# Patient Record
Sex: Female | Born: 1965 | Race: White | Hispanic: No | Smoking: Former smoker
Health system: Southern US, Community
[De-identification: ages and names within clinical notes are randomized; demographics above are authoritative.]

## PROBLEM LIST (undated history)

## (undated) DIAGNOSIS — I219 Acute myocardial infarction, unspecified: Secondary | ICD-10-CM

## (undated) DIAGNOSIS — R519 Headache, unspecified: Secondary | ICD-10-CM

## (undated) DIAGNOSIS — F32A Depression, unspecified: Secondary | ICD-10-CM

## (undated) DIAGNOSIS — Z992 Dependence on renal dialysis: Secondary | ICD-10-CM

## (undated) DIAGNOSIS — B3781 Candidal esophagitis: Secondary | ICD-10-CM

## (undated) DIAGNOSIS — I639 Cerebral infarction, unspecified: Secondary | ICD-10-CM

## (undated) DIAGNOSIS — T380X5A Adverse effect of glucocorticoids and synthetic analogues, initial encounter: Secondary | ICD-10-CM

## (undated) DIAGNOSIS — I2510 Atherosclerotic heart disease of native coronary artery without angina pectoris: Secondary | ICD-10-CM

## (undated) DIAGNOSIS — I10 Essential (primary) hypertension: Secondary | ICD-10-CM

## (undated) DIAGNOSIS — K219 Gastro-esophageal reflux disease without esophagitis: Secondary | ICD-10-CM

## (undated) DIAGNOSIS — F329 Major depressive disorder, single episode, unspecified: Secondary | ICD-10-CM

## (undated) DIAGNOSIS — J189 Pneumonia, unspecified organism: Secondary | ICD-10-CM

## (undated) DIAGNOSIS — T7840XA Allergy, unspecified, initial encounter: Secondary | ICD-10-CM

## (undated) DIAGNOSIS — I2699 Other pulmonary embolism without acute cor pulmonale: Secondary | ICD-10-CM

## (undated) DIAGNOSIS — E099 Drug or chemical induced diabetes mellitus without complications: Secondary | ICD-10-CM

## (undated) DIAGNOSIS — Z87442 Personal history of urinary calculi: Secondary | ICD-10-CM

## (undated) DIAGNOSIS — D693 Immune thrombocytopenic purpura: Secondary | ICD-10-CM

## (undated) DIAGNOSIS — F419 Anxiety disorder, unspecified: Secondary | ICD-10-CM

## (undated) DIAGNOSIS — K649 Unspecified hemorrhoids: Secondary | ICD-10-CM

## (undated) DIAGNOSIS — M109 Gout, unspecified: Secondary | ICD-10-CM

## (undated) DIAGNOSIS — R51 Headache: Secondary | ICD-10-CM

## (undated) DIAGNOSIS — B370 Candidal stomatitis: Secondary | ICD-10-CM

## (undated) DIAGNOSIS — D126 Benign neoplasm of colon, unspecified: Secondary | ICD-10-CM

## (undated) DIAGNOSIS — R079 Chest pain, unspecified: Secondary | ICD-10-CM

## (undated) DIAGNOSIS — N186 End stage renal disease: Secondary | ICD-10-CM

## (undated) DIAGNOSIS — S37019A Minor contusion of unspecified kidney, initial encounter: Secondary | ICD-10-CM

## (undated) DIAGNOSIS — J45909 Unspecified asthma, uncomplicated: Secondary | ICD-10-CM

## (undated) DIAGNOSIS — N289 Disorder of kidney and ureter, unspecified: Secondary | ICD-10-CM

## (undated) DIAGNOSIS — M1990 Unspecified osteoarthritis, unspecified site: Secondary | ICD-10-CM

## (undated) DIAGNOSIS — I5042 Chronic combined systolic (congestive) and diastolic (congestive) heart failure: Secondary | ICD-10-CM

## (undated) HISTORY — DX: Immune thrombocytopenic purpura: D69.3

## (undated) HISTORY — DX: Disorder of kidney and ureter, unspecified: N28.9

## (undated) HISTORY — DX: Minor contusion of unspecified kidney, initial encounter: S37.019A

## (undated) HISTORY — PX: CARDIAC CATHETERIZATION: SHX172

## (undated) HISTORY — DX: Gout, unspecified: M10.9

## (undated) HISTORY — PX: CATARACT EXTRACTION: SUR2

## (undated) HISTORY — DX: Essential (primary) hypertension: I10

## (undated) HISTORY — DX: Depression, unspecified: F32.A

## (undated) HISTORY — PX: CARPAL TUNNEL RELEASE: SHX101

## (undated) HISTORY — DX: Cerebral infarction, unspecified: I63.9

## (undated) HISTORY — DX: Candidal stomatitis: B37.0

## (undated) HISTORY — DX: Gastro-esophageal reflux disease without esophagitis: K21.9

## (undated) HISTORY — DX: Candidal esophagitis: B37.81

## (undated) HISTORY — DX: Unspecified asthma, uncomplicated: J45.909

## (undated) HISTORY — DX: Allergy, unspecified, initial encounter: T78.40XA

## (undated) HISTORY — DX: Drug or chemical induced diabetes mellitus without complications: E09.9

## (undated) HISTORY — PX: BONE MARROW BIOPSY: SHX199

## (undated) HISTORY — DX: Major depressive disorder, single episode, unspecified: F32.9

## (undated) HISTORY — DX: Unspecified hemorrhoids: K64.9

## (undated) HISTORY — DX: Adverse effect of glucocorticoids and synthetic analogues, initial encounter: T38.0X5A

## (undated) HISTORY — DX: Chronic combined systolic (congestive) and diastolic (congestive) heart failure: I50.42

## (undated) HISTORY — DX: Unspecified osteoarthritis, unspecified site: M19.90

## (undated) HISTORY — DX: Benign neoplasm of colon, unspecified: D12.6

## (undated) SURGERY — Surgical Case
Anesthesia: *Unknown

---

## 2003-01-01 ENCOUNTER — Encounter: Payer: Self-pay | Admitting: Family Medicine

## 2003-01-01 ENCOUNTER — Ambulatory Visit (HOSPITAL_COMMUNITY): Admission: RE | Admit: 2003-01-01 | Discharge: 2003-01-01 | Payer: Self-pay | Admitting: Family Medicine

## 2006-09-28 ENCOUNTER — Emergency Department (HOSPITAL_COMMUNITY): Admission: EM | Admit: 2006-09-28 | Discharge: 2006-09-28 | Payer: Self-pay | Admitting: Emergency Medicine

## 2006-09-28 HISTORY — PX: CHOLECYSTECTOMY: SHX55

## 2006-10-21 ENCOUNTER — Inpatient Hospital Stay (HOSPITAL_COMMUNITY): Admission: EM | Admit: 2006-10-21 | Discharge: 2006-10-23 | Payer: Self-pay | Admitting: Emergency Medicine

## 2006-10-22 ENCOUNTER — Encounter (INDEPENDENT_AMBULATORY_CARE_PROVIDER_SITE_OTHER): Payer: Self-pay | Admitting: *Deleted

## 2008-03-05 IMAGING — CR DG CHEST 2V
2 series · 2 of 2 positions shown · non-contrast
Comparison: none

CLINICAL DATA: Cough. 
 CHEST ? 2 VIEW:

[view not recorded (1 of 2)]
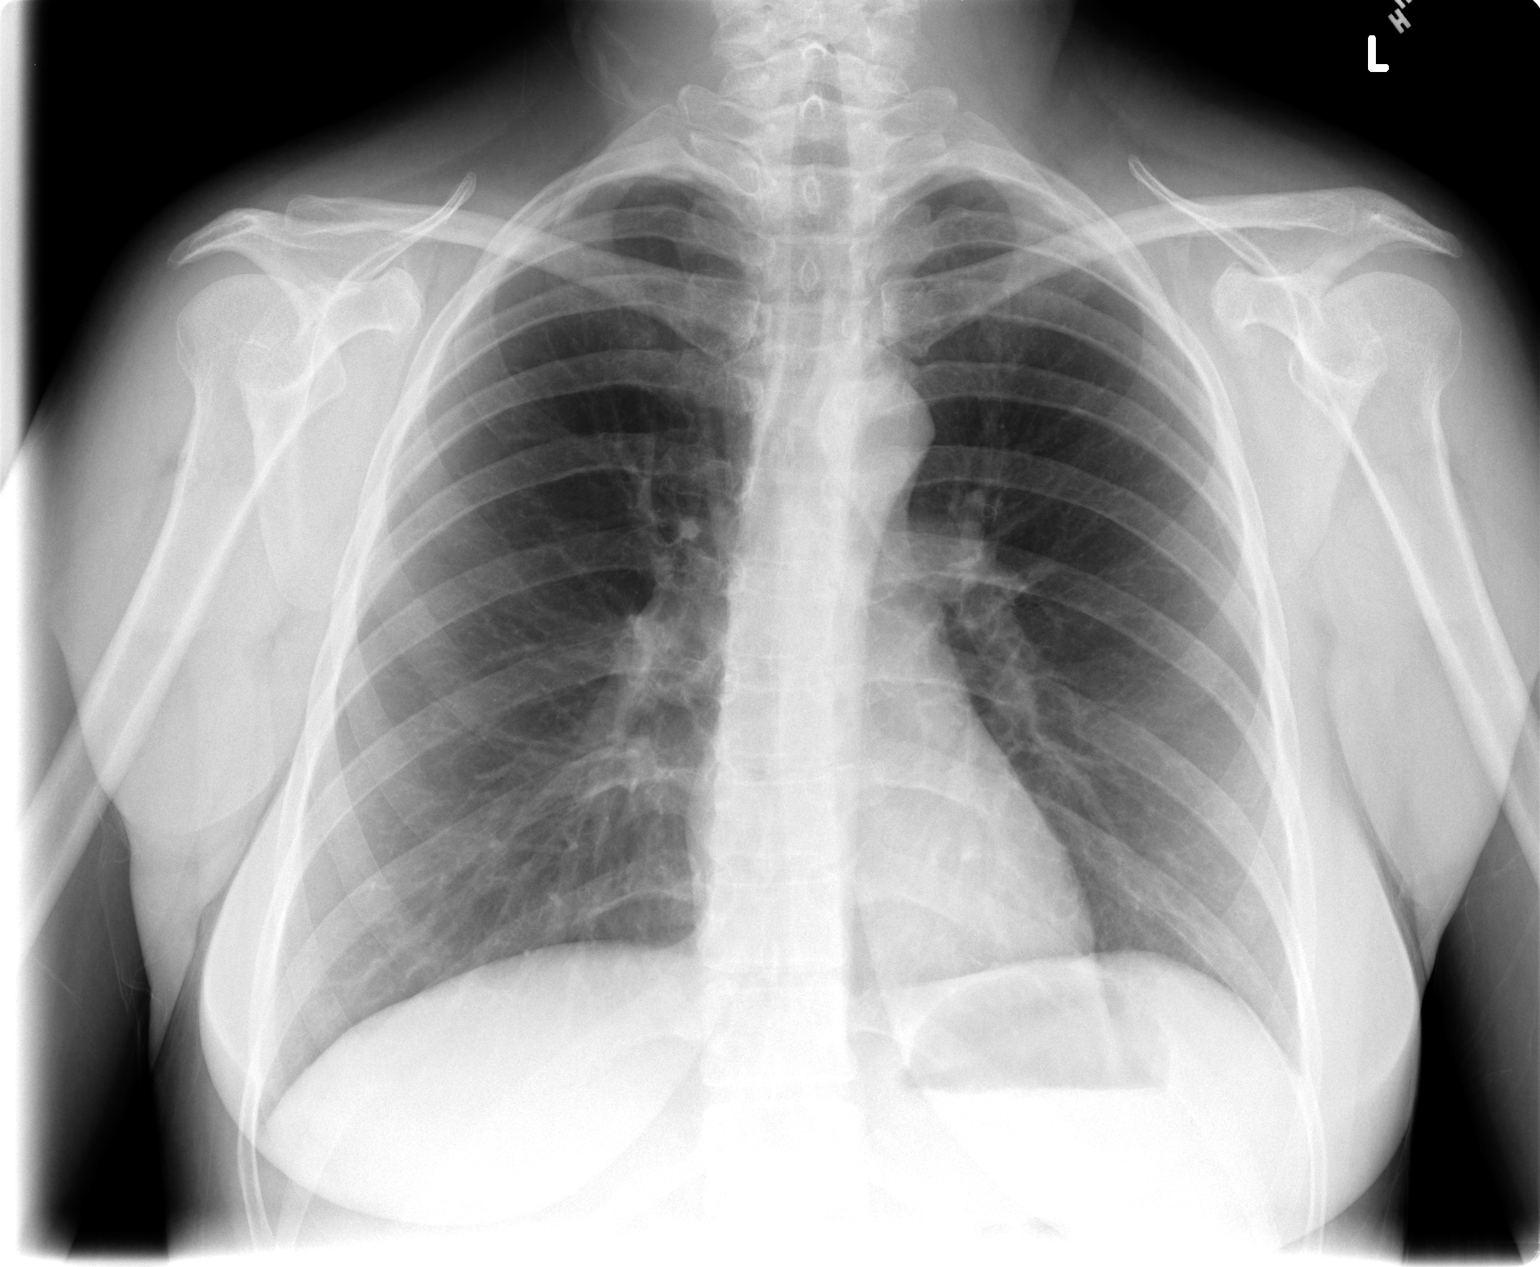

[view not recorded (2 of 2)]
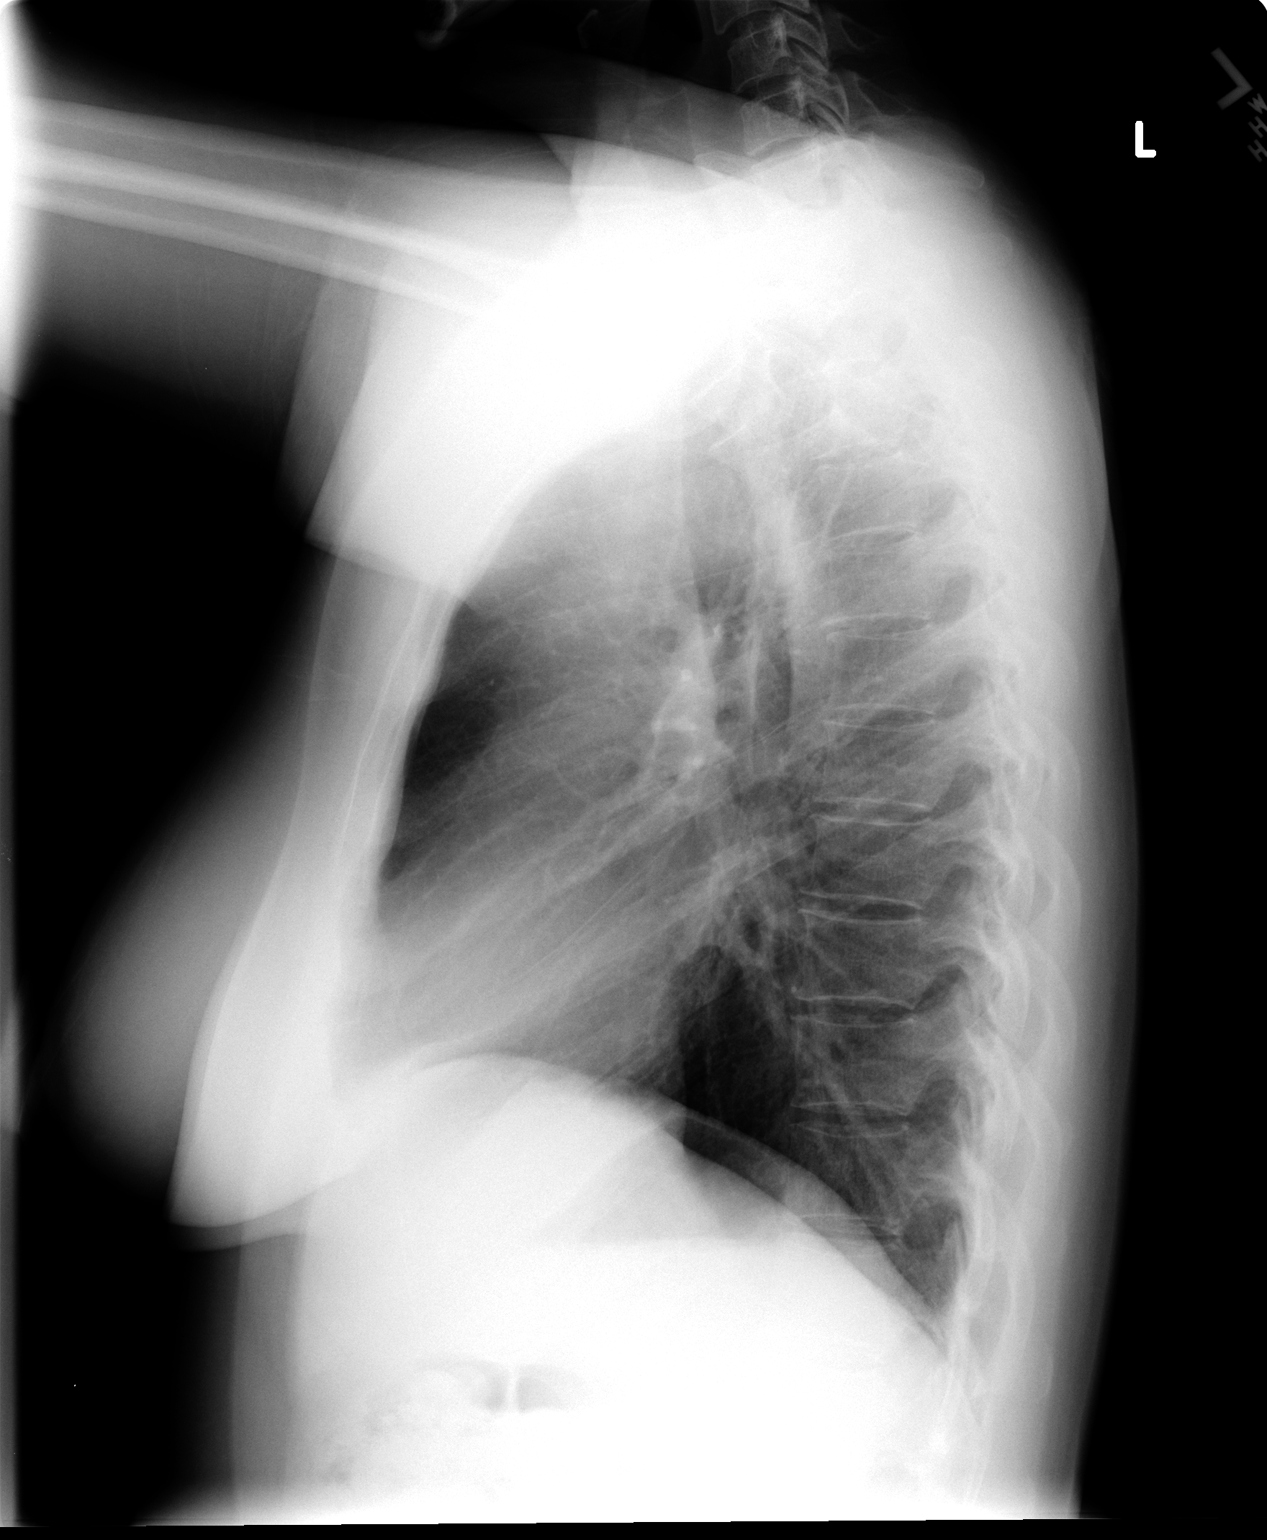

[2 of 2 positions shown; findings below may reference images not displayed]

FINDINGS: The heart size and mediastinal contours are within normal limits.  Both lungs are clear.  The visualized skeletal structures are unremarkable.
IMPRESSION: No active cardiopulmonary disease.

## 2008-03-28 IMAGING — US US ABDOMEN COMPLETE
1 series · 14 of 25 positions shown · non-contrast
Comparison: none

CLINICAL DATA: Right-sided abdominal pain. 
ABDOMEN ULTRASOUND:
TECHNIQUE: Complete abdominal ultrasound examination was performed including evaluation of the liver, gallbladder, bile ducts, pancreas, kidneys, spleen, IVC, and abdominal aorta.

[Series 1: unknown · 0.34mm/px · 14 of 73 slices shown]
[im 1/73]
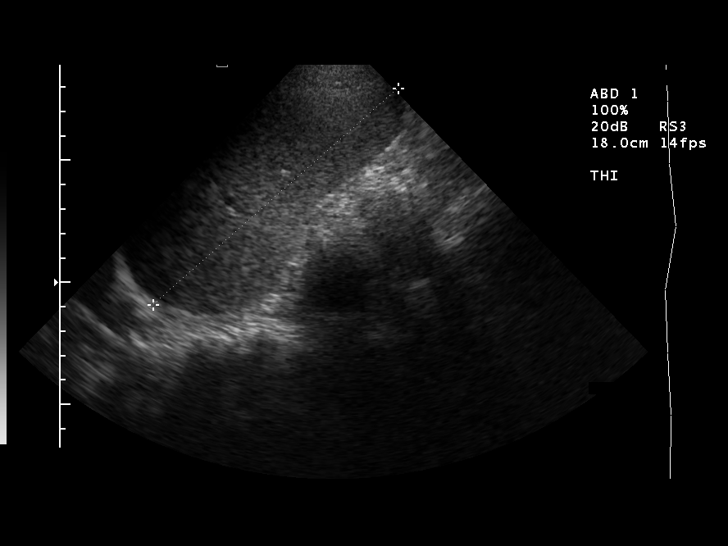
[im 7/73]
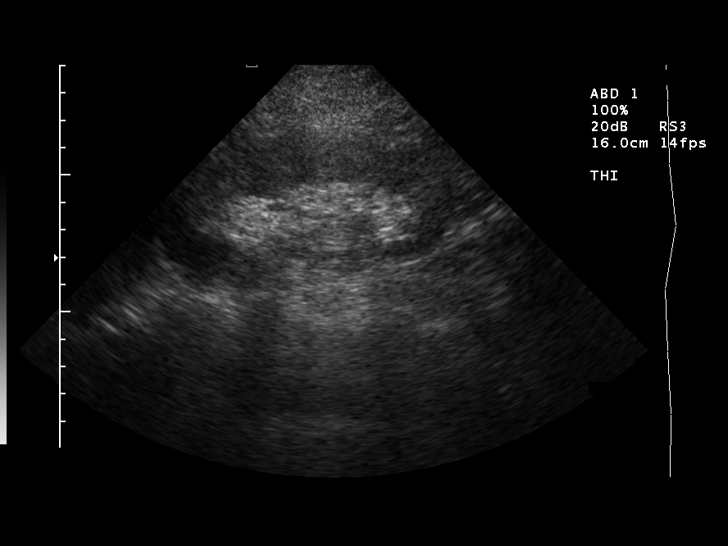
[im 13/73]
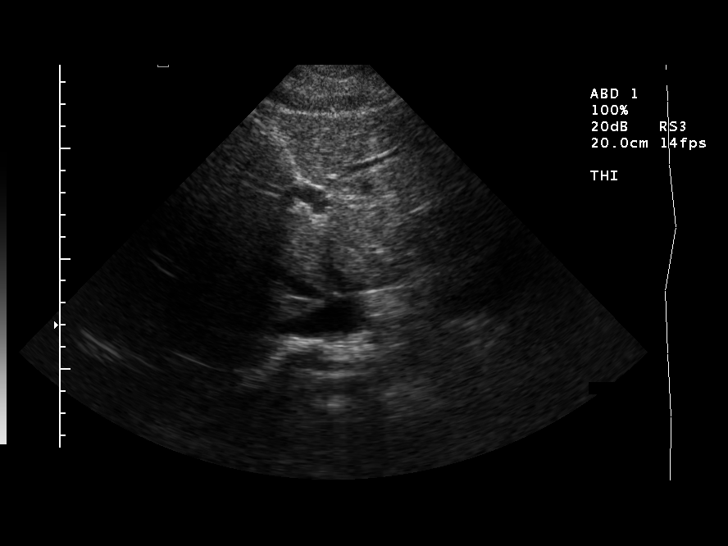
[im 19/73]
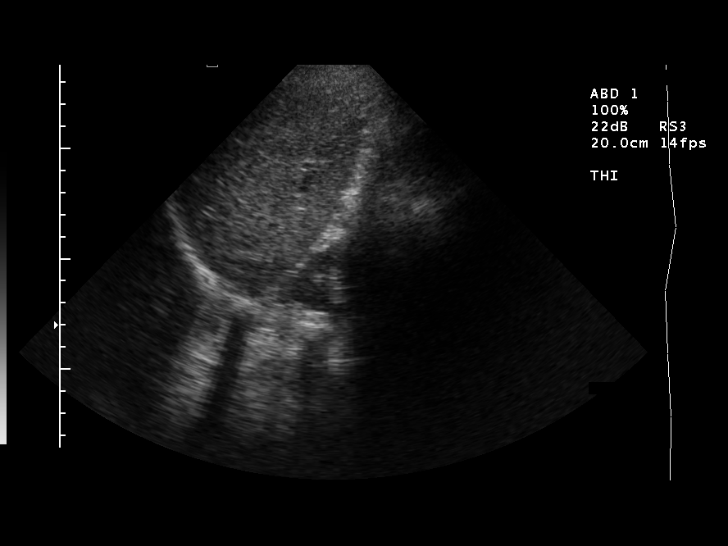
[im 25/73]
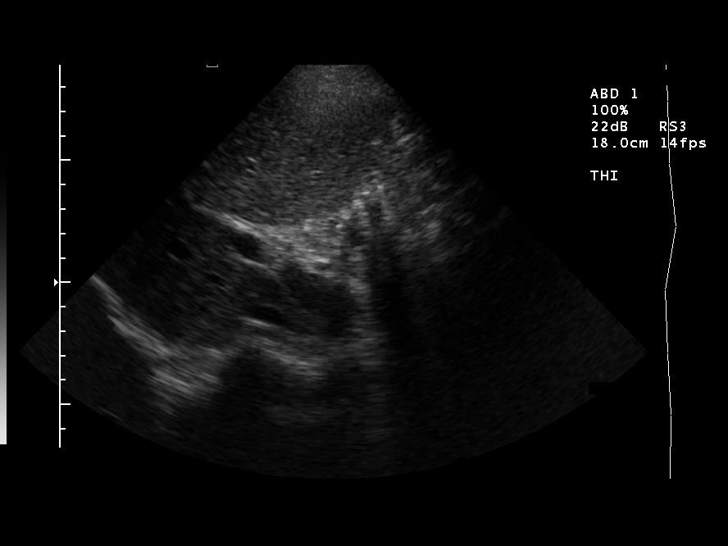
[im 28/73]
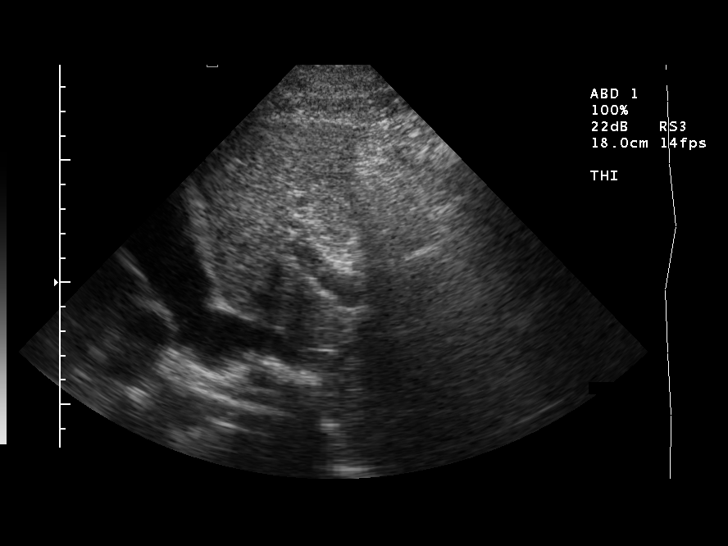
[im 34/73]
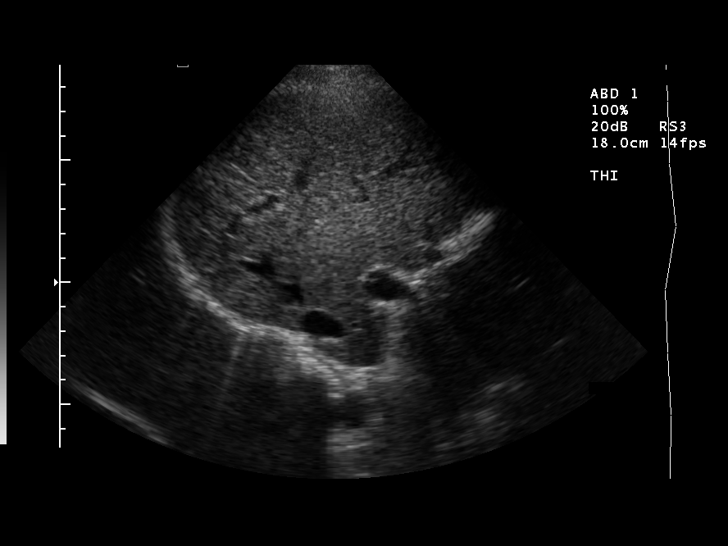
[im 40/73]
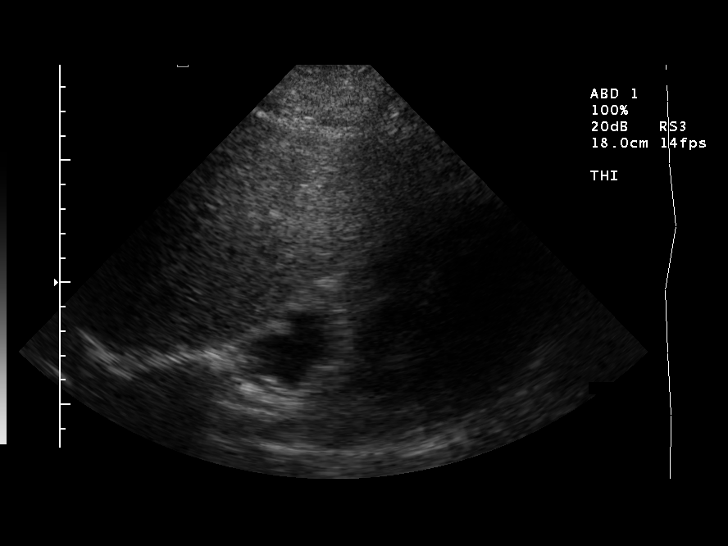
[im 46/73]
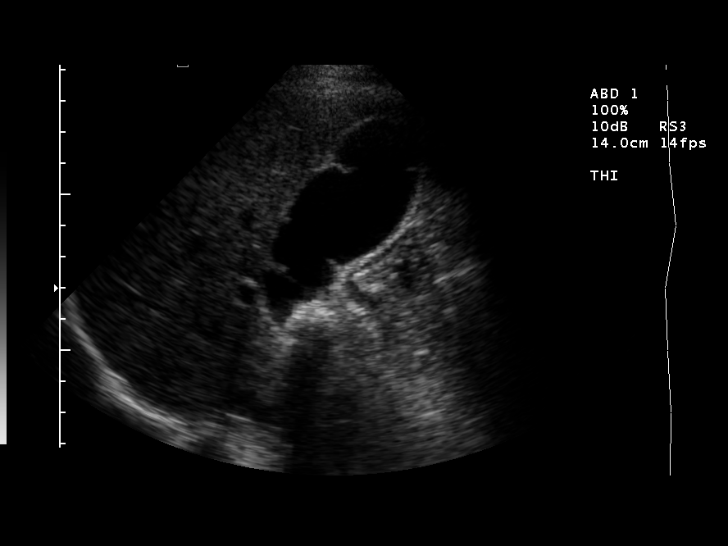
[im 49/73]
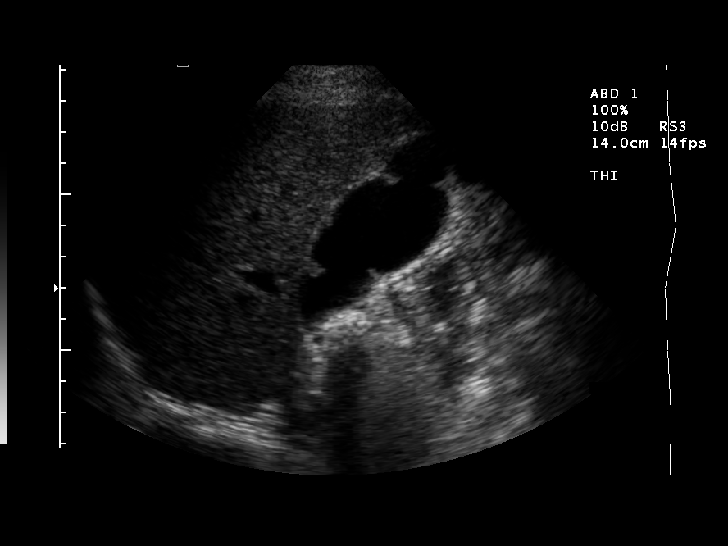
[im 55/73]
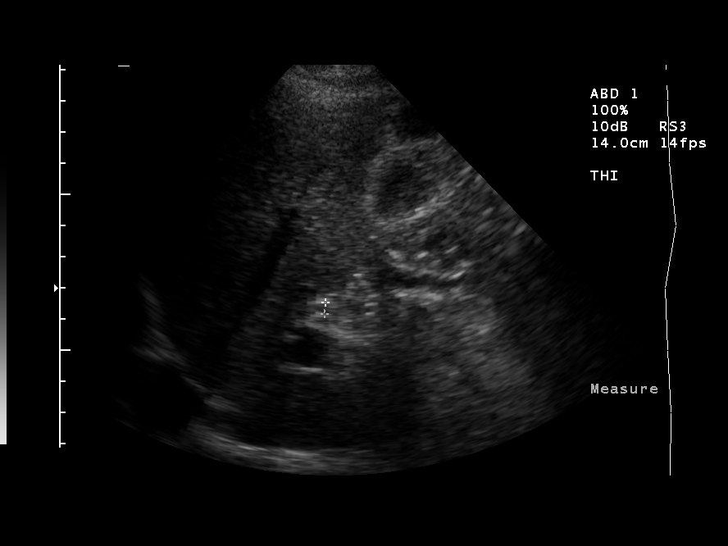
[im 61/73]
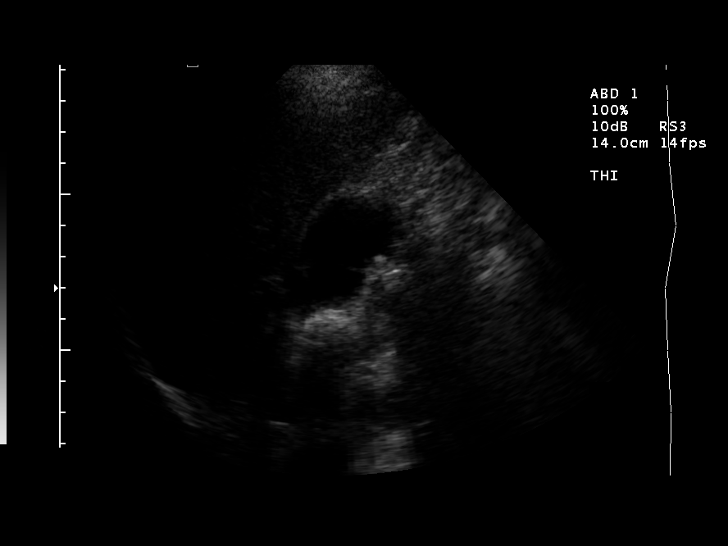
[im 67/73]
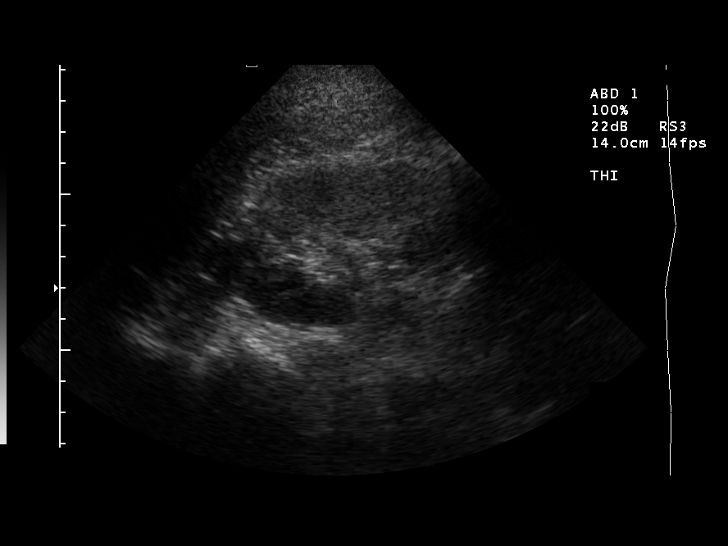
[im 73/73]
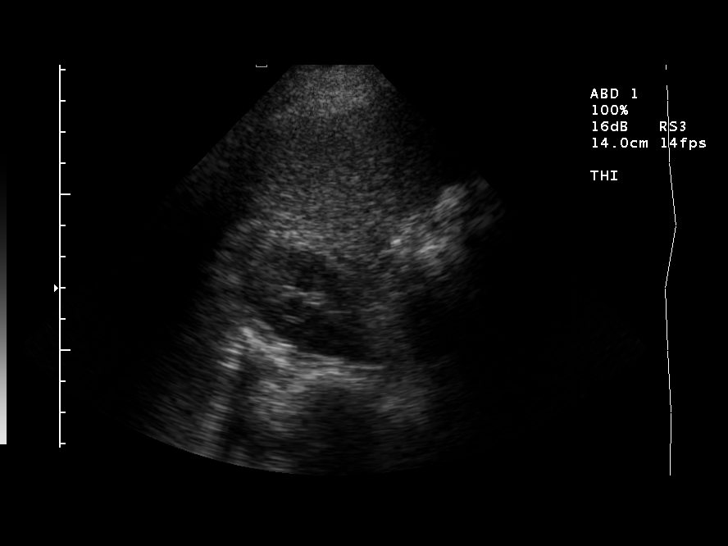

[14 of 25 positions shown; findings below may reference images not displayed]

FINDINGS: The liver has a normal appearance without focal lesions or biliary ductal dilatation.  There is a gallstone in the neck of the gallbladder but the gallbladder wall thickness is normal at 2.6 mm and the common duct is normal at 3.7 mm.  The spleen is slightly enlarged at 13.4 cm.  The pancreas is not seen due to overlying gas.  The kidneys are normal with the right measuring 10.4 cm and the left measuring 11.1 cm.  The aorta is normal at 1.9 cm.  No free fluid is seen.
IMPRESSION: 1.  18 mm gallstone in the gallbladder neck but no sign of gallbladder wall thickening or pericholecystic fluid.
2.  The spleen is slightly enlarged.

## 2008-12-14 ENCOUNTER — Emergency Department (HOSPITAL_COMMUNITY): Admission: EM | Admit: 2008-12-14 | Discharge: 2008-12-14 | Payer: Self-pay | Admitting: Emergency Medicine

## 2008-12-16 ENCOUNTER — Emergency Department (HOSPITAL_COMMUNITY): Admission: EM | Admit: 2008-12-16 | Discharge: 2008-12-16 | Payer: Self-pay | Admitting: Emergency Medicine

## 2009-03-12 ENCOUNTER — Emergency Department (HOSPITAL_COMMUNITY): Admission: EM | Admit: 2009-03-12 | Discharge: 2009-03-12 | Payer: Self-pay | Admitting: Emergency Medicine

## 2009-04-16 ENCOUNTER — Other Ambulatory Visit: Admission: RE | Admit: 2009-04-16 | Discharge: 2009-04-16 | Payer: Self-pay | Admitting: Obstetrics and Gynecology

## 2009-04-21 ENCOUNTER — Emergency Department (HOSPITAL_COMMUNITY): Admission: EM | Admit: 2009-04-21 | Discharge: 2009-04-21 | Payer: Self-pay | Admitting: Emergency Medicine

## 2010-06-25 ENCOUNTER — Other Ambulatory Visit: Admission: RE | Admit: 2010-06-25 | Discharge: 2010-06-25 | Payer: Self-pay | Admitting: Obstetrics and Gynecology

## 2010-08-28 DIAGNOSIS — D693 Immune thrombocytopenic purpura: Secondary | ICD-10-CM

## 2010-08-28 HISTORY — DX: Immune thrombocytopenic purpura: D69.3

## 2010-08-29 ENCOUNTER — Encounter (HOSPITAL_COMMUNITY)
Admission: RE | Admit: 2010-08-29 | Discharge: 2010-09-28 | Payer: Self-pay | Source: Home / Self Care | Attending: Oncology | Admitting: Oncology

## 2010-08-29 ENCOUNTER — Ambulatory Visit (HOSPITAL_COMMUNITY): Payer: Self-pay | Admitting: Oncology

## 2010-09-30 ENCOUNTER — Encounter (HOSPITAL_COMMUNITY)
Admission: RE | Admit: 2010-09-30 | Discharge: 2010-10-28 | Payer: Self-pay | Source: Home / Self Care | Attending: Oncology | Admitting: Oncology

## 2010-10-13 LAB — BASIC METABOLIC PANEL
BUN: 15 mg/dL (ref 6–23)
CO2: 29 mEq/L (ref 19–32)
Calcium: 8.9 mg/dL (ref 8.4–10.5)
Chloride: 103 mEq/L (ref 96–112)
Creatinine, Ser: 0.78 mg/dL (ref 0.4–1.2)
GFR calc Af Amer: >60 mL/min (ref >60)
GFR calc non Af Amer: >60 mL/min (ref >60)
Glucose, Bld: 78 mg/dL (ref 70–99)
Potassium: 4.5 mEq/L (ref 3.5–5.1)
Sodium: 140 mEq/L (ref 135–145)

## 2010-10-13 LAB — CBC
HCT: 41.1 % (ref 36.0–46.0)
Hemoglobin: 13.2 g/dL (ref 12.0–15.0)
MCH: 24.3 pg — ABNORMAL LOW (ref 26.0–34.0)
MCHC: 32.1 g/dL (ref 30.0–36.0)
MCV: 75.7 fL — ABNORMAL LOW (ref 78.0–100.0)
Platelets: 91 10*3/uL — ABNORMAL LOW (ref 150–400)
RBC: 5.43 MIL/uL — ABNORMAL HIGH (ref 3.87–5.11)
RDW: 24.3 % — ABNORMAL HIGH (ref 11.5–15.5)
WBC: 10.8 10*3/uL — ABNORMAL HIGH (ref 4.0–10.5)

## 2010-10-28 ENCOUNTER — Ambulatory Visit (HOSPITAL_COMMUNITY)
Admission: RE | Admit: 2010-10-28 | Discharge: 2010-10-28 | Payer: Self-pay | Source: Home / Self Care | Attending: Oncology | Admitting: Oncology

## 2010-10-28 LAB — CBC
HCT: 43.8 % (ref 36.0–46.0)
Hemoglobin: 14.6 g/dL (ref 12.0–15.0)
MCH: 26 pg (ref 26.0–34.0)
MCHC: 33.3 g/dL (ref 30.0–36.0)
MCV: 77.9 fL — ABNORMAL LOW (ref 78.0–100.0)
Platelets: 26 10*3/uL — CL (ref 150–400)
RBC: 5.62 MIL/uL — ABNORMAL HIGH (ref 3.87–5.11)
RDW: 22.8 % — ABNORMAL HIGH (ref 11.5–15.5)
WBC: 9.6 10*3/uL (ref 4.0–10.5)

## 2010-10-29 ENCOUNTER — Encounter (HOSPITAL_COMMUNITY): Payer: BC Managed Care – PPO

## 2010-10-29 ENCOUNTER — Ambulatory Visit (HOSPITAL_COMMUNITY): Payer: Self-pay | Admitting: *Deleted

## 2010-10-29 ENCOUNTER — Other Ambulatory Visit (HOSPITAL_COMMUNITY): Payer: Self-pay | Admitting: Oncology

## 2010-10-29 ENCOUNTER — Ambulatory Visit (HOSPITAL_COMMUNITY): Payer: BC Managed Care – PPO | Admitting: Oncology

## 2010-10-29 DIAGNOSIS — Z23 Encounter for immunization: Secondary | ICD-10-CM

## 2010-10-29 DIAGNOSIS — D693 Immune thrombocytopenic purpura: Secondary | ICD-10-CM

## 2010-10-29 DIAGNOSIS — D696 Thrombocytopenia, unspecified: Secondary | ICD-10-CM | POA: Insufficient documentation

## 2010-10-29 DIAGNOSIS — D509 Iron deficiency anemia, unspecified: Secondary | ICD-10-CM | POA: Insufficient documentation

## 2010-10-29 LAB — HEPATITIS B SURFACE ANTIBODY,QUALITATIVE: Hep B S Ab: NEGATIVE

## 2010-10-29 LAB — HEPATITIS B SURFACE ANTIGEN: Hepatitis B Surface Ag: NEGATIVE

## 2010-11-04 ENCOUNTER — Ambulatory Visit (HOSPITAL_COMMUNITY): Payer: BC Managed Care – PPO

## 2010-11-04 DIAGNOSIS — D61818 Other pancytopenia: Secondary | ICD-10-CM

## 2010-11-05 ENCOUNTER — Ambulatory Visit (HOSPITAL_COMMUNITY): Payer: BC Managed Care – PPO

## 2010-11-05 DIAGNOSIS — D693 Immune thrombocytopenic purpura: Secondary | ICD-10-CM

## 2010-11-05 DIAGNOSIS — D61818 Other pancytopenia: Secondary | ICD-10-CM

## 2010-11-06 ENCOUNTER — Ambulatory Visit (HOSPITAL_COMMUNITY): Payer: BC Managed Care – PPO

## 2010-11-12 ENCOUNTER — Other Ambulatory Visit (HOSPITAL_COMMUNITY): Payer: BC Managed Care – PPO

## 2010-11-12 ENCOUNTER — Encounter (HOSPITAL_COMMUNITY): Payer: BC Managed Care – PPO | Attending: Oncology

## 2010-11-12 DIAGNOSIS — D649 Anemia, unspecified: Secondary | ICD-10-CM

## 2010-11-19 ENCOUNTER — Other Ambulatory Visit (HOSPITAL_COMMUNITY): Payer: Self-pay | Admitting: Oncology

## 2010-11-19 ENCOUNTER — Encounter (HOSPITAL_COMMUNITY): Payer: BC Managed Care – PPO

## 2010-11-19 ENCOUNTER — Other Ambulatory Visit (HOSPITAL_COMMUNITY): Payer: BC Managed Care – PPO

## 2010-11-19 DIAGNOSIS — D61818 Other pancytopenia: Secondary | ICD-10-CM

## 2010-11-26 ENCOUNTER — Encounter (HOSPITAL_COMMUNITY): Payer: BC Managed Care – PPO | Attending: Oncology

## 2010-11-26 ENCOUNTER — Other Ambulatory Visit (HOSPITAL_COMMUNITY): Payer: BC Managed Care – PPO

## 2010-11-26 ENCOUNTER — Ambulatory Visit (HOSPITAL_COMMUNITY): Payer: BC Managed Care – PPO | Admitting: Oncology

## 2010-11-26 DIAGNOSIS — D693 Immune thrombocytopenic purpura: Secondary | ICD-10-CM

## 2010-11-26 DIAGNOSIS — D473 Essential (hemorrhagic) thrombocythemia: Secondary | ICD-10-CM | POA: Insufficient documentation

## 2010-11-28 LAB — CBC
HCT: 35.3 % — ABNORMAL LOW (ref 36.0–46.0)
Hemoglobin: 11.9 g/dL — ABNORMAL LOW (ref 12.0–15.0)
MCH: 28.5 pg (ref 26.0–34.0)
MCHC: 33.7 g/dL (ref 30.0–36.0)
MCV: 84.4 fL (ref 78.0–100.0)
Platelets: 62 10*3/uL — ABNORMAL LOW (ref 150–400)
RBC: 4.18 MIL/uL (ref 3.87–5.11)
RDW: 23.9 % — ABNORMAL HIGH (ref 11.5–15.5)
WBC: 5.7 10*3/uL (ref 4.0–10.5)

## 2010-12-03 ENCOUNTER — Other Ambulatory Visit (HOSPITAL_COMMUNITY): Payer: BC Managed Care – PPO

## 2010-12-03 ENCOUNTER — Encounter (HOSPITAL_COMMUNITY): Payer: BC Managed Care – PPO | Attending: Oncology

## 2010-12-03 DIAGNOSIS — D473 Essential (hemorrhagic) thrombocythemia: Secondary | ICD-10-CM | POA: Insufficient documentation

## 2010-12-03 DIAGNOSIS — D649 Anemia, unspecified: Secondary | ICD-10-CM

## 2010-12-04 ENCOUNTER — Ambulatory Visit (HOSPITAL_COMMUNITY): Payer: BC Managed Care – PPO | Admitting: Oncology

## 2010-12-04 DIAGNOSIS — D693 Immune thrombocytopenic purpura: Secondary | ICD-10-CM

## 2010-12-04 DIAGNOSIS — D509 Iron deficiency anemia, unspecified: Secondary | ICD-10-CM

## 2010-12-08 LAB — DIFFERENTIAL
Basophils Absolute: 0 10*3/uL (ref 0.0–0.1)
Basophils Absolute: 0 10*3/uL (ref 0.0–0.1)
Basophils Absolute: 0 10*3/uL (ref 0.0–0.1)
Basophils Absolute: 0 10*3/uL (ref 0.0–0.1)
Basophils Relative: 0 % (ref 0–1)
Basophils Relative: 1 % (ref 0–1)
Basophils Relative: 1 % (ref 0–1)
Basophils Relative: 1 % (ref 0–1)
Eosinophils Absolute: 0 10*3/uL (ref 0.0–0.7)
Eosinophils Absolute: 0 10*3/uL (ref 0.0–0.7)
Eosinophils Absolute: 0.1 10*3/uL (ref 0.0–0.7)
Eosinophils Absolute: 0.1 10*3/uL (ref 0.0–0.7)
Eosinophils Relative: 0 % (ref 0–5)
Eosinophils Relative: 1 % (ref 0–5)
Eosinophils Relative: 1 % (ref 0–5)
Eosinophils Relative: 1 % (ref 0–5)
Lymphocytes Relative: 13 % (ref 12–46)
Lymphocytes Relative: 20 % (ref 12–46)
Lymphocytes Relative: 21 % (ref 12–46)
Lymphocytes Relative: 4 % — ABNORMAL LOW (ref 12–46)
Lymphs Abs: 0.3 10*3/uL — ABNORMAL LOW (ref 0.7–4.0)
Lymphs Abs: 0.7 10*3/uL (ref 0.7–4.0)
Lymphs Abs: 0.7 10*3/uL (ref 0.7–4.0)
Lymphs Abs: 1 10*3/uL (ref 0.7–4.0)
Monocytes Absolute: 0.3 10*3/uL (ref 0.1–1.0)
Monocytes Absolute: 0.4 10*3/uL (ref 0.1–1.0)
Monocytes Absolute: 0.4 10*3/uL (ref 0.1–1.0)
Monocytes Absolute: 0.5 10*3/uL (ref 0.1–1.0)
Monocytes Relative: 6 % (ref 3–12)
Monocytes Relative: 7 % (ref 3–12)
Monocytes Relative: 8 % (ref 3–12)
Monocytes Relative: 9 % (ref 3–12)
Neutro Abs: 2.5 10*3/uL (ref 1.7–7.7)
Neutro Abs: 3.4 10*3/uL (ref 1.7–7.7)
Neutro Abs: 4.1 10*3/uL (ref 1.7–7.7)
Neutro Abs: 7 10*3/uL (ref 1.7–7.7)
Neutrophils Relative %: 69 % (ref 43–77)
Neutrophils Relative %: 69 % (ref 43–77)
Neutrophils Relative %: 78 % — ABNORMAL HIGH (ref 43–77)
Neutrophils Relative %: 90 % — ABNORMAL HIGH (ref 43–77)
Smear Review: DECREASED
Smear Review: DECREASED

## 2010-12-08 LAB — CBC
HCT: 31.6 % — ABNORMAL LOW (ref 36.0–46.0)
HCT: 33.7 % — ABNORMAL LOW (ref 36.0–46.0)
HCT: 36.6 % (ref 36.0–46.0)
HCT: 37 % (ref 36.0–46.0)
Hemoglobin: 10.4 g/dL — ABNORMAL LOW (ref 12.0–15.0)
Hemoglobin: 12 g/dL (ref 12.0–15.0)
Hemoglobin: 12.1 g/dL (ref 12.0–15.0)
Hemoglobin: 9.7 g/dL — ABNORMAL LOW (ref 12.0–15.0)
MCH: 20.7 pg — ABNORMAL LOW (ref 26.0–34.0)
MCH: 21.3 pg — ABNORMAL LOW (ref 26.0–34.0)
MCH: 23.8 pg — ABNORMAL LOW (ref 26.0–34.0)
MCH: 24 pg — ABNORMAL LOW (ref 26.0–34.0)
MCHC: 30.7 g/dL (ref 30.0–36.0)
MCHC: 30.9 g/dL (ref 30.0–36.0)
MCHC: 32.7 g/dL (ref 30.0–36.0)
MCHC: 32.8 g/dL (ref 30.0–36.0)
MCV: 67.5 fL — ABNORMAL LOW (ref 78.0–100.0)
MCV: 68.9 fL — ABNORMAL LOW (ref 78.0–100.0)
MCV: 72.5 fL — ABNORMAL LOW (ref 78.0–100.0)
MCV: 73.4 fL — ABNORMAL LOW (ref 78.0–100.0)
Platelets: 10 10*3/uL — CL (ref 150–400)
Platelets: 22 10*3/uL — CL (ref 150–400)
Platelets: 28 10*3/uL — CL (ref 150–400)
Platelets: 69 10*3/uL — ABNORMAL LOW (ref 150–400)
RBC: 4.68 MIL/uL (ref 3.87–5.11)
RBC: 4.89 MIL/uL (ref 3.87–5.11)
RBC: 5.04 MIL/uL (ref 3.87–5.11)
RBC: 5.05 MIL/uL (ref 3.87–5.11)
RDW: 17.9 % — ABNORMAL HIGH (ref 11.5–15.5)
RDW: 19.6 % — ABNORMAL HIGH (ref 11.5–15.5)
RDW: 23 % — ABNORMAL HIGH (ref 11.5–15.5)
RDW: 23.4 % — ABNORMAL HIGH (ref 11.5–15.5)
WBC: 3.6 10*3/uL — ABNORMAL LOW (ref 4.0–10.5)
WBC: 4.8 10*3/uL (ref 4.0–10.5)
WBC: 5.3 10*3/uL (ref 4.0–10.5)
WBC: 7.8 10*3/uL (ref 4.0–10.5)

## 2010-12-08 LAB — HEPATITIS PANEL, ACUTE
HCV Ab: NEGATIVE
Hep A IgM: NEGATIVE
Hep B C IgM: NEGATIVE
Hepatitis B Surface Ag: NEGATIVE

## 2010-12-08 LAB — HIV ANTIBODY (ROUTINE TESTING W REFLEX): HIV: NONREACTIVE

## 2010-12-08 LAB — FERRITIN: Ferritin: 133 ng/mL (ref 10–291)

## 2010-12-08 LAB — RHEUMATOID FACTOR: Rheumatoid fact SerPl-aCnc: 54 IU/mL — ABNORMAL HIGH (ref 0–20)

## 2010-12-08 LAB — ANA: Anti Nuclear Antibody(ANA): NEGATIVE

## 2010-12-10 ENCOUNTER — Ambulatory Visit (HOSPITAL_COMMUNITY): Payer: BC Managed Care – PPO | Admitting: Oncology

## 2010-12-10 ENCOUNTER — Emergency Department (HOSPITAL_COMMUNITY): Payer: BC Managed Care – PPO

## 2010-12-10 ENCOUNTER — Emergency Department (HOSPITAL_COMMUNITY)
Admission: EM | Admit: 2010-12-10 | Discharge: 2010-12-10 | Disposition: A | Discharge status: Home / Self Care | Payer: BC Managed Care – PPO | Attending: Emergency Medicine | Admitting: Emergency Medicine

## 2010-12-10 ENCOUNTER — Encounter (HOSPITAL_COMMUNITY): Payer: BC Managed Care – PPO

## 2010-12-10 ENCOUNTER — Other Ambulatory Visit (HOSPITAL_COMMUNITY): Payer: BC Managed Care – PPO

## 2010-12-10 DIAGNOSIS — M7989 Other specified soft tissue disorders: Secondary | ICD-10-CM | POA: Insufficient documentation

## 2010-12-10 DIAGNOSIS — R51 Headache: Secondary | ICD-10-CM | POA: Insufficient documentation

## 2010-12-10 DIAGNOSIS — F3289 Other specified depressive episodes: Secondary | ICD-10-CM | POA: Insufficient documentation

## 2010-12-10 DIAGNOSIS — R0602 Shortness of breath: Secondary | ICD-10-CM | POA: Insufficient documentation

## 2010-12-10 DIAGNOSIS — D693 Immune thrombocytopenic purpura: Secondary | ICD-10-CM

## 2010-12-10 DIAGNOSIS — F329 Major depressive disorder, single episode, unspecified: Secondary | ICD-10-CM | POA: Insufficient documentation

## 2010-12-10 DIAGNOSIS — M542 Cervicalgia: Secondary | ICD-10-CM | POA: Insufficient documentation

## 2010-12-10 DIAGNOSIS — R509 Fever, unspecified: Secondary | ICD-10-CM | POA: Insufficient documentation

## 2010-12-10 DIAGNOSIS — R110 Nausea: Secondary | ICD-10-CM | POA: Insufficient documentation

## 2010-12-10 DIAGNOSIS — D473 Essential (hemorrhagic) thrombocythemia: Secondary | ICD-10-CM | POA: Insufficient documentation

## 2010-12-10 LAB — BASIC METABOLIC PANEL
BUN: 10 mg/dL (ref 6–23)
Creatinine, Ser: 0.99 mg/dL (ref 0.4–1.2)
GFR calc Af Amer: >60 mL/min (ref >60)
GFR calc non Af Amer: >60 mL/min (ref >60)
Potassium: 4.8 mEq/L (ref 3.5–5.1)

## 2010-12-10 LAB — BRAIN NATRIURETIC PEPTIDE: Pro B Natriuretic peptide (BNP): <30 pg/mL (ref 0.0–100.0)

## 2010-12-11 ENCOUNTER — Inpatient Hospital Stay (HOSPITAL_COMMUNITY): Payer: BC Managed Care – PPO

## 2010-12-11 ENCOUNTER — Encounter (HOSPITAL_COMMUNITY): Payer: BC Managed Care – PPO | Attending: Oncology

## 2010-12-11 DIAGNOSIS — D693 Immune thrombocytopenic purpura: Secondary | ICD-10-CM | POA: Insufficient documentation

## 2010-12-11 DIAGNOSIS — Z79899 Other long term (current) drug therapy: Secondary | ICD-10-CM | POA: Insufficient documentation

## 2010-12-11 DIAGNOSIS — G43909 Migraine, unspecified, not intractable, without status migrainosus: Secondary | ICD-10-CM | POA: Insufficient documentation

## 2010-12-17 ENCOUNTER — Other Ambulatory Visit (HOSPITAL_COMMUNITY): Payer: BC Managed Care – PPO

## 2010-12-17 ENCOUNTER — Encounter (HOSPITAL_COMMUNITY): Payer: BC Managed Care – PPO

## 2010-12-18 ENCOUNTER — Encounter (HOSPITAL_COMMUNITY): Payer: BC Managed Care – PPO

## 2010-12-18 ENCOUNTER — Ambulatory Visit (HOSPITAL_COMMUNITY): Payer: BC Managed Care – PPO | Admitting: Oncology

## 2010-12-18 ENCOUNTER — Inpatient Hospital Stay (HOSPITAL_COMMUNITY): Payer: BC Managed Care – PPO

## 2010-12-18 DIAGNOSIS — D693 Immune thrombocytopenic purpura: Secondary | ICD-10-CM

## 2010-12-18 DIAGNOSIS — Z5112 Encounter for antineoplastic immunotherapy: Secondary | ICD-10-CM

## 2010-12-19 ENCOUNTER — Emergency Department (HOSPITAL_COMMUNITY): Payer: BC Managed Care – PPO

## 2010-12-19 ENCOUNTER — Emergency Department (HOSPITAL_COMMUNITY)
Admission: EM | Admit: 2010-12-19 | Discharge: 2010-12-20 | Disposition: A | Discharge status: Home / Self Care | Payer: BC Managed Care – PPO | Attending: Emergency Medicine | Admitting: Emergency Medicine

## 2010-12-19 DIAGNOSIS — R51 Headache: Secondary | ICD-10-CM | POA: Insufficient documentation

## 2010-12-19 DIAGNOSIS — Z79899 Other long term (current) drug therapy: Secondary | ICD-10-CM | POA: Insufficient documentation

## 2010-12-19 DIAGNOSIS — D696 Thrombocytopenia, unspecified: Secondary | ICD-10-CM | POA: Insufficient documentation

## 2010-12-19 DIAGNOSIS — R509 Fever, unspecified: Secondary | ICD-10-CM | POA: Insufficient documentation

## 2010-12-19 LAB — BASIC METABOLIC PANEL
BUN: 9 mg/dL (ref 6–23)
Calcium: 8.7 mg/dL (ref 8.4–10.5)
GFR calc non Af Amer: >60 mL/min (ref >60)
Glucose, Bld: 92 mg/dL (ref 70–99)
Sodium: 137 mEq/L (ref 135–145)

## 2010-12-19 LAB — DIFFERENTIAL
Basophils Absolute: 0 10*3/uL (ref 0.0–0.1)
Eosinophils Absolute: 0.1 10*3/uL (ref 0.0–0.7)
Eosinophils Relative: 1 % (ref 0–5)

## 2010-12-19 LAB — CBC
Platelets: 9 10*3/uL — CL (ref 150–400)
RDW: 14.8 % (ref 11.5–15.5)
WBC: 5.8 10*3/uL (ref 4.0–10.5)

## 2010-12-19 LAB — RAPID STREP SCREEN (MED CTR MEBANE ONLY): Streptococcus, Group A Screen (Direct): NEGATIVE

## 2010-12-22 ENCOUNTER — Encounter (HOSPITAL_COMMUNITY): Payer: BC Managed Care – PPO

## 2010-12-22 ENCOUNTER — Other Ambulatory Visit (HOSPITAL_COMMUNITY): Payer: BC Managed Care – PPO

## 2010-12-22 DIAGNOSIS — D693 Immune thrombocytopenic purpura: Secondary | ICD-10-CM

## 2010-12-23 ENCOUNTER — Ambulatory Visit (HOSPITAL_COMMUNITY): Payer: BC Managed Care – PPO

## 2010-12-23 DIAGNOSIS — D693 Immune thrombocytopenic purpura: Secondary | ICD-10-CM

## 2010-12-23 DIAGNOSIS — I10 Essential (primary) hypertension: Secondary | ICD-10-CM

## 2010-12-24 ENCOUNTER — Ambulatory Visit (HOSPITAL_COMMUNITY): Payer: BC Managed Care – PPO

## 2010-12-24 ENCOUNTER — Ambulatory Visit (HOSPITAL_COMMUNITY): Payer: BC Managed Care – PPO | Admitting: Oncology

## 2010-12-24 ENCOUNTER — Other Ambulatory Visit (HOSPITAL_COMMUNITY): Payer: BC Managed Care – PPO

## 2010-12-24 ENCOUNTER — Encounter (HOSPITAL_COMMUNITY): Payer: BC Managed Care – PPO

## 2010-12-24 DIAGNOSIS — D693 Immune thrombocytopenic purpura: Secondary | ICD-10-CM

## 2010-12-25 ENCOUNTER — Inpatient Hospital Stay (HOSPITAL_COMMUNITY): Payer: BC Managed Care – PPO

## 2010-12-25 ENCOUNTER — Ambulatory Visit (HOSPITAL_COMMUNITY): Payer: BC Managed Care – PPO | Admitting: Oncology

## 2010-12-25 DIAGNOSIS — D693 Immune thrombocytopenic purpura: Secondary | ICD-10-CM

## 2010-12-25 DIAGNOSIS — Z5112 Encounter for antineoplastic immunotherapy: Secondary | ICD-10-CM

## 2010-12-29 ENCOUNTER — Other Ambulatory Visit (HOSPITAL_COMMUNITY): Payer: Self-pay | Admitting: Oncology

## 2010-12-29 ENCOUNTER — Encounter (HOSPITAL_COMMUNITY): Payer: BC Managed Care – PPO | Admitting: Oncology

## 2010-12-29 ENCOUNTER — Encounter (HOSPITAL_COMMUNITY): Payer: BC Managed Care – PPO | Attending: Oncology

## 2010-12-29 ENCOUNTER — Encounter (HOSPITAL_COMMUNITY): Payer: BC Managed Care – PPO

## 2010-12-29 DIAGNOSIS — Z79899 Other long term (current) drug therapy: Secondary | ICD-10-CM | POA: Insufficient documentation

## 2010-12-29 DIAGNOSIS — D693 Immune thrombocytopenic purpura: Secondary | ICD-10-CM

## 2010-12-29 DIAGNOSIS — D696 Thrombocytopenia, unspecified: Secondary | ICD-10-CM | POA: Insufficient documentation

## 2010-12-29 LAB — DIFFERENTIAL
Basophils Relative: 2 % — ABNORMAL HIGH (ref 0–1)
Lymphs Abs: 1.5 10*3/uL (ref 0.7–4.0)
Monocytes Relative: 9 % (ref 3–12)
Neutro Abs: 3.1 10*3/uL (ref 1.7–7.7)
Neutrophils Relative %: 60 % (ref 43–77)

## 2010-12-29 LAB — CBC
Hemoglobin: 12.3 g/dL (ref 12.0–15.0)
RBC: 4.14 MIL/uL (ref 3.87–5.11)
WBC: 5.2 10*3/uL (ref 4.0–10.5)

## 2010-12-31 ENCOUNTER — Other Ambulatory Visit (HOSPITAL_COMMUNITY): Payer: BC Managed Care – PPO

## 2011-01-01 ENCOUNTER — Other Ambulatory Visit (HOSPITAL_COMMUNITY): Payer: Self-pay | Admitting: Oncology

## 2011-01-01 ENCOUNTER — Encounter (HOSPITAL_COMMUNITY): Payer: BC Managed Care – PPO | Admitting: Oncology

## 2011-01-01 ENCOUNTER — Encounter (HOSPITAL_COMMUNITY): Payer: BC Managed Care – PPO

## 2011-01-01 DIAGNOSIS — D693 Immune thrombocytopenic purpura: Secondary | ICD-10-CM

## 2011-01-01 DIAGNOSIS — Z5112 Encounter for antineoplastic immunotherapy: Secondary | ICD-10-CM

## 2011-01-01 LAB — DIFFERENTIAL
Basophils Absolute: 0.1 10*3/uL (ref 0.0–0.1)
Basophils Relative: 2 % — ABNORMAL HIGH (ref 0–1)
Eosinophils Relative: 1 % (ref 0–5)
Lymphocytes Relative: 32 % (ref 12–46)
Monocytes Absolute: 0.4 10*3/uL (ref 0.1–1.0)
Neutro Abs: 1.9 10*3/uL (ref 1.7–7.7)

## 2011-01-01 LAB — CBC
Hemoglobin: 11.3 g/dL — ABNORMAL LOW (ref 12.0–15.0)
MCH: 29.9 pg (ref 26.0–34.0)
MCV: 85.4 fL (ref 78.0–100.0)
Platelets: 105 10*3/uL — ABNORMAL LOW (ref 150–400)
RBC: 3.78 MIL/uL — ABNORMAL LOW (ref 3.87–5.11)

## 2011-01-07 ENCOUNTER — Encounter (HOSPITAL_COMMUNITY): Payer: BC Managed Care – PPO

## 2011-01-07 ENCOUNTER — Encounter (HOSPITAL_COMMUNITY): Payer: BC Managed Care – PPO | Admitting: Oncology

## 2011-01-07 DIAGNOSIS — D693 Immune thrombocytopenic purpura: Secondary | ICD-10-CM

## 2011-01-08 LAB — CULTURE, ROUTINE-ABSCESS: Gram Stain: NONE SEEN

## 2011-01-13 ENCOUNTER — Encounter (HOSPITAL_COMMUNITY): Payer: BC Managed Care – PPO | Admitting: Oncology

## 2011-01-13 ENCOUNTER — Other Ambulatory Visit (HOSPITAL_COMMUNITY): Payer: Self-pay | Admitting: Oncology

## 2011-01-13 DIAGNOSIS — R161 Splenomegaly, not elsewhere classified: Secondary | ICD-10-CM

## 2011-01-13 DIAGNOSIS — R700 Elevated erythrocyte sedimentation rate: Secondary | ICD-10-CM

## 2011-01-13 DIAGNOSIS — D696 Thrombocytopenia, unspecified: Secondary | ICD-10-CM

## 2011-01-14 ENCOUNTER — Other Ambulatory Visit (HOSPITAL_COMMUNITY): Payer: Self-pay | Admitting: Oncology

## 2011-01-14 ENCOUNTER — Encounter (HOSPITAL_COMMUNITY): Payer: BC Managed Care – PPO | Admitting: Oncology

## 2011-01-14 ENCOUNTER — Other Ambulatory Visit (HOSPITAL_COMMUNITY)
Admission: RE | Admit: 2011-01-14 | Discharge: 2011-01-14 | Disposition: A | Discharge status: Home / Self Care | Payer: BC Managed Care – PPO | Source: Ambulatory Visit | Attending: Oncology | Admitting: Oncology

## 2011-01-14 ENCOUNTER — Encounter (HOSPITAL_COMMUNITY): Payer: BC Managed Care – PPO

## 2011-01-14 DIAGNOSIS — D696 Thrombocytopenia, unspecified: Secondary | ICD-10-CM

## 2011-01-14 DIAGNOSIS — R161 Splenomegaly, not elsewhere classified: Secondary | ICD-10-CM | POA: Insufficient documentation

## 2011-01-14 DIAGNOSIS — R799 Abnormal finding of blood chemistry, unspecified: Secondary | ICD-10-CM | POA: Insufficient documentation

## 2011-01-20 ENCOUNTER — Other Ambulatory Visit (HOSPITAL_COMMUNITY): Payer: BC Managed Care – PPO

## 2011-01-21 ENCOUNTER — Ambulatory Visit (HOSPITAL_COMMUNITY): Payer: BC Managed Care – PPO | Admitting: Oncology

## 2011-01-21 ENCOUNTER — Encounter (HOSPITAL_COMMUNITY): Payer: BC Managed Care – PPO

## 2011-01-21 DIAGNOSIS — D696 Thrombocytopenia, unspecified: Secondary | ICD-10-CM

## 2011-01-27 ENCOUNTER — Other Ambulatory Visit (HOSPITAL_COMMUNITY): Payer: Self-pay | Admitting: Oncology

## 2011-01-27 ENCOUNTER — Encounter (HOSPITAL_COMMUNITY): Payer: BC Managed Care – PPO | Attending: Oncology | Admitting: Oncology

## 2011-01-27 DIAGNOSIS — Z79899 Other long term (current) drug therapy: Secondary | ICD-10-CM | POA: Insufficient documentation

## 2011-01-27 DIAGNOSIS — R21 Rash and other nonspecific skin eruption: Secondary | ICD-10-CM

## 2011-01-27 DIAGNOSIS — D696 Thrombocytopenia, unspecified: Secondary | ICD-10-CM | POA: Insufficient documentation

## 2011-01-27 DIAGNOSIS — D693 Immune thrombocytopenic purpura: Secondary | ICD-10-CM

## 2011-01-27 HISTORY — PX: SPLENECTOMY, TOTAL: SHX788

## 2011-01-28 LAB — ANA: Anti Nuclear Antibody(ANA): POSITIVE — AB

## 2011-02-02 ENCOUNTER — Observation Stay (HOSPITAL_COMMUNITY)
Admission: AD | Admit: 2011-02-02 | Discharge: 2011-02-03 | Disposition: A | Discharge status: Home / Self Care | Payer: BC Managed Care – PPO | Source: Ambulatory Visit | Attending: Internal Medicine | Admitting: Internal Medicine

## 2011-02-02 ENCOUNTER — Emergency Department (HOSPITAL_COMMUNITY): Payer: BC Managed Care – PPO

## 2011-02-02 DIAGNOSIS — F3289 Other specified depressive episodes: Secondary | ICD-10-CM | POA: Insufficient documentation

## 2011-02-02 DIAGNOSIS — R0789 Other chest pain: Principal | ICD-10-CM | POA: Insufficient documentation

## 2011-02-02 DIAGNOSIS — D693 Immune thrombocytopenic purpura: Secondary | ICD-10-CM | POA: Insufficient documentation

## 2011-02-02 DIAGNOSIS — I10 Essential (primary) hypertension: Secondary | ICD-10-CM | POA: Insufficient documentation

## 2011-02-02 DIAGNOSIS — F329 Major depressive disorder, single episode, unspecified: Secondary | ICD-10-CM | POA: Insufficient documentation

## 2011-02-02 LAB — CBC
HCT: 39.4 % (ref 36.0–46.0)
Hemoglobin: 13.2 g/dL (ref 12.0–15.0)
MCV: 84.7 fL (ref 78.0–100.0)
RBC: 4.65 MIL/uL (ref 3.87–5.11)
RDW: 14.7 % (ref 11.5–15.5)
WBC: 3.7 10*3/uL — ABNORMAL LOW (ref 4.0–10.5)

## 2011-02-02 LAB — PROTIME-INR: INR: 0.97 (ref 0.00–1.49)

## 2011-02-02 LAB — COMPREHENSIVE METABOLIC PANEL
AST: 25 U/L (ref 0–37)
Albumin: 3.6 g/dL (ref 3.5–5.2)
Alkaline Phosphatase: 54 U/L (ref 39–117)
BUN: 21 mg/dL (ref 6–23)
GFR calc Af Amer: 57 mL/min — ABNORMAL LOW (ref >60)
Potassium: 3.8 mEq/L (ref 3.5–5.1)
Sodium: 138 mEq/L (ref 135–145)
Total Protein: 6.9 g/dL (ref 6.0–8.3)

## 2011-02-02 LAB — DIFFERENTIAL
Band Neutrophils: 0 % (ref 0–10)
Basophils Relative: 1 % (ref 0–1)
Eosinophils Relative: 2 % (ref 0–5)
Lymphocytes Relative: 23 % (ref 12–46)
Metamyelocytes Relative: 0 %
Monocytes Relative: 7 % (ref 3–12)
Neutro Abs: 2.5 10*3/uL (ref 1.7–7.7)

## 2011-02-02 LAB — POCT CARDIAC MARKERS
CKMB, poc: <1 ng/mL — ABNORMAL LOW (ref 1.0–8.0)
Troponin i, poc: <0.05 ng/mL (ref 0.00–0.09)

## 2011-02-02 LAB — CK TOTAL AND CKMB (NOT AT ARMC): Total CK: 37 U/L (ref 7–177)

## 2011-02-02 LAB — APTT: aPTT: 29 seconds (ref 24–37)

## 2011-02-03 ENCOUNTER — Observation Stay (HOSPITAL_COMMUNITY): Payer: BC Managed Care – PPO

## 2011-02-03 ENCOUNTER — Other Ambulatory Visit (HOSPITAL_COMMUNITY): Payer: BC Managed Care – PPO

## 2011-02-03 ENCOUNTER — Encounter (HOSPITAL_COMMUNITY): Payer: Self-pay | Admitting: Radiology

## 2011-02-03 DIAGNOSIS — R072 Precordial pain: Secondary | ICD-10-CM

## 2011-02-03 LAB — CARDIAC PANEL(CRET KIN+CKTOT+MB+TROPI)
CK, MB: 0.9 ng/mL (ref 0.3–4.0)
Relative Index: INVALID (ref 0.0–2.5)
Troponin I: <0.3 ng/mL (ref <0.30)
Troponin I: <0.3 ng/mL (ref <0.30)

## 2011-02-03 LAB — CBC
HCT: 35.8 % — ABNORMAL LOW (ref 36.0–46.0)
Hemoglobin: 11.9 g/dL — ABNORMAL LOW (ref 12.0–15.0)
MCH: 28.7 pg (ref 26.0–34.0)
MCHC: 33.2 g/dL (ref 30.0–36.0)

## 2011-02-03 LAB — BASIC METABOLIC PANEL
BUN: 21 mg/dL (ref 6–23)
GFR calc non Af Amer: 53 mL/min — ABNORMAL LOW (ref >60)
Glucose, Bld: 94 mg/dL (ref 70–99)
Potassium: 4.5 mEq/L (ref 3.5–5.1)

## 2011-02-03 LAB — LIPID PANEL
Cholesterol: 169 mg/dL (ref 0–200)
HDL: 45 mg/dL (ref >39)
LDL Cholesterol: 104 mg/dL — ABNORMAL HIGH (ref 0–99)
Triglycerides: 98 mg/dL (ref <150)

## 2011-02-03 LAB — SEDIMENTATION RATE: Sed Rate: 31 mm/hr — ABNORMAL HIGH (ref 0–22)

## 2011-02-03 LAB — DIFFERENTIAL
Basophils Absolute: 0 10*3/uL (ref 0.0–0.1)
Eosinophils Relative: 2 % (ref 0–5)
Lymphocytes Relative: 28 % (ref 12–46)
Monocytes Absolute: 0.4 10*3/uL (ref 0.1–1.0)

## 2011-02-03 LAB — D-DIMER, QUANTITATIVE: D-Dimer, Quant: 0.71 ug/mL-FEU — ABNORMAL HIGH (ref 0.00–0.48)

## 2011-02-03 MED ORDER — IOHEXOL 300 MG/ML  SOLN
100.0000 mL | Freq: Once | INTRAMUSCULAR | Status: AC | PRN
  Administered 2011-02-03: 100 mL via INTRAVENOUS

## 2011-02-04 LAB — C-REACTIVE PROTEIN: CRP: 0.3 mg/dL — ABNORMAL LOW (ref <0.6)

## 2011-02-04 LAB — C4 COMPLEMENT: Complement C4, Body Fluid: 17 mg/dL (ref 16–47)

## 2011-02-04 LAB — ANA: Anti Nuclear Antibody(ANA): NEGATIVE

## 2011-02-04 LAB — ANTI-DNA ANTIBODY, DOUBLE-STRANDED: ds DNA Ab: 10 IU/mL (ref <30)

## 2011-02-06 ENCOUNTER — Other Ambulatory Visit (HOSPITAL_COMMUNITY): Payer: BC Managed Care – PPO

## 2011-02-06 NOTE — H&P (Signed)
Summer Cook, CRANGLE               ACCOUNT NO.:  [id number]  MEDICAL RECORD NO.:  [id number]           PATIENT TYPE:  O  LOCATION:  [id number]                          FACILITY:  [facility]  PHYSICIAN:  Karlyn Agee, M.D. DATE OF BIRTH:  12-27-1965  DATE OF ADMISSION:  02/02/2011 DATE OF DISCHARGE:  LH                             HISTORY & PHYSICAL   PRIMARY CARE PHYSICIAN:  Unassigned.  ONCOLOGIST:  Gaston Islam. [name], MD.  Summer Cook:  Summer Kind, MD.  DICTATING PHYSICIAN:  Karlyn Agee, MD  CHIEF COMPLAINT:  Chest pain since this morning.  HISTORY OF PRESENT ILLNESS:  This is an obese 45 year old Caucasian lady with a history of depression and ITP whose baseline platelet count is about 20, when she is on treatment treated who usually takes IVIg or steroids about every 3 months and who 1 week ago started a course of doxycycline for acne rosacea presumably secondary to steroids.  The patient noted that before breakfast this morning she started having chest tightness and heaviness.  She described the pain is about 8/10, did not radiate, but did seem to be moving up in her chest, was associated with sweating, nausea and shortness of breath, and she did not feel well enough to eat breakfast.  She managed to eat egg and toast at lunch, although the pain persisted at 8/10 and again had [place] stew and an ice cream on Sunday in the evening despite the presence of ongoing chest pain, tightness, dizziness and nausea.  Eventually, she went to lie down to see if she could get rid of her problem and her aunt noticed that she did not look well and after discussion she insisted she come to the emergency room to be evaluated.  In the emergency room, the patient received 2 doses of sublingual nitroglycerin which took the pain from a 7/10 to a 0/10.  The hospitalist service was called to assist with management.  The patient denies any similar problems.  She does say she walks about 2  hours almost every day as a form of exercise, she sometimes has to slow down because of difficulty breathing and chest tightness, but never has to stop.  She does not do much in the way of climbing stairs and is somewhat diffident about whether climbing stairs causes chest tightness.  She reports episodic lower extremity edema for many years.  She has no symptoms of orthopnea nor palpitations.  She has had no fever, cough or cold.  PAST MEDICAL HISTORY: 1. Depression. 2. ITP. 3. Acne rosacea. 4. Hypertension.  PAST SURGICAL HISTORY:  Includes cholecystectomy.  MEDICATIONS: 1. Triamterene/hydrochlorothiazide 37.5/25 one daily. 2. Doxycycline 100 mg daily for the past week she has 3 more doses     remaining. 3. Citalopram 40 mg daily.  ALLERGIES:  NO KNOWN DRUG ALLERGIES.  SOCIAL HISTORY:  She discontinued smoking about 2 years ago, but at that time only smokes one-pack every 2-3 weeks and did that for about 20 years.  She denies alcohol or illicit drug use.  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  FAMILY HISTORY:  She has an aunt with coronary artery disease, but no immediate family members with coronary artery disease.  Both parents died of cancer.  Her father who was a smoker had lung cancer.  Her mother had cervical cancer.  Her paternal grandmother had both colon cancer and breast cancer.  She had two brothers deceased, one with pneumonia and the other with an infection that she does not know the details of.  REVIEW OF SYSTEMS:  Other than noted above complete review of systems is unremarkable.  PHYSICAL EXAM:  GENERAL:  Obese middle-aged Caucasian lady lying in the stretcher somewhat flat affect.  No respiratory distress. VITALS:  Temperature is 97.9, pulse 88, respiration 18, blood pressure 175/95.  She is saturating at 98% on 2 liters. HEENT:  Her pupils are round and equal.  Mucous membranes pink. Anicteric.   Mild dehydration.  No cervical lymphadenopathy or thyromegaly.  No carotid bruits. CHEST:  Clear to auscultation bilaterally. CARDIOVASCULAR SYSTEM:  Regular rhythm.  No murmur. ABDOMEN:  Obese and soft.  She does have epigastric tenderness.  She does have reproducible sternal tenderness. EXTREMITIES:  Without edema.  She has 2+ dorsalis pedis pulses bilaterally.  There is no leg or calf swelling, but she is tender in the left calf. SKIN:  Warm and dry.  There is no ulcerations.  She has acneiform lesions in bilateral malar prominence; she has gray brown hyperpigmentation of the left arm above the elbow which she says is due to extravasation of an iron infusion 9 months ago. CENTRAL NERVOUS SYSTEM:  Cranial nerves II-XII are grossly intact.  She has no focal lateralizing signs.  LABORATORY DATA:  Her white count is 3.7, hemoglobin 13.2, platelets are 31.  Her differential is normal.  Smear review shows a platelet count confirmed on the peripheral smear, atypical lymphocytes noted also on the smear.  Her serum chemistry is reviewed and is essentially normal with a sodium of 138, potassium 3.8, chloride 102, CO2 25, glucose slightly elevated at 135, BUN 12, creatinine 1.23, calcium 10, total protein 6.9, albumin 3.6.  Liver functions are completely normal.  Her PT/PTT and INR are completely normal.  Cardiac enzymes are normal with undetectable CK-MB and troponin and myoglobin of 156.  Her chest x-ray shows clear lungs and normal size heart.  EKG, it was compared to an EKG done in March of this year, current EKG shows left bundle branch block which appears to be a slight progression from previous EKG.  Otherwise, acute ST-segment changes, possible left atrial abnormality.  ASSESSMENT: 1. Unstable angina as evidenced by central chest pain relieved by     nitroglycerin.  Differential also, however, includes acid reflux or     esophagitis secondary to doxycycline. 2. Hypertension  controlled. 3. Idiopathic thrombocytopenic purpura. 4. Obesity. 5. Acne rosacea.  PLAN: 1. We will bring this lady on observation for cardiac rule out.  We     will get serial cardiac enzymes.  Because of abnormal EKG, we will     also get a 2-D echo and will consider Cardiac consult in the     morning for a stress test, therefore, we will keep her n.p.o. after     midnight.  We will treat empirically for GERD with a dose of     intravenous proton pump inhibitor then high dose oral PPIs. 2. We will hold her diuretic and give Lopressor in view of her cardiac     symptoms and will hydrate  as she does have an elevated BUN and     creatinine ratio. 3. We will not give aspirin or prophylactic anticoagulation because of     her thrombocytopenia. 4. Before giving SCDs, because of her leg pain, we will rule out the      possibility of a clot in her leg.  We will check a D-dimer and     depending on the results we will take further action. 5. We will notify Dr. [name] of her admission in the morning since     she does have an appointment for blood work with him tomorrow. 6. Other plans as per orders.     Karlyn Agee, M.D.     LC/MEDQ  D:  02/02/2011  T:  02/03/2011  Job:  [id number]  cc:   Gaston Islam. [name], MD Fax: [phone]  Electronically Signed by Karlyn Agee M.D. on 02/06/2011 11:44:45 PM

## 2011-02-09 ENCOUNTER — Encounter (HOSPITAL_COMMUNITY): Payer: BC Managed Care – PPO

## 2011-02-09 ENCOUNTER — Other Ambulatory Visit: Payer: Self-pay | Admitting: General Surgery

## 2011-02-09 ENCOUNTER — Other Ambulatory Visit (HOSPITAL_COMMUNITY): Payer: Self-pay | Admitting: Oncology

## 2011-02-09 DIAGNOSIS — D693 Immune thrombocytopenic purpura: Secondary | ICD-10-CM

## 2011-02-09 LAB — SURGICAL PCR SCREEN: Staphylococcus aureus: INVALID — AB

## 2011-02-09 LAB — CBC
Hemoglobin: 13 g/dL (ref 12.0–15.0)
MCH: 28.1 pg (ref 26.0–34.0)
MCHC: 33 g/dL (ref 30.0–36.0)
MCV: 85.1 fL (ref 78.0–100.0)
Platelets: 29 10*3/uL — CL (ref 150–400)

## 2011-02-09 LAB — DIFFERENTIAL
Basophils Relative: 1 % (ref 0–1)
Basophils Relative: 1 % (ref 0–1)
Eosinophils Absolute: 0.1 10*3/uL (ref 0.0–0.7)
Eosinophils Absolute: 0.1 10*3/uL (ref 0.0–0.7)
Eosinophils Relative: 3 % (ref 0–5)
Monocytes Absolute: 0.4 10*3/uL (ref 0.1–1.0)
Monocytes Relative: 12 % (ref 3–12)
Monocytes Relative: 12 % (ref 3–12)
Neutrophils Relative %: 58 % (ref 43–77)

## 2011-02-09 LAB — URINALYSIS, ROUTINE W REFLEX MICROSCOPIC
Bilirubin Urine: NEGATIVE
Glucose, UA: NEGATIVE mg/dL
Ketones, ur: NEGATIVE mg/dL
Leukocytes, UA: NEGATIVE
Protein, ur: NEGATIVE mg/dL

## 2011-02-11 LAB — MRSA CULTURE

## 2011-02-12 ENCOUNTER — Other Ambulatory Visit (HOSPITAL_COMMUNITY): Payer: Self-pay | Admitting: Oncology

## 2011-02-12 ENCOUNTER — Encounter (HOSPITAL_COMMUNITY): Payer: BC Managed Care – PPO

## 2011-02-12 DIAGNOSIS — D693 Immune thrombocytopenic purpura: Secondary | ICD-10-CM

## 2011-02-12 LAB — SAMPLE TO BLOOD BANK

## 2011-02-12 LAB — CBC
MCH: 28.1 pg (ref 26.0–34.0)
MCV: 83.4 fL (ref 78.0–100.0)
Platelets: 40 10*3/uL — ABNORMAL LOW (ref 150–400)
RBC: 5.01 MIL/uL (ref 3.87–5.11)

## 2011-02-13 NOTE — Discharge Summary (Signed)
NAMEBREEANNA, [name]               ACCOUNT NO.:  [id number]   MEDICAL RECORD NO.:  [id number]          PATIENT TYPE:  INP   LOCATION:  A303                          FACILITY:  [facility]   PHYSICIAN:  Jamesetta So, M.D.  DATE OF BIRTH:  Oct 11, 1965   DATE OF ADMISSION:  10/21/2006  DATE OF DISCHARGE:  01/26/2008LH                               DISCHARGE SUMMARY   HOSPITAL COURSE:  The patient is a 45 year old white female with a known  history of cholelithiasis who presented to the emergency room with  worsening right upper quadrant abdominal pain and nausea.  Ultrasound of  the gallbladder revealed a gallstone in the neck of the gallbladder.  Surgery consultation was obtained.  The patient was noted to have mild  anemia and thrombocytopenia.  She was admitted to the hospital for  further evaluation and treatment.  Her thrombocytopenia stayed stable  and thus, she was taken the operating room on October 22, 2006, and  underwent a laparoscopic cholecystectomy.  She tolerated the procedure  well.  Her postoperative course has been unremarkable.  Her diet was  advanced without difficulty.  The patient is being discharged home on  postoperative day one in good improving condition.   DISCHARGE INSTRUCTIONS:  The patient is to follow up Dr. Aviva Signs on  October 28, 2006.   DISCHARGE MEDICATIONS:  Vicodin 1-2 tablets p.o. q.4h. p.r.n. pain.  She  is to resume her Claritin as previously prescribed.   PRINCIPAL DIAGNOSIS:  1. Cholecystitis, cholelithiasis.  2. Anemia.  3. Thrombocytopenia, idiopathic.   PRINCIPAL PROCEDURE:  Laparoscopic cholecystectomy on October 22, 2006.      Jamesetta So, M.D.  Electronically Signed     MAJ/MEDQ  D:  10/23/2006  T:  10/23/2006  Job:  [id number]   cc:   Bonne Dolores, M.D.  Fax: [phone]

## 2011-02-13 NOTE — H&P (Signed)
[name], [name]               ACCOUNT NO.:  [id number]   MEDICAL RECORD NO.:  [id number]          PATIENT TYPE:  INP   LOCATION:  A303                          FACILITY:  [facility]   PHYSICIAN:  Jamesetta So, M.D.  DATE OF BIRTH:  1966/05/02   DATE OF ADMISSION:  10/21/2006  DATE OF DISCHARGE:  LH                              HISTORY & PHYSICAL   CHIEF COMPLAINT:  Right upper quadrant abdominal pain.   HISTORY OF PRESENT ILLNESS:  The patient is a 45 year old white female  with a known history of cholelithiasis who presents to the emergency  room with worsening right upper quadrant pain radiating to the right  flank and shoulder.  She also has nausea.  She was given pain  medications in the emergency room and an ultrasound was performed which  revealed a gallstone in the neck of the gallbladder.  The gallbladder  wall was not thickened.  The common bile duct was within normal limits.   PAST MEDICAL HISTORY:  Includes anemia, low platelets.   PAST SURGICAL HISTORY:  Unremarkable.   CURRENT MEDICATIONS:  None.   ALLERGIES:  No known drug allergies.   REVIEW OF SYSTEMS:  The patient states that she saw a physician several  years ago and was told that she was anemic.  She has been on iron  supplements intermittently.  She does smoke.  She denies any alcohol  use.   PHYSICAL EXAMINATION:  GENERAL:  On physical examination, the patient is  a well-developed, well-nourished white female in no acute distress.  She  is afebrile.  VITAL SIGNS:  Stable.  HEENT:  HEENT examination reveals no scleral icterus.  LUNGS:  Clear to auscultation with equal breath sounds bilaterally.  HEART:  Examination reveals a regular rate and rhythm without S3, S4, or  murmurs.  ABDOMEN:  The abdomen is soft with tenderness on the right upper  quadrant to palpation.  No hepatosplenomegaly, masses, or hernias are  identified.   White blood cell count 4.3, hematocrit 35, platelet count 98,000.  Urine  pregnancy test is negative.  MET-7 is within normal limits.  Liver  profile was within normal limits.   IMPRESSION:  1. Cholecystitis, cholelithiasis.  2. Thrombocytopenia, anemia.   PLAN:  The patient will be admitted to the hospital for further  evaluation and treatment.  She subsequently will undergo a laparoscopic  cholecystectomy.  The risks and benefits of the procedure including  bleeding, infection, hepatobiliary injury, and the possibility of an  open procedure were fully explained to the patient, who gave informed  consent.      Jamesetta So, M.D.  Electronically Signed     MAJ/MEDQ  D:  10/21/2006  T:  10/21/2006  Job:  [id number]   cc:   SHORT STAY AT [facility]   CHART   Bonne Dolores, M.D.  Fax: [phone]

## 2011-02-13 NOTE — Op Note (Signed)
[name], [name]               ACCOUNT NO.:  [id number]   MEDICAL RECORD NO.:  [id number]          PATIENT TYPE:  INP   LOCATION:  A303                          FACILITY:  [facility]   PHYSICIAN:  Jamesetta So, M.D.  DATE OF BIRTH:  [birth year]   DATE OF PROCEDURE:  10/22/2006  DATE OF DISCHARGE:                               OPERATIVE REPORT   PREOPERATIVE DIAGNOSIS:  Cholecystitis, cholelithiasis.   POSTOPERATIVE DIAGNOSIS:  Cholecystitis, cholelithiasis.   PROCEDURE:  Laparoscopic cholecystectomy.   SURGEON:  Jamesetta So, M.D.   ANESTHESIA:  General endotracheal.   INDICATIONS:  The patient is a 45 year old white female who presents  with biliary colic secondary to cholelithiasis.  The risks and benefits  of the procedure, including bleeding, infection, hepatobiliary injury,  and the possibility of an open procedure, were fully explained to the  patient, who gave informed consent.   PROCEDURE NOTE:  The patient was placed in the supine position.  After  the induction of general endotracheal anesthesia, the abdomen was  prepped and draped using the usual sterile technique with Betadine.  Surgical site confirmation was performed.   An infraumbilical incision was made down to the fascia.  Veress needle  was introduced into the abdominal cavity, and confirmation of placement  was done using the saline drop test.  The abdomen was then insufflated  to 16 mmHg pressure.  An 11 mm trocar was introduced into the abdominal  cavity under direct visualization without difficulty.  The patient was  placed in reverse Trendelenburg position.  An additional 11 mm trocar  was placed epigastric region, and 5 mm trocars were placed in the right  upper quadrant and right flank regions.  The liver was inspected and  noted to be within normal limits.  The gallbladder was retracted  superior and laterally.  The dissection was begun around the  infundibulum of the gallbladder.  The cystic  duct was first identified.  Its juncture to the infundibulum was fully identified.  Endoclips were  placed proximally and distally on the cystic duct, and the cystic duct  was divided.  This was likewise done to the cystic artery.  The  gallbladder was then freed away from the gallbladder fossa using Bovie  electrocautery.  The gallbladder was delivered through the epigastric  trocar site using an EndoCatch bag.  The gallbladder fossa was then  inspected.  No abnormal bleeding or bile leakage was noted.  Surgicel  was placed in the gallbladder fossa.  All fluid and air were then  evacuate from the abdominal cavity prior to removal of the trocars.   All wounds were irrigated normal saline.  All wounds were checked with  0.5% Sensorcaine.  The infraumbilical fascia was reapproximated using an  0 Vicryl interrupted suture.  All skin incisions were closed using  staples.  A small skin tag in the umbilicus was removed using shave  cautery as per the request of the patient.  Betadine ointment and dry  sterile dressings were applied.   All tape and needle counts were correct at the end of the procedure.  The patient was extubated in the operating room and went back to the  recovery room awake, in stable condition.   COMPLICATIONS:  None.   SPECIMEN:  Gallbladder.   BLOOD LOSS:  Minimal.      Jamesetta So, M.D.  Electronically Signed     MAJ/MEDQ  D:  10/22/2006  T:  10/22/2006  Job:  [id number]   cc:   Bonne Dolores, M.D.  Fax: [phone]

## 2011-02-16 ENCOUNTER — Encounter (HOSPITAL_COMMUNITY): Payer: BC Managed Care – PPO

## 2011-02-16 ENCOUNTER — Other Ambulatory Visit (HOSPITAL_COMMUNITY): Payer: Self-pay | Admitting: Oncology

## 2011-02-16 DIAGNOSIS — D649 Anemia, unspecified: Secondary | ICD-10-CM

## 2011-02-16 LAB — CBC
HCT: 41.9 % (ref 36.0–46.0)
Hemoglobin: 13.8 g/dL (ref 12.0–15.0)
MCHC: 32.9 g/dL (ref 30.0–36.0)
MCV: 84 fL (ref 78.0–100.0)
WBC: 7.2 10*3/uL (ref 4.0–10.5)

## 2011-02-17 ENCOUNTER — Other Ambulatory Visit (INDEPENDENT_AMBULATORY_CARE_PROVIDER_SITE_OTHER): Payer: Self-pay | Admitting: General Surgery

## 2011-02-17 ENCOUNTER — Inpatient Hospital Stay (HOSPITAL_COMMUNITY)
Admission: RE | Admit: 2011-02-17 | Discharge: 2011-02-20 | DRG: 392 | Disposition: A | Discharge status: Home / Self Care | Payer: BC Managed Care – PPO | Source: Ambulatory Visit | Attending: General Surgery | Admitting: General Surgery

## 2011-02-17 DIAGNOSIS — I10 Essential (primary) hypertension: Secondary | ICD-10-CM | POA: Diagnosis present

## 2011-02-17 DIAGNOSIS — K219 Gastro-esophageal reflux disease without esophagitis: Secondary | ICD-10-CM | POA: Diagnosis present

## 2011-02-17 DIAGNOSIS — D693 Immune thrombocytopenic purpura: Principal | ICD-10-CM | POA: Diagnosis present

## 2011-02-17 LAB — CBC
HCT: 42.4 % (ref 36.0–46.0)
Hemoglobin: 15 g/dL (ref 12.0–15.0)
Hemoglobin: 14 g/dL (ref 12.0–15.0)
MCH: 28 pg (ref 26.0–34.0)
MCH: 27.4 pg (ref 26.0–34.0)
MCHC: 33 g/dL (ref 30.0–36.0)
MCV: 81.3 fL (ref 78.0–100.0)
RBC: 5.36 MIL/uL — ABNORMAL HIGH (ref 3.87–5.11)
RDW: 13.6 % (ref 11.5–15.5)

## 2011-02-17 LAB — COMPREHENSIVE METABOLIC PANEL
ALT: 34 U/L (ref 0–35)
AST: 24 U/L (ref 0–37)
Alkaline Phosphatase: 48 U/L (ref 39–117)
CO2: 25 mEq/L (ref 19–32)
Chloride: 100 mEq/L (ref 96–112)
GFR calc Af Amer: 59 mL/min — ABNORMAL LOW (ref >60)
GFR calc non Af Amer: 49 mL/min — ABNORMAL LOW (ref >60)
Sodium: 138 mEq/L (ref 135–145)
Total Bilirubin: 0.5 mg/dL (ref 0.3–1.2)

## 2011-02-17 LAB — MISCELLANEOUS TEST

## 2011-02-18 LAB — BASIC METABOLIC PANEL
BUN: 10 mg/dL (ref 6–23)
CO2: 29 mEq/L (ref 19–32)
Calcium: 8 mg/dL — ABNORMAL LOW (ref 8.4–10.5)
Chloride: 102 mEq/L (ref 96–112)
Creatinine, Ser: 0.95 mg/dL (ref 0.4–1.2)
GFR calc Af Amer: >60 mL/min (ref >60)
Glucose, Bld: 109 mg/dL — ABNORMAL HIGH (ref 70–99)

## 2011-02-18 LAB — CBC
HCT: 38 % (ref 36.0–46.0)
Hemoglobin: 12.8 g/dL (ref 12.0–15.0)
MCH: 28 pg (ref 26.0–34.0)
MCHC: 33.7 g/dL (ref 30.0–36.0)
MCV: 83.2 fL (ref 78.0–100.0)
RBC: 4.57 MIL/uL (ref 3.87–5.11)

## 2011-02-18 LAB — PREPARE PLATELET PHERESIS: Unit division: 0

## 2011-02-19 LAB — CBC
MCH: 27.6 pg (ref 26.0–34.0)
MCHC: 32.7 g/dL (ref 30.0–36.0)
MCV: 84.5 fL (ref 78.0–100.0)
Platelets: 84 10*3/uL — ABNORMAL LOW (ref 150–400)
RBC: 4.38 MIL/uL (ref 3.87–5.11)
RDW: 13.3 % (ref 11.5–15.5)

## 2011-02-20 LAB — CBC
HCT: 39.4 % (ref 36.0–46.0)
MCHC: 32.5 g/dL (ref 30.0–36.0)
Platelets: 44 10*3/uL — ABNORMAL LOW (ref 150–400)
RDW: 13.5 % (ref 11.5–15.5)
WBC: 10 10*3/uL (ref 4.0–10.5)

## 2011-02-20 NOTE — Op Note (Signed)
NAMEMELISSA, GOHDE               ACCOUNT NO.:  [id number]  MEDICAL RECORD NO.:  [id number]           PATIENT TYPE:  I  LOCATION:  0009                         FACILITY:  [place]:  Edsel Petrin. Dalbert Batman, M.D.DATE OF BIRTH:  Feb 09, 1966  DATE OF PROCEDURE:  02/17/2011 DATE OF DISCHARGE:                              OPERATIVE REPORT   PREOPERATIVE DIAGNOSIS:  Idiopathic thrombocytopenic purpura.  POSTOPERATIVE DIAGNOSIS:  Idiopathic thrombocytopenic purpura.  OPERATION PERFORMED:  Laparoscopic splenectomy.  SURGEON:  Edsel Petrin. Dalbert Batman, MD.  FIRST ASSISTANT:  Joyice Faster. Cornett, MD.  OPERATIVE INDICATIONS:  This is a 45 year old Caucasian female who has had intermittent problems with thrombocytopenia.  Her platelet count has been as low as 5000 on some occasions.  She responded to IVIG and has also responded to Decadron but she does not respond to Rituxan at all. Sedimentation rate is elevated which is inconsistent with ITP.  CT scan shows a slightly enlarged spleen without mass and without adenopathy and without any other pathologic findings.  She has been evaluated by rheumatologist.  Both Dr. [name] and rheumatologist have decided that although ITP is the number one working diagnosis that lymphoma or other autoimmune processes might be playing here.  They have recommended that she have a splenectomy at the earliest opportunity.  She has had HIV testing which is negative.  She has been given all 3 preop vaccines and has been counseled as an outpatient and is brought to operating room electively.  OPERATIVE FINDINGS:  The spleen was homogeneous in texture.  There were no masses.  It was of normal color.  It was slightly enlarged.  There was no inflammatory component.  I did not find any accessory spleens in the hilum of the spleen along the pancreas, along the lesser omentum or the greater omentum or up at the short gastric vessels.  The pancreas looked normal.  The  liver, small intestine, and large intestine were normal to inspection.  There was no ascites.  OPERATIVE TECHNIQUE:  Following the induction of general endotracheal anesthesia, a Foley catheter was inserted.  Intravenous antibiotics were given.  The patient was placed in the modified right lateral decubitus position with a bump roll beneath her right flank which helped to extend her left costal margin somewhat.  After appropriate strapping and padding, a beanbag was positioned under her for final security.  The left arm was draped over the face and supported with a rigid supporter.  The abdomen was prepped and draped in a sterile fashion.  Surgical time- out was held identifying correct patient, correct procedure and correct sites.  A 0.5% Marcaine with epinephrine was used as a local infiltration anesthetic.  A 12-mm trocar was placed in the left rectus muscle about 12-15 cm away from the costal margin.  This was off the port and the entry was uneventful.  Pneumoperitoneum was created.  Video camera was inserted.  We placed a 5-mm trocar in the subxiphoid region and put a Nathanson retractor in that site to hold the left lobe of the liver up.  Another 5-mm trocar was placed in the left upper quadrant and  a 15-mm trocar was placed in the left flank.  I identified the splenic flexure of the colon and took down the splenocolic ligament putting this splenic flexure of the colon down somewhat.  I could see behind the spleen fairly well at this point.  I then identified the greater curvature of the stomach and the gastrocolic omentum.  I divided the gastrocolic omentum with the harmonic scalpel starting at the midportion of the greater curvature of the stomach and took this dissection all the way up until I had taken down all the short gastric vessels.  I then slowly dissected the retroperitoneal and areolar attachments of the superior pole of the spleen, mobilized it somewhat.  I then took  the dissection down to the lower pole of the spleen and went behind the spleen and mobilized its lateral peritoneal attachments and then mobilized the spleen from lateral to medial until I had dissected out the hilum from the posterior approach.  I found some lower pole vessels that were quite large, isolated them and divided them with Endo-GIA stapler with 2.5 mm staple load.  I had good hemostasis there.  I then came back anteriorly and dissected the hilum slowly taking the peritoneal layers off until I could clearly see the splenic vein and the splenic artery.  The splenic artery was quite large and very tortuous. After we had done this dissection, we had a small  vascular pedicle of the spleen and we could lift the spleen well up into the air.  We then passed a 60-mm stapler with 2.5 mm staple load through the left flank trocar and up across the hilum of the spleen.  We could see that the tail of pancreas was away from this area.  We placed a stapler up to hilum of the spleen and then closed the stapler, held in place for 60 seconds, fired and removed it.  We had excellent hemostasis at this point.  The spleen was placed in the specimen bag and brought out through the left flank trocar site.  The left flank trocar site was opened up until it was almost 2.5 cm in length.  We lifted the bag up and then removed the spleen from the bag by fragmenting it with a ring forceps.  The spleen was sent to pathology with the appropriate clinical history attached, asking the pathologist to evaluate for ITP and lymphoma.  We then closed the left flank muscle in layers with interrupted sutures of #1 Novafil.  We reinsufflated the abdomen and looked back in and looked around.  We irrigated the left upper quadrant. There was absolutely no bleeding and no leakage anywhere.  The tail of pancreas looked good.  We removed the [facility] retractor.  We then flattened the table and took a very careful look at  the rest of the liver and sigmoid colon, transverse colon, right colon and small intestine.  We found no pathology visually.  We irrigated the left upper quadrant one more time.  We talked about whether to put a drain in or not, and probably was not a good indication for that.  Pneumoperitoneum was released.  The trocars were removed.  The rest of skin incisions were closed with skin staples.  Clean bandages were placed and the patient taken to recovery room in stable condition.  Estimated blood loss was about 30 to 50 cc.  Complications were none.  Sponge, needle, instrument counts were correct.     Edsel Petrin. Dalbert Batman, M.D.  HMI/MEDQ  D:  02/17/2011  T:  02/17/2011  Job:  [id number]  cc:   Gaston Islam. [name], MD Fax: [phone]  Electronically Signed by Fanny Skates M.D. on 02/20/2011 08:46:35 AM

## 2011-02-21 ENCOUNTER — Emergency Department (HOSPITAL_COMMUNITY)
Admission: EM | Admit: 2011-02-21 | Discharge: 2011-02-21 | Disposition: A | Discharge status: Home / Self Care | Payer: BC Managed Care – PPO | Attending: Emergency Medicine | Admitting: Emergency Medicine

## 2011-02-21 ENCOUNTER — Emergency Department (HOSPITAL_COMMUNITY): Payer: BC Managed Care – PPO

## 2011-02-21 DIAGNOSIS — F3289 Other specified depressive episodes: Secondary | ICD-10-CM | POA: Insufficient documentation

## 2011-02-21 DIAGNOSIS — F329 Major depressive disorder, single episode, unspecified: Secondary | ICD-10-CM | POA: Insufficient documentation

## 2011-02-21 DIAGNOSIS — Z79899 Other long term (current) drug therapy: Secondary | ICD-10-CM | POA: Insufficient documentation

## 2011-02-21 DIAGNOSIS — K5900 Constipation, unspecified: Secondary | ICD-10-CM | POA: Insufficient documentation

## 2011-02-21 DIAGNOSIS — D693 Immune thrombocytopenic purpura: Secondary | ICD-10-CM | POA: Insufficient documentation

## 2011-02-24 ENCOUNTER — Other Ambulatory Visit (HOSPITAL_COMMUNITY): Payer: Self-pay | Admitting: Oncology

## 2011-02-24 ENCOUNTER — Encounter (HOSPITAL_COMMUNITY): Payer: BC Managed Care – PPO

## 2011-02-24 DIAGNOSIS — D649 Anemia, unspecified: Secondary | ICD-10-CM

## 2011-02-24 LAB — CBC
Hemoglobin: 12.6 g/dL (ref 12.0–15.0)
MCH: 27.8 pg (ref 26.0–34.0)
Platelets: 56 10*3/uL — ABNORMAL LOW (ref 150–400)
RBC: 4.54 MIL/uL (ref 3.87–5.11)

## 2011-03-03 ENCOUNTER — Encounter (HOSPITAL_COMMUNITY): Payer: BC Managed Care – PPO | Attending: Oncology

## 2011-03-03 ENCOUNTER — Other Ambulatory Visit (HOSPITAL_COMMUNITY): Payer: Self-pay | Admitting: Oncology

## 2011-03-03 DIAGNOSIS — D696 Thrombocytopenia, unspecified: Secondary | ICD-10-CM | POA: Insufficient documentation

## 2011-03-03 DIAGNOSIS — Z79899 Other long term (current) drug therapy: Secondary | ICD-10-CM | POA: Insufficient documentation

## 2011-03-03 DIAGNOSIS — D693 Immune thrombocytopenic purpura: Secondary | ICD-10-CM

## 2011-03-03 LAB — CBC
HCT: 41.8 % (ref 36.0–46.0)
Platelets: 36 10*3/uL — ABNORMAL LOW (ref 150–400)
RBC: 4.98 MIL/uL (ref 3.87–5.11)
RDW: 13.2 % (ref 11.5–15.5)
WBC: 5.2 10*3/uL (ref 4.0–10.5)

## 2011-03-06 NOTE — Discharge Summary (Signed)
Summer Cook, Summer Cook               ACCOUNT NO.:  [id number]  MEDICAL RECORD NO.:  [id number]  LOCATION:  67                         FACILITY:  [place]:  Edsel Petrin. Dalbert Batman, M.D.DATE OF BIRTH:  [birth year]  DATE OF ADMISSION:  02/17/2011 DATE OF DISCHARGE:  02/20/2011                              DISCHARGE SUMMARY   FINAL DIAGNOSES: 1. Idiopathic thrombocytopenic purpura. 2. Hypertension. 3. Status post laparoscopic cholecystectomy.  OPERATIONS PERFORMED:  Laparoscopic splenectomy, on Feb 17, 2011.  HISTORY:  This is a 45 year old Caucasian female who has had intermittent problems with thrombocytopenia.  Platelet count has been as low as 5000 on some occasions.  She has responded to IVIG and is also responded to Decadron, but she does not respond to Rituxan at all. Sedimentation rate is elevated, which was thought to be a bit inconsistent with ITP.  She has been seen by Dr. Trudie Reed, who was a rheumatologist.  A CT scan shows a slightly enlarged spleen, but no mass effect or other pathologic findings.  Dr. [name] feels that ITP is the number one diagnosis with also lymphoma or other autoimmune process might be in play here.  They, both, Dr. [name] and Dr. Trudie Reed agreed that the next step in her care should be splenectomy.  She has been given all 3 preop vaccine and has been counseled by me as an outpatient and was brought to operating room electively.  HOSPITAL COURSE:  On the day of admission, the patient was taken to the operating room and underwent a laparoscopic splenectomy.  The procedure was uneventful.  The final pathology report shows no evidence of monoclonal B-cell population or abnormal T-cell phenotype.  Evaluation of the spleen itself showed benign splenic parenchyma with lymphoid depletion, felt to be most consistent with those seen in idiopathic thrombocytopenic purpura with treatment effect.  Postoperatively, the patient did well.  Her platelet  count was 140,000 on postop day #1.  Blood pressure was slightly elevated and we resumed her hydrochlorothiazide and triamterene and began a liquid diet.  On postop day #2, she was doing well except for mild nausea.  Hemoglobin was 12.1, which was stable, but platelet count fell to 84,000.  On postop day #3, she was doing well, tolerating a diet, and felt ready to go home.  Pathology was returned and was discussed with her.  Platelet count on discharge was 44,000 and hemoglobin was 12.8.  Arrangements were made for her to return to see me in the office in 1 week.  She was told to follow with Dr. [name] within 1 week.  She was given a prescription for Vicodin for pain and told to use MiraLax as needed for any constipation that might arise.  Other discharge medications include, 1. Triamterene/hydrochlorothiazide 37.5/25 mg daily. 2. Protonix 40 mg a day. 3. Citalopram 40 mg a day. 4. Tylenol 325 mg as needed.     Edsel Petrin. Dalbert Batman, M.D.     HMI/MEDQ  D:  03/05/2011  T:  03/06/2011  Job:  [id number]  cc:   Gaston Islam. [name], MD Fax: [phone]  Jonnie Kind, M.D. Fax: [phone]  Gavin Pound, MD Fax: [phone]  Electronically Signed by Fanny Skates  M.D. on 03/06/2011 03:57:07 PM

## 2011-03-10 ENCOUNTER — Encounter (HOSPITAL_COMMUNITY): Payer: BC Managed Care – PPO

## 2011-03-10 ENCOUNTER — Other Ambulatory Visit (HOSPITAL_COMMUNITY): Payer: Self-pay | Admitting: Oncology

## 2011-03-10 DIAGNOSIS — D693 Immune thrombocytopenic purpura: Secondary | ICD-10-CM

## 2011-03-10 LAB — CBC
HCT: 44.2 % (ref 36.0–46.0)
Hemoglobin: 14.5 g/dL (ref 12.0–15.0)
MCH: 27.5 pg (ref 26.0–34.0)
RBC: 5.28 MIL/uL — ABNORMAL HIGH (ref 3.87–5.11)

## 2011-03-17 ENCOUNTER — Other Ambulatory Visit (HOSPITAL_COMMUNITY): Payer: Self-pay | Admitting: Oncology

## 2011-03-17 ENCOUNTER — Encounter (HOSPITAL_COMMUNITY): Payer: BC Managed Care – PPO

## 2011-03-17 DIAGNOSIS — D693 Immune thrombocytopenic purpura: Secondary | ICD-10-CM

## 2011-03-17 LAB — CBC
HCT: 45.4 % (ref 36.0–46.0)
MCH: 27.2 pg (ref 26.0–34.0)
MCV: 83.5 fL (ref 78.0–100.0)
RBC: 5.44 MIL/uL — ABNORMAL HIGH (ref 3.87–5.11)
WBC: 4 10*3/uL (ref 4.0–10.5)

## 2011-03-18 ENCOUNTER — Encounter (INDEPENDENT_AMBULATORY_CARE_PROVIDER_SITE_OTHER): Payer: Self-pay | Admitting: General Surgery

## 2011-03-18 ENCOUNTER — Ambulatory Visit (HOSPITAL_COMMUNITY): Payer: BC Managed Care – PPO | Admitting: Oncology

## 2011-03-18 DIAGNOSIS — I10 Essential (primary) hypertension: Secondary | ICD-10-CM

## 2011-03-18 DIAGNOSIS — D693 Immune thrombocytopenic purpura: Secondary | ICD-10-CM

## 2011-03-26 ENCOUNTER — Telehealth (INDEPENDENT_AMBULATORY_CARE_PROVIDER_SITE_OTHER): Payer: Self-pay

## 2011-03-26 NOTE — Telephone Encounter (Signed)
Received refill request for hydrocodone. Declined by Dr Dalbert Batman. Pt to take advil,tylenol or alieve and make ov if pain requires narcotics. Per Dr Dalbert Batman. I left msg on pts phone to call re: this.

## 2011-03-26 NOTE — Telephone Encounter (Signed)
Copy was noted in documents re:rx

## 2011-03-27 ENCOUNTER — Encounter (HOSPITAL_COMMUNITY): Payer: BC Managed Care – PPO

## 2011-03-27 ENCOUNTER — Other Ambulatory Visit (HOSPITAL_COMMUNITY): Payer: Self-pay | Admitting: Oncology

## 2011-03-27 DIAGNOSIS — D696 Thrombocytopenia, unspecified: Secondary | ICD-10-CM

## 2011-03-27 LAB — CBC
HCT: 44.2 % (ref 36.0–46.0)
Hemoglobin: 14.5 g/dL (ref 12.0–15.0)
MCH: 27.3 pg (ref 26.0–34.0)
MCHC: 32.8 g/dL (ref 30.0–36.0)
MCV: 83.1 fL (ref 78.0–100.0)

## 2011-03-27 LAB — DIFFERENTIAL
Basophils Relative: 0 % (ref 0–1)
Eosinophils Absolute: 0.1 10*3/uL (ref 0.0–0.7)
Eosinophils Relative: 1 % (ref 0–5)
Monocytes Absolute: 1.5 10*3/uL — ABNORMAL HIGH (ref 0.1–1.0)
Monocytes Relative: 16 % — ABNORMAL HIGH (ref 3–12)

## 2011-04-03 ENCOUNTER — Encounter (HOSPITAL_COMMUNITY): Payer: BC Managed Care – PPO | Attending: Oncology

## 2011-04-03 DIAGNOSIS — L738 Other specified follicular disorders: Secondary | ICD-10-CM | POA: Insufficient documentation

## 2011-04-03 DIAGNOSIS — D693 Immune thrombocytopenic purpura: Secondary | ICD-10-CM

## 2011-04-03 DIAGNOSIS — G4700 Insomnia, unspecified: Secondary | ICD-10-CM | POA: Insufficient documentation

## 2011-04-07 ENCOUNTER — Encounter (HOSPITAL_BASED_OUTPATIENT_CLINIC_OR_DEPARTMENT_OTHER): Payer: BC Managed Care – PPO

## 2011-04-07 ENCOUNTER — Other Ambulatory Visit (HOSPITAL_COMMUNITY): Payer: Self-pay | Admitting: Oncology

## 2011-04-07 DIAGNOSIS — D693 Immune thrombocytopenic purpura: Secondary | ICD-10-CM

## 2011-04-07 LAB — DIFFERENTIAL
Basophils Absolute: 0 10*3/uL (ref 0.0–0.1)
Lymphocytes Relative: 4 % — ABNORMAL LOW (ref 12–46)
Monocytes Absolute: 0.6 10*3/uL (ref 0.1–1.0)
Neutro Abs: 10.8 10*3/uL — ABNORMAL HIGH (ref 1.7–7.7)

## 2011-04-07 LAB — CBC
MCH: 26.5 pg (ref 26.0–34.0)
MCHC: 32.2 g/dL (ref 30.0–36.0)
MCV: 82.4 fL (ref 78.0–100.0)
Platelets: 323 10*3/uL (ref 150–400)
RBC: 5.05 MIL/uL (ref 3.87–5.11)
RDW: 14.1 % (ref 11.5–15.5)

## 2011-04-07 LAB — COMPREHENSIVE METABOLIC PANEL
AST: 16 U/L (ref 0–37)
CO2: 28 mEq/L (ref 19–32)
Calcium: 9.6 mg/dL (ref 8.4–10.5)
Creatinine, Ser: 0.97 mg/dL (ref 0.50–1.10)
GFR calc non Af Amer: >60 mL/min (ref >60)

## 2011-04-07 NOTE — Progress Notes (Signed)
Addended byTressie Stalker MD, Quincey Quesinberry S on: 04/07/2011 10:52 AM   Modules accepted: Orders

## 2011-04-10 ENCOUNTER — Encounter (HOSPITAL_COMMUNITY): Payer: BC Managed Care – PPO

## 2011-04-13 ENCOUNTER — Encounter (INDEPENDENT_AMBULATORY_CARE_PROVIDER_SITE_OTHER): Payer: Self-pay | Admitting: General Surgery

## 2011-04-13 ENCOUNTER — Ambulatory Visit (INDEPENDENT_AMBULATORY_CARE_PROVIDER_SITE_OTHER): Payer: BC Managed Care – PPO | Admitting: General Surgery

## 2011-04-13 DIAGNOSIS — D6959 Other secondary thrombocytopenia: Secondary | ICD-10-CM

## 2011-04-13 NOTE — Patient Instructions (Addendum)
Continue medical followup with Dr. Everardo All and with Dr. Sharilyn Sites in [place]. In terms of your surgery, you have  recovered, and you may resume all normal physical activities without restriction. Return to see me in the future only as needed.

## 2011-04-13 NOTE — Progress Notes (Signed)
Subjective:     Patient ID: Summer Cook, female   DOB: [birth year], 45 y.o.   MRN: [id number]  HPI This patient underwent laparoscopic splenectomy on Feb 17, 2011 or presumed immune thrombocytopenia. She has recovered from the surgery uneventfully. She has had recurrent thrombocytopenia and has required at least 2 episodes of Decadron therapy. Most recently on April 07, 2011 her hemoglobin was 11.9 and her platelet count is 323,000. She is followed closely by Dr. Everardo All.  Review of Systems     Objective:   Physical Exam Patient looks well and is in no distress. Abdomen is soft and nontender. All of the trocar sites and incisions have healed nicely without hernia.    Assessment:     1. Idiopathic thrombocytopenia, uneventful recovery following laparoscopic splenectomy. 2. Recurrent thrombocytopenia, etiology uncertain.    Plan:     You may resume all normal physical activities without restriction. Return to see me only as needed. Continue medical followup with Dr. Everardo All with Dr. Sharilyn Sites, your primary care physician.

## 2011-04-14 ENCOUNTER — Encounter (HOSPITAL_BASED_OUTPATIENT_CLINIC_OR_DEPARTMENT_OTHER): Payer: BC Managed Care – PPO

## 2011-04-14 ENCOUNTER — Other Ambulatory Visit (HOSPITAL_COMMUNITY): Payer: Self-pay | Admitting: Oncology

## 2011-04-14 ENCOUNTER — Telehealth (HOSPITAL_COMMUNITY): Payer: Self-pay

## 2011-04-14 ENCOUNTER — Other Ambulatory Visit (HOSPITAL_COMMUNITY): Payer: BC Managed Care – PPO

## 2011-04-14 DIAGNOSIS — D693 Immune thrombocytopenic purpura: Secondary | ICD-10-CM

## 2011-04-14 LAB — DIFFERENTIAL
Basophils Absolute: 0 10*3/uL (ref 0.0–0.1)
Basophils Relative: 0 % (ref 0–1)
Monocytes Absolute: 0.8 10*3/uL (ref 0.1–1.0)
Neutro Abs: 3.4 10*3/uL (ref 1.7–7.7)
Neutrophils Relative %: 58 % (ref 43–77)

## 2011-04-14 LAB — CBC
HCT: 43.2 % (ref 36.0–46.0)
Hemoglobin: 14.1 g/dL (ref 12.0–15.0)
MCHC: 32.6 g/dL (ref 30.0–36.0)
WBC: 5.8 10*3/uL (ref 4.0–10.5)

## 2011-04-14 MED ORDER — PREDNISONE 20 MG PO TABS: 80.0000 mg | ORAL_TABLET | Freq: Every day | ORAL | Status: AC

## 2011-04-14 NOTE — Telephone Encounter (Addendum)
Not on any steroids at present.  Took decadron 4 mg tablets 10 tablets daily x 4 on 6/20 and and 7/4.  Does not have an appointment to see Gavin Pound, but plans to make one.  Her weight @ home is 173 pounds and uses [employer]. Patient notified to pick up prescription for Predinsone and to start taking today.

## 2011-04-14 NOTE — Progress Notes (Signed)
Labs drawn today for cbc/diff 

## 2011-04-15 ENCOUNTER — Other Ambulatory Visit (HOSPITAL_COMMUNITY): Payer: BC Managed Care – PPO

## 2011-04-16 ENCOUNTER — Other Ambulatory Visit (HOSPITAL_COMMUNITY): Payer: BC Managed Care – PPO

## 2011-04-16 ENCOUNTER — Other Ambulatory Visit (HOSPITAL_COMMUNITY): Payer: Self-pay | Admitting: Oncology

## 2011-04-16 ENCOUNTER — Telehealth (HOSPITAL_COMMUNITY): Payer: Self-pay

## 2011-04-16 DIAGNOSIS — D693 Immune thrombocytopenic purpura: Secondary | ICD-10-CM

## 2011-04-16 NOTE — Telephone Encounter (Signed)
Call made to Dr. Trudie Reed' office.  Per the secretary, Summer Cook has been discharged from their office and an appointment can not be made for her.

## 2011-04-17 ENCOUNTER — Other Ambulatory Visit (HOSPITAL_COMMUNITY): Payer: BC Managed Care – PPO

## 2011-04-17 ENCOUNTER — Encounter (HOSPITAL_COMMUNITY): Payer: BC Managed Care – PPO

## 2011-04-17 ENCOUNTER — Encounter (HOSPITAL_BASED_OUTPATIENT_CLINIC_OR_DEPARTMENT_OTHER): Payer: BC Managed Care – PPO | Admitting: Oncology

## 2011-04-17 ENCOUNTER — Encounter (HOSPITAL_COMMUNITY): Payer: Self-pay | Admitting: Oncology

## 2011-04-17 VITALS — BP 132/94 | HR 87 | Temp 98.4°F | Wt 181.6 lb

## 2011-04-17 DIAGNOSIS — D693 Immune thrombocytopenic purpura: Secondary | ICD-10-CM

## 2011-04-17 DIAGNOSIS — L298 Other pruritus: Secondary | ICD-10-CM

## 2011-04-17 DIAGNOSIS — L739 Follicular disorder, unspecified: Secondary | ICD-10-CM

## 2011-04-17 LAB — HEMOGLOBIN A1C: Mean Plasma Glucose: 134 mg/dL — ABNORMAL HIGH (ref <117)

## 2011-04-17 LAB — DIFFERENTIAL
Basophils Absolute: 0 10*3/uL (ref 0.0–0.1)
Basophils Relative: 0 % (ref 0–1)
Eosinophils Absolute: 0 10*3/uL (ref 0.0–0.7)
Monocytes Relative: 8 % (ref 3–12)
Neutro Abs: 9.5 10*3/uL — ABNORMAL HIGH (ref 1.7–7.7)
Neutrophils Relative %: 74 % (ref 43–77)

## 2011-04-17 LAB — BASIC METABOLIC PANEL
BUN: 15 mg/dL (ref 6–23)
CO2: 31 mEq/L (ref 19–32)
Calcium: 9.9 mg/dL (ref 8.4–10.5)
Chloride: 101 mEq/L (ref 96–112)
Creatinine, Ser: 0.97 mg/dL (ref 0.50–1.10)
GFR calc Af Amer: >60 mL/min (ref >60)
GFR calc non Af Amer: >60 mL/min (ref >60)
Glucose, Bld: 72 mg/dL (ref 70–99)
Potassium: 4.2 mEq/L (ref 3.5–5.1)
Sodium: 139 mEq/L (ref 135–145)

## 2011-04-17 LAB — CBC
Hemoglobin: 13.7 g/dL (ref 12.0–15.0)
MCHC: 32 g/dL (ref 30.0–36.0)

## 2011-04-17 MED ORDER — DOXYCYCLINE HYCLATE 100 MG PO TABS: 100.0000 mg | ORAL_TABLET | Freq: Every day | ORAL | Status: AC

## 2011-04-17 NOTE — Progress Notes (Signed)
Summer Kilts, MD [id number] [place] [address]  1. ITP (idiopathic thrombocytopenic purpura)  Basic metabolic panel, Hemoglobin A1c, CBC, Basic metabolic panel, Basic metabolic panel, Hemoglobin A1c, Hemoglobin A1c, CBC  2. Folliculitis  doxycycline (VIBRA-TABS) 100 MG tablet, Hemoglobin A1c, CBC, Hemoglobin A1c, Hemoglobin A1c    CURRENT THERAPY: The patient is status post a trial of high-dose steroids. Status post IVIG, rituxan, Decadron, and splenectomy. None of these therapies have provided a long-standing increase in platelet count.  INTERVAL HISTORY: Summer Cook 45 y.o. female returns for followup visit of her ITP. The patient is status post splenectomy on 02/17/2011. Her platelet count slightly improved but then worsened. She was then tried on high-dose Decadron. Again her platelet count increased to her highest count we never had recorded of 323,000. Unfortunately she then decreased to a critical value of 9000. As a result she was brought in to the clinic for a CBC to evaluate her platelet counts now she is back on Decadron.  The interestingly has then increased sedimentation rate as high as 110 but is much decreased when on steroids.  Patient continues to complain of chronic fatigue. Just complains of a follicular type rash on her back and chest. It is not pruritic or painful.  We'll give her a prescription for doxycycline for 7 days. The patient also asks if we will phone paperwork for short-term disability. This is reasonable for this patient because she certainly cannot continue to go to work occasionally and then be removed from work due to low platelet count. Her platelet count drops to within critical range and therefore it would be unsafe for the patient to work during these times.    Past Medical History  Diagnosis Date  . Hypertension   . ITP (idiopathic thrombocytopenic purpura)   . Platelets decreased   . Depression     has  Hypertension and ITP (idiopathic thrombocytopenic purpura) on her problem list.     is allergic to decadron.  Summer Cook does not currently have medications on file.  Past Surgical History  Procedure Date  . Cholecystectomy   . Bone marrow biopsy   . Splenectomy, total     Denies any headaches, dizziness, double vision, fevers, chills, night sweats, nausea, vomiting, diarrhea, constipation, chest pain, heart palpitations, shortness of breath, blood in stool, black tarry stool, urinary pain, urinary burning, urinary frequency, hematuria.   PHYSICAL EXAMINATION  Filed Vitals:   04/17/11 0943  BP: 132/94  Pulse: 87  Temp: 98.4 F (36.9 C)    GENERAL:alert, healthy, no distress, well nourished, well developed, anxious and comfortable SKIN: skin color, texture, turgor are normal, positive for: follicular type rash.  Small, hardened pustules that are erythematous. HEAD: Normocephalic, No masses, lesions, tenderness or abnormalities EYES: normal EARS: External ears normal OROPHARYNX:not examined  NECK: trachea midline LYMPH:  No epitrochlear nodes BREAST:not examined LUNGS: clear to auscultation and percussion HEART: regular rate & rhythm, no murmurs, no gallops, S1 normal and S2 normal ABDOMEN:abdomen soft, non-tender and normal bowel sounds BACK: Back symmetric, no curvature., No CVA tenderness EXTREMITIES:less then 2 second capillary refill, no joint deformities, effusion, or inflammation, no edema, no skin discoloration, no clubbing  NEURO: alert & oriented x 3 with fluent speech, no focal motor/sensory deficits, gait normal    LABORATORY DATA: Lab Results  Component Value Date   WBC 12.8* 04/17/2011   HGB 13.7 04/17/2011   HCT 42.8 04/17/2011   MCV 83.1 04/17/2011   PLT  66* 04/17/2011     Chemistry      Component Value Date/Time   NA 139 04/17/2011 1134   K 4.2 04/17/2011 1134   CL 101 04/17/2011 1134   CO2 31 04/17/2011 1134   BUN 15 04/17/2011 1134   CREATININE 0.97  04/17/2011 1134      Component Value Date/Time   CALCIUM 9.9 04/17/2011 1134   ALKPHOS 60 04/07/2011 0900   AST 16 04/07/2011 0900   ALT 32 04/07/2011 0900   BILITOT 0.2* 04/07/2011 0900       PATHOLOGY: 1. Negative Bone Marrow aspiration and biopsy    ASSESSMENT:  1. Refractory ITP 2. Chronic Fatigue 3. H/O Iron Deficiency Anemia   PLAN:  1. Refer the patient to Elmer Sow who is a hematologist for a second opinion 2. Patient discussed with Dr. Jerrye Noble via a telephone call by Dr. [name]. 3. Patient will return on Tuesday for a CBC 4. Patient will return for follow-up on Tuesday 5. Will fill out paperwork for short-term disability.   All questions were answered. The patient knows to call the clinic with any problems, questions or concerns. We can certainly see the patient much sooner if necessary.  The patient and plan discussed with Everardo All, MD and he is in agreement with the aforementioned.  I spent 25 minutes counseling the patient face to face. The total time spent in the appointment was 40 minutes.  KEFALAS,THOMAS

## 2011-04-17 NOTE — Patient Instructions (Addendum)
[facility]  Discharge Instructions  RECOMMENDATIONS MADE BY THE CONSULTANT AND ANY TEST RESULTS WILL BE SENT TO YOUR REFERRING DOCTOR.   EXAM FINDINGS BY MD TODAY AND SIGNS AND SYMPTOMS TO REPORT TO CLINIC OR PRIMARY MD: Platelet count is higher. Continue prednisone as ordered. MEDICATIONS PRESCRIBED:Doxycycline 1 daily for 7 days.  INSTRUCTIONS GIVEN AND DISCUSSED: Other :  If bleeding occurs go to ED let them know that your platelets have been low SPECIAL INSTRUCTIONS/FOLLOW-UP: Return to Clinic on as scheduled.   I acknowledge that I have been informed and understand all the instructions given to me and received a copy. I do not have any more questions at this time, but understand that I may call the Specialty Clinic at [facility] at [phone] during business hours should I have any further questions or need assistance in obtaining follow-up care.    __________________________________________  _____________  __________ Signature of Patient or Authorized Representative            Date                   Time    __________________________________________ Nurse's Signature

## 2011-04-17 NOTE — Progress Notes (Signed)
Labs drawn today for cbc/diff , sed rate

## 2011-04-21 ENCOUNTER — Ambulatory Visit (HOSPITAL_COMMUNITY): Payer: BC Managed Care – PPO | Admitting: Oncology

## 2011-04-21 ENCOUNTER — Encounter (HOSPITAL_BASED_OUTPATIENT_CLINIC_OR_DEPARTMENT_OTHER): Payer: BC Managed Care – PPO

## 2011-04-21 ENCOUNTER — Encounter (HOSPITAL_COMMUNITY): Payer: Self-pay | Admitting: Oncology

## 2011-04-21 ENCOUNTER — Encounter (HOSPITAL_BASED_OUTPATIENT_CLINIC_OR_DEPARTMENT_OTHER): Payer: BC Managed Care – PPO | Admitting: Oncology

## 2011-04-21 VITALS — BP 136/104 | HR 94 | Temp 98.3°F | Ht 64.0 in | Wt 184.4 lb

## 2011-04-21 DIAGNOSIS — D693 Immune thrombocytopenic purpura: Secondary | ICD-10-CM

## 2011-04-21 DIAGNOSIS — G4700 Insomnia, unspecified: Secondary | ICD-10-CM

## 2011-04-21 DIAGNOSIS — F19988 Other psychoactive substance use, unspecified with other psychoactive substance-induced disorder: Secondary | ICD-10-CM

## 2011-04-21 LAB — CBC
HCT: 43.2 % (ref 36.0–46.0)
Hemoglobin: 13.9 g/dL (ref 12.0–15.0)
MCH: 26.5 pg (ref 26.0–34.0)
MCHC: 32.2 g/dL (ref 30.0–36.0)
MCV: 82.3 fL (ref 78.0–100.0)
Platelets: 193 10*3/uL (ref 150–400)
RBC: 5.25 MIL/uL — ABNORMAL HIGH (ref 3.87–5.11)
RDW: 14.9 % (ref 11.5–15.5)
WBC: 12.8 10*3/uL — ABNORMAL HIGH (ref 4.0–10.5)

## 2011-04-21 MED ORDER — ALPRAZOLAM 0.5 MG PO TABS: ORAL_TABLET | ORAL | Status: DC

## 2011-04-21 NOTE — Progress Notes (Signed)
Labs drawn today for cbc 

## 2011-04-21 NOTE — Progress Notes (Signed)
Summer Kilts, MD [id number] [place] [address]  1. ITP (idiopathic thrombocytopenic purpura)  CBC  2. Insomnia  ALPRAZolam (XANAX) 0.5 MG tablet    CURRENT THERAPY: S/P multiple therapies including IVIG, Rituxan, 28 day trial of high dose steroids.  Most recently, the patient is S/P splenectomy on 02/17/11.  She is now on high dose Prednisone.  Thus far, no therapies have had a long-lasting effect on platelet count.    INTERVAL HISTORY: Summer Cook 45 y.o. female returns for followup of ITP.  Today she tells me that the thought of not being able to work has emotionally be affecting her.  She does not want to be on disability.  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  I explained to her that for the interim, it would behoove her to be on short-term disability.  She recently saw Dr. Roxy Manns at [facility].  We are awaiting his note or telephone call.  In the meantime, we will anticipate keeping the patient on high dose Prednisone for a 28 day trial until.  The patient reports an inability to rest, likely secondary to the steroids.  She requests a Rx for Xanax or something to help her calm down and rest better.  A good portion of our discussion today was spent on validating her emotional feelings and validating her concerns regarding disability.  Past Medical History  Diagnosis Date  . Hypertension   . ITP (idiopathic thrombocytopenic purpura)   . Platelets decreased   . Depression     has Hypertension and ITP (idiopathic thrombocytopenic purpura) on her problem list.     is allergic to decadron.  Ms. Angelle does not currently have medications on file.  Past Surgical History  Procedure Date  . Cholecystectomy   . Bone marrow biopsy   . Splenectomy, total     Denies any headaches, dizziness, double vision, fevers, chills, night sweats, nausea, vomiting, diarrhea, constipation, chest  pain, heart palpitations, shortness of breath, blood in stool, black tarry stool, urinary pain, urinary burning, urinary frequency, hematuria.   PHYSICAL EXAMINATION  ECOG PERFORMANCE STATUS: 1 - Symptomatic but completely ambulatory  Filed Vitals:   04/21/11 0915  BP: 136/104  Pulse: 94  Temp: 98.3 F (36.8 C)    GENERAL:alert, no distress, well nourished, well developed, anxious, comfortable and cooperative SKIN: skin color, texture, turgor are normal, no rashes or significant lesions HEAD: Normocephalic, No masses, lesions, tenderness or abnormalities EYES: normal EARS: External ears normal OROPHARYNX:not examined  NECK: supple, trachea midline LYMPH:  No epitrochlear nodes noted BREAST:not examined LUNGS: clear to auscultation and percussion HEART: regular rate & rhythm, no murmurs, no gallops, S1 normal and S2 normal ABDOMEN:abdomen soft, non-tender, normal bowel sounds, well healed laparoscopic scars from splenectomy, and no hepatosplenomegaly BACK: Back symmetric, no curvature., No CVA tenderness EXTREMITIES:less then 2 second capillary refill, no joint deformities, effusion, or inflammation  NEURO: alert & oriented x 3 with fluent speech, no focal motor/sensory deficits, gait normal    LABORATORY DATA: Lab Results  Component Value Date   PLT 193 04/21/2011   Lab Results  Component Value Date   WBC 12.8* 04/21/2011   HGB 13.9 04/21/2011   HCT 43.2 04/21/2011   MCV 82.3 04/21/2011   PLT 193 04/21/2011      ASSESSMENT:  1. Severe, refractory ITP, S/P IVIg, Rituxan, High dose steroids; with no long-lasting results.  The patient is  now S/P splenectomy on 02/17/11 with a slight increase and stabilization in platelet count.  She then bottomed out and is now on high dose Prednisone with a nice response.  We will anticipate a 28 day trial of steroids unless other recommendations are provided by Dr. Roxy Manns.  2. 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    PLAN:  1. Rx for  0.5 mg Xanax #45 provided to the patient.  She is to take 1-2 tabs at HS. 2. We will continue with an anticipated 28 day trial of steroids unless other recommendations are provided by Dr. Roxy Manns. 3. CBC in one week. 4. Return to the clinic in one week for follow-up    All questions were answered. The patient knows to call the clinic with any problems, questions or concerns. We can certainly see the patient much sooner if necessary.  The patient and plan discussed with Everardo All, MD and he is in agreement with the aforementioned.  I spent 25 minutes counseling the patient face to face. The total time spent in the appointment was 40 minutes.  Summer Cook

## 2011-04-24 ENCOUNTER — Encounter (HOSPITAL_COMMUNITY): Payer: BC Managed Care – PPO

## 2011-04-27 ENCOUNTER — Telehealth (HOSPITAL_COMMUNITY): Payer: Self-pay

## 2011-04-27 NOTE — Telephone Encounter (Signed)
Will evaluate when she is seen in clinic tomorrow

## 2011-04-27 NOTE — Telephone Encounter (Signed)
Call from patient with complaint of swelling in ankles and feet.  States began on Saturday.  Leaves slight indention in skin when presses with finger.  Able to walk without difficulty but legs are sore.  Has been keeping legs elevated some.  Has appointment for labs and to see PA 7/31.  Wants to know if there's anything she should do.

## 2011-04-28 ENCOUNTER — Encounter (HOSPITAL_BASED_OUTPATIENT_CLINIC_OR_DEPARTMENT_OTHER): Payer: BC Managed Care – PPO

## 2011-04-28 ENCOUNTER — Encounter (HOSPITAL_COMMUNITY): Payer: Self-pay | Admitting: Oncology

## 2011-04-28 ENCOUNTER — Encounter (HOSPITAL_BASED_OUTPATIENT_CLINIC_OR_DEPARTMENT_OTHER): Payer: BC Managed Care – PPO | Admitting: Oncology

## 2011-04-28 VITALS — BP 162/103 | HR 93 | Temp 98.0°F | Wt 188.6 lb

## 2011-04-28 DIAGNOSIS — D693 Immune thrombocytopenic purpura: Secondary | ICD-10-CM

## 2011-04-28 LAB — CBC
HCT: 42.1 % (ref 36.0–46.0)
MCH: 26.5 pg (ref 26.0–34.0)
MCHC: 32.3 g/dL (ref 30.0–36.0)
MCV: 81.9 fL (ref 78.0–100.0)
Platelets: 252 10*3/uL (ref 150–400)
RDW: 15.5 % (ref 11.5–15.5)
WBC: 17.4 10*3/uL — ABNORMAL HIGH (ref 4.0–10.5)

## 2011-04-28 MED ORDER — SPIRONOLACTONE 25 MG PO TABS: 25.0000 mg | ORAL_TABLET | Freq: Every day | ORAL | Status: DC

## 2011-04-28 MED ORDER — OMEPRAZOLE 20 MG PO CPDR: 20.0000 mg | DELAYED_RELEASE_CAPSULE | Freq: Every day | ORAL | Status: DC

## 2011-04-28 NOTE — Patient Instructions (Signed)
[facility]  Discharge Instructions  RECOMMENDATIONS MADE BY THE CONSULTANT AND ANY TEST RESULTS WILL BE SENT TO YOUR REFERRING DOCTOR.     MEDICATIONS PRESCRIBED:Spironolactone RX called in and Prilosec RX called in.  INSTRUCTIONS GIVEN AND DISCUSSED: Prednisone taper as follows starting tomorrow: 60mg  daily x 3 days, 40mg  daily x 3 days, 20mg  daily x 3 days, 10mg  daily x 3 days then 5mg  daily x 3 days and STOP.  SPECIAL INSTRUCTIONS/FOLLOW-UP: Labs weekly. MD appt in 2 weeks.   I acknowledge that I have been informed and understand all the instructions given to me and received a copy. I do not have any more questions at this time, but understand that I may call the Specialty Clinic at [facility] at [phone] during business hours should I have any further questions or need assistance in obtaining follow-up care.    __________________________________________  _____________  __________ Signature of Patient or Authorized Representative            Date                   Time    __________________________________________ Nurse's Signature

## 2011-04-28 NOTE — Progress Notes (Signed)
[name], MD [id number] [place] [address]  1. ITP (idiopathic thrombocytopenic purpura)  CBC, CBC, CBC, CBC    CURRENT THERAPY: Presently on Prednisone.  Will taper starting tomorrow. S/P 28 day trial of high dose steroids, IVIg, Rituxan, and Splenectomy.  INTERVAL HISTORY: Summer Cook 45 y.o. female returns for  regular  visit for followup of refractory ITP.  The patient admits to acid reflux, weight gain, increased appetite, and concerns about her financial situation since she is not working secondary to thrombocytopenia.    The patient's above mentioned complaints are likely secondary to the high dose prednisone.  I will e-prescribe a Rx for Prilosec 20 mg daily.  The patient also complains of LE trace pitting edema.  She is on a blood pressure medication that contains HCTZ.  I will e-prescribe 10 tabs of spironolactone.  She can take this daily until edema resolves.  I reviewed Dr. Guy Sandifer note from [facility].  We referred the patient to him for a second opinion.  He recommended doing dexamethasone 4-day pulses every 28 days.  As a result, we will taper the prednisone and initiate these pulses beginning the 1st of September.  We can certainly change that plan if her platelet counts drop significantly.  The patient is worried about her blood pressure, but I would like to defer her HTN until after she is tapered off from her prednisone.  This is likely secondary to the high dose steroids.  Past Medical History  Diagnosis Date  . Hypertension   . ITP (idiopathic thrombocytopenic purpura)   . Platelets decreased   . Depression     has Hypertension and ITP (idiopathic thrombocytopenic purpura) on her problem list.     is allergic to decadron.  Ms. Eckelkamp does not currently have medications on file.  Past Surgical History  Procedure Date  . Cholecystectomy   . Bone marrow biopsy   . Splenectomy, total     Denies any  headaches, dizziness, double vision, fevers, chills, night sweats, nausea, vomiting, constipation, chest pain, shortness of breath, blood in stool, black tarry stool, urinary pain, urinary burning, urinary frequency, hematuria.   PHYSICAL EXAMINATION  Filed Vitals:   04/28/11 1156  BP: 162/103  Pulse: 93  Temp: 98 F (36.7 C)    GENERAL:alert, no distress, well nourished, well developed, comfortable and cooperative SKIN: skin color, texture, turgor are normal, folliculitis type rash on back likely secondary to steroids. HEAD: Normocephalic, No masses, lesions, tenderness or abnormalities EYES: normal, PERRLA EARS: External ears normal OROPHARYNX:mucous membranes are moist  NECK: supple, no adenopathy, no bruits, no JVD, thyroid normal size, non-tender, without nodularity, no stridor, non-tender, trachea midline LYMPH:  no palpable lymphadenopathy, no hepatosplenomegaly BREAST:not examined LUNGS: clear to auscultation and percussion HEART: regular rate & rhythm, no murmurs, no gallops, S1 normal and S2 normal ABDOMEN:abdomen soft, non-tender, normal bowel sounds and healed laparoscopic scars from splenectomy BACK: Back symmetric, no curvature., No CVA tenderness EXTREMITIES:less then 2 second capillary refill, no joint deformities, effusion, or inflammation, no skin discoloration, no clubbing, no cyanosis, trace B/L pitting LE  Edema. NEURO: alert & oriented x 3 with fluent speech, no focal motor/sensory deficits, gait normal  LABORATORY DATA: Lab Results  Component Value Date   WBC 17.4* 04/28/2011   HGB 13.6 04/28/2011   HCT 42.1 04/28/2011   MCV 81.9 04/28/2011   PLT 252 04/28/2011   ASSESSMENT:  1. ITP, on 80 mg Prednisone daily presently.  Will begin Taper tomorrow. 2. H/O Iron Deficiency Anemia 3. S/P Splenectomy   PLAN:  1. Will begin tapering Prednisone tomorrow.  Will take 60 mg x 3 days, 40 mg x 3 days, 20 mg x 3 days, 10 mg x 3 days, 5 mg x 3 days, and then hold the  prednisone. 2. Will monitor platelet count weekly.  Labs ordered weekly for 4 weeks. 3. Will anticipate initiating Dexamethasone 40 mg daily for 4 days every 4 weeks starting September 1st.  This date can be changed pending lab results. 4. Return in 2 weeks for follow-up. 5. E-prescribed Spironolactone 25 mg #10 without refills.  Take one tab daily until LE edema resolves. 6. E-prescribed Prilosec 20 mg #45 with 2 refills.  Take one capsule daily.   All questions were answered. The patient knows to call the clinic with any problems, questions or concerns. We can certainly see the patient much sooner if necessary.  I spent 25 minutes counseling the patient face to face. The total time spent in the appointment was 40 minutes.  KEFALAS,THOMAS

## 2011-04-28 NOTE — Progress Notes (Signed)
Labs drawn today for cbc 

## 2011-04-30 ENCOUNTER — Emergency Department (HOSPITAL_COMMUNITY): Payer: BC Managed Care – PPO

## 2011-04-30 ENCOUNTER — Other Ambulatory Visit: Payer: Self-pay

## 2011-04-30 ENCOUNTER — Encounter (HOSPITAL_COMMUNITY): Payer: Self-pay | Admitting: *Deleted

## 2011-04-30 ENCOUNTER — Emergency Department (HOSPITAL_COMMUNITY)
Admission: EM | Admit: 2011-04-30 | Discharge: 2011-04-30 | Disposition: A | Discharge status: Home / Self Care | Payer: BC Managed Care – PPO | Attending: Emergency Medicine | Admitting: Emergency Medicine

## 2011-04-30 DIAGNOSIS — Z87891 Personal history of nicotine dependence: Secondary | ICD-10-CM | POA: Insufficient documentation

## 2011-04-30 DIAGNOSIS — Z8049 Family history of malignant neoplasm of other genital organs: Secondary | ICD-10-CM | POA: Insufficient documentation

## 2011-04-30 DIAGNOSIS — Z801 Family history of malignant neoplasm of trachea, bronchus and lung: Secondary | ICD-10-CM | POA: Insufficient documentation

## 2011-04-30 DIAGNOSIS — R609 Edema, unspecified: Secondary | ICD-10-CM

## 2011-04-30 DIAGNOSIS — I10 Essential (primary) hypertension: Secondary | ICD-10-CM | POA: Insufficient documentation

## 2011-04-30 LAB — CBC
MCH: 26.1 pg (ref 26.0–34.0)
MCV: 82 fL (ref 78.0–100.0)
Platelets: 256 10*3/uL (ref 150–400)
RDW: 15.5 % (ref 11.5–15.5)
WBC: 14.4 10*3/uL — ABNORMAL HIGH (ref 4.0–10.5)

## 2011-04-30 LAB — DIFFERENTIAL
Basophils Absolute: 0 10*3/uL (ref 0.0–0.1)
Eosinophils Absolute: 0.1 10*3/uL (ref 0.0–0.7)
Eosinophils Relative: 1 % (ref 0–5)

## 2011-04-30 LAB — BASIC METABOLIC PANEL
CO2: 25 mEq/L (ref 19–32)
Calcium: 9 mg/dL (ref 8.4–10.5)
Creatinine, Ser: 0.98 mg/dL (ref 0.50–1.10)
GFR calc non Af Amer: >60 mL/min (ref >60)
Glucose, Bld: 77 mg/dL (ref 70–99)

## 2011-04-30 LAB — CK TOTAL AND CKMB (NOT AT ARMC): Total CK: 28 U/L (ref 7–177)

## 2011-04-30 MED ORDER — OXYCODONE-ACETAMINOPHEN 5-325 MG PO TABS
ORAL_TABLET | ORAL | Status: AC
  Administered 2011-04-30: 1 via ORAL
  Filled 2011-04-30: qty 1

## 2011-04-30 MED ORDER — OXYCODONE-ACETAMINOPHEN 5-325 MG PO TABS
1.0000 | ORAL_TABLET | Freq: Once | ORAL | Status: AC
  Administered 2011-04-30: 1 via ORAL

## 2011-04-30 MED ORDER — MORPHINE SULFATE 4 MG/ML IJ SOLN
4.0000 mg | Freq: Once | INTRAMUSCULAR | Status: AC
  Administered 2011-04-30: 4 mg via INTRAVENOUS
  Filled 2011-04-30: qty 1

## 2011-04-30 MED ORDER — ONDANSETRON HCL 4 MG/2ML IJ SOLN
4.0000 mg | Freq: Once | INTRAMUSCULAR | Status: AC
  Administered 2011-04-30: 4 mg via INTRAVENOUS
  Filled 2011-04-30: qty 2

## 2011-04-30 MED ORDER — SODIUM CHLORIDE 0.9 % IV SOLN
INTRAVENOUS | Status: DC
  Administered 2011-04-30: 12:00:00 via INTRAVENOUS

## 2011-04-30 MED ORDER — FUROSEMIDE 20 MG PO TABS: 20.0000 mg | ORAL_TABLET | Freq: Every day | ORAL | Status: DC

## 2011-04-30 NOTE — ED Provider Notes (Signed)
History     CSN: [id number] Arrival date & time: 04/30/2011  9:21 AM  Chief Complaint  Patient presents with  . Leg Pain   Patient is a 45 y.o. female presenting with leg pain. The history is provided by the patient.  Leg Pain  The incident occurred yesterday. The incident occurred at home (she describes gradual worsening pain in her bilateral legs from her hips down.  SHe deneis injury.  Describes aching with intermittent fleeting migratory stabs of pain.). There was no injury mechanism. The pain is at a severity of 8/10. The pain is moderate. Pain course: waxing and waning. Pertinent negatives include no numbness and no tingling. Associated symptoms comments: Peripheral edema.. She has tried nothing for the symptoms.    Past Medical History  Diagnosis Date  . Hypertension   . ITP (idiopathic thrombocytopenic purpura)   . Platelets decreased   . Depression     Past Surgical History  Procedure Date  . Cholecystectomy   . Bone marrow biopsy   . Splenectomy, total     Family History  Problem Relation Age of Onset  . Cancer Mother   . Cervical cancer Mother   . Cancer Father   . Lung cancer Father   . Pneumonia Brother     History  Substance Use Topics  . Smoking status: Former Research scientist (life sciences)  . Smokeless tobacco: Never Used  . Alcohol Use: No    OB History    Grav Para Term Preterm Abortions TAB SAB Ect Mult Living                  Review of Systems  Constitutional: Negative for fever.  HENT: Negative for congestion, sore throat and neck pain.   Eyes: Negative.   Respiratory: Negative for chest tightness and shortness of breath.   Cardiovascular: Negative for chest pain.  Gastrointestinal: Negative for nausea and abdominal pain.  Genitourinary: Negative.   Musculoskeletal: Negative for joint swelling and arthralgias.  Skin: Negative.  Negative for rash and wound.  Neurological: Negative for dizziness, tingling, weakness, light-headedness, numbness and headaches.    Hematological: Negative.   Psychiatric/Behavioral: Negative.     Physical Exam  BP 144/97  Pulse 95  Temp(Src) 98.4 F (36.9 C) (Oral)  Resp 18  Ht 5\' 5"  (1.651 m)  Wt 190 lb (86.183 kg)  BMI 31.62 kg/m2  SpO2 96%  Physical Exam  Vitals reviewed. Constitutional: She is oriented to person, place, and time. She appears well-developed and well-nourished.  HENT:  Head: Normocephalic and atraumatic.  Eyes: Conjunctivae are normal.  Neck: Normal range of motion.  Cardiovascular: Normal rate, regular rhythm, normal heart sounds and intact distal pulses.   Pulmonary/Chest: Effort normal and breath sounds normal. She has no wheezes.  Abdominal: Soft. Bowel sounds are normal. There is no tenderness.  Musculoskeletal: She exhibits tenderness. She exhibits no edema.       Generalized pain to even light palpation of bilateral lower extremities.  No rash,  No pitting edema,  No erythema.  Pedal pulses intact and normal.  Neurological: She is alert and oriented to person, place, and time. She displays normal reflexes. No cranial nerve deficit. She exhibits normal muscle tone.  Skin: Skin is warm and dry. No rash noted. No erythema.  Psychiatric: She has a normal mood and affect.    ED Course  Procedures  MDM   Patient had cbc drawn 2 days ago by cancer clinic,  Reviewed,  Platelets normal range.  Fulton Reek, PA 04/30/11 [place], PA 04/30/11 1422

## 2011-04-30 NOTE — ED Notes (Signed)
Crackers and diet coke given to pt per pt request.

## 2011-04-30 NOTE — ED Provider Notes (Signed)
History     CSN: [id number] Arrival date & time: 04/30/2011  9:21 AM  Chief Complaint  Patient presents with  . Leg Pain   HPI  Past Medical History  Diagnosis Date  . Hypertension   . ITP (idiopathic thrombocytopenic purpura)   . Platelets decreased   . Depression     Past Surgical History  Procedure Date  . Cholecystectomy   . Bone marrow biopsy   . Splenectomy, total     Family History  Problem Relation Age of Onset  . Cancer Mother   . Cervical cancer Mother   . Cancer Father   . Lung cancer Father   . Pneumonia Brother     History  Substance Use Topics  . Smoking status: Former Research scientist (life sciences)  . Smokeless tobacco: Never Used  . Alcohol Use: No    OB History    Grav Para Term Preterm Abortions TAB SAB Ect Mult Living                  Review of Systems  Physical Exam  BP 144/97  Pulse 95  Temp(Src) 98.4 F (36.9 C) (Oral)  Resp 18  Ht 5\' 5"  (1.651 m)  Wt 190 lb (86.183 kg)  BMI 31.62 kg/m2  SpO2 96%  Physical Exam  ED Course  Procedures  MDM Patient with edema from prednisone with increased pain from swelling.  Neurovascular exam intact bilateral lower extremities.  Plan low dose lasix and follow up with pmd.      Shaune Pollack, MD 04/30/11 1304

## 2011-04-30 NOTE — ED Notes (Signed)
Pt in xray

## 2011-04-30 NOTE — ED Notes (Signed)
Pt reports has been on prednisone and has had problems with lower legs swelling.  Reports the past 2 days legs have become increasingly swollen and aching from hips down.  ALso reports has felt heart fluttering and had SOB.  Denies Chest pain.

## 2011-04-30 NOTE — ED Notes (Signed)
Pt c/o pain in both legs since last night. States that both legs have been swollen chronically but she started having shooting pains in her legs.

## 2011-05-01 ENCOUNTER — Encounter (HOSPITAL_COMMUNITY): Payer: BC Managed Care – PPO

## 2011-05-01 ENCOUNTER — Telehealth (HOSPITAL_COMMUNITY): Payer: Self-pay | Admitting: *Deleted

## 2011-05-01 NOTE — Telephone Encounter (Signed)
Pt. Called and reported that she had hit her head, no knot or bleeding, but area is sore. Her last platelet count was 265,000. She does not want to go to ED. I advised her to have someone stay with her for the next 24 hrs, and if any change in LOC, blurred vision, headache, etc. To report immediately to ED. Verbalized understanding.

## 2011-05-01 NOTE — ED Provider Notes (Signed)
I saw the patient , received history, and examined the patient and am in agreement with the plan.  Shaune Pollack, MD 05/01/11 [phone]

## 2011-05-05 ENCOUNTER — Encounter (HOSPITAL_COMMUNITY): Payer: BC Managed Care – PPO | Attending: Oncology

## 2011-05-05 DIAGNOSIS — D693 Immune thrombocytopenic purpura: Secondary | ICD-10-CM | POA: Insufficient documentation

## 2011-05-05 DIAGNOSIS — F329 Major depressive disorder, single episode, unspecified: Secondary | ICD-10-CM | POA: Insufficient documentation

## 2011-05-05 DIAGNOSIS — F3289 Other specified depressive episodes: Secondary | ICD-10-CM | POA: Insufficient documentation

## 2011-05-05 DIAGNOSIS — M7989 Other specified soft tissue disorders: Secondary | ICD-10-CM | POA: Insufficient documentation

## 2011-05-05 DIAGNOSIS — B370 Candidal stomatitis: Secondary | ICD-10-CM | POA: Insufficient documentation

## 2011-05-05 LAB — CBC
HCT: 42.3 % (ref 36.0–46.0)
MCH: 26.2 pg (ref 26.0–34.0)
MCHC: 32.4 g/dL (ref 30.0–36.0)
MCV: 81 fL (ref 78.0–100.0)
RDW: 15.5 % (ref 11.5–15.5)

## 2011-05-05 NOTE — Progress Notes (Signed)
Labs drawn today for cbc 

## 2011-05-06 ENCOUNTER — Telehealth (HOSPITAL_COMMUNITY): Payer: Self-pay | Admitting: *Deleted

## 2011-05-08 ENCOUNTER — Other Ambulatory Visit (HOSPITAL_COMMUNITY): Payer: Self-pay | Admitting: Oncology

## 2011-05-08 ENCOUNTER — Encounter (HOSPITAL_COMMUNITY): Payer: BC Managed Care – PPO

## 2011-05-08 ENCOUNTER — Ambulatory Visit (HOSPITAL_COMMUNITY)
Admission: RE | Admit: 2011-05-08 | Discharge: 2011-05-08 | Disposition: A | Discharge status: Home / Self Care | Payer: BC Managed Care – PPO | Source: Ambulatory Visit | Attending: Oncology | Admitting: Oncology

## 2011-05-08 ENCOUNTER — Encounter (HOSPITAL_BASED_OUTPATIENT_CLINIC_OR_DEPARTMENT_OTHER): Payer: BC Managed Care – PPO | Admitting: Oncology

## 2011-05-08 VITALS — BP 145/108 | HR 102 | Temp 98.5°F | Wt 187.2 lb

## 2011-05-08 DIAGNOSIS — D693 Immune thrombocytopenic purpura: Secondary | ICD-10-CM

## 2011-05-08 DIAGNOSIS — R599 Enlarged lymph nodes, unspecified: Secondary | ICD-10-CM | POA: Insufficient documentation

## 2011-05-08 DIAGNOSIS — Z139 Encounter for screening, unspecified: Secondary | ICD-10-CM

## 2011-05-08 DIAGNOSIS — M7989 Other specified soft tissue disorders: Secondary | ICD-10-CM

## 2011-05-08 DIAGNOSIS — H5710 Ocular pain, unspecified eye: Secondary | ICD-10-CM

## 2011-05-08 MED ORDER — DEXAMETHASONE 4 MG PO TABS: ORAL_TABLET | ORAL | Status: DC

## 2011-05-08 NOTE — Patient Instructions (Signed)
[facility]  Discharge Instructions  RECOMMENDATIONS MADE BY THE CONSULTANT AND ANY TEST RESULTS WILL BE SENT TO YOUR REFERRING DOCTOR.   EXAM FINDINGS BY MD TODAY AND SIGNS AND SYMPTOMS TO REPORT TO CLINIC OR PRIMARY MD:  To X-ray for u/s of left axilla. Talked with Dr. Baird Cancer re: your eyes, he thinks it just sounds like dry eyes. Use artificial tears (otc). Call if no better by Monday, and we may get you in to see eye doctor.  MEDICATIONS PRESCRIBED:  [place] prednisone as scheduled. Will start dexamethasone on Sept 1st. Take with food or milk Take dyazide twice a day. Xanax 1 mg #45 take 1 at hs then 1/2 prn for anxiety    SPECIAL INSTRUCTIONS/FOLLOW-UP: As scheduled Lab work Needed  Tuesday as scheduled.   I acknowledge that I have been informed and understand all the instructions given to me and received a copy. I do not have any more questions at this time, but understand that I may call the [facility] at [facility] at [phone] during business hours should I have any further questions or need assistance in obtaining follow-up care.    __________________________________________  _____________  __________ Signature of Patient or Authorized Representative            Date                   Time    __________________________________________ Nurse's Signature

## 2011-05-08 NOTE — Progress Notes (Signed)
Summer Kilts, MD [id number] [place] [address]  1. Axillary swelling  [place] Chest  2. Eye pain      CURRENT THERAPY:Tapering off of Prednisone.  Will then perform 4 day pulses of Dexamethasone monthly.  INTERVAL HISTORY: CHI CRISE 45 y.o. female walk-in with B/L eye pain/discomfort and Left axillary swelling.   She reports that a few days ago she noticed some B/L eye pain.  She notes that on gazing upwards, her eye pain increases.  She is noted squinting slightly.  Clear fluid/tears are appreciated at the medial corner of her eyes.  She notes that lately, she has been having to remove her glasses occasionally to see better.   She appreciates left axillary swelling with tenderness.  She reports this occuring yesterday.    Past Medical History  Diagnosis Date  . Hypertension   . ITP (idiopathic thrombocytopenic purpura)   . Platelets decreased   . Depression     has Hypertension and ITP (idiopathic thrombocytopenic purpura) on her problem list.     is allergic to decadron.  Ms. Almasri does not currently have medications on file.  Past Surgical History  Procedure Date  . Cholecystectomy   . Bone marrow biopsy   . Splenectomy, total      denies any headaches, dizziness, double vision, fevers, chills, night sweats, nausea, vomiting, diarrhea, constipation, chest pain, heart palpitations, shortness of breath, blood in stool, black tarry stool, urinary pain, urinary burning, urinary frequency, hematuria.   PHYSICAL EXAMINATION  ECOG PERFORMANCE STATUS: 1 - Symptomatic but completely ambulatory  Filed Vitals:   05/08/11 1215  BP: 145/108  Pulse: 102  Temp: 98.5 F (36.9 C)    GENERAL:alert, no distress, well nourished, well developed, comfortable and cooperative SKIN: skin color, texture, turgor are normal, no rashes or significant lesions HEAD: Normocephalic, No masses, lesions, tenderness or abnormalities EYES: No erythema noted.   No sty appreciated.  Conjunctiva are clear without increase erythema.  Positive tearing appreciated.  No signs of infection.  No abnormal discharge.  No tenderness to palpation with eyelid closed. EARS: External ears normal OROPHARYNX:mucous membranes are moist  NECK: trachea midline LYMPH:  not examined BREAST:not examined LUNGS: not examined HEART: not examined ABDOMEN:not examined BACK: Back symmetric, no curvature. EXTREMITIES:positive findings:  Slight/minimal left axillary edema.  No lymph nodes appreciated.  Ho erythema or heat.  No lesions or masses  NEURO: alert & oriented x 3 with fluent speech, no focal motor/sensory deficits, gait normal    LABORATORY DATA: CBC    Component Value Date/Time   WBC 11.1* 05/05/2011 0922   RBC 5.22* 05/05/2011 0922   HGB 13.7 05/05/2011 0922   HCT 42.3 05/05/2011 0922   PLT 186 05/05/2011 0922   MCV 81.0 05/05/2011 0922   MCH 26.2 05/05/2011 0922   MCHC 32.4 05/05/2011 0922   RDW 15.5 05/05/2011 0922   LYMPHSABS 1.1 04/30/2011 1017   MONOABS 1.2* 04/30/2011 1017   EOSABS 0.1 04/30/2011 1017   BASOSABS 0.0 04/30/2011 1017      RADIOGRAPHIC STUDIES: Left axilla ultrasound ordered     ASSESSMENT:  1. Dry eyes 2. B/L eye pain 3. Slight/trace/minimal left axillary swelling   PLAN:  1. Spoke with Dr. Sherlynn Stalls, Ophthalmologist, regarding this case.  He explains that the patient sounds as if she has dry eyes with sympathetic compensation with the increased tearing.  He reports that anything worrisome would not appear in a bilateral fashion.  He  reports that he would be happy to see the patient.  I will offer her this referral. 2. Ultrasound of left axilla to evaluate for lymphadenopathy 3. OTC artificial tears. 4. Return as scheduled. 5. Dexamethasone for pulse beginning on September 1st e-prescribed to [facility].   All questions were answered. The patient knows to call the clinic with any problems, questions or concerns. We can certainly  see the patient much sooner if necessary.  The patient discussed with Dr. Sherlynn Stalls, ophthalmologist regarding eye discomfort.  I spent 25 minutes counseling the patient face to face. The total time spent in the appointment was 40 minutes.  KEFALAS,THOMAS

## 2011-05-09 ENCOUNTER — Other Ambulatory Visit (HOSPITAL_COMMUNITY): Payer: Self-pay | Admitting: Oncology

## 2011-05-12 ENCOUNTER — Ambulatory Visit (HOSPITAL_COMMUNITY): Payer: BC Managed Care – PPO | Admitting: Oncology

## 2011-05-12 ENCOUNTER — Encounter (HOSPITAL_BASED_OUTPATIENT_CLINIC_OR_DEPARTMENT_OTHER): Payer: BC Managed Care – PPO

## 2011-05-12 ENCOUNTER — Ambulatory Visit (HOSPITAL_COMMUNITY)
Admission: RE | Admit: 2011-05-12 | Discharge: 2011-05-12 | Disposition: A | Discharge status: Home / Self Care | Payer: BC Managed Care – PPO | Source: Ambulatory Visit | Attending: Oncology | Admitting: Oncology

## 2011-05-12 DIAGNOSIS — Z1231 Encounter for screening mammogram for malignant neoplasm of breast: Secondary | ICD-10-CM | POA: Insufficient documentation

## 2011-05-12 DIAGNOSIS — D693 Immune thrombocytopenic purpura: Secondary | ICD-10-CM

## 2011-05-12 DIAGNOSIS — Z139 Encounter for screening, unspecified: Secondary | ICD-10-CM

## 2011-05-12 LAB — CBC
MCH: 26.7 pg (ref 26.0–34.0)
MCHC: 32.4 g/dL (ref 30.0–36.0)
Platelets: 28 10*3/uL — CL (ref 150–400)
RDW: 16.4 % — ABNORMAL HIGH (ref 11.5–15.5)

## 2011-05-12 NOTE — Progress Notes (Signed)
Labs drawn today for cbc 

## 2011-05-12 NOTE — Progress Notes (Signed)
none

## 2011-05-15 ENCOUNTER — Encounter (HOSPITAL_COMMUNITY): Payer: BC Managed Care – PPO

## 2011-05-15 ENCOUNTER — Other Ambulatory Visit (HOSPITAL_COMMUNITY): Payer: BC Managed Care – PPO

## 2011-05-18 ENCOUNTER — Ambulatory Visit (HOSPITAL_COMMUNITY): Payer: BC Managed Care – PPO | Admitting: Oncology

## 2011-05-18 ENCOUNTER — Other Ambulatory Visit (HOSPITAL_COMMUNITY): Payer: BC Managed Care – PPO

## 2011-05-19 ENCOUNTER — Other Ambulatory Visit (HOSPITAL_COMMUNITY): Payer: BC Managed Care – PPO

## 2011-05-19 ENCOUNTER — Encounter (HOSPITAL_COMMUNITY): Payer: BC Managed Care – PPO

## 2011-05-19 DIAGNOSIS — D693 Immune thrombocytopenic purpura: Secondary | ICD-10-CM

## 2011-05-19 LAB — CBC
MCV: 80.4 fL (ref 78.0–100.0)
Platelets: 157 10*3/uL (ref 150–400)
RDW: 16 % — ABNORMAL HIGH (ref 11.5–15.5)
WBC: 12.4 10*3/uL — ABNORMAL HIGH (ref 4.0–10.5)

## 2011-05-19 NOTE — Progress Notes (Signed)
Labs drawn today for cbc 

## 2011-05-25 ENCOUNTER — Telehealth (HOSPITAL_COMMUNITY): Payer: Self-pay | Admitting: *Deleted

## 2011-05-25 ENCOUNTER — Encounter (HOSPITAL_COMMUNITY): Payer: BC Managed Care – PPO

## 2011-05-25 DIAGNOSIS — D693 Immune thrombocytopenic purpura: Secondary | ICD-10-CM

## 2011-05-25 LAB — CBC
Hemoglobin: 14.8 g/dL (ref 12.0–15.0)
MCHC: 32.2 g/dL (ref 30.0–36.0)

## 2011-05-25 NOTE — Telephone Encounter (Signed)
Patient notified that she needs to take Colace 3 tabs BID to soften stool since patient complaining of hard stools x 1 week resulting in blood when wiping. Patient notified that if she has increasing blood with stools or starts to bleed to go to ER. Patient verbalized understanding of all instructions.

## 2011-05-25 NOTE — Progress Notes (Signed)
Addended by: Gerhard Perches on: 05/25/2011 11:43 AM   Modules accepted: Orders

## 2011-05-25 NOTE — Progress Notes (Signed)
Labs drawn today for cbc 

## 2011-05-25 NOTE — Progress Notes (Signed)
Patient notified that platelets were 6000 and that Dr. [name] wanted her to start her Decadron today instead of Sept 1. Patient to return on 05/29/11 for CBC. Patient verbalized understanding of all d/c instructions.

## 2011-05-27 ENCOUNTER — Other Ambulatory Visit (HOSPITAL_COMMUNITY): Payer: Self-pay | Admitting: Oncology

## 2011-05-27 DIAGNOSIS — D693 Immune thrombocytopenic purpura: Secondary | ICD-10-CM

## 2011-05-28 ENCOUNTER — Telehealth (HOSPITAL_COMMUNITY): Payer: Self-pay | Admitting: *Deleted

## 2011-05-28 NOTE — Telephone Encounter (Signed)
Message left on answering machine as below. 

## 2011-05-28 NOTE — Telephone Encounter (Signed)
Message copied by Berneta Levins on Thu May 28, 2011  1:48 PM ------      Message from: Baird Cancer III      Created: Wed May 27, 2011  5:34 PM       CBC on Friday.  Call her and let her know that the Dexamethasone is probably kicking in and we can check her platelets on Friday.  After her lab work, have her stick around and I can check her quickly.

## 2011-05-29 ENCOUNTER — Encounter (HOSPITAL_BASED_OUTPATIENT_CLINIC_OR_DEPARTMENT_OTHER): Payer: BC Managed Care – PPO | Admitting: Oncology

## 2011-05-29 ENCOUNTER — Encounter (HOSPITAL_COMMUNITY): Payer: Self-pay | Admitting: Oncology

## 2011-05-29 ENCOUNTER — Encounter (HOSPITAL_BASED_OUTPATIENT_CLINIC_OR_DEPARTMENT_OTHER): Payer: BC Managed Care – PPO

## 2011-05-29 DIAGNOSIS — D693 Immune thrombocytopenic purpura: Secondary | ICD-10-CM

## 2011-05-29 DIAGNOSIS — F329 Major depressive disorder, single episode, unspecified: Secondary | ICD-10-CM

## 2011-05-29 DIAGNOSIS — F32A Depression, unspecified: Secondary | ICD-10-CM

## 2011-05-29 DIAGNOSIS — B370 Candidal stomatitis: Secondary | ICD-10-CM | POA: Insufficient documentation

## 2011-05-29 DIAGNOSIS — M549 Dorsalgia, unspecified: Secondary | ICD-10-CM

## 2011-05-29 LAB — CBC
MCV: 81.3 fL (ref 78.0–100.0)
Platelets: 22 10*3/uL — CL (ref 150–400)
RBC: 5.24 MIL/uL — ABNORMAL HIGH (ref 3.87–5.11)
WBC: 16.9 10*3/uL — ABNORMAL HIGH (ref 4.0–10.5)

## 2011-05-29 MED ORDER — FLUCONAZOLE 100 MG PO TABS: 100.0000 mg | ORAL_TABLET | Freq: Every day | ORAL | Status: DC

## 2011-05-29 MED ORDER — FIRST-DUKES MOUTHWASH MT SUSP: 5.0000 mL | Freq: Four times a day (QID) | OROMUCOSAL | Status: DC

## 2011-05-29 NOTE — Progress Notes (Signed)
Labs drawn today for cbc 

## 2011-05-29 NOTE — Progress Notes (Signed)
[name], MD [id number] [place] [address]  1. Thrush, oral  fluconazole (DIFLUCAN) 100 MG tablet, Diphenhyd-Hydrocort-Nystatin (FIRST-DUKES MOUTHWASH) SUSP  2. Depression  Nursing communication  3. ITP (idiopathic thrombocytopenic purpura)  CBC, CBC, CBC, CBC    CURRENT THERAPY: Dexamethasone 40 mg daily for 4 days every 4 weeks  INTERVAL HISTORY: Summer Cook 45 y.o. female returns as a walk in for severe, refractory ITP.   She reports "spots on my tongue" and white plaques on her buccal mucosa that are sore.  She also reports low back discomfort and depression.  The patient reports that she noticed white spots on her tongue and buccal mucosa approximately 3-4 days ago.  She reports that it is slightly uncomfortable to eat food products.  She is able to eat however.  She explains that when she brushes her teeth, she removes some of the white spots.  She also reports some red "spots" on her tongue.    The patient explains that she is moving to another house/apartment and as a result has been moving materials.  She denies any trauma or heavy lifting.  She explains that some movements increase the pain, remaining in one position increases the pain, but laying down helps alleviate the discomfort.  The discomfort is lateral to the spine in the lumbar region.  It is bilateral.  No point tenderness.  No radiation of the pain.    The patient also explains that she is more depressed as of the last 2 weeks.  She is on 20 mg of Lexpro daily and is compliant with the medication.  She explains that the "roller-coaster ride" with her platelets and the long-term treatment goals are causing her to feel depressed.  She explains that she finds herself crying occasionally.     Past Medical History  Diagnosis Date  . Hypertension   . ITP (idiopathic thrombocytopenic purpura)   . Platelets decreased   . Depression   . Thrush, oral 05/29/2011    has Hypertension;  ITP (idiopathic thrombocytopenic purpura); and Thrush, oral on her problem list.     is allergic to decadron.  Ms. Wi does not currently have medications on file.  Past Surgical History  Procedure Date  . Cholecystectomy   . Bone marrow biopsy   . Splenectomy, total     Denies any headaches, dizziness, double vision, fevers, chills, night sweats, nausea, vomiting, diarrhea, constipation, chest pain, heart palpitations, shortness of breath, blood in stool, black tarry stool, urinary pain, urinary burning, urinary frequency, hematuria.   PHYSICAL EXAMINATION  There were no vitals filed for this visit.  GENERAL:alert, no distress, well nourished, well developed, anxious, comfortable and cooperative SKIN: skin color, texture, turgor are normal HEAD: Normocephalic EYES: normal EARS: External ears normal OROPHARYNX:thrush noted on tongue and buccal mucosa.   NECK: trachea midline LYMPH:  not examined BREAST:not examined LUNGS: not examined HEART: not examiend ABDOMEN:not examined BACK: Back symmetric, no curvature., No CVA tenderness, Range of motion is normal, Mild to moderate spasm of paralumbar muscles, no tenderness to percussion or palpation EXTREMITIES:no joint deformities, effusion, or inflammation, no skin discoloration, no clubbing, no cyanosis  NEURO: alert & oriented x 3 with fluent speech, no focal motor/sensory deficits, gait normal    LABORATORY DATA: CBC    Component Value Date/Time   WBC 16.9* 05/29/2011 0920   RBC 5.24* 05/29/2011 0920   HGB 13.8 05/29/2011 0920   HCT 42.6 05/29/2011 0920   PLT 22*  05/29/2011 0920   MCV 81.3 05/29/2011 0920   MCH 26.3 05/29/2011 0920   MCHC 32.4 05/29/2011 0920   RDW 16.3* 05/29/2011 0920   LYMPHSABS 1.1 04/30/2011 1017   MONOABS 1.2* 04/30/2011 1017   EOSABS 0.1 04/30/2011 1017   BASOSABS 0.0 04/30/2011 1017    ASSESSMENT:  1. Severe, refractory ITP 2. Oral thrush 3. Low back discomfort   PLAN:  1. Tylenol or Ibuprofen  intermittently for low back discomfort.  I recommended 2 tablets of Ibuprofen BID as needed for back pain. 2. Recommended ice/heat for back pain 3. Duke's Magic Mouthwash 5 mL 4 times daily, swish and swallow. 4. Diflucan 100 mg daily x 7 days 5. Refer to Luz Brazen for emotional support 6. Return in 3-4 weeks for follow-up 7. CBC weekly. 8. I personally reviewed and went over laboratory results with the patient.   All questions were answered. The patient knows to call the clinic with any problems, questions or concerns. We can certainly see the patient much sooner if necessary.  The patient and plan discussed with Everardo All, MD and he is in agreement with the aforementioned.  More than 50% of the time spent with the patient was utilized for counseling.   KEFALAS,THOMAS

## 2011-05-31 ENCOUNTER — Emergency Department (HOSPITAL_COMMUNITY)
Admission: EM | Admit: 2011-05-31 | Discharge: 2011-05-31 | Disposition: A | Discharge status: Home / Self Care | Payer: BC Managed Care – PPO | Attending: Emergency Medicine | Admitting: Emergency Medicine

## 2011-05-31 ENCOUNTER — Encounter (HOSPITAL_COMMUNITY): Payer: Self-pay | Admitting: *Deleted

## 2011-05-31 DIAGNOSIS — Z87891 Personal history of nicotine dependence: Secondary | ICD-10-CM | POA: Insufficient documentation

## 2011-05-31 DIAGNOSIS — D696 Thrombocytopenia, unspecified: Secondary | ICD-10-CM

## 2011-05-31 DIAGNOSIS — R609 Edema, unspecified: Secondary | ICD-10-CM | POA: Insufficient documentation

## 2011-05-31 DIAGNOSIS — I10 Essential (primary) hypertension: Secondary | ICD-10-CM | POA: Insufficient documentation

## 2011-05-31 DIAGNOSIS — R600 Localized edema: Secondary | ICD-10-CM

## 2011-05-31 LAB — BASIC METABOLIC PANEL
Chloride: 92 mEq/L — ABNORMAL LOW (ref 96–112)
GFR calc Af Amer: 57 mL/min — ABNORMAL LOW (ref >60)
GFR calc non Af Amer: 47 mL/min — ABNORMAL LOW (ref >60)
Glucose, Bld: 253 mg/dL — ABNORMAL HIGH (ref 70–99)
Potassium: 3.4 mEq/L — ABNORMAL LOW (ref 3.5–5.1)
Sodium: 133 mEq/L — ABNORMAL LOW (ref 135–145)

## 2011-05-31 LAB — DIFFERENTIAL
Basophils Absolute: 0 10*3/uL (ref 0.0–0.1)
Basophils Relative: 0 % (ref 0–1)
Eosinophils Relative: 0 % (ref 0–5)
Lymphocytes Relative: 3 % — ABNORMAL LOW (ref 12–46)
Neutro Abs: 14.5 10*3/uL — ABNORMAL HIGH (ref 1.7–7.7)
Smear Review: DECREASED

## 2011-05-31 LAB — POCT PREGNANCY, URINE: Preg Test, Ur: NEGATIVE

## 2011-05-31 LAB — URINE MICROSCOPIC-ADD ON

## 2011-05-31 LAB — URINALYSIS, ROUTINE W REFLEX MICROSCOPIC
Bilirubin Urine: NEGATIVE
Nitrite: NEGATIVE
Protein, ur: NEGATIVE mg/dL
Specific Gravity, Urine: 1.01 (ref 1.005–1.030)
Urobilinogen, UA: 0.2 mg/dL (ref 0.0–1.0)

## 2011-05-31 LAB — CBC
MCHC: 31.8 g/dL (ref 30.0–36.0)
MCV: 81.5 fL (ref 78.0–100.0)
Platelets: 36 10*3/uL — ABNORMAL LOW (ref 150–400)
RDW: 16.5 % — ABNORMAL HIGH (ref 11.5–15.5)
WBC: 15.3 10*3/uL — ABNORMAL HIGH (ref 4.0–10.5)

## 2011-05-31 MED ORDER — OXYCODONE-ACETAMINOPHEN 5-325 MG PO TABS
ORAL_TABLET | ORAL | Status: AC
  Filled 2011-05-31: qty 1

## 2011-05-31 MED ORDER — OXYCODONE-ACETAMINOPHEN 5-325 MG PO TABS: ORAL_TABLET | ORAL | Status: DC

## 2011-05-31 MED ORDER — OXYCODONE-ACETAMINOPHEN 5-325 MG PO TABS
2.0000 | ORAL_TABLET | Freq: Once | ORAL | Status: AC
  Administered 2011-05-31: 2 via ORAL
  Filled 2011-05-31: qty 1

## 2011-05-31 NOTE — ED Notes (Signed)
Pt states that she has had fluid build up in her legs since last night. Pt states that her teeth were bleeding this am but has stopped. Pt has ITP.

## 2011-05-31 NOTE — ED Notes (Signed)
Patient with no complaints at this time. Respirations even and unlabored. Skin warm/dry. Discharge instructions reviewed with patient at this time. Patient given opportunity to voice concerns/ask questions. Patient discharged at this time and left Emergency Department with steady gait.   

## 2011-05-31 NOTE — ED Provider Notes (Signed)
History     CSN: [id number] Arrival date & time: 05/31/2011 12:24 PM  Chief Complaint  Patient presents with  . Leg Swelling   HPI Pt was seen at 1340.  Per pt, c/o gradual onset and persistence of constant acute flair of her chronic bilat LE's "swelling" since yesterday.  States her legs swelling began after she was told by her MD to start to take an extra/higher daily steroid dose the day before because "my plts were low."  States she is still concerned regarding her plts count because her "gums bled a little bit" this morning.  Pt also states she hit her bilat lower legs "against something hard" and noticed bilat LE's bruising.  Denies epistaxis, no fevers, no abd pain, no back pain, no N/V/D, no CP/SOB, no rash.  Pt has previous hx of same and has been eval in ED multiple times for same.         Heme/Onc MD:  Pieter Partridge PMD:  Eddie Candle, MD Past Medical History  Diagnosis Date  . Hypertension   . ITP (idiopathic thrombocytopenic purpura)   . Platelets decreased   . Depression   . Thrush, oral 05/29/2011    Past Surgical History  Procedure Date  . Cholecystectomy   . Bone marrow biopsy   . Splenectomy, total     Family History  Problem Relation Age of Onset  . Cancer Mother   . Cervical cancer Mother   . Cancer Father   . Lung cancer Father   . Pneumonia Brother     History  Substance Use Topics  . Smoking status: Former Research scientist (life sciences)  . Smokeless tobacco: Never Used  . Alcohol Use: No    Review of Systems ROS: Statement: All systems negative except as marked or noted in the HPI; Constitutional: Negative for fever and chills. ; ; Eyes: Negative for eye pain, redness and discharge. ; ; ENMT: Negative for ear pain, hoarseness, nasal congestion, sinus pressure and sore throat. ; ; Cardiovascular: Negative for chest pain, palpitations, diaphoresis, dyspnea and peripheral edema. ; ; Respiratory: Negative for cough, wheezing and stridor. ; ; Gastrointestinal: Negative for  nausea, vomiting, diarrhea and abdominal pain, blood in stool, hematemesis, jaundice and rectal bleeding. . ; ; Genitourinary: Negative for dysuria, flank pain and hematuria. ; ; Musculoskeletal: Negative for back pain and neck pain. Negative for trauma. +LE's edema; ; Skin: Negative for pruritus, rash, abrasions, blisters, skin lesion.; ; Neuro: Negative for headache, lightheadedness and neck stiffness. Negative for weakness, altered level of consciousness , altered mental status, extremity weakness, paresthesias, involuntary movement, seizure and syncope.      Physical Exam  BP 144/110  Pulse 122  Temp(Src) 98.3 F (36.8 C) (Oral)  Resp 18  Ht 5\' 5"  (1.651 m)  Wt 188 lb 9.6 oz (85.548 kg)  BMI 31.38 kg/m2  SpO2 99%  LMP 05/04/2011  Physical Exam 1345: Physical examination:  Nursing notes reviewed; Vital signs and O2 SAT reviewed;  Constitutional: Well developed, Well nourished, Well hydrated, In no acute distress; Head:  Normocephalic, atraumatic; Eyes: EOMI, PERRL, No scleral icterus; ENMT: Mouth and pharynx normal without signs of active bleeding, no dried blood in mouth or nares, Mucous membranes moist; Neck: Supple, Full range of motion, No lymphadenopathy; Cardiovascular: Regular rate and rhythm, No murmur, rub, or gallop; Respiratory: Breath sounds clear & equal bilaterally, No rales, rhonchi, wheezes, or rub, Normal respiratory effort/excursion; Chest: Nontender, Movement normal; Abdomen: Soft, Nontender, Nondistended, Normal bowel sounds; Genitourinary: No CVA  tenderness; Extremities: Pulses normal, No tenderness, 1+ pedal edema bilat, No calf edema or asymmetry.; Neuro: AA&Ox3, Major CN grossly intact.  No gross focal motor or sensory deficits in extremities.; Skin: Color normal, Warm, Dry, +2 small areas of ecchymosis bilat lower anterior tibias, no rash, no petechiae.    ED Course  Procedures  MDM MDM Reviewed: previous chart, nursing note and vitals Reviewed previous:  labs Interpretation: labs     Per previous records, baseline plt count approx 20.   Results for orders placed during the hospital encounter of [id number]  BASIC METABOLIC PANEL      Component Value Range   Sodium 133 (*) 135 - 145 (mEq/L)   Potassium 3.4 (*) 3.5 - 5.1 (mEq/L)   Chloride 92 (*) 96 - 112 (mEq/L)   CO2 31  19 - 32 (mEq/L)   Glucose, Bld 253 (*) 70 - 99 (mg/dL)   BUN 23  6 - 23 (mg/dL)   Creatinine, Ser 1.23 (*) 0.50 - 1.10 (mg/dL)   Calcium 9.4  8.4 - 10.5 (mg/dL)   GFR calc non Af Amer 47 (*) >60 (mL/min)   GFR calc Af Amer 57 (*) >60 (mL/min)  CBC      Component Value Range   WBC 15.3 (*) 4.0 - 10.5 (K/uL)   RBC 5.29 (*) 3.87 - 5.11 (MIL/uL)   Hemoglobin 13.7  12.0 - 15.0 (g/dL)   HCT 43.1  36.0 - 46.0 (%)   MCV 81.5  78.0 - 100.0 (fL)   MCH 25.9 (*) 26.0 - 34.0 (pg)   MCHC 31.8  30.0 - 36.0 (g/dL)   RDW 16.5 (*) 11.5 - 15.5 (%)   Platelets 36 (*) 150 - 400 (K/uL)  DIFFERENTIAL      Component Value Range   Neutrophils Relative 95 (*) 43 - 77 (%)   Neutro Abs 14.5 (*) 1.7 - 7.7 (K/uL)   Lymphocytes Relative 3 (*) 12 - 46 (%)   Lymphs Abs 0.5 (*) 0.7 - 4.0 (K/uL)   Monocytes Relative 2 (*) 3 - 12 (%)   Monocytes Absolute 0.3  0.1 - 1.0 (K/uL)   Eosinophils Relative 0  0 - 5 (%)   Eosinophils Absolute 0.0  0.0 - 0.7 (K/uL)   Basophils Relative 0  0 - 1 (%)   Basophils Absolute 0.0  0.0 - 0.1 (K/uL)   Smear Review PLATELETS APPEAR DECREASED    URINALYSIS, ROUTINE W REFLEX MICROSCOPIC      Component Value Range   Color, Urine YELLOW  YELLOW    Appearance CLEAR  CLEAR    Specific Gravity, Urine 1.010  1.005 - 1.030    pH 7.0  5.0 - 8.0    Glucose, UA >1000 (*) NEGATIVE (mg/dL)   Hgb urine dipstick SMALL (*) NEGATIVE    Bilirubin Urine NEGATIVE  NEGATIVE    Ketones, ur NEGATIVE  NEGATIVE (mg/dL)   Protein, ur NEGATIVE  NEGATIVE (mg/dL)   Urobilinogen, UA 0.2  0.0 - 1.0 (mg/dL)   Nitrite NEGATIVE  NEGATIVE    Leukocytes, UA NEGATIVE  NEGATIVE   POCT  PREGNANCY, URINE      Component Value Range   Preg Test, Ur NEGATIVE    URINE MICROSCOPIC-ADD ON      Component Value Range   Squamous Epithelial / LPF MANY (*) RARE    WBC, UA 3-6  <3 (WBC/hpf)   RBC / HPF 3-6  <3 (RBC/hpf)   Bacteria, UA RARE  RARE     Results  for LEVERN, [place] (MRN [id number]) as of 05/31/2011 17:04  Ref. Range 05/29/2011 09:20 05/31/2011 14:30  WBC Latest Range: 4.0-10.5 K/uL 16.9 (H) 15.3 (H)  RBC Latest Range: 3.87-5.11 MIL/uL 5.24 (H) 5.29 (H)  HGB Latest Range: 12.0-15.0 g/dL 13.8 13.7  HCT Latest Range: 36.0-46.0 % 42.6 43.1  MCV Latest Range: 78.0-100.0 fL 81.3 81.5  MCH Latest Range: 26.0-34.0 pg 26.3 25.9 (L)  MCHC Latest Range: 30.0-36.0 g/dL 32.4 31.8  RDW Latest Range: 11.5-15.5 % 16.3 (H) 16.5 (H)  Platelets Latest Range: 150-400 K/uL 22 (LL) 36 (L)   5:03 PM:  No bleeding while in ED.  UA contaminated, UC pending.  Afebrile.  WBC, H/H, plts per baseline.  No hx hemophilia.  Cr mildly elevated from baseline.  Given percocet here for pain, states she "needs a rx for some pain meds."  Pt encouraged to elevate her legs, states she is doing to now.  Pt is currently sitting up, watching TV with pillow propped under her knees with LE's dangling down.  Instructed re: proper positioning for elevating her legs.  Verb understanding, though still requesting "a pain meds rx" for "something stronger than tylenol."  Pt wants to go home now and family wants to take pt home now.  Dx testing d/w pt and family.  Questions answered.  Verb understanding, agreeable to d/c home with outpt f/u.         Medications given in ED:  oxyCODONE-acetaminophen (PERCOCET) 5-325 MG per tablet 2 tablet (2 tablet Oral Given 05/31/11 1458)   New Prescriptions   OXYCODONE-ACETAMINOPHEN (PERCOCET) 5-325 MG PER TABLET    1 or 2 tabs PO q6h prn pain    [place], DO 06/03/11 1324

## 2011-05-31 NOTE — ED Notes (Signed)
Only one percocet removed initally

## 2011-05-31 NOTE — ED Notes (Signed)
[place], MD at bedside.

## 2011-05-31 NOTE — ED Notes (Signed)
Patient requesting pain medication and "something for my nerves" Dr. Elise Benne made aware. Awaiting orders.

## 2011-05-31 NOTE — ED Notes (Signed)
Received critical platelet level of 36,000. Reported to Jason Nest RN

## 2011-06-02 LAB — URINE CULTURE: Culture: NO GROWTH

## 2011-06-03 ENCOUNTER — Other Ambulatory Visit (HOSPITAL_COMMUNITY): Payer: Self-pay | Admitting: *Deleted

## 2011-06-03 DIAGNOSIS — D693 Immune thrombocytopenic purpura: Secondary | ICD-10-CM

## 2011-06-04 ENCOUNTER — Inpatient Hospital Stay (HOSPITAL_COMMUNITY)
Admission: AD | Admit: 2011-06-04 | Discharge: 2011-06-05 | Disposition: A | Discharge status: Other Acute Inpatient Hospital | Payer: BC Managed Care – PPO | Source: Ambulatory Visit | Attending: Internal Medicine | Admitting: Internal Medicine

## 2011-06-04 ENCOUNTER — Encounter (HOSPITAL_COMMUNITY): Payer: Self-pay | Admitting: Oncology

## 2011-06-04 ENCOUNTER — Inpatient Hospital Stay (HOSPITAL_COMMUNITY): Payer: BC Managed Care – PPO

## 2011-06-04 ENCOUNTER — Encounter (HOSPITAL_BASED_OUTPATIENT_CLINIC_OR_DEPARTMENT_OTHER): Payer: BC Managed Care – PPO | Admitting: Oncology

## 2011-06-04 ENCOUNTER — Encounter (HOSPITAL_COMMUNITY): Payer: Self-pay | Admitting: *Deleted

## 2011-06-04 ENCOUNTER — Encounter (HOSPITAL_COMMUNITY): Payer: BC Managed Care – PPO | Attending: Oncology

## 2011-06-04 ENCOUNTER — Inpatient Hospital Stay (HOSPITAL_COMMUNITY): Admission: AD | Admit: 2011-06-04 | Payer: Self-pay | Source: Ambulatory Visit | Admitting: Internal Medicine

## 2011-06-04 ENCOUNTER — Ambulatory Visit (HOSPITAL_COMMUNITY): Admission: RE | Admit: 2011-06-04 | Payer: BC Managed Care – PPO | Source: Ambulatory Visit

## 2011-06-04 VITALS — BP 124/87 | HR 137 | Temp 101.1°F | Wt 193.2 lb

## 2011-06-04 DIAGNOSIS — Z794 Long term (current) use of insulin: Secondary | ICD-10-CM

## 2011-06-04 DIAGNOSIS — R0902 Hypoxemia: Secondary | ICD-10-CM | POA: Diagnosis present

## 2011-06-04 DIAGNOSIS — J189 Pneumonia, unspecified organism: Secondary | ICD-10-CM | POA: Diagnosis present

## 2011-06-04 DIAGNOSIS — Z9081 Acquired absence of spleen: Secondary | ICD-10-CM

## 2011-06-04 DIAGNOSIS — D693 Immune thrombocytopenic purpura: Secondary | ICD-10-CM | POA: Diagnosis present

## 2011-06-04 DIAGNOSIS — J029 Acute pharyngitis, unspecified: Secondary | ICD-10-CM

## 2011-06-04 DIAGNOSIS — E669 Obesity, unspecified: Secondary | ICD-10-CM | POA: Diagnosis present

## 2011-06-04 DIAGNOSIS — R509 Fever, unspecified: Secondary | ICD-10-CM | POA: Diagnosis present

## 2011-06-04 DIAGNOSIS — K122 Cellulitis and abscess of mouth: Secondary | ICD-10-CM | POA: Diagnosis present

## 2011-06-04 DIAGNOSIS — A419 Sepsis, unspecified organism: Secondary | ICD-10-CM | POA: Diagnosis present

## 2011-06-04 DIAGNOSIS — M79609 Pain in unspecified limb: Secondary | ICD-10-CM | POA: Diagnosis present

## 2011-06-04 DIAGNOSIS — F32A Depression, unspecified: Secondary | ICD-10-CM | POA: Diagnosis present

## 2011-06-04 DIAGNOSIS — J9601 Acute respiratory failure with hypoxia: Secondary | ICD-10-CM | POA: Diagnosis not present

## 2011-06-04 DIAGNOSIS — E876 Hypokalemia: Secondary | ICD-10-CM | POA: Diagnosis present

## 2011-06-04 DIAGNOSIS — Z801 Family history of malignant neoplasm of trachea, bronchus and lung: Secondary | ICD-10-CM

## 2011-06-04 DIAGNOSIS — J3489 Other specified disorders of nose and nasal sinuses: Secondary | ICD-10-CM | POA: Diagnosis present

## 2011-06-04 DIAGNOSIS — Z9089 Acquired absence of other organs: Secondary | ICD-10-CM

## 2011-06-04 DIAGNOSIS — IMO0002 Reserved for concepts with insufficient information to code with codable children: Secondary | ICD-10-CM

## 2011-06-04 DIAGNOSIS — K047 Periapical abscess without sinus: Secondary | ICD-10-CM | POA: Insufficient documentation

## 2011-06-04 DIAGNOSIS — H9209 Otalgia, unspecified ear: Secondary | ICD-10-CM | POA: Diagnosis present

## 2011-06-04 DIAGNOSIS — H6690 Otitis media, unspecified, unspecified ear: Secondary | ICD-10-CM | POA: Diagnosis present

## 2011-06-04 DIAGNOSIS — K625 Hemorrhage of anus and rectum: Secondary | ICD-10-CM

## 2011-06-04 DIAGNOSIS — B370 Candidal stomatitis: Secondary | ICD-10-CM

## 2011-06-04 DIAGNOSIS — T380X5A Adverse effect of glucocorticoids and synthetic analogues, initial encounter: Secondary | ICD-10-CM | POA: Diagnosis present

## 2011-06-04 DIAGNOSIS — D849 Immunodeficiency, unspecified: Secondary | ICD-10-CM | POA: Diagnosis present

## 2011-06-04 DIAGNOSIS — I10 Essential (primary) hypertension: Secondary | ICD-10-CM | POA: Diagnosis present

## 2011-06-04 DIAGNOSIS — R7309 Other abnormal glucose: Secondary | ICD-10-CM | POA: Insufficient documentation

## 2011-06-04 DIAGNOSIS — K089 Disorder of teeth and supporting structures, unspecified: Secondary | ICD-10-CM

## 2011-06-04 DIAGNOSIS — F329 Major depressive disorder, single episode, unspecified: Secondary | ICD-10-CM | POA: Diagnosis present

## 2011-06-04 DIAGNOSIS — J019 Acute sinusitis, unspecified: Secondary | ICD-10-CM | POA: Diagnosis present

## 2011-06-04 DIAGNOSIS — K649 Unspecified hemorrhoids: Secondary | ICD-10-CM | POA: Diagnosis present

## 2011-06-04 DIAGNOSIS — R059 Cough, unspecified: Secondary | ICD-10-CM | POA: Insufficient documentation

## 2011-06-04 DIAGNOSIS — G4700 Insomnia, unspecified: Secondary | ICD-10-CM

## 2011-06-04 DIAGNOSIS — R05 Cough: Secondary | ICD-10-CM | POA: Insufficient documentation

## 2011-06-04 DIAGNOSIS — L709 Acne, unspecified: Secondary | ICD-10-CM | POA: Diagnosis present

## 2011-06-04 DIAGNOSIS — Z800 Family history of malignant neoplasm of digestive organs: Secondary | ICD-10-CM

## 2011-06-04 DIAGNOSIS — E099 Drug or chemical induced diabetes mellitus without complications: Secondary | ICD-10-CM | POA: Diagnosis present

## 2011-06-04 DIAGNOSIS — Z803 Family history of malignant neoplasm of breast: Secondary | ICD-10-CM

## 2011-06-04 DIAGNOSIS — K120 Recurrent oral aphthae: Secondary | ICD-10-CM | POA: Diagnosis present

## 2011-06-04 DIAGNOSIS — F3289 Other specified depressive episodes: Secondary | ICD-10-CM | POA: Diagnosis present

## 2011-06-04 DIAGNOSIS — R000 Tachycardia, unspecified: Secondary | ICD-10-CM | POA: Diagnosis present

## 2011-06-04 DIAGNOSIS — Z87891 Personal history of nicotine dependence: Secondary | ICD-10-CM

## 2011-06-04 DIAGNOSIS — J984 Other disorders of lung: Principal | ICD-10-CM | POA: Diagnosis present

## 2011-06-04 DIAGNOSIS — E119 Type 2 diabetes mellitus without complications: Secondary | ICD-10-CM | POA: Diagnosis present

## 2011-06-04 LAB — CBC
MCH: 25.9 pg — ABNORMAL LOW (ref 26.0–34.0)
MCHC: 31.7 g/dL (ref 30.0–36.0)
MCV: 81.7 fL (ref 78.0–100.0)
Platelets: <5 10*3/uL — CL (ref 150–400)
RDW: 17.3 % — ABNORMAL HIGH (ref 11.5–15.5)

## 2011-06-04 LAB — DIFFERENTIAL
Basophils Absolute: 0 10*3/uL (ref 0.0–0.1)
Eosinophils Relative: 0 % (ref 0–5)
Lymphocytes Relative: 12 % (ref 12–46)
Lymphs Abs: 1.1 10*3/uL (ref 0.7–4.0)
Monocytes Absolute: 0.3 10*3/uL (ref 0.1–1.0)
Neutro Abs: 7.8 10*3/uL — ABNORMAL HIGH (ref 1.7–7.7)
Smear Review: DECREASED

## 2011-06-04 LAB — URINE MICROSCOPIC-ADD ON

## 2011-06-04 LAB — COMPREHENSIVE METABOLIC PANEL
ALT: 85 U/L — ABNORMAL HIGH (ref 0–35)
AST: 35 U/L (ref 0–37)
Calcium: 8.7 mg/dL (ref 8.4–10.5)
Creatinine, Ser: 1.17 mg/dL — ABNORMAL HIGH (ref 0.50–1.10)
GFR calc non Af Amer: 50 mL/min — ABNORMAL LOW (ref >60)
Sodium: 134 mEq/L — ABNORMAL LOW (ref 135–145)
Total Protein: 5.5 g/dL — ABNORMAL LOW (ref 6.0–8.3)

## 2011-06-04 LAB — URINALYSIS, ROUTINE W REFLEX MICROSCOPIC
Bilirubin Urine: NEGATIVE
Nitrite: NEGATIVE
Specific Gravity, Urine: 1.015 (ref 1.005–1.030)
Urobilinogen, UA: 0.2 mg/dL (ref 0.0–1.0)
pH: 7 (ref 5.0–8.0)

## 2011-06-04 LAB — PROCALCITONIN: Procalcitonin: 0.12 ng/mL

## 2011-06-04 MED ORDER — ALUM & MAG HYDROXIDE-SIMETH 200-200-20 MG/5ML PO SUSP: 30.0000 mL | Freq: Four times a day (QID) | ORAL | Status: DC | PRN

## 2011-06-04 MED ORDER — SENNA 8.6 MG PO TABS: 2.0000 | ORAL_TABLET | Freq: Every day | ORAL | Status: DC | PRN

## 2011-06-04 MED ORDER — MAGIC MOUTHWASH
5.0000 mL | Freq: Four times a day (QID) | ORAL | Status: DC
  Administered 2011-06-04 – 2011-06-05 (×2): 5 mL via ORAL
  Filled 2011-06-04 (×2): qty 5

## 2011-06-04 MED ORDER — OXYMETAZOLINE HCL 0.05 % NA SOLN
2.0000 | Freq: Two times a day (BID) | NASAL | Status: DC
  Administered 2011-06-04: 2 via NASAL
  Filled 2011-06-04: qty 15

## 2011-06-04 MED ORDER — HYDROCORTISONE 2.5 % RE CREA
TOPICAL_CREAM | Freq: Two times a day (BID) | RECTAL | Status: DC
  Filled 2011-06-04: qty 28.35

## 2011-06-04 MED ORDER — SODIUM CHLORIDE 0.9 % IJ SOLN
INTRAMUSCULAR | Status: AC
  Administered 2011-06-04: 15:00:00
  Filled 2011-06-04: qty 10

## 2011-06-04 MED ORDER — ONDANSETRON HCL 4 MG PO TABS: 4.0000 mg | ORAL_TABLET | Freq: Four times a day (QID) | ORAL | Status: DC | PRN

## 2011-06-04 MED ORDER — SODIUM CHLORIDE 0.9 % IJ SOLN
INTRAMUSCULAR | Status: AC
  Administered 2011-06-04: 16:00:00
  Filled 2011-06-04: qty 10

## 2011-06-04 MED ORDER — PIPERACILLIN-TAZOBACTAM 3.375 G IVPB
3.3750 g | Freq: Three times a day (TID) | INTRAVENOUS | Status: DC
  Administered 2011-06-05 (×2): 3.375 g via INTRAVENOUS
  Filled 2011-06-04 (×2): qty 50

## 2011-06-04 MED ORDER — SODIUM CHLORIDE 0.9 % IV SOLN: INTRAVENOUS | Status: DC

## 2011-06-04 MED ORDER — ESCITALOPRAM OXALATE 10 MG PO TABS
20.0000 mg | ORAL_TABLET | Freq: Every day | ORAL | Status: DC
  Administered 2011-06-05: 20 mg via ORAL
  Filled 2011-06-04 (×2): qty 2

## 2011-06-04 MED ORDER — DOCUSATE SODIUM 100 MG PO CAPS
100.0000 mg | ORAL_CAPSULE | Freq: Two times a day (BID) | ORAL | Status: DC
  Administered 2011-06-04 – 2011-06-05 (×2): 100 mg via ORAL
  Filled 2011-06-04 (×3): qty 1

## 2011-06-04 MED ORDER — TRAZODONE HCL 50 MG PO TABS: 50.0000 mg | ORAL_TABLET | Freq: Every evening | ORAL | Status: DC | PRN

## 2011-06-04 MED ORDER — SODIUM CHLORIDE 0.9 % IV BOLUS (SEPSIS)
2000.0000 mL | Freq: Once | INTRAVENOUS | Status: AC
  Administered 2011-06-04: 2000 mL via INTRAVENOUS

## 2011-06-04 MED ORDER — POTASSIUM CHLORIDE CRYS ER 20 MEQ PO TBCR
20.0000 meq | EXTENDED_RELEASE_TABLET | Freq: Two times a day (BID) | ORAL | Status: DC
  Administered 2011-06-04 – 2011-06-05 (×2): 20 meq via ORAL
  Filled 2011-06-04 (×2): qty 1

## 2011-06-04 MED ORDER — SODIUM CHLORIDE 0.9 % IV BOLUS (SEPSIS)
500.0000 mL | Freq: Once | INTRAVENOUS | Status: AC
  Administered 2011-06-04: 500 mL via INTRAVENOUS

## 2011-06-04 MED ORDER — PIPERACILLIN-TAZOBACTAM 3.375 G IVPB
3.3750 g | Freq: Three times a day (TID) | INTRAVENOUS | Status: DC
  Administered 2011-06-04: 3.375 g via INTRAVENOUS
  Filled 2011-06-04 (×4): qty 50

## 2011-06-04 MED ORDER — ALPRAZOLAM 0.25 MG PO TABS: 0.2500 mg | ORAL_TABLET | Freq: Every evening | ORAL | Status: DC | PRN

## 2011-06-04 MED ORDER — AMOXICILLIN-POT CLAVULANATE 875-125 MG PO TABS: 1.0000 | ORAL_TABLET | Freq: Two times a day (BID) | ORAL | Status: AC

## 2011-06-04 MED ORDER — HYDROCODONE-ACETAMINOPHEN 5-325 MG PO TABS
1.0000 | ORAL_TABLET | ORAL | Status: DC | PRN
  Administered 2011-06-04: 2 via ORAL
  Filled 2011-06-04: qty 2

## 2011-06-04 MED ORDER — SODIUM CHLORIDE 0.9 % IV SOLN
INTRAVENOUS | Status: DC
  Administered 2011-06-04 – 2011-06-05 (×3): via INTRAVENOUS

## 2011-06-04 MED ORDER — PIPERACILLIN-TAZOBACTAM 3.375 G IVPB
3.3750 g | Freq: Once | INTRAVENOUS | Status: AC
  Administered 2011-06-04: 3.375 g via INTRAVENOUS
  Filled 2011-06-04: qty 50

## 2011-06-04 MED ORDER — VANCOMYCIN HCL 1000 MG IV SOLR
1250.0000 mg | INTRAVENOUS | Status: DC
  Administered 2011-06-04: 1250 mg via INTRAVENOUS
  Filled 2011-06-04: qty 1250

## 2011-06-04 MED ORDER — VITAMIN B-12 1000 MCG PO TABS
500.0000 ug | ORAL_TABLET | Freq: Every day | ORAL | Status: DC
  Filled 2011-06-04 (×2): qty 1

## 2011-06-04 MED ORDER — SODIUM CHLORIDE 0.9 % IJ SOLN
INTRAMUSCULAR | Status: AC
  Administered 2011-06-04: 18:00:00
  Filled 2011-06-04: qty 30

## 2011-06-04 MED ORDER — ACETAMINOPHEN 325 MG PO TABS
650.0000 mg | ORAL_TABLET | Freq: Four times a day (QID) | ORAL | Status: DC | PRN
  Administered 2011-06-04 – 2011-06-05 (×2): 650 mg via ORAL
  Filled 2011-06-04 (×2): qty 2

## 2011-06-04 MED ORDER — ONDANSETRON HCL 4 MG/2ML IJ SOLN: 4.0000 mg | Freq: Four times a day (QID) | INTRAMUSCULAR | Status: DC | PRN

## 2011-06-04 MED ORDER — MORPHINE SULFATE 2 MG/ML IJ SOLN
2.0000 mg | INTRAMUSCULAR | Status: DC | PRN
  Administered 2011-06-04 – 2011-06-05 (×3): 2 mg via INTRAVENOUS
  Filled 2011-06-04 (×3): qty 1

## 2011-06-04 MED ORDER — ACETAMINOPHEN 650 MG RE SUPP: 650.0000 mg | Freq: Four times a day (QID) | RECTAL | Status: DC | PRN

## 2011-06-04 NOTE — Progress Notes (Signed)
Labs drawn today for cbc 

## 2011-06-04 NOTE — Progress Notes (Signed)
Patient was still tachycardic after initial fluid bolus. Her Port-A-Cath stone and is not bad but it hurts lactic acid is slightly elevated. I will give her another couple of liters of fluid.

## 2011-06-04 NOTE — Progress Notes (Signed)
Summer Kilts, MD [id number] [place] [address]  1. ITP (idiopathic thrombocytopenic purpura)  CBC, CBC  2. Cough  amoxicillin-clavulanate (AUGMENTIN) 875-125 MG per tablet, DG Chest 2 View, Culture, blood (routine x 2), Urine culture, Urinalysis, Routine w reflex microscopic, 0.9 %  sodium chloride infusion, DG Chest 2 View, Culture, blood (routine x 2), Culture, blood (routine x 2), Urine culture, Urinalysis, Routine w reflex microscopic  3. Nasal discharge  amoxicillin-clavulanate (AUGMENTIN) 875-125 MG per tablet, DG Chest 2 View, Culture, blood (routine x 2), Urine culture, Urinalysis, Routine w reflex microscopic, 0.9 %  sodium chloride infusion, DG Chest 2 View, Culture, blood (routine x 2), Culture, blood (routine x 2), Urine culture, Urinalysis, Routine w reflex microscopic  4. Sore throat  amoxicillin-clavulanate (AUGMENTIN) 875-125 MG per tablet, DG Chest 2 View, Culture, blood (routine x 2), Urine culture, Urinalysis, Routine w reflex microscopic, 0.9 %  sodium chloride infusion, DG Chest 2 View, Culture, blood (routine x 2), Culture, blood (routine x 2), Urine culture, Urinalysis, Routine w reflex microscopic  5. Tooth abscess  amoxicillin-clavulanate (AUGMENTIN) 875-125 MG per tablet  6. Abnormal glucose  Ambulatory referral to Endocrinology  7. Rectal bleeding  Ambulatory referral to Gastroenterology    CURRENT THERAPY:Dexamethasone 40 mg daily for 4 days every 4 weeks finishing this pulse 6 days ago. S/P Splenectomy following high dose steroids, IVIG, and Rituxin which all failed to resolve her    INTERVAL HISTORY: Summer Cook 45 y.o. female returns for  regular  visit for followup of severe, refractory, ITP.  The patient called the clinic yesterday to be seen as a walk-in, as a result, she was seen today.  The patient reports right sided tooth pain.  She explains that this began a few days ago and resembles the feeling of previous  abscesses.  She also reports LE edema, which is improved today compared to yesterday according to the patient.  The patient reports overall generalized joint and muscles "achiness."  This began a few days ago as well.  She says she feels poor.  She reports a cough and sore throat.  Her cough is productive, but she is unaware of the color of the sputum produced because she does not look.    Due to the patient's underlying diagnosis, we obtained a CBC to evaluate her platelet status.  Results of this lab test reveal a platelet count of less than 5000.  She completed her dexamethasone pulse 6 days ago.  During this pulse, her platelets increased appropriately.    The patient also, in passing, reports rectal bleeding on the toilet paper.  She denies any blood in stool.  She admits to black tarry stool, but she reports that she just took pepto-bismol.  Past Medical History  Diagnosis Date  . Hypertension   . ITP (idiopathic thrombocytopenic purpura)   . Platelets decreased   . Depression   . Thrush, oral 05/29/2011    has Hypertension; ITP (idiopathic thrombocytopenic purpura); and Thrush, oral on her problem list.     is allergic to decadron.  Summer Cook had no medications administered during this visit.  Past Surgical History  Procedure Date  . Cholecystectomy   . Bone marrow biopsy   . Splenectomy, total     Denies any double vision, nausea, vomiting, diarrhea, constipation, chest pain, heart palpitations, shortness of breath, blood in stool, black tarry stool, urinary pain, urinary burning, urinary frequency, hematuria.   PHYSICAL EXAMINATION   Filed  Vitals:   06/04/11 1050  BP: 124/87  Pulse: 137  Temp: 101.1 F (38.4 C)    GENERAL:alert, anxious, cooperative, crying and moon facies and ill apearing SKIN: skin color, texture, turgor are normal HEAD: Normocephalic EYES: no examined EARS: External ears normal OROPHARYNX:palatal petechiae  NECK: trachea midline LYMPH:   not examined BREAST:not examined LUNGS: clear to auscultation and percussion HEART: regular rate & rhythm, no murmurs, no gallops, S1 normal and S2 normal, tachycardic ABDOMEN:abdomen soft and normal bowel sounds BACK: Back symmetric, no curvature., No CVA tenderness EXTREMITIES:less then 2 second capillary refill, no joint deformities, effusion, or inflammation, no cyanosis, positive findings:  edema LE B/L 1+ pitting  NEURO: alert & oriented x 3 with fluent speech, no focal motor/sensory deficits, gait normal    LABORATORY DATA: CBC    Component Value Date/Time   WBC 9.0 06/04/2011 1030   RBC 5.02 06/04/2011 1030   HGB 13.0 06/04/2011 1030   HCT 41.0 06/04/2011 1030   PLT <5* 06/04/2011 1030   MCV 81.7 06/04/2011 1030   MCH 25.9* 06/04/2011 1030   MCHC 31.7 06/04/2011 1030   RDW 17.3* 06/04/2011 1030   LYMPHSABS 1.1 06/04/2011 1100   MONOABS 0.3 06/04/2011 1100   EOSABS 0.0 06/04/2011 1100   BASOSABS 0.0 06/04/2011 1100      Chemistry      Component Value Date/Time   NA 134* 06/04/2011 1100   K 3.4* 06/04/2011 1100   CL 92* 06/04/2011 1100   CO2 30 06/04/2011 1100   BUN 25* 06/04/2011 1100   CREATININE 1.17* 06/04/2011 1100      Component Value Date/Time   CALCIUM 8.7 06/04/2011 1100   ALKPHOS 54 06/04/2011 1100   AST 35 06/04/2011 1100   ALT 85* 06/04/2011 1100   BILITOT 0.4 06/04/2011 1100         ASSESSMENT:  1. Severe, refractory, ITP, S/P Dexamethasone pulse x 4 days every 28 days, finishing 6 days ago. 2. Cough, productive of sputum 3. Sore throat 4. Fever, 101.1 today in the clinic 5. Arthralgias and myalgias 6. Right sided tooth pain 7. LE edema B/L 8. Black tarry stool, S/P pepto-bismol consumption. 9. Rectal blood, likely secondary to hemorrhoids 10. Severe thrombocytopenia secondary to infection and ITP   PLAN:  1.  Admit to hospital due to fever in the setting of splenectomy and thrombocytopenia per recommendation by Summer Cook.  I will present the patient's case to Summer Cook who is  the hematologist/oncologist on-call today so she is aware of the patient's case in the event that there is a call.  If the recommendation is to administer IVIG, we may need to entertain transfer to [facility] in Dr. Jaclyn Prime absence at the [facility]. 2. Do not recommend transfusion of platelets unless active bleeding. 3. Recommend GI consult for evaluation of rectal bleeding 4. Recommend endocrinology consult for glucose control  5. Recommend Dental consultation for tooth abscess. 6. Recommend IVIG 1mg /kg IV to acutely increase platelet count per Summer Cook and Summer Cook   All questions were answered. The patient knows to call the clinic with any problems, questions or concerns. We can certainly see the patient much sooner if necessary.  The patient and plan discussed with G. Kavin Leech, MD and Marcy Panning, MD and they are in agreement with the aforementioned.  I spent 40 minutes counseling the patient face to face. The total time spent in the appointment was 60 minutes. More than 50% of the time  spent with the patient was utilized for counseling.   Charae Depaolis

## 2011-06-04 NOTE — Progress Notes (Signed)
ANTIBIOTIC CONSULT NOTE - INITIAL  Pharmacy Consult for Vancomycin and Zosyn Indication:  Empiric/Fever  No Active Allergies  Patient Measurements: Height: 5\' 5"  (165.1 cm) Weight: 193 lb 11.2 oz (87.862 kg) IBW/kg (Calculated) : 57  Adjusted Body Weight:N/a Vital Signs: Temp: 102.2 F (39 C) (09/06 1600) Temp src: Oral (09/06 1600) BP: 124/87 mmHg (09/06 1050) Pulse Rate: 139  (09/06 1600)     Labs:  Basename 06/04/11 1100 06/04/11 1030  WBC -- 9.0  HGB -- 13.0  PLT -- <5*  LABCREA -- --  CREATININE 1.17* --  CRCLEARANCE -- --      Microbiology: Recent Results (from the past 720 hour(s))  URINE CULTURE     Status: Normal   Collection Time   05/31/11  2:20 PM      Component Value Range Status Comment   Specimen Description URINE, CLEAN CATCH   Final    Special Requests NONE   Final    Setup Time [id number]   Final    Colony Count NO GROWTH   Final    Culture NO GROWTH   Final    Report Status 06/02/2011 FINAL   Final     Medical History: Past Medical History  Diagnosis Date  . Hypertension   . ITP (idiopathic thrombocytopenic purpura)   . Platelets decreased   . Depression   . Thrush, oral 05/29/2011     Assessment: Empiric therapy for fever. Creatinine clearance estimated to be 66 mls per minute.  Goal of Therapy:  Vancomycin trough level 15-20 mcg/ml  Plan:   Vancomyin 1250 mg IV every 24 hours. Zosyn 3.375 grams IV every 8 hours (first dose over 30 minutes to deliver abx fast)  Arna Snipe 06/04/2011,5:24 PM

## 2011-06-04 NOTE — H&P (Addendum)
Hospital Admission Note Date: 06/04/2011  Patient name: Summer Cook Medical record number: [id number] Date of birth: [birth year] Age: 45 y.o. Gender: female PCP: [name], MD  Attending physician: Delfina Redwood  Chief Complaint: Mouth pain  History of Present Illness: Summer Cook is an 44 y.o. female was directly admitted from the [facility] with fever, tachycardia, and a "tooth abscess". Her temperature was 101. Her heart rate was over 130. She also has a history of ITP and her platelet count today is less than 5000. 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 She feels weak when standing. Today, she woke up with right-sided jaw and tooth pain. She also has severe right ear pain and right-sided pain around her frontal sinus. She has a sore throat. She has no cough. She does feel short of breath. She feels slightly nauseated. She has had no diarrhea. She has had no chills or sweats. She frequently feels hot but this is not new for her. (She thinks she may be perimenopausal). She has had no postnasal drip. No rhinorrhea. No sick contacts.  She also has had a flareup of her hemorrhoids and scant bleeding. She has been taking multiple stool softeners daily.  She has tried to schedule multiple appointments with her dentist to have her fractured teeth and multiple caries fixed, but each time, she canceled due to thrombocytopenia.  She has had a splenectomy, prednisone, IVIG, and Rituxan but remains thrombocytopenic. She just completed her first Decadron pulse. She had a second opinion at an outside facility. Apparently, she reports having responded to IVIG well in the past, and hematology oncology plans on potentially giving it began while here. Apparently, her blood sugars have been quite elevated while on steroids.  Past Medical History  Diagnosis Date  . Hypertension   . ITP (idiopathic thrombocytopenic purpura)   . Platelets decreased   .  Depression   . Thrush, oral 05/29/2011   Meds: Prescriptions prior to admission  Medication Sig Dispense Refill  . ALPRAZolam (XANAX) 0.5 MG tablet Take one - two tablets, PO, at HS  45 tablet  0  . amoxicillin-clavulanate (AUGMENTIN) 875-125 MG per tablet Take 1 tablet by mouth 2 (two) times daily.  20 tablet  0  . dexamethasone (DECADRON) 4 MG tablet 5 tablets (20 mg), PO, BID totaling 40 mg daily x 4 days every 4 weeks  40 tablet  2  . Diphenhyd-Hydrocort-Nystatin (FIRST-DUKES MOUTHWASH) SUSP Use as directed 5 mLs in the mouth or throat 4 (four) times daily.  150 mL  1  . Docusate Sodium (COLACE PO) Take by mouth. Taking 6 daily       . escitalopram (LEXAPRO) 20 MG tablet Take 20 mg by mouth daily.        . fluconazole (DIFLUCAN) 100 MG tablet Take 100 mg by mouth daily. Last dosage 06/03/11       . furosemide (LASIX) 20 MG tablet Take 1 tablet (20 mg total) by mouth daily.  10 tablet  0  . naproxen sodium (ALEVE) 220 MG tablet Take 440 mg by mouth every 6 (six) hours as needed. Pain        . naproxen sodium (ANAPROX) 220 MG tablet Take 220 mg by mouth as needed.        . Nutritional Supplements (ESTROVEN PO) Take by mouth daily.        Marland Kitchen omeprazole (PRILOSEC) 20 MG capsule Take 20 mg by mouth as needed.        Marland Kitchen  oxyCODONE-acetaminophen (PERCOCET) 5-325 MG per tablet 1 or 2 tabs PO q6h prn pain  25 tablet  0  . predniSONE (DELTASONE) 20 MG tablet Take 80 mg by mouth daily. Taking 5 mg for nest 3 days/finishes sunday      . predniSONE (DELTASONE) 20 MG tablet Take 20 mg by mouth daily. Takes 80mg  for  3 days; 60mg  for 3 days;,40mg  for 3days;,20mg  for 3 days;, 10mg  for 3 days;,5mg  for 3 days then stop started on 04/06/2011       . spironolactone (ALDACTONE) 25 MG tablet Take 25 mg by mouth daily. Patient has not started this new medication have not picked up from pharmacy yet       . triamterene-hydrochlorothiazide (DYAZIDE) 37.5-25 MG per capsule Take 2 capsules by mouth every morning.       .  vitamin B-12 (CYANOCOBALAMIN) 500 MCG tablet Take 500 mcg by mouth daily.         Allergies: Review of patient's allergies indicates no active allergies. History   Social History  . Marital Status: Divorced    Spouse Name: N/A    Number of Children: N/A  . Years of Education: N/A   Occupational History  . Not on file.   Social History Main Topics  . Smoking status: Former Smoker    Quit date: 02/15/2011  . Smokeless tobacco: Never Used  . Alcohol Use: No  . Drug Use: No  . Sexually Active: No   Other Topics Concern  . Not on file   Social History Narrative  . No narrative on file   Family History  Problem Relation Age of Onset  . Cancer Mother   . Cervical cancer Mother   . Cancer Father   . Lung cancer Father   . Pneumonia Brother    Past Surgical History  Procedure Date  . Cholecystectomy   . Bone marrow biopsy   . Splenectomy, total    Review of Systems: Constitutional: negative, Weight gain, hot flashes Eyes: No drainage no visual changes no redness Ears, nose, mouth, throat, and face: As per history of present illness Respiratory: As per history of present illness Cardiovascular: No chest pains or palpitations Gastrointestinal: No pain. As per history of present illness Genitourinary: No dysuria, frequency, hematuria Integument/breast: Acne on her face and back have been a problem with steroids Hematologic/lymphatic: As above Musculoskeletal: No arthralgias or myalgias Neurological: No history of seizures or stroke Behavioral/Psych: Her depression has been controlled, considering her medical problems. Endocrine: As above. She has no history of diabetes.  Physical Exam: Filed Vitals:   06/04/11 1600  Pulse: 45  Temp: 102.2 F (39 C)  Resp: 29   General:  Uncomfortable HEENT: Moon facies.  Acne. Tenderness bilateral frontal sinuses and maxillary sinuses. Right tympanic membrane with erythema, slight bulging, and pus behind the tympanic membrane.  Left tympanic membrane is erythematous without any bulging or pus. Because membranes. Multiple fractured teeth with caries. No obvious abscess. Neck: Shotty submandibular lymphadenopathy supple no thyromegaly Lungs clear to auscultation bilaterally without wheeze rhonchi or rales Cardiovascular tachycardic regular no murmurs gallops rubs  abdomen is obese soft nontender nondistended GU and rectal were deferred Extremities trace edema Skin is with multiple ecchymoses she also has acne on her back lying neurologic alert and oriented cranial nerves and sensorimotor exam are intact Psychiatric normal affect  Lab results: Basic Metabolic Panel:  Basename 06/04/11 1100  NA 134*  K 3.4*  CL 92*  CO2 30  GLUCOSE 126*  BUN 25*  CREATININE 1.17*  CALCIUM 8.7  MG --  PHOS --   Liver Function Tests:  Basename 06/04/11 1100  AST 35  ALT 85*  ALKPHOS 54  BILITOT 0.4  PROT 5.5*  ALBUMIN 3.1*   No results found for this basename: LIPASE:2,AMYLASE:2 in the last 72 hours No results found for this basename: AMMONIA:2 in the last 72 hours CBC:  Basename 06/04/11 1100 06/04/11 1030  WBC -- 9.0  NEUTROABS 7.8* --  HGB -- 13.0  HCT -- 41.0  MCV -- 81.7  PLT -- <5*   Cardiac Enzymes: No results found for this basename: CKTOTAL:3,CKMB:3,CKMBINDEX:3,TROPONINI:3 in the last 72 hours BNP: No results found for this basename: POCBNP:3 in the last 72 hours D-Dimer: No results found for this basename: DDIMER:2 in the last 72 hours CBG: No results found for this basename: GLUCAP:6 in the last 72 hours Hemoglobin A1C: No results found for this basename: HGBA1C in the last 72 hours Fasting Lipid Panel: No results found for this basename: CHOL,HDL,LDLCALC,TRIG,CHOLHDL,LDLDIRECT in the last 72 hours Thyroid Function Tests: No results found for this basename: TSH,T4TOTAL,FREET4,T3FREE,THYROIDAB in the last 72 hours Anemia Panel: No results found for this basename:  VITAMINB12,FOLATE,FERRITIN,TIBC,IRON,RETICCTPCT in the last 72 hours  Urinalysis: Negative  Imaging results:  No results found.  Assessment & Plan: Principal Problem:  *Fever Active Problems:  ITP (idiopathic thrombocytopenic purpura)  Hypertension  Tachycardia  Otitis media, right  Acute sinusitis  Hypokalemia  Aphthous ulcer  Hemorrhoids  Drug-induced hyperglycemia  Acne  Post-splenectomy  Immunosuppressed status  Depression  Obesity  multiple caries, Poor dentition  Patient has been admitted to step down. Certainly, I am concerned about sepsis, though she has no leukocytosis or hypotension. She will get a fluid bolus. I will check a portable chest x-ray to look for pneumonia. Blood cultures. Pro calcitonin. Lactate. Urinalysis is negative. Her fever may be from the otitis media and sinusitis. She will get empiric Zosyn and vancomycin for now. She is immunosuppressed and will need to be monitored closely. If her pro calcitonin and lactate are high, bolus a few more liters of fluid. Since her blood pressure is okay now and she is ambulating fine to the bathroom etc, I will start with 500 cc bolus of saline and then 200 and hour. Her tachycardia may be related to fever and pain. If her tachycardia is  refractory to the initial bolus, will be more aggressive with fluid resuscitation. I discussed the case with the [facility] physician on duty, Dr. Merton Border.   She can also have pain medication and Afrin for the sinusitis and otitis media. I don't see any sign of tooth abscess today but she will need a Panorex and further dental followup as an outpatient. No dentist on staff at [facility].  ITP, refractory to splenectomy, steroids, et Ronney Asters. Per hematology oncology, she is to receive IVIG. Aside from some mild hemorrhoidal bleeding, she has no active bleed. Would not transfuse platelets unless she has evidence of active bleeding which is life-threatening.  No need for GI consult for  the hemorrhoidal bleeding unless she has more vigorous hemorrhage. For now she can have Anusol HC and a bowel regimen.  Reported history of steroid-induced hyperglycemia: She is not currently on steroids. I will check CBGs and a hemoglobin A1c. Her blood glucose today was only 126. No need for endocrinology consult at this time.  Replete potassium. Magic mouthwash. Hold antihypertensives to avoid hypotension in the setting of infection. Continue her antidepressants.  Care time 60 minutes  Edwing Figley L 06/04/2011, 4:15 PM

## 2011-06-05 ENCOUNTER — Inpatient Hospital Stay (HOSPITAL_COMMUNITY)
Admission: AD | Admit: 2011-06-05 | Discharge: 2011-06-08 | DRG: 099 | Disposition: A | Discharge status: Home / Self Care | Payer: BC Managed Care – PPO | Source: Other Acute Inpatient Hospital | Attending: Pulmonary Disease | Admitting: Pulmonary Disease

## 2011-06-05 ENCOUNTER — Inpatient Hospital Stay (HOSPITAL_COMMUNITY): Payer: BC Managed Care – PPO

## 2011-06-05 ENCOUNTER — Other Ambulatory Visit (HOSPITAL_COMMUNITY): Payer: BC Managed Care – PPO

## 2011-06-05 ENCOUNTER — Other Ambulatory Visit: Payer: Self-pay

## 2011-06-05 ENCOUNTER — Encounter (HOSPITAL_COMMUNITY): Payer: BC Managed Care – PPO

## 2011-06-05 DIAGNOSIS — M79609 Pain in unspecified limb: Secondary | ICD-10-CM

## 2011-06-05 DIAGNOSIS — J9601 Acute respiratory failure with hypoxia: Secondary | ICD-10-CM | POA: Diagnosis not present

## 2011-06-05 DIAGNOSIS — J189 Pneumonia, unspecified organism: Secondary | ICD-10-CM

## 2011-06-05 DIAGNOSIS — D693 Immune thrombocytopenic purpura: Secondary | ICD-10-CM

## 2011-06-05 DIAGNOSIS — A419 Sepsis, unspecified organism: Secondary | ICD-10-CM | POA: Diagnosis present

## 2011-06-05 DIAGNOSIS — R0902 Hypoxemia: Secondary | ICD-10-CM

## 2011-06-05 LAB — BASIC METABOLIC PANEL
BUN: 16 mg/dL (ref 6–23)
BUN: 16 mg/dL (ref 6–23)
CO2: 24 mEq/L (ref 19–32)
CO2: 25 mEq/L (ref 19–32)
Chloride: 103 mEq/L (ref 96–112)
Chloride: 105 mEq/L (ref 96–112)
Creatinine, Ser: 1.18 mg/dL — ABNORMAL HIGH (ref 0.50–1.10)
GFR calc Af Amer: >60 mL/min (ref >60)
Glucose, Bld: 91 mg/dL (ref 70–99)
Potassium: 3.6 mEq/L (ref 3.5–5.1)

## 2011-06-05 LAB — GLUCOSE, CAPILLARY
Glucose-Capillary: 143 mg/dL — ABNORMAL HIGH (ref 70–99)
Glucose-Capillary: 182 mg/dL — ABNORMAL HIGH (ref 70–99)
Glucose-Capillary: 152 mg/dL — ABNORMAL HIGH (ref 70–99)

## 2011-06-05 LAB — BLOOD GAS, ARTERIAL
TCO2: 22.4 mmol/L (ref 0–100)
pCO2 arterial: 37.4 mmHg (ref 35.0–45.0)
pH, Arterial: 7.445 — ABNORMAL HIGH (ref 7.350–7.400)
pO2, Arterial: 80.8 mmHg (ref 80.0–100.0)

## 2011-06-05 LAB — URINE MICROSCOPIC-ADD ON

## 2011-06-05 LAB — CBC
HCT: 38.2 % (ref 36.0–46.0)
Hemoglobin: 12.1 g/dL (ref 12.0–15.0)
MCH: 26.1 pg (ref 26.0–34.0)
MCH: 26.5 pg (ref 26.0–34.0)
MCHC: 31.7 g/dL (ref 30.0–36.0)
MCV: 82.5 fL (ref 78.0–100.0)
MCV: 81.3 fL (ref 78.0–100.0)
Platelets: <5 10*3/uL — CL (ref 150–400)
RDW: 17.9 % — ABNORMAL HIGH (ref 11.5–15.5)
WBC: 7.7 10*3/uL (ref 4.0–10.5)

## 2011-06-05 LAB — URINALYSIS, ROUTINE W REFLEX MICROSCOPIC
Bilirubin Urine: NEGATIVE
Glucose, UA: 100 mg/dL — AB
Ketones, ur: NEGATIVE mg/dL
Leukocytes, UA: NEGATIVE
Nitrite: NEGATIVE
Protein, ur: NEGATIVE mg/dL
Specific Gravity, Urine: 1.021 (ref 1.005–1.030)
Urobilinogen, UA: 1 mg/dL (ref 0.0–1.0)
pH: 5.5 (ref 5.0–8.0)

## 2011-06-05 LAB — STREP PNEUMONIAE URINARY ANTIGEN: Strep Pneumo Urinary Antigen: NEGATIVE

## 2011-06-05 LAB — PRO B NATRIURETIC PEPTIDE
Pro B Natriuretic peptide (BNP): 155 pg/mL — ABNORMAL HIGH (ref 0–125)
Pro B Natriuretic peptide (BNP): 261.8 pg/mL — ABNORMAL HIGH (ref 0–125)

## 2011-06-05 LAB — D-DIMER, QUANTITATIVE: D-Dimer, Quant: 1.61 ug/mL-FEU — ABNORMAL HIGH (ref 0.00–0.48)

## 2011-06-05 LAB — APTT: aPTT: 25 seconds (ref 24–37)

## 2011-06-05 MED ORDER — FUROSEMIDE 10 MG/ML IJ SOLN
40.0000 mg | Freq: Once | INTRAMUSCULAR | Status: AC
  Administered 2011-06-05: 40 mg via INTRAVENOUS
  Filled 2011-06-05: qty 4

## 2011-06-05 MED ORDER — METOPROLOL TARTRATE 1 MG/ML IV SOLN
5.0000 mg | INTRAVENOUS | Status: DC | PRN
  Administered 2011-06-05: 5 mg via INTRAVENOUS
  Filled 2011-06-05: qty 5

## 2011-06-05 MED ORDER — ACETAMINOPHEN 650 MG RE SUPP: 650.0000 mg | Freq: Four times a day (QID) | RECTAL | Status: DC | PRN

## 2011-06-05 MED ORDER — DEXTROSE 5 % IV SOLN
500.0000 mg | INTRAVENOUS | Status: DC
  Administered 2011-06-05: 500 mg via INTRAVENOUS
  Filled 2011-06-05: qty 500

## 2011-06-05 MED ORDER — ACETAMINOPHEN 325 MG PO TABS
ORAL_TABLET | ORAL | Status: AC
  Administered 2011-06-05: 650 mg via ORAL
  Filled 2011-06-05: qty 2

## 2011-06-05 MED ORDER — SODIUM CHLORIDE 0.9 % IV BOLUS (SEPSIS)
1000.0000 mL | Freq: Once | INTRAVENOUS | Status: AC
  Administered 2011-06-05: 1000 mL via INTRAVENOUS

## 2011-06-05 MED ORDER — ACETAMINOPHEN 325 MG PO TABS
650.0000 mg | ORAL_TABLET | ORAL | Status: DC | PRN
  Administered 2011-06-05: 650 mg via ORAL

## 2011-06-05 NOTE — Progress Notes (Signed)
Dr. Megan Salon called and stated to change Tylenol order to q 4hrs and give dose now. Also d/c IV fluids

## 2011-06-05 NOTE — Progress Notes (Signed)
[id number] Paged MD about HR (130s-140s), RR (30-40), BP (146/88) all still elevated. Also informed MD about O2 sats decreased, placed on NRB and O2 sats increased to 90-92. MD ordered stat ABG.   0528 Also paged MD about elevated D-Dimer, no new orders received at this time

## 2011-06-05 NOTE — Progress Notes (Signed)
[facility] Progress Note Patient Name: AKYRAH CHRISTEN DOB: 13-Dec-1965 MRN: [id number]  Date of Service  06/05/2011   HPI/Events of Note   Lopressor 5 mg IV q2 hours prn HR greater than 130 as long as SBP greater than 110   eICU Interventions  Lopressor IV prn      Gus Littler 06/05/2011, 6:33 AM

## 2011-06-05 NOTE — Progress Notes (Signed)
Dr Megan Salon on unit to see pt, Pt has a temp of 101.5, has chills, flushed face, and increased HR and RR. New orders received to get another lactic acid and D-Dimer.

## 2011-06-05 NOTE — Progress Notes (Signed)
Patient d/w PCCM physician on call, Dr Deterding re acute resp failure/sepsis;  Advised Anesthesia intubation if intubation considered. BIPAP until then. ABG and CXR still pending.

## 2011-06-05 NOTE — Progress Notes (Signed)
Transferred to [facility], out via stretcher by [facility] in stable condition with oxygen @ 100%NRB mask, IV site Left hand WNL, reported to Lorenza Cambridge, RN 2100 unit at [facility].

## 2011-06-05 NOTE — Progress Notes (Signed)
Paged Dr. Megan Salon about Summer Cook in ICU 11 about HR low 140s.  Orders received to get AM labs now (CBC & BMET), obtain EKG, and give 1L bolus.

## 2011-06-05 NOTE — Progress Notes (Signed)
Overnight, patient became suddenly hypoxic. She was placed on nonrebreather. She reportedly was to With a respiratory rate in the 30s but had no accessory muscle usage. She did not feel short of breath on supplemental oxygen. The on-call hospitalist placed her on BiPAP. She received another liter of saline. The elink physician ordered Lopressor for sinus tachycardia.  Subjective: Patient is thirsty. She denies cough. She denies shortness of breath. She wants the BiPAP off. She's had no bleeding. Her sinus pressure, ear pain, and tooth pain is improved. No bleeding  Objective: Vital signs in last 24 hours: Filed Vitals:   06/05/11 0600 06/05/11 0630 06/05/11 0635 06/05/11 0739  BP:      Pulse: 139 137 136 109  Temp:      TempSrc:      Resp: 40 34 38 27  Height:      Weight:      SpO2: 94% 98% 95% 94%   Weight change:   Intake/Output Summary (Last 24 hours) at 06/05/11 0759 Last data filed at 06/05/11 0500  Gross per 24 hour  Intake   1300 ml  Output   1850 ml  Net   -550 ml   Telemetry: Sinus tachycardia with a rate of 115  Physical examination: General: Patient is asleep. Arousable. Appears ill, but no respiratory distress on BiPAP.  Lungs: Tachypnea. left-sided pleural rub. No wheezes. No rhonchi. Cardiovascular: Fast, regular without murmurs gallops rubs Abdomen soft nontender Extremities legs hands and arms are edematous.  Results: Basic Metabolic Panel:  Lab [id number] 0450 06/04/11 1100  NA 138 134*  K 3.6 3.4*  CL 103 92*  CO2 24 30  GLUCOSE 91 126*  BUN 16 25*  CREATININE 1.11* 1.17*  CALCIUM 7.1* 8.7  MG -- --  PHOS -- --   Liver Function Tests:  Lab 06/04/11 1100  AST 35  ALT 85*  ALKPHOS 54  BILITOT 0.4  PROT 5.5*  ALBUMIN 3.1*   No results found for this basename: LIPASE:2,AMYLASE:2 in the last 168 hours No results found for this basename: AMMONIA:2 in the last 168 hours CBC:  Lab 06/05/11 0450 06/04/11 1100 06/04/11 1030 05/31/11 1430  WBC  9.4 -- 9.0 --  NEUTROABS -- 7.8* -- 14.5*  HGB 12.1 -- 13.0 --  HCT 38.2 -- 41.0 --  MCV 82.5 -- 81.7 --  PLT <5* -- <5* --   Cardiac Enzymes: No results found for this basename: CKTOTAL:3,CKMB:3,CKMBINDEX:3,TROPONINI:3 in the last 168 hours BNP:  Lab 06/05/11 0639  POCBNP 155.0*   D-Dimer:  Lab 06/05/11 0450  DDIMER 1.61*   CBG: No results found for this basename: GLUCAP:6 in the last 168 hours Hemoglobin A1C:  Lab 06/04/11 1559  HGBA1C 7.1*   Fasting Lipid Panel: No results found for this basename: CHOL,HDL,LDLCALC,TRIG,CHOLHDL,LDLDIRECT in the last 168 hours Thyroid Function Tests: No results found for this basename: TSH,T4TOTAL,FREET4,T3FREE,THYROIDAB in the last 168 hours Anemia Panel: No results found for this basename: VITAMINB12,FOLATE,FERRITIN,TIBC,IRON,RETICCTPCT in the last 168 hours   Micro Results: Recent Results (from the past 240 hour(s))  URINE CULTURE     Status: Normal   Collection Time   05/31/11  2:20 PM      Component Value Range Status Comment   Specimen Description URINE, CLEAN CATCH   Final    Special Requests NONE   Final    Setup Time [id number]   Final    Colony Count NO GROWTH   Final    Culture NO GROWTH   Final  Report Status 06/02/2011 FINAL   Final   MRSA PCR SCREENING     Status: Normal   Collection Time   06/04/11  4:31 PM      Component Value Range Status Comment   MRSA by PCR NEGATIVE  NEGATIVE  Final    Studies/Results: Dg Chest Portable 1 View  06/05/2011  *RADIOLOGY REPORT*  Clinical Data: Change in patient condition.  Fever, congestion, decreased O2 sats.  PORTABLE CHEST - 1 VIEW  Comparison: 06/04/2011  Findings: Shallow inspiration.  Borderline heart size and pulmonary vascularity.  There is increasing hazy opacity in the perihilar and basilar regions suggesting developing pneumonitis or edema.  The left hemidiaphragm is obscured suggesting small effusion and basilar atelectasis or infiltration.  Findings are mild but  progressing since the previous study.  IMPRESSION: Suggestion of progressive perihilar edema or pneumonia. Atelectasis or infiltration and effusion in the left lung base.  Original Report Authenticated By: Neale Burly, M.D.   Portable Chest 1 View  06/04/2011  *RADIOLOGY REPORT*  Clinical Data: 45 year old female with fever, lethargy, tachycardia.  PORTABLE CHEST - 1 VIEW  Comparison: 04/30/2011 and earlier.  Findings: Portable AP view at 1734 hours.  Slightly lower lung volumes.  Mild linear increased retrocardiac opacity.  No pneumothorax, pulmonary edema, consolidation, or definite effusion. Cardiac size and mediastinal contours are within normal limits.  IMPRESSION: Increased retrocardiac atelectasis.  No other acute cardiopulmonary abnormality identified.  Original Report Authenticated By: Randall An, M.D.   Scheduled Meds:   . docusate sodium  100 mg Oral BID  . escitalopram  20 mg Oral Daily  . furosemide  40 mg Intravenous Once  . hydrocortisone   Rectal BID  . magic mouthwash  5 mL Oral QID  . oxymetazoline  2 spray Each Nare BID  . piperacillin-tazobactam (ZOSYN)  IV  3.375 g Intravenous Once  . piperacillin-tazobactam (ZOSYN)  IV  3.375 g Intravenous Q8H  . potassium chloride  20 mEq Oral BID  . sodium chloride  1,000 mL Intravenous Once  . sodium chloride  2,000 mL Intravenous Once  . sodium chloride  500 mL Intravenous Once  . sodium chloride      . sodium chloride      . sodium chloride      . vancomycin  1,250 mg Intravenous Q24H  . vitamin B-12  500 mcg Oral Daily  . DISCONTD: piperacillin-tazobactam (ZOSYN)  IV  3.375 g Intravenous Q8H   Continuous Infusions:   . DISCONTD: sodium chloride Stopped (06/05/11 0553)   PRN Meds:.acetaminophen, ALPRAZolam, alum & mag hydroxide-simeth, HYDROcodone-acetaminophen, morphine injection, ondansetron (ZOFRAN) IV, ondansetron, senna, traZODone, DISCONTD: acetaminophen, DISCONTD: acetaminophen, DISCONTD: acetaminophen,  DISCONTD: metoprolol  Assessment/Plan: Principal Problem:  *Sepsis Active Problems:  ITP (idiopathic thrombocytopenic purpura)  Pneumonia  Acute respiratory failure with hypoxia  Hypertension  Tachycardia  Otitis media  Acute sinusitis  Hypokalemia  Aphthous ulcer  Hemorrhoids  Drug-induced hyperglycemia  Acne  Post-splenectomy  Immunosuppressed status  Depression  Obesity   patient's chest x-ray now shows pneumonia. she remains on empiric vancomycin and Zosyn. Will add azithromycin to cover atypicals. Her blood pressure remains normal, but she became hypoxic overnight. Her pro BNP is only 155. She remains tachycardic despite adequate fluid resuscitation.   with respect to her respiratory failure, it sounds as if her hypoxia improved with supplemental oxygen last night, and she had no shortness of breath or labored breathing. I will try her off BiPAP. I think she will do well.  Hematology  is not available this week or next at [facility], and she needs IVIG and hematology consult. I've spoken with Dr. Nadara Mode, and patient has been accepted to the critical care service at [facility] or [facility] We are currently awaiting word on which hospital. The patient is stable for transport.  Totals critical care time 60 minutes.     LOS: 1 day   Uno Esau L 06/05/2011, 7:59 AM

## 2011-06-05 NOTE — Plan of Care (Signed)
Problem: Phase I Progression Outcomes Goal: Voiding-avoid urinary catheter unless indicated Outcome: Not Progressing Foley catheter placed for urinary retention.

## 2011-06-06 DIAGNOSIS — D693 Immune thrombocytopenic purpura: Secondary | ICD-10-CM

## 2011-06-06 LAB — URINE CULTURE: Culture  Setup Time: 201209071505

## 2011-06-06 LAB — DIFFERENTIAL
Basophils Absolute: 0 10*3/uL (ref 0.0–0.1)
Basophils Relative: 0 % (ref 0–1)
Lymphocytes Relative: 6 % — ABNORMAL LOW (ref 12–46)
Neutro Abs: 6.5 10*3/uL (ref 1.7–7.7)

## 2011-06-06 LAB — GLUCOSE, CAPILLARY
Glucose-Capillary: 139 mg/dL — ABNORMAL HIGH (ref 70–99)
Glucose-Capillary: 174 mg/dL — ABNORMAL HIGH (ref 70–99)
Glucose-Capillary: 182 mg/dL — ABNORMAL HIGH (ref 70–99)
Glucose-Capillary: 191 mg/dL — ABNORMAL HIGH (ref 70–99)
Glucose-Capillary: 378 mg/dL — ABNORMAL HIGH (ref 70–99)

## 2011-06-06 LAB — CBC
Hemoglobin: 11.1 g/dL — ABNORMAL LOW (ref 12.0–15.0)
MCHC: 32.8 g/dL (ref 30.0–36.0)
Platelets: 6 10*3/uL — CL (ref 150–400)
RBC: 4.14 MIL/uL (ref 3.87–5.11)

## 2011-06-06 LAB — BASIC METABOLIC PANEL
Chloride: 100 mEq/L (ref 96–112)
GFR calc Af Amer: >60 mL/min (ref >60)
Potassium: 4.1 mEq/L (ref 3.5–5.1)
Sodium: 133 mEq/L — ABNORMAL LOW (ref 135–145)

## 2011-06-06 LAB — MAGNESIUM: Magnesium: 1.9 mg/dL (ref 1.5–2.5)

## 2011-06-06 LAB — PHOSPHORUS: Phosphorus: 2.1 mg/dL — ABNORMAL LOW (ref 2.3–4.6)

## 2011-06-07 ENCOUNTER — Inpatient Hospital Stay (HOSPITAL_COMMUNITY): Payer: BC Managed Care – PPO

## 2011-06-07 DIAGNOSIS — D693 Immune thrombocytopenic purpura: Secondary | ICD-10-CM

## 2011-06-07 DIAGNOSIS — J189 Pneumonia, unspecified organism: Secondary | ICD-10-CM

## 2011-06-07 DIAGNOSIS — R0902 Hypoxemia: Secondary | ICD-10-CM

## 2011-06-07 LAB — GLUCOSE, CAPILLARY
Glucose-Capillary: 272 mg/dL — ABNORMAL HIGH (ref 70–99)
Glucose-Capillary: 184 mg/dL — ABNORMAL HIGH (ref 70–99)

## 2011-06-07 LAB — CBC
HCT: 27.2 % — ABNORMAL LOW (ref 36.0–46.0)
Hemoglobin: 8.5 g/dL — ABNORMAL LOW (ref 12.0–15.0)
MCH: 26 pg (ref 26.0–34.0)
MCHC: 31.3 g/dL (ref 30.0–36.0)
MCV: 83.2 fL (ref 78.0–100.0)
Platelets: 35 10*3/uL — ABNORMAL LOW (ref 150–400)
RBC: 3.27 MIL/uL — ABNORMAL LOW (ref 3.87–5.11)
RDW: 17.2 % — ABNORMAL HIGH (ref 11.5–15.5)
WBC: 6.8 10*3/uL (ref 4.0–10.5)

## 2011-06-07 LAB — BASIC METABOLIC PANEL
BUN: 22 mg/dL (ref 6–23)
CO2: 26 mEq/L (ref 19–32)
Calcium: 7.1 mg/dL — ABNORMAL LOW (ref 8.4–10.5)
Chloride: 109 mEq/L (ref 96–112)
Creatinine, Ser: 0.83 mg/dL (ref 0.50–1.10)
GFR calc Af Amer: >60 mL/min (ref >60)

## 2011-06-07 LAB — LACTATE DEHYDROGENASE: LDH: 439 U/L — ABNORMAL HIGH (ref 94–250)

## 2011-06-07 LAB — LEGIONELLA ANTIGEN, URINE: Legionella Antigen, Urine: NEGATIVE

## 2011-06-07 LAB — DIFFERENTIAL
Basophils Relative: 0 % (ref 0–1)
Eosinophils Absolute: 0 10*3/uL (ref 0.0–0.7)
Monocytes Relative: 4 % (ref 3–12)
Neutro Abs: 6.1 10*3/uL (ref 1.7–7.7)
Neutrophils Relative %: 89 % — ABNORMAL HIGH (ref 43–77)

## 2011-06-08 ENCOUNTER — Inpatient Hospital Stay (HOSPITAL_COMMUNITY): Payer: BC Managed Care – PPO

## 2011-06-08 LAB — BASIC METABOLIC PANEL
Calcium: 8.5 mg/dL (ref 8.4–10.5)
GFR calc Af Amer: >60 mL/min (ref >60)
GFR calc non Af Amer: >60 mL/min (ref >60)
Potassium: 4 mEq/L (ref 3.5–5.1)
Sodium: 132 mEq/L — ABNORMAL LOW (ref 135–145)

## 2011-06-08 LAB — DIFFERENTIAL
Basophils Absolute: 0 10*3/uL (ref 0.0–0.1)
Basophils Relative: 0 % (ref 0–1)
Eosinophils Absolute: 0 10*3/uL (ref 0.0–0.7)
Monocytes Absolute: 0.4 10*3/uL (ref 0.1–1.0)
Monocytes Relative: 4 % (ref 3–12)
Neutro Abs: 8 10*3/uL — ABNORMAL HIGH (ref 1.7–7.7)

## 2011-06-08 LAB — PHOSPHORUS: Phosphorus: 3.6 mg/dL (ref 2.3–4.6)

## 2011-06-08 LAB — CBC
Hemoglobin: 10.1 g/dL — ABNORMAL LOW (ref 12.0–15.0)
MCH: 26.9 pg (ref 26.0–34.0)
MCHC: 31.4 g/dL (ref 30.0–36.0)
Platelets: 94 10*3/uL — ABNORMAL LOW (ref 150–400)
RDW: 17.6 % — ABNORMAL HIGH (ref 11.5–15.5)

## 2011-06-08 LAB — GLUCOSE, CAPILLARY
Glucose-Capillary: 180 mg/dL — ABNORMAL HIGH (ref 70–99)
Glucose-Capillary: 251 mg/dL — ABNORMAL HIGH (ref 70–99)

## 2011-06-08 LAB — MAGNESIUM: Magnesium: 1.9 mg/dL (ref 1.5–2.5)

## 2011-06-09 LAB — CULTURE, BLOOD (ROUTINE X 2): Culture: NO GROWTH

## 2011-06-10 ENCOUNTER — Encounter: Payer: BC Managed Care – PPO | Admitting: Oncology

## 2011-06-10 ENCOUNTER — Other Ambulatory Visit: Payer: Self-pay | Admitting: Oncology

## 2011-06-10 ENCOUNTER — Encounter (HOSPITAL_BASED_OUTPATIENT_CLINIC_OR_DEPARTMENT_OTHER): Payer: BC Managed Care – PPO | Admitting: Oncology

## 2011-06-10 DIAGNOSIS — D693 Immune thrombocytopenic purpura: Secondary | ICD-10-CM

## 2011-06-10 LAB — MANUAL DIFFERENTIAL
ALC: 0.8 10*3/uL — ABNORMAL LOW (ref 0.9–3.3)
ANC (CHCC manual diff): 11.7 10*3/uL — ABNORMAL HIGH (ref 1.5–6.5)
Basophil: 0 % (ref 0–2)
Blasts: 0 % (ref 0–0)
Metamyelocytes: 0 % (ref 0–0)
Myelocytes: 2 % — ABNORMAL HIGH (ref 0–0)
PLT EST: ADEQUATE
Variant Lymph: 0 % (ref 0–0)

## 2011-06-10 LAB — CBC & DIFF AND RETIC
HCT: 36.5 % (ref 34.8–46.6)
HGB: 11.7 g/dL (ref 11.6–15.9)
Retic %: 5.5 % — ABNORMAL HIGH (ref 0.70–2.10)
Retic Ct Abs: 246.95 10*3/uL — ABNORMAL HIGH (ref 33.70–90.70)
WBC: 12.7 10*3/uL — ABNORMAL HIGH (ref 3.9–10.3)

## 2011-06-10 LAB — CHCC SMEAR

## 2011-06-11 ENCOUNTER — Other Ambulatory Visit (HOSPITAL_COMMUNITY): Payer: BC Managed Care – PPO

## 2011-06-11 LAB — CULTURE, BLOOD (ROUTINE X 2)
Culture  Setup Time: 201209072335
Culture: NO GROWTH

## 2011-06-11 NOTE — Consult Note (Signed)
NAMERORIE, YUEN NO.:  [id number]  MEDICAL RECORD NO.:  [id number]  LOCATION:  [id number]                         FACILITY:  [place]  PHYSICIAN:  Eston Esters, M.D., F.R.C.P.C.DATE OF BIRTH:  [birth year]  DATE OF CONSULTATION:  06/05/2011 DATE OF DISCHARGE:  06/08/2011                                CONSULTATION   REASON FOR CONSULTATION:  ITP.  HISTORY OF PRESENT ILLNESS:  Ms. 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 [name] for ITP.  The patient is on steroid treatment as directed by Dr. [name].  She presented to his office on June 04, 2011, at which time her platelets were decreased to 5000 requiring admission to [facility].  These low counts were accompanied by fever of 101, tachycardia with heart rate over 130.  In addition, she had right-sided jaw and tooth pain after possible tooth abscess.  She was weak.  In addition, she has severe ear pain and right- sided frontal sinus pain.  She has some shortness of breath and slight nausea.  No overt bleeding has been seen.  The patient has been treated in the past following splenectomy with prednisone, IVIG, and Rituxan, continuing to be thrombocytopenic.  She has just completed her first Decadron course.  After admission, the patient had an acute episode of hypoxia and tachypnea with desaturations into the 60s, thus being transferred to [facility] for further care and management of her symptoms.  We were asked to see her in consultation to help in the management of her low platelet counts.  PAST MEDICAL HISTORY: 1. History of ITP. 2. Depression. 3. Hypertension. 4. History of acne rosacea. 5. History of thrush.  PAST SURGICAL HISTORY: 1. Status post cholecystectomy. 2. 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  MEDICATIONS:  As per MAR.  On admission, the patient was on  Xanax, Augmentin, Decadron, Colace, Lexapro, Diflucan, Magic Mouth Wash, Lasix, Aleve, Anaprox, Estroven, Prilosec, Percocet, Deltasone, Aldactone, Dyazide, and B12.  Benadryl 25 mg IV and Tylenol 650 mg p.o.  ALLERGIES:  NKDA.  SOCIAL HISTORY:  The patient is a prior smoker, discontinued 2 years ago, at which time she smoked about 10 cigarettes to 1 pack every 2-3 weeks.  She denies any alcohol or illicit drug use.  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  FAMILY HISTORY:  Remarkable for lung cancer in father.  Mother died with cervical cancer.  She has one grandmother who died with both colon and breast cancer.  She has two brothers diseased, one with pneumonia and other one with likely pneumonia.  REVIEW OF SYSTEMS:  See HPI for significant positives.  Rest of the review of systems is negative.  PHYSICAL EXAMINATION:  GENERAL:  This is a 45 year old white female, weak appearing, but currently in no acute distress, alert and oriented x3. VITAL SIGNS:  Blood pressure 99/60, pulse 100, respirations 22, O2 sats 96%, weight 89.8 kg. HEENT:  Remarkable for moon facies.  Sclerae anicteric.  Oral cavity without thrush or lesions. NECK:  Supple.  No cervical or supraclavicular masses. LUNGS:  Decreased breath sounds at the bases.  No rales or wheezing. CARDIOVASCULAR:  Regular rate and rhythm without murmurs, rubs, or gallops. ABDOMEN:  Obese, nontender.  Bowel sounds x4. EXTREMITIES:  Without edema.  LABORATORY DATA:  Legionella negative.  Urine culture negative.  Strep pneumonia negative.  Urinalysis with 100 of glucose and large blood. White count 7.7, hemoglobin 12.9, hematocrit 39.5, platelets less than 5.  PTT 25, PT 13, INR 0.96.  MRSA negative.  BNP 155.  Sodium 138, potassium 3.6, BUN 16, creatinine 1.11, glucose 91, calcium 7.1, D-dimer 1.61.  Pro-calcitonin 0.12, total bilirubin 0.4, alkaline phosphatase 54, AST 35,  ALT 85, total protein 5.5, albumin 3.1.  RADIOLOGICAL STUDIES:  Status post chest x-ray on June 05, 2011, showing progressive perihilar edema versus pneumonia.  Possible effusion on the left lung base.  ASSESSMENT/PLAN:  Dr. Truddie Coco has seen and evaluated the patient.  This is a 45 year old woman with a history of ITP diagnosed in December 2011, status post recent splenectomy on Feb 21, 2011, with no improvement of platelet count.  Most recently, the count was 4000 in [facility], was admitted for IVIG and developed cough, shortness of breath, and was transferred to [facility] for respiratory support.  CBC shows platelets to be low at less than 5000.  Thus, she is at risk for bleeding.  We will begin IVIG tonight at 1 g/kg, IV dose per pharmacy for 3 days.  Check daily a.m. CBC with manual differential.  We will continue to monitor the counts, and further recommendations are to proceed.  Thank you very much for allowing [place] the opportunity to participate in the care of Ms. Pick.     Sharene Butters, PA-C   ______________________________ Eston Esters, M.D., F.R.C.P.C.    SW/MEDQ  D:  06/08/2011  T:  06/08/2011  Job:  [id number] Electronically Signed by Sharene Butters P.A. on 06/10/2011 03:15:50 PM Electronically Signed by Eston Esters MD on 06/11/2011 12:41:08 PM

## 2011-06-15 NOTE — Discharge Summary (Addendum)
Summer Cook, Summer Cook               ACCOUNT NO.:  [id number]  MEDICAL RECORD NO.:  [id number]  LOCATION:  [id number]                         FACILITY:  [place]  PHYSICIAN:  Providence Lanius, MD  DATE OF BIRTH:  1965/12/06  DATE OF ADMISSION:  06/05/2011 DATE OF DISCHARGE:  06/08/2011                              DISCHARGE SUMMARY   DISCHARGE DIAGNOSES: 1. Acute respiratory distress/hypoxemia. 2. Idiopathic thrombocytopenic purpura. 3. Lower extremity pain. 4. Hyperglycemia. 5. Hypophosphatemia/hypokalemia. 6. Questionable right lower molar abscess.  LINES/TUBES:  On June 05, 2011, right upper extremity PICC.  CULTURE DATA:  On June 05, 2011, blood cultures x2 demonstrate no growth on preliminary results, final results pending.  On June 05, 2011, urine culture demonstrates no growth.  On June 04, 2011, blood cultures x2 demonstrate no growth on preliminary results, final results pending.  On June 05, 2011, urine Legionella demonstrates no growth.  ANTIBIOTIC DATA:  The patient was placed on vancomycin and Zosyn on June 05, 2011 and continued on this until June 07, 2011, at which time, she was changed to Augmentin and she will complete five more days of Augmentin.  BEST PRACTICE:  The patient was placed on SCDs for DVT prophylaxis after lower extremity Dopplers were clear for DVT.  KEY EVENTS/STUDIES:  On June 05, 2011, lower extremity Doppler, no evidence of DVT or superficial vein thrombosis.  No evidence of Baker cyst.  HISTORY OF PRESENT ILLNESS:  Summer Cook is a 45 year old white female with a known past medical history of hypertension, depression, ITP, status post splenectomy in May 2012, on and off steroids since December 2011, who presented to the emergency department at [facility] on May 30, 2011, for bilateral lower extremity pain related to edema, was given fluids and sent home.  She followed up with Dr. [name] in the office on  June 04, 2011 and was noted to have profoundly low platelets, which were less than 5 at that time, and was admitted to [facility].  Overnight on June 04, 2011, she had an acute decompensation with hypoxia and tachypnea with desaturation of oxygen into the 60s.  She was transferred to [facility] on June 05, 2011 for further care.  She was admitted to the Intensive Care Unit and placed on Solu-Medrol 60 mg IV q.6.  Hematology/Oncology was consulted for further recommendations regarding steroid use.  She was placed on empiric vancomycin and Zosyn initially for pneumonia broad spectrum coverage, supplemental oxygen.  She was pancultured and cultures to date are negative.  Given the concern for lower extremity pain and swelling, she was ruled out for DVT as she did have a positive D-dimer.  The positive D-dimer was thought secondary to ITP.  Lower extremity Doppler was negative for DVT and she was placed on SCDs for DVT prophylaxis. After 24 hours, she was diuresed with significant volume removal and respiratory symptoms improved.  She indicated that she felt that she had been swelling while on steroids and felt much better postdiuresis.  She was able to be weaned off oxygen postdiuresis.  She was placed on sliding scale insulin during hospitalization to achieve normal glucose control in the setting of steroid use.  Initially,  her blood pressure was mildly low and her home antihypertensive regimen was held.  At time of discharge, she will be resumed on home antihypertensive regimen as well as p.r.n. sliding scale insulin for use when on steroids.  At time of discharge, it was discussed with Dr. Truddie Coco regarding steroid dosing and she will be discharged on 40 mg prednisone p.o. daily.  Also noted during hospitalization, she had transient hypophosphatemia and hypokalemia, which were treated as needed and at time of discharge, electrolytes are within normal limits.  She indicated during  ICU admission that she had a right lower molar at that was concerning for abscess and she has no need for dental work; however, has not been able to see the dentist due to low platelets.  At time of dictation, Summer Cook is medically stable and cleared for discharge pending follow up with Dr. Truddie Coco and her primary care provider, Dr. Hilma Favors.  She did indicate during the hospitalization that she as been on Celexa and would like to transition back to Zoloft as she felt better on Zoloft, this can be done as an outpatient with primary care provider.  HOSPITAL COURSE BY DISCHARGE DIAGNOSES: 1. Acute respiratory distress/hypoxemia.  As per History of present     illness, Summer Cook was admitted to [facility] on June 05, 2011     after being transferred from [facility] with acute     hypoxemia, tachypnea, and shortness of breath.  She was empirically     covered for pneumonia; however, after evaluation and monitoring in     the intensive care unit, it was felt that this likely related to     volume, not acute infectious process.  She was diuresed and did     significantly better with diuresis.  Her oxygen requirements were     able to be weaned off. 2. Idiopathic thrombocytopenic purpura.  Summer Cook has known     idiopathic thrombocytopenic purpura and is status post splenectomy     and has been on and off steroids since December 2011.  Currently,     she presented with platelets less than 5 and initially was placed     on IV Solu-Medrol.  After consultation with Dr. Truddie Coco from     Hematology, it was felt that she can be discharged on prednisone 40     mg p.o. daily and follow up with him at the [facility]. 3. Lower extremity pain.  Summer Cook presented to [facility] with     lower extremity pain.  She previously had been seen at [facility]     and discharged home with this after being treated with fluids.  She     felt that this was related to edema and swelling.  Her lower      extremity pain did resolve with diuresis and volume removal. 4. Hyperglycemia.  Ms. Braner has been on and off steroids since on     December 2011 in the setting of idiopathic thrombocytopenic     purpura.  Her hemoglobin A1c is 7.1.  At time of discharge, she     will be given a p.r.n. sliding scale insulin for use while on     steroids as an outpatient.  This will need to be followed up on     with her primary care provider. 5. Hyperphosphatemia/hypokalemia.  This was transient during     hospitalization and monitored serially.  At time of discharge  dictation, her electrolytes are within normal limits. 6. Questionable right lower molar abscess.  Ms. Ermel has known     dental problems with questionable lower molar abscess.  However,     she is unable to see a dentist due to her low platelets.  She was     transitioned initially from empiric antibiotics for pneumonia     coverage to Augmentin for dental coverage at time of discharge and     will continue for 5 more days on p.o. Augmentin.  DISCHARGE INSTRUCTIONS: 1. Activity.  Activity as tolerated. 2. Diet.  Heart healthy. 3. Follow up.  She is to follow up with Dr. Sharilyn Sites in [place]     on Monday, June 15, 2011 a 10:45 a.m.  She also is scheduled     to follow up with Dr. Truddie Coco.  His office will call for followup     appointment.  DISCHARGE MEDICATIONS: 1. Augmentin 875 by mouth twice daily. 2. NovoLog sliding scale insulin, to be used while on steroids. 3. Prednisone 20 mg two tablets by mouth daily. 4. Alprazolam 1 mg by mouth daily at bedtime as needed. 5. Colace six capsules by mouth daily. 6. Lexapro 20 mg by mouth daily. 7. Nutritional supplement one tablet by mouth daily. 8. First Duke's mouthwash 5 mL in mouth or throat four times daily. 9. Oxycodone/acetaminophen 5/325 one to two tablets by mouth every 6     hours as needed for pain. 10.Triamterene/hydrochlorothiazide 37.5/25 two capsules by mouth  every     morning. 11.Vitamin B12 one tablet by mouth daily.  DISPOSITION AT TIME OF DISCHARGE:  Ms. Sherron has met maximum benefit of inpatient therapy, is currently medically stable and cleared for discharge, pending follow up with Dr. Truddie Coco and Dr. Hilma Favors as above.     Noe Gens, NP   ______________________________ Providence Lanius, MD    BO/MEDQ  D:  06/08/2011  T:  06/09/2011  Job:  [id number]  cc:   Eston Esters, M.D., F.R.C.P.C. Halford Chessman, M.D.  Electronically Signed by Noe Gens  on 06/15/2011 05:46:41 PM Electronically Signed by Jennet Maduro MD on 06/18/2011 02:58:24 PM

## 2011-06-17 ENCOUNTER — Encounter (HOSPITAL_BASED_OUTPATIENT_CLINIC_OR_DEPARTMENT_OTHER): Payer: BC Managed Care – PPO | Admitting: Oncology

## 2011-06-17 ENCOUNTER — Other Ambulatory Visit: Payer: Self-pay | Admitting: Oncology

## 2011-06-17 DIAGNOSIS — D6941 Evans syndrome: Secondary | ICD-10-CM

## 2011-06-17 DIAGNOSIS — D693 Immune thrombocytopenic purpura: Secondary | ICD-10-CM

## 2011-06-17 LAB — LACTATE DEHYDROGENASE: LDH: 1003 U/L — ABNORMAL HIGH (ref 94–250)

## 2011-06-17 LAB — CBC WITH DIFFERENTIAL/PLATELET
BASO%: 0.4 % (ref 0.0–2.0)
Eosinophils Absolute: 0 10*3/uL (ref 0.0–0.5)
HCT: 36.4 % (ref 34.8–46.6)
HGB: 11.5 g/dL — ABNORMAL LOW (ref 11.6–15.9)
MCHC: 31.6 g/dL (ref 31.5–36.0)
MONO#: 0.1 10*3/uL (ref 0.1–0.9)
NEUT#: 4.3 10*3/uL (ref 1.5–6.5)
NEUT%: 79.7 % — ABNORMAL HIGH (ref 38.4–76.8)
Platelets: 60 10*3/uL — ABNORMAL LOW (ref 145–400)
WBC: 5.4 10*3/uL (ref 3.9–10.3)
lymph#: 0.9 10*3/uL (ref 0.9–3.3)
nRBC: 14 % — ABNORMAL HIGH (ref 0–0)

## 2011-06-17 LAB — COMPREHENSIVE METABOLIC PANEL
Albumin: 2.9 g/dL — ABNORMAL LOW (ref 3.5–5.2)
BUN: 17 mg/dL (ref 6–23)
CO2: 30 mEq/L (ref 19–32)
Calcium: 9.1 mg/dL (ref 8.4–10.5)
Chloride: 96 mEq/L (ref 96–112)
Glucose, Bld: 135 mg/dL — ABNORMAL HIGH (ref 70–99)
Potassium: 3.6 mEq/L (ref 3.5–5.3)
Total Protein: 8.5 g/dL — ABNORMAL HIGH (ref 6.0–8.3)

## 2011-06-18 LAB — HAPTOGLOBIN: Haptoglobin: 8 mg/dL — ABNORMAL LOW (ref 30–200)

## 2011-06-18 LAB — DIRECT ANTIGLOBULIN TEST (NOT AT ARMC): DAT (Complement): NEGATIVE

## 2011-06-19 ENCOUNTER — Ambulatory Visit (HOSPITAL_COMMUNITY): Payer: BC Managed Care – PPO | Admitting: Oncology

## 2011-06-24 ENCOUNTER — Encounter (HOSPITAL_BASED_OUTPATIENT_CLINIC_OR_DEPARTMENT_OTHER): Payer: BC Managed Care – PPO | Admitting: Oncology

## 2011-06-24 ENCOUNTER — Other Ambulatory Visit: Payer: Self-pay | Admitting: Oncology

## 2011-06-24 DIAGNOSIS — D6941 Evans syndrome: Secondary | ICD-10-CM

## 2011-06-24 LAB — BASIC METABOLIC PANEL
CO2: 29 mEq/L (ref 19–32)
Glucose, Bld: 222 mg/dL — ABNORMAL HIGH (ref 70–99)
Potassium: 3.4 mEq/L — ABNORMAL LOW (ref 3.5–5.3)
Sodium: 137 mEq/L (ref 135–145)

## 2011-06-24 LAB — CBC & DIFF AND RETIC
BASO%: 0.9 % (ref 0.0–2.0)
Eosinophils Absolute: 0 10*3/uL (ref 0.0–0.5)
MCHC: 31.5 g/dL (ref 31.5–36.0)
MCV: 89.5 fL (ref 79.5–101.0)
MONO%: 8.5 % (ref 0.0–14.0)
NEUT#: 4.2 10*3/uL (ref 1.5–6.5)
RBC: 4.01 10*6/uL (ref 3.70–5.45)
RDW: 23.4 % — ABNORMAL HIGH (ref 11.2–14.5)
Retic %: 4.82 % — ABNORMAL HIGH (ref 0.70–2.10)
WBC: 5.3 10*3/uL (ref 3.9–10.3)
nRBC: 11 % — ABNORMAL HIGH (ref 0–0)

## 2011-06-24 LAB — MORPHOLOGY

## 2011-07-01 ENCOUNTER — Other Ambulatory Visit: Payer: Self-pay | Admitting: Oncology

## 2011-07-01 ENCOUNTER — Encounter (HOSPITAL_BASED_OUTPATIENT_CLINIC_OR_DEPARTMENT_OTHER): Payer: BC Managed Care – PPO | Admitting: Oncology

## 2011-07-01 DIAGNOSIS — D6941 Evans syndrome: Secondary | ICD-10-CM

## 2011-07-01 LAB — MORPHOLOGY: PLT EST: DECREASED

## 2011-07-01 LAB — CBC & DIFF AND RETIC
Basophils Absolute: 0 10*3/uL (ref 0.0–0.1)
Eosinophils Absolute: 0 10*3/uL (ref 0.0–0.5)
HCT: 37.6 % (ref 34.8–46.6)
Immature Retic Fract: 12.1 % — ABNORMAL HIGH (ref 1.60–10.00)
LYMPH%: 27.4 % (ref 14.0–49.7)
MONO#: 0.3 10*3/uL (ref 0.1–0.9)
NEUT#: 3.1 10*3/uL (ref 1.5–6.5)
NEUT%: 65.6 % (ref 38.4–76.8)
Platelets: 61 10*3/uL — ABNORMAL LOW (ref 145–400)
WBC: 4.7 10*3/uL (ref 3.9–10.3)

## 2011-07-01 LAB — CHCC SMEAR

## 2011-07-14 NOTE — Telephone Encounter (Signed)
Just trying to close the encounter.

## 2011-07-15 ENCOUNTER — Other Ambulatory Visit: Payer: Self-pay | Admitting: Physician Assistant

## 2011-07-15 ENCOUNTER — Encounter (HOSPITAL_BASED_OUTPATIENT_CLINIC_OR_DEPARTMENT_OTHER): Payer: BC Managed Care – PPO | Admitting: Oncology

## 2011-07-15 ENCOUNTER — Other Ambulatory Visit: Payer: Self-pay

## 2011-07-15 DIAGNOSIS — D6941 Evans syndrome: Secondary | ICD-10-CM

## 2011-07-15 DIAGNOSIS — R0602 Shortness of breath: Secondary | ICD-10-CM

## 2011-07-15 LAB — CBC WITH DIFFERENTIAL/PLATELET
BASO%: 2.4 % — ABNORMAL HIGH (ref 0.0–2.0)
EOS%: 0.5 % (ref 0.0–7.0)
Eosinophils Absolute: 0 10*3/uL (ref 0.0–0.5)
MCV: 83.2 fL (ref 79.5–101.0)
MONO%: 12 % (ref 0.0–14.0)
NEUT#: 2.4 10*3/uL (ref 1.5–6.5)
RBC: 4.75 10*6/uL (ref 3.70–5.45)
RDW: 18.9 % — ABNORMAL HIGH (ref 11.2–14.5)
nRBC: 0 % (ref 0–0)

## 2011-07-15 LAB — COMPREHENSIVE METABOLIC PANEL
ALT: 27 U/L (ref 0–35)
AST: 36 U/L (ref 0–37)
Alkaline Phosphatase: 42 U/L (ref 39–117)
CO2: 27 mEq/L (ref 19–32)
Sodium: 139 mEq/L (ref 135–145)
Total Bilirubin: 0.3 mg/dL (ref 0.3–1.2)
Total Protein: 6.6 g/dL (ref 6.0–8.3)

## 2011-07-15 LAB — RETICULOCYTES
Immature Retic Fract: 12.6 % — ABNORMAL HIGH (ref 1.60–10.00)
RBC: 4.79 10*6/uL (ref 3.70–5.45)
Retic Ct Abs: 77.12 10*3/uL (ref 33.70–90.70)

## 2011-07-16 LAB — HAPTOGLOBIN: Haptoglobin: 123 mg/dL (ref 30–200)

## 2011-08-06 ENCOUNTER — Telehealth: Payer: Self-pay | Admitting: *Deleted

## 2011-08-06 NOTE — Telephone Encounter (Signed)
RN left msg for patient to return call due to decreased potassium level on 08/03/11.  Dr Truddie Coco has ordered oral potassium and I would like to know the pharmacy she would like this called in to.

## 2011-08-06 NOTE — Telephone Encounter (Signed)
Called [facility] take one po q day #30 w/no refill per Dr Truddie Coco  Pt aware and verbalizes understanding

## 2011-08-07 ENCOUNTER — Telehealth: Payer: Self-pay | Admitting: *Deleted

## 2011-08-07 NOTE — Telephone Encounter (Signed)
Returned patient call and advised that patient rec'd rx for 90 days supply of Folic Acid and Cytoxan and Zoloft.  These were faxed to [facility].  Pt states that they said there would be a delay on these medications but she wasn't sure which one those were.

## 2011-08-12 ENCOUNTER — Ambulatory Visit: Payer: BC Managed Care – PPO | Admitting: Physician Assistant

## 2011-08-12 ENCOUNTER — Other Ambulatory Visit: Payer: BC Managed Care – PPO | Admitting: Lab

## 2011-08-13 ENCOUNTER — Telehealth: Payer: Self-pay | Admitting: *Deleted

## 2011-08-13 NOTE — Telephone Encounter (Signed)
Rescheduled missed appointments to 08-24-2011 starting at 12:45pm.

## 2011-08-17 ENCOUNTER — Other Ambulatory Visit: Payer: Self-pay | Admitting: *Deleted

## 2011-08-17 ENCOUNTER — Encounter: Payer: Self-pay | Admitting: *Deleted

## 2011-08-19 ENCOUNTER — Encounter (HOSPITAL_COMMUNITY): Payer: Self-pay | Admitting: Emergency Medicine

## 2011-08-19 ENCOUNTER — Ambulatory Visit: Payer: BC Managed Care – PPO | Admitting: Oncology

## 2011-08-19 ENCOUNTER — Emergency Department (HOSPITAL_COMMUNITY)
Admission: EM | Admit: 2011-08-19 | Discharge: 2011-08-19 | Disposition: A | Discharge status: Home / Self Care | Payer: BC Managed Care – PPO | Attending: Emergency Medicine | Admitting: Emergency Medicine

## 2011-08-19 DIAGNOSIS — D6941 Evans syndrome: Secondary | ICD-10-CM | POA: Insufficient documentation

## 2011-08-19 DIAGNOSIS — I10 Essential (primary) hypertension: Secondary | ICD-10-CM | POA: Insufficient documentation

## 2011-08-19 DIAGNOSIS — M79609 Pain in unspecified limb: Secondary | ICD-10-CM | POA: Insufficient documentation

## 2011-08-19 DIAGNOSIS — M79604 Pain in right leg: Secondary | ICD-10-CM

## 2011-08-19 DIAGNOSIS — M79605 Pain in left leg: Secondary | ICD-10-CM

## 2011-08-19 DIAGNOSIS — D696 Thrombocytopenia, unspecified: Secondary | ICD-10-CM | POA: Insufficient documentation

## 2011-08-19 LAB — CBC
HCT: 39.2 % (ref 36.0–46.0)
Hemoglobin: 12.4 g/dL (ref 12.0–15.0)
MCH: 25.3 pg — ABNORMAL LOW (ref 26.0–34.0)
MCHC: 31.6 g/dL (ref 30.0–36.0)
MCV: 80 fL (ref 78.0–100.0)
Platelets: 19 10*3/uL — CL (ref 150–400)
RBC: 4.9 MIL/uL (ref 3.87–5.11)
RDW: 18.5 % — ABNORMAL HIGH (ref 11.5–15.5)
WBC: 3.7 10*3/uL — ABNORMAL LOW (ref 4.0–10.5)

## 2011-08-19 LAB — GLUCOSE, CAPILLARY: Glucose-Capillary: 87 mg/dL (ref 70–99)

## 2011-08-19 LAB — BASIC METABOLIC PANEL
BUN: 14 mg/dL (ref 6–23)
CO2: 29 mEq/L (ref 19–32)
Calcium: 10.1 mg/dL (ref 8.4–10.5)
Chloride: 103 mEq/L (ref 96–112)
Creatinine, Ser: 1.14 mg/dL — ABNORMAL HIGH (ref 0.50–1.10)
GFR calc Af Amer: 66 mL/min — ABNORMAL LOW (ref >90)
GFR calc non Af Amer: 57 mL/min — ABNORMAL LOW (ref >90)
Glucose, Bld: 93 mg/dL (ref 70–99)
Potassium: 3.3 mEq/L — ABNORMAL LOW (ref 3.5–5.1)
Sodium: 140 mEq/L (ref 135–145)

## 2011-08-19 MED ORDER — OXYCODONE-ACETAMINOPHEN 5-325 MG PO TABS
1.0000 | ORAL_TABLET | Freq: Once | ORAL | Status: AC
  Administered 2011-08-19: 1 via ORAL
  Filled 2011-08-19: qty 1

## 2011-08-19 NOTE — ED Notes (Signed)
CRITICAL VALUE ALERT  Critical value received:  platletts 19  Date of notification:  08/19/11  Time of notification:  1905  Critical value read back:yes  Nurse who received alert:  Derek Mound, RN  MD notified (1st page):  08/19/11  Time of first page:  1907  MD notified (2nd page):  Time of second page:  Responding MD:  Wilson Singer  Time MD responded:  [phone]

## 2011-08-19 NOTE — ED Notes (Signed)
Pt given discharge instructions & paperwork, pt verbalized understanding.

## 2011-08-19 NOTE — ED Notes (Signed)
Pt c/o leg pain. 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 Pt also c/o right ear pain. nad noted.

## 2011-08-19 NOTE — ED Notes (Signed)
Pt states her legs feel heavy. The pain in her legs is better at this time. No needs voiced at this time.

## 2011-08-19 NOTE — ED Provider Notes (Addendum)
History  Scribed for Virgel Manifold, MD, the patient was seen in room APA03. This chart was scribed by Hortense Ramal.    CSN: [id number] Arrival date & time: 08/19/2011  5:09 PM   First MD Initiated Contact with Patient 08/19/11 1719      Chief Complaint  Patient presents with  . Leg Pain    HPI  Summer Cook is a 45 y.o. female who presents to the Emergency Department complaining of constant bilateral leg pain left worse than right. Pt states pain has been going on for the last two weeks and it is worse today. She states the pain is independent of body position but is aggravated with movement. Pt complains of numbness in left heel. Pt denies urinary symptoms, fever and chills. Pt admits to thrombocytopenia dx last November with fatigue and an admission this September for the same. Pt additionally complains of chronic headache and weight gain since steroid use.   Past Medical History  Diagnosis Date  . Hypertension   . ITP (idiopathic thrombocytopenic purpura)   . Platelets decreased   . Depression   . Thrush, oral 05/29/2011  . Evan's syndrome     Past Surgical History  Procedure Date  . Cholecystectomy   . Bone marrow biopsy   . Splenectomy, total     Family History  Problem Relation Age of Onset  . Cancer Mother   . Cervical cancer Mother   . Cancer Father   . Lung cancer Father   . Pneumonia Brother     History  Substance Use Topics  . Smoking status: Former Smoker    Quit date: 02/15/2011  . Smokeless tobacco: Never Used  . Alcohol Use: No     Review of Systems   Review of symptoms negative unless otherwise noted in HPI.   Allergies  Yellow jacket venom  Home Medications   Current Outpatient Rx  Name Route Sig Dispense Refill  . ACETAMINOPHEN ER 650 MG PO TBCR Oral Take 1,950 mg by mouth as needed. For pain     . ALPRAZOLAM 0.5 MG PO TABS  Take one - two tablets, PO, at HS 45 tablet 0  . CYCLOPHOSPHAMIDE 50 MG PO TABS Oral Take  100 mg by mouth at bedtime. Give on an empty stomach 1 hour before or 2 hours after meals.      Marland Kitchen FOLIC ACID 1 MG PO TABS Oral Take 1 mg by mouth daily.      Marland Kitchen PROCHLORPERAZINE MALEATE 10 MG PO TABS Oral Take 10 mg by mouth as needed. For nausea     . SERTRALINE HCL 50 MG PO TABS Oral Take 100 mg by mouth daily.      . TRIAMTERENE-HCTZ 37.5-25 MG PO CAPS Oral Take 2 capsules by mouth every morning.       Triage vitals: BP 120/81  Pulse 102  Temp 98 F (36.7 C)  Resp 18  Ht 5\' 5"  (1.651 m)  Wt 190 lb (86.183 kg)  BMI 31.62 kg/m2  SpO2 97%  Physical Exam  Nursing note and vitals reviewed. Constitutional: She is oriented to person, place, and time. She appears well-developed and well-nourished.  HENT:  Head: Normocephalic and atraumatic.       TM intact and clear bilaterally   Eyes: Conjunctivae and EOM are normal. Pupils are equal, round, and reactive to light.  Neck: Normal range of motion. Neck supple.  Cardiovascular: Normal rate, regular rhythm and normal heart sounds.   Pulmonary/Chest: Effort normal  and breath sounds normal. No respiratory distress. She has no wheezes.  Abdominal: Soft. She exhibits no distension. There is no tenderness. There is no rebound.  Musculoskeletal: Normal range of motion. She exhibits no edema and no tenderness.       Feet were warm. Bilateral pedal pulse normal and external appearance normal.  Lymphadenopathy:    She has no cervical adenopathy.  Neurological: She is alert and oriented to person, place, and time.  Skin: Skin is warm and dry. No rash noted. No erythema.    ED Course  Procedures   Labs Reviewed  CBC - Abnormal; Notable for the following:    WBC 3.7 (*)    MCH 25.3 (*)    RDW 18.5 (*)    Platelets 19 (*)    All other components within normal limits  BASIC METABOLIC PANEL - Abnormal; Notable for the following:    Potassium 3.3 (*)    Creatinine, Ser 1.14 (*)    GFR calc non Af Amer 57 (*)    GFR calc Af Amer 66 (*)     All other components within normal limits   No results found.   OTHER DATA REVIEWED: Nursing notes, vital signs, and past medical records reviewed.    DIAGNOSTIC STUDIES: Oxygen Saturation is 97% on room air, normal by my interpretation.     ED COURSE / COORDINATION OF CARE: 18:00. Ordered: CBC ; Basic metabolic panel ; oxyCODONE-acetaminophen (PERCOCET) 5-325 MG per tablet 1 tablet  19:11 Worsened thrombocytopenia noted. 41K 10/17. Hemoglobin normal. No clinical evidence of blood loss. Pt currently on cytoxan. Not currently taking steroids. Will discuss with hem/onc for further recs.  19:35 Discussed test results and treatment plan with pt. Dr. Earlie Server was the hematology consult. Discussed that w/ stable vital signs and no clinical evidence of bleeding that does not think further w/u needed at this time. Pt will be d/c to home and was told to follow up with Dr. Truddie Coco as needed  MDM   45yF with leg pain and thrombocytopenia. No clinical evidence of bleeding. Leg pain chronic in nature. No hx of trauma. Low clinical suspicion for dvt. nonfocal neuro exam. Discussed thrombocytopenia with hem/onc and feel that outpt fu appropriate.   1. Leg pain, bilateral   2. Thrombocytopenia      I personally preformed the services scribed in my presence. The recorded information has been reviewed and considered. Virgel Manifold, MD.    Virgel Manifold, MD 08/22/11 1654  Virgel Manifold, MD 11/19/11 [place], MD 11/19/11 [phone]

## 2011-08-24 ENCOUNTER — Ambulatory Visit (HOSPITAL_BASED_OUTPATIENT_CLINIC_OR_DEPARTMENT_OTHER): Payer: BC Managed Care – PPO | Admitting: Physician Assistant

## 2011-08-24 ENCOUNTER — Other Ambulatory Visit: Payer: BC Managed Care – PPO | Admitting: Lab

## 2011-08-24 ENCOUNTER — Other Ambulatory Visit: Payer: Self-pay

## 2011-08-24 ENCOUNTER — Telehealth: Payer: Self-pay | Admitting: *Deleted

## 2011-08-24 ENCOUNTER — Other Ambulatory Visit: Payer: Self-pay | Admitting: Physician Assistant

## 2011-08-24 VITALS — BP 124/86 | HR 112 | Temp 98.1°F | Ht 65.0 in | Wt 187.3 lb

## 2011-08-24 DIAGNOSIS — D693 Immune thrombocytopenic purpura: Secondary | ICD-10-CM

## 2011-08-24 LAB — CHCC SMEAR

## 2011-08-24 LAB — CBC & DIFF AND RETIC
BASO%: 2.8 % — ABNORMAL HIGH (ref 0.0–2.0)
EOS%: 1.1 % (ref 0.0–7.0)
Immature Retic Fract: 8.5 % (ref 1.60–10.00)
LYMPH%: 20.4 % (ref 14.0–49.7)
MCH: 24.8 pg — ABNORMAL LOW (ref 25.1–34.0)
MCHC: 32 g/dL (ref 31.5–36.0)
MONO#: 0.6 10*3/uL (ref 0.1–0.9)
RBC: 5.09 10*6/uL (ref 3.70–5.45)
Retic %: 1.36 % (ref 0.70–2.10)
WBC: 4.7 10*3/uL (ref 3.9–10.3)
lymph#: 1 10*3/uL (ref 0.9–3.3)

## 2011-08-24 NOTE — Progress Notes (Signed)
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  CURRENT THERAPY:  Cytoxan 100 mg per day.  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  Of note, she continues on her Cytoxan 100 mg per day.  She is off the Lasix.  She was seen in the [facility] Emergency Department on 08/19/2011 complaining of bilateral lower extremity pain.  She states that she did not have any specific testing done except for blood work, i.e., no ultrasounds or other radiographic imaging.  She denies any fevers or chills.  She continues to feel "puffy."  Of note, she is off her prednisone, and she does take 2 of her Dyazide per day.  Her medication list has been reviewed and updated.  REVIEW OF SYSTEMS:  Otherwise negative.  ALLERGIES:  No known drug allergies.  CURRENT MEDICATIONS:  Reviewed as per EMR.  ECOG STATUS:  1.  PHYSICAL EXAM:  Vital Signs:  Blood pressure is 124/86, pulse 112, respirations 20, temp 98.1, weight 187 pounds.  HEENT:  Conjunctivae pink.  Sclerae anicteric.  Oropharynx is benign without oral mucositis or candidosis.  Lungs:  Clear to auscultation.  No evidence of wheezing or rhonchi.  Heart:  Regular rate and rhythm without murmurs, rubs, gallops, or clicks.  Abdomen:  Soft, nontender.  No organomegaly. Normal bowel sounds.  Extremities:  Benign.  Neurologic:  Nonfocal.  LABORATORY DATA:  Hemoglobin 12.6 g, platelet count 23,000, WBC 4700 with an ANC of 3000.  C-met, LDH, and haptoglobin are pending.  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  The case has been follow reviewed with Dr. Truddie Coco who also spoke with Levada Dy.  PLAN:  At this point, we feel that it is probably very  prudent to obtain a rheumatology consultation with Dr. Gavin Pound.  Referral has been placed.  Marles wishes to decrease her Cytoxan down from 100 to 50 mg which we will do but check CBCs weekly, specifically through the [facility].  We will officially see her back in approximately 4 weeks.  She understands and agrees with this plan.   ______________________________ Honor Junes, PA Dictating For Eston Esters, M.D., F.R.C.P.C. CS/MEDQ  D:  08/24/2011  T:  08/24/2011  Job:  [id number]

## 2011-08-24 NOTE — Telephone Encounter (Signed)
GAVE PATIENT APPOINTMENT FOR [place] AT [place] ON 08-26-2011 ARRIVAL TIME 9:45AM. LEFT PATIENT VOICE MESSAGE ON 08-24-2011 ASKED PATIENT TO CALL ME BACK AND CONFIRM SHE GOT THE MESSAGE.

## 2011-08-24 NOTE — Progress Notes (Signed)
Progress note dictated-CTS 

## 2011-08-25 LAB — COMPREHENSIVE METABOLIC PANEL
Alkaline Phosphatase: 48 U/L (ref 39–117)
CO2: 27 mEq/L (ref 19–32)
Creatinine, Ser: 1.23 mg/dL — ABNORMAL HIGH (ref 0.50–1.10)
Glucose, Bld: 116 mg/dL — ABNORMAL HIGH (ref 70–99)
Sodium: 139 mEq/L (ref 135–145)
Total Bilirubin: 0.4 mg/dL (ref 0.3–1.2)
Total Protein: 6.2 g/dL (ref 6.0–8.3)

## 2011-08-25 LAB — LACTATE DEHYDROGENASE: LDH: 296 U/L — ABNORMAL HIGH (ref 94–250)

## 2011-08-25 LAB — HAPTOGLOBIN: Haptoglobin: 67 mg/dL (ref 30–200)

## 2011-08-26 ENCOUNTER — Ambulatory Visit (HOSPITAL_COMMUNITY)
Admission: RE | Admit: 2011-08-26 | Discharge: 2011-08-26 | Disposition: A | Discharge status: Home / Self Care | Payer: BC Managed Care – PPO | Source: Ambulatory Visit | Attending: Physician Assistant | Admitting: Physician Assistant

## 2011-08-26 DIAGNOSIS — M79609 Pain in unspecified limb: Secondary | ICD-10-CM | POA: Insufficient documentation

## 2011-08-26 DIAGNOSIS — D693 Immune thrombocytopenic purpura: Secondary | ICD-10-CM

## 2011-08-28 ENCOUNTER — Telehealth: Payer: Self-pay | Admitting: *Deleted

## 2011-08-28 NOTE — Telephone Encounter (Signed)
Pt states that her energy level is down.  She states that she had taken prednisone in the past and was questioning if Dr Truddie Coco or Altha Harm would prescribe prednisone for her.  She would also like her doppler results.  Will review with Honor Junes PA and notify patient.

## 2011-09-02 ENCOUNTER — Other Ambulatory Visit (HOSPITAL_COMMUNITY): Payer: Self-pay | Admitting: Internal Medicine

## 2011-09-03 ENCOUNTER — Other Ambulatory Visit (HOSPITAL_COMMUNITY): Payer: Self-pay | Admitting: Internal Medicine

## 2011-09-03 DIAGNOSIS — R5381 Other malaise: Secondary | ICD-10-CM

## 2011-09-03 DIAGNOSIS — R5383 Other fatigue: Secondary | ICD-10-CM

## 2011-09-04 ENCOUNTER — Ambulatory Visit (HOSPITAL_COMMUNITY)
Admission: RE | Admit: 2011-09-04 | Discharge: 2011-09-04 | Disposition: A | Discharge status: Home / Self Care | Payer: BC Managed Care – PPO | Source: Ambulatory Visit | Attending: Internal Medicine | Admitting: Internal Medicine

## 2011-09-04 DIAGNOSIS — M545 Low back pain, unspecified: Secondary | ICD-10-CM | POA: Insufficient documentation

## 2011-09-04 DIAGNOSIS — R5381 Other malaise: Secondary | ICD-10-CM

## 2011-09-04 DIAGNOSIS — M79609 Pain in unspecified limb: Secondary | ICD-10-CM | POA: Insufficient documentation

## 2011-09-11 ENCOUNTER — Telehealth: Payer: Self-pay | Admitting: *Deleted

## 2011-09-11 NOTE — Telephone Encounter (Signed)
Per MD, pt has been notified to stop cytoxan until next MD visit in dec.26th

## 2011-09-23 ENCOUNTER — Telehealth: Payer: Self-pay | Admitting: *Deleted

## 2011-09-23 ENCOUNTER — Ambulatory Visit: Payer: BC Managed Care – PPO | Admitting: Oncology

## 2011-09-23 ENCOUNTER — Other Ambulatory Visit: Payer: BC Managed Care – PPO | Admitting: Lab

## 2011-09-23 NOTE — Telephone Encounter (Signed)
left voice message asked patient to please call me back to get her appointment rescheduled

## 2011-09-23 NOTE — Telephone Encounter (Signed)
gave patient appointment for 10-07-2011 at 3:00pm patient reschedule from 09-23-2011 patient confirmed over the phone the new date and time

## 2011-10-07 ENCOUNTER — Ambulatory Visit (HOSPITAL_BASED_OUTPATIENT_CLINIC_OR_DEPARTMENT_OTHER): Payer: BC Managed Care – PPO

## 2011-10-07 ENCOUNTER — Ambulatory Visit (HOSPITAL_BASED_OUTPATIENT_CLINIC_OR_DEPARTMENT_OTHER): Payer: BC Managed Care – PPO | Admitting: Oncology

## 2011-10-07 VITALS — BP 115/81 | HR 97 | Temp 98.4°F | Ht 65.0 in | Wt 183.1 lb

## 2011-10-07 DIAGNOSIS — D693 Immune thrombocytopenic purpura: Secondary | ICD-10-CM

## 2011-10-07 LAB — COMPREHENSIVE METABOLIC PANEL
AST: 38 U/L — ABNORMAL HIGH (ref 0–37)
Albumin: 3.8 g/dL (ref 3.5–5.2)
BUN: 15 mg/dL (ref 6–23)
CO2: 27 mEq/L (ref 19–32)
Calcium: 9.3 mg/dL (ref 8.4–10.5)
Chloride: 101 mEq/L (ref 96–112)
Creatinine, Ser: 1.33 mg/dL — ABNORMAL HIGH (ref 0.50–1.10)
Glucose, Bld: 96 mg/dL (ref 70–99)
Potassium: 3.4 mEq/L — ABNORMAL LOW (ref 3.5–5.3)

## 2011-10-07 LAB — MORPHOLOGY: PLT EST: DECREASED

## 2011-10-07 LAB — CBC WITH DIFFERENTIAL/PLATELET
Basophils Absolute: 0.1 10*3/uL (ref 0.0–0.1)
Eosinophils Absolute: 0.1 10*3/uL (ref 0.0–0.5)
HCT: 36.6 % (ref 34.8–46.6)
HGB: 11.5 g/dL — ABNORMAL LOW (ref 11.6–15.9)
MCH: 23.8 pg — ABNORMAL LOW (ref 25.1–34.0)
MONO#: 0.5 10*3/uL (ref 0.1–0.9)
NEUT#: 2.7 10*3/uL (ref 1.5–6.5)
NEUT%: 52.9 % (ref 38.4–76.8)
RDW: 19.3 % — ABNORMAL HIGH (ref 11.2–14.5)
lymph#: 1.7 10*3/uL (ref 0.9–3.3)

## 2011-10-07 LAB — LACTATE DEHYDROGENASE: LDH: 282 U/L — ABNORMAL HIGH (ref 94–250)

## 2011-10-07 LAB — HOLD TUBE, BLOOD BANK

## 2011-10-07 MED ORDER — CYCLOPHOSPHAMIDE 50 MG PO TABS: 50.0000 mg | ORAL_TABLET | Freq: Every day | ORAL | Status: DC

## 2011-10-07 NOTE — Progress Notes (Signed)
Hematology and Oncology Follow Up Visit  Summer Cook [id number] 08-12-1966 46 y.o. 10/07/2011 11:58 PM PCP  Principle Diagnosis: chronic ITP, s/p splenectomy , on po cytoxan till 50 mg po daily, till 1 month ago  Interim History:  There have been no intercurrent illness, hospitalizations or medication changes.  Medications: I have reviewed the patient's current medications.  Allergies:  Allergies  Allergen Reactions  . Yellow Jacket Venom     Past Medical History, Surgical history, Social history, and Family History were reviewed and updated.  Review of Systems: Constitutional:  Negative for fever, chills, night sweats, anorexia, weight loss, pain. Cardiovascular: no chest pain or dyspnea on exertion Respiratory: no cough, shortness of breath, or wheezing Neurological: negative Dermatological: negative ENT: negative Skin Gastrointestinal: no abdominal pain, change in bowel habits, or black or bloody stools Genito-Urinary: no dysuria, trouble voiding, or hematuria Hematological and Lymphatic: negative Breast: negative for breast lumps Musculoskeletal: negative, pain in LE , has improved once cytoxan has been d/ced Remaining ROS negative.  Physical Exam: Blood pressure 115/81, pulse 97, temperature 98.4 F (36.9 C), height 5\' 5"  (1.651 m), weight 183 lb 1.6 oz (83.054 kg). ECOG: 0 Limited exam No obvious petechiae or purpura  Lab Results: Lab Results  Component Value Date   WBC 5.1 10/07/2011   HGB 11.5* 10/07/2011   HCT 36.6 10/07/2011   MCV 75.6* 10/07/2011   PLT 9* 10/07/2011     Chemistry      Component Value Date/Time   NA 138 10/07/2011 1628   K 3.4* 10/07/2011 1628   CL 101 10/07/2011 1628   CO2 27 10/07/2011 1628   BUN 15 10/07/2011 1628   CREATININE 1.33* 10/07/2011 1628      Component Value Date/Time   CALCIUM 9.3 10/07/2011 1628   ALKPHOS 62 10/07/2011 1628   AST 38* 10/07/2011 1628   ALT 32 10/07/2011 1628   BILITOT 0.4 10/07/2011 1628      .pathology. Radiological  Studies: chest X-ray n/a Mammogram n/a Bone density n/a  Impression and Plan: ITP , with recurrent low plts; I have recommended restarting cytoxan at 50  Mg po qd, with decrease to 25 mg if plts improve. She will continue to have cbc s monthly thru [facility] in [place]. With any bleeding, she should receive IVIG 1g/k . I will see her in 1 month.  More than 50% of the visit was spent in patient-related counselling   Eston Esters, MD 1/9/201311:58 PM

## 2011-10-14 ENCOUNTER — Telehealth: Payer: Self-pay | Admitting: *Deleted

## 2011-10-14 NOTE — Telephone Encounter (Signed)
Patient called reporting she is afraid after receiving a message that her platelet count is still critically low.  "My platelet count has been low for a few weeks and I'm on chemotherapy but the cytoxan is raising it too slowly.  I would like to know if I can go back on the prednisone.  I was taking 80mg  per day and was tapered off. "  Will notify providers of this call.  Patient says she is in for the night.  Uses [facility].

## 2011-10-15 ENCOUNTER — Other Ambulatory Visit: Payer: Self-pay

## 2011-10-15 ENCOUNTER — Telehealth: Payer: Self-pay

## 2011-10-15 DIAGNOSIS — D693 Immune thrombocytopenic purpura: Secondary | ICD-10-CM

## 2011-10-15 MED ORDER — PREDNISONE 20 MG PO TABS: 80.0000 mg | ORAL_TABLET | Freq: Every day | ORAL | Status: AC

## 2011-10-15 NOTE — Telephone Encounter (Signed)
Pt notified by phone that Dr Truddie Coco would like for her to restart Prednisone at 80mg  daily.  Pt will remain on this dose until lab recheck on 2/13.  Note routed to schedulers to add lab appt before MD appt on 2/13.   Prednisone script to [place] per pt request. dph

## 2011-10-15 NOTE — Telephone Encounter (Signed)
Message left for pt to contact office re: previous call from 1/16 (entered by Mackie Pai, Therapist, sports).  dph

## 2011-10-20 ENCOUNTER — Telehealth: Payer: Self-pay | Admitting: *Deleted

## 2011-10-20 NOTE — Telephone Encounter (Signed)
Pt calls with concerns that she started prednisone 10/15/11. Prednisone usually "wires me and I have so much energy, but this time I'm fatigued" Reviewed this note with CS. Returned pt call, pt reports that she has no unusual bleeding or bruising, but does have a URI with yellow drainage. Pt reports that she will call her PCP for Tx. Pt also reports that she had her labs drawn by [facility] [place]. Will keep a watch for results 10/21/11

## 2011-11-02 ENCOUNTER — Other Ambulatory Visit: Payer: Self-pay | Admitting: *Deleted

## 2011-11-02 ENCOUNTER — Telehealth: Payer: Self-pay | Admitting: *Deleted

## 2011-11-02 DIAGNOSIS — D693 Immune thrombocytopenic purpura: Secondary | ICD-10-CM

## 2011-11-02 MED ORDER — AZITHROMYCIN 250 MG PO TABS: ORAL_TABLET | ORAL | Status: AC

## 2011-11-02 NOTE — Telephone Encounter (Signed)
Verbal order received and read back from Dr. Truddie Coco for a Z-pack for this patient. Called patient's home number.  Message left that this is being called to [place] requesting a return call for a different pharmacy/

## 2011-11-02 NOTE — Telephone Encounter (Signed)
Patient called asking for Dr. Truddie Coco to call in an antibiotic.  Reports a sinus infection.  Head congestion, cold in my eyes and when I blow my nose the nasal mucus is yellow.  Reports her PCP is closed this week.  Suggested she try an Urgent Care but she asked that I ask the on call MD first.  Will notify providers.

## 2011-11-04 ENCOUNTER — Telehealth: Payer: Self-pay

## 2011-11-04 NOTE — Telephone Encounter (Signed)
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  Pt is currently on short term disability that will expire in April.  Pt is questioning if Dr Truddie Coco will support her going on long term disability, "since my platelets are so unstable."  Pt reports her platelets last week were 316 & she is having labs drawn tomorrow.    Note to Dr Truddie Coco. dph

## 2011-11-09 ENCOUNTER — Other Ambulatory Visit: Payer: Self-pay | Admitting: *Deleted

## 2011-11-09 ENCOUNTER — Ambulatory Visit (HOSPITAL_BASED_OUTPATIENT_CLINIC_OR_DEPARTMENT_OTHER): Payer: BC Managed Care – PPO | Admitting: Oncology

## 2011-11-09 VITALS — BP 158/105 | HR 137 | Temp 98.7°F | Ht 65.0 in | Wt 192.6 lb

## 2011-11-09 DIAGNOSIS — D6941 Evans syndrome: Secondary | ICD-10-CM

## 2011-11-09 DIAGNOSIS — IMO0002 Reserved for concepts with insufficient information to code with codable children: Secondary | ICD-10-CM

## 2011-11-09 DIAGNOSIS — D693 Immune thrombocytopenic purpura: Secondary | ICD-10-CM

## 2011-11-09 MED ORDER — FLUCONAZOLE 100 MG PO TABS: 100.0000 mg | ORAL_TABLET | Freq: Every day | ORAL | Status: AC

## 2011-11-09 MED ORDER — SULFAMETHOXAZOLE-TRIMETHOPRIM 800-160 MG PO TABS: 1.0000 | ORAL_TABLET | Freq: Two times a day (BID) | ORAL | Status: AC

## 2011-11-09 MED ORDER — ACYCLOVIR 400 MG PO TABS: 400.0000 mg | ORAL_TABLET | Freq: Once | ORAL | Status: AC

## 2011-11-09 MED ORDER — MAGIC MOUTHWASH: 15.0000 mL | Freq: Three times a day (TID) | ORAL | Status: DC

## 2011-11-09 MED ORDER — CLINDAMYCIN PHOSPHATE 1 % EX SOLN: Freq: Two times a day (BID) | CUTANEOUS | Status: DC

## 2011-11-09 NOTE — Progress Notes (Signed)
Hematology and Oncology Follow Up Visit  PATT ANCAR [id number] 1966-05-04 46 y.o. 11/09/2011 3:39 PM PCP   Principle Diagnosis: Chronic ITP  Interim History:  There have been no intercurrent illness, hospitalizations or medication changes.  Medications: I have reviewed the patient's current medications.  Allergies:  Allergies  Allergen Reactions  . Yellow Jacket Venom     Past Medical History, Surgical history, Social history, and Family History were reviewed and updated.  Review of Systems: Constitutional:  Negative for fever, chills, night sweats, anorexia, weight loss, pain. Cardiovascular: no chest pain or dyspnea on exertion Respiratory: no cough, shortness of breath, or wheezing Neurological: no TIA or stroke symptoms Dermatological: negative ENT: negative Skin Gastrointestinal: no abdominal pain, change in bowel habits, or black or bloody stools Genito-Urinary: no dysuria, trouble voiding, or hematuria Hematological and Lymphatic: negative Breast: negative for breast lumps Musculoskeletal: negative Remaining ROS negative.  Physical Exam: Blood pressure 158/105, pulse 137, temperature 98.7 F (37.1 C), temperature source Oral, height 5\' 5"  (1.651 m), weight 192 lb 9.6 oz (87.363 kg). ECOG: 0 General appearance: alert, cooperative and appears stated age Head: Normocephalic, without obvious abnormality, atraumatic, + thrush Neck: no adenopathy, no carotid bruit, no JVD, supple, symmetrical, trachea midline and thyroid not enlarged, symmetric, no tenderness/mass/nodules Lymph nodes: Cervical, supraclavicular, and axillary nodes normal. Cardiac : regular rate and rhythm, no murmurs or gallops Pulmonary:clear to auscultation bilaterally and normal percussion bilaterally Breasts: inspection negative, no nipple discharge or bleeding, no masses or nodularity palpable Abdomen:soft, non-tender; bowel sounds normal; no masses,  no organomegaly Extremities negative,  acneiform areas on back. Neuro: alert, oriented, normal speech, no focal findings or movement disorder noted  Lab Results: Lab Results  Component Value Date   WBC 5.1 10/07/2011   HGB 11.5* 10/07/2011   HCT 36.6 10/07/2011   MCV 75.6* 10/07/2011   PLT 9* 10/07/2011     Chemistry      Component Value Date/Time   NA 138 10/07/2011 1628   K 3.4* 10/07/2011 1628   CL 101 10/07/2011 1628   CO2 27 10/07/2011 1628   BUN 15 10/07/2011 1628   CREATININE 1.33* 10/07/2011 1628      Component Value Date/Time   CALCIUM 9.3 10/07/2011 1628   ALKPHOS 62 10/07/2011 1628   AST 38* 10/07/2011 1628   ALT 32 10/07/2011 1628   BILITOT 0.4 10/07/2011 1628       Radiological Studies: chest X-ray n/a Mammogram n/a Bone density n/a  Impression and Plan:  The patient returns for followup. She is currently on 70 mg of prednisone daily. She is on 50 mg of Cytoxan daily. She feels great her energy level is good and she has not had any problems with bleeding or bruising. Her most recent CBC shows a platelet count of approximately 180. We discussed various strategies for dealing with her thrombocytopenia. I recommended for further decreases of her prednisone dose by 10 mg every 5 days, with weekly CBCs. From a prophylaxis point of view I have recommended she take a Septra DS one twice a day Monday Wednesday Friday as well as acyclovir 400 mg daily. She has evidence of thrush on today's exam as well as an acneiform rash on her back. I have recommended that with Diflucan, Magic mouthwash and Cleocin cream for her back as well. I plan to see her back in approximately a month. My hope is that the increased dose of Cytoxan allow her to wean yourself off the prednisone work more quickly. I've  recommended that she may want to see the physicians back at [facility] that she saw before. In addition I have suggested that one other option for treatment would be a monthly dose of IVIG which hopefully keep her platelets up for the balance of the  month. Another option could be monthly high-dose Decadron given 20 mg by mouth twice a day for 4 days.  More than 50% of the visit was spent in patient-related counselling   Eston Esters, MD 2/11/20133:39 PM

## 2011-11-11 ENCOUNTER — Ambulatory Visit: Payer: BC Managed Care – PPO | Admitting: Oncology

## 2011-11-11 ENCOUNTER — Encounter: Payer: Self-pay | Admitting: Oncology

## 2011-11-11 NOTE — Progress Notes (Signed)
Put disability form in the registration desk. 

## 2011-11-26 ENCOUNTER — Telehealth: Payer: Self-pay | Admitting: Oncology

## 2011-11-26 ENCOUNTER — Encounter: Payer: Self-pay | Admitting: Oncology

## 2011-11-26 NOTE — Progress Notes (Signed)
Faxed disability form to [phone].

## 2011-11-26 NOTE — Telephone Encounter (Signed)
Summer Cook left a message regarding her disability papers. Returned call no answer, left message to call be back.

## 2011-11-30 ENCOUNTER — Other Ambulatory Visit: Payer: BC Managed Care – PPO | Admitting: Lab

## 2011-11-30 ENCOUNTER — Ambulatory Visit (HOSPITAL_BASED_OUTPATIENT_CLINIC_OR_DEPARTMENT_OTHER): Payer: BC Managed Care – PPO | Admitting: Oncology

## 2011-11-30 ENCOUNTER — Telehealth: Payer: Self-pay | Admitting: Oncology

## 2011-11-30 VITALS — BP 137/111 | HR 132 | Temp 98.3°F | Ht 65.0 in | Wt 190.9 lb

## 2011-11-30 DIAGNOSIS — D6941 Evans syndrome: Secondary | ICD-10-CM

## 2011-11-30 DIAGNOSIS — D693 Immune thrombocytopenic purpura: Secondary | ICD-10-CM

## 2011-11-30 DIAGNOSIS — B370 Candidal stomatitis: Secondary | ICD-10-CM

## 2011-11-30 LAB — CBC WITH DIFFERENTIAL/PLATELET
EOS%: 0 % (ref 0.0–7.0)
HCT: 40.8 % (ref 34.8–46.6)
MCH: 24.9 pg — ABNORMAL LOW (ref 25.1–34.0)
MCHC: 32.4 g/dL (ref 31.5–36.0)
MCV: 77 fL — ABNORMAL LOW (ref 79.5–101.0)
MONO%: 3.7 % (ref 0.0–14.0)
NEUT%: 92.2 % — ABNORMAL HIGH (ref 38.4–76.8)
RDW: 21.4 % — ABNORMAL HIGH (ref 11.2–14.5)
lymph#: 0.6 10*3/uL — ABNORMAL LOW (ref 0.9–3.3)
nRBC: 1 % — ABNORMAL HIGH (ref 0–0)

## 2011-11-30 NOTE — Telephone Encounter (Signed)
gve the pt her march 2013 appt calendar

## 2011-11-30 NOTE — Progress Notes (Signed)
Hematology and Oncology Follow Up Visit  Summer Cook [id number] [birth year] 46 y.o. 11/30/2011 1:08 PM PCP   Principle Diagnosis: Chronic ITP  Interim History:  There have been no intercurrent illness, hospitalizations or medication changes, in general she feels fairly fatigued. She is still trying get her disability organized. Medications: I have reviewed the patient's current medications. She is on a tapering schedule of prednisone at 45 mg a day currently, she is cutting back by 5 mg every 5 days.  Allergies:  Allergies  Allergen Reactions  . Yellow Jacket Venom     Past Medical History, Surgical history, Social history, and Family History were reviewed and updated.  Review of Systems: Constitutional:  Negative for fever, chills, night sweats, anorexia, weight loss, pain. Cardiovascular: no chest pain or dyspnea on exertion Respiratory: no cough, shortness of breath, or wheezing Neurological: no TIA or stroke symptoms Dermatological: negative ENT: negative Skin Gastrointestinal: no abdominal pain, change in bowel habits, or black or bloody stools Genito-Urinary: no dysuria, trouble voiding, or hematuria Hematological and Lymphatic: negative Breast: negative for breast lumps Musculoskeletal: negative Remaining ROS negative.  Physical Exam: Blood pressure 137/111, pulse 132, temperature 98.3 F (36.8 C), height 5\' 5"  (1.651 m), weight 190 lb 14.4 oz (86.592 kg). ECOG: 0 General appearance: alert, cooperative and appears stated age Head: Normocephalic, without obvious abnormality, atraumatic, + thrush Neck: no adenopathy, no carotid bruit, no JVD, supple, symmetrical, trachea midline and thyroid not enlarged, symmetric, no tenderness/mass/nodules Lymph nodes: Cervical, supraclavicular, and axillary nodes normal. Cardiac : regular rate and rhythm, no murmurs or gallops Pulmonary:clear to auscultation bilaterally and normal percussion bilaterally Breasts: inspection  negative, no nipple discharge or bleeding, no masses or nodularity palpable Abdomen:soft, non-tender; bowel sounds normal; no masses,  no organomegaly Extremities negative, acneiform areas on back. Neuro: alert, oriented, normal speech, no focal findings or movement disorder noted  Lab Results: Lab Results  Component Value Date   WBC 14.0* 11/30/2011   HGB 13.2 11/30/2011   HCT 40.8 11/30/2011   MCV 77.0* 11/30/2011   PLT 46* 11/30/2011     Chemistry      Component Value Date/Time   NA 138 10/07/2011 1628   K 3.4* 10/07/2011 1628   CL 101 10/07/2011 1628   CO2 27 10/07/2011 1628   BUN 15 10/07/2011 1628   CREATININE 1.33* 10/07/2011 1628      Component Value Date/Time   CALCIUM 9.3 10/07/2011 1628   ALKPHOS 62 10/07/2011 1628   AST 38* 10/07/2011 1628   ALT 32 10/07/2011 1628   BILITOT 0.4 10/07/2011 1628       Radiological Studies: chest X-ray n/a Mammogram n/a Bone density n/a  Impression and Plan:  The patient returns for followup. She is currently on 45 mg of prednisone daily. She is on 100 mg of Cytoxan daily. She feels great her energy level is poor and she has not had any problems with bleeding or bruising. Her most recent CBC shows a platelet count of approximately 46. We discussed various strategies for dealing with her thrombocytopenia. I recommended for further decreases of her prednisone dose by 10 mg every 5 days, with weekly CBCs. From a prophylaxis point of view I have recommended she take a Septra DS one twice a day Monday Wednesday Friday as well as acyclovir 400 mg daily. She has evidence of thrush on today's exam as well as an acneiform rash on her back. I have recommended that with Diflucan, Magic mouthwash and Cleocin cream for her  back as well. I plan to see her back in approximately a month. She will continue to decrease her prednisone by 5 mg every 5 days. She will continue the high-dose Cytoxan which is what may be making her fatigued. She continues on her other medications. We  discussed the possibility of starting her on romiplastin. If we find that her platelet count continues to drop we may need to hold her prednisone dose at whatever keeps her platelet count stable at about 20,000. I will see her in a month's time  More than 50% of the visit was spent in patient-related counselling   Eston Esters, MD 3/4/20131:08 PM

## 2011-12-04 ENCOUNTER — Encounter: Payer: Self-pay | Admitting: Oncology

## 2011-12-04 ENCOUNTER — Telehealth: Payer: Self-pay | Admitting: *Deleted

## 2011-12-04 NOTE — Progress Notes (Signed)
I spoke with patient today and she had some concern about financial assistance,so i told her that i would mail her an [employer] application ,because she needed help with her prescriptions.

## 2011-12-04 NOTE — Telephone Encounter (Signed)
Pt. Called and is requesting letter from Dr. Truddie Coco for social security disability.  Pt. Would like it mailed to her.  She is filing for this next week.  The letter needs to be specific for her medications and variability of her platelets.  Call back number is [phone].

## 2011-12-08 ENCOUNTER — Telehealth: Payer: Self-pay | Admitting: *Deleted

## 2011-12-08 NOTE — Telephone Encounter (Signed)
Received request for diabetes testing supplies from the [employer] [facility].   Called pt. And asked her have her PCP fill this request.  She will call the [facility] and ask them to send it to her PCP- Dr. Hilma Favors. Pt. Also requests that Dr. Truddie Coco refer her for a 2nd opinion to [facility] - Dr. Randa Spike.  Will let Dr. Truddie Coco know.

## 2011-12-12 ENCOUNTER — Emergency Department (HOSPITAL_COMMUNITY)
Admission: EM | Admit: 2011-12-12 | Discharge: 2011-12-12 | Discharge status: Against Medical Advice | Payer: BC Managed Care – PPO | Attending: Emergency Medicine | Admitting: Emergency Medicine

## 2011-12-12 ENCOUNTER — Encounter (HOSPITAL_COMMUNITY): Payer: Self-pay

## 2011-12-12 DIAGNOSIS — R509 Fever, unspecified: Secondary | ICD-10-CM | POA: Insufficient documentation

## 2011-12-12 NOTE — ED Notes (Signed)
Pt presents with fever of 103, chills, and sweating x 3 days. Temp in triage 98.9.

## 2011-12-12 NOTE — ED Notes (Signed)
Pt refusing to stay to be seen. AMA form signed by pt. Pt ambulated to lobby with steady gate.

## 2011-12-16 ENCOUNTER — Telehealth: Payer: Self-pay | Admitting: *Deleted

## 2011-12-16 NOTE — Telephone Encounter (Signed)
Pt. Called late Tuesday to say that she had gone to the [facility] ED on Saturday with a temp of 103 and a sinus headache, however she did not want to wait and therefore did not stay.   She is feeling better today and denies any fever.  Her head congestion is better, She still has some bloody discharge when she blows her nose.  Discussed with Dr. Truddie Coco.  Pt. Is due to have her weekly lab today at [facility].  She needs to go ahead and get this lab work and have them send it to [place] and we can see what her platelets are.

## 2011-12-17 ENCOUNTER — Encounter: Payer: Self-pay | Admitting: Oncology

## 2011-12-17 NOTE — Progress Notes (Signed)
Put disability paper on nurse's desk.

## 2011-12-18 ENCOUNTER — Emergency Department (HOSPITAL_COMMUNITY): Payer: BC Managed Care – PPO

## 2011-12-18 ENCOUNTER — Inpatient Hospital Stay (HOSPITAL_COMMUNITY)
Admission: EM | Admit: 2011-12-18 | Discharge: 2011-12-20 | DRG: 397 | Disposition: A | Discharge status: Home / Self Care | Payer: BC Managed Care – PPO | Attending: Internal Medicine | Admitting: Internal Medicine

## 2011-12-18 ENCOUNTER — Encounter (HOSPITAL_COMMUNITY): Payer: Self-pay | Admitting: Emergency Medicine

## 2011-12-18 DIAGNOSIS — N289 Disorder of kidney and ureter, unspecified: Secondary | ICD-10-CM | POA: Diagnosis present

## 2011-12-18 DIAGNOSIS — IMO0002 Reserved for concepts with insufficient information to code with codable children: Secondary | ICD-10-CM

## 2011-12-18 DIAGNOSIS — F3289 Other specified depressive episodes: Secondary | ICD-10-CM | POA: Diagnosis present

## 2011-12-18 DIAGNOSIS — I10 Essential (primary) hypertension: Secondary | ICD-10-CM | POA: Diagnosis present

## 2011-12-18 DIAGNOSIS — E876 Hypokalemia: Secondary | ICD-10-CM | POA: Diagnosis present

## 2011-12-18 DIAGNOSIS — D693 Immune thrombocytopenic purpura: Secondary | ICD-10-CM | POA: Diagnosis present

## 2011-12-18 DIAGNOSIS — G4700 Insomnia, unspecified: Secondary | ICD-10-CM

## 2011-12-18 DIAGNOSIS — R51 Headache: Secondary | ICD-10-CM | POA: Diagnosis present

## 2011-12-18 DIAGNOSIS — R7989 Other specified abnormal findings of blood chemistry: Secondary | ICD-10-CM | POA: Diagnosis present

## 2011-12-18 DIAGNOSIS — B370 Candidal stomatitis: Secondary | ICD-10-CM | POA: Diagnosis present

## 2011-12-18 DIAGNOSIS — F329 Major depressive disorder, single episode, unspecified: Secondary | ICD-10-CM | POA: Diagnosis present

## 2011-12-18 DIAGNOSIS — R519 Headache, unspecified: Secondary | ICD-10-CM | POA: Diagnosis present

## 2011-12-18 LAB — CBC
Hemoglobin: 12.1 g/dL (ref 12.0–15.0)
MCH: 25.6 pg — ABNORMAL LOW (ref 26.0–34.0)
MCHC: 31 g/dL (ref 30.0–36.0)
MCV: 82.5 fL (ref 78.0–100.0)
RBC: 4.73 MIL/uL (ref 3.87–5.11)

## 2011-12-18 LAB — COMPREHENSIVE METABOLIC PANEL
ALT: 84 U/L — ABNORMAL HIGH (ref 0–35)
BUN: 14 mg/dL (ref 6–23)
CO2: 29 mEq/L (ref 19–32)
Calcium: 9.3 mg/dL (ref 8.4–10.5)
Creatinine, Ser: 1.29 mg/dL — ABNORMAL HIGH (ref 0.50–1.10)
GFR calc Af Amer: 57 mL/min — ABNORMAL LOW (ref >90)
GFR calc non Af Amer: 49 mL/min — ABNORMAL LOW (ref >90)
Glucose, Bld: 104 mg/dL — ABNORMAL HIGH (ref 70–99)
Sodium: 139 mEq/L (ref 135–145)
Total Protein: 5.9 g/dL — ABNORMAL LOW (ref 6.0–8.3)

## 2011-12-18 LAB — DIFFERENTIAL
Basophils Relative: 1 % (ref 0–1)
Eosinophils Absolute: 0 10*3/uL (ref 0.0–0.7)
Eosinophils Relative: 0 % (ref 0–5)
Lymphs Abs: 1.6 10*3/uL (ref 0.7–4.0)
Monocytes Absolute: 0.4 10*3/uL (ref 0.1–1.0)
Monocytes Relative: 9 % (ref 3–12)
Neutrophils Relative %: 57 % (ref 43–77)

## 2011-12-18 MED ORDER — FUROSEMIDE 20 MG PO TABS
20.0000 mg | ORAL_TABLET | Freq: Every day | ORAL | Status: DC
  Administered 2011-12-18 – 2011-12-20 (×3): 20 mg via ORAL
  Filled 2011-12-18 (×3): qty 1

## 2011-12-18 MED ORDER — SODIUM CHLORIDE 0.9 % IV SOLN
20.0000 mg | Freq: Two times a day (BID) | INTRAVENOUS | Status: DC
  Filled 2011-12-18 (×2): qty 5

## 2011-12-18 MED ORDER — SULFAMETHOXAZOLE-TMP DS 800-160 MG PO TABS
1.0000 | ORAL_TABLET | ORAL | Status: DC
  Administered 2011-12-18: 1 via ORAL
  Filled 2011-12-18: qty 1

## 2011-12-18 MED ORDER — DIPHENHYDRAMINE HCL 50 MG/ML IJ SOLN
25.0000 mg | Freq: Once | INTRAMUSCULAR | Status: AC
  Administered 2011-12-18: 25 mg via INTRAVENOUS
  Filled 2011-12-18: qty 1

## 2011-12-18 MED ORDER — MORPHINE SULFATE 2 MG/ML IJ SOLN: 1.0000 mg | INTRAMUSCULAR | Status: DC | PRN

## 2011-12-18 MED ORDER — ALPRAZOLAM 0.5 MG PO TABS
0.5000 mg | ORAL_TABLET | Freq: Every evening | ORAL | Status: DC | PRN
  Administered 2011-12-19: 0.5 mg via ORAL
  Filled 2011-12-18: qty 1

## 2011-12-18 MED ORDER — ONDANSETRON HCL 4 MG PO TABS: 4.0000 mg | ORAL_TABLET | Freq: Four times a day (QID) | ORAL | Status: DC | PRN

## 2011-12-18 MED ORDER — POTASSIUM CHLORIDE CRYS ER 10 MEQ PO TBCR
10.0000 meq | EXTENDED_RELEASE_TABLET | Freq: Every day | ORAL | Status: DC
  Administered 2011-12-18 – 2011-12-20 (×3): 10 meq via ORAL
  Filled 2011-12-18 (×3): qty 1

## 2011-12-18 MED ORDER — ONDANSETRON HCL 4 MG/2ML IJ SOLN: 4.0000 mg | Freq: Four times a day (QID) | INTRAMUSCULAR | Status: DC | PRN

## 2011-12-18 MED ORDER — HYDROMORPHONE HCL PF 1 MG/ML IJ SOLN
1.0000 mg | Freq: Once | INTRAMUSCULAR | Status: AC
  Administered 2011-12-18: 1 mg via INTRAVENOUS
  Filled 2011-12-18: qty 1

## 2011-12-18 MED ORDER — ONDANSETRON HCL 4 MG/2ML IJ SOLN
4.0000 mg | Freq: Once | INTRAMUSCULAR | Status: AC
  Administered 2011-12-18: 4 mg via INTRAVENOUS
  Filled 2011-12-18: qty 2

## 2011-12-18 MED ORDER — POTASSIUM CHLORIDE CRYS ER 20 MEQ PO TBCR
60.0000 meq | EXTENDED_RELEASE_TABLET | Freq: Once | ORAL | Status: AC
  Administered 2011-12-18: 60 meq via ORAL
  Filled 2011-12-18: qty 3

## 2011-12-18 MED ORDER — SERTRALINE HCL 50 MG PO TABS
150.0000 mg | ORAL_TABLET | Freq: Every day | ORAL | Status: DC
  Administered 2011-12-19 – 2011-12-20 (×2): 150 mg via ORAL
  Filled 2011-12-18 (×2): qty 1

## 2011-12-18 MED ORDER — FOLIC ACID 1 MG PO TABS
1.0000 mg | ORAL_TABLET | Freq: Every day | ORAL | Status: DC
  Administered 2011-12-19 – 2011-12-20 (×2): 1 mg via ORAL
  Filled 2011-12-18 (×2): qty 1

## 2011-12-18 MED ORDER — MAGIC MOUTHWASH
15.0000 mL | Freq: Three times a day (TID) | ORAL | Status: DC
  Administered 2011-12-18 – 2011-12-19 (×3): 15 mL via ORAL
  Filled 2011-12-18 (×7): qty 15

## 2011-12-18 MED ORDER — SODIUM CHLORIDE 0.9 % IV BOLUS (SEPSIS)
1000.0000 mL | Freq: Once | INTRAVENOUS | Status: AC
  Administered 2011-12-18: 1000 mL via INTRAVENOUS

## 2011-12-18 MED ORDER — DEXAMETHASONE SODIUM PHOSPHATE 4 MG/ML IJ SOLN
20.0000 mg | Freq: Two times a day (BID) | INTRAMUSCULAR | Status: DC
  Administered 2011-12-18 – 2011-12-19 (×2): 20 mg via INTRAVENOUS
  Filled 2011-12-18 (×3): qty 5

## 2011-12-18 MED ORDER — ALBUTEROL SULFATE HFA 108 (90 BASE) MCG/ACT IN AERS
1.0000 | INHALATION_SPRAY | RESPIRATORY_TRACT | Status: DC | PRN
  Filled 2011-12-18: qty 6.7

## 2011-12-18 MED ORDER — FLUCONAZOLE 100 MG PO TABS
100.0000 mg | ORAL_TABLET | Freq: Every day | ORAL | Status: DC
  Administered 2011-12-19 – 2011-12-20 (×2): 100 mg via ORAL
  Filled 2011-12-18 (×2): qty 1

## 2011-12-18 MED ORDER — SULFAMETHOXAZOLE-TMP DS 800-160 MG PO TABS: 1.0000 | ORAL_TABLET | ORAL | Status: DC

## 2011-12-18 MED ORDER — HYDROCODONE-ACETAMINOPHEN 5-325 MG PO TABS
1.0000 | ORAL_TABLET | ORAL | Status: DC | PRN
  Administered 2011-12-19 (×4): 1 via ORAL
  Filled 2011-12-18 (×4): qty 1

## 2011-12-18 MED ORDER — ACETAMINOPHEN 500 MG PO TABS
1000.0000 mg | ORAL_TABLET | Freq: Four times a day (QID) | ORAL | Status: DC | PRN
  Administered 2011-12-18: 1000 mg via ORAL
  Filled 2011-12-18: qty 2

## 2011-12-18 MED ORDER — SODIUM CHLORIDE 0.9 % IV SOLN: 20.0000 mg | Freq: Two times a day (BID) | INTRAVENOUS | Status: DC

## 2011-12-18 MED ORDER — SODIUM CHLORIDE 0.9 % IV SOLN: 1.0000 mg/h | Freq: Once | INTRAVENOUS | Status: DC

## 2011-12-18 MED ORDER — ACYCLOVIR 400 MG PO TABS
400.0000 mg | ORAL_TABLET | Freq: Every day | ORAL | Status: DC
  Administered 2011-12-19 – 2011-12-20 (×2): 400 mg via ORAL
  Filled 2011-12-18 (×2): qty 1

## 2011-12-18 MED ORDER — SODIUM CHLORIDE 0.9 % IV SOLN
10.0000 mg | Freq: Four times a day (QID) | INTRAVENOUS | Status: DC
  Filled 2011-12-18 (×3): qty 2.5

## 2011-12-18 MED ORDER — POTASSIUM CHLORIDE 10 MEQ/100ML IV SOLN
10.0000 meq | INTRAVENOUS | Status: AC
  Administered 2011-12-18 (×2): 10 meq via INTRAVENOUS
  Filled 2011-12-18 (×2): qty 100

## 2011-12-18 NOTE — ED Notes (Signed)
Patient ambulated to restroom with no complaints

## 2011-12-18 NOTE — ED Notes (Signed)
[facility] called, gave report to [employer]

## 2011-12-18 NOTE — ED Provider Notes (Signed)
This chart was scribed for [employer]. Olin Hauser, MD by Zella Ball. The patient was seen in room APA12/[facility]12 at 8:10 AM.  CSN: [id number]  Arrival date & time 12/18/11  [id number]   First MD Initiated Contact with Patient 12/18/11 [phone]      Chief Complaint  Patient presents with  . Headache    pt states called with critical lab of low platelets    (Consider location/radiation/quality/duration/timing/severity/associated sxs/prior treatment) Patient is a 46 y.o. female presenting with headaches. The history is provided by the patient.  Headache  This is a new problem. The current episode started more than 2 days ago. The problem occurs constantly. The problem has not changed since onset.The headache is associated with an unknown factor. The pain is located in the occipital region. The pain is at a severity of 8/10. The pain is severe. The pain radiates to the left neck and right neck. Associated symptoms include nausea. Pertinent negatives include no fever. She has tried acetaminophen and oral narcotic analgesics for the symptoms. The treatment provided moderate relief.   AMARYS VENTERS is a 46 y.o. female with a history of chronic ITPwho presents to the Emergency Department complaining of an acute onset continuous headache for about a week. Pt has been treating with tylenol and hydrocodone with improvement. Pt is currently out of prescription pain medication. Pt has generalized headache with some sensitivity to light. Vision is fine but has been getting dizzy. Family reports that pt had reported some fogginess over her left eye. Pt has had congestion with bloody nasal discharge since last Friday. Pt had abnormal wbc when checked yesterday. 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 Some associated nausea. Headache is currently a 8/10. Past Medical History  Diagnosis Date  . Hypertension   . ITP (idiopathic thrombocytopenic purpura)   . Platelets decreased   . Depression   . Thrush, oral 05/29/2011  . Evan's syndrome      Past Surgical History  Procedure Date  . Cholecystectomy   . Bone marrow biopsy   . Splenectomy, total     Family History  Problem Relation Age of Onset  . Cancer Mother   . Cervical cancer Mother   . Cancer Father   . Lung cancer Father   . Pneumonia Brother     History  Substance Use Topics  . Smoking status: Former Smoker    Quit date: 02/15/2011  . Smokeless tobacco: Never Used  . Alcohol Use: No    OB History    Grav Para Term Preterm Abortions TAB SAB Ect Mult Living                  Review of Systems  Constitutional: Negative for fever.  Gastrointestinal: Positive for nausea.  Neurological: Positive for headaches.   10 Systems reviewed and are negative for acute change except as noted in the HPI.  Allergies  Yellow jacket venom  Home Medications   Current Outpatient Rx  Name Route Sig Dispense Refill  . ACETAMINOPHEN ER 650 MG PO TBCR Oral Take 1,950 mg by mouth as needed. For pain     . ACYCLOVIR 400 MG PO TABS Oral Take 400 mg by mouth Daily.    . ALBUTEROL SULFATE HFA 108 (90 BASE) MCG/ACT IN AERS Inhalation Inhale into the lungs every 4 (four) hours as needed.    . ALPRAZOLAM 0.5 MG PO TABS  Take one - two tablets, PO, at HS 45 tablet 0  . MAGIC MOUTHWASH Oral Take 15 mLs by mouth  3 (three) times daily. 250 mL 6    Swish and spit  . BAYER CONTOUR TEST VI STRP  Daily.    . CYCLOPHOSPHAMIDE 50 MG PO TABS Oral Take 1 tablet (50 mg total) by mouth daily. Give on an empty stomach 1 hour before or 2 hours after meals. 60 tablet 3  . FLUCONAZOLE 100 MG PO TABS Oral Take 100 mg by mouth Daily.    Marland Kitchen FOLIC ACID 1 MG PO TABS Oral Take 1 mg by mouth daily.      . FUROSEMIDE 20 MG PO TABS Oral Take 20 mg by mouth Daily.    Marland Kitchen HYDROCODONE-ACETAMINOPHEN 5-325 MG PO TABS Oral Take 1 tablet by mouth every 4 (four) hours as needed. One every 4-6 hrs prn pain    . POTASSIUM CHLORIDE CRYS ER 20 MEQ PO TBCR Oral Take 20 mg by mouth Daily.    Marland Kitchen PREDNISONE 20 MG PO  TABS Oral Take 45 mg by mouth. Taper as directed    . PROCHLORPERAZINE MALEATE 10 MG PO TABS Oral Take 10 mg by mouth as needed. For nausea     . SERTRALINE HCL 50 MG PO TABS Oral Take 150 mg by mouth daily.     . SULFAMETHOXAZOLE-TMP DS 800-160 MG PO TABS Oral Take 800 mg by mouth Twice daily. Take on m-w-f    . TRIAMTERENE-HCTZ 37.5-25 MG PO CAPS Oral Take 1 capsule by mouth every morning.       BP 119/91  Pulse 124  Temp(Src) 98.3 F (36.8 C) (Oral)  Resp 20  Ht 5\' 5"  (1.651 m)  Wt 191 lb (86.637 kg)  BMI 31.78 kg/m2  SpO2 96%  LMP 05/13/2011  Physical Exam  Nursing note and vitals reviewed. Constitutional: She is oriented to person, place, and time. She appears well-developed and well-nourished.       obese  HENT:  Head: Normocephalic and atraumatic.       Tenderness at the base of neck and occiput with palpation.  "moon facies" from prednisone   Neck: Normal range of motion. Neck supple. No JVD present.       No bruit   Cardiovascular: Regular rhythm and normal heart sounds.  Tachycardia present.   Pulmonary/Chest: Effort normal and breath sounds normal. No respiratory distress.       Lungs clear  Abdominal: Soft. There is no tenderness.  Musculoskeletal: Normal range of motion. She exhibits no edema.       no peripheral edema  Neurological: She is alert and oriented to person, place, and time.  Skin: Skin is warm and dry.       Several dry skin patches with excoriation to the lower extremities  Psychiatric: She has a normal mood and affect. Her behavior is normal.    ED Course  Procedures (including critical care time) DIAGNOSTIC STUDIES: Oxygen Saturation is 96% on room air, normal by my interpretation.    COORDINATION OF CARE: Medications - No data to display  11:53 AM Consult with oncologist for admission of pt  Results for orders placed during the hospital encounter of 12/18/11  CBC      Component Value Range   WBC 4.8  4.0 - 10.5 (K/uL)   RBC 4.73   3.87 - 5.11 (MIL/uL)   Hemoglobin 12.1  12.0 - 15.0 (g/dL)   HCT 39.0  36.0 - 46.0 (%)   MCV 82.5  78.0 - 100.0 (fL)   MCH 25.6 (*) 26.0 - 34.0 (pg)   MCHC  31.0  30.0 - 36.0 (g/dL)   RDW 21.8 (*) 11.5 - 15.5 (%)   Platelets 7 (*) 150 - 400 (K/uL)  DIFFERENTIAL      Component Value Range   Neutrophils Relative 57  43 - 77 (%)   Neutro Abs 2.8  1.7 - 7.7 (K/uL)   Lymphocytes Relative 33  12 - 46 (%)   Lymphs Abs 1.6  0.7 - 4.0 (K/uL)   Monocytes Relative 9  3 - 12 (%)   Monocytes Absolute 0.4  0.1 - 1.0 (K/uL)   Eosinophils Relative 0  0 - 5 (%)   Eosinophils Absolute 0.0  0.0 - 0.7 (K/uL)   Basophils Relative 1  0 - 1 (%)   Basophils Absolute 0.0  0.0 - 0.1 (K/uL)   Smear Review PLATELETS APPEAR DECREASED    COMPREHENSIVE METABOLIC PANEL      Component Value Range   Sodium 139  135 - 145 (mEq/L)   Potassium 3.0 (*) 3.5 - 5.1 (mEq/L)   Chloride 101  96 - 112 (mEq/L)   CO2 29  19 - 32 (mEq/L)   Glucose, Bld 104 (*) 70 - 99 (mg/dL)   BUN 14  6 - 23 (mg/dL)   Creatinine, Ser 1.29 (*) 0.50 - 1.10 (mg/dL)   Calcium 9.3  8.4 - 10.5 (mg/dL)   Total Protein 5.9 (*) 6.0 - 8.3 (g/dL)   Albumin 3.0 (*) 3.5 - 5.2 (g/dL)   AST 57 (*) 0 - 37 (U/L)   ALT 84 (*) 0 - 35 (U/L)   Alkaline Phosphatase 67  39 - 117 (U/L)   Total Bilirubin 0.3  0.3 - 1.2 (mg/dL)   GFR calc non Af Amer 49 (*) >90 (mL/min)   GFR calc Af Amer 57 (*) >90 (mL/min)   Ct Head Wo Contrast  12/18/2011  *RADIOLOGY REPORT*  Clinical Data:  Severe headache for 2 weeks.  No injury. History of hypertension.  History of ITP.  CT HEAD WITHOUT CONTRAST  Technique:  Contiguous axial images were obtained from the base of the skull through the vertex without contrast  Comparison:  12/19/2010  Findings:  The brain has a normal appearance without evidence for hemorrhage, acute infarction, hydrocephalus, or mass lesion.  There is no extra axial fluid collection.  The skull and paranasal sinuses are normal. No change from priors.   IMPRESSION: Normal CT of the head without contrast.  Original Report Authenticated By: Staci Righter, M.D.  12:48 PM Consult with Dr. Truddie Coco, hospitalist at [facility]. He recommended admission and treatment with decadron 20 mg BID. 12:49 PM recheck pt notified of lab results and scans. Pt to be admitted at [facility] 12:49 :  T/C to Dr. Doyle Askew, case discussed, including:  HPI, pertinent PM/SHx, VS/PE, dx testing, ED course and treatment.  Agreeable to acceptance ito transfer to [facility] for admission.  Requests to write temporary orders, med surg bed to team1.   MDM  Patient with ITP who presents with a headache x 4 days.headache is consistent with a tension headache, with no focal neurologic findings. Given IV fluids, antibiotics, analgesics x2, with improvement in the headache.CT of her head is negative for acute process.blood work reveals a significant thrombocytopenia.consultation with oncologist, Dr. Truddie Coco, and recommendation for hospitalization and treatment with Decadron. Spoke with the hospitalist at [facility], Dr. Doyle Askew, who has accepted the patient in transfer to that facility for admission to Med Surg on team 1. Pt stable in ED with no significant  deterioration in condition. The patient appears reasonably stabilized for transfer considering the current resources, flow, and capabilities available in the ED at this time, and I doubt any other [facility] requiring further screening and/or treatment in the ED prior to transfer.  I personally performed the services described in this documentation, which was scribed in my presence. The recorded information has been reviewed and considered.   MDM Reviewed: nursing note and vitals Reviewed previous: labs Interpretation: labs and CT scan Consults: hospitalist, oncologist.         Gypsy Balsam. Olin Hauser, MD 12/18/11 1331

## 2011-12-18 NOTE — H&P (Signed)
PCP:   [name], MD, MD   Chief Complaint:  Headache/ thrombocytopenia  HPI: 46 year old lady with a known h/o ITP, on chronic steroids, who also just got off steroids about a week ago, s/p spleenectomy was asked to come to ER for a platelet count of 7000, from the lab test done  Yesterday. Today she also complained of a headache, reports its similar to the migraine headache that she gets, which did n't get better with pain medication. She denies any other complaints.   Review of Systems:  The patient denies anorexia, fever, weight loss,, vision loss, decreased hearing, hoarseness, chest pain, syncope, dyspnea on exertion, peripheral edema, balance deficits, hemoptysis, abdominal pain, melena, hematochezia, severe indigestion/heartburn, hematuria, incontinence, genital sores, muscle weakness,  difficulty walking, depression, unusual weight change, abnormal bleeding, enlarged lymph nodes, angioedema, and breast masses.  Past Medical History: Past Medical History  Diagnosis Date  . Hypertension   . ITP (idiopathic thrombocytopenic purpura)   . Platelets decreased     Received Cytoxan PO. Last dose 12/11/11  . Depression   . Thrush, oral 05/29/2011  . Evan's syndrome    Past Surgical History  Procedure Date  . Cholecystectomy   . Bone marrow biopsy   . Splenectomy, total     Medications: Prior to Admission medications   Medication Sig Start Date End Date Taking? Authorizing Provider  acetaminophen (TYLENOL) 650 MG CR tablet Take 1,950 mg by mouth as needed. For pain    Yes Historical Provider, MD  acyclovir (ZOVIRAX) 400 MG tablet Take 400 mg by mouth Daily. 11/09/11  Yes Eston Esters, MD  albuterol (PROVENTIL HFA;VENTOLIN HFA) 108 (90 BASE) MCG/ACT inhaler Inhale into the lungs every 4 (four) hours as needed.   Yes Historical Provider, MD  ALPRAZolam Duanne Moron) 0.5 MG tablet Take one - two tablets, PO, at HS 04/21/11  Yes Baird Cancer, PA  Alum & Mag Hydroxide-Simeth (MAGIC  MOUTHWASH) SOLN Take 15 mLs by mouth 3 (three) times daily. 11/09/11  Yes Eston Esters, MD  BAYER CONTOUR TEST test strip Daily. 11/20/11  Yes Historical Provider, MD  cyclophosphamide (CYTOXAN) 50 MG tablet Take 1 tablet (50 mg total) by mouth daily. Give on an empty stomach 1 hour before or 2 hours after meals. 10/07/11  Yes Eston Esters, MD  fluconazole (DIFLUCAN) 100 MG tablet Take 100 mg by mouth Daily. 11/09/11  Yes Eston Esters, MD  folic acid (FOLVITE) 1 MG tablet Take 1 mg by mouth daily.     Yes Historical Provider, MD  furosemide (LASIX) 20 MG tablet Take 20 mg by mouth Daily. 11/17/11  Yes Lawrence J. Fusco, MD  HYDROcodone-acetaminophen (NORCO) 5-325 MG per tablet Take 1 tablet by mouth every 4 (four) hours as needed. One every 4-6 hrs prn pain   Yes Historical Provider, MD  potassium chloride SA (K-DUR,KLOR-CON) 20 MEQ tablet Take 20 mg by mouth Daily. 11/17/11  Yes Lawrence J. Fusco, MD  predniSONE (DELTASONE) 20 MG tablet Take 45 mg by mouth. Taper as directed 10/15/11  Yes Historical Provider, MD  sertraline (ZOLOFT) 50 MG tablet Take 150 mg by mouth daily.    Yes Historical Provider, MD  sulfamethoxazole-trimethoprim (BACTRIM DS) 800-160 MG per tablet Take 800 mg by mouth Twice daily. Take on m-w-f 11/09/11  Yes Eston Esters, MD  triamterene-hydrochlorothiazide (DYAZIDE) 37.5-25 MG per capsule Take 1 capsule by mouth every morning.    Yes Historical Provider, MD    Allergies:   Allergies  Allergen Reactions  .  Yellow Jacket Venom     Social History:  reports that she quit smoking about 10 months ago. She has never used smokeless tobacco. She reports that she does not drink alcohol or use illicit drugs.   Family History: Family History  Problem Relation Age of Onset  . Cancer Mother   . Cervical cancer Mother   . Cancer Father   . Lung cancer Father   . Pneumonia Brother     Physical Exam: Filed Vitals:   12/18/11 0732 12/18/11 0947 12/18/11 1229 12/18/11 1550  BP: 119/91  135/95 117/72 122/92  Pulse: 124 109 110 120  Temp: 98.3 F (36.8 C)  98.1 F (36.7 C) 98.2 F (36.8 C)  TempSrc: Oral  Oral Oral  Resp: 20 20 20 18   Height: 5\' 5"  (1.651 m)   5\' 5"  (1.651 m)  Weight: 86.637 kg (191 lb)   87.862 kg (193 lb 11.2 oz)  SpO2: 96% 95% 95% 98%   Constitutional: Vital signs reviewed.  Patient is a well-developed and well-nourished  in no acute distress and cooperative with exam. Alert and oriented x3.  Head: Normocephalic and atraumatic Mouth: no erythema or exudates, MMM Eyes: PERRL, EOMI, conjunctivae normal, No scleral icterus.  Neck: Supple, Trachea midline normal ROM, No JVD, mass, thyromegaly, or carotid bruit present.  Cardiovascular: RRR, S1 normal, S2 normal, no MRG, pulses symmetric and intact bilaterally Pulmonary/Chest: CTAB, no wheezes, rales, or rhonchi Abdominal: Soft. Non-tender, non-distended, bowel sounds are normal, no masses, organomegaly, or guarding present.  GU: no CVA tenderness Musculoskeletal: No joint deformities, erythema, or stiffness, ROM full and non tender. An area of bruising over the right arm.  Hematology: no cervical, inginal, or axillary adenopathy.  Neurological: A&O x3, Strenght is normal and symmetric bilaterally, cranial nerve II-XII are grossly intact, no focal motor deficit, sensory intact to light touch bilaterally.  Skin: Warm, dry and intact. No rash, cyanosis, or clubbing.  Psychiatric: Normal mood and affect. speech and behavior is normal.    Labs on Admission:   [facility] 12/18/11 0805  NA 139  K 3.0*  CL 101  CO2 29  GLUCOSE 104*  BUN 14  CREATININE 1.29*  CALCIUM 9.3  MG --  PHOS --    Basename 12/18/11 0805  AST 57*  ALT 84*  ALKPHOS 67  BILITOT 0.3  PROT 5.9*  ALBUMIN 3.0*   No results found for this basename: LIPASE:2,AMYLASE:2 in the last 72 hours  Basename 12/18/11 0805  WBC 4.8  NEUTROABS 2.8  HGB 12.1  HCT 39.0  MCV 82.5  PLT 7*   No results found for this basename:  CKTOTAL:3,CKMB:3,CKMBINDEX:3,TROPONINI:3 in the last 72 hours No results found for this basename: TSH,T4TOTAL,FREET3,T3FREE,THYROIDAB in the last 72 hours No results found for this basename: VITAMINB12:2,FOLATE:2,FERRITIN:2,TIBC:2,IRON:2,RETICCTPCT:2 in the last 72 hours  Radiological Exams on Admission: Ct Head Wo Contrast  12/18/2011  *RADIOLOGY REPORT*  Clinical Data:  Severe headache for 2 weeks.  No injury. History of hypertension.  History of ITP.  CT HEAD WITHOUT CONTRAST  Technique:  Contiguous axial images were obtained from the base of the skull through the vertex without contrast  Comparison:  12/19/2010  Findings:  The brain has a normal appearance without evidence for hemorrhage, acute infarction, hydrocephalus, or mass lesion.  There is no extra axial fluid collection.  The skull and paranasal sinuses are normal. No change from priors.  IMPRESSION: Normal CT of the head without contrast.  Original Report Authenticated By: Staci Righter, M.D.  Assessment/Plan Present on Admission:  .1.ITP (idiopathic thrombocytopenic purpura): with a platelet count of 7000. Will start the patient on iv decadron 20mg  bid. And called hematology consult.  2.Thrush: on diflucan.   3.Headache: pain control with morphine and tylenol. CT HEAD done which is normal. 4.Hypokalemia: will be repleted as needed. 5. Elevated liver function tests: asymptomatic. Will get an ultrasound of he abdomen. 6. Mild renal insufficiency:  Gentle hydration  And will continue to monitor.  7. DVT prophylaxis: scd's   Time spent on this patient including examination and decision-making process: 61 minutes.  Gemini Beaumier [id number] 12/18/2011, 6:16 PM

## 2011-12-18 NOTE — Consult Note (Signed)
Reason for Referral: Thrombocytopenia    Chief Complaint  Patient presents with  . Headache    pt states called with critical lab of low platelets  :  HPI:  46 year old women patient of Dr. Truddie Coco with a known h/o ITP, on chronic steroids and cytoxan, who also just got off steroids about a week ago  And also stopped cytoxan (100 mg daily). 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 She was asked to come to ER for a platelet count of 7000, from the lab test done Yesterday. Today she also complained of a headache, reports its similar to the migraine headache that she gets, which did n't get better with pain medication. She denies any other complaints. CT scan of the head showed no bleeding.   Patient is not reporting any bleeding at this time.    Past Medical History  Diagnosis Date  . Hypertension   . ITP (idiopathic thrombocytopenic purpura)   . Platelets decreased     Received Cytoxan PO. Last dose 12/11/11  . Depression   . Thrush, oral 05/29/2011  . Evan's syndrome   :  Past Surgical History  Procedure Date  . Cholecystectomy   . Bone marrow biopsy   . Splenectomy, total   :  Current facility-administered medications:acetaminophen (TYLENOL) tablet 1,000 mg, 1,000 mg, Oral, Q6H PRN, Hosie Poisson, MD, 1,000 mg at 12/18/11 1833;  acyclovir (ZOVIRAX) tablet 400 mg, 400 mg, Oral, Daily, Hosie Poisson, MD;  albuterol (PROVENTIL HFA;VENTOLIN HFA) 108 (90 BASE) MCG/ACT inhaler 1-2 puff, 1-2 puff, Inhalation, Q4H PRN, Hosie Poisson, MD;  ALPRAZolam Duanne Moron) tablet 0.5 mg, 0.5 mg, Oral, QHS PRN, Hosie Poisson, MD dexamethasone (DECADRON) 20 mg in sodium chloride 0.9 % 50 mL IVPB, 20 mg, Intravenous, BID, Hosie Poisson, MD;  diphenhydrAMINE (BENADRYL) injection 25 mg, 25 mg, Intravenous, Once, [employer]. Olin Hauser, MD, 25 mg at 12/18/11 0934;  fluconazole (DIFLUCAN) tablet 100 mg, 100 mg, Oral, Daily, Hosie Poisson, MD;  folic acid (FOLVITE) tablet 1 mg, 1 mg, Oral, Daily, Hosie Poisson,  MD furosemide (LASIX) tablet 20 mg, 20 mg, Oral, Daily, Hosie Poisson, MD, 20 mg at 12/18/11 1826;  HYDROcodone-acetaminophen (NORCO) 5-325 MG per tablet 1 tablet, 1 tablet, Oral, Q4H PRN, Hosie Poisson, MD;  HYDROmorphone (DILAUDID) injection 1 mg, 1 mg, Intravenous, Once, [employer]. Olin Hauser, MD, 1 mg at 12/18/11 0935;  HYDROmorphone (DILAUDID) injection 1 mg, 1 mg, Intravenous, Once, [employer]. Olin Hauser, MD, 1 mg at 12/18/11 1217 HYDROmorphone (DILAUDID) injection 1 mg, 1 mg, Intravenous, Once, Hosie Poisson, MD, 1 mg at 12/18/11 1652;  magic mouthwash, 15 mL, Oral, TID, Hosie Poisson, MD;  morphine 2 MG/ML injection 1 mg, 1 mg, Intravenous, Q4H PRN, Hosie Poisson, MD;  ondansetron (ZOFRAN) injection 4 mg, 4 mg, Intravenous, Once, Gypsy Balsam. Olin Hauser, MD, 4 mg at 12/18/11 0931;  ondansetron (ZOFRAN) injection 4 mg, 4 mg, Intravenous, Q6H PRN, Hosie Poisson, MD ondansetron (ZOFRAN) tablet 4 mg, 4 mg, Oral, Q6H PRN, Hosie Poisson, MD;  potassium chloride (K-DUR,KLOR-CON) CR tablet 10 mEq, 10 mEq, Oral, Daily, Hosie Poisson, MD, 10 mEq at 12/18/11 1826;  potassium chloride 10 mEq in 100 mL IVPB, 10 mEq, Intravenous, Q1 Hr x 2, Hosie Poisson, MD, 10 mEq at 12/18/11 1834;  potassium chloride SA (K-DUR,KLOR-CON) CR tablet 60 mEq, 60 mEq, Oral, Once, [employer]. Olin Hauser, MD, 60 mEq at 12/18/11 1206 sertraline (ZOLOFT) tablet 150 mg, 150 mg, Oral, Daily, Hosie Poisson, MD;  sodium chloride 0.9 % bolus  1,000 mL, 1,000 mL, Intravenous, Once, [employer]. Olin Hauser, MD, 1,000 mL at 12/18/11 [id number];  sulfamethoxazole-trimethoprim (BACTRIM DS) 800-160 MG per tablet 1 tablet, 1 tablet, Oral, Custom, Hosie Poisson, MD;  DISCONTD: dexamethasone (DECADRON) 10 mg in sodium chloride 0.9 % 50 mL IVPB, 10 mg, Intravenous, Q6H, Hosie Poisson, MD DISCONTD: dexamethasone (DECADRON) 20 mg in sodium chloride 0.9 % 50 mL IVPB, 20 mg, Intravenous, BID, Hosie Poisson, MD;  DISCONTD: dexamethasone (DECADRON) 20 mg in sodium chloride 0.9 % 50 mL IVPB, 20 mg, Intravenous, BID,  Hosie Poisson, MD;  DISCONTD: HYDROmorphone (DILAUDID) 0.5 mg/mL in sodium chloride 0.9 % 100 mL infusion, 1 mg/hr, Intravenous, Once, Hosie Poisson, MD DISCONTD: sulfamethoxazole-trimethoprim (BACTRIM DS) 800-160 MG per tablet 1 tablet, 1 tablet, Oral, 3 times weekly, Hosie Poisson, MD:     . acyclovir  400 mg Oral Daily  . dexamethasone (DECADRON) IVPB  20 mg Intravenous BID  . diphenhydrAMINE  25 mg Intravenous Once  . fluconazole  100 mg Oral Daily  . folic acid  1 mg Oral Daily  . furosemide  20 mg Oral Daily  .  HYDROmorphone (DILAUDID) injection  1 mg Intravenous Once  .  HYDROmorphone (DILAUDID) injection  1 mg Intravenous Once  .  HYDROmorphone (DILAUDID) injection  1 mg Intravenous Once  . magic mouthwash  15 mL Oral TID  . ondansetron  4 mg Intravenous Once  . potassium chloride SA  10 mEq Oral Daily  . potassium chloride  10 mEq Intravenous Q1 Hr x 2  . potassium chloride SA  60 mEq Oral Once  . sertraline  150 mg Oral Daily  . sodium chloride  1,000 mL Intravenous Once  . sulfamethoxazole-trimethoprim  1 tablet Oral Custom  . DISCONTD: dexamethasone (DECADRON) IVPB  10 mg Intravenous Q6H  . DISCONTD: dexamethasone (DECADRON) IVPB  20 mg Intravenous BID  . DISCONTD: dexamethasone (DECADRON) IVPB  20 mg Intravenous BID  . DISCONTD: HYDROmorphone  1 mg/hr Intravenous Once  . DISCONTD: sulfamethoxazole-trimethoprim  1 tablet Oral 3 times weekly  :  Allergies  Allergen Reactions  . [place] Venom   :  Family History  Problem Relation Age of Onset  . Cancer Mother   . Cervical cancer Mother   . Cancer Father   . Lung cancer Father   . Pneumonia Brother   :  History   Social History  . Marital Status: Divorced    Spouse Name: N/A    Number of Children: N/A  . Years of Education: N/A   Occupational History  . Not on file.   Social History Main Topics  . Smoking status: Former Smoker    Quit date: 02/15/2011  . Smokeless tobacco: Never Used  . Alcohol  Use: No  . Drug Use: No  . Sexually Active: No   Other Topics Concern  . Not on file   Social History Narrative  . No narrative on file  :  Pertinent items are noted in HPI.  Exam: Patient Vitals for the past 24 hrs:  BP Temp Temp src Pulse Resp SpO2 Height Weight  12/18/11 1550 122/92 mmHg 98.2 F (36.8 C) Oral 120  18  98 % 5\' 5"  (1.651 m) 193 lb 11.2 oz (87.862 kg)  12/18/11 1229 117/72 mmHg 98.1 F (36.7 C) Oral 110  20  95 % - -  12/18/11 0947 135/95 mmHg - - 109  20  95 % - -  12/18/11 0732 119/91 mmHg 98.3 F (36.8 C) Oral 124  20  96 % 5\' 5"  (1.651 m) 191 lb (86.637 kg)   General appearance: alert Head: Normocephalic, without obvious abnormality, atraumatic Ears: normal TM's and external ear canals both ears Nose: Nares normal. Septum midline. Mucosa normal. No drainage or sinus tenderness. Neck: no adenopathy, no carotid bruit, no JVD, supple, symmetrical, trachea midline and thyroid not enlarged, symmetric, no tenderness/mass/nodules Resp: clear to auscultation bilaterally Chest wall: no tenderness GI: soft, non-tender; bowel sounds normal; no masses,  no organomegaly Extremities: extremities normal, atraumatic, no cyanosis or edema Skin: Skin color, texture, turgor normal. No rashes or lesions   Basename 12/18/11 0805  WBC 4.8  HGB 12.1  HCT 39.0  PLT 7*    Basename 12/18/11 0805  NA 139  K 3.0*  CL 101  CO2 29  GLUCOSE 104*  BUN 14  CREATININE 1.29*  CALCIUM 9.3   Lab Results  Component Value Date   WBC 4.8 12/18/2011   HGB 12.1 12/18/2011   HCT 39.0 12/18/2011   MCV 82.5 12/18/2011   PLT 7* 12/18/2011    Lab 12/18/11 0805  NA 139  K 3.0*  CL 101  CO2 29  BUN 14  CREATININE 1.29*  CALCIUM 9.3  PROT 5.9*  BILITOT 0.3  ALKPHOS 67  ALT 84*  AST 57*  GLUCOSE 104*     Blood smear review: decrease platalet. No rbc fragments.     Ct Head Wo Contrast  12/18/2011  *RADIOLOGY REPORT*  Clinical Data:  Severe headache for 2 weeks.  No  injury. History of hypertension.  History of ITP.  CT HEAD WITHOUT CONTRAST  Technique:  Contiguous axial images were obtained from the base of the skull through the vertex without contrast  Comparison:  12/19/2010  Findings:  The brain has a normal appearance without evidence for hemorrhage, acute infarction, hydrocephalus, or mass lesion.  There is no extra axial fluid collection.  The skull and paranasal sinuses are normal. No change from priors.  IMPRESSION: Normal CT of the head without contrast.  Original Report Authenticated By: Staci Righter, M.D.    Assessment and Plan:   46 year old with ITP. 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 She has been on chronic steroids since that time on and have been most recently on Cytoxan a total of 100 mg daily. She had been on slow tapering doses of steroids and she was to offer him altogether last week. She did not know that she has to continue Cytoxan and she stopped that as well. And now presented with acute exacerbation of her chronic ITP.   Management recommendations: I would start with IV dexamethasone that you're doing I will probably switch her to by mouth prednisone tomorrow a total of 1 mg per kilogram. I will consider adding IV Ig her platelets do not respond the next 24-48 hours. Upon her discharge are probably restart her on Cytoxan for maintenance dose and I will defer her prednisone taper to Dr. Truddie Coco to be done as an outpatient. I will recommend continued to GI prophylaxis at this time. We will follow with you during his hospitalization.

## 2011-12-18 NOTE — ED Notes (Signed)
CRITICAL VALUE ALERT  Critical value received:  plt-7,000  Date of notification:  12/18/11  Time of notification:  0906  Critical value read back:yes  Nurse who received alert:  t abbott rn  MD notified (1st page):  sstrand  Time of first page:  0906  MD notified (2nd page):  Time of second page:  Responding MD:  strand  Time MD responded:  [phone]

## 2011-12-18 NOTE — ED Notes (Signed)
Pt states had lab work done yesterday and doctor on call called her this am at 330am to report critical low platelets.pt states has Chronic ITP.

## 2011-12-19 ENCOUNTER — Inpatient Hospital Stay (HOSPITAL_COMMUNITY): Payer: BC Managed Care – PPO

## 2011-12-19 DIAGNOSIS — E876 Hypokalemia: Secondary | ICD-10-CM

## 2011-12-19 DIAGNOSIS — D693 Immune thrombocytopenic purpura: Principal | ICD-10-CM

## 2011-12-19 DIAGNOSIS — B370 Candidal stomatitis: Secondary | ICD-10-CM

## 2011-12-19 DIAGNOSIS — R51 Headache: Secondary | ICD-10-CM

## 2011-12-19 LAB — COMPREHENSIVE METABOLIC PANEL
ALT: 83 U/L — ABNORMAL HIGH (ref 0–35)
AST: 59 U/L — ABNORMAL HIGH (ref 0–37)
Albumin: 3.1 g/dL — ABNORMAL LOW (ref 3.5–5.2)
CO2: 28 mEq/L (ref 19–32)
Chloride: 100 mEq/L (ref 96–112)
Creatinine, Ser: 1.37 mg/dL — ABNORMAL HIGH (ref 0.50–1.10)
Sodium: 135 mEq/L (ref 135–145)
Total Bilirubin: 0.3 mg/dL (ref 0.3–1.2)

## 2011-12-19 LAB — CBC
Hemoglobin: 11.4 g/dL — ABNORMAL LOW (ref 12.0–15.0)
MCH: 25.1 pg — ABNORMAL LOW (ref 26.0–34.0)
MCHC: 30.4 g/dL (ref 30.0–36.0)
MCV: 82.6 fL (ref 78.0–100.0)
Platelets: 13 10*3/uL — CL (ref 150–400)
RBC: 4.54 MIL/uL (ref 3.87–5.11)

## 2011-12-19 MED ORDER — PREDNISONE 50 MG PO TABS
80.0000 mg | ORAL_TABLET | Freq: Every day | ORAL | Status: DC
  Administered 2011-12-19 – 2011-12-20 (×2): 80 mg via ORAL
  Filled 2011-12-19 (×2): qty 1

## 2011-12-19 MED ORDER — PANTOPRAZOLE SODIUM 40 MG PO TBEC
40.0000 mg | DELAYED_RELEASE_TABLET | Freq: Every day | ORAL | Status: DC
  Administered 2011-12-19 – 2011-12-20 (×2): 40 mg via ORAL
  Filled 2011-12-19 (×2): qty 1

## 2011-12-19 NOTE — Consult Note (Signed)
[facility] INPATIENT PROGRESS NOTE  Name: Summer Cook      MRN: [id number]    Location: 1340/1340-01  Date: 12/19/2011 Time:8:49 AM   Subjective: Interval History:Jodi B Canipe reported feeling relatively well.  Her chronic headache today is mild. She denies visual changes,nausea/vomiting.  With respect to her ITP, she denies visible source of bleeding except for some petechiae on her left ankle.   She denies CP, SOB, abdominal pain, change in bowel/bladder habit.   Objective: Vital signs in last 24 hours: Temp:  [97.5 F (36.4 C)-98.3 F (36.8 C)] 97.5 F (36.4 C) (03/23 0540) Pulse Rate:  [101-120] 101  (03/23 0540) Resp:  [18-20] 18  (03/23 0540) BP: (117-135)/(72-95) 133/88 mmHg (03/23 0600) SpO2:  [94 %-98 %] 96 % (03/23 0540) Weight:  [193 lb 11.2 oz (87.862 kg)] 193 lb 11.2 oz (87.862 kg) (03/22 1550)    Intake/Output from previous day: 03/22 0701 - 03/23 0700 In: 395  Out: 1250 [Urine:1250]     PHYSICAL EXAM:  Gen: mildly obese woman in no acute distress. There was moon facie and buffalo hump.  Eyes: No scleral icterus or jaundice. ENT: There was no oropharyngeal lesions. Neck was supple without thyromegaly. Lymphatics: Negative for cervical, supraclavicular, axillary, or inguinal adenopathy.  Respiratory: Lungs were clear the left without wheezing or crackles. Cardiovascular: normal heart rate and rhythm; S1/S2; without murmur, rubs, or gallop. There was no pedal edema. GI: Abdomen was soft, flat, nontender, nondistended, without organomegaly. Musculoskeletal exam: No spinal tenderness on palpation of vertebral spine. Skin exam was without ecchymosis, petechiae. Neuro exam was nonfocal. PERRL, extraoccular movement intact.  Patient was alert and oriented. Attention was good. Language was appropriate. Mood was normal without depression. Speech was not pressured. Thought content was not tangential.       Studies/Results: Results for orders placed during  the hospital encounter of 12/18/11 (from the past 48 hour(s))  CBC     Status: Abnormal   Collection Time   12/18/11  8:05 AM      Component Value Range Comment   WBC 4.8  4.0 - 10.5 (K/uL)    RBC 4.73  3.87 - 5.11 (MIL/uL)    Hemoglobin 12.1  12.0 - 15.0 (g/dL)    HCT 39.0  36.0 - 46.0 (%)    MCV 82.5  78.0 - 100.0 (fL)    MCH 25.6 (*) 26.0 - 34.0 (pg)    MCHC 31.0  30.0 - 36.0 (g/dL)    RDW 21.8 (*) 11.5 - 15.5 (%)    Platelets 7 (*) 150 - 400 (K/uL)   DIFFERENTIAL     Status: Normal   Collection Time   12/18/11  8:05 AM      Component Value Range Comment   Neutrophils Relative 57  43 - 77 (%)    Neutro Abs 2.8  1.7 - 7.7 (K/uL)    Lymphocytes Relative 33  12 - 46 (%)    Lymphs Abs 1.6  0.7 - 4.0 (K/uL)    Monocytes Relative 9  3 - 12 (%)    Monocytes Absolute 0.4  0.1 - 1.0 (K/uL)    Eosinophils Relative 0  0 - 5 (%)    Eosinophils Absolute 0.0  0.0 - 0.7 (K/uL)    Basophils Relative 1  0 - 1 (%)    Basophils Absolute 0.0  0.0 - 0.1 (K/uL)    Smear Review PLATELETS APPEAR DECREASED     COMPREHENSIVE METABOLIC PANEL  Status: Abnormal   Collection Time   12/18/11  8:05 AM      Component Value Range Comment   Sodium 139  135 - 145 (mEq/L)    Potassium 3.0 (*) 3.5 - 5.1 (mEq/L)    Chloride 101  96 - 112 (mEq/L)    CO2 29  19 - 32 (mEq/L)    Glucose, Bld 104 (*) 70 - 99 (mg/dL)    BUN 14  6 - 23 (mg/dL)    Creatinine, Ser 1.29 (*) 0.50 - 1.10 (mg/dL)    Calcium 9.3  8.4 - 10.5 (mg/dL)    Total Protein 5.9 (*) 6.0 - 8.3 (g/dL)    Albumin 3.0 (*) 3.5 - 5.2 (g/dL)    AST 57 (*) 0 - 37 (U/L)    ALT 84 (*) 0 - 35 (U/L)    Alkaline Phosphatase 67  39 - 117 (U/L)    Total Bilirubin 0.3  0.3 - 1.2 (mg/dL)    GFR calc non Af Amer 49 (*) >90 (mL/min)    GFR calc Af Amer 57 (*) >90 (mL/min)   COMPREHENSIVE METABOLIC PANEL     Status: Abnormal   Collection Time   12/19/11  4:25 AM      Component Value Range Comment   Sodium 135  135 - 145 (mEq/L)    Potassium 4.5  3.5 - 5.1  (mEq/L)    Chloride 100  96 - 112 (mEq/L)    CO2 28  19 - 32 (mEq/L)    Glucose, Bld 240 (*) 70 - 99 (mg/dL)    BUN 15  6 - 23 (mg/dL)    Creatinine, Ser 1.37 (*) 0.50 - 1.10 (mg/dL)    Calcium 8.8  8.4 - 10.5 (mg/dL)    Total Protein 6.1  6.0 - 8.3 (g/dL)    Albumin 3.1 (*) 3.5 - 5.2 (g/dL)    AST 59 (*) 0 - 37 (U/L)    ALT 83 (*) 0 - 35 (U/L)    Alkaline Phosphatase 68  39 - 117 (U/L)    Total Bilirubin 0.3  0.3 - 1.2 (mg/dL)    GFR calc non Af Amer 45 (*) >90 (mL/min)    GFR calc Af Amer 53 (*) >90 (mL/min)   CBC     Status: Abnormal   Collection Time   12/19/11  4:25 AM      Component Value Range Comment   WBC 3.1 (*) 4.0 - 10.5 (K/uL) RARE NRBCs   RBC 4.54  3.87 - 5.11 (MIL/uL)    Hemoglobin 11.4 (*) 12.0 - 15.0 (g/dL)    HCT 37.5  36.0 - 46.0 (%)    MCV 82.6  78.0 - 100.0 (fL)    MCH 25.1 (*) 26.0 - 34.0 (pg)    MCHC 30.4  30.0 - 36.0 (g/dL)    RDW 21.7 (*) 11.5 - 15.5 (%)    Platelets 13 (*) 150 - 400 (K/uL)   PROTIME-INR     Status: Normal   Collection Time   12/19/11  4:25 AM      Component Value Range Comment   Prothrombin Time 12.2  11.6 - 15.2 (seconds)    INR 0.89  0.00 - 1.49    APTT     Status: Normal   Collection Time   12/19/11  4:25 AM      Component Value Range Comment   aPTT 28  24 - 37 (seconds)    Ct Head Wo Contrast  12/18/2011  *RADIOLOGY  REPORT*  Clinical Data:  Severe headache for 2 weeks.  No injury. History of hypertension.  History of ITP.  CT HEAD WITHOUT CONTRAST  Technique:  Contiguous axial images were obtained from the base of the skull through the vertex without contrast  Comparison:  12/19/2010  Findings:  The brain has a normal appearance without evidence for hemorrhage, acute infarction, hydrocephalus, or mass lesion.  There is no extra axial fluid collection.  The skull and paranasal sinuses are normal. No change from priors.  IMPRESSION: Normal CT of the head without contrast.  Original Report Authenticated By: Staci Righter, M.D.      MEDICATIONS: reviewed.      Assessment/Plan:   1.  Recurrent ITP:  Response with Dexamethasone IV started yesterday.  Last dose given this morning.  Since she has responded, we will stopped Decadron and restart Prednisone 1mg /kg (she weighs around 80kg).  Normally, Prednisone is given in the morning, but her Decadron was given already this morning, we'll give first dose Prednisone this evening and eventually move it to the morning.  Pt is anxious to be discharged.  I advised her that her plt count should ideally be better before discharge and ensures that she is responding to steroid before d/c.  She was on Cytoxan 50mg  PO daily before (which she mistakenly discontinued when she tapered off of her Prenisone).  I will continue to hold off on resuming Cytoxan for now until she has a brisk response to Prednisone. While on Prednisone, she will continue on Acyclovir, Bactrim.  She has recent thrush, and is thus continued on Diflucan.   2.  Elevated LFT's:  Possibly due to Cytoxan.  Pending Abdominal US today.  3.  Mild renal insufficiency:  Slight improvement of her Cr with IVF.   Continue to observe for now.   4.  Headache:  Most likely chronic.  Head CT was negative for bleed.

## 2011-12-19 NOTE — Progress Notes (Signed)
Subjective: NOO NEW COMPLAINTS.   Objective: Weight change:   Intake/Output Summary (Last 24 hours) at 12/19/11 1353 Last data filed at 12/19/11 1257  Gross per 24 hour  Intake    395 ml  Output   1750 ml  Net  -1355 ml    Filed Vitals:   12/19/11 0600  BP: 133/88  Pulse:   Temp:   Resp:    gen : NAD Cardiovascular: RRR, S1 normal, S2 normal, no MRG, pulses symmetric and intact bilaterally  Pulmonary/Chest: CTAB, no wheezes, rales, or rhonchi Abdominal: Soft. Non-tender, non-distended, bowel sounds are normal, no masses, organomegaly, or guarding present. Extremities: no pedal edema  Lab Results: Results for orders placed during the hospital encounter of 12/18/11 (from the past 24 hour(s))  COMPREHENSIVE METABOLIC PANEL     Status: Abnormal   Collection Time   12/19/11  4:25 AM      Component Value Range   Sodium 135  135 - 145 (mEq/L)   Potassium 4.5  3.5 - 5.1 (mEq/L)   Chloride 100  96 - 112 (mEq/L)   CO2 28  19 - 32 (mEq/L)   Glucose, Bld 240 (*) 70 - 99 (mg/dL)   BUN 15  6 - 23 (mg/dL)   Creatinine, Ser 1.37 (*) 0.50 - 1.10 (mg/dL)   Calcium 8.8  8.4 - 10.5 (mg/dL)   Total Protein 6.1  6.0 - 8.3 (g/dL)   Albumin 3.1 (*) 3.5 - 5.2 (g/dL)   AST 59 (*) 0 - 37 (U/L)   ALT 83 (*) 0 - 35 (U/L)   Alkaline Phosphatase 68  39 - 117 (U/L)   Total Bilirubin 0.3  0.3 - 1.2 (mg/dL)   GFR calc non Af Amer 45 (*) >90 (mL/min)   GFR calc Af Amer 53 (*) >90 (mL/min)  CBC     Status: Abnormal   Collection Time   12/19/11  4:25 AM      Component Value Range   WBC 3.1 (*) 4.0 - 10.5 (K/uL)   RBC 4.54  3.87 - 5.11 (MIL/uL)   Hemoglobin 11.4 (*) 12.0 - 15.0 (g/dL)   HCT 37.5  36.0 - 46.0 (%)   MCV 82.6  78.0 - 100.0 (fL)   MCH 25.1 (*) 26.0 - 34.0 (pg)   MCHC 30.4  30.0 - 36.0 (g/dL)   RDW 21.7 (*) 11.5 - 15.5 (%)   Platelets 13 (*) 150 - 400 (K/uL)  PROTIME-INR     Status: Normal   Collection Time   12/19/11  4:25 AM      Component Value Range   Prothrombin Time 12.2   11.6 - 15.2 (seconds)   INR 0.89  0.00 - 1.49   APTT     Status: Normal   Collection Time   12/19/11  4:25 AM      Component Value Range   aPTT 28  24 - 37 (seconds)     Micro Results: No results found for this or any previous visit (from the past 240 hour(s)).  Studies/Results: Ct Head Wo Contrast  12/18/2011  *RADIOLOGY REPORT*  Clinical Data:  Severe headache for 2 weeks.  No injury. History of hypertension.  History of ITP.  CT HEAD WITHOUT CONTRAST  Technique:  Contiguous axial images were obtained from the base of the skull through the vertex without contrast  Comparison:  12/19/2010  Findings:  The brain has a normal appearance without evidence for hemorrhage, acute infarction, hydrocephalus, or mass lesion.  There is no  extra axial fluid collection.  The skull and paranasal sinuses are normal. No change from priors.  IMPRESSION: Normal CT of the head without contrast.  Original Report Authenticated By: Staci Righter, M.D.   US Abdomen Complete  12/19/2011  *RADIOLOGY REPORT*  Clinical Data:  Elevated liver function tests.  Renal insufficiency.  Prior cholecystectomy and splenectomy.  COMPLETE ABDOMINAL ULTRASOUND  Comparison:  CT of 10/08/2010.  Findings:  Gallbladder:  Surgically absent  Common bile duct: Normal, 3 mm.  Liver: Normal in echogenicity, without focal lesion.  IVC: Negative  Pancreas:  Negative  Spleen:  Surgically absent  Right Kidney:  11.0 cm. No hydronephrosis.  Left Kidney:  11.0 cm.  Apparent renal pelvic fullness could be secondary to an extrarenal pelvis.  Image 57.  Abdominal aorta:  Nonaneurysmal without ascites.  IMPRESSION: No acute process or explanation for pain.  Favor left extrarenal pelvis.  Minimal pelviectasis could look similar.  Original Report Authenticated By: Areta Haber, M.D.   Medications: Scheduled Meds:   . acyclovir  400 mg Oral Daily  . fluconazole  100 mg Oral Daily  . folic acid  1 mg Oral Daily  . furosemide  20 mg Oral Daily  .   HYDROmorphone (DILAUDID) injection  1 mg Intravenous Once  . magic mouthwash  15 mL Oral TID  . pantoprazole  40 mg Oral Q1200  . potassium chloride SA  10 mEq Oral Daily  . potassium chloride  10 mEq Intravenous Q1 Hr x 2  . predniSONE  80 mg Oral Q breakfast  . sertraline  150 mg Oral Daily  . sulfamethoxazole-trimethoprim  1 tablet Oral Custom  . DISCONTD: dexamethasone (DECADRON) IVPB  10 mg Intravenous Q6H  . DISCONTD: dexamethasone (DECADRON) IVPB  20 mg Intravenous BID  . DISCONTD: dexamethasone (DECADRON) IVPB  20 mg Intravenous BID  . DISCONTD: dexamethasone (DECADRON) IVPB  20 mg Intravenous BID  . DISCONTD: dexamethasone (DECADRON) IVPB  20 mg Intravenous Q12H  . DISCONTD: HYDROmorphone  1 mg/hr Intravenous Once  . DISCONTD: sulfamethoxazole-trimethoprim  1 tablet Oral 3 times weekly   Continuous Infusions:  PRN Meds:.acetaminophen, albuterol, ALPRAZolam, HYDROcodone-acetaminophen, morphine, ondansetron (ZOFRAN) IV, ondansetron  Assessment/Plan:  LOS: 1 day  1.ITP (idiopathic thrombocytopenic purpura): improving,  oncology recommended changing decadron to prednisone.   2.Thrush: on diflucan.   3.Headache: pain control with morphine and tylenol. CT HEAD done which is normal.  4.Hypokalemia: will be repleted as needed.  5. Elevated liver function tests: asymptomatic. Will get an ultrasound of he abdomen.  6. Mild renal insufficiency: Gentle hydration And will continue to monitor.  7. DVT prophylaxis: scd's  Summer Cook 12/19/2011, 1:53 PM

## 2011-12-19 NOTE — Progress Notes (Signed)
CRITICAL VALUE ALERT  Critical value received: Plt Count - 13,000 Date of notification: 12/19/11 Time of notification: 0524 Critical value read back:yes  Nurse who received alert: Krista Blue, RN MD notified (1st page): Dr. Jonelle Sidle Time of first page: 0528 MD notified (2nd page): Dr. Jonelle Sidle Time of second page: [phone] Responding MD: Dr. Jonelle Sidle Time MD responded: [phone]

## 2011-12-20 LAB — CBC
Hemoglobin: 10.3 g/dL — ABNORMAL LOW (ref 12.0–15.0)
MCH: 25.4 pg — ABNORMAL LOW (ref 26.0–34.0)
Platelets: 89 10*3/uL — ABNORMAL LOW (ref 150–400)
RBC: 4.05 MIL/uL (ref 3.87–5.11)
WBC: 13.9 10*3/uL — ABNORMAL HIGH (ref 4.0–10.5)

## 2011-12-20 LAB — COMPREHENSIVE METABOLIC PANEL
ALT: 61 U/L — ABNORMAL HIGH (ref 0–35)
AST: 30 U/L (ref 0–37)
Alkaline Phosphatase: 62 U/L (ref 39–117)
CO2: 26 mEq/L (ref 19–32)
Calcium: 9.1 mg/dL (ref 8.4–10.5)
Chloride: 105 mEq/L (ref 96–112)
GFR calc Af Amer: 54 mL/min — ABNORMAL LOW (ref >90)
GFR calc non Af Amer: 46 mL/min — ABNORMAL LOW (ref >90)
Glucose, Bld: 234 mg/dL — ABNORMAL HIGH (ref 70–99)
Potassium: 4.4 mEq/L (ref 3.5–5.1)
Sodium: 138 mEq/L (ref 135–145)
Total Bilirubin: 0.2 mg/dL — ABNORMAL LOW (ref 0.3–1.2)

## 2011-12-20 MED ORDER — PANTOPRAZOLE SODIUM 40 MG PO TBEC: 40.0000 mg | DELAYED_RELEASE_TABLET | Freq: Every day | ORAL | Status: DC

## 2011-12-20 MED ORDER — PREDNISONE 20 MG PO TABS: 60.0000 mg | ORAL_TABLET | Freq: Every day | ORAL | Status: DC

## 2011-12-20 NOTE — Progress Notes (Signed)
Pt. States feeling better and wanting to go home today. Denies any pain. Appetite good. Ambulating to the bathroom by herself. IV dc'd. Discharge instructions given to pt. With prescritions.

## 2011-12-20 NOTE — Discharge Summary (Signed)
DISCHARGE SUMMARY  Summer Cook  MR#: [id number]  DOB:[birth year]  Date of Admission: 12/18/2011 Date of Discharge: 12/29/2011  Attending Physician:Nisaiah Bechtol  Patient's [id number] CABOT, MD, MD  Consults:Treatment Team:  Eston Esters, MD-  Discharge Diagnoses: Present on Admission:  .ITP (idiopathic thrombocytopenic purpura) .Headache .Renal insufficiency .Hypokalemia   Initial presentation: 46 year old lady with a known h/o ITP, on chronic steroids, who also just got off steroids about a week ago, s/p spleenectomy was asked to come to ER for a platelet count of 7000, from the lab test done Yesterday. Today she also complained of a headache, reports its similar to the migraine headache that she gets, which did n't get better with pain medication. She was found to have platelets of less than 10,000. She was admitted for further management of ITP    Hospital Course:  1.ITP (idiopathic thrombocytopenic purpura): improved dramatically with steroids. Oncology recommendations appreciated. She was discharged on prednisone and recommended to follow with Dr Truddie Coco tomorrow.  No signs of bleeding.  2.Thrush: on diflucan.   3.Headache:  Resolved with pain control with morphine and tylenol. CT HEAD done which is normal.  4.Hypokalemia: repleted   5. Elevated liver function tests: asymptomatic. Ultrasound of the abdomen normal.   6. Mild renal insufficiency: Gentle hydration  And outpatient follow up with pcp . 7. Tachycardia: EKG shows sinus tachycardia. Most likely related to disease process.     Medication List  As of 12/29/2011 11:09 AM   STOP taking these medications         acetaminophen 650 MG CR tablet      cyclophosphamide 50 MG tablet         TAKE these medications         acyclovir 400 MG tablet   Commonly known as: ZOVIRAX   Take 400 mg by mouth Daily.      albuterol 108 (90 BASE) MCG/ACT inhaler   Commonly known as: PROVENTIL HFA;VENTOLIN HFA   Inhale  into the lungs every 4 (four) hours as needed.      ALPRAZolam 0.5 MG tablet   Commonly known as: XANAX   Take one - two tablets, PO, at HS      BAYER CONTOUR TEST test strip   Generic drug: glucose blood   Daily.      fluconazole 100 MG tablet   Commonly known as: DIFLUCAN   Take 100 mg by mouth Daily.      folic acid 1 MG tablet   Commonly known as: FOLVITE   Take 1 mg by mouth daily.      furosemide 20 MG tablet   Commonly known as: LASIX   Take 20 mg by mouth Daily.      HYDROcodone-acetaminophen 5-325 MG per tablet   Commonly known as: NORCO   Take 1 tablet by mouth every 4 (four) hours as needed. One every 4-6 hrs prn pain      magic mouthwash Soln   Take 15 mLs by mouth 3 (three) times daily.      pantoprazole 40 MG tablet   Commonly known as: PROTONIX   Take 1 tablet (40 mg total) by mouth daily at 12 noon.      potassium chloride SA 20 MEQ tablet   Commonly known as: K-DUR,KLOR-CON   Take 20 mg by mouth Daily.      predniSONE 20 MG tablet   Commonly known as: DELTASONE   Take 3 tablets (60 mg total) by mouth daily. Taper as  directed      sertraline 50 MG tablet   Commonly known as: ZOLOFT   Take 150 mg by mouth daily.      sulfamethoxazole-trimethoprim 800-160 MG per tablet   Commonly known as: BACTRIM DS   Take 800 mg by mouth Twice daily. Take on m-w-f      triamterene-hydrochlorothiazide 37.5-25 MG per capsule   Commonly known as: DYAZIDE   Take 1 capsule by mouth every morning.             Day of Discharge BP 129/96  Pulse 110  Temp(Src) 97.8 F (36.6 C) (Oral)  Resp 20  Ht 5\' 5"  (1.651 m)  Wt 87.862 kg (193 lb 11.2 oz)  BMI 32.23 kg/m2  SpO2 100%  LMP 05/13/2011  Physical Exam: gen : NAD  Cardiovascular: tachycardic,  Regular. S1 normal, S2 normal, no MRG, pulses symmetric and intact bilaterally  Pulmonary/Chest: CTAB, no wheezes, rales, or rhonchi  Abdominal: Soft. Non-tender, non-distended, bowel sounds are normal, no masses,  organomegaly, or guarding present.  Extremities: no pedal edema   No results found for this or any previous visit (from the past 24 hour(s)).  Disposition: home   Follow-up Appts: Discharge Orders    Future Appointments: Provider: Department: Dept Phone: Center:   12/31/2011 10:15 AM Maree Krabbe [facility] [phone] None   01/07/2012 12:00 PM [facility] [phone] None   01/07/2012 12:30 PM Eston Esters, MD [facility] [phone] None     Future Orders Please Complete By Expires   Diet - low sodium heart healthy      Discharge instructions      Comments:   Follow up with Dr Truddie Coco ON Monday.   Activity as tolerated - No restrictions         Follow-up Information    Follow up with GOLDING,JOHN CABOT, MD .         Tests Needing Follow-up: Platelet count in 2 days.   Time spent in discharge (includes decision making & examination of pt): 45 minutes  Signed: Lee Kalt 12/29/2011, 11:09 AM

## 2011-12-20 NOTE — Plan of Care (Signed)
Problem: Discharge Progression Outcomes Goal: Discharge plan in place and appropriate Outcome: Adequate for Discharge Pt. Going home

## 2011-12-20 NOTE — Progress Notes (Signed)
Subjective: Patient feels very well, she tolerated the steroids very well thus, she has had a good response with the platelets now at 89,000 which she tells me is good for her. She is a little concerned about taking to the cytoxan due to her liver functions. No other complaints are offered.  Objective:  Most recent Vital signs: Blood pressure 129/96, pulse 110, temperature 97.8 F (36.6 C), temperature source Oral, resp. rate 20, height 5\' 5"  (1.651 m), weight 193 lb 11.2 oz (87.862 kg), last menstrual period 05/13/2011, SpO2 100.00%. 5\' 5"  (1.651 m)    Body surface area is 2.01 meters squared.  Intake/Output from previous day: 03/23 0701 - 03/24 0700 In: 480 [P.O.:480] Out: 1625 [Urine:1625]   Lab Results:   Basename 12/20/11 0433 12/19/11 0425  WBC 13.9* 3.1*  HGB 10.3* 11.4*  HCT 33.2* 37.5  PLT 89* 13*   BMET:  Basename 12/20/11 0433 12/19/11 0425  NA 138 135  K 4.4 4.5  CL 105 100  CO2 26 28  GLUCOSE 234* 240*  BUN 24* 15  CREATININE 1.35* 1.37*  CALCIUM 9.1 8.8   CMP:     Component Value Date/Time   NA 138 12/20/2011 0433   K 4.4 12/20/2011 0433   CL 105 12/20/2011 0433   CO2 26 12/20/2011 0433   GLUCOSE 234* 12/20/2011 0433   BUN 24* 12/20/2011 0433   CREATININE 1.35* 12/20/2011 0433   CALCIUM 9.1 12/20/2011 0433   PROT 5.8* 12/20/2011 0433   ALBUMIN 2.9* 12/20/2011 0433   AST 30 12/20/2011 0433   ALT 61* 12/20/2011 0433   ALKPHOS 62 12/20/2011 0433   BILITOT 0.2* 12/20/2011 0433   GFRNONAA 46* 12/20/2011 0433   GFRAA 54* 12/20/2011 0433   Micro: Results for orders placed in visit on 07/15/11  [facility]     Status: Normal   Collection Time   07/15/11  9:15 AM      Component Value Range Status Comment   Smear Result Smear Available   Final      Studies/Results: US Abdomen Complete  12/19/2011  *RADIOLOGY REPORT*  Clinical Data:  Elevated liver function tests.  Renal insufficiency.  Prior cholecystectomy and splenectomy.  COMPLETE ABDOMINAL ULTRASOUND   Comparison:  CT of 10/08/2010.  Findings:  Gallbladder:  Surgically absent  Common bile duct: Normal, 3 mm.  Liver: Normal in echogenicity, without focal lesion.  IVC: Negative  Pancreas:  Negative  Spleen:  Surgically absent  Right Kidney:  11.0 cm. No hydronephrosis.  Left Kidney:  11.0 cm.  Apparent renal pelvic fullness could be secondary to an extrarenal pelvis.  Image 57.  Abdominal aorta:  Nonaneurysmal without ascites.  IMPRESSION: No acute process or explanation for pain.  Favor left extrarenal pelvis.  Minimal pelviectasis could look similar.  Original Report Authenticated By: Areta Haber, M.D.    Medications: I have reviewed the patient's current medications.  Assessment/Plan: 46 y.o. female with  1. Recurrent ITP: she initially was started on IV dexamethasone and then switched to po prednisone at 80 mg daily with a good response. She is now ready to go home. Her cytoxan has not been resumed. Her LFT's are slightly up and thus we will hold cytoxan. I will defer this decision to her primary oncologist Dr. Truddie Coco.  2. Patient can be discharged on Predinsone at 60 mg daily.   3. She will need to call the office to set up an appointment for lab work and an appointment with Dr. Truddie Coco.  LOS: 2 days   Marcy Panning, MD Medical/Oncology [facility] [phone] (beeper) [phone] (Office)  12/20/2011, 12:10 PM

## 2011-12-21 ENCOUNTER — Other Ambulatory Visit: Payer: Self-pay | Admitting: Physician Assistant

## 2011-12-21 ENCOUNTER — Telehealth: Payer: Self-pay | Admitting: *Deleted

## 2011-12-21 DIAGNOSIS — D693 Immune thrombocytopenic purpura: Secondary | ICD-10-CM

## 2011-12-21 NOTE — Telephone Encounter (Signed)
left message to inform the patient of the new date and time on 12-24-2011 starting at 10:45am

## 2011-12-22 ENCOUNTER — Telehealth: Payer: Self-pay | Admitting: *Deleted

## 2011-12-22 NOTE — Telephone Encounter (Signed)
patient called and confirmed appointment for 12-24-2011 starting at 10:45am

## 2011-12-24 ENCOUNTER — Encounter: Payer: Self-pay | Admitting: Physician Assistant

## 2011-12-24 ENCOUNTER — Encounter: Payer: Self-pay | Admitting: Oncology

## 2011-12-24 ENCOUNTER — Other Ambulatory Visit (HOSPITAL_BASED_OUTPATIENT_CLINIC_OR_DEPARTMENT_OTHER): Payer: BC Managed Care – PPO | Admitting: Lab

## 2011-12-24 ENCOUNTER — Ambulatory Visit (HOSPITAL_BASED_OUTPATIENT_CLINIC_OR_DEPARTMENT_OTHER): Payer: BC Managed Care – PPO | Admitting: Physician Assistant

## 2011-12-24 VITALS — BP 130/94 | HR 123 | Temp 98.5°F | Ht 65.0 in | Wt 193.0 lb

## 2011-12-24 DIAGNOSIS — D6941 Evans syndrome: Secondary | ICD-10-CM

## 2011-12-24 DIAGNOSIS — D693 Immune thrombocytopenic purpura: Secondary | ICD-10-CM

## 2011-12-24 LAB — CBC & DIFF AND RETIC
Basophils Absolute: 0 10*3/uL (ref 0.0–0.1)
EOS%: 0 % (ref 0.0–7.0)
Eosinophils Absolute: 0 10*3/uL (ref 0.0–0.5)
HCT: 38.6 % (ref 34.8–46.6)
HGB: 12.4 g/dL (ref 11.6–15.9)
MCH: 25.3 pg (ref 25.1–34.0)
MCV: 78.8 fL — ABNORMAL LOW (ref 79.5–101.0)
MONO%: 3.2 % (ref 0.0–14.0)
NEUT#: 7.7 10*3/uL — ABNORMAL HIGH (ref 1.5–6.5)
NEUT%: 85.5 % — ABNORMAL HIGH (ref 38.4–76.8)
RDW: 21.3 % — ABNORMAL HIGH (ref 11.2–14.5)
Retic %: 2.88 % — ABNORMAL HIGH (ref 0.70–2.10)
Retic Ct Abs: 141.12 10*3/uL — ABNORMAL HIGH (ref 33.70–90.70)

## 2011-12-24 LAB — CHCC SMEAR

## 2011-12-24 LAB — MORPHOLOGY

## 2011-12-24 LAB — COMPREHENSIVE METABOLIC PANEL
ALT: 70 U/L — ABNORMAL HIGH (ref 0–35)
AST: 34 U/L (ref 0–37)
Alkaline Phosphatase: 78 U/L (ref 39–117)
BUN: 34 mg/dL — ABNORMAL HIGH (ref 6–23)
Calcium: 9.4 mg/dL (ref 8.4–10.5)
Creatinine, Ser: 1.24 mg/dL — ABNORMAL HIGH (ref 0.50–1.10)
Total Bilirubin: 0.4 mg/dL (ref 0.3–1.2)

## 2011-12-24 NOTE — Patient Instructions (Signed)
We are considering Nplate for ITP

## 2011-12-24 NOTE — Progress Notes (Signed)
Put disability form on nurse's desk. °

## 2011-12-25 ENCOUNTER — Telehealth: Payer: Self-pay | Admitting: Oncology

## 2011-12-25 NOTE — Telephone Encounter (Signed)
lmonvm adviisng the pt of her lab appt on 12/31/2011

## 2011-12-25 NOTE — Progress Notes (Signed)
Hematology and Oncology Follow Up Visit  Summer Cook [id number] 1966-02-27 46 y.o. 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  Interim History:    Summer Cook is seen today for followup after her recent hospital discharge. She continues on prednisone 60 mg per day since 12/21/2011. Excluding the fact that she feels "puffy" she is doing pretty well denying fevers, chills, or night sweats. No bleeding or bruising symptoms noted. No shortness of breath or chest pain. She does have quite a bit of an appetite, she denies any heartburn symptoms. No diarrhea or constipation issues. A detailed review of systems is otherwise noncontributory as noted below.  Review of Systems: Constitutional:  no weight loss, fever, night sweats and weight gain Eyes: uses glasses ENT: no complaints Cardiovascular: no chest pain or dyspnea on exertion Respiratory: no cough, shortness of breath, or wheezing Neurological: no TIA or stroke symptoms Dermatological: negative Gastrointestinal: no abdominal pain, change in bowel habits, or black or bloody stools Genito-Urinary: no dysuria, trouble voiding, or hematuria Hematological and Lymphatic: negative Breast: negative for breast lumps Musculoskeletal: negative Remaining ROS negative.   Medications:   I have reviewed the patient's current medications.  Current Outpatient Prescriptions  Medication Sig Dispense Refill  . acyclovir (ZOVIRAX) 400 MG tablet Take 400 mg by mouth Daily.      Marland Kitchen albuterol (PROVENTIL HFA;VENTOLIN HFA) 108 (90 BASE) MCG/ACT inhaler Inhale into the lungs every 4 (four) hours as needed.      . ALPRAZolam (XANAX) 0.5 MG tablet Take one - two tablets, PO, at HS  45 tablet  0  . Alum &  Mag Hydroxide-Simeth (MAGIC MOUTHWASH) SOLN Take 15 mLs by mouth 3 (three) times daily.  250 mL  6  . BAYER CONTOUR TEST test strip Daily.      . fluconazole (DIFLUCAN) 100 MG tablet Take 100 mg by mouth Daily.      . folic acid (FOLVITE) 1 MG tablet Take 1 mg by mouth daily.        . furosemide (LASIX) 20 MG tablet Take 20 mg by mouth Daily.      Marland Kitchen HYDROcodone-acetaminophen (NORCO) 5-325 MG per tablet Take 1 tablet by mouth every 4 (four) hours as needed. One every 4-6 hrs prn pain      . pantoprazole (PROTONIX) 40 MG tablet Take 1 tablet (40 mg total) by mouth daily at 12 noon.  30 tablet  0  . potassium chloride SA (K-DUR,KLOR-CON) 20 MEQ tablet Take 20 mg by mouth Daily.      . predniSONE (DELTASONE) 20 MG tablet Take 3 tablets (60 mg total) by mouth daily. Taper as directed  30 tablet  0  . sertraline (ZOLOFT) 50 MG tablet Take 150 mg by mouth daily.       Marland Kitchen sulfamethoxazole-trimethoprim (BACTRIM DS) 800-160 MG per tablet Take 800 mg by mouth Twice daily. Take on m-w-f      . triamterene-hydrochlorothiazide (DYAZIDE) 37.5-25 MG per capsule Take 1 capsule by mouth every morning.       Marland Kitchen DISCONTD: prochlorperazine (COMPAZINE) 10 MG tablet Take 10 mg by mouth as needed. For nausea         Allergies:  Allergies  Allergen Reactions  . Yellow Jacket Venom     Physical Exam: Filed Vitals:   12/24/11 1122  BP: 130/94  Pulse: 123  Temp: 98.5 F (36.9 C)    Body mass index is 32.12 kg/(m^2). Weight: 193lbs. HEENT:  Sclerae anicteric, conjunctivae pink.  Oropharynx clear.  No mucositis or candidiasis.  She does have cushingoid facies. Nodes:  No cervical, supraclavicular, or axillary lymphadenopathy palpated.  Lungs:  Clear to auscultation bilaterally.  No crackles, rhonchi, or wheezes.   Heart:  Regular rate and rhythm.   Abdomen:  Soft, nontender.  Positive bowel sounds.  No organomegaly or masses palpated.   Musculoskeletal:  No focal spinal tenderness to palpation.  Extremities:   Benign.  No peripheral edema or cyanosis.   Skin:  Benign.   Neuro:  Nonfocal, alert and oriented x 3.   Lab Results: Lab Results  Component Value Date   WBC 9.0 12/24/2011   HGB 12.4 12/24/2011   HCT 38.6 12/24/2011   MCV 78.8* 12/24/2011   PLT 204 12/24/2011   NEUTROABS 7.7* 12/24/2011     Chemistry      Component Value Date/Time   NA 137 12/24/2011 1055   K 4.3 12/24/2011 1055   CL 99 12/24/2011 1055   CO2 25 12/24/2011 1055   BUN 34* 12/24/2011 1055   CREATININE 1.24* 12/24/2011 1055      Component Value Date/Time   CALCIUM 9.4 12/24/2011 1055   ALKPHOS 78 12/24/2011 1055   AST 34 12/24/2011 1055   ALT 70* 12/24/2011 1055   BILITOT 0.4 12/24/2011 1055      No results found for this basename: LABCA2    Radiological Studies: Ct Head Wo Contrast 12/18/2011  *RADIOLOGY REPORT*  Clinical Data:  Severe headache for 2 weeks.  No injury. History of hypertension.  History of ITP.  CT HEAD WITHOUT CONTRAST  Technique:  Contiguous axial images were obtained from the base of the skull through the vertex without contrast  Comparison:  12/19/2010  Findings:  The brain has a normal appearance without evidence for hemorrhage, acute infarction, hydrocephalus, or mass lesion.  There is no extra axial fluid collection.  The skull and paranasal sinuses are normal. No change from priors.  IMPRESSION: Normal CT of the head without contrast.  Original Report Authenticated By: Staci Righter, M.D.   US Abdomen Complete 12/19/2011  *RADIOLOGY REPORT*  Clinical Data:  Elevated liver function tests.  Renal insufficiency.  Prior cholecystectomy and splenectomy.  COMPLETE ABDOMINAL ULTRASOUND  Comparison:  CT of 10/08/2010.  Findings:  Gallbladder:  Surgically absent  Common bile duct: Normal, 3 mm.  Liver: Normal in echogenicity, without focal lesion.  IVC: Negative  Pancreas:  Negative  Spleen:  Surgically absent  Right Kidney:  11.0 cm. No hydronephrosis.  Left Kidney:  11.0 cm.  Apparent renal pelvic fullness  could be secondary to an extrarenal pelvis.  Image 57.  Abdominal aorta:  Nonaneurysmal without ascites.  IMPRESSION: No acute process or explanation for pain.  Favor left extrarenal pelvis.  Minimal pelviectasis could look similar.  Original Report Authenticated By: Areta Haber, M.D.     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  Case reviewed with Dr. Eston Esters who also spoke with Levada Dy.   Plan:  And overall Stela is doing well, we will continue on prednisone, and continue his slow tapering, she will  decrease by 5 mg each week therefore, starting tomorrow she'll go to 55 mg. We will also obtain approval for Nplate injections. Therefore, Kamylle will have her labs obtained locally in 1 weeks time, she is our schedule for followup with Dr. Truddie Coco on 01/07/2012. She can contact us sooner if the need should arise.   This plan was reviewed with the patient, who voices understanding and agreement.  She knows to call with any changes or problems.    Lorenzo Arscott T, PA-C 12/24/11

## 2011-12-28 NOTE — Progress Notes (Signed)
UR completed 

## 2011-12-29 ENCOUNTER — Encounter: Payer: Self-pay | Admitting: Oncology

## 2011-12-29 ENCOUNTER — Telehealth: Payer: Self-pay | Admitting: *Deleted

## 2011-12-29 NOTE — Telephone Encounter (Signed)
patient is getting labs check in [place] requested we cancelled lab for 12-29-2011

## 2011-12-29 NOTE — Progress Notes (Signed)
Faxed disability form to [phone] °

## 2011-12-31 ENCOUNTER — Other Ambulatory Visit: Payer: BC Managed Care – PPO | Admitting: Lab

## 2012-01-07 ENCOUNTER — Other Ambulatory Visit: Payer: BC Managed Care – PPO | Admitting: Lab

## 2012-01-07 ENCOUNTER — Ambulatory Visit: Payer: BC Managed Care – PPO | Admitting: Oncology

## 2012-01-13 ENCOUNTER — Encounter: Payer: Self-pay | Admitting: Oncology

## 2012-01-13 ENCOUNTER — Other Ambulatory Visit: Payer: Self-pay | Admitting: *Deleted

## 2012-01-13 DIAGNOSIS — T50905A Adverse effect of unspecified drugs, medicaments and biological substances, initial encounter: Secondary | ICD-10-CM

## 2012-01-13 DIAGNOSIS — R739 Hyperglycemia, unspecified: Secondary | ICD-10-CM

## 2012-01-14 ENCOUNTER — Other Ambulatory Visit (HOSPITAL_BASED_OUTPATIENT_CLINIC_OR_DEPARTMENT_OTHER): Payer: BC Managed Care – PPO | Admitting: Lab

## 2012-01-14 ENCOUNTER — Telehealth: Payer: Self-pay | Admitting: Oncology

## 2012-01-14 ENCOUNTER — Other Ambulatory Visit: Payer: Self-pay | Admitting: Oncology

## 2012-01-14 DIAGNOSIS — D693 Immune thrombocytopenic purpura: Secondary | ICD-10-CM

## 2012-01-14 DIAGNOSIS — K649 Unspecified hemorrhoids: Secondary | ICD-10-CM

## 2012-01-14 DIAGNOSIS — R739 Hyperglycemia, unspecified: Secondary | ICD-10-CM

## 2012-01-14 LAB — CBC WITH DIFFERENTIAL/PLATELET
BASO%: 0.2 % (ref 0.0–2.0)
EOS%: 0.7 % (ref 0.0–7.0)
MCH: 25.4 pg (ref 25.1–34.0)
MCHC: 31.2 g/dL — ABNORMAL LOW (ref 31.5–36.0)
MONO#: 1 10*3/uL — ABNORMAL HIGH (ref 0.1–0.9)
RBC: 4.49 10*6/uL (ref 3.70–5.45)
RDW: 19.3 % — ABNORMAL HIGH (ref 11.2–14.5)
WBC: 13.8 10*3/uL — ABNORMAL HIGH (ref 3.9–10.3)
lymph#: 2.1 10*3/uL (ref 0.9–3.3)
nRBC: 0 % (ref 0–0)

## 2012-01-14 LAB — TECHNOLOGIST REVIEW

## 2012-01-14 NOTE — Telephone Encounter (Signed)
S/w the pt's aunt and she is aware of the appt on 01/28/2012@12 :45pm.

## 2012-01-20 ENCOUNTER — Telehealth: Payer: Self-pay | Admitting: *Deleted

## 2012-01-20 NOTE — Telephone Encounter (Signed)
Pt. Called and left message that she was going to file for disability again.   Called back and left message that she should contact our managed care department to work with her on this.

## 2012-01-28 ENCOUNTER — Other Ambulatory Visit: Payer: BC Managed Care – PPO | Admitting: Lab

## 2012-01-28 ENCOUNTER — Ambulatory Visit: Payer: BC Managed Care – PPO | Admitting: Physician Assistant

## 2012-02-03 ENCOUNTER — Encounter: Payer: Self-pay | Admitting: Oncology

## 2012-02-04 ENCOUNTER — Other Ambulatory Visit: Payer: Self-pay

## 2012-02-04 ENCOUNTER — Telehealth: Payer: Self-pay

## 2012-02-04 DIAGNOSIS — D693 Immune thrombocytopenic purpura: Secondary | ICD-10-CM

## 2012-02-04 MED ORDER — PREDNISONE 20 MG PO TABS: ORAL_TABLET | ORAL | Status: DC

## 2012-02-04 NOTE — Telephone Encounter (Signed)
Received notification from lab that pt had plt count <10 by CBC with diff done 02/03/12 at outside lab facility.  Per Sherlon Handing, PA, pt needs to come in to office ASAP for repeat CBC with smear and wait for results. Also per Gerald Stabs, if pt is having active bleeding, she should report to ER.  Attempted to call pt at home number listed and left message on unidentified vm to call office back.  Attempted to call pt's cell phone number listed, and received message stating "the wireless customer is unavailable right now, please call again later."  Will attempt to call pt again later.

## 2012-02-04 NOTE — Telephone Encounter (Signed)
Received call back from pt.  Informed her of need to come in for CBC ASAP and wait for results, per [place], [place].  Pt states she is unable to come until 0900 tomorrow.  Pt denies any active bleeding.  Pt states she has been off of her prednisone 2 weeks this past Saturday.  Pt states she started at 80 mg and tapered down.  Gerald Stabs, [place] made aware and states that pt should restart prednisone at 80 mg daily for now until further instructed.  Informed pt of this, and she verbalizes understanding.  Pt wants to know will she need to be seen sooner than her next appt 6/18.  Informed her per Gerald Stabs, [place], she will monitor pt's labs and then determine any changes re: f/u visit.

## 2012-02-05 ENCOUNTER — Other Ambulatory Visit (HOSPITAL_BASED_OUTPATIENT_CLINIC_OR_DEPARTMENT_OTHER): Payer: BC Managed Care – PPO | Admitting: Lab

## 2012-02-05 ENCOUNTER — Other Ambulatory Visit: Payer: Self-pay | Admitting: *Deleted

## 2012-02-05 ENCOUNTER — Other Ambulatory Visit: Payer: Self-pay | Admitting: Physician Assistant

## 2012-02-05 DIAGNOSIS — D849 Immunodeficiency, unspecified: Secondary | ICD-10-CM

## 2012-02-05 DIAGNOSIS — D693 Immune thrombocytopenic purpura: Secondary | ICD-10-CM

## 2012-02-05 LAB — COMPREHENSIVE METABOLIC PANEL
ALT: 48 U/L — ABNORMAL HIGH (ref 0–35)
Albumin: 4.2 g/dL (ref 3.5–5.2)
CO2: 26 mEq/L (ref 19–32)
Calcium: 9.8 mg/dL (ref 8.4–10.5)
Chloride: 101 mEq/L (ref 96–112)
Glucose, Bld: 166 mg/dL — ABNORMAL HIGH (ref 70–99)
Potassium: 3.3 mEq/L — ABNORMAL LOW (ref 3.5–5.3)
Sodium: 138 mEq/L (ref 135–145)
Total Protein: 6.3 g/dL (ref 6.0–8.3)

## 2012-02-05 LAB — CBC & DIFF AND RETIC
BASO%: 0.2 % (ref 0.0–2.0)
Eosinophils Absolute: 0 10*3/uL (ref 0.0–0.5)
MCHC: 31.8 g/dL (ref 31.5–36.0)
MONO#: 0.7 10*3/uL (ref 0.1–0.9)
MONO%: 7 % (ref 0.0–14.0)
NEUT#: 7.8 10*3/uL — ABNORMAL HIGH (ref 1.5–6.5)
RBC: 4.83 10*6/uL (ref 3.70–5.45)
RDW: 17.1 % — ABNORMAL HIGH (ref 11.2–14.5)
Retic %: 2.03 % (ref 0.70–2.10)
WBC: 9.8 10*3/uL (ref 3.9–10.3)
nRBC: 1 % — ABNORMAL HIGH (ref 0–0)

## 2012-02-05 LAB — MORPHOLOGY: PLT EST: DECREASED

## 2012-02-05 LAB — LACTATE DEHYDROGENASE: LDH: 400 U/L — ABNORMAL HIGH (ref 94–250)

## 2012-02-08 ENCOUNTER — Other Ambulatory Visit: Payer: BC Managed Care – PPO

## 2012-02-10 ENCOUNTER — Encounter: Payer: Self-pay | Admitting: Oncology

## 2012-02-10 NOTE — Progress Notes (Signed)
Patient called requesting I fax her disability forms to [id number].

## 2012-02-12 ENCOUNTER — Other Ambulatory Visit: Payer: Self-pay | Admitting: *Deleted

## 2012-02-12 ENCOUNTER — Encounter: Payer: Self-pay | Admitting: *Deleted

## 2012-02-12 ENCOUNTER — Telehealth: Payer: Self-pay | Admitting: *Deleted

## 2012-02-12 DIAGNOSIS — R739 Hyperglycemia, unspecified: Secondary | ICD-10-CM

## 2012-02-12 DIAGNOSIS — Z9081 Acquired absence of spleen: Secondary | ICD-10-CM

## 2012-02-12 DIAGNOSIS — D849 Immunodeficiency, unspecified: Secondary | ICD-10-CM

## 2012-02-12 NOTE — Telephone Encounter (Signed)
Pt REPORTS THAT SHE HAS NOT BEEN FEELING WELL. HER MD VISIT HAD BEEN R/S UNTIL 02/2012 DUE TO MD BEING OUT OF OFFICE. PT APPT HAS BEEN R/S FOR 02/15/12 TO INCLUDE LABS. PT ALSO STATES CONCERN B/C SHE HAS BEEN UNABLE TO PAY FOR MEDICATIONS THAT MD HAS RX FOR HER IN THE PAST. PT ALSO INFORMED THIS DESK NURSE THAT SHE IS UNABLE TO AFFORD HER "INHALER" FOR ASTHMA. THIS DESK NURSE CALLED PT'S PHARMACY AND WAS TOLD THAT THE LOWEST PRICE INHALER WAS 61.00, BUT THAT "BRONCHAID" WAS OTC AND AIDED IN DECREASING THE BRONCHI.  PT WAS ALSO INSTRUCTED TO GO TO THE NEAREST ED IF BREATHING SX WORSENED

## 2012-02-12 NOTE — Telephone Encounter (Signed)
per orders from 02-12-2012 placed patient on the md calendar for 02-15-2012 starting at 3:00pm

## 2012-02-12 NOTE — Telephone Encounter (Signed)
per orders from 02-12-2012 cancelled 02-2012 appointment

## 2012-02-15 ENCOUNTER — Ambulatory Visit: Payer: BC Managed Care – PPO | Admitting: Oncology

## 2012-02-15 ENCOUNTER — Telehealth: Payer: Self-pay | Admitting: *Deleted

## 2012-02-15 ENCOUNTER — Other Ambulatory Visit: Payer: BC Managed Care – PPO | Admitting: Lab

## 2012-02-15 NOTE — Telephone Encounter (Signed)
spoke with the patient and she confirmed over the phone that her appointment has been cancelled and she will be receiving an phone to reschedule

## 2012-02-15 NOTE — Telephone Encounter (Signed)
NOTIFIED PT THAT WE NEEDED TO R/S APPT. WITH MD. PT STATES THAT SHE IS IN CURRENT DISTRESS AND THAT SHE WILL HAVE ROUTIEN LABS AT [place]

## 2012-02-17 ENCOUNTER — Telehealth: Payer: Self-pay | Admitting: *Deleted

## 2012-02-17 ENCOUNTER — Encounter: Payer: Self-pay | Admitting: *Deleted

## 2012-02-17 NOTE — Progress Notes (Unsigned)
PER MD, HAVE NOTIFIED PT TO DECREASE HER PREDNISONE BY 10MG  DAILY FOR A WEEK . X 2 WEEKS. PT WILL F/U WITH MD 03/04/12 AT 1000 FOR LAB WORK THEN TO SEE MD AT 1030

## 2012-02-17 NOTE — Telephone Encounter (Signed)
gave patient appointment for 07-2012 with the lab one week before the md visist printe out  calendar and gave to the patient

## 2012-02-17 NOTE — Telephone Encounter (Signed)
Per pt request, last office note faxed to : [employer] at [phone]

## 2012-02-24 ENCOUNTER — Other Ambulatory Visit: Payer: Self-pay | Admitting: *Deleted

## 2012-02-24 MED ORDER — PREDNISONE 10 MG PO TABS: ORAL_TABLET | ORAL | Status: DC

## 2012-03-04 ENCOUNTER — Ambulatory Visit: Payer: BC Managed Care – PPO | Admitting: Oncology

## 2012-03-04 ENCOUNTER — Other Ambulatory Visit: Payer: Self-pay | Admitting: *Deleted

## 2012-03-04 ENCOUNTER — Other Ambulatory Visit: Payer: BC Managed Care – PPO | Admitting: Lab

## 2012-03-04 DIAGNOSIS — D693 Immune thrombocytopenic purpura: Secondary | ICD-10-CM

## 2012-03-04 MED ORDER — PREDNISONE 20 MG PO TABS: ORAL_TABLET | ORAL | Status: DC

## 2012-03-04 MED ORDER — HYDROCODONE-ACETAMINOPHEN 10-650 MG PO TABS: 1.0000 | ORAL_TABLET | Freq: Four times a day (QID) | ORAL | Status: AC | PRN

## 2012-03-15 ENCOUNTER — Other Ambulatory Visit: Payer: BC Managed Care – PPO | Admitting: Lab

## 2012-03-15 ENCOUNTER — Ambulatory Visit: Payer: BC Managed Care – PPO | Admitting: Oncology

## 2012-03-15 IMAGING — CT CT ABD-PELV W/ CM
2 of 5 series · 16 of 46 positions shown, 18 images · IV contrast (Omnipaque 300)
Comparison: None

CLINICAL DATA: Pancytopenia, evaluate spelnomegaly

CT ABDOMEN AND PELVIS WITH CONTRAST
TECHNIQUE: Multidetector CT imaging of the abdomen and pelvis was
performed following the standard protocol during bolus
administration of intravenous contrast. Breast shield utilized.
Sagittal and coronal MPR images reconstructed from axial data set.
Contrast:  Dilute oral contrast and 100 ml Zmnipaque-FNN IV

[Series 2: abd_pel_with 5.0 b40f · axial · 0.64mm/px · z∈[-464,-54]mm · 13 of 94 slices shown, 15 images]
[im 6/94  soft-tissue]
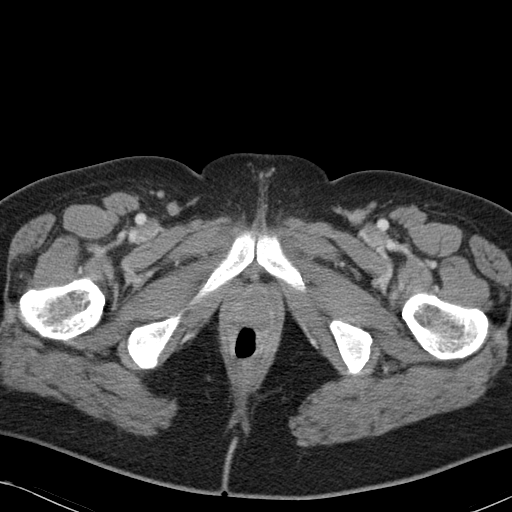
[im 6/94  bone]
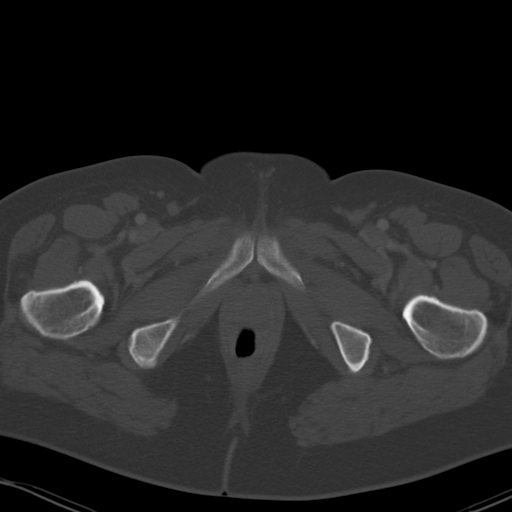
[im 11/94  soft-tissue]
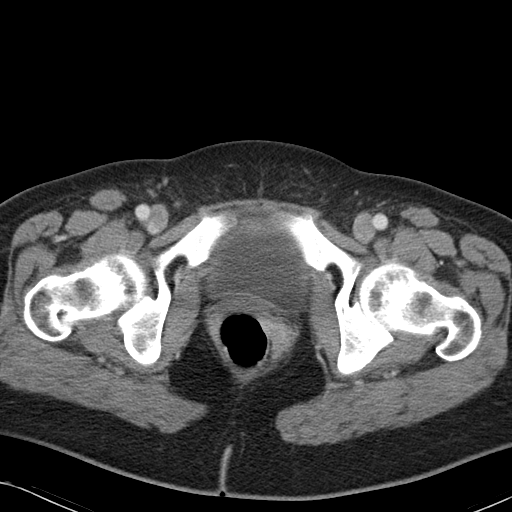
[im 22/94  soft-tissue]
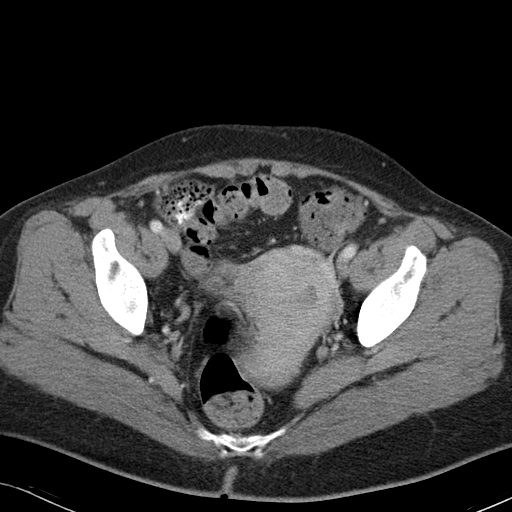
[im 28/94  soft-tissue]
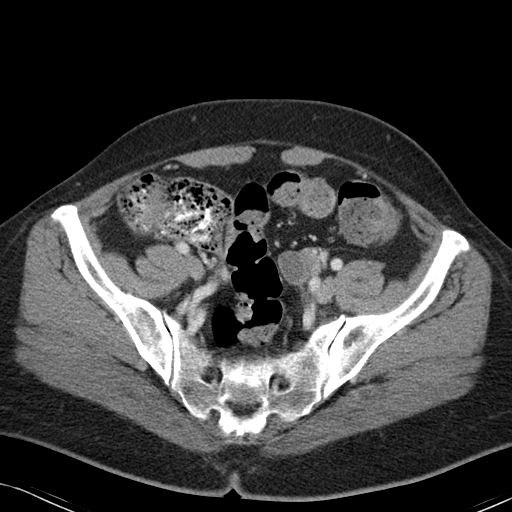
[im 33/94  soft-tissue]
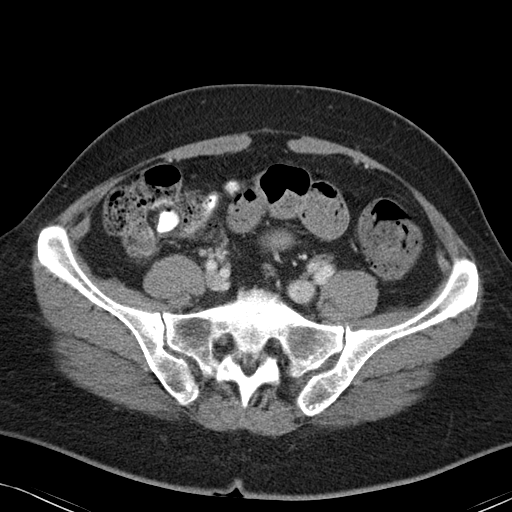
[im 39/94  soft-tissue]
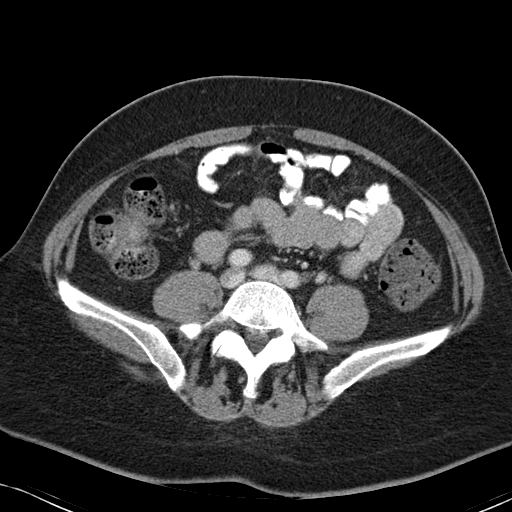
[im 50/94  soft-tissue]
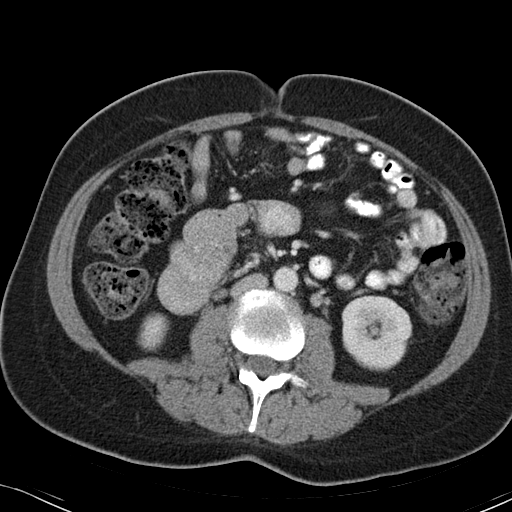
[im 55/94  soft-tissue]
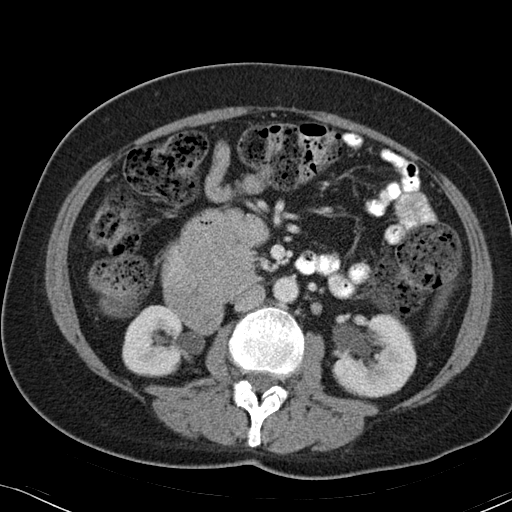
[im 61/94  soft-tissue]
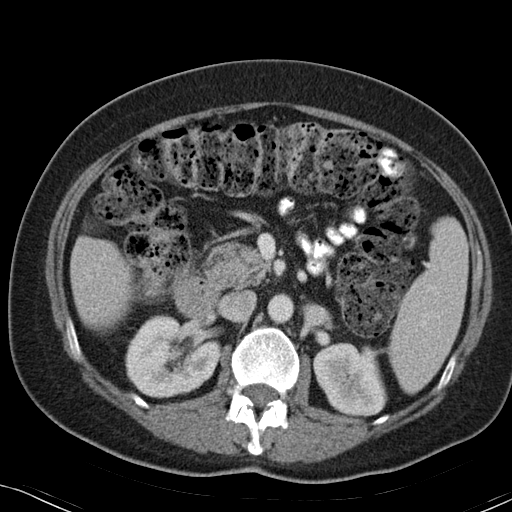
[im 61/94  bone]
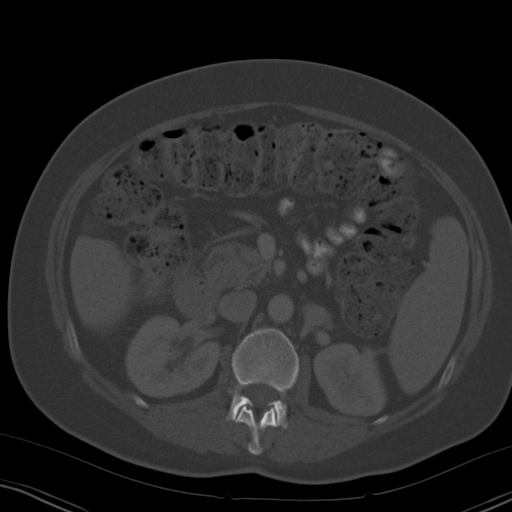
[im 66/94  soft-tissue]
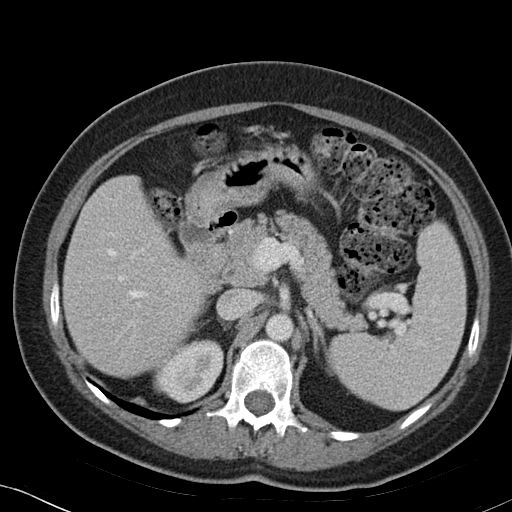
[im 72/94  soft-tissue]
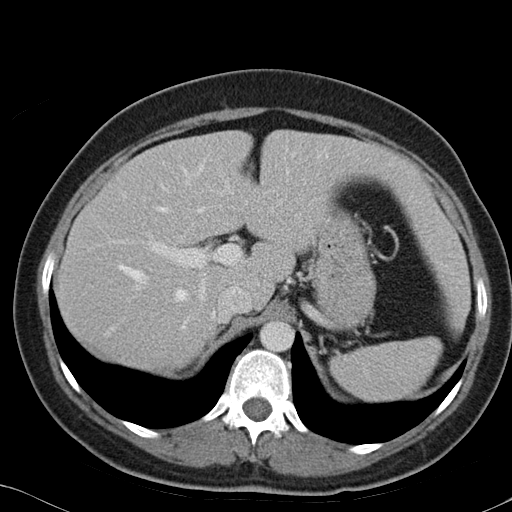
[im 83/94  soft-tissue]
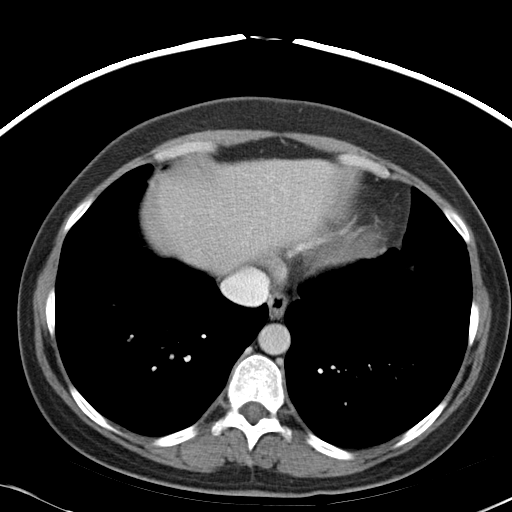
[im 88/94  soft-tissue]
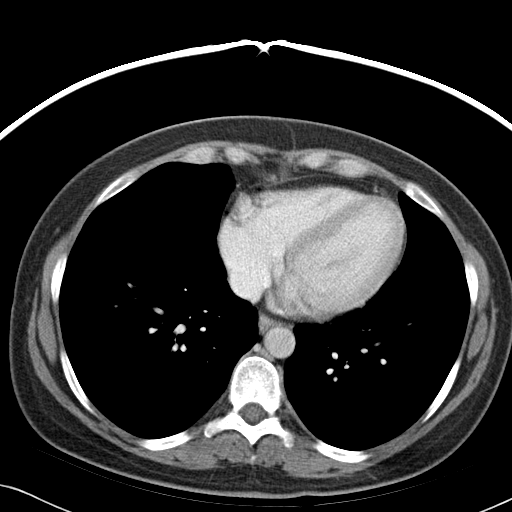

[Series 4: abd_pel_with 3.0 spo cor · coronal · 0.67mm/px · 3 of 87 slices shown]
[im 29/87  soft-tissue]
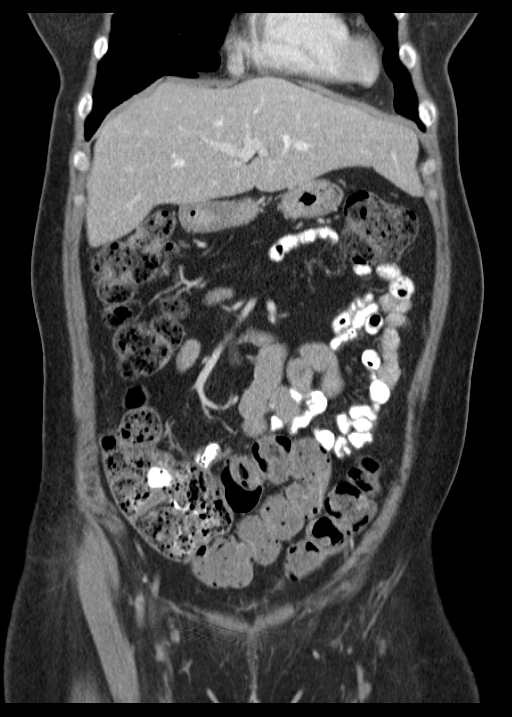
[im 39/87  soft-tissue]
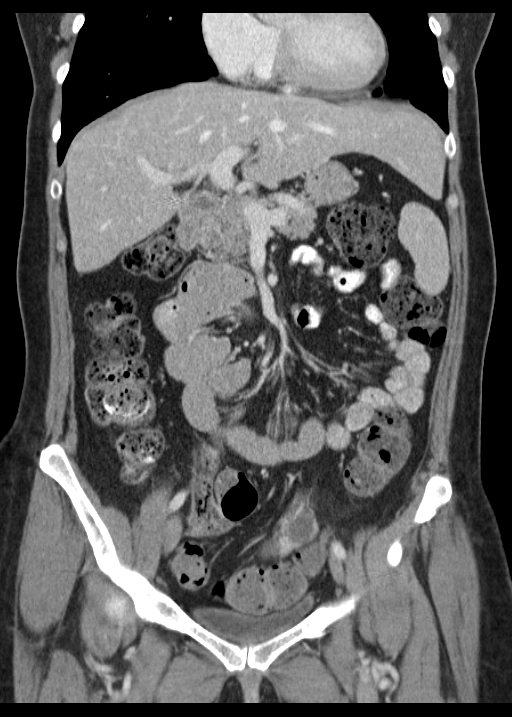
[im 48/87  soft-tissue]
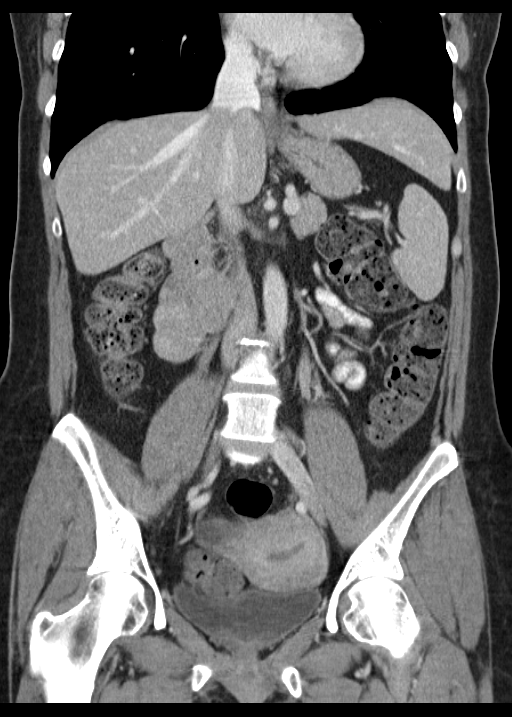

[16 of 46 positions shown; findings below may reference images not displayed]

FINDINGS: Lung bases clear.
Gallbladder surgically absent.
Liver, pancreas, kidneys, and adrenal glands normal appearance.
Spleen minimally prominent in size at 12.3 x 7.5 x 10.6 cm.
No focal splenic lesions.
Tiny umbilical hernia containing fat.
Prominent stool throughout colon.
Small bowel malrotation, with third portion of duodenum failing to
cross midline and multiple proximal small bowel loops clumped in
the right mid abdomen.
This portion of the small bowel is unopacified at time of imaging,
contrast passed beyond the distal small bowel, unable to exclude
wall thickening.
A single loop of small bowel in left mid abdomen shows questionable
wall thickening.

Bladder unremarkable.
Low attenuation within uterus likely related to phase of menses.
Small bilateral ovarian cysts.
Appendix not visualized.
No mass, adenopathy, free fluid or inflammatory process.
No acute osseous abnormalities.
IMPRESSION: Minimal splenic enlargement without focal mass lesion.
Tiny umbilical hernia containing fat.
Malrotation of small bowel, with clumped proximal small bowel loops
in right mid abdomen, suboptimally assessed for wall thickness; a
single loop of small bowel in left mid abdomen shows questionable
nonspecific wall thickening.
Prominent stool throughout colon.

## 2012-03-23 ENCOUNTER — Encounter: Payer: Self-pay | Admitting: Oncology

## 2012-03-24 ENCOUNTER — Other Ambulatory Visit: Payer: Self-pay | Admitting: Oncology

## 2012-03-24 DIAGNOSIS — D693 Immune thrombocytopenic purpura: Secondary | ICD-10-CM

## 2012-03-29 ENCOUNTER — Ambulatory Visit: Payer: BC Managed Care – PPO | Admitting: Oncology

## 2012-03-29 ENCOUNTER — Other Ambulatory Visit: Payer: BC Managed Care – PPO | Admitting: Lab

## 2012-04-01 ENCOUNTER — Other Ambulatory Visit: Payer: Self-pay | Admitting: *Deleted

## 2012-04-04 ENCOUNTER — Other Ambulatory Visit: Payer: Self-pay | Admitting: Oncology

## 2012-04-04 ENCOUNTER — Ambulatory Visit: Payer: BC Managed Care – PPO | Admitting: Cardiology

## 2012-04-04 DIAGNOSIS — Z139 Encounter for screening, unspecified: Secondary | ICD-10-CM

## 2012-04-08 ENCOUNTER — Ambulatory Visit (HOSPITAL_BASED_OUTPATIENT_CLINIC_OR_DEPARTMENT_OTHER): Payer: BC Managed Care – PPO | Admitting: Oncology

## 2012-04-08 ENCOUNTER — Other Ambulatory Visit: Payer: BC Managed Care – PPO | Admitting: Lab

## 2012-04-08 ENCOUNTER — Telehealth: Payer: Self-pay | Admitting: *Deleted

## 2012-04-08 VITALS — BP 148/113 | HR 123 | Temp 98.1°F | Ht 65.0 in | Wt 201.3 lb

## 2012-04-08 DIAGNOSIS — D849 Immunodeficiency, unspecified: Secondary | ICD-10-CM

## 2012-04-08 DIAGNOSIS — G4700 Insomnia, unspecified: Secondary | ICD-10-CM

## 2012-04-08 DIAGNOSIS — D693 Immune thrombocytopenic purpura: Secondary | ICD-10-CM

## 2012-04-08 DIAGNOSIS — Z9081 Acquired absence of spleen: Secondary | ICD-10-CM

## 2012-04-08 DIAGNOSIS — R739 Hyperglycemia, unspecified: Secondary | ICD-10-CM

## 2012-04-08 LAB — CBC WITH DIFFERENTIAL/PLATELET
BASO%: 0.1 % (ref 0.0–2.0)
EOS%: 0.1 % (ref 0.0–7.0)
HCT: 39.1 % (ref 34.8–46.6)
LYMPH%: 9.1 % — ABNORMAL LOW (ref 14.0–49.7)
MCH: 24.9 pg — ABNORMAL LOW (ref 25.1–34.0)
MCHC: 32 g/dL (ref 31.5–36.0)
MCV: 77.7 fL — ABNORMAL LOW (ref 79.5–101.0)
MONO%: 6.6 % (ref 0.0–14.0)
NEUT%: 84.1 % — ABNORMAL HIGH (ref 38.4–76.8)
Platelets: 150 10*3/uL (ref 145–400)
lymph#: 1 10*3/uL (ref 0.9–3.3)

## 2012-04-08 LAB — MORPHOLOGY: PLT EST: ADEQUATE

## 2012-04-08 LAB — COMPREHENSIVE METABOLIC PANEL
AST: 29 U/L (ref 0–37)
Albumin: 3.7 g/dL (ref 3.5–5.2)
Alkaline Phosphatase: 37 U/L — ABNORMAL LOW (ref 39–117)
Potassium: 3.7 mEq/L (ref 3.5–5.3)
Sodium: 139 mEq/L (ref 135–145)
Total Protein: 6 g/dL (ref 6.0–8.3)

## 2012-04-08 MED ORDER — PREDNISONE 5 MG PO TABS: 5.0000 mg | ORAL_TABLET | Freq: Every day | ORAL | Status: AC

## 2012-04-08 MED ORDER — SERTRALINE HCL 50 MG PO TABS: 150.0000 mg | ORAL_TABLET | Freq: Every day | ORAL | Status: DC

## 2012-04-08 MED ORDER — ALPRAZOLAM 0.5 MG PO TABS: ORAL_TABLET | ORAL | Status: DC

## 2012-04-08 NOTE — Telephone Encounter (Signed)
Gave patient appointment for 05-20-2012 starting at 2:30pm

## 2012-04-08 NOTE — Patient Instructions (Addendum)
20 mg po daily then decrease by 5 mg /wk ... 20 mg...15 mg..10 mg.. 5mg ... Every 7 days.Marland Kitchen

## 2012-04-10 NOTE — Progress Notes (Signed)
Hematology and Oncology Follow Up Visit  DELAYSIA PAUP [id number] 15-Apr-1966 46 y.o. 04/10/2012 11:35 PM   DIAGNOSIS:   Encounter Diagnoses  Name Primary?  . Insomnia Yes  . 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   Interim History:  Currently on slow taper of prednisone , she does have intermittent aches and pains. She is on waiver from work because of the symptoms. She has had some issues with chest pain which is been atypical worked up by cardiology.  Medications: I have reviewed the patient's current medications.  Allergies:  Allergies  Allergen Reactions  . Yellow Jacket Venom     Past Medical History, Surgical history, Social history, and Family History were reviewed and updated.  Review of Systems: Constitutional:  Negative for fever, chills, night sweats, anorexia, weight loss, pain. Cardiovascular: no chest pain or dyspnea on exertion Respiratory: negative Neurological: negative Dermatological: negative ENT: negative Skin Gastrointestinal: negative Genito-Urinary: negative Hematological and Lymphatic: negative Breast: negative Musculoskeletal: negative Remaining ROS negative.  Physical Exam:  Blood pressure 148/113, pulse 123, temperature 98.1 F (36.7 C), height 5\' 5"  (1.651 m), weight 201 lb 4.8 oz (91.309 kg).  ECOG: 0  HEENT:  Sclerae anicteric, conjunctivae pink.  Oropharynx clear.  No mucositis or candidiasis.  Nodes:  No cervical, supraclavicular, or axillary lymphadenopathy palpated...  Lungs:  Clear to auscultation bilaterally.  No crackles, rhonchi, or wheezes.  Heart:  Regular rate and rhythm.  Abdomen:  Soft, nontender.  Positive bowel sounds.  No organomegaly or masses palpated.  Musculoskeletal:  No focal spinal tenderness to palpation.  Extremities:  Benign.  No peripheral edema or cyanosis.  Skin:  Benign.  Neuro:  Nonfocal.     Lab Results: Lab  Results  Component Value Date   WBC 11.0* 04/08/2012   HGB 12.5 04/08/2012   HCT 39.1 04/08/2012   MCV 77.7* 04/08/2012   PLT 150 04/08/2012     Chemistry      Component Value Date/Time   NA 139 04/08/2012 1313   K 3.7 04/08/2012 1313   CL 102 04/08/2012 1313   CO2 25 04/08/2012 1313   BUN 36* 04/08/2012 1313   CREATININE 1.44* 04/08/2012 1313      Component Value Date/Time   CALCIUM 9.1 04/08/2012 1313   ALKPHOS 37* 04/08/2012 1313   AST 29 04/08/2012 1313   ALT 52* 04/08/2012 1313   BILITOT 0.4 04/08/2012 1313       Radiological Studies:  No results found.   IMPRESSIONS AND PLAN: A 46 y.o. female with   A history of ITP on slow prednisone taper, currently at 50 mg per day, due to decrease to 40 mg/d. She will notify us if there is any evidence of bleeding or bruising. We will see her in 1 month. Cbc s will be obtained at her home lab. Spent more than half the time coordinating care, as well as discussion of BMI and its implications.      Morgaine Kimball 7/14/201311:35 PM Cell [id number]

## 2012-04-12 DIAGNOSIS — D696 Thrombocytopenia, unspecified: Secondary | ICD-10-CM | POA: Insufficient documentation

## 2012-04-12 DIAGNOSIS — M79604 Pain in right leg: Secondary | ICD-10-CM | POA: Insufficient documentation

## 2012-04-27 ENCOUNTER — Encounter: Payer: Self-pay | Admitting: *Deleted

## 2012-04-27 NOTE — Progress Notes (Unsigned)
Per MD, notified pt to remain on 15mg  of prednisone. Pt will also be enrolled in NPLATE injection therapy

## 2012-04-28 DIAGNOSIS — I2699 Other pulmonary embolism without acute cor pulmonale: Secondary | ICD-10-CM

## 2012-04-28 HISTORY — DX: Other pulmonary embolism without acute cor pulmonale: I26.99

## 2012-05-02 ENCOUNTER — Emergency Department (HOSPITAL_COMMUNITY): Payer: BC Managed Care – PPO

## 2012-05-02 ENCOUNTER — Inpatient Hospital Stay (HOSPITAL_COMMUNITY)
Admission: EM | Admit: 2012-05-02 | Discharge: 2012-05-04 | DRG: 078 | Disposition: A | Discharge status: Home Health | Payer: BC Managed Care – PPO | Attending: Internal Medicine | Admitting: Internal Medicine

## 2012-05-02 ENCOUNTER — Encounter (HOSPITAL_COMMUNITY): Payer: Self-pay | Admitting: *Deleted

## 2012-05-02 ENCOUNTER — Ambulatory Visit: Payer: BC Managed Care – PPO | Admitting: Cardiology

## 2012-05-02 DIAGNOSIS — D6941 Evans syndrome: Secondary | ICD-10-CM | POA: Diagnosis present

## 2012-05-02 DIAGNOSIS — R519 Headache, unspecified: Secondary | ICD-10-CM | POA: Diagnosis present

## 2012-05-02 DIAGNOSIS — E099 Drug or chemical induced diabetes mellitus without complications: Secondary | ICD-10-CM | POA: Diagnosis present

## 2012-05-02 DIAGNOSIS — Z86711 Personal history of pulmonary embolism: Secondary | ICD-10-CM | POA: Diagnosis present

## 2012-05-02 DIAGNOSIS — Y92009 Unspecified place in unspecified non-institutional (private) residence as the place of occurrence of the external cause: Secondary | ICD-10-CM

## 2012-05-02 DIAGNOSIS — T380X5A Adverse effect of glucocorticoids and synthetic analogues, initial encounter: Secondary | ICD-10-CM | POA: Diagnosis present

## 2012-05-02 DIAGNOSIS — E876 Hypokalemia: Secondary | ICD-10-CM

## 2012-05-02 DIAGNOSIS — R739 Hyperglycemia, unspecified: Secondary | ICD-10-CM

## 2012-05-02 DIAGNOSIS — E669 Obesity, unspecified: Secondary | ICD-10-CM

## 2012-05-02 DIAGNOSIS — Z9081 Acquired absence of spleen: Secondary | ICD-10-CM

## 2012-05-02 DIAGNOSIS — N289 Disorder of kidney and ureter, unspecified: Secondary | ICD-10-CM

## 2012-05-02 DIAGNOSIS — IMO0002 Reserved for concepts with insufficient information to code with codable children: Secondary | ICD-10-CM

## 2012-05-02 DIAGNOSIS — G8929 Other chronic pain: Secondary | ICD-10-CM | POA: Diagnosis present

## 2012-05-02 DIAGNOSIS — I2699 Other pulmonary embolism without acute cor pulmonale: Principal | ICD-10-CM

## 2012-05-02 DIAGNOSIS — I10 Essential (primary) hypertension: Secondary | ICD-10-CM

## 2012-05-02 DIAGNOSIS — Z9089 Acquired absence of other organs: Secondary | ICD-10-CM

## 2012-05-02 DIAGNOSIS — D693 Immune thrombocytopenic purpura: Secondary | ICD-10-CM

## 2012-05-02 DIAGNOSIS — E249 Cushing's syndrome, unspecified: Secondary | ICD-10-CM | POA: Diagnosis present

## 2012-05-02 DIAGNOSIS — R000 Tachycardia, unspecified: Secondary | ICD-10-CM

## 2012-05-02 DIAGNOSIS — Z87891 Personal history of nicotine dependence: Secondary | ICD-10-CM

## 2012-05-02 DIAGNOSIS — F32A Depression, unspecified: Secondary | ICD-10-CM

## 2012-05-02 DIAGNOSIS — Z794 Long term (current) use of insulin: Secondary | ICD-10-CM

## 2012-05-02 DIAGNOSIS — R51 Headache: Secondary | ICD-10-CM

## 2012-05-02 DIAGNOSIS — F3289 Other specified depressive episodes: Secondary | ICD-10-CM | POA: Diagnosis present

## 2012-05-02 DIAGNOSIS — E139 Other specified diabetes mellitus without complications: Secondary | ICD-10-CM | POA: Diagnosis present

## 2012-05-02 DIAGNOSIS — F411 Generalized anxiety disorder: Secondary | ICD-10-CM | POA: Diagnosis present

## 2012-05-02 DIAGNOSIS — Z79899 Other long term (current) drug therapy: Secondary | ICD-10-CM

## 2012-05-02 DIAGNOSIS — D849 Immunodeficiency, unspecified: Secondary | ICD-10-CM

## 2012-05-02 DIAGNOSIS — F329 Major depressive disorder, single episode, unspecified: Secondary | ICD-10-CM | POA: Diagnosis present

## 2012-05-02 LAB — CBC WITH DIFFERENTIAL/PLATELET
Basophils Relative: 1 % (ref 0–1)
Eosinophils Relative: 1 % (ref 0–5)
Hemoglobin: 12.3 g/dL (ref 12.0–15.0)
Lymphs Abs: 1.8 10*3/uL (ref 0.7–4.0)
MCV: 80.3 fL (ref 78.0–100.0)
Monocytes Relative: 15 % — ABNORMAL HIGH (ref 3–12)
Platelets: 11 10*3/uL — CL (ref 150–400)
RBC: 4.87 MIL/uL (ref 3.87–5.11)
WBC: 6.8 10*3/uL (ref 4.0–10.5)

## 2012-05-02 LAB — COMPREHENSIVE METABOLIC PANEL
AST: 37 U/L (ref 0–37)
Albumin: 3.3 g/dL — ABNORMAL LOW (ref 3.5–5.2)
Alkaline Phosphatase: 40 U/L (ref 39–117)
BUN: 14 mg/dL (ref 6–23)
Chloride: 100 mEq/L (ref 96–112)
Potassium: 2.6 mEq/L — CL (ref 3.5–5.1)
Total Bilirubin: 0.6 mg/dL (ref 0.3–1.2)

## 2012-05-02 LAB — D-DIMER, QUANTITATIVE: D-Dimer, Quant: 1.08 ug/mL-FEU — ABNORMAL HIGH (ref 0.00–0.48)

## 2012-05-02 MED ORDER — MORPHINE SULFATE 4 MG/ML IJ SOLN
4.0000 mg | Freq: Once | INTRAMUSCULAR | Status: AC
  Administered 2012-05-03: 4 mg via INTRAVENOUS
  Filled 2012-05-02: qty 1

## 2012-05-02 MED ORDER — IOHEXOL 350 MG/ML SOLN
100.0000 mL | Freq: Once | INTRAVENOUS | Status: AC | PRN
  Administered 2012-05-02: 100 mL via INTRAVENOUS

## 2012-05-02 MED ORDER — ONDANSETRON HCL 4 MG/2ML IJ SOLN
4.0000 mg | Freq: Once | INTRAMUSCULAR | Status: AC
  Administered 2012-05-03: 4 mg via INTRAVENOUS
  Filled 2012-05-02: qty 2

## 2012-05-02 MED ORDER — LORAZEPAM 2 MG/ML IJ SOLN
1.0000 mg | Freq: Once | INTRAMUSCULAR | Status: AC
  Administered 2012-05-02: 1 mg via INTRAVENOUS
  Filled 2012-05-02 (×2): qty 1

## 2012-05-02 MED ORDER — MORPHINE SULFATE 4 MG/ML IJ SOLN
4.0000 mg | Freq: Once | INTRAMUSCULAR | Status: AC
  Administered 2012-05-02: 4 mg via INTRAVENOUS
  Filled 2012-05-02: qty 1

## 2012-05-02 MED ORDER — SODIUM CHLORIDE 0.9 % IV BOLUS (SEPSIS)
1000.0000 mL | Freq: Once | INTRAVENOUS | Status: AC
  Administered 2012-05-02: 1000 mL via INTRAVENOUS

## 2012-05-02 NOTE — ED Notes (Signed)
Summer Cook notified of critical platelet and potassium values

## 2012-05-02 NOTE — ED Notes (Signed)
Patient back from xray. No distress. Placed back on CCM. Will continue to monitor.

## 2012-05-02 NOTE — ED Notes (Addendum)
MD at bedside to discuss plan of care. Pain 9\10. Resting on left side. Call bell within reach. Will continue to monitor.

## 2012-05-02 NOTE — ED Provider Notes (Signed)
History     CSN: [id number]  Arrival date & time 05/02/12  2030   First MD Initiated Contact with Patient 05/02/12 2059      Chief Complaint  Patient presents with  . Chest Pain    (Consider location/radiation/quality/duration/timing/severity/associated sxs/prior treatment) HPI Comments: Patient presents with headache, shortness of breath and chest pain for the past 3 days. He states he took a nap yesterday and woke up with a severe headache becoming worse. She had a this in the past but not for a long time. Denies thunderclap onset. She's having left-sided chest pain is worse with deep breathing and a feeling of shortness of breath. She denies any cough or fever. She denies any leg pain or swelling. She has a history of ITP has been treated with prednisone. Dr. Julien Girt last notes states supposed to have 40 mg daily but the patient states she is finished.  The history is provided by the patient.    Past Medical History  Diagnosis Date  . Hypertension   . ITP (idiopathic thrombocytopenic purpura)   . Platelets decreased     Received Cytoxan PO. Last dose 12/11/11  . Depression   . Thrush, oral 05/29/2011  . Evan's syndrome   . Diabetes mellitus     Past Surgical History  Procedure Date  . Cholecystectomy   . Bone marrow biopsy   . Splenectomy, total     Family History  Problem Relation Age of Onset  . Cancer Mother   . Cervical cancer Mother   . Cancer Father   . Lung cancer Father   . Pneumonia Brother     History  Substance Use Topics  . Smoking status: Former Smoker    Quit date: 02/15/2011  . Smokeless tobacco: Never Used  . Alcohol Use: No    OB History    Grav Para Term Preterm Abortions TAB SAB Ect Mult Living                  Review of Systems  Constitutional: Negative for fever, activity change and appetite change.  HENT: Negative for congestion and rhinorrhea.   Respiratory: Positive for chest tightness and shortness of breath. Negative for  cough.   Cardiovascular: Positive for chest pain.  Gastrointestinal: Negative for nausea, vomiting and abdominal pain.  Genitourinary: Negative for dysuria, hematuria, vaginal bleeding and vaginal discharge.  Musculoskeletal: Negative for back pain.  Skin: Negative for rash.  Neurological: Positive for headaches. Negative for dizziness, weakness and light-headedness.    Allergies  Yellow jacket venom  Home Medications   Current Outpatient Rx  Name Route Sig Dispense Refill  . ACYCLOVIR 400 MG PO TABS Oral Take 400 mg by mouth Daily.    . ALBUTEROL SULFATE HFA 108 (90 BASE) MCG/ACT IN AERS Inhalation Inhale into the lungs every 4 (four) hours as needed.    . ALPRAZOLAM 0.5 MG PO TABS  Take one - two tablets, PO, at HS 45 tablet 0  . BAYER CONTOUR TEST VI STRP  Daily.    Marland Kitchen FLUCONAZOLE 100 MG PO TABS Oral Take 100 mg by mouth Daily.    . FUROSEMIDE 20 MG PO TABS Oral Take 20 mg by mouth Daily.    . INSULIN ASPART 100 UNIT/ML [place] SOLN Subcutaneous Inject into the skin 3 (three) times daily before meals. Sliding scale    . POTASSIUM CHLORIDE CRYS ER 20 MEQ PO TBCR Oral Take 20 mg by mouth Daily.    Marland Kitchen PREDNISONE 10 MG PO  TABS  Use as directed in combination with prednisone 20mg   to wean by 10mg  weekly per MD 30 tablet 1  . PREDNISONE 20 MG PO TABS  USE AS DIRECTED 60 tablet 0  . SERTRALINE HCL 50 MG PO TABS Oral Take 3 tablets (150 mg total) by mouth daily. 90 tablet 3  . SULFAMETHOXAZOLE-TMP DS 800-160 MG PO TABS Oral Take 800 mg by mouth Twice daily. Take on m-w-f    . TRIAMTERENE-HCTZ 37.5-25 MG PO CAPS Oral Take 1 capsule by mouth every morning.       BP 142/107  Pulse 141  Temp 98.2 F (36.8 C) (Oral)  Resp 20  Ht 5\' 5"  (1.651 m)  Wt 207 lb (93.895 kg)  BMI 34.45 kg/m2  SpO2 97%  Physical Exam  Constitutional: She is oriented to person, place, and time. She appears well-developed and well-nourished.  HENT:  Head: Normocephalic and atraumatic.  Mouth/Throat: Oropharynx is  clear and moist. No oropharyngeal exudate.  Eyes: Conjunctivae are normal. Pupils are equal, round, and reactive to light.  Neck: Normal range of motion. Neck supple.  Cardiovascular: Normal rate, regular rhythm and normal heart sounds.   No murmur heard.      Tachycardic  Pulmonary/Chest: Effort normal and breath sounds normal. No respiratory distress. She exhibits tenderness.       Reproducible L chest wall tenderness  Abdominal: Soft. There is no tenderness. There is no rebound and no guarding.  Musculoskeletal: Normal range of motion. She exhibits no edema and no tenderness.  Neurological: She is alert and oriented to person, place, and time. No cranial nerve deficit.       Cranial nerves 3-12 intact. 5/5 strength throughout, no facial asymmetry, no ataxia on finger to nose.  Skin: Skin is warm.    ED Course  Procedures (including critical care time)   Labs Reviewed  CBC WITH DIFFERENTIAL  COMPREHENSIVE METABOLIC PANEL  CARDIAC PANEL(CRET KIN+CKTOT+MB+TROPI)  D-DIMER, QUANTITATIVE   No results found.   No diagnosis found.    MDM  Chest pain, SOB, headache without neurological deficit.  Denies thunderclap onset, but concern for bleeding given hx of ITP.  Also concern for PE given tachycardia and pleuritic chest pain.  Labs to evaluate for recurrent ITP, CT head given history of thrombocytopenia, CTPE.  Studies pending at time of sign out to Dr. Eulis Foster who assumes care of patient. Anticipate admission.   Date: 05/02/2012  Rate: 131  Rhythm: sinus tachycardia  QRS Axis: right  Intervals: normal  ST/T Wave abnormalities: normal  Conduction Disutrbances:none  Narrative Interpretation:   Old EKG Reviewed: unchanged         Ezequiel Essex, MD 05/03/12 1249

## 2012-05-02 NOTE — ED Notes (Signed)
Chest pain, sob, headache for 3 days.

## 2012-05-02 NOTE — ED Provider Notes (Signed)
CT Angiogram chest, returned positive for Right  lower lobe PE. Potassium low 2.6. Creatinine 1.23, platelets low, 11,000.  Heart rate improved after IV fluids, to 117. The pressure, normal 143/100. Pulse oxygenation 98% on room air.  Reevaluation: 23:56- she had transient decreased chest pain, with the morphine that was given earlier. The pain is coming back. Repeat morphine, and Zofran were ordered.  I discussed the case with Dr. Marin Olp, hematologist in [place]. He recommended full dose heparin, conversion to Coumadin, and followup in the hematology clinic in one week  Patient is to the hospitalist service  Richarda Blade, MD 05/03/12 [phone]

## 2012-05-03 ENCOUNTER — Encounter (HOSPITAL_COMMUNITY): Payer: Self-pay | Admitting: *Deleted

## 2012-05-03 ENCOUNTER — Inpatient Hospital Stay (HOSPITAL_COMMUNITY): Payer: BC Managed Care – PPO

## 2012-05-03 DIAGNOSIS — I10 Essential (primary) hypertension: Secondary | ICD-10-CM

## 2012-05-03 DIAGNOSIS — E249 Cushing's syndrome, unspecified: Secondary | ICD-10-CM

## 2012-05-03 DIAGNOSIS — I2699 Other pulmonary embolism without acute cor pulmonale: Principal | ICD-10-CM

## 2012-05-03 DIAGNOSIS — Z86711 Personal history of pulmonary embolism: Secondary | ICD-10-CM | POA: Diagnosis present

## 2012-05-03 DIAGNOSIS — D693 Immune thrombocytopenic purpura: Secondary | ICD-10-CM

## 2012-05-03 DIAGNOSIS — T380X5A Adverse effect of glucocorticoids and synthetic analogues, initial encounter: Secondary | ICD-10-CM

## 2012-05-03 LAB — HOMOCYSTEINE: Homocysteine: 9.8 umol/L (ref 4.0–15.4)

## 2012-05-03 LAB — BASIC METABOLIC PANEL
Calcium: 9.4 mg/dL (ref 8.4–10.5)
GFR calc Af Amer: 61 mL/min — ABNORMAL LOW (ref >90)
GFR calc non Af Amer: 53 mL/min — ABNORMAL LOW (ref >90)
Glucose, Bld: 97 mg/dL (ref 70–99)
Potassium: 2.8 mEq/L — ABNORMAL LOW (ref 3.5–5.1)
Sodium: 141 mEq/L (ref 135–145)

## 2012-05-03 LAB — ANTITHROMBIN III: AntiThromb III Func: 104 % (ref 75–120)

## 2012-05-03 LAB — CBC
MCH: 24.9 pg — ABNORMAL LOW (ref 26.0–34.0)
Platelets: 12 10*3/uL — CL (ref 150–400)
RBC: 4.89 MIL/uL (ref 3.87–5.11)

## 2012-05-03 LAB — GLUCOSE, CAPILLARY
Glucose-Capillary: 82 mg/dL (ref 70–99)
Glucose-Capillary: 133 mg/dL — ABNORMAL HIGH (ref 70–99)

## 2012-05-03 LAB — HEMOGLOBIN A1C
Hgb A1c MFr Bld: 7.5 % — ABNORMAL HIGH (ref <5.7)
Mean Plasma Glucose: 169 mg/dL — ABNORMAL HIGH (ref <117)

## 2012-05-03 LAB — PROTIME-INR: Prothrombin Time: 13.8 seconds (ref 11.6–15.2)

## 2012-05-03 LAB — APTT: aPTT: 90 seconds — ABNORMAL HIGH (ref 24–37)

## 2012-05-03 MED ORDER — PREDNISONE 10 MG PO TABS
40.0000 mg | ORAL_TABLET | Freq: Every day | ORAL | Status: DC
  Administered 2012-05-03 – 2012-05-04 (×2): 40 mg via ORAL
  Filled 2012-05-03 (×2): qty 4

## 2012-05-03 MED ORDER — POTASSIUM CHLORIDE CRYS ER 20 MEQ PO TBCR
40.0000 meq | EXTENDED_RELEASE_TABLET | Freq: Two times a day (BID) | ORAL | Status: AC
  Administered 2012-05-03 (×2): 40 meq via ORAL
  Filled 2012-05-03 (×2): qty 2

## 2012-05-03 MED ORDER — MORPHINE SULFATE 2 MG/ML IJ SOLN: 2.0000 mg | INTRAMUSCULAR | Status: DC | PRN

## 2012-05-03 MED ORDER — PATIENT'S GUIDE TO USING COUMADIN BOOK
Freq: Once | Status: AC
  Administered 2012-05-03: 12:00:00
  Filled 2012-05-03: qty 1

## 2012-05-03 MED ORDER — ONDANSETRON HCL 4 MG/2ML IJ SOLN: 4.0000 mg | Freq: Four times a day (QID) | INTRAMUSCULAR | Status: DC | PRN

## 2012-05-03 MED ORDER — ALBUTEROL SULFATE HFA 108 (90 BASE) MCG/ACT IN AERS: 2.0000 | INHALATION_SPRAY | RESPIRATORY_TRACT | Status: DC | PRN

## 2012-05-03 MED ORDER — ALUM & MAG HYDROXIDE-SIMETH 200-200-20 MG/5ML PO SUSP: 30.0000 mL | Freq: Four times a day (QID) | ORAL | Status: DC | PRN

## 2012-05-03 MED ORDER — SODIUM CHLORIDE 0.9 % IJ SOLN
3.0000 mL | Freq: Two times a day (BID) | INTRAMUSCULAR | Status: DC
  Administered 2012-05-03 (×2): 3 mL via INTRAVENOUS
  Filled 2012-05-03 (×2): qty 3

## 2012-05-03 MED ORDER — ACETAMINOPHEN 650 MG RE SUPP: 650.0000 mg | Freq: Four times a day (QID) | RECTAL | Status: DC | PRN

## 2012-05-03 MED ORDER — WARFARIN SODIUM 5 MG PO TABS
5.0000 mg | ORAL_TABLET | Freq: Once | ORAL | Status: AC
  Administered 2012-05-03: 5 mg via ORAL
  Filled 2012-05-03: qty 1

## 2012-05-03 MED ORDER — POLYETHYLENE GLYCOL 3350 17 G PO PACK: 17.0000 g | PACK | Freq: Every day | ORAL | Status: DC | PRN

## 2012-05-03 MED ORDER — WARFARIN VIDEO
Freq: Once | Status: AC
  Administered 2012-05-03: 13:00:00

## 2012-05-03 MED ORDER — ALPRAZOLAM 0.5 MG PO TABS: 0.5000 mg | ORAL_TABLET | Freq: Three times a day (TID) | ORAL | Status: DC | PRN

## 2012-05-03 MED ORDER — FLUCONAZOLE 100 MG PO TABS
100.0000 mg | ORAL_TABLET | Freq: Every day | ORAL | Status: DC
  Administered 2012-05-03 – 2012-05-04 (×2): 100 mg via ORAL
  Filled 2012-05-03 (×2): qty 1

## 2012-05-03 MED ORDER — ONDANSETRON HCL 4 MG PO TABS: 4.0000 mg | ORAL_TABLET | Freq: Four times a day (QID) | ORAL | Status: DC | PRN

## 2012-05-03 MED ORDER — METOPROLOL SUCCINATE ER 50 MG PO TB24
50.0000 mg | ORAL_TABLET | Freq: Every day | ORAL | Status: DC
  Administered 2012-05-03 – 2012-05-04 (×2): 50 mg via ORAL
  Filled 2012-05-03 (×2): qty 1

## 2012-05-03 MED ORDER — ACYCLOVIR 200 MG PO CAPS
400.0000 mg | ORAL_CAPSULE | Freq: Every day | ORAL | Status: DC
  Administered 2012-05-03 – 2012-05-04 (×2): 400 mg via ORAL
  Filled 2012-05-03 (×2): qty 2

## 2012-05-03 MED ORDER — SODIUM CHLORIDE 0.9 % IJ SOLN
INTRAMUSCULAR | Status: AC
  Administered 2012-05-03: 10 mL
  Filled 2012-05-03: qty 3

## 2012-05-03 MED ORDER — HEPARIN BOLUS VIA INFUSION
4000.0000 [IU] | Freq: Once | INTRAVENOUS | Status: AC
  Administered 2012-05-03: 4000 [IU] via INTRAVENOUS
  Filled 2012-05-03: qty 4000

## 2012-05-03 MED ORDER — SERTRALINE HCL 50 MG PO TABS
150.0000 mg | ORAL_TABLET | Freq: Every day | ORAL | Status: DC
  Administered 2012-05-03 – 2012-05-04 (×2): 150 mg via ORAL
  Filled 2012-05-03 (×2): qty 3

## 2012-05-03 MED ORDER — HEPARIN (PORCINE) IN NACL 100-0.45 UNIT/ML-% IJ SOLN
1100.0000 [IU]/h | INTRAMUSCULAR | Status: DC
  Administered 2012-05-03 – 2012-05-04 (×2): 1100 [IU]/h via INTRAVENOUS
  Filled 2012-05-03 (×2): qty 250

## 2012-05-03 MED ORDER — WARFARIN - PHARMACIST DOSING INPATIENT: Freq: Every day | Status: DC

## 2012-05-03 MED ORDER — SULFAMETHOXAZOLE-TMP DS 800-160 MG PO TABS
1.0000 | ORAL_TABLET | Freq: Two times a day (BID) | ORAL | Status: DC
  Administered 2012-05-03 – 2012-05-04 (×4): 1 via ORAL
  Filled 2012-05-03 (×4): qty 1

## 2012-05-03 MED ORDER — ACYCLOVIR 400 MG PO TABS
400.0000 mg | ORAL_TABLET | Freq: Every day | ORAL | Status: DC
  Filled 2012-05-03 (×2): qty 1

## 2012-05-03 MED ORDER — HYDROCODONE-ACETAMINOPHEN 10-325 MG PO TABS
1.0000 | ORAL_TABLET | ORAL | Status: DC | PRN
  Administered 2012-05-03: 1 via ORAL
  Filled 2012-05-03: qty 1

## 2012-05-03 MED ORDER — ACETAMINOPHEN 325 MG PO TABS
650.0000 mg | ORAL_TABLET | Freq: Four times a day (QID) | ORAL | Status: DC | PRN
  Administered 2012-05-03: 650 mg via ORAL
  Filled 2012-05-03: qty 2

## 2012-05-03 MED ORDER — SODIUM CHLORIDE 0.9 % IV SOLN
INTRAVENOUS | Status: DC
  Administered 2012-05-03 – 2012-05-04 (×3): via INTRAVENOUS

## 2012-05-03 MED ORDER — INSULIN ASPART 100 UNIT/ML ~~LOC~~ SOLN
0.0000 [IU] | Freq: Three times a day (TID) | SUBCUTANEOUS | Status: DC
  Administered 2012-05-03: 2 [IU] via SUBCUTANEOUS

## 2012-05-03 MED ORDER — DOCUSATE SODIUM 100 MG PO CAPS
100.0000 mg | ORAL_CAPSULE | Freq: Two times a day (BID) | ORAL | Status: DC
  Administered 2012-05-03 – 2012-05-04 (×3): 100 mg via ORAL
  Filled 2012-05-03 (×3): qty 1

## 2012-05-03 MED ORDER — POTASSIUM CHLORIDE 10 MEQ/100ML IV SOLN
10.0000 meq | INTRAVENOUS | Status: AC
  Administered 2012-05-03 (×3): 10 meq via INTRAVENOUS
  Filled 2012-05-03 (×3): qty 100

## 2012-05-03 NOTE — ED Notes (Signed)
Ambulatory to bathroom with steady gait. No distress at this time. Equal chest rise and fall, regular, unlabored.

## 2012-05-03 NOTE — Consult Note (Signed)
ANTICOAGULATION CONSULT NOTE - Initial Consult  Pharmacy Consult for Heparin and Coumadin Indication: pulmonary embolus  Allergies  Allergen Reactions  . Yellow Jacket Venom    Patient Measurements: Height: 5\' 5"  (165.1 cm) Weight: 207 lb (93.895 kg) IBW/kg (Calculated) : 57   Vital Signs: Temp: 98.7 F (37.1 C) (08/06 0605) Temp src: Oral (08/06 0605) BP: 147/107 mmHg (08/06 0605) Pulse Rate: 109  (08/06 0605)  Labs:  Basename 05/03/12 0949 05/03/12 0527 05/02/12 2212  HGB -- 12.2 12.3  HCT -- 39.0 39.1  PLT -- 12* 11*  APTT -- 90* --  LABPROT -- 13.8 --  INR -- 1.04 --  HEPARINUNFRC 0.63 -- --  CREATININE -- 1.21* 1.23*  CKTOTAL -- -- 36  CKMB -- -- 1.2  TROPONINI -- -- <0.30   Estimated Creatinine Clearance: 65.8 ml/min (by C-G formula based on Cr of 1.21).  Medical History: Past Medical History  Diagnosis Date  . Hypertension   . ITP (idiopathic thrombocytopenic purpura)   . Platelets decreased     Received Cytoxan PO. Last dose 12/11/11  . Depression   . Thrush, oral 05/29/2011  . Evan's syndrome   . Diabetes mellitus    Medications:  Scheduled:    . acyclovir  400 mg Oral Daily  . docusate sodium  100 mg Oral BID  . fluconazole  100 mg Oral Daily  . heparin  4,000 Units Intravenous Once  . insulin aspart  0-15 Units Subcutaneous TID WC  . LORazepam  1 mg Intravenous Once  . metoprolol succinate  50 mg Oral Daily  .  morphine injection  4 mg Intravenous Once  .  morphine injection  4 mg Intravenous Once  . ondansetron (ZOFRAN) IV  4 mg Intravenous Once  . potassium chloride  10 mEq Intravenous Q1 Hr x 3  . potassium chloride  40 mEq Oral BID  . predniSONE  40 mg Oral Q breakfast  . sertraline  150 mg Oral Daily  . sodium chloride  1,000 mL Intravenous Once  . sodium chloride  3 mL Intravenous Q12H  . sodium chloride      . sulfamethoxazole-trimethoprim  1 tablet Oral Q12H  . DISCONTD: acyclovir  400 mg Oral Daily   Assessment: Heparin level  therapeutic.  Pt has ITP and PE.  Heme-onc following and Rx'ing for ITP.  High risk for bleeding complications due to ITP, thrombocytopenia  Goal of Therapy:  Heparin level 0.3-0.7 units/ml INR 2-3 Monitor platelets by anticoagulation protocol: Yes   Plan: Continue Heparin at current rate Coumadin 5mg  po today x 1 INR daily CBC daily Monitor for s/s bleeding  Hart Robinsons A 05/03/2012,10:55 AM

## 2012-05-03 NOTE — ED Notes (Signed)
Sats between 88%-92%. Placed on 2L O2 [place]. Sats increased to 95%. Instructed to take slow, deep breaths in through nose and out through mouth. Denies needs. Will continue to monitor. Family at bedside.

## 2012-05-03 NOTE — H&P (Signed)
[facility] History and Physical  Summer Cook [id number] DOB: Dec 31, 1965 DOA: 05/02/2012  Referring physician: EDP  PCP: [name], MD   Chief Complaint: Dyspnea, Chest Pain  HPI:  This is a 46 yo woman with a complex medical history significant for ITP/Evan's Syndrome with severe thrombocytopenia who has since last year undergone multiple series of treatments that have included high dose glucocorticoids, IVIG, Cytoxan, Rituximab, and splenectomy who recently weaned off steroids. She came into the ED c/o acute onset SOB, left-sided chest pain and a severe HA. Ct of her head was normal with no evidence of bleed but a CT of her chest showed a Right Subsegmental PE. She is tachycardic, but O2 sats abd BP are normal. She has no history of VTE or hypercoagulable state, she is sedentary due to illness, no recent travel, no trauma, she has had bilateral leg pain and swelling for several weeks. She has a critically low platelet count this admission at 11K. She is a afebrile and has a HA which she says is mostly chronic.  Dr. Truddie Coco follows her closely at [facility] for her ITP. The EDP Dr. Eulis Foster contacted the on call heme/onc Dr. Marin Olp who advised starting IV heparin for PE and no acute treatment for platelet count on admission. He also did not think transfer to [facility] was indicated at this time.      Review of Systems:  Review of Systems  Constitutional: Positive for malaise/fatigue. Negative for fever, chills, weight loss and diaphoresis.  HENT: Positive for congestion. Negative for sore throat.   Eyes: Positive for blurred vision. Negative for double vision, photophobia and pain.  Respiratory: Positive for shortness of breath. Negative for cough, hemoptysis, sputum production and wheezing.   Cardiovascular: Positive for chest pain and leg swelling. Negative for palpitations, orthopnea, claudication and PND.  Gastrointestinal: Negative for heartburn, nausea, vomiting and abdominal pain.    Genitourinary: Negative.   Musculoskeletal: Negative.   Skin: Negative.   Neurological: Positive for weakness and headaches. Negative for dizziness, sensory change, speech change and focal weakness.  Endo/Heme/Allergies: Negative.   Psychiatric/Behavioral: Negative for depression, memory loss and substance abuse. The patient is nervous/anxious. The patient does not have insomnia.   All other systems reviewed and are negative.     Past Medical History  Diagnosis Date  . Hypertension   . ITP (idiopathic thrombocytopenic purpura)   . Platelets decreased     Received Cytoxan PO. Last dose 12/11/11  . Depression   . Thrush, oral 05/29/2011  . Evan's syndrome   . Diabetes mellitus    Past Surgical History  Procedure Date  . Cholecystectomy   . Bone marrow biopsy   . Splenectomy, total    Social History:  reports that she quit smoking about 14 months ago. She has never used smokeless tobacco. She reports that she does not drink alcohol or use illicit drugs. Lives at home with her daughter and mother.  Allergies  Allergen Reactions  . Yellow Jacket Venom     Family History  Problem Relation Age of Onset  . Cancer Mother   . Cervical cancer Mother   . Cancer Father   . Lung cancer Father   . Pneumonia Brother     Prior to Admission medications   Medication Sig Start Date End Date Taking? Authorizing Provider  acetaminophen (TYLENOL) 500 MG tablet Take 1,500 mg by mouth as needed.   Yes Historical Provider, MD  acyclovir (ZOVIRAX) 400 MG tablet Take 400 mg by mouth  every morning.  11/09/11  Yes Eston Esters, MD  albuterol (PROVENTIL HFA;VENTOLIN HFA) 108 (90 BASE) MCG/ACT inhaler Inhale into the lungs every 4 (four) hours as needed.   Yes Historical Provider, MD  ALPRAZolam Duanne Moron) 0.5 MG tablet Take 0.5-1 mg by mouth daily as needed. *May take one tablet by mouth during the day if/as needed 04/08/12  Yes Eston Esters, MD  fish oil-omega-3 fatty acids 1000 MG capsule Take 1 g by  mouth every morning.   Yes Historical Provider, MD  fluconazole (DIFLUCAN) 100 MG tablet Take 100 mg by mouth every morning.  11/09/11  Yes Eston Esters, MD  HYDROcodone-acetaminophen [facility]) 10-325 MG per tablet Take 1 tablet by mouth Every 6 hours as needed. 03/14/12  Yes Historical Provider, MD  insulin aspart (NOVOLOG) 100 UNIT/ML injection Inject into the skin 3 (three) times daily before meals. Sliding scale   Yes Historical Provider, MD  metoprolol succinate (TOPROL-XL) 25 MG 24 hr tablet Take 25 mg by mouth every morning.  03/14/12  Yes Historical Provider, MD  sertraline (ZOLOFT) 50 MG tablet Take 150 mg by mouth every morning. 04/08/12  Yes Eston Esters, MD  sulfamethoxazole-trimethoprim (BACTRIM DS) 800-160 MG per tablet Take 800 mg by mouth Twice daily. Take on m-w-f 11/09/11  Yes Eston Esters, MD  triamterene-hydrochlorothiazide (DYAZIDE) 37.5-25 MG per capsule Take 1 capsule by mouth every morning.    Yes Historical Provider, MD   Physical Exam: Filed Vitals:   05/02/12 2035 05/02/12 2213 05/02/12 2321 05/03/12 0015  BP: 142/107 149/109 143/100 143/105  Pulse: 141 112 117 110  Temp: 98.2 F (36.8 C)     TempSrc: Oral     Resp: 20 20 20    Height:      Weight:      SpO2: 97% 97% 98% 92%     General:  NAD, ruddy complexion, mild facial edema  Eyes: PERRL  ENT: normal, no thrush  Neck: mild ttp, reduced ROM, MSK spasm  Cardiovascular: Tachy, regular no mrg  Respiratory: CTAB  Abdomen: S,NT, ND  Skin: erythroderma, no lesions  Musculoskeletal: normal no deformities, mild ttp on LE exam, no pitting edema  Psychiatric: normal affect  Neurologic: non-focal  Labs on Admission:  Basic Metabolic Panel:  Lab 99991111 2212  NA 138  K 2.6*  CL 100  CO2 27  GLUCOSE 95  BUN 14  CREATININE 1.23*  CALCIUM 9.6  MG --  PHOS --   Liver Function Tests:  Lab 05/02/12 2212  AST 37  ALT 47*  ALKPHOS 40  BILITOT 0.6  PROT 5.9*  ALBUMIN 3.3*   CBC:  Lab 05/02/12  2212  WBC 6.8  NEUTROABS 3.8  HGB 12.3  HCT 39.1  MCV 80.3  PLT 11*   Cardiac Enzymes:  Lab 05/02/12 2212  CKTOTAL 36  CKMB 1.2  CKMBINDEX --  TROPONINI <0.30    BNP (last 3 results)  Basename 06/05/11 1518 06/05/11 0639  PROBNP 261.8* 155.0*     Radiological Exams on Admission: Dg Chest 2 View  05/02/2012  *RADIOLOGY REPORT*  Clinical Data: Chest pain, shortness of breath, hypertension, diabetes  CHEST - 2 VIEW  Comparison:  06/08/2011, 02/03/2011  Findings:  The heart size and mediastinal contours are within normal limits.  Both lungs are clear.  The visualized skeletal structures are unremarkable.  IMPRESSION: No active cardiopulmonary disease.  Original Report Authenticated By: Jerilynn Mages. Daryll Brod, M.D.   Ct Head Wo Contrast  05/02/2012  *RADIOLOGY REPORT*  Clinical Data:  Headaches, diabetes  CT HEAD WITHOUT CONTRAST  Technique:  Contiguous axial images were obtained from the base of the skull through the vertex without contrast  Comparison:  12/18/2011  Findings:  The brain has a normal appearance without evidence for hemorrhage, acute infarction, hydrocephalus, or mass lesion.  There is no extra axial fluid collection.  The skull and paranasal sinuses are normal.  IMPRESSION: Normal CT of the head without contrast.  Original Report Authenticated By: Jerilynn Mages. Daryll Brod, M.D.   Ct Angio Chest W/cm &/or Wo Cm  05/02/2012  *RADIOLOGY REPORT*  Clinical Data: Chest pain for 3 days.  Elevated D-dimer.  CT ANGIOGRAPHY CHEST  Technique:  Multidetector CT imaging of the chest using the standard protocol during bolus administration of intravenous contrast. Multiplanar reconstructed images including MIPs were obtained and reviewed to evaluate the vascular anatomy.  Contrast: 191mL OMNIPAQUE IOHEXOL 350 MG/ML SOLN  Comparison: 02/03/2011  Findings: Technically adequate study with good opacification of the central and segmental pulmonary arteries.  Multiple filling defects demonstrated and segmental  lower lobe branches of the right lung consistent with acute pulmonary embolus.  No central emboli are visualized.  Normal heart size.  Normal caliber thoracic aorta.  No significant lymphadenopathy in the chest.  Esophagus is decompressed.  No pleural effusions.  Respiratory motion artifact limits visualization of the lung fields but no obvious consolidation or interstitial changes are identified.  No pneumothorax.  Normal alignment of the thoracic vertebrae.  IMPRESSION: Positive study for right lower lobe segmental pulmonary emboli.  Critical Value/emergent results were called by telephone at the time of interpretation on 05/02/2012 at 2231 hours to Dr. Wyvonnia Dusky, who verbally acknowledged these results.  Original Report Authenticated By: Neale Burly, M.D.    EKG: Independently reviewed. No RHS.  Assessment/Plan Principal Problem:  *PE (pulmonary embolism) Active Problems:  ITP (idiopathic thrombocytopenic purpura)  Hypertension  Tachycardia  Hypokalemia  Drug-induced hyperglycemia  Immunosuppressed status  Headache  Renal insufficiency   1. Pulmonary Embolism in the setting of known ITP, no prior thrombotic events, likely related. Per discussion with Dr. Marin Olp and Eulis Foster, will start her on IV Heparin, transition to Coumadin, plts are 11 so she is significant bleed risk, will monitor Hb closely.?source of PE, may be LE and worthwhile to check for additional clot burden. 2. ITP, severe thrombocytopenia, she clearly needs treatment for 11K count, but has significant side effects (cataracts, edema, cushingoid features) from long term and high dose steroids, she tells me she responded very well to IVIG in past. Will need to contact Dr. Truddie Coco in AM for definitive management. 3. Renal Insufficiency, Scr 1.2 this is about her baseline, will monitor. 4. Tachycardia, possibly worse from PE. Will check TSH and increase her Toprol XL to 50mg . 5. Hyperglycemia, drug induced DM, SSI, monitor  CBGs. 6. Hypokalemia, etiology unknown, was on Triamterene, may have RTA with her Renal Insufficiency. Will replete and check a Mag level. 7. Headache, mostly acute on chronic, may be medication rebound HA, she takes daily hydrocodone for leg pain and HA, no evidence of bleed on CT. She does not appear to be severely ill or have signs of infection, but she does has some neck pain and is immune compromised so she may need an LP if it does not improve or she gets worse.  8. Immune-supressed, she is on acyclovir, fluconazole, and bactrim for viral and bacterial infection prophylaxis, continued these from her home med list- per Dr. Truddie Coco. 9. Anxiety/Depression, continued her alprazolam.  Code Status: Full Code Family Communication: Discussd plan of care with patient, her daughter, and her mother at the bedside. Disposition Plan: Home when medically stable  Time spent: 27min  Yalanda Soderman [facility] Pager [phone]  If 7PM-7AM, please contact night-coverage [email or web address] Password [email or web address] 05/03/2012, 1:50 AM

## 2012-05-03 NOTE — ED Notes (Signed)
Pain 7\10 at this time. Denies nausea. Resting sitting up in bed. Will continue to monitor. Normal sinus on monitor. Family at bedside.

## 2012-05-03 NOTE — Consult Note (Signed)
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  She then transferred her care to [facility] under the care of Dr. Eston Esters.    She presented to the ED with headache, shortness of breath and chest pain for the past 3 days.  Part of the work up in the ED was a CT angio of chest and CT of Head.  CT of head is negative and CT angiogram of chest reveals a PE.  CT of head was performed secondary to her headaches her her appreciable thrombocytopenia of 11,000.  The patient was thus admitted for anticoagulation.  The ED physician contacted Dr. Marin Olp who advised starting IV heparin for PE.  Transfer to [facility] was not indicated per note.  No acute treatment for platelet count at this time.   I was asked to see the patient today with recommendations from her primary hematologist Dr. Truddie Coco.    Discussion with Dr. Truddie Coco and presentation of the patient's case, he recommended starting N-plate once approved.  This can be started as an inpatient once approved.  He faxed the paperwork for the patient to fill out and this was completed and signed by the patient without any undue influence.  We discussed the risks, benefits, side effects, and alternative to this medication.  She signed the consent.  He also recommended anticoagulation with heparin per pharmacy protocol with transition to oral Coumadin.  She may be discharged at the Hospitalist's discretion and the patient is to follow-up with Dr. Truddie Coco.  He also recommended the continuation of prednisone at the dose of 40 mg daily, which she is on. If any questions, please contact Dr. Eston Esters [facility]) or on-call  hematologist.   PAST MEDICAL HISTORY:   Past Medical History  Diagnosis Date  . Hypertension   . ITP (idiopathic thrombocytopenic purpura)   . Platelets decreased     Received Cytoxan PO. Last dose 12/11/11  . Depression   . Thrush, oral 05/29/2011  . Evan's syndrome   . Diabetes mellitus     ALLERGIES: Allergies  Allergen Reactions  . [place]et Venom       MEDICATIONS: I have reviewed the patient's current medications.     PAST SURGICAL HISTORY Past Surgical History  Procedure Date  . Cholecystectomy   . Bone marrow biopsy   . Splenectomy, total     FAMILY HISTORY: Family History  Problem Relation Age of Onset  . Cancer Mother   . Cervical cancer Mother   . Cancer Father   . Lung cancer Father   . Pneumonia Brother     SOCIAL HISTORY:  reports that she quit smoking about 14 months ago. She has never used smokeless tobacco. She reports that she does not drink alcohol or use illicit drugs.  PERFORMANCE STATUS: The patient's performance status is 2 - Symptomatic, <50% confined to bed  PHYSICAL EXAM: Most Recent Vital Signs: Blood pressure 147/107, pulse 109, temperature 98.7 F (37.1 C), temperature source Oral, resp. rate 18, height 5\' 5"  (1.651 m), weight 207 lb (93.895 kg), SpO2 92.00%. General appearance: alert, cooperative, appears older than stated age, no distress,  moderately obese and cushingoid Head: Normocephalic, without obvious abnormality, atraumatic, cushingoid Eyes: negative findings: lids and lashes normal, conjunctivae and sclerae normal, corneas clear and pupils equal, round, reactive to light and accomodation Throat: lips, mucosa, and tongue normal; teeth and gums normal and no petechiae noted on palate.  Neck: supple, symmetrical, trachea midline and thich neck Back: symmetric, no curvature. ROM normal. No CVA tenderness. Lungs: clear to auscultation bilaterally Heart: regular rate and rhythm, S1, S2 normal, no murmur, click, rub or  gallop Abdomen: soft, non-tender; bowel sounds normal Extremities: extremities normal, atraumatic, no cyanosis or edema and no tibial petechiae Pulses: 2+ and symmetric Skin: Skin color, texture, turgor normal. No rashes or lesions.  No petechial rash noted.  Neurologic: Grossly normal  LABORATORY DATA:  Results for orders placed during the hospital encounter of 05/02/12 (from the past 48 hour(s))  CBC WITH DIFFERENTIAL     Status: Abnormal   Collection Time   05/02/12 10:12 PM      Component Value Range Comment   WBC 6.8  4.0 - 10.5 K/uL    RBC 4.87  3.87 - 5.11 MIL/uL    Hemoglobin 12.3  12.0 - 15.0 g/dL    HCT 39.1  36.0 - 46.0 %    MCV 80.3  78.0 - 100.0 fL    MCH 25.3 (*) 26.0 - 34.0 pg    MCHC 31.5  30.0 - 36.0 g/dL    RDW 18.7 (*) 11.5 - 15.5 %    Platelets 11 (*) 150 - 400 K/uL    Neutrophils Relative 56  43 - 77 %    Lymphocytes Relative 27  12 - 46 %    Monocytes Relative 15 (*) 3 - 12 %    Eosinophils Relative 1  0 - 5 %    Basophils Relative 1  0 - 1 %    Neutro Abs 3.8  1.7 - 7.7 K/uL    Lymphs Abs 1.8  0.7 - 4.0 K/uL    Monocytes Absolute 1.0  0.1 - 1.0 K/uL    Eosinophils Absolute 0.1  0.0 - 0.7 K/uL    Basophils Absolute 0.1  0.0 - 0.1 K/uL    RBC Morphology SCHISTOCYTES PRESENT (2-5/hpf)      Smear Review LARGE PLATELETS PRESENT   PLATELET COUNT CONFIRMED BY SMEAR  COMPREHENSIVE METABOLIC PANEL     Status: Abnormal   Collection Time   05/02/12 10:12 PM      Component Value Range Comment   Sodium 138  135 - 145 mEq/L    Potassium 2.6 (*) 3.5 - 5.1 mEq/L    Chloride 100  96 - 112 mEq/L    CO2 27  19 - 32 mEq/L    Glucose, Bld 95  70 - 99 mg/dL    BUN 14  6 - 23 mg/dL    Creatinine, Ser 1.23 (*) 0.50 - 1.10 mg/dL    Calcium 9.6  8.4 - 10.5 mg/dL    Total Protein 5.9 (*) 6.0 - 8.3 g/dL    Albumin 3.3 (*) 3.5 - 5.2 g/dL    AST 37  0 - 37 U/L    ALT 47 (*) 0 - 35 U/L    Alkaline Phosphatase 40  39 - 117 U/L    Total Bilirubin 0.6  0.3 - 1.2 mg/dL    GFR calc  non Af Amer 52 (*) >90 mL/min    GFR calc Af Amer 60 (*) >90 mL/min   CARDIAC PANEL(CRET KIN+CKTOT+MB+TROPI)  Status: Normal   Collection Time   05/02/12 10:12 PM      Component Value Range Comment   Total CK 36  7 - 177 U/L    CK, MB 1.2  0.3 - 4.0 ng/mL    Troponin I <0.30  <0.30 ng/mL    Relative Index RELATIVE INDEX IS INVALID  0.0 - 2.5   D-DIMER, QUANTITATIVE     Status: Abnormal   Collection Time   05/02/12 10:12 PM      Component Value Range Comment   D-Dimer, Quant 1.08 (*) 0.00 - 0.48 ug/mL-FEU   BASIC METABOLIC PANEL     Status: Abnormal   Collection Time   05/03/12  5:27 AM      Component Value Range Comment   Sodium 141  135 - 145 mEq/L    Potassium 2.8 (*) 3.5 - 5.1 mEq/L    Chloride 101  96 - 112 mEq/L    CO2 27  19 - 32 mEq/L    Glucose, Bld 97  70 - 99 mg/dL    BUN 11  6 - 23 mg/dL    Creatinine, Ser 1.21 (*) 0.50 - 1.10 mg/dL    Calcium 9.4  8.4 - 10.5 mg/dL    GFR calc non Af Amer 53 (*) >90 mL/min    GFR calc Af Amer 61 (*) >90 mL/min   CBC     Status: Abnormal   Collection Time   05/03/12  5:27 AM      Component Value Range Comment   WBC 5.9  4.0 - 10.5 K/uL    RBC 4.89  3.87 - 5.11 MIL/uL    Hemoglobin 12.2  12.0 - 15.0 g/dL    HCT 39.0  36.0 - 46.0 %    MCV 79.8  78.0 - 100.0 fL    MCH 24.9 (*) 26.0 - 34.0 pg    MCHC 31.3  30.0 - 36.0 g/dL    RDW 18.9 (*) 11.5 - 15.5 %    Platelets 12 (*) 150 - 400 K/uL   APTT     Status: Abnormal   Collection Time   05/03/12  5:27 AM      Component Value Range Comment   aPTT 90 (*) 24 - 37 seconds   PROTIME-INR     Status: Normal   Collection Time   05/03/12  5:27 AM      Component Value Range Comment   Prothrombin Time 13.8  11.6 - 15.2 seconds    INR 1.04  0.00 - 1.49   MAGNESIUM     Status: Normal   Collection Time   05/03/12  5:27 AM      Component Value Range Comment   Magnesium 1.8  1.5 - 2.5 mg/dL   GLUCOSE, CAPILLARY     Status: Normal   Collection Time   05/03/12  7:23 AM      Component Value Range  Comment   Glucose-Capillary 94  70 - 99 mg/dL   HEPARIN LEVEL (UNFRACTIONATED)     Status: Normal   Collection Time   05/03/12  9:49 AM      Component Value Range Comment   Heparin Unfractionated 0.63  0.30 - 0.70 IU/mL   GLUCOSE, CAPILLARY     Status: Normal   Collection Time   05/03/12 11:25 AM      Component Value Range Comment   Glucose-Capillary 82  70 - 99 mg/dL       RADIOGRAPHY: Dg Chest 2 View  05/02/2012  *RADIOLOGY REPORT*  Clinical Data: Chest pain, shortness of breath, hypertension, diabetes  CHEST - 2 VIEW  Comparison:  06/08/2011, 02/03/2011  Findings:  The heart size and mediastinal contours are within normal limits.  Both lungs are clear.  The visualized skeletal structures are unremarkable.  IMPRESSION: No active cardiopulmonary disease.  Original Report Authenticated By: Jerilynn Mages. Daryll Brod, M.D.   Ct Head Wo Contrast  05/02/2012  *RADIOLOGY REPORT*  Clinical Data:  Headaches, diabetes  CT HEAD WITHOUT CONTRAST  Technique:  Contiguous axial images were obtained from the base of the skull through the vertex without contrast  Comparison:  12/18/2011  Findings:  The brain has a normal appearance without evidence for hemorrhage, acute infarction, hydrocephalus, or mass lesion.  There is no extra axial fluid collection.  The skull and paranasal sinuses are normal.  IMPRESSION: Normal CT of the head without contrast.  Original Report Authenticated By: Jerilynn Mages. Daryll Brod, M.D.   Ct Angio Chest W/cm &/or Wo Cm  05/02/2012  *RADIOLOGY REPORT*  Clinical Data: Chest pain for 3 days.  Elevated D-dimer.  CT ANGIOGRAPHY CHEST  Technique:  Multidetector CT imaging of the chest using the standard protocol during bolus administration of intravenous contrast. Multiplanar reconstructed images including MIPs were obtained and reviewed to evaluate the vascular anatomy.  Contrast: 153mL OMNIPAQUE IOHEXOL 350 MG/ML SOLN  Comparison: 02/03/2011  Findings: Technically adequate study with good opacification of the  central and segmental pulmonary arteries.  Multiple filling defects demonstrated and segmental lower lobe branches of the right lung consistent with acute pulmonary embolus.  No central emboli are visualized.  Normal heart size.  Normal caliber thoracic aorta.  No significant lymphadenopathy in the chest.  Esophagus is decompressed.  No pleural effusions.  Respiratory motion artifact limits visualization of the lung fields but no obvious consolidation or interstitial changes are identified.  No pneumothorax.  Normal alignment of the thoracic vertebrae.  IMPRESSION: Positive study for right lower lobe segmental pulmonary emboli.  Critical Value/emergent results were called by telephone at the time of interpretation on 05/02/2012 at 2231 hours to Dr. Wyvonnia Dusky, who verbally acknowledged these results.  Original Report Authenticated By: Neale Burly, M.D.       ASSESSMENT:  1. Newly diagnosed PE, on IV heparin. 2. Refractory ITP, platelet count 12,000.  On PO Prednisone 40 mg daily. S/P various course of steroids (Prednisone and Decadron pulses), IVIg, Rituxan, Cytoxan.  Followed by Dr. Eston Esters [facility]) 3. 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   PLAN:  1. Per discussion with Dr. Truddie Coco, he agrees with IV heparin by pharmacy protocol and transitioning to PO Coumadin.  While anticoagulated, he wants her to be placed on N-plate.  Paperwork for patient to fill out and patient education regarding N-plate discussed, a copy was given to her, and a copy was placed in paper chart.  We discussed the risks, benefits, alternatives, and side effects of the medication.  The patient signed the paperwork and we will submit this information.  This can be started as an inpatient once approved. Continue 40 mg PO Prednisone daily.  She may be discharged at the Hospitalist's discretion and the patient is to follow-up with Dr. Truddie Coco.  If any questions, please contact Dr. Eston Esters [facility], [phone]) or on-call  hematologist.   All questions were answered. The patient knows to call the clinic with any problems, questions or concerns. We can certainly see the patient much sooner if necessary.  The patient and plan discussed with  Eston Esters, MD, FRCPC and he is in agreement with the aforementioned.  Demarkus Remmel

## 2012-05-03 NOTE — Progress Notes (Signed)
Utilization Review Complete  

## 2012-05-03 NOTE — ED Notes (Signed)
Remains resting in bed on left side. No distress. Pain 5\10. Equal chest rise and fall, regular, unlabored. Call bell within reach. Will continue to monitor.

## 2012-05-03 NOTE — Care Management Note (Signed)
    Page 1 of 1   05/04/2012     10:00:04 AM   CARE MANAGEMENT NOTE 05/04/2012  Patient:  AMBERA, CONK   Account Number:  [id number]  Date Initiated:  05/03/2012  Documentation initiated by:  Claretha Cooper  Subjective/Objective Assessment:   Pt admitted from home with her 72. She is admitted with pulmunary embolus.  Pt also is debilitated with ITP.     Action/Plan:   Spoke to pt. She may need HHRN for blood work that would be called to her PCP since she will be on anticoagulant for months. She is concerned about medication costs and drug cards given to her.   Anticipated DC Date:  05/06/2012   Anticipated DC Plan:  [place]  CM consult      Choice offered to / List presented to:          [facility] arranged  HH-1 RN  [place].   Status of service:  Completed, signed off Medicare Important Message given?   (If response is "NO", the following Medicare IM given date fields will be blank) Date Medicare IM given:   Date Additional Medicare IM given:    Discharge Disposition:  [place]  Per UR Regulation:    If discussed at Long Length of Stay Meetings, dates discussed:    Comments:  05/04/12 [place] RN BSN CM Watching [place] video, to go home with RN [place] with [facility].  05/03/12 [place] BSN CM

## 2012-05-03 NOTE — Progress Notes (Signed)
Went in to patient's room to do coumadin teaching and patient stated she wants to wait until her family is present. Patient stated she'll let me know when they get here. Patient denies any other needs. Call light in reach. Will continue to monitor.

## 2012-05-03 NOTE — ED Notes (Signed)
Diet Coke given per request. Hospitalist at bedside with patient. No distress. Pain 7\10. Will continue to monitor.

## 2012-05-03 NOTE — Progress Notes (Signed)
Subjective: This lady was admitted with a small right-sided PE. She does describe pleuritic chest pain. She also describes swelling of both her legs. She has ITP and her platelet count is extremely low.           Physical Exam: Blood pressure 147/107, pulse 109, temperature 98.7 F (37.1 C), temperature source Oral, resp. rate 18, height 5\' 5"  (1.651 m), weight 93.895 kg (207 lb), SpO2 92.00%. She looks systemically well. Heart sounds are present and normal. Lung fields are clear. There is no pleural rub. She is alert and orientated.   Investigations:  No results found for this or any previous visit (from the past 240 hour(s)).   Basic Metabolic Panel:  Basename 05/03/12 0527 05/02/12 2212  NA 141 138  K 2.8* 2.6*  CL 101 100  CO2 27 27  GLUCOSE 97 95  BUN 11 14  CREATININE 1.21* 1.23*  CALCIUM 9.4 9.6  MG 1.8 --  PHOS -- --   Liver Function Tests:  [facility] 05/02/12 2212  AST 37  ALT 47*  ALKPHOS 40  BILITOT 0.6  PROT 5.9*  ALBUMIN 3.3*     CBC:  Basename 05/03/12 0527 05/02/12 2212  WBC 5.9 6.8  NEUTROABS -- 3.8  HGB 12.2 12.3  HCT 39.0 39.1  MCV 79.8 80.3  PLT 12* 11*    Dg Chest 2 View  05/02/2012  *RADIOLOGY REPORT*  Clinical Data: Chest pain, shortness of breath, hypertension, diabetes  CHEST - 2 VIEW  Comparison:  06/08/2011, 02/03/2011  Findings:  The heart size and mediastinal contours are within normal limits.  Both lungs are clear.  The visualized skeletal structures are unremarkable.  IMPRESSION: No active cardiopulmonary disease.  Original Report Authenticated By: Jerilynn Mages. Daryll Brod, M.D.   Ct Head Wo Contrast  05/02/2012  *RADIOLOGY REPORT*  Clinical Data:  Headaches, diabetes  CT HEAD WITHOUT CONTRAST  Technique:  Contiguous axial images were obtained from the base of the skull through the vertex without contrast  Comparison:  12/18/2011  Findings:  The brain has a normal appearance without evidence for hemorrhage, acute infarction,  hydrocephalus, or mass lesion.  There is no extra axial fluid collection.  The skull and paranasal sinuses are normal.  IMPRESSION: Normal CT of the head without contrast.  Original Report Authenticated By: Jerilynn Mages. Daryll Brod, M.D.   Ct Angio Chest W/cm &/or Wo Cm  05/02/2012  *RADIOLOGY REPORT*  Clinical Data: Chest pain for 3 days.  Elevated D-dimer.  CT ANGIOGRAPHY CHEST  Technique:  Multidetector CT imaging of the chest using the standard protocol during bolus administration of intravenous contrast. Multiplanar reconstructed images including MIPs were obtained and reviewed to evaluate the vascular anatomy.  Contrast: 177mL OMNIPAQUE IOHEXOL 350 MG/ML SOLN  Comparison: 02/03/2011  Findings: Technically adequate study with good opacification of the central and segmental pulmonary arteries.  Multiple filling defects demonstrated and segmental lower lobe branches of the right lung consistent with acute pulmonary embolus.  No central emboli are visualized.  Normal heart size.  Normal caliber thoracic aorta.  No significant lymphadenopathy in the chest.  Esophagus is decompressed.  No pleural effusions.  Respiratory motion artifact limits visualization of the lung fields but no obvious consolidation or interstitial changes are identified.  No pneumothorax.  Normal alignment of the thoracic vertebrae.  IMPRESSION: Positive study for right lower lobe segmental pulmonary emboli.  Critical Value/emergent results were called by telephone at the time of interpretation on 05/02/2012 at 2231 hours to Dr.  Rancour, who verbally acknowledged these results.  Original Report Authenticated By: Neale Burly, M.D.      Medications: I have reviewed the patient's current medications.  Impression: 1. Acute pulmonary embolism. 2. ITP, patient discontinued her steroid taper and is not taking any steroids at all. 3. Hypertension.      Plan: 1. Continue with anticoagulation with intravenous heparin and start oral  warfarin per pharmacy. 2. Oncology consultation. I feel slightly uncomfortable with anticoagulation in the face of thrombocytopenia to this degree. 3. Restart prednisone at 40 mg daily.     LOS: 1 day   Doree Albee Pager [phone]  05/03/2012, 10:38 AM

## 2012-05-03 NOTE — ED Notes (Signed)
Report called to Mallie Darting, RN

## 2012-05-04 ENCOUNTER — Other Ambulatory Visit: Payer: Self-pay | Admitting: Oncology

## 2012-05-04 ENCOUNTER — Encounter: Payer: Self-pay | Admitting: *Deleted

## 2012-05-04 DIAGNOSIS — E669 Obesity, unspecified: Secondary | ICD-10-CM

## 2012-05-04 LAB — CARDIOLIPIN ANTIBODIES, IGG, IGM, IGA
Anticardiolipin IgA: 2 APL U/mL — ABNORMAL LOW (ref <22)
Anticardiolipin IgM: 1 MPL U/mL — ABNORMAL LOW (ref <11)

## 2012-05-04 LAB — COMPREHENSIVE METABOLIC PANEL
ALT: 48 U/L — ABNORMAL HIGH (ref 0–35)
AST: 36 U/L (ref 0–37)
Albumin: 3.3 g/dL — ABNORMAL LOW (ref 3.5–5.2)
Alkaline Phosphatase: 39 U/L (ref 39–117)
Chloride: 109 mEq/L (ref 96–112)
Potassium: 4 mEq/L (ref 3.5–5.1)
Sodium: 141 mEq/L (ref 135–145)
Total Protein: 5.9 g/dL — ABNORMAL LOW (ref 6.0–8.3)

## 2012-05-04 LAB — CBC
Platelets: 35 10*3/uL — ABNORMAL LOW (ref 150–400)
RBC: 4.77 MIL/uL (ref 3.87–5.11)
RDW: 18.9 % — ABNORMAL HIGH (ref 11.5–15.5)
WBC: 9.9 10*3/uL (ref 4.0–10.5)

## 2012-05-04 LAB — GLUCOSE, CAPILLARY
Glucose-Capillary: 90 mg/dL (ref 70–99)
Glucose-Capillary: 86 mg/dL (ref 70–99)

## 2012-05-04 LAB — PROTEIN S, TOTAL: Protein S Ag, Total: 114 % (ref 60–150)

## 2012-05-04 LAB — PROTIME-INR: Prothrombin Time: 13.7 seconds (ref 11.6–15.2)

## 2012-05-04 LAB — FACTOR 5 LEIDEN

## 2012-05-04 MED ORDER — ENOXAPARIN (LOVENOX) PATIENT EDUCATION KIT
PACK | Freq: Once | Status: AC
  Administered 2012-05-04: 08:00:00
  Filled 2012-05-04: qty 1

## 2012-05-04 MED ORDER — PREDNISONE 20 MG PO TABS: 40.0000 mg | ORAL_TABLET | Freq: Every day | ORAL | Status: DC

## 2012-05-04 MED ORDER — ENOXAPARIN SODIUM 150 MG/ML ~~LOC~~ SOLN: 1.5000 mg/kg | SUBCUTANEOUS | Status: DC

## 2012-05-04 MED ORDER — ENOXAPARIN SODIUM 150 MG/ML ~~LOC~~ SOLN
1.5000 mg/kg | SUBCUTANEOUS | Status: DC
  Administered 2012-05-04: 140 mg via SUBCUTANEOUS
  Filled 2012-05-04: qty 1

## 2012-05-04 MED ORDER — WARFARIN SODIUM 5 MG PO TABS: 5.0000 mg | ORAL_TABLET | Freq: Every day | ORAL | Status: DC

## 2012-05-04 NOTE — Progress Notes (Signed)
Is a patient of mine and has a long-standing history of ITP. She is currently refractory to most therapies except for prednisone. She had a benign prednisone taper and platelets had a gradually fallen. She was 50 mg of prednisone daily when her platelets were in the 30,000 range. She does admit to [facility] and was found to have pulmonary embolism. She is now on IV heparin. I have actually not seen this patient but did discuss this with TPA who had seen her. She currently is on a higher dose of prednisone and the platelets have risen. Despite this I am little concerned over been on long-term anticoagulation. I have suggested she be started on endplate. Arrangements are being made for you have this drug provided for her. Once again I think she is at risk for ongoing problems related to low platelets and chronic anticoagulation. One of my suggestions is to consider IVC  filter placement when her platelets have improved. I plan to see this patient as an outpatient in my office when she is discharged from hospital.   Thomasene Ripple M.D. FRCP C.

## 2012-05-04 NOTE — Progress Notes (Signed)
Nurse entered pt's room.  Pt had removed IV from left wrist.  The site was intact and not bleeding and had been covered with gauze and tape.

## 2012-05-04 NOTE — Discharge Summary (Signed)
Physician Discharge Summary  Summer Cook [id number] DOB: December 18, 1965 DOA: 05/02/2012  PCP: [name], MD  Admit date: 05/02/2012 Discharge date: 05/04/2012  Recommendations for Outpatient Follow-up:  1. Followup with patient's oncologist/hematologist, Dr. Truddie Coco.   Discharge Diagnoses:  1. Right lower lobe pulmonary embolus. 2. ITP. 3. Hypertension.    Discharge Condition: Stable and improved.  Diet recommendation: Regular.  Wt Readings from Last 3 Encounters:  05/03/12 93.895 kg (207 lb)  04/08/12 91.309 kg (201 lb 4.8 oz)  12/24/11 87.544 kg (193 lb)    History of present illness:  This 46 year old lady came to the hospital with symptoms of dyspnea and chest pain. She has a history of ITP.  Hospital Course:  In the emergency room, a CT scan of her chest confirmed the presence of a right subsegmental pulmonary embolus. She was admitted to the hospital and started on intravenous heparin, and after discussion with her hematologist/oncologist, Dr. Truddie Coco. She has done well and wishes to go home now. She will need to be transitioned to Lovenox subcutaneously temperature until her INR is therapeutic. She does not have any chest pain, dyspnea today. Venous Dopplers of both legs were negative for DVT. Procedures:  None.  Consultations:  Hematology/oncology, Dr. [name].  Discharge Exam: Filed Vitals:   05/04/12 0448  BP: 123/85  Pulse: 80  Temp: 97.6 F (36.4 C)  Resp: 20   Filed Vitals:   05/03/12 1727 05/03/12 1938 05/03/12 2051 05/04/12 0448  BP: 124/86  125/91 123/85  Pulse: 91  88 80  Temp:   98 F (36.7 C) 97.6 F (36.4 C)  TempSrc:   Oral Oral  Resp: 18  20 20   Height:      Weight:      SpO2: 96% 97% 95% 98%    General: Looks systemically well but clearly cushingoid. Cardiovascular: Heart sounds are present and normal without murmurs. Respiratory: Lung fields are clear. She is alert and orientated without any focal neurologic  signs.  Discharge Instructions  Discharge Orders    Future Appointments: Provider: Department: Dept Phone: Center:   05/13/2012 8:00 AM [facility] [phone] [facility]   05/20/2012 2:30 PM Theda Sers [facility] [phone] None   05/20/2012 3:00 PM Eston Esters, MD [facility] [phone] None   05/25/2012 9:00 AM Satira Sark, MD [facility] [phone] [id number]     Future Orders Please Complete By Expires   Diet - low sodium heart healthy      Increase activity slowly        Medication List  As of 05/04/2012  7:54 AM   TAKE these medications         acetaminophen 500 MG tablet   Commonly known as: TYLENOL   Take 1,500 mg by mouth as needed.      acyclovir 400 MG tablet   Commonly known as: ZOVIRAX   Take 400 mg by mouth every morning.      albuterol 108 (90 BASE) MCG/ACT inhaler   Commonly known as: PROVENTIL HFA;VENTOLIN HFA   Inhale into the lungs every 4 (four) hours as needed.      ALPRAZolam 0.5 MG tablet   Commonly known as: XANAX   Take 0.5-1 mg by mouth daily as needed. *May take one tablet by mouth during the day if/as needed      enoxaparin 150 MG/ML injection   Commonly known as: LOVENOX   Inject 0.94 mLs (140 mg total) into the skin daily.  fish oil-omega-3 fatty acids 1000 MG capsule   Take 1 g by mouth every morning.      fluconazole 100 MG tablet   Commonly known as: DIFLUCAN   Take 100 mg by mouth every morning.      HYDROcodone-acetaminophen 10-325 MG per tablet   Commonly known as: NORCO   Take 1 tablet by mouth Every 6 hours as needed.      insulin aspart 100 UNIT/ML injection   Commonly known as: novoLOG   Inject into the skin 3 (three) times daily before meals. Sliding scale      metoprolol succinate 25 MG 24 hr tablet   Commonly known as: TOPROL-XL   Take 25 mg by mouth every morning.      mycophenolate 250 MG capsule   Commonly known as: CELLCEPT   Take 500-1,000 mg by mouth 2 (two) times  daily. Takes 500 mg twice a day through 05/05/2012 then will take 750 mg twice a day for 1 week then 1000 mg twice a day for 1 week      predniSONE 20 MG tablet   Commonly known as: DELTASONE   Take 2 tablets (40 mg total) by mouth daily with breakfast.      sertraline 50 MG tablet   Commonly known as: ZOLOFT   Take 150 mg by mouth every morning.      sulfamethoxazole-trimethoprim 800-160 MG per tablet   Commonly known as: BACTRIM DS   Take 800 mg by mouth Twice daily. Take on m-w-f      triamterene-hydrochlorothiazide 37.5-25 MG per capsule   Commonly known as: DYAZIDE   Take 1 capsule by mouth every morning.      warfarin 5 MG tablet   Commonly known as: COUMADIN   Take 1 tablet (5 mg total) by mouth daily.              The results of significant diagnostics from this hospitalization (including imaging, microbiology, ancillary and laboratory) are listed below for reference.    Significant Diagnostic Studies: Dg Chest 2 View  05/02/2012  *RADIOLOGY REPORT*  Clinical Data: Chest pain, shortness of breath, hypertension, diabetes  CHEST - 2 VIEW  Comparison:  06/08/2011, 02/03/2011  Findings:  The heart size and mediastinal contours are within normal limits.  Both lungs are clear.  The visualized skeletal structures are unremarkable.  IMPRESSION: No active cardiopulmonary disease.  Original Report Authenticated By: Jerilynn Mages. Daryll Brod, M.D.   Ct Head Wo Contrast  05/02/2012  *RADIOLOGY REPORT*  Clinical Data:  Headaches, diabetes  CT HEAD WITHOUT CONTRAST  Technique:  Contiguous axial images were obtained from the base of the skull through the vertex without contrast  Comparison:  12/18/2011  Findings:  The brain has a normal appearance without evidence for hemorrhage, acute infarction, hydrocephalus, or mass lesion.  There is no extra axial fluid collection.  The skull and paranasal sinuses are normal.  IMPRESSION: Normal CT of the head without contrast.  Original Report Authenticated By:  Jerilynn Mages. Daryll Brod, M.D.   Ct Angio Chest W/cm &/or Wo Cm  05/02/2012  *RADIOLOGY REPORT*  Clinical Data: Chest pain for 3 days.  Elevated D-dimer.  CT ANGIOGRAPHY CHEST  Technique:  Multidetector CT imaging of the chest using the standard protocol during bolus administration of intravenous contrast. Multiplanar reconstructed images including MIPs were obtained and reviewed to evaluate the vascular anatomy.  Contrast: 119mL OMNIPAQUE IOHEXOL 350 MG/ML SOLN  Comparison: 02/03/2011  Findings: Technically adequate study with good opacification of  the central and segmental pulmonary arteries.  Multiple filling defects demonstrated and segmental lower lobe branches of the right lung consistent with acute pulmonary embolus.  No central emboli are visualized.  Normal heart size.  Normal caliber thoracic aorta.  No significant lymphadenopathy in the chest.  Esophagus is decompressed.  No pleural effusions.  Respiratory motion artifact limits visualization of the lung fields but no obvious consolidation or interstitial changes are identified.  No pneumothorax.  Normal alignment of the thoracic vertebrae.  IMPRESSION: Positive study for right lower lobe segmental pulmonary emboli.  Critical Value/emergent results were called by telephone at the time of interpretation on 05/02/2012 at 2231 hours to Dr. Wyvonnia Dusky, who verbally acknowledged these results.  Original Report Authenticated By: Neale Burly, M.D.   US Venous Img Lower Bilateral  05/03/2012  *RADIOLOGY REPORT*  Clinical Data: Question DVT.  Pain.  BILATERAL LOWER EXTREMITY VENOUS DUPLEX ULTRASOUND  Technique:  Gray-scale sonography with graded compression, as well as color Doppler and duplex ultrasound, were performed to evaluate the deep venous system of both lower extremities from the level of the common femoral vein through the popliteal and proximal calf veins.  Spectral Doppler was utilized to evaluate flow at rest and with distal augmentation maneuvers.   Comparison:  Bilateral venous lower extremity ultrasound 08/26/2011  Findings:  Normal compressibility of bilateral common femoral, superficial femoral, and popliteal veins is demonstrated, as well as the visualized proximal calf veins.  No filling defects to suggest DVT on grayscale or color Doppler imaging.  Doppler waveforms show normal direction of venous flow, normal respiratory phasicity and response to augmentation.  IMPRESSION: No evidence of deep vein thrombosis in either lower extremity.  Original Report Authenticated By: Curlene Dolphin, M.D.      Labs: Basic Metabolic Panel:  Lab [id number] 05/03/12 0527 05/02/12 2212  NA 141 141 138  K 4.0 2.8* 2.6*  CL 109 101 100  CO2 22 27 27   GLUCOSE 108* 97 95  BUN 12 11 14   CREATININE 1.27* 1.21* 1.23*  CALCIUM 9.3 9.4 9.6  MG -- 1.8 --  PHOS -- -- --   Liver Function Tests:  Lab 05/04/12 0511 05/02/12 2212  AST 36 37  ALT 48* 47*  ALKPHOS 39 40  BILITOT 0.4 0.6  PROT 5.9* 5.9*  ALBUMIN 3.3* 3.3*     CBC:  Lab 05/04/12 0511 05/03/12 0527 05/02/12 2212  WBC 9.9 5.9 6.8  NEUTROABS -- -- 3.8  HGB 11.9* 12.2 12.3  HCT 38.1 39.0 39.1  MCV 79.9 79.8 80.3  PLT 35* 12* 11*   Cardiac Enzymes:  Lab 05/02/12 2212  CKTOTAL 36  CKMB 1.2  CKMBINDEX --  TROPONINI <0.30   BNP: BNP (last 3 results)  Basename 06/05/11 1518 06/05/11 0639  PROBNP 261.8* 155.0*   CBG:  Lab 05/04/12 0732 05/03/12 2049 05/03/12 1622 05/03/12 1125 05/03/12 0723  GLUCAP 90 196* 133* 82 94    Time coordinating discharge: Greater than 30 minutes  Signed:  Deklynn Charlet C  [facility] 05/04/2012, 7:54 AM

## 2012-05-04 NOTE — Progress Notes (Unsigned)
Pt reports that she has been d/c from [facility]. Pt reports that she is taking 5mg  coumadin daily, 9mg  lovenox, and 40mg  prednisone. Pt also states that a [facility] nurse visits her home every day to check PT/INR.  This nurse request pt to come in to speak with our revenue dept and to bring proof of income so that she may rec. Assistance with medication.  Pt will call this desk 05/05/12 before coming in for assistance. Will notify MD

## 2012-05-05 LAB — LUPUS ANTICOAGULANT PANEL
DRVVT: 35.2 secs (ref <45.1)
Lupus Anticoagulant: NOT DETECTED
PTT Lupus Anticoagulant: 198.9 secs — ABNORMAL HIGH (ref 28.0–43.0)
PTTLA 4:1 Mix: 159.9 secs — ABNORMAL HIGH (ref 28.0–43.0)

## 2012-05-13 ENCOUNTER — Ambulatory Visit (HOSPITAL_COMMUNITY): Admission: RE | Admit: 2012-05-13 | Payer: BC Managed Care – PPO | Source: Ambulatory Visit

## 2012-05-17 IMAGING — CT CT HEAD W/O CM
1 series · 16 of 30 positions shown, 20 images · non-contrast
Comparison: None.

CLINICAL DATA: Headache.  Hyper tension.

CT HEAD WITHOUT CONTRAST
TECHNIQUE: Contiguous axial images were obtained from the base of
the skull through the vertex without contrast.

[Series 2: headseq 4.8 h37s · axial · 0.43mm/px · z∈[+1087,+1242]mm · 16 of 36 slices shown, 20 images]
[im 2/36  brain]
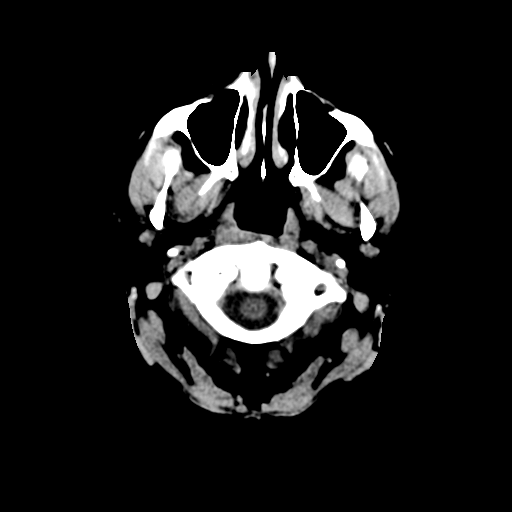
[im 2/36  bone]
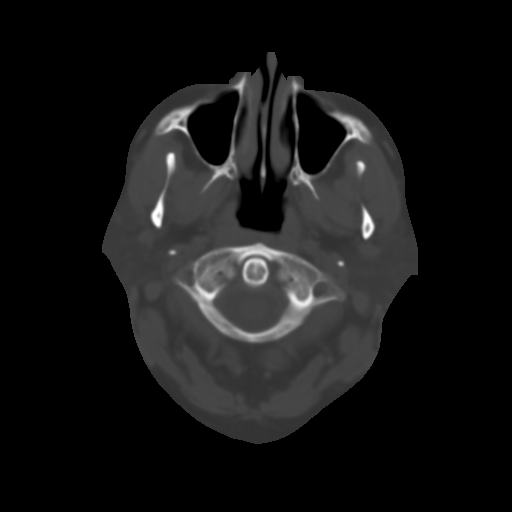
[im 4/36  brain]
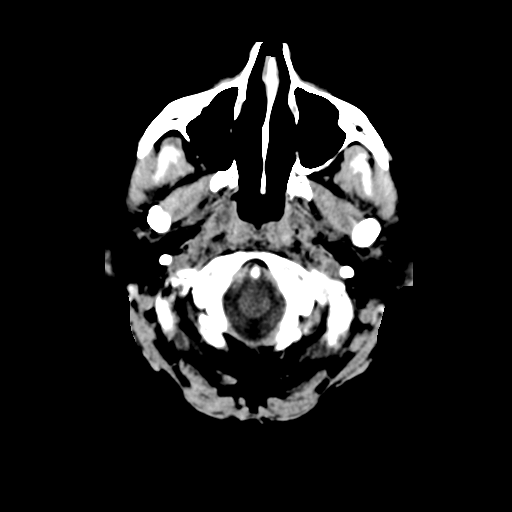
[im 7/36  brain]
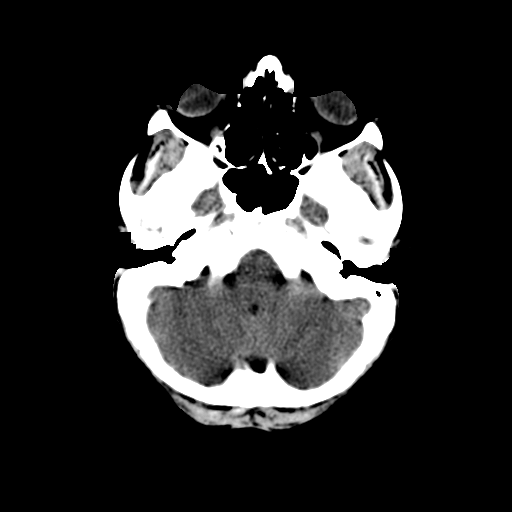
[im 9/36  brain]
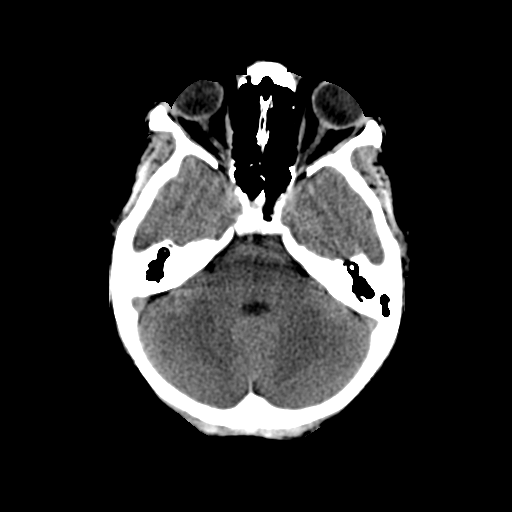
[im 10/36  brain]
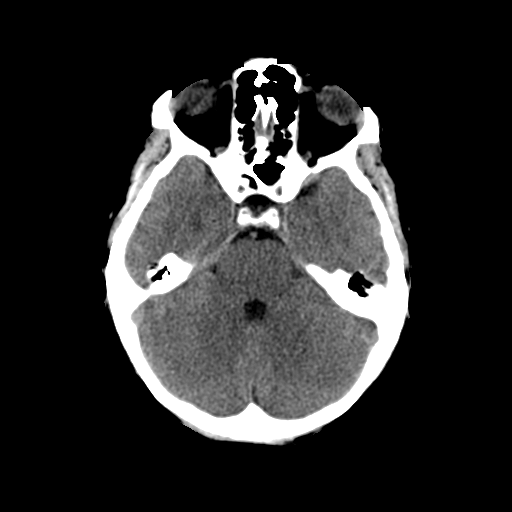
[im 10/36  bone]
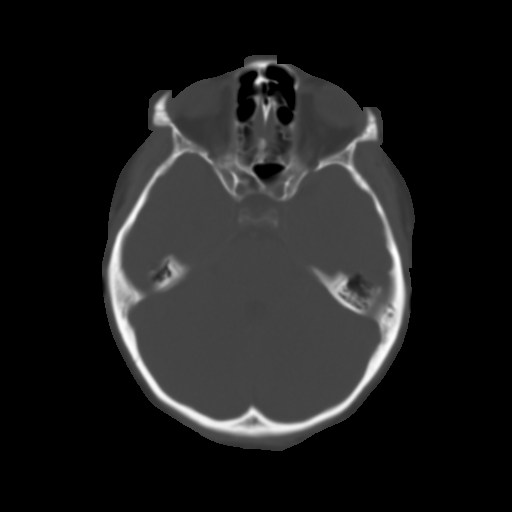
[im 13/36  brain]
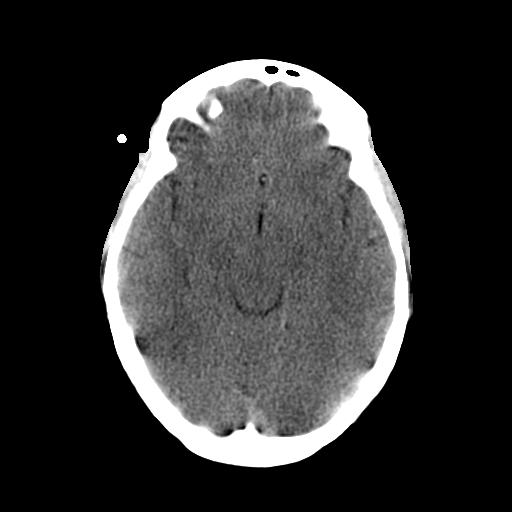
[im 15/36  brain]
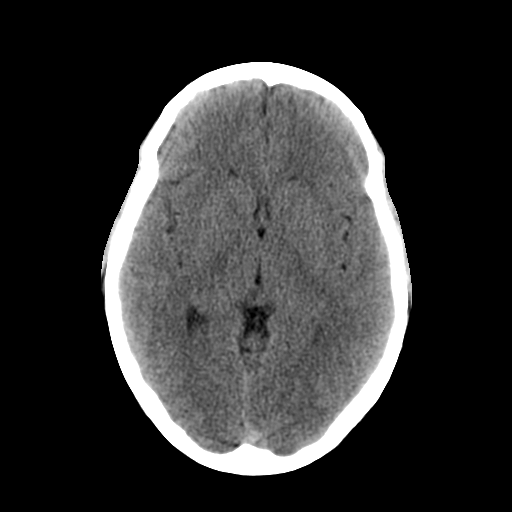
[im 17/36  brain]
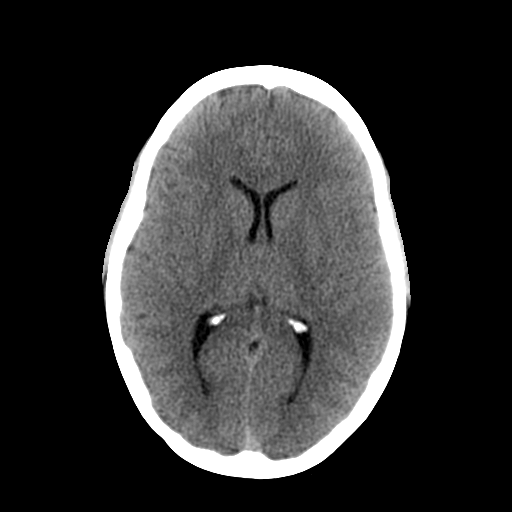
[im 19/36  brain]
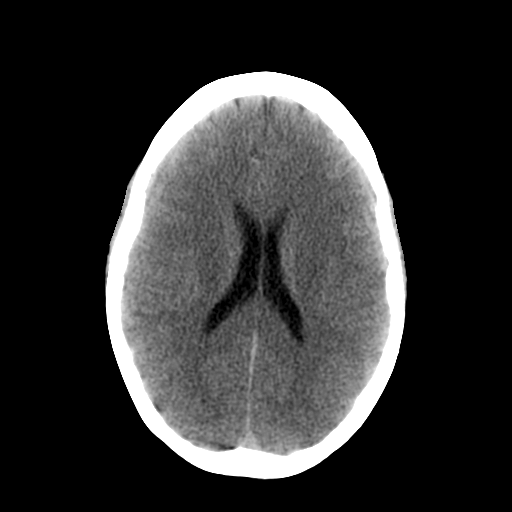
[im 19/36  bone]
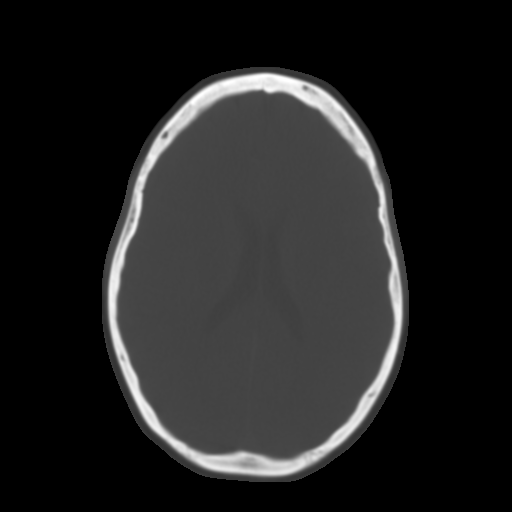
[im 21/36  brain]
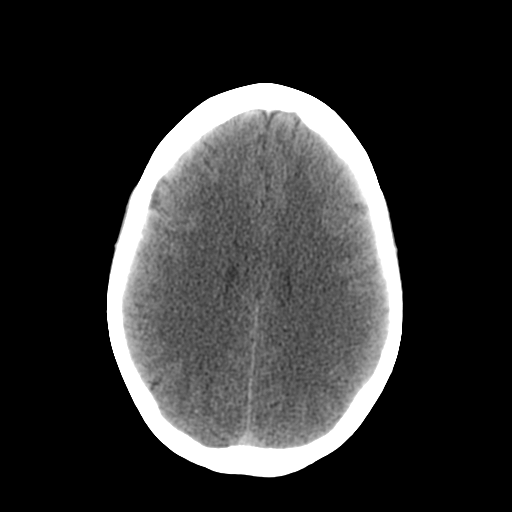
[im 23/36  brain]
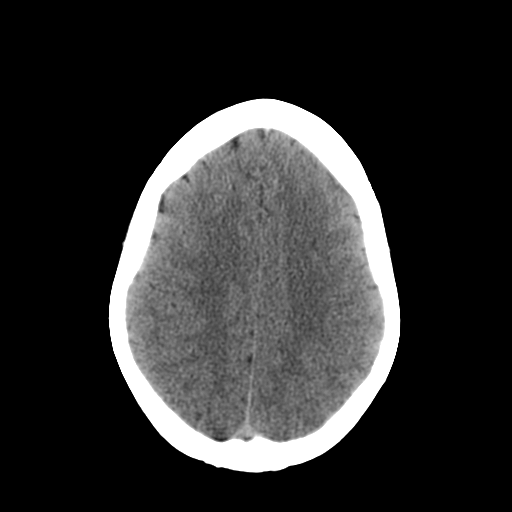
[im 26/36  brain]
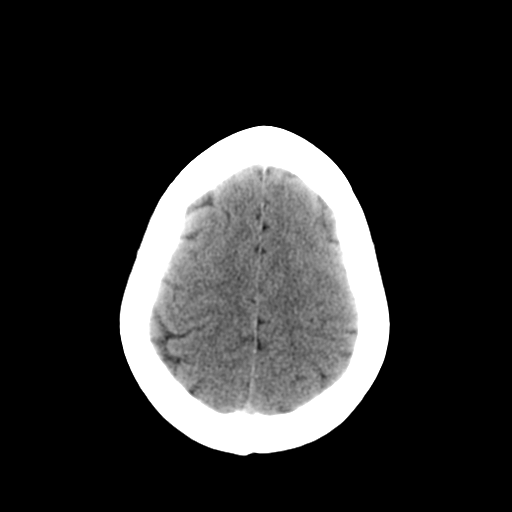
[im 27/36  brain]
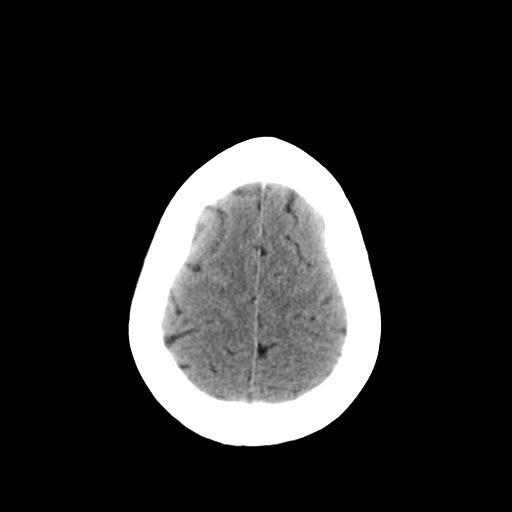
[im 27/36  bone]
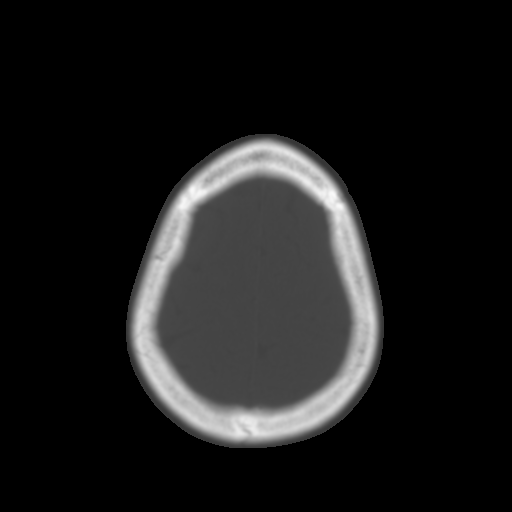
[im 29/36  brain]
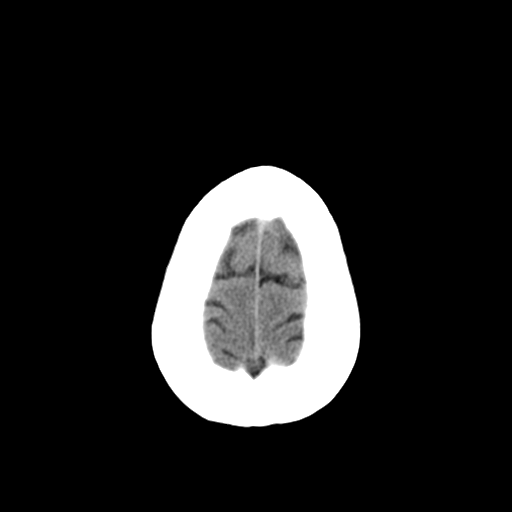
[im 32/36  brain]
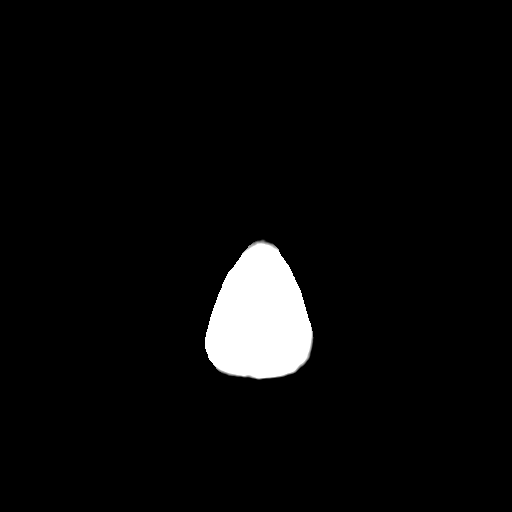
[im 34/36  brain]
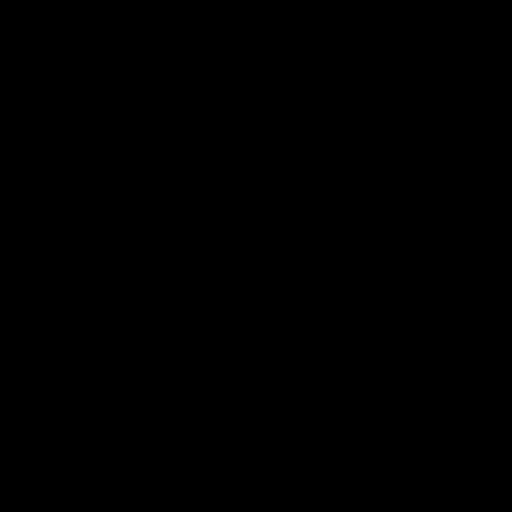

[16 of 30 positions shown; findings below may reference images not displayed]

FINDINGS: Brain parenchyma and ventricular system are unremarkable.
No mass effect, midline shift, or hemorrhage. Signal averaging in
the anterior right temporal lobe.
IMPRESSION: Negative.

## 2012-05-17 IMAGING — CR DG CHEST 2V
2 series · 2 of 2 positions shown · non-contrast
Comparison: 09/28/2006

CLINICAL DATA: Chest pain/short of breath

CHEST - 2 VIEW

[view not recorded (1 of 2)]
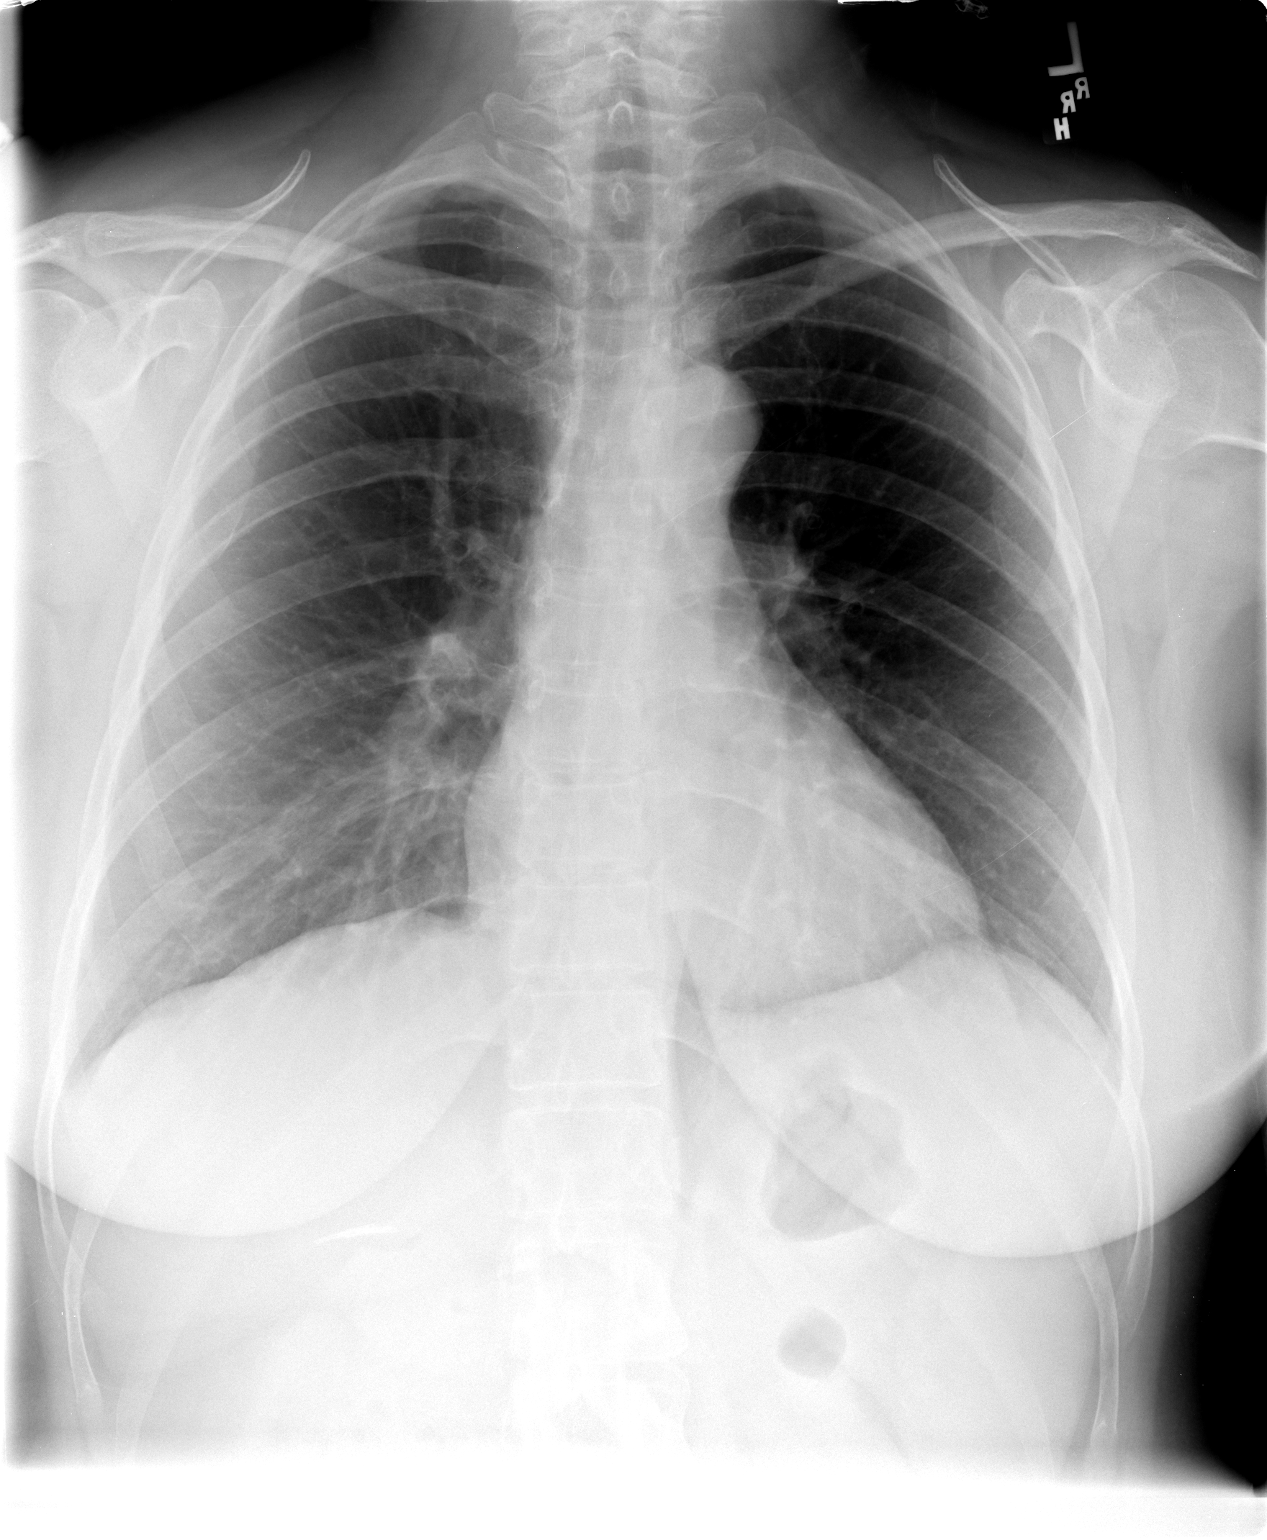

[view not recorded (2 of 2)]
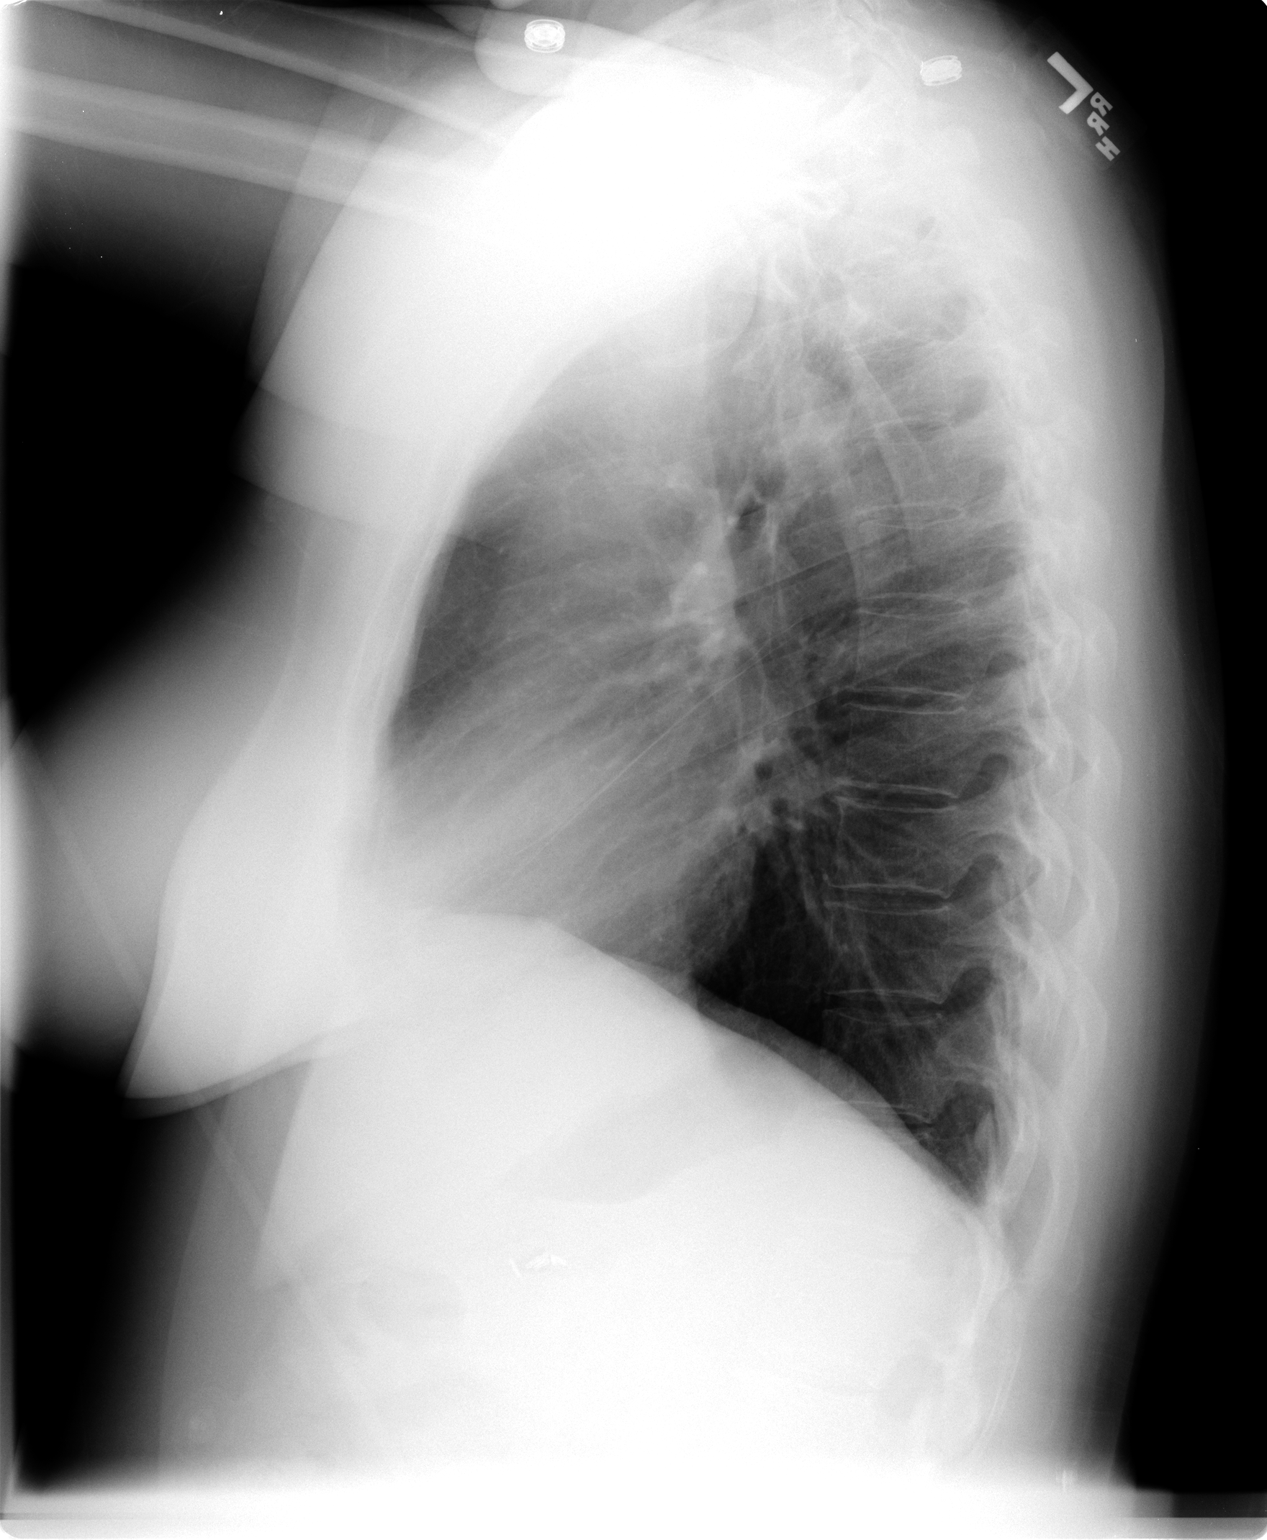

[2 of 2 positions shown; findings below may reference images not displayed]

FINDINGS: Heart and mediastinal contours normal.  Lungs clear.  No
pleural fluid.  Osseous structures and soft tissues unremarkable.
The lung bases are mildly hyperaerated and there is mild central
airway thickening.

Prior cholecystectomy.
IMPRESSION: Chronic changes as above.  No active disease.

## 2012-05-19 ENCOUNTER — Telehealth: Payer: Self-pay | Admitting: *Deleted

## 2012-05-19 NOTE — Telephone Encounter (Signed)
moved patient to 05-23-2012 starting at 10:45am with labs patient confirmed over the phone the new date and  time

## 2012-05-20 ENCOUNTER — Ambulatory Visit: Payer: BC Managed Care – PPO | Admitting: Oncology

## 2012-05-20 ENCOUNTER — Other Ambulatory Visit: Payer: BC Managed Care – PPO | Admitting: Lab

## 2012-05-23 ENCOUNTER — Other Ambulatory Visit: Payer: BC Managed Care – PPO | Admitting: Lab

## 2012-05-23 ENCOUNTER — Ambulatory Visit: Payer: BC Managed Care – PPO | Admitting: Family

## 2012-05-23 ENCOUNTER — Encounter: Payer: Self-pay | Admitting: Cardiology

## 2012-05-23 NOTE — Progress Notes (Signed)
No show, please reschedule.

## 2012-05-25 ENCOUNTER — Ambulatory Visit: Payer: BC Managed Care – PPO | Admitting: Cardiology

## 2012-05-25 ENCOUNTER — Encounter: Payer: Self-pay | Admitting: *Deleted

## 2012-05-26 IMAGING — CT CT HEAD W/O CM
1 series · 16 of 30 positions shown, 20 images · non-contrast
Comparison: 12/10/2010

CLINICAL DATA: Fever.  Headache.

CT HEAD WITHOUT CONTRAST
TECHNIQUE: Contiguous axial images were obtained from the base of
the skull through the vertex without contrast.

[Series 2: headseq 4.8 h37s · axial · 0.43mm/px · z∈[+85,+217]mm · 16 of 30 slices shown, 20 images]
[im 2/30  brain]
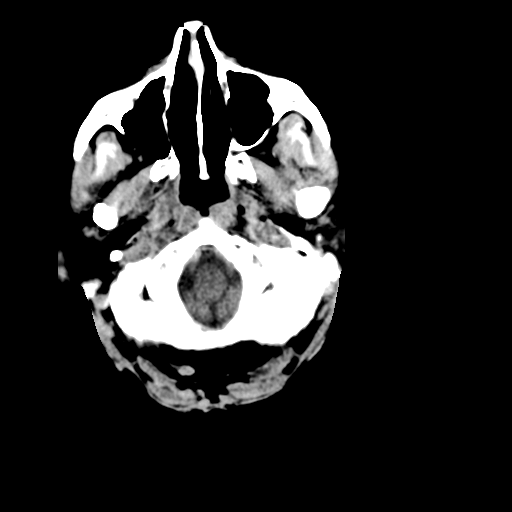
[im 2/30  bone]
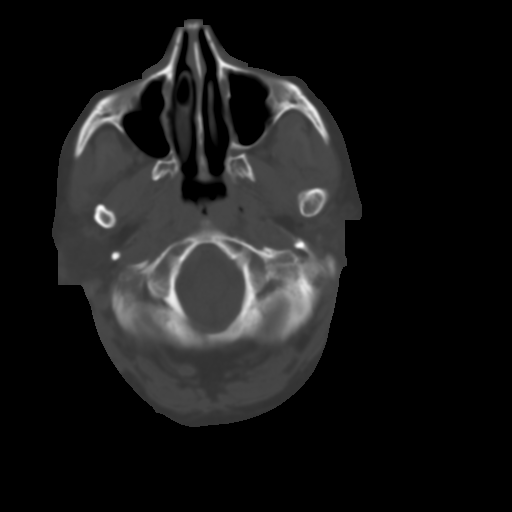
[im 4/30  brain]
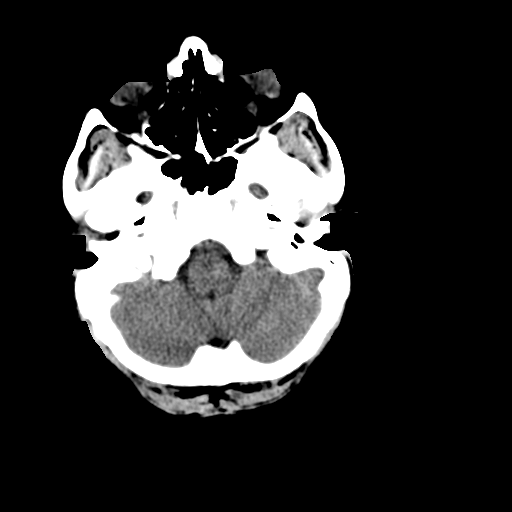
[im 6/30  brain]
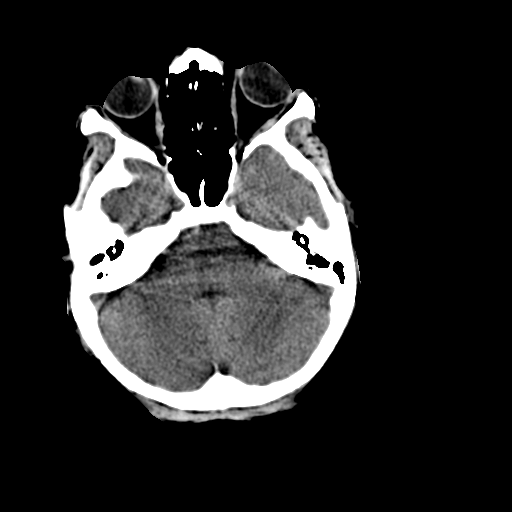
[im 8/30  brain]
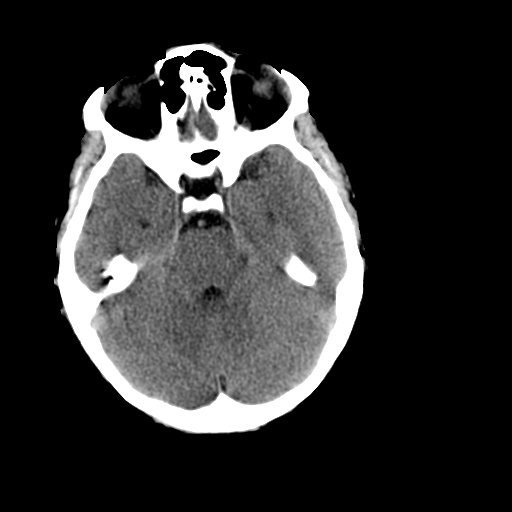
[im 9/30  brain]
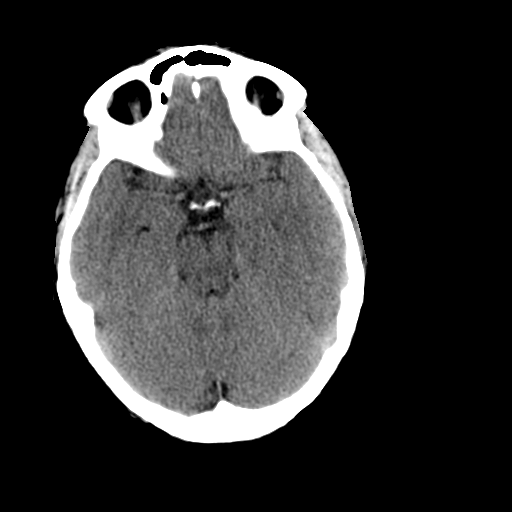
[im 9/30  bone]
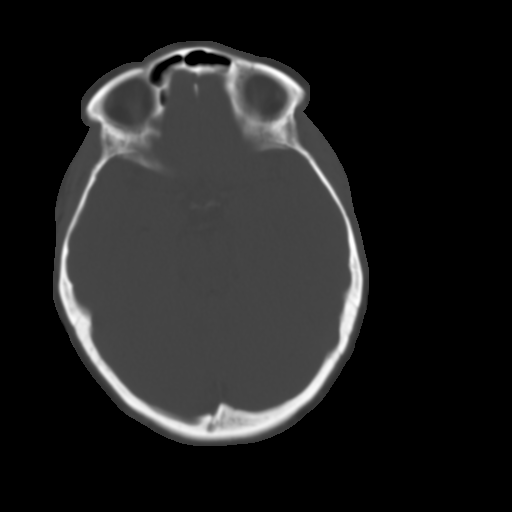
[im 11/30  brain]
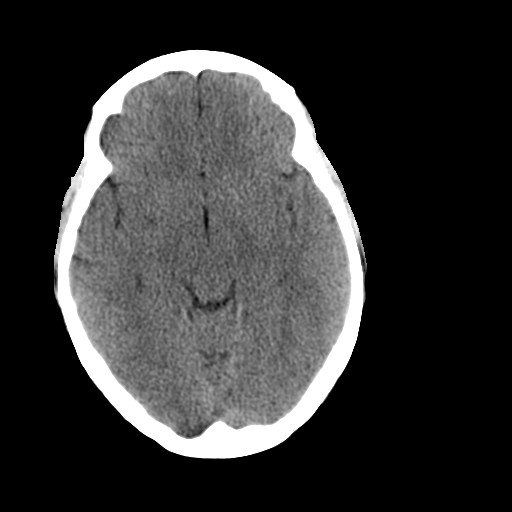
[im 13/30  brain]
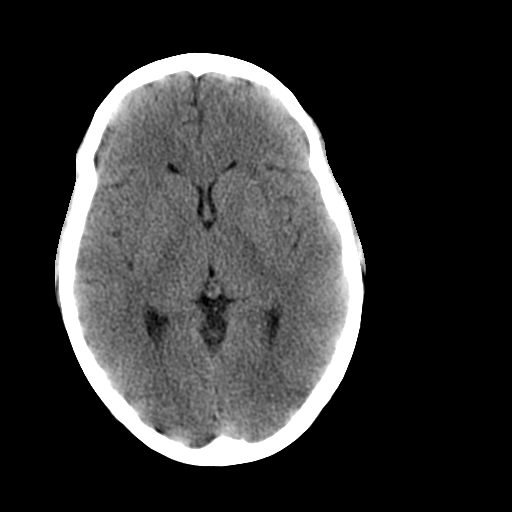
[im 15/30  brain]
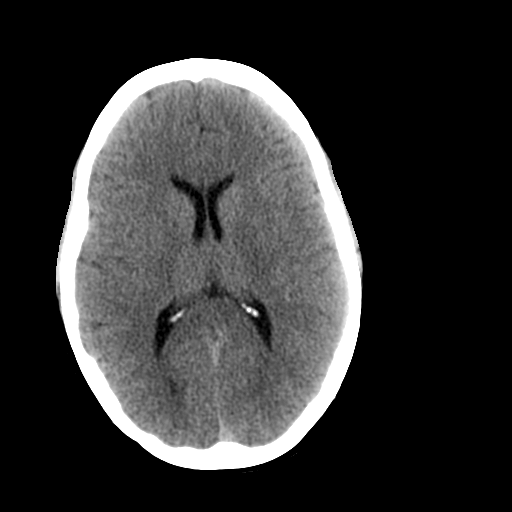
[im 16/30  brain]
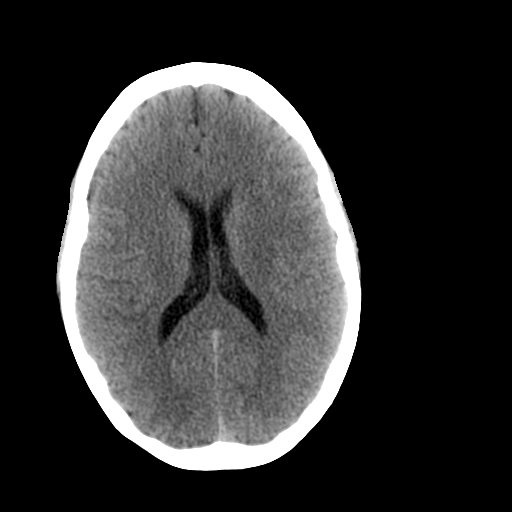
[im 16/30  bone]
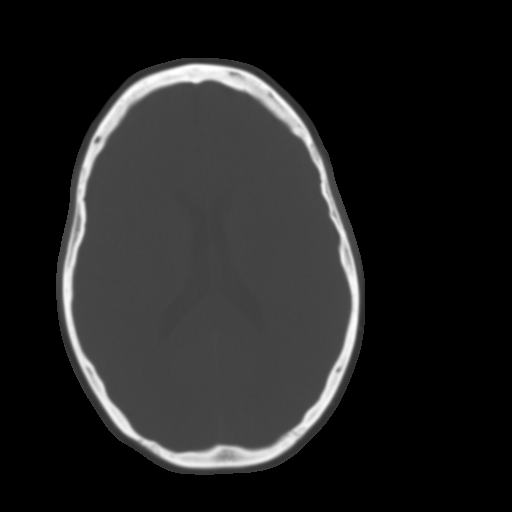
[im 18/30  brain]
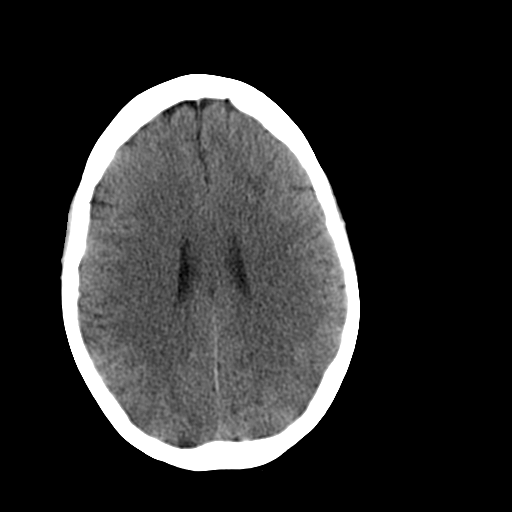
[im 20/30  brain]
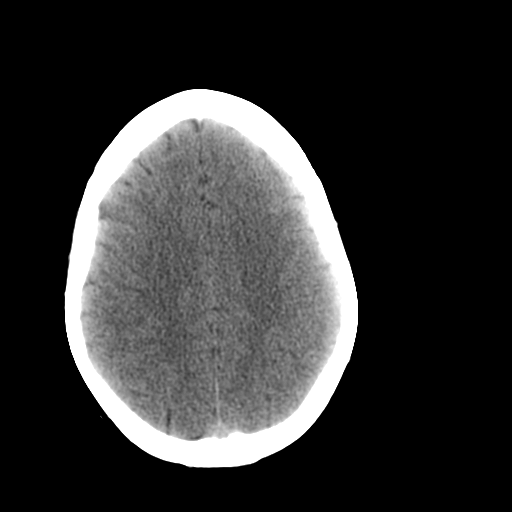
[im 22/30  brain]
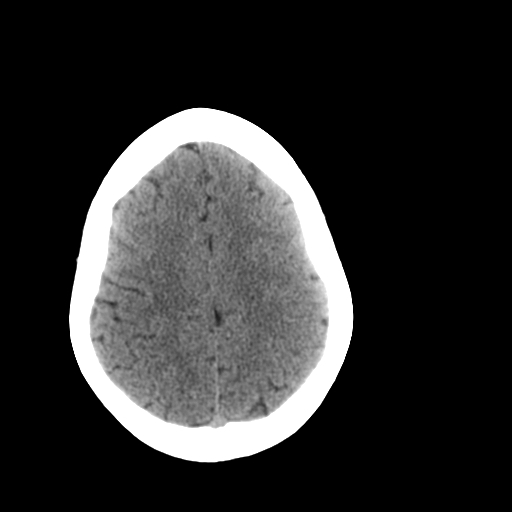
[im 23/30  brain]
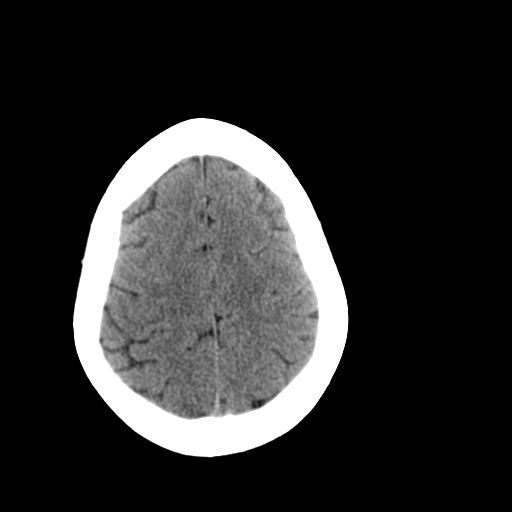
[im 23/30  bone]
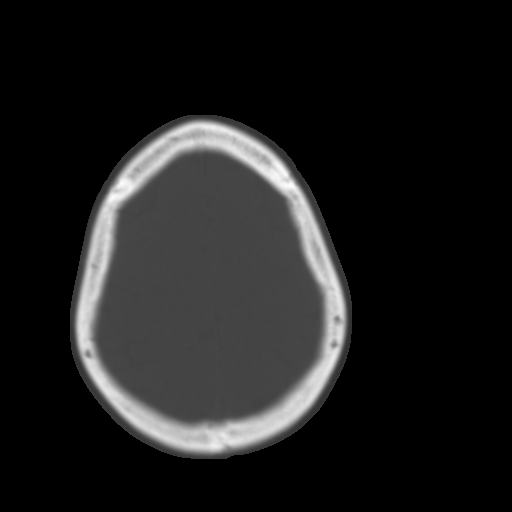
[im 25/30  brain]
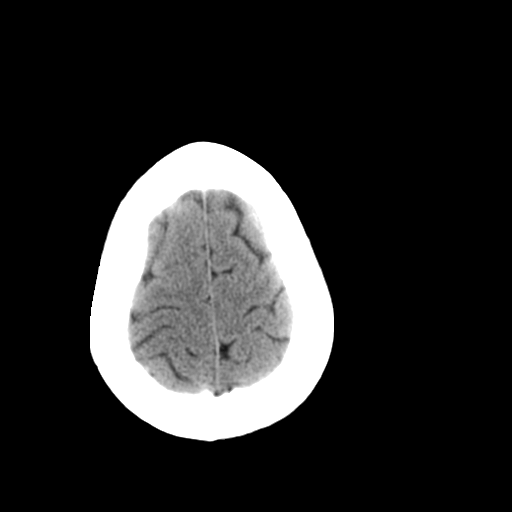
[im 27/30  brain]
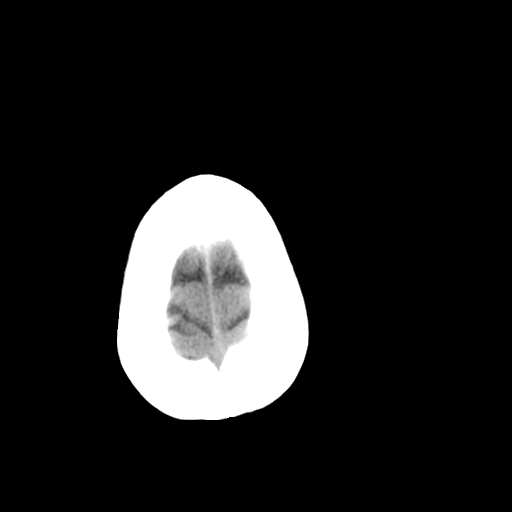
[im 29/30  brain]
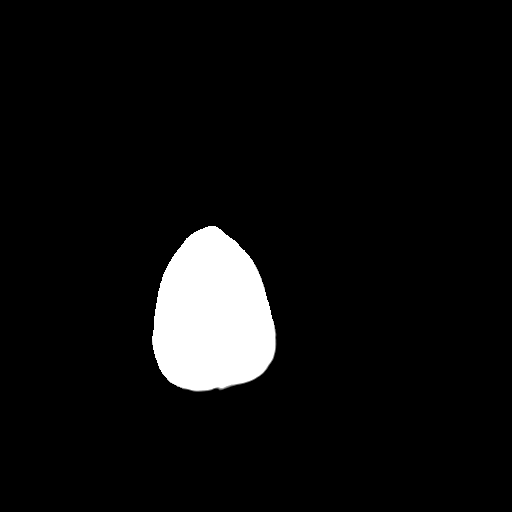

[16 of 30 positions shown; findings below may reference images not displayed]

FINDINGS: No mass effect, midline shift, or acute intracranial
hemorrhage.  Mastoid air cells are clear.  Extraaxial space and
brain rectum are within normal limits.
IMPRESSION: Negative head CT.

## 2012-06-02 ENCOUNTER — Other Ambulatory Visit (HOSPITAL_COMMUNITY): Payer: Self-pay | Admitting: Internal Medicine

## 2012-06-02 DIAGNOSIS — Z0000 Encounter for general adult medical examination without abnormal findings: Secondary | ICD-10-CM

## 2012-06-06 ENCOUNTER — Ambulatory Visit (HOSPITAL_COMMUNITY)
Admission: RE | Admit: 2012-06-06 | Discharge: 2012-06-06 | Disposition: A | Discharge status: Home / Self Care | Payer: BC Managed Care – PPO | Source: Ambulatory Visit | Attending: Internal Medicine | Admitting: Internal Medicine

## 2012-06-06 DIAGNOSIS — Z0000 Encounter for general adult medical examination without abnormal findings: Secondary | ICD-10-CM

## 2012-06-06 DIAGNOSIS — Z1231 Encounter for screening mammogram for malignant neoplasm of breast: Secondary | ICD-10-CM | POA: Insufficient documentation

## 2012-06-06 DIAGNOSIS — Z13820 Encounter for screening for osteoporosis: Secondary | ICD-10-CM | POA: Insufficient documentation

## 2012-06-06 DIAGNOSIS — Z780 Asymptomatic menopausal state: Secondary | ICD-10-CM | POA: Insufficient documentation

## 2012-06-21 ENCOUNTER — Emergency Department (HOSPITAL_COMMUNITY): Payer: BC Managed Care – PPO

## 2012-06-21 ENCOUNTER — Encounter (HOSPITAL_COMMUNITY): Payer: Self-pay

## 2012-06-21 ENCOUNTER — Emergency Department (HOSPITAL_COMMUNITY)
Admission: EM | Admit: 2012-06-21 | Discharge: 2012-06-21 | Disposition: A | Discharge status: Home / Self Care | Payer: BC Managed Care – PPO | Attending: Emergency Medicine | Admitting: Emergency Medicine

## 2012-06-21 DIAGNOSIS — E119 Type 2 diabetes mellitus without complications: Secondary | ICD-10-CM | POA: Insufficient documentation

## 2012-06-21 DIAGNOSIS — Z801 Family history of malignant neoplasm of trachea, bronchus and lung: Secondary | ICD-10-CM | POA: Insufficient documentation

## 2012-06-21 DIAGNOSIS — Z7901 Long term (current) use of anticoagulants: Secondary | ICD-10-CM | POA: Insufficient documentation

## 2012-06-21 DIAGNOSIS — Z8049 Family history of malignant neoplasm of other genital organs: Secondary | ICD-10-CM | POA: Insufficient documentation

## 2012-06-21 DIAGNOSIS — I10 Essential (primary) hypertension: Secondary | ICD-10-CM | POA: Insufficient documentation

## 2012-06-21 DIAGNOSIS — X500XXA Overexertion from strenuous movement or load, initial encounter: Secondary | ICD-10-CM | POA: Insufficient documentation

## 2012-06-21 DIAGNOSIS — F329 Major depressive disorder, single episode, unspecified: Secondary | ICD-10-CM | POA: Insufficient documentation

## 2012-06-21 DIAGNOSIS — S93402A Sprain of unspecified ligament of left ankle, initial encounter: Secondary | ICD-10-CM

## 2012-06-21 DIAGNOSIS — S93409A Sprain of unspecified ligament of unspecified ankle, initial encounter: Secondary | ICD-10-CM | POA: Insufficient documentation

## 2012-06-21 DIAGNOSIS — Z87891 Personal history of nicotine dependence: Secondary | ICD-10-CM | POA: Insufficient documentation

## 2012-06-21 DIAGNOSIS — F3289 Other specified depressive episodes: Secondary | ICD-10-CM | POA: Insufficient documentation

## 2012-06-21 DIAGNOSIS — Z91038 Other insect allergy status: Secondary | ICD-10-CM | POA: Insufficient documentation

## 2012-06-21 DIAGNOSIS — Z809 Family history of malignant neoplasm, unspecified: Secondary | ICD-10-CM | POA: Insufficient documentation

## 2012-06-21 DIAGNOSIS — Z8489 Family history of other specified conditions: Secondary | ICD-10-CM | POA: Insufficient documentation

## 2012-06-21 HISTORY — DX: Other pulmonary embolism without acute cor pulmonale: I26.99

## 2012-06-21 MED ORDER — ACETAMINOPHEN 500 MG PO TABS
1000.0000 mg | ORAL_TABLET | Freq: Once | ORAL | Status: AC
  Administered 2012-06-21: 1000 mg via ORAL
  Filled 2012-06-21: qty 2

## 2012-06-21 NOTE — ED Provider Notes (Signed)
History   Scribed for [name], MD, the patient was seen in room APA18/APA18 . This chart was scribed by [name].   CSN: [id number]  Arrival date & time 06/21/12  1625   First MD Initiated Contact with Patient 06/21/12 1644      Chief Complaint  Patient presents with  . Ankle Pain    (Consider location/radiation/quality/duration/timing/severity/associated sxs/prior Treatment)  Hx was provided by the pt. HPI Summer Cook is a 46 y.o. female who presents to the Emergency Department complaining of a left ankle inversion injury 1 hour prior to arrival after stepping off pavement. Pt denies hitting head, LOC, and any other injury. Pt reports mild intermittent pain when weight bearing and improved pain at rest. Pt reports mild swelling. Pt reports hx of ITP.  Past Medical History  Diagnosis Date  . Essential hypertension, benign   . ITP (idiopathic thrombocytopenic purpura)   . Thrombocytopenia     Received Cytoxan PO. Last dose 12/11/11  . Depression   . Thrush, oral 05/29/2011  . Evan's syndrome   . Type 2 diabetes mellitus   . Pulmonary embolism     Past Surgical History  Procedure Date  . Cholecystectomy   . Bone marrow biopsy   . Splenectomy, total     Family History  Problem Relation Age of Onset  . Cancer Mother   . Cervical cancer Mother   . Cancer Father   . Lung cancer Father   . Pneumonia Brother     History  Substance Use Topics  . Smoking status: Former Smoker    Types: Cigarettes    Quit date: 02/15/2011  . Smokeless tobacco: Never Used  . Alcohol Use: No    OB History    Grav Para Term Preterm Abortions TAB SAB Ect Mult Living                  Review of Systems  Constitutional: Negative for fatigue.  HENT: Negative for congestion, sinus pressure and ear discharge.   Eyes: Negative for discharge.  Respiratory: Negative for cough.   Cardiovascular: Negative for chest pain.  Gastrointestinal: Negative for abdominal pain and  diarrhea.  Genitourinary: Negative for frequency and hematuria.  Musculoskeletal: Negative for back pain.       Ankle pain   Skin: Negative for rash.  Neurological: Negative for seizures and headaches.  Hematological: Negative.   Psychiatric/Behavioral: Negative for hallucinations.  All other systems reviewed and are negative.    Allergies  Yellow jacket venom  Home Medications   Current Outpatient Rx  Name Route Sig Dispense Refill  . ACYCLOVIR 400 MG PO TABS Oral Take 400 mg by mouth every morning.     . ALBUTEROL SULFATE HFA 108 (90 BASE) MCG/ACT IN AERS Inhalation Inhale 2 puffs into the lungs every 4 (four) hours as needed. For shortness of breath    . ALPRAZOLAM 0.5 MG PO TABS Oral Take 0.5-1 mg by mouth daily as needed. *May take one tablet by mouth during the day if/as needed    . ALKA-SELTZER PO Oral Take 1 tablet by mouth daily as needed. *Chew one tablet daily as needed for heartburn relief*    . FLUCONAZOLE 100 MG PO TABS Oral Take 100 mg by mouth daily.    . INSULIN ASPART 100 UNIT/ML [place] SOLN Subcutaneous Inject into the skin 3 (three) times daily before meals. Sliding scale    . METOPROLOL SUCCINATE ER 25 MG PO TB24 Oral Take 25 mg by  mouth every morning.     Marland Kitchen MYCOPHENOLATE MOFETIL 250 MG PO CAPS Oral Take 250-500 mg by mouth 2 (two) times daily. Take two capsules in the morning and one capsule in the evening    . PREDNISONE 5 MG PO TABS Oral Take 20 mg by mouth every morning.    Marland Kitchen SERTRALINE HCL 50 MG PO TABS Oral Take 150 mg by mouth every morning.    . WARFARIN SODIUM 1 MG PO TABS Oral Take 4 mg by mouth every evening.      BP 126/89  Pulse 89  Temp 98.4 F (36.9 C) (Oral)  Resp 20  Ht 5\' 5"  (1.651 m)  Wt 206 lb (93.441 kg)  BMI 34.28 kg/m2  SpO2 99%  Physical Exam  Nursing note and vitals reviewed. Constitutional: She is oriented to person, place, and time. She appears well-developed and well-nourished.  HENT:  Head: Normocephalic and atraumatic.    Eyes: Conjunctivae normal and EOM are normal. Pupils are equal, round, and reactive to light.  Neck: Normal range of motion. Neck supple.  Cardiovascular: Normal rate, regular rhythm and normal heart sounds.   Pulmonary/Chest: Effort normal and breath sounds normal.  Abdominal: Soft. Bowel sounds are normal.  Musculoskeletal: She exhibits tenderness.       mild tenderness on inferior lateral malleolus on exam  Neurological: She is alert and oriented to person, place, and time.  Skin: Skin is warm and dry.  Psychiatric: She has a normal mood and affect.    ED Course  Procedures (including critical care time)  Labs Reviewed - No data to display Dg Ankle Complete Left  06/21/2012  *RADIOLOGY REPORT*  Clinical Data: Injury  LEFT ANKLE COMPLETE - 3+ VIEW  Comparison: None.  Findings: Soft tissue swelling over the lateral malleolus is prominent.  No acute fracture and no dislocation.  IMPRESSION: No acute bony pathology.  Soft tissue swelling over the lateral malleolus is noted.   Original Report Authenticated By: Jamas Lav, M.D.      No diagnosis found.    MDM  X-ray negative. Minimal edema. Ankle brace, ice, elevate, immobilize      I personally performed the services described in this documentation, which was scribed in my presence. The recorded information has been reviewed and considered.    [name], MD 06/21/12 [name]

## 2012-06-21 NOTE — ED Notes (Signed)
Pt requesting to have INR and CBC drawn for testing given hx.  edp notified.

## 2012-06-21 NOTE — ED Notes (Signed)
Pt reports approx 1 hour ago stepped off pavement and twisted left ankle.  Ankle swollen.  Reports history of ITP.

## 2012-06-27 ENCOUNTER — Emergency Department (HOSPITAL_COMMUNITY)
Admission: EM | Admit: 2012-06-27 | Discharge: 2012-06-27 | Disposition: A | Discharge status: Home / Self Care | Payer: BC Managed Care – PPO | Attending: Emergency Medicine | Admitting: Emergency Medicine

## 2012-06-27 ENCOUNTER — Encounter (HOSPITAL_COMMUNITY): Payer: Self-pay | Admitting: Emergency Medicine

## 2012-06-27 DIAGNOSIS — H00019 Hordeolum externum unspecified eye, unspecified eyelid: Secondary | ICD-10-CM | POA: Insufficient documentation

## 2012-06-27 DIAGNOSIS — E119 Type 2 diabetes mellitus without complications: Secondary | ICD-10-CM | POA: Insufficient documentation

## 2012-06-27 DIAGNOSIS — Z79899 Other long term (current) drug therapy: Secondary | ICD-10-CM | POA: Insufficient documentation

## 2012-06-27 DIAGNOSIS — Z794 Long term (current) use of insulin: Secondary | ICD-10-CM | POA: Insufficient documentation

## 2012-06-27 DIAGNOSIS — Z86711 Personal history of pulmonary embolism: Secondary | ICD-10-CM | POA: Insufficient documentation

## 2012-06-27 DIAGNOSIS — H00015 Hordeolum externum left lower eyelid: Secondary | ICD-10-CM

## 2012-06-27 DIAGNOSIS — I10 Essential (primary) hypertension: Secondary | ICD-10-CM | POA: Insufficient documentation

## 2012-06-27 DIAGNOSIS — L600 Ingrowing nail: Secondary | ICD-10-CM

## 2012-06-27 DIAGNOSIS — Z87891 Personal history of nicotine dependence: Secondary | ICD-10-CM | POA: Insufficient documentation

## 2012-06-27 MED ORDER — OXYCODONE-ACETAMINOPHEN 5-325 MG PO TABS
1.0000 | ORAL_TABLET | Freq: Once | ORAL | Status: AC
  Administered 2012-06-27: 1 via ORAL
  Filled 2012-06-27: qty 1

## 2012-06-27 MED ORDER — ERYTHROMYCIN 5 MG/GM OP OINT
TOPICAL_OINTMENT | Freq: Once | OPHTHALMIC | Status: AC
  Administered 2012-06-27: 1 via OPHTHALMIC
  Filled 2012-06-27: qty 3.5

## 2012-06-27 MED ORDER — CEPHALEXIN 500 MG PO CAPS
500.0000 mg | ORAL_CAPSULE | Freq: Four times a day (QID) | ORAL | Status: DC
Start: 2012-06-27 — End: 2012-07-06

## 2012-06-27 MED ORDER — CEPHALEXIN 500 MG PO CAPS
500.0000 mg | ORAL_CAPSULE | Freq: Once | ORAL | Status: AC
  Administered 2012-06-27: 500 mg via ORAL
  Filled 2012-06-27: qty 1

## 2012-06-27 MED ORDER — OXYCODONE-ACETAMINOPHEN 5-325 MG PO TABS: 1.0000 | ORAL_TABLET | ORAL | Status: DC | PRN

## 2012-06-27 NOTE — ED Notes (Signed)
Pt c/o left eye irritation on Friday and left great toe pain x 2 weeks.

## 2012-06-27 NOTE — ED Provider Notes (Signed)
History     CSN: [id number]  Arrival date & time 06/27/12  [id number]   First MD Initiated Contact with Patient 06/27/12 [phone]      Chief Complaint  Patient presents with  . Toe Pain  . Eye Pain    (Consider location/radiation/quality/duration/timing/severity/associated sxs/prior treatment) HPI Comments: Patient complains of pain, swelling, and drainage to the skin surrounding the right great toenail.  She states she was concerned that she had an ingrown toenail, she tried applying Neosporin and cleaning it with hydrogen peroxide, but she states the symptoms became worse. She now reports bloody drainage from the area surrounding the nail.  She also tried trimming her nail without relief.   She also complains of a red, swollen," bump" to the left lower eyelid that has been present for 3 days. Pain to the eye is worse with blinking and movement of her eye. She denies visual changes.  She also denies fever, chills, or red streaks from her toe.  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  She does states that she has regular INR checks.  She denies any chest pain or shortness of breath  Patient is a 46 y.o. female presenting with toe pain and eye pain. The history is provided by the patient.  Toe Pain This is a chronic problem. The current episode started 1 to 4 weeks ago. The problem occurs constantly. The problem has been unchanged. Associated symptoms include arthralgias. Pertinent negatives include no chills, fever, headaches, joint swelling, nausea, neck pain, numbness, rash, sore throat, swollen glands, urinary symptoms, visual change, vomiting or weakness. The symptoms are aggravated by standing and walking (palpation). Treatments tried: Neosporin, hydrogen peroxide. The treatment provided no relief.  Eye Pain This is a new problem. The current episode started in the past 7 days. The problem occurs constantly. The problem has been  unchanged. Associated symptoms include arthralgias. Pertinent negatives include no chills, fever, headaches, joint swelling, nausea, neck pain, numbness, rash, sore throat, swollen glands, urinary symptoms, visual change, vomiting or weakness. Exacerbated by: blinking and palpation. She has tried nothing for the symptoms. The treatment provided no relief.    Past Medical History  Diagnosis Date  . Essential hypertension, benign   . ITP (idiopathic thrombocytopenic purpura)   . Thrombocytopenia     Received Cytoxan PO. Last dose 12/11/11  . Depression   . Thrush, oral 05/29/2011  . Evan's syndrome   . Type 2 diabetes mellitus   . Pulmonary embolism     Past Surgical History  Procedure Date  . Cholecystectomy   . Bone marrow biopsy   . Splenectomy, total     Family History  Problem Relation Age of Onset  . Cancer Mother   . Cervical cancer Mother   . Cancer Father   . Lung cancer Father   . Pneumonia Brother     History  Substance Use Topics  . Smoking status: Former Smoker    Types: Cigarettes    Quit date: 02/15/2011  . Smokeless tobacco: Never Used  . Alcohol Use: No    OB History    Grav Para Term Preterm Abortions TAB SAB Ect Mult Living                  Review of Systems  Constitutional: Negative for fever and chills.  HENT: Negative for sore throat and neck pain.   Eyes: Positive for pain.  Gastrointestinal: Negative for  nausea and vomiting.  Musculoskeletal: Positive for arthralgias. Negative for joint swelling.  Skin: Negative for rash.  Neurological: Negative for weakness, numbness and headaches.    Allergies  Yellow jacket venom  Home Medications   Current Outpatient Rx  Name Route Sig Dispense Refill  . ACYCLOVIR 400 MG PO TABS Oral Take 400 mg by mouth every morning.     . ALBUTEROL SULFATE HFA 108 (90 BASE) MCG/ACT IN AERS Inhalation Inhale 2 puffs into the lungs every 4 (four) hours as needed. For shortness of breath    . ALPRAZOLAM 0.5 MG  PO TABS Oral Take 0.5-1 mg by mouth daily as needed. *May take one tablet by mouth during the day if/as needed    . ALKA-SELTZER PO Oral Take 1 tablet by mouth daily as needed. *Chew one tablet daily as needed for heartburn relief*    . CEPHALEXIN 500 MG PO CAPS Oral Take 1 capsule (500 mg total) by mouth 4 (four) times daily. For 10 days 40 capsule 0  . FLUCONAZOLE 100 MG PO TABS Oral Take 100 mg by mouth daily.    . INSULIN ASPART 100 UNIT/ML [place] SOLN Subcutaneous Inject into the skin 3 (three) times daily before meals. Sliding scale    . METOPROLOL SUCCINATE ER 25 MG PO TB24 Oral Take 25 mg by mouth every morning.     Marland Kitchen MYCOPHENOLATE MOFETIL 250 MG PO CAPS Oral Take 250-500 mg by mouth 2 (two) times daily. Take two capsules in the morning and one capsule in the evening    . OXYCODONE-ACETAMINOPHEN 5-325 MG PO TABS Oral Take 1 tablet by mouth every 4 (four) hours as needed for pain. 15 tablet 0  . PREDNISONE 5 MG PO TABS Oral Take 20 mg by mouth every morning.    Marland Kitchen SERTRALINE HCL 50 MG PO TABS Oral Take 150 mg by mouth every morning.    . WARFARIN SODIUM 1 MG PO TABS Oral Take 4 mg by mouth every evening.      BP 140/107  Pulse 84  Temp 98.2 F (36.8 C) (Oral)  Resp 18  Ht 5\' 5"  (1.651 m)  Wt 206 lb (93.441 kg)  BMI 34.28 kg/m2  SpO2 97%  Physical Exam  Nursing note and vitals reviewed. Constitutional: She is oriented to person, place, and time. She appears well-developed and well-nourished. No distress.  HENT:  Head: Normocephalic and atraumatic.  Eyes: Conjunctivae normal and EOM are normal. Pupils are equal, round, and reactive to light. No foreign bodies found. Right eye exhibits no chemosis and no discharge. Left eye exhibits hordeolum. Left eye exhibits no chemosis, no discharge and no exudate. Left conjunctiva is not injected. Left conjunctiva has no hemorrhage.         Localized ttp and erythematous papule to the left lower eyelid.  No drainage.  Conjunctiva appears nml.    Neck: Normal range of motion. Neck supple.  Cardiovascular: Normal rate, regular rhythm, normal heart sounds and intact distal pulses.   No murmur heard. Pulmonary/Chest: Effort normal and breath sounds normal. No respiratory distress. She has no wheezes. She has no rales. She exhibits no tenderness.  Musculoskeletal: Normal range of motion. She exhibits edema and tenderness.       Left foot: She exhibits tenderness and swelling. She exhibits normal range of motion, no bony tenderness, normal capillary refill, no crepitus, no deformity and no laceration.       Feet:       Likely infected left great toenail.  Mild granulation tissue with erythema , edema and drainage present along the lateral nail  Lymphadenopathy:    She has no cervical adenopathy.  Neurological: She is alert and oriented to person, place, and time. She exhibits normal muscle tone. Coordination normal.  Skin: Skin is warm and dry. No rash noted.    ED Course  Procedures (including critical care time)  Labs Reviewed - No data to display   1. Ingrown toenail   2. Hordeolum externum of left lower eyelid       MDM     Pt currently taking coumadin for hx of PE.  Will give referral for podiatry for probable excision of the toenail.    Pt agrees to care plan and verbalized understanding.    The patient appears reasonably screened and/or stabilized for discharge and I doubt any other medical condition or other [facility] requiring further screening, evaluation, or treatment in the ED at this time prior to discharge.   Prescribed: Percocet #15 Keflex Erythromycin ophth oint (dispensed from ED)     Arlenne Kimbley L. [place], [place] 06/29/12 1754

## 2012-06-30 NOTE — ED Provider Notes (Signed)
Medical screening examination/treatment/procedure(s) were performed by non-physician practitioner and as supervising physician I was immediately available for consultation/collaboration.   Delora Fuel, MD [id number] [id number]

## 2012-07-04 ENCOUNTER — Emergency Department (HOSPITAL_COMMUNITY)
Admission: EM | Admit: 2012-07-04 | Discharge: 2012-07-04 | Disposition: A | Discharge status: Home / Self Care | Payer: Self-pay | Attending: Emergency Medicine | Admitting: Emergency Medicine

## 2012-07-04 ENCOUNTER — Encounter (HOSPITAL_COMMUNITY): Payer: Self-pay | Admitting: Emergency Medicine

## 2012-07-04 ENCOUNTER — Emergency Department (HOSPITAL_COMMUNITY): Payer: Self-pay

## 2012-07-04 DIAGNOSIS — Z86711 Personal history of pulmonary embolism: Secondary | ICD-10-CM | POA: Insufficient documentation

## 2012-07-04 DIAGNOSIS — M545 Low back pain, unspecified: Secondary | ICD-10-CM | POA: Insufficient documentation

## 2012-07-04 DIAGNOSIS — S22000A Wedge compression fracture of unspecified thoracic vertebra, initial encounter for closed fracture: Secondary | ICD-10-CM

## 2012-07-04 DIAGNOSIS — F329 Major depressive disorder, single episode, unspecified: Secondary | ICD-10-CM | POA: Insufficient documentation

## 2012-07-04 DIAGNOSIS — F3289 Other specified depressive episodes: Secondary | ICD-10-CM | POA: Insufficient documentation

## 2012-07-04 DIAGNOSIS — R109 Unspecified abdominal pain: Secondary | ICD-10-CM | POA: Insufficient documentation

## 2012-07-04 DIAGNOSIS — I10 Essential (primary) hypertension: Secondary | ICD-10-CM | POA: Insufficient documentation

## 2012-07-04 DIAGNOSIS — M5380 Other specified dorsopathies, site unspecified: Secondary | ICD-10-CM | POA: Insufficient documentation

## 2012-07-04 DIAGNOSIS — Z79899 Other long term (current) drug therapy: Secondary | ICD-10-CM | POA: Insufficient documentation

## 2012-07-04 DIAGNOSIS — S22009A Unspecified fracture of unspecified thoracic vertebra, initial encounter for closed fracture: Secondary | ICD-10-CM | POA: Insufficient documentation

## 2012-07-04 DIAGNOSIS — E119 Type 2 diabetes mellitus without complications: Secondary | ICD-10-CM | POA: Insufficient documentation

## 2012-07-04 DIAGNOSIS — Z7901 Long term (current) use of anticoagulants: Secondary | ICD-10-CM | POA: Insufficient documentation

## 2012-07-04 DIAGNOSIS — Z794 Long term (current) use of insulin: Secondary | ICD-10-CM | POA: Insufficient documentation

## 2012-07-04 DIAGNOSIS — R404 Transient alteration of awareness: Secondary | ICD-10-CM | POA: Insufficient documentation

## 2012-07-04 LAB — GLUCOSE, CAPILLARY

## 2012-07-04 MED ORDER — ONDANSETRON 8 MG PO TBDP
8.0000 mg | ORAL_TABLET | Freq: Once | ORAL | Status: AC
  Administered 2012-07-04: 8 mg via ORAL
  Filled 2012-07-04: qty 1

## 2012-07-04 MED ORDER — OXYCODONE-ACETAMINOPHEN 7.5-325 MG PO TABS: 1.0000 | ORAL_TABLET | ORAL | Status: DC | PRN

## 2012-07-04 MED ORDER — CYCLOBENZAPRINE HCL 5 MG PO TABS: 5.0000 mg | ORAL_TABLET | Freq: Three times a day (TID) | ORAL | Status: DC | PRN

## 2012-07-04 NOTE — ED Notes (Addendum)
Pt pulled muscle to mid back area mostly to right side yesterday. Pt c/o pain 10. Took 3 hydrocodones 10/325 at 8am this am with no relief. Nad. Alert/oriented.

## 2012-07-04 NOTE — ED Provider Notes (Signed)
History     CSN: [id number]  Arrival date & time 07/04/12  [id number]   First MD Initiated Contact with Patient 07/04/12 0935      Chief Complaint  Patient presents with  . Back Pain    (Consider location/radiation/quality/duration/timing/severity/associated sxs/prior treatment) HPI Comments: Summer Cook presents with severe lower back and right lower flank pain which started suddenly 4 days ago when she was doing stretching exercises and felt a sudden pop at this site.  She also mentions falling landing on her buttocks several weeks ago,  But "thought nothing of it".  She denies weakness and numbnes in her legs and has had no urinary or fecal incontinence or retention.  She does use hydrocodone prn for a history of chronic back pain,  But has doubled and even tripled up on these over the weekend in order to get her pain under better control.  Her last dose was at 8 am and took 3 tablets of the 10/325 mg strength.  She is drowsy, but reports pain is better.  The history is provided by the patient.    Past Medical History  Diagnosis Date  . Essential hypertension, benign   . ITP (idiopathic thrombocytopenic purpura)   . Thrombocytopenia     Received Cytoxan PO. Last dose 12/11/11  . Depression   . Thrush, oral 05/29/2011  . Evan's syndrome   . Type 2 diabetes mellitus   . Pulmonary embolism     Past Surgical History  Procedure Date  . Cholecystectomy   . Bone marrow biopsy   . Splenectomy, total     Family History  Problem Relation Age of Onset  . Cancer Mother   . Cervical cancer Mother   . Cancer Father   . Lung cancer Father   . Pneumonia Brother     History  Substance Use Topics  . Smoking status: Former Smoker    Types: Cigarettes    Quit date: 02/15/2011  . Smokeless tobacco: Never Used  . Alcohol Use: No    OB History    Grav Para Term Preterm Abortions TAB SAB Ect Mult Living                  Review of Systems  Constitutional: Negative for fever.    Respiratory: Negative for shortness of breath.   Cardiovascular: Negative for chest pain and leg swelling.  Gastrointestinal: Negative for abdominal pain, constipation and abdominal distention.  Genitourinary: Negative for dysuria, urgency, frequency, flank pain and difficulty urinating.  Musculoskeletal: Positive for back pain. Negative for joint swelling and gait problem.  Skin: Negative for rash.  Neurological: Negative for weakness and numbness.    Allergies  Yellow jacket venom  Home Medications   Current Outpatient Rx  Name Route Sig Dispense Refill  . ACYCLOVIR 400 MG PO TABS Oral Take 400 mg by mouth every morning.     . ALBUTEROL SULFATE HFA 108 (90 BASE) MCG/ACT IN AERS Inhalation Inhale 2 puffs into the lungs every 4 (four) hours as needed. For shortness of breath    . ALPRAZOLAM 0.5 MG PO TABS Oral Take 0.5-1 mg by mouth daily as needed. *May take one tablet by mouth during the day if/as needed    . CEPHALEXIN 500 MG PO CAPS Oral Take 500 mg by mouth 4 (four) times daily. For 10 days    . ERYTHROMYCIN 5 MG/GM OP OINT Topical Apply 1 application topically at bedtime. Applies to infection on toe.    Marland Kitchen  FLUCONAZOLE 100 MG PO TABS Oral Take 100 mg by mouth daily.    . INSULIN ASPART 100 UNIT/ML [place] SOLN Subcutaneous Inject into the skin 3 (three) times daily as needed. Sliding scale    . METOPROLOL SUCCINATE ER 25 MG PO TB24 Oral Take 25 mg by mouth every morning.     Marland Kitchen MYCOPHENOLATE MOFETIL 250 MG PO CAPS Oral Take 250-500 mg by mouth 2 (two) times daily. Take two capsules in the morning and one capsule in the evening    . PREDNISONE 10 MG PO TABS Oral Take 10 mg by mouth daily.    . TRIAMTERENE-HCTZ 37.5-25 MG PO TABS Oral Take 2 tablets by mouth Daily.    . WARFARIN SODIUM 1 MG PO TABS Oral Take 4 mg by mouth every evening.    . CYCLOBENZAPRINE HCL 5 MG PO TABS Oral Take 1 tablet (5 mg total) by mouth 3 (three) times daily as needed for muscle spasms. 15 tablet 0  .  OXYCODONE-ACETAMINOPHEN 7.5-325 MG PO TABS Oral Take 1 tablet by mouth every 4 (four) hours as needed for pain. 20 tablet 0    BP 102/65  Pulse 118  Temp 97.8 F (36.6 C) (Oral)  Resp 19  SpO2 97%  Physical Exam  Nursing note and vitals reviewed. Constitutional: She appears well-developed and well-nourished.  HENT:  Head: Normocephalic.  Eyes: Conjunctivae normal are normal.  Neck: Normal range of motion. Neck supple.  Cardiovascular: Normal rate and intact distal pulses.        Pedal pulses normal.  Pulmonary/Chest: Effort normal.  Abdominal: Soft. Bowel sounds are normal. She exhibits no distension and no mass.  Musculoskeletal: Normal range of motion. She exhibits no edema.       Lumbar back: She exhibits tenderness. She exhibits no swelling, no edema and no spasm.       TTP midline lumbar and right paralumbar with muscle spasm.  No cva tenderness.  Neurological: She is alert. She has normal strength. She displays no atrophy and no tremor. No sensory deficit. Gait normal.  Reflex Scores:      Patellar reflexes are 2+ on the right side and 2+ on the left side.      Achilles reflexes are 2+ on the right side and 2+ on the left side.      No strength deficit noted in hip and knee flexor and extensor muscle groups.  Ankle flexion and extension intact.  Skin: Skin is warm and dry.  Psychiatric: She has a normal mood and affect.    ED Course  Procedures (including critical care time)  Labs Reviewed  GLUCOSE, CAPILLARY - Abnormal; Notable for the following:    Glucose-Capillary 101 (*)     All other components within normal limits   Dg Lumbar Spine Complete  07/04/2012  *RADIOLOGY REPORT*  Clinical Data: Pt stated she was stretching Friday morning and her back popped. She had a sudden onset of severe low back pain from that moment on. She says it hurts more on the rt side of her lower back.  LUMBAR SPINE - COMPLETE 4+ VIEW  Comparison: 09/04/2011  Findings: Subtle levoconvex  lumbar scoliosis noted on the frontal projection.  As on the prior CT scan, there is mild spurring anterior to the vertebral body column of the L3-4 and L4-5 levels, with a broad Schmorl's node along the superior endplate of L3.  Subtle superior endplate compression fracture is probably new compared to the chest radiograph of 05/02/2012.  IMPRESSION:  1.  Subtle wedging of the T12 vertebral body appears to be new compared to 05/02/2012 and suggests a mild superior endplate compression fracture. 2.  Mild spurring at L3-4 and L4-5.   Original Report Authenticated By: Carron Curie, M.D.      1. Thoracic compression fracture       MDM  Patient referred back to her PCP.  X-rays were reviewed with patient, discussed possible kyphoplasty which may resolve her pain.  She can discuss this with her primary physician.  She was also referred to Dr. Luna Glasgow as an alternative for further management of his back injury.  She was prescribed oxycodone 7.5/325 mg in place of her hydrocodone.  Time was spent discussing with patient using caution and potential bad side effects from taking too much pain medications and dangers of Tylenol overdose.  She has not overdosed, as she has been taking her medication every 6-8 hours.  She was also prescribed Flexeril as there does seem to be a component of muscle spasm on her exam.  Pt does not present with chest pain, shortness of breath or other sx suggestive of coronary complaint.  Presentation and exam consistent with musculoskeletal source of pain.  She has close f/u with Dr Gerarda Fraction.        Evalee Jefferson, PA 07/04/12 1132     Date: 07/04/2012  Rate: 101  Rhythm: sinus tachycardia  QRS Axis: right  Intervals: normal  ST/T Wave abnormalities: nonspecific T wave changes  Conduction Disutrbances:none  Narrative Interpretation: new T wave inversion in inferior leads.  Old EKG Reviewed: changes noted    Evalee Jefferson, PA 07/04/12 [place],  [place] 07/04/12 [phone]

## 2012-07-04 NOTE — ED Notes (Signed)
Pt diaphoretic. MD aware

## 2012-07-05 NOTE — ED Provider Notes (Signed)
Medical screening examination/treatment/procedure(s) were performed by non-physician practitioner and as supervising physician I was immediately available for consultation/collaboration.   Mervin Kung, MD 07/05/12 [phone]

## 2012-07-10 IMAGING — CR DG CHEST 1V PORT
1 series · 1 of 1 positions shown · non-contrast
Comparison: 12/10/2010

CLINICAL DATA: Chest pain

CHEST - 1 VIEW

[view not recorded]
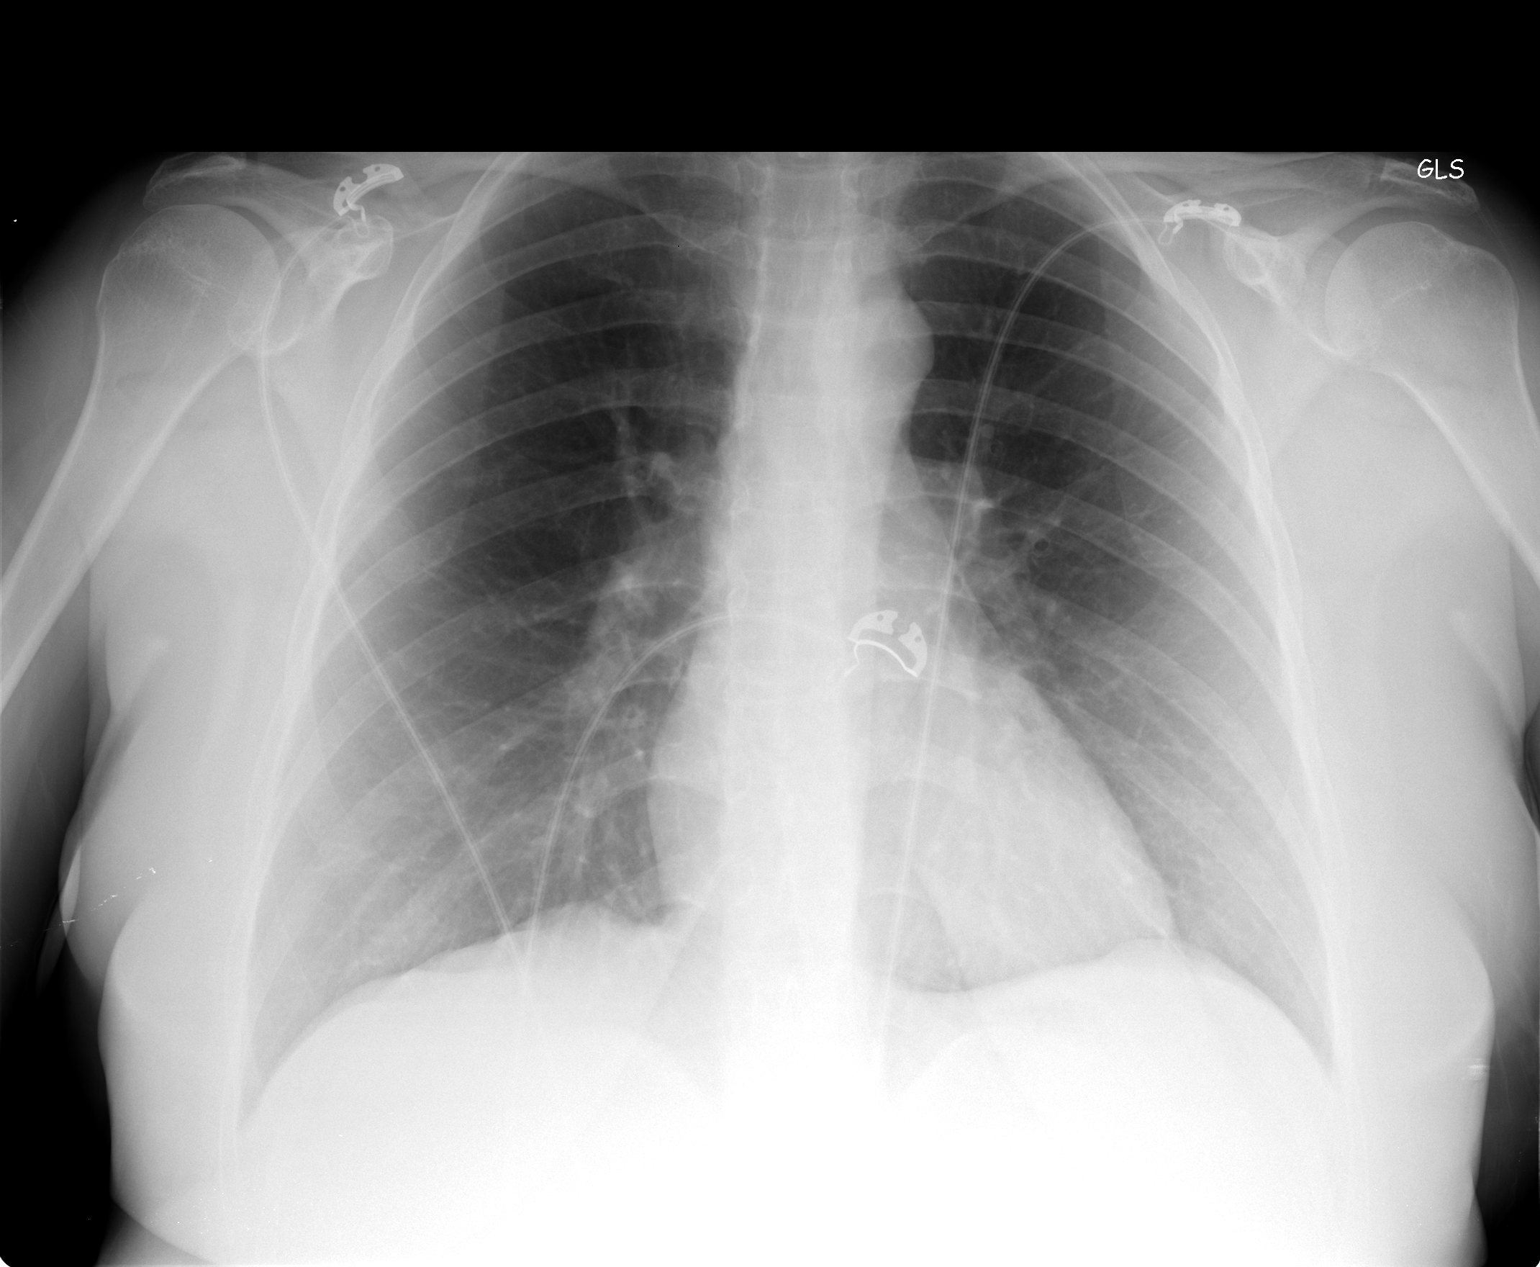

[1 of 1 positions shown; findings below may reference images not displayed]

FINDINGS: The heart size and mediastinal contours are within normal
limits.  Both lungs are clear.
IMPRESSION: No active disease.

## 2012-07-11 IMAGING — US US EXTREM LOW VENOUS BILAT
1 series · 14 of 24 positions shown · non-contrast
Comparison: 12/10/2010

CLINICAL DATA: Elevated D-dimer, leg swelling left greater than
right.

BILATERAL LOWER EXTREMITY VENOUS DOPPLER ULTRASOUND
TECHNIQUE: Gray-scale sonography with compression, as well as color
and duplex ultrasound, were performed to evaluate the deep venous
system from the level of the common femoral vein through the
popliteal and proximal calf veins.

[Series 1: us extrem low venous bilat · 14 of 55 slices shown]
[im 1/55]
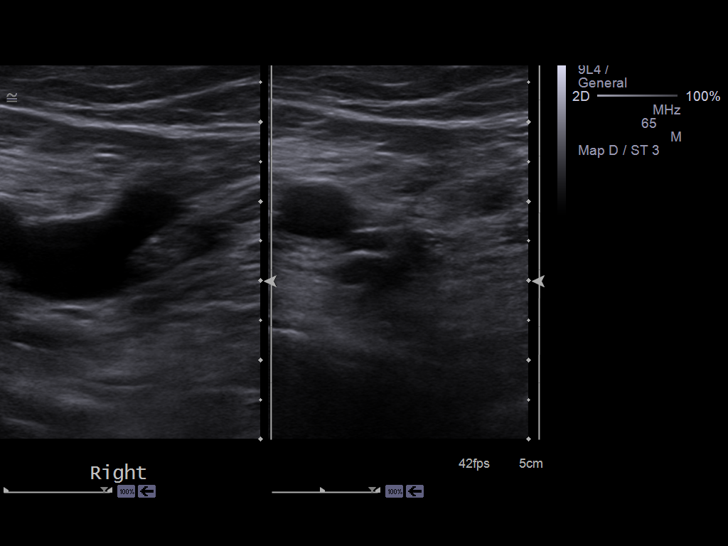
[im 5/55]
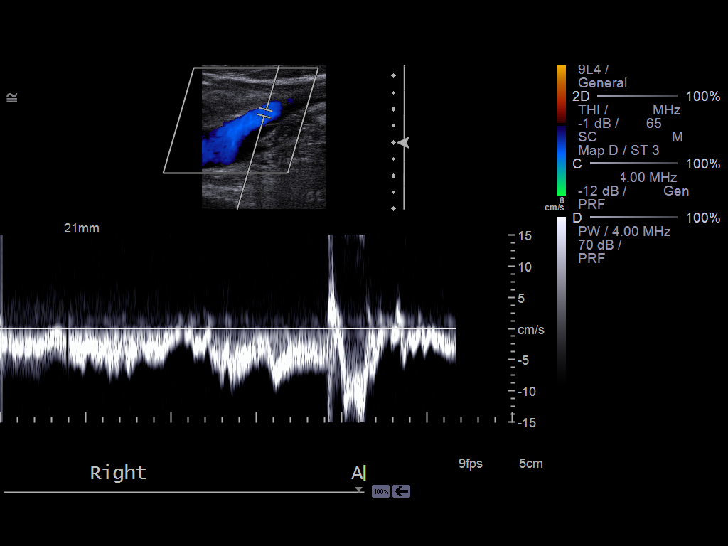
[im 10/55]
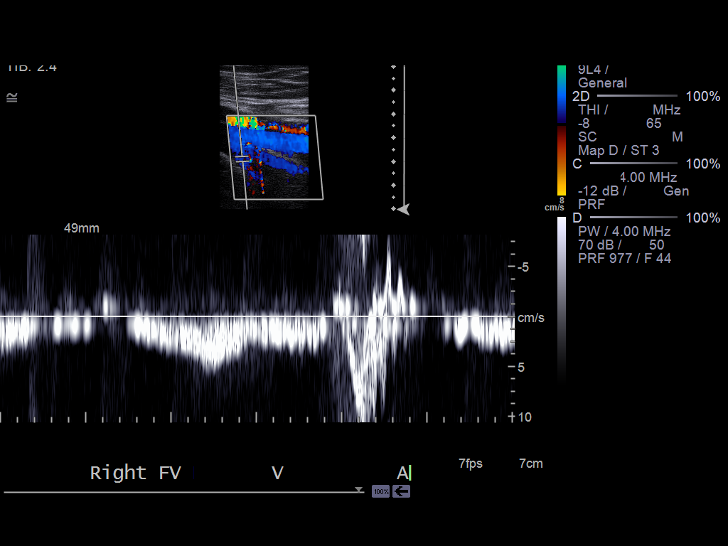
[im 15/55]
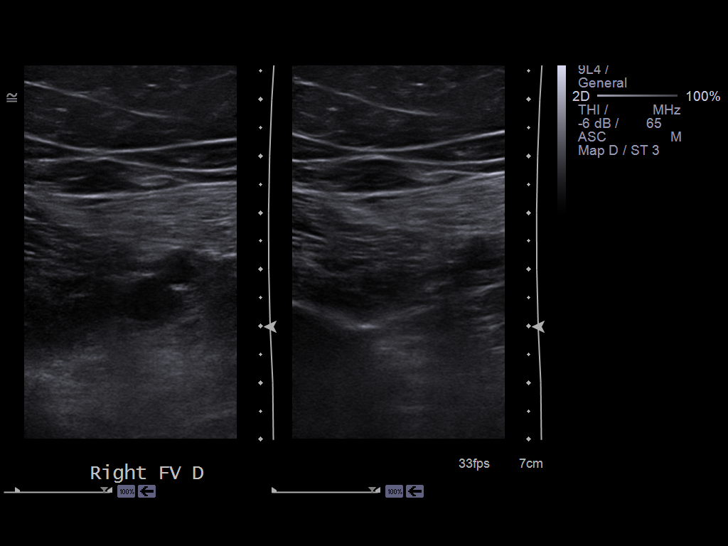
[im 17/55]
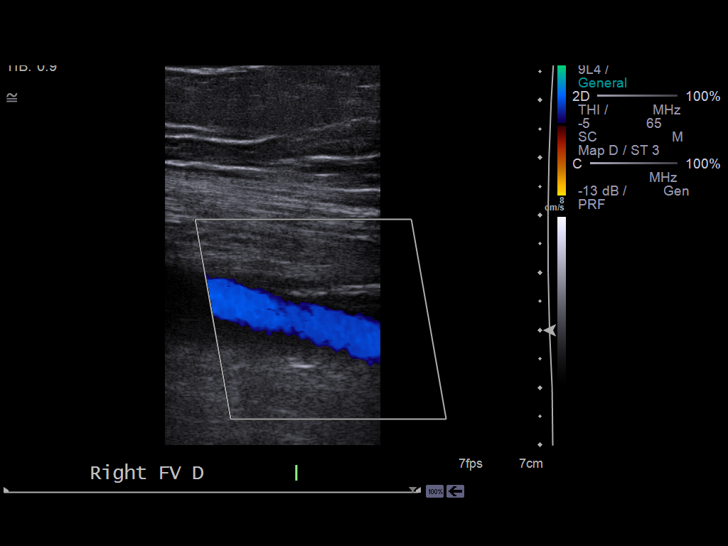
[im 22/55]
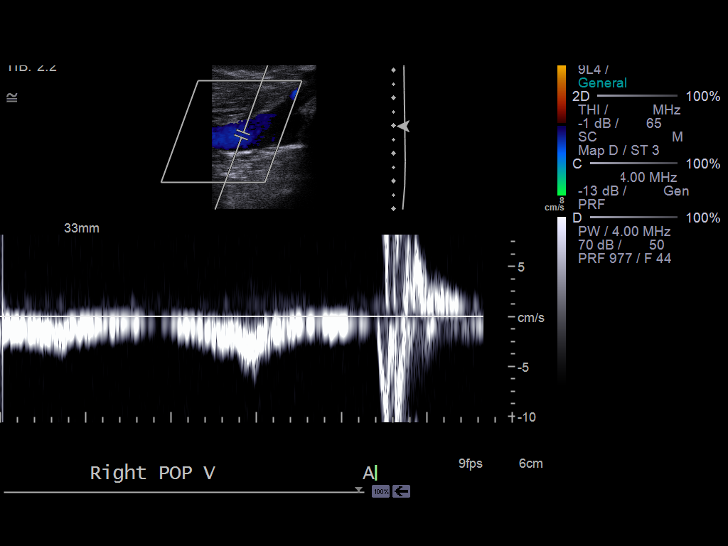
[im 26/55]
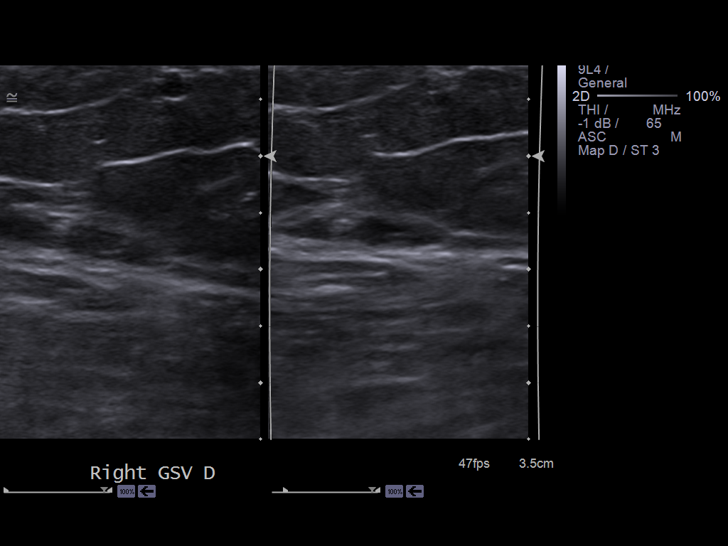
[im 29/55]
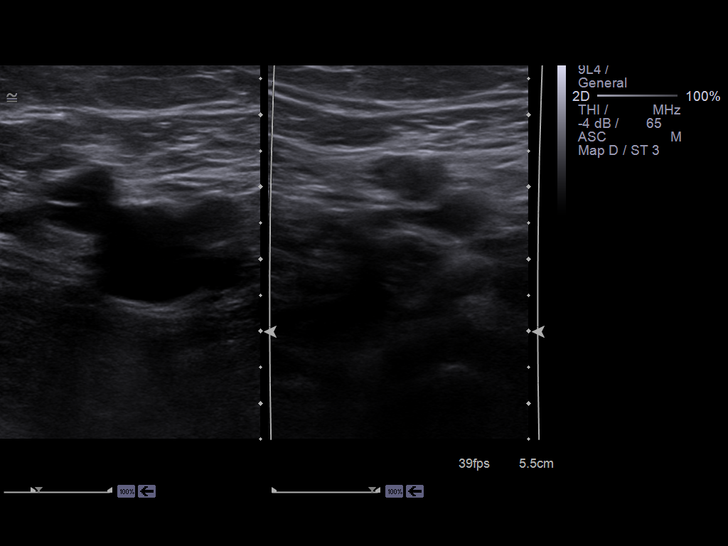
[im 33/55]
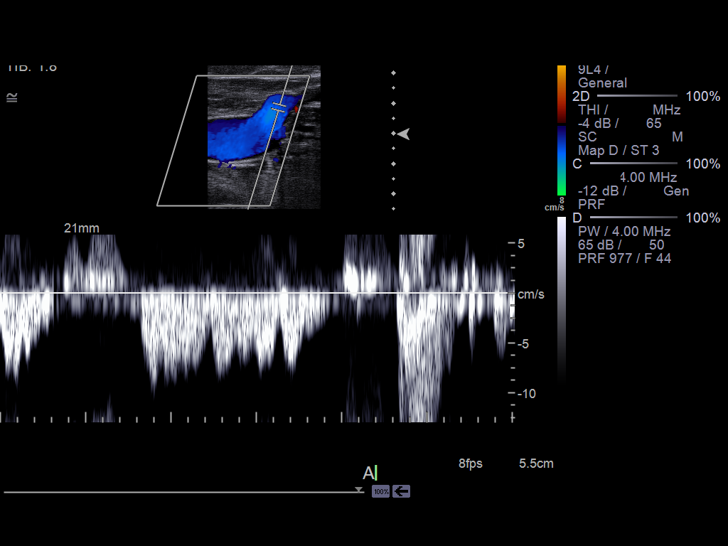
[im 38/55]
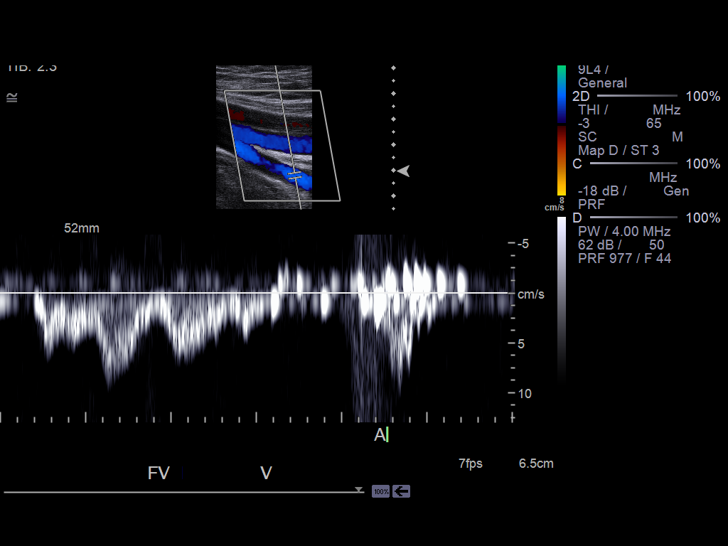
[im 43/55]
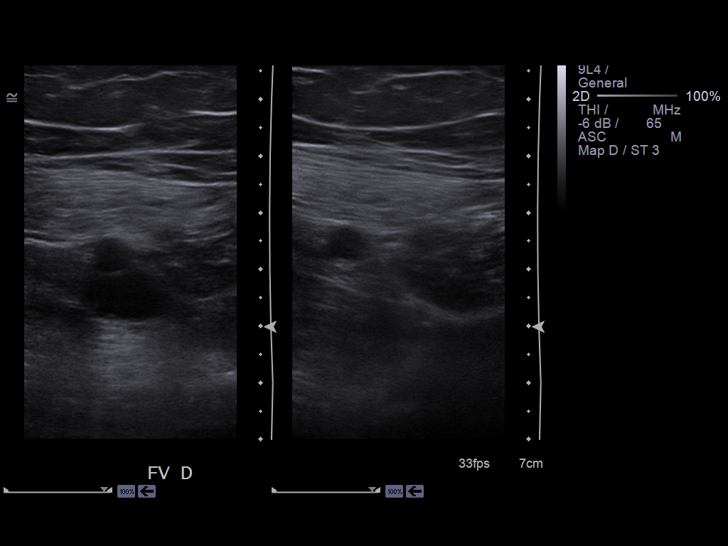
[im 45/55]
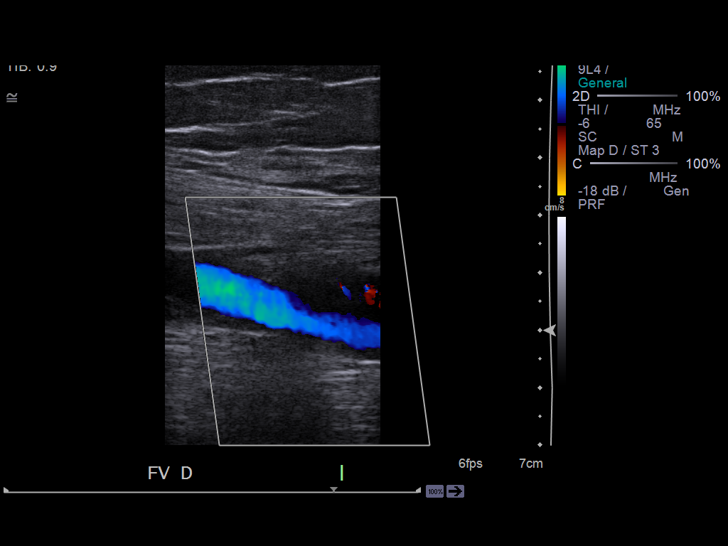
[im 50/55]
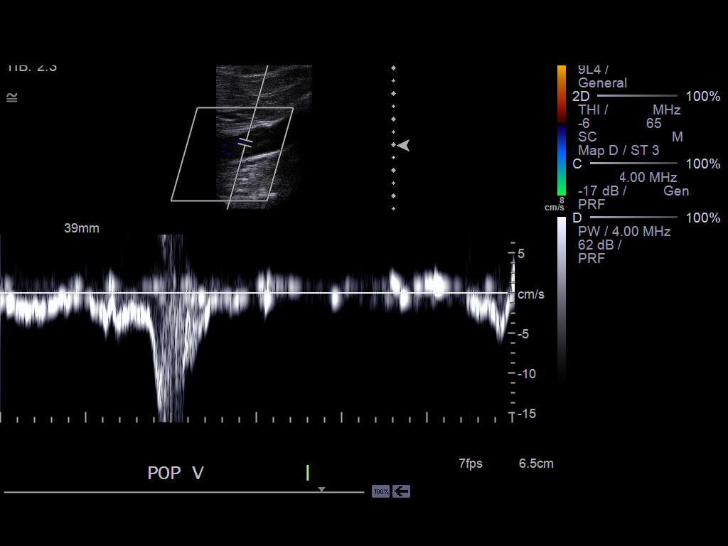
[im 55/55]
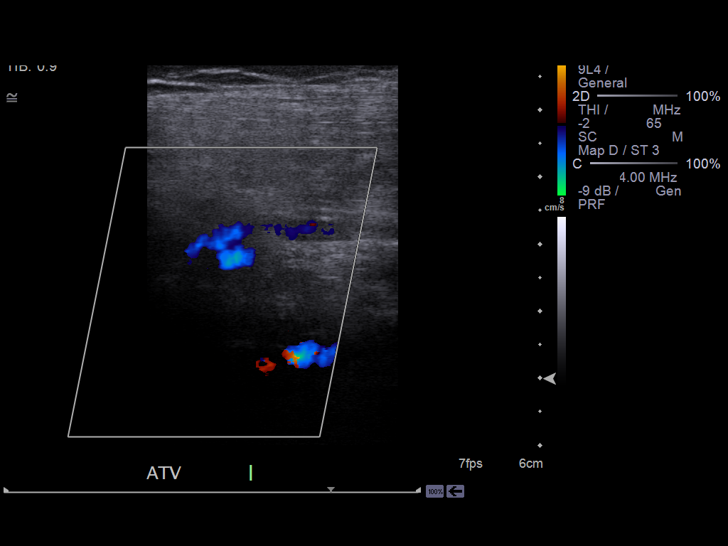

[14 of 24 positions shown; findings below may reference images not displayed]

FINDINGS: Normal compressibility of  the common femoral,
superficial femoral, and popliteal veins, as well as the proximal
calf veins.  No filling defects to suggest DVT on grayscale or
color Doppler imaging.  Doppler waveforms show normal direction of
venous flow, normal respiratory phasicity and response to
augmentation.
IMPRESSION: No evidence of  lower extremity deep vein thrombosis.

## 2012-07-11 IMAGING — CT CT ANGIO CHEST
1 of 6 series · 5 of 36 positions shown · IV contrast (Omnipaque 300)
Comparison: None

CLINICAL DATA: Shortness of breath, chest pain, chest pressure,
hypertension, question pulmonary embolism

CT ANGIOGRAPHY CHEST WITH CONTRAST
TECHNIQUE: Multidetector CT imaging of the chest was performed
using the standard protocol during bolus administration of
intravenous contrast.  Multiplanar CT image reconstructions
including MIPs were obtained to evaluate the vascular anatomy.
Contrast:  100 ml Omnipaque 300 IV

[Series 4: pe 3.0 b40f · axial · 0.68mm/px · z∈[-236,-53]mm · 5 of 93 slices shown]
[im 16/93  lung]
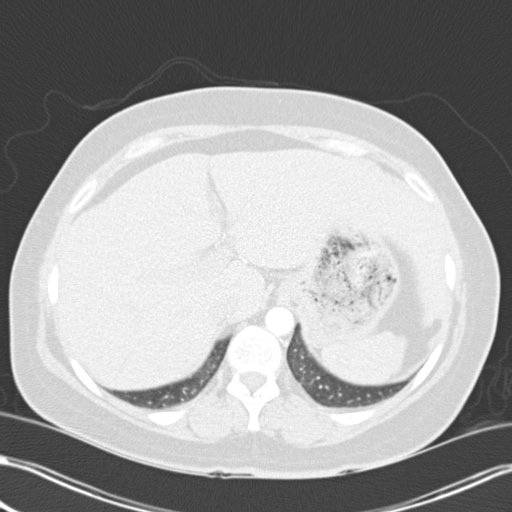
[im 31/93  mediastinal]
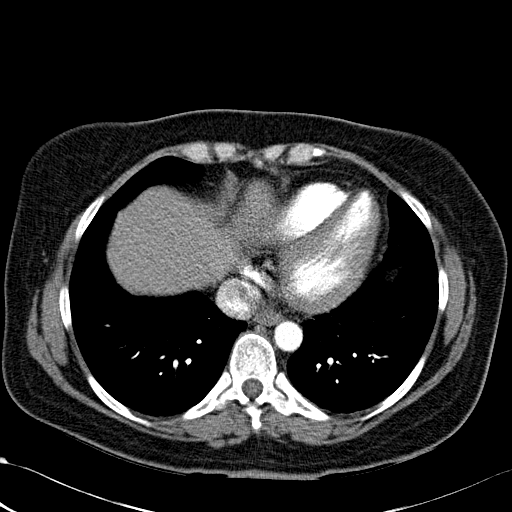
[im 47/93  lung]
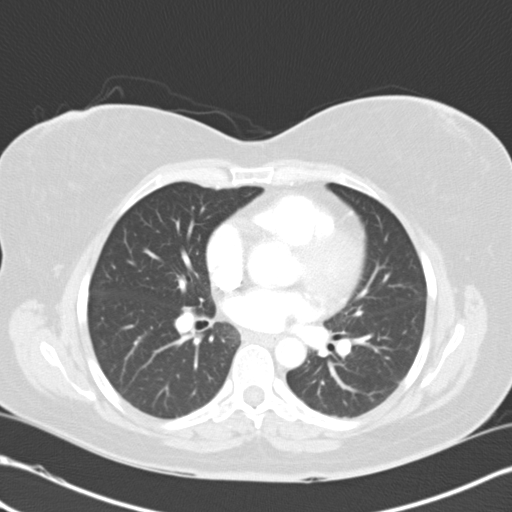
[im 62/93  mediastinal]
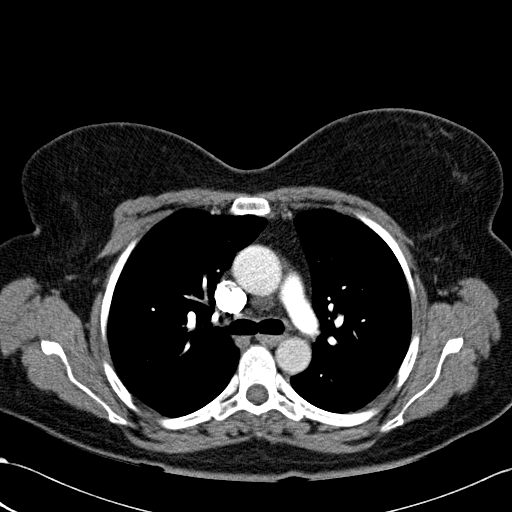
[im 77/93  lung]
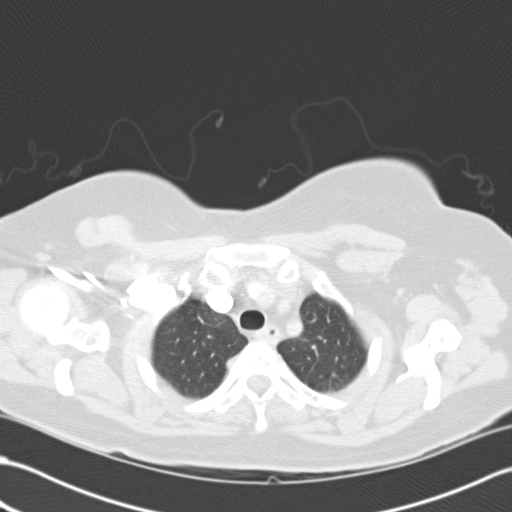

[5 of 36 positions shown; findings below may reference images not displayed]

FINDINGS: Aorta normal caliber without aneurysm or dissection.
Pulmonary arteries patent.
No evidence of pulmonary embolism.
Very minimal atelectasis dependently in left lower lobe.
Lungs otherwise clear.
No pleural effusion or pneumothorax.
Visualized portion of upper abdomen significant only for
cholecystectomy.
No thoracic adenopathy.
Bones unremarkable.

Review of the MIP images confirms the above findings.
IMPRESSION: No evidence of pulmonary embolism.
No significant intrathoracic findings.

Findings called to Dr. Rithvik on 02/03/2011 at 9812 hours.

## 2012-07-29 ENCOUNTER — Emergency Department (HOSPITAL_COMMUNITY)
Admission: EM | Admit: 2012-07-29 | Discharge: 2012-07-29 | Disposition: A | Discharge status: Home / Self Care | Payer: Self-pay | Attending: Emergency Medicine | Admitting: Emergency Medicine

## 2012-07-29 ENCOUNTER — Emergency Department (HOSPITAL_COMMUNITY): Payer: Self-pay

## 2012-07-29 ENCOUNTER — Encounter (HOSPITAL_COMMUNITY): Payer: Self-pay

## 2012-07-29 DIAGNOSIS — Z87891 Personal history of nicotine dependence: Secondary | ICD-10-CM | POA: Insufficient documentation

## 2012-07-29 DIAGNOSIS — D693 Immune thrombocytopenic purpura: Secondary | ICD-10-CM | POA: Insufficient documentation

## 2012-07-29 DIAGNOSIS — E119 Type 2 diabetes mellitus without complications: Secondary | ICD-10-CM | POA: Insufficient documentation

## 2012-07-29 DIAGNOSIS — F329 Major depressive disorder, single episode, unspecified: Secondary | ICD-10-CM | POA: Insufficient documentation

## 2012-07-29 DIAGNOSIS — F3289 Other specified depressive episodes: Secondary | ICD-10-CM | POA: Insufficient documentation

## 2012-07-29 DIAGNOSIS — Z79899 Other long term (current) drug therapy: Secondary | ICD-10-CM | POA: Insufficient documentation

## 2012-07-29 DIAGNOSIS — Z794 Long term (current) use of insulin: Secondary | ICD-10-CM | POA: Insufficient documentation

## 2012-07-29 DIAGNOSIS — R0789 Other chest pain: Secondary | ICD-10-CM

## 2012-07-29 DIAGNOSIS — Z86711 Personal history of pulmonary embolism: Secondary | ICD-10-CM | POA: Insufficient documentation

## 2012-07-29 DIAGNOSIS — R071 Chest pain on breathing: Secondary | ICD-10-CM | POA: Insufficient documentation

## 2012-07-29 DIAGNOSIS — I10 Essential (primary) hypertension: Secondary | ICD-10-CM | POA: Insufficient documentation

## 2012-07-29 DIAGNOSIS — Z91038 Other insect allergy status: Secondary | ICD-10-CM | POA: Insufficient documentation

## 2012-07-29 DIAGNOSIS — R000 Tachycardia, unspecified: Secondary | ICD-10-CM | POA: Insufficient documentation

## 2012-07-29 LAB — BASIC METABOLIC PANEL
CO2: 21 mEq/L (ref 19–32)
Chloride: 99 mEq/L (ref 96–112)
Glucose, Bld: 172 mg/dL — ABNORMAL HIGH (ref 70–99)
Potassium: 3.5 mEq/L (ref 3.5–5.1)
Sodium: 136 mEq/L (ref 135–145)

## 2012-07-29 LAB — CBC WITH DIFFERENTIAL/PLATELET
Basophils Relative: 0 % (ref 0–1)
Eosinophils Relative: 0 % (ref 0–5)
Hemoglobin: 13 g/dL (ref 12.0–15.0)
MCH: 23.9 pg — ABNORMAL LOW (ref 26.0–34.0)
Monocytes Absolute: 0.4 10*3/uL (ref 0.1–1.0)
Monocytes Relative: 2 % — ABNORMAL LOW (ref 3–12)
Neutrophils Relative %: 95 % — ABNORMAL HIGH (ref 43–77)
RBC: 5.43 MIL/uL — ABNORMAL HIGH (ref 3.87–5.11)
WBC: 18.5 10*3/uL — ABNORMAL HIGH (ref 4.0–10.5)

## 2012-07-29 LAB — TROPONIN I
Troponin I: <0.3 ng/mL (ref <0.30)
Troponin I: <0.3 ng/mL (ref <0.30)

## 2012-07-29 LAB — D-DIMER, QUANTITATIVE: D-Dimer, Quant: <0.27 ug/mL-FEU (ref 0.00–0.48)

## 2012-07-29 LAB — PROTIME-INR
INR: 1.27 (ref 0.00–1.49)
Prothrombin Time: 15.6 seconds — ABNORMAL HIGH (ref 11.6–15.2)

## 2012-07-29 IMAGING — CR DG ABDOMEN ACUTE W/ 1V CHEST
3 series · 3 of 3 positions shown · non-contrast
Comparison: Chest x-ray dated 12/10/2010 and abdominal CT scan
dated 10/08/2010

CLINICAL DATA: Abdominal pain and nausea.  Splenectomy 5 days ago.

ACUTE ABDOMEN SERIES (ABDOMEN 2 VIEW & CHEST 1 VIEW)

[view not recorded (1 of 3)]
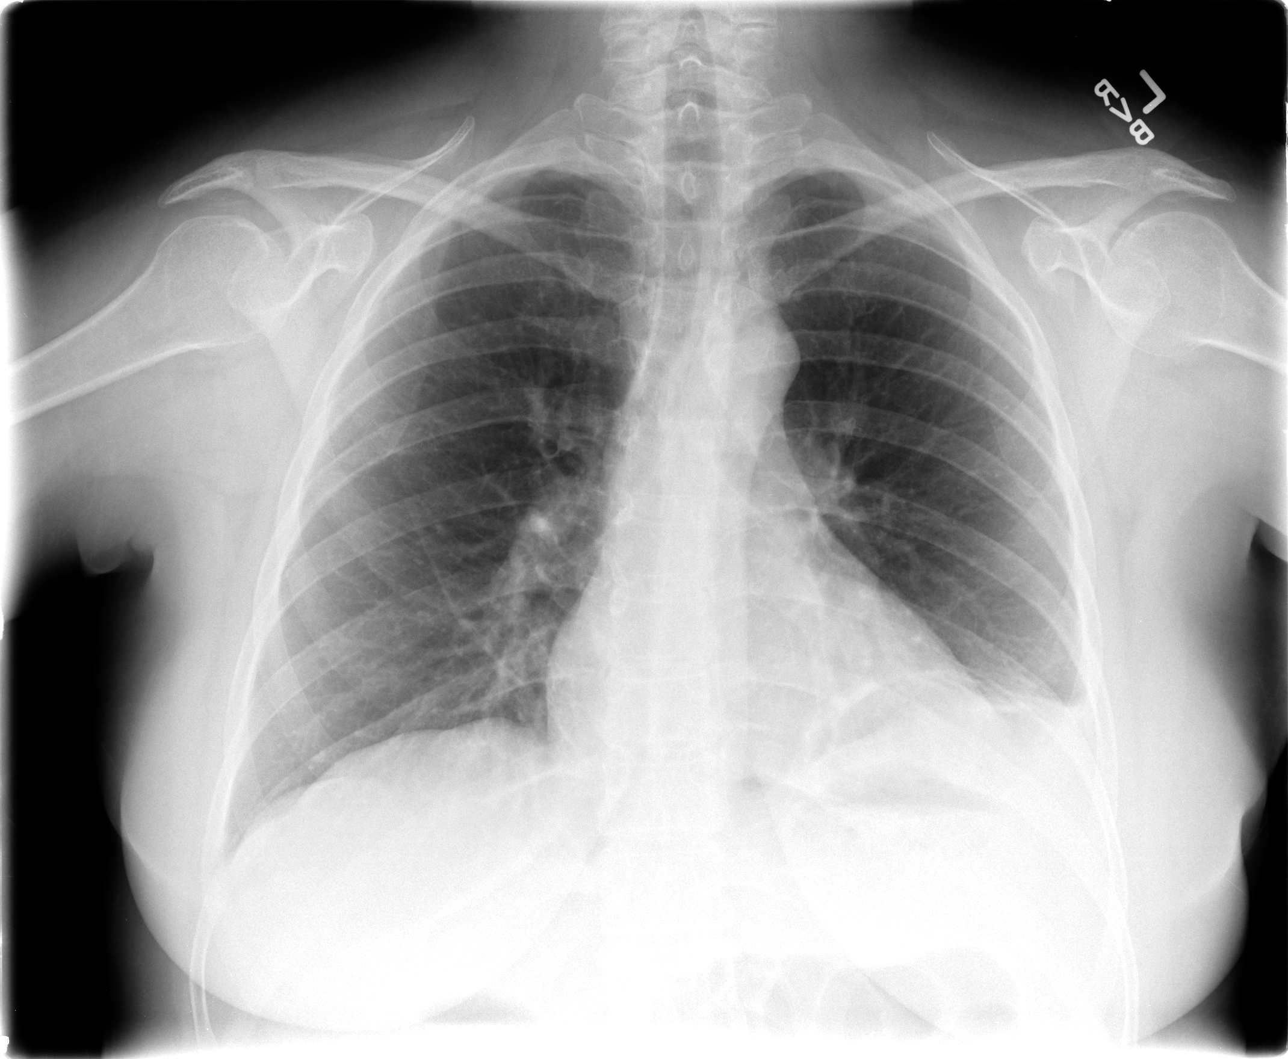

[view not recorded (2 of 3)]
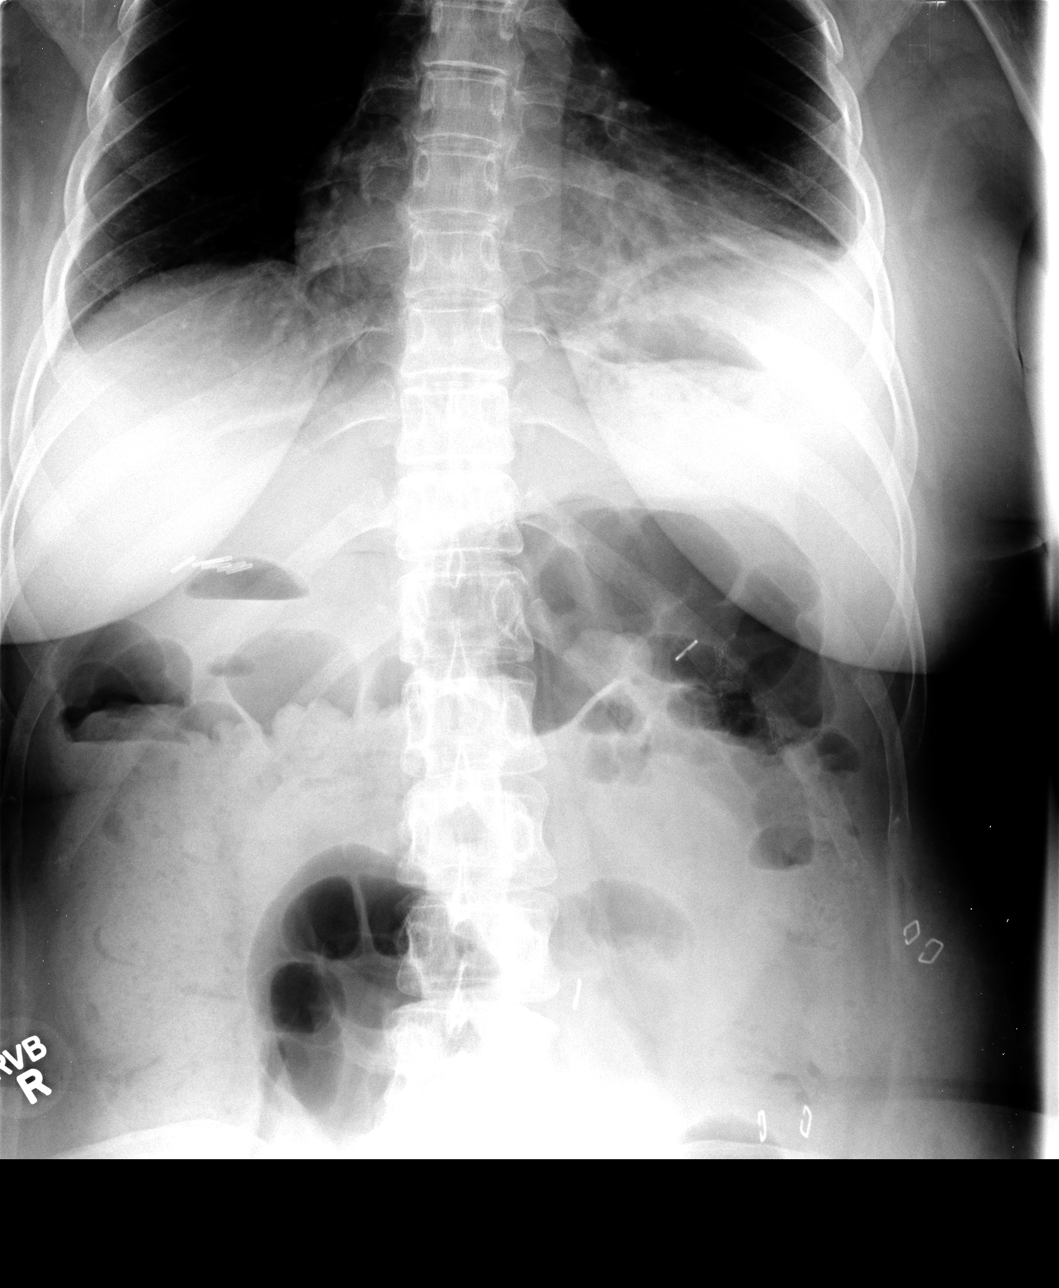

[view not recorded (3 of 3)]
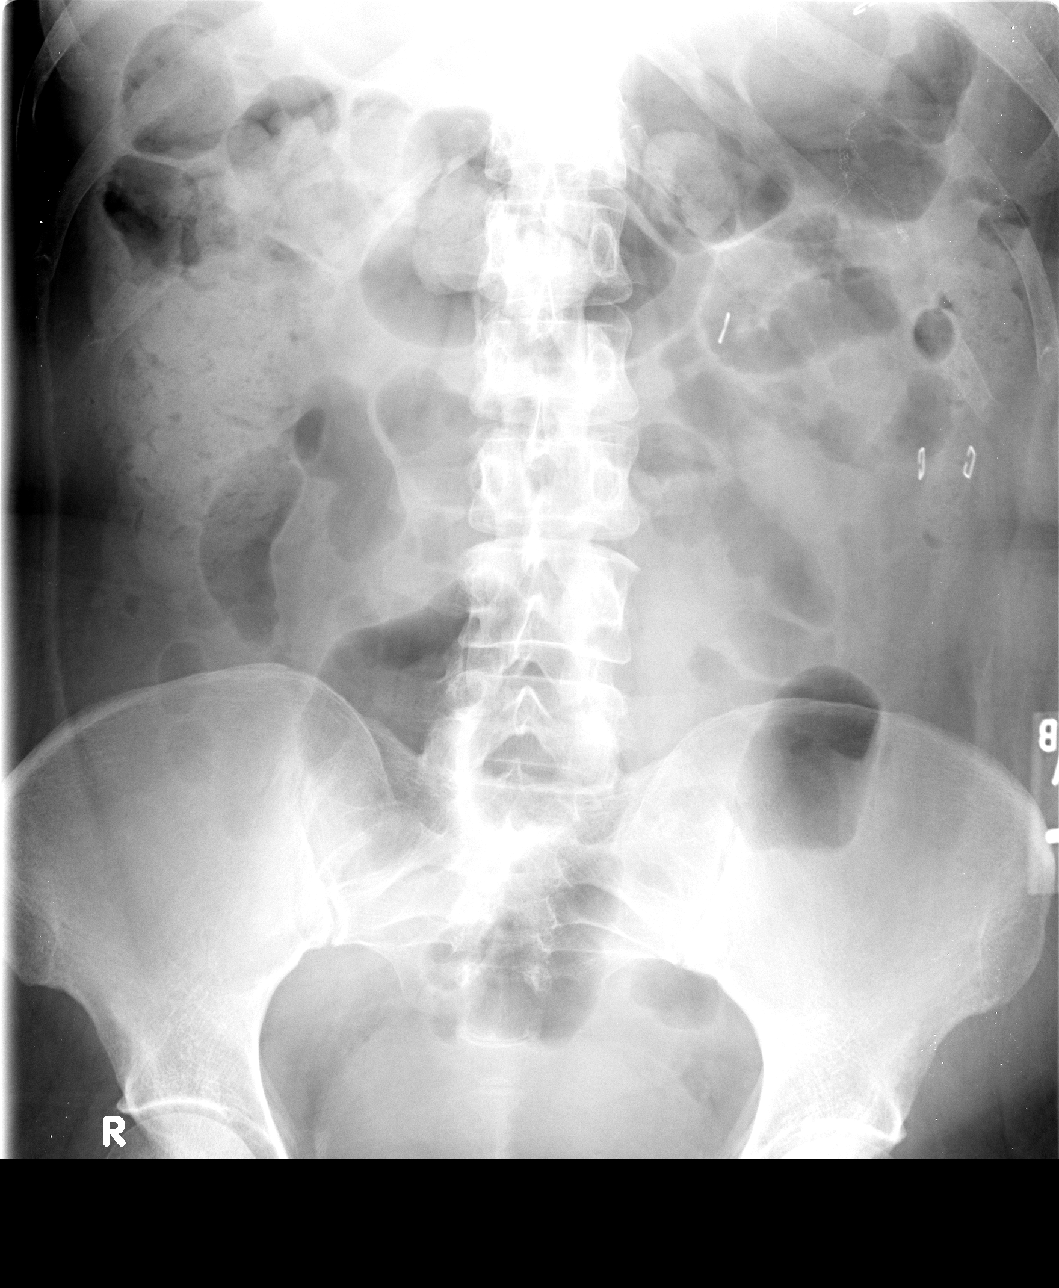

[3 of 3 positions shown; findings below may reference images not displayed]

FINDINGS: The heart size and vascularity are normal.  There is
slight linear atelectasis at the left lung base.  Right lung is
clear.  There is no free air in the abdomen.  There are no dilated
loops of large or small bowel.  There is moderate stool in the
colon without a fecal impaction. Surgical staple line is seen in
the left upper quadrant.
IMPRESSION: 1.  Mild atelectasis at the left lung base.
2.  Benign-appearing abdomen except for postsurgical changes.

## 2012-07-29 MED ORDER — METOPROLOL TARTRATE 50 MG PO TABS: 50.0000 mg | ORAL_TABLET | Freq: Two times a day (BID) | ORAL | Status: DC

## 2012-07-29 MED ORDER — ONDANSETRON HCL 4 MG/2ML IJ SOLN
4.0000 mg | Freq: Once | INTRAMUSCULAR | Status: AC
  Administered 2012-07-29: 4 mg via INTRAVENOUS
  Filled 2012-07-29: qty 2

## 2012-07-29 MED ORDER — TRAMADOL HCL 50 MG PO TABS
50.0000 mg | ORAL_TABLET | Freq: Once | ORAL | Status: AC
  Administered 2012-07-29: 50 mg via ORAL
  Filled 2012-07-29: qty 1

## 2012-07-29 MED ORDER — CYCLOBENZAPRINE HCL 10 MG PO TABS: 10.0000 mg | ORAL_TABLET | Freq: Three times a day (TID) | ORAL | Status: DC | PRN

## 2012-07-29 MED ORDER — MORPHINE SULFATE 2 MG/ML IJ SOLN
INTRAMUSCULAR | Status: AC
  Administered 2012-07-29: 4 mg via INTRAVENOUS
  Filled 2012-07-29: qty 2

## 2012-07-29 MED ORDER — CYCLOBENZAPRINE HCL 10 MG PO TABS
10.0000 mg | ORAL_TABLET | Freq: Once | ORAL | Status: AC
  Administered 2012-07-29: 10 mg via ORAL
  Filled 2012-07-29: qty 1

## 2012-07-29 MED ORDER — SODIUM CHLORIDE 0.9 % IV SOLN
Freq: Once | INTRAVENOUS | Status: AC
  Administered 2012-07-29: 1000 mL via INTRAVENOUS

## 2012-07-29 MED ORDER — MORPHINE SULFATE 4 MG/ML IJ SOLN: 4.0000 mg | Freq: Once | INTRAMUSCULAR | Status: DC

## 2012-07-29 MED ORDER — TRAMADOL-ACETAMINOPHEN 37.5-325 MG PO TABS: ORAL_TABLET | ORAL | Status: DC

## 2012-07-29 MED ORDER — SODIUM CHLORIDE 0.9 % IV BOLUS (SEPSIS)
1000.0000 mL | Freq: Once | INTRAVENOUS | Status: AC
  Administered 2012-07-29: 1000 mL via INTRAVENOUS

## 2012-07-29 MED ORDER — METOPROLOL TARTRATE 25 MG PO TABS
25.0000 mg | ORAL_TABLET | Freq: Once | ORAL | Status: AC
  Administered 2012-07-29: 25 mg via ORAL
  Filled 2012-07-29: qty 1

## 2012-07-29 NOTE — ED Notes (Signed)
Pt reports pain in chest is worse with deep breaths and is a little tender to touch.  Pt says is in the process of moving but says hasn't been lifting anything.

## 2012-07-29 NOTE — ED Notes (Signed)
Pt alert, oriented, pleasant.  Pt eating a snack.

## 2012-07-29 NOTE — ED Provider Notes (Signed)
History   This chart was scribed for Summer Norrie, MD by Hampton Abbot. This patient was seen in room APA19/APA19 and the patient's care was started at 12:00.   CSN: [id number]  Arrival date & time 07/29/12  79   First MD Initiated Contact with Patient 07/29/12 1109      Chief Complaint  Patient presents with  . Chest Pain    (Consider location/radiation/quality/duration/timing/severity/associated sxs/prior treatment) The history is provided by the patient. No language interpreter was used.   Summer Cook is a 46 y.o. female with h/o ITP who presents to the Emergency Department complaining of "my heart". States she has waxing-and-waning "constricting heart pain" in her left upper chest and chest tightness and heaviness in center  onset 30 minutes after waking up about 5 am, the "constricting pain"  lasts 5 seconds at a time and is a tight and sharp pain and has no obvious triggers  nothing makes the pain feel worse, nothing makes the pain feel better. Pain is currently 6/10 and at worst was 8/10.  Pt saw cardiologist Dr. Debara Pickett approx 5 months ago for similar chest pain, but did not have same "contricting heart pain", at which time she had a normal stress test and was told she had had an MI on her echo even though pt never realized she had.  Pt has had same chest pain intermittently since then, but presented today because of the new "constricting heart pain."  Pt reports associated known frequent tachycardia for which she takes metoprolol however she ran out 2 weeks ago because of cost. Also has  nausea, mild dry cough, sweats, and chills.  Pt denies fever and usual leg swelling while she is on steroids.  Patient reports she is on steroids and CellCept for her ITP. She is s/p splenectomy. She states her platelet count last week was 389,000 however the month before it was 38,000.   PCP Dr Gerarda Fraction Hematologist is Dr. Mariam Dollar at [facility] Cardiologist is Dr. Debara Pickett   Past Medical History  Diagnosis  Date  . Essential hypertension, benign   . ITP (idiopathic thrombocytopenic purpura)   . Thrombocytopenia     Received Cytoxan PO. Last dose 12/11/11  . Depression   . Thrush, oral 05/29/2011  . Evan's syndrome   . Type 2 diabetes mellitus   . Pulmonary embolism     Past Surgical History  Procedure Date  . Cholecystectomy   . Bone marrow biopsy   . Splenectomy, total     Family History  Problem Relation Age of Onset  . Cancer Mother   . Cervical cancer Mother   . Cancer Father   . Lung cancer Father   . Pneumonia Brother     History  Substance Use Topics  . Smoking status: Former Smoker    Types: Cigarettes    Quit date: 02/15/2011  . Smokeless tobacco: Never Used  . Alcohol Use: No  Lives at home   No OB history provided.  Review of Systems  Cardiovascular: Positive for chest pain.  Gastrointestinal: Positive for nausea.  Neurological: Positive for headaches.  All other systems reviewed and are negative.    Allergies  Yellow jacket venom  Home Medications   Current Outpatient Rx  Name Route Sig Dispense Refill  . ACYCLOVIR 400 MG PO TABS Oral Take 400 mg by mouth every morning.     . ALBUTEROL SULFATE HFA 108 (90 BASE) MCG/ACT IN AERS Inhalation Inhale 2 puffs into the lungs every 4 (  four) hours as needed. For shortness of breath    . ALPRAZOLAM 0.5 MG PO TABS Oral Take 0.5-1 mg by mouth daily as needed. *May take one tablet by mouth during the day if/as needed    . CYCLOBENZAPRINE HCL 5 MG PO TABS Oral Take 1 tablet (5 mg total) by mouth 3 (three) times daily as needed for muscle spasms. 15 tablet 0  . ERYTHROMYCIN 5 MG/GM OP OINT Topical Apply 1 application topically at bedtime. Applies to infection on toe.    Marland Kitchen FLUCONAZOLE 100 MG PO TABS Oral Take 100 mg by mouth daily.    . INSULIN ASPART 100 UNIT/ML [place] SOLN Subcutaneous Inject into the skin 3 (three) times daily as needed. Sliding scale    . METOPROLOL SUCCINATE ER 25 MG PO TB24 Oral Take 25 mg by  mouth every morning.     Marland Kitchen MYCOPHENOLATE MOFETIL 250 MG PO CAPS Oral Take 250-500 mg by mouth 2 (two) times daily. Take two capsules in the morning and one capsule in the evening    . OXYCODONE-ACETAMINOPHEN 7.5-325 MG PO TABS Oral Take 1 tablet by mouth every 4 (four) hours as needed for pain. 20 tablet 0  . PREDNISONE 10 MG PO TABS Oral Take 10 mg by mouth daily.    . TRIAMTERENE-HCTZ 37.5-25 MG PO TABS Oral Take 2 tablets by mouth Daily.    . WARFARIN SODIUM 1 MG PO TABS Oral Take 4 mg by mouth every evening.      BP 134/100  Pulse 114  Temp 98 F (36.7 C) (Oral)  Ht 5\' 5"  (1.651 m)  Wt 206 lb (93.441 kg)  BMI 34.28 kg/m2  SpO2 97%  Vital signs normal    Physical Exam  Nursing note and vitals reviewed. Constitutional: She is oriented to person, place, and time. She appears well-developed and well-nourished.  Non-toxic appearance. She does not appear ill. No distress.  HENT:  Head: Normocephalic and atraumatic.  Right Ear: External ear normal.  Left Ear: External ear normal.  Nose: Nose normal. No mucosal edema or rhinorrhea.  Mouth/Throat: Mucous membranes are normal. No dental abscesses or uvula swelling.       Tongue is dry.  Eyes: Conjunctivae normal and EOM are normal. Pupils are equal, round, and reactive to light.  Neck: Normal range of motion and full passive range of motion without pain. Neck supple.  Cardiovascular: Regular rhythm and normal heart sounds.  Exam reveals no gallop and no friction rub.   No murmur heard.      Tachycardic.  Pulmonary/Chest: Effort normal and breath sounds normal. No respiratory distress. She has no wheezes. She has no rhonchi. She has no rales. She exhibits tenderness. She exhibits no crepitus.         Left upper chest wall tender to palpation. Mild costochondral tenderness bilaterally.  Abdominal: Soft. Normal appearance and bowel sounds are normal. She exhibits no distension. There is no tenderness. There is no rebound and no  guarding.  Musculoskeletal: Normal range of motion. She exhibits no edema and no tenderness.       Moves all extremities well.  No lower extremity edema.  Neurological: She is alert and oriented to person, place, and time. She has normal strength. No cranial nerve deficit.  Skin: Skin is warm, dry and intact. No rash noted. No erythema. No pallor.  Psychiatric: She has a normal mood and affect. Her speech is normal and behavior is normal. Her mood appears not anxious.  ED Course  Procedures (including critical care time)   Medications  morphine 4 MG/ML injection 4 mg (4 mg Intravenous Not Given 07/29/12 1306)  0.9 %  sodium chloride infusion (0  Intravenous Stopped 07/29/12 1427)  sodium chloride 0.9 % bolus 1,000 mL (1000 mL Intravenous New Bag/Given 07/29/12 1258)  cyclobenzaprine (FLEXERIL) tablet 10 mg (10 mg Oral Given 07/29/12 1305)  ondansetron (ZOFRAN) injection 4 mg (4 mg Intravenous Given 07/29/12 1304)  morphine 2 MG/ML injection (4 mg Intravenous Given 07/29/12 1305)  metoprolol tartrate (LOPRESSOR) tablet 25 mg (25 mg Oral Given 07/29/12 1429)  traMADol (ULTRAM) tablet 50 mg (50 mg Oral Given 07/29/12 1510)    DIAGNOSTIC STUDIES: Oxygen Saturation is 97% on room air, adequate by my interpretation.      COORDINATION OF CARE: 12:20- Patient informed of clinical course, understands medical decision-making process, and agrees with plan.  Pt states her pain is still a "4" given tramadol to go home. She was given metoprolol for her tachycardia.   Results for orders placed during the hospital encounter of 07/29/12  CBC WITH DIFFERENTIAL      Component Value Range   WBC 18.5 (*) 4.0 - 10.5 K/uL   RBC 5.43 (*) 3.87 - 5.11 MIL/uL   Hemoglobin 13.0  12.0 - 15.0 g/dL   HCT 41.2  36.0 - 46.0 %   MCV 75.9 (*) 78.0 - 100.0 fL   MCH 23.9 (*) 26.0 - 34.0 pg   MCHC 31.6  30.0 - 36.0 g/dL   RDW 18.2 (*) 11.5 - 15.5 %   Platelets 248  150 - 400 K/uL   Neutrophils Relative 95 (*) 43 -  77 %   Lymphocytes Relative 3 (*) 12 - 46 %   Monocytes Relative 2 (*) 3 - 12 %   Eosinophils Relative 0  0 - 5 %   Basophils Relative 0  0 - 1 %   Neutro Abs 17.5 (*) 1.7 - 7.7 K/uL   Lymphs Abs 0.6 (*) 0.7 - 4.0 K/uL   Monocytes Absolute 0.4  0.1 - 1.0 K/uL   Eosinophils Absolute 0.0  0.0 - 0.7 K/uL   Basophils Absolute 0.0  0.0 - 0.1 K/uL   Smear Review LARGE PLATELETS PRESENT    BASIC METABOLIC PANEL      Component Value Range   Sodium 136  135 - 145 mEq/L   Potassium 3.5  3.5 - 5.1 mEq/L   Chloride 99  96 - 112 mEq/L   CO2 21  19 - 32 mEq/L   Glucose, Bld 172 (*) 70 - 99 mg/dL   BUN 29 (*) 6 - 23 mg/dL   Creatinine, Ser 1.31 (*) 0.50 - 1.10 mg/dL   Calcium 9.7  8.4 - 10.5 mg/dL   GFR calc non Af Amer 48 (*) >90 mL/min   GFR calc Af Amer 56 (*) >90 mL/min  PROTIME-INR      Component Value Range   Prothrombin Time 15.6 (*) 11.6 - 15.2 seconds   INR 1.27  0.00 - 1.49  TROPONIN I      Component Value Range   Troponin I <0.30  <0.30 ng/mL  D-DIMER, QUANTITATIVE      Component Value Range   D-Dimer, Quant <0.27  0.00 - 0.48 ug/mL-FEU  TROPONIN I      Component Value Range   Troponin I <0.30  <0.30 ng/mL     Laboratory interpretation all normal except subtherapeutic coumadin level, stable renal insufficiency, new leukocytosis on steroids  Dg Chest 2 View  07/29/2012  *RADIOLOGY REPORT*  Clinical Data: 46 year old female chest pain.  Pulmonary emboli in August.  CHEST - 2 VIEW  Comparison: 05/02/2012 and earlier. Lumbar radiographs 07/04/2012.  Findings: Lung volumes are stable within normal limits.  Cardiac size and mediastinal contours are within normal limits.  Visualized tracheal air column is within normal limits.  No pneumothorax, pulmonary edema, pleural effusion or acute pulmonary opacity. Stable right upper quadrant surgical clips. Mild T12 compression deformities stable since 07/04/2012.  IMPRESSION: No acute cardiopulmonary abnormality.   Original Report  Authenticated By: Roselyn Reef, M.D.     Date: 07/29/2012  Rate: 128  Rhythm: sinus tachycardia and premature atrial contractions (PAC)  QRS Axis: left  Intervals: normal  ST/T Wave abnormalities: normal  Conduction Disutrbances:none  Narrative Interpretation: Q waves anteriorly  Old EKG Reviewed: unchanged from 07/04/2012    1. Chest pain, atypical   2. Chest wall pain   3. Tachycardia     New Prescriptions   CYCLOBENZAPRINE (FLEXERIL) 10 MG TABLET    Take 1 tablet (10 mg total) by mouth 3 (three) times daily as needed for muscle spasms.   METOPROLOL (LOPRESSOR) 50 MG TABLET    Take 1 tablet (50 mg total) by mouth 2 (two) times daily.   TRAMADOL-ACETAMINOPHEN (ULTRACET) 37.5-325 MG PER TABLET    2 tabs po QID prn pain    Plan discharge  Rolland Porter, MD, FACEP   MDM   I personally performed the services described in this documentation, which was scribed in my presence. The recorded information has been reviewed and considered.  Rolland Porter, MD, Abram Sander        Summer Norrie, MD 07/29/12 [phone]

## 2012-07-29 NOTE — ED Notes (Signed)
Pt reports woke up around 0530 this am with tightness in chest.  Says took 2 alkaseltzers without relief.  Reports had blood clot in r lung  In September.  Pt presently on coumadin.  Reports was nauseated this morning but not now.  2 days ago had some episodes of vomiting.  Pt says pain is in center to left chest and left feels "tingly."

## 2012-09-08 ENCOUNTER — Emergency Department (HOSPITAL_COMMUNITY)
Admission: EM | Admit: 2012-09-08 | Discharge: 2012-09-08 | Disposition: A | Discharge status: Home / Self Care | Payer: Self-pay | Attending: Emergency Medicine | Admitting: Emergency Medicine

## 2012-09-08 ENCOUNTER — Emergency Department (HOSPITAL_COMMUNITY): Payer: Self-pay

## 2012-09-08 ENCOUNTER — Encounter (HOSPITAL_COMMUNITY): Payer: Self-pay | Admitting: *Deleted

## 2012-09-08 DIAGNOSIS — R059 Cough, unspecified: Secondary | ICD-10-CM | POA: Insufficient documentation

## 2012-09-08 DIAGNOSIS — F329 Major depressive disorder, single episode, unspecified: Secondary | ICD-10-CM | POA: Insufficient documentation

## 2012-09-08 DIAGNOSIS — F3289 Other specified depressive episodes: Secondary | ICD-10-CM | POA: Insufficient documentation

## 2012-09-08 DIAGNOSIS — D693 Immune thrombocytopenic purpura: Secondary | ICD-10-CM | POA: Insufficient documentation

## 2012-09-08 DIAGNOSIS — Z794 Long term (current) use of insulin: Secondary | ICD-10-CM | POA: Insufficient documentation

## 2012-09-08 DIAGNOSIS — I2699 Other pulmonary embolism without acute cor pulmonale: Secondary | ICD-10-CM | POA: Insufficient documentation

## 2012-09-08 DIAGNOSIS — Z87891 Personal history of nicotine dependence: Secondary | ICD-10-CM | POA: Insufficient documentation

## 2012-09-08 DIAGNOSIS — E119 Type 2 diabetes mellitus without complications: Secondary | ICD-10-CM | POA: Insufficient documentation

## 2012-09-08 DIAGNOSIS — I10 Essential (primary) hypertension: Secondary | ICD-10-CM | POA: Insufficient documentation

## 2012-09-08 DIAGNOSIS — Z79899 Other long term (current) drug therapy: Secondary | ICD-10-CM | POA: Insufficient documentation

## 2012-09-08 DIAGNOSIS — Z7901 Long term (current) use of anticoagulants: Secondary | ICD-10-CM | POA: Insufficient documentation

## 2012-09-08 DIAGNOSIS — R05 Cough: Secondary | ICD-10-CM | POA: Insufficient documentation

## 2012-09-08 LAB — BASIC METABOLIC PANEL
CO2: 28 mEq/L (ref 19–32)
Chloride: 101 mEq/L (ref 96–112)
GFR calc non Af Amer: 51 mL/min — ABNORMAL LOW (ref >90)
Glucose, Bld: 83 mg/dL (ref 70–99)
Potassium: 3.6 mEq/L (ref 3.5–5.1)
Sodium: 139 mEq/L (ref 135–145)

## 2012-09-08 LAB — CBC
Hemoglobin: 12.6 g/dL (ref 12.0–15.0)
MCHC: 31.3 g/dL (ref 30.0–36.0)
RBC: 5.32 MIL/uL — ABNORMAL HIGH (ref 3.87–5.11)
WBC: 9.2 10*3/uL (ref 4.0–10.5)

## 2012-09-08 LAB — PROTIME-INR
INR: 1.02 (ref 0.00–1.49)
Prothrombin Time: 13.3 seconds (ref 11.6–15.2)

## 2012-09-08 MED ORDER — TRIAMTERENE-HCTZ 37.5-25 MG PO TABS: 1.0000 | ORAL_TABLET | Freq: Every day | ORAL | Status: DC

## 2012-09-08 MED ORDER — HYDROMORPHONE HCL PF 1 MG/ML IJ SOLN
1.0000 mg | Freq: Once | INTRAMUSCULAR | Status: AC
  Administered 2012-09-08: 1 mg via INTRAVENOUS
  Filled 2012-09-08: qty 1

## 2012-09-08 MED ORDER — METHYLPREDNISOLONE SODIUM SUCC 125 MG IJ SOLR
125.0000 mg | Freq: Once | INTRAMUSCULAR | Status: AC
  Administered 2012-09-08: 125 mg via INTRAVENOUS
  Filled 2012-09-08: qty 2

## 2012-09-08 MED ORDER — WARFARIN SODIUM 5 MG PO TABS: 5.0000 mg | ORAL_TABLET | Freq: Every day | ORAL | Status: DC

## 2012-09-08 MED ORDER — FUROSEMIDE 40 MG PO TABS
20.0000 mg | ORAL_TABLET | Freq: Once | ORAL | Status: AC
  Administered 2012-09-08: 20 mg via ORAL
  Filled 2012-09-08: qty 2

## 2012-09-08 MED ORDER — METOPROLOL TARTRATE 100 MG PO TABS: 50.0000 mg | ORAL_TABLET | Freq: Two times a day (BID) | ORAL | Status: DC

## 2012-09-08 MED ORDER — FLUOXETINE HCL 20 MG PO TABS: 20.0000 mg | ORAL_TABLET | Freq: Every day | ORAL | Status: DC

## 2012-09-08 MED ORDER — ACYCLOVIR 400 MG PO TABS: 400.0000 mg | ORAL_TABLET | ORAL | Status: DC

## 2012-09-08 MED ORDER — ONDANSETRON HCL 4 MG/2ML IJ SOLN
4.0000 mg | Freq: Once | INTRAMUSCULAR | Status: AC
  Administered 2012-09-08: 4 mg via INTRAVENOUS
  Filled 2012-09-08: qty 2

## 2012-09-08 NOTE — ED Provider Notes (Signed)
History    This chart was scribed for Summer Diego, MD, MD by Summer Cook, ED Scribe. The patient was seen in room APA02 and the patient's care was started at 10:45AM.   CSN: [id number]  Arrival date & time 09/08/12  1016     Chief Complaint  Patient presents with  . Chest Pain    (Consider location/radiation/quality/duration/timing/severity/associated sxs/prior treatment) Patient is a 46 y.o. female presenting with chest pain. The history is provided by the patient. No language interpreter was used.  Chest Pain The chest pain began yesterday. Chest pain occurs constantly. The chest pain is unchanged. The severity of the pain is moderate. The pain does not radiate. Primary symptoms include cough. Pertinent negatives for primary symptoms include no fever, no fatigue and no abdominal pain. She tried nothing for the symptoms.  Pertinent negatives for past medical history include no seizures.    Summer Cook is a 46 y.o. female with hx of ITP, DM, PE and who presents to the Emergency Department complaining of constant, moderate central chest pain onset 1 day ago. Pt reports having generalized soreness and mild non productive cough. Denies fevers, chills, sore throat and any other pain. She has not taken her coumadin (last taken 2 weeks ago) or any other medication (cellcept 1x/day, acyclovir 1x/day, metoprolol 1x/day, prozac 1x/day, prednisone, hydrocodone) due to insurance complications. She is unsure of her INR.   Past Medical History  Diagnosis Date  . Essential hypertension, benign   . ITP (idiopathic thrombocytopenic purpura)   . Thrombocytopenia     Received Cytoxan PO. Last dose 12/11/11  . Depression   . Thrush, oral 05/29/2011  . Evan's syndrome   . Type 2 diabetes mellitus   . Pulmonary embolism     Past Surgical History  Procedure Date  . Cholecystectomy   . Bone marrow biopsy   . Splenectomy, total     Family History  Problem Relation Age of Onset  . Cancer  Mother   . Cervical cancer Mother   . Cancer Father   . Lung cancer Father   . Pneumonia Brother     History  Substance Use Topics  . Smoking status: Former Smoker    Types: Cigarettes    Quit date: 02/15/2011  . Smokeless tobacco: Never Used  . Alcohol Use: No    OB History    Grav Para Term Preterm Abortions TAB SAB Ect Mult Living                  Review of Systems  Constitutional: Negative for fever and fatigue.  HENT: Negative for congestion, sinus pressure and ear discharge.   Eyes: Negative for discharge.  Respiratory: Positive for cough.   Cardiovascular: Positive for chest pain.  Gastrointestinal: Negative for abdominal pain and diarrhea.  Genitourinary: Negative for frequency and hematuria.  Musculoskeletal: Negative for back pain.  Skin: Negative for rash.  Neurological: Negative for seizures and headaches.  Hematological: Negative.   Psychiatric/Behavioral: Negative for hallucinations.  All other systems reviewed and are negative.    Allergies  Yellow jacket venom  Home Medications   Current Outpatient Rx  Name  Route  Sig  Dispense  Refill  . ACYCLOVIR 400 MG PO TABS   Oral   Take 400 mg by mouth every morning.          . ALBUTEROL SULFATE HFA 108 (90 BASE) MCG/ACT IN AERS   Inhalation   Inhale 2 puffs into the lungs every 4 (  four) hours as needed. For shortness of breath         . ALPRAZOLAM 0.5 MG PO TABS   Oral   Take 0.5-1 mg by mouth daily as needed. *May take one tablet by mouth during the day if/as needed         . CYCLOBENZAPRINE HCL 10 MG PO TABS   Oral   Take 1 tablet (10 mg total) by mouth 3 (three) times daily as needed for muscle spasms.   30 tablet   0   . CYCLOBENZAPRINE HCL 5 MG PO TABS   Oral   Take 1 tablet (5 mg total) by mouth 3 (three) times daily as needed for muscle spasms.   15 tablet   0   . ERYTHROMYCIN 5 MG/GM OP OINT   Topical   Apply 1 application topically at bedtime. Applies to infection on  toe.         Marland Kitchen FLUCONAZOLE 100 MG PO TABS   Oral   Take 100 mg by mouth daily.         Marland Kitchen FLUOXETINE HCL 20 MG PO CAPS   Oral   Take 20 mg by mouth daily.         . INSULIN ASPART 100 UNIT/ML [place] SOLN   Subcutaneous   Inject into the skin 3 (three) times daily as needed. Sliding scale         . METOPROLOL TARTRATE 50 MG PO TABS   Oral   Take 1 tablet (50 mg total) by mouth 2 (two) times daily.   60 tablet   0   . METOPROLOL SUCCINATE ER 25 MG PO TB24   Oral   Take 25 mg by mouth every morning.          Marland Kitchen MYCOPHENOLATE MOFETIL 250 MG PO CAPS   Oral   Take 250-500 mg by mouth 2 (two) times daily. Take two capsules in the morning and one capsule in the evening         . PREDNISONE 10 MG PO TABS   Oral   Take 10 mg by mouth daily.         . TRAMADOL-ACETAMINOPHEN 37.5-325 MG PO TABS      2 tabs po QID prn pain   16 tablet   0   . TRIAMTERENE-HCTZ 37.5-25 MG PO TABS   Oral   Take 2 tablets by mouth Daily.         . WARFARIN SODIUM 5 MG PO TABS   Oral   Take 4 mg by mouth daily. Patient is splitting the 5mg  to make 4mg  daily           BP 148/113  Temp 97.9 F (36.6 C) (Oral)  Resp 20  Ht 5\' 5"  (1.651 m)  Wt 210 lb (95.255 kg)  BMI 34.95 kg/m2  SpO2 96%  Physical Exam  Nursing note and vitals reviewed. Constitutional: She is oriented to person, place, and time. She appears well-developed.  HENT:  Head: Normocephalic and atraumatic.  Eyes: Conjunctivae normal and EOM are normal. No scleral icterus.  Neck: Neck supple. No thyromegaly present.  Cardiovascular: Normal rate and regular rhythm.  Exam reveals no gallop and no friction rub.   No murmur heard. Pulmonary/Chest: No stridor. She has no wheezes. She has no rales. She exhibits no tenderness.  Abdominal: She exhibits no distension. There is no tenderness. There is no rebound.  Musculoskeletal: Normal range of motion. She exhibits tenderness (mildly diffuse ). She  exhibits no edema.   Lymphadenopathy:    She has no cervical adenopathy.  Neurological: She is oriented to person, place, and time. Coordination normal.  Skin: No rash noted. No erythema.  Psychiatric: She has a normal mood and affect. Her behavior is normal.    ED Course  Procedures (including critical care time) DIAGNOSTIC STUDIES: Oxygen Saturation is 96% on room air, adequate by my interpretation.    COORDINATION OF CARE: 10:50 AM Discussed ED treatment with pt     Labs Reviewed  CBC - Abnormal; Notable for the following:    RBC 5.32 (*)     MCV 75.8 (*)     MCH 23.7 (*)     RDW 18.3 (*)     All other components within normal limits  BASIC METABOLIC PANEL - Abnormal; Notable for the following:    BUN 25 (*)     Creatinine, Ser 1.24 (*)     GFR calc non Af Amer 51 (*)     GFR calc Af Amer 59 (*)     All other components within normal limits  TROPONIN I  PROTIME-INR   Dg Chest Port 1 View  09/08/2012  *RADIOLOGY REPORT*  Clinical Data: Chest pain  PORTABLE CHEST - 1 VIEW  Comparison: Chest x-ray of 07/29/2012  Findings: No active infiltrate or effusion is seen.  There is mild cardiomegaly present.  No bony abnormality is seen.  IMPRESSION: Stable cardiomegaly.  No active lung disease.   Original Report Authenticated By: Ivar Drape, M.D.      No diagnosis found.    Date: 09/08/2012  Rate: 114  Rhythm:sinus tach  QRS Axis: left  Intervals: normal  ST/T Wave abnormalities: nonspecific ST changes  Conduction Disutrbances:none  Narrative Interpretation:   Old EKG Reviewed: none available   MDM        The chart was scribed for me under my direct supervision.  I personally performed the history, physical, and medical decision making and all procedures in the evaluation of this patient.Summer Diego, MD 09/08/12 1324

## 2012-09-08 NOTE — ED Notes (Signed)
Central cp with sob and dizziness that started last night.  Also c/o generalized "soreness."

## 2012-10-05 IMAGING — CR DG PELVIS 1-2V
1 series · 1 of 1 positions shown · non-contrast
Comparison: None

CLINICAL DATA: Pain

PELVIS - 1-2 VIEW

[view not recorded]
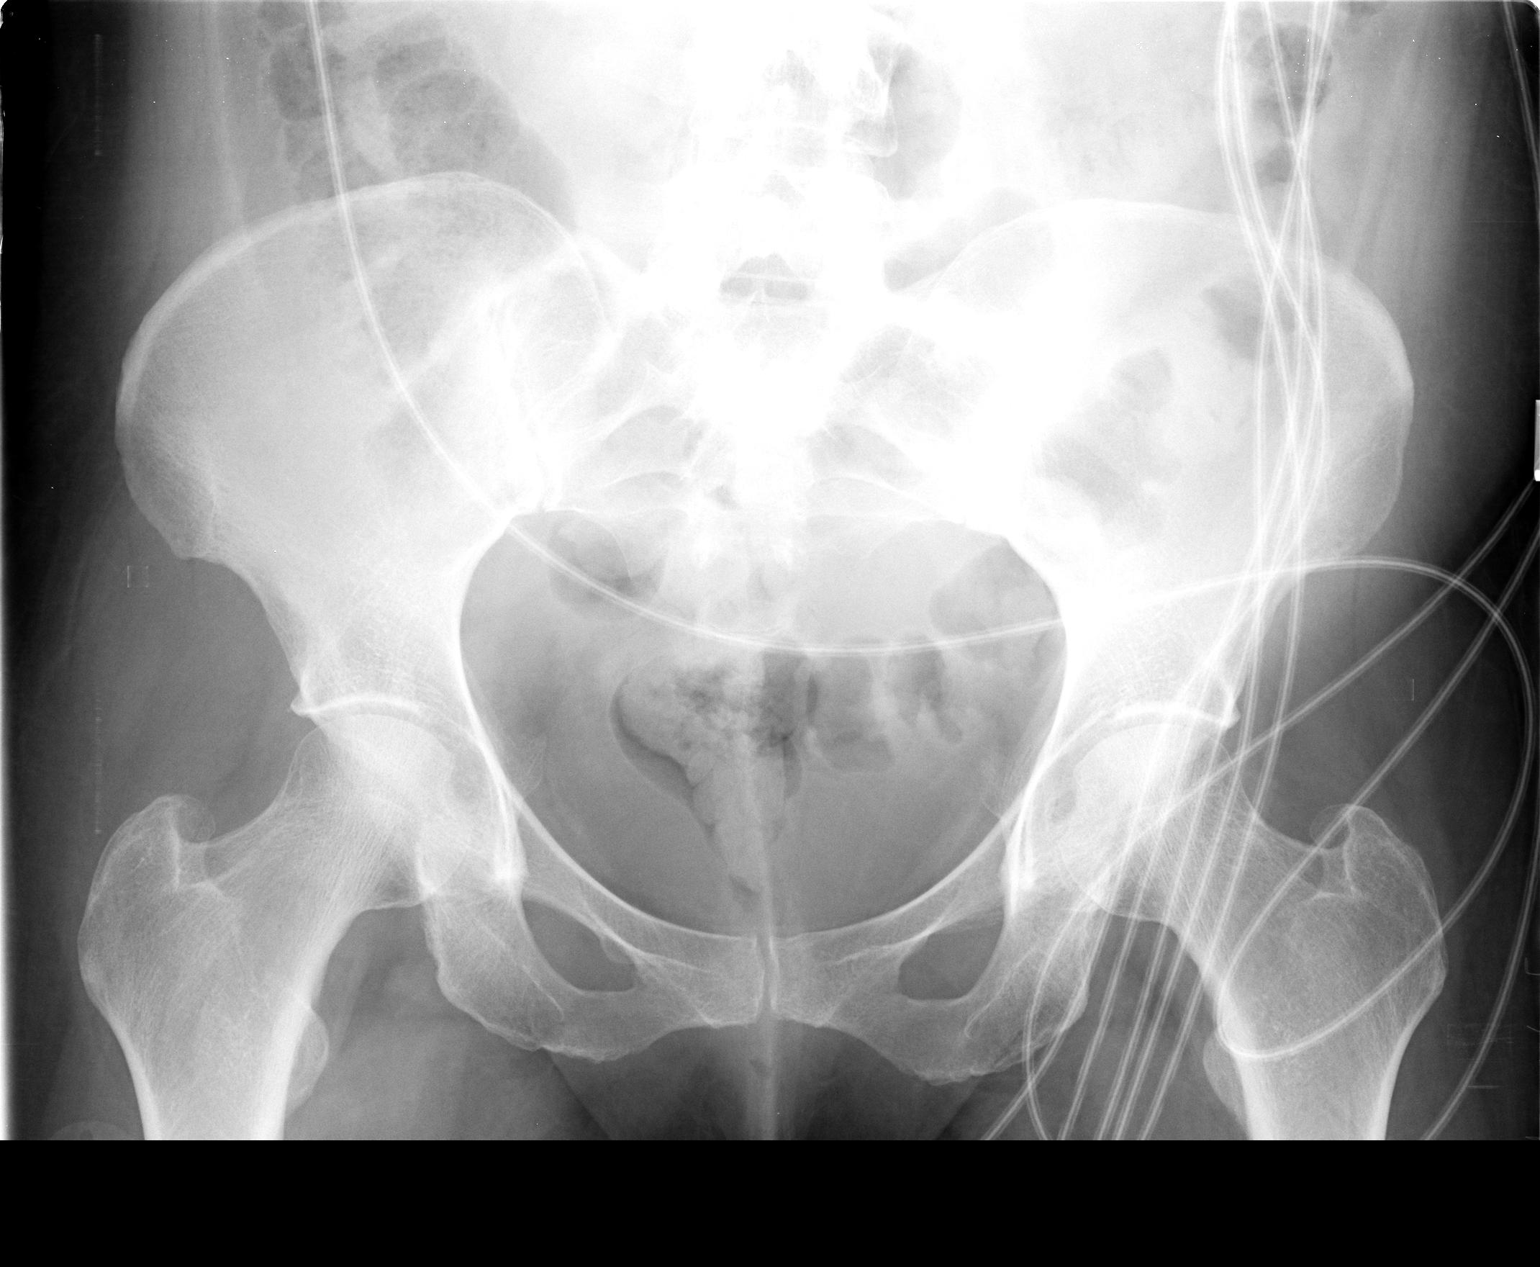

[1 of 1 positions shown; findings below may reference images not displayed]

FINDINGS: Symmetric hip and SI joints.
No acute fracture, dislocation or bone destruction.
No soft tissue calcifications identified.
Cardiac monitoring lines project over abdomen and left hip.
IMPRESSION: Normal exam.

## 2012-10-05 IMAGING — CR DG LUMBAR SPINE COMPLETE 4+V
5 series · 5 of 5 positions shown · non-contrast
Comparison: None.

CLINICAL DATA: Pain

LUMBAR SPINE - COMPLETE 4+ VIEW

[view not recorded (1 of 5)]
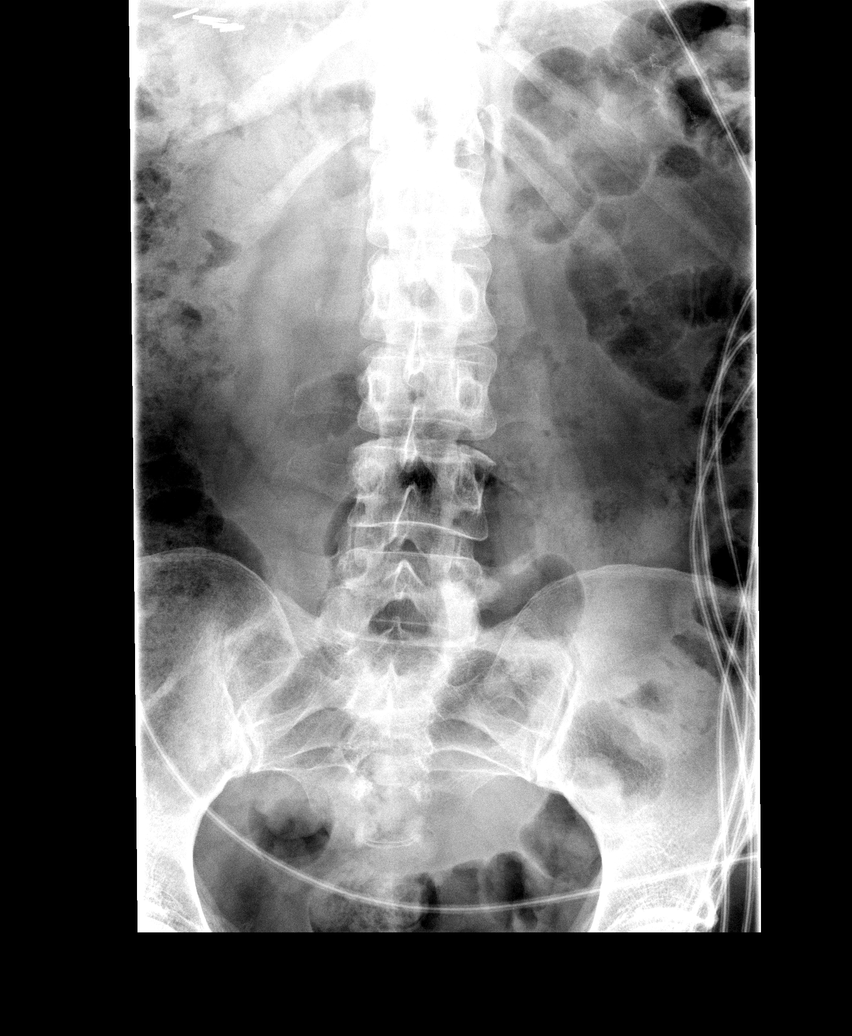

[view not recorded (2 of 5)]
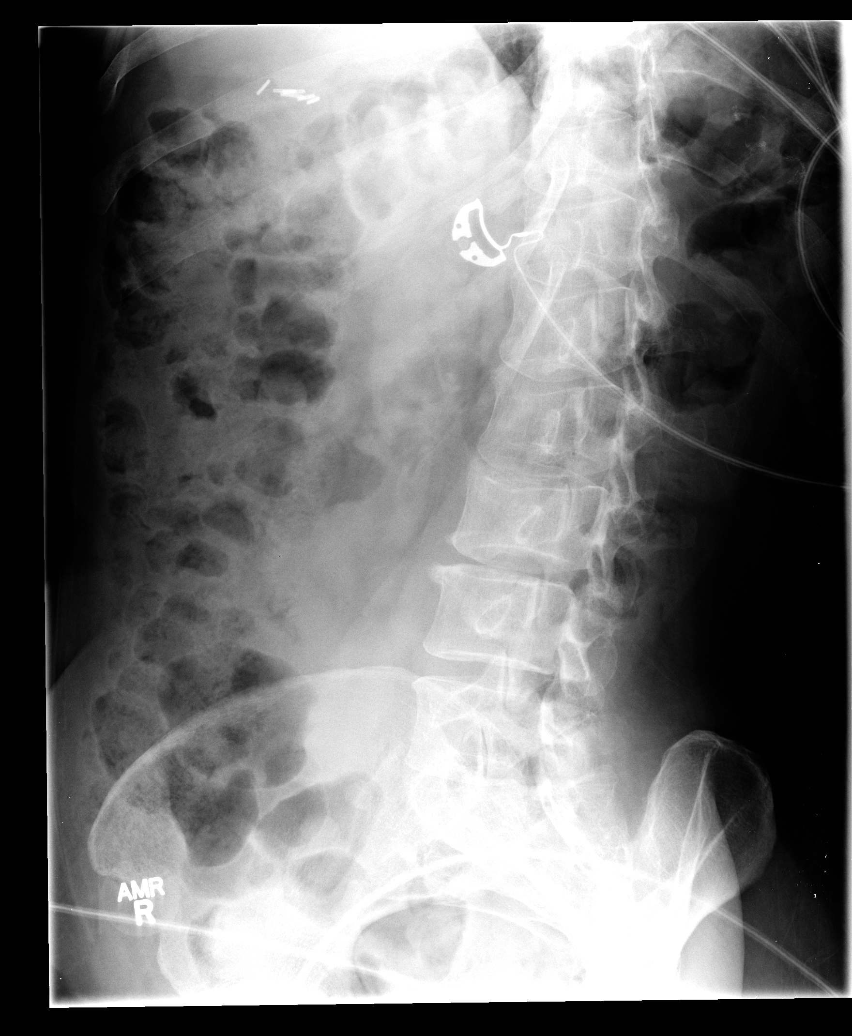

[view not recorded (3 of 5)]
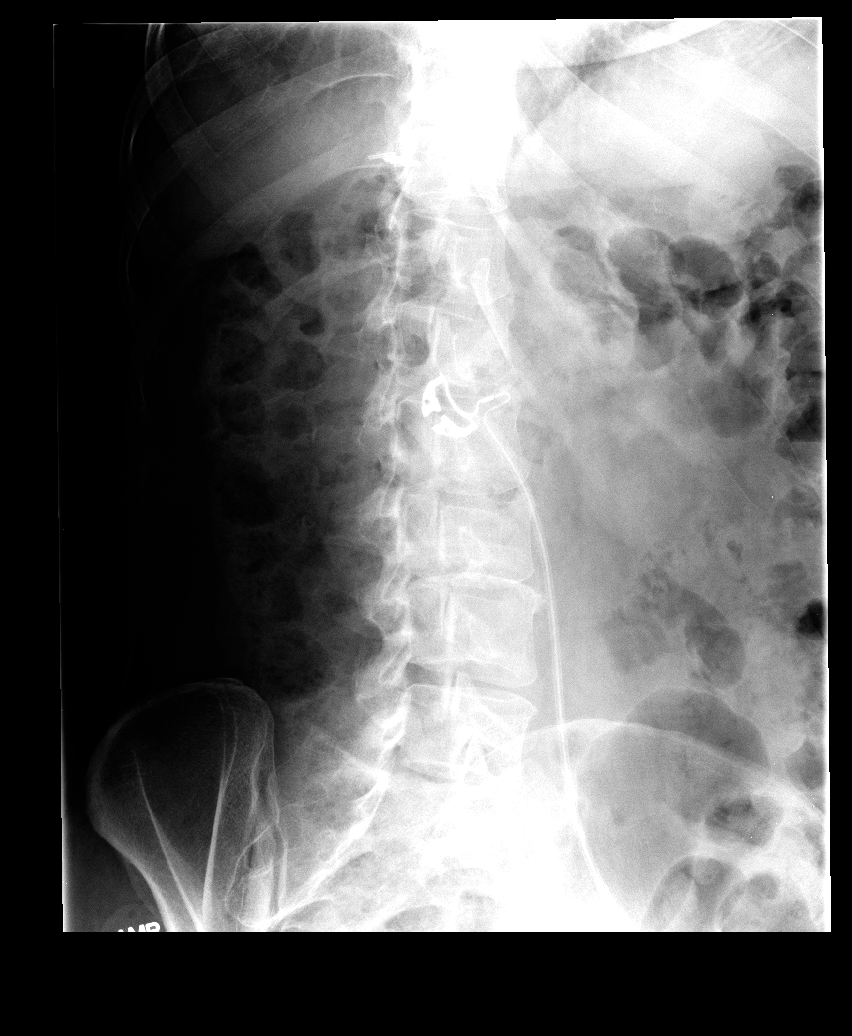

[view not recorded (4 of 5)]
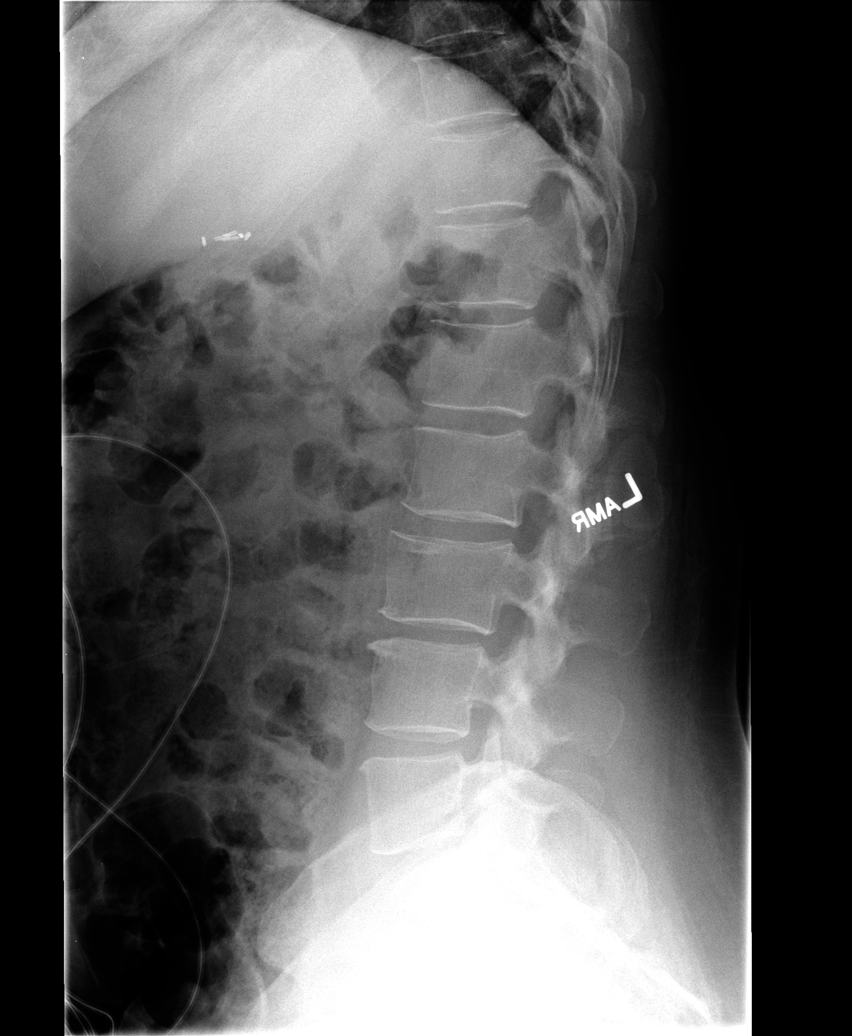

[view not recorded (5 of 5)]
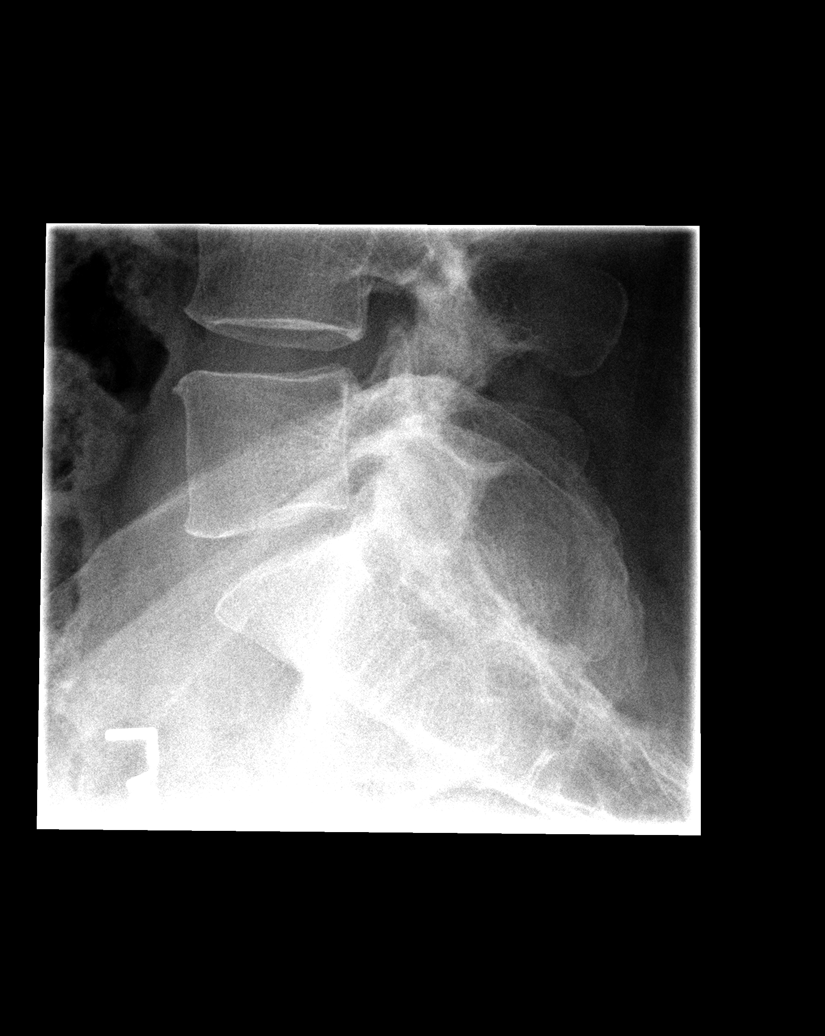

[5 of 5 positions shown; findings below may reference images not displayed]

FINDINGS: Five non-rib bearing lumbar vertebrae.
Osseous demineralization.
Minimal disc space narrowing at L3-L4.
Vertebral body and disc space heights otherwise maintained.
No acute fracture, subluxation or bone destruction.
No spondylolysis.
SI joints symmetric.
IMPRESSION: Osseous demineralization.
Minimal degenerative disc disease changes L3-L4.

## 2012-10-05 IMAGING — CR DG CHEST 2V
2 series · 2 of 2 positions shown · non-contrast
Comparison: 02/02/2011

CLINICAL DATA: Chest pain, shortness of breath

CHEST - 2 VIEW

[view not recorded (1 of 2)]
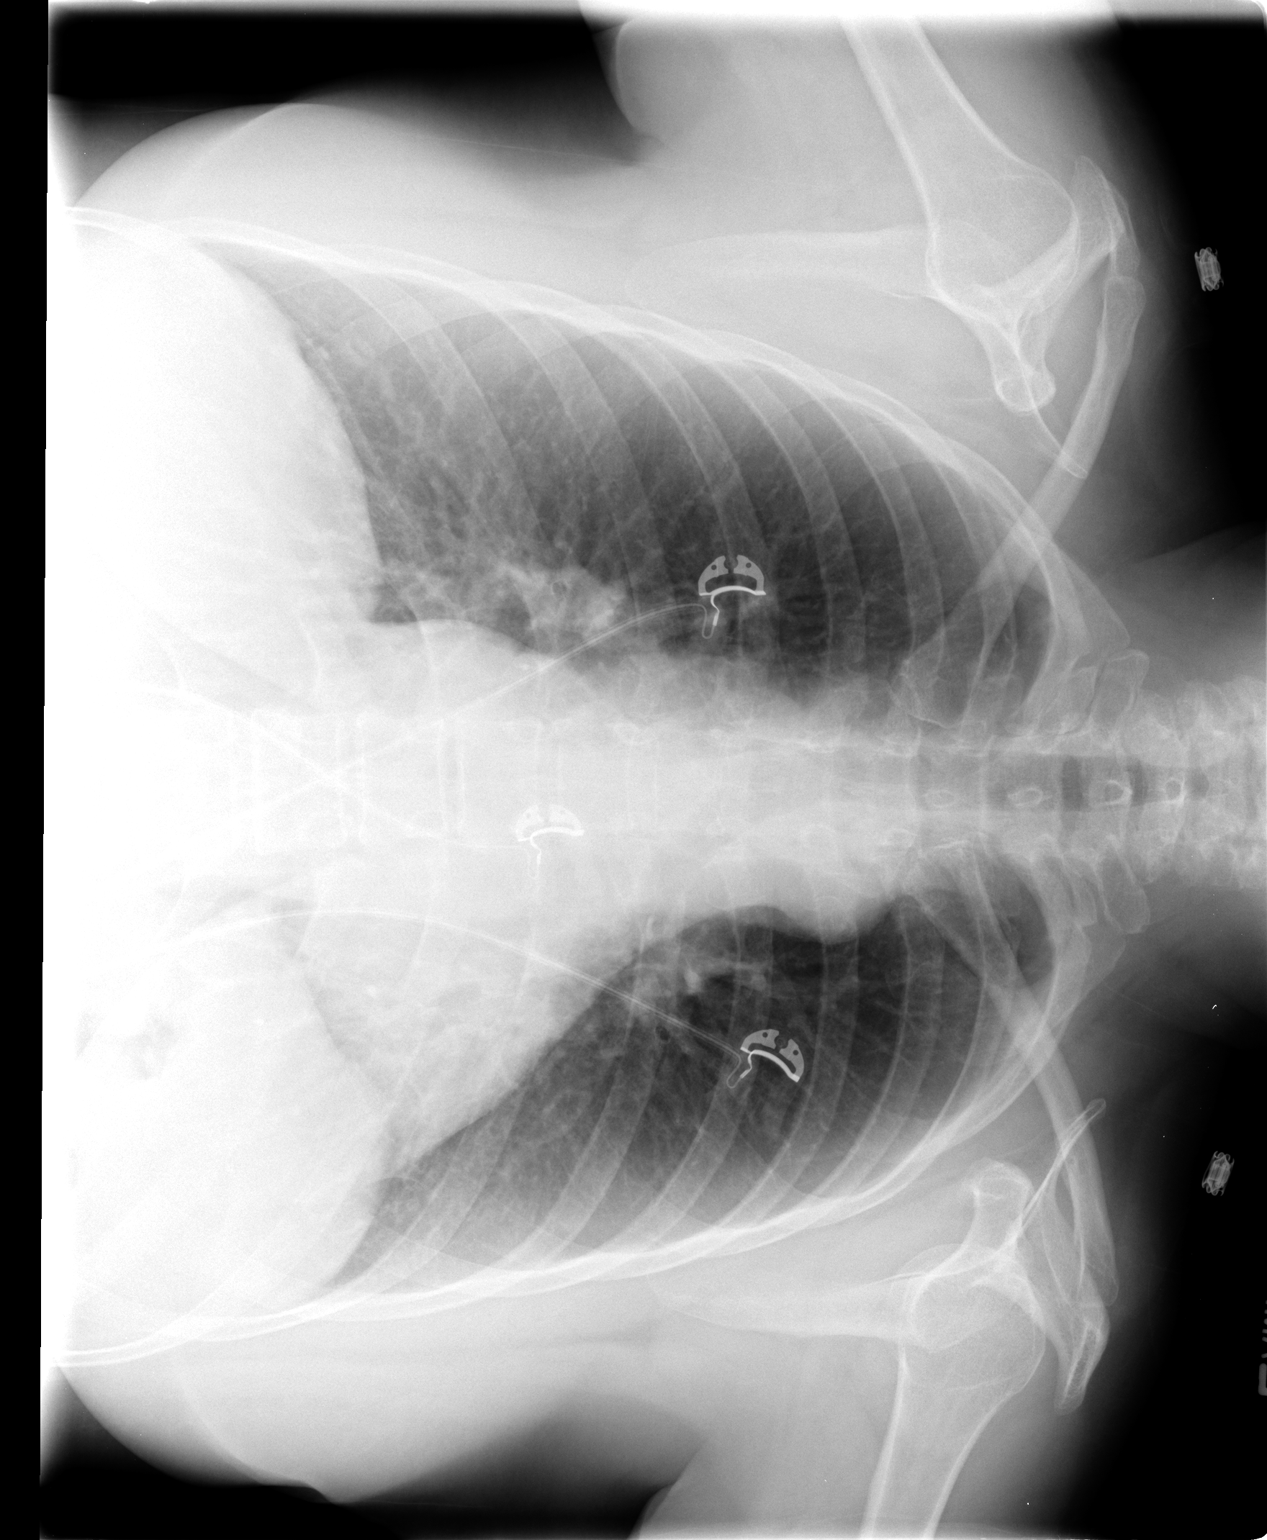

[view not recorded (2 of 2)]
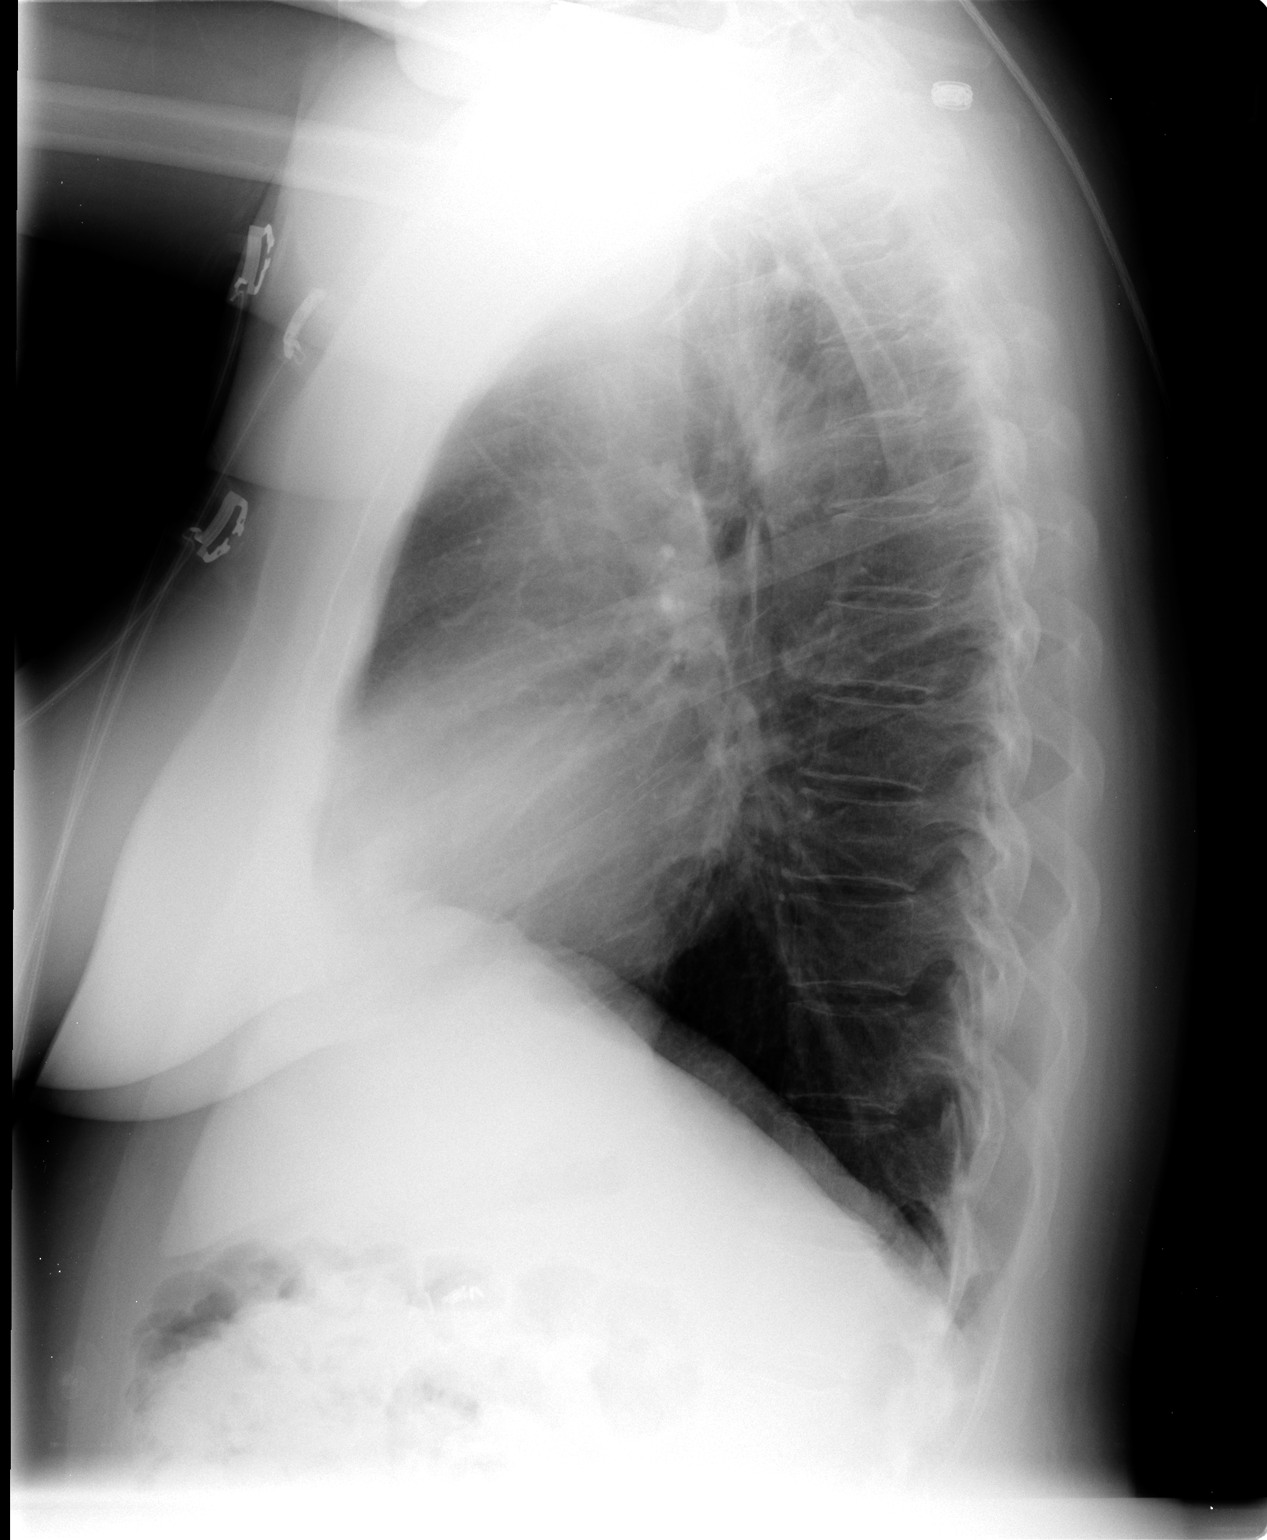

[2 of 2 positions shown; findings below may reference images not displayed]

FINDINGS: Upper-normal size of cardiac silhouette.
Mediastinal contours and pulmonary vascularity normal.
Minimal peribronchial thickening.
Lungs otherwise clear.
No pleural effusion or pneumothorax.
No acute osseous findings.
IMPRESSION: Minimal bronchitic changes without acute infiltrate.

## 2012-10-13 IMAGING — US US CHEST/MEDIASTINUM
1 series · 6 of 6 positions shown · non-contrast
Comparison: 04/30/2011.

CLINICAL DATA: Lymphadenopathy.  Left axillary swelling.

CHEST ULTRASOUND

[Series 1: us chest/mediastinum · 0.07mm/px · 6 of 6 slices shown]
[im 1/6]
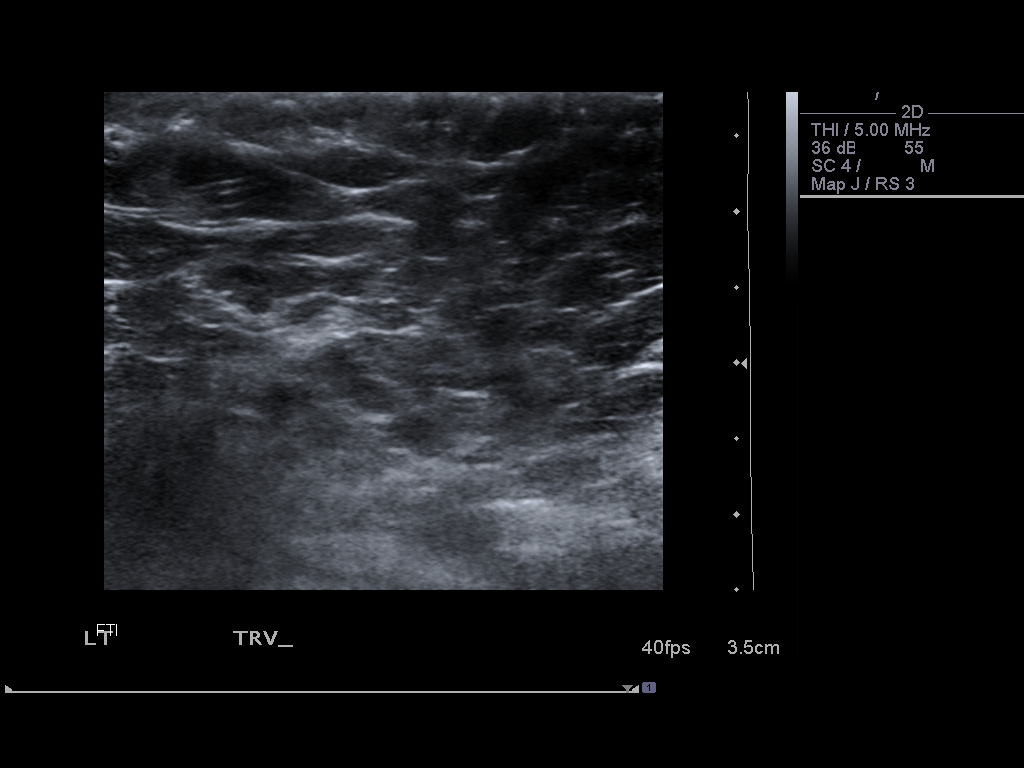
[im 2/6]
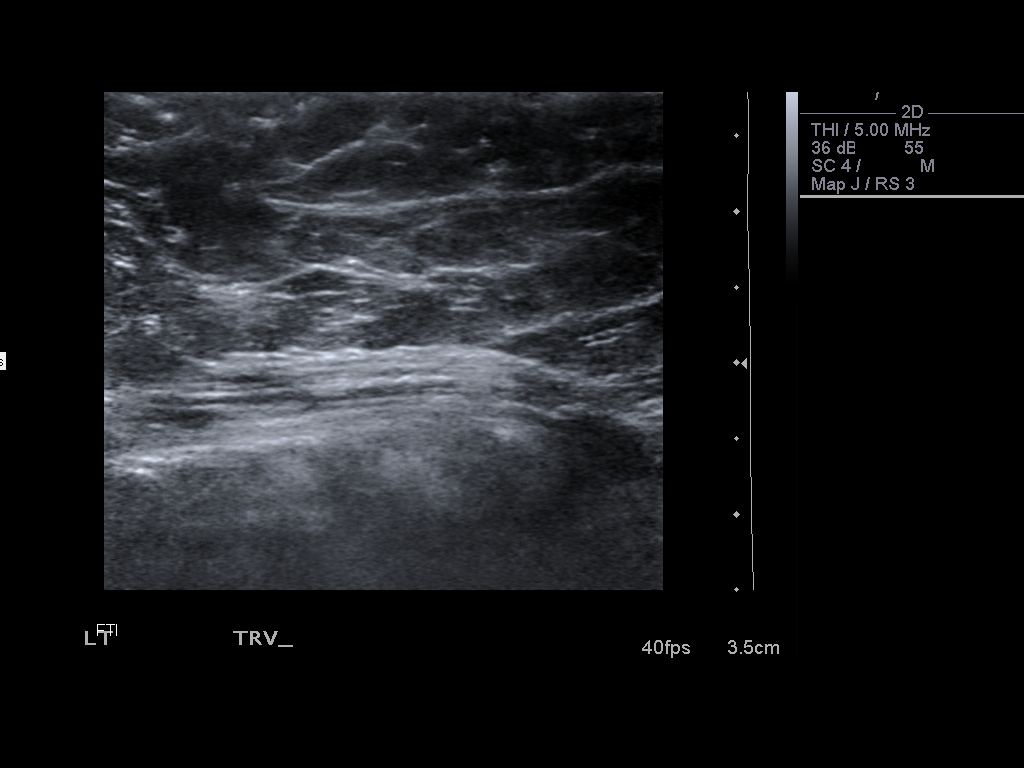
[im 3/6]
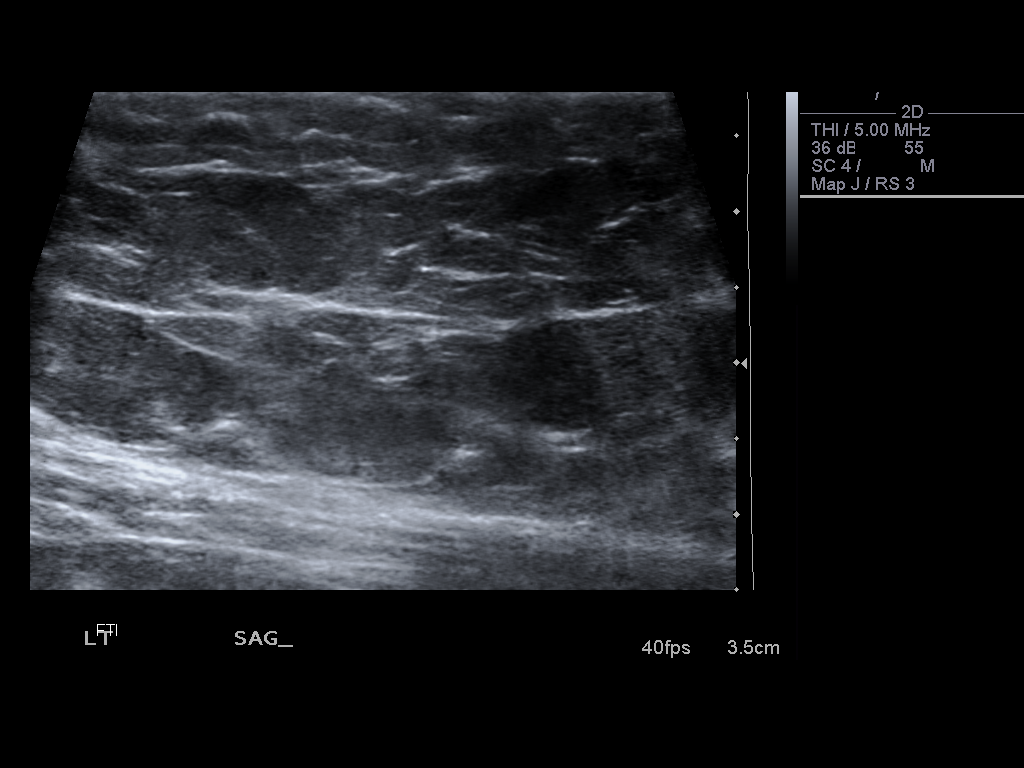
[im 4/6]
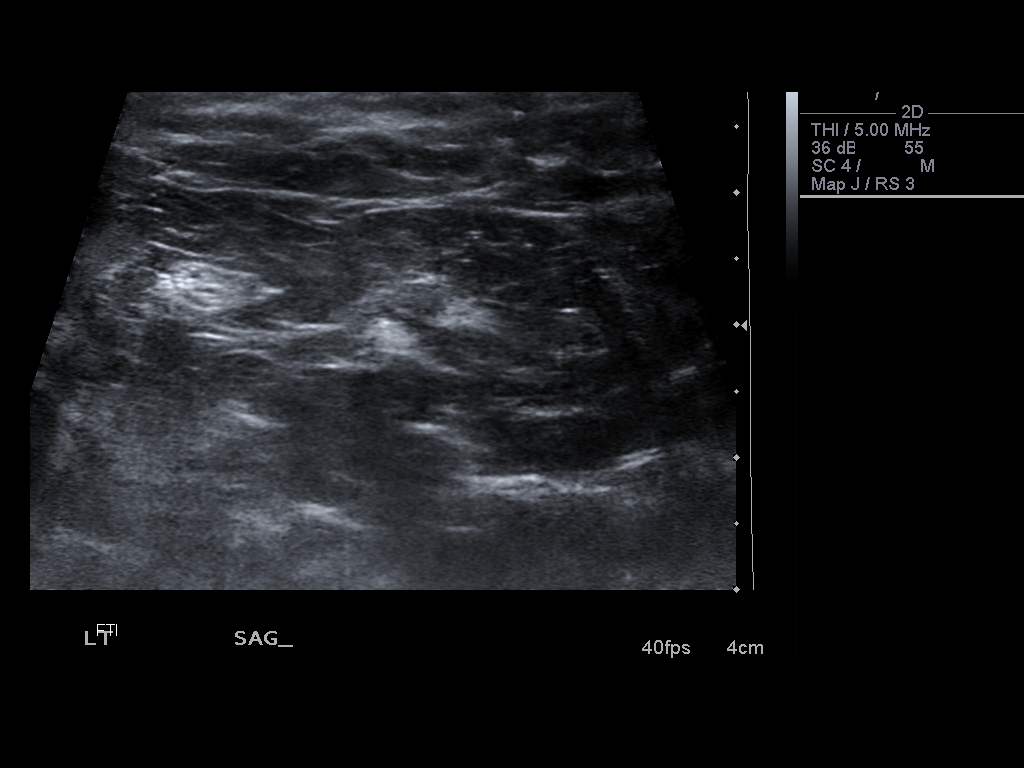
[im 5/6]
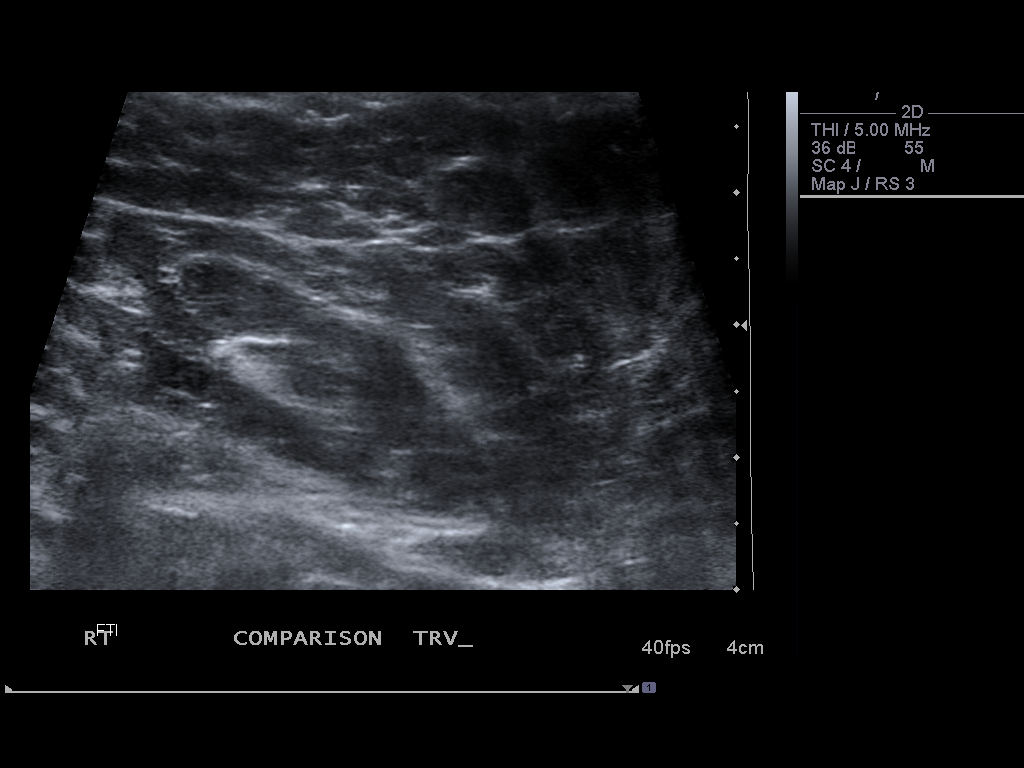
[im 6/6]
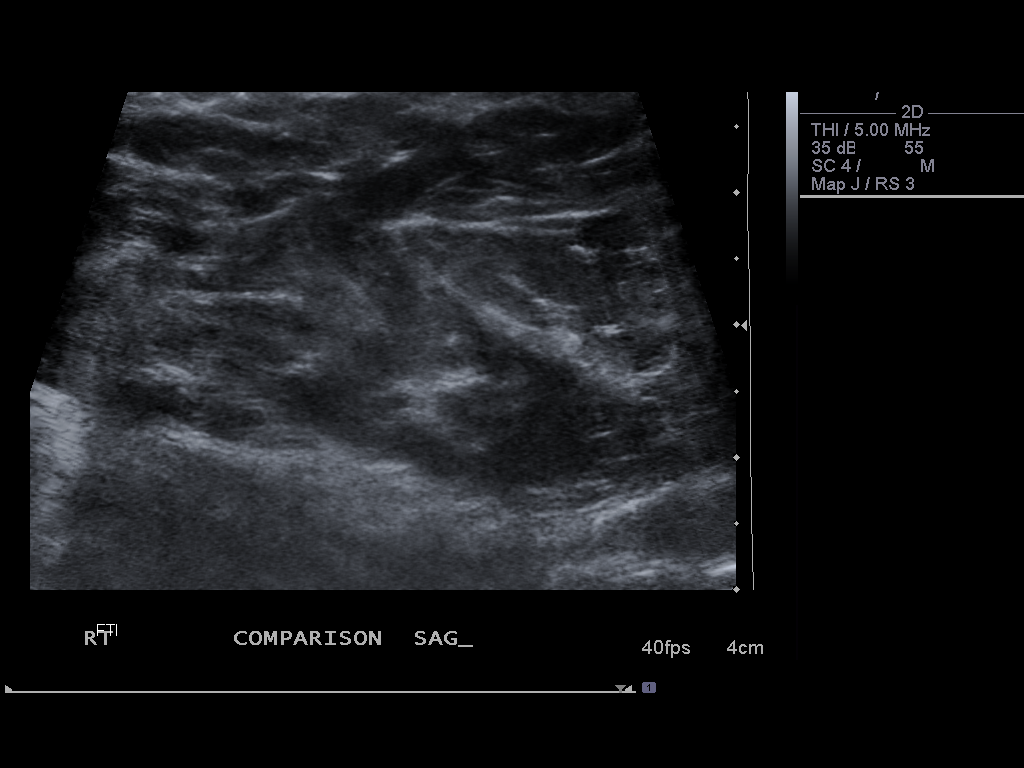

[6 of 6 positions shown; findings below may reference images not displayed]

FINDINGS: No mass or lymphadenopathy is present.  Normal
subcutaneous fat is present in the left axilla.
IMPRESSION: Negative ultrasound of the left axilla.

## 2012-11-09 IMAGING — CR DG CHEST 1V PORT
1 series · 1 of 1 positions shown · non-contrast
Comparison: 04/30/2011 and earlier.

CLINICAL DATA: 45-year-old female with fever, lethargy,
tachycardia.

PORTABLE CHEST - 1 VIEW

[view not recorded]
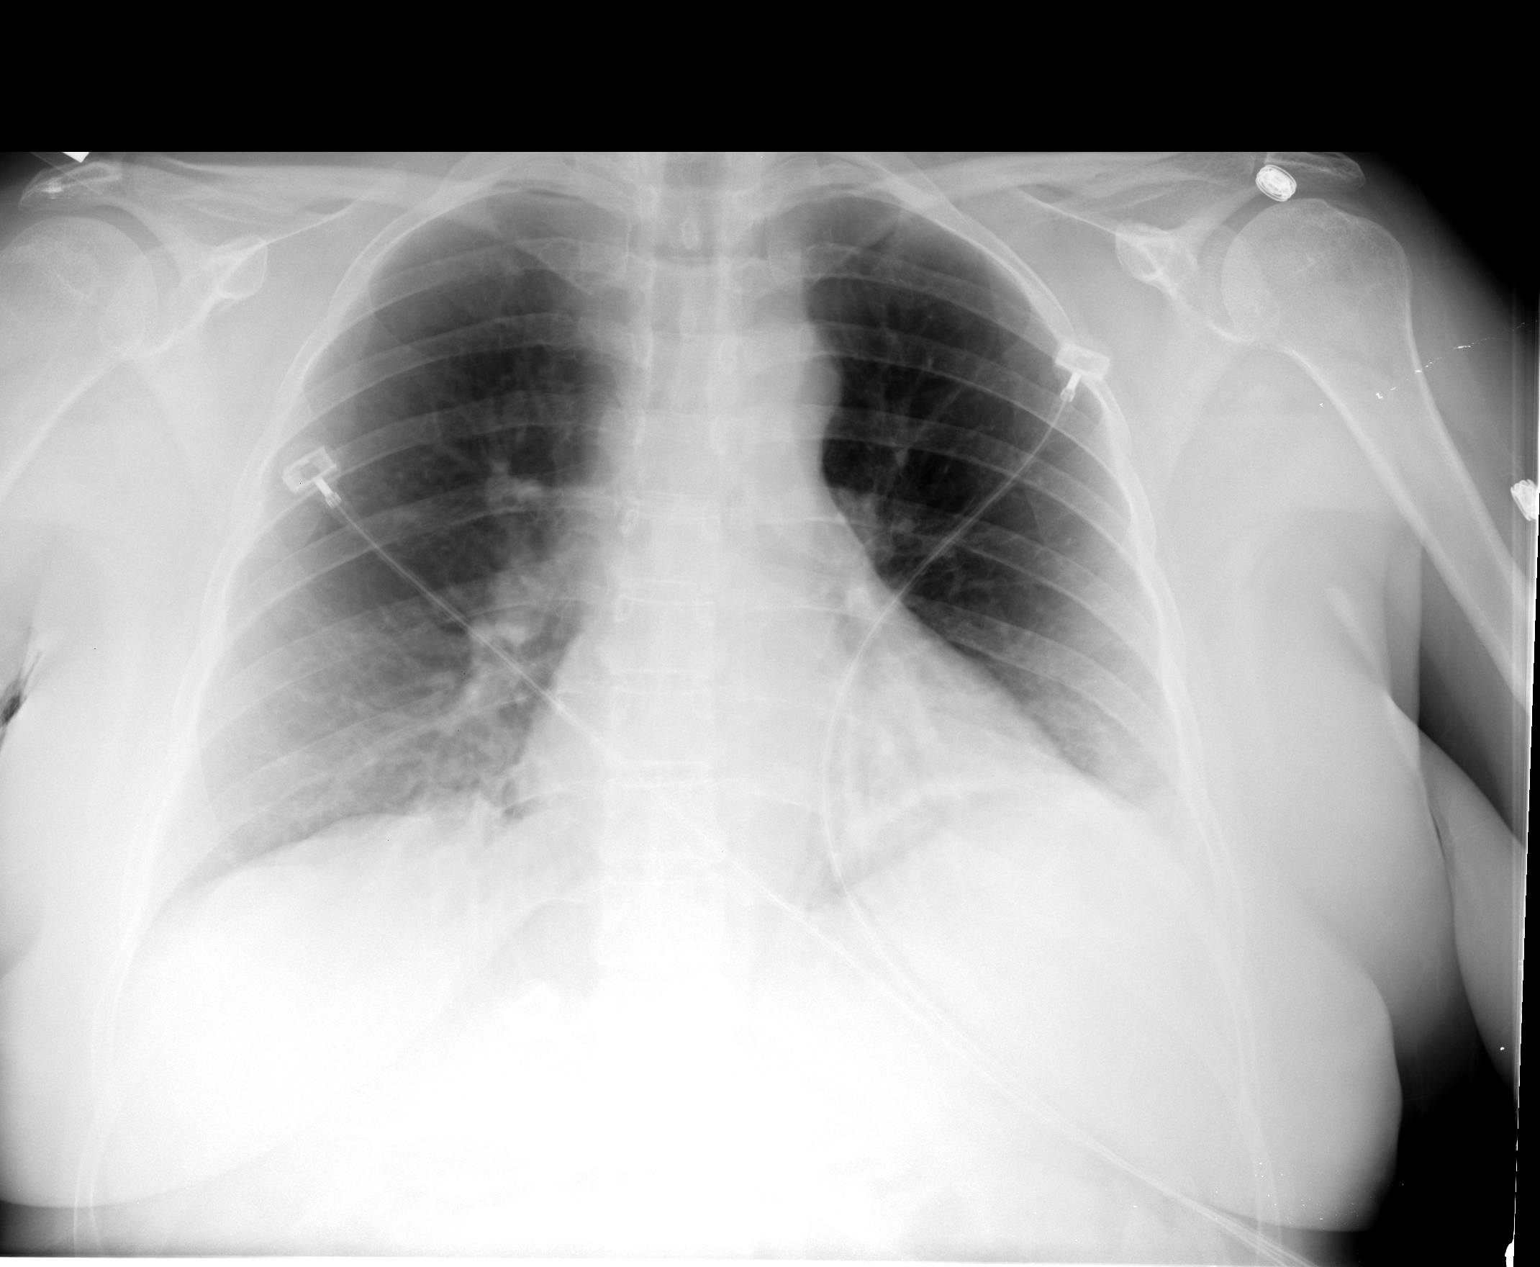

[1 of 1 positions shown; findings below may reference images not displayed]

FINDINGS: Portable AP view at [id number] hours.  Slightly lower lung
volumes.  Mild linear increased retrocardiac opacity.  No
pneumothorax, pulmonary edema, consolidation, or definite effusion.
Cardiac size and mediastinal contours are within normal limits.
IMPRESSION: Increased retrocardiac atelectasis.  No other acute cardiopulmonary
abnormality identified.

## 2012-11-10 IMAGING — CR DG CHEST 1V PORT
1 series · 1 of 1 positions shown · non-contrast
Comparison: Chest x-ray 06/05/2011.

CLINICAL DATA: Chest congestion, cough and shortness of breath.

PORTABLE CHEST - 1 VIEW

[view not recorded]
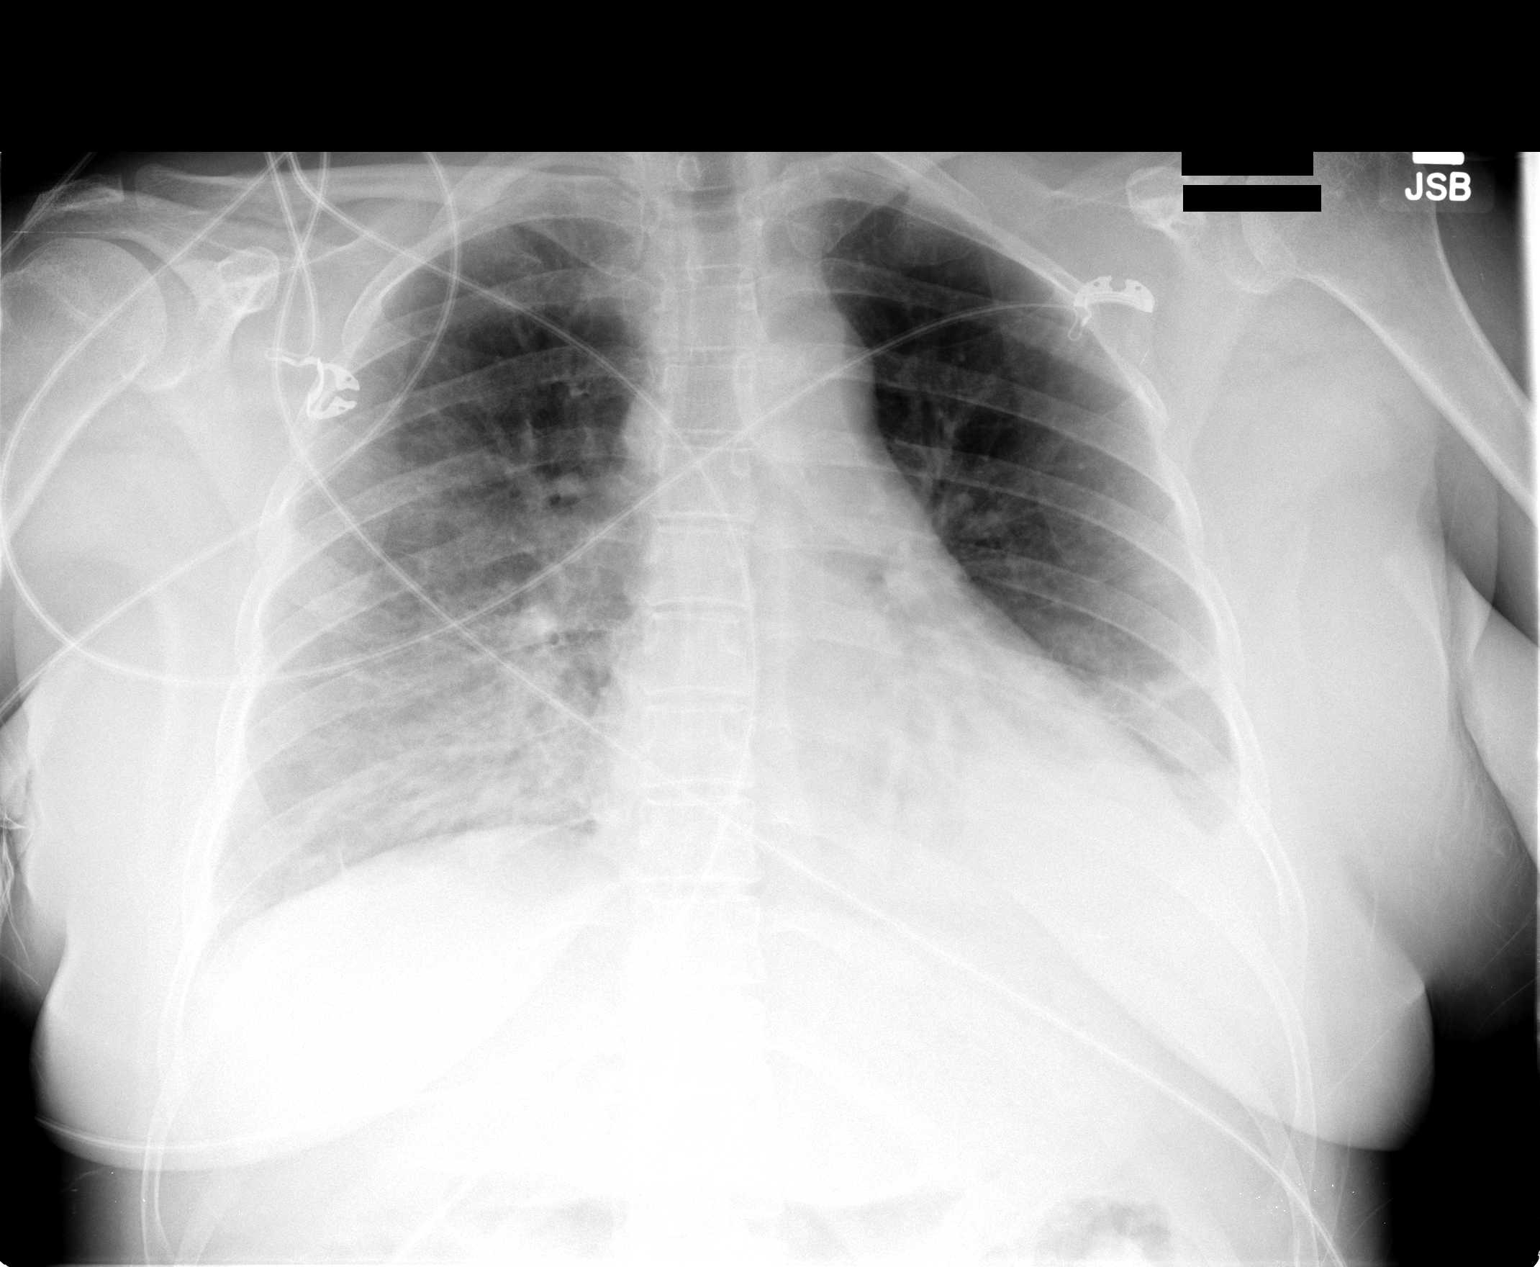

[1 of 1 positions shown; findings below may reference images not displayed]

FINDINGS: Slight worsening bibasilar lung aeration suggesting
bilateral infiltrates.  A left pleural effusion is also noted.
IMPRESSION: Slight worsening bibasilar aeration suggesting bilateral
infiltrates and left pleural effusion.

## 2012-11-10 IMAGING — CR DG CHEST 1V PORT
1 series · 1 of 1 positions shown · non-contrast
Comparison: 06/04/2011

CLINICAL DATA: Change in patient condition.  Fever, congestion,
decreased O2 sats.

PORTABLE CHEST - 1 VIEW

[view not recorded]
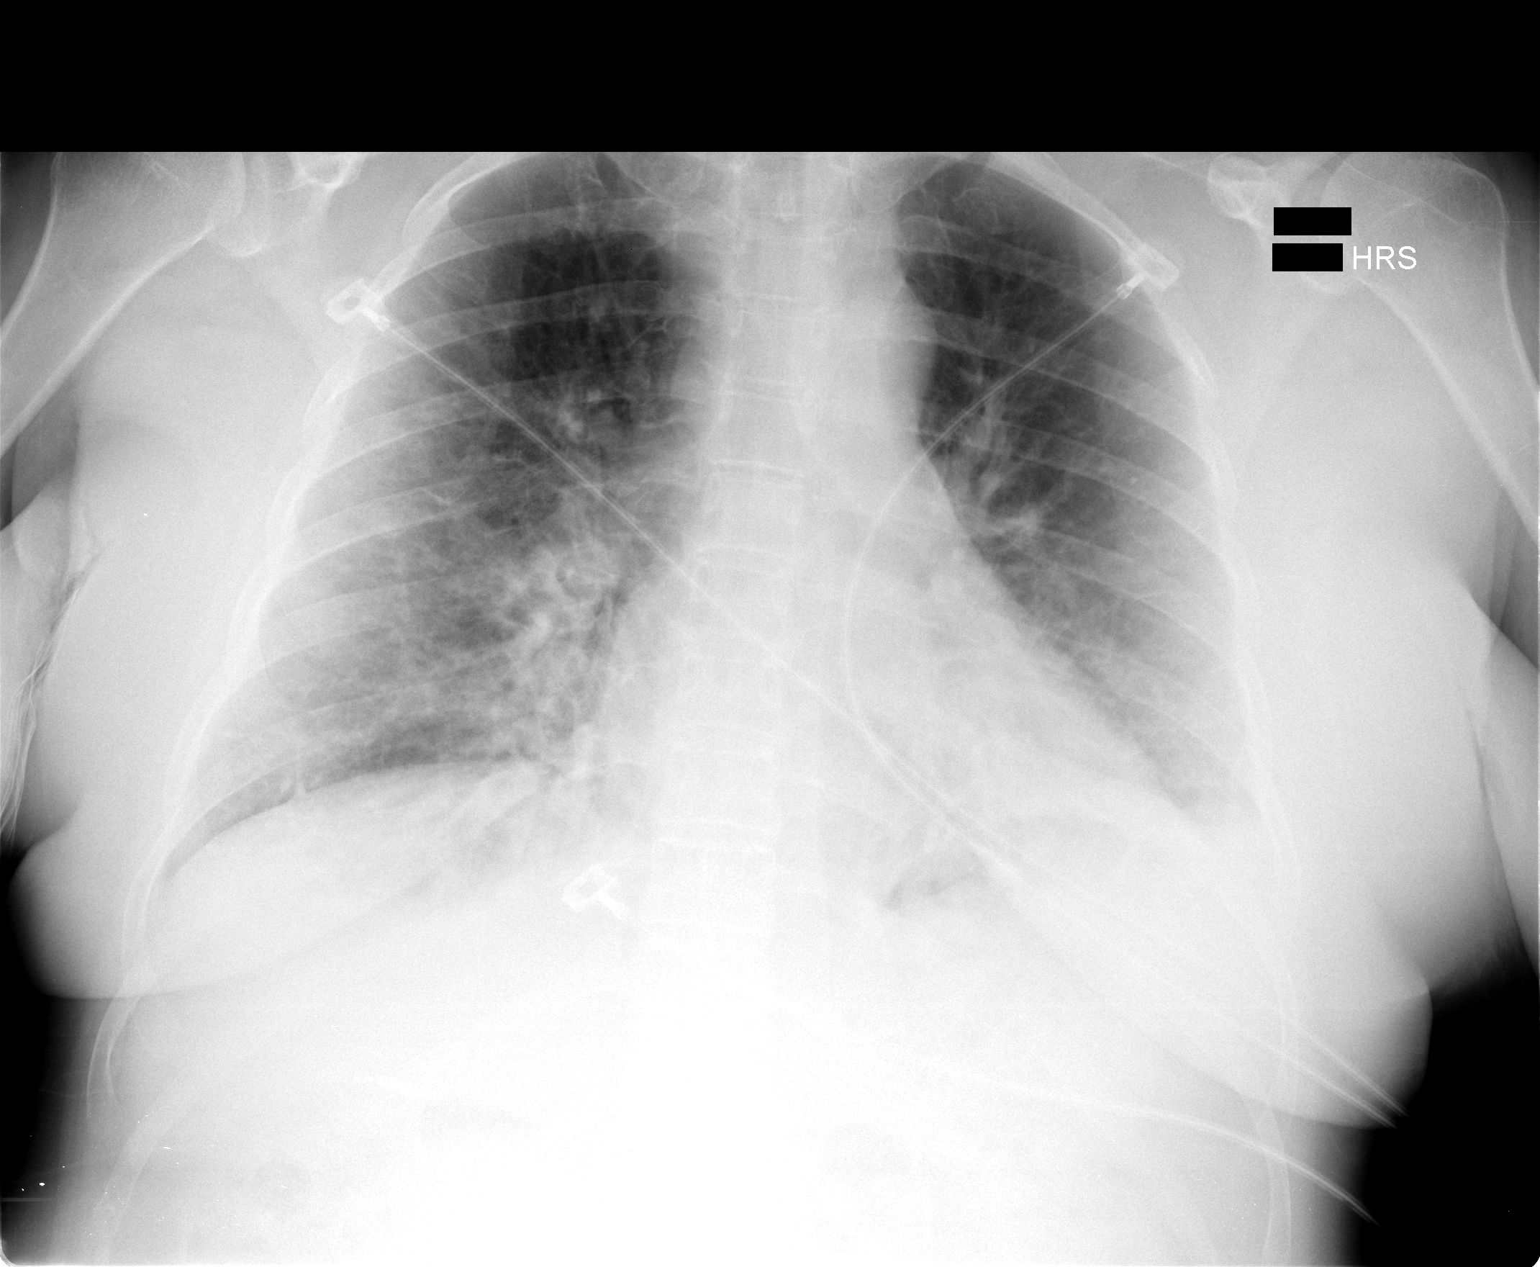

[1 of 1 positions shown; findings below may reference images not displayed]

FINDINGS: Shallow inspiration.  Borderline heart size and pulmonary
vascularity.  There is increasing hazy opacity in the perihilar and
basilar regions suggesting developing pneumonitis or edema.  The
left hemidiaphragm is obscured suggesting small effusion and
basilar atelectasis or infiltration.  Findings are mild but
progressing since the previous study.
IMPRESSION: Suggestion of progressive perihilar edema or pneumonia.
Atelectasis or infiltration and effusion in the left lung base.

## 2012-11-12 ENCOUNTER — Encounter (HOSPITAL_COMMUNITY): Payer: Self-pay | Admitting: Emergency Medicine

## 2012-11-12 ENCOUNTER — Emergency Department (HOSPITAL_COMMUNITY)
Admission: EM | Admit: 2012-11-12 | Discharge: 2012-11-12 | Disposition: A | Discharge status: Home / Self Care | Payer: BC Managed Care – PPO | Attending: Emergency Medicine | Admitting: Emergency Medicine

## 2012-11-12 DIAGNOSIS — R5381 Other malaise: Secondary | ICD-10-CM | POA: Insufficient documentation

## 2012-11-12 DIAGNOSIS — R509 Fever, unspecified: Secondary | ICD-10-CM | POA: Insufficient documentation

## 2012-11-12 IMAGING — CR DG CHEST 1V PORT
1 series · 1 of 1 positions shown · non-contrast
Comparison: 06/05/2011

CLINICAL DATA: Respiratory distress and pulmonary infiltrates.

PORTABLE CHEST - 1 VIEW

[view not recorded]
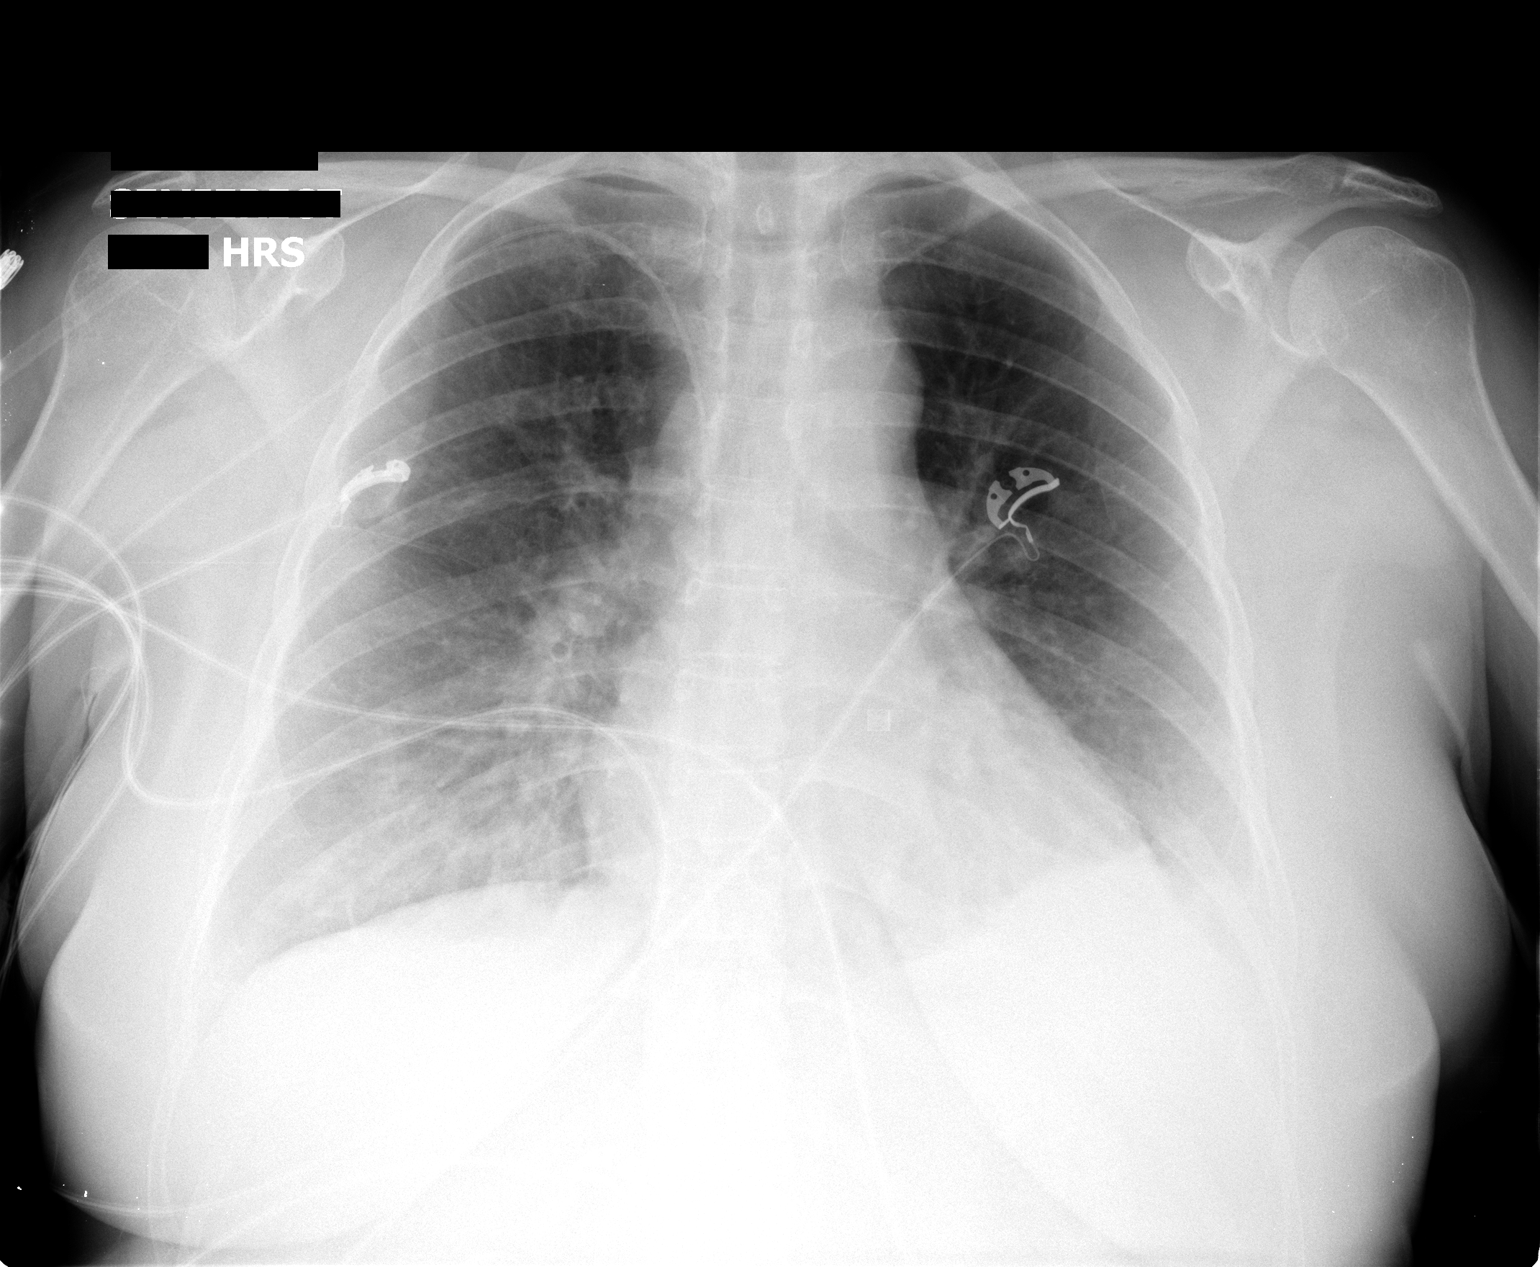

[1 of 1 positions shown; findings below may reference images not displayed]

FINDINGS: Interval placement of right upper extremity PICC line
with catheter tip in the SVC.  Bilateral lower lobe infiltrates
show improvement since prior study with residual infiltrates
remaining.  Lung volumes have increased bilaterally.  Possible
component of small left pleural effusion.  Heart size is stable.
No overt pulmonary edema.
IMPRESSION: PICC line tip in SVC.  Improved aeration and appearance of the
bilateral lower lobe infiltrates.

## 2012-11-12 NOTE — ED Notes (Signed)
Pt not in room, gown laying on bed.   Did not notify staff that she was leaving.

## 2012-11-12 NOTE — ED Notes (Signed)
Pt c/o fatigue/weakness/fever since Sunday. Pt also c/o sinus congestion.

## 2012-11-13 IMAGING — CR DG CHEST 1V PORT
1 series · 1 of 1 positions shown · non-contrast
Comparison: 06/07/2011

CLINICAL DATA: Pulmonary edema

PORTABLE CHEST - 1 VIEW

[view not recorded]
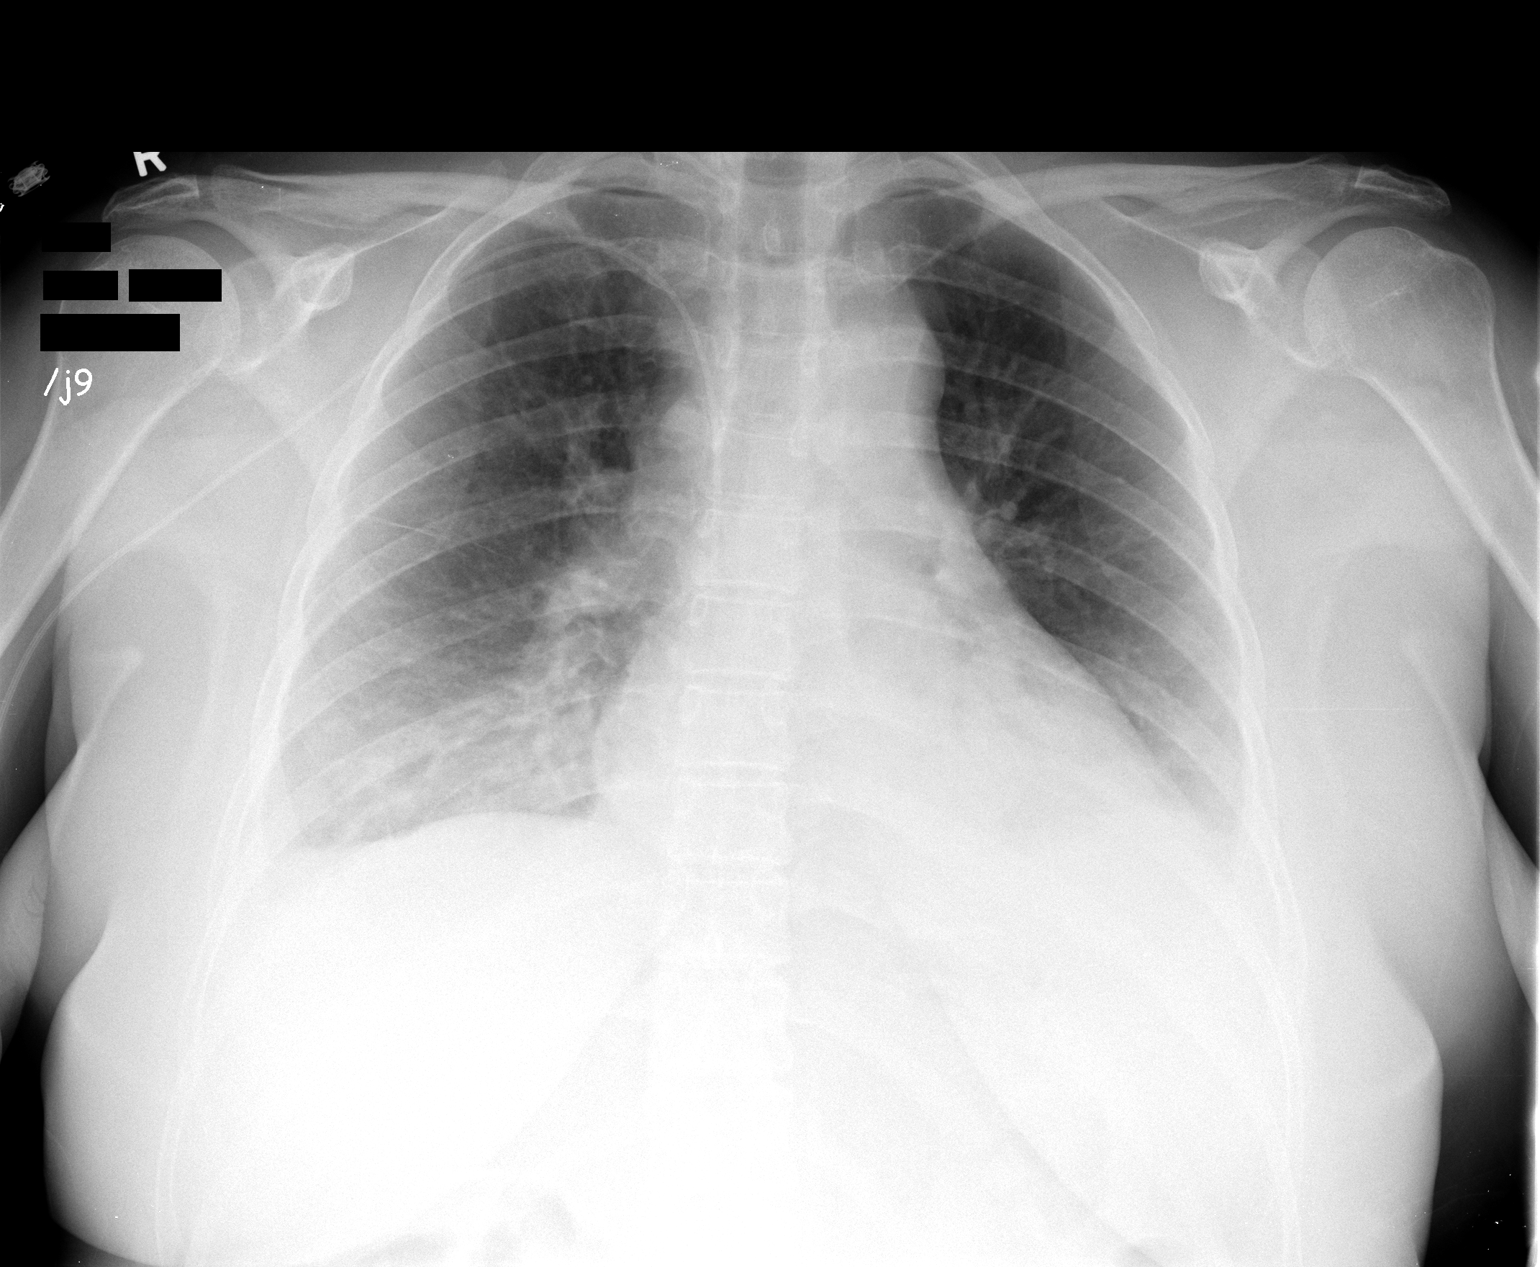

[1 of 1 positions shown; findings below may reference images not displayed]

FINDINGS: Cardiomediastinal silhouette is stable.  Right arm PICC
line is unchanged in position.  No convincing pulmonary edema.
Bilateral basilar atelectasis or infiltrate.  Question trace left
pleural effusion.
IMPRESSION: No convincing pulmonary edema.  Bilateral basilar atelectasis or
infiltrate.  Question trace left pleural effusion.

## 2012-11-17 ENCOUNTER — Telehealth: Payer: Self-pay | Admitting: *Deleted

## 2012-11-17 NOTE — Telephone Encounter (Signed)
Received call from patient stating she needs to get an appt. She has not been seen here since 8/13 due to some insurance issues.  She now has them resolved and needs to come in for her ITP. She has recently had her platelets checked and they have been low.  Her primary cared MD has recently put her on prednisone enough thru the weekend.  Emailed Merry Proud for patient to be seen ASAP.  Will also inform Melissa.  Informed patient if she has not received a call by the first of next week to call back. She verbalized understanding.

## 2012-11-18 ENCOUNTER — Other Ambulatory Visit: Payer: Self-pay | Admitting: *Deleted

## 2012-11-18 ENCOUNTER — Telehealth: Payer: Self-pay | Admitting: Oncology

## 2012-11-18 DIAGNOSIS — D693 Immune thrombocytopenic purpura: Secondary | ICD-10-CM

## 2012-11-18 NOTE — Telephone Encounter (Signed)
Per desk nurse Cherlyn Cushing) r/s former PR w/with next available provider. S/w pt re appt for lb 2/24 and new provider visit w/[facility] 2/28. Lb schedule for 2/24 per desk nurse and she will add order.

## 2012-11-21 ENCOUNTER — Other Ambulatory Visit (HOSPITAL_BASED_OUTPATIENT_CLINIC_OR_DEPARTMENT_OTHER): Payer: BC Managed Care – PPO

## 2012-11-21 DIAGNOSIS — D693 Immune thrombocytopenic purpura: Secondary | ICD-10-CM

## 2012-11-21 LAB — PROTIME-INR
INR: 1 — ABNORMAL LOW (ref 2.00–3.50)
Protime: 12 Seconds (ref 10.6–13.4)

## 2012-11-21 LAB — CBC WITH DIFFERENTIAL/PLATELET
BASO%: 0.2 % (ref 0.0–2.0)
Basophils Absolute: 0 10*3/uL (ref 0.0–0.1)
EOS%: 0 % (ref 0.0–7.0)
HGB: 12.3 g/dL (ref 11.6–15.9)
MCH: 22.8 pg — ABNORMAL LOW (ref 25.1–34.0)
RDW: 16.6 % — ABNORMAL HIGH (ref 11.2–14.5)
WBC: 5.6 10*3/uL (ref 3.9–10.3)
lymph#: 0.6 10*3/uL — ABNORMAL LOW (ref 0.9–3.3)

## 2012-11-23 ENCOUNTER — Telehealth: Payer: Self-pay | Admitting: Oncology

## 2012-11-23 NOTE — Telephone Encounter (Signed)
pt needed to r/s est due to having to drive back to [place] to pick up kids after school

## 2012-11-25 ENCOUNTER — Ambulatory Visit: Payer: BC Managed Care – PPO | Admitting: Oncology

## 2012-12-07 ENCOUNTER — Encounter: Payer: Self-pay | Admitting: Oncology

## 2012-12-07 ENCOUNTER — Ambulatory Visit (HOSPITAL_BASED_OUTPATIENT_CLINIC_OR_DEPARTMENT_OTHER): Payer: BC Managed Care – PPO | Admitting: Oncology

## 2012-12-07 ENCOUNTER — Telehealth: Payer: Self-pay | Admitting: Oncology

## 2012-12-07 ENCOUNTER — Ambulatory Visit (HOSPITAL_BASED_OUTPATIENT_CLINIC_OR_DEPARTMENT_OTHER): Payer: BC Managed Care – PPO | Admitting: Lab

## 2012-12-07 VITALS — BP 115/82 | HR 132 | Temp 97.2°F | Resp 18 | Ht 65.0 in | Wt 202.8 lb

## 2012-12-07 DIAGNOSIS — I2699 Other pulmonary embolism without acute cor pulmonale: Secondary | ICD-10-CM

## 2012-12-07 DIAGNOSIS — D693 Immune thrombocytopenic purpura: Secondary | ICD-10-CM

## 2012-12-07 DIAGNOSIS — K625 Hemorrhage of anus and rectum: Secondary | ICD-10-CM

## 2012-12-07 LAB — CBC WITH DIFFERENTIAL/PLATELET
EOS%: 0.4 % (ref 0.0–7.0)
MCH: 22.8 pg — ABNORMAL LOW (ref 25.1–34.0)
MCV: 71.8 fL — ABNORMAL LOW (ref 79.5–101.0)
MONO%: 10.7 % (ref 0.0–14.0)
RBC: 6.02 10*6/uL — ABNORMAL HIGH (ref 3.70–5.45)
RDW: 17.2 % — ABNORMAL HIGH (ref 11.2–14.5)
nRBC: 0 % (ref 0–0)

## 2012-12-07 LAB — COMPREHENSIVE METABOLIC PANEL (CC13)
CO2: 24 mEq/L (ref 22–29)
Creatinine: 1.5 mg/dL — ABNORMAL HIGH (ref 0.6–1.1)
Glucose: 115 mg/dl — ABNORMAL HIGH (ref 70–99)
Total Bilirubin: 0.65 mg/dL (ref 0.20–1.20)
Total Protein: 6.9 g/dL (ref 6.4–8.3)

## 2012-12-07 LAB — CHCC SMEAR

## 2012-12-07 MED ORDER — PREDNISONE 10 MG PO TABS: 60.0000 mg | ORAL_TABLET | Freq: Every day | ORAL | Status: DC

## 2012-12-07 MED ORDER — MYCOPHENOLATE MOFETIL 500 MG PO TABS: 500.0000 mg | ORAL_TABLET | Freq: Two times a day (BID) | ORAL | Status: DC

## 2012-12-07 NOTE — Telephone Encounter (Signed)
gv and printed appt schedule for pt for March, April, and May...emailed Dr. Lamonte Sakai This pt would like to have the weekly labs [facility] [place].  Is this ok?  If ok will the office need a prescription?

## 2012-12-07 NOTE — Progress Notes (Signed)
[facility]  [phone] [phone]   OFFICE PROGRESS NOTE   Cc:  Glo Herring., MD  DIAGNOSIS:  Recurrent ITP  PAST THERAPY: Prednisone, IVIg (transient response), Rituxan (no response), Cytoxan (no response).  Last was Prednisone 40mg ; 2 wk in 10/2012.  She also was on Prednisone bridged with Cellcept briefly but stopped due to lack of insurance.   CURRENT THERAPY: observation.  Last dose of Prednisone about 2 weeks ago with response.   INTERVAL HISTORY: Summer Cook 47 y.o. female returns to establish care.  She used to see Dr. [name] but transferred her care to Dr. Truddie Coco.  Dr. Truddie Coco recently left the practice.  I assumed her care as of today.  She reported that she was found to have thrombocytopenia really low (unknown how low).  Her PCP placed her on a 2week course of Prednisone with improvement of her plt when she was here in Feb 2014.  She does not want to be on Prednisone long term since she feels bloated, weight gain.  She has had hematochezia for the past 18 months.  It happens everyday; not intensified past year; blood both on toilet paper and mixed in her stools. She denied weight loss, abdominal pain.   She was recently referred to Dr. Sydell Axon.  She denied calf/thigh pain/swelling or chest pain.    The rest of the 14-point review of system was negative.    Past Medical History  Diagnosis Date  . Essential hypertension, benign   . 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  Last was Prednisone 40mg ; 2 wk in 10/2012.  She also was on Prednisone bridged with Cellcept briefly but stopped due to lack of insurance.   . Depression   . Thrush, oral 05/29/2011  . Steroid-induced diabetes   . Pulmonary embolism 04/2012  . Asthma     Past Surgical History  Procedure Laterality Date  . Cholecystectomy    . Bone marrow biopsy    .  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 ALPRAZolam (XANAX) 1 MG tablet Take 1 mg by mouth. Take 1 tablet 3 times daily X 10 days.      Marland Kitchen levofloxacin (LEVAQUIN) 500 MG tablet Take 500 mg by mouth daily. Take 1 tablet by mouth daily X 10 days.      Marland Kitchen triamterene-hydrochlorothiazide (DYAZIDE) 37.5-25 MG per capsule Take 1 capsule by mouth daily.      . vitamin B-12 (CYANOCOBALAMIN) 1000 MCG tablet Take 1,000 mcg by mouth daily.      Marland Kitchen warfarin (COUMADIN) 1 MG tablet Take 1 mg by mouth daily.      . mycophenolate (CELLCEPT) 500 MG tablet Take 1 tablet (500 mg total) by mouth 2 (two) times daily.  60 tablet  3  . predniSONE (DELTASONE) 10 MG tablet Take 6 tablets (60 mg total) by mouth daily.  200 tablet  3  . [DISCONTINUED] prochlorperazine (COMPAZINE) 10 MG tablet Take 10 mg by mouth as needed. For nausea        No current facility-administered medications for this visit.    ALLERGIES:  is allergic to yellow jacket venom.  REVIEW OF SYSTEMS:  The rest of the 14-point review of system was negative.   Filed Vitals:   12/07/12 1332  BP: 115/82  Pulse: 132  Temp: 97.2 F (36.2 C)  Resp: 18  Wt Readings from Last 3 Encounters:  12/07/12 202 lb 12.8 oz (91.989 kg)  11/12/12 205 lb (92.987 kg)  09/08/12 210 lb (95.255 kg)   ECOG Performance status: 1  PHYSICAL EXAMINATION:   General:  Mildly obese woman in no acute distress.  Eyes:  no scleral icterus.  ENT:  There were no oropharyngeal lesions.  Neck was without thyromegaly.  Lymphatics:  Negative cervical, supraclavicular or axillary adenopathy.  Respiratory: lungs were clear bilaterally without wheezing or crackles.  Cardiovascular:  Regular rate and rhythm, S1/S2, without murmur, rub or gallop.  There was no pedal edema.  GI:  abdomen was soft, flat, nontender, nondistended, without organomegaly.  Muscoloskeletal:  no spinal tenderness of palpation of vertebral spine.  Skin  exam was without echymosis, petichae.  Neuro exam was nonfocal.  Patient was able to get on and off exam table without assistance.  Gait was normal.  Patient was alerted and oriented.  Attention was good.   Language was appropriate.  Mood was normal without depression.  Speech was not pressured.  Thought content was not tangential.         LABORATORY/RADIOLOGY DATA:  Lab Results  Component Value Date   WBC 7.2 12/07/2012   HGB 13.7 12/07/2012   HCT 43.2 12/07/2012   PLT 42* 12/07/2012   GLUCOSE 115* 12/07/2012   CHOL 169 02/03/2011   TRIG 98 02/03/2011   HDL 45 02/03/2011   LDLCALC  Value: 104        Total Cholesterol/HDL:CHD Risk Coronary Heart Disease Risk Table                     Men   Women  1/2 Average Risk   3.4   3.3  Average Risk       5.0   4.4  2 X Average Risk   9.6   7.1  3 X Average Risk  23.4   11.0        Use the calculated Patient Ratio above and the CHD Risk Table to determine the patient's CHD Risk.        ATP III CLASSIFICATION (LDL):  <100     mg/dL   Optimal  100-129  mg/dL   Near or Above                    Optimal  130-159  mg/dL   Borderline  160-189  mg/dL   High  >190     mg/dL   Very High* 02/03/2011   ALKPHOS 100 12/07/2012   ALT 33 12/07/2012   AST 26 12/07/2012   NA 140 12/07/2012   K 3.2* 12/07/2012   CL 104 12/07/2012   CREATININE 1.5* 12/07/2012   BUN 23.1 12/07/2012   CO2 24 12/07/2012   INR 2.00 12/07/2012   HGBA1C 7.5* 05/03/2012         ASSESSMENT AND PLAN:    1.  Issue:  Recurrent ITP. 2.  Current status: recurrent ITP today.  3.  Recommendation:    *  Resume Prednisone taper at 60mg  once daily.   *  Also start Cellcept 500mg  PO   *  In the future, if recurrent ITP, we may try oral medication Promecta; thrombopoetin-mimetic.    4.  Rectal bleeding:  See Dr. Sydell Axon soon to see if there are GI source of bleeding.  She has multiple family members with colon cancer.   5.  Pulmonary embolism:  Will reassess in May 2014 to  see if we can go off of Coumadin.    Her INR is therapeutic today.  I recommended no change to her current regimen of Coumadin 6mg  PO daily.  She follows up with her PCP to manage her INR.    6.  Follow up:  Weekly Prednisone to pater .  Follow up in about 2 months.     The length of time of the face-to-face encounter was 25 minutes. More than 50% of time was spent counseling and coordination of care.

## 2012-12-07 NOTE — Patient Instructions (Addendum)
1.  Issue:  Recurrent ITP. 2.  Current status: recurrent ITP today.  3.  Recommendation:    *  Resume Prednisone taper at 60mg  once daily.   *  Also start Cellcept: an oral immunosuppressive therapy normally used in transplant to decrease risk of rejection.  *  In the future, if recurrent ITP, we may try ral medication Promecta; a hormone to stimulate bone marrow to produce more platelet.   4.  Rectal bleeding:  See Dr. Sydell Axon.  5.  Pulmonary embolism:  Let reassess in May 2014 to see if we can go off of Coumadin.   6.  Follow up:  Weekly Prednisone.  Follow up in about 2 months.

## 2012-12-08 ENCOUNTER — Other Ambulatory Visit (HOSPITAL_COMMUNITY): Payer: Self-pay | Admitting: Oncology

## 2012-12-08 DIAGNOSIS — D693 Immune thrombocytopenic purpura: Secondary | ICD-10-CM

## 2012-12-12 ENCOUNTER — Telehealth: Payer: Self-pay | Admitting: *Deleted

## 2012-12-12 ENCOUNTER — Encounter (HOSPITAL_COMMUNITY): Payer: BC Managed Care – PPO | Attending: Oncology

## 2012-12-12 DIAGNOSIS — D693 Immune thrombocytopenic purpura: Secondary | ICD-10-CM | POA: Insufficient documentation

## 2012-12-12 LAB — CBC
HCT: 41 % (ref 36.0–46.0)
Hemoglobin: 12.9 g/dL (ref 12.0–15.0)
MCH: 23 pg — ABNORMAL LOW (ref 26.0–34.0)
MCHC: 31.5 g/dL (ref 30.0–36.0)
MCV: 73 fL — ABNORMAL LOW (ref 78.0–100.0)
Platelets: 292 K/uL (ref 150–400)
RBC: 5.62 MIL/uL — ABNORMAL HIGH (ref 3.87–5.11)
RDW: 17.9 % — ABNORMAL HIGH (ref 11.5–15.5)
WBC: 8.7 K/uL (ref 4.0–10.5)

## 2012-12-12 NOTE — Telephone Encounter (Signed)
Spoke w/ pt and informed her of Platelet result today and message from Dr. Lamonte Sakai.  She verbalized understanding to decrease prednisone to 50 mg daily and continue Cellcept 500 mg twice daily.  She says her labs are scheduled at [facility] and to cancel the weekly labs that were scheduled here at [facility].

## 2012-12-12 NOTE — Progress Notes (Signed)
Labs drawn today for cbc 

## 2012-12-12 NOTE — Telephone Encounter (Signed)
Message copied by Maudie Mercury on Mon Dec 12, 2012  4:47 PM ------      Message from: HA, Trudee Grip T      Created: Mon Dec 12, 2012 12:19 PM       Please call pt.  Her ITP has responded really well to Prednisone.  Please advise her to decrease it from Prednisone 60mg  PO daily to 50mg  PO daily.  She should continue her Cellcept at current dose (should be 500mg  PO BID).  Thanks. ------

## 2012-12-14 ENCOUNTER — Other Ambulatory Visit: Payer: BC Managed Care – PPO

## 2012-12-15 ENCOUNTER — Telehealth: Payer: Self-pay | Admitting: Oncology

## 2012-12-15 NOTE — Telephone Encounter (Signed)
CANCELLED ALL THE WEEKLY LABS PER 3/17 POF PT TO HAVE THEM AT [facility]

## 2012-12-19 ENCOUNTER — Encounter (HOSPITAL_BASED_OUTPATIENT_CLINIC_OR_DEPARTMENT_OTHER): Payer: BC Managed Care – PPO

## 2012-12-19 ENCOUNTER — Telehealth: Payer: Self-pay | Admitting: *Deleted

## 2012-12-19 DIAGNOSIS — D693 Immune thrombocytopenic purpura: Secondary | ICD-10-CM

## 2012-12-19 LAB — CBC
HCT: 39.6 % (ref 36.0–46.0)
Hemoglobin: 12.3 g/dL (ref 12.0–15.0)
MCH: 22.7 pg — ABNORMAL LOW (ref 26.0–34.0)
MCHC: 31.1 g/dL (ref 30.0–36.0)

## 2012-12-19 NOTE — Progress Notes (Signed)
Labs drawn today for cbc 

## 2012-12-19 NOTE — Telephone Encounter (Signed)
Message copied by Randolm Idol on Mon Dec 19, 2012 12:32 PM ------      Message from: HA, Trudee Grip T      Created: Mon Dec 19, 2012 10:34 AM       Please call patient. Her ITP is responding.      Please advise her to decrease prednisone to 40 mg daily and continue Cellcept 500 mg twice daily.  Keep having her CBC drawn at [facility] for now.  Thanks. ------

## 2012-12-19 NOTE — Telephone Encounter (Signed)
Message copied by Randolm Idol on Mon Dec 19, 2012 12:30 PM ------      Message from: HA, Trudee Grip T      Created: Mon Dec 19, 2012 10:34 AM       Please call patient. Her ITP is responding.      Please advise her to decrease prednisone to 40 mg daily and continue Cellcept 500 mg twice daily.  Keep having her CBC drawn at [facility] for now.  Thanks. ------

## 2012-12-19 NOTE — Telephone Encounter (Signed)
Lm for patient to call me back,

## 2012-12-21 ENCOUNTER — Other Ambulatory Visit: Payer: BC Managed Care – PPO

## 2012-12-22 ENCOUNTER — Emergency Department (HOSPITAL_COMMUNITY): Payer: BC Managed Care – PPO

## 2012-12-22 ENCOUNTER — Emergency Department (HOSPITAL_COMMUNITY)
Admission: EM | Admit: 2012-12-22 | Discharge: 2012-12-22 | Disposition: A | Discharge status: Home / Self Care | Payer: BC Managed Care – PPO | Attending: Emergency Medicine | Admitting: Emergency Medicine

## 2012-12-22 ENCOUNTER — Encounter (HOSPITAL_COMMUNITY): Payer: Self-pay

## 2012-12-22 DIAGNOSIS — F329 Major depressive disorder, single episode, unspecified: Secondary | ICD-10-CM | POA: Insufficient documentation

## 2012-12-22 DIAGNOSIS — Z862 Personal history of diseases of the blood and blood-forming organs and certain disorders involving the immune mechanism: Secondary | ICD-10-CM | POA: Insufficient documentation

## 2012-12-22 DIAGNOSIS — J45901 Unspecified asthma with (acute) exacerbation: Secondary | ICD-10-CM | POA: Insufficient documentation

## 2012-12-22 DIAGNOSIS — Z7901 Long term (current) use of anticoagulants: Secondary | ICD-10-CM | POA: Insufficient documentation

## 2012-12-22 DIAGNOSIS — Y939 Activity, unspecified: Secondary | ICD-10-CM | POA: Insufficient documentation

## 2012-12-22 DIAGNOSIS — Z8619 Personal history of other infectious and parasitic diseases: Secondary | ICD-10-CM | POA: Insufficient documentation

## 2012-12-22 DIAGNOSIS — S9032XA Contusion of left foot, initial encounter: Secondary | ICD-10-CM

## 2012-12-22 DIAGNOSIS — Z87891 Personal history of nicotine dependence: Secondary | ICD-10-CM | POA: Insufficient documentation

## 2012-12-22 DIAGNOSIS — F3289 Other specified depressive episodes: Secondary | ICD-10-CM | POA: Insufficient documentation

## 2012-12-22 DIAGNOSIS — Z79899 Other long term (current) drug therapy: Secondary | ICD-10-CM | POA: Insufficient documentation

## 2012-12-22 DIAGNOSIS — IMO0002 Reserved for concepts with insufficient information to code with codable children: Secondary | ICD-10-CM | POA: Insufficient documentation

## 2012-12-22 DIAGNOSIS — E139 Other specified diabetes mellitus without complications: Secondary | ICD-10-CM | POA: Insufficient documentation

## 2012-12-22 DIAGNOSIS — Z86711 Personal history of pulmonary embolism: Secondary | ICD-10-CM | POA: Insufficient documentation

## 2012-12-22 DIAGNOSIS — Y9389 Activity, other specified: Secondary | ICD-10-CM | POA: Insufficient documentation

## 2012-12-22 DIAGNOSIS — Y929 Unspecified place or not applicable: Secondary | ICD-10-CM | POA: Insufficient documentation

## 2012-12-22 DIAGNOSIS — S9030XA Contusion of unspecified foot, initial encounter: Secondary | ICD-10-CM | POA: Insufficient documentation

## 2012-12-22 DIAGNOSIS — I10 Essential (primary) hypertension: Secondary | ICD-10-CM | POA: Insufficient documentation

## 2012-12-22 MED ORDER — HYDROCODONE-ACETAMINOPHEN 5-325 MG PO TABS: 1.0000 | ORAL_TABLET | ORAL | Status: DC | PRN

## 2012-12-22 MED ORDER — HYDROCODONE-ACETAMINOPHEN 5-325 MG PO TABS
2.0000 | ORAL_TABLET | Freq: Once | ORAL | Status: AC
  Administered 2012-12-22: 2 via ORAL
  Filled 2012-12-22 (×2): qty 1

## 2012-12-22 NOTE — ED Provider Notes (Signed)
Medical screening examination/treatment/procedure(s) were performed by non-physician practitioner and as supervising physician I was immediately available for consultation/collaboration.  [name], MD 12/22/12 [phone]

## 2012-12-22 NOTE — ED Provider Notes (Signed)
History     CSN: [id number]  Arrival date & time 12/22/12  1521   First MD Initiated Contact with Patient 12/22/12 1555      Chief Complaint  Patient presents with  . Foot Pain    (Consider location/radiation/quality/duration/timing/severity/associated sxs/prior treatment) Patient is a 47 y.o. female presenting with lower extremity pain. The history is provided by the patient.  Foot Pain This is a new problem. The current episode started yesterday. The problem occurs constantly. The problem has been gradually worsening. Pertinent negatives include no abdominal pain, arthralgias, chest pain, coughing or neck pain. The symptoms are aggravated by standing and walking. She has tried oral narcotics for the symptoms.    Past Medical History  Diagnosis Date  . Essential hypertension, benign   . 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  Last was Prednisone 40mg ; 2 wk in 10/2012.  She also was on Prednisone bridged with Cellcept briefly but stopped due to lack of insurance.   . Depression   . Thrush, oral 05/29/2011  . Steroid-induced diabetes   . Pulmonary embolism 04/2012  . Asthma     Past Surgical History  Procedure Laterality Date  . Cholecystectomy    . Bone marrow biopsy    . 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 Cancer Mother     cervical.   . Cervical cancer Mother   . Lung cancer Father   . Cancer Father     lung  . Pneumonia Brother   . Cancer Paternal Grandmother 11    colon cancer, breast cancer  . Cancer Paternal Grandfather 58    colon cancer    History  Substance Use Topics  . Smoking status: Former Smoker    Types: Cigarettes    Quit date: 02/15/2011  . Smokeless tobacco: Never Used  . Alcohol Use: No    OB History   Grav Para Term Preterm Abortions TAB SAB Ect Mult Living                   Review of Systems  Constitutional: Negative for activity change.       All ROS Neg except as noted in HPI  HENT: Negative for nosebleeds and neck pain.   Eyes: Negative for photophobia and discharge.  Respiratory: Positive for wheezing. Negative for cough and shortness of breath.   Cardiovascular: Negative for chest pain and palpitations.  Gastrointestinal: Negative for abdominal pain and blood in stool.  Genitourinary: Negative for dysuria, frequency and hematuria.  Musculoskeletal: Negative for back pain and arthralgias.  Skin: Negative.   Neurological: Negative for dizziness, seizures and speech difficulty.  Psychiatric/Behavioral: Negative for hallucinations and confusion.       Depression    Allergies  Yellow jacket venom  Home Medications   Current Outpatient Rx  Name  Route  Sig  Dispense  Refill  . ALPRAZolam (XANAX) 1 MG tablet   Oral   Take 1 mg by mouth 3 (three) times daily as needed. Take 1 tablet 3 times daily X 10 days.         Marland Kitchen HYDROcodone-acetaminophen (NORCO/VICODIN) 5-325 MG per tablet   Oral   Take 1 tablet by mouth every 6 (six) hours as needed for pain.         . mycophenolate (CELLCEPT) 500 MG tablet   Oral   Take 1 tablet (  500 mg total) by mouth 2 (two) times daily.   60 tablet   3     Take 500mg  PO daily x 5 days; then if tolerate wel ...   . predniSONE (DELTASONE) 10 MG tablet   Oral   Take 40 mg by mouth daily.         Marland Kitchen triamterene-hydrochlorothiazide (DYAZIDE) 37.5-25 MG per capsule   Oral   Take 1 capsule by mouth daily.         . vitamin B-12 (CYANOCOBALAMIN) 1000 MCG tablet   Oral   Take 2,000 mcg by mouth daily.          Marland Kitchen warfarin (COUMADIN) 1 MG tablet   Oral   Take 6 mg by mouth every evening.            BP 129/75  Pulse 84  Temp(Src) 97.5 F (36.4 C) (Oral)  Resp 18  Ht 5\' 5"  (1.651 m)  Wt 208 lb (94.348 kg)  BMI 34.61 kg/m2  SpO2 100%  Physical Exam  Nursing note and vitals  reviewed. Constitutional: She is oriented to person, place, and time. She appears well-developed and well-nourished.  Non-toxic appearance.  HENT:  Head: Normocephalic.  Right Ear: Tympanic membrane and external ear normal.  Left Ear: Tympanic membrane and external ear normal.  Eyes: EOM and lids are normal. Pupils are equal, round, and reactive to light.  Neck: Normal range of motion. Neck supple. Carotid bruit is not present.  Cardiovascular: Normal rate, regular rhythm, normal heart sounds, intact distal pulses and normal pulses.   Pulmonary/Chest: Breath sounds normal. No respiratory distress.  Abdominal: Soft. Bowel sounds are normal. There is no tenderness. There is no guarding.  Musculoskeletal: Normal range of motion.  There is good range of motion of the left hip and knee. There is no deformity of the tibial area. There is soreness with attempted range of motion of the left ankle. There is bruising at the base of the second third and fourth toes. The toes are swollen. There is mild swelling of the dorsum of the left foot. The metatarsal heads are nondisplaced. There no lesions between the toes, and there no puncture marks noted at the plantar surface of the left foot. The dorsalis pedis pulses 2+.  Lymphadenopathy:       Head (right side): No submandibular adenopathy present.       Head (left side): No submandibular adenopathy present.    She has no cervical adenopathy.  Neurological: She is alert and oriented to person, place, and time. She has normal strength. No cranial nerve deficit or sensory deficit.  Skin: Skin is warm and dry.  Psychiatric: She has a normal mood and affect. Her speech is normal.    ED Course  Procedures (including critical care time)  Labs Reviewed - No data to display Dg Foot Complete Left  12/22/2012  *RADIOLOGY REPORT*  Clinical Data: Fall, pain and bruising.  LEFT FOOT - COMPLETE 3+ VIEW  Comparison: None.  Findings: Imaged bones, joints and soft  tissues appear normal.  IMPRESSION: Negative exam.   Original Report Authenticated By: Orlean Patten, M.D.      No diagnosis found.    MDM  I have reviewed nursing notes, vital signs, and all appropriate lab and imaging results for this patient. Patient states that after be coming frustrated on last evening she kicked a chair. She all awakened this morning and noted swelling and bruising of the left foot.  The x-ray of the  left foot is negative for fracture or dislocation. The patient has been advised of these findings. Patient agrees to keep the left foot elevated. She will use Norco every 4 hours as needed for pain. Patient is to see her primary physician if any changes, problems, or concerns.  Patient is on Coumadin but recently had her INR checked.       Lenox Ahr, PA-C 12/22/12 [phone]

## 2012-12-22 NOTE — ED Notes (Signed)
Pt kicked a chair last night with left foot, woke this am w/ toes bruised, placed a ace wrap to foot and now more swollen. Also wants her ears checked.

## 2012-12-26 ENCOUNTER — Encounter (HOSPITAL_BASED_OUTPATIENT_CLINIC_OR_DEPARTMENT_OTHER): Payer: BC Managed Care – PPO

## 2012-12-26 ENCOUNTER — Encounter: Payer: Self-pay | Admitting: Gastroenterology

## 2012-12-26 ENCOUNTER — Encounter (HOSPITAL_COMMUNITY): Payer: Self-pay | Admitting: Pharmacy Technician

## 2012-12-26 ENCOUNTER — Ambulatory Visit (INDEPENDENT_AMBULATORY_CARE_PROVIDER_SITE_OTHER): Payer: BC Managed Care – PPO | Admitting: Gastroenterology

## 2012-12-26 ENCOUNTER — Telehealth: Payer: Self-pay | Admitting: *Deleted

## 2012-12-26 VITALS — BP 140/85 | HR 75 | Temp 97.5°F | Ht 65.0 in | Wt 215.8 lb

## 2012-12-26 DIAGNOSIS — R1013 Epigastric pain: Secondary | ICD-10-CM

## 2012-12-26 DIAGNOSIS — K5900 Constipation, unspecified: Secondary | ICD-10-CM

## 2012-12-26 DIAGNOSIS — K625 Hemorrhage of anus and rectum: Secondary | ICD-10-CM

## 2012-12-26 DIAGNOSIS — K921 Melena: Secondary | ICD-10-CM

## 2012-12-26 DIAGNOSIS — Z7901 Long term (current) use of anticoagulants: Secondary | ICD-10-CM

## 2012-12-26 DIAGNOSIS — D693 Immune thrombocytopenic purpura: Secondary | ICD-10-CM

## 2012-12-26 LAB — CBC
HCT: 40.2 % (ref 36.0–46.0)
MCHC: 31.3 g/dL (ref 30.0–36.0)
MCV: 73.9 fL — ABNORMAL LOW (ref 78.0–100.0)
RDW: 18.9 % — ABNORMAL HIGH (ref 11.5–15.5)

## 2012-12-26 MED ORDER — PEG 3350-KCL-NA BICARB-NACL 420 G PO SOLR: 4000.0000 mL | ORAL | Status: DC

## 2012-12-26 MED ORDER — POLYETHYLENE GLYCOL 3350 17 GM/SCOOP PO POWD: ORAL | Status: DC

## 2012-12-26 NOTE — Telephone Encounter (Signed)
Message copied by Maudie Mercury on Mon Dec 26, 2012  2:30 PM ------      Message from: HA, Trudee Grip T      Created: Mon Dec 26, 2012 11:09 AM       Please call patient. Her ITP continues to reponsd.            Please advise her to decrease prednisone to 30 mg daily and continue Cellcept 500 mg twice daily.  Keep having her CBC drawn at [facility] for now.  Thanks.       ------

## 2012-12-26 NOTE — Patient Instructions (Addendum)
We have scheduled you for a colonoscopy and upper endoscopy with Dr. Gala Romney. Please see separate instructions.

## 2012-12-26 NOTE — Progress Notes (Signed)
Labs drawn today for cbc 

## 2012-12-26 NOTE — Telephone Encounter (Signed)
Called pt w/ lab results and orders from Dr. Lamonte Sakai to decrease prednisone to 30 mg and continue Cellcept 500 mg BID.  Keep weekly lab appts as scheduled.  She verbalized understanding.  She also reports a sore throat and slight ear ache for a few days w/o any fevers.  Instructed her to call her PCP if she runs any fevers or her ear/throat do not improve or get worse w/i next few days.  She verbalized understanding.  She also reports saw GI MD, Dr. Sydell Axon, this morning and plan for EGD/ Colonoscopy on 4/ 10/14.  They will contact Dr. Lamonte Sakai for clearance from hematology standpoint.

## 2012-12-26 NOTE — Progress Notes (Signed)
Primary Care Physician:  Glo Herring., MD  Primary Gastroenterologist:  Garfield Cornea, MD   Chief Complaint  Patient presents with  . Rectal Bleeding  . Hemorrhoids  . Abdominal Pain    HPI:  Summer Cook is a 47 y.o. female here for further evaluation of rectal bleeding, abdominal pain, questionable melena. She was referred by [name] for further evaluation of these symptoms.  The patient has a history of ITP, currently managed by Dr. Lamonte Sakai. Patient currently on prednisone taper, CellCept. 2 weeks ago her platelet house 42,000, currently it is 336,000. Patient is also on Coumadin, she had a pulmonary embolus in August of 2013 (unclear etiology). She had hypercoagulable workup but she's not aware of the findings.   Patient complains rectal bleeding noted with every bowel movement.Symptoms for over one year. BRBPR, increased amount lately. Initially thought it was just hemorrhoids. One month ago, several stools were very dark in color, no Pepto-Bismol. Also with lots of fresh blood noted. Happened two to three times. Complains of constipation. Both paternal grandparents had colon cancer, GF age 58 when passed, GM age 24. Nausea intermittent. Heartburn frequent. No dysphagia. Epigastric discomfort, feels bloated and tight. BM QOD when on prednisone. Other times may skip 3 days. Straining most of the time. Abdominal cramping associated with it. No prior EGD/TCS.   Patient reports that she stopped most of her medications back around December because she just wasn't feeling well. She felt it may be a side effect of her medications. She also reports that she had a lapse in her insurance which led her to not be able to afford some of her medications. She was actually off her Coumadin up until about one month ago. Coumadin is managed by [name].    Current Outpatient Prescriptions  Medication Sig Dispense Refill  . ALPRAZolam (XANAX) 1 MG tablet Take 1 mg by mouth 3 (three) times daily as  needed for anxiety.       Marland Kitchen escitalopram (LEXAPRO) 10 MG tablet Take 10 mg by mouth daily.      Marland Kitchen HYDROcodone-acetaminophen (NORCO/VICODIN) 5-325 MG per tablet Take 1 tablet by mouth every 6 (six) hours as needed for pain.      . predniSONE (DELTASONE) 10 MG tablet Take 30 mg by mouth daily.       Marland Kitchen triamterene-hydrochlorothiazide (DYAZIDE) 37.5-25 MG per capsule Take 1 capsule by mouth daily.      . vitamin B-12 (CYANOCOBALAMIN) 1000 MCG tablet Take 2,000 mcg by mouth 2 (two) times daily.       Marland Kitchen warfarin (COUMADIN) 1 MG tablet Take 6 mg by mouth every evening.       Marland Kitchen albuterol (PROVENTIL HFA;VENTOLIN HFA) 108 (90 BASE) MCG/ACT inhaler Inhale 2 puffs into the lungs every 6 (six) hours as needed for wheezing.      . Cholecalciferol (VITAMIN D) 2000 UNITS CAPS Take 1 capsule by mouth daily.      . mycophenolate (CELLCEPT) 500 MG tablet Take 500 mg by mouth 2 (two) times daily.      . polyethylene glycol-electrolytes (TRILYTE) 420 G solution Take 4,000 mLs by mouth as directed.  4000 mL  0  . [DISCONTINUED] prochlorperazine (COMPAZINE) 10 MG tablet Take 10 mg by mouth as needed. For nausea        No current facility-administered medications for this visit.    Allergies as of 12/26/2012 - Review Complete 12/26/2012  Allergen Reaction Noted  . Yellow jacket venom Swelling 08/19/2011  Past Medical History  Diagnosis Date  . Essential hypertension, benign   . 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  Last was Prednisone 40mg ; 2 wk in 10/2012.  She also was on Prednisone bridged with Cellcept briefly but stopped due to lack of insurance.   . Depression   . Thrush, oral 05/29/2011  . Steroid-induced diabetes   . Pulmonary embolism 04/2012  . Asthma   . Renal insufficiency     Past Surgical History  Procedure Laterality Date  . Cholecystectomy  2008  . Bone marrow biopsy    .  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 Cervical cancer Mother   . Lung cancer Father   . Pneumonia Brother   . Cancer Paternal Grandmother 50    colon cancer, breast cancer  . Cancer Paternal Grandfather 70    colon cancer  . AAA (abdominal aortic aneurysm) Paternal Grandmother     History   Social History  . Marital Status: Divorced    Spouse Name: N/A    Number of Children: 1  . Years of Education: N/A   Occupational History  .      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    Social History Main Topics  . Smoking status: Former Smoker    Types: Cigarettes    Quit date: 02/15/2011  . Smokeless tobacco: Never Used  . Alcohol Use: No  . Drug Use: No  . Sexually Active: No   Other Topics Concern  . Not on file   Social History Narrative  . No narrative on file      ROS:  General: Negative for anorexia, weight loss, fever, chills, fatigue, weakness. Eyes: Negative for vision changes.  ENT: Negative for hoarseness, difficulty swallowing , nasal congestion. CV: Negative for chest pain, angina, palpitations, dyspnea on exertion, peripheral edema.  Respiratory: Negative for dyspnea at rest, dyspnea on exertion, cough, sputum, wheezing.  GI: See history of present illness. GU:  Negative for dysuria, hematuria, urinary incontinence, urinary frequency, nocturnal urination.  MS: Negative for joint pain, low back pain.  Derm: Negative for rash or itching.  Neuro: Negative for weakness, abnormal sensation, seizure, frequent headaches, memory loss, confusion.  Psych: complains of anxiety and depression but no suicidal ideation, hallucinations.  Endo: Negative for unusual weight change.  Heme: Negative for bruising or bleeding. Allergy: Negative for rash or hives.    Physical Examination:  BP 140/85  Pulse 75  Temp(Src) 97.5 F (36.4 C) (Oral)  Ht 5\' 5"  (1.651 m)  Wt 215 lb 12.8 oz (97.886 kg)  BMI 35.91 kg/m2   General:  Well-nourished, well-developed in no acute distress.  Head: Normocephalic, atraumatic.   Eyes: Conjunctiva pink, no icterus. Mouth: Oropharyngeal mucosa moist and pink , no lesions erythema or exudate. Neck: Supple without thyromegaly, masses, or lymphadenopathy.  Lungs: Clear to auscultation bilaterally.  Heart: Regular rate and rhythm, no murmurs rubs or gallops.  Abdomen: Bowel sounds are normal, moderate epigastric tenderness, nondistended, no hepatosplenomegaly or masses, no abdominal bruits or    hernia , no rebound or guarding.   Rectal: not performed Extremities: No lower extremity edema. No clubbing or deformities.  Neuro: Alert and oriented x 4 , grossly normal neurologically.  Skin: Warm and dry, no rash or jaundice.   Psych: Alert and cooperative, normal mood and affect.  Labs: Lab Results  Component Value Date   WBC 11.7* 12/26/2012   HGB 12.6 12/26/2012   HCT 40.2 12/26/2012   MCV 73.9* 12/26/2012   PLT 336 12/26/2012   Lab Results  Component Value Date   ALT 33 12/07/2012   AST 26 12/07/2012   ALKPHOS 100 12/07/2012   BILITOT 0.65 12/07/2012   Lab Results  Component Value Date   CREATININE 1.5* 12/07/2012   BUN 23.1 12/07/2012   NA 140 12/07/2012   K 3.2* 12/07/2012   CL 104 12/07/2012   CO2 24 12/07/2012

## 2012-12-27 ENCOUNTER — Encounter: Payer: Self-pay | Admitting: Gastroenterology

## 2012-12-27 DIAGNOSIS — K5900 Constipation, unspecified: Secondary | ICD-10-CM | POA: Insufficient documentation

## 2012-12-27 NOTE — Progress Notes (Signed)
CC PCP 

## 2012-12-27 NOTE — Assessment & Plan Note (Signed)
Add MiraLax 17 g daily as needed.

## 2012-12-27 NOTE — Assessment & Plan Note (Addendum)
One-year history of progressive rectal bleeding and possible melena. Symptoms associated with epigastric pain, constipation. Complicated history including idiopathic thrombocytopenic purpura with platelet count in the 40,000s 2 weeks ago. Patient currently on prednisone and CellCept, managed by Dr. Lamonte Sakai. She also has history of pulmonary embolus diagnosed in August of 2013, on Coumadin, managed by [name]. Patient is unaware of any hypercoagulable workup done. Have requested that she discuss with Dr. Lamonte Sakai at her next visit.  With regards to rectal bleeding, she may have an anal rectal fissure and/or hemorrhoids but cannot rule out polyps or malignancy. She has been able to maintain a normal hemoglobin. She will need to have a colonoscopy for further evaluation.  I have discussed the risks, alternatives, benefits with regards to but not limited to the risk of reaction to medication, bleeding, infection, perforation and the patient is agreeable to proceed. Written consent to be obtained. Phenergan 25mg  IV 30 minutes before procedure secondary to polypharmacy and anxiety.  I will touch base with [name] regarding holding her Coumadin for a few days. Also touch base with Dr. Lamonte Sakai regarding any special management necessary regarding her ITP. Patient currently has a normal platelet count therefore likely we'll not need to have IVIg. Other recommendations to follow.

## 2012-12-27 NOTE — Assessment & Plan Note (Signed)
Epigastric pain, possible melena in the setting of chronic prednisone and Coumadin therapy. Needs an upper endoscopy for further evaluation. As above we'll touch base with [name] and Dr. Lamonte Sakai to assist with management of her Coumadin and ITP. Patient has maintained a normal H/H. No ?melena in couple of weeks.  I have discussed the risks, alternatives, benefits with regards to but not limited to the risk of reaction to medication, bleeding, infection, perforation and the patient is agreeable to proceed. Written consent to be obtained.

## 2012-12-28 ENCOUNTER — Other Ambulatory Visit: Payer: BC Managed Care – PPO

## 2013-01-02 ENCOUNTER — Encounter: Payer: Self-pay | Admitting: Oncology

## 2013-01-02 ENCOUNTER — Other Ambulatory Visit: Payer: Self-pay | Admitting: Oncology

## 2013-01-02 ENCOUNTER — Telehealth: Payer: Self-pay | Admitting: Gastroenterology

## 2013-01-02 ENCOUNTER — Encounter (HOSPITAL_COMMUNITY): Payer: BC Managed Care – PPO | Attending: Oncology

## 2013-01-02 DIAGNOSIS — R791 Abnormal coagulation profile: Secondary | ICD-10-CM

## 2013-01-02 DIAGNOSIS — D693 Immune thrombocytopenic purpura: Secondary | ICD-10-CM

## 2013-01-02 LAB — CBC
Hemoglobin: 13.6 g/dL (ref 12.0–15.0)
MCH: 23.1 pg — ABNORMAL LOW (ref 26.0–34.0)
MCHC: 31.4 g/dL (ref 30.0–36.0)
MCV: 73.4 fL — ABNORMAL LOW (ref 78.0–100.0)
Platelets: 107 10*3/uL — ABNORMAL LOW (ref 150–400)

## 2013-01-02 NOTE — Telephone Encounter (Signed)
Received letter from Dr. Lamonte Sakai. Pt is ok to have tcs. Letter is on [facility] desk for review.

## 2013-01-02 NOTE — Telephone Encounter (Signed)
I called and talked with Summer Cook at [facility](who is on call for [place] this week) and said for [place] to call Dr. Lamonte Sakai about her coumadin.

## 2013-01-02 NOTE — Telephone Encounter (Signed)
Tried to call Dr. Nolon Rod office, it rang many times with no answer.

## 2013-01-02 NOTE — Telephone Encounter (Signed)
Tried to call Dr. Agustina Caroli office. LM on nurse's voicemail.

## 2013-01-02 NOTE — Telephone Encounter (Signed)
Please call Dr. Nolon Rod office and find out if we can hold patient's coumadin for few days around time of EGD/TCS.  Patient is on the schedule for 01/05/13!!!!!  Please call Dr. Sherryl Manges (hematologist). Find out if patient requires any special treatment preoperatively/postoperatively for her ITP. EGD/TCS scheduled 01/05/13!!!!! ?patient just need platelet count checked day before procedures or does she need something like IVIg?

## 2013-01-02 NOTE — Progress Notes (Signed)
Labs drawn today for cbc 

## 2013-01-03 NOTE — Telephone Encounter (Signed)
Pt took her last dose of coumadin on Thursday 12/29/12.

## 2013-01-03 NOTE — Telephone Encounter (Signed)
Per Dr. Lamonte Sakai,  [place] for colonoscopy and biopsy as needed later this week from a ITP standpoint. "If she has active GI bleeding as evident by colonoscopy, then she should stop her Coumadin" per Dr. Lamonte Sakai. Otherwise resume as per Dr. Gala Romney after procedure.   Will fax letter to endo to have on record.  Tried to call patient to make sure she has stopped her coumadin. LM on cell phone. No voice mail on her home phone. Ginger and/or Almyra Free to continue to try and call patient.

## 2013-01-04 ENCOUNTER — Other Ambulatory Visit: Payer: BC Managed Care – PPO

## 2013-01-04 ENCOUNTER — Ambulatory Visit (HOSPITAL_COMMUNITY)
Admission: RE | Admit: 2013-01-04 | Discharge: 2013-01-04 | Disposition: A | Discharge status: Home / Self Care | Payer: BC Managed Care – PPO | Source: Ambulatory Visit | Attending: Internal Medicine | Admitting: Internal Medicine

## 2013-01-04 ENCOUNTER — Encounter (HOSPITAL_COMMUNITY): Payer: Self-pay

## 2013-01-04 ENCOUNTER — Encounter (HOSPITAL_COMMUNITY)
Admission: RE | Disposition: A | Discharge status: Home / Self Care | Payer: Self-pay | Source: Ambulatory Visit | Attending: Internal Medicine

## 2013-01-04 DIAGNOSIS — K921 Melena: Secondary | ICD-10-CM | POA: Insufficient documentation

## 2013-01-04 DIAGNOSIS — K649 Unspecified hemorrhoids: Secondary | ICD-10-CM | POA: Insufficient documentation

## 2013-01-04 DIAGNOSIS — R933 Abnormal findings on diagnostic imaging of other parts of digestive tract: Secondary | ICD-10-CM

## 2013-01-04 DIAGNOSIS — R1013 Epigastric pain: Secondary | ICD-10-CM | POA: Insufficient documentation

## 2013-01-04 DIAGNOSIS — Z7901 Long term (current) use of anticoagulants: Secondary | ICD-10-CM | POA: Insufficient documentation

## 2013-01-04 DIAGNOSIS — D126 Benign neoplasm of colon, unspecified: Secondary | ICD-10-CM | POA: Insufficient documentation

## 2013-01-04 DIAGNOSIS — K449 Diaphragmatic hernia without obstruction or gangrene: Secondary | ICD-10-CM | POA: Insufficient documentation

## 2013-01-04 DIAGNOSIS — K625 Hemorrhage of anus and rectum: Secondary | ICD-10-CM

## 2013-01-04 DIAGNOSIS — K319 Disease of stomach and duodenum, unspecified: Secondary | ICD-10-CM | POA: Insufficient documentation

## 2013-01-04 DIAGNOSIS — K648 Other hemorrhoids: Secondary | ICD-10-CM

## 2013-01-04 DIAGNOSIS — K5730 Diverticulosis of large intestine without perforation or abscess without bleeding: Secondary | ICD-10-CM

## 2013-01-04 HISTORY — PX: COLONOSCOPY WITH ESOPHAGOGASTRODUODENOSCOPY (EGD): SHX5779

## 2013-01-04 LAB — KOH PREP

## 2013-01-04 SURGERY — COLONOSCOPY WITH ESOPHAGOGASTRODUODENOSCOPY (EGD)
Anesthesia: Moderate Sedation

## 2013-01-04 MED ORDER — SODIUM CHLORIDE 0.9 % IJ SOLN
INTRAMUSCULAR | Status: AC
  Filled 2013-01-04: qty 10

## 2013-01-04 MED ORDER — ONDANSETRON HCL 4 MG/2ML IJ SOLN
INTRAMUSCULAR | Status: AC
  Filled 2013-01-04: qty 2

## 2013-01-04 MED ORDER — MEPERIDINE HCL 100 MG/ML IJ SOLN
INTRAMUSCULAR | Status: DC | PRN
  Administered 2013-01-04: 25 mg via INTRAVENOUS
  Administered 2013-01-04: 50 mg via INTRAVENOUS
  Administered 2013-01-04: 25 mg via INTRAVENOUS

## 2013-01-04 MED ORDER — MIDAZOLAM HCL 5 MG/5ML IJ SOLN
INTRAMUSCULAR | Status: DC | PRN
  Administered 2013-01-04 (×2): 1 mg via INTRAVENOUS
  Administered 2013-01-04: 2 mg via INTRAVENOUS
  Administered 2013-01-04 (×2): 1 mg via INTRAVENOUS

## 2013-01-04 MED ORDER — PROMETHAZINE HCL 25 MG/ML IJ SOLN
INTRAMUSCULAR | Status: AC
  Filled 2013-01-04: qty 1

## 2013-01-04 MED ORDER — STERILE WATER FOR IRRIGATION IR SOLN
Status: DC | PRN
  Administered 2013-01-04: 12:00:00

## 2013-01-04 MED ORDER — ONDANSETRON HCL 4 MG/2ML IJ SOLN
INTRAMUSCULAR | Status: DC | PRN
  Administered 2013-01-04: 4 mg via INTRAVENOUS

## 2013-01-04 MED ORDER — BUTAMBEN-TETRACAINE-BENZOCAINE 2-2-14 % EX AERO
INHALATION_SPRAY | CUTANEOUS | Status: DC | PRN
  Administered 2013-01-04: 1 via TOPICAL

## 2013-01-04 MED ORDER — SODIUM CHLORIDE 0.9 % IV SOLN
INTRAVENOUS | Status: DC
  Administered 2013-01-04: 1000 mL via INTRAVENOUS

## 2013-01-04 MED ORDER — MIDAZOLAM HCL 5 MG/5ML IJ SOLN
INTRAMUSCULAR | Status: AC
  Filled 2013-01-04: qty 10

## 2013-01-04 MED ORDER — PROMETHAZINE HCL 25 MG/ML IJ SOLN
25.0000 mg | Freq: Once | INTRAMUSCULAR | Status: AC
  Administered 2013-01-04: 25 mg via INTRAVENOUS

## 2013-01-04 MED ORDER — MEPERIDINE HCL 100 MG/ML IJ SOLN
INTRAMUSCULAR | Status: AC
  Filled 2013-01-04: qty 2

## 2013-01-04 NOTE — Interval H&P Note (Signed)
History and Physical Interval Note:  01/04/2013 11:31 AM  Summer Cook  has presented today for surgery, with the diagnosis of MELENA, RECTAL BLEEDING AND EPIGASTRIC PAIN  The various methods of treatment have been discussed with the patient and family. After consideration of risks, benefits and other options for treatment, the patient has consented to  Procedure(s) with comments: COLONOSCOPY WITH ESOPHAGOGASTRODUODENOSCOPY (EGD) (N/A) - 12:45-rescheduled to [address] to notify pt as a surgical intervention .  The patient's history has been reviewed, patient examined, no change in status, stable for surgery.  I have reviewed the patient's chart and labs.  Questions were answered to the patient's satisfaction.     Robert Rourk  Coumadin held x4 days. EGD and colonoscopy per plan.The risks, benefits, limitations, imponderables and alternatives regarding both EGD and colonoscopy have been reviewed with the patient. Questions have been answered. All parties agreeable.

## 2013-01-04 NOTE — H&P (View-Only) (Signed)
Primary Care Physician:  Glo Herring., MD  Primary Gastroenterologist:  Garfield Cornea, MD   Chief Complaint  Patient presents with  . Rectal Bleeding  . Hemorrhoids  . Abdominal Pain    HPI:  Summer Cook is a 47 y.o. female here for further evaluation of rectal bleeding, abdominal pain, questionable melena. She was referred by [name] for further evaluation of these symptoms.  The patient has a history of ITP, currently managed by Dr. Lamonte Sakai. Patient currently on prednisone taper, CellCept. 2 weeks ago her platelet house 42,000, currently it is 336,000. Patient is also on Coumadin, she had a pulmonary embolus in August of 2013 (unclear etiology). She had hypercoagulable workup but she's not aware of the findings.   Patient complains rectal bleeding noted with every bowel movement.Symptoms for over one year. BRBPR, increased amount lately. Initially thought it was just hemorrhoids. One month ago, several stools were very dark in color, no Pepto-Bismol. Also with lots of fresh blood noted. Happened two to three times. Complains of constipation. Both paternal grandparents had colon cancer, GF age 35 when passed, GM age 72. Nausea intermittent. Heartburn frequent. No dysphagia. Epigastric discomfort, feels bloated and tight. BM QOD when on prednisone. Other times may skip 3 days. Straining most of the time. Abdominal cramping associated with it. No prior EGD/TCS.   Patient reports that she stopped most of her medications back around December because she just wasn't feeling well. She felt it may be a side effect of her medications. She also reports that she had a lapse in her insurance which led her to not be able to afford some of her medications. She was actually off her Coumadin up until about one month ago. Coumadin is managed by [name].    Current Outpatient Prescriptions  Medication Sig Dispense Refill  . ALPRAZolam (XANAX) 1 MG tablet Take 1 mg by mouth 3 (three) times daily as  needed for anxiety.       Marland Kitchen escitalopram (LEXAPRO) 10 MG tablet Take 10 mg by mouth daily.      Marland Kitchen HYDROcodone-acetaminophen (NORCO/VICODIN) 5-325 MG per tablet Take 1 tablet by mouth every 6 (six) hours as needed for pain.      . predniSONE (DELTASONE) 10 MG tablet Take 30 mg by mouth daily.       Marland Kitchen triamterene-hydrochlorothiazide (DYAZIDE) 37.5-25 MG per capsule Take 1 capsule by mouth daily.      . vitamin B-12 (CYANOCOBALAMIN) 1000 MCG tablet Take 2,000 mcg by mouth 2 (two) times daily.       Marland Kitchen warfarin (COUMADIN) 1 MG tablet Take 6 mg by mouth every evening.       Marland Kitchen albuterol (PROVENTIL HFA;VENTOLIN HFA) 108 (90 BASE) MCG/ACT inhaler Inhale 2 puffs into the lungs every 6 (six) hours as needed for wheezing.      . Cholecalciferol (VITAMIN D) 2000 UNITS CAPS Take 1 capsule by mouth daily.      . mycophenolate (CELLCEPT) 500 MG tablet Take 500 mg by mouth 2 (two) times daily.      . polyethylene glycol-electrolytes (TRILYTE) 420 G solution Take 4,000 mLs by mouth as directed.  4000 mL  0  . [DISCONTINUED] prochlorperazine (COMPAZINE) 10 MG tablet Take 10 mg by mouth as needed. For nausea        No current facility-administered medications for this visit.    Allergies as of 12/26/2012 - Review Complete 12/26/2012  Allergen Reaction Noted  . Yellow jacket venom Swelling 08/19/2011  Past Medical History  Diagnosis Date  . Essential hypertension, benign   . 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  Last was Prednisone 40mg ; 2 wk in 10/2012.  She also was on Prednisone bridged with Cellcept briefly but stopped due to lack of insurance.   . Depression   . Thrush, oral 05/29/2011  . Steroid-induced diabetes   . Pulmonary embolism 04/2012  . Asthma   . Renal insufficiency     Past Surgical History  Procedure Laterality Date  . Cholecystectomy  2008  . Bone marrow biopsy    .  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 Cervical cancer Mother   . Lung cancer Father   . Pneumonia Brother   . Cancer Paternal Grandmother 31    colon cancer, breast cancer  . Cancer Paternal Grandfather 57    colon cancer  . AAA (abdominal aortic aneurysm) Paternal Grandmother     History   Social History  . Marital Status: Divorced    Spouse Name: N/A    Number of Children: 1  . Years of Education: N/A   Occupational History  .      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    Social History Main Topics  . Smoking status: Former Smoker    Types: Cigarettes    Quit date: 02/15/2011  . Smokeless tobacco: Never Used  . Alcohol Use: No  . Drug Use: No  . Sexually Active: No   Other Topics Concern  . Not on file   Social History Narrative  . No narrative on file      ROS:  General: Negative for anorexia, weight loss, fever, chills, fatigue, weakness. Eyes: Negative for vision changes.  ENT: Negative for hoarseness, difficulty swallowing , nasal congestion. CV: Negative for chest pain, angina, palpitations, dyspnea on exertion, peripheral edema.  Respiratory: Negative for dyspnea at rest, dyspnea on exertion, cough, sputum, wheezing.  GI: See history of present illness. GU:  Negative for dysuria, hematuria, urinary incontinence, urinary frequency, nocturnal urination.  MS: Negative for joint pain, low back pain.  Derm: Negative for rash or itching.  Neuro: Negative for weakness, abnormal sensation, seizure, frequent headaches, memory loss, confusion.  Psych: complains of anxiety and depression but no suicidal ideation, hallucinations.  Endo: Negative for unusual weight change.  Heme: Negative for bruising or bleeding. Allergy: Negative for rash or hives.    Physical Examination:  BP 140/85  Pulse 75  Temp(Src) 97.5 F (36.4 C) (Oral)  Ht 5\' 5"  (1.651 m)  Wt 215 lb 12.8 oz (97.886 kg)  BMI 35.91 kg/m2   General:  Well-nourished, well-developed in no acute distress.  Head: Normocephalic, atraumatic.   Eyes: Conjunctiva pink, no icterus. Mouth: Oropharyngeal mucosa moist and pink , no lesions erythema or exudate. Neck: Supple without thyromegaly, masses, or lymphadenopathy.  Lungs: Clear to auscultation bilaterally.  Heart: Regular rate and rhythm, no murmurs rubs or gallops.  Abdomen: Bowel sounds are normal, moderate epigastric tenderness, nondistended, no hepatosplenomegaly or masses, no abdominal bruits or    hernia , no rebound or guarding.   Rectal: not performed Extremities: No lower extremity edema. No clubbing or deformities.  Neuro: Alert and oriented x 4 , grossly normal neurologically.  Skin: Warm and dry, no rash or jaundice.   Psych: Alert and cooperative, normal mood and affect.  Labs: Lab Results  Component Value Date   WBC 11.7* 12/26/2012   HGB 12.6 12/26/2012   HCT 40.2 12/26/2012   MCV 73.9* 12/26/2012   PLT 336 12/26/2012   Lab Results  Component Value Date   ALT 33 12/07/2012   AST 26 12/07/2012   ALKPHOS 100 12/07/2012   BILITOT 0.65 12/07/2012   Lab Results  Component Value Date   CREATININE 1.5* 12/07/2012   BUN 23.1 12/07/2012   NA 140 12/07/2012   K 3.2* 12/07/2012   CL 104 12/07/2012   CO2 24 12/07/2012

## 2013-01-04 NOTE — Op Note (Addendum)
[facility] [address] [place], [address]   COLONOSCOPY PROCEDURE REPORT  PATIENT: Summer, Cook  MR#:         [id number] BIRTHDATE: [birth year] , 28  yrs. old GENDER: Female ENDOSCOPIST: R.  Garfield Cornea, MD FACP FACG REFERRED BY:  Sherryl Manges, M.D.  Kerin Perna, M.D. PROCEDURE DATE:  01/04/2013 PROCEDURE:     Ileocolonoscopy with biopsy  INDICATIONS: Hematochezia  INFORMED CONSENT:  The risks, benefits, alternatives and imponderables including but not limited to bleeding, perforation as well as the possibility of a missed lesion have been reviewed.  The potential for biopsy, lesion removal, etc. have also been discussed.  Questions have been answered.  All parties agreeable. Please see the history and physical in the medical record for more information.  MEDICATIONS: Versed 6 mg IV and Demerol 75 mg IV in divided doses. Phenergan 25 mg IV and Zofran 4 mg the  DESCRIPTION OF PROCEDURE:  After a digital rectal exam was performed, the [id number]  colonoscope was advanced from the anus through the rectum and colon to the area of the cecum, ileocecal valve and appendiceal orifice.  The cecum was deeply intubated.  These structures were well-seen and photographed for the record.  From the level of the cecum and ileocecal valve, the scope was slowly and cautiously withdrawn.  The mucosal surfaces were carefully surveyed utilizing scope tip deflection to facilitate fold flattening as needed.  The scope was pulled down into the rectum where a thorough examination including retroflexion was performed.    FINDINGS:  Adequate preparation. Anal canal hemorrhoids; otherwise, normal rectum. Rare sigmoid diverticula; (1)mid descending diminutive polyps; otherwise, the remainder of colonic mucosa appeared normal. The distal 5 cm of terminal ileal mucosa also appeared normal.  THERAPEUTIC / DIAGNOSTIC MANEUVERS PERFORMED:  The above-mentioned polyps cold  biopsied/removed.  COMPLICATIONS: None  CECAL WITHDRAWAL TIME:  8 minutes  IMPRESSION:  Anal canal hemorrhoids-likely source of hematochezia. Colonic diverticulosis. Single small colonic polyp-removed as described above.  RECOMMENDATIONS: Benefiber 2 tablespoons daily. Ten-day course of Anusol suppositories as directed. Utilize MiraLax daily as needed when necessary constipation  Followup on pathology. See EGD report.. Resume Coumadin today.   _______________________________ eSigned:  R. Garfield Cornea, MD Rosalita Chessman [facility] 01/04/2013 12:41 PM Revised: 01/04/2013 12:41 PM  CC:

## 2013-01-04 NOTE — Op Note (Signed)
[facility] [address] [place], [address]   ENDOSCOPY PROCEDURE REPORT  PATIENT: Summer, Cook  MR#: [id number] BIRTHDATE: 1965-11-02 , 33  yrs. old GENDER: Female ENDOSCOPIST: R.  Garfield Cornea, MD FACP FACG REFERRED BY:  Sherryl Manges, M.D.  Kerin Perna, M.D. PROCEDURE DATE:  01/04/2013 PROCEDURE:     EGD with KOH esophageal brushing and gastric biopsy  INDICATIONS:    Epigastric pain  INFORMED CONSENT:   The risks, benefits, limitations, alternatives and imponderables have been discussed.  The potential for biopsy, esophogeal dilation, etc. have also been reviewed.  Questions have been answered.  All parties agreeable.  Please see the history and physical in the medical record for more information.  MEDICATIONS:    Versed 4 mg IV and Demerol 75 mg IV in divided doses. Cetacaine spray Zofran 4 mg IV  DESCRIPTION OF PROCEDURE:   The [id number]  endoscope was introduced through the mouth and advanced to the second portion of the duodenum without difficulty or limitations.  The mucosal surfaces were surveyed very carefully during advancement of the scope and upon withdrawal.  Retroflexion view of the proximal stomach and esophagogastric junction was performed.      FINDINGS:  Whitish plaques in a linear distribution throughout the proximal and mid esophageal body. They were tenacious and would not easily wash off. Otherwise, the remainder soft mucosa appeared normal. Stomach empty. 2 cm hiatal hernia. Nodular appearing antrum with a superimposed erosions. No ulcer or infiltrating process seen. Patent pylorus. Normal first and second portion of the duodenum  THERAPEUTIC / DIAGNOSTIC MANEUVERS PERFORMED:  Esophageal plaques were brushed for KOH prep. Subsequently, the abnormal antrum was biopsied for histologic study.   COMPLICATIONS:  None  IMPRESSION:   Esophageal plaques of uncertain significance-status post KOH brushing for Candida; hiatal  hernia.  Abnormal gastric antrum as described above-status post biopsy.  RECOMMENDATIONS: Follow up on pending studies. See colonoscopy report.    _______________________________ R. Garfield Cornea, MD FACP [facility] eSigned:  R. Garfield Cornea, MD FACP [facility] 01/04/2013 12:01 PM     CC:

## 2013-01-08 ENCOUNTER — Encounter: Payer: Self-pay | Admitting: Internal Medicine

## 2013-01-09 ENCOUNTER — Encounter (HOSPITAL_COMMUNITY): Payer: Self-pay | Admitting: Internal Medicine

## 2013-01-09 ENCOUNTER — Other Ambulatory Visit (HOSPITAL_COMMUNITY): Payer: BC Managed Care – PPO

## 2013-01-10 ENCOUNTER — Encounter (HOSPITAL_BASED_OUTPATIENT_CLINIC_OR_DEPARTMENT_OTHER): Payer: BC Managed Care – PPO

## 2013-01-10 DIAGNOSIS — D693 Immune thrombocytopenic purpura: Secondary | ICD-10-CM

## 2013-01-10 LAB — CBC
HCT: 41.9 % (ref 36.0–46.0)
RBC: 5.68 MIL/uL — ABNORMAL HIGH (ref 3.87–5.11)
RDW: 19.9 % — ABNORMAL HIGH (ref 11.5–15.5)
WBC: 7.1 10*3/uL (ref 4.0–10.5)

## 2013-01-10 NOTE — Progress Notes (Signed)
Summer Cook presented for labwork. Labs per MD order drawn via Peripheral Line 25 gauge needle inserted in rt ac.  Good blood return present. Procedure without incident.  Needle removed intact. Patient tolerated procedure well.

## 2013-01-11 ENCOUNTER — Other Ambulatory Visit: Payer: BC Managed Care – PPO

## 2013-01-11 ENCOUNTER — Telehealth: Payer: Self-pay | Admitting: *Deleted

## 2013-01-11 NOTE — Telephone Encounter (Signed)
Message copied by Maudie Mercury on Wed Jan 11, 2013 11:57 AM ------      Message from: HA, Trudee Grip T      Created: Wed Jan 11, 2013 11:16 AM       Please call patient and advise her to decrease the dose of prednisone by 10 mg and continue CellCept at current dose. ------

## 2013-01-11 NOTE — Telephone Encounter (Signed)
Informed pt of Platelet count and instructions to decrease Prednisone by 10 mg daily.  She states currently taking 40 mg daily and will decrease to 30 mg daily.  She also reports she increased prednisone dose herself last week on Tues and Wed to 50 mg as she was having a Colonoscopy on Tues and was worried about her Platelets being too low.  She resumed the 40 mg on Thursday.  Pt will continue to take same dose Cellcept, 500 mg in am and 1,000 mg in the pm.  Keep weekly labs as scheduled.  Pt says she heard Dr. Lamonte Sakai is leaving the practice and she requests to be transferred to Dr. Beryle Beams after he leaves.  Will inform Management of pt's request.

## 2013-01-16 ENCOUNTER — Telehealth: Payer: Self-pay | Admitting: *Deleted

## 2013-01-16 ENCOUNTER — Encounter (HOSPITAL_BASED_OUTPATIENT_CLINIC_OR_DEPARTMENT_OTHER): Payer: BC Managed Care – PPO

## 2013-01-16 ENCOUNTER — Telehealth (HOSPITAL_COMMUNITY): Payer: Self-pay | Admitting: Oncology

## 2013-01-16 ENCOUNTER — Other Ambulatory Visit: Payer: Self-pay | Admitting: Oncology

## 2013-01-16 ENCOUNTER — Encounter: Payer: Self-pay | Admitting: Oncology

## 2013-01-16 DIAGNOSIS — D693 Immune thrombocytopenic purpura: Secondary | ICD-10-CM

## 2013-01-16 LAB — CBC
HCT: 46.6 % — ABNORMAL HIGH (ref 36.0–46.0)
Hemoglobin: 15 g/dL (ref 12.0–15.0)
MCH: 23.6 pg — ABNORMAL LOW (ref 26.0–34.0)
RBC: 6.36 MIL/uL — ABNORMAL HIGH (ref 3.87–5.11)

## 2013-01-16 NOTE — Progress Notes (Signed)
Labs drawn cbc

## 2013-01-16 NOTE — Telephone Encounter (Signed)
CRITICAL VALUE ALERT Critical value received:  PLT 18k Date of notification:  01/16/13 Time of notification: [id number] Critical value read back:  yes Nurse who received alert: Claude Manges, RN MD notified (1st page):  Dr. Lamonte Sakai paged at 1057  01/16/13 1058  Dr. Lamonte Sakai returned page and informed of pt's platelets - no further follow-up given to me at this time.

## 2013-01-16 NOTE — Telephone Encounter (Signed)
Spoke with patient, explained that her plt count is 18K and dr ha wants her to increase her prednisone to 50mg  daily, and increase cellcept to 1,000 mg PO BID. Patient verbalized understanding.

## 2013-01-16 NOTE — Telephone Encounter (Signed)
Message copied by Randolm Idol on Mon Jan 16, 2013  1:31 PM ------      Message from: Aliene Altes C      Created: Mon Jan 16, 2013 12:07 PM      Regarding: FW: Medication adjustment                   ----- Message -----         From: Nobie Putnam, MD         Sent: 01/16/2013  11:02 AM           To: Aleene Davidson, RN      Subject: Medication adjustment                                    Please call pt and inform her that her plt today was only 18K.  Please advise her to            1.  Increase Prednisone back up to 50mg  PO daily.      2.  Increase Cellcept to 1,000mg  PO BID.              Thanks.              ------

## 2013-01-18 ENCOUNTER — Encounter (HOSPITAL_COMMUNITY): Payer: Self-pay

## 2013-01-18 ENCOUNTER — Other Ambulatory Visit: Payer: BC Managed Care – PPO

## 2013-01-18 ENCOUNTER — Telehealth: Payer: Self-pay | Admitting: Internal Medicine

## 2013-01-18 ENCOUNTER — Emergency Department (HOSPITAL_COMMUNITY)
Admission: EM | Admit: 2013-01-18 | Discharge: 2013-01-18 | Disposition: A | Discharge status: Home / Self Care | Payer: BC Managed Care – PPO | Attending: Emergency Medicine | Admitting: Emergency Medicine

## 2013-01-18 DIAGNOSIS — IMO0002 Reserved for concepts with insufficient information to code with codable children: Secondary | ICD-10-CM | POA: Insufficient documentation

## 2013-01-18 DIAGNOSIS — D693 Immune thrombocytopenic purpura: Secondary | ICD-10-CM | POA: Insufficient documentation

## 2013-01-18 DIAGNOSIS — Z9889 Other specified postprocedural states: Secondary | ICD-10-CM | POA: Insufficient documentation

## 2013-01-18 DIAGNOSIS — F329 Major depressive disorder, single episode, unspecified: Secondary | ICD-10-CM | POA: Insufficient documentation

## 2013-01-18 DIAGNOSIS — R197 Diarrhea, unspecified: Secondary | ICD-10-CM | POA: Insufficient documentation

## 2013-01-18 DIAGNOSIS — J45909 Unspecified asthma, uncomplicated: Secondary | ICD-10-CM | POA: Insufficient documentation

## 2013-01-18 DIAGNOSIS — K5289 Other specified noninfective gastroenteritis and colitis: Secondary | ICD-10-CM | POA: Insufficient documentation

## 2013-01-18 DIAGNOSIS — Z79899 Other long term (current) drug therapy: Secondary | ICD-10-CM | POA: Insufficient documentation

## 2013-01-18 DIAGNOSIS — Z8619 Personal history of other infectious and parasitic diseases: Secondary | ICD-10-CM | POA: Insufficient documentation

## 2013-01-18 DIAGNOSIS — Z7901 Long term (current) use of anticoagulants: Secondary | ICD-10-CM | POA: Insufficient documentation

## 2013-01-18 DIAGNOSIS — Z87891 Personal history of nicotine dependence: Secondary | ICD-10-CM | POA: Insufficient documentation

## 2013-01-18 DIAGNOSIS — Z9089 Acquired absence of other organs: Secondary | ICD-10-CM | POA: Insufficient documentation

## 2013-01-18 DIAGNOSIS — Z87448 Personal history of other diseases of urinary system: Secondary | ICD-10-CM | POA: Insufficient documentation

## 2013-01-18 DIAGNOSIS — Z862 Personal history of diseases of the blood and blood-forming organs and certain disorders involving the immune mechanism: Secondary | ICD-10-CM | POA: Insufficient documentation

## 2013-01-18 DIAGNOSIS — K529 Noninfective gastroenteritis and colitis, unspecified: Secondary | ICD-10-CM

## 2013-01-18 DIAGNOSIS — F3289 Other specified depressive episodes: Secondary | ICD-10-CM | POA: Insufficient documentation

## 2013-01-18 DIAGNOSIS — I10 Essential (primary) hypertension: Secondary | ICD-10-CM | POA: Insufficient documentation

## 2013-01-18 DIAGNOSIS — Z8639 Personal history of other endocrine, nutritional and metabolic disease: Secondary | ICD-10-CM | POA: Insufficient documentation

## 2013-01-18 DIAGNOSIS — Z86711 Personal history of pulmonary embolism: Secondary | ICD-10-CM | POA: Insufficient documentation

## 2013-01-18 LAB — CBC WITH DIFFERENTIAL/PLATELET
Eosinophils Absolute: 0 10*3/uL (ref 0.0–0.7)
Eosinophils Relative: 0 % (ref 0–5)
Hemoglobin: 15 g/dL (ref 12.0–15.0)
Lymphs Abs: 1.2 10*3/uL (ref 0.7–4.0)
MCH: 23.6 pg — ABNORMAL LOW (ref 26.0–34.0)
MCV: 72.1 fL — ABNORMAL LOW (ref 78.0–100.0)
Monocytes Absolute: 0.9 10*3/uL (ref 0.1–1.0)
Monocytes Relative: 7 % (ref 3–12)
RBC: 6.35 MIL/uL — ABNORMAL HIGH (ref 3.87–5.11)

## 2013-01-18 LAB — BASIC METABOLIC PANEL
CO2: 25 mEq/L (ref 19–32)
Glucose, Bld: 132 mg/dL — ABNORMAL HIGH (ref 70–99)
Potassium: 3.4 mEq/L — ABNORMAL LOW (ref 3.5–5.1)
Sodium: 131 mEq/L — ABNORMAL LOW (ref 135–145)

## 2013-01-18 MED ORDER — SODIUM CHLORIDE 0.9 % IV BOLUS (SEPSIS)
1000.0000 mL | Freq: Once | INTRAVENOUS | Status: AC
  Administered 2013-01-18: 1000 mL via INTRAVENOUS

## 2013-01-18 MED ORDER — ONDANSETRON HCL 8 MG PO TABS: 8.0000 mg | ORAL_TABLET | ORAL | Status: DC | PRN

## 2013-01-18 MED ORDER — ONDANSETRON HCL 4 MG/2ML IJ SOLN: 4.0000 mg | Freq: Once | INTRAMUSCULAR | Status: AC

## 2013-01-18 MED ORDER — DIPHENOXYLATE-ATROPINE 2.5-0.025 MG PO TABS: 1.0000 | ORAL_TABLET | Freq: Four times a day (QID) | ORAL | Status: DC | PRN

## 2013-01-18 MED ORDER — ONDANSETRON HCL 4 MG/2ML IJ SOLN
INTRAMUSCULAR | Status: AC
  Administered 2013-01-18: 4 mg via INTRAVENOUS
  Filled 2013-01-18: qty 2

## 2013-01-18 MED ORDER — PANTOPRAZOLE SODIUM 40 MG IV SOLR
40.0000 mg | Freq: Once | INTRAVENOUS | Status: AC
  Administered 2013-01-18: 40 mg via INTRAVENOUS
  Filled 2013-01-18: qty 40

## 2013-01-18 MED ORDER — ONDANSETRON HCL 4 MG/2ML IJ SOLN: 4.0000 mg | Freq: Once | INTRAMUSCULAR | Status: DC

## 2013-01-18 NOTE — Telephone Encounter (Signed)
Since her procedure on 04/09 anything she eats or drinks she vomits and has diarrhea, she feels weak and dehydrated and shes unsure if she should go to ER she dont feel like sitting there for long periods of time, please advise?

## 2013-01-18 NOTE — ED Notes (Signed)
Pt reports had colonoscopy and endoscopy Tuesday.  Reports started having vomiting and diarrhea since Thursday.

## 2013-01-18 NOTE — Telephone Encounter (Signed)
Agree with ED evaluation. 

## 2013-01-18 NOTE — ED Notes (Signed)
Pt c/o severe nausea, notified edp

## 2013-01-18 NOTE — ED Notes (Signed)
Pt given warm blanket and meds for nausea as ordered by md.  Pt resting in bed.

## 2013-01-18 NOTE — ED Provider Notes (Signed)
History  This chart was scribed for [name], MD by Jenne Campus, ED Scribe. This patient was seen in room APA03/APA03 and the patient's care was started at 11:36 AM.  CSN: [id number]  Arrival date & time 01/18/13  1042   First MD Initiated Contact with Patient 01/18/13 1136      Chief Complaint  Patient presents with  . Emesis  . Diarrhea     The history is provided by the patient. No language interpreter was used.    Summer Cook is a 47 y.o. female who presents to the Emergency Department complaining of 6 days of non-changing, constant emesis with associated diarrhea. She reports having 2 episodes of emesis and between 10 and 20 episodes of diarrhea in the past 24 hours. She admits that she had a colonoscopy and endoscopy 5 days ago by Dr. Sydell Axon for problems with rectal bleeding with no findings. She denies hematochezia, hematemesis, abdominal pain, fevers and chills as associated symptoms. She has a h/o ITP currently being treated with prednisone and cellcept and states that her platelets were 18,000 6 days ago. She also has a h/o depression, PE and HTN. Pt is a former smoker but denies alcohol use.  Dr. Lamonte Sakai is ITP doctor PCP is [name] Dr. Sydell Axon is GI specialist   Past Medical History  Diagnosis Date  . Essential hypertension, benign   . 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  Last was Prednisone 40mg ; 2 wk in 10/2012.  She also was on Prednisone bridged with Cellcept briefly but stopped due to lack of insurance.   . Depression   . Thrush, oral 05/29/2011  . Steroid-induced diabetes   . Pulmonary embolism 04/2012  . Asthma   . Renal insufficiency     Past Surgical History  Procedure Laterality Date  . Cholecystectomy  2008  . Bone marrow biopsy    . 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 Colonoscopy with esophagogastroduodenoscopy (egd) N/A 01/04/2013     Procedure: COLONOSCOPY WITH ESOPHAGOGASTRODUODENOSCOPY (EGD);  Surgeon: Daneil Dolin, MD;  [facility];  Service: Endoscopy;  Laterality: N/A;  12:45-rescheduled to 1115 Darius Bump to notify pt    Family History  Problem Relation Age of Onset  . Cervical cancer Mother   . Lung cancer Father   . Pneumonia Brother   . Cancer Paternal Grandmother 65    colon cancer, breast cancer  . Cancer Paternal Grandfather 51    colon cancer  . AAA (abdominal aortic aneurysm) Paternal Grandmother     History  Substance Use Topics  . Smoking status: Former Smoker    Types: Cigarettes    Quit date: 02/15/2011  . Smokeless tobacco: Never Used  . Alcohol Use: No    No OB history provided.  Review of Systems  A complete 10 system review of systems was obtained and all systems are negative except as noted in the HPI and PMH.   Allergies  Yellow jacket venom  Home Medications   Current Outpatient Rx  Name  Route  Sig  Dispense  Refill  . albuterol (PROVENTIL HFA;VENTOLIN HFA) 108 (90 BASE) MCG/ACT inhaler   Inhalation   Inhale 2 puffs into the lungs every 6 (six) hours as needed for wheezing.         Marland Kitchen ALPRAZolam (XANAX) 1 MG tablet   Oral   Take 1 mg by mouth 3 (three) times daily as  needed for anxiety.          . Cholecalciferol (VITAMIN D) 2000 UNITS CAPS   Oral   Take 1 capsule by mouth daily.         Marland Kitchen escitalopram (LEXAPRO) 20 MG tablet   Oral   Take 20 mg by mouth daily.         Marland Kitchen HYDROcodone-acetaminophen (NORCO/VICODIN) 5-325 MG per tablet   Oral   Take 1 tablet by mouth every 6 (six) hours as needed for pain.         . mycophenolate (CELLCEPT) 500 MG tablet   Oral   Take 500-1,000 mg by mouth 2 (two) times daily. One in the morning and 2 in the evening         . predniSONE (DELTASONE) 10 MG tablet   Oral   Take 50 mg by mouth daily.          Marland Kitchen triamterene-hydrochlorothiazide (DYAZIDE) 37.5-25 MG per capsule   Oral   Take 1 capsule by  mouth daily.         . vitamin B-12 (CYANOCOBALAMIN) 1000 MCG tablet   Oral   Take 2,000 mcg by mouth 2 (two) times daily.          Marland Kitchen warfarin (COUMADIN) 3 MG tablet   Oral   Take 3 mg by mouth daily.           Triage Vitals: BP 114/93  Pulse 130  Temp(Src) 98 F (36.7 C) (Oral)  Resp 20  Ht 5\' 5"  (1.651 m)  Wt 204 lb (92.534 kg)  BMI 33.95 kg/m2  SpO2 96%  Physical Exam  Nursing note and vitals reviewed. Constitutional: She is oriented to person, place, and time. She appears well-developed and well-nourished. No distress.  obese  HENT:  Head: Normocephalic and atraumatic.  Dry MM  Eyes: Conjunctivae and EOM are normal.  Neck: Neck supple. No tracheal deviation present.  Cardiovascular: Normal rate and regular rhythm.   Pulmonary/Chest: Effort normal and breath sounds normal. No respiratory distress.  Abdominal: Soft. There is no tenderness.  Musculoskeletal: Normal range of motion. She exhibits no edema.  Neurological: She is alert and oriented to person, place, and time.  Skin: Skin is warm and dry.  Psychiatric: She has a normal mood and affect. Her behavior is normal.    ED Course  Procedures (including critical care time)  DIAGNOSTIC STUDIES: Oxygen Saturation is 96% on room air, normal by my interpretation.    COORDINATION OF CARE: 12:04 PM-Discussed treatment plan which includes IV fluids, protonix and antiemetic with pt at bedside and pt agreed to plan.   Labs Reviewed  BASIC METABOLIC PANEL - Abnormal; Notable for the following:    Sodium 131 (*)    Potassium 3.4 (*)    Chloride 92 (*)    Glucose, Bld 132 (*)    BUN 35 (*)    Creatinine, Ser 1.58 (*)    GFR calc non Af Amer 38 (*)    GFR calc Af Amer 44 (*)    All other components within normal limits  CBC WITH DIFFERENTIAL - Abnormal; Notable for the following:    WBC 12.9 (*)    RBC 6.35 (*)    MCV 72.1 (*)    MCH 23.6 (*)    RDW 19.5 (*)    Platelets 104 (*)    Neutrophils Relative  84 (*)    Neutro Abs 10.8 (*)    Lymphocytes Relative 10 (*)  All other components within normal limits   No results found.   No diagnosis found.    MDM  Patient feeling much better after IV fluids, IV Zofran, IV Protonix. Discharge meds Lomotil and Zofran. Platelet count 104  I personally performed the services described in this documentation, which was scribed in my presence. The recorded information has been reviewed and is accurate.        [name], MD 01/18/13 1538

## 2013-01-18 NOTE — ED Notes (Signed)
Patient given ice chip per MD approval.

## 2013-01-18 NOTE — Telephone Encounter (Signed)
Spoke with pt- she stated she has had vomiting and diarrhea since last Thursday (01/12/13). Yesterday morning she was able to keep down a piece of dry toast. Last night she has a couple of bites of macaroni and cheese. Pt got up this morning and got a something to drink, took one swallow and vomited everything she had last night. Pt stated she feels worse than she has in a long time. She had bloodwork done by Dr. Lamonte Sakai this week and she stated her platelets are down again. She hasnt tried anything like phenergan or zofran. By the time the patient got done telling me what was going on and I offered to ask LSL about her, she stated she was feeling so bad and she was concerned about her platelets, she wants to just go to the ED and be evaluated. Pt stated she was getting ready now and heading to the ED.   Will also send this to [facility] for an FYI since RMR is out today

## 2013-01-18 NOTE — Telephone Encounter (Signed)
REVIEWED.  

## 2013-01-21 ENCOUNTER — Emergency Department (HOSPITAL_COMMUNITY): Payer: BC Managed Care – PPO

## 2013-01-21 ENCOUNTER — Encounter (HOSPITAL_COMMUNITY): Payer: Self-pay | Admitting: *Deleted

## 2013-01-21 ENCOUNTER — Emergency Department (HOSPITAL_COMMUNITY)
Admission: EM | Admit: 2013-01-21 | Discharge: 2013-01-21 | Disposition: A | Discharge status: Home / Self Care | Payer: BC Managed Care – PPO | Attending: Emergency Medicine | Admitting: Emergency Medicine

## 2013-01-21 DIAGNOSIS — Z87891 Personal history of nicotine dependence: Secondary | ICD-10-CM | POA: Insufficient documentation

## 2013-01-21 DIAGNOSIS — R197 Diarrhea, unspecified: Secondary | ICD-10-CM | POA: Insufficient documentation

## 2013-01-21 DIAGNOSIS — R51 Headache: Secondary | ICD-10-CM | POA: Insufficient documentation

## 2013-01-21 DIAGNOSIS — Z86711 Personal history of pulmonary embolism: Secondary | ICD-10-CM | POA: Insufficient documentation

## 2013-01-21 DIAGNOSIS — Z7901 Long term (current) use of anticoagulants: Secondary | ICD-10-CM | POA: Insufficient documentation

## 2013-01-21 DIAGNOSIS — Z9089 Acquired absence of other organs: Secondary | ICD-10-CM | POA: Insufficient documentation

## 2013-01-21 DIAGNOSIS — T380X5A Adverse effect of glucocorticoids and synthetic analogues, initial encounter: Secondary | ICD-10-CM | POA: Insufficient documentation

## 2013-01-21 DIAGNOSIS — J45909 Unspecified asthma, uncomplicated: Secondary | ICD-10-CM | POA: Insufficient documentation

## 2013-01-21 DIAGNOSIS — F3289 Other specified depressive episodes: Secondary | ICD-10-CM | POA: Insufficient documentation

## 2013-01-21 DIAGNOSIS — R112 Nausea with vomiting, unspecified: Secondary | ICD-10-CM

## 2013-01-21 DIAGNOSIS — R079 Chest pain, unspecified: Secondary | ICD-10-CM | POA: Insufficient documentation

## 2013-01-21 DIAGNOSIS — IMO0002 Reserved for concepts with insufficient information to code with codable children: Secondary | ICD-10-CM | POA: Insufficient documentation

## 2013-01-21 DIAGNOSIS — I10 Essential (primary) hypertension: Secondary | ICD-10-CM | POA: Insufficient documentation

## 2013-01-21 DIAGNOSIS — Z79899 Other long term (current) drug therapy: Secondary | ICD-10-CM | POA: Insufficient documentation

## 2013-01-21 DIAGNOSIS — Z8619 Personal history of other infectious and parasitic diseases: Secondary | ICD-10-CM | POA: Insufficient documentation

## 2013-01-21 DIAGNOSIS — R109 Unspecified abdominal pain: Secondary | ICD-10-CM | POA: Insufficient documentation

## 2013-01-21 DIAGNOSIS — F329 Major depressive disorder, single episode, unspecified: Secondary | ICD-10-CM | POA: Insufficient documentation

## 2013-01-21 DIAGNOSIS — F29 Unspecified psychosis not due to a substance or known physiological condition: Secondary | ICD-10-CM | POA: Insufficient documentation

## 2013-01-21 DIAGNOSIS — H538 Other visual disturbances: Secondary | ICD-10-CM | POA: Insufficient documentation

## 2013-01-21 DIAGNOSIS — Z87448 Personal history of other diseases of urinary system: Secondary | ICD-10-CM | POA: Insufficient documentation

## 2013-01-21 DIAGNOSIS — Z9889 Other specified postprocedural states: Secondary | ICD-10-CM | POA: Insufficient documentation

## 2013-01-21 DIAGNOSIS — E139 Other specified diabetes mellitus without complications: Secondary | ICD-10-CM | POA: Insufficient documentation

## 2013-01-21 DIAGNOSIS — D693 Immune thrombocytopenic purpura: Secondary | ICD-10-CM | POA: Insufficient documentation

## 2013-01-21 LAB — CBC WITH DIFFERENTIAL/PLATELET
Basophils Absolute: 0 10*3/uL (ref 0.0–0.1)
Eosinophils Absolute: 0 10*3/uL (ref 0.0–0.7)
Eosinophils Relative: 0 % (ref 0–5)
Lymphs Abs: 0.4 10*3/uL — ABNORMAL LOW (ref 0.7–4.0)
MCH: 23.5 pg — ABNORMAL LOW (ref 26.0–34.0)
MCV: 73.2 fL — ABNORMAL LOW (ref 78.0–100.0)
Platelets: 201 10*3/uL (ref 150–400)
RDW: 19.3 % — ABNORMAL HIGH (ref 11.5–15.5)

## 2013-01-21 LAB — BASIC METABOLIC PANEL
CO2: 27 mEq/L (ref 19–32)
Calcium: 8.6 mg/dL (ref 8.4–10.5)
Creatinine, Ser: 1.5 mg/dL — ABNORMAL HIGH (ref 0.50–1.10)

## 2013-01-21 LAB — HEPATIC FUNCTION PANEL
Alkaline Phosphatase: 49 U/L (ref 39–117)
Indirect Bilirubin: 0.3 mg/dL (ref 0.3–0.9)
Total Protein: 6.6 g/dL (ref 6.0–8.3)

## 2013-01-21 LAB — LACTIC ACID, PLASMA: Lactic Acid, Venous: 2.3 mmol/L — ABNORMAL HIGH (ref 0.5–2.2)

## 2013-01-21 MED ORDER — SODIUM CHLORIDE 0.9 % IV SOLN
Freq: Once | INTRAVENOUS | Status: AC
  Administered 2013-01-21: 17:00:00 via INTRAVENOUS

## 2013-01-21 MED ORDER — ONDANSETRON HCL 4 MG/2ML IJ SOLN
4.0000 mg | Freq: Once | INTRAMUSCULAR | Status: AC
  Administered 2013-01-21: 4 mg via INTRAVENOUS
  Filled 2013-01-21: qty 2

## 2013-01-21 MED ORDER — HYDROMORPHONE HCL PF 1 MG/ML IJ SOLN
0.5000 mg | Freq: Once | INTRAMUSCULAR | Status: AC
  Administered 2013-01-21: 0.5 mg via INTRAVENOUS
  Filled 2013-01-21: qty 1

## 2013-01-21 MED ORDER — SODIUM CHLORIDE 0.9 % IV SOLN
Freq: Once | INTRAVENOUS | Status: AC
  Administered 2013-01-21: 13:00:00 via INTRAVENOUS

## 2013-01-21 MED ORDER — HYDROCODONE-ACETAMINOPHEN 5-325 MG PO TABS: 1.0000 | ORAL_TABLET | Freq: Four times a day (QID) | ORAL | Status: DC | PRN

## 2013-01-21 MED ORDER — IOHEXOL 300 MG/ML  SOLN
50.0000 mL | Freq: Once | INTRAMUSCULAR | Status: AC | PRN
  Administered 2013-01-21: 50 mL via ORAL

## 2013-01-21 NOTE — ED Provider Notes (Signed)
History    This chart was scribed for Summer Muskrat, MD, by Joline Maxcy, ED scribe. The patient was seen in room APA06/APA06 and the patient's care was started at 1303.    CSN: [id number]  Arrival date & time 01/21/13  1241   First MD Initiated Contact with Patient 01/21/13 1303      Chief Complaint  Patient presents with  . Nausea  . Emesis  . Diarrhea    (Consider location/radiation/quality/duration/timing/severity/associated sxs/prior treatment) The history is provided by the patient and medical records. No language interpreter was used.   Summer Cook is a 47 y.o. female with a h/o of ITP and adrenal dysfunction, who presents to the Emergency Department complaining of sudden onset, intermittent emesis with associated diarrhea and constant abdominal pain and nausea that began 2 weeks ago. She also complains of mild visual disturbances, intermittent CP, headache, confusion that began recently. .She denies any fever or SOB. She states that she had a colonoscopy performed 2 days before the onset of her symptoms. She states that she came to the ED on 04/23 and was discharged with Lomotil and Zofran, which briefly provided relief of her symptoms. She has a h/o of chronic steroid use for her ITP and is currently taking 50 mg of prednisone daily.   PCP is [name].   Past Medical History  Diagnosis Date  . Essential hypertension, benign   . 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  Last was Prednisone 40mg ; 2 wk in 10/2012.  She also was on Prednisone bridged with Cellcept briefly but stopped due to lack of insurance.   . Depression   . Thrush, oral 05/29/2011  . Steroid-induced diabetes   . Pulmonary embolism 04/2012  . Asthma   . Renal insufficiency     Past Surgical History  Procedure Laterality Date  . Cholecystectomy  2008  . Bone marrow biopsy    . Splenectomy,  total  01/2011  . Colonoscopy with esophagogastroduodenoscopy (egd) N/A 01/04/2013    Procedure: COLONOSCOPY WITH ESOPHAGOGASTRODUODENOSCOPY (EGD);  Surgeon: Daneil Dolin, MD;  Location: [facility];  Service: Endoscopy;  Laterality: N/A;  12:45-rescheduled to 1115 Darius Bump to notify pt    Family History  Problem Relation Age of Onset  . Cervical cancer Mother   . Lung cancer Father   . Pneumonia Brother   . Cancer Paternal Grandmother 54    colon cancer, breast cancer  . Cancer Paternal Grandfather 59    colon cancer  . AAA (abdominal aortic aneurysm) Paternal Grandmother     History  Substance Use Topics  . Smoking status: Former Smoker    Types: Cigarettes    Quit date: 02/15/2011  . Smokeless tobacco: Never Used  . Alcohol Use: No    OB History   Grav Para Term Preterm Abortions TAB SAB Ect Mult Living                  Review of Systems A complete 10 system review of systems was obtained and all systems are negative except as noted in the HPI and PMH.  Allergies  Yellow jacket venom  Home Medications   Current Outpatient Rx  Name  Route  Sig  Dispense  Refill  . albuterol (PROVENTIL HFA;VENTOLIN HFA) 108 (90 BASE) MCG/ACT inhaler   Inhalation   Inhale 2 puffs into the lungs every 6 (six) hours as needed for wheezing.         Marland Kitchen  ALPRAZolam (XANAX) 1 MG tablet   Oral   Take 1 mg by mouth 3 (three) times daily as needed for anxiety.          . Cholecalciferol (VITAMIN D) 2000 UNITS CAPS   Oral   Take 1 capsule by mouth daily.         . diphenoxylate-atropine (LOMOTIL) 2.5-0.025 MG per tablet   Oral   Take 1 tablet by mouth 4 (four) times daily as needed for diarrhea or loose stools.   20 tablet   0   . escitalopram (LEXAPRO) 20 MG tablet   Oral   Take 20 mg by mouth daily.         Marland Kitchen HYDROcodone-acetaminophen (NORCO/VICODIN) 5-325 MG per tablet   Oral   Take 1 tablet by mouth every 6 (six) hours as needed for pain.         . mycophenolate  (CELLCEPT) 500 MG tablet   Oral   Take 500-1,000 mg by mouth 2 (two) times daily. One in the morning and 2 in the evening         . ondansetron (ZOFRAN) 8 MG tablet   Oral   Take 1 tablet (8 mg total) by mouth every 4 (four) hours as needed for nausea.   10 tablet   0   . predniSONE (DELTASONE) 10 MG tablet   Oral   Take 50 mg by mouth daily.          Marland Kitchen triamterene-hydrochlorothiazide (DYAZIDE) 37.5-25 MG per capsule   Oral   Take 1 capsule by mouth daily.         . vitamin B-12 (CYANOCOBALAMIN) 1000 MCG tablet   Oral   Take 2,000 mcg by mouth 2 (two) times daily.          Marland Kitchen warfarin (COUMADIN) 3 MG tablet   Oral   Take 3 mg by mouth daily.           BP 129/84  Pulse 137  Temp(Src) 98.2 F (36.8 C) (Oral)  Resp 18  SpO2 97%  Physical Exam  Nursing note and vitals reviewed. Constitutional: She is oriented to person, place, and time. She appears well-developed and well-nourished. No distress.  HENT:  Head: Normocephalic and atraumatic.  Eyes: Conjunctivae and EOM are normal.  Cardiovascular: Normal rate and regular rhythm.   Pulmonary/Chest: Effort normal and breath sounds normal. No stridor. No respiratory distress.  Abdominal: Soft. She exhibits no distension. There is tenderness. There is no guarding.  Pain in the epigastrium and LLQ.  Musculoskeletal: She exhibits no edema.  Neurological: She is alert and oriented to person, place, and time. No cranial nerve deficit.  Skin: Skin is warm and dry.  Psychiatric: She has a normal mood and affect.    ED Course  Procedures (including critical care time)  COORDINATION OF CARE:  13:37- Discussed planned course of treatment with the patient, including blood work and an abdominal pelvis CT without contrast, who is agreeable at this time.  4:32 PM Patient's results reviewed with her and family. She is feeling better, though not entirely.  Additional meds ordered.  Results for orders placed during the  hospital encounter of 01/21/13  CBC WITH DIFFERENTIAL      Result Value Range   WBC 8.7  4.0 - 10.5 K/uL   RBC 6.12 (*) 3.87 - 5.11 MIL/uL   Hemoglobin 14.4  12.0 - 15.0 g/dL   HCT 44.8  36.0 - 46.0 %   MCV 73.2 (*)  78.0 - 100.0 fL   MCH 23.5 (*) 26.0 - 34.0 pg   MCHC 32.1  30.0 - 36.0 g/dL   RDW 19.3 (*) 11.5 - 15.5 %   Platelets 201  150 - 400 K/uL   Neutrophils Relative 90 (*) 43 - 77 %   Neutro Abs 7.8 (*) 1.7 - 7.7 K/uL   Lymphocytes Relative 5 (*) 12 - 46 %   Lymphs Abs 0.4 (*) 0.7 - 4.0 K/uL   Monocytes Relative 5  3 - 12 %   Monocytes Absolute 0.5  0.1 - 1.0 K/uL   Eosinophils Relative 0  0 - 5 %   Eosinophils Absolute 0.0  0.0 - 0.7 K/uL   Basophils Relative 0  0 - 1 %   Basophils Absolute 0.0  0.0 - 0.1 K/uL   Smear Review PERFORMED    BASIC METABOLIC PANEL      Result Value Range   Sodium 132 (*) 135 - 145 mEq/L   Potassium 3.7  3.5 - 5.1 mEq/L   Chloride 93 (*) 96 - 112 mEq/L   CO2 27  19 - 32 mEq/L   Glucose, Bld 157 (*) 70 - 99 mg/dL   BUN 21  6 - 23 mg/dL   Creatinine, Ser 1.50 (*) 0.50 - 1.10 mg/dL   Calcium 8.6  8.4 - 10.5 mg/dL   GFR calc non Af Amer 40 (*) >90 mL/min   GFR calc Af Amer 47 (*) >90 mL/min  HEPATIC FUNCTION PANEL      Result Value Range   Total Protein 6.6  6.0 - 8.3 g/dL   Albumin 3.8  3.5 - 5.2 g/dL   AST 52 (*) 0 - 37 U/L   ALT 78 (*) 0 - 35 U/L   Alkaline Phosphatase 49  39 - 117 U/L   Total Bilirubin 0.5  0.3 - 1.2 mg/dL   Bilirubin, Direct 0.2  0.0 - 0.3 mg/dL   Indirect Bilirubin 0.3  0.3 - 0.9 mg/dL  LACTIC ACID, PLASMA      Result Value Range   Lactic Acid, Venous 2.3 (*) 0.5 - 2.2 mmol/L   Labs Reviewed  CBC WITH DIFFERENTIAL - Abnormal; Notable for the following:    RBC 6.12 (*)    MCV 73.2 (*)    MCH 23.5 (*)    RDW 19.3 (*)    All other components within normal limits  BASIC METABOLIC PANEL   No results found.   No diagnosis found.  At length we discussed results and need for followup with both the patient's  primary care physician, her gastroenterologic team.  Additionally, the patient was counseled to follow up with her endocrinologist as soon as possible.  MDM   I personally performed the services described in this documentation, which was scribed in my presence. The recorded information has been reviewed and is accurate.   This patient presents with concern for ongoing abdominal pain, nausea, vomiting, diarrhea. She has multiple medical problems, including chronic steroid dependency recent episode of urinary tract infection, chronic pain. On exam the patient is awake alert, appropriate interactive, though she appears uncomfortable.  Given her history, tenderness to palpation on exam she had CT scan as well as labs.  Patient's labs are largely stable for her, improved in some regards, and her CT did not demonstrate acute pathology.  Following multiple lengthy conversations, the patient was discharged in stable condition with multiple followup options  Summer Muskrat, MD 01/21/13 [phone]

## 2013-01-21 NOTE — ED Notes (Signed)
MD at bedside. 

## 2013-01-21 NOTE — ED Notes (Signed)
Pt c/o n/v/d, abd pain that started day after having colonoscopy performed, pt states that she was seen in er two days ago and is not getting any better,

## 2013-01-23 ENCOUNTER — Telehealth: Payer: Self-pay

## 2013-01-23 ENCOUNTER — Encounter (HOSPITAL_BASED_OUTPATIENT_CLINIC_OR_DEPARTMENT_OTHER): Payer: BC Managed Care – PPO

## 2013-01-23 DIAGNOSIS — D693 Immune thrombocytopenic purpura: Secondary | ICD-10-CM

## 2013-01-23 LAB — CBC
Hemoglobin: 14 g/dL (ref 12.0–15.0)
MCH: 23.7 pg — ABNORMAL LOW (ref 26.0–34.0)
MCV: 72.8 fL — ABNORMAL LOW (ref 78.0–100.0)
RBC: 5.91 MIL/uL — ABNORMAL HIGH (ref 3.87–5.11)

## 2013-01-23 NOTE — Progress Notes (Signed)
Labs drawn today for cbc 

## 2013-01-23 NOTE — Telephone Encounter (Signed)
Pt is very sick. She has been to the ER twice to get fluids. They also did a CT scan and blood work.They told her she needed to see her GI doctor. Is there anyway she can come in this week in an urgent spot. Please advise

## 2013-01-24 ENCOUNTER — Telehealth: Payer: Self-pay | Admitting: *Deleted

## 2013-01-24 NOTE — Telephone Encounter (Signed)
Pt will be here for her appointment Wednesday at 3:00

## 2013-01-24 NOTE — Telephone Encounter (Signed)
Called pt w/ Dr. Agustina Caroli message below.  She reports ongoing diarrhea and some n/v over the past week and a half.  She had colonoscopy  2 weeks ago and feels it started a few days afterwards.  She is trying to get appt to see her GI MD soon.  Instructed pt to continue same dose of Prednisone 50 mg daily and decrease Cellcept to 1,000 mg in am and only 500 mg in pm.   Push PO fluids and take Imodium OTC as directed for diarrhea.  Pt verbalized understanding of instructions.

## 2013-01-24 NOTE — Telephone Encounter (Signed)
RMR unavailable today. Looks like Dr. Agustina Caroli office to contact patient today to consider decreasing Cellcept (possible cause of her symptoms).   Noncontrast CT done in ER on 01/21/13 without evidence of bowel obstruction. Nothing to explain her symptoms.   Lab Results  Component Value Date   WBC 10.3 01/23/2013   HGB 14.0 01/23/2013   HCT 43.0 01/23/2013   MCV 72.8* 01/23/2013   PLT 236 01/23/2013   Lab Results  Component Value Date   CREATININE 1.50* 01/21/2013   BUN 21 01/21/2013   NA 132* 01/21/2013   K 3.7 01/21/2013   CL 93* 01/21/2013   CO2 27 01/21/2013   Lab Results  Component Value Date   ALT 78* 01/21/2013   AST 52* 01/21/2013   ALKPHOS 49 01/21/2013   BILITOT 0.5 01/21/2013   Previous viral markers in 2011 were negative for Hep A/B/C.  EGD 01/04/13 by Dr. Jennet Maduro hernia, abnormal gastric antrum (reactive gastropathy but no h.pylori), esophageal plaques (KOH + for yeast treated with Diflucan).  Colonoscopy 01/04/13 by Dr. Madolyn Frieze canal hemorrhoids and colonic diverticulosis. Single small colonic polyp (sessile serrated polyp/adenoma).   AST/ALT up again. Previously up last year. Liver normal on U/S 11/2011. ?etiology ?meds.  For now, she needs to be on a PPI if not. Please find out if she is.Consider pantoprazole 40mg  daily. She should get her coumadin level checked after being on the medication for one week.    Offer her my 3:00 urgent for tomorrow.

## 2013-01-24 NOTE — Telephone Encounter (Signed)
Thanks

## 2013-01-24 NOTE — Telephone Encounter (Signed)
Reroute

## 2013-01-24 NOTE — Telephone Encounter (Signed)
Message copied by Maudie Mercury on Tue Jan 24, 2013  9:21 AM ------      Message from: HA, Trudee Grip T      Created: Tue Jan 24, 2013 12:33 AM       Please call patient and inform her that her plt count is normal (She had recurrent ITP).            Per ED record, she has been having nausea/vomiting, diarrhea.  This could be due to higher dose of Cellcept, or could be unrelated to a viral gastroenteritis.              1.  If she is still having these symptoms, then continue her prednisone to 50mg  daily, and decrease Cellcept to 1,000 mg PO am but only 500mg  PO qpm.             2.  If these symptoms have improved, then decrease Prednisone to 45mg  PO daily; but continue Cellcept at 1,000mg  PO BID.             Thanks.                      ------

## 2013-01-24 NOTE — Telephone Encounter (Signed)
Patient really needs to be seen by MD at this point, she is very complicated. Sees RMR. He has opening today. See if she can make it today with RMR.

## 2013-01-25 ENCOUNTER — Encounter (HOSPITAL_COMMUNITY): Payer: Self-pay | Admitting: *Deleted

## 2013-01-25 ENCOUNTER — Observation Stay (HOSPITAL_COMMUNITY)
Admission: AD | Admit: 2013-01-25 | Discharge: 2013-01-27 | Disposition: A | Discharge status: Home / Self Care | Payer: BC Managed Care – PPO | Source: Ambulatory Visit | Attending: Family Medicine | Admitting: Family Medicine

## 2013-01-25 ENCOUNTER — Other Ambulatory Visit: Payer: BC Managed Care – PPO

## 2013-01-25 ENCOUNTER — Ambulatory Visit (INDEPENDENT_AMBULATORY_CARE_PROVIDER_SITE_OTHER): Payer: BC Managed Care – PPO | Admitting: Gastroenterology

## 2013-01-25 ENCOUNTER — Encounter: Payer: Self-pay | Admitting: Gastroenterology

## 2013-01-25 ENCOUNTER — Telehealth: Payer: Self-pay | Admitting: Family Medicine

## 2013-01-25 VITALS — BP 122/98 | HR 100 | Temp 97.4°F | Ht 64.0 in | Wt 203.6 lb

## 2013-01-25 DIAGNOSIS — F329 Major depressive disorder, single episode, unspecified: Secondary | ICD-10-CM

## 2013-01-25 DIAGNOSIS — E876 Hypokalemia: Secondary | ICD-10-CM | POA: Diagnosis present

## 2013-01-25 DIAGNOSIS — I951 Orthostatic hypotension: Secondary | ICD-10-CM

## 2013-01-25 DIAGNOSIS — R197 Diarrhea, unspecified: Principal | ICD-10-CM | POA: Diagnosis present

## 2013-01-25 DIAGNOSIS — J45909 Unspecified asthma, uncomplicated: Secondary | ICD-10-CM | POA: Insufficient documentation

## 2013-01-25 DIAGNOSIS — R112 Nausea with vomiting, unspecified: Secondary | ICD-10-CM | POA: Insufficient documentation

## 2013-01-25 DIAGNOSIS — E669 Obesity, unspecified: Secondary | ICD-10-CM

## 2013-01-25 DIAGNOSIS — T50905A Adverse effect of unspecified drugs, medicaments and biological substances, initial encounter: Secondary | ICD-10-CM

## 2013-01-25 DIAGNOSIS — R7989 Other specified abnormal findings of blood chemistry: Secondary | ICD-10-CM

## 2013-01-25 DIAGNOSIS — N289 Disorder of kidney and ureter, unspecified: Secondary | ICD-10-CM

## 2013-01-25 DIAGNOSIS — Z7901 Long term (current) use of anticoagulants: Secondary | ICD-10-CM

## 2013-01-25 DIAGNOSIS — D899 Disorder involving the immune mechanism, unspecified: Secondary | ICD-10-CM

## 2013-01-25 DIAGNOSIS — R945 Abnormal results of liver function studies: Secondary | ICD-10-CM

## 2013-01-25 DIAGNOSIS — Z86711 Personal history of pulmonary embolism: Secondary | ICD-10-CM | POA: Diagnosis present

## 2013-01-25 DIAGNOSIS — R000 Tachycardia, unspecified: Secondary | ICD-10-CM

## 2013-01-25 DIAGNOSIS — R1013 Epigastric pain: Secondary | ICD-10-CM | POA: Diagnosis present

## 2013-01-25 DIAGNOSIS — K5900 Constipation, unspecified: Secondary | ICD-10-CM

## 2013-01-25 DIAGNOSIS — E099 Drug or chemical induced diabetes mellitus without complications: Secondary | ICD-10-CM | POA: Diagnosis present

## 2013-01-25 DIAGNOSIS — I2699 Other pulmonary embolism without acute cor pulmonale: Secondary | ICD-10-CM

## 2013-01-25 DIAGNOSIS — E871 Hypo-osmolality and hyponatremia: Secondary | ICD-10-CM

## 2013-01-25 DIAGNOSIS — R739 Hyperglycemia, unspecified: Secondary | ICD-10-CM

## 2013-01-25 DIAGNOSIS — Z9081 Acquired absence of spleen: Secondary | ICD-10-CM

## 2013-01-25 DIAGNOSIS — R51 Headache: Secondary | ICD-10-CM

## 2013-01-25 DIAGNOSIS — F32A Depression, unspecified: Secondary | ICD-10-CM

## 2013-01-25 DIAGNOSIS — K625 Hemorrhage of anus and rectum: Secondary | ICD-10-CM

## 2013-01-25 DIAGNOSIS — D849 Immunodeficiency, unspecified: Secondary | ICD-10-CM

## 2013-01-25 DIAGNOSIS — D693 Immune thrombocytopenic purpura: Secondary | ICD-10-CM | POA: Diagnosis present

## 2013-01-25 DIAGNOSIS — I10 Essential (primary) hypertension: Secondary | ICD-10-CM

## 2013-01-25 DIAGNOSIS — K921 Melena: Secondary | ICD-10-CM

## 2013-01-25 LAB — COMPREHENSIVE METABOLIC PANEL
Alkaline Phosphatase: 47 U/L (ref 39–117)
BUN: 26 mg/dL — ABNORMAL HIGH (ref 6–23)
CO2: 27 mEq/L (ref 19–32)
GFR calc Af Amer: 51 mL/min — ABNORMAL LOW (ref >90)
GFR calc non Af Amer: 44 mL/min — ABNORMAL LOW (ref >90)
Glucose, Bld: 126 mg/dL — ABNORMAL HIGH (ref 70–99)
Potassium: 3.1 mEq/L — ABNORMAL LOW (ref 3.5–5.1)
Total Protein: 6.1 g/dL (ref 6.0–8.3)

## 2013-01-25 LAB — LIPASE, BLOOD: Lipase: 68 U/L — ABNORMAL HIGH (ref 11–59)

## 2013-01-25 LAB — CBC
HCT: 40.2 % (ref 36.0–46.0)
MCHC: 32.6 g/dL (ref 30.0–36.0)
MCV: 71.9 fL — ABNORMAL LOW (ref 78.0–100.0)
RDW: 19.6 % — ABNORMAL HIGH (ref 11.5–15.5)

## 2013-01-25 MED ORDER — ALBUTEROL SULFATE HFA 108 (90 BASE) MCG/ACT IN AERS: 2.0000 | INHALATION_SPRAY | Freq: Four times a day (QID) | RESPIRATORY_TRACT | Status: DC | PRN

## 2013-01-25 MED ORDER — HYDROCODONE-ACETAMINOPHEN 5-325 MG PO TABS
1.0000 | ORAL_TABLET | Freq: Four times a day (QID) | ORAL | Status: DC | PRN
  Administered 2013-01-26 – 2013-01-27 (×3): 1 via ORAL
  Filled 2013-01-25 (×4): qty 1

## 2013-01-25 MED ORDER — ACETAMINOPHEN 650 MG RE SUPP: 650.0000 mg | Freq: Four times a day (QID) | RECTAL | Status: DC | PRN

## 2013-01-25 MED ORDER — PANTOPRAZOLE SODIUM 40 MG IV SOLR
40.0000 mg | INTRAVENOUS | Status: DC
  Administered 2013-01-25 – 2013-01-26 (×2): 40 mg via INTRAVENOUS
  Filled 2013-01-25 (×2): qty 40

## 2013-01-25 MED ORDER — ESCITALOPRAM OXALATE 10 MG PO TABS
20.0000 mg | ORAL_TABLET | Freq: Every day | ORAL | Status: DC
  Administered 2013-01-25 – 2013-01-27 (×3): 20 mg via ORAL
  Filled 2013-01-25 (×3): qty 2

## 2013-01-25 MED ORDER — POTASSIUM CHLORIDE CRYS ER 20 MEQ PO TBCR
40.0000 meq | EXTENDED_RELEASE_TABLET | Freq: Two times a day (BID) | ORAL | Status: AC
  Administered 2013-01-25 – 2013-01-26 (×2): 40 meq via ORAL
  Filled 2013-01-25: qty 1
  Filled 2013-01-25: qty 2

## 2013-01-25 MED ORDER — TRIAMTERENE-HCTZ 37.5-25 MG PO CAPS
1.0000 | ORAL_CAPSULE | Freq: Every day | ORAL | Status: DC
  Filled 2013-01-25 (×2): qty 1

## 2013-01-25 MED ORDER — MORPHINE SULFATE 2 MG/ML IJ SOLN
2.0000 mg | INTRAMUSCULAR | Status: DC | PRN
  Administered 2013-01-25 – 2013-01-27 (×8): 2 mg via INTRAVENOUS
  Filled 2013-01-25 (×8): qty 1

## 2013-01-25 MED ORDER — ONDANSETRON HCL 4 MG/2ML IJ SOLN: 4.0000 mg | Freq: Four times a day (QID) | INTRAMUSCULAR | Status: DC | PRN

## 2013-01-25 MED ORDER — SODIUM CHLORIDE 0.9 % IV SOLN
INTRAVENOUS | Status: DC
  Administered 2013-01-25 – 2013-01-27 (×3): via INTRAVENOUS

## 2013-01-25 MED ORDER — ALUM & MAG HYDROXIDE-SIMETH 200-200-20 MG/5ML PO SUSP
30.0000 mL | Freq: Four times a day (QID) | ORAL | Status: DC | PRN
  Administered 2013-01-26: 30 mL via ORAL
  Filled 2013-01-25: qty 30

## 2013-01-25 MED ORDER — SODIUM CHLORIDE 0.9 % IJ SOLN
3.0000 mL | Freq: Two times a day (BID) | INTRAMUSCULAR | Status: DC
  Administered 2013-01-25 – 2013-01-26 (×2): 3 mL via INTRAVENOUS

## 2013-01-25 MED ORDER — ONDANSETRON HCL 8 MG PO TABS: 8.0000 mg | ORAL_TABLET | Freq: Three times a day (TID) | ORAL | Status: DC | PRN

## 2013-01-25 MED ORDER — PREDNISONE 20 MG PO TABS
50.0000 mg | ORAL_TABLET | Freq: Every day | ORAL | Status: DC
  Administered 2013-01-26 – 2013-01-27 (×2): 50 mg via ORAL
  Filled 2013-01-25: qty 1
  Filled 2013-01-25: qty 2

## 2013-01-25 MED ORDER — ALPRAZOLAM 1 MG PO TABS
1.0000 mg | ORAL_TABLET | Freq: Three times a day (TID) | ORAL | Status: DC | PRN
  Administered 2013-01-25 – 2013-01-26 (×2): 1 mg via ORAL
  Filled 2013-01-25 (×2): qty 1

## 2013-01-25 MED ORDER — ONDANSETRON HCL 4 MG PO TABS: 4.0000 mg | ORAL_TABLET | Freq: Four times a day (QID) | ORAL | Status: DC | PRN

## 2013-01-25 MED ORDER — ACETAMINOPHEN 325 MG PO TABS: 650.0000 mg | ORAL_TABLET | Freq: Four times a day (QID) | ORAL | Status: DC | PRN

## 2013-01-25 NOTE — Patient Instructions (Signed)
Please have your blood work done ASAP. Please collect stool and return specimen to the lab. Prescription for Zofran since your pharmacy. To maintain hydration, make sure you drink at least 4 ounces of non-caffeinated beverages per hour.  Diarrhea Diarrhea is watery poop (stool). The most common cause of diarrhea is a germ. Other causes include:  Food poisoning.  A reaction to medicine. HOME CARE   Drink clear fluids. This can stop you from losing too much body fluid (dehydration).  Drink enough fluids to keep your pee (urine) clear or pale yellow.  Avoid solid foods and dairy products until you start to feel better. Then start eating bland foods, such as:  Bananas.  Rice.  Crackers.  Applesauce.  Dry toast.  Avoid spicy foods, caffeine, and alcohol.  Your doctor may give medicine to help with cramps and watery poop. Take this as told. Avoid these medicines if you have a fever or blood in your poop.  Take your medicine as told. Finish them even if you start to feel better. GET HELP RIGHT AWAY IF:   The watery poop lasts longer than 3 days.  You have a fever.  Your baby is older than 3 months with a rectal temperature of 100.5 F (38.1 C) or higher for more than 1 day.  There is blood in your poop.  You start to throw up (vomit).  You lose too much fluid. MAKE SURE YOU:   Understand these instructions.  Will watch your condition.  Will get help right away if you are not doing well or get worse. Document Released: 03/02/2008 Document Revised: 12/07/2011 Document Reviewed: 03/02/2008 [facility] Patient Information 2013 [place].

## 2013-01-25 NOTE — Progress Notes (Signed)
ANTICOAGULATION CONSULT NOTE - Initial Consult  Pharmacy Consult for Warfarin Indication:  VTE treatment - history of PE  Allergies  Allergen Reactions  . Yellow Jacket Venom Swelling    Patient Measurements: Height: 5\' 4"  (162.6 cm) Weight: 203 lb 4.2 oz (92.2 kg) IBW/kg (Calculated) : 54.7   Vital Signs: Temp: 97.4 F (36.3 C) (04/30 1327) Temp src: Oral (04/30 1327) BP: 122/98 mmHg (04/30 1503) Pulse Rate: 100 (04/30 1503)  Labs:  Recent Labs  01/23/13 0947 01/25/13 1652  HGB 14.0 13.1  HCT 43.0 40.2  PLT 236 256  LABPROT  --  33.9*  INR  --  3.61*  CREATININE  --  1.39*    Estimated Creatinine Clearance: 55.1 ml/min (by C-G formula based on Cr of 1.39).   Medical History: Past Medical History  Diagnosis Date  . Essential hypertension, benign   . 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  Last was Prednisone 40mg ; 2 wk in 10/2012.  She also was on Prednisone bridged with Cellcept briefly but stopped due to lack of insurance.   . Depression   . Thrush, oral 05/29/2011  . Steroid-induced diabetes   . Pulmonary embolism 04/2012  . Asthma   . Renal insufficiency     Medications:  Scheduled:  . escitalopram  20 mg Oral Daily  . pantoprazole (PROTONIX) IV  40 mg Intravenous Q24H  . potassium chloride  40 mEq Oral BID  . [START ON 01/26/2013] predniSONE  50 mg Oral Daily  . sodium chloride  3 mL Intravenous Q12H  . [START ON 01/26/2013] triamterene-hydrochlorothiazide  1 each Oral Daily    Assessment: Patient on Coumadin at home for history of PE INR elevated 3.61   Goal of Therapy:  INR 2-3 Monitor platelets by anticoagulation protocol: Yes   Plan:  Hold Coumadin tonight due to elevated INR INR / PT daily CBC daily   Abner Greenspan, Radonna Bracher Bennett 01/25/2013,6:24 PM

## 2013-01-25 NOTE — Telephone Encounter (Signed)
Direct Admit Request telephone note  Requesting physician: Magda Paganini Lewis/Dr. Rourk Time of request: 477  HPI: 47 year old woman with history of ITP managed by Dr. as well as history of PE 2013 presented to the GI office today with 2 week history of vomiting and diarrhea. Prior to today's visit she did see in the ER twice in the last week for the same. CT performed 4/26 was unremarkable. For the last 2 weeks she has had very poor oral intake. 12 pound weight loss documented. Noted to be orthostatic and direct admission requested felt to be stable for telemetry floor..   Reported Vitals at time of conversation: Systolic blood pressure 99991111, pulse 120s.  Reported exam: Reported to be nontoxic.  Data: None  Reported impression:  Two-week history of nausea, vomiting and diarrhea with 12 pound weight loss.  Orthostatic hypotension, dehydration  History of ITP currently on prednisone and CellCept.  History of pulmonary embolism 2013  Plan based on telephone conversation:  Admit to telemetry  IV fluids, rehydration, labs  Further recommendations per GI.  Murray Hodgkins, MD [facility] [phone] 01/25/2013, 3:34 PM

## 2013-01-25 NOTE — H&P (Signed)
History and Physical  CHITARA PITMON [id number] DOB: [birth year] DOA: 01/25/2013  Referring physician: Magda Paganini Lewis/Dr. Gala Romney PCP: Glo Herring., MD  Hematologist: Sherryl Manges, M.D.  Chief Complaint: Diarrhea  HPI:  47 year old woman presented to the gastroenterology office today for followup with complaint of two-week history of diarrhea and abdominal pain. Although nontoxic in appearance was noted to be profoundly orthostatic. 12 pound weight loss documented and referred directly for admission for further evaluation.  History obtained from patient at bedside. Case also discussed by telephone with Neil Crouch. Patient reports 2 weeks ago she is thereafter felt very full and then vomited. For the last 2 weeks following that she has had daily diarrhea, multiple episodes too numerous to count. She said no vomiting for the last several days but continues to have nausea and is had very poor oral intake tends to cause nausea. She has had abdominal pain which is located primarily in the epigastrium sometimes in the lower abdomen it is constant in presence however severity waxes and wanes. There have been no aggravating or alleviating factors and her outpatient oral narcotics have not helped. She has not had any recent travel nor drink untreated water. She shares her meals with family members and none of them are sick. She has been dizzy when standing up and although she has not passed out she is become weaker and weaker.  Review of Systems:  Negative for fever, sore throat, rash, new muscle aches, chest pain, SOB, dysuria, bleeding.  Positive for blurred vision the last 2 weeks  Past Medical History  Diagnosis Date  . Essential hypertension, benign   . 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  Last was Prednisone 40mg ; 2 wk in 10/2012.  She also was on Prednisone bridged with Cellcept  briefly but stopped due to lack of insurance.   . Depression   . Thrush, oral 05/29/2011  . Steroid-induced diabetes   . Pulmonary embolism 04/2012  . Asthma   . Renal insufficiency     Past Surgical History  Procedure Laterality Date  . Cholecystectomy  2008  . Bone marrow biopsy    . 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 Colonoscopy with esophagogastroduodenoscopy (egd) N/A 01/04/2013    Dr. Gala Romney: esophageal plaques with +KOH, hh. Gastric antrum abnormal, bx reactive gastropathy. Anal canal hemorrhoids, colonic tics, normal TI, single polyp (sessile serrated tubular adenoma). Next TCS 12/2017    Social History:  reports that she quit smoking about 1 years ago. Her smoking use included Cigarettes. She smoked 0.00 packs per day. She has never used smokeless tobacco. She reports that she does not drink alcohol or use illicit drugs.  Allergies  Allergen Reactions  . Yellow Jacket Venom Swelling    Family History  Problem Relation Age of Onset  . Cervical cancer Mother   . Lung cancer Father   . Pneumonia Brother   . Cancer Paternal Grandmother 38    colon cancer, breast cancer  . Cancer Paternal Grandfather 29    colon cancer  . AAA (abdominal aortic aneurysm) Paternal Grandmother      Prior to Admission medications   Medication Sig Start Date End Date Taking? Authorizing Provider  albuterol (PROVENTIL HFA;VENTOLIN HFA) 108 (90 BASE) MCG/ACT inhaler Inhale 2 puffs into the lungs every 6 (six) hours as needed for wheezing.    Historical Provider, MD  ALPRAZolam Duanne Moron) 1 MG tablet Take 1 mg by mouth 3 (  three) times daily as needed for anxiety.     Historical Provider, MD  Cholecalciferol (VITAMIN D) 2000 UNITS CAPS Take 1 capsule by mouth daily.    Historical Provider, MD  diphenoxylate-atropine (LOMOTIL) 2.5-0.025 MG per tablet Take 1 tablet by mouth 4 (four) times daily as needed for diarrhea or loose stools. 01/18/13   [name], MD  escitalopram (LEXAPRO) 20 MG tablet Take 20 mg by  mouth daily.    Historical Provider, MD  HYDROcodone-acetaminophen (NORCO/VICODIN) 5-325 MG per tablet Take 1 tablet by mouth every 6 (six) hours as needed for pain. 01/21/13   Carmin Muskrat, MD  mycophenolate (CELLCEPT) 500 MG tablet Take 500-1,000 mg by mouth 2 (two) times daily. Two in the morning and 1 in the evening    Historical Provider, MD  ondansetron (ZOFRAN) 8 MG tablet Take 1 tablet (8 mg total) by mouth every 8 (eight) hours as needed for nausea. 01/25/13   Mahala Menghini, PA-C  predniSONE (DELTASONE) 10 MG tablet Take 50 mg by mouth daily.  12/07/12   Nobie Putnam, MD  triamterene-hydrochlorothiazide (DYAZIDE) 37.5-25 MG per capsule Take 1 capsule by mouth daily.    Historical Provider, MD  vitamin B-12 (CYANOCOBALAMIN) 1000 MCG tablet Take 2,000 mcg by mouth 2 (two) times daily.     Historical Provider, MD  warfarin (COUMADIN) 1 MG tablet Take 2-3 mg by mouth as directed. Takes 3 mg per night    Historical Provider, MD   Physical Exam: There were no vitals filed for this visit. Temperature 97.4, heart rate 100, blood pressure 120/98  General:  Appears calm and comfortable Eyes: PERRL, normal lids, irises  ENT: grossly normal hearing. Dry mucous membranes. Lips appear chapped. Neck: no LAD, masses or thyromegaly Cardiovascular: RRR, no m/r/g. No LE edema. Respiratory: CTA bilaterally, no w/r/r. Normal respiratory effort. Abdomen: soft, no rebound or guarding. Mild epigastric and right lower quadrant pain. Skin: no rash or induration seen  Musculoskeletal: grossly normal tone BUE/BLE Psychiatric: grossly normal mood and affect, speech fluent and appropriate Neurologic: grossly non-focal.  Wt Readings from Last 3 Encounters:  01/25/13 92.352 kg (203 lb 9.6 oz)  01/18/13 92.534 kg (204 lb)  12/26/12 97.886 kg (215 lb 12.8 oz)    Labs on Admission:  Basic Metabolic Panel:  Recent Labs Lab 01/21/13 1310  NA 132*  K 3.7  CL 93*  CO2 27  GLUCOSE 157*  BUN 21  CREATININE  1.50*  CALCIUM 8.6    Liver Function Tests:  Recent Labs Lab 01/21/13 1310  AST 52*  ALT 78*  ALKPHOS 49  BILITOT 0.5  PROT 6.6  ALBUMIN 3.8   No results found for this basename: LIPASE, AMYLASE,  in the last 168 hours  CBC:  Recent Labs Lab 01/21/13 1310 01/23/13 0947  WBC 8.7 10.3  NEUTROABS 7.8*  --   HGB 14.4 14.0  HCT 44.8 43.0  MCV 73.2* 72.8*  PLT 201 236    Radiological Exams on Admission: No results found. CT abdomen pelvis 4/26 reviewed.   Principal Problem:   Diarrhea Active Problems:   ITP (idiopathic thrombocytopenic purpura)   Drug-induced hyperglycemia   Post-splenectomy   PE (pulmonary embolism)   Abdominal pain, epigastric   Chronic anticoagulation   Orthostatic hypotension   Assessment/Plan 1. Orthostatic hypotension, dehydration: Repeat orthostatics in the morning. IV fluids. Secondary to diarrhea and poor oral intake.  2. Two-week history of vomiting, diarrhea, abdominal pain: Etiology unclear. CT abdomen and pelvis 4/26 was unremarkable.  Exam benign. No  recent  leukocytosis.Stool culture, further stool studies per gastroenterology. Hold off on antibiotics at this point given nontoxic appearance.  3. Failure to thrive, acute weight loss: Secondary to above. Dietitian consult. 4. History of ITP: Continue prednisone. Hold CellCept for tonight, discuss with Dr. Lamonte Sakai in AM.  5. History of pulmonary embolism 2013: Continue warfarin 6. Steroid-induced hyperglycemia: Currently not on any therapy per her endocrinologist Dr. Dorris Fetch. 7. Chronic kidney disease stage III: Appears stable.  Code Status: Full code  Family Communication: None present  Disposition Plan/Anticipated LOS: Admission inpatient. 2-4 days.  Time spent: 60  minutes  Murray Hodgkins, MD  [facility] Pager [phone] 01/25/2013, 4:59 PM

## 2013-01-25 NOTE — Assessment & Plan Note (Addendum)
47 y/o female with complicated PMHx of ITP on prednisone/Cellcept, PE on Coumadin who presents with acute onset N/V/D of approximately two weeks duration. She has been evaluated in ER on two occasions in the last one week. She continues to feel poorly. Reports 10 stools daily. No vomiting this week but unable to eat. C/O early satiety and severe nausea with little po intake. Work-up in ER (4 days ago) revealed new onset mild transaminitis. Creatinine at her baseline of 1.5. Hyponatremia with sodium of 132. Noncontrast CT of the abdomen showed small bowel malrotation but no obstruction or volvulus. Liver appeared normal. Symptoms seem to occurred after eating scrambled egg but time frame not entirely consistent with Salmonella. Suspect infectious etiology complicated by immunosuppressant status.  We have tried to get orthostatics on the patient. Could not hear standing BP with multiple attempts with various cuffs/manual and automatic. BP lying and sitting both 120s/98. Patient c/o lightheadedness and severe weakness. Discussed with Dr. Gala Romney. Recommended admission using hospitalist service.   Recommend stool for culture, C. difficile PCR, Giardia/Cryptosporidium. Basic metabolic panel, LFTs, CBC. Zofran refill, 8 mg by mouth Q8 hours when necessary. Since patient is going to be admitted, we can give prn orders. Add PPI for reactive gastropathy. Clear liquid diet, advance as tolerated.

## 2013-01-25 NOTE — Progress Notes (Signed)
Cc PCP  DR. HA

## 2013-01-25 NOTE — Progress Notes (Signed)
Primary Care Physician: Glo Herring., MD Hematologist: Dr. Sherryl Manges Primary Gastroenterologist:  Garfield Cornea, MD   Chief Complaint  Patient presents with  . Abdominal Pain    HPI: Summer Cook is a 47 y.o. female here for urgent followup visit for persistent nausea, vomiting, diarrhea. She was initially seen back on 12/26/2012 for rectal bleeding, abdominal pain. There was a question of melena as well. History is complicated by ITP, currently managed by Dr. Lamonte Sakai. She is on CellCept and prednisone and her dosages have been altered quite a bit lately for recurrent thrombocytopenia. She is also maintained on Coumadin for history of pulmonary embolus in August of 2013.  She has a history of intermittent rectal bleeding, felt to be due to hemorrhoids on recent colonoscopy. She has a history of frequent heartburn, epigastric discomfort on chronic prednisone. Intermittent nausea. Recent EGD showed reactive gastropathy without H. Pylori. She is not on a PPI.  She has been evaluated in the emergency department on a couple occasions recently 4/23 and 4/26 for vomiting and diarrhea. Noncontrast CT done on 01/21/2013 showed no evidence of bowel obstruction and nothing to explain her symptoms She had small bowel malrotation proximally but no obstruction or volvulus. Labs showed elevated AST of 52 and ALT of 78. Her white blood cell count and hemoglobin were normal. Platelet count of 236,000. Creatinine 1.5, BUN 21, sodium 132. Previous viral markers in 2011 were negative for hepatitis A, B, and C. Liver appeared normal on abdominal ultrasound in March of 2013.  Patient reports her N/V/D began a couple of days after her EGD/TCS. She ate scrambled eggs and a biscuit the day after her procedure and before she could get home she started having vomiting and diarrhea. He Aunt was sick with similar symptoms but lasted only two days. For two weeks she has felt "deathly sick". She has had solid food only on two  occasions. Today she had two English muffins with jelly. Every time she eats she feels very full and nauseated. She has used Zofran ATC given to her by ER. Band like crampy abdominal pain followed by diarrhea. Last vomited last week. This week more diarrhea. Feels very nauseated. No melena, very little brbpr (no more than she had been having). Nocturnal diarrhea. BM more than ten per day.   Dr. Lamonte Sakai raised prednisone after procedure from 30 to 50mg  daily. She was on Cellcept 1000mg  in AM and 500mg  in PM. Patient stopped Cellcept on her own five days ago to see if symptoms were related to the medication. No recent antibiotics.  She completed Diflucan recently for candida esophagitis.   Weight down 12 pounds since 12/26/12.  Current Outpatient Prescriptions  Medication Sig Dispense Refill  . albuterol (PROVENTIL HFA;VENTOLIN HFA) 108 (90 BASE) MCG/ACT inhaler Inhale 2 puffs into the lungs every 6 (six) hours as needed for wheezing.      Marland Kitchen ALPRAZolam (XANAX) 1 MG tablet Take 1 mg by mouth 3 (three) times daily as needed for anxiety.       . Cholecalciferol (VITAMIN D) 2000 UNITS CAPS Take 1 capsule by mouth daily.      Marland Kitchen escitalopram (LEXAPRO) 20 MG tablet Take 20 mg by mouth daily.      Marland Kitchen HYDROcodone-acetaminophen (NORCO/VICODIN) 5-325 MG per tablet Take 1 tablet by mouth every 6 (six) hours as needed for pain.  15 tablet  0  . mycophenolate (CELLCEPT) 500 MG tablet Take 500-1,000 mg by mouth 2 (two) times daily. Two in the morning  and 1 in the evening      . ondansetron (ZOFRAN) 8 MG tablet Take 1 tablet (8 mg total) by mouth every 4 (four) hours as needed for nausea.  10 tablet  0  . predniSONE (DELTASONE) 10 MG tablet Take 50 mg by mouth daily.       Marland Kitchen triamterene-hydrochlorothiazide (DYAZIDE) 37.5-25 MG per capsule Take 1 capsule by mouth daily.      . vitamin B-12 (CYANOCOBALAMIN) 1000 MCG tablet Take 2,000 mcg by mouth 2 (two) times daily.       Marland Kitchen warfarin (COUMADIN) 1 MG tablet Take 2-3 mg by  mouth as directed. Takes 3 mg per night      . diphenoxylate-atropine (LOMOTIL) 2.5-0.025 MG per tablet Take 1 tablet by mouth 4 (four) times daily as needed for diarrhea or loose stools.  20 tablet  0  . [DISCONTINUED] prochlorperazine (COMPAZINE) 10 MG tablet Take 10 mg by mouth as needed. For nausea        No current facility-administered medications for this visit.    Allergies as of 01/25/2013 - Review Complete 01/25/2013  Allergen Reaction Noted  . Yellow jacket venom Swelling 08/19/2011   Past Medical History  Diagnosis Date  . Essential hypertension, benign   . 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  Last was Prednisone 40mg ; 2 wk in 10/2012.  She also was on Prednisone bridged with Cellcept briefly but stopped due to lack of insurance.   . Depression   . Thrush, oral 05/29/2011  . Steroid-induced diabetes   . Pulmonary embolism 04/2012  . Asthma   . Renal insufficiency    Past Surgical History  Procedure Laterality Date  . Cholecystectomy  2008  . Bone marrow biopsy    . 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 Colonoscopy with esophagogastroduodenoscopy (egd) N/A 01/04/2013    Dr. Gala Romney: esophageal plaques with +KOH, hh. Gastric antrum abnormal, bx reactive gastropathy. Anal canal hemorrhoids, colonic tics, normal TI, single polyp (sessile serrated tubular adenoma). Next TCS 12/2017   Family History  Problem Relation Age of Onset  . Cervical cancer Mother   . Lung cancer Father   . Pneumonia Brother   . Cancer Paternal Grandmother 64    colon cancer, breast cancer  . Cancer Paternal Grandfather 72    colon cancer  . AAA (abdominal aortic aneurysm) Paternal Grandmother    History  Substance Use Topics  . Smoking status: Former Smoker    Types: Cigarettes    Quit date: 02/15/2011  . Smokeless tobacco: Never Used  . Alcohol Use: No    ROS:  General: Negative for  fever. C/O fatigue, weakness. Weight is down 12 pounds since 12/26/2012 ENT: Negative for hoarseness, difficulty swallowing , nasal congestion. CV: Negative for chest pain, angina, palpitations, dyspnea on exertion, peripheral edema.  Respiratory: Negative for dyspnea at rest, dyspnea on exertion, cough, sputum, wheezing.  GI: See history of present illness. GU:  Negative for dysuria, hematuria, urinary incontinence, urinary frequency, nocturnal urination.  Endo: see hpi.    Physical Examination:   BP 138/99  Pulse 127  Temp(Src) 97.4 F (36.3 C) (Oral)  Ht 5\' 4"  (1.626 m)  Wt 203 lb 9.6 oz (92.352 kg)  BMI 34.93 kg/m2  General: Well-nourished, well-developed appears miserable. Weight is down from 215lb on 12/26/12. Accompanied by aunt. Eyes: No icterus. Mouth: Oropharyngeal mucosa dry and pink , no lesions erythema or exudate. Lungs:  Clear to auscultation bilaterally.  Heart: Regular rate and rhythm, no murmurs rubs or gallops.  Abdomen: Bowel sounds are normal, moderate epigastric tenderness, nondistended, no hepatosplenomegaly or masses, no abdominal bruits or hernia , no rebound or guarding.   Extremities: No lower extremity edema. No clubbing or deformities. Neuro: Alert and oriented x 4   Skin: Warm and dry, no jaundice.   Psych: Alert and cooperative, normal mood and  affect.  Labs:  Lab Results  Component Value Date   WBC 10.3 01/23/2013   HGB 14.0 01/23/2013   HCT 43.0 01/23/2013   MCV 72.8* 01/23/2013   PLT 236 01/23/2013   Lab Results  Component Value Date   CREATININE 1.50* 01/21/2013   BUN 21 01/21/2013   NA 132* 01/21/2013   K 3.7 01/21/2013   CL 93* 01/21/2013   CO2 27 01/21/2013   Lab Results  Component Value Date   ALT 78* 01/21/2013   AST 52* 01/21/2013   ALKPHOS 49 01/21/2013   BILITOT 0.5 01/21/2013   Lab Results  Component Value Date   HEPAIGM NEGATIVE 08/29/2010   HEPBIGM  Value: NEGATIVE (NOTE) High levels of Hepatitis B Core IgM antibody are detectable  during the acute stage of Hepatitis B. This antibody is used to differentiate current from past HBV infection.  08/29/2010   Hep B S Ab: negative Hep B surface Ag: negative HCV Ab: negative Ferritin 133 (08/2010) Lab Results  Component Value Date   ANA NEGATIVE 02/03/2011    Imaging Studies: Ct Abdomen Pelvis Wo Contrast  01/21/2013  *RADIOLOGY REPORT*  Clinical Data: 47 year old female with abdominal pelvic pain. History of splenectomy.  CT ABDOMEN AND PELVIS WITHOUT CONTRAST  Technique:  Multidetector CT imaging of the abdomen and pelvis was performed following the standard protocol without intravenous contrast.  Comparison: 10/08/2010 CT  Findings: The lung bases are clear.  The liver, pancreas, kidneys and adrenal glands are unremarkable. The patient is status post cholecystectomy and splenectomy.  Please note that parenchymal abnormalities may be missed as intravenous contrast was not administered.  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 No other bowel abnormalities are identified. The bladder is unremarkable.  No free fluid, enlarged lymph nodes, biliary dilation or abdominal aortic aneurysm identified.  There is no evidence of pneumoperitoneum or abscess. No acute or suspicious bony abnormalities are identified. A superior endplate compression fracture of T12 is remote.  IMPRESSION: No evidence of acute abnormality.   Original Report Authenticated By: Margarette Canada, M.D.      Impression/Plan:  47 y/o female with complicated PMHx of ITP on prednisone/Cellcept, PE on Coumadin who presents with acute onset N/V/D of approximately two weeks duration. She has been evaluated in ER on two occasions in the last one week. She continues to feel poorly. Reports 10 stools daily. No vomiting this week but unable to eat. C/O early satiety and severe nausea with little po intake. Work-up in ER (4 days ago) revealed new onset mild transaminitis.  Creatinine at her baseline of 1.5. Hyponatremia with sodium of 132. Noncontrast CT of the abdomen showed small bowel malrotation but no obstruction or volvulus. Liver appeared normal. Symptoms seem to occurred after eating scrambled egg but time frame not entirely consistent with Salmonella. Suspect infectious etiology complicated by immunosuppressant status.  We have tried to get orthostatics on the patient. Could not hear standing BP with multiple attempts with various cuffs/manual and automatic. BP lying and sitting both 120s/98. Patient c/o  lightheadedness and severe weakness. Discussed with Dr. Gala Romney. Recommended admission using hospitalist service.   Recommend stool for culture, C. difficile PCR, Giardia/Cryptosporidium. Basic metabolic panel, LFTs, CBC. Zofran refill, 8 mg by mouth Q8 hours when necessary. Since patient is going to be admitted, we can give prn orders. Add PPI for reactive gastropathy. Clear liquid diet, advance as tolerated.

## 2013-01-26 ENCOUNTER — Telehealth: Payer: Self-pay | Admitting: *Deleted

## 2013-01-26 ENCOUNTER — Encounter (HOSPITAL_COMMUNITY): Payer: Self-pay | Admitting: Dietician

## 2013-01-26 DIAGNOSIS — R1013 Epigastric pain: Secondary | ICD-10-CM

## 2013-01-26 DIAGNOSIS — R748 Abnormal levels of other serum enzymes: Secondary | ICD-10-CM

## 2013-01-26 DIAGNOSIS — R112 Nausea with vomiting, unspecified: Secondary | ICD-10-CM

## 2013-01-26 DIAGNOSIS — R197 Diarrhea, unspecified: Secondary | ICD-10-CM

## 2013-01-26 LAB — CBC
HCT: 37.1 % (ref 36.0–46.0)
Hemoglobin: 12 g/dL (ref 12.0–15.0)
MCH: 23.5 pg — ABNORMAL LOW (ref 26.0–34.0)
MCV: 72.6 fL — ABNORMAL LOW (ref 78.0–100.0)
RBC: 5.11 MIL/uL (ref 3.87–5.11)

## 2013-01-26 LAB — PROTIME-INR
INR: 3.71 — ABNORMAL HIGH (ref 0.00–1.49)
Prothrombin Time: 34.6 seconds — ABNORMAL HIGH (ref 11.6–15.2)

## 2013-01-26 LAB — COMPREHENSIVE METABOLIC PANEL
AST: 35 U/L (ref 0–37)
Albumin: 2.9 g/dL — ABNORMAL LOW (ref 3.5–5.2)
Calcium: 8 mg/dL — ABNORMAL LOW (ref 8.4–10.5)
Creatinine, Ser: 1.11 mg/dL — ABNORMAL HIGH (ref 0.50–1.10)
Total Protein: 5.1 g/dL — ABNORMAL LOW (ref 6.0–8.3)

## 2013-01-26 MED ORDER — TRIAMTERENE-HCTZ 37.5-25 MG PO TABS
1.0000 | ORAL_TABLET | Freq: Every day | ORAL | Status: DC
  Administered 2013-01-26 – 2013-01-27 (×2): 1 via ORAL
  Filled 2013-01-26 (×2): qty 1

## 2013-01-26 MED ORDER — WARFARIN - PHARMACIST DOSING INPATIENT: Status: DC

## 2013-01-26 MED ORDER — BOOST / RESOURCE BREEZE PO LIQD
1.0000 | Freq: Two times a day (BID) | ORAL | Status: DC
  Administered 2013-01-26: 1 via ORAL

## 2013-01-26 NOTE — Progress Notes (Signed)
ANTICOAGULATION CONSULT NOTE - Initial Consult  Pharmacy Consult for Warfarin Indication:  VTE treatment - history of PE  Allergies  Allergen Reactions  . Yellow Jacket Venom Swelling    Patient Measurements: Height: 5\' 4"  (162.6 cm) Weight: 203 lb 4.2 oz (92.2 kg) IBW/kg (Calculated) : 54.7   Vital Signs: Temp: 97.9 F (36.6 C) (05/01 0639) Temp src: Oral (05/01 0639) BP: 123/80 mmHg (05/01 0639) Pulse Rate: 68 (05/01 0639)  Labs:  Recent Labs  01/25/13 1652 01/26/13 0548  HGB 13.1 12.0  HCT 40.2 37.1  PLT 256 198  LABPROT 33.9* 34.6*  INR 3.61* 3.71*  CREATININE 1.39* 1.11*    Estimated Creatinine Clearance: 68.9 ml/min (by C-G formula based on Cr of 1.11).   Medical History: Past Medical History  Diagnosis Date  . Essential hypertension, benign   . 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  Last was Prednisone 40mg ; 2 wk in 10/2012.  She also was on Prednisone bridged with Cellcept briefly but stopped due to lack of insurance.   . Depression   . Thrush, oral 05/29/2011  . Steroid-induced diabetes   . Pulmonary embolism 04/2012  . Asthma   . Renal insufficiency     Medications:   Medications Prior to Admission  Medication Sig Dispense Refill  . ALPRAZolam (XANAX) 1 MG tablet Take 1 mg by mouth 3 (three) times daily as needed for anxiety.       . Cholecalciferol (VITAMIN D) 2000 UNITS CAPS Take 1 capsule by mouth daily.      . diphenoxylate-atropine (LOMOTIL) 2.5-0.025 MG per tablet Take 1 tablet by mouth 4 (four) times daily as needed for diarrhea or loose stools.  20 tablet  0  . escitalopram (LEXAPRO) 20 MG tablet Take 20 mg by mouth daily.      Marland Kitchen HYDROcodone-acetaminophen (NORCO/VICODIN) 5-325 MG per tablet Take 1 tablet by mouth every 6 (six) hours as needed for pain.  15 tablet  0  . ondansetron (ZOFRAN) 8 MG tablet Take 1 tablet (8 mg total)  by mouth every 8 (eight) hours as needed for nausea.  20 tablet  0  . predniSONE (DELTASONE) 10 MG tablet Take 50 mg by mouth daily.       Marland Kitchen triamterene-hydrochlorothiazide (DYAZIDE) 37.5-25 MG per capsule Take 1 capsule by mouth daily.      . vitamin B-12 (CYANOCOBALAMIN) 1000 MCG tablet Take 2,000 mcg by mouth 2 (two) times daily.       Marland Kitchen warfarin (COUMADIN) 1 MG tablet Take 3 mg by mouth daily.      Marland Kitchen albuterol (PROVENTIL HFA;VENTOLIN HFA) 108 (90 BASE) MCG/ACT inhaler Inhale 2 puffs into the lungs every 6 (six) hours as needed for wheezing.      . mycophenolate (CELLCEPT) 500 MG tablet Take 500-1,000 mg by mouth 2 (two) times daily. Two in the morning and 1 in the evening       Assessment: Patient on Coumadin at home for history of PE. INR remains elevated.  Goal of Therapy:  INR 2-3   Plan:  Hold Coumadin due to elevated INR INR / PT daily  Pricilla Larsson 01/26/2013,10:02 AM

## 2013-01-26 NOTE — Telephone Encounter (Signed)
Call from Nurse Practioner, Dyanne Carrel,  at [facility].  States pt admitted for n/v/d and abd pain.  They held her Cellcept on admission and ask if Dr. Lamonte Sakai ok w/ holding med for now and any other instructions?  Platelet count 198 today. [name]'s pager number [phone].  Per Dr. Lamonte Sakai D/C the Cellcept until symptoms resolve and then he will resume it back slowly. Called [name] back and informed of Dr. Agustina Caroli instructions.  Asked her to notify us on pt d/c so we can f/u.

## 2013-01-26 NOTE — Progress Notes (Signed)
INITIAL NUTRITION ASSESSMENT  DOCUMENTATION CODES Per approved criteria  -Severe malnutrition in the context of acute illness or injury   INTERVENTION:  Resource Breeze po BID, each supplement provides 250 kcal and 9 grams of protein.  RD will follow for diet advancement and evaluate adequacy   NUTRITION DIAGNOSIS: Malnutrition related to altered GI tract function as evidenced by  Daily Diarrhea, abdominal pain past 2 weeks with very poor po intake and unintentional 12#, 5% wt loss.   Goal: Pt to meet >/= 90% of their estimated nutrition needs  Monitor:  Po intake, labs and wt trends  Reason for Assessment: Consult poor po intake, Malnutrition Screen Score=2  47 y.o. female  Admitting Dx: Diarrhea  ASSESSMENT: Diet hx reviewed. Pt reports poor po intake </=50% for >/= 5 days and 5% wt loss 14 days. Etiology unknown at this time. FTT, Orthostatic hypotension and dehydrated at admission.  Followed by Dr.Rourk's team. Colonoscopy with EGD on 01/04/13 due to rectal bleeding. Colonic polyp removed.  Height: Ht Readings from Last 1 Encounters:  01/25/13 5\' 4"  (1.626 m)    Weight: Wt Readings from Last 1 Encounters:  01/25/13 203 lb 4.2 oz (92.2 kg)    Ideal Body Weight: 120# (54.5 kg)  % Ideal Body Weight: 169%  Wt Readings from Last 10 Encounters:  01/25/13 203 lb 4.2 oz (92.2 kg)  01/25/13 203 lb 9.6 oz (92.352 kg)  01/18/13 204 lb (92.534 kg)  12/26/12 215 lb 12.8 oz (97.886 kg)  12/22/12 208 lb (94.348 kg)  12/07/12 202 lb 12.8 oz (91.989 kg)  11/12/12 205 lb (92.987 kg)  09/08/12 210 lb (95.255 kg)  07/29/12 206 lb (93.441 kg)  06/27/12 206 lb (93.441 kg)    Usual Body Weight: 215#  % Usual Body Weight: 95%  BMI:  Body mass index is 34.87 kg/(m^2). Obesity Class I  Estimated Nutritional Needs: Kcal: 1545-1775 Protein:85-95 gr Fluid: >2000 ml/day  Skin: No issues noted  Diet Order: Clear Liquid  EDUCATION NEEDS: -No education needs identified at  this time   Intake/Output Summary (Last 24 hours) at 01/26/13 0953 Last data filed at 01/26/13 0910  Gross per 24 hour  Intake 2087.5 ml  Output      0 ml  Net 2087.5 ml    Last BM: 01/25/13   Labs:   Recent Labs Lab 01/21/13 1310 01/25/13 1652 01/26/13 0548  NA 132* 132* 135  K 3.7 3.1* 3.4*  CL 93* 94* 100  CO2 27 27 26   BUN 21 26* 19  CREATININE 1.50* 1.39* 1.11*  CALCIUM 8.6 8.5 8.0*  GLUCOSE 157* 126* 110*    CBG (last 3)  No results found for this basename: GLUCAP,  in the last 72 hours  Scheduled Meds: . escitalopram  20 mg Oral Daily  . pantoprazole (PROTONIX) IV  40 mg Intravenous Q24H  . predniSONE  50 mg Oral Daily  . sodium chloride  3 mL Intravenous Q12H  . triamterene-hydrochlorothiazide  1 tablet Oral Daily    Continuous Infusions: . sodium chloride 150 mL/hr at 01/25/13 [id number]    Past Medical History  Diagnosis Date  . Essential hypertension, benign   . 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  Last was Prednisone 40mg ; 2 wk in 10/2012.  She also was on Prednisone bridged with Cellcept briefly but stopped due to lack of insurance.   . Depression   . Thrush, oral 05/29/2011  .  Steroid-induced diabetes   . Pulmonary embolism 04/2012  . Asthma   . Renal insufficiency     Past Surgical History  Procedure Laterality Date  . Cholecystectomy  2008  . Bone marrow biopsy    . 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 Colonoscopy with esophagogastroduodenoscopy (egd) N/A 01/04/2013    Dr. Gala Romney: esophageal plaques with +KOH, hh. Gastric antrum abnormal, bx reactive gastropathy. Anal canal hemorrhoids, colonic tics, normal TI, single polyp (sessile serrated tubular adenoma). Next TCS 12/2017    Colman Cater MS,RD,LDN,CSG Office: [phone] Pager: [phone]

## 2013-01-26 NOTE — Progress Notes (Signed)
Subjective:  She ate 100% of evening meal and breakfast (clear liquids). No stools collected for studies as she has not had one since admission. Took Pepto and Imodium yesterday. Still c/o epigastric discomfort and fullness. No vomiting.   Objective: Vital signs in last 24 hours: Temp:  [97.4 F (36.3 C)-97.9 F (36.6 C)] 97.9 F (36.6 C) (05/01 0639) Pulse Rate:  [68-108] 68 (05/01 0639) Resp:  [18] 18 (05/01 0639) BP: (122-138)/(80-99) 123/80 mmHg (05/01 0639) SpO2:  [95 %-98 %] 98 % (05/01 0639) Weight:  [203 lb 4.2 oz (92.2 kg)-203 lb 9.6 oz (92.352 kg)] 203 lb 4.2 oz (92.2 kg) (04/30 1810) Last BM Date: 01/25/13 General:   Alert,  Well-developed, well-nourished, pleasant and cooperative in NAD Head:  Normocephalic and atraumatic. Eyes:  Sclera clear, no icterus.   Abdomen:  Soft, mild epigastric discomfort and nondistended.  Normal bowel sounds, without guarding, and without rebound.   Extremities:  Without clubbing, deformity or edema. Neurologic:  Alert and  oriented x4;  grossly normal neurologically. Skin:  Intact without significant lesions or rashes. Psych:  Alert and cooperative. Normal mood and affect.  Intake/Output from previous day: 04/30 0701 - 05/01 0700 In: 1847.5 [P.O.:240; I.V.:1607.5] Out: -  Intake/Output this shift: Total I/O In: 240 [P.O.:240] Out: -   Lab Results: CBC  Recent Labs  01/25/13 1652 01/26/13 0548  WBC 8.5 7.1  HGB 13.1 12.0  HCT 40.2 37.1  MCV 71.9* 72.6*  PLT 256 198   BMET  Recent Labs  01/25/13 1652 01/26/13 0548  NA 132* 135  K 3.1* 3.4*  CL 94* 100  CO2 27 26  GLUCOSE 126* 110*  BUN 26* 19  CREATININE 1.39* 1.11*  CALCIUM 8.5 8.0*   LFTs  Recent Labs  01/25/13 1652 01/26/13 0548  BILITOT 0.5 0.4  ALKPHOS 47 38*  AST 57* 35  ALT 145* 110*  PROT 6.1 5.1*  ALBUMIN 3.5 2.9*    Recent Labs  01/25/13 1652  LIPASE 68*   PT/INR  Recent Labs  01/25/13 1652 01/26/13 0548  LABPROT 33.9* 34.6*  INR  3.61* 3.71*      Imaging Studies: Ct Abdomen Pelvis Wo Contrast  01/21/2013  *RADIOLOGY REPORT*  Clinical Data: 47 year old female with abdominal pelvic pain. History of splenectomy.  CT ABDOMEN AND PELVIS WITHOUT CONTRAST  Technique:  Multidetector CT imaging of the abdomen and pelvis was performed following the standard protocol without intravenous contrast.  Comparison: 10/08/2010 CT  Findings: The lung bases are clear.  The liver, pancreas, kidneys and adrenal glands are unremarkable. The patient is status post cholecystectomy and splenectomy.  Please note that parenchymal abnormalities may be missed as intravenous contrast was not administered.  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 No other bowel abnormalities are identified. The bladder is unremarkable.  No free fluid, enlarged lymph nodes, biliary dilation or abdominal aortic aneurysm identified.  There is no evidence of pneumoperitoneum or abscess. No acute or suspicious bony abnormalities are identified. A superior endplate compression fracture of T12 is remote.  IMPRESSION: No evidence of acute abnormality.   Original Report Authenticated By: Margarette Canada, M.D.   [2 weeks]   Assessment: 47 year old female with complicated past medical history of ITP on prednisone/CellCept, pulmonary embolus on Coumadin, who presented with acute onset nausea, vomiting, diarrhea of approximately 2 weeks' duration. Admitted yesterday after having 2 prior ER visits related to dehydration and ongoing symptoms. Also with new onset mild transaminitis, hyponatremia,  hypokalemia.  No diarrhea since admission. Tolerating clear liquids. Her AST/ALT are improved over past 24 hours. Patient noted to have mildly elevated lipase in setting of chronic renal insufficiency and felt to be nonspecific finding. Noncontrast CT on 01/21/13 without evidence of liver or pancreatic abnormality and she is s/p  cholecystectomy.  Plan: 1. Repeat LFTs, lipase in AM. 2. Collect stool for studies as available. 3. Agree with advancing diet.  We appreciate Dr. Holli Humbles assistance with this nice lady.   LOS: 1 day   Neil Crouch  01/26/2013, 10:03 AM

## 2013-01-26 NOTE — Progress Notes (Signed)
Patient seen, independently examined and chart reviewed. I agree with exam, assessment and plan discussed with Dyanne Carrel, NP.  Subjective: Feels a little better today. Ambulating without significant dizziness. No vomiting. No diarrhea since admission.  Objective: Afebrile, vitals stable. Abdomen soft, nontender.  Labs: AST now normal. ALT down to 110. Normal bilirubin. Minimally elevated lipase yesterday 68. CBC stable. INR 3.71.  Acute issues:  Orthostatic hypotension, dehydration  Two-week history of vomiting, diarrhea, abdominal pain  Failure to thrive, acute weight loss  History of ITP  History of pulmonary embolism 2013  Plan:  Continue IV fluids. Followup orthostatics.  Further evaluation per gastroenterology.  Discontinue CellCept.  Summary: 47 year old woman presented to the gastroenterology office today for followup with complaint of two-week history of diarrhea and abdominal pain. Although nontoxic in appearance was noted to be profoundly orthostatic. 12 pound weight loss documented and referred directly for admission for further evaluation.  Murray Hodgkins, MD [facility] [phone]

## 2013-01-26 NOTE — Progress Notes (Signed)
Pt was identified for weight loss on [facility] Nutrition Screen.   Wt Readings from Last 30 Encounters:  01/25/13 203 lb 4.2 oz (92.2 kg)  01/25/13 203 lb 9.6 oz (92.352 kg)  01/18/13 204 lb (92.534 kg)  12/26/12 215 lb 12.8 oz (97.886 kg)  12/22/12 208 lb (94.348 kg)  12/07/12 202 lb 12.8 oz (91.989 kg)  11/12/12 205 lb (92.987 kg)  09/08/12 210 lb (95.255 kg)  07/29/12 206 lb (93.441 kg)  06/27/12 206 lb (93.441 kg)  06/21/12 206 lb (93.441 kg)  05/03/12 207 lb (93.895 kg)  04/08/12 201 lb 4.8 oz (91.309 kg)  12/24/11 193 lb (87.544 kg)  12/18/11 193 lb 11.2 oz (87.862 kg)  12/12/11 190 lb (86.183 kg)  11/30/11 190 lb 14.4 oz (86.592 kg)  11/09/11 192 lb 9.6 oz (87.363 kg)  10/07/11 183 lb 1.6 oz (83.054 kg)  08/24/11 187 lb 4.8 oz (84.959 kg)  08/19/11 190 lb (86.183 kg)  06/05/11 207 lb 14.3 oz (94.3 kg)  06/04/11 193 lb 3.2 oz (87.635 kg)  05/31/11 188 lb 9.6 oz (85.548 kg)  05/08/11 187 lb 3.2 oz (84.913 kg)  04/30/11 190 lb (86.183 kg)  04/28/11 188 lb 9.6 oz (85.548 kg)  04/21/11 184 lb 6.4 oz (83.643 kg)  04/17/11 181 lb 9.6 oz (82.373 kg)   Chart reviewed. Pt resides in [place], but is a pt at [facility]. 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  Noted hx of progressive wt gain since 2012. UBW: 190-195#. Wt changes reveal 1# (0%) wt loss x 1 week, 12# (5.5%) wt loss x 1 month (which is clinically signigicant), 3# (1.4%) wt loss x 6 months, and 10# (5.1%) wt gain x 1 year.  Noted with with hx of poor appetite. She is currently an inpatient at [facility] with dx: diarrhea, abdominal pain, direct admit from Dr. Gala Romney (gastroenterolgy) on 01/25/13. Inpatient RD assessed on 01/26/13, which revealed severe malnutrition in the context of acute illness r/t poor po intake and clinically significant wt loss x 1 month. Agree with inpt RD's dx of malnutrition. Noted pt has been ordered [facility] BID as an inpatient and is currently on a clear liquid diet.   Recommend pt continue current  nutrition POC.Will defer short term nutrition management to inpatient RD. WIll follow-up with pt after discharge.   Joaquim Lai, RD, LDN Pager: [phone]

## 2013-01-26 NOTE — Progress Notes (Signed)
[facility] PROGRESS NOTE  Summer Cook [id number] DOB: Mar 06, 1966 DOA: 01/25/2013 PCP: Glo Herring., MD  Assessment/Plan: 1. Orthostatic hypotension, dehydration: Secondary to diarrhea and poor oral intake. Repeat orthostatics pending. Continue IV fluids. No diarrhea/vomiting since admission.  2. Two-week history of vomiting, diarrhea, abdominal pain: Etiology unclear. CT abdomen and pelvis 4/26 was unremarkable. Exam benign. No recent leukocytosis.Stool culture, further stool studies per gastroenterology. May be related to CellCept. Spoke with Dr. Agustina Caroli office recommended discontinuing and having pt follow up with him at discharge. Pt reports stopping med 5 days ago.Apprecieate GI assistance  3. Failure to thrive, acute weight loss: Secondary to above. Dietitian consult. 4. History of ITP: Continue prednisone. Continue to hold CellCept. Spoke with Dr Agustina Caroli office recommended discontinuing for now.   5. History of pulmonary embolism 2013: Continue warfarin 6. Steroid-induced hyperglycemia: Currently not on any therapy per her endocrinologist Dr. Dorris Fetch. CBG 110 7. Chronic kidney disease stage III:  Creatinine trending down.     Code Status: full Family Communication:  Disposition Plan: home hopefully tomorrow   Consultants:  GI  Procedures:  none  Antibiotics:  none  HPI/Subjective: Awake continues with abdominal pain and feeling of fullness.   Objective: Filed Vitals:   01/25/13 1810 01/25/13 1909 01/25/13 2257 01/26/13 0639  BP:  135/88 130/95 123/80  Pulse:  104 68 68  Temp:  97.6 F (36.4 C) 97.9 F (36.6 C) 97.9 F (36.6 C)  TempSrc:  Oral Oral Oral  Resp:  18 18 18   Height: 5\' 4"  (1.626 m)     Weight: 92.2 kg (203 lb 4.2 oz)     SpO2:  98% 95% 98%    Intake/Output Summary (Last 24 hours) at 01/26/13 1105 Last data filed at 01/26/13 0910  Gross per 24 hour  Intake 2087.5 ml  Output      0 ml  Net 2087.5 ml   Filed Weights   01/25/13 1810   Weight: 92.2 kg (203 lb 4.2 oz)    Exam:   General:  Obese NAD  Cardiovascular: RRR No MGR No LE edema  Respiratory: normal effort BSCTAB no wheeze no rhonchi  Abdomen: round, soft sluggish BS mild tenderness epigastric area. No guarding  Musculoskeletal: no clubbing no cyanosis   Data Reviewed: Basic Metabolic Panel:  Recent Labs Lab 01/21/13 1310 01/25/13 1652 01/26/13 0548  NA 132* 132* 135  K 3.7 3.1* 3.4*  CL 93* 94* 100  CO2 27 27 26   GLUCOSE 157* 126* 110*  BUN 21 26* 19  CREATININE 1.50* 1.39* 1.11*  CALCIUM 8.6 8.5 8.0*   Liver Function Tests:  Recent Labs Lab 01/21/13 1310 01/25/13 1652 01/26/13 0548  AST 52* 57* 35  ALT 78* 145* 110*  ALKPHOS 49 47 38*  BILITOT 0.5 0.5 0.4  PROT 6.6 6.1 5.1*  ALBUMIN 3.8 3.5 2.9*    Recent Labs Lab 01/25/13 1652  LIPASE 68*   No results found for this basename: AMMONIA,  in the last 168 hours CBC:  Recent Labs Lab 01/21/13 1310 01/23/13 0947 01/25/13 1652 01/26/13 0548  WBC 8.7 10.3 8.5 7.1  NEUTROABS 7.8*  --   --   --   HGB 14.4 14.0 13.1 12.0  HCT 44.8 43.0 40.2 37.1  MCV 73.2* 72.8* 71.9* 72.6*  PLT 201 236 256 198   Cardiac Enzymes: No results found for this basename: CKTOTAL, CKMB, CKMBINDEX, TROPONINI,  in the last 168 hours BNP (last 3 results) No results found for this basename: PROBNP,  in the last 8760 hours CBG: No results found for this basename: GLUCAP,  in the last 168 hours  No results found for this or any previous visit (from the past 240 hour(s)).   Studies: No results found.  Scheduled Meds: . escitalopram  20 mg Oral Daily  . pantoprazole (PROTONIX) IV  40 mg Intravenous Q24H  . predniSONE  50 mg Oral Daily  . sodium chloride  3 mL Intravenous Q12H  . triamterene-hydrochlorothiazide  1 tablet Oral Daily  . Warfarin - Pharmacist Dosing Inpatient   Does not apply Q24H   Continuous Infusions: . sodium chloride 150 mL/hr at 01/25/13 [id number]    Principal Problem:    Diarrhea Active Problems:   ITP (idiopathic thrombocytopenic purpura)   Drug-induced hyperglycemia   Post-splenectomy   PE (pulmonary embolism)   Abdominal pain, epigastric   Chronic anticoagulation   Orthostatic hypotension    Time spent: 30 min    [facility] Pager [phone] If 7PM-7AM, please contact night-coverage at [email or web address], password [facility] 01/26/2013, 11:05 AM  LOS: 1 day

## 2013-01-27 ENCOUNTER — Other Ambulatory Visit: Payer: Self-pay | Admitting: Gastroenterology

## 2013-01-27 ENCOUNTER — Telehealth: Payer: Self-pay | Admitting: Gastroenterology

## 2013-01-27 DIAGNOSIS — R1013 Epigastric pain: Secondary | ICD-10-CM

## 2013-01-27 DIAGNOSIS — R945 Abnormal results of liver function studies: Secondary | ICD-10-CM

## 2013-01-27 LAB — BASIC METABOLIC PANEL
Chloride: 103 mEq/L (ref 96–112)
Creatinine, Ser: 1.08 mg/dL (ref 0.50–1.10)
GFR calc Af Amer: 70 mL/min — ABNORMAL LOW (ref >90)

## 2013-01-27 LAB — HEPATIC FUNCTION PANEL
ALT: 94 U/L — ABNORMAL HIGH (ref 0–35)
Total Protein: 4.8 g/dL — ABNORMAL LOW (ref 6.0–8.3)

## 2013-01-27 LAB — CBC
HCT: 35.1 % — ABNORMAL LOW (ref 36.0–46.0)
Hemoglobin: 11.3 g/dL — ABNORMAL LOW (ref 12.0–15.0)
MCH: 23.7 pg — ABNORMAL LOW (ref 26.0–34.0)
MCHC: 32.2 g/dL (ref 30.0–36.0)
MCV: 73.6 fL — ABNORMAL LOW (ref 78.0–100.0)
Platelets: 220 10*3/uL (ref 150–400)
RBC: 4.77 MIL/uL (ref 3.87–5.11)
RDW: 20.1 % — ABNORMAL HIGH (ref 11.5–15.5)
WBC: 9.3 10*3/uL (ref 4.0–10.5)

## 2013-01-27 LAB — PROTIME-INR
INR: 2.73 — ABNORMAL HIGH (ref 0.00–1.49)
Prothrombin Time: 27.6 seconds — ABNORMAL HIGH (ref 11.6–15.2)

## 2013-01-27 LAB — LIPASE, BLOOD: Lipase: 108 U/L — ABNORMAL HIGH (ref 11–59)

## 2013-01-27 MED ORDER — WARFARIN SODIUM 2 MG PO TABS
3.0000 mg | ORAL_TABLET | Freq: Once | ORAL | Status: AC
  Administered 2013-01-27: 3 mg via ORAL
  Filled 2013-01-27: qty 1

## 2013-01-27 MED ORDER — PANTOPRAZOLE SODIUM 40 MG PO TBEC: 40.0000 mg | DELAYED_RELEASE_TABLET | Freq: Two times a day (BID) | ORAL | Status: DC

## 2013-01-27 MED ORDER — POTASSIUM CHLORIDE CRYS ER 20 MEQ PO TBCR
40.0000 meq | EXTENDED_RELEASE_TABLET | ORAL | Status: AC
  Administered 2013-01-27 (×2): 40 meq via ORAL
  Filled 2013-01-27 (×2): qty 2

## 2013-01-27 MED ORDER — HYDROCORTISONE ACETATE 25 MG RE SUPP
25.0000 mg | Freq: Two times a day (BID) | RECTAL | Status: DC
  Filled 2013-01-27 (×6): qty 1

## 2013-01-27 MED ORDER — PANTOPRAZOLE SODIUM 40 MG PO TBEC
40.0000 mg | DELAYED_RELEASE_TABLET | Freq: Every day | ORAL | Status: DC
  Administered 2013-01-27: 40 mg via ORAL
  Filled 2013-01-27: qty 1

## 2013-01-27 MED ORDER — HYDROCORTISONE ACETATE 25 MG RE SUPP: 25.0000 mg | Freq: Two times a day (BID) | RECTAL | Status: DC

## 2013-01-27 MED ORDER — BOOST / RESOURCE BREEZE PO LIQD: 1.0000 | Freq: Two times a day (BID) | ORAL | Status: DC

## 2013-01-27 NOTE — Discharge Summary (Signed)
Agree with discharge. See my progress note dated today for further details.  Murray Hodgkins, MD [facility] [phone]

## 2013-01-27 NOTE — Telephone Encounter (Signed)
Patient will likely be discharge today. Please arrange for her to have LFTs, lipase in one week. She is scheduled to see Dr. Lamonte Sakai Thursday for visit. ?get done at his office.   OV here in 3-4 weeks with Dr. Gala Romney only.

## 2013-01-27 NOTE — Progress Notes (Signed)
Patient seen, independently examined and chart reviewed. I agree with exam, assessment and plan discussed with Dyanne Carrel, NP.  Late entry. Patient seen earlier today.  Subjective: Feels better overall. Eating well. Reports positive bowel movement. No diarrhea. Minimal epigastric pain.  Objective: Afebrile, vital signs stable. Appears calm and comfortable. Cardiovascular regular rate and rhythm. No murmur, rub, gallop. Respiratory clear to auscultation bilaterally. No wheezes, rales, rhonchi. Normal respiratory effort. Abdomen soft.  Labs: AST normal. ALT decreased. Lipase 108. Platelet count 220. INR 2.73.  Acute issues:  Orthostatic hypotension, resolved  Dehydration, resolved  Every week history of vomiting, diarrhea: Resolved  Nonspecific elevation of lipase  History of ITP  History of pulmonary embolism  Plan:  Discharge home.  BID PPI  Followup with GI as an outpatient.  Summary: 47 year old woman presented to the gastroenterology office today for followup with complaint of two-week history of diarrhea and abdominal pain. Although nontoxic in appearance was noted to be profoundly orthostatic. 12 pound weight loss documented and referred directly for admission for further evaluation. She improved rapidly with supportive care and IV fluids. No diarrhea was noted during admission. Appetite rapidly improved, consuming full meals without difficulty. Laboratory workup was notable for mild nonspecific elevation of LFTs and lipase. Seen in consultation with gastroenterology. LFTs gradually improved. Of note recent CT without evidence of pancreatic abnormality. With symptomatic improvement as well as laboratory improvement GI recommended discharge home with outpatient followup.   Murray Hodgkins, MD [facility] [phone]

## 2013-01-27 NOTE — Progress Notes (Addendum)
[place] for Warfarin Indication:  VTE treatment - history of PE  Allergies  Allergen Reactions  . Yellow [place]et Venom Swelling    Patient Measurements: Height: 5\' 4"  (162.6 cm) Weight: 222 lb 3.6 oz (100.8 kg) IBW/kg (Calculated) : 54.7   Vital Signs: Temp: 97.3 F (36.3 C) (05/02 0445) Temp src: Oral (05/02 0445) BP: 149/89 mmHg (05/02 0445) Pulse Rate: 90 (05/02 0745)  Labs:  Recent Labs  01/25/13 1652 01/26/13 0548 01/27/13 0549  HGB 13.1 12.0 11.3*  HCT 40.2 37.1 35.1*  PLT 256 198 220  LABPROT 33.9* 34.6* 27.6*  INR 3.61* 3.71* 2.73*  CREATININE 1.39* 1.11* 1.08    Estimated Creatinine Clearance: 74.3 ml/min (by C-G formula based on Cr of 1.08).   Medical History: Past Medical History  Diagnosis Date  . Essential hypertension, benign   . 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  Last was Prednisone 40mg ; 2 wk in 10/2012.  She also was on Prednisone bridged with Cellcept briefly but stopped due to lack of insurance.   . Depression   . Thrush, oral 05/29/2011  . Steroid-induced diabetes   . Pulmonary embolism 04/2012  . Asthma   . Renal insufficiency     Medications:   Medications Prior to Admission  Medication Sig Dispense Refill  . ALPRAZolam (XANAX) 1 MG tablet Take 1 mg by mouth 3 (three) times daily as needed for anxiety.       . Cholecalciferol (VITAMIN D) 2000 UNITS CAPS Take 1 capsule by mouth daily.      . diphenoxylate-atropine (LOMOTIL) 2.5-0.025 MG per tablet Take 1 tablet by mouth 4 (four) times daily as needed for diarrhea or loose stools.  20 tablet  0  . escitalopram (LEXAPRO) 20 MG tablet Take 20 mg by mouth daily.      Marland Kitchen HYDROcodone-acetaminophen (NORCO/VICODIN) 5-325 MG per tablet Take 1 tablet by mouth every 6 (six) hours as needed for pain.  15 tablet  0  . ondansetron (ZOFRAN) 8 MG  tablet Take 1 tablet (8 mg total) by mouth every 8 (eight) hours as needed for nausea.  20 tablet  0  . predniSONE (DELTASONE) 10 MG tablet Take 50 mg by mouth daily.       Marland Kitchen triamterene-hydrochlorothiazide (DYAZIDE) 37.5-25 MG per capsule Take 1 capsule by mouth daily.      . vitamin B-12 (CYANOCOBALAMIN) 1000 MCG tablet Take 2,000 mcg by mouth 2 (two) times daily.       Marland Kitchen warfarin (COUMADIN) 1 MG tablet Take 3 mg by mouth daily.      Marland Kitchen albuterol (PROVENTIL HFA;VENTOLIN HFA) 108 (90 BASE) MCG/ACT inhaler Inhale 2 puffs into the lungs every 6 (six) hours as needed for wheezing.      . mycophenolate (CELLCEPT) 500 MG tablet Take 500-1,000 mg by mouth 2 (two) times daily. Two in the morning and 1 in the evening       Assessment: Patient on Coumadin 3mg  daily at home for history of PE. This has been held since admission for elevated INR.  INR is within goal range today.  Patient's nausea has subsides & she has been eating 100% of meal tray.  No bleeding noted.   Goal of Therapy:  INR 2-3   Plan:  Resume Coumadin 3mg  po x1 today INR / PT daily Change protonix to po per P&T policy  Cree Napoli, Lavonia Drafts 01/27/2013,10:47 AM

## 2013-01-27 NOTE — Progress Notes (Signed)
Pt discharged with instructions and prescriptions.  She verbalizes understanding.  Pt left the floor via w/c with staff in stable condition.

## 2013-01-27 NOTE — Telephone Encounter (Signed)
Lab orders done. Manuela Schwartz, please schedule ov with RMR.

## 2013-01-27 NOTE — Progress Notes (Signed)
[facility] PROGRESS NOTE  Summer Cook [id number] DOB: Feb 26, 1966 DOA: 01/25/2013 PCP: Glo Herring., MD  Assessment/Plan: Orthostatic hypotension, dehydration: Secondary to diarrhea and poor oral intake. Resolved with IV fluids. No diarrhea/vomiting since admission.   Two-week history of vomiting, diarrhea, abdominal pain: Etiology unclear. CT abdomen and pelvis 4/26 was unremarkable. Exam benign. No recent leukocytosis. CellCept discontinued. No diarrhea since admission. Continues with pain/tenderness. Lipase 108, ALT 94 and trending downward. GI following. Tolerating diet well.    Failure to thrive, acute weight loss: Secondary to above. Dietitian following [employer].   History of ITP: Continue prednisone. Continue to hold CellCept. Spoke with Dr Agustina Caroli office recommended discontinuing for now.  History of pulmonary embolism 2013: Continue warfarin per pharmacy. INR 2.73.    Steroid-induced hyperglycemia: CBG 105. Currently not on any therapy per her endocrinologist Dr. Dorris Fetch.    Chronic kidney disease stage III: Creatinine continues to trend down.   Hypokalemia: likely related to decreased po intake. Will replete orally. recheck   Code Status: full Family Communication: none at bedside Disposition Plan: home when ready. Maybe late today, hopefully tomorrow   Consultants:  GI  Procedures:  none  Antibiotics:   none  HPI/Subjective: Reports continued pain/tenderniss LUQ and gas. No stool.  Objective: Filed Vitals:   01/26/13 1449 01/26/13 2201 01/27/13 0445 01/27/13 0745  BP: 128/90 137/92 149/89   Pulse: 98 73 70 90  Temp: 98.4 F (36.9 C) 98.3 F (36.8 C) 97.3 F (36.3 C)   TempSrc: Oral Oral Oral   Resp: 18 20 18    Height:      Weight:   100.8 kg (222 lb 3.6 oz)   SpO2: 87% 94% 94% 90%    Intake/Output Summary (Last 24 hours) at 01/27/13 0936 Last data filed at 01/27/13 0915  Gross per 24 hour  Intake   3070 ml  Output      0 ml   Net   3070 ml   Filed Weights   01/25/13 1810 01/27/13 0445  Weight: 92.2 kg (203 lb 4.2 oz) 100.8 kg (222 lb 3.6 oz)    Exam:   General:  Obese, face somewhat flushed. calm NAD  Cardiovascular: RRR, No MGR, No LE edema  Respiratory: normal effort BS clear bilaterally no rhonchi no wheeze  Abdomen: soft +BS mild tenderness LUQ. No guarding or rebound.   Musculoskeletal: no joint swelling/erythema no clubbing no cyanosis   Data Reviewed: Basic Metabolic Panel:  Recent Labs Lab 01/21/13 1310 01/25/13 1652 01/26/13 0548 01/27/13 0549  NA 132* 132* 135 137  K 3.7 3.1* 3.4* 3.1*  CL 93* 94* 100 103  CO2 27 27 26 27   GLUCOSE 157* 126* 110* 105*  BUN 21 26* 19 16  CREATININE 1.50* 1.39* 1.11* 1.08  CALCIUM 8.6 8.5 8.0* 8.2*   Liver Function Tests:  Recent Labs Lab 01/21/13 1310 01/25/13 1652 01/26/13 0548 01/27/13 0549  AST 52* 57* 35 33  ALT 78* 145* 110* 94*  ALKPHOS 49 47 38* 40  BILITOT 0.5 0.5 0.4 0.3  PROT 6.6 6.1 5.1* 4.8*  ALBUMIN 3.8 3.5 2.9* 2.7*    Recent Labs Lab 01/25/13 1652 01/27/13 0549  LIPASE 68* 108*   No results found for this basename: AMMONIA,  in the last 168 hours CBC:  Recent Labs Lab 01/21/13 1310 01/23/13 0947 01/25/13 1652 01/26/13 0548 01/27/13 0549  WBC 8.7 10.3 8.5 7.1 9.3  NEUTROABS 7.8*  --   --   --   --  HGB 14.4 14.0 13.1 12.0 11.3*  HCT 44.8 43.0 40.2 37.1 35.1*  MCV 73.2* 72.8* 71.9* 72.6* 73.6*  PLT 201 236 256 198 220   Cardiac Enzymes: No results found for this basename: CKTOTAL, CKMB, CKMBINDEX, TROPONINI,  in the last 168 hours BNP (last 3 results) No results found for this basename: PROBNP,  in the last 8760 hours CBG: No results found for this basename: GLUCAP,  in the last 168 hours  No results found for this or any previous visit (from the past 240 hour(s)).   Studies: No results found.  Scheduled Meds: . escitalopram  20 mg Oral Daily  . feeding supplement  1 Container Oral BID BM   . pantoprazole (PROTONIX) IV  40 mg Intravenous Q24H  . predniSONE  50 mg Oral Daily  . sodium chloride  3 mL Intravenous Q12H  . triamterene-hydrochlorothiazide  1 tablet Oral Daily  . Warfarin - Pharmacist Dosing Inpatient   Does not apply Q24H   Continuous Infusions: . sodium chloride 75 mL/hr at 01/27/13 [id number]    Principal Problem:   Diarrhea Active Problems:   ITP (idiopathic thrombocytopenic purpura)   Drug-induced hyperglycemia   Post-splenectomy   PE (pulmonary embolism)   Abdominal pain, epigastric   Chronic anticoagulation   Orthostatic hypotension    Time spent: 30 minutes    [facility] Pager [phone]. If 7PM-7AM, please contact night-coverage at [email or web address], password [facility] 01/27/2013, 9:36 AM  LOS: 2 days

## 2013-01-27 NOTE — Discharge Summary (Signed)
Physician Discharge Summary  Summer Cook [id number] DOB: 1965-10-23 DOA: 01/25/2013  PCP: Glo Herring., MD  Admit date: 01/25/2013 Discharge date: 01/27/2013  Time spent: 40 minutes  Recommendations for Outpatient Follow-up:  1. Pt has appointment with Dr Lamonte Sakai 02/02/13 2. Follow up with Dr Gala Romney in 3-4 weeks.  Recommend LFT's   Discharge Diagnoses:  Principal Problem:   Diarrhea Active Problems:   ITP (idiopathic thrombocytopenic purpura)   Hypokalemia   Drug-induced hyperglycemia   Post-splenectomy   PE (pulmonary embolism)   Abdominal pain, epigastric   Chronic anticoagulation   Orthostatic hypotension   Discharge Condition: stable   Diet recommendation: low fat diet  Filed Weights   01/25/13 1810 01/27/13 0445  Weight: 92.2 kg (203 lb 4.2 oz) 100.8 kg (222 lb 3.6 oz)    History of present illness:  47 year old woman presented to the gastroenterology office on 01/25/13 with complaint of two-week history of diarrhea and abdominal pain. Although nontoxic in appearance was noted to be profoundly orthostatic. 12 pound weight loss documented and referred directly for admission for further evaluation.  History obtained from patient at bedside. Case also discussed by telephone with Neil Crouch. For the previous 2 weeks  had daily diarrhea, multiple episodes too numerous to count. Prior she experienced early satiety and vomiting.  She reported no vomiting for the last several days but continued to have nausea and very poor oral intake as tends to cause nausea. She had abdominal pain which was located primarily in the epigastrium sometimes in the lower abdomen  constant in presence however severity waxed and waned. There were no aggravating or alleviating factors and her outpatient oral narcotics have not helped. She had not had any recent travel nor drink untreated water. She shared her meals with family members and none of them were sick. She had been dizzy when standing up and  although she had not passed out she became weaker and weaker.   Hospital Course:  1. Orthostatic hypotension, dehydration: Secondary to diarrhea and poor oral intake. Pt admitted and given IV fluids. By day#2 orthostatic hypotension resolved. No diarrhea/vomiting during this hospitalization.   2. Two-week history of vomiting, diarrhea, abdominal pain: Etiology unclear. CT abdomen and pelvis 4/26 was unremarkable. Exam benign. No recent leukocytosis. No episodes during this hospitalization so no stool studies. May be related to CellCept. Spoke with Dr. Agustina Caroli office recommended discontinuing and having pt follow up with him at discharge. Pt has appointment 02/02/13. Of note,pt reports stopping med 5 days ago. Seen by GI. Diet advanced and tolerated. On day of discharge pt tolerating diet, no nausea. 3. Failure to thrive, acute weight loss: Secondary to above. Dietitian recommended [facility]  4. History of ITP: Continue prednisone. Stop  CellCept. Spoke with Dr Agustina Caroli office recommended discontinuing for now. Will follow up with Dr Lamonte Sakai 02/02/13 5. History of pulmonary embolism 2013: Continued warfarin 6. Steroid-induced hyperglycemia: Currently not on any therapy per her endocrinologist Dr. Dorris Fetch. CBG 110 7. Chronic kidney disease stage III: Creatinine trending down.  8. Elevated Lipase and transaminases: trending down at discharge. Recommend follow up LFT's 02/02/13. 9.       Procedures:  none  Consultations:  GI  Discharge Exam: Filed Vitals:   01/26/13 2201 01/27/13 0445 01/27/13 0745 01/27/13 1332  BP: 137/92 149/89  138/93  Pulse: 73 70 90 104  Temp: 98.3 F (36.8 C) 97.3 F (36.3 C)  98.4 F (36.9 C)  TempSrc: Oral Oral  Oral  Resp: 20 18  20  Height:      Weight:  100.8 kg (222 lb 3.6 oz)    SpO2: 94% 94% 90% 93%    General: awake alert NAD Cardiovascular: RRR No MGR No LE edema Respiratory: normal effort BS clear bilaterally no wheeze no rhonchi Abdomen: soft +BS  non-tender to palpation Musculoskeletal: no clubbing no cyanosis Discharge Instructions      Discharge Orders   Future Appointments Provider Department Dept Phone   02/02/2013 9:00 AM [facility] [phone]   02/02/2013 9:30 AM Nobie Putnam, [place] [phone]   02/13/2013 10:00 AM Jonnie Kind, MD [facility] [phone]   Future Orders Complete By Expires     Diet - low sodium heart healthy  As directed     Increase activity slowly  As directed         Medication List    STOP taking these medications       mycophenolate 500 MG tablet  Commonly known as:  CELLCEPT      TAKE these medications       albuterol 108 (90 BASE) MCG/ACT inhaler  Commonly known as:  PROVENTIL HFA;VENTOLIN HFA  Inhale 2 puffs into the lungs every 6 (six) hours as needed for wheezing.     ALPRAZolam 1 MG tablet  Commonly known as:  XANAX  Take 1 mg by mouth 3 (three) times daily as needed for anxiety.     diphenoxylate-atropine 2.5-0.025 MG per tablet  Commonly known as:  LOMOTIL  Take 1 tablet by mouth 4 (four) times daily as needed for diarrhea or loose stools.     escitalopram 20 MG tablet  Commonly known as:  LEXAPRO  Take 20 mg by mouth daily.     feeding supplement Liqd  Take 1 Container by mouth 2 (two) times daily between meals.     HYDROcodone-acetaminophen 5-325 MG per tablet  Commonly known as:  NORCO/VICODIN  Take 1 tablet by mouth every 6 (six) hours as needed for pain.     ondansetron 8 MG tablet  Commonly known as:  ZOFRAN  Take 1 tablet (8 mg total) by mouth every 8 (eight) hours as needed for nausea.     pantoprazole 40 MG tablet  Commonly known as:  PROTONIX  Take 1 tablet (40 mg total) by mouth 2 (two) times daily.     predniSONE 10 MG tablet  Commonly known as:  DELTASONE  Take 50 mg by mouth daily.     triamterene-hydrochlorothiazide 37.5-25 MG per capsule  Commonly  known as:  DYAZIDE  Take 1 capsule by mouth daily.     vitamin B-12 1000 MCG tablet  Commonly known as:  CYANOCOBALAMIN  Take 2,000 mcg by mouth 2 (two) times daily.     Vitamin D 2000 UNITS Caps  Take 1 capsule by mouth daily.     warfarin 1 MG tablet  Commonly known as:  COUMADIN  Take 3 mg by mouth daily.       Allergies  Allergen Reactions  . Yellow Jacket Venom Swelling   Follow-up Information   Follow up with Sherryl Manges, MD On 02/02/2013. (at 9am)    Contact information:   [place]. [place] [address] [phone]       Follow up with Barney Drain, MD. Schedule an appointment as soon as possible for a visit in 2 weeks.   Contact information:   [address]  [address] [place] [address] [phone]        The results of significant diagnostics from this hospitalization (including imaging, microbiology, ancillary and laboratory) are listed below for reference.    Significant Diagnostic Studies: Ct Abdomen Pelvis Wo Contrast  01/21/2013  *RADIOLOGY REPORT*  Clinical Data: 47 year old female with abdominal pelvic pain. History of splenectomy.  CT ABDOMEN AND PELVIS WITHOUT CONTRAST  Technique:  Multidetector CT imaging of the abdomen and pelvis was performed following the standard protocol without intravenous contrast.  Comparison: 10/08/2010 CT  Findings: The lung bases are clear.  The liver, pancreas, kidneys and adrenal glands are unremarkable. The patient is status post cholecystectomy and splenectomy.  Please note that parenchymal abnormalities may be missed as intravenous contrast was not administered.  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 No other bowel abnormalities are identified. The bladder is unremarkable.  No free fluid, enlarged lymph nodes, biliary dilation or abdominal aortic aneurysm identified.  There is no evidence of pneumoperitoneum or abscess. No  acute or suspicious bony abnormalities are identified. A superior endplate compression fracture of T12 is remote.  IMPRESSION: No evidence of acute abnormality.   Original Report Authenticated By: Margarette Canada, M.D.     Microbiology: Recent Results (from the past 240 hour(s))  CLOSTRIDIUM DIFFICILE BY PCR     Status: None   Collection Time    01/27/13  2:04 PM      Result Value Range Status   C difficile by pcr NEGATIVE  NEGATIVE Final     Labs: Basic Metabolic Panel:  Recent Labs Lab 01/21/13 1310 01/25/13 1652 01/26/13 0548 01/27/13 0549  NA 132* 132* 135 137  K 3.7 3.1* 3.4* 3.1*  CL 93* 94* 100 103  CO2 27 27 26 27   GLUCOSE 157* 126* 110* 105*  BUN 21 26* 19 16  CREATININE 1.50* 1.39* 1.11* 1.08  CALCIUM 8.6 8.5 8.0* 8.2*   Liver Function Tests:  Recent Labs Lab 01/21/13 1310 01/25/13 1652 01/26/13 0548 01/27/13 0549  AST 52* 57* 35 33  ALT 78* 145* 110* 94*  ALKPHOS 49 47 38* 40  BILITOT 0.5 0.5 0.4 0.3  PROT 6.6 6.1 5.1* 4.8*  ALBUMIN 3.8 3.5 2.9* 2.7*    Recent Labs Lab 01/25/13 1652 01/27/13 0549  LIPASE 68* 108*   No results found for this basename: AMMONIA,  in the last 168 hours CBC:  Recent Labs Lab 01/21/13 1310 01/23/13 0947 01/25/13 1652 01/26/13 0548 01/27/13 0549  WBC 8.7 10.3 8.5 7.1 9.3  NEUTROABS 7.8*  --   --   --   --   HGB 14.4 14.0 13.1 12.0 11.3*  HCT 44.8 43.0 40.2 37.1 35.1*  MCV 73.2* 72.8* 71.9* 72.6* 73.6*  PLT 201 236 256 198 220   Cardiac Enzymes: No results found for this basename: CKTOTAL, CKMB, CKMBINDEX, TROPONINI,  in the last 168 hours BNP: BNP (last 3 results) No results found for this basename: PROBNP,  in the last 8760 hours CBG: No results found for this basename: GLUCAP,  in the last 168 hours     Signed:  Radene Gunning  [facility] 01/27/2013, 4:47 PM

## 2013-01-27 NOTE — Progress Notes (Signed)
REVIEWED. AGREE. 

## 2013-01-27 NOTE — Progress Notes (Signed)
Subjective:  Eating 100% of meals. No BM since admission. Some worsening of epigastric pain since admission. More talkative today. Looks much better than day of admission.  Objective: Vital signs in last 24 hours: Temp:  [97.3 F (36.3 C)-98.4 F (36.9 C)] 97.3 F (36.3 C) (05/02 0445) Pulse Rate:  [70-103] 90 (05/02 0745) Resp:  [16-20] 18 (05/02 0445) BP: (120-149)/(81-92) 149/89 mmHg (05/02 0445) SpO2:  [87 %-94 %] 90 % (05/02 0745) Weight:  [222 lb 3.6 oz (100.8 kg)] 222 lb 3.6 oz (100.8 kg) (05/02 0445) Last BM Date: 01/25/13 General:   Alert,  Well-developed, well-nourished, pleasant and cooperative in NAD Head:  Normocephalic and atraumatic. Eyes:  Sclera clear, no icterus.   Abdomen:  Soft, mild epigastric tenderness and nondistended.   Normal bowel sounds, without guarding, and without rebound.   Extremities:  Without clubbing, deformity or edema. Neurologic:  Alert and  oriented x4;  grossly normal neurologically. Skin:  Intact without significant lesions or rashes. Psych:  Alert and cooperative. Normal mood and affect.  Intake/Output from previous day: 05/01 0701 - 05/02 0700 In: 3070 [P.O.:720; I.V.:2350] Out: -  Intake/Output this shift:    Lab Results: CBC  Recent Labs  01/25/13 1652 01/26/13 0548 01/27/13 0549  WBC 8.5 7.1 9.3  HGB 13.1 12.0 11.3*  HCT 40.2 37.1 35.1*  MCV 71.9* 72.6* 73.6*  PLT 256 198 220   BMET  Recent Labs  01/25/13 1652 01/26/13 0548 01/27/13 0549  NA 132* 135 137  K 3.1* 3.4* 3.1*  CL 94* 100 103  CO2 27 26 27   GLUCOSE 126* 110* 105*  BUN 26* 19 16  CREATININE 1.39* 1.11* 1.08  CALCIUM 8.5 8.0* 8.2*   LFTs  Recent Labs  01/25/13 1652 01/26/13 0548 01/27/13 0549  BILITOT 0.5 0.4 0.3  BILIDIR  --   --  <0.1  IBILI  --   --  NOT CALCULATED  ALKPHOS 47 38* 40  AST 57* 35 33  ALT 145* 110* 94*  PROT 6.1 5.1* 4.8*  ALBUMIN 3.5 2.9* 2.7*    Recent Labs  01/25/13 1652 01/27/13 0549  LIPASE 68* 108*    PT/INR  Recent Labs  01/25/13 1652 01/26/13 0548 01/27/13 0549  LABPROT 33.9* 34.6* 27.6*  INR 3.61* 3.71* 2.73*      Imaging Studies: Ct Abdomen Pelvis Wo Contrast  01/21/2013  *RADIOLOGY REPORT*  Clinical Data: 47 year old female with abdominal pelvic pain. History of splenectomy.  CT ABDOMEN AND PELVIS WITHOUT CONTRAST  Technique:  Multidetector CT imaging of the abdomen and pelvis was performed following the standard protocol without intravenous contrast.  Comparison: 10/08/2010 CT  Findings: The lung bases are clear.  The liver, pancreas, kidneys and adrenal glands are unremarkable. The patient is status post cholecystectomy and splenectomy.  Please note that parenchymal abnormalities may be missed as intravenous contrast was not administered.  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 No other bowel abnormalities are identified. The bladder is unremarkable.  No free fluid, enlarged lymph nodes, biliary dilation or abdominal aortic aneurysm identified.  There is no evidence of pneumoperitoneum or abscess. No acute or suspicious bony abnormalities are identified. A superior endplate compression fracture of T12 is remote.  IMPRESSION: No evidence of acute abnormality.   Original Report Authenticated By: Margarette Canada, M.D.   [2 weeks]   Assessment: 47 year old female with complicated past medical history of ITP on prednisone/CellCept, pulmonary embolus on Coumadin, who presented with acute onset  nausea, vomiting, diarrhea of approximately 2 weeks' duration. Also with new onset mild transaminitis, hyponatremia, hypokalemia. Minimally elevated lipase, nonspecific. No diarrhea since admission. Tolerating advanced diet. Her AST/ALT continue to improve.Patient noted to have mildly elevated lipase in setting of chronic renal insufficiency and felt to be nonspecific finding. Noncontrast CT on 01/21/13 without evidence of liver or  pancreatic abnormality and she is s/p cholecystectomy.  Patient interested in discharge.   Plan: 1. OK for discharge from GI standpoint. 2. We will be glad to have close interval f/u with her regarding her abnormal LFTs, lipase. 3. She should have repeat LFTS, lipase in one week.   LOS: 2 days   Neil Crouch  01/27/2013, 8:10 AM

## 2013-01-30 LAB — GIARDIA/CRYPTOSPORIDIUM SCREEN(EIA): Giardia Screen - EIA: NEGATIVE

## 2013-01-31 ENCOUNTER — Encounter: Payer: Self-pay | Admitting: Internal Medicine

## 2013-01-31 LAB — STOOL CULTURE

## 2013-01-31 IMAGING — US US EXTREM LOW VENOUS BILAT
1 series · 14 of 24 positions shown · non-contrast
Comparison: None.

CLINICAL DATA: Bilateral lower extremity pain

BILATERAL LOWER EXTREMITY VENOUS DUPLEX ULTRASOUND
TECHNIQUE: Gray-scale sonography with graded compression, as well
as color Doppler and duplex ultrasound, were performed to evaluate
the deep venous system of both lower extremities from the level of
the common femoral vein through the popliteal and proximal calf
veins.  Spectral Doppler was utilized to evaluate flow at rest and
with distal augmentation maneuvers.

[Series 1: us extrem low venous bilat · 14 of 52 slices shown]
[im 1/52]
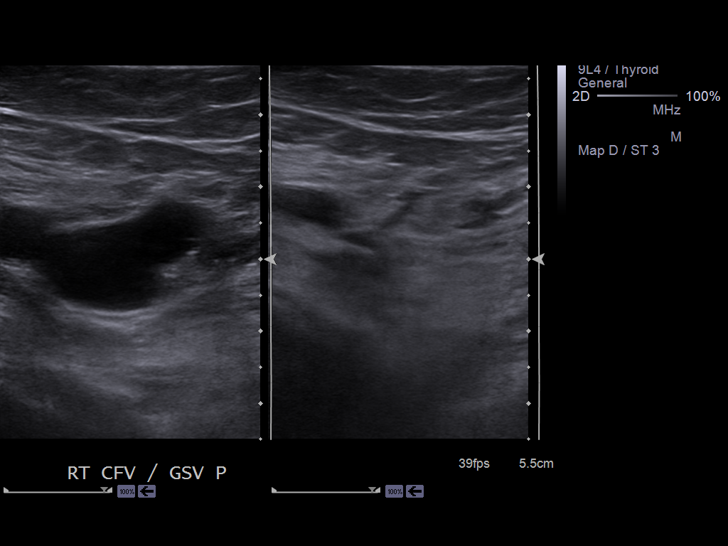
[im 5/52]
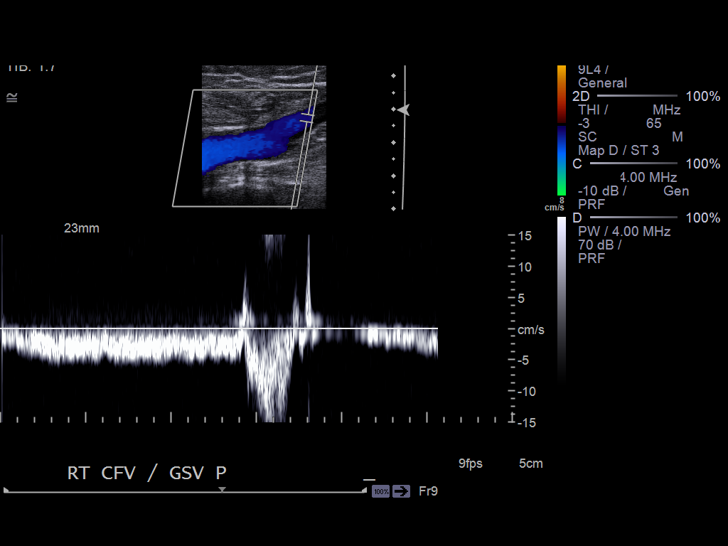
[im 9/52]
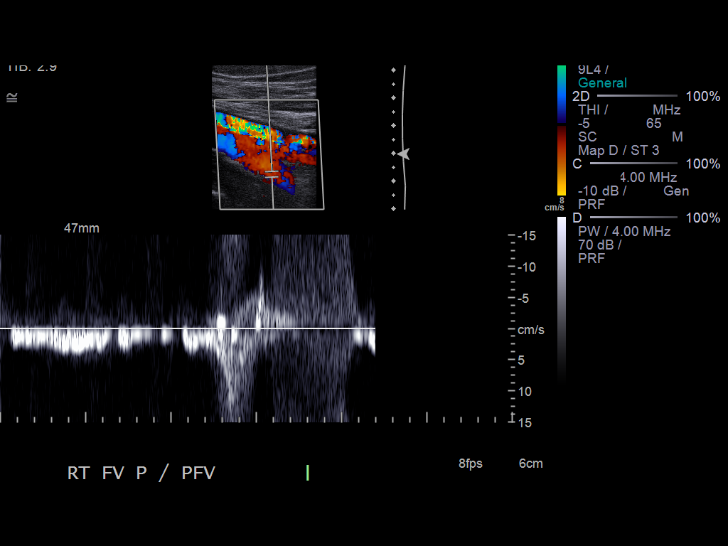
[im 14/52]
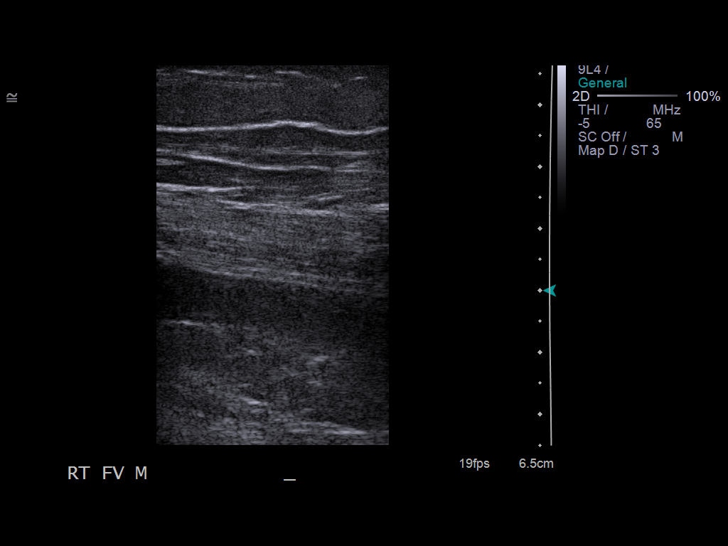
[im 16/52]
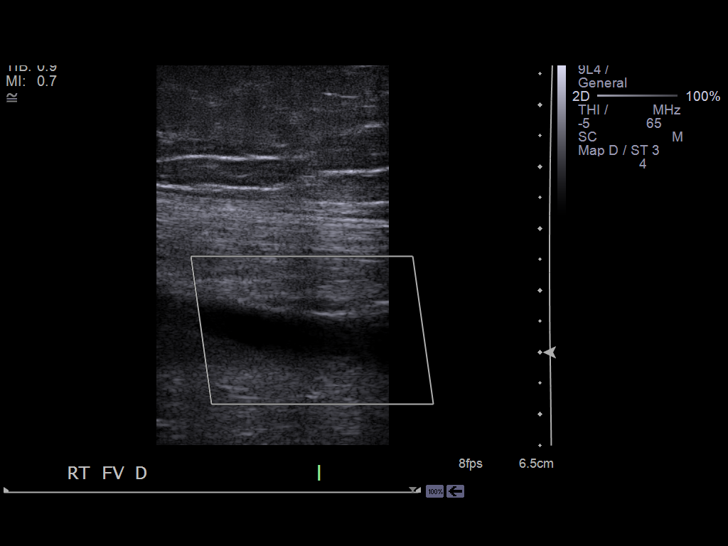
[im 20/52]
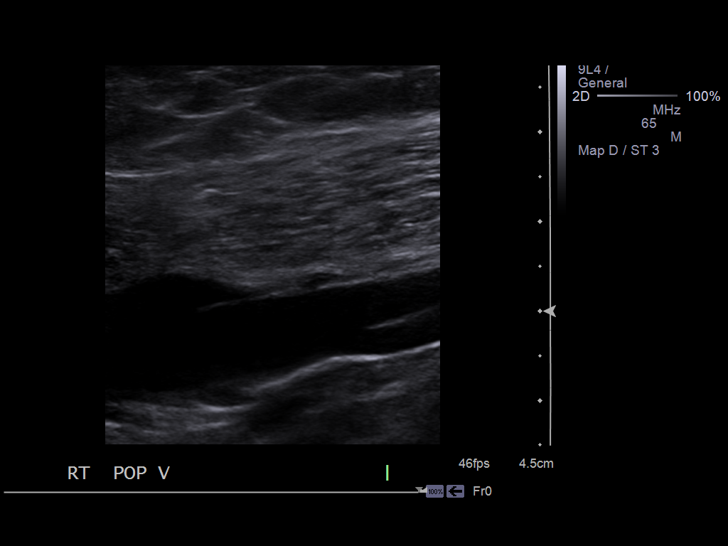
[im 25/52]
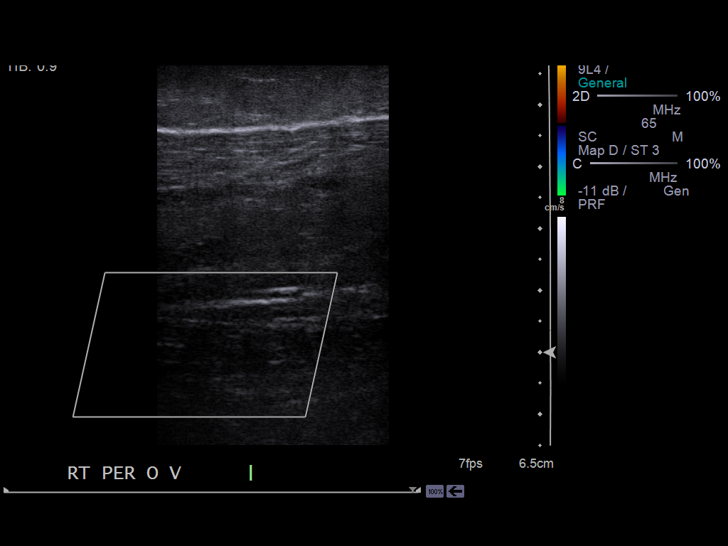
[im 27/52]
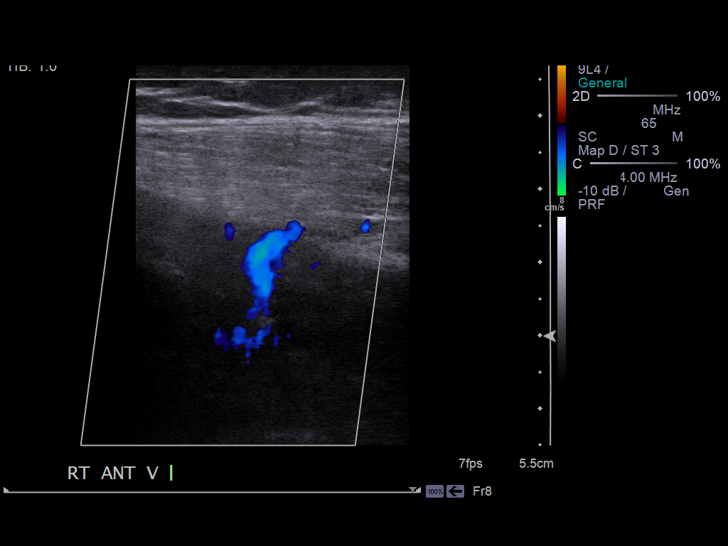
[im 32/52]
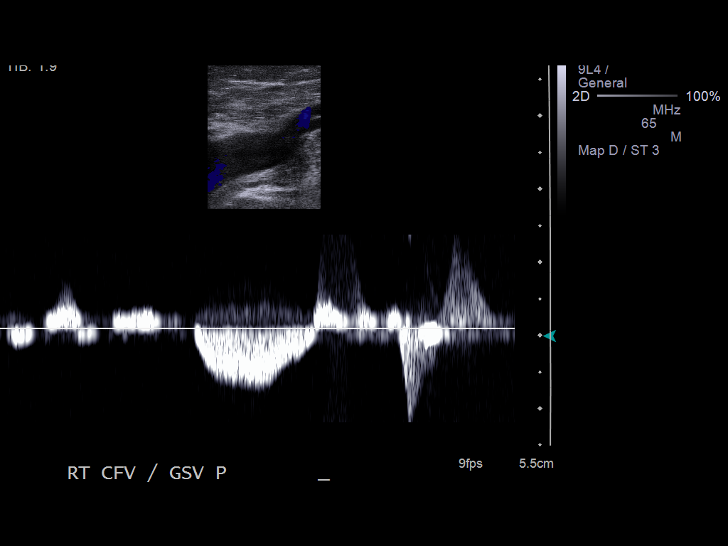
[im 36/52]
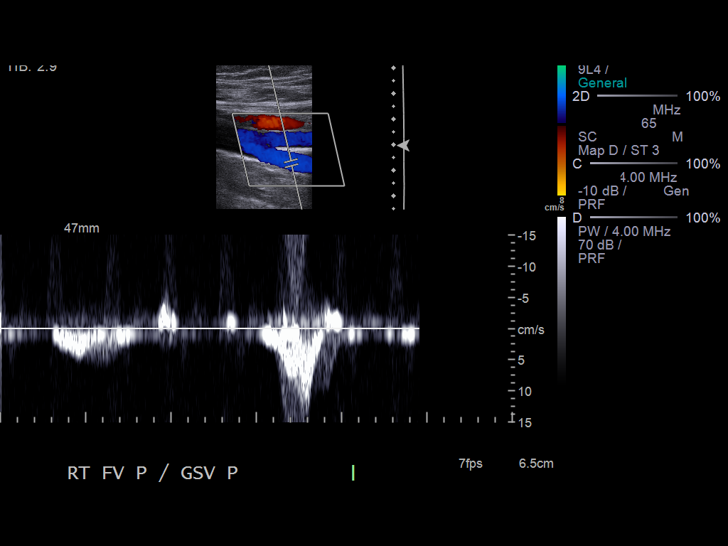
[im 40/52]
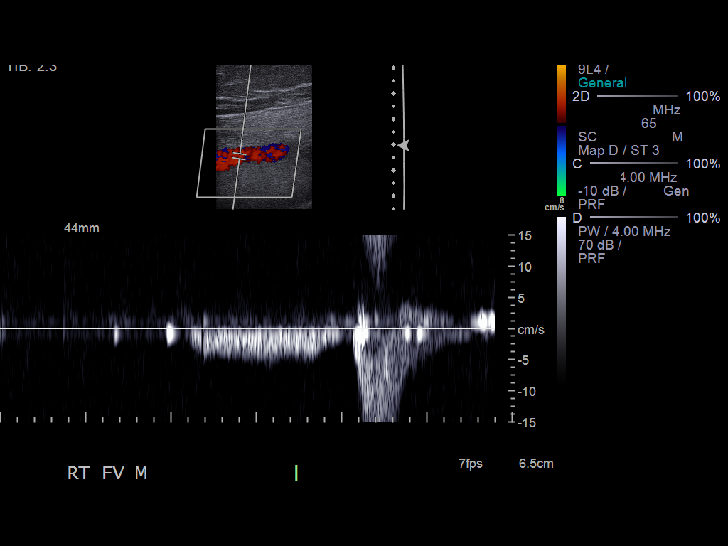
[im 43/52]
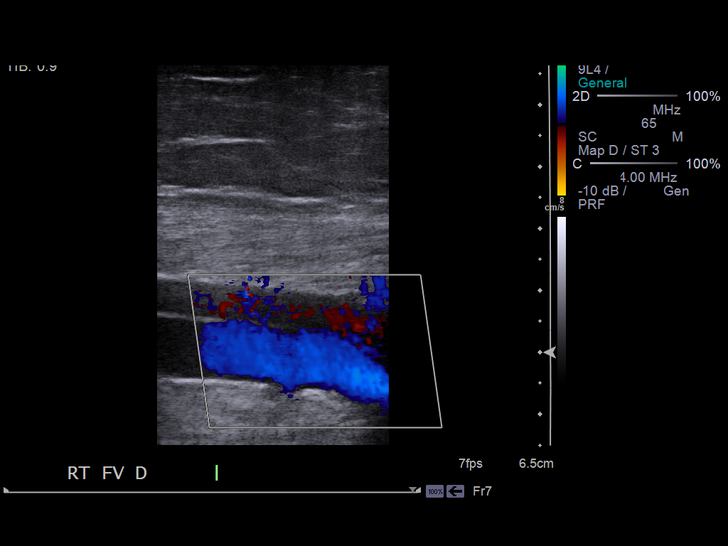
[im 47/52]
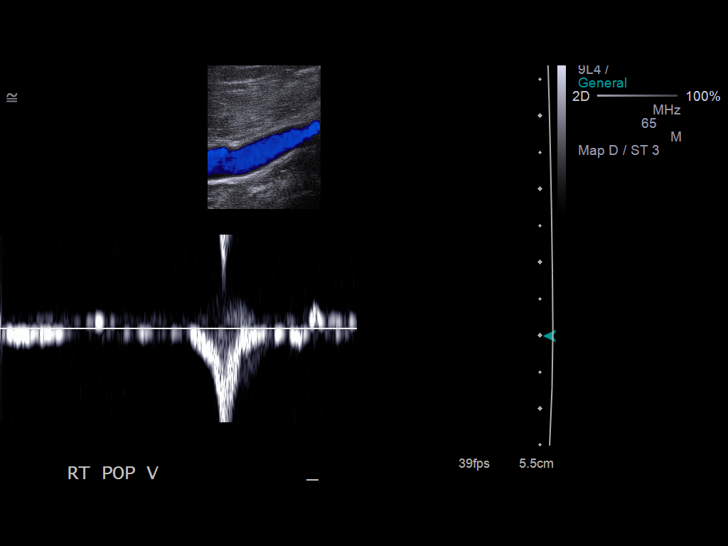
[im 52/52]
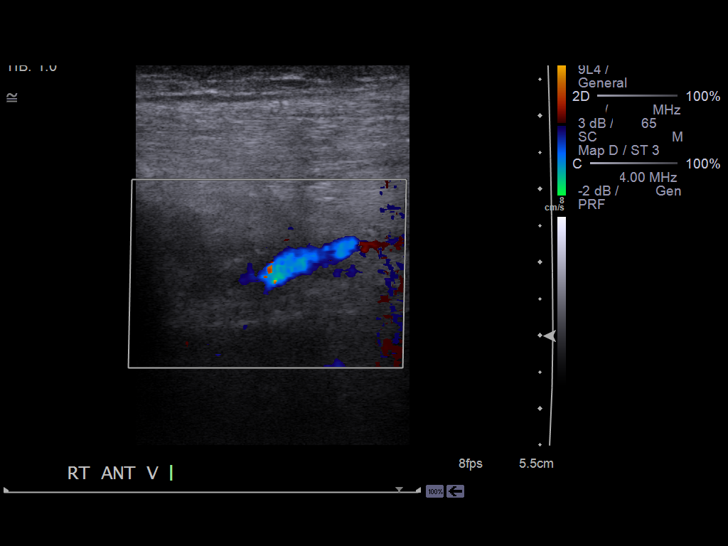

[14 of 24 positions shown; findings below may reference images not displayed]

FINDINGS: Normal compressibility of bilateral common femoral,
superficial femoral, and popliteal veins is demonstrated, as well
as the visualized proximal calf veins.  No filling defects to
suggest DVT on grayscale or color Doppler imaging.  Doppler
waveforms show normal direction of venous flow, normal respiratory
phasicity and response to augmentation.
IMPRESSION: No evidence of bilateral lower extremity deep vein thrombosis.

## 2013-01-31 NOTE — Telephone Encounter (Signed)
Pt is aware of OV on 5/19 at 2 with RMR and appt card was mailed

## 2013-02-02 ENCOUNTER — Ambulatory Visit (HOSPITAL_BASED_OUTPATIENT_CLINIC_OR_DEPARTMENT_OTHER): Payer: BC Managed Care – PPO | Admitting: Oncology

## 2013-02-02 ENCOUNTER — Other Ambulatory Visit (HOSPITAL_BASED_OUTPATIENT_CLINIC_OR_DEPARTMENT_OTHER): Payer: BC Managed Care – PPO | Admitting: Lab

## 2013-02-02 VITALS — BP 162/115 | HR 128 | Temp 98.3°F | Resp 20 | Ht 64.0 in | Wt 213.8 lb

## 2013-02-02 DIAGNOSIS — I2699 Other pulmonary embolism without acute cor pulmonale: Secondary | ICD-10-CM

## 2013-02-02 DIAGNOSIS — D693 Immune thrombocytopenic purpura: Secondary | ICD-10-CM

## 2013-02-02 DIAGNOSIS — R000 Tachycardia, unspecified: Secondary | ICD-10-CM

## 2013-02-02 LAB — CBC WITH DIFFERENTIAL/PLATELET
Basophils Absolute: 0 10*3/uL (ref 0.0–0.1)
EOS%: 0 % (ref 0.0–7.0)
HCT: 34.8 % (ref 34.8–46.6)
HGB: 11.1 g/dL — ABNORMAL LOW (ref 11.6–15.9)
LYMPH%: 11.2 % — ABNORMAL LOW (ref 14.0–49.7)
MCH: 23.7 pg — ABNORMAL LOW (ref 25.1–34.0)
NEUT%: 84.2 % — ABNORMAL HIGH (ref 38.4–76.8)
Platelets: 336 10*3/uL (ref 145–400)
lymph#: 0.8 10*3/uL — ABNORMAL LOW (ref 0.9–3.3)

## 2013-02-02 LAB — COMPREHENSIVE METABOLIC PANEL (CC13)
ALT: 67 U/L — ABNORMAL HIGH (ref 0–55)
CO2: 30 mEq/L — ABNORMAL HIGH (ref 22–29)
Calcium: 8.7 mg/dL (ref 8.4–10.4)
Chloride: 97 mEq/L — ABNORMAL LOW (ref 98–107)
Sodium: 140 mEq/L (ref 136–145)
Total Protein: 5.9 g/dL — ABNORMAL LOW (ref 6.4–8.3)

## 2013-02-02 LAB — PROTIME-INR

## 2013-02-02 NOTE — Progress Notes (Signed)
[place]  Telephone:([phone] Fax:([phone]   OFFICE PROGRESS NOTE   Cc:  Glo Herring., MD  DIAGNOSIS:  Recurrent ITP  PAST THERAPY: Prednisone, IVIg (transient response), Rituxan (no response), Cytoxan (no response).  Last was Prednisone 40mg ; 2 wk in 10/2012.  She also was on Prednisone bridged with Cellcept briefly but stopped due to lack of insurance.  She had splenectomy in 2012.   CURRENT THERAPY: Prednisone 50mg  PO daily.   INTERVAL HISTORY: Summer Cook 47 y.o. female returns for regular follow up by herself.  She visited her aunt in April who was having nausea/vomiting/diarrhea.  Two days later, patient herself developed similar symptoms that lasted for 3 weeks.  She has been on Prednisone 50mg  PO daily since 01/09/13.  Her last dose of CellCept was on 01/25/2013 due to potential role of CellCept in causing GI symptoms.  She has not had these symptoms for the past 1 days.  However, she is still tired.   She thinks that her stamina is about 75% from baseline.  She still has diffuse fluid retention.  She denied visible bleeding.  She has had palpation.  She denied chest pain, SOB, wheezing.   The rest of the 14-point review of system was negative.    Past Medical History  Diagnosis Date  . Essential hypertension, benign   . 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  Last was Prednisone 40mg ; 2 wk in 10/2012.  She also was on Prednisone bridged with Cellcept briefly but stopped due to lack of insurance.   . Depression   . Thrush, oral 05/29/2011  . Steroid-induced diabetes   . Pulmonary embolism 04/2012  . Asthma   . Renal insufficiency     Past Surgical History  Procedure Laterality Date  . Cholecystectomy  2008  . Bone marrow biopsy    . 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 Colonoscopy with esophagogastroduodenoscopy (egd) N/A 01/04/2013     Dr. Gala Romney: esophageal plaques with +KOH, hh. Gastric antrum abnormal, bx reactive gastropathy. Anal canal hemorrhoids, colonic tics, normal TI, single polyp (sessile serrated tubular adenoma). Next TCS 12/2017    Current Outpatient Prescriptions  Medication Sig Dispense Refill  . albuterol (PROVENTIL HFA;VENTOLIN HFA) 108 (90 BASE) MCG/ACT inhaler Inhale 2 puffs into the lungs every 6 (six) hours as needed for wheezing.      Marland Kitchen ALPRAZolam (XANAX) 1 MG tablet Take 1 mg by mouth 3 (three) times daily as needed for anxiety.       . Cholecalciferol (VITAMIN D) 2000 UNITS CAPS Take 1 capsule by mouth daily.      Marland Kitchen escitalopram (LEXAPRO) 20 MG tablet Take 20 mg by mouth daily.      . fish oil-omega-3 fatty acids 1000 MG capsule Take 2 g by mouth daily.      Marland Kitchen HYDROcodone-acetaminophen (NORCO/VICODIN) 5-325 MG per tablet Take 1 tablet by mouth every 6 (six) hours as needed for pain.  15 tablet  0  . ondansetron (ZOFRAN) 8 MG tablet Take 1 tablet (8 mg total) by mouth every 8 (eight) hours as needed for nausea.  20 tablet  0  . pantoprazole (PROTONIX) 40 MG tablet Take 1 tablet (40 mg total) by mouth 2 (two) times daily.  60 tablet  0  . predniSONE (DELTASONE) 10 MG tablet Take 50 mg by mouth daily.       Marland Kitchen triamterene-hydrochlorothiazide (DYAZIDE) 37.5-25 MG  per capsule Take 1 capsule by mouth daily.      . vitamin B-12 (CYANOCOBALAMIN) 1000 MCG tablet Take 2,000 mcg by mouth 2 (two) times daily.       Marland Kitchen warfarin (COUMADIN) 1 MG tablet Take 3 mg by mouth daily.      . [DISCONTINUED] prochlorperazine (COMPAZINE) 10 MG tablet Take 10 mg by mouth as needed. For nausea        No current facility-administered medications for this visit.    ALLERGIES:  is allergic to yellow jacket venom.  REVIEW OF SYSTEMS:  The rest of the 14-point review of system was negative.   Filed Vitals:   02/02/13 0915  BP: 162/115  Pulse: 128  Temp: 98.3 F (36.8 C)  Resp: 20   Wt Readings from Last 3 Encounters:   02/02/13 213 lb 12.8 oz (96.979 kg)  01/27/13 222 lb 3.6 oz (100.8 kg)  01/25/13 203 lb 9.6 oz (92.352 kg)   ECOG Performance status: 1  PHYSICAL EXAMINATION:   General:  Mildly obese woman in no acute distress.  Eyes:  no scleral icterus.  ENT:  There were no oropharyngeal lesions.  Neck was without thyromegaly.  Lymphatics:  Negative cervical, supraclavicular or axillary adenopathy.  Respiratory: lungs were clear bilaterally without wheezing or crackles.  Cardiovascular:  Tachy but regular rate and rhythm, S1/S2, without murmur, rub or gallop.  There was no pedal edema.  GI:  abdomen was soft, flat, nontender, nondistended, without organomegaly.  Muscoloskeletal:  no spinal tenderness of palpation of vertebral spine.  Skin exam was without echymosis, petichae.  Neuro exam was nonfocal.  Patient was able to get on and off exam table without assistance.  Gait was normal.  Patient was alerted and oriented.  Attention was good.   Language was appropriate.  Mood was normal without depression.  Speech was not pressured.  Thought content was not tangential.      LABORATORY/RADIOLOGY DATA:  Lab Results  Component Value Date   WBC 7.0 02/02/2013   HGB 11.1* 02/02/2013   HCT 34.8 02/02/2013   PLT 336 02/02/2013   GLUCOSE 241* 02/02/2013   CHOL 169 02/03/2011   TRIG 98 02/03/2011   HDL 45 02/03/2011   LDLCALC  Value: 104        Total Cholesterol/HDL:CHD Risk Coronary Heart Disease Risk Table                     Men   Women  1/2 Average Risk   3.4   3.3  Average Risk       5.0   4.4  2 X Average Risk   9.6   7.1  3 X Average Risk  23.4   11.0        Use the calculated Patient Ratio above and the CHD Risk Table to determine the patient's CHD Risk.        ATP III CLASSIFICATION (LDL):  <100     mg/dL   Optimal  100-129  mg/dL   Near or Above                    Optimal  130-159  mg/dL   Borderline  160-189  mg/dL   High  >190     mg/dL   Very High* 02/03/2011   ALKPHOS 60 02/02/2013   ALT 67* 02/02/2013   AST 23 02/02/2013    NA 140 02/02/2013   K 3.9 02/02/2013   CL 97* 02/02/2013  CREATININE 1.4* 02/02/2013   BUN 25.7 02/02/2013   CO2 30* 02/02/2013   INR 1.80* 02/02/2013   HGBA1C 7.5* 05/03/2012         ASSESSMENT AND PLAN:    1.  Issue:  Recurrent ITP. -  Current treatment: Prednisone taper in addition to CellCept. CellCept has been on hold due to nausea/vomiting, diarrhea.  -  Treatment options:  * Very slow prednisone taper and very slow CellCept up titration.   *  Another option is to start Nplate (weeky subcutaneous injection) or Promecta (daily pill).  These are mediations that increase platelet production in bone marrow.  They have low chance of side effects.  The chance of response is about 60%.  However, they are expensive and require very long term treatment.   She preferred not to try CellCept again due to GI side effects.  She would like to start Nplate.   She would like to transfer her care to [facility] due to close proximity and since I am leaving the practice.  I made the referral today.  I went ahead and attempt to pre-approve   Nplate for her so that she may start if Dr. [name] agrees with this option.   In the meantime, I recommended tapering down Prednisone to 45mg  PO qam.  She still has weekly CBC check.   2.  Palpitation:  Tachycardic here in clinic at multiple visits.  I referred her to Cardiology.   3.  Pulmonary embolism:  She follows up with her PCP to manage her INR.   Dr. [name] her new hematologist may consider addressing whether she can go off of anticoagulation.   4.   Follow up:  With Dr. [name] within 3 weeks. .     The length of time of the face-to-face encounter was 25 minutes. More than 50% of time was spent counseling and coordination of care.

## 2013-02-02 NOTE — Patient Instructions (Addendum)
1. Diagnosis: Recurrent ITP. 2. Current treatment: Prednisone taper in addition to CellCept. 3. Issue: Nausea vomiting, diarrhea are possibly due to CellCept. 4. Recommendation:  * Very slow prednisone taper and very slow CellCept up titration.   *  Another option is splenectomy (surgery to remove the spleen).  Chance of 5 year remission from ITP with this is about 60%.   *  Another option is to start Nplate (weeky subcutaneous injection) or Promecta (daily pill).  These are mediations that increase platelet production in bone marrow.  They have low chance of side effects.  The chance of response is about 60%.  However, they are expensive and require very long term treatment.

## 2013-02-03 ENCOUNTER — Telehealth: Payer: Self-pay

## 2013-02-03 NOTE — Telephone Encounter (Signed)
Tried to call pt- LMOM 

## 2013-02-03 NOTE — Telephone Encounter (Signed)
Tried to call pt again, LM on voicemail with details.

## 2013-02-03 NOTE — Telephone Encounter (Signed)
Pt called- released from [facility] last Friday. Has had solid stools this week until yesterday, increase in frequency and got looser with each BM. Had diarrhea today with BRB. Has felt good the past few days, today feels ok but a little disoriented. No pain, no N/V. No fever, but feels sweaty and clammy.  Saw Dr. Lamonte Sakai yesterday- decreased prednisone, he is moving so he is going to transfer care to Dr. [name] and they are going to see about trying new medications but she hasnt started this yet.  Pt is concerned and she doesn't want to end up in the hospital again.   Please advise.

## 2013-02-03 NOTE — Telephone Encounter (Signed)
If she is having significant amount of BRBPR, she will have to go to ER. She has h/o hemorrhoids and diverticula. Too far out from polypectomy to be related.  Sweaty/clammy may be sign of low BP or high heart rate. Both reason for being evaluated in ER.  If she continues to feels this way, she will need to go to ER or urgently to PCP.  Her labs from 02/02/13 are relatively stable. Her Hgb is generally in 12 range when well-hydrated. Her INR is fine. Her LFTs are improving.  For diarrhea, I would watch for couple of days. Add imodium 2mg  TID, may take Lomotil as well if needed. Recent stool studies negative.   Keep OV with RMR only.  PR first of week.    Lab Results  Component Value Date   WBC 7.0 02/02/2013   HGB 11.1* 02/02/2013   HCT 34.8 02/02/2013   MCV 74.3* 02/02/2013   PLT 336 02/02/2013   Lab Results  Component Value Date   INR 1.80* 02/02/2013   INR 2.73* 01/27/2013   INR 3.71* 01/26/2013   PROTIME 21.6* 02/02/2013   PROTIME 24.0* 12/07/2012   PROTIME 12.0 11/21/2012   Lab Results  Component Value Date   CREATININE 1.4* 02/02/2013   BUN 25.7 02/02/2013   NA 140 02/02/2013   K 3.9 02/02/2013   CL 97* 02/02/2013   CO2 30* 02/02/2013   Lab Results  Component Value Date   ALT 67* 02/02/2013   AST 23 02/02/2013   ALKPHOS 60 02/02/2013   BILITOT 0.68 02/02/2013   Albumin 3.1 up from 2.7

## 2013-02-06 ENCOUNTER — Telehealth: Payer: Self-pay | Admitting: Oncology

## 2013-02-06 ENCOUNTER — Telehealth: Payer: Self-pay | Admitting: *Deleted

## 2013-02-06 ENCOUNTER — Other Ambulatory Visit: Payer: Self-pay | Admitting: Oncology

## 2013-02-06 DIAGNOSIS — B370 Candidal stomatitis: Secondary | ICD-10-CM

## 2013-02-06 MED ORDER — FLUCONAZOLE 100 MG PO TABS: 100.0000 mg | ORAL_TABLET | Freq: Every day | ORAL | Status: DC

## 2013-02-06 NOTE — Telephone Encounter (Signed)
Pt reports white patches in her mouth and on her tongue.  Tongue feels sore.  States has had Thrush in the past and was on Fluconazole.  She denies any fevers.  Asks if she should be on the "3 antibiotics Dr. Truddie Coco had me on last year?"   Pt states he told her to stay on antibiotics indefinitely but she has not been on them in over 6 months now due to financial/insurance reasons.   Asks if Dr. Lamonte Sakai will prescribe fluconazole and antibiotics?    Her appt at [facility] is not until June 11 and that is the soonest they said they could see her.

## 2013-02-06 NOTE — Telephone Encounter (Signed)
The most common explanation for her sx and being on Prednisone is oral thrush.  Please inform her that I would only prescribe Diflucan without seeing her.  I'm not sure why Dr. Truddie Coco gave her triple antibiotic in the past for presumed yeast infection.  If Diflucan does not improve her symptoms, then she needs to see her primary care doctor for further evaluation.

## 2013-02-06 NOTE — Telephone Encounter (Signed)
Informed pt of Diflucan ordered at [facility] for thrush.  Discussed thrush common effect of prednisone.  She verbalized understanding and is well aware of thrush happening while on prednisone.  Instructed her to contact PCP if any fevers or any other symptoms.  She verbalized understanding.

## 2013-02-09 IMAGING — CT CT L SPINE W/O CM
3 of 9 series · 10 of 33 positions shown, 12 images · non-contrast
Comparison: 04/30/2011 plain film lumbar spine.  10/08/2010 CT
abdomen and pelvis.

CLINICAL DATA: Bilateral leg pain and low back pain for past year.

CT LUMBAR SPINE WITHOUT CONTRAST
TECHNIQUE: Multidetector CT imaging of the lumbar spine was
performed without intravenous contrast administration. Multiplanar
CT image reconstructions were also generated.

[Series 3: lumbar spine 2.0 b30s · axial · 0.36mm/px · z∈[-304,-208]mm · 2 of 112 slices shown, 3 images]
[im 32/112  soft-tissue]
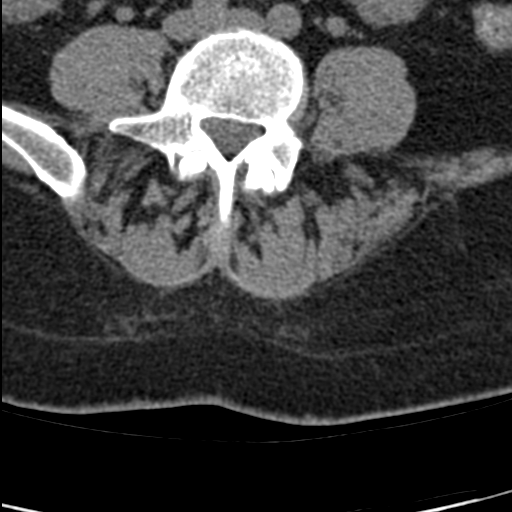
[im 32/112  bone]
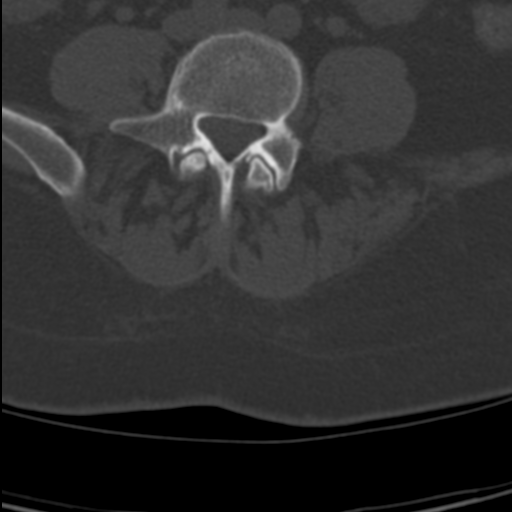
[im 80/112  bone]
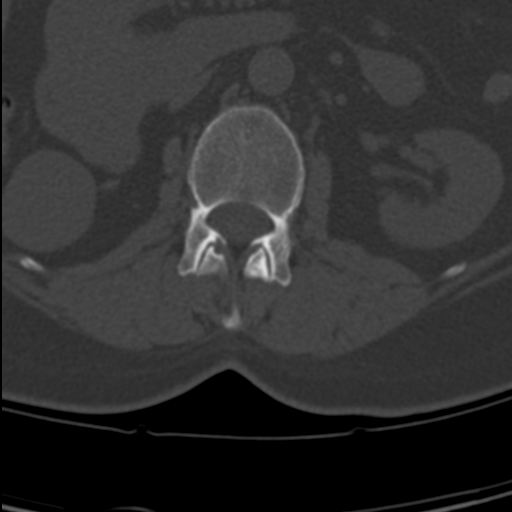

[Series 4: lumbar spine 2.0 spo · coronal · 0.23mm/px · 3 of 66 slices shown (1 of 2)]
[im 14/66  bone]
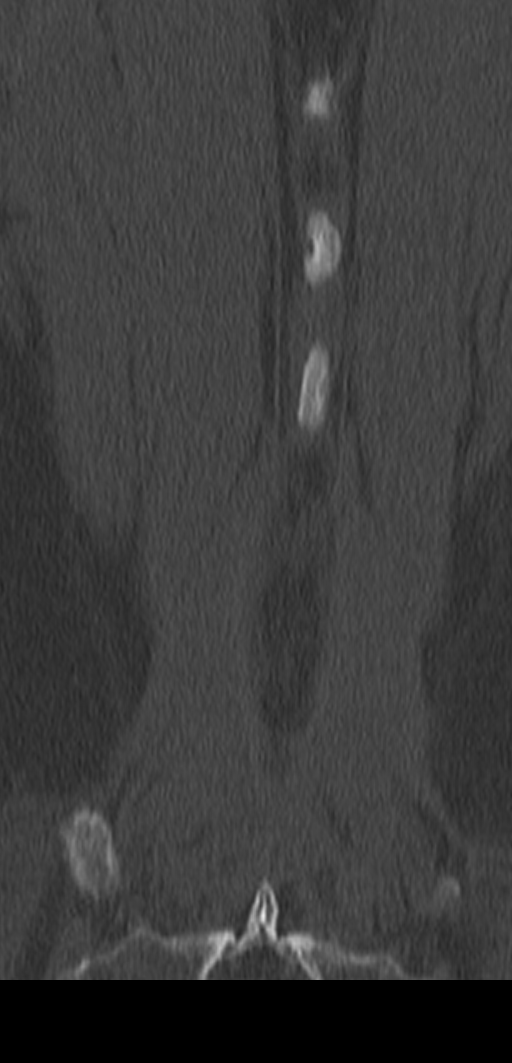
[im 27/66  bone]
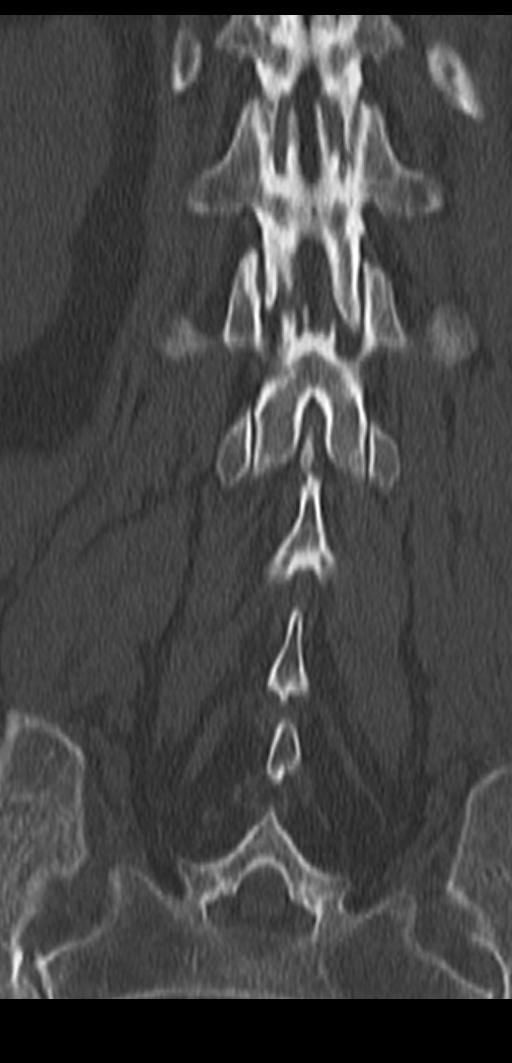
[im 40/66  bone]
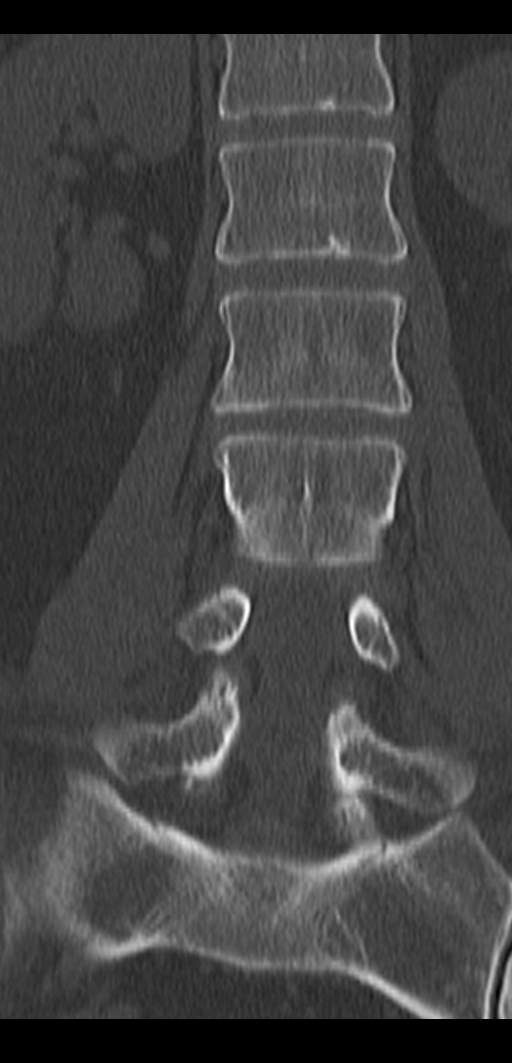

[Series 5: lumbar spine 2.0 spo · sagittal · 0.29mm/px · 5 of 68 slices shown, 6 images (2 of 2)]
[im 23/68  bone]
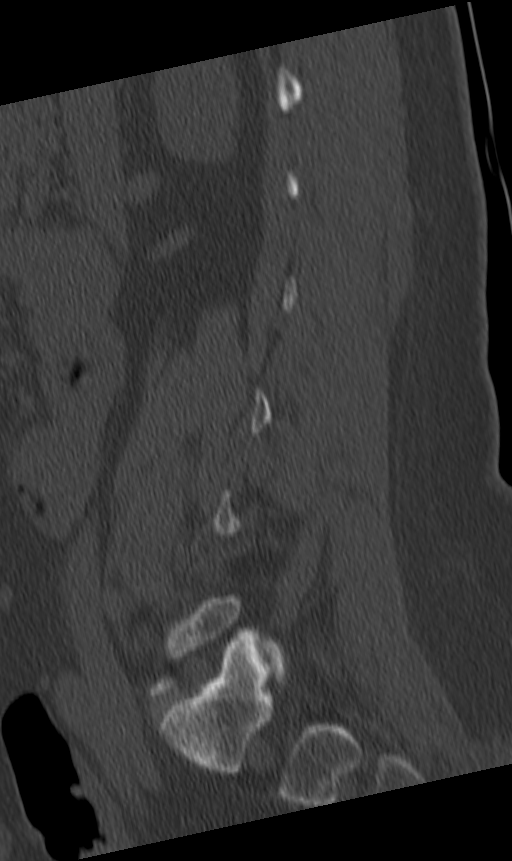
[im 28/68  bone]
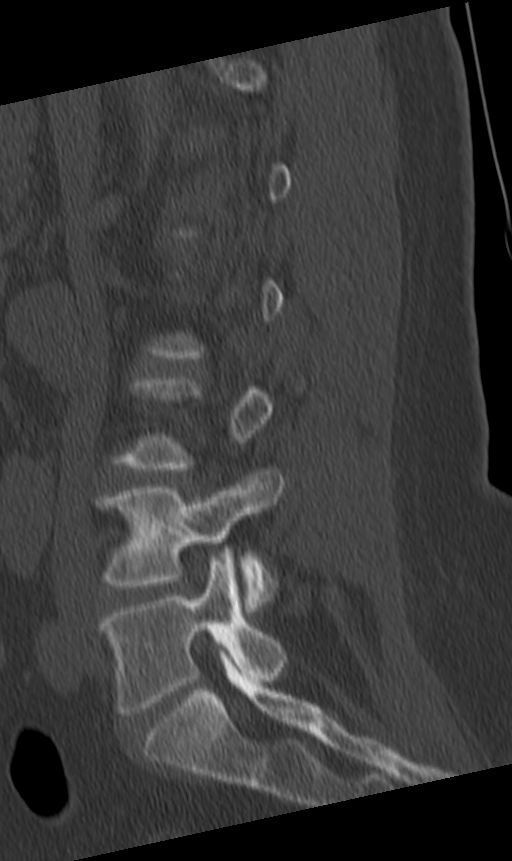
[im 34/68  soft-tissue]
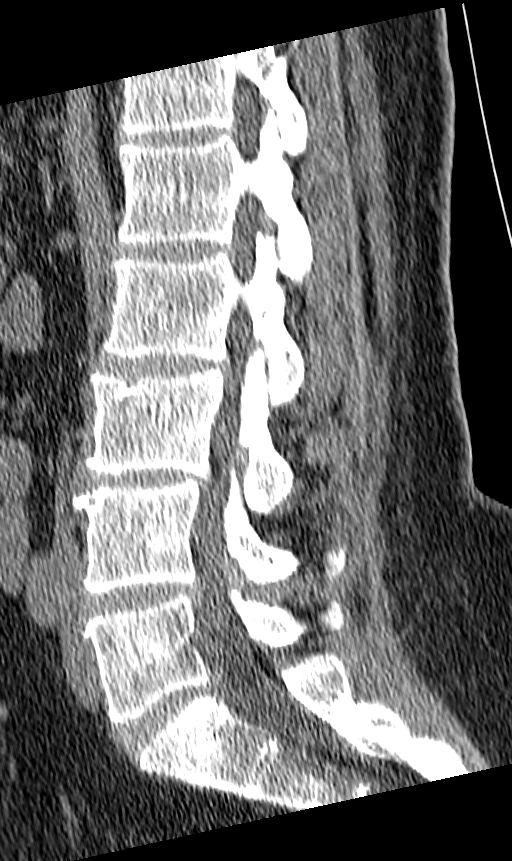
[im 34/68  bone]
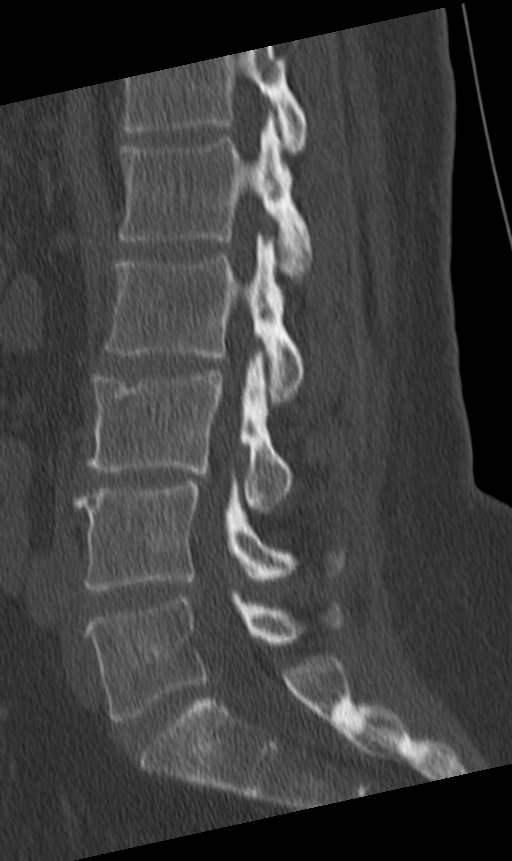
[im 40/68  bone]
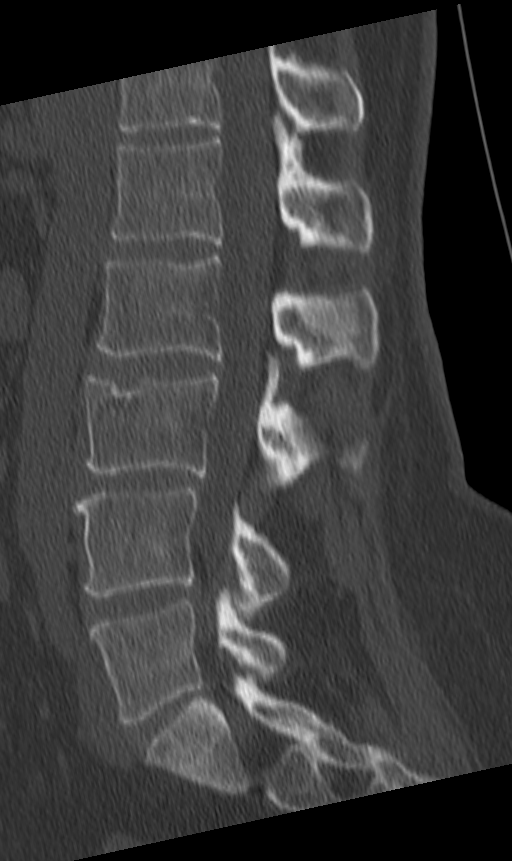
[im 45/68  bone]
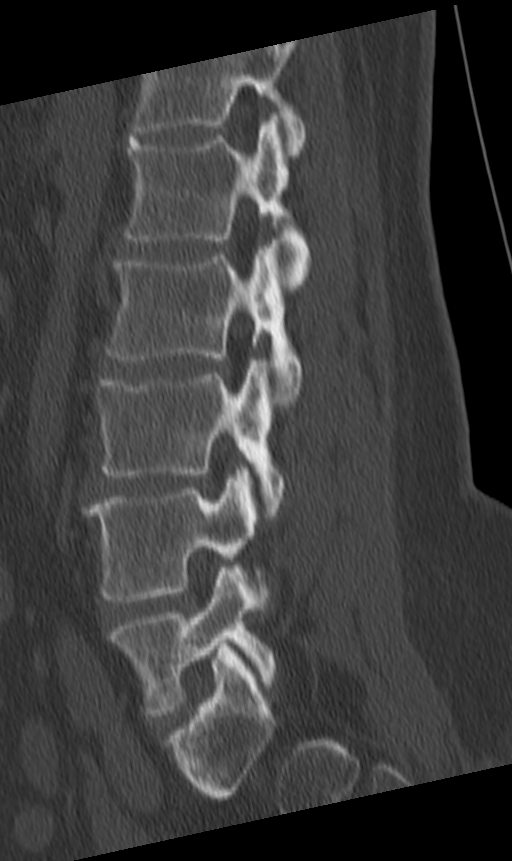

[10 of 33 positions shown; findings below may reference images not displayed]

FINDINGS: Last fully open disc space is labeled L5-S1.  Present
examination incorporates from T12-L1 disc space through the upper
sacrum.

T12-L1:  Facet joint bony overgrowth.  No significant spinal
stenosis or foraminal narrowing.

L1-2:  Mild facet joint bony overgrowth.  Minimal bulge slightly
greater right lateral position.  No significant spinal stenosis or
foraminal narrowing.

L2-3:  Schmorl's node deformity superior endplate L3.  Bulge.
Facet joint bony overgrowth.  Mild spinal stenosis.

L3-4:  Minimal retrolisthesis L3.  Bulge.  Very mild spinal
stenosis.

L4-5:  Bulge / shallow protrusion greater to the left.  Mild spinal
stenosis greater on the left.  Mild left lateral recess stenosis.
Mild facet joint degenerative changes.

L5-S1:  Mild facet joint degenerative changes.  Mild bulge/shallow
right paracentral protrusion.  No significant associated mass
effect.

Bilateral sacroiliac joint degenerative changes.
IMPRESSION: Mild degenerative changes as noted above without high-grade spinal
stenosis or foraminal narrowing.

## 2013-02-10 ENCOUNTER — Encounter: Payer: Self-pay | Admitting: *Deleted

## 2013-02-13 ENCOUNTER — Telehealth: Payer: Self-pay | Admitting: Internal Medicine

## 2013-02-13 ENCOUNTER — Other Ambulatory Visit: Payer: Self-pay | Admitting: Obstetrics and Gynecology

## 2013-02-13 ENCOUNTER — Ambulatory Visit: Payer: BC Managed Care – PPO | Admitting: Internal Medicine

## 2013-02-13 NOTE — Telephone Encounter (Signed)
Pt was a no show

## 2013-02-21 ENCOUNTER — Telehealth: Payer: Self-pay | Admitting: Internal Medicine

## 2013-02-21 NOTE — Telephone Encounter (Signed)
Looked up stool studies that were done at the hospital, they all came back negative. Pt is aware.

## 2013-02-21 NOTE — Telephone Encounter (Signed)
Patient is calling wanting stool study results that were done in the hospital, she hasnt heard anything, please advise?

## 2013-02-22 ENCOUNTER — Encounter (HOSPITAL_COMMUNITY): Payer: Self-pay | Admitting: *Deleted

## 2013-02-22 ENCOUNTER — Emergency Department (HOSPITAL_COMMUNITY): Payer: BC Managed Care – PPO

## 2013-02-22 ENCOUNTER — Emergency Department (HOSPITAL_COMMUNITY)
Admission: EM | Admit: 2013-02-22 | Discharge: 2013-02-22 | Disposition: A | Discharge status: Home / Self Care | Payer: BC Managed Care – PPO | Attending: Emergency Medicine | Admitting: Emergency Medicine

## 2013-02-22 DIAGNOSIS — Z7901 Long term (current) use of anticoagulants: Secondary | ICD-10-CM | POA: Insufficient documentation

## 2013-02-22 DIAGNOSIS — D693 Immune thrombocytopenic purpura: Secondary | ICD-10-CM | POA: Insufficient documentation

## 2013-02-22 DIAGNOSIS — R110 Nausea: Secondary | ICD-10-CM

## 2013-02-22 DIAGNOSIS — R197 Diarrhea, unspecified: Secondary | ICD-10-CM | POA: Insufficient documentation

## 2013-02-22 DIAGNOSIS — N289 Disorder of kidney and ureter, unspecified: Secondary | ICD-10-CM | POA: Insufficient documentation

## 2013-02-22 DIAGNOSIS — R002 Palpitations: Secondary | ICD-10-CM | POA: Insufficient documentation

## 2013-02-22 DIAGNOSIS — J45909 Unspecified asthma, uncomplicated: Secondary | ICD-10-CM | POA: Insufficient documentation

## 2013-02-22 DIAGNOSIS — R42 Dizziness and giddiness: Secondary | ICD-10-CM | POA: Insufficient documentation

## 2013-02-22 DIAGNOSIS — I10 Essential (primary) hypertension: Secondary | ICD-10-CM | POA: Insufficient documentation

## 2013-02-22 DIAGNOSIS — Z86711 Personal history of pulmonary embolism: Secondary | ICD-10-CM | POA: Insufficient documentation

## 2013-02-22 DIAGNOSIS — F3289 Other specified depressive episodes: Secondary | ICD-10-CM | POA: Insufficient documentation

## 2013-02-22 DIAGNOSIS — R0789 Other chest pain: Secondary | ICD-10-CM | POA: Insufficient documentation

## 2013-02-22 DIAGNOSIS — Z87891 Personal history of nicotine dependence: Secondary | ICD-10-CM | POA: Insufficient documentation

## 2013-02-22 DIAGNOSIS — Z79899 Other long term (current) drug therapy: Secondary | ICD-10-CM | POA: Insufficient documentation

## 2013-02-22 DIAGNOSIS — F329 Major depressive disorder, single episode, unspecified: Secondary | ICD-10-CM | POA: Insufficient documentation

## 2013-02-22 DIAGNOSIS — R079 Chest pain, unspecified: Secondary | ICD-10-CM

## 2013-02-22 LAB — CBC WITH DIFFERENTIAL/PLATELET
Basophils Absolute: 0 10*3/uL (ref 0.0–0.1)
Basophils Relative: 0 % (ref 0–1)
Eosinophils Absolute: 0 10*3/uL (ref 0.0–0.7)
Eosinophils Relative: 0 % (ref 0–5)
HCT: 40.4 % (ref 36.0–46.0)
Hemoglobin: 12.6 g/dL (ref 12.0–15.0)
Lymphocytes Relative: 4 % — ABNORMAL LOW (ref 12–46)
Lymphs Abs: 0.5 10*3/uL — ABNORMAL LOW (ref 0.7–4.0)
MCH: 23.9 pg — ABNORMAL LOW (ref 26.0–34.0)
MCHC: 31.2 g/dL (ref 30.0–36.0)
MCV: 76.7 fL — ABNORMAL LOW (ref 78.0–100.0)
Monocytes Absolute: 0.2 10*3/uL (ref 0.1–1.0)
Monocytes Relative: 2 % — ABNORMAL LOW (ref 3–12)
Neutro Abs: 11.1 10*3/uL — ABNORMAL HIGH (ref 1.7–7.7)
Neutrophils Relative %: 94 % — ABNORMAL HIGH (ref 43–77)
Platelets: 231 10*3/uL (ref 150–400)
RBC: 5.27 MIL/uL — ABNORMAL HIGH (ref 3.87–5.11)
RDW: 20.2 % — ABNORMAL HIGH (ref 11.5–15.5)
WBC: 11.8 10*3/uL — ABNORMAL HIGH (ref 4.0–10.5)

## 2013-02-22 LAB — BASIC METABOLIC PANEL
BUN: 33 mg/dL — ABNORMAL HIGH (ref 6–23)
CO2: 23 mEq/L (ref 19–32)
Calcium: 9.4 mg/dL (ref 8.4–10.5)
Chloride: 97 mEq/L (ref 96–112)
Creatinine, Ser: 1.42 mg/dL — ABNORMAL HIGH (ref 0.50–1.10)
GFR calc Af Amer: 50 mL/min — ABNORMAL LOW (ref >90)
GFR calc non Af Amer: 43 mL/min — ABNORMAL LOW (ref >90)
Glucose, Bld: 212 mg/dL — ABNORMAL HIGH (ref 70–99)
Potassium: 3.2 mEq/L — ABNORMAL LOW (ref 3.5–5.1)
Sodium: 137 mEq/L (ref 135–145)

## 2013-02-22 LAB — TROPONIN I: Troponin I: <0.3 ng/mL (ref <0.30)

## 2013-02-22 MED ORDER — MORPHINE SULFATE 4 MG/ML IJ SOLN: 6.0000 mg | Freq: Once | INTRAMUSCULAR | Status: DC

## 2013-02-22 MED ORDER — ONDANSETRON HCL 4 MG/2ML IJ SOLN
4.0000 mg | Freq: Once | INTRAMUSCULAR | Status: AC
  Administered 2013-02-22: 4 mg via INTRAVENOUS
  Filled 2013-02-22: qty 2

## 2013-02-22 MED ORDER — SODIUM CHLORIDE 0.9 % IV BOLUS (SEPSIS)
1000.0000 mL | Freq: Once | INTRAVENOUS | Status: AC
  Administered 2013-02-22: 1000 mL via INTRAVENOUS

## 2013-02-22 MED ORDER — MORPHINE SULFATE 2 MG/ML IJ SOLN
6.0000 mg | Freq: Once | INTRAMUSCULAR | Status: DC
Start: 2013-02-22 — End: 2013-02-22

## 2013-02-22 MED ORDER — LORAZEPAM 2 MG/ML IJ SOLN
1.0000 mg | Freq: Once | INTRAMUSCULAR | Status: AC
  Administered 2013-02-22: 1 mg via INTRAVENOUS
  Filled 2013-02-22: qty 1

## 2013-02-22 MED ORDER — POTASSIUM CHLORIDE CRYS ER 20 MEQ PO TBCR
20.0000 meq | EXTENDED_RELEASE_TABLET | Freq: Once | ORAL | Status: AC
  Administered 2013-02-22: 20 meq via ORAL
  Filled 2013-02-22: qty 1

## 2013-02-22 MED ORDER — MORPHINE SULFATE 10 MG/ML IJ SOLN
INTRAMUSCULAR | Status: AC
  Administered 2013-02-22: 6 mg
  Filled 2013-02-22: qty 1

## 2013-02-22 NOTE — ED Notes (Signed)
Pt states Chest pain began during the middle of the night. Pt states dizziness, sob (for a few days), diarrhea (began yesterday afternoon x 4 today), pt states she has been sweating a lot and tingling to feet and legs. Pt states her heart rate stays high and she has an appt with Cardiologist.

## 2013-02-22 NOTE — ED Notes (Signed)
Primary RN says edp aware of increased bp and hr.  Pt is f/u with Dr. Domenic Polite Friday.

## 2013-02-24 ENCOUNTER — Ambulatory Visit (INDEPENDENT_AMBULATORY_CARE_PROVIDER_SITE_OTHER): Payer: BC Managed Care – PPO | Admitting: Cardiology

## 2013-02-24 ENCOUNTER — Encounter: Payer: Self-pay | Admitting: Cardiology

## 2013-02-24 ENCOUNTER — Telehealth: Payer: Self-pay | Admitting: *Deleted

## 2013-02-24 ENCOUNTER — Other Ambulatory Visit: Payer: Self-pay | Admitting: Gastroenterology

## 2013-02-24 VITALS — BP 152/112 | HR 125 | Ht 65.0 in | Wt 212.0 lb

## 2013-02-24 DIAGNOSIS — R9431 Abnormal electrocardiogram [ECG] [EKG]: Secondary | ICD-10-CM

## 2013-02-24 DIAGNOSIS — R000 Tachycardia, unspecified: Secondary | ICD-10-CM

## 2013-02-24 DIAGNOSIS — I2699 Other pulmonary embolism without acute cor pulmonale: Secondary | ICD-10-CM

## 2013-02-24 DIAGNOSIS — D693 Immune thrombocytopenic purpura: Secondary | ICD-10-CM

## 2013-02-24 DIAGNOSIS — I10 Essential (primary) hypertension: Secondary | ICD-10-CM

## 2013-02-24 MED ORDER — METOPROLOL SUCCINATE ER 50 MG PO TB24: 50.0000 mg | ORAL_TABLET | Freq: Every day | ORAL | Status: DC

## 2013-02-24 NOTE — Assessment & Plan Note (Signed)
On chronic treatment as noted above, to be followed by Dr. [name] going forward.

## 2013-02-24 NOTE — Telephone Encounter (Signed)
Pt called to advise at 3:50 her BP 175/121 with the pulse of 110, pt waited 4minutes later after resting and noted the BP went up to 189/127 pulse of 103, pt took while on phone 171/115 pulse of 101, pt wants to know what point she needs to be concerned, pt was seen by Dr Karle Starch today, took two of her triamterene this am, pt picked up new Rx of the metoprolol xl 50mg  at 10am this morning, took BP per noted dizziness and slight headache while she is standing, while sitting she does not feel the same, pt also notes she has a headache while moving around, in the sitting position she noted a cold sweat about 15-32minutes ago, pt has noted this symptom in the past

## 2013-02-24 NOTE — Telephone Encounter (Signed)
Reviewed patient's chart and medications. Will have her take additional metoprolol 25 mg now. She has xanax that she takes once a day, but is ordered TID prn. She is advised to take one xanax. Rest. If after two hours, HR and BP are still elevated, seek treatment in ER.  She is advised to eliminate caffeine from her diet.

## 2013-02-24 NOTE — Progress Notes (Signed)
Clinical Summary Summer Cook is a medically complex 47 y.o.female referred for cardiology consultation by [name]. History reviewed. She reports intermittently documented tachycardia over several months, but more prominent recently. Also has had significantly elevated blood pressures over the last month at least. She is on a diuretic which was recently intensified, no other antihypertensives.  She tells me that she underwent a previous cardiac evaluation by [facility], apparently had an echocardiogram as well as a stress test last year, results are unknown at this time. She is under the impression that she was told that she had a previous "silent heart attack." She has not maintained any regular cardiology followup since that time. Reports that she was previously on a beta blocker.  Recent lab work reviewed finding normal troponin I levels, hemoglobin 11.1, platelets 336, INR 1.8, potassium 3.9, BUN 25.7, creatinine 1.4, AST 23, ALT 67. ECG from 5/28 showed sinus tachycardia with poor R-wave progression - rule out anterior infarct pattern, left axis deviation, left atrial enlargement. Chest x-ray from 5/28 reported no acute process.  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 She is transitioning her hematology care to Dr. [name] soon. Has had previous assessment at [facility] as well. She denies any recent fevers or chills. Did have issues with recurring diarrhea and abdominal discomfort after colonoscopy back in April, abdominal CT noted to be normal at that time. She states her diarrhea has improved.   Allergies  Allergen Reactions  . Yellow Jacket Venom Swelling    Current Outpatient Prescriptions  Medication Sig Dispense Refill  . albuterol (PROVENTIL HFA;VENTOLIN HFA) 108 (90 BASE) MCG/ACT inhaler Inhale 2 puffs into the lungs every 6 (six) hours as needed for wheezing.      Marland Kitchen ALPRAZolam (XANAX) 1 MG tablet Take 1 mg by mouth 3 (three) times daily as needed  for anxiety.       . Cholecalciferol (VITAMIN D) 2000 UNITS CAPS Take 1 capsule by mouth daily.      Marland Kitchen escitalopram (LEXAPRO) 20 MG tablet Take 20 mg by mouth daily.      . ondansetron (ZOFRAN) 8 MG tablet Take 1 tablet (8 mg total) by mouth every 8 (eight) hours as needed for nausea.  20 tablet  0  . oxyCODONE-acetaminophen (PERCOCET/ROXICET) 5-325 MG per tablet Take 1 tablet by mouth every 6 (six) hours as needed for pain.      . predniSONE (DELTASONE) 10 MG tablet Take 45 mg by mouth daily.       Marland Kitchen triamterene-hydrochlorothiazide (DYAZIDE) 37.5-25 MG per capsule Take 2 capsules by mouth daily.       . vitamin B-12 (CYANOCOBALAMIN) 1000 MCG tablet Take 1,000 mcg by mouth 2 (two) times daily.       Marland Kitchen warfarin (COUMADIN) 1 MG tablet Take 3 mg by mouth daily.      . metoprolol succinate (TOPROL-XL) 50 MG 24 hr tablet Take 1 tablet (50 mg total) by mouth daily. Take with or immediately following a meal.  90 tablet  1  . [DISCONTINUED] prochlorperazine (COMPAZINE) 10 MG tablet Take 10 mg by mouth as needed. For nausea        No current facility-administered medications for this visit.    Past Medical History  Diagnosis Date  . Essential hypertension, benign   . 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  Last was Prednisone 40mg ; 2 wk in 10/2012.  She also was on Prednisone bridged with Cellcept briefly but stopped due to lack of insurance.   . Depression   . Thrush, oral 05/29/2011  . Steroid-induced diabetes   . Pulmonary embolism 04/2012  . Asthma   . Renal insufficiency     Past Surgical History  Procedure Laterality Date  . Cholecystectomy  2008  . Bone marrow biopsy    . 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 Colonoscopy with esophagogastroduodenoscopy (egd) N/A 01/04/2013    Dr. Gala Romney: esophageal plaques with +KOH, hh. Gastric antrum abnormal, bx reactive gastropathy. Anal canal  hemorrhoids, colonic tics, normal TI, single polyp (sessile serrated tubular adenoma). Next TCS 12/2017    Family History  Problem Relation Age of Onset  . Cervical cancer Mother   . Lung cancer Father   . Pneumonia Brother   . Cancer Paternal Grandmother 42    colon cancer, breast cancer  . Cancer Paternal Grandfather 59    colon cancer  . AAA (abdominal aortic aneurysm) Paternal Grandmother     Social History Summer Cook reports that she quit smoking about 2 years ago. Her smoking use included Cigarettes. She smoked 0.00 packs per day for 0 years. She has never used smokeless tobacco. Summer Cook reports that she does not drink alcohol.  Review of Systems As outlined above. Otherwise no complaints of frank dizziness or syncope. States that she sometimes feels "woozy" in her head. No headaches or visual changes. No recurring chest pain symptoms with any regularity. No orthopnea or PND.  Physical Examination Filed Vitals:   02/24/13 0849  BP: 152/112  Pulse: 125   Filed Weights   02/24/13 0849  Weight: 212 lb (96.163 kg)   Overweight somewhat cushingoid-looking woman in no acute distress. HEENT: Conjunctiva and lids normal, oropharynx clear. Neck: Supple, no obvious elevated JVP or carotid bruits, no thyromegaly. Lungs: Clear to auscultation, nonlabored breathing at rest. Cardiac: Rapid regular rate and rhythm, cannot exclude S3, no significant systolic murmur, no pericardial rub. Abdomen: Soft, nontender, bowel sounds present, no guarding or rebound. Extremities: Chronic appearing mild edema, distal pulses 1-2+. Skin: Warm and dry. Musculoskeletal: No kyphosis. Neuropsychiatric: Alert and oriented x3, affect grossly appropriate.   Problem List and Plan   Tachycardia Based on record review this looks to be intermittent over several months, recently more consistent. Typically sinus tachycardia is driven by some underlying metabolic or physiologic process, may not be a  primary cardiac issue. There is some ambiguity as to her previous cardiac evaluation however, and we need to obtain records from her prior assessment at [facility] last year. In light of her elevated blood pressure we will also be starting Toprol-XL 50 mg daily, will also obtain an echocardiogram to reassess cardiac structure and function in the short term. Followup arranged.  Hypertension Blood pressure significantly elevated recently. Patient denies any major change in her medications, has actually increased the dose of her diuretic. She has been on long term steroids which is a concern. May ultimately need further evaluation for metabolic causes.  PE (pulmonary embolism) Documented last year. Patient tells me that [name] plans to discontinue Coumadin over the next month or so. She states she has been compliant with her medications. Recurrent pulmonary emboli could lead to tachycardia, although she has not been documented to be hypoxic recently.  ITP (idiopathic thrombocytopenic purpura) On chronic treatment as noted above, to be followed by Dr. [name] going forward.    Satira Sark, M.D., F.A.C.C.

## 2013-02-24 NOTE — Telephone Encounter (Signed)
Spoke to [facility] NP and gave verbal orders to pt as follows:  1) TAKE HALF OF THE METOPROLOL (25MG ) NOW 2) NO CAFFEINE (COFFEE, TEA,SODA, CHOCOLATE)  3) HAVE TSH LABS DRAWN TODAY ORDERS WILL BE SENT TO [facility]-HOURS GIVEN) 4) INCREASE WATER INTAKE PER NOTED SLIGHTLY DEHYDRATED ON LAST LABS 5) TAKE ONE OF THE PRN XANAX NOW 6) REST 7) IF THE SXS DO NOT RESOLVE IN A FEW HOURS, SEEK MEDICAL ATTENTION IN THE ER  Pt understood all instructions, orders for TSH placed in chart

## 2013-02-24 NOTE — Assessment & Plan Note (Addendum)
Documented last year. Patient tells me that [name] plans to discontinue Coumadin over the next month or so. She states she has been compliant with her medications. Recurrent pulmonary emboli could lead to tachycardia, although she has not been documented to be hypoxic recently.

## 2013-02-24 NOTE — Assessment & Plan Note (Signed)
Blood pressure significantly elevated recently. Patient denies any major change in her medications, has actually increased the dose of her diuretic. She has been on long term steroids which is a concern. May ultimately need further evaluation for metabolic causes.

## 2013-02-24 NOTE — Assessment & Plan Note (Signed)
Based on record review this looks to be intermittent over several months, recently more consistent. Typically sinus tachycardia is driven by some underlying metabolic or physiologic process, may not be a primary cardiac issue. There is some ambiguity as to her previous cardiac evaluation however, and we need to obtain records from her prior assessment at [facility] last year. In light of her elevated blood pressure we will also be starting Toprol-XL 50 mg daily, will also obtain an echocardiogram to reassess cardiac structure and function in the short term. Followup arranged.

## 2013-02-24 NOTE — Patient Instructions (Addendum)
Your physician recommends that you schedule a follow-up appointment in: POST TEST  Your physician has requested that you have an echocardiogram. Echocardiography is a painless test that uses sound waves to create images of your heart. It provides your doctor with information about the size and shape of your heart and how well your heart's chambers and valves are working. This procedure takes approximately one hour. There are no restrictions for this procedure.  Your physician has recommended you make the following change in your medication:   1) START TOPROL XL 50MG  ONCE DAILY

## 2013-02-25 LAB — TSH: TSH: 0.804 u[IU]/mL (ref 0.350–4.500)

## 2013-02-27 ENCOUNTER — Encounter: Payer: Self-pay | Admitting: *Deleted

## 2013-02-27 ENCOUNTER — Other Ambulatory Visit (HOSPITAL_COMMUNITY): Payer: Self-pay | Admitting: Cardiology

## 2013-02-27 DIAGNOSIS — R9431 Abnormal electrocardiogram [ECG] [EKG]: Secondary | ICD-10-CM

## 2013-02-27 DIAGNOSIS — I471 Supraventricular tachycardia: Secondary | ICD-10-CM

## 2013-02-27 NOTE — ED Provider Notes (Signed)
History    47 year old female with multiple complaints. It is difficult to ascertain her most pressing issue. She is complaining of some chest pain. Started last night. "It just does not feel right." The sensation is relatively constant. No appreciable exacerbating or relieving factors. Patient is also complaining of tingling in her legs and feet. She feels that she is sweating more than she typically does. Palpitations. She says that her heart rate is usually elevated, but she's not quite sure this is the case. Having diarrhea for the past 4 or 5 days. No blood in her stool or melena. Intermittent shortness of breath. She's had it with both exertion and at rest. The patient exhibits some degree of depression and frustration over the way she feels. She feels like she has continually declined since started on steroids for over a year ago for ITP. Also history of pulmonary embolism is on Coumadin. She reports compliance with her medications. Reports previously scheduled upcoming cardiology appointment for further evaluation of some of her symptoms.  CSN: [id number]  Arrival date & time 02/22/13  1136   First MD Initiated Contact with Patient 02/22/13 1321      Chief Complaint  Patient presents with  . Chest Pain  . Dizziness    (Consider location/radiation/quality/duration/timing/severity/associated sxs/prior treatment) HPI  Past Medical History  Diagnosis Date  . Essential hypertension, benign   . 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  Last was Prednisone 40mg ; 2 wk in 10/2012.  She also was on Prednisone bridged with Cellcept briefly but stopped due to lack of insurance.   . Depression   . Thrush, oral 05/29/2011  . Steroid-induced diabetes   . Pulmonary embolism 04/2012  . Asthma   . Renal insufficiency     Past Surgical History  Procedure Laterality Date  . Cholecystectomy   2008  . Bone marrow biopsy    . 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 Colonoscopy with esophagogastroduodenoscopy (egd) N/A 01/04/2013    Dr. Gala Romney: esophageal plaques with +KOH, hh. Gastric antrum abnormal, bx reactive gastropathy. Anal canal hemorrhoids, colonic tics, normal TI, single polyp (sessile serrated tubular adenoma). Next TCS 12/2017    Family History  Problem Relation Age of Onset  . Cervical cancer Mother   . Lung cancer Father   . Pneumonia Brother   . Cancer Paternal Grandmother 15    colon cancer, breast cancer  . Cancer Paternal Grandfather 63    colon cancer  . AAA (abdominal aortic aneurysm) Paternal Grandmother     History  Substance Use Topics  . Smoking status: Former Smoker -- 0 years    Types: Cigarettes    Quit date: 02/15/2011  . Smokeless tobacco: Never Used  . Alcohol Use: No    OB History   Grav Para Term Preterm Abortions TAB SAB Ect Mult Living                  Review of Systems  All systems reviewed and negative, other than as noted in HPI.   Allergies  Yellow jacket venom  Home Medications   Current Outpatient Rx  Name  Route  Sig  Dispense  Refill  . albuterol (PROVENTIL HFA;VENTOLIN HFA) 108 (90 BASE) MCG/ACT inhaler   Inhalation   Inhale 2 puffs into the lungs every 6 (six) hours as needed for wheezing.         Marland Kitchen ALPRAZolam (XANAX) 1 MG tablet  Oral   Take 1 mg by mouth 3 (three) times daily as needed for anxiety.          . Cholecalciferol (VITAMIN D) 2000 UNITS CAPS   Oral   Take 1 capsule by mouth daily.         Marland Kitchen escitalopram (LEXAPRO) 20 MG tablet   Oral   Take 20 mg by mouth daily.         . ondansetron (ZOFRAN) 8 MG tablet   Oral   Take 1 tablet (8 mg total) by mouth every 8 (eight) hours as needed for nausea.   20 tablet   0   . predniSONE (DELTASONE) 10 MG tablet   Oral   Take 45 mg by mouth daily.          Marland Kitchen triamterene-hydrochlorothiazide (DYAZIDE) 37.5-25 MG per capsule   Oral   Take 2 capsules  by mouth daily.          . vitamin B-12 (CYANOCOBALAMIN) 1000 MCG tablet   Oral   Take 1,000 mcg by mouth 2 (two) times daily.          Marland Kitchen warfarin (COUMADIN) 1 MG tablet   Oral   Take 3 mg by mouth daily.         . metoprolol succinate (TOPROL-XL) 50 MG 24 hr tablet   Oral   Take 1 tablet (50 mg total) by mouth daily. Take with or immediately following a meal.   90 tablet   1   . oxyCODONE-acetaminophen (PERCOCET/ROXICET) 5-325 MG per tablet   Oral   Take 1 tablet by mouth every 6 (six) hours as needed for pain.           BP 146/122  Pulse 125  Temp(Src) 97.9 F (36.6 C) (Oral)  Resp 20  Ht 5\' 5"  (1.651 m)  Wt 213 lb (96.616 kg)  BMI 35.44 kg/m2  SpO2 97%  LMP 05/13/2011  Physical Exam  Nursing note and vitals reviewed. Constitutional: She is oriented to person, place, and time. No distress.  Laying in bed. No acute distress. Obese.  HENT:  Head: Normocephalic and atraumatic.  Eyes: Conjunctivae are normal. Right eye exhibits no discharge. Left eye exhibits no discharge.  Neck: Neck supple.  Cardiovascular: Regular rhythm and normal heart sounds.  Exam reveals no gallop and no friction rub.   No murmur heard. Tachycardic with regular rhythm  Pulmonary/Chest: Effort normal and breath sounds normal. No respiratory distress.  Abdominal: Soft. She exhibits no distension. There is no tenderness.  Musculoskeletal: She exhibits no edema and no tenderness.  Neurological: She is alert and oriented to person, place, and time. No cranial nerve deficit. She exhibits normal muscle tone. Coordination normal.  Skin: Skin is warm and dry.  Psychiatric: Her behavior is normal. Thought content normal.  Generally pleasant. She does seem mildly anxious though. Did begin crying and show some frustration concerning the way she feels and her inability to work at this time.    ED Course  Procedures (including critical care time)  Labs Reviewed  CBC WITH DIFFERENTIAL -  Abnormal; Notable for the following:    WBC 11.8 (*)    RBC 5.27 (*)    MCV 76.7 (*)    MCH 23.9 (*)    RDW 20.2 (*)    Neutrophils Relative % 94 (*)    Lymphocytes Relative 4 (*)    Monocytes Relative 2 (*)    Neutro Abs 11.1 (*)    Lymphs Abs  0.5 (*)    All other components within normal limits  BASIC METABOLIC PANEL - Abnormal; Notable for the following:    Potassium 3.2 (*)    Glucose, Bld 212 (*)    BUN 33 (*)    Creatinine, Ser 1.42 (*)    GFR calc non Af Amer 43 (*)    GFR calc Af Amer 50 (*)    All other components within normal limits  TROPONIN I   No results found.  EKG:  Rhythm: Sinus tachycardia Vent. rate 132 BPM PR interval 144 ms QRS duration 74 ms QT/QTc 300/444 ms Left atrial enlargement Poor R wave progression ST segments: Nonspecific ST changes Comparison: Little interval change from EKG from 08/2012   1. Nausea   2. Diarrhea   3. Palpitations   4. Chest pain       MDM  47 year old female with multiple complaints. Some of these are need of further evaluation, but have a low suspicion for emergent process causing any of them at this time. Her chest pain is vague and atypical for ACS. Her EKG shows sinus tachycardia. No overt ischemic changes. Her troponin is normal. She does have a history of pulmonary embolism on Coumadin. She is persistently tachycardic at rest, but I'm not quite sure what is driving this. She has no respiratory distress her oxygen saturations are good on room air. Her tachycardia has been ongoing issue. My suspicion for pulmonary embolism remains fairly low at this time. Patient states that she has a cardiology appointment within the next 2 days. I feel she is stable for discharge at this time for followup then. She relates that she is to establish a new hematology provider as well. Many of her issues are more appropriate to be evaluated by these providers.        Virgel Manifold, MD 02/27/13 [phone]

## 2013-02-28 ENCOUNTER — Other Ambulatory Visit: Payer: Self-pay

## 2013-02-28 ENCOUNTER — Emergency Department (HOSPITAL_COMMUNITY)
Admission: EM | Admit: 2013-02-28 | Discharge: 2013-02-28 | Disposition: A | Discharge status: Home / Self Care | Payer: BC Managed Care – PPO | Attending: Emergency Medicine | Admitting: Emergency Medicine

## 2013-02-28 ENCOUNTER — Emergency Department (HOSPITAL_COMMUNITY): Payer: BC Managed Care – PPO

## 2013-02-28 ENCOUNTER — Encounter (HOSPITAL_COMMUNITY): Payer: Self-pay | Admitting: *Deleted

## 2013-02-28 ENCOUNTER — Ambulatory Visit (HOSPITAL_COMMUNITY): Payer: BC Managed Care – PPO | Attending: Cardiology

## 2013-02-28 DIAGNOSIS — R000 Tachycardia, unspecified: Secondary | ICD-10-CM | POA: Insufficient documentation

## 2013-02-28 DIAGNOSIS — R42 Dizziness and giddiness: Secondary | ICD-10-CM | POA: Insufficient documentation

## 2013-02-28 DIAGNOSIS — I471 Supraventricular tachycardia: Secondary | ICD-10-CM

## 2013-02-28 DIAGNOSIS — I10 Essential (primary) hypertension: Secondary | ICD-10-CM | POA: Insufficient documentation

## 2013-02-28 DIAGNOSIS — Z87891 Personal history of nicotine dependence: Secondary | ICD-10-CM | POA: Insufficient documentation

## 2013-02-28 DIAGNOSIS — H539 Unspecified visual disturbance: Secondary | ICD-10-CM | POA: Insufficient documentation

## 2013-02-28 DIAGNOSIS — J45909 Unspecified asthma, uncomplicated: Secondary | ICD-10-CM | POA: Insufficient documentation

## 2013-02-28 DIAGNOSIS — Z79899 Other long term (current) drug therapy: Secondary | ICD-10-CM | POA: Insufficient documentation

## 2013-02-28 DIAGNOSIS — R9431 Abnormal electrocardiogram [ECG] [EKG]: Secondary | ICD-10-CM | POA: Insufficient documentation

## 2013-02-28 DIAGNOSIS — I472 Ventricular tachycardia, unspecified: Secondary | ICD-10-CM | POA: Insufficient documentation

## 2013-02-28 DIAGNOSIS — Z8639 Personal history of other endocrine, nutritional and metabolic disease: Secondary | ICD-10-CM | POA: Insufficient documentation

## 2013-02-28 DIAGNOSIS — R0602 Shortness of breath: Secondary | ICD-10-CM | POA: Insufficient documentation

## 2013-02-28 DIAGNOSIS — Z7901 Long term (current) use of anticoagulants: Secondary | ICD-10-CM | POA: Insufficient documentation

## 2013-02-28 DIAGNOSIS — R739 Hyperglycemia, unspecified: Secondary | ICD-10-CM

## 2013-02-28 DIAGNOSIS — Q279 Congenital malformation of peripheral vascular system, unspecified: Secondary | ICD-10-CM

## 2013-02-28 DIAGNOSIS — Z862 Personal history of diseases of the blood and blood-forming organs and certain disorders involving the immune mechanism: Secondary | ICD-10-CM | POA: Insufficient documentation

## 2013-02-28 DIAGNOSIS — R51 Headache: Secondary | ICD-10-CM | POA: Insufficient documentation

## 2013-02-28 DIAGNOSIS — R4182 Altered mental status, unspecified: Secondary | ICD-10-CM | POA: Insufficient documentation

## 2013-02-28 DIAGNOSIS — Z87448 Personal history of other diseases of urinary system: Secondary | ICD-10-CM | POA: Insufficient documentation

## 2013-02-28 DIAGNOSIS — I4729 Other ventricular tachycardia: Secondary | ICD-10-CM | POA: Insufficient documentation

## 2013-02-28 DIAGNOSIS — R7309 Other abnormal glucose: Secondary | ICD-10-CM | POA: Insufficient documentation

## 2013-02-28 DIAGNOSIS — Z8619 Personal history of other infectious and parasitic diseases: Secondary | ICD-10-CM | POA: Insufficient documentation

## 2013-02-28 DIAGNOSIS — Z86718 Personal history of other venous thrombosis and embolism: Secondary | ICD-10-CM | POA: Insufficient documentation

## 2013-02-28 LAB — CBC
HCT: 36 % (ref 36.0–46.0)
MCHC: 30.8 g/dL (ref 30.0–36.0)
MCV: 78.1 fL (ref 78.0–100.0)
RDW: 20.2 % — ABNORMAL HIGH (ref 11.5–15.5)

## 2013-02-28 LAB — BASIC METABOLIC PANEL
BUN: 32 mg/dL — ABNORMAL HIGH (ref 6–23)
Creatinine, Ser: 1.48 mg/dL — ABNORMAL HIGH (ref 0.50–1.10)
GFR calc Af Amer: 48 mL/min — ABNORMAL LOW (ref >90)
GFR calc non Af Amer: 41 mL/min — ABNORMAL LOW (ref >90)

## 2013-02-28 LAB — POCT I-STAT TROPONIN I

## 2013-02-28 LAB — GLUCOSE, CAPILLARY

## 2013-02-28 MED ORDER — SODIUM CHLORIDE 0.9 % IV BOLUS (SEPSIS)
1000.0000 mL | Freq: Once | INTRAVENOUS | Status: AC
  Administered 2013-02-28: 1000 mL via INTRAVENOUS

## 2013-02-28 MED ORDER — ONDANSETRON HCL 8 MG PO TABS: 8.0000 mg | ORAL_TABLET | Freq: Three times a day (TID) | ORAL | Status: DC | PRN

## 2013-02-28 MED ORDER — KETOROLAC TROMETHAMINE 30 MG/ML IJ SOLN
30.0000 mg | Freq: Once | INTRAMUSCULAR | Status: AC
  Administered 2013-02-28: 30 mg via INTRAVENOUS
  Filled 2013-02-28: qty 1

## 2013-02-28 MED ORDER — INSULIN ASPART 100 UNIT/ML ~~LOC~~ SOLN: 10.0000 [IU] | Freq: Once | SUBCUTANEOUS | Status: DC

## 2013-02-28 NOTE — ED Provider Notes (Signed)
Medical screening examination/treatment/procedure(s) were conducted as a shared visit with non-physician practitioner(s) and myself.  I personally evaluated the patient during the encounter  The patient presents with complaints of dizziness and disorientation. She now is also complaining some dyspnea. Initial evaluation does not show evidence of pulmonary edema or pneumonia. Patient is hyperglycemic which could be contributing to her symptoms. She does not take her insulin as she is supposed to. We encouraged her her medications as prescribed.   MRI does not show any evidence of stroke. Incidental finding noted on MRI that likely contributing to her symptoms at this point. We will refer her to neurology for further outpatient consultation  Kathalene Frames, MD 02/28/13 1525

## 2013-02-28 NOTE — ED Provider Notes (Signed)
History     CSN: [id number]  Arrival date & time 02/28/13  [id number]   First MD Initiated Contact with Patient 02/28/13 1005      Chief Complaint  Patient presents with  . Dizziness    (Consider location/radiation/quality/duration/timing/severity/associated sxs/prior treatment) HPI Comments: 47 y/o female with a PMHx of hypertension, ITP, depression, PE, steroid induced diabetes, asthma and renal insufficiency presents to the ED complaining of dizziness. Patient states while she was driving home from her echocardiogram she began to feel dizzy, as if she was spinning with blurred vision, pulled over and had her aunt drive to the ED. Initially she felt disoriented and confused with a frontal "band-like" headache, however she is no longer confused or disoriented, just with headache. Admits to vague chest pain located in the center of her chest that has been present since being seen at [facility] ED 5/28. She followed up with her cardiologist 5/30 and was scheduled for an echo today. After looking at cardiology note from Dr. Domenic Polite, he started her on Toprol XL for tachycardia that has been present for a while. Admits to mild sob on exertion. Denies fever, chills, nausea, vomiting. No significant findings at prior ED visit on 5/28.   The history is provided by the patient and a relative.    Past Medical History  Diagnosis Date  . Essential hypertension, benign   . 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  Last was Prednisone 40mg ; 2 wk in 10/2012.  She also was on Prednisone bridged with Cellcept briefly but stopped due to lack of insurance.   . Depression   . Thrush, oral 05/29/2011  . Steroid-induced diabetes   . Pulmonary embolism 04/2012  . Asthma   . Renal insufficiency     Past Surgical History  Procedure Laterality Date  . Cholecystectomy  2008  . Bone marrow biopsy    .  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 Colonoscopy with esophagogastroduodenoscopy (egd) N/A 01/04/2013    Dr. Gala Romney: esophageal plaques with +KOH, hh. Gastric antrum abnormal, bx reactive gastropathy. Anal canal hemorrhoids, colonic tics, normal TI, single polyp (sessile serrated tubular adenoma). Next TCS 12/2017    Family History  Problem Relation Age of Onset  . Cervical cancer Mother   . Lung cancer Father   . Pneumonia Brother   . Cancer Paternal Grandmother 25    colon cancer, breast cancer  . Cancer Paternal Grandfather 42    colon cancer  . AAA (abdominal aortic aneurysm) Paternal Grandmother     History  Substance Use Topics  . Smoking status: Former Smoker -- 0 years    Types: Cigarettes    Quit date: 02/15/2011  . Smokeless tobacco: Never Used  . Alcohol Use: No    OB History   Grav Para Term Preterm Abortions TAB SAB Ect Mult Living                  Review of Systems  Constitutional: Negative for fever and chills.  Eyes: Positive for visual disturbance.  Respiratory: Positive for shortness of breath. Negative for cough.   Cardiovascular: Positive for chest pain. Negative for leg swelling.  Gastrointestinal: Negative for nausea, vomiting and abdominal pain.  Neurological: Positive for dizziness, light-headedness and headaches. Negative for facial asymmetry and speech difficulty.  Psychiatric/Behavioral: Positive for confusion.  All other systems reviewed and are negative.    Allergies  Yellow jacket venom  Home  Medications   Current Outpatient Rx  Name  Route  Sig  Dispense  Refill  . albuterol (PROVENTIL HFA;VENTOLIN HFA) 108 (90 BASE) MCG/ACT inhaler   Inhalation   Inhale 2 puffs into the lungs every 6 (six) hours as needed for wheezing.         Marland Kitchen ALPRAZolam (XANAX) 1 MG tablet   Oral   Take 1 mg by mouth 3 (three) times daily as needed for anxiety.          . Cholecalciferol (VITAMIN D) 2000 UNITS CAPS   Oral   Take 1 capsule by mouth daily.          Marland Kitchen escitalopram (LEXAPRO) 20 MG tablet   Oral   Take 20 mg by mouth daily.         . metoprolol succinate (TOPROL-XL) 50 MG 24 hr tablet   Oral   Take 1 tablet (50 mg total) by mouth daily. Take with or immediately following a meal.   90 tablet   1   . ondansetron (ZOFRAN) 8 MG tablet   Oral   Take 1 tablet (8 mg total) by mouth every 8 (eight) hours as needed for nausea.   20 tablet   0   . oxyCODONE-acetaminophen (PERCOCET/ROXICET) 5-325 MG per tablet   Oral   Take 1 tablet by mouth every 6 (six) hours as needed for pain.         . predniSONE (DELTASONE) 10 MG tablet   Oral   Take 45 mg by mouth daily.          Marland Kitchen triamterene-hydrochlorothiazide (DYAZIDE) 37.5-25 MG per capsule   Oral   Take 2 capsules by mouth daily.          . vitamin B-12 (CYANOCOBALAMIN) 1000 MCG tablet   Oral   Take 1,000 mcg by mouth 2 (two) times daily.          Marland Kitchen warfarin (COUMADIN) 1 MG tablet   Oral   Take 3 mg by mouth daily.           BP 121/80  Pulse 102  Temp(Src) 98.4 F (36.9 C) (Oral)  Resp 22  SpO2 96%  LMP 05/13/2011  Physical Exam  Nursing note and vitals reviewed. Constitutional: She is oriented to person, place, and time. She appears well-developed. No distress.  Obese  HENT:  Head: Normocephalic and atraumatic.  Mouth/Throat: Oropharynx is clear and moist.  Eyes: Conjunctivae are normal. Pupils are equal, round, and reactive to light.  Horizontal nystagmus on left lateral gaze.  Neck: Normal range of motion. Neck supple. No JVD present.  Cardiovascular: Regular rhythm, normal heart sounds and intact distal pulses.  Tachycardia present.  Exam reveals no gallop.   No murmur heard. Pulmonary/Chest: Effort normal and breath sounds normal. No respiratory distress. She has no wheezes. She has no rales. She exhibits no tenderness.  Abdominal: Soft. Bowel sounds are normal. There is no tenderness.  Musculoskeletal: Normal range of motion. She exhibits no  edema.  Neurological: She is alert and oriented to person, place, and time. She has normal strength. No cranial nerve deficit or sensory deficit. She displays a negative Romberg sign. Coordination and gait normal. GCS eye subscore is 4. GCS verbal subscore is 5. GCS motor subscore is 6.  Skin: Skin is warm and dry. She is not diaphoretic.  Psychiatric: She has a normal mood and affect. Her speech is normal and behavior is normal.    ED Course  Procedures (including  critical care time)  Labs Reviewed  GLUCOSE, CAPILLARY - Abnormal; Notable for the following:    Glucose-Capillary 431 (*)    All other components within normal limits  CBC  BASIC METABOLIC PANEL  PRO B NATRIURETIC PEPTIDE    Date: 02/28/2013  Rate: 101  Rhythm: sinus tachycardia and premature supraventricular complexes  QRS Axis: right  Intervals: normal  ST/T Wave abnormalities: normal  Conduction Disutrbances:none  Narrative Interpretation: no stemi, no sig changes since EKG from 02/23/2012 besides RAD from LAD  Old EKG Reviewed: changes noted RAD, previous LAD   Dg Chest 2 View  02/28/2013   *RADIOLOGY REPORT*  Clinical Data: Chest pain  CHEST - 2 VIEW  Comparison: 02/22/2013  Findings: The heart and pulmonary vascularity are within normal limits. The lungs are clear bilaterally.  No acute bony abnormality is seen.  An old compression deformity is noted in the lower thoracic spine.  IMPRESSION: No acute abnormality noted.   Original Report Authenticated By: Inez Catalina, M.D.   Mr Brain Wo Contrast  02/28/2013   *RADIOLOGY REPORT*  Clinical Data: 46 year old female with acute onset of dizziness.  MRI HEAD WITHOUT CONTRAST  Technique:  Multiplanar, multiecho pulse sequences of the brain and surrounding structures were obtained according to standard protocol without intravenous contrast.  Comparison: Head CTs 05/02/2012 and earlier.  Findings: There is an 8 mm area of chronic blood products associated with the left cerebellar  tonsil just to the left of midline (series 5 image 4, series 7 image 3).  There is no associated mass effect or surrounding edema.  There is associated mild diffusion artifact.  This finding was occult on all prior studies.   On coronal images the area is somewhat curvilinear more stellate (series 9 image 9).  No central increased T2 signal is identified.  No other areas of hemosiderin or abnormal signal identified in the brain.  No restricted diffusion to suggest acute infarction.  No ventriculomegaly.  Normal cerebral volume. No acute intracranial hemorrhage identified.  Negative pituitary, cervicomedullary junction visualized cervical spine. Major intracranial vascular flow voids are preserved.  No noncontrast evidence of a left cerebellar developmental venous anomaly is identified.   Grossly normal visualized internal auditory structures.  The mastoids are clear.  Postoperative changes to the left globe.  Mild ethmoid sinus mucosal thickening.  Other paranasal sinuses are clear.  Normal bone marrow signal.  Negative scalp soft tissues.  IMPRESSION: 1. No acute intracranial abnormality. 2.  There is a small solitary area of chronic blood products in the left cerebellar tonsil.  I favor this is sequelae of a remote hemorrhage, with the main differential consideration being a solitary cerebral cavernous vascular malformation (but T2 signal is not typical). No evidence of recent hemorrhage. 3.  Normal noncontrast MRI appearance of the brain elsewhere.   Original Report Authenticated By: Roselyn Reef, M.D.     1. Dizziness   2. Vascular malformation   3. Hyperglycemia       MDM  47 y/o female with dizziness, vague chest pain, initial confusion. She is AAOx3 and in NAD in the ED. Neurologic exam unremarkable other than horizontal nystagmus on L lateral gaze. After looking through her chart, she has had a detailed workup of her symptoms besides any head imaging. Will obtain MRI brain to rule out any  intracranial pathology. CBG initially 431, decreased to 380 after about 500 mL, will continue fluids and monitor CBG. Symptoms could be coming from hyperglycemia. Patient is on chronic steroids  for ITP and does not take insulin on a daily basis. Case discussed with Dr. Tomi Bamberger who agrees with plan of care. 3:17 PM CBG 212 after fluids. 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 She will follow up with neurology. I believe her acute dizzy symptoms are coming from hyperglycemia and poor glucose control. I strongly advised her to check her glucose when she feels dizzy and to take her insulin as prescribed. She will also f/u with PCP. Close return precautions discussed. Patient stable for discharge. She states her understanding of this plan and is agreeable. Dr. Tomi Bamberger agreeable with plan.    Illene Labrador, PA-C 02/28/13 [place], PA-C 02/28/13 1536

## 2013-02-28 NOTE — ED Notes (Signed)
Pt remains in mri at this time

## 2013-02-28 NOTE — ED Notes (Signed)
Patient transported to X-ray 

## 2013-02-28 NOTE — ED Notes (Signed)
Pt given ice chips

## 2013-02-28 NOTE — Progress Notes (Signed)
Echocardiogram performed.  

## 2013-02-28 NOTE — ED Notes (Signed)
Pt reports chest pain while driving and then developed dizziness and disoriented.  Pt is oriented now, but does not know which hospital.  Pt reports sob.

## 2013-02-28 NOTE — ED Notes (Signed)
Pt returned from xray

## 2013-03-01 LAB — GLUCOSE, CAPILLARY: Glucose-Capillary: 212 mg/dL — ABNORMAL HIGH (ref 70–99)

## 2013-03-04 ENCOUNTER — Emergency Department (HOSPITAL_COMMUNITY): Payer: BC Managed Care – PPO

## 2013-03-04 ENCOUNTER — Emergency Department (HOSPITAL_COMMUNITY)
Admission: EM | Admit: 2013-03-04 | Discharge: 2013-03-04 | Disposition: A | Discharge status: Home / Self Care | Payer: BC Managed Care – PPO | Attending: Emergency Medicine | Admitting: Emergency Medicine

## 2013-03-04 ENCOUNTER — Encounter (HOSPITAL_COMMUNITY): Payer: Self-pay

## 2013-03-04 DIAGNOSIS — R079 Chest pain, unspecified: Secondary | ICD-10-CM | POA: Insufficient documentation

## 2013-03-04 DIAGNOSIS — Z87891 Personal history of nicotine dependence: Secondary | ICD-10-CM | POA: Insufficient documentation

## 2013-03-04 DIAGNOSIS — Z7901 Long term (current) use of anticoagulants: Secondary | ICD-10-CM | POA: Insufficient documentation

## 2013-03-04 DIAGNOSIS — R61 Generalized hyperhidrosis: Secondary | ICD-10-CM | POA: Insufficient documentation

## 2013-03-04 DIAGNOSIS — I10 Essential (primary) hypertension: Secondary | ICD-10-CM | POA: Insufficient documentation

## 2013-03-04 DIAGNOSIS — R609 Edema, unspecified: Secondary | ICD-10-CM | POA: Insufficient documentation

## 2013-03-04 DIAGNOSIS — IMO0002 Reserved for concepts with insufficient information to code with codable children: Secondary | ICD-10-CM | POA: Insufficient documentation

## 2013-03-04 DIAGNOSIS — E139 Other specified diabetes mellitus without complications: Secondary | ICD-10-CM | POA: Insufficient documentation

## 2013-03-04 DIAGNOSIS — D693 Immune thrombocytopenic purpura: Secondary | ICD-10-CM | POA: Insufficient documentation

## 2013-03-04 DIAGNOSIS — E669 Obesity, unspecified: Secondary | ICD-10-CM | POA: Insufficient documentation

## 2013-03-04 DIAGNOSIS — Z87448 Personal history of other diseases of urinary system: Secondary | ICD-10-CM | POA: Insufficient documentation

## 2013-03-04 DIAGNOSIS — J45909 Unspecified asthma, uncomplicated: Secondary | ICD-10-CM | POA: Insufficient documentation

## 2013-03-04 DIAGNOSIS — F329 Major depressive disorder, single episode, unspecified: Secondary | ICD-10-CM | POA: Insufficient documentation

## 2013-03-04 DIAGNOSIS — F3289 Other specified depressive episodes: Secondary | ICD-10-CM | POA: Insufficient documentation

## 2013-03-04 DIAGNOSIS — Z86711 Personal history of pulmonary embolism: Secondary | ICD-10-CM | POA: Insufficient documentation

## 2013-03-04 DIAGNOSIS — Z79899 Other long term (current) drug therapy: Secondary | ICD-10-CM | POA: Insufficient documentation

## 2013-03-04 DIAGNOSIS — Z8619 Personal history of other infectious and parasitic diseases: Secondary | ICD-10-CM | POA: Insufficient documentation

## 2013-03-04 DIAGNOSIS — T380X5A Adverse effect of glucocorticoids and synthetic analogues, initial encounter: Secondary | ICD-10-CM | POA: Insufficient documentation

## 2013-03-04 LAB — D-DIMER, QUANTITATIVE: D-Dimer, Quant: <0.27 ug/mL-FEU (ref 0.00–0.48)

## 2013-03-04 LAB — CBC WITH DIFFERENTIAL/PLATELET
Eosinophils Relative: 0 % (ref 0–5)
Lymphocytes Relative: 5 % — ABNORMAL LOW (ref 12–46)
Lymphs Abs: 0.7 10*3/uL (ref 0.7–4.0)
MCV: 79.5 fL (ref 78.0–100.0)
Neutro Abs: 12.6 10*3/uL — ABNORMAL HIGH (ref 1.7–7.7)
Platelets: 176 10*3/uL (ref 150–400)
RBC: 4.69 MIL/uL (ref 3.87–5.11)
WBC: 13.7 10*3/uL — ABNORMAL HIGH (ref 4.0–10.5)

## 2013-03-04 LAB — BASIC METABOLIC PANEL
CO2: 29 mEq/L (ref 19–32)
Calcium: 8.9 mg/dL (ref 8.4–10.5)
Chloride: 100 mEq/L (ref 96–112)
Sodium: 140 mEq/L (ref 135–145)

## 2013-03-04 LAB — TROPONIN I: Troponin I: <0.3 ng/mL (ref <0.30)

## 2013-03-04 MED ORDER — FUROSEMIDE 20 MG PO TABS: 20.0000 mg | ORAL_TABLET | Freq: Two times a day (BID) | ORAL | Status: DC

## 2013-03-04 NOTE — ED Notes (Signed)
Pt's family arrived w/ food from [employer] dr.cook stated pt could eat.  Dr.cook stated he will be to see pt/family to review labs shortly.   Family notified.

## 2013-03-04 NOTE — ED Notes (Signed)
Pt given diet coke and warm blanket.  Multiple family members at bedside.

## 2013-03-04 NOTE — ED Notes (Signed)
Pt reports being sick w/ sob for 3-4 days. bil leg swelling-which she states is getting worse and "not going down at night", she reports a history of swelling when she has been on higher doses of prednisone -she is on a low dose of 30mg  and is afraid that it may be causing her swelling.

## 2013-03-04 NOTE — ED Notes (Signed)
Dr.cook to see pt.

## 2013-03-04 NOTE — ED Provider Notes (Signed)
History    This chart was scribed for [name], MD by Kathreen Cornfield, ED Scribe. The patient was seen in room APA18/APA18 and the patient's care was started at 9:35AM.    CSN: [id number]  Arrival date & time 03/04/13  0931   First MD Initiated Contact with Patient 03/04/13 0935      Chief Complaint  Patient presents with  . Shortness of Breath    3-4 days  . Fall    yesterday    (Consider location/radiation/quality/duration/timing/severity/associated sxs/prior treatment) The history is provided by the patient. No language interpreter was used.    Summer Cook is a 47 y.o. female , with a hx of hypertension, ITP, depression, PE, steroid induced diabetes, asthma, renal insufficiency, and cholecystectomy , who presents to the Emergency Department complaining of gradual, progressively worsening, shortness of breath, onset four days ago (02/28/13).  Associated symptoms include bilateral lower extremity edema and sweats, beginning yesterday (03/03/13). The pt reports she has been experiencing a shortness of breath which has recently inhibited her ability to ambulate, 100 feet or less, within the last four days. Furthermore, the pt informs she recently received en echocadiogram at [facility], which returned normal results.  Also, the pt reports she has elevated her lower extremities, which she informs, does not provide relief of her lower extremity edema.  The pt denies ever having fluid collection on her lungs.   The pt is a former smoker. She does not drink alcohol   PCP is [name].    Past Medical History  Diagnosis Date  . Essential hypertension, benign   . 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  Last was Prednisone 40mg ; 2 wk in 10/2012.  She also was on Prednisone bridged with Cellcept briefly but stopped due to lack of insurance.   . Depression   . Thrush, oral  05/29/2011  . Steroid-induced diabetes   . Pulmonary embolism 04/2012  . Asthma   . Renal insufficiency     Past Surgical History  Procedure Laterality Date  . Cholecystectomy  2008  . Bone marrow biopsy    . 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 Colonoscopy with esophagogastroduodenoscopy (egd) N/A 01/04/2013    Dr. Gala Romney: esophageal plaques with +KOH, hh. Gastric antrum abnormal, bx reactive gastropathy. Anal canal hemorrhoids, colonic tics, normal TI, single polyp (sessile serrated tubular adenoma). Next TCS 12/2017    Family History  Problem Relation Age of Onset  . Cervical cancer Mother   . Lung cancer Father   . Pneumonia Brother   . Cancer Paternal Grandmother 84    colon cancer, breast cancer  . Cancer Paternal Grandfather 36    colon cancer  . AAA (abdominal aortic aneurysm) Paternal Grandmother     History  Substance Use Topics  . Smoking status: Former Smoker -- 0 years    Types: Cigarettes    Quit date: 02/15/2011  . Smokeless tobacco: Never Used  . Alcohol Use: No    OB History   Grav Para Term Preterm Abortions TAB SAB Ect Mult Living                  Review of Systems  Constitutional: Positive for diaphoresis.  Respiratory: Positive for shortness of breath.   Cardiovascular: Positive for chest pain.  All other systems reviewed and are negative.    Allergies  Yellow jacket venom  Home Medications   Current Outpatient  Rx  Name  Route  Sig  Dispense  Refill  . albuterol (PROVENTIL HFA;VENTOLIN HFA) 108 (90 BASE) MCG/ACT inhaler   Inhalation   Inhale 2 puffs into the lungs every 6 (six) hours as needed for wheezing.         Marland Kitchen ALPRAZolam (XANAX) 1 MG tablet   Oral   Take 1 mg by mouth 3 (three) times daily as needed for anxiety.          . Cholecalciferol (VITAMIN D) 2000 UNITS CAPS   Oral   Take 1 capsule by mouth daily.         Marland Kitchen escitalopram (LEXAPRO) 20 MG tablet   Oral   Take 20 mg by mouth daily.         . metoprolol succinate  (TOPROL-XL) 50 MG 24 hr tablet   Oral   Take 50 mg by mouth daily. Take with or immediately following a meal.         . ondansetron (ZOFRAN) 8 MG tablet   Oral   Take 8 mg by mouth every 8 (eight) hours as needed for nausea.         Marland Kitchen oxyCODONE-acetaminophen (PERCOCET/ROXICET) 5-325 MG per tablet   Oral   Take 1 tablet by mouth every 6 (six) hours as needed for pain.         . predniSONE (DELTASONE) 10 MG tablet   Oral   Take 40 mg by mouth daily.          Marland Kitchen triamterene-hydrochlorothiazide (DYAZIDE) 37.5-25 MG per capsule   Oral   Take 2 capsules by mouth daily.          . vitamin B-12 (CYANOCOBALAMIN) 1000 MCG tablet   Oral   Take 1,000 mcg by mouth 2 (two) times daily.          Marland Kitchen warfarin (COUMADIN) 1 MG tablet   Oral   Take 3 mg by mouth daily.           BP 131/84  Pulse 91  Temp(Src) 98.7 F (37.1 C) (Oral)  Resp 24  Ht 5\' 5"  (1.651 m)  Wt 213 lb (96.616 kg)  BMI 35.44 kg/m2  SpO2 95%  LMP 05/13/2011  Physical Exam  Nursing note and vitals reviewed. Constitutional: She is oriented to person, place, and time. She appears well-developed and well-nourished.  Pt obese.   HENT:  Head: Normocephalic and atraumatic.  Eyes: Conjunctivae and EOM are normal. Pupils are equal, round, and reactive to light.  Neck: Normal range of motion. Neck supple.  Cardiovascular: Normal rate, regular rhythm and normal heart sounds.   Pulmonary/Chest: Effort normal and breath sounds normal.  Abdominal: Soft. Bowel sounds are normal.  Musculoskeletal: Normal range of motion. She exhibits edema (3+ peripheral).  Neurological: She is alert and oriented to person, place, and time.  Skin: Skin is warm. She is diaphoretic.  Psychiatric: She has a normal mood and affect.    ED Course  Procedures (including critical care time)  DIAGNOSTIC STUDIES: Oxygen Saturation is 95% on [place], normal by my interpretation.    COORDINATION OF CARE:   9:54 AM- Treatment plan  discussed with patient. Pt agrees with treatment.    Results for orders placed during the hospital encounter of [id number]  BASIC METABOLIC PANEL      Result Value Range   Sodium 140  135 - 145 mEq/L   Potassium 3.9  3.5 - 5.1 mEq/L   Chloride 100  96 - 112 mEq/L  CO2 29  19 - 32 mEq/L   Glucose, Bld 223 (*) 70 - 99 mg/dL   BUN 25 (*) 6 - 23 mg/dL   Creatinine, Ser 1.45 (*) 0.50 - 1.10 mg/dL   Calcium 8.9  8.4 - 10.5 mg/dL   GFR calc non Af Amer 42 (*) >90 mL/min   GFR calc Af Amer 49 (*) >90 mL/min  CBC WITH DIFFERENTIAL      Result Value Range   WBC 13.7 (*) 4.0 - 10.5 K/uL   RBC 4.69  3.87 - 5.11 MIL/uL   Hemoglobin 11.2 (*) 12.0 - 15.0 g/dL   HCT 37.3  36.0 - 46.0 %   MCV 79.5  78.0 - 100.0 fL   MCH 23.9 (*) 26.0 - 34.0 pg   MCHC 30.0  30.0 - 36.0 g/dL   RDW 19.6 (*) 11.5 - 15.5 %   Platelets 176  150 - 400 K/uL   Neutrophils Relative % 92 (*) 43 - 77 %   Neutro Abs 12.6 (*) 1.7 - 7.7 K/uL   Lymphocytes Relative 5 (*) 12 - 46 %   Lymphs Abs 0.7  0.7 - 4.0 K/uL   Monocytes Relative 3  3 - 12 %   Monocytes Absolute 0.4  0.1 - 1.0 K/uL   Eosinophils Relative 0  0 - 5 %   Eosinophils Absolute 0.0  0.0 - 0.7 K/uL   Basophils Relative 0  0 - 1 %   Basophils Absolute 0.0  0.0 - 0.1 K/uL  TROPONIN I      Result Value Range   Troponin I <0.30  <0.30 ng/mL  D-DIMER, QUANTITATIVE      Result Value Range   D-Dimer, Quant <0.27  0.00 - 0.48 ug/mL-FEU    Dg Chest Portable 1 View  03/04/2013   *RADIOLOGY REPORT*  Clinical Data: Short of breath, fall  PORTABLE CHEST - 1 VIEW  Comparison: Prior chest x-ray 02/28/2013  Findings: Mild prominence of the bibasilar interstitial markings similar to prior.  Unchanged cardiac and mediastinal contours.  No pneumothorax, pleural effusion or focal airspace consolidation.  No pulmonary edema.  No acute osseous abnormality.  IMPRESSION: No acute cardiopulmonary disease.   Original Report Authenticated By: Jacqulynn Cadet, M.D.        No  diagnosis found.   Date: 03/04/2013  Rate: 89  Rhythm: normal sinus rhythm  QRS Axis: normal  Intervals: normal  ST/T Wave abnormalities: normal  Conduction Disutrbances: none  Narrative Interpretation: unremarkable      MDM  Patient is hemodynamically stable.  Normal vital signs. Normal oxygenation.  Chest x-ray shows no pulmonary edema. She does have peripheral edema.   We'll start low-dose Lasix.   Patient has primary care followup      I personally performed the services described in this documentation, which was scribed in my presence. The recorded information has been reviewed and is accurate.    [name], MD 03/04/13 [phone]

## 2013-03-04 NOTE — ED Notes (Signed)
Pt ambulated to the restroom.

## 2013-03-04 NOTE — ED Notes (Signed)
Pt sleeping in bed at present, normal rise and fall of chest when observed.

## 2013-03-04 NOTE — ED Notes (Signed)
Pt reports sob for 3-4 days, has not followed up w/ her pmd.  Also has been falling a lot, last fell yesterday. Denies any injury from fall.

## 2013-03-04 NOTE — ED Notes (Signed)
Called pt to notify her that she left without receiving rx for lasix.  Pt states, "I think I will just go see my regular doctor on Monday."  Informed pt that rx could help with her breathing.  Pt states, "I am just going to rest and take it easy for the next few days."  Pt states she was upset with edp and does not want prescription.

## 2013-03-04 NOTE — ED Notes (Signed)
Pt left without receiving discharge instructions/perscriptions.

## 2013-03-08 ENCOUNTER — Encounter (HOSPITAL_COMMUNITY): Payer: BC Managed Care – PPO | Attending: Oncology

## 2013-03-08 VITALS — BP 128/88 | HR 109 | Temp 98.4°F | Resp 18 | Wt 219.0 lb

## 2013-03-08 DIAGNOSIS — D693 Immune thrombocytopenic purpura: Secondary | ICD-10-CM | POA: Insufficient documentation

## 2013-03-08 NOTE — Progress Notes (Addendum)
Progress Note   Summer Cook [id number] [birth year] 47 y.o. 03/08/2013  Diagnosis: ITP   HPI:  Summer Cook is a 47 year old woman with protracted history of ITP( immune thrombocytopenia).she has ITP for that protracted length of time.  He she has been previously seen by Dr. [name], however patient had received care from 2 other providers .  Reportedly she was seeing Dr. Truddie Coco who left and subsequently Dr. Lamonte Sakai who is reportedly  about to leave. Both were reportedly  in [place]  She was to reestablish care here.she states that she saw them for second opinion reasons.  According to her records, past therapy include: Prednisone, IVIg (transient response), Rituxan (no response), Cytoxan (no response). Last was Prednisone 40mg ; 2 wk in 10/2012. She also was on Prednisone bridged with Cellcept briefly but stopped due to lack of insurance. She had splenectomy in 2012.  She was last seen by Dr. Lamonte Sakai on 02/02/2013 according to my notes; "She preferred not to try CellCept again due to GI side effects. She would like to start Nplate. She would like to transfer her care to [facility] due to close proximity and since I am leaving the practice. I made the referral today. I went ahead and attempt to pre-approve Nplate for her so that she may start if Dr. [name] agrees with this option.  In the meantime, I recommended tapering down Prednisone to 45mg  PO qam. She still has weekly CBC check "presently she states that she is being on 30 mg of steroid since this week.  She denies any specific instruction on a steroid taper but states that she was told by her present hematologist to do so.  I have reviewed heart records into electronic health record system. Also noted he historic CBCs dating back to December 2011.she tells me that February of 2014 that  she's been on steroid and has had weekly CBCs since then. Platelet count has ranged from high of 572 K to nadir of 18 which was an isolated finding in  01/16/2013 otherwise platelet has been mostly above 100.K since December 2013.  She reports easy bruising he showed me a bruise where she had an IV last week.  She had a bone marrow aspiration and biopsy on 01/14/2011; which showed NORMOCELLULAR BONE MARROW FOR AGE WITH TRILINEAGE HEMATOPOIESIS. The bone marrow is normocellular for age with trilineage hematopoiesis including the presence of abundantmorphologically normal megakaryocytes. Significant dyspoiesis is not seen. In the presence ofthrombocytopenia, the bone marrow changes may be secondary to destruction, consumption or sequestration of platelets.    She has had multiple recent ER of visits and she states that she is waiting for referral from a primary her physician to see a neurologist. Recent MRI 16/3/14 had shown: small solitary area of chronic blood products in the left cerebellar tonsil.  PMH: Past Medical History  Diagnosis Date  . Essential hypertension, benign   . 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  Last was Prednisone 40mg ; 2 wk in 10/2012.  She also was on Prednisone bridged with Cellcept briefly but stopped due to lack of insurance.   . Depression   . Thrush, oral 05/29/2011  . Steroid-induced diabetes   . Pulmonary embolism 04/2012  . Asthma   . Renal insufficiency     Past Surgical History  Procedure Laterality Date  . Cholecystectomy  2008  . Bone marrow biopsy    . 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 Colonoscopy with esophagogastroduodenoscopy (egd) N/A 01/04/2013    Dr. Gala Romney: esophageal plaques with +KOH, hh. Gastric antrum abnormal, bx reactive gastropathy. Anal canal hemorrhoids, colonic tics, normal TI, single polyp (sessile serrated tubular adenoma). Next TCS 12/2017    Allergies: Allergies  Allergen Reactions  . Yellow Jacket Venom Swelling    Medications: Current outpatient  prescriptions:albuterol (PROVENTIL HFA;VENTOLIN HFA) 108 (90 BASE) MCG/ACT inhaler, Inhale 2 puffs into the lungs every 6 (six) hours as needed for wheezing., Disp: , Rfl: ;  ALPRAZolam (XANAX) 1 MG tablet, Take 1 mg by mouth 3 (three) times daily as needed for anxiety. , Disp: , Rfl: ;  Cholecalciferol (VITAMIN D) 2000 UNITS CAPS, Take 1 capsule by mouth daily., Disp: , Rfl:  escitalopram (LEXAPRO) 20 MG tablet, Take 20 mg by mouth daily., Disp: , Rfl: ;  metoprolol succinate (TOPROL-XL) 50 MG 24 hr tablet, Take 50 mg by mouth daily. Take with or immediately following a meal., Disp: , Rfl: ;  oxyCODONE-acetaminophen (PERCOCET/ROXICET) 5-325 MG per tablet, Take 1 tablet by mouth every 6 (six) hours as needed for pain., Disp: , Rfl: ;  predniSONE (DELTASONE) 10 MG tablet, Take 30 mg by mouth daily. , Disp: , Rfl:  triamterene-hydrochlorothiazide (DYAZIDE) 37.5-25 MG per capsule, Take 1 capsule by mouth daily. , Disp: , Rfl: ;  vitamin B-12 (CYANOCOBALAMIN) 1000 MCG tablet, Take 1,000 mcg by mouth 2 (two) times daily. , Disp: , Rfl: ;  warfarin (COUMADIN) 1 MG tablet, Take 3 mg by mouth daily., Disp: , Rfl: ;  furosemide (LASIX) 20 MG tablet, Take 1 tablet (20 mg total) by mouth 2 (two) times daily., Disp: 30 tablet, Rfl: 0 ondansetron (ZOFRAN) 8 MG tablet, Take 8 mg by mouth every 8 (eight) hours as needed for nausea., Disp: , Rfl: ;  [DISCONTINUED] prochlorperazine (COMPAZINE) 10 MG tablet, Take 10 mg by mouth as needed. For nausea , Disp: , Rfl:    Social History:   reports that she quit smoking about 2 years ago. Her smoking use included Cigarettes. She smoked 0.00 packs per day for 0 years. She has never used smokeless tobacco. She reports that she does not drink alcohol or use illicit drugs.  Family History: Family History  Problem Relation Age of Onset  . Cervical cancer Mother   . Lung cancer Father   . Pneumonia Brother   . Cancer Paternal Grandmother 73    colon cancer, breast cancer  .  Cancer Paternal Grandfather 71    colon cancer  . AAA (abdominal aortic aneurysm) Paternal Grandmother     Review of Systems: 14 point review of system is as in the history above otherwise negative.she has  Moon facie and weigh gain attributed to steroid.  Physical Exam: Blood pressure 128/88, pulse 109, temperature 98.4 F (36.9 C), temperature source Oral, resp. rate 18, weight 219 lb (99.338 kg), last menstrual period 05/13/2011. GENERAL: No distress, obese with moon facie and facial plethora.  SKIN:  Striae on the abdominal skin.ecchymosis noted on day right forearm. HEAD: Normocephalic, No masses, lesions, tenderness or abnormalities  EYES: Conjunctiva are pink and non-injected  ENT: External ears normal ,lips, buccal mucosa, and tongue normal and mucous membranes are moist  LYMPH: No palpable lymphadenopathy,neck supraclavicular or axillary areas.  There was supraclavicular  fullness on both sides but no masses palpable. LUNGS: clear to auscultation , no crackles or wheezes HEART: regular rate & rhythm, no murmurs, no gallops, S1 normal and S2 normal  ABDOMEN: Abdomen  soft, non-tender, normal bowel sounds, no masses or organomegaly and no hepatosplenomegaly  EXTREMITIES:1+ edema, no skin discoloration or tenderness NEURO: alert & oriented , no focal motor/sensory deficits.     Lab Results: Lab Results  Component Value Date   WBC 13.7* 03/04/2013   HGB 11.2* 03/04/2013   HCT 37.3 03/04/2013   MCV 79.5 03/04/2013   PLT 176 03/04/2013     Chemistry      Component Value Date/Time   NA 140 03/04/2013 1015   NA 140 02/02/2013 0840   K 3.9 03/04/2013 1015   K 3.9 02/02/2013 0840   CL 100 03/04/2013 1015   CL 97* 02/02/2013 0840   CO2 29 03/04/2013 1015   CO2 30* 02/02/2013 0840   BUN 25* 03/04/2013 1015   BUN 25.7 02/02/2013 0840   CREATININE 1.45* 03/04/2013 1015   CREATININE 1.4* 02/02/2013 0840      Component Value Date/Time   CALCIUM 8.9 03/04/2013 1015   CALCIUM 8.7 02/02/2013 0840   ALKPHOS 60  02/02/2013 0840   ALKPHOS 40 01/27/2013 0549   AST 23 02/02/2013 0840   AST 33 01/27/2013 0549   ALT 67* 02/02/2013 0840   ALT 94* 01/27/2013 0549   BILITOT 0.68 02/02/2013 0840   BILITOT 0.3 01/27/2013 0549         Radiological Studies: Dg Chest 2 View  02/28/2013   *RADIOLOGY REPORT*  Clinical Data: Chest pain  CHEST - 2 VIEW  Comparison: 02/22/2013  Findings: The heart and pulmonary vascularity are within normal limits. The lungs are clear bilaterally.  No acute bony abnormality is seen.  An old compression deformity is noted in the lower thoracic spine.  IMPRESSION: No acute abnormality noted.   Original Report Authenticated By: Inez Catalina, M.D.   Dg Chest 2 View  02/22/2013   *RADIOLOGY REPORT*  Clinical Data: Chest pain  CHEST - 2 VIEW  Comparison: None.  Findings: The heart and pulmonary vascularity are within normal limits. The lungs are free of acute infiltrate or sizable effusion. No acute bony abnormality is noted.  IMPRESSION: No acute abnormalities seen.   Original Report Authenticated By: Inez Catalina, M.D.   Mr Brain Wo Contrast  02/28/2013   *RADIOLOGY REPORT*  Clinical Data: 47 year old female with acute onset of dizziness.  MRI HEAD WITHOUT CONTRAST  Technique:  Multiplanar, multiecho pulse sequences of the brain and surrounding structures were obtained according to standard protocol without intravenous contrast.  Comparison: Head CTs 05/02/2012 and earlier.  Findings: There is an 8 mm area of chronic blood products associated with the left cerebellar tonsil just to the left of midline (series 5 image 4, series 7 image 3).  There is no associated mass effect or surrounding edema.  There is associated mild diffusion artifact.  This finding was occult on all prior studies.   On coronal images the area is somewhat curvilinear more stellate (series 9 image 9).  No central increased T2 signal is identified.  No other areas of hemosiderin or abnormal signal identified in the brain.  No restricted  diffusion to suggest acute infarction.  No ventriculomegaly.  Normal cerebral volume. No acute intracranial hemorrhage identified.  Negative pituitary, cervicomedullary junction visualized cervical spine. Major intracranial vascular flow voids are preserved.  No noncontrast evidence of a left cerebellar developmental venous anomaly is identified.   Grossly normal visualized internal auditory structures.  The mastoids are clear.  Postoperative changes to the left globe.  Mild ethmoid sinus mucosal thickening.  Other paranasal sinuses  are clear.  Normal bone marrow signal.  Negative scalp soft tissues.  IMPRESSION: 1. No acute intracranial abnormality. 2.  There is a small solitary area of chronic blood products in the left cerebellar tonsil.  I favor this is sequelae of a remote hemorrhage, with the main differential consideration being a solitary cerebral cavernous vascular malformation (but T2 signal is not typical). No evidence of recent hemorrhage. 3.  Normal noncontrast MRI appearance of the brain elsewhere.   Original Report Authenticated By: Roselyn Reef, M.D.   Dg Chest Portable 1 View  03/04/2013   *RADIOLOGY REPORT*  Clinical Data: Short of breath, fall  PORTABLE CHEST - 1 VIEW  Comparison: Prior chest x-ray 02/28/2013  Findings: Mild prominence of the bibasilar interstitial markings similar to prior.  Unchanged cardiac and mediastinal contours.  No pneumothorax, pleural effusion or focal airspace consolidation.  No pulmonary edema.  No acute osseous abnormality.  IMPRESSION: No acute cardiopulmonary disease.   Original Report Authenticated By: Jacqulynn Cadet, M.D.      Impression: Ms. Chastain has protracted history of ITP and has been on multiple therapies as outlined above.  Her disease seems steroid responsive everyday she does have some of the markers are chronic steroid therapy such as fissure changes, weight gain and fluid retention. Looking back at times to reach CBC is patient has done  really well on steroid going to 572,000 in March.  Think that patient can be titrated to a low dose steroids and wants maintain a reasonable platelet count and see the patient from some of the side effects of chronic high-dose steroid.She is presently on 30 milligram daily which can possibly be reduced to a level of 10 mg daily provided based since her dense level is above 30,000.  I don't think that she necessarily needs to have platelets of 200,000 at the cost of very high-dose steroid and side effects when she could possibly be maintained on a lower dose steroid at a safe platelet count.  We discussed TPO  Mimetics . I told her aboutt these options, however these are relatively new medications and long-term effects and that known. Association of Bone marrow fibrosis with these has been reported.  In view of the fact that she is steroid responsive, I think that trying a lower dose of steroid will more meaningful than a TPO agent which is not guaranteed to work.  Recommendations: 1.  I recommended that patient after 1 week of 30 mg  prednisone decreased to 20 mg daily for another week, then continue to until the  next followup in 4 weeks.  Her last CBC showed good platelet count of 176,000 last week. 2.  She will return to clinic in 4 weeks,I really do not see any need for weekly CBC .  I recommended that patient not get a CBC until her next visit and I educated her on the signs of relapse including but not limited to spontaneous bruising. She was  uncomfortable with this and subsequently I changed it to every 2 weeks until her next visit .     All questions were satisfactorily answered.She knows to call if she has any concern.  I spent 30 minutes counseling the patient face to face. The total time spent in the appointment was 40 minutes.   Verlan Friends, MD FACP. Hematology/Oncology.     Marland Kitchen

## 2013-03-08 NOTE — Patient Instructions (Addendum)
.  [facility] Discharge Instructions  RECOMMENDATIONS MADE BY THE CONSULTANT AND ANY TEST RESULTS WILL BE SENT TO YOUR REFERRING PHYSICIAN.  EXAM FINDINGS BY THE PHYSICIAN TODAY AND SIGNS OR SYMPTOMS TO REPORT TO CLINIC OR PRIMARY PHYSICIAN: We have looked at your labs over a period of time and the platelets are very stable  MEDICATIONS PRESCRIBED:  Prednisone 30 mg daily for 1 week Prednisone 20 mg daily for 1 week Prednisone 10 mg daily after that  Espino: Labs in 2 weeks and 4 weeks  SPECIAL INSTRUCTIONS/FOLLOW-UP: 4 weeks  Thank you for choosing [facility] to provide your oncology and hematology care.  To afford each patient quality time with our providers, please arrive at least 15 minutes before your scheduled appointment time.  With your help, our goal is to use those 15 minutes to complete the necessary work-up to ensure our physicians have the information they need to help with your evaluation and healthcare recommendations.    Effective January 1st, 2014, we ask that you re-schedule your appointment with our physicians should you arrive 10 or more minutes late for your appointment.  We strive to give you quality time with our providers, and arriving late affects you and other patients whose appointments are after yours.    Again, thank you for choosing [facility].  Our hope is that these requests will decrease the amount of time that you wait before being seen by our physicians.       _____________________________________________________________  Should you have questions after your visit to [facility], please contact our office at [phone] between the hours of 8:30 a.m. and 5:00 p.m.  Voicemails left after 4:30 p.m. will not be returned until the following business day.  For prescription refill requests, have your pharmacy contact our office with your prescription refill request.

## 2013-03-20 ENCOUNTER — Other Ambulatory Visit: Payer: Self-pay | Admitting: Obstetrics and Gynecology

## 2013-03-22 ENCOUNTER — Other Ambulatory Visit (HOSPITAL_COMMUNITY): Payer: BC Managed Care – PPO

## 2013-03-28 ENCOUNTER — Other Ambulatory Visit: Payer: Self-pay | Admitting: Gastroenterology

## 2013-04-04 ENCOUNTER — Ambulatory Visit (HOSPITAL_COMMUNITY): Payer: BC Managed Care – PPO

## 2013-04-04 ENCOUNTER — Other Ambulatory Visit (HOSPITAL_COMMUNITY): Payer: BC Managed Care – PPO

## 2013-04-05 ENCOUNTER — Emergency Department (HOSPITAL_COMMUNITY): Payer: BC Managed Care – PPO

## 2013-04-05 ENCOUNTER — Ambulatory Visit (INDEPENDENT_AMBULATORY_CARE_PROVIDER_SITE_OTHER): Payer: BC Managed Care – PPO | Admitting: Cardiology

## 2013-04-05 ENCOUNTER — Encounter (HOSPITAL_COMMUNITY): Payer: Self-pay

## 2013-04-05 ENCOUNTER — Emergency Department (HOSPITAL_COMMUNITY)
Admission: EM | Admit: 2013-04-05 | Discharge: 2013-04-05 | Disposition: A | Discharge status: Home / Self Care | Payer: BC Managed Care – PPO | Attending: Emergency Medicine | Admitting: Emergency Medicine

## 2013-04-05 ENCOUNTER — Encounter: Payer: Self-pay | Admitting: Cardiology

## 2013-04-05 VITALS — BP 138/92 | HR 140 | Ht 65.0 in | Wt 218.0 lb

## 2013-04-05 DIAGNOSIS — R197 Diarrhea, unspecified: Secondary | ICD-10-CM | POA: Insufficient documentation

## 2013-04-05 DIAGNOSIS — I10 Essential (primary) hypertension: Secondary | ICD-10-CM

## 2013-04-05 DIAGNOSIS — Z79899 Other long term (current) drug therapy: Secondary | ICD-10-CM | POA: Insufficient documentation

## 2013-04-05 DIAGNOSIS — J45909 Unspecified asthma, uncomplicated: Secondary | ICD-10-CM | POA: Insufficient documentation

## 2013-04-05 DIAGNOSIS — R5381 Other malaise: Secondary | ICD-10-CM | POA: Insufficient documentation

## 2013-04-05 DIAGNOSIS — Z9089 Acquired absence of other organs: Secondary | ICD-10-CM | POA: Insufficient documentation

## 2013-04-05 DIAGNOSIS — R51 Headache: Secondary | ICD-10-CM | POA: Insufficient documentation

## 2013-04-05 DIAGNOSIS — Z86711 Personal history of pulmonary embolism: Secondary | ICD-10-CM | POA: Insufficient documentation

## 2013-04-05 DIAGNOSIS — Z7901 Long term (current) use of anticoagulants: Secondary | ICD-10-CM | POA: Insufficient documentation

## 2013-04-05 DIAGNOSIS — R5383 Other fatigue: Secondary | ICD-10-CM | POA: Insufficient documentation

## 2013-04-05 DIAGNOSIS — F329 Major depressive disorder, single episode, unspecified: Secondary | ICD-10-CM | POA: Insufficient documentation

## 2013-04-05 DIAGNOSIS — Z862 Personal history of diseases of the blood and blood-forming organs and certain disorders involving the immune mechanism: Secondary | ICD-10-CM | POA: Insufficient documentation

## 2013-04-05 DIAGNOSIS — Z87891 Personal history of nicotine dependence: Secondary | ICD-10-CM | POA: Insufficient documentation

## 2013-04-05 DIAGNOSIS — R000 Tachycardia, unspecified: Secondary | ICD-10-CM

## 2013-04-05 DIAGNOSIS — I498 Other specified cardiac arrhythmias: Secondary | ICD-10-CM | POA: Insufficient documentation

## 2013-04-05 DIAGNOSIS — R109 Unspecified abdominal pain: Secondary | ICD-10-CM | POA: Insufficient documentation

## 2013-04-05 DIAGNOSIS — R42 Dizziness and giddiness: Secondary | ICD-10-CM

## 2013-04-05 DIAGNOSIS — Z8639 Personal history of other endocrine, nutritional and metabolic disease: Secondary | ICD-10-CM | POA: Insufficient documentation

## 2013-04-05 DIAGNOSIS — Z87448 Personal history of other diseases of urinary system: Secondary | ICD-10-CM | POA: Insufficient documentation

## 2013-04-05 DIAGNOSIS — Z8619 Personal history of other infectious and parasitic diseases: Secondary | ICD-10-CM | POA: Insufficient documentation

## 2013-04-05 DIAGNOSIS — H53149 Visual discomfort, unspecified: Secondary | ICD-10-CM | POA: Insufficient documentation

## 2013-04-05 DIAGNOSIS — Z9889 Other specified postprocedural states: Secondary | ICD-10-CM | POA: Insufficient documentation

## 2013-04-05 DIAGNOSIS — F3289 Other specified depressive episodes: Secondary | ICD-10-CM | POA: Insufficient documentation

## 2013-04-05 DIAGNOSIS — R002 Palpitations: Secondary | ICD-10-CM

## 2013-04-05 LAB — COMPREHENSIVE METABOLIC PANEL
ALT: 43 U/L — ABNORMAL HIGH (ref 0–35)
Alkaline Phosphatase: 39 U/L (ref 39–117)
BUN: 17 mg/dL (ref 6–23)
Chloride: 100 mEq/L (ref 96–112)
GFR calc Af Amer: 61 mL/min — ABNORMAL LOW (ref >90)
Glucose, Bld: 120 mg/dL — ABNORMAL HIGH (ref 70–99)
Potassium: 3.9 mEq/L (ref 3.5–5.1)
Sodium: 141 mEq/L (ref 135–145)
Total Bilirubin: 0.4 mg/dL (ref 0.3–1.2)

## 2013-04-05 LAB — CBC WITH DIFFERENTIAL/PLATELET
Hemoglobin: 13 g/dL (ref 12.0–15.0)
Lymphocytes Relative: 11 % — ABNORMAL LOW (ref 12–46)
Lymphs Abs: 1.2 10*3/uL (ref 0.7–4.0)
MCH: 23.9 pg — ABNORMAL LOW (ref 26.0–34.0)
Monocytes Relative: 6 % (ref 3–12)
Neutro Abs: 9.2 10*3/uL — ABNORMAL HIGH (ref 1.7–7.7)
Neutrophils Relative %: 82 % — ABNORMAL HIGH (ref 43–77)
RBC: 5.44 MIL/uL — ABNORMAL HIGH (ref 3.87–5.11)
WBC: 11.2 10*3/uL — ABNORMAL HIGH (ref 4.0–10.5)

## 2013-04-05 LAB — URINALYSIS, ROUTINE W REFLEX MICROSCOPIC
Bilirubin Urine: NEGATIVE
Ketones, ur: NEGATIVE mg/dL
Nitrite: NEGATIVE
Urobilinogen, UA: 0.2 mg/dL (ref 0.0–1.0)
pH: 6.5 (ref 5.0–8.0)

## 2013-04-05 LAB — TROPONIN I: Troponin I: <0.3 ng/mL (ref <0.30)

## 2013-04-05 LAB — PROTIME-INR: INR: 1.06 (ref 0.00–1.49)

## 2013-04-05 LAB — URINE MICROSCOPIC-ADD ON

## 2013-04-05 LAB — LIPASE, BLOOD: Lipase: 61 U/L — ABNORMAL HIGH (ref 11–59)

## 2013-04-05 MED ORDER — METOCLOPRAMIDE HCL 5 MG/ML IJ SOLN
5.0000 mg | Freq: Once | INTRAMUSCULAR | Status: AC
  Administered 2013-04-05: 5 mg via INTRAVENOUS
  Filled 2013-04-05: qty 2

## 2013-04-05 MED ORDER — SODIUM CHLORIDE 0.9 % IV BOLUS (SEPSIS)
500.0000 mL | Freq: Once | INTRAVENOUS | Status: AC
  Administered 2013-04-05: 500 mL via INTRAVENOUS

## 2013-04-05 MED ORDER — MECLIZINE HCL 25 MG PO TABS: 25.0000 mg | ORAL_TABLET | Freq: Four times a day (QID) | ORAL | Status: DC | PRN

## 2013-04-05 MED ORDER — TECHNETIUM TO 99M ALBUMIN AGGREGATED
6.0000 | Freq: Once | INTRAVENOUS | Status: AC | PRN
  Administered 2013-04-05: 6 via INTRAVENOUS

## 2013-04-05 MED ORDER — KETOROLAC TROMETHAMINE 30 MG/ML IJ SOLN
30.0000 mg | Freq: Once | INTRAMUSCULAR | Status: AC
  Administered 2013-04-05: 30 mg via INTRAVENOUS
  Filled 2013-04-05: qty 1

## 2013-04-05 MED ORDER — TECHNETIUM TC 99M DIETHYLENETRIAME-PENTAACETIC ACID
40.0000 | Freq: Once | INTRAVENOUS | Status: AC | PRN
  Administered 2013-04-05: 40 via RESPIRATORY_TRACT

## 2013-04-05 MED ORDER — METOPROLOL TARTRATE 1 MG/ML IV SOLN
5.0000 mg | Freq: Once | INTRAVENOUS | Status: AC
  Administered 2013-04-05: 5 mg via INTRAVENOUS
  Filled 2013-04-05: qty 5

## 2013-04-05 MED ORDER — IOHEXOL 350 MG/ML SOLN
100.0000 mL | Freq: Once | INTRAVENOUS | Status: AC | PRN
  Administered 2013-04-05: 100 mL via INTRAVENOUS

## 2013-04-05 MED ORDER — DIPHENHYDRAMINE HCL 50 MG/ML IJ SOLN
25.0000 mg | Freq: Once | INTRAMUSCULAR | Status: AC
  Administered 2013-04-05: 25 mg via INTRAVENOUS
  Filled 2013-04-05: qty 1

## 2013-04-05 NOTE — ED Provider Notes (Signed)
History    This chart was scribed for Orpah Greek, MD by Roxan Diesel, ED scribe.  This patient was seen in room APA16A/APA16A and the patient's care was started at 10:48 AM.  CSN: [id number] Arrival date & time 04/05/13  1032  None    Chief Complaint  Patient presents with  . Headache  . Dizziness  . Fatigue    The history is provided by the patient. No language interpreter was used.    HPI Comments: Summer Cook is a 47 y.o. female who presents to the Emergency Department complaining of 1 1/2 weeks of positional dizziness, moderate headache, and fatigue.  Pt states she gets dizzy when she stands up.  Headache is generalized but is worst across the front.  It is exacerbated by bright lights.  She states "I just don't have any energy."   She also reports some diarrhea and mild associated abdominal pain.  She denies fever, emesis, or any other associated symptoms.  Pt was seen by her cardiologist this morning and given an EKG which was normal.  She has not seen her PCP yet in regard to her symptoms.   Past Medical History  Diagnosis Date  . Essential hypertension, benign   . 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  Last was Prednisone 40mg ; 2 wk in 10/2012.  She also was on Prednisone bridged with Cellcept briefly but stopped due to lack of insurance.   . Depression   . Thrush, oral 05/29/2011  . Steroid-induced diabetes   . Pulmonary embolism 04/2012  . Asthma   . Renal insufficiency     Past Surgical History  Procedure Laterality Date  . Cholecystectomy  2008  . Bone marrow biopsy    . 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 Colonoscopy with esophagogastroduodenoscopy (egd) N/A 01/04/2013    Dr. Gala Romney: esophageal plaques with +KOH, hh. Gastric antrum abnormal, bx reactive gastropathy. Anal canal hemorrhoids, colonic tics, normal TI, single polyp (sessile serrated  tubular adenoma). Next TCS 12/2017    Family History  Problem Relation Age of Onset  . Cervical cancer Mother   . Lung cancer Father   . Pneumonia Brother   . Cancer Paternal Grandmother 16    colon cancer, breast cancer  . Cancer Paternal Grandfather 40    colon cancer  . AAA (abdominal aortic aneurysm) Paternal Grandmother     History  Substance Use Topics  . Smoking status: Former Smoker -- 0 years    Types: Cigarettes    Quit date: 02/15/2011  . Smokeless tobacco: Never Used  . Alcohol Use: No    OB History   Grav Para Term Preterm Abortions TAB SAB Ect Mult Living                   Review of Systems  Constitutional: Positive for fatigue. Negative for fever.  Eyes: Positive for photophobia.  Gastrointestinal: Positive for abdominal pain and diarrhea. Negative for vomiting.  Neurological: Positive for dizziness and headaches.  All other systems reviewed and are negative.      Allergies  Yellow jacket venom  Home Medications   Current Outpatient Rx  Name  Route  Sig  Dispense  Refill  . albuterol (PROVENTIL HFA;VENTOLIN HFA) 108 (90 BASE) MCG/ACT inhaler   Inhalation   Inhale 2 puffs into the lungs every 6 (six) hours as needed for wheezing.         Marland Kitchen  ALPRAZolam (XANAX) 1 MG tablet   Oral   Take 1 mg by mouth 3 (three) times daily as needed for anxiety.          . Cholecalciferol (VITAMIN D) 2000 UNITS CAPS   Oral   Take 1 capsule by mouth daily.         Marland Kitchen escitalopram (LEXAPRO) 20 MG tablet   Oral   Take 20 mg by mouth daily.         . furosemide (LASIX) 20 MG tablet   Oral   Take 1 tablet (20 mg total) by mouth 2 (two) times daily.   30 tablet   0   . HYDROcodone-acetaminophen (NORCO) 10-325 MG per tablet               . metoprolol succinate (TOPROL-XL) 50 MG 24 hr tablet   Oral   Take 50 mg by mouth daily. Take with or immediately following a meal.         . ondansetron (ZOFRAN) 8 MG tablet   Oral   Take 8 mg by mouth  every 8 (eight) hours as needed for nausea.         Marland Kitchen oxyCODONE-acetaminophen (PERCOCET/ROXICET) 5-325 MG per tablet   Oral   Take 1 tablet by mouth every 6 (six) hours as needed for pain.         . predniSONE (DELTASONE) 10 MG tablet   Oral   Take 30 mg by mouth daily.          Marland Kitchen triamterene-hydrochlorothiazide (DYAZIDE) 37.5-25 MG per capsule   Oral   Take 1 capsule by mouth daily.          . vitamin B-12 (CYANOCOBALAMIN) 1000 MCG tablet   Oral   Take 1,000 mcg by mouth 2 (two) times daily.          Marland Kitchen warfarin (COUMADIN) 1 MG tablet   Oral   Take 3 mg by mouth daily.          BP 167/129  Pulse 132  Temp(Src) 98.5 F (36.9 C) (Oral)  Resp 16  Ht 5\' 5"  (1.651 m)  Wt 218 lb (98.884 kg)  BMI 36.28 kg/m2  SpO2 92%  LMP 05/13/2011  Physical Exam  Nursing note and vitals reviewed. Constitutional: She is oriented to person, place, and time. She appears well-developed and well-nourished. No distress.  HENT:  Head: Normocephalic and atraumatic.  Right Ear: Hearing normal.  Left Ear: Hearing normal.  Nose: Nose normal.  Mouth/Throat: Oropharynx is clear and moist and mucous membranes are normal.  Eyes: Conjunctivae and EOM are normal. Pupils are equal, round, and reactive to light.  Neck: Normal range of motion. Neck supple.  Cardiovascular: Regular rhythm, S1 normal and S2 normal.  Exam reveals no gallop and no friction rub.   No murmur heard. Pulmonary/Chest: Effort normal and breath sounds normal. No respiratory distress. She exhibits no tenderness.  Abdominal: Soft. Normal appearance and bowel sounds are normal. There is no hepatosplenomegaly. There is no tenderness. There is no rebound, no guarding, no tenderness at McBurney's point and negative Murphy's sign. No hernia.  Musculoskeletal: Normal range of motion.  Neurological: She is alert and oriented to person, place, and time. She has normal strength. No cranial nerve deficit or sensory deficit.  Coordination normal. GCS eye subscore is 4. GCS verbal subscore is 5. GCS motor subscore is 6.  Skin: Skin is warm, dry and intact. No rash noted. No cyanosis.  Psychiatric: She has  a normal mood and affect. Her speech is normal and behavior is normal. Thought content normal.    ED Course  Procedures (including critical care time)  DIAGNOSTIC STUDIES: Oxygen Saturation is 92% on room air, low by my interpretation.    EKG:  Date: 04/05/2013  Rate: 126  Rhythm: sinus tachycardia  QRS Axis: left  Intervals: normal  ST/T Wave abnormalities: nonspecific ST/T changes  Conduction Disutrbances:none  Narrative Interpretation:   Old EKG Reviewed: tachycardia present    COORDINATION OF CARE: 10:53 AM-Discussed treatment plan which includes IV fluids, EKG, head CT and labs with pt at bedside and pt agreed to plan.     Labs Reviewed  CBC WITH DIFFERENTIAL - Abnormal; Notable for the following:    WBC 11.2 (*)    RBC 5.44 (*)    MCH 23.9 (*)    RDW 17.2 (*)    Platelets 120 (*)    Neutrophils Relative % 82 (*)    Neutro Abs 9.2 (*)    Lymphocytes Relative 11 (*)    All other components within normal limits  COMPREHENSIVE METABOLIC PANEL - Abnormal; Notable for the following:    Glucose, Bld 120 (*)    Creatinine, Ser 1.21 (*)    ALT 43 (*)    GFR calc non Af Amer 52 (*)    GFR calc Af Amer 61 (*)    All other components within normal limits  LIPASE, BLOOD - Abnormal; Notable for the following:    Lipase 61 (*)    All other components within normal limits  URINALYSIS, ROUTINE W REFLEX MICROSCOPIC - Abnormal; Notable for the following:    Hgb urine dipstick MODERATE (*)    Protein, ur TRACE (*)    All other components within normal limits  URINE MICROSCOPIC-ADD ON - Abnormal; Notable for the following:    Squamous Epithelial / LPF FEW (*)    All other components within normal limits   Ct Head Wo Contrast  04/05/2013   *RADIOLOGY REPORT*  Clinical Data:  1.5 weeks of  positional headache.  CT HEAD WITHOUT CONTRAST  Technique:  Contiguous axial images were obtained from the base of the skull through the vertex without contrast  Comparison:  MRI brain 02/28/2013.CT head 05/02/2012.  Findings:  The brain has a normal appearance without evidence for hemorrhage, acute infarction, hydrocephalus, or mass lesion.  There is no extra axial fluid collection.  The skull and paranasal sinuses are normal. Mild tonsillar ectopia is stable.  No change from priors.  IMPRESSION: Normal CT of the head without contrast.   Original Report Authenticated By: Rolla Flatten, M.D.    Diagnosis: 1. Sinus Tachycardia 2. Generalized weakness 3. Mild dehydration 4. Diarrhea  MDM  Patient presents to the ER for evaluation of generalized weakness, dizziness, headache. She has been more fatigued than usual for at least one week. Patient has a nonfocal examination. Reviewing her records reveals that she does have a history of PE. Patient is noted to be tachycardic. EKG is sinus tachycardia, no acute ischemia or infarct noted.  Patient does report diarrhea generalized GI discomfort over a period of a week or so. No rectal bleeding. Patient was possibly dehydrated causing the tachycardia, administered fluids.  With her history of PE, this has to be considered a possible cause of her tachycardia. CT angiography was performed. Unfortunately it was not a diagnostic study. Patient's INR is 1.06. She is therefore at increased risk for repeat PE. Patient will be scheduled for a  VQ scan. Will be signed out to Dr. Lacinda Axon to follow up and disposition.  I personally performed the services described in this documentation, which was scribed in my presence. The recorded information has been reviewed and is accurate.        Orpah Greek, MD 04/06/13 [phone]

## 2013-04-05 NOTE — ED Provider Notes (Signed)
Vital signs have normalized. Patient is alert and oriented with no neurological deficits. VQ scan shows no pulmonary embolism.  Discharge meds Antivert 25 mg #20  [name], MD 04/05/13 2041

## 2013-04-05 NOTE — Patient Instructions (Addendum)
Your physician recommends that you schedule a follow-up appointment in: TO BE DETERMINED  Your physician has recommended that you wear a holter monitor. Holter monitors are medical devices that record the heart's electrical activity. Doctors most often use these monitors to diagnose arrhythmias. Arrhythmias are problems with the speed or rhythm of the heartbeat. The monitor is a small, portable device. You can wear one while you do your normal daily activities. This is usually used to diagnose what is causing palpitations/syncope (passing out).WE WILL CALL YOU WITH THE RESULTS

## 2013-04-05 NOTE — Progress Notes (Signed)
Clinical Summary Ms. Bargas is a medically complex 47 y.o.female last seen in May of this year. She is here with her daughter and aunt. Reports feeling "bad," describes being tired, short of breath, no fevers or chills, sleeping a lot, sweating a lot. Still has ups and downs with blood pressure control - states that she has been taking her medications regularly. Symptoms seem to come in waves, has felt as way over the last few weeks. She has not seen her primary care provider or endocrinologist.  Echocardiogram from June showed normal LV chamber size and wall thickness with LVEF 55-60%, no regional wall motion abnormalities, grade 1 diastolic dysfunction, mild mitral regurgitation. Lab work in June showed hemoglobin 11.1, TSH 0.8, BNP 279, BUN 32, creatinine 1.4, d-dimer less than 0.27, troponin I less than 0.30. I reviewed these results with her.  We have not yet received records from prior workup with [facility].  ECG today shows sinus tachycardia at 140 with decreased R wave progression as before.  Allergies  Allergen Reactions  . Yellow Jacket Venom Swelling    Current Outpatient Prescriptions  Medication Sig Dispense Refill  . albuterol (PROVENTIL HFA;VENTOLIN HFA) 108 (90 BASE) MCG/ACT inhaler Inhale 2 puffs into the lungs every 6 (six) hours as needed for wheezing.      Marland Kitchen ALPRAZolam (XANAX) 1 MG tablet Take 1 mg by mouth 3 (three) times daily as needed for anxiety.       . Cholecalciferol (VITAMIN D) 2000 UNITS CAPS Take 1 capsule by mouth daily.      Marland Kitchen escitalopram (LEXAPRO) 20 MG tablet Take 20 mg by mouth daily.      . furosemide (LASIX) 20 MG tablet Take 1 tablet (20 mg total) by mouth 2 (two) times daily.  30 tablet  0  . HYDROcodone-acetaminophen (NORCO) 10-325 MG per tablet       . metoprolol succinate (TOPROL-XL) 50 MG 24 hr tablet Take 50 mg by mouth daily. Take with or immediately following a meal.      . ondansetron (ZOFRAN) 8 MG tablet Take 8 mg by mouth every 8 (eight) hours  as needed for nausea.      Marland Kitchen oxyCODONE-acetaminophen (PERCOCET/ROXICET) 5-325 MG per tablet Take 1 tablet by mouth every 6 (six) hours as needed for pain.      . predniSONE (DELTASONE) 10 MG tablet Take 30 mg by mouth daily.       Marland Kitchen triamterene-hydrochlorothiazide (DYAZIDE) 37.5-25 MG per capsule Take 1 capsule by mouth daily.       . vitamin B-12 (CYANOCOBALAMIN) 1000 MCG tablet Take 1,000 mcg by mouth 2 (two) times daily.       Marland Kitchen warfarin (COUMADIN) 1 MG tablet Take 3 mg by mouth daily.      . [DISCONTINUED] prochlorperazine (COMPAZINE) 10 MG tablet Take 10 mg by mouth as needed. For nausea        No current facility-administered medications for this visit.    Past Medical History  Diagnosis Date  . Essential hypertension, benign   . 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  Last was Prednisone 40mg ; 2 wk in 10/2012.  She also was on Prednisone bridged with Cellcept briefly but stopped due to lack of insurance.   . Depression   . Thrush, oral 05/29/2011  . Steroid-induced diabetes   . Pulmonary embolism 04/2012  . Asthma   . Renal insufficiency  Social History Ms. Cristo reports that she quit smoking about 2 years ago. Her smoking use included Cigarettes. She smoked 0.00 packs per day for 0 years. She has never used smokeless tobacco. Ms. Balthrop reports that she does not drink alcohol.  Review of Systems As outlined above.  Physical Examination Filed Vitals:   04/05/13 0921  BP: 138/92  Pulse: 140   Filed Weights   04/05/13 0921  Weight: 218 lb (98.884 kg)    Overweight somewhat cushingoid-looking woman.  HEENT: Conjunctiva and lids normal, oropharynx clear.  Neck: Supple, no obvious elevated JVP or carotid bruits, no thyromegaly.  Lungs: Clear to auscultation, nonlabored breathing at rest.  Cardiac: Rapid regular rate and rhythm, no significant systolic  murmur, no pericardial rub.  Abdomen: Soft, nontender, bowel sounds present, no guarding or rebound.  Extremities: Chronic appearing mild edema, distal pulses 1-2+.  Skin: Mildly diaphoretic.  Musculoskeletal: No kyphosis.  Neuropsychiatric: Alert and oriented x3, affect grossly appropriate.   Problem List and Plan   Tachycardia At this point I do not have a primary cardiac explanation for this and the above constellation of symptoms. Her last ECG in June showed sinus rhythm in the 80s, today she is in the 140s, and P wave morphology looks fairly similar. LVEF normal by recent echocardiogram, also recent normal documentation of TSH, hemoglobin, d-dimer, troponin I. She reports compliance with her medications. I would like to place a 48-hour Holter monitor to better assess heart rate variation, exclude the possibility of an ectopic atrial tachycardia with P-wave morphology similar to sinus, although doubt that this is the case. I have in the meanwhile encouraged her to see her primary care provider and also endocrinologist, as there may be underlying metabolic issues that better explain her presentation. It may be worth considering ruling her out for pheochromocytoma or other adrenal axis problems, particularly in light of her long-term steroid use.  Hypertension At this point no change to current regimen.    Satira Sark, M.D., F.A.C.C.

## 2013-04-05 NOTE — Assessment & Plan Note (Signed)
At this point I do not have a primary cardiac explanation for this and the above constellation of symptoms. Her last ECG in June showed sinus rhythm in the 80s, today she is in the 140s, and P wave morphology looks fairly similar. LVEF normal by recent echocardiogram, also recent normal documentation of TSH, hemoglobin, d-dimer, troponin I. She reports compliance with her medications. I would like to place a 48-hour Holter monitor to better assess heart rate variation, exclude the possibility of an ectopic atrial tachycardia with P-wave morphology similar to sinus, although doubt that this is the case. I have in the meanwhile encouraged her to see her primary care provider and also endocrinologist, as there may be underlying metabolic issues that better explain her presentation. It may be worth considering ruling her out for pheochromocytoma or other adrenal axis problems, particularly in light of her long-term steroid use.

## 2013-04-05 NOTE — Assessment & Plan Note (Signed)
At this point no change to current regimen.

## 2013-04-05 NOTE — ED Notes (Signed)
Pt asked to urinate. Pt stated they were unable at the moment.

## 2013-04-05 NOTE — ED Notes (Signed)
Pt complaining of having a head ache, rated 9/10, NAD noted.

## 2013-04-05 NOTE — ED Notes (Signed)
Pt alert & oriented x4, stable gait. Patient given discharge instructions, paperwork & prescription(s). Patient  instructed to stop at the registration desk to finish any additional paperwork. Patient verbalized understanding. Pt left department w/ no further questions. 

## 2013-04-05 NOTE — ED Notes (Signed)
Pt c/o headache, dizziness, and fatigue x 1 1/2 weeks.

## 2013-04-10 ENCOUNTER — Encounter (HOSPITAL_COMMUNITY): Payer: BC Managed Care – PPO

## 2013-04-11 ENCOUNTER — Ambulatory Visit: Payer: BC Managed Care – PPO | Admitting: Neurology

## 2013-04-12 MED ORDER — ONDANSETRON HCL 8 MG PO TABS: 8.0000 mg | ORAL_TABLET | Freq: Three times a day (TID) | ORAL | Status: DC | PRN

## 2013-04-13 ENCOUNTER — Ambulatory Visit (HOSPITAL_COMMUNITY)
Admission: RE | Admit: 2013-04-13 | Discharge: 2013-04-13 | Disposition: A | Discharge status: Home / Self Care | Payer: BC Managed Care – PPO | Source: Ambulatory Visit | Attending: Cardiology | Admitting: Cardiology

## 2013-04-13 ENCOUNTER — Other Ambulatory Visit (HOSPITAL_COMMUNITY): Payer: Self-pay | Admitting: Internal Medicine

## 2013-04-13 DIAGNOSIS — R000 Tachycardia, unspecified: Secondary | ICD-10-CM | POA: Insufficient documentation

## 2013-04-13 DIAGNOSIS — R002 Palpitations: Secondary | ICD-10-CM

## 2013-04-13 DIAGNOSIS — Z139 Encounter for screening, unspecified: Secondary | ICD-10-CM

## 2013-04-13 NOTE — Progress Notes (Signed)
48 hour Holter in progress.

## 2013-04-20 ENCOUNTER — Telehealth: Payer: Self-pay | Admitting: Gastroenterology

## 2013-04-20 NOTE — Telephone Encounter (Signed)
Information received from patient's insurance company stating patient has h/o prolonged QT interval and risk of taking Zofran. Info to be scanned into EPIC chart.  Would recommend patient use Zofran very sparingly, discontinue is the best option.

## 2013-04-24 ENCOUNTER — Ambulatory Visit (HOSPITAL_COMMUNITY): Payer: BC Managed Care – PPO

## 2013-04-24 ENCOUNTER — Other Ambulatory Visit (HOSPITAL_COMMUNITY): Payer: BC Managed Care – PPO

## 2013-04-25 ENCOUNTER — Encounter: Payer: Self-pay | Admitting: Neurology

## 2013-04-25 ENCOUNTER — Other Ambulatory Visit: Payer: Self-pay | Admitting: Obstetrics and Gynecology

## 2013-04-25 ENCOUNTER — Ambulatory Visit (INDEPENDENT_AMBULATORY_CARE_PROVIDER_SITE_OTHER): Payer: BC Managed Care – PPO | Admitting: Neurology

## 2013-04-25 VITALS — BP 119/80 | HR 63 | Temp 98.3°F | Ht 65.0 in | Wt 222.0 lb

## 2013-04-25 DIAGNOSIS — G43909 Migraine, unspecified, not intractable, without status migrainosus: Secondary | ICD-10-CM

## 2013-04-25 DIAGNOSIS — H8110 Benign paroxysmal vertigo, unspecified ear: Secondary | ICD-10-CM

## 2013-04-25 DIAGNOSIS — R519 Headache, unspecified: Secondary | ICD-10-CM

## 2013-04-25 DIAGNOSIS — R930 Abnormal findings on diagnostic imaging of skull and head, not elsewhere classified: Secondary | ICD-10-CM

## 2013-04-25 DIAGNOSIS — R51 Headache: Secondary | ICD-10-CM

## 2013-04-25 NOTE — Progress Notes (Signed)
NEUROLOGY CONSULTATION NOTE  Summer Cook MRN: [id number] DOB: 05/17/1966  Referring provider: Eulogio Bear, PA-C Primary care provider: [name]  Reason for consult:  Dizziness and abnormal MRI  HISTORY OF PRESENT ILLNESS: Summer Cook is a 47 y.o. female with history of ITP and subsequent PE (on warfarin) and chronic back pain, who presents for evaluation of dizziness with abnormal MRI.  Notes, tests and images reviewed.  She was experiencing several month history of dizziness, described as spinning sensation associated  with fatigue, shortness of breath, palpitations, cold sweat and occasional nausea.  It would occur with change in position or sometimes spontaneously.  It would last anywhere from seconds to 1-2 minutes.  No focal deficits.  No associated headache but does have history of migraines.  ECG did show sinus tachycardia in the 140s.  She was evaluated by cardiology.  2D Echo revealed LVEF 55-60%.  TSH 0.8, BNP 279, d-dimer <0.27.  Holter monitor revealed average HR 70 bpm with occasional sinus tachy.  No cardiac cause for symptoms could really be found.  She presented to the ED on 04/05/13 with worsening fatigue, dizziness and shortness of breath.  INR was 1.06.  Due to prior history of PE, CTA was performed which did not reveal PE, but exam was limited due to stopping of contrast.  V-Q scan was unremarkable.  INR in the past has been elevated (up to 7).  She does report episode last year of falling and hitting the back of her head.  She did have an MRI of the brain w/o gad performed on 02/28/13, which was personally reviewed.  It revealed a small 106mm area of chronic blood product in the left cerebellar tonsil, likely remote hemorrhage.  Report revealed possible differential could be a cerebral cavernous malformation, although it did not exhibit typical T2 signal.  No mass effect or edema.  She does have history of migraines, described as a throbbing pain, 10/10, in the back and  bifrontal regions, with associated photophobia and nausea. It would usually last a couple of days and would occur once a month. Outside of this, she has a less intense not throbbing headache. She doesn't take any medications for the headache but she does take hydrocodone daily for chronic back pain. She was recently started on metoprolol for tachycardia and has noted improvement in headaches.  PAST MEDICAL HISTORY: Past Medical History  Diagnosis Date  . Essential hypertension, benign   . 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  Last was Prednisone 40mg ; 2 wk in 10/2012.  She also was on Prednisone bridged with Cellcept briefly but stopped due to lack of insurance.   . Depression   . Thrush, oral 05/29/2011  . Steroid-induced diabetes   . Pulmonary embolism 04/2012  . Asthma   . Renal insufficiency     PAST SURGICAL HISTORY: Past Surgical History  Procedure Laterality Date  . Cholecystectomy  2008  . Bone marrow biopsy    . 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 Colonoscopy with esophagogastroduodenoscopy (egd) N/A 01/04/2013    Dr. Gala Romney: esophageal plaques with +KOH, hh. Gastric antrum abnormal, bx reactive gastropathy. Anal canal hemorrhoids, colonic tics, normal TI, single polyp (sessile serrated tubular adenoma). Next TCS 12/2017    MEDICATIONS: Current Outpatient Prescriptions on File Prior to Visit  Medication Sig Dispense Refill  . albuterol (PROVENTIL HFA;VENTOLIN HFA) 108 (90 BASE) MCG/ACT inhaler Inhale 2 puffs into the  lungs every 6 (six) hours as needed for wheezing or shortness of breath.       . ALPRAZolam (XANAX) 1 MG tablet Take 1 mg by mouth 3 (three) times daily as needed for anxiety.       . Cholecalciferol (VITAMIN D) 2000 UNITS CAPS Take 1 capsule by mouth daily.      Marland Kitchen escitalopram (LEXAPRO) 20 MG tablet Take 20 mg by mouth daily.      Marland Kitchen HYDROcodone-acetaminophen  (NORCO) 10-325 MG per tablet Take 1 tablet by mouth every 6 (six) hours as needed for pain.       . meclizine (ANTIVERT) 25 MG tablet Take 1 tablet (25 mg total) by mouth every 6 (six) hours as needed for dizziness.  20 tablet  0  . metoprolol succinate (TOPROL-XL) 50 MG 24 hr tablet Take 50 mg by mouth at bedtime. Take with or immediately following a meal.      . ondansetron (ZOFRAN) 8 MG tablet Take 1 tablet (8 mg total) by mouth every 8 (eight) hours as needed for nausea.  20 tablet  0  . oxyCODONE-acetaminophen (PERCOCET/ROXICET) 5-325 MG per tablet Take 1 tablet by mouth every 6 (six) hours as needed for pain.      . predniSONE (DELTASONE) 10 MG tablet Take 10 mg by mouth daily.       Marland Kitchen triamterene-hydrochlorothiazide (DYAZIDE) 37.5-25 MG per capsule Take 1 capsule by mouth every morning.       . warfarin (COUMADIN) 1 MG tablet Take 3 mg by mouth every evening.       . [DISCONTINUED] prochlorperazine (COMPAZINE) 10 MG tablet Take 10 mg by mouth as needed. For nausea        No current facility-administered medications on file prior to visit.    ALLERGIES: Allergies  Allergen Reactions  . Yellow Jacket Venom Swelling    FAMILY HISTORY: Family History  Problem Relation Age of Onset  . Cervical cancer Mother   . Lung cancer Father   . Pneumonia Brother   . Cancer Paternal Grandmother 60    colon cancer, breast cancer  . Cancer Paternal Grandfather 16    colon cancer  . AAA (abdominal aortic aneurysm) Paternal Grandmother     SOCIAL HISTORY: History   Social History  . Marital Status: Divorced    Spouse Name: N/A    Number of Children: 1  . Years of Education: N/A   Occupational History  .      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    Social History Main Topics  . Smoking status: Former Smoker -- 0 years    Types: Cigarettes    Quit date: 02/14/2009  . Smokeless tobacco: Never Used  . Alcohol Use: No  . Drug Use: No  . Sexually Active: No   Other Topics Concern  . Not on  file   Social History Narrative  . No narrative on file    REVIEW OF SYSTEMS: Constitutional: No fevers, chills, or sweats, no generalized fatigue, change in appetite Eyes: No visual changes, double vision, eye pain Ear, nose and throat: No hearing loss, ear pain, nasal congestion, sore throat Cardiovascular: No chest pain, palpitations Respiratory:  No shortness of breath at rest or with exertion, wheezes GastrointestinaI: No nausea, vomiting, diarrhea, abdominal pain, fecal incontinence Genitourinary:  No dysuria, urinary retention or frequency Musculoskeletal:  No neck pain, back pain Integumentary: No rash, pruritus, skin lesions Neurological: as above Psychiatric: No depression, insomnia, anxiety Endocrine: No palpitations, fatigue,  diaphoresis, mood swings, change in appetite, change in weight, increased thirst Hematologic/Lymphatic:  No anemia, purpura, petechiae. Allergic/Immunologic: no itchy/runny eyes, nasal congestion, recent allergic reactions, rashes  PHYSICAL EXAM: Filed Vitals:   04/25/13 0753  BP: 119/80  Pulse: 63  Temp: 98.3 F (36.8 C)   General: No acute distress Head:  Normocephalic/atraumatic Neck: supple, no paraspinal tenderness, full range of motion Back: No paraspinal tenderness Heart: regular rate and rhythm Lungs: Clear to auscultation bilaterally. Vascular: No carotid bruits. Neurological Exam: Mental status: alert and oriented to person, place, time and self, speech fluent and not dysarthric, language intact. Cranial nerves: CN I: not tested CN II: pupils equal, round and reactive to light, visual fields intact, fundi unremarkable. CN III, IV, VI:  full range of motion, no nystagmus, no ptosis CN V: facial sensation intact CN VII: upper and lower face symmetric CN VIII: hearing intact CN IX, X: gag intact, uvula midline CN XI: sternocleidomastoid and trapezius muscles intact CN XII: tongue midline Bulk & Tone: normal, no  fasciculations. Motor: 5/5 throughout Sensation: temperature and vibration intact Deep Tendon Reflexes: 2+ throughout, toes down Finger to nose testing: normal Heel to shin: normal Gait: normal, able to walk in tandem. Romberg with very mild sway.  IMPRESSION & PLAN: Summer Cook is a 47 y.o. female with:  1.  small area of chronic blood in cerebellum on MRI.  I think this is an incidental finding and unrelated to her symptoms.  May be cerebral cavernous malformation, or perhaps from head trauma a year ago or consequence of very high INR in the past. 2.  Vertigo spells sound like benign positional vertigo.  Likely an inner ear dysfunction rather than central neurologic etiology. 3.  Migraine without aura 4.  Chronic daily headaches.  Plan: 1.  I recommend repeat MRI of the brain in one year to make sure everything looks stable.  If you have worsening worrisome symptoms, it can be performed sooner. 2.  Continue metoprolol as it is helpful for migraine/headache prevention as well as heart rate. 3.  Limit pain meds if possible to prevent medication-overuse headache. 4.  Repeat MRI in one year can be done by your PCP.  If there is change on MRI, may follow up with me.  60 minutes with patient and family, over 50% spent counseling, coordinating care, and reviewing image.  Thank you for allowing me to take part in the care of this patient.  Metta Clines, DO  CC:  Redmond School, MD

## 2013-04-25 NOTE — Patient Instructions (Addendum)
I think the findings on MRI are incidental and not contributing to any of your symptoms.  It looks like a small area of old blood, which could be a cerebral cavernous malformation or mild bleed from head trauma or when you had an elevated INR.  I think the dizzy spells are an inner ear problem rather than a central neurologic problem.  Recommendations: 1.  Repeat MRI of the brain in one year to make sure everything looks stable.  If you have worsening worrisome symptoms, it can be performed sooner. 2.  You definitely have migraines and chronic daily headaches.  Continue metoprolol as it is helpful for migraine/headache prevention as well as heart rate. 3.  Limit pain meds if possible to prevent medication-overuse headache. 4.  Repeat MRI in one year can be done by your PCP, or you can follow up with me in one year to go over it.

## 2013-04-25 NOTE — Telephone Encounter (Signed)
Tried to call pt- NA 

## 2013-05-03 ENCOUNTER — Other Ambulatory Visit: Payer: Self-pay | Admitting: Hematology & Oncology

## 2013-05-03 ENCOUNTER — Other Ambulatory Visit: Payer: Self-pay | Admitting: Oncology

## 2013-05-03 DIAGNOSIS — D693 Immune thrombocytopenic purpura: Secondary | ICD-10-CM

## 2013-05-03 MED ORDER — PREDNISONE 10 MG PO TABS: 10.0000 mg | ORAL_TABLET | Freq: Every day | ORAL | Status: DC

## 2013-05-08 ENCOUNTER — Encounter (HOSPITAL_COMMUNITY): Payer: BC Managed Care – PPO | Attending: Oncology

## 2013-05-08 ENCOUNTER — Encounter (HOSPITAL_COMMUNITY): Payer: Self-pay

## 2013-05-08 VITALS — BP 159/101 | HR 71 | Temp 97.8°F | Resp 24 | Wt 230.8 lb

## 2013-05-08 DIAGNOSIS — Z86711 Personal history of pulmonary embolism: Secondary | ICD-10-CM

## 2013-05-08 DIAGNOSIS — R609 Edema, unspecified: Secondary | ICD-10-CM

## 2013-05-08 DIAGNOSIS — I10 Essential (primary) hypertension: Secondary | ICD-10-CM

## 2013-05-08 DIAGNOSIS — E860 Dehydration: Secondary | ICD-10-CM | POA: Insufficient documentation

## 2013-05-08 DIAGNOSIS — D693 Immune thrombocytopenic purpura: Secondary | ICD-10-CM

## 2013-05-08 LAB — CBC WITH DIFFERENTIAL/PLATELET
Basophils Relative: 0 % (ref 0–1)
Eosinophils Absolute: 0 10*3/uL (ref 0.0–0.7)
Eosinophils Relative: 0 % (ref 0–5)
HCT: 37 % (ref 36.0–46.0)
Hemoglobin: 11 g/dL — ABNORMAL LOW (ref 12.0–15.0)
Lymphs Abs: 0.7 10*3/uL (ref 0.7–4.0)
MCH: 23.6 pg — ABNORMAL LOW (ref 26.0–34.0)
MCHC: 29.7 g/dL — ABNORMAL LOW (ref 30.0–36.0)
MCV: 79.4 fL (ref 78.0–100.0)
Monocytes Absolute: 0.3 10*3/uL (ref 0.1–1.0)
Monocytes Relative: 4 % (ref 3–12)
Neutrophils Relative %: 89 % — ABNORMAL HIGH (ref 43–77)
RBC: 4.66 MIL/uL (ref 3.87–5.11)

## 2013-05-08 LAB — D-DIMER, QUANTITATIVE: D-Dimer, Quant: <0.27 ug/mL-FEU (ref 0.00–0.48)

## 2013-05-08 NOTE — Progress Notes (Signed)
[facility] OFFICE PROGRESS NOTE  PCP: Glo Herring., MD [id number] [place] [address]  DIAGNOSIS: No diagnosis found. ITP, HX PE UNPROVOKED  CURRENT THERAPY: PO PREDNISONE, PRESCRIBED 10MG  DAILY, PATIENT IN LAST WEEK HAS INCREASED ON HER OWN TO 40 MG DAILY  INTERVAL HISTORY: Summer Cook 47 y.o. female returns for    MEDICAL HISTORY: Past Medical History  Diagnosis Date  . Essential hypertension, benign   . 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  Last was Prednisone 40mg ; 2 wk in 10/2012.  She also was on Prednisone bridged with Cellcept briefly but stopped due to lack of insurance.   . Depression   . Thrush, oral 05/29/2011  . Steroid-induced diabetes   . Pulmonary embolism 04/2012  . Asthma   . Renal insufficiency     SURGICAL HISTORY:  Past Surgical History  Procedure Laterality Date  . Cholecystectomy  2008  . Bone marrow biopsy    . 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 Colonoscopy with esophagogastroduodenoscopy (egd) N/A 01/04/2013    Dr. Gala Romney: esophageal plaques with +KOH, hh. Gastric antrum abnormal, bx reactive gastropathy. Anal canal hemorrhoids, colonic tics, normal TI, single polyp (sessile serrated tubular adenoma). Next TCS 12/2017      PROBLEM LIST : has Hypertension; ITP (idiopathic thrombocytopenic purpura); Tachycardia; Hypokalemia; Hemorrhoids; Drug-induced hyperglycemia; Post-splenectomy; Immunosuppressed status; Depression; Obesity; Headache; Renal insufficiency; PE (pulmonary embolism); Rectal bleeding; Abdominal pain, epigastric; Melena; Chronic anticoagulation; Unspecified constipation; Nausea with vomiting; Diarrhea; Abnormal LFTs; Hyponatremia; and Orthostatic hypotension on her problem list.  Smoker quit 2 yrs ago, no alcohol  ALLERGIES:  is allergic to yellow jacket venom.  MEDICATIONS: Current  outpatient prescriptions:albuterol (PROVENTIL HFA;VENTOLIN HFA) 108 (90 BASE) MCG/ACT inhaler, Inhale 2 puffs into the lungs every 6 (six) hours as needed for wheezing or shortness of breath. , Disp: , Rfl: ;  ALPRAZolam (XANAX) 1 MG tablet, Take 1 mg by mouth 3 (three) times daily as needed for anxiety. , Disp: , Rfl: ;  Cholecalciferol (VITAMIN D) 2000 UNITS CAPS, Take 1 capsule by mouth daily., Disp: , Rfl:  escitalopram (LEXAPRO) 20 MG tablet, Take 20 mg by mouth daily., Disp: , Rfl: ;  HYDROcodone-acetaminophen (NORCO) 10-325 MG per tablet, Take 1 tablet by mouth every 6 (six) hours as needed for pain. , Disp: , Rfl: ;  meclizine (ANTIVERT) 25 MG tablet, Take 1 tablet (25 mg total) by mouth every 6 (six) hours as needed for dizziness., Disp: 20 tablet, Rfl: 0 metoprolol succinate (TOPROL-XL) 50 MG 24 hr tablet, Take 50 mg by mouth at bedtime. Take with or immediately following a meal., Disp: , Rfl: ;  ondansetron (ZOFRAN) 8 MG tablet, Take 1 tablet (8 mg total) by mouth every 8 (eight) hours as needed for nausea., Disp: 20 tablet, Rfl: 0;  oxyCODONE-acetaminophen (PERCOCET/ROXICET) 5-325 MG per tablet, Take 1 tablet by mouth every 6 (six) hours as needed for pain., Disp: , Rfl:  predniSONE (DELTASONE) 10 MG tablet, Take 1 tablet (10 mg total) by mouth daily., Disp: 90 tablet, Rfl: 4;  triamterene-hydrochlorothiazide (DYAZIDE) 37.5-25 MG per capsule, Take 1 capsule by mouth every morning. , Disp: , Rfl: ;  warfarin (COUMADIN) 1 MG tablet, Take 3 mg by mouth every evening. , Disp: , Rfl: ;  [DISCONTINUED] prochlorperazine (COMPAZINE) 10 MG tablet, Take 10 mg by mouth as needed. For nausea , Disp: , Rfl:  REVIEW OF SYSTEMS:  No headache dizziness, recent neurologic problems have spontaneously resolved, no chest or nominal pain, gets episodic short of breath of asthma but not short of breath now, no nausea vomiting diarrhea. Recently pealed some loose skin over 2 "bleeding that has resolved no other  bleeding or bruising. Reports increased symmetric lower extremity edema in the last week since going on the higher dose of prednisone  PHYSICAL EXAMINATION: ECOG PERFORMANCE STATUS: 0 - Asymptomatic    159/101 71 97.8  GENERAL: No distress, well nourished.  SKIN:  No rashes or bruising  HEAD: Normocephalic, No trauma EYES: Sclera and conjuntiva clear  ENT: No thrush LYMPH: No palpable lymphadenopathy neck, supraclavicular submandibular LUNGS: Clear to auscultation, no crackles or wheezes or rhonchi HEART: Regular rate & rhythm,   ABDOMEN: Abdomen soft, non-tender,  obese, no masses or organomegaly   EXTREMITIES: 2 plus symmetric lower ext edema, pitting, non tender, no pigment changes ext edema NEURO: Alert & oriented, no gross focal weakness. PSYCH  Cooperative, mood/affect normal   LABORATORY DATA:   plts ok 227 k    RADIOGRAPHIC STUDIES: No results found.   ASSESSMENT:  With regard to ITP, as reviewed in prior providers note, previous multiple therapies. Prednisone is effective but patient high dose clearly has edema and also blood pressure is elevated and is at risk for long-term effects of steroids. Therefore optimally we should try and reduce the prednisone down to 10 mg a day and if that can maintain an adequate platelet count then we will follow on low-dose prednisone. Otherwise Nplate has been discussed , and  be appropriate to give if platelet count cannot be maintained. Today blood pressure slightly up but asymptomatic and this might be attributed to the prednisone, will watch blood pressure when  followup labs, also patient will be going back to PMD she is overdue. There is also the issue of Coumadin maintenance. There is a history of pulmonary embolism but there were no automatic criteria for maintaining indefinite anticoagulation prior hypercoagulable studies have been done those results reviewed 1 negative, with no previous clot history. Therefore I am completing the  workup with prothrombin mutation and checking a d-dimer and a lupus anticoagulant. If these are negative, and if there is is no persistent edema, then we'll recommend discontinue Coumadin. This appears to have been primary physician's recommendation as well but patient is overdue for followup finally, patient is overdue for checking her pro time so we will go ahead and check that today   PLAN:  CBC was checked. Will check the pro time take appropriate action if abnormal. Given instructions in taper down the prednisone to 30 then 20 then 10 daily. Then weekly platelet count along with clinical followup of blood pressure. Check a d-dimer and a prothrombin mutation and lupus anticoagulant today. Look at stopping long-term Coumadin M.D. followup next week to review these issues   All questions were answered. The patient knows to call the clinic with any problems, questions or concerns. We can certainly see the patient much sooner if necessary.     Dallas Schimke, MD 05/08/2013 8:25 AM

## 2013-05-08 NOTE — Patient Instructions (Signed)
[facility] Discharge Instructions  RECOMMENDATIONS MADE BY THE CONSULTANT AND ANY TEST RESULTS WILL BE SENT TO YOUR REFERRING PHYSICIAN.  EXAM FINDINGS BY THE PHYSICIAN TODAY AND SIGNS OR SYMPTOMS TO REPORT TO CLINIC OR PRIMARY PHYSICIAN: Exam findings as discussed by Dr. Inez Pilgrim.  SPECIAL INSTRUCTIONS/FOLLOW-UP: 1.  Decrease your Prednisone to 30mg  today and for 3 days, then decrease to 20mg  for 3 days, and then begin 10mg  daily until you are seen in the office next Wednesday. 2.  You had labs done today, and we need to repeat your CBC again next Wednesday when you see the physician.   Thank you for choosing [facility] to provide your oncology and hematology care.  To afford each patient quality time with our providers, please arrive at least 15 minutes before your scheduled appointment time.  With your help, our goal is to use those 15 minutes to complete the necessary work-up to ensure our physicians have the information they need to help with your evaluation and healthcare recommendations.    Effective January 1st, 2014, we ask that you re-schedule your appointment with our physicians should you arrive 10 or more minutes late for your appointment.  We strive to give you quality time with our providers, and arriving late affects you and other patients whose appointments are after yours.    Again, thank you for choosing [facility]  Our hope is that these requests will decrease the amount of time that you wait before being seen by our physicians.       _____________________________________________________________  Should you have questions after your visit to [facility], please contact our office at [phone] between the hours of 8:30 a.m. and 5:00 p.m.  Voicemails left after 4:30 p.m. will not be returned until the following business day.  For prescription refill requests, have your pharmacy contact our office with your  prescription refill request.

## 2013-05-10 NOTE — Telephone Encounter (Signed)
Please try to make contact with the patient again.

## 2013-05-11 NOTE — Telephone Encounter (Signed)
Pt is aware of recommendations

## 2013-05-17 ENCOUNTER — Encounter (HOSPITAL_BASED_OUTPATIENT_CLINIC_OR_DEPARTMENT_OTHER): Payer: BC Managed Care – PPO

## 2013-05-17 VITALS — BP 100/64 | HR 118 | Temp 98.6°F | Resp 18 | Wt 216.9 lb

## 2013-05-17 DIAGNOSIS — E860 Dehydration: Secondary | ICD-10-CM

## 2013-05-17 DIAGNOSIS — D693 Immune thrombocytopenic purpura: Secondary | ICD-10-CM

## 2013-05-17 DIAGNOSIS — Z86711 Personal history of pulmonary embolism: Secondary | ICD-10-CM

## 2013-05-17 DIAGNOSIS — R5383 Other fatigue: Secondary | ICD-10-CM

## 2013-05-17 DIAGNOSIS — R5381 Other malaise: Secondary | ICD-10-CM

## 2013-05-17 LAB — CBC
HCT: 46 % (ref 36.0–46.0)
Hemoglobin: 14 g/dL (ref 12.0–15.0)
MCH: 24.1 pg — ABNORMAL LOW (ref 26.0–34.0)
MCV: 79 fL (ref 78.0–100.0)
Platelets: 104 10*3/uL — ABNORMAL LOW (ref 150–400)
RBC: 5.82 MIL/uL — ABNORMAL HIGH (ref 3.87–5.11)
WBC: 12.9 10*3/uL — ABNORMAL HIGH (ref 4.0–10.5)

## 2013-05-17 MED ORDER — PREDNISONE 10 MG PO TABS: 10.0000 mg | ORAL_TABLET | Freq: Every day | ORAL | Status: DC

## 2013-05-17 MED ORDER — SODIUM CHLORIDE 0.9 % IV SOLN
INTRAVENOUS | Status: AC
  Administered 2013-05-17: 14:00:00 via INTRAVENOUS

## 2013-05-17 MED ORDER — SODIUM CHLORIDE 0.9 % IJ SOLN
10.0000 mL | INTRAMUSCULAR | Status: DC | PRN
  Administered 2013-05-17: 10 mL via INTRAVENOUS
  Filled 2013-05-17: qty 10

## 2013-05-17 NOTE — Patient Instructions (Addendum)
[facility] Discharge Instructions  RECOMMENDATIONS MADE BY THE CONSULTANT AND ANY TEST RESULTS WILL BE SENT TO YOUR REFERRING PHYSICIAN.  EXAM FINDINGS BY THE PHYSICIAN TODAY AND SIGNS OR SYMPTOMS TO REPORT TO CLINIC OR PRIMARY PHYSICIAN: Exam and discussion by Dr. Alinda Deem. You need to talk with the MD that is monitoring your INR about stopping your coumadin.  MEDICATIONS PRESCRIBED:  Prednisone take 20 mg alternating with 10 mg for 1 week then take 10 mg daily until we see you back in 1 month.  INSTRUCTIONS GIVEN AND DISCUSSED: Increase your fluid intake  SPECIAL INSTRUCTIONS/FOLLOW-UP: Follow-up in 1 month with blood work and MD visit.  Thank you for choosing [facility] to provide your oncology and hematology care.  To afford each patient quality time with our providers, please arrive at least 15 minutes before your scheduled appointment time.  With your help, our goal is to use those 15 minutes to complete the necessary work-up to ensure our physicians have the information they need to help with your evaluation and healthcare recommendations.    Effective January 1st, 2014, we ask that you re-schedule your appointment with our physicians should you arrive 10 or more minutes late for your appointment.  We strive to give you quality time with our providers, and arriving late affects you and other patients whose appointments are after yours.    Again, thank you for choosing [facility].  Our hope is that these requests will decrease the amount of time that you wait before being seen by our physicians.       _____________________________________________________________  Should you have questions after your visit to [facility], please contact our office at [phone] between the hours of 8:30 a.m. and 5:00 p.m.  Voicemails left after 4:30 p.m. will not be returned until the following business day.  For prescription refill  requests, have your pharmacy contact our office with your prescription refill request.

## 2013-05-17 NOTE — Progress Notes (Signed)
[facility] [phone]   [phone]  OFFICE PROGRESS NOTE  Glo Herring., MD [id number] [place] [address]  DIAGNOSIS: ITP  INTERVAL HISTORY:   Summer Cook 47 y.o. female returns to the clinic today for follow up of her ITP.  She is accompanied by her daughter and mother.  She tells me that she had  decreased steroid to 10 mg daily 3  days ago as instructed, subsequently she developed fatigue and unable to accomplish  her ADLs successfully without difficulty.  She states that she fainted 2 days ago.   She reports that she is very sleepy and  Dizzy when she stands up.  We did  Orthostatic BP and heart rate which showed the blood pressure lying down was 121/90 , HR 121 and standing of 100/64 with heart rate of 67.  Sitting down was 100/64 and HR of 67 . She reports to reduce oral intake. She denies any easy bruising or bleeding. Does not report any fever or  Chills, denies any shortness of breath or chest pain. No change in bowel habit reported.   MEDICAL HISTORY: Past Medical History  Diagnosis Date  . Essential hypertension, benign   . 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  Last was Prednisone 40mg ; 2 wk in 10/2012.  She also was on Prednisone bridged with Cellcept briefly but stopped due to lack of insurance.   . Depression   . Thrush, oral 05/29/2011  . Steroid-induced diabetes   . Pulmonary embolism 04/2012  . Asthma   . Renal insufficiency     ALLERGIES:  is allergic to yellow jacket venom.  MEDICATIONS:  Current Outpatient Prescriptions  Medication Sig Dispense Refill  . albuterol (PROVENTIL HFA;VENTOLIN HFA) 108 (90 BASE) MCG/ACT inhaler Inhale 2 puffs into the lungs every 6 (six) hours as needed for wheezing or shortness of breath.       . ALPRAZolam (XANAX) 1 MG tablet Take 1 mg by mouth 3  (three) times daily as needed for anxiety.       . Cholecalciferol (VITAMIN D) 2000 UNITS CAPS Take 1 capsule by mouth daily.      Marland Kitchen HYDROcodone-acetaminophen (NORCO) 10-325 MG per tablet Take 1 tablet by mouth every 6 (six) hours as needed for pain.       Marland Kitchen ipratropium-albuterol (DUONEB) 0.5-2.5 (3) MG/3ML SOLN 3 mLs as needed.       . meclizine (ANTIVERT) 25 MG tablet Take 1 tablet (25 mg total) by mouth every 6 (six) hours as needed for dizziness.  20 tablet  0  . metoprolol succinate (TOPROL-XL) 50 MG 24 hr tablet Take 50 mg by mouth at bedtime. Take with or immediately following a meal.      . predniSONE (DELTASONE) 10 MG tablet Take 10 mg by mouth daily.       . sertraline (ZOLOFT) 50 MG tablet Take 50 mg by mouth daily.      Marland Kitchen triamterene-hydrochlorothiazide (DYAZIDE) 37.5-25 MG per capsule Take 1 capsule by mouth every morning.       . ondansetron (ZOFRAN) 8 MG tablet Take 1 tablet (8 mg total) by mouth every 8 (eight) hours as needed for nausea.  20 tablet  0  . oxyCODONE-acetaminophen (PERCOCET/ROXICET) 5-325 MG per tablet Take 1 tablet by mouth every 6 (six) hours as needed for pain.      Marland Kitchen  warfarin (COUMADIN) 1 MG tablet Take 3 mg by mouth every evening.       . [DISCONTINUED] prochlorperazine (COMPAZINE) 10 MG tablet Take 10 mg by mouth as needed. For nausea        Current Facility-Administered Medications  Medication Dose Route Frequency Provider Last Rate Last Dose  . 0.9 %  sodium chloride infusion   Intravenous Continuous Verlan Friends, MD        SURGICAL HISTORY:  Past Surgical History  Procedure Laterality Date  . Cholecystectomy  2008  . Bone marrow biopsy    . 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 Colonoscopy with esophagogastroduodenoscopy (egd) N/A 01/04/2013    Dr. Gala Romney: esophageal plaques with +KOH, hh. Gastric antrum abnormal, bx reactive gastropathy. Anal canal hemorrhoids, colonic tics, normal TI, single polyp (sessile serrated tubular adenoma). Next TCS 12/2017      REVIEW OF SYSTEMS: As above otherwise negative.  PHYSICAL EXAMINATION:  Blood pressure 100/64, pulse 118, temperature 98.6 F (37 C), temperature source Oral, resp. rate 18, weight 216 lb 14.4 oz (98.385 kg), last menstrual period 05/13/2011. GENERAL: No acute distress.obese. Has steroid facial changes. SKIN:  No rashes or significant lesions . No ecchymosis or petechial rash. Striae. HEAD: Normocephalic, No masses, lesions, tenderness or abnormalities  EYES: Conjunctiva are pink and non-injected and no jaundice ENT: External ears normal ,lips, buccal mucosa, and tongue normal and mucous membranes are moist . No evidence of thrush. LUNGS: Clear to auscultation , no crackles or wheezes HEART: regular rate & rhythm, no murmurs, no gallops, S1 normal and S2 normal and no S3. ABDOMEN: Abdomen soft, non-tender, no masses or organomegaly and no hepatosplenomegaly palpable EXTREMITIES: No edema, no skin discoloration or tenderness      LABORATORY DATA: Lab Results  Component Value Date   WBC 12.9* 05/17/2013   HGB 14.0 05/17/2013   HCT 46.0 05/17/2013   MCV 79.0 05/17/2013   PLT 104* 05/17/2013      Chemistry      Component Value Date/Time   NA 141 04/05/2013 1118   NA 140 02/02/2013 0840   K 3.9 04/05/2013 1118   K 3.9 02/02/2013 0840   CL 100 04/05/2013 1118   CL 97* 02/02/2013 0840   CO2 30 04/05/2013 1118   CO2 30* 02/02/2013 0840   BUN 17 04/05/2013 1118   BUN 25.7 02/02/2013 0840   CREATININE 1.21* 04/05/2013 1118   CREATININE 1.4* 02/02/2013 0840      Component Value Date/Time   CALCIUM 9.4 04/05/2013 1118   CALCIUM 8.7 02/02/2013 0840   ALKPHOS 39 04/05/2013 1118   ALKPHOS 60 02/02/2013 0840   AST 26 04/05/2013 1118   AST 23 02/02/2013 0840   ALT 43* 04/05/2013 1118   ALT 67* 02/02/2013 0840   BILITOT 0.4 04/05/2013 1118   BILITOT 0.68 02/02/2013 0840       RADIOGRAPHIC STUDIES: No results found.   ASSESSMENT:  ITP.  Patient is maintaining platelet count with steroids dose reduction.  Her  fatigue most likely from her steroid dose reduction considering that she has been on a protracted course of high-dose steroids. She may have relative adrenal insufficieny due to steroid weaning attempt after a protracted course of steroid.  Tapering steroid dose more slowly would be very reasonable. She may be orthostatic and had dry mucus  membranes suggestive of some degree of dehydration. I had a very long discussion with the patient and her relatives.   PLAN:  1. Will administer 1 L of  IV fluids normal saline over 2 hours. 2. I asked patient to go back to 20 mg of prednisone alternating with 10 mg every day for one week, then continued 10 mg daily until her next visit in 4 weeks. 3. I told her that the goal will be to maintain a platelet count  of  about 3,000 provided she is no bleeding with the lowest possible dose of steroid. Generally patient do not  Bleed  with a platelet count of above 20,000. 4. Return to clinic in 4 weeks. 5.Patient encouraged to drink plenty of fluid.    All questions were satisfactorily answered. Patient knows to call if  any concern arises.  I spent more than 50 % counseling the patient face to face. The total time spent in the appointment was 40 minutes.   Verlan Friends, MD FACP. Hematology/Oncology.

## 2013-05-17 NOTE — Progress Notes (Signed)
Labs drawn today for cbc 

## 2013-05-22 ENCOUNTER — Other Ambulatory Visit (HOSPITAL_COMMUNITY): Payer: Self-pay | Admitting: Internal Medicine

## 2013-05-22 DIAGNOSIS — R0609 Other forms of dyspnea: Secondary | ICD-10-CM

## 2013-05-23 ENCOUNTER — Ambulatory Visit (HOSPITAL_COMMUNITY)
Admission: RE | Admit: 2013-05-23 | Discharge: 2013-05-23 | Disposition: A | Discharge status: Home / Self Care | Payer: BC Managed Care – PPO | Source: Ambulatory Visit | Attending: Internal Medicine | Admitting: Internal Medicine

## 2013-05-23 ENCOUNTER — Other Ambulatory Visit (HOSPITAL_COMMUNITY): Payer: Self-pay | Admitting: Internal Medicine

## 2013-05-23 ENCOUNTER — Ambulatory Visit (HOSPITAL_COMMUNITY): Payer: BC Managed Care – PPO

## 2013-05-23 DIAGNOSIS — R0609 Other forms of dyspnea: Secondary | ICD-10-CM

## 2013-05-23 DIAGNOSIS — R0602 Shortness of breath: Secondary | ICD-10-CM | POA: Insufficient documentation

## 2013-05-24 ENCOUNTER — Other Ambulatory Visit: Payer: Self-pay | Admitting: Obstetrics and Gynecology

## 2013-05-25 IMAGING — CT CT HEAD W/O CM
1 series · 16 of 30 positions shown, 20 images · non-contrast
Comparison: 12/19/2010

CLINICAL DATA: Severe headache for 2 weeks.  No injury. History of
hypertension.  History of ITP.

CT HEAD WITHOUT CONTRAST
TECHNIQUE: Contiguous axial images were obtained from the base of
the skull through the vertex without contrast

[Series 2: headseq 4.8 h37s · axial · 0.45mm/px · z∈[+64,+195]mm · 16 of 30 slices shown, 20 images]
[im 2/30  brain]
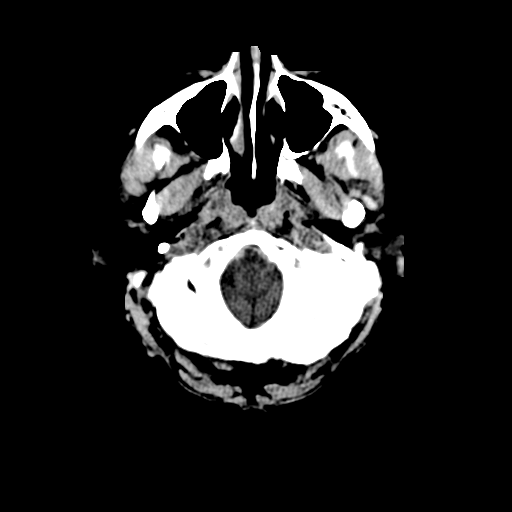
[im 2/30  bone]
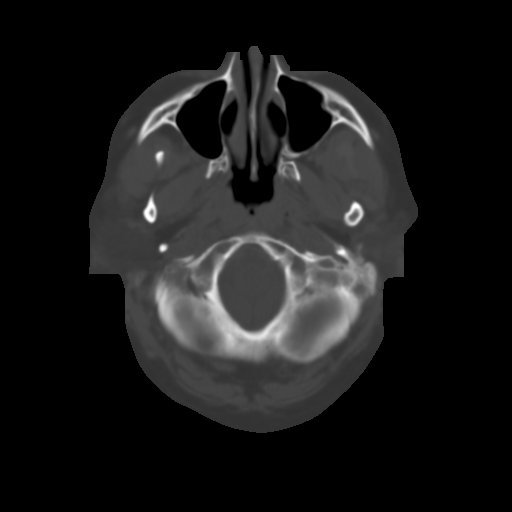
[im 4/30  brain]
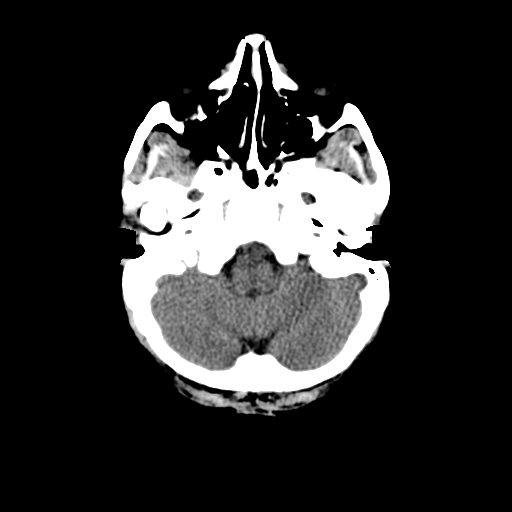
[im 6/30  brain]
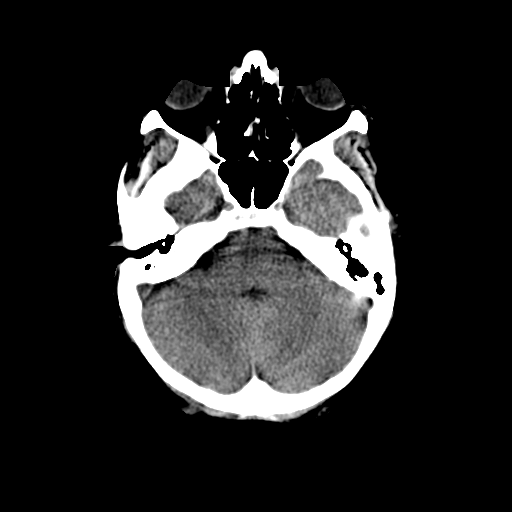
[im 8/30  brain]
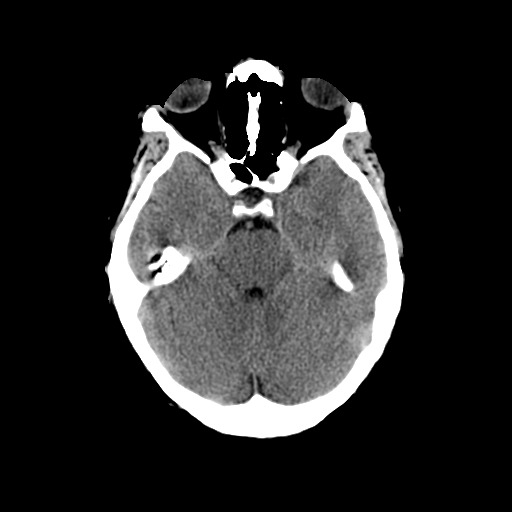
[im 9/30  brain]
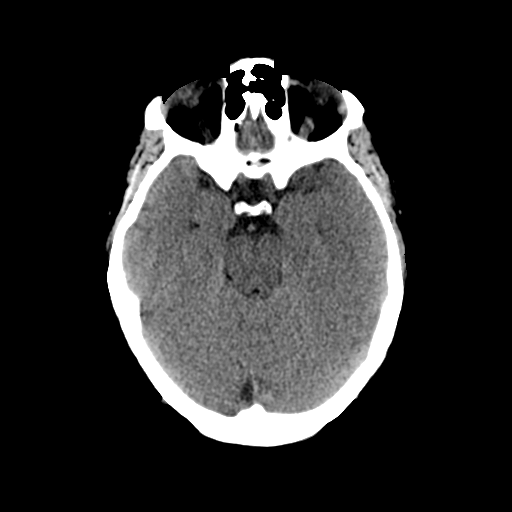
[im 9/30  bone]
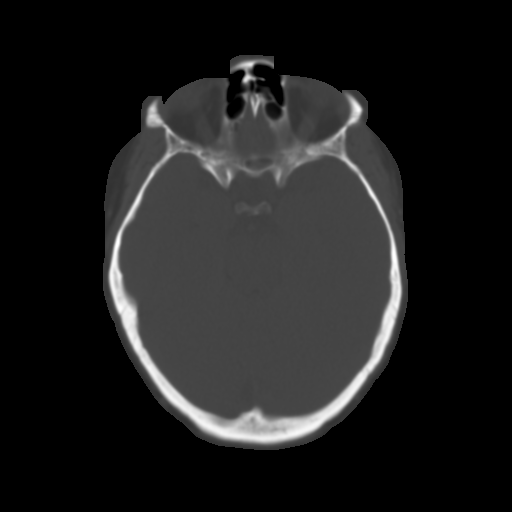
[im 11/30  brain]
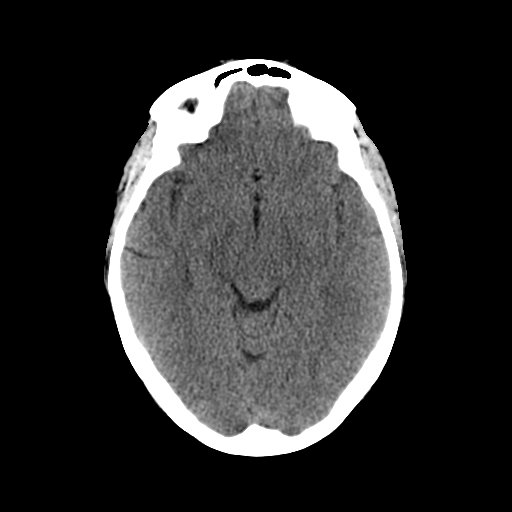
[im 13/30  brain]
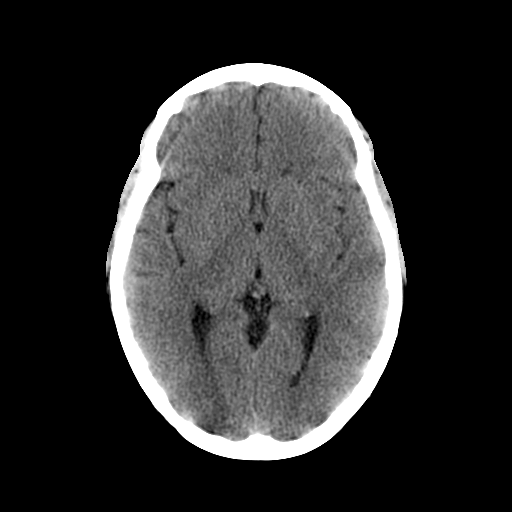
[im 15/30  brain]
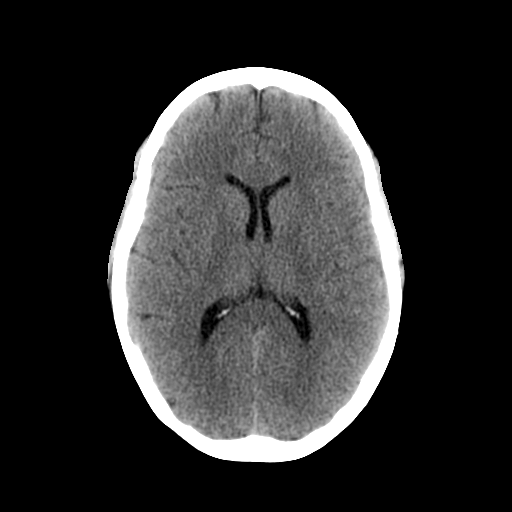
[im 16/30  brain]
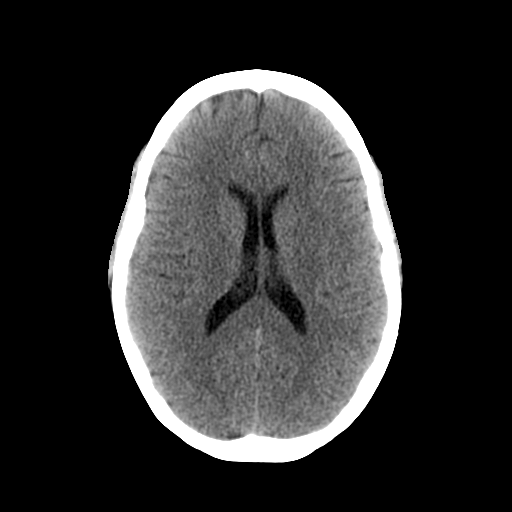
[im 16/30  bone]
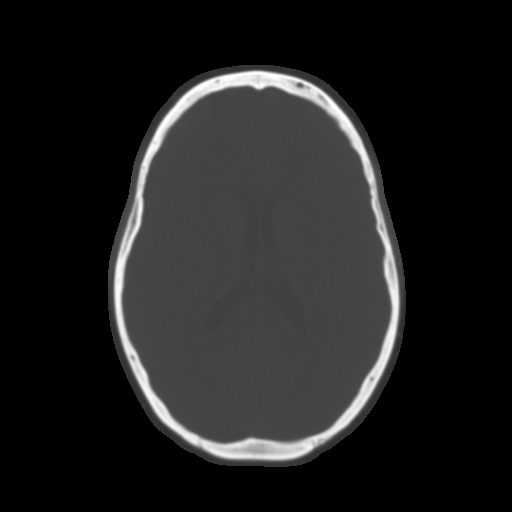
[im 18/30  brain]
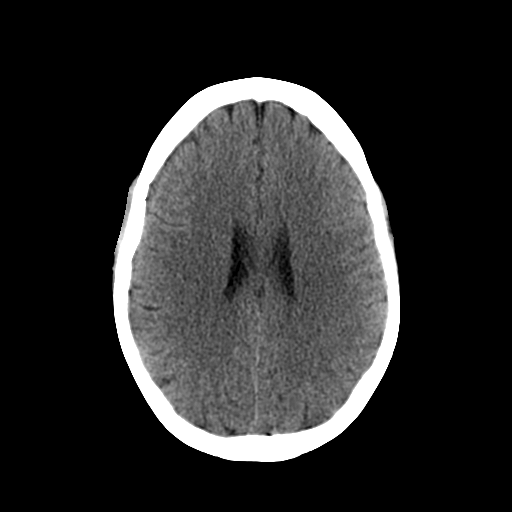
[im 20/30  brain]
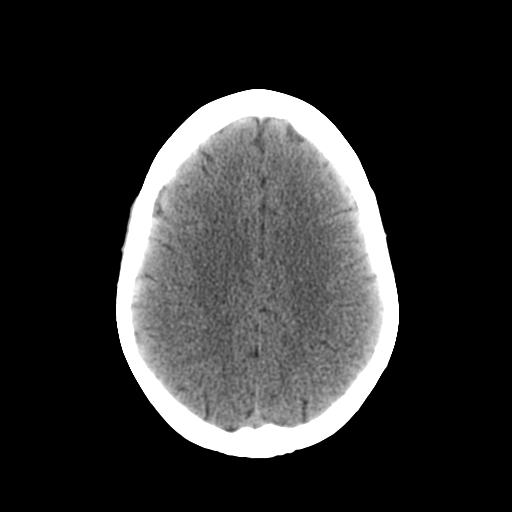
[im 22/30  brain]
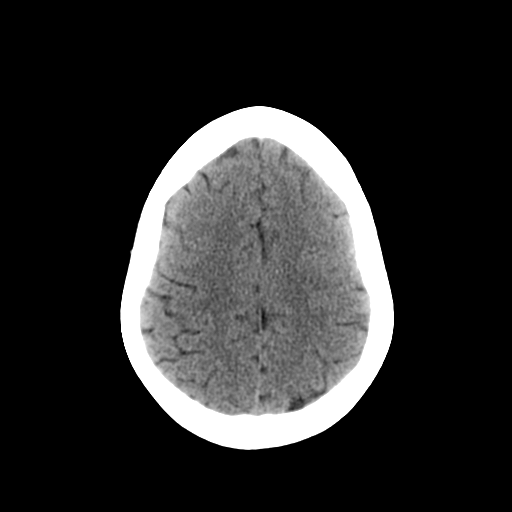
[im 23/30  brain]
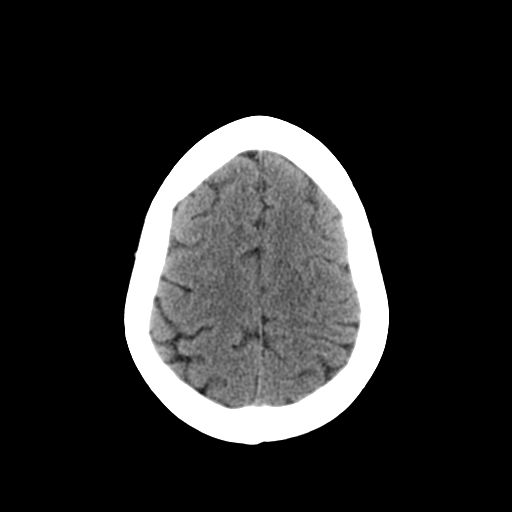
[im 23/30  bone]
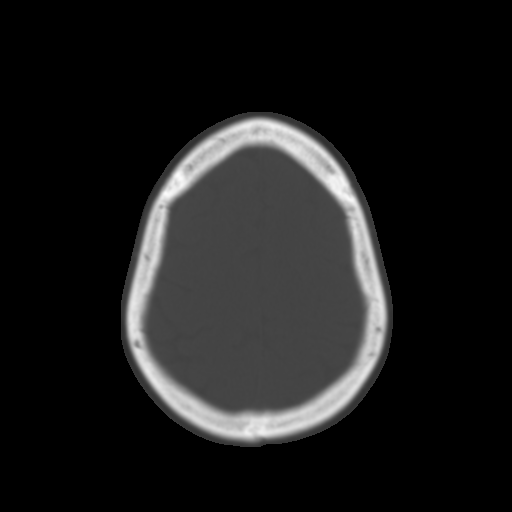
[im 25/30  brain]
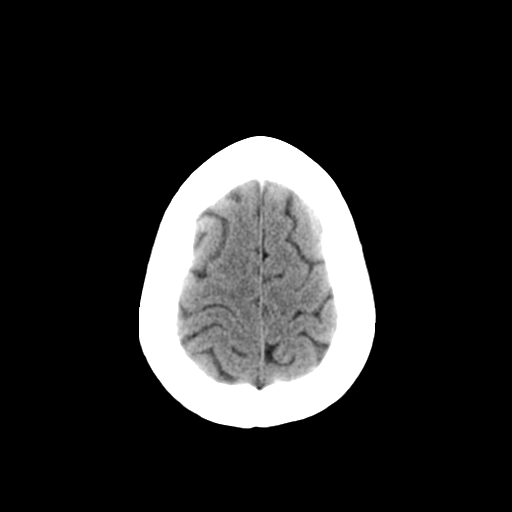
[im 27/30  brain]
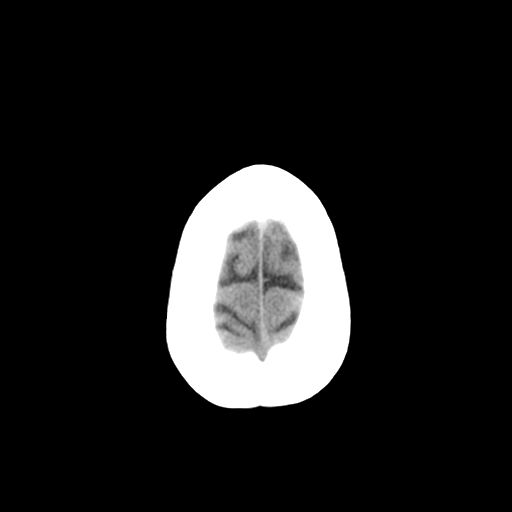
[im 29/30  brain]
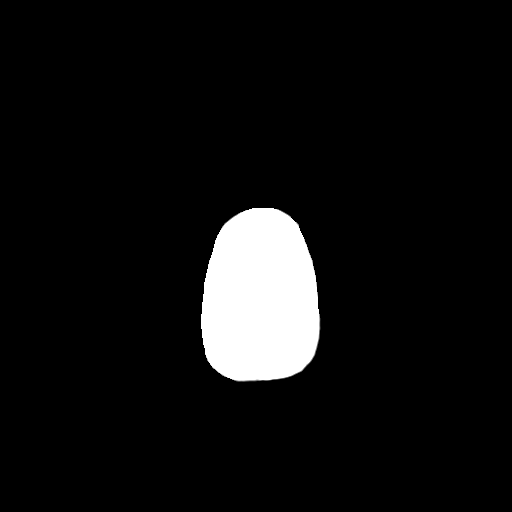

[16 of 30 positions shown; findings below may reference images not displayed]

FINDINGS: The brain has a normal appearance without evidence for
hemorrhage, acute infarction, hydrocephalus, or mass lesion.  There
is no extra axial fluid collection.  The skull and paranasal
sinuses are normal. No change from priors.
IMPRESSION: Normal CT of the head without contrast.

## 2013-05-26 IMAGING — US US ABDOMEN COMPLETE
1 series · 14 of 25 positions shown · non-contrast
Comparison: CT of 10/08/2010.

CLINICAL DATA: Elevated liver function tests.  Renal
insufficiency.  Prior cholecystectomy and splenectomy.

COMPLETE ABDOMINAL ULTRASOUND

[Series 1: us abdomen complete · 0.32mm/px · 14 of 64 slices shown]
[im 1/64]
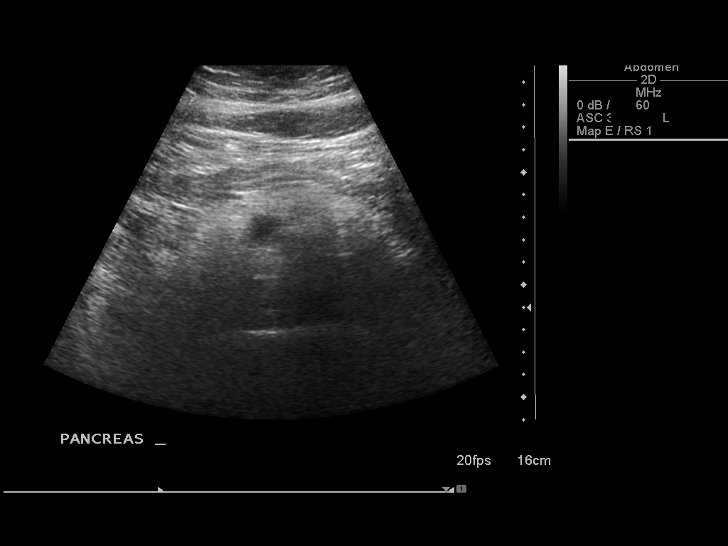
[im 6/64]
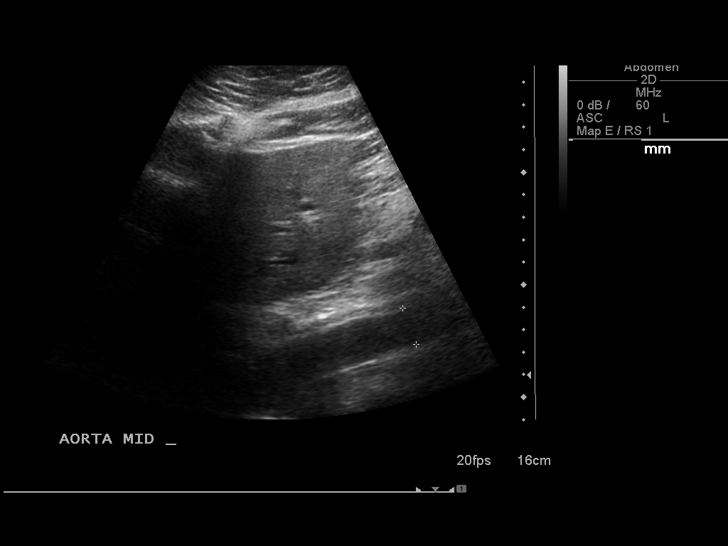
[im 11/64]
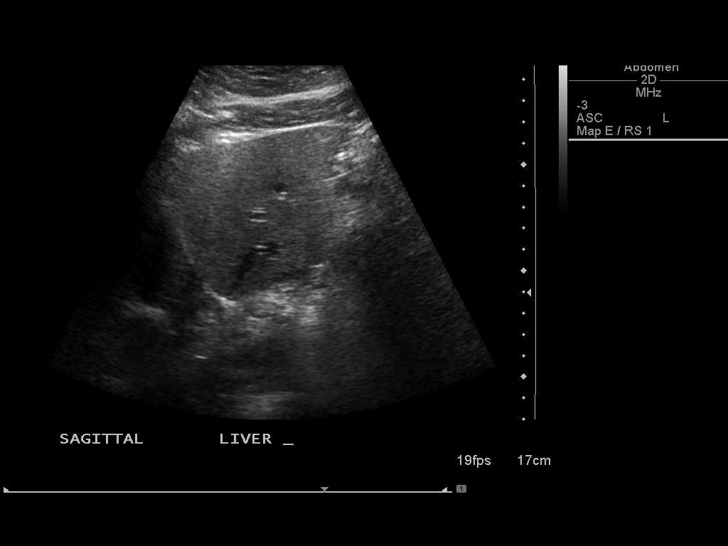
[im 16/64]
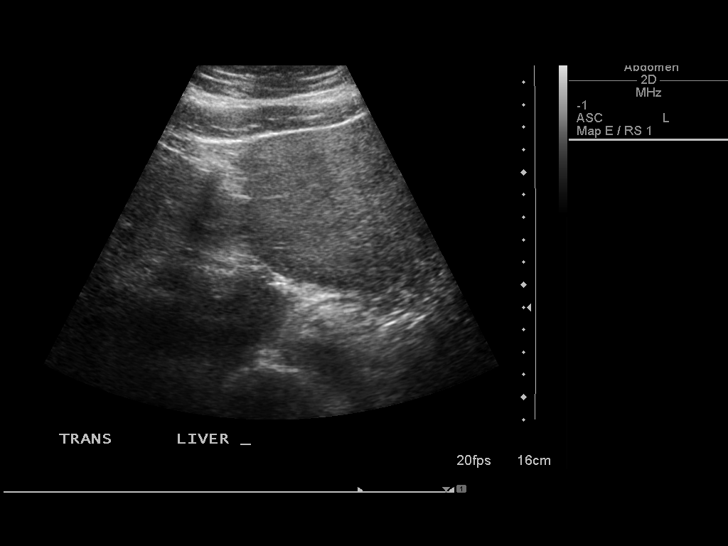
[im 22/64]
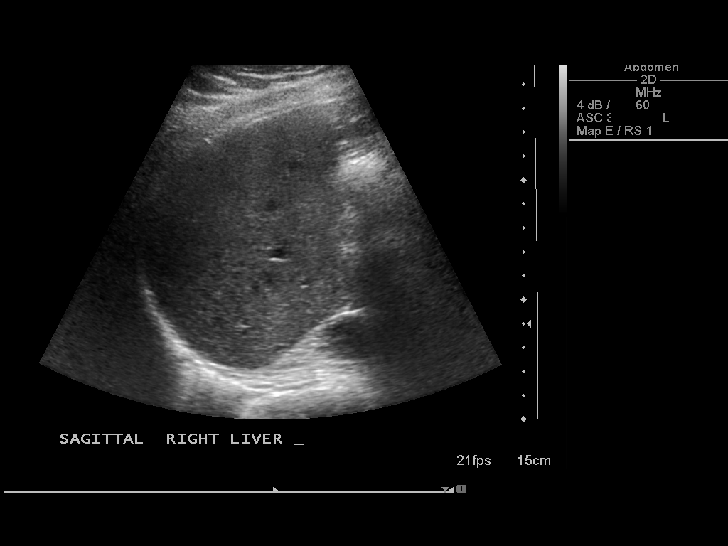
[im 24/64]
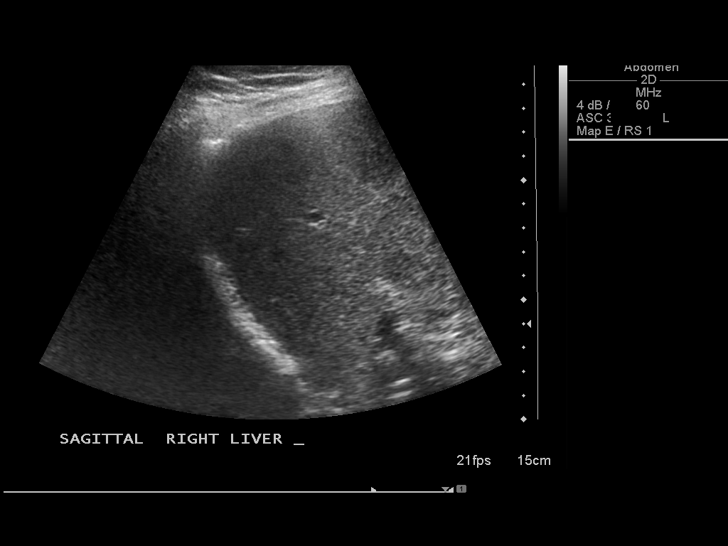
[im 29/64]
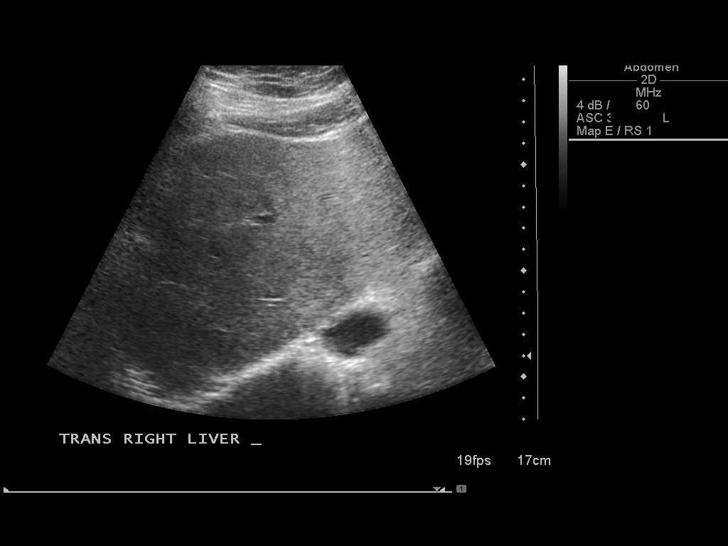
[im 35/64]
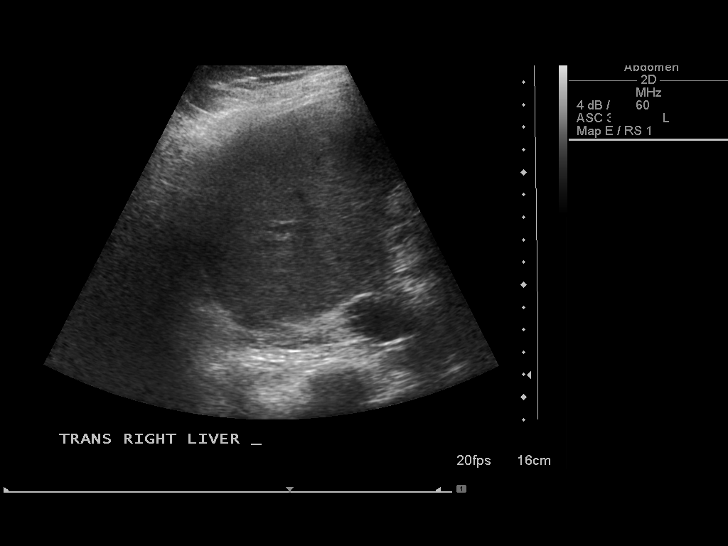
[im 40/64]
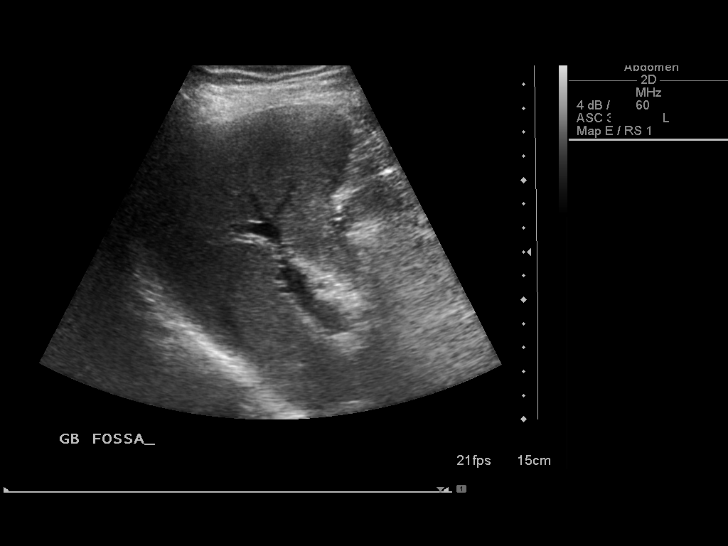
[im 43/64]
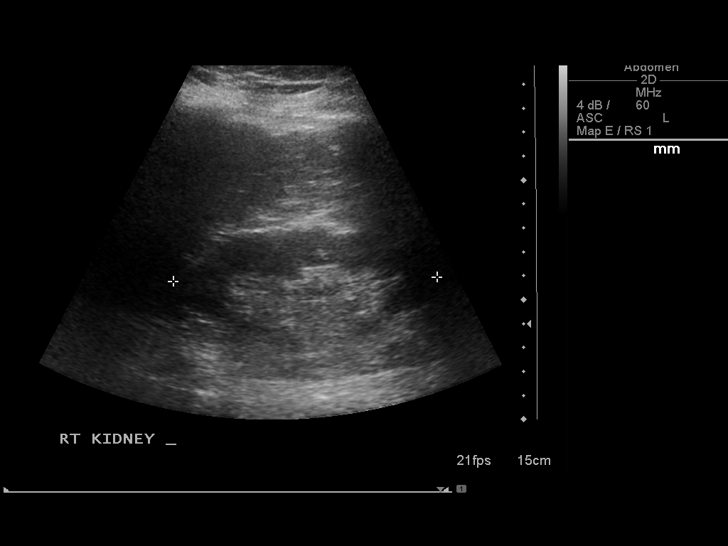
[im 48/64]
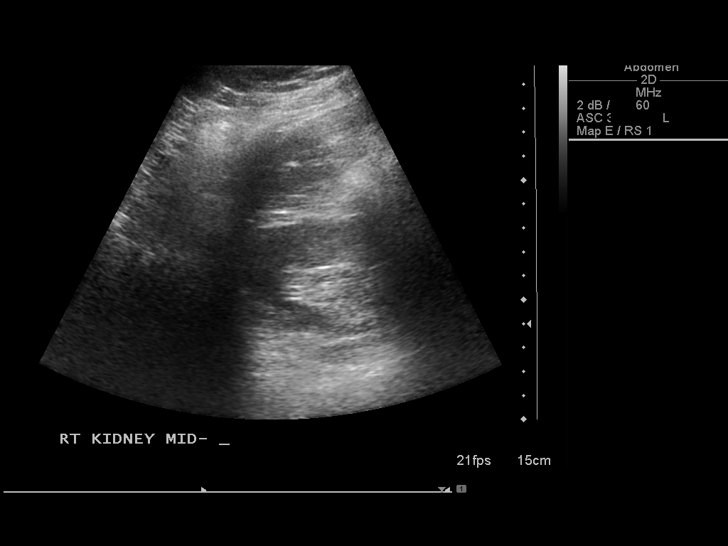
[im 53/64]
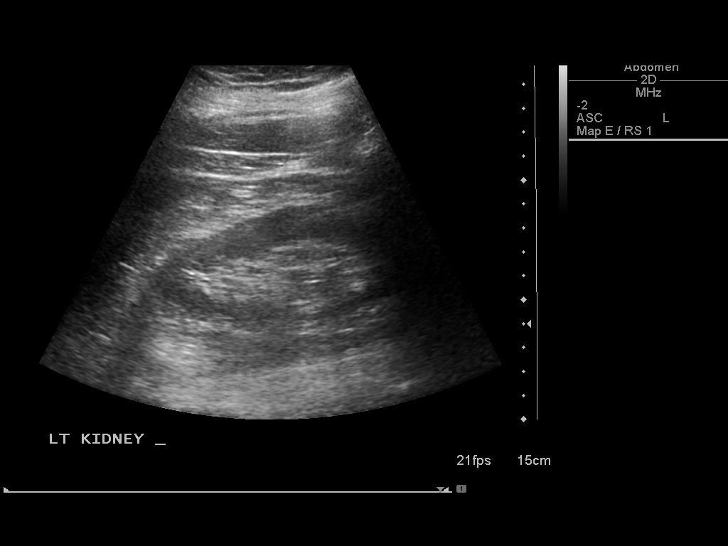
[im 58/64]
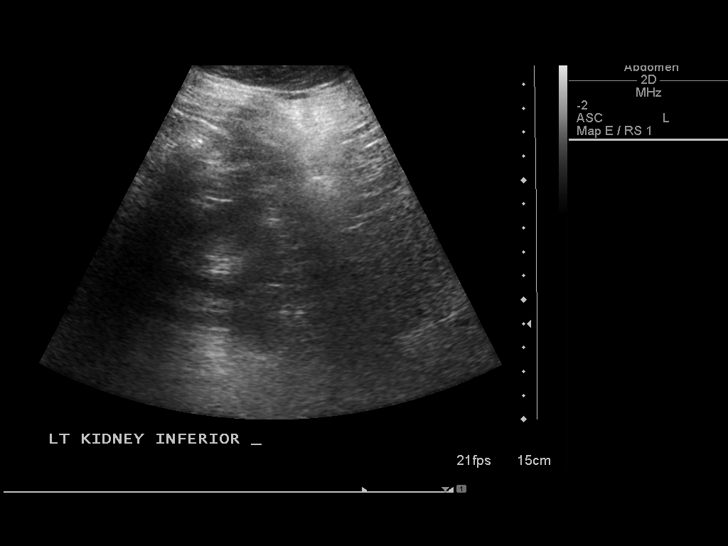
[im 64/64]
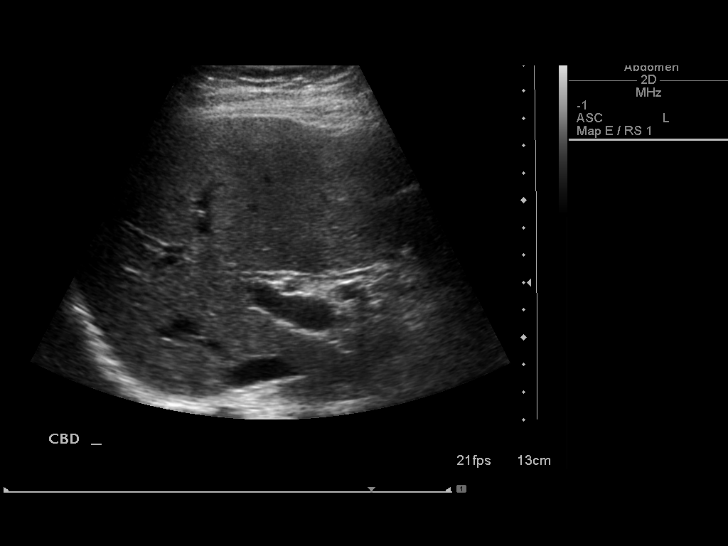

[14 of 25 positions shown; findings below may reference images not displayed]

FINDINGS: Gallbladder:  Surgically absent

Common bile duct: Normal, 3 mm.

Liver: Normal in echogenicity, without focal lesion.

IVC: Negative

Pancreas:  Negative

Spleen:  Surgically absent

Right Kidney:  11.0 cm. No hydronephrosis.

Left Kidney:  11.0 cm.  Apparent renal pelvic fullness could be
secondary to an extrarenal pelvis.  Image 57.

Abdominal aorta:  Nonaneurysmal without ascites.
IMPRESSION: No acute process or explanation for pain.

Favor left extrarenal pelvis.  Minimal pelviectasis could look
similar.

## 2013-06-07 ENCOUNTER — Ambulatory Visit (HOSPITAL_COMMUNITY)
Admission: RE | Admit: 2013-06-07 | Discharge: 2013-06-07 | Disposition: A | Discharge status: Home / Self Care | Payer: BC Managed Care – PPO | Source: Ambulatory Visit | Attending: Internal Medicine | Admitting: Internal Medicine

## 2013-06-07 DIAGNOSIS — M545 Low back pain, unspecified: Secondary | ICD-10-CM | POA: Insufficient documentation

## 2013-06-07 DIAGNOSIS — M6281 Muscle weakness (generalized): Secondary | ICD-10-CM | POA: Insufficient documentation

## 2013-06-07 DIAGNOSIS — IMO0001 Reserved for inherently not codable concepts without codable children: Secondary | ICD-10-CM | POA: Insufficient documentation

## 2013-06-08 ENCOUNTER — Ambulatory Visit (HOSPITAL_COMMUNITY)
Admission: RE | Admit: 2013-06-08 | Discharge: 2013-06-08 | Disposition: A | Discharge status: Home / Self Care | Payer: BC Managed Care – PPO | Source: Ambulatory Visit | Attending: Internal Medicine | Admitting: Internal Medicine

## 2013-06-08 ENCOUNTER — Ambulatory Visit (HOSPITAL_COMMUNITY): Payer: BC Managed Care – PPO

## 2013-06-08 DIAGNOSIS — Z1231 Encounter for screening mammogram for malignant neoplasm of breast: Secondary | ICD-10-CM | POA: Insufficient documentation

## 2013-06-08 DIAGNOSIS — Z139 Encounter for screening, unspecified: Secondary | ICD-10-CM

## 2013-06-14 ENCOUNTER — Encounter (HOSPITAL_COMMUNITY): Payer: Self-pay

## 2013-06-14 ENCOUNTER — Ambulatory Visit (HOSPITAL_COMMUNITY): Payer: BC Managed Care – PPO

## 2013-06-14 ENCOUNTER — Other Ambulatory Visit (HOSPITAL_COMMUNITY): Payer: BC Managed Care – PPO

## 2013-08-03 ENCOUNTER — Other Ambulatory Visit: Payer: Self-pay

## 2013-08-04 DIAGNOSIS — D649 Anemia, unspecified: Secondary | ICD-10-CM | POA: Diagnosis not present

## 2013-08-04 DIAGNOSIS — D696 Thrombocytopenia, unspecified: Secondary | ICD-10-CM | POA: Diagnosis not present

## 2013-08-09 ENCOUNTER — Ambulatory Visit (HOSPITAL_COMMUNITY): Payer: BC Managed Care – PPO

## 2013-08-11 ENCOUNTER — Telehealth (HOSPITAL_COMMUNITY): Payer: Self-pay | Admitting: Oncology

## 2013-08-11 NOTE — Telephone Encounter (Signed)
We received another referral for pt. Spoke with Meriel Flavors  and Freida Busman Singleton/Director regarding pt's no shows. I was advised to let  The pt know that if she no shows again she will have to find another provider. I also called [facility] to inform them.

## 2013-08-19 ENCOUNTER — Other Ambulatory Visit: Payer: Self-pay | Admitting: Cardiology

## 2013-08-26 DIAGNOSIS — F329 Major depressive disorder, single episode, unspecified: Secondary | ICD-10-CM | POA: Diagnosis not present

## 2013-08-26 DIAGNOSIS — F411 Generalized anxiety disorder: Secondary | ICD-10-CM | POA: Diagnosis not present

## 2013-08-26 DIAGNOSIS — G8929 Other chronic pain: Secondary | ICD-10-CM | POA: Diagnosis not present

## 2013-08-26 DIAGNOSIS — F3289 Other specified depressive episodes: Secondary | ICD-10-CM | POA: Diagnosis not present

## 2013-08-26 DIAGNOSIS — Z6837 Body mass index (BMI) 37.0-37.9, adult: Secondary | ICD-10-CM | POA: Diagnosis not present

## 2013-08-30 ENCOUNTER — Encounter (HOSPITAL_COMMUNITY): Payer: Self-pay

## 2013-08-30 ENCOUNTER — Encounter (HOSPITAL_COMMUNITY): Payer: Medicare Other | Attending: Hematology and Oncology

## 2013-08-30 VITALS — BP 121/73 | HR 113 | Temp 97.7°F | Resp 16 | Wt 230.1 lb

## 2013-08-30 DIAGNOSIS — D693 Immune thrombocytopenic purpura: Secondary | ICD-10-CM | POA: Insufficient documentation

## 2013-08-30 DIAGNOSIS — D649 Anemia, unspecified: Secondary | ICD-10-CM

## 2013-08-30 DIAGNOSIS — I2699 Other pulmonary embolism without acute cor pulmonale: Secondary | ICD-10-CM

## 2013-08-30 DIAGNOSIS — D500 Iron deficiency anemia secondary to blood loss (chronic): Secondary | ICD-10-CM | POA: Diagnosis not present

## 2013-08-30 LAB — COMPREHENSIVE METABOLIC PANEL
ALT: 44 U/L — ABNORMAL HIGH (ref 0–35)
Alkaline Phosphatase: 51 U/L (ref 39–117)
BUN: 29 mg/dL — ABNORMAL HIGH (ref 6–23)
CO2: 22 mEq/L (ref 19–32)
GFR calc Af Amer: 43 mL/min — ABNORMAL LOW (ref >90)
GFR calc non Af Amer: 37 mL/min — ABNORMAL LOW (ref >90)
Glucose, Bld: 120 mg/dL — ABNORMAL HIGH (ref 70–99)
Potassium: 3.3 mEq/L — ABNORMAL LOW (ref 3.5–5.1)
Sodium: 137 mEq/L (ref 135–145)

## 2013-08-30 LAB — CBC WITH DIFFERENTIAL/PLATELET
Basophils Relative: 0 % (ref 0–1)
Eosinophils Absolute: 0 10*3/uL (ref 0.0–0.7)
HCT: 38 % (ref 36.0–46.0)
Hemoglobin: 11.3 g/dL — ABNORMAL LOW (ref 12.0–15.0)
Lymphocytes Relative: 16 % (ref 12–46)
Lymphs Abs: 1.6 10*3/uL (ref 0.7–4.0)
MCHC: 29.7 g/dL — ABNORMAL LOW (ref 30.0–36.0)
MCV: 75.2 fL — ABNORMAL LOW (ref 78.0–100.0)
Monocytes Relative: 8 % (ref 3–12)
Neutro Abs: 7.5 10*3/uL (ref 1.7–7.7)
RDW: 17.9 % — ABNORMAL HIGH (ref 11.5–15.5)

## 2013-08-30 NOTE — Progress Notes (Signed)
[facility]  OFFICE PROGRESS NOTE  Glo Herring., MD [id number] [place] [address]  DIAGNOSIS: ITP (idiopathic thrombocytopenic purpura) - Plan: CBC with Differential, Comprehensive metabolic panel, CBC with Differential, CBC with Differential, Comprehensive metabolic panel, CBC with Differential  PE (pulmonary embolism)  Anemia iron deficiency - Plan: Iron and TIBC, Ferritin, Fecal occult blood, imunochemical, Iron and TIBC, Ferritin  Chief Complaint  Patient presents with  . ITP  . Anemia    Iron deficiency    CURRENT THERAPY: Dexamethasone 0.5 mg 3 times a day for ITP, status post splenectomy with recent demonstration of low iron levels.  INTERVAL HISTORY: Summer Cook 47 y.o. female returns for followup of ITP, status post splenectomy taking low dose prednisone with recent discovery of low iron levels. She's had rectal bleeding from hemorrhoids. She denies any vaginal bleeding, melena, hemoptysis, epistaxis, or hematuria. She has been more fatigued of late. She denies abdominal pain, nausea, vomiting, diarrhea, constipation, lower extremity swelling or redness, chest pain, PND, orthopnea, or palpitations. She continues her vitamin B12 supplements, insulin injections, and Zoloft.  MEDICAL HISTORY: Past Medical History  Diagnosis Date  . Essential hypertension, benign   . 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  Last was Prednisone 40mg ; 2 wk in 10/2012.  She also was on Prednisone bridged with Cellcept briefly but stopped due to lack of insurance.   . Depression   . Thrush, oral 05/29/2011  . Steroid-induced diabetes   . Pulmonary embolism 04/2012  . Asthma   . Renal insufficiency     INTERIM HISTORY: has Hypertension; ITP (idiopathic thrombocytopenic purpura); Tachycardia; Hypokalemia;  Hemorrhoids; Drug-induced hyperglycemia; Post-splenectomy; Immunosuppressed status; Depression; Obesity; Headache; Renal insufficiency; PE (pulmonary embolism); Rectal bleeding; Abdominal pain, epigastric; Melena; Chronic anticoagulation; Unspecified constipation; Nausea with vomiting; Diarrhea; Abnormal LFTs; Hyponatremia; and Orthostatic hypotension on her problem list.    ALLERGIES:  is allergic to yellow jacket venom.  MEDICATIONS: has a current medication list which includes the following prescription(s): albuterol, alprazolam, vitamin d, dexamethasone, hydrocodone-acetaminophen, insulin aspart, ipratropium-albuterol, metoprolol succinate, multiple vitamins-minerals, ondansetron, sertraline, triamterene-hydrochlorothiazide, vitamin b-12, and prednisone.  SURGICAL HISTORY:  Past Surgical History  Procedure Laterality Date  . Cholecystectomy  2008  . Bone marrow biopsy    . 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 Colonoscopy with esophagogastroduodenoscopy (egd) N/A 01/04/2013    Dr. Gala Romney: esophageal plaques with +KOH, hh. Gastric antrum abnormal, bx reactive gastropathy. Anal canal hemorrhoids, colonic tics, normal TI, single polyp (sessile serrated tubular adenoma). Next TCS 12/2017    FAMILY HISTORY: family history includes AAA (abdominal aortic aneurysm) in her paternal grandmother; Cancer (age of onset: 40) in her paternal grandfather; Cancer (age of onset: 68) in her paternal grandmother; Cervical cancer in her mother; Lung cancer in her father; Pneumonia in her brother.  SOCIAL HISTORY:  reports that she quit smoking about 4 years ago. Her smoking use included Cigarettes. She smoked 0.00 packs per day for 0 years. She has never used smokeless tobacco. She reports that she does not drink alcohol or use illicit drugs.  REVIEW OF SYSTEMS:  Other than that discussed above is noncontributory.  PHYSICAL EXAMINATION: ECOG PERFORMANCE STATUS: 1 - Symptomatic but completely ambulatory  Blood pressure  121/73, pulse 113, temperature 97.7 F (36.5 C), temperature source Oral, resp. rate 16, weight 230 lb 1.6 oz (104.373 kg), last menstrual period  05/13/2011.  GENERAL:alert, no distress and comfortable. Cushingoid appearance. SKIN: skin color, texture, turgor are normal, no rashes or significant lesions EYES: PERLA; Conjunctiva are pink and non-injected, sclera clear OROPHARYNX:no exudate, no erythema on lips, buccal mucosa, or tongue. NECK: supple, thyroid normal size, non-tender, without nodularity. No masses CHEST: Increased AP diameter with no breast masses. LYMPH:  no palpable lymphadenopathy in the cervical, axillary or inguinal LUNGS: clear to auscultation and percussion with normal breathing effort HEART: regular rate & rhythm and no murmurs. ABDOMEN:abdomen soft, non-tender and normal bowel sounds MUSCULOSKELETAL:no cyanosis of digits and no clubbing. Range of motion normal. Left lower stomach and 5 tenderness. Negative Homans sign. NEURO: alert & oriented x 3 with fluent speech, no focal motor/sensory deficits   LABORATORY DATA:  On 08/04/2013 serum iron was 27 with TIBC of 473 with percent saturation of 6%. Reticulocyte count was 2% with ferritin of 16.  Hemoglobin was 11.4. Most recent platelet count on 08/04/2013 was 206,000.  No visits with results within 30 Day(s) from this visit. Latest known visit with results is:  Infusion on 05/17/2013  Component Date Value Range Status  . WBC 05/17/2013 12.9* 4.0 - 10.5 K/uL Final  . RBC 05/17/2013 5.82* 3.87 - 5.11 MIL/uL Final  . Hemoglobin 05/17/2013 14.0  12.0 - 15.0 g/dL Final  . HCT 05/17/2013 46.0  36.0 - 46.0 % Final  . MCV 05/17/2013 79.0  78.0 - 100.0 fL Final  . MCH 05/17/2013 24.1* 26.0 - 34.0 pg Final  . MCHC 05/17/2013 30.4  30.0 - 36.0 g/dL Final  . RDW 05/17/2013 17.3* 11.5 - 15.5 % Final  . Platelets 05/17/2013 104* 150 - 400 K/uL Final   Comment: SPECIMEN CHECKED FOR CLOTS                          PLATELET  COUNT CONFIRMED BY SMEAR    PATHOLOGY: No new pathology. Peripheral smear failed to reveal evidence of platelet clumping.  Urinalysis    Component Value Date/Time   COLORURINE YELLOW 04/05/2013 1325   APPEARANCEUR CLEAR 04/05/2013 1325   LABSPEC 1.010 04/05/2013 1325   PHURINE 6.5 04/05/2013 1325   GLUCOSEU NEGATIVE 04/05/2013 1325   HGBUR MODERATE* 04/05/2013 1325   BILIRUBINUR NEGATIVE 04/05/2013 1325   KETONESUR NEGATIVE 04/05/2013 1325   PROTEINUR TRACE* 04/05/2013 1325   UROBILINOGEN 0.2 04/05/2013 1325   NITRITE NEGATIVE 04/05/2013 1325   LEUKOCYTESUR NEGATIVE 04/05/2013 1325    RADIOGRAPHIC STUDIES: No results found.  ASSESSMENT:  #1. Chronic immune thrombocytopenia, status post lumpectomy, excellent response to dexamethasone. #2. 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 Bleeding rectal hemorrhoids. #3. Hypertension, controlled. #4. Diabetes mellitus, type II, insulin requiring.   PLAN:  #1. Decrease dexamethasone to 0.5 mg daily. #2. Feraheme 1020 mg intravenously in 1 week. #3. Followup in 4 weeks with CBC.   All questions were answered. The patient knows to call the clinic with any problems, questions or concerns. We can certainly see the patient much sooner if necessary.   I spent 25 minutes counseling the patient face to face. The total time spent in the appointment was 30 minutes.    Doroteo Bradford, MD 08/30/2013 4:47 PM

## 2013-08-30 NOTE — Patient Instructions (Addendum)
[facility] Discharge Instructions  RECOMMENDATIONS MADE BY THE CONSULTANT AND ANY TEST RESULTS WILL BE SENT TO YOUR REFERRING PHYSICIAN.  Decrease dexamethasone to 0.5mg  daily. Lab work today. We will get you scheduled for IV iron within the next week. Return to clinic in 4 weeks to see MD.  Thank you for choosing [facility] to provide your oncology and hematology care.  To afford each patient quality time with our providers, please arrive at least 15 minutes before your scheduled appointment time.  With your help, our goal is to use those 15 minutes to complete the necessary work-up to ensure our physicians have the information they need to help with your evaluation and healthcare recommendations.    Effective January 1st, 2014, we ask that you re-schedule your appointment with our physicians should you arrive 10 or more minutes late for your appointment.  We strive to give you quality time with our providers, and arriving late affects you and other patients whose appointments are after yours.    Again, thank you for choosing [facility].  Our hope is that these requests will decrease the amount of time that you wait before being seen by our physicians.       _____________________________________________________________  Should you have questions after your visit to [facility], please contact our office at [phone] between the hours of 8:30 a.m. and 5:00 p.m.  Voicemails left after 4:30 p.m. will not be returned until the following business day.  For prescription refill requests, have your pharmacy contact our office with your prescription refill request.

## 2013-08-31 LAB — IRON AND TIBC: Iron: <10 ug/dL — ABNORMAL LOW (ref 42–135)

## 2013-09-01 ENCOUNTER — Encounter (HOSPITAL_BASED_OUTPATIENT_CLINIC_OR_DEPARTMENT_OTHER): Payer: Medicare Other

## 2013-09-01 VITALS — BP 112/61 | HR 113 | Temp 98.9°F | Resp 22

## 2013-09-01 DIAGNOSIS — K625 Hemorrhage of anus and rectum: Secondary | ICD-10-CM | POA: Diagnosis not present

## 2013-09-01 DIAGNOSIS — D693 Immune thrombocytopenic purpura: Secondary | ICD-10-CM | POA: Diagnosis not present

## 2013-09-01 MED ORDER — SODIUM CHLORIDE 0.9 % IJ SOLN
10.0000 mL | INTRAMUSCULAR | Status: DC | PRN
  Administered 2013-09-01: 10 mL

## 2013-09-01 MED ORDER — SODIUM CHLORIDE 0.9 % IV SOLN
1020.0000 mg | Freq: Once | INTRAVENOUS | Status: AC
  Administered 2013-09-01: 1020 mg via INTRAVENOUS
  Filled 2013-09-01: qty 34

## 2013-09-01 NOTE — Progress Notes (Signed)
Fereheme 1020 mgs given iv  as ordered.  Tolerated well.

## 2013-09-26 ENCOUNTER — Ambulatory Visit (HOSPITAL_COMMUNITY): Payer: BC Managed Care – PPO

## 2013-09-27 NOTE — Progress Notes (Signed)
This encounter was created in error - please disregard.

## 2013-09-29 ENCOUNTER — Encounter (HOSPITAL_COMMUNITY): Payer: Self-pay

## 2013-10-05 ENCOUNTER — Ambulatory Visit (HOSPITAL_COMMUNITY): Payer: BC Managed Care – PPO

## 2013-10-05 ENCOUNTER — Other Ambulatory Visit (HOSPITAL_COMMUNITY): Payer: BC Managed Care – PPO

## 2013-10-06 NOTE — Progress Notes (Signed)
This encounter was created in error - please disregard.

## 2013-10-08 IMAGING — CR DG CHEST 2V
2 series · 2 of 2 positions shown · non-contrast
Comparison: 06/08/2011, 02/03/2011

CLINICAL DATA: Chest pain, shortness of breath, hypertension,
diabetes

CHEST - 2 VIEW

[view not recorded (1 of 2)]
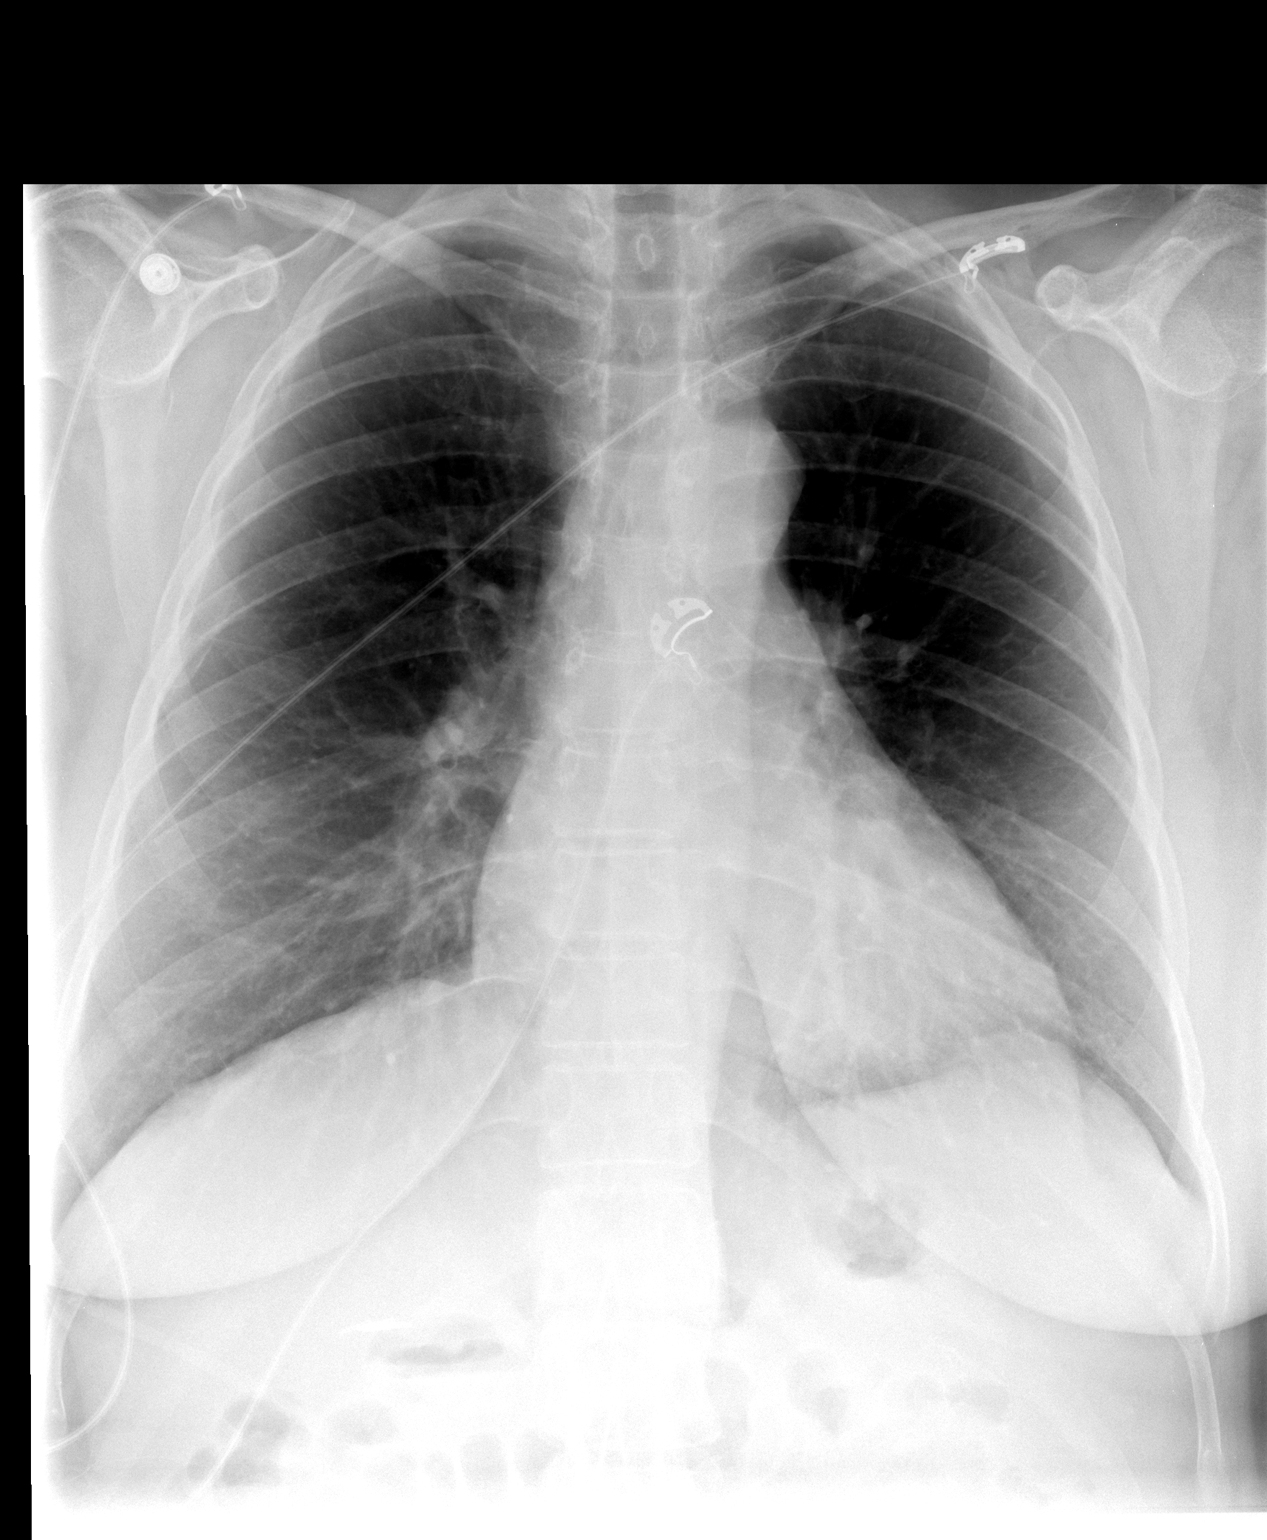

[view not recorded (2 of 2)]
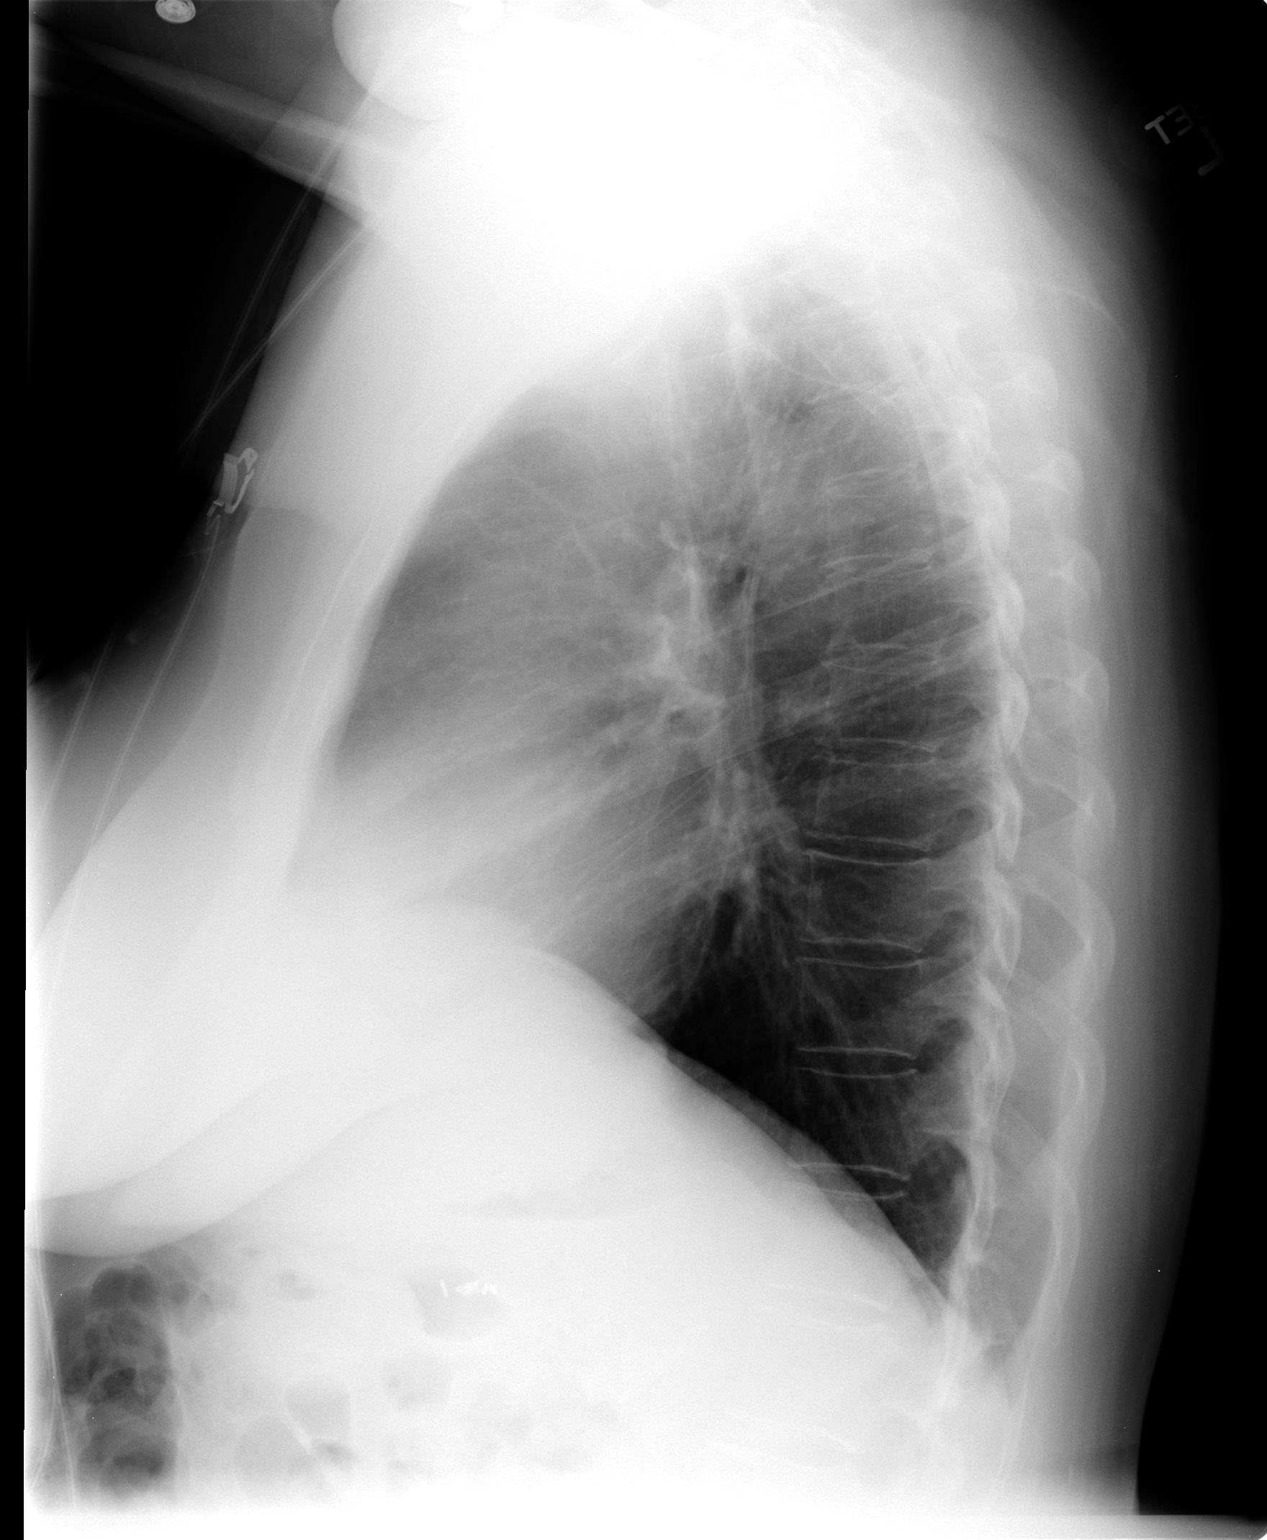

[2 of 2 positions shown; findings below may reference images not displayed]

FINDINGS: The heart size and mediastinal contours are within
normal limits.  Both lungs are clear.  The visualized skeletal
structures are unremarkable.
IMPRESSION: No active cardiopulmonary disease.

## 2013-10-08 IMAGING — CT CT HEAD W/O CM
1 series · 16 of 30 positions shown, 20 images · non-contrast
Comparison: 12/18/2011

CLINICAL DATA: Headaches, diabetes

CT HEAD WITHOUT CONTRAST
TECHNIQUE: Contiguous axial images were obtained from the base of
the skull through the vertex without contrast

[Series 2: headseq 4.8 h37s · axial · 0.43mm/px · z∈[+145,+305]mm · 16 of 36 slices shown, 20 images]
[im 2/36  brain]
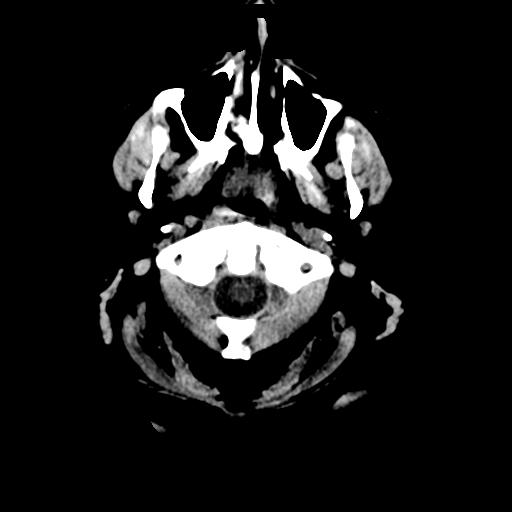
[im 2/36  bone]
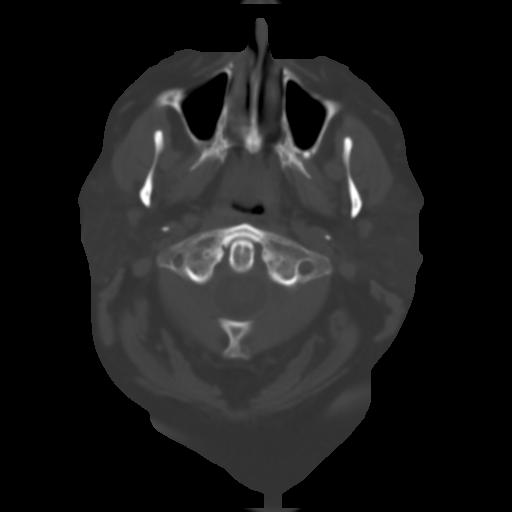
[im 4/36  brain]
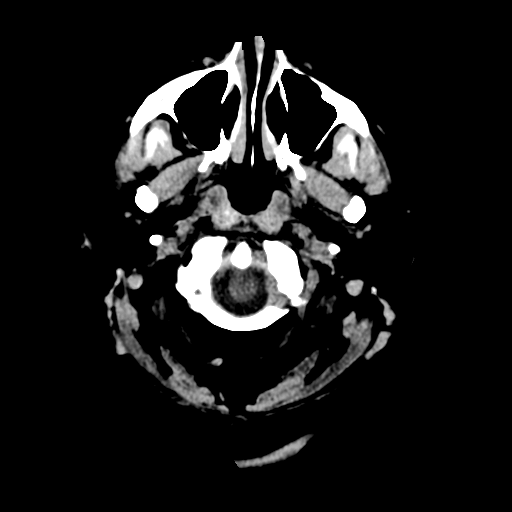
[im 7/36  brain]
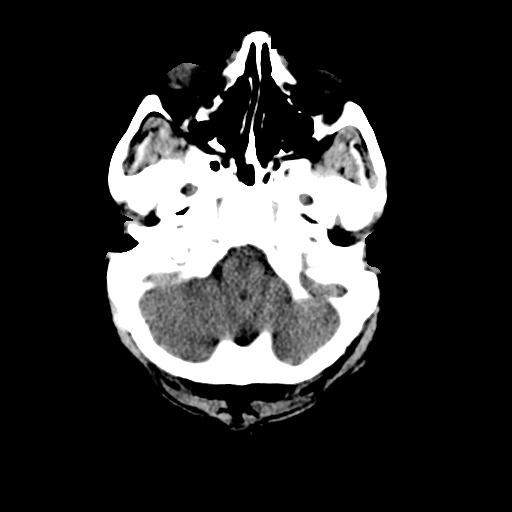
[im 9/36  brain]
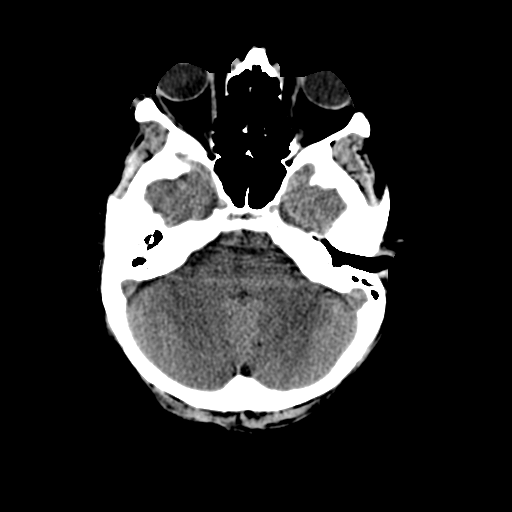
[im 10/36  brain]
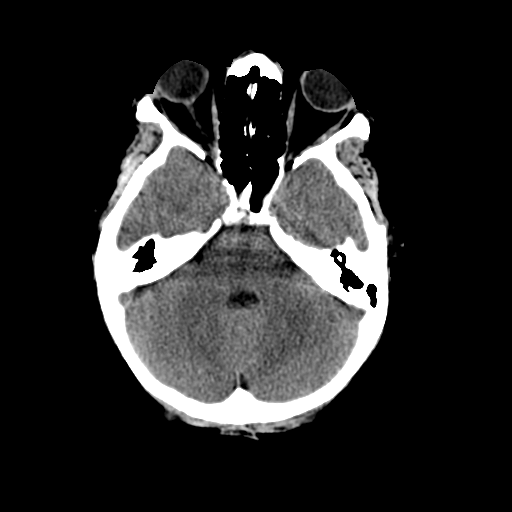
[im 10/36  bone]
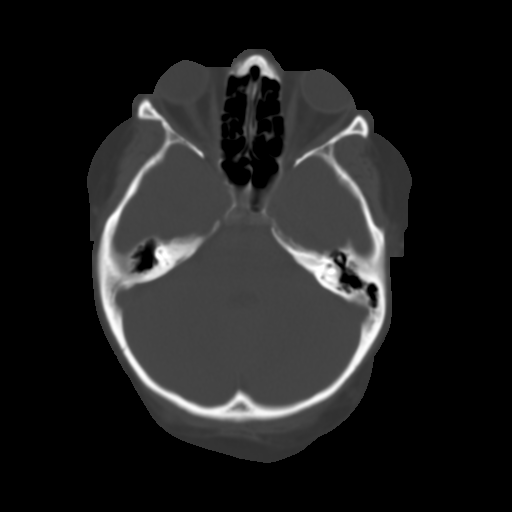
[im 13/36  brain]
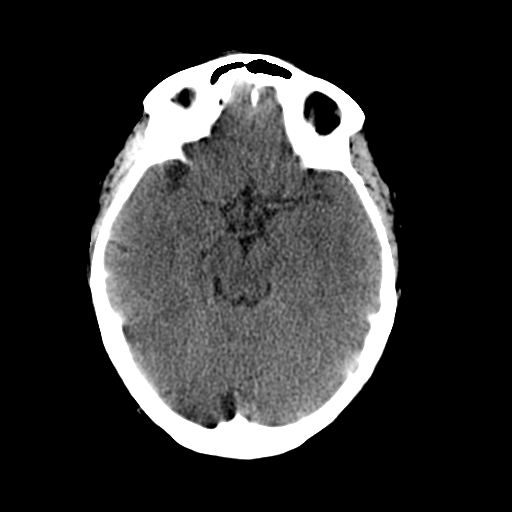
[im 15/36  brain]
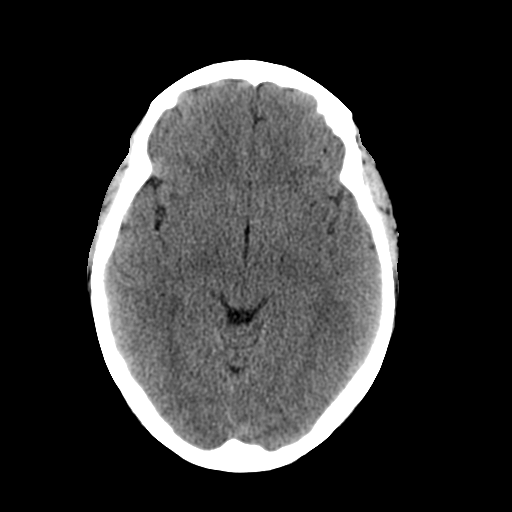
[im 17/36  brain]
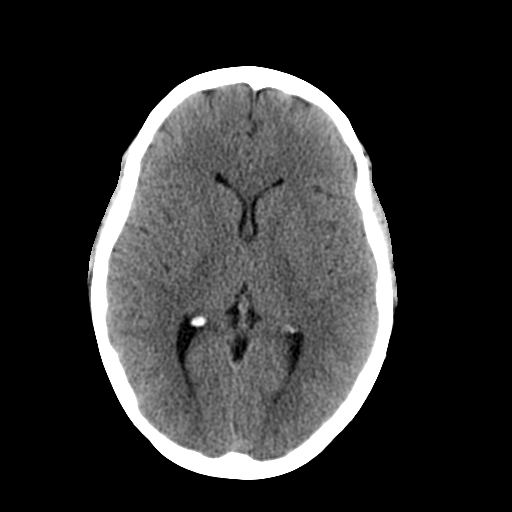
[im 19/36  brain]
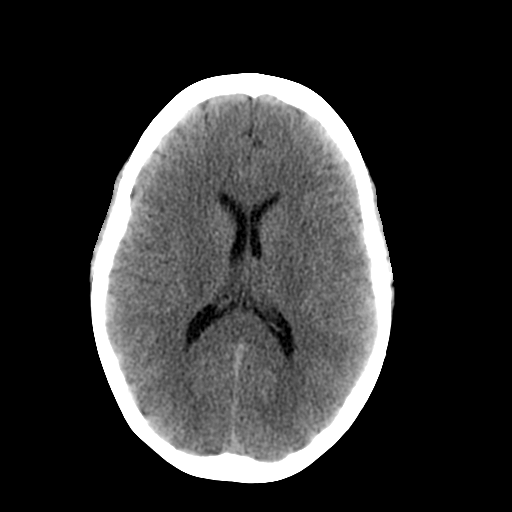
[im 19/36  bone]
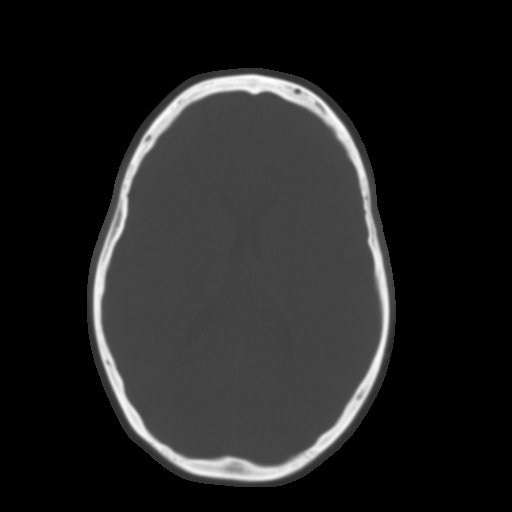
[im 21/36  brain]
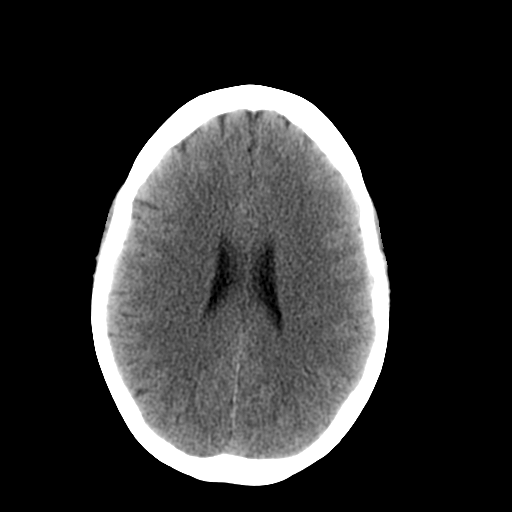
[im 23/36  brain]
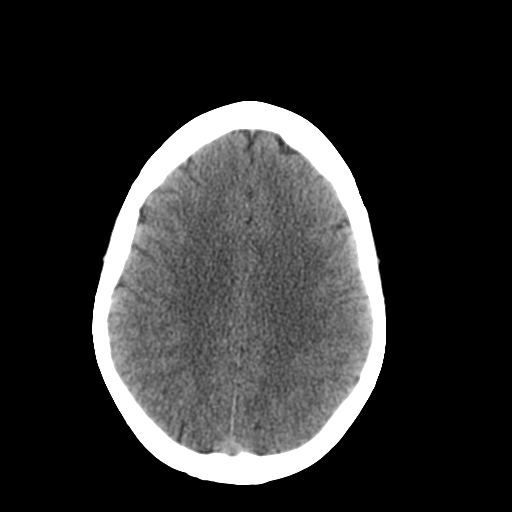
[im 26/36  brain]
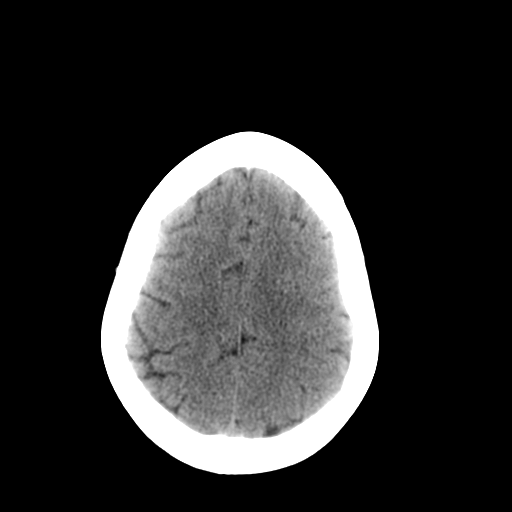
[im 27/36  brain]
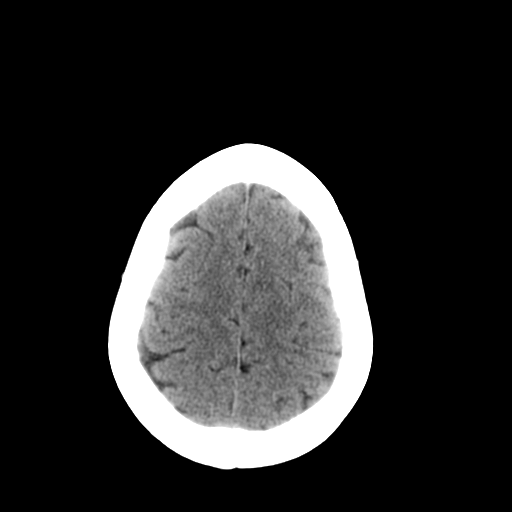
[im 27/36  bone]
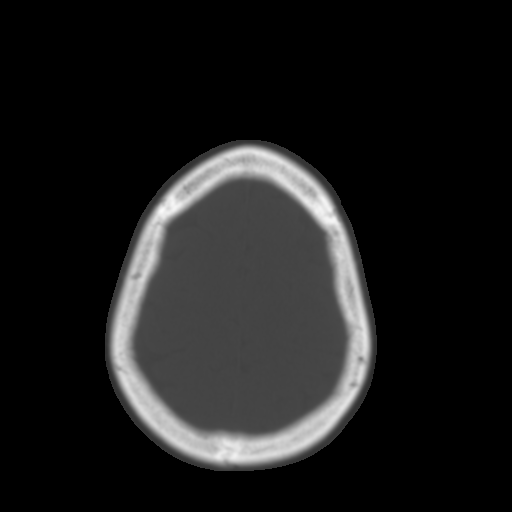
[im 29/36  brain]
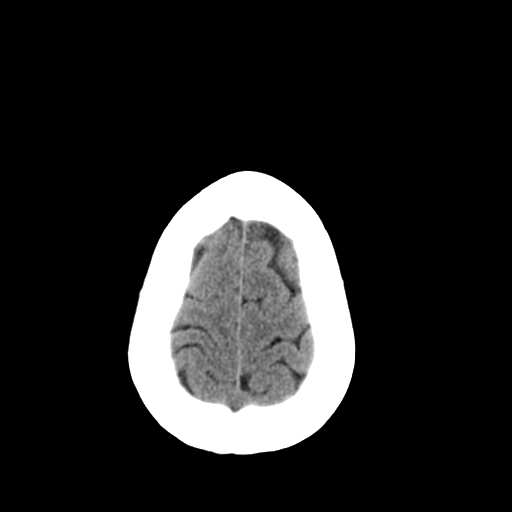
[im 32/36  brain]
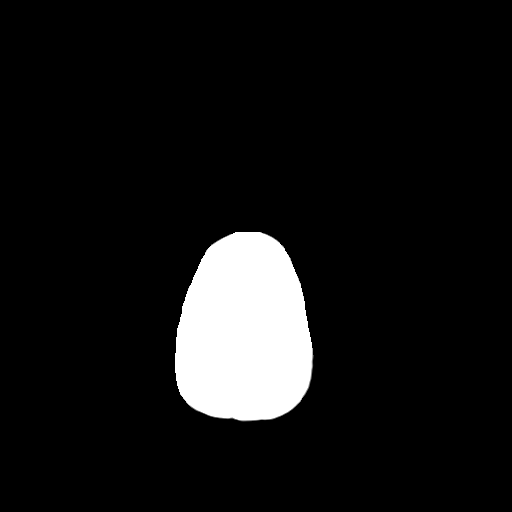
[im 34/36  brain]
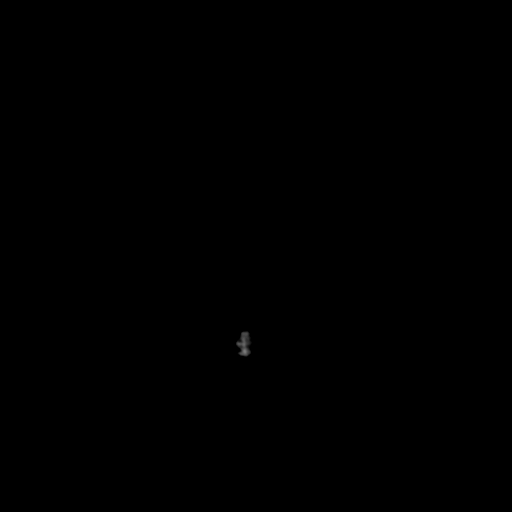

[16 of 30 positions shown; findings below may reference images not displayed]

FINDINGS: The brain has a normal appearance without evidence for
hemorrhage, acute infarction, hydrocephalus, or mass lesion.  There
is no extra axial fluid collection.  The skull and paranasal
sinuses are normal.
IMPRESSION: Normal CT of the head without contrast.

## 2013-10-09 IMAGING — US US EXTREM LOW VENOUS BILAT
1 series · 14 of 24 positions shown · non-contrast
Comparison: Bilateral venous lower extremity ultrasound 08/26/2011

CLINICAL DATA: Question DVT.  Pain.

BILATERAL LOWER EXTREMITY VENOUS DUPLEX ULTRASOUND
TECHNIQUE: Gray-scale sonography with graded compression, as well
as color Doppler and duplex ultrasound, were performed to evaluate
the deep venous system of both lower extremities from the level of
the common femoral vein through the popliteal and proximal calf
veins.  Spectral Doppler was utilized to evaluate flow at rest and
with distal augmentation maneuvers.

[Series 1: us extrem low venous bilat · 0.09mm/px · 14 of 52 slices shown]
[im 1/52]
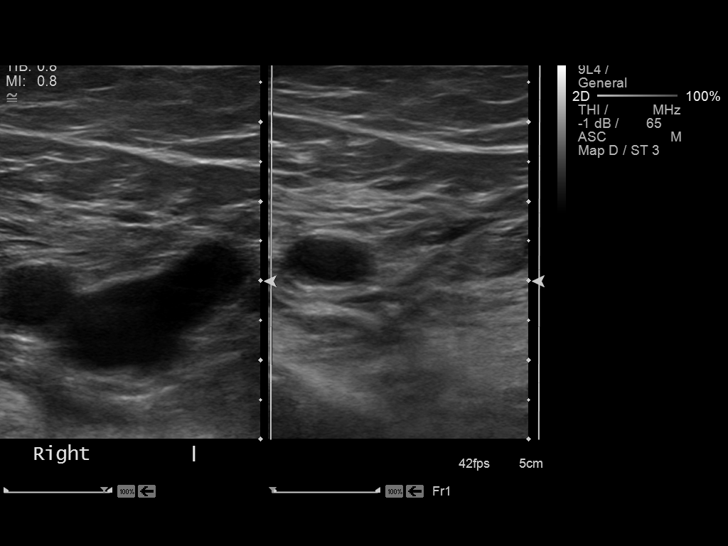
[im 5/52]
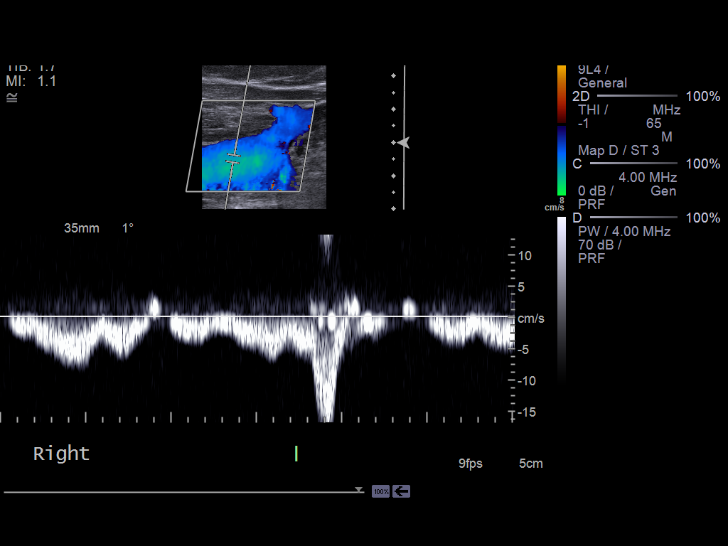
[im 9/52]
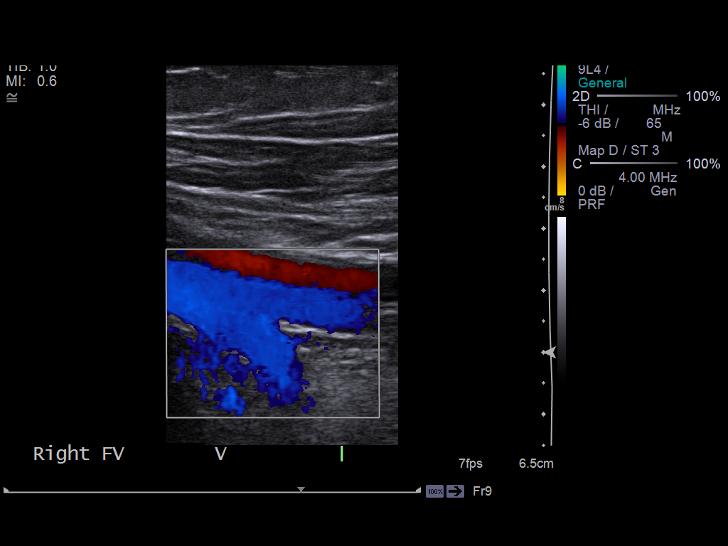
[im 14/52]
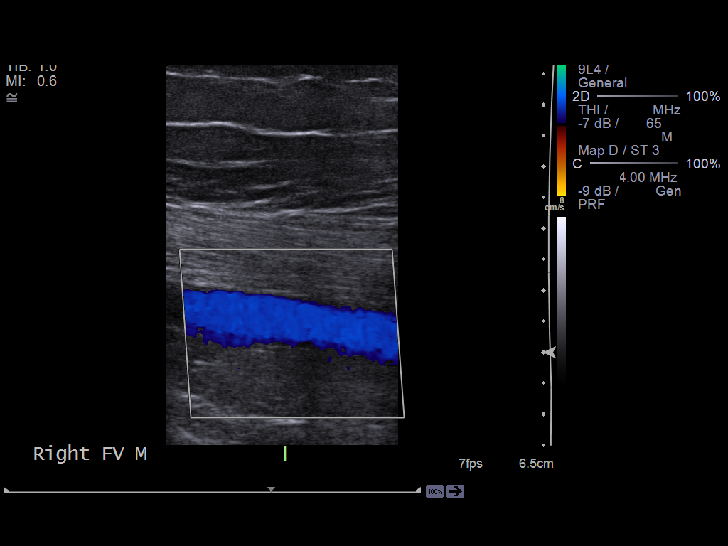
[im 16/52]
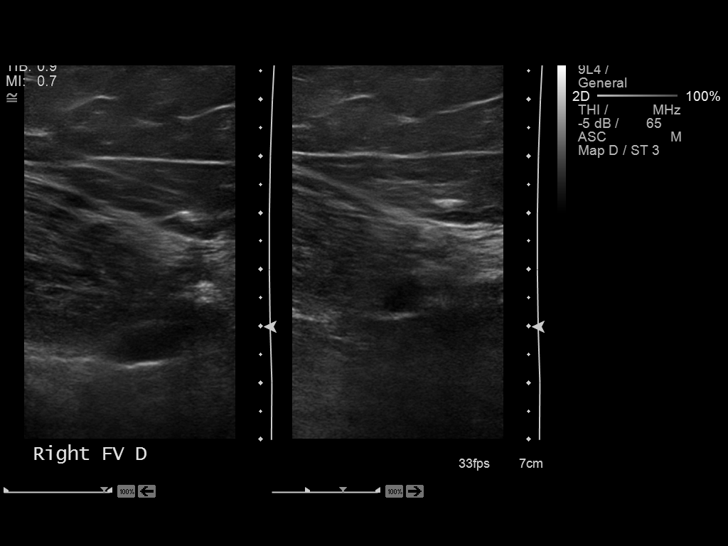
[im 20/52]
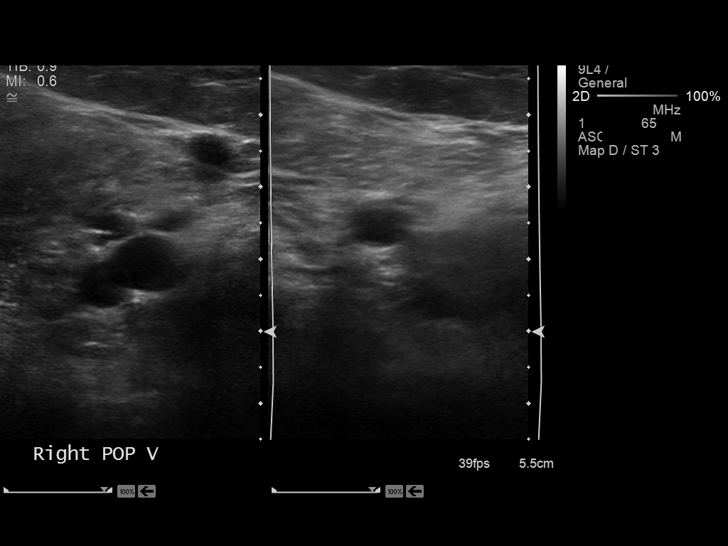
[im 25/52]
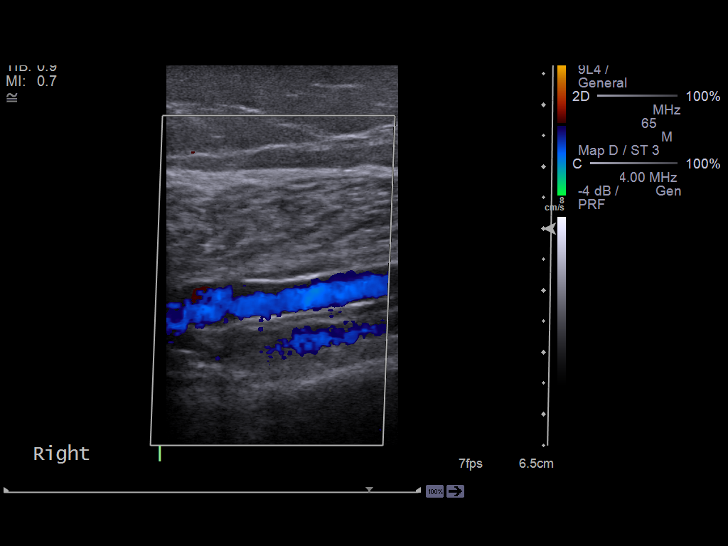
[im 27/52]
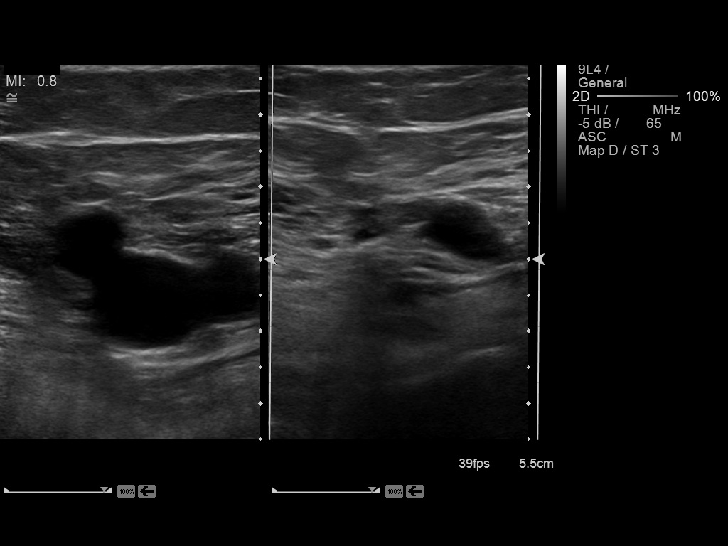
[im 32/52]
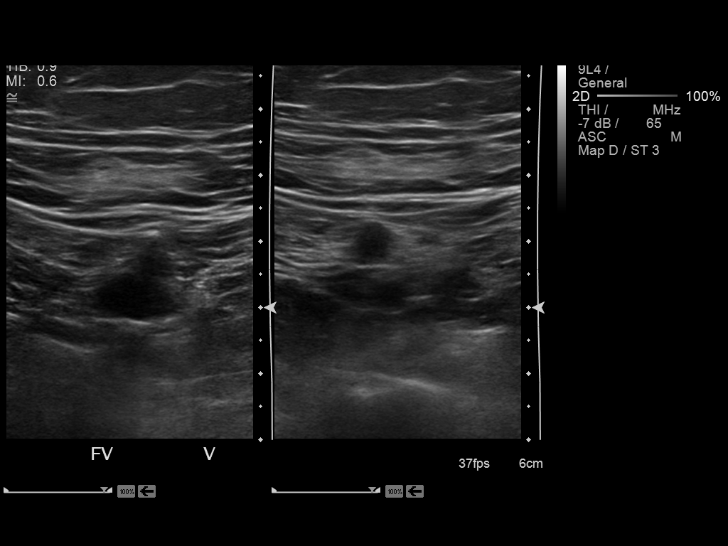
[im 36/52]
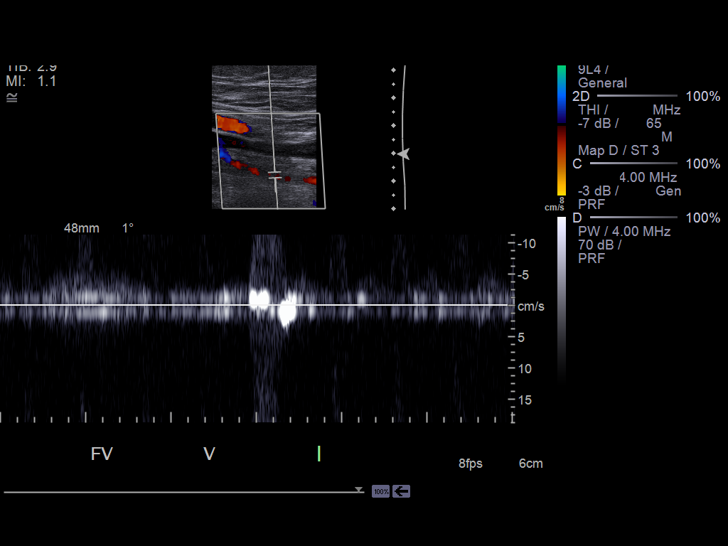
[im 40/52]
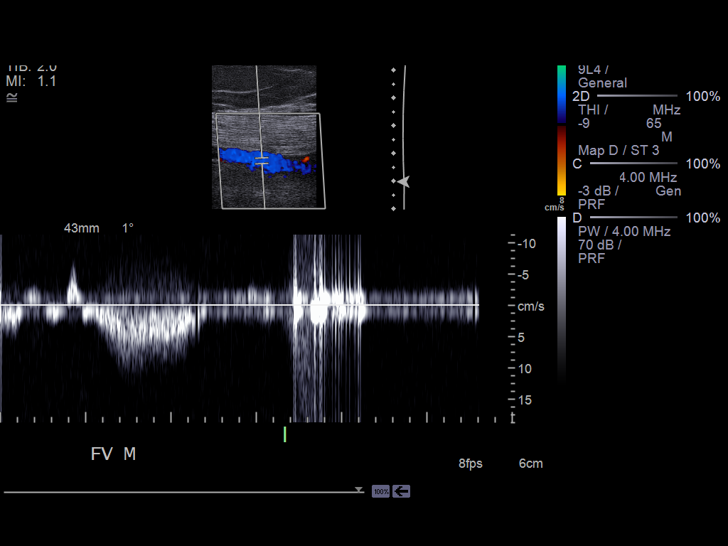
[im 43/52]
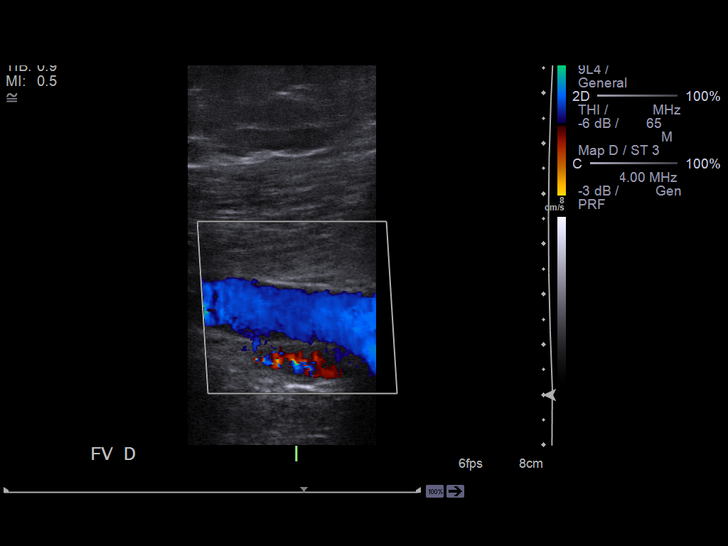
[im 47/52]
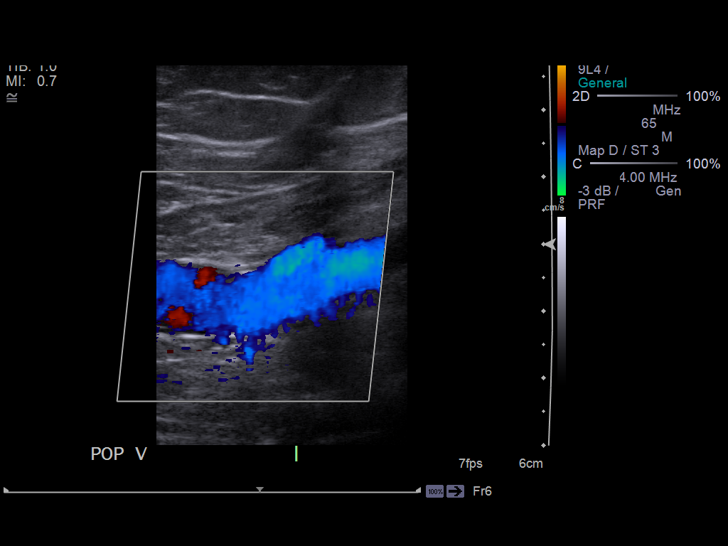
[im 52/52]
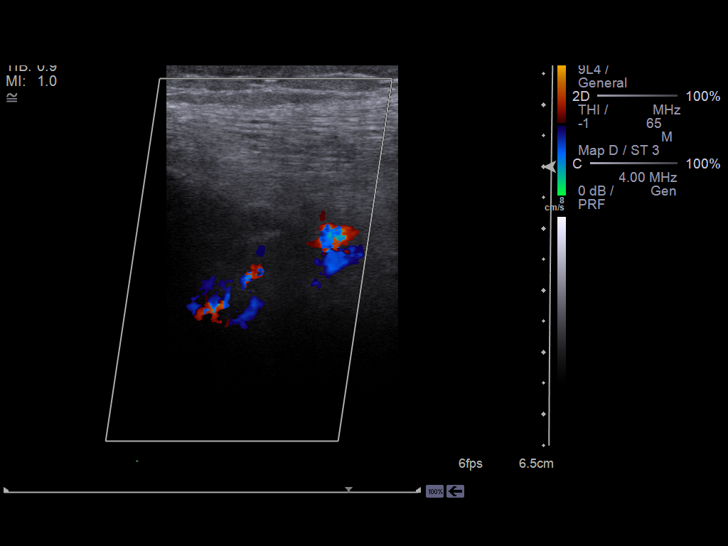

[14 of 24 positions shown; findings below may reference images not displayed]

FINDINGS: Normal compressibility of bilateral common femoral,
superficial femoral, and popliteal veins is demonstrated, as well
as the visualized proximal calf veins.  No filling defects to
suggest DVT on grayscale or color Doppler imaging.  Doppler
waveforms show normal direction of venous flow, normal respiratory
phasicity and response to augmentation.
IMPRESSION: No evidence of deep vein thrombosis in either lower extremity.

## 2013-10-31 DIAGNOSIS — G5620 Lesion of ulnar nerve, unspecified upper limb: Secondary | ICD-10-CM | POA: Diagnosis not present

## 2013-10-31 DIAGNOSIS — J018 Other acute sinusitis: Secondary | ICD-10-CM | POA: Diagnosis not present

## 2013-10-31 DIAGNOSIS — M7710 Lateral epicondylitis, unspecified elbow: Secondary | ICD-10-CM | POA: Diagnosis not present

## 2013-10-31 DIAGNOSIS — R609 Edema, unspecified: Secondary | ICD-10-CM | POA: Diagnosis not present

## 2013-10-31 DIAGNOSIS — Z6835 Body mass index (BMI) 35.0-35.9, adult: Secondary | ICD-10-CM | POA: Diagnosis not present

## 2013-11-09 ENCOUNTER — Encounter (HOSPITAL_BASED_OUTPATIENT_CLINIC_OR_DEPARTMENT_OTHER): Payer: Medicare Other

## 2013-11-09 ENCOUNTER — Encounter (HOSPITAL_COMMUNITY): Payer: Self-pay

## 2013-11-09 ENCOUNTER — Encounter (HOSPITAL_COMMUNITY): Payer: Medicare Other | Attending: Hematology and Oncology

## 2013-11-09 VITALS — BP 118/89 | HR 93 | Temp 98.0°F | Resp 18 | Wt 223.5 lb

## 2013-11-09 DIAGNOSIS — D693 Immune thrombocytopenic purpura: Secondary | ICD-10-CM

## 2013-11-09 DIAGNOSIS — D509 Iron deficiency anemia, unspecified: Secondary | ICD-10-CM

## 2013-11-09 DIAGNOSIS — D649 Anemia, unspecified: Secondary | ICD-10-CM | POA: Insufficient documentation

## 2013-11-09 DIAGNOSIS — I2699 Other pulmonary embolism without acute cor pulmonale: Secondary | ICD-10-CM | POA: Insufficient documentation

## 2013-11-09 DIAGNOSIS — Z9081 Acquired absence of spleen: Secondary | ICD-10-CM

## 2013-11-09 LAB — D-DIMER, QUANTITATIVE: D-Dimer, Quant: 1.53 ug/mL-FEU — ABNORMAL HIGH (ref 0.00–0.48)

## 2013-11-09 LAB — FERRITIN: FERRITIN: 428 ng/mL — AB (ref 10–291)

## 2013-11-09 NOTE — Progress Notes (Signed)
[facility]  OFFICE PROGRESS NOTE  Glo Herring., MD [address] [place] [address]  DIAGNOSIS: ITP (idiopathic thrombocytopenic purpura) - Plan: CBC with Differential  Post-splenectomy  Anemia, iron deficiency - Plan: CBC with Differential, Ferritin  PE (pulmonary embolism) - Plan: D-dimer, quantitative  Chief Complaint  Patient presents with  . ITP  . Iron deficiency    CURRENT THERAPY: Intravenous Feraheme 09/01/2013, dexamethasone 1.0 mg daily  INTERVAL HISTORY: Summer Cook 48 y.o. female returns for followup of immune thrombocytopenia while on steroid therapy, status post intravenous iron infusion on 09/01/2013 for concurrent iron deficiency.  She tolerated the infusion of iron well. She has had chronic numbness involving the left fourth and fifth fingers and is about to be evaluated by a neurologist. She tolerates 1 mg of dexamethasone better than 0.5 mg and has maintained yourself on that dosage. She denies any epistaxis, easy bruising, melena, hematochezia, hematuria, lower extremity swelling or redness but with some discomfort involving the right calf. She denies he chest pain, PND, orthopnea, or palpitations. She also denies any sore throat, headache, or seizure activity.  MEDICAL HISTORY: Past Medical History  Diagnosis Date  . Essential hypertension, benign   . 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  Last was Prednisone $RemoveBefore'40mg'IWjOeOExvvdHr$ ; 2 wk in 10/2012.  She also was on Prednisone bridged with Cellcept briefly but stopped due to lack of insurance.   . Depression   . Thrush, oral 05/29/2011  . Steroid-induced diabetes   . Pulmonary embolism 04/2012  . Asthma   . Renal insufficiency     INTERIM HISTORY: has Hypertension; ITP (idiopathic thrombocytopenic purpura); Tachycardia; Hypokalemia;  Hemorrhoids; Drug-induced hyperglycemia; Post-splenectomy; Immunosuppressed status; Depression; Obesity; Headache; Renal insufficiency; PE (pulmonary embolism); Rectal bleeding; Abdominal pain, epigastric; Melena; Chronic anticoagulation; Unspecified constipation; Nausea with vomiting; Diarrhea; Abnormal LFTs; Hyponatremia; and Orthostatic hypotension on her problem list.   ITP (idiopathic thrombocytopenic purpura)  08/2010  Treated with Prednisone, IVIg (transient response), Rituxan (no response), Cytoxan (no response). Last was Prednisone $RemoveBefore'40mg'qHMoYrUInKxXl$ ; 2 wk in 10/2012. She also was on Prednisone bridged with Cellcept briefly but stopped due to lack of insurance  ALLERGIES:  is allergic to yellow jacket venom.  MEDICATIONS: has a current medication list which includes the following prescription(s): albuterol, alprazolam, vitamin d, dexamethasone, furosemide, hydrocodone-acetaminophen, insulin aspart, ipratropium-albuterol, metoprolol succinate, multiple vitamins-minerals, sertraline, triamterene-hydrochlorothiazide, vitamin b-12, ondansetron, and prednisone.  SURGICAL HISTORY:  Past Surgical History  Procedure Laterality Date  . Cholecystectomy  2008  . Bone marrow biopsy    . 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 Colonoscopy with esophagogastroduodenoscopy (egd) N/A 01/04/2013    Dr. Gala Romney: esophageal plaques with +KOH, hh. Gastric antrum abnormal, bx reactive gastropathy. Anal canal hemorrhoids, colonic tics, normal TI, single polyp (sessile serrated tubular adenoma). Next TCS 12/2017    FAMILY HISTORY: family history includes AAA (abdominal aortic aneurysm) in her paternal grandmother; Cancer (age of onset: 6) in her paternal grandfather; Cancer (age of onset: 48) in her paternal grandmother; Cervical cancer in her mother; Lung cancer in her father; Pneumonia in her brother.  SOCIAL HISTORY:  reports that she quit smoking about 4 years ago. Her smoking use included Cigarettes. She smoked 0.00 packs per day for  0 years. She has never used smokeless tobacco. She reports that she does not drink alcohol or use illicit drugs.  REVIEW OF  SYSTEMS:  Other than that discussed above is noncontributory.  PHYSICAL EXAMINATION: ECOG PERFORMANCE STATUS: 1 - Symptomatic but completely ambulatory  Blood pressure 118/89, pulse 93, temperature 98 F (36.7 C), temperature source Oral, resp. rate 18, weight 223 lb 8 oz (101.379 kg), last menstrual period 05/13/2011.  GENERAL:alert, no distress and comfortable. Moderately obese. Cushingoid appearance. SKIN: skin color, texture, turgor are normal, no rashes or significant lesions. Superficial to ligate hazy eye on the cheeks. EYES: PERLA; Conjunctiva are pink and non-injected, sclera clear OROPHARYNX:no exudate, no erythema on lips, buccal mucosa, or tongue. NECK: supple, thyroid normal size, non-tender, without nodularity. No masses CHEST: Normal AP diameter with no breast masses. LYMPH:  no palpable lymphadenopathy in the cervical, axillary or inguinal LUNGS: clear to auscultation and percussion with normal breathing effort HEART: regular rate & rhythm and no murmurs. ABDOMEN:abdomen soft, non-tender and normal bowel sounds. Spleen is absent. MUSCULOSKELETAL:no cyanosis of digits and no clubbing. Range of motion normal.  NEURO: alert & oriented x 3 with fluent speech, no focal motor/sensory deficits. Decrease pinprick sensation left fifth digit.   LABORATORY DATA: Office Visit on 11/09/2013  Component Date Value Ref Range Status  . WBC 11/09/2013 8.3  4.0 - 10.5 K/uL Final  . RBC 11/09/2013 5.56* 3.87 - 5.11 MIL/uL Final  . Hemoglobin 11/09/2013 15.3* 12.0 - 15.0 g/dL Final  . HCT 11/09/2013 48.2* 36.0 - 46.0 % Final  . MCV 11/09/2013 86.7  78.0 - 100.0 fL Final  . MCH 11/09/2013 27.5  26.0 - 34.0 pg Final  . MCHC 11/09/2013 31.7  30.0 - 36.0 g/dL Final  . RDW 11/09/2013 23.0* 11.5 - 15.5 % Final  . Platelets 11/09/2013 SPECIMEN CHECKED FOR CLOTS  150 - 400  K/uL Final   PLATELET COUNT CONFIRMED BY SMEAR  . Neutrophils Relative % 11/09/2013 56  43 - 77 % Final  . Neutro Abs 11/09/2013 4.7  1.7 - 7.7 K/uL Final  . Lymphocytes Relative 11/09/2013 28  12 - 46 % Final  . Lymphs Abs 11/09/2013 2.4  0.7 - 4.0 K/uL Final  . Monocytes Relative 11/09/2013 14* 3 - 12 % Final  . Monocytes Absolute 11/09/2013 1.1* 0.1 - 1.0 K/uL Final  . Eosinophils Relative 11/09/2013 1  0 - 5 % Final  . Eosinophils Absolute 11/09/2013 0.1  0.0 - 0.7 K/uL Final  . Basophils Relative 11/09/2013 1  0 - 1 % Final  . Basophils Absolute 11/09/2013 0.0  0.0 - 0.1 K/uL Final  . D-Dimer, Quant 11/09/2013 1.53* 0.00 - 0.48 ug/mL-FEU Final   Comment:                                 AT THE INHOUSE ESTABLISHED CUTOFF                          VALUE OF 0.48 ug/mL FEU,                          THIS ASSAY HAS BEEN DOCUMENTED                          IN THE LITERATURE TO HAVE                          A SENSITIVITY AND NEGATIVE  PREDICTIVE VALUE OF AT LEAST                          98 TO 99%.  THE TEST RESULT                          SHOULD BE CORRELATED WITH                          AN ASSESSMENT OF THE CLINICAL                          PROBABILITY OF DVT / VTE.    PATHOLOGY: Peripheral smear failed to reveal evidence of platelet clumping.  Urinalysis    Component Value Date/Time   COLORURINE YELLOW 04/05/2013 1325   APPEARANCEUR CLEAR 04/05/2013 1325   LABSPEC 1.010 04/05/2013 1325   PHURINE 6.5 04/05/2013 1325   GLUCOSEU NEGATIVE 04/05/2013 1325   HGBUR MODERATE* 04/05/2013 1325   BILIRUBINUR NEGATIVE 04/05/2013 1325   KETONESUR NEGATIVE 04/05/2013 1325   PROTEINUR TRACE* 04/05/2013 1325   UROBILINOGEN 0.2 04/05/2013 1325   NITRITE NEGATIVE 04/05/2013 1325   LEUKOCYTESUR NEGATIVE 04/05/2013 1325    RADIOGRAPHIC STUDIES: No results found.  ASSESSMENT:  #1. Chronic immune thrombocytopenia, requiring 1 mg of dexamethasone daily, status post splenectomy. #2. 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 #3. Hypertension, controlled. #4. Diabetes mellitus, type II, insulin requiring, controlled. Fasting blood sugars in the 110 mg percent range #5. High index of suspicion for carpal tunnel syndrome on the left.   PLAN:  #1. Continue dexamethasone 1 mg daily. #2. Followup in one month with CBC. Encouraged to pursue neurology appointment.   All questions were answered. The patient knows to call the clinic with any problems, questions or concerns. We can certainly see the patient much sooner if necessary.   I spent 25 minutes counseling the patient face to face. The total time spent in the appointment was 30 minutes.    Doroteo Bradford, MD 11/09/2013 11:08 AM

## 2013-11-09 NOTE — Progress Notes (Signed)
labs drawn today for cbc/diff,dimer,ferr

## 2013-11-09 NOTE — Patient Instructions (Signed)
[facility] Discharge Instructions  RECOMMENDATIONS MADE BY THE CONSULTANT AND ANY TEST RESULTS WILL BE SENT TO YOUR REFERRING PHYSICIAN.  EXAM FINDINGS BY THE PHYSICIAN TODAY AND SIGNS OR SYMPTOMS TO REPORT TO CLINIC OR PRIMARY PHYSICIAN: Exam and findings as discussed by Dr. Barnet Glasgow.  We will check your labs today and if any changes are needed, we will let you know.  Report unusual bruising or bleeding.     INSTRUCTIONS/FOLLOW-UP: Follow-up with labs and office visit in 1 month.  Thank you for choosing [facility] to provide your oncology and hematology care.  To afford each patient quality time with our providers, please arrive at least 15 minutes before your scheduled appointment time.  With your help, our goal is to use those 15 minutes to complete the necessary work-up to ensure our physicians have the information they need to help with your evaluation and healthcare recommendations.    Effective January 1st, 2014, we ask that you re-schedule your appointment with our physicians should you arrive 10 or more minutes late for your appointment.  We strive to give you quality time with our providers, and arriving late affects you and other patients whose appointments are after yours.    Again, thank you for choosing [facility].  Our hope is that these requests will decrease the amount of time that you wait before being seen by our physicians.       _____________________________________________________________  Should you have questions after your visit to [facility], please contact our office at [phone] between the hours of 8:30 a.m. and 5:00 p.m.  Voicemails left after 4:30 p.m. will not be returned until the following business day.  For prescription refill requests, have your pharmacy contact our office with your prescription refill request.

## 2013-11-15 LAB — CBC WITH DIFFERENTIAL/PLATELET
BASOS PCT: 1 % (ref 0–1)
Basophils Absolute: 0 10*3/uL (ref 0.0–0.1)
Eosinophils Absolute: 0.1 10*3/uL (ref 0.0–0.7)
Eosinophils Relative: 1 % (ref 0–5)
HCT: 48.2 % — ABNORMAL HIGH (ref 36.0–46.0)
Hemoglobin: 15.3 g/dL — ABNORMAL HIGH (ref 12.0–15.0)
Lymphocytes Relative: 28 % (ref 12–46)
Lymphs Abs: 2.4 10*3/uL (ref 0.7–4.0)
MCH: 27.5 pg (ref 26.0–34.0)
MCHC: 31.7 g/dL (ref 30.0–36.0)
MCV: 86.7 fL (ref 78.0–100.0)
Monocytes Absolute: 1.1 10*3/uL — ABNORMAL HIGH (ref 0.1–1.0)
Monocytes Relative: 14 % — ABNORMAL HIGH (ref 3–12)
NEUTROS PCT: 56 % (ref 43–77)
Neutro Abs: 4.7 10*3/uL (ref 1.7–7.7)
PLATELETS: 87 10*3/uL — AB (ref 150–400)
RBC: 5.56 MIL/uL — ABNORMAL HIGH (ref 3.87–5.11)
RDW: 23 % — ABNORMAL HIGH (ref 11.5–15.5)
WBC: 8.3 10*3/uL (ref 4.0–10.5)

## 2013-11-17 ENCOUNTER — Institutional Professional Consult (permissible substitution): Payer: Self-pay | Admitting: Neurology

## 2013-11-27 IMAGING — CR DG ANKLE COMPLETE 3+V*L*
3 series · 3 of 3 positions shown · non-contrast
Comparison: None.

CLINICAL DATA: Injury

LEFT ANKLE COMPLETE - 3+ VIEW

[view not recorded (1 of 3)]
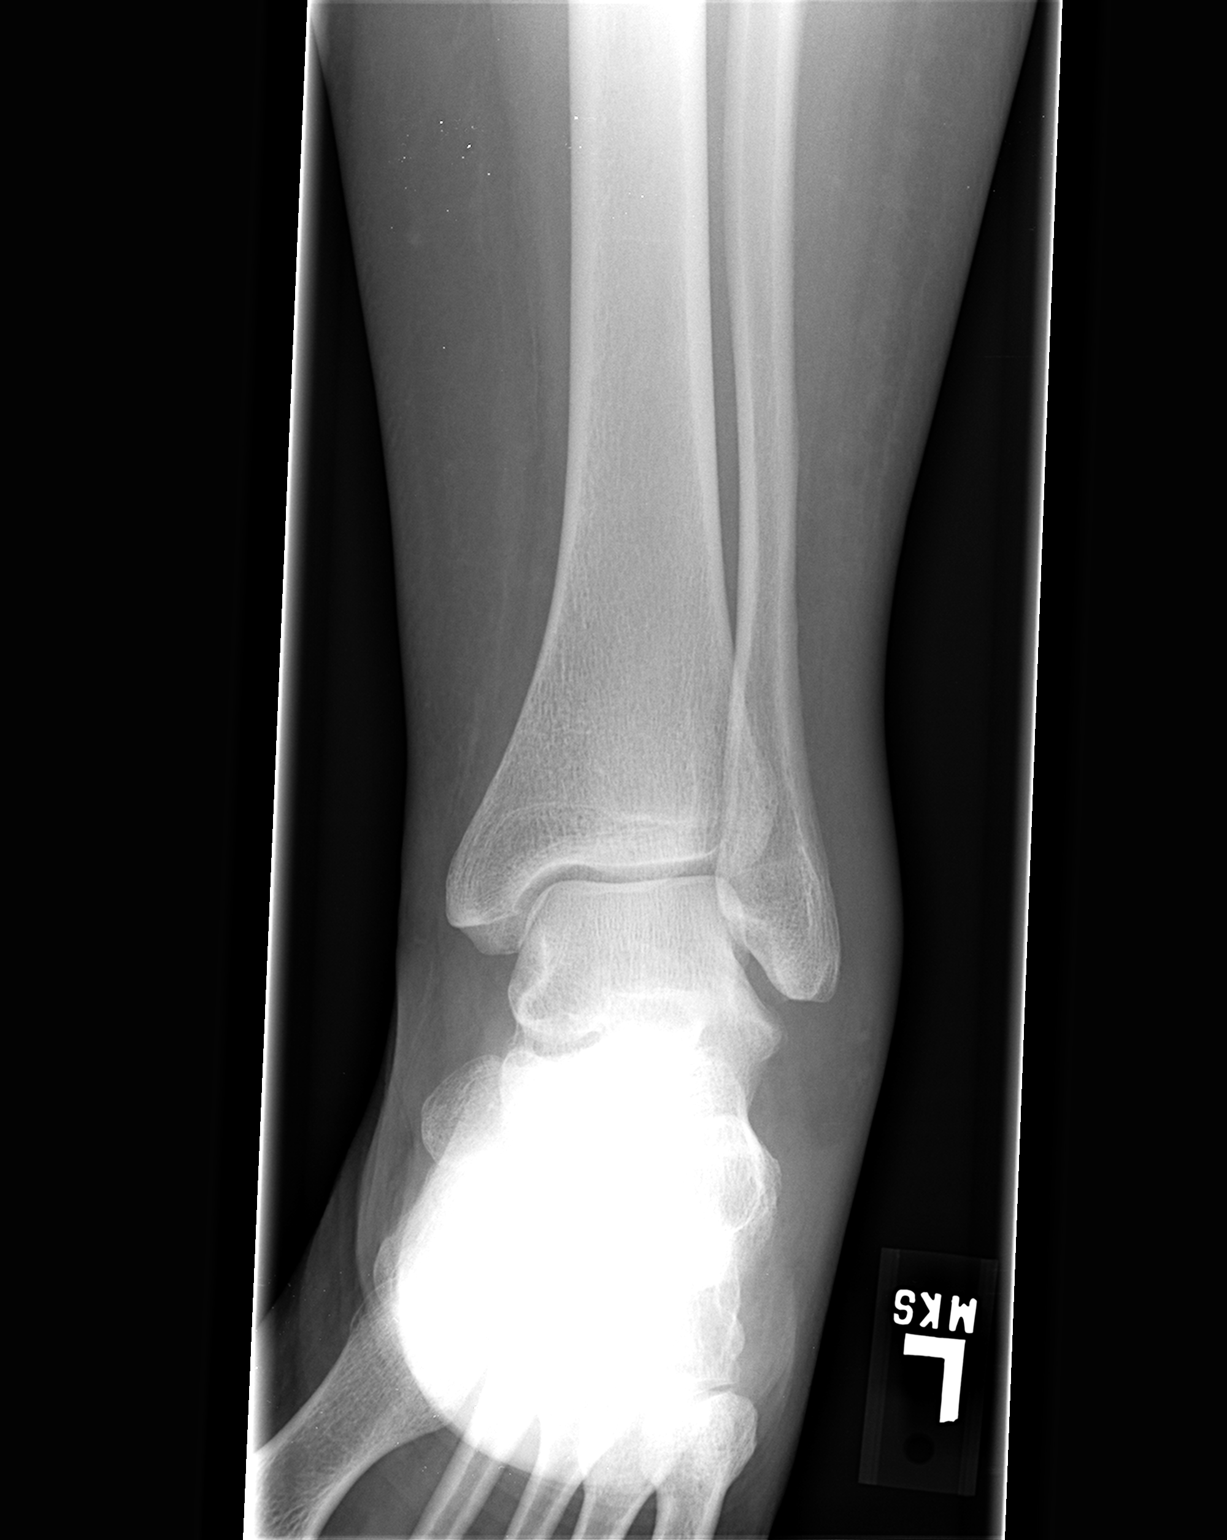

[view not recorded (2 of 3)]
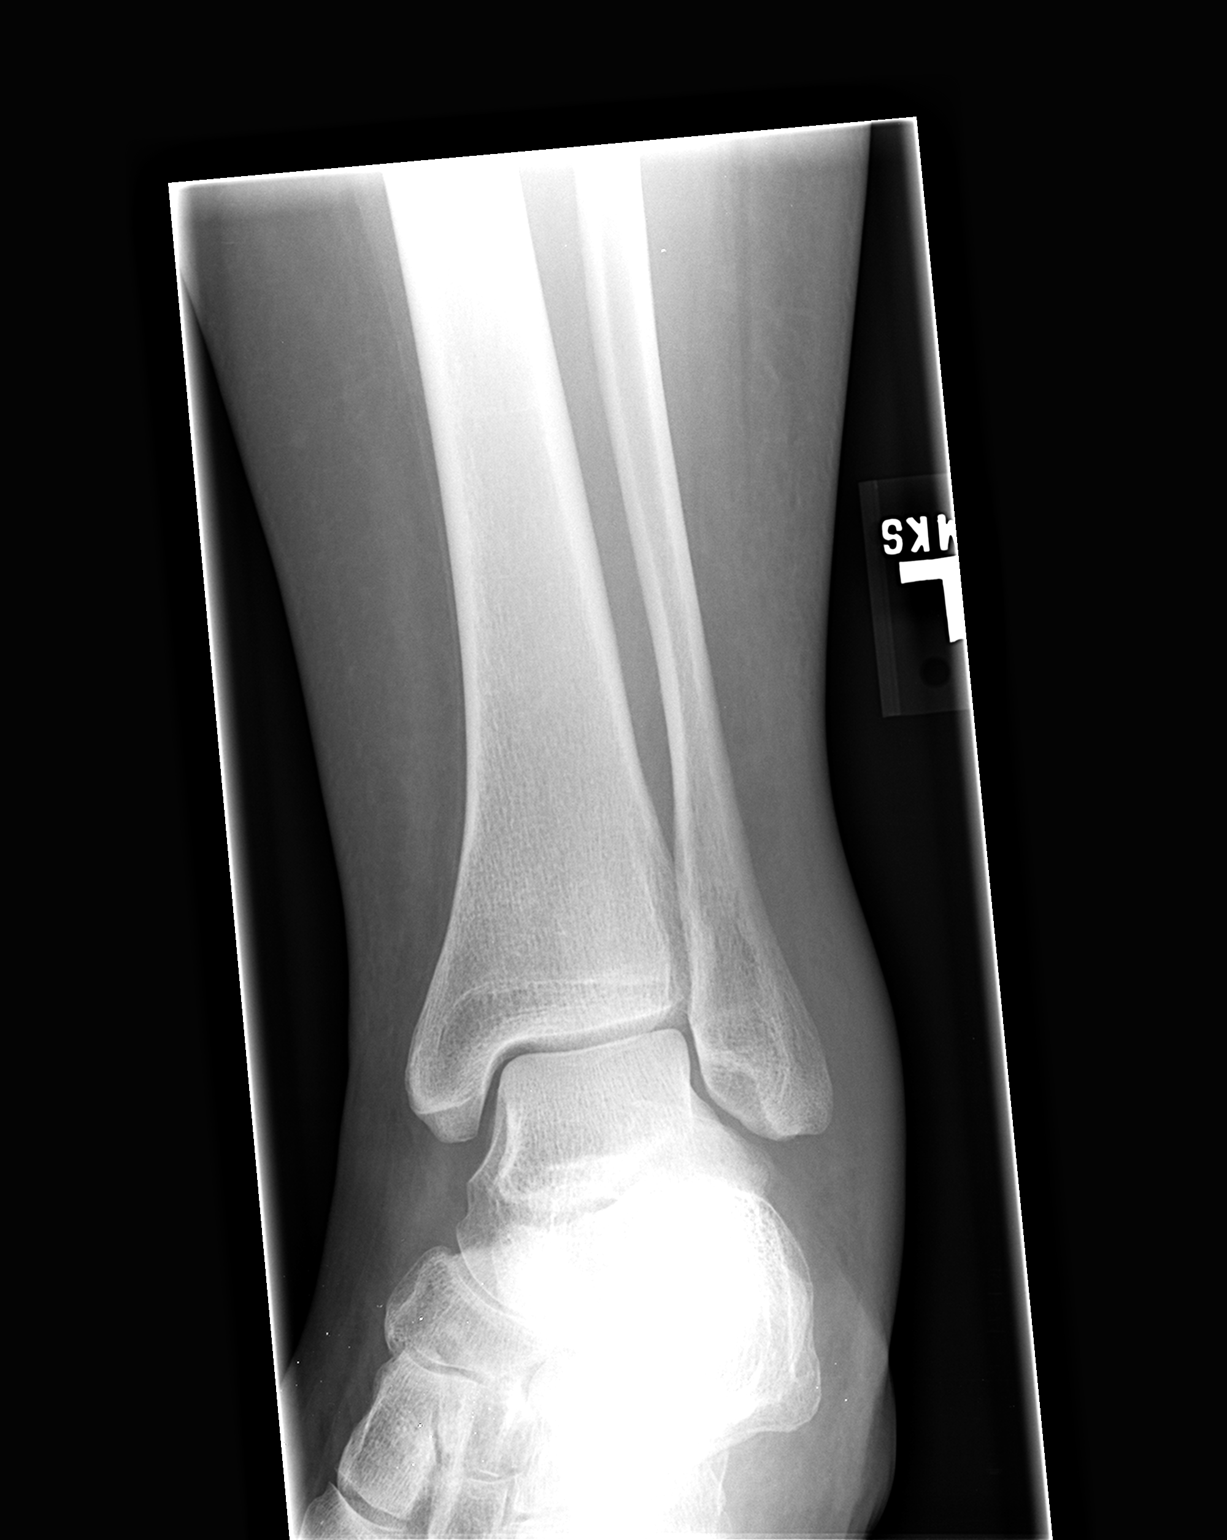

[view not recorded (3 of 3)]
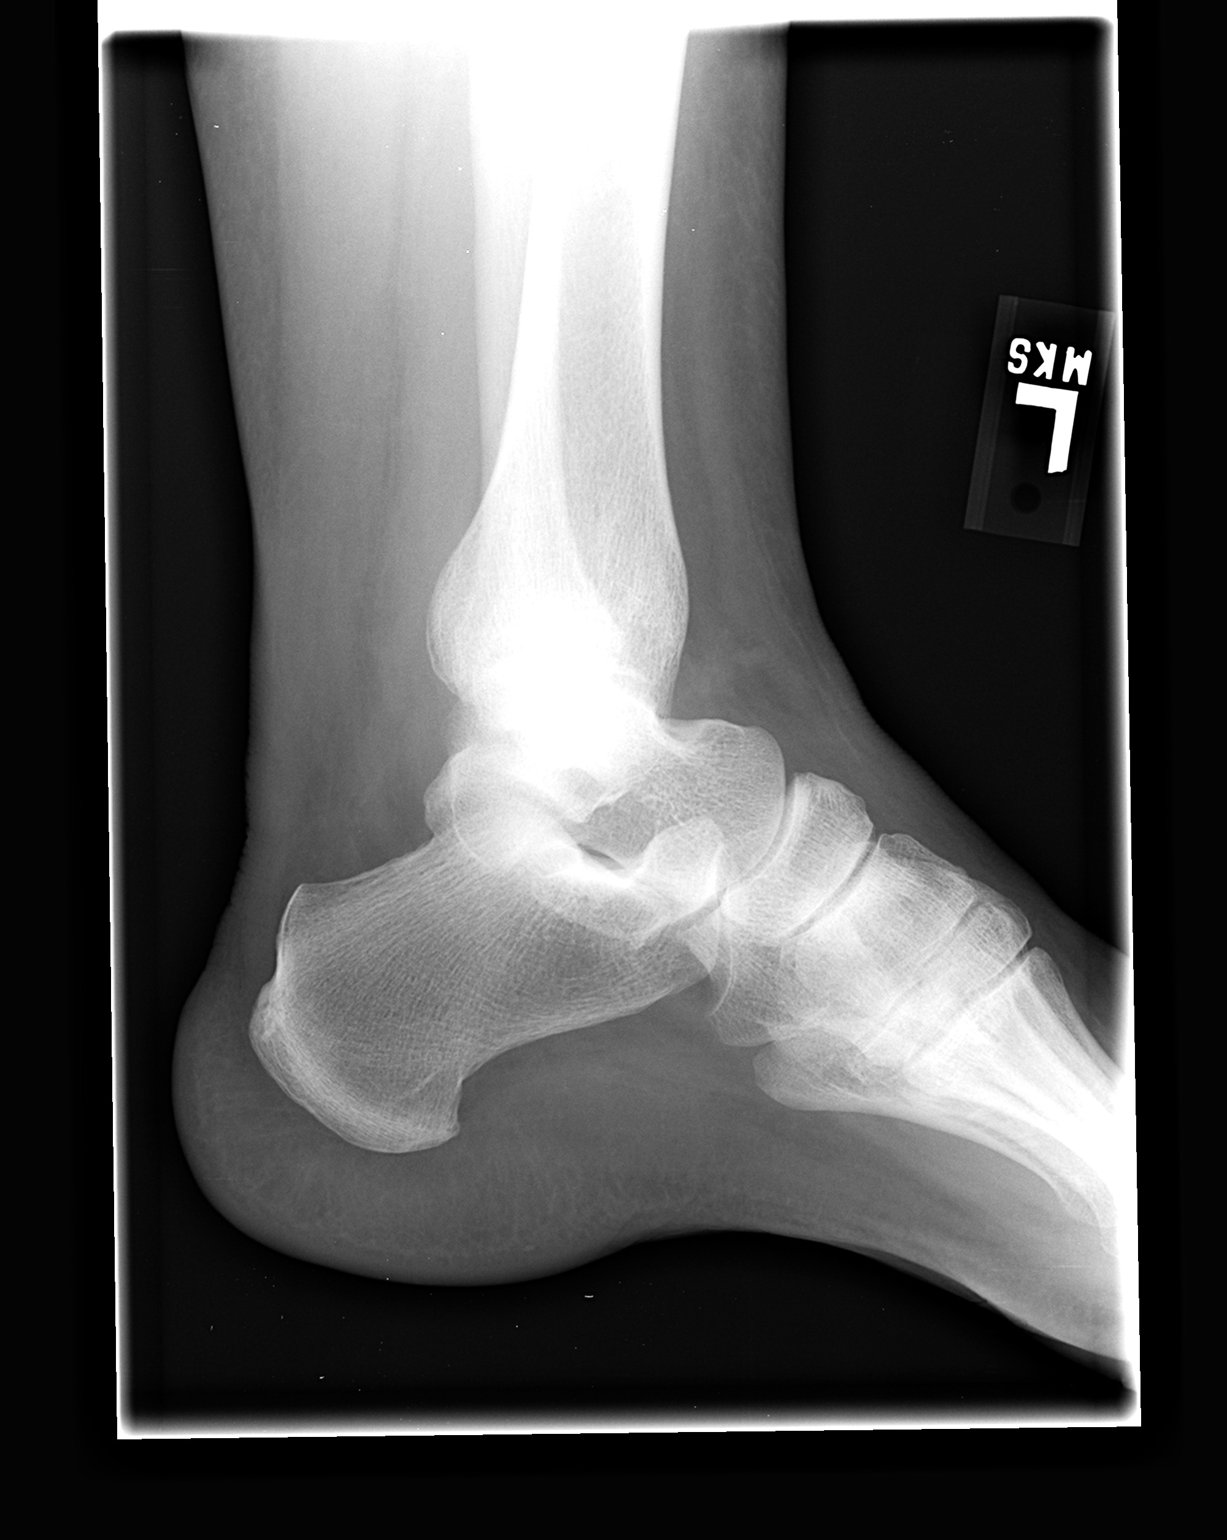

[3 of 3 positions shown; findings below may reference images not displayed]

FINDINGS: Soft tissue swelling over the lateral malleolus is
prominent.  No acute fracture and no dislocation.
IMPRESSION: No acute bony pathology.  Soft tissue swelling over the lateral
malleolus is noted.

## 2013-12-05 ENCOUNTER — Other Ambulatory Visit (HOSPITAL_COMMUNITY): Payer: Self-pay

## 2013-12-07 ENCOUNTER — Other Ambulatory Visit (HOSPITAL_COMMUNITY): Payer: BC Managed Care – PPO

## 2013-12-07 ENCOUNTER — Ambulatory Visit (HOSPITAL_COMMUNITY): Payer: BC Managed Care – PPO

## 2013-12-08 NOTE — Progress Notes (Signed)
This encounter was created in error - please disregard.

## 2013-12-26 ENCOUNTER — Encounter (HOSPITAL_COMMUNITY): Payer: Self-pay | Admitting: Emergency Medicine

## 2013-12-26 ENCOUNTER — Emergency Department (HOSPITAL_COMMUNITY): Payer: Medicare Other

## 2013-12-26 ENCOUNTER — Other Ambulatory Visit: Payer: Self-pay

## 2013-12-26 ENCOUNTER — Emergency Department (HOSPITAL_COMMUNITY)
Admission: EM | Admit: 2013-12-26 | Discharge: 2013-12-26 | Disposition: A | Discharge status: Home / Self Care | Payer: Medicare Other | Attending: Emergency Medicine | Admitting: Emergency Medicine

## 2013-12-26 DIAGNOSIS — Z87891 Personal history of nicotine dependence: Secondary | ICD-10-CM | POA: Diagnosis not present

## 2013-12-26 DIAGNOSIS — E139 Other specified diabetes mellitus without complications: Secondary | ICD-10-CM | POA: Insufficient documentation

## 2013-12-26 DIAGNOSIS — F3289 Other specified depressive episodes: Secondary | ICD-10-CM | POA: Insufficient documentation

## 2013-12-26 DIAGNOSIS — Z79899 Other long term (current) drug therapy: Secondary | ICD-10-CM | POA: Insufficient documentation

## 2013-12-26 DIAGNOSIS — Z86711 Personal history of pulmonary embolism: Secondary | ICD-10-CM | POA: Diagnosis not present

## 2013-12-26 DIAGNOSIS — J45909 Unspecified asthma, uncomplicated: Secondary | ICD-10-CM | POA: Diagnosis not present

## 2013-12-26 DIAGNOSIS — I10 Essential (primary) hypertension: Secondary | ICD-10-CM | POA: Diagnosis not present

## 2013-12-26 DIAGNOSIS — IMO0002 Reserved for concepts with insufficient information to code with codable children: Secondary | ICD-10-CM | POA: Insufficient documentation

## 2013-12-26 DIAGNOSIS — Z794 Long term (current) use of insulin: Secondary | ICD-10-CM | POA: Insufficient documentation

## 2013-12-26 DIAGNOSIS — M546 Pain in thoracic spine: Secondary | ICD-10-CM | POA: Insufficient documentation

## 2013-12-26 DIAGNOSIS — R079 Chest pain, unspecified: Secondary | ICD-10-CM | POA: Diagnosis not present

## 2013-12-26 DIAGNOSIS — Z87448 Personal history of other diseases of urinary system: Secondary | ICD-10-CM | POA: Insufficient documentation

## 2013-12-26 DIAGNOSIS — F329 Major depressive disorder, single episode, unspecified: Secondary | ICD-10-CM | POA: Insufficient documentation

## 2013-12-26 DIAGNOSIS — M549 Dorsalgia, unspecified: Secondary | ICD-10-CM

## 2013-12-26 DIAGNOSIS — D693 Immune thrombocytopenic purpura: Secondary | ICD-10-CM | POA: Insufficient documentation

## 2013-12-26 LAB — CBC WITH DIFFERENTIAL/PLATELET
BASOS ABS: 0.1 10*3/uL (ref 0.0–0.1)
Basophils Relative: 1 % (ref 0–1)
EOS ABS: 0.1 10*3/uL (ref 0.0–0.7)
Eosinophils Relative: 1 % (ref 0–5)
HEMATOCRIT: 49 % — AB (ref 36.0–46.0)
Hemoglobin: 15.8 g/dL — ABNORMAL HIGH (ref 12.0–15.0)
Lymphocytes Relative: 20 % (ref 12–46)
Lymphs Abs: 1.9 10*3/uL (ref 0.7–4.0)
MCH: 28.6 pg (ref 26.0–34.0)
MCHC: 32.2 g/dL (ref 30.0–36.0)
MCV: 88.8 fL (ref 78.0–100.0)
MONO ABS: 0.7 10*3/uL (ref 0.1–1.0)
Monocytes Relative: 7 % (ref 3–12)
NEUTROS ABS: 6.5 10*3/uL (ref 1.7–7.7)
Neutrophils Relative %: 71 % (ref 43–77)
Platelets: 149 10*3/uL — ABNORMAL LOW (ref 150–400)
RBC: 5.52 MIL/uL — ABNORMAL HIGH (ref 3.87–5.11)
RDW: 17.1 % — AB (ref 11.5–15.5)
WBC: 9.3 10*3/uL (ref 4.0–10.5)

## 2013-12-26 LAB — TROPONIN I: Troponin I: <0.3 ng/mL (ref <0.30)

## 2013-12-26 LAB — COMPREHENSIVE METABOLIC PANEL
ALK PHOS: 70 U/L (ref 39–117)
ALT: 51 U/L — ABNORMAL HIGH (ref 0–35)
AST: 38 U/L — ABNORMAL HIGH (ref 0–37)
Albumin: 3.6 g/dL (ref 3.5–5.2)
BUN: 27 mg/dL — ABNORMAL HIGH (ref 6–23)
CALCIUM: 9.7 mg/dL (ref 8.4–10.5)
CO2: 26 mEq/L (ref 19–32)
Chloride: 102 mEq/L (ref 96–112)
Creatinine, Ser: 1.52 mg/dL — ABNORMAL HIGH (ref 0.50–1.10)
GFR calc Af Amer: 46 mL/min — ABNORMAL LOW (ref >90)
GFR calc non Af Amer: 39 mL/min — ABNORMAL LOW (ref >90)
GLUCOSE: 115 mg/dL — AB (ref 70–99)
POTASSIUM: 3.8 meq/L (ref 3.7–5.3)
Sodium: 142 mEq/L (ref 137–147)
Total Bilirubin: 0.2 mg/dL — ABNORMAL LOW (ref 0.3–1.2)
Total Protein: 7.3 g/dL (ref 6.0–8.3)

## 2013-12-26 MED ORDER — ONDANSETRON 8 MG PO TBDP
8.0000 mg | ORAL_TABLET | Freq: Once | ORAL | Status: AC
  Administered 2013-12-26: 8 mg via ORAL
  Filled 2013-12-26: qty 1

## 2013-12-26 MED ORDER — CYCLOBENZAPRINE HCL 10 MG PO TABS: 10.0000 mg | ORAL_TABLET | Freq: Two times a day (BID) | ORAL | Status: DC | PRN

## 2013-12-26 MED ORDER — OXYCODONE-ACETAMINOPHEN 5-325 MG PO TABS: 2.0000 | ORAL_TABLET | ORAL | Status: DC | PRN

## 2013-12-26 MED ORDER — HYDROMORPHONE HCL PF 1 MG/ML IJ SOLN
1.0000 mg | Freq: Once | INTRAMUSCULAR | Status: AC
  Administered 2013-12-26: 1 mg via INTRAMUSCULAR
  Filled 2013-12-26: qty 1

## 2013-12-26 MED ORDER — PROMETHAZINE HCL 25 MG PO TABS: 25.0000 mg | ORAL_TABLET | Freq: Four times a day (QID) | ORAL | Status: DC | PRN

## 2013-12-26 NOTE — ED Notes (Signed)
Cough for 1 week, chest and back pain "when I sniff".  Clear sputum  No fever.   Back pain for 4 days

## 2013-12-26 NOTE — Discharge Instructions (Signed)
Alternate heat and ice. Prescriptions for pain medication, nausea medication, muscle relaxer. Consider a Velcro back support [recently placed at [place]

## 2013-12-26 NOTE — ED Provider Notes (Addendum)
CSN: [id number]     Arrival date & time 12/26/13  1514 History  This chart was scribed for [name], MD by Rolanda Lundborg, ED Scribe. This patient was seen in room APA08/APA08 and the patient's care was started at 4:49 PM.    Chief Complaint  Patient presents with  . Back Pain     (Consider location/radiation/quality/duration/timing/severity/associated sxs/prior Treatment) The history is provided by the patient. No language interpreter was used.   HPI Comments: Summer Cook is a 48 y.o. female who presents to the Emergency Department complaining of sudden-onset, moderate, right middle back pain onset 2 days ago since bending down to pick something up. She has been able to ambulate with pain. She states the pain worsens with activity. She has been taking Vicodin 2 tablets 3x per day with minimal relief. She states she cannot take ibuprofen due to ITP. Severity is mild to moderate. Positioning and palpation makes pain worse   Past Medical History  Diagnosis Date  . Essential hypertension, benign   . 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  Last was Prednisone $RemoveBefore'40mg'rDONudPNAIrHJ$ ; 2 wk in 10/2012.  She also was on Prednisone bridged with Cellcept briefly but stopped due to lack of insurance.   . Depression   . Thrush, oral 05/29/2011  . Steroid-induced diabetes   . Pulmonary embolism 04/2012  . Asthma   . Renal insufficiency    Past Surgical History  Procedure Laterality Date  . Cholecystectomy  2008  . Bone marrow biopsy    . 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 Colonoscopy with esophagogastroduodenoscopy (egd) N/A 01/04/2013    Dr. Gala Romney: esophageal plaques with +KOH, hh. Gastric antrum abnormal, bx reactive gastropathy. Anal canal hemorrhoids, colonic tics, normal TI, single polyp (sessile serrated tubular adenoma). Next TCS 12/2017   Family History  Problem Relation Age of Onset  . Cervical  cancer Mother   . Lung cancer Father   . Pneumonia Brother   . Cancer Paternal Grandmother 72    colon cancer, breast cancer  . Cancer Paternal Grandfather 56    colon cancer  . AAA (abdominal aortic aneurysm) Paternal Grandmother    History  Substance Use Topics  . Smoking status: Former Smoker -- 0 years    Types: Cigarettes    Quit date: 02/14/2009  . Smokeless tobacco: Never Used  . Alcohol Use: No   OB History   Grav Para Term Preterm Abortions TAB SAB Ect Mult Living                 Review of Systems  Musculoskeletal: Positive for back pain.   A complete 10 system review of systems was obtained and all systems are negative except as noted in the HPI and PMH.     Allergies  Yellow jacket venom  Home Medications   Current Outpatient Rx  Name  Route  Sig  Dispense  Refill  . albuterol (PROVENTIL HFA;VENTOLIN HFA) 108 (90 BASE) MCG/ACT inhaler   Inhalation   Inhale 2 puffs into the lungs every 6 (six) hours as needed for wheezing or shortness of breath.          . ALPRAZolam (XANAX) 1 MG tablet   Oral   Take 1 mg by mouth 3 (three) times daily as needed for anxiety.          . Cholecalciferol (VITAMIN D) 2000 UNITS CAPS   Oral   Take  1 capsule by mouth daily.         . cyclobenzaprine (FLEXERIL) 10 MG tablet   Oral   Take 1 tablet (10 mg total) by mouth 2 (two) times daily as needed for muscle spasms.   20 tablet   0   . dexamethasone (DECADRON) 0.5 MG tablet   Oral   Take 0.5 mg by mouth 2 (two) times daily.          . furosemide (LASIX) 20 MG tablet   Oral   Take 20 mg by mouth daily.         Marland Kitchen HYDROcodone-acetaminophen (NORCO) 10-325 MG per tablet   Oral   Take 1 tablet by mouth every 6 (six) hours as needed for pain.          Marland Kitchen insulin aspart (NOVOLOG) 100 UNIT/ML injection   Subcutaneous   Inject into the skin 3 (three) times daily before meals. SSI         . ipratropium-albuterol (DUONEB) 0.5-2.5 (3) MG/3ML SOLN      3 mLs  as needed.          . metoprolol succinate (TOPROL-XL) 50 MG 24 hr tablet      TAKE 1 TABLET EVERY DAY   90 tablet   3   . Multiple Vitamins-Minerals (MULTIVITAMIN PO)   Oral   Take 1 capsule by mouth 3 (three) times daily. 3/day capsules         . ondansetron (ZOFRAN) 8 MG tablet   Oral   Take 1 tablet (8 mg total) by mouth every 8 (eight) hours as needed for nausea.   20 tablet   0   . oxyCODONE-acetaminophen (PERCOCET) 5-325 MG per tablet   Oral   Take 2 tablets by mouth every 4 (four) hours as needed.   20 tablet   0   . predniSONE (DELTASONE) 10 MG tablet   Oral   Take 1 tablet (10 mg total) by mouth daily.   60 tablet   3     Alternate $RemoveBe'20mg'QwqropMxn$  po q day with $Remove'10mg'AQKRnzB$  po q day for the ...   . promethazine (PHENERGAN) 25 MG tablet   Oral   Take 1 tablet (25 mg total) by mouth every 6 (six) hours as needed for nausea or vomiting.   20 tablet   0   . sertraline (ZOLOFT) 50 MG tablet   Oral   Take 50 mg by mouth 3 (three) times daily.          Marland Kitchen triamterene-hydrochlorothiazide (DYAZIDE) 37.5-25 MG per capsule   Oral   Take 1 capsule by mouth every morning.          . vitamin B-12 (CYANOCOBALAMIN) 1000 MCG tablet   Oral   Take 1,000 mcg by mouth daily.          BP 119/88  Pulse 90  Temp(Src) 97.8 F (36.6 C) (Oral)  Resp 18  Ht $R'5\' 5"'mn$  (1.651 m)  Wt 223 lb (101.152 kg)  BMI 37.11 kg/m2  SpO2 92%  LMP 05/13/2011 Physical Exam  Nursing note and vitals reviewed. Constitutional: She is oriented to person, place, and time. She appears well-developed and well-nourished.  HENT:  Head: Normocephalic and atraumatic.  Eyes: Conjunctivae and EOM are normal. Pupils are equal, round, and reactive to light.  Neck: Normal range of motion. Neck supple.  Cardiovascular: Normal rate, regular rhythm and normal heart sounds.   Pulmonary/Chest: Effort normal and breath sounds normal.  Abdominal: Soft. Bowel  sounds are normal.  Musculoskeletal: Normal range of motion.   T10-T11-T12 area there is Paraspinous tenderness  Neurological: She is alert and oriented to person, place, and time.  Skin: Skin is warm and dry.  Psychiatric: She has a normal mood and affect. Her behavior is normal.    ED Course  Procedures (including critical care time) Medications  ondansetron (ZOFRAN-ODT) disintegrating tablet 8 mg (8 mg Oral Given 12/26/13 1740)  HYDROmorphone (DILAUDID) injection 1 mg (1 mg Intramuscular Given 12/26/13 1741)    DIAGNOSTIC STUDIES: Oxygen Saturation is 92% on RA, adequate by my interpretation.    COORDINATION OF CARE: 5:23 PM- Discussed treatment plan with pt which includes pain medication and muscle relaxer. Pt agrees to plan.    Labs Review Labs Reviewed  CBC WITH DIFFERENTIAL - Abnormal; Notable for the following:    RBC 5.52 (*)    Hemoglobin 15.8 (*)    HCT 49.0 (*)    RDW 17.1 (*)    Platelets 149 (*)    All other components within normal limits  COMPREHENSIVE METABOLIC PANEL - Abnormal; Notable for the following:    Glucose, Bld 115 (*)    BUN 27 (*)    Creatinine, Ser 1.52 (*)    AST 38 (*)    ALT 51 (*)    Total Bilirubin 0.2 (*)    GFR calc non Af Amer 39 (*)    GFR calc Af Amer 46 (*)    All other components within normal limits  TROPONIN I   Imaging Review Dg Chest 2 View  12/26/2013   CLINICAL DATA:  Chest pain  EXAM: CHEST  2 VIEW  COMPARISON:  05/23/2013  FINDINGS: Cardiac enlargement. Normal vascularity. Lungs are clear without infiltrate or mass. Chronic compression fracture approximately L1 is unchanged.  IMPRESSION: No active cardiopulmonary disease.   Electronically Signed   By: Franchot Gallo M.D.   On: 12/26/2013 15:57     EKG Interpretation None      Date: 12/26/2013  Rate: 87  Rhythm: normal sinus rhythm  QRS Axis: right  Intervals: normal  ST/T Wave abnormalities: normal  Conduction Disutrbances: none  Narrative Interpretation: unremarkable    MDM   Final diagnoses:  Back pain      History and physical consistent with muscular pain. No imaging necessary. Rx Percocet, Flexeril 10 mg, Phenergan 25 mg I personally performed the services described in this documentation, which was scribed in my presence. The recorded information has been reviewed and is accurate.    [name], MD 12/26/13 [place], MD 01/01/14 2042

## 2014-01-01 ENCOUNTER — Emergency Department (HOSPITAL_COMMUNITY)
Admission: EM | Admit: 2014-01-01 | Discharge: 2014-01-01 | Discharge status: Against Medical Advice | Payer: Medicare Other | Attending: Emergency Medicine | Admitting: Emergency Medicine

## 2014-01-01 DIAGNOSIS — E139 Other specified diabetes mellitus without complications: Secondary | ICD-10-CM | POA: Diagnosis not present

## 2014-01-01 DIAGNOSIS — Z87891 Personal history of nicotine dependence: Secondary | ICD-10-CM | POA: Diagnosis not present

## 2014-01-01 DIAGNOSIS — J45909 Unspecified asthma, uncomplicated: Secondary | ICD-10-CM | POA: Diagnosis not present

## 2014-01-01 DIAGNOSIS — I10 Essential (primary) hypertension: Secondary | ICD-10-CM | POA: Diagnosis not present

## 2014-01-01 DIAGNOSIS — M549 Dorsalgia, unspecified: Secondary | ICD-10-CM | POA: Insufficient documentation

## 2014-01-01 NOTE — ED Notes (Signed)
Pt not in waiting area when called x 1

## 2014-01-01 NOTE — ED Notes (Signed)
Attempted to call x3, pt not in waiting room

## 2014-01-01 NOTE — ED Notes (Signed)
Pt not in waiting area when called x 2

## 2014-01-04 ENCOUNTER — Emergency Department (HOSPITAL_COMMUNITY)
Admission: EM | Admit: 2014-01-04 | Discharge: 2014-01-04 | Disposition: A | Discharge status: Home / Self Care | Payer: Medicare Other | Attending: Emergency Medicine | Admitting: Emergency Medicine

## 2014-01-04 ENCOUNTER — Encounter (HOSPITAL_COMMUNITY): Payer: Self-pay | Admitting: Emergency Medicine

## 2014-01-04 ENCOUNTER — Emergency Department (HOSPITAL_COMMUNITY): Payer: Medicare Other

## 2014-01-04 DIAGNOSIS — Z87448 Personal history of other diseases of urinary system: Secondary | ICD-10-CM | POA: Diagnosis not present

## 2014-01-04 DIAGNOSIS — X500XXA Overexertion from strenuous movement or load, initial encounter: Secondary | ICD-10-CM | POA: Insufficient documentation

## 2014-01-04 DIAGNOSIS — F3289 Other specified depressive episodes: Secondary | ICD-10-CM | POA: Insufficient documentation

## 2014-01-04 DIAGNOSIS — F329 Major depressive disorder, single episode, unspecified: Secondary | ICD-10-CM | POA: Diagnosis not present

## 2014-01-04 DIAGNOSIS — J189 Pneumonia, unspecified organism: Secondary | ICD-10-CM | POA: Diagnosis not present

## 2014-01-04 DIAGNOSIS — S22009A Unspecified fracture of unspecified thoracic vertebra, initial encounter for closed fracture: Secondary | ICD-10-CM | POA: Insufficient documentation

## 2014-01-04 DIAGNOSIS — Z86711 Personal history of pulmonary embolism: Secondary | ICD-10-CM | POA: Diagnosis not present

## 2014-01-04 DIAGNOSIS — I10 Essential (primary) hypertension: Secondary | ICD-10-CM | POA: Diagnosis not present

## 2014-01-04 DIAGNOSIS — R000 Tachycardia, unspecified: Secondary | ICD-10-CM | POA: Insufficient documentation

## 2014-01-04 DIAGNOSIS — E119 Type 2 diabetes mellitus without complications: Secondary | ICD-10-CM | POA: Diagnosis not present

## 2014-01-04 DIAGNOSIS — Z8619 Personal history of other infectious and parasitic diseases: Secondary | ICD-10-CM | POA: Insufficient documentation

## 2014-01-04 DIAGNOSIS — Z87891 Personal history of nicotine dependence: Secondary | ICD-10-CM | POA: Insufficient documentation

## 2014-01-04 DIAGNOSIS — J159 Unspecified bacterial pneumonia: Secondary | ICD-10-CM | POA: Insufficient documentation

## 2014-01-04 DIAGNOSIS — Z794 Long term (current) use of insulin: Secondary | ICD-10-CM | POA: Diagnosis not present

## 2014-01-04 DIAGNOSIS — J45901 Unspecified asthma with (acute) exacerbation: Secondary | ICD-10-CM | POA: Insufficient documentation

## 2014-01-04 DIAGNOSIS — J9819 Other pulmonary collapse: Secondary | ICD-10-CM | POA: Diagnosis not present

## 2014-01-04 DIAGNOSIS — Z79899 Other long term (current) drug therapy: Secondary | ICD-10-CM | POA: Diagnosis not present

## 2014-01-04 DIAGNOSIS — IMO0002 Reserved for concepts with insufficient information to code with codable children: Secondary | ICD-10-CM | POA: Diagnosis not present

## 2014-01-04 DIAGNOSIS — Y929 Unspecified place or not applicable: Secondary | ICD-10-CM | POA: Insufficient documentation

## 2014-01-04 DIAGNOSIS — M5137 Other intervertebral disc degeneration, lumbosacral region: Secondary | ICD-10-CM | POA: Diagnosis not present

## 2014-01-04 DIAGNOSIS — M549 Dorsalgia, unspecified: Secondary | ICD-10-CM | POA: Diagnosis not present

## 2014-01-04 DIAGNOSIS — R002 Palpitations: Secondary | ICD-10-CM | POA: Insufficient documentation

## 2014-01-04 DIAGNOSIS — S22000A Wedge compression fracture of unspecified thoracic vertebra, initial encounter for closed fracture: Secondary | ICD-10-CM

## 2014-01-04 DIAGNOSIS — Y9389 Activity, other specified: Secondary | ICD-10-CM | POA: Insufficient documentation

## 2014-01-04 LAB — CBC WITH DIFFERENTIAL/PLATELET
Basophils Absolute: 0 10*3/uL (ref 0.0–0.1)
Basophils Relative: 0 % (ref 0–1)
EOS ABS: 0.1 10*3/uL (ref 0.0–0.7)
Eosinophils Relative: 0 % (ref 0–5)
HEMATOCRIT: 46.7 % — AB (ref 36.0–46.0)
Hemoglobin: 15.1 g/dL — ABNORMAL HIGH (ref 12.0–15.0)
Lymphocytes Relative: 16 % (ref 12–46)
Lymphs Abs: 2.2 10*3/uL (ref 0.7–4.0)
MCH: 28.7 pg (ref 26.0–34.0)
MCHC: 32.3 g/dL (ref 30.0–36.0)
MCV: 88.6 fL (ref 78.0–100.0)
MONO ABS: 1.1 10*3/uL — AB (ref 0.1–1.0)
MONOS PCT: 9 % (ref 3–12)
Neutro Abs: 10 10*3/uL — ABNORMAL HIGH (ref 1.7–7.7)
Neutrophils Relative %: 75 % (ref 43–77)
Platelets: 217 10*3/uL (ref 150–400)
RBC: 5.27 MIL/uL — ABNORMAL HIGH (ref 3.87–5.11)
RDW: 17.1 % — ABNORMAL HIGH (ref 11.5–15.5)
WBC: 13.4 10*3/uL — ABNORMAL HIGH (ref 4.0–10.5)

## 2014-01-04 LAB — URINALYSIS, ROUTINE W REFLEX MICROSCOPIC
Bilirubin Urine: NEGATIVE
Glucose, UA: NEGATIVE mg/dL
KETONES UR: NEGATIVE mg/dL
Leukocytes, UA: NEGATIVE
NITRITE: NEGATIVE
Protein, ur: NEGATIVE mg/dL
SPECIFIC GRAVITY, URINE: 1.01 (ref 1.005–1.030)
UROBILINOGEN UA: 0.2 mg/dL (ref 0.0–1.0)
pH: 5.5 (ref 5.0–8.0)

## 2014-01-04 LAB — URINE MICROSCOPIC-ADD ON

## 2014-01-04 LAB — COMPREHENSIVE METABOLIC PANEL
ALK PHOS: 102 U/L (ref 39–117)
ALT: 54 U/L — ABNORMAL HIGH (ref 0–35)
AST: 22 U/L (ref 0–37)
Albumin: 3.4 g/dL — ABNORMAL LOW (ref 3.5–5.2)
BUN: 34 mg/dL — AB (ref 6–23)
CO2: 24 meq/L (ref 19–32)
Calcium: 9.7 mg/dL (ref 8.4–10.5)
Chloride: 100 mEq/L (ref 96–112)
Creatinine, Ser: 1.29 mg/dL — ABNORMAL HIGH (ref 0.50–1.10)
GFR, EST AFRICAN AMERICAN: 56 mL/min — AB (ref >90)
GFR, EST NON AFRICAN AMERICAN: 48 mL/min — AB (ref >90)
GLUCOSE: 198 mg/dL — AB (ref 70–99)
Potassium: 3.1 mEq/L — ABNORMAL LOW (ref 3.7–5.3)
Sodium: 142 mEq/L (ref 137–147)
TOTAL PROTEIN: 7.1 g/dL (ref 6.0–8.3)
Total Bilirubin: 0.4 mg/dL (ref 0.3–1.2)

## 2014-01-04 IMAGING — CR DG CHEST 2V
2 series · 2 of 2 positions shown · non-contrast
Comparison: 05/02/2012 and earlier. Lumbar radiographs 07/04/2012.

CLINICAL DATA: 46-year-old female chest pain.  Pulmonary emboli in
[REDACTED].

CHEST - 2 VIEW

[view not recorded (1 of 2)]
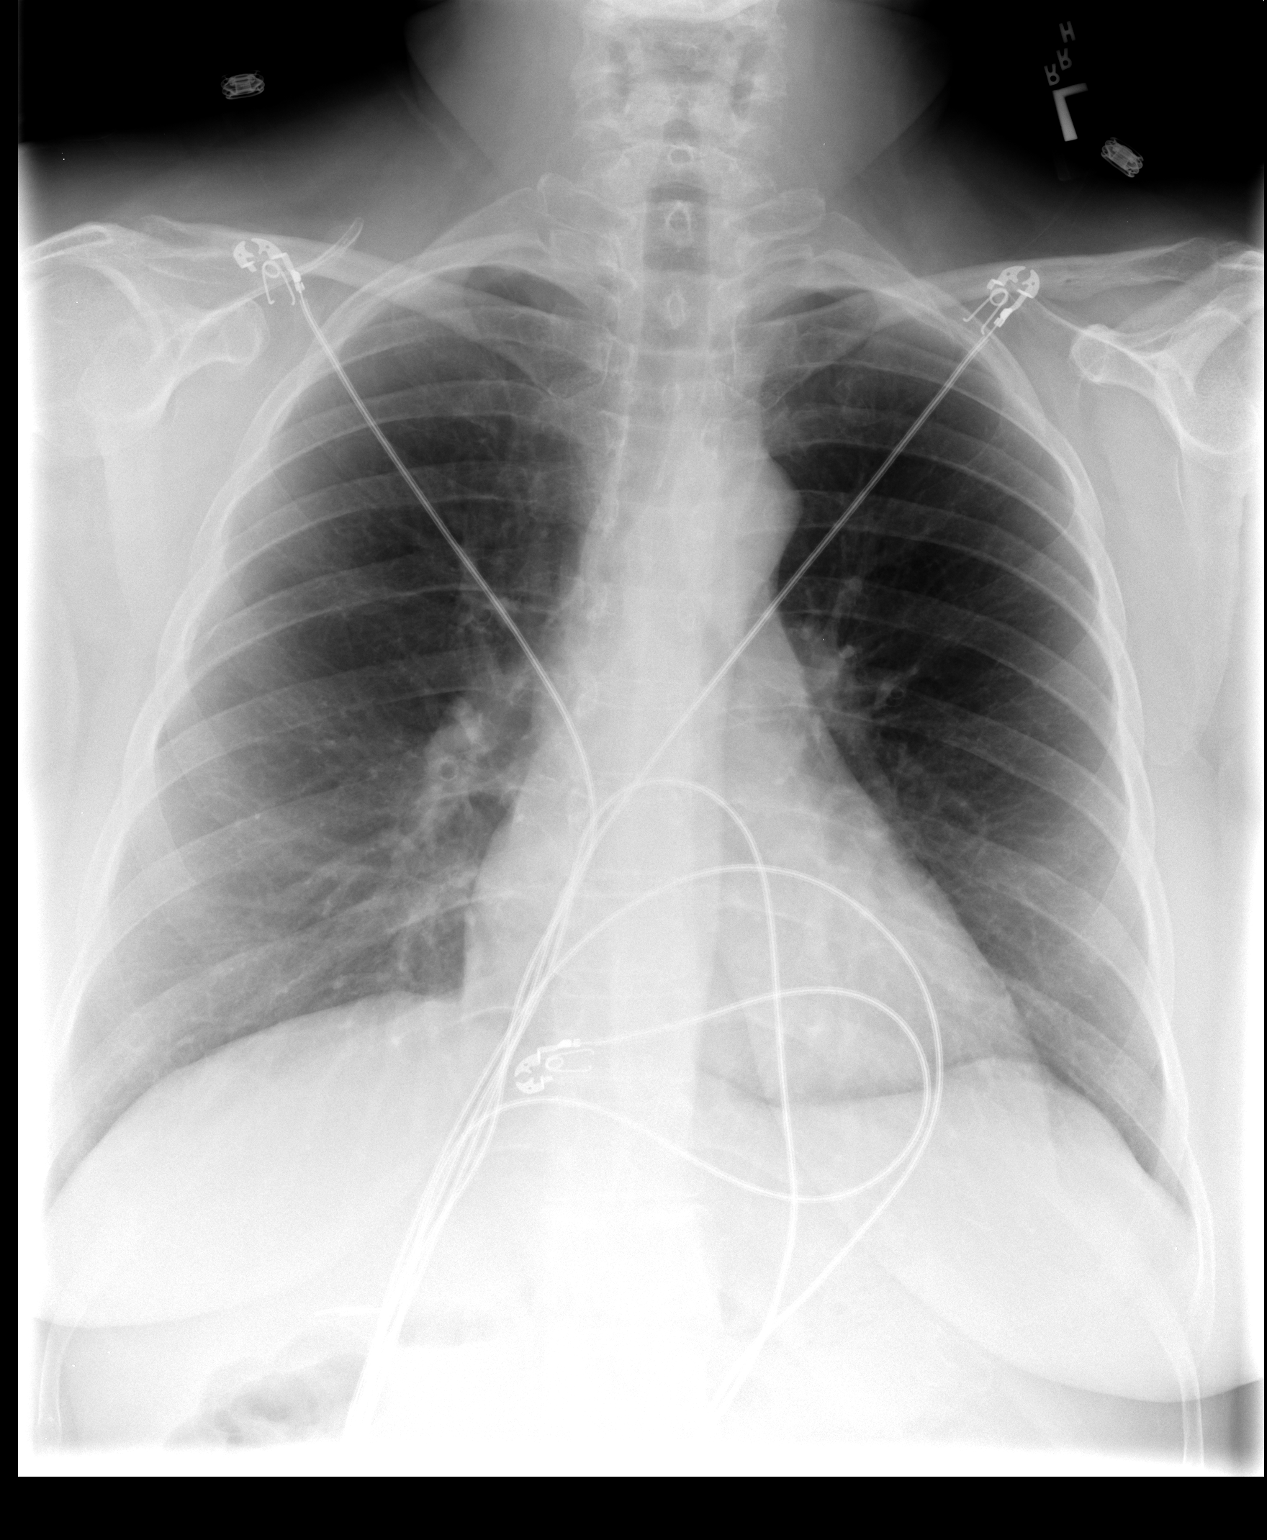

[view not recorded (2 of 2)]
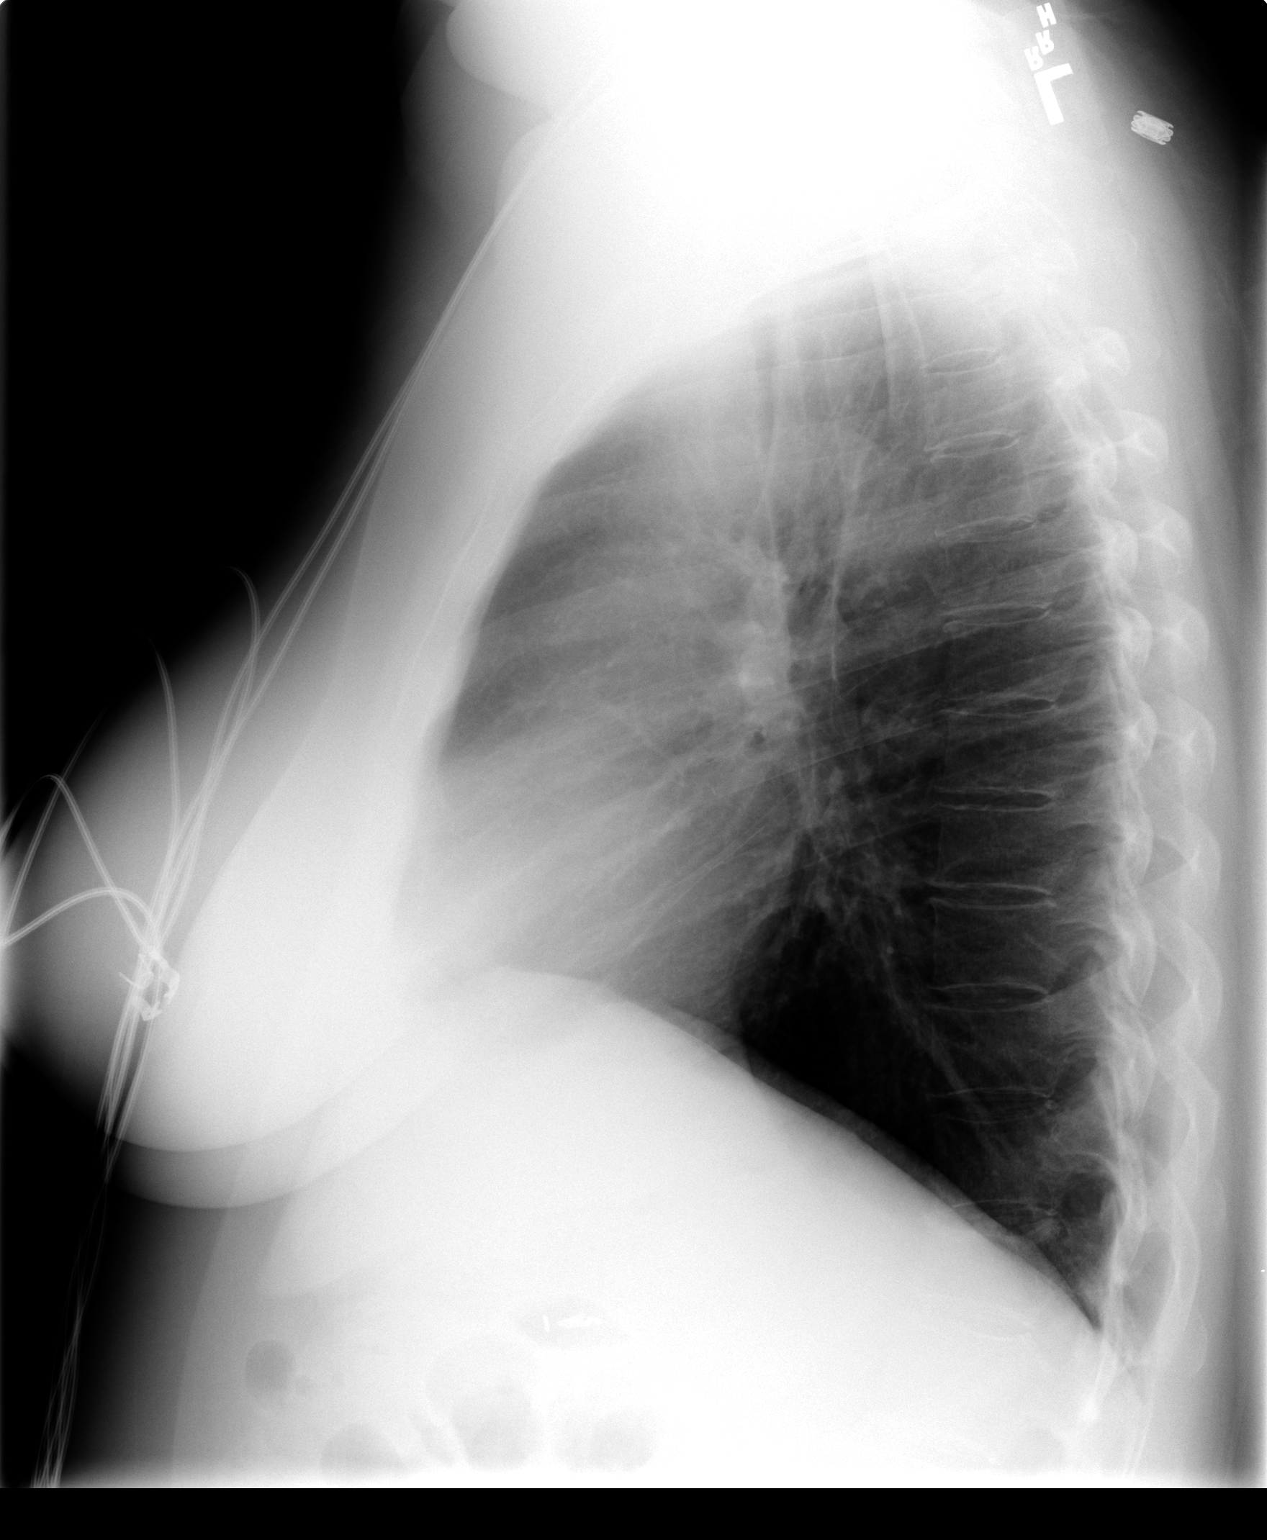

[2 of 2 positions shown; findings below may reference images not displayed]

FINDINGS: Lung volumes are stable within normal limits.  Cardiac
size and mediastinal contours are within normal limits.  Visualized
tracheal air column is within normal limits.  No pneumothorax,
pulmonary edema, pleural effusion or acute pulmonary opacity.
Stable right upper quadrant surgical clips.
Mild T12 compression deformities stable since 07/04/2012.
IMPRESSION: No acute cardiopulmonary abnormality.

## 2014-01-04 MED ORDER — LORAZEPAM 2 MG/ML IJ SOLN
1.0000 mg | Freq: Once | INTRAMUSCULAR | Status: AC
  Administered 2014-01-04: 1 mg via INTRAVENOUS
  Filled 2014-01-04: qty 1

## 2014-01-04 MED ORDER — IOHEXOL 300 MG/ML  SOLN
100.0000 mL | Freq: Once | INTRAMUSCULAR | Status: AC | PRN
  Administered 2014-01-04: 80 mL via INTRAVENOUS

## 2014-01-04 MED ORDER — AZITHROMYCIN 250 MG PO TABS: 250.0000 mg | ORAL_TABLET | Freq: Every day | ORAL | Status: DC

## 2014-01-04 MED ORDER — HYDROMORPHONE HCL PF 1 MG/ML IJ SOLN
1.0000 mg | Freq: Once | INTRAMUSCULAR | Status: AC
  Administered 2014-01-04: 1 mg via INTRAVENOUS
  Filled 2014-01-04: qty 1

## 2014-01-04 MED ORDER — DEXTROSE 5 % IV SOLN
1.0000 g | Freq: Once | INTRAVENOUS | Status: AC
  Administered 2014-01-04: 1 g via INTRAVENOUS
  Filled 2014-01-04: qty 10

## 2014-01-04 MED ORDER — POTASSIUM CHLORIDE CRYS ER 20 MEQ PO TBCR
40.0000 meq | EXTENDED_RELEASE_TABLET | Freq: Once | ORAL | Status: AC
  Administered 2014-01-04: 40 meq via ORAL
  Filled 2014-01-04: qty 2

## 2014-01-04 MED ORDER — OXYCODONE-ACETAMINOPHEN 5-325 MG PO TABS: 1.0000 | ORAL_TABLET | ORAL | Status: DC | PRN

## 2014-01-04 MED ORDER — KETOROLAC TROMETHAMINE 30 MG/ML IJ SOLN
30.0000 mg | Freq: Once | INTRAMUSCULAR | Status: AC
  Administered 2014-01-04: 30 mg via INTRAVENOUS
  Filled 2014-01-04: qty 1

## 2014-01-04 MED ORDER — SODIUM CHLORIDE 0.9 % IV BOLUS (SEPSIS)
500.0000 mL | Freq: Once | INTRAVENOUS | Status: AC
  Administered 2014-01-04: 500 mL via INTRAVENOUS

## 2014-01-04 MED ORDER — ONDANSETRON HCL 4 MG/2ML IJ SOLN
4.0000 mg | Freq: Once | INTRAMUSCULAR | Status: AC
  Administered 2014-01-04: 4 mg via INTRAVENOUS
  Filled 2014-01-04: qty 2

## 2014-01-04 NOTE — ED Notes (Signed)
Pt c/o upper  Back pain x 2 weeks.  Reports was seen her x 2 for same and was told was a muscle spasm.

## 2014-01-04 NOTE — Discharge Instructions (Signed)
Back, Compression Fracture °A compression fracture happens when a force is put upon the length of your spine. Slipping and falling on your bottom are examples of such a force. When this happens, sometimes the force is great enough to compress the building blocks (vertebral bodies) of your spine. Although this causes a lot of pain, this can usually be treated at home, unless your caregiver feels hospitalization is needed for pain control. °Your backbone (spinal column) is made up of 24 main vertebral bodies in addition to the sacrum and coccyx (see illustration). These are held together by tough fibrous tissues (ligaments) and by support of your muscles. Nerve roots pass through the openings between the vertebrae. A sudden wrenching move, injury, or a fall may cause a compression fracture of one of the vertebral bodies. This may result in back pain or spread of pain into the belly (abdomen), the buttocks, and down the leg into the foot. Pain may also be created by muscle spasm alone. °Large studies have been undertaken to determine the best possible course of action to help your back following injury and also to prevent future problems. The recommendations are as follows. °FOLLOWING A COMPRESSION FRACTURE: °Do the following only if advised by your caregiver.  °· If a back brace has been suggested or provided, wear it as directed. °· DO NOT stop wearing the back brace unless instructed by your caregiver. °· When allowed to return to regular activities, avoid a sedentary life style. Actively exercise. Sporadic weekend binges of tennis, racquetball, water skiing, may actually aggravate or create problems, especially if you are not in condition for that activity. °· Avoid sports requiring sudden body movements until you are in condition for them. Swimming and walking are safer activities. °· Maintain good posture. °· Avoid obesity. °· If not already done, you should have a DEXA scan. Based on the results, be treated for  osteoporosis. °FOLLOWING ACUTE (SUDDEN) INJURY: °· Only take over-the-counter or prescription medicines for pain, discomfort, or fever as directed by your caregiver. °· Use bed rest for only the most extreme acute episode. Prolonged bed rest may aggravate your condition. Ice used for acute conditions is effective. Use a large plastic bag filled with ice. Wrap it in a towel. This also provides excellent pain relief. This may be continuous. Or use it for 30 minutes every 2 hours during acute phase, then as needed. Heat for 30 minutes prior to activities is helpful. °· As soon as the acute phase (the time when your back is too painful for you to do normal activities) is over, it is important to resume normal activities and work hardening programs. Back injuries can cause potentially marked changes in lifestyle. So it is important to attack these problems aggressively. °· See your caregiver for continued problems. He or she can help or refer you for appropriate exercises, physical therapy and work hardening if needed. °· If you are given narcotic medications for your condition, for the next 24 hours DO NOT: °· Drive °· Operate machinery or power tools. °· Sign legal documents. °· DO NOT drink alcohol, take sleeping pills or other medications that may interfere with treatment. °If your caregiver has given you a follow-up appointment, it is very important to keep that appointment. Not keeping the appointment could result in a chronic or permanent injury, pain, and disability. If there is any problem keeping the appointment, you must call back to this facility for assistance.  °SEEK IMMEDIATE MEDICAL CARE IF: °· You develop numbness,   tingling, weakness, or problems with the use of your arms or legs.  You develop severe back pain not relieved with medications.  You have changes in bowel or bladder control.  You have increasing pain in any areas of the body. Document Released: 09/14/2005 Document Revised: 12/07/2011  Document Reviewed: 04/18/2008  [facility] Patient Information 2014 [place].  Pneumonia, Adult Pneumonia is an infection of the lungs.  CAUSES Pneumonia may be caused by bacteria or a virus. Usually, these infections are caused by breathing infectious particles into the lungs (respiratory tract). SYMPTOMS   Cough.  Fever.  Chest pain.  Increased rate of breathing.  Wheezing.  Mucus production. DIAGNOSIS  If you have the common symptoms of pneumonia, your caregiver will typically confirm the diagnosis with a chest X-ray. The X-ray will show an abnormality in the lung (pulmonary infiltrate) if you have pneumonia. Other tests of your blood, urine, or sputum may be done to find the specific cause of your pneumonia. Your caregiver may also do tests (blood gases or pulse oximetry) to see how well your lungs are working. TREATMENT  Some forms of pneumonia may be spread to other people when you cough or sneeze. You may be asked to wear a mask before and during your exam. Pneumonia that is caused by bacteria is treated with antibiotic medicine. Pneumonia that is caused by the influenza virus may be treated with an antiviral medicine. Most other viral infections must run their course. These infections will not respond to antibiotics.  PREVENTION A pneumococcal shot (vaccine) is available to prevent a common bacterial cause of pneumonia. This is usually suggested for:  People over 51 years old.  Patients on chemotherapy.  People with chronic lung problems, such as bronchitis or emphysema.  People with immune system problems. If you are over 65 or have a high risk condition, you may receive the pneumococcal vaccine if you have not received it before. In some countries, a routine influenza vaccine is also recommended. This vaccine can help prevent some cases of pneumonia.You may be offered the influenza vaccine as part of your care. If you smoke, it is time to quit. You may receive  instructions on how to stop smoking. Your caregiver can provide medicines and counseling to help you quit. HOME CARE INSTRUCTIONS   Cough suppressants may be used if you are losing too much rest. However, coughing protects you by clearing your lungs. You should avoid using cough suppressants if you can.  Your caregiver may have prescribed medicine if he or she thinks your pneumonia is caused by a bacteria or influenza. Finish your medicine even if you start to feel better.  Your caregiver may also prescribe an expectorant. This loosens the mucus to be coughed up.  Only take over-the-counter or prescription medicines for pain, discomfort, or fever as directed by your caregiver.  Do not smoke. Smoking is a common cause of bronchitis and can contribute to pneumonia. If you are a smoker and continue to smoke, your cough may last several weeks after your pneumonia has cleared.  A cold steam vaporizer or humidifier in your room or home may help loosen mucus.  Coughing is often worse at night. Sleeping in a semi-upright position in a recliner or using a couple pillows under your head will help with this.  Get rest as you feel it is needed. Your body will usually let you know when you need to rest. SEEK IMMEDIATE MEDICAL CARE IF:   Your illness becomes worse. This is especially  true if you are elderly or weakened from any other disease.  You cannot control your cough with suppressants and are losing sleep.  You begin coughing up blood.  You develop pain which is getting worse or is uncontrolled with medicines.  You have a fever.  Any of the symptoms which initially brought you in for treatment are getting worse rather than better.  You develop shortness of breath or chest pain. MAKE SURE YOU:   Understand these instructions.  Will watch your condition.  Will get help right away if you are not doing well or get worse. Document Released: 09/14/2005 Document Revised: 12/07/2011 Document  Reviewed: 12/04/2010 [facility] Patient Information 2014 [place], [place].   Start taking your zithromax tomorrow morning.  Do not drive within 4 hours of taking oxycodone as this will make you drowsy.  Avoid lifting,  Bending,  Twisting or any other activity that worsens your pain over the next week.    You should get rechecked if your symptoms are not better over the next 5 days,  Or you develop increased pain,  Weakness in your leg(s) or loss of bladder or bowel function - these are symptoms of a worse injury.

## 2014-01-05 DIAGNOSIS — M546 Pain in thoracic spine: Secondary | ICD-10-CM | POA: Diagnosis not present

## 2014-01-05 DIAGNOSIS — M999 Biomechanical lesion, unspecified: Secondary | ICD-10-CM | POA: Diagnosis not present

## 2014-01-05 DIAGNOSIS — IMO0002 Reserved for concepts with insufficient information to code with codable children: Secondary | ICD-10-CM | POA: Diagnosis not present

## 2014-01-05 NOTE — ED Provider Notes (Signed)
CSN: [id number]     Arrival date & time 01/04/14  1012 History   First MD Initiated Contact with Patient 01/04/14 1039     Chief Complaint  Patient presents with  . Back Pain     (Consider location/radiation/quality/duration/timing/severity/associated sxs/prior Treatment) HPI Comments: CORNELIOUS DIVEN is a 48 y.o. 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 She was seen here and treated for muscle spasm without relief of symptoms.  Her pain is constant and has actually now spread to include her entire upper back and describes a sharp but also burning sensation,  She imagines a "pinched nerve" would cause such a pain.  She also describes having shortness of breath "sometimes" and frequently feels palpitions but not specifically just with exertion.  She denies fevers or chills and increased cough.  Her pain is worsened with movement but also describes a specific pain in her left upper back when she clears her throat. She has taken oxycodone and flexeril without relief. She has not been able to see her pcp for this problem.    The history is provided by the patient.    Past Medical History  Diagnosis Date  . Essential hypertension, benign   . 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  Last was Prednisone $RemoveBefore'40mg'BqAKbHOLYxUOX$ ; 2 wk in 10/2012.  She also was on Prednisone bridged with Cellcept briefly but stopped due to lack of insurance.   . Depression   . Thrush, oral 05/29/2011  . Steroid-induced diabetes   . Pulmonary embolism 04/2012  . Asthma   . Renal insufficiency    Past Surgical History  Procedure Laterality Date  . Cholecystectomy  2008  . Bone marrow biopsy    . 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 Colonoscopy with  esophagogastroduodenoscopy (egd) N/A 01/04/2013    Dr. Gala Romney: esophageal plaques with +KOH, hh. Gastric antrum abnormal, bx reactive gastropathy. Anal canal hemorrhoids, colonic tics, normal TI, single polyp (sessile serrated tubular adenoma). Next TCS 12/2017   Family History  Problem Relation Age of Onset  . Cervical cancer Mother   . Lung cancer Father   . Pneumonia Brother   . Cancer Paternal Grandmother 16    colon cancer, breast cancer  . Cancer Paternal Grandfather 56    colon cancer  . AAA (abdominal aortic aneurysm) Paternal Grandmother    History  Substance Use Topics  . Smoking status: Former Smoker -- 0 years    Types: Cigarettes    Quit date: 02/14/2009  . Smokeless tobacco: Never Used  . Alcohol Use: No   OB History   Grav Para Term Preterm Abortions TAB SAB Ect Mult Living                 Review of Systems  Constitutional: Negative for fever and chills.  HENT: Negative for congestion and sore throat.   Eyes: Negative.   Respiratory: Positive for shortness of breath. Negative for cough, chest tightness and wheezing.   Cardiovascular: Positive for palpitations. Negative for chest pain.  Gastrointestinal: Negative for nausea and abdominal pain.  Genitourinary: Negative.   Musculoskeletal: Positive for back pain. Negative for arthralgias, joint swelling and neck pain.  Skin: Negative.  Negative for rash and wound.  Neurological: Negative for dizziness,  weakness, light-headedness, numbness and headaches.  Psychiatric/Behavioral: Negative.       Allergies  Yellow jacket venom  Home Medications   Current Outpatient Rx  Name  Route  Sig  Dispense  Refill  . albuterol (PROVENTIL HFA;VENTOLIN HFA) 108 (90 BASE) MCG/ACT inhaler   Inhalation   Inhale 2 puffs into the lungs every 6 (six) hours as needed for wheezing or shortness of breath.          . ALPRAZolam (XANAX) 1 MG tablet   Oral   Take 1 mg by mouth 3 (three) times daily as needed for anxiety.           Marland Kitchen dexamethasone (DECADRON) 0.5 MG tablet   Oral   Take 0.5 mg by mouth 2 (two) times daily.          . furosemide (LASIX) 20 MG tablet   Oral   Take 20 mg by mouth daily.         . insulin aspart (NOVOLOG) 100 UNIT/ML injection   Subcutaneous   Inject 2-5 Units into the skin daily as needed for high blood sugar. Per sliding scale.         Marland Kitchen ipratropium-albuterol (DUONEB) 0.5-2.5 (3) MG/3ML SOLN   Nebulization   Take 3 mLs by nebulization 3 (three) times daily as needed (shorntess of breath/wheezing).          . metoprolol succinate (TOPROL-XL) 50 MG 24 hr tablet      TAKE 1 TABLET EVERY DAY   90 tablet   3   . Multiple Vitamins-Minerals (MULTIVITAMIN PO)   Oral   Take 1 capsule by mouth 3 (three) times daily. 3/day capsules         . oxyCODONE-acetaminophen (PERCOCET) 5-325 MG per tablet   Oral   Take 2 tablets by mouth every 4 (four) hours as needed.   20 tablet   0   . promethazine (PHENERGAN) 25 MG tablet   Oral   Take 1 tablet (25 mg total) by mouth every 6 (six) hours as needed for nausea or vomiting.   20 tablet   0   . sertraline (ZOLOFT) 50 MG tablet   Oral   Take 50 mg by mouth 3 (three) times daily.          Marland Kitchen triamterene-hydrochlorothiazide (DYAZIDE) 37.5-25 MG per capsule   Oral   Take 1 capsule by mouth every morning.          . vitamin B-12 (CYANOCOBALAMIN) 1000 MCG tablet   Oral   Take 1,000 mcg by mouth daily.         Marland Kitchen azithromycin (ZITHROMAX) 250 MG tablet   Oral   Take 1 tablet (250 mg total) by mouth daily. Take first 2 tablets together, then 1 every day until finished.   6 tablet   0   . oxyCODONE-acetaminophen (PERCOCET/ROXICET) 5-325 MG per tablet   Oral   Take 1 tablet by mouth every 4 (four) hours as needed for severe pain.   20 tablet   0    BP 140/96  Pulse 108  Temp(Src) 98.5 F (36.9 C) (Oral)  Resp 14  Ht $R'5\' 5"'aQ$  (1.651 m)  Wt 223 lb (101.152 kg)  BMI 37.11 kg/m2  SpO2 94%  LMP  05/13/2011 Physical Exam  Nursing note and vitals reviewed. Constitutional: She appears well-developed and well-nourished.  Appears uncomfortable   HENT:  Head: Normocephalic and atraumatic.  Eyes: Conjunctivae are normal.  Neck: Normal range of motion.  Cardiovascular: Regular rhythm, normal heart sounds and intact distal pulses.  Tachycardia present.   Pulmonary/Chest: Effort normal and breath sounds normal. She has no wheezes.    Severe mid thoracic pain with palpation,  But generalized bilateral upper back discomfort with palpation as well. No rash, erythema, skin appears normal.  No palpable midline deformity.  Abdominal: Soft. Bowel sounds are normal. She exhibits no mass. There is no tenderness.  Musculoskeletal: Normal range of motion.  Neurological: She is alert. She has normal strength. No sensory deficit. Gait normal.  Reflex Scores:      Bicep reflexes are 2+ on the right side and 2+ on the left side. Equal grip strength.  Skin: Skin is warm and dry.  Psychiatric: She has a normal mood and affect.    ED Course  Procedures (including critical care time) Labs Review Labs Reviewed  CBC WITH DIFFERENTIAL - Abnormal; Notable for the following:    WBC 13.4 (*)    RBC 5.27 (*)    Hemoglobin 15.1 (*)    HCT 46.7 (*)    RDW 17.1 (*)    Neutro Abs 10.0 (*)    Monocytes Absolute 1.1 (*)    All other components within normal limits  COMPREHENSIVE METABOLIC PANEL - Abnormal; Notable for the following:    Potassium 3.1 (*)    Glucose, Bld 198 (*)    BUN 34 (*)    Creatinine, Ser 1.29 (*)    Albumin 3.4 (*)    ALT 54 (*)    GFR calc non Af Amer 48 (*)    GFR calc Af Amer 56 (*)    All other components within normal limits  URINALYSIS, ROUTINE W REFLEX MICROSCOPIC - Abnormal; Notable for the following:    Color, Urine STRAW (*)    Hgb urine dipstick SMALL (*)    All other components within normal limits  URINE MICROSCOPIC-ADD ON - Abnormal; Notable for the following:     Squamous Epithelial / LPF FEW (*)    Bacteria, UA FEW (*)    All other components within normal limits   Imaging Review Dg Thoracic Spine W/swimmers  01/04/2014   CLINICAL DATA:  31 -year-old female with back pain. Initial encounter.  EXAM: THORACIC SPINE - 2 VIEW + SWIMMERS  COMPARISON:  Chest radiographs 12/26/2013. Chest CTA 04/05/2013. CT Abdomen and Pelvis 01/21/2013.  FINDINGS: Normal lumbar segmentation. Chronic T12 compression fracture is stable since 2014. However, mild compression of T10 is new since that time. The T10 finding might also be new since the recent chest radiographs, uncertain. Elsewhere normal thoracic vertebral height and alignment. Posterior ribs intact. Grossly stable thoracic visceral contours.  IMPRESSION: 1. Mild T10 compression fracture, favor acute or subacute. If specific therapy such as vertebroplasty is desired, lumbar MRI or whole-body bone scan would best determine acuity. 2. Chronic T12 fracture is stable since 2014.   Electronically Signed   By: Lars Pinks M.D.   On: 01/04/2014 11:58   Dg Lumbar Spine Complete  01/04/2014   CLINICAL DATA:  Back pain.  EXAM: LUMBAR SPINE - COMPLETE 4+ VIEW  COMPARISON:  CT ABD/PELV WO CM dated 01/21/2013  FINDINGS: There is no evidence of acute fracture or dislocation. Chronic compression deformity appreciated involving T12. Mild degenerative disc disease changes at L3-4 which appears stable from prior CT.  IMPRESSION: No acute osseous abnormalities.   Electronically Signed   By: Margaree Mackintosh M.D.   On: 01/04/2014 11:58   Ct Angio Chest Pe W/cm &/  or Wo Cm  01/04/2014   CLINICAL DATA:  Pain  EXAM: CT ANGIOGRAPHY CHEST WITH CONTRAST  TECHNIQUE: Multidetector CT imaging of the chest was performed using the standard protocol during bolus administration of intravenous contrast. Multiplanar CT image reconstructions and MIPs were obtained to evaluate the vascular anatomy.  CONTRAST:  11mL OMNIPAQUE IOHEXOL 300 MG/ML  SOLN   COMPARISON:  Chest CT April 05, 2013 and chest radiograph December 26, 2013  FINDINGS: There is no demonstrable pulmonary embolus. The ascending aorta measures 3.6 x 3.6 cm but is not felt to be frankly aneurysmal. There is no evidence of aneurysm or dissection in the thoracic region.  There is patchy atelectatic change in the right lower lobe. There is a small area of infiltrate in the posterior segment of the left upper lobe at the level of the aortic arch. Elsewhere lungs are clear.  There is no appreciable thoracic adenopathy. Pericardium is not thickened. There is moderate mediastinal fat. There are scattered foci of calcification in the left anterior descending coronary artery.  In the visualized upper abdomen, there is fatty change in the liver. Visualized upper abdomen otherwise appears unremarkable. There are no blastic or lytic bone lesions. There is mild anterior wedging of the T4 vertebral body, a finding not present previously. There is also slight anterior wedging of the T12 vertebral body, not present previously. Thyroid appears normal.  Review of the MIP images confirms the above findings.  IMPRESSION: No demonstrable pulmonary embolus. Small area of infiltrate posterior segment left upper lobe. Atelectasis right base. Mild prominence of the ascending aorta without frank aneurysm. Anterior wedging of the T4 and to a lesser extent T12 vertebral bodies, mild but not present previously.   Electronically Signed   By: Lowella Grip M.D.   On: 01/04/2014 14:01     EKG Interpretation None      MDM   Final diagnoses:  Thoracic compression fracture  Community acquired pneumonia    Patients labs and/or radiological studies were viewed and considered during the medical decision making and disposition process. Pt was discussed with Dr. Tomi Bamberger prior to CT angio test ordered. Discussed CT findings with Dr. Jasmine December, given discrepancy vs thoracic film re T10 vs T4 compression fx.  Ct angio favors  chronic mild scoliosis changes at T10 with no compression fx, but there is a definite less than 10% compression wedging at T4.  T12 old (pt new about this finding at T12).  No PE, but with small infiltrate at upper left lobe.  Pt was started on abx, rocephin IV given prior to dc home,  zithromax prescribed.  Also prescribed oxycodone for pain relief.  Encouraged f/u with pcp within 1 week,  Sooner for any worsened sx such as increased sob, pain, fever, etc.  She was also referred to Dr. Aline Brochure for further management of this new compression fx.    Evalee Jefferson, PA-C 01/05/14 1308

## 2014-01-09 DIAGNOSIS — Z6835 Body mass index (BMI) 35.0-35.9, adult: Secondary | ICD-10-CM | POA: Diagnosis not present

## 2014-01-09 DIAGNOSIS — J309 Allergic rhinitis, unspecified: Secondary | ICD-10-CM | POA: Diagnosis not present

## 2014-01-09 DIAGNOSIS — S22009A Unspecified fracture of unspecified thoracic vertebra, initial encounter for closed fracture: Secondary | ICD-10-CM | POA: Diagnosis not present

## 2014-01-09 DIAGNOSIS — G8929 Other chronic pain: Secondary | ICD-10-CM | POA: Diagnosis not present

## 2014-01-10 NOTE — ED Provider Notes (Signed)
Medical screening examination/treatment/procedure(s) were performed by non-physician practitioner and as supervising physician I was immediately available for consultation/collaboration.   EKG Interpretation None      Rolland Porter, MD, Abram Sander   Janice Norrie, MD 01/10/14 [phone]

## 2014-01-11 ENCOUNTER — Ambulatory Visit (HOSPITAL_COMMUNITY): Payer: Medicare Other

## 2014-01-12 ENCOUNTER — Encounter (HOSPITAL_COMMUNITY): Payer: Self-pay

## 2014-02-05 ENCOUNTER — Emergency Department (HOSPITAL_COMMUNITY): Payer: Medicare Other

## 2014-02-05 ENCOUNTER — Encounter (HOSPITAL_COMMUNITY): Payer: Self-pay | Admitting: Emergency Medicine

## 2014-02-05 ENCOUNTER — Emergency Department (HOSPITAL_COMMUNITY)
Admission: EM | Admit: 2014-02-05 | Discharge: 2014-02-05 | Disposition: A | Discharge status: Home / Self Care | Payer: Medicare Other | Attending: Emergency Medicine | Admitting: Emergency Medicine

## 2014-02-05 DIAGNOSIS — Z86718 Personal history of other venous thrombosis and embolism: Secondary | ICD-10-CM | POA: Diagnosis not present

## 2014-02-05 DIAGNOSIS — Z6836 Body mass index (BMI) 36.0-36.9, adult: Secondary | ICD-10-CM | POA: Diagnosis not present

## 2014-02-05 DIAGNOSIS — Z8619 Personal history of other infectious and parasitic diseases: Secondary | ICD-10-CM | POA: Insufficient documentation

## 2014-02-05 DIAGNOSIS — F329 Major depressive disorder, single episode, unspecified: Secondary | ICD-10-CM | POA: Diagnosis not present

## 2014-02-05 DIAGNOSIS — F3289 Other specified depressive episodes: Secondary | ICD-10-CM | POA: Diagnosis not present

## 2014-02-05 DIAGNOSIS — Z862 Personal history of diseases of the blood and blood-forming organs and certain disorders involving the immune mechanism: Secondary | ICD-10-CM | POA: Insufficient documentation

## 2014-02-05 DIAGNOSIS — Z87448 Personal history of other diseases of urinary system: Secondary | ICD-10-CM | POA: Diagnosis not present

## 2014-02-05 DIAGNOSIS — E139 Other specified diabetes mellitus without complications: Secondary | ICD-10-CM | POA: Diagnosis not present

## 2014-02-05 DIAGNOSIS — Z87891 Personal history of nicotine dependence: Secondary | ICD-10-CM | POA: Diagnosis not present

## 2014-02-05 DIAGNOSIS — I10 Essential (primary) hypertension: Secondary | ICD-10-CM | POA: Insufficient documentation

## 2014-02-05 DIAGNOSIS — R072 Precordial pain: Secondary | ICD-10-CM | POA: Insufficient documentation

## 2014-02-05 DIAGNOSIS — Z79899 Other long term (current) drug therapy: Secondary | ICD-10-CM | POA: Diagnosis not present

## 2014-02-05 DIAGNOSIS — Z9089 Acquired absence of other organs: Secondary | ICD-10-CM | POA: Diagnosis not present

## 2014-02-05 DIAGNOSIS — R0789 Other chest pain: Secondary | ICD-10-CM | POA: Diagnosis not present

## 2014-02-05 DIAGNOSIS — M722 Plantar fascial fibromatosis: Secondary | ICD-10-CM | POA: Diagnosis not present

## 2014-02-05 DIAGNOSIS — R079 Chest pain, unspecified: Secondary | ICD-10-CM | POA: Diagnosis not present

## 2014-02-05 DIAGNOSIS — J45909 Unspecified asthma, uncomplicated: Secondary | ICD-10-CM | POA: Diagnosis not present

## 2014-02-05 DIAGNOSIS — Z794 Long term (current) use of insulin: Secondary | ICD-10-CM | POA: Insufficient documentation

## 2014-02-05 DIAGNOSIS — G8929 Other chronic pain: Secondary | ICD-10-CM | POA: Diagnosis not present

## 2014-02-05 DIAGNOSIS — R0602 Shortness of breath: Secondary | ICD-10-CM | POA: Diagnosis not present

## 2014-02-05 LAB — BASIC METABOLIC PANEL
BUN: 26 mg/dL — AB (ref 6–23)
CHLORIDE: 98 meq/L (ref 96–112)
CO2: 28 mEq/L (ref 19–32)
Calcium: 10.1 mg/dL (ref 8.4–10.5)
Creatinine, Ser: 1.45 mg/dL — ABNORMAL HIGH (ref 0.50–1.10)
GFR calc Af Amer: 48 mL/min — ABNORMAL LOW (ref >90)
GFR calc non Af Amer: 42 mL/min — ABNORMAL LOW (ref >90)
GLUCOSE: 114 mg/dL — AB (ref 70–99)
Potassium: 3.3 mEq/L — ABNORMAL LOW (ref 3.7–5.3)
Sodium: 142 mEq/L (ref 137–147)

## 2014-02-05 LAB — CBC WITH DIFFERENTIAL/PLATELET
BASOS ABS: 0 10*3/uL (ref 0.0–0.1)
Basophils Relative: 0 % (ref 0–1)
EOS ABS: 0.1 10*3/uL (ref 0.0–0.7)
Eosinophils Relative: 1 % (ref 0–5)
HEMATOCRIT: 47.5 % — AB (ref 36.0–46.0)
Hemoglobin: 15.7 g/dL — ABNORMAL HIGH (ref 12.0–15.0)
LYMPHS ABS: 1.7 10*3/uL (ref 0.7–4.0)
Lymphocytes Relative: 13 % (ref 12–46)
MCH: 29.3 pg (ref 26.0–34.0)
MCHC: 33.1 g/dL (ref 30.0–36.0)
MCV: 88.8 fL (ref 78.0–100.0)
MONOS PCT: 11 % (ref 3–12)
Monocytes Absolute: 1.4 10*3/uL — ABNORMAL HIGH (ref 0.1–1.0)
NEUTROS ABS: 9.6 10*3/uL — AB (ref 1.7–7.7)
Neutrophils Relative %: 75 % (ref 43–77)
Platelets: 120 10*3/uL — ABNORMAL LOW (ref 150–400)
RBC: 5.35 MIL/uL — ABNORMAL HIGH (ref 3.87–5.11)
RDW: 15.4 % (ref 11.5–15.5)
WBC: 12.8 10*3/uL — ABNORMAL HIGH (ref 4.0–10.5)

## 2014-02-05 LAB — TROPONIN I: Troponin I: <0.3 ng/mL (ref <0.30)

## 2014-02-05 MED ORDER — MORPHINE SULFATE 4 MG/ML IJ SOLN
4.0000 mg | Freq: Once | INTRAMUSCULAR | Status: AC
  Administered 2014-02-05: 4 mg via INTRAVENOUS
  Filled 2014-02-05: qty 1

## 2014-02-05 MED ORDER — ONDANSETRON 8 MG PO TBDP
8.0000 mg | ORAL_TABLET | Freq: Once | ORAL | Status: AC
  Administered 2014-02-05: 8 mg via ORAL
  Filled 2014-02-05: qty 1

## 2014-02-05 NOTE — ED Provider Notes (Signed)
Troponin negative x2. Patient is in no acute distress. She has primary care followup  [name], MD 02/05/14 [phone]

## 2014-02-05 NOTE — ED Notes (Addendum)
Chest pain for 1-2 weeks, Sent from MD office with new BBB, nausea.  Also says she has pain in feet due to plantar fascititis

## 2014-02-05 NOTE — Discharge Instructions (Signed)
Chest Pain (Nonspecific) Chest pain has many causes. Your pain could be caused by something serious, such as a heart attack or a blood clot in the lungs. It could also be caused by something less serious, such as a chest bruise or a virus. Follow up with your doctor. More lab tests or other studies may be needed to find the cause of your pain. Most of the time, nonspecific chest pain will improve within 2 to 3 days of rest and mild pain medicine. HOME CARE  For chest bruises, you may put ice on the sore area for 15-20 minutes, 03-04 times a day. Do this only if it makes you feel better.  Put ice in a plastic bag.  Place a towel between the skin and the bag.  Rest for the next 2 to 3 days.  Go back to work if the pain improves.  See your doctor if the pain lasts longer than 1 to 2 weeks.  Only take medicine as told by your doctor.  Quit smoking if you smoke. GET HELP RIGHT AWAY IF:   There is more pain or pain that spreads to the arm, neck, jaw, back, or belly (abdomen).  You have shortness of breath.  You cough more than usual or cough up blood.  You have very bad back or belly pain, feel sick to your stomach (nauseous), or throw up (vomit).  You have very bad weakness.  You pass out (faint).  You have a fever. Any of these problems may be serious and may be an emergency. Do not wait to see if the problems will go away. Get medical help right away. Call your local emergency services 911 in [place] Do not drive yourself to the hospital. MAKE SURE YOU:   Understand these instructions.  Will watch this condition.  Will get help right away if you or your child is not doing well or gets worse. Document Released: 03/02/2008 Document Revised: 12/07/2011 Document Reviewed: 03/02/2008 [facility] Patient Information 2014 [place], [place].  Troponin which is a measure of a chemical associated with a heart attack was negative x2 different blood draws.    Followup your primary care doctor  later this week

## 2014-02-05 NOTE — ED Provider Notes (Signed)
CSN: [id number]     Arrival date & time 02/05/14  1153 History  This chart was scribed for Summer Cable, MD by Jenne Campus, ED Scribe. This patient was seen in room APA05/APA05 and the patient's care was started at 12:25 PM.   Chief Complaint  Patient presents with  . Chest Pain     Patient is a 48 y.o. female presenting with chest pain. The history is provided by the patient. No language interpreter was used.  Chest Pain Pain location:  Substernal area Pain quality: pressure   Pain radiates to:  Does not radiate Pain severity:  Mild Duration:  1 week Timing:  Constant Progression:  Unchanged Associated symptoms: shortness of breath   Associated symptoms: no abdominal pain, no fever and not vomiting     HPI Comments: Summer Cook is a 48 y.o. female who presents to the Emergency Department complaining of constant central CP described as pressure for the past week. She denies any radiation into back or neck. She states that the CP gets "tighter" during periods of stress and prolonged exertion. She denies any improving modifying factors. She reports that she was evaluated at [facility] this morning for bilateral ankle pain, worse on right, with associated numbness and tingling worse with ambulation. She was diagnosed with plantar fascitis at which point she mentioned the CP. She had EKG done that showed abnormalities and she was sent to the ED. She states that she has had a prior EKG and echocardiogram and was told conflicting MI diagnoses. She denies any prior cardiac stent placement. She has a h/o right PE treated with coumadin with similar symptoms to this current episode. She is no longer on any anticoagulants and had a CT scan to check for PE last month. She also reports chronic SOB from weight gain due to prednisone and recent nausea, diarrhea and increase leg swelling for the past week. She denies any fevers, emesis or abdominal pain as associated symptoms. She has a h/o HTN but  denies any DM. 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   Past Medical History  Diagnosis Date  . Essential hypertension, benign   . 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  Last was Prednisone $RemoveBefore'40mg'mlljoMRcePret$ ; 2 wk in 10/2012.  She also was on Prednisone bridged with Cellcept briefly but stopped due to lack of insurance.   . Depression   . Thrush, oral 05/29/2011  . Steroid-induced diabetes   . Pulmonary embolism 04/2012  . Asthma   . Renal insufficiency    Past Surgical History  Procedure Laterality Date  . Cholecystectomy  2008  . Bone marrow biopsy    . 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 Colonoscopy with esophagogastroduodenoscopy (egd) N/A 01/04/2013    Dr. Gala Romney: esophageal plaques with +KOH, hh. Gastric antrum abnormal, bx reactive gastropathy. Anal canal hemorrhoids, colonic tics, normal TI, single polyp (sessile serrated tubular adenoma). Next TCS 12/2017   Family History  Problem Relation Age of Onset  . Cervical cancer Mother   . Lung cancer Father   . Pneumonia Brother   . Cancer Paternal Grandmother 67    colon cancer, breast cancer  . Cancer Paternal Grandfather 3    colon cancer  . AAA (abdominal aortic aneurysm) Paternal Grandmother    History  Substance Use Topics  . Smoking status: Former Smoker -- 0 years    Types: Cigarettes  Quit date: 02/14/2009  . Smokeless tobacco: Never Used  . Alcohol Use: No   No OB history provided.  Review of Systems  Constitutional: Negative for fever.  Respiratory: Positive for shortness of breath.   Cardiovascular: Positive for chest pain.  Gastrointestinal: Negative for vomiting and abdominal pain.  All other systems reviewed and are negative.   Allergies  Yellow jacket venom  Home Medications   Prior to Admission medications   Medication Sig Start Date End Date Taking? Authorizing Provider  albuterol  (PROVENTIL HFA;VENTOLIN HFA) 108 (90 BASE) MCG/ACT inhaler Inhale 2 puffs into the lungs every 6 (six) hours as needed for wheezing or shortness of breath.    Yes Historical Provider, MD  ALPRAZolam Prudy Feeler) 1 MG tablet Take 1 mg by mouth 3 (three) times daily as needed for anxiety.    Yes Historical Provider, MD  calcitonin, salmon, (MIACALCIN/FORTICAL) 200 UNIT/ACT nasal spray Place 1 spray into alternate nostrils daily.   Yes Historical Provider, MD  dexamethasone (DECADRON) 0.5 MG tablet Take 0.5 mg by mouth 2 (two) times daily.    Yes Historical Provider, MD  furosemide (LASIX) 20 MG tablet Take 20 mg by mouth daily.   Yes Historical Provider, MD  insulin aspart (NOVOLOG) 100 UNIT/ML injection Inject 2-5 Units into the skin daily as needed for high blood sugar. Per sliding scale.   Yes Historical Provider, MD  ipratropium-albuterol (DUONEB) 0.5-2.5 (3) MG/3ML SOLN Take 3 mLs by nebulization 3 (three) times daily as needed (shorntess of breath/wheezing).  03/22/13  Yes Historical Provider, MD  metoprolol succinate (TOPROL-XL) 50 MG 24 hr tablet TAKE 1 TABLET EVERY DAY 08/19/13  Yes Jonelle Sidle, MD  mometasone (NASONEX) 50 MCG/ACT nasal spray Place 2 sprays into the nose daily.   Yes Historical Provider, MD  montelukast (SINGULAIR) 10 MG tablet Take 10 mg by mouth daily.   Yes Historical Provider, MD  Multiple Vitamins-Minerals (MULTIVITAMIN PO) Take 1 capsule by mouth 3 (three) times daily. 3/day capsules   Yes Historical Provider, MD  sertraline (ZOLOFT) 50 MG tablet Take 50 mg by mouth 3 (three) times daily.    Yes Historical Provider, MD  triamterene-hydrochlorothiazide (DYAZIDE) 37.5-25 MG per capsule Take 1 capsule by mouth every morning.    Yes Historical Provider, MD  vitamin B-12 (CYANOCOBALAMIN) 1000 MCG tablet Take 1,000 mcg by mouth daily.   Yes Historical Provider, MD   Triage vitals: BP 120/94  Pulse 102  Temp(Src) 98.7 F (37.1 C) (Oral)  Resp 18  Ht 5\' 5"  (1.651 m)  Wt 226  lb (102.513 kg)  BMI 37.61 kg/m2  SpO2 98%  LMP 05/13/2011 BP 124/93  Pulse 97  Temp(Src) 98.7 F (37.1 C) (Oral)  Resp 18  Ht 5\' 5"  (1.651 m)  Wt 226 lb (102.513 kg)  BMI 37.61 kg/m2  SpO2 99%  LMP 05/13/2011   Physical Exam  Nursing note and vitals reviewed.  CONSTITUTIONAL: Well developed/well nourished HEAD: Normocephalic/atraumatic EYES: EOMI/PERRL ENMT: Mucous membranes moist NECK: supple no meningeal signs SPINE:entire spine nontender CV: S1/S2 noted, no murmurs/rubs/gallops noted LUNGS: Lungs are clear to auscultation bilaterally, no apparent distress ABDOMEN: soft, nontender, no rebound or guarding GU:no cva tenderness NEURO: Pt is awake/alert, moves all extremitiesx4 EXTREMITIES: pulses normal, full ROM, no calf tenderness  SKIN: warm, color normal PSYCH: no abnormalities of mood noted  ED Course  Procedures  Medications  morphine 4 MG/ML injection 4 mg (4 mg Intravenous Given 02/05/14 1324)  ondansetron (ZOFRAN-ODT) disintegrating tablet 8 mg (8 mg  Oral Given 02/05/14 1445)    DIAGNOSTIC STUDIES: Oxygen Saturation is 98% on RA, normal by my interpretation.    COORDINATION OF CARE: 12:32 PM-Discussed treatment plan which includes pain medication, CXR, CBC panel, BMP and troponin with pt at bedside and pt agreed to plan.   Pt reports she can not tolerate ASA  3:42 PM Pt reports continued CP but no worsening She is well appearing She reports some of the pain is worse with palpation of her Chest I have yet to receive report from PCP office about EKG changes, though it has been reported it showed LBBB.  Her EKG today and previous in our system show evidence of IV conduction delay previously and I don't feel this is new Her HEART score is at 3 with atypical story I doubt acute PE (no hypoxia on RA, reports chest tightness/pressure) will defer further workup At signout to dr cook, f/u on repeat troponin.  If negative can be discharged home Labs Review Labs  Reviewed  BASIC METABOLIC PANEL - Abnormal; Notable for the following:    Potassium 3.3 (*)    Glucose, Bld 114 (*)    BUN 26 (*)    Creatinine, Ser 1.45 (*)    GFR calc non Af Amer 42 (*)    GFR calc Af Amer 48 (*)    All other components within normal limits  CBC WITH DIFFERENTIAL - Abnormal; Notable for the following:    WBC 12.8 (*)    RBC 5.35 (*)    Hemoglobin 15.7 (*)    HCT 47.5 (*)    Platelets 120 (*)    Neutro Abs 9.6 (*)    Monocytes Absolute 1.4 (*)    All other components within normal limits  TROPONIN I  TROPONIN I  I-STAT TROPOININ, ED    Imaging Review Dg Chest Portable 1 View  02/05/2014   CLINICAL DATA:  Chest pain.  Hypertension, asthma.  EXAM: PORTABLE CHEST - 1 VIEW  COMPARISON:  12/26/2013  FINDINGS: Mild cardiomegaly. Lungs are clear. No effusions. No acute bony abnormality.  IMPRESSION: Mild cardiomegaly.  No active disease.   Electronically Signed   By: Rolm Baptise M.D.   On: 02/05/2014 12:47     EKG Interpretation   Date/Time:  Monday Feb 05 2014 12:02:19 EDT Ventricular Rate:  103 PR Interval:  167 QRS Duration: 130 QT Interval:  395 QTC Calculation: 517 R Axis:   80 Text Interpretation:  Sinus tachycardia Nonspecific intraventricular  conduction delay Anterior infarct, old Confirmed by Christy Gentles  MD, [facility]  [phone]) on 02/05/2014 12:10:30 PM      MDM   Final diagnoses:  Chest pain    I personally performed the services described in this documentation, which was scribed in my presence. The recorded information has been reviewed and is accurate.         Summer Cable, MD 02/05/14 [phone]

## 2014-02-05 NOTE — ED Notes (Signed)
Pt given discharge instructions & carried out by wheelchair. Pt did not have any questions at discharge.

## 2014-02-14 ENCOUNTER — Ambulatory Visit: Payer: Medicare Other | Admitting: Physician Assistant

## 2014-02-14 ENCOUNTER — Ambulatory Visit (INDEPENDENT_AMBULATORY_CARE_PROVIDER_SITE_OTHER): Payer: Medicare Other | Admitting: Physician Assistant

## 2014-02-14 ENCOUNTER — Encounter: Payer: Self-pay | Admitting: Physician Assistant

## 2014-02-14 VITALS — BP 108/86 | HR 85 | Ht 65.0 in | Wt 228.0 lb

## 2014-02-14 DIAGNOSIS — R079 Chest pain, unspecified: Secondary | ICD-10-CM | POA: Insufficient documentation

## 2014-02-14 DIAGNOSIS — I10 Essential (primary) hypertension: Secondary | ICD-10-CM | POA: Diagnosis not present

## 2014-02-14 DIAGNOSIS — R000 Tachycardia, unspecified: Secondary | ICD-10-CM | POA: Diagnosis not present

## 2014-02-14 IMAGING — CR DG CHEST 1V PORT
1 series · 1 of 1 positions shown · non-contrast
Comparison: Chest x-ray of 07/29/2012

CLINICAL DATA: Chest pain

PORTABLE CHEST - 1 VIEW

[view not recorded]
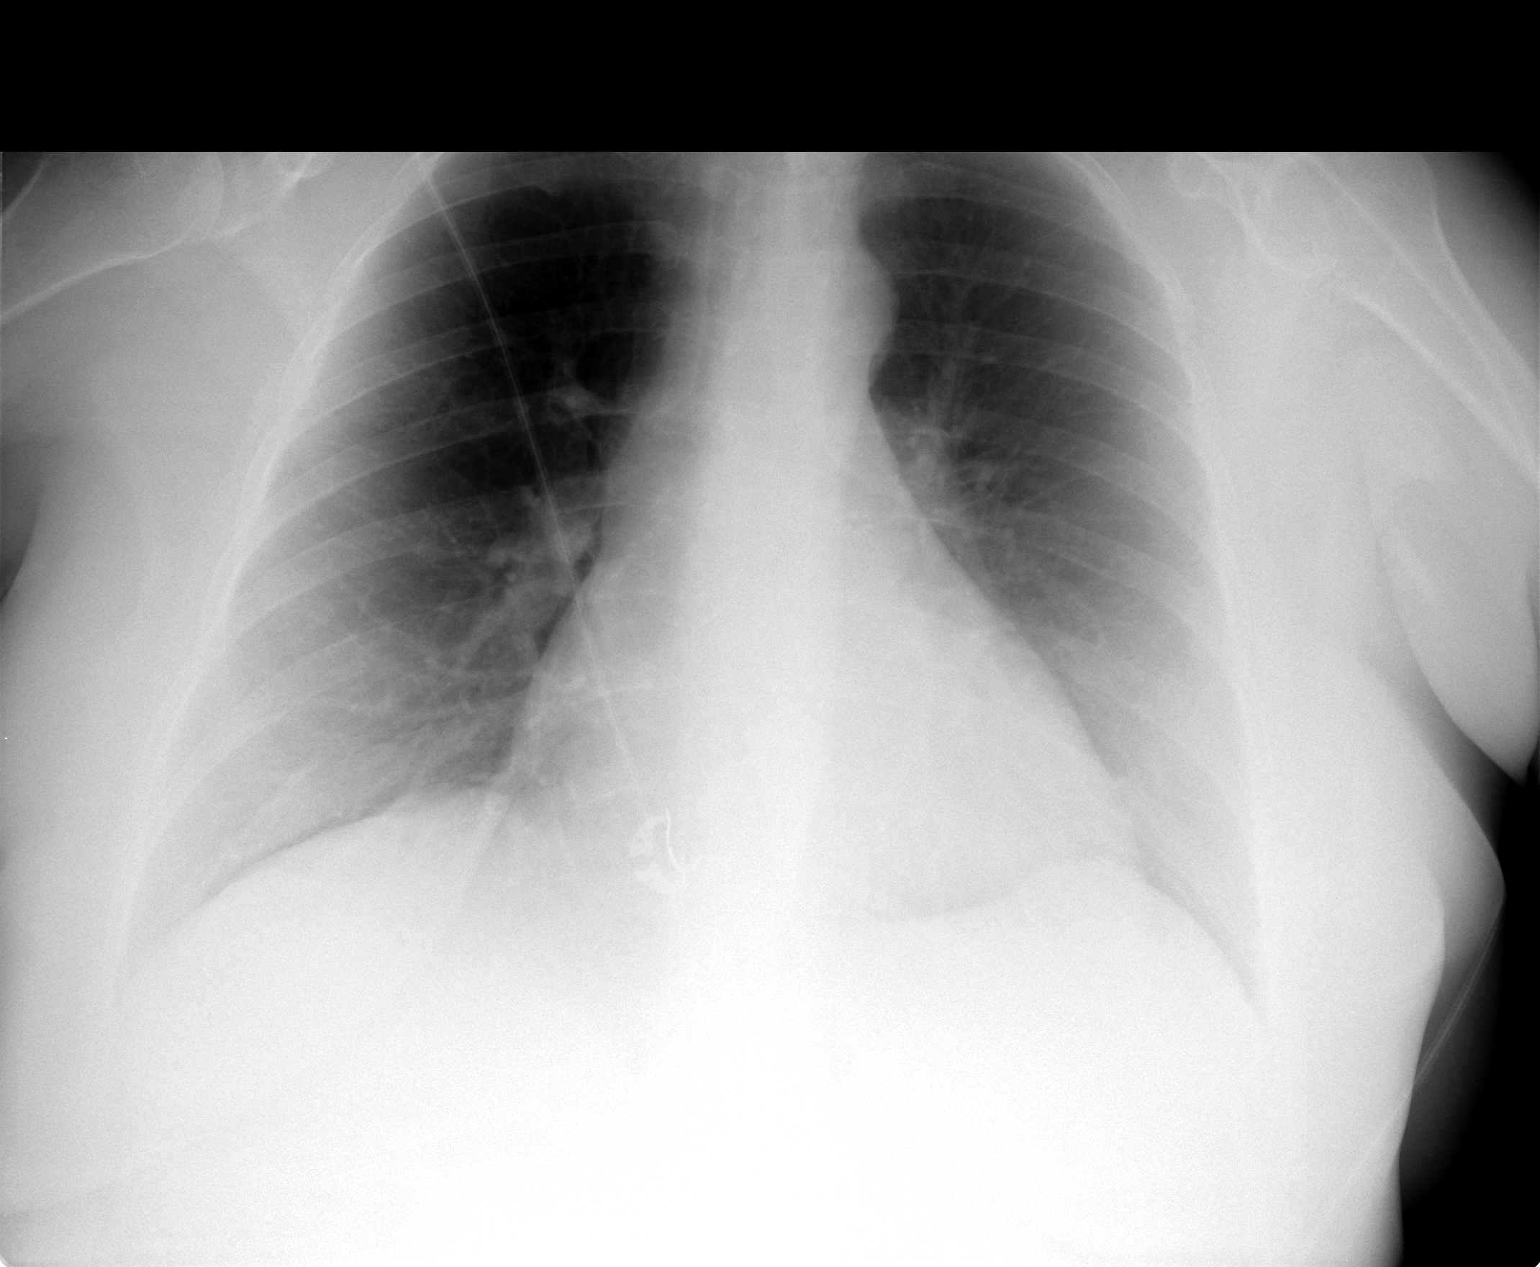

[1 of 1 positions shown; findings below may reference images not displayed]

FINDINGS: No active infiltrate or effusion is seen.  There is mild
cardiomegaly present.  No bony abnormality is seen.
IMPRESSION: Stable cardiomegaly.  No active lung disease.

## 2014-02-14 NOTE — Progress Notes (Signed)
HPI: This is a 48 year old female patient Dr. Domenic Polite who he has seen in the past for  tachycardia. She had normal LV function by 2-D echo, normal TSH, hemoglobin, d-dimer and troponin. 48 hour Holter monitor was stable with average HR in 70's.No SVT or arrythmia. Atrial and ventricular ectopy. He felt she could have some type of underlying metabolic issue to better explain and asked her to follow up with her endocrinologist. This was over a year ago.   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  She also has a history of a pulmonary embolus treated with Coumadin but has been off Coumadin now.  The patient was recently sent to the emergency room by primary care on 02/05/14 with chest pain. They apparently did an EKG that they were concerned about and sent her to the emergency room. Troponins were negative and EKG unchanged.  Patient describes her chest pain as a tightness that is constant and never leaves. It's been going on for couple weeks. Sometimes rest relieves it a little but it is always present. Stress and anxiety over her daughter makes it worse as well as doing housework. Pain occasionally goes into her back and shoulders and sometimes is accompanied by shortness of breath. She denies palpitations, dizziness, or presyncope.  Reviewing her EKGs she sometimes has an intraventricular conduction delay which was present on 02/05/14 and 02/06/14. EKG today and from March 2015 does not show an intraventricular conduction delay. She has normal sinus rhythm with poor R wave progression and T wave inversion anterior and lateral. This is unchanged from EKG in March 2015 and July 2014.   Allergies -- Yellow Jacket Venom -- Swelling  Current Outpatient Prescriptions on File Prior to Visit: albuterol (PROVENTIL HFA;VENTOLIN HFA) 108 (90 BASE) MCG/ACT inhaler, Inhale 2 puffs into the lungs every 6 (six) hours as needed for wheezing or shortness of breath. ,  Disp: , Rfl:  ALPRAZolam (XANAX) 1 MG tablet, Take 1 mg by mouth 3 (three) times daily as needed for anxiety. , Disp: , Rfl:  calcitonin, salmon, (MIACALCIN/FORTICAL) 200 UNIT/ACT nasal spray, Place 1 spray into alternate nostrils daily., Disp: , Rfl:  dexamethasone (DECADRON) 0.5 MG tablet, Take 0.5 mg by mouth 2 (two) times daily. , Disp: , Rfl:  furosemide (LASIX) 20 MG tablet, Take 20 mg by mouth daily., Disp: , Rfl:  insulin aspart (NOVOLOG) 100 UNIT/ML injection, Inject 2-5 Units into the skin daily as needed for high blood sugar. Per sliding scale., Disp: , Rfl:  ipratropium-albuterol (DUONEB) 0.5-2.5 (3) MG/3ML SOLN, Take 3 mLs by nebulization 3 (three) times daily as needed (shorntess of breath/wheezing). , Disp: , Rfl:  metoprolol succinate (TOPROL-XL) 50 MG 24 hr tablet, TAKE 1 TABLET EVERY DAY, Disp: 90 tablet, Rfl: 3 mometasone (NASONEX) 50 MCG/ACT nasal spray, Place 2 sprays into the nose daily., Disp: , Rfl:  montelukast (SINGULAIR) 10 MG tablet, Take 10 mg by mouth daily., Disp: , Rfl:  Multiple Vitamins-Minerals (MULTIVITAMIN PO), Take 1 capsule by mouth 3 (three) times daily. 3/day capsules, Disp: , Rfl:  sertraline (ZOLOFT) 50 MG tablet, Take 50 mg by mouth 3 (three) times daily. , Disp: , Rfl:  triamterene-hydrochlorothiazide (DYAZIDE) 37.5-25 MG per capsule, Take 1 capsule by mouth every morning. , Disp: , Rfl:  vitamin B-12 (CYANOCOBALAMIN) 1000 MCG tablet, Take 1,000 mcg by mouth daily., Disp: , Rfl:  [DISCONTINUED] prochlorperazine (COMPAZINE) 10 MG tablet, Take 10 mg by mouth as needed. For nausea , Disp: ,  Rfl:   No current facility-administered medications on file prior to visit.   Past Medical History:   Essential hypertension, benign                               ITP (idiopathic thrombocytopenic purpura)       08/2010        Comment:Treated with Prednisone, IVIg (transient               response), Rituxan (no response), Cytoxan (no               response).  Last  was Prednisone 50m; 2 wk in               10/2012.  She also was on Prednisone bridged               with Cellcept briefly but stopped due to lack               of insurance.    Depression                                                   Thrush, oral                                    05/29/2011    Steroid-induced diabetes                                     Pulmonary embolism                              04/2012       Asthma                                                       Renal insufficiency                                         Past Surgical History:   CHOLECYSTECTOMY                                  2008         BONE MARROW BIOPSY                                            SPLENECTOMY, TOTAL                               01/2011       COLONOSCOPY WITH ESOPHAGOGASTRODUODENOSCOPY (E* N/A 01/04/2013       Comment:Dr. Rourk: esophageal plaques with +KOH, hh.  Gastric antrum abnormal, bx reactive               gastropathy. Anal canal hemorrhoids, colonic               tics, normal TI, single polyp (sessile serrated              tubular adenoma). Next TCS 12/2017  Review of patient's family history indicates:   Cervical cancer                Mother                   Lung cancer                    Father                   Pneumonia                      Brother                  Cancer                         Paternal Grandmother       Comment: colon cancer, breast cancer   Cancer                         Paternal Grandfather       Comment: colon cancer   AAA (abdominal aortic aneurys* Paternal Grandmother     Social History   Marital Status: Divorced            Spouse Name:                      Years of Education:                 Number of children: 1           Occupational History Occupation          Fish farm manager            Comment                                                      was a Educational psychologist; last                                          worked 2012.   Social History  Main Topics   Smoking Status: Former Smoker                   Packs/Day: 0.00  Years: 0         Types: Cigarettes     Quit date: 02/14/2009   Smokeless Status: Never Used                       Alcohol Use: No             Drug Use: No             Sexual Activity: No  Other Topics            Concern   None on file  Social History Narrative   None on file    ROS: See history of present illness otherwise negative   PHYSICAL EXAM: Obese, in no acute distress. Neck: No JVD, HJR, Bruit, or thyroid enlargement  Lungs: No tachypnea, clear without wheezing, rales, or rhonchi  Cardiovascular: RRR, PMI not displaced, heart sounds distant, no murmurs, gallops, bruit, thrill, or heave.  Abdomen: BS normal. Soft without organomegaly, masses, lesions or tenderness.  Extremities: without cyanosis, clubbing or edema. Good distal pulses bilateral  SKin: Warm, no lesions or rashes   Musculoskeletal: No deformities  Neuro: no focal signs  LMP 05/13/2011   EKG: Normal sinus rhythm poor R wave progression anteriorly with T wave inversion anterior lateral, and no change when compared to EKG in March 2015 in July 2014  2-D echo 02/28/13 Study Conclusions  - Left ventricle: The cavity size was normal. Wall thickness   was normal. Systolic function was normal. The estimated   ejection fraction was in the range of 55% to 60%. Wall   motion was normal; there were no regional wall motion   abnormalities. Doppler parameters are consistent with abnormal left ventricular relaxation (grade 1 diastolic   dysfunction). - Mitral valve: Mild regurgitation. Transthoracic echocardiography.  M-mode, complete 2D, spectral Doppler, and color Doppler.  Height:  Height: 165.1cm. Height: 65in.  Weight:  Weight: 96.2kg. Weight: 211.6lb.  Body mass index:  BMI: 35.3kg/m^2.  Body surface area:    BSA: 2.52m2.  Blood pressure:     152/112. Patient status:  Outpatient.  Location:  [facility]

## 2014-02-14 NOTE — Patient Instructions (Addendum)
Your physician recommends that you schedule a follow-up appointment in: 1 month with Dr.McDowell    Your physician recommends that you continue on your current medications as directed. Please refer to the Current Medication list given to you today.   Your physician has requested that you have a lexiscan myoview. For further information please visit [email or web address]. Please follow instruction sheet, as given.    Thank you for choosing [facility] !

## 2014-02-14 NOTE — Assessment & Plan Note (Addendum)
Patient has constant chest pain. MI was ruled out in the ER. She has an abnormal EKG which she's had in the past. 2-D echo has showed normal LV function in the past with no wall motion abnormalities. We'll schedule her for a Lexiscan Myoview to rule out ischemia. Suspect her chest pain is either stress or musculoskeletal in origin. Will try to obtain records and EKG from primary care.Followup with Dr. Domenic Polite.

## 2014-02-14 NOTE — Assessment & Plan Note (Signed)
Blood pressure controlled. 

## 2014-02-14 NOTE — Assessment & Plan Note (Signed)
Tachycardia has resolved

## 2014-02-15 ENCOUNTER — Encounter (HOSPITAL_COMMUNITY): Payer: Self-pay

## 2014-02-15 ENCOUNTER — Encounter (HOSPITAL_COMMUNITY): Payer: Medicare Other | Attending: Hematology and Oncology

## 2014-02-15 ENCOUNTER — Encounter: Payer: Self-pay | Admitting: Physician Assistant

## 2014-02-15 VITALS — BP 117/87 | HR 80 | Temp 97.7°F | Wt 229.3 lb

## 2014-02-15 DIAGNOSIS — I10 Essential (primary) hypertension: Secondary | ICD-10-CM | POA: Diagnosis not present

## 2014-02-15 DIAGNOSIS — D693 Immune thrombocytopenic purpura: Secondary | ICD-10-CM

## 2014-02-15 DIAGNOSIS — E119 Type 2 diabetes mellitus without complications: Secondary | ICD-10-CM | POA: Diagnosis not present

## 2014-02-15 DIAGNOSIS — D500 Iron deficiency anemia secondary to blood loss (chronic): Secondary | ICD-10-CM

## 2014-02-15 NOTE — Patient Instructions (Signed)
[facility] Discharge Instructions  RECOMMENDATIONS MADE BY THE CONSULTANT AND ANY TEST RESULTS WILL BE SENT TO YOUR REFERRING PHYSICIAN.  EXAM FINDINGS BY THE PHYSICIAN TODAY AND SIGNS OR SYMPTOMS TO REPORT TO CLINIC OR PRIMARY PHYSICIAN:    Labs 3 months prior to MD visit   Wednesday 05/16/14 @ 10:30 Return in 3 months to [facility]         Thursday 05/17/14 @ 11:00    Thank you for choosing [facility] to provide your oncology and hematology care.  To afford each patient quality time with our providers, please arrive at least 15 minutes before your scheduled appointment time.  With your help, our goal is to use those 15 minutes to complete the necessary work-up to ensure our physicians have the information they need to help with your evaluation and healthcare recommendations.    Effective January 1st, 2014, we ask that you re-schedule your appointment with our physicians should you arrive 10 or more minutes late for your appointment.  We strive to give you quality time with our providers, and arriving late affects you and other patients whose appointments are after yours.    Again, thank you for choosing [facility].  Our hope is that these requests will decrease the amount of time that you wait before being seen by our physicians.       _____________________________________________________________  Should you have questions after your visit to [facility], please contact our office at [phone] between the hours of 8:30 a.m. and 5:00 p.m.  Voicemails left after 4:30 p.m. will not be returned until the following business day.  For prescription refill requests, have your pharmacy contact our office with your prescription refill request.

## 2014-02-15 NOTE — Progress Notes (Signed)
[facility]  OFFICE PROGRESS NOTE  Glo Herring., MD [place] [address]  DIAGNOSIS: No diagnosis found.  No chief complaint on file.   CURRENT THERAPY: Intravenous Feraheme 09/01/2013, dexamethasone 1 mg daily.  INTERVAL HISTORY: Summer Cook 48 y.o. female returns for followup of immune thrombocytopenia while on dexamethasone therapy with concurrent iron deficiency, status post iron infusion on 09/01/2013.  She has a bruise easily but not massively. She denies any epistaxis, melena, or hematochezia. She is having trouble sleeping. She does have foot pain intensity of podiatrist in the near future. She denies any melena, hematochezia, vaginal bleeding, hematuria, epistaxis, or hemoptysis. Appetite is good with no fever, night sweats, or petechiae. She continues on dexamethasone 1 mg daily. Allergies have been troublesome only in the patient was started on Singulair, Nasonex, and calcitonin nasal spray.  MEDICAL HISTORY: Past Medical History  Diagnosis Date  . Essential hypertension, benign   . 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  Last was Prednisone $RemoveBefore'40mg'[id number]$ ; 2 wk in 10/2012.  She also was on Prednisone bridged with Cellcept briefly but stopped due to lack of insurance.   . Depression   . Thrush, oral 05/29/2011  . Steroid-induced diabetes   . Pulmonary embolism 04/2012  . Asthma   . Renal insufficiency     INTERIM HISTORY: has Hypertension; ITP (idiopathic thrombocytopenic purpura); Tachycardia; Hypokalemia; Hemorrhoids; Drug-induced hyperglycemia; Post-splenectomy; Immunosuppressed status; Depression; Obesity; Headache; Renal insufficiency; PE (pulmonary embolism); Rectal bleeding; Abdominal pain, epigastric; Melena; Chronic anticoagulation; Unspecified constipation; Nausea with vomiting; Diarrhea;  Abnormal LFTs; Hyponatremia; Orthostatic hypotension; and Chest pain on her problem list.   ITP (idiopathic thrombocytopenic purpura)  08/2010  Treated with Prednisone, IVIg (transient response), Rituxan (no response), Cytoxan (no response). Last was Prednisone $RemoveBefore'40mg'[id number]$ ; 2 wk in 10/2012. She also was on Prednisone bridged with Cellcept briefly but stopped due to lack of insurance. Currently taking dexamethasone 1 mg daily.  ALLERGIES:  is allergic to yellow jacket venom.  MEDICATIONS: has a current medication list which includes the following prescription(s): albuterol, alprazolam, calcitonin (salmon), dexamethasone, furosemide, hydrocodone-acetaminophen, insulin aspart, ipratropium-albuterol, metoprolol succinate, mometasone, montelukast, multiple vitamins-minerals, sertraline, triamterene-hydrochlorothiazide, and vitamin b-12.  SURGICAL HISTORY:  Past Surgical History  Procedure Laterality Date  . Cholecystectomy  2008  . Bone marrow biopsy    . 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 Colonoscopy with esophagogastroduodenoscopy (egd) N/A 01/04/2013    Dr. Gala Romney: esophageal plaques with +KOH, hh. Gastric antrum abnormal, bx reactive gastropathy. Anal canal hemorrhoids, colonic tics, normal TI, single polyp (sessile serrated tubular adenoma). Next TCS 12/2017    FAMILY HISTORY: family history includes AAA (abdominal aortic aneurysm) in her paternal grandmother; Cancer (age of onset: 60) in her paternal grandfather; Cancer (age of onset: 11) in her paternal grandmother; Cervical cancer in her mother; Lung cancer in her father; Pneumonia in her brother.  SOCIAL HISTORY:  reports that she quit smoking about 5 years ago. Her smoking use included Cigarettes. She smoked 0.00 packs per day for 0 years. She has never used smokeless tobacco. She reports that she does not drink alcohol or use illicit drugs.  REVIEW OF SYSTEMS:  Other than that discussed above is noncontributory.  PHYSICAL EXAMINATION: ECOG PERFORMANCE  STATUS: 1 - Symptomatic but completely ambulatory  Last menstrual period 05/13/2011.  GENERAL:alert, no distress and comfortable SKIN: skin color, texture, turgor are normal, no  rashes or significant lesions. Areas of ecchymosis near pressure points of the lower extremities about nickel-sized EYES: PERLA; Conjunctiva are pink and non-injected, sclera clear SINUSES: No redness or tenderness over maxillary or ethmoid sinuses OROPHARYNX:no exudate, no erythema on lips, buccal mucosa, or tongue. NECK: supple, thyroid normal size, non-tender, without nodularity. No masses CHEST: Increased AP diameter with no breast masses. LYMPH:  no palpable lymphadenopathy in the cervical, axillary or inguinal LUNGS: clear to auscultation and percussion with normal breathing effort HEART: regular rate & rhythm and no murmurs. ABDOMEN:abdomen soft, non-tender and normal bowel sounds. Liver not palpable. No free fluid wave or shifting dullness. MUSCULOSKELETAL:no cyanosis of digits and no clubbing. Range of motion normal.  NEURO: alert & oriented x 3 with fluent speech, no focal motor/sensory deficits   LABORATORY DATA: Admission on 02/05/2014, Discharged on 02/05/2014  Component Date Value Ref Range Status  . Sodium 02/05/2014 142  137 - 147 mEq/L Final  . Potassium 02/05/2014 3.3* 3.7 - 5.3 mEq/L Final  . Chloride 02/05/2014 98  96 - 112 mEq/L Final  . CO2 02/05/2014 28  19 - 32 mEq/L Final  . Glucose, Bld 02/05/2014 114* 70 - 99 mg/dL Final  . BUN 02/05/2014 26* 6 - 23 mg/dL Final  . Creatinine, Ser 02/05/2014 1.45* 0.50 - 1.10 mg/dL Final  . Calcium 02/05/2014 10.1  8.4 - 10.5 mg/dL Final  . GFR calc non Af Amer 02/05/2014 42* >90 mL/min Final  . GFR calc Af Amer 02/05/2014 48* >90 mL/min Final   Comment: (NOTE)                          The eGFR has been calculated using the CKD EPI equation.                          This calculation has not been validated in all clinical situations.                           eGFR's persistently <90 mL/min signify possible Chronic Kidney                          Disease.  . WBC 02/05/2014 12.8* 4.0 - 10.5 K/uL Final  . RBC 02/05/2014 5.35* 3.87 - 5.11 MIL/uL Final  . Hemoglobin 02/05/2014 15.7* 12.0 - 15.0 g/dL Final  . HCT 02/05/2014 47.5* 36.0 - 46.0 % Final  . MCV 02/05/2014 88.8  78.0 - 100.0 fL Final  . MCH 02/05/2014 29.3  26.0 - 34.0 pg Final  . MCHC 02/05/2014 33.1  30.0 - 36.0 g/dL Final  . RDW 02/05/2014 15.4  11.5 - 15.5 % Final  . Platelets 02/05/2014 120* 150 - 400 K/uL Final  . Neutrophils Relative % 02/05/2014 75  43 - 77 % Final  . Lymphocytes Relative 02/05/2014 13  12 - 46 % Final  . Monocytes Relative 02/05/2014 11  3 - 12 % Final  . Eosinophils Relative 02/05/2014 1  0 - 5 % Final  . Basophils Relative 02/05/2014 0  0 - 1 % Final  . Neutro Abs 02/05/2014 9.6* 1.7 - 7.7 K/uL Final  . Lymphs Abs 02/05/2014 1.7  0.7 - 4.0 K/uL Final  . Monocytes Absolute 02/05/2014 1.4* 0.1 - 1.0 K/uL Final  . Eosinophils Absolute 02/05/2014 0.1  0.0 - 0.7 K/uL Final  . Basophils Absolute 02/05/2014  0.0  0.0 - 0.1 K/uL Final  . RBC Morphology 02/05/2014 RARE NRBCs   Final  . WBC Morphology 02/05/2014 WHITE COUNT CONFIRMED ON SMEAR   Final   Comment: ATYPICAL LYMPHOCYTES                          INCREASED BANDS (>20% BANDS)  . Smear Review 02/05/2014 PLATELET COUNT CONFIRMED BY SMEAR   Final   Comment: LARGE PLATELETS PRESENT                          GIANT PLATELETS SEEN  . Troponin I 02/05/2014 <0.30  <0.30 ng/mL Final   Comment:                                 Due to the release kinetics of cTnI,                          a negative result within the first hours                          of the onset of symptoms does not rule out                          myocardial infarction with certainty.                          If myocardial infarction is still suspected,                          repeat the test at appropriate intervals.  . Troponin I  02/05/2014 <0.30  <0.30 ng/mL Final   Comment:                                 Due to the release kinetics of cTnI,                          a negative result within the first hours                          of the onset of symptoms does not rule out                          myocardial infarction with certainty.                          If myocardial infarction is still suspected,                          repeat the test at appropriate intervals.    PATHOLOGY: No new pathology.  Urinalysis    Component Value Date/Time   COLORURINE STRAW* 01/04/2014 [place] 01/04/2014 1415   LABSPEC 1.010 01/04/2014 1415   PHURINE 5.5 01/04/2014 1415   GLUCOSEU NEGATIVE 01/04/2014 1415   HGBUR SMALL* 01/04/2014 1415   BILIRUBINUR NEGATIVE 01/04/2014 1415   KETONESUR NEGATIVE 01/04/2014 1415   PROTEINUR NEGATIVE 01/04/2014 1415  UROBILINOGEN 0.2 01/04/2014 1415   NITRITE NEGATIVE 01/04/2014 1415   LEUKOCYTESUR NEGATIVE 01/04/2014 1415    RADIOGRAPHIC STUDIES: Dg Chest Portable 1 View  02/05/2014   CLINICAL DATA:  Chest pain.  Hypertension, asthma.  EXAM: PORTABLE CHEST - 1 VIEW  COMPARISON:  12/26/2013  FINDINGS: Mild cardiomegaly. Lungs are clear. No effusions. No acute bony abnormality.  IMPRESSION: Mild cardiomegaly.  No active disease.   Electronically Signed   By: Rolm Baptise M.D.   On: 02/05/2014 12:47    ASSESSMENT:  #1. Chronic immune thrombocytopenia, requiring 1 mg of dexamethasone daily, status post splenectomy, decrease in platelet count from April probably due to allergen exposure and activation of the immune system. #2. 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  #3. Hypertension, controlled.  #4. Diabetes mellitus, type II, insulin requiring, controlled. Fasting blood sugars in the 110 mg percent range  #5. High index of suspicion for carpal tunnel syndrome on the left.    PLAN:  #1. Continue  dexamethasone 1 mg daily. #2. Followup in 3 months with CBC.   All questions were answered. The patient knows to call the clinic with any problems, questions or concerns. We can certainly see the patient much sooner if necessary.   I spent 25 minutes counseling the patient face to face. The total time spent in the appointment was 30 minutes.    Farrel Gobble, MD 02/15/2014 11:05 AM  DISCLAIMER:  This note was dictated with voice recognition software.  Similar sounding words can inadvertently be transcribed inaccurately and may not be corrected upon review.  h

## 2014-02-22 ENCOUNTER — Encounter (HOSPITAL_COMMUNITY): Payer: Medicare Other

## 2014-02-22 ENCOUNTER — Inpatient Hospital Stay (HOSPITAL_COMMUNITY): Admission: RE | Admit: 2014-02-22 | Payer: Medicare Other | Source: Ambulatory Visit

## 2014-03-09 ENCOUNTER — Telehealth: Payer: Self-pay | Admitting: *Deleted

## 2014-03-09 NOTE — Telephone Encounter (Signed)
Patient responded to email survey with request to reschedule Lexiscan.  Message/request forwarded to Kerin Ransom in the [place] office.

## 2014-03-20 ENCOUNTER — Encounter (HOSPITAL_COMMUNITY)
Admission: RE | Admit: 2014-03-20 | Discharge: 2014-03-20 | Disposition: A | Discharge status: Home / Self Care | Payer: Medicare Other | Source: Ambulatory Visit | Attending: Physician Assistant | Admitting: Physician Assistant

## 2014-03-20 ENCOUNTER — Encounter (HOSPITAL_COMMUNITY): Payer: Self-pay

## 2014-03-20 ENCOUNTER — Ambulatory Visit (HOSPITAL_COMMUNITY)
Admission: RE | Admit: 2014-03-20 | Discharge: 2014-03-20 | Disposition: A | Discharge status: Home / Self Care | Payer: Medicare Other | Source: Ambulatory Visit | Attending: Physician Assistant | Admitting: Physician Assistant

## 2014-03-20 DIAGNOSIS — R0602 Shortness of breath: Secondary | ICD-10-CM | POA: Insufficient documentation

## 2014-03-20 DIAGNOSIS — R079 Chest pain, unspecified: Secondary | ICD-10-CM | POA: Insufficient documentation

## 2014-03-20 DIAGNOSIS — I10 Essential (primary) hypertension: Secondary | ICD-10-CM | POA: Diagnosis not present

## 2014-03-20 DIAGNOSIS — R42 Dizziness and giddiness: Secondary | ICD-10-CM | POA: Diagnosis not present

## 2014-03-20 MED ORDER — TECHNETIUM TC 99M SESTAMIBI - CARDIOLITE
30.0000 | Freq: Once | INTRAVENOUS | Status: AC | PRN
  Administered 2014-03-20: 12:00:00 30 via INTRAVENOUS

## 2014-03-20 MED ORDER — SODIUM CHLORIDE 0.9 % IJ SOLN
10.0000 mL | INTRAMUSCULAR | Status: DC | PRN
  Administered 2014-03-20: 10 mL via INTRAVENOUS

## 2014-03-20 MED ORDER — TECHNETIUM TC 99M SESTAMIBI GENERIC - CARDIOLITE
10.0000 | Freq: Once | INTRAVENOUS | Status: AC | PRN
  Administered 2014-03-20: 10 via INTRAVENOUS

## 2014-03-20 MED ORDER — REGADENOSON 0.4 MG/5ML IV SOLN
0.4000 mg | Freq: Once | INTRAVENOUS | Status: AC | PRN
  Administered 2014-03-20: 0.4 mg via INTRAVENOUS

## 2014-03-20 MED ORDER — REGADENOSON 0.4 MG/5ML IV SOLN
INTRAVENOUS | Status: AC
  Administered 2014-03-20: 0.4 mg via INTRAVENOUS
  Filled 2014-03-20: qty 5

## 2014-03-20 MED ORDER — SODIUM CHLORIDE 0.9 % IJ SOLN
INTRAMUSCULAR | Status: AC
  Administered 2014-03-20: 10 mL via INTRAVENOUS
  Filled 2014-03-20: qty 10

## 2014-03-20 NOTE — Progress Notes (Signed)
Stress Lab Nurses Notes - Summer Cook  Summer Cook 03/20/2014 Reason for doing test: Chest Pain Type of test: Wille Glaser Nurse performing test: Gerrit Halls, RN Nuclear Medicine Tech: Melburn Hake Echo Tech: Not Applicable MD performing test: Koneswaran/K.Lawrence NP Family [id number] Test explained and consent signed: yes IV started: 22g jelco, Saline lock flushed, No redness or edema and Saline lock started in radiology Symptoms: SOB & dizziness Treatment/Intervention: None Reason test stopped: protocol completed After recovery IV was: Discontinued via X-ray tech and No redness or edema Patient to return to [place]. Med at : 12:10 Patient discharged: Home Patient's Condition upon discharge was: stable Comments: During test BP 136/102 & HR 96.  Recovery BP 123/89 & HR 76.  Symptoms resolved in recovery. Geanie Cooley T

## 2014-03-23 ENCOUNTER — Other Ambulatory Visit (HOSPITAL_COMMUNITY): Payer: Self-pay | Admitting: Oncology

## 2014-03-23 DIAGNOSIS — D693 Immune thrombocytopenic purpura: Secondary | ICD-10-CM

## 2014-03-27 ENCOUNTER — Encounter (HOSPITAL_COMMUNITY): Payer: Medicare Other | Attending: Hematology and Oncology

## 2014-03-27 DIAGNOSIS — D693 Immune thrombocytopenic purpura: Secondary | ICD-10-CM | POA: Insufficient documentation

## 2014-03-27 LAB — CBC
HCT: 50.8 % — ABNORMAL HIGH (ref 36.0–46.0)
HEMOGLOBIN: 16.4 g/dL — AB (ref 12.0–15.0)
MCH: 28.9 pg (ref 26.0–34.0)
MCHC: 32.3 g/dL (ref 30.0–36.0)
MCV: 89.6 fL (ref 78.0–100.0)
Platelets: 113 10*3/uL — ABNORMAL LOW (ref 150–400)
RBC: 5.67 MIL/uL — AB (ref 3.87–5.11)
RDW: 15.1 % (ref 11.5–15.5)
WBC: 10.4 10*3/uL (ref 4.0–10.5)

## 2014-03-27 NOTE — Progress Notes (Signed)
LABS DRAWN FOR CBC 

## 2014-04-02 ENCOUNTER — Ambulatory Visit (INDEPENDENT_AMBULATORY_CARE_PROVIDER_SITE_OTHER): Payer: Medicare Other | Admitting: Cardiology

## 2014-04-02 ENCOUNTER — Encounter: Payer: Self-pay | Admitting: Cardiology

## 2014-04-02 VITALS — BP 115/86 | HR 84 | Ht 65.0 in | Wt 203.4 lb

## 2014-04-02 DIAGNOSIS — I10 Essential (primary) hypertension: Secondary | ICD-10-CM

## 2014-04-02 DIAGNOSIS — R072 Precordial pain: Secondary | ICD-10-CM

## 2014-04-02 NOTE — Patient Instructions (Signed)
Your physician wants you to follow-up in: 6 months You will receive a reminder letter in the mail two months in advance. If you don't receive a letter, please call our office to schedule the follow-up appointment.     Your physician recommends that you continue on your current medications as directed. Please refer to the Current Medication list given to you today.      Thank you for choosing [facility] !        

## 2014-04-02 NOTE — Assessment & Plan Note (Signed)
Resolved. Recent Lexiscan Cardiolite was normal as outlined above.

## 2014-04-02 NOTE — Progress Notes (Signed)
Clinical Summary Summer Cook is a 48 y.o.female most recently seen in the office by Ms. Summer Cook in May of this year. She returns for a routine visit. She reports no significant chest pain symptoms, states she has been doing well other than being under stress in association with her 48 year old daughter. She reports compliance with her medications, has been doing well on beta blocker.  Lexiscan Cardiolite in May of this year showed no evidence of ischemia or scar with LVEF 72%. We reviewed the results today.  Recent lab work noted including hemoglobin 16.4, platelets 113, troponin I less than 0.30, potassium 3.3, BUN 26, creatinine 1.4.  Allergies  Allergen Reactions  . Yellow Jacket Venom Swelling    Current Outpatient Prescriptions  Medication Sig Dispense Refill  . albuterol (PROVENTIL HFA;VENTOLIN HFA) 108 (90 BASE) MCG/ACT inhaler Inhale 2 puffs into the lungs every 6 (six) hours as needed for wheezing or shortness of breath.       . ALPRAZolam (XANAX) 1 MG tablet Take 1 mg by mouth 3 (three) times daily as needed for anxiety.       . calcitonin, salmon, (MIACALCIN/FORTICAL) 200 UNIT/ACT nasal spray Place 1 spray into alternate nostrils daily.      Marland Kitchen dexamethasone (DECADRON) 0.5 MG tablet Take 0.5 mg by mouth 2 (two) times daily.       . furosemide (LASIX) 20 MG tablet Take 20 mg by mouth daily.      Marland Kitchen HYDROcodone Bitartrate ER (HYSINGLA ER) 60 MG T24A Take 1 tablet by mouth daily.      . insulin aspart (NOVOLOG) 100 UNIT/ML injection Inject 2-5 Units into the skin daily as needed for high blood sugar. Per sliding scale.      Marland Kitchen ipratropium-albuterol (DUONEB) 0.5-2.5 (3) MG/3ML SOLN Take 3 mLs by nebulization 3 (three) times daily as needed (shorntess of breath/wheezing).       . metoprolol succinate (TOPROL-XL) 50 MG 24 hr tablet TAKE 1 TABLET EVERY DAY  90 tablet  3  . mometasone (NASONEX) 50 MCG/ACT nasal spray Place 2 sprays into the nose daily.      . montelukast (SINGULAIR)  10 MG tablet Take 10 mg by mouth daily.      . Multiple Vitamins-Minerals (MULTIVITAMIN PO) Take 1 capsule by mouth daily.       . sertraline (ZOLOFT) 50 MG tablet Take 150 mg by mouth daily.       Marland Kitchen triamterene-hydrochlorothiazide (DYAZIDE) 37.5-25 MG per capsule Take 1 capsule by mouth every morning.       . vitamin B-12 (CYANOCOBALAMIN) 1000 MCG tablet Take 1,000 mcg by mouth daily.      . [DISCONTINUED] prochlorperazine (COMPAZINE) 10 MG tablet Take 10 mg by mouth as needed. For nausea        No current facility-administered medications for this visit.    Past Medical History  Diagnosis Date  . Essential hypertension, benign   . 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  Last was Prednisone 40mg ; 2 wk in 10/2012.  She also was on Prednisone bridged with Cellcept briefly but stopped due to lack of insurance.   . Depression   . Thrush, oral 05/29/2011  . Steroid-induced diabetes   . Pulmonary embolism 04/2012  . Asthma   . Renal insufficiency     Social History Summer Cook reports that she quit smoking about 5 years ago. Her smoking use included Cigarettes.  She smoked 0.00 packs per day for 0 years. She has never used smokeless tobacco. Summer Cook reports that she does not drink alcohol.  Review of Systems No regular sense of palpitations or chest pain. Stable appetite. She has been trying to lose weight. Psychosocial stressors as noted. Other systems reviewed negative.  Physical Examination Filed Vitals:   04/02/14 1441  BP: 115/86  Pulse: 84   Filed Weights   04/02/14 1441  Weight: 203 lb 6.4 oz (92.262 kg)    Overweight, no distress.  HEENT: Conjunctiva and lids normal, oropharynx clear.  Neck: Supple, no obvious elevated JVP or carotid bruits, no thyromegaly.  Lungs: Clear to auscultation, nonlabored breathing at rest.  Cardiac: Regular rate and rhythm, no  significant systolic murmur, no pericardial rub.  Abdomen: Soft, nontender, bowel sounds present, no guarding or rebound.  Extremities: Chronic appearing mild edema, distal pulses 1-2+.  Skin: Mildly diaphoretic.  Musculoskeletal: No kyphosis.    Problem List and Plan   Chest pain Resolved. Recent Lexiscan Cardiolite was normal as outlined above.  Hypertension Blood pressure is normal today. Keep followup with Summer Cook.    Summer Cook, M.D., F.A.C.C.

## 2014-04-02 NOTE — Assessment & Plan Note (Signed)
Blood pressure is normal today. Keep followup with [name].

## 2014-04-15 ENCOUNTER — Other Ambulatory Visit: Payer: Self-pay | Admitting: Hematology & Oncology

## 2014-04-28 ENCOUNTER — Encounter (HOSPITAL_COMMUNITY): Payer: Self-pay | Admitting: Emergency Medicine

## 2014-04-28 ENCOUNTER — Other Ambulatory Visit: Payer: Self-pay

## 2014-04-28 ENCOUNTER — Emergency Department (HOSPITAL_COMMUNITY): Payer: Medicare Other

## 2014-04-28 ENCOUNTER — Inpatient Hospital Stay (HOSPITAL_COMMUNITY)
Admission: EM | Admit: 2014-04-28 | Discharge: 2014-04-29 | DRG: 683 | Disposition: A | Discharge status: Home / Self Care | Payer: Medicare Other | Attending: Internal Medicine | Admitting: Internal Medicine

## 2014-04-28 DIAGNOSIS — Z803 Family history of malignant neoplasm of breast: Secondary | ICD-10-CM

## 2014-04-28 DIAGNOSIS — Z794 Long term (current) use of insulin: Secondary | ICD-10-CM | POA: Diagnosis not present

## 2014-04-28 DIAGNOSIS — N183 Chronic kidney disease, stage 3 unspecified: Secondary | ICD-10-CM | POA: Diagnosis present

## 2014-04-28 DIAGNOSIS — Z8049 Family history of malignant neoplasm of other genital organs: Secondary | ICD-10-CM | POA: Diagnosis not present

## 2014-04-28 DIAGNOSIS — I10 Essential (primary) hypertension: Secondary | ICD-10-CM | POA: Diagnosis present

## 2014-04-28 DIAGNOSIS — E119 Type 2 diabetes mellitus without complications: Secondary | ICD-10-CM | POA: Diagnosis present

## 2014-04-28 DIAGNOSIS — N179 Acute kidney failure, unspecified: Principal | ICD-10-CM | POA: Diagnosis present

## 2014-04-28 DIAGNOSIS — Z800 Family history of malignant neoplasm of digestive organs: Secondary | ICD-10-CM | POA: Diagnosis not present

## 2014-04-28 DIAGNOSIS — D693 Immune thrombocytopenic purpura: Secondary | ICD-10-CM | POA: Diagnosis present

## 2014-04-28 DIAGNOSIS — Z87891 Personal history of nicotine dependence: Secondary | ICD-10-CM

## 2014-04-28 DIAGNOSIS — R079 Chest pain, unspecified: Secondary | ICD-10-CM | POA: Diagnosis not present

## 2014-04-28 DIAGNOSIS — I129 Hypertensive chronic kidney disease with stage 1 through stage 4 chronic kidney disease, or unspecified chronic kidney disease: Secondary | ICD-10-CM | POA: Diagnosis present

## 2014-04-28 DIAGNOSIS — E876 Hypokalemia: Secondary | ICD-10-CM | POA: Diagnosis present

## 2014-04-28 DIAGNOSIS — Z801 Family history of malignant neoplasm of trachea, bronchus and lung: Secondary | ICD-10-CM | POA: Diagnosis not present

## 2014-04-28 DIAGNOSIS — E860 Dehydration: Secondary | ICD-10-CM | POA: Diagnosis present

## 2014-04-28 DIAGNOSIS — R109 Unspecified abdominal pain: Secondary | ICD-10-CM | POA: Diagnosis not present

## 2014-04-28 DIAGNOSIS — Z86711 Personal history of pulmonary embolism: Secondary | ICD-10-CM

## 2014-04-28 DIAGNOSIS — IMO0002 Reserved for concepts with insufficient information to code with codable children: Secondary | ICD-10-CM

## 2014-04-28 DIAGNOSIS — J45909 Unspecified asthma, uncomplicated: Secondary | ICD-10-CM | POA: Diagnosis present

## 2014-04-28 DIAGNOSIS — R0902 Hypoxemia: Secondary | ICD-10-CM | POA: Diagnosis not present

## 2014-04-28 DIAGNOSIS — R072 Precordial pain: Secondary | ICD-10-CM | POA: Diagnosis not present

## 2014-04-28 DIAGNOSIS — R112 Nausea with vomiting, unspecified: Secondary | ICD-10-CM | POA: Diagnosis not present

## 2014-04-28 LAB — COMPREHENSIVE METABOLIC PANEL
ALBUMIN: 3.6 g/dL (ref 3.5–5.2)
ALK PHOS: 57 U/L (ref 39–117)
ALT: 44 U/L — AB (ref 0–35)
AST: 39 U/L — ABNORMAL HIGH (ref 0–37)
Anion gap: 16 — ABNORMAL HIGH (ref 5–15)
BUN: 40 mg/dL — ABNORMAL HIGH (ref 6–23)
CO2: 31 mEq/L (ref 19–32)
Calcium: 9.8 mg/dL (ref 8.4–10.5)
Chloride: 93 mEq/L — ABNORMAL LOW (ref 96–112)
Creatinine, Ser: 1.78 mg/dL — ABNORMAL HIGH (ref 0.50–1.10)
GFR calc Af Amer: 38 mL/min — ABNORMAL LOW (ref >90)
GFR calc non Af Amer: 33 mL/min — ABNORMAL LOW (ref >90)
Glucose, Bld: 185 mg/dL — ABNORMAL HIGH (ref 70–99)
Potassium: 2.5 mEq/L — CL (ref 3.7–5.3)
SODIUM: 140 meq/L (ref 137–147)
TOTAL PROTEIN: 6.8 g/dL (ref 6.0–8.3)
Total Bilirubin: 0.4 mg/dL (ref 0.3–1.2)

## 2014-04-28 LAB — URINALYSIS, ROUTINE W REFLEX MICROSCOPIC
BILIRUBIN URINE: NEGATIVE
Glucose, UA: NEGATIVE mg/dL
KETONES UR: NEGATIVE mg/dL
Nitrite: NEGATIVE
PROTEIN: NEGATIVE mg/dL
Specific Gravity, Urine: 1.015 (ref 1.005–1.030)
UROBILINOGEN UA: 0.2 mg/dL (ref 0.0–1.0)
pH: 7 (ref 5.0–8.0)

## 2014-04-28 LAB — RAPID URINE DRUG SCREEN, HOSP PERFORMED
AMPHETAMINES: NOT DETECTED
Barbiturates: NOT DETECTED
Benzodiazepines: POSITIVE — AB
Cocaine: NOT DETECTED
Opiates: NOT DETECTED
TETRAHYDROCANNABINOL: NOT DETECTED

## 2014-04-28 LAB — TROPONIN I

## 2014-04-28 LAB — CBC WITH DIFFERENTIAL/PLATELET
BASOS ABS: 0.1 10*3/uL (ref 0.0–0.1)
Basophils Relative: 1 % (ref 0–1)
EOS PCT: 2 % (ref 0–5)
Eosinophils Absolute: 0.2 10*3/uL (ref 0.0–0.7)
HEMATOCRIT: 48.6 % — AB (ref 36.0–46.0)
Hemoglobin: 15.8 g/dL — ABNORMAL HIGH (ref 12.0–15.0)
LYMPHS PCT: 21 % (ref 12–46)
Lymphs Abs: 2 10*3/uL (ref 0.7–4.0)
MCH: 28.2 pg (ref 26.0–34.0)
MCHC: 32.5 g/dL (ref 30.0–36.0)
MCV: 86.8 fL (ref 78.0–100.0)
MONO ABS: 1.2 10*3/uL — AB (ref 0.1–1.0)
MONOS PCT: 13 % — AB (ref 3–12)
Neutro Abs: 6.2 10*3/uL (ref 1.7–7.7)
Neutrophils Relative %: 64 % (ref 43–77)
Platelets: 116 10*3/uL — ABNORMAL LOW (ref 150–400)
RBC: 5.6 MIL/uL — ABNORMAL HIGH (ref 3.87–5.11)
RDW: 14.8 % (ref 11.5–15.5)
WBC: 9.7 10*3/uL (ref 4.0–10.5)

## 2014-04-28 LAB — MAGNESIUM: Magnesium: 2.3 mg/dL (ref 1.5–2.5)

## 2014-04-28 LAB — PREGNANCY, URINE: Preg Test, Ur: NEGATIVE

## 2014-04-28 LAB — URINE MICROSCOPIC-ADD ON

## 2014-04-28 LAB — LIPASE, BLOOD: LIPASE: 142 U/L — AB (ref 11–59)

## 2014-04-28 MED ORDER — SODIUM CHLORIDE 0.9 % IV SOLN: INTRAVENOUS | Status: DC

## 2014-04-28 MED ORDER — ACETAMINOPHEN 325 MG PO TABS: 650.0000 mg | ORAL_TABLET | Freq: Four times a day (QID) | ORAL | Status: DC | PRN

## 2014-04-28 MED ORDER — POTASSIUM CHLORIDE CRYS ER 20 MEQ PO TBCR
40.0000 meq | EXTENDED_RELEASE_TABLET | ORAL | Status: AC
  Administered 2014-04-28 – 2014-04-29 (×2): 40 meq via ORAL
  Filled 2014-04-28: qty 2

## 2014-04-28 MED ORDER — ALPRAZOLAM 1 MG PO TABS
1.0000 mg | ORAL_TABLET | Freq: Three times a day (TID) | ORAL | Status: DC | PRN
  Administered 2014-04-28: 1 mg via ORAL
  Filled 2014-04-28: qty 1

## 2014-04-28 MED ORDER — ACETAMINOPHEN 650 MG RE SUPP: 650.0000 mg | Freq: Four times a day (QID) | RECTAL | Status: DC | PRN

## 2014-04-28 MED ORDER — POTASSIUM CHLORIDE 10 MEQ/100ML IV SOLN
10.0000 meq | Freq: Once | INTRAVENOUS | Status: AC
  Administered 2014-04-28: 10 meq via INTRAVENOUS
  Filled 2014-04-28: qty 100

## 2014-04-28 MED ORDER — HYDROCODONE-ACETAMINOPHEN 5-325 MG PO TABS
1.0000 | ORAL_TABLET | ORAL | Status: DC | PRN
  Administered 2014-04-29: 1 via ORAL
  Administered 2014-04-29: 2 via ORAL
  Filled 2014-04-28: qty 2
  Filled 2014-04-28: qty 1

## 2014-04-28 MED ORDER — CALCITONIN (SALMON) 200 UNIT/ACT NA SOLN
1.0000 | Freq: Every day | NASAL | Status: DC
  Administered 2014-04-29: 1 via NASAL
  Filled 2014-04-28: qty 3.7

## 2014-04-28 MED ORDER — IPRATROPIUM-ALBUTEROL 0.5-2.5 (3) MG/3ML IN SOLN: 3.0000 mL | Freq: Three times a day (TID) | RESPIRATORY_TRACT | Status: DC | PRN

## 2014-04-28 MED ORDER — ONDANSETRON HCL 4 MG/2ML IJ SOLN: 4.0000 mg | Freq: Four times a day (QID) | INTRAMUSCULAR | Status: DC | PRN

## 2014-04-28 MED ORDER — INSULIN ASPART 100 UNIT/ML ~~LOC~~ SOLN: 0.0000 [IU] | Freq: Every day | SUBCUTANEOUS | Status: DC

## 2014-04-28 MED ORDER — ONDANSETRON HCL 4 MG PO TABS: 4.0000 mg | ORAL_TABLET | Freq: Four times a day (QID) | ORAL | Status: DC | PRN

## 2014-04-28 MED ORDER — SODIUM CHLORIDE 0.9 % IV SOLN
INTRAVENOUS | Status: DC
  Administered 2014-04-28: 12:00:00 via INTRAVENOUS

## 2014-04-28 MED ORDER — ALBUTEROL SULFATE (2.5 MG/3ML) 0.083% IN NEBU: 2.5000 mg | INHALATION_SOLUTION | RESPIRATORY_TRACT | Status: DC | PRN

## 2014-04-28 MED ORDER — DEXAMETHASONE 0.5 MG PO TABS
0.5000 mg | ORAL_TABLET | Freq: Two times a day (BID) | ORAL | Status: DC
  Administered 2014-04-28 – 2014-04-29 (×2): 0.5 mg via ORAL
  Filled 2014-04-28 (×4): qty 1

## 2014-04-28 MED ORDER — POTASSIUM CHLORIDE IN NACL 40-0.9 MEQ/L-% IV SOLN
INTRAVENOUS | Status: DC
Start: 2014-04-28 — End: 2014-04-29
  Administered 2014-04-28 – 2014-04-29 (×2): 125 mL/h via INTRAVENOUS

## 2014-04-28 MED ORDER — MONTELUKAST SODIUM 10 MG PO TABS
10.0000 mg | ORAL_TABLET | Freq: Every day | ORAL | Status: DC
  Administered 2014-04-29: 10 mg via ORAL
  Filled 2014-04-28: qty 1

## 2014-04-28 MED ORDER — VITAMIN B-12 1000 MCG PO TABS
1000.0000 ug | ORAL_TABLET | Freq: Every day | ORAL | Status: DC
  Administered 2014-04-29: 1000 ug via ORAL
  Filled 2014-04-28: qty 1

## 2014-04-28 MED ORDER — FLUTICASONE PROPIONATE 50 MCG/ACT NA SUSP
2.0000 | Freq: Every day | NASAL | Status: DC
Start: 2014-04-29 — End: 2014-04-29
  Administered 2014-04-29: 2 via NASAL
  Filled 2014-04-28: qty 16

## 2014-04-28 MED ORDER — METOPROLOL SUCCINATE ER 50 MG PO TB24
50.0000 mg | ORAL_TABLET | Freq: Every day | ORAL | Status: DC
  Administered 2014-04-29: 50 mg via ORAL
  Filled 2014-04-28: qty 1

## 2014-04-28 MED ORDER — INSULIN ASPART 100 UNIT/ML ~~LOC~~ SOLN: 0.0000 [IU] | Freq: Three times a day (TID) | SUBCUTANEOUS | Status: DC

## 2014-04-28 MED ORDER — SERTRALINE HCL 50 MG PO TABS
150.0000 mg | ORAL_TABLET | Freq: Every day | ORAL | Status: DC
  Administered 2014-04-29: 150 mg via ORAL
  Filled 2014-04-28: qty 3

## 2014-04-28 MED ORDER — SODIUM CHLORIDE 0.9 % IJ SOLN
3.0000 mL | Freq: Two times a day (BID) | INTRAMUSCULAR | Status: DC
  Administered 2014-04-28: 3 mL via INTRAVENOUS

## 2014-04-28 NOTE — H&P (Signed)
[facility] History and Physical  Summer Cook LFY:[id number] DOB: [birth year] DOA: 04/28/2014  Referring physician: Dr. Thurnell Garbe, ER physician PCP: Glo Herring., MD   Chief Complaint: leg pain  HPI: Summer Cook is a 48 y.o. female who was in her usual state of health when she was sitting out in the sun yesterday for a prolonged period of time. She reports that the same evening, she developed aching pain in her legs bilaterally. She reports that this progressively got worse through the night. She slept in a recliner overnight. She had difficulty walking. She feels generally weak and fatigued. She felt this made her low-grade fever. She's not had any significant cough. She does describe some chest tightness and some wheezing, but feels that it may be related to her reactive airway disease. She has chronic diarrhea which she reports that her baseline. She did have 2 episodes of vomiting the last 4 days, but feels that this may be related to acid reflux. She was evaluated in the emergency room where she was noted to have elevated creatinine above her baseline and significant hypokalemia. She was noted to be clinically dehydrated. Patient will be admitted for further treatment.   Review of Systems:  Pertinent positives as per HPI, otherwise negative  Past Medical History  Diagnosis Date  . Essential hypertension, benign   . 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  Last was Prednisone 38m; 2 wk in 10/2012.  She also was on Prednisone bridged with Cellcept briefly but stopped due to lack of insurance.   . Depression   . Thrush, oral 05/29/2011  . Steroid-induced diabetes   . Pulmonary embolism 04/2012  . Asthma   . Renal insufficiency   . Chronic chest pain   . Normal cardiac stress test 02/2014   Past Surgical History  Procedure Laterality Date  . Cholecystectomy  2008   . Bone marrow biopsy    . 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 Colonoscopy with esophagogastroduodenoscopy (egd) N/A 01/04/2013    Dr. RGala Romney esophageal plaques with +KOH, hh. Gastric antrum abnormal, bx reactive gastropathy. Anal canal hemorrhoids, colonic tics, normal TI, single polyp (sessile serrated tubular adenoma). Next TCS 12/2017   Social History:  reports that she quit smoking about 5 years ago. Her smoking use included Cigarettes. She smoked 0.00 packs per day for 0 years. She has never used smokeless tobacco. She reports that she does not drink alcohol or use illicit drugs.  Allergies  Allergen Reactions  . Yellow Jacket Venom Swelling    Family History  Problem Relation Age of Onset  . Cervical cancer Mother   . Lung cancer Father   . Pneumonia Brother   . Cancer Paternal Grandmother 858   colon cancer, breast cancer  . Cancer Paternal Grandfather 619   colon cancer  . AAA (abdominal aortic aneurysm) Paternal Grandmother      Prior to Admission medications   Medication Sig Start Date End Date Taking? Authorizing Provider  albuterol (PROVENTIL HFA;VENTOLIN HFA) 108 (90 BASE) MCG/ACT inhaler Inhale 2 puffs into the lungs every 6 (six) hours as needed for wheezing or shortness of breath.    Yes Historical Provider, MD  ALPRAZolam (Duanne Moron 1 MG tablet Take 1 mg by mouth 3 (three) times daily as needed for anxiety.    Yes Historical Provider, MD  calcitonin, salmon, (MIACALCIN/FORTICAL) 200 UNIT/ACT nasal spray Place 1 spray into alternate nostrils  daily.   Yes Historical Provider, MD  dexamethasone (DECADRON) 0.5 MG tablet Take 0.5 mg by mouth 2 (two) times daily.    Yes Historical Provider, MD  furosemide (LASIX) 20 MG tablet Take 20 mg by mouth daily.   Yes Historical Provider, MD  HYDROcodone Bitartrate ER (HYSINGLA ER) 60 MG T24A Take 60 mg by mouth daily.    Yes Historical Provider, MD  insulin aspart (NOVOLOG) 100 UNIT/ML injection Inject 2-5 Units into the skin daily as needed  for high blood sugar. Per sliding scale.   Yes Historical Provider, MD  ipratropium-albuterol (DUONEB) 0.5-2.5 (3) MG/3ML SOLN Take 3 mLs by nebulization 3 (three) times daily as needed (shorntess of breath/wheezing).  03/22/13  Yes Historical Provider, MD  metoprolol succinate (TOPROL-XL) 50 MG 24 hr tablet Take 50 mg by mouth daily. Take with or immediately following a meal.   Yes Historical Provider, MD  mometasone (NASONEX) 50 MCG/ACT nasal spray Place 2 sprays into the nose daily.   Yes Historical Provider, MD  montelukast (SINGULAIR) 10 MG tablet Take 10 mg by mouth daily.   Yes Historical Provider, MD  Multiple Vitamin (MULTIVITAMIN WITH MINERALS) TABS tablet Take 1 tablet by mouth daily.   Yes Historical Provider, MD  sertraline (ZOLOFT) 50 MG tablet Take 150 mg by mouth daily.    Yes Historical Provider, MD  triamterene-hydrochlorothiazide (DYAZIDE) 37.5-25 MG per capsule Take 1 capsule by mouth every morning.    Yes Historical Provider, MD  vitamin B-12 (CYANOCOBALAMIN) 1000 MCG tablet Take 1,000 mcg by mouth daily.   Yes Historical Provider, MD   Physical Exam: Filed Vitals:   04/28/14 1118 04/28/14 1421 04/28/14 1556  BP: 123/87 123/82 117/86  Pulse:  91 84  Temp: 98.2 F (36.8 C)  97.8 F (36.6 C)  TempSrc:   Axillary  Resp: _0 Height: 5' 5" (1.651 m)    Weight: 91.627 kg (202 lb)    SpO2: 95% 96% 95%    Wt Readings from Last 3 Encounters:  04/28/14 91.627 kg (202 lb)  04/02/14 92.262 kg (203 lb 6.4 oz)  02/15/14 104.01 kg (229 lb 4.8 oz)    General:  Sleeping on my arrival, awakes to voice and does not appear to be in distress Eyes: PERRL, normal lids, irises & conjunctiva ENT: mucous membranes dry Neck: no LAD, masses or thyromegaly Cardiovascular: RRR, no m/r/g. No LE edema. Telemetry: SR, no arrhythmias  Respiratory: CTA bilaterally, no w/r/r. Normal respiratory effort. Abdomen: soft, mildly tender throughout abdomen, nd, bs+ Skin: no rash or induration  seen on limited exam Musculoskeletal: grossly normal tone BUE/BLE Psychiatric: grossly normal mood and affect, speech fluent and appropriate Neurologic: grossly non-focal.          Labs on Admission:  Basic Metabolic Panel:  Recent Labs Lab 04/28/14 1136  NA 140  K 2.5*  CL 93*  CO2 31  GLUCOSE 185*  BUN 40*  CREATININE 1.78*  CALCIUM 9.8  MG 2.3   Liver Function Tests:  Recent Labs Lab 04/28/14 1136  AST 39*  ALT 44*  ALKPHOS 57  BILITOT 0.4  PROT 6.8  ALBUMIN 3.6    Recent Labs Lab 04/28/14 1136  LIPASE 142*   No results found for this basename: AMMONIA,  in the last 168 hours CBC:  Recent Labs Lab 04/28/14 1136  WBC 9.7  NEUTROABS 6.2  HGB 15.8*  HCT 48.6*  MCV 86.8  PLT 116*   Cardiac Enzymes:  Recent Labs Lab 04/28/14  1136 04/28/14 1943  TROPONINI <0.30 <0.30    BNP (last 3 results) No results found for this basename: PROBNP,  in the last 8760 hours CBG: No results found for this basename: GLUCAP,  in the last 168 hours  Radiological Exams on Admission: Dg Chest Portable 1 View  04/28/2014   CLINICAL DATA:  Mid chest pain  EXAM: PORTABLE CHEST - 1 VIEW  COMPARISON:  02/05/2014  FINDINGS: The heart size remains moderately enlarged without evidence for edema. Both lungs are clear. The visualized skeletal structures are unremarkable.  IMPRESSION: No active disease.   Electronically Signed   By: Conchita Paris M.D.   On: 04/28/2014 12:19   Dg Abd 2 Views  04/28/2014   CLINICAL DATA:  Abdominal pain and weakness.  EXAM: ABDOMEN - 2 VIEW  COMPARISON:  CT abdomen and pelvis 01/21/2013  FINDINGS: Right upper quadrant surgical clips are noted. There is a small to moderate amount of stool present in the colon. There is a paucity of small bowel gas. There is mild thoracolumbar levoscoliosis.  IMPRESSION: Small to moderate amount of colonic stool. Paucity of small bowel gas, limiting evaluation for obstruction.   Electronically Signed   By: Logan Bores   On: 04/28/2014 12:55    EKG: Independently reviewed. No acute changes  Assessment/Plan Active Problems:   Hypertension   ITP (idiopathic thrombocytopenic purpura)   Hypokalemia   Dehydration   Acute renal failure   CKD (chronic kidney disease) stage 3, GFR 30-59 ml/min   1. Bilateral leg pain. Likely related to dehydration and electrolyte abnormalities. We'll continue to follow once electrolytes have been corrected 2. Dehydration. Continue with IV fluids. Likely related to spending a prolonged amount of time in the sun 3. Acute on chronic kidney disease stage III. Creatinine is about baseline. Likely related to volume depletion. Continue with IV fluids and monitor urine output. Check CK levels to evaluate for possible rhabdomyolysis 4. Hypokalemia. Likely related to volume depletion/diarrhea. Replace 5. Chronic diarrhea/abdominal pain. Patient plans to followup with GI as an outpatient. 6. Elevated lipase in the setting of renal dysfunction. Unclear clinical significance. The patient appears to be have been tolerating a diet. She does not have any real significant vomiting. We'll continue to treat supportively. 7. History of ITP. Platelet count appears to be near baseline. Continue chronic steroids.   Code Status: full code DVT Prophylaxis: SCD Family Communication: discussed with patient Disposition Plan: discharge home once improved  Time spent: 80mins  Serrena Linderman [facility] Pager [phone]  **Disclaimer: This note may have been dictated with voice recognition software. Similar sounding words can inadvertently be transcribed and this note may contain transcription errors which may not have been corrected upon publication of note.**

## 2014-04-28 NOTE — ED Provider Notes (Signed)
CSN: [id number]     Arrival date & time 04/28/14  1105 History   First MD Initiated Contact with Patient 04/28/14 1130     Chief Complaint  Patient presents with  . Chest Pain  . Abdominal Pain  . Emesis  . Fatigue      HPI Pt was seen at 1140.  Per pt, c/o gradual onset and persistence of constant multiple symptoms for the past 3 days. Symptoms include: constant mid-sternal chest "pain," upper abd "pain," generalized weakness/fatigue, bilat LE's muscles "cramping," as well as multiple intermittent episodes of N/V. Family states pt "seems more tired than usual." Pt and family state pt's symptoms worsened yesterday "when she sat outside in the hot sun all day."  Denies diarrhea, no fevers, no back pain, no rash, no palpitations, no cough/SOB, no black or blood in stools or emesis.       Past Medical History  Diagnosis Date  . Essential hypertension, benign   . 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  Last was Prednisone 40mg ; 2 wk in 10/2012.  She also was on Prednisone bridged with Cellcept briefly but stopped due to lack of insurance.   . Depression   . Thrush, oral 05/29/2011  . Steroid-induced diabetes   . Pulmonary embolism 04/2012  . Asthma   . Renal insufficiency   . Chronic chest pain   . Normal cardiac stress test 02/2014   Past Surgical History  Procedure Laterality Date  . Cholecystectomy  2008  . Bone marrow biopsy    . 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 Colonoscopy with esophagogastroduodenoscopy (egd) N/A 01/04/2013    Dr. 03/06/2013: esophageal plaques with +KOH, hh. Gastric antrum abnormal, bx reactive gastropathy. Anal canal hemorrhoids, colonic tics, normal TI, single polyp (sessile serrated tubular adenoma). Next TCS 12/2017   Family History  Problem Relation Age of Onset  . Cervical cancer Mother   . Lung cancer Father   . Pneumonia Brother   . Cancer Paternal  Grandmother 38    colon cancer, breast cancer  . Cancer Paternal Grandfather 15    colon cancer  . AAA (abdominal aortic aneurysm) Paternal Grandmother    History  Substance Use Topics  . Smoking status: Former Smoker -- 0 years    Types: Cigarettes    Quit date: 02/14/2009  . Smokeless tobacco: Never Used  . Alcohol Use: No    Review of Systems ROS: Statement: All systems negative except as marked or noted in the HPI; Constitutional: Negative for fever and chills. +generalized weakness/fatigue. ; ; Eyes: Negative for eye pain, redness and discharge. ; ; ENMT: Negative for ear pain, hoarseness, nasal congestion, sinus pressure and sore throat. ; ; Cardiovascular: Negative for chest pain, palpitations, diaphoresis, dyspnea and peripheral edema. ; ; Respiratory: Negative for cough, wheezing and stridor. ; ; Gastrointestinal: +N/V, abd pain. Negative for diarrhea, blood in stool, hematemesis, jaundice and rectal bleeding. . ; ; Genitourinary: Negative for dysuria, flank pain and hematuria. ; ; Musculoskeletal: +legs "cramping." Negative for back pain and neck pain. Negative for swelling and trauma.; ; Skin: Negative for pruritus, rash, abrasions, blisters, bruising and skin lesion.; ; Neuro: Negative for headache, lightheadedness and neck stiffness. Negative for weakness, altered level of consciousness , altered mental status, extremity weakness, paresthesias, involuntary movement, seizure and syncope.      Allergies  Yellow jacket venom  Home Medications   Prior to Admission medications  Medication Sig Start Date End Date Taking? Authorizing Provider  albuterol (PROVENTIL HFA;VENTOLIN HFA) 108 (90 BASE) MCG/ACT inhaler Inhale 2 puffs into the lungs every 6 (six) hours as needed for wheezing or shortness of breath.    Yes Historical Provider, MD  ALPRAZolam Duanne Moron) 1 MG tablet Take 1 mg by mouth 3 (three) times daily as needed for anxiety.    Yes Historical Provider, MD  calcitonin,  salmon, (MIACALCIN/FORTICAL) 200 UNIT/ACT nasal spray Place 1 spray into alternate nostrils daily.   Yes Historical Provider, MD  dexamethasone (DECADRON) 0.5 MG tablet Take 0.5 mg by mouth 2 (two) times daily.    Yes Historical Provider, MD  furosemide (LASIX) 20 MG tablet Take 20 mg by mouth daily.   Yes Historical Provider, MD  HYDROcodone Bitartrate ER (HYSINGLA ER) 60 MG T24A Take 60 mg by mouth daily.    Yes Historical Provider, MD  insulin aspart (NOVOLOG) 100 UNIT/ML injection Inject 2-5 Units into the skin daily as needed for high blood sugar. Per sliding scale.   Yes Historical Provider, MD  ipratropium-albuterol (DUONEB) 0.5-2.5 (3) MG/3ML SOLN Take 3 mLs by nebulization 3 (three) times daily as needed (shorntess of breath/wheezing).  03/22/13  Yes Historical Provider, MD  metoprolol succinate (TOPROL-XL) 50 MG 24 hr tablet Take 50 mg by mouth daily. Take with or immediately following a meal.   Yes Historical Provider, MD  mometasone (NASONEX) 50 MCG/ACT nasal spray Place 2 sprays into the nose daily.   Yes Historical Provider, MD  montelukast (SINGULAIR) 10 MG tablet Take 10 mg by mouth daily.   Yes Historical Provider, MD  Multiple Vitamin (MULTIVITAMIN WITH MINERALS) TABS tablet Take 1 tablet by mouth daily.   Yes Historical Provider, MD  sertraline (ZOLOFT) 50 MG tablet Take 150 mg by mouth daily.    Yes Historical Provider, MD  triamterene-hydrochlorothiazide (DYAZIDE) 37.5-25 MG per capsule Take 1 capsule by mouth every morning.    Yes Historical Provider, MD  vitamin B-12 (CYANOCOBALAMIN) 1000 MCG tablet Take 1,000 mcg by mouth daily.   Yes Historical Provider, MD   BP 123/82  Pulse 91  Temp(Src) 98.2 F (36.8 C)  Resp 14  Ht $R'5\' 5"'wJ$  (1.651 m)  Wt 202 lb (91.627 kg)  BMI 33.61 kg/m2  SpO2 96%  LMP 05/13/2011 Physical Exam 1145: Physical examination:  Nursing notes reviewed; Vital signs and O2 SAT reviewed;  Constitutional: Well developed, Well nourished, In no acute distress;  Head:  Normocephalic, atraumatic; Eyes: EOMI, PERRL, No scleral icterus; ENMT: Mouth and pharynx normal, Mucous membranes dry; Neck: Supple, Full range of motion, No lymphadenopathy; Cardiovascular: Regular rate and rhythm, No murmur, rub, or gallop; Respiratory: Breath sounds clear & equal bilaterally, No rales, rhonchi, wheezes.  Speaking full sentences with ease, Normal respiratory effort/excursion; Chest: Nontender, Movement normal; Abdomen: Soft, +mild mid-epigastric tenderness to palp. No rebound or guarding. Nondistended, Normal bowel sounds; Genitourinary: No CVA tenderness; Extremities: Pulses normal, No tenderness, No deformity. Bilat LE's muscles compartments soft. No edema, No calf edema or asymmetry.; Neuro: AA&Ox3, Major CN grossly intact. No facial droop. Speech clear. No gross focal motor or sensory deficits in extremities.; Skin: Color normal, Warm, Dry. +sunburn to face.   ED Course  Procedures     EKG Interpretation   Date/Time:  Saturday April 28 2014 11:16:26 EDT Ventricular Rate:  91 PR Interval:  171 QRS Duration: 99 QT Interval:  399 QTC Calculation: 491 R Axis:   -126 Text Interpretation:  Sinus rhythm Anteroseptal infarct, age indeterminate  Artifact When compared with ECG of 02/05/2014 No significant change was  found Confirmed by [facility]  MD, Nunzio Cory [phone]) on 04/28/2014 11:48:45 AM      MDM  MDM Reviewed: previous chart, nursing note and vitals Reviewed previous: labs and ECG Interpretation: labs, ECG and x-ray    Results for orders placed during the hospital encounter of 04/28/14  CBC WITH DIFFERENTIAL      Result Value Ref Range   WBC 9.7  4.0 - 10.5 K/uL   RBC 5.60 (*) 3.87 - 5.11 MIL/uL   Hemoglobin 15.8 (*) 12.0 - 15.0 g/dL   HCT 48.6 (*) 36.0 - 46.0 %   MCV 86.8  78.0 - 100.0 fL   MCH 28.2  26.0 - 34.0 pg   MCHC 32.5  30.0 - 36.0 g/dL   RDW 14.8  11.5 - 15.5 %   Platelets 116 (*) 150 - 400 K/uL   Neutrophils Relative % 64  43 - 77 %    Neutro Abs 6.2  1.7 - 7.7 K/uL   Lymphocytes Relative 21  12 - 46 %   Lymphs Abs 2.0  0.7 - 4.0 K/uL   Monocytes Relative 13 (*) 3 - 12 %   Monocytes Absolute 1.2 (*) 0.1 - 1.0 K/uL   Eosinophils Relative 2  0 - 5 %   Eosinophils Absolute 0.2  0.0 - 0.7 K/uL   Basophils Relative 1  0 - 1 %   Basophils Absolute 0.1  0.0 - 0.1 K/uL  URINE RAPID DRUG SCREEN (HOSP PERFORMED)      Result Value Ref Range   Opiates NONE DETECTED  NONE DETECTED   Cocaine NONE DETECTED  NONE DETECTED   Benzodiazepines POSITIVE (*) NONE DETECTED   Amphetamines NONE DETECTED  NONE DETECTED   Tetrahydrocannabinol NONE DETECTED  NONE DETECTED   Barbiturates NONE DETECTED  NONE DETECTED  COMPREHENSIVE METABOLIC PANEL      Result Value Ref Range   Sodium 140  137 - 147 mEq/L   Potassium 2.5 (*) 3.7 - 5.3 mEq/L   Chloride 93 (*) 96 - 112 mEq/L   CO2 31  19 - 32 mEq/L   Glucose, Bld 185 (*) 70 - 99 mg/dL   BUN 40 (*) 6 - 23 mg/dL   Creatinine, Ser 1.78 (*) 0.50 - 1.10 mg/dL   Calcium 9.8  8.4 - 10.5 mg/dL   Total Protein 6.8  6.0 - 8.3 g/dL   Albumin 3.6  3.5 - 5.2 g/dL   AST 39 (*) 0 - 37 U/L   ALT 44 (*) 0 - 35 U/L   Alkaline Phosphatase 57  39 - 117 U/L   Total Bilirubin 0.4  0.3 - 1.2 mg/dL   GFR calc non Af Amer 33 (*) >90 mL/min   GFR calc Af Amer 38 (*) >90 mL/min   Anion gap 16 (*) 5 - 15  LIPASE, BLOOD      Result Value Ref Range   Lipase 142 (*) 11 - 59 U/L  TROPONIN I      Result Value Ref Range   Troponin I <0.30  <0.30 ng/mL  URINALYSIS, ROUTINE W REFLEX MICROSCOPIC      Result Value Ref Range   Color, Urine YELLOW  YELLOW   APPearance CLEAR  CLEAR   Specific Gravity, Urine 1.015  1.005 - 1.030   pH 7.0  5.0 - 8.0   Glucose, UA NEGATIVE  NEGATIVE mg/dL   Hgb urine dipstick SMALL (*) NEGATIVE  Bilirubin Urine NEGATIVE  NEGATIVE   Ketones, ur NEGATIVE  NEGATIVE mg/dL   Protein, ur NEGATIVE  NEGATIVE mg/dL   Urobilinogen, UA 0.2  0.0 - 1.0 mg/dL   Nitrite NEGATIVE  NEGATIVE    Leukocytes, UA MODERATE (*) NEGATIVE  PREGNANCY, URINE      Result Value Ref Range   Preg Test, Ur NEGATIVE  NEGATIVE  MAGNESIUM      Result Value Ref Range   Magnesium 2.3  1.5 - 2.5 mg/dL  URINE MICROSCOPIC-ADD ON      Result Value Ref Range   Squamous Epithelial / LPF FEW (*) RARE   WBC, UA 7-10  <3 WBC/hpf   RBC / HPF 0-2  <3 RBC/hpf   Bacteria, UA RARE  RARE   Dg Chest Portable 1 View 04/28/2014   CLINICAL DATA:  Mid chest pain  EXAM: PORTABLE CHEST - 1 VIEW  COMPARISON:  02/05/2014  FINDINGS: The heart size remains moderately enlarged without evidence for edema. Both lungs are clear. The visualized skeletal structures are unremarkable.  IMPRESSION: No active disease.   Electronically Signed   By: Conchita Paris M.D.   On: 04/28/2014 12:19   Dg Abd 2 Views 04/28/2014   CLINICAL DATA:  Abdominal pain and weakness.  EXAM: ABDOMEN - 2 VIEW  COMPARISON:  CT abdomen and pelvis 01/21/2013  FINDINGS: Right upper quadrant surgical clips are noted. There is a small to moderate amount of stool present in the colon. There is a paucity of small bowel gas. There is mild thoracolumbar levoscoliosis.  IMPRESSION: Small to moderate amount of colonic stool. Paucity of small bowel gas, limiting evaluation for obstruction.   Electronically Signed   By: Logan Bores   On: 04/28/2014 12:55    1400:  Pt is not orthostatic on VS. Potassium repleted IV. Platelets and LFT's per baseline. Doubt ACS as cause for symptoms with normal troponin and unchanged EKG from previous after 3 days of constant symptoms. BUN/Cr elevated from baseline; will continue IVF. UC is pending. Dx and testing d/w pt and family.  Questions answered.  Verb understanding, agreeable to admit.  T/C to [facility] Dr. Roderic Palau, case discussed, including:  HPI, pertinent PM/SHx, VS/PE, dx testing, ED course and treatment:  Agreeable to admit, requests to write temporary orders, obtain tele bed to team 2.   Alfonzo Feller, DO 04/29/14 1558

## 2014-04-28 NOTE — ED Notes (Addendum)
Pt states intermittent center CP x 2 days, described as "tightness". Pt also states legs began aching last night and continue today. CP currently is at 6/10, per pt. Pt also states that when she bends over, "hot water or whatever I've had to drink comes up."

## 2014-04-28 NOTE — ED Notes (Signed)
CRITICAL VALUE ALERT  Critical value received: potassium 2.5  Date of notification: 04/28/14  Time of notification:  1221  Critical value read back: yes  Nurse who received alert: j. Jerl Santos  MD notified (1st page): [email or web address]  Time of first page:  1222  MD notified (2nd page):  Time of second page:  Responding MD: [email or web address]  Time MD responded: 1221

## 2014-04-28 NOTE — Progress Notes (Signed)
PT SaO2 decrease while asleep, PT may benefit from sleep study. PT on nocturnal oxygen only if needed.

## 2014-04-28 NOTE — Progress Notes (Deleted)
[facility] History and Physical  Summer Cook LFY:[id number] DOB: [birth year] DOA: 04/28/2014  Referring physician: Dr. Thurnell Cook, ER physician PCP: Summer Herring., MD   Chief Complaint: leg pain  HPI: Summer Cook is a 48 y.o. female who was in her usual state of health when she was sitting out in the sun yesterday for a prolonged period of time. She reports that the same evening, she developed aching pain in her legs bilaterally. She reports that this progressively got worse through the night. She slept in a recliner overnight. She had difficulty walking. She feels generally weak and fatigued. She felt this made her low-grade fever. She's not had any significant cough. She does describe some chest tightness and some wheezing, but feels that it may be related to her reactive airway disease. She has chronic diarrhea which she reports that her baseline. She did have 2 episodes of vomiting the last 4 days, but feels that this may be related to acid reflux. She was evaluated in the emergency room where she was noted to have elevated creatinine above her baseline and significant hypokalemia. She was noted to be clinically dehydrated. Patient will be admitted for further treatment.   Review of Systems:  Pertinent positives as per HPI, otherwise negative  Past Medical History  Diagnosis Date  . Essential hypertension, benign   . 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  Last was Prednisone 41m; 2 wk in 10/2012.  She also was on Prednisone bridged with Cellcept briefly but stopped due to lack of insurance.   . Depression   . Thrush, oral 05/29/2011  . Steroid-induced diabetes   . Pulmonary embolism 04/2012  . Asthma   . Renal insufficiency   . Chronic chest pain   . Normal cardiac stress test 02/2014   Past Surgical History  Procedure Laterality Date  . Cholecystectomy  2008   . Bone marrow biopsy    . 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 Colonoscopy with esophagogastroduodenoscopy (egd) N/A 01/04/2013    Summer Cook esophageal plaques with +KOH, hh. Gastric antrum abnormal, bx reactive gastropathy. Anal canal hemorrhoids, colonic tics, normal TI, single polyp (sessile serrated tubular adenoma). Next TCS 12/2017   Social History:  reports that she quit smoking about 5 years ago. Her smoking use included Cigarettes. She smoked 0.00 packs per day for 0 years. She has never used smokeless tobacco. She reports that she does not drink alcohol or use illicit drugs.  Allergies  Allergen Reactions  . Yellow Jacket Venom Swelling    Family History  Problem Relation Age of Onset  . Cervical cancer Mother   . Lung cancer Father   . Pneumonia Brother   . Cancer Paternal Grandmother 82   colon cancer, breast cancer  . Cancer Paternal Grandfather 629   colon cancer  . AAA (abdominal aortic aneurysm) Paternal Grandmother      Prior to Admission medications   Medication Sig Start Date End Date Taking? Authorizing Provider  albuterol (PROVENTIL HFA;VENTOLIN HFA) 108 (90 BASE) MCG/ACT inhaler Inhale 2 puffs into the lungs every 6 (six) hours as needed for wheezing or shortness of breath.    Yes Historical Provider, MD  ALPRAZolam (Summer Cook 1 MG tablet Take 1 mg by mouth 3 (three) times daily as needed for anxiety.    Yes Historical Provider, MD  calcitonin, salmon, (MIACALCIN/FORTICAL) 200 UNIT/ACT nasal spray Place 1 spray into alternate nostrils  daily.   Yes Historical Provider, MD  dexamethasone (DECADRON) 0.5 MG tablet Take 0.5 mg by mouth 2 (two) times daily.    Yes Historical Provider, MD  furosemide (LASIX) 20 MG tablet Take 20 mg by mouth daily.   Yes Historical Provider, MD  HYDROcodone Bitartrate ER (HYSINGLA ER) 60 MG T24A Take 60 mg by mouth daily.    Yes Historical Provider, MD  insulin aspart (NOVOLOG) 100 UNIT/ML injection Inject 2-5 Units into the skin daily as needed  for high blood sugar. Per sliding scale.   Yes Historical Provider, MD  ipratropium-albuterol (DUONEB) 0.5-2.5 (3) MG/3ML SOLN Take 3 mLs by nebulization 3 (three) times daily as needed (shorntess of breath/wheezing).  03/22/13  Yes Historical Provider, MD  metoprolol succinate (TOPROL-XL) 50 MG 24 hr tablet Take 50 mg by mouth daily. Take with or immediately following a meal.   Yes Historical Provider, MD  mometasone (NASONEX) 50 MCG/ACT nasal spray Place 2 sprays into the nose daily.   Yes Historical Provider, MD  montelukast (SINGULAIR) 10 MG tablet Take 10 mg by mouth daily.   Yes Historical Provider, MD  Multiple Vitamin (MULTIVITAMIN WITH MINERALS) TABS tablet Take 1 tablet by mouth daily.   Yes Historical Provider, MD  sertraline (ZOLOFT) 50 MG tablet Take 150 mg by mouth daily.    Yes Historical Provider, MD  triamterene-hydrochlorothiazide (DYAZIDE) 37.5-25 MG per capsule Take 1 capsule by mouth every morning.    Yes Historical Provider, MD  vitamin B-12 (CYANOCOBALAMIN) 1000 MCG tablet Take 1,000 mcg by mouth daily.   Yes Historical Provider, MD   Physical Exam: Filed Vitals:   04/28/14 1118 04/28/14 1421 04/28/14 1556  BP: 123/87 123/82 117/86  Pulse:  91 84  Temp: 98.2 F (36.8 C)  97.8 F (36.6 C)  TempSrc:   Axillary  Resp: _0 Height: 5' 5" (1.651 m)    Weight: 91.627 kg (202 lb)    SpO2: 95% 96% 95%    Wt Readings from Last 3 Encounters:  04/28/14 91.627 kg (202 lb)  04/02/14 92.262 kg (203 lb 6.4 oz)  02/15/14 104.01 kg (229 lb 4.8 oz)    General:  Sleeping on my arrival, awakes to voice and does not appear to be in distress Eyes: PERRL, normal lids, irises & conjunctiva ENT: mucous membranes dry Neck: no LAD, masses or thyromegaly Cardiovascular: RRR, no m/r/g. No LE edema. Telemetry: SR, no arrhythmias  Respiratory: CTA bilaterally, no w/r/r. Normal respiratory effort. Abdomen: soft, mildly tender throughout abdomen, nd, bs+ Skin: no rash or induration  seen on limited exam Musculoskeletal: grossly normal tone BUE/BLE Psychiatric: grossly normal mood and affect, speech fluent and appropriate Neurologic: grossly non-focal.          Labs on Admission:  Basic Metabolic Panel:  Recent Labs Lab 04/28/14 1136  NA 140  K 2.5*  CL 93*  CO2 31  GLUCOSE 185*  BUN 40*  CREATININE 1.78*  CALCIUM 9.8  MG 2.3   Liver Function Tests:  Recent Labs Lab 04/28/14 1136  AST 39*  ALT 44*  ALKPHOS 57  BILITOT 0.4  PROT 6.8  ALBUMIN 3.6    Recent Labs Lab 04/28/14 1136  LIPASE 142*   No results found for this basename: AMMONIA,  in the last 168 hours CBC:  Recent Labs Lab 04/28/14 1136  WBC 9.7  NEUTROABS 6.2  HGB 15.8*  HCT 48.6*  MCV 86.8  PLT 116*   Cardiac Enzymes:  Recent Labs Lab 04/28/14  [place] <0.30    BNP (last 3 results) No results found for this basename: PROBNP,  in the last 8760 hours CBG: No results found for this basename: GLUCAP,  in the last 168 hours  Radiological Exams on Admission: Dg Chest Portable 1 View  04/28/2014   CLINICAL DATA:  Mid chest pain  EXAM: PORTABLE CHEST - 1 VIEW  COMPARISON:  02/05/2014  FINDINGS: The heart size remains moderately enlarged without evidence for edema. Both lungs are clear. The visualized skeletal structures are unremarkable.  IMPRESSION: No active disease.   Electronically Signed   By: Conchita Paris M.D.   On: 04/28/2014 12:19   Dg Abd 2 Views  04/28/2014   CLINICAL DATA:  Abdominal pain and weakness.  EXAM: ABDOMEN - 2 VIEW  COMPARISON:  CT abdomen and pelvis 01/21/2013  FINDINGS: Right upper quadrant surgical clips are noted. There is a small to moderate amount of stool present in the colon. There is a paucity of small bowel gas. There is mild thoracolumbar levoscoliosis.  IMPRESSION: Small to moderate amount of colonic stool. Paucity of small bowel gas, limiting evaluation for obstruction.   Electronically Signed   By: Logan Bores   On: 04/28/2014  12:55    EKG: Independently reviewed. No acute changes  Assessment/Plan Active Problems:   Hypertension   ITP (idiopathic thrombocytopenic purpura)   Hypokalemia   Dehydration   Acute renal failure   CKD (chronic kidney disease) stage 3, GFR 30-59 ml/min   1. Bilateral leg pain. Likely related to dehydration and electrolyte abnormalities. We'll continue to follow once electrolytes have been corrected 2. Dehydration. Continue with IV fluids. Likely related to spending a prolonged amount of time in the sun 3. Acute on chronic kidney disease stage III. Creatinine is about baseline. Likely related to volume depletion. Continue with IV fluids and monitor urine output. Check CK levels to evaluate for possible rhabdomyolysis 4. Hypokalemia. Likely related to volume depletion/diarrhea. Replace 5. Chronic diarrhea/abdominal pain. Patient plans to followup with GI as an outpatient. 6. Elevated lipase in the setting of renal dysfunction. Unclear clinical significance. The patient appears to be have been tolerating a diet. She does not have any real significant vomiting. We'll continue to treat supportively. 7. History of ITP. Platelet count appears to be near baseline. Continue chronic steroids.   Code Status: full code DVT Prophylaxis: SCD Family Communication: discussed with patient Disposition Plan: discharge home once improved  Time spent: 62mns  MEMON,JEHANZEB [facility] Pager [phone] **Disclaimer: This note may have been dictated with voice recognition software. Similar sounding words can inadvertently be transcribed and this note may contain transcription errors which may not have been corrected upon publication of note.**

## 2014-04-29 DIAGNOSIS — D693 Immune thrombocytopenic purpura: Secondary | ICD-10-CM

## 2014-04-29 LAB — GLUCOSE, CAPILLARY
Glucose-Capillary: 86 mg/dL (ref 70–99)
Glucose-Capillary: 117 mg/dL — ABNORMAL HIGH (ref 70–99)

## 2014-04-29 LAB — CBC
HEMATOCRIT: 46.3 % — AB (ref 36.0–46.0)
HEMOGLOBIN: 14.9 g/dL (ref 12.0–15.0)
MCH: 28.2 pg (ref 26.0–34.0)
MCHC: 32.2 g/dL (ref 30.0–36.0)
MCV: 87.7 fL (ref 78.0–100.0)
Platelets: 120 10*3/uL — ABNORMAL LOW (ref 150–400)
RBC: 5.28 MIL/uL — ABNORMAL HIGH (ref 3.87–5.11)
RDW: 14.8 % (ref 11.5–15.5)
WBC: 8.4 10*3/uL (ref 4.0–10.5)

## 2014-04-29 LAB — COMPREHENSIVE METABOLIC PANEL
ALK PHOS: 51 U/L (ref 39–117)
ALT: 39 U/L — ABNORMAL HIGH (ref 0–35)
AST: 34 U/L (ref 0–37)
Albumin: 3.2 g/dL — ABNORMAL LOW (ref 3.5–5.2)
Anion gap: 11 (ref 5–15)
BUN: 33 mg/dL — ABNORMAL HIGH (ref 6–23)
CALCIUM: 8.7 mg/dL (ref 8.4–10.5)
CO2: 31 mEq/L (ref 19–32)
Chloride: 98 mEq/L (ref 96–112)
Creatinine, Ser: 1.66 mg/dL — ABNORMAL HIGH (ref 0.50–1.10)
GFR calc non Af Amer: 36 mL/min — ABNORMAL LOW (ref >90)
GFR, EST AFRICAN AMERICAN: 41 mL/min — AB (ref >90)
GLUCOSE: 112 mg/dL — AB (ref 70–99)
Potassium: 3.5 mEq/L — ABNORMAL LOW (ref 3.7–5.3)
Sodium: 140 mEq/L (ref 137–147)
TOTAL PROTEIN: 6.1 g/dL (ref 6.0–8.3)
Total Bilirubin: 0.4 mg/dL (ref 0.3–1.2)

## 2014-04-29 LAB — TROPONIN I: Troponin I: <0.3 ng/mL (ref <0.30)

## 2014-04-29 LAB — CK: Total CK: 45 U/L (ref 7–177)

## 2014-04-29 LAB — TSH: TSH: 3.04 u[IU]/mL (ref 0.350–4.500)

## 2014-04-29 MED ORDER — POTASSIUM CHLORIDE CRYS ER 20 MEQ PO TBCR
40.0000 meq | EXTENDED_RELEASE_TABLET | Freq: Once | ORAL | Status: AC
  Administered 2014-04-29: 40 meq via ORAL
  Filled 2014-04-29: qty 2

## 2014-04-29 MED ORDER — FUROSEMIDE 20 MG PO TABS: 20.0000 mg | ORAL_TABLET | Freq: Every day | ORAL | Status: DC

## 2014-04-29 MED ORDER — POTASSIUM CHLORIDE CRYS ER 20 MEQ PO TBCR: 20.0000 meq | EXTENDED_RELEASE_TABLET | Freq: Every day | ORAL | Status: DC

## 2014-04-29 NOTE — Progress Notes (Signed)
Pt d/c home @ 3:05pm this shift. Pt voiced understanding of d/c instructions confirming that she would not continue to take Triamterene/hydrochlorothiazide as ordered and that she was aware of her new order to take potassium chloride SA 20 MEQ tablet QD. Pt stated she would call her PCP's office in the morning to set-up a F/U apt. Pt confirmed that she will drink more water and avoid drinking soft drinks like she normally does at home. Pt provided w/ phone number to call [facility] when she arrives home to have oxygen delivered and set-up in her home for use at night while in bed/asleep as ordered. IV removed from RIGHT hand, catheter intact, WNL. Tolerated well. Pt confirmed that she had all of her belongings with her when she left hospital including her jewelery. Pt escorted out of hospital via w/c and assisted into vehicle with daughter who picked her up.

## 2014-04-29 NOTE — Discharge Summary (Signed)
Physician Discharge Summary  Summer Cook [id number] DOB: April 08, 1966 DOA: 04/28/2014  PCP: Glo Herring., MD  Admit date: 04/28/2014 Discharge date: 04/29/2014  Time spent: 40 minutes  Recommendations for Outpatient Follow-up:  1. Followup with primary care physician in one to 2 weeks 2. Triamterene/hydrochlorothiazide discontinued due to hypokalemia 3. Patient was hypoxic overnight and will be discharged on nocturnal oxygen. Consider outpatient sleep study 4. Basic metabolic panel in one week to ensure that creatinine is back to baseline  Discharge Diagnoses:  Active Problems:   Hypertension   ITP (idiopathic thrombocytopenic purpura)   Hypokalemia   Dehydration   Acute renal failure   CKD (chronic kidney disease) stage 3, GFR 30-59 ml/min   Discharge Condition: improved  Diet recommendation: low salt  Filed Weights   04/28/14 1118  Weight: 91.627 kg (202 lb)    History of present illness and hospital course:  This patient was admitted to the hospital with diffuse myalgias, more so in her legs, generalized weakness and some nausea and vomiting. She was found to be significantly dehydrated and hypokalemic. The patient is on Lasix as well as triamterene/hydrochlorothiazide. She was sitting out in the sun for several hours prior to admission. The patient was started on intravenous fluids and potassium was corrected. Diet was advanced to solid food she tolerated this very well. Her myalgias and generalized weakness has significantly improved with hydration. We will discontinue triamterene/hydrochlorothiazide this time. Continue Lasix to be started in the next 3 days as well as potassium supplementation. She'll need a repeat basic metabolic panel in one week. She was noted to be hypoxic overnight while sleeping. She'll be discharged home on nocturnal oxygen and should be considered for an outpatient sleep study.  Procedures:    Consultations:    Discharge Exam: Filed  Vitals:   04/29/14 1352  BP: 119/74  Pulse: 86  Temp: 97.8 F (36.6 C)  Resp: 20    General: NAD Cardiovascular: S1, s2 RRR Respiratory: CTA B  Discharge Instructions You were cared for by a hospitalist during your hospital stay. If you have any questions about your discharge medications or the care you received while you were in the hospital after you are discharged, you can call the unit and asked to speak with the hospitalist on call if the hospitalist that took care of you is not available. Once you are discharged, your primary care physician will handle any further medical issues. Please note that NO REFILLS for any discharge medications will be authorized once you are discharged, as it is imperative that you return to your primary care physician (or establish a relationship with a primary care physician if you do not have one) for your aftercare needs so that they can reassess your need for medications and monitor your lab values.  Discharge Instructions   Call MD for:  extreme fatigue    Complete by:  As directed      Call MD for:  persistant dizziness or light-headedness    Complete by:  As directed      Diet - low sodium heart healthy    Complete by:  As directed      For home use only DME oxygen    Complete by:  As directed   Mode or (Route):  Nasal cannula  Liters per Minute:  2  Frequency:  Only at night  Oxygen conserving device:  Yes  Oxygen delivery system:  Gas     Increase activity slowly    Complete by:  As directed             Medication List    STOP taking these medications       triamterene-hydrochlorothiazide 37.5-25 MG per capsule  Commonly known as:  DYAZIDE      TAKE these medications       albuterol 108 (90 BASE) MCG/ACT inhaler  Commonly known as:  PROVENTIL HFA;VENTOLIN HFA  Inhale 2 puffs into the lungs every 6 (six) hours as needed for wheezing or shortness of breath.     ALPRAZolam 1 MG tablet  Commonly known as:  XANAX  Take 1 mg by  mouth 3 (three) times daily as needed for anxiety.     calcitonin (salmon) 200 UNIT/ACT nasal spray  Commonly known as:  MIACALCIN/FORTICAL  Place 1 spray into alternate nostrils daily.     dexamethasone 0.5 MG tablet  Commonly known as:  DECADRON  Take 0.5 mg by mouth 2 (two) times daily.     furosemide 20 MG tablet  Commonly known as:  LASIX  Take 1 tablet (20 mg total) by mouth daily. Restart in 3 days     HYSINGLA ER 60 MG T24a  Generic drug:  HYDROcodone Bitartrate ER  Take 60 mg by mouth daily.     insulin aspart 100 UNIT/ML injection  Commonly known as:  novoLOG  Inject 2-5 Units into the skin daily as needed for high blood sugar. Per sliding scale.     ipratropium-albuterol 0.5-2.5 (3) MG/3ML Soln  Commonly known as:  DUONEB  Take 3 mLs by nebulization 3 (three) times daily as needed (shorntess of breath/wheezing).     metoprolol succinate 50 MG 24 hr tablet  Commonly known as:  TOPROL-XL  Take 50 mg by mouth daily. Take with or immediately following a meal.     mometasone 50 MCG/ACT nasal spray  Commonly known as:  NASONEX  Place 2 sprays into the nose daily.     montelukast 10 MG tablet  Commonly known as:  SINGULAIR  Take 10 mg by mouth daily.     multivitamin with minerals Tabs tablet  Take 1 tablet by mouth daily.     potassium chloride SA 20 MEQ tablet  Commonly known as:  K-DUR,KLOR-CON  Take 1 tablet (20 mEq total) by mouth daily.     sertraline 50 MG tablet  Commonly known as:  ZOLOFT  Take 150 mg by mouth daily.     vitamin B-12 1000 MCG tablet  Commonly known as:  CYANOCOBALAMIN  Take 1,000 mcg by mouth daily.       Allergies  Allergen Reactions  . Yellow Jacket Venom Swelling       Follow-up Information   Follow up with Glo Herring., MD. Schedule an appointment as soon as possible for a visit in 1 week.   Specialty:  Internal Medicine   Contact information:   [address] [place] [id number] [phone]         The results of significant diagnostics from this hospitalization (including imaging, microbiology, ancillary and laboratory) are listed below for reference.    Significant Diagnostic Studies: Dg Chest Portable 1 View  04/28/2014   CLINICAL DATA:  Mid chest pain  EXAM: PORTABLE CHEST - 1 VIEW  COMPARISON:  02/05/2014  FINDINGS: The heart size remains moderately enlarged without evidence for edema. Both lungs are clear. The visualized skeletal structures are unremarkable.  IMPRESSION: No active disease.   Electronically Signed   By: Roena Malady.D.  On: 04/28/2014 12:19   Dg Abd 2 Views  04/28/2014   CLINICAL DATA:  Abdominal pain and weakness.  EXAM: ABDOMEN - 2 VIEW  COMPARISON:  CT abdomen and pelvis 01/21/2013  FINDINGS: Right upper quadrant surgical clips are noted. There is a small to moderate amount of stool present in the colon. There is a paucity of small bowel gas. There is mild thoracolumbar levoscoliosis.  IMPRESSION: Small to moderate amount of colonic stool. Paucity of small bowel gas, limiting evaluation for obstruction.   Electronically Signed   By: Logan Bores   On: 04/28/2014 12:55    Microbiology: No results found for this or any previous visit (from the past 240 hour(s)).   Labs: Basic Metabolic Panel:  Recent Labs Lab 04/28/14 1136 04/29/14 0141  NA 140 140  K 2.5* 3.5*  CL 93* 98  CO2 31 31  GLUCOSE 185* 112*  BUN 40* 33*  CREATININE 1.78* 1.66*  CALCIUM 9.8 8.7  MG 2.3  --    Liver Function Tests:  Recent Labs Lab 04/28/14 1136 04/29/14 0141  AST 39* 34  ALT 44* 39*  ALKPHOS 57 51  BILITOT 0.4 0.4  PROT 6.8 6.1  ALBUMIN 3.6 3.2*    Recent Labs Lab 04/28/14 1136  LIPASE 142*   No results found for this basename: AMMONIA,  in the last 168 hours CBC:  Recent Labs Lab 04/28/14 1136 04/29/14 0141  WBC 9.7 8.4  NEUTROABS 6.2  --   HGB 15.8* 14.9  HCT 48.6* 46.3*  MCV 86.8 87.7  PLT 116* 120*   Cardiac Enzymes:  Recent  Labs Lab 04/28/14 1136 04/28/14 1943 04/29/14 0141 04/29/14 0736  CKTOTAL  --   --   --  62  TROPONINI <0.30 <0.30 <0.30 <0.30   BNP: BNP (last 3 results) No results found for this basename: PROBNP,  in the last 8760 hours CBG:  Recent Labs Lab 04/29/14 0746 04/29/14 1204  GLUCAP 86 117*       Signed:  MEMON,JEHANZEB  [facility] 04/29/2014, 2:01 PM

## 2014-04-29 NOTE — Discharge Instructions (Signed)

## 2014-04-29 NOTE — Progress Notes (Signed)
Nutrition Brief Note  Patient identified on the Malnutrition Screening Tool (MST) Report  Pt admitted with dehydration following extended sun exposure. Her Body mass index is 33.61 kg/(m^2). Patient meets criteria for obesity class I based on current BMI.  Pt home diet is regular, current diet order is Heart Healthy, patient is consuming approximately 100% of meals at this time. Labs and medications reviewed.   No nutrition interventions warranted at this time. If nutrition issues arise, please consult RD.   Colman Cater MS,RD,CSG,LDN Office: [phone] Pager: [phone]

## 2014-04-29 NOTE — Progress Notes (Signed)
Patients RA sat at rest is 95-96% While ambulating RA sat remains 93-95% Tolerates ambulation in hall well with just supervision.

## 2014-04-30 ENCOUNTER — Emergency Department (HOSPITAL_COMMUNITY)
Admission: EM | Admit: 2014-04-30 | Discharge: 2014-05-01 | Disposition: A | Discharge status: Home / Self Care | Payer: Medicare Other | Attending: Emergency Medicine | Admitting: Emergency Medicine

## 2014-04-30 ENCOUNTER — Encounter (HOSPITAL_COMMUNITY): Payer: Self-pay | Admitting: Emergency Medicine

## 2014-04-30 DIAGNOSIS — G8929 Other chronic pain: Secondary | ICD-10-CM | POA: Diagnosis not present

## 2014-04-30 DIAGNOSIS — R197 Diarrhea, unspecified: Secondary | ICD-10-CM

## 2014-04-30 DIAGNOSIS — Z86711 Personal history of pulmonary embolism: Secondary | ICD-10-CM | POA: Insufficient documentation

## 2014-04-30 DIAGNOSIS — F329 Major depressive disorder, single episode, unspecified: Secondary | ICD-10-CM | POA: Insufficient documentation

## 2014-04-30 DIAGNOSIS — J45909 Unspecified asthma, uncomplicated: Secondary | ICD-10-CM | POA: Insufficient documentation

## 2014-04-30 DIAGNOSIS — F3289 Other specified depressive episodes: Secondary | ICD-10-CM | POA: Diagnosis not present

## 2014-04-30 DIAGNOSIS — Z87448 Personal history of other diseases of urinary system: Secondary | ICD-10-CM | POA: Diagnosis not present

## 2014-04-30 DIAGNOSIS — Z87891 Personal history of nicotine dependence: Secondary | ICD-10-CM | POA: Diagnosis not present

## 2014-04-30 DIAGNOSIS — Z862 Personal history of diseases of the blood and blood-forming organs and certain disorders involving the immune mechanism: Secondary | ICD-10-CM | POA: Insufficient documentation

## 2014-04-30 DIAGNOSIS — K921 Melena: Secondary | ICD-10-CM | POA: Diagnosis not present

## 2014-04-30 DIAGNOSIS — Z8619 Personal history of other infectious and parasitic diseases: Secondary | ICD-10-CM | POA: Diagnosis not present

## 2014-04-30 DIAGNOSIS — Z794 Long term (current) use of insulin: Secondary | ICD-10-CM | POA: Insufficient documentation

## 2014-04-30 DIAGNOSIS — K625 Hemorrhage of anus and rectum: Secondary | ICD-10-CM

## 2014-04-30 DIAGNOSIS — Z79899 Other long term (current) drug therapy: Secondary | ICD-10-CM | POA: Diagnosis not present

## 2014-04-30 DIAGNOSIS — IMO0002 Reserved for concepts with insufficient information to code with codable children: Secondary | ICD-10-CM | POA: Diagnosis not present

## 2014-04-30 DIAGNOSIS — I10 Essential (primary) hypertension: Secondary | ICD-10-CM | POA: Diagnosis not present

## 2014-04-30 LAB — COMPREHENSIVE METABOLIC PANEL
ALBUMIN: 3.4 g/dL — AB (ref 3.5–5.2)
ALK PHOS: 48 U/L (ref 39–117)
ALT: 41 U/L — ABNORMAL HIGH (ref 0–35)
AST: 41 U/L — ABNORMAL HIGH (ref 0–37)
Anion gap: 14 (ref 5–15)
BUN: 20 mg/dL (ref 6–23)
CHLORIDE: 106 meq/L (ref 96–112)
CO2: 22 meq/L (ref 19–32)
CREATININE: 1.15 mg/dL — AB (ref 0.50–1.10)
Calcium: 9 mg/dL (ref 8.4–10.5)
GFR, EST AFRICAN AMERICAN: 64 mL/min — AB (ref >90)
GFR, EST NON AFRICAN AMERICAN: 55 mL/min — AB (ref >90)
GLUCOSE: 87 mg/dL (ref 70–99)
POTASSIUM: 4.4 meq/L (ref 3.7–5.3)
Sodium: 142 mEq/L (ref 137–147)
Total Bilirubin: 0.4 mg/dL (ref 0.3–1.2)
Total Protein: 6.4 g/dL (ref 6.0–8.3)

## 2014-04-30 LAB — CBC WITH DIFFERENTIAL/PLATELET
BASOS PCT: 0 % (ref 0–1)
Basophils Absolute: 0 10*3/uL (ref 0.0–0.1)
Eosinophils Absolute: 0.1 10*3/uL (ref 0.0–0.7)
Eosinophils Relative: 1 % (ref 0–5)
HCT: 45.4 % (ref 36.0–46.0)
HEMOGLOBIN: 14.8 g/dL (ref 12.0–15.0)
LYMPHS ABS: 2.9 10*3/uL (ref 0.7–4.0)
Lymphocytes Relative: 33 % (ref 12–46)
MCH: 28.4 pg (ref 26.0–34.0)
MCHC: 32.6 g/dL (ref 30.0–36.0)
MCV: 87.1 fL (ref 78.0–100.0)
MONO ABS: 1 10*3/uL (ref 0.1–1.0)
MONOS PCT: 11 % (ref 3–12)
NEUTROS ABS: 4.9 10*3/uL (ref 1.7–7.7)
NEUTROS PCT: 55 % (ref 43–77)
Platelets: 120 10*3/uL — ABNORMAL LOW (ref 150–400)
RBC: 5.21 MIL/uL — AB (ref 3.87–5.11)
RDW: 14.9 % (ref 11.5–15.5)
WBC: 9 10*3/uL (ref 4.0–10.5)

## 2014-04-30 LAB — I-STAT CG4 LACTIC ACID, ED: Lactic Acid, Venous: 1.06 mmol/L (ref 0.5–2.2)

## 2014-04-30 LAB — POC OCCULT BLOOD, ED: Fecal Occult Bld: POSITIVE — AB

## 2014-04-30 LAB — LIPASE, BLOOD: LIPASE: 138 U/L — AB (ref 11–59)

## 2014-04-30 NOTE — ED Notes (Signed)
Pt states admitted at [facility] over the weekend and she was released yesterday. Pt was admitted for hypokalemia and dehydration. Pt states yesterday she started having diarrhea and noticed some redness to the diarrhea once. Pt currently having brown stools. Pt states able to eat and keep down food (including a hamburger today)

## 2014-04-30 NOTE — ED Provider Notes (Signed)
CSN: [id number]     Arrival date & time 04/30/14  2042 History   First MD Initiated Contact with Patient 04/30/14 2224     Chief Complaint  Patient presents with  . Diarrhea  . Rectal Bleeding     (Consider location/radiation/quality/duration/timing/severity/associated sxs/prior Treatment) HPI Comments: This is a 48 year old morbidly obese, female, with a number of chronic medical issues.  He reports, that for the past 24, hours.  She's had diarrhea.  That has become bloody.  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  [facility] for hypokalemia and dehydration and acute renal insufficiency.  A number of her medications were discontinued or stopped. She states she has not "right" since her  colonoscopy a year ago.   Patient is a 48 y.o. female presenting with diarrhea and hematochezia. The history is provided by the patient.  Diarrhea Quality:  Bloody Severity:  Severe Onset quality:  Gradual Number of episodes:  8 Timing:  Intermittent Progression:  Worsening Relieved by:  None tried Worsened by:  Nothing tried Ineffective treatments:  None tried Associated symptoms: no abdominal pain, no recent cough and no fever   Rectal Bleeding Associated symptoms: no abdominal pain and no fever     Past Medical History  Diagnosis Date  . Essential hypertension, benign   . 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  Last was Prednisone $RemoveBefore'40mg'YDCkEfRkYLYED$ ; 2 wk in 10/2012.  She also was on Prednisone bridged with Cellcept briefly but stopped due to lack of insurance.   . Depression   . Thrush, oral 05/29/2011  . Steroid-induced diabetes   . Pulmonary embolism 04/2012  . Asthma   . Renal insufficiency   . Chronic chest pain   . Normal cardiac stress test 02/2014   Past Surgical History  Procedure Laterality Date  .  Cholecystectomy  2008  . Bone marrow biopsy    . 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 Colonoscopy with esophagogastroduodenoscopy (egd) N/A 01/04/2013    Dr. Gala Romney: esophageal plaques with +KOH, hh. Gastric antrum abnormal, bx reactive gastropathy. Anal canal hemorrhoids, colonic tics, normal TI, single polyp (sessile serrated tubular adenoma). Next TCS 12/2017   Family History  Problem Relation Age of Onset  . Cervical cancer Mother   . Lung cancer Father   . Pneumonia Brother   . Cancer Paternal Grandmother 52    colon cancer, breast cancer  . Cancer Paternal Grandfather 46    colon cancer  . AAA (abdominal aortic aneurysm) Paternal Grandmother    History  Substance Use Topics  . Smoking status: Former Smoker -- 0 years    Types: Cigarettes    Quit date: 02/14/2009  . Smokeless tobacco: Never Used  . Alcohol Use: No   OB History   Grav Para Term Preterm Abortions TAB SAB Ect Mult Living                 Review of Systems  Constitutional: Negative for fever.  Gastrointestinal: Positive for diarrhea and hematochezia. Negative for abdominal pain.      Allergies  Yellow jacket venom  Home Medications   Prior to Admission medications   Medication Sig Start Date End Date Taking? Authorizing Provider  albuterol (PROVENTIL HFA;VENTOLIN HFA) 108 (90 BASE) MCG/ACT inhaler Inhale 2 puffs into the lungs every 6 (six) hours as needed for wheezing or shortness  of breath.    Yes Historical Provider, MD  ALPRAZolam Duanne Moron) 1 MG tablet Take 1 mg by mouth 3 (three) times daily as needed for anxiety.    Yes Historical Provider, MD  calcitonin, salmon, (MIACALCIN/FORTICAL) 200 UNIT/ACT nasal spray Place 1 spray into alternate nostrils daily.   Yes Historical Provider, MD  dexamethasone (DECADRON) 0.5 MG tablet Take 0.5 mg by mouth 2 (two) times daily.    Yes Historical Provider, MD  HYDROcodone Bitartrate ER (HYSINGLA ER) 60 MG T24A Take 60 mg by mouth daily.    Yes Historical Provider, MD   insulin aspart (NOVOLOG) 100 UNIT/ML injection Inject 2-5 Units into the skin daily as needed for high blood sugar. Per sliding scale.   Yes Historical Provider, MD  ipratropium-albuterol (DUONEB) 0.5-2.5 (3) MG/3ML SOLN Take 3 mLs by nebulization 3 (three) times daily as needed (shorntess of breath/wheezing).  03/22/13  Yes Historical Provider, MD  metoprolol succinate (TOPROL-XL) 50 MG 24 hr tablet Take 50 mg by mouth daily. Take with or immediately following a meal.   Yes Historical Provider, MD  mometasone (NASONEX) 50 MCG/ACT nasal spray Place 2 sprays into the nose daily.   Yes Historical Provider, MD  montelukast (SINGULAIR) 10 MG tablet Take 10 mg by mouth daily.   Yes Historical Provider, MD  Multiple Vitamin (MULTIVITAMIN WITH MINERALS) TABS tablet Take 1 tablet by mouth daily.   Yes Historical Provider, MD  potassium chloride SA (K-DUR,KLOR-CON) 20 MEQ tablet Take 1 tablet (20 mEq total) by mouth daily. 04/29/14  Yes Kathie Dike, MD  sertraline (ZOLOFT) 50 MG tablet Take 150 mg by mouth daily.    Yes Historical Provider, MD  vitamin B-12 (CYANOCOBALAMIN) 1000 MCG tablet Take 1,000 mcg by mouth daily.   Yes Historical Provider, MD  furosemide (LASIX) 20 MG tablet Take 1 tablet (20 mg total) by mouth daily. Restart in 3 days 04/29/14   Kathie Dike, MD  ondansetron (ZOFRAN) 4 MG tablet Take 1 tablet (4 mg total) by mouth every 6 (six) hours. 05/01/14   Ezequiel Essex, MD   BP 133/86  Pulse 68  Temp(Src) 97.7 F (36.5 C) (Oral)  Resp 19  Ht $R'5\' 5"'NH$  (1.651 m)  Wt 202 lb (91.627 kg)  BMI 33.61 kg/m2  SpO2 95%  LMP 05/13/2011 Physical Exam  ED Course  Procedures (including critical care time) Labs Review Labs Reviewed  CBC WITH DIFFERENTIAL - Abnormal; Notable for the following:    RBC 5.21 (*)    Platelets 120 (*)    All other components within normal limits  COMPREHENSIVE METABOLIC PANEL - Abnormal; Notable for the following:    Creatinine, Ser 1.15 (*)    Albumin 3.4 (*)     AST 41 (*)    ALT 41 (*)    GFR calc non Af Amer 55 (*)    GFR calc Af Amer 64 (*)    All other components within normal limits  LIPASE, BLOOD - Abnormal; Notable for the following:    Lipase 138 (*)    All other components within normal limits  POC OCCULT BLOOD, ED - Abnormal; Notable for the following:    Fecal Occult Bld POSITIVE (*)    All other components within normal limits  I-STAT CG4 LACTIC ACID, ED  I-STAT TROPOININ, ED    Imaging Review No results found.   EKG Interpretation   Date/Time:  Monday April 30 2014 23:43:34 EDT Ventricular Rate:  73 PR Interval:  171 QRS Duration: 99 QT Interval:  438 QTC Calculation: 483 R Axis:   -86 Text Interpretation:  Sinus rhythm Left axis deviation Anterior infarct,  age indeterminate No significant change was found Confirmed by Wyvonnia Dusky   MD, STEPHEN [phone]) on 04/30/2014 11:46:59 PM      MDM   Final diagnoses:  Diarrhea  Rectal bleeding         Garald Balding, NP 05/01/14 [phone]

## 2014-05-01 DIAGNOSIS — R197 Diarrhea, unspecified: Secondary | ICD-10-CM | POA: Diagnosis not present

## 2014-05-01 LAB — I-STAT TROPONIN, ED: TROPONIN I, POC: 0 ng/mL (ref 0.00–0.08)

## 2014-05-01 MED ORDER — ONDANSETRON HCL 4 MG PO TABS: 4.0000 mg | ORAL_TABLET | Freq: Four times a day (QID) | ORAL | Status: DC

## 2014-05-01 NOTE — ED Provider Notes (Signed)
Medical screening examination/treatment/procedure(s) were conducted as a shared visit with non-physician practitioner(s) and myself.  I personally evaluated the patient during the encounter.  Episodes of diarrhea today that were first brown and then noticed some blood. Diffuse abdominal tenderness. Just discharged from [facility] yesterday after admission for dehydration and hypokalemia. Hx ITP with stable hemoglobin and platelets today.  Colonoscopy in 2014 showed hemorrhoids and diverticula. Vitals stable, nontoxic, abdomen soft.   EKG Interpretation   Date/Time:  Monday April 30 2014 23:43:34 EDT Ventricular Rate:  73 PR Interval:  171 QRS Duration: 99 QT Interval:  438 QTC Calculation: 483 R Axis:   -86 Text Interpretation:  Sinus rhythm Left axis deviation Anterior infarct,  age indeterminate No significant change was found Confirmed by Wyvonnia Dusky   MD, Zaliah Wissner [phone]) on 04/30/2014 11:46:59 PM Also confirmed by Wyvonnia Dusky  MD,  Dilpreet Faires [phone]), editor [place]  CCT, BEVERLY ([id number])  on 05/01/2014  8:03:41 AM       Ezequiel Essex, MD 05/01/14 1027

## 2014-05-01 NOTE — Discharge Instructions (Signed)
Bloody Stools  Bloody stools often mean that there is a problem in the digestive tract. Your caregiver may use the term "melena" to describe black, tarry, and bad smelling stools or "hematochezia" to describe red or maroon-colored stools. Blood seen in the stool can be caused by bleeding anywhere along the intestinal tract.   A black stool usually means that blood is coming from the upper part of the gastrointestinal tract (esophagus, stomach, or small bowel). Passing maroon-colored stools or bright red blood usually means that blood is coming from lower down in the large bowel or the rectum. However, sometimes massive bleeding in the stomach or small intestine can cause bright red bloody stools.   Consuming black licorice, lead, iron pills, medicines containing bismuth subsalicylate, or blueberries can also cause black stools. Your caregiver can test black stools to see if blood is present.  It is important that the cause of the bleeding be found. Treatment can then be started, and the problem can be corrected. Rectal bleeding may not be serious, but you should not assume everything is okay until you know the cause. It is very important to follow up with your caregiver or a specialist in gastrointestinal problems.  CAUSES   Blood in the stools can come from various underlying causes. Often, the cause is not found during your first visit. Testing is often needed to discover the cause of bleeding in the gastrointestinal tract. Causes range from simple to serious or even life-threatening. Possible causes include:  · Hemorrhoids. These are veins that are full of blood (engorged) in the rectum. They cause pain, inflammation, and may bleed.  · Anal fissures. These are areas of painful tearing which may bleed. They are often caused by passing hard stool.  · Diverticulosis. These are pouches that form on the colon over time, with age, and may bleed significantly.  · Diverticulitis. This is inflammation in areas with  diverticulosis. It can cause pain, fever, and bloody stools, although bleeding is rare.  · Proctitis and colitis. These are inflamed areas of the rectum or colon. They may cause pain, fever, and bloody stools.  · Polyps and cancer. Colon cancer is a leading cause of preventable cancer death. It often starts out as precancerous polyps that can be removed during a colonoscopy, preventing progression into cancer. Sometimes, polyps and cancer may cause rectal bleeding.  · Gastritis and ulcers. Bleeding from the upper gastrointestinal tract (near the stomach) may travel through the intestines and produce black, sometimes tarry, often bad smelling stools. In certain cases, if the bleeding is fast enough, the stools may not be black, but red and the condition may be life-threatening.  SYMPTOMS   You may have stools that are bright red and bloody, that are normal color with blood on them, or that are dark black and tarry. In some cases, you may only have blood in the toilet bowl. Any of these cases need medical care. You may also have:  · Pain at the anus or anywhere in the rectum.  · Lightheadedness or feeling faint.  · Extreme weakness.  · Nausea or vomiting.  · Fever.  DIAGNOSIS  Your caregiver may use the following methods to find the cause of your bleeding:  · Taking a medical history. Age is important. Older people tend to develop polyps and cancer more often. If there is anal pain and a hard, large stool associated with bleeding, a tear of the anus may be the cause. If blood drips into the toilet after a bowel movement, bleeding hemorrhoids may be the   problem. The color and frequency of the bleeding are additional considerations. In most cases, the medical history provides clues, but seldom the final answer.  · A visual and finger (digital) exam. Your caregiver will inspect the anal area, looking for tears and hemorrhoids. A finger exam can provide information when there is tenderness or a growth inside. In men, the  prostate is also examined.  · Endoscopy. Several types of small, long scopes (endoscopes) are used to view the colon.  ¨ In the office, your caregiver may use a rigid, or more commonly, a flexible viewing sigmoidoscope. This exam is called flexible sigmoidoscopy. It is performed in 5 to 10 minutes.  ¨ A more thorough exam is accomplished with a colonoscope. It allows your caregiver to view the entire 5 to 6 foot long colon. Medicine to help you relax (sedative) is usually given for this exam. Frequently, a bleeding lesion may be present beyond the reach of the sigmoidoscope. So, a colonoscopy may be the best exam to start with. Both exams are usually done on an outpatient basis. This means the patient does not stay overnight in the hospital or surgery center.  ¨ An upper endoscopy may be needed to examine your stomach. Sedation is used and a flexible endoscope is put in your mouth, down to your stomach.  · A barium enema X-ray. This is an X-ray exam. It uses liquid barium inserted by enema into the rectum. This test alone may not identify an actual bleeding point. X-rays highlight abnormal shadows, such as those made by lumps (tumors), diverticuli, or colitis.  TREATMENT   Treatment depends on the cause of your bleeding.   · For bleeding from the stomach or colon, the caregiver doing your endoscopy or colonoscopy may be able to stop the bleeding as part of the procedure.  · Inflammation or infection of the colon can be treated with medicines.  · Many rectal problems can be treated with creams, suppositories, or warm baths.  · Surgery is sometimes needed.  · Blood transfusions are sometimes needed if you have lost a lot of blood.  · For any bleeding problem, let your caregiver know if you take aspirin or other blood thinners regularly.  HOME CARE INSTRUCTIONS   · Take any medicines exactly as prescribed.  · Keep your stools soft by eating a diet high in fiber. Prunes (1 to 3 a day) work well for many people.  · Drink  enough water and fluids to keep your urine clear or pale yellow.  · Take sitz baths if advised. A sitz bath is when you sit in a bathtub with warm water for 10 to 15 minutes to soak, soothe, and cleanse the rectal area.  · If enemas or suppositories are advised, be sure you know how to use them. Tell your caregiver if you have problems with this.  · Monitor your bowel movements to look for signs of improvement or worsening.  SEEK MEDICAL CARE IF:   · You do not improve in the time expected.  · Your condition worsens after initial improvement.  · You develop any new symptoms.  SEEK IMMEDIATE MEDICAL CARE IF:   · You develop severe or prolonged rectal bleeding.  · You vomit blood.  · You feel weak or faint.  · You have a fever.  MAKE SURE YOU:  · Understand these instructions.  · Will watch your condition.  · Will get help right away if you are not doing well or get worse.    Document Released: 09/04/2002 Document Revised: 12/07/2011 Document Reviewed: 01/30/2011  [employer]® Patient Information ©2015 [employer]. This information is not intended to replace advice given to you by your health care provider. Make sure you discuss any questions you have with your health care provider.

## 2014-05-07 DIAGNOSIS — E2749 Other adrenocortical insufficiency: Secondary | ICD-10-CM | POA: Diagnosis not present

## 2014-05-07 DIAGNOSIS — G8929 Other chronic pain: Secondary | ICD-10-CM | POA: Diagnosis not present

## 2014-05-07 DIAGNOSIS — Z6835 Body mass index (BMI) 35.0-35.9, adult: Secondary | ICD-10-CM | POA: Diagnosis not present

## 2014-05-07 DIAGNOSIS — I10 Essential (primary) hypertension: Secondary | ICD-10-CM | POA: Diagnosis not present

## 2014-05-07 DIAGNOSIS — E876 Hypokalemia: Secondary | ICD-10-CM | POA: Diagnosis not present

## 2014-05-08 DIAGNOSIS — H2640 Unspecified secondary cataract: Secondary | ICD-10-CM | POA: Diagnosis not present

## 2014-05-08 DIAGNOSIS — H532 Diplopia: Secondary | ICD-10-CM | POA: Diagnosis not present

## 2014-05-08 DIAGNOSIS — H25049 Posterior subcapsular polar age-related cataract, unspecified eye: Secondary | ICD-10-CM | POA: Diagnosis not present

## 2014-05-11 ENCOUNTER — Telehealth: Payer: Self-pay | Admitting: Cardiology

## 2014-05-11 ENCOUNTER — Institutional Professional Consult (permissible substitution): Payer: Self-pay | Admitting: Neurology

## 2014-05-11 MED ORDER — METOPROLOL SUCCINATE ER 50 MG PO TB24: 50.0000 mg | ORAL_TABLET | Freq: Every day | ORAL | Status: DC

## 2014-05-11 NOTE — Telephone Encounter (Signed)
[facility] to [facility] for metoprolol succ 50 mg daily 90 day supply with rf 4

## 2014-05-11 NOTE — Telephone Encounter (Signed)
Please see refill bin / tgs  °

## 2014-05-16 ENCOUNTER — Other Ambulatory Visit (HOSPITAL_COMMUNITY): Payer: Medicare Other

## 2014-05-17 ENCOUNTER — Other Ambulatory Visit (HOSPITAL_COMMUNITY): Payer: Medicare Other

## 2014-05-17 ENCOUNTER — Ambulatory Visit (HOSPITAL_COMMUNITY): Payer: Medicare Other

## 2014-05-24 ENCOUNTER — Encounter: Payer: Self-pay | Admitting: Neurology

## 2014-05-24 DIAGNOSIS — H25049 Posterior subcapsular polar age-related cataract, unspecified eye: Secondary | ICD-10-CM | POA: Diagnosis not present

## 2014-05-24 DIAGNOSIS — H2589 Other age-related cataract: Secondary | ICD-10-CM | POA: Diagnosis not present

## 2014-05-29 ENCOUNTER — Ambulatory Visit (HOSPITAL_COMMUNITY): Payer: Medicare Other

## 2014-05-29 ENCOUNTER — Other Ambulatory Visit (HOSPITAL_COMMUNITY): Payer: Medicare Other

## 2014-05-30 ENCOUNTER — Encounter (HOSPITAL_COMMUNITY): Payer: Self-pay

## 2014-05-30 ENCOUNTER — Encounter (HOSPITAL_BASED_OUTPATIENT_CLINIC_OR_DEPARTMENT_OTHER): Payer: Medicare Other

## 2014-05-30 ENCOUNTER — Encounter (HOSPITAL_COMMUNITY): Payer: Medicare Other | Attending: Hematology and Oncology

## 2014-05-30 VITALS — BP 100/72 | HR 83 | Temp 98.9°F | Resp 18 | Wt 219.0 lb

## 2014-05-30 DIAGNOSIS — I10 Essential (primary) hypertension: Secondary | ICD-10-CM

## 2014-05-30 DIAGNOSIS — E611 Iron deficiency: Secondary | ICD-10-CM

## 2014-05-30 DIAGNOSIS — E119 Type 2 diabetes mellitus without complications: Secondary | ICD-10-CM | POA: Diagnosis not present

## 2014-05-30 DIAGNOSIS — Z9081 Acquired absence of spleen: Secondary | ICD-10-CM

## 2014-05-30 DIAGNOSIS — D693 Immune thrombocytopenic purpura: Secondary | ICD-10-CM | POA: Diagnosis not present

## 2014-05-30 DIAGNOSIS — D649 Anemia, unspecified: Secondary | ICD-10-CM

## 2014-05-30 DIAGNOSIS — D509 Iron deficiency anemia, unspecified: Secondary | ICD-10-CM | POA: Diagnosis not present

## 2014-05-30 DIAGNOSIS — I2699 Other pulmonary embolism without acute cor pulmonale: Secondary | ICD-10-CM

## 2014-05-30 LAB — D-DIMER, QUANTITATIVE (NOT AT ARMC): D-Dimer, Quant: 0.72 ug/mL-FEU — ABNORMAL HIGH (ref 0.00–0.48)

## 2014-05-30 LAB — CBC WITH DIFFERENTIAL/PLATELET
BASOS PCT: 1 % (ref 0–1)
Basophils Absolute: 0.1 10*3/uL (ref 0.0–0.1)
EOS ABS: 0.1 10*3/uL (ref 0.0–0.7)
Eosinophils Relative: 1 % (ref 0–5)
HEMATOCRIT: 47.7 % — AB (ref 36.0–46.0)
Hemoglobin: 15.8 g/dL — ABNORMAL HIGH (ref 12.0–15.0)
LYMPHS ABS: 2.5 10*3/uL (ref 0.7–4.0)
Lymphocytes Relative: 25 % (ref 12–46)
MCH: 28.3 pg (ref 26.0–34.0)
MCHC: 33.1 g/dL (ref 30.0–36.0)
MCV: 85.5 fL (ref 78.0–100.0)
MONO ABS: 1 10*3/uL (ref 0.1–1.0)
MONOS PCT: 10 % (ref 3–12)
NEUTROS PCT: 63 % (ref 43–77)
Neutro Abs: 6.2 10*3/uL (ref 1.7–7.7)
Platelets: 101 10*3/uL — ABNORMAL LOW (ref 150–400)
RBC: 5.58 MIL/uL — ABNORMAL HIGH (ref 3.87–5.11)
RDW: 16.1 % — ABNORMAL HIGH (ref 11.5–15.5)
WBC: 9.8 10*3/uL (ref 4.0–10.5)

## 2014-05-30 LAB — FERRITIN: Ferritin: 112 ng/mL (ref 10–291)

## 2014-05-30 IMAGING — CR DG FOOT COMPLETE 3+V*L*
3 series · 3 of 3 positions shown · non-contrast
Comparison: None.

CLINICAL DATA: Fall, pain and bruising.

LEFT FOOT - COMPLETE 3+ VIEW

[view not recorded (1 of 3)]
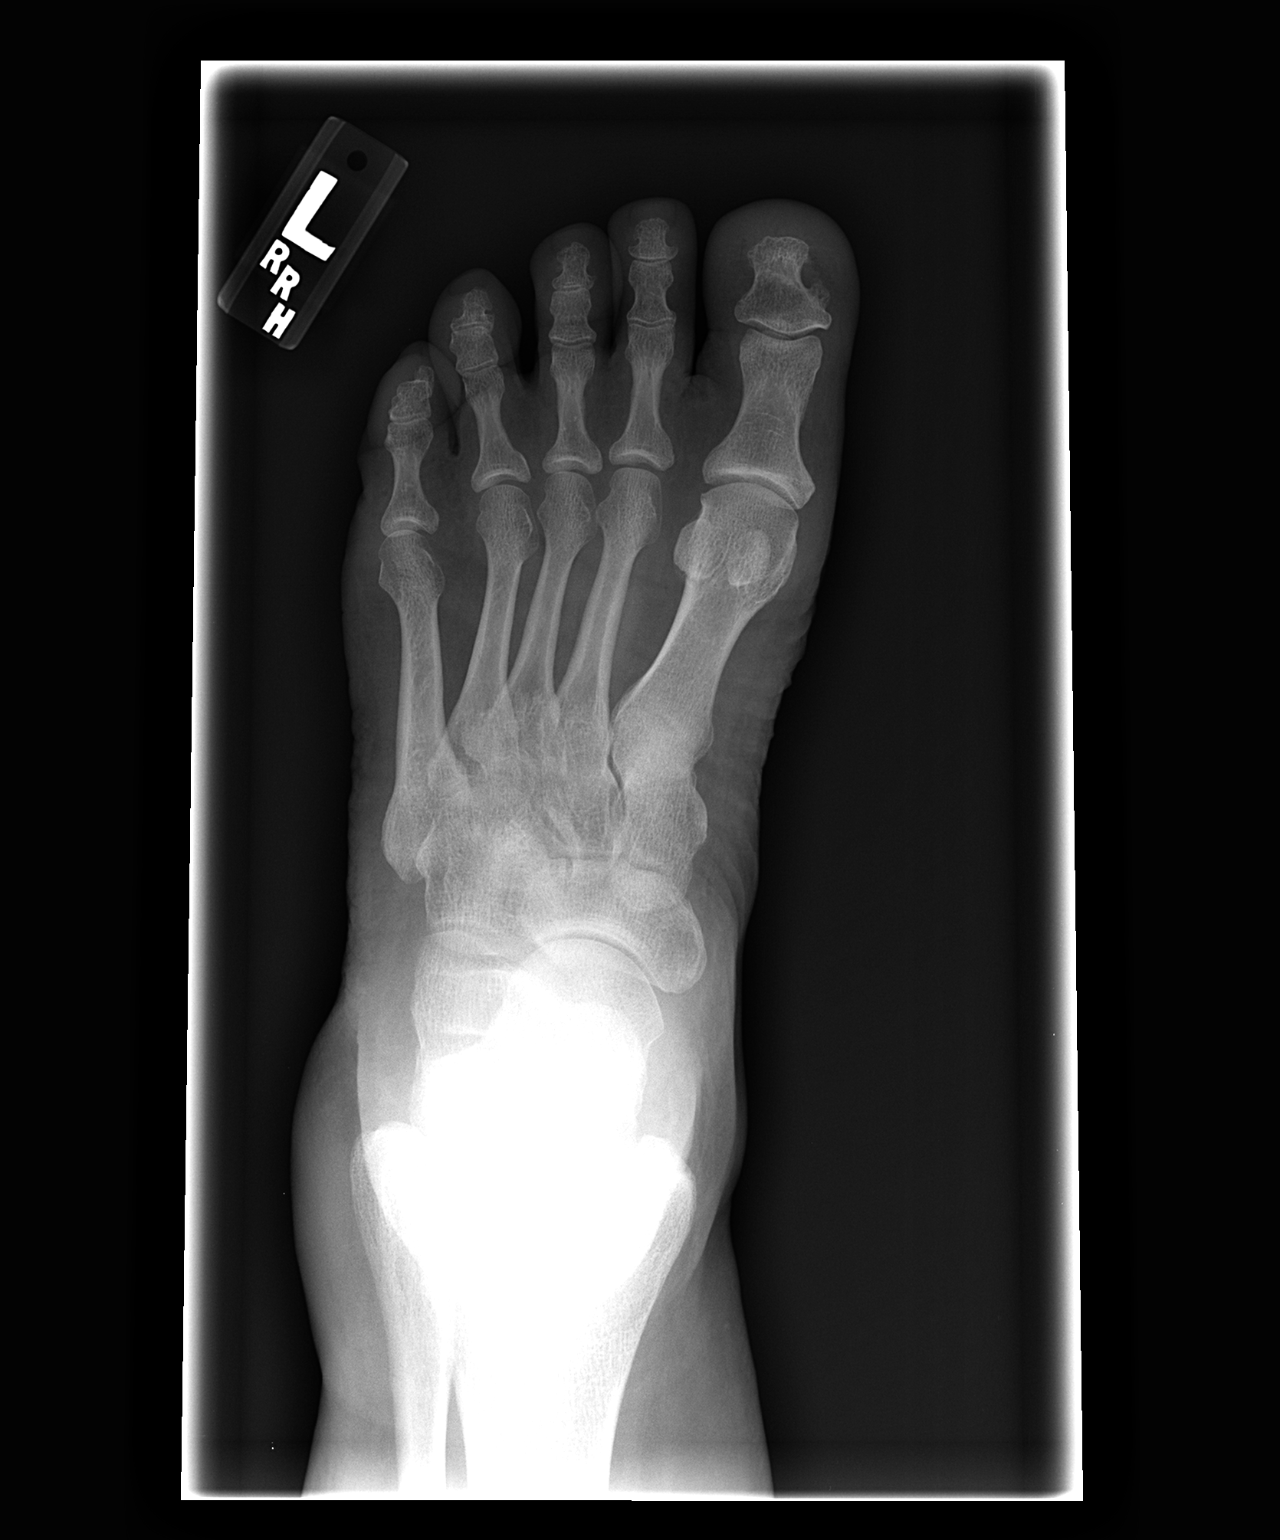

[view not recorded (2 of 3)]
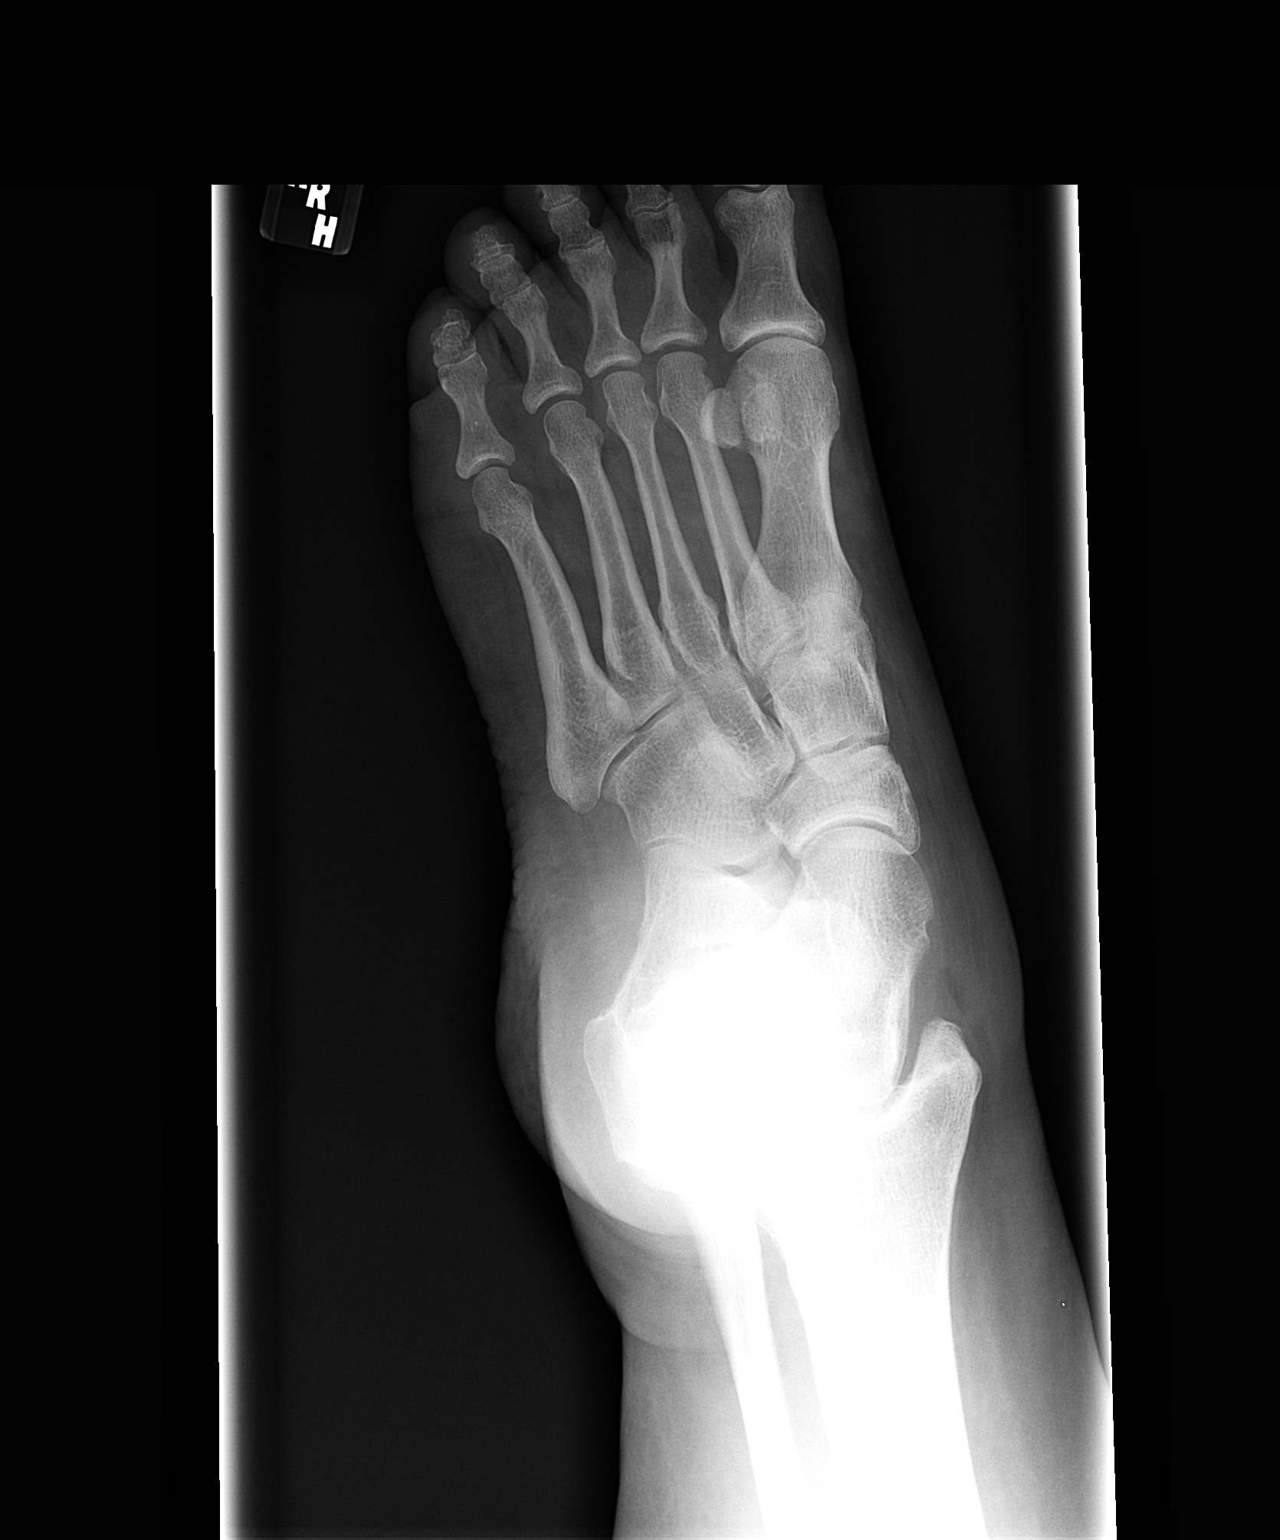

[view not recorded (3 of 3)]
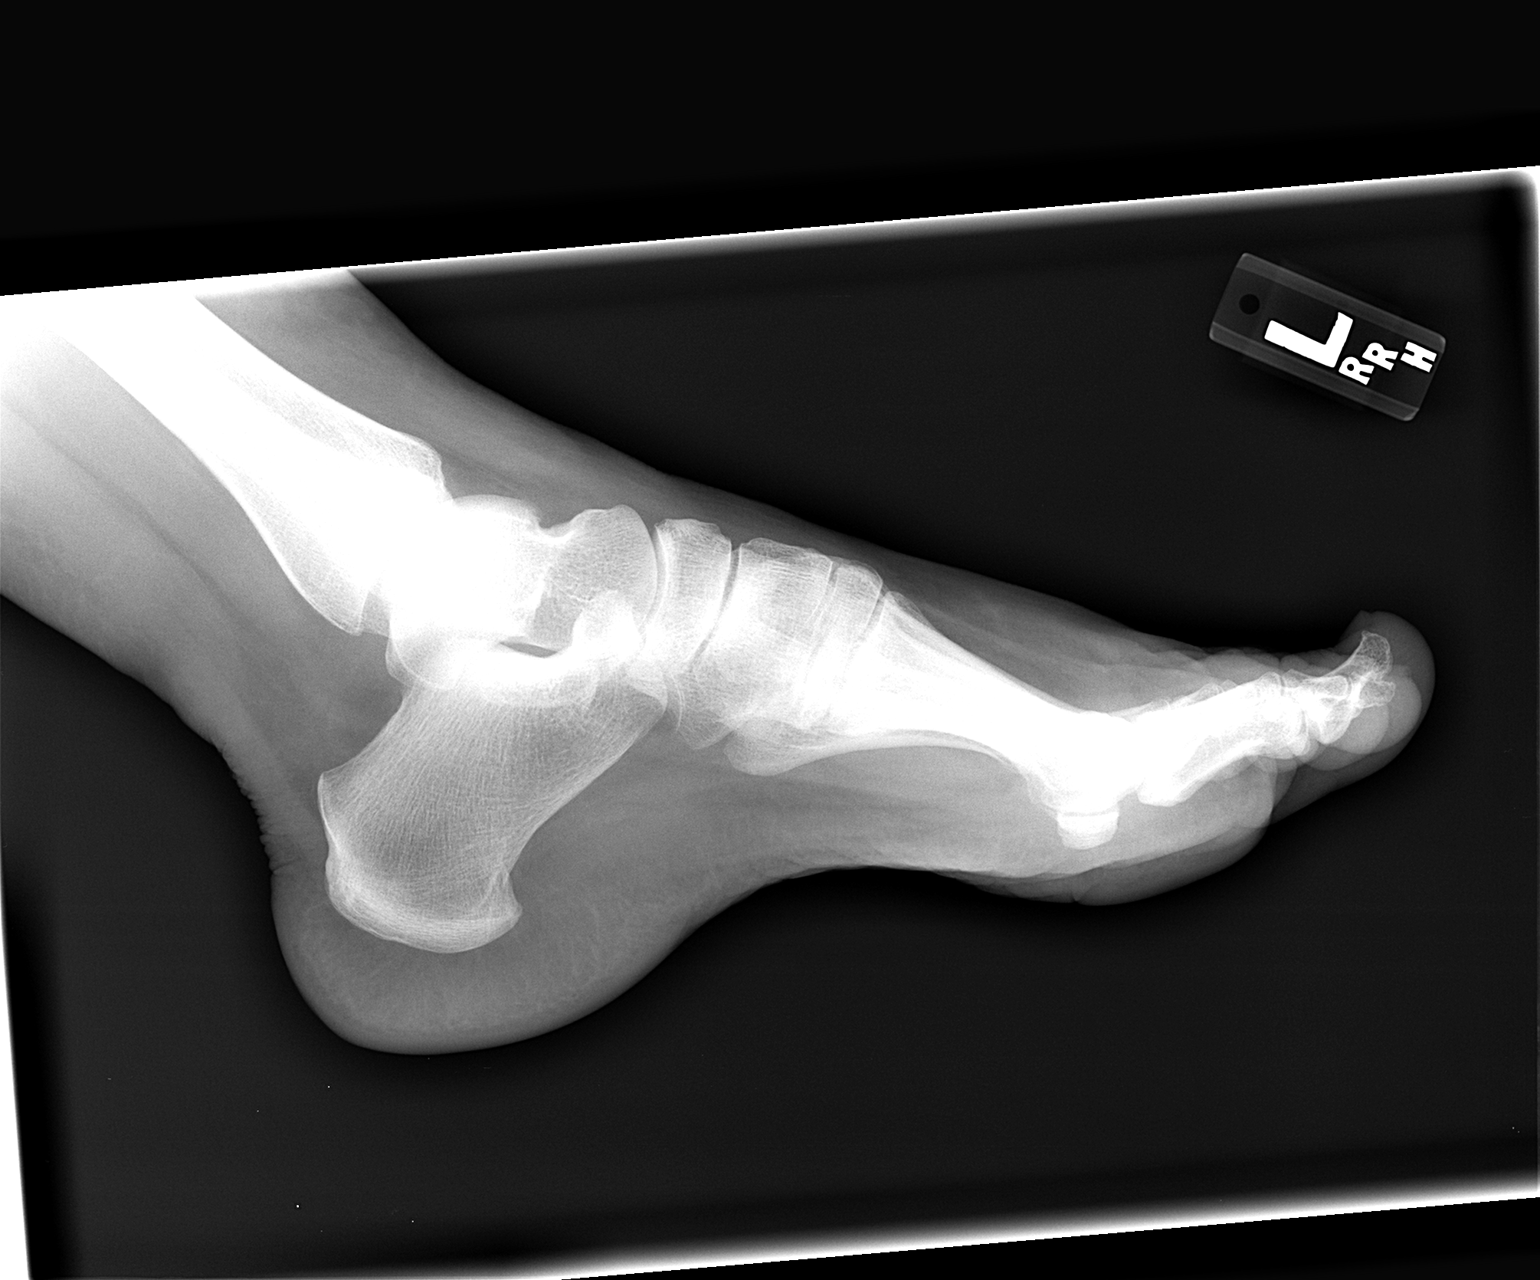

[3 of 3 positions shown; findings below may reference images not displayed]

FINDINGS: Imaged bones, joints and soft tissues appear normal.
IMPRESSION: Negative exam.

## 2014-05-30 NOTE — Progress Notes (Signed)
[facility]  OFFICE PROGRESS NOTE  Glo Herring., MD [place] [address]  DIAGNOSIS: ITP (idiopathic thrombocytopenic purpura) - Plan: DG Bone Density, CANCELED: HM DEXA SCAN  Post-splenectomy  Iron deficiency  Chief Complaint  Patient presents with  . ITP  . Iron deficiency    CURRENT THERAPY: IV Feraheme on 09/01/2013, dexamethasone 0.5 mg twice a day.  INTERVAL HISTORY: Summer Cook 48 y.o. female returns for followup of immune thrombocytopenia while on dexamethasone therapy with concurrent iron deficiency, status post iron infusion on 09/01/2013.  Patient has had intermittent diarrhea associated with ingestion of food. She was seen in the emergency room on 04/30/2014 at which time her platelet count was under 20,000. She is taking to dexamethasone 0.5 mg each day. She denies any fever, night sweats, and still bruises easily. She denies any worsening cough or shortness of breath, nasal drip, sore throat, earache, or skin rash.     MEDICAL HISTORY: Past Medical History  Diagnosis Date  . Essential hypertension, benign   . 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  Last was Prednisone $RemoveBefore'40mg'[id number]$ ; 2 wk in 10/2012.  She also was on Prednisone bridged with Cellcept briefly but stopped due to lack of insurance.   . Depression   . Thrush, oral 05/29/2011  . Steroid-induced diabetes   . Pulmonary embolism 04/2012  . Asthma   . Renal insufficiency   . Chronic chest pain   . Normal cardiac stress test 02/2014    INTERIM HISTORY: has Hypertension; ITP (idiopathic thrombocytopenic purpura); Tachycardia; Hypokalemia; Hemorrhoids; Drug-induced hyperglycemia; Post-splenectomy; Immunosuppressed status; Depression; Obesity; Headache; Renal insufficiency; PE (pulmonary embolism); Rectal bleeding; Abdominal pain, epigastric;  Melena; Chronic anticoagulation; Unspecified constipation; Nausea with vomiting; Diarrhea; Abnormal LFTs; Hyponatremia; Orthostatic hypotension; Chest pain; Dehydration; Acute renal failure; and CKD (chronic kidney disease) stage 3, GFR 30-59 ml/min on her problem list.   08/2010: Treated with Prednisone, IVIg (transient response), Rituxan (no response), Cytoxan (no response). Last was Prednisone $RemoveBefore'40mg'[id number]$ ; 2 wk in 10/2012. She also was on Prednisone bridged with Cellcept briefly but stopped due to lack of insurance. Currently taking dexamethasone 1 mg daily   ALLERGIES:  is allergic to yellow jacket venom.  MEDICATIONS: has a current medication list which includes the following prescription(s): albuterol, alprazolam, calcitonin (salmon), dexamethasone, furosemide, hydrocodone-acetaminophen, insulin aspart, ipratropium-albuterol, metoprolol succinate, mometasone, montelukast, multivitamin with minerals, potassium chloride sa, sertraline, vitamin b-12, hydrocodone bitartrate er, and ondansetron.  SURGICAL HISTORY:  Past Surgical History  Procedure Laterality Date  . Cholecystectomy  2008  . Bone marrow biopsy    . 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 Colonoscopy with esophagogastroduodenoscopy (egd) N/A 01/04/2013    Dr. Gala Romney: esophageal plaques with +KOH, hh. Gastric antrum abnormal, bx reactive gastropathy. Anal canal hemorrhoids, colonic tics, normal TI, single polyp (sessile serrated tubular adenoma). Next TCS 12/2017  . Cataract extraction      FAMILY HISTORY: family history includes AAA (abdominal aortic aneurysm) in her paternal grandmother; Cancer (age of onset: 48) in her paternal grandfather; Cancer (age of onset: 17) in her paternal grandmother; Cervical cancer in her mother; Lung cancer in her father; Pneumonia in her brother.  SOCIAL HISTORY:  reports that she quit smoking about 5 years ago. Her smoking use included Cigarettes. She smoked 0.00 packs per day for 0 years. She has never used  smokeless tobacco. She reports that she  does not drink alcohol or use illicit drugs.  REVIEW OF SYSTEMS:  Other than that discussed above is noncontributory.  PHYSICAL EXAMINATION: ECOG PERFORMANCE STATUS: 1 - Symptomatic but completely ambulatory  Blood pressure 100/72, pulse 83, temperature 98.9 F (37.2 C), temperature source Oral, resp. rate 18, weight 219 lb (99.338 kg), last menstrual period 05/13/2011, SpO2 98.00%.  GENERAL:alert, no distress and comfortable. Moderately obese. Cushingoid appearance. SKIN: skin color, texture, turgor are normal, no rashes or significant lesions EYES: PERLA; Conjunctiva are pink and non-injected, sclera clear SINUSES: No redness or tenderness over maxillary or ethmoid sinuses OROPHARYNX:no exudate, no erythema on lips, buccal mucosa, or tongue. NECK: supple, thyroid normal size, non-tender, without nodularity. No masses CHEST: Increased AP diameter with no breast masses. LYMPH:  no palpable lymphadenopathy in the cervical, axillary or inguinal LUNGS: clear to auscultation and percussion with normal breathing effort HEART: regular rate & rhythm and no murmurs. ABDOMEN:abdomen soft, non-tender and normal bowel sounds MUSCULOSKELETAL:no cyanosis of digits and no clubbing. Range of motion normal upper extremity ecchymoses around the elbow..  NEURO: alert & oriented x 3 with fluent speech, no focal motor/sensory deficits   LABORATORY DATA: Lab on 05/30/2014  Component Date Value Ref Range Status  . WBC 05/30/2014 9.8  4.0 - 10.5 K/uL Final  . RBC 05/30/2014 5.58* 3.87 - 5.11 MIL/uL Final  . Hemoglobin 05/30/2014 15.8* 12.0 - 15.0 g/dL Final  . HCT 65/87/1841 47.7* 36.0 - 46.0 % Final  . MCV 05/30/2014 85.5  78.0 - 100.0 fL Final  . MCH 05/30/2014 28.3  26.0 - 34.0 pg Final  . MCHC 05/30/2014 33.1  30.0 - 36.0 g/dL Final  . RDW 08/57/9079 16.1* 11.5 - 15.5 % Final  . Platelets 05/30/2014 101* 150 - 400 K/uL Final   Comment: SPECIMEN CHECKED FOR  CLOTS                          CONSISTENT WITH PREVIOUS RESULT  . Neutrophils Relative % 05/30/2014 63  43 - 77 % Final  . Neutro Abs 05/30/2014 6.2  1.7 - 7.7 K/uL Final  . Lymphocytes Relative 05/30/2014 25  12 - 46 % Final  . Lymphs Abs 05/30/2014 2.5  0.7 - 4.0 K/uL Final  . Monocytes Relative 05/30/2014 10  3 - 12 % Final  . Monocytes Absolute 05/30/2014 1.0  0.1 - 1.0 K/uL Final  . Eosinophils Relative 05/30/2014 1  0 - 5 % Final  . Eosinophils Absolute 05/30/2014 0.1  0.0 - 0.7 K/uL Final  . Basophils Relative 05/30/2014 1  0 - 1 % Final  . Basophils Absolute 05/30/2014 0.1  0.0 - 0.1 K/uL Final  . D-Dimer, Quant 05/30/2014 0.72* 0.00 - 0.48 ug/mL-FEU Final   Comment:                                 AT THE INHOUSE ESTABLISHED CUTOFF                          VALUE OF 0.48 ug/mL FEU,                          THIS ASSAY HAS BEEN DOCUMENTED                          IN THE  LITERATURE TO HAVE                          A SENSITIVITY AND NEGATIVE                          PREDICTIVE VALUE OF AT LEAST                          98 TO 99%.  THE TEST RESULT                          SHOULD BE CORRELATED WITH                          AN ASSESSMENT OF THE CLINICAL                          PROBABILITY OF DVT / VTE.  Admission on 04/30/2014, Discharged on 05/01/2014  Component Date Value Ref Range Status  . WBC 04/30/2014 9.0  4.0 - 10.5 K/uL Final  . RBC 04/30/2014 5.21* 3.87 - 5.11 MIL/uL Final  . Hemoglobin 04/30/2014 14.8  12.0 - 15.0 g/dL Final  . HCT 04/30/2014 45.4  36.0 - 46.0 % Final  . MCV 04/30/2014 87.1  78.0 - 100.0 fL Final  . MCH 04/30/2014 28.4  26.0 - 34.0 pg Final  . MCHC 04/30/2014 32.6  30.0 - 36.0 g/dL Final  . RDW 04/30/2014 14.9  11.5 - 15.5 % Final  . Platelets 04/30/2014 120* 150 - 400 K/uL Final  . Neutrophils Relative % 04/30/2014 55  43 - 77 % Final  . Neutro Abs 04/30/2014 4.9  1.7 - 7.7 K/uL Final  . Lymphocytes Relative 04/30/2014 33  12 - 46 % Final  . Lymphs  Abs 04/30/2014 2.9  0.7 - 4.0 K/uL Final  . Monocytes Relative 04/30/2014 11  3 - 12 % Final  . Monocytes Absolute 04/30/2014 1.0  0.1 - 1.0 K/uL Final  . Eosinophils Relative 04/30/2014 1  0 - 5 % Final  . Eosinophils Absolute 04/30/2014 0.1  0.0 - 0.7 K/uL Final  . Basophils Relative 04/30/2014 0  0 - 1 % Final  . Basophils Absolute 04/30/2014 0.0  0.0 - 0.1 K/uL Final  . Sodium 04/30/2014 142  137 - 147 mEq/L Final  . Potassium 04/30/2014 4.4  3.7 - 5.3 mEq/L Final   HEMOLYSIS AT THIS LEVEL MAY AFFECT RESULT  . Chloride 04/30/2014 106  96 - 112 mEq/L Final  . CO2 04/30/2014 22  19 - 32 mEq/L Final  . Glucose, Bld 04/30/2014 87  70 - 99 mg/dL Final  . BUN 04/30/2014 20  6 - 23 mg/dL Final  . Creatinine, Ser 04/30/2014 1.15* 0.50 - 1.10 mg/dL Final  . Calcium 04/30/2014 9.0  8.4 - 10.5 mg/dL Final  . Total Protein 04/30/2014 6.4  6.0 - 8.3 g/dL Final  . Albumin 04/30/2014 3.4* 3.5 - 5.2 g/dL Final  . AST 04/30/2014 41* 0 - 37 U/L Final   HEMOLYSIS AT THIS LEVEL MAY AFFECT RESULT  . ALT 04/30/2014 41* 0 - 35 U/L Final   HEMOLYSIS AT THIS LEVEL MAY AFFECT RESULT  . Alkaline Phosphatase 04/30/2014 48  39 - 117 U/L Final  . Total Bilirubin 04/30/2014 0.4  0.3 - 1.2 mg/dL Final  .  GFR calc non Af Amer 04/30/2014 55* >90 mL/min Final  . GFR calc Af Amer 04/30/2014 64* >90 mL/min Final   Comment: (NOTE)                          The eGFR has been calculated using the CKD EPI equation.                          This calculation has not been validated in all clinical situations.                          eGFR's persistently <90 mL/min signify possible Chronic Kidney                          Disease.  . Anion gap 04/30/2014 14  5 - 15 Final  . Lipase 04/30/2014 138* 11 - 59 U/L Final  . Lactic Acid, Venous 04/30/2014 1.06  0.5 - 2.2 mmol/L Final  . Fecal Occult Bld 04/30/2014 POSITIVE* NEGATIVE Final  . Troponin i, poc 05/01/2014 0.00  0.00 - 0.08 ng/mL Final  . Comment 3 05/01/2014           Final   Comment: Due to the release kinetics of cTnI,                          a negative result within the first hours                          of the onset of symptoms does not rule out                          myocardial infarction with certainty.                          If myocardial infarction is still suspected,                          repeat the test at appropriate intervals.    PATHOLOGY: No new pathology.  Urinalysis    Component Value Date/Time   COLORURINE YELLOW 04/28/2014 1315   APPEARANCEUR CLEAR 04/28/2014 1315   LABSPEC 1.015 04/28/2014 1315   PHURINE 7.0 04/28/2014 1315   GLUCOSEU NEGATIVE 04/28/2014 1315   HGBUR SMALL* 04/28/2014 1315   BILIRUBINUR NEGATIVE 04/28/2014 1315   KETONESUR NEGATIVE 04/28/2014 1315   PROTEINUR NEGATIVE 04/28/2014 1315   UROBILINOGEN 0.2 04/28/2014 1315   NITRITE NEGATIVE 04/28/2014 1315   LEUKOCYTESUR MODERATE* 04/28/2014 1315    RADIOGRAPHIC STUDIES: No results found.  ASSESSMENT:  #1. Chronic immune thrombocytopenia, requiring 1 mg of dexamethasone daily, status post splenectomy, decrease in platelet count from April probably due to allergen exposure and activation of the immune system.  #2. 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  #3. Hypertension, controlled.  #4. Diabetes mellitus, type II, insulin requiring, controlled. Fasting blood sugars in the 110 mg percent range . #5. Gastrocolic reflex    PLAN:  #1. Continue dexamethasone 1 mg daily. #2. DEXA scan. #3. PROLIA subcutaneously every 6 months if osteoporosis or osteopenia is found since the patient will be on  dexamethasone for the rest of her life. #4. She was told to call should bruising become more severe, bleeding occurs, or should she develop fever. #5. Followup in 6 months with CBC and ferritin.   All questions were answered. The patient knows to call the clinic with any problems, questions or  concerns. We can certainly see the patient much sooner if necessary.   I spent 30 minutes counseling the patient face to face. The total time spent in the appointment was 40 minutes.    Doroteo Bradford, MD 05/30/2014 1:51 PM  DISCLAIMER:  This note was dictated with voice recognition software.  Similar sounding words can inadvertently be transcribed inaccurately and may not be corrected upon review.

## 2014-05-30 NOTE — Patient Instructions (Signed)
[facility] Discharge Instructions  RECOMMENDATIONS MADE BY THE CONSULTANT AND ANY TEST RESULTS WILL BE SENT TO YOUR REFERRING PHYSICIAN.  EXAM FINDINGS BY THE PHYSICIAN TODAY AND SIGNS OR SYMPTOMS TO REPORT TO CLINIC OR PRIMARY PHYSICIAN: Exam and findings as discussed by Dr. Barnet Glasgow - he suspects you may have gastrocolic reflux.  INSTRUCTIONS/FOLLOW-UP:  Return in 6 months for office visit and lab work. We will schedule a bone marrow density and call you with the appointment time.   Thank you for choosing [facility] to provide your oncology and hematology care.  To afford each patient quality time with our providers, please arrive at least 15 minutes before your scheduled appointment time.  With your help, our goal is to use those 15 minutes to complete the necessary work-up to ensure our physicians have the information they need to help with your evaluation and healthcare recommendations.    Effective January 1st, 2014, we ask that you re-schedule your appointment with our physicians should you arrive 10 or more minutes late for your appointment.  We strive to give you quality time with our providers, and arriving late affects you and other patients whose appointments are after yours.    Again, thank you for choosing [facility].  Our hope is that these requests will decrease the amount of time that you wait before being seen by our physicians.       _____________________________________________________________  Should you have questions after your visit to [facility], please contact our office at [phone] between the hours of 8:30 a.m. and 4:30 p.m.  Voicemails left after 4:30 p.m. will not be returned until the following business day.  For prescription refill requests, have your pharmacy contact our office with your prescription refill request.    _______________________________________________________________  We hope  that we have given you very good care.  You may receive a patient satisfaction survey in the mail, please complete it and return it as soon as possible.  We value your feedback!  _______________________________________________________________  Have you asked about our STAR program?  STAR stands for Survivorship Training and Rehabilitation, and this is a nationally recognized cancer care program that focuses on survivorship and rehabilitation.  Cancer and cancer treatments may cause problems, such as, pain, making you feel tired and keeping you from doing the things that you need or want to do. Cancer rehabilitation can help. Our goal is to reduce these troubling effects and help you have the best quality of life possible.  You may receive a survey from a nurse that asks questions about your current state of health.  Based on the survey results, all eligible patients will be referred to the [facility] program for an evaluation so we can better serve you!  A frequently asked questions sheet is available upon request.

## 2014-05-30 NOTE — Progress Notes (Signed)
Labs drawn for cbcd,ferr,ddim

## 2014-05-31 DIAGNOSIS — J0100 Acute maxillary sinusitis, unspecified: Secondary | ICD-10-CM | POA: Diagnosis not present

## 2014-05-31 DIAGNOSIS — E2749 Other adrenocortical insufficiency: Secondary | ICD-10-CM | POA: Diagnosis not present

## 2014-05-31 DIAGNOSIS — Z6835 Body mass index (BMI) 35.0-35.9, adult: Secondary | ICD-10-CM | POA: Diagnosis not present

## 2014-05-31 DIAGNOSIS — J209 Acute bronchitis, unspecified: Secondary | ICD-10-CM | POA: Diagnosis not present

## 2014-05-31 DIAGNOSIS — I10 Essential (primary) hypertension: Secondary | ICD-10-CM | POA: Diagnosis not present

## 2014-06-05 ENCOUNTER — Other Ambulatory Visit (HOSPITAL_COMMUNITY): Payer: Self-pay | Admitting: Hematology and Oncology

## 2014-06-05 DIAGNOSIS — IMO0002 Reserved for concepts with insufficient information to code with codable children: Secondary | ICD-10-CM

## 2014-06-05 DIAGNOSIS — D693 Immune thrombocytopenic purpura: Secondary | ICD-10-CM

## 2014-06-05 DIAGNOSIS — Z9189 Other specified personal risk factors, not elsewhere classified: Secondary | ICD-10-CM

## 2014-06-06 ENCOUNTER — Ambulatory Visit (HOSPITAL_COMMUNITY)
Admission: RE | Admit: 2014-06-06 | Discharge: 2014-06-06 | Disposition: A | Discharge status: Home / Self Care | Payer: Medicare Other | Source: Ambulatory Visit | Attending: Hematology and Oncology | Admitting: Hematology and Oncology

## 2014-06-06 DIAGNOSIS — M549 Dorsalgia, unspecified: Secondary | ICD-10-CM | POA: Diagnosis not present

## 2014-06-06 DIAGNOSIS — IMO0002 Reserved for concepts with insufficient information to code with codable children: Secondary | ICD-10-CM | POA: Diagnosis not present

## 2014-06-06 DIAGNOSIS — E119 Type 2 diabetes mellitus without complications: Secondary | ICD-10-CM | POA: Diagnosis not present

## 2014-06-06 DIAGNOSIS — Z789 Other specified health status: Secondary | ICD-10-CM | POA: Diagnosis not present

## 2014-06-06 DIAGNOSIS — Z9189 Other specified personal risk factors, not elsewhere classified: Secondary | ICD-10-CM

## 2014-06-06 DIAGNOSIS — Z780 Asymptomatic menopausal state: Secondary | ICD-10-CM | POA: Diagnosis not present

## 2014-06-06 DIAGNOSIS — D693 Immune thrombocytopenic purpura: Secondary | ICD-10-CM | POA: Diagnosis not present

## 2014-06-06 DIAGNOSIS — G8929 Other chronic pain: Secondary | ICD-10-CM | POA: Diagnosis not present

## 2014-06-06 DIAGNOSIS — Z13820 Encounter for screening for osteoporosis: Secondary | ICD-10-CM | POA: Diagnosis not present

## 2014-06-07 ENCOUNTER — Encounter: Payer: Medicare Other | Admitting: Gastroenterology

## 2014-06-07 NOTE — Progress Notes (Signed)
   Subjective:    Patient ID: Summer Cook, female    DOB: June 10, 1966, 48 y.o.   MRN: [id number]  HPI   Past Medical History  Diagnosis Date  . Essential hypertension, benign   . 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  Last was Prednisone 40mg ; 2 wk in 10/2012.  She also was on Prednisone bridged with Cellcept briefly but stopped due to lack of insurance.   . Depression   . Thrush, oral 05/29/2011  . Steroid-induced diabetes   . Pulmonary embolism 04/2012  . Asthma   . Renal insufficiency   . Chronic chest pain   . Normal cardiac stress test 02/2014    Review of Systems     Objective:   Physical Exam        Assessment & Plan:

## 2014-06-08 ENCOUNTER — Other Ambulatory Visit (HOSPITAL_COMMUNITY): Payer: Self-pay | Admitting: Hematology and Oncology

## 2014-06-08 DIAGNOSIS — Z1231 Encounter for screening mammogram for malignant neoplasm of breast: Secondary | ICD-10-CM

## 2014-06-14 ENCOUNTER — Ambulatory Visit (HOSPITAL_COMMUNITY): Payer: Medicare Other

## 2014-06-15 DIAGNOSIS — Z23 Encounter for immunization: Secondary | ICD-10-CM | POA: Diagnosis not present

## 2014-06-27 ENCOUNTER — Encounter: Payer: Medicare Other | Admitting: Gastroenterology

## 2014-06-27 ENCOUNTER — Encounter: Payer: Self-pay | Admitting: Gastroenterology

## 2014-06-27 NOTE — Progress Notes (Signed)
   Subjective:    Patient ID: Summer Cook, female    DOB: 1966/04/01, 48 y.o.   MRN: [id number]  HPI    Past Medical History  Diagnosis Date  . Essential hypertension, benign   . 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  Last was Prednisone 40mg ; 2 wk in 10/2012.  She also was on Prednisone bridged with Cellcept briefly but stopped due to lack of insurance.   . Depression   . Thrush, oral 05/29/2011  . Steroid-induced diabetes   . Pulmonary embolism 04/2012  . Asthma   . Renal insufficiency   . Chronic chest pain   . Normal cardiac stress test 02/2014     Review of Systems     Objective:   Physical Exam        Assessment & Plan:

## 2014-06-29 IMAGING — CT CT ABD-PELV W/O CM
2 of 4 series · 17 of 46 positions shown, 19 images · non-contrast
Comparison: 10/08/2010 CT

CLINICAL DATA: 47-year-old female with abdominal pelvic pain.
History of splenectomy.

CT ABDOMEN AND PELVIS WITHOUT CONTRAST
TECHNIQUE: Multidetector CT imaging of the abdomen and pelvis was
performed following the standard protocol without intravenous
contrast.

[Series 2: abdomen/pelvis w/o contrast · axial · non-contrast · 0.78mm/px · z∈[-484,-74]mm · 14 of 96 slices shown, 16 images]
[im 7/96  soft-tissue]
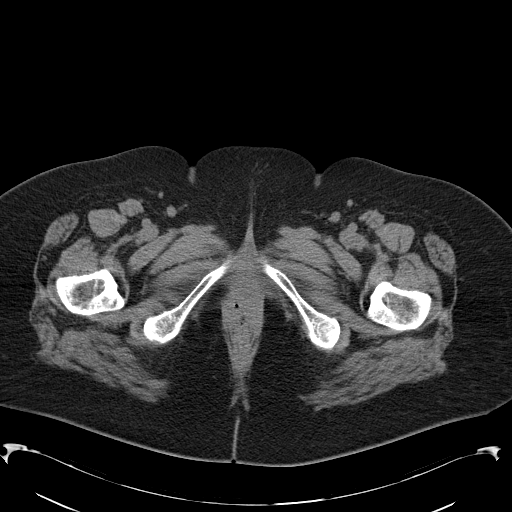
[im 7/96  bone]
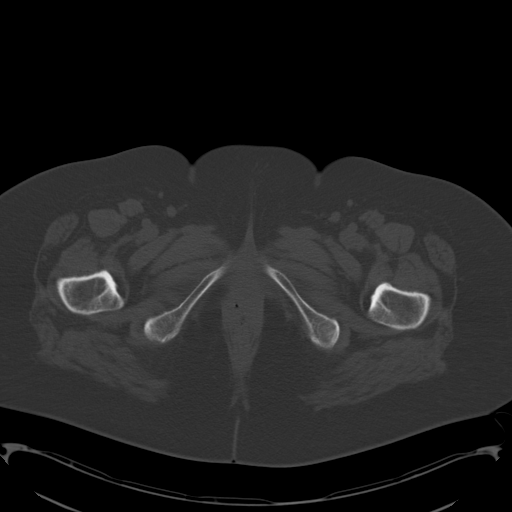
[im 13/96  soft-tissue]
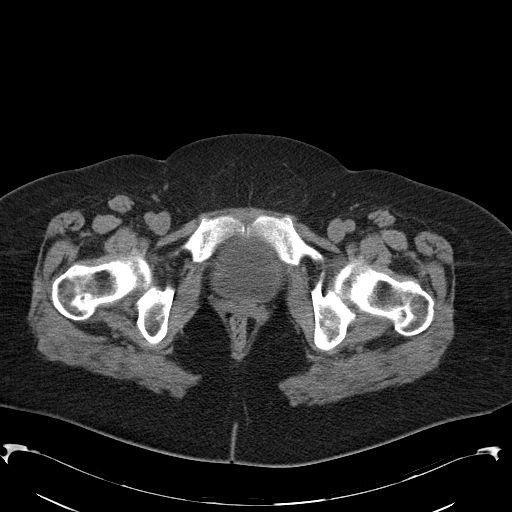
[im 20/96  soft-tissue]
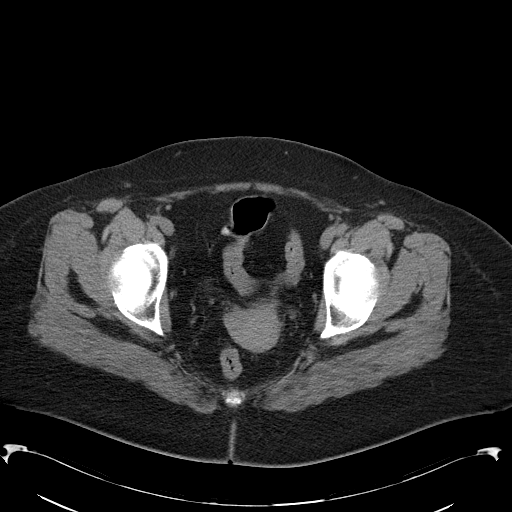
[im 26/96  soft-tissue]
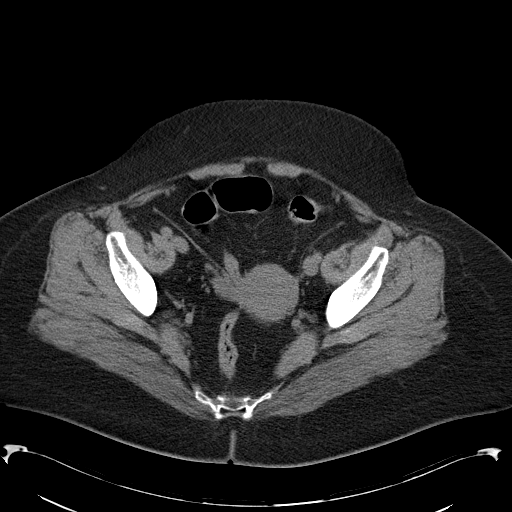
[im 32/96  soft-tissue]
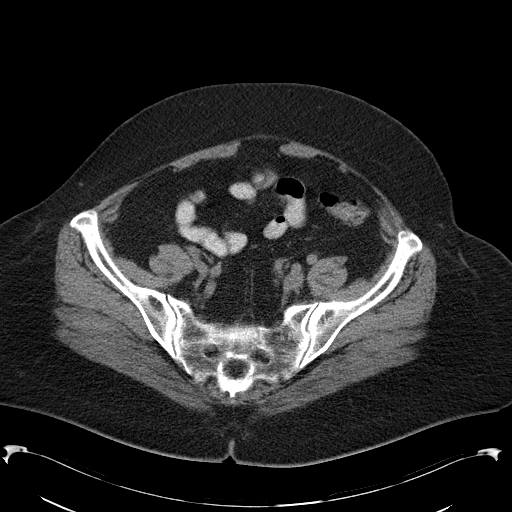
[im 39/96  soft-tissue]
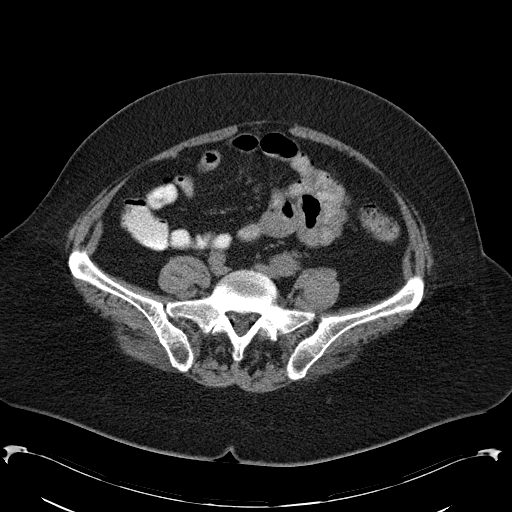
[im 45/96  soft-tissue]
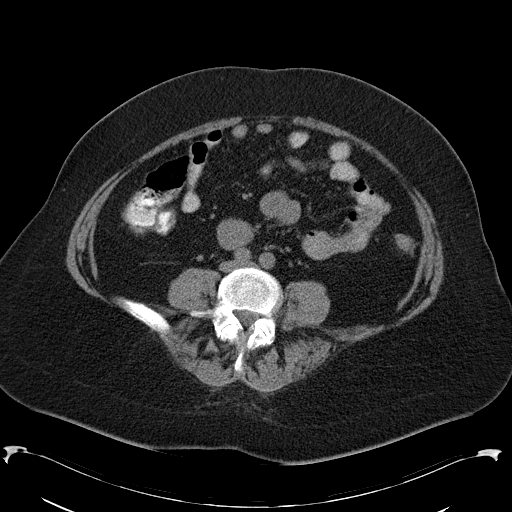
[im 51/96  soft-tissue]
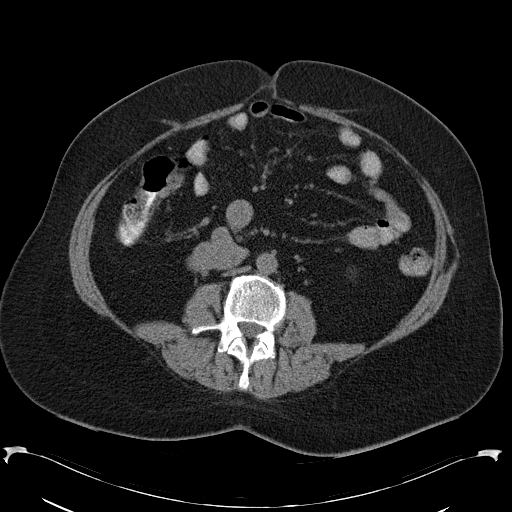
[im 58/96  soft-tissue]
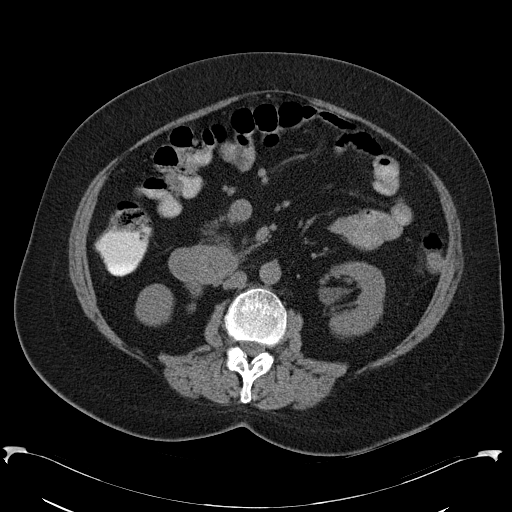
[im 58/96  bone]
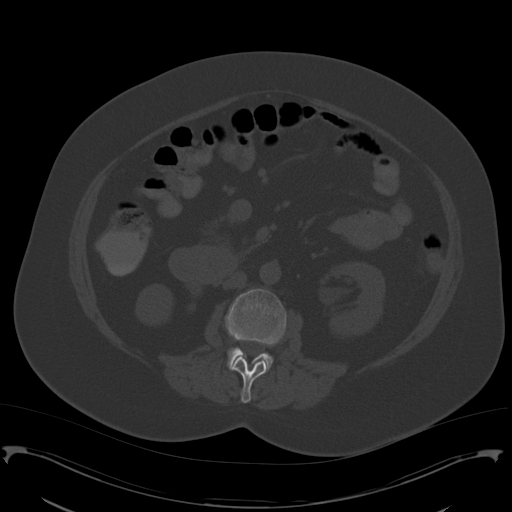
[im 64/96  soft-tissue]
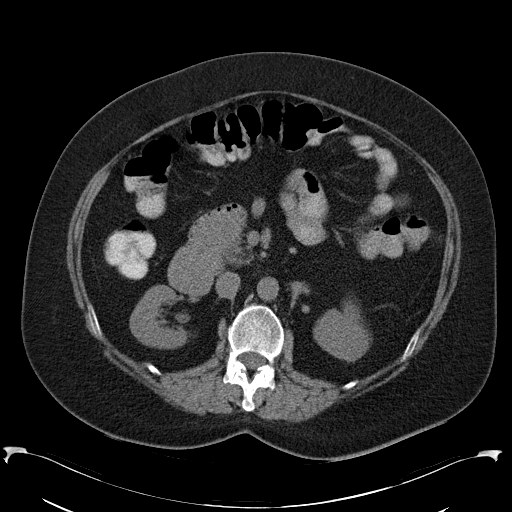
[im 70/96  soft-tissue]
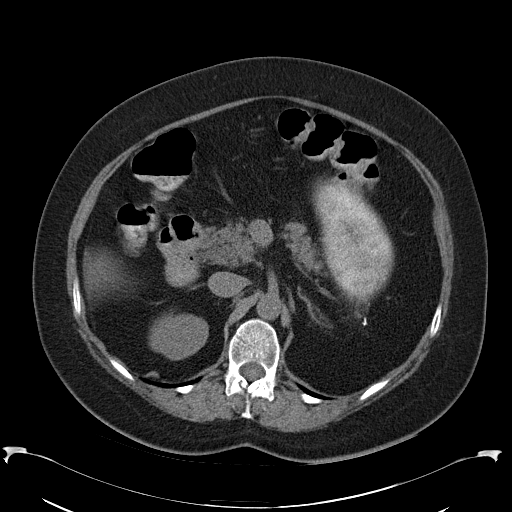
[im 77/96  soft-tissue]
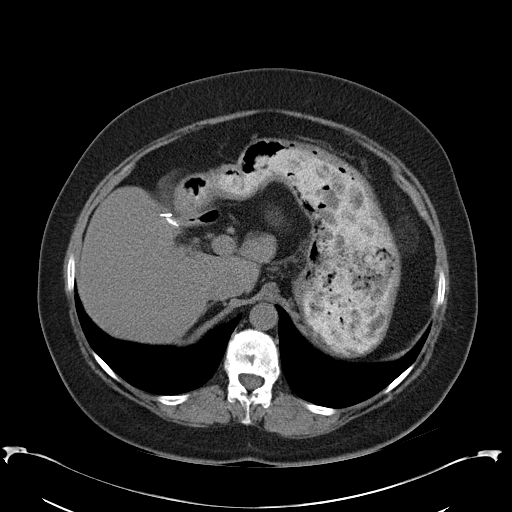
[im 83/96  soft-tissue]
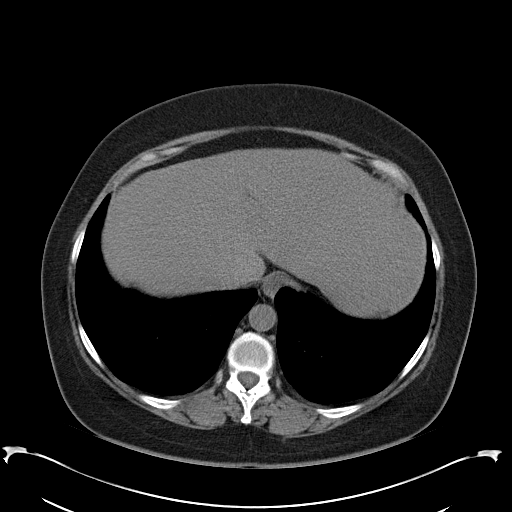
[im 89/96  soft-tissue]
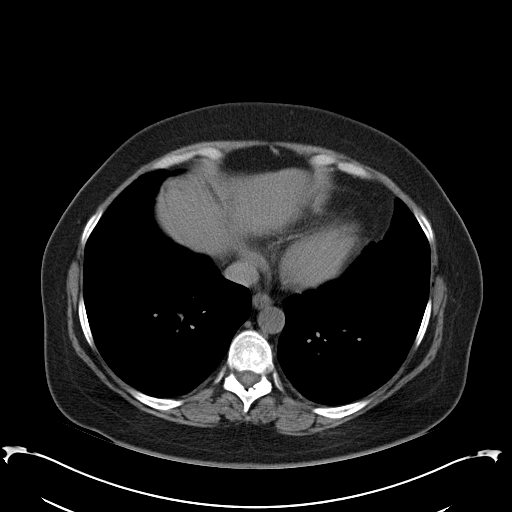

[Series 4: mpr cor (id) · coronal · 0.76mm/px · 3 of 102 slices shown]
[im 34/102  soft-tissue]
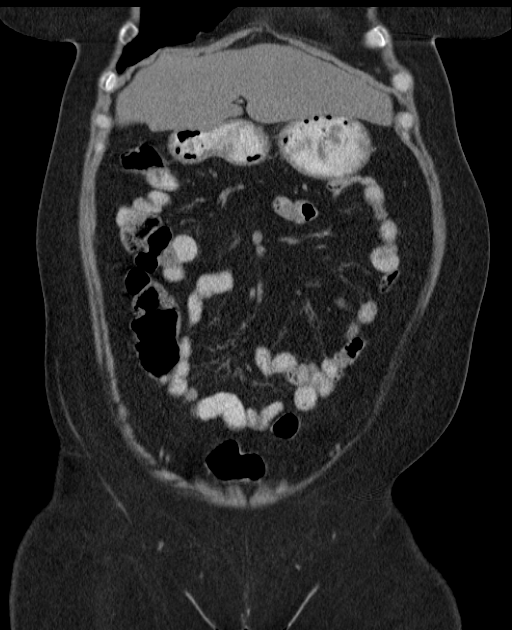
[im 45/102  soft-tissue]
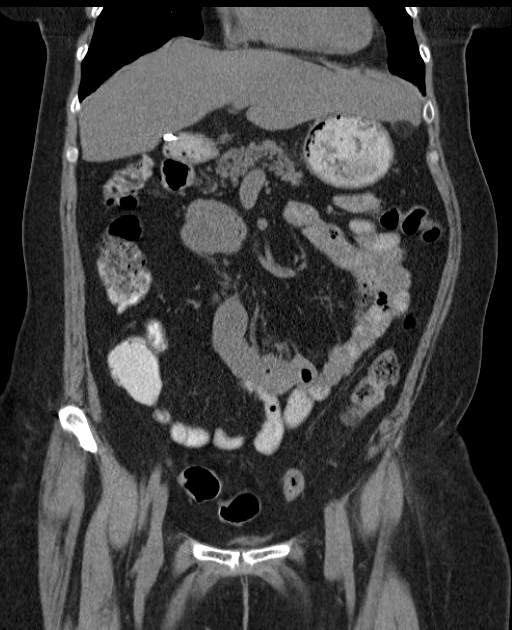
[im 57/102  soft-tissue]
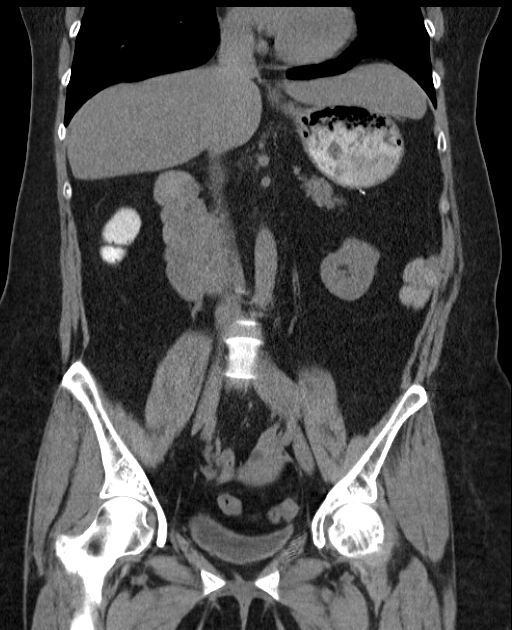

[17 of 46 positions shown; findings below may reference images not displayed]

FINDINGS: The lung bases are clear.

The liver, pancreas, kidneys and adrenal glands are unremarkable.
The patient is status post cholecystectomy and splenectomy.

Please note that parenchymal abnormalities may be missed as
intravenous contrast was not administered.

Small bowel malrotation is again identified proximally without
evidence of bowel obstruction or volvulus. No other bowel
abnormalities are identified.
The bladder is unremarkable.

No free fluid, enlarged lymph nodes, biliary dilation or abdominal
aortic aneurysm identified.

There is no evidence of pneumoperitoneum or abscess.
No acute or suspicious bony abnormalities are identified. A
superior endplate compression fracture of T12 is remote.
IMPRESSION: No evidence of acute abnormality.

## 2014-07-27 ENCOUNTER — Emergency Department (HOSPITAL_COMMUNITY): Payer: Medicare Other

## 2014-07-27 ENCOUNTER — Encounter (HOSPITAL_COMMUNITY): Payer: Self-pay | Admitting: Emergency Medicine

## 2014-07-27 ENCOUNTER — Emergency Department (HOSPITAL_COMMUNITY)
Admission: EM | Admit: 2014-07-27 | Discharge: 2014-07-27 | Disposition: A | Discharge status: Home / Self Care | Payer: Medicare Other | Attending: Emergency Medicine | Admitting: Emergency Medicine

## 2014-07-27 DIAGNOSIS — R2243 Localized swelling, mass and lump, lower limb, bilateral: Secondary | ICD-10-CM | POA: Diagnosis not present

## 2014-07-27 DIAGNOSIS — Z794 Long term (current) use of insulin: Secondary | ICD-10-CM | POA: Diagnosis not present

## 2014-07-27 DIAGNOSIS — R609 Edema, unspecified: Secondary | ICD-10-CM

## 2014-07-27 DIAGNOSIS — Z87891 Personal history of nicotine dependence: Secondary | ICD-10-CM | POA: Insufficient documentation

## 2014-07-27 DIAGNOSIS — F329 Major depressive disorder, single episode, unspecified: Secondary | ICD-10-CM | POA: Diagnosis not present

## 2014-07-27 DIAGNOSIS — R0789 Other chest pain: Secondary | ICD-10-CM | POA: Insufficient documentation

## 2014-07-27 DIAGNOSIS — Z789 Other specified health status: Secondary | ICD-10-CM

## 2014-07-27 DIAGNOSIS — Z862 Personal history of diseases of the blood and blood-forming organs and certain disorders involving the immune mechanism: Secondary | ICD-10-CM | POA: Diagnosis not present

## 2014-07-27 DIAGNOSIS — Z8719 Personal history of other diseases of the digestive system: Secondary | ICD-10-CM | POA: Insufficient documentation

## 2014-07-27 DIAGNOSIS — R0602 Shortness of breath: Secondary | ICD-10-CM

## 2014-07-27 DIAGNOSIS — E119 Type 2 diabetes mellitus without complications: Secondary | ICD-10-CM | POA: Diagnosis not present

## 2014-07-27 DIAGNOSIS — R000 Tachycardia, unspecified: Secondary | ICD-10-CM | POA: Diagnosis not present

## 2014-07-27 DIAGNOSIS — H9209 Otalgia, unspecified ear: Secondary | ICD-10-CM | POA: Diagnosis not present

## 2014-07-27 DIAGNOSIS — Z79899 Other long term (current) drug therapy: Secondary | ICD-10-CM | POA: Insufficient documentation

## 2014-07-27 DIAGNOSIS — J45901 Unspecified asthma with (acute) exacerbation: Secondary | ICD-10-CM | POA: Insufficient documentation

## 2014-07-27 DIAGNOSIS — Z86711 Personal history of pulmonary embolism: Secondary | ICD-10-CM | POA: Diagnosis not present

## 2014-07-27 DIAGNOSIS — M79606 Pain in leg, unspecified: Secondary | ICD-10-CM | POA: Diagnosis not present

## 2014-07-27 DIAGNOSIS — Z7952 Long term (current) use of systemic steroids: Secondary | ICD-10-CM | POA: Insufficient documentation

## 2014-07-27 DIAGNOSIS — G8929 Other chronic pain: Secondary | ICD-10-CM | POA: Insufficient documentation

## 2014-07-27 DIAGNOSIS — I10 Essential (primary) hypertension: Secondary | ICD-10-CM | POA: Insufficient documentation

## 2014-07-27 LAB — CBC WITH DIFFERENTIAL/PLATELET
Basophils Absolute: 0.1 10*3/uL (ref 0.0–0.1)
Basophils Relative: 1 % (ref 0–1)
EOS PCT: 2 % (ref 0–5)
Eosinophils Absolute: 0.1 10*3/uL (ref 0.0–0.7)
HEMATOCRIT: 46.3 % — AB (ref 36.0–46.0)
Hemoglobin: 14.9 g/dL (ref 12.0–15.0)
LYMPHS PCT: 24 % (ref 12–46)
Lymphs Abs: 1.9 10*3/uL (ref 0.7–4.0)
MCH: 27.2 pg (ref 26.0–34.0)
MCHC: 32.2 g/dL (ref 30.0–36.0)
MCV: 84.5 fL (ref 78.0–100.0)
MONO ABS: 0.9 10*3/uL (ref 0.1–1.0)
Monocytes Relative: 11 % (ref 3–12)
Neutro Abs: 5.1 10*3/uL (ref 1.7–7.7)
Neutrophils Relative %: 63 % (ref 43–77)
Platelets: 132 10*3/uL — ABNORMAL LOW (ref 150–400)
RBC: 5.48 MIL/uL — ABNORMAL HIGH (ref 3.87–5.11)
RDW: 16.9 % — ABNORMAL HIGH (ref 11.5–15.5)
WBC: 8 10*3/uL (ref 4.0–10.5)

## 2014-07-27 LAB — TROPONIN I: Troponin I: <0.3 ng/mL (ref <0.30)

## 2014-07-27 LAB — PROTIME-INR
INR: 1.04 (ref 0.00–1.49)
Prothrombin Time: 13.7 seconds (ref 11.6–15.2)

## 2014-07-27 LAB — BASIC METABOLIC PANEL
ANION GAP: 15 (ref 5–15)
BUN: 20 mg/dL (ref 6–23)
CO2: 22 meq/L (ref 19–32)
Calcium: 9.3 mg/dL (ref 8.4–10.5)
Chloride: 104 mEq/L (ref 96–112)
Creatinine, Ser: 1.3 mg/dL — ABNORMAL HIGH (ref 0.50–1.10)
GFR calc Af Amer: 55 mL/min — ABNORMAL LOW (ref >90)
GFR, EST NON AFRICAN AMERICAN: 48 mL/min — AB (ref >90)
GLUCOSE: 208 mg/dL — AB (ref 70–99)
POTASSIUM: 3.6 meq/L — AB (ref 3.7–5.3)
SODIUM: 141 meq/L (ref 137–147)

## 2014-07-27 LAB — PRO B NATRIURETIC PEPTIDE: Pro B Natriuretic peptide (BNP): 114.9 pg/mL (ref 0–125)

## 2014-07-27 LAB — D-DIMER, QUANTITATIVE: D-Dimer, Quant: 0.46 ug/mL-FEU (ref 0.00–0.48)

## 2014-07-27 MED ORDER — FUROSEMIDE 10 MG/ML IJ SOLN
INTRAMUSCULAR | Status: AC
  Filled 2014-07-27: qty 4

## 2014-07-27 MED ORDER — MORPHINE SULFATE 2 MG/ML IJ SOLN
2.0000 mg | Freq: Once | INTRAMUSCULAR | Status: AC
  Administered 2014-07-27: 2 mg via INTRAVENOUS
  Filled 2014-07-27: qty 1

## 2014-07-27 MED ORDER — FUROSEMIDE 10 MG/ML IJ SOLN
60.0000 mg | Freq: Once | INTRAMUSCULAR | Status: AC
  Administered 2014-07-27: 60 mg via INTRAVENOUS
  Filled 2014-07-27: qty 6

## 2014-07-27 MED ORDER — HYDROCODONE-ACETAMINOPHEN 5-325 MG PO TABS
1.0000 | ORAL_TABLET | ORAL | Status: DC | PRN
Start: 2014-07-27 — End: 2014-10-22

## 2014-07-27 NOTE — Discharge Instructions (Signed)
Edema Edema is an abnormal buildup of fluids. It is more common in your legs and thighs. Painless swelling of the feet and ankles is more likely as a person ages. It also is common in looser skin, like around your eyes. HOME CARE   Keep the affected body part above the level of the heart while lying down.  Do not sit still or stand for a long time.  Do not put anything right under your knees when you lie down.  Do not wear tight clothes on your upper legs.  Exercise your legs to help the puffiness (swelling) go down.  Wear elastic bandages or support stockings as told by your doctor.  A low-salt diet may help lessen the puffiness.  Only take medicine as told by your doctor. GET HELP IF:  Treatment is not working.  You have heart, liver, or kidney disease and notice that your skin looks puffy or shiny.  You have puffiness in your legs that does not get better when you raise your legs.  You have sudden weight gain for no reason. GET HELP RIGHT AWAY IF:   You have shortness of breath or chest pain.  You cannot breathe when you lie down.  You have pain, redness, or warmth in the areas that are puffy.  You have heart, liver, or kidney disease and get edema all of a sudden.  You have a fever and your symptoms get worse all of a sudden. MAKE SURE YOU:   Understand these instructions.  Will watch your condition.  Will get help right away if you are not doing well or get worse. Document Released: 03/02/2008 Document Revised: 09/19/2013 Document Reviewed: 07/07/2013 [facility] Patient Information 2015 [place], [place]. This information is not intended to replace advice given to you by your health care provider. Make sure you discuss any questions you have with your health care provider. Chronic Pain Chronic pain can be defined as pain that is off and on and lasts for 3-6 months or longer. Many things cause chronic pain, which can make it difficult to make a diagnosis. There are  many treatment options available for chronic pain. However, finding a treatment that works well for you may require trying various approaches until the right one is found. Many people benefit from a combination of two or more types of treatment to control their pain. SYMPTOMS  Chronic pain can occur anywhere in the body and can range from mild to very severe. Some types of chronic pain include:  Headache.  Low back pain.  Cancer pain.  Arthritis pain.  Neurogenic pain. This is pain resulting from damage to nerves. People with chronic pain may also have other symptoms such as:  Depression.  Anger.  Insomnia.  Anxiety. DIAGNOSIS  Your health care provider will help diagnose your condition over time. In many cases, the initial focus will be on excluding possible conditions that could be causing the pain. Depending on your symptoms, your health care provider may order tests to diagnose your condition. Some of these tests may include:   Blood tests.   CT scan.   MRI.   X-rays.   Ultrasounds.   Nerve conduction studies.  You may need to see a specialist.  TREATMENT  Finding treatment that works well may take time. You may be referred to a pain specialist. He or she may prescribe medicine or therapies, such as:   Mindful meditation or yoga.  Shots (injections) of numbing or pain-relieving medicines into the spine or  area of pain.  Local electrical stimulation.  Acupuncture.   Massage therapy.   Aroma, color, light, or sound therapy.   Biofeedback.   Working with a physical therapist to keep from getting stiff.   Regular, gentle exercise.   Cognitive or behavioral therapy.   Group support.  Sometimes, surgery may be recommended.  HOME CARE INSTRUCTIONS   Take all medicines as directed by your health care provider.   Lessen stress in your life by relaxing and doing things such as listening to calming music.   Exercise or be active as  directed by your health care provider.   Eat a healthy diet and include things such as vegetables, fruits, fish, and lean meats in your diet.   Keep all follow-up appointments with your health care provider.   Attend a support group with others suffering from chronic pain. SEEK MEDICAL CARE IF:   Your pain gets worse.   You develop a new pain that was not there before.   You cannot tolerate medicines given to you by your health care provider.   You have new symptoms since your last visit with your health care provider.  SEEK IMMEDIATE MEDICAL CARE IF:   You feel weak.   You have decreased sensation or numbness.   You lose control of bowel or bladder function.   Your pain suddenly gets much worse.   You develop shaking.  You develop chills.  You develop confusion.  You develop chest pain.  You develop shortness of breath.  MAKE SURE YOU:  Understand these instructions.  Will watch your condition.  Will get help right away if you are not doing well or get worse. Document Released: 06/06/2002 Document Revised: 05/17/2013 Document Reviewed: 03/10/2013 [facility] Patient Information 2015 [place], [place]. This information is not intended to replace advice given to you by your health care provider. Make sure you discuss any questions you have with your health care provider.

## 2014-07-27 NOTE — ED Notes (Addendum)
Pt reports BLE leg swelling and pain for last several days. Pt reports "a knot" to LLE since yesteday. Pt also reports chest tightness this am. Pt denies and cp,sob in triage. Pt alert and oriented. nad noted.

## 2014-07-27 NOTE — ED Provider Notes (Signed)
CSN: [id number]     Arrival date & time 07/27/14  1111 History  This chart was scribed for Summer Pollack, MD by Steva Colder, ED Scribe. The patient was seen in room [facility] at 1:24 PM.     Chief Complaint  Patient presents with  . Leg Swelling    The history is provided by the patient. No language interpreter was used.   HPI Comments: Summer Cook is a 48 y.o. female with a hx of PE who presents to the Emergency Department complaining of bilateral lower extremity leg swelling and pain onset several days. She states that this is something that has happened before. She states that last time this happened she had a blood clot in her lung that they think traveled from her leg 3 years ago. She states that she was put on a blood thinner for 6-9 months. She states that the last blood clot came out of nowhere. She states that that time her legs did swell. She states that when she tries to lift them they feel like lead.    She states that she is having associated symptoms of chest tightness, SOB, CP, heartburn, right ear pain. She states that she ate a bowl of cereal PTA. She states that her CP is not like pain that she has had before. She states that her CP is in the middle and is stabbing. She states that she has been putting sweet oil in her ear with no relief for her ear pain symptoms. She states that her SOB is worse when she lays down. She states that her leg swelling is worse at night. She states that she has had to sleep in a recliner for the past couple nights.   She denies fever, cough, any other symptoms. She denies smoking. 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 She states that she takes Insulin PRN. She states that she has seen a Film/video editor before. She states that she had a myocardial effusion done this year. She states that she quit smoking in 2011. She states that sh e has been out of her Lasix  for 2 days and is getting a refill tomorrow.   Past Medical History  Diagnosis Date  . Essential hypertension, benign   . 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  Last was Prednisone $RemoveBefore'40mg'viyHzOkcEdaEM$ ; 2 wk in 10/2012.  She also was on Prednisone bridged with Cellcept briefly but stopped due to lack of insurance.   . Depression   . Thrush, oral 05/29/2011  . Steroid-induced diabetes   . Pulmonary embolism 04/2012  . Asthma   . Renal insufficiency   . Chronic chest pain   . Normal cardiac stress test 02/2014   Past Surgical History  Procedure Laterality Date  . Cholecystectomy  2008  . Bone marrow biopsy    . 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 Colonoscopy with esophagogastroduodenoscopy (egd) N/A 01/04/2013    Dr. Gala Romney: esophageal plaques with +KOH, hh. Gastric antrum abnormal, bx reactive gastropathy. Anal canal hemorrhoids, colonic tics, normal TI, single polyp (sessile serrated tubular adenoma). Next TCS 12/2017  . Cataract extraction     Family History  Problem Relation Age of Onset  . Cervical cancer Mother   . Lung cancer Father   . Pneumonia Brother   . Cancer Paternal Grandmother  88    colon cancer, breast cancer  . Cancer Paternal Grandfather 89    colon cancer  . AAA (abdominal aortic aneurysm) Paternal Grandmother    History  Substance Use Topics  . Smoking status: Former Smoker -- 0 years    Types: Cigarettes    Quit date: 02/14/2009  . Smokeless tobacco: Never Used  . Alcohol Use: No   OB History   Grav Para Term Preterm Abortions TAB SAB Ect Mult Living                 Review of Systems  Constitutional: Negative for fever.  HENT: Positive for ear pain.   Respiratory: Positive for chest tightness and shortness of breath. Negative for cough.   Cardiovascular: Positive for leg swelling (BLE ).  All other systems reviewed and are negative.     Allergies  Yellow  jacket venom  Home Medications   Prior to Admission medications   Medication Sig Start Date End Date Taking? Authorizing Provider  albuterol (PROVENTIL HFA;VENTOLIN HFA) 108 (90 BASE) MCG/ACT inhaler Inhale 2 puffs into the lungs every 6 (six) hours as needed for wheezing or shortness of breath.    Yes Historical Provider, MD  ALPRAZolam Duanne Moron) 1 MG tablet Take 1 mg by mouth 3 (three) times daily as needed for anxiety.    Yes Historical Provider, MD  dexamethasone (DECADRON) 0.5 MG tablet Take 0.5 mg by mouth 2 (two) times daily.    Yes Historical Provider, MD  furosemide (LASIX) 20 MG tablet Take 1 tablet (20 mg total) by mouth daily. Restart in 3 days 04/29/14  Yes Kathie Dike, MD  HYDROcodone-acetaminophen (NORCO) 10-325 MG per tablet Take 1 tablet by mouth every 6 (six) hours as needed. 05/07/14  Yes Historical Provider, MD  insulin aspart (NOVOLOG) 100 UNIT/ML injection Inject 2-5 Units into the skin daily as needed for high blood sugar. Per sliding scale.   Yes Historical Provider, MD  ipratropium-albuterol (DUONEB) 0.5-2.5 (3) MG/3ML SOLN Take 3 mLs by nebulization 3 (three) times daily as needed (shorntess of breath/wheezing).  03/22/13  Yes Historical Provider, MD  metoprolol (LOPRESSOR) 50 MG tablet Take 50 mg by mouth 2 (two) times daily. 06/23/14  Yes Historical Provider, MD  mometasone (NASONEX) 50 MCG/ACT nasal spray Place 2 sprays into the nose daily.   Yes Historical Provider, MD  montelukast (SINGULAIR) 10 MG tablet Take 10 mg by mouth daily.   Yes Historical Provider, MD  Multiple Vitamin (MULTIVITAMIN WITH MINERALS) TABS tablet Take 1 tablet by mouth daily.   Yes Historical Provider, MD  potassium chloride SA (K-DUR,KLOR-CON) 20 MEQ tablet Take 1 tablet (20 mEq total) by mouth daily. 04/29/14  Yes Kathie Dike, MD  sertraline (ZOLOFT) 50 MG tablet Take 150 mg by mouth daily.    Yes Historical Provider, MD  vitamin B-12 (CYANOCOBALAMIN) 1000 MCG tablet Take 1,000 mcg by mouth  daily.   Yes Historical Provider, MD  calcitonin, salmon, (MIACALCIN/FORTICAL) 200 UNIT/ACT nasal spray Place 1 spray into alternate nostrils daily.    Historical Provider, MD  ondansetron (ZOFRAN) 4 MG tablet Take 1 tablet (4 mg total) by mouth every 6 (six) hours. 05/01/14   Ezequiel Essex, MD   BP 123/78  Pulse 82  Temp(Src) 98.6 F (37 C) (Oral)  Resp 18  Ht $R'5\' 4"'xL$  (1.626 m)  Wt 219 lb (99.338 kg)  BMI 37.57 kg/m2  SpO2 100%  LMP 05/13/2011  Physical Exam  Nursing note and vitals reviewed. Constitutional: She is oriented  to person, place, and time. She appears well-developed and well-nourished.  HENT:  Head: Normocephalic and atraumatic.  Left Ear: External ear normal.  Eyes: Pupils are equal, round, and reactive to light.  Neck: Normal range of motion.  Cardiovascular: Normal rate, regular rhythm and normal heart sounds.   Pulmonary/Chest: Effort normal and breath sounds normal. She has no wheezes. She has no rales.  Musculoskeletal: She exhibits edema and tenderness.  Tender to palpation. Bilateral lower extremity swelling. Mild pitting edema noted  Neurological: She is alert and oriented to person, place, and time.    ED Course  Procedures (including critical care time) DIAGNOSTIC STUDIES: Oxygen Saturation is 100% on room air, normal by my interpretation.    COORDINATION OF CARE: 1:34 PM-Discussed treatment plan which includes CBC and lasix injection with pt at bedside and pt agreed to plan.   Labs Review Labs Reviewed  CBC WITH DIFFERENTIAL - Abnormal; Notable for the following:    RBC 5.48 (*)    HCT 46.3 (*)    RDW 16.9 (*)    Platelets 132 (*)    All other components within normal limits  BASIC METABOLIC PANEL - Abnormal; Notable for the following:    Potassium 3.6 (*)    Glucose, Bld 208 (*)    Creatinine, Ser 1.30 (*)    GFR calc non Af Amer 48 (*)    GFR calc Af Amer 55 (*)    All other components within normal limits    Imaging Review Dg Chest 2  View  07/27/2014   CLINICAL DATA:  Shortness of breath.  Asthma.  Tachycardia.  EXAM: CHEST  2 VIEW  COMPARISON:  04/28/2014  FINDINGS: The heart size and mediastinal contours are within normal limits. Both lungs are clear. The visualized skeletal structures are unremarkable.  IMPRESSION: No active cardiopulmonary disease.   Electronically Signed   By: Earle Gell M.D.   On: 07/27/2014 15:56   US Venous Img Lower Bilateral  07/27/2014   CLINICAL DATA:  Bilateral lower extremity swelling and pain for 1 week.  EXAM: BILATERAL LOWER EXTREMITY VENOUS DOPPLER ULTRASOUND  TECHNIQUE: Gray-scale sonography with graded compression, as well as color Doppler and duplex ultrasound were performed to evaluate the lower extremity deep venous systems from the level of the common femoral vein and including the common femoral, femoral, profunda femoral, popliteal and calf veins including the posterior tibial, peroneal and gastrocnemius veins when visible. The superficial great saphenous vein was also interrogated. Spectral Doppler was utilized to evaluate flow at rest and with distal augmentation maneuvers in the common femoral, femoral and popliteal veins.  COMPARISON:  None.  FINDINGS: RIGHT LOWER EXTREMITY  Common Femoral Vein: No evidence of thrombus. Normal compressibility, respiratory phasicity and response to augmentation.  Saphenofemoral Junction: No evidence of thrombus. Normal compressibility and flow on color Doppler imaging.  Profunda Femoral Vein: No evidence of thrombus. Normal compressibility and flow on color Doppler imaging.  Femoral Vein: No evidence of thrombus. Normal compressibility, respiratory phasicity and response to augmentation.  Popliteal Vein: No evidence of thrombus. Normal compressibility, respiratory phasicity and response to augmentation.  Calf Veins: No evidence of thrombus. Normal compressibility and flow on color Doppler imaging.  Superficial Great Saphenous Vein: No evidence of thrombus.  Normal compressibility and flow on color Doppler imaging.  Venous Reflux:  None.  Other Findings:  None.  LEFT LOWER EXTREMITY  Common Femoral Vein: No evidence of thrombus. Normal compressibility, respiratory phasicity and response to augmentation.  Saphenofemoral Junction: No  evidence of thrombus. Normal compressibility and flow on color Doppler imaging.  Profunda Femoral Vein: No evidence of thrombus. Normal compressibility and flow on color Doppler imaging.  Femoral Vein: No evidence of thrombus. Normal compressibility, respiratory phasicity and response to augmentation.  Popliteal Vein: No evidence of thrombus. Normal compressibility, respiratory phasicity and response to augmentation.  Calf Veins: No evidence of thrombus. Normal compressibility and flow on color Doppler imaging.  Superficial Great Saphenous Vein: No evidence of thrombus. Normal compressibility and flow on color Doppler imaging.  Venous Reflux:  None.  Other Findings:  None.  IMPRESSION: No evidence of deep venous thrombosis.   Electronically Signed   By: Daryll Brod M.D.   On: 07/27/2014 16:38     MDM   Final diagnoses:  Swelling  Shortness of breath  Chronic pain  Has run out of medications    5:26 PM Reviewing labs with patient and states she is scheduled to see Rowan Blase at Dr. Nolon Rod office Monday as she has been out of hydrocodone for 4 days.  She has been taking the maximum amount.  Now states she thinks the pain is worse as out of narcotics and this may also have led to sob.  She states she cannot have meds refilled until seen and [name] not in office.  I have discussed narcotics use and that given she is likely having symptoms due to this.  I will give her enough to stave off withdrawal through weekend but I have discussed that she cannot rely on ED to fill refill narcotics and she voices understanding.    I personally performed the services described in this documentation, which was scribed in my presence. The  recorded information has been reviewed and is accurate.   Diagnosis of shortness of breath maintained through medical record although patient denies now.   1-Chronic pain- out of medications- patient  2-chronic renal insufficiency- patient to follow up with pmd- appears stable 3- peripheral edema  I personally performed the services described in this documentation, which was scribed in my presence. The recorded information has been reviewed and considered.   Summer Pollack, MD 07/27/14 [phone]

## 2014-07-30 DIAGNOSIS — E6609 Other obesity due to excess calories: Secondary | ICD-10-CM | POA: Diagnosis not present

## 2014-07-30 DIAGNOSIS — Z6835 Body mass index (BMI) 35.0-35.9, adult: Secondary | ICD-10-CM | POA: Diagnosis not present

## 2014-07-30 DIAGNOSIS — R600 Localized edema: Secondary | ICD-10-CM | POA: Diagnosis not present

## 2014-07-30 DIAGNOSIS — G894 Chronic pain syndrome: Secondary | ICD-10-CM | POA: Diagnosis not present

## 2014-07-31 IMAGING — CR DG CHEST 2V
2 series · 2 of 2 positions shown · non-contrast
Comparison: None.

CLINICAL DATA: Chest pain

CHEST - 2 VIEW

[view not recorded (1 of 2)]
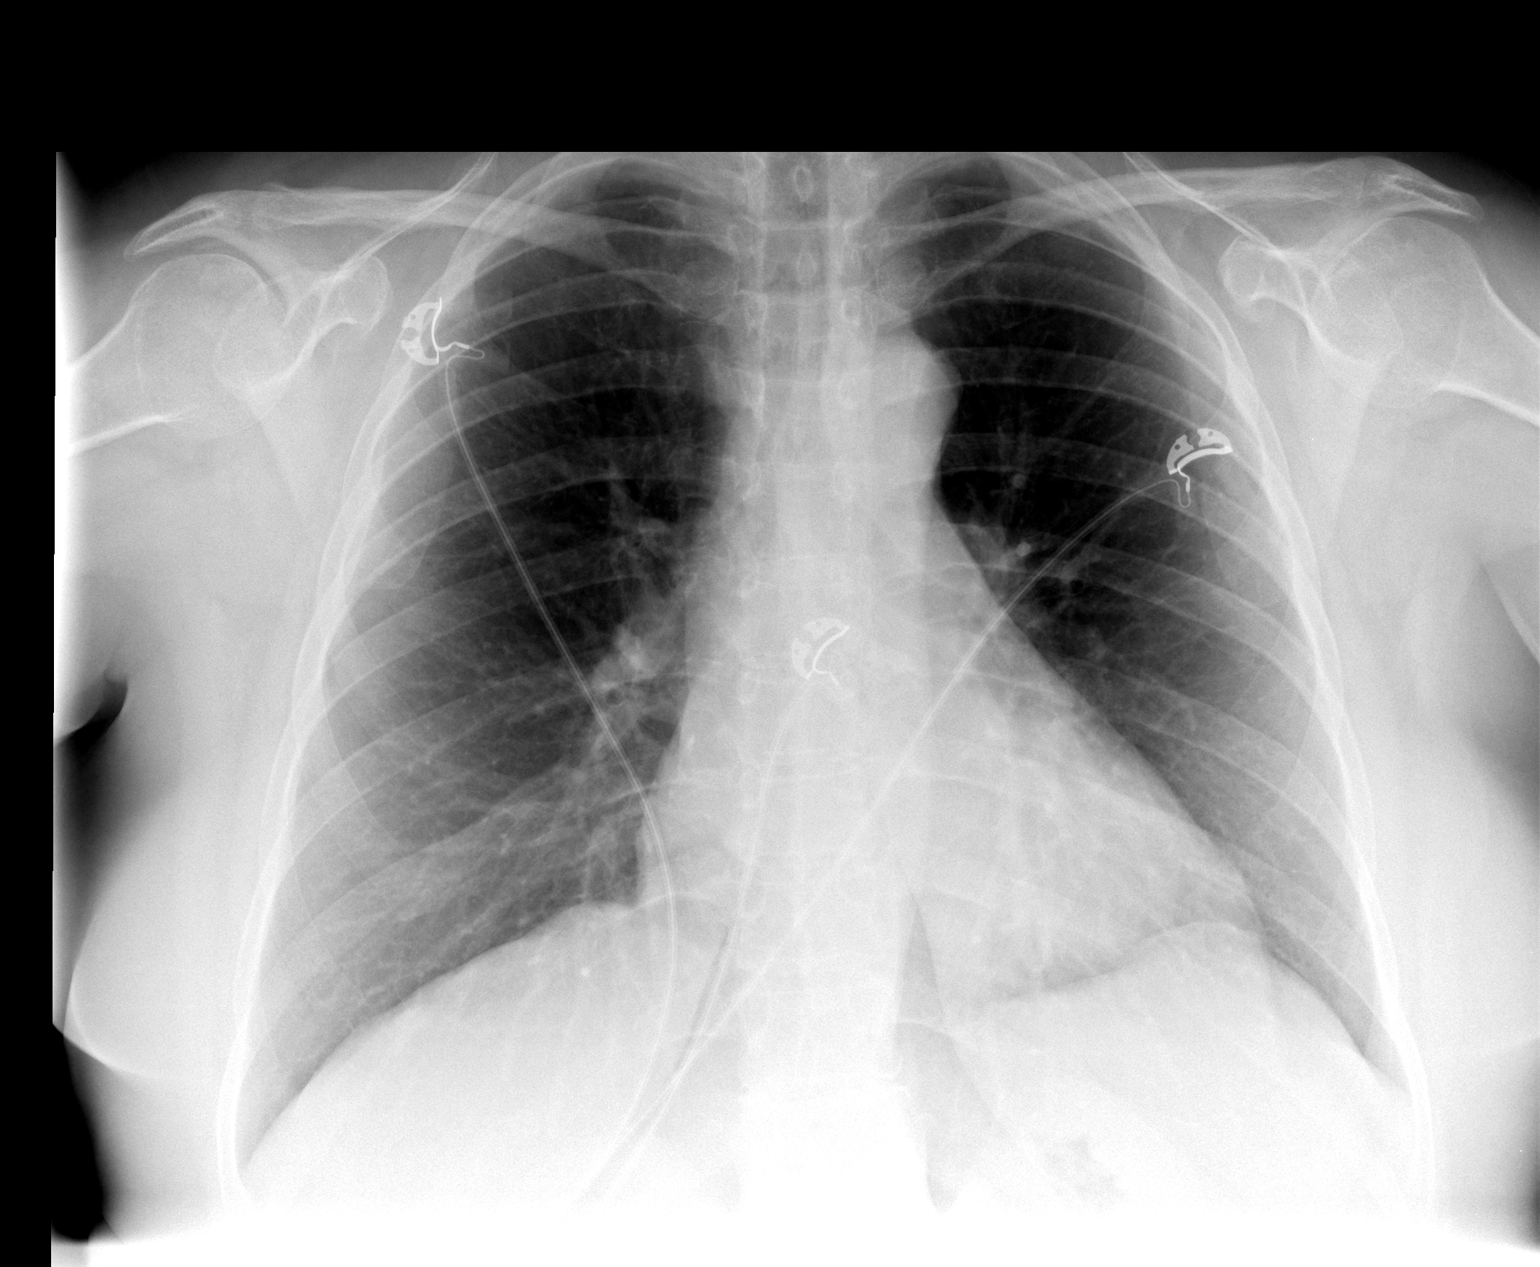

[view not recorded (2 of 2)]
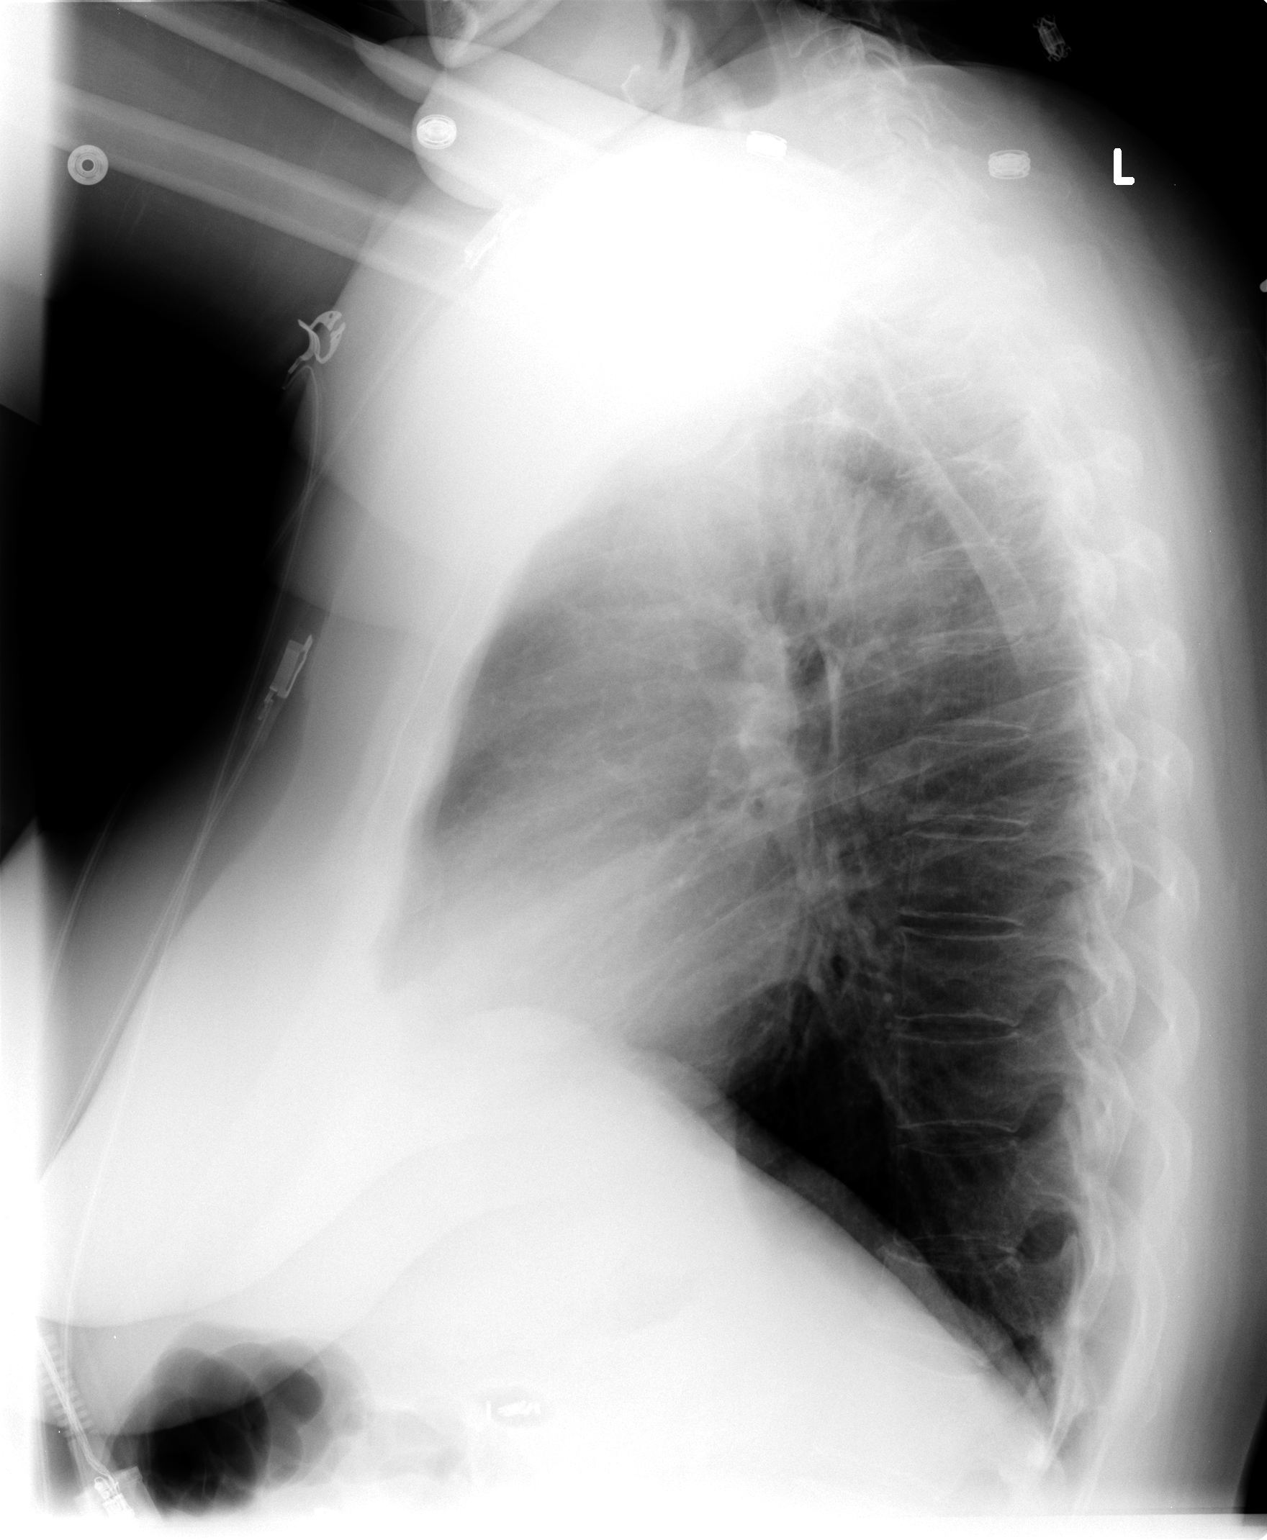

[2 of 2 positions shown; findings below may reference images not displayed]

FINDINGS: The heart and pulmonary vascularity are within normal
limits. The lungs are free of acute infiltrate or sizable effusion.
No acute bony abnormality is noted.
IMPRESSION: No acute abnormalities seen.

## 2014-08-06 IMAGING — CR DG CHEST 2V
2 series · 2 of 2 positions shown · non-contrast
Comparison: 02/22/2013

CLINICAL DATA: Chest pain

CHEST - 2 VIEW

[w chest lat]
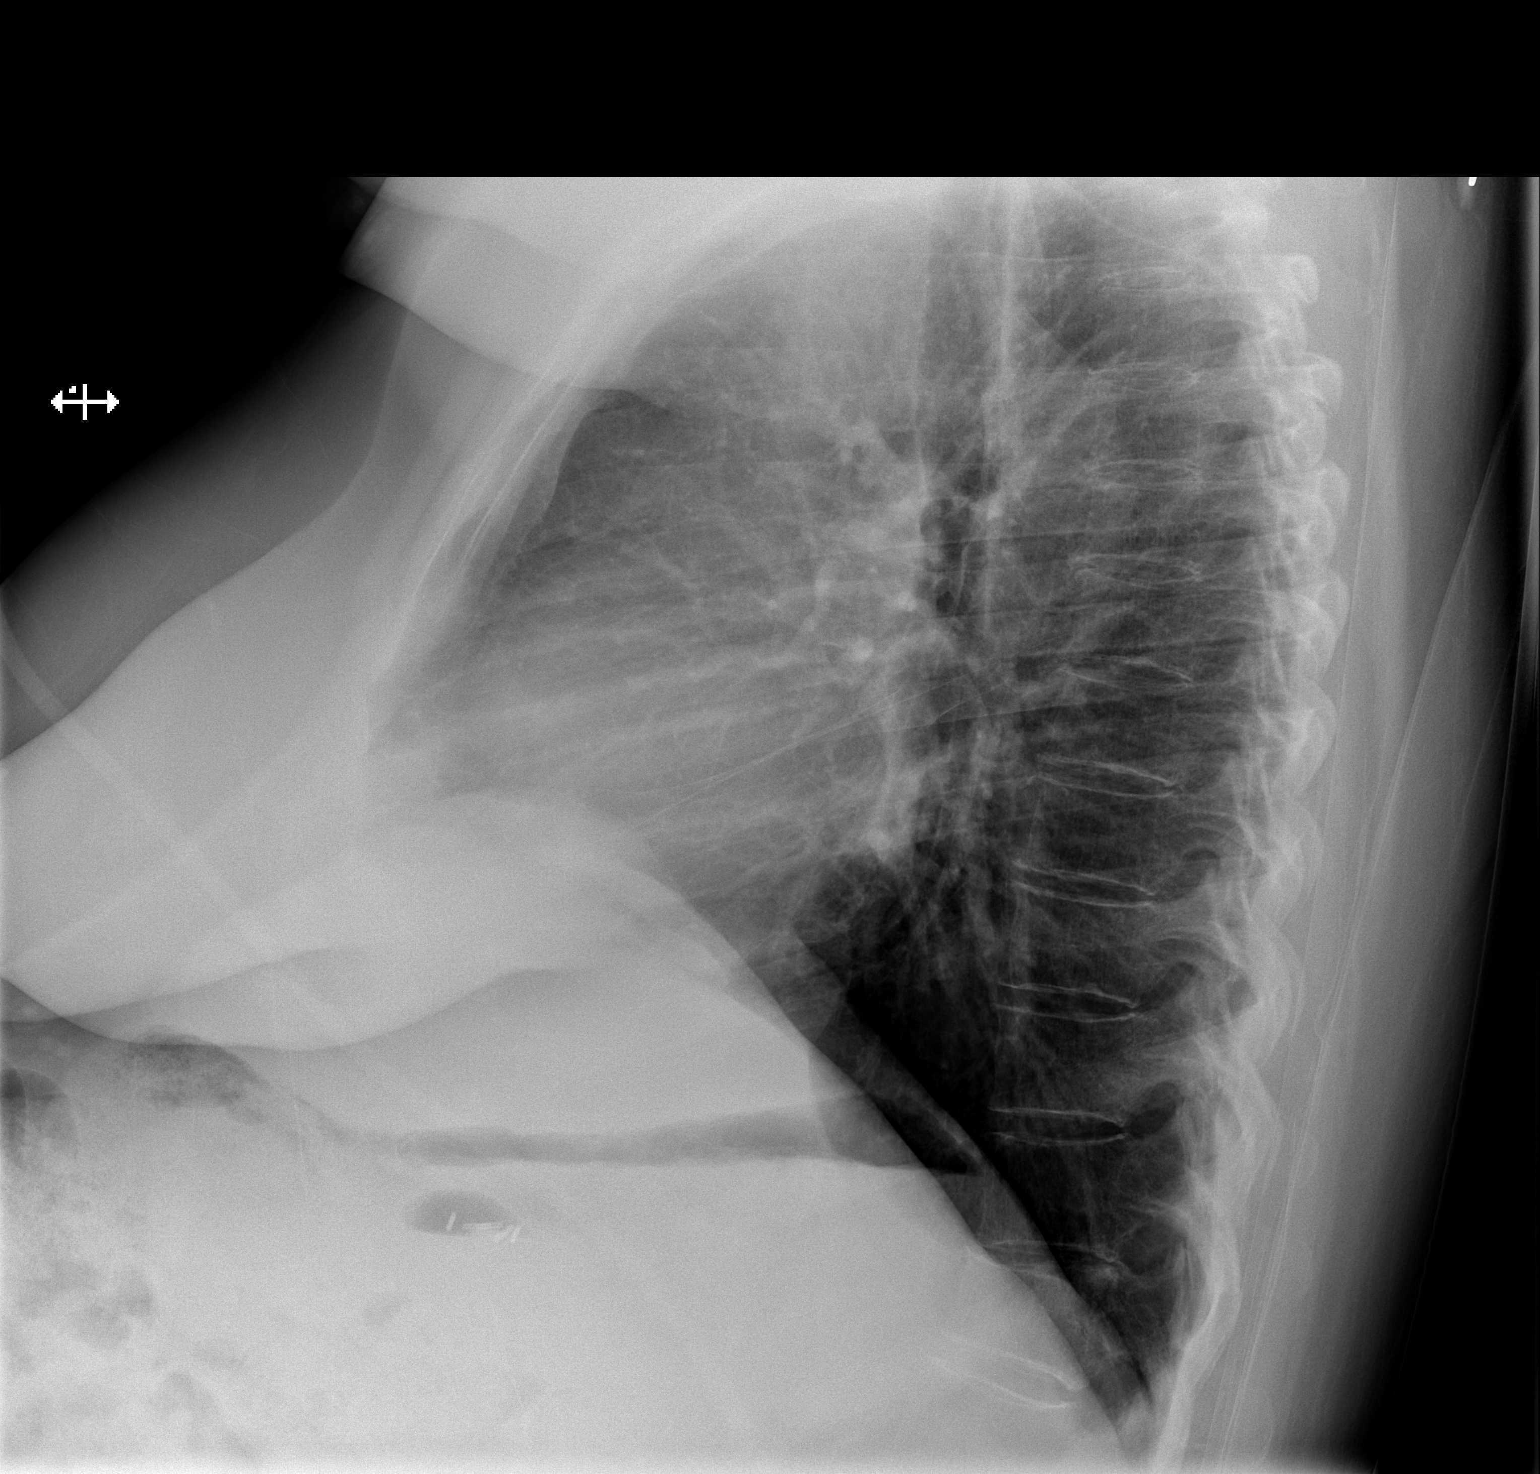

[x chest ap]
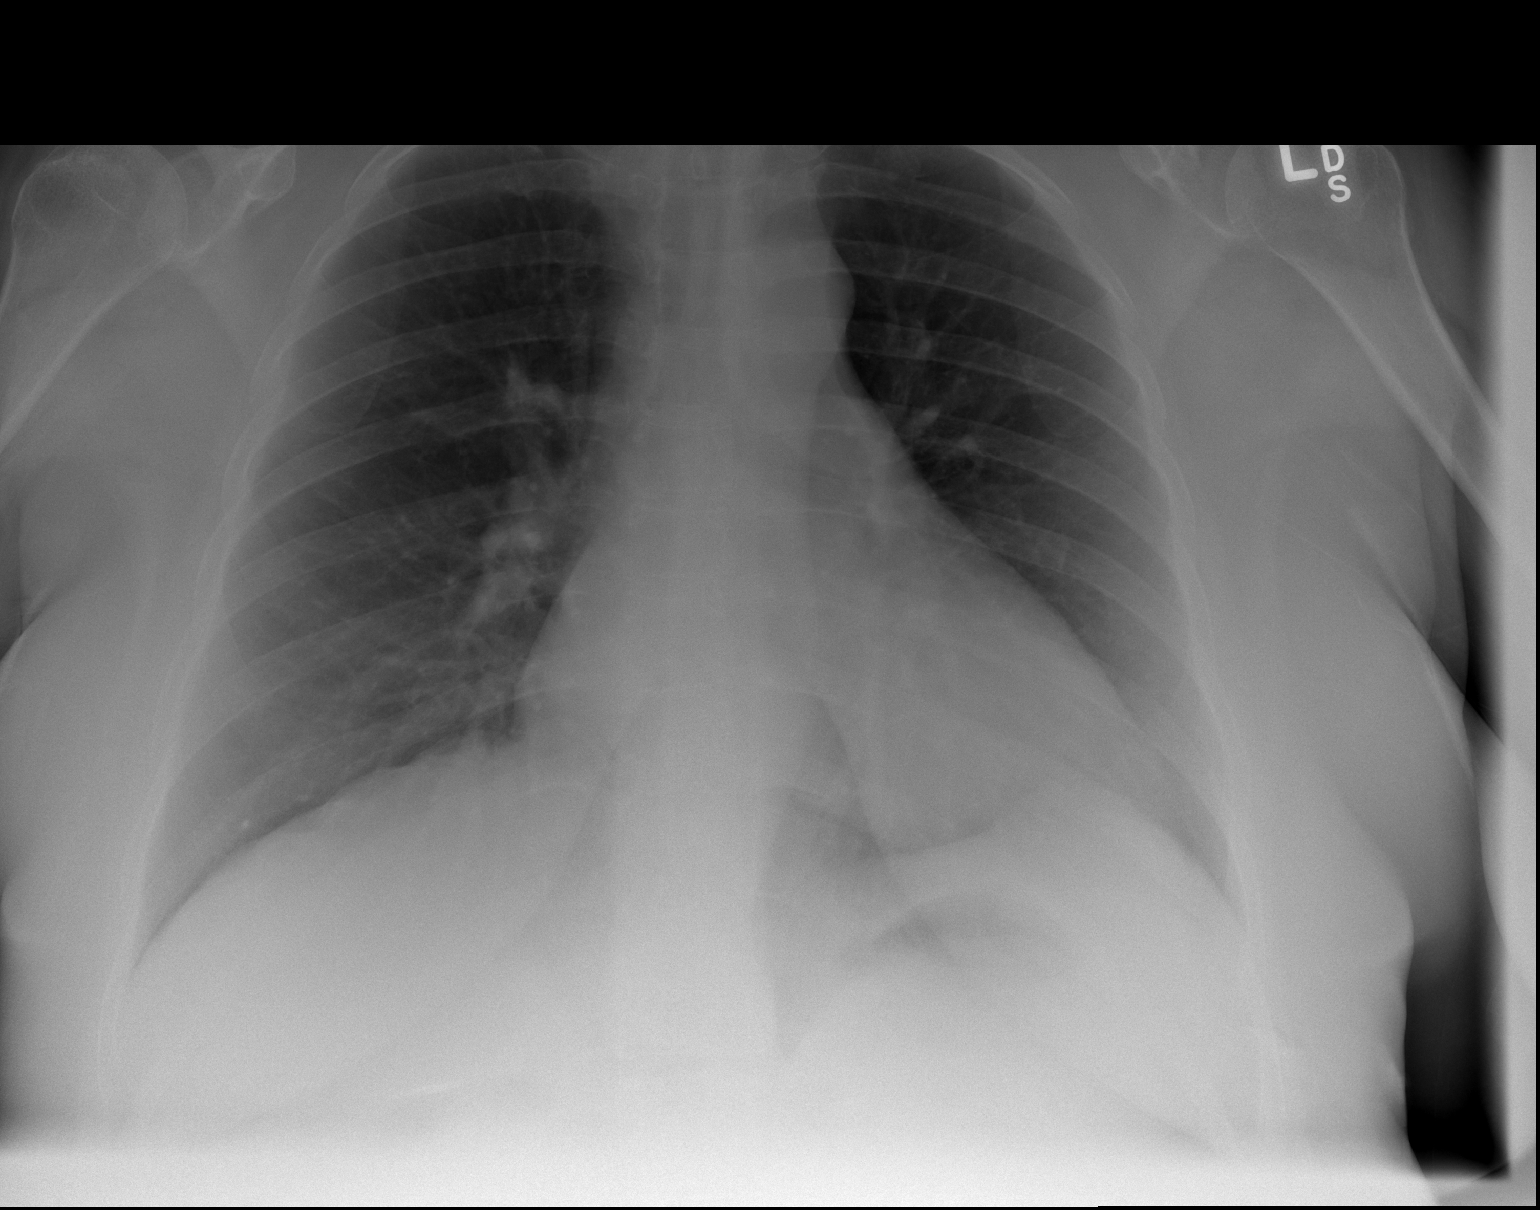

[2 of 2 positions shown; findings below may reference images not displayed]

FINDINGS: The heart and pulmonary vascularity are within normal
limits. The lungs are clear bilaterally.  No acute bony abnormality
is seen.  An old compression deformity is noted in the lower
thoracic spine.
IMPRESSION: No acute abnormality noted.

## 2014-08-10 IMAGING — CR DG CHEST 1V PORT
1 series · 1 of 1 positions shown · non-contrast
Comparison: Prior chest x-ray 02/28/2013

CLINICAL DATA: Short of breath, fall

PORTABLE CHEST - 1 VIEW

[view not recorded]
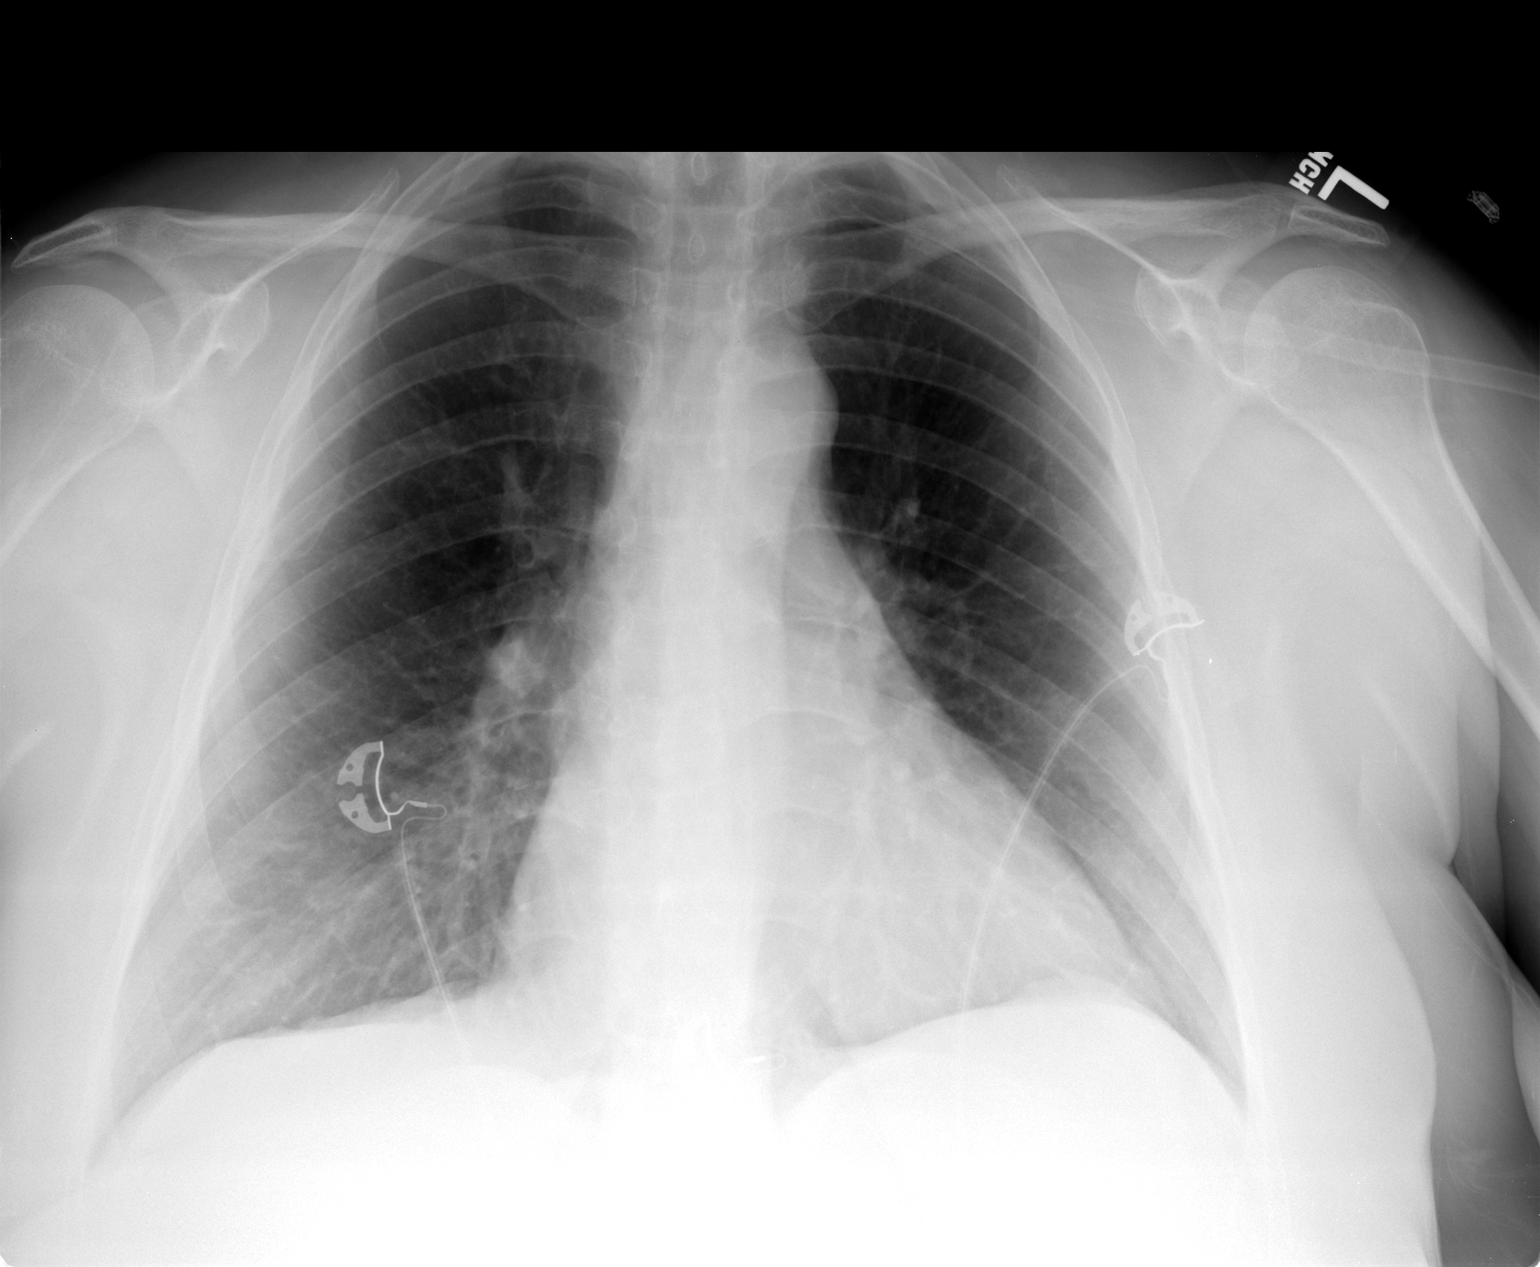

[1 of 1 positions shown; findings below may reference images not displayed]

FINDINGS: Mild prominence of the bibasilar interstitial markings
similar to prior.  Unchanged cardiac and mediastinal contours.  No
pneumothorax, pleural effusion or focal airspace consolidation.  No
pulmonary edema.  No acute osseous abnormality.
IMPRESSION: No acute cardiopulmonary disease.

## 2014-08-29 DIAGNOSIS — E6609 Other obesity due to excess calories: Secondary | ICD-10-CM | POA: Diagnosis not present

## 2014-08-29 DIAGNOSIS — Z6836 Body mass index (BMI) 36.0-36.9, adult: Secondary | ICD-10-CM | POA: Diagnosis not present

## 2014-08-29 DIAGNOSIS — F3341 Major depressive disorder, recurrent, in partial remission: Secondary | ICD-10-CM | POA: Diagnosis not present

## 2014-09-11 IMAGING — CT CT ANGIO CHEST
1 of 6 series · 5 of 36 positions shown · IV contrast (Omnipaque 300)
Comparison: Chest x-ray 03/04/2013, most recent CT PE study
02/03/2011

CLINICAL DATA: Weak, dizzy, tachycardia history of PE

CT ANGIOGRAPHY CHEST
TECHNIQUE: Multidetector CT imaging of the chest using the
standard protocol during bolus administration of intravenous
contrast. Multiplanar reconstructed images including MIPs were
obtained and reviewed to evaluate the vascular anatomy.
Contrast: 100mL OMNIPAQUE IOHEXOL 350 MG/ML SOLN

[Series 4: pe 3.0 b40f · axial · 0.67mm/px · z∈[-243,-96]mm · 5 of 75 slices shown]
[im 13/75  lung]
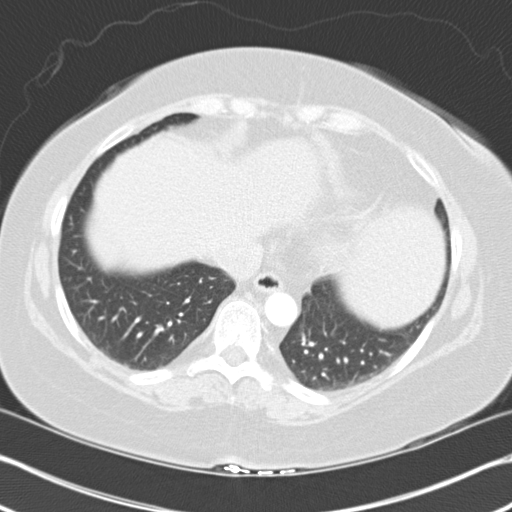
[im 25/75  mediastinal]
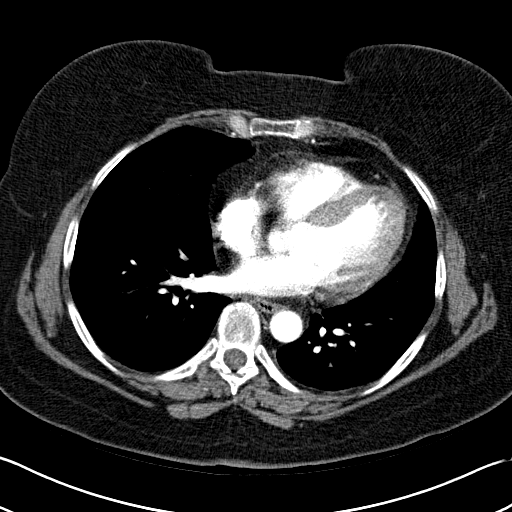
[im 38/75  lung]
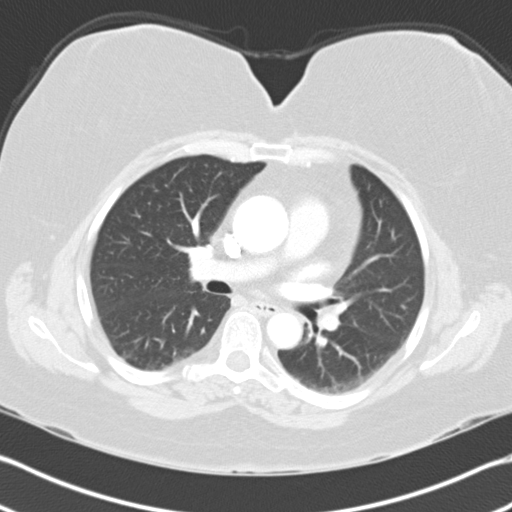
[im 50/75  mediastinal]
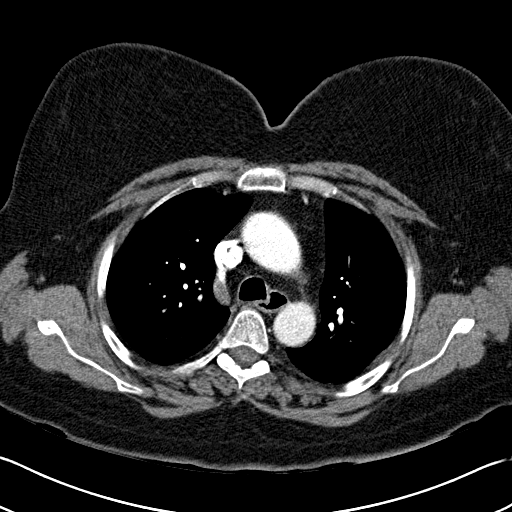
[im 62/75  lung]
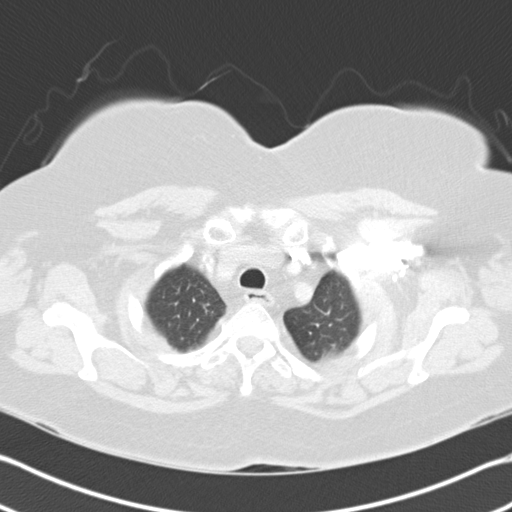

[5 of 36 positions shown; findings below may reference images not displayed]

FINDINGS: Mediastinum: Unremarkable CT appearance of the thyroid gland.  No
suspicious mediastinal or hilar adenopathy.  No soft tissue
mediastinal mass.  The thoracic esophagus is unremarkable.

Heart/Vascular: Relatively poor opacification of the pulmonary
arteries secondary to transient physiologic interruption of
contrast.  There is no main or central pulmonary embolus.
Evaluation of the more distal arterial tree is nondiagnostic.  The
heart is within normal limits for size.  No pericardial effusion.
Evaluation for coronary artery calcification is limited by non
gated technique.  Mild ectasia of the ascending aorta to 3.7 cm.
Query some mild thickening of the aortic valve.  Again, evaluation
is limited by non gated cardiac technique.  Conventional three-
vessel arch anatomy.

Lungs/Pleura: Trace dependent atelectasis in the bilateral lower
lobes.  Negative for focal airspace consolidation, edema, pleural
effusion or pneumothorax.

Upper Abdomen:   The visualized upper abdomen is unremarkable.
Incidentally in the lateral segmental branch of the left hepatic
artery is replaced to the left gastric artery. Focal attenuation
differences in the left upper quadrant abdominal fat likely
postsurgical from prior splenectomy.  It is unchanged compared to
prior.

Bones: No acute fracture or aggressive appearing lytic or blastic
osseous lesion.
IMPRESSION: 1.  Limited evaluation for pulmonary embolus secondary to
transition physiologic interruption of contrast.  There is no large
PE in the main or central pulmonary arteries.  Evaluation for more
distal emboli is nondiagnostic.

2.  Negative for pneumonia or other acute pulmonary process.

3.  Ectasia of the ascending aorta with a suggestion of thickening
of the aortic valve.  If there is clinical concern for aortic valve
abnormality - this could be further evaluated with
echocardiography.

## 2014-09-11 IMAGING — CT CT HEAD W/O CM
1 series · 16 of 30 positions shown, 20 images · non-contrast
Comparison: MRI brain 02/28/2013.CT head 05/02/2012.

CLINICAL DATA: 1.5 weeks of positional headache.

CT HEAD WITHOUT CONTRAST
TECHNIQUE: Contiguous axial images were obtained from the base of
the skull through the vertex without contrast

[Series 2: headseq 4.8 h37s · axial · 0.43mm/px · z∈[+91,+262]mm · 16 of 36 slices shown, 20 images]
[im 2/36  brain]
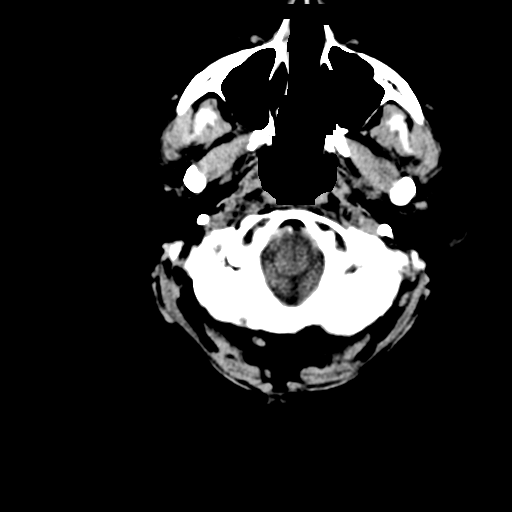
[im 2/36  bone]
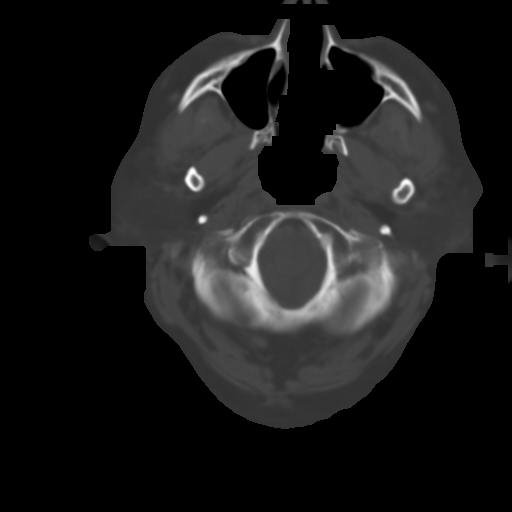
[im 4/36  brain]
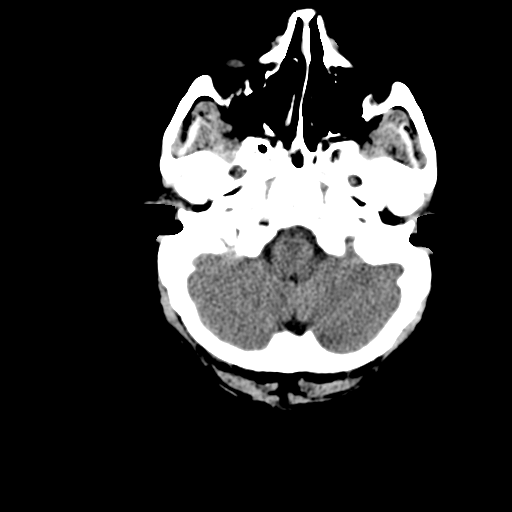
[im 7/36  brain]
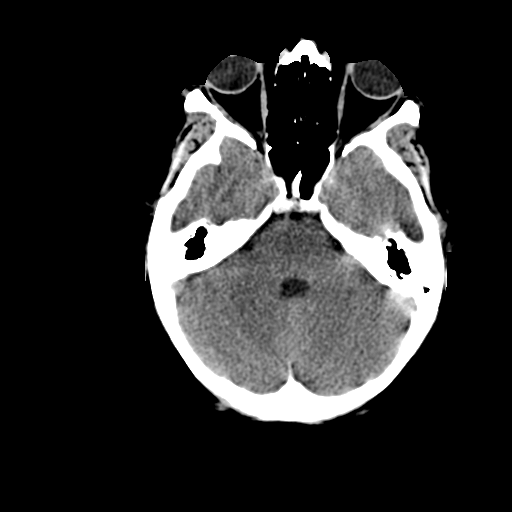
[im 9/36  brain]
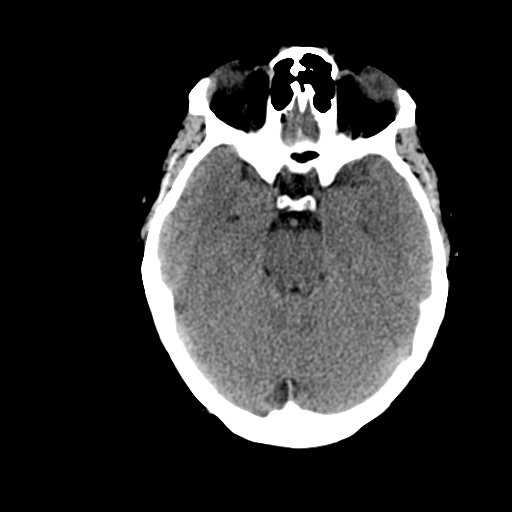
[im 10/36  brain]
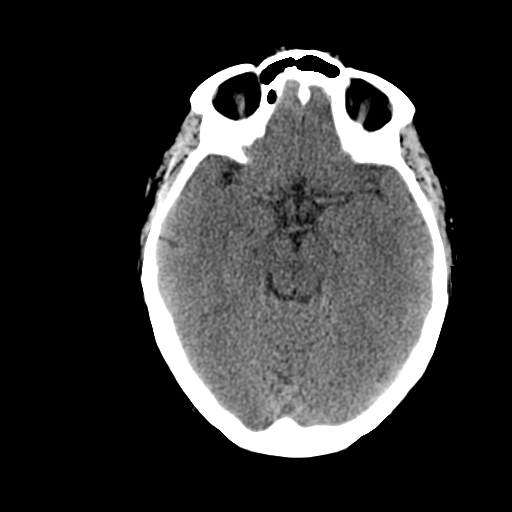
[im 10/36  bone]
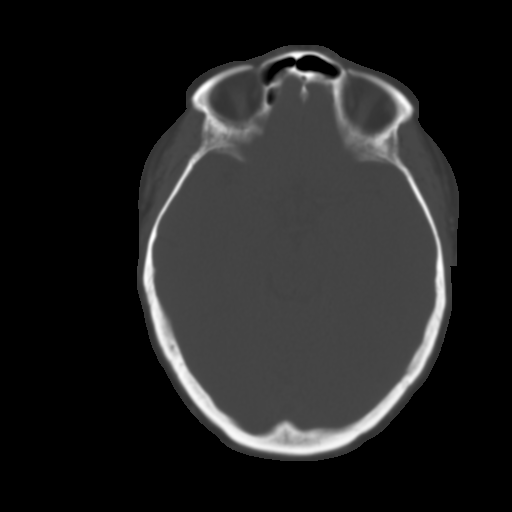
[im 13/36  brain]
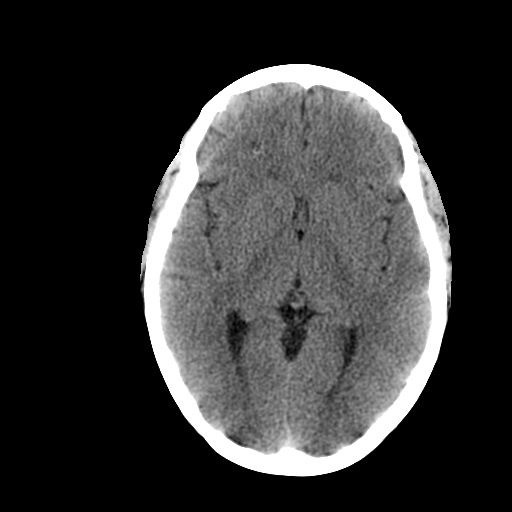
[im 15/36  brain]
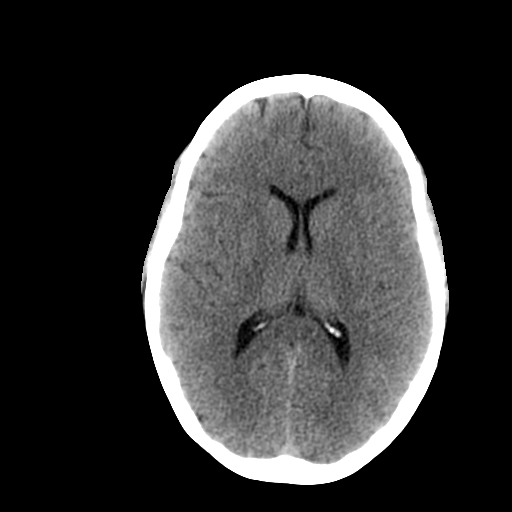
[im 17/36  brain]
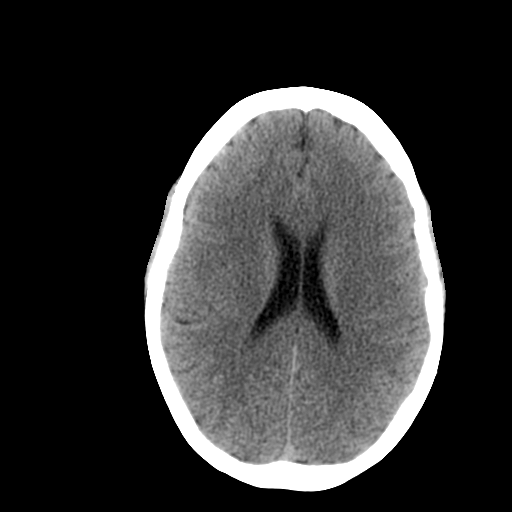
[im 19/36  brain]
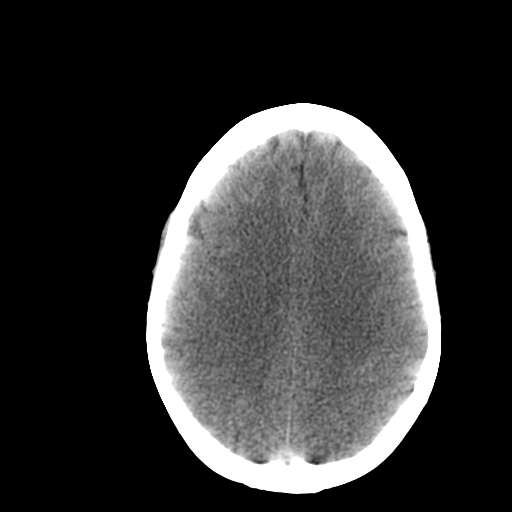
[im 19/36  bone]
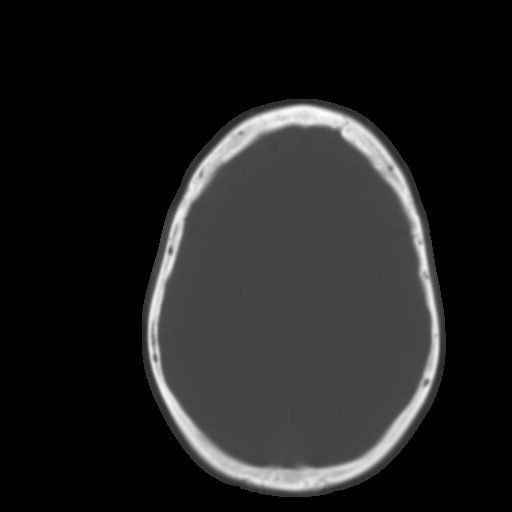
[im 21/36  brain]
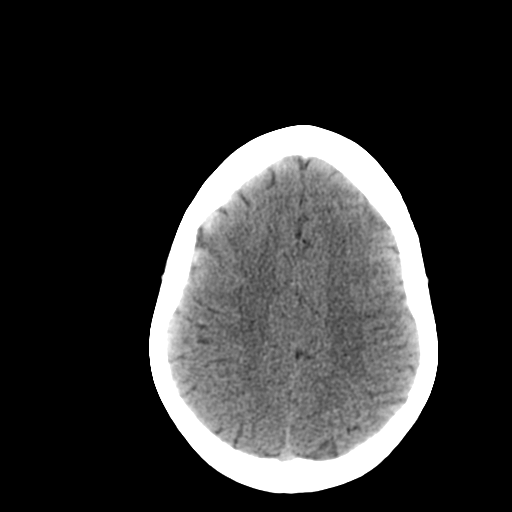
[im 23/36  brain]
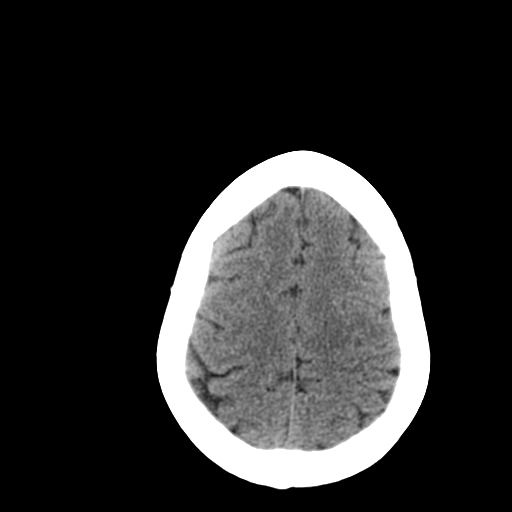
[im 26/36  brain]
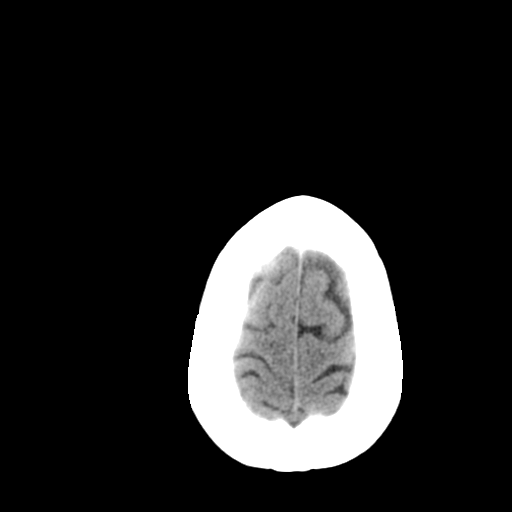
[im 27/36  brain]
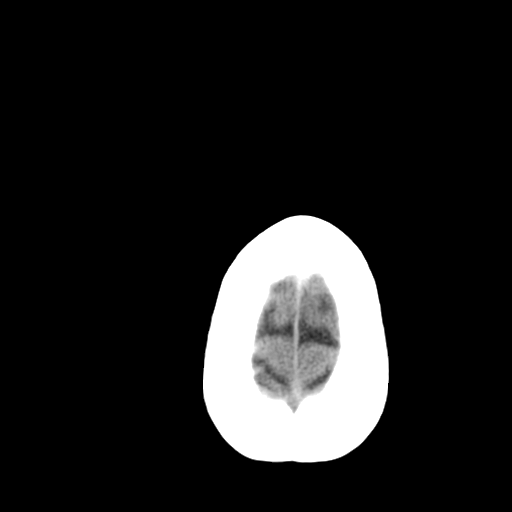
[im 27/36  bone]
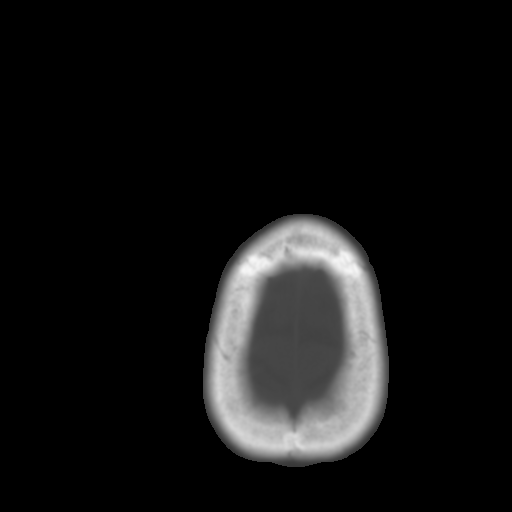
[im 29/36  brain]
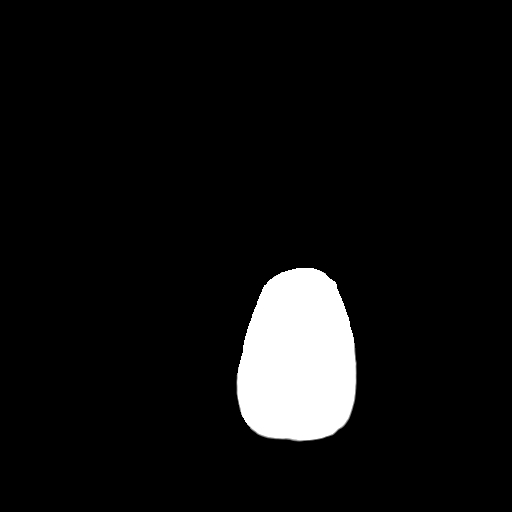
[im 32/36  brain]
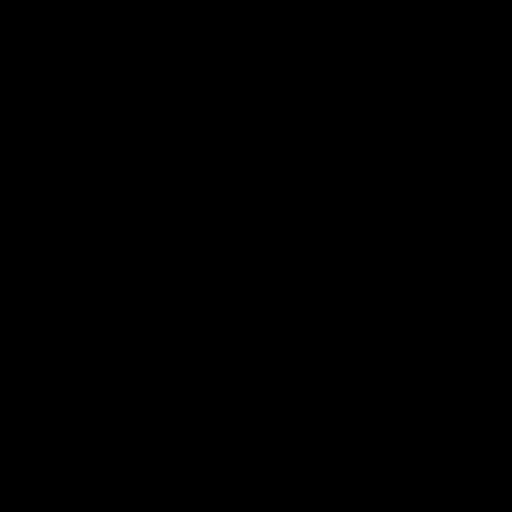
[im 34/36  brain]
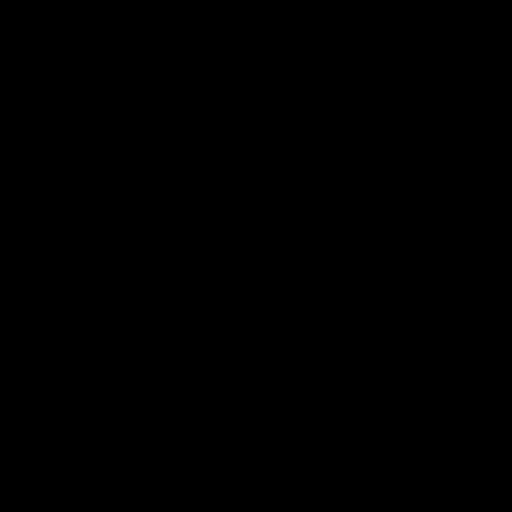

[16 of 30 positions shown; findings below may reference images not displayed]

FINDINGS: The brain has a normal appearance without evidence for
hemorrhage, acute infarction, hydrocephalus, or mass lesion.  There
is no extra axial fluid collection.  The skull and paranasal
sinuses are normal. Mild tonsillar ectopia is stable.  No change
from priors.
IMPRESSION: Normal CT of the head without contrast.

## 2014-09-27 DIAGNOSIS — G894 Chronic pain syndrome: Secondary | ICD-10-CM | POA: Diagnosis not present

## 2014-09-27 DIAGNOSIS — M479 Spondylosis, unspecified: Secondary | ICD-10-CM | POA: Diagnosis not present

## 2014-09-27 DIAGNOSIS — J9801 Acute bronchospasm: Secondary | ICD-10-CM | POA: Diagnosis not present

## 2014-09-27 DIAGNOSIS — M4806 Spinal stenosis, lumbar region: Secondary | ICD-10-CM | POA: Diagnosis not present

## 2014-10-18 ENCOUNTER — Other Ambulatory Visit (HOSPITAL_COMMUNITY): Payer: Self-pay

## 2014-10-18 DIAGNOSIS — D509 Iron deficiency anemia, unspecified: Secondary | ICD-10-CM

## 2014-10-18 DIAGNOSIS — D693 Immune thrombocytopenic purpura: Secondary | ICD-10-CM

## 2014-10-22 ENCOUNTER — Encounter (HOSPITAL_BASED_OUTPATIENT_CLINIC_OR_DEPARTMENT_OTHER): Payer: Medicare Other

## 2014-10-22 ENCOUNTER — Encounter (HOSPITAL_COMMUNITY): Payer: Self-pay | Admitting: Emergency Medicine

## 2014-10-22 ENCOUNTER — Other Ambulatory Visit (HOSPITAL_COMMUNITY): Payer: Self-pay | Admitting: Oncology

## 2014-10-22 ENCOUNTER — Other Ambulatory Visit: Payer: Self-pay

## 2014-10-22 ENCOUNTER — Emergency Department (HOSPITAL_COMMUNITY): Payer: Medicare Other

## 2014-10-22 ENCOUNTER — Emergency Department (HOSPITAL_COMMUNITY)
Admission: EM | Admit: 2014-10-22 | Discharge: 2014-10-22 | Disposition: A | Discharge status: Home / Self Care | Payer: Medicare Other | Attending: Emergency Medicine | Admitting: Emergency Medicine

## 2014-10-22 DIAGNOSIS — D509 Iron deficiency anemia, unspecified: Secondary | ICD-10-CM | POA: Diagnosis not present

## 2014-10-22 DIAGNOSIS — F329 Major depressive disorder, single episode, unspecified: Secondary | ICD-10-CM | POA: Insufficient documentation

## 2014-10-22 DIAGNOSIS — J45909 Unspecified asthma, uncomplicated: Secondary | ICD-10-CM | POA: Insufficient documentation

## 2014-10-22 DIAGNOSIS — R2241 Localized swelling, mass and lump, right lower limb: Secondary | ICD-10-CM | POA: Diagnosis not present

## 2014-10-22 DIAGNOSIS — Z86711 Personal history of pulmonary embolism: Secondary | ICD-10-CM | POA: Insufficient documentation

## 2014-10-22 DIAGNOSIS — R2242 Localized swelling, mass and lump, left lower limb: Secondary | ICD-10-CM | POA: Diagnosis not present

## 2014-10-22 DIAGNOSIS — Z87448 Personal history of other diseases of urinary system: Secondary | ICD-10-CM | POA: Diagnosis not present

## 2014-10-22 DIAGNOSIS — D693 Immune thrombocytopenic purpura: Secondary | ICD-10-CM

## 2014-10-22 DIAGNOSIS — R0600 Dyspnea, unspecified: Secondary | ICD-10-CM | POA: Diagnosis not present

## 2014-10-22 DIAGNOSIS — Z8619 Personal history of other infectious and parasitic diseases: Secondary | ICD-10-CM | POA: Diagnosis not present

## 2014-10-22 DIAGNOSIS — Z7952 Long term (current) use of systemic steroids: Secondary | ICD-10-CM | POA: Diagnosis not present

## 2014-10-22 DIAGNOSIS — E876 Hypokalemia: Secondary | ICD-10-CM

## 2014-10-22 DIAGNOSIS — Z87891 Personal history of nicotine dependence: Secondary | ICD-10-CM | POA: Diagnosis not present

## 2014-10-22 DIAGNOSIS — Z862 Personal history of diseases of the blood and blood-forming organs and certain disorders involving the immune mechanism: Secondary | ICD-10-CM | POA: Diagnosis not present

## 2014-10-22 DIAGNOSIS — R600 Localized edema: Secondary | ICD-10-CM

## 2014-10-22 DIAGNOSIS — R52 Pain, unspecified: Secondary | ICD-10-CM

## 2014-10-22 DIAGNOSIS — N289 Disorder of kidney and ureter, unspecified: Secondary | ICD-10-CM

## 2014-10-22 DIAGNOSIS — Z794 Long term (current) use of insulin: Secondary | ICD-10-CM | POA: Diagnosis not present

## 2014-10-22 DIAGNOSIS — M7989 Other specified soft tissue disorders: Secondary | ICD-10-CM | POA: Diagnosis not present

## 2014-10-22 DIAGNOSIS — Z79899 Other long term (current) drug therapy: Secondary | ICD-10-CM | POA: Insufficient documentation

## 2014-10-22 DIAGNOSIS — I10 Essential (primary) hypertension: Secondary | ICD-10-CM | POA: Diagnosis not present

## 2014-10-22 DIAGNOSIS — R2243 Localized swelling, mass and lump, lower limb, bilateral: Secondary | ICD-10-CM | POA: Insufficient documentation

## 2014-10-22 DIAGNOSIS — R0602 Shortness of breath: Secondary | ICD-10-CM | POA: Diagnosis not present

## 2014-10-22 DIAGNOSIS — G8929 Other chronic pain: Secondary | ICD-10-CM | POA: Diagnosis not present

## 2014-10-22 DIAGNOSIS — M79672 Pain in left foot: Secondary | ICD-10-CM

## 2014-10-22 LAB — BASIC METABOLIC PANEL
ANION GAP: 7 (ref 5–15)
BUN: 52 mg/dL — ABNORMAL HIGH (ref 6–23)
CALCIUM: 9.1 mg/dL (ref 8.4–10.5)
CO2: 28 mmol/L (ref 19–32)
Chloride: 104 mmol/L (ref 96–112)
Creatinine, Ser: 1.49 mg/dL — ABNORMAL HIGH (ref 0.50–1.10)
GFR calc Af Amer: 47 mL/min — ABNORMAL LOW (ref >90)
GFR calc non Af Amer: 40 mL/min — ABNORMAL LOW (ref >90)
GLUCOSE: 104 mg/dL — AB (ref 70–99)
POTASSIUM: 3.2 mmol/L — AB (ref 3.5–5.1)
Sodium: 139 mmol/L (ref 135–145)

## 2014-10-22 LAB — CBC WITH DIFFERENTIAL/PLATELET
BASOS PCT: 1 % (ref 0–1)
BASOS PCT: 1 % (ref 0–1)
Basophils Absolute: 0.1 10*3/uL (ref 0.0–0.1)
Basophils Absolute: 0.1 10*3/uL (ref 0.0–0.1)
EOS PCT: 1 % (ref 0–5)
Eosinophils Absolute: 0.1 10*3/uL (ref 0.0–0.7)
Eosinophils Absolute: 0.1 10*3/uL (ref 0.0–0.7)
Eosinophils Relative: 1 % (ref 0–5)
HCT: 44.6 % (ref 36.0–46.0)
HCT: 44.7 % (ref 36.0–46.0)
HEMOGLOBIN: 14.3 g/dL (ref 12.0–15.0)
Hemoglobin: 14.3 g/dL (ref 12.0–15.0)
LYMPHS PCT: 38 % (ref 12–46)
Lymphocytes Relative: 31 % (ref 12–46)
Lymphs Abs: 3.1 10*3/uL (ref 0.7–4.0)
Lymphs Abs: 2.6 10*3/uL (ref 0.7–4.0)
MCH: 28.3 pg (ref 26.0–34.0)
MCH: 28.1 pg (ref 26.0–34.0)
MCHC: 32.1 g/dL (ref 30.0–36.0)
MCHC: 32 g/dL (ref 30.0–36.0)
MCV: 88.1 fL (ref 78.0–100.0)
MCV: 87.8 fL (ref 78.0–100.0)
MONO ABS: 1.1 10*3/uL — AB (ref 0.1–1.0)
MONOS PCT: 13 % — AB (ref 3–12)
Monocytes Absolute: 1.1 10*3/uL — ABNORMAL HIGH (ref 0.1–1.0)
Monocytes Relative: 13 % — ABNORMAL HIGH (ref 3–12)
NEUTROS ABS: 3.9 10*3/uL (ref 1.7–7.7)
NEUTROS ABS: 4.4 10*3/uL (ref 1.7–7.7)
Neutrophils Relative %: 48 % (ref 43–77)
Neutrophils Relative %: 54 % (ref 43–77)
Platelets: 180 10*3/uL (ref 150–400)
Platelets: 181 10*3/uL (ref 150–400)
RBC: 5.06 MIL/uL (ref 3.87–5.11)
RBC: 5.09 MIL/uL (ref 3.87–5.11)
RDW: 16 % — AB (ref 11.5–15.5)
RDW: 15.8 % — ABNORMAL HIGH (ref 11.5–15.5)
WBC: 8.3 10*3/uL (ref 4.0–10.5)
WBC: 8.2 10*3/uL (ref 4.0–10.5)

## 2014-10-22 LAB — COMPREHENSIVE METABOLIC PANEL
ALK PHOS: 90 U/L (ref 39–117)
ALT: 35 U/L (ref 0–35)
AST: 26 U/L (ref 0–37)
Albumin: 3.5 g/dL (ref 3.5–5.2)
Anion gap: 7 (ref 5–15)
BUN: 54 mg/dL — ABNORMAL HIGH (ref 6–23)
CO2: 29 mmol/L (ref 19–32)
CREATININE: 1.61 mg/dL — AB (ref 0.50–1.10)
Calcium: 9.4 mg/dL (ref 8.4–10.5)
Chloride: 105 mmol/L (ref 96–112)
GFR calc Af Amer: 43 mL/min — ABNORMAL LOW (ref >90)
GFR calc non Af Amer: 37 mL/min — ABNORMAL LOW (ref >90)
Glucose, Bld: 113 mg/dL — ABNORMAL HIGH (ref 70–99)
Potassium: 3.4 mmol/L — ABNORMAL LOW (ref 3.5–5.1)
Sodium: 141 mmol/L (ref 135–145)
Total Bilirubin: 0.5 mg/dL (ref 0.3–1.2)
Total Protein: 6.3 g/dL (ref 6.0–8.3)

## 2014-10-22 LAB — BRAIN NATRIURETIC PEPTIDE: B NATRIURETIC PEPTIDE 5: 40 pg/mL (ref 0.0–100.0)

## 2014-10-22 LAB — FERRITIN: FERRITIN: 52 ng/mL (ref 10–291)

## 2014-10-22 LAB — TROPONIN I: Troponin I: <0.03 ng/mL (ref <0.031)

## 2014-10-22 MED ORDER — HYDROCODONE-ACETAMINOPHEN 5-325 MG PO TABS: ORAL_TABLET | ORAL | Status: DC

## 2014-10-22 MED ORDER — ONDANSETRON HCL 4 MG/2ML IJ SOLN
4.0000 mg | Freq: Once | INTRAMUSCULAR | Status: AC
  Administered 2014-10-22: 4 mg via INTRAVENOUS
  Filled 2014-10-22: qty 2

## 2014-10-22 MED ORDER — POTASSIUM CHLORIDE CRYS ER 20 MEQ PO TBCR: 20.0000 meq | EXTENDED_RELEASE_TABLET | Freq: Every day | ORAL | Status: DC

## 2014-10-22 MED ORDER — MORPHINE SULFATE 4 MG/ML IJ SOLN
4.0000 mg | Freq: Once | INTRAMUSCULAR | Status: AC
  Administered 2014-10-22: 4 mg via INTRAVENOUS
  Filled 2014-10-22: qty 1

## 2014-10-22 NOTE — ED Notes (Signed)
PT c/o bilateral lower extremity edema worsening over the past 2 weeks. PT states her primary doctor has increased her lasix. PT also c/o SOB at times over the past 2 weeks.

## 2014-10-22 NOTE — Progress Notes (Signed)
LABS FOR CMP,CBCD,FERR

## 2014-10-22 NOTE — Discharge Instructions (Signed)
Peripheral Edema °You have swelling in your legs (peripheral edema). This swelling is due to excess accumulation of salt and water in your body. Edema may be a sign of heart, kidney or liver disease, or a side effect of a medication. It may also be due to problems in the leg veins. Elevating your legs and using special support stockings may be very helpful, if the cause of the swelling is due to poor venous circulation. Avoid long periods of standing, whatever the cause. °Treatment of edema depends on identifying the cause. Chips, pretzels, pickles and other salty foods should be avoided. Restricting salt in your diet is almost always needed. Water pills (diuretics) are often used to remove the excess salt and water from your body via urine. These medicines prevent the kidney from reabsorbing sodium. This increases urine flow. °Diuretic treatment may also result in lowering of potassium levels in your body. Potassium supplements may be needed if you have to use diuretics daily. Daily weights can help you keep track of your progress in clearing your edema. You should call your caregiver for follow up care as recommended. °SEEK IMMEDIATE MEDICAL CARE IF:  °· You have increased swelling, pain, redness, or heat in your legs. °· You develop shortness of breath, especially when lying down. °· You develop chest or abdominal pain, weakness, or fainting. °· You have a fever. °Document Released: 10/22/2004 Document Revised: 12/07/2011 Document Reviewed: 10/02/2009 °[employer]® Patient Information ©2015 [employer]. This information is not intended to replace advice given to you by your health care provider. Make sure you discuss any questions you have with your health care provider. ° °

## 2014-10-22 NOTE — ED Provider Notes (Signed)
CSN: [id number]     Arrival date & time 10/22/14  1660 History   First MD Initiated Contact with Patient 10/22/14 [id number]     Chief Complaint  Patient presents with  . Leg Swelling     (Consider location/radiation/quality/duration/timing/severity/associated sxs/prior Treatment) HPI   Summer Cook is a 49 y.o. female who presents to the Emergency Department complaining of bilateral lower leg edema that has been worsening for 2 weeks.  She reports history of same and takes Lasix daily.  She states that her PMD advised her to increase her Lasix dose to two tabs daily.  She denies improvement of her symptoms despite the increase.  She reports intermittent shortness of breath that occurs mainly with exertion.  She has not tried elevating her legs.  She denies chest pain, fever, chills, numbness or weakness or hx of DVT's.    Patient also c/o pain to her left foot near the base of her toes.  Pain is worse with weight bearing.  She denies swelling of her foot, recently injury, redness or numbness   Past Medical History  Diagnosis Date  . Essential hypertension, benign   . 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  Last was Prednisone 83m; 2 wk in 10/2012.  She also was on Prednisone bridged with Cellcept briefly but stopped due to lack of insurance.   . Depression   . Thrush, oral 05/29/2011  . Steroid-induced diabetes   . Pulmonary embolism 04/2012  . Asthma   . Renal insufficiency   . Chronic chest pain   . Normal cardiac stress test 02/2014   Past Surgical History  Procedure Laterality Date  . Cholecystectomy  2008  . Bone marrow biopsy    . 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 Colonoscopy with esophagogastroduodenoscopy (egd) N/A 01/04/2013    Dr. RGala Romney esophageal plaques with +KOH, hh. Gastric antrum abnormal, bx reactive gastropathy. Anal canal hemorrhoids, colonic tics, normal TI,  single polyp (sessile serrated tubular adenoma). Next TCS 12/2017  . Cataract extraction     Family History  Problem Relation Age of Onset  . Cervical cancer Mother   . Lung cancer Father   . Pneumonia Brother   . Cancer Paternal Grandmother 845   colon cancer, breast cancer  . Cancer Paternal Grandfather 659   colon cancer  . AAA (abdominal aortic aneurysm) Paternal Grandmother    History  Substance Use Topics  . Smoking status: Former Smoker -- 0 years    Types: Cigarettes    Quit date: 02/14/2009  . Smokeless tobacco: Never Used  . Alcohol Use: No   OB History    No data available     Review of Systems  Constitutional: Negative for fever, chills and appetite change.  HENT: Negative for congestion and trouble swallowing.   Respiratory: Positive for shortness of breath. Negative for cough, chest tightness and wheezing.   Cardiovascular: Positive for leg swelling. Negative for chest pain.  Gastrointestinal: Negative for nausea, vomiting and abdominal pain.  Genitourinary: Negative for dysuria and flank pain.  Musculoskeletal: Negative for arthralgias.  Skin: Negative for rash.  Neurological: Negative for dizziness, weakness, numbness and headaches.  Hematological: Negative for adenopathy.  All other systems reviewed and are negative.     Allergies  Yellow jacket venom  Home Medications   Prior to Admission medications   Medication Sig Start Date End Date Taking? Authorizing Provider  albuterol (PROVENTIL  HFA;VENTOLIN HFA) 108 (90 BASE) MCG/ACT inhaler Inhale 2 puffs into the lungs every 6 (six) hours as needed for wheezing or shortness of breath.     Historical Provider, MD  ALPRAZolam Duanne Moron) 1 MG tablet Take 1 mg by mouth 3 (three) times daily as needed for anxiety.     Historical Provider, MD  calcitonin, salmon, (MIACALCIN/FORTICAL) 200 UNIT/ACT nasal spray Place 1 spray into alternate nostrils daily.    Historical Provider, MD  dexamethasone (DECADRON) 0.5 MG  tablet Take 0.5 mg by mouth 2 (two) times daily.     Historical Provider, MD  furosemide (LASIX) 20 MG tablet Take 1 tablet (20 mg total) by mouth daily. Restart in 3 days 04/29/14   Kathie Dike, MD  HYDROcodone-acetaminophen (NORCO) 10-325 MG per tablet Take 1 tablet by mouth every 6 (six) hours as needed. 05/07/14   Historical Provider, MD  HYDROcodone-acetaminophen (NORCO/VICODIN) 5-325 MG per tablet Take 1 tablet by mouth every 4 (four) hours as needed for moderate pain or severe pain. 07/27/14   Shaune Pollack, MD  insulin aspart (NOVOLOG) 100 UNIT/ML injection Inject 2-5 Units into the skin daily as needed for high blood sugar. Per sliding scale.    Historical Provider, MD  ipratropium-albuterol (DUONEB) 0.5-2.5 (3) MG/3ML SOLN Take 3 mLs by nebulization 3 (three) times daily as needed (shorntess of breath/wheezing).  03/22/13   Historical Provider, MD  metoprolol (LOPRESSOR) 50 MG tablet Take 50 mg by mouth 2 (two) times daily. 06/23/14   Historical Provider, MD  mometasone (NASONEX) 50 MCG/ACT nasal spray Place 2 sprays into the nose daily.    Historical Provider, MD  montelukast (SINGULAIR) 10 MG tablet Take 10 mg by mouth daily.    Historical Provider, MD  Multiple Vitamin (MULTIVITAMIN WITH MINERALS) TABS tablet Take 1 tablet by mouth daily.    Historical Provider, MD  ondansetron (ZOFRAN) 4 MG tablet Take 1 tablet (4 mg total) by mouth every 6 (six) hours. 05/01/14   Ezequiel Essex, MD  potassium chloride SA (K-DUR,KLOR-CON) 20 MEQ tablet Take 1 tablet (20 mEq total) by mouth daily. 04/29/14   Kathie Dike, MD  sertraline (ZOLOFT) 50 MG tablet Take 150 mg by mouth daily.     Historical Provider, MD  vitamin B-12 (CYANOCOBALAMIN) 1000 MCG tablet Take 1,000 mcg by mouth daily.    Historical Provider, MD   BP 135/90 mmHg  Pulse 72  Temp(Src) 98.4 F (36.9 C) (Oral)  Resp 18  Ht 5' 5" (1.651 m)  Wt 225 lb (102.059 kg)  BMI 37.44 kg/m2  SpO2 95%  LMP 05/13/2011 Physical Exam   Constitutional: She is oriented to person, place, and time. She appears well-developed and well-nourished. No distress.  Pt speaking in complete sentences without difficulty  HENT:  Head: Normocephalic and atraumatic.  Mouth/Throat: Oropharynx is clear and moist.  Neck: Normal range of motion. Neck supple. No JVD present. No thyromegaly present.  Cardiovascular: Normal rate, regular rhythm, normal heart sounds and intact distal pulses.   No murmur heard. Pulmonary/Chest: Effort normal and breath sounds normal. No respiratory distress. She has no wheezes. She has no rales. She exhibits no tenderness.  Abdominal: She exhibits no distension. There is no tenderness. There is no guarding.  Musculoskeletal: Normal range of motion. She exhibits edema. She exhibits no tenderness.  1+ bilateral LE edema.  No pitting.  No erythema.  Compartments soft.  Negative Homan's sign.  DP pulses brisk bilaterally.  Tender to palp at the base of the toes of  the left foot.  No bony deformity, no wounds between the toes.    Lymphadenopathy:    She has no cervical adenopathy.  Neurological: She is alert and oriented to person, place, and time. She exhibits normal muscle tone. Coordination normal.  Skin: Skin is warm and dry. No erythema.  Psychiatric: She has a normal mood and affect. Her behavior is normal.  Nursing note and vitals reviewed.   ED Course  Procedures (including critical care time) Labs Review Labs Reviewed  CBC WITH DIFFERENTIAL/PLATELET - Abnormal; Notable for the following:    RDW 15.8 (*)    Monocytes Relative 13 (*)    Monocytes Absolute 1.1 (*)    All other components within normal limits  BASIC METABOLIC PANEL - Abnormal; Notable for the following:    Potassium 3.2 (*)    Glucose, Bld 104 (*)    BUN 52 (*)    Creatinine, Ser 1.49 (*)    GFR calc non Af Amer 40 (*)    GFR calc Af Amer 47 (*)    All other components within normal limits  TROPONIN I  BRAIN NATRIURETIC PEPTIDE     Imaging Review Dg Chest 2 View  10/22/2014   CLINICAL DATA:  Intermittent dyspnea of 2 weeks duration.  EXAM: CHEST  2 VIEW  COMPARISON:  07/27/2014  FINDINGS: A single portable view of the chest obtained in a sitting position demonstrates unchanged moderately prominent cardiac contours. The lungs are grossly clear. No large effusion or pneumothorax is evident. Pulmonary vasculature is normal.  IMPRESSION: No acute findings   Electronically Signed   By: Andreas Newport M.D.   On: 10/22/2014 11:37   Dg Foot Complete Left  10/22/2014   CLINICAL DATA:  Swelling over the dorsum of the distal aspect of the left foot for 2 weeks. No known injury.  EXAM: LEFT FOOT - COMPLETE 3+ VIEW  COMPARISON:  Plain films the left foot 12/22/2012.  FINDINGS: No fracture or other focal bony or joint abnormality is identified. Soft tissues do not appear notably different compared to the prior examination.  IMPRESSION: Negative exam.   Electronically Signed   By: Inge Rise M.D.   On: 10/22/2014 11:38      Date: 10/22/2014  Rate: 70  Rhythm: normal sinus rhythm  QRS Axis: normal  Intervals: normal  ST/T Wave abnormalities: normal  Conduction Disutrbances:none  Narrative Interpretation: old appearing inferior infarct   EKG read by Dr. Crosby Oyster.    MDM   Final diagnoses:  Pain  Foot pain, left  Bilateral lower extremity edema    Pt is well appearing.  Labs are reassuring, potassium mildly low, no EKG changes.  Vitals stable , no respiratory distress during ED stay.  Pt advised to continue her current Lasix regimen and increase potassium to 40 mEq for 3 days and arrange a close f/u with PMD in 2-3 days.  Strict return precautions also given.  Pt is feeling better and ready for d/c, she agrees to plan.   Vieva Brummitt L. Vanessa [place], PA-C 10/24/14 [place], MD 10/25/14 1524

## 2014-10-22 NOTE — ED Notes (Signed)
Pt alert & oriented x4, stable gait. Patient given discharge instructions, paperwork & prescription(s). Patient  instructed to stop at the registration desk to finish any additional paperwork. Patient verbalized understanding. Pt left department w/ no further questions. 

## 2014-10-22 NOTE — Progress Notes (Signed)
Called pt to notify her that platelets were WNL and potassium was low.  Prescription  Called into [facility] for potassium and repeat labs next Thursday.

## 2014-10-23 ENCOUNTER — Other Ambulatory Visit: Payer: Self-pay | Admitting: Cardiology

## 2014-10-24 ENCOUNTER — Encounter (HOSPITAL_COMMUNITY): Payer: Self-pay

## 2014-10-24 ENCOUNTER — Emergency Department (HOSPITAL_COMMUNITY)
Admission: EM | Admit: 2014-10-24 | Discharge: 2014-10-24 | Disposition: A | Discharge status: Home / Self Care | Payer: Medicare Other | Attending: Emergency Medicine | Admitting: Emergency Medicine

## 2014-10-24 ENCOUNTER — Other Ambulatory Visit: Payer: Self-pay

## 2014-10-24 DIAGNOSIS — Z862 Personal history of diseases of the blood and blood-forming organs and certain disorders involving the immune mechanism: Secondary | ICD-10-CM | POA: Insufficient documentation

## 2014-10-24 DIAGNOSIS — Z79899 Other long term (current) drug therapy: Secondary | ICD-10-CM | POA: Insufficient documentation

## 2014-10-24 DIAGNOSIS — E119 Type 2 diabetes mellitus without complications: Secondary | ICD-10-CM | POA: Insufficient documentation

## 2014-10-24 DIAGNOSIS — R609 Edema, unspecified: Secondary | ICD-10-CM

## 2014-10-24 DIAGNOSIS — Z794 Long term (current) use of insulin: Secondary | ICD-10-CM | POA: Diagnosis not present

## 2014-10-24 DIAGNOSIS — Z8619 Personal history of other infectious and parasitic diseases: Secondary | ICD-10-CM | POA: Insufficient documentation

## 2014-10-24 DIAGNOSIS — F329 Major depressive disorder, single episode, unspecified: Secondary | ICD-10-CM | POA: Insufficient documentation

## 2014-10-24 DIAGNOSIS — Z86711 Personal history of pulmonary embolism: Secondary | ICD-10-CM | POA: Insufficient documentation

## 2014-10-24 DIAGNOSIS — M109 Gout, unspecified: Secondary | ICD-10-CM

## 2014-10-24 DIAGNOSIS — Z7951 Long term (current) use of inhaled steroids: Secondary | ICD-10-CM | POA: Insufficient documentation

## 2014-10-24 DIAGNOSIS — Z87891 Personal history of nicotine dependence: Secondary | ICD-10-CM | POA: Insufficient documentation

## 2014-10-24 DIAGNOSIS — Z87448 Personal history of other diseases of urinary system: Secondary | ICD-10-CM | POA: Insufficient documentation

## 2014-10-24 DIAGNOSIS — G8929 Other chronic pain: Secondary | ICD-10-CM | POA: Diagnosis not present

## 2014-10-24 DIAGNOSIS — J45901 Unspecified asthma with (acute) exacerbation: Secondary | ICD-10-CM | POA: Diagnosis not present

## 2014-10-24 DIAGNOSIS — M79675 Pain in left toe(s): Secondary | ICD-10-CM | POA: Diagnosis present

## 2014-10-24 DIAGNOSIS — R0602 Shortness of breath: Secondary | ICD-10-CM | POA: Insufficient documentation

## 2014-10-24 DIAGNOSIS — I10 Essential (primary) hypertension: Secondary | ICD-10-CM | POA: Insufficient documentation

## 2014-10-24 LAB — URINE MICROSCOPIC-ADD ON

## 2014-10-24 LAB — URINALYSIS, ROUTINE W REFLEX MICROSCOPIC
Bilirubin Urine: NEGATIVE
Glucose, UA: NEGATIVE mg/dL
Ketones, ur: NEGATIVE mg/dL
Leukocytes, UA: NEGATIVE
NITRITE: NEGATIVE
Protein, ur: NEGATIVE mg/dL
Specific Gravity, Urine: 1.011 (ref 1.005–1.030)
Urobilinogen, UA: 0.2 mg/dL (ref 0.0–1.0)
pH: 6.5 (ref 5.0–8.0)

## 2014-10-24 LAB — CBC WITH DIFFERENTIAL/PLATELET
BASOS PCT: 0 % (ref 0–1)
Basophils Absolute: 0 10*3/uL (ref 0.0–0.1)
Eosinophils Absolute: 0 10*3/uL (ref 0.0–0.7)
Eosinophils Relative: 0 % (ref 0–5)
HCT: 46.1 % — ABNORMAL HIGH (ref 36.0–46.0)
Hemoglobin: 15 g/dL (ref 12.0–15.0)
Lymphocytes Relative: 29 % (ref 12–46)
Lymphs Abs: 2.5 10*3/uL (ref 0.7–4.0)
MCH: 28.4 pg (ref 26.0–34.0)
MCHC: 32.5 g/dL (ref 30.0–36.0)
MCV: 87.1 fL (ref 78.0–100.0)
Monocytes Absolute: 1 10*3/uL (ref 0.1–1.0)
Monocytes Relative: 12 % (ref 3–12)
NEUTROS PCT: 59 % (ref 43–77)
Neutro Abs: 5.2 10*3/uL (ref 1.7–7.7)
Platelets: 181 10*3/uL (ref 150–400)
RBC: 5.29 MIL/uL — ABNORMAL HIGH (ref 3.87–5.11)
RDW: 15.9 % — ABNORMAL HIGH (ref 11.5–15.5)
WBC: 8.9 10*3/uL (ref 4.0–10.5)

## 2014-10-24 LAB — BRAIN NATRIURETIC PEPTIDE: B NATRIURETIC PEPTIDE 5: 54.3 pg/mL (ref 0.0–100.0)

## 2014-10-24 LAB — BASIC METABOLIC PANEL
ANION GAP: 11 (ref 5–15)
BUN: 31 mg/dL — ABNORMAL HIGH (ref 6–23)
CO2: 28 mmol/L (ref 19–32)
Calcium: 9.1 mg/dL (ref 8.4–10.5)
Chloride: 101 mmol/L (ref 96–112)
Creatinine, Ser: 1.48 mg/dL — ABNORMAL HIGH (ref 0.50–1.10)
GFR calc non Af Amer: 41 mL/min — ABNORMAL LOW (ref >90)
GFR, EST AFRICAN AMERICAN: 47 mL/min — AB (ref >90)
Glucose, Bld: 71 mg/dL (ref 70–99)
POTASSIUM: 3.6 mmol/L (ref 3.5–5.1)
Sodium: 140 mmol/L (ref 135–145)

## 2014-10-24 LAB — I-STAT TROPONIN, ED: TROPONIN I, POC: 0 ng/mL (ref 0.00–0.08)

## 2014-10-24 MED ORDER — OXYCODONE-ACETAMINOPHEN 5-325 MG PO TABS: 2.0000 | ORAL_TABLET | Freq: Four times a day (QID) | ORAL | Status: DC | PRN

## 2014-10-24 MED ORDER — MORPHINE SULFATE 4 MG/ML IJ SOLN
4.0000 mg | Freq: Once | INTRAMUSCULAR | Status: AC
  Administered 2014-10-24: 4 mg via INTRAMUSCULAR
  Filled 2014-10-24: qty 1

## 2014-10-24 MED ORDER — PREDNISONE 20 MG PO TABS: 40.0000 mg | ORAL_TABLET | Freq: Every day | ORAL | Status: DC

## 2014-10-24 MED ORDER — ONDANSETRON 4 MG PO TBDP
8.0000 mg | ORAL_TABLET | Freq: Once | ORAL | Status: AC
  Administered 2014-10-24: 8 mg via ORAL
  Filled 2014-10-24: qty 2

## 2014-10-24 MED ORDER — OXYCODONE-ACETAMINOPHEN 5-325 MG PO TABS
2.0000 | ORAL_TABLET | Freq: Once | ORAL | Status: AC
  Administered 2014-10-24: 2 via ORAL
  Filled 2014-10-24: qty 2

## 2014-10-24 NOTE — ED Notes (Addendum)
Pt c/o L foot pain x 1 week. Reports being seen at [facility] for the same. Hx of PE; tenderness noted to L calm. Pt took 4x her normal dose of Lasix (total of 80mg  per patient) this morning for fluid retention without increased urination. Edema noted to bilateral limbs. Pt also reports increased pain to L foot when bearing weight. Denies chest pain; endorses shortness of breath for over a week.

## 2014-10-24 NOTE — ED Notes (Signed)
PHlebotomy at the bedside.

## 2014-10-24 NOTE — ED Notes (Signed)
Pt here for evaluation of left foot pain that has been hurting x 4 days per pt.  Pt seen and evaluated at [facility] for same on Monday.

## 2014-10-24 NOTE — Discharge Instructions (Signed)
Edema °Edema is an abnormal buildup of fluids in your body tissues. Edema is somewhat dependent on gravity to pull the fluid to the lowest place in your body. That makes the condition more common in the legs and thighs (lower extremities). Painless swelling of the feet and ankles is common and becomes more likely as you get older. It is also common in looser tissues, like around your eyes.  °When the affected area is squeezed, the fluid may move out of that spot and leave a dent for a few moments. This dent is called pitting.  °CAUSES  °There are many possible causes of edema. Eating too much salt and being on your feet or sitting for a long time can cause edema in your legs and ankles. Hot weather may make edema worse. Common medical causes of edema include: °· Heart failure. °· Liver disease. °· Kidney disease. °· Weak blood vessels in your legs. °· Cancer. °· An injury. °· Pregnancy. °· Some medications. °· Obesity.  °SYMPTOMS  °Edema is usually painless. Your skin may look swollen or shiny.  °DIAGNOSIS  °Your health care provider may be able to diagnose edema by asking about your medical history and doing a physical exam. You may need to have tests such as X-rays, an electrocardiogram, or blood tests to check for medical conditions that may cause edema.  °TREATMENT  °Edema treatment depends on the cause. If you have heart, liver, or kidney disease, you need the treatment appropriate for these conditions. General treatment may include: °· Elevation of the affected body part above the level of your heart. °· Compression of the affected body part. Pressure from elastic bandages or support stockings squeezes the tissues and forces fluid back into the blood vessels. This keeps fluid from entering the tissues. °· Restriction of fluid and salt intake. °· Use of a water pill (diuretic). These medications are appropriate only for some types of edema. They pull fluid out of your body and make you urinate more often. This  gets rid of fluid and reduces swelling, but diuretics can have side effects. Only use diuretics as directed by your health care provider. °HOME CARE INSTRUCTIONS  °· Keep the affected body part above the level of your heart when you are lying down.   °· Do not sit still or stand for prolonged periods.   °· Do not put anything directly under your knees when lying down. °· Do not wear constricting clothing or garters on your upper legs.   °· Exercise your legs to work the fluid back into your blood vessels. This may help the swelling go down.   °· Wear elastic bandages or support stockings to reduce ankle swelling as directed by your health care provider.   °· Eat a low-salt diet to reduce fluid if your health care provider recommends it.   °· Only take medicines as directed by your health care provider.  °SEEK MEDICAL CARE IF:  °· Your edema is not responding to treatment. °· You have heart, liver, or kidney disease and notice symptoms of edema. °· You have edema in your legs that does not improve after elevating them.   °· You have sudden and unexplained weight gain. °SEEK IMMEDIATE MEDICAL CARE IF:  °· You develop shortness of breath or chest pain.   °· You cannot breathe when you lie down. °· You develop pain, redness, or warmth in the swollen areas.   °· You have heart, liver, or kidney disease and suddenly get edema. °· You have a fever and your symptoms suddenly get worse. °MAKE SURE YOU:  °·   Understand these instructions.  Will watch your condition.  Will get help right away if you are not doing well or get worse. Document Released: 09/14/2005 Document Revised: 01/29/2014 Document Reviewed: 07/07/2013 [facility] Patient Information 2015 [place], [place]. This information is not intended to replace advice given to you by your health care provider. Make sure you discuss any questions you have with your health care provider.  Gout Gout is an inflammatory arthritis caused by a buildup of uric acid crystals  in the joints. Uric acid is a chemical that is normally present in the blood. When the level of uric acid in the blood is too high it can form crystals that deposit in your joints and tissues. This causes joint redness, soreness, and swelling (inflammation). Repeat attacks are common. Over time, uric acid crystals can form into masses (tophi) near a joint, destroying bone and causing disfigurement. Gout is treatable and often preventable. CAUSES  The disease begins with elevated levels of uric acid in the blood. Uric acid is produced by your body when it breaks down a naturally found substance called purines. Certain foods you eat, such as meats and fish, contain high amounts of purines. Causes of an elevated uric acid level include:  Being passed down from parent to child (heredity).  Diseases that cause increased uric acid production (such as obesity, psoriasis, and certain cancers).  Excessive alcohol use.  Diet, especially diets rich in meat and seafood.  Medicines, including certain cancer-fighting medicines (chemotherapy), water pills (diuretics), and aspirin.  Chronic kidney disease. The kidneys are no longer able to remove uric acid well.  Problems with metabolism. Conditions strongly associated with gout include:  Obesity.  High blood pressure.  High cholesterol.  Diabetes. Not everyone with elevated uric acid levels gets gout. It is not understood why some people get gout and others do not. Surgery, joint injury, and eating too much of certain foods are some of the factors that can lead to gout attacks. SYMPTOMS   An attack of gout comes on quickly. It causes intense pain with redness, swelling, and warmth in a joint.  Fever can occur.  Often, only one joint is involved. Certain joints are more commonly involved:  Base of the big toe.  Knee.  Ankle.  Wrist.  Finger. Without treatment, an attack usually goes away in a few days to weeks. Between attacks, you  usually will not have symptoms, which is different from many other forms of arthritis. DIAGNOSIS  Your caregiver will suspect gout based on your symptoms and exam. In some cases, tests may be recommended. The tests may include:  Blood tests.  Urine tests.  X-rays.  Joint fluid exam. This exam requires a needle to remove fluid from the joint (arthrocentesis). Using a microscope, gout is confirmed when uric acid crystals are seen in the joint fluid. TREATMENT  There are two phases to gout treatment: treating the sudden onset (acute) attack and preventing attacks (prophylaxis).  Treatment of an Acute Attack.  Medicines are used. These include anti-inflammatory medicines or steroid medicines.  An injection of steroid medicine into the affected joint is sometimes necessary.  The painful joint is rested. Movement can worsen the arthritis.  You may use warm or cold treatments on painful joints, depending which works best for you.  Treatment to Prevent Attacks.  If you suffer from frequent gout attacks, your caregiver may advise preventive medicine. These medicines are started after the acute attack subsides. These medicines either help your kidneys eliminate uric acid  from your body or decrease your uric acid production. You may need to stay on these medicines for a very long time.  The early phase of treatment with preventive medicine can be associated with an increase in acute gout attacks. For this reason, during the first few months of treatment, your caregiver may also advise you to take medicines usually used for acute gout treatment. Be sure you understand your caregiver's directions. Your caregiver may make several adjustments to your medicine dose before these medicines are effective.  Discuss dietary treatment with your caregiver or dietitian. Alcohol and drinks high in sugar and fructose and foods such as meat, poultry, and seafood can increase uric acid levels. Your caregiver or  dietitian can advise you on drinks and foods that should be limited. HOME CARE INSTRUCTIONS   Do not take aspirin to relieve pain. This raises uric acid levels.  Only take over-the-counter or prescription medicines for pain, discomfort, or fever as directed by your caregiver.  Rest the joint as much as possible. When in bed, keep sheets and blankets off painful areas.  Keep the affected joint raised (elevated).  Apply warm or cold treatments to painful joints. Use of warm or cold treatments depends on which works best for you.  Use crutches if the painful joint is in your leg.  Drink enough fluids to keep your urine clear or pale yellow. This helps your body get rid of uric acid. Limit alcohol, sugary drinks, and fructose drinks.  Follow your dietary instructions. Pay careful attention to the amount of protein you eat. Your daily diet should emphasize fruits, vegetables, whole grains, and fat-free or low-fat milk products. Discuss the use of coffee, vitamin C, and cherries with your caregiver or dietitian. These may be helpful in lowering uric acid levels.  Maintain a healthy body weight. SEEK MEDICAL CARE IF:   You develop diarrhea, vomiting, or any side effects from medicines.  You do not feel better in 24 hours, or you are getting worse. SEEK IMMEDIATE MEDICAL CARE IF:   Your joint becomes suddenly more tender, and you have chills or a fever. MAKE SURE YOU:   Understand these instructions.  Will watch your condition.  Will get help right away if you are not doing well or get worse. Document Released: 09/11/2000 Document Revised: 01/29/2014 Document Reviewed: 04/27/2012 [facility] Patient Information 2015 [place], [place]. This information is not intended to replace advice given to you by your health care provider. Make sure you discuss any questions you have with your health care provider.

## 2014-10-24 NOTE — ED Notes (Signed)
Pt c/o some nausea after receiving morphine, PA browning made aware, advised pt can get 8mg  zofran odt

## 2014-10-24 NOTE — ED Notes (Signed)
EKG given to Dr. Harrison 

## 2014-10-24 NOTE — ED Provider Notes (Signed)
CSN: [id number]     Arrival date & time 10/24/14  1659 History   First MD Initiated Contact with Patient 10/24/14 1812     Chief Complaint  Patient presents with  . Foot Pain     (Consider location/radiation/quality/duration/timing/severity/associated sxs/prior Treatment) HPI Comments: Patient presents emergency department with past medical history of hypertension, ITP, steroid-induced diabetes, prior PE, asthma, with chief complaint of 2 complaints.  Shortness of breath: Patient complains of shortness of breath for several weeks. She states that it is progressively been worsening. She reports new lower extremity edema which is gradually worsening. She has taken Lasix with no relief. She has also tried elevating her legs with no relief. She denies pain in her legs. Denies any pain in her calves. Denies any chest pain. She denies any associated fevers or chills.  Toe pain: Patient reports toe pain that has been worsening for the past week. It is located on her left great toe. She denies any injuries. She was seen at [facility] plan for the same, and had negative plain films. She denies any associated fevers chills. She states that the toes exquisitely painful when touched. She states that the pain is constant. She denies history of gout.  The history is provided by the patient. No language interpreter was used.    Past Medical History  Diagnosis Date  . Essential hypertension, benign   . 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  Last was Prednisone $RemoveBefore'40mg'BnefcKAzgNiwc$ ; 2 wk in 10/2012.  She also was on Prednisone bridged with Cellcept briefly but stopped due to lack of insurance.   . Depression   . Thrush, oral 05/29/2011  . Steroid-induced diabetes   . Pulmonary embolism 04/2012  . Asthma   . Renal insufficiency   . Chronic chest pain   . Normal cardiac stress test 02/2014   Past Surgical History   Procedure Laterality Date  . Cholecystectomy  2008  . Bone marrow biopsy    . 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 Colonoscopy with esophagogastroduodenoscopy (egd) N/A 01/04/2013    Dr. Gala Romney: esophageal plaques with +KOH, hh. Gastric antrum abnormal, bx reactive gastropathy. Anal canal hemorrhoids, colonic tics, normal TI, single polyp (sessile serrated tubular adenoma). Next TCS 12/2017  . Cataract extraction     Family History  Problem Relation Age of Onset  . Cervical cancer Mother   . Lung cancer Father   . Pneumonia Brother   . Cancer Paternal Grandmother 40    colon cancer, breast cancer  . Cancer Paternal Grandfather 13    colon cancer  . AAA (abdominal aortic aneurysm) Paternal Grandmother    History  Substance Use Topics  . Smoking status: Former Smoker -- 0 years    Types: Cigarettes    Quit date: 02/14/2009  . Smokeless tobacco: Never Used  . Alcohol Use: No   OB History    No data available     Review of Systems  Constitutional: Negative for fever and chills.  Respiratory: Negative for shortness of breath.   Cardiovascular: Negative for chest pain.  Gastrointestinal: Negative for nausea, vomiting, diarrhea and constipation.  Genitourinary: Negative for dysuria.  Musculoskeletal: Positive for arthralgias.  All other systems reviewed and are negative.     Allergies  Yellow jacket venom  Home Medications   Prior to Admission medications   Medication Sig Start Date End Date Taking? Authorizing Provider  albuterol (PROVENTIL HFA;VENTOLIN HFA) 108 (90  BASE) MCG/ACT inhaler Inhale 2 puffs into the lungs every 6 (six) hours as needed for wheezing or shortness of breath.     Historical Provider, MD  ALPRAZolam Duanne Moron) 1 MG tablet Take 1 mg by mouth 3 (three) times daily as needed for anxiety.     Historical Provider, MD  ARIPiprazole (ABILIFY) 2 MG tablet Take 2 mg by mouth daily. 09/27/14   Historical Provider, MD  calcitonin, salmon, (MIACALCIN/FORTICAL) 200  UNIT/ACT nasal spray Place 1 spray into alternate nostrils daily.    Historical Provider, MD  dexamethasone (DECADRON) 0.5 MG tablet Take 0.5 mg by mouth 2 (two) times daily.     Historical Provider, MD  furosemide (LASIX) 20 MG tablet Take 1 tablet (20 mg total) by mouth daily. Restart in 3 days 04/29/14   Kathie Dike, MD  HYDROcodone-acetaminophen (NORCO/VICODIN) 5-325 MG per tablet Take one-two tabs po q 4-6 hrs prn pain 10/22/14   Tammy L. Triplett, PA-C  insulin aspart (NOVOLOG) 100 UNIT/ML injection Inject 2-5 Units into the skin daily as needed for high blood sugar. Per sliding scale.    Historical Provider, MD  ipratropium-albuterol (DUONEB) 0.5-2.5 (3) MG/3ML SOLN Take 3 mLs by nebulization 3 (three) times daily as needed (shorntess of breath/wheezing).  03/22/13   Historical Provider, MD  metoprolol (LOPRESSOR) 50 MG tablet Take 50 mg by mouth 2 (two) times daily. 06/23/14   Historical Provider, MD  metoprolol succinate (TOPROL-XL) 50 MG 24 hr tablet TAKE 1 TABLET BY MOUTH EVERY DAY 10/23/14   Satira Sark, MD  mometasone (NASONEX) 50 MCG/ACT nasal spray Place 2 sprays into the nose daily.    Historical Provider, MD  montelukast (SINGULAIR) 10 MG tablet Take 10 mg by mouth daily.    Historical Provider, MD  Multiple Vitamin (MULTIVITAMIN WITH MINERALS) TABS tablet Take 1 tablet by mouth daily.    Historical Provider, MD  potassium chloride SA (K-DUR,KLOR-CON) 20 MEQ tablet Take 1 tablet (20 mEq total) by mouth daily. 10/22/14   Baird Cancer, PA-C  sertraline (ZOLOFT) 50 MG tablet Take 150 mg by mouth daily.     Historical Provider, MD  vitamin B-12 (CYANOCOBALAMIN) 1000 MCG tablet Take 1,000 mcg by mouth daily.    Historical Provider, MD   BP 117/85 mmHg  Pulse 75  Temp(Src) 97.7 F (36.5 C)  Resp 16  SpO2 100%  LMP 05/13/2011 Physical Exam  Constitutional: She is oriented to person, place, and time. She appears well-developed and well-nourished.  HENT:  Head: Normocephalic  and atraumatic.  Eyes: Conjunctivae and EOM are normal. Pupils are equal, round, and reactive to light.  Neck: Normal range of motion. Neck supple.  Cardiovascular: Normal rate, regular rhythm and intact distal pulses.  Exam reveals no gallop and no friction rub.   No murmur heard. Intact distal pulses with brisk capillary refill  Pulmonary/Chest: Effort normal and breath sounds normal. No respiratory distress. She has no wheezes. She has no rales. She exhibits no tenderness.  Clear to auscultation bilaterally  Abdominal: Soft. Bowel sounds are normal. She exhibits no distension and no mass. There is no tenderness. There is no rebound and no guarding.  Musculoskeletal: Normal range of motion. She exhibits no edema or tenderness.  Left great toe very tender to palpation, range of motion and strength 5/5, no injuries, no bony abnormality or deformity  Neurological: She is alert and oriented to person, place, and time.  Sensation intact throughout  Skin: Skin is warm and dry.  Mild erythema surrounding  left great toe, no abscess, or evidence of infection  Psychiatric: She has a normal mood and affect. Her behavior is normal. Judgment and thought content normal.  Nursing note and vitals reviewed.   ED Course  Procedures (including critical care time) Results for orders placed or performed during the hospital encounter of 10/24/14  CBC with Differential/Platelet  Result Value Ref Range   WBC 8.9 4.0 - 10.5 K/uL   RBC 5.29 (H) 3.87 - 5.11 MIL/uL   Hemoglobin 15.0 12.0 - 15.0 g/dL   HCT 46.1 (H) 36.0 - 46.0 %   MCV 87.1 78.0 - 100.0 fL   MCH 28.4 26.0 - 34.0 pg   MCHC 32.5 30.0 - 36.0 g/dL   RDW 15.9 (H) 11.5 - 15.5 %   Platelets 181 150 - 400 K/uL   Neutrophils Relative % 59 43 - 77 %   Neutro Abs 5.2 1.7 - 7.7 K/uL   Lymphocytes Relative 29 12 - 46 %   Lymphs Abs 2.5 0.7 - 4.0 K/uL   Monocytes Relative 12 3 - 12 %   Monocytes Absolute 1.0 0.1 - 1.0 K/uL   Eosinophils Relative 0 0 -  5 %   Eosinophils Absolute 0.0 0.0 - 0.7 K/uL   Basophils Relative 0 0 - 1 %   Basophils Absolute 0.0 0.0 - 0.1 K/uL  Basic metabolic panel  Result Value Ref Range   Sodium 140 135 - 145 mmol/L   Potassium 3.6 3.5 - 5.1 mmol/L   Chloride 101 96 - 112 mmol/L   CO2 28 19 - 32 mmol/L   Glucose, Bld 71 70 - 99 mg/dL   BUN 31 (H) 6 - 23 mg/dL   Creatinine, Ser 1.48 (H) 0.50 - 1.10 mg/dL   Calcium 9.1 8.4 - 10.5 mg/dL   GFR calc non Af Amer 41 (L) >90 mL/min   GFR calc Af Amer 47 (L) >90 mL/min   Anion gap 11 5 - 15  Urinalysis, Routine w reflex microscopic  Result Value Ref Range   Color, Urine YELLOW YELLOW   APPearance CLEAR CLEAR   Specific Gravity, Urine 1.011 1.005 - 1.030   pH 6.5 5.0 - 8.0   Glucose, UA NEGATIVE NEGATIVE mg/dL   Hgb urine dipstick SMALL (A) NEGATIVE   Bilirubin Urine NEGATIVE NEGATIVE   Ketones, ur NEGATIVE NEGATIVE mg/dL   Protein, ur NEGATIVE NEGATIVE mg/dL   Urobilinogen, UA 0.2 0.0 - 1.0 mg/dL   Nitrite NEGATIVE NEGATIVE   Leukocytes, UA NEGATIVE NEGATIVE  Brain natriuretic peptide  Result Value Ref Range   B Natriuretic Peptide 54.3 0.0 - 100.0 pg/mL  Urine microscopic-add on  Result Value Ref Range   Squamous Epithelial / LPF FEW (A) RARE   WBC, UA 0-2 <3 WBC/hpf   RBC / HPF 0-2 <3 RBC/hpf   Bacteria, UA RARE RARE  I-stat troponin, ED  Result Value Ref Range   Troponin i, poc 0.00 0.00 - 0.08 ng/mL   Comment 3           Dg Chest 2 View  10/22/2014   CLINICAL DATA:  Intermittent dyspnea of 2 weeks duration.  EXAM: CHEST  2 VIEW  COMPARISON:  07/27/2014  FINDINGS: A single portable view of the chest obtained in a sitting position demonstrates unchanged moderately prominent cardiac contours. The lungs are grossly clear. No large effusion or pneumothorax is evident. Pulmonary vasculature is normal.  IMPRESSION: No acute findings   Electronically Signed   By: Valerie Roys.D.  On: 10/22/2014 11:37   Dg Foot Complete Left  10/22/2014    CLINICAL DATA:  Swelling over the dorsum of the distal aspect of the left foot for 2 weeks. No known injury.  EXAM: LEFT FOOT - COMPLETE 3+ VIEW  COMPARISON:  Plain films the left foot 12/22/2012.  FINDINGS: No fracture or other focal bony or joint abnormality is identified. Soft tissues do not appear notably different compared to the prior examination.  IMPRESSION: Negative exam.   Electronically Signed   By: Inge Rise M.D.   On: 10/22/2014 11:38     Imaging Review No results found.   EKG Interpretation None     EKG not crossing in EPIC MDM   Final diagnoses:  Edema  Gout of big toe    Patient with lower extremity edema bilaterally, and left great toe pain. Workup is negative today. Patient is feeling improved with pain medicine. No evidence of CHF. Prior chest x-ray reviewed. Patient has Lasix. Will encourage compression stockings. Discussed with Dr. Aline Brochure, who agrees with the plan. Will treat the patient with prednisone burst and some Percocet for the gout. Recommend primary care follow-up. Patient is stable and ready for discharge.    Montine Circle, PA-C 10/24/14 2208  Pamella Pert, MD 10/25/14 [phone]

## 2014-10-28 ENCOUNTER — Emergency Department (HOSPITAL_COMMUNITY)
Admission: EM | Admit: 2014-10-28 | Discharge: 2014-10-28 | Disposition: A | Discharge status: Home / Self Care | Payer: Medicare Other | Attending: Emergency Medicine | Admitting: Emergency Medicine

## 2014-10-28 ENCOUNTER — Encounter (HOSPITAL_COMMUNITY): Payer: Self-pay | Admitting: Emergency Medicine

## 2014-10-28 DIAGNOSIS — F329 Major depressive disorder, single episode, unspecified: Secondary | ICD-10-CM | POA: Insufficient documentation

## 2014-10-28 DIAGNOSIS — M79604 Pain in right leg: Secondary | ICD-10-CM | POA: Diagnosis not present

## 2014-10-28 DIAGNOSIS — Z86711 Personal history of pulmonary embolism: Secondary | ICD-10-CM | POA: Diagnosis not present

## 2014-10-28 DIAGNOSIS — I10 Essential (primary) hypertension: Secondary | ICD-10-CM | POA: Diagnosis not present

## 2014-10-28 DIAGNOSIS — Z79899 Other long term (current) drug therapy: Secondary | ICD-10-CM | POA: Diagnosis not present

## 2014-10-28 DIAGNOSIS — R1084 Generalized abdominal pain: Secondary | ICD-10-CM | POA: Insufficient documentation

## 2014-10-28 DIAGNOSIS — G8929 Other chronic pain: Secondary | ICD-10-CM | POA: Insufficient documentation

## 2014-10-28 DIAGNOSIS — E119 Type 2 diabetes mellitus without complications: Secondary | ICD-10-CM | POA: Diagnosis not present

## 2014-10-28 DIAGNOSIS — J45909 Unspecified asthma, uncomplicated: Secondary | ICD-10-CM | POA: Insufficient documentation

## 2014-10-28 DIAGNOSIS — Z87891 Personal history of nicotine dependence: Secondary | ICD-10-CM | POA: Insufficient documentation

## 2014-10-28 DIAGNOSIS — Z87448 Personal history of other diseases of urinary system: Secondary | ICD-10-CM | POA: Diagnosis not present

## 2014-10-28 DIAGNOSIS — Z7951 Long term (current) use of inhaled steroids: Secondary | ICD-10-CM | POA: Insufficient documentation

## 2014-10-28 DIAGNOSIS — Z9049 Acquired absence of other specified parts of digestive tract: Secondary | ICD-10-CM | POA: Diagnosis not present

## 2014-10-28 DIAGNOSIS — Z794 Long term (current) use of insulin: Secondary | ICD-10-CM | POA: Diagnosis not present

## 2014-10-28 DIAGNOSIS — M79661 Pain in right lower leg: Secondary | ICD-10-CM | POA: Diagnosis not present

## 2014-10-28 DIAGNOSIS — Z7952 Long term (current) use of systemic steroids: Secondary | ICD-10-CM | POA: Insufficient documentation

## 2014-10-28 DIAGNOSIS — R600 Localized edema: Secondary | ICD-10-CM | POA: Diagnosis not present

## 2014-10-28 DIAGNOSIS — M79605 Pain in left leg: Secondary | ICD-10-CM | POA: Insufficient documentation

## 2014-10-28 DIAGNOSIS — R601 Generalized edema: Secondary | ICD-10-CM | POA: Diagnosis not present

## 2014-10-28 DIAGNOSIS — M79662 Pain in left lower leg: Secondary | ICD-10-CM | POA: Diagnosis not present

## 2014-10-28 LAB — URINALYSIS, ROUTINE W REFLEX MICROSCOPIC
Bilirubin Urine: NEGATIVE
Glucose, UA: 100 mg/dL — AB
KETONES UR: NEGATIVE mg/dL
NITRITE: NEGATIVE
Specific Gravity, Urine: 1.015 (ref 1.005–1.030)
Urobilinogen, UA: 0.2 mg/dL (ref 0.0–1.0)
pH: 6.5 (ref 5.0–8.0)

## 2014-10-28 LAB — CBC WITH DIFFERENTIAL/PLATELET
Basophils Absolute: 0 10*3/uL (ref 0.0–0.1)
Basophils Relative: 0 % (ref 0–1)
Eosinophils Absolute: 0 10*3/uL (ref 0.0–0.7)
Eosinophils Relative: 0 % (ref 0–5)
HEMATOCRIT: 45.4 % (ref 36.0–46.0)
HEMOGLOBIN: 14.4 g/dL (ref 12.0–15.0)
LYMPHS PCT: 16 % (ref 12–46)
Lymphs Abs: 2.1 10*3/uL (ref 0.7–4.0)
MCH: 28.1 pg (ref 26.0–34.0)
MCHC: 31.7 g/dL (ref 30.0–36.0)
MCV: 88.5 fL (ref 78.0–100.0)
MONO ABS: 1.2 10*3/uL — AB (ref 0.1–1.0)
Monocytes Relative: 9 % (ref 3–12)
NEUTROS PCT: 75 % (ref 43–77)
Neutro Abs: 10 10*3/uL — ABNORMAL HIGH (ref 1.7–7.7)
Platelets: 186 10*3/uL (ref 150–400)
RBC: 5.13 MIL/uL — AB (ref 3.87–5.11)
RDW: 16 % — ABNORMAL HIGH (ref 11.5–15.5)
WBC: 13.3 10*3/uL — AB (ref 4.0–10.5)

## 2014-10-28 LAB — COMPREHENSIVE METABOLIC PANEL
ALK PHOS: 98 U/L (ref 39–117)
ALT: 34 U/L (ref 0–35)
AST: 20 U/L (ref 0–37)
Albumin: 3.5 g/dL (ref 3.5–5.2)
Anion gap: 9 (ref 5–15)
BUN: 35 mg/dL — ABNORMAL HIGH (ref 6–23)
CALCIUM: 9.3 mg/dL (ref 8.4–10.5)
CHLORIDE: 102 mmol/L (ref 96–112)
CO2: 28 mmol/L (ref 19–32)
Creatinine, Ser: 1.46 mg/dL — ABNORMAL HIGH (ref 0.50–1.10)
GFR calc non Af Amer: 41 mL/min — ABNORMAL LOW (ref >90)
GFR, EST AFRICAN AMERICAN: 48 mL/min — AB (ref >90)
Glucose, Bld: 249 mg/dL — ABNORMAL HIGH (ref 70–99)
Potassium: 4.6 mmol/L (ref 3.5–5.1)
Sodium: 139 mmol/L (ref 135–145)
Total Bilirubin: 0.5 mg/dL (ref 0.3–1.2)
Total Protein: 6.6 g/dL (ref 6.0–8.3)

## 2014-10-28 LAB — URINE MICROSCOPIC-ADD ON

## 2014-10-28 LAB — D-DIMER, QUANTITATIVE (NOT AT ARMC): D DIMER QUANT: 0.45 ug{FEU}/mL (ref 0.00–0.48)

## 2014-10-28 MED ORDER — ONDANSETRON 4 MG PO TBDP
4.0000 mg | ORAL_TABLET | Freq: Once | ORAL | Status: AC
  Administered 2014-10-28: 4 mg via ORAL
  Filled 2014-10-28: qty 1

## 2014-10-28 MED ORDER — OXYCODONE-ACETAMINOPHEN 5-325 MG PO TABS
1.0000 | ORAL_TABLET | Freq: Once | ORAL | Status: AC
  Administered 2014-10-28: 1 via ORAL
  Filled 2014-10-28: qty 1

## 2014-10-28 MED ORDER — HYDROMORPHONE HCL 1 MG/ML IJ SOLN
1.0000 mg | Freq: Once | INTRAMUSCULAR | Status: DC
  Filled 2014-10-28: qty 1

## 2014-10-28 MED ORDER — OXYCODONE-ACETAMINOPHEN 5-325 MG PO TABS: 2.0000 | ORAL_TABLET | ORAL | Status: DC | PRN

## 2014-10-28 MED ORDER — HYDROMORPHONE HCL 1 MG/ML IJ SOLN
1.0000 mg | Freq: Once | INTRAMUSCULAR | Status: AC
  Administered 2014-10-28: 1 mg via INTRAMUSCULAR

## 2014-10-28 MED ORDER — ONDANSETRON HCL 4 MG PO TABS: 4.0000 mg | ORAL_TABLET | Freq: Four times a day (QID) | ORAL | Status: DC

## 2014-10-28 NOTE — Discharge Instructions (Signed)
Follow up in the office tomorrow as scheduled. Return here for worsening symptoms. Do not take the narcotic if driving as it will make you sleepy.

## 2014-10-28 NOTE — ED Notes (Signed)
Pt reports bilateral leg pain from the thighs down. Pt has swelling noted in bilateral ankles. Pt dx with gout at [facility], given rx for percocet and prednisone. Pt states she has finished medications

## 2014-10-28 NOTE — ED Provider Notes (Signed)
CSN: [id number]     Arrival date & time 10/28/14  0905 History   First MD Initiated Contact with Patient 10/28/14 0935     Chief Complaint  Patient presents with  . Gout     (Consider location/radiation/quality/duration/timing/severity/associated sxs/prior Treatment) The history is provided by the patient.   Summer Cook is a 49 y.o. female who presents to the ED with bilateral leg pain that starts at the mid thigh and goes to her toes. She reports that her lower legs have doubled in size over night. She doubled up on her lasix but continues to have the swelling and sever pain. She also complains of shortness of breath but states that is not new. Was evaluated 10/25/14 for shortness of breath and bilateral lower extremity edema with full cardiac work up. She is concerned that her legs are painful and continue to swell.   Past Medical History  Diagnosis Date  . Essential hypertension, benign   . 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  Last was Prednisone 40mg ; 2 wk in 10/2012.  She also was on Prednisone bridged with Cellcept briefly but stopped due to lack of insurance.   . Depression   . Thrush, oral 05/29/2011  . Steroid-induced diabetes   . Pulmonary embolism 04/2012  . Asthma   . Renal insufficiency   . Chronic chest pain   . Normal cardiac stress test 02/2014   Past Surgical History  Procedure Laterality Date  . Cholecystectomy  2008  . Bone marrow biopsy    . 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 Colonoscopy with esophagogastroduodenoscopy (egd) N/A 01/04/2013    Dr. 03/06/2013: esophageal plaques with +KOH, hh. Gastric antrum abnormal, bx reactive gastropathy. Anal canal hemorrhoids, colonic tics, normal TI, single polyp (sessile serrated tubular adenoma). Next TCS 12/2017  . Cataract extraction     Family History  Problem Relation Age of Onset  . Cervical cancer Mother   .  Lung cancer Father   . Pneumonia Brother   . Cancer Paternal Grandmother 70    colon cancer, breast cancer  . Cancer Paternal Grandfather 57    colon cancer  . AAA (abdominal aortic aneurysm) Paternal Grandmother    History  Substance Use Topics  . Smoking status: Former Smoker -- 0 years    Types: Cigarettes    Quit date: 02/14/2009  . Smokeless tobacco: Never Used  . Alcohol Use: No   OB History    No data available     Review of Systems Negative except as stated in HPI   Allergies  Brintellix and Yellow jacket venom  Home Medications   Prior to Admission medications   Medication Sig Start Date End Date Taking? Authorizing Provider  calcitonin, salmon, (MIACALCIN/FORTICAL) 200 UNIT/ACT nasal spray Place 1 spray into alternate nostrils daily.   Yes Historical Provider, MD  dexamethasone (DECADRON) 0.5 MG tablet Take 0.5 mg by mouth 2 (two) times daily.    Yes Historical Provider, MD  furosemide (LASIX) 20 MG tablet Take 1 tablet (20 mg total) by mouth daily. Restart in 3 days 04/29/14  Yes 06/29/14, MD  insulin aspart (NOVOLOG) 100 UNIT/ML injection Inject 2-5 Units into the skin daily as needed for high blood sugar. Per sliding scale.   Yes Historical Provider, MD  ipratropium-albuterol (DUONEB) 0.5-2.5 (3) MG/3ML SOLN Take 3 mLs by nebulization 3 (three) times daily as needed (shorntess of breath/wheezing).  03/22/13  Yes Historical Provider, MD  metoprolol (LOPRESSOR) 50 MG tablet Take 50 mg by mouth 2 (two) times daily. 06/23/14  Yes Historical Provider, MD  mometasone (NASONEX) 50 MCG/ACT nasal spray Place 2 sprays into the nose daily.   Yes Historical Provider, MD  Multiple Vitamin (MULTIVITAMIN WITH MINERALS) TABS tablet Take 1 tablet by mouth daily.   Yes Historical Provider, MD  vitamin B-12 (CYANOCOBALAMIN) 1000 MCG tablet Take 1,000 mcg by mouth daily.   Yes Historical Provider, MD  albuterol (PROVENTIL HFA;VENTOLIN HFA) 108 (90 BASE) MCG/ACT inhaler Inhale 2  puffs into the lungs every 6 (six) hours as needed for wheezing or shortness of breath.     Historical Provider, MD  ALPRAZolam Duanne Moron) 1 MG tablet Take 1 mg by mouth 3 (three) times daily as needed for anxiety.     Historical Provider, MD  ARIPiprazole (ABILIFY) 2 MG tablet Take 2 mg by mouth daily. 09/27/14   Historical Provider, MD  HYDROcodone-acetaminophen (NORCO/VICODIN) 5-325 MG per tablet Take one-two tabs po q 4-6 hrs prn pain Patient not taking: Reported on 10/28/2014 10/22/14   Tammy L. Triplett, PA-C  metoprolol succinate (TOPROL-XL) 50 MG 24 hr tablet TAKE 1 TABLET BY MOUTH EVERY DAY Patient not taking: Reported on 10/28/2014 10/23/14   Satira Sark, MD  montelukast (SINGULAIR) 10 MG tablet Take 10 mg by mouth daily.    Historical Provider, MD  ondansetron (ZOFRAN) 4 MG tablet Take 1 tablet (4 mg total) by mouth every 6 (six) hours. 10/28/14   Hope Bunnie Pion, NP  oxyCODONE-acetaminophen (PERCOCET/ROXICET) 5-325 MG per tablet Take 2 tablets by mouth every 4 (four) hours as needed for moderate pain or severe pain. 10/28/14   Hope Bunnie Pion, NP  potassium chloride SA (K-DUR,KLOR-CON) 20 MEQ tablet Take 1 tablet (20 mEq total) by mouth daily. Patient not taking: Reported on 10/28/2014 10/22/14   Baird Cancer, PA-C  sertraline (ZOLOFT) 50 MG tablet Take 150 mg by mouth daily.     Historical Provider, MD   BP 143/100 mmHg  Pulse 80  Temp(Src) 98 F (36.7 C) (Oral)  Resp 16  Ht $R'5\' 5"'uN$  (1.651 m)  Wt 225 lb (102.059 kg)  BMI 37.44 kg/m2  SpO2 97%  LMP 05/13/2011 Physical Exam  Constitutional: She is oriented to person, place, and time. She appears well-developed and well-nourished. No distress.  HENT:  Head: Normocephalic.  Eyes: Conjunctivae and EOM are normal.  Neck: Trachea normal and normal range of motion. Neck supple. Normal carotid pulses and no JVD present. Carotid bruit is not present.  Cardiovascular: Normal rate and regular rhythm.   Elevated BP  Pulmonary/Chest: Effort  normal. No respiratory distress. She has no wheezes. She has no rales.  Abdominal: Soft. Bowel sounds are normal. There is generalized tenderness.  Tenderness is mild with deep palpation  Musculoskeletal: She exhibits edema.  1+ edema bilateral lower extremities. Patient complains of pain with palpation. Adequate circulation.  Neurological: She is alert and oriented to person, place, and time. No cranial nerve deficit.  Skin: Skin is warm and dry.  Psychiatric: She has a normal mood and affect. Her behavior is normal.  Nursing note and vitals reviewed.   ED Course  Procedures (including critical care time) Labs, EKG, pain management, medication for nausea Labs Review Results for orders placed or performed during the hospital encounter of 10/28/14 (from the past 24 hour(s))  D-dimer, quantitative     Status: None   Collection Time: 10/28/14 10:08 AM  Result Value  Ref Range   D-Dimer, Quant 0.45 0.00 - 0.48 ug/mL-FEU  CBC with Differential     Status: Abnormal   Collection Time: 10/28/14 10:08 AM  Result Value Ref Range   WBC 13.3 (H) 4.0 - 10.5 K/uL   RBC 5.13 (H) 3.87 - 5.11 MIL/uL   Hemoglobin 14.4 12.0 - 15.0 g/dL   HCT 72.2 77.3 - 75.0 %   MCV 88.5 78.0 - 100.0 fL   MCH 28.1 26.0 - 34.0 pg   MCHC 31.7 30.0 - 36.0 g/dL   RDW 51.0 (H) 71.2 - 52.4 %   Platelets 186 150 - 400 K/uL   Neutrophils Relative % 75 43 - 77 %   Neutro Abs 10.0 (H) 1.7 - 7.7 K/uL   Lymphocytes Relative 16 12 - 46 %   Lymphs Abs 2.1 0.7 - 4.0 K/uL   Monocytes Relative 9 3 - 12 %   Monocytes Absolute 1.2 (H) 0.1 - 1.0 K/uL   Eosinophils Relative 0 0 - 5 %   Eosinophils Absolute 0.0 0.0 - 0.7 K/uL   Basophils Relative 0 0 - 1 %   Basophils Absolute 0.0 0.0 - 0.1 K/uL  Comprehensive metabolic panel     Status: Abnormal   Collection Time: 10/28/14 10:08 AM  Result Value Ref Range   Sodium 139 135 - 145 mmol/L   Potassium 4.6 3.5 - 5.1 mmol/L   Chloride 102 96 - 112 mmol/L   CO2 28 19 - 32 mmol/L    Glucose, Bld 249 (H) 70 - 99 mg/dL   BUN 35 (H) 6 - 23 mg/dL   Creatinine, Ser 7.99 (H) 0.50 - 1.10 mg/dL   Calcium 9.3 8.4 - 80.0 mg/dL   Total Protein 6.6 6.0 - 8.3 g/dL   Albumin 3.5 3.5 - 5.2 g/dL   AST 20 0 - 37 U/L   ALT 34 0 - 35 U/L   Alkaline Phosphatase 98 39 - 117 U/L   Total Bilirubin 0.5 0.3 - 1.2 mg/dL   GFR calc non Af Amer 41 (L) >90 mL/min   GFR calc Af Amer 48 (L) >90 mL/min   Anion gap 9 5 - 15  Urinalysis, Routine w reflex microscopic     Status: Abnormal   Collection Time: 10/28/14 11:15 AM  Result Value Ref Range   Color, Urine YELLOW YELLOW   APPearance HAZY (A) CLEAR   Specific Gravity, Urine 1.015 1.005 - 1.030   pH 6.5 5.0 - 8.0   Glucose, UA 100 (A) NEGATIVE mg/dL   Hgb urine dipstick SMALL (A) NEGATIVE   Bilirubin Urine NEGATIVE NEGATIVE   Ketones, ur NEGATIVE NEGATIVE mg/dL   Protein, ur TRACE (A) NEGATIVE mg/dL   Urobilinogen, UA 0.2 0.0 - 1.0 mg/dL   Nitrite NEGATIVE NEGATIVE   Leukocytes, UA SMALL (A) NEGATIVE  Urine microscopic-add on     Status: Abnormal   Collection Time: 10/28/14 11:15 AM  Result Value Ref Range   Squamous Epithelial / LPF MANY (A) RARE   WBC, UA 11-20 <3 WBC/hpf   Bacteria, UA MANY (A) RARE    Imaging Review No results found. EKG Interpretation  Date/Time:  Sunday October 28 2014 10:07:30 EST Ventricular Rate:  84 PR Interval:  166 QRS Duration: 83 QT Interval:  379 QTC Calculation: 448 R Axis:   95 Text Interpretation:  Sinus rhythm Anterior infarct, old Confirmed by Homer Miller  MD, Decarla Siemen ([id number]) on 10/28/2014 10:21:53 AM  MDM  49 y.o. female with bilateral lower leg pain  and edema onset last night. No improvement with doubling her lasix. Stable for discharge without neurovascular deficit pain has improved and no nausea at this time. She is to follow up with [name] tomorrow in the office. Will give pain medication and medication for nausea to take home at discharge. Urine sent for culture.  Final diagnoses:   Bilateral lower extremity edema  Lower extremity pain, bilateral      Ashley Murrain, NP 10/28/14 1715  I personally performed the services described in this documentation, which was scribed in my presence. The recorded information has been reviewed and is accurate. Patient has obvious edema of her bilateral lower extremities. This is been a chronic problem. She is told her Lasix with minimal relief. Neurovascular intact. She has primary care follow-up. Urine culture pending.  [name], MD 10/30/14 [phone]

## 2014-10-29 DIAGNOSIS — M1000 Idiopathic gout, unspecified site: Secondary | ICD-10-CM | POA: Diagnosis not present

## 2014-10-29 DIAGNOSIS — R600 Localized edema: Secondary | ICD-10-CM | POA: Diagnosis not present

## 2014-10-29 DIAGNOSIS — Z6837 Body mass index (BMI) 37.0-37.9, adult: Secondary | ICD-10-CM | POA: Diagnosis not present

## 2014-10-29 LAB — URINE CULTURE: Colony Count: 15000

## 2014-10-31 MED FILL — Ondansetron HCl Tab 4 MG: ORAL | Qty: 4 | Status: AC

## 2014-10-31 MED FILL — Oxycodone w/ Acetaminophen Tab 5-325 MG: ORAL | Qty: 6 | Status: AC

## 2014-11-01 ENCOUNTER — Other Ambulatory Visit (HOSPITAL_COMMUNITY): Payer: Medicare Other

## 2014-11-12 ENCOUNTER — Ambulatory Visit (HOSPITAL_COMMUNITY): Payer: Medicare Other | Admitting: Hematology & Oncology

## 2014-11-27 ENCOUNTER — Ambulatory Visit (HOSPITAL_COMMUNITY): Payer: Medicare Other | Admitting: Hematology & Oncology

## 2014-11-28 ENCOUNTER — Encounter (HOSPITAL_COMMUNITY): Payer: Medicare Other | Attending: Hematology & Oncology | Admitting: Hematology & Oncology

## 2014-11-28 ENCOUNTER — Encounter (HOSPITAL_COMMUNITY): Payer: Self-pay | Admitting: Hematology & Oncology

## 2014-11-28 ENCOUNTER — Encounter (HOSPITAL_BASED_OUTPATIENT_CLINIC_OR_DEPARTMENT_OTHER): Payer: Medicare Other

## 2014-11-28 VITALS — BP 102/76 | HR 68 | Temp 98.1°F | Resp 18 | Wt 238.8 lb

## 2014-11-28 DIAGNOSIS — D509 Iron deficiency anemia, unspecified: Secondary | ICD-10-CM

## 2014-11-28 DIAGNOSIS — M2550 Pain in unspecified joint: Secondary | ICD-10-CM | POA: Diagnosis not present

## 2014-11-28 DIAGNOSIS — N289 Disorder of kidney and ureter, unspecified: Secondary | ICD-10-CM | POA: Diagnosis not present

## 2014-11-28 DIAGNOSIS — D693 Immune thrombocytopenic purpura: Secondary | ICD-10-CM | POA: Insufficient documentation

## 2014-11-28 DIAGNOSIS — Z139 Encounter for screening, unspecified: Secondary | ICD-10-CM

## 2014-11-28 DIAGNOSIS — E876 Hypokalemia: Secondary | ICD-10-CM | POA: Insufficient documentation

## 2014-11-28 LAB — COMPREHENSIVE METABOLIC PANEL
ALT: 43 U/L — AB (ref 0–35)
AST: 29 U/L (ref 0–37)
Albumin: 3.6 g/dL (ref 3.5–5.2)
Alkaline Phosphatase: 84 U/L (ref 39–117)
Anion gap: 9 (ref 5–15)
BUN: 27 mg/dL — ABNORMAL HIGH (ref 6–23)
CO2: 29 mmol/L (ref 19–32)
CREATININE: 1.52 mg/dL — AB (ref 0.50–1.10)
Calcium: 8.9 mg/dL (ref 8.4–10.5)
Chloride: 103 mmol/L (ref 96–112)
GFR, EST AFRICAN AMERICAN: 46 mL/min — AB (ref >90)
GFR, EST NON AFRICAN AMERICAN: 39 mL/min — AB (ref >90)
GLUCOSE: 166 mg/dL — AB (ref 70–99)
Potassium: 4.2 mmol/L (ref 3.5–5.1)
SODIUM: 141 mmol/L (ref 135–145)
TOTAL PROTEIN: 6.6 g/dL (ref 6.0–8.3)
Total Bilirubin: 0.4 mg/dL (ref 0.3–1.2)

## 2014-11-28 LAB — CBC WITH DIFFERENTIAL/PLATELET
BASOS ABS: 0.1 10*3/uL (ref 0.0–0.1)
Basophils Relative: 1 % (ref 0–1)
Eosinophils Absolute: 0.1 10*3/uL (ref 0.0–0.7)
Eosinophils Relative: 1 % (ref 0–5)
HCT: 46.9 % — ABNORMAL HIGH (ref 36.0–46.0)
Hemoglobin: 14.9 g/dL (ref 12.0–15.0)
LYMPHS ABS: 2.2 10*3/uL (ref 0.7–4.0)
LYMPHS PCT: 29 % (ref 12–46)
MCH: 28.6 pg (ref 26.0–34.0)
MCHC: 31.8 g/dL (ref 30.0–36.0)
MCV: 90 fL (ref 78.0–100.0)
MONO ABS: 0.8 10*3/uL (ref 0.1–1.0)
Monocytes Relative: 10 % (ref 3–12)
NEUTROS PCT: 60 % (ref 43–77)
Neutro Abs: 4.6 10*3/uL (ref 1.7–7.7)
PLATELETS: 152 10*3/uL (ref 150–400)
RBC: 5.21 MIL/uL — AB (ref 3.87–5.11)
RDW: 16 % — ABNORMAL HIGH (ref 11.5–15.5)
WBC: 7.6 10*3/uL (ref 4.0–10.5)

## 2014-11-28 LAB — IRON AND TIBC
IRON: 62 ug/dL (ref 42–145)
SATURATION RATIOS: 16 % — AB (ref 20–55)
TIBC: 400 ug/dL (ref 250–470)
UIBC: 338 ug/dL (ref 125–400)

## 2014-11-28 LAB — FERRITIN: FERRITIN: 58 ng/mL (ref 10–291)

## 2014-11-28 NOTE — Progress Notes (Signed)
LABS FOR IRON/TIBC,FERR,CBCD,CMP

## 2014-11-28 NOTE — Patient Instructions (Signed)
[place] at [facility] Discharge Instructions  RECOMMENDATIONS MADE BY THE CONSULTANT AND ANY TEST RESULTS WILL BE SENT TO YOUR REFERRING PHYSICIAN.  Taper your Dexamethasone as discussed  Return in 1 week for CBC  Return in 1 month to see Dr. Whitney Muse    Thank you for choosing [place] at [facility] to provide your oncology and hematology care.  To afford each patient quality time with our provider, please arrive at least 15 minutes before your scheduled appointment time.    You need to re-schedule your appointment should you arrive 10 or more minutes late.  We strive to give you quality time with our providers, and arriving late affects you and other patients whose appointments are after yours.  Also, if you no show three or more times for appointments you may be dismissed from the clinic at the providers discretion.     Again, thank you for choosing [facility].  Our hope is that these requests will decrease the amount of time that you wait before being seen by our physicians.       _____________________________________________________________  Should you have questions after your visit to [facility], please contact our office at [phone] between the hours of 8:30 a.m. and 4:30 p.m.  Voicemails left after 4:30 p.m. will not be returned until the following business day.  For prescription refill requests, have your pharmacy contact our office.

## 2014-11-28 NOTE — Progress Notes (Signed)
Summer Cook., MD [address] [place] [address]    DIAGNOSIS:  ITP with platelet count of 18K on 12/2012 diagnosed in 2011   Splenectomy   History of iron deficiency   DEXA with normal bone density 05/2014   CURRENT THERAPY: dexamethasone 0.5 mg bid  INTERVAL HISTORY: Summer Cook 49 y.o. female returns for follow-up of ITP. She recently was diagnosed with gout and states it was the worst pain she has ever had. She was given a prescription for colchicine. She denies any bleeding or bruising. She states she does not like taking dexamethasone chronically. Even though it is a small dose she finds it still gives her an increased appetite. She reports feeling very fatigued. She notices that even after a night of sleep she feels tired in the morning. She has not had a sleep study. She has not had a screening mammogram.  MEDICAL HISTORY: Past Medical History  Diagnosis Date  . Essential hypertension, benign   . 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  Last was Prednisone 40mg ; 2 wk in 10/2012.  She also was on Prednisone bridged with Cellcept briefly but stopped due to lack of insurance.   . Depression   . Thrush, oral 05/29/2011  . Steroid-induced diabetes   . Pulmonary embolism 04/2012  . Asthma   . Renal insufficiency   . Chronic chest pain   . Normal cardiac stress test 02/2014  . Gout     has Hypertension; ITP (idiopathic thrombocytopenic purpura); Tachycardia; Hypokalemia; Hemorrhoids; Drug-induced hyperglycemia; Post-splenectomy; Immunosuppressed status; Depression; Obesity; Headache; Renal insufficiency; PE (pulmonary embolism); Rectal bleeding; Abdominal pain, epigastric; Melena; Chronic anticoagulation; Unspecified constipation; Nausea with vomiting; Diarrhea; Abnormal LFTs; Hyponatremia; Orthostatic hypotension; Chest pain; Dehydration; Acute renal  failure; and CKD (chronic kidney disease) stage 3, GFR 30-59 ml/min on her problem list.     is allergic to brintellix and yellow jacket venom.  Summer Cook had no medications administered during this visit.  SURGICAL HISTORY: Past Surgical History  Procedure Laterality Date  . Cholecystectomy  2008  . Bone marrow biopsy    . 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 Colonoscopy with esophagogastroduodenoscopy (egd) N/A 01/04/2013    Dr. 03/06/2013: esophageal plaques with +KOH, hh. Gastric antrum abnormal, bx reactive gastropathy. Anal canal hemorrhoids, colonic tics, normal TI, single polyp (sessile serrated tubular adenoma). Next TCS 12/2017  . Cataract extraction      SOCIAL HISTORY: History   Social History  . Marital Status: Divorced    Spouse Name: N/A  . Number of Children: 1  . Years of Education: N/A   Occupational History  .      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    Social History Main Topics  . Smoking status: Former Smoker -- 0 years    Types: Cigarettes    Quit date: 02/14/2009  . Smokeless tobacco: Never Used  . Alcohol Use: No  . Drug Use: No  . Sexual Activity: No   Other Topics Concern  . Not on file   Social History Narrative    FAMILY HISTORY: Family History  Problem Relation Age of Onset  . Cervical cancer Mother   . Lung cancer Father   . Pneumonia Brother   . Cancer Paternal Grandmother 47    colon cancer, breast cancer  . Cancer Paternal Grandfather 64    colon cancer  . AAA (abdominal aortic aneurysm) Paternal  Grandmother     Review of Systems  Constitutional: Negative.   HENT: Negative.   Eyes: Negative.   Respiratory: Negative.   Cardiovascular: Negative.   Gastrointestinal: Positive for melena.  Musculoskeletal: Positive for joint pain.  Skin: Negative.   Neurological: Negative.   Endo/Heme/Allergies: Negative.   Psychiatric/Behavioral: Negative.   See HPI  PHYSICAL EXAMINATION  ECOG PERFORMANCE STATUS: 0 - Asymptomatic  Filed Vitals:    11/28/14 1202  BP: 102/76  Pulse: 68  Temp: 98.1 F (36.7 C)  Resp: 18    Physical Exam  Constitutional: She is oriented to person, place, and time and well-developed, well-nourished, and in no distress.  HENT:  Head: Normocephalic and atraumatic.  Nose: Nose normal.  Mouth/Throat: Oropharynx is clear and moist. No oropharyngeal exudate.  Eyes: Conjunctivae and EOM are normal. Pupils are equal, round, and reactive to light. Right eye exhibits no discharge. Left eye exhibits no discharge. No scleral icterus.  Neck: Normal range of motion. Neck supple. No tracheal deviation present. No thyromegaly present.  Cardiovascular: Normal rate, regular rhythm and normal heart sounds.  Exam reveals no gallop and no friction rub.   No murmur heard. Pulmonary/Chest: Effort normal and breath sounds normal. She has no wheezes. She has no rales.  Abdominal: Soft. Bowel sounds are normal. She exhibits no distension and no mass. There is no tenderness. There is no rebound and no guarding.  Musculoskeletal: Normal range of motion. She exhibits no edema.  Lymphadenopathy:    She has no cervical adenopathy.  Neurological: She is alert and oriented to person, place, and time. She has normal reflexes. No cranial nerve deficit. Gait normal. Coordination normal.  Skin: Skin is warm and dry. No rash noted.  Psychiatric: Mood, memory, affect and judgment normal.  Nursing note and vitals reviewed.   LABORATORY DATA:  CBC    Component Value Date/Time   WBC 7.6 11/28/2014 1106   WBC 7.0 02/02/2013 0840   RBC 5.21* 11/28/2014 1106   RBC 4.68 02/02/2013 0840   HGB 14.9 11/28/2014 1106   HGB 11.1* 02/02/2013 0840   HCT 46.9* 11/28/2014 1106   HCT 34.8 02/02/2013 0840   PLT 152 11/28/2014 1106   PLT 336 02/02/2013 0840   MCV 90.0 11/28/2014 1106   MCV 74.3* 02/02/2013 0840   MCH 28.6 11/28/2014 1106   MCH 23.7* 02/02/2013 0840   MCHC 31.8 11/28/2014 1106   MCHC 31.9 02/02/2013 0840   RDW 16.0*  11/28/2014 1106   RDW 21.1* 02/02/2013 0840   LYMPHSABS 2.2 11/28/2014 1106   LYMPHSABS 0.8* 02/02/2013 0840   MONOABS 0.8 11/28/2014 1106   MONOABS 0.3 02/02/2013 0840   EOSABS 0.1 11/28/2014 1106   EOSABS 0.0 02/02/2013 0840   BASOSABS 0.1 11/28/2014 1106   BASOSABS 0.0 02/02/2013 0840   CMP     Component Value Date/Time   NA 141 11/28/2014 1106   NA 140 02/02/2013 0840   K 4.2 11/28/2014 1106   K 3.9 02/02/2013 0840   CL 103 11/28/2014 1106   CL 97* 02/02/2013 0840   CO2 29 11/28/2014 1106   CO2 30* 02/02/2013 0840   GLUCOSE 166* 11/28/2014 1106   GLUCOSE 241* 02/02/2013 0840   BUN 27* 11/28/2014 1106   BUN 25.7 02/02/2013 0840   CREATININE 1.52* 11/28/2014 1106   CREATININE 1.4* 02/02/2013 0840   CALCIUM 8.9 11/28/2014 1106   CALCIUM 8.7 02/02/2013 0840   PROT 6.6 11/28/2014 1106   PROT 5.9* 02/02/2013 0840   ALBUMIN 3.6  11/28/2014 1106   ALBUMIN 3.1* 02/02/2013 0840   AST 29 11/28/2014 1106   AST 23 02/02/2013 0840   ALT 43* 11/28/2014 1106   ALT 67* 02/02/2013 0840   ALKPHOS 84 11/28/2014 1106   ALKPHOS 60 02/02/2013 0840   BILITOT 0.4 11/28/2014 1106   BILITOT 0.68 02/02/2013 0840   GFRNONAA 39* 11/28/2014 1106   GFRAA 46* 11/28/2014 1106       ASSESSMENT and THERAPY PLAN:   ITP  49 year old female with ITP diagnosed in 2011. She felt splenectomy. She is very responsive to steroids but quickly relapsed after discontinuing them. She is currently on dexamethasone 0.5 mg twice daily. It is a low dose but she finds it increases her appetite. She has never tried any of the newer medications for ITP. I discussed with her that a reasonable platelet count is typically considered greater than 60,000. It would not be unreasonable to discontinue her dexamethasone as a taper and follow her platelet counts. I advised her that if she maintains reasonable platelet counts off the steroids then we can proceed just with observation. She is very open to this. We will plan on  tapering her dexamethasone and following her CBCs. I will see her back in one month.  Screening  She has not had a mammogram and is agreeable to letting us schedule one for her.  All questions were answered. The patient knows to call the clinic with any problems, questions or concerns. We can certainly see the patient much sooner if necessary. This note was electronically signed. Molli Hazard 11/28/2014

## 2014-12-05 ENCOUNTER — Ambulatory Visit (HOSPITAL_COMMUNITY)
Admission: RE | Admit: 2014-12-05 | Discharge: 2014-12-05 | Disposition: A | Discharge status: Home / Self Care | Payer: Medicare Other | Source: Ambulatory Visit | Attending: Hematology & Oncology | Admitting: Hematology & Oncology

## 2014-12-05 ENCOUNTER — Encounter (HOSPITAL_COMMUNITY): Payer: Medicare Other

## 2014-12-05 DIAGNOSIS — E876 Hypokalemia: Secondary | ICD-10-CM | POA: Diagnosis not present

## 2014-12-05 DIAGNOSIS — Z1231 Encounter for screening mammogram for malignant neoplasm of breast: Secondary | ICD-10-CM | POA: Diagnosis not present

## 2014-12-05 DIAGNOSIS — Z139 Encounter for screening, unspecified: Secondary | ICD-10-CM

## 2014-12-05 DIAGNOSIS — D693 Immune thrombocytopenic purpura: Secondary | ICD-10-CM | POA: Diagnosis not present

## 2014-12-05 DIAGNOSIS — N289 Disorder of kidney and ureter, unspecified: Secondary | ICD-10-CM | POA: Diagnosis not present

## 2014-12-05 LAB — CBC WITH DIFFERENTIAL/PLATELET
Basophils Absolute: 0.1 10*3/uL (ref 0.0–0.1)
Basophils Relative: 1 % (ref 0–1)
Eosinophils Absolute: 0 10*3/uL (ref 0.0–0.7)
Eosinophils Relative: 1 % (ref 0–5)
HEMATOCRIT: 48 % — AB (ref 36.0–46.0)
Hemoglobin: 15.4 g/dL — ABNORMAL HIGH (ref 12.0–15.0)
LYMPHS ABS: 2 10*3/uL (ref 0.7–4.0)
Lymphocytes Relative: 22 % (ref 12–46)
MCH: 28.8 pg (ref 26.0–34.0)
MCHC: 32.1 g/dL (ref 30.0–36.0)
MCV: 89.7 fL (ref 78.0–100.0)
Monocytes Absolute: 0.6 10*3/uL (ref 0.1–1.0)
Monocytes Relative: 7 % (ref 3–12)
NEUTROS PCT: 69 % (ref 43–77)
Neutro Abs: 6 10*3/uL (ref 1.7–7.7)
Platelets: 114 10*3/uL — ABNORMAL LOW (ref 150–400)
RBC: 5.35 MIL/uL — ABNORMAL HIGH (ref 3.87–5.11)
RDW: 15.5 % (ref 11.5–15.5)
WBC: 8.7 10*3/uL (ref 4.0–10.5)

## 2014-12-06 ENCOUNTER — Telehealth (HOSPITAL_COMMUNITY): Payer: Self-pay | Admitting: Emergency Medicine

## 2014-12-06 NOTE — Telephone Encounter (Signed)
Pt called and wanted to know if she should come in before her scheduled appt.  Told her to call if she saw any symptoms that her platelets were low.  Keep taking her steroids as prescribed.

## 2014-12-06 NOTE — Telephone Encounter (Signed)
-----   Message from Louis Meckel sent at 12/06/2014 10:37 AM EST ----- Regarding: Has questions Patient would like to take to a nurse about her steroid med.  She has some questions about it.  Thanks

## 2014-12-13 ENCOUNTER — Other Ambulatory Visit (HOSPITAL_COMMUNITY): Payer: Medicare Other

## 2014-12-14 DIAGNOSIS — G894 Chronic pain syndrome: Secondary | ICD-10-CM | POA: Diagnosis not present

## 2014-12-14 DIAGNOSIS — R197 Diarrhea, unspecified: Secondary | ICD-10-CM | POA: Diagnosis not present

## 2014-12-14 DIAGNOSIS — E6609 Other obesity due to excess calories: Secondary | ICD-10-CM | POA: Diagnosis not present

## 2014-12-14 DIAGNOSIS — Z6836 Body mass index (BMI) 36.0-36.9, adult: Secondary | ICD-10-CM | POA: Diagnosis not present

## 2014-12-18 ENCOUNTER — Encounter (HOSPITAL_COMMUNITY): Payer: Self-pay | Admitting: Hematology & Oncology

## 2015-01-02 ENCOUNTER — Other Ambulatory Visit (HOSPITAL_COMMUNITY): Payer: Medicare Other

## 2015-01-02 ENCOUNTER — Ambulatory Visit (HOSPITAL_COMMUNITY): Payer: Medicare Other | Admitting: Hematology & Oncology

## 2015-01-11 ENCOUNTER — Encounter (HOSPITAL_COMMUNITY): Payer: Self-pay | Admitting: *Deleted

## 2015-01-11 ENCOUNTER — Emergency Department (HOSPITAL_COMMUNITY)
Admission: EM | Admit: 2015-01-11 | Discharge: 2015-01-11 | Disposition: A | Discharge status: Home / Self Care | Payer: Medicare Other | Attending: Emergency Medicine | Admitting: Emergency Medicine

## 2015-01-11 DIAGNOSIS — Z87891 Personal history of nicotine dependence: Secondary | ICD-10-CM | POA: Diagnosis not present

## 2015-01-11 DIAGNOSIS — F329 Major depressive disorder, single episode, unspecified: Secondary | ICD-10-CM | POA: Diagnosis not present

## 2015-01-11 DIAGNOSIS — Z8739 Personal history of other diseases of the musculoskeletal system and connective tissue: Secondary | ICD-10-CM | POA: Insufficient documentation

## 2015-01-11 DIAGNOSIS — T380X5A Adverse effect of glucocorticoids and synthetic analogues, initial encounter: Secondary | ICD-10-CM | POA: Diagnosis not present

## 2015-01-11 DIAGNOSIS — Z79899 Other long term (current) drug therapy: Secondary | ICD-10-CM | POA: Diagnosis not present

## 2015-01-11 DIAGNOSIS — Z794 Long term (current) use of insulin: Secondary | ICD-10-CM | POA: Diagnosis not present

## 2015-01-11 DIAGNOSIS — R197 Diarrhea, unspecified: Secondary | ICD-10-CM | POA: Insufficient documentation

## 2015-01-11 DIAGNOSIS — Z86711 Personal history of pulmonary embolism: Secondary | ICD-10-CM | POA: Diagnosis not present

## 2015-01-11 DIAGNOSIS — Z8619 Personal history of other infectious and parasitic diseases: Secondary | ICD-10-CM | POA: Diagnosis not present

## 2015-01-11 DIAGNOSIS — J45909 Unspecified asthma, uncomplicated: Secondary | ICD-10-CM | POA: Insufficient documentation

## 2015-01-11 DIAGNOSIS — Z7951 Long term (current) use of inhaled steroids: Secondary | ICD-10-CM | POA: Insufficient documentation

## 2015-01-11 DIAGNOSIS — Z862 Personal history of diseases of the blood and blood-forming organs and certain disorders involving the immune mechanism: Secondary | ICD-10-CM | POA: Insufficient documentation

## 2015-01-11 DIAGNOSIS — E099 Drug or chemical induced diabetes mellitus without complications: Secondary | ICD-10-CM | POA: Diagnosis not present

## 2015-01-11 DIAGNOSIS — I10 Essential (primary) hypertension: Secondary | ICD-10-CM | POA: Insufficient documentation

## 2015-01-11 DIAGNOSIS — R000 Tachycardia, unspecified: Secondary | ICD-10-CM | POA: Diagnosis not present

## 2015-01-11 DIAGNOSIS — Z87448 Personal history of other diseases of urinary system: Secondary | ICD-10-CM | POA: Insufficient documentation

## 2015-01-11 DIAGNOSIS — G8929 Other chronic pain: Secondary | ICD-10-CM | POA: Diagnosis not present

## 2015-01-11 LAB — COMPREHENSIVE METABOLIC PANEL
ALT: 47 U/L — ABNORMAL HIGH (ref 0–35)
AST: 41 U/L — ABNORMAL HIGH (ref 0–37)
Albumin: 4 g/dL (ref 3.5–5.2)
Alkaline Phosphatase: 79 U/L (ref 39–117)
Anion gap: 11 (ref 5–15)
BUN: 12 mg/dL (ref 6–23)
CO2: 21 mmol/L (ref 19–32)
Calcium: 9.9 mg/dL (ref 8.4–10.5)
Chloride: 109 mmol/L (ref 96–112)
Creatinine, Ser: 1.28 mg/dL — ABNORMAL HIGH (ref 0.50–1.10)
GFR calc Af Amer: 56 mL/min — ABNORMAL LOW (ref >90)
GFR calc non Af Amer: 48 mL/min — ABNORMAL LOW (ref >90)
Glucose, Bld: 95 mg/dL (ref 70–99)
Potassium: 3.8 mmol/L (ref 3.5–5.1)
Sodium: 141 mmol/L (ref 135–145)
Total Bilirubin: 0.8 mg/dL (ref 0.3–1.2)
Total Protein: 7.5 g/dL (ref 6.0–8.3)

## 2015-01-11 LAB — CBC
HCT: 51.5 % — ABNORMAL HIGH (ref 36.0–46.0)
Hemoglobin: 16.6 g/dL — ABNORMAL HIGH (ref 12.0–15.0)
MCH: 28.2 pg (ref 26.0–34.0)
MCHC: 32.2 g/dL (ref 30.0–36.0)
MCV: 87.6 fL (ref 78.0–100.0)
Platelets: 130 10*3/uL — ABNORMAL LOW (ref 150–400)
RBC: 5.88 MIL/uL — ABNORMAL HIGH (ref 3.87–5.11)
RDW: 15.1 % (ref 11.5–15.5)
WBC: 9.4 10*3/uL (ref 4.0–10.5)

## 2015-01-11 MED ORDER — DIPHENOXYLATE-ATROPINE 2.5-0.025 MG PO TABS
2.0000 | ORAL_TABLET | Freq: Once | ORAL | Status: AC
  Administered 2015-01-11: 2 via ORAL
  Filled 2015-01-11: qty 2

## 2015-01-11 MED ORDER — SODIUM CHLORIDE 0.9 % IV BOLUS (SEPSIS)
1000.0000 mL | Freq: Once | INTRAVENOUS | Status: AC
  Administered 2015-01-11: 1000 mL via INTRAVENOUS

## 2015-01-11 MED ORDER — HYDROMORPHONE HCL 1 MG/ML IJ SOLN
1.0000 mg | Freq: Once | INTRAMUSCULAR | Status: AC
  Administered 2015-01-11: 1 mg via INTRAVENOUS
  Filled 2015-01-11: qty 1

## 2015-01-11 MED ORDER — MORPHINE SULFATE 4 MG/ML IJ SOLN
4.0000 mg | Freq: Once | INTRAMUSCULAR | Status: AC
  Administered 2015-01-11: 4 mg via INTRAVENOUS
  Filled 2015-01-11: qty 1

## 2015-01-11 NOTE — ED Notes (Addendum)
Pt states has had diarrhea x 10 days, denies emesis, reports nausea and SOB. Pt reports having 11 bowel movements today.

## 2015-01-11 NOTE — ED Provider Notes (Signed)
CSN: [id number]     Arrival date & time 01/11/15  1458 History   First MD Initiated Contact with Patient 01/11/15 1627     Chief Complaint  Patient presents with  . Diarrhea    10 days     (Consider location/radiation/quality/duration/timing/severity/associated sxs/prior Treatment) HPI   49 year old female with diarrhea. Symptom onset about 10 days ago. Worsening the past 2 days. Patient estimates 10-12 very loose to watery stools today. No blood. Nausea, no vomiting. No sick contacts. Denies recent antibiotic usage. Has tried taking Imodium with minimal relief. No fever. Patient reports intermittent bouts of diarrhea like this since colonoscopy March 2014. Crampy abdominal pain. Also cramps in her legs. She feels she is dehydrated.   Past Medical History  Diagnosis Date  . Essential hypertension, benign   . 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  Last was Prednisone $RemoveBefore'40mg'[id number]$ ; 2 wk in 10/2012.  She also was on Prednisone bridged with Cellcept briefly but stopped due to lack of insurance.   . Depression   . Thrush, oral 05/29/2011  . Steroid-induced diabetes   . Pulmonary embolism 04/2012  . Asthma   . Renal insufficiency   . Chronic chest pain   . Normal cardiac stress test 02/2014  . Gout    Past Surgical History  Procedure Laterality Date  . Cholecystectomy  2008  . Bone marrow biopsy    . 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 Colonoscopy with esophagogastroduodenoscopy (egd) N/A 01/04/2013    Dr. Gala Romney: esophageal plaques with +KOH, hh. Gastric antrum abnormal, bx reactive gastropathy. Anal canal hemorrhoids, colonic tics, normal TI, single polyp (sessile serrated tubular adenoma). Next TCS 12/2017  . Cataract extraction     Family History  Problem Relation Age of Onset  . Cervical cancer Mother   . Lung cancer Father   . Pneumonia Brother   . Cancer Paternal Grandmother 39   colon cancer, breast cancer  . Cancer Paternal Grandfather 37    colon cancer  . AAA (abdominal aortic aneurysm) Paternal Grandmother    History  Substance Use Topics  . Smoking status: Former Smoker -- 0 years    Types: Cigarettes    Quit date: 02/14/2009  . Smokeless tobacco: Never Used  . Alcohol Use: No   OB History    No data available     Review of Systems  All systems reviewed and negative, other than as noted in HPI.   Allergies  Brintellix and Yellow jacket venom  Home Medications   Prior to Admission medications   Medication Sig Start Date End Date Taking? Authorizing Provider  albuterol (PROVENTIL HFA;VENTOLIN HFA) 108 (90 BASE) MCG/ACT inhaler Inhale 2 puffs into the lungs every 6 (six) hours as needed for wheezing or shortness of breath.     Historical Provider, MD  ALPRAZolam Duanne Moron) 1 MG tablet Take 1 mg by mouth 3 (three) times daily as needed for anxiety.     Historical Provider, MD  ARIPiprazole (ABILIFY) 2 MG tablet Take 2 mg by mouth daily. 09/27/14   Historical Provider, MD  calcitonin, salmon, (MIACALCIN/FORTICAL) 200 UNIT/ACT nasal spray Place 1 spray into alternate nostrils daily.    Historical Provider, MD  dexamethasone (DECADRON) 0.5 MG tablet Take 0.5 mg by mouth 2 (two) times daily.     Historical Provider, MD  furosemide (LASIX) 40 MG tablet Take 40 mg by mouth daily. 1 to 2 tablets daily  Historical Provider, MD  HYDROcodone-acetaminophen (NORCO/VICODIN) 5-325 MG per tablet Take one-two tabs po q 4-6 hrs prn pain 10/22/14   Tammy Triplett, PA-C  insulin aspart (NOVOLOG) 100 UNIT/ML injection Inject 2-5 Units into the skin daily as needed for high blood sugar. Per sliding scale.    Historical Provider, MD  ipratropium-albuterol (DUONEB) 0.5-2.5 (3) MG/3ML SOLN Take 3 mLs by nebulization 3 (three) times daily as needed (shorntess of breath/wheezing).  03/22/13   Historical Provider, MD  metoprolol (LOPRESSOR) 50 MG tablet Take 50 mg by mouth 2 (two)  times daily. 06/23/14   Historical Provider, MD  mometasone (NASONEX) 50 MCG/ACT nasal spray Place 2 sprays into the nose daily.    Historical Provider, MD  montelukast (SINGULAIR) 10 MG tablet Take 10 mg by mouth daily.    Historical Provider, MD  Multiple Vitamin (MULTIVITAMIN WITH MINERALS) TABS tablet Take 1 tablet by mouth daily.    Historical Provider, MD  potassium chloride SA (K-DUR,KLOR-CON) 20 MEQ tablet Take 1 tablet (20 mEq total) by mouth daily. 10/22/14   Baird Cancer, PA-C  sertraline (ZOLOFT) 50 MG tablet Take 150 mg by mouth daily.     Historical Provider, MD  vitamin B-12 (CYANOCOBALAMIN) 1000 MCG tablet Take 1,000 mcg by mouth daily.    Historical Provider, MD   BP 118/97 mmHg  Pulse 113  Temp(Src) 97.8 F (36.6 C) (Oral)  Resp 20  Ht $R'5\' 5"'sh$  (1.651 m)  Wt 223 lb (101.152 kg)  BMI 37.11 kg/m2  SpO2 98%  LMP 05/13/2011 Physical Exam  Constitutional: She appears well-developed and well-nourished. No distress.  HENT:  Head: Normocephalic and atraumatic.  Eyes: Conjunctivae are normal. Right eye exhibits no discharge. Left eye exhibits no discharge.  Neck: Neck supple.  Cardiovascular: Regular rhythm and normal heart sounds.  Exam reveals no gallop and no friction rub.   No murmur heard. Mild tachcyardia  Pulmonary/Chest: Effort normal and breath sounds normal. No respiratory distress.  Abdominal: Soft. She exhibits no distension. There is no tenderness.  Musculoskeletal: She exhibits no edema or tenderness.  Neurological: She is alert.  Skin: Skin is warm and dry.  Psychiatric: She has a normal mood and affect. Her behavior is normal. Thought content normal.  Nursing note and vitals reviewed.   ED Course  Procedures (including critical care time) Labs Review Labs Reviewed  CBC - Abnormal; Notable for the following:    RBC 5.88 (*)    Hemoglobin 16.6 (*)    HCT 51.5 (*)    Platelets 130 (*)    All other components within normal limits  COMPREHENSIVE  METABOLIC PANEL - Abnormal; Notable for the following:    Creatinine, Ser 1.28 (*)    AST 41 (*)    ALT 47 (*)    GFR calc non Af Amer 48 (*)    GFR calc Af Amer 56 (*)    All other components within normal limits    Imaging Review No results found.   EKG Interpretation None      MDM   Final diagnoses:  Diarrhea    49yf with diarrhea. Benign abdominal exam. Tachycardia on presentation which has since resolved. Renal function actually a little better than baseline. I feel safe for DC at this time.     Virgel Manifold, MD 01/14/15 [phone]

## 2015-01-11 NOTE — ED Notes (Signed)
Pt given warm blanket.

## 2015-01-11 NOTE — Discharge Instructions (Signed)

## 2015-01-19 ENCOUNTER — Emergency Department (HOSPITAL_COMMUNITY): Payer: Medicare Other

## 2015-01-19 ENCOUNTER — Emergency Department (HOSPITAL_COMMUNITY)
Admission: EM | Admit: 2015-01-19 | Discharge: 2015-01-19 | Disposition: A | Discharge status: Home / Self Care | Payer: Medicare Other | Attending: Emergency Medicine | Admitting: Emergency Medicine

## 2015-01-19 ENCOUNTER — Encounter (HOSPITAL_COMMUNITY): Payer: Self-pay | Admitting: Emergency Medicine

## 2015-01-19 DIAGNOSIS — E099 Drug or chemical induced diabetes mellitus without complications: Secondary | ICD-10-CM | POA: Diagnosis not present

## 2015-01-19 DIAGNOSIS — Z8619 Personal history of other infectious and parasitic diseases: Secondary | ICD-10-CM | POA: Diagnosis not present

## 2015-01-19 DIAGNOSIS — Z794 Long term (current) use of insulin: Secondary | ICD-10-CM | POA: Insufficient documentation

## 2015-01-19 DIAGNOSIS — R52 Pain, unspecified: Secondary | ICD-10-CM | POA: Insufficient documentation

## 2015-01-19 DIAGNOSIS — R1084 Generalized abdominal pain: Secondary | ICD-10-CM | POA: Diagnosis not present

## 2015-01-19 DIAGNOSIS — Z79899 Other long term (current) drug therapy: Secondary | ICD-10-CM | POA: Diagnosis not present

## 2015-01-19 DIAGNOSIS — Z8739 Personal history of other diseases of the musculoskeletal system and connective tissue: Secondary | ICD-10-CM | POA: Diagnosis not present

## 2015-01-19 DIAGNOSIS — R197 Diarrhea, unspecified: Secondary | ICD-10-CM

## 2015-01-19 DIAGNOSIS — Z86711 Personal history of pulmonary embolism: Secondary | ICD-10-CM | POA: Diagnosis not present

## 2015-01-19 DIAGNOSIS — T380X5A Adverse effect of glucocorticoids and synthetic analogues, initial encounter: Secondary | ICD-10-CM | POA: Diagnosis not present

## 2015-01-19 DIAGNOSIS — E669 Obesity, unspecified: Secondary | ICD-10-CM | POA: Insufficient documentation

## 2015-01-19 DIAGNOSIS — R531 Weakness: Secondary | ICD-10-CM | POA: Diagnosis not present

## 2015-01-19 DIAGNOSIS — Z87448 Personal history of other diseases of urinary system: Secondary | ICD-10-CM | POA: Diagnosis not present

## 2015-01-19 DIAGNOSIS — J45909 Unspecified asthma, uncomplicated: Secondary | ICD-10-CM | POA: Insufficient documentation

## 2015-01-19 DIAGNOSIS — E860 Dehydration: Secondary | ICD-10-CM | POA: Diagnosis not present

## 2015-01-19 DIAGNOSIS — Z7901 Long term (current) use of anticoagulants: Secondary | ICD-10-CM | POA: Insufficient documentation

## 2015-01-19 DIAGNOSIS — F329 Major depressive disorder, single episode, unspecified: Secondary | ICD-10-CM | POA: Diagnosis not present

## 2015-01-19 DIAGNOSIS — I10 Essential (primary) hypertension: Secondary | ICD-10-CM | POA: Diagnosis not present

## 2015-01-19 DIAGNOSIS — Z87891 Personal history of nicotine dependence: Secondary | ICD-10-CM | POA: Diagnosis not present

## 2015-01-19 DIAGNOSIS — D693 Immune thrombocytopenic purpura: Secondary | ICD-10-CM | POA: Insufficient documentation

## 2015-01-19 LAB — CBC WITH DIFFERENTIAL/PLATELET
BASOS ABS: 0.1 10*3/uL (ref 0.0–0.1)
Basophils Relative: 1 % (ref 0–1)
EOS ABS: 0 10*3/uL (ref 0.0–0.7)
Eosinophils Relative: 0 % (ref 0–5)
HCT: 48.5 % — ABNORMAL HIGH (ref 36.0–46.0)
Hemoglobin: 15.5 g/dL — ABNORMAL HIGH (ref 12.0–15.0)
Lymphocytes Relative: 17 % (ref 12–46)
Lymphs Abs: 1.6 10*3/uL (ref 0.7–4.0)
MCH: 27.8 pg (ref 26.0–34.0)
MCHC: 32 g/dL (ref 30.0–36.0)
MCV: 86.9 fL (ref 78.0–100.0)
MONOS PCT: 9 % (ref 3–12)
Monocytes Absolute: 0.8 10*3/uL (ref 0.1–1.0)
NEUTROS ABS: 7 10*3/uL (ref 1.7–7.7)
Neutrophils Relative %: 74 % (ref 43–77)
PLATELETS: 129 10*3/uL — AB (ref 150–400)
RBC: 5.58 MIL/uL — AB (ref 3.87–5.11)
RDW: 15 % (ref 11.5–15.5)
WBC: 9.4 10*3/uL (ref 4.0–10.5)

## 2015-01-19 LAB — URINALYSIS, ROUTINE W REFLEX MICROSCOPIC
Bilirubin Urine: NEGATIVE
GLUCOSE, UA: NEGATIVE mg/dL
Ketones, ur: NEGATIVE mg/dL
LEUKOCYTES UA: NEGATIVE
Nitrite: NEGATIVE
Urobilinogen, UA: 0.2 mg/dL (ref 0.0–1.0)
pH: 5.5 (ref 5.0–8.0)

## 2015-01-19 LAB — COMPREHENSIVE METABOLIC PANEL
ALK PHOS: 67 U/L (ref 39–117)
ALT: 57 U/L — AB (ref 0–35)
ANION GAP: 13 (ref 5–15)
AST: 48 U/L — ABNORMAL HIGH (ref 0–37)
Albumin: 3.8 g/dL (ref 3.5–5.2)
BUN: 13 mg/dL (ref 6–23)
CHLORIDE: 109 mmol/L (ref 96–112)
CO2: 19 mmol/L (ref 19–32)
Calcium: 9.5 mg/dL (ref 8.4–10.5)
Creatinine, Ser: 1.14 mg/dL — ABNORMAL HIGH (ref 0.50–1.10)
GFR, EST AFRICAN AMERICAN: 64 mL/min — AB (ref >90)
GFR, EST NON AFRICAN AMERICAN: 56 mL/min — AB (ref >90)
Glucose, Bld: 99 mg/dL (ref 70–99)
Potassium: 3.4 mmol/L — ABNORMAL LOW (ref 3.5–5.1)
Sodium: 141 mmol/L (ref 135–145)
Total Bilirubin: 1 mg/dL (ref 0.3–1.2)
Total Protein: 6.8 g/dL (ref 6.0–8.3)

## 2015-01-19 LAB — URINE MICROSCOPIC-ADD ON

## 2015-01-19 LAB — LACTIC ACID, PLASMA: Lactic Acid, Venous: 1.6 mmol/L (ref 0.5–2.0)

## 2015-01-19 MED ORDER — HYDROMORPHONE HCL 1 MG/ML IJ SOLN
1.0000 mg | Freq: Once | INTRAMUSCULAR | Status: AC
  Administered 2015-01-19: 1 mg via INTRAVENOUS
  Filled 2015-01-19: qty 1

## 2015-01-19 MED ORDER — IOHEXOL 300 MG/ML  SOLN
25.0000 mL | Freq: Once | INTRAMUSCULAR | Status: AC | PRN
  Administered 2015-01-19: 25 mL via ORAL

## 2015-01-19 MED ORDER — SODIUM CHLORIDE 0.9 % IV BOLUS (SEPSIS)
1000.0000 mL | Freq: Once | INTRAVENOUS | Status: AC
  Administered 2015-01-19: 1000 mL via INTRAVENOUS

## 2015-01-19 MED ORDER — IOHEXOL 300 MG/ML  SOLN: 80.0000 mL | Freq: Once | INTRAMUSCULAR | Status: DC | PRN

## 2015-01-19 MED ORDER — PROMETHAZINE HCL 25 MG PO TABS: 25.0000 mg | ORAL_TABLET | Freq: Four times a day (QID) | ORAL | Status: DC | PRN

## 2015-01-19 MED ORDER — HYDROMORPHONE HCL 1 MG/ML IJ SOLN
1.0000 mg | Freq: Once | INTRAMUSCULAR | Status: AC
Start: 2015-01-19 — End: 2015-01-19
  Administered 2015-01-19: 1 mg via INTRAVENOUS
  Filled 2015-01-19: qty 1

## 2015-01-19 MED ORDER — IOHEXOL 300 MG/ML  SOLN
100.0000 mL | Freq: Once | INTRAMUSCULAR | Status: AC | PRN
  Administered 2015-01-19: 100 mL via INTRAVENOUS

## 2015-01-19 MED ORDER — PROMETHAZINE HCL 25 MG/ML IJ SOLN
12.5000 mg | Freq: Once | INTRAMUSCULAR | Status: AC
  Administered 2015-01-19: 16:00:00 via INTRAVENOUS
  Filled 2015-01-19: qty 1

## 2015-01-19 MED ORDER — ONDANSETRON HCL 4 MG/2ML IJ SOLN
INTRAMUSCULAR | Status: AC
  Administered 2015-01-19: 4 mg
  Filled 2015-01-19: qty 2

## 2015-01-19 NOTE — ED Notes (Signed)
Pt states that she has frequent bouts with her diarrhea that usually last a few weeks and make her hurt all over.  States this is nothing new for her but did not state this until after her daughter brought it up.  Pt states she has been having this for years and they never can find a cause.  States she usually just gets fluids and pain medications and it resolves on its own.  When asked if she has seen a Copywriter, advertising, pt and daughter both state that she has not because she does not feel as if they will do anything for her.

## 2015-01-19 NOTE — ED Notes (Signed)
Pt still has not had any episodes of diarrhea nor has urinated.

## 2015-01-19 NOTE — ED Notes (Signed)
Pt made aware that she needs to obtain a urine and stool sample at this time.  States she feels she is too dehydrated to do either.  Fluids infusing.  Bedside commode taken in room.

## 2015-01-19 NOTE — Discharge Instructions (Signed)
Follow-up with gastroenterologist. CT scan of abdomen and pelvis showed no acute findings. Chest x-ray showed no pneumonia. Increase fluids. Medication for nausea.

## 2015-01-19 NOTE — ED Provider Notes (Addendum)
CSN: [id number]     Arrival date & time 01/19/15  1350 History   First MD Initiated Contact with Patient 01/19/15 1400     Chief Complaint  Patient presents with  . Extremity Pain     (Consider location/radiation/quality/duration/timing/severity/associated sxs/prior Treatment) HPI ...... patient reports diarrhea for 18 days described as mucousy. No blood in stool.  She states she is weak and not keeping fluids down. Colonoscopy 2 years ago did not reveal any severe colonic pathology. She has a history of ITP for which she takes steroids intermittently. No fever sweats or chills.  Past Medical History  Diagnosis Date  . Essential hypertension, benign   . 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  Last was Prednisone $RemoveBefore'40mg'gQZDurhaIqfAI$ ; 2 wk in 10/2012.  She also was on Prednisone bridged with Cellcept briefly but stopped due to lack of insurance.   . Depression   . Thrush, oral 05/29/2011  . Steroid-induced diabetes   . Pulmonary embolism 04/2012  . Asthma   . Renal insufficiency   . Chronic chest pain   . Normal cardiac stress test 02/2014  . Gout    Past Surgical History  Procedure Laterality Date  . Cholecystectomy  2008  . Bone marrow biopsy    . 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 Colonoscopy with esophagogastroduodenoscopy (egd) N/A 01/04/2013    Dr. Gala Romney: esophageal plaques with +KOH, hh. Gastric antrum abnormal, bx reactive gastropathy. Anal canal hemorrhoids, colonic tics, normal TI, single polyp (sessile serrated tubular adenoma). Next TCS 12/2017  . Cataract extraction     Family History  Problem Relation Age of Onset  . Cervical cancer Mother   . Lung cancer Father   . Pneumonia Brother   . Cancer Paternal Grandmother 10    colon cancer, breast cancer  . Cancer Paternal Grandfather 6    colon cancer  . AAA (abdominal aortic aneurysm) Paternal Grandmother    History  Substance  Use Topics  . Smoking status: Former Smoker -- 0 years    Types: Cigarettes    Quit date: 02/14/2009  . Smokeless tobacco: Never Used  . Alcohol Use: No   OB History    No data available     Review of Systems  All other systems reviewed and are negative.     Allergies  Brintellix; Yellow jacket venom; and Adhesive  Home Medications   Prior to Admission medications   Medication Sig Start Date End Date Taking? Authorizing Provider  ALPRAZolam Duanne Moron) 1 MG tablet Take 1 mg by mouth 3 (three) times daily as needed for anxiety.    Yes Historical Provider, MD  calcitonin, salmon, (MIACALCIN/FORTICAL) 200 UNIT/ACT nasal spray Place 1 spray into alternate nostrils daily.   Yes Historical Provider, MD  dexamethasone (DECADRON) 0.5 MG tablet Take 0.5 mg by mouth 2 (two) times daily.    Yes Historical Provider, MD  diphenoxylate-atropine (LOMOTIL) 2.5-0.025 MG per tablet Take 1 tablet by mouth every 6 (six) hours as needed. 01/15/15  Yes Historical Provider, MD  furosemide (LASIX) 20 MG tablet Take 20-40 mg by mouth 2 (two) times daily as needed. 12/16/14  Yes Historical Provider, MD  HYDROcodone-acetaminophen (NORCO) 10-325 MG per tablet Take 1 tablet by mouth every 4 (four) hours as needed for moderate pain.  12/14/14  Yes Historical Provider, MD  KLOR-CON 10 10 MEQ tablet Take 10 mEq by mouth as needed (Only takes with the furosemide).  11/13/14  Yes Historical Provider, MD  loperamide (IMODIUM) 2 MG capsule Take 2 mg by mouth as needed for diarrhea or loose stools.   Yes Historical Provider, MD  metoprolol succinate (TOPROL-XL) 50 MG 24 hr tablet Take 1 tablet by mouth daily. 01/05/15  Yes Historical Provider, MD  montelukast (SINGULAIR) 10 MG tablet Take 10 mg by mouth daily.   Yes Historical Provider, MD  OVER THE COUNTER MEDICATION Place 1 drop into both eyes daily as needed (Dry Eyes).   Yes Historical Provider, MD  PROAIR RESPICLICK 425 (90 BASE) MCG/ACT AEPB Inhale 2 puffs into the lungs  every 4 (four) hours as needed (Shortness of breath).  12/27/14  Yes Historical Provider, MD  sertraline (ZOLOFT) 50 MG tablet Take 150 mg by mouth daily.    Yes Historical Provider, MD  insulin aspart (NOVOLOG) 100 UNIT/ML injection Inject 2-5 Units into the skin daily as needed for high blood sugar. Per sliding scale.    Historical Provider, MD  ipratropium-albuterol (DUONEB) 0.5-2.5 (3) MG/3ML SOLN Take 3 mLs by nebulization 3 (three) times daily as needed (shorntess of breath/wheezing).  03/22/13   Historical Provider, MD  promethazine (PHENERGAN) 25 MG tablet Take 1 tablet (25 mg total) by mouth every 6 (six) hours as needed. 01/19/15   [name], MD   BP 137/96 mmHg  Pulse 97  Temp(Src) 98.8 F (37.1 C) (Oral)  Resp 16  Ht $R'5\' 5"'pK$  (1.651 m)  Wt 223 lb (101.152 kg)  BMI 37.11 kg/m2  SpO2 95%  LMP 05/13/2011 Physical Exam  Constitutional: She is oriented to person, place, and time.  Obese.  Dry mucous membranes  HENT:  Head: Normocephalic and atraumatic.  Eyes: Conjunctivae and EOM are normal. Pupils are equal, round, and reactive to light.  Neck: Normal range of motion. Neck supple.  Cardiovascular: Normal rate and regular rhythm.   Pulmonary/Chest: Effort normal and breath sounds normal.  Abdominal: Soft. Bowel sounds are normal.  Nontender  Musculoskeletal: Normal range of motion.  Neurological: She is alert and oriented to person, place, and time.  Skin: Skin is warm and dry.  Psychiatric: She has a normal mood and affect. Her behavior is normal.  Nursing note and vitals reviewed.   ED Course  Procedures (including critical care time) Labs Review Labs Reviewed  COMPREHENSIVE METABOLIC PANEL - Abnormal; Notable for the following:    Potassium 3.4 (*)    Creatinine, Ser 1.14 (*)    AST 48 (*)    ALT 57 (*)    GFR calc non Af Amer 56 (*)    GFR calc Af Amer 64 (*)    All other components within normal limits  CBC WITH DIFFERENTIAL/PLATELET - Abnormal; Notable for the  following:    RBC 5.58 (*)    Hemoglobin 15.5 (*)    HCT 48.5 (*)    Platelets 129 (*)    All other components within normal limits  URINALYSIS, ROUTINE W REFLEX MICROSCOPIC - Abnormal; Notable for the following:    Specific Gravity, Urine <1.005 (*)    Hgb urine dipstick SMALL (*)    Protein, ur TRACE (*)    All other components within normal limits  STOOL CULTURE  CLOSTRIDIUM DIFFICILE BY PCR  LACTIC ACID, PLASMA  URINE MICROSCOPIC-ADD ON    Imaging Review Dg Chest 2 View  01/19/2015   CLINICAL DATA:  New diarrhea and dehydration  EXAM: CHEST  2 VIEW  COMPARISON:  10/22/2014  FINDINGS: The heart size and mediastinal contours are within  normal limits. Both lungs are clear. The visualized skeletal structures are unremarkable.  IMPRESSION: No active cardiopulmonary disease.   Electronically Signed   By: Franchot Gallo M.D.   On: 01/19/2015 20:34   Ct Abdomen Pelvis W Contrast  01/19/2015   CLINICAL DATA:  Generalized abdominal pain for several days.  EXAM: CT ABDOMEN AND PELVIS WITH CONTRAST  TECHNIQUE: Multidetector CT imaging of the abdomen and pelvis was performed using the standard protocol following bolus administration of intravenous contrast.  CONTRAST:  41mL OMNIPAQUE IOHEXOL 300 MG/ML SOLN, 120mL OMNIPAQUE IOHEXOL 300 MG/ML SOLN  COMPARISON:  CT abdomen and pelvis 01/21/2013.  FINDINGS: BODY WALL: Unremarkable. Tiny umbilical hernia containing only fat. The  LOWER CHEST: Patchy lung base opacity, early infiltrate not excluded. No effusion.  ABDOMEN/PELVIS:  Liver: No focal abnormality.  Biliary: Prior cholecystectomy.  Pancreas: Unremarkable.  Spleen: Prior splenectomy.  Adrenals: Unremarkable.  Kidneys and ureters: No hydronephrosis or stone.  Bladder: Unremarkable.  Reproductive: Unremarkable.  Bowel: No obstruction. No appendiceal inflammation. Partial malrotation of the small bowel.  Retroperitoneum: No mass or adenopathy.  Peritoneum: No free fluid or gas.  Vascular: No acute  abnormality.  OSSEOUS: No acute abnormalities. Chronic T10 and T12 compression deformities.  IMPRESSION: Patchy RIGHT lower lobe opacities could represent early infiltrate.  Status postcholecystectomy and splenectomy. No acute intra-abdominal findings.   Electronically Signed   By: Rolla Flatten M.D.   On: 01/19/2015 17:10     EKG Interpretation None      MDM   Final diagnoses:  Diarrhea    No diarrhea noted in the emergency department. CT abdomen pelvis showed no acute pathology. Chest x-ray negative. No stool samples were obtained. Patient is hemodynamically stable. Discharge medications Phenergan 25 mg. Referral to gastroenterology.    [name], MD 01/19/15 2127  [name], MD 01/19/15 2134

## 2015-01-19 NOTE — ED Notes (Signed)
Pt states she is still unable to provide UA or stool sample.

## 2015-01-19 NOTE — ED Notes (Signed)
Pt reports continued generalized pain and diarrhea. Pt reports seen for same last week with no improvement. Pt reports symptoms have been going on for 18 days. nad noted.

## 2015-01-19 NOTE — ED Notes (Signed)
Pt reports that she drank a Gatorade and a Boost drink very fast because she was thirsty and now she is nauseated.  No active vomiting.

## 2015-01-21 ENCOUNTER — Encounter: Payer: Self-pay | Admitting: Gastroenterology

## 2015-01-21 ENCOUNTER — Telehealth: Payer: Self-pay

## 2015-01-21 NOTE — Telephone Encounter (Signed)
SLF will be fine

## 2015-01-21 NOTE — Telephone Encounter (Signed)
I received a referral from [facility] on patient having severe diarrhea. She has seen both RMR and SF. I am just trying to verify which one to assign to her. Please advise.

## 2015-01-21 NOTE — Telephone Encounter (Signed)
She is a Dr. Gala Romney patient

## 2015-01-23 ENCOUNTER — Telehealth: Payer: Self-pay | Admitting: Cardiology

## 2015-01-23 NOTE — Telephone Encounter (Signed)
Patient has medication questions post ED visit / tg

## 2015-01-23 NOTE — Telephone Encounter (Signed)
Pt states she was told to increase Metoprolol to 100 mg after ED "sometime" last summer/fall. I am unable to verify,can you advise    Forward to Dr.McDowell

## 2015-01-24 ENCOUNTER — Ambulatory Visit (INDEPENDENT_AMBULATORY_CARE_PROVIDER_SITE_OTHER): Payer: Medicare Other | Admitting: Nurse Practitioner

## 2015-01-24 ENCOUNTER — Encounter: Payer: Self-pay | Admitting: Nurse Practitioner

## 2015-01-24 ENCOUNTER — Other Ambulatory Visit: Payer: Self-pay | Admitting: Nurse Practitioner

## 2015-01-24 VITALS — BP 156/99 | HR 93 | Temp 97.1°F | Ht 65.0 in | Wt 225.8 lb

## 2015-01-24 DIAGNOSIS — R1013 Epigastric pain: Secondary | ICD-10-CM | POA: Diagnosis not present

## 2015-01-24 DIAGNOSIS — R197 Diarrhea, unspecified: Secondary | ICD-10-CM | POA: Diagnosis not present

## 2015-01-24 LAB — COMPREHENSIVE METABOLIC PANEL
ALBUMIN: 4 g/dL (ref 3.5–5.2)
ALT: 39 U/L — ABNORMAL HIGH (ref 0–35)
AST: 36 U/L (ref 0–37)
Alkaline Phosphatase: 73 U/L (ref 39–117)
BUN: 16 mg/dL (ref 6–23)
CALCIUM: 9.2 mg/dL (ref 8.4–10.5)
CO2: 23 mEq/L (ref 19–32)
Chloride: 106 mEq/L (ref 96–112)
Creat: 1.27 mg/dL — ABNORMAL HIGH (ref 0.50–1.10)
Glucose, Bld: 92 mg/dL (ref 70–99)
POTASSIUM: 3.6 meq/L (ref 3.5–5.3)
SODIUM: 139 meq/L (ref 135–145)
Total Bilirubin: 0.6 mg/dL (ref 0.2–1.2)
Total Protein: 6.2 g/dL (ref 6.0–8.3)

## 2015-01-24 LAB — LIPASE: Lipase: 65 U/L (ref 0–75)

## 2015-01-24 MED ORDER — DICYCLOMINE HCL 10 MG PO CAPS: 10.0000 mg | ORAL_CAPSULE | Freq: Three times a day (TID) | ORAL | Status: DC

## 2015-01-24 NOTE — Progress Notes (Signed)
Referring Provider: Redmond School, MD Primary Care Physician:  Glo Herring., MD Primary GI:  Dr. Gala Romney  Chief Complaint  Patient presents with  . Diarrhea    HPI:   49 year old female presents for evaluation of severe diarrhea. Was seen in emergency room 01/19/2015 for diarrhea per provider notes no diarrhea and emergency department, as no stool samples were obtained, CT abdomen and pelvis no acute pathology. Was discharged referred to gastroenterology. EGD performed for 914 showed esophageal plaques of unknown certainty which are scraped for Candida, hiatal hernia, abnormal gastric antrum including nodular appearing antrum which biopsy was taken and showed reactive gastropathy. KOH scrape found Candida esophagitis and was ultimately prescribed Diflucan 300 mg daily for 14 days. Colonoscopy done the same day found adequate preparation, anal canal hemorrhoids with a likely source of hematochezia, colonic diverticulosis, single small colonic polyp was removed was removed and pathology report was sessile serrated polyp/adenoma. Recommended Benefiber 2 tablespoons daily, ten-day course of Anusol suppositories, utilize MiraLAX as needed for constipation.  Today she states she's been having diarrhea for about 25 days. Is having as many as 10 or more bowel movements a day. Denies antibiotics in the past 3 months. States her bowel movements are liquidly and occasionally pure water. Also complains of mucus in her stool. Has a bowel movement within 15-20 minutes of eating. Yesterday states she had a salad and a hotdog at [facility] and had to have a bowel movement 20 mins later at [employer]. Feels like her food is not digesting as she noted salad pieces in the toilet. Is also having rectal bleeding, only has toilet tissue hematochezia in the past month, denies melena. Has abdominal pain in the lower abdomen which is described as crampy and occasionally with a sharp characteristic. Her pain is  constant. Admits nausea, denies vomiting. Pain does not improve with a bowel movement. After a bowel movement feels like there's still pressure. Objective weight loss of 13 pounds in 1.5 months. Denies any other upper or lower GI symptoms.  Past Medical History  Diagnosis Date  . Essential hypertension, benign   . 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  Last was Prednisone $RemoveBefore'40mg'pmaTgSKMsYbOr$ ; 2 wk in 10/2012.  She also was on Prednisone bridged with Cellcept briefly but stopped due to lack of insurance.   . Depression   . Thrush, oral 05/29/2011  . Steroid-induced diabetes   . Pulmonary embolism 04/2012  . Asthma   . Renal insufficiency   . Chronic chest pain   . Normal cardiac stress test 02/2014  . Gout     Past Surgical History  Procedure Laterality Date  . Cholecystectomy  2008  . Bone marrow biopsy    . 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 Colonoscopy with esophagogastroduodenoscopy (egd) N/A 01/04/2013    Dr. Gala Romney: esophageal plaques with +KOH, hh. Gastric antrum abnormal, bx reactive gastropathy. Anal canal hemorrhoids, colonic tics, normal TI, single polyp (sessile serrated tubular adenoma). Next TCS 12/2017  . Cataract extraction      Current Outpatient Prescriptions  Medication Sig Dispense Refill  . ALPRAZolam (XANAX) 1 MG tablet Take 1 mg by mouth 3 (three) times daily as needed for anxiety.     . calcitonin, salmon, (MIACALCIN/FORTICAL) 200 UNIT/ACT nasal spray Place 1 spray into alternate nostrils daily.    Marland Kitchen dexamethasone (DECADRON) 0.5 MG tablet Take 0.5 mg by mouth 2 (two) times daily.     Marland Kitchen  diphenoxylate-atropine (LOMOTIL) 2.5-0.025 MG per tablet Take 1 tablet by mouth every 6 (six) hours as needed.  0  . furosemide (LASIX) 20 MG tablet Take 20-40 mg by mouth 2 (two) times daily as needed.  2  . insulin aspart (NOVOLOG) 100 UNIT/ML injection Inject 2-5 Units into the skin daily as  needed for high blood sugar. Per sliding scale.    Marland Kitchen ipratropium-albuterol (DUONEB) 0.5-2.5 (3) MG/3ML SOLN Take 3 mLs by nebulization 3 (three) times daily as needed (shorntess of breath/wheezing).     Marland Kitchen KLOR-CON 10 10 MEQ tablet Take 10 mEq by mouth as needed (Only takes with the furosemide).   2  . metoprolol succinate (TOPROL-XL) 50 MG 24 hr tablet Take 1 tablet by mouth daily.  10  . montelukast (SINGULAIR) 10 MG tablet Take 10 mg by mouth daily.    Marland Kitchen OVER THE COUNTER MEDICATION Place 1 drop into both eyes daily as needed (Dry Eyes).    Marland Kitchen PROAIR RESPICLICK 536 (90 BASE) MCG/ACT AEPB Inhale 2 puffs into the lungs every 4 (four) hours as needed (Shortness of breath).   3  . sertraline (ZOLOFT) 50 MG tablet Take 150 mg by mouth daily.     Marland Kitchen HYDROcodone-acetaminophen (NORCO) 10-325 MG per tablet Take 1 tablet by mouth every 4 (four) hours as needed for moderate pain.   0  . loperamide (IMODIUM) 2 MG capsule Take 2 mg by mouth as needed for diarrhea or loose stools.    . promethazine (PHENERGAN) 25 MG tablet Take 1 tablet (25 mg total) by mouth every 6 (six) hours as needed. (Patient not taking: Reported on 01/24/2015) 15 tablet 0  . [DISCONTINUED] prochlorperazine (COMPAZINE) 10 MG tablet Take 10 mg by mouth as needed. For nausea      No current facility-administered medications for this visit.    Allergies as of 01/24/2015 - Review Complete 01/24/2015  Allergen Reaction Noted  . Brintellix [vortioxetine] Itching 10/28/2014  . Yellow jacket venom Swelling 08/19/2011  . Adhesive [tape] Rash 01/19/2015    Family History  Problem Relation Age of Onset  . Cervical cancer Mother   . Lung cancer Father   . Pneumonia Brother   . Cancer Paternal Grandmother 36    colon cancer, breast cancer  . Cancer Paternal Grandfather 33    colon cancer  . AAA (abdominal aortic aneurysm) Paternal Grandmother     History   Social History  . Marital Status: Divorced    Spouse Name: N/A  . Number of  Children: 1  . Years of Education: N/A   Occupational History  .      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    Social History Main Topics  . Smoking status: Former Smoker -- 0 years    Types: Cigarettes    Quit date: 02/14/2009  . Smokeless tobacco: Never Used  . Alcohol Use: No  . Drug Use: No  . Sexual Activity: No   Other Topics Concern  . None   Social History Narrative    Review of Systems: General: Negative for anorexia. Denies fever. Admits chills 2-3 times a week, fatigue, weakness. Feels she has had some weight loss. Eyes: Negative for vision changes.  ENT: Negative for hoarseness, difficulty swallowing. CV: Negative for chest pain, angina, palpitations, peripheral edema.  Respiratory: Negative for dyspnea at rest, cough, wheezing.  GI: See history of present illness. MS: Negative for joint pain, low back pain.  Derm: Negative for rash or itching.  Neuro: Negative  for, seizure, frequent headaches, memory loss, confusion.  Psych: Negative for anxiety, depression.  Endo: Negative for unusual weight change.  Heme: Negative for bruising or bleeding. Allergy: Negative for rash or hives.   Physical Exam: BP 156/99 mmHg  Pulse 93  Temp(Src) 97.1 F (36.2 C)  Ht $R'5\' 5"'mg$  (1.651 m)  Wt 225 lb 12.8 oz (102.422 kg)  BMI 37.58 kg/m2  LMP 05/13/2011 General:   Alert and oriented. No distress noted. Pleasant and cooperative.  Head:  Normocephalic and atraumatic. Eyes:  Conjuctiva clear without scleral icterus. Mouth:  Oral mucosa pink and moist. No lesions. Neck:  Supple, without mass or thyromegaly. Lungs:  Clear to auscultation bilaterally. No wheezes, rales, or rhonchi. No distress.  Heart:  S1, S2 present without murmurs, rubs, or gallops. Regular rate and rhythm. Abdomen:  +BS, soft, and non-distended. Generalized TTP worse in the pan-lower abdomen. No rebound or guarding. No HSM or masses noted. Pulses:  2+ DP noted bilaterally Extremities:  Without edema. Neurologic:   Alert and  oriented x4;  grossly normal neurologically. Skin:  Intact without significant lesions or rashes. Psych:  Alert and cooperative. Normal mood and affect.    01/24/2015 9:54 AM

## 2015-01-24 NOTE — Patient Instructions (Addendum)
1. Have your labs drawn as soon as he can. 2. We're giving the stool sample cups, please collect a sample from your next diarrhea bowel movement and bring it to the lab. 3. I will start you on a medication to help with her abdominal pain and diarrhea called Bentyl. Take a 20 mg tablet up to 4 times a day. 4. We will call you to results. 5. Return for follow-up in 2 weeks.

## 2015-01-24 NOTE — Telephone Encounter (Signed)
I have not seen her since 2014. There is no way for me to know what she was told by an ER provider last year. I looked at the chart and did not see anything specific. It looks like she has been on the same dose of metoprolol throughout.

## 2015-01-25 ENCOUNTER — Emergency Department (HOSPITAL_COMMUNITY)
Admission: EM | Admit: 2015-01-25 | Discharge: 2015-01-25 | Disposition: A | Discharge status: Home / Self Care | Payer: Medicare Other | Attending: Emergency Medicine | Admitting: Emergency Medicine

## 2015-01-25 ENCOUNTER — Encounter (HOSPITAL_COMMUNITY): Payer: Self-pay | Admitting: *Deleted

## 2015-01-25 ENCOUNTER — Telehealth: Payer: Self-pay | Admitting: Internal Medicine

## 2015-01-25 DIAGNOSIS — I10 Essential (primary) hypertension: Secondary | ICD-10-CM | POA: Insufficient documentation

## 2015-01-25 DIAGNOSIS — E099 Drug or chemical induced diabetes mellitus without complications: Secondary | ICD-10-CM | POA: Insufficient documentation

## 2015-01-25 DIAGNOSIS — Z87891 Personal history of nicotine dependence: Secondary | ICD-10-CM | POA: Insufficient documentation

## 2015-01-25 DIAGNOSIS — Z8619 Personal history of other infectious and parasitic diseases: Secondary | ICD-10-CM | POA: Insufficient documentation

## 2015-01-25 DIAGNOSIS — R197 Diarrhea, unspecified: Secondary | ICD-10-CM | POA: Diagnosis not present

## 2015-01-25 DIAGNOSIS — T380X5A Adverse effect of glucocorticoids and synthetic analogues, initial encounter: Secondary | ICD-10-CM | POA: Insufficient documentation

## 2015-01-25 DIAGNOSIS — J45909 Unspecified asthma, uncomplicated: Secondary | ICD-10-CM | POA: Insufficient documentation

## 2015-01-25 DIAGNOSIS — R51 Headache: Secondary | ICD-10-CM | POA: Diagnosis not present

## 2015-01-25 DIAGNOSIS — G8929 Other chronic pain: Secondary | ICD-10-CM | POA: Insufficient documentation

## 2015-01-25 DIAGNOSIS — E860 Dehydration: Secondary | ICD-10-CM

## 2015-01-25 DIAGNOSIS — F329 Major depressive disorder, single episode, unspecified: Secondary | ICD-10-CM | POA: Diagnosis not present

## 2015-01-25 DIAGNOSIS — Z86711 Personal history of pulmonary embolism: Secondary | ICD-10-CM | POA: Diagnosis not present

## 2015-01-25 DIAGNOSIS — Z794 Long term (current) use of insulin: Secondary | ICD-10-CM | POA: Insufficient documentation

## 2015-01-25 DIAGNOSIS — Z79899 Other long term (current) drug therapy: Secondary | ICD-10-CM | POA: Diagnosis not present

## 2015-01-25 DIAGNOSIS — Z9049 Acquired absence of other specified parts of digestive tract: Secondary | ICD-10-CM | POA: Insufficient documentation

## 2015-01-25 LAB — CBC WITH DIFFERENTIAL/PLATELET
BASOS PCT: 1 % (ref 0–1)
Basophils Absolute: 0.1 10*3/uL (ref 0.0–0.1)
Basophils Absolute: 0.1 10*3/uL (ref 0.0–0.1)
Basophils Relative: 1 % (ref 0–1)
EOS ABS: 0.1 10*3/uL (ref 0.0–0.7)
EOS PCT: 1 % (ref 0–5)
EOS PCT: 1 % (ref 0–5)
Eosinophils Absolute: 0.1 10*3/uL (ref 0.0–0.7)
HCT: 46.6 % — ABNORMAL HIGH (ref 36.0–46.0)
HCT: 47.5 % — ABNORMAL HIGH (ref 36.0–46.0)
Hemoglobin: 15.6 g/dL — ABNORMAL HIGH (ref 12.0–15.0)
Hemoglobin: 15.3 g/dL — ABNORMAL HIGH (ref 12.0–15.0)
LYMPHS ABS: 2 10*3/uL (ref 0.7–4.0)
LYMPHS PCT: 15 % (ref 12–46)
Lymphocytes Relative: 21 % (ref 12–46)
Lymphs Abs: 1.5 10*3/uL (ref 0.7–4.0)
MCH: 27.6 pg (ref 26.0–34.0)
MCH: 28.1 pg (ref 26.0–34.0)
MCHC: 33.5 g/dL (ref 30.0–36.0)
MCHC: 32.2 g/dL (ref 30.0–36.0)
MCV: 82.3 fL (ref 78.0–100.0)
MCV: 87.2 fL (ref 78.0–100.0)
MONOS PCT: 10 % (ref 3–12)
Monocytes Absolute: 1.2 10*3/uL — ABNORMAL HIGH (ref 0.1–1.0)
Monocytes Absolute: 0.9 10*3/uL (ref 0.1–1.0)
Monocytes Relative: 12 % (ref 3–12)
NEUTROS ABS: 6.2 10*3/uL (ref 1.7–7.7)
NEUTROS ABS: 7.2 10*3/uL (ref 1.7–7.7)
NEUTROS PCT: 74 % (ref 43–77)
Neutrophils Relative %: 65 % (ref 43–77)
Platelets: 139 10*3/uL — ABNORMAL LOW (ref 150–400)
Platelets: 144 10*3/uL — ABNORMAL LOW (ref 150–400)
RBC: 5.66 MIL/uL — ABNORMAL HIGH (ref 3.87–5.11)
RBC: 5.45 MIL/uL — ABNORMAL HIGH (ref 3.87–5.11)
RDW: 15.1 % (ref 11.5–15.5)
RDW: 15 % (ref 11.5–15.5)
WBC: 9.6 10*3/uL (ref 4.0–10.5)
WBC: 9.7 10*3/uL (ref 4.0–10.5)

## 2015-01-25 LAB — BASIC METABOLIC PANEL
Anion gap: 10 (ref 5–15)
BUN: 12 mg/dL (ref 6–23)
CO2: 22 mmol/L (ref 19–32)
CREATININE: 1.2 mg/dL — AB (ref 0.50–1.10)
Calcium: 9 mg/dL (ref 8.4–10.5)
Chloride: 109 mmol/L (ref 96–112)
GFR calc Af Amer: 60 mL/min — ABNORMAL LOW (ref >90)
GFR, EST NON AFRICAN AMERICAN: 52 mL/min — AB (ref >90)
Glucose, Bld: 96 mg/dL (ref 70–99)
Potassium: 3.3 mmol/L — ABNORMAL LOW (ref 3.5–5.1)
Sodium: 141 mmol/L (ref 135–145)

## 2015-01-25 LAB — CLOSTRIDIUM DIFFICILE BY PCR: Toxigenic C. Difficile by PCR: NOT DETECTED

## 2015-01-25 LAB — GIARDIA ANTIGEN: Giardia Screen (EIA): NEGATIVE

## 2015-01-25 MED ORDER — KETOROLAC TROMETHAMINE 30 MG/ML IJ SOLN
30.0000 mg | Freq: Once | INTRAMUSCULAR | Status: AC
  Administered 2015-01-25: 30 mg via INTRAVENOUS
  Filled 2015-01-25: qty 1

## 2015-01-25 MED ORDER — METOPROLOL SUCCINATE ER 50 MG PO TB24: 50.0000 mg | ORAL_TABLET | Freq: Every day | ORAL | Status: DC

## 2015-01-25 MED ORDER — SODIUM CHLORIDE 0.9 % IV BOLUS (SEPSIS)
1000.0000 mL | Freq: Once | INTRAVENOUS | Status: AC
Start: 2015-01-25 — End: 2015-01-25
  Administered 2015-01-25: 1000 mL via INTRAVENOUS

## 2015-01-25 MED ORDER — DIPHENHYDRAMINE HCL 50 MG/ML IJ SOLN
25.0000 mg | Freq: Once | INTRAMUSCULAR | Status: AC
  Administered 2015-01-25: 25 mg via INTRAVENOUS
  Filled 2015-01-25: qty 1

## 2015-01-25 MED ORDER — METOCLOPRAMIDE HCL 5 MG/ML IJ SOLN
10.0000 mg | Freq: Once | INTRAMUSCULAR | Status: AC
  Administered 2015-01-25: 10 mg via INTRAVENOUS
  Filled 2015-01-25: qty 2

## 2015-01-25 NOTE — Telephone Encounter (Signed)
Spoke with the pt- she is miserable and feeling like she is dehydrated. She took the stool studies back to the lab yesterday. I spoke with Randall Hiss, he said if the pt is not better and feeling dehydrated she should go to the ED. Pt is aware and said she would go.

## 2015-01-25 NOTE — Telephone Encounter (Signed)
I have refilled metoprolol at dose pt is taking per our records 50 mg.pt has fu next month and can discuss with MD

## 2015-01-25 NOTE — Discharge Instructions (Signed)
Dehydration, Adult °Dehydration is when you lose more fluids from the body than you take in. Vital organs like the kidneys, brain, and heart cannot function without a proper amount of fluids and salt. Any loss of fluids from the body can cause dehydration.  °CAUSES  °· Vomiting. °· Diarrhea. °· Excessive sweating. °· Excessive urine output. °· Fever. °SYMPTOMS  °Mild dehydration °· Thirst. °· Dry lips. °· Slightly dry mouth. °Moderate dehydration °· Very dry mouth. °· Sunken eyes. °· Skin does not bounce back quickly when lightly pinched and released. °· Dark urine and decreased urine production. °· Decreased tear production. °· Headache. °Severe dehydration °· Very dry mouth. °· Extreme thirst. °· Rapid, weak pulse (more than 100 beats per minute at rest). °· Cold hands and feet. °· Not able to sweat in spite of heat and temperature. °· Rapid breathing. °· Blue lips. °· Confusion and lethargy. °· Difficulty being awakened. °· Minimal urine production. °· No tears. °DIAGNOSIS  °Your caregiver will diagnose dehydration based on your symptoms and your exam. Blood and urine tests will help confirm the diagnosis. The diagnostic evaluation should also identify the cause of dehydration. °TREATMENT  °Treatment of mild or moderate dehydration can often be done at home by increasing the amount of fluids that you drink. It is best to drink small amounts of fluid more often. Drinking too much at one time can make vomiting worse. Refer to the home care instructions below. °Severe dehydration needs to be treated at the hospital where you will probably be given intravenous (IV) fluids that contain water and electrolytes. °HOME CARE INSTRUCTIONS  °· Ask your caregiver about specific rehydration instructions. °· Drink enough fluids to keep your urine clear or pale yellow. °· Drink small amounts frequently if you have nausea and vomiting. °· Eat as you normally do. °· Avoid: °¨ Foods or drinks high in sugar. °¨ Carbonated  drinks. °¨ Juice. °¨ Extremely hot or cold fluids. °¨ Drinks with caffeine. °¨ Fatty, greasy foods. °¨ Alcohol. °¨ Tobacco. °¨ Overeating. °¨ Gelatin desserts. °· Wash your hands well to avoid spreading bacteria and viruses. °· Only take over-the-counter or prescription medicines for pain, discomfort, or fever as directed by your caregiver. °· Ask your caregiver if you should continue all prescribed and over-the-counter medicines. °· Keep all follow-up appointments with your caregiver. °SEEK MEDICAL CARE IF: °· You have abdominal pain and it increases or stays in one area (localizes). °· You have a rash, stiff neck, or severe headache. °· You are irritable, sleepy, or difficult to awaken. °· You are weak, dizzy, or extremely thirsty. °SEEK IMMEDIATE MEDICAL CARE IF:  °· You are unable to keep fluids down or you get worse despite treatment. °· You have frequent episodes of vomiting or diarrhea. °· You have blood or green matter (bile) in your vomit. °· You have blood in your stool or your stool looks black and tarry. °· You have not urinated in 6 to 8 hours, or you have only urinated a small amount of very dark urine. °· You have a fever. °· You faint. °MAKE SURE YOU:  °· Understand these instructions. °· Will watch your condition. °· Will get help right away if you are not doing well or get worse. °Document Released: 09/14/2005 Document Revised: 12/07/2011 Document Reviewed: 05/04/2011 °Exit[employer]® Patient Information ©2015 [employer]. This information is not intended to replace advice given to you by your health care provider. Make sure you discuss any questions you have with your health care   provider. ° °Diarrhea °Diarrhea is frequent loose and watery bowel movements. It can cause you to feel weak and dehydrated. Dehydration can cause you to become tired and thirsty, have a dry mouth, and have decreased urination that often is dark yellow. Diarrhea is a sign of another problem, most often an infection that will  not last long. In most cases, diarrhea typically lasts 2-3 days. However, it can last longer if it is a sign of something more serious. It is important to treat your diarrhea as directed by your caregiver to lessen or prevent future episodes of diarrhea. °CAUSES  °Some common causes include: °· Gastrointestinal infections caused by viruses, bacteria, or parasites. °· Food poisoning or food allergies. °· Certain medicines, such as antibiotics, chemotherapy, and laxatives. °· Artificial sweeteners and fructose. °· Digestive disorders. °HOME CARE INSTRUCTIONS °· Ensure adequate fluid intake (hydration): Have 1 cup (8 oz) of fluid for each diarrhea episode. Avoid fluids that contain simple sugars or sports drinks, fruit juices, whole milk products, and sodas. Your urine should be clear or pale yellow if you are drinking enough fluids. Hydrate with an oral rehydration solution that you can purchase at pharmacies, retail stores, and online. You can prepare an oral rehydration solution at home by mixing the following ingredients together: °¨  - tsp table salt. °¨ ¾ tsp baking soda. °¨  tsp salt substitute containing potassium chloride. °¨ 1  tablespoons sugar. °¨ 1 L (34 oz) of water. °· Certain foods and beverages may increase the speed at which food moves through the gastrointestinal (GI) tract. These foods and beverages should be avoided and include: °¨ Caffeinated and alcoholic beverages. °¨ High-fiber foods, such as raw fruits and vegetables, nuts, seeds, and whole grain breads and cereals. °¨ Foods and beverages sweetened with sugar alcohols, such as xylitol, sorbitol, and mannitol. °· Some foods may be well tolerated and may help thicken stool including: °¨ Starchy foods, such as rice, toast, pasta, low-sugar cereal, oatmeal, grits, baked potatoes, crackers, and bagels. °¨ Bananas. °¨ Applesauce. °· Add probiotic-rich foods to help increase healthy bacteria in the GI tract, such as yogurt and fermented milk  products. °· Wash your hands well after each diarrhea episode. °· Only take over-the-counter or prescription medicines as directed by your caregiver. °· Take a warm bath to relieve any burning or pain from frequent diarrhea episodes. °SEEK IMMEDIATE MEDICAL CARE IF:  °· You are unable to keep fluids down. °· You have persistent vomiting. °· You have blood in your stool, or your stools are black and tarry. °· You do not urinate in 6-8 hours, or there is only a small amount of very dark urine. °· You have abdominal pain that increases or localizes. °· You have weakness, dizziness, confusion, or light-headedness. °· You have a severe headache. °· Your diarrhea gets worse or does not get better. °· You have a fever or persistent symptoms for more than 2-3 days. °· You have a fever and your symptoms suddenly get worse. °MAKE SURE YOU:  °· Understand these instructions. °· Will watch your condition. °· Will get help right away if you are not doing well or get worse. °Document Released: 09/04/2002 Document Revised: 01/29/2014 Document Reviewed: 05/22/2012 °ExitCare® Patient Information ©2015 [employer]. This information is not intended to replace advice given to you by your health care provider. Make sure you discuss any questions you have with your health care provider. ° °

## 2015-01-25 NOTE — ED Provider Notes (Signed)
CSN: [id number]     Arrival date & time 01/25/15  1211 History   First MD Initiated Contact with Patient 01/25/15 1256     Chief Complaint  Patient presents with  . Headache  . Diarrhea     (Consider location/radiation/quality/duration/timing/severity/associated sxs/prior Treatment) HPI Comments: Patient presents to the ER for evaluation of diarrhea. Patient reports everything she eats or drinks goes "straight through the". She has been seen several times for this in the past. She saw Dr. Gala Romney yesterday. Stool cultures are pending. Patient is taking Bentyl without improvement. She has not had any fever. Patient reports that she feels dehydrated, very weak. She also is experiencing a headache.  Patient is a 49 y.o. female presenting with headaches and diarrhea.  Headache Associated symptoms: diarrhea and fatigue   Diarrhea Associated symptoms: headaches     Past Medical History  Diagnosis Date  . Essential hypertension, benign   . 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  Last was Prednisone $RemoveBefore'40mg'tnrAvamWFJoys$ ; 2 wk in 10/2012.  She also was on Prednisone bridged with Cellcept briefly but stopped due to lack of insurance.   . Depression   . Thrush, oral 05/29/2011  . Steroid-induced diabetes   . Pulmonary embolism 04/2012  . Asthma   . Renal insufficiency   . Chronic chest pain   . Normal cardiac stress test 02/2014  . Gout    Past Surgical History  Procedure Laterality Date  . Cholecystectomy  2008  . Bone marrow biopsy    . 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 Colonoscopy with esophagogastroduodenoscopy (egd) N/A 01/04/2013    Dr. Gala Romney: esophageal plaques with +KOH, hh. Gastric antrum abnormal, bx reactive gastropathy. Anal canal hemorrhoids, colonic tics, normal TI, single polyp (sessile serrated tubular adenoma). Next TCS 12/2017  . Cataract extraction     Family History  Problem  Relation Age of Onset  . Cervical cancer Mother   . Lung cancer Father   . Pneumonia Brother   . Cancer Paternal Grandmother 49    colon cancer, breast cancer  . Cancer Paternal Grandfather 42    colon cancer  . AAA (abdominal aortic aneurysm) Paternal Grandmother    History  Substance Use Topics  . Smoking status: Former Smoker -- 0 years    Types: Cigarettes    Quit date: 02/14/2009  . Smokeless tobacco: Never Used  . Alcohol Use: No   OB History    No data available     Review of Systems  Constitutional: Positive for fatigue.  Gastrointestinal: Positive for diarrhea.  Neurological: Positive for headaches.  All other systems reviewed and are negative.     Allergies  Brintellix; Yellow jacket venom; and Adhesive  Home Medications   Prior to Admission medications   Medication Sig Start Date End Date Taking? Authorizing Provider  ALPRAZolam Duanne Moron) 1 MG tablet Take 1 mg by mouth 3 (three) times daily as needed for anxiety.    Yes Historical Provider, MD  calcitonin, salmon, (MIACALCIN/FORTICAL) 200 UNIT/ACT nasal spray Place 1 spray into alternate nostrils daily.   Yes Historical Provider, MD  dexamethasone (DECADRON) 0.5 MG tablet Take 0.5 mg by mouth 2 (two) times daily.    Yes Historical Provider, MD  dicyclomine (BENTYL) 10 MG capsule Take 1 capsule (10 mg total) by mouth 4 (four) times daily -  before meals and at bedtime. 01/24/15  Yes Carlis Stable, NP  diphenoxylate-atropine (LOMOTIL) 2.5-0.025  MG per tablet Take 1 tablet by mouth every 6 (six) hours as needed. 01/15/15  Yes Historical Provider, MD  furosemide (LASIX) 20 MG tablet Take 20-40 mg by mouth 2 (two) times daily as needed. 12/16/14  Yes Historical Provider, MD  insulin aspart (NOVOLOG) 100 UNIT/ML injection Inject 2-5 Units into the skin daily as needed for high blood sugar. Per sliding scale.   Yes Historical Provider, MD  ipratropium-albuterol (DUONEB) 0.5-2.5 (3) MG/3ML SOLN Take 3 mLs by nebulization 3  (three) times daily as needed (shorntess of breath/wheezing).  03/22/13  Yes Historical Provider, MD  KLOR-CON 10 10 MEQ tablet Take 10 mEq by mouth as needed (Only takes with the furosemide).  11/13/14  Yes Historical Provider, MD  metoprolol succinate (TOPROL-XL) 50 MG 24 hr tablet Take 1 tablet (50 mg total) by mouth daily. 01/25/15  Yes Satira Sark, MD  montelukast (SINGULAIR) 10 MG tablet Take 10 mg by mouth daily.   Yes Historical Provider, MD  OVER THE COUNTER MEDICATION Place 1 drop into both eyes daily as needed (Dry Eyes).   Yes Historical Provider, MD  PROAIR RESPICLICK 165 (90 BASE) MCG/ACT AEPB Inhale 2 puffs into the lungs every 4 (four) hours as needed (Shortness of breath).  12/27/14  Yes Historical Provider, MD  sertraline (ZOLOFT) 50 MG tablet Take 150 mg by mouth daily.    Yes Historical Provider, MD   BP 124/99 mmHg  Pulse 111  Temp(Src) 98.2 F (36.8 C) (Oral)  Resp 17  Ht $R'5\' 5"'BW$  (1.651 m)  Wt 222 lb (100.699 kg)  BMI 36.94 kg/m2  SpO2 95%  LMP 05/13/2011 Physical Exam  Constitutional: She is oriented to person, place, and time. She appears well-developed and well-nourished. No distress.  HENT:  Head: Normocephalic and atraumatic.  Right Ear: Hearing normal.  Left Ear: Hearing normal.  Nose: Nose normal.  Mouth/Throat: Oropharynx is clear and moist and mucous membranes are normal.  Eyes: Conjunctivae and EOM are normal. Pupils are equal, round, and reactive to light.  Neck: Normal range of motion. Neck supple.  Cardiovascular: Regular rhythm, S1 normal and S2 normal.  Exam reveals no gallop and no friction rub.   No murmur heard. Pulmonary/Chest: Effort normal and breath sounds normal. No respiratory distress. She exhibits no tenderness.  Abdominal: Soft. Normal appearance and bowel sounds are normal. There is no hepatosplenomegaly. There is no tenderness. There is no rebound, no guarding, no tenderness at McBurney's point and negative Murphy's sign. No hernia.   Musculoskeletal: Normal range of motion.  Neurological: She is alert and oriented to person, place, and time. She has normal strength. No cranial nerve deficit or sensory deficit. Coordination normal. GCS eye subscore is 4. GCS verbal subscore is 5. GCS motor subscore is 6.  Skin: Skin is warm, dry and intact. No rash noted. No cyanosis.  Psychiatric: She has a normal mood and affect. Her speech is normal and behavior is normal. Thought content normal.  Nursing note and vitals reviewed.   ED Course  Procedures (including critical care time) Labs Review Labs Reviewed  CBC WITH DIFFERENTIAL/PLATELET - Abnormal; Notable for the following:    RBC 5.45 (*)    Hemoglobin 15.3 (*)    HCT 47.5 (*)    Platelets 144 (*)    All other components within normal limits  BASIC METABOLIC PANEL - Abnormal; Notable for the following:    Potassium 3.3 (*)    Creatinine, Ser 1.20 (*)    GFR calc non Af  Amer 52 (*)    GFR calc Af Amer 60 (*)    All other components within normal limits    Imaging Review No results found.   EKG Interpretation None      MDM   Final diagnoses:  Dehydration  Diarrhea    Patient presents to the emergency department for evaluation of chronic diarrhea. She is under the care of gastroenterology currently. Patient reports that she feels dehydrated. She was administered IV fluids here in the ER with improvement. She also was complaining of headache. This resolved with treatment with Toradol, Reglan, Benadryl. Basic labs are unremarkable. Patient reports that she has a prescription for Lomotil at [facility], has not picked it up. She was encouraged to take Lomotil as needed.    Orpah Greek, MD 01/25/15 [phone]

## 2015-01-25 NOTE — ED Notes (Signed)
Pt states she has had headache and diarrhea for over a week. The pt was seen for same last week, followed up with her doctor, placed on medicine for diarrhea and took stool sample, pt called her PCP today and was instructed to come to ER if she feels like she is dehydrated.

## 2015-01-25 NOTE — ED Notes (Signed)
Pt states sick x 1 mo. States everything she eats or drinks goes "straight through me", causing diarrhea. This is her third visit here for same over past 2-3 weeks for same. Pt states intermittent pain to head, legs and abdomen. Pt was seen at [facility] yesterday and given a Rx of dicyclomine. Pt states she has had these same issues since having a colonoscopy 2 years ago. NAD at this time,

## 2015-01-25 NOTE — Telephone Encounter (Signed)
PLEASE CALL PATIENT SHE STATES THAT THE MEDICINE IS NOT WORKING FOR THE DIARRHEA SHE WAS GIVEN YESTERDAY AND SHE IS FEELING DEHYDRATED.  WANTS TO KNOW IF SHE SHOULD GO TO THE ER PLEASE ADVISE

## 2015-01-27 NOTE — Assessment & Plan Note (Signed)
49 year old female patient with relatively new onset diarrhea which is progressive. States diarrhea for 10 episodes a day, often watery, for about 25 days. Has had objective weight loss of 13 pounds in 1.5 months. Often has bowel movement within 15-20 minutes of eating. Also with abdominal pain in the lower abdomen which is crampy, constant. Denies melena and hematochezia and any other red flag/warning symptoms. Today we will check stool studies, a CBC, CMP, and lipase. Will send in Rx of bentyl 10 mg qid prn abdominal cramping for up to 10 days for symptomatic abdominal pain. RTC 2 weeks after labs and stool studies back to follow-up on symptoms and results.

## 2015-01-28 ENCOUNTER — Telehealth: Payer: Self-pay | Admitting: Internal Medicine

## 2015-01-28 LAB — STOOL CULTURE

## 2015-01-28 NOTE — Progress Notes (Signed)
cc'ed to pcp °

## 2015-01-28 NOTE — Telephone Encounter (Signed)
Pt has labs and stools done last Thursday and was checking on the results. I told her the nurse would call her when they are signed off and it will be 7-10 business days. She said that she is still having problems and was just anxious about getting her results. She does have OV on 5/16. Please advise [phone]

## 2015-01-28 NOTE — Telephone Encounter (Signed)
Routing to EG for results.

## 2015-02-08 ENCOUNTER — Ambulatory Visit: Payer: Medicare Other | Admitting: Gastroenterology

## 2015-02-11 ENCOUNTER — Telehealth: Payer: Self-pay | Admitting: Internal Medicine

## 2015-02-11 ENCOUNTER — Encounter: Payer: Self-pay | Admitting: Nurse Practitioner

## 2015-02-11 ENCOUNTER — Encounter: Payer: Medicare Other | Admitting: Cardiology

## 2015-02-11 ENCOUNTER — Encounter: Payer: Self-pay | Admitting: Cardiology

## 2015-02-11 ENCOUNTER — Ambulatory Visit: Payer: Medicare Other | Admitting: Nurse Practitioner

## 2015-02-11 NOTE — Telephone Encounter (Signed)
Results were entered into a result note and appear to have been called to the patient.

## 2015-02-11 NOTE — Telephone Encounter (Signed)
PATIENT WAS A NO SHOW AND LETTER WAS SENT  °

## 2015-02-11 NOTE — Telephone Encounter (Signed)
Noted  

## 2015-02-11 NOTE — Progress Notes (Signed)
No show  This encounter was created in error - please disregard.

## 2015-02-16 ENCOUNTER — Encounter (HOSPITAL_COMMUNITY): Payer: Self-pay | Admitting: *Deleted

## 2015-02-16 ENCOUNTER — Emergency Department (HOSPITAL_COMMUNITY): Payer: Medicare Other

## 2015-02-16 ENCOUNTER — Inpatient Hospital Stay (HOSPITAL_COMMUNITY)
Admission: EM | Admit: 2015-02-16 | Discharge: 2015-02-18 | DRG: 683 | Disposition: A | Discharge status: Home / Self Care | Payer: Medicare Other | Attending: Internal Medicine | Admitting: Internal Medicine

## 2015-02-16 DIAGNOSIS — J209 Acute bronchitis, unspecified: Secondary | ICD-10-CM | POA: Diagnosis present

## 2015-02-16 DIAGNOSIS — I10 Essential (primary) hypertension: Secondary | ICD-10-CM | POA: Diagnosis present

## 2015-02-16 DIAGNOSIS — J45909 Unspecified asthma, uncomplicated: Secondary | ICD-10-CM

## 2015-02-16 DIAGNOSIS — I9589 Other hypotension: Secondary | ICD-10-CM

## 2015-02-16 DIAGNOSIS — I951 Orthostatic hypotension: Secondary | ICD-10-CM | POA: Diagnosis not present

## 2015-02-16 DIAGNOSIS — R112 Nausea with vomiting, unspecified: Secondary | ICD-10-CM | POA: Diagnosis present

## 2015-02-16 DIAGNOSIS — Z800 Family history of malignant neoplasm of digestive organs: Secondary | ICD-10-CM | POA: Diagnosis not present

## 2015-02-16 DIAGNOSIS — R509 Fever, unspecified: Secondary | ICD-10-CM | POA: Diagnosis not present

## 2015-02-16 DIAGNOSIS — Z801 Family history of malignant neoplasm of trachea, bronchus and lung: Secondary | ICD-10-CM | POA: Diagnosis not present

## 2015-02-16 DIAGNOSIS — J40 Bronchitis, not specified as acute or chronic: Secondary | ICD-10-CM

## 2015-02-16 DIAGNOSIS — Z7952 Long term (current) use of systemic steroids: Secondary | ICD-10-CM

## 2015-02-16 DIAGNOSIS — Z794 Long term (current) use of insulin: Secondary | ICD-10-CM | POA: Diagnosis not present

## 2015-02-16 DIAGNOSIS — E860 Dehydration: Secondary | ICD-10-CM | POA: Diagnosis present

## 2015-02-16 DIAGNOSIS — I959 Hypotension, unspecified: Secondary | ICD-10-CM | POA: Diagnosis present

## 2015-02-16 DIAGNOSIS — Z9081 Acquired absence of spleen: Secondary | ICD-10-CM | POA: Diagnosis present

## 2015-02-16 DIAGNOSIS — F411 Generalized anxiety disorder: Secondary | ICD-10-CM | POA: Diagnosis not present

## 2015-02-16 DIAGNOSIS — Z86711 Personal history of pulmonary embolism: Secondary | ICD-10-CM

## 2015-02-16 DIAGNOSIS — D693 Immune thrombocytopenic purpura: Secondary | ICD-10-CM | POA: Diagnosis not present

## 2015-02-16 DIAGNOSIS — N179 Acute kidney failure, unspecified: Secondary | ICD-10-CM | POA: Diagnosis present

## 2015-02-16 DIAGNOSIS — Z803 Family history of malignant neoplasm of breast: Secondary | ICD-10-CM

## 2015-02-16 DIAGNOSIS — R05 Cough: Secondary | ICD-10-CM | POA: Diagnosis not present

## 2015-02-16 DIAGNOSIS — Z87891 Personal history of nicotine dependence: Secondary | ICD-10-CM

## 2015-02-16 DIAGNOSIS — A419 Sepsis, unspecified organism: Secondary | ICD-10-CM | POA: Insufficient documentation

## 2015-02-16 DIAGNOSIS — Z8049 Family history of malignant neoplasm of other genital organs: Secondary | ICD-10-CM | POA: Diagnosis not present

## 2015-02-16 DIAGNOSIS — R0602 Shortness of breath: Secondary | ICD-10-CM | POA: Diagnosis not present

## 2015-02-16 DIAGNOSIS — R1110 Vomiting, unspecified: Secondary | ICD-10-CM | POA: Diagnosis not present

## 2015-02-16 LAB — CBC WITH DIFFERENTIAL/PLATELET
BASOS ABS: 0.1 10*3/uL (ref 0.0–0.1)
BASOS PCT: 1 % (ref 0–1)
EOS ABS: 0.3 10*3/uL (ref 0.0–0.7)
Eosinophils Relative: 3 % (ref 0–5)
HEMATOCRIT: 55.1 % — AB (ref 36.0–46.0)
Hemoglobin: 17.7 g/dL — ABNORMAL HIGH (ref 12.0–15.0)
LYMPHS ABS: 3 10*3/uL (ref 0.7–4.0)
LYMPHS PCT: 30 % (ref 12–46)
MCH: 28.3 pg (ref 26.0–34.0)
MCHC: 32.1 g/dL (ref 30.0–36.0)
MCV: 88 fL (ref 78.0–100.0)
MONOS PCT: 13 % — AB (ref 3–12)
Monocytes Absolute: 1.3 10*3/uL — ABNORMAL HIGH (ref 0.1–1.0)
NEUTROS PCT: 53 % (ref 43–77)
Neutro Abs: 5.4 10*3/uL (ref 1.7–7.7)
Platelets: 64 10*3/uL — ABNORMAL LOW (ref 150–400)
RBC: 6.26 MIL/uL — ABNORMAL HIGH (ref 3.87–5.11)
RDW: 15.4 % (ref 11.5–15.5)
WBC: 10.1 10*3/uL (ref 4.0–10.5)

## 2015-02-16 LAB — INFLUENZA PANEL BY PCR (TYPE A & B)
H1N1 flu by pcr: NOT DETECTED
INFLAPCR: NEGATIVE
INFLBPCR: NEGATIVE

## 2015-02-16 LAB — COMPREHENSIVE METABOLIC PANEL
ALBUMIN: 4.2 g/dL (ref 3.5–5.0)
ALT: 37 U/L (ref 14–54)
AST: 48 U/L — ABNORMAL HIGH (ref 15–41)
Alkaline Phosphatase: 78 U/L (ref 38–126)
Anion gap: 12 (ref 5–15)
BUN: 16 mg/dL (ref 6–20)
CHLORIDE: 101 mmol/L (ref 101–111)
CO2: 23 mmol/L (ref 22–32)
CREATININE: 1.84 mg/dL — AB (ref 0.44–1.00)
Calcium: 9.7 mg/dL (ref 8.9–10.3)
GFR calc non Af Amer: 31 mL/min — ABNORMAL LOW (ref >60)
GFR, EST AFRICAN AMERICAN: 36 mL/min — AB (ref >60)
GLUCOSE: 92 mg/dL (ref 65–99)
Potassium: 4.3 mmol/L (ref 3.5–5.1)
SODIUM: 136 mmol/L (ref 135–145)
Total Bilirubin: 1 mg/dL (ref 0.3–1.2)
Total Protein: 7.8 g/dL (ref 6.5–8.1)

## 2015-02-16 LAB — I-STAT CG4 LACTIC ACID, ED: LACTIC ACID, VENOUS: 2.18 mmol/L — AB (ref 0.5–2.0)

## 2015-02-16 LAB — GLUCOSE, CAPILLARY
GLUCOSE-CAPILLARY: 49 mg/dL — AB (ref 65–99)
GLUCOSE-CAPILLARY: 115 mg/dL — AB (ref 65–99)
GLUCOSE-CAPILLARY: 73 mg/dL (ref 65–99)

## 2015-02-16 MED ORDER — PROMETHAZINE HCL 25 MG/ML IJ SOLN
12.5000 mg | Freq: Four times a day (QID) | INTRAMUSCULAR | Status: DC | PRN
  Administered 2015-02-16: 12.5 mg via INTRAVENOUS

## 2015-02-16 MED ORDER — DEXTROSE 50 % IV SOLN
INTRAVENOUS | Status: AC
  Filled 2015-02-16: qty 50

## 2015-02-16 MED ORDER — INSULIN ASPART 100 UNIT/ML ~~LOC~~ SOLN
0.0000 [IU] | Freq: Three times a day (TID) | SUBCUTANEOUS | Status: DC
Start: 2015-02-17 — End: 2015-02-19
  Administered 2015-02-17: 2 [IU] via SUBCUTANEOUS
  Administered 2015-02-17: 3 [IU] via SUBCUTANEOUS
  Administered 2015-02-18 (×2): 2 [IU] via SUBCUTANEOUS

## 2015-02-16 MED ORDER — SODIUM CHLORIDE 0.9 % IV SOLN
INTRAVENOUS | Status: DC
  Administered 2015-02-16 – 2015-02-17 (×2): 1000 mL via INTRAVENOUS

## 2015-02-16 MED ORDER — INSULIN ASPART 100 UNIT/ML ~~LOC~~ SOLN
0.0000 [IU] | Freq: Every day | SUBCUTANEOUS | Status: DC
  Administered 2015-02-17: 3 [IU] via SUBCUTANEOUS

## 2015-02-16 MED ORDER — MONTELUKAST SODIUM 10 MG PO TABS
10.0000 mg | ORAL_TABLET | Freq: Every day | ORAL | Status: DC
  Administered 2015-02-16 – 2015-02-18 (×3): 10 mg via ORAL
  Filled 2015-02-16 (×3): qty 1

## 2015-02-16 MED ORDER — SODIUM CHLORIDE 0.9 % IV BOLUS (SEPSIS)
1000.0000 mL | Freq: Once | INTRAVENOUS | Status: AC
  Administered 2015-02-16: 1000 mL via INTRAVENOUS

## 2015-02-16 MED ORDER — IPRATROPIUM-ALBUTEROL 0.5-2.5 (3) MG/3ML IN SOLN: 3.0000 mL | Freq: Four times a day (QID) | RESPIRATORY_TRACT | Status: DC | PRN

## 2015-02-16 MED ORDER — DEXTROSE 50 % IV SOLN
25.0000 mL | Freq: Once | INTRAVENOUS | Status: AC
  Administered 2015-02-16: 25 mL via INTRAVENOUS

## 2015-02-16 MED ORDER — LEVOFLOXACIN IN D5W 750 MG/150ML IV SOLN
750.0000 mg | Freq: Once | INTRAVENOUS | Status: AC
  Administered 2015-02-16: 750 mg via INTRAVENOUS
  Filled 2015-02-16: qty 150

## 2015-02-16 MED ORDER — SODIUM CHLORIDE 0.9 % IJ SOLN
3.0000 mL | Freq: Two times a day (BID) | INTRAMUSCULAR | Status: DC
  Administered 2015-02-17 – 2015-02-18 (×2): 3 mL via INTRAVENOUS

## 2015-02-16 MED ORDER — SERTRALINE HCL 50 MG PO TABS
150.0000 mg | ORAL_TABLET | Freq: Every day | ORAL | Status: DC
  Administered 2015-02-16 – 2015-02-18 (×3): 150 mg via ORAL
  Filled 2015-02-16 (×3): qty 3

## 2015-02-16 MED ORDER — DICYCLOMINE HCL 10 MG PO CAPS
10.0000 mg | ORAL_CAPSULE | Freq: Three times a day (TID) | ORAL | Status: DC
  Administered 2015-02-16 – 2015-02-18 (×7): 10 mg via ORAL
  Filled 2015-02-16 (×7): qty 1

## 2015-02-16 MED ORDER — ONDANSETRON HCL 4 MG/2ML IJ SOLN
4.0000 mg | Freq: Once | INTRAMUSCULAR | Status: AC
  Administered 2015-02-16: 4 mg via INTRAVENOUS
  Filled 2015-02-16: qty 2

## 2015-02-16 MED ORDER — HYDROCODONE-ACETAMINOPHEN 5-325 MG PO TABS
1.0000 | ORAL_TABLET | Freq: Once | ORAL | Status: AC
  Administered 2015-02-16: 1 via ORAL
  Filled 2015-02-16: qty 1

## 2015-02-16 MED ORDER — ONDANSETRON HCL 4 MG PO TABS
4.0000 mg | ORAL_TABLET | Freq: Four times a day (QID) | ORAL | Status: DC | PRN
Start: 2015-02-16 — End: 2015-02-19

## 2015-02-16 MED ORDER — DEXAMETHASONE SODIUM PHOSPHATE 4 MG/ML IJ SOLN
4.0000 mg | Freq: Two times a day (BID) | INTRAMUSCULAR | Status: DC
  Administered 2015-02-16 – 2015-02-17 (×2): 4 mg via INTRAVENOUS
  Filled 2015-02-16 (×2): qty 1

## 2015-02-16 MED ORDER — HYDROCODONE-ACETAMINOPHEN 10-325 MG PO TABS
1.0000 | ORAL_TABLET | ORAL | Status: DC | PRN
  Administered 2015-02-16 – 2015-02-17 (×3): 1 via ORAL
  Filled 2015-02-16 (×4): qty 1

## 2015-02-16 MED ORDER — IPRATROPIUM-ALBUTEROL 0.5-2.5 (3) MG/3ML IN SOLN
3.0000 mL | Freq: Three times a day (TID) | RESPIRATORY_TRACT | Status: DC
  Administered 2015-02-17 (×2): 3 mL via RESPIRATORY_TRACT
  Filled 2015-02-16 (×3): qty 3

## 2015-02-16 MED ORDER — LEVOFLOXACIN IN D5W 750 MG/150ML IV SOLN
750.0000 mg | INTRAVENOUS | Status: DC
  Administered 2015-02-16: 750 mg via INTRAVENOUS

## 2015-02-16 MED ORDER — KETOROLAC TROMETHAMINE 30 MG/ML IJ SOLN
30.0000 mg | Freq: Once | INTRAMUSCULAR | Status: AC
  Administered 2015-02-16: 30 mg via INTRAVENOUS
  Filled 2015-02-16: qty 1

## 2015-02-16 MED ORDER — ACETAMINOPHEN 650 MG RE SUPP: 650.0000 mg | Freq: Four times a day (QID) | RECTAL | Status: DC | PRN

## 2015-02-16 MED ORDER — ACETAMINOPHEN 325 MG PO TABS
650.0000 mg | ORAL_TABLET | Freq: Four times a day (QID) | ORAL | Status: DC | PRN
  Administered 2015-02-16: 650 mg via ORAL
  Filled 2015-02-16: qty 2

## 2015-02-16 MED ORDER — ONDANSETRON HCL 4 MG/2ML IJ SOLN
4.0000 mg | Freq: Four times a day (QID) | INTRAMUSCULAR | Status: DC | PRN
  Administered 2015-02-16: 4 mg via INTRAVENOUS
  Filled 2015-02-16: qty 2

## 2015-02-16 MED ORDER — CALCITONIN (SALMON) 200 UNIT/ACT NA SOLN
1.0000 | Freq: Every day | NASAL | Status: DC
  Administered 2015-02-16 – 2015-02-18 (×3): 1 via NASAL
  Filled 2015-02-16: qty 3.7

## 2015-02-16 MED ORDER — ALPRAZOLAM 1 MG PO TABS
1.0000 mg | ORAL_TABLET | Freq: Three times a day (TID) | ORAL | Status: DC | PRN
  Administered 2015-02-16 – 2015-02-17 (×3): 1 mg via ORAL
  Filled 2015-02-16 (×3): qty 1

## 2015-02-16 MED ORDER — IPRATROPIUM-ALBUTEROL 0.5-2.5 (3) MG/3ML IN SOLN
3.0000 mL | Freq: Four times a day (QID) | RESPIRATORY_TRACT | Status: DC
  Administered 2015-02-16: 3 mL via RESPIRATORY_TRACT
  Filled 2015-02-16: qty 3

## 2015-02-16 NOTE — ED Notes (Signed)
Generalized abdominal pain, N/V/D. Last vomited at 0148 this am, per pt. Pt also states cough and headache as well.

## 2015-02-16 NOTE — H&P (Signed)
[facility] History and Physical  LILLIAUNA VAN RRN:[id number] DOB: [birth year] DOA: 02/16/2015  Referring physician: zammit, ED PCP: Glo Herring., MD   Chief Complaint: Cough  HPI: Summer Cook is a 49 y.o. female who presents to the emergency room with a variety of complaints. She reports that over the last few days, she is developed worsening cough, sinus congestion, wheezing, nausea, vomiting and diarrhea. She reports that yesterday she had fevers and chills. When she checked her temperature was noted to be greater than 101. She reports a several family members in her household have similar symptoms, but not as severe. She's been unable to tolerate anything by mouth last 2 days. The patient has a history of ITP status post splenectomy. She is also chronically on steroids. She was evaluated in the emergency room and noted to be hypotensive, she had evidence of renal failure and dehydration. She'll be admitted for further treatments.   Review of Systems:  Pertinent positive as per history of present illness, otherwise negative  Past Medical History  Diagnosis Date  . Essential hypertension, benign   . 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  Last was Prednisone $RemoveBefore'40mg'[id number]$ ; 2 wk in 10/2012.  She also was on Prednisone bridged with Cellcept briefly but stopped due to lack of insurance.   . Depression   . Thrush, oral 05/29/2011  . Steroid-induced diabetes   . Pulmonary embolism 04/2012  . Asthma   . Renal insufficiency   . Chronic chest pain   . Normal cardiac stress test 02/2014  . Gout    Past Surgical History  Procedure Laterality Date  . Cholecystectomy  2008  . Bone marrow biopsy    . 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 Colonoscopy with esophagogastroduodenoscopy (egd) N/A 01/04/2013    Dr. Gala Romney: esophageal plaques with +KOH, hh. Gastric antrum abnormal, bx reactive  gastropathy. Anal canal hemorrhoids, colonic tics, normal TI, single polyp (sessile serrated tubular adenoma). Next TCS 12/2017  . Cataract extraction     Social History:  reports that she quit smoking about 6 years ago. Her smoking use included Cigarettes. She quit after 0 years of use. She has never used smokeless tobacco. She reports that she does not drink alcohol or use illicit drugs.  Allergies  Allergen Reactions  . Brintellix [Vortioxetine] Itching  . Yellow Jacket Venom Swelling  . Adhesive [Tape] Rash    Please use paper tape    Family History  Problem Relation Age of Onset  . Cervical cancer Mother   . Lung cancer Father   . Pneumonia Brother   . Cancer Paternal Grandmother 85    colon cancer, breast cancer  . Cancer Paternal Grandfather 69    colon cancer  . AAA (abdominal aortic aneurysm) Paternal Grandmother     Prior to Admission medications   Medication Sig Start Date End Date Taking? Authorizing Provider  ALPRAZolam Duanne Moron) 1 MG tablet Take 1 mg by mouth 3 (three) times daily as needed for anxiety.    Yes Historical Provider, MD  calcitonin, salmon, (MIACALCIN/FORTICAL) 200 UNIT/ACT nasal spray Place 1 spray into alternate nostrils daily.   Yes Historical Provider, MD  dexamethasone (DECADRON) 0.5 MG tablet Take 0.5 mg by mouth 2 (two) times daily.    Yes Historical Provider, MD  dicyclomine (BENTYL) 10 MG capsule Take 1 capsule (10 mg total) by mouth 4 (four) times daily -  before meals and at  bedtime. 01/24/15  Yes Carlis Stable, NP  diphenoxylate-atropine (LOMOTIL) 2.5-0.025 MG per tablet Take 1 tablet by mouth every 6 (six) hours as needed for diarrhea or loose stools.  01/15/15  Yes Historical Provider, MD  furosemide (LASIX) 20 MG tablet Take 20-40 mg by mouth 2 (two) times daily as needed for fluid.  12/16/14  Yes Historical Provider, MD  HYDROcodone-acetaminophen (NORCO) 10-325 MG per tablet Take 1 tablet by mouth every 4 (four) hours as needed for moderate pain.   01/28/15  Yes Historical Provider, MD  insulin aspart (NOVOLOG) 100 UNIT/ML injection Inject 2-5 Units into the skin daily as needed for high blood sugar. Per sliding scale.   Yes Historical Provider, MD  ipratropium-albuterol (DUONEB) 0.5-2.5 (3) MG/3ML SOLN Take 3 mLs by nebulization 3 (three) times daily as needed (shorntess of breath/wheezing).  03/22/13  Yes Historical Provider, MD  KLOR-CON 10 10 MEQ tablet Take 10 mEq by mouth as needed (Only takes with the furosemide).  11/13/14  Yes Historical Provider, MD  metoprolol succinate (TOPROL-XL) 50 MG 24 hr tablet Take 1 tablet (50 mg total) by mouth daily. 01/25/15  Yes Satira Sark, MD  montelukast (SINGULAIR) 10 MG tablet Take 10 mg by mouth daily.   Yes Historical Provider, MD  ondansetron (ZOFRAN) 4 MG tablet Take 1 tablet by mouth every 6 (six) hours as needed for nausea or vomiting.  01/27/15  Yes Historical Provider, MD  OVER THE COUNTER MEDICATION Place 1 drop into both eyes daily as needed (Dry Eyes).   Yes Historical Provider, MD  PROAIR RESPICLICK 947 (90 BASE) MCG/ACT AEPB Inhale 2 puffs into the lungs every 4 (four) hours as needed (Shortness of breath).  12/27/14  Yes Historical Provider, MD  sertraline (ZOLOFT) 50 MG tablet Take 150 mg by mouth daily.    Yes Historical Provider, MD   Physical Exam: Filed Vitals:   02/16/15 1300 02/16/15 1330 02/16/15 1404 02/16/15 1430  BP: $Re'98/71 91/66 72/50 'LJG$ 96/43  Pulse: 93 100 103 98  Temp:   98.7 F (37.1 C)   TempSrc:   Oral   Resp: $Remo'18  18 20  'ZIpNP$ Height:      Weight:      SpO2: 100% 86% 91% 95%    Wt Readings from Last 3 Encounters:  02/16/15 100.245 kg (221 lb)  01/25/15 100.699 kg (222 lb)  01/24/15 102.422 kg (225 lb 12.8 oz)    General:  Patient is laying in bed, appears comfortable Eyes: PERRL, normal lids, irises & conjunctiva ENT: mucous membranes are dry Neck: no LAD, masses or thyromegaly Cardiovascular: RRR, no m/r/g. No LE edema. Telemetry: SR, no arrhythmias    Respiratory: CTA bilaterally, no w/r/r. Normal respiratory effort. Abdomen: soft, ntnd, bs+ Skin: no rash or induration seen on limited exam Musculoskeletal: grossly normal tone BUE/BLE Psychiatric: grossly normal mood and affect, speech fluent and appropriate Neurologic: grossly non-focal.          Labs on Admission:  Basic Metabolic Panel:  Recent Labs Lab 02/16/15 0954  NA 136  K 4.3  CL 101  CO2 23  GLUCOSE 92  BUN 16  CREATININE 1.84*  CALCIUM 9.7   Liver Function Tests:  Recent Labs Lab 02/16/15 0954  AST 48*  ALT 37  ALKPHOS 78  BILITOT 1.0  PROT 7.8  ALBUMIN 4.2   No results for input(s): LIPASE, AMYLASE in the last 168 hours. No results for input(s): AMMONIA in the last 168 hours. CBC:  Recent Labs Lab 02/16/15  0954  WBC 10.1  NEUTROABS 5.4  HGB 17.7*  HCT 55.1*  MCV 88.0  PLT 64*   Cardiac Enzymes: No results for input(s): CKTOTAL, CKMB, CKMBINDEX, TROPONINI in the last 168 hours.  BNP (last 3 results)  Recent Labs  10/22/14 1040 10/24/14 1912  BNP 40.0 54.3    ProBNP (last 3 results)  Recent Labs  07/27/14 1427  PROBNP 114.9    CBG: No results for input(s): GLUCAP in the last 168 hours.  Radiological Exams on Admission: Dg Chest Portable 1 View  02/16/2015   CLINICAL DATA:  49 year old female with shortness of breath, cough, fever and wheezing  EXAM: PORTABLE CHEST - 1 VIEW  COMPARISON:  Prior chest x-ray 01/19/2015  FINDINGS: Low inspiratory volumes. Otherwise, no focal airspace consolidation, pleural effusion or pneumothorax. No evidence of pulmonary edema. Stable cardiac and mediastinal contours. Osseous structures intact and unremarkable for age.  IMPRESSION: No acute cardiopulmonary process.   Electronically Signed   By: Jacqulynn Cadet M.D.   On: 02/16/2015 10:52    Assessment/Plan Active Problems:   ITP (idiopathic thrombocytopenic purpura)   Nausea with vomiting   Dehydration   Acute renal failure    Hypotension   Bronchitis   Asthma, chronic   Anxiety state   1. Hypotension. Likely related to dehydration. We'll continue with aggressive IV hydration. She may have an element of adrenal insufficiency since she is steroid dependent. We'll start her on stress dose steroids. 2. Acute bronchitis. Chest x-ray does not show any evidence of pneumonia. We'll start her empirically on Levaquin. We'll also check influenza PCR. 3. Nausea, vomiting and diarrhea. Appears to be associated with a viral illness. Continue supportive treatment. Start on clear liquids and advance diet as tolerated. There are function tests are unremarkable. 4. Asthma. Continue on Singulair and bronchodilators. 5. Thrombocytopenia with a history of ITP. She doesn't have any evidence of bleeding/bruising at this time. Continue to follow platelets. Continue steroids. 6. Dehydration. Continue IV hydration. 7. Acute renal failure. Likely related to dehydration. Continue IV fluids and follow labs. 8. Anxiety. Continue benzodiazepines.    Code Status: full code DVT Prophylaxis: scd Family Communication: discussed with patient Disposition Plan: discharge home once improved  Time spent: 24mins  MEMON,JEHANZEB [facility] Pager [phone]

## 2015-02-16 NOTE — ED Notes (Signed)
MD Zammit at bedside. 

## 2015-02-16 NOTE — ED Notes (Signed)
Pt c/o of increased headache. MD Zammit made aware.

## 2015-02-16 NOTE — Progress Notes (Signed)
CRITICAL VALUE ALERT  Critical value received:  CBG 49  Date of notification:  02/16/2015  Time of notification:  1800  Critical value read back: yes  Nurse who received alert:  Sharen Hones  MD notified (1st page):  Dr. Roderic Palau  Time of first page:  Notified him personnally  MD notified (2nd page):  Time of second page:  Responding MD:  Roderic Palau  Time MD responded:   [id number]  25cc D50 given due to the patient being nauseated.  Rechecked CBG in approx 15 mins and the CBG was 115.  Dr. Roderic Palau was made aware.

## 2015-02-16 NOTE — ED Provider Notes (Signed)
CSN: [id number]     Arrival date & time 02/16/15  0930 History  This chart was scribed for Milton Ferguson, MD by Starleen Arms, ED Scribe. This patient was seen in room APA12/APA12 and the patient's care was started at 9:43 AM.   Chief Complaint  Patient presents with  . Abdominal Pain   The history is provided by the patient. No language interpreter was used.   HPI Comments: Summer Cook is a 49 y.o. female who presents to the Emergency Department complaining of abdominal pain onset several days ago with associated cough, headache, nausea, 4 episodes of emesis today, and > 4 episodes diarrhea today.  She reports her aunt and daughter have had similar symptoms with the exception of fever.  Patient does not smoke.    Gastroenterologist: Dr. Gala Romney Past Medical History  Diagnosis Date  . Essential hypertension, benign   . 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  Last was Prednisone 53m; 2 wk in 10/2012.  She also was on Prednisone bridged with Cellcept briefly but stopped due to lack of insurance.   . Depression   . Thrush, oral 05/29/2011  . Steroid-induced diabetes   . Pulmonary embolism 04/2012  . Asthma   . Renal insufficiency   . Chronic chest pain   . Normal cardiac stress test 02/2014  . Gout    Past Surgical History  Procedure Laterality Date  . Cholecystectomy  2008  . Bone marrow biopsy    . 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 Colonoscopy with esophagogastroduodenoscopy (egd) N/A 01/04/2013    Dr. RGala Romney esophageal plaques with +KOH, hh. Gastric antrum abnormal, bx reactive gastropathy. Anal canal hemorrhoids, colonic tics, normal TI, single polyp (sessile serrated tubular adenoma). Next TCS 12/2017  . Cataract extraction     Family History  Problem Relation Age of Onset  . Cervical cancer Mother   . Lung cancer Father   . Pneumonia Brother   . Cancer Paternal  Grandmother 831   colon cancer, breast cancer  . Cancer Paternal Grandfather 610   colon cancer  . AAA (abdominal aortic aneurysm) Paternal Grandmother    History  Substance Use Topics  . Smoking status: Former Smoker -- 0 years    Types: Cigarettes    Quit date: 02/14/2009  . Smokeless tobacco: Never Used  . Alcohol Use: No   OB History    No data available     Review of Systems  Constitutional: Positive for fever. Negative for appetite change and fatigue.  HENT: Negative for congestion, ear discharge and sinus pressure.   Eyes: Negative for discharge.  Respiratory: Positive for cough.   Cardiovascular: Negative for chest pain.  Gastrointestinal: Positive for nausea, vomiting, abdominal pain and diarrhea.  Genitourinary: Negative for frequency and hematuria.  Musculoskeletal: Negative for back pain.  Skin: Negative for rash.  Neurological: Positive for headaches. Negative for seizures.  Psychiatric/Behavioral: Negative for hallucinations.      Allergies  Brintellix; Yellow jacket venom; and Adhesive  Home Medications   Prior to Admission medications   Medication Sig Start Date End Date Taking? Authorizing Provider  ALPRAZolam (Duanne Moron 1 MG tablet Take 1 mg by mouth 3 (three) times daily as needed for anxiety.     Historical Provider, MD  calcitonin, salmon, (MIACALCIN/FORTICAL) 200 UNIT/ACT nasal spray Place 1 spray into alternate nostrils daily.    Historical Provider, MD  dexamethasone (DECADRON) 0.5 MG tablet  Take 0.5 mg by mouth 2 (two) times daily.     Historical Provider, MD  dicyclomine (BENTYL) 10 MG capsule Take 1 capsule (10 mg total) by mouth 4 (four) times daily -  before meals and at bedtime. 01/24/15   Eric A Gill, NP  diphenoxylate-atropine (LOMOTIL) 2.5-0.025 MG per tablet Take 1 tablet by mouth every 6 (six) hours as needed. 01/15/15   Historical Provider, MD  furosemide (LASIX) 20 MG tablet Take 20-40 mg by mouth 2 (two) times daily as needed. 12/16/14    Historical Provider, MD  insulin aspart (NOVOLOG) 100 UNIT/ML injection Inject 2-5 Units into the skin daily as needed for high blood sugar. Per sliding scale.    Historical Provider, MD  ipratropium-albuterol (DUONEB) 0.5-2.5 (3) MG/3ML SOLN Take 3 mLs by nebulization 3 (three) times daily as needed (shorntess of breath/wheezing).  03/22/13   Historical Provider, MD  KLOR-CON 10 10 MEQ tablet Take 10 mEq by mouth as needed (Only takes with the furosemide).  11/13/14   Historical Provider, MD  metoprolol succinate (TOPROL-XL) 50 MG 24 hr tablet Take 1 tablet (50 mg total) by mouth daily. 01/25/15   Samuel G McDowell, MD  montelukast (SINGULAIR) 10 MG tablet Take 10 mg by mouth daily.    Historical Provider, MD  OVER THE COUNTER MEDICATION Place 1 drop into both eyes daily as needed (Dry Eyes).    Historical Provider, MD  PROAIR RESPICLICK 108 (90 BASE) MCG/ACT AEPB Inhale 2 puffs into the lungs every 4 (four) hours as needed (Shortness of breath).  12/27/14   Historical Provider, MD  sertraline (ZOLOFT) 50 MG tablet Take 150 mg by mouth daily.     Historical Provider, MD   LMP 05/13/2011 Physical Exam  Constitutional: She is oriented to person, place, and time. She appears well-developed.  HENT:  Head: Normocephalic.  Eyes: Conjunctivae and EOM are normal. No scleral icterus.  Neck: Neck supple. No thyromegaly present.  Cardiovascular: Normal rate and regular rhythm.  Exam reveals no gallop and no friction rub.   No murmur heard. Pulmonary/Chest: No stridor. She has no wheezes. She has no rales. She exhibits no tenderness.  Patient denies few crackles in lungs bilaterally.   Abdominal: She exhibits no distension. There is no tenderness. There is no rebound.  Musculoskeletal: Normal range of motion. She exhibits no edema.  Lymphadenopathy:    She has no cervical adenopathy.  Neurological: She is oriented to person, place, and time. She exhibits normal muscle tone. Coordination normal.  Skin: No  rash noted. No erythema.  Psychiatric: She has a normal mood and affect. Her behavior is normal.  Nursing note and vitals reviewed.   ED Course  Procedures (including critical care time)  DIAGNOSTIC STUDIES: Oxygen Saturation is 96% on RA, normal by my interpretation.    COORDINATION OF CARE:  9:48 AM Discussed treatment plan with patient at bedside.  Patient acknowledges and agrees with plan.    Labs Review Labs Reviewed - No data to display  Imaging Review No results found.   EKG Interpretation None      MDM   Final diagnoses:  None     Bronchitis,  Dehydration,  Admit for fluids and antibiotics  The chart was scribed for me under my direct supervision.  I personally performed the history, physical, and medical decision making and all procedures in the evaluation of this patient..    , MD 02/16/15 1444 

## 2015-02-17 ENCOUNTER — Inpatient Hospital Stay (HOSPITAL_COMMUNITY): Payer: Medicare Other

## 2015-02-17 DIAGNOSIS — F411 Generalized anxiety disorder: Secondary | ICD-10-CM

## 2015-02-17 DIAGNOSIS — I959 Hypotension, unspecified: Secondary | ICD-10-CM

## 2015-02-17 LAB — URINALYSIS, ROUTINE W REFLEX MICROSCOPIC
Glucose, UA: NEGATIVE mg/dL
Ketones, ur: 15 mg/dL — AB
Leukocytes, UA: NEGATIVE
Nitrite: NEGATIVE
PH: 5 (ref 5.0–8.0)
Protein, ur: 30 mg/dL — AB
Specific Gravity, Urine: >1.03 — ABNORMAL HIGH (ref 1.005–1.030)
UROBILINOGEN UA: 0.2 mg/dL (ref 0.0–1.0)

## 2015-02-17 LAB — GLUCOSE, CAPILLARY
GLUCOSE-CAPILLARY: 122 mg/dL — AB (ref 65–99)
Glucose-Capillary: 102 mg/dL — ABNORMAL HIGH (ref 65–99)
Glucose-Capillary: 105 mg/dL — ABNORMAL HIGH (ref 65–99)
Glucose-Capillary: 168 mg/dL — ABNORMAL HIGH (ref 65–99)
Glucose-Capillary: 258 mg/dL — ABNORMAL HIGH (ref 65–99)

## 2015-02-17 LAB — BASIC METABOLIC PANEL
ANION GAP: 11 (ref 5–15)
ANION GAP: 6 (ref 5–15)
BUN: 21 mg/dL — AB (ref 6–20)
BUN: 22 mg/dL — ABNORMAL HIGH (ref 6–20)
CALCIUM: 8.7 mg/dL — AB (ref 8.9–10.3)
CHLORIDE: 110 mmol/L (ref 101–111)
CHLORIDE: 116 mmol/L — AB (ref 101–111)
CO2: 17 mmol/L — AB (ref 22–32)
CO2: 17 mmol/L — ABNORMAL LOW (ref 22–32)
CREATININE: 1.93 mg/dL — AB (ref 0.44–1.00)
Calcium: 8.6 mg/dL — ABNORMAL LOW (ref 8.9–10.3)
Creatinine, Ser: 2.11 mg/dL — ABNORMAL HIGH (ref 0.44–1.00)
GFR calc Af Amer: 31 mL/min — ABNORMAL LOW (ref >60)
GFR calc Af Amer: 34 mL/min — ABNORMAL LOW (ref >60)
GFR calc non Af Amer: 29 mL/min — ABNORMAL LOW (ref >60)
GFR, EST NON AFRICAN AMERICAN: 26 mL/min — AB (ref >60)
GLUCOSE: 133 mg/dL — AB (ref 65–99)
Glucose, Bld: 199 mg/dL — ABNORMAL HIGH (ref 65–99)
Potassium: 5.4 mmol/L — ABNORMAL HIGH (ref 3.5–5.1)
Potassium: 4.3 mmol/L (ref 3.5–5.1)
Sodium: 138 mmol/L (ref 135–145)
Sodium: 139 mmol/L (ref 135–145)

## 2015-02-17 LAB — CBC
HCT: 47.5 % — ABNORMAL HIGH (ref 36.0–46.0)
HEMOGLOBIN: 14.6 g/dL (ref 12.0–15.0)
MCH: 27.4 pg (ref 26.0–34.0)
MCHC: 30.7 g/dL (ref 30.0–36.0)
MCV: 89.1 fL (ref 78.0–100.0)
Platelets: 73 10*3/uL — ABNORMAL LOW (ref 150–400)
RBC: 5.33 MIL/uL — AB (ref 3.87–5.11)
RDW: 15.6 % — ABNORMAL HIGH (ref 11.5–15.5)
WBC: 8.5 10*3/uL (ref 4.0–10.5)

## 2015-02-17 LAB — URINE MICROSCOPIC-ADD ON

## 2015-02-17 LAB — CLOSTRIDIUM DIFFICILE BY PCR: Toxigenic C. Difficile by PCR: NEGATIVE

## 2015-02-17 MED ORDER — SODIUM CHLORIDE 0.45 % IV SOLN
INTRAVENOUS | Status: DC
  Administered 2015-02-17: 1000 mL via INTRAVENOUS
  Administered 2015-02-18: 05:00:00 via INTRAVENOUS

## 2015-02-17 MED ORDER — SODIUM POLYSTYRENE SULFONATE 15 GM/60ML PO SUSP
15.0000 g | Freq: Once | ORAL | Status: AC
  Administered 2015-02-17: 15 g via ORAL
  Filled 2015-02-17: qty 60

## 2015-02-17 MED ORDER — LEVOFLOXACIN IN D5W 750 MG/150ML IV SOLN: 750.0000 mg | INTRAVENOUS | Status: DC

## 2015-02-17 MED ORDER — DIPHENOXYLATE-ATROPINE 2.5-0.025 MG PO TABS
2.0000 | ORAL_TABLET | Freq: Once | ORAL | Status: AC
  Administered 2015-02-18: 2 via ORAL
  Filled 2015-02-17: qty 2

## 2015-02-17 MED ORDER — DEXAMETHASONE SODIUM PHOSPHATE 4 MG/ML IJ SOLN
2.0000 mg | Freq: Two times a day (BID) | INTRAMUSCULAR | Status: DC
  Administered 2015-02-17 – 2015-02-18 (×2): 2 mg via INTRAVENOUS
  Filled 2015-02-17 (×2): qty 1

## 2015-02-17 NOTE — Progress Notes (Signed)
[facility] PROGRESS NOTE  IVIS LAWWILL [id number] DOB: 1966/01/22 DOA: 02/16/2015 PCP: Glo Herring., MD  Assessment/Plan: 1. Hypotension. Likely related to dehydration. Improving with IV fluids. May be an element of adrenal insufficiency. She was started on stress dose steroids which will be decreased. 2. Acute bronchitis. No evidence of pneumonia on chest x-ray. Started on Levaquin. Influenza PCR is negative. Clinically she is improving. Continue bronchodilators. 3. Nausea, vomiting and diarrhea. Possibly related to viral illness. C. difficile was negative. Continue supportive measures. Advanced diet as tolerated . 4. Asthma. Continue Singulair bronchodilators.  5. Thrombocytopenia with history of ITP. Platelets appear to be stable. Continue current treatments. 6. Acute renal failure. Creatinine mildly worse today. Continue IV fluids. Check renal ultrasound 7. Anxiety. Continue benzodiazepines  Code Status: full code Family Communication: discussed with patient Disposition Plan: discharge home once improved   Consultants:    Procedures:    Antibiotics:  Levofloxacin 5/21>>  HPI/Subjective: Feeling better. Cough/congestion and overall breathing improving. Still had some vomiting and loose stools overnight.  Objective: Filed Vitals:   02/17/15 0445  BP: 109/72  Pulse: 88  Temp: 98 F (36.7 C)  Resp: 20    Intake/Output Summary (Last 24 hours) at 02/17/15 1132 Last data filed at 02/17/15 0800  Gross per 24 hour  Intake   1500 ml  Output      0 ml  Net   1500 ml   Filed Weights   02/16/15 0942  Weight: 100.245 kg (221 lb)    Exam:   General:  NAD  Cardiovascular: S1, S2 RRR  Respiratory: cta b  Abdomen: soft, nt, nd, bs+  Musculoskeletal: no edema b/l   Data Reviewed: Basic Metabolic Panel:  Recent Labs Lab 02/16/15 0954 02/17/15 0718  NA 136 138  K 4.3 5.4*  CL 101 110  CO2 23 17*  GLUCOSE 92 133*  BUN 16 21*   CREATININE 1.84* 2.11*  CALCIUM 9.7 8.6*   Liver Function Tests:  Recent Labs Lab 02/16/15 0954  AST 48*  ALT 37  ALKPHOS 78  BILITOT 1.0  PROT 7.8  ALBUMIN 4.2   No results for input(s): LIPASE, AMYLASE in the last 168 hours. No results for input(s): AMMONIA in the last 168 hours. CBC:  Recent Labs Lab 02/16/15 0954 02/17/15 0718  WBC 10.1 8.5  NEUTROABS 5.4  --   HGB 17.7* 14.6  HCT 55.1* 47.5*  MCV 88.0 89.1  PLT 64* 73*   Cardiac Enzymes: No results for input(s): CKTOTAL, CKMB, CKMBINDEX, TROPONINI in the last 168 hours. BNP (last 3 results)  Recent Labs  10/22/14 1040 10/24/14 1912  BNP 40.0 54.3    ProBNP (last 3 results)  Recent Labs  07/27/14 1427  PROBNP 114.9    CBG:  Recent Labs Lab 02/16/15 1802 02/16/15 1843 02/16/15 2057 02/17/15 0014 02/17/15 0735  GLUCAP 49* 115* 73 105* 122*    Recent Results (from the past 240 hour(s))  Blood culture (routine x 2)     Status: None (Preliminary result)   Collection Time: 02/16/15  2:38 PM  Result Value Ref Range Status   Specimen Description BLOOD RIGHT HAND  Final   Special Requests BOTTLES DRAWN AEROBIC AND ANAEROBIC 8CC BOTTLES  Final   Culture NO GROWTH 1 DAY  Final   Report Status PENDING  Incomplete  Blood culture (routine x 2)     Status: None (Preliminary result)   Collection Time: 02/16/15  2:39 PM  Result Value Ref Range Status  Specimen Description BLOOD LEFT HAND  Final   Special Requests BOTTLES DRAWN AEROBIC AND ANAEROBIC 6CC  BOTTLES  Final   Culture NO GROWTH 1 DAY  Final   Report Status PENDING  Incomplete     Studies: Dg Chest Portable 1 View  02/16/2015   CLINICAL DATA:  49 year old female with shortness of breath, cough, fever and wheezing  EXAM: PORTABLE CHEST - 1 VIEW  COMPARISON:  Prior chest x-ray 01/19/2015  FINDINGS: Low inspiratory volumes. Otherwise, no focal airspace consolidation, pleural effusion or pneumothorax. No evidence of pulmonary edema. Stable  cardiac and mediastinal contours. Osseous structures intact and unremarkable for age.  IMPRESSION: No acute cardiopulmonary process.   Electronically Signed   By: Jacqulynn Cadet M.D.   On: 02/16/2015 10:52    Scheduled Meds: . calcitonin (salmon)  1 spray Alternating Nares Daily  . dexamethasone  4 mg Intravenous Q12H  . dicyclomine  10 mg Oral TID AC & HS  . insulin aspart  0-15 Units Subcutaneous TID WC  . insulin aspart  0-5 Units Subcutaneous QHS  . ipratropium-albuterol  3 mL Nebulization TID  . levofloxacin (LEVAQUIN) IV  750 mg Intravenous Q24H  . montelukast  10 mg Oral Daily  . sertraline  150 mg Oral Daily  . sodium chloride  3 mL Intravenous Q12H  . sodium polystyrene  15 g Oral Once   Continuous Infusions: . sodium chloride 1,000 mL (02/16/15 1745)    Active Problems:   ITP (idiopathic thrombocytopenic purpura)   Nausea with vomiting   Dehydration   Acute renal failure   Hypotension   Bronchitis   Asthma, chronic   Anxiety state    Time spent: 60mins    MEMON,JEHANZEB  [facility] Pager [phone]. If 7PM-7AM, please contact night-coverage at [email or web address], password [facility] 02/17/2015, 11:32 AM  LOS: 1 day

## 2015-02-17 NOTE — Progress Notes (Signed)
Utilization review Completed Alberta Cairns RN BSN   

## 2015-02-18 LAB — GLUCOSE, CAPILLARY
GLUCOSE-CAPILLARY: 124 mg/dL — AB (ref 65–99)
Glucose-Capillary: 130 mg/dL — ABNORMAL HIGH (ref 65–99)
Glucose-Capillary: 87 mg/dL (ref 65–99)

## 2015-02-18 LAB — HEMOGLOBIN A1C
Hgb A1c MFr Bld: 6.2 % — ABNORMAL HIGH (ref 4.8–5.6)
Mean Plasma Glucose: 131 mg/dL

## 2015-02-18 LAB — BASIC METABOLIC PANEL
Anion gap: 8 (ref 5–15)
BUN: 20 mg/dL (ref 6–20)
CALCIUM: 8.6 mg/dL — AB (ref 8.9–10.3)
CO2: 19 mmol/L — ABNORMAL LOW (ref 22–32)
Chloride: 112 mmol/L — ABNORMAL HIGH (ref 101–111)
Creatinine, Ser: 1.5 mg/dL — ABNORMAL HIGH (ref 0.44–1.00)
GFR calc Af Amer: 46 mL/min — ABNORMAL LOW (ref >60)
GFR calc non Af Amer: 40 mL/min — ABNORMAL LOW (ref >60)
GLUCOSE: 152 mg/dL — AB (ref 65–99)
POTASSIUM: 4.2 mmol/L (ref 3.5–5.1)
Sodium: 139 mmol/L (ref 135–145)

## 2015-02-18 LAB — CBC
HCT: 41.8 % (ref 36.0–46.0)
Hemoglobin: 13.4 g/dL (ref 12.0–15.0)
MCH: 28.1 pg (ref 26.0–34.0)
MCHC: 32.1 g/dL (ref 30.0–36.0)
MCV: 87.6 fL (ref 78.0–100.0)
PLATELETS: 54 10*3/uL — AB (ref 150–400)
RBC: 4.77 MIL/uL (ref 3.87–5.11)
RDW: 15.5 % (ref 11.5–15.5)
WBC: 8.2 10*3/uL (ref 4.0–10.5)

## 2015-02-18 MED ORDER — IPRATROPIUM-ALBUTEROL 0.5-2.5 (3) MG/3ML IN SOLN: 3.0000 mL | Freq: Three times a day (TID) | RESPIRATORY_TRACT | Status: DC | PRN

## 2015-02-18 MED ORDER — ZOLPIDEM TARTRATE 5 MG PO TABS
5.0000 mg | ORAL_TABLET | Freq: Once | ORAL | Status: AC
Start: 2015-02-18 — End: 2015-02-18
  Administered 2015-02-18: 5 mg via ORAL
  Filled 2015-02-18: qty 1

## 2015-02-18 MED ORDER — LEVOFLOXACIN 750 MG PO TABS: 750.0000 mg | ORAL_TABLET | Freq: Every day | ORAL | Status: DC

## 2015-02-18 MED ORDER — LEVOFLOXACIN IN D5W 750 MG/150ML IV SOLN
750.0000 mg | INTRAVENOUS | Status: DC
  Filled 2015-02-18: qty 150

## 2015-02-18 NOTE — Progress Notes (Signed)
Patient requesting time home oxygen would be delivered with anticipating discharge. Called the on call number listed  for [facility] and was told they will be giving me a call back. Number to nurses station was provided and patient informed. Will continue to monitor and update patient.

## 2015-02-18 NOTE — Progress Notes (Signed)
Patient enquired if she could walk to ground floor.  I voiced to her that it was not a good idea since she did not have h er home O2.  She verbalized understanding.

## 2015-02-18 NOTE — Care Management Note (Signed)
Case Management Note  Patient Details  Name: Summer Cook MRN: [id number] Date of Birth: 26-Jan-1966  Expected Discharge Date:                  Expected Discharge Plan:  Home/Self Care  In-House Referral:  NA  Discharge planning Services  CM Consult  Post Acute Care Choice:    Choice offered to:     DME Arranged:  Oxygen DME Agency:     HH Arranged:    HH Agency:     Status of Service:  Completed, signed off  Medicare Important Message Given:    Date Medicare IM Given:    Medicare IM give by:    Date Additional Medicare IM Given:    Additional Medicare Important Message give by:     If discussed at [place] of Stay Meetings, dates discussed:    Additional Comments: Notified by RN, pt re-checked for home O2 assessment. Pt does NOT meet requirements for home O2. [place] notified, MD notified. No CM needs prior to DC. Sherald Barge, RN 02/18/2015, 4:46 PM

## 2015-02-18 NOTE — Progress Notes (Signed)
SATURATION QUALIFICATIONS: (This note is used to comply with regulatory documentation for home oxygen)  Patient Saturations on Room Air at Rest = 92%  Patient Saturations on Room Air while Ambulating = 85%  Patient Saturations on 2 Liters of oxygen while Ambulating = 96%  Please briefly explain why patient needs home oxygen: Patient will need O2 due to she is unable to have active activities without becoming short of breath.

## 2015-02-18 NOTE — Progress Notes (Signed)
Informed by nursing that patient now meeting requirements for home O2. Instructed RN to fax all necessary info to [facility] after information documented. Spoke with Terrence Dupont, of [facility] and she will begin DME referral and [facility] will deliver O2 this evening once documentation is completed and faxed.

## 2015-02-18 NOTE — Progress Notes (Signed)
Notified [facility] to find out the status of the patients home O2 delivery.  Spoke with Marlowe Kays and she stated that the fax of information had been received and that they were working on the order at this time.  She stated that it will probably take a couple hours before the O2 can be delivered.  I asked her what number could be called to check on the status after the office closed.  She stated the oncall number is still the same. [phone].  I will notify the MD and the patient.

## 2015-02-18 NOTE — Discharge Summary (Signed)
Physician Discharge Summary  Summer Cook [id number] DOB: 1966/06/08 DOA: 02/16/2015  PCP: Glo Herring., MD  Admit date: 02/16/2015 Discharge date: 02/18/2015  Time spent: 40 minutes  Recommendations for Outpatient Follow-up:  1. Follow-up with primary care physician 1-2 weeks 2. Follow-up with hematology in 2 weeks.  Discharge Diagnoses:  Active Problems:   ITP (idiopathic thrombocytopenic purpura)   Nausea with vomiting   Dehydration   Acute renal failure   Hypotension   Bronchitis   Asthma, chronic   Anxiety state   Discharge Condition: improved  Diet recommendation: low salt  Filed Weights   02/16/15 0942  Weight: 100.245 kg (221 lb)    History of present illness:  Summer Cook is a 49 y.o. female who presents to the emergency room with a variety of complaints. She reports that over the last few days, she is developed worsening cough, sinus congestion, wheezing, nausea, vomiting and diarrhea. She reports that yesterday she had fevers and chills. When she checked her temperature was noted to be greater than 101. She reports a several family members in her household have similar symptoms, but not as severe. She's been unable to tolerate anything by mouth last 2 days. The patient has a history of ITP status post splenectomy. She is also chronically on steroids. She was evaluated in the emergency room and noted to be hypotensive, she had evidence of renal failure and dehydration. She'll be admitted for further treatments.  Hospital Course:  1. Hypotension. Likely related to dehydration. Improved with IV fluids. There may have been an element of adrenal insufficiency. She was started on stress dose steroids which will be decreased to her home dose of Decadron. 2. Acute bronchitis. No evidence of pneumonia on chest x-ray. Started on Levaquin. Influenza PCR is negative. She was continued on bronchodilators and has improved. She does require some oxygen on  ambulation. 3. Nausea, vomiting and diarrhea. Possibly related to viral illness. C. difficile was negative. Improved with Imodium. She is tolerating a solid diet without difficulty.. 4. Asthma. Continue Singulair bronchodilators.  5. Thrombocytopenia with history of ITP. Platelets appear to be stable. Discussed with hematology team. They recommend follow-up with them. Hematology reports that they had advised the patient to discontinue her Decadron a while ago. Patient had continued to take Decadron despite these recommendations. They have advised that the patient follow-up with Hematology for further management of ITP. Since she is chronically on steroids, we will continue her on her current dose of Decadron until she follows up with hematology. 6. Acute renal failure. Renal ultrasound negative. Improved with IV fluids. Likely related to dehydration. 7. Anxiety. Continue benzodiazepines  Patient is a bowing of the hall without difficulty. She is tolerating a solid diet and is requesting discharge home.  Procedures:    Consultations:    Discharge Exam: Filed Vitals:   02/18/15 1300  BP: 138/120  Pulse: 96  Temp: 98.1 F (36.7 C)  Resp: 20    General: NAD Cardiovascular: S1, S2 RRR Respiratory: CTA B  Discharge Instructions   Discharge Instructions    Diet - low sodium heart healthy    Complete by:  As directed      Increase activity slowly    Complete by:  As directed           Current Discharge Medication List    START taking these medications   Details  levofloxacin (LEVAQUIN) 750 MG tablet Take 1 tablet (750 mg total) by mouth daily. Qty: 4 tablet, Refills:  0      CONTINUE these medications which have CHANGED   Details  ipratropium-albuterol (DUONEB) 0.5-2.5 (3) MG/3ML SOLN Take 3 mLs by nebulization 3 (three) times daily as needed (shorntess of breath/wheezing). Qty: 360 mL, Refills: 0   Associated Diagnoses: ITP (idiopathic thrombocytopenic purpura)       CONTINUE these medications which have NOT CHANGED   Details  ALPRAZolam (XANAX) 1 MG tablet Take 1 mg by mouth 3 (three) times daily as needed for anxiety.    Associated Diagnoses: ITP (idiopathic thrombocytopenic purpura); PE (pulmonary embolism)    calcitonin, salmon, (MIACALCIN/FORTICAL) 200 UNIT/ACT nasal spray Place 1 spray into alternate nostrils daily.    dexamethasone (DECADRON) 0.5 MG tablet Take 0.5 mg by mouth 2 (two) times daily.     dicyclomine (BENTYL) 10 MG capsule Take 1 capsule (10 mg total) by mouth 4 (four) times daily -  before meals and at bedtime. Qty: 120 capsule, Refills: 0    diphenoxylate-atropine (LOMOTIL) 2.5-0.025 MG per tablet Take 1 tablet by mouth every 6 (six) hours as needed for diarrhea or loose stools.  Refills: 0    furosemide (LASIX) 20 MG tablet Take 20-40 mg by mouth 2 (two) times daily as needed for fluid.  Refills: 2    HYDROcodone-acetaminophen (NORCO) 10-325 MG per tablet Take 1 tablet by mouth every 4 (four) hours as needed for moderate pain.  Refills: 0    insulin aspart (NOVOLOG) 100 UNIT/ML injection Inject 2-5 Units into the skin daily as needed for high blood sugar. Per sliding scale.    KLOR-CON 10 10 MEQ tablet Take 10 mEq by mouth as needed (Only takes with the furosemide).  Refills: 2    metoprolol succinate (TOPROL-XL) 50 MG 24 hr tablet Take 1 tablet (50 mg total) by mouth daily. Qty: 30 tablet, Refills: 6    montelukast (SINGULAIR) 10 MG tablet Take 10 mg by mouth daily.    ondansetron (ZOFRAN) 4 MG tablet Take 1 tablet by mouth every 6 (six) hours as needed for nausea or vomiting.  Refills: 0    OVER THE COUNTER MEDICATION Place 1 drop into both eyes daily as needed (Dry Eyes).    PROAIR RESPICLICK 123XX123 (90 BASE) MCG/ACT AEPB Inhale 2 puffs into the lungs every 4 (four) hours as needed (Shortness of breath).  Refills: 3    sertraline (ZOLOFT) 50 MG tablet Take 150 mg by mouth daily.        Allergies  Allergen  Reactions  . Brintellix [Vortioxetine] Itching  . Yellow Jacket Venom Swelling  . Adhesive [Tape] Rash    Please use paper tape      The results of significant diagnostics from this hospitalization (including imaging, microbiology, ancillary and laboratory) are listed below for reference.    Significant Diagnostic Studies: Dg Chest 2 View  01/19/2015   CLINICAL DATA:  New diarrhea and dehydration  EXAM: CHEST  2 VIEW  COMPARISON:  10/22/2014  FINDINGS: The heart size and mediastinal contours are within normal limits. Both lungs are clear. The visualized skeletal structures are unremarkable.  IMPRESSION: No active cardiopulmonary disease.   Electronically Signed   By: Franchot Gallo M.D.   On: 01/19/2015 20:34   US Renal  02/17/2015   CLINICAL DATA:  Acute renal failure  EXAM: RENAL / URINARY TRACT ULTRASOUND COMPLETE  COMPARISON:  CT abdomen 01/19/2015  FINDINGS: Right Kidney:  Length: 11.8 cm. Echogenicity within normal limits. No mass or hydronephrosis visualized.  Left Kidney:  Length: 11.1 cm.  Echogenicity within normal limits. No mass or hydronephrosis visualized.  Bladder:  Appears normal for degree of bladder distention.  IMPRESSION: Negative renal ultrasound   Electronically Signed   By: Franchot Gallo M.D.   On: 02/17/2015 13:13   Dg Chest Portable 1 View  02/16/2015   CLINICAL DATA:  49 year old female with shortness of breath, cough, fever and wheezing  EXAM: PORTABLE CHEST - 1 VIEW  COMPARISON:  Prior chest x-ray 01/19/2015  FINDINGS: Low inspiratory volumes. Otherwise, no focal airspace consolidation, pleural effusion or pneumothorax. No evidence of pulmonary edema. Stable cardiac and mediastinal contours. Osseous structures intact and unremarkable for age.  IMPRESSION: No acute cardiopulmonary process.   Electronically Signed   By: Jacqulynn Cadet M.D.   On: 02/16/2015 10:52    Microbiology: Recent Results (from the past 240 hour(s))  Blood culture (routine x 2)     Status:  None (Preliminary result)   Collection Time: 02/16/15  2:38 PM  Result Value Ref Range Status   Specimen Description BLOOD RIGHT HAND  Final   Special Requests BOTTLES DRAWN AEROBIC AND ANAEROBIC 8CC BOTTLES  Final   Culture NO GROWTH 2 DAYS  Final   Report Status PENDING  Incomplete  Blood culture (routine x 2)     Status: None (Preliminary result)   Collection Time: 02/16/15  2:39 PM  Result Value Ref Range Status   Specimen Description BLOOD LEFT HAND  Final   Special Requests BOTTLES DRAWN AEROBIC AND ANAEROBIC 6CC  BOTTLES  Final   Culture NO GROWTH 2 DAYS  Final   Report Status PENDING  Incomplete  Clostridium Difficile by PCR     Status: None   Collection Time: 02/17/15  1:25 PM  Result Value Ref Range Status   C difficile by pcr NEGATIVE NEGATIVE Final     Labs: Basic Metabolic Panel:  Recent Labs Lab 02/16/15 0954 02/17/15 0718 02/17/15 1525 02/18/15 0514  NA 136 138 139 139  K 4.3 5.4* 4.3 4.2  CL 101 110 116* 112*  CO2 23 17* 17* 19*  GLUCOSE 92 133* 199* 152*  BUN 16 21* 22* 20  CREATININE 1.84* 2.11* 1.93* 1.50*  CALCIUM 9.7 8.6* 8.7* 8.6*   Liver Function Tests:  Recent Labs Lab 02/16/15 0954  AST 48*  ALT 37  ALKPHOS 78  BILITOT 1.0  PROT 7.8  ALBUMIN 4.2   No results for input(s): LIPASE, AMYLASE in the last 168 hours. No results for input(s): AMMONIA in the last 168 hours. CBC:  Recent Labs Lab 02/16/15 0954 02/17/15 0718 02/18/15 0514  WBC 10.1 8.5 8.2  NEUTROABS 5.4  --   --   HGB 17.7* 14.6 13.4  HCT 55.1* 47.5* 41.8  MCV 88.0 89.1 87.6  PLT 64* 73* 54*   Cardiac Enzymes: No results for input(s): CKTOTAL, CKMB, CKMBINDEX, TROPONINI in the last 168 hours. BNP: BNP (last 3 results)  Recent Labs  10/22/14 1040 10/24/14 1912  BNP 40.0 54.3    ProBNP (last 3 results)  Recent Labs  07/27/14 1427  PROBNP 114.9    CBG:  Recent Labs Lab 02/17/15 1629 02/17/15 2207 02/18/15 0759 02/18/15 1302 02/18/15 1653   GLUCAP 168* 258* 124* 130* 87       Signed:  MEMON,JEHANZEB  [facility] 02/18/2015, 7:01 PM

## 2015-02-18 NOTE — Care Management Note (Signed)
Case Management Note  Patient Details  Name: Summer Cook MRN: [id number] Date of Birth: [birth year]  Expected Discharge Date:                  Expected Discharge Plan:  Home/Self Care  In-House Referral:  NA  Discharge planning Services  CM Consult  Post Acute Care Choice:  Durable Medical Equipment Choice offered to:  Patient  DME Arranged:  Oxygen DME Agency:  [place]:    [facility] Agency:     Status of Service:  Completed, signed off  Medicare Important Message Given:    Date Medicare IM Given:    Medicare IM give by:    Date Additional Medicare IM Given:    Additional Medicare Important Message give by:     If discussed at [place] of Stay Meetings, dates discussed:    Additional Comments: Pt is from home, lives with children. Pt has neb machine and cane at home. Pt independent at baseline with ADL's and has no HH services. Pt plans to discharge home with self care. Pt meets requirements for home O2. Pt would like [facility]. Pt plans to DC home today. [facility] notified of DME needs and faxed orders and notes. Pt will discharge once port O2 tank is delivered. Pt plans has no further CM needs.   Sherald Barge, RN 02/18/2015, 4:37 PM

## 2015-02-19 LAB — GI PATHOGEN PANEL BY PCR, STOOL
C difficile toxin A/B: NOT DETECTED
Campylobacter by PCR: NOT DETECTED
Cryptosporidium by PCR: NOT DETECTED
E COLI (ETEC) LT/ST: NOT DETECTED
E coli (STEC): NOT DETECTED
E coli 0157 by PCR: NOT DETECTED
G LAMBLIA BY PCR: NOT DETECTED
Norovirus GI/GII: NOT DETECTED
ROTAVIRUS A BY PCR: NOT DETECTED
SALMONELLA BY PCR: NOT DETECTED
SHIGELLA BY PCR: NOT DETECTED

## 2015-02-19 NOTE — Progress Notes (Signed)
[facility] delivered oxygen for transport and discharge instructions were reviewed. Patient aware of follow up appointment that needed to be made with her primary care provider, dietary considerations , and new prescriptions. Patient aware of oxygen use and setting. IV was removed during previous shift and site was clean and dry. Patient was in stable condition when leaving the floor with staff.

## 2015-02-21 LAB — CULTURE, BLOOD (ROUTINE X 2)
CULTURE: NO GROWTH
Culture: NO GROWTH

## 2015-03-22 DIAGNOSIS — G894 Chronic pain syndrome: Secondary | ICD-10-CM | POA: Diagnosis not present

## 2015-03-22 DIAGNOSIS — Z6835 Body mass index (BMI) 35.0-35.9, adult: Secondary | ICD-10-CM | POA: Diagnosis not present

## 2015-03-22 DIAGNOSIS — E669 Obesity, unspecified: Secondary | ICD-10-CM | POA: Diagnosis not present

## 2015-03-22 DIAGNOSIS — Z1389 Encounter for screening for other disorder: Secondary | ICD-10-CM | POA: Diagnosis not present

## 2015-03-22 DIAGNOSIS — M4806 Spinal stenosis, lumbar region: Secondary | ICD-10-CM | POA: Diagnosis not present

## 2015-03-22 DIAGNOSIS — F419 Anxiety disorder, unspecified: Secondary | ICD-10-CM | POA: Diagnosis not present

## 2015-03-22 DIAGNOSIS — E6609 Other obesity due to excess calories: Secondary | ICD-10-CM | POA: Diagnosis not present

## 2015-03-22 DIAGNOSIS — R109 Unspecified abdominal pain: Secondary | ICD-10-CM | POA: Diagnosis not present

## 2015-03-25 ENCOUNTER — Other Ambulatory Visit: Payer: Self-pay

## 2015-03-28 ENCOUNTER — Other Ambulatory Visit: Payer: Self-pay | Admitting: Obstetrics and Gynecology

## 2015-04-09 ENCOUNTER — Encounter: Payer: Self-pay | Admitting: Gastroenterology

## 2015-06-03 IMAGING — CR DG CHEST 2V
2 series · 2 of 2 positions shown · non-contrast
Comparison: 05/23/2013

CLINICAL DATA: Chest pain

EXAM:
CHEST  2 VIEW

[view not recorded (1 of 2)]
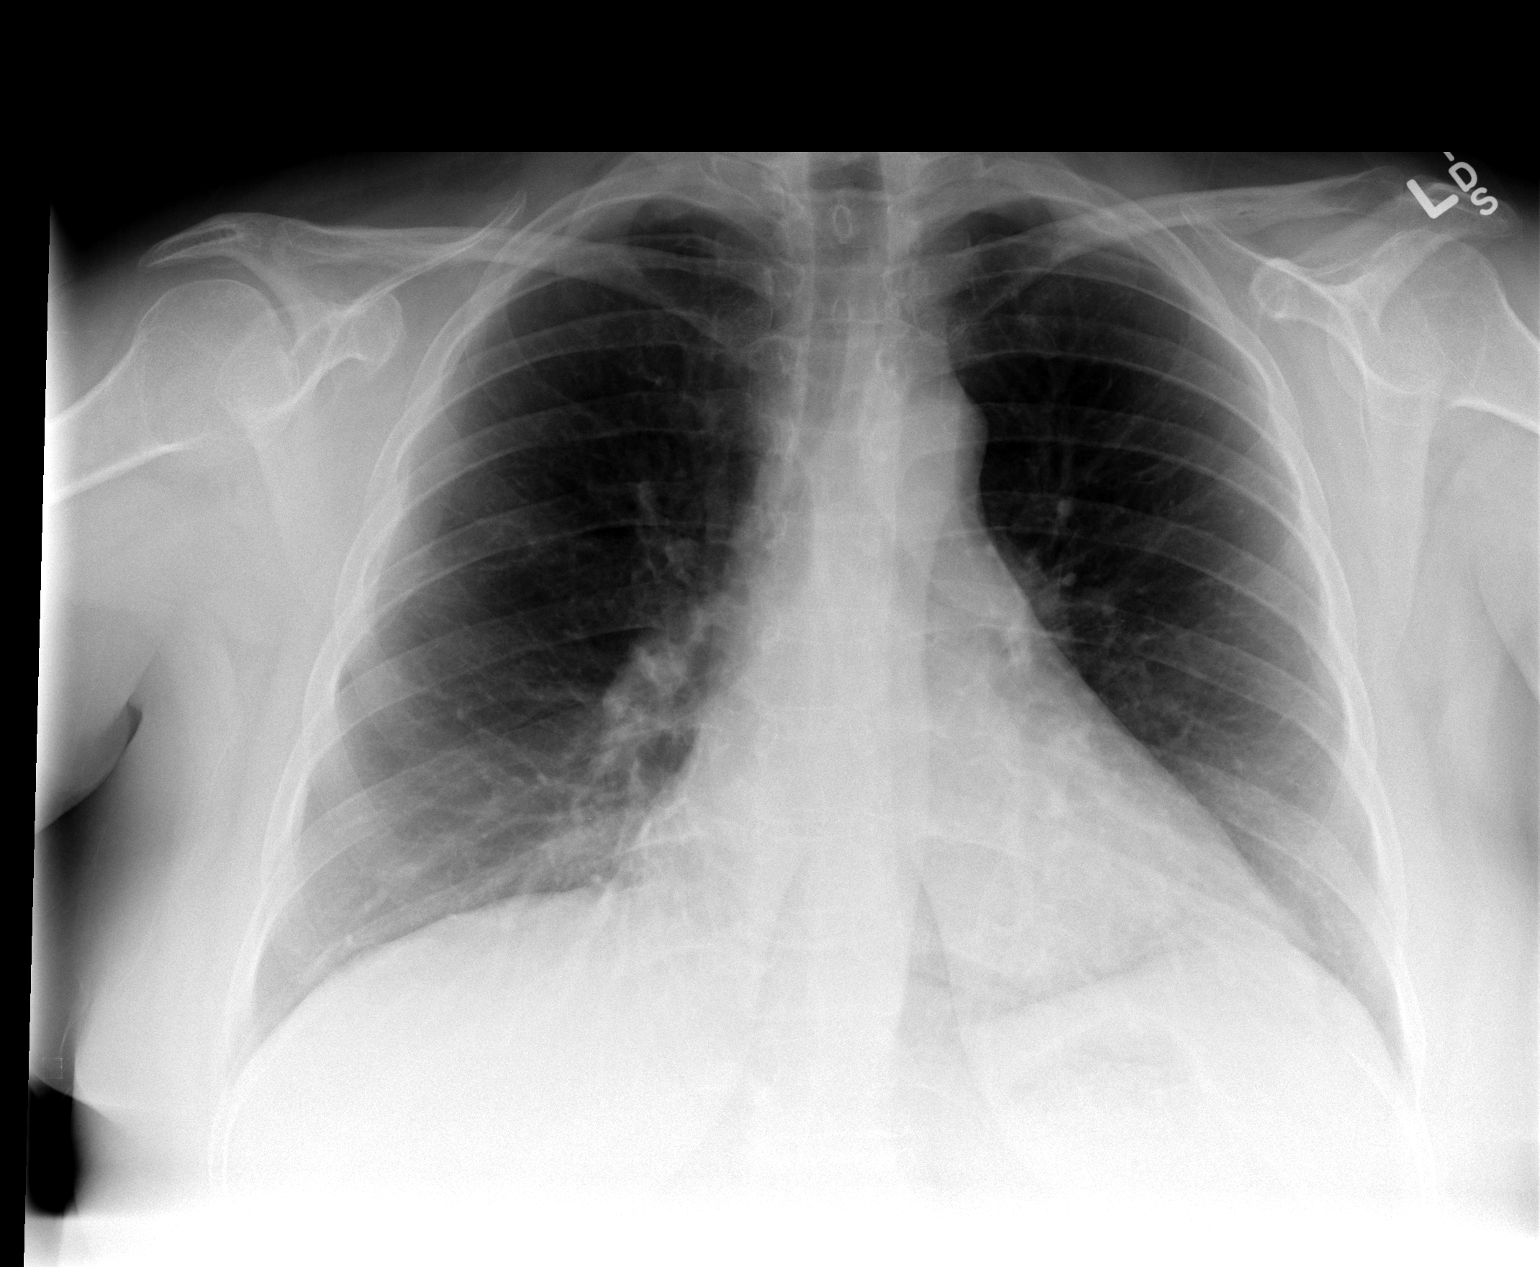

[view not recorded (2 of 2)]
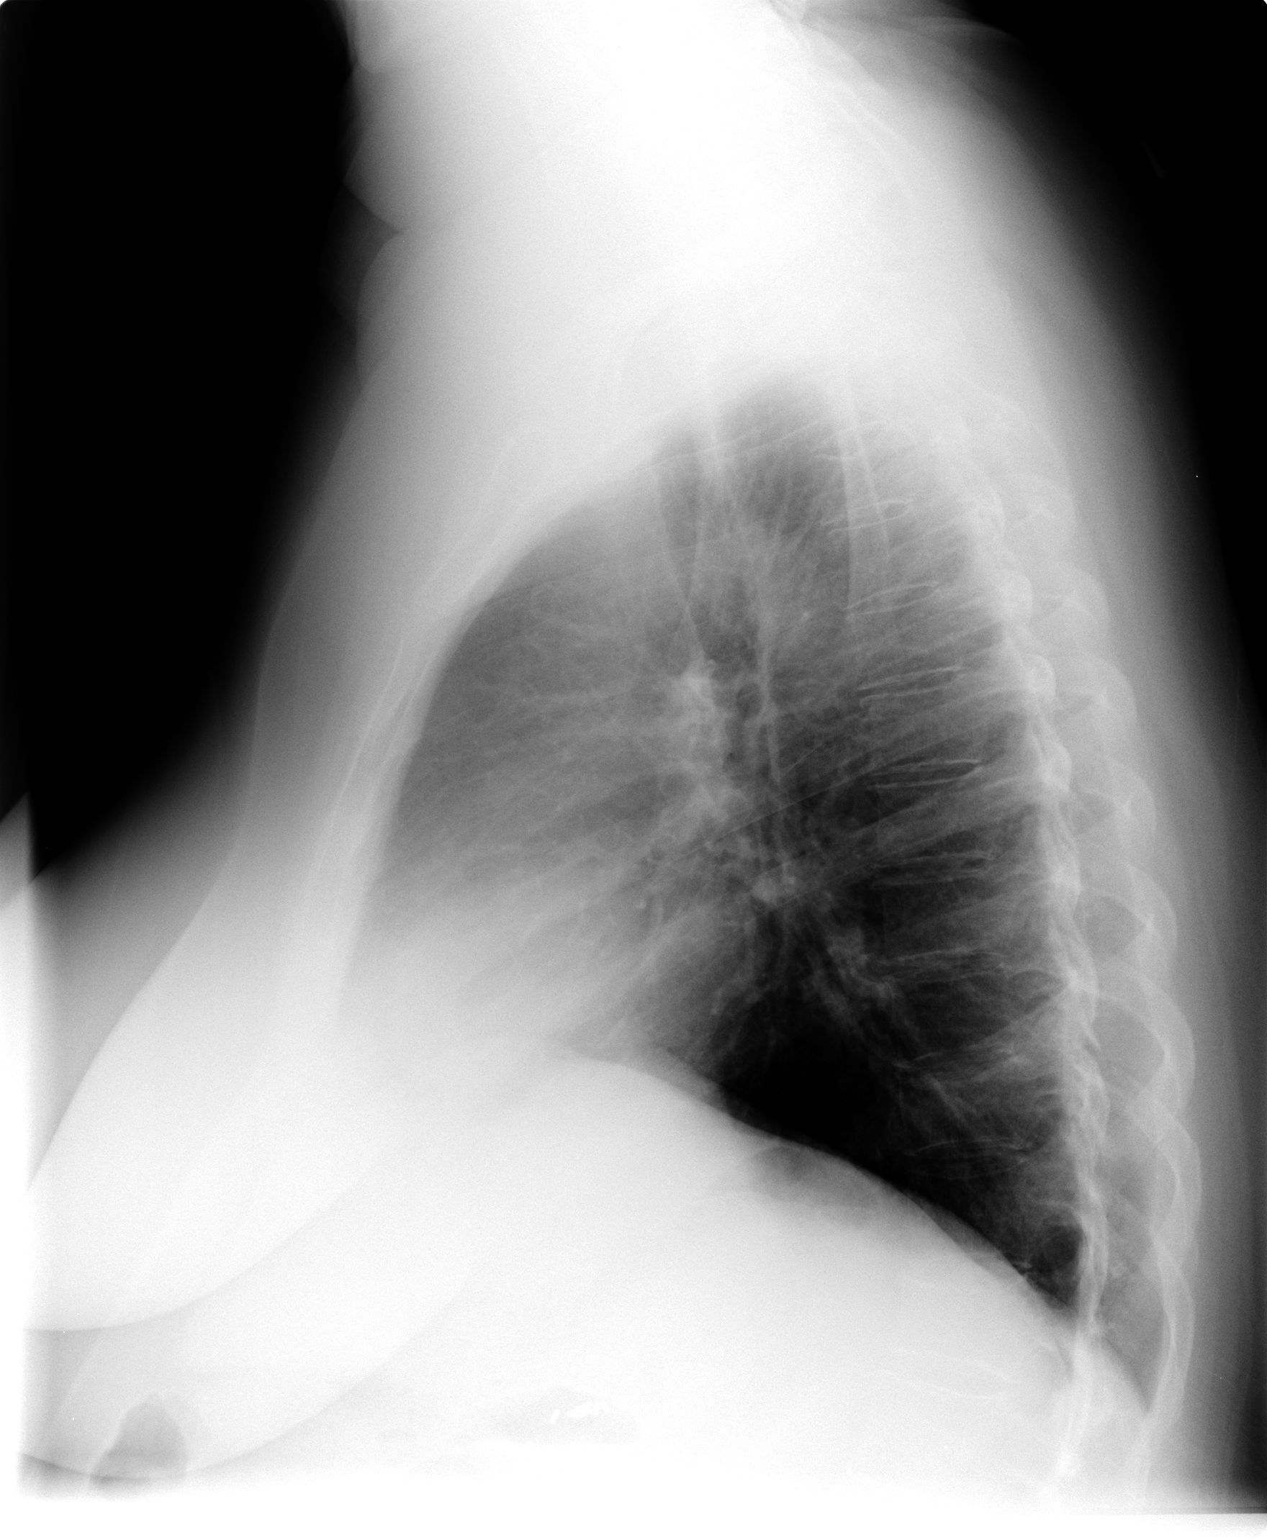

[2 of 2 positions shown; findings below may reference images not displayed]

FINDINGS: Cardiac enlargement. Normal vascularity. Lungs are clear without
infiltrate or mass. Chronic compression fracture approximately L1 is
unchanged.
IMPRESSION: No active cardiopulmonary disease.

## 2015-06-10 DIAGNOSIS — Z23 Encounter for immunization: Secondary | ICD-10-CM | POA: Diagnosis not present

## 2015-06-12 IMAGING — CR DG LUMBAR SPINE COMPLETE 4+V
5 series · 5 of 5 positions shown · non-contrast
Comparison: CT ABD/PELV WO CM dated 01/21/2013

CLINICAL DATA: Back pain.

EXAM:
LUMBAR SPINE - COMPLETE 4+ VIEW

[view not recorded (1 of 5)]
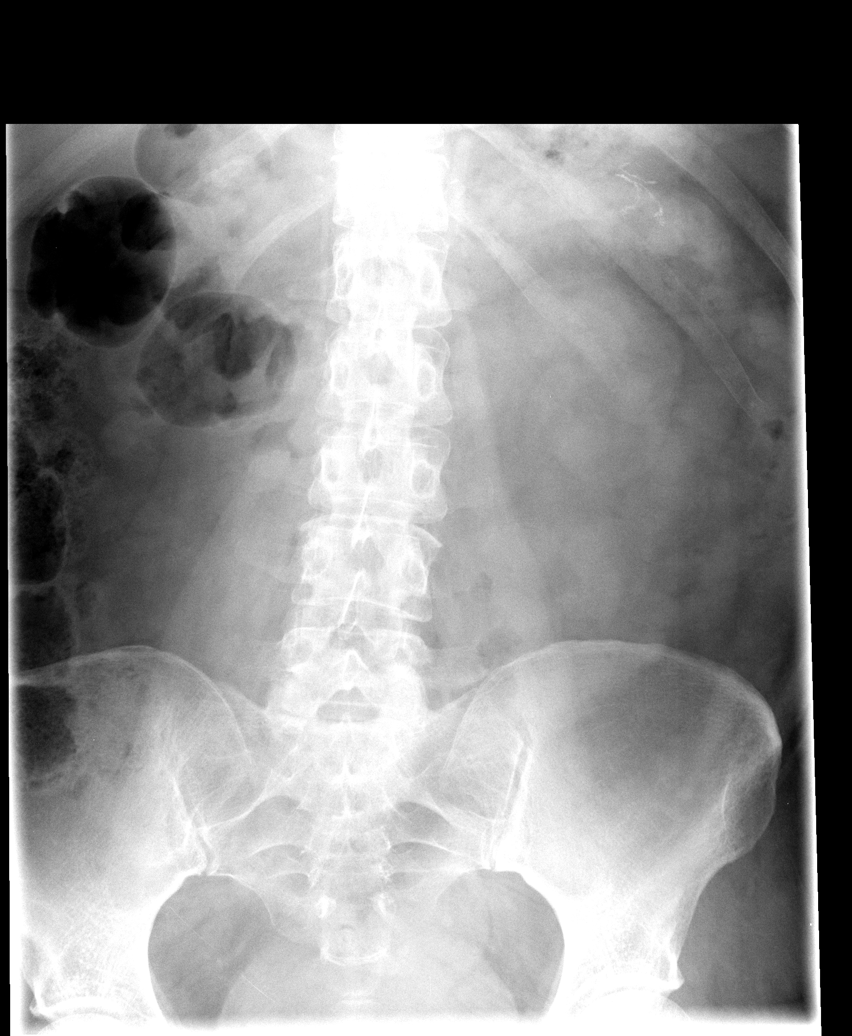

[view not recorded (2 of 5)]
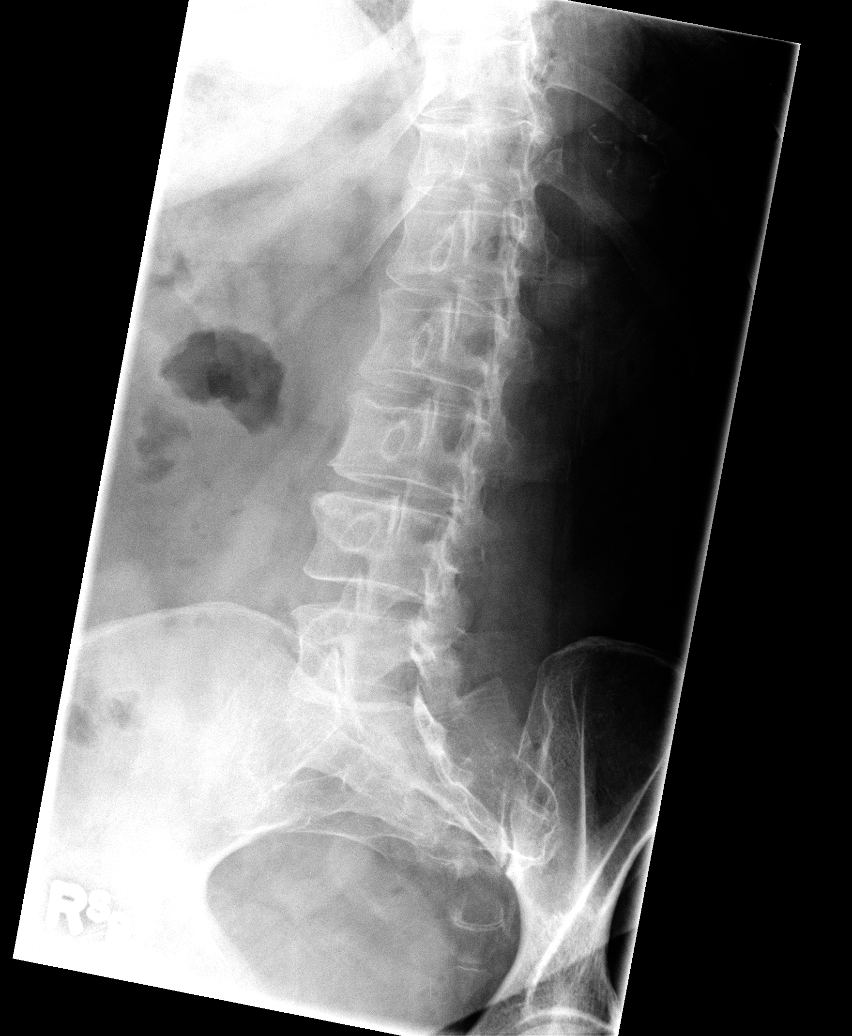

[view not recorded (3 of 5)]
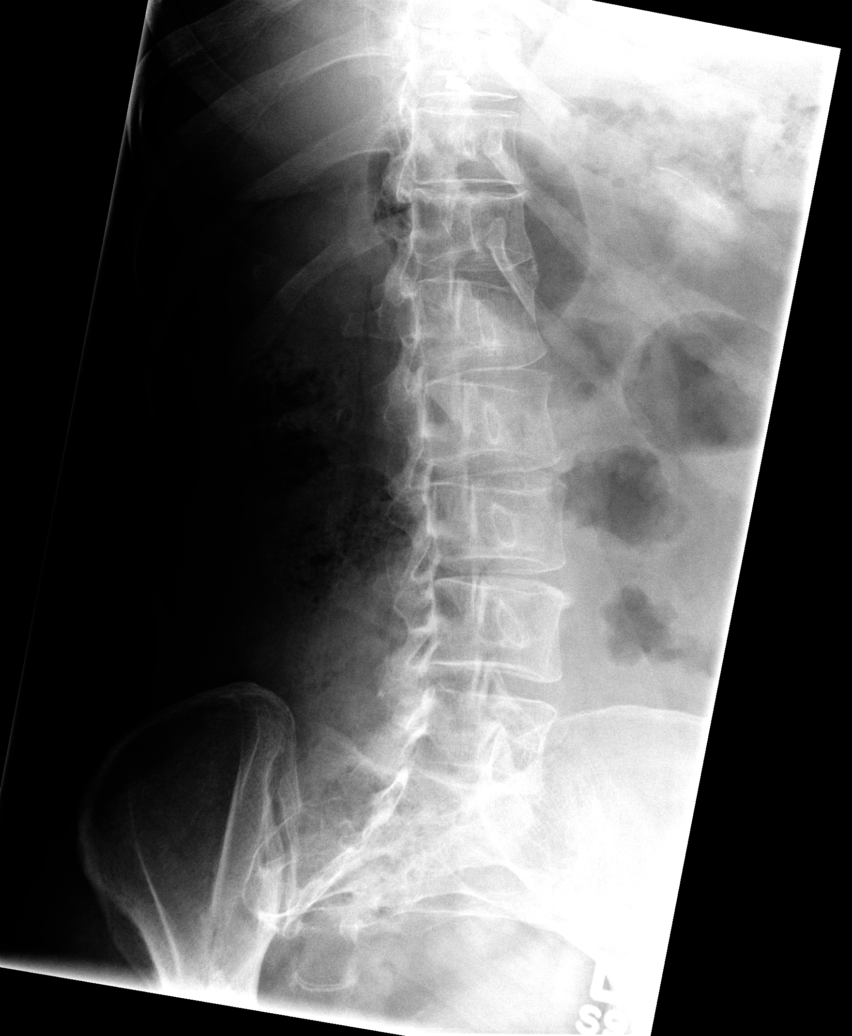

[view not recorded (4 of 5)]
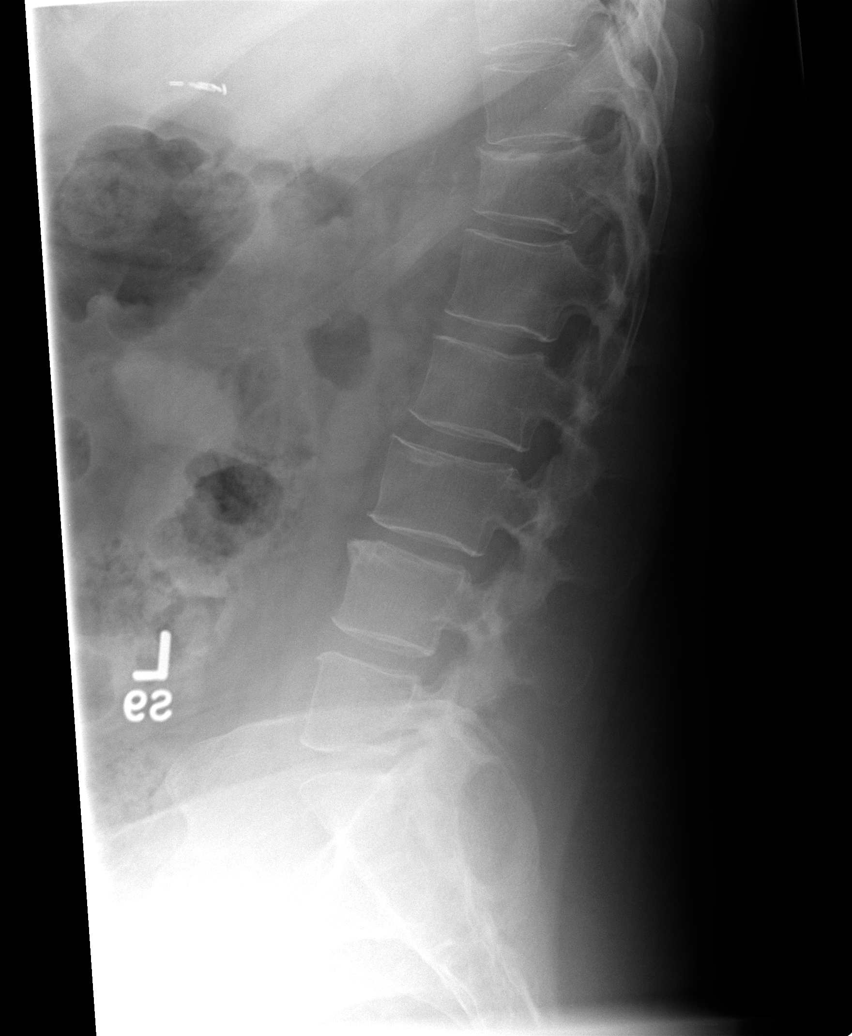

[view not recorded (5 of 5)]
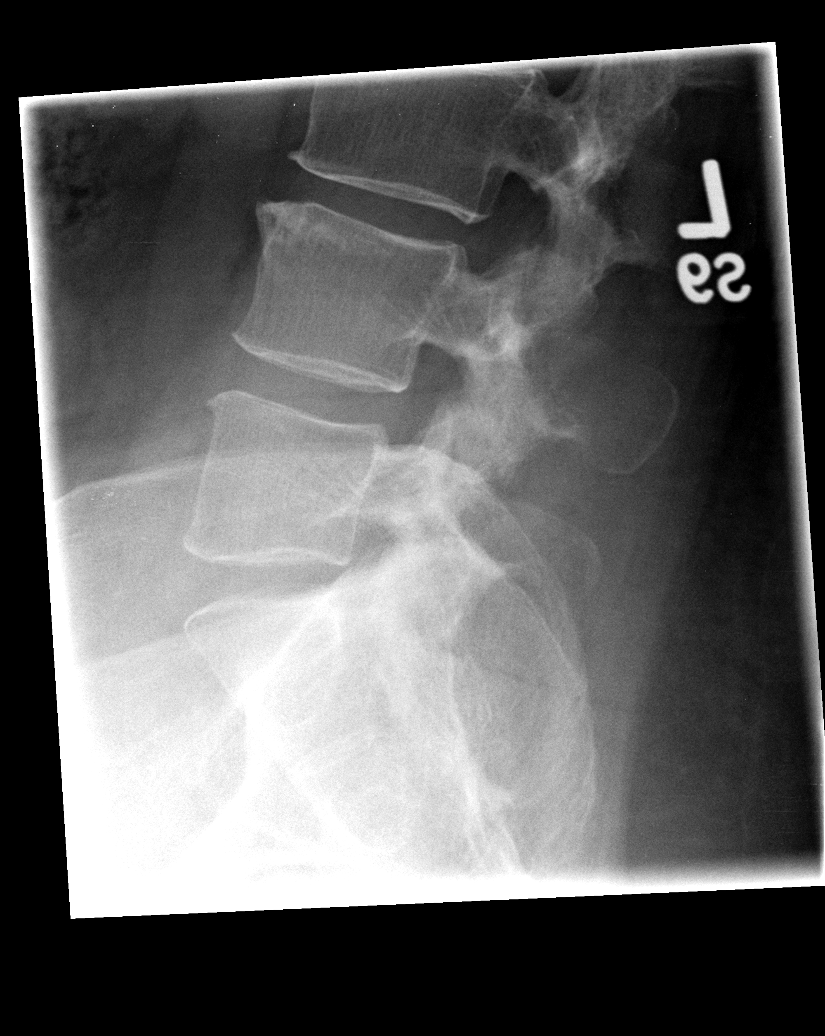

[5 of 5 positions shown; findings below may reference images not displayed]

FINDINGS: There is no evidence of acute fracture or dislocation. Chronic
compression deformity appreciated involving T12. Mild degenerative
disc disease changes at L3-4 which appears stable from prior CT.
IMPRESSION: No acute osseous abnormalities.

## 2015-06-12 IMAGING — CT CT ANGIO CHEST
1 of 6 series · 5 of 36 positions shown · IV contrast (omnipaque)
Comparison: Chest CT April 05, 2013 and chest radiograph December 26, 2013

CLINICAL DATA: Pain

EXAM:
CT ANGIOGRAPHY CHEST WITH CONTRAST
TECHNIQUE: Multidetector CT imaging of the chest was performed using the
standard protocol during bolus administration of intravenous
contrast. Multiplanar CT image reconstructions and MIPs were
obtained to evaluate the vascular anatomy.
CONTRAST:  80mL OMNIPAQUE IOHEXOL 300 MG/ML  SOLN

[Series 5: pe 3.0 b40f · axial · 0.62mm/px · z∈[-371,-191]mm · 5 of 91 slices shown]
[im 16/91  lung]
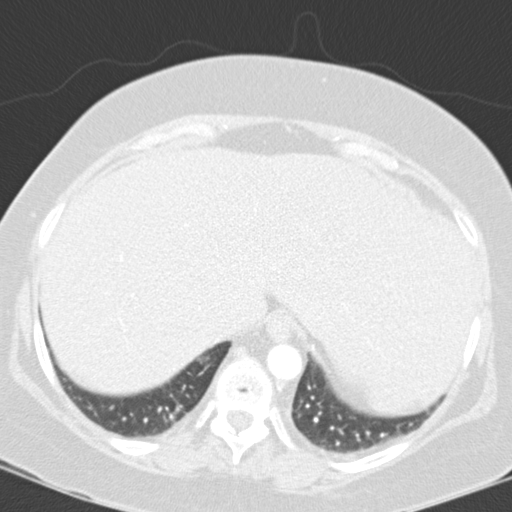
[im 31/91  mediastinal]
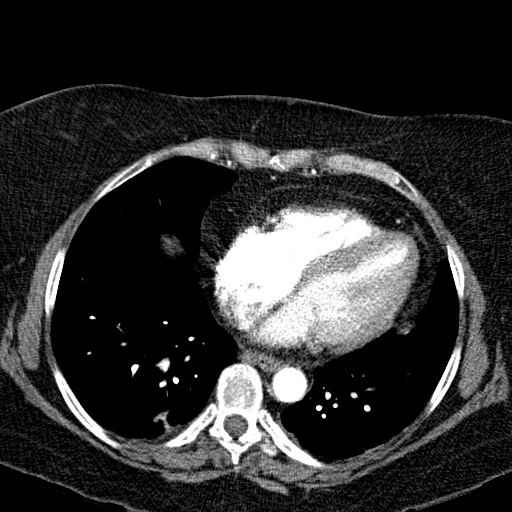
[im 46/91  lung]
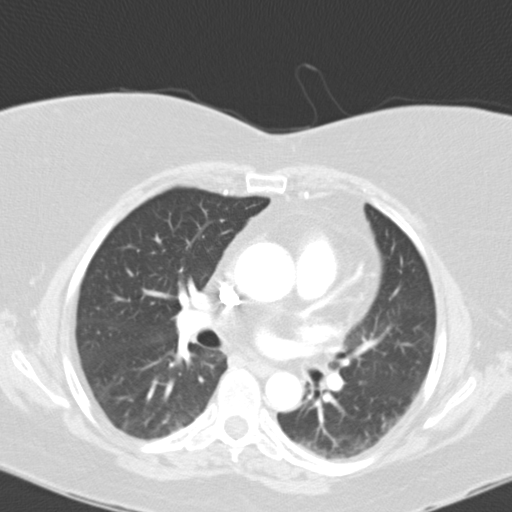
[im 61/91  mediastinal]
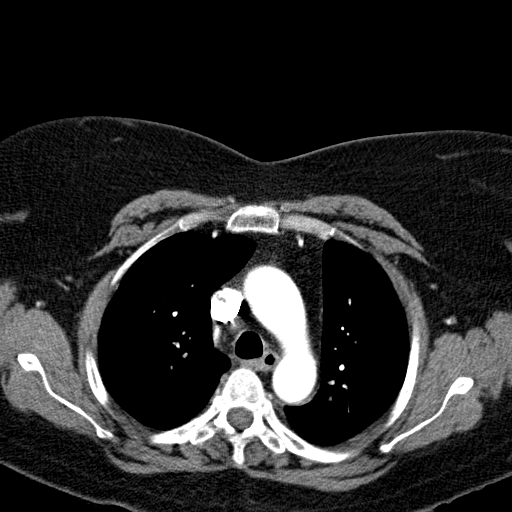
[im 76/91  lung]
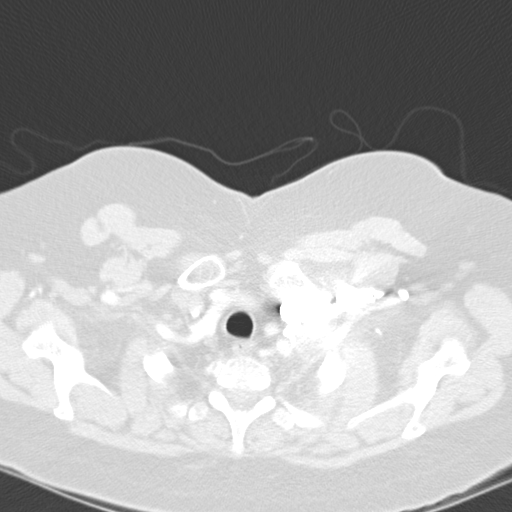

[5 of 36 positions shown; findings below may reference images not displayed]

FINDINGS: There is no demonstrable pulmonary embolus. The ascending aorta
measures 3.6 x 3.6 cm but is not felt to be frankly aneurysmal.
There is no evidence of aneurysm or dissection in the thoracic
region.

There is patchy atelectatic change in the right lower lobe. There is
a small area of infiltrate in the posterior segment of the left
upper lobe at the level of the aortic arch. Elsewhere lungs are
clear.

There is no appreciable thoracic adenopathy. Pericardium is not
thickened. There is moderate mediastinal fat. There are scattered
foci of calcification in the left anterior descending coronary
artery.

In the visualized upper abdomen, there is fatty change in the liver.
Visualized upper abdomen otherwise appears unremarkable. There are
no blastic or lytic bone lesions. There is mild anterior wedging of
the T4 vertebral body, a finding not present previously. There is
also slight anterior wedging of the T12 vertebral body, not present
previously. Thyroid appears normal.

Review of the MIP images confirms the above findings.
IMPRESSION: No demonstrable pulmonary embolus. Small area of infiltrate
posterior segment left upper lobe. Atelectasis right base. Mild
prominence of the ascending aorta without frank aneurysm. Anterior
wedging of the T4 and to a lesser extent T12 vertebral bodies, mild
but not present previously.

## 2015-06-12 IMAGING — CR DG THORACIC SPINE 3V
3 series · 3 of 3 positions shown · non-contrast
Comparison: Chest radiographs 12/26/2013. Chest CTA 04/05/2013. CT
Abdomen and Pelvis 01/21/2013.

CLINICAL DATA: [age] -year-old female with back pain. Initial
encounter.

EXAM:
THORACIC SPINE - 2 VIEW + SWIMMERS

[view not recorded (1 of 3)]
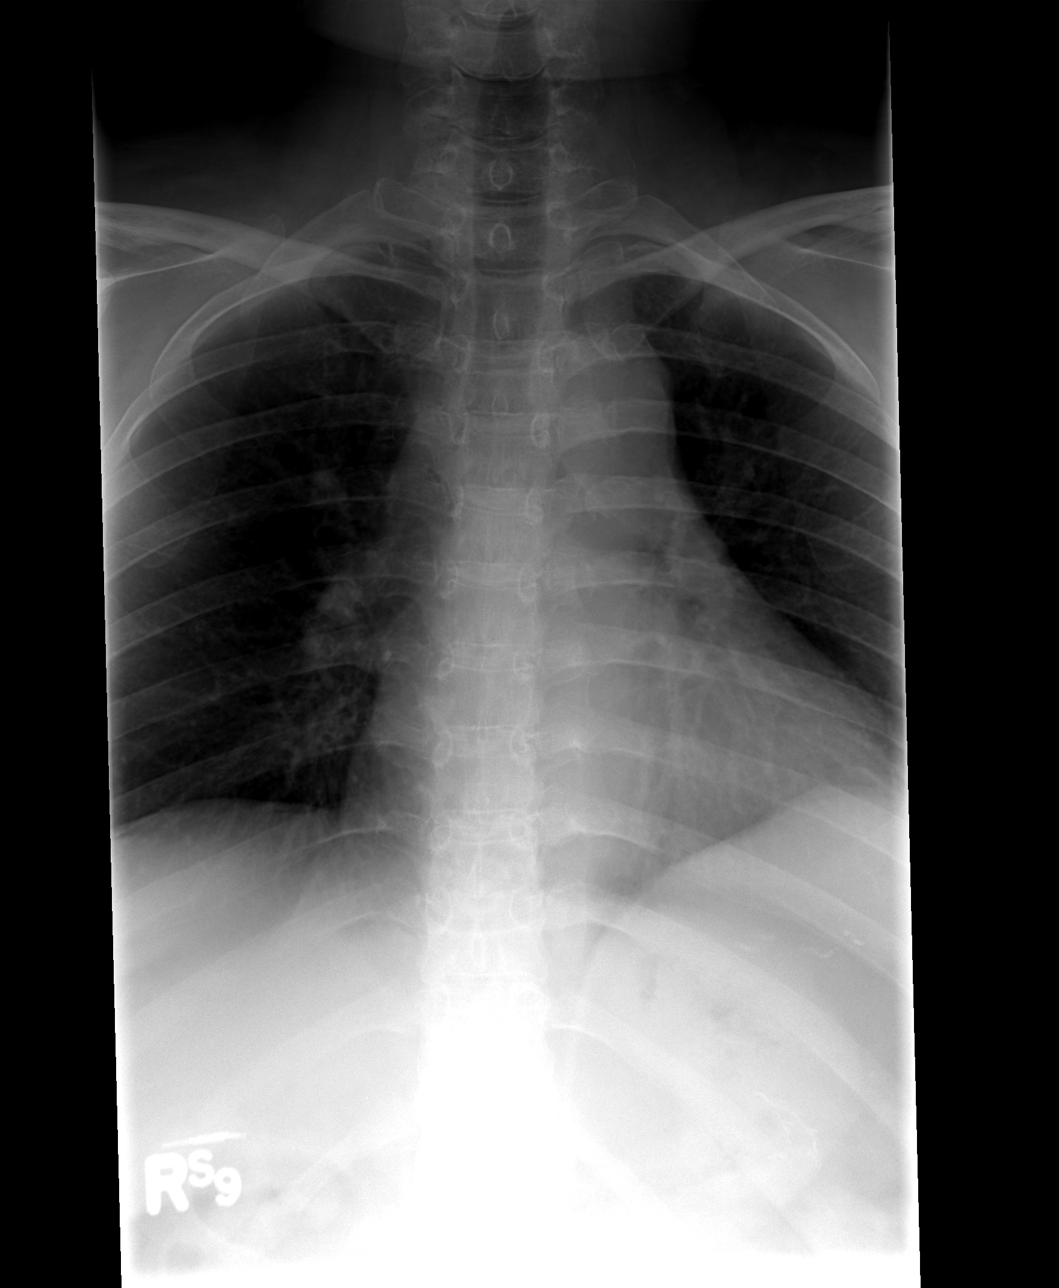

[view not recorded (2 of 3)]
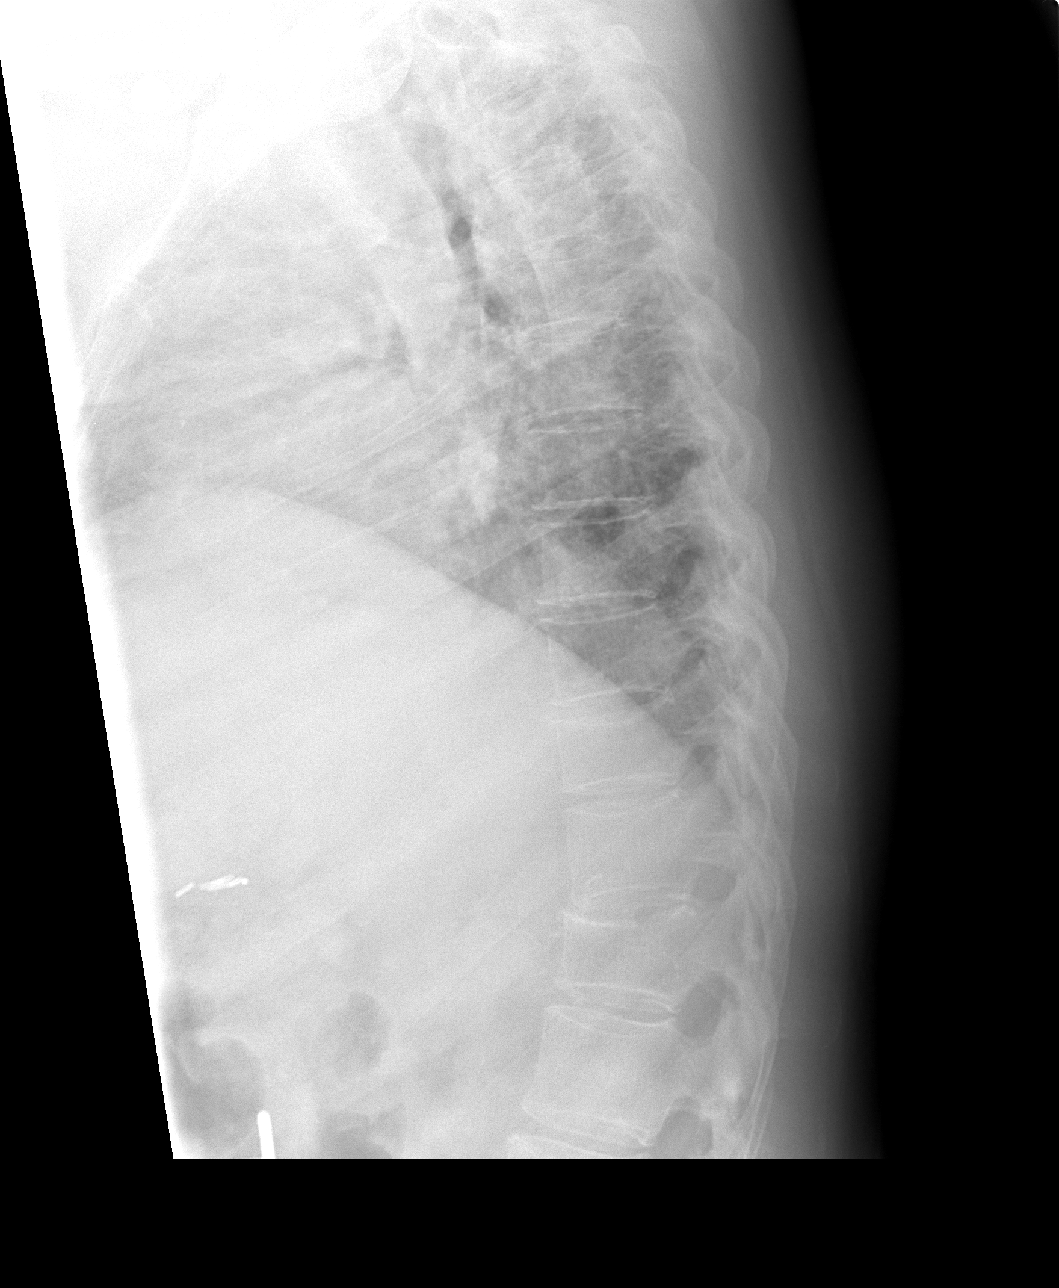

[view not recorded (3 of 3)]
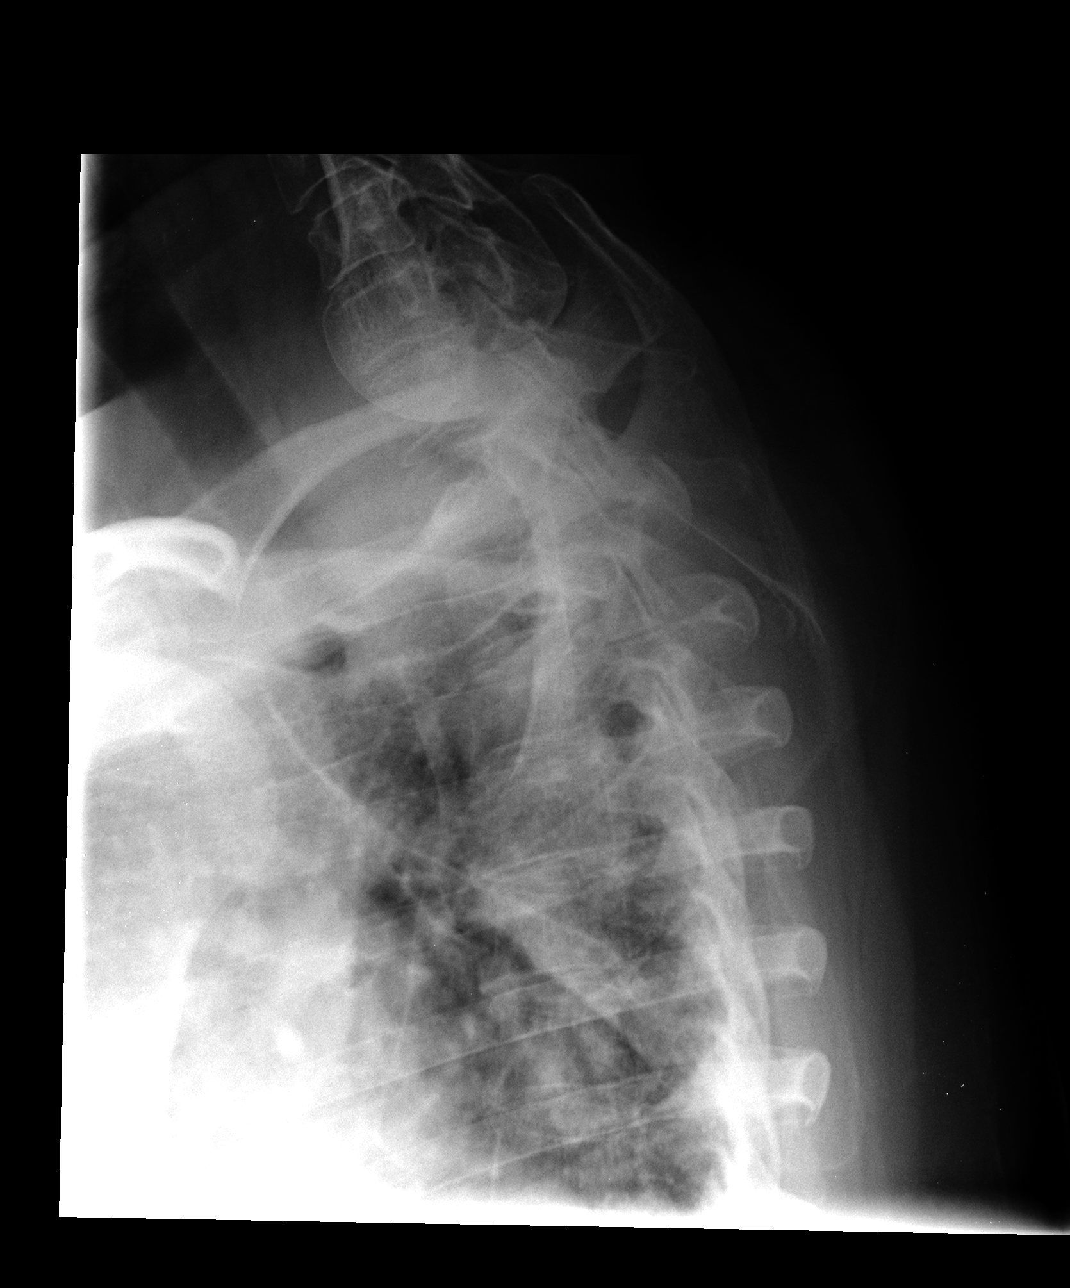

[3 of 3 positions shown; findings below may reference images not displayed]

FINDINGS: Normal lumbar segmentation. Chronic T12 compression fracture is
stable since 9499. However, mild compression of T10 is new since
that time. The T10 finding might also be new since the recent chest
radiographs, uncertain. Elsewhere normal thoracic vertebral height
and alignment. Posterior ribs intact. Grossly stable thoracic
visceral contours.
IMPRESSION: 1. Mild T10 compression fracture, favor acute or subacute.
If specific therapy such as vertebroplasty is desired, lumbar MRI or
whole-body bone scan would best determine acuity.
2. Chronic T12 fracture is stable since [DATE].

## 2015-06-13 ENCOUNTER — Ambulatory Visit: Payer: Medicare Other | Admitting: Gastroenterology

## 2015-07-12 ENCOUNTER — Emergency Department (HOSPITAL_COMMUNITY)
Admission: EM | Admit: 2015-07-12 | Discharge: 2015-07-12 | Disposition: A | Discharge status: Home / Self Care | Payer: Medicare Other | Attending: Emergency Medicine | Admitting: Emergency Medicine

## 2015-07-12 ENCOUNTER — Encounter (HOSPITAL_COMMUNITY): Payer: Self-pay | Admitting: Emergency Medicine

## 2015-07-12 DIAGNOSIS — E099 Drug or chemical induced diabetes mellitus without complications: Secondary | ICD-10-CM | POA: Insufficient documentation

## 2015-07-12 DIAGNOSIS — Z7952 Long term (current) use of systemic steroids: Secondary | ICD-10-CM | POA: Insufficient documentation

## 2015-07-12 DIAGNOSIS — K625 Hemorrhage of anus and rectum: Secondary | ICD-10-CM | POA: Diagnosis not present

## 2015-07-12 DIAGNOSIS — Z86711 Personal history of pulmonary embolism: Secondary | ICD-10-CM | POA: Insufficient documentation

## 2015-07-12 DIAGNOSIS — Z79899 Other long term (current) drug therapy: Secondary | ICD-10-CM | POA: Insufficient documentation

## 2015-07-12 DIAGNOSIS — M791 Myalgia: Secondary | ICD-10-CM | POA: Diagnosis not present

## 2015-07-12 DIAGNOSIS — M545 Low back pain: Secondary | ICD-10-CM | POA: Insufficient documentation

## 2015-07-12 DIAGNOSIS — G8929 Other chronic pain: Secondary | ICD-10-CM | POA: Insufficient documentation

## 2015-07-12 DIAGNOSIS — R51 Headache: Secondary | ICD-10-CM | POA: Diagnosis not present

## 2015-07-12 DIAGNOSIS — T380X5A Adverse effect of glucocorticoids and synthetic analogues, initial encounter: Secondary | ICD-10-CM | POA: Insufficient documentation

## 2015-07-12 DIAGNOSIS — Z792 Long term (current) use of antibiotics: Secondary | ICD-10-CM | POA: Insufficient documentation

## 2015-07-12 DIAGNOSIS — M7918 Myalgia, other site: Secondary | ICD-10-CM

## 2015-07-12 DIAGNOSIS — R195 Other fecal abnormalities: Secondary | ICD-10-CM | POA: Insufficient documentation

## 2015-07-12 DIAGNOSIS — R519 Headache, unspecified: Secondary | ICD-10-CM

## 2015-07-12 DIAGNOSIS — Z87891 Personal history of nicotine dependence: Secondary | ICD-10-CM | POA: Insufficient documentation

## 2015-07-12 DIAGNOSIS — Z794 Long term (current) use of insulin: Secondary | ICD-10-CM | POA: Diagnosis not present

## 2015-07-12 DIAGNOSIS — I10 Essential (primary) hypertension: Secondary | ICD-10-CM | POA: Insufficient documentation

## 2015-07-12 DIAGNOSIS — Z8619 Personal history of other infectious and parasitic diseases: Secondary | ICD-10-CM | POA: Insufficient documentation

## 2015-07-12 DIAGNOSIS — J45909 Unspecified asthma, uncomplicated: Secondary | ICD-10-CM | POA: Insufficient documentation

## 2015-07-12 DIAGNOSIS — Z87448 Personal history of other diseases of urinary system: Secondary | ICD-10-CM | POA: Diagnosis not present

## 2015-07-12 DIAGNOSIS — F329 Major depressive disorder, single episode, unspecified: Secondary | ICD-10-CM | POA: Diagnosis not present

## 2015-07-12 DIAGNOSIS — Z862 Personal history of diseases of the blood and blood-forming organs and certain disorders involving the immune mechanism: Secondary | ICD-10-CM | POA: Diagnosis not present

## 2015-07-12 LAB — COMPREHENSIVE METABOLIC PANEL
ALBUMIN: 3.6 g/dL (ref 3.5–5.0)
ALK PHOS: 58 U/L (ref 38–126)
ALT: 40 U/L (ref 14–54)
AST: 33 U/L (ref 15–41)
Anion gap: 7 (ref 5–15)
BUN: 18 mg/dL (ref 6–20)
CALCIUM: 9.2 mg/dL (ref 8.9–10.3)
CHLORIDE: 112 mmol/L — AB (ref 101–111)
CO2: 22 mmol/L (ref 22–32)
CREATININE: 1.31 mg/dL — AB (ref 0.44–1.00)
GFR calc non Af Amer: 47 mL/min — ABNORMAL LOW (ref >60)
GFR, EST AFRICAN AMERICAN: 54 mL/min — AB (ref >60)
GLUCOSE: 100 mg/dL — AB (ref 65–99)
Potassium: 4.1 mmol/L (ref 3.5–5.1)
SODIUM: 141 mmol/L (ref 135–145)
Total Bilirubin: 0.6 mg/dL (ref 0.3–1.2)
Total Protein: 6.5 g/dL (ref 6.5–8.1)

## 2015-07-12 LAB — TYPE AND SCREEN
ABO/RH(D): A POS
Antibody Screen: NEGATIVE

## 2015-07-12 LAB — CBC
HCT: 45.5 % (ref 36.0–46.0)
Hemoglobin: 14.7 g/dL (ref 12.0–15.0)
MCH: 28.4 pg (ref 26.0–34.0)
MCHC: 32.3 g/dL (ref 30.0–36.0)
MCV: 87.8 fL (ref 78.0–100.0)
PLATELETS: 153 10*3/uL (ref 150–400)
RBC: 5.18 MIL/uL — ABNORMAL HIGH (ref 3.87–5.11)
RDW: 16.5 % — AB (ref 11.5–15.5)
WBC: 9.2 10*3/uL (ref 4.0–10.5)

## 2015-07-12 LAB — POC OCCULT BLOOD, ED: Fecal Occult Bld: POSITIVE — AB

## 2015-07-12 MED ORDER — HYDROCODONE-ACETAMINOPHEN 5-325 MG PO TABS
2.0000 | ORAL_TABLET | Freq: Once | ORAL | Status: AC
  Administered 2015-07-12: 2 via ORAL
  Filled 2015-07-12: qty 2

## 2015-07-12 MED ORDER — ONDANSETRON 8 MG PO TBDP
8.0000 mg | ORAL_TABLET | Freq: Once | ORAL | Status: AC
  Administered 2015-07-12: 8 mg via ORAL
  Filled 2015-07-12: qty 1

## 2015-07-12 NOTE — ED Provider Notes (Signed)
CSN: [id number]     Arrival date & time 07/12/15  1215 History   First MD Initiated Contact with Patient 07/12/15 1505     Chief Complaint  Patient presents with  . Rectal Bleeding     (Consider location/radiation/quality/duration/timing/severity/associated sxs/prior Treatment) Patient is a 49 y.o. female presenting with hematochezia. The history is provided by the patient.  Rectal Bleeding Associated symptoms: no abdominal pain, no epistaxis, no fever and no vomiting   Patient indicates noted episode bright red blood from rectum while having bm today. States prior to that bms were normal in color. No melena. Pt notes remote hx hemorrhoids. No rectal pain or straining. No abd distension or vomiting. No abd pain. No other abnormal bruising or bleeding. Denies fever or chills.  Pt also notes hx chronic low back and intermittent headaches, and c/o gradual onset dull frontal headache, and states needs med for her low back pain. Denies recent injury or strain. No radicular pain. No numbness/weakness, or loss of normal functional ability. w headache, same as prior headaches, gradual onset, mild to moderate. No sinus drainage or pain. No uri c/o. No eye pain or change in vision. No neck stiffness or pain.       Past Medical History  Diagnosis Date  . Essential hypertension, benign   . 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  Last was Prednisone $RemoveBefore'40mg'[id number]$ ; 2 wk in 10/2012.  She also was on Prednisone bridged with Cellcept briefly but stopped due to lack of insurance.   . Depression   . Thrush, oral 05/29/2011  . Steroid-induced diabetes ([place])   . Pulmonary embolism ([place]) 04/2012  . Asthma   . Renal insufficiency   . Chronic chest pain   . Normal cardiac stress test 02/2014  . Gout    Past Surgical History  Procedure Laterality Date  . Cholecystectomy  2008  . Bone marrow biopsy    .  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 Colonoscopy with esophagogastroduodenoscopy (egd) N/A 01/04/2013    Dr. Gala Romney: esophageal plaques with +KOH, hh. Gastric antrum abnormal, bx reactive gastropathy. Anal canal hemorrhoids, colonic tics, normal TI, single polyp (sessile serrated tubular adenoma). Next TCS 12/2017  . Cataract extraction     Family History  Problem Relation Age of Onset  . Cervical cancer Mother   . Lung cancer Father   . Pneumonia Brother   . Cancer Paternal Grandmother 26    colon cancer, breast cancer  . Cancer Paternal Grandfather 57    colon cancer  . AAA (abdominal aortic aneurysm) Paternal Grandmother    Social History  Substance Use Topics  . Smoking status: Former Smoker -- 0 years    Types: Cigarettes    Quit date: 02/14/2009  . Smokeless tobacco: Never Used  . Alcohol Use: No   OB History    No data available     Review of Systems  Constitutional: Negative for fever and chills.  HENT: Negative for nosebleeds.   Eyes: Negative for redness.  Respiratory: Negative for cough and shortness of breath.   Cardiovascular: Negative for chest pain and leg swelling.  Gastrointestinal: Positive for blood in stool and hematochezia. Negative for vomiting, abdominal pain, diarrhea and constipation.  Genitourinary: Negative for dysuria, hematuria and flank pain.  Musculoskeletal: Positive for back pain. Negative for neck pain.  Skin: Negative for rash.  Neurological: Positive for headaches. Negative for speech difficulty, weakness and numbness.  Hematological: Does not bruise/bleed easily.  Psychiatric/Behavioral: Negative for confusion.      Allergies  Brintellix; Yellow jacket venom; and Adhesive  Home Medications   Prior to Admission medications   Medication Sig Start Date End Date Taking? Authorizing Provider  ALPRAZolam Duanne Moron) 1 MG tablet Take 1 mg by mouth 3 (three) times daily as needed for anxiety.     Historical Provider, MD  calcitonin, salmon,  (MIACALCIN/FORTICAL) 200 UNIT/ACT nasal spray Place 1 spray into alternate nostrils daily.    Historical Provider, MD  dexamethasone (DECADRON) 0.5 MG tablet Take 0.5 mg by mouth 2 (two) times daily.     Historical Provider, MD  dicyclomine (BENTYL) 10 MG capsule Take 1 capsule (10 mg total) by mouth 4 (four) times daily -  before meals and at bedtime. 01/24/15   Carlis Stable, NP  diphenoxylate-atropine (LOMOTIL) 2.5-0.025 MG per tablet Take 1 tablet by mouth every 6 (six) hours as needed for diarrhea or loose stools.  01/15/15   Historical Provider, MD  furosemide (LASIX) 20 MG tablet Take 20-40 mg by mouth 2 (two) times daily as needed for fluid.  12/16/14   Historical Provider, MD  HYDROcodone-acetaminophen (NORCO) 10-325 MG per tablet Take 1 tablet by mouth every 4 (four) hours as needed for moderate pain.  01/28/15   Historical Provider, MD  insulin aspart (NOVOLOG) 100 UNIT/ML injection Inject 2-5 Units into the skin daily as needed for high blood sugar. Per sliding scale.    Historical Provider, MD  ipratropium-albuterol (DUONEB) 0.5-2.5 (3) MG/3ML SOLN Take 3 mLs by nebulization 3 (three) times daily as needed (shorntess of breath/wheezing). 02/18/15   Kathie Dike, MD  KLOR-CON 10 10 MEQ tablet Take 10 mEq by mouth as needed (Only takes with the furosemide).  11/13/14   Historical Provider, MD  levofloxacin (LEVAQUIN) 750 MG tablet Take 1 tablet (750 mg total) by mouth daily. 02/18/15   Kathie Dike, MD  metoprolol succinate (TOPROL-XL) 50 MG 24 hr tablet Take 1 tablet (50 mg total) by mouth daily. 01/25/15   Satira Sark, MD  montelukast (SINGULAIR) 10 MG tablet Take 10 mg by mouth daily.    Historical Provider, MD  ondansetron (ZOFRAN) 4 MG tablet Take 1 tablet by mouth every 6 (six) hours as needed for nausea or vomiting.  01/27/15   Historical Provider, MD  OVER THE COUNTER MEDICATION Place 1 drop into both eyes daily as needed (Dry Eyes).    Historical Provider, MD  PROAIR RESPICLICK 053 (90  BASE) MCG/ACT AEPB Inhale 2 puffs into the lungs every 4 (four) hours as needed (Shortness of breath).  12/27/14   Historical Provider, MD  sertraline (ZOLOFT) 50 MG tablet Take 150 mg by mouth daily.     Historical Provider, MD   BP 110/95 mmHg  Pulse 75  Temp(Src) 98.1 F (36.7 C)  Resp 18  Ht $R'5\' 5"'PX$  (1.651 m)  Wt 220 lb (99.791 kg)  BMI 36.61 kg/m2  SpO2 98%  LMP 05/13/2011 Physical Exam  Constitutional: She is oriented to person, place, and time. She appears well-developed and well-nourished. No distress.  HENT:  Head: Atraumatic.  Nose: Nose normal.  Mouth/Throat: Oropharynx is clear and moist.  No sinus or temporal tenderness.  Eyes: Conjunctivae and EOM are normal. Pupils are equal, round, and reactive to light. No scleral icterus.  Neck: Neck supple. No tracheal deviation present. No thyromegaly present.  No stiffness or rigidity.   Cardiovascular: Normal rate, regular rhythm, normal heart sounds and intact  distal pulses.  Exam reveals no gallop and no friction rub.   No murmur heard. Pulmonary/Chest: Effort normal and breath sounds normal. No respiratory distress.  Abdominal: Soft. Normal appearance and bowel sounds are normal. She exhibits no distension and no mass. There is no tenderness. There is no rebound and no guarding.  obese  Genitourinary:  No cva tenderness. Rectal, soft brown stool, ?tiny streak brb. No mass felt.   Musculoskeletal: Normal range of motion. She exhibits no edema or tenderness.  LTS spine non tender, aligned. Mild lumbar muscular tenderness.   Neurological: She is alert and oriented to person, place, and time. No cranial nerve deficit.  Motor intact bilaterally. stre 5/5. sens grossly intact.  Steady gait.   Skin: Skin is warm and dry. No rash noted. She is not diaphoretic.  Psychiatric: She has a normal mood and affect.  Nursing note and vitals reviewed.   ED Course  Procedures (including critical care time) Labs Review  Results for orders  placed or performed during the hospital encounter of 07/12/15  Comprehensive metabolic panel  Result Value Ref Range   Sodium 141 135 - 145 mmol/L   Potassium 4.1 3.5 - 5.1 mmol/L   Chloride 112 (H) 101 - 111 mmol/L   CO2 22 22 - 32 mmol/L   Glucose, Bld 100 (H) 65 - 99 mg/dL   BUN 18 6 - 20 mg/dL   Creatinine, Ser 1.31 (H) 0.44 - 1.00 mg/dL   Calcium 9.2 8.9 - 10.3 mg/dL   Total Protein 6.5 6.5 - 8.1 g/dL   Albumin 3.6 3.5 - 5.0 g/dL   AST 33 15 - 41 U/L   ALT 40 14 - 54 U/L   Alkaline Phosphatase 58 38 - 126 U/L   Total Bilirubin 0.6 0.3 - 1.2 mg/dL   GFR calc non Af Amer 47 (L) >60 mL/min   GFR calc Af Amer 54 (L) >60 mL/min   Anion gap 7 5 - 15  CBC  Result Value Ref Range   WBC 9.2 4.0 - 10.5 K/uL   RBC 5.18 (H) 3.87 - 5.11 MIL/uL   Hemoglobin 14.7 12.0 - 15.0 g/dL   HCT 45.5 36.0 - 46.0 %   MCV 87.8 78.0 - 100.0 fL   MCH 28.4 26.0 - 34.0 pg   MCHC 32.3 30.0 - 36.0 g/dL   RDW 16.5 (H) 11.5 - 15.5 %   Platelets 153 150 - 400 K/uL  Type and screen [facility]  Result Value Ref Range   ABO/RH(D) A POS    Antibody Screen NEG    Sample Expiration 07/15/2015        I have personally reviewed and evaluated these lab results as part of my medical decision-making.    MDM   Pt requests pain med, and nausea med.  Hydrocodone po.  zofran po.   hgb normal. No recurrent bleeding in ED, no melena.   Heme pos stools, reports neg colonoscopy in past ([place]) - will refer to f/u there.  abd soft nt.  Pt comfortable appearing.  Pt currently appears stable for d/c.        Lajean Saver, MD 07/12/15 [phone]

## 2015-07-12 NOTE — ED Notes (Signed)
POC occult blood positive

## 2015-07-12 NOTE — ED Notes (Signed)
Pt states that she had an episode of bright red blood in her stool today.  Had one other one earlier this week.  C/o back pain and headache.

## 2015-07-12 NOTE — Discharge Instructions (Signed)
It was our pleasure to provide your ER care today - we hope that you feel better.  Rest. Drink plenty of fluids.  Take your medication/pain medication as need.  Avoid bending at waist, or heavy lifting > 20 lbs for the next week.  Follow up with primary care doctor in the coming week.  For rectal bleeding, follow up with GI doctor in the next 3-4 days - call office Monday morning to arrange appointment with them.  Return to ER if worse, recurrent/heavy rectal bleeding, weak/faint, severe abdominal pain, fevers, other concern.  You were given pain medication in the ER - no driving for the next 4 hours.      Gastrointestinal Bleeding Gastrointestinal (GI) bleeding means there is bleeding somewhere along the digestive tract, between the mouth and anus. CAUSES  There are many different problems that can cause GI bleeding. Possible causes include:  Esophagitis. This is inflammation, irritation, or swelling of the esophagus.  Hemorrhoids.These are veins that are full of blood (engorged) in the rectum. They cause pain, inflammation, and may bleed.  Anal fissures.These are areas of painful tearing which may bleed. They are often caused by passing hard stool.  Diverticulosis.These are pouches that form on the colon over time, with age, and may bleed significantly.  Diverticulitis.This is inflammation in areas with diverticulosis. It can cause pain, fever, and bloody stools, although bleeding is rare.  Polyps and cancer. Colon cancer often starts out as precancerous polyps.  Gastritis and ulcers.Bleeding from the upper gastrointestinal tract (near the stomach) may travel through the intestines and produce black, sometimes tarry, often bad smelling stools. In certain cases, if the bleeding is fast enough, the stools may not be black, but red. This condition may be life-threatening. SYMPTOMS   Vomiting bright red blood or material that looks like coffee grounds.  Bloody, black, or  tarry stools. DIAGNOSIS  Your caregiver may diagnose your condition by taking your history and performing a physical exam. More tests may be needed, including:  X-rays and other imaging tests.  Esophagogastroduodenoscopy (EGD). This test uses a flexible, lighted tube to look at your esophagus, stomach, and small intestine.  Colonoscopy. This test uses a flexible, lighted tube to look at your colon. TREATMENT  Treatment depends on the cause of your bleeding.   For bleeding from the esophagus, stomach, small intestine, or colon, the caregiver doing your EGD or colonoscopy may be able to stop the bleeding as part of the procedure.  Inflammation or infection of the colon can be treated with medicines.  Many rectal problems can be treated with creams, suppositories, or warm baths.  Surgery is sometimes needed.  Blood transfusions are sometimes needed if you have lost a lot of blood. If bleeding is slow, you may be allowed to go home. If there is a lot of bleeding, you will need to stay in the hospital for observation. HOME CARE INSTRUCTIONS   Take any medicines exactly as prescribed.  Keep your stools soft by eating foods that are high in fiber. These foods include whole grains, legumes, fruits, and vegetables. Prunes (1 to 3 a day) work well for many people.  Drink enough fluids to keep your urine clear or pale yellow. SEEK IMMEDIATE MEDICAL CARE IF:   Your bleeding increases.  You feel lightheaded, weak, or you faint.  You have severe cramps in your back or abdomen.  You pass large blood clots in your stool.  Your problems are getting worse. MAKE SURE YOU:   Understand  these instructions.  Will watch your condition.  Will get help right away if you are not doing well or get worse.   This information is not intended to replace advice given to you by your health care provider. Make sure you discuss any questions you have with your health care provider.   Document  Released: 09/11/2000 Document Revised: 08/31/2012 Document Reviewed: 03/04/2015 Elsevier Interactive Patient Education 2016 [place] Headache Without Cause A headache is pain or discomfort felt around the head or neck area. The specific cause of a headache may not be found. There are many causes and types of headaches. A few common ones are:  Tension headaches.  Migraine headaches.  Cluster headaches.  Chronic daily headaches. HOME CARE INSTRUCTIONS  Watch your condition for any changes. Take these steps to help with your condition: Managing Pain  Take over-the-counter and prescription medicines only as told by your health care provider.  Lie down in a dark, quiet room when you have a headache.  If directed, apply ice to the head and neck area:  Put ice in a plastic bag.  Place a towel between your skin and the bag.  Leave the ice on for 20 minutes, 2-3 times per day.  Use a heating pad or hot shower to apply heat to the head and neck area as told by your health care provider.  Keep lights dim if bright lights bother you or make your headaches worse. Eating and Drinking  Eat meals on a regular schedule.  Limit alcohol use.  Decrease the amount of caffeine you drink, or stop drinking caffeine. General Instructions  Keep all follow-up visits as told by your health care provider. This is important.  Keep a headache journal to help find out what may trigger your headaches. For example, write down:  What you eat and drink.  How much sleep you get.  Any change to your diet or medicines.  Try massage or other relaxation techniques.  Limit stress.  Sit up straight, and do not tense your muscles.  Do not use tobacco products, including cigarettes, chewing tobacco, or e-cigarettes. If you need help quitting, ask your health care provider.  Exercise regularly as told by your health care provider.  Sleep on a regular schedule. Get 7-9 hours of  sleep, or the amount recommended by your health care provider. SEEK MEDICAL CARE IF:   Your symptoms are not helped by medicine.  You have a headache that is different from the usual headache.  You have nausea or you vomit.  You have a fever. SEEK IMMEDIATE MEDICAL CARE IF:   Your headache becomes severe.  You have repeated vomiting.  You have a stiff neck.  You have a loss of vision.  You have problems with speech.  You have pain in the eye or ear.  You have muscular weakness or loss of muscle control.  You lose your balance or have trouble walking.  You feel faint or pass out.  You have confusion.   This information is not intended to replace advice given to you by your health care provider. Make sure you discuss any questions you have with your health care provider.   Document Released: 09/14/2005 Document Revised: 06/05/2015 Document Reviewed: 01/07/2015 Elsevier Interactive Patient Education 2016 [employer]    Lumbosacral Strain Lumbosacral strain is a strain of any of the parts that make up your lumbosacral vertebrae. Your lumbosacral vertebrae are the bones that make up the lower third  of your backbone. Your lumbosacral vertebrae are held together by muscles and tough, fibrous tissue (ligaments).  CAUSES  A sudden blow to your back can cause lumbosacral strain. Also, anything that causes an excessive stretch of the muscles in the low back can cause this strain. This is typically seen when people exert themselves strenuously, fall, lift heavy objects, bend, or crouch repeatedly. RISK FACTORS  Physically demanding work.  Participation in pushing or pulling sports or sports that require a sudden twist of the back (tennis, golf, baseball).  Weight lifting.  Excessive lower back curvature.  Forward-tilted pelvis.  Weak back or abdominal muscles or both.  Tight hamstrings. SIGNS AND SYMPTOMS  Lumbosacral strain may cause pain in the area of your injury  or pain that moves (radiates) down your leg.  DIAGNOSIS Your health care provider can often diagnose lumbosacral strain through a physical exam. In some cases, you may need tests such as X-ray exams.  TREATMENT  Treatment for your lower back injury depends on many factors that your clinician will have to evaluate. However, most treatment will include the use of anti-inflammatory medicines. HOME CARE INSTRUCTIONS   Avoid hard physical activities (tennis, racquetball, waterskiing) if you are not in proper physical condition for it. This may aggravate or create problems.  If you have a back problem, avoid sports requiring sudden body movements. Swimming and walking are generally safer activities.  Maintain good posture.  Maintain a healthy weight.  For acute conditions, you may put ice on the injured area.  Put ice in a plastic bag.  Place a towel between your skin and the bag.  Leave the ice on for 20 minutes, 2-3 times a day.  When the low back starts healing, stretching and strengthening exercises may be recommended. SEEK MEDICAL CARE IF:  Your back pain is getting worse.  You experience severe back pain not relieved with medicines. SEEK IMMEDIATE MEDICAL CARE IF:   You have numbness, tingling, weakness, or problems with the use of your arms or legs.  There is a change in bowel or bladder control.  You have increasing pain in any area of the body, including your belly (abdomen).  You notice shortness of breath, dizziness, or feel faint.  You feel sick to your stomach (nauseous), are throwing up (vomiting), or become sweaty.  You notice discoloration of your toes or legs, or your feet get very cold. MAKE SURE YOU:   Understand these instructions.  Will watch your condition.  Will get help right away if you are not doing well or get worse.   This information is not intended to replace advice given to you by your health care provider. Make sure you discuss any questions  you have with your health care provider.   Document Released: 06/24/2005 Document Revised: 10/05/2014 Document Reviewed: 05/03/2013 Elsevier Interactive Patient Education [employer].

## 2015-07-14 IMAGING — CR DG CHEST 1V PORT
1 series · 1 of 1 positions shown · non-contrast
Comparison: 12/26/2013

CLINICAL DATA: Chest pain.  Hypertension, asthma.

EXAM:
PORTABLE CHEST - 1 VIEW

[portable]
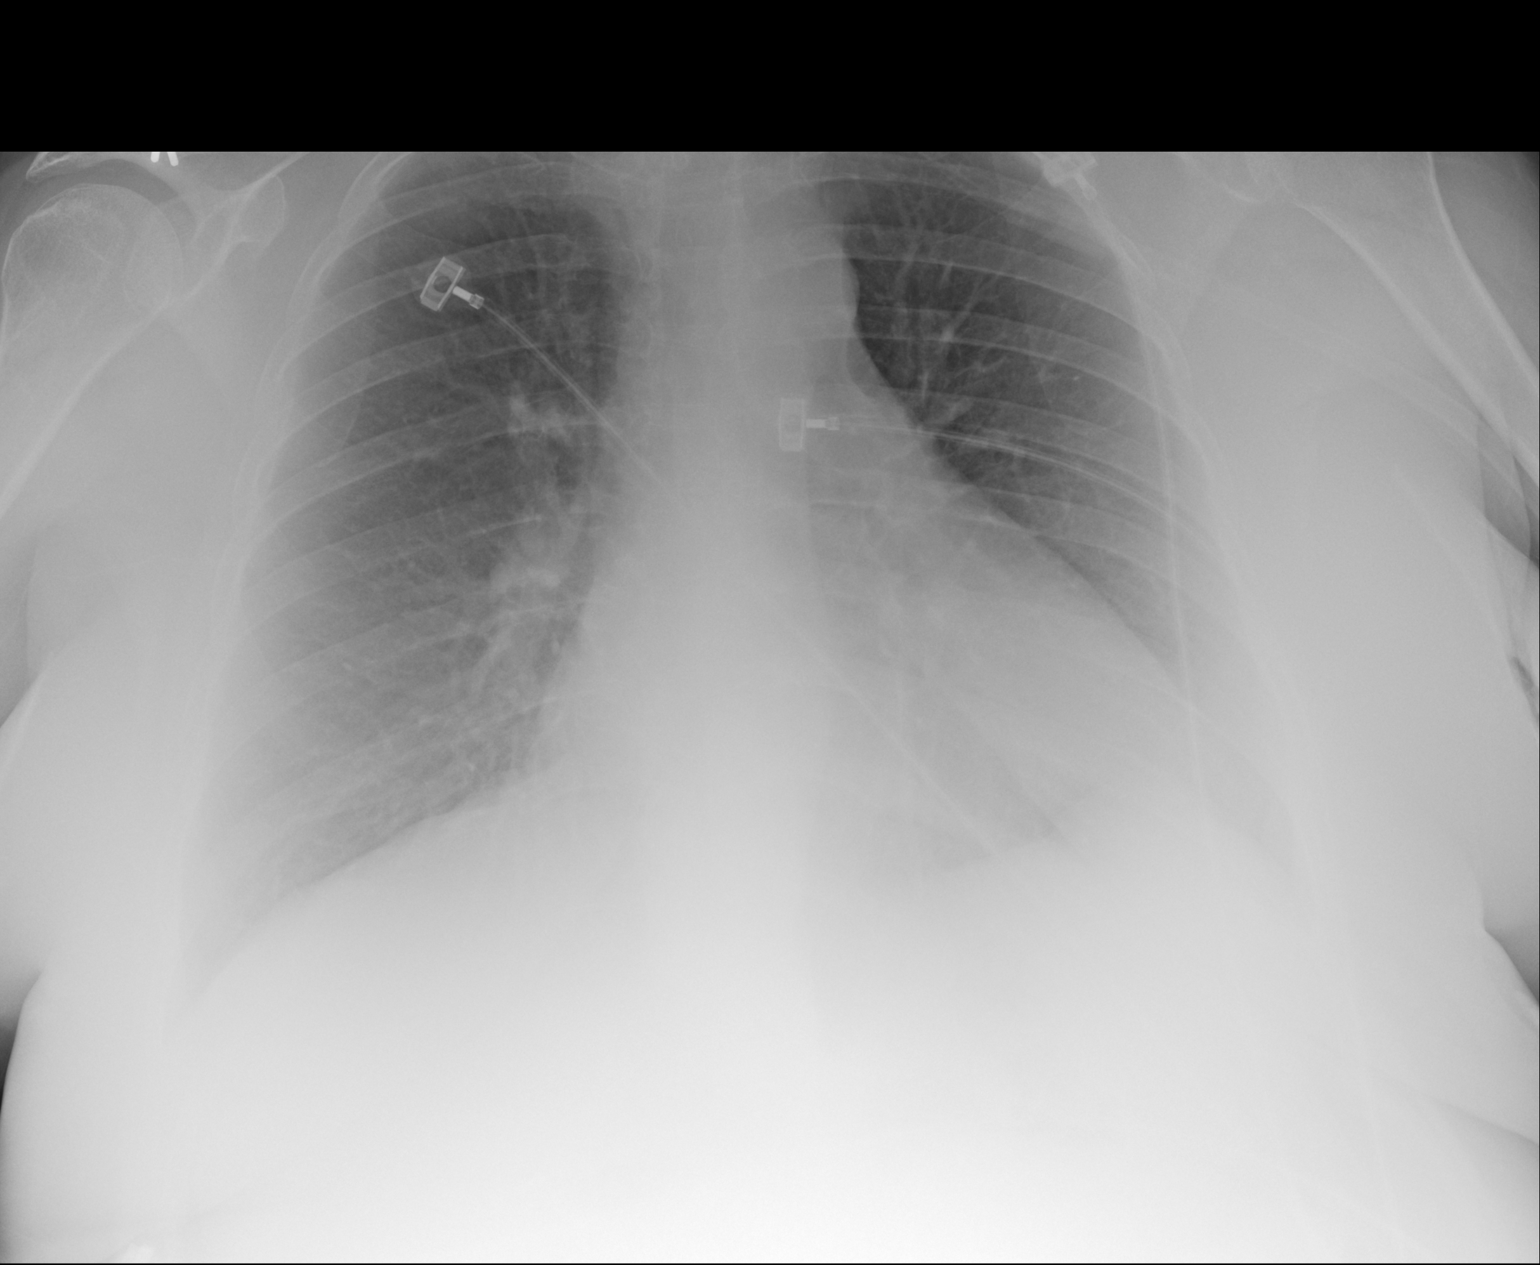

[1 of 1 positions shown; findings below may reference images not displayed]

FINDINGS: Mild cardiomegaly. Lungs are clear. No effusions. No acute bony
abnormality.
IMPRESSION: Mild cardiomegaly.  No active disease.

## 2015-07-16 ENCOUNTER — Other Ambulatory Visit (HOSPITAL_COMMUNITY): Payer: Self-pay | Admitting: Hematology & Oncology

## 2015-07-16 ENCOUNTER — Encounter (HOSPITAL_COMMUNITY): Payer: Self-pay | Admitting: Hematology & Oncology

## 2015-07-16 ENCOUNTER — Encounter (HOSPITAL_BASED_OUTPATIENT_CLINIC_OR_DEPARTMENT_OTHER): Payer: Medicare Other

## 2015-07-16 ENCOUNTER — Encounter (HOSPITAL_COMMUNITY): Payer: Medicare Other | Attending: Hematology & Oncology | Admitting: Hematology & Oncology

## 2015-07-16 VITALS — BP 148/107 | HR 74 | Temp 97.9°F | Resp 18 | Wt 236.0 lb

## 2015-07-16 DIAGNOSIS — Z0000 Encounter for general adult medical examination without abnormal findings: Secondary | ICD-10-CM | POA: Diagnosis not present

## 2015-07-16 DIAGNOSIS — Z1231 Encounter for screening mammogram for malignant neoplasm of breast: Secondary | ICD-10-CM

## 2015-07-16 DIAGNOSIS — F419 Anxiety disorder, unspecified: Secondary | ICD-10-CM | POA: Diagnosis not present

## 2015-07-16 DIAGNOSIS — D693 Immune thrombocytopenic purpura: Secondary | ICD-10-CM | POA: Insufficient documentation

## 2015-07-16 LAB — CBC WITH DIFFERENTIAL/PLATELET
BASOS ABS: 0.1 10*3/uL (ref 0.0–0.1)
BASOS PCT: 1 %
Eosinophils Absolute: 0.2 10*3/uL (ref 0.0–0.7)
Eosinophils Relative: 2 %
HEMATOCRIT: 45 % (ref 36.0–46.0)
HEMOGLOBIN: 14.2 g/dL (ref 12.0–15.0)
LYMPHS PCT: 38 %
Lymphs Abs: 3.6 10*3/uL (ref 0.7–4.0)
MCH: 28.1 pg (ref 26.0–34.0)
MCHC: 31.6 g/dL (ref 30.0–36.0)
MCV: 89.1 fL (ref 78.0–100.0)
MONO ABS: 1.2 10*3/uL — AB (ref 0.1–1.0)
Monocytes Relative: 12 %
NEUTROS ABS: 4.6 10*3/uL (ref 1.7–7.7)
NEUTROS PCT: 48 %
Platelets: 177 10*3/uL (ref 150–400)
RBC: 5.05 MIL/uL (ref 3.87–5.11)
RDW: 16.9 % — AB (ref 11.5–15.5)
WBC: 9.6 10*3/uL (ref 4.0–10.5)

## 2015-07-16 NOTE — Patient Instructions (Addendum)
[place] at [facility] Discharge Instructions  RECOMMENDATIONS MADE BY THE CONSULTANT AND ANY TEST RESULTS WILL BE SENT TO YOUR REFERRING PHYSICIAN.  Exam and discussion with Dr. Whitney Muse today. Referral to Dr. Laural Golden - his office with call you for an appointment. Return as scheduled in 6 months for lab work and office visit.  Call the clinic with any questions or concerns.    Thank you for choosing [place] at [facility] to provide your oncology and hematology care.  To afford each patient quality time with our provider, please arrive at least 15 minutes before your scheduled appointment time.    You need to re-schedule your appointment should you arrive 10 or more minutes late.  We strive to give you quality time with our providers, and arriving late affects you and other patients whose appointments are after yours.  Also, if you no show three or more times for appointments you may be dismissed from the clinic at the providers discretion.     Again, thank you for choosing [facility].  Our hope is that these requests will decrease the amount of time that you wait before being seen by our physicians.       _____________________________________________________________  Should you have questions after your visit to [facility], please contact our office at [phone] between the hours of 8:30 a.m. and 4:30 p.m.  Voicemails left after 4:30 p.m. will not be returned until the following business day.  For prescription refill requests, have your pharmacy contact our office.

## 2015-07-16 NOTE — Progress Notes (Signed)
LABS DRAWN

## 2015-07-16 NOTE — Progress Notes (Signed)
Glo Herring., MD [place] [id number]    DIAGNOSIS:  ITP with platelet count of 18K on 12/2012 diagnosed in 2011   Splenectomy   History of iron deficiency   DEXA with normal bone density 05/2014   CURRENT THERAPY: dexamethasone 0.5 mg bid  INTERVAL HISTORY: Summer Cook 49 y.o. female returns for follow-up of ITP.   Her last mammogram was on 12/05/14.  She had blood work completed today.    She visited the ER last Friday for rectal bleeding that had began earlier in the week and increased amount by last Friday.  She notes that there were clots both in the toilet and when she wiped.  She states that the ER dismissed these symptoms because of normal hemoglobin levels.  She was told that there were no external hemorrhoids.  She would like a scope or colonoscopy.  She attempted to decrease her Decadron intake 1/day.  She tried this for one week and notes that she slept most of the time.  She is back on 2/day.   She complains of weight gain.  She notes that she will begin to walk and exercise.  She has been through menopause.  She admits that she has been taking more than 3 Xanax/day due to high anxiety.  She notes that colon cancer runs in her family.  She continues to take dexamethasone 0.5 mg po bid in spite of normal platelet levels. She is not interested in decreasing her dose or tapering off.    MEDICAL HISTORY: Past Medical History  Diagnosis Date  . Essential hypertension, benign   . 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  Last was Prednisone 82m; 2 wk in 10/2012.  She also was on Prednisone bridged with Cellcept briefly but stopped due to lack of insurance.   . Depression   . Thrush, oral 05/29/2011  . Steroid-induced diabetes ([place]   . Pulmonary embolism ([place] 04/2012  . Asthma   . Renal insufficiency   . Chronic chest pain   .  Normal cardiac stress test 02/2014  . Gout     has Hypertension; ITP (idiopathic thrombocytopenic purpura); Tachycardia; Hypokalemia; Hemorrhoids; Drug-induced hyperglycemia; Post-splenectomy; Immunosuppressed status ([place]; Depression; Obesity; Headache(784.0); Renal insufficiency; PE (pulmonary embolism); Rectal bleeding; Abdominal pain, epigastric; Melena; Chronic anticoagulation; Unspecified constipation; Nausea with vomiting; Diarrhea; Abnormal LFTs; Hyponatremia; Orthostatic hypotension; Chest pain; Dehydration; Acute renal failure (HCC); CKD (chronic kidney disease) stage 3, GFR 30-59 ml/min; Hypotension; Sepsis ([place]; Bronchitis; Asthma, chronic; and Anxiety state on her problem list.     is allergic to brintellix; yellow jacket venom; and adhesive.  Ms. WRazhad no medications administered during this visit.  SURGICAL HISTORY: Past Surgical History  Procedure Laterality Date  . Cholecystectomy  2008  . Bone marrow biopsy    . 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 Colonoscopy with esophagogastroduodenoscopy (egd) N/A 01/04/2013    Dr. RGala Romney esophageal plaques with +KOH, hh. Gastric antrum abnormal, bx reactive gastropathy. Anal canal hemorrhoids, colonic tics, normal TI, single polyp (sessile serrated tubular adenoma). Next TCS 12/2017  . Cataract extraction      SOCIAL HISTORY: Social History   Social History  . Marital Status: Divorced    Spouse Name: N/A  . Number of Children: 1  . Years of Education: N/A   Occupational History  .      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  Social History Main Topics  . Smoking status: Former Smoker -- 0 years    Types: Cigarettes    Quit date: 02/14/2009  . Smokeless tobacco: Never Used  . Alcohol Use: No  . Drug Use: No  . Sexual Activity: No   Other Topics Concern  . Not on file   Social History Narrative    FAMILY HISTORY: Family History  Problem Relation Age of Onset  . Cervical cancer Mother   . Lung cancer Father   . Pneumonia  Brother   . Cancer Paternal Grandmother 25    colon cancer, breast cancer  . Cancer Paternal Grandfather 11    colon cancer  . AAA (abdominal aortic aneurysm) Paternal Grandmother     Review of Systems  Constitutional: Negative.   HENT: Negative.   Eyes: Negative.   Respiratory: Negative.   Cardiovascular: Negative.   Gastrointestinal: Positive for melena.  Musculoskeletal: Positive for joint pain.  Skin: Negative.   Neurological: Negative.   Endo/Heme/Allergies: Negative.   Psychiatric/Behavioral: Negative.   14 point review of systems was performed and is negative except as detailed under history of present illness and above   PHYSICAL EXAMINATION  ECOG PERFORMANCE STATUS: 0 - Asymptomatic  Filed Vitals:   07/16/15 1040  BP: 148/107  Pulse: 74  Temp: 97.9 F (36.6 C)  Resp: 18    Physical Exam  Constitutional: She is oriented to person, place, and time and well-developed, well-nourished, and in no distress.  HENT:  Head: Normocephalic and atraumatic.  Nose: Nose normal.  Mouth/Throat: Oropharynx is clear and moist. No oropharyngeal exudate.  Eyes: Conjunctivae and EOM are normal. Pupils are equal, round, and reactive to light. Right eye exhibits no discharge. Left eye exhibits no discharge. No scleral icterus.  Neck: Normal range of motion. Neck supple. No tracheal deviation present. No thyromegaly present.  Cardiovascular: Normal rate, regular rhythm and normal heart sounds.  Exam reveals no gallop and no friction rub.   No murmur heard. Pulmonary/Chest: Effort normal and breath sounds normal. She has no wheezes. She has no rales.  Abdominal: Soft. Bowel sounds are normal. She exhibits no distension and no mass. There is no tenderness. There is no rebound and no guarding.  Musculoskeletal: Normal range of motion. She exhibits no edema.  Lymphadenopathy:    She has no cervical adenopathy.  Neurological: She is alert and oriented to person, place, and time. She has  normal reflexes. No cranial nerve deficit. Gait normal. Coordination normal.  Skin: Skin is warm and dry. No rash noted.  Psychiatric: Mood, memory, affect and judgment normal.  Nursing note and vitals reviewed.   LABORATORY DATA: I have reviewed the data below as listed. CBC    Component Value Date/Time   WBC 9.6 07/16/2015 1036   WBC 7.0 02/02/2013 0840   RBC 5.05 07/16/2015 1036   RBC 4.68 02/02/2013 0840   HGB 14.2 07/16/2015 1036   HGB 11.1* 02/02/2013 0840   HCT 45.0 07/16/2015 1036   HCT 34.8 02/02/2013 0840   PLT 177 07/16/2015 1036   PLT 336 02/02/2013 0840   MCV 89.1 07/16/2015 1036   MCV 74.3* 02/02/2013 0840   MCH 28.1 07/16/2015 1036   MCH 23.7* 02/02/2013 0840   MCHC 31.6 07/16/2015 1036   MCHC 31.9 02/02/2013 0840   RDW 16.9* 07/16/2015 1036   RDW 21.1* 02/02/2013 0840   LYMPHSABS 3.6 07/16/2015 1036   LYMPHSABS 0.8* 02/02/2013 0840   MONOABS 1.2* 07/16/2015 1036   MONOABS 0.3 02/02/2013  0840   EOSABS 0.2 07/16/2015 1036   EOSABS 0.0 02/02/2013 0840   BASOSABS 0.1 07/16/2015 1036   BASOSABS 0.0 02/02/2013 0840   CMP     Component Value Date/Time   NA 141 07/12/2015 1234   NA 140 02/02/2013 0840   K 4.1 07/12/2015 1234   K 3.9 02/02/2013 0840   CL 112* 07/12/2015 1234   CL 97* 02/02/2013 0840   CO2 22 07/12/2015 1234   CO2 30* 02/02/2013 0840   GLUCOSE 100* 07/12/2015 1234   GLUCOSE 241* 02/02/2013 0840   BUN 18 07/12/2015 1234   BUN 25.7 02/02/2013 0840   CREATININE 1.31* 07/12/2015 1234   CREATININE 1.27* 01/24/2015 1106   CREATININE 1.4* 02/02/2013 0840   CALCIUM 9.2 07/12/2015 1234   CALCIUM 8.7 02/02/2013 0840   PROT 6.5 07/12/2015 1234   PROT 5.9* 02/02/2013 0840   ALBUMIN 3.6 07/12/2015 1234   ALBUMIN 3.1* 02/02/2013 0840   AST 33 07/12/2015 1234   AST 23 02/02/2013 0840   ALT 40 07/12/2015 1234   ALT 67* 02/02/2013 0840   ALKPHOS 58 07/12/2015 1234   ALKPHOS 60 02/02/2013 0840   BILITOT 0.6 07/12/2015 1234   BILITOT 0.68  02/02/2013 0840   GFRNONAA 47* 07/12/2015 1234   GFRAA 54* 07/12/2015 1234    ASSESSMENT and THERAPY PLAN:   ITP  49 year old female with ITP diagnosed in 2011. She failed splenectomy. She is very responsive to steroids but quickly relapsed after discontinuing them. She is currently on dexamethasone 0.5 mg twice daily.She has never tried any of the newer medications for ITP.   I have discussed with her that a reasonable platelet count is typically considered greater than 60,000. It would not be unreasonable to discontinue her dexamethasone as a taper and follow her platelet counts. We discussed this at her last visit but she is not willing to discontinue.  Screening  She will be due for another mammogram in March. We will order this for her.   She will be referred to Dr. Laural Golden per her request. She recently presented to the ED with rectal bleeding.   This document serves as a record of services personally performed by Ancil Linsey, MD. It was created on her behalf by Janace Hoard, a trained medical scribe. The creation of this record is based on the scribe's personal observations and the provider's statements to them. This document has been checked and approved by the attending provider.  I have reviewed the above documentation for accuracy and completeness, and I agree with the above.  This note was electronically signed.   Kelby Fam. Whitney Muse, MD

## 2015-08-26 IMAGING — NM NM MYOCAR SINGLE W/SPECT W/WALL MOTION & EF
2 series · 12 of 12 positions shown · non-contrast
Comparison: None.

CLINICAL DATA: 48-year-old woman with chest pain and hypertension
referred for an ischemic evaluation.

EXAM:
MYOCARDIAL IMAGING WITH SPECT (REST AND PHARMACOLOGIC-STRESS)
GATED LEFT VENTRICULAR WALL MOTION STUDY
LEFT VENTRICULAR EJECTION FRACTION
TECHNIQUE: Standard myocardial SPECT imaging was performed after resting
intravenous injection of 10 mCi Gc-44m sestamibi. Subsequently,
intravenous infusion of Lexiscan was performed under the supervision
of the Cardiology staff. At peak effect of the drug, 30 mCi Gc-44m
sestamibi was injected intravenously and standard myocardial SPECT
imaging was performed. Quantitative gated imaging was also performed
to evaluate left ventricular wall motion, and estimate left
ventricular ejection fraction.

[Series 1: stress gated · 8.28mm/px · 6 of 64 frames shown (1 of 2)]
[frame 6/64]
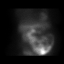
[frame 16/64]
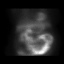
[frame 27/64]
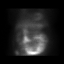
[frame 38/64]
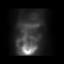
[frame 48/64]
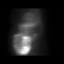
[frame 59/64]
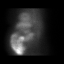

[Series 1: stress gated · 8.28mm/px · 6 of 512 frames shown (2 of 2)]
[frame 43/512]
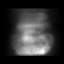
[frame 128/512]
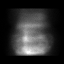
[frame 214/512]
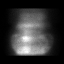
[frame 299/512]
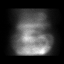
[frame 384/512]
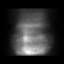
[frame 470/512]
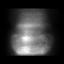

[12 of 12 positions shown; findings below may reference images not displayed]

FINDINGS: The patient was stressed according to the Lexiscan protocol. The
resting heart rate of 67 beats per min rose to a maximal heart rate
of 96 beats per min. The resting blood pressure 119/98 rose to
136/102. No chest pain was reported.

Resting ECG demonstrated normal sinus rhythm. With stress there were
no diagnostic ST segment or T-wave abnormalities.

Analysis of the raw data did not demonstrate any significant
extracardiac radiotracer uptake. Analysis of the perfusion images
demonstrated normal homogeneous radiotracer uptake in all wall
segments on stress and rest images. Left ventricular systolic
function and regional wall motion were normal, calculated LVEF 72%.
IMPRESSION: 1.  Normal Lexiscan Cardiolite stress test.

2.  No evidence of myocardial ischemia or scar.

3.  Normal left ventricular systolic function, calculated LVEF 72%.

## 2015-10-04 IMAGING — CR DG CHEST 1V PORT
1 series · 1 of 1 positions shown · non-contrast
Comparison: 02/05/2014

CLINICAL DATA: Mid chest pain

EXAM:
PORTABLE CHEST - 1 VIEW

[portable]
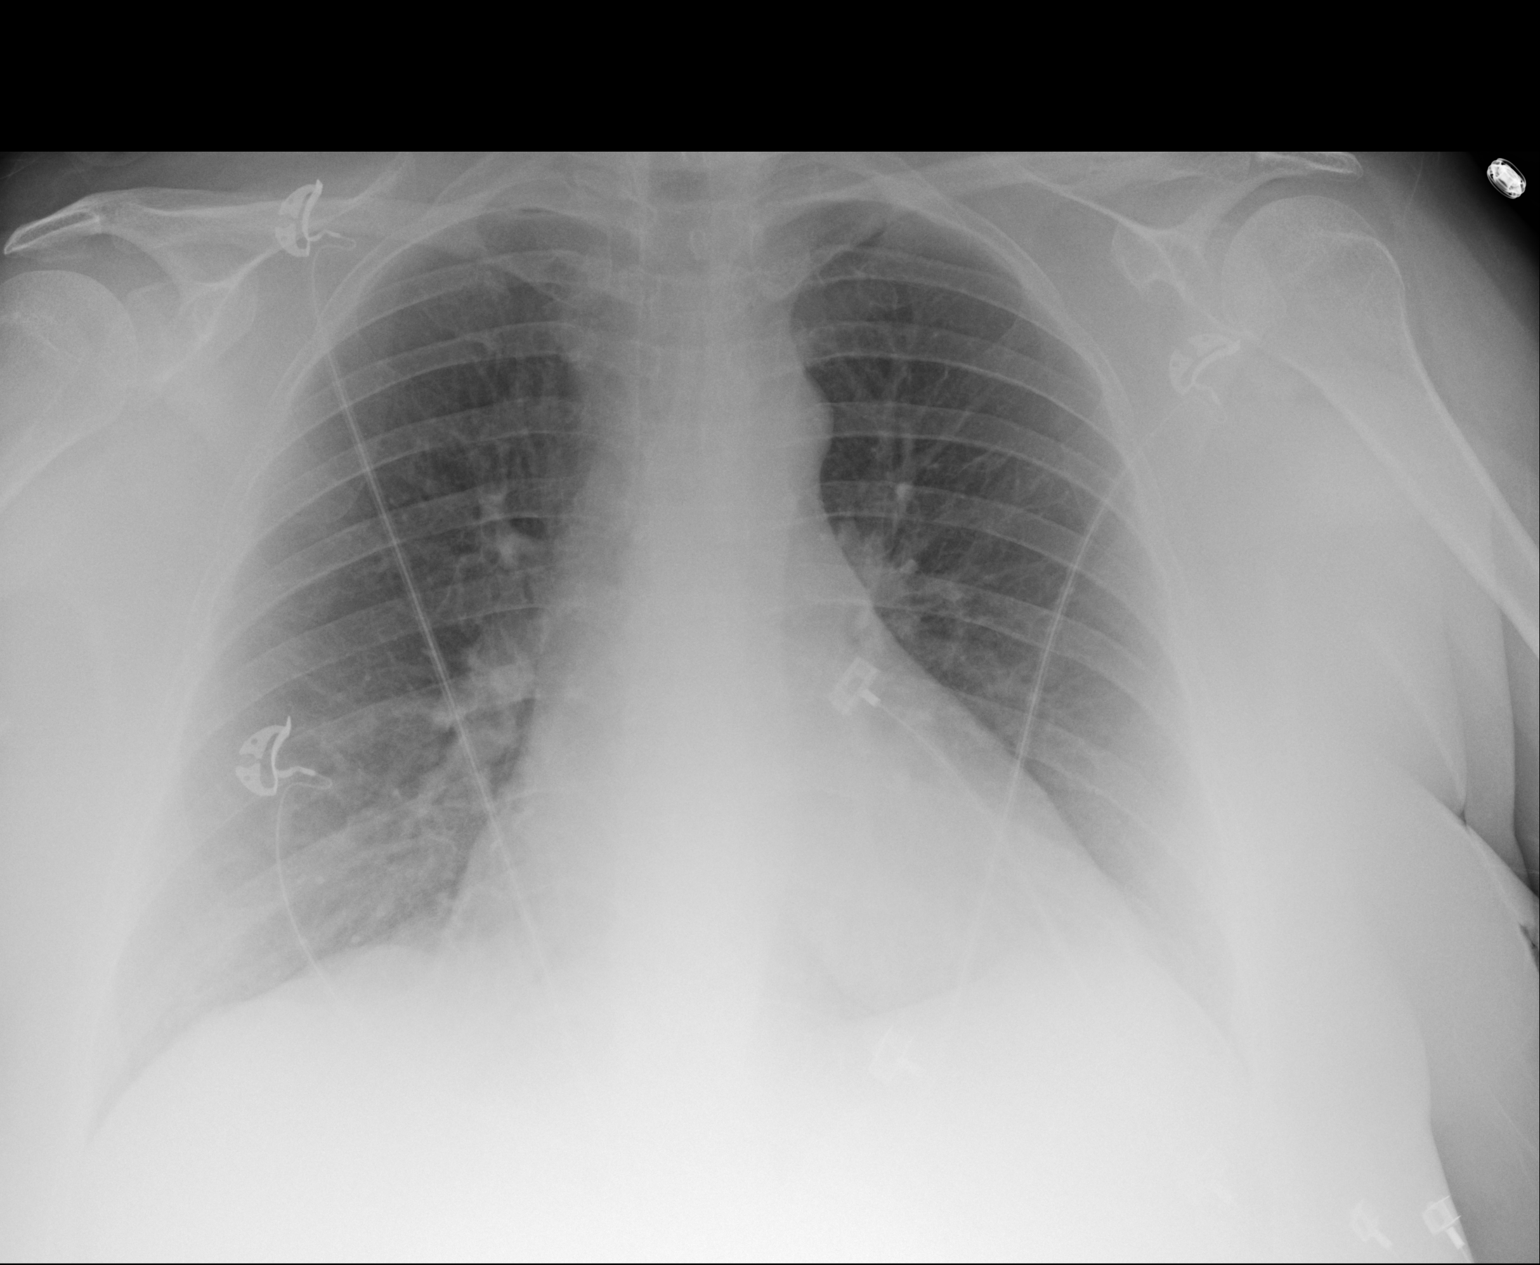

[1 of 1 positions shown; findings below may reference images not displayed]

FINDINGS: The heart size remains moderately enlarged without evidence for
edema. Both lungs are clear. The visualized skeletal structures are
unremarkable.
IMPRESSION: No active disease.

## 2015-10-04 IMAGING — CR DG ABDOMEN 2V
2 series · 2 of 2 positions shown · non-contrast
Comparison: CT abdomen and pelvis 01/21/2013

CLINICAL DATA: Abdominal pain and weakness.

EXAM:
ABDOMEN - 2 VIEW

[view not recorded (1 of 2)]
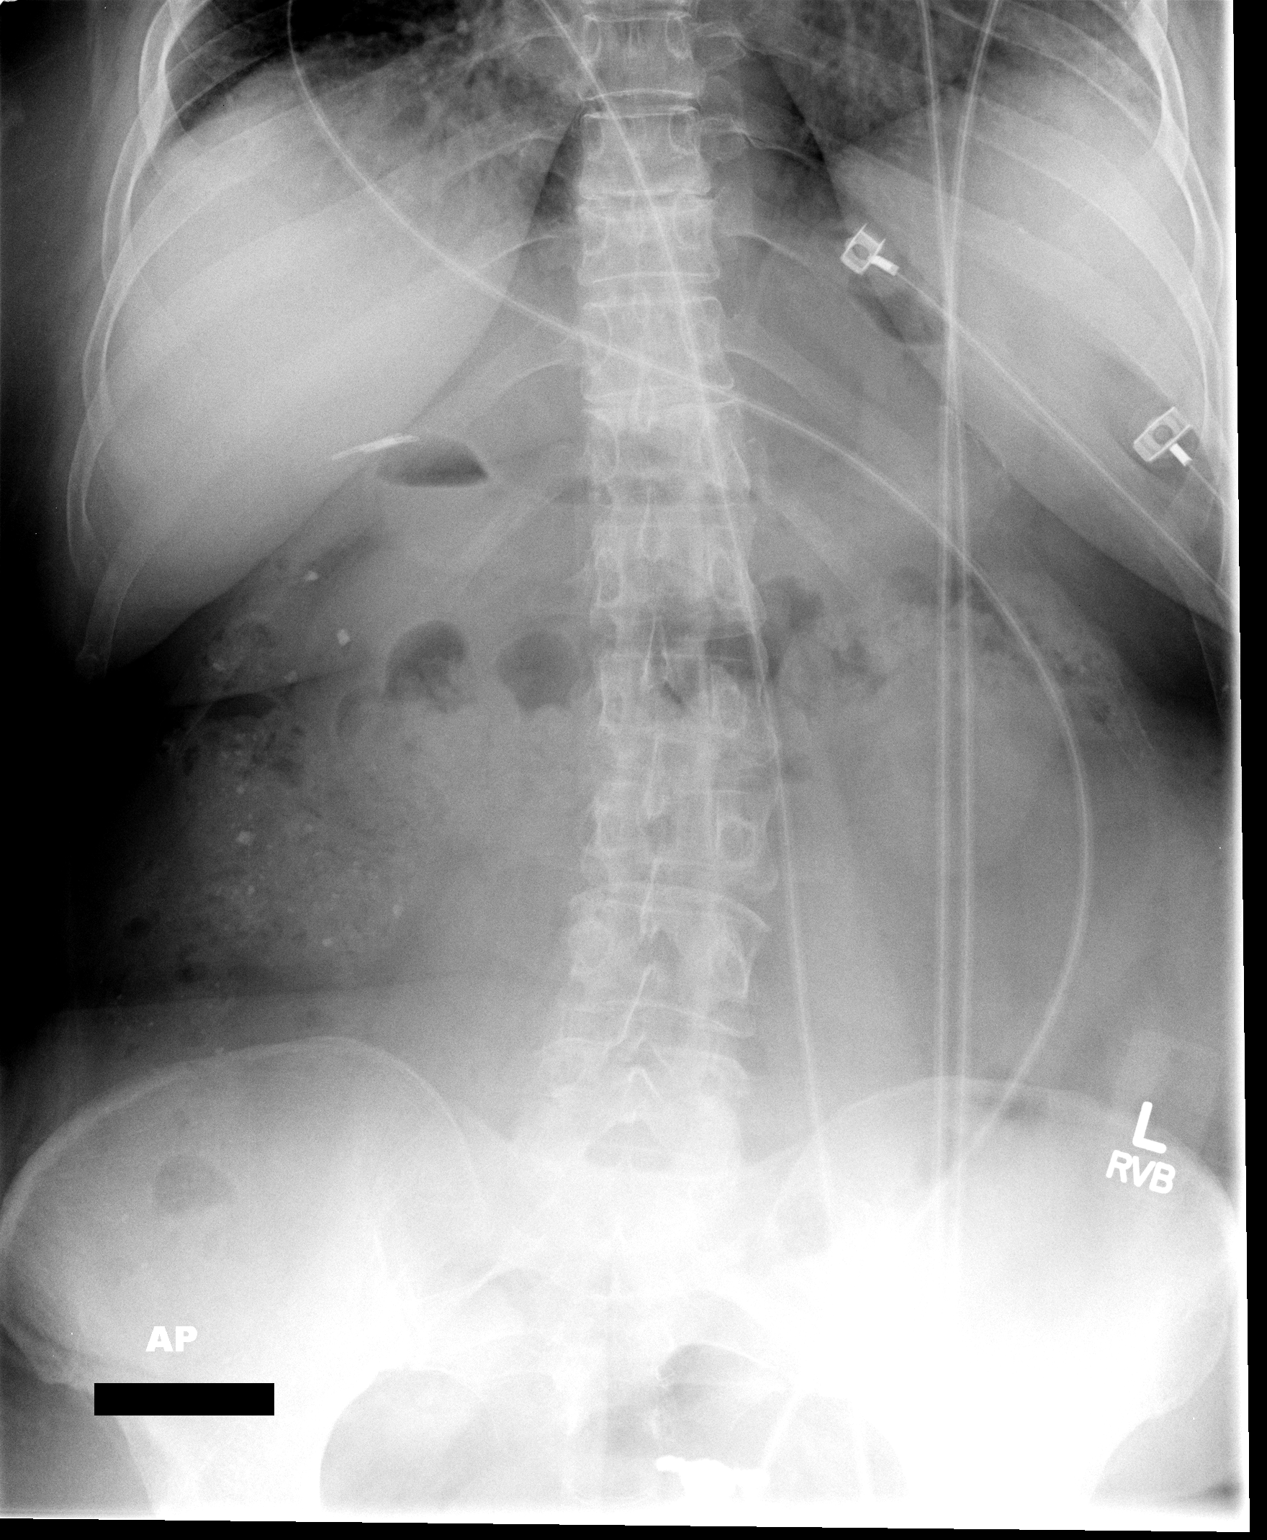

[view not recorded (2 of 2)]
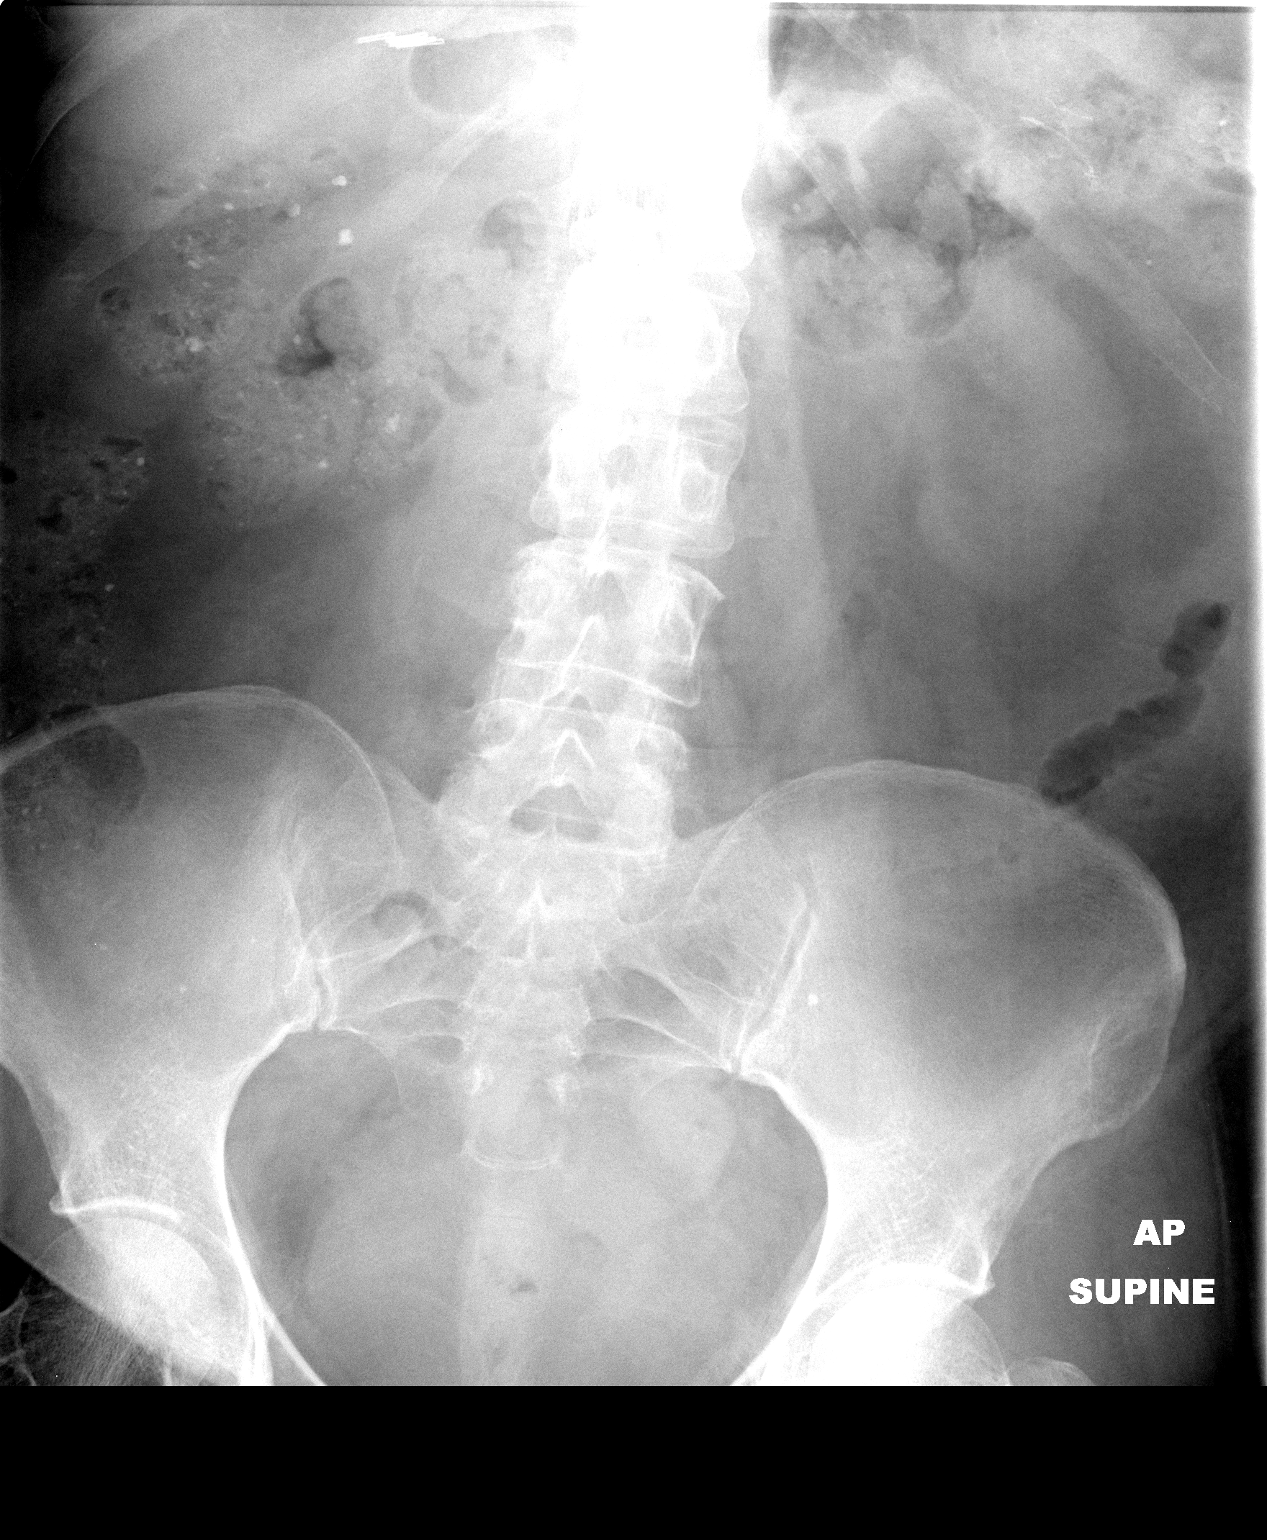

[2 of 2 positions shown; findings below may reference images not displayed]

FINDINGS: Right upper quadrant surgical clips are noted. There is a small to
moderate amount of stool present in the colon. There is a paucity of
small bowel gas. There is mild thoracolumbar levoscoliosis.
IMPRESSION: Small to moderate amount of colonic stool. Paucity of small bowel
gas, limiting evaluation for obstruction.

## 2015-10-10 ENCOUNTER — Telehealth (HOSPITAL_COMMUNITY): Payer: Self-pay | Admitting: Emergency Medicine

## 2015-10-10 ENCOUNTER — Encounter (HOSPITAL_COMMUNITY): Payer: Self-pay | Admitting: Hematology & Oncology

## 2015-10-10 MED ORDER — DEXAMETHASONE 0.5 MG PO TABS: 0.5000 mg | ORAL_TABLET | Freq: Two times a day (BID) | ORAL | Status: DC

## 2015-10-10 NOTE — Telephone Encounter (Signed)
Refilled pt dexamethasone, called pt and left a message

## 2015-10-11 ENCOUNTER — Encounter (HOSPITAL_COMMUNITY): Payer: Self-pay | Admitting: Hematology & Oncology

## 2015-10-29 ENCOUNTER — Ambulatory Visit: Payer: Medicare Other | Admitting: Internal Medicine

## 2015-10-29 ENCOUNTER — Other Ambulatory Visit (HOSPITAL_COMMUNITY): Payer: Self-pay | Admitting: Hematology & Oncology

## 2015-10-31 ENCOUNTER — Emergency Department (HOSPITAL_COMMUNITY)
Admission: EM | Admit: 2015-10-31 | Discharge: 2015-10-31 | Disposition: A | Discharge status: Home / Self Care | Payer: Medicare Other | Attending: Emergency Medicine | Admitting: Emergency Medicine

## 2015-10-31 ENCOUNTER — Encounter (HOSPITAL_COMMUNITY): Payer: Self-pay | Admitting: Emergency Medicine

## 2015-10-31 ENCOUNTER — Emergency Department (HOSPITAL_COMMUNITY): Payer: Medicare Other

## 2015-10-31 DIAGNOSIS — R51 Headache: Secondary | ICD-10-CM | POA: Insufficient documentation

## 2015-10-31 DIAGNOSIS — Z86711 Personal history of pulmonary embolism: Secondary | ICD-10-CM | POA: Insufficient documentation

## 2015-10-31 DIAGNOSIS — R0981 Nasal congestion: Secondary | ICD-10-CM | POA: Diagnosis not present

## 2015-10-31 DIAGNOSIS — E099 Drug or chemical induced diabetes mellitus without complications: Secondary | ICD-10-CM | POA: Diagnosis not present

## 2015-10-31 DIAGNOSIS — G8929 Other chronic pain: Secondary | ICD-10-CM | POA: Diagnosis not present

## 2015-10-31 DIAGNOSIS — R079 Chest pain, unspecified: Secondary | ICD-10-CM | POA: Insufficient documentation

## 2015-10-31 DIAGNOSIS — Z7952 Long term (current) use of systemic steroids: Secondary | ICD-10-CM | POA: Insufficient documentation

## 2015-10-31 DIAGNOSIS — Z87448 Personal history of other diseases of urinary system: Secondary | ICD-10-CM | POA: Insufficient documentation

## 2015-10-31 DIAGNOSIS — M549 Dorsalgia, unspecified: Secondary | ICD-10-CM | POA: Diagnosis not present

## 2015-10-31 DIAGNOSIS — M109 Gout, unspecified: Secondary | ICD-10-CM | POA: Insufficient documentation

## 2015-10-31 DIAGNOSIS — M7989 Other specified soft tissue disorders: Secondary | ICD-10-CM | POA: Diagnosis not present

## 2015-10-31 DIAGNOSIS — Z8619 Personal history of other infectious and parasitic diseases: Secondary | ICD-10-CM | POA: Diagnosis not present

## 2015-10-31 DIAGNOSIS — Z87891 Personal history of nicotine dependence: Secondary | ICD-10-CM | POA: Diagnosis not present

## 2015-10-31 DIAGNOSIS — R0989 Other specified symptoms and signs involving the circulatory and respiratory systems: Secondary | ICD-10-CM | POA: Diagnosis not present

## 2015-10-31 DIAGNOSIS — Z7982 Long term (current) use of aspirin: Secondary | ICD-10-CM | POA: Diagnosis not present

## 2015-10-31 DIAGNOSIS — F329 Major depressive disorder, single episode, unspecified: Secondary | ICD-10-CM | POA: Diagnosis not present

## 2015-10-31 DIAGNOSIS — I10 Essential (primary) hypertension: Secondary | ICD-10-CM | POA: Diagnosis not present

## 2015-10-31 DIAGNOSIS — Z794 Long term (current) use of insulin: Secondary | ICD-10-CM | POA: Diagnosis not present

## 2015-10-31 DIAGNOSIS — T380X5A Adverse effect of glucocorticoids and synthetic analogues, initial encounter: Secondary | ICD-10-CM | POA: Insufficient documentation

## 2015-10-31 DIAGNOSIS — R21 Rash and other nonspecific skin eruption: Secondary | ICD-10-CM | POA: Insufficient documentation

## 2015-10-31 DIAGNOSIS — J45901 Unspecified asthma with (acute) exacerbation: Secondary | ICD-10-CM | POA: Insufficient documentation

## 2015-10-31 DIAGNOSIS — R112 Nausea with vomiting, unspecified: Secondary | ICD-10-CM | POA: Diagnosis not present

## 2015-10-31 DIAGNOSIS — Z79899 Other long term (current) drug therapy: Secondary | ICD-10-CM | POA: Diagnosis not present

## 2015-10-31 DIAGNOSIS — R519 Headache, unspecified: Secondary | ICD-10-CM

## 2015-10-31 DIAGNOSIS — H53149 Visual discomfort, unspecified: Secondary | ICD-10-CM | POA: Diagnosis not present

## 2015-10-31 LAB — BASIC METABOLIC PANEL
Anion gap: 12 (ref 5–15)
BUN: 16 mg/dL (ref 6–20)
CALCIUM: 9.8 mg/dL (ref 8.9–10.3)
CO2: 22 mmol/L (ref 22–32)
CREATININE: 1.73 mg/dL — AB (ref 0.44–1.00)
Chloride: 107 mmol/L (ref 101–111)
GFR calc non Af Amer: 34 mL/min — ABNORMAL LOW (ref >60)
GFR, EST AFRICAN AMERICAN: 39 mL/min — AB (ref >60)
Glucose, Bld: 89 mg/dL (ref 65–99)
Potassium: 3.8 mmol/L (ref 3.5–5.1)
SODIUM: 141 mmol/L (ref 135–145)

## 2015-10-31 LAB — TROPONIN I

## 2015-10-31 LAB — CBC WITH DIFFERENTIAL/PLATELET
BASOS ABS: 0.1 10*3/uL (ref 0.0–0.1)
Basophils Relative: 1 %
Eosinophils Absolute: 0.4 10*3/uL (ref 0.0–0.7)
Eosinophils Relative: 4 %
HEMATOCRIT: 51.1 % — AB (ref 36.0–46.0)
Hemoglobin: 16.9 g/dL — ABNORMAL HIGH (ref 12.0–15.0)
LYMPHS ABS: 3.2 10*3/uL (ref 0.7–4.0)
LYMPHS PCT: 34 %
MCH: 28.8 pg (ref 26.0–34.0)
MCHC: 33.1 g/dL (ref 30.0–36.0)
MCV: 87.1 fL (ref 78.0–100.0)
MONO ABS: 1.3 10*3/uL — AB (ref 0.1–1.0)
MONOS PCT: 14 %
NEUTROS ABS: 4.3 10*3/uL (ref 1.7–7.7)
Neutrophils Relative %: 46 %
Platelets: 105 10*3/uL — ABNORMAL LOW (ref 150–400)
RBC: 5.87 MIL/uL — ABNORMAL HIGH (ref 3.87–5.11)
RDW: 15.2 % (ref 11.5–15.5)
WBC: 9.2 10*3/uL (ref 4.0–10.5)

## 2015-10-31 MED ORDER — METOCLOPRAMIDE HCL 5 MG/ML IJ SOLN
10.0000 mg | Freq: Once | INTRAMUSCULAR | Status: AC
Start: 2015-10-31 — End: 2015-10-31
  Administered 2015-10-31: 10 mg via INTRAVENOUS
  Filled 2015-10-31: qty 2

## 2015-10-31 MED ORDER — ONDANSETRON HCL 4 MG/2ML IJ SOLN
4.0000 mg | Freq: Once | INTRAMUSCULAR | Status: AC
  Administered 2015-10-31: 4 mg via INTRAVENOUS
  Filled 2015-10-31: qty 2

## 2015-10-31 MED ORDER — SODIUM CHLORIDE 0.9 % IV SOLN: INTRAVENOUS | Status: DC

## 2015-10-31 MED ORDER — DEXAMETHASONE SODIUM PHOSPHATE 4 MG/ML IJ SOLN
10.0000 mg | Freq: Once | INTRAMUSCULAR | Status: AC
  Administered 2015-10-31: 10 mg via INTRAVENOUS
  Filled 2015-10-31: qty 3

## 2015-10-31 MED ORDER — SODIUM CHLORIDE 0.9 % IV BOLUS (SEPSIS)
1000.0000 mL | Freq: Once | INTRAVENOUS | Status: AC
  Administered 2015-10-31: 1000 mL via INTRAVENOUS

## 2015-10-31 MED ORDER — DIPHENHYDRAMINE HCL 50 MG/ML IJ SOLN
25.0000 mg | Freq: Once | INTRAMUSCULAR | Status: AC
Start: 2015-10-31 — End: 2015-10-31
  Administered 2015-10-31: 25 mg via INTRAVENOUS
  Filled 2015-10-31: qty 1

## 2015-10-31 NOTE — ED Notes (Signed)
Pt states that she has had a headache off and on for a week.

## 2015-10-31 NOTE — ED Provider Notes (Signed)
CSN: [id number]     Arrival date & time 10/31/15  1207 History  By signing my name below, I, Summer Cook, attest that this documentation has been prepared under the direction and in the presence of Summer Sorrow, MD. Electronically Signed: Terressa Cook, ED Scribe. 10/31/2015. 12:56 PM.  Chief Complaint  Patient presents with  . Headache  The history is provided by the patient. No language interpreter was used.   PCP: Hoyt Koch, MD HPI Comments: Summer Cook is a 50 y.o. female, with PMHx noted below including headaches, who presents to the Emergency Department complaining of recurrent, intermittent, 10/10, generalized, throbbing headache onset one week ago. Associated Sx include n/v, photophobia, nasal congestion. Pt notes current headache Sx are consistent with prior headaches.   Pt also complains of intermittent, centralized chest pain with each episode lasting approximately one hour onset yesterday--pt notes current chest pain Sx are consistent with prior episodes.   Pt further confirms SOB, rash below breasts bilaterally, chronic back pain and swelling of legs. Pt denies fever, chills, rhinorrhea, sore throat, cough, blurred vision, abd pain, v/d, dysuria, hematuria, lightheadedness. Pt further denies Hx of bleeding easily/blood thinner use.   Past Medical History  Diagnosis Date  . Essential hypertension, benign   . 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  Last was Prednisone 66m; 2 wk in 10/2012.  She also was on Prednisone bridged with Cellcept briefly but stopped due to lack of insurance.   . Depression   . Thrush, oral 05/29/2011  . 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 [place]  . Asthma   . Renal insufficiency   . Chronic chest pain   . Normal cardiac stress test 02/2014  . Gout    Past Surgical History  Procedure Laterality Date   . Cholecystectomy  2008  . Bone marrow biopsy    . 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 Colonoscopy with esophagogastroduodenoscopy (egd) N/A 01/04/2013    Dr. RGala Romney esophageal plaques with +KOH, hh. Gastric antrum abnormal, bx reactive gastropathy. Anal canal hemorrhoids, colonic tics, normal TI, single polyp (sessile serrated tubular adenoma). Next TCS 12/2017  . Cataract extraction     Family History  Problem Relation Age of Onset  . Cervical cancer Mother   . Lung cancer Father   . Pneumonia Brother   . Cancer Paternal Grandmother 848   colon cancer, breast cancer  . Cancer Paternal Grandfather 624   colon cancer  . AAA (abdominal aortic aneurysm) Paternal Grandmother    Social History  Substance Use Topics  . Smoking status: Former Smoker -- 0 years    Types: Cigarettes    Quit date: 02/14/2009  . Smokeless tobacco: Never Used  . Alcohol Use: No   OB History    No data available     Review of Systems  Constitutional: Negative for fever and chills.  HENT: Positive for congestion. Negative for rhinorrhea and sore throat.   Eyes: Positive for photophobia. Negative for visual disturbance.  Respiratory: Positive for shortness of breath. Negative for cough.   Cardiovascular: Positive for chest pain and leg swelling.  Gastrointestinal: Positive for nausea and vomiting. Negative for abdominal pain and diarrhea.  Genitourinary: Negative for dysuria.  Musculoskeletal: Positive for back pain (chronic ).  Skin: Positive for rash (recurrent).  Neurological: Positive for headaches. Negative for light-headedness.  Hematological: Does not bruise/bleed easily.  Psychiatric/Behavioral: Negative for  confusion.   Allergies  Brintellix; Yellow jacket venom; and Adhesive  Home Medications   Prior to Admission medications   Medication Sig Start Date End Date Taking? Authorizing Provider  ALPRAZolam Duanne Moron) 1 MG tablet Take 1 mg by mouth 3 (three) times daily as needed for anxiety.    Yes  Historical Provider, MD  aspirin-acetaminophen-caffeine (EXCEDRIN MIGRAINE) [phone] MG tablet Take 2 tablets by mouth every 6 (six) hours as needed for headache.   Yes Historical Provider, MD  calcitonin, salmon, (MIACALCIN/FORTICAL) 200 UNIT/ACT nasal spray Place 1 spray into alternate nostrils daily.   Yes Historical Provider, MD  dexamethasone (DECADRON) 0.5 MG tablet Take 1 tablet (0.5 mg total) by mouth 2 (two) times daily. 10/10/15  Yes Patrici Ranks, MD  furosemide (LASIX) 20 MG tablet Take 20-40 mg by mouth 2 (two) times daily as needed for fluid.  12/16/14  Yes Historical Provider, MD  insulin aspart (NOVOLOG) 100 UNIT/ML injection Inject 2-5 Units into the skin daily as needed for high blood sugar. Per sliding scale.   Yes Historical Provider, MD  ipratropium-albuterol (DUONEB) 0.5-2.5 (3) MG/3ML SOLN Take 3 mLs by nebulization 3 (three) times daily as needed (shorntess of breath/wheezing). 02/18/15  Yes Kathie Dike, MD  KLOR-CON 10 10 MEQ tablet Take 10 mEq by mouth as needed (Only takes with the furosemide).  11/13/14  Yes Historical Provider, MD  metoprolol succinate (TOPROL-XL) 50 MG 24 hr tablet Take 1 tablet (50 mg total) by mouth daily. 01/25/15  Yes Satira Sark, MD  OVER THE COUNTER MEDICATION Place 1 drop into both eyes daily as needed (Dry Eyes).   Yes Historical Provider, MD  PROAIR RESPICLICK 154 (90 BASE) MCG/ACT AEPB Inhale 2 puffs into the lungs every 4 (four) hours as needed (Shortness of breath).  12/27/14  Yes Historical Provider, MD  sertraline (ZOLOFT) 50 MG tablet Take 150 mg by mouth daily.    Yes Historical Provider, MD   Triage Vitals: BP 118/88 mmHg  Pulse 107  Temp(Src) 98.8 F (37.1 C) (Temporal)  Resp 20  Ht _0  (1.651 m)  Wt 216 lb (97.977 kg)  BMI 35.94 kg/m2  SpO2 96%  LMP 05/13/2011 Physical Exam  Constitutional: She is oriented to person, place, and time. She appears well-developed and well-nourished. No distress.  HENT:  Head:  Normocephalic and atraumatic.  Mouth/Throat: Mucous membranes are normal.  Eyes: EOM are normal. Pupils are equal, round, and reactive to light. No scleral icterus.  Eyes track normal   Neck: Normal range of motion.  Cardiovascular: Normal rate, regular rhythm and normal heart sounds.   Pulmonary/Chest: Effort normal and breath sounds normal.  Abdominal: Soft. Bowel sounds are normal. She exhibits no distension. There is no tenderness.  Musculoskeletal: Normal range of motion. She exhibits no edema (No pitting edema to bilateral ankles ).  Neurological: She is alert and oriented to person, place, and time.  CN 2-12 grossly intact. 5/5 strength in all 4 extremities. Grossly normal sensation.    Skin: Skin is warm and dry.  Psychiatric: She has a normal mood and affect. Judgment normal.  Nursing note and vitals reviewed.   ED Course  Procedures (including critical care time) DIAGNOSTIC STUDIES: Oxygen Saturation is 96% on ra, nl by my interpretation.    COORDINATION OF CARE: 12:52 PM: Discussed treatment plan which includes imaging and labs with pt at bedside; patient verbalizes understanding and agrees with treatment plan.  Labs Review Labs Reviewed  CBC WITH DIFFERENTIAL/PLATELET - Abnormal; Notable  for the following:    RBC 5.87 (*)    Hemoglobin 16.9 (*)    HCT 51.1 (*)    Platelets 105 (*)    Monocytes Absolute 1.3 (*)    All other components within normal limits  BASIC METABOLIC PANEL - Abnormal; Notable for the following:    Creatinine, Ser 1.73 (*)    GFR calc non Af Amer 34 (*)    GFR calc Af Amer 39 (*)    All other components within normal limits  TROPONIN I   Results for orders placed or performed during the hospital encounter of 10/31/15  CBC with Differential/Platelet  Result Value Ref Range   WBC 9.2 4.0 - 10.5 K/uL   RBC 5.87 (H) 3.87 - 5.11 MIL/uL   Hemoglobin 16.9 (H) 12.0 - 15.0 g/dL   HCT 51.1 (H) 36.0 - 46.0 %   MCV 87.1 78.0 - 100.0 fL   MCH 28.8  26.0 - 34.0 pg   MCHC 33.1 30.0 - 36.0 g/dL   RDW 15.2 11.5 - 15.5 %   Platelets 105 (L) 150 - 400 K/uL   Neutrophils Relative % 46 %   Neutro Abs 4.3 1.7 - 7.7 K/uL   Lymphocytes Relative 34 %   Lymphs Abs 3.2 0.7 - 4.0 K/uL   Monocytes Relative 14 %   Monocytes Absolute 1.3 (H) 0.1 - 1.0 K/uL   Eosinophils Relative 4 %   Eosinophils Absolute 0.4 0.0 - 0.7 K/uL   Basophils Relative 1 %   Basophils Absolute 0.1 0.0 - 0.1 K/uL  Basic metabolic panel  Result Value Ref Range   Sodium 141 135 - 145 mmol/L   Potassium 3.8 3.5 - 5.1 mmol/L   Chloride 107 101 - 111 mmol/L   CO2 22 22 - 32 mmol/L   Glucose, Bld 89 65 - 99 mg/dL   BUN 16 6 - 20 mg/dL   Creatinine, Ser 1.73 (H) 0.44 - 1.00 mg/dL   Calcium 9.8 8.9 - 10.3 mg/dL   GFR calc non Af Amer 34 (L) >60 mL/min   GFR calc Af Amer 39 (L) >60 mL/min   Anion gap 12 5 - 15  Troponin I  Result Value Ref Range   Troponin I <0.03 <0.031 ng/mL   Imaging Review Dg Chest 2 View  10/31/2015  CLINICAL DATA:  Nasal congestion, centralized chest pain starting yesterday. EXAM: CHEST  2 VIEW COMPARISON:  Feb 16, 2015 FINDINGS: The heart size and mediastinal contours are within normal limits. There is no focal infiltrate, pulmonary edema, or pleural effusion. The visualized skeletal structures are unremarkable. Prior cholecystectomy clips are identified. IMPRESSION: No active cardiopulmonary disease. Electronically Signed   By: Abelardo Diesel M.D.   On: 10/31/2015 14:16   Ct Head Wo Contrast  10/31/2015  CLINICAL DATA:  Headache for 1 week with nausea, vomiting and photophobia. EXAM: CT HEAD WITHOUT CONTRAST TECHNIQUE: Contiguous axial images were obtained from the base of the skull through the vertex without intravenous contrast. COMPARISON:  Head CT scan 04/05/2013.  Brain MRI 02/28/2013. FINDINGS: No acute intracranial abnormality including hemorrhage, infarct, mass lesion, mass effect, midline shift or abnormal extra-axial fluid collection is  identified. Small focus of hypoattenuation in the deep white matter of the right frontal lobe is new since the prior study. There is no pneumocephalus or hydrocephalus. The calvarium is intact. Imaged paranasal sinuses and mastoid air cells are clear. IMPRESSION: No acute abnormality. Small focus of decreased attenuation in the deep white matter of  the right frontal lobe is new since the prior examinations may be due to microvascular ischemic change. Electronically Signed   By: Inge Rise M.D.   On: 10/31/2015 14:15   I have personally reviewed and evaluated these images and lab results as part of my medical decision-making.   EKG Interpretation   Date/Time:  Thursday October 31 2015 13:21:08 EST Ventricular Rate:  98 PR Interval:  173 QRS Duration: 142 QT Interval:  398 QTC Calculation: 508 R Axis:   -81 Text Interpretation:  Sinus rhythm Nonspecific IVCD with LAD Left  ventricular hypertrophy Anterior infarct, old Confirmed by Macario Shear  MD,  Brittanni Cariker [phone]) on 10/31/2015 1:31:39 PM      MDM   Final diagnoses:  Acute intractable headache, unspecified headache type    Workup for the headache without any acute findings. Head CT without evidence of sinus infections or any other problems other than a small focus of attenuation in the frontal lobe which cannot be connected to the patient's symptoms. Patient also had the complaint of chest pain and shortness of breath. Chest x-ray was negative for any signs of pneumonia pneumothorax or pulmonary edema. EKG does show a new nonspecific intra-ventricular conduction delay. Do not feel is playing a role and today's symptoms. Patient without a leukocytosis some evidence of renal insufficiency will require follow-up with her regular doctor. Patient's headache improved the significantly with treatment here migraine cocktail which included Reglan Zofran Benadryl and Decadron but not has not completely resolved. Patient nontoxic no acute  distress.  I personally performed the services described in this documentation, which was scribed in my presence. The recorded information has been reviewed and is accurate.      Summer Sorrow, MD 10/31/15 [phone]

## 2015-10-31 NOTE — ED Notes (Signed)
Pt does not want to put on gown.

## 2015-10-31 NOTE — Discharge Instructions (Signed)
Workup without any significant findings. Return for any new or worse symptoms. We expect the headache medication to improve and resolve the headache overnight.

## 2015-11-07 ENCOUNTER — Other Ambulatory Visit (HOSPITAL_COMMUNITY): Payer: Self-pay | Admitting: Hematology & Oncology

## 2015-11-08 ENCOUNTER — Other Ambulatory Visit (HOSPITAL_COMMUNITY): Payer: Self-pay | Admitting: Hematology & Oncology

## 2015-11-08 ENCOUNTER — Telehealth (HOSPITAL_COMMUNITY): Payer: Self-pay | Admitting: *Deleted

## 2015-11-11 ENCOUNTER — Encounter (HOSPITAL_COMMUNITY): Payer: Self-pay | Admitting: *Deleted

## 2015-11-11 NOTE — Progress Notes (Signed)
Decadron 0.5 mg bid with one refill escribed to pt's pharmacy.  Pt notified via MyChart that this Rx was sent.  Also instructed as per Caroleen Hamman, PA to contact PCP re:  Xanax refill.

## 2015-11-18 ENCOUNTER — Ambulatory Visit (INDEPENDENT_AMBULATORY_CARE_PROVIDER_SITE_OTHER): Payer: Medicare Other | Admitting: Internal Medicine

## 2015-11-18 ENCOUNTER — Encounter: Payer: Self-pay | Admitting: Internal Medicine

## 2015-11-18 ENCOUNTER — Other Ambulatory Visit (INDEPENDENT_AMBULATORY_CARE_PROVIDER_SITE_OTHER): Payer: Medicare Other

## 2015-11-18 VITALS — BP 110/82 | HR 82 | Temp 97.8°F | Resp 14 | Ht 65.0 in | Wt 235.1 lb

## 2015-11-18 DIAGNOSIS — R5383 Other fatigue: Secondary | ICD-10-CM

## 2015-11-18 DIAGNOSIS — D693 Immune thrombocytopenic purpura: Secondary | ICD-10-CM | POA: Diagnosis not present

## 2015-11-18 DIAGNOSIS — R197 Diarrhea, unspecified: Secondary | ICD-10-CM

## 2015-11-18 DIAGNOSIS — I2699 Other pulmonary embolism without acute cor pulmonale: Secondary | ICD-10-CM

## 2015-11-18 DIAGNOSIS — E099 Drug or chemical induced diabetes mellitus without complications: Secondary | ICD-10-CM

## 2015-11-18 DIAGNOSIS — T380X5A Adverse effect of glucocorticoids and synthetic analogues, initial encounter: Secondary | ICD-10-CM

## 2015-11-18 DIAGNOSIS — E538 Deficiency of other specified B group vitamins: Secondary | ICD-10-CM | POA: Diagnosis not present

## 2015-11-18 DIAGNOSIS — J4520 Mild intermittent asthma, uncomplicated: Secondary | ICD-10-CM

## 2015-11-18 DIAGNOSIS — N183 Chronic kidney disease, stage 3 unspecified: Secondary | ICD-10-CM

## 2015-11-18 DIAGNOSIS — Z79899 Other long term (current) drug therapy: Secondary | ICD-10-CM | POA: Diagnosis not present

## 2015-11-18 DIAGNOSIS — F411 Generalized anxiety disorder: Secondary | ICD-10-CM

## 2015-11-18 DIAGNOSIS — M5440 Lumbago with sciatica, unspecified side: Secondary | ICD-10-CM

## 2015-11-18 DIAGNOSIS — Z9081 Acquired absence of spleen: Secondary | ICD-10-CM

## 2015-11-18 DIAGNOSIS — I10 Essential (primary) hypertension: Secondary | ICD-10-CM

## 2015-11-18 LAB — COMPREHENSIVE METABOLIC PANEL
ALBUMIN: 3.9 g/dL (ref 3.5–5.2)
ALT: 28 U/L (ref 0–35)
AST: 26 U/L (ref 0–37)
Alkaline Phosphatase: 62 U/L (ref 39–117)
BUN: 25 mg/dL — AB (ref 6–23)
CHLORIDE: 105 meq/L (ref 96–112)
CO2: 26 mEq/L (ref 19–32)
Calcium: 9.1 mg/dL (ref 8.4–10.5)
Creatinine, Ser: 1.49 mg/dL — ABNORMAL HIGH (ref 0.40–1.20)
GFR: 39.38 mL/min — ABNORMAL LOW (ref >60.00)
Glucose, Bld: 106 mg/dL — ABNORMAL HIGH (ref 70–99)
POTASSIUM: 3.9 meq/L (ref 3.5–5.1)
SODIUM: 141 meq/L (ref 135–145)
Total Bilirubin: 0.4 mg/dL (ref 0.2–1.2)
Total Protein: 6.6 g/dL (ref 6.0–8.3)

## 2015-11-18 LAB — CBC
HEMATOCRIT: 47.2 % — AB (ref 36.0–46.0)
HEMOGLOBIN: 15.4 g/dL — AB (ref 12.0–15.0)
MCHC: 32.6 g/dL (ref 30.0–36.0)
MCV: 85 fl (ref 78.0–100.0)
PLATELETS: 180 10*3/uL (ref 150.0–400.0)
RBC: 5.55 Mil/uL — ABNORMAL HIGH (ref 3.87–5.11)
RDW: 15.3 % (ref 11.5–15.5)
WBC: 9.5 10*3/uL (ref 4.0–10.5)

## 2015-11-18 LAB — TSH: TSH: 1.74 u[IU]/mL (ref 0.35–4.50)

## 2015-11-18 LAB — VITAMIN B12: VITAMIN B 12: 793 pg/mL (ref 211–911)

## 2015-11-18 LAB — HEMOGLOBIN A1C: HEMOGLOBIN A1C: 6.3 % (ref 4.6–6.5)

## 2015-11-18 LAB — CK: Total CK: 48 U/L (ref 7–177)

## 2015-11-18 LAB — T4, FREE: Free T4: 0.55 ng/dL — ABNORMAL LOW (ref 0.60–1.60)

## 2015-11-18 MED ORDER — PREGABALIN 100 MG PO CAPS: 100.0000 mg | ORAL_CAPSULE | Freq: Two times a day (BID) | ORAL | Status: DC

## 2015-11-18 NOTE — Progress Notes (Signed)
Pre visit review using our clinic review tool, if applicable. No additional management support is needed unless otherwise documented below in the visit note. 

## 2015-11-18 NOTE — Patient Instructions (Addendum)
I would strongly recommend to work with the hematologist and try to get off the steroids as they are severely impacting on your health. I will send them a note letting them know that we talked about this today. It is safe to come off the steroids and although it may take time your body does get used to being off the steroids and being tired does not sound like a typical reaction to not having steroids so there may be another cause.   We do not prescribe controlled substances on 1st visits but can try lyrica for the back pain. Take 1 pill twice daily for the pain.  We are checking labs today and will call you back with the results.   We have sent in flonase for the allergies which will help. Use 2 sprays in each nostril once daily. You do not need an antibiotic today.  We will have you talk to our GI for a second opinion and have placed the referral.

## 2015-11-19 ENCOUNTER — Telehealth: Payer: Self-pay | Admitting: Internal Medicine

## 2015-11-19 ENCOUNTER — Telehealth: Payer: Self-pay

## 2015-11-19 NOTE — Telephone Encounter (Signed)
PA initiated via CoverMyMeds Key [phone]

## 2015-11-19 NOTE — Telephone Encounter (Signed)
Tried to reach patient. No answer.  

## 2015-11-19 NOTE — Telephone Encounter (Signed)
Patient called to follow up on visit from yesterday - she states that [facility] does not have the lyrica on file. They have not received.  - also she states that she was under the impression that we are going to call in alprazolam and certriline. Scripts not on file. She states that these are not controlled as the visit note addresses.   Please call the patient

## 2015-11-19 NOTE — Progress Notes (Signed)
   Subjective:    Patient ID: Summer Cook, female    DOB: Dec 22, 1965, 50 y.o.   MRN: [id number]  HPI The patient is a 50 YO female coming in for several concerns. She has complicated PMH so please see A/P for status and treatment of chronic medical problems. She is having increased sinus drainage for the last 2-3 days. No fevers or chills. Mild cough with no production. No sick contacts. Not taking anything for it. Non-smoker for several years now.  Next concern is her back pain. She previously has been on hydrocodone and wants that again. She does have pain daily. Sometimes goes into her legs.  Next concern is her anxiety, she has previously been getting xanax 1 mg tabs for her anxiety and wants those back again as well. She is taking zoloft daily but not sure that it is helping much. Denies SI/HI.   PMH, [facility], social history reviewed and updated.   Review of Systems  Constitutional: Positive for activity change, fatigue and unexpected weight change. Negative for fever, chills and appetite change.  HENT: Negative.   Eyes: Negative.   Respiratory: Positive for shortness of breath. Negative for cough, chest tightness and wheezing.        With exertion  Cardiovascular: Negative for chest pain, palpitations and leg swelling.  Gastrointestinal: Negative for nausea, abdominal pain, diarrhea, constipation and abdominal distention.  Musculoskeletal: Positive for back pain and arthralgias. Negative for myalgias and gait problem.  Skin: Negative.   Neurological: Positive for weakness. Negative for dizziness, syncope, light-headedness and headaches.  Psychiatric/Behavioral: Positive for dysphoric mood and decreased concentration. Negative for suicidal ideas, sleep disturbance and self-injury. The patient is nervous/anxious.       Objective:   Physical Exam  Constitutional: She is oriented to person, place, and time. She appears well-developed and well-nourished.  obese  HENT:  Head:  Normocephalic and atraumatic.  Oropharynx clear drainage, nose without crusting, no sinus tenderness, TM normal  Eyes: EOM are normal.  Neck: Normal range of motion.  Cardiovascular: Normal rate and regular rhythm.   Pulmonary/Chest: Effort normal and breath sounds normal. No respiratory distress. She has no wheezes. She has no rales.  Abdominal: Soft. She exhibits no distension. There is no tenderness. There is no rebound.  Musculoskeletal: She exhibits no edema.  Neurological: She is alert and oriented to person, place, and time.  Skin: Skin is warm and dry.  Psychiatric: She has a normal mood and affect.   Filed Vitals:   11/18/15 1510  BP: 110/82  Pulse: 82  Temp: 97.8 F (36.6 C)  TempSrc: Oral  Resp: 14  Height: 5\' 5"  (1.651 m)  Weight: 235 lb 1.9 oz (106.65 kg)  SpO2: 96%      Assessment & Plan:

## 2015-11-20 ENCOUNTER — Telehealth: Payer: Self-pay | Admitting: Gastroenterology

## 2015-11-20 MED ORDER — SERTRALINE HCL 50 MG PO TABS: 150.0000 mg | ORAL_TABLET | Freq: Every day | ORAL | Status: DC

## 2015-11-20 NOTE — Telephone Encounter (Signed)
Patient aware of the refill. Dr. Sharlet Salina will let me know about the follow up visit after she reviews the patient's labs.

## 2015-11-20 NOTE — Telephone Encounter (Signed)
Pt has called back. Can you please call her at [phone]

## 2015-11-20 NOTE — Telephone Encounter (Signed)
Patient has seen [facility] within the past year. Patient is requesting a 2nd opinion regarding diarrhea. Records printed and placed on Dr. Woodward Ku desk.

## 2015-11-20 NOTE — Telephone Encounter (Signed)
Patient wants to know if you would refill her sertraline. She also wants to know when you want her to come back for a follow up visit. She says it was not printed on the AVS.

## 2015-11-20 NOTE — Telephone Encounter (Signed)
Sertraline refilled.  

## 2015-11-21 MED ORDER — GABAPENTIN 300 MG PO CAPS: 300.0000 mg | ORAL_CAPSULE | Freq: Three times a day (TID) | ORAL | Status: DC

## 2015-11-21 NOTE — Telephone Encounter (Signed)
Notified pt with md response.../lmb 

## 2015-11-21 NOTE — Telephone Encounter (Signed)
We have sent in gabapentin. For the first 3 days she can take once daily, then can increase to twice daily for the next 3 days then can increase to 3 times a day if not having side effects.

## 2015-11-21 NOTE — Telephone Encounter (Signed)
Receive call from pt stating that she call her insurance to check status on PA yesterday. She was told that the insurance will not cover Lyrica because its not DEA approve. Verifiied with pt did they give her names of alternative med. She stated no. Pt is going to call her insurance to see what they will cover, and give [place] a call back...Johny Chess

## 2015-11-21 NOTE — Telephone Encounter (Signed)
Patient called back stating Gabapentin would be covered under insurance.

## 2015-11-22 DIAGNOSIS — M549 Dorsalgia, unspecified: Secondary | ICD-10-CM | POA: Insufficient documentation

## 2015-11-22 NOTE — Assessment & Plan Note (Signed)
Due for pneumonia 23 every 5 years which would be Sept 2017.

## 2015-11-22 NOTE — Assessment & Plan Note (Signed)
Does take duonebs daily for her breathing and has albuterol for breakthrough usage.

## 2015-11-22 NOTE — Assessment & Plan Note (Signed)
Taking xanax previously and zoloft 150 mg daily. No rx for xanax today as we do not prescribe controlled substances on initial visit. Per [place] narcotic database last rx for this was in November for #90 and so she is not likely using daily and would not need #90 per month.

## 2015-11-22 NOTE — Assessment & Plan Note (Signed)
Needs tight control of BP and diabetes and talked to her today about the importance. Checking Hga1c and adjust as needed. Checking CMP for stability of renal function. Not on ACE-I or ARB and unclear why not.

## 2015-11-22 NOTE — Assessment & Plan Note (Signed)
Is complicated by CKD stage 3. Is on insulin only and taking novolog as she feels. Checking labs today and adjust as needed. Given her CKD she is not a candidate for metformin likely at this time. Not on ACE-I or ARB.

## 2015-11-22 NOTE — Assessment & Plan Note (Signed)
Informed her that we do not write prescriptions for controlled substances on initial visits. Rx for lyrica as it sounds that her back pain is neuropathic and this may be more helpful than the vicodin. Looking her up in the [place] narcotic database she has newly gotten the vicodin rx in the last 6 months and has not been on long term.

## 2015-11-22 NOTE — Assessment & Plan Note (Signed)
BP at goal on lasix and metoprolol. On metoprolol for fast heart rate so would continue. Checking CMP and adjust as needed.

## 2015-11-22 NOTE — Assessment & Plan Note (Signed)
Will communicate with her hematologist but feel that her health is suffering from the steroids and feel that she should try to come off and have given her that recommendation today. Last platelets were healthy and other treatment options are available that are less harmful to her.

## 2015-11-26 ENCOUNTER — Telehealth: Payer: Self-pay | Admitting: Internal Medicine

## 2015-11-26 NOTE — Telephone Encounter (Signed)
When do you want me to tell this patient to come back?

## 2015-11-26 NOTE — Telephone Encounter (Signed)
Recommend 3 months

## 2015-11-26 NOTE — Telephone Encounter (Signed)
Pt called about labs. I did tell her they were visible on MyChart. She was ok with that.  I did not see any notes from Dr. Sharlet Salina on them.   She is also wanting to know when she should come back to see her. This was not on her AVS.   Please advise

## 2015-11-27 ENCOUNTER — Telehealth: Payer: Self-pay | Admitting: Internal Medicine

## 2015-11-27 NOTE — Telephone Encounter (Signed)
From last labs patient's thyroid was a little low. She said she is willing to try medication if you think that's best. She said you can send it to her pharmacy. She also wanted to let you know that the pain medication is not working. Please advise, thanks.

## 2015-11-27 NOTE — Telephone Encounter (Signed)
Pt called in said her pain meds are not working or helping.  She would like for nurse to call her   Best number [phone]

## 2015-11-28 ENCOUNTER — Encounter (HOSPITAL_COMMUNITY): Payer: Medicare HMO | Attending: Hematology & Oncology | Admitting: Oncology

## 2015-11-28 ENCOUNTER — Encounter (HOSPITAL_COMMUNITY): Payer: Medicare HMO

## 2015-11-28 ENCOUNTER — Telehealth: Payer: Self-pay | Admitting: Internal Medicine

## 2015-11-28 ENCOUNTER — Encounter (HOSPITAL_COMMUNITY): Payer: Self-pay | Admitting: Oncology

## 2015-11-28 VITALS — BP 133/73 | HR 88 | Temp 98.0°F | Resp 18 | Wt 240.0 lb

## 2015-11-28 DIAGNOSIS — D693 Immune thrombocytopenic purpura: Secondary | ICD-10-CM

## 2015-11-28 DIAGNOSIS — Z0000 Encounter for general adult medical examination without abnormal findings: Secondary | ICD-10-CM

## 2015-11-28 LAB — CBC WITH DIFFERENTIAL/PLATELET
BASOS ABS: 0.1 10*3/uL (ref 0.0–0.1)
BASOS PCT: 1 %
Eosinophils Absolute: 0.4 10*3/uL (ref 0.0–0.7)
Eosinophils Relative: 4 %
HEMATOCRIT: 46.9 % — AB (ref 36.0–46.0)
HEMOGLOBIN: 14.6 g/dL (ref 12.0–15.0)
LYMPHS PCT: 28 %
Lymphs Abs: 2.8 10*3/uL (ref 0.7–4.0)
MCH: 27.4 pg (ref 26.0–34.0)
MCHC: 31.1 g/dL (ref 30.0–36.0)
MCV: 88 fL (ref 78.0–100.0)
MONO ABS: 0.9 10*3/uL (ref 0.1–1.0)
MONOS PCT: 10 %
NEUTROS ABS: 5.6 10*3/uL (ref 1.7–7.7)
NEUTROS PCT: 57 %
Platelets: 131 10*3/uL — ABNORMAL LOW (ref 150–400)
RBC: 5.33 MIL/uL — ABNORMAL HIGH (ref 3.87–5.11)
RDW: 15.4 % (ref 11.5–15.5)
WBC: 9.9 10*3/uL (ref 4.0–10.5)

## 2015-11-28 LAB — COMPREHENSIVE METABOLIC PANEL
ALBUMIN: 3.5 g/dL (ref 3.5–5.0)
ALK PHOS: 71 U/L (ref 38–126)
ALT: 27 U/L (ref 14–54)
AST: 30 U/L (ref 15–41)
Anion gap: 10 (ref 5–15)
BILIRUBIN TOTAL: 0.5 mg/dL (ref 0.3–1.2)
BUN: 29 mg/dL — AB (ref 6–20)
CALCIUM: 9.1 mg/dL (ref 8.9–10.3)
CO2: 25 mmol/L (ref 22–32)
Chloride: 105 mmol/L (ref 101–111)
Creatinine, Ser: 1.46 mg/dL — ABNORMAL HIGH (ref 0.44–1.00)
GFR calc Af Amer: 48 mL/min — ABNORMAL LOW (ref >60)
GFR, EST NON AFRICAN AMERICAN: 41 mL/min — AB (ref >60)
GLUCOSE: 119 mg/dL — AB (ref 65–99)
POTASSIUM: 4.4 mmol/L (ref 3.5–5.1)
Sodium: 140 mmol/L (ref 135–145)
TOTAL PROTEIN: 6.9 g/dL (ref 6.5–8.1)

## 2015-11-28 MED ORDER — LEVOTHYROXINE SODIUM 50 MCG PO TABS: 50.0000 ug | ORAL_TABLET | Freq: Every day | ORAL | Status: DC

## 2015-11-28 MED ORDER — DEXAMETHASONE 0.5 MG PO TABS: ORAL_TABLET | ORAL | Status: AC

## 2015-11-28 NOTE — Telephone Encounter (Signed)
Pt called in said that the pain med that Sharlet Salina gave her is not helping with the pain.  She said that it makes her head fuzzy .  She does not like it and would like something else

## 2015-11-28 NOTE — Progress Notes (Signed)
Summer Koch, MD [place] [address]  ITP (idiopathic thrombocytopenic purpura) - Plan: dexamethasone (DECADRON) 0.5 MG tablet, CBC  CURRENT THERAPY: Decadron 0.5 mg BID (with refusal to taper and discontinue).  INTERVAL HISTORY: Summer Cook 50 y.o. 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  I personally reviewed and went over laboratory results with the patient.  The results are noted within this dictation. Her blood counts stable today. It's over 131,000.  She denies any bleeding, abnormal bruising, rash.  She reports that she is only been taking her Decadron once daily. She thought that I was going to be upset with that, but is a great segue into my plan with her moving forward. She is educated on the detriments of long-term corticosteroid use. She is educated on the fact that I will taper her Decadron dose. She is extremely excited to hear this, which historically is not a typical reaction. She is educated that she very well may be in a remission from an ITP standpoint and therefore continuation of corticosteroids is contraindicated. She is so excited to hear the word "remission" she becomes almost tearful.  She is educated on the role of the taper of corticosteroids.   Past Medical History  Diagnosis Date  . Essential hypertension, benign   . 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  Last was Prednisone '40mg'$ ; 2 wk in 10/2012.  She also was on Prednisone bridged with Cellcept briefly but stopped due to lack of insurance.   . Depression   . Thrush, oral 05/29/2011  . Steroid-induced diabetes ([place])   . Pulmonary embolism ([place]) 04/2012  . Asthma   . Renal insufficiency   . Chronic chest pain   . Normal cardiac stress test 02/2014  . Gout   . Arthritis    . GERD (gastroesophageal reflux disease)   . Allergy     has Hypertension; ITP (idiopathic thrombocytopenic purpura); Tachycardia; Hemorrhoids; Steroid-induced diabetes ([place]); Post-splenectomy; Immunosuppressed status ([place]); Depression; Obesity; Renal insufficiency; PE (pulmonary embolism); Rectal bleeding; Chronic anticoagulation; Unspecified constipation; Nausea with vomiting; Abnormal LFTs; Orthostatic hypotension; CKD (chronic kidney disease) stage 3, GFR 30-59 ml/min; Asthma, chronic; Anxiety state; and Back pain on her problem list.     is allergic to brintellix; yellow jacket venom; and adhesive.  Current Outpatient Prescriptions on File Prior to Visit  Medication Sig Dispense Refill  . ALPRAZolam (XANAX) 1 MG tablet Take 1 mg by mouth 3 (three) times daily as needed for anxiety. Reported on 11/18/2015    . aspirin-acetaminophen-caffeine (EXCEDRIN MIGRAINE) 250-250-65 MG tablet Take 2 tablets by mouth every 6 (six) hours as needed for headache.    . calcitonin, salmon, (MIACALCIN/FORTICAL) 200 UNIT/ACT nasal spray Place 1 spray into alternate nostrils daily.    . furosemide (LASIX) 20 MG tablet Take 20-40 mg by mouth 2 (two) times daily as needed for fluid.   2  . gabapentin (NEURONTIN) 300 MG capsule Take 1 capsule (300 mg total) by mouth 3 (three) times daily. 90 capsule 3  . HYDROcodone-acetaminophen (NORCO) 10-325 MG tablet Take 1 tablet by mouth every 4 (four) hours as needed. Reported on 11/18/2015    . insulin aspart (NOVOLOG) 100 UNIT/ML injection Inject 2-5 Units into the skin daily as needed for high blood sugar. Per sliding scale.    Marland Kitchen ipratropium-albuterol (DUONEB)  0.5-2.5 (3) MG/3ML SOLN Take 3 mLs by nebulization 3 (three) times daily as needed (shorntess of breath/wheezing). 360 mL 0  . KLOR-CON 10 10 MEQ tablet Take 10 mEq by mouth as needed (Only takes with the furosemide).   2  . levothyroxine (SYNTHROID, LEVOTHROID) 50 MCG tablet Take 1 tablet (50 mcg total) by mouth  daily. 90 tablet 3  . metoprolol succinate (TOPROL-XL) 50 MG 24 hr tablet Take 1 tablet (50 mg total) by mouth daily. 30 tablet 6  . OVER THE COUNTER MEDICATION Place 1 drop into both eyes daily as needed (Dry Eyes).    Marland Kitchen PROAIR RESPICLICK 130 (90 BASE) MCG/ACT AEPB Inhale 2 puffs into the lungs every 4 (four) hours as needed (Shortness of breath).   3  . sertraline (ZOLOFT) 50 MG tablet Take 3 tablets (150 mg total) by mouth daily. 90 tablet 3  . [DISCONTINUED] prochlorperazine (COMPAZINE) 10 MG tablet Take 10 mg by mouth as needed. For nausea      No current facility-administered medications on file prior to visit.    Past Surgical History  Procedure Laterality Date  . Cholecystectomy  2008  . Bone marrow biopsy    . 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 Colonoscopy with esophagogastroduodenoscopy (egd) N/A 01/04/2013    Dr. Gala Romney: esophageal plaques with +KOH, hh. Gastric antrum abnormal, bx reactive gastropathy. Anal canal hemorrhoids, colonic tics, normal TI, single polyp (sessile serrated tubular adenoma). Next TCS 12/2017  . Cataract extraction      Denies any headaches, dizziness, double vision, fevers, chills, night sweats, nausea, vomiting, diarrhea, constipation, chest pain, heart palpitations, shortness of breath, blood in stool, black tarry stool, urinary pain, urinary burning, urinary frequency, hematuria.   PHYSICAL EXAMINATION  ECOG PERFORMANCE STATUS: 0 - Asymptomatic  Filed Vitals:   11/28/15 1340  BP: 133/73  Pulse: 88  Temp: 98 F (36.7 C)  Resp: 18    GENERAL:alert, no distress, comfortable, cooperative, obese, smiling and accompanied by her mother, daughter, and granddaughter. SKIN: skin color, texture, turgor are normal, no rashes or significant lesions HEAD: Normocephalic, No masses, lesions, tenderness or abnormalities EYES: normal, EOMI, Conjunctiva are pink and non-injected EARS: External ears normal OROPHARYNX:lips, buccal mucosa, and tongue normal and mucous  membranes are moist  NECK: supple, trachea midline, thick LYMPH:  no palpable lymphadenopathy BREAST:not examined LUNGS: clear to auscultation  HEART: regular rate & rhythm, no murmurs, no gallops, S1 normal and S2 normal ABDOMEN:abdomen soft, non-tender, obese and normal bowel sounds BACK: Back symmetric, no curvature., No CVA tenderness EXTREMITIES:less then 2 second capillary refill, no joint deformities, effusion, or inflammation, no skin discoloration, no cyanosis  NEURO: alert & oriented x 3 with fluent speech, no focal motor/sensory deficits, gait normal   LABORATORY DATA: CBC    Component Value Date/Time   WBC 9.9 11/28/2015 1330   WBC 7.0 02/02/2013 0840   RBC 5.33* 11/28/2015 1330   RBC 4.68 02/02/2013 0840   HGB 14.6 11/28/2015 1330   HGB 11.1* 02/02/2013 0840   HCT 46.9* 11/28/2015 1330   HCT 34.8 02/02/2013 0840   PLT 131* 11/28/2015 1330   PLT 336 02/02/2013 0840   MCV 88.0 11/28/2015 1330   MCV 74.3* 02/02/2013 0840   MCH 27.4 11/28/2015 1330   MCH 23.7* 02/02/2013 0840   MCHC 31.1 11/28/2015 1330   MCHC 31.9 02/02/2013 0840   RDW 15.4 11/28/2015 1330   RDW 21.1* 02/02/2013 0840   LYMPHSABS 2.8 11/28/2015 1330   LYMPHSABS 0.8* 02/02/2013 0840  MONOABS 0.9 11/28/2015 1330   MONOABS 0.3 02/02/2013 0840   EOSABS 0.4 11/28/2015 1330   EOSABS 0.0 02/02/2013 0840   BASOSABS 0.1 11/28/2015 1330   BASOSABS 0.0 02/02/2013 0840      Chemistry      Component Value Date/Time   NA 140 11/28/2015 1330   NA 140 02/02/2013 0840   K 4.4 11/28/2015 1330   K 3.9 02/02/2013 0840   CL 105 11/28/2015 1330   CL 97* 02/02/2013 0840   CO2 25 11/28/2015 1330   CO2 30* 02/02/2013 0840   BUN 29* 11/28/2015 1330   BUN 25.7 02/02/2013 0840   CREATININE 1.46* 11/28/2015 1330   CREATININE 1.27* 01/24/2015 1106   CREATININE 1.4* 02/02/2013 0840      Component Value Date/Time   CALCIUM 9.1 11/28/2015 1330   CALCIUM 8.7 02/02/2013 0840   ALKPHOS 71 11/28/2015 1330    ALKPHOS 60 02/02/2013 0840   AST 30 11/28/2015 1330   AST 23 02/02/2013 0840   ALT 27 11/28/2015 1330   ALT 67* 02/02/2013 0840   BILITOT 0.5 11/28/2015 1330   BILITOT 0.68 02/02/2013 0840        PENDING LABS:   RADIOGRAPHIC STUDIES:  Dg Chest 2 View  10/31/2015  CLINICAL DATA:  Nasal congestion, centralized chest pain starting yesterday. EXAM: CHEST  2 VIEW COMPARISON:  Feb 16, 2015 FINDINGS: The heart size and mediastinal contours are within normal limits. There is no focal infiltrate, pulmonary edema, or pleural effusion. The visualized skeletal structures are unremarkable. Prior cholecystectomy clips are identified. IMPRESSION: No active cardiopulmonary disease. Electronically Signed   By: Abelardo Diesel M.D.   On: 10/31/2015 14:16   Ct Head Wo Contrast  10/31/2015  CLINICAL DATA:  Headache for 1 week with nausea, vomiting and photophobia. EXAM: CT HEAD WITHOUT CONTRAST TECHNIQUE: Contiguous axial images were obtained from the base of the skull through the vertex without intravenous contrast. COMPARISON:  Head CT scan 04/05/2013.  Brain MRI 02/28/2013. FINDINGS: No acute intracranial abnormality including hemorrhage, infarct, mass lesion, mass effect, midline shift or abnormal extra-axial fluid collection is identified. Small focus of hypoattenuation in the deep white matter of the right frontal lobe is new since the prior study. There is no pneumocephalus or hydrocephalus. The calvarium is intact. Imaged paranasal sinuses and mastoid air cells are clear. IMPRESSION: No acute abnormality. Small focus of decreased attenuation in the deep white matter of the right frontal lobe is new since the prior examinations may be due to microvascular ischemic change. Electronically Signed   By: Inge Rise M.D.   On: 10/31/2015 14:15     PATHOLOGY:    ASSESSMENT AND PLAN:  ITP (idiopathic thrombocytopenic purpura) ITP diagnosed in 2011; having failed splenectomy but responsive to  corticosteroid treatment.  Currently on Dexamethasone 0.5 mg BID with patient refusing to taper and discontinue (and ascertaining corticosteroid Rxs from other healthcare providers).  Patient educated on the role of corticosteroids and ITP.  She is educated on her platelet count and the goal of maintaining a platelet count of 60,000 or greater is most reasonable.  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  This is being managed by her primary care provider. I agree that the patient's noncompliance with recommended treatment has been detrimental in the patient's health.  We have attempted on multiple occasions to discontinue her corticosteroids, but then she ascertains Rxs from other healthcare providers, including Delman Cheadle and  [name] (previous primary care providers) in 2016.  As a result, I have called her pharmacy and cancel any/all Dexamethasone refills she has available.  Hematology's recommendations are well documented and I recommend readers review those previous encounters.  I have not personally seen the patient since 2013.  In March 2016 and October 2016 it is documented what hematology's recommendations are with both documents demonstrating that Decadron is not in the patient's best interest and her failure to comply with recommended treatment.  AS A RESULT, I am changing her corticosteroid treatment as outlined below:  Decrease Dexamethasone to 0.25 mg daily x 7 days, then 0.25 mg QOD x 14 days, then STOP.  This will be described to her pharmacy. She will not be prescribed dexamethasone moving forward unless indicated.  She is also educated on newer agents for her ITP if needed that would be more appropriate in Rae's situation (ie N-PLATE, PROMACTA).    CBC every 2 weeks.  Return in 12 weeks for follow-up.  She denies any improvement in her pain and reports that  Gabapentin is not helping with pain relief.  She wants to know if I can change or add any other pain medications.  I will defer to her primary care provider as it is not related to her ITP.  Hematology will not participate in her pain control medications.  THERAPY PLAN:  I am tapering her Dexamethasone as outlined above.  Goal is to maintain a platelet count of 60,000 or greater.  If necessary, corticosteroid treatment can be re-instituted if necessary +/- IVIG with prescribing of a newer ITP treatment such as Promacta.  All questions were answered. The patient knows to call the clinic with any problems, questions or concerns. We can certainly see the patient much sooner if necessary.  Patient and plan discussed with Dr. Ancil Linsey and she is in agreement with the aforementioned.   This note is electronically signed by: Doy Mince 11/28/2015 5:49 PM

## 2015-11-28 NOTE — Telephone Encounter (Signed)
Rx sent in for synthroid, 1 pill daily to help with energy.

## 2015-11-28 NOTE — Assessment & Plan Note (Addendum)
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  Currently on Dexamethasone 0.5 mg BID with patient refusing to taper and discontinue (and ascertaining corticosteroid Rxs from other healthcare providers).  Patient educated on the role of corticosteroids and ITP.  She is educated on her platelet count and the goal of maintaining a platelet count of 60,000 or greater is most reasonable.  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  This is being managed by her primary care provider. I agree that the patient's noncompliance with recommended treatment has been detrimental in the patient's health.  We have attempted on multiple occasions to discontinue her corticosteroids, but then she ascertains Rxs from other healthcare providers, including Delman Cheadle and [name] (previous primary care providers) in 2016.  As a result, I have called her pharmacy and cancel any/all Dexamethasone refills she has available.  Hematology's recommendations are well documented and I recommend readers review those previous encounters.  I have not personally seen the patient since 2013.  In March 2016 and October 2016 it is documented what hematology's recommendations are with both documents demonstrating that Decadron is not in the patient's best interest and her failure to comply with recommended treatment.  AS A RESULT, I am changing her corticosteroid treatment as outlined below:  Decrease Dexamethasone to 0.25 mg daily x 7 days, then 0.25 mg QOD x 14 days, then STOP.  This will be described to her pharmacy. She will not be prescribed dexamethasone moving forward unless indicated.  She is also educated on newer agents for her ITP if needed that would be more appropriate in [place]'s situation (ie N-PLATE, PROMACTA).    CBC every 2 weeks.  Return in 12 weeks for  follow-up.  She denies any improvement in her pain and reports that Gabapentin is not helping with pain relief.  She wants to know if I can change or add any other pain medications.  I will defer to her primary care provider as it is not related to her ITP.  Hematology will not participate in her pain control medications.

## 2015-11-28 NOTE — Patient Instructions (Addendum)
Summer Cook at [facility] Discharge Instructions  RECOMMENDATIONS MADE BY THE CONSULTANT AND ANY TEST RESULTS WILL BE SENT TO YOUR REFERRING PHYSICIAN.  Exam and discussion today with Kirby Crigler, PA. Decadron taper (prescription sent to your pharmacy):  0.25 mg x 1 week                                                                          0.25 mg every other day x 2 weeks then STOP  Lab work in 2 weeks. Office visit in 12 weeks with Dr. Whitney Muse.   Thank you for choosing [place] at [facility] to provide your oncology and hematology care.  To afford each patient quality time with our provider, please arrive at least 15 minutes before your scheduled appointment time.   Beginning January 23rd 2017 lab work for the [facility] will be done in the  Main lab at [facility] on 1st floor. If you have a lab appointment with the [facility] please come in thru the  Main Entrance and check in at the main information desk  You need to re-schedule your appointment should you arrive 10 or more minutes late.  We strive to give you quality time with our providers, and arriving late affects you and other patients whose appointments are after yours.  Also, if you no show three or more times for appointments you may be dismissed from the clinic at the providers discretion.     Again, thank you for choosing [facility].  Our hope is that these requests will decrease the amount of time that you wait before being seen by our physicians.       _____________________________________________________________  Should you have questions after your visit to [facility], please contact our office at [phone] between the hours of 8:30 a.m. and 4:30 p.m.  Voicemails left after 4:30 p.m. will not be returned until the following business day.  For prescription refill requests, have your pharmacy contact our office.         Resources  For Cancer Patients and their Caregivers ? American Cancer Society: Can assist with transportation, wigs, general needs, runs Look Good Feel Better.        304-517-5794 ? Cancer Care: Provides financial assistance, online support groups, medication/co-pay assistance.  1-800-813-HOPE 310 549 7768) ? [facility] Assists [place] cancer patients and their families through emotional , educational and financial support.  302-546-8196 ? [place] DSS Where to apply for food stamps, Medicaid and utility assistance. 3467374475 ? [facility]: Transportation to medical appointments. 646-485-3025 ? Social Security Administration: May apply for disability if have a Stage IV cancer. 289-495-4350 802-010-6747 ? [employer], Disability and Transit Services: Assists with nutrition, care and transit needs. ([phone] 871-9343

## 2015-11-29 NOTE — Telephone Encounter (Signed)
Per previous phone note on same topic we are not able to adjust outside of visit.

## 2015-11-29 NOTE — Telephone Encounter (Signed)
Please advise 

## 2015-12-01 ENCOUNTER — Encounter: Payer: Self-pay | Admitting: Internal Medicine

## 2015-12-03 ENCOUNTER — Encounter: Payer: Self-pay | Admitting: Gastroenterology

## 2015-12-09 ENCOUNTER — Ambulatory Visit (HOSPITAL_COMMUNITY): Payer: Medicare Other

## 2015-12-10 ENCOUNTER — Encounter: Payer: Self-pay | Admitting: Internal Medicine

## 2015-12-12 ENCOUNTER — Encounter (HOSPITAL_COMMUNITY): Payer: Medicare HMO

## 2015-12-12 DIAGNOSIS — D693 Immune thrombocytopenic purpura: Secondary | ICD-10-CM | POA: Diagnosis not present

## 2015-12-12 LAB — CBC
HCT: 48.6 % — ABNORMAL HIGH (ref 36.0–46.0)
Hemoglobin: 15.5 g/dL — ABNORMAL HIGH (ref 12.0–15.0)
MCH: 27.5 pg (ref 26.0–34.0)
MCHC: 31.9 g/dL (ref 30.0–36.0)
MCV: 86.3 fL (ref 78.0–100.0)
Platelets: 119 10*3/uL — ABNORMAL LOW (ref 150–400)
RBC: 5.63 MIL/uL — AB (ref 3.87–5.11)
RDW: 15.3 % (ref 11.5–15.5)
WBC: 7.9 10*3/uL (ref 4.0–10.5)

## 2015-12-16 ENCOUNTER — Emergency Department (HOSPITAL_COMMUNITY): Payer: Medicare HMO

## 2015-12-16 ENCOUNTER — Encounter (HOSPITAL_COMMUNITY): Payer: Self-pay | Admitting: Emergency Medicine

## 2015-12-16 ENCOUNTER — Observation Stay (HOSPITAL_COMMUNITY)
Admission: EM | Admit: 2015-12-16 | Discharge: 2015-12-17 | Discharge status: Against Medical Advice | Payer: Medicare HMO | Attending: Internal Medicine | Admitting: Internal Medicine

## 2015-12-16 DIAGNOSIS — R4781 Slurred speech: Secondary | ICD-10-CM | POA: Diagnosis not present

## 2015-12-16 DIAGNOSIS — Z86711 Personal history of pulmonary embolism: Secondary | ICD-10-CM | POA: Diagnosis not present

## 2015-12-16 DIAGNOSIS — R42 Dizziness and giddiness: Secondary | ICD-10-CM | POA: Diagnosis not present

## 2015-12-16 DIAGNOSIS — N183 Chronic kidney disease, stage 3 unspecified: Secondary | ICD-10-CM | POA: Diagnosis present

## 2015-12-16 DIAGNOSIS — D693 Immune thrombocytopenic purpura: Secondary | ICD-10-CM | POA: Diagnosis not present

## 2015-12-16 DIAGNOSIS — I639 Cerebral infarction, unspecified: Secondary | ICD-10-CM | POA: Diagnosis present

## 2015-12-16 DIAGNOSIS — R531 Weakness: Secondary | ICD-10-CM | POA: Diagnosis not present

## 2015-12-16 DIAGNOSIS — Z87891 Personal history of nicotine dependence: Secondary | ICD-10-CM | POA: Diagnosis not present

## 2015-12-16 DIAGNOSIS — R200 Anesthesia of skin: Secondary | ICD-10-CM | POA: Diagnosis not present

## 2015-12-16 DIAGNOSIS — I6310 Cerebral infarction due to embolism of unspecified precerebral artery: Secondary | ICD-10-CM | POA: Diagnosis not present

## 2015-12-16 DIAGNOSIS — I10 Essential (primary) hypertension: Secondary | ICD-10-CM | POA: Diagnosis not present

## 2015-12-16 DIAGNOSIS — I129 Hypertensive chronic kidney disease with stage 1 through stage 4 chronic kidney disease, or unspecified chronic kidney disease: Secondary | ICD-10-CM | POA: Diagnosis not present

## 2015-12-16 DIAGNOSIS — Z9081 Acquired absence of spleen: Secondary | ICD-10-CM | POA: Insufficient documentation

## 2015-12-16 LAB — I-STAT CHEM 8, ED
BUN: 35 mg/dL — AB (ref 6–20)
CALCIUM ION: 1.22 mmol/L (ref 1.12–1.23)
CHLORIDE: 104 mmol/L (ref 101–111)
CREATININE: 1.9 mg/dL — AB (ref 0.44–1.00)
Glucose, Bld: 92 mg/dL (ref 65–99)
HCT: 51 % — ABNORMAL HIGH (ref 36.0–46.0)
Hemoglobin: 17.3 g/dL — ABNORMAL HIGH (ref 12.0–15.0)
Potassium: 3.6 mmol/L (ref 3.5–5.1)
SODIUM: 138 mmol/L (ref 135–145)
TCO2: 21 mmol/L (ref 0–100)

## 2015-12-16 LAB — CBC WITH DIFFERENTIAL/PLATELET
Basophils Absolute: 0.1 10*3/uL (ref 0.0–0.1)
Basophils Relative: 2 %
Eosinophils Absolute: 0.4 10*3/uL (ref 0.0–0.7)
Eosinophils Relative: 4 %
HEMATOCRIT: 49 % — AB (ref 36.0–46.0)
HEMOGLOBIN: 16.1 g/dL — AB (ref 12.0–15.0)
LYMPHS ABS: 2.8 10*3/uL (ref 0.7–4.0)
Lymphocytes Relative: 32 %
MCH: 27.7 pg (ref 26.0–34.0)
MCHC: 32.9 g/dL (ref 30.0–36.0)
MCV: 84.3 fL (ref 78.0–100.0)
MONOS PCT: 13 %
Monocytes Absolute: 1.1 10*3/uL — ABNORMAL HIGH (ref 0.1–1.0)
Neutro Abs: 4.3 10*3/uL (ref 1.7–7.7)
Neutrophils Relative %: 50 %
Platelets: 105 10*3/uL — ABNORMAL LOW (ref 150–400)
RBC: 5.81 MIL/uL — ABNORMAL HIGH (ref 3.87–5.11)
RDW: 15.2 % (ref 11.5–15.5)
WBC: 8.7 10*3/uL (ref 4.0–10.5)

## 2015-12-16 LAB — COMPREHENSIVE METABOLIC PANEL
ALK PHOS: 65 U/L (ref 38–126)
ALT: 22 U/L (ref 14–54)
AST: 38 U/L (ref 15–41)
Albumin: 3.7 g/dL (ref 3.5–5.0)
Anion gap: 10 (ref 5–15)
BILIRUBIN TOTAL: 0.6 mg/dL (ref 0.3–1.2)
BUN: 34 mg/dL — AB (ref 6–20)
CALCIUM: 10.2 mg/dL (ref 8.9–10.3)
CO2: 23 mmol/L (ref 22–32)
CREATININE: 2.07 mg/dL — AB (ref 0.44–1.00)
Chloride: 106 mmol/L (ref 101–111)
GFR calc Af Amer: 31 mL/min — ABNORMAL LOW (ref >60)
GFR, EST NON AFRICAN AMERICAN: 27 mL/min — AB (ref >60)
Glucose, Bld: 108 mg/dL — ABNORMAL HIGH (ref 65–99)
Potassium: 4.5 mmol/L (ref 3.5–5.1)
Sodium: 139 mmol/L (ref 135–145)
TOTAL PROTEIN: 7.3 g/dL (ref 6.5–8.1)

## 2015-12-16 LAB — ETHANOL: Alcohol, Ethyl (B): <5 mg/dL (ref <5)

## 2015-12-16 LAB — I-STAT TROPONIN, ED: TROPONIN I, POC: 0 ng/mL (ref 0.00–0.08)

## 2015-12-16 LAB — PROTIME-INR
INR: 1.01 (ref 0.00–1.49)
PROTHROMBIN TIME: 13.5 s (ref 11.6–15.2)

## 2015-12-16 LAB — APTT: aPTT: 24 seconds (ref 24–37)

## 2015-12-16 LAB — TROPONIN I: Troponin I: <0.03 ng/mL (ref <0.031)

## 2015-12-16 MED ORDER — HYDROCODONE-ACETAMINOPHEN 5-325 MG PO TABS
1.0000 | ORAL_TABLET | Freq: Once | ORAL | Status: AC
  Administered 2015-12-16: 1 via ORAL
  Filled 2015-12-16: qty 1

## 2015-12-16 MED ORDER — DEXAMETHASONE 0.5 MG PO TABS
0.2500 mg | ORAL_TABLET | ORAL | Status: DC
  Filled 2015-12-16: qty 0.5

## 2015-12-16 MED ORDER — SERTRALINE HCL 50 MG PO TABS
150.0000 mg | ORAL_TABLET | Freq: Every day | ORAL | Status: DC
  Administered 2015-12-17: 150 mg via ORAL
  Filled 2015-12-16: qty 3

## 2015-12-16 MED ORDER — STROKE: EARLY STAGES OF RECOVERY BOOK
Freq: Once | Status: DC
  Filled 2015-12-16: qty 1

## 2015-12-16 MED ORDER — IPRATROPIUM-ALBUTEROL 0.5-2.5 (3) MG/3ML IN SOLN: 3.0000 mL | Freq: Three times a day (TID) | RESPIRATORY_TRACT | Status: DC | PRN

## 2015-12-16 MED ORDER — ALPRAZOLAM 0.5 MG PO TABS: 1.0000 mg | ORAL_TABLET | Freq: Three times a day (TID) | ORAL | Status: DC | PRN

## 2015-12-16 MED ORDER — SODIUM CHLORIDE 0.9 % IV SOLN: INTRAVENOUS | Status: DC

## 2015-12-16 MED ORDER — LEVOTHYROXINE SODIUM 25 MCG PO TABS
50.0000 ug | ORAL_TABLET | Freq: Every day | ORAL | Status: DC
  Administered 2015-12-17: 50 ug via ORAL
  Filled 2015-12-16: qty 2

## 2015-12-16 NOTE — ED Notes (Signed)
To MRI via stretcher  

## 2015-12-16 NOTE — H&P (Signed)
PCP:   Hoyt Koch, MD   Chief Complaint:  Right arm weakness  HPI:  50 year old female who  has a past medical history of Essential hypertension, benign; ITP (idiopathic thrombocytopenic purpura) (08/2010); Depression; Thrush, oral (05/29/2011); Steroid-induced diabetes ([place]); Pulmonary embolism ([place]) (04/2012); Asthma; Renal insufficiency; Chronic chest pain; Normal cardiac stress test (02/2014); Gout; Arthritis; GERD (gastroesophageal reflux disease); and Allergy. Today presents to the hospital with right arm weakness for past 2 days. Patient had trouble holding last in the right hand and also had trouble writing using the right hand. Patient had some dizziness and also noticed slow speech. Patient had chest pain 2 days ago which she thought was due to acid reflux as patient also had nausea along with that. Denies  shortness of breath. No vomiting or diarrhea. CT head in the ED showed no significant abnormality. MRI brain showed multifocal areas of nonhemorrhagic acute infarction dominantly affecting the left posterior frontal cortex and subcortical white matter. Also showed chronic 7 x 12 mm focus of chronic hemorrhage in the left cerebellar tonsil which was also present in MRI from 2014.  Patient has a history of ITP, so cannot take aspirin. She has been on tapering dose of Decadron as she is currently in remission ITP. Patient to stop Decadron in next two days. Currently on Decadron 0.25 mg every OD.  Allergies:   Allergies  Allergen Reactions  . Brintellix [Vortioxetine] Itching  . Yellow Jacket Venom Swelling  . Adhesive [Tape] Rash    Please use paper tape      Past Medical History  Diagnosis Date  . Essential hypertension, benign   . 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  Last was Prednisone '40mg'$ ; 2 wk in 10/2012.  She also was on Prednisone bridged with  Cellcept briefly but stopped due to lack of insurance.   . Depression   . Thrush, oral 05/29/2011  . Steroid-induced diabetes ([place])   . Pulmonary embolism ([place]) 04/2012  . Asthma   . Renal insufficiency   . Chronic chest pain   . Normal cardiac stress test 02/2014  . Gout   . Arthritis   . GERD (gastroesophageal reflux disease)   . Allergy     Past Surgical History  Procedure Laterality Date  . Cholecystectomy  2008  . Bone marrow biopsy    . 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 Colonoscopy with esophagogastroduodenoscopy (egd) N/A 01/04/2013    Dr. Gala Romney: esophageal plaques with +KOH, hh. Gastric antrum abnormal, bx reactive gastropathy. Anal canal hemorrhoids, colonic tics, normal TI, single polyp (sessile serrated tubular adenoma). Next TCS 12/2017  . Cataract extraction      Prior to Admission medications   Medication Sig Start Date End Date Taking? Authorizing Provider  calcitonin, salmon, (MIACALCIN/FORTICAL) 200 UNIT/ACT nasal spray Place 1 spray into alternate nostrils daily.   Yes Historical Provider, MD  dexamethasone (DECADRON) 0.5 MG tablet Take 0.25 mg daily x 1 week and then 0.25 mg QOD x 2 weeks, then stop. 11/28/15 12/19/15 Yes Manon Hilding Kefalas, PA-C  levothyroxine (SYNTHROID, LEVOTHROID) 50 MCG tablet Take 1 tablet (50 mcg total) by mouth daily. 11/28/15  Yes Hoyt Koch, MD  metoprolol succinate (TOPROL-XL) 50 MG 24 hr tablet Take 1 tablet (50 mg total) by mouth daily. 01/25/15  Yes Satira Sark, MD  sertraline (ZOLOFT) 50 MG tablet Take 3 tablets (150 mg total) by mouth daily. 11/20/15  Yes Hoyt Koch, MD  ALPRAZolam Duanne Moron) 1 MG tablet Take 1 mg by mouth 3 (three) times daily as needed for anxiety. Reported on 11/18/2015    Historical Provider, MD  aspirin-acetaminophen-caffeine (EXCEDRIN MIGRAINE) [phone] MG tablet Take 2 tablets by mouth every 6 (six) hours as needed for headache.    Historical Provider, MD  furosemide (LASIX) 20 MG tablet Take 20-40 mg  by mouth 2 (two) times daily as needed for fluid.  12/16/14   Historical Provider, MD  gabapentin (NEURONTIN) 300 MG capsule Take 1 capsule (300 mg total) by mouth 3 (three) times daily. Patient not taking: Reported on 12/16/2015 11/21/15   Hoyt Koch, MD  ipratropium-albuterol (DUONEB) 0.5-2.5 (3) MG/3ML SOLN Take 3 mLs by nebulization 3 (three) times daily as needed (shorntess of breath/wheezing). 02/18/15   Kathie Dike, MD  KLOR-CON 10 10 MEQ tablet Take 10 mEq by mouth as needed (Only takes with the furosemide).  11/13/14   Historical Provider, MD  OVER THE COUNTER MEDICATION Place 1 drop into both eyes daily as needed (Dry Eyes).    Historical Provider, MD  PROAIR RESPICLICK 244 (90 BASE) MCG/ACT AEPB Inhale 2 puffs into the lungs every 4 (four) hours as needed (Shortness of breath).  12/27/14   Historical Provider, MD    Social History:  reports that she quit smoking about 6 years ago. Her smoking use included Cigarettes. She quit after 0 years of use. She has never used smokeless tobacco. She reports that she does not drink alcohol or use illicit drugs.  Family History  Problem Relation Age of Onset  . Cervical cancer Mother   . Lung cancer Father   . Pneumonia Brother   . Cancer Paternal Grandmother 20    colon cancer, breast cancer  . Cancer Paternal Grandfather 23    colon cancer  . AAA (abdominal aortic aneurysm) Paternal [place] [employer]   12/16/15 1228  Weight: 108.863 kg (240 lb)    All the positives are listed in BOLD  Review of Systems:  HEENT: Headache, blurred vision, runny nose, sore throat, dizziness Neck: Hypothyroidism, hyperthyroidism,,lymphadenopathy Chest : Shortness of breath, history of COPD, Asthma Heart : Chest pain, history of coronary arterey disease GI:  Nausea, vomiting, diarrhea, constipation, GERD GU: Dysuria, urgency, frequency of urination, hematuria Neuro: Stroke, seizures, syncope Psych: Depression, anxiety,  hallucinations   Physical Exam: Blood pressure 110/96, pulse 75, temperature 98 F (36.7 C), temperature source Oral, resp. rate 15, height '5\' 5"'$  (1.651 m), weight 108.863 kg (240 lb), last menstrual period 05/13/2011, SpO2 91 %. Constitutional:   Patient is a well-developed and well-nourished female* in no acute distress and cooperative with exam. Head: Normocephalic and atraumatic Mouth: Mucus membranes moist Eyes: PERRL, EOMI, conjunctivae normal Neck: Supple, No Thyromegaly Cardiovascular: RRR, S1 normal, S2 normal Pulmonary/Chest: CTAB, no wheezes, rales, or rhonchi Abdominal: Soft. Non-tender, non-distended, bowel sounds are normal, no masses, organomegaly, or guarding present.  Neurological: A&O x3, strength 4/5 in right upper extremity, 5/5 in all other extremities, cranial nerve II-XII are grossly intact, no focal motor deficit, sensory intact to light touch bilaterally.  Extremities : No Cyanosis, Clubbing or Edema  Labs on Admission:  Basic Metabolic Panel:  Recent Labs Lab 12/16/15 1351 12/16/15 1539  NA 139 138  K 4.5 3.6  CL 106 104  CO2 23  --   GLUCOSE 108* 92  BUN 34* 35*  CREATININE 2.07* 1.90*  CALCIUM 10.2  --  Liver Function Tests:  Recent Labs Lab 12/16/15 1351  AST 38  ALT 22  ALKPHOS 65  BILITOT 0.6  PROT 7.3  ALBUMIN 3.7    CBC:  Recent Labs Lab 12/12/15 1326 12/16/15 1351 12/16/15 1539  WBC 7.9 8.7  --   NEUTROABS  --  4.3  --   HGB 15.5* 16.1* 17.3*  HCT 48.6* 49.0* 51.0*  MCV 86.3 84.3  --   PLT 119* 105*  --    Cardiac Enzymes:  Recent Labs Lab 12/16/15 1351  TROPONINI <0.03     Radiological Exams on Admission: Dg Chest 2 View  12/16/2015  CLINICAL DATA:  Weakness EXAM: CHEST  2 VIEW COMPARISON:  10/31/2015 FINDINGS: Mild hyperinflation. Heart size mildly enlarged. Negative for heart failure. No infiltrate effusion or mass. Mild compression fractures at 2 levels in the lower thoracic spine unchanged from the prior  study. IMPRESSION: No active cardiopulmonary disease. Electronically Signed   By: Marlan Palau M.D.   On: 12/16/2015 13:10   Ct Head Wo Contrast  12/16/2015  CLINICAL DATA:  Difficulty using the right hand beginning 3 days ago. Dizziness. No known injury. Initial encounter. EXAM: CT HEAD WITHOUT CONTRAST CT CERVICAL SPINE WITHOUT CONTRAST TECHNIQUE: Multidetector CT imaging of the head and cervical spine was performed following the standard protocol without intravenous contrast. Multiplanar CT image reconstructions of the cervical spine were also generated. COMPARISON:  Head CT scan 04/05/2013. FINDINGS: CT HEAD FINDINGS The brain appears normal without hemorrhage, infarct, mass lesion, mass effect, midline shift or abnormal extra-axial fluid collection. No hydrocephalus or pneumocephalus. The calvarium is intact. Imaged paranasal sinuses are clear. CT CERVICAL SPINE FINDINGS There is no fracture or malalignment cervical spine. Mild loss of disc space height and endplate spurring are seen at C5-6 and C6-7. Facet joints are unremarkable. Lung apices are clear. IMPRESSION: No acute abnormality head or cervical spine. Mild appearing degenerative disease C5-6 and C6-7. Electronically Signed   By: Drusilla Kanner M.D.   On: 12/16/2015 16:24   Ct Cervical Spine Wo Contrast  12/16/2015  CLINICAL DATA:  Difficulty using the right hand beginning 3 days ago. Dizziness. No known injury. Initial encounter. EXAM: CT HEAD WITHOUT CONTRAST CT CERVICAL SPINE WITHOUT CONTRAST TECHNIQUE: Multidetector CT imaging of the head and cervical spine was performed following the standard protocol without intravenous contrast. Multiplanar CT image reconstructions of the cervical spine were also generated. COMPARISON:  Head CT scan 04/05/2013. FINDINGS: CT HEAD FINDINGS The brain appears normal without hemorrhage, infarct, mass lesion, mass effect, midline shift or abnormal extra-axial fluid collection. No hydrocephalus or  pneumocephalus. The calvarium is intact. Imaged paranasal sinuses are clear. CT CERVICAL SPINE FINDINGS There is no fracture or malalignment cervical spine. Mild loss of disc space height and endplate spurring are seen at C5-6 and C6-7. Facet joints are unremarkable. Lung apices are clear. IMPRESSION: No acute abnormality head or cervical spine. Mild appearing degenerative disease C5-6 and C6-7. Electronically Signed   By: Drusilla Kanner M.D.   On: 12/16/2015 16:24   Mr Angiogram Head Wo Contrast  12/16/2015  CLINICAL DATA:  Right-sided weakness which began Saturday. Stroke risk factors include hypertension, and diabetes. LEFT facial and arm numbness. EXAM: MRI HEAD WITHOUT CONTRAST MRA HEAD WITHOUT CONTRAST TECHNIQUE: Multiplanar, multiecho pulse sequences of the brain and surrounding structures were obtained without intravenous contrast. Angiographic images of the head were obtained using MRA technique without contrast. COMPARISON:  CT head earlier today. FINDINGS: MRI HEAD FINDINGS Multifocal areas of restricted  diffusion affect the LEFT posterior frontal cortex and subcortical white matter system consistent with acute infarction. There is no associated hemorrhage, or midline shift. The largest confluent area of acute infarction measures 11 x 15 mm in the periventricular white matter. In the subcortical white matter of the RIGHT frontal lobe, image 45 series 101, there is a punctate focus of increased signal on coronal diffusion imaging, with intermediate ADC value, associated with T2 and FLAIR hyperintensity of the cortex and subcortical white matter in this region, favored to represent an area of subacute infarction which is normalizing on diffusion imaging. Normal for age cerebral volume. Minor white matter disease, likely chronic microvascular ischemic change. No midline abnormalities. In the LEFT cerebellar tonsil there is an area of susceptibility, seen best on gradient sequence but also visible on T1,  FLAIR, and T2 images, estimated 7 x 12 mm cross-section, which is of indeterminate etiology, but likely chronic. A cavernoma could have this appearance. Bold trauma or hemorrhagic infarct could appear in a similar fashion. Flow voids are maintained throughout, including the LEFT vertebral artery. Extracranial soft tissues unremarkable. BILATERAL cataract extraction. MRA HEAD FINDINGS Internal carotid arteries are widely patent. Basilar artery is widely patent with vertebrals codominant. No intracranial stenosis or aneurysm. No proximal LEFT MCA stenosis or occlusion. IMPRESSION: Multifocal areas of nonhemorrhagic acute infarction, predominantly affecting the LEFT posterior frontal cortex and subcortical white matter. Suspected subacute RIGHT frontal cortex and subcortical white matter infarction, also without hemorrhage. 7 x 12 mm focus of chronic hemorrhage in LEFT cerebellar tonsil, which could represent a remote hemorrhagic insult or a incidental cavernoma. Negative MRA.  No proximal LEFT ICA or LEFT MCA lesion is observed. Electronically Signed   By: Staci Righter M.D.   On: 12/16/2015 20:14   Mr Brain Wo Contrast  12/16/2015  CLINICAL DATA:  Right-sided weakness which began Saturday. Stroke risk factors include hypertension, and diabetes. LEFT facial and arm numbness. EXAM: MRI HEAD WITHOUT CONTRAST MRA HEAD WITHOUT CONTRAST TECHNIQUE: Multiplanar, multiecho pulse sequences of the brain and surrounding structures were obtained without intravenous contrast. Angiographic images of the head were obtained using MRA technique without contrast. COMPARISON:  CT head earlier today. FINDINGS: MRI HEAD FINDINGS Multifocal areas of restricted diffusion affect the LEFT posterior frontal cortex and subcortical white matter system consistent with acute infarction. There is no associated hemorrhage, or midline shift. The largest confluent area of acute infarction measures 11 x 15 mm in the periventricular white matter. In  the subcortical white matter of the RIGHT frontal lobe, image 45 series 101, there is a punctate focus of increased signal on coronal diffusion imaging, with intermediate ADC value, associated with T2 and FLAIR hyperintensity of the cortex and subcortical white matter in this region, favored to represent an area of subacute infarction which is normalizing on diffusion imaging. Normal for age cerebral volume. Minor white matter disease, likely chronic microvascular ischemic change. No midline abnormalities. In the LEFT cerebellar tonsil there is an area of susceptibility, seen best on gradient sequence but also visible on T1, FLAIR, and T2 images, estimated 7 x 12 mm cross-section, which is of indeterminate etiology, but likely chronic. A cavernoma could have this appearance. Bold trauma or hemorrhagic infarct could appear in a similar fashion. Flow voids are maintained throughout, including the LEFT vertebral artery. Extracranial soft tissues unremarkable. BILATERAL cataract extraction. MRA HEAD FINDINGS Internal carotid arteries are widely patent. Basilar artery is widely patent with vertebrals codominant. No intracranial stenosis or aneurysm. No proximal LEFT MCA  stenosis or occlusion. IMPRESSION: Multifocal areas of nonhemorrhagic acute infarction, predominantly affecting the LEFT posterior frontal cortex and subcortical white matter. Suspected subacute RIGHT frontal cortex and subcortical white matter infarction, also without hemorrhage. 7 x 12 mm focus of chronic hemorrhage in LEFT cerebellar tonsil, which could represent a remote hemorrhagic insult or a incidental cavernoma. Negative MRA.  No proximal LEFT ICA or LEFT MCA lesion is observed. Electronically Signed   By: Staci Righter M.D.   On: 12/16/2015 20:14   Mr Cervical Spine Wo Contrast  12/16/2015  CLINICAL DATA:  Left-sided facial and arm numbness. Altered mental status. EXAM: MRI CERVICAL SPINE WITHOUT CONTRAST TECHNIQUE: Multiplanar, multisequence  MR imaging of the cervical spine was performed. No intravenous contrast was administered. COMPARISON:  CT of the cervical spine earlier today. FINDINGS: The patient was unable to remain motionless for the exam. Small or subtle lesions could be overlooked. Alignment: Anatomic. Vertebrae: No worrisome osseous lesion. Cord: Slight cord flattening at C5-6 and C6-7 without abnormal cord signal. Posterior Fossa: No tonsillar herniation. Vertebral Arteries: Patent. Paraspinal tissues: No masses. Disc levels: The individual disc spaces were examined as follows: C2-3:  Shallow central protrusion.  No impingement. C3-4:  Normal disc space.  Asymmetric facet arthropathy on the LEFT. C4-5:  Mild bulge.  No impingement. C5-6: Central and rightward protrusion. Disc osteophyte complex. Mild right-sided cord flattening and foraminal narrowing. Disc space narrowing. C6-7: Central and leftward protrusion. Disc osteophyte complex. Mild left-sided cord flattening and foraminal narrowing. Disc space narrowing. C7-T1:  Unremarkable. IMPRESSION: Central and rightward protrusion at C5-6, as well as central and leftward protrusion at C6-7 along with disc osteophyte complex and disc space narrowing contribute to cord compression and foraminal narrowing as described above. Correlate clinically for LEFT C7 radiculopathy with regard to LEFT arm numbness. No acute features are observed. Electronically Signed   By: Staci Righter M.D.   On: 12/16/2015 20:00    EKG: Independently reviewed. Normal sinus rhythm, baseline wander   Assessment/Plan Active Problems:   ITP (idiopathic thrombocytopenic purpura)   Post-splenectomy   CKD (chronic kidney disease) stage 3, GFR 30-59 ml/min   Stroke [facility])   Stroke We'll admit the patient in telemetry. Will obtain carotid Doppler, echo can't examine a.m. Check hemoglobin A1c, lipid profile. I called and discussed with neurologist Dr. Nicole Kindred, as patient has a history of ITP will not initiate  aspirin at this time. Platelet count 105,000.  C KD stage III Patient space and creatinine normal 1.5, today her creatinine is 1.90. Will follow BMP in a.m. Patient takes Lasix which is currently on hold.  History of ITP Currently in remission, platelet count of 105,000 Patient is on tapering dose of Decadron, continue 0.25 mg Decadron every OD    Code status: Full code  Family discussion: No family present at bedside   Time Spent on Admission: 60 minutes  [facility] [facility] Pager: [phone] 12/16/2015, 8:53 PM  If 7PM-7AM, please contact night-coverage  [email or web address]  Password [email or web address]

## 2015-12-16 NOTE — ED Notes (Signed)
Patient eating at this time. Told patient to ring call bell once finished. Patient will be held in ED, patient will be placed on monitor.

## 2015-12-16 NOTE — ED Notes (Signed)
Patient requesting something for pain. Dr. Melanee Left made aware. New verbal orders given.

## 2015-12-16 NOTE — ED Notes (Signed)
Pt asking for food tray; food tray heated up and given to pt with a diet coke

## 2015-12-16 NOTE — ED Notes (Signed)
MD at bedside. 

## 2015-12-16 NOTE — ED Provider Notes (Signed)
CSN: [id number]     Arrival date & time 12/16/15  1216 History   First MD Initiated Contact with Patient 12/16/15 1457     Chief Complaint  Patient presents with  . Extremity Weakness     (Consider location/radiation/quality/duration/timing/severity/associated sxs/prior Treatment) HPI Comments: Patient with R arm and hand weakness and difficulty with coordination for the past 2 days.  LSN 2 nights ago. States had trouble holding a glass in her R hand and has since had trouble with writing and using her right hand.  Associated with some dizziness and slow speech. Denies any difficulty breathing or swallowing. No leg weakness. No trouble swallowing.  Had a few fleeting episodes of chest pain last week lasting for <1 minute at a time that she attributes to gas.  No pain now.  No head or neck pain. No vision changes.  Patient is a 50 y.o. female presenting with extremity weakness. The history is provided by the patient.  Extremity Weakness Pertinent negatives include no chest pain, no abdominal pain and no shortness of breath.    Past Medical History  Diagnosis Date  . Essential hypertension, benign   . 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  Last was Prednisone '40mg'$ ; 2 wk in 10/2012.  She also was on Prednisone bridged with Cellcept briefly but stopped due to lack of insurance.   . Depression   . Thrush, oral 05/29/2011  . Steroid-induced diabetes ([place])   . Pulmonary embolism ([place]) 04/2012  . Asthma   . Renal insufficiency   . Chronic chest pain   . Normal cardiac stress test 02/2014  . Gout   . Arthritis   . GERD (gastroesophageal reflux disease)   . Allergy    Past Surgical History  Procedure Laterality Date  . Cholecystectomy  2008  . Bone marrow biopsy    . 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 Colonoscopy with esophagogastroduodenoscopy (egd) N/A 01/04/2013    Dr. Gala Romney:  esophageal plaques with +KOH, hh. Gastric antrum abnormal, bx reactive gastropathy. Anal canal hemorrhoids, colonic tics, normal TI, single polyp (sessile serrated tubular adenoma). Next TCS 12/2017  . Cataract extraction     Family History  Problem Relation Age of Onset  . Cervical cancer Mother   . Lung cancer Father   . Pneumonia Brother   . Cancer Paternal Grandmother 40    colon cancer, breast cancer  . Cancer Paternal Grandfather 64    colon cancer  . AAA (abdominal aortic aneurysm) Paternal Grandmother    Social History  Substance Use Topics  . Smoking status: Former Smoker -- 0 years    Types: Cigarettes    Quit date: 02/14/2009  . Smokeless tobacco: Never Used  . Alcohol Use: No   OB History    No data available     Review of Systems  Constitutional: Negative for fever and activity change.  HENT: Negative for congestion and postnasal drip.   Eyes: Negative for visual disturbance.  Respiratory: Negative for cough, chest tightness and shortness of breath.   Cardiovascular: Negative for chest pain.  Gastrointestinal: Negative for nausea, vomiting and abdominal pain.  Genitourinary: Negative for dysuria, hematuria, vaginal bleeding and vaginal discharge.  Musculoskeletal: Positive for extremity weakness. Negative for myalgias.  Skin: Negative for rash.  Neurological: Positive for dizziness, speech difficulty, weakness, light-headedness and numbness.  A complete 10 system review of systems was obtained and all systems are negative except  as noted in the HPI and PMH.      Allergies  Brintellix; Yellow jacket venom; and Adhesive  Home Medications   Prior to Admission medications   Medication Sig Start Date End Date Taking? Authorizing Provider  calcitonin, salmon, (MIACALCIN/FORTICAL) 200 UNIT/ACT nasal spray Place 1 spray into alternate nostrils daily.   Yes Historical Provider, MD  dexamethasone (DECADRON) 0.5 MG tablet Take 0.25 mg daily x 1 week and then 0.25 mg  QOD x 2 weeks, then stop. 11/28/15 12/19/15 Yes Manon Hilding Kefalas, PA-C  levothyroxine (SYNTHROID, LEVOTHROID) 50 MCG tablet Take 1 tablet (50 mcg total) by mouth daily. 11/28/15  Yes Hoyt Koch, MD  metoprolol succinate (TOPROL-XL) 50 MG 24 hr tablet Take 1 tablet (50 mg total) by mouth daily. 01/25/15  Yes Satira Sark, MD  sertraline (ZOLOFT) 50 MG tablet Take 3 tablets (150 mg total) by mouth daily. 11/20/15  Yes Hoyt Koch, MD  ALPRAZolam Duanne Moron) 1 MG tablet Take 1 mg by mouth 3 (three) times daily as needed for anxiety. Reported on 11/18/2015    Historical Provider, MD  aspirin-acetaminophen-caffeine (EXCEDRIN MIGRAINE) [phone] MG tablet Take 2 tablets by mouth every 6 (six) hours as needed for headache.    Historical Provider, MD  furosemide (LASIX) 20 MG tablet Take 20-40 mg by mouth 2 (two) times daily as needed for fluid.  12/16/14   Historical Provider, MD  gabapentin (NEURONTIN) 300 MG capsule Take 1 capsule (300 mg total) by mouth 3 (three) times daily. Patient not taking: Reported on 12/16/2015 11/21/15   Hoyt Koch, MD  ipratropium-albuterol (DUONEB) 0.5-2.5 (3) MG/3ML SOLN Take 3 mLs by nebulization 3 (three) times daily as needed (shorntess of breath/wheezing). 02/18/15   Kathie Dike, MD  KLOR-CON 10 10 MEQ tablet Take 10 mEq by mouth as needed (Only takes with the furosemide).  11/13/14   Historical Provider, MD  OVER THE COUNTER MEDICATION Place 1 drop into both eyes daily as needed (Dry Eyes).    Historical Provider, MD  PROAIR RESPICLICK 540 (90 BASE) MCG/ACT AEPB Inhale 2 puffs into the lungs every 4 (four) hours as needed (Shortness of breath).  12/27/14   Historical Provider, MD   BP 106/78 mmHg  Pulse 77  Temp(Src) 98 F (36.7 C) (Oral)  Resp 23  Ht '5\' 5"'$  (1.651 m)  Wt 240 lb (108.863 kg)  BMI 39.94 kg/m2  SpO2 92%  LMP 05/13/2011 Physical Exam  Constitutional: She is oriented to person, place, and time. She appears well-developed and  well-nourished. No distress.  Speech slow and deliberate.  HENT:  Head: Normocephalic and atraumatic.  Mouth/Throat: Oropharynx is clear and moist. No oropharyngeal exudate.  Eyes: Conjunctivae and EOM are normal. Pupils are equal, round, and reactive to light.  Neck: Normal range of motion. Neck supple.  No meningismus.  Cardiovascular: Normal rate, regular rhythm, normal heart sounds and intact distal pulses.   No murmur heard. Pulmonary/Chest: Effort normal and breath sounds normal. No respiratory distress.  Abdominal: Soft. There is no tenderness. There is no rebound and no guarding.  Musculoskeletal: Normal range of motion. She exhibits no edema or tenderness.  Neurological: She is alert and oriented to person, place, and time. A cranial nerve deficit is present. She exhibits normal muscle tone. Coordination normal.  CN 2-12 intact, slight R sided facial droop and ptosis.  RUE pronator drift, ataxic on finger to nose on R, normal on L.  Tongue midline. Decreased grip strength 4/5 on R, 5/5 on  L.  5/5 bilateral lower extremities.  Skin: Skin is warm.  Psychiatric: She has a normal mood and affect. Her behavior is normal.  Nursing note and vitals reviewed.   ED Course  Procedures (including critical care time) Labs Review Labs Reviewed  COMPREHENSIVE METABOLIC PANEL - Abnormal; Notable for the following:    Glucose, Bld 108 (*)    BUN 34 (*)    Creatinine, Ser 2.07 (*)    GFR calc non Af Amer 27 (*)    GFR calc Af Amer 31 (*)    All other components within normal limits  CBC WITH DIFFERENTIAL/PLATELET - Abnormal; Notable for the following:    RBC 5.81 (*)    Hemoglobin 16.1 (*)    HCT 49.0 (*)    Platelets 105 (*)    Monocytes Absolute 1.1 (*)    All other components within normal limits  URINE RAPID DRUG SCREEN, HOSP PERFORMED - Abnormal; Notable for the following:    Opiates POSITIVE (*)    Benzodiazepines POSITIVE (*)    All other components within normal limits   URINALYSIS, ROUTINE W REFLEX MICROSCOPIC (NOT AT [facility]) - Abnormal; Notable for the following:    APPearance HAZY (*)    Hgb urine dipstick TRACE (*)    Ketones, ur TRACE (*)    Protein, ur TRACE (*)    Leukocytes, UA MODERATE (*)    All other components within normal limits  URINE MICROSCOPIC-ADD ON - Abnormal; Notable for the following:    Squamous Epithelial / LPF TOO NUMEROUS TO COUNT (*)    Bacteria, UA MANY (*)    Casts GRANULAR CAST (*)    All other components within normal limits  I-STAT CHEM 8, ED - Abnormal; Notable for the following:    BUN 35 (*)    Creatinine, Ser 1.90 (*)    Hemoglobin 17.3 (*)    HCT 51.0 (*)    All other components within normal limits  TROPONIN I  ETHANOL  PROTIME-INR  APTT  HEMOGLOBIN A1C  LIPID PANEL  I-STAT TROPOININ, ED  CBG MONITORING, ED    Imaging Review Dg Chest 2 View  12/16/2015  CLINICAL DATA:  Weakness EXAM: CHEST  2 VIEW COMPARISON:  10/31/2015 FINDINGS: Mild hyperinflation. Heart size mildly enlarged. Negative for heart failure. No infiltrate effusion or mass. Mild compression fractures at 2 levels in the lower thoracic spine unchanged from the prior study. IMPRESSION: No active cardiopulmonary disease. Electronically Signed   By: Franchot Gallo M.D.   On: 12/16/2015 13:10   Ct Head Wo Contrast  12/16/2015  CLINICAL DATA:  Difficulty using the right hand beginning 3 days ago. Dizziness. No known injury. Initial encounter. EXAM: CT HEAD WITHOUT CONTRAST CT CERVICAL SPINE WITHOUT CONTRAST TECHNIQUE: Multidetector CT imaging of the head and cervical spine was performed following the standard protocol without intravenous contrast. Multiplanar CT image reconstructions of the cervical spine were also generated. COMPARISON:  Head CT scan 04/05/2013. FINDINGS: CT HEAD FINDINGS The brain appears normal without hemorrhage, infarct, mass lesion, mass effect, midline shift or abnormal extra-axial fluid collection. No hydrocephalus or  pneumocephalus. The calvarium is intact. Imaged paranasal sinuses are clear. CT CERVICAL SPINE FINDINGS There is no fracture or malalignment cervical spine. Mild loss of disc space height and endplate spurring are seen at C5-6 and C6-7. Facet joints are unremarkable. Lung apices are clear. IMPRESSION: No acute abnormality head or cervical spine. Mild appearing degenerative disease C5-6 and C6-7. Electronically Signed   By: Marcello Moores  Dalessio M.D.   On: 12/16/2015 16:24   Ct Cervical Spine Wo Contrast  12/16/2015  CLINICAL DATA:  Difficulty using the right hand beginning 3 days ago. Dizziness. No known injury. Initial encounter. EXAM: CT HEAD WITHOUT CONTRAST CT CERVICAL SPINE WITHOUT CONTRAST TECHNIQUE: Multidetector CT imaging of the head and cervical spine was performed following the standard protocol without intravenous contrast. Multiplanar CT image reconstructions of the cervical spine were also generated. COMPARISON:  Head CT scan 04/05/2013. FINDINGS: CT HEAD FINDINGS The brain appears normal without hemorrhage, infarct, mass lesion, mass effect, midline shift or abnormal extra-axial fluid collection. No hydrocephalus or pneumocephalus. The calvarium is intact. Imaged paranasal sinuses are clear. CT CERVICAL SPINE FINDINGS There is no fracture or malalignment cervical spine. Mild loss of disc space height and endplate spurring are seen at C5-6 and C6-7. Facet joints are unremarkable. Lung apices are clear. IMPRESSION: No acute abnormality head or cervical spine. Mild appearing degenerative disease C5-6 and C6-7. Electronically Signed   By: Inge Rise M.D.   On: 12/16/2015 16:24   Mr Angiogram Head Wo Contrast  12/16/2015  CLINICAL DATA:  Right-sided weakness which began Saturday. Stroke risk factors include hypertension, and diabetes. LEFT facial and arm numbness. EXAM: MRI HEAD WITHOUT CONTRAST MRA HEAD WITHOUT CONTRAST TECHNIQUE: Multiplanar, multiecho pulse sequences of the brain and surrounding  structures were obtained without intravenous contrast. Angiographic images of the head were obtained using MRA technique without contrast. COMPARISON:  CT head earlier today. FINDINGS: MRI HEAD FINDINGS Multifocal areas of restricted diffusion affect the LEFT posterior frontal cortex and subcortical white matter system consistent with acute infarction. There is no associated hemorrhage, or midline shift. The largest confluent area of acute infarction measures 11 x 15 mm in the periventricular white matter. In the subcortical white matter of the RIGHT frontal lobe, image 45 series 101, there is a punctate focus of increased signal on coronal diffusion imaging, with intermediate ADC value, associated with T2 and FLAIR hyperintensity of the cortex and subcortical white matter in this region, favored to represent an area of subacute infarction which is normalizing on diffusion imaging. Normal for age cerebral volume. Minor white matter disease, likely chronic microvascular ischemic change. No midline abnormalities. In the LEFT cerebellar tonsil there is an area of susceptibility, seen best on gradient sequence but also visible on T1, FLAIR, and T2 images, estimated 7 x 12 mm cross-section, which is of indeterminate etiology, but likely chronic. A cavernoma could have this appearance. Bold trauma or hemorrhagic infarct could appear in a similar fashion. Flow voids are maintained throughout, including the LEFT vertebral artery. Extracranial soft tissues unremarkable. BILATERAL cataract extraction. MRA HEAD FINDINGS Internal carotid arteries are widely patent. Basilar artery is widely patent with vertebrals codominant. No intracranial stenosis or aneurysm. No proximal LEFT MCA stenosis or occlusion. IMPRESSION: Multifocal areas of nonhemorrhagic acute infarction, predominantly affecting the LEFT posterior frontal cortex and subcortical white matter. Suspected subacute RIGHT frontal cortex and subcortical white matter  infarction, also without hemorrhage. 7 x 12 mm focus of chronic hemorrhage in LEFT cerebellar tonsil, which could represent a remote hemorrhagic insult or a incidental cavernoma. Negative MRA.  No proximal LEFT ICA or LEFT MCA lesion is observed. Electronically Signed   By: Staci Righter M.D.   On: 12/16/2015 20:14   Mr Brain Wo Contrast  12/16/2015  CLINICAL DATA:  Right-sided weakness which began Saturday. Stroke risk factors include hypertension, and diabetes. LEFT facial and arm numbness. EXAM: MRI HEAD WITHOUT CONTRAST MRA HEAD  WITHOUT CONTRAST TECHNIQUE: Multiplanar, multiecho pulse sequences of the brain and surrounding structures were obtained without intravenous contrast. Angiographic images of the head were obtained using MRA technique without contrast. COMPARISON:  CT head earlier today. FINDINGS: MRI HEAD FINDINGS Multifocal areas of restricted diffusion affect the LEFT posterior frontal cortex and subcortical white matter system consistent with acute infarction. There is no associated hemorrhage, or midline shift. The largest confluent area of acute infarction measures 11 x 15 mm in the periventricular white matter. In the subcortical white matter of the RIGHT frontal lobe, image 45 series 101, there is a punctate focus of increased signal on coronal diffusion imaging, with intermediate ADC value, associated with T2 and FLAIR hyperintensity of the cortex and subcortical white matter in this region, favored to represent an area of subacute infarction which is normalizing on diffusion imaging. Normal for age cerebral volume. Minor white matter disease, likely chronic microvascular ischemic change. No midline abnormalities. In the LEFT cerebellar tonsil there is an area of susceptibility, seen best on gradient sequence but also visible on T1, FLAIR, and T2 images, estimated 7 x 12 mm cross-section, which is of indeterminate etiology, but likely chronic. A cavernoma could have this appearance. Bold  trauma or hemorrhagic infarct could appear in a similar fashion. Flow voids are maintained throughout, including the LEFT vertebral artery. Extracranial soft tissues unremarkable. BILATERAL cataract extraction. MRA HEAD FINDINGS Internal carotid arteries are widely patent. Basilar artery is widely patent with vertebrals codominant. No intracranial stenosis or aneurysm. No proximal LEFT MCA stenosis or occlusion. IMPRESSION: Multifocal areas of nonhemorrhagic acute infarction, predominantly affecting the LEFT posterior frontal cortex and subcortical white matter. Suspected subacute RIGHT frontal cortex and subcortical white matter infarction, also without hemorrhage. 7 x 12 mm focus of chronic hemorrhage in LEFT cerebellar tonsil, which could represent a remote hemorrhagic insult or a incidental cavernoma. Negative MRA.  No proximal LEFT ICA or LEFT MCA lesion is observed. Electronically Signed   By: Staci Righter M.D.   On: 12/16/2015 20:14   Mr Cervical Spine Wo Contrast  12/16/2015  CLINICAL DATA:  Left-sided facial and arm numbness. Altered mental status. EXAM: MRI CERVICAL SPINE WITHOUT CONTRAST TECHNIQUE: Multiplanar, multisequence MR imaging of the cervical spine was performed. No intravenous contrast was administered. COMPARISON:  CT of the cervical spine earlier today. FINDINGS: The patient was unable to remain motionless for the exam. Small or subtle lesions could be overlooked. Alignment: Anatomic. Vertebrae: No worrisome osseous lesion. Cord: Slight cord flattening at C5-6 and C6-7 without abnormal cord signal. Posterior Fossa: No tonsillar herniation. Vertebral Arteries: Patent. Paraspinal tissues: No masses. Disc levels: The individual disc spaces were examined as follows: C2-3:  Shallow central protrusion.  No impingement. C3-4:  Normal disc space.  Asymmetric facet arthropathy on the LEFT. C4-5:  Mild bulge.  No impingement. C5-6: Central and rightward protrusion. Disc osteophyte complex. Mild  right-sided cord flattening and foraminal narrowing. Disc space narrowing. C6-7: Central and leftward protrusion. Disc osteophyte complex. Mild left-sided cord flattening and foraminal narrowing. Disc space narrowing. C7-T1:  Unremarkable. IMPRESSION: Central and rightward protrusion at C5-6, as well as central and leftward protrusion at C6-7 along with disc osteophyte complex and disc space narrowing contribute to cord compression and foraminal narrowing as described above. Correlate clinically for LEFT C7 radiculopathy with regard to LEFT arm numbness. No acute features are observed. Electronically Signed   By: Staci Righter M.D.   On: 12/16/2015 20:00   I have personally reviewed and evaluated these images  and lab results as part of my medical decision-making.   EKG Interpretation   Date/Time:  Monday December 16 2015 15:27:16 EDT Ventricular Rate:  72 PR Interval:  194 QRS Duration: 109 QT Interval:  421 QTC Calculation: 461 R Axis:   -170 Text Interpretation:  Sinus rhythm Anteroseptal infarct, age indeterminate  Minimal ST elevation, inferior leads Baseline wander in lead(s) V6 flipped  T waves anteriorly Confirmed by Wyvonnia Dusky  MD, Jona Erkkila [phone]) on 12/16/2015  3:36:35 PM      MDM   Final diagnoses:  Cerebrovascular accident (CVA), unspecified mechanism ([place])  Stroke [facility])   Patient with weakness and numbness in her right arm with difficulty with moving it and coordination over the past 3 days. Last seen normal 2 nights ago. Concern for stroke. Code stroke not activated due to delayed presentation.  CT is negative for acute infarcts. Labs with stable platelet count. Slightly worse CKD.  Patient still has right arm numbness and weakness. Discussed with Dr. Darrick Meigs. He recommends MRI of the brain and C-spine before considering admission.  MRI with multiple areas of acute and subacute infarcts. MRI C spine with small amount of disc protrusion causing cord flattening No L  radiculopathy on exam.  Admission d/w Dr. Darrick Meigs.   Ezequiel Essex, MD 12/17/15 [phone]

## 2015-12-16 NOTE — ED Notes (Addendum)
Pt states on Saturday she was holding a glass in her hand and dropped it and then became dizzy and disoriented.  States today her right hand does not seem to work quite right.  Pt has no facial droop or weakness at triage.  Grips strong bilaterally.

## 2015-12-16 NOTE — ED Notes (Signed)
Pt's O2 sats 89% on Room Air. Placed pt on O2@2L  per [place] and will continue to monitor.

## 2015-12-17 ENCOUNTER — Observation Stay (HOSPITAL_COMMUNITY): Payer: Medicare HMO

## 2015-12-17 ENCOUNTER — Encounter (HOSPITAL_COMMUNITY): Payer: Self-pay

## 2015-12-17 DIAGNOSIS — I6310 Cerebral infarction due to embolism of unspecified precerebral artery: Secondary | ICD-10-CM

## 2015-12-17 DIAGNOSIS — I639 Cerebral infarction, unspecified: Secondary | ICD-10-CM | POA: Diagnosis not present

## 2015-12-17 LAB — URINALYSIS, ROUTINE W REFLEX MICROSCOPIC
Bilirubin Urine: NEGATIVE
Glucose, UA: NEGATIVE mg/dL
NITRITE: NEGATIVE
Specific Gravity, Urine: 1.015 (ref 1.005–1.030)
pH: 6 (ref 5.0–8.0)

## 2015-12-17 LAB — RAPID URINE DRUG SCREEN, HOSP PERFORMED
Amphetamines: NOT DETECTED
BARBITURATES: NOT DETECTED
Benzodiazepines: POSITIVE — AB
Cocaine: NOT DETECTED
Opiates: POSITIVE — AB
TETRAHYDROCANNABINOL: NOT DETECTED

## 2015-12-17 LAB — LIPID PANEL
CHOL/HDL RATIO: 7.4 ratio
Cholesterol: 186 mg/dL (ref 0–200)
HDL: 25 mg/dL — AB (ref >40)
LDL Cholesterol: 135 mg/dL — ABNORMAL HIGH (ref 0–99)
Triglycerides: 132 mg/dL (ref <150)
VLDL: 26 mg/dL (ref 0–40)

## 2015-12-17 LAB — URINE MICROSCOPIC-ADD ON

## 2015-12-17 LAB — MRSA PCR SCREENING: MRSA BY PCR: NEGATIVE

## 2015-12-17 MED ORDER — PNEUMOCOCCAL VAC POLYVALENT 25 MCG/0.5ML IJ INJ: 0.5000 mL | INJECTION | INTRAMUSCULAR | Status: DC

## 2015-12-17 MED ORDER — DEXAMETHASONE 0.5 MG PO TABS
0.2500 mg | ORAL_TABLET | ORAL | Status: DC
  Filled 2015-12-17: qty 0.5

## 2015-12-17 NOTE — ED Notes (Addendum)
Patient NPO per MD order at 2320. Speech therapy consult ordered. Patient with some slurred speech and slight hoarseness at times.

## 2015-12-17 NOTE — Progress Notes (Addendum)
pt's daughter brought a 7 mo baby to the unit despite being told that children weren't allowed to come to the units because of flu precautions. Pt's daughter adamant that she was staying with the child. [place] [employer] in to speak with pt and family. Family asked to please take the child to safer environment. The pt's daughter began to scream and cry and the patient requested that she be allowed to sign herself out AMA. She was urged by this Therapist, sports and another RN and counseled about what could happen should she leave without being ready for discharge. The patient was determined to go home. IV was removed without complications. The pt took all belongings and walked out with her family. MD notified

## 2015-12-17 NOTE — ED Notes (Signed)
Patient c/o back pain, heat pad given for lower back.

## 2015-12-17 NOTE — Evaluation (Addendum)
Clinical/Bedside Swallow Evaluation Patient Details  Name: Summer Cook MRN: [id number] Date of Birth: [birth year]  Today's Date: 12/17/2015 Time: SLP Start Time (ACUTE ONLY): 1030 SLP Stop Time (ACUTE ONLY): 1100 SLP Time Calculation (min) (ACUTE ONLY): 30 min  Past Medical History:  Past Medical History  Diagnosis Date  . Essential hypertension, benign   . 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  Last was Prednisone 33m; 2 wk in 10/2012.  She also was on Prednisone bridged with Cellcept briefly but stopped due to lack of insurance.   . Depression   . Thrush, oral 05/29/2011  . Steroid-induced diabetes ([place]   . [place]monary embolism ([place] 04/2012  . Asthma   . Renal insufficiency   . Chronic chest pain   . Normal cardiac stress test 02/2014  . Gout   . Arthritis   . GERD (gastroesophageal reflux disease)   . Allergy    Past Surgical History:  Past Surgical History  Procedure Laterality Date  . Cholecystectomy  2008  . Bone marrow biopsy    . 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 Colonoscopy with esophagogastroduodenoscopy (egd) N/A 01/04/2013    Dr. RGala Romney esophageal plaques with +KOH, hh. Gastric antrum abnormal, bx reactive gastropathy. Anal canal hemorrhoids, colonic tics, normal TI, single polyp (sessile serrated tubular adenoma). Next TCS 12/2017  . Cataract extraction     HPI:  50year old female who  has a past medical history of Essential hypertension, benign; ITP (idiopathic thrombocytopenic purpura) (08/2010); Depression; Thrush, oral (05/29/2011); [place] ([place]; [place] ([place] (04/2012); Asthma; Renal insufficiency; Chronic chest pain; Normal cardiac stress test (02/2014); Gout; Arthritis; GERD (gastroesophageal reflux disease); and Allergy. Pt presented to the hospital with right arm weakness for past 2 days. Patient had trouble holding last in the  right hand and also had trouble writing using the right hand. Patient had some dizziness and also noticed slow speech. Patient had chest pain 2 days ago which she thought was due to acid reflux as patient also had nausea along with that. Denies shortness of breath. No vomiting or diarrhea. CT head in the ED showed no significant abnormality. MRI brain showed multifocal areas of nonhemorrhagic acute infarction dominantly affecting the left posterior frontal cortex and subcortical white matter. Also showed chronic 7 x 12 mm focus of chronic hemorrhage in the left cerebellar tonsil which was also present in MRI from 2014. Patient has a history of ITP, so cannot take aspirin. She has been on tapering dose of Decadron as she is currently in remission ITP. Patient to stop Decadron in next two days. Currently on Decadron 0.25 mg every OD. Pt failed RN swallow screen and BSE ordered as part of stroke protocol   Assessment / Plan / Recommendation Clinical Impression  [name] was seen at bedside as part of stroke protocol after failing RN swallow screen in ER. Pt currently denies difficulty swallowing, but endorses "acid reflux" to the point of vomiting at times. Pt reports seeing Dr. RGala Romneyin the past (2014) [facility]. Oral motor examination is unremarkable except for dry, coated tongue (? thrush), but does clear after oral care and ice chips. Pt shows no overt signs or symptoms of aspiration at bedside and tolerated regular textures and this liquids without incident. Pt able to self feed using both hands (right arm is weaker at this time). Pt lives  with family members; daughter present reports that her mother's speech is a "little bit slower" at this time. Pt able to communicate thoughts with mild delay. Pt/family reports that she is not quite at baseline (regarding speech), but that it has improved since yesterday. Pt may benefit from outpatient SLP  follow up for any residual speech changes. Will follow while in acute setting for diet tolerance and SLE if patient remains in acute setting.     Aspiration Risk  No limitations    Diet Recommendation Regular;Thin liquid   Liquid Administration via: Cup;Straw Medication Administration: Whole meds with liquid Supervision: Patient able to self feed Postural Changes: Seated upright at 90 degrees;Remain upright for at least 30 minutes after po intake    Other  Recommendations Oral Care Recommendations: Oral care BID;Patient independent with oral care Other Recommendations: Clarify dietary restrictions   Follow up Recommendations  Outpatient SLP (If speech deficits persist)   Frequency and Duration min 1 x/week  1 week       Prognosis Prognosis for Safe Diet Advancement: Good      Swallow Study   General Date of Onset: 12/16/15 HPI: 50 year old female who  has a past medical history of Essential hypertension, benign; ITP (idiopathic thrombocytopenic purpura) (08/2010); Depression; Thrush, oral (05/29/2011); Steroid-induced diabetes ([place]); Pulmonary embolism ([place]) (04/2012); Asthma; Renal insufficiency; Chronic chest pain; Normal cardiac stress test (02/2014); Gout; Arthritis; GERD (gastroesophageal reflux disease); and Allergy. Pt presented to the hospital with right arm weakness for past 2 days. Patient had trouble holding last in the right hand and also had trouble writing using the right hand. Patient had some dizziness and also noticed slow speech. Patient had chest pain 2 days ago which she thought was due to acid reflux as patient also had nausea along with that. Denies shortness of breath. No vomiting or diarrhea. CT head in the ED showed no significant abnormality. MRI brain showed multifocal areas of nonhemorrhagic acute infarction dominantly affecting the left posterior frontal cortex and subcortical white matter. Also showed chronic 7 x 12 mm focus of chronic hemorrhage in the left  cerebellar tonsil which was also present in MRI from 2014. Patient has a history of ITP, so cannot take aspirin. She has been on tapering dose of Decadron as she is currently in remission ITP. Patient to stop Decadron in next two days. Currently on Decadron 0.25 mg every OD. Pt failed RN swallow screen and BSE ordered as part of stroke protocol Type of Study: Bedside Swallow Evaluation Previous Swallow Assessment: None on record; EGD 2014 with [place] Diet Prior to this Study: NPO (baseline diet is regular/thin) Temperature Spikes Noted: No Respiratory Status: Room air History of Recent Intubation: No Behavior/Cognition: Alert;Cooperative Oral Cavity Assessment: Dry;Within Functional Limits (? mild thrush on tongue, but did clear with oral care) Oral Care Completed by SLP: Yes Oral Cavity - Dentition: Adequate natural dentition Vision: Functional for self-feeding Self-Feeding Abilities: Able to feed self Patient Positioning: Upright in bed Baseline Vocal Quality: Normal Volitional Cough: Strong Volitional Swallow: Able to elicit    Oral/Motor/Sensory Function Overall Oral Motor/Sensory Function: Within functional limits   Ice Chips Ice chips: Within functional limits Presentation: Spoon   Thin Liquid Thin Liquid: Within functional limits Presentation: Cup;Self Fed;Straw    Nectar Thick Nectar Thick Liquid: Not tested   Honey Thick Honey Thick Liquid: Not tested   Puree Puree: Within functional limits Presentation: Self Fed;Spoon   Solid  G-Codes: Observation status Clinical judgment Swallowing Initial: CH Goal: [place] Discharge:  [place]   Thank you,  Genene Churn, CCC-SLP [phone]    Solid: Within functional limits Presentation: Self Fed        PORTER,DABNEY 12/17/2015,11:15 AM

## 2015-12-17 NOTE — ED Notes (Signed)
Patient asleep at this time. No distress noted

## 2015-12-17 NOTE — Discharge Summary (Signed)
Physician Discharge Summary  Summer Cook [id number] DOB: 50-Aug-1967 DOA: 12/16/2015  PCP: Hoyt Koch, MD  Admit date: 12/16/2015 Discharge date: 12/17/2015  Time spent:  minutes  Recommendations for Outpatient Follow-up:  Left AMA  Discharge Diagnoses:  Active Problems:   ITP (idiopathic thrombocytopenic purpura)   Post-splenectomy   CKD (chronic kidney disease) stage 3, GFR 30-59 ml/min   Stroke [facility])   Discharge Condition: AMA  Diet recommendation: [facility]  Filed Weights   12/16/15 1228 12/17/15 1059  Weight: 108.863 kg (240 lb) 103.5 kg (228 lb 2.8 oz)    History of present illness/Hospital Course:  50 y/o with multiple medical history was admitted for stroke work up. Apparently, patient decided to leave AMA after admission to the floor despite multiple discussion with nursing. She did not want to stay or wait. She went home before me speaking to her   Procedures:  MR (i.e. Studies not automatically included, echos, thoracentesis, etc; not x-rays)  Consultations:  none  Discharge Exam: Filed Vitals:   12/17/15 0804 12/17/15 0902  BP:  107/78  Pulse:  77  Temp: 98 F (36.7 C) 98 F (36.7 C)  Resp:  16     Discharge Instructions Left AMA   Allergies  Allergen Reactions  . Brintellix [Vortioxetine] Itching  . Yellow Jacket Venom Swelling  . Adhesive [Tape] Rash    Please use paper tape      The results of significant diagnostics from this hospitalization (including imaging, microbiology, ancillary and laboratory) are listed below for reference.    Significant Diagnostic Studies: Dg Chest 2 View  12/16/2015  CLINICAL DATA:  Weakness EXAM: CHEST  2 VIEW COMPARISON:  10/31/2015 FINDINGS: Mild hyperinflation. Heart size mildly enlarged. Negative for heart failure. No infiltrate effusion or mass. Mild compression fractures at 2 levels in the lower thoracic spine unchanged from the prior study. IMPRESSION: No active cardiopulmonary disease.  Electronically Signed   By: Franchot Gallo M.D.   On: 12/16/2015 13:10   Ct Head Wo Contrast  12/16/2015  CLINICAL DATA:  Difficulty using the right hand beginning 3 days ago. Dizziness. No known injury. Initial encounter. EXAM: CT HEAD WITHOUT CONTRAST CT CERVICAL SPINE WITHOUT CONTRAST TECHNIQUE: Multidetector CT imaging of the head and cervical spine was performed following the standard protocol without intravenous contrast. Multiplanar CT image reconstructions of the cervical spine were also generated. COMPARISON:  Head CT scan 04/05/2013. FINDINGS: CT HEAD FINDINGS The brain appears normal without hemorrhage, infarct, mass lesion, mass effect, midline shift or abnormal extra-axial fluid collection. No hydrocephalus or pneumocephalus. The calvarium is intact. Imaged paranasal sinuses are clear. CT CERVICAL SPINE FINDINGS There is no fracture or malalignment cervical spine. Mild loss of disc space height and endplate spurring are seen at C5-6 and C6-7. Facet joints are unremarkable. Lung apices are clear. IMPRESSION: No acute abnormality head or cervical spine. Mild appearing degenerative disease C5-6 and C6-7. Electronically Signed   By: Inge Rise M.D.   On: 12/16/2015 16:24   Ct Cervical Spine Wo Contrast  12/16/2015  CLINICAL DATA:  Difficulty using the right hand beginning 3 days ago. Dizziness. No known injury. Initial encounter. EXAM: CT HEAD WITHOUT CONTRAST CT CERVICAL SPINE WITHOUT CONTRAST TECHNIQUE: Multidetector CT imaging of the head and cervical spine was performed following the standard protocol without intravenous contrast. Multiplanar CT image reconstructions of the cervical spine were also generated. COMPARISON:  Head CT scan 04/05/2013. FINDINGS: CT HEAD FINDINGS The brain appears normal without hemorrhage, infarct, mass lesion,  mass effect, midline shift or abnormal extra-axial fluid collection. No hydrocephalus or pneumocephalus. The calvarium is intact. Imaged paranasal sinuses  are clear. CT CERVICAL SPINE FINDINGS There is no fracture or malalignment cervical spine. Mild loss of disc space height and endplate spurring are seen at C5-6 and C6-7. Facet joints are unremarkable. Lung apices are clear. IMPRESSION: No acute abnormality head or cervical spine. Mild appearing degenerative disease C5-6 and C6-7. Electronically Signed   By: Inge Rise M.D.   On: 12/16/2015 16:24   Mr Angiogram Head Wo Contrast  12/16/2015  CLINICAL DATA:  Right-sided weakness which began Saturday. Stroke risk factors include hypertension, and diabetes. LEFT facial and arm numbness. EXAM: MRI HEAD WITHOUT CONTRAST MRA HEAD WITHOUT CONTRAST TECHNIQUE: Multiplanar, multiecho pulse sequences of the brain and surrounding structures were obtained without intravenous contrast. Angiographic images of the head were obtained using MRA technique without contrast. COMPARISON:  CT head earlier today. FINDINGS: MRI HEAD FINDINGS Multifocal areas of restricted diffusion affect the LEFT posterior frontal cortex and subcortical white matter system consistent with acute infarction. There is no associated hemorrhage, or midline shift. The largest confluent area of acute infarction measures 11 x 15 mm in the periventricular white matter. In the subcortical white matter of the RIGHT frontal lobe, image 45 series 101, there is a punctate focus of increased signal on coronal diffusion imaging, with intermediate ADC value, associated with T2 and FLAIR hyperintensity of the cortex and subcortical white matter in this region, favored to represent an area of subacute infarction which is normalizing on diffusion imaging. Normal for age cerebral volume. Minor white matter disease, likely chronic microvascular ischemic change. No midline abnormalities. In the LEFT cerebellar tonsil there is an area of susceptibility, seen best on gradient sequence but also visible on T1, FLAIR, and T2 images, estimated 7 x 12 mm cross-section, which is  of indeterminate etiology, but likely chronic. A cavernoma could have this appearance. Bold trauma or hemorrhagic infarct could appear in a similar fashion. Flow voids are maintained throughout, including the LEFT vertebral artery. Extracranial soft tissues unremarkable. BILATERAL cataract extraction. MRA HEAD FINDINGS Internal carotid arteries are widely patent. Basilar artery is widely patent with vertebrals codominant. No intracranial stenosis or aneurysm. No proximal LEFT MCA stenosis or occlusion. IMPRESSION: Multifocal areas of nonhemorrhagic acute infarction, predominantly affecting the LEFT posterior frontal cortex and subcortical white matter. Suspected subacute RIGHT frontal cortex and subcortical white matter infarction, also without hemorrhage. 7 x 12 mm focus of chronic hemorrhage in LEFT cerebellar tonsil, which could represent a remote hemorrhagic insult or a incidental cavernoma. Negative MRA.  No proximal LEFT ICA or LEFT MCA lesion is observed. Electronically Signed   By: Staci Righter M.D.   On: 12/16/2015 20:14   Mr Brain Wo Contrast  12/16/2015  CLINICAL DATA:  Right-sided weakness which began Saturday. Stroke risk factors include hypertension, and diabetes. LEFT facial and arm numbness. EXAM: MRI HEAD WITHOUT CONTRAST MRA HEAD WITHOUT CONTRAST TECHNIQUE: Multiplanar, multiecho pulse sequences of the brain and surrounding structures were obtained without intravenous contrast. Angiographic images of the head were obtained using MRA technique without contrast. COMPARISON:  CT head earlier today. FINDINGS: MRI HEAD FINDINGS Multifocal areas of restricted diffusion affect the LEFT posterior frontal cortex and subcortical white matter system consistent with acute infarction. There is no associated hemorrhage, or midline shift. The largest confluent area of acute infarction measures 11 x 15 mm in the periventricular white matter. In the subcortical white matter of the RIGHT frontal  lobe, image 45  series 101, there is a punctate focus of increased signal on coronal diffusion imaging, with intermediate ADC value, associated with T2 and FLAIR hyperintensity of the cortex and subcortical white matter in this region, favored to represent an area of subacute infarction which is normalizing on diffusion imaging. Normal for age cerebral volume. Minor white matter disease, likely chronic microvascular ischemic change. No midline abnormalities. In the LEFT cerebellar tonsil there is an area of susceptibility, seen best on gradient sequence but also visible on T1, FLAIR, and T2 images, estimated 7 x 12 mm cross-section, which is of indeterminate etiology, but likely chronic. A cavernoma could have this appearance. Bold trauma or hemorrhagic infarct could appear in a similar fashion. Flow voids are maintained throughout, including the LEFT vertebral artery. Extracranial soft tissues unremarkable. BILATERAL cataract extraction. MRA HEAD FINDINGS Internal carotid arteries are widely patent. Basilar artery is widely patent with vertebrals codominant. No intracranial stenosis or aneurysm. No proximal LEFT MCA stenosis or occlusion. IMPRESSION: Multifocal areas of nonhemorrhagic acute infarction, predominantly affecting the LEFT posterior frontal cortex and subcortical white matter. Suspected subacute RIGHT frontal cortex and subcortical white matter infarction, also without hemorrhage. 7 x 12 mm focus of chronic hemorrhage in LEFT cerebellar tonsil, which could represent a remote hemorrhagic insult or a incidental cavernoma. Negative MRA.  No proximal LEFT ICA or LEFT MCA lesion is observed. Electronically Signed   By: Staci Righter M.D.   On: 12/16/2015 20:14   Mr Cervical Spine Wo Contrast  12/16/2015  CLINICAL DATA:  Left-sided facial and arm numbness. Altered mental status. EXAM: MRI CERVICAL SPINE WITHOUT CONTRAST TECHNIQUE: Multiplanar, multisequence MR imaging of the cervical spine was performed. No intravenous  contrast was administered. COMPARISON:  CT of the cervical spine earlier today. FINDINGS: The patient was unable to remain motionless for the exam. Small or subtle lesions could be overlooked. Alignment: Anatomic. Vertebrae: No worrisome osseous lesion. Cord: Slight cord flattening at C5-6 and C6-7 without abnormal cord signal. Posterior Fossa: No tonsillar herniation. Vertebral Arteries: Patent. Paraspinal tissues: No masses. Disc levels: The individual disc spaces were examined as follows: C2-3:  Shallow central protrusion.  No impingement. C3-4:  Normal disc space.  Asymmetric facet arthropathy on the LEFT. C4-5:  Mild bulge.  No impingement. C5-6: Central and rightward protrusion. Disc osteophyte complex. Mild right-sided cord flattening and foraminal narrowing. Disc space narrowing. C6-7: Central and leftward protrusion. Disc osteophyte complex. Mild left-sided cord flattening and foraminal narrowing. Disc space narrowing. C7-T1:  Unremarkable. IMPRESSION: Central and rightward protrusion at C5-6, as well as central and leftward protrusion at C6-7 along with disc osteophyte complex and disc space narrowing contribute to cord compression and foraminal narrowing as described above. Correlate clinically for LEFT C7 radiculopathy with regard to LEFT arm numbness. No acute features are observed. Electronically Signed   By: Staci Righter M.D.   On: 12/16/2015 20:00   US Carotid Bilateral  12/17/2015  CLINICAL DATA:  Recent stroke. EXAM: BILATERAL CAROTID DUPLEX ULTRASOUND TECHNIQUE: Pearline Cables scale imaging, color Doppler and duplex ultrasound were performed of bilateral carotid and vertebral arteries in the neck. COMPARISON:  None. FINDINGS: Criteria: Quantification of carotid stenosis is based on velocity parameters that correlate the residual internal carotid diameter with NASCET-based stenosis levels, using the diameter of the distal internal carotid lumen as the denominator for stenosis measurement. The following  velocity measurements were obtained: RIGHT ICA:  71 cm/sec CCA:  80 cm/sec SYSTOLIC ICA/CCA RATIO:  0.9 DIASTOLIC ICA/CCA RATIO:  1.5  ECA:  96 cm/sec LEFT ICA:  76 cm/sec CCA:  [id number] cm/sec SYSTOLIC ICA/CCA RATIO:  0.7 DIASTOLIC ICA/CCA RATIO:  0.6 ECA:  73 cm/sec RIGHT CAROTID ARTERY: Question intimal thickening at the carotid bulb and proximal internal carotid artery. Normal waveforms and velocities within the right internal carotid artery. RIGHT VERTEBRAL ARTERY: Antegrade flow and normal waveform in the right vertebral artery. LEFT CAROTID ARTERY: Left carotid arteries are patent without significant plaque or stenosis. Normal waveforms and velocities within the left internal carotid artery. LEFT VERTEBRAL ARTERY: Antegrade flow and normal waveform in the left vertebral artery. IMPRESSION: No significant atherosclerotic disease or stenosis in the carotid arteries. Electronically Signed   By: Markus Daft M.D.   On: 12/17/2015 08:38    Microbiology: No results found for this or any previous visit (from the past 240 hour(s)).   Labs: Basic Metabolic Panel:  Recent Labs Lab 12/16/15 1351 12/16/15 1539  NA 139 138  K 4.5 3.6  CL 106 104  CO2 23  --   GLUCOSE 108* 92  BUN 34* 35*  CREATININE 2.07* 1.90*  CALCIUM 10.2  --    Liver Function Tests:  Recent Labs Lab 12/16/15 1351  AST 38  ALT 22  ALKPHOS 65  BILITOT 0.6  PROT 7.3  ALBUMIN 3.7   No results for input(s): LIPASE, AMYLASE in the last 168 hours. No results for input(s): AMMONIA in the last 168 hours. CBC:  Recent Labs Lab 12/12/15 1326 12/16/15 1351 12/16/15 1539  WBC 7.9 8.7  --   NEUTROABS  --  4.3  --   HGB 15.5* 16.1* 17.3*  HCT 48.6* 49.0* 51.0*  MCV 86.3 84.3  --   PLT 119* 105*  --    Cardiac Enzymes:  Recent Labs Lab 12/16/15 1351  TROPONINI <0.03   BNP: BNP (last 3 results) No results for input(s): BNP in the last 8760 hours.  ProBNP (last 3 results) No results for input(s): PROBNP in the last  8760 hours.  CBG: No results for input(s): GLUCAP in the last 168 hours.     SignedKinnie Feil  [facility] 12/17/2015, 12:59 PM

## 2015-12-18 ENCOUNTER — Encounter: Payer: Self-pay | Admitting: Internal Medicine

## 2015-12-18 ENCOUNTER — Ambulatory Visit (INDEPENDENT_AMBULATORY_CARE_PROVIDER_SITE_OTHER): Payer: Medicare HMO | Admitting: Internal Medicine

## 2015-12-18 VITALS — BP 104/76 | HR 95 | Temp 97.7°F | Resp 18 | Wt 226.0 lb

## 2015-12-18 DIAGNOSIS — I6310 Cerebral infarction due to embolism of unspecified precerebral artery: Secondary | ICD-10-CM | POA: Diagnosis not present

## 2015-12-18 DIAGNOSIS — M5440 Lumbago with sciatica, unspecified side: Secondary | ICD-10-CM | POA: Diagnosis not present

## 2015-12-18 LAB — HEMOGLOBIN A1C
Hgb A1c MFr Bld: 6.2 % — ABNORMAL HIGH (ref 4.8–5.6)
Mean Plasma Glucose: 131 mg/dL

## 2015-12-18 MED ORDER — DULOXETINE HCL 30 MG PO CPEP: 30.0000 mg | ORAL_CAPSULE | Freq: Every day | ORAL | Status: DC

## 2015-12-18 MED ORDER — SERTRALINE HCL 50 MG PO TABS: 100.0000 mg | ORAL_TABLET | Freq: Every day | ORAL | Status: DC

## 2015-12-18 MED ORDER — PROMETHAZINE HCL 25 MG PO TABS: 25.0000 mg | ORAL_TABLET | Freq: Three times a day (TID) | ORAL | Status: DC | PRN

## 2015-12-18 NOTE — Progress Notes (Signed)
Pre visit review using our clinic review tool, if applicable. No additional management support is needed unless otherwise documented below in the visit note. 

## 2015-12-18 NOTE — Patient Instructions (Addendum)
Start taking 100 mg or two pills of the sertraline x 5 days then decrease to 50 mg or 1 pill for 5 days and then stop.  Then start cymbalta 30 mg daily.    An anti-nausea medication was sent to your pharmacy - if the nausea does not improve, please return.    Call with any questions or concerns or side effects.    A referral was ordered for neurology.

## 2015-12-18 NOTE — Assessment & Plan Note (Signed)
New  Left hospital AMA yesterday Symptoms improving,no new or concerning symptoms She is a complicated case because of her ITP and polycythemia Will refer to neuro Following with oncology for ITP Follow up with pcp soon for re-evaluation

## 2015-12-18 NOTE — Progress Notes (Signed)
Subjective:    Patient ID: Summer Cook, female    DOB: 01/22/1966, 50 y.o.   MRN: [id number]  HPI She is here for an acute visit for medication adjustment.   Lower back pain with radiculopathy:  Most days her back pain gets to a 10/10.  She takes gabapentin 300 mg in the morning.  It makes her feel funny in the head and sometimes a little drowsy.  She stopped taking the medication last week.  It did not help the pain that much.  She would like to try something else.  Lyrica requires pre-authorization.  She has never tried cymbalta.  She has been on sertraline for years and is not sure she wants to change.    Two days ago she went to the ED due to decreased movement in her right hand.  Work up in the hospital showed that she had a stroke.  She feels that happened Saturday afternoon. The hand has improved, but she still has weakness in the right hand.  She left AMA. She states she does feel her processing or thinking is slower, but denies confusion, difficulty speaking or difficulty swallowing.   She denies any numbness or tingling. She has weakness in her right arm and hand, but denies weakness elsewhere.  She denies changes in her gait.   MRI/A:   Multifocal areas of nonhemorrhagic acute infarction, predominantly affecting the LEFT posterior frontal cortex and subcortical white matter.  Suspected subacute RIGHT frontal cortex and subcortical white matter infarction, also without hemorrhage.  7 x 12 mm focus of chronic hemorrhage in LEFT cerebellar tonsil, which could represent a remote hemorrhagic insult or a incidental cavernoma.  Negative MRA. No proximal LEFT ICA or LEFT MCA lesion is observe  Medications and allergies reviewed with patient and updated if appropriate.  Patient Active Problem List   Diagnosis Date Noted  . Stroke ([place]) 12/16/2015  . Back pain 11/22/2015  . Asthma, chronic 02/16/2015  . Anxiety state 02/16/2015  . CKD (chronic kidney disease) stage 3,  GFR 30-59 ml/min 04/28/2014  . Nausea with vomiting 01/25/2013  . Abnormal LFTs 01/25/2013  . Orthostatic hypotension 01/25/2013  . Unspecified constipation 12/27/2012  . Rectal bleeding 12/26/2012  . Chronic anticoagulation 12/26/2012  . PE (pulmonary embolism) 05/03/2012  . Renal insufficiency 12/18/2011  . Tachycardia 06/04/2011  . Hemorrhoids 06/04/2011  . 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 Immunosuppressed status ([place]) 06/04/2011  . Depression 06/04/2011  . Obesity 06/04/2011  . Hypertension 03/18/2011  . ITP (idiopathic thrombocytopenic purpura) 03/18/2011    Current Outpatient Prescriptions on File Prior to Visit  Medication Sig Dispense Refill  . ALPRAZolam (XANAX) 1 MG tablet Take 1 mg by mouth 3 (three) times daily as needed for anxiety. Reported on 11/18/2015    . aspirin-acetaminophen-caffeine (EXCEDRIN MIGRAINE) 250-250-65 MG tablet Take 2 tablets by mouth every 6 (six) hours as needed for headache.    . calcitonin, salmon, (MIACALCIN/FORTICAL) 200 UNIT/ACT nasal spray Place 1 spray into alternate nostrils daily.    Marland Kitchen dexamethasone (DECADRON) 0.5 MG tablet Take 0.25 mg daily x 1 week and then 0.25 mg QOD x 2 weeks, then stop. 7 tablet 0  . furosemide (LASIX) 20 MG tablet Take 20-40 mg by mouth 2 (two) times daily as needed for fluid.   2  . gabapentin (NEURONTIN) 300 MG capsule Take 1 capsule (300 mg total) by mouth 3 (three) times daily. 90 capsule 3  . ipratropium-albuterol (DUONEB) 0.5-2.5 (3)  MG/3ML SOLN Take 3 mLs by nebulization 3 (three) times daily as needed (shorntess of breath/wheezing). 360 mL 0  . KLOR-CON 10 10 MEQ tablet Take 10 mEq by mouth as needed (Only takes with the furosemide).   2  . levothyroxine (SYNTHROID, LEVOTHROID) 50 MCG tablet Take 1 tablet (50 mcg total) by mouth daily. 90 tablet 3  . metoprolol succinate (TOPROL-XL) 50 MG 24 hr tablet Take 1 tablet (50 mg total) by mouth daily. 30 tablet 6  . OVER THE  COUNTER MEDICATION Place 1 drop into both eyes daily as needed (Dry Eyes).    Marland Kitchen PROAIR RESPICLICK 381 (90 BASE) MCG/ACT AEPB Inhale 2 puffs into the lungs every 4 (four) hours as needed (Shortness of breath).   3  . sertraline (ZOLOFT) 50 MG tablet Take 3 tablets (150 mg total) by mouth daily. (Patient taking differently: Take 50 mg by mouth daily. ) 90 tablet 3  . [DISCONTINUED] prochlorperazine (COMPAZINE) 10 MG tablet Take 10 mg by mouth as needed. For nausea      No current facility-administered medications on file prior to visit.    Past Medical History  Diagnosis Date  . Essential hypertension, benign   . 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  Last was Prednisone 26m; 2 wk in 10/2012.  She also was on Prednisone bridged with Cellcept briefly but stopped due to lack of insurance.   . Depression   . Thrush, oral 05/29/2011  . Steroid-induced diabetes ([place]   . [place]monary embolism ([place] 04/2012  . Asthma   . Renal insufficiency   . Chronic chest pain   . Normal cardiac stress test 02/2014  . Gout   . Arthritis   . GERD (gastroesophageal reflux disease)   . Allergy     Past Surgical History  Procedure Laterality Date  . Cholecystectomy  2008  . Bone marrow biopsy    . 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 Colonoscopy with esophagogastroduodenoscopy (egd) N/A 01/04/2013    Dr. RGala Romney esophageal plaques with +KOH, hh. Gastric antrum abnormal, bx reactive gastropathy. Anal canal hemorrhoids, colonic tics, normal TI, single polyp (sessile serrated tubular adenoma). Next TCS 12/2017  . Cataract extraction      Social History   Social History  . Marital Status: Divorced    Spouse Name: N/A  . Number of Children: 1  . Years of Education: N/A   Occupational History  .      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    Social History Main Topics  . Smoking status: Former Smoker -- 0 years     Types: Cigarettes    Quit date: 02/14/2009  . Smokeless tobacco: Never Used  . Alcohol Use: No  . Drug Use: No  . Sexual Activity: No   Other Topics Concern  . Not on file   Social History Narrative    Family History  Problem Relation Age of Onset  . Cervical cancer Mother   . Lung cancer Father   . Pneumonia Brother   . Cancer Paternal Grandmother 862   colon cancer, breast cancer  . Cancer Paternal Grandfather 610   colon cancer  . AAA (abdominal aortic aneurysm) Paternal Grandmother     Review of Systems  Constitutional: Negative for fever.  HENT: Negative for trouble swallowing.   Respiratory: Positive for cough (mild) and shortness of breath (with exertion). Negative for wheezing.   Cardiovascular:  Negative for chest pain, palpitations and leg swelling.  Musculoskeletal: Positive for back pain (radiating pain to both legs).  Neurological: Positive for weakness (right lower arm and hand) and headaches. Negative for dizziness, speech difficulty, light-headedness and numbness.  Psychiatric/Behavioral: Negative for confusion.       Slower processing - no confusion       Objective:   Filed Vitals:   12/18/15 1413  BP: 104/76  Pulse: 95  Temp: 97.7 F (36.5 C)  Resp: 18   Filed Weights   12/18/15 1413  Weight: 226 lb (102.513 kg)   Body mass index is 37.61 kg/(m^2).   Physical Exam Constitutional: Appears well-developed and well-nourished. No distress.  Neck: Neck supple. No tracheal deviation present. No thyromegaly present.  No carotid bruit. No cervical adenopathy.   Cardiovascular: Normal rate, regular rhythm and normal heart sounds.   No murmur heard.  No edema Pulmonary/Chest: Effort normal and breath sounds normal. No respiratory distress. No wheezes.  Neuro: no facial droop, thinking is slow, but I do not know her baseline, right hand and mild right weakness, normal strength left arm and leg, slightly abnormal gait - seems wobbly - baseline not known  to me      Assessment & Plan:   See Problem List for Assessment and Plan of chronic medical problems.  Follow up with Dr. Sharlet Salina soon

## 2015-12-18 NOTE — Assessment & Plan Note (Signed)
Sounds like lumbar radiculopathy She self d/c'd gabapentin - it was not helping Discussed option - lyrica - we could try for prior auth, cymbalta but would need to d/c sertraline or referral to ortho  She will try cymbalta - I advised it may or may not help and may or may not control her anxiety / depression as well as the sertraline D/c sertraline - taper off.  Start cymbalta once she is off the sertraline Follow up with dr Sharlet Salina  Call with questions or concerns

## 2015-12-20 ENCOUNTER — Encounter: Payer: Medicare HMO | Admitting: Adult Health

## 2015-12-20 NOTE — Progress Notes (Signed)
Cardiology Office Note   Date:  12/20/2015   ERROR

## 2015-12-23 ENCOUNTER — Ambulatory Visit (INDEPENDENT_AMBULATORY_CARE_PROVIDER_SITE_OTHER): Payer: Medicare HMO | Admitting: Cardiology

## 2015-12-23 ENCOUNTER — Encounter: Payer: Self-pay | Admitting: Cardiology

## 2015-12-23 VITALS — BP 92/50 | HR 92 | Ht 65.0 in | Wt 224.0 lb

## 2015-12-23 DIAGNOSIS — F411 Generalized anxiety disorder: Secondary | ICD-10-CM | POA: Diagnosis not present

## 2015-12-23 DIAGNOSIS — N183 Chronic kidney disease, stage 3 unspecified: Secondary | ICD-10-CM

## 2015-12-23 DIAGNOSIS — E669 Obesity, unspecified: Secondary | ICD-10-CM

## 2015-12-23 DIAGNOSIS — M549 Dorsalgia, unspecified: Secondary | ICD-10-CM

## 2015-12-23 DIAGNOSIS — D693 Immune thrombocytopenic purpura: Secondary | ICD-10-CM | POA: Diagnosis not present

## 2015-12-23 DIAGNOSIS — I63449 Cerebral infarction due to embolism of unspecified cerebellar artery: Secondary | ICD-10-CM

## 2015-12-23 DIAGNOSIS — G8929 Other chronic pain: Secondary | ICD-10-CM

## 2015-12-23 DIAGNOSIS — R69 Illness, unspecified: Secondary | ICD-10-CM | POA: Diagnosis not present

## 2015-12-23 MED ORDER — ATORVASTATIN CALCIUM 40 MG PO TABS: 40.0000 mg | ORAL_TABLET | Freq: Every day | ORAL | Status: DC

## 2015-12-23 NOTE — Progress Notes (Signed)
12/23/2015 Summer Cook   [birth year]  [id number]  Primary Physician Hoyt Koch, MD Primary Cardiologist: Dr Domenic Polite  HPI:  50 y/o obese Caucasian female, currently unemployed former [employer], seen by Dr Domenic Polite in the past for chest pain. Echo in 2014 showed NL LVF with grade 1 DD. Myoview  June 2015 was negative for ischemia. LOV was July 2015.          The pt has a history of tachycardia and is on chronic beta blocker. She has ITP and is s/p splenectomy and followed by Heme/Onc here in [place]. She was admitted to [facility] 12/16/15 with numbness and weakness in her Rt arm and hand. She was admitted then signed out [place]. She is seen now in the office with her Aunt after her MRI 12/16/15 showed multifocal bilateral non hemorraghic stroke. Symptomatically she is improved though it took a couple of days. She denies any tachycardia or palpitations. There is no past history of PAF.    Current Outpatient Prescriptions  Medication Sig Dispense Refill  . aspirin-acetaminophen-caffeine (EXCEDRIN MIGRAINE) 250-250-65 MG tablet Take 2 tablets by mouth every 6 (six) hours as needed for headache.    . calcitonin, salmon, (MIACALCIN/FORTICAL) 200 UNIT/ACT nasal spray Place 1 spray into alternate nostrils daily.    . DULoxetine (CYMBALTA) 30 MG capsule Take 1 capsule (30 mg total) by mouth daily. 30 capsule 3  . furosemide (LASIX) 20 MG tablet Take 20-40 mg by mouth 2 (two) times daily as needed for fluid.   2  . ipratropium-albuterol (DUONEB) 0.5-2.5 (3) MG/3ML SOLN Take 3 mLs by nebulization 3 (three) times daily as needed (shorntess of breath/wheezing). 360 mL 0  . KLOR-CON 10 10 MEQ tablet Take 10 mEq by mouth as needed (Only takes with the furosemide).   2  . levothyroxine (SYNTHROID, LEVOTHROID) 50 MCG tablet Take 1 tablet (50 mcg total) by mouth daily. 90 tablet 3  . metoprolol succinate (TOPROL-XL) 50 MG 24 hr tablet Take 1 tablet (50 mg total) by mouth daily. 30 tablet 6  . OVER THE  COUNTER MEDICATION Place 1 drop into both eyes daily as needed (Dry Eyes).    Marland Kitchen PROAIR RESPICLICK 123XX123 (90 BASE) MCG/ACT AEPB Inhale 2 puffs into the lungs every 4 (four) hours as needed (Shortness of breath).   3  . promethazine (PHENERGAN) 25 MG tablet Take 1 tablet (25 mg total) by mouth every 8 (eight) hours as needed for nausea or vomiting. 20 tablet 0  . ALPRAZolam (XANAX) 1 MG tablet Take 1 mg by mouth 3 (three) times daily as needed for anxiety. Reported on 12/23/2015    . atorvastatin (LIPITOR) 40 MG tablet Take 1 tablet (40 mg total) by mouth daily. 90 tablet 3  . [DISCONTINUED] prochlorperazine (COMPAZINE) 10 MG tablet Take 10 mg by mouth as needed. For nausea      No current facility-administered medications for this visit.    Allergies  Allergen Reactions  . Brintellix [Vortioxetine] Itching  . Yellow Jacket Venom Swelling  . Adhesive [Tape] Rash    Please use paper tape    Social History   Social History  . Marital Status: Divorced    Spouse Name: N/A  . Number of Children: 1  . Years of Education: N/A   Occupational History  .      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    Social History Main Topics  . Smoking status: Former Smoker -- 0 years    Types:  Cigarettes    Quit date: 02/14/2009  . Smokeless tobacco: Never Used  . Alcohol Use: No  . Drug Use: No  . Sexual Activity: No   Other Topics Concern  . Not on file   Social History Narrative     Review of Systems: General: negative for chills, fever, night sweats or weight changes.  Cardiovascular: negative for chest pain, dyspnea on exertion, edema, orthopnea, palpitations, paroxysmal nocturnal dyspnea or shortness of breath Dermatological: negative for rash Respiratory: negative for cough or wheezing Urologic: negative for hematuria Abdominal: negative for nausea, vomiting, diarrhea, bright red blood per rectum, melena, or hematemesis Neurologic: negative for visual changes, syncope, or dizziness All other  systems reviewed and are otherwise negative except as noted above.    Blood pressure 92/50, pulse 92, height 5\' 5"  (1.651 m), weight 224 lb (101.606 kg), last menstrual period 05/13/2011, SpO2 92 %.  General appearance: cooperative, no distress, morbidly obese and slowed mentation Neck: no carotid bruit and no JVD Lungs: clear to auscultation bilaterally Heart: regular rate and rhythm Abdomen: obese, non tender Extremities: no edema Pulses: 2+ and symmetric Skin: Skin color, texture, turgor normal. No rashes or lesions Neurologic: Grossly normal- mentyal status is not normal. She appears impaired. Her eyes are bloodshot. She is a little unsteady. Her voice is slightly slurred and she frequently lost her train of thought. I could detect no odor of ETOH  EKG from 12/16/15- NSR, poor ant RW, Q in V2  ASSESSMENT AND PLAN:   Cerebrovascular accident (CVA) due to embolism ([place]) Pt seen for recent stroke- MRI showed multifocal areas of nonhemorrhagic acute infarction 12/16/15  CKD (chronic kidney disease) stage 3, GFR 30-59 ml/min SCr 1.9  Obesity BMI 37  ITP (idiopathic thrombocytopenic purpura) Followed by Oncology/Hematology-Plts 105k Not on ASA  Anxiety state On high dose Xanax  Chronic back pain .  PLAN  Reviewed with Dr Jacinta Shoe. She needs a TEE and loop recorder. No antiplatelet therapy for now with her history of ITP. Once her TEE is done she'll need to f/u with Dr Domenic Polite to review her options. She has an appointment with the neurologist tomorrow.  I don't know her baseline mental status but she definitely appeared impaired today- ? Secondary to Xanax.   Kerin Ransom K PA-C 12/23/2015 2:18 PM

## 2015-12-23 NOTE — Assessment & Plan Note (Signed)
On high dose Xanax

## 2015-12-23 NOTE — Assessment & Plan Note (Signed)
Followed by Oncology/Hematology-Plts 105k Not on ASA

## 2015-12-23 NOTE — Assessment & Plan Note (Signed)
Pt seen for recent stroke- MRI showed multifocal areas of nonhemorrhagic acute infarction 12/16/15

## 2015-12-23 NOTE — Patient Instructions (Signed)
Medication Instructions:  START LIPITOR 40 MG DAILY  Labwork: NONE  Testing/Procedures: Your physician has requested that you have a TEE. During a TEE, sound waves are used to create images of your heart. It provides your doctor with information about the size and shape of your heart and how well your heart's chambers and valves are working. In this test, a transducer is attached to the end of a flexible tube that's guided down your throat and into your esophagus (the tube leading from you mouth to your stomach) to get a more detailed image of your heart. You are not awake for the procedure. Please see the instruction sheet given to you today. For further information please visit [email or web address].    Follow-Up: Your physician recommends that you schedule a follow-up appointment in: [place]     Any Other Special Instructions Will Be Listed Below (If Applicable).     If you need a refill on your cardiac medications before your next appointment, please call your pharmacy.

## 2015-12-23 NOTE — Assessment & Plan Note (Signed)
SCr 1.9

## 2015-12-23 NOTE — Assessment & Plan Note (Signed)
BMI 37  

## 2015-12-24 ENCOUNTER — Encounter (HOSPITAL_COMMUNITY)
Admission: RE | Disposition: A | Discharge status: Home / Self Care | Payer: Self-pay | Source: Ambulatory Visit | Attending: Cardiovascular Disease

## 2015-12-24 ENCOUNTER — Ambulatory Visit: Payer: Medicare HMO | Admitting: Neurology

## 2015-12-24 ENCOUNTER — Ambulatory Visit (HOSPITAL_COMMUNITY)
Admission: RE | Admit: 2015-12-24 | Discharge: 2015-12-24 | Disposition: A | Discharge status: Home / Self Care | Payer: Medicare HMO | Source: Ambulatory Visit | Attending: Cardiology | Admitting: Cardiology

## 2015-12-24 ENCOUNTER — Inpatient Hospital Stay (HOSPITAL_COMMUNITY): Admission: RE | Admit: 2015-12-24 | Payer: Medicare HMO | Source: Ambulatory Visit

## 2015-12-24 SURGERY — ECHOCARDIOGRAM, TRANSESOPHAGEAL
Anesthesia: Monitor Anesthesia Care

## 2015-12-24 NOTE — OR Nursing (Signed)
Patient arrived for a TEE. Procedure is scheduled with MAC and booked in Endo room 2 with moderate sedation. No orders were entered on patient. Dr. Bronson Ing called and said patient needed to be scheduled with Dr. Domenic Polite next week. Patient informed. Alphonsus Sias at [facility] notified.

## 2015-12-25 ENCOUNTER — Other Ambulatory Visit: Payer: Self-pay | Admitting: Cardiology

## 2015-12-25 DIAGNOSIS — Z8673 Personal history of transient ischemic attack (TIA), and cerebral infarction without residual deficits: Secondary | ICD-10-CM

## 2015-12-25 NOTE — H&P (Signed)
12/23/2015 Summer Cook  01/04/1966  [id number]  Primary Physician Hoyt Koch, MD Primary Cardiologist: Dr Domenic Polite  HPI: 50 y/o obese Caucasian female, currently unemployed former [employer], seen by Dr Domenic Polite in the past for chest pain. Echo in 2014 showed NL LVF with grade 1 DD. Myoview June 2015 was negative for ischemia. LOV was July 2015.  The pt has a history of tachycardia and is on chronic beta blocker. She has ITP and is s/p splenectomy and followed by Heme/Onc here in [place]. She was admitted to [facility] 12/16/15 with numbness and weakness in her Rt arm and hand. She was admitted then signed out [place]. She is seen now in the office with her Aunt after her MRI 12/16/15 showed multifocal bilateral non hemorraghic stroke. Symptomatically she is improved though it took a couple of days. She denies any tachycardia or palpitations. There is no past history of PAF.    Current Outpatient Prescriptions  Medication Sig Dispense Refill  . aspirin-acetaminophen-caffeine (EXCEDRIN MIGRAINE) 250-250-65 MG tablet Take 2 tablets by mouth every 6 (six) hours as needed for headache.    . calcitonin, salmon, (MIACALCIN/FORTICAL) 200 UNIT/ACT nasal spray Place 1 spray into alternate nostrils daily.    . DULoxetine (CYMBALTA) 30 MG capsule Take 1 capsule (30 mg total) by mouth daily. 30 capsule 3  . furosemide (LASIX) 20 MG tablet Take 20-40 mg by mouth 2 (two) times daily as needed for fluid.   2  . ipratropium-albuterol (DUONEB) 0.5-2.5 (3) MG/3ML SOLN Take 3 mLs by nebulization 3 (three) times daily as needed (shorntess of breath/wheezing). 360 mL 0  . KLOR-CON 10 10 MEQ tablet Take 10 mEq by mouth as needed (Only takes with the furosemide).   2  . levothyroxine (SYNTHROID, LEVOTHROID) 50 MCG tablet Take 1 tablet (50 mcg total) by mouth daily. 90 tablet 3  . metoprolol succinate (TOPROL-XL) 50 MG 24 hr tablet Take 1 tablet (50 mg  total) by mouth daily. 30 tablet 6  . OVER THE COUNTER MEDICATION Place 1 drop into both eyes daily as needed (Dry Eyes).    Marland Kitchen PROAIR RESPICLICK 123XX123 (90 BASE) MCG/ACT AEPB Inhale 2 puffs into the lungs every 4 (four) hours as needed (Shortness of breath).   3  . promethazine (PHENERGAN) 25 MG tablet Take 1 tablet (25 mg total) by mouth every 8 (eight) hours as needed for nausea or vomiting. 20 tablet 0  . ALPRAZolam (XANAX) 1 MG tablet Take 1 mg by mouth 3 (three) times daily as needed for anxiety. Reported on 12/23/2015    . atorvastatin (LIPITOR) 40 MG tablet Take 1 tablet (40 mg total) by mouth daily. 90 tablet 3  . [DISCONTINUED] prochlorperazine (COMPAZINE) 10 MG tablet Take 10 mg by mouth as needed. For nausea      No current facility-administered medications for this visit.    Allergies  Allergen Reactions  . Brintellix [Vortioxetine] Itching  . Yellow Jacket Venom Swelling  . Adhesive [Tape] Rash    Please use paper tape    Social History   Social History  . Marital Status: Divorced    Spouse Name: N/A  . Number of Children: 1  . Years of Education: N/A   Occupational History  .      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    Social History Main Topics  . Smoking status: Former Smoker -- 0 years    Types: Cigarettes    Quit date: 02/14/2009  . Smokeless tobacco: Never  Used  . Alcohol Use: No  . Drug Use: No  . Sexual Activity: No   Other Topics Concern  . Not on file   Social History Narrative     Review of Systems: General: negative for chills, fever, night sweats or weight changes.  Cardiovascular: negative for chest pain, dyspnea on exertion, edema, orthopnea, palpitations, paroxysmal nocturnal dyspnea or shortness of breath Dermatological: negative for rash Respiratory: negative for cough or wheezing Urologic: negative for hematuria Abdominal:  negative for nausea, vomiting, diarrhea, bright red blood per rectum, melena, or hematemesis Neurologic: negative for visual changes, syncope, or dizziness All other systems reviewed and are otherwise negative except as noted above.    Blood pressure 92/50, pulse 92, height 5\' 5"  (1.651 m), weight 224 lb (101.606 kg), last menstrual period 05/13/2011, SpO2 92 %.  General appearance: cooperative, no distress, morbidly obese and slowed mentation Neck: no carotid bruit and no JVD Lungs: clear to auscultation bilaterally Heart: regular rate and rhythm Abdomen: obese, non tender Extremities: no edema Pulses: 2+ and symmetric Skin: Skin color, texture, turgor normal. No rashes or lesions Neurologic: Grossly normal- mentyal status is not normal. She appears impaired. Her eyes are bloodshot. She is a little unsteady. Her voice is slightly slurred and she frequently lost her train of thought. I could detect no odor of ETOH  EKG from 12/16/15- NSR, poor ant RW, Q in V2  ASSESSMENT AND PLAN:   Cerebrovascular accident (CVA) due to embolism ([place]) Pt seen for recent stroke- MRI showed multifocal areas of nonhemorrhagic acute infarction 12/16/15  CKD (chronic kidney disease) stage 3, GFR 30-59 ml/min SCr 1.9  Obesity BMI 37  ITP (idiopathic thrombocytopenic purpura) Followed by Oncology/Hematology-Plts 105k Not on ASA  Anxiety state On high dose Xanax  Chronic back pain .  PLAN Reviewed with Dr Jacinta Shoe. She needs a TEE and loop recorder. No antiplatelet therapy for now with her history of ITP. Once her TEE is done she'll need to f/u with Dr Domenic Polite to review her options. She has an appointment with the neurologist tomorrow. I don't know her baseline mental status but she definitely appeared impaired today- ? Secondary to Xanax.   Erlene Quan PA-C 12/23/2015        [facility] has been requested to perform a transesophageal echocardiogram on Summer Cook [birth year] for  stroke.  After careful review of history and examination, the risks and benefits of transesophageal echocardiogram have been explained including risks of esophageal damage, perforation (1:10,000 risk), bleeding, pharyngeal hematoma as well as other potential complications associated with conscious sedation including aspiration, arrhythmia, respiratory failure and death. Alternatives to treatment were discussed, questions were answered. Patient is willing to proceed.   Erlene Quan,  12/25/2015 9:23 AM      Attending addendum 12/25/2015:  I have not seen this patient since 2015 and was not involved in her recent assessment. I reviewed the chart in detail. She was recently admitted to the hospital with symptoms concerning for stroke, although signed out AMA and was not actually seen formally by neurology (case discussed by phone). She did undergo a head MRI which was ultimately found to be abnormal showing areas of multifocal nonhemorrhagic infarct, and was then was seen in our office by Mr. Reino Bellis on March 27. Case was discussed with Dr. Bronson Ing with plan for her to wear a 30 day event monitor to exclude PAF, and also scheduling of a TEE. Dr. Bronson Ing elected to cancel the TEE for March 28 and reschedule  it with me on April 7. I see that this procedure is scheduled with anesthesia to manage sedation (she is on fairly high dose Xanax at home).   Satira Sark, M.D., F.A.C.C.

## 2015-12-26 ENCOUNTER — Encounter (HOSPITAL_COMMUNITY): Payer: Medicare HMO

## 2015-12-26 DIAGNOSIS — D693 Immune thrombocytopenic purpura: Secondary | ICD-10-CM

## 2015-12-26 LAB — CBC
HEMATOCRIT: 47.2 % — AB (ref 36.0–46.0)
HEMOGLOBIN: 15.5 g/dL — AB (ref 12.0–15.0)
MCH: 27.6 pg (ref 26.0–34.0)
MCHC: 32.8 g/dL (ref 30.0–36.0)
MCV: 84.1 fL (ref 78.0–100.0)
Platelets: 136 10*3/uL — ABNORMAL LOW (ref 150–400)
RBC: 5.61 MIL/uL — AB (ref 3.87–5.11)
RDW: 15.3 % (ref 11.5–15.5)
WBC: 10.8 10*3/uL — ABNORMAL HIGH (ref 4.0–10.5)

## 2015-12-30 NOTE — Patient Instructions (Signed)
Summer Cook  12/30/2015     @PREFPERIOPPHARMACY @   Your procedure is scheduled on  01/03/2016 .  Report to [facility] at  1000  A.M.  Call this number if you have problems the morning of surgery:  [phone]   Remember:  Do not eat food or drink liquids after midnight.  Take these medicines the morning of surgery with A SIP OF WATER  Xanax, cymbalta, levothyroxine, metorpolol, phenergan. Take your duoneb and proair before you come.   Do not wear jewelry, make-up or nail polish.  Do not wear lotions, powders, or perfumes.  You may wear deodorant.  Do not shave 48 hours prior to surgery.  Men may shave face and neck.  Do not bring valuables to the hospital.  [facility] is not responsible for any belongings or valuables.  Contacts, dentures or bridgework may not be worn into surgery.  Leave your suitcase in the car.  After surgery it may be brought to your room.  For patients admitted to the hospital, discharge time will be determined by your treatment team.  Patients discharged the day of surgery will not be allowed to drive home.   Name and phone number of your driver:   family Special instructions:  none  Please read over the following fact sheets that you were given. Coughing and Deep Breathing, Surgical Site Infection Prevention, Anesthesia Post-op Instructions and Care and Recovery After Surgery      Transesophageal Echocardiogram Transesophageal echocardiography (TEE) is a special type of test that produces images of the heart by using sound waves (echocardiogram). This type of echocardiography can obtain better images of the heart than standard echocardiography. TEE is done by passing a flexible tube down the esophagus. The heart is located in front of the esophagus. Because the heart and esophagus are close to one another, your health care provider can take very clear, detailed pictures of the heart via ultrasound waves. TEE may be done:  If your  health care provider needs more information based on standard echocardiography findings.  If you had a stroke. This might have happened because a clot formed in your heart. TEE can visualize different areas of the heart and check for clots.  To check valve anatomy and function.  To check for infection on the inside of your heart (endocarditis).  To evaluate the dividing wall (septum) of the heart and presence of a hole that did not close after birth (patent foramen ovale or atrial septal defect).  To help diagnose a tear in the wall of the aorta (aortic dissection).  During cardiac valve surgery. This allows the surgeon to assess the valve repair before closing the chest.  During a variety of other cardiac procedures to guide positioning of catheters.  Sometimes before a cardioversion, which is a shock to convert heart rhythm back to normal. LET [facility] CARE PROVIDER KNOW ABOUT:   Any allergies you have.  All medicines you are taking, including vitamins, herbs, eye drops, creams, and over-the-counter medicines.  Previous problems you or members of your family have had with the use of anesthetics.  Any blood disorders you have.  Previous surgeries you have had.  Medical conditions you have.  Swallowing difficulties.  An esophageal obstruction. RISKS AND COMPLICATIONS  Generally, TEE is a safe procedure. However, as with any procedure, complications can occur. Possible complications include an esophageal tear (rupture). BEFORE THE PROCEDURE   Do not eat or drink  for 6 hours before the procedure or as directed by your health care provider.  Arrange for someone to drive you home after the procedure. Do not drive yourself home. During the procedure, you will be given medicines that can continue to make you feel drowsy and can impair your reflexes.  An IV access tube will be started in the arm. PROCEDURE   A medicine to help you relax (sedative) will be given through the IV  access tube.  A medicine may be sprayed or gargled to numb the back of the throat.  Your blood pressure, heart rate, and breathing (vital signs) will be monitored during the procedure.  The TEE probe is a long, flexible tube. The tip of the probe is placed into the back of the mouth, and you will be asked to swallow. This helps to pass the tip of the probe into the esophagus. Once the tip of the probe is in the correct area, your health care provider can take pictures of the heart.  TEE is usually not a painful procedure. You may feel the probe press against the back of the throat. The probe does not enter the trachea and does not affect your breathing. AFTER THE PROCEDURE   You will be in bed, resting, until you have fully returned to consciousness.  When you first awaken, your throat may feel slightly sore and will probably still feel numb. This will improve slowly over time.  You will not be allowed to eat or drink until it is clear that the numbness has improved.  Once you have been able to drink, urinate, and sit on the edge of the bed without feeling sick to your stomach (nausea) or dizzy, you may be cleared to go home.  You should have a friend or family member with you for the next 24 hours after your procedure.   This information is not intended to replace advice given to you by your health care provider. Make sure you discuss any questions you have with your health care provider.   Document Released: 12/05/2002 Document Revised: 09/19/2013 Document Reviewed: 03/16/2013 Elsevier Interactive Patient Education 2016 [place]. Transesophageal Echocardiogram Transesophageal echocardiography (TEE) is a picture test of your heart using sound waves. The pictures taken can give very detailed pictures of your heart. This can help your doctor see if there are problems with your heart. TEE can check:  If your heart has blood clots in it.  How well your heart valves are working.  If  you have an infection on the inside of your heart.  Some of the major arteries of your heart.  If your heart valve is working after a Office manager.  Your heart before a procedure that uses a shock to your heart to get the rhythm back to normal. BEFORE THE PROCEDURE  Do not eat or drink for 6 hours before the procedure or as told by your doctor.  Make plans to have someone drive you home after the procedure. Do not drive yourself home.  An IV tube will be put in your arm. PROCEDURE  You will be given a medicine to help you relax (sedative). It will be given through the IV tube.  A numbing medicine will be sprayed or gargled in the back of your throat to help numb it.  The tip of the probe is placed into the back of your mouth. You will be asked to swallow. This helps to pass the probe into your esophagus.  Once the tip  of the probe is in the right place, your doctor can take pictures of your heart.  You may feel pressure at the back of your throat. AFTER THE PROCEDURE  You will be taken to a recovery area so the sedative can wear off.  Your throat may be sore and scratchy. This will go away slowly over time.  You will go home when you are fully awake and able to swallow liquids.  You should have someone stay with you for the next 24 hours.  Do not drive or operate machinery for the next 24 hours.   This information is not intended to replace advice given to you by your health care provider. Make sure you discuss any questions you have with your health care provider.   Document Released: 07/12/2009 Document Revised: 09/19/2013 Document Reviewed: 03/16/2013 Elsevier Interactive Patient Education 2016 [employer] PATIENT INSTRUCTIONS POST-ANESTHESIA  IMMEDIATELY FOLLOWING SURGERY:  Do not drive or operate machinery for the first twenty four hours after surgery.  Do not make any important decisions for twenty four hours after surgery or while taking narcotic pain medications or  sedatives.  If you develop intractable nausea and vomiting or a severe headache please notify your doctor immediately.  FOLLOW-UP:  Please make an appointment with your surgeon as instructed. You do not need to follow up with anesthesia unless specifically instructed to do so.  WOUND CARE INSTRUCTIONS (if applicable):  Keep a dry clean dressing on the anesthesia/puncture wound site if there is drainage.  Once the wound has quit draining you may leave it open to air.  Generally you should leave the bandage intact for twenty four hours unless there is drainage.  If the epidural site drains for more than 36-48 hours please call the anesthesia department.  QUESTIONS?:  Please feel free to call your physician or the hospital operator if you have any questions, and they will be happy to assist you.

## 2015-12-31 ENCOUNTER — Encounter (HOSPITAL_COMMUNITY)
Admit: 2015-12-31 | Discharge: 2015-12-31 | Disposition: A | Discharge status: Home / Self Care | Payer: Medicare HMO | Attending: Cardiology | Admitting: Cardiology

## 2015-12-31 ENCOUNTER — Encounter (HOSPITAL_COMMUNITY): Payer: Self-pay

## 2016-01-02 ENCOUNTER — Ambulatory Visit: Payer: Medicare HMO | Admitting: Internal Medicine

## 2016-01-02 IMAGING — US US EXTREM LOW VENOUS BILAT
1 series · 13 of 24 positions shown · non-contrast
Comparison: None.

CLINICAL DATA: Bilateral lower extremity swelling and pain for 1
week.



[Series 1: us extrem low venous bilat · 0.10mm/px · 13 of 57 slices shown]
[im 1/57]
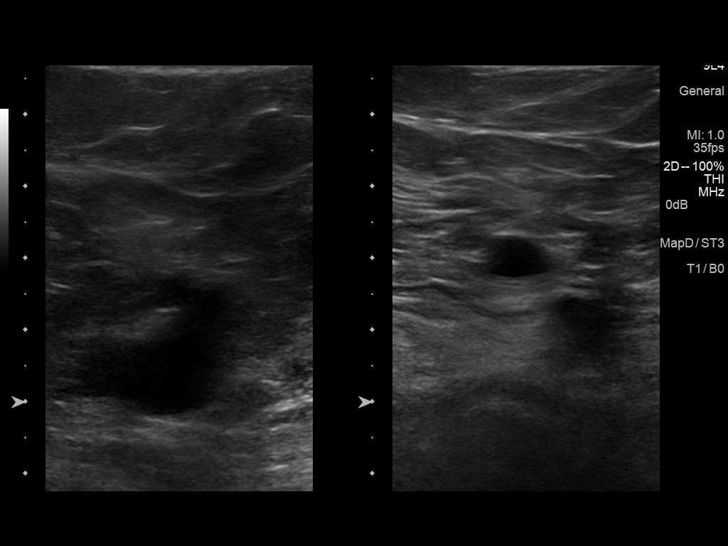
[im 5/57]
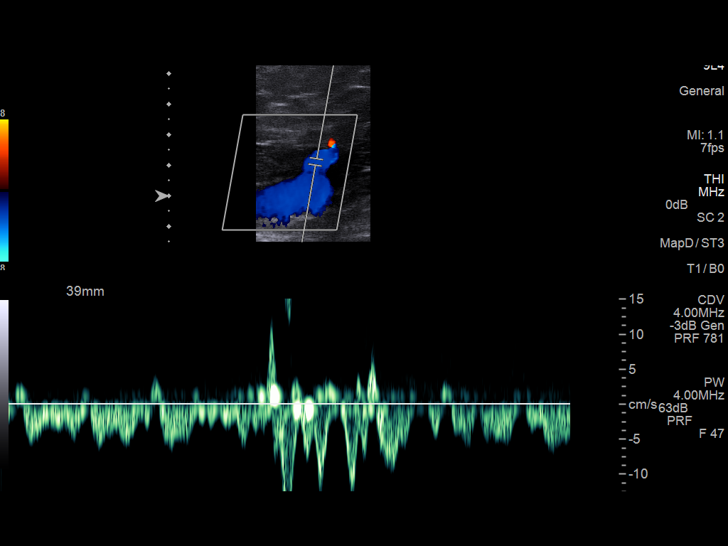
[im 10/57]
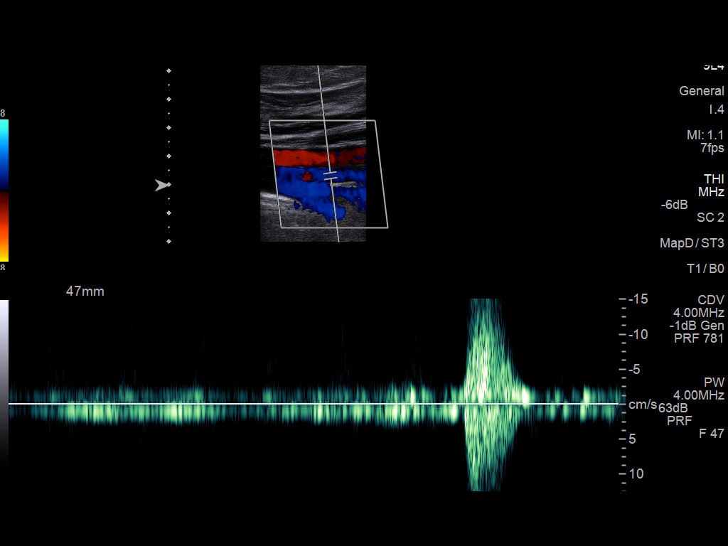
[im 15/57]
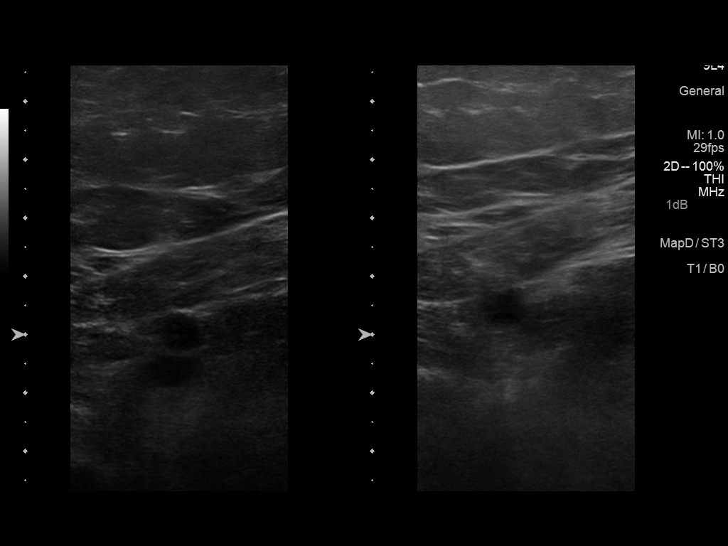
[im 20/57]
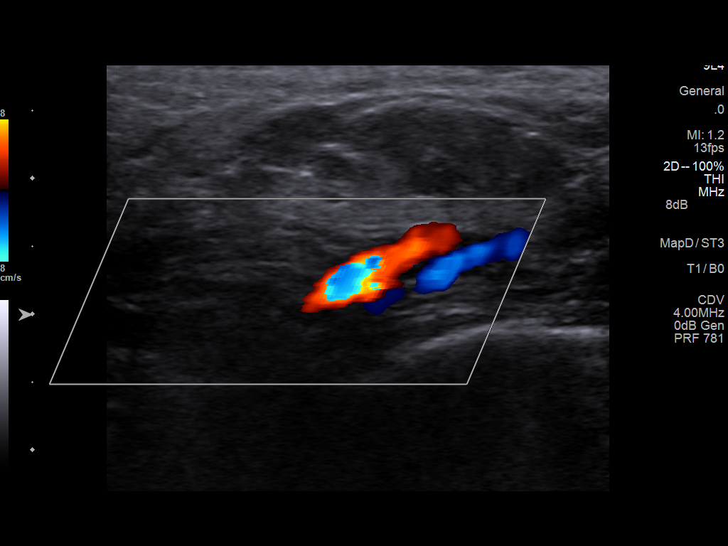
[im 25/57]
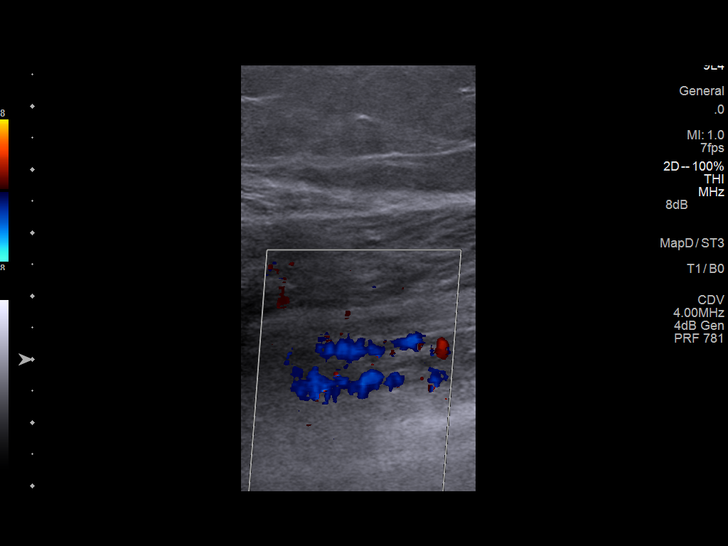
[im 30/57]
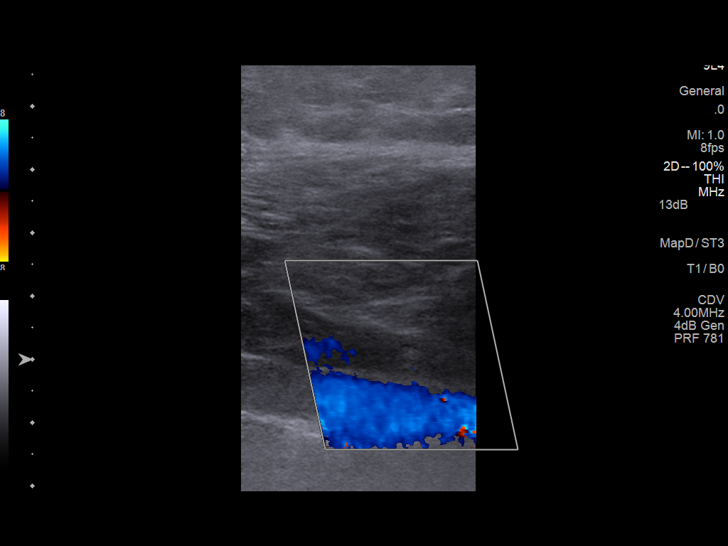
[im 32/57]
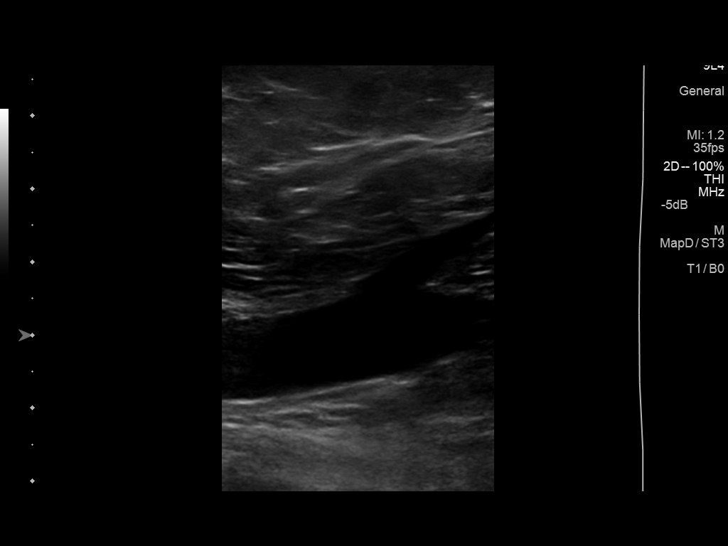
[im 37/57]
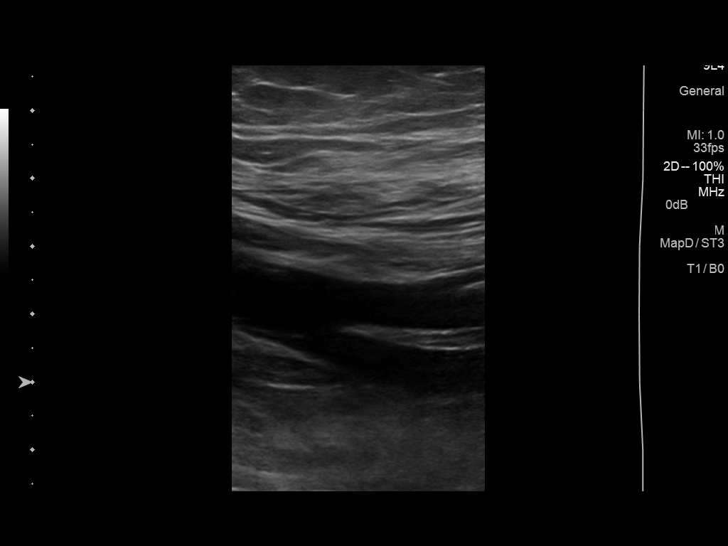
[im 42/57]
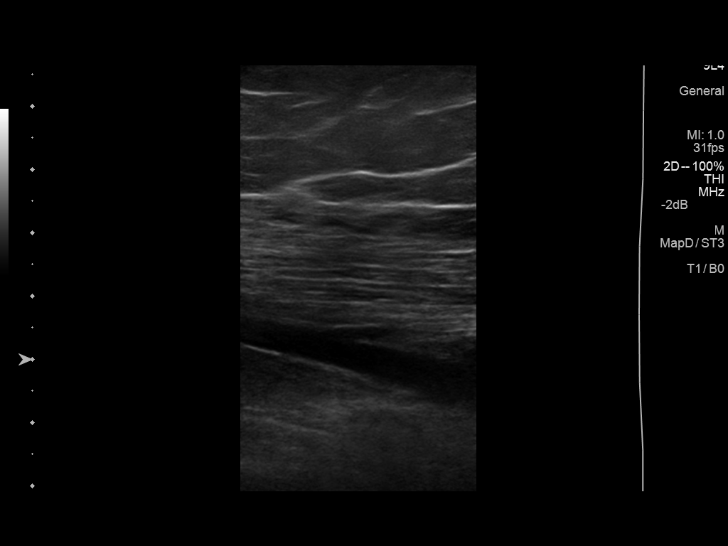
[im 47/57]
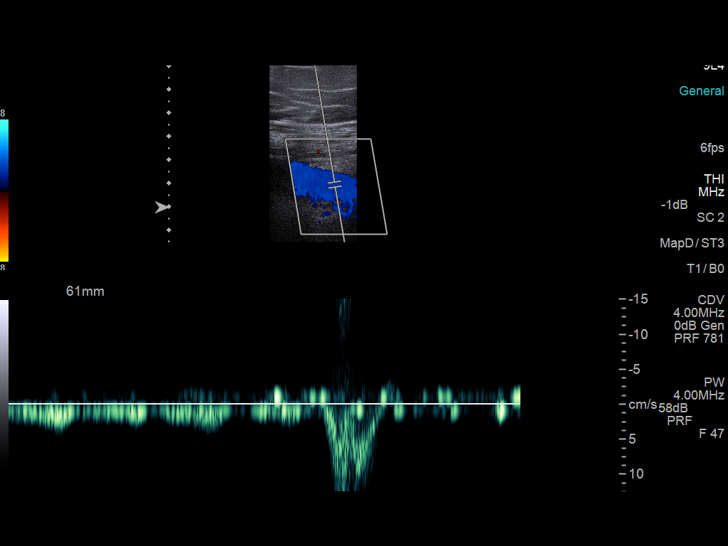
[im 52/57]
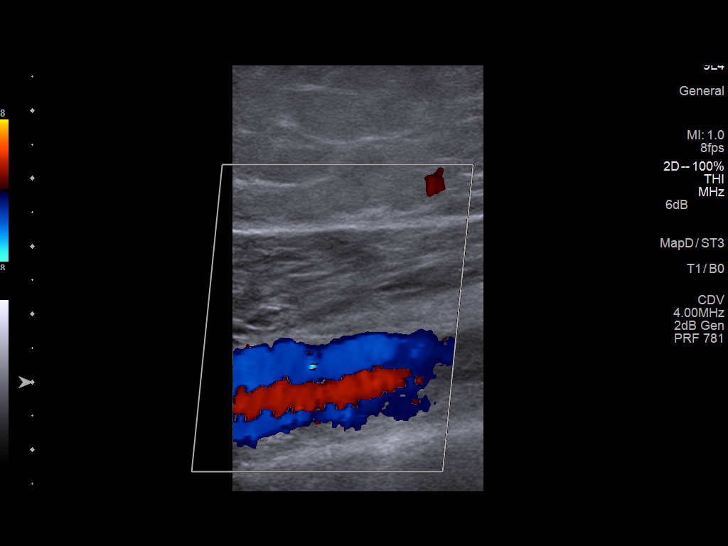
[im 57/57]
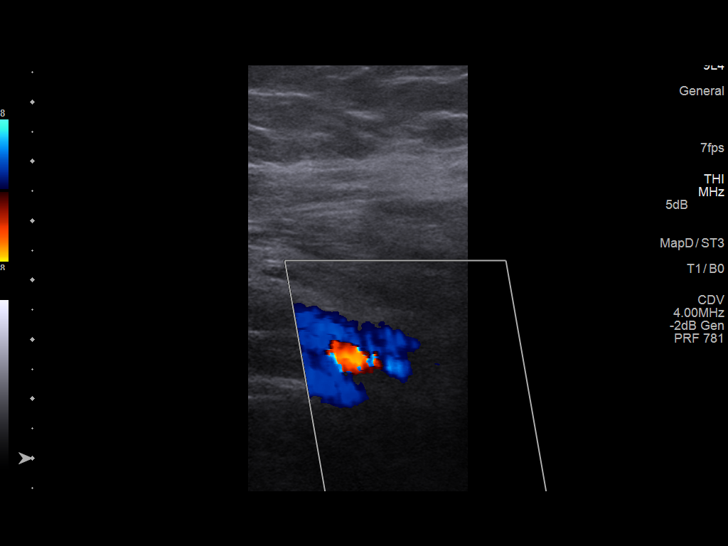

[13 of 24 positions shown; findings below may reference images not displayed]

FINDINGS: RIGHT LOWER EXTREMITY

Common Femoral Vein: No evidence of thrombus. Normal
compressibility, respiratory phasicity and response to augmentation.

Saphenofemoral Junction: No evidence of thrombus. Normal
compressibility and flow on color Doppler imaging.

Profunda Femoral Vein: No evidence of thrombus. Normal
compressibility and flow on color Doppler imaging.

Femoral Vein: No evidence of thrombus. Normal compressibility,
respiratory phasicity and response to augmentation.

Popliteal Vein: No evidence of thrombus. Normal compressibility,
respiratory phasicity and response to augmentation.

Calf Veins: No evidence of thrombus. Normal compressibility and flow
on color Doppler imaging.

Superficial Great Saphenous Vein: No evidence of thrombus. Normal
compressibility and flow on color Doppler imaging.

Venous Reflux:  None.

Other Findings:  None.

LEFT LOWER EXTREMITY

Common Femoral Vein: No evidence of thrombus. Normal
compressibility, respiratory phasicity and response to augmentation.

Saphenofemoral Junction: No evidence of thrombus. Normal
compressibility and flow on color Doppler imaging.

Profunda Femoral Vein: No evidence of thrombus. Normal
compressibility and flow on color Doppler imaging.

Femoral Vein: No evidence of thrombus. Normal compressibility,
respiratory phasicity and response to augmentation.

Popliteal Vein: No evidence of thrombus. Normal compressibility,
respiratory phasicity and response to augmentation.

Calf Veins: No evidence of thrombus. Normal compressibility and flow
on color Doppler imaging.

Superficial Great Saphenous Vein: No evidence of thrombus. Normal
compressibility and flow on color Doppler imaging.

Venous Reflux:  None.

Other Findings:  None.
IMPRESSION: No evidence of deep venous thrombosis.

## 2016-01-02 IMAGING — CR DG CHEST 2V
2 series · 2 of 2 positions shown · non-contrast
Comparison: 04/28/2014

CLINICAL DATA: Shortness of breath.  Asthma.  Tachycardia.

EXAM:
CHEST  2 VIEW

[view not recorded (1 of 2)]
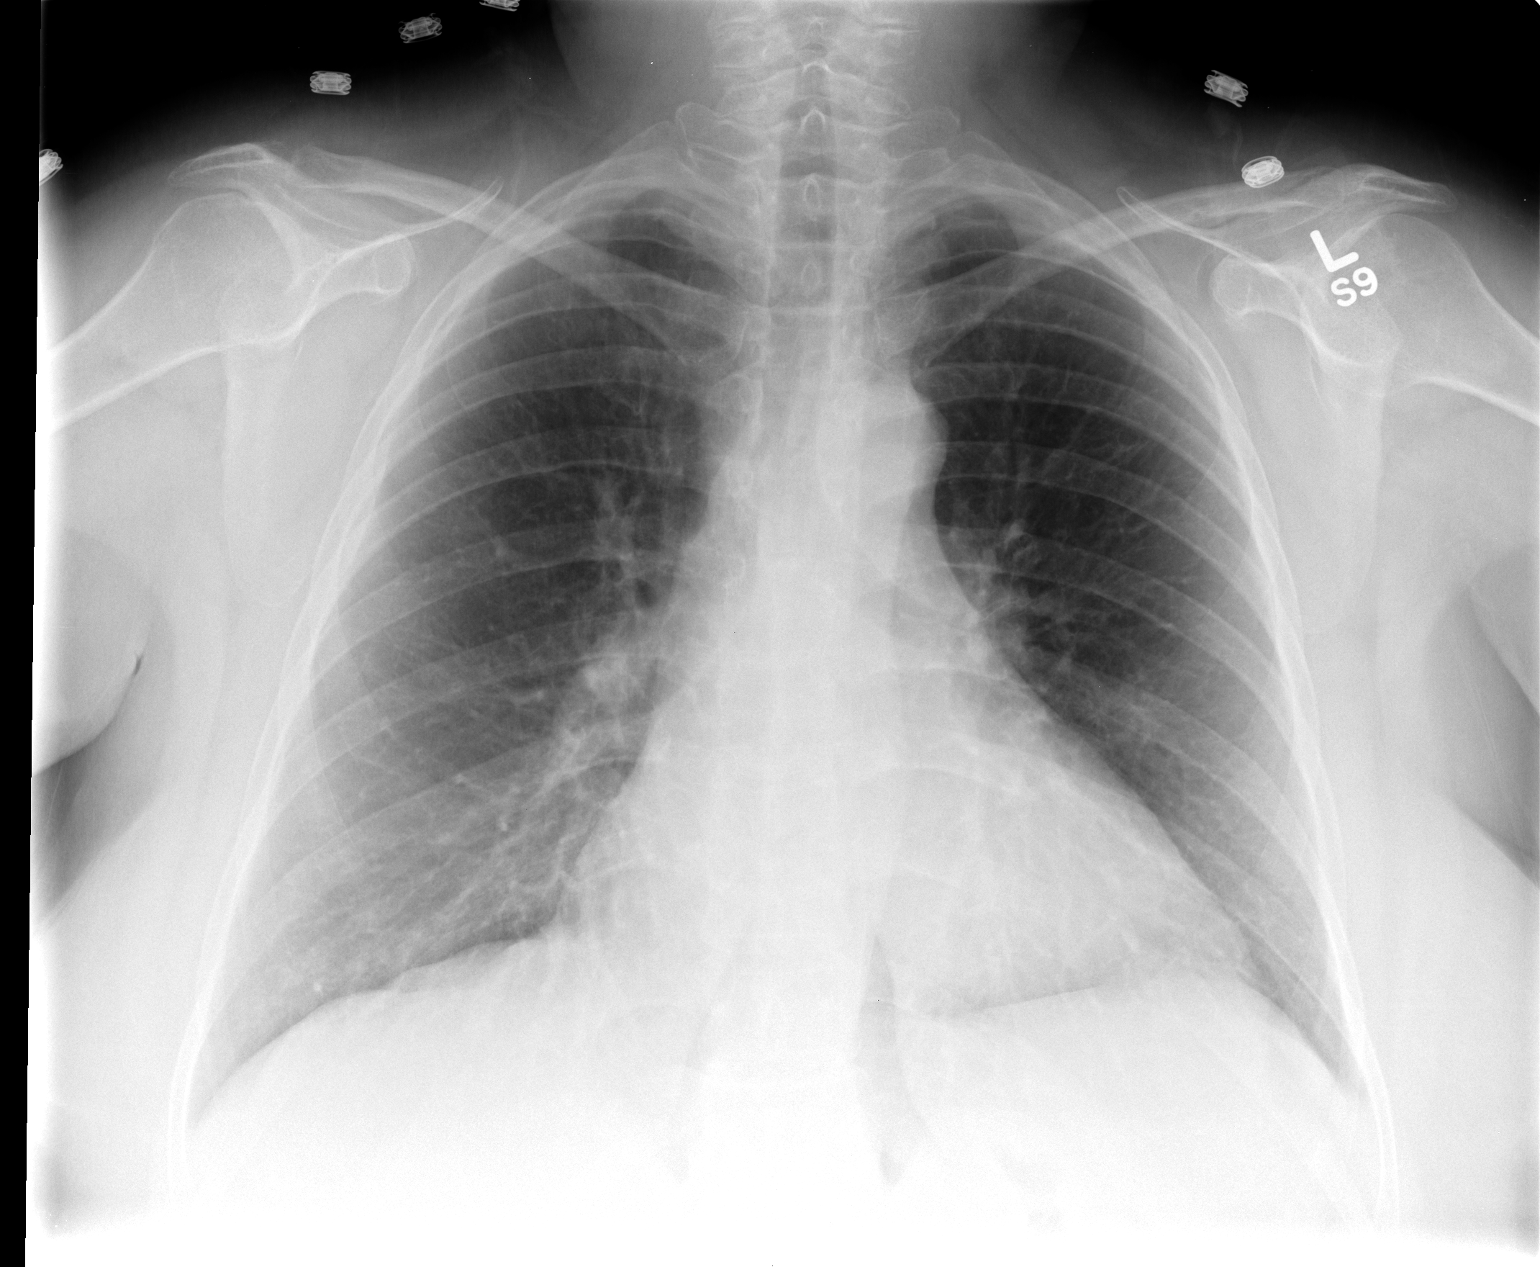

[view not recorded (2 of 2)]
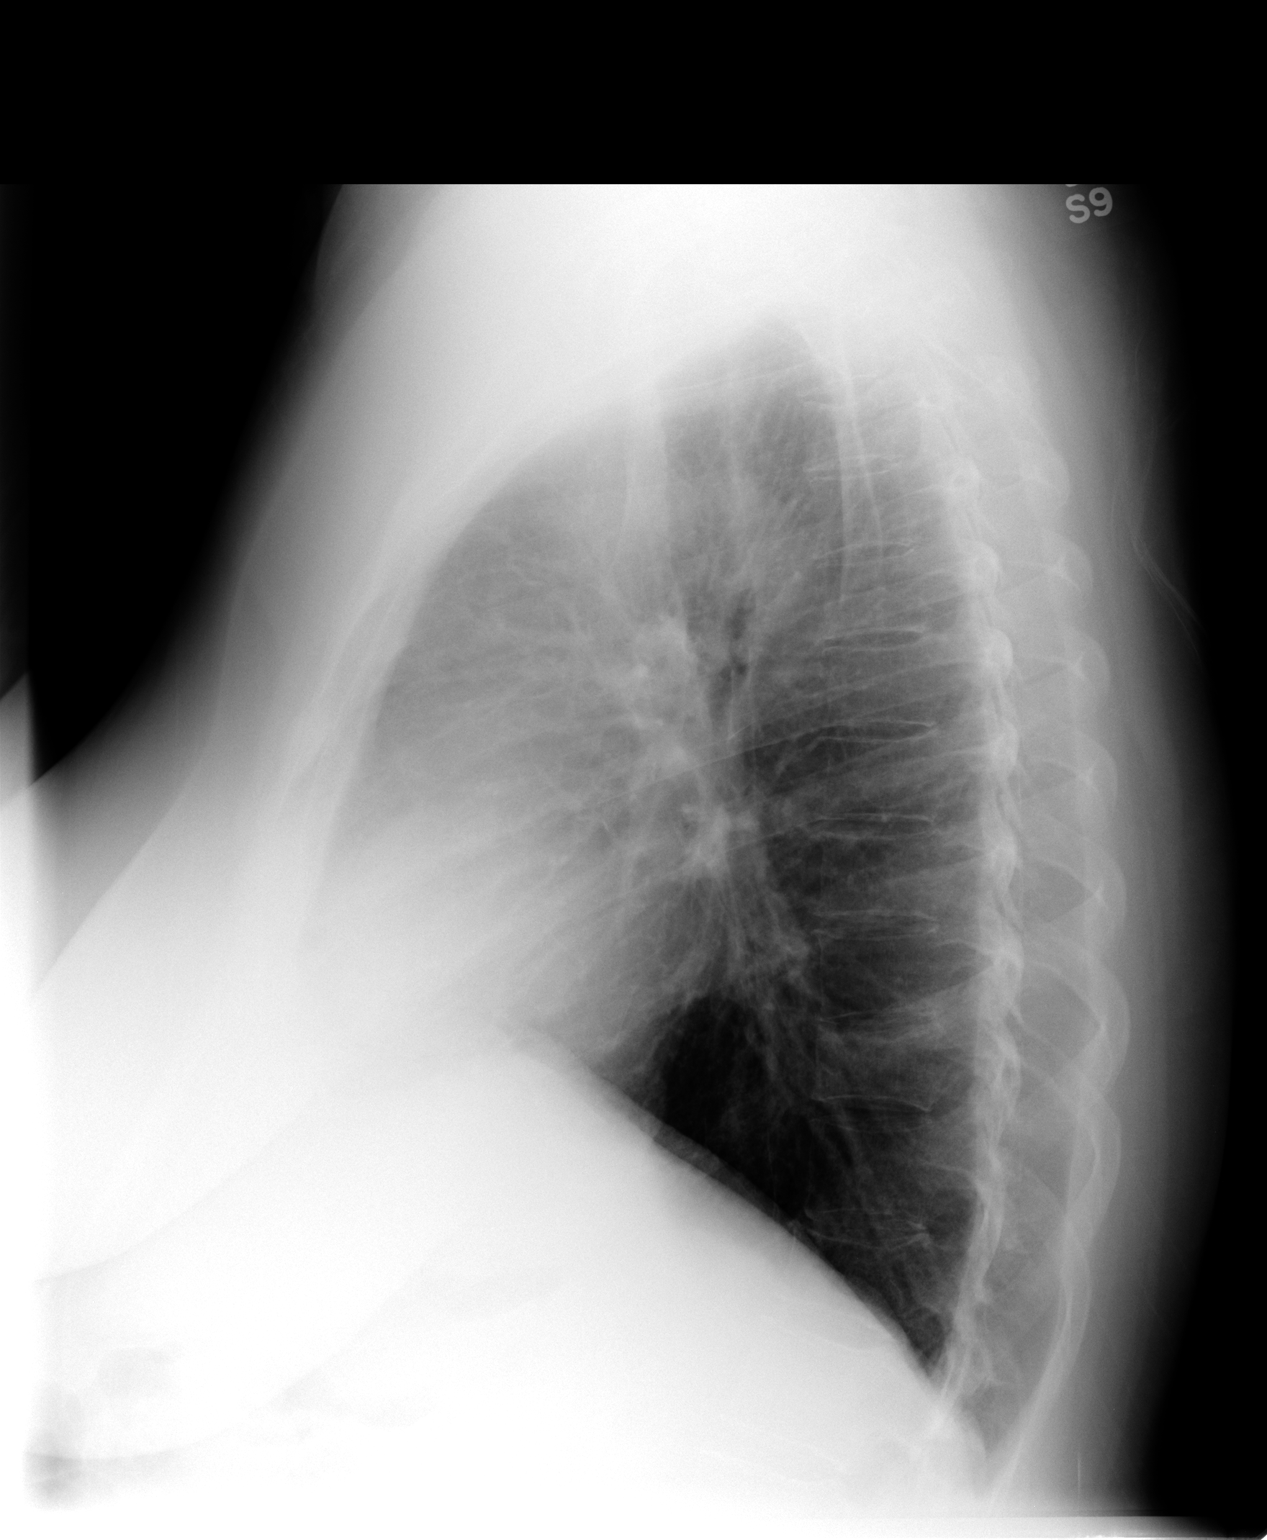

[2 of 2 positions shown; findings below may reference images not displayed]

FINDINGS: The heart size and mediastinal contours are within normal limits.
Both lungs are clear. The visualized skeletal structures are
unremarkable.
IMPRESSION: No active cardiopulmonary disease.

## 2016-01-03 ENCOUNTER — Ambulatory Visit (HOSPITAL_COMMUNITY): Payer: Medicare HMO | Admitting: Anesthesiology

## 2016-01-03 ENCOUNTER — Encounter (HOSPITAL_COMMUNITY)
Admission: RE | Disposition: A | Discharge status: Home / Self Care | Payer: Self-pay | Source: Ambulatory Visit | Attending: Cardiology

## 2016-01-03 ENCOUNTER — Ambulatory Visit (HOSPITAL_COMMUNITY): Payer: Medicare HMO

## 2016-01-03 ENCOUNTER — Telehealth: Payer: Self-pay

## 2016-01-03 ENCOUNTER — Encounter (HOSPITAL_COMMUNITY): Payer: Self-pay | Admitting: *Deleted

## 2016-01-03 ENCOUNTER — Ambulatory Visit (HOSPITAL_BASED_OUTPATIENT_CLINIC_OR_DEPARTMENT_OTHER): Payer: Medicare HMO

## 2016-01-03 ENCOUNTER — Ambulatory Visit (HOSPITAL_COMMUNITY)
Admission: RE | Admit: 2016-01-03 | Discharge: 2016-01-03 | Disposition: A | Discharge status: Home / Self Care | Payer: Medicare HMO | Source: Ambulatory Visit | Attending: Cardiology | Admitting: Cardiology

## 2016-01-03 DIAGNOSIS — F419 Anxiety disorder, unspecified: Secondary | ICD-10-CM | POA: Insufficient documentation

## 2016-01-03 DIAGNOSIS — E119 Type 2 diabetes mellitus without complications: Secondary | ICD-10-CM | POA: Insufficient documentation

## 2016-01-03 DIAGNOSIS — I63449 Cerebral infarction due to embolism of unspecified cerebellar artery: Secondary | ICD-10-CM

## 2016-01-03 DIAGNOSIS — R69 Illness, unspecified: Secondary | ICD-10-CM | POA: Diagnosis not present

## 2016-01-03 DIAGNOSIS — G8929 Other chronic pain: Secondary | ICD-10-CM | POA: Insufficient documentation

## 2016-01-03 DIAGNOSIS — I071 Rheumatic tricuspid insufficiency: Secondary | ICD-10-CM | POA: Diagnosis not present

## 2016-01-03 DIAGNOSIS — M549 Dorsalgia, unspecified: Secondary | ICD-10-CM | POA: Diagnosis not present

## 2016-01-03 DIAGNOSIS — Z6837 Body mass index (BMI) 37.0-37.9, adult: Secondary | ICD-10-CM | POA: Diagnosis not present

## 2016-01-03 DIAGNOSIS — Z87891 Personal history of nicotine dependence: Secondary | ICD-10-CM | POA: Insufficient documentation

## 2016-01-03 DIAGNOSIS — I340 Nonrheumatic mitral (valve) insufficiency: Secondary | ICD-10-CM

## 2016-01-03 DIAGNOSIS — D693 Immune thrombocytopenic purpura: Secondary | ICD-10-CM | POA: Diagnosis not present

## 2016-01-03 DIAGNOSIS — I639 Cerebral infarction, unspecified: Secondary | ICD-10-CM | POA: Insufficient documentation

## 2016-01-03 DIAGNOSIS — Z8673 Personal history of transient ischemic attack (TIA), and cerebral infarction without residual deficits: Secondary | ICD-10-CM | POA: Insufficient documentation

## 2016-01-03 DIAGNOSIS — N183 Chronic kidney disease, stage 3 (moderate): Secondary | ICD-10-CM | POA: Diagnosis not present

## 2016-01-03 HISTORY — PX: TEE WITHOUT CARDIOVERSION: SHX5443

## 2016-01-03 LAB — GLUCOSE, CAPILLARY: Glucose-Capillary: 76 mg/dL (ref 65–99)

## 2016-01-03 SURGERY — ECHOCARDIOGRAM, TRANSESOPHAGEAL
Anesthesia: Monitor Anesthesia Care

## 2016-01-03 MED ORDER — FENTANYL CITRATE (PF) 100 MCG/2ML IJ SOLN
INTRAMUSCULAR | Status: AC
  Filled 2016-01-03: qty 2

## 2016-01-03 MED ORDER — PROPOFOL 10 MG/ML IV BOLUS
INTRAVENOUS | Status: DC | PRN
  Administered 2016-01-03 (×2): 10 mg via INTRAVENOUS

## 2016-01-03 MED ORDER — ONDANSETRON HCL 4 MG/2ML IJ SOLN
INTRAMUSCULAR | Status: AC
  Filled 2016-01-03: qty 2

## 2016-01-03 MED ORDER — MIDAZOLAM HCL 2 MG/2ML IJ SOLN
1.0000 mg | INTRAMUSCULAR | Status: DC | PRN
  Administered 2016-01-03: 2 mg via INTRAVENOUS

## 2016-01-03 MED ORDER — LACTATED RINGERS IV SOLN
INTRAVENOUS | Status: DC
  Administered 2016-01-03: 1000 mL via INTRAVENOUS

## 2016-01-03 MED ORDER — PROPOFOL 500 MG/50ML IV EMUL
INTRAVENOUS | Status: DC | PRN
  Administered 2016-01-03: 75 ug/kg/min via INTRAVENOUS

## 2016-01-03 MED ORDER — ONDANSETRON HCL 4 MG/2ML IJ SOLN
4.0000 mg | Freq: Once | INTRAMUSCULAR | Status: AC
  Administered 2016-01-03: 4 mg via INTRAVENOUS

## 2016-01-03 MED ORDER — MIDAZOLAM HCL 2 MG/2ML IJ SOLN
INTRAMUSCULAR | Status: AC
  Filled 2016-01-03: qty 2

## 2016-01-03 MED ORDER — ONDANSETRON HCL 4 MG/2ML IJ SOLN: 4.0000 mg | Freq: Once | INTRAMUSCULAR | Status: DC | PRN

## 2016-01-03 MED ORDER — SODIUM CHLORIDE 0.9 % IV SOLN: INTRAVENOUS | Status: DC

## 2016-01-03 MED ORDER — FENTANYL CITRATE (PF) 100 MCG/2ML IJ SOLN
25.0000 ug | INTRAMUSCULAR | Status: AC
  Administered 2016-01-03 (×2): 25 ug via INTRAVENOUS

## 2016-01-03 MED ORDER — SODIUM CHLORIDE BACTERIOSTATIC 0.9 % IJ SOLN
INTRAMUSCULAR | Status: AC
  Filled 2016-01-03: qty 10

## 2016-01-03 MED ORDER — PROPOFOL 10 MG/ML IV BOLUS
INTRAVENOUS | Status: AC
  Filled 2016-01-03: qty 40

## 2016-01-03 MED ORDER — FENTANYL CITRATE (PF) 100 MCG/2ML IJ SOLN: 25.0000 ug | INTRAMUSCULAR | Status: DC | PRN

## 2016-01-03 NOTE — Anesthesia Preprocedure Evaluation (Signed)
Anesthesia Evaluation  Patient identified by MRN, date of birth, ID band Patient awake    Reviewed: Allergy & Precautions, NPO status , Patient's Chart, lab work & pertinent test results  Airway Mallampati: III  TM Distance: >3 FB   Mouth opening: Limited Mouth Opening  Dental  (+) Teeth Intact   Pulmonary asthma , former smoker,    breath sounds clear to auscultation       Cardiovascular hypertension, Pt. on medications  Rhythm:Regular Rate:Normal     Neuro/Psych PSYCHIATRIC DISORDERS Anxiety Depression CVA, Residual Symptoms    GI/Hepatic GERD  Controlled and Medicated,  Endo/Other  diabetes (steroid induced)Morbid obesity  Renal/GU      Musculoskeletal   Abdominal   Peds  Hematology   Anesthesia Other Findings   Reproductive/Obstetrics                             Anesthesia Physical Anesthesia Plan  ASA: III  Anesthesia Plan: MAC   Post-op Pain Management:    Induction: Intravenous  Airway Management Planned: Simple Face Mask  Additional Equipment:   Intra-op Plan:   Post-operative Plan:   Informed Consent: I have reviewed the patients History and Physical, chart, labs and discussed the procedure including the risks, benefits and alternatives for the proposed anesthesia with the patient or authorized representative who has indicated his/her understanding and acceptance.     Plan Discussed with:   Anesthesia Plan Comments:         Anesthesia Quick Evaluation

## 2016-01-03 NOTE — Telephone Encounter (Signed)
Ordered event monitor,spoke with daughter and gave her instructions to expect call from [place]

## 2016-01-03 NOTE — Addendum Note (Signed)
Addendum  created 01/03/16 1219 by Ollen Bowl, CRNA   Modules edited: Charges VN, Clinical Notes   Clinical Notes:  File: [id number]

## 2016-01-03 NOTE — Transfer of Care (Signed)
Immediate Anesthesia Transfer of Care Note  Patient: Summer Cook  Procedure(s) Performed: Procedure(s): TRANSESOPHAGEAL ECHOCARDIOGRAM (TEE) WITH PROPOFOL (N/A)  Patient Location: PACU  Anesthesia Type:MAC  Level of Consciousness: awake, alert  and oriented  Airway & Oxygen Therapy: Patient Spontanous Breathing and Patient connected to nasal cannula oxygen  Post-op Assessment: Report given to RN and Post -op Vital signs reviewed and stable  Post vital signs: Reviewed and stable  Last Vitals:  Filed Vitals:   01/03/16 1120 01/03/16 1125  BP: 131/89 89/68  Temp:    Resp: 29 14    Complications: No apparent anesthesia complications

## 2016-01-03 NOTE — CV Procedure (Signed)
Transesophageal echocardiogram  Please refer to complete report for details. Procedure was performed as an outpatient, sedation provided by the anesthesia team. Informed consent obtained. Transesophageal echocardiogram probe was easily inserted into the esophagus with bite block in place. Patient tolerated the procedure well without immediate complications. LVEF was normal range, there was no evidence of left or right atrial appendage thrombus, and no evidence of PFO or ASD based on color Doppler or agitated saline injection.  Satira Sark, M.D., F.A.C.C.

## 2016-01-03 NOTE — Anesthesia Postprocedure Evaluation (Addendum)
Anesthesia Post Note  Patient: Summer Cook  Procedure(s) Performed: Procedure(s) (LRB): TRANSESOPHAGEAL ECHOCARDIOGRAM (TEE) WITH PROPOFOL (N/A)  Patient location during evaluation: PACU Anesthesia Type: MAC Level of consciousness: awake and alert Pain management: pain level controlled Vital Signs Assessment: post-procedure vital signs reviewed and stable Respiratory status: spontaneous breathing Cardiovascular status: blood pressure returned to baseline Postop Assessment: no signs of nausea or vomiting Anesthetic complications: no    Last Vitals:  Filed Vitals:   01/03/16 1120 01/03/16 1125  BP: 131/89 89/68  Temp:    Resp: 29 14    Last Pain: There were no vitals filed for this visit.               Trey Gulbranson

## 2016-01-03 NOTE — Interval H&P Note (Signed)
History and Physical Interval Note:  01/03/2016 11:01 AM  Summer Cook  has presented today for surgery, with the diagnosis of CVA  The various methods of treatment have been discussed with the patient and family. After consideration of risks, benefits and other options for treatment, the patient has consented to  Procedure(s): TRANSESOPHAGEAL ECHOCARDIOGRAM (TEE) WITH PROPOFOL (N/A) as a surgical intervention .  The patient's history has been reviewed, patient examined, no change in status, stable for surgery.  I have reviewed the patient's chart and labs.  Questions were answered to the patient's satisfaction.     Rozann Lesches

## 2016-01-03 NOTE — H&P (View-Only) (Signed)
12/23/2015 Summer Cook  13-Feb-1966  [id number]  Primary Physician Hoyt Koch, MD Primary Cardiologist: Dr Domenic Polite  HPI: 50 y/o obese Caucasian female, currently unemployed former teachers aid, seen by Dr Domenic Polite in the past for chest pain. Echo in 2014 showed NL LVF with grade 1 DD. Myoview June 2015 was negative for ischemia. LOV was July 2015.  The pt has a history of tachycardia and is on chronic beta blocker. 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 She was admitted to [facility] 12/16/15 with numbness and weakness in her Rt arm and hand. She was admitted then signed out [place]. She is seen now in the office with her Aunt after her MRI 12/16/15 showed multifocal bilateral non hemorraghic stroke. Symptomatically she is improved though it took a couple of days. She denies any tachycardia or palpitations. There is no past history of PAF.    Current Outpatient Prescriptions  Medication Sig Dispense Refill  . aspirin-acetaminophen-caffeine (EXCEDRIN MIGRAINE) 250-250-65 MG tablet Take 2 tablets by mouth every 6 (six) hours as needed for headache.    . calcitonin, salmon, (MIACALCIN/FORTICAL) 200 UNIT/ACT nasal spray Place 1 spray into alternate nostrils daily.    . DULoxetine (CYMBALTA) 30 MG capsule Take 1 capsule (30 mg total) by mouth daily. 30 capsule 3  . furosemide (LASIX) 20 MG tablet Take 20-40 mg by mouth 2 (two) times daily as needed for fluid.   2  . ipratropium-albuterol (DUONEB) 0.5-2.5 (3) MG/3ML SOLN Take 3 mLs by nebulization 3 (three) times daily as needed (shorntess of breath/wheezing). 360 mL 0  . KLOR-CON 10 10 MEQ tablet Take 10 mEq by mouth as needed (Only takes with the furosemide).   2  . levothyroxine (SYNTHROID, LEVOTHROID) 50 MCG tablet Take 1 tablet (50 mcg total) by mouth daily. 90 tablet 3  . metoprolol succinate (TOPROL-XL) 50 MG 24 hr tablet Take 1 tablet (50 mg  total) by mouth daily. 30 tablet 6  . OVER THE COUNTER MEDICATION Place 1 drop into both eyes daily as needed (Dry Eyes).    Marland Kitchen PROAIR RESPICLICK 123XX123 (90 BASE) MCG/ACT AEPB Inhale 2 puffs into the lungs every 4 (four) hours as needed (Shortness of breath).   3  . promethazine (PHENERGAN) 25 MG tablet Take 1 tablet (25 mg total) by mouth every 8 (eight) hours as needed for nausea or vomiting. 20 tablet 0  . ALPRAZolam (XANAX) 1 MG tablet Take 1 mg by mouth 3 (three) times daily as needed for anxiety. Reported on 12/23/2015    . atorvastatin (LIPITOR) 40 MG tablet Take 1 tablet (40 mg total) by mouth daily. 90 tablet 3  . [DISCONTINUED] prochlorperazine (COMPAZINE) 10 MG tablet Take 10 mg by mouth as needed. For nausea      No current facility-administered medications for this visit.    Allergies  Allergen Reactions  . Brintellix [Vortioxetine] Itching  . [place] Venom Swelling  . Adhesive [Tape] Rash    Please use paper tape    Social History   Social History  . Marital Status: Divorced    Spouse Name: N/A  . Number of Children: 1  . Years of Education: N/A   Occupational History  .      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    Social History Main Topics  . Smoking status: Former Smoker -- 0 years    Types: Cigarettes    Quit date: 02/14/2009  . Smokeless tobacco: Never  Used  . Alcohol Use: No  . Drug Use: No  . Sexual Activity: No   Other Topics Concern  . Not on file   Social History Narrative     Review of Systems: General: negative for chills, fever, night sweats or weight changes.  Cardiovascular: negative for chest pain, dyspnea on exertion, edema, orthopnea, palpitations, paroxysmal nocturnal dyspnea or shortness of breath Dermatological: negative for rash Respiratory: negative for cough or wheezing Urologic: negative for hematuria Abdominal:  negative for nausea, vomiting, diarrhea, bright red blood per rectum, melena, or hematemesis Neurologic: negative for visual changes, syncope, or dizziness All other systems reviewed and are otherwise negative except as noted above.    Blood pressure 92/50, pulse 92, height 5\' 5"  (1.651 m), weight 224 lb (101.606 kg), last menstrual period 05/13/2011, SpO2 92 %.  General appearance: cooperative, no distress, morbidly obese and slowed mentation Neck: no carotid bruit and no JVD Lungs: clear to auscultation bilaterally Heart: regular rate and rhythm Abdomen: obese, non tender Extremities: no edema Pulses: 2+ and symmetric Skin: Skin color, texture, turgor normal. No rashes or lesions Neurologic: Grossly normal- mentyal status is not normal. She appears impaired. Her eyes are bloodshot. She is a little unsteady. Her voice is slightly slurred and she frequently lost her train of thought. 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  PLAN Reviewed with Dr Jacinta Shoe. She needs a TEE and loop recorder. No antiplatelet therapy for now with her history of ITP. Once her TEE is done she'll need to f/u with Dr Domenic Polite to review her options. She has an appointment with the neurologist tomorrow. I don't know her baseline mental status but she definitely appeared impaired today- ? Secondary to Xanax.   Erlene Quan PA-C 12/23/2015        [facility] has been requested to perform a transesophageal echocardiogram on Summer Cook 01/03/16 for  stroke.  After careful review of history and examination, the risks and benefits of transesophageal echocardiogram have been explained including risks of esophageal damage, perforation (1:10,000 risk), bleeding, pharyngeal hematoma as well as other potential complications associated with conscious sedation including aspiration, arrhythmia, respiratory failure and death. Alternatives to treatment were discussed, questions were answered. Patient is willing to proceed.   Erlene Quan,  12/25/2015 9:23 AM      Attending addendum 12/25/2015:  I have not seen this patient since 2015 and was not involved in her recent assessment. I reviewed the chart in detail. She was recently admitted to the hospital with symptoms concerning for stroke, although signed out AMA and was not actually seen formally by neurology (case discussed by phone). She did undergo a head MRI which was ultimately found to be abnormal showing areas of multifocal nonhemorrhagic infarct, and was then was seen in our office by Mr. Reino Bellis on March 27. Case was discussed with Dr. Bronson Ing with plan for her to wear a 30 day event monitor to exclude PAF, and also scheduling of a TEE. Dr. Bronson Ing elected to cancel the TEE for March 28 and reschedule  it with me on April 7. I see that this procedure is scheduled with anesthesia to manage sedation (she is on fairly high dose Xanax at home).   Satira Sark, M.D., F.A.C.C.

## 2016-01-07 ENCOUNTER — Ambulatory Visit (INDEPENDENT_AMBULATORY_CARE_PROVIDER_SITE_OTHER): Payer: Medicare HMO

## 2016-01-07 ENCOUNTER — Ambulatory Visit (INDEPENDENT_AMBULATORY_CARE_PROVIDER_SITE_OTHER): Payer: Medicare HMO | Admitting: Internal Medicine

## 2016-01-07 VITALS — HR 107 | Temp 97.6°F | Resp 20 | Wt 222.0 lb

## 2016-01-07 DIAGNOSIS — F411 Generalized anxiety disorder: Secondary | ICD-10-CM | POA: Diagnosis not present

## 2016-01-07 DIAGNOSIS — M549 Dorsalgia, unspecified: Secondary | ICD-10-CM | POA: Diagnosis not present

## 2016-01-07 DIAGNOSIS — I639 Cerebral infarction, unspecified: Secondary | ICD-10-CM

## 2016-01-07 DIAGNOSIS — F32A Depression, unspecified: Secondary | ICD-10-CM

## 2016-01-07 DIAGNOSIS — I499 Cardiac arrhythmia, unspecified: Secondary | ICD-10-CM | POA: Diagnosis not present

## 2016-01-07 DIAGNOSIS — D693 Immune thrombocytopenic purpura: Secondary | ICD-10-CM

## 2016-01-07 DIAGNOSIS — M5412 Radiculopathy, cervical region: Secondary | ICD-10-CM

## 2016-01-07 DIAGNOSIS — F329 Major depressive disorder, single episode, unspecified: Secondary | ICD-10-CM | POA: Diagnosis not present

## 2016-01-07 DIAGNOSIS — G464 Cerebellar stroke syndrome: Secondary | ICD-10-CM | POA: Diagnosis not present

## 2016-01-07 DIAGNOSIS — R69 Illness, unspecified: Secondary | ICD-10-CM | POA: Diagnosis not present

## 2016-01-07 DIAGNOSIS — G8929 Other chronic pain: Secondary | ICD-10-CM

## 2016-01-07 MED ORDER — ALPRAZOLAM 1 MG PO TABS
1.0000 mg | ORAL_TABLET | Freq: Three times a day (TID) | ORAL | Status: DC | PRN
Start: 2016-01-07 — End: 2016-03-16

## 2016-01-07 MED ORDER — DULOXETINE HCL 60 MG PO CPEP: 60.0000 mg | ORAL_CAPSULE | Freq: Every day | ORAL | Status: DC

## 2016-01-07 NOTE — Progress Notes (Signed)
Subjective:    Patient ID: Summer Cook, female    DOB: June 02, 1966, 50 y.o.   MRN: [id number]  HPI  Here to f/u as PCP not able, s/p CVA mar 18,with resulting right side residual weakness, had f/u appt with Dr Tomi Likens but canceled due to MD family emergency, resched for may 2.  States good compliance with statin, not on asa or other anticoag as was told by another MD with her ITP that asa was not compatible. Have appt with hematology may 25, Dr Whitney Muse. She is not amenable to anything before that.  Wants to go back on zoloft, as cymbalta since mar 22 has not seemed to help as much with depression, let alone pain.  Cries quite a bit more, more anxiuos and overwhelmed.  Has taken xanax yrs ago for same symptoms.  Recalls hydrocodone worked ok for pain before. Pt continues to have recurring LBP without change in severity, bowel or bladder change, fever, wt loss,  worsening LE pain/numbness/weakness, gait change or falls, but sometimes seems to have pain "from head to toe"  Has known hx of cervical spine disc dz and potential LUE c7 radiculopathy. C/o right > left upper back and shoulder pain.  Thumb and index finger go numb with rasing the left hand to shoulder level for even a few seconds. Has not recent lumbar imaging on file but recalls she was told by [facility] she had lumbar arthritis, has hx of also thoracic compression fx.  Asks for referral to pain clinic or at least ortho - prefers Dr Kristeen Mans in [place]. Past Medical History  Diagnosis Date  . Essential hypertension, benign   . 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  Last was Prednisone 78m; 2 wk in 10/2012.  She also was on Prednisone bridged with Cellcept briefly but stopped due to lack of insurance.   . Depression   . Thrush, oral 05/29/2011  . 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 [place] 04/[place]  .  Asthma   . Renal insufficiency   . Chronic chest pain   . Normal cardiac stress test 02/2014  . Gout   . Arthritis   . GERD (gastroesophageal reflux disease)   . Allergy    Past Surgical History  Procedure Laterality Date  . Cholecystectomy  2008  . Bone marrow biopsy    . 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 Colonoscopy with esophagogastroduodenoscopy (egd) N/A 01/04/2013    Dr. RGala Romney esophageal plaques with +KOH, hh. Gastric antrum abnormal, bx reactive gastropathy. Anal canal hemorrhoids, colonic tics, normal TI, single polyp (sessile serrated tubular adenoma). Next TCS 12/2017  . Cataract extraction      reports that she quit smoking about 6 years ago. Her smoking use included Cigarettes. She quit after 0 years of use. She has never used smokeless tobacco. She reports that she does not drink alcohol or use illicit drugs. family history includes AAA (abdominal aortic aneurysm) in her paternal grandmother; Cancer (age of onset: 629 in her paternal grandfather; Cancer (age of onset: 830 in her paternal grandmother; Cervical cancer in her mother; Lung cancer in her father; Pneumonia in her brother. Allergies  Allergen Reactions  . Brintellix [Vortioxetine] Itching  . Yellow Jacket Venom Swelling  . Adhesive [Tape] Rash    Please use paper tape   Current Outpatient Prescriptions on File Prior to Visit  Medication Sig Dispense Refill  . ALPRAZolam (  XANAX) 1 MG tablet Take 1 mg by mouth 3 (three) times daily as needed for anxiety. Reported on 12/23/2015    . aspirin-acetaminophen-caffeine (EXCEDRIN MIGRAINE) 250-250-65 MG tablet Take 2 tablets by mouth every 6 (six) hours as needed for headache.    Marland Kitchen atorvastatin (LIPITOR) 40 MG tablet Take 1 tablet (40 mg total) by mouth daily. 90 tablet 3  . calcitonin, salmon, (MIACALCIN/FORTICAL) 200 UNIT/ACT nasal spray Place 1 spray into alternate nostrils daily.    . DULoxetine (CYMBALTA) 30 MG capsule Take 1 capsule (30 mg total) by mouth daily. 30 capsule  3  . furosemide (LASIX) 20 MG tablet Take 20-40 mg by mouth 2 (two) times daily as needed for fluid.   2  . ipratropium-albuterol (DUONEB) 0.5-2.5 (3) MG/3ML SOLN Take 3 mLs by nebulization 3 (three) times daily as needed (shorntess of breath/wheezing). 360 mL 0  . KLOR-CON 10 10 MEQ tablet Take 10 mEq by mouth as needed (Only takes with the furosemide).   2  . levothyroxine (SYNTHROID, LEVOTHROID) 50 MCG tablet Take 1 tablet (50 mcg total) by mouth daily. 90 tablet 3  . metoprolol succinate (TOPROL-XL) 50 MG 24 hr tablet Take 1 tablet (50 mg total) by mouth daily. 30 tablet 6  . OVER THE COUNTER MEDICATION Place 1 drop into both eyes daily as needed (Dry Eyes).    Marland Kitchen PROAIR RESPICLICK 284 (90 BASE) MCG/ACT AEPB Inhale 2 puffs into the lungs every 4 (four) hours as needed (Shortness of breath).   3  . promethazine (PHENERGAN) 25 MG tablet Take 1 tablet (25 mg total) by mouth every 8 (eight) hours as needed for nausea or vomiting. 20 tablet 0  . [DISCONTINUED] prochlorperazine (COMPAZINE) 10 MG tablet Take 10 mg by mouth as needed. For nausea      No current facility-administered medications on file prior to visit.   Review of Systems  Constitutional: Negative for unusual diaphoresis or night sweats HENT: Negative for ear swelling or discharge Eyes: Negative for worsening visual haziness  Respiratory: Negative for choking and stridor.   Gastrointestinal: Negative for distension or worsening eructation Genitourinary: Negative for retention or change in urine volume.  Musculoskeletal: Negative for other MSK pain or swelling Skin: Negative for color change and worsening wound Neurological: Negative for tremors and numbness other than noted  Psychiatric/Behavioral: Negative for decreased concentration or agitation other than above       Objective:   Physical Exam Pulse 107  Temp(Src) 97.6 F (36.4 C) (Oral)  Resp 20  Wt 222 lb (100.699 kg)  SpO2 96%  LMP 05/13/2011 VS noted,    Constitutional: Pt appears in no apparent distress HENT: Head: NCAT.  Right Ear: External ear normal.  Left Ear: External ear normal.  Eyes: . Pupils are equal, round, and reactive to light. Conjunctivae and EOM are normal Neck: Normal range of motion. Neck supple.  Cardiovascular: Normal rate and regular rhythm.   Pulmonary/Chest: Effort normal and breath sounds without rales or wheezing.  Abd:  Soft, NT, ND, + BS Neurological: Pt is alert. Not confused , motor grossly intact Skin: Skin is warm. No rash, no LE edema Psychiatric: Pt behavior is normal. No agitation. + anxious/depressed affect Spine nontender    Assessment & Plan:

## 2016-01-07 NOTE — Patient Instructions (Signed)
OK to increase the cymbalta to 60 mg per day (remember it takes about 4 wks to start)  Please continue all other medications as before, and refills have been done if requested - the alprazolam  Please have the pharmacy call with any other refills you may need.  Please continue your efforts at being more active, low cholesterol diet, and weight control.  You are otherwise up to date with prevention measures today.  Please keep your appointments with your specialists as you may have planned - neurology, and hematology  You will be contacted regarding the referral for: Orthopedic  Please return in 4 weeks, or sooner if needed, to Dr Sharlet Salina

## 2016-01-07 NOTE — Progress Notes (Signed)
Pre visit review using our clinic review tool, if applicable. No additional management support is needed unless otherwise documented below in the visit note. 

## 2016-01-08 ENCOUNTER — Ambulatory Visit: Payer: Medicare Other | Admitting: Gastroenterology

## 2016-01-08 ENCOUNTER — Telehealth: Payer: Self-pay | Admitting: Internal Medicine

## 2016-01-08 ENCOUNTER — Encounter (HOSPITAL_COMMUNITY): Payer: Self-pay | Admitting: Cardiology

## 2016-01-08 DIAGNOSIS — M549 Dorsalgia, unspecified: Principal | ICD-10-CM

## 2016-01-08 DIAGNOSIS — G8929 Other chronic pain: Secondary | ICD-10-CM

## 2016-01-08 NOTE — Telephone Encounter (Signed)
Pt called stated she thought Dr. Jenny Reichmann was going to put in referral for pain clinic due to back and leg pain. Please advise, only see referral for orthopedic.

## 2016-01-09 ENCOUNTER — Encounter (HOSPITAL_COMMUNITY): Payer: Medicare HMO | Attending: Hematology & Oncology

## 2016-01-09 DIAGNOSIS — D693 Immune thrombocytopenic purpura: Secondary | ICD-10-CM | POA: Diagnosis not present

## 2016-01-09 LAB — CBC
HCT: 45 % (ref 36.0–46.0)
Hemoglobin: 13.8 g/dL (ref 12.0–15.0)
MCH: 25.8 pg — AB (ref 26.0–34.0)
MCHC: 30.7 g/dL (ref 30.0–36.0)
MCV: 84.1 fL (ref 78.0–100.0)
PLATELETS: 114 10*3/uL — AB (ref 150–400)
RBC: 5.35 MIL/uL — ABNORMAL HIGH (ref 3.87–5.11)
RDW: 15.2 % (ref 11.5–15.5)
WBC: 6.2 10*3/uL (ref 4.0–10.5)

## 2016-01-09 NOTE — Telephone Encounter (Signed)
This was an oversight - referral now done, hopefully to hear soon but sometimes can take more than 2 mo

## 2016-01-09 NOTE — Telephone Encounter (Signed)
Patient called to follow up. Advised that i would get this directly to you in corrine's absence  Also advised of the process with pain mgmt, and the timeframe to expect

## 2016-01-09 NOTE — Telephone Encounter (Signed)
Second attempt, line busy

## 2016-01-09 NOTE — Telephone Encounter (Signed)
Attempted to call. Got a busy signal

## 2016-01-11 NOTE — Assessment & Plan Note (Addendum)
Left sided, to f/u neurology as planned, also refer ortho for further consideration  Note:  Total time for pt hx, exam, review of record with pt in the room, determination of diagnoses and plan for further eval and tx is > 40 min, with over 50% spent in coordination and counseling of patient

## 2016-01-11 NOTE — Assessment & Plan Note (Signed)
A significant limiting issue with near panic, for xanax asd prn limited course only, not meant to be long term, to f/u any worsening symptoms or concerns

## 2016-01-11 NOTE — Assessment & Plan Note (Signed)
Chronic persistent, to increase the cymbalta 60 qd,  to f/u any worsening symptoms or concerns

## 2016-01-11 NOTE — Assessment & Plan Note (Signed)
Hopeful to improve with the increase in cymbalta,  to f/u any worsening symptoms or concerns

## 2016-01-11 NOTE — Assessment & Plan Note (Deleted)
Also for f/u heme with respect to consider ASA use

## 2016-01-11 NOTE — Assessment & Plan Note (Signed)
Also for f/u heme with respect to consider ASA use

## 2016-01-14 ENCOUNTER — Ambulatory Visit: Payer: Medicare HMO | Admitting: Neurology

## 2016-01-14 ENCOUNTER — Ambulatory Visit (HOSPITAL_COMMUNITY): Payer: Medicare Other | Admitting: Hematology & Oncology

## 2016-01-14 ENCOUNTER — Other Ambulatory Visit (HOSPITAL_COMMUNITY): Payer: Medicare Other

## 2016-01-15 ENCOUNTER — Ambulatory Visit (INDEPENDENT_AMBULATORY_CARE_PROVIDER_SITE_OTHER): Payer: Medicare HMO | Admitting: Cardiology

## 2016-01-15 ENCOUNTER — Encounter: Payer: Self-pay | Admitting: Cardiology

## 2016-01-15 VITALS — BP 120/60 | HR 91 | Ht 63.0 in | Wt 207.0 lb

## 2016-01-15 DIAGNOSIS — I10 Essential (primary) hypertension: Secondary | ICD-10-CM | POA: Diagnosis not present

## 2016-01-15 DIAGNOSIS — D693 Immune thrombocytopenic purpura: Secondary | ICD-10-CM

## 2016-01-15 DIAGNOSIS — M549 Dorsalgia, unspecified: Secondary | ICD-10-CM | POA: Diagnosis not present

## 2016-01-15 DIAGNOSIS — G8929 Other chronic pain: Secondary | ICD-10-CM

## 2016-01-15 DIAGNOSIS — Z8673 Personal history of transient ischemic attack (TIA), and cerebral infarction without residual deficits: Secondary | ICD-10-CM

## 2016-01-15 NOTE — Patient Instructions (Signed)
Your physician recommends that you schedule a follow-up appointment in: to be determined after monitor.   Thank you for choosing [facility] !

## 2016-01-15 NOTE — Progress Notes (Signed)
Cardiology Office Note  Date: 01/15/2016   ID: MARCELLINA JONSSON, DOB 05-02-1966, MRN [id number]  PCP: Hoyt Koch, MD  Primary Cardiologist: Rozann Lesches, MD   Chief Complaint  Patient presents with  . Follow-up TEE    History of Present Illness: Summer Cook is a 50 y.o. female last seen by Mr. Reino Bellis in March. She was seen at that time following a stroke for the assessment of potential cardioembolic source. TEE was scheduled with me and she was also intended to undergo cardiac monitoring to exclude atrial fibrillation.  TEE was performed without complication on April 7 revealing LVEF 55-60%, no LV mural thrombus, no left or right atrial appendage thrombus, and no PFO or ASD. Outpatient event recorder has been arranged. She is only worn it for a few days.  She is here today with her daughter. Daughter indicates that Ms. Oatley has had a general decline in the last few weeks. She saw Dr. Jenny Reichmann in [place] on April 11 - I reviewed the note. In the last week she had a fall, slipped on a blanket, has had back pain, urinary difficulties, also having some numbness in her left hand. Also sleeping a lot according to her daughter, has lost weight. Family has been trying to get her to go to the ER for evaluation. She is reportedly scheduled to see Dr. Tomi Likens for neurological evaluation on May 2.  Past Medical History  Diagnosis Date  . Essential hypertension, benign   . 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  Last was Prednisone 64m; 2 wk in 10/2012.  She also was on Prednisone bridged with Cellcept briefly but stopped due to lack of insurance.   . Depression   . Thrush, oral 05/29/2011  . Steroid-induced diabetes ([place]   . [place]ism ([place] 04/2012  . Asthma   . Renal insufficiency   . Chronic chest pain   . Normal cardiac stress test 02/2014  . Gout   .  Arthritis   . GERD (gastroesophageal reflux disease)   . Allergy     Past Surgical History  Procedure Laterality Date  . Cholecystectomy  2008  . Bone marrow biopsy    . 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 Colonoscopy with esophagogastroduodenoscopy (egd) N/A 01/04/2013    Dr. RGala Romney esophageal plaques with +KOH, hh. Gastric antrum abnormal, bx reactive gastropathy. Anal canal hemorrhoids, colonic tics, normal TI, single polyp (sessile serrated tubular adenoma). Next TCS 12/2017  . Cataract extraction    . Tee without cardioversion N/A 01/03/2016    Procedure: TRANSESOPHAGEAL ECHOCARDIOGRAM (TEE) WITH PROPOFOL;  Surgeon: SSatira Sark MD;  Location: [facility];  Service: Cardiovascular;  Laterality: N/A;    Current Outpatient Prescriptions  Medication Sig Dispense Refill  . ALPRAZolam (XANAX) 1 MG tablet Take 1 tablet (1 mg total) by mouth 3 (three) times daily as needed for anxiety. Reported on 12/23/2015 60 tablet 0  . atorvastatin (LIPITOR) 40 MG tablet Take 1 tablet (40 mg total) by mouth daily. 90 tablet 3  . DULoxetine (CYMBALTA) 60 MG capsule Take 1 capsule (60 mg total) by mouth daily. 30 capsule 11  . ipratropium-albuterol (DUONEB) 0.5-2.5 (3) MG/3ML SOLN Take 3 mLs by nebulization 3 (three) times daily as needed (shorntess of breath/wheezing). 360 mL 0  . levothyroxine (SYNTHROID, LEVOTHROID) 50 MCG tablet Take 1 tablet (50 mcg total) by mouth daily. 90 tablet 3  .  metoprolol succinate (TOPROL-XL) 50 MG 24 hr tablet Take 1 tablet (50 mg total) by mouth daily. 30 tablet 6  . OVER THE COUNTER MEDICATION Place 1 drop into both eyes daily as needed (Dry Eyes).    Marland Kitchen PROAIR RESPICLICK 604 (90 BASE) MCG/ACT AEPB Inhale 2 puffs into the lungs every 4 (four) hours as needed (Shortness of breath).   3  . promethazine (PHENERGAN) 25 MG tablet Take 1 tablet (25 mg total) by mouth every 8 (eight) hours as needed for nausea or vomiting. 20 tablet 0  . furosemide (LASIX) 20 MG tablet Take 20-40 mg  by mouth 2 (two) times daily as needed for fluid. Reported on 01/15/2016  2  . KLOR-CON 10 10 MEQ tablet Take 10 mEq by mouth as needed (Only takes with the furosemide). Reported on 01/15/2016  2  . [DISCONTINUED] prochlorperazine (COMPAZINE) 10 MG tablet Take 10 mg by mouth as needed. For nausea      No current facility-administered medications for this visit.   Allergies:  Brintellix; Yellow jacket venom; and Adhesive   Social History: The patient  reports that she quit smoking about 6 years ago. Her smoking use included Cigarettes. She quit after 0 years of use. She has never used smokeless tobacco. She reports that she does not drink alcohol or use illicit drugs.   ROS:  Please see the history of present illness. Otherwise, complete review of systems is positive for increased somnolence, weight loss, left hand numbness, neck pain.  All other systems are reviewed and negative.   Physical Exam: VS:  BP 120/60 mmHg  Pulse 91  Ht _0  (1.6 m)  Wt 207 lb (93.895 kg)  BMI 36.68 kg/m2  SpO2 94%  LMP 05/13/2011, BMI Body mass index is 36.68 kg/(m^2).  Wt Readings from Last 3 Encounters:  01/15/16 207 lb (93.895 kg)  01/07/16 222 lb (100.699 kg)  12/23/15 224 lb (101.606 kg)    Disheveled overweight woman, no distress.  HEENT: Conjunctiva and lids normal, oropharynx clear.  Neck: Supple, no obvious elevated JVP or carotid bruits, no thyromegaly.  Lungs: Clear to auscultation, nonlabored breathing at rest.  Cardiac: Regular rate and rhythm, no significant systolic murmur, no pericardial rub.  Abdomen: Soft, nontender, bowel sounds present, no guarding or rebound. Extremities: Chronic appearing mild edema, distal pulses 1-2+.  Skin: Mildly diaphoretic.  Musculoskeletal: No kyphosis.   ECG: I personally reviewed the prior tracing from 12/16/2015 which showed sinus rhythm with anterolateral and inferior T-wave inversions, low voltage.  Recent Labwork: 11/18/2015: TSH  1.74 12/16/2015: ALT 22; AST 38; BUN 35*; Creatinine, Ser 1.90*; Potassium 3.6; Sodium 138 01/09/2016: Hemoglobin 13.8; Platelets 114*     Component Value Date/Time   CHOL 186 12/17/2015 0400   TRIG 132 12/17/2015 0400   HDL 25* 12/17/2015 0400   CHOLHDL 7.4 12/17/2015 0400   VLDL 26 12/17/2015 0400   LDLCALC 135* 12/17/2015 0400    Other Studies Reviewed Today:  TEE 01/03/2016: Study Conclusions  - Left ventricle: Systolic function was normal. The estimated  ejection fraction was in the range of 55% to 60%. Wall motion was  normal; there were no regional wall motion abnormalities. - Descending aorta: The descending aorta had minor luminal  irregularities. - Mitral valve: There was mild regurgitation. - Left atrium: Trabeculation noted within appendage. No evidence of  thrombus in the atrial cavity or appendage. Emptying velocity was  normal. - Right atrium: No evidence of thrombus in the atrial cavity or  appendage. -  Atrial septum: No defect or patent foramen ovale was identified.  Agitated saline contrast study showed no right-to-left atrial  level shunt. - Tricuspid valve: There was trivial regurgitation.  Impressions:  - LVEF 55-60% without regional wall motion abnormalities or obvious  LV mural thrombus. No thrombus noted within the left or right  atrial appendages. No evidence of PFO or ASD by color flow  Doppler or agitated saline contrast. Mild mitral regurgitation.  Trivial tricuspid regurgitation. Minor luminal irregularities  within the descending aorta.  Assessment and Plan:  1. Patient status post stroke in late March, head MRI demonstrating multifocal areas of nonhemorrhagic infarct predominantly involving a left posterior frontal cortex and subcortical white matter. Also subacute right frontal cortex and subcortical white matter infarct noted. Patient left hospital AMA without neurological evaluation subsequently. She has pending visit to see  Dr. Tomi Likens on May 2. We were asked to do a TEE to assess for cardioembolic source. TEE was overall reassuring. She has an outpatient cardiac monitor now to assess for any potential atrial fibrillation.  2. Reported general decline in patient's functional capacity per discussion with daughter today. She has been more somnolent, has had some bladder incontinence, also left hand numbness. She had a fall recently as well with back pain. Not entirely clear whether this represents neurological decompensation or related to potential undiagnosed back injury. Daughter has been trying to encourage Ms. Vig to go to the ER for evaluation, and I would concur. She needs to have neuroimaging and further evaluation. Patient states that she is in agreement at this point.  3. Essential hypertension, blood pressure is well controlled today.  4. History of ITP, to be re-evaluated in hematology clinic at [facility].  Current medicines were reviewed with the patient today.  Disposition: Follow-up cardiac monitor results when available.   Signed, Satira Sark, MD, [facility] 01/15/2016 2:23 PM    [facility] [address] [place], [place] [address] Phone: [phone]; Fax: [phone]

## 2016-01-17 ENCOUNTER — Telehealth: Payer: Self-pay

## 2016-01-17 MED ORDER — BENZONATATE 200 MG PO CAPS: 200.0000 mg | ORAL_CAPSULE | Freq: Two times a day (BID) | ORAL | Status: DC | PRN

## 2016-01-17 NOTE — Telephone Encounter (Signed)
Sent in [place] for cough that she can take BID prn.

## 2016-01-17 NOTE — Telephone Encounter (Signed)
Patient would like to get something for a cough she is having. The medication she has been taking is not working. Her cough is making her cough up food. Please advise?

## 2016-01-23 ENCOUNTER — Encounter (HOSPITAL_COMMUNITY): Payer: Medicare HMO

## 2016-01-23 ENCOUNTER — Encounter: Payer: Self-pay | Admitting: Neurology

## 2016-01-23 ENCOUNTER — Ambulatory Visit (INDEPENDENT_AMBULATORY_CARE_PROVIDER_SITE_OTHER): Payer: Medicare HMO | Admitting: Neurology

## 2016-01-23 ENCOUNTER — Other Ambulatory Visit (HOSPITAL_COMMUNITY): Payer: Medicare Other

## 2016-01-23 VITALS — BP 110/80 | HR 99 | Ht 63.0 in | Wt 214.2 lb

## 2016-01-23 DIAGNOSIS — D693 Immune thrombocytopenic purpura: Secondary | ICD-10-CM | POA: Diagnosis not present

## 2016-01-23 DIAGNOSIS — I10 Essential (primary) hypertension: Secondary | ICD-10-CM

## 2016-01-23 DIAGNOSIS — R32 Unspecified urinary incontinence: Secondary | ICD-10-CM

## 2016-01-23 DIAGNOSIS — I639 Cerebral infarction, unspecified: Secondary | ICD-10-CM

## 2016-01-23 DIAGNOSIS — R69 Illness, unspecified: Secondary | ICD-10-CM | POA: Diagnosis not present

## 2016-01-23 DIAGNOSIS — F32A Depression, unspecified: Secondary | ICD-10-CM

## 2016-01-23 DIAGNOSIS — F329 Major depressive disorder, single episode, unspecified: Secondary | ICD-10-CM

## 2016-01-23 DIAGNOSIS — R208 Other disturbances of skin sensation: Secondary | ICD-10-CM

## 2016-01-23 DIAGNOSIS — R200 Anesthesia of skin: Secondary | ICD-10-CM

## 2016-01-23 LAB — CBC
HCT: 49 % — ABNORMAL HIGH (ref 36.0–46.0)
HEMOGLOBIN: 15.9 g/dL — AB (ref 12.0–15.0)
MCH: 26.7 pg (ref 26.0–34.0)
MCHC: 32.4 g/dL (ref 30.0–36.0)
MCV: 82.4 fL (ref 78.0–100.0)
Platelets: 129 10*3/uL — ABNORMAL LOW (ref 150–400)
RBC: 5.95 MIL/uL — AB (ref 3.87–5.11)
RDW: 15.1 % (ref 11.5–15.5)
WBC: 6 10*3/uL (ref 4.0–10.5)

## 2016-01-23 MED ORDER — ASPIRIN EC 81 MG PO TBEC: 81.0000 mg | DELAYED_RELEASE_TABLET | Freq: Every day | ORAL | Status: DC

## 2016-01-23 NOTE — Patient Instructions (Signed)
1.  Start aspirin EC 81mg  daily 2.  Continue Lipitor 40mg  daily.  Recheck fasting lipid panel in 3 months 3.  Go to [facility] [employer] (corner of [address]) for left wrist splint. Wear it at night and as much as possible during the day.  Will also order nerve study test of left arm. 3.  Check UA and culture 4.  Blood pressure control 5.  Will need to restart holter monitor/loop recorder 6.  Continue Zoloft 150mg  daily 7.  Will refer you for physical therapy/occupational therapy and speech or cognitive therapy 8.  Follow up in 6 months.

## 2016-01-23 NOTE — Progress Notes (Signed)
NEUROLOGY FOLLOW UP OFFICE NOTE  MAZELLA MUTHIG FW:[id number]  HISTORY OF PRESENT ILLNESS: Summer Cook is a 50 year old right-handed female with ITP, status post splenectomy, ITP, polycythemia, CKD stage 3, hypertension and depression who presents for stroke.  History obtained by patient, her aunt, her daughter, PCP note and hospital notes.  Labs, doppler report and imaging of brain CT/MRI/MRA reviewed.  She presented to [facility] on 12/16/15 for 2 day history of right hand and arm weakness.  She suddenly didn't feel well and dropped her cup.  She reported difficulty using a pen.  She also exhibited slowed speech, dizziness and chest pain.  CT of head showed no acute intracranial abnormality.  MRI of brain showed multifocal acute infarcts involving both cerebral hemispheres, affecting the left posterior frontal cortex and subcortical white matter, as well as a punctate infarct involving the right frontal region.  A chronic 7 x 12 mm hemorrhage was seen in the left cerebellar tonsil (as also demonstrated on prior MRI from 2014).  MRA showed no large vessel stenosis or occlusion.  Carotid doppler showed no hemodynamically significant stenosis.  Fasting lipid panel showed cholesterol 186, TG 132, HDL 25 and LDL 135.  Hgb A1c was 6.2.  She was admitted but immediately left against medical advice.  MRI of cervical spine was also performed, which showed central and rightward protrusion at C5-6 and central and leftward protrusion at C6-7.  She later had a TEE as an outpatient on 01/03/16, which showed EF 55-60% without source of embolus.  Continues to feel clumsy with her right hand. She also reports numbness and tingling in the first 3 digits of her left hand, with pain in the thumb.  She notices it mostly when she is holding a phone.  She still feels thinking and word-finding is slow.  She is depressed and cries easily.  She feels weak in the right leg.  She has chronic low back pain.  About 3 weeks ago, she  fell, landing on her back and buttocks.  Since then, she reports urinary incontinence and burning.  She denies urinary retention, bowel incontinence, or saddle anesthesia.  She was wearing a Holter but took it off.  She was started on Lipitor 40mg  daily.  Antiplatelet therapy was not started due to history of ITP.  PAST MEDICAL HISTORY: Past Medical History  Diagnosis Date  . Essential hypertension, benign   . 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  Last was Prednisone 40mg ; 2 wk in 10/2012.  She also was on Prednisone bridged with Cellcept briefly but stopped due to lack of insurance.   . Depression   . Thrush, oral 05/29/2011  . Steroid-induced diabetes ([place])   . Pulmonary embolism ([place]) 04/2012  . Asthma   . Renal insufficiency   . Chronic chest pain   . Normal cardiac stress test 02/2014  . Gout   . Arthritis   . GERD (gastroesophageal reflux disease)   . Allergy     MEDICATIONS: Current Outpatient Prescriptions on File Prior to Visit  Medication Sig Dispense Refill  . ALPRAZolam (XANAX) 1 MG tablet Take 1 tablet (1 mg total) by mouth 3 (three) times daily as needed for anxiety. Reported on 12/23/2015 60 tablet 0  . atorvastatin (LIPITOR) 40 MG tablet Take 1 tablet (40 mg total) by mouth daily. 90 tablet 3  . furosemide (LASIX) 20 MG tablet Take 20-40 mg by  mouth 2 (two) times daily as needed for fluid. Reported on 01/15/2016  2  . ipratropium-albuterol (DUONEB) 0.5-2.5 (3) MG/3ML SOLN Take 3 mLs by nebulization 3 (three) times daily as needed (shorntess of breath/wheezing). 360 mL 0  . KLOR-CON 10 10 MEQ tablet Take 10 mEq by mouth as needed (Only takes with the furosemide). Reported on 01/15/2016  2  . levothyroxine (SYNTHROID, LEVOTHROID) 50 MCG tablet Take 1 tablet (50 mcg total) by mouth daily. 90 tablet 3  . metoprolol succinate (TOPROL-XL) 50 MG 24 hr tablet Take 1 tablet  (50 mg total) by mouth daily. 30 tablet 6  . OVER THE COUNTER MEDICATION Place 1 drop into both eyes daily as needed (Dry Eyes).    Marland Kitchen PROAIR RESPICLICK [id number] (90 BASE) MCG/ACT AEPB Inhale 2 puffs into the lungs every 4 (four) hours as needed (Shortness of breath).   3  . promethazine (PHENERGAN) 25 MG tablet Take 1 tablet (25 mg total) by mouth every 8 (eight) hours as needed for nausea or vomiting. 20 tablet 0  . DULoxetine (CYMBALTA) 60 MG capsule Take 1 capsule (60 mg total) by mouth daily. (Patient not taking: Reported on 01/23/2016) 30 capsule 11  . [DISCONTINUED] prochlorperazine (COMPAZINE) 10 MG tablet Take 10 mg by mouth as needed. For nausea      No current facility-administered medications on file prior to visit.    ALLERGIES: Allergies  Allergen Reactions  . Brintellix [Vortioxetine] Itching  . Yellow Jacket Venom Swelling  . Adhesive [Tape] Rash    Please use paper tape    FAMILY HISTORY: Family History  Problem Relation Age of Onset  . Cervical cancer Mother   . Lung cancer Father   . Pneumonia Brother   . Cancer Paternal Grandmother 39    colon cancer, breast cancer  . Cancer Paternal Grandfather 55    colon cancer  . AAA (abdominal aortic aneurysm) Paternal Grandmother     SOCIAL HISTORY: Social History   Social History  . Marital Status: Divorced    Spouse Name: N/A  . Number of Children: 1  . Years of Education: N/A   Occupational History  .      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    Social History Main Topics  . Smoking status: Former Smoker -- 0 years    Types: Cigarettes    Quit date: 02/14/2009  . Smokeless tobacco: Never Used  . Alcohol Use: No  . Drug Use: No  . Sexual Activity: No   Other Topics Concern  . Not on file   Social History Narrative   Lives with her daughter and aunt in a one story home.  Has 1 daughter.  On disability.  Did work as a Optometrist and bus Geophysicist/field seismologist.  Education: some college.     REVIEW OF  SYSTEMS: Constitutional: No fevers, chills, or sweats, no generalized fatigue, change in appetite Eyes: No visual changes, double vision, eye pain Ear, nose and throat: No hearing loss, ear pain, nasal congestion, sore throat Cardiovascular: No chest pain, palpitations Respiratory:  No shortness of breath at rest or with exertion, wheezes GastrointestinaI: No nausea, vomiting, diarrhea, abdominal pain, fecal incontinence Genitourinary:  Dysuria, incontinence Musculoskeletal: back pain Integumentary: No rash, pruritus, skin lesions Neurological: as above Psychiatric: depression Endocrine: No palpitations, fatigue, diaphoresis, mood swings, change in appetite, change in weight, increased thirst Hematologic/Lymphatic:  No anemia, purpura, petechiae. Allergic/Immunologic: no itchy/runny eyes, nasal congestion, recent allergic reactions, rashes  PHYSICAL EXAM: Filed Vitals:  01/23/16 1313  BP: 110/80  Pulse: 99   General: No acute distress.  Tearful Head:  Normocephalic/atraumatic Eyes:  Fundi examined but not visualized Neck: supple, no paraspinal tenderness, full range of motion Heart:  Regular rate and rhythm Lungs:  Clear to auscultation bilaterally Back: No paraspinal tenderness Neurological Exam: alert and oriented to person, place, and time. Attention span and concentration intact, recent and remote memory intact, fund of knowledge intact.  Speech fluent and not dysarthric, language intact.  Right facial numbness, otherwise CN II-XII intact. Bulk and tone normal, right quad/ankle dorsiflexion 4/5, otherwise muscle strength 5/5 throughout.  Sensation to pinprick reduced on right and vibration intact.  Deep tendon reflexes 2+ throughout, toes downgoing.  Finger to nose and heel to shin testing intact.  Wide-based gait with right limp, Romberg negative.  Positive Tinel's on left wrist.  IMPRESSION: Acute bi- hemispheric stroke, likely cardioembolic Hypertension Left hand  numbness/pain, probable Carpal Tunnel however C7 radiculopathy possible based on MRI findings. Urinary incontinence/dysuria.  Suspect UTI Depression ITP  PLAN: 1.  I discussed with her hematologist, Dr. Whitney Muse, who said it would be okay to start ASA 81mg  daily.  Her platelet count has been doing well. 2.  Continue Lipitor 40mg  daily.  Recheck fasting lipid panel in 3 months 3.  Go to [facility] (corner of [address]) for left wrist splint. Wear it at night and as much as possible during the day.  Will also order nerve study test of left arm. 3.  Check UA and culture 4.  Blood pressure control 5.  Will need to restart holter monitor/loop recorder 6.  Continue Zoloft 150mg  daily 7.  Will refer you for physical therapy/occupational therapy and speech or cognitive therapy 8.  Follow up in 6 months.  Summer Clines, DO  CC: Pricilla Holm, MD

## 2016-01-24 ENCOUNTER — Other Ambulatory Visit: Payer: Self-pay

## 2016-01-24 MED ORDER — METOPROLOL SUCCINATE ER 50 MG PO TB24: 50.0000 mg | ORAL_TABLET | Freq: Every day | ORAL | Status: DC

## 2016-01-24 NOTE — Telephone Encounter (Signed)
Refill complete 

## 2016-01-27 ENCOUNTER — Ambulatory Visit: Payer: Medicare HMO | Admitting: Internal Medicine

## 2016-01-27 DIAGNOSIS — Z0289 Encounter for other administrative examinations: Secondary | ICD-10-CM

## 2016-01-28 ENCOUNTER — Ambulatory Visit: Payer: Medicare HMO | Admitting: Neurology

## 2016-01-30 ENCOUNTER — Telehealth: Payer: Self-pay | Admitting: Internal Medicine

## 2016-01-30 NOTE — Telephone Encounter (Signed)
Patient no showed for medication fu on 5/1.  Please advise.

## 2016-02-06 ENCOUNTER — Encounter (HOSPITAL_COMMUNITY): Payer: Medicare HMO | Attending: Hematology & Oncology

## 2016-02-06 ENCOUNTER — Other Ambulatory Visit (HOSPITAL_COMMUNITY): Payer: Self-pay | Admitting: Oncology

## 2016-02-06 ENCOUNTER — Telehealth: Payer: Self-pay | Admitting: Internal Medicine

## 2016-02-06 ENCOUNTER — Other Ambulatory Visit: Payer: Self-pay | Admitting: Internal Medicine

## 2016-02-06 DIAGNOSIS — D693 Immune thrombocytopenic purpura: Secondary | ICD-10-CM | POA: Diagnosis not present

## 2016-02-06 DIAGNOSIS — K625 Hemorrhage of anus and rectum: Secondary | ICD-10-CM

## 2016-02-06 LAB — CBC
HCT: 50.3 % — ABNORMAL HIGH (ref 36.0–46.0)
HEMOGLOBIN: 16.1 g/dL — AB (ref 12.0–15.0)
MCH: 26.5 pg (ref 26.0–34.0)
MCHC: 32 g/dL (ref 30.0–36.0)
MCV: 82.7 fL (ref 78.0–100.0)
PLATELETS: 99 10*3/uL — AB (ref 150–400)
RBC: 6.08 MIL/uL — AB (ref 3.87–5.11)
RDW: 15.9 % — ABNORMAL HIGH (ref 11.5–15.5)
WBC: 5.9 10*3/uL (ref 4.0–10.5)

## 2016-02-06 NOTE — Telephone Encounter (Signed)
Please advise 

## 2016-02-06 NOTE — Telephone Encounter (Signed)
Very sorry, but I am unable to accept at this time, due to number in panel at this time, thanks

## 2016-02-06 NOTE — Telephone Encounter (Signed)
Pt request to transfer from Dr. Sharlet Salina to Dr. Jenny Reichmann. Please advise.

## 2016-02-07 NOTE — Telephone Encounter (Signed)
Pt aware.

## 2016-02-11 ENCOUNTER — Ambulatory Visit (INDEPENDENT_AMBULATORY_CARE_PROVIDER_SITE_OTHER): Payer: Medicare HMO

## 2016-02-11 ENCOUNTER — Ambulatory Visit (INDEPENDENT_AMBULATORY_CARE_PROVIDER_SITE_OTHER): Payer: Medicare HMO | Admitting: Orthopedic Surgery

## 2016-02-11 ENCOUNTER — Telehealth: Payer: Self-pay

## 2016-02-11 ENCOUNTER — Telehealth: Payer: Self-pay | Admitting: *Deleted

## 2016-02-11 VITALS — BP 117/85 | HR 86 | Ht 64.5 in | Wt 202.4 lb

## 2016-02-11 DIAGNOSIS — M5441 Lumbago with sciatica, right side: Secondary | ICD-10-CM | POA: Diagnosis not present

## 2016-02-11 NOTE — Patient Instructions (Addendum)
Start physical therapy  physical therapy for 6 weeks then see Dr. Aline Brochure for reevaluation   Chronic Back Pain  When back pain lasts longer than 3 months, it is called chronic back pain.People with chronic back pain often go through certain periods that are more intense (flare-ups).  CAUSES Chronic back pain can be caused by wear and tear (degeneration) on different structures in your back. These structures include:  The bones of your spine (vertebrae) and the joints surrounding your spinal cord and nerve roots (facets).  The strong, fibrous tissues that connect your vertebrae (ligaments). Degeneration of these structures may result in pressure on your nerves. This can lead to constant pain. HOME CARE INSTRUCTIONS  Avoid bending, heavy lifting, prolonged sitting, and activities which make the problem worse.  Take brief periods of rest throughout the day to reduce your pain. Lying down or standing usually is better than sitting while you are resting.  Take over-the-counter or prescription medicines only as directed by your caregiver. SEEK IMMEDIATE MEDICAL CARE IF:   You have weakness or numbness in one of your legs or feet.  You have trouble controlling your bladder or bowels.  You have nausea, vomiting, abdominal pain, shortness of breath, or fainting.   This information is not intended to replace advice given to you by your health care provider. Make sure you discuss any questions you have with your health care provider.   Document Released: 10/22/2004 Document Revised: 12/07/2011 Document Reviewed: 03/04/2015 Elsevier Interactive Patient Education [employer].

## 2016-02-11 NOTE — Progress Notes (Signed)
Chief Complaint  Patient presents with  . Back Pain    with bilateral sciatica, had fall in late march and hurt back   HPI 50 year old female presents for evaluation of lower back pain and bilateral leg pain. Patient complains of constant 10 out of 10 sharp dull aching radiating pain associated with tingling in both of her legs. Her treatment to this point has only been hydrocodone 10 mg and Percocet 5 mg but no physical therapy. She has weakness and occasional giving way altered gait which may be related to recent stroke in March. The system review is below    ROS That includes weakness fatigue vision problems shortness of breath breathing issues wheezing irregular heartbeat loss of bladder control nausea blood in the stool weakness seasonal allergy joint pain limb pain muscle weakness and back pain vision problems denies sore throat polyphagia polydipsia depression or anxiety denies skin rash or open wound   Past Medical History  Diagnosis Date  . Essential hypertension, benign   . 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  Last was Prednisone '40mg'$ ; 2 wk in 10/2012.  She also was on Prednisone bridged with Cellcept briefly but stopped due to lack of insurance.   . Depression   . Thrush, oral 05/29/2011  . Steroid-induced diabetes ([place])   . Pulmonary embolism ([place]) 04/2012  . Asthma   . Renal insufficiency   . Chronic chest pain   . Normal cardiac stress test 02/2014  . Gout   . Arthritis   . GERD (gastroesophageal reflux disease)   . Allergy     Past Surgical History  Procedure Laterality Date  . Cholecystectomy  2008  . Bone marrow biopsy    . 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 Colonoscopy with esophagogastroduodenoscopy (egd) N/A 01/04/2013    Dr. Gala Romney: esophageal plaques with +KOH, hh. Gastric antrum abnormal, bx reactive gastropathy. Anal canal hemorrhoids, colonic tics,  normal TI, single polyp (sessile serrated tubular adenoma). Next TCS 12/2017  . Cataract extraction    . Tee without cardioversion N/A 01/03/2016    Procedure: TRANSESOPHAGEAL ECHOCARDIOGRAM (TEE) WITH PROPOFOL;  Surgeon: Satira Sark, MD;  Location: [facility];  Service: Cardiovascular;  Laterality: N/A;   Family History  Problem Relation Age of Onset  . Cervical cancer Mother   . Lung cancer Father   . Pneumonia Brother   . Cancer Paternal Grandmother 3    colon cancer, breast cancer  . Cancer Paternal Grandfather 89    colon cancer  . AAA (abdominal aortic aneurysm) Paternal Grandmother    Social History  Substance Use Topics  . Smoking status: Former Smoker -- 0 years    Types: Cigarettes    Quit date: 02/14/2009  . Smokeless tobacco: Never Used  . Alcohol Use: No    Current outpatient prescriptions:  .  ALPRAZolam (XANAX) 1 MG tablet, Take 1 tablet (1 mg total) by mouth 3 (three) times daily as needed for anxiety. Reported on 12/23/2015, Disp: 60 tablet, Rfl: 0 .  aspirin EC 81 MG tablet, Take 1 tablet (81 mg total) by mouth daily., Disp: 30 tablet, Rfl: 6 .  atorvastatin (LIPITOR) 40 MG tablet, Take 1 tablet (40 mg total) by mouth daily., Disp: 90 tablet, Rfl: 3 .  DULoxetine (CYMBALTA) 60 MG capsule, Take 1 capsule (60 mg total) by mouth daily., Disp: 30 capsule, Rfl: 11 .  furosemide (LASIX) 20 MG tablet, Take 20-40  mg by mouth 2 (two) times daily as needed for fluid. Reported on 01/15/2016, Disp: , Rfl: 2 .  ipratropium-albuterol (DUONEB) 0.5-2.5 (3) MG/3ML SOLN, Take 3 mLs by nebulization 3 (three) times daily as needed (shorntess of breath/wheezing)., Disp: 360 mL, Rfl: 0 .  KLOR-CON 10 10 MEQ tablet, Take 10 mEq by mouth as needed (Only takes with the furosemide). Reported on 01/15/2016, Disp: , Rfl: 2 .  levothyroxine (SYNTHROID, LEVOTHROID) 50 MCG tablet, Take 1 tablet (50 mcg total) by mouth daily., Disp: 90 tablet, Rfl: 3 .  metoprolol succinate (TOPROL-XL) 50 MG  24 hr tablet, Take 1 tablet (50 mg total) by mouth daily., Disp: 30 tablet, Rfl: 6 .  OVER THE COUNTER MEDICATION, Place 1 drop into both eyes daily as needed (Dry Eyes)., Disp: , Rfl:  .  PROAIR RESPICLICK 803 (90 BASE) MCG/ACT AEPB, Inhale 2 puffs into the lungs every 4 (four) hours as needed (Shortness of breath). , Disp: , Rfl: 3 .  promethazine (PHENERGAN) 25 MG tablet, Take 1 tablet (25 mg total) by mouth every 8 (eight) hours as needed for nausea or vomiting., Disp: 20 tablet, Rfl: 0 .  sertraline (ZOLOFT) 50 MG tablet, Take 150 mg by mouth daily., Disp: , Rfl:  .  [DISCONTINUED] prochlorperazine (COMPAZINE) 10 MG tablet, Take 10 mg by mouth as needed. For nausea , Disp: , Rfl:   BP 117/85 mmHg  Pulse 86  Ht 5' 4.5" (1.638 m)  Wt 202 lb 6.4 oz (91.808 kg)  BMI 34.22 kg/m2  LMP 05/13/2011  Physical Exam  Constitutional: She is oriented to person, place, and time. She appears well-developed and well-nourished. No distress.  Cardiovascular: Normal rate and intact distal pulses.   Neurological: She is alert and oriented to person, place, and time. She has normal reflexes. She exhibits normal muscle tone. Coordination normal.  Skin: Skin is warm and dry. No rash noted. She is not diaphoretic. No erythema. No pallor.  Psychiatric: She has a normal mood and affect. Her behavior is normal. Judgment and thought content normal.    Ortho Exam  The patient has altered gait with a staggering gait  She has normal strength in the right and left leg normal range of motion of the right and left hip and knee, stability of the knee and hip were tested normal, no tenderness in the hip and knee are ankle or leg of the right or left leg  She does have lumbar tenderness all levels. Left side lumbar tenderness is a little bit more than the right  The seated straight leg raise tests in the right and left leg were normal  Skin normal in both right and left leg. Sensory test normal right and left  leg.    ASSESSMENT: My personal interpretation of the images:  Lumbar spine films show  Facet arthritis L4-S1  PLAN Recommend physical therapy for 6 weeks then re-check

## 2016-02-11 NOTE — Telephone Encounter (Signed)
Please advise patient is requesting refill of xanax

## 2016-02-11 NOTE — Telephone Encounter (Signed)
Called patient with test results. No answer. Left message to call back.  

## 2016-02-11 NOTE — Telephone Encounter (Signed)
-----   Message from Satira Sark, MD sent at 02/10/2016  8:03 AM EDT ----- Monitor reviewed. No evidence of atrial fibrillation noted.

## 2016-02-11 NOTE — Telephone Encounter (Signed)
OK to fill this prescription with additional refills x0 Sch OV w/Dr Crawford Thank you!  

## 2016-02-14 NOTE — Telephone Encounter (Signed)
Needs visit as I have not rxed for her.

## 2016-02-18 ENCOUNTER — Ambulatory Visit (HOSPITAL_COMMUNITY): Payer: Medicare HMO | Attending: Neurology | Admitting: Speech Pathology

## 2016-02-18 ENCOUNTER — Encounter (HOSPITAL_COMMUNITY): Payer: Self-pay | Admitting: Speech Pathology

## 2016-02-18 DIAGNOSIS — R41841 Cognitive communication deficit: Secondary | ICD-10-CM | POA: Diagnosis not present

## 2016-02-18 NOTE — Therapy (Signed)
[place], [place], [address] Phone: [phone]   Fax:  [phone]  Speech Language Pathology Evaluation  Patient Details  Name: Summer Cook MRN: [id number] Date of Birth: May 14, 1966 Referring Provider: Dr. Tomi Likens  Encounter Date: 02/18/2016      End of Session - 02/18/16 1540    Visit Number 1   Number of Visits 1   Authorization Type [employer]   SLP Start Time 1884   SLP Stop Time  1610   SLP Time Calculation (min) 55 min   Activity Tolerance Patient tolerated treatment well      Past Medical History  Diagnosis Date  . Essential hypertension, benign   . 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  Last was Prednisone 58m; 2 wk in 10/2012.  She also was on Prednisone bridged with Cellcept briefly but stopped due to lack of insurance.   . Depression   . Thrush, oral 05/29/2011  . Steroid-induced diabetes ([place]   . Pulmonary embolism ([place] 04/2012  . Asthma   . Renal insufficiency   . Chronic chest pain   . Normal cardiac stress test 02/2014  . Gout   . Arthritis   . GERD (gastroesophageal reflux disease)   . Allergy     Past Surgical History  Procedure Laterality Date  . Cholecystectomy  2008  . Bone marrow biopsy    . 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 Colonoscopy with esophagogastroduodenoscopy (egd) N/A 01/04/2013    Dr. RGala Romney esophageal plaques with +KOH, hh. Gastric antrum abnormal, bx reactive gastropathy. Anal canal hemorrhoids, colonic tics, normal TI, single polyp (sessile serrated tubular adenoma). Next TCS 12/2017  . Cataract extraction    . Tee without cardioversion N/A 01/03/2016    Procedure: TRANSESOPHAGEAL ECHOCARDIOGRAM (TEE) WITH PROPOFOL;  Surgeon: SSatira Sark MD;  [facility];  Service: Cardiovascular;  Laterality: N/A;    There were no vitals filed for  this visit.      Subjective Assessment - 02/18/16 1942    Subjective "I was having trouble with my speech, but it is normal now."   Patient is accompained by: Family member  her mother   Currently in Pain? No/denies            SLP Evaluation [facility] - 02/18/16 1942    SLP Visit Information   SLP Received On 02/18/16   Referring Provider Dr. JTomi Likens  Onset Date 12/17/2015   Medical Diagnosis s/p CVA   General Information   HPI ABeautiful Pensylis a 50yo woman who was referred for SLP evaluation by Dr. JTomi Likensfollowing a stroke in March 2017. Pt was seen in hospital during that admission for a clinical swallow evaluation and left AMA.    Behavioral/Cognition Alert and cooperative   Mobility Status Ambulatory   Prior Functional Status   Cognitive/Linguistic Baseline Within functional limits   Type of Home House    Lives With Family;Daughter   Available Support Family   Education some college   Vocation On disability   Cognition   Overall Cognitive Status Within Functional Limits for tasks assessed; serial 7s with 100% acc with min delay x1   Memory Appears intact; 4 item recall after 10 minute delay= 12/12   Awareness Appears intact   Problem Solving Appears intact   Auditory Comprehension   Overall Auditory Comprehension Appears within functional limits for tasks  assessed   Yes/No Questions Within Functional Limits   Commands Within Functional Limits   Conversation Moderately complex   Visual Recognition/Discrimination   Discrimination Within Function Limits   Reading Comprehension   Reading Status Within funtional limits   Expression   Primary Mode of Expression Verbal   Verbal Expression   Overall Verbal Expression Appears within functional limits for tasks assessed   Initiation No impairment   Level of Generative/Spontaneous Verbalization Conversation   Repetition No impairment   Naming No impairment; generational naming 19 animals in 60 seconds   Pragmatics No  impairment   Non-Verbal Means of Communication Not applicable   Written Expression   Dominant Hand Right   Written Expression Within Functional Limits   Oral Motor/Sensory Function   Overall Oral Motor/Sensory Function Appears within functional limits for tasks assessed   Labial ROM Within Functional Limits   Labial Symmetry Within Functional Limits   Labial Strength Within Functional Limits   Labial Sensation Within Functional Limits   Labial Coordination WFL   Lingual ROM Within Functional Limits   Lingual Symmetry Within Functional Limits   Lingual Strength Within Functional Limits   Lingual Sensation Within Functional Limits   Lingual Coordination WFL   Facial ROM Within Functional Limits   Facial Symmetry Within Functional Limits   Facial Strength Within Functional Limits   Facial Sensation Within Functional Limits   Facial Coordination WFL   Velum Within Functional Limits   Mandible Within Functional Limits   Motor Speech   Overall Motor Speech Appears within functional limits for tasks assessed   Respiration Within functional limits   Phonation Normal   Resonance Within functional limits   Articulation Within functional limitis   Intelligibility Intelligible   Motor Planning [place] functional limits   Motor Speech Errors Not applicable   Phonation [facility]   Standardized Assessments   Standardized Assessments  [place] Naming Test-2nd edition   [place] Naming Test-2nd edition  20/20          SLP Education - Feb 28, 2016 1947    Education provided Yes   Education Details Recommended that pt use a calendar and/or planner to record appointments   Person(s) Educated Patient   Methods Explanation   Comprehension Verbalized understanding              Plan - 02-28-2016 1948    Clinical Impression Statement Pt reports return to baseline in terms of cognition, language, and speech skills. She endorses occasional difficulty remembering appointment times if not written down,  but states this occurred prior to her stroke as well. She reports that she initially had some "slurring" of her words, but that has resolved. She reports some weakness in her leg and tingling/numbness in her left hand. Oral motor examination was completed and found to be WNL. Informal cog ling measures administered and pt presents within normal limits. No further skilled SLP services indicated at this time.    Consulted and Agree with Plan of Care Patient      Patient will benefit from skilled therapeutic intervention in order to improve the following deficits and impairments:   Cognitive communication deficit      G-Codes - 02/28/2016 1951    Functional Assessment Tool Used clinical judgment   Functional Limitations Motor speech   Motor Speech Current Status [phone]) 0 percent impaired, limited or restricted   Motor Speech Goal Status (G9186) 0 percent impaired, limited or restricted   Motor Speech Goal Status (B3419) 0 percent impaired, limited or restricted  Problem List Patient Active Problem List   Diagnosis Date Noted  . C7 radiculopathy 01/07/2016  . History of stroke   . Chronic back pain 12/23/2015  . Cerebrovascular accident (CVA) due to embolism ([place]) 12/16/2015  . Back pain 11/22/2015  . Asthma, chronic 02/16/2015  . Anxiety state 02/16/2015  . CKD (chronic kidney disease) stage 3, GFR 30-59 ml/min 04/28/2014  . Nausea with vomiting 01/25/2013  . Abnormal LFTs 01/25/2013  . Orthostatic hypotension 01/25/2013  . Unspecified constipation 12/27/2012  . Rectal bleeding 12/26/2012  . PE (pulmonary embolism) 05/03/2012  . Tachycardia 06/04/2011  . Hemorrhoids 06/04/2011  . Steroid-induced diabetes ([place]) 06/04/2011  . 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 Immunosuppressed status ([place]) 06/04/2011  . Depression 06/04/2011  . Obesity 06/04/2011  . Hypertension 03/18/2011  . ITP (idiopathic thrombocytopenic purpura) 03/18/2011   Thank you,  Genene Churn,  [place]  [facility] 02/18/2016, 7:52 PM  [place] [address] [place], [place], [address] Phone: [phone]   Fax:  [phone]  Name: Summer Cook MRN: [id number] Date of Birth: 10-19-1965

## 2016-02-20 ENCOUNTER — Encounter (HOSPITAL_COMMUNITY): Payer: Self-pay | Admitting: Hematology & Oncology

## 2016-02-20 ENCOUNTER — Encounter (HOSPITAL_COMMUNITY): Payer: Medicare HMO

## 2016-02-20 ENCOUNTER — Other Ambulatory Visit (HOSPITAL_COMMUNITY): Payer: Self-pay | Admitting: Hematology & Oncology

## 2016-02-20 ENCOUNTER — Encounter (HOSPITAL_BASED_OUTPATIENT_CLINIC_OR_DEPARTMENT_OTHER): Payer: Medicare HMO | Admitting: Hematology & Oncology

## 2016-02-20 VITALS — BP 119/89 | HR 87 | Temp 98.2°F | Resp 18 | Wt 205.4 lb

## 2016-02-20 DIAGNOSIS — Z9081 Acquired absence of spleen: Secondary | ICD-10-CM

## 2016-02-20 DIAGNOSIS — D693 Immune thrombocytopenic purpura: Secondary | ICD-10-CM | POA: Diagnosis not present

## 2016-02-20 DIAGNOSIS — Z0000 Encounter for general adult medical examination without abnormal findings: Secondary | ICD-10-CM

## 2016-02-20 DIAGNOSIS — Z1231 Encounter for screening mammogram for malignant neoplasm of breast: Secondary | ICD-10-CM

## 2016-02-20 LAB — CBC
HCT: 47.7 % — ABNORMAL HIGH (ref 36.0–46.0)
Hemoglobin: 15.6 g/dL — ABNORMAL HIGH (ref 12.0–15.0)
MCH: 26.5 pg (ref 26.0–34.0)
MCHC: 32.7 g/dL (ref 30.0–36.0)
MCV: 81.1 fL (ref 78.0–100.0)
PLATELETS: 111 10*3/uL — AB (ref 150–400)
RBC: 5.88 MIL/uL — AB (ref 3.87–5.11)
RDW: 16.7 % — AB (ref 11.5–15.5)
WBC: 6 10*3/uL (ref 4.0–10.5)

## 2016-02-20 NOTE — Progress Notes (Signed)
Summer Koch, MD [address] [place] [address]    DIAGNOSIS:  ITP with platelet count of 18K on 12/2012 diagnosed in 2011   Splenectomy   History of iron deficiency   DEXA with normal bone density 05/2014   Acute bi- hemispheric stroke, likely cardioembolic  CURRENT THERAPY: Observation  INTERVAL HISTORY: Summer Cook 50 y.o. female returns for follow-up of ITP.   Her last mammogram was on 12/05/14.  She had blood work completed today.    She has been started on baby aspirin for a CVA. She notes that she had her stroke in March, and graduated from speech therapy so she doesn't need that anymore. She admits that she needed it at first, but then she went to speech therapy two days ago, and they said "you don't need it, but your aunt needs it, so I'm going to give it to her."  Summer Cook returns to the [facility] today unaccompanied.   She says nothing much is new. She notes she is feeling kind of weak but doesn't know why; she notes that she doesn't have a lot of energy. She confirms that this is a chronic issue.  She says she's eating some, and that she's lost some weight.   She can't remember when she had her last mammogram, and thinks she's probably due for one. She admits that the stroke has affected her memory to some extent.  During her physical exam, she comments that she did a heart study recently.  She denies bleeding or bruising.    MEDICAL HISTORY: Past Medical History  Diagnosis Date  . Essential hypertension, benign   . 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  Last was Prednisone '40mg'$ ; 2 wk in 10/2012.  She also was on Prednisone bridged with Cellcept briefly but stopped due to lack of insurance.   . Depression   . Thrush, oral 05/29/2011  . Steroid-induced diabetes ([place])   . Pulmonary embolism ([place]) 04/2012  . Asthma   .  Renal insufficiency   . Chronic chest pain   . Normal cardiac stress test 02/2014  . Gout   . Arthritis   . GERD (gastroesophageal reflux disease)   . Allergy     has Hypertension; ITP (idiopathic thrombocytopenic purpura); Tachycardia; Hemorrhoids; Steroid-induced diabetes ([place]); Post-splenectomy; Immunosuppressed status ([place]); Depression; Obesity; PE (pulmonary embolism); Rectal bleeding; Unspecified constipation; Nausea with vomiting; Abnormal LFTs; Orthostatic hypotension; CKD (chronic kidney disease) stage 3, GFR 30-59 ml/min; Asthma, chronic; Anxiety state; Back pain; Cerebrovascular accident (CVA) due to embolism ([place]); Chronic back pain; History of stroke; and C7 radiculopathy on her problem list.     is allergic to brintellix; yellow jacket venom; and adhesive.  Summer Cook had no medications administered during this visit.  SURGICAL HISTORY: Past Surgical History  Procedure Laterality Date  . Cholecystectomy  2008  . Bone marrow biopsy    . 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 Colonoscopy with esophagogastroduodenoscopy (egd) N/A 01/04/2013    Dr. Gala Romney: esophageal plaques with +KOH, hh. Gastric antrum abnormal, bx reactive gastropathy. Anal canal hemorrhoids, colonic tics, normal TI, single polyp (sessile serrated tubular adenoma). Next TCS 12/2017  . Cataract extraction    . Tee without cardioversion N/A 01/03/2016    Procedure: TRANSESOPHAGEAL ECHOCARDIOGRAM (TEE) WITH PROPOFOL;  Surgeon: Satira Sark, MD;  [facility];  Service: Cardiovascular;  Laterality: N/A;    SOCIAL  HISTORY: Social History   Social History  . Marital Status: Divorced    Spouse Name: N/A  . Number of Children: 1  . Years of Education: N/A   Occupational History  .      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    Social History Main Topics  . Smoking status: Former Smoker -- 0 years    Types: Cigarettes    Quit date: 02/14/2009  . Smokeless tobacco: Never Used  . Alcohol Use: No  . Drug Use: No    . Sexual Activity: No   Other Topics Concern  . Not on file   Social History Narrative   Lives with her daughter and aunt in a one story home.  Has 1 daughter.  On disability.  Did work as a Optometrist and bus Geophysicist/field seismologist.  Education: some college.     FAMILY HISTORY: Family History  Problem Relation Age of Onset  . Cervical cancer Mother   . Lung cancer Father   . Pneumonia Brother   . Cancer Paternal Grandmother 29    colon cancer, breast cancer  . Cancer Paternal Grandfather 90    colon cancer  . AAA (abdominal aortic aneurysm) Paternal Grandmother     Review of Systems  Constitutional: Negative.   HENT: Negative.   Eyes: Negative.   Respiratory: Negative.   Cardiovascular: Negative.   Gastrointestinal: Negative Musculoskeletal: Positive for joint pain.  Skin: Negative.   Neurological: per HPI, recent CVA   Endo/Heme/Allergies: Negative.   Psychiatric/Behavioral: Negative.   14 point review of systems was performed and is negative except as detailed under history of present illness and above   PHYSICAL EXAMINATION  ECOG PERFORMANCE STATUS: 1 - Symptomatic but completely ambulatory  Filed Vitals:   02/20/16 1320  BP: 119/89  Pulse: 87  Temp: 98.2 F (36.8 C)  Resp: 18    Physical Exam  Constitutional: She is oriented to person, place, and time and well-developed, well-nourished, and in no distress.  HENT:  Head: Normocephalic and atraumatic.  Nose: Nose normal.  Mouth/Throat: Oropharynx is clear and moist. No oropharyngeal exudate.  Eyes: Conjunctivae and EOM are normal. Pupils are equal, round, and reactive to light. Right eye exhibits no discharge. Left eye exhibits no discharge. No scleral icterus.  Neck: Normal range of motion. Neck supple. No tracheal deviation present. No thyromegaly present.  Cardiovascular: Normal rate, regular rhythm and normal heart sounds.  Exam reveals no gallop and no friction rub.   No murmur heard. Pulmonary/Chest:  Effort normal and breath sounds normal. She has no wheezes. She has no rales.  Abdominal: Soft. Bowel sounds are normal. She exhibits no distension and no mass. There is no tenderness. There is no rebound and no guarding.  Musculoskeletal: Normal range of motion. She exhibits no edema.  Lymphadenopathy:    She has no cervical adenopathy.  Neurological: She is alert and oriented to person, place, and time.  No cranial nerve deficit. Gait normal. Coordination normal.  Skin: Skin is warm and dry. No rash noted.  Psychiatric: Mood, memory, affect and judgment normal.  Nursing note and vitals reviewed.   LABORATORY DATA: I have reviewed the data below as listed.  Results for AMONIE, WISSER (MRN [id number]) as of 02/20/2016 13:49  Ref. Range 02/20/2016 12:45  WBC Latest Ref Range: 4.0-10.5 K/uL 6.0  RBC Latest Ref Range: 3.87-5.11 MIL/uL 5.88 (H)  Hemoglobin Latest Ref Range: 12.0-15.0 g/dL 15.6 (H)  HCT Latest Ref Range: 36.0-46.0 % 47.7 (H)  MCV Latest Ref Range: 78.0-100.0 fL 81.1  MCH Latest Ref Range: 26.0-34.0 pg 26.5  MCHC Latest Ref Range: 30.0-36.0 g/dL 32.7  RDW Latest Ref Range: 11.5-15.5 % 16.7 (H)  Platelets Latest Ref Range: 150-400 K/uL 111 (L)   Results for TNIYA, BOWDITCH (MRN [id number]) as of 02/20/2016 20:45  Ref. Range 11/28/2015 13:30 12/12/2015 13:26 12/16/2015 13:51 12/26/2015 12:08 01/09/2016 12:28 01/23/2016 10:59 02/06/2016 11:33 02/20/2016 12:45  Platelets Latest Ref Range: 150-400 K/uL 131 (L) 119 (L) 105 (L) 136 (L) 114 (L) 129 (L) 99 (L) 111 (L)    ASSESSMENT and THERAPY PLAN:   ITP  50 year old female with ITP diagnosed in 2011. She failed splenectomy. She is very responsive to steroids but quickly relapsed after discontinuing them. She has been off of all steroids for 3 months. Counts have been excellent. I feel at this point we can check a cbc in 6 weeks. She will return in 3 months. She is to continue on her aspirin as prescribed by her neurologist.  I reassured  her today that if needed newer agents in ITP would be much safer for her to use long term ie. promacta than ongoing steroids.   Screening  She is overdue for her screening mammogram. Order is placed.   This document serves as a record of services personally performed by Ancil Linsey, MD. It was created on her behalf by Toni Amend, a trained medical scribe. The creation of this record is based on the scribe's personal observations and the provider's statements to them. This document has been checked and approved by the attending provider.  I have reviewed the above documentation for accuracy and completeness, and I agree with the above.  This note was electronically signed.   [place] Fam. Whitney Muse, MD

## 2016-02-20 NOTE — Patient Instructions (Signed)
[place] at [facility] Discharge Instructions  RECOMMENDATIONS MADE BY THE CONSULTANT AND ANY TEST RESULTS WILL BE SENT TO YOUR REFERRING PHYSICIAN.  Mammogram is due now! You need to go ahead and get this done.  Return in 3 months with labs and to see Gershon Mussel  Thank you for choosing [place] at [facility] to provide your oncology and hematology care.  To afford each patient quality time with our provider, please arrive at least 15 minutes before your scheduled appointment time.   Beginning January 23rd 2017 lab work for the [facility] will be done in the  Main lab at [facility] on 1st floor. If you have a lab appointment with the [facility] please come in thru the  Main Entrance and check in at the main information desk  You need to re-schedule your appointment should you arrive 10 or more minutes late.  We strive to give you quality time with our providers, and arriving late affects you and other patients whose appointments are after yours.  Also, if you no show three or more times for appointments you may be dismissed from the clinic at the providers discretion.     Again, thank you for choosing [facility].  Our hope is that these requests will decrease the amount of time that you wait before being seen by our physicians.       _____________________________________________________________  Should you have questions after your visit to [facility], please contact our office at [phone] between the hours of 8:30 a.m. and 4:30 p.m.  Voicemails left after 4:30 p.m. will not be returned until the following business day.  For prescription refill requests, have your pharmacy contact our office.         Resources For Cancer Patients and their Caregivers ? American Cancer Society: Can assist with transportation, wigs, general needs, runs Look Good Feel Better.        725-627-3835 ? Cancer Care: Provides  financial assistance, online support groups, medication/co-pay assistance.  1-800-813-HOPE 724 151 2723) ? [facility] Assists [place] cancer patients and their families through emotional , educational and financial support.  (715)429-9004 ? [place] DSS Where to apply for food stamps, Medicaid and utility assistance. 629-764-4188 ? [facility]: Transportation to medical appointments. 430-721-4515 ? Social Security Administration: May apply for disability if have a Stage IV cancer. (770) 125-5810 6305667831 ? [employer], Disability and Transit Services: Assists with nutrition, care and transit needs. [place] Support Programs: @10RELATIVEDAYS @ > Cancer Support Group  2nd Tuesday of the month 1pm-2pm, Journey Room  > Creative Journey  3rd Tuesday of the month 1130am-1pm, Journey Room  > Look Good Feel Better  1st Wednesday of the month 10am-12 noon, Journey Room (Call [place] to register [phone])

## 2016-02-26 ENCOUNTER — Ambulatory Visit (HOSPITAL_COMMUNITY)
Admission: RE | Admit: 2016-02-26 | Discharge: 2016-02-26 | Disposition: A | Discharge status: Home / Self Care | Payer: Medicare HMO | Source: Ambulatory Visit | Attending: Hematology & Oncology | Admitting: Hematology & Oncology

## 2016-02-26 DIAGNOSIS — Z1231 Encounter for screening mammogram for malignant neoplasm of breast: Secondary | ICD-10-CM | POA: Insufficient documentation

## 2016-03-10 ENCOUNTER — Ambulatory Visit (HOSPITAL_COMMUNITY): Payer: Medicare HMO | Attending: Neurology | Admitting: Physical Therapy

## 2016-03-10 ENCOUNTER — Encounter (HOSPITAL_COMMUNITY): Payer: Self-pay | Admitting: Physical Therapy

## 2016-03-10 DIAGNOSIS — R2689 Other abnormalities of gait and mobility: Secondary | ICD-10-CM | POA: Diagnosis not present

## 2016-03-10 DIAGNOSIS — R29898 Other symptoms and signs involving the musculoskeletal system: Secondary | ICD-10-CM | POA: Diagnosis not present

## 2016-03-10 DIAGNOSIS — M6281 Muscle weakness (generalized): Secondary | ICD-10-CM

## 2016-03-10 DIAGNOSIS — G8929 Other chronic pain: Secondary | ICD-10-CM

## 2016-03-10 DIAGNOSIS — M545 Low back pain: Secondary | ICD-10-CM | POA: Insufficient documentation

## 2016-03-10 NOTE — Therapy (Addendum)
[place], [place], [address] Phone: [phone]   Fax:  [phone]  Physical Therapy Evaluation/Discharge  Patient Details  Name: Summer Cook MRN: [id number] Date of Birth: [birth year] Referring Provider: Metta Clines, DO  Encounter Date: 03/10/2016      PT End of Session - 03/10/16 1528    Visit Number 1   Number of Visits 12   Date for PT Re-Evaluation 04/03/16   Authorization Type [employer] medicare   Authorization Time Period 03/10/16 to 04/24/16   PT Start Time 1448  Pt arrived late and filling out paperwork   PT Stop Time 1521   PT Time Calculation (min) 33 min   Activity Tolerance Patient tolerated treatment well   Behavior During Therapy [facility] for tasks assessed/performed      Past Medical History  Diagnosis Date  . Essential hypertension, benign   . 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  Last was Prednisone '40mg'$ ; 2 wk in 10/2012.  She also was on Prednisone bridged with Cellcept briefly but stopped due to lack of insurance.   . Depression   . Thrush, oral 05/29/2011  . Steroid-induced diabetes ([place])   . Pulmonary embolism ([place]) 04/2012  . Asthma   . Renal insufficiency   . Chronic chest pain   . Normal cardiac stress test 02/2014  . Gout   . Arthritis   . GERD (gastroesophageal reflux disease)   . Allergy     Past Surgical History  Procedure Laterality Date  . Cholecystectomy  2008  . Bone marrow biopsy    . 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 Colonoscopy with esophagogastroduodenoscopy (egd) N/A 01/04/2013    Dr. Gala Romney: esophageal plaques with +KOH, hh. Gastric antrum abnormal, bx reactive gastropathy. Anal canal hemorrhoids, colonic tics, normal TI, single polyp (sessile serrated tubular adenoma). Next TCS 12/2017  . Cataract extraction    . Tee without cardioversion N/A 01/03/2016   Procedure: TRANSESOPHAGEAL ECHOCARDIOGRAM (TEE) WITH PROPOFOL;  Surgeon: Satira Sark, MD;  Location: [facility];  Service: Cardiovascular;  Laterality: N/A;    There were no vitals filed for this visit.       Subjective Assessment - 03/10/16 1452    Subjective Pt states she has had back pain which started ~3years ago. It came out of no where. She was having a CT scan or MRI or something done to image her spine after jumping up and noticing pain. They found a compression fracture as well as arthritis. She saw Dr. Aline Brochure ~1 month ago who confirmed she does has arthritis and sent her to PT. She was getting hydrocodone from her previous PCP, but now that she has switched, she isn't getting it and has noticed her pain is back.    Pertinent History Asthma, gout, arthritis, Chronic chest pain    Limitations Sitting;Walking   How long can you sit comfortably? 1 hour   How long can you stand comfortably? 5-10 minutes   How long can you walk comfortably? 5-10 minutes (fatigue)   Patient Stated Goals fix the pain   Currently in Pain? Yes   Pain Score 3   worst: 10/10, best: 0/10 (sleeping)   Pain Location Back   Pain Orientation Lower;Mid   Pain Descriptors / Indicators Aching   Pain Type Chronic pain   Pain Radiating Towards aching into her legs down to her ankles with standing for  a while; this alleviates after sitting for ~54mn.   Pain Onset More than a month ago   Pain Frequency Constant   Aggravating Factors  standing, walking    Pain Relieving Factors "gel"   Effect of Pain on Daily Activities some impact on ADLs   Multiple Pain Sites No            [facility] PT Assessment - 03/10/16 0001    Assessment   Medical Diagnosis LBP   Referring Provider AMetta Clines DO   Onset Date/Surgical Date --  3 years ago   Next MD Visit 1 month   Prior Therapy None   Precautions   Precautions None   Restrictions   Weight Bearing Restrictions No   Balance Screen   Has the patient  fallen in the past 6 months Yes   How many times? 1  walking and missed the step down into the next room   Has the patient had a decrease in activity level because of a fear of falling?  No   Is the patient reluctant to leave their home because of a fear of falling?  No   Prior Function   Level of Independence Independent   Cognition   Overall Cognitive Status Within Functional Limits for tasks assessed   Observation/Other Assessments   Focus on Therapeutic Outcomes (FOTO)  54% limited   Sensation   Light Touch Appears Intact   Posture/Postural Control   Posture/Postural Control Postural limitations   Postural Limitations Rounded Shoulders;Forward head;Decreased lumbar lordosis   ROM / Strength   AROM / PROM / Strength AROM;Strength   AROM   AROM Assessment Site Lumbar   Lumbar Flexion touches toes, pain coming up in low back   Lumbar Extension WNL, pain at end range   Lumbar - Right Side Bend 2 in above jt line, pain free   Lumbar - Left Side Bend 5 in above jt line, pain on Rt low back   Lumbar - Right Rotation WNL, pain free   Lumbar - Left Rotation WNL, Pain in spine   Strength   Strength Assessment Site Hip;Knee;Ankle   Right/Left Hip Right;Left   Right Hip Flexion 4+/5   Right Hip Extension 4/5   Right Hip ABduction 4+/5   Left Hip Flexion 4+/5   Left Hip Extension 4/5   Left Hip ABduction 4+/5   Right/Left Knee Right;Left   Right Knee Flexion 5/5   Right Knee Extension 5/5   Left Knee Flexion 5/5   Left Knee Extension 5/5   Right/Left Ankle Right;Left   Right Ankle Dorsiflexion 5/5   Left Ankle Dorsiflexion 5/5   Flexibility   Soft Tissue Assessment /Muscle Length yes   Hamstrings WNL   Quadriceps WNL   Piriformis WNL   Quadratus Lumborum R>L limitations noted with AROM   Palpation   Spinal mobility tenderness noted with L4-S1 accessory testing   Palpation comment TTP along piriformis and QL (R>L)   Special Tests    Special Tests Lumbar   Lumbar Tests  Slump Test;Prone Knee Bend Test;Straight Leg Raise   Slump test   Findings Negative   Prone Knee Bend Test   Findings Negative   Straight Leg Raise   Findings Negative   Transfers   Comments Noting pt demonstrating sit up to transition supine to sit    Ambulation/Gait   Gait Comments No significant deviations noted  PT Education - 03/10/16 1707    Education provided Yes   Education Details discussed characteristics of back pain; importance of preventing fear avoidant behavior; eval findings/POC   Person(s) Educated Patient   Methods Explanation   Comprehension Verbalized understanding          PT Short Term Goals - 03/10/16 1700    PT SHORT TERM GOAL #1   Title Pt will demo consistency and independence with HEP   Time 2   Period Weeks   Status New   PT SHORT TERM GOAL #2   Title Pt will demo improved max pain of no greater than 5/10 on VAS   Time 3   Period Weeks   Status New   PT SHORT TERM GOAL #3   Title Pt will demo improved BLE strength to 5/5 MMT to assist with functional mobility   Time 3   Period Weeks   Status New   PT SHORT TERM GOAL #4   Title Pt will demo correct log roll technique throughout her session without cues from therapist.    Time 2   Period Weeks   Status New           PT Long Term Goals - 03/10/16 1701    PT LONG TERM GOAL #1   Title Pt will report max pain no greater than 3/10 to improve her tolerance to acitivty.   Time 6   Period Weeks   Status New   PT LONG TERM GOAL #2   Title Pt will demo improved mobility of the lumbar spine evident by pain free active ROM.   Time 6   Period Weeks   Status New   PT LONG TERM GOAL #3   Title Pt will demo upright posture throughout atleast 50% of her session and without cues from PT to decrease strain on her back musculature   Time 6   Period Weeks   Status New   PT LONG TERM GOAL #4   Title Pt will demo correct lifting mechanics evident by  her ability to lift ~10# box x5 trials without correction from therapist for correct technique   Time 6   Period Weeks   Status New               Plan - 03/10/16 1658    Clinical Impression Statement Pt is a 50yo F referred to OPPT with c/o chronic low back pain noticed ~2 years ago after she strained a muscle and received imaging to confirm she has arthritis in her back. She denies any numbness and tingling, loss of bowel/bladder function at this time and testing for nerve/disc pathology was negative with spurling's, SLR, and prone knee bend. Due to time constraints, limited information was collected regarding functional performance, however she does demonstrate limitations in B hip strength and noted pain with lumbar ROM testing. Significant tenderness to palpation was noted along Rt QL and piriformis, greater than the Lt. Spine mobility seems to be neither hyper or hypomobile and did not reproduce any pain. PT educated pt to be aware of fear avoidant behaviors to prevent further complications. She would benefit from skilled PT to provide education, reinforcement, manual techniques and exercise to address her limitations and improve her quality of life.   Rehab Potential Good   PT Frequency 2x / week   PT Duration 6 weeks   PT Treatment/Interventions ADLs/Self Care Home Management;Balance training;Therapeutic exercise;Therapeutic activities;Functional mobility training;Stair training;Gait training;Neuromuscular re-education;Patient/family education;Manual techniques;Passive range of motion  PT Next Visit Plan Assess: 3MWT, 5x sit/stand, provide HEP (QL stretch, abdominal bracing)   PT Home Exercise Plan Will be given next session   Recommended Other Services none   Consulted and Agree with Plan of Care Patient      Patient will benefit from skilled therapeutic intervention in order to improve the following deficits and impairments:  Decreased activity tolerance, Decreased strength,  Difficulty walking, Increased muscle spasms, Pain, Improper body mechanics, Impaired flexibility  Visit Diagnosis: Midline low back pain without sciatica  Muscle weakness (generalized)  Other abnormalities of gait and mobility  Other symptoms and signs involving the musculoskeletal system     Problem List Patient Active Problem List   Diagnosis Date Noted  . C7 radiculopathy 01/07/2016  . History of stroke   . Chronic back pain 12/23/2015  . Cerebrovascular accident (CVA) due to embolism ([place]) 12/16/2015  . Back pain 11/22/2015  . Asthma, chronic 02/16/2015  . Anxiety state 02/16/2015  . CKD (chronic kidney disease) stage 3, GFR 30-59 ml/min 04/28/2014  . Nausea with vomiting 01/25/2013  . Abnormal LFTs 01/25/2013  . Orthostatic hypotension 01/25/2013  . Unspecified constipation 12/27/2012  . Rectal bleeding 12/26/2012  . PE (pulmonary embolism) 05/03/2012  . Tachycardia 06/04/2011  . Hemorrhoids 06/04/2011  . Steroid-induced diabetes ([place]) 06/04/2011  . 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 Immunosuppressed status ([place]) 06/04/2011  . Depression 06/04/2011  . Obesity 06/04/2011  . Hypertension 03/18/2011  . ITP (idiopathic thrombocytopenic purpura) 03/18/2011   5:25 PM,03/10/2016 Elly Modena PT, DPT [facility] [place] [address] [place], [place], [address] Phone: [phone]   Fax:  [phone]  Name: Summer Cook MRN: [id number] Date of Birth: 04-30-1966   *Addendum to updated eval charges (low complexity)  7:35 AM,03/12/2016 Elly Modena PT, DPT [facility] [phone]   *Addendum made on 04/07/16 to discharge pt secondary to no show policy  PHYSICAL THERAPY DISCHARGE SUMMARY  Visits from Start of Care: 1 (evaluation)  Pt was evaluated on 03/10/16 and has missed her past 4 scheduled appointments without communication to our office.  Several attempts were made to remind her of upcoming appointments without any change. At this time, she is being discharged from PT secondary to our no show policy and will need a new order from her physician if she wishes to continue with therapy. I left a message notifying pt of this policy and her discharge status and encouraged her to call us with any further questions. Plan: Patient agrees to discharge.  Patient goals were not met. Patient is being discharged due to not returning since the last visit.  ?????   11:51 AM,04/07/2016 Elly Modena PT, DPT [facility] [phone]    *Note opened in error  4:58 PM,08/06/16 Elly Modena PT, DPT [facility] [phone]

## 2016-03-11 ENCOUNTER — Encounter (HOSPITAL_COMMUNITY): Payer: Medicare HMO

## 2016-03-11 ENCOUNTER — Telehealth (HOSPITAL_COMMUNITY): Payer: Self-pay

## 2016-03-11 NOTE — Telephone Encounter (Signed)
No show, called and left message concerning missed apt.  Included next apt date and time with contract info given  Ihor Austin, [place]; CB[facility] [phone]

## 2016-03-12 ENCOUNTER — Encounter: Payer: Self-pay | Admitting: *Deleted

## 2016-03-13 ENCOUNTER — Other Ambulatory Visit: Payer: Self-pay | Admitting: *Deleted

## 2016-03-13 DIAGNOSIS — R200 Anesthesia of skin: Secondary | ICD-10-CM

## 2016-03-16 ENCOUNTER — Encounter: Payer: Self-pay | Admitting: Gastroenterology

## 2016-03-16 ENCOUNTER — Ambulatory Visit (INDEPENDENT_AMBULATORY_CARE_PROVIDER_SITE_OTHER): Payer: Medicare HMO | Admitting: Gastroenterology

## 2016-03-16 ENCOUNTER — Other Ambulatory Visit: Payer: Medicare HMO

## 2016-03-16 VITALS — BP 102/68 | HR 100 | Ht 63.0 in | Wt 201.6 lb

## 2016-03-16 DIAGNOSIS — R634 Abnormal weight loss: Secondary | ICD-10-CM | POA: Diagnosis not present

## 2016-03-16 DIAGNOSIS — R6881 Early satiety: Secondary | ICD-10-CM

## 2016-03-16 DIAGNOSIS — K625 Hemorrhage of anus and rectum: Secondary | ICD-10-CM

## 2016-03-16 DIAGNOSIS — R159 Full incontinence of feces: Secondary | ICD-10-CM

## 2016-03-16 DIAGNOSIS — R197 Diarrhea, unspecified: Secondary | ICD-10-CM

## 2016-03-16 MED ORDER — COLESTIPOL HCL 1 G PO TABS: 1.0000 g | ORAL_TABLET | Freq: Two times a day (BID) | ORAL | Status: DC

## 2016-03-16 MED ORDER — NA SULFATE-K SULFATE-MG SULF 17.5-3.13-1.6 GM/177ML PO SOLN: ORAL | Status: DC

## 2016-03-16 NOTE — Patient Instructions (Addendum)
You have been scheduled for an endoscopy and colonoscopy. Please follow the written instructions given to you at your visit today. Please pick up your prep supplies at the pharmacy within the next 1-3 days. If you use inhalers (even only as needed), please bring them with you on the day of your procedure. Your physician has requested that you go to www.[email or web address].com and enter the access code given to you at your visit today. This web site gives a general overview about your procedure. However, you should still follow specific instructions given to you by our office regarding your preparation for the procedure.  Please decrease your Zoloft(sertraline) to 100 mg daily.  We have sent the following medications to your pharmacy for you to pick up at your convenience: Colestid twice daily prior to meals  Please purchase the following medications over the counter and take as directed: Imodium as needed for diarrhea  Your physician has requested that you go to the basement for the following lab work before leaving today: IgA, TTG  If you are age 60 or older, your body mass index should be between 23-30. Your Body mass index is 35.72 kg/(m^2). If this is out of the aforementioned range listed, please consider follow up with your Primary Care Provider.  If you are age 77 or younger, your body mass index should be between 19-25. Your Body mass index is 35.72 kg/(m^2). If this is out of the aformentioned range listed, please consider follow up with your Primary Care Provider.

## 2016-03-16 NOTE — Progress Notes (Signed)
HPI :  50 y/o female seen her in consultation for a second opinion for evaluation for loose stools and other GI complaints.   Patient reports loose stools ongoing since colonoscopy in 2014. She reports having a bowel movement shortly after anytime she eats or drinks anything. She thinks she has roughly 10 BMs per day. She reports after she eats she has a BM within 15 minutes, with strong urge. She reports some incontinence with this which is common. Stools are loose and watery. She denies any nocturnal stools. She is taking Zoloft 146m q day, she has been on this 3-4 years or so, and intermittently since 2006 or so.   She takes aspirin 883mdaily. She has had some blood in the stools with these symptoms, she thinks with 25% of her stools. She reports blood both in the stools and toilet water. She denies any perianal pain when seeing the blood. She has been trying to lose weight, but she has lost about 40 lbs in 2 months, much more than she had wanted to. She reports eating one meal per day in attempts to lose weight, but has been able to do so since she has early satiety and poor appetite. She has some nausea, but no routine vomiting.  She has some pyrosis but not significant or bothersome.   She has not been taking anything for her diarrhea at present time. She previously took hydrocodone for back pain which she thinks controlled her diarrhea better.  She has been placed on gabapentin for chronic pain and taken off hydrocodone, she was previously on narcotics but has been taken off since December, for chronic back / leg pain. She has taken immodium which has helped her in the past at higher doses, when taken PRN, but she says she doesn't like the way it makes her feel. She has some ongoing abdominal cramping with her symptoms. Having a bowel movement can take away some of her cramping. She does endorse some increased gas, not much bloating. She has had a negative GI pathologen panel and negative C Diff  testing.   Of note, she had a stroke on March 18th for which she is on aspirin, no residual deficits other than hand tingling for which she is followed by Neurology.   Prior endoscopic evaluation -  Colonoscopy 12/2012 - normal colon and ileum, small descending polyp - sessile serrated polyp, rare diverticulosis, hemorrhoids - this exam preceeded her symptoms EGD 12/2012 - esophageal candidiasis, nodular antrum, otherwise normal   Past Medical History  Diagnosis Date  . Essential hypertension, benign   . 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  Last was Prednisone 406m2 wk in 10/2012.  She also was on Prednisone bridged with Cellcept briefly but stopped due to lack of insurance.   . Depression   . Thrush, oral 05/29/2011  . Steroid-induced diabetes ([facility] . Pulmonary embolism ([facility]/2013  . Asthma   . Renal insufficiency   . Chronic chest pain   . Normal cardiac stress test 02/2014  . Gout   . Arthritis   . GERD (gastroesophageal reflux disease)   . Allergy   . Hemorrhoids   . Serrated adenoma of colon   . [facility]    Past Surgical History  Procedure Laterality Date  . Cholecystectomy  2008  . Bone marrow biopsy    . 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  Colonoscopy with esophagogastroduodenoscopy (egd) N/A 01/04/2013    Dr. Gala Romney: esophageal plaques with +KOH, hh. Gastric antrum abnormal, bx reactive gastropathy. Anal canal hemorrhoids, colonic tics, normal TI, single polyp (sessile serrated tubular adenoma). Next TCS 12/2017  . Cataract extraction    . Tee without cardioversion N/A 01/03/2016    Procedure: TRANSESOPHAGEAL ECHOCARDIOGRAM (TEE) WITH PROPOFOL;  Surgeon: Satira Sark, MD;  Location: [facility];  Service: Cardiovascular;  Laterality: N/A;   Family History  Problem Relation Age of Onset  . Cervical cancer Mother   . Lung cancer Father     . Pneumonia Brother   . Colon cancer Paternal Grandmother 88  . Colon cancer Paternal Grandfather 31  . AAA (abdominal aortic aneurysm) Paternal Grandmother   . Breast cancer Paternal Grandmother   . Colon polyps Father     ???  . Barrett's esophagus Paternal Aunt   . Stomach cancer Neg Hx   . Pancreatic cancer Neg Hx    Social History  Substance Use Topics  . Smoking status: Former Smoker -- 0 years    Types: Cigarettes    Quit date: 02/14/2009  . Smokeless tobacco: Never Used  . Alcohol Use: No   Current Outpatient Prescriptions  Medication Sig Dispense Refill  . aspirin EC 81 MG tablet Take 1 tablet (81 mg total) by mouth daily. 30 tablet 6  . atorvastatin (LIPITOR) 40 MG tablet Take 1 tablet (40 mg total) by mouth daily. 90 tablet 3  . furosemide (LASIX) 20 MG tablet Take 20-40 mg by mouth 2 (two) times daily as needed for fluid. Reported on 01/15/2016  2  . ipratropium-albuterol (DUONEB) 0.5-2.5 (3) MG/3ML SOLN Take 3 mLs by nebulization 3 (three) times daily as needed (shorntess of breath/wheezing). 360 mL 0  . KLOR-CON 10 10 MEQ tablet Take 10 mEq by mouth as needed (Only takes with the furosemide). Reported on 01/15/2016  2  . levothyroxine (SYNTHROID, LEVOTHROID) 50 MCG tablet Take 1 tablet (50 mcg total) by mouth daily. 90 tablet 3  . metoprolol succinate (TOPROL-XL) 50 MG 24 hr tablet Take 1 tablet (50 mg total) by mouth daily. 30 tablet 6  . OVER THE COUNTER MEDICATION Place 1 drop into both eyes daily as needed (Dry Eyes).    Marland Kitchen PROAIR RESPICLICK 793 (90 BASE) MCG/ACT AEPB Inhale 2 puffs into the lungs every 4 (four) hours as needed (Shortness of breath).   3  . sertraline (ZOLOFT) 50 MG tablet Take 150 mg by mouth daily.    . [DISCONTINUED] prochlorperazine (COMPAZINE) 10 MG tablet Take 10 mg by mouth as needed. For nausea      No current facility-administered medications for this visit.   Allergies  Allergen Reactions  . Brintellix [Vortioxetine] Itching  . Yellow  Jacket Venom Swelling  . Adhesive [Tape] Rash    Please use paper tape     Review of Systems: All systems reviewed and negative except where noted in HPI.    Lab Results  Component Value Date   WBC 6.0 02/20/2016   HGB 15.6* 02/20/2016   HCT 47.7* 02/20/2016   MCV 81.1 02/20/2016   PLT 111* 02/20/2016    Lab Results  Component Value Date   CREATININE 1.90* 12/16/2015   BUN 35* 12/16/2015   NA 138 12/16/2015   K 3.6 12/16/2015   CL 104 12/16/2015   CO2 23 12/16/2015    Lab Results  Component Value Date   ALT 22 12/16/2015   AST 38 12/16/2015  ALKPHOS 65 12/16/2015   BILITOT 0.6 12/16/2015      Physical Exam: BP 102/68 mmHg  Pulse 100  Ht _0  (1.6 m)  Wt 201 lb 9.6 oz (91.445 kg)  BMI 35.72 kg/m2  LMP 05/13/2011  Repeat HR when auscultating around 80s. Constitutional: Pleasant,well-developed, female in no acute distress. HEENT: Normocephalic and atraumatic. Conjunctivae are normal. No scleral icterus. Neck supple.  Cardiovascular: Normal rate, regular rhythm.  Pulmonary/chest: Effort normal and breath sounds normal. No wheezing, rales or rhonchi. Abdominal: Soft, nondistended, nontender. Bowel sounds active throughout. There are no masses palpable. No hepatomegaly. Extremities: no edema Lymphadenopathy: No cervical adenopathy noted. Neurological: Alert and oriented to person place and time. Skin: Skin is warm and dry. No rashes noted. Psychiatric: Normal mood and affect. Behavior is normal.   ASSESSMENT AND PLAN: 50 y/o female with 3 years of ongoing high frequency loose stools with fecal incontinence, now with rectal bleeding as well. She also endorses significant weight loss with 40 lbs of weight loss in recent months with early satiety, which is true (she weighed 240 as of March) however she has had large fluctuations in her weight over the past few years. DDx for her diarrhea includes microscopic colitis related to Zoloft use, IBD, IBS, etc.  Additionally, given her rectal bleeding, while this could be due to hemorrhoids in the setting of loose stools, recommend colonoscopy to further evaluate, rule out IBD and assess for microscopic colitis. In the interim, if okay with her primary care, she should start to taper down on Zoloft which can cause diarrhea, and she will go from 154m to 1052mdaily, and then down to 5076m weeks later if otherwise stable. May need to consider another SSRI moving forward if Zoloft thought to be contributing. We will also add on EGD given her weight loss and diarrhea, and add on labs for celiac to ensure negative. In the interim, she can use immodium PRN as this helps, and will try Colestid a few times per day to see if this helps. I discussed risks / benefits of EGD and colonoscopy with the patient, she is > 3 months out from CVA and [facility] her [facility] throughout. Further recommendations pending results of testing.   SteCarolina CellarD [facility] Pager [phone]: CraHoyt Koch

## 2016-03-17 ENCOUNTER — Ambulatory Visit (INDEPENDENT_AMBULATORY_CARE_PROVIDER_SITE_OTHER): Payer: Medicare HMO | Admitting: Neurology

## 2016-03-17 ENCOUNTER — Encounter (HOSPITAL_COMMUNITY): Payer: Medicare HMO

## 2016-03-17 DIAGNOSIS — G5602 Carpal tunnel syndrome, left upper limb: Secondary | ICD-10-CM

## 2016-03-17 LAB — TISSUE TRANSGLUTAMINASE, IGA

## 2016-03-17 LAB — IGA: IgA/Immunoglobulin A, Serum: 356 mg/dL — ABNORMAL HIGH (ref 87–352)

## 2016-03-17 NOTE — Procedures (Signed)
[facility]  [place], [place]  [place], [place] [address] Tel: [phone] Fax:  [phone] Test Date:  03/17/2016  Patient: Summer Cook DOB: 06-12-1966 Physician: Narda Amber, DO  Sex: Female Height: 5\' 4"  Ref Phys: Metta Clines  ID#: [id number] Temp: 34.2C Technician:    Patient Complaints: This is a 50 year old female referred for evaluation of left hand numbness and tingling.  NCV & EMG Findings: Extensive electrodiagnostic testing of the left upper extremity shows: 1. Left median sensory nerve showed prolonged distal peak latency (4.3 ms) and reduced amplitude (7.3 V).   Left ulnar sensory response is within normal limits. 2. Left median motor response shows prolonged distal onset latency (5.5 ms) and reduced amplitude (4.0 mV).  Left ulnar motor responses within normal limits. 3. Chronic motor axon loss changes isolated to the left abductor pollicis brevis muscle, without accompanied active denervation.   Impression: Left median neuropathy at or distal to the wrist, consistent with the clinical diagnosis of carpal tunnel syndrome. Overall, these findings are severe in degree electrically.   ___________________________ Narda Amber, DO    Nerve Conduction Studies Anti Sensory Summary Table   Site NR Peak (ms) Norm Peak (ms) P-T Amp (V) Norm P-T Amp  Left Median Anti Sensory (2nd Digit)  34.2C  Wrist    4.3 <3.6 7.3 >15  Left Ulnar Anti Sensory (5th Digit)  34.2C  Wrist    2.8 <3.1 11.7 >10   Motor Summary Table   Site NR Onset (ms) Norm Onset (ms) O-P Amp (mV) Norm O-P Amp Site1 Site2 Delta-0 (ms) Dist (cm) Vel (m/s) Norm Vel (m/s)  Left Median Motor (Abd Poll Brev)  34.2C  Wrist    5.5 <4.0 4.0 >6 Elbow Wrist 3.8 21.0 55 >50  Elbow    9.3  4.0         Left Ulnar Motor (Abd Dig Minimi)  34.2C  Wrist    2.6 <3.1 7.4 >7 B Elbow Wrist 3.1 17.0 55 >50  B Elbow    5.7  6.6  A Elbow B Elbow 1.5 10.0 67 >50  A Elbow    7.2  6.2           EMG   Side Muscle Ins Act Fibs Psw Fasc Number Recrt Dur Dur. Amp Amp. Poly Poly. Comment  Left 1stDorInt Nml Nml Nml Nml Nml Nml Nml Nml Nml Nml Nml Nml N/A  Left Abd Poll Brev Nml Nml Nml Nml 1- Rapid Few 1+ Few 1+ Nml Nml N/A  Left Ext Indicis Nml Nml Nml Nml Nml Nml Nml Nml Nml Nml Nml Nml N/A  Left PronatorTeres Nml Nml Nml Nml Nml Nml Nml Nml Nml Nml Nml Nml N/A  Left Biceps Nml Nml Nml Nml Nml Nml Nml Nml Nml Nml Nml Nml N/A  Left Triceps Nml Nml Nml Nml Nml Nml Nml Nml Nml Nml Nml Nml N/A  Left Deltoid Nml Nml Nml Nml Nml Nml Nml Nml Nml Nml Nml Nml N/A      Waveforms:

## 2016-03-18 ENCOUNTER — Telehealth: Payer: Self-pay

## 2016-03-18 NOTE — Telephone Encounter (Signed)
-----   Message from Pieter Partridge, DO sent at 03/18/2016  8:29 AM EDT ----- Nerve study confirms carpal tunnel.  I would recommend referral to the hand clinic for further treatment.

## 2016-03-18 NOTE — Telephone Encounter (Signed)
Message relayed to patient. Verbalized understanding and denied questions. Referral faxed to [facility] [address] [place], [place], [place] [facility]: [phone] Fax: [phone]

## 2016-03-19 ENCOUNTER — Telehealth (HOSPITAL_COMMUNITY): Payer: Self-pay | Admitting: Physical Therapy

## 2016-03-19 ENCOUNTER — Ambulatory Visit (HOSPITAL_COMMUNITY): Payer: Medicare HMO | Admitting: Physical Therapy

## 2016-03-19 NOTE — Telephone Encounter (Signed)
Reminded pt of missed appointment. Dr's appointments came up for her Aunt and she thought her daughter called to cancel today's appointments. I reminded her about her next apt and she thinks she should be able to make it. She will call tomorrow if she can't make it.   6:10 PM,03/19/2016 Elly Modena PT, [facility] [phone]

## 2016-03-20 ENCOUNTER — Telehealth: Payer: Self-pay

## 2016-03-20 NOTE — Telephone Encounter (Signed)
04/27/16 @ 10 a.m. With Dr. Amedeo Plenty @ [facility] [place], [place], [place], [place] [facility]: [phone] Fax: [phone]

## 2016-03-23 ENCOUNTER — Ambulatory Visit (INDEPENDENT_AMBULATORY_CARE_PROVIDER_SITE_OTHER): Payer: Medicare HMO | Admitting: Orthopedic Surgery

## 2016-03-23 ENCOUNTER — Telehealth (HOSPITAL_COMMUNITY): Payer: Self-pay

## 2016-03-23 DIAGNOSIS — M5441 Lumbago with sciatica, right side: Secondary | ICD-10-CM

## 2016-03-23 NOTE — Progress Notes (Signed)
NS

## 2016-03-23 NOTE — Telephone Encounter (Signed)
03/23/16 pt just wanted to cx this week only, no reason given

## 2016-03-24 ENCOUNTER — Encounter (HOSPITAL_COMMUNITY): Payer: Medicare HMO

## 2016-03-24 ENCOUNTER — Encounter: Payer: Self-pay | Admitting: Gastroenterology

## 2016-03-25 ENCOUNTER — Encounter: Payer: Self-pay | Admitting: Neurology

## 2016-03-26 ENCOUNTER — Encounter (HOSPITAL_COMMUNITY): Payer: Medicare HMO | Admitting: Physical Therapy

## 2016-03-27 ENCOUNTER — Encounter (HOSPITAL_COMMUNITY): Payer: Medicare HMO | Admitting: Physical Therapy

## 2016-03-29 IMAGING — CR DG CHEST 2V
1 series · 1 of 1 positions shown · non-contrast
Comparison: 07/27/2014

CLINICAL DATA: Intermittent dyspnea of 2 weeks duration.

EXAM:
CHEST  2 VIEW

[view not recorded]
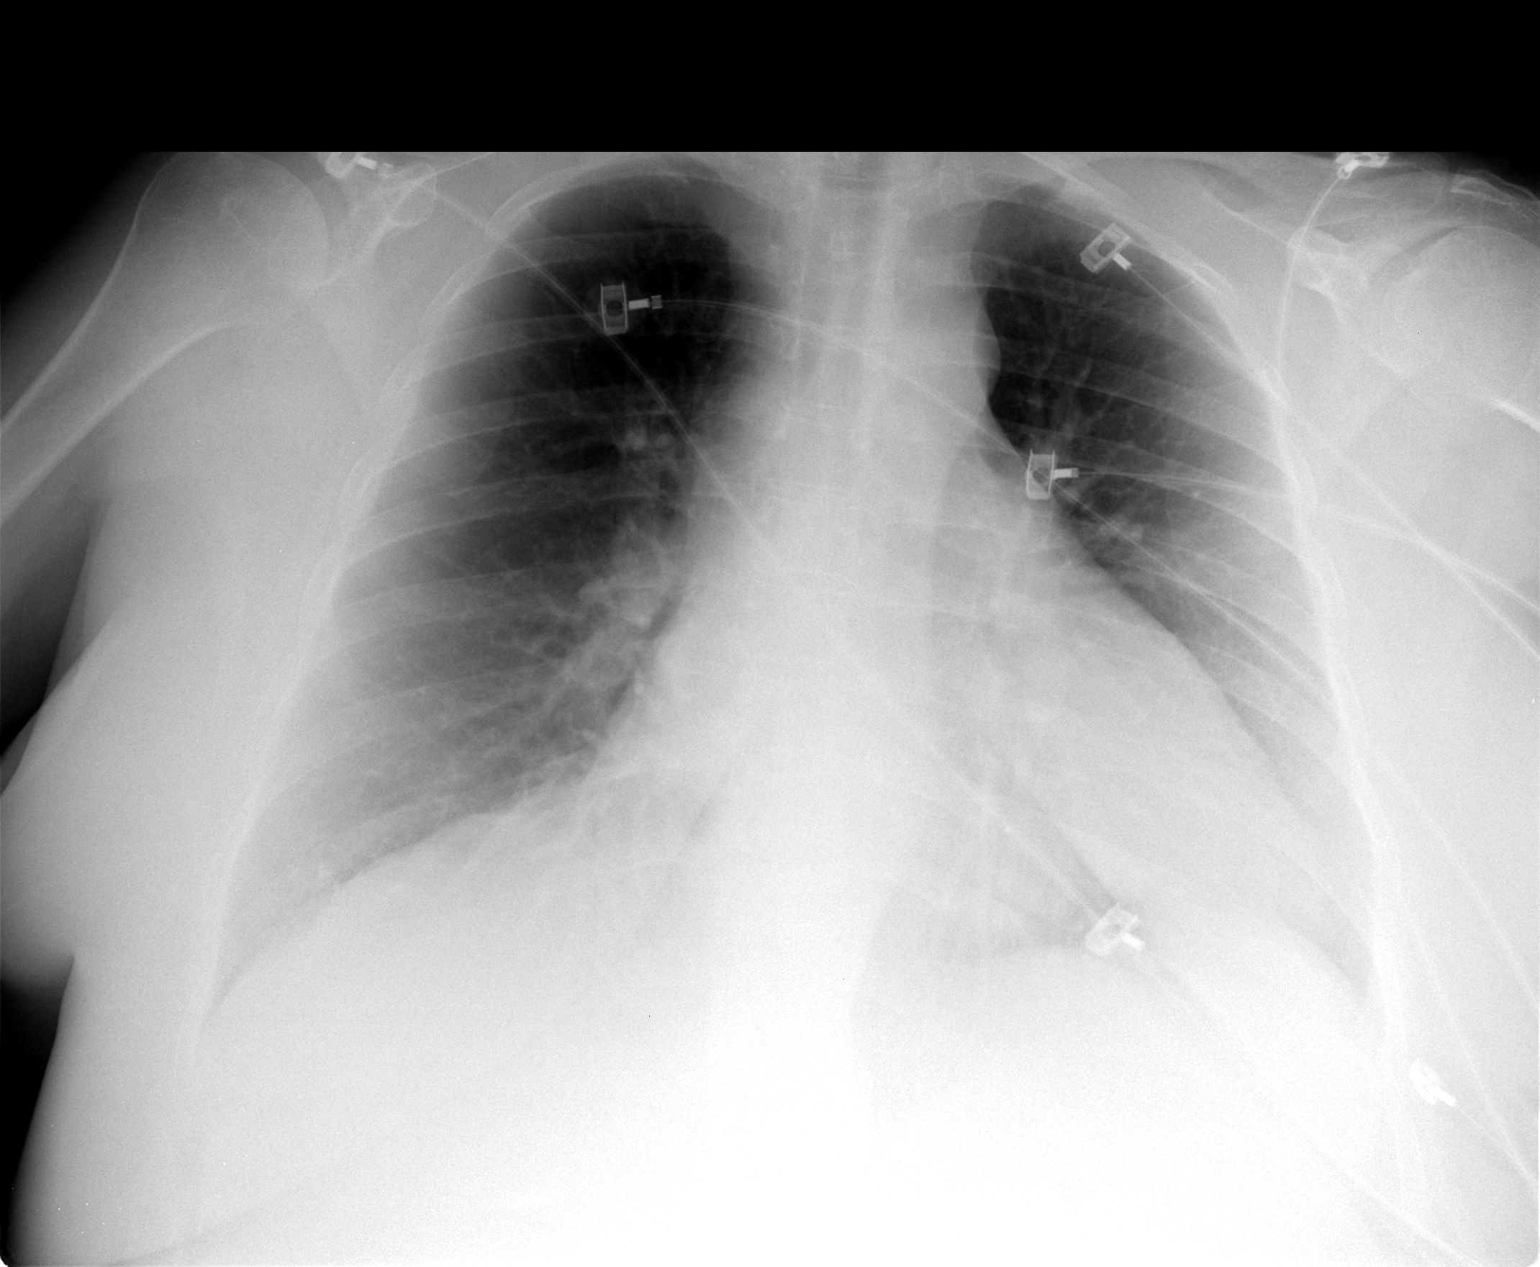

[1 of 1 positions shown; findings below may reference images not displayed]

FINDINGS: A single portable view of the chest obtained in a sitting position
demonstrates unchanged moderately prominent cardiac contours. The
lungs are grossly clear. No large effusion or pneumothorax is
evident. Pulmonary vasculature is normal.
IMPRESSION: No acute findings

## 2016-03-29 IMAGING — CR DG FOOT COMPLETE 3+V*L*
3 series · 3 of 3 positions shown · non-contrast
Comparison: Plain films the left foot 12/22/2012.

CLINICAL DATA: Swelling over the dorsum of the distal aspect of the
left foot for 2 weeks. No known injury.

EXAM:
LEFT FOOT - COMPLETE 3+ VIEW

[view not recorded (1 of 3)]
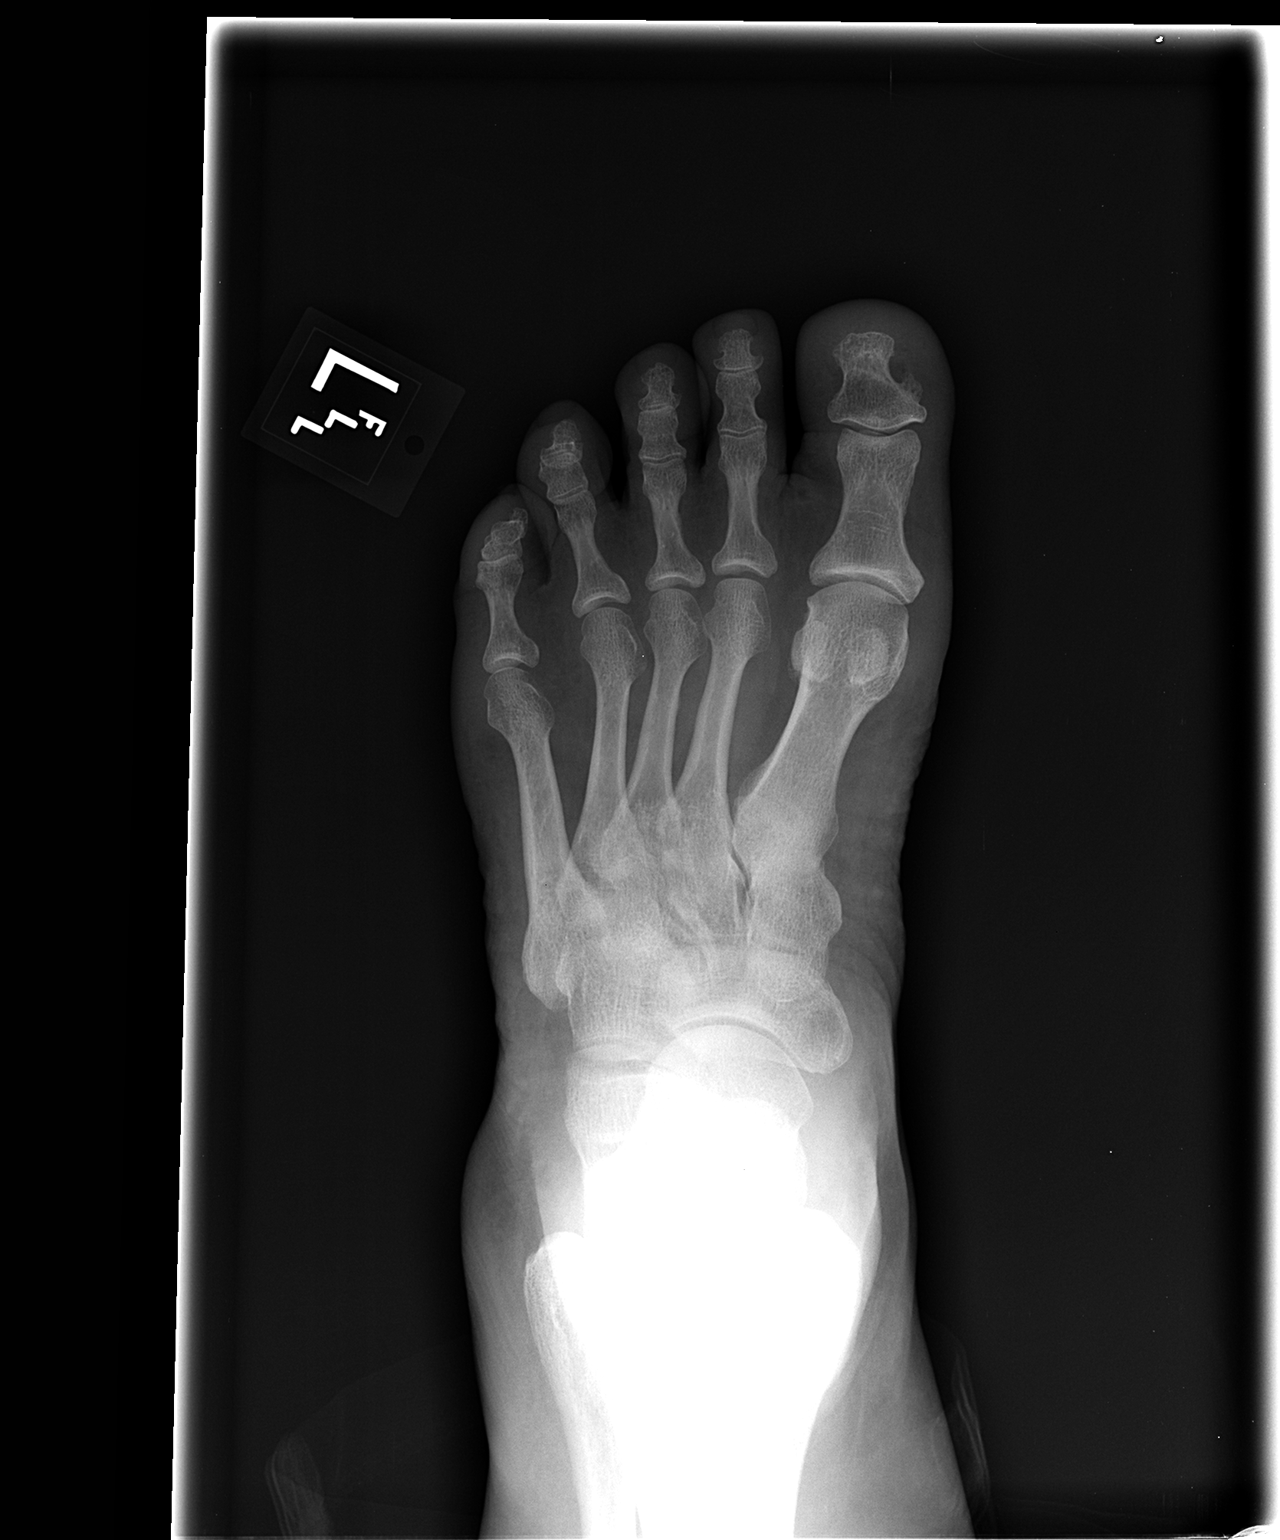

[view not recorded (2 of 3)]
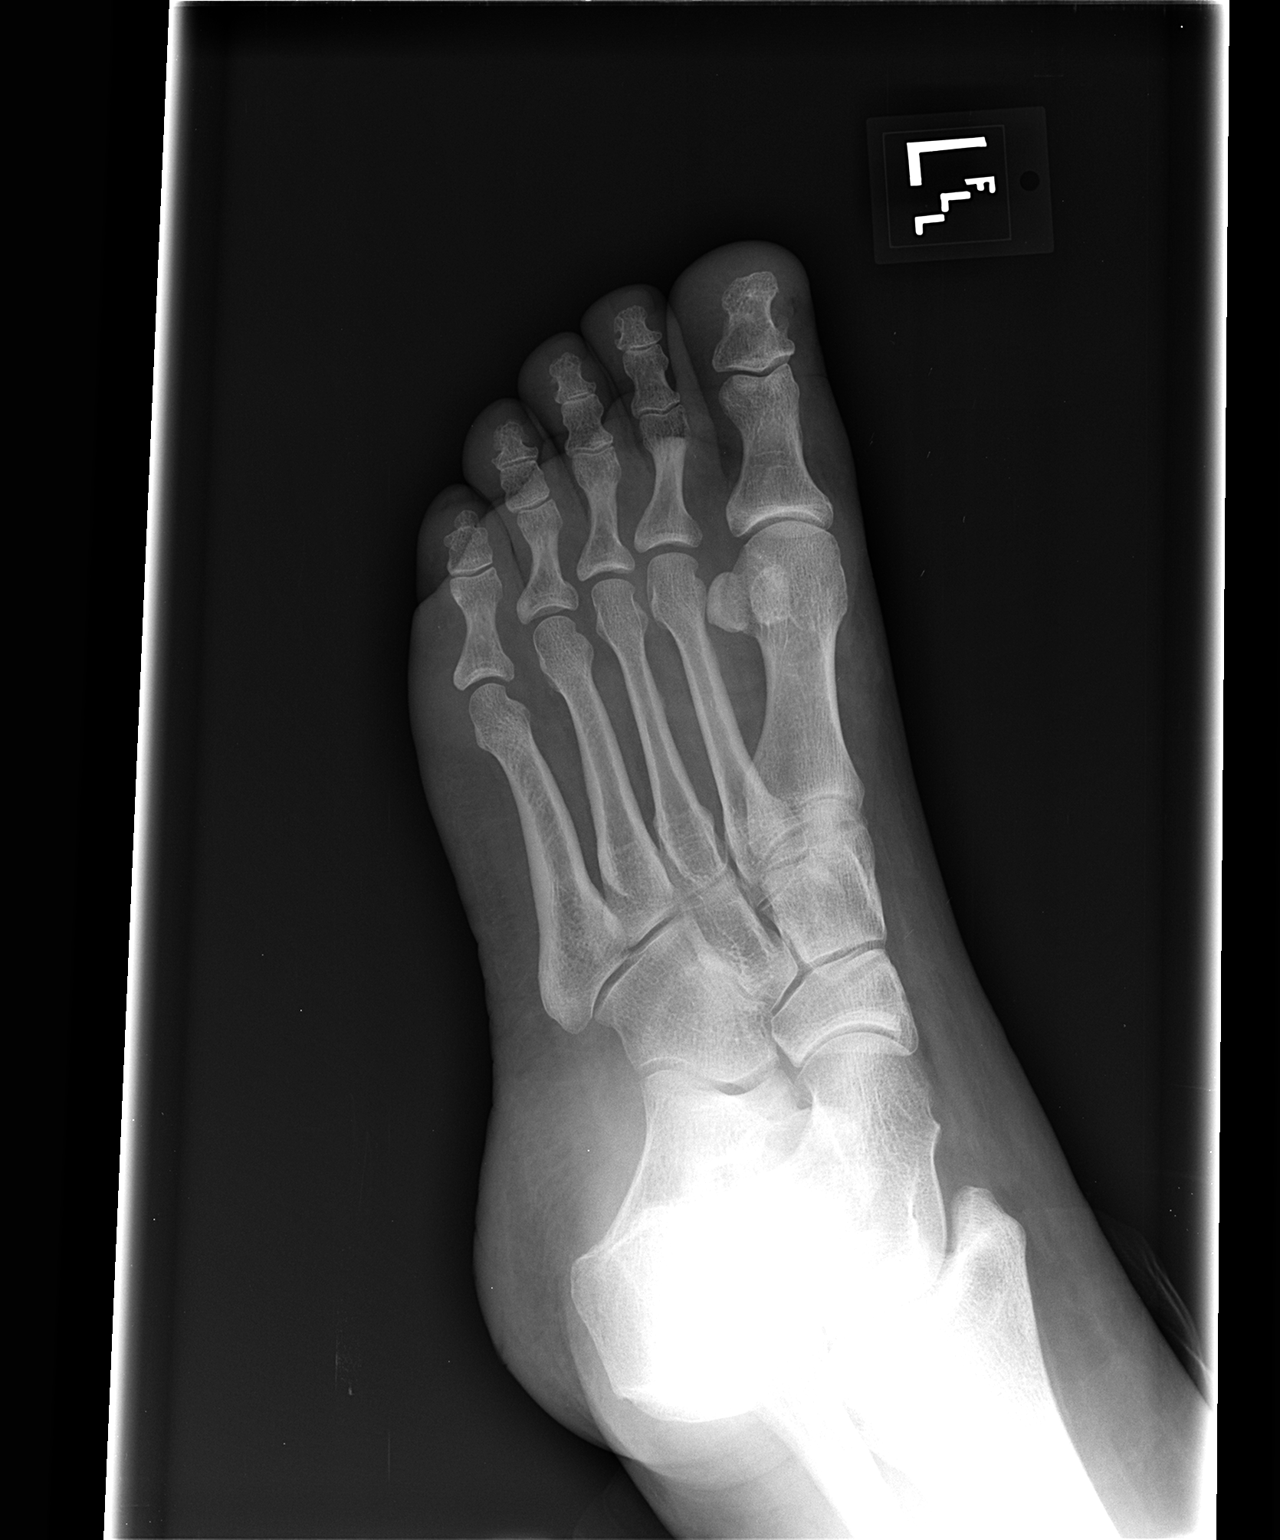

[view not recorded (3 of 3)]
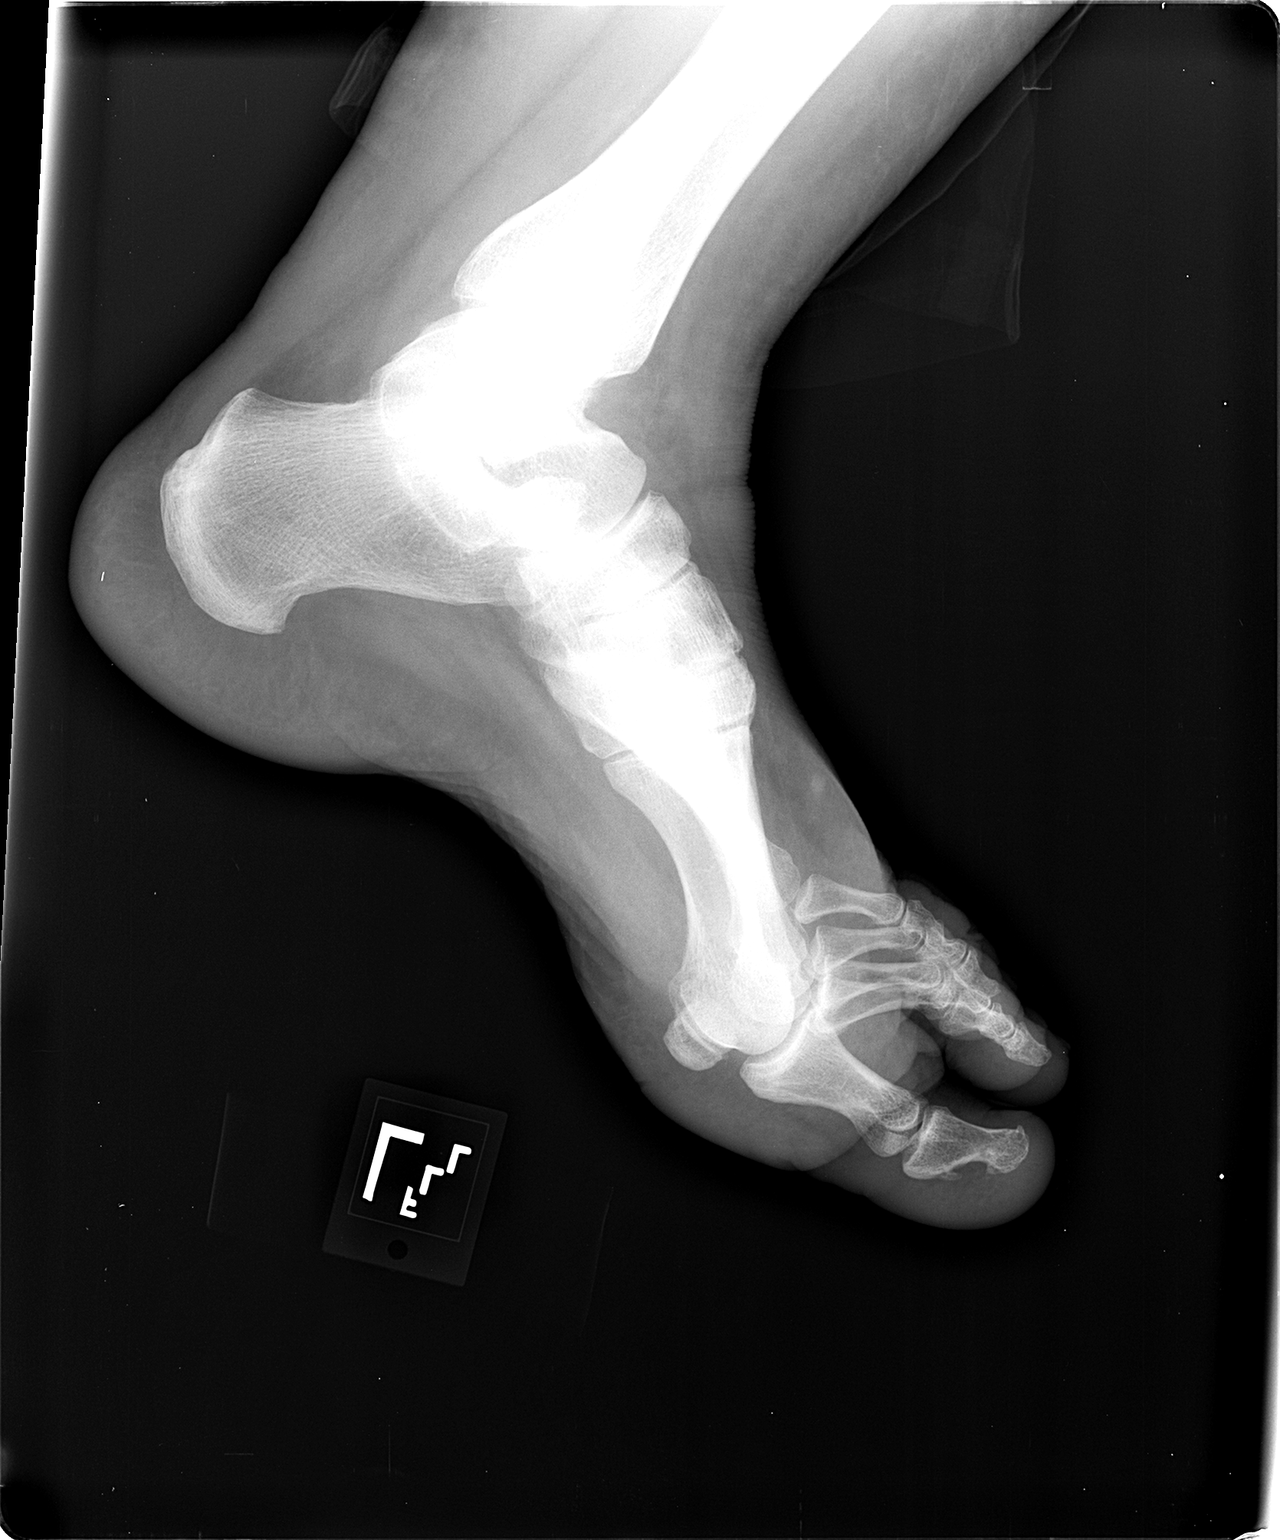

[3 of 3 positions shown; findings below may reference images not displayed]

FINDINGS: No fracture or other focal bony or joint abnormality is identified.
Soft tissues do not appear notably different compared to the prior
examination.
IMPRESSION: Negative exam.

## 2016-03-30 NOTE — Telephone Encounter (Signed)
Appointments have been completed

## 2016-04-01 ENCOUNTER — Telehealth (HOSPITAL_COMMUNITY): Payer: Self-pay

## 2016-04-01 ENCOUNTER — Telehealth: Payer: Self-pay | Admitting: Gastroenterology

## 2016-04-01 ENCOUNTER — Ambulatory Visit (HOSPITAL_COMMUNITY): Payer: Medicare HMO | Attending: Neurology

## 2016-04-01 NOTE — Telephone Encounter (Signed)
No show, called and left message about missed apt.  Reminded next apt date and time with contact info given.  Deleted remaining apt.s except next one per no show policy.  [address] [place]; [place] [phone]

## 2016-04-01 NOTE — Telephone Encounter (Signed)
Thanks for letting me know. You can waive the cancellation fee for family emergency

## 2016-04-02 ENCOUNTER — Encounter: Payer: Medicare HMO | Admitting: Gastroenterology

## 2016-04-02 ENCOUNTER — Ambulatory Visit: Payer: Medicare HMO | Admitting: Family

## 2016-04-02 ENCOUNTER — Encounter: Payer: Self-pay | Admitting: Physical Medicine & Rehabilitation

## 2016-04-02 ENCOUNTER — Encounter: Payer: Medicare HMO | Attending: Physical Medicine & Rehabilitation | Admitting: Physical Medicine & Rehabilitation

## 2016-04-02 VITALS — BP 131/76 | HR 66 | Resp 17

## 2016-04-02 DIAGNOSIS — F329 Major depressive disorder, single episode, unspecified: Secondary | ICD-10-CM | POA: Diagnosis not present

## 2016-04-02 DIAGNOSIS — M2578 Osteophyte, vertebrae: Secondary | ICD-10-CM | POA: Diagnosis not present

## 2016-04-02 DIAGNOSIS — E098 Drug or chemical induced diabetes mellitus with unspecified complications: Secondary | ICD-10-CM | POA: Diagnosis not present

## 2016-04-02 DIAGNOSIS — N289 Disorder of kidney and ureter, unspecified: Secondary | ICD-10-CM | POA: Diagnosis not present

## 2016-04-02 DIAGNOSIS — M109 Gout, unspecified: Secondary | ICD-10-CM | POA: Insufficient documentation

## 2016-04-02 DIAGNOSIS — J45909 Unspecified asthma, uncomplicated: Secondary | ICD-10-CM | POA: Diagnosis not present

## 2016-04-02 DIAGNOSIS — K649 Unspecified hemorrhoids: Secondary | ICD-10-CM | POA: Insufficient documentation

## 2016-04-02 DIAGNOSIS — M545 Low back pain: Secondary | ICD-10-CM | POA: Insufficient documentation

## 2016-04-02 DIAGNOSIS — Z79899 Other long term (current) drug therapy: Secondary | ICD-10-CM | POA: Diagnosis not present

## 2016-04-02 DIAGNOSIS — Z86711 Personal history of pulmonary embolism: Secondary | ICD-10-CM | POA: Diagnosis not present

## 2016-04-02 DIAGNOSIS — M5441 Lumbago with sciatica, right side: Secondary | ICD-10-CM

## 2016-04-02 DIAGNOSIS — G894 Chronic pain syndrome: Secondary | ICD-10-CM | POA: Diagnosis not present

## 2016-04-02 DIAGNOSIS — R69 Illness, unspecified: Secondary | ICD-10-CM | POA: Diagnosis not present

## 2016-04-02 DIAGNOSIS — Z8673 Personal history of transient ischemic attack (TIA), and cerebral infarction without residual deficits: Secondary | ICD-10-CM | POA: Insufficient documentation

## 2016-04-02 DIAGNOSIS — Z5181 Encounter for therapeutic drug level monitoring: Secondary | ICD-10-CM | POA: Diagnosis not present

## 2016-04-02 DIAGNOSIS — M47816 Spondylosis without myelopathy or radiculopathy, lumbar region: Secondary | ICD-10-CM | POA: Insufficient documentation

## 2016-04-02 DIAGNOSIS — I10 Essential (primary) hypertension: Secondary | ICD-10-CM | POA: Diagnosis not present

## 2016-04-02 DIAGNOSIS — IMO0001 Reserved for inherently not codable concepts without codable children: Secondary | ICD-10-CM

## 2016-04-02 DIAGNOSIS — M791 Myalgia: Secondary | ICD-10-CM

## 2016-04-02 DIAGNOSIS — K219 Gastro-esophageal reflux disease without esophagitis: Secondary | ICD-10-CM | POA: Diagnosis not present

## 2016-04-02 DIAGNOSIS — M609 Myositis, unspecified: Secondary | ICD-10-CM

## 2016-04-02 DIAGNOSIS — M47817 Spondylosis without myelopathy or radiculopathy, lumbosacral region: Secondary | ICD-10-CM | POA: Insufficient documentation

## 2016-04-02 DIAGNOSIS — N189 Chronic kidney disease, unspecified: Secondary | ICD-10-CM | POA: Insufficient documentation

## 2016-04-02 DIAGNOSIS — B3781 Candidal esophagitis: Secondary | ICD-10-CM | POA: Insufficient documentation

## 2016-04-02 DIAGNOSIS — B379 Candidiasis, unspecified: Secondary | ICD-10-CM | POA: Diagnosis not present

## 2016-04-02 NOTE — Progress Notes (Signed)
Subjective:    Patient ID: Summer Cook, female    DOB: 1966-04-27, 50 y.o.   MRN: [id number]  HPI  50 y/o with pmh of HTN, CKD, ITP, depression, anxiety, gout, stroke without residual deficits presents for evaluation of low back pain.  Present for ~3 years, getting progressively worse.  She denies inciting events. She does note she was on prednisone for a long time prior to the pain.  Hydrocodone improves the pain.  Prolonged sitting/standing/ambultion exacerbate the pain.  Describes the pain as sharp and dull.  Radiates to b/l legs.  Constant.  Today 9/10 in severity.  Associated with tingling and fatigue.  Denies associated numbness, weakness.  She has had 2 falls, most recently ~02/2016.  Pain limits her from doing activities she enjoys, like shopping.    Pain Inventory Average Pain 7 Pain Right Now 9 My pain is sharp, dull, tingling and aching  In the last 24 hours, has pain interfered with the following? General activity 8 Relation with others 8 Enjoyment of life 10 What TIME of day is your pain at its worst? morning, evening  Sleep (in general) Fair  Pain is worse with: walking, bending, sitting, inactivity, standing and some activites Pain improves with: medication Relief from Meds: 2  Mobility walk without assistance how many minutes can you walk? 10 ability to climb steps?  yes do you drive?  yes transfers alone Do you have any goals in this area?  yes  Function disabled: date disabled 2012  Neuro/Psych weakness numbness tingling dizziness depression anxiety  Prior Studies x-rays CT/MRI nerve study  Physicians involved in your care Primary care Dr. Sharlet Salina  Neurologist Dr. Tomi Likens Orthopedist Dr. Aline Brochure   Family History  Problem Relation Age of Onset  . Cervical cancer Mother   . Lung cancer Father   . Pneumonia Brother   . Colon cancer Paternal Grandmother 88  . Colon cancer Paternal Grandfather 33  . AAA (abdominal aortic aneurysm) Paternal  Grandmother   . Breast cancer Paternal Grandmother   . Colon polyps Father     ???  . Barrett's esophagus Paternal Aunt   . Stomach cancer Neg Hx   . Pancreatic cancer Neg Hx    Social History   Social History  . Marital Status: Divorced    Spouse Name: N/A  . Number of Children: 1  . Years of Education: N/A   Occupational History  .      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    Social History Main Topics  . Smoking status: Former Smoker -- 0 years    Types: Cigarettes    Quit date: 02/14/2009  . Smokeless tobacco: Never Used  . Alcohol Use: No  . Drug Use: No  . Sexual Activity: No   Other Topics Concern  . None   Social History Narrative   Lives with her daughter and aunt in a one story home.  Has 1 daughter.  On disability.  Did work as a Optometrist and bus Geophysicist/field seismologist.  Education: some college.    Past Surgical History  Procedure Laterality Date  . Cholecystectomy  2008  . Bone marrow biopsy    . 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 Colonoscopy with esophagogastroduodenoscopy (egd) N/A 01/04/2013    Dr. Gala Romney: esophageal plaques with +KOH, hh. Gastric antrum abnormal, bx reactive gastropathy. Anal canal hemorrhoids, colonic tics, normal TI, single polyp (sessile serrated tubular adenoma). Next TCS 12/2017  . Cataract extraction    . Darden Dates  without cardioversion N/A 01/03/2016    Procedure: TRANSESOPHAGEAL ECHOCARDIOGRAM (TEE) WITH PROPOFOL;  Surgeon: Satira Sark, MD;  Location: [facility];  Service: Cardiovascular;  Laterality: N/A;   Past Medical History  Diagnosis Date  . Essential hypertension, benign   . 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  Last was Prednisone 18m; 2 wk in 10/2012.  She also was on Prednisone bridged with Cellcept briefly but stopped due to lack of insurance.   . Depression   . Thrush, oral 05/29/2011  . 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  Pulmonary embolism ([facility] 04/2012  . Asthma   . Renal insufficiency   . Chronic chest pain   . Normal cardiac stress test 02/2014  . Gout   . Arthritis   . GERD (gastroesophageal reflux disease)   . Allergy   . Hemorrhoids   . Serrated adenoma of colon   . Candida esophagitis ([facility])    BP 131/76 mmHg  Pulse 66  Resp 17  SpO2 94%  LMP 05/13/2011  Opioid Risk Score:   Fall Risk Score:  `1  Depression screen PHQ 2/9  Depression screen PHQ 2/9 04/02/2016  Decreased Interest 2  Down, Depressed, Hopeless 2  PHQ - 2 Score 4  Altered sleeping 1  Tired, decreased energy 3  Change in appetite 1  Feeling bad or failure about yourself  1  Trouble concentrating 0  Moving slowly or fidgety/restless 2  Suicidal thoughts 0  PHQ-9 Score 12  Difficult doing work/chores Somewhat difficult   Review of Systems  Constitutional: Positive for diaphoresis and unexpected weight change.  Respiratory: Positive for shortness of breath.   Cardiovascular:       Limb swelling   Gastrointestinal: Positive for nausea and diarrhea.  Neurological: Positive for dizziness, weakness and numbness.       Tingling   Psychiatric/Behavioral: Positive for dysphoric mood. The patient is nervous/anxious.   All other systems reviewed and are negative.     Objective:   Physical Exam Gen: NAD. Vital signs reviewed HENT: Normocephalic, Atraumatic Eyes: EOMI, Conj WNL Cardio: S1, S2 normal, RRR Pulm: B/l clear to auscultation.  Effort normal Abd: Soft, non-distended, non-tender, BS+ MSK:  Gait WNL.   TTP diffusely along lower back and gluteal area.    No edema.   Pain with ROM of lower back Neuro: CN II-XII grossly intact.    Sensation intact to light touch in all LE dermatomes, except for b/l L4  Reflexes 3+ b/l LE  Strength  5/5 in all LE myotomes  Neg SLR b/l Skin: Warm and Dry    Assessment & Plan:  50y/o with pmh of HTN, CKD, ITP, depression, anxiety, gout, stroke without residual deficits presents  for evaluation of low back pain.  1. Mechanical low back pain  Lumbar xray 02/11/16 reviewed, showing  osteophytes at  L4 level. Facet arthritis L4-L5 L5-S1. Spondylosis mild to moderate lumbar spine.  She has tried tramadol without benefit  She had PT eval in 02/2016, she is scheduled to see them again  She has an appointment with Ortho in 7/12  She has used a back brace, with ?benefit  Pt states she has used TENs without benefit  Will consider MBB in future  2. Myalgia  Trigger point injections performed  Trigger point injection procedure note: Trigger Point Injection: Written consent was obtained for the patient. Trigger points were identified of b/l lumbar  PSPs and gluteal muscles. The areas were cleaned with alcohol, and each of  these trigger points were injected with 1 cc of a mixture of 4.5cc of 0.5% Marcaine with 0.5cc of 88m Celestone. Needle draw back was performed. There were no complications from the procedure, and it was well tolerated.

## 2016-04-03 ENCOUNTER — Encounter (HOSPITAL_COMMUNITY): Payer: Medicare HMO | Admitting: Physical Therapy

## 2016-04-07 ENCOUNTER — Telehealth (HOSPITAL_COMMUNITY): Payer: Self-pay | Admitting: Physical Therapy

## 2016-04-07 ENCOUNTER — Ambulatory Visit (HOSPITAL_COMMUNITY): Payer: Medicare HMO | Admitting: Physical Therapy

## 2016-04-07 NOTE — Telephone Encounter (Signed)
LMOM reminded pt of missed apt today. Discussed no show policy and her being removed from schedule/discharged from PT as a result of several consecutive no shows. Noted that she will need a new order from the referred physician before she can return.  11:45 AM,04/07/2016 [place], [facility] [phone]

## 2016-04-08 ENCOUNTER — Ambulatory Visit: Payer: Medicare HMO | Admitting: Physical Medicine & Rehabilitation

## 2016-04-09 ENCOUNTER — Encounter (HOSPITAL_BASED_OUTPATIENT_CLINIC_OR_DEPARTMENT_OTHER): Payer: Medicare HMO | Admitting: Physical Medicine & Rehabilitation

## 2016-04-09 ENCOUNTER — Encounter: Payer: Self-pay | Admitting: Physical Medicine & Rehabilitation

## 2016-04-09 ENCOUNTER — Encounter (HOSPITAL_COMMUNITY): Payer: Medicare HMO | Admitting: Physical Therapy

## 2016-04-09 VITALS — BP 110/78 | HR 64 | Resp 17

## 2016-04-09 DIAGNOSIS — M791 Myalgia: Secondary | ICD-10-CM

## 2016-04-09 DIAGNOSIS — E098 Drug or chemical induced diabetes mellitus with unspecified complications: Secondary | ICD-10-CM | POA: Diagnosis not present

## 2016-04-09 DIAGNOSIS — I10 Essential (primary) hypertension: Secondary | ICD-10-CM | POA: Diagnosis not present

## 2016-04-09 DIAGNOSIS — M609 Myositis, unspecified: Secondary | ICD-10-CM

## 2016-04-09 DIAGNOSIS — M545 Low back pain: Secondary | ICD-10-CM | POA: Diagnosis not present

## 2016-04-09 DIAGNOSIS — R69 Illness, unspecified: Secondary | ICD-10-CM | POA: Diagnosis not present

## 2016-04-09 DIAGNOSIS — Z86711 Personal history of pulmonary embolism: Secondary | ICD-10-CM | POA: Diagnosis not present

## 2016-04-09 DIAGNOSIS — J45909 Unspecified asthma, uncomplicated: Secondary | ICD-10-CM | POA: Diagnosis not present

## 2016-04-09 DIAGNOSIS — M47817 Spondylosis without myelopathy or radiculopathy, lumbosacral region: Secondary | ICD-10-CM | POA: Diagnosis not present

## 2016-04-09 DIAGNOSIS — N289 Disorder of kidney and ureter, unspecified: Secondary | ICD-10-CM | POA: Diagnosis not present

## 2016-04-09 DIAGNOSIS — M2578 Osteophyte, vertebrae: Secondary | ICD-10-CM | POA: Diagnosis not present

## 2016-04-09 DIAGNOSIS — M47816 Spondylosis without myelopathy or radiculopathy, lumbar region: Secondary | ICD-10-CM | POA: Diagnosis not present

## 2016-04-09 DIAGNOSIS — IMO0001 Reserved for inherently not codable concepts without codable children: Secondary | ICD-10-CM

## 2016-04-09 NOTE — Progress Notes (Signed)
Pt had last trigger point injections on 7/13 with good benefit.    Trigger point injection procedure note: Trigger Point Injection: Written consent was obtained for the patient. Trigger points were identified of b/l lumbar PSPs and gluteal muscles. The areas were cleaned with alcohol, and each of these trigger points were injected with 1 cc of 0.5% Marcaine each. Needle draw back was performed. There were no complications from the procedure, and it was well tolerated.

## 2016-04-09 NOTE — Progress Notes (Addendum)
Procedure note.

## 2016-04-14 ENCOUNTER — Encounter (HOSPITAL_COMMUNITY): Payer: Medicare HMO | Admitting: Physical Therapy

## 2016-04-14 ENCOUNTER — Telehealth: Payer: Self-pay | Admitting: Physical Medicine & Rehabilitation

## 2016-04-14 NOTE — Telephone Encounter (Signed)
Patient had an injection on Thursday and it isn't working.  She would like medication.  Please call patient.

## 2016-04-14 NOTE — Telephone Encounter (Signed)
Trigger points were done on 04/09/16. Please advise?

## 2016-04-15 NOTE — Telephone Encounter (Signed)
It appears pt has missed several appointments with physical therapy. She will not be able to obtain a prescription for narcotics, which, I believe, she is looking for without being compliant with other recommendations.  We can discuss further injections on next visit.  Thank you.

## 2016-04-16 ENCOUNTER — Encounter (HOSPITAL_COMMUNITY): Payer: Medicare HMO | Admitting: Physical Therapy

## 2016-04-17 MED ORDER — TRAMADOL HCL 50 MG PO TABS: 50.0000 mg | ORAL_TABLET | Freq: Three times a day (TID) | ORAL | Status: DC | PRN

## 2016-04-17 NOTE — Telephone Encounter (Signed)
We may prescribe her tramadol 50TID PRN. Thanks.

## 2016-04-17 NOTE — Telephone Encounter (Signed)
Left message to inform pt

## 2016-04-17 NOTE — Telephone Encounter (Signed)
I advised pt that narcotics will not be given at this time. She states that she had to cancel PT due to taking her aunts to doctor's appointments. I advised the pt that she needs to be complaint with PT. She is still in a great deal of pain. Please advise what pt can do for her pain until her next appointment on 05/13/16.

## 2016-04-17 NOTE — Telephone Encounter (Signed)
Tramadol called into pharmacy

## 2016-04-21 ENCOUNTER — Encounter (HOSPITAL_COMMUNITY): Payer: Medicare HMO | Admitting: Physical Therapy

## 2016-04-29 ENCOUNTER — Encounter (HOSPITAL_COMMUNITY): Payer: Self-pay | Admitting: Emergency Medicine

## 2016-04-29 ENCOUNTER — Emergency Department (HOSPITAL_COMMUNITY)
Admission: EM | Admit: 2016-04-29 | Discharge: 2016-04-29 | Disposition: A | Discharge status: Home / Self Care | Payer: Medicare HMO | Attending: Emergency Medicine | Admitting: Emergency Medicine

## 2016-04-29 DIAGNOSIS — I129 Hypertensive chronic kidney disease with stage 1 through stage 4 chronic kidney disease, or unspecified chronic kidney disease: Secondary | ICD-10-CM | POA: Insufficient documentation

## 2016-04-29 DIAGNOSIS — R1110 Vomiting, unspecified: Secondary | ICD-10-CM | POA: Diagnosis present

## 2016-04-29 DIAGNOSIS — B349 Viral infection, unspecified: Secondary | ICD-10-CM

## 2016-04-29 DIAGNOSIS — Z87891 Personal history of nicotine dependence: Secondary | ICD-10-CM | POA: Diagnosis not present

## 2016-04-29 DIAGNOSIS — N183 Chronic kidney disease, stage 3 (moderate): Secondary | ICD-10-CM | POA: Insufficient documentation

## 2016-04-29 DIAGNOSIS — Z7982 Long term (current) use of aspirin: Secondary | ICD-10-CM | POA: Diagnosis not present

## 2016-04-29 DIAGNOSIS — E860 Dehydration: Secondary | ICD-10-CM | POA: Diagnosis not present

## 2016-04-29 DIAGNOSIS — Z79899 Other long term (current) drug therapy: Secondary | ICD-10-CM | POA: Insufficient documentation

## 2016-04-29 LAB — COMPREHENSIVE METABOLIC PANEL
ALT: 22 U/L (ref 14–54)
AST: 36 U/L (ref 15–41)
Albumin: 3.6 g/dL (ref 3.5–5.0)
Alkaline Phosphatase: 102 U/L (ref 38–126)
Anion gap: 9 (ref 5–15)
BUN: 22 mg/dL — AB (ref 6–20)
CHLORIDE: 104 mmol/L (ref 101–111)
CO2: 18 mmol/L — AB (ref 22–32)
Calcium: 9.9 mg/dL (ref 8.9–10.3)
Creatinine, Ser: 1.85 mg/dL — ABNORMAL HIGH (ref 0.44–1.00)
GFR, EST AFRICAN AMERICAN: 36 mL/min — AB (ref >60)
GFR, EST NON AFRICAN AMERICAN: 31 mL/min — AB (ref >60)
Glucose, Bld: 74 mg/dL (ref 65–99)
POTASSIUM: 4.2 mmol/L (ref 3.5–5.1)
SODIUM: 131 mmol/L — AB (ref 135–145)
Total Bilirubin: 0.9 mg/dL (ref 0.3–1.2)
Total Protein: 7 g/dL (ref 6.5–8.1)

## 2016-04-29 LAB — CBC WITH DIFFERENTIAL/PLATELET
BASOS ABS: 0.1 10*3/uL (ref 0.0–0.1)
BASOS PCT: 1 %
EOS ABS: 0.2 10*3/uL (ref 0.0–0.7)
EOS PCT: 3 %
HCT: 46 % (ref 36.0–46.0)
Hemoglobin: 14.6 g/dL (ref 12.0–15.0)
LYMPHS PCT: 44 %
Lymphs Abs: 2.9 10*3/uL (ref 0.7–4.0)
MCH: 26.2 pg (ref 26.0–34.0)
MCHC: 31.7 g/dL (ref 30.0–36.0)
MCV: 82.6 fL (ref 78.0–100.0)
MONO ABS: 0.8 10*3/uL (ref 0.1–1.0)
Monocytes Relative: 11 %
Neutro Abs: 2.7 10*3/uL (ref 1.7–7.7)
Neutrophils Relative %: 40 %
PLATELETS: 167 10*3/uL (ref 150–400)
RBC: 5.57 MIL/uL — AB (ref 3.87–5.11)
RDW: 18.1 % — AB (ref 11.5–15.5)
WBC: 6.7 10*3/uL (ref 4.0–10.5)

## 2016-04-29 LAB — RAPID STREP SCREEN (MED CTR MEBANE ONLY): STREPTOCOCCUS, GROUP A SCREEN (DIRECT): NEGATIVE

## 2016-04-29 MED ORDER — ONDANSETRON 4 MG PO TBDP: ORAL_TABLET | ORAL | 0 refills | Status: DC

## 2016-04-29 MED ORDER — SODIUM CHLORIDE 0.9 % IV BOLUS (SEPSIS)
1000.0000 mL | Freq: Once | INTRAVENOUS | Status: AC
  Administered 2016-04-29: 1000 mL via INTRAVENOUS

## 2016-04-29 MED ORDER — ONDANSETRON HCL 4 MG/2ML IJ SOLN
4.0000 mg | Freq: Once | INTRAMUSCULAR | Status: AC
  Administered 2016-04-29: 4 mg via INTRAVENOUS
  Filled 2016-04-29: qty 2

## 2016-04-29 NOTE — Discharge Instructions (Signed)
Drink plenty of fluids and follow-up with your family doctor if not improving 

## 2016-04-29 NOTE — ED Triage Notes (Signed)
Pt reports sore throat and emesis at night for last several days. Pt reports " I cant keep anything down." nad noted.

## 2016-04-29 NOTE — ED Provider Notes (Signed)
[facility] DEPT Provider Note   CSN: [id number] Arrival date & time: 04/29/16  1515  First Provider Contact:  First MD Initiated Contact with Patient 04/29/16 1525        History   Chief Complaint Chief Complaint  Patient presents with  . Emesis    HPI Summer Cook is a 50 y.o. female.  Patient states she's had a sore throat for 2-3 days and has been vomiting for couple days. Patient states she has not vomited today   The history is provided by the patient. No language interpreter was used.  Emesis   This is a new problem. The current episode started more than 2 days ago. The problem occurs 2 to 4 times per day. The problem has been resolved. The emesis has an appearance of bilious material. There has been no fever. Pertinent negatives include no abdominal pain, no chills, no cough, no diarrhea and no headaches.    Past Medical History:  Diagnosis Date  . Allergy   . Arthritis   . Asthma   . Candida esophagitis ([place])   . Chronic chest pain   . Depression   . Essential hypertension, benign   . GERD (gastroesophageal reflux disease)   . Gout   . Hemorrhoids   . 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  Last was Prednisone '40mg'$ ; 2 wk in 10/2012.  She also was on Prednisone bridged with Cellcept briefly but stopped due to lack of insurance.   . Normal cardiac stress test 02/2014  . Pulmonary embolism ([place]) 04/2012  . Renal insufficiency   . Serrated adenoma of colon   . Steroid-induced diabetes ([place])   . Thrush, oral 05/29/2011    Patient Active Problem List   Diagnosis Date Noted  . C7 radiculopathy 01/07/2016  . History of stroke   . Chronic back pain 12/23/2015  . Cerebrovascular accident (CVA) due to embolism ([place]) 12/16/2015  . Back pain 11/22/2015  . Asthma, chronic 02/16/2015  . Anxiety state 02/16/2015  . CKD (chronic kidney disease) stage 3,  GFR 30-59 ml/min 04/28/2014  . Nausea with vomiting 01/25/2013  . Abnormal LFTs 01/25/2013  . Orthostatic hypotension 01/25/2013  . Unspecified constipation 12/27/2012  . Rectal bleeding 12/26/2012  . PE (pulmonary embolism) 05/03/2012  . Tachycardia 06/04/2011  . Hemorrhoids 06/04/2011  . Steroid-induced diabetes ([place]) 06/04/2011  . 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 Immunosuppressed status ([place]) 06/04/2011  . Depression 06/04/2011  . Obesity 06/04/2011  . Hypertension 03/18/2011  . ITP (idiopathic thrombocytopenic purpura) 03/18/2011    Past Surgical History:  Procedure Laterality Date  . BONE MARROW BIOPSY    . CATARACT EXTRACTION    . CHOLECYSTECTOMY  2008  . COLONOSCOPY WITH ESOPHAGOGASTRODUODENOSCOPY (EGD) N/A 01/04/2013   Dr. Gala Romney: esophageal plaques with +KOH, hh. Gastric antrum abnormal, bx reactive gastropathy. Anal canal hemorrhoids, colonic tics, normal TI, single polyp (sessile serrated tubular adenoma). Next TCS 12/2017  . 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 TEE WITHOUT CARDIOVERSION N/A 01/03/2016   Procedure: TRANSESOPHAGEAL ECHOCARDIOGRAM (TEE) WITH PROPOFOL;  Surgeon: Satira Sark, MD;  Location: [facility];  Service: Cardiovascular;  Laterality: N/A;    OB History    No data available       Home Medications    Prior to Admission medications   Medication Sig Start Date End Date Taking? Authorizing Provider  aspirin EC 81 MG tablet Take 1 tablet (81 mg total) by mouth  daily. 01/23/16  Yes Pieter Partridge, DO  atorvastatin (LIPITOR) 40 MG tablet Take 40 mg by mouth daily.   Yes Historical Provider, MD  colestipol (COLESTID) 1 g tablet Take 1 tablet (1 g total) by mouth 2 (two) times daily before a meal. 03/16/16  Yes Manus Gunning, MD  furosemide (LASIX) 20 MG tablet Take 20-40 mg by mouth 2 (two) times daily as needed for fluid. Reported on 01/15/2016 12/16/14  Yes Historical Provider, MD  ipratropium-albuterol (DUONEB) 0.5-2.5 (3) MG/3ML SOLN Take 3 mLs by  nebulization 3 (three) times daily as needed (shorntess of breath/wheezing). 02/18/15  Yes Kathie Dike, MD  KLOR-CON 10 10 MEQ tablet Take 10 mEq by mouth as needed (Only takes with the furosemide). Reported on 01/15/2016 11/13/14  Yes Historical Provider, MD  levothyroxine (SYNTHROID, LEVOTHROID) 50 MCG tablet Take 1 tablet (50 mcg total) by mouth daily. 11/28/15  Yes Hoyt Koch, MD  metoprolol succinate (TOPROL-XL) 50 MG 24 hr tablet Take 1 tablet (50 mg total) by mouth daily. 01/24/16  Yes Satira Sark, MD  OVER THE COUNTER MEDICATION Place 1 drop into both eyes daily as needed (Dry Eyes).   Yes Historical Provider, MD  PROAIR RESPICLICK 035 (90 BASE) MCG/ACT AEPB Inhale 2 puffs into the lungs every 4 (four) hours as needed (Shortness of breath).  12/27/14  Yes Historical Provider, MD  sertraline (ZOLOFT) 50 MG tablet Take 150 mg by mouth daily. 04/19/16  Yes Historical Provider, MD  traMADol (ULTRAM) 50 MG tablet Take 1 tablet (50 mg total) by mouth 3 (three) times daily as needed. 04/17/16  Yes Ankit Lorie Phenix, MD  Na Sulfate-K Sulfate-Mg Sulf 17.5-3.13-1.6 GM/180ML SOLN Suprep-Use as directed Patient not taking: Reported on 04/29/2016 03/16/16   Manus Gunning, MD  ondansetron [facility] ODT) 4 MG disintegrating tablet '4mg'$  ODT q4 hours prn nausea/vomit 04/29/16   Milton Ferguson, MD    Family History Family History  Problem Relation Age of Onset  . Cervical cancer Mother   . Lung cancer Father   . Colon polyps Father     ???  . Pneumonia Brother   . Colon cancer Paternal Grandmother 88  . AAA (abdominal aortic aneurysm) Paternal Grandmother   . Breast cancer Paternal Grandmother   . Colon cancer Paternal Grandfather 45  . Barrett's esophagus Paternal Aunt   . Stomach cancer Neg Hx   . Pancreatic cancer Neg Hx     Social History Social History  Substance Use Topics  . Smoking status: Former Smoker    Years: 0.00    Types: Cigarettes    Quit date: 02/14/2009  .  Smokeless tobacco: Never Used  . Alcohol use No     Allergies   Brintellix [vortioxetine]; Yellow jacket venom; and Adhesive [tape]   Review of Systems Review of Systems  Constitutional: Negative for appetite change, chills and fatigue.  HENT: Negative for congestion, ear discharge and sinus pressure.        Sore throat  Eyes: Negative for discharge.  Respiratory: Negative for cough.   Cardiovascular: Negative for chest pain.  Gastrointestinal: Positive for vomiting. Negative for abdominal pain and diarrhea.  Genitourinary: Negative for frequency and hematuria.  Musculoskeletal: Negative for back pain.  Skin: Negative for rash.  Neurological: Negative for seizures and headaches.  Psychiatric/Behavioral: Negative for hallucinations.     Physical Exam Updated Vital Signs BP 105/73 (BP Location: Left Arm)   Pulse 96   Temp 97.7 F (36.5 C) (Oral)  Resp 16   Ht '5\' 4"'$  (1.626 m)   Wt 198 lb (89.8 kg)   LMP 05/13/2011   SpO2 96%   BMI 33.99 kg/m   Physical Exam  Constitutional: She is oriented to person, place, and time. She appears well-developed.  HENT:  Head: Normocephalic.  Dry mucous membranes  Eyes: Conjunctivae and EOM are normal. No scleral icterus.  Neck: Neck supple. No thyromegaly present.  Cardiovascular: Normal rate and regular rhythm.  Exam reveals no gallop and no friction rub.   No murmur heard. Pulmonary/Chest: No stridor. She has no wheezes. She has no rales. She exhibits no tenderness.  Abdominal: She exhibits no distension. There is no tenderness. There is no rebound.  Musculoskeletal: Normal range of motion. She exhibits no edema.  Lymphadenopathy:    She has no cervical adenopathy.  Neurological: She is oriented to person, place, and time. She exhibits normal muscle tone. Coordination normal.  Skin: No rash noted. No erythema.  Psychiatric: She has a normal mood and affect. Her behavior is normal.     ED Treatments / Results  Labs (all  labs ordered are listed, but only abnormal results are displayed) Labs Reviewed  CBC WITH DIFFERENTIAL/PLATELET - Abnormal; Notable for the following:       Result Value   RBC 5.57 (*)    RDW 18.1 (*)    All other components within normal limits  COMPREHENSIVE METABOLIC PANEL - Abnormal; Notable for the following:    Sodium 131 (*)    CO2 18 (*)    BUN 22 (*)    Creatinine, Ser 1.85 (*)    GFR calc non Af Amer 31 (*)    GFR calc Af Amer 36 (*)    All other components within normal limits  RAPID STREP SCREEN (NOT AT [facility])  CULTURE, GROUP A STREP [facility])    EKG  EKG Interpretation None       Radiology No results found.  Procedures Procedures (including critical care time)  Medications Ordered in ED Medications  sodium chloride 0.9 % bolus 1,000 mL (1,000 mLs Intravenous New Bag/Given 04/29/16 1556)  ondansetron (ZOFRAN) injection 4 mg (4 mg Intravenous Given 04/29/16 1557)     Initial Impression / Assessment and Plan / ED Course  I have reviewed the triage vital signs and the nursing notes.  Pertinent labs & imaging results that were available during my care of the patient were reviewed by me and considered in my medical decision making (see chart for details).  Clinical Course    Strep test negative. Patient improved with fluids and nausea medicine. Suspect viral syndrome. Labs unremarkable. Patient sent home with Zofran will follow-up with PCP  Final Clinical Impressions(s) / ED Diagnoses   Final diagnoses:  Acute viral syndrome    New Prescriptions New Prescriptions   ONDANSETRON (ZOFRAN ODT) 4 MG DISINTEGRATING TABLET    '4mg'$  ODT q4 hours prn nausea/vomit     Milton Ferguson, MD 04/29/16 1754

## 2016-04-29 NOTE — ED Notes (Signed)
Pt made aware to return if symptoms worsen or if any life threatening symptoms occur.   

## 2016-05-02 LAB — CULTURE, GROUP A STREP (THRC)

## 2016-05-11 ENCOUNTER — Ambulatory Visit (INDEPENDENT_AMBULATORY_CARE_PROVIDER_SITE_OTHER): Payer: Medicare HMO | Admitting: Orthopedic Surgery

## 2016-05-11 DIAGNOSIS — G5602 Carpal tunnel syndrome, left upper limb: Secondary | ICD-10-CM | POA: Diagnosis not present

## 2016-05-11 NOTE — Progress Notes (Signed)
Pain and paresthesias left hand for 8 months   HPI    50 year old female presents with 8 month history of pain and paresthesias of the left upper extremity. She did have a stroke with right sided symptoms which have resolved. She complains of pain swelling catching locking stiffness giving way numbness and tingling in the left hand primarily in the thumb index long and ring finger with sparing of the small finger  Previous treatment includes Aleve, tramadol and a brace for the last 6 months. She does have a history of thyroid disease no other risk factors. Review of Systems  Constitutional: Positive for malaise/fatigue and weight loss.  Respiratory: Positive for shortness of breath and wheezing.   Cardiovascular: Positive for leg swelling.  Gastrointestinal: Positive for abdominal pain and blood in stool.  Genitourinary: Positive for frequency.  Musculoskeletal: Positive for back pain and joint pain.  Skin: Negative.   Neurological: Positive for tingling and sensory change.  Psychiatric/Behavioral: Positive for depression. The patient is nervous/anxious.     Past Medical History:  Diagnosis Date  . Allergy   . Arthritis   . Asthma   . Candida esophagitis ([place])   . Chronic chest pain   . Depression   . Essential hypertension, benign   . GERD (gastroesophageal reflux disease)   . Gout   . Hemorrhoids   . 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  Last was Prednisone '40mg'$ ; 2 wk in 10/2012.  She also was on Prednisone bridged with Cellcept briefly but stopped due to lack of insurance.   . Normal cardiac stress test 02/2014  . Pulmonary embolism ([place]) 04/2012  . Renal insufficiency   . Serrated adenoma of colon   . Steroid-induced diabetes ([place])   . Thrush, oral 05/29/2011    Past Surgical History:  Procedure Laterality Date  . BONE MARROW BIOPSY    . CATARACT EXTRACTION     . CHOLECYSTECTOMY  2008  . COLONOSCOPY WITH ESOPHAGOGASTRODUODENOSCOPY (EGD) N/A 01/04/2013   Dr. Gala Romney: esophageal plaques with +KOH, hh. Gastric antrum abnormal, bx reactive gastropathy. Anal canal hemorrhoids, colonic tics, normal TI, single polyp (sessile serrated tubular adenoma). Next TCS 12/2017  . 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 TEE WITHOUT CARDIOVERSION N/A 01/03/2016   Procedure: TRANSESOPHAGEAL ECHOCARDIOGRAM (TEE) WITH PROPOFOL;  Surgeon: Satira Sark, MD;  Location: [facility];  Service: Cardiovascular;  Laterality: N/A;   Family History  Problem Relation Age of Onset  . Cervical cancer Mother   . Lung cancer Father   . Colon polyps Father     ???  . Pneumonia Brother   . Colon cancer Paternal Grandmother 88  . AAA (abdominal aortic aneurysm) Paternal Grandmother   . Breast cancer Paternal Grandmother   . Colon cancer Paternal Grandfather 73  . Barrett's esophagus Paternal Aunt   . Stomach cancer Neg Hx   . Pancreatic cancer Neg Hx    Social History  Substance Use Topics  . Smoking status: Former Smoker    Years: 0.00    Types: Cigarettes    Quit date: 02/14/2009  . Smokeless tobacco: Never Used  . Alcohol use No    Current Outpatient Prescriptions:  .  aspirin EC 81 MG tablet, Take 1 tablet (81 mg total) by mouth daily., Disp: 30 tablet, Rfl: 6 .  atorvastatin (LIPITOR) 40 MG tablet, Take 40 mg by mouth daily., Disp: , Rfl:  .  colestipol (COLESTID) 1  g tablet, Take 1 tablet (1 g total) by mouth 2 (two) times daily before a meal., Disp: 60 tablet, Rfl: 3 .  furosemide (LASIX) 20 MG tablet, Take 20-40 mg by mouth 2 (two) times daily as needed for fluid. Reported on 01/15/2016, Disp: , Rfl: 2 .  ipratropium-albuterol (DUONEB) 0.5-2.5 (3) MG/3ML SOLN, Take 3 mLs by nebulization 3 (three) times daily as needed (shorntess of breath/wheezing)., Disp: 360 mL, Rfl: 0 .  KLOR-CON 10 10 MEQ tablet, Take 10 mEq by mouth as needed (Only takes with the furosemide). Reported on  01/15/2016, Disp: , Rfl: 2 .  levothyroxine (SYNTHROID, LEVOTHROID) 50 MCG tablet, Take 1 tablet (50 mcg total) by mouth daily., Disp: 90 tablet, Rfl: 3 .  metoprolol succinate (TOPROL-XL) 50 MG 24 hr tablet, Take 1 tablet (50 mg total) by mouth daily., Disp: 30 tablet, Rfl: 6 .  Na Sulfate-K Sulfate-Mg Sulf 17.5-3.13-1.6 GM/180ML SOLN, Suprep-Use as directed (Patient not taking: Reported on 04/29/2016), Disp: 354 mL, Rfl: 0 .  ondansetron (ZOFRAN ODT) 4 MG disintegrating tablet, '4mg'$  ODT q4 hours prn nausea/vomit, Disp: 12 tablet, Rfl: 0 .  OVER THE COUNTER MEDICATION, Place 1 drop into both eyes daily as needed (Dry Eyes)., Disp: , Rfl:  .  PROAIR RESPICLICK 696 (90 BASE) MCG/ACT AEPB, Inhale 2 puffs into the lungs every 4 (four) hours as needed (Shortness of breath). , Disp: , Rfl: 3 .  sertraline (ZOLOFT) 50 MG tablet, Take 150 mg by mouth daily., Disp: , Rfl:  .  traMADol (ULTRAM) 50 MG tablet, Take 1 tablet (50 mg total) by mouth 3 (three) times daily as needed., Disp: 90 tablet, Rfl: 0  LMP 05/13/2011   Physical Exam  Constitutional: She is oriented to person, place, and time. She appears well-developed and well-nourished. No distress.  Cardiovascular: Normal rate and intact distal pulses.   Neurological: She is alert and oriented to person, place, and time. She has normal reflexes. She exhibits normal muscle tone. Coordination normal.  Skin: Skin is warm and dry. No rash noted. She is not diaphoretic. No erythema. No pallor.  Psychiatric: She has a normal mood and affect. Her behavior is normal. Judgment and thought content normal.    Ortho Exam  Left hand is showing definite symptoms of carpal tunnel with decreased sensation thumb index long ring finger sparing small finger. She has full range of motion in the wrist and the wrist is stable. Grip strength is stronger left and right skin is intact with no nodules or rashes. Pulses are good temperature is normal lymph nodes in the  epitrochlear region are negative sensory changes as described reflexes are 2+  On the right side she has mild loss of sensation in the median nerve distribution and good grip strength.  ASSESSMENT: There is a nerve study that we can purview but she basically has left carpal tunnel syndrome she's failed conservative management she wants to have surgery    PLAN Left carpal tunnel release  This procedure has been fully reviewed with the patient and written informed consent has been obtained.   Arther Abbott, MD 05/11/2016 3:25 PM

## 2016-05-11 NOTE — Patient Instructions (Addendum)
Carpal Tunnel Release Carpal tunnel release is a surgical procedure to relieve numbness and pain in your hand that are caused by carpal tunnel syndrome. Your carpal tunnel is a narrow, hollow space in your wrist. It passes between your wrist bones and a band of connective tissue (transverse carpal ligament). The nerve that supplies most of your hand (median nerve) passes through this space, and so do the connections between your fingers and the muscles of your arm (tendons). Carpal tunnel syndrome makes this space swell and become narrow, and this causes pain and numbness. In carpal tunnel release surgery, a surgeon cuts through the transverse carpal ligament to make more room in the carpal tunnel space. You may have this surgery if other types of treatment have not worked. LET [facility] CARE PROVIDER KNOW ABOUT:  Any allergies you have.  All medicines you are taking, including vitamins, herbs, eye drops, creams, and over-the-counter medicines.  Previous problems you or members of your family have had with the use of anesthetics.  Any blood disorders you have.  Previous surgeries you have had.  Medical conditions you have. RISKS AND COMPLICATIONS Generally, this is a safe procedure. However, problems may occur, including:  Bleeding.  Infection.  Injury to the median nerve.  Need for additional surgery. BEFORE THE PROCEDURE  Ask your health care provider about:  Changing or stopping your regular medicines. This is especially important if you are taking diabetes medicines or blood thinners.  Taking medicines such as aspirin and ibuprofen. These medicines can thin your blood. Do not take these medicines before your procedure if your health care provider instructs you not to.  Do not eat or drink anything after midnight on the night before the procedure or as directed by your health care provider.  Plan to have someone take you home after the procedure. PROCEDURE  An IV tube may  be inserted into a vein.  You will be given one of the following:  A medicine that numbs the wrist area (local anesthetic). You may also be given a medicine to make you relax (sedative).  A medicine that makes you go to sleep (general anesthetic).  Your arm, hand, and wrist will be cleaned with a germ-killing solution (antiseptic).  Your surgeon will make a surgical cut (incision) over the palm side of your wrist. The surgeon will pull aside the skin of your wrist to expose the carpal tunnel space.  The surgeon will cut the transverse carpal ligament.  The edges of the incision will be closed with stitches (sutures) or staples.  A bandage (dressing) will be placed over your wrist and wrapped around your hand and wrist. AFTER THE PROCEDURE  You may spend some time in a recovery area.  Your blood pressure, heart rate, breathing rate, and blood oxygen level will be monitored often until the medicines you were given have worn off.  You will likely have some pain. You will be given pain medicine.  You may need to wear a splint or a wrist brace over your dressing.   This information is not intended to replace advice given to you by your health care provider. Make sure you discuss any questions you have with your health care provider.   Document Released: 12/05/2003 Document Revised: 10/05/2014 Document Reviewed: 05/02/2014 Elsevier Interactive Patient Education [employer].   Surgery will be scheduled

## 2016-05-12 ENCOUNTER — Other Ambulatory Visit: Payer: Self-pay | Admitting: *Deleted

## 2016-05-13 ENCOUNTER — Encounter: Payer: Medicare HMO | Attending: Physical Medicine & Rehabilitation | Admitting: Physical Medicine & Rehabilitation

## 2016-05-13 DIAGNOSIS — K219 Gastro-esophageal reflux disease without esophagitis: Secondary | ICD-10-CM | POA: Insufficient documentation

## 2016-05-13 DIAGNOSIS — F329 Major depressive disorder, single episode, unspecified: Secondary | ICD-10-CM | POA: Insufficient documentation

## 2016-05-13 DIAGNOSIS — M2578 Osteophyte, vertebrae: Secondary | ICD-10-CM | POA: Insufficient documentation

## 2016-05-13 DIAGNOSIS — K649 Unspecified hemorrhoids: Secondary | ICD-10-CM | POA: Insufficient documentation

## 2016-05-13 DIAGNOSIS — Z86711 Personal history of pulmonary embolism: Secondary | ICD-10-CM | POA: Insufficient documentation

## 2016-05-13 DIAGNOSIS — B3781 Candidal esophagitis: Secondary | ICD-10-CM | POA: Insufficient documentation

## 2016-05-13 DIAGNOSIS — M47816 Spondylosis without myelopathy or radiculopathy, lumbar region: Secondary | ICD-10-CM | POA: Insufficient documentation

## 2016-05-13 DIAGNOSIS — Z8673 Personal history of transient ischemic attack (TIA), and cerebral infarction without residual deficits: Secondary | ICD-10-CM | POA: Insufficient documentation

## 2016-05-13 DIAGNOSIS — M47817 Spondylosis without myelopathy or radiculopathy, lumbosacral region: Secondary | ICD-10-CM | POA: Insufficient documentation

## 2016-05-13 DIAGNOSIS — J45909 Unspecified asthma, uncomplicated: Secondary | ICD-10-CM | POA: Insufficient documentation

## 2016-05-13 DIAGNOSIS — B379 Candidiasis, unspecified: Secondary | ICD-10-CM | POA: Insufficient documentation

## 2016-05-13 DIAGNOSIS — M109 Gout, unspecified: Secondary | ICD-10-CM | POA: Insufficient documentation

## 2016-05-13 DIAGNOSIS — N289 Disorder of kidney and ureter, unspecified: Secondary | ICD-10-CM | POA: Insufficient documentation

## 2016-05-13 DIAGNOSIS — M545 Low back pain: Secondary | ICD-10-CM | POA: Insufficient documentation

## 2016-05-13 DIAGNOSIS — I10 Essential (primary) hypertension: Secondary | ICD-10-CM | POA: Insufficient documentation

## 2016-05-13 DIAGNOSIS — E098 Drug or chemical induced diabetes mellitus with unspecified complications: Secondary | ICD-10-CM | POA: Insufficient documentation

## 2016-05-13 DIAGNOSIS — N189 Chronic kidney disease, unspecified: Secondary | ICD-10-CM | POA: Insufficient documentation

## 2016-05-15 NOTE — Patient Instructions (Addendum)
Summer Cook  05/15/2016     @PREFPERIOPPHARMACY @   Your procedure is scheduled on 05/20/2016.  Report to [facility] at 7:00 A.M.  Call this number if you have problems the morning of surgery:  [phone]   Remember:  Do not eat food or drink liquids after midnight.  Take these medicines the morning of surgery with A SIP OF WATER :  Klor-con, Synthroid, Toprol, Zoloft, and Ultram.  Please use your DuoNeb prior to leaving home the morning of surgery.   Do not wear jewelry, make-up or nail polish.  Do not wear lotions, powders, or perfumes.  You may wear deoderant.  Do not shave 48 hours prior to surgery.  Men may shave face and neck.  Do not bring valuables to the hospital.  [facility] is not responsible for any belongings or valuables.  Contacts, dentures or bridgework may not be worn into surgery.  Leave your suitcase in the car.  After surgery it may be brought to your room.  For patients admitted to the hospital, discharge time will be determined by your treatment team.  Patients discharged the day of surgery will not be allowed to drive home.   Name and phone number of your driver:   family Special instructions:  n/A  Please read over the following fact sheets that you were given. Care and Recovery After Surgery  General Anesthesia, Adult General anesthesia is a sleep-like state of non-feeling produced by medicines (anesthetics). General anesthesia prevents you from being alert and feeling pain during a medical procedure. Your caregiver may recommend general anesthesia if your procedure:  Is long.  Is painful or uncomfortable.  Would be frightening to see or hear.  Requires you to be still.  Affects your breathing.  Causes significant blood loss. LET YOUR CAREGIVER KNOW ABOUT:  Allergies to food or medicine.  Medicines taken, including vitamins, herbs, eyedrops, over-the-counter medicines, and creams.  Use of steroids (by mouth or creams).  Previous  problems with anesthetics or numbing medicines, including problems experienced by relatives.  History of bleeding problems or blood clots.  Previous surgeries and types of anesthetics received.  Possibility of pregnancy, if this applies.  Use of cigarettes, alcohol, or illegal drugs.  Any health condition(s), especially diabetes, sleep apnea, and high blood pressure. RISKS AND COMPLICATIONS General anesthesia rarely causes complications. However, if complications do occur, they can be life threatening. Complications include:  A lung infection.  A stroke.  A heart attack.  Waking up during the procedure. When this occurs, the patient may be unable to move and communicate that he or she is awake. The patient may feel severe pain. Older adults and adults with serious medical problems are more likely to have complications than adults who are young and healthy. Some complications can be prevented by answering all of your caregiver's questions thoroughly and by following all pre-procedure instructions. It is important to tell your caregiver if any of the pre-procedure instructions, especially those related to diet, were not followed. Any food or liquid in the stomach can cause problems when you are under general anesthesia. BEFORE THE PROCEDURE  Ask your caregiver if you will have to spend the night at the hospital. If you will not have to spend the night, arrange to have an adult drive you and stay with you for 24 hours.  Follow your caregiver's instructions if you are taking dietary supplements or medicines. Your caregiver may tell you to stop taking them or to reduce your  dosage.  Do not smoke for as long as possible before your procedure. If possible, stop smoking 3-6 weeks before the procedure.  Do not take new dietary supplements or medicines within 1 week of your procedure unless your caregiver approves them.  Do not eat within 8 hours of your procedure or as directed by your  caregiver. Drink only clear liquids, such as water, black coffee (without milk or cream), and fruit juices (without pulp).  Do not drink within 3 hours of your procedure or as directed by your caregiver.  You may brush your teeth on the morning of the procedure, but make sure to spit out the toothpaste and water when finished. PROCEDURE  You will receive anesthetics through a mask, through an intravenous (IV) access tube, or through both. A doctor who specializes in anesthesia (anesthesiologist) or a nurse who specializes in anesthesia (nurse anesthetist) or both will stay with you throughout the procedure to make sure you remain unconscious. He or she will also watch your blood pressure, pulse, and oxygen levels to make sure that the anesthetics do not cause any problems. Once you are asleep, a breathing tube or mask may be used to help you breathe. AFTER THE PROCEDURE You will wake up after the procedure is complete. You may be in the room where the procedure was performed or in a recovery area. You may have a sore throat if a breathing tube was used. You may also feel:  Dizzy.  Weak.  Drowsy.  Confused.  Nauseous.  Cold. These are all normal responses and can be expected to last for up to 24 hours after the procedure is complete. A caregiver will tell you when you are ready to go home. This will usually be when you are fully awake and in stable condition.   This information is not intended to replace advice given to you by your health care provider. Make sure you discuss any questions you have with your health care provider.   Document Released: 12/22/2007 Document Revised: 10/05/2014 Document Reviewed: 01/13/2012 Elsevier Interactive Patient Education 2016 [place] Tunnel Release Carpal tunnel release is a surgical procedure to relieve numbness and pain in your hand that are caused by carpal tunnel syndrome. Your carpal tunnel is a narrow, hollow space in your wrist. It  passes between your wrist bones and a band of connective tissue (transverse carpal ligament). The nerve that supplies most of your hand (median nerve) passes through this space, and so do the connections between your fingers and the muscles of your arm (tendons). Carpal tunnel syndrome makes this space swell and become narrow, and this causes pain and numbness. In carpal tunnel release surgery, a surgeon cuts through the transverse carpal ligament to make more room in the carpal tunnel space. You may have this surgery if other types of treatment have not worked. LET [facility] CARE PROVIDER KNOW ABOUT:  Any allergies you have.  All medicines you are taking, including vitamins, herbs, eye drops, creams, and over-the-counter medicines.  Previous problems you or members of your family have had with the use of anesthetics.  Any blood disorders you have.  Previous surgeries you have had.  Medical conditions you have. RISKS AND COMPLICATIONS Generally, this is a safe procedure. However, problems may occur, including:  Bleeding.  Infection.  Injury to the median nerve.  Need for additional surgery. BEFORE THE PROCEDURE  Ask your health care provider about:  Changing or stopping your regular medicines. This is especially important  if you are taking diabetes medicines or blood thinners.  Taking medicines such as aspirin and ibuprofen. These medicines can thin your blood. Do not take these medicines before your procedure if your health care provider instructs you not to.  Do not eat or drink anything after midnight on the night before the procedure or as directed by your health care provider.  Plan to have someone take you home after the procedure. PROCEDURE  An IV tube may be inserted into a vein.  You will be given one of the following:  A medicine that numbs the wrist area (local anesthetic). You may also be given a medicine to make you relax (sedative).  A medicine that makes  you go to sleep (general anesthetic).  Your arm, hand, and wrist will be cleaned with a germ-killing solution (antiseptic).  Your surgeon will make a surgical cut (incision) over the palm side of your wrist. The surgeon will pull aside the skin of your wrist to expose the carpal tunnel space.  The surgeon will cut the transverse carpal ligament.  The edges of the incision will be closed with stitches (sutures) or staples.  A bandage (dressing) will be placed over your wrist and wrapped around your hand and wrist. AFTER THE PROCEDURE  You may spend some time in a recovery area.  Your blood pressure, heart rate, breathing rate, and blood oxygen level will be monitored often until the medicines you were given have worn off.  You will likely have some pain. You will be given pain medicine.  You may need to wear a splint or a wrist brace over your dressing.   This information is not intended to replace advice given to you by your health care provider. Make sure you discuss any questions you have with your health care provider.   Document Released: 12/05/2003 Document Revised: 10/05/2014 Document Reviewed: 05/02/2014 Elsevier Interactive Patient Education [employer].

## 2016-05-18 ENCOUNTER — Encounter (HOSPITAL_COMMUNITY)
Admission: RE | Admit: 2016-05-18 | Discharge: 2016-05-18 | Disposition: A | Discharge status: Home / Self Care | Payer: Medicare HMO | Source: Ambulatory Visit | Attending: Orthopedic Surgery | Admitting: Orthopedic Surgery

## 2016-05-18 ENCOUNTER — Encounter (HOSPITAL_COMMUNITY): Payer: Self-pay

## 2016-05-18 DIAGNOSIS — J45909 Unspecified asthma, uncomplicated: Secondary | ICD-10-CM | POA: Diagnosis not present

## 2016-05-18 DIAGNOSIS — Z9081 Acquired absence of spleen: Secondary | ICD-10-CM | POA: Diagnosis not present

## 2016-05-18 DIAGNOSIS — Z79899 Other long term (current) drug therapy: Secondary | ICD-10-CM | POA: Diagnosis not present

## 2016-05-18 DIAGNOSIS — K219 Gastro-esophageal reflux disease without esophagitis: Secondary | ICD-10-CM | POA: Diagnosis not present

## 2016-05-18 DIAGNOSIS — Z6832 Body mass index (BMI) 32.0-32.9, adult: Secondary | ICD-10-CM | POA: Diagnosis not present

## 2016-05-18 DIAGNOSIS — Z87891 Personal history of nicotine dependence: Secondary | ICD-10-CM | POA: Diagnosis not present

## 2016-05-18 DIAGNOSIS — E669 Obesity, unspecified: Secondary | ICD-10-CM | POA: Diagnosis not present

## 2016-05-18 DIAGNOSIS — E0951 Drug or chemical induced diabetes mellitus with diabetic peripheral angiopathy without gangrene: Secondary | ICD-10-CM | POA: Diagnosis not present

## 2016-05-18 DIAGNOSIS — T380X5A Adverse effect of glucocorticoids and synthetic analogues, initial encounter: Secondary | ICD-10-CM | POA: Diagnosis not present

## 2016-05-18 DIAGNOSIS — Z86711 Personal history of pulmonary embolism: Secondary | ICD-10-CM | POA: Diagnosis not present

## 2016-05-18 DIAGNOSIS — R69 Illness, unspecified: Secondary | ICD-10-CM | POA: Diagnosis not present

## 2016-05-18 DIAGNOSIS — G5602 Carpal tunnel syndrome, left upper limb: Secondary | ICD-10-CM | POA: Diagnosis not present

## 2016-05-18 DIAGNOSIS — F329 Major depressive disorder, single episode, unspecified: Secondary | ICD-10-CM | POA: Diagnosis not present

## 2016-05-18 DIAGNOSIS — I10 Essential (primary) hypertension: Secondary | ICD-10-CM | POA: Diagnosis not present

## 2016-05-18 DIAGNOSIS — Z8673 Personal history of transient ischemic attack (TIA), and cerebral infarction without residual deficits: Secondary | ICD-10-CM | POA: Diagnosis not present

## 2016-05-18 DIAGNOSIS — Z7982 Long term (current) use of aspirin: Secondary | ICD-10-CM | POA: Diagnosis not present

## 2016-05-18 DIAGNOSIS — F419 Anxiety disorder, unspecified: Secondary | ICD-10-CM | POA: Diagnosis not present

## 2016-05-18 LAB — BASIC METABOLIC PANEL
Anion gap: 10 (ref 5–15)
BUN: 24 mg/dL — ABNORMAL HIGH (ref 6–20)
CALCIUM: 9.3 mg/dL (ref 8.9–10.3)
CO2: 19 mmol/L — AB (ref 22–32)
CREATININE: 1.82 mg/dL — AB (ref 0.44–1.00)
Chloride: 105 mmol/L (ref 101–111)
GFR calc non Af Amer: 31 mL/min — ABNORMAL LOW (ref >60)
GFR, EST AFRICAN AMERICAN: 36 mL/min — AB (ref >60)
Glucose, Bld: 102 mg/dL — ABNORMAL HIGH (ref 65–99)
Potassium: 3.8 mmol/L (ref 3.5–5.1)
SODIUM: 134 mmol/L — AB (ref 135–145)

## 2016-05-18 LAB — CBC
HEMATOCRIT: 48.7 % — AB (ref 36.0–46.0)
Hemoglobin: 15.9 g/dL — ABNORMAL HIGH (ref 12.0–15.0)
MCH: 27.2 pg (ref 26.0–34.0)
MCHC: 32.6 g/dL (ref 30.0–36.0)
MCV: 83.4 fL (ref 78.0–100.0)
PLATELETS: 116 10*3/uL — AB (ref 150–400)
RBC: 5.84 MIL/uL — ABNORMAL HIGH (ref 3.87–5.11)
RDW: 17.6 % — AB (ref 11.5–15.5)
WBC: 8 10*3/uL (ref 4.0–10.5)

## 2016-05-19 ENCOUNTER — Encounter (HOSPITAL_COMMUNITY): Payer: Self-pay | Admitting: Anesthesiology

## 2016-05-19 NOTE — Anesthesia Preprocedure Evaluation (Addendum)
Anesthesia Evaluation  Patient identified by MRN, date of birth, ID band Patient awake    Reviewed: Allergy & Precautions, NPO status , Patient's Chart, lab work & pertinent test results, reviewed documented beta blocker date and time   Airway Mallampati: III  TM Distance: >3 FB Neck ROM: Full    Dental no notable dental hx. (+) Teeth Intact   Pulmonary asthma , former smoker,    Pulmonary exam normal breath sounds clear to auscultation       Cardiovascular hypertension, Pt. on medications and Pt. on home beta blockers + Peripheral Vascular Disease  Normal cardiovascular exam Rhythm:Regular Rate:Normal  EKG- 12/17/2015- NSR Anteroseptal infarct  Echo- 02/28/2013 Left ventricle: The cavity size was normal. Wall thickness was normal. Systolic function was normal. The estimated ejection fraction was in the range of 55% to 60%. Wall motion was normal; there were no regional wall motion abnormalities. Doppler parameters are consistent with abnormal left ventricular relaxation (grade 1 diastolic dysfunction). - Mitral valve: Mild regurgitation.  Hx/o SVT  Stress test 03/20/2014- normal   Neuro/Psych PSYCHIATRIC DISORDERS Anxiety Depression  Neuromuscular disease CVA, No Residual Symptoms    GI/Hepatic GERD  Medicated and Controlled,  Endo/Other  diabetes, Well Controlled, Type 2Steroid induced DM Obesity  Renal/GU Renal InsufficiencyRenal disease  negative genitourinary   Musculoskeletal  (+) Arthritis , Left CTS Gout Chronic back pain C7 radiculopathy   Abdominal (+) + obese,   Peds  Hematology Hx/o ITP S/P Splenectomy Hx/o PTE   Anesthesia Other Findings   Reproductive/Obstetrics negative OB ROS                            Anesthesia Physical Anesthesia Plan  ASA: III  Anesthesia Plan: Bier Block   Post-op Pain Management:    Induction: Intravenous  Airway Management  Planned: Natural Airway and Nasal Cannula  Additional Equipment:   Intra-op Plan:   Post-operative Plan:   Informed Consent: I have reviewed the patients History and Physical, chart, labs and discussed the procedure including the risks, benefits and alternatives for the proposed anesthesia with the patient or authorized representative who has indicated his/her understanding and acceptance.   Dental advisory given  Plan Discussed with: CRNA, Anesthesiologist and Surgeon  Anesthesia Plan Comments:         Anesthesia Quick Evaluation

## 2016-05-20 ENCOUNTER — Encounter (HOSPITAL_COMMUNITY): Payer: Self-pay | Admitting: *Deleted

## 2016-05-20 ENCOUNTER — Ambulatory Visit (HOSPITAL_COMMUNITY): Payer: Medicare HMO | Admitting: Anesthesiology

## 2016-05-20 ENCOUNTER — Ambulatory Visit (HOSPITAL_COMMUNITY)
Admission: RE | Admit: 2016-05-20 | Discharge: 2016-05-20 | Disposition: A | Discharge status: Home / Self Care | Payer: Medicare HMO | Source: Ambulatory Visit | Attending: Orthopedic Surgery | Admitting: Orthopedic Surgery

## 2016-05-20 ENCOUNTER — Encounter (HOSPITAL_COMMUNITY)
Admission: RE | Disposition: A | Discharge status: Home / Self Care | Payer: Self-pay | Source: Ambulatory Visit | Attending: Orthopedic Surgery

## 2016-05-20 DIAGNOSIS — Z8673 Personal history of transient ischemic attack (TIA), and cerebral infarction without residual deficits: Secondary | ICD-10-CM | POA: Insufficient documentation

## 2016-05-20 DIAGNOSIS — J45909 Unspecified asthma, uncomplicated: Secondary | ICD-10-CM | POA: Insufficient documentation

## 2016-05-20 DIAGNOSIS — Z87891 Personal history of nicotine dependence: Secondary | ICD-10-CM | POA: Insufficient documentation

## 2016-05-20 DIAGNOSIS — G5602 Carpal tunnel syndrome, left upper limb: Secondary | ICD-10-CM

## 2016-05-20 DIAGNOSIS — Z9081 Acquired absence of spleen: Secondary | ICD-10-CM | POA: Insufficient documentation

## 2016-05-20 DIAGNOSIS — F329 Major depressive disorder, single episode, unspecified: Secondary | ICD-10-CM | POA: Insufficient documentation

## 2016-05-20 DIAGNOSIS — T380X5A Adverse effect of glucocorticoids and synthetic analogues, initial encounter: Secondary | ICD-10-CM | POA: Diagnosis not present

## 2016-05-20 DIAGNOSIS — Z6832 Body mass index (BMI) 32.0-32.9, adult: Secondary | ICD-10-CM | POA: Insufficient documentation

## 2016-05-20 DIAGNOSIS — R69 Illness, unspecified: Secondary | ICD-10-CM | POA: Diagnosis not present

## 2016-05-20 DIAGNOSIS — E669 Obesity, unspecified: Secondary | ICD-10-CM | POA: Insufficient documentation

## 2016-05-20 DIAGNOSIS — Z7982 Long term (current) use of aspirin: Secondary | ICD-10-CM | POA: Insufficient documentation

## 2016-05-20 DIAGNOSIS — I10 Essential (primary) hypertension: Secondary | ICD-10-CM | POA: Diagnosis not present

## 2016-05-20 DIAGNOSIS — K219 Gastro-esophageal reflux disease without esophagitis: Secondary | ICD-10-CM | POA: Diagnosis not present

## 2016-05-20 DIAGNOSIS — Z79899 Other long term (current) drug therapy: Secondary | ICD-10-CM | POA: Insufficient documentation

## 2016-05-20 DIAGNOSIS — E0951 Drug or chemical induced diabetes mellitus with diabetic peripheral angiopathy without gangrene: Secondary | ICD-10-CM | POA: Diagnosis not present

## 2016-05-20 DIAGNOSIS — F419 Anxiety disorder, unspecified: Secondary | ICD-10-CM | POA: Insufficient documentation

## 2016-05-20 DIAGNOSIS — Z86711 Personal history of pulmonary embolism: Secondary | ICD-10-CM | POA: Insufficient documentation

## 2016-05-20 HISTORY — PX: CARPAL TUNNEL RELEASE: SHX101

## 2016-05-20 SURGERY — CARPAL TUNNEL RELEASE
Anesthesia: Regional | Site: Hand | Laterality: Left

## 2016-05-20 MED ORDER — PROPOFOL 10 MG/ML IV BOLUS
INTRAVENOUS | Status: AC
  Filled 2016-05-20: qty 20

## 2016-05-20 MED ORDER — CEFAZOLIN SODIUM-DEXTROSE 2-4 GM/100ML-% IV SOLN
INTRAVENOUS | Status: AC
  Filled 2016-05-20: qty 100

## 2016-05-20 MED ORDER — LIDOCAINE HCL (PF) 0.5 % IJ SOLN
INTRAMUSCULAR | Status: AC
  Filled 2016-05-20: qty 50

## 2016-05-20 MED ORDER — FENTANYL CITRATE (PF) 100 MCG/2ML IJ SOLN: 25.0000 ug | INTRAMUSCULAR | Status: DC | PRN

## 2016-05-20 MED ORDER — MEPERIDINE HCL 50 MG/ML IJ SOLN: 6.2500 mg | INTRAMUSCULAR | Status: DC | PRN

## 2016-05-20 MED ORDER — CHLORHEXIDINE GLUCONATE 4 % EX LIQD: 60.0000 mL | Freq: Once | CUTANEOUS | Status: DC

## 2016-05-20 MED ORDER — MIDAZOLAM HCL 2 MG/2ML IJ SOLN
INTRAMUSCULAR | Status: AC
  Filled 2016-05-20: qty 2

## 2016-05-20 MED ORDER — PROPOFOL 500 MG/50ML IV EMUL
INTRAVENOUS | Status: DC | PRN
  Administered 2016-05-20: 09:00:00 via INTRAVENOUS
  Administered 2016-05-20: 75 ug/kg/min via INTRAVENOUS

## 2016-05-20 MED ORDER — 0.9 % SODIUM CHLORIDE (POUR BTL) OPTIME
TOPICAL | Status: DC | PRN
  Administered 2016-05-20: 300 mL

## 2016-05-20 MED ORDER — MIDAZOLAM HCL 5 MG/5ML IJ SOLN
INTRAMUSCULAR | Status: DC | PRN
  Administered 2016-05-20 (×2): 2 mg via INTRAVENOUS

## 2016-05-20 MED ORDER — METOCLOPRAMIDE HCL 5 MG/ML IJ SOLN: 10.0000 mg | Freq: Once | INTRAMUSCULAR | Status: DC | PRN

## 2016-05-20 MED ORDER — HYDROCODONE-ACETAMINOPHEN 7.5-325 MG PO TABS: 1.0000 | ORAL_TABLET | Freq: Once | ORAL | Status: DC | PRN

## 2016-05-20 MED ORDER — BUPIVACAINE HCL (PF) 0.5 % IJ SOLN
INTRAMUSCULAR | Status: AC
  Filled 2016-05-20: qty 30

## 2016-05-20 MED ORDER — LACTATED RINGERS IV SOLN
INTRAVENOUS | Status: DC
  Administered 2016-05-20: 1000 mL via INTRAVENOUS

## 2016-05-20 MED ORDER — LIDOCAINE HCL (PF) 0.5 % IJ SOLN
INTRAMUSCULAR | Status: DC | PRN
  Administered 2016-05-20: 250 mg via INTRAVENOUS

## 2016-05-20 MED ORDER — OXYCODONE-ACETAMINOPHEN 5-325 MG PO TABS: 1.0000 | ORAL_TABLET | ORAL | 0 refills | Status: DC | PRN

## 2016-05-20 MED ORDER — CEFAZOLIN SODIUM-DEXTROSE 2-4 GM/100ML-% IV SOLN
2.0000 g | INTRAVENOUS | Status: AC
  Administered 2016-05-20: 2 g via INTRAVENOUS

## 2016-05-20 MED ORDER — FENTANYL CITRATE (PF) 100 MCG/2ML IJ SOLN
INTRAMUSCULAR | Status: AC
  Filled 2016-05-20: qty 2

## 2016-05-20 MED ORDER — FENTANYL CITRATE (PF) 100 MCG/2ML IJ SOLN
INTRAMUSCULAR | Status: DC | PRN
  Administered 2016-05-20 (×2): 25 ug via INTRAVENOUS

## 2016-05-20 MED ORDER — BUPIVACAINE HCL (PF) 0.5 % IJ SOLN
INTRAMUSCULAR | Status: DC | PRN
  Administered 2016-05-20: 10 mL

## 2016-05-20 SURGICAL SUPPLY — 41 items
BAG HAMPER (MISCELLANEOUS) ×2 IMPLANT
BANDAGE ELASTIC 3 LF NS (GAUZE/BANDAGES/DRESSINGS) ×2 IMPLANT
BANDAGE ESMARK 4X12 BL STRL LF (DISPOSABLE) ×1 IMPLANT
BLADE SURG 15 STRL LF DISP TIS (BLADE) ×1 IMPLANT
BLADE SURG 15 STRL SS (BLADE) ×2
BNDG CMPR 12X4 ELC STRL LF (DISPOSABLE) ×1
BNDG CMPR MED 5X3 ELC HKLP NS (GAUZE/BANDAGES/DRESSINGS) ×1
BNDG COHESIVE 4X5 TAN STRL (GAUZE/BANDAGES/DRESSINGS) ×2 IMPLANT
BNDG ESMARK 4X12 BLUE STRL LF (DISPOSABLE) ×2
BNDG GAUZE ELAST 4 BULKY (GAUZE/BANDAGES/DRESSINGS) ×1 IMPLANT
CHLORAPREP W/TINT 26ML (MISCELLANEOUS) ×2 IMPLANT
CLOTH BEACON ORANGE TIMEOUT ST (SAFETY) ×2 IMPLANT
COVER LIGHT HANDLE STERIS (MISCELLANEOUS) ×4 IMPLANT
CUFF TOURNIQUET SINGLE 18IN (TOURNIQUET CUFF) ×2 IMPLANT
DECANTER SPIKE VIAL GLASS SM (MISCELLANEOUS) ×2 IMPLANT
DRAPE PROXIMA HALF (DRAPES) ×2 IMPLANT
DRSG XEROFORM 1X8 (GAUZE/BANDAGES/DRESSINGS) ×1 IMPLANT
ELECT NDL TIP 2.8 STRL (NEEDLE) ×1 IMPLANT
ELECT NEEDLE TIP 2.8 STRL (NEEDLE) ×2 IMPLANT
ELECT REM PT RETURN 9FT ADLT (ELECTROSURGICAL) ×2
ELECTRODE REM PT RTRN 9FT ADLT (ELECTROSURGICAL) ×1 IMPLANT
GAUZE SPONGE 4X4 12PLY STRL (GAUZE/BANDAGES/DRESSINGS) ×1 IMPLANT
GLOVE BIOGEL PI IND STRL 7.0 (GLOVE) ×1 IMPLANT
GLOVE BIOGEL PI INDICATOR 7.0 (GLOVE) ×1
GLOVE SKINSENSE NS SZ8.0 LF (GLOVE) ×1
GLOVE SKINSENSE STRL SZ8.0 LF (GLOVE) ×1 IMPLANT
GLOVE SS N UNI LF 8.5 STRL (GLOVE) ×2 IMPLANT
GOWN STRL REUS W/ TWL LRG LVL3 (GOWN DISPOSABLE) ×1 IMPLANT
GOWN STRL REUS W/TWL LRG LVL3 (GOWN DISPOSABLE) ×2
GOWN STRL REUS W/TWL XL LVL3 (GOWN DISPOSABLE) ×2 IMPLANT
HAND ALUMI XLG (SOFTGOODS) ×2 IMPLANT
KIT ROOM TURNOVER APOR (KITS) ×2 IMPLANT
MANIFOLD NEPTUNE II (INSTRUMENTS) ×2 IMPLANT
NDL HYPO 21X1.5 SAFETY (NEEDLE) ×1 IMPLANT
NEEDLE HYPO 21X1.5 SAFETY (NEEDLE) ×2 IMPLANT
NS IRRIG 1000ML POUR BTL (IV SOLUTION) ×2 IMPLANT
PACK BASIC LIMB (CUSTOM PROCEDURE TRAY) ×2 IMPLANT
PAD ARMBOARD 7.5X6 YLW CONV (MISCELLANEOUS) ×2 IMPLANT
SET BASIN LINEN APH (SET/KITS/TRAYS/PACK) ×2 IMPLANT
SUT ETHILON 3 0 FSL (SUTURE) ×2 IMPLANT
SYR CONTROL 10ML LL (SYRINGE) ×2 IMPLANT

## 2016-05-20 NOTE — Anesthesia Procedure Notes (Signed)
Anesthesia Regional Block:  Bier block (IV Regional)  Pre-Anesthetic Checklist: ,, timeout performed, Correct Patient, Correct Site, Correct Laterality, Correct Procedure,, site marked, surgical consent,, at surgeon's request Needles:  Injection technique: Single-shot  Needle Type: Other      Needle Gauge: 20 and 20 G    Additional Needles: Bier block (IV Regional)  Nerve Stimulator or Paresthesia:   Additional Responses:  Pulse checked post tourniquet inflation. IV NSL discontinued post injection. Narrative:   Performed by: Personally

## 2016-05-20 NOTE — Transfer of Care (Signed)
Immediate Anesthesia Transfer of Care Note  Patient: Summer Cook  Procedure(s) Performed: Procedure(s): CARPAL TUNNEL RELEASE (Left)  Patient Location: PACU  Anesthesia Type:Bier block  Level of Consciousness: awake and patient cooperative  Airway & Oxygen Therapy: Patient Spontanous Breathing and Patient connected to face mask oxygen  Post-op Assessment: Report given to RN, Post -op Vital signs reviewed and stable and Patient moving all extremities  Post vital signs: Reviewed and stable  Last Vitals:  Vitals:   05/20/16 0800 05/20/16 0815  BP: 109/77 114/80  Pulse:    Resp: 12 15  Temp:      Last Pain:  Vitals:   05/20/16 0724  TempSrc: Oral  PainSc: 7          Complications: No apparent anesthesia complications

## 2016-05-20 NOTE — Anesthesia Postprocedure Evaluation (Signed)
Anesthesia Post Note  Patient: Summer Cook  Procedure(s) Performed: Procedure(s) (LRB): CARPAL TUNNEL RELEASE (Left)  Patient location during evaluation: PACU Anesthesia Type: Regional Level of consciousness: awake and alert and oriented Pain management: pain level controlled Vital Signs Assessment: post-procedure vital signs reviewed and stable Respiratory status: spontaneous breathing, nonlabored ventilation and respiratory function stable Cardiovascular status: blood pressure returned to baseline and stable Postop Assessment: no signs of nausea or vomiting Anesthetic complications: no    Last Vitals:  Vitals:   05/20/16 0915 05/20/16 0930  BP: 90/79 108/90  Pulse: (!) 35 (!) 58  Resp: (!) 21 15  Temp:      Last Pain:  Vitals:   05/20/16 0724  TempSrc: Oral  PainSc: 7                  Maliya Marich A.

## 2016-05-20 NOTE — Interval H&P Note (Signed)
History and Physical Interval Note:  05/20/2016 8:05 AM  Summer Cook  has presented today for surgery, with the diagnosis of left carpal tunnel syndrome  The various methods of treatment have been discussed with the patient and family. After consideration of risks, benefits and other options for treatment, the patient has consented to  Procedure(s): CARPAL TUNNEL RELEASE (Left) as a surgical intervention .  The patient's history has been reviewed, patient examined, no change in status, stable for surgery.  I have reviewed the patient's chart and labs.  Questions were answered to the patient's satisfaction.     Arther Abbott

## 2016-05-20 NOTE — Discharge Instructions (Signed)
Carpal Tunnel Release, Care After °Refer to this sheet in the next few weeks. These instructions provide you with information about caring for yourself after your procedure. Your health care provider may also give you more specific instructions. Your treatment has been planned according to current medical practices, but problems sometimes occur. Call your health care provider if you have any problems or questions after your procedure. °WHAT TO EXPECT AFTER THE PROCEDURE °After your procedure, it is typical to have the following: °· Pain. °· Numbness. °· Tingling. °· Swelling. °· Stiffness. °· Bruising. °HOME CARE INSTRUCTIONS °· Take medicines only as directed by your health care provider. °· There are many different ways to close and cover an incision, including stitches (sutures), skin glue, and adhesive strips. Follow your health care provider's instructions about: °¨ Incision care. °¨ Bandage (dressing) changes and removal. °¨ Incision closure removal. °· Wear a splint or a brace as directed by your surgeon. You may need to do this for 2-3 weeks. °· Keep your hand raised (elevated) above the level of your heart while you are resting. Move your fingers often. °· Avoid activities that cause hand pain. °· Ask your surgeon when you can start to do all of your usual activities again, such as: °¨ Driving. °¨ Returning to work. °¨ Bathing and swimming. °· Keep all follow-up visits as directed by your health care provider. This is important. You may need physical therapy for several months to speed healing and regain movement. °SEEK MEDICAL CARE IF: °· You have drainage, redness, swelling, or pain at your incision site. °· You have a fever. °· You have chills. °· Your pain medicine is not working. °· Your symptoms do not go away after 2 months. °· Your symptoms go away and then return. °SEEK IMMEDIATE MEDICAL CARE IF: °· You have pain or numbness that is getting worse. °· Your fingers change color. °· You are not able  to move your fingers. °  °This information is not intended to replace advice given to you by your health care provider. Make sure you discuss any questions you have with your health care provider. °  °Document Released: 04/03/2005 Document Revised: 10/05/2014 Document Reviewed: 05/02/2014 °Elsevier Interactive Patient Education ©2016 Elsevier Inc. ° °

## 2016-05-20 NOTE — Op Note (Signed)
05/20/2016  9:05 AM  PATIENT:  Summer Cook  50 y.o. female  PRE-OPERATIVE DIAGNOSIS:  left carpal tunnel syndrome  POST-OPERATIVE DIAGNOSIS:  left carpal tunnel syndrome  PROCEDURE:  Procedure(s): CARPAL TUNNEL RELEASE (Left)  SURGEON:  Surgeon(s) and Role:    * Carole Civil, MD - Primary  PHYSICIAN ASSISTANT:   ASSISTANTS: none   ANESTHESIA:   regional  EBL:  Total I/O In: 300 [I.V.:300] Out: -   BLOOD ADMINISTERED:none  DRAINS: none   LOCAL MEDICATIONS USED:  MARCAINE    and Amount: 10 ml  SPECIMEN:  No Specimen  DISPOSITION OF SPECIMEN:  N/A  COUNTS:  YES  TOURNIQUET:  * Missing tourniquet times found for documented tourniquets in log:  [id number] *  DICTATION: .Dragon Dictation  PLAN OF CARE: Discharge to home after PACU  PATIENT DISPOSITION:  PACU - hemodynamically stable.   Delay start of Pharmacological VTE agent (>24hrs) due to surgical blood loss or risk of bleeding: not applicable  Carpal tunnel release left wrist  Preop diagnosis carpal tunnel syndrome left wrist   postop diagnosis same  Procedure open carpal tunnel release left wrist Surgeon Aline Brochure  Anesthesia regional Bier block  Operative findings compression of the left median nerve without any anterior carpal tunnel masses   Indications failure of conservative treatment to relieve pain and paresthesias and numbness and tingling of the left hand  The patient was identified in the preop area we confirm the surgical site marked as left wrist. Chart update completed. Patient taken to surgery. She had 2 g of Ancef. After establishing a Bier block left her arm was prepped with ChloraPrep.  Timeout executed completed and confirmed site.  A straight incision was made over the left carpal tunnel in line with the radial border of the ring finger. Blunt dissection was carried out to find the distal aspect of the carpal tunnel. A blunted instrument was passed beneath the carpal tunnel.  Sharp incision was then used to release the transverse carpal ligament. The contents of the carpal tunnel were inspected. The median nerve was compressed with slight discoloration.  The wound was irrigated and then closed with 3-0 nylon suture. We injected 10 mL of plain Marcaine on the radial side of the incision  A sterile bandage was applied and the tourniquet was released the color of the hand and capillary refill were normal  The patient was taken to the recovery room in stable condition

## 2016-05-20 NOTE — Anesthesia Postprocedure Evaluation (Signed)
Anesthesia Post Note  Patient: Summer Cook  Procedure(s) Performed: Procedure(s) (LRB): CARPAL TUNNEL RELEASE (Left)  Patient location during evaluation: PACU Anesthesia Type: Bier Block Level of consciousness: awake, oriented and patient cooperative Pain management: pain level controlled Vital Signs Assessment: post-procedure vital signs reviewed and stable Respiratory status: spontaneous breathing, nonlabored ventilation and respiratory function stable Cardiovascular status: blood pressure returned to baseline Postop Assessment: no signs of nausea or vomiting Anesthetic complications: no    Last Vitals:  Vitals:   05/20/16 0815 05/20/16 0910  BP: 114/80 94/75  Pulse:  (P) 66  Resp: 15   Temp:  (P) 36.7 C    Last Pain:  Vitals:   05/20/16 0724  TempSrc: Oral  PainSc: 7                  Berkeley Vanaken J

## 2016-05-20 NOTE — H&P (Signed)
Progress Notes Encounter Date: 05/11/2016 Carole Civil, MD  Orthopedic Surgery   Chief complaint Pain and paresthesias left hand for 8 months     HPI     50 year old female presents with 8 month history of pain and paresthesias of the left upper extremity. She did have a stroke with right sided symptoms which have resolved. She complains of pain swelling catching locking stiffness giving way numbness and tingling in the left hand primarily in the thumb index long and ring finger with sparing of the small finger   Previous treatment includes Aleve, tramadol and a brace for the last 6 months. She does have a history of thyroid disease no other risk factors. Review of Systems  Constitutional: Positive for malaise/fatigue and weight loss.  Respiratory: Positive for shortness of breath and wheezing.   Cardiovascular: Positive for leg swelling.  Gastrointestinal: Positive for abdominal pain and blood in stool.  Genitourinary: Positive for frequency.  Musculoskeletal: Positive for back pain and joint pain.  Skin: Negative.   Neurological: Positive for tingling and sensory change.  Psychiatric/Behavioral: Positive for depression. The patient is nervous/anxious.           Past Medical History:  Diagnosis Date  . Allergy    . Arthritis    . Asthma    . Candida esophagitis ([place])    . Chronic chest pain    . Depression    . Essential hypertension, benign    . GERD (gastroesophageal reflux disease)    . Gout    . Hemorrhoids    . 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  Last was Prednisone '40mg'$ ; 2 wk in 10/2012.  She also was on Prednisone bridged with Cellcept briefly but stopped due to lack of insurance.   . Normal cardiac stress test 02/2014  . Pulmonary embolism ([place]) 04/2012  . Renal insufficiency    . Serrated adenoma of colon    . Steroid-induced diabetes ([place])     . Thrush, oral 05/29/2011           Past Surgical History:  Procedure Laterality Date  . BONE MARROW BIOPSY      . CATARACT EXTRACTION      . CHOLECYSTECTOMY   2008  . COLONOSCOPY WITH ESOPHAGOGASTRODUODENOSCOPY (EGD) N/A 01/04/2013    Dr. Gala Romney: esophageal plaques with +KOH, hh. Gastric antrum abnormal, bx reactive gastropathy. Anal canal hemorrhoids, colonic tics, normal TI, single polyp (sessile serrated tubular adenoma). Next TCS 12/2017  . 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 TEE WITHOUT CARDIOVERSION N/A 01/03/2016    Procedure: TRANSESOPHAGEAL ECHOCARDIOGRAM (TEE) WITH PROPOFOL;  Surgeon: Satira Sark, MD;  [facility];  Service: Cardiovascular;  Laterality: N/A;          Family History  Problem Relation Age of Onset  . Cervical cancer Mother    . Lung cancer Father    . Colon polyps Father        ???  . Pneumonia Brother    . Colon cancer Paternal Grandmother 88  . AAA (abdominal aortic aneurysm) Paternal Grandmother    . Breast cancer Paternal Grandmother    . Colon cancer Paternal Grandfather 38  . Barrett's esophagus Paternal Aunt    . Stomach cancer Neg Hx    . Pancreatic cancer Neg Hx           Social History  Substance Use Topics  . Smoking status:  Former Smoker      Years: 0.00      Types: Cigarettes      Quit date: 02/14/2009  . Smokeless tobacco: Never Used  . Alcohol use No      Current Outpatient Prescriptions:  .  aspirin EC 81 MG tablet, Take 1 tablet (81 mg total) by mouth daily., Disp: 30 tablet, Rfl: 6 .  atorvastatin (LIPITOR) 40 MG tablet, Take 40 mg by mouth daily., Disp: , Rfl:  .  colestipol (COLESTID) 1 g tablet, Take 1 tablet (1 g total) by mouth 2 (two) times daily before a meal., Disp: 60 tablet, Rfl: 3 .  furosemide (LASIX) 20 MG tablet, Take 20-40 mg by mouth 2 (two) times daily as needed for fluid. Reported on 01/15/2016, Disp: , Rfl: 2 .  ipratropium-albuterol (DUONEB) 0.5-2.5 (3) MG/3ML SOLN, Take 3 mLs by nebulization 3  (three) times daily as needed (shorntess of breath/wheezing)., Disp: 360 mL, Rfl: 0 .  KLOR-CON 10 10 MEQ tablet, Take 10 mEq by mouth as needed (Only takes with the furosemide). Reported on 01/15/2016, Disp: , Rfl: 2 .  levothyroxine (SYNTHROID, LEVOTHROID) 50 MCG tablet, Take 1 tablet (50 mcg total) by mouth daily., Disp: 90 tablet, Rfl: 3 .  metoprolol succinate (TOPROL-XL) 50 MG 24 hr tablet, Take 1 tablet (50 mg total) by mouth daily., Disp: 30 tablet, Rfl: 6 .  Na Sulfate-K Sulfate-Mg Sulf 17.5-3.13-1.6 GM/180ML SOLN, Suprep-Use as directed (Patient not taking: Reported on 04/29/2016), Disp: 354 mL, Rfl: 0 .  ondansetron (ZOFRAN ODT) 4 MG disintegrating tablet, '4mg'$  ODT q4 hours prn nausea/vomit, Disp: 12 tablet, Rfl: 0 .  OVER THE COUNTER MEDICATION, Place 1 drop into both eyes daily as needed (Dry Eyes)., Disp: , Rfl:  .  PROAIR RESPICLICK 235 (90 BASE) MCG/ACT AEPB, Inhale 2 puffs into the lungs every 4 (four) hours as needed (Shortness of breath). , Disp: , Rfl: 3 .  sertraline (ZOLOFT) 50 MG tablet, Take 150 mg by mouth daily., Disp: , Rfl:  .  traMADol (ULTRAM) 50 MG tablet, Take 1 tablet (50 mg total) by mouth 3 (three) times daily as needed., Disp: 90 tablet, Rfl: 0   LMP 05/13/2011    Physical Exam  Constitutional: She is oriented to person, place, and time. She appears well-developed and well-nourished. No distress.  Cardiovascular: Normal rate and intact distal pulses.   Neurological: She is alert and oriented to person, place, and time. She has normal reflexes. She exhibits normal muscle tone. Coordination normal.  Skin: Skin is warm and dry. No rash noted. She is not diaphoretic. No erythema. No pallor.  Psychiatric: She has a normal mood and affect. Her behavior is normal. Judgment and thought content normal.      Ortho Exam   Left hand is showing definite symptoms of carpal tunnel with decreased sensation thumb index long ring finger sparing small finger. She has full range of  motion in the wrist and the wrist is stable. Grip strength is stronger left and right skin is intact with no nodules or rashes. Pulses are good temperature is normal lymph nodes in the epitrochlear region are negative sensory changes as described reflexes are 2+   On the right side she has mild loss of sensation in the median nerve distribution and good grip strength.   ASSESSMENT: There is a nerve study that we can purview but she basically has left carpal tunnel syndrome she's failed conservative management she wants to have surgery  PLAN Left carpal tunnel release   This procedure has been fully reviewed with the patient and written informed consent has been obtained.

## 2016-05-20 NOTE — H&P (Signed)
Progress Notes Encounter Date: 05/11/2016 Summer Civil, MD  Orthopedic Surgery   Hide copied text  Chief complaint Pain and paresthesias left hand for 8 months     HPI     50 year old female presents with 8 month history of pain and paresthesias of the left upper extremity. She did have a stroke with right sided symptoms which have resolved. She complains of pain swelling catching locking stiffness giving way numbness and tingling in the left hand primarily in the thumb index long and ring finger with sparing of the small finger   Previous treatment includes Aleve, tramadol and a brace for the last 6 months. She does have a history of thyroid disease no other risk factors. Review of Systems  Constitutional: Positive for malaise/fatigue and weight loss.  Respiratory: Positive for shortness of breath and wheezing.   Cardiovascular: Positive for leg swelling.  Gastrointestinal: Positive for abdominal pain and blood in stool.  Genitourinary: Positive for frequency.  Musculoskeletal: Positive for back pain and joint pain.  Skin: Negative.   Neurological: Positive for tingling and sensory change.  Psychiatric/Behavioral: Positive for depression. The patient is nervous/anxious.           Past Medical History:  Diagnosis Date  . Allergy    . Arthritis    . Asthma    . Candida esophagitis ([place])    . Chronic chest pain    . Depression    . Essential hypertension, benign    . GERD (gastroesophageal reflux disease)    . Gout    . Hemorrhoids    . 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  Last was Prednisone 71m; 2 wk in 10/2012.  She also was on Prednisone bridged with Cellcept briefly but stopped due to lack of insurance.   . Normal cardiac stress test 02/2014  . Pulmonary embolism ([place] 04/2012  . Renal insufficiency    . Serrated adenoma of colon    . Steroid-induced  diabetes ([place]    . Thrush, oral 05/29/2011           Past Surgical History:  Procedure Laterality Date  . BONE MARROW BIOPSY      . CATARACT EXTRACTION      . CHOLECYSTECTOMY   2008  . COLONOSCOPY WITH ESOPHAGOGASTRODUODENOSCOPY (EGD) N/A 01/04/2013    Dr. RGala Romney esophageal plaques with +KOH, hh. Gastric antrum abnormal, bx reactive gastropathy. Anal canal hemorrhoids, colonic tics, normal TI, single polyp (sessile serrated tubular adenoma). Next TCS 12/2017  . 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 TEE WITHOUT CARDIOVERSION N/A 01/03/2016    Procedure: TRANSESOPHAGEAL ECHOCARDIOGRAM (TEE) WITH PROPOFOL;  Surgeon: SSatira Sark MD;  [facility];  Service: Cardiovascular;  Laterality: N/A;          Family History  Problem Relation Age of Onset  . Cervical cancer Mother    . Lung cancer Father    . Colon polyps Father        ???  . Pneumonia Brother    . Colon cancer Paternal Grandmother 88  . AAA (abdominal aortic aneurysm) Paternal Grandmother    . Breast cancer Paternal Grandmother    . Colon cancer Paternal Grandfather 669 . Barrett's esophagus Paternal Aunt    . Stomach cancer Neg Hx    . Pancreatic cancer Neg Hx           Social History  Substance Use Topics  .  Smoking status: Former Smoker      Years: 0.00      Types: Cigarettes      Quit date: 02/14/2009  . Smokeless tobacco: Never Used  . Alcohol use No      Current Outpatient Prescriptions:  .  aspirin EC 81 MG tablet, Take 1 tablet (81 mg total) by mouth daily., Disp: 30 tablet, Rfl: 6 .  atorvastatin (LIPITOR) 40 MG tablet, Take 40 mg by mouth daily., Disp: , Rfl:  .  colestipol (COLESTID) 1 g tablet, Take 1 tablet (1 g total) by mouth 2 (two) times daily before a meal., Disp: 60 tablet, Rfl: 3 .  furosemide (LASIX) 20 MG tablet, Take 20-40 mg by mouth 2 (two) times daily as needed for fluid. Reported on 01/15/2016, Disp: , Rfl: 2 .  ipratropium-albuterol (DUONEB) 0.5-2.5 (3) MG/3ML SOLN, Take 3 mLs by  nebulization 3 (three) times daily as needed (shorntess of breath/wheezing)., Disp: 360 mL, Rfl: 0 .  KLOR-CON 10 10 MEQ tablet, Take 10 mEq by mouth as needed (Only takes with the furosemide). Reported on 01/15/2016, Disp: , Rfl: 2 .  levothyroxine (SYNTHROID, LEVOTHROID) 50 MCG tablet, Take 1 tablet (50 mcg total) by mouth daily., Disp: 90 tablet, Rfl: 3 .  metoprolol succinate (TOPROL-XL) 50 MG 24 hr tablet, Take 1 tablet (50 mg total) by mouth daily., Disp: 30 tablet, Rfl: 6 .  Na Sulfate-K Sulfate-Mg Sulf 17.5-3.13-1.6 GM/180ML SOLN, Suprep-Use as directed (Patient not taking: Reported on 04/29/2016), Disp: 354 mL, Rfl: 0 .  ondansetron (ZOFRAN ODT) 4 MG disintegrating tablet, 66m ODT q4 hours prn nausea/vomit, Disp: 12 tablet, Rfl: 0 .  OVER THE COUNTER MEDICATION, Place 1 drop into both eyes daily as needed (Dry Eyes)., Disp: , Rfl:  .  PROAIR RESPICLICK 1482(90 BASE) MCG/ACT AEPB, Inhale 2 puffs into the lungs every 4 (four) hours as needed (Shortness of breath). , Disp: , Rfl: 3 .  sertraline (ZOLOFT) 50 MG tablet, Take 150 mg by mouth daily., Disp: , Rfl:  .  traMADol (ULTRAM) 50 MG tablet, Take 1 tablet (50 mg total) by mouth 3 (three) times daily as needed., Disp: 90 tablet, Rfl: 0   LMP 05/13/2011    Physical Exam  Constitutional: She is oriented to person, place, and time. She appears well-developed and well-nourished. No distress.  Cardiovascular: Normal rate and intact distal pulses.   Neurological: She is alert and oriented to person, place, and time. She has normal reflexes. She exhibits normal muscle tone. Coordination normal.  Skin: Skin is warm and dry. No rash noted. She is not diaphoretic. No erythema. No pallor.  Psychiatric: She has a normal mood and affect. Her behavior is normal. Judgment and thought content normal.      Ortho Exam   Left hand is showing definite symptoms of carpal tunnel with decreased sensation thumb index long ring finger sparing small finger. She  has full range of motion in the wrist and the wrist is stable. Grip strength is stronger left and right skin is intact with no nodules or rashes. Pulses are good temperature is normal lymph nodes in the epitrochlear region are negative sensory changes as described reflexes are 2+   On the right side she has mild loss of sensation in the median nerve distribution and good grip strength.   ASSESSMENT: There is a nerve study that we can purview but she basically has left carpal tunnel syndrome she's failed conservative management she wants to have surgery  PLAN Left carpal tunnel release   This procedure has been fully reviewed with the patient and written informed consent has been obtained.

## 2016-05-20 NOTE — Brief Op Note (Signed)
05/20/2016  9:05 AM  PATIENT:  Marcelino Freestone  50 y.o. female  PRE-OPERATIVE DIAGNOSIS:  left carpal tunnel syndrome  POST-OPERATIVE DIAGNOSIS:  left carpal tunnel syndrome  PROCEDURE:  Procedure(s): CARPAL TUNNEL RELEASE (Left)  SURGEON:  Surgeon(s) and Role:    * Carole Civil, MD - Primary  PHYSICIAN ASSISTANT:   ASSISTANTS: none   ANESTHESIA:   regional  EBL:  Total I/O In: 300 [I.V.:300] Out: -   BLOOD ADMINISTERED:none  DRAINS: none   LOCAL MEDICATIONS USED:  MARCAINE    and Amount: 10 ml  SPECIMEN:  No Specimen  DISPOSITION OF SPECIMEN:  N/A  COUNTS:  YES  TOURNIQUET:  * Missing tourniquet times found for documented tourniquets in log:  [id number] *  DICTATION: .Dragon Dictation  PLAN OF CARE: Discharge to home after PACU  PATIENT DISPOSITION:  PACU - hemodynamically stable.   Delay start of Pharmacological VTE agent (>24hrs) due to surgical blood loss or risk of bleeding: not applicable

## 2016-05-20 NOTE — Addendum Note (Signed)
Addendum  created 05/20/16 0933 by Josephine Igo, MD   Sign clinical note

## 2016-05-22 ENCOUNTER — Encounter (HOSPITAL_COMMUNITY): Payer: Medicare HMO | Attending: Oncology | Admitting: Oncology

## 2016-05-22 ENCOUNTER — Encounter (HOSPITAL_COMMUNITY): Payer: Medicare HMO

## 2016-05-22 ENCOUNTER — Encounter (HOSPITAL_COMMUNITY): Payer: Self-pay | Admitting: Oncology

## 2016-05-22 VITALS — BP 111/76 | HR 71 | Temp 98.3°F | Resp 20 | Ht 65.0 in | Wt 193.0 lb

## 2016-05-22 DIAGNOSIS — D693 Immune thrombocytopenic purpura: Secondary | ICD-10-CM | POA: Diagnosis not present

## 2016-05-22 LAB — CBC
HCT: 47.7 % — ABNORMAL HIGH (ref 36.0–46.0)
Hemoglobin: 15.6 g/dL — ABNORMAL HIGH (ref 12.0–15.0)
MCH: 27.6 pg (ref 26.0–34.0)
MCHC: 32.7 g/dL (ref 30.0–36.0)
MCV: 84.4 fL (ref 78.0–100.0)
PLATELETS: 144 10*3/uL — AB (ref 150–400)
RBC: 5.65 MIL/uL — AB (ref 3.87–5.11)
RDW: 17.6 % — AB (ref 11.5–15.5)
WBC: 6.9 10*3/uL (ref 4.0–10.5)

## 2016-05-22 NOTE — Assessment & Plan Note (Addendum)
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    Labs today: CBC diff. I personally reviewed and went over laboratory results with the patient.  The results are noted within this dictation.  Labs in 6 weeks: CBC  Labs in 12 weeks: CBC diff, BMET.  Patient educated on the role of corticosteroids and ITP.  She is educated on her platelet count and the goal of maintaining a platelet count of 60,000 or greater is most reasonable.  She will not be prescribed dexamethasone moving forward unless indicated.  She is also educated on newer agents for her ITP if needed that would be more appropriate in Nivea's situation (ie N-PLATE, PROMACTA).    She is currently recovering from left carpal tunnel surgery by Dr. Aline Brochure.  I personally reviewed and went over radiographic studies with the patient.  The results are noted within this dictation.  Mammogram performed on 02/28/2016 is BIRADS 1.   Return in 12 weeks for follow-up.

## 2016-05-22 NOTE — Patient Instructions (Signed)
[place] at [facility] Discharge Instructions  RECOMMENDATIONS MADE BY THE CONSULTANT AND ANY TEST RESULTS WILL BE SENT TO YOUR REFERRING PHYSICIAN.  You saw Kirby Crigler PA-C today. You should have labs drawn every 6 weeks and return in 12 week for follow up with Dr. Whitney Muse.   Thank you for choosing [place] at [facility] to provide your oncology and hematology care.  To afford each patient quality time with our provider, please arrive at least 15 minutes before your scheduled appointment time.   Beginning January 23rd 2017 lab work for the [facility] will be done in the  Main lab at [facility] on 1st floor. If you have a lab appointment with the [facility] please come in thru the  Main Entrance and check in at the main information desk  You need to re-schedule your appointment should you arrive 10 or more minutes late.  We strive to give you quality time with our providers, and arriving late affects you and other patients whose appointments are after yours.  Also, if you no show three or more times for appointments you may be dismissed from the clinic at the providers discretion.     Again, thank you for choosing [facility].  Our hope is that these requests will decrease the amount of time that you wait before being seen by our physicians.       _____________________________________________________________  Should you have questions after your visit to [facility], please contact our office at [phone] between the hours of 8:30 a.m. and 4:30 p.m.  Voicemails left after 4:30 p.m. will not be returned until the following business day.  For prescription refill requests, have your pharmacy contact our office.         Resources For Cancer Patients and their Caregivers ? American Cancer Society: Can assist with transportation, wigs, general needs, runs Look Good Feel Better.         ([phone] [phone] ? Cancer Care: Provides financial assistance, online support groups, medication/co-pay assistance.  1-800-813-HOPE 581-627-7707) ? [place] Assists [place] cancer patients and their families through emotional , educational and financial support.  732-358-8425 ? [place] DSS Where to apply for food stamps, Medicaid and utility assistance. (818)168-1345 ? [facility]: Transportation to medical appointments. 7543745772 ? Social Security Administration: May apply for disability if have a Stage IV cancer. 213-295-4066 9713758375 ? [employer], Disability and Transit Services: Assists with nutrition, care and transit needs. [place] Support Programs: @10RELATIVEDAYS @ > Cancer Support Group  2nd Tuesday of the month 1pm-2pm, Journey Room  > Creative Journey  3rd Tuesday of the month 1130am-1pm, Journey Room  > Look Good Feel Better  1st Wednesday of the month 10am-12 noon, Journey Room (Call [place] to register [phone])

## 2016-05-24 NOTE — Progress Notes (Signed)
Summer Koch, MD [place] [id number]  ITP (idiopathic thrombocytopenic purpura) - Plan: CBC, CBC with Differential, Basic metabolic panel  CURRENT THERAPY: Oberservation INTERVAL HISTORY: Summer Cook 50 y.o. 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  She denies any blood in her stool or dark stool.  She denies any vaginal bleeding or hematuria.  She denies any hemoptysis or hematemeses.  She denies any rashes.  She denies easy bruising.  She is S/P left carpal tunnel intervention by Dr. Aline Brochure.  Review of Systems  Constitutional: Negative.  Negative for chills, fever and weight loss.  HENT: Negative.  Negative for nosebleeds.   Eyes: Negative.  Negative for blurred vision and double vision.  Respiratory: Negative.  Negative for cough and hemoptysis.   Cardiovascular: Negative.  Negative for chest pain.  Gastrointestinal: Negative.  Negative for blood in stool and melena.  Genitourinary: Negative.  Negative for hematuria.  Musculoskeletal: Negative.   Skin: Negative.  Negative for rash.  Neurological: Negative.  Negative for weakness and headaches.  Endo/Heme/Allergies: Negative.  Does not bruise/bleed easily.  Psychiatric/Behavioral: Negative.       Past Medical History:  Diagnosis Date  . Allergy   . Arthritis   . Asthma   . Candida esophagitis ([place])   . Chronic chest pain   . Depression   . Essential hypertension, benign   . GERD (gastroesophageal reflux disease)   . Gout   . Hemorrhoids   . 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  Last was Prednisone '40mg'$ ; 2 wk in 10/2012.  She also was on Prednisone bridged with Cellcept briefly but stopped due to lack of insurance.   . Normal cardiac stress test 02/2014  . Pulmonary embolism ([place])  04/2012  . Renal insufficiency   . Serrated adenoma of colon   . Steroid-induced diabetes ([place])   . Stroke ([place])   . Thrush, oral 05/29/2011    has Hypertension; ITP (idiopathic thrombocytopenic purpura); Tachycardia; Hemorrhoids; Steroid-induced diabetes ([place]); Post-splenectomy; Immunosuppressed status ([place]); Depression; Obesity; PE (pulmonary embolism); Rectal bleeding; Unspecified constipation; Nausea with vomiting; Abnormal LFTs; Orthostatic hypotension; CKD (chronic kidney disease) stage 3, GFR 30-59 ml/min; Asthma, chronic; Anxiety state; Back pain; Cerebrovascular accident (CVA) due to embolism ([place]); Chronic back pain; History of stroke; C7 radiculopathy; and Carpal tunnel syndrome, left on her problem list.     is allergic to brintellix [vortioxetine]; yellow jacket venom; and adhesive [tape].  Current Outpatient Prescriptions on File Prior to Visit  Medication Sig Dispense Refill  . aspirin EC 81 MG tablet Take 1 tablet (81 mg total) by mouth daily. 30 tablet 6  . atorvastatin (LIPITOR) 40 MG tablet Take 40 mg by mouth daily.    . colestipol (COLESTID) 1 g tablet Take 1 tablet (1 g total) by mouth 2 (two) times daily before a meal. 60 tablet 3  . furosemide (LASIX) 20 MG tablet Take 20-40 mg by mouth 2 (two) times daily as needed for fluid. Reported on 01/15/2016  2  . ipratropium-albuterol (DUONEB) 0.5-2.5 (3) MG/3ML SOLN Take 3 mLs by nebulization 3 (three) times daily as needed (shorntess of breath/wheezing). 360 mL 0  . KLOR-CON 10 10 MEQ tablet Take 10 mEq by mouth as needed (Only takes with the furosemide). Reported on 01/15/2016  2  . levothyroxine (SYNTHROID, LEVOTHROID) 50 MCG  tablet Take 1 tablet (50 mcg total) by mouth daily. 90 tablet 3  . metoprolol succinate (TOPROL-XL) 50 MG 24 hr tablet Take 1 tablet (50 mg total) by mouth daily. 30 tablet 6  . ondansetron (ZOFRAN ODT) 4 MG disintegrating tablet '4mg'$  ODT q4 hours prn nausea/vomit 12 tablet 0  . OVER THE COUNTER MEDICATION  Place 1 drop into both eyes daily as needed (Dry Eyes).    Marland Kitchen oxyCODONE-acetaminophen (ROXICET) 5-325 MG tablet Take 1 tablet by mouth every 4 (four) hours as needed for severe pain. 30 tablet 0  . PROAIR RESPICLICK 102 (90 BASE) MCG/ACT AEPB Inhale 2 puffs into the lungs every 4 (four) hours as needed (Shortness of breath).   3  . sertraline (ZOLOFT) 50 MG tablet Take 150 mg by mouth daily.    . [DISCONTINUED] prochlorperazine (COMPAZINE) 10 MG tablet Take 10 mg by mouth as needed. For nausea      No current facility-administered medications on file prior to visit.     Past Surgical History:  Procedure Laterality Date  . BONE MARROW BIOPSY    . CARPAL TUNNEL RELEASE    . CATARACT EXTRACTION    . CHOLECYSTECTOMY  2008  . COLONOSCOPY WITH ESOPHAGOGASTRODUODENOSCOPY (EGD) N/A 01/04/2013   Dr. Gala Romney: esophageal plaques with +KOH, hh. Gastric antrum abnormal, bx reactive gastropathy. Anal canal hemorrhoids, colonic tics, normal TI, single polyp (sessile serrated tubular adenoma). Next TCS 12/2017  . 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 TEE WITHOUT CARDIOVERSION N/A 01/03/2016   Procedure: TRANSESOPHAGEAL ECHOCARDIOGRAM (TEE) WITH PROPOFOL;  Surgeon: Satira Sark, MD;  Location: [facility];  Service: Cardiovascular;  Laterality: N/A;    PHYSICAL EXAMINATION  ECOG PERFORMANCE STATUS: 0 - Asymptomatic  Vitals:   05/22/16 1404  BP: 111/76  Pulse: 71  Resp: 20  Temp: 98.3 F (36.8 C)    GENERAL:alert, no distress, comfortable, cooperative, obese, smiling and unaccompanied. SKIN: skin color, texture, turgor are normal, no rashes or significant lesions HEAD: Normocephalic, No masses, lesions, tenderness or abnormalities EYES: normal, EOMI, Conjunctiva are pink and non-injected EARS: External ears normal OROPHARYNX:lips, buccal mucosa, and tongue normal and mucous membranes are moist  NECK: supple, trachea midline, thick LYMPH:  no palpable lymphadenopathy BREAST:not examined LUNGS: clear  to auscultation  HEART: regular rate & rhythm, no murmurs, no gallops, S1 normal and S2 normal ABDOMEN:abdomen soft, non-tender, obese and normal bowel sounds BACK: Back symmetric, no curvature., No CVA tenderness EXTREMITIES:less then 2 second capillary refill, no joint deformities, effusion, or inflammation, no skin discoloration, no cyanosis.  Left arm in sling and cleanly dressed wrist area. NEURO: alert & oriented x 3 with fluent speech, no focal motor/sensory deficits, gait normal   LABORATORY DATA: CBC    Component Value Date/Time   WBC 6.9 05/22/2016 1325   RBC 5.65 (H) 05/22/2016 1325   HGB 15.6 (H) 05/22/2016 1325   HGB 11.1 (L) 02/02/2013 0840   HCT 47.7 (H) 05/22/2016 1325   HCT 34.8 02/02/2013 0840   PLT 144 (L) 05/22/2016 1325   PLT 336 02/02/2013 0840   MCV 84.4 05/22/2016 1325   MCV 74.3 (L) 02/02/2013 0840   MCH 27.6 05/22/2016 1325   MCHC 32.7 05/22/2016 1325   RDW 17.6 (H) 05/22/2016 1325   RDW 21.1 (H) 02/02/2013 0840   LYMPHSABS 2.9 04/29/2016 1556   LYMPHSABS 0.8 (L) 02/02/2013 0840   MONOABS 0.8 04/29/2016 1556   MONOABS 0.3 02/02/2013 0840   EOSABS 0.2 04/29/2016 1556   EOSABS 0.0  02/02/2013 0840   BASOSABS 0.1 04/29/2016 1556   BASOSABS 0.0 02/02/2013 0840      Chemistry      Component Value Date/Time   NA 134 (L) 05/18/2016 1300   NA 140 02/02/2013 0840   K 3.8 05/18/2016 1300   K 3.9 02/02/2013 0840   CL 105 05/18/2016 1300   CL 97 (L) 02/02/2013 0840   CO2 19 (L) 05/18/2016 1300   CO2 30 (H) 02/02/2013 0840   BUN 24 (H) 05/18/2016 1300   BUN 25.7 02/02/2013 0840   CREATININE 1.82 (H) 05/18/2016 1300   CREATININE 1.27 (H) 01/24/2015 1106   CREATININE 1.4 (H) 02/02/2013 0840      Component Value Date/Time   CALCIUM 9.3 05/18/2016 1300   CALCIUM 8.7 02/02/2013 0840   ALKPHOS 102 04/29/2016 1556   ALKPHOS 60 02/02/2013 0840   AST 36 04/29/2016 1556   AST 23 02/02/2013 0840   ALT 22 04/29/2016 1556   ALT 67 (H) 02/02/2013 0840    BILITOT 0.9 04/29/2016 1556   BILITOT 0.68 02/02/2013 0840        PENDING LABS:   RADIOGRAPHIC STUDIES:  No results found.   PATHOLOGY:    ASSESSMENT AND PLAN:  ITP (idiopathic thrombocytopenic purpura) ITP diagnosed in 2011; having failed splenectomy but responsive to corticosteroid treatment.    Labs today: CBC diff. I personally reviewed and went over laboratory results with the patient.  The results are noted within this dictation.  Labs in 6 weeks: CBC  Labs in 12 weeks: CBC diff, BMET.  Patient educated on the role of corticosteroids and ITP.  She is educated on her platelet count and the goal of maintaining a platelet count of 60,000 or greater is most reasonable.  She will not be prescribed dexamethasone moving forward unless indicated.  She is also educated on newer agents for her ITP if needed that would be more appropriate in [place]'s situation (ie N-PLATE, PROMACTA).    She is currently recovering from left carpal tunnel surgery by Dr. Aline Brochure.  I personally reviewed and went over radiographic studies with the patient.  The results are noted within this dictation.  Mammogram performed on 02/28/2016 is BIRADS 1.   Return in 12 weeks for follow-up.  THERAPY PLAN:  Oberervation of platelet count.  All questions were answered. The patient knows to call the clinic with any problems, questions or concerns. We can certainly see the patient much sooner if necessary.  Patient and plan discussed with Dr. Ancil Linsey and she is in agreement with the aforementioned.   This note is electronically signed by: Doy Mince 05/24/2016 9:18 PM

## 2016-05-25 ENCOUNTER — Encounter (HOSPITAL_COMMUNITY): Payer: Self-pay | Admitting: Orthopedic Surgery

## 2016-05-26 ENCOUNTER — Encounter: Payer: Self-pay | Admitting: Orthopedic Surgery

## 2016-05-26 ENCOUNTER — Ambulatory Visit (INDEPENDENT_AMBULATORY_CARE_PROVIDER_SITE_OTHER): Payer: Medicare HMO | Admitting: Orthopedic Surgery

## 2016-05-26 VITALS — BP 113/68 | HR 117 | Ht 65.0 in | Wt 193.0 lb

## 2016-05-26 DIAGNOSIS — M545 Low back pain: Secondary | ICD-10-CM | POA: Diagnosis not present

## 2016-05-26 DIAGNOSIS — Z4789 Encounter for other orthopedic aftercare: Secondary | ICD-10-CM

## 2016-05-26 DIAGNOSIS — M1712 Unilateral primary osteoarthritis, left knee: Secondary | ICD-10-CM | POA: Diagnosis not present

## 2016-05-26 DIAGNOSIS — G5602 Carpal tunnel syndrome, left upper limb: Secondary | ICD-10-CM

## 2016-05-26 MED ORDER — HYDROCODONE-ACETAMINOPHEN 7.5-325 MG PO TABS: 1.0000 | ORAL_TABLET | Freq: Four times a day (QID) | ORAL | 0 refills | Status: DC | PRN

## 2016-05-26 NOTE — Progress Notes (Signed)
Summer Cook is status post left carpal tunnel release   S:   Chief Complaint  Patient presents with  . Follow-up    POST OP 1 LEFT CARPAL TUNNEL RELEASE, DOS 05/20/16   Doing well.   The patient complains of  itching  Current pain medication: percocet  The incision is clean dry and intact with no drainage The dressing was changed.  Patient to return for suture removal ~ POD 12-14

## 2016-06-01 ENCOUNTER — Other Ambulatory Visit: Payer: Self-pay | Admitting: Orthopedic Surgery

## 2016-06-01 DIAGNOSIS — Z4789 Encounter for other orthopedic aftercare: Secondary | ICD-10-CM

## 2016-06-01 DIAGNOSIS — G5602 Carpal tunnel syndrome, left upper limb: Secondary | ICD-10-CM

## 2016-06-02 ENCOUNTER — Other Ambulatory Visit: Payer: Self-pay | Admitting: *Deleted

## 2016-06-02 MED ORDER — HYDROCODONE-ACETAMINOPHEN 5-325 MG PO TABS: 1.0000 | ORAL_TABLET | Freq: Four times a day (QID) | ORAL | 0 refills | Status: DC | PRN

## 2016-06-03 ENCOUNTER — Ambulatory Visit (INDEPENDENT_AMBULATORY_CARE_PROVIDER_SITE_OTHER): Payer: Medicare HMO | Admitting: Orthopedic Surgery

## 2016-06-03 ENCOUNTER — Encounter: Payer: Self-pay | Admitting: Orthopedic Surgery

## 2016-06-03 VITALS — BP 127/92 | HR 77 | Ht 65.0 in | Wt 188.0 lb

## 2016-06-03 DIAGNOSIS — T814XXA Infection following a procedure, initial encounter: Secondary | ICD-10-CM

## 2016-06-03 DIAGNOSIS — Z4789 Encounter for other orthopedic aftercare: Secondary | ICD-10-CM

## 2016-06-03 DIAGNOSIS — IMO0001 Reserved for inherently not codable concepts without codable children: Secondary | ICD-10-CM

## 2016-06-03 MED ORDER — CEPHALEXIN 500 MG PO CAPS: 500.0000 mg | ORAL_CAPSULE | Freq: Two times a day (BID) | ORAL | 0 refills | Status: DC

## 2016-06-03 NOTE — Progress Notes (Signed)
Patient ID: BRENNEN BRINGER, female   DOB: 31-Mar-1966, 50 y.o.   MRN: [id number]  Post op visit   Chief Complaint  Patient presents with  . Follow-up    POST OP 2, LEFT CARPAL TUNNEL RELEASE, DOS 05/20/16    BP (!) 127/92   Pulse 77   Ht 5\' 5"  (1.651 m)   Wt 188 lb (85.3 kg)   LMP 05/13/2011   BMI 31.28 kg/m   Paresthesias are improved but the wound is painful for her swelling with some surrounding erythema and increased moisture  Removed all sutures in wound is intact  Recommend dry sterile dressing with daily dressing change  ASSESSMENT AND PLAN   Encounter Diagnosis  Name Primary?  . Cellulitis, wound, post-operative, initial encounter Yes   Meds ordered this encounter  Medications  . DISCONTD: cephALEXin (KEFLEX) 500 MG capsule    Sig: Take 1 capsule (500 mg total) by mouth 2 (two) times daily.    Dispense:  28 capsule    Refill:  0  . cephALEXin (KEFLEX) 500 MG capsule    Sig: Take 1 capsule (500 mg total) by mouth 2 (two) times daily.    Dispense:  28 capsule    Refill:  0   1 weeks fu, Check wound

## 2016-06-03 NOTE — Patient Instructions (Signed)
Dry dressing change daily

## 2016-06-05 ENCOUNTER — Telehealth: Payer: Self-pay | Admitting: Orthopedic Surgery

## 2016-06-05 NOTE — Telephone Encounter (Signed)
Pharmacist at [facility] left voice message 06/04/16 asking if okay for patient to receive the prescribed Norco early?  States prescription picked up 05/26/16 should last until 06/06/16.  Please call [phone]

## 2016-06-08 DIAGNOSIS — Z23 Encounter for immunization: Secondary | ICD-10-CM | POA: Diagnosis not present

## 2016-06-10 ENCOUNTER — Ambulatory Visit (INDEPENDENT_AMBULATORY_CARE_PROVIDER_SITE_OTHER): Payer: Medicare HMO | Admitting: Orthopedic Surgery

## 2016-06-10 ENCOUNTER — Other Ambulatory Visit: Payer: Self-pay | Admitting: Orthopedic Surgery

## 2016-06-10 DIAGNOSIS — S39012A Strain of muscle, fascia and tendon of lower back, initial encounter: Secondary | ICD-10-CM

## 2016-06-10 DIAGNOSIS — IMO0001 Reserved for inherently not codable concepts without codable children: Secondary | ICD-10-CM

## 2016-06-10 DIAGNOSIS — Z4789 Encounter for other orthopedic aftercare: Secondary | ICD-10-CM

## 2016-06-10 DIAGNOSIS — G5602 Carpal tunnel syndrome, left upper limb: Secondary | ICD-10-CM

## 2016-06-10 DIAGNOSIS — T814XXA Infection following a procedure, initial encounter: Secondary | ICD-10-CM

## 2016-06-10 DIAGNOSIS — G5601 Carpal tunnel syndrome, right upper limb: Secondary | ICD-10-CM

## 2016-06-10 MED ORDER — PREDNISONE 10 MG PO TABS: 10.0000 mg | ORAL_TABLET | Freq: Two times a day (BID) | ORAL | 0 refills | Status: DC

## 2016-06-10 MED ORDER — TIZANIDINE HCL 4 MG PO TABS: 4.0000 mg | ORAL_TABLET | Freq: Four times a day (QID) | ORAL | 0 refills | Status: DC | PRN

## 2016-06-10 NOTE — Patient Instructions (Addendum)
Right carpal tunnel release 07/16/16  Start following medications for new-onset lower back pain Meds ordered this encounter  Medications  . predniSONE (DELTASONE) 10 MG tablet    Sig: Take 1 tablet (10 mg total) by mouth 2 (two) times daily with a meal.    Dispense:  20 tablet    Refill:  0  . tiZANidine (ZANAFLEX) 4 MG tablet    Sig: Take 1 tablet (4 mg total) by mouth every 6 (six) hours as needed for muscle spasms.    Dispense:  30 tablet    Refill:  0

## 2016-06-10 NOTE — Addendum Note (Signed)
Addended by: Baldomero Lamy B on: 06/10/2016 11:44 AM   Modules accepted: Orders, SmartSet

## 2016-06-10 NOTE — Progress Notes (Signed)
No chief complaint on file.  LMP 05/13/2011   Status post left carpal tunnel release. She had a postoperative wound cellulitis treated with by mouth antibiotics and has improved. Most of her symptoms now on the left-hand her around the incision with  no residual numbness.  She is interested in a right carpal tunnel release  New problem new complaint Thursday 6 days ago she reached down to pick something up pulled a muscle in her right lower back. Complains of moderate right lower back pain and tightness  Review of systems There is no radicular symptoms related to that.   Physical Exam  Constitutional: She is oriented to person, place, and time. She appears well-developed and well-nourished. No distress.  Cardiovascular: Normal rate and intact distal pulses.   Neurological: She is alert and oriented to person, place, and time. She has normal reflexes. She exhibits normal muscle tone. Coordination normal.  Skin: Skin is warm and dry. No rash noted. She is not diaphoretic. No erythema. No pallor.  Psychiatric: She has a normal mood and affect. Her behavior is normal. Judgment and thought content normal.   Tenderness right lower back midline spine looks/feels normal left side feels normal  No radicular symptoms. Ambulation remains normal    Encounter Diagnoses  Name Primary?  . Cellulitis, wound, post-operative, initial encounter Yes  . Surgical aftercare, musculoskeletal system   . Carpal tunnel syndrome of left wrist   . Low back strain, initial encounter   . Carpal tunnel syndrome of right wrist      Schedule carpal tunnel release   Start 10 mg prednisone and tizanidine for lumbar spine

## 2016-06-11 ENCOUNTER — Telehealth (HOSPITAL_COMMUNITY): Payer: Self-pay

## 2016-06-11 NOTE — Telephone Encounter (Signed)
-----   Message from Louis Meckel sent at 06/10/2016  2:33 PM EDT ----- Regarding: please call Patient saw Dr Aline Brochure and he put her on predisoine and she has questions to ask [place] here with her dx here.  Thanks

## 2016-06-16 ENCOUNTER — Other Ambulatory Visit: Payer: Self-pay | Admitting: Internal Medicine

## 2016-06-17 ENCOUNTER — Emergency Department (HOSPITAL_COMMUNITY): Payer: Medicare HMO

## 2016-06-17 ENCOUNTER — Emergency Department (HOSPITAL_COMMUNITY)
Admission: EM | Admit: 2016-06-17 | Discharge: 2016-06-17 | Disposition: A | Discharge status: Home / Self Care | Payer: Medicare HMO | Attending: Emergency Medicine | Admitting: Emergency Medicine

## 2016-06-17 ENCOUNTER — Encounter (HOSPITAL_COMMUNITY): Payer: Self-pay | Admitting: *Deleted

## 2016-06-17 ENCOUNTER — Other Ambulatory Visit: Payer: Self-pay

## 2016-06-17 DIAGNOSIS — Z87891 Personal history of nicotine dependence: Secondary | ICD-10-CM | POA: Insufficient documentation

## 2016-06-17 DIAGNOSIS — R55 Syncope and collapse: Secondary | ICD-10-CM | POA: Insufficient documentation

## 2016-06-17 DIAGNOSIS — R42 Dizziness and giddiness: Secondary | ICD-10-CM | POA: Diagnosis not present

## 2016-06-17 DIAGNOSIS — I129 Hypertensive chronic kidney disease with stage 1 through stage 4 chronic kidney disease, or unspecified chronic kidney disease: Secondary | ICD-10-CM | POA: Insufficient documentation

## 2016-06-17 DIAGNOSIS — N183 Chronic kidney disease, stage 3 (moderate): Secondary | ICD-10-CM | POA: Insufficient documentation

## 2016-06-17 DIAGNOSIS — J45909 Unspecified asthma, uncomplicated: Secondary | ICD-10-CM | POA: Diagnosis not present

## 2016-06-17 DIAGNOSIS — Z79899 Other long term (current) drug therapy: Secondary | ICD-10-CM | POA: Insufficient documentation

## 2016-06-17 LAB — URINE MICROSCOPIC-ADD ON

## 2016-06-17 LAB — COMPREHENSIVE METABOLIC PANEL
ALBUMIN: 3.7 g/dL (ref 3.5–5.0)
ALT: 20 U/L (ref 14–54)
AST: 31 U/L (ref 15–41)
Alkaline Phosphatase: 138 U/L — ABNORMAL HIGH (ref 38–126)
Anion gap: 7 (ref 5–15)
BUN: 40 mg/dL — AB (ref 6–20)
CHLORIDE: 106 mmol/L (ref 101–111)
CO2: 24 mmol/L (ref 22–32)
CREATININE: 2 mg/dL — AB (ref 0.44–1.00)
Calcium: 8.8 mg/dL — ABNORMAL LOW (ref 8.9–10.3)
GFR calc Af Amer: 32 mL/min — ABNORMAL LOW (ref >60)
GFR calc non Af Amer: 28 mL/min — ABNORMAL LOW (ref >60)
GLUCOSE: 89 mg/dL (ref 65–99)
POTASSIUM: 4.3 mmol/L (ref 3.5–5.1)
Sodium: 137 mmol/L (ref 135–145)
Total Bilirubin: 1 mg/dL (ref 0.3–1.2)
Total Protein: 7 g/dL (ref 6.5–8.1)

## 2016-06-17 LAB — I-STAT TROPONIN, ED
TROPONIN I, POC: 0 ng/mL (ref 0.00–0.08)
Troponin i, poc: 0 ng/mL (ref 0.00–0.08)

## 2016-06-17 LAB — URINALYSIS, ROUTINE W REFLEX MICROSCOPIC
Bilirubin Urine: NEGATIVE
Glucose, UA: NEGATIVE mg/dL
Hgb urine dipstick: NEGATIVE
Ketones, ur: NEGATIVE mg/dL
NITRITE: NEGATIVE
PROTEIN: NEGATIVE mg/dL
SPECIFIC GRAVITY, URINE: 1.02 (ref 1.005–1.030)
pH: 5 (ref 5.0–8.0)

## 2016-06-17 LAB — CBC WITH DIFFERENTIAL/PLATELET
BASOS ABS: 0 10*3/uL (ref 0.0–0.1)
BASOS PCT: 1 %
EOS PCT: 3 %
Eosinophils Absolute: 0.2 10*3/uL (ref 0.0–0.7)
HEMATOCRIT: 44.4 % (ref 36.0–46.0)
Hemoglobin: 14.4 g/dL (ref 12.0–15.0)
LYMPHS PCT: 50 %
Lymphs Abs: 3.7 10*3/uL (ref 0.7–4.0)
MCH: 27.5 pg (ref 26.0–34.0)
MCHC: 32.4 g/dL (ref 30.0–36.0)
MCV: 84.7 fL (ref 78.0–100.0)
MONO ABS: 0.8 10*3/uL (ref 0.1–1.0)
Monocytes Relative: 11 %
NEUTROS ABS: 2.6 10*3/uL (ref 1.7–7.7)
Neutrophils Relative %: 36 %
PLATELETS: 193 10*3/uL (ref 150–400)
RBC: 5.24 MIL/uL — AB (ref 3.87–5.11)
RDW: 16.6 % — AB (ref 11.5–15.5)
WBC: 7.3 10*3/uL (ref 4.0–10.5)

## 2016-06-17 LAB — TSH: TSH: 0.957 u[IU]/mL (ref 0.350–4.500)

## 2016-06-17 LAB — PROTIME-INR
INR: 1.06
Prothrombin Time: 13.8 seconds (ref 11.4–15.2)

## 2016-06-17 LAB — D-DIMER, QUANTITATIVE (NOT AT ARMC): D DIMER QUANT: 0.87 ug{FEU}/mL — AB (ref 0.00–0.50)

## 2016-06-17 LAB — CBG MONITORING, ED: GLUCOSE-CAPILLARY: 90 mg/dL (ref 65–99)

## 2016-06-17 MED ORDER — TECHNETIUM TO 99M ALBUMIN AGGREGATED
4.4000 | Freq: Once | INTRAVENOUS | Status: AC | PRN
  Administered 2016-06-17: 4 via INTRAVENOUS

## 2016-06-17 MED ORDER — TECHNETIUM TC 99M DIETHYLENETRIAME-PENTAACETIC ACID: 30.5000 | Freq: Once | INTRAVENOUS | Status: DC | PRN

## 2016-06-17 MED ORDER — SODIUM CHLORIDE 0.9 % IV BOLUS (SEPSIS)
1000.0000 mL | Freq: Once | INTRAVENOUS | Status: AC
  Administered 2016-06-17: 1000 mL via INTRAVENOUS

## 2016-06-17 NOTE — ED Triage Notes (Addendum)
Pt reports she was at a doctor's office, accompanying a family member, became dizzy and had a syncopal episode.  Pt denies any pain at this time.  Pt's speech is slow d/t CVA in March.  Pt is A&O x4.

## 2016-06-17 NOTE — ED Notes (Signed)
Patient transported to X-ray 

## 2016-06-17 NOTE — Discharge Instructions (Signed)
Please stay hydrated.  Please do not take your beta blocker with your muscle relaxant.  Please see her PCP for further discussions on optimizing medications for further workup of your passing out (syncope).  If symptoms return or worsen, please return to the nearest emergency department.

## 2016-06-17 NOTE — ED Provider Notes (Signed)
[facility] DEPT Provider Note   CSN: [id number] Arrival date & time: 06/17/16  1015     History   Chief Complaint Chief Complaint  Patient presents with  . Loss of Consciousness    HPI SU DUMA is a 50 y.o. female with past medical history significant for stroke, hypertension, diabetes, PE, ITP, and GERD who presents from a doctor's office for syncope. Patient is currently by family who report that patient was with her mother at the mother's doctor appointment when the patient stood up and passed out. Patient hit her head on the way to the floor. Release syncopal that was witnessed. Patient denied any associated chest pain, shortness of breath, palpitations, nausea, vomiting, constipation, diarrhea or dysuria. Patient denies loss of bowel or bladder control. Patient found to be hypotensive and bradycardic in transport. Patient denies any complaints on arrival however, family feels patient is "speaking slower".  She does report taking medications as morning including beta blockers and muscle relaxants.   The history is provided by the patient, medical records, a relative and a parent. No language interpreter was used.  Loss of Consciousness   This is a new problem. The current episode started less than 1 hour ago. The problem occurs rarely. The problem has been resolved. She lost consciousness for a period of less than one minute. The problem is associated with standing up. Associated symptoms include light-headedness. Pertinent negatives include abdominal pain, back pain, bladder incontinence, bowel incontinence, chest pain, congestion, diaphoresis, dizziness, fever, focal sensory loss, focal weakness, headaches, nausea, palpitations, seizures, slurred speech, vomiting and weakness. She has tried nothing for the symptoms. The treatment provided no relief. Her past medical history is significant for CVA, DM and HTN. Her past medical history does not include CAD or seizures.    Past  Medical History:  Diagnosis Date  . Allergy   . Arthritis   . Asthma   . Candida esophagitis ([place])   . Chronic chest pain   . Depression   . Essential hypertension, benign   . GERD (gastroesophageal reflux disease)   . Gout   . Hemorrhoids   . 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  Last was Prednisone '40mg'$ ; 2 wk in 10/2012.  She also was on Prednisone bridged with Cellcept briefly but stopped due to lack of insurance.   . Normal cardiac stress test 02/2014  . Pulmonary embolism ([place]) 04/2012  . Renal insufficiency   . Serrated adenoma of colon   . Steroid-induced diabetes ([place])   . Stroke ([place])   . Thrush, oral 05/29/2011    Patient Active Problem List   Diagnosis Date Noted  . Carpal tunnel syndrome, left   . C7 radiculopathy 01/07/2016  . History of stroke   . Chronic back pain 12/23/2015  . Cerebrovascular accident (CVA) due to embolism ([place]) 12/16/2015  . Back pain 11/22/2015  . Asthma, chronic 02/16/2015  . Anxiety state 02/16/2015  . CKD (chronic kidney disease) stage 3, GFR 30-59 ml/min 04/28/2014  . Nausea with vomiting 01/25/2013  . Abnormal LFTs 01/25/2013  . Orthostatic hypotension 01/25/2013  . Unspecified constipation 12/27/2012  . Rectal bleeding 12/26/2012  . PE (pulmonary embolism) 05/03/2012  . Tachycardia 06/04/2011  . Hemorrhoids 06/04/2011  . 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 Immunosuppressed status ([place]) 06/04/2011  . Depression 06/04/2011  . Obesity 06/04/2011  . Hypertension 03/18/2011  . ITP (idiopathic thrombocytopenic purpura)  03/18/2011    Past Surgical History:  Procedure Laterality Date  . BONE MARROW BIOPSY    . CARPAL TUNNEL RELEASE    . CARPAL TUNNEL RELEASE Left 05/20/2016   Procedure: CARPAL TUNNEL RELEASE;  Surgeon: Carole Civil, MD;  Location: [facility] ORS;  Service: Orthopedics;   Laterality: Left;  . CATARACT EXTRACTION    . CHOLECYSTECTOMY  2008  . COLONOSCOPY WITH ESOPHAGOGASTRODUODENOSCOPY (EGD) N/A 01/04/2013   Dr. Gala Romney: esophageal plaques with +KOH, hh. Gastric antrum abnormal, bx reactive gastropathy. Anal canal hemorrhoids, colonic tics, normal TI, single polyp (sessile serrated tubular adenoma). Next TCS 12/2017  . 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 TEE WITHOUT CARDIOVERSION N/A 01/03/2016   Procedure: TRANSESOPHAGEAL ECHOCARDIOGRAM (TEE) WITH PROPOFOL;  Surgeon: Satira Sark, MD;  Location: [facility] ENDO SUITE;  Service: Cardiovascular;  Laterality: N/A;    OB History    No data available       Home Medications    Prior to Admission medications   Medication Sig Start Date End Date Taking? Authorizing Provider  aspirin EC 81 MG tablet Take 1 tablet (81 mg total) by mouth daily. 01/23/16   Pieter Partridge, DO  atorvastatin (LIPITOR) 40 MG tablet Take 40 mg by mouth daily.    Historical Provider, MD  cephALEXin (KEFLEX) 500 MG capsule Take 1 capsule (500 mg total) by mouth 2 (two) times daily. 06/03/16   Carole Civil, MD  colestipol (COLESTID) 1 g tablet Take 1 tablet (1 g total) by mouth 2 (two) times daily before a meal. 03/16/16   Manus Gunning, MD  furosemide (LASIX) 20 MG tablet Take 20-40 mg by mouth 2 (two) times daily as needed for fluid. Reported on 01/15/2016 12/16/14   Historical Provider, MD  HYDROcodone-acetaminophen (NORCO) 7.5-325 MG tablet Take 1 tablet by mouth every 6 (six) hours as needed for moderate pain. 05/26/16   Carole Civil, MD  HYDROcodone-acetaminophen (NORCO/VICODIN) 5-325 MG tablet Take 1 tablet by mouth every 6 (six) hours as needed for moderate pain. 06/02/16   Carole Civil, MD  ipratropium-albuterol (DUONEB) 0.5-2.5 (3) MG/3ML SOLN Take 3 mLs by nebulization 3 (three) times daily as needed (shorntess of breath/wheezing). 02/18/15   Kathie Dike, MD  KLOR-CON 10 10 MEQ tablet Take 10 mEq by mouth as needed (Only takes  with the furosemide). Reported on 01/15/2016 11/13/14   Historical Provider, MD  levothyroxine (SYNTHROID, LEVOTHROID) 50 MCG tablet Take 1 tablet (50 mcg total) by mouth daily. 11/28/15   Hoyt Koch, MD  metoprolol succinate (TOPROL-XL) 50 MG 24 hr tablet Take 1 tablet (50 mg total) by mouth daily. 01/24/16   Satira Sark, MD  ondansetron (ZOFRAN ODT) 4 MG disintegrating tablet '4mg'$  ODT q4 hours prn nausea/vomit 04/29/16   Milton Ferguson, MD  OVER THE COUNTER MEDICATION Place 1 drop into both eyes daily as needed (Dry Eyes).    Historical Provider, MD  oxyCODONE-acetaminophen (ROXICET) 5-325 MG tablet Take 1 tablet by mouth every 4 (four) hours as needed for severe pain. 05/20/16   Carole Civil, MD  predniSONE (DELTASONE) 10 MG tablet Take 1 tablet (10 mg total) by mouth 2 (two) times daily with a meal. 06/10/16   Carole Civil, MD  PROAIR RESPICLICK 938 (90 BASE) MCG/ACT AEPB Inhale 2 puffs into the lungs every 4 (four) hours as needed (Shortness of breath).  12/27/14   Historical Provider, MD  sertraline (ZOLOFT) 50 MG tablet Take 150 mg by mouth daily. 04/19/16  Historical Provider, MD  sertraline (ZOLOFT) 50 MG tablet TAKE 3 TABLETS(150 MG) BY MOUTH DAILY 06/16/16   Hoyt Koch, MD  tiZANidine (ZANAFLEX) 4 MG tablet TAKE 1 TABLET BY MOUTH EVERY 6 HOURS AS NEEDED FOR MUSCLE SPASMS 06/10/16   Carole Civil, MD    Family History Family History  Problem Relation Age of Onset  . Cervical cancer Mother   . Lung cancer Father   . Colon polyps Father     ???  . Pneumonia Brother   . Colon cancer Paternal Grandmother 88  . AAA (abdominal aortic aneurysm) Paternal Grandmother   . Breast cancer Paternal Grandmother   . Colon cancer Paternal Grandfather 53  . Barrett's esophagus Paternal Aunt   . Stomach cancer Neg Hx   . Pancreatic cancer Neg Hx     Social History Social History  Substance Use Topics  . Smoking status: Former Smoker    Years: 0.00    Types:  Cigarettes    Quit date: 02/14/2009  . Smokeless tobacco: Never Used  . Alcohol use No     Allergies   Brintellix [vortioxetine]; Yellow jacket venom; and Adhesive [tape]   Review of Systems Review of Systems  Constitutional: Negative for appetite change, chills, diaphoresis and fever.  HENT: Negative for congestion and rhinorrhea.   Eyes: Negative for photophobia.  Respiratory: Negative for chest tightness, shortness of breath, wheezing and stridor.   Cardiovascular: Positive for syncope. Negative for chest pain, palpitations and leg swelling.  Gastrointestinal: Negative for abdominal pain, bowel incontinence, constipation, diarrhea, nausea and vomiting.  Genitourinary: Negative for bladder incontinence, dysuria and flank pain.  Musculoskeletal: Negative for back pain and neck pain.  Skin: Negative for rash and wound.  Neurological: Positive for syncope and light-headedness. Negative for dizziness, tremors, focal weakness, seizures, speech difficulty, weakness, numbness and headaches.  Psychiatric/Behavioral: Negative for agitation.  All other systems reviewed and are negative.    Physical Exam Updated Vital Signs BP (!) 84/60   Pulse (!) 59   Temp 97.6 F (36.4 C) (Oral)   Resp 17   Ht '5\' 5"'$  (1.651 m)   Wt 188 lb (85.3 kg)   LMP 05/13/2011   SpO2 94%   BMI 31.28 kg/m   Physical Exam  Constitutional: She is oriented to person, place, and time. She appears well-developed and well-nourished. No distress.  HENT:  Head: Normocephalic and atraumatic.  Mouth/Throat: Oropharynx is clear and moist. No oropharyngeal exudate.  Eyes: Conjunctivae are normal.  Neck: Normal range of motion. Neck supple.  Cardiovascular: Regular rhythm, normal heart sounds and intact distal pulses.  Bradycardia present.  Exam reveals no gallop.   No murmur heard. Pulmonary/Chest: Effort normal and breath sounds normal. No stridor. No respiratory distress. She has no wheezes. She has no rales. She  exhibits no tenderness.  Abdominal: Soft. There is no tenderness.  Musculoskeletal: She exhibits no edema or tenderness.  Neurological: She is alert and oriented to person, place, and time. She has normal strength and normal reflexes. She displays normal reflexes. No cranial nerve deficit or sensory deficit. She exhibits normal muscle tone. Coordination and gait (pt walked with normal gait and no lighgtheadedness) normal. GCS eye subscore is 4. GCS verbal subscore is 5. GCS motor subscore is 6.  Skin: Skin is warm and dry. Capillary refill takes less than 2 seconds. No rash noted. She is not diaphoretic.  Psychiatric: She has a normal mood and affect.  Nursing note and vitals reviewed.  ED Treatments / Results  Labs (all labs ordered are listed, but only abnormal results are displayed) Labs Reviewed  CBC WITH DIFFERENTIAL/PLATELET - Abnormal; Notable for the following:       Result Value   RBC 5.24 (*)    RDW 16.6 (*)    All other components within normal limits  COMPREHENSIVE METABOLIC PANEL - Abnormal; Notable for the following:    BUN 40 (*)    Creatinine, Ser 2.00 (*)    Calcium 8.8 (*)    Alkaline Phosphatase 138 (*)    GFR calc non Af Amer 28 (*)    GFR calc Af Amer 32 (*)    All other components within normal limits  URINALYSIS, ROUTINE W REFLEX MICROSCOPIC (NOT AT [facility]) - Abnormal; Notable for the following:    APPearance CLOUDY (*)    Leukocytes, UA SMALL (*)    All other components within normal limits  D-DIMER, QUANTITATIVE (NOT AT [facility]) - Abnormal; Notable for the following:    D-Dimer, Quant 0.87 (*)    All other components within normal limits  URINE MICROSCOPIC-ADD ON - Abnormal; Notable for the following:    Squamous Epithelial / LPF 6-30 (*)    Bacteria, UA RARE (*)    All other components within normal limits  PROTIME-INR  TSH  POCT CBG (FASTING - GLUCOSE)-MANUAL ENTRY  I-STAT TROPOININ, ED  CBG MONITORING, ED  I-STAT TROPOININ, ED  I-STAT TROPOININ, ED     EKG  EKG Interpretation  Date/Time:  Wednesday June 17 2016 10:37:27 EDT Ventricular Rate:  51 PR Interval:    QRS Duration: 101 QT Interval:  579 QTC Calculation: 534 R Axis:   0 Text Interpretation:  Sinus rhythm Anteroseptal infarct, age indeterminate Prolonged QT interval Sinus bradycardia Confirmed by Sherry Ruffing MD, Siddhanth Denk [id number]) on 06/17/2016 10:54:41 AM       Radiology Dg Chest 2 View  Result Date: 06/17/2016 CLINICAL DATA:  Syncope EXAM: CHEST  2 VIEW COMPARISON:  12/16/2015 FINDINGS: Cardiomediastinal silhouette is stable. No acute infiltrate or pleural effusion. No pulmonary edema. Again noted compression deformities in lower thoracic spine. IMPRESSION: No active cardiopulmonary disease. Electronically Signed   By: Lahoma Crocker M.D.   On: 06/17/2016 11:23   Ct Head Wo Contrast  Result Date: 06/17/2016 CLINICAL DATA:  Dizziness and syncope today. EXAM: CT HEAD WITHOUT CONTRAST TECHNIQUE: Contiguous axial images were obtained from the base of the skull through the vertex without intravenous contrast. COMPARISON:  Head CT scan and brain MRI 12/16/2015. FINDINGS: Brain: Focus of hypoattenuation is seen in the deep white matter of the right frontal lobe consistent with evolutionary change of small infarct seen on prior MRI. A small and remote deep white matter infarct in the left corona radiata is also identified. No evidence of acute infarction, hemorrhage, mass lesion, mass effect, midline shift or abnormal extra-axial fluid collection. No hydrocephalus or pneumocephalus. Vascular: Unremarkable. Skull: Intact. Sinuses/Orbits: The patient is status post bilateral lens extraction. Otherwise unremarkable. Other: None. IMPRESSION: No acute abnormality. Remote and small deep white matter infarctions are as seen on prior MRI. Electronically Signed   By: Inge Rise M.D.   On: 06/17/2016 11:27   Nm Pulmonary Perf And Vent  Result Date: 06/17/2016 CLINICAL DATA:  Syncope.   History of PE 2 years ago. EXAM: NUCLEAR MEDICINE VENTILATION - PERFUSION LUNG SCAN TECHNIQUE: Ventilation images were obtained in multiple projections using inhaled aerosol Tc-25mDTPA. Perfusion images were obtained in multiple projections after intravenous injection of Tc-963mAA.  RADIOPHARMACEUTICALS:  30.5 mCi Technetium-69mDTPA aerosol inhalation and 4.2 mCi Technetium-972mAA IV COMPARISON:  Chest x-ray from earlier today FINDINGS: Ventilation: No focal ventilation defect. Perfusion: No wedge shaped peripheral perfusion defects to suggest acute pulmonary embolism. IMPRESSION: Negative for pulmonary embolism. Electronically Signed   By: JoMonte Fantasia.D.   On: 06/17/2016 15:32    Procedures Procedures (including critical care time)  Medications Ordered in ED Medications  technetium TC 39M diethylenetriame-pentaacetic acid (DTPA) injection 3016.1illicurie (not administered)  sodium chloride 0.9 % bolus 1,000 mL (0 mLs Intravenous Stopped 06/17/16 1152)  technetium albumin aggregated (MAA) injection solution 4 millicurie (4 millicuries Intravenous Contrast Given 06/17/16 1443)     Initial Impression / Assessment and Plan / ED Course  I have reviewed the triage vital signs and the nursing notes.  Pertinent labs & imaging results that were available during my care of the patient were reviewed by me and considered in my medical decision making (see chart for details).  Clinical Course    AnELYA TARQUINIOs a 89.o. female with past medical history significant for stroke, hypertension, diabetes, PE, ITP, and GERD who presents from a doctor's office for syncope. Patient quickly brought back to exam room after arrival. Patient found to have bradycardia and some hypotension with a blood pressure of 84 systolic initially. Patient quickly had fluids started, and had EKG and chest x-ray with lab testing. EKG appears unchanged from prior aside from bradycardia.    History and exam as above.  Patient had nonfocal neurologic exam. Patient had no evidence of laceration or significant injury to back of head where she hit.   Patient had CT imaging of her head and aside from previous known stroke, no evidence of intracranial abnormality and no fracture. Chest x-ray showed no acute cardiopulmonary disease.  Laboratory testing results are as above. Patient found to have elevated d-dimer. In setting of prior PE, recent surgery, and elevated d-dimer, imaging was required. Patient's kidney function wasn't adequate for CT study, thus, VQ scan ordered. VQ scan was read as "negative for pulmonary embolus."  Laboratory testing otherwise showed normal glucose, negative troponin, normal TSH, no evidence of anemia or leukocytosis, and INR normal. Patient was found to have creatinine of 2.0 which is slightly worse from prior. This is known to the patient. Electrodes otherwise not significantly abnormal.  Shared decision-making conversation was held with patient after imaging studies return. Patient expressed desire to be discharged however, given her previous bradycardia in relation to syncope, she was advised for possible admission. Patient had resolution of bradycardia and heart rate was in the 60s, she was observed to walk around 80 without lightheadedness, and she reported no symptoms. Patient understood risks of discharge including further episodes of syncope or death, however, patient felt that outpatient workup and follow-up including seeing her PCP in the immediate future was the preferred option.   Patient advised not to take her muscle relaxant and her beta blocker at the same time as it is felt this contributed to her syncope. Patient also advised and hydration and prevention of orthostatic hypotension.  Patient and family understood return precautions for new or recurrent symptoms and understood plan of care. Patient had no questions or concerns and patient discharged in good condition with  understanding of risks of discharge.    Final Clinical Impressions(s) / ED Diagnoses   Final diagnoses:  Syncope    New Prescriptions Discharge Medication List as of 06/17/2016  5:09 PM  Clinical Impression: 1. Syncope     Disposition: Discharge  Condition: Good  I have discussed the results, Dx and Tx plan with the pt(& family if present). He/she/they expressed understanding and agree(s) with the plan. Discharge instructions discussed at great length. Strict return precautions discussed and pt &/or family have verbalized understanding of the instructions. No further questions at time of discharge.    Discharge Medication List as of 06/17/2016  5:09 PM      Follow Up: Hoyt Koch, MD [address] [place] [address] [phone]  Schedule an appointment as soon as possible for a visit  If symptoms worsen, please return to the nearest ED.     Gwenyth Allegra Doloras [place], MD 06/17/16 1755

## 2016-06-17 NOTE — ED Notes (Signed)
Bed: LK95 Expected date:  Expected time:  Means of arrival:  Comments: HOLD RESB

## 2016-06-17 NOTE — ED Notes (Signed)
Patient transported to [facility] 

## 2016-06-26 IMAGING — DX DG CHEST 2V
2 series · 2 of 2 positions shown · non-contrast
Comparison: 10/22/2014

CLINICAL DATA: New diarrhea and dehydration

EXAM:
CHEST  2 VIEW

[chest pa]
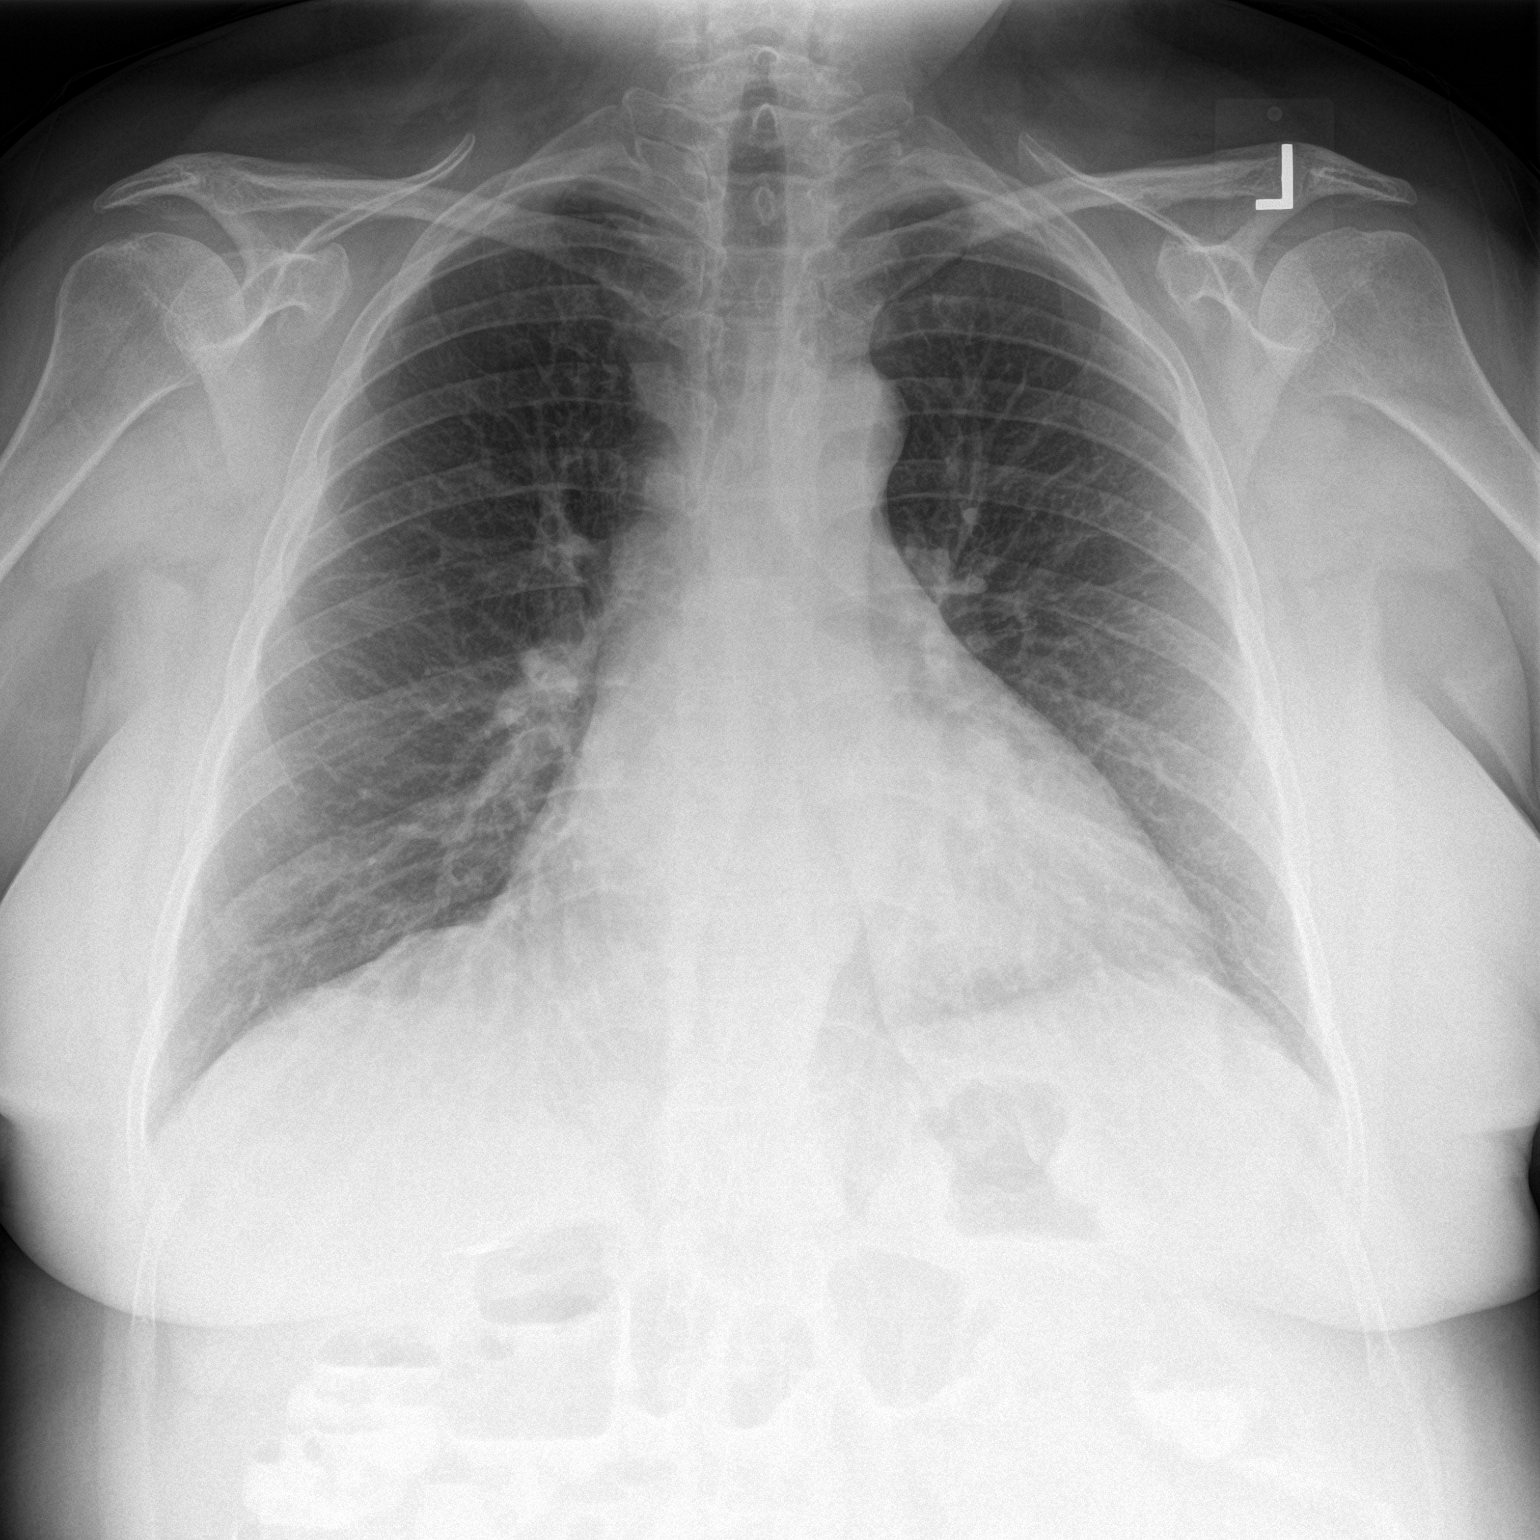

[chest lat]
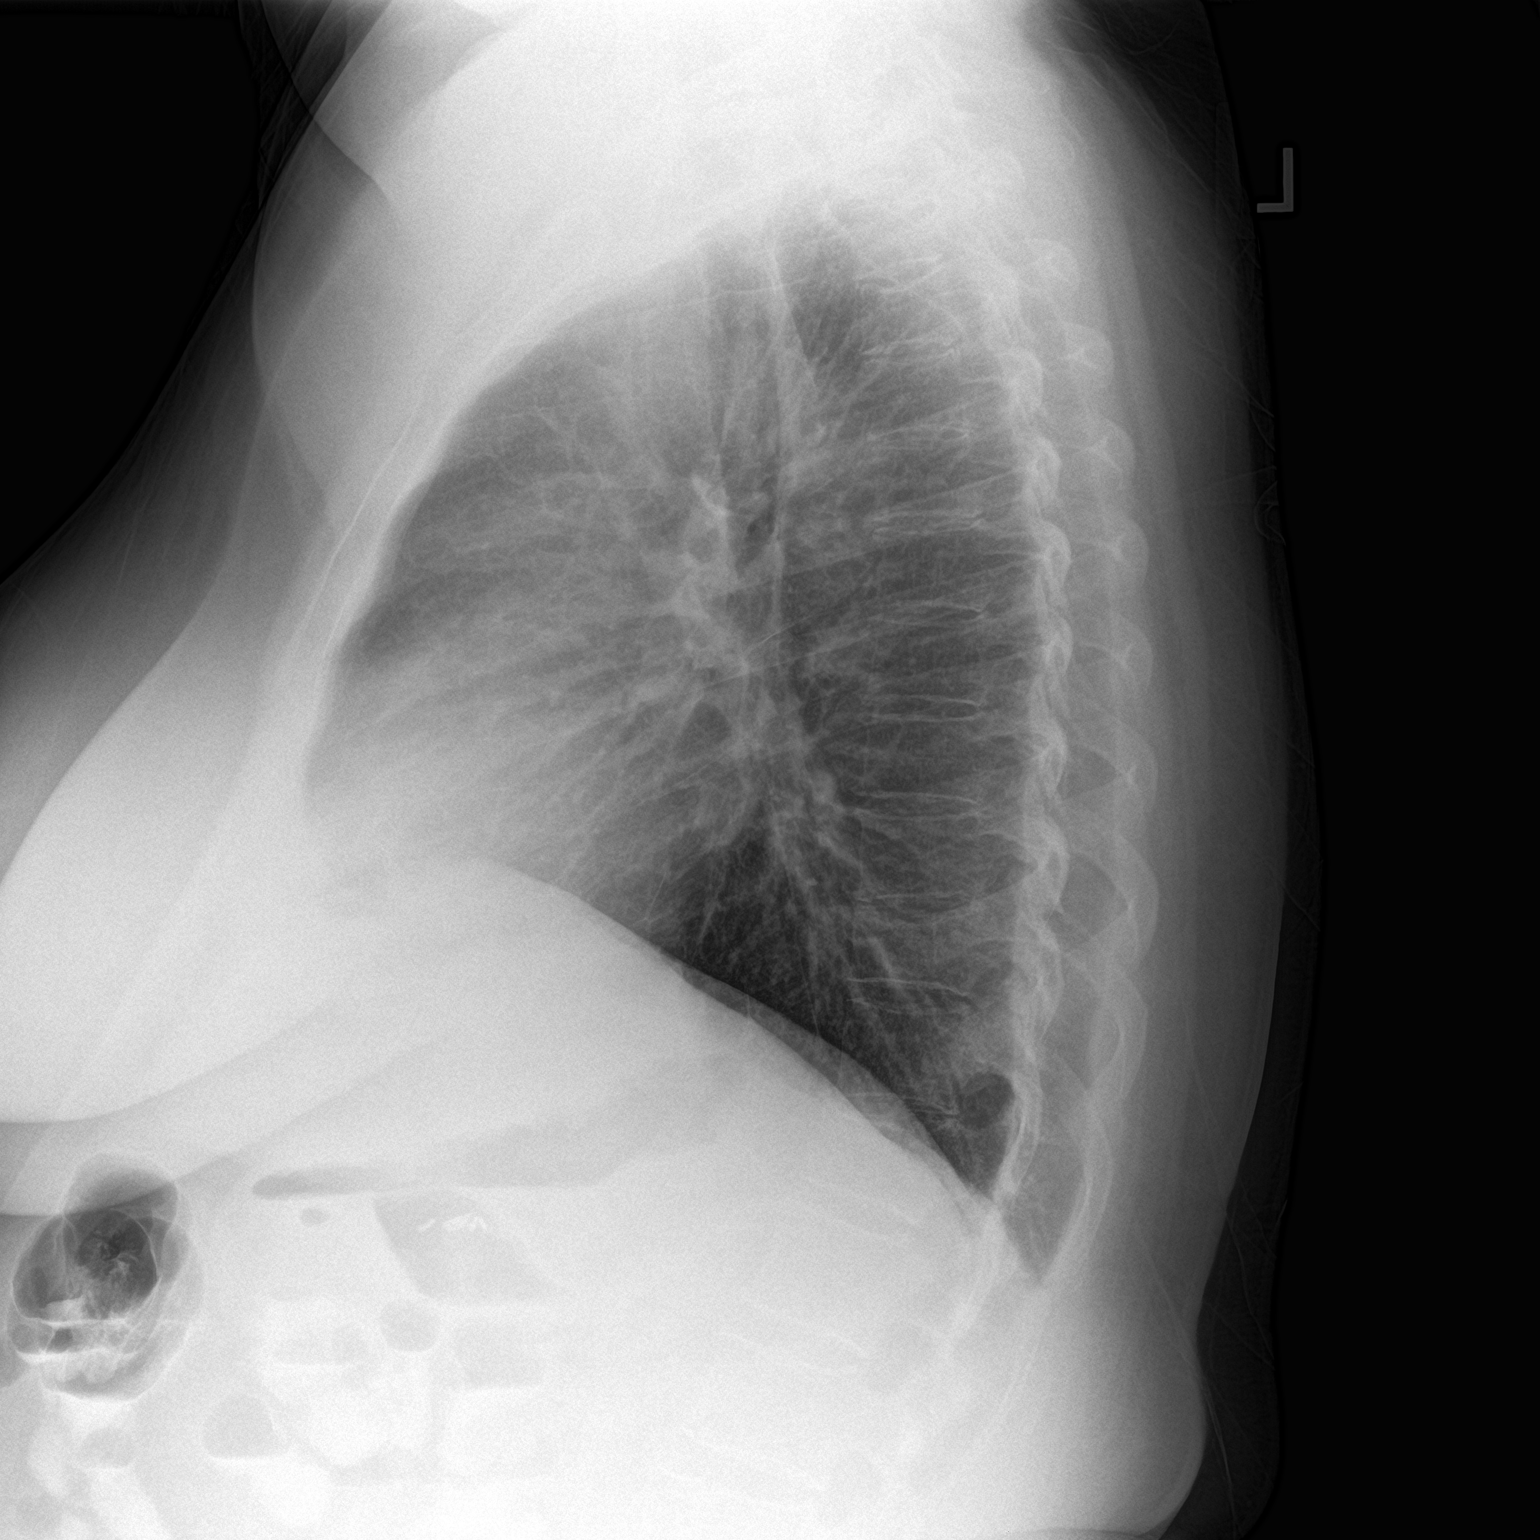

[2 of 2 positions shown; findings below may reference images not displayed]

FINDINGS: The heart size and mediastinal contours are within normal limits.
Both lungs are clear. The visualized skeletal structures are
unremarkable.
IMPRESSION: No active cardiopulmonary disease.

## 2016-06-26 IMAGING — CT CT ABD-PELV W/ CM
2 of 5 series · 17 of 46 positions shown, 19 images · IV contrast (Omnipaque 300)
Comparison: CT abdomen and pelvis 01/21/2013.

CLINICAL DATA: Generalized abdominal pain for several days.

EXAM:
CT ABDOMEN AND PELVIS WITH CONTRAST
TECHNIQUE: Multidetector CT imaging of the abdomen and pelvis was performed
using the standard protocol following bolus administration of
intravenous contrast.
CONTRAST:  25mL OMNIPAQUE IOHEXOL 300 MG/ML SOLN, 100mL OMNIPAQUE
IOHEXOL 300 MG/ML SOLN

[Series 2: abd_pel_with 5.0 b40s · axial · 0.88mm/px · z∈[-496,-82]mm · 14 of 95 slices shown, 16 images]
[im 6/95  soft-tissue]
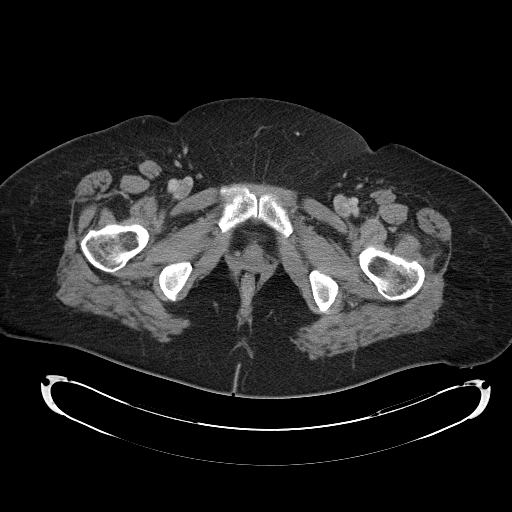
[im 6/95  bone]
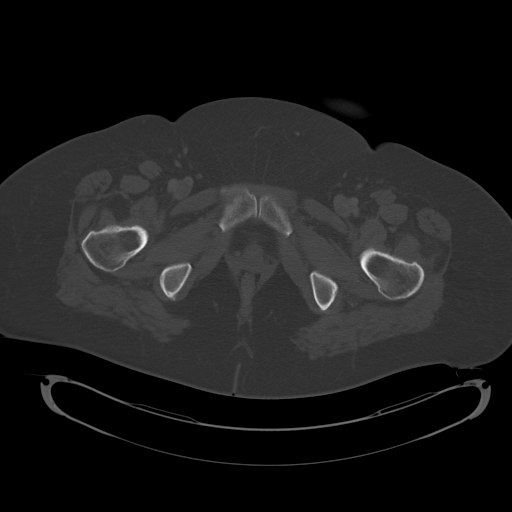
[im 12/95  soft-tissue]
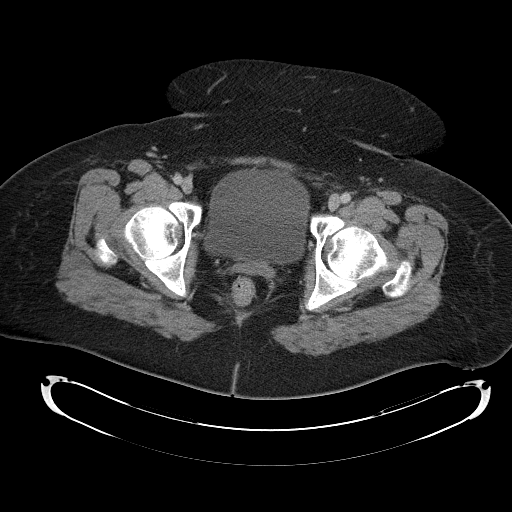
[im 17/95  soft-tissue]
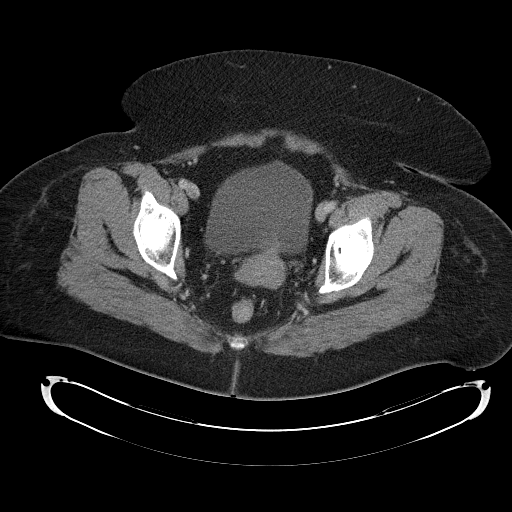
[im 28/95  soft-tissue]
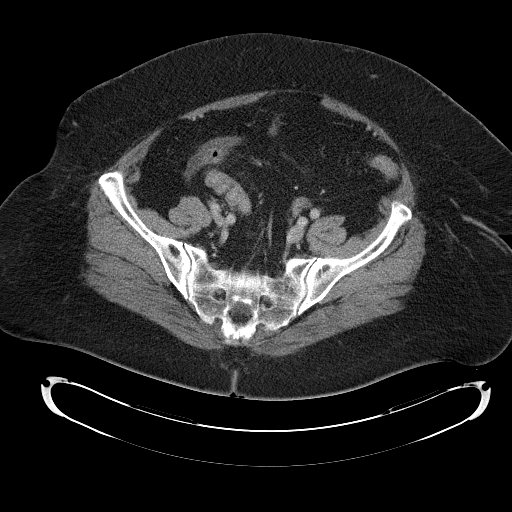
[im 34/95  soft-tissue]
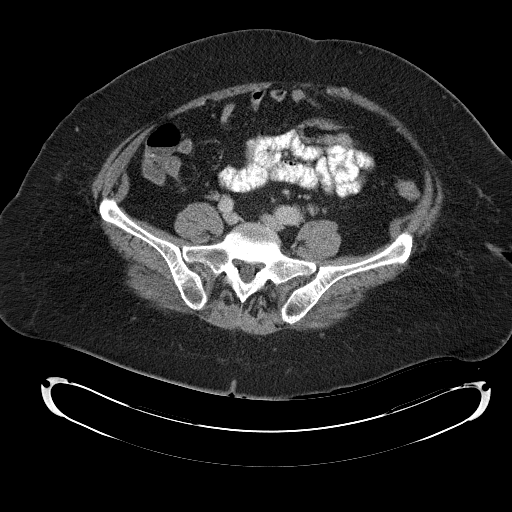
[im 39/95  soft-tissue]
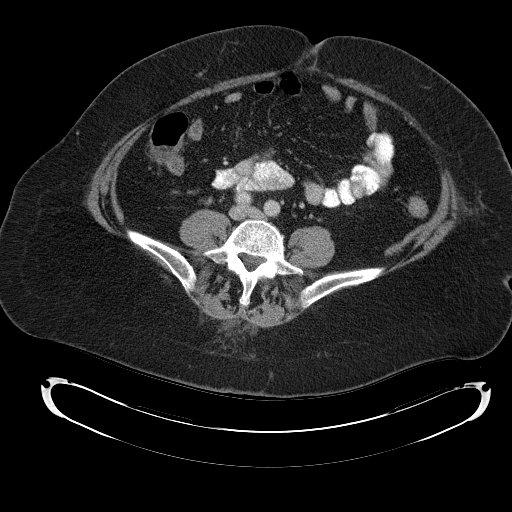
[im 45/95  soft-tissue]
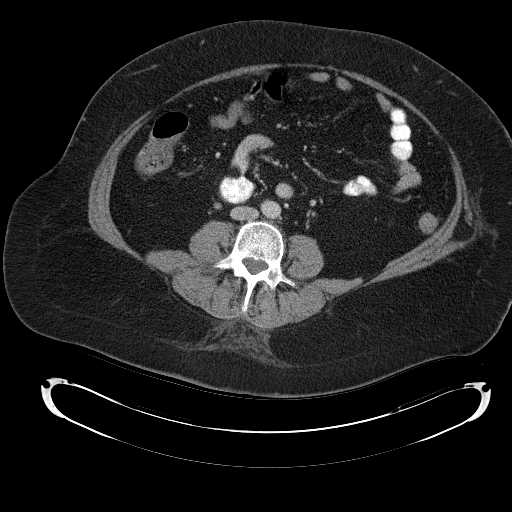
[im 50/95  soft-tissue]
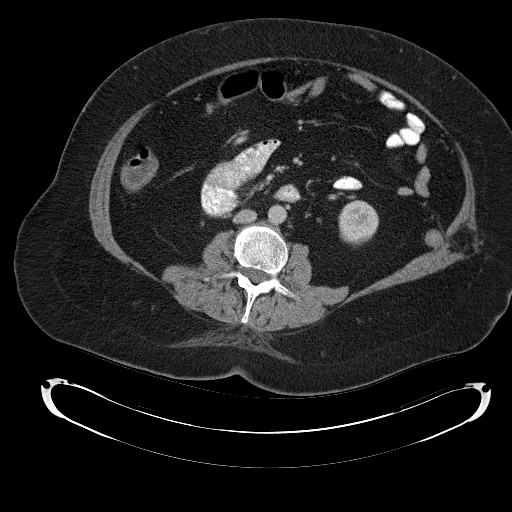
[im 56/95  soft-tissue]
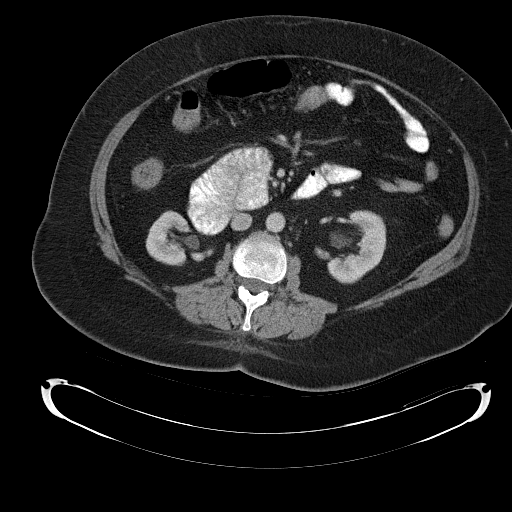
[im 56/95  bone]
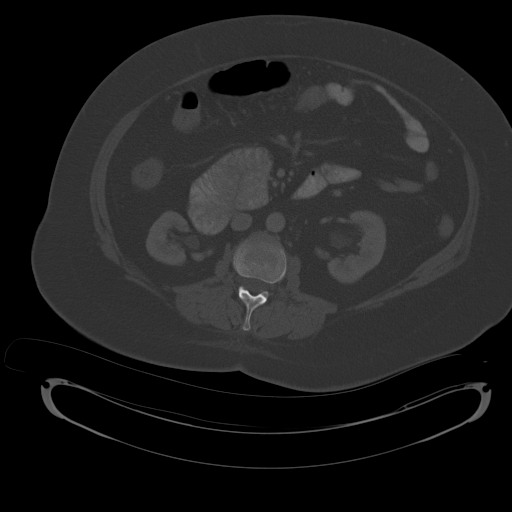
[im 61/95  soft-tissue]
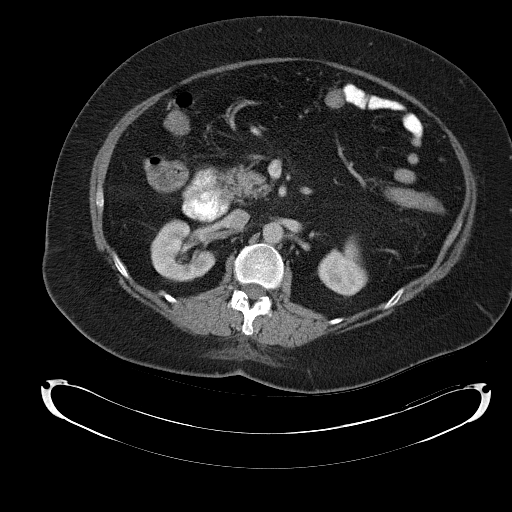
[im 72/95  soft-tissue]
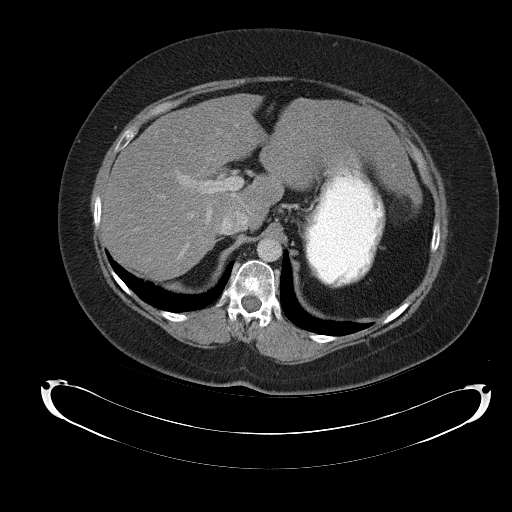
[im 78/95  soft-tissue]
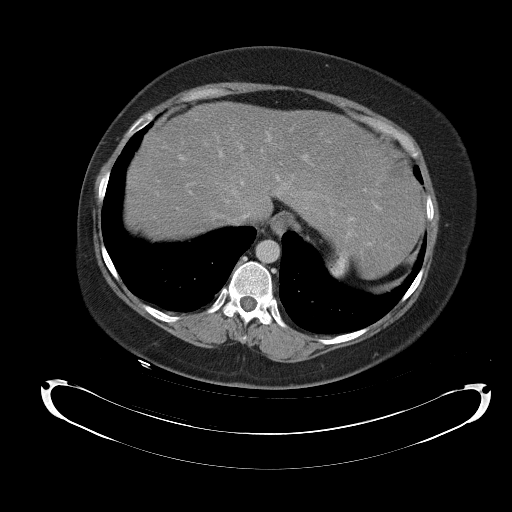
[im 83/95  soft-tissue]
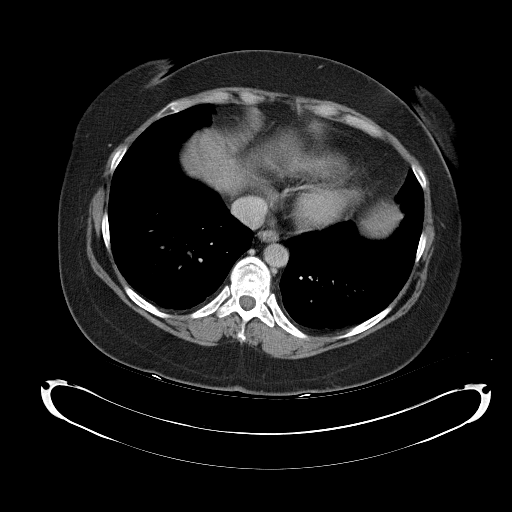
[im 89/95  soft-tissue]
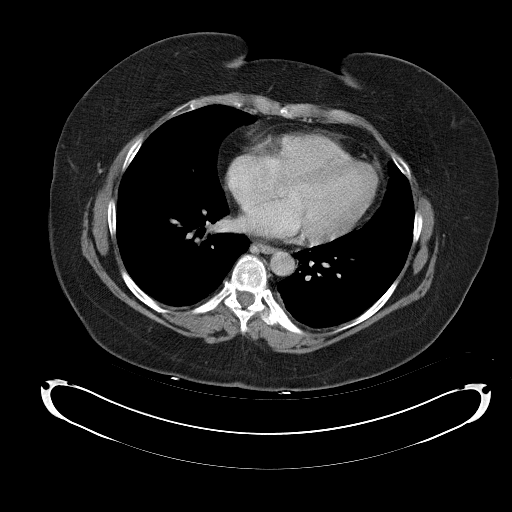

[Series 3: mpr cor post contrast (id) · coronal · 0.92mm/px · 3 of 120 slices shown]
[im 40/120  soft-tissue]
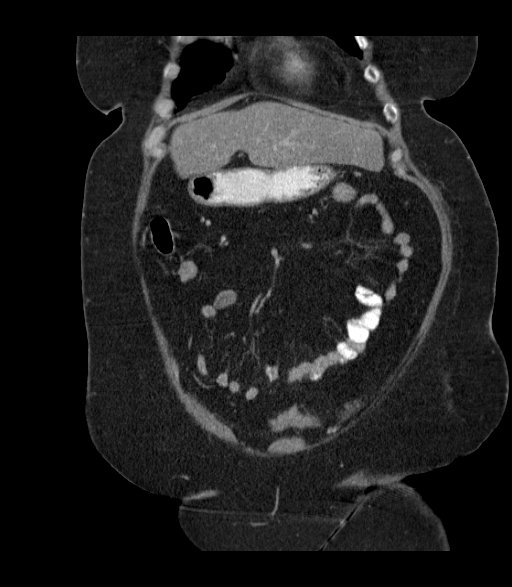
[im 53/120  soft-tissue]
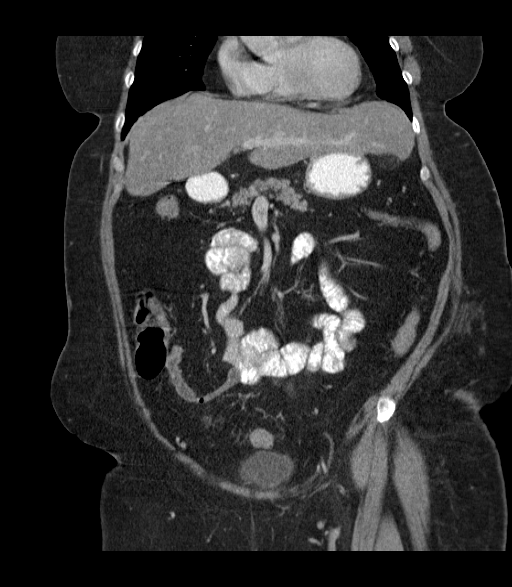
[im 67/120  soft-tissue]
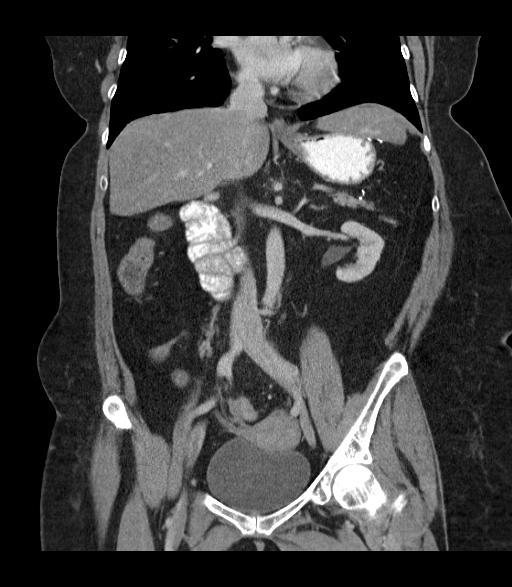

[17 of 46 positions shown; findings below may reference images not displayed]

FINDINGS: BODY WALL: Unremarkable. Tiny umbilical hernia containing only fat.
The

LOWER CHEST: Patchy lung base opacity, early infiltrate not
excluded. No effusion.

ABDOMEN/PELVIS:

Liver: No focal abnormality.

Biliary: Prior cholecystectomy.

Pancreas: Unremarkable.

Spleen: Prior splenectomy.

Adrenals: Unremarkable.

Kidneys and ureters: No hydronephrosis or stone.

Bladder: Unremarkable.

Reproductive: Unremarkable.

Bowel: No obstruction. No appendiceal inflammation. Partial
malrotation of the small bowel.

Retroperitoneum: No mass or adenopathy.

Peritoneum: No free fluid or gas.

Vascular: No acute abnormality.

OSSEOUS: No acute abnormalities. Chronic T10 and T12 compression
deformities.
IMPRESSION: Patchy RIGHT lower lobe opacities could represent early infiltrate.

Status postcholecystectomy and splenectomy. No acute intra-abdominal
findings.

## 2016-07-03 ENCOUNTER — Encounter (HOSPITAL_COMMUNITY): Payer: Medicare HMO | Attending: Hematology & Oncology

## 2016-07-03 DIAGNOSIS — D693 Immune thrombocytopenic purpura: Secondary | ICD-10-CM | POA: Diagnosis not present

## 2016-07-03 LAB — CBC
HCT: 46.9 % — ABNORMAL HIGH (ref 36.0–46.0)
Hemoglobin: 15.4 g/dL — ABNORMAL HIGH (ref 12.0–15.0)
MCH: 28.2 pg (ref 26.0–34.0)
MCHC: 32.8 g/dL (ref 30.0–36.0)
MCV: 85.7 fL (ref 78.0–100.0)
PLATELETS: 119 10*3/uL — AB (ref 150–400)
RBC: 5.47 MIL/uL — ABNORMAL HIGH (ref 3.87–5.11)
RDW: 16.3 % — AB (ref 11.5–15.5)
WBC: 6.8 10*3/uL (ref 4.0–10.5)

## 2016-07-07 ENCOUNTER — Encounter: Payer: Self-pay | Admitting: *Deleted

## 2016-07-07 NOTE — Progress Notes (Unsigned)
[employer] DOES NOT REQUIRE PRE AUTHORIZATION FOR RIGHT CARPAL TUNNEL RELEASE  SCHEDULED FOR 07/16/16  REFERENCE # [id number]

## 2016-07-10 NOTE — Patient Instructions (Signed)
GRAY MAUGERI  07/10/2016     @PREFPERIOPPHARMACY @   Your procedure is scheduled on  07/16/2016   Report to [facility] at  615  A.M.  Call this number if you have problems the morning of surgery:  [phone]   Remember:  Do not eat food or drink liquids after midnight.  Take these medicines the morning of surgery with A SIP OF WATER  Levothyroxine, metorpolol, zofran, oxycodone, deltazone, zofran, zanaflex.   Do not wear jewelry, make-up or nail polish.  Do not wear lotions, powders, or perfumes, or deoderant.  Do not shave 48 hours prior to surgery.  Men may shave face and neck.  Do not bring valuables to the hospital.  [facility] is not responsible for any belongings or valuables.  Contacts, dentures or bridgework may not be worn into surgery.  Leave your suitcase in the car.  After surgery it may be brought to your room.  For patients admitted to the hospital, discharge time will be determined by your treatment team.  Patients discharged the day of surgery will not be allowed to drive home.   Name and phone number of your driver:   family Special instructions:  none Please read over the following fact sheets that you were given. Anesthesia Post-op Instructions and Care and Recovery After Surgery       Carpal Tunnel Release Carpal tunnel release is a surgical procedure to relieve numbness and pain in your hand that are caused by carpal tunnel syndrome. Your carpal tunnel is a narrow, hollow space in your wrist. It passes between your wrist bones and a band of connective tissue (transverse carpal ligament). The nerve that supplies most of your hand (median nerve) passes through this space, and so do the connections between your fingers and the muscles of your arm (tendons). Carpal tunnel syndrome makes this space swell and become narrow, and this causes pain and numbness. In carpal tunnel release surgery, a surgeon cuts through the transverse carpal ligament  to make more room in the carpal tunnel space. You may have this surgery if other types of treatment have not worked. LET [facility] CARE PROVIDER KNOW ABOUT:  Any allergies you have.  All medicines you are taking, including vitamins, herbs, eye drops, creams, and over-the-counter medicines.  Previous problems you or members of your family have had with the use of anesthetics.  Any blood disorders you have.  Previous surgeries you have had.  Medical conditions you have. RISKS AND COMPLICATIONS Generally, this is a safe procedure. However, problems may occur, including:  Bleeding.  Infection.  Injury to the median nerve.  Need for additional surgery. BEFORE THE PROCEDURE  Ask your health care provider about:  Changing or stopping your regular medicines. This is especially important if you are taking diabetes medicines or blood thinners.  Taking medicines such as aspirin and ibuprofen. These medicines can thin your blood. Do not take these medicines before your procedure if your health care provider instructs you not to.  Do not eat or drink anything after midnight on the night before the procedure or as directed by your health care provider.  Plan to have someone take you home after the procedure. PROCEDURE  An IV tube may be inserted into a vein.  You will be given one of the following:  A medicine that numbs the wrist area (local anesthetic). You may also be given a medicine to make you relax (sedative).  A medicine that makes you go to sleep (general anesthetic).  Your arm, hand, and wrist will be cleaned with a germ-killing solution (antiseptic).  Your surgeon will make a surgical cut (incision) over the palm side of your wrist. The surgeon will pull aside the skin of your wrist to expose the carpal tunnel space.  The surgeon will cut the transverse carpal ligament.  The edges of the incision will be closed with stitches (sutures) or staples.  A bandage  (dressing) will be placed over your wrist and wrapped around your hand and wrist. AFTER THE PROCEDURE  You may spend some time in a recovery area.  Your blood pressure, heart rate, breathing rate, and blood oxygen level will be monitored often until the medicines you were given have worn off.  You will likely have some pain. You will be given pain medicine.  You may need to wear a splint or a wrist brace over your dressing.   This information is not intended to replace advice given to you by your health care provider. Make sure you discuss any questions you have with your health care provider.   Document Released: 12/05/2003 Document Revised: 10/05/2014 Document Reviewed: 05/02/2014 Elsevier Interactive Patient Education 2016 [place] After Refer to this sheet in the next few weeks. These instructions provide you with information about caring for yourself after your procedure. Your health care provider may also give you more specific instructions. Your treatment has been planned according to current medical practices, but problems sometimes occur. Call your health care provider if you have any problems or questions after your procedure. WHAT TO EXPECT AFTER THE PROCEDURE After your procedure, it is typical to have the following:  Pain.  Numbness.  Tingling.  Swelling.  Stiffness.  Bruising. HOME CARE INSTRUCTIONS  Take medicines only as directed by your health care provider.  There are many different ways to close and cover an incision, including stitches (sutures), skin glue, and adhesive strips. Follow your health care provider's instructions about:  Incision care.  Bandage (dressing) changes and removal.  Incision closure removal.  Wear a splint or a brace as directed by your surgeon. You may need to do this for 2-3 weeks.  Keep your hand raised (elevated) above the level of your heart while you are resting. Move your fingers  often.  Avoid activities that cause hand pain.  Ask your surgeon when you can start to do all of your usual activities again, such as:  Driving.  Returning to work.  Bathing and swimming.  Keep all follow-up visits as directed by your health care provider. This is important. You may need physical therapy for several months to speed healing and regain movement. SEEK MEDICAL CARE IF:  You have drainage, redness, swelling, or pain at your incision site.  You have a fever.  You have chills.  Your pain medicine is not working.  Your symptoms do not go away after 2 months.  Your symptoms go away and then return. SEEK IMMEDIATE MEDICAL CARE IF:  You have pain or numbness that is getting worse.  Your fingers change color.  You are not able to move your fingers.   This information is not intended to replace advice given to you by your health care provider. Make sure you discuss any questions you have with your health care provider.   Document Released: 04/03/2005 Document Revised: 10/05/2014 Document Reviewed: 05/02/2014 Elsevier Interactive Patient Education 2016 [place] INSTRUCTIONS POST-ANESTHESIA  IMMEDIATELY  FOLLOWING SURGERY:  Do not drive or operate machinery for the first twenty four hours after surgery.  Do not make any important decisions for twenty four hours after surgery or while taking narcotic pain medications or sedatives.  If you develop intractable nausea and vomiting or a severe headache please notify your doctor immediately.  FOLLOW-UP:  Please make an appointment with your surgeon as instructed. You do not need to follow up with anesthesia unless specifically instructed to do so.  WOUND CARE INSTRUCTIONS (if applicable):  Keep a dry clean dressing on the anesthesia/puncture wound site if there is drainage.  Once the wound has quit draining you may leave it open to air.  Generally you should leave the bandage intact for twenty four hours unless  there is drainage.  If the epidural site drains for more than 36-48 hours please call the anesthesia department.  QUESTIONS?:  Please feel free to call your physician or the hospital operator if you have any questions, and they will be happy to assist you.

## 2016-07-11 ENCOUNTER — Encounter (HOSPITAL_COMMUNITY): Payer: Self-pay | Admitting: *Deleted

## 2016-07-11 ENCOUNTER — Emergency Department (HOSPITAL_COMMUNITY)
Admission: EM | Admit: 2016-07-11 | Discharge: 2016-07-11 | Disposition: A | Discharge status: Home / Self Care | Payer: Medicare HMO | Attending: Emergency Medicine | Admitting: Emergency Medicine

## 2016-07-11 DIAGNOSIS — S80862A Insect bite (nonvenomous), left lower leg, initial encounter: Secondary | ICD-10-CM | POA: Insufficient documentation

## 2016-07-11 DIAGNOSIS — Z7982 Long term (current) use of aspirin: Secondary | ICD-10-CM | POA: Diagnosis not present

## 2016-07-11 DIAGNOSIS — Z87891 Personal history of nicotine dependence: Secondary | ICD-10-CM | POA: Insufficient documentation

## 2016-07-11 DIAGNOSIS — Y999 Unspecified external cause status: Secondary | ICD-10-CM | POA: Insufficient documentation

## 2016-07-11 DIAGNOSIS — Y939 Activity, unspecified: Secondary | ICD-10-CM | POA: Insufficient documentation

## 2016-07-11 DIAGNOSIS — Y929 Unspecified place or not applicable: Secondary | ICD-10-CM | POA: Diagnosis not present

## 2016-07-11 DIAGNOSIS — W57XXXA Bitten or stung by nonvenomous insect and other nonvenomous arthropods, initial encounter: Secondary | ICD-10-CM | POA: Insufficient documentation

## 2016-07-11 DIAGNOSIS — R21 Rash and other nonspecific skin eruption: Secondary | ICD-10-CM | POA: Diagnosis present

## 2016-07-11 DIAGNOSIS — N183 Chronic kidney disease, stage 3 (moderate): Secondary | ICD-10-CM | POA: Insufficient documentation

## 2016-07-11 DIAGNOSIS — S40862A Insect bite (nonvenomous) of left upper arm, initial encounter: Secondary | ICD-10-CM | POA: Insufficient documentation

## 2016-07-11 DIAGNOSIS — I129 Hypertensive chronic kidney disease with stage 1 through stage 4 chronic kidney disease, or unspecified chronic kidney disease: Secondary | ICD-10-CM | POA: Insufficient documentation

## 2016-07-11 DIAGNOSIS — Z79899 Other long term (current) drug therapy: Secondary | ICD-10-CM | POA: Insufficient documentation

## 2016-07-11 DIAGNOSIS — J45909 Unspecified asthma, uncomplicated: Secondary | ICD-10-CM | POA: Diagnosis not present

## 2016-07-11 DIAGNOSIS — Z8673 Personal history of transient ischemic attack (TIA), and cerebral infarction without residual deficits: Secondary | ICD-10-CM | POA: Diagnosis not present

## 2016-07-11 DIAGNOSIS — S80861A Insect bite (nonvenomous), right lower leg, initial encounter: Secondary | ICD-10-CM | POA: Diagnosis not present

## 2016-07-11 MED ORDER — TRIAMCINOLONE ACETONIDE 0.1 % EX CREA: 1.0000 "application " | TOPICAL_CREAM | Freq: Two times a day (BID) | CUTANEOUS | 0 refills | Status: DC

## 2016-07-11 MED ORDER — DOUBLE ANTIBIOTIC 500-10000 UNIT/GM EX OINT
TOPICAL_OINTMENT | Freq: Once | CUTANEOUS | Status: AC
  Administered 2016-07-11: 1 via TOPICAL
  Filled 2016-07-11: qty 2

## 2016-07-11 NOTE — ED Triage Notes (Addendum)
Rash on left elbow, right leg, noted today, poor historian

## 2016-07-11 NOTE — ED Provider Notes (Signed)
[facility] DEPT Provider Note   CSN: [id number] Arrival date & time: 07/11/16  1418     History   Chief Complaint Chief Complaint  Patient presents with  . Rash    HPI Summer Cook is a 50 y.o. female.  The history is provided by the patient. No language interpreter was used.  Rash   This is a new problem. The current episode started yesterday. The problem is associated with nothing. There has been no fever. The rash is present on the right lower leg, right upper leg, left lower leg, left upper leg, left arm and right arm. The pain is mild. The pain has been constant since onset. Associated symptoms include itching. She has tried nothing for the symptoms. The treatment provided no relief.  Pt complains of a rash.  Pt complains of itching.  Pt reports she is worried that she has shingles.    Past Medical History:  Diagnosis Date  . Allergy   . Arthritis   . Asthma   . Candida esophagitis ([place])   . Chronic chest pain   . Depression   . Essential hypertension, benign   . GERD (gastroesophageal reflux disease)   . Gout   . Hemorrhoids   . 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  Last was Prednisone '40mg'$ ; 2 wk in 10/2012.  She also was on Prednisone bridged with Cellcept briefly but stopped due to lack of insurance.   . Normal cardiac stress test 02/2014  . Pulmonary embolism ([place]) 04/2012  . Renal insufficiency   . Serrated adenoma of colon   . Steroid-induced diabetes ([place])   . Stroke ([place])   . Thrush, oral 05/29/2011    Patient Active Problem List   Diagnosis Date Noted  . Carpal tunnel syndrome, left   . C7 radiculopathy 01/07/2016  . History of stroke   . Chronic back pain 12/23/2015  . Cerebrovascular accident (CVA) due to embolism ([place]) 12/16/2015  . Back pain 11/22/2015  . Asthma, chronic 02/16/2015  . Anxiety state 02/16/2015  . CKD (chronic  kidney disease) stage 3, GFR 30-59 ml/min 04/28/2014  . Nausea with vomiting 01/25/2013  . Abnormal LFTs 01/25/2013  . Orthostatic hypotension 01/25/2013  . Unspecified constipation 12/27/2012  . Rectal bleeding 12/26/2012  . PE (pulmonary embolism) 05/03/2012  . Tachycardia 06/04/2011  . Hemorrhoids 06/04/2011  . 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 Immunosuppressed status ([place]) 06/04/2011  . Depression 06/04/2011  . Obesity 06/04/2011  . Hypertension 03/18/2011  . ITP (idiopathic thrombocytopenic purpura) 03/18/2011    Past Surgical History:  Procedure Laterality Date  . BONE MARROW BIOPSY    . CARPAL TUNNEL RELEASE    . CARPAL TUNNEL RELEASE Left 05/20/2016   Procedure: CARPAL TUNNEL RELEASE;  Surgeon: Carole Civil, MD;  Location: [facility] ORS;  Service: Orthopedics;  Laterality: Left;  . CATARACT EXTRACTION    . CHOLECYSTECTOMY  2008  . COLONOSCOPY WITH ESOPHAGOGASTRODUODENOSCOPY (EGD) N/A 01/04/2013   Dr. Gala Romney: esophageal plaques with +KOH, hh. Gastric antrum abnormal, bx reactive gastropathy. Anal canal hemorrhoids, colonic tics, normal TI, single polyp (sessile serrated tubular adenoma). Next TCS 12/2017  . 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 TEE WITHOUT CARDIOVERSION N/A 01/03/2016   Procedure: TRANSESOPHAGEAL ECHOCARDIOGRAM (TEE) WITH PROPOFOL;  Surgeon: Satira Sark, MD;  Location: [facility] ENDO SUITE;  Service: Cardiovascular;  Laterality: N/A;    OB History    No  data available       Home Medications    Prior to Admission medications   Medication Sig Start Date End Date Taking? Authorizing Provider  aspirin EC 81 MG tablet Take 1 tablet (81 mg total) by mouth daily. 01/23/16   Pieter Partridge, DO  cephALEXin (KEFLEX) 500 MG capsule Take 1 capsule (500 mg total) by mouth 2 (two) times daily. Patient not taking: Reported on 07/07/2016 06/03/16   Carole Civil, MD  furosemide (LASIX) 20 MG tablet Take 20-40 mg by mouth 2 (two) times  daily as needed for fluid. Reported on 01/15/2016 12/16/14   Historical Provider, MD  ipratropium-albuterol (DUONEB) 0.5-2.5 (3) MG/3ML SOLN Take 3 mLs by nebulization 3 (three) times daily as needed (shorntess of breath/wheezing). Patient taking differently: Take 3 mLs by nebulization 3 (three) times daily as needed (shortness of breath/wheezing).  02/18/15   Kathie Dike, MD  KLOR-CON 10 10 MEQ tablet Take 10 mEq by mouth as needed (Only takes with the furosemide). Reported on 01/15/2016 11/13/14   Historical Provider, MD  levothyroxine (SYNTHROID, LEVOTHROID) 50 MCG tablet Take 1 tablet (50 mcg total) by mouth daily. 11/28/15   Hoyt Koch, MD  metoprolol succinate (TOPROL-XL) 50 MG 24 hr tablet Take 1 tablet (50 mg total) by mouth daily. Patient not taking: Reported on 07/07/2016 01/24/16   Satira Sark, MD  ondansetron (ZOFRAN ODT) 4 MG disintegrating tablet '4mg'$  ODT q4 hours prn nausea/vomit Patient not taking: Reported on 06/17/2016 04/29/16   Milton Ferguson, MD  oxyCODONE-acetaminophen (ROXICET) 5-325 MG tablet Take 1 tablet by mouth every 4 (four) hours as needed for severe pain. Patient not taking: Reported on 06/17/2016 05/20/16   Carole Civil, MD  polyvinyl alcohol (LIQUIFILM TEARS) 1.4 % ophthalmic solution Place 1 drop into both eyes 3 (three) times daily as needed for dry eyes.     Historical Provider, MD  predniSONE (DELTASONE) 10 MG tablet Take 1 tablet (10 mg total) by mouth 2 (two) times daily with a meal. Patient not taking: Reported on 07/07/2016 06/10/16   Carole Civil, MD  PROAIR RESPICLICK 465 (90 BASE) MCG/ACT AEPB Inhale 2 puffs into the lungs every 4 (four) hours as needed (Shortness of breath).  12/27/14   Historical Provider, MD  sertraline (ZOLOFT) 50 MG tablet TAKE 3 TABLETS(150 MG) BY MOUTH DAILY Patient not taking: Reported on 07/07/2016 06/16/16   Hoyt Koch, MD  tiZANidine (ZANAFLEX) 4 MG tablet TAKE 1 TABLET BY MOUTH EVERY 6 HOURS AS NEEDED FOR  MUSCLE SPASMS 06/10/16   Carole Civil, MD  triamcinolone cream (KENALOG) 0.1 % Apply 1 application topically 2 (two) times daily. 07/11/16   Fransico Meadow, PA-C  vitamin B-12 (CYANOCOBALAMIN) 1000 MCG tablet Take 1,500 mcg by mouth daily.    Historical Provider, MD    Family History Family History  Problem Relation Age of Onset  . Cervical cancer Mother   . Lung cancer Father   . Colon polyps Father     ???  . Pneumonia Brother   . Colon cancer Paternal Grandmother 88  . AAA (abdominal aortic aneurysm) Paternal Grandmother   . Breast cancer Paternal Grandmother   . Colon cancer Paternal Grandfather 9  . Barrett's esophagus Paternal Aunt   . Stomach cancer Neg Hx   . Pancreatic cancer Neg Hx     Social History Social History  Substance Use Topics  . Smoking status: Former Smoker    Years: 0.00    Types:  Cigarettes    Quit date: 02/14/2009  . Smokeless tobacco: Never Used  . Alcohol use No     Allergies   Brintellix [vortioxetine]; Yellow jacket venom; and Adhesive [tape]   Review of Systems Review of Systems  Skin: Positive for itching and rash.  All other systems reviewed and are negative.    Physical Exam Updated Vital Signs BP 135/99 (BP Location: Left Arm)   Pulse 112   Temp 98.4 F (36.9 C) (Oral)   Resp 18   Ht '5\' 5"'$  (1.651 m)   Wt 84.5 kg   LMP 05/13/2011   SpO2 95%   BMI 31.00 kg/m   Physical Exam  Constitutional: She is oriented to person, place, and time. She appears well-developed and well-nourished.  Musculoskeletal: Normal range of motion.  Neurological: She is alert and oriented to person, place, and time.  Skin: There is erythema.  Red raised areas bilat lower legs,  Dark centers,  Looks like insect bites, 2 long scratches near areas,  Left arm, scattered red areas,  Smaller than others,  Does not appear herpetic.    Psychiatric: She has a normal mood and affect.     ED Treatments / Results  Labs (all labs ordered are listed,  but only abnormal results are displayed) Labs Reviewed - No data to display  EKG  EKG Interpretation None       Radiology No results found.  Procedures Procedures (including critical care time)  Medications Ordered in ED Medications  polymixin-bacitracin (POLYSPORIN) ointment (1 application Topical Given 07/11/16 1555)     Initial Impression / Assessment and Plan / ED Course  I have reviewed the triage vital signs and the nursing notes.  Pertinent labs & imaging results that were available during my care of the patient were reviewed by me and considered in my medical decision making (see chart for details).  Clinical Course    I doubt systemic problems.  Most areas look like bites,  I doubt shingles due to distribution.   Pt given triamcinolone.   I advised bacitracin to area that are red and have been scrattched  Final Clinical Impressions(s) / ED Diagnoses   Final diagnoses:  Insect bite, initial encounter    New Prescriptions New Prescriptions   TRIAMCINOLONE CREAM (KENALOG) 0.1 %    Apply 1 application topically 2 (two) times daily.  An After Visit Summary was printed and given to the patient.   Hollace Kinnier [place], PA-C 07/11/16 [place], MD 07/12/16 [phone]

## 2016-07-13 ENCOUNTER — Encounter (HOSPITAL_COMMUNITY): Payer: Self-pay

## 2016-07-13 ENCOUNTER — Encounter (HOSPITAL_COMMUNITY)
Admission: RE | Admit: 2016-07-13 | Discharge: 2016-07-13 | Disposition: A | Discharge status: Home / Self Care | Payer: Medicare HMO | Source: Ambulatory Visit | Attending: Orthopedic Surgery | Admitting: Orthopedic Surgery

## 2016-07-13 DIAGNOSIS — Z01812 Encounter for preprocedural laboratory examination: Secondary | ICD-10-CM

## 2016-07-13 DIAGNOSIS — J45909 Unspecified asthma, uncomplicated: Secondary | ICD-10-CM | POA: Diagnosis not present

## 2016-07-13 DIAGNOSIS — M1990 Unspecified osteoarthritis, unspecified site: Secondary | ICD-10-CM | POA: Diagnosis not present

## 2016-07-13 DIAGNOSIS — T380X5A Adverse effect of glucocorticoids and synthetic analogues, initial encounter: Secondary | ICD-10-CM | POA: Diagnosis not present

## 2016-07-13 DIAGNOSIS — F329 Major depressive disorder, single episode, unspecified: Secondary | ICD-10-CM | POA: Diagnosis not present

## 2016-07-13 DIAGNOSIS — Z7952 Long term (current) use of systemic steroids: Secondary | ICD-10-CM | POA: Diagnosis not present

## 2016-07-13 DIAGNOSIS — E099 Drug or chemical induced diabetes mellitus without complications: Secondary | ICD-10-CM | POA: Diagnosis not present

## 2016-07-13 DIAGNOSIS — I10 Essential (primary) hypertension: Secondary | ICD-10-CM | POA: Diagnosis not present

## 2016-07-13 DIAGNOSIS — M109 Gout, unspecified: Secondary | ICD-10-CM | POA: Diagnosis not present

## 2016-07-13 DIAGNOSIS — G5601 Carpal tunnel syndrome, right upper limb: Secondary | ICD-10-CM | POA: Diagnosis not present

## 2016-07-13 DIAGNOSIS — Z86711 Personal history of pulmonary embolism: Secondary | ICD-10-CM | POA: Diagnosis not present

## 2016-07-13 DIAGNOSIS — Z79899 Other long term (current) drug therapy: Secondary | ICD-10-CM | POA: Diagnosis not present

## 2016-07-13 DIAGNOSIS — K219 Gastro-esophageal reflux disease without esophagitis: Secondary | ICD-10-CM | POA: Diagnosis not present

## 2016-07-13 DIAGNOSIS — Z87891 Personal history of nicotine dependence: Secondary | ICD-10-CM | POA: Diagnosis not present

## 2016-07-13 DIAGNOSIS — Z8673 Personal history of transient ischemic attack (TIA), and cerebral infarction without residual deficits: Secondary | ICD-10-CM | POA: Diagnosis not present

## 2016-07-13 DIAGNOSIS — R69 Illness, unspecified: Secondary | ICD-10-CM | POA: Diagnosis not present

## 2016-07-13 LAB — CBC WITH DIFFERENTIAL/PLATELET
BASOS ABS: 0.1 10*3/uL (ref 0.0–0.1)
Basophils Relative: 1 %
EOS ABS: 0.2 10*3/uL (ref 0.0–0.7)
EOS PCT: 2 %
HCT: 44.2 % (ref 36.0–46.0)
Hemoglobin: 14.3 g/dL (ref 12.0–15.0)
LYMPHS PCT: 43 %
Lymphs Abs: 3.6 10*3/uL (ref 0.7–4.0)
MCH: 27.8 pg (ref 26.0–34.0)
MCHC: 32.4 g/dL (ref 30.0–36.0)
MCV: 86 fL (ref 78.0–100.0)
Monocytes Absolute: 0.9 10*3/uL (ref 0.1–1.0)
Monocytes Relative: 11 %
Neutro Abs: 3.5 10*3/uL (ref 1.7–7.7)
Neutrophils Relative %: 43 %
PLATELETS: 165 10*3/uL (ref 150–400)
RBC: 5.14 MIL/uL — AB (ref 3.87–5.11)
RDW: 15.6 % — ABNORMAL HIGH (ref 11.5–15.5)
WBC: 8.2 10*3/uL (ref 4.0–10.5)

## 2016-07-13 LAB — BASIC METABOLIC PANEL
ANION GAP: 6 (ref 5–15)
BUN: 24 mg/dL — ABNORMAL HIGH (ref 6–20)
CO2: 25 mmol/L (ref 22–32)
Calcium: 8.9 mg/dL (ref 8.9–10.3)
Chloride: 107 mmol/L (ref 101–111)
Creatinine, Ser: 1.53 mg/dL — ABNORMAL HIGH (ref 0.44–1.00)
GFR, EST AFRICAN AMERICAN: 45 mL/min — AB (ref >60)
GFR, EST NON AFRICAN AMERICAN: 39 mL/min — AB (ref >60)
Glucose, Bld: 93 mg/dL (ref 65–99)
POTASSIUM: 4.4 mmol/L (ref 3.5–5.1)
SODIUM: 138 mmol/L (ref 135–145)

## 2016-07-15 NOTE — H&P (Signed)
Summer Cook is an 50 y.o. female.   Chief Complaint: Pain paresthesias right upper extremity HPI: 50 year old female status post left carpal tunnel release presents for right carpal tunnel release. Symptoms are consistent with carpal tunnel syndrome involving the right upper extremity with pain and paresthesias including nighttime increase in pain weakness and trouble holding onto small objects trouble with fine motor tasks. Symptoms are in the median nerve distribution of the right hand  Left carpal tunnel release was conjugated by a postoperative cellulitis treated with by mouth antibiotics.  The patient failed conservative treatment including bracing and medication.  Past Medical History:  Diagnosis Date  . Allergy   . Arthritis   . Asthma   . Candida esophagitis ([place])   . Chronic chest pain   . Depression   . Essential hypertension, benign   . GERD (gastroesophageal reflux disease)   . Gout   . Hemorrhoids   . 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  Last was Prednisone 78m; 2 wk in 10/2012.  She also was on Prednisone bridged with Cellcept briefly but stopped due to lack of insurance.   . Normal cardiac stress test 02/2014  . [place] 04/2012  . Renal insufficiency   . Serrated adenoma of colon   . Steroid-induced diabetes ([place]   . [place]   . Thrush, oral 05/29/2011    Past Surgical History:  Procedure Laterality Date  . BONE MARROW BIOPSY    . CARPAL TUNNEL RELEASE    . CARPAL TUNNEL RELEASE Left 05/20/2016   Procedure: CARPAL TUNNEL RELEASE;  Surgeon: SCarole Civil MD;  Location: [facility] ORS;  Service: Orthopedics;  Laterality: Left;  . CATARACT EXTRACTION    . CHOLECYSTECTOMY  2008  . COLONOSCOPY WITH ESOPHAGOGASTRODUODENOSCOPY (EGD) N/A 01/04/2013   Dr. RGala Romney esophageal plaques with +KOH, hh. Gastric antrum abnormal, bx reactive  gastropathy. Anal canal hemorrhoids, colonic tics, normal TI, single polyp (sessile serrated tubular adenoma). Next TCS 12/2017  . 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 TEE WITHOUT CARDIOVERSION N/A 01/03/2016   Procedure: TRANSESOPHAGEAL ECHOCARDIOGRAM (TEE) WITH PROPOFOL;  Surgeon: SSatira Sark MD;  Location: [facility] ENDO SUITE;  Service: Cardiovascular;  Laterality: N/A;    Family History  Problem Relation Age of Onset  . Cervical cancer Mother   . Lung cancer Father   . Colon polyps Father     ???  . Pneumonia Brother   . Colon cancer Paternal Grandmother 88  . AAA (abdominal aortic aneurysm) Paternal Grandmother   . Breast cancer Paternal Grandmother   . Colon cancer Paternal Grandfather 666 . Barrett's esophagus Paternal Aunt   . Stomach cancer Neg Hx   . Pancreatic cancer Neg Hx    Social History:  reports that she quit smoking about 7 years ago. Her smoking use included Cigarettes. She quit after 0.00 years of use. She has never used smokeless tobacco. She reports that she does not drink alcohol or use drugs.  Allergies:  Allergies  Allergen Reactions  . Brintellix [Vortioxetine] Itching  . Yellow Jacket Venom Swelling  . Adhesive [Tape] Rash    Please use paper tape    No prescriptions prior to admission.    No results found for this or any previous visit (from the past 48 hour(s)). No results found.  Review of Systems  Musculoskeletal: Positive for back pain.  Neurological: Positive for tingling and sensory change.  Weakness  All other systems reviewed and are negative.   Last menstrual period 05/13/2011. Physical Exam  Constitutional: She is oriented to person, place, and time. She appears well-nourished.  Eyes: Right eye exhibits no discharge. Left eye exhibits no discharge. No scleral icterus.  Neck: Neck supple. No JVD present. No tracheal deviation present.  Cardiovascular: Intact distal pulses.   Respiratory: Effort normal. No stridor.  GI: Soft. She  exhibits no distension.  Neurological: She is alert and oriented to person, place, and time. She has normal reflexes. She exhibits normal muscle tone. Coordination normal.  Skin: Skin is warm and dry. No rash noted. No erythema. No pallor.  Psychiatric: She has a normal mood and affect. Her behavior is normal. Thought content normal.    Right upper extremity decreased sensation in the thumb index long and part of the ring finger. Pain and tenderness over the carpal tunnel with positive Tinel's and compression testing. Positive Phalen's testing. Mild weakness. Color and capillary refill normal wrist is stable. Grip strength reveals mild weakness.  Lower extremities right and left normal alignment range of motion stability and strength.    Assessment/Plan Right carpal tunnel syndrome  Right carpal tunnel release  07/15/2016, 4:03 PM Arther Abbott, MD

## 2016-07-16 ENCOUNTER — Encounter (HOSPITAL_COMMUNITY): Payer: Self-pay | Admitting: Anesthesiology

## 2016-07-16 ENCOUNTER — Ambulatory Visit (HOSPITAL_COMMUNITY)
Admission: RE | Admit: 2016-07-16 | Discharge: 2016-07-16 | Disposition: A | Discharge status: Home / Self Care | Payer: Medicare HMO | Source: Ambulatory Visit | Attending: Orthopedic Surgery | Admitting: Orthopedic Surgery

## 2016-07-16 ENCOUNTER — Ambulatory Visit (HOSPITAL_COMMUNITY): Payer: Medicare HMO | Admitting: Anesthesiology

## 2016-07-16 ENCOUNTER — Encounter (HOSPITAL_COMMUNITY)
Admission: RE | Disposition: A | Discharge status: Home / Self Care | Payer: Self-pay | Source: Ambulatory Visit | Attending: Orthopedic Surgery

## 2016-07-16 DIAGNOSIS — M109 Gout, unspecified: Secondary | ICD-10-CM | POA: Diagnosis not present

## 2016-07-16 DIAGNOSIS — T380X5A Adverse effect of glucocorticoids and synthetic analogues, initial encounter: Secondary | ICD-10-CM | POA: Diagnosis not present

## 2016-07-16 DIAGNOSIS — Z8673 Personal history of transient ischemic attack (TIA), and cerebral infarction without residual deficits: Secondary | ICD-10-CM | POA: Insufficient documentation

## 2016-07-16 DIAGNOSIS — K219 Gastro-esophageal reflux disease without esophagitis: Secondary | ICD-10-CM | POA: Diagnosis not present

## 2016-07-16 DIAGNOSIS — Z7952 Long term (current) use of systemic steroids: Secondary | ICD-10-CM | POA: Insufficient documentation

## 2016-07-16 DIAGNOSIS — I10 Essential (primary) hypertension: Secondary | ICD-10-CM | POA: Insufficient documentation

## 2016-07-16 DIAGNOSIS — Z79899 Other long term (current) drug therapy: Secondary | ICD-10-CM | POA: Insufficient documentation

## 2016-07-16 DIAGNOSIS — E099 Drug or chemical induced diabetes mellitus without complications: Secondary | ICD-10-CM | POA: Insufficient documentation

## 2016-07-16 DIAGNOSIS — J45909 Unspecified asthma, uncomplicated: Secondary | ICD-10-CM | POA: Insufficient documentation

## 2016-07-16 DIAGNOSIS — Z86711 Personal history of pulmonary embolism: Secondary | ICD-10-CM | POA: Diagnosis not present

## 2016-07-16 DIAGNOSIS — F329 Major depressive disorder, single episode, unspecified: Secondary | ICD-10-CM | POA: Insufficient documentation

## 2016-07-16 DIAGNOSIS — G5601 Carpal tunnel syndrome, right upper limb: Secondary | ICD-10-CM | POA: Diagnosis not present

## 2016-07-16 DIAGNOSIS — R69 Illness, unspecified: Secondary | ICD-10-CM | POA: Diagnosis not present

## 2016-07-16 DIAGNOSIS — M1990 Unspecified osteoarthritis, unspecified site: Secondary | ICD-10-CM | POA: Diagnosis not present

## 2016-07-16 DIAGNOSIS — Z87891 Personal history of nicotine dependence: Secondary | ICD-10-CM | POA: Insufficient documentation

## 2016-07-16 HISTORY — PX: CARPAL TUNNEL RELEASE: SHX101

## 2016-07-16 SURGERY — CARPAL TUNNEL RELEASE
Anesthesia: Regional | Site: Wrist | Laterality: Right

## 2016-07-16 MED ORDER — FENTANYL CITRATE (PF) 100 MCG/2ML IJ SOLN: 25.0000 ug | INTRAMUSCULAR | Status: DC | PRN

## 2016-07-16 MED ORDER — OXYCODONE HCL 5 MG PO TABS: 5.0000 mg | ORAL_TABLET | Freq: Once | ORAL | Status: DC | PRN

## 2016-07-16 MED ORDER — CEFAZOLIN SODIUM-DEXTROSE 2-4 GM/100ML-% IV SOLN
INTRAVENOUS | Status: AC
  Filled 2016-07-16: qty 100

## 2016-07-16 MED ORDER — MIDAZOLAM HCL 2 MG/2ML IJ SOLN
INTRAMUSCULAR | Status: AC
  Filled 2016-07-16: qty 2

## 2016-07-16 MED ORDER — 0.9 % SODIUM CHLORIDE (POUR BTL) OPTIME
TOPICAL | Status: DC | PRN
  Administered 2016-07-16: 50 mL

## 2016-07-16 MED ORDER — LIDOCAINE HCL (PF) 0.5 % IJ SOLN
INTRAMUSCULAR | Status: DC | PRN
  Administered 2016-07-16: 45 mL via INTRAVENOUS

## 2016-07-16 MED ORDER — PROPOFOL 10 MG/ML IV BOLUS
INTRAVENOUS | Status: DC | PRN
  Administered 2016-07-16 (×3): 20 mg via INTRAVENOUS

## 2016-07-16 MED ORDER — PROPOFOL 10 MG/ML IV BOLUS
INTRAVENOUS | Status: AC
  Filled 2016-07-16: qty 20

## 2016-07-16 MED ORDER — OXYCODONE HCL 5 MG/5ML PO SOLN: 5.0000 mg | Freq: Once | ORAL | Status: DC | PRN

## 2016-07-16 MED ORDER — BUPIVACAINE HCL (PF) 0.5 % IJ SOLN
INTRAMUSCULAR | Status: AC
Start: 2016-07-16 — End: 2016-07-16
  Filled 2016-07-16: qty 30

## 2016-07-16 MED ORDER — LIDOCAINE HCL (PF) 1 % IJ SOLN
INTRAMUSCULAR | Status: AC
  Filled 2016-07-16: qty 5

## 2016-07-16 MED ORDER — MIDAZOLAM HCL 5 MG/5ML IJ SOLN
INTRAMUSCULAR | Status: DC | PRN
  Administered 2016-07-16: 2 mg via INTRAVENOUS

## 2016-07-16 MED ORDER — CEFAZOLIN SODIUM-DEXTROSE 2-4 GM/100ML-% IV SOLN
2.0000 g | INTRAVENOUS | Status: AC
  Administered 2016-07-16: 2 g via INTRAVENOUS

## 2016-07-16 MED ORDER — BUPIVACAINE HCL (PF) 0.5 % IJ SOLN
INTRAMUSCULAR | Status: DC | PRN
  Administered 2016-07-16: 10 mL

## 2016-07-16 MED ORDER — LIDOCAINE HCL (PF) 0.5 % IJ SOLN
INTRAMUSCULAR | Status: AC
  Filled 2016-07-16: qty 50

## 2016-07-16 MED ORDER — CHLORHEXIDINE GLUCONATE 4 % EX LIQD: 60.0000 mL | Freq: Once | CUTANEOUS | Status: DC

## 2016-07-16 MED ORDER — LACTATED RINGERS IV SOLN
INTRAVENOUS | Status: DC
  Administered 2016-07-16: 1000 mL via INTRAVENOUS
  Administered 2016-07-16: 07:00:00 via INTRAVENOUS

## 2016-07-16 MED ORDER — LIDOCAINE HCL (CARDIAC) 20 MG/ML IV SOLN
INTRAVENOUS | Status: DC | PRN
  Administered 2016-07-16: 10 mg via INTRATRACHEAL

## 2016-07-16 MED ORDER — HYDROCODONE-ACETAMINOPHEN 10-325 MG PO TABS: 1.0000 | ORAL_TABLET | ORAL | 0 refills | Status: DC | PRN

## 2016-07-16 MED ORDER — ONDANSETRON HCL 4 MG/2ML IJ SOLN: 4.0000 mg | Freq: Four times a day (QID) | INTRAMUSCULAR | Status: DC | PRN

## 2016-07-16 SURGICAL SUPPLY — 44 items
BAG HAMPER (MISCELLANEOUS) ×2 IMPLANT
BANDAGE ACE 3X5.8 VEL STRL LF (GAUZE/BANDAGES/DRESSINGS) ×1 IMPLANT
BANDAGE ELASTIC 3 LF NS (GAUZE/BANDAGES/DRESSINGS) ×2 IMPLANT
BANDAGE ESMARK 4X12 BL STRL LF (DISPOSABLE) ×1 IMPLANT
BLADE SURG 15 STRL LF DISP TIS (BLADE) ×1 IMPLANT
BLADE SURG 15 STRL SS (BLADE) ×2
BNDG CMPR 12X4 ELC STRL LF (DISPOSABLE) ×1
BNDG CMPR MED 5X3 ELC HKLP NS (GAUZE/BANDAGES/DRESSINGS) ×1
BNDG COHESIVE 4X5 TAN STRL (GAUZE/BANDAGES/DRESSINGS) ×2 IMPLANT
BNDG ESMARK 4X12 BLUE STRL LF (DISPOSABLE) ×2
BNDG GAUZE ELAST 4 BULKY (GAUZE/BANDAGES/DRESSINGS) IMPLANT
BNDG GAUZE ROLL STR 2.25X3YD (GAUZE/BANDAGES/DRESSINGS) ×1 IMPLANT
BNDG GZE SM 3X2.25 6 PLY (GAUZE/BANDAGES/DRESSINGS) ×1
CHLORAPREP W/TINT 26ML (MISCELLANEOUS) ×2 IMPLANT
CLOTH BEACON ORANGE TIMEOUT ST (SAFETY) ×2 IMPLANT
COVER LIGHT HANDLE STERIS (MISCELLANEOUS) ×4 IMPLANT
CUFF TOURNIQUET SINGLE 18IN (TOURNIQUET CUFF) ×2 IMPLANT
DECANTER SPIKE VIAL GLASS SM (MISCELLANEOUS) ×2 IMPLANT
DRAPE PROXIMA HALF (DRAPES) ×2 IMPLANT
DRSG XEROFORM 1X8 (GAUZE/BANDAGES/DRESSINGS) ×1 IMPLANT
ELECT NDL TIP 2.8 STRL (NEEDLE) ×1 IMPLANT
ELECT NEEDLE TIP 2.8 STRL (NEEDLE) ×2 IMPLANT
ELECT REM PT RETURN 9FT ADLT (ELECTROSURGICAL) ×2
ELECTRODE REM PT RTRN 9FT ADLT (ELECTROSURGICAL) ×1 IMPLANT
GAUZE SPONGE 4X4 12PLY STRL (GAUZE/BANDAGES/DRESSINGS) ×1 IMPLANT
GLOVE BIOGEL PI IND STRL 7.0 (GLOVE) ×1 IMPLANT
GLOVE BIOGEL PI INDICATOR 7.0 (GLOVE) ×1
GLOVE SKINSENSE NS SZ8.0 LF (GLOVE) ×1
GLOVE SKINSENSE STRL SZ8.0 LF (GLOVE) ×1 IMPLANT
GLOVE SS N UNI LF 8.5 STRL (GLOVE) ×2 IMPLANT
GOWN STRL REUS W/ TWL LRG LVL3 (GOWN DISPOSABLE) ×1 IMPLANT
GOWN STRL REUS W/TWL LRG LVL3 (GOWN DISPOSABLE) ×2
GOWN STRL REUS W/TWL XL LVL3 (GOWN DISPOSABLE) ×2 IMPLANT
HAND ALUMI XLG (SOFTGOODS) ×2 IMPLANT
KIT ROOM TURNOVER APOR (KITS) ×2 IMPLANT
MANIFOLD NEPTUNE II (INSTRUMENTS) ×2 IMPLANT
NDL HYPO 21X1.5 SAFETY (NEEDLE) ×1 IMPLANT
NEEDLE HYPO 21X1.5 SAFETY (NEEDLE) ×2 IMPLANT
NS IRRIG 1000ML POUR BTL (IV SOLUTION) ×2 IMPLANT
PACK BASIC LIMB (CUSTOM PROCEDURE TRAY) ×2 IMPLANT
PAD ARMBOARD 7.5X6 YLW CONV (MISCELLANEOUS) ×2 IMPLANT
SET BASIN LINEN APH (SET/KITS/TRAYS/PACK) ×2 IMPLANT
SUT ETHILON 3 0 FSL (SUTURE) ×2 IMPLANT
SYR CONTROL 10ML LL (SYRINGE) ×2 IMPLANT

## 2016-07-16 NOTE — Anesthesia Postprocedure Evaluation (Signed)
Anesthesia Post Note  Patient: Summer Cook  Procedure(s) Performed: Procedure(s) (LRB): RIGHT CARPAL TUNNEL RELEASE (Right)  Patient location during evaluation: PACU Anesthesia Type: MAC and Bier Block Level of consciousness: awake and alert Pain management: pain level controlled Vital Signs Assessment: post-procedure vital signs reviewed and stable Respiratory status: spontaneous breathing, nonlabored ventilation, respiratory function stable and patient connected to nasal cannula oxygen Cardiovascular status: stable and blood pressure returned to baseline Anesthetic complications: no    Last Vitals:  Vitals:   07/16/16 0830 07/16/16 0841  BP: 123/87 (!) 132/97  Pulse: 64 64  Resp: 11 12  Temp:  36.6 C    Last Pain:  Vitals:   07/16/16 0841  TempSrc: Oral                 Aiko Belko S

## 2016-07-16 NOTE — Op Note (Signed)
07/16/2016  7:58 AM  PATIENT:  Marcelino Freestone  50 y.o. female  PRE-OPERATIVE DIAGNOSIS:  right carpal tunnel syndrome  POST-OPERATIVE DIAGNOSIS:  right carpal tunnel syndrome  PROCEDURE:  Procedure(s): RIGHT CARPAL TUNNEL RELEASE (Right)  Carpal tunnel release right wrist  Preop diagnosis carpal tunnel syndrome right wrist postop diagnosis same  Procedure open carpal tunnel release right wrist  Surgeon Aline Brochure  Anesthesia regional Bier block  Operative findings compression of the right median nerveoccupied lesions  Indications failure of conservative treatment to relieve pain and paresthesias and numbness and tingling of the right hand.  The patient was identified in the preop area we confirm the surgical site marked as right wrist. Chart update completed. Patient taken to surgery. She had 2 g of Ancef. After establishing a Bier block her arm was prepped with ChloraPrep.  Timeout executed completed and confirmed site.  A straight incision was made over the carpal tunnel in line with the radial border of the ring finger. Blunt dissection was carried out to find the distal aspect of the carpal tunnel. A blunted judgment was passed beneath the carpal tunnel. Sharp incision was then used to release the transverse carpal ligament. The contents of the carpal tunnel were inspected. The median nerve was compressed with slight discoloration.  The wound was irrigated and then closed with 3-0 nylon suture. We injected 10 mL of plain Marcaine on the radial side of the incision  A sterile bandage was applied and the tourniquet was released the color of the hand and capillary refill were normal  The patient was taken to the recovery room in stable condition SURGEON:  Surgeon(s) and Role:    * Carole Civil, MD - Primary  PHYSICIAN ASSISTANT:   ASSISTANTS: none   ANESTHESIA:   regional  EBL:  No intake/output data recorded.  BLOOD ADMINISTERED:none  DRAINS: none   LOCAL  MEDICATIONS USED:  MARCAINE     SPECIMEN:  No Specimen  DISPOSITION OF SPECIMEN:  N/A  COUNTS:  YES  TOURNIQUET:  * Missing tourniquet times found for documented tourniquets in log:  [id number] *  DICTATION: .Dragon Dictation  PLAN OF CARE: Discharge to home after PACU  PATIENT DISPOSITION:  PACU - hemodynamically stable.   Delay start of Pharmacological VTE agent (>24hrs) due to surgical blood loss or risk of bleeding: not applicable  [id number]

## 2016-07-16 NOTE — Anesthesia Preprocedure Evaluation (Signed)
Anesthesia Evaluation  Patient identified by MRN, date of birth, ID band Patient awake    Reviewed: Allergy & Precautions, H&P , NPO status , Patient's Chart, lab work & pertinent test results, reviewed documented beta blocker date and time   Airway Mallampati: III  TM Distance: >3 FB Neck ROM: Full    Dental no notable dental hx. (+) Teeth Intact   Pulmonary asthma , former smoker,    Pulmonary exam normal breath sounds clear to auscultation       Cardiovascular hypertension, Pt. on medications and Pt. on home beta blockers + Peripheral Vascular Disease  Normal cardiovascular exam Rhythm:Regular Rate:Normal  EKG- 12/17/2015- NSR Anteroseptal infarct  Echo- 02/28/2013 Left ventricle: The cavity size was normal. Wall thickness was normal. Systolic function was normal. The estimated ejection fraction was in the range of 55% to 60%. Wall motion was normal; there were no regional wall motion abnormalities. Doppler parameters are consistent with abnormal left ventricular relaxation (grade 1 diastolic dysfunction). - Mitral valve: Mild regurgitation.  Hx/o SVT  Stress test 03/20/2014- normal   Neuro/Psych PSYCHIATRIC DISORDERS Anxiety Depression  Neuromuscular disease CVA, No Residual Symptoms    GI/Hepatic GERD  Medicated and Controlled,  Endo/Other  diabetes, Well Controlled, Type 2Steroid induced DM Obesity  Renal/GU Renal InsufficiencyRenal disease  negative genitourinary   Musculoskeletal  (+) Arthritis , Left CTS Gout Chronic back pain C7 radiculopathy   Abdominal (+) + obese,   Peds  Hematology Hx/o ITP S/P Splenectomy Hx/o PTE   Anesthesia Other Findings   Reproductive/Obstetrics negative OB ROS                             Anesthesia Physical  Anesthesia Plan  ASA: III  Anesthesia Plan: Bier Block   Post-op Pain Management:    Induction: Intravenous  Airway  Management Planned: Natural Airway and Nasal Cannula  Additional Equipment:   Intra-op Plan:   Post-operative Plan:   Informed Consent: I have reviewed the patients History and Physical, chart, labs and discussed the procedure including the risks, benefits and alternatives for the proposed anesthesia with the patient or authorized representative who has indicated his/her understanding and acceptance.   Dental advisory given  Plan Discussed with: CRNA, Anesthesiologist and Surgeon  Anesthesia Plan Comments:         Anesthesia Quick Evaluation

## 2016-07-16 NOTE — Discharge Instructions (Signed)
Carpal Tunnel Release, Care After Refer to this sheet in the next few weeks. These instructions provide you with information about caring for yourself after your procedure. Your health care provider may also give you more specific instructions. Your treatment has been planned according to current medical practices, but problems sometimes occur. Call your health care provider if you have any problems or questions after your procedure. WHAT TO EXPECT AFTER THE PROCEDURE After your procedure, it is typical to have the following:  Pain.  Numbness.  Tingling.  Swelling.  Stiffness.  Bruising. HOME CARE INSTRUCTIONS  Take medicines only as directed by your health care provider.  There are many different ways to close and cover an incision, including stitches (sutures), skin glue, and adhesive strips. Follow your health care provider's instructions about:  Incision care.  Bandage (dressing) changes and removal.  Incision closure removal.  Wear a splint or a brace as directed by your surgeon. You may need to do this for 2-3 weeks.  Keep your hand raised (elevated) above the level of your heart while you are resting. Move your fingers often.  Avoid activities that cause hand pain.  Ask your surgeon when you can start to do all of your usual activities again, such as:  Driving.  Returning to work.  Bathing and swimming.  Keep all follow-up visits as directed by your health care provider. This is important. You may need physical therapy for several months to speed healing and regain movement. SEEK MEDICAL CARE IF:  You have drainage, redness, swelling, or pain at your incision site.  You have a fever.  You have chills.  Your pain medicine is not working.  Your symptoms do not go away after 2 months.  Your symptoms go away and then return. SEEK IMMEDIATE MEDICAL CARE IF:  You have pain or numbness that is getting worse.  Your fingers change color.  You are not able  to move your fingers.   This information is not intended to replace advice given to you by your health care provider. Make sure you discuss any questions you have with your health care provider.   Document Released: 04/03/2005 Document Revised: 10/05/2014 Document Reviewed: 05/02/2014 Elsevier Interactive Patient Education [employer].

## 2016-07-16 NOTE — Interval H&P Note (Signed)
History and Physical Interval Note:  07/16/2016 7:18 AM  Summer Cook  has presented today for surgery, with the diagnosis of right carpal tunnel syndrome  The various methods of treatment have been discussed with the patient and family. After consideration of risks, benefits and other options for treatment, the patient has consented to  Procedure(s): CARPAL TUNNEL RELEASE (Right) as a surgical intervention .  The patient's history has been reviewed, patient examined, no change in status, stable for surgery.  I have reviewed the patient's chart and labs.  Questions were answered to the patient's satisfaction.     Arther Abbott

## 2016-07-16 NOTE — Brief Op Note (Signed)
07/16/2016  7:58 AM  PATIENT:  Summer Cook  50 y.o. female  PRE-OPERATIVE DIAGNOSIS:  right carpal tunnel syndrome  POST-OPERATIVE DIAGNOSIS:  right carpal tunnel syndrome  PROCEDURE:  Procedure(s): RIGHT CARPAL TUNNEL RELEASE (Right)  Carpal tunnel release right wrist  Preop diagnosis carpal tunnel syndrome right wrist postop diagnosis same  Procedure open carpal tunnel release right wrist  Surgeon Aline Brochure  Anesthesia regional Bier block  Operative findings compression of the right median nerveoccupied lesions  Indications failure of conservative treatment to relieve pain and paresthesias and numbness and tingling of the right hand.  The patient was identified in the preop area we confirm the surgical site marked as right wrist. Chart update completed. Patient taken to surgery. She had 2 g of Ancef. After establishing a Bier block her arm was prepped with ChloraPrep.  Timeout executed completed and confirmed site.  A straight incision was made over the carpal tunnel in line with the radial border of the ring finger. Blunt dissection was carried out to find the distal aspect of the carpal tunnel. A blunted judgment was passed beneath the carpal tunnel. Sharp incision was then used to release the transverse carpal ligament. The contents of the carpal tunnel were inspected. The median nerve was compressed with slight discoloration.  The wound was irrigated and then closed with 3-0 nylon suture. We injected 10 mL of plain Marcaine on the radial side of the incision  A sterile bandage was applied and the tourniquet was released the color of the hand and capillary refill were normal  The patient was taken to the recovery room in stable condition SURGEON:  Surgeon(s) and Role:    * Carole Civil, MD - Primary  PHYSICIAN ASSISTANT:   ASSISTANTS: none   ANESTHESIA:   regional  EBL:  No intake/output data recorded.  BLOOD ADMINISTERED:none  DRAINS: none   LOCAL  MEDICATIONS USED:  MARCAINE     SPECIMEN:  No Specimen  DISPOSITION OF SPECIMEN:  N/A  COUNTS:  YES  TOURNIQUET:  * Missing tourniquet times found for documented tourniquets in log:  [id number] *  DICTATION: .Dragon Dictation  PLAN OF CARE: Discharge to home after PACU  PATIENT DISPOSITION:  PACU - hemodynamically stable.   Delay start of Pharmacological VTE agent (>24hrs) due to surgical blood loss or risk of bleeding: not applicable  [id number]

## 2016-07-16 NOTE — Transfer of Care (Signed)
Immediate Anesthesia Transfer of Care Note  Patient: Summer Cook  Procedure(s) Performed: Procedure(s): RIGHT CARPAL TUNNEL RELEASE (Right)  Patient Location: PACU  Anesthesia Type:MAC combined with regional for post-op pain  Level of Consciousness: awake, alert , oriented and patient cooperative  Airway & Oxygen Therapy: Patient Spontanous Breathing and Patient connected to nasal cannula oxygen  Post-op Assessment: Report given to RN and Post -op Vital signs reviewed and stable  Post vital signs: Reviewed and stable  Last Vitals:  Vitals:   07/16/16 0715 07/16/16 0720  BP:  91/69  Pulse:    Resp: 14 (!) 23  Temp:      Last Pain:  Vitals:   07/16/16 0706  TempSrc: Oral      Patients Stated Pain Goal: 4 (16/38/46 6599)  Complications: No apparent anesthesia complications

## 2016-07-17 ENCOUNTER — Telehealth: Payer: Self-pay | Admitting: Orthopedic Surgery

## 2016-07-17 ENCOUNTER — Other Ambulatory Visit: Payer: Self-pay | Admitting: Orthopedic Surgery

## 2016-07-17 MED ORDER — ONDANSETRON HCL 4 MG PO TABS: 4.0000 mg | ORAL_TABLET | Freq: Three times a day (TID) | ORAL | 0 refills | Status: DC | PRN

## 2016-07-17 NOTE — Telephone Encounter (Signed)
ROUTING TO DR HARRISON 

## 2016-07-17 NOTE — Telephone Encounter (Signed)
Patient's daughter called to say that the patient is in a lot of pain and that the Hydrocodone-Acetaminophen 10/325mg  is not helping. She said that Dr. Aline Brochure told her mom to call if it didn't help. She is asking if he can do another pain medicine.  Please advise

## 2016-07-17 NOTE — Telephone Encounter (Signed)
No  Take 2 at a time

## 2016-07-20 ENCOUNTER — Ambulatory Visit (INDEPENDENT_AMBULATORY_CARE_PROVIDER_SITE_OTHER): Payer: Medicare HMO | Admitting: Nurse Practitioner

## 2016-07-20 ENCOUNTER — Encounter: Payer: Self-pay | Admitting: Nurse Practitioner

## 2016-07-20 VITALS — BP 138/86 | HR 98 | Temp 97.5°F | Ht 65.0 in | Wt 190.0 lb

## 2016-07-20 DIAGNOSIS — F411 Generalized anxiety disorder: Secondary | ICD-10-CM

## 2016-07-20 DIAGNOSIS — F331 Major depressive disorder, recurrent, moderate: Secondary | ICD-10-CM | POA: Diagnosis not present

## 2016-07-20 MED ORDER — ESCITALOPRAM OXALATE 20 MG PO TABS: 20.0000 mg | ORAL_TABLET | Freq: Every day | ORAL | 3 refills | Status: DC

## 2016-07-20 MED ORDER — ALPRAZOLAM 0.25 MG PO TABS: 0.2500 mg | ORAL_TABLET | Freq: Three times a day (TID) | ORAL | 0 refills | Status: DC | PRN

## 2016-07-20 NOTE — Patient Instructions (Addendum)
patient was informed that xanax prescription is temporary, hence no refills will be given. I instructed pt to start 1/2 tablet once daily for 1 week and then increase to a full tablet once daily on week two as tolerated.   We discussed common side effects such as nausea, drowsiness and weight gain.  Also discussed rare but serious side effect of suicide ideation.  She is instructed to discontinue medication go directly to ED if this occurs.  Pt verbalizes understanding.   Plan follow up in 2weeks to evaluate progress.

## 2016-07-20 NOTE — Assessment & Plan Note (Signed)
Zoloft stopped due to diarrhea. Which has since improved. Started on lexapro prescribed today. Given transitional prescription of xanax today.  Patient was informed that xanax prescription is temporarym hence will not be refilled

## 2016-07-20 NOTE — Progress Notes (Signed)
Pre visit review using our clinic review tool, if applicable. No additional management support is needed unless otherwise documented below in the visit note. 

## 2016-07-20 NOTE — Progress Notes (Signed)
Subjective:  Patient ID: Summer Cook, female    DOB: [birth year]  Age: 50 y.o. MRN: [id number]  CC: Depression (Pt stated stated need antidepression medication Rx. Zoloft and Rx. Xanax)   Depression       The patient presents with depression.  This is a chronic problem.  The problem occurs constantly.  The problem has been gradually worsening since onset.  Associated symptoms include decreased concentration, fatigue, restlessness, myalgias and sad.  Associated symptoms include no helplessness, no hopelessness, does not have insomnia, not irritable, no decreased interest, no appetite change, no body aches, no headaches, no indigestion and no suicidal ideas.( Easily crying and constantly feeling anxious.)     The symptoms are aggravated by family issues.  Past treatments include SSRIs - Selective serotonin reuptake inhibitors and other medications.  Compliance with treatment is variable.  Past compliance problems include medication issues.  Risk factors include a change in medications, stress, family history, family history of mental illness and history of mental illness.   Past medical history includes chronic illness, anxiety and depression.   she weaned herself off zoloft 82month ago per gastroenterologist instructions due to diarrhea. Diarhea has resolved since stopping zoloft 164monthgo. Has used cymbalta and brintellix in past. Was not able to tolerate adverse effects. Also had psychology counselling for [id number] was very helpful. She is willing to resume. Denies any ETOH or ellicit drug use.  Outpatient Medications Prior to Visit  Medication Sig Dispense Refill  . aspirin EC 81 MG tablet Take 1 tablet (81 mg total) by mouth daily. 30 tablet 6  . furosemide (LASIX) 20 MG tablet Take 20-40 mg by mouth 2 (two) times daily as needed for fluid. Reported on 01/15/2016  2  . HYDROcodone-acetaminophen (NORCO) 10-325 MG tablet Take 1 tablet by mouth every 4 (four) hours as needed. 30 tablet 0  .  ipratropium-albuterol (DUONEB) 0.5-2.5 (3) MG/3ML SOLN Take 3 mLs by nebulization 3 (three) times daily as needed (shorntess of breath/wheezing). (Patient taking differently: Take 3 mLs by nebulization 3 (three) times daily as needed (shortness of breath/wheezing). ) 360 mL 0  . KLOR-CON 10 10 MEQ tablet Take 10 mEq by mouth as needed (Only takes with the furosemide). Reported on 01/15/2016  2  . levothyroxine (SYNTHROID, LEVOTHROID) 50 MCG tablet Take 1 tablet (50 mcg total) by mouth daily. 90 tablet 3  . ondansetron (ZOFRAN) 4 MG tablet Take 1 tablet (4 mg total) by mouth every 8 (eight) hours as needed for nausea or vomiting. 20 tablet 0  . polyvinyl alcohol (LIQUIFILM TEARS) 1.4 % ophthalmic solution Place 1 drop into both eyes 3 (three) times daily as needed for dry eyes.     . PMarland KitchenOAIR RESPICLICK 1080340 BASE) MCG/ACT AEPB Inhale 2 puffs into the lungs every 4 (four) hours as needed (Shortness of breath).   3  . tiZANidine (ZANAFLEX) 4 MG tablet TAKE 1 TABLET BY MOUTH EVERY 6 HOURS AS NEEDED FOR MUSCLE SPASMS 385 tablet 0  . triamcinolone cream (KENALOG) 0.1 % Apply 1 application topically 2 (two) times daily. 30 g 0  . vitamin B-12 (CYANOCOBALAMIN) 1000 MCG tablet Take 1,500 mcg by mouth daily.     No facility-administered medications prior to visit.     ROS See HPI  Objective:  BP 138/86 (BP Location: Left Arm, Patient Position: Sitting, Cuff Size: Large)   Pulse 98   Temp 97.5 F (36.4 C)   Ht '5\' 5"'$  (1.651 m)   Wt 190  lb (86.2 kg)   LMP 05/13/2011   SpO2 95%   BMI 31.62 kg/m   BP Readings from Last 3 Encounters:  07/20/16 138/86  07/16/16 (!) 132/97  07/13/16 (!) 141/92    Wt Readings from Last 3 Encounters:  07/20/16 190 lb (86.2 kg)  07/16/16 189 lb (85.7 kg)  07/13/16 189 lb (85.7 kg)    Physical Exam  Constitutional: She is oriented to person, place, and time. She is not irritable. No distress.  Eyes: No scleral icterus.  Neck: Normal range of motion. Neck supple.   Cardiovascular: Normal rate and normal heart sounds.   Pulmonary/Chest: Effort normal and breath sounds normal.  Musculoskeletal: She exhibits no edema.  Neurological: She is alert and oriented to person, place, and time.  Skin: Skin is warm and dry.  Psychiatric: She has a normal mood and affect. Her behavior is normal.  Vitals reviewed.   Lab Results  Component Value Date   WBC 8.2 07/13/2016   HGB 14.3 07/13/2016   HCT 44.2 07/13/2016   PLT 165 07/13/2016   GLUCOSE 93 07/13/2016   CHOL 186 12/17/2015   TRIG 132 12/17/2015   HDL 25 (L) 12/17/2015   LDLCALC 135 (H) 12/17/2015   ALT 20 06/17/2016   AST 31 06/17/2016   NA 138 07/13/2016   K 4.4 07/13/2016   CL 107 07/13/2016   CREATININE 1.53 (H) 07/13/2016   BUN 24 (H) 07/13/2016   CO2 25 07/13/2016   TSH 0.957 06/17/2016   INR 1.06 06/17/2016   HGBA1C 6.2 (H) 12/17/2015    No results found.  Assessment & Plan:   Summer Cook was seen today for depression.  Diagnoses and all orders for this visit:  Moderate episode of recurrent major depressive disorder (HCC) -     escitalopram (LEXAPRO) 20 MG tablet; Take 1 tablet (20 mg total) by mouth daily. -     ALPRAZolam (XANAX) 0.25 MG tablet; Take 1 tablet (0.25 mg total) by mouth 3 (three) times daily as needed for anxiety. -     Ambulatory referral to Psychology  Anxiety state -     escitalopram (LEXAPRO) 20 MG tablet; Take 1 tablet (20 mg total) by mouth daily. -     ALPRAZolam (XANAX) 0.25 MG tablet; Take 1 tablet (0.25 mg total) by mouth 3 (three) times daily as needed for anxiety. -     Ambulatory referral to Psychology   I have discontinued Summer Cook's oxyCODONE-acetaminophen. I am also having her start on escitalopram and ALPRAZolam. Additionally, I am having her maintain her furosemide, PROAIR RESPICLICK, KLOR-CON 10, ipratropium-albuterol, levothyroxine, aspirin EC, tiZANidine, polyvinyl alcohol, vitamin B-12, triamcinolone cream, HYDROcodone-acetaminophen,  ondansetron, sertraline, predniSONE, and metoprolol succinate.  Meds ordered this encounter  Medications  . DISCONTD: oxyCODONE-acetaminophen (PERCOCET/ROXICET) 5-325 MG tablet    Sig: TK 1 T PO Q 4 H PRF SEVERE PAIN    Refill:  0  . sertraline (ZOLOFT) 50 MG tablet  . predniSONE (DELTASONE) 10 MG tablet  . metoprolol succinate (TOPROL-XL) 50 MG 24 hr tablet  . escitalopram (LEXAPRO) 20 MG tablet    Sig: Take 1 tablet (20 mg total) by mouth daily.    Dispense:  30 tablet    Refill:  3    Order Specific Question:   Supervising Provider    Answer:   Cassandria Anger [[id number]]  . ALPRAZolam (XANAX) 0.25 MG tablet    Sig: Take 1 tablet (0.25 mg total) by mouth 3 (three) times daily as needed  for anxiety.    Dispense:  30 tablet    Refill:  0    Order Specific Question:   Supervising Provider    Answer:   Cassandria Anger [[id number]]   Recent Results (from the past 2160 hour(s))  Rapid strep screen     Status: None   Collection Time: 04/29/16  3:50 PM  Result Value Ref Range   Streptococcus, Group A Screen (Direct) NEGATIVE NEGATIVE    Comment: (NOTE) A Rapid Antigen test may result negative if the antigen level in the sample is below the detection level of this test. The FDA has not cleared this test as a stand-alone test therefore the rapid antigen negative result has reflexed to a Group A Strep culture.   Culture, group A strep     Status: None   Collection Time: 04/29/16  3:50 PM  Result Value Ref Range   Specimen Description THROAT    Special Requests NONE Reflexed from [id number]    Culture      NO GROUP A STREP (S.PYOGENES) ISOLATED Performed at [facility]    Report Status 05/02/2016 FINAL   CBC with Differential/Platelet     Status: Abnormal   Collection Time: 04/29/16  3:56 PM  Result Value Ref Range   WBC 6.7 4.0 - 10.5 K/uL   RBC 5.57 (H) 3.87 - 5.11 MIL/uL   Hemoglobin 14.6 12.0 - 15.0 g/dL   HCT 46.0 36.0 - 46.0 %   MCV 82.6 78.0 - 100.0 fL   MCH 26.2  26.0 - 34.0 pg   MCHC 31.7 30.0 - 36.0 g/dL   RDW 18.1 (H) 11.5 - 15.5 %   Platelets 167 150 - 400 K/uL    Comment: SPECIMEN CHECKED FOR CLOTS PLATELET COUNT CONFIRMED BY SMEAR LARGE PLATELETS PRESENT    Neutrophils Relative % 40 %   Neutro Abs 2.7 1.7 - 7.7 K/uL   Lymphocytes Relative 44 %   Lymphs Abs 2.9 0.7 - 4.0 K/uL   Monocytes Relative 11 %   Monocytes Absolute 0.8 0.1 - 1.0 K/uL   Eosinophils Relative 3 %   Eosinophils Absolute 0.2 0.0 - 0.7 K/uL   Basophils Relative 1 %   Basophils Absolute 0.1 0.0 - 0.1 K/uL  Comprehensive metabolic panel     Status: Abnormal   Collection Time: 04/29/16  3:56 PM  Result Value Ref Range   Sodium 131 (L) 135 - 145 mmol/L   Potassium 4.2 3.5 - 5.1 mmol/L   Chloride 104 101 - 111 mmol/L   CO2 18 (L) 22 - 32 mmol/L   Glucose, Bld 74 65 - 99 mg/dL   BUN 22 (H) 6 - 20 mg/dL   Creatinine, Ser 1.85 (H) 0.44 - 1.00 mg/dL   Calcium 9.9 8.9 - 10.3 mg/dL   Total Protein 7.0 6.5 - 8.1 g/dL   Albumin 3.6 3.5 - 5.0 g/dL   AST 36 15 - 41 U/L   ALT 22 14 - 54 U/L   Alkaline Phosphatase 102 38 - 126 U/L   Total Bilirubin 0.9 0.3 - 1.2 mg/dL   GFR calc non Af Amer 31 (L) >60 mL/min   GFR calc Af Amer 36 (L) >60 mL/min    Comment: (NOTE) The eGFR has been calculated using the CKD EPI equation. This calculation has not been validated in all clinical situations. eGFR's persistently <60 mL/min signify possible Chronic Kidney Disease.    Anion gap 9 5 - 15  CBC  Status: Abnormal   Collection Time: 05/18/16  1:00 PM  Result Value Ref Range   WBC 8.0 4.0 - 10.5 K/uL   RBC 5.84 (H) 3.87 - 5.11 MIL/uL   Hemoglobin 15.9 (H) 12.0 - 15.0 g/dL   HCT 48.7 (H) 36.0 - 46.0 %   MCV 83.4 78.0 - 100.0 fL   MCH 27.2 26.0 - 34.0 pg   MCHC 32.6 30.0 - 36.0 g/dL   RDW 17.6 (H) 11.5 - 15.5 %   Platelets 116 (L) 150 - 400 K/uL    Comment: SPECIMEN CHECKED FOR CLOTS PLATELET COUNT CONFIRMED BY SMEAR   Basic metabolic panel     Status: Abnormal   Collection  Time: 05/18/16  1:00 PM  Result Value Ref Range   Sodium 134 (L) 135 - 145 mmol/L   Potassium 3.8 3.5 - 5.1 mmol/L   Chloride 105 101 - 111 mmol/L   CO2 19 (L) 22 - 32 mmol/L   Glucose, Bld 102 (H) 65 - 99 mg/dL   BUN 24 (H) 6 - 20 mg/dL   Creatinine, Ser 1.82 (H) 0.44 - 1.00 mg/dL   Calcium 9.3 8.9 - 10.3 mg/dL   GFR calc non Af Amer 31 (L) >60 mL/min   GFR calc Af Amer 36 (L) >60 mL/min    Comment: (NOTE) The eGFR has been calculated using the CKD EPI equation. This calculation has not been validated in all clinical situations. eGFR's persistently <60 mL/min signify possible Chronic Kidney Disease.    Anion gap 10 5 - 15  CBC     Status: Abnormal   Collection Time: 05/22/16  1:25 PM  Result Value Ref Range   WBC 6.9 4.0 - 10.5 K/uL   RBC 5.65 (H) 3.87 - 5.11 MIL/uL   Hemoglobin 15.6 (H) 12.0 - 15.0 g/dL   HCT 47.7 (H) 36.0 - 46.0 %   MCV 84.4 78.0 - 100.0 fL   MCH 27.6 26.0 - 34.0 pg   MCHC 32.7 30.0 - 36.0 g/dL   RDW 17.6 (H) 11.5 - 15.5 %   Platelets 144 (L) 150 - 400 K/uL    Comment: SPECIMEN CHECKED FOR CLOTS LARGE PLATELETS PRESENT PLATELET COUNT CONFIRMED BY SMEAR   CBC WITH DIFFERENTIAL     Status: Abnormal   Collection Time: 06/17/16 10:47 AM  Result Value Ref Range   WBC 7.3 4.0 - 10.5 K/uL   RBC 5.24 (H) 3.87 - 5.11 MIL/uL   Hemoglobin 14.4 12.0 - 15.0 g/dL   HCT 44.4 36.0 - 46.0 %   MCV 84.7 78.0 - 100.0 fL   MCH 27.5 26.0 - 34.0 pg   MCHC 32.4 30.0 - 36.0 g/dL   RDW 16.6 (H) 11.5 - 15.5 %   Platelets 193 150 - 400 K/uL   Neutrophils Relative % 36 %   Neutro Abs 2.6 1.7 - 7.7 K/uL   Lymphocytes Relative 50 %   Lymphs Abs 3.7 0.7 - 4.0 K/uL   Monocytes Relative 11 %   Monocytes Absolute 0.8 0.1 - 1.0 K/uL   Eosinophils Relative 3 %   Eosinophils Absolute 0.2 0.0 - 0.7 K/uL   Basophils Relative 1 %   Basophils Absolute 0.0 0.0 - 0.1 K/uL  Comprehensive metabolic panel     Status: Abnormal   Collection Time: 06/17/16 10:47 AM  Result Value Ref  Range   Sodium 137 135 - 145 mmol/L   Potassium 4.3 3.5 - 5.1 mmol/L   Chloride 106 101 - 111 mmol/L  CO2 24 22 - 32 mmol/L   Glucose, Bld 89 65 - 99 mg/dL   BUN 40 (H) 6 - 20 mg/dL   Creatinine, Ser 2.00 (H) 0.44 - 1.00 mg/dL   Calcium 8.8 (L) 8.9 - 10.3 mg/dL   Total Protein 7.0 6.5 - 8.1 g/dL   Albumin 3.7 3.5 - 5.0 g/dL   AST 31 15 - 41 U/L   ALT 20 14 - 54 U/L   Alkaline Phosphatase 138 (H) 38 - 126 U/L   Total Bilirubin 1.0 0.3 - 1.2 mg/dL   GFR calc non Af Amer 28 (L) >60 mL/min   GFR calc Af Amer 32 (L) >60 mL/min    Comment: (NOTE) The eGFR has been calculated using the CKD EPI equation. This calculation has not been validated in all clinical situations. eGFR's persistently <60 mL/min signify possible Chronic Kidney Disease.    Anion gap 7 5 - 15  Protime-INR     Status: None   Collection Time: 06/17/16 10:47 AM  Result Value Ref Range   Prothrombin Time 13.8 11.4 - 15.2 seconds   INR 1.06   Urinalysis, Routine w reflex microscopic (not at [facility])     Status: Abnormal   Collection Time: 06/17/16 10:47 AM  Result Value Ref Range   Color, Urine YELLOW YELLOW   APPearance CLOUDY (A) CLEAR   Specific Gravity, Urine 1.020 1.005 - 1.030   pH 5.0 5.0 - 8.0   Glucose, UA NEGATIVE NEGATIVE mg/dL   Hgb urine dipstick NEGATIVE NEGATIVE   Bilirubin Urine NEGATIVE NEGATIVE   Ketones, ur NEGATIVE NEGATIVE mg/dL   Protein, ur NEGATIVE NEGATIVE mg/dL   Nitrite NEGATIVE NEGATIVE   Leukocytes, UA SMALL (A) NEGATIVE  D-dimer, quantitative (not at [facility])     Status: Abnormal   Collection Time: 06/17/16 10:47 AM  Result Value Ref Range   D-Dimer, Quant 0.87 (H) 0.00 - 0.50 ug/mL-FEU    Comment: (NOTE) At the manufacturer cut-off of 0.50 ug/mL FEU, this assay has been documented to exclude PE with a sensitivity and negative predictive value of 97 to 99%.  At this time, this assay has not been approved by the FDA to exclude DVT/VTE. Results should be correlated with clinical  presentation.   Urine microscopic-add on     Status: Abnormal   Collection Time: 06/17/16 10:47 AM  Result Value Ref Range   Squamous Epithelial / LPF 6-30 (A) NONE SEEN   WBC, UA 6-30 0 - 5 WBC/hpf   RBC / HPF 0-5 0 - 5 RBC/hpf   Bacteria, UA RARE (A) NONE SEEN  CBG monitoring, ED     Status: None   Collection Time: 06/17/16 10:57 AM  Result Value Ref Range   Glucose-Capillary 90 65 - 99 mg/dL  I-stat troponin, ED  (not at [facility])     Status: None   Collection Time: 06/17/16 11:04 AM  Result Value Ref Range   Troponin i, poc 0.00 0.00 - 0.08 ng/mL   Comment 3            Comment: Due to the release kinetics of cTnI, a negative result within the first hours of the onset of symptoms does not rule out myocardial infarction with certainty. If myocardial infarction is still suspected, repeat the test at appropriate intervals.   TSH     Status: None   Collection Time: 06/17/16 11:43 AM  Result Value Ref Range   TSH 0.957 0.350 - 4.500 uIU/mL  I-stat troponin, ED  (  not at [facility])     Status: None   Collection Time: 06/17/16  3:42 PM  Result Value Ref Range   Troponin i, poc 0.00 0.00 - 0.08 ng/mL   Comment 3            Comment: Due to the release kinetics of cTnI, a negative result within the first hours of the onset of symptoms does not rule out myocardial infarction with certainty. If myocardial infarction is still suspected, repeat the test at appropriate intervals.   CBC     Status: Abnormal   Collection Time: 07/03/16  9:50 AM  Result Value Ref Range   WBC 6.8 4.0 - 10.5 K/uL   RBC 5.47 (H) 3.87 - 5.11 MIL/uL   Hemoglobin 15.4 (H) 12.0 - 15.0 g/dL   HCT 46.9 (H) 36.0 - 46.0 %   MCV 85.7 78.0 - 100.0 fL   MCH 28.2 26.0 - 34.0 pg   MCHC 32.8 30.0 - 36.0 g/dL   RDW 16.3 (H) 11.5 - 15.5 %   Platelets 119 (L) 150 - 400 K/uL    Comment: SPECIMEN CHECKED FOR CLOTS PLATELET COUNT CONFIRMED BY SMEAR LARGE PLATELETS PRESENT   CBC with Differential/Platelet      Status: Abnormal   Collection Time: 07/13/16  9:04 AM  Result Value Ref Range   WBC 8.2 4.0 - 10.5 K/uL   RBC 5.14 (H) 3.87 - 5.11 MIL/uL   Hemoglobin 14.3 12.0 - 15.0 g/dL   HCT 44.2 36.0 - 46.0 %   MCV 86.0 78.0 - 100.0 fL   MCH 27.8 26.0 - 34.0 pg   MCHC 32.4 30.0 - 36.0 g/dL   RDW 15.6 (H) 11.5 - 15.5 %   Platelets 165 150 - 400 K/uL    Comment: SPECIMEN CHECKED FOR CLOTS LARGE PLATELETS PRESENT    Neutrophils Relative % 43 %   Neutro Abs 3.5 1.7 - 7.7 K/uL   Lymphocytes Relative 43 %   Lymphs Abs 3.6 0.7 - 4.0 K/uL   Monocytes Relative 11 %   Monocytes Absolute 0.9 0.1 - 1.0 K/uL   Eosinophils Relative 2 %   Eosinophils Absolute 0.2 0.0 - 0.7 K/uL   Basophils Relative 1 %   Basophils Absolute 0.1 0.0 - 0.1 K/uL  Basic metabolic panel     Status: Abnormal   Collection Time: 07/13/16  9:04 AM  Result Value Ref Range   Sodium 138 135 - 145 mmol/L   Potassium 4.4 3.5 - 5.1 mmol/L   Chloride 107 101 - 111 mmol/L   CO2 25 22 - 32 mmol/L   Glucose, Bld 93 65 - 99 mg/dL   BUN 24 (H) 6 - 20 mg/dL   Creatinine, Ser 1.53 (H) 0.44 - 1.00 mg/dL   Calcium 8.9 8.9 - 10.3 mg/dL   GFR calc non Af Amer 39 (L) >60 mL/min   GFR calc Af Amer 45 (L) >60 mL/min    Comment: (NOTE) The eGFR has been calculated using the CKD EPI equation. This calculation has not been validated in all clinical situations. eGFR's persistently <60 mL/min signify possible Chronic Kidney Disease.    Anion gap 6 5 - 15    Follow-up: Return in about 2 weeks (around 08/03/2016) for depression and anxiety with dr. Sharlet Salina.  Wilfred Lacy, NP

## 2016-07-20 NOTE — Telephone Encounter (Signed)
STATES SHE CANNOT TAKE THE PAIN MEDICATION, STILL HAVING NAUSEA WITH THE PAIN MEDICATION, EVEN WHEN TAKING NAUSEA MEDICATION  WILL ADDRESS AT TIME OF OFFICE VISIT TOMORROW

## 2016-07-21 ENCOUNTER — Ambulatory Visit (INDEPENDENT_AMBULATORY_CARE_PROVIDER_SITE_OTHER): Payer: Medicare HMO | Admitting: Orthopedic Surgery

## 2016-07-21 ENCOUNTER — Encounter: Payer: Self-pay | Admitting: Orthopedic Surgery

## 2016-07-21 DIAGNOSIS — Z9889 Other specified postprocedural states: Secondary | ICD-10-CM

## 2016-07-21 DIAGNOSIS — Z4789 Encounter for other orthopedic aftercare: Secondary | ICD-10-CM

## 2016-07-21 MED ORDER — HYDROCODONE-ACETAMINOPHEN 10-325 MG PO TABS: 1.0000 | ORAL_TABLET | ORAL | 0 refills | Status: DC | PRN

## 2016-07-21 MED ORDER — PROMETHAZINE HCL 12.5 MG PO TABS: 12.5000 mg | ORAL_TABLET | Freq: Four times a day (QID) | ORAL | 0 refills | Status: DC | PRN

## 2016-07-21 NOTE — Progress Notes (Signed)
Patient ID: Summer Cook, female   DOB: [birth year], 50 y.o.   MRN: [id number]  Post op visit   Chief Complaint  Patient presents with  . Follow-up    post op 1, RT CTR, DOS 07/16/16    LMP 05/13/2011   C/O NAUSEA VOMITING, PAIN  CANT TAKE PAIN MEDS   Incision clean dry intact no erythema mild ecchymosis.  Switch to Phenergan 12.5 and non-generic Norco 10 follow-up November 2 nurse visit take sutures out

## 2016-07-24 ENCOUNTER — Ambulatory Visit: Payer: Medicare HMO | Admitting: Neurology

## 2016-07-24 IMAGING — CR DG CHEST 1V PORT
1 series · 1 of 1 positions shown · non-contrast
Comparison: Prior chest x-ray 01/19/2015

CLINICAL DATA: 49-year-old female with shortness of breath, cough,
fever and wheezing

EXAM:
PORTABLE CHEST - 1 VIEW

[ap portable]
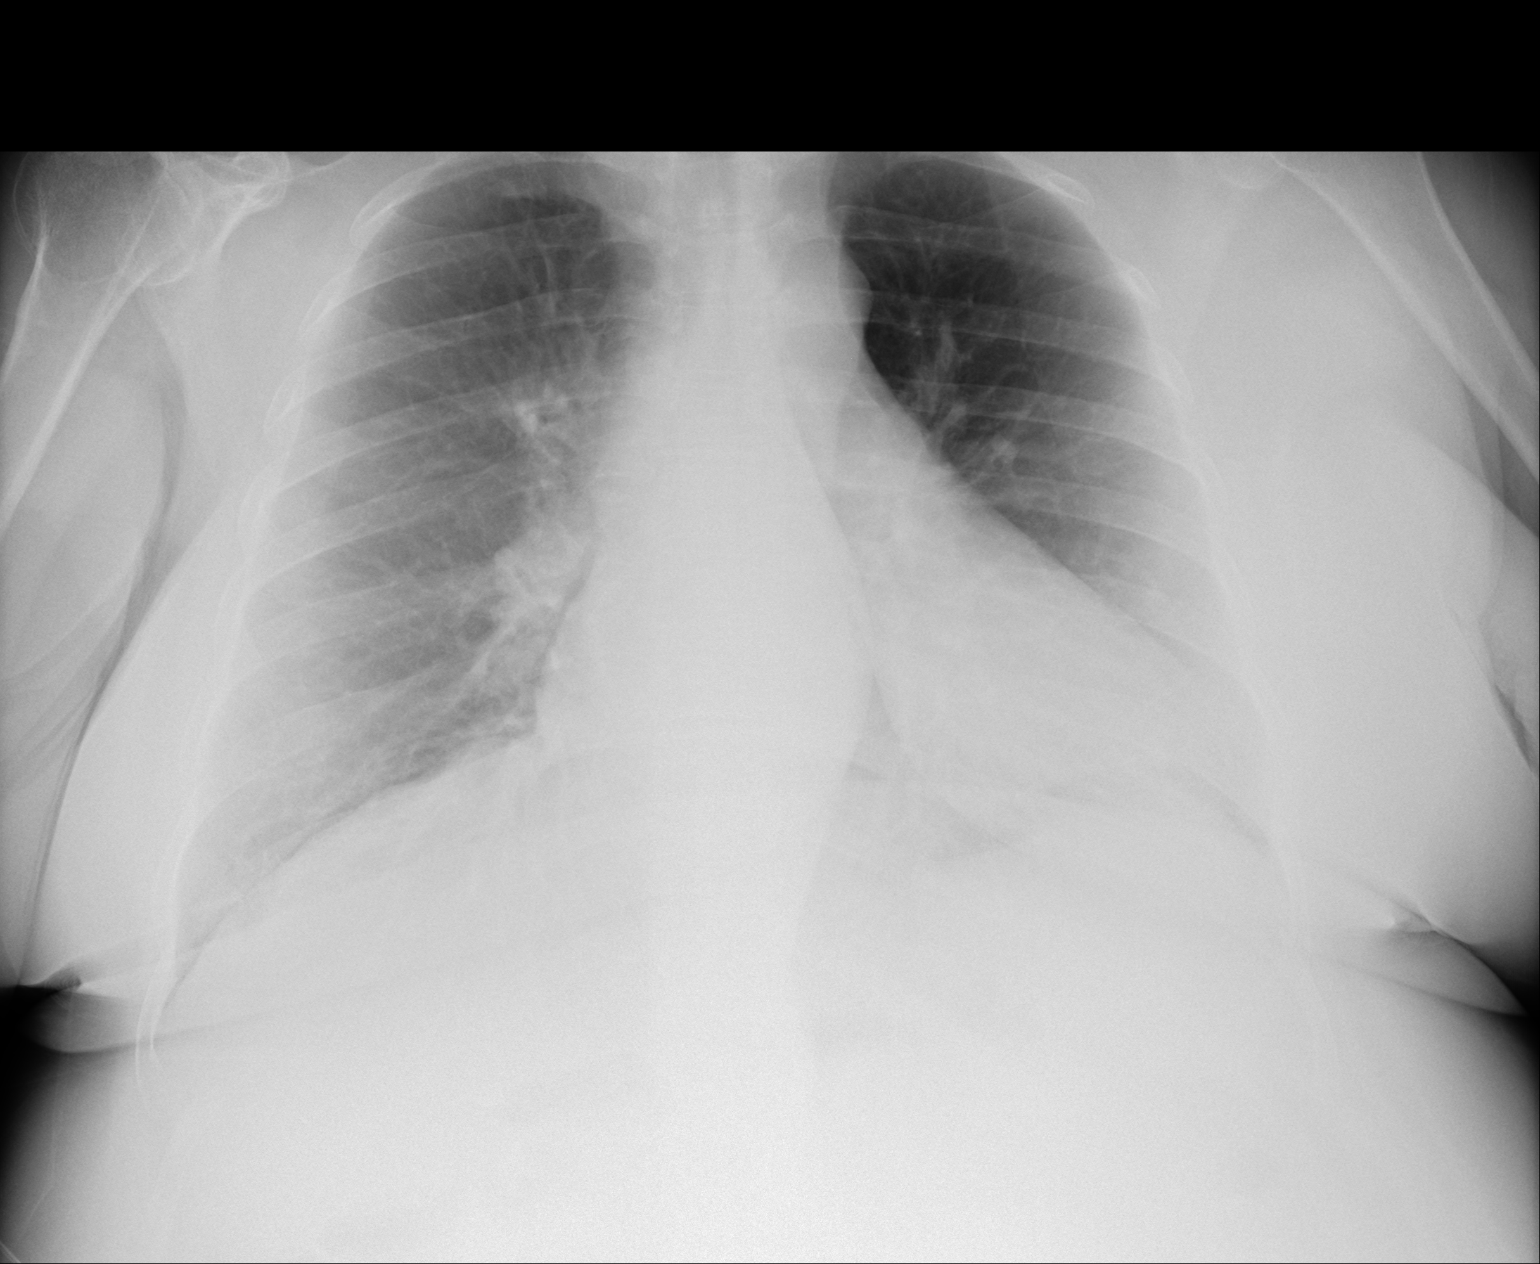

[1 of 1 positions shown; findings below may reference images not displayed]

FINDINGS: Low inspiratory volumes. Otherwise, no focal airspace consolidation,
pleural effusion or pneumothorax. No evidence of pulmonary edema.
Stable cardiac and mediastinal contours. Osseous structures intact
and unremarkable for age.
IMPRESSION: No acute cardiopulmonary process.

## 2016-07-25 IMAGING — US US RENAL
1 series · 14 of 25 positions shown · non-contrast
Comparison: CT abdomen 01/19/2015

CLINICAL DATA: Acute renal failure

EXAM:
RENAL / URINARY TRACT ULTRASOUND COMPLETE

[Series 1: us renal · 0.26mm/px · 14 of 33 slices shown]
[im 1/33]
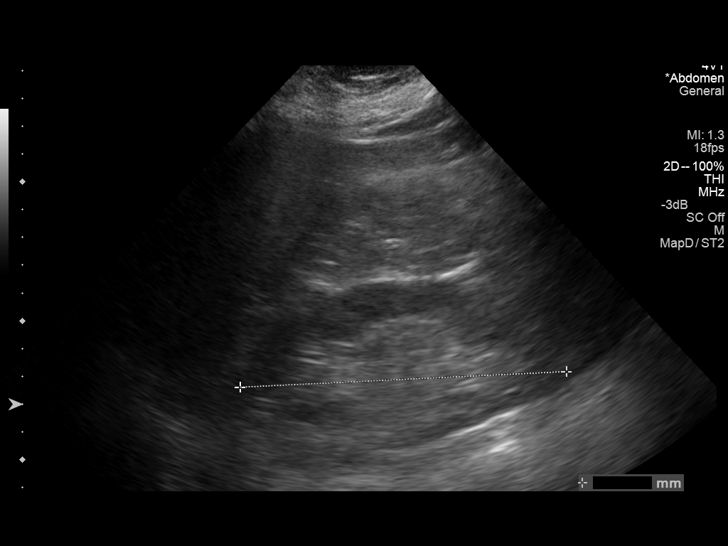
[im 3/33]
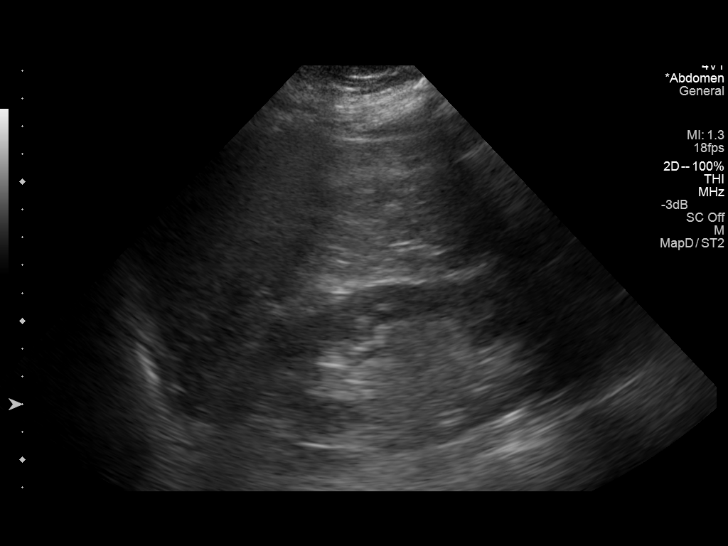
[im 6/33]
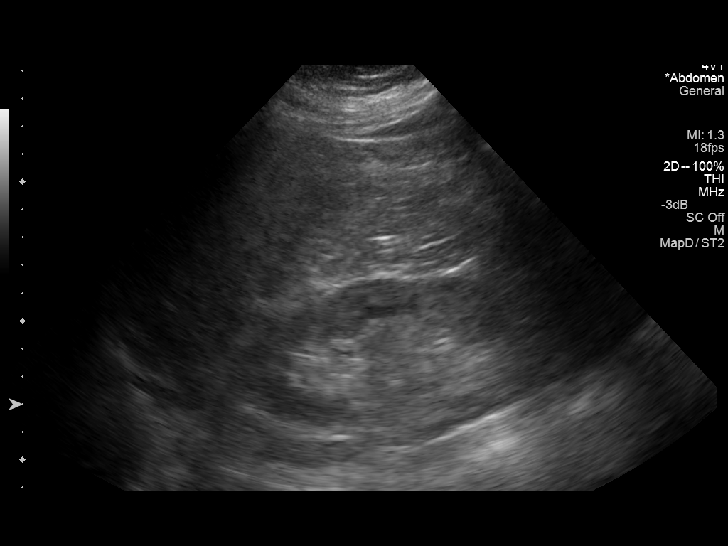
[im 9/33]
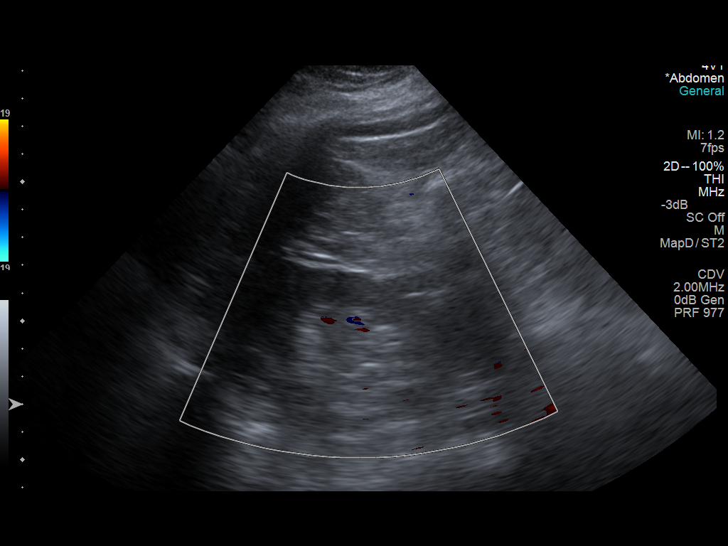
[im 11/33]
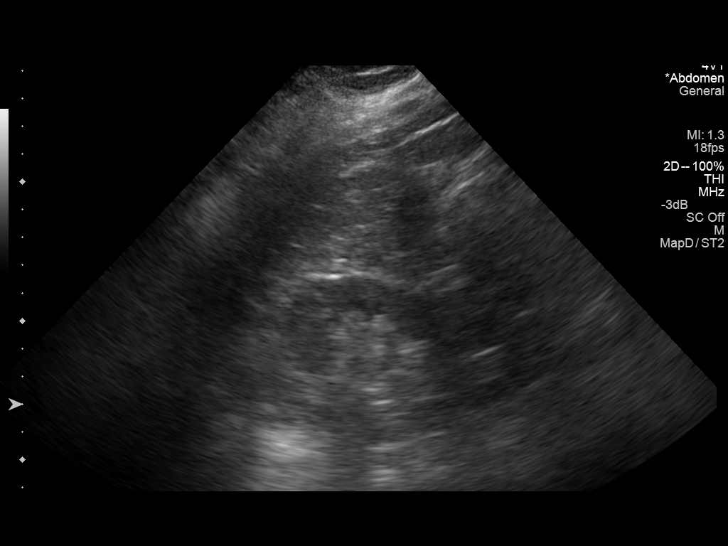
[im 13/33]
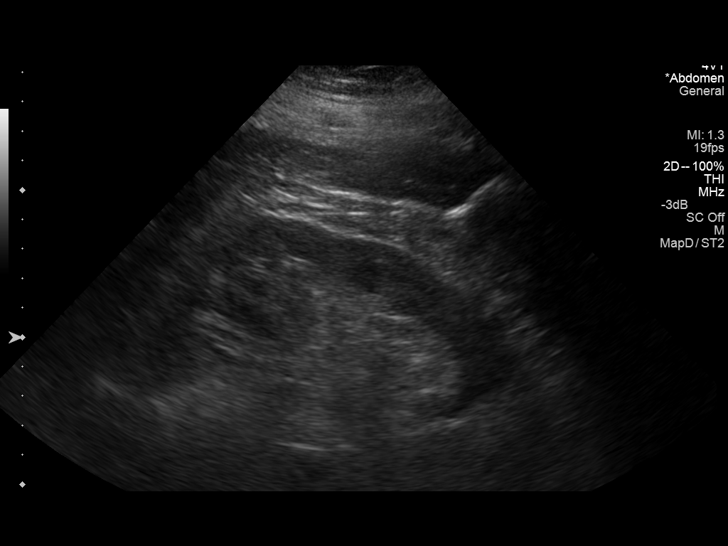
[im 15/33]
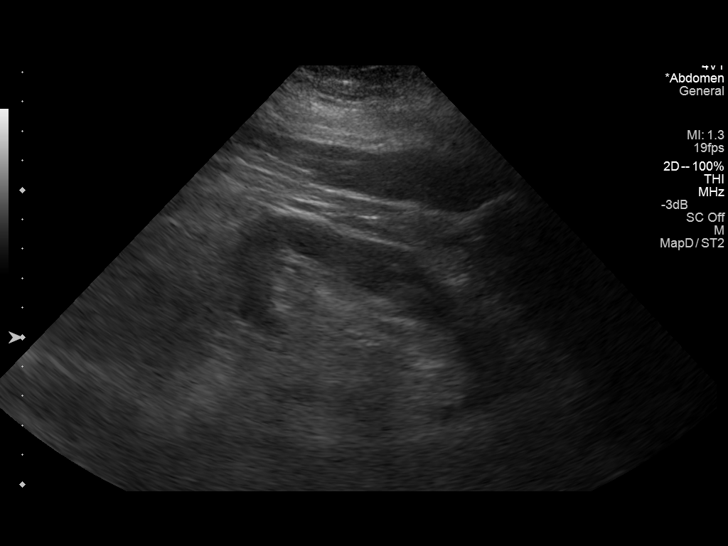
[im 18/33]
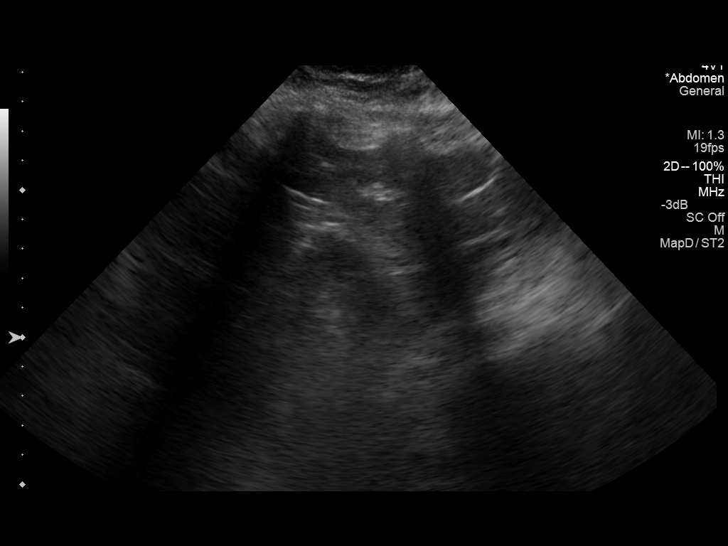
[im 21/33]
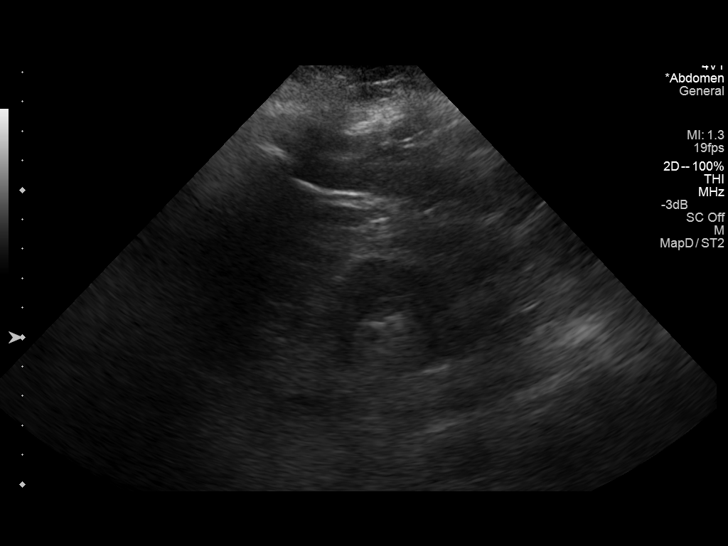
[im 22/33]
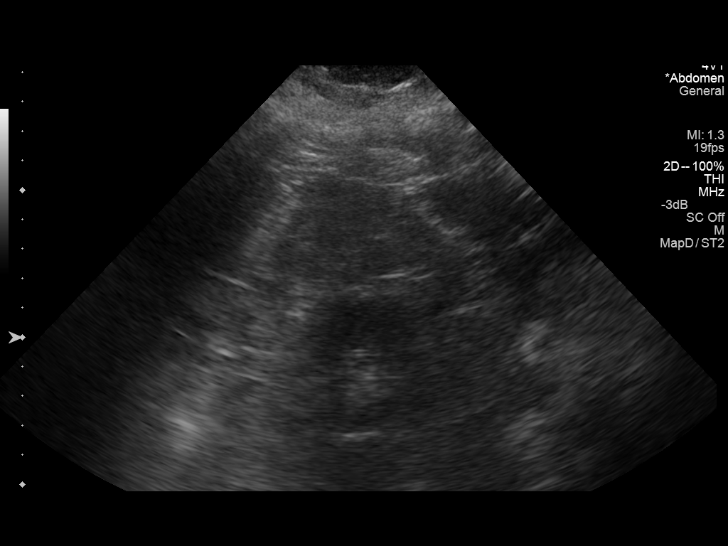
[im 25/33]
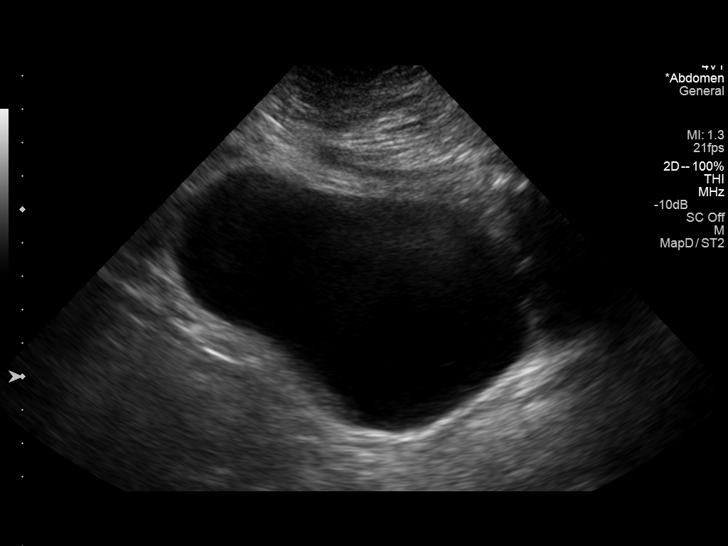
[im 27/33]
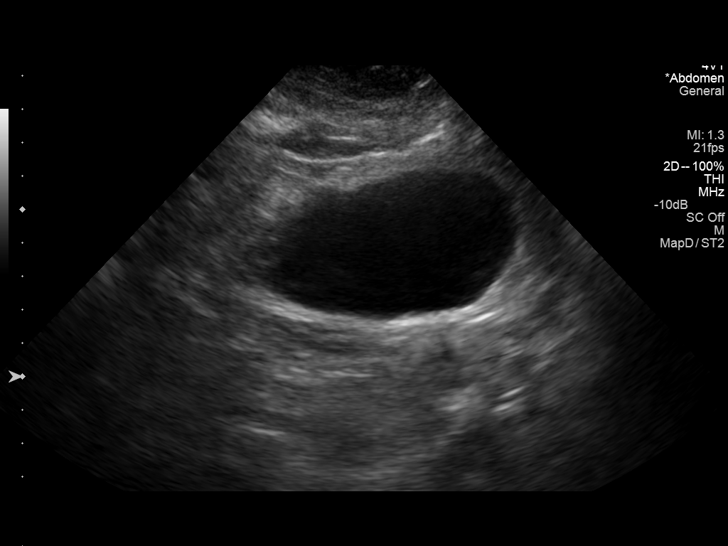
[im 30/33]
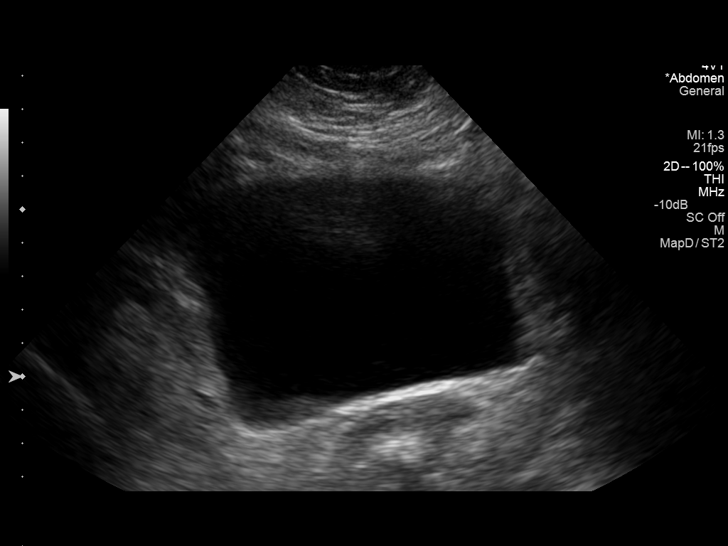
[im 33/33]
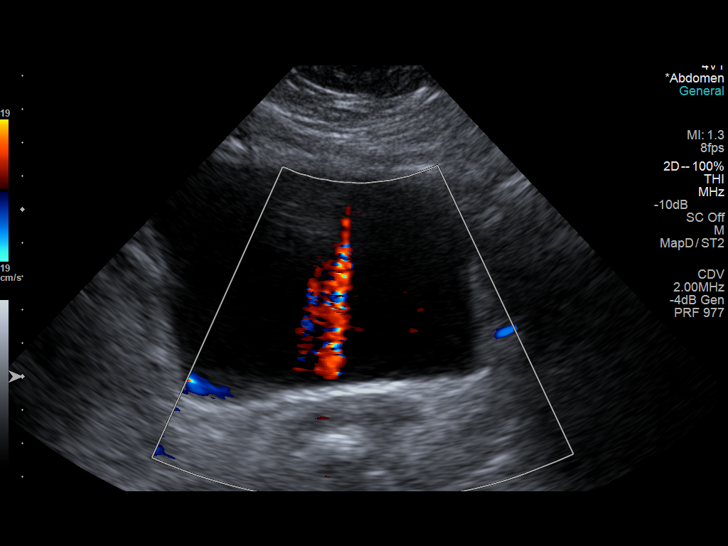

[14 of 25 positions shown; findings below may reference images not displayed]

FINDINGS: Right Kidney:

Length: 11.8 cm. Echogenicity within normal limits. No mass or
hydronephrosis visualized.

Left Kidney:

Length: 11.1 cm. Echogenicity within normal limits. No mass or
hydronephrosis visualized.

Bladder:

Appears normal for degree of bladder distention.
IMPRESSION: Negative renal ultrasound

## 2016-07-28 ENCOUNTER — Ambulatory Visit: Payer: Medicare HMO | Admitting: Neurology

## 2016-07-30 ENCOUNTER — Ambulatory Visit: Payer: Medicare HMO | Admitting: Orthopedic Surgery

## 2016-07-30 DIAGNOSIS — Z4789 Encounter for other orthopedic aftercare: Secondary | ICD-10-CM

## 2016-07-30 NOTE — Progress Notes (Signed)
Bandage removed from right hand, wound clean and dry, no signs or symptoms of infection, sutures removed, patient tolerated well, large band aid placed over wound and ace wrap applied, follow up appointment given

## 2016-08-05 ENCOUNTER — Ambulatory Visit: Payer: Medicare HMO | Admitting: Internal Medicine

## 2016-08-07 ENCOUNTER — Ambulatory Visit: Payer: Medicare HMO | Admitting: Internal Medicine

## 2016-08-11 ENCOUNTER — Ambulatory Visit (INDEPENDENT_AMBULATORY_CARE_PROVIDER_SITE_OTHER): Payer: Self-pay | Admitting: Orthopedic Surgery

## 2016-08-11 ENCOUNTER — Encounter: Payer: Self-pay | Admitting: Orthopedic Surgery

## 2016-08-11 DIAGNOSIS — Z9889 Other specified postprocedural states: Secondary | ICD-10-CM

## 2016-08-11 DIAGNOSIS — Z4889 Encounter for other specified surgical aftercare: Secondary | ICD-10-CM

## 2016-08-11 MED ORDER — HYDROCODONE-ACETAMINOPHEN 7.5-325 MG PO TABS: 1.0000 | ORAL_TABLET | Freq: Four times a day (QID) | ORAL | 0 refills | Status: DC | PRN
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
## 2016-08-12 ENCOUNTER — Ambulatory Visit: Payer: Self-pay | Admitting: Psychology

## 2016-08-14 ENCOUNTER — Other Ambulatory Visit (HOSPITAL_COMMUNITY): Payer: Medicare HMO

## 2016-08-14 ENCOUNTER — Encounter (HOSPITAL_COMMUNITY): Payer: Medicare HMO | Attending: Hematology & Oncology

## 2016-08-14 DIAGNOSIS — D693 Immune thrombocytopenic purpura: Secondary | ICD-10-CM | POA: Insufficient documentation

## 2016-08-14 LAB — BASIC METABOLIC PANEL
Anion gap: 4 — ABNORMAL LOW (ref 5–15)
BUN: 23 mg/dL — AB (ref 6–20)
CO2: 26 mmol/L (ref 22–32)
CREATININE: 1.43 mg/dL — AB (ref 0.44–1.00)
Calcium: 9 mg/dL (ref 8.9–10.3)
Chloride: 110 mmol/L (ref 101–111)
GFR calc Af Amer: 49 mL/min — ABNORMAL LOW (ref >60)
GFR, EST NON AFRICAN AMERICAN: 42 mL/min — AB (ref >60)
Glucose, Bld: 113 mg/dL — ABNORMAL HIGH (ref 65–99)
Potassium: 4.5 mmol/L (ref 3.5–5.1)
Sodium: 140 mmol/L (ref 135–145)

## 2016-08-14 LAB — CBC WITH DIFFERENTIAL/PLATELET
BASOS ABS: 0 10*3/uL (ref 0.0–0.1)
BASOS PCT: 1 %
Eosinophils Absolute: 0.2 10*3/uL (ref 0.0–0.7)
Eosinophils Relative: 3 %
HEMATOCRIT: 43.6 % (ref 36.0–46.0)
HEMOGLOBIN: 13.8 g/dL (ref 12.0–15.0)
LYMPHS PCT: 48 %
Lymphs Abs: 3.1 10*3/uL (ref 0.7–4.0)
MCH: 28.2 pg (ref 26.0–34.0)
MCHC: 31.7 g/dL (ref 30.0–36.0)
MCV: 89 fL (ref 78.0–100.0)
MONO ABS: 0.6 10*3/uL (ref 0.1–1.0)
Monocytes Relative: 9 %
NEUTROS ABS: 2.6 10*3/uL (ref 1.7–7.7)
NEUTROS PCT: 39 %
Platelets: 195 10*3/uL (ref 150–400)
RBC: 4.9 MIL/uL (ref 3.87–5.11)
RDW: 15.9 % — AB (ref 11.5–15.5)
WBC: 6.5 10*3/uL (ref 4.0–10.5)

## 2016-08-18 DIAGNOSIS — H26493 Other secondary cataract, bilateral: Secondary | ICD-10-CM | POA: Diagnosis not present

## 2016-08-18 DIAGNOSIS — H52203 Unspecified astigmatism, bilateral: Secondary | ICD-10-CM | POA: Diagnosis not present

## 2016-08-24 ENCOUNTER — Ambulatory Visit (HOSPITAL_COMMUNITY): Payer: Medicare HMO | Admitting: Oncology

## 2016-08-24 NOTE — Assessment & Plan Note (Signed)
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    Labs on 08/14/2016: CBC diff, BMET. I personally reviewed and went over laboratory results with the patient.  The results are noted within this dictation.  Labs in 8 weeks: CBC  Labs in 16 weeks: CBC diff, BMET.  Patient educated on the role of corticosteroids and ITP.  She is educated on her platelet count and the goal of maintaining a platelet count of 60,000 or greater is most reasonable.  She will not be prescribed dexamethasone moving forward unless indicated.  She is also educated on newer agents for her ITP if needed that would be more appropriate in Summer Cook's situation (ie N-PLATE, PROMACTA).    Next mammogram is due in June 2018.  Return in 16 weeks for follow-up.

## 2016-08-24 NOTE — Progress Notes (Signed)
-

## 2016-09-02 ENCOUNTER — Encounter (HOSPITAL_COMMUNITY): Payer: Self-pay | Admitting: Hematology & Oncology

## 2016-09-02 ENCOUNTER — Encounter (HOSPITAL_COMMUNITY): Payer: Medicare HMO | Attending: Hematology & Oncology | Admitting: Hematology & Oncology

## 2016-09-02 ENCOUNTER — Telehealth: Payer: Self-pay | Admitting: Emergency Medicine

## 2016-09-02 VITALS — BP 127/78 | HR 94 | Temp 98.7°F | Resp 16 | Wt 193.2 lb

## 2016-09-02 DIAGNOSIS — D693 Immune thrombocytopenic purpura: Secondary | ICD-10-CM | POA: Insufficient documentation

## 2016-09-02 DIAGNOSIS — Z9081 Acquired absence of spleen: Secondary | ICD-10-CM

## 2016-09-02 NOTE — Telephone Encounter (Signed)
Would you provide an rx for a new nebulizer?

## 2016-09-02 NOTE — Progress Notes (Signed)
Summer Koch, MD 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 female returns for follow-up of ITP.   She has been started on baby aspirin for a CVA. She notes that she had her stroke in March, and graduated from speech therapy. Summer Cook returns to the [facility] today accompanied by her aunt who raised her, her daughter, and her 92 month old granddaughter.    I have reviewed the labs with the patient.   She says that she has been doing well other than some carpal tunnel, which she had surgery on. She had an infection in her left wrist after the surgery because her hands sweat into the wound. Summer Cook did her surgery.   She has a cold, so she is congested.  She had her mammogram on February 28, 2016.   She has lost some weight. She has not been eating. Her daughter says that she has only been eating one meal a day does not believe that this is the best way to try to lose weight. Pt says she had to do something to lose weight.  She has not been taking her antidepressant because it was making her "eat everything in the house". She says that she is emotional without the medication, though.  No bleeding or bruising.   MEDICAL HISTORY: Past Medical History:  Diagnosis Date  . Allergy   . Arthritis   . Asthma   . Candida esophagitis ([place])   . Chronic chest pain   . Depression   . Essential hypertension, benign   . GERD (gastroesophageal reflux disease)   . Gout   . Hemorrhoids   . 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  Last was Prednisone '40mg'$ ; 2 wk in 10/2012.  She also was  on Prednisone bridged with Cellcept briefly but stopped due to lack of insurance.   . Normal cardiac stress test 02/2014  . Pulmonary embolism ([place]) 04/2012  . Renal insufficiency   . Serrated adenoma of colon   . Steroid-induced diabetes ([place])   . Stroke ([place])   . Thrush, oral 05/29/2011    has Hypertension; Idiopathic thrombocytopenic purpura ([place]); Tachycardia; Hemorrhoids; Steroid-induced diabetes ([place]); Post-splenectomy; Immunosuppressed status ([place]); Depression; Obesity; PE (pulmonary embolism); Rectal bleeding; Unspecified constipation; Nausea with vomiting; Abnormal LFTs; Orthostatic hypotension; CKD (chronic kidney disease) stage 3, GFR 30-59 ml/min; Asthma, chronic; Anxiety state; Back pain; Cerebrovascular accident (CVA) due to embolism ([place]); Chronic back pain; History of stroke; C7 radiculopathy; Carpal tunnel syndrome, left; and Carpal tunnel syndrome of right wrist on her problem list.     is allergic to brintellix [vortioxetine]; yellow jacket venom; adhesive [tape]; and zoloft [sertraline hcl].  Summer Cook had no medications administered during this visit.  SURGICAL HISTORY: Past Surgical History:  Procedure Laterality Date  . BONE MARROW BIOPSY    . CARPAL TUNNEL RELEASE    . CARPAL TUNNEL RELEASE Left 05/20/2016   Procedure: CARPAL TUNNEL RELEASE;  Surgeon: Summer Civil, MD;  Location: [facility];  Service: Orthopedics;  Laterality: Left;  . CARPAL TUNNEL RELEASE Right 07/16/2016   Procedure: RIGHT CARPAL TUNNEL RELEASE;  Surgeon: Summer Civil, MD;  Location: [facility];  Service: Orthopedics;  Laterality: Right;  . CATARACT EXTRACTION    . CHOLECYSTECTOMY  2008  . COLONOSCOPY WITH ESOPHAGOGASTRODUODENOSCOPY (EGD) N/A 01/04/2013   Summer Cook: esophageal plaques with +KOH, hh. Gastric antrum abnormal, bx reactive gastropathy. Anal canal hemorrhoids, colonic tics, normal TI, single polyp (sessile serrated tubular adenoma). Next TCS 12/2017  . 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  TEE WITHOUT CARDIOVERSION N/A 01/03/2016   Procedure: TRANSESOPHAGEAL ECHOCARDIOGRAM (TEE) WITH PROPOFOL;  Surgeon: Summer Sark, MD;  Location: [facility];  Service: Cardiovascular;  Laterality: N/A;    SOCIAL HISTORY: Social History   Social History  . Marital status: Divorced    Spouse name: N/A  . Number of children: 1  . Years of education: N/A   Occupational History  .  Unemployed    was a Educational psychologist; last worked 2012.    Social History Main Topics  . Smoking status: Former Smoker    Years: 0.00    Types: Cigarettes    Quit date: 02/14/2009  . Smokeless tobacco: Never Used  . Alcohol use No  . Drug use: No  . Sexual activity: No   Other Topics Concern  . Not on file   Social History Narrative   Lives with her daughter and aunt in a one story home.  Has 1 daughter.  On disability.  Did work as a Optometrist and bus Geophysicist/field seismologist.  Education: some college.     FAMILY HISTORY: Family History  Problem Relation Age of Onset  . Cervical cancer Mother   . Lung cancer Father   . Colon polyps Father     ???  . Pneumonia Brother   . Colon cancer Paternal Grandmother 88  . AAA (abdominal aortic aneurysm) Paternal Grandmother   . Breast cancer Paternal Grandmother   . Colon cancer Paternal Grandfather 52  . Barrett's esophagus Paternal Aunt   . Stomach cancer Neg Hx   . Pancreatic cancer Neg Hx     Review of Systems  Constitutional: Positive for weight loss (only eating 1 meal a day).  HENT: Positive for congestion.   Eyes: Negative.   Respiratory: Negative.   Cardiovascular: Negative.   Gastrointestinal: Negative.   Genitourinary: Negative.   Musculoskeletal: Negative.   Skin: Negative.   Neurological: Negative.   Endo/Heme/Allergies: Negative.   Psychiatric/Behavioral: Positive for depression (has not been taking her antidepressants).  All other systems reviewed and are negative.  14 point review of systems was performed and is negative except as detailed  under history of present illness and above   PHYSICAL EXAMINATION  ECOG PERFORMANCE STATUS: 1 - Symptomatic but completely ambulatory  Vitals:   09/02/16 1054  BP: 127/78  Pulse: 94  Resp: 16  Temp: 98.7 F (37.1 C)     Physical Exam  Constitutional: She is oriented to person, place, and time and well-developed, well-nourished, and in no distress.  Pt was able to get on exam table without assistance.   HENT:  Head: Normocephalic and atraumatic.  Nose: Nose normal.  Mouth/Throat: Oropharynx is clear and moist. No oropharyngeal exudate.  Eyes: Conjunctivae and EOM are normal. Pupils are equal, round, and reactive to light. Right eye exhibits no discharge. Left eye exhibits no discharge. No scleral icterus.  Neck: Normal range of motion. Neck supple. No tracheal deviation present. No thyromegaly present.  Cardiovascular: Normal rate, regular rhythm and normal heart sounds.  Exam reveals no gallop and no friction rub.  No murmur heard. Pulmonary/Chest: Effort normal and breath sounds normal. She has no wheezes. She has no rales.  Abdominal: Soft. Bowel sounds are normal. She exhibits no distension and no mass. There is no tenderness. There is no rebound and no guarding.  Musculoskeletal: Normal range of motion. She exhibits no edema.  Lymphadenopathy:    She has no cervical adenopathy.  Neurological: She is alert and oriented to person, place, and time. She has normal reflexes. No cranial nerve deficit. Gait normal. Coordination normal.  Skin: Skin is warm and dry. No rash noted.  Psychiatric: Mood, memory, affect and judgment normal.  Nursing note and vitals reviewed.   LABORATORY DATA: I have reviewed the data below as listed.  Results for LIAN, POUNDS (MRN [id number]) as of 09/02/2016 08:21  Ref. Range 08/14/2016 10:10  Sodium Latest Ref Range: 135 - 145 mmol/L 140  Potassium Latest Ref Range: 3.5 - 5.1 mmol/L 4.5  Chloride Latest Ref Range: 101 - 111 mmol/L 110  CO2  Latest Ref Range: 22 - 32 mmol/L 26  BUN Latest Ref Range: 6 - 20 mg/dL 23 (H)  Creatinine Latest Ref Range: 0.44 - 1.00 mg/dL 1.43 (H)  Calcium Latest Ref Range: 8.9 - 10.3 mg/dL 9.0  EGFR (Non-African Amer.) Latest Ref Range: >60 mL/min 42 (L)  EGFR (African American) Latest Ref Range: >60 mL/min 49 (L)  Glucose Latest Ref Range: 65 - 99 mg/dL 113 (H)  Anion gap Latest Ref Range: 5 - 15  4 (L)  WBC Latest Ref Range: 4.0 - 10.5 K/uL 6.5  RBC Latest Ref Range: 3.87 - 5.11 MIL/uL 4.90  Hemoglobin Latest Ref Range: 12.0 - 15.0 g/dL 13.8  HCT Latest Ref Range: 36.0 - 46.0 % 43.6  MCV Latest Ref Range: 78.0 - 100.0 fL 89.0  MCH Latest Ref Range: 26.0 - 34.0 pg 28.2  MCHC Latest Ref Range: 30.0 - 36.0 g/dL 31.7  RDW Latest Ref Range: 11.5 - 15.5 % 15.9 (H)  Platelets Latest Ref Range: 150 - 400 K/uL 195  Neutrophils Latest Units: % 39  Lymphocytes Latest Units: % 48  Monocytes Relative Latest Units: % 9  Eosinophil Latest Units: % 3  Basophil Latest Units: % 1  NEUT# Latest Ref Range: 1.7 - 7.7 K/uL 2.6  Lymphocyte # Latest Ref Range: 0.7 - 4.0 K/uL 3.1  Monocyte # Latest Ref Range: 0.1 - 1.0 K/uL 0.6  Eosinophils Absolute Latest Ref Range: 0.0 - 0.7 K/uL 0.2  Basophils Absolute Latest Ref Range: 0.0 - 0.1 K/uL 0.0   Results for SHAHD, OCCHIPINTI (MRN [id number]) as of 09/02/2016 08:21  Ref. Range 06/17/2016 11:43 06/17/2016 15:42 07/03/2016 09:50 07/13/2016 09:04 08/14/2016 10:10  Platelets Latest Ref Range: 150 - 400 K/uL   119 (L) 165 195    Radiographic Studies I have reviewed the below radiographic studies and agree with their findings.  Study Result   CLINICAL DATA:  Syncope.  History of PE 2 years ago.  EXAM: NUCLEAR MEDICINE VENTILATION - PERFUSION LUNG SCAN  TECHNIQUE: Ventilation images were obtained in multiple projections using inhaled aerosol Tc-91mDTPA. Perfusion images were obtained in multiple projections after intravenous injection of Tc-949mMAA.  RADIOPHARMACEUTICALS:  30.5 mCi Technetium-9943mPA aerosol inhalation and 4.2 mCi Technetium-99m81m IV  COMPARISON:  Chest x-ray from earlier today  FINDINGS: Ventilation: No focal ventilation defect.  Perfusion: No wedge shaped peripheral perfusion defects to suggest acute pulmonary embolism.  IMPRESSION: Negative for pulmonary embolism.   Electronically Signed  By: Monte Fantasia M.D.   On: 06/17/2016 15:32     ASSESSMENT and THERAPY PLAN:   ITP  50 year old female with ITP diagnosed in 2011. She failed splenectomy. She has been off all therapy for some time. Counts have been excellent. She is to continue on her aspirin as prescribed by her neurologist.  I reassured her today that if needed newer agents in ITP would be much safer for her to use long term ie. promacta than ongoing steroids.   I suggested asking her doctor to write her a prescription for an antidepressant that doesn't cause her to want to eat so much, so that her mood is good, and she can lose weight.   She will return for a follow up in 4 months.   Screening  She is overdue for her screening mammogram. Order is placed.   This document serves as a record of services personally performed by Ancil Linsey, MD. It was created on her behalf by Martinique Casey, a trained medical scribe. The creation of this record is based on the scribe's personal observations and the provider's statements to them. This document has been checked and approved by the attending provider.  I have reviewed the above documentation for accuracy and completeness, and I agree with the above.  This note was electronically signed.   Kelby Fam. Whitney Muse, MD

## 2016-09-02 NOTE — Telephone Encounter (Signed)
Spoke to patient and advised her to call her insurance company

## 2016-09-02 NOTE — Telephone Encounter (Signed)
I don't think we've ever provider any nebulizer prescriptions for her, does she still use this? She would need to contact her insurance company to see if they would pay for one.

## 2016-09-02 NOTE — Telephone Encounter (Signed)
Pt called and her nebulizer machine isnt working. She is wondering if she can get another one. Please advise thanks.

## 2016-09-02 NOTE — Patient Instructions (Addendum)
[facility] at [facility] Discharge Instructions  RECOMMENDATIONS MADE BY THE CONSULTANT AND ANY TEST RESULTS WILL BE SENT TO YOUR REFERRING PHYSICIAN.  You saw Dr.Penland today. Follow up in 4 months with labs. See Amy at checkout for appointments.  Thank you for choosing [facility] at [facility] to provide your oncology and hematology care.  To afford each patient quality time with our provider, please arrive at least 15 minutes before your scheduled appointment time.   Beginning January 23rd 2017 lab work for the Cancer Center will be done in the  Main lab at [facility] on 1st floor. If you have a lab appointment with the Cancer Center please come in thru the  Main Entrance and check in at the main information desk  You need to re-schedule your appointment should you arrive 10 or more minutes late.  We strive to give you quality time with our providers, and arriving late affects you and other patients whose appointments are after yours.  Also, if you no show three or more times for appointments you may be dismissed from the clinic at the providers discretion.     Again, thank you for choosing [facility].  Our hope is that these requests will decrease the amount of time that you wait before being seen by our physicians.       _____________________________________________________________  Should you have questions after your visit to [facility], please contact our office at [phone] between the hours of 8:30 a.m. and 4:30 p.m.  Voicemails left after 4:30 p.m. will not be returned until the following business day.  For prescription refill requests, have your pharmacy contact our office.         Resources For Cancer Patients and their Caregivers ? American Cancer Society: Can assist with transportation, wigs, general needs, runs Look Good Feel Better.        1-888-227-6333 ? Cancer Care: Provides financial  assistance, online support groups, medication/co-pay assistance.  1-800-813-HOPE (4673) ? [facility] Assists [place] cancer patients and their families through emotional , educational and financial support.  336-427-4357 ? [place] DSS Where to apply for food stamps, Medicaid and utility assistance. 336-342-1394 ? [facility]: Transportation to medical appointments. 336-347-2287 ? Social Security Administration: May apply for disability if have a Stage IV cancer. 336-342-7796 1-800-772-1213 ? [place] Aging, Disability and Transit Services: Assists with nutrition, care and transit needs. 336-349-2343  Cancer Center Support Programs: @10RELATIVEDAYS@ > Cancer Support Group  2nd Tuesday of the month 1pm-2pm, Journey Room  > Creative Journey  3rd Tuesday of the month 1130am-1pm, Journey Room  > Look Good Feel Better  1st Wednesday of the month 10am-12 noon, Journey Room (Call American Cancer Society to register 1-800-395-5775)    

## 2016-09-05 ENCOUNTER — Other Ambulatory Visit: Payer: Self-pay | Admitting: Orthopedic Surgery

## 2016-09-23 ENCOUNTER — Ambulatory Visit (HOSPITAL_COMMUNITY): Payer: Medicare HMO | Admitting: Oncology

## 2016-09-25 ENCOUNTER — Encounter (HOSPITAL_COMMUNITY): Payer: Self-pay | Admitting: Emergency Medicine

## 2016-09-25 ENCOUNTER — Emergency Department (HOSPITAL_COMMUNITY)
Admission: EM | Admit: 2016-09-25 | Discharge: 2016-09-25 | Disposition: A | Discharge status: Home / Self Care | Payer: Medicare HMO | Attending: Emergency Medicine | Admitting: Emergency Medicine

## 2016-09-25 ENCOUNTER — Other Ambulatory Visit: Payer: Self-pay

## 2016-09-25 ENCOUNTER — Emergency Department (HOSPITAL_COMMUNITY): Payer: Medicare HMO

## 2016-09-25 DIAGNOSIS — E1122 Type 2 diabetes mellitus with diabetic chronic kidney disease: Secondary | ICD-10-CM | POA: Insufficient documentation

## 2016-09-25 DIAGNOSIS — R000 Tachycardia, unspecified: Secondary | ICD-10-CM | POA: Diagnosis present

## 2016-09-25 DIAGNOSIS — Z79899 Other long term (current) drug therapy: Secondary | ICD-10-CM | POA: Insufficient documentation

## 2016-09-25 DIAGNOSIS — M25512 Pain in left shoulder: Secondary | ICD-10-CM | POA: Diagnosis not present

## 2016-09-25 DIAGNOSIS — R51 Headache: Secondary | ICD-10-CM | POA: Diagnosis not present

## 2016-09-25 DIAGNOSIS — R519 Headache, unspecified: Secondary | ICD-10-CM

## 2016-09-25 DIAGNOSIS — R0981 Nasal congestion: Secondary | ICD-10-CM | POA: Insufficient documentation

## 2016-09-25 DIAGNOSIS — N183 Chronic kidney disease, stage 3 (moderate): Secondary | ICD-10-CM | POA: Diagnosis not present

## 2016-09-25 DIAGNOSIS — M791 Myalgia: Secondary | ICD-10-CM | POA: Insufficient documentation

## 2016-09-25 DIAGNOSIS — J45909 Unspecified asthma, uncomplicated: Secondary | ICD-10-CM | POA: Diagnosis not present

## 2016-09-25 DIAGNOSIS — Z87891 Personal history of nicotine dependence: Secondary | ICD-10-CM | POA: Diagnosis not present

## 2016-09-25 DIAGNOSIS — R05 Cough: Secondary | ICD-10-CM | POA: Insufficient documentation

## 2016-09-25 DIAGNOSIS — R509 Fever, unspecified: Secondary | ICD-10-CM | POA: Insufficient documentation

## 2016-09-25 DIAGNOSIS — I129 Hypertensive chronic kidney disease with stage 1 through stage 4 chronic kidney disease, or unspecified chronic kidney disease: Secondary | ICD-10-CM | POA: Diagnosis not present

## 2016-09-25 DIAGNOSIS — I10 Essential (primary) hypertension: Secondary | ICD-10-CM | POA: Diagnosis not present

## 2016-09-25 DIAGNOSIS — R110 Nausea: Secondary | ICD-10-CM | POA: Diagnosis not present

## 2016-09-25 LAB — COMPREHENSIVE METABOLIC PANEL
ALK PHOS: 151 U/L — AB (ref 38–126)
ALT: 19 U/L (ref 14–54)
ANION GAP: 12 (ref 5–15)
AST: 32 U/L (ref 15–41)
Albumin: 4.5 g/dL (ref 3.5–5.0)
BILIRUBIN TOTAL: 0.8 mg/dL (ref 0.3–1.2)
BUN: 18 mg/dL (ref 6–20)
CALCIUM: 9.8 mg/dL (ref 8.9–10.3)
CO2: 22 mmol/L (ref 22–32)
Chloride: 106 mmol/L (ref 101–111)
Creatinine, Ser: 1.66 mg/dL — ABNORMAL HIGH (ref 0.44–1.00)
GFR, EST AFRICAN AMERICAN: 41 mL/min — AB (ref >60)
GFR, EST NON AFRICAN AMERICAN: 35 mL/min — AB (ref >60)
GLUCOSE: 93 mg/dL (ref 65–99)
Potassium: 4 mmol/L (ref 3.5–5.1)
Sodium: 140 mmol/L (ref 135–145)
TOTAL PROTEIN: 8.2 g/dL — AB (ref 6.5–8.1)

## 2016-09-25 LAB — URINALYSIS, ROUTINE W REFLEX MICROSCOPIC
Bilirubin Urine: NEGATIVE
Glucose, UA: NEGATIVE mg/dL
Ketones, ur: NEGATIVE mg/dL
Leukocytes, UA: NEGATIVE
NITRITE: NEGATIVE
Protein, ur: >=300 mg/dL — AB
SPECIFIC GRAVITY, URINE: 1.005 (ref 1.005–1.030)
pH: 7 (ref 5.0–8.0)

## 2016-09-25 LAB — TSH: TSH: 2.303 u[IU]/mL (ref 0.350–4.500)

## 2016-09-25 LAB — CBC WITH DIFFERENTIAL/PLATELET
Basophils Absolute: 0.1 10*3/uL (ref 0.0–0.1)
Basophils Relative: 1 %
Eosinophils Absolute: 0.1 10*3/uL (ref 0.0–0.7)
Eosinophils Relative: 1 %
HEMATOCRIT: 53.2 % — AB (ref 36.0–46.0)
HEMOGLOBIN: 17.7 g/dL — AB (ref 12.0–15.0)
LYMPHS ABS: 3.4 10*3/uL (ref 0.7–4.0)
Lymphocytes Relative: 42 %
MCH: 29.3 pg (ref 26.0–34.0)
MCHC: 33.3 g/dL (ref 30.0–36.0)
MCV: 87.9 fL (ref 78.0–100.0)
MONOS PCT: 7 %
Monocytes Absolute: 0.6 10*3/uL (ref 0.1–1.0)
NEUTROS ABS: 4 10*3/uL (ref 1.7–7.7)
NEUTROS PCT: 49 %
Platelets: 172 10*3/uL (ref 150–400)
RBC: 6.05 MIL/uL — ABNORMAL HIGH (ref 3.87–5.11)
RDW: 14.2 % (ref 11.5–15.5)
WBC: 8.1 10*3/uL (ref 4.0–10.5)

## 2016-09-25 MED ORDER — ONDANSETRON HCL 4 MG PO TABS: 4.0000 mg | ORAL_TABLET | Freq: Four times a day (QID) | ORAL | 0 refills | Status: DC | PRN

## 2016-09-25 MED ORDER — METOCLOPRAMIDE HCL 5 MG/ML IJ SOLN
10.0000 mg | Freq: Once | INTRAMUSCULAR | Status: AC
  Administered 2016-09-25: 10 mg via INTRAVENOUS
  Filled 2016-09-25: qty 2

## 2016-09-25 MED ORDER — ONDANSETRON HCL 4 MG/2ML IJ SOLN
4.0000 mg | Freq: Once | INTRAMUSCULAR | Status: AC
  Administered 2016-09-25: 4 mg via INTRAVENOUS
  Filled 2016-09-25: qty 2

## 2016-09-25 MED ORDER — METOPROLOL TARTRATE 50 MG PO TABS
50.0000 mg | ORAL_TABLET | Freq: Once | ORAL | Status: AC
  Administered 2016-09-25: 50 mg via ORAL
  Filled 2016-09-25: qty 1

## 2016-09-25 MED ORDER — SODIUM CHLORIDE 0.9 % IV BOLUS (SEPSIS)
1000.0000 mL | Freq: Once | INTRAVENOUS | Status: AC
  Administered 2016-09-25: 1000 mL via INTRAVENOUS

## 2016-09-25 MED ORDER — KETOROLAC TROMETHAMINE 30 MG/ML IJ SOLN
30.0000 mg | Freq: Once | INTRAMUSCULAR | Status: AC
  Administered 2016-09-25: 30 mg via INTRAVENOUS
  Filled 2016-09-25: qty 1

## 2016-09-25 MED ORDER — CLONIDINE HCL 0.2 MG PO TABS
0.2000 mg | ORAL_TABLET | Freq: Once | ORAL | Status: AC
Start: 2016-09-25 — End: 2016-09-25
  Administered 2016-09-25: 0.2 mg via ORAL
  Filled 2016-09-25: qty 1

## 2016-09-25 MED ORDER — SODIUM CHLORIDE 0.9 % IV BOLUS (SEPSIS)
500.0000 mL | Freq: Once | INTRAVENOUS | Status: AC
  Administered 2016-09-25: 500 mL via INTRAVENOUS

## 2016-09-25 NOTE — ED Provider Notes (Signed)
[facility] DEPT Provider Note   CSN: [id number] Arrival date & time: 09/25/16  1356     History   Chief Complaint Chief Complaint  Patient presents with  . Tachycardia    HPI Summer Cook is a 50 y.o. female.  HPI Patient presents with multiple complaints. States she is having palpitations described as a racing heart that started last night. He has been constant for the night. She also complains of left shoulder pain starting today. Worse with movement. No known injury. Complains of congestion and cough and states she thinks she's been running a low-grade fever at home. Has had decreased oral intake because she is afraid it will make her vomit. States she took her metoprolol this morning. No new lower extremity swelling or asymmetry. Past Medical History:  Diagnosis Date  . Allergy   . Arthritis   . Asthma   . Candida esophagitis ([place])   . Chronic chest pain   . Depression   . Essential hypertension, benign   . GERD (gastroesophageal reflux disease)   . Gout   . Hemorrhoids   . 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  Last was Prednisone 41m; 2 wk in 10/2012.  She also was on Prednisone bridged with Cellcept briefly but stopped due to lack of insurance.   . Normal cardiac stress test 02/2014  . Pulmonary embolism ([place] 04/2012  . Renal insufficiency   . Serrated adenoma of colon   . Steroid-induced diabetes ([place]   . Stroke ([place]   . Thrush, oral 05/29/2011    Patient Active Problem List   Diagnosis Date Noted  . Carpal tunnel syndrome of right wrist   . Carpal tunnel syndrome, left   . C7 radiculopathy 01/07/2016  . History of stroke   . Chronic back pain 12/23/2015  . 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 Back pain 11/22/2015  . Asthma, chronic 02/16/2015  . Anxiety state 02/16/2015  . CKD (chronic kidney disease) stage  3, GFR 30-59 ml/min 04/28/2014  . Nausea with vomiting 01/25/2013  . Abnormal LFTs 01/25/2013  . Orthostatic hypotension 01/25/2013  . Unspecified constipation 12/27/2012  . Rectal bleeding 12/26/2012  . PE (pulmonary embolism) 05/03/2012  . Tachycardia 06/04/2011  . Hemorrhoids 06/04/2011  . 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 Immunosuppressed status ([place] 06/04/2011  . Depression 06/04/2011  . Obesity 06/04/2011  . Hypertension 03/18/2011  . Idiopathic thrombocytopenic purpura ([place] 03/18/2011    Past Surgical History:  Procedure Laterality Date  . BONE MARROW BIOPSY    . CARPAL TUNNEL RELEASE    . CARPAL TUNNEL RELEASE Left 05/20/2016   Procedure: CARPAL TUNNEL RELEASE;  Surgeon: SCarole Civil MD;  Location: [facility];  Service: Orthopedics;  Laterality: Left;  . CARPAL TUNNEL RELEASE Right 07/16/2016   Procedure: RIGHT CARPAL TUNNEL RELEASE;  Surgeon: SCarole Civil MD;  Location: [facility];  Service: Orthopedics;  Laterality: Right;  . CATARACT EXTRACTION    . CHOLECYSTECTOMY  2008  . COLONOSCOPY WITH ESOPHAGOGASTRODUODENOSCOPY (EGD) N/A 01/04/2013   Dr. RGala Romney esophageal plaques with +KOH, hh. Gastric antrum abnormal, bx reactive gastropathy. Anal canal hemorrhoids, colonic tics, normal TI, single polyp (sessile serrated tubular adenoma). Next TCS 12/2017  . 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 TEE WITHOUT CARDIOVERSION N/A 01/03/2016   Procedure: TRANSESOPHAGEAL ECHOCARDIOGRAM (TEE) WITH PROPOFOL;  Surgeon: SSatira Sark MD;  Location: [facility];  Service: Cardiovascular;  Laterality: N/A;    OB History    No data available       Home Medications    Prior to Admission medications   Medication Sig Start Date End Date Taking? Authorizing Provider  furosemide (LASIX) 20 MG tablet Take 20-40 mg by mouth 2 (two) times daily as needed for fluid. Reported on 01/15/2016 12/16/14  Yes Historical Provider, MD  ipratropium-albuterol  (DUONEB) 0.5-2.5 (3) MG/3ML SOLN Take 3 mLs by nebulization 3 (three) times daily as needed (shorntess of breath/wheezing). Patient taking differently: Take 3 mLs by nebulization 3 (three) times daily as needed (shortness of breath/wheezing).  02/18/15  Yes Kathie Dike, MD  KLOR-CON 10 10 MEQ tablet Take 10 mEq by mouth as needed (Only takes with the furosemide). Reported on 01/15/2016 11/13/14  Yes Historical Provider, MD  metoprolol succinate (TOPROL-XL) 50 MG 24 hr tablet Take 50 mg by mouth daily.  06/09/16  Yes Historical Provider, MD  polyvinyl alcohol (LIQUIFILM TEARS) 1.4 % ophthalmic solution Place 1 drop into both eyes 3 (three) times daily as needed for dry eyes.    Yes Historical Provider, MD  PROAIR RESPICLICK 638 (90 BASE) MCG/ACT AEPB Inhale 2 puffs into the lungs every 4 (four) hours as needed (Shortness of breath).  12/27/14  Yes Historical Provider, MD  tiZANidine (ZANAFLEX) 4 MG tablet TAKE 1 TABLET BY MOUTH EVERY 6 HOURS AS NEEDED FOR MUSCLE SPASMS 09/07/16  Yes Carole Civil, MD  ALPRAZolam Duanne Moron) 0.25 MG tablet Take 1 tablet (0.25 mg total) by mouth 3 (three) times daily as needed for anxiety. Patient not taking: Reported on 09/25/2016 07/20/16   Flossie Buffy, NP  escitalopram (LEXAPRO) 20 MG tablet Take 1 tablet (20 mg total) by mouth daily. Patient not taking: Reported on 09/25/2016 07/20/16   Flossie Buffy, NP  HYDROcodone-acetaminophen (NORCO) 7.5-325 MG tablet Take 1 tablet by mouth every 6 (six) hours as needed for moderate pain. Patient not taking: Reported on 09/25/2016 08/11/16   Carole Civil, MD  ondansetron (ZOFRAN) 4 MG tablet Take 1 tablet (4 mg total) by mouth every 6 (six) hours as needed for nausea or vomiting. 09/25/16   Julianne Rice, MD  promethazine (PHENERGAN) 12.5 MG tablet Take 1 tablet (12.5 mg total) by mouth every 6 (six) hours as needed for nausea or vomiting. Patient not taking: Reported on 09/25/2016 07/21/16   Carole Civil, MD  triamcinolone cream (KENALOG) 0.1 % Apply 1 application topically 2 (two) times daily. Patient not taking: Reported on 09/25/2016 07/11/16   Fransico Meadow, PA-C    Family History Family History  Problem Relation Age of Onset  . Cervical cancer Mother   . Lung cancer Father   . Colon polyps Father     ???  . Pneumonia Brother   . Colon cancer Paternal Grandmother 88  . AAA (abdominal aortic aneurysm) Paternal Grandmother   . Breast cancer Paternal Grandmother   . Colon cancer Paternal Grandfather 62  . Barrett's esophagus Paternal Aunt   . Stomach cancer Neg Hx   . Pancreatic cancer Neg Hx     Social History Social History  Substance Use Topics  . Smoking status: Former Smoker    Years: 0.00    Types: Cigarettes    Quit date: 02/14/2009  . Smokeless tobacco: Never Used  . Alcohol use No     Allergies   Brintellix [vortioxetine]; Yellow jacket venom; Adhesive [tape]; and Zoloft [sertraline hcl]   Review of  Systems Review of Systems  Constitutional: Positive for activity change, appetite change, fatigue and fever.  HENT: Positive for congestion. Negative for sinus pressure and sore throat.   Eyes: Negative for visual disturbance.  Respiratory: Positive for cough. Negative for chest tightness and shortness of breath.   Cardiovascular: Positive for palpitations. Negative for chest pain and leg swelling.  Gastrointestinal: Positive for nausea. Negative for abdominal pain, constipation, diarrhea and vomiting.  Genitourinary: Negative for dysuria, flank pain, frequency and hematuria.  Musculoskeletal: Positive for myalgias. Negative for arthralgias, back pain, neck pain and neck stiffness.  Skin: Negative for wound.  Neurological: Positive for dizziness and light-headedness. Negative for syncope, weakness, numbness and headaches.  All other systems reviewed and are negative.    Physical Exam Updated Vital Signs BP (!) 175/115   Pulse 72   Temp 98.1 F  (36.7 C) (Oral)   Resp (!) 4   Ht 5' 5" (1.651 m)   Wt 193 lb (87.5 kg)   LMP 05/13/2011   SpO2 95%   BMI 32.12 kg/m   Physical Exam  Constitutional: She is oriented to person, place, and time. She appears well-developed and well-nourished. No distress.  HENT:  Head: Normocephalic and atraumatic.  Mouth/Throat: Oropharynx is clear and moist. No oropharyngeal exudate.  Eyes: EOM are normal. Pupils are equal, round, and reactive to light.  Neck: Normal range of motion. Neck supple. No thyromegaly present.  Cardiovascular: Normal rate and regular rhythm.  Exam reveals no gallop and no friction rub.   No murmur heard. Pulmonary/Chest: Effort normal and breath sounds normal. No respiratory distress. She has no wheezes. She has no rales. She exhibits no tenderness.  Abdominal: Soft. Bowel sounds are normal. There is no tenderness. There is no rebound and no guarding.  Musculoskeletal: Normal range of motion. She exhibits no edema or tenderness.  Lymphadenopathy:    She has no cervical adenopathy.  Neurological: She is alert and oriented to person, place, and time.  Patient is alert and oriented x3 with clear, goal oriented speech. Patient has 5/5 motor in all extremities. Sensation is intact to light touch. Bilateral finger-to-nose is normal with no signs of dysmetria. Patient has a normal gait and walks without assistance.  Skin: Skin is warm and dry. No rash noted. No erythema.  Psychiatric:  Flat affect  Nursing note and vitals reviewed.    ED Treatments / Results  Labs (all labs ordered are listed, but only abnormal results are displayed) Labs Reviewed  CBC WITH DIFFERENTIAL/PLATELET - Abnormal; Notable for the following:       Result Value   RBC 6.05 (*)    Hemoglobin 17.7 (*)    HCT 53.2 (*)    All other components within normal limits  COMPREHENSIVE METABOLIC PANEL - Abnormal; Notable for the following:    Creatinine, Ser 1.66 (*)    Total Protein 8.2 (*)    Alkaline  Phosphatase 151 (*)    GFR calc non Af Amer 35 (*)    GFR calc Af Amer 41 (*)    All other components within normal limits  URINALYSIS, ROUTINE W REFLEX MICROSCOPIC - Abnormal; Notable for the following:    Hgb urine dipstick SMALL (*)    Protein, ur >=300 (*)    Bacteria, UA RARE (*)    All other components within normal limits  TSH    EKG  EKG Interpretation None       Radiology Dg Chest 2 View  Result Date: 09/25/2016 CLINICAL DATA:  Heart palpitations, nausea and cough. Hx of stroke in march EXAM: CHEST  2 VIEW COMPARISON:  None 2017 FINDINGS: Heart is upper limits normal. There are no focal consolidations or pleural effusions. No pulmonary edema. There are chronic wedge compression fractures of T12 and T10. Surgical clips are noted in the upper abdomen. IMPRESSION: No evidence for acute cardiopulmonary abnormality. Electronically Signed   By: Nolon Nations M.D.   On: 09/25/2016 16:45   Ct Head Wo Contrast  Result Date: 09/25/2016 CLINICAL DATA:  Dizziness, flu like symptoms x 1 week. Headache, elevated BP. Pt had stroke 3/17. EXAM: CT HEAD WITHOUT CONTRAST TECHNIQUE: Contiguous axial images were obtained from the base of the skull through the vertex without intravenous contrast. COMPARISON:  Head CT 06/17/2016, brain MRI 12/16/2015 FINDINGS: Brain: Small white matter infarctions in the LEFT and RIGHT frontal lobes are again noted. No acute infarction identified by CT. No midline shift or mass effect. No extra-axial fluid collections. Basal cisterns are patent. Vascular: No hyperdense vessel or unexpected calcification. Skull: Normal. Negative for fracture or focal lesion. Sinuses/Orbits: No acute finding. Other: None. IMPRESSION: 1. No acute intracranial findings.  No change from prior. 2. Bilateral frontal white matter infarctions again noted. Electronically Signed   By: Suzy Bouchard M.D.   On: 09/25/2016 17:28    Procedures Procedures (including critical care  time)  Medications Ordered in ED Medications  sodium chloride 0.9 % bolus 500 mL (0 mLs Intravenous Stopped 09/25/16 1952)  ondansetron (ZOFRAN) injection 4 mg (4 mg Intravenous Given 09/25/16 1613)  metoprolol (LOPRESSOR) tablet 50 mg (50 mg Oral Given 09/25/16 1740)  metoCLOPramide (REGLAN) injection 10 mg (10 mg Intravenous Given 09/25/16 1934)  sodium chloride 0.9 % bolus 1,000 mL (1,000 mLs Intravenous New Bag/Given 09/25/16 1934)  ketorolac (TORADOL) 30 MG/ML injection 30 mg (30 mg Intravenous Given 09/25/16 1934)  cloNIDine (CATAPRES) tablet 0.2 mg (0.2 mg Oral Given 09/25/16 1933)     Initial Impression / Assessment and Plan / ED Course  I have reviewed the triage vital signs and the nursing notes.  Pertinent labs & imaging results that were available during my care of the patient were reviewed by me and considered in my medical decision making (see chart for details).  Clinical Course    Patient with elevated blood pressure. Suspect there is some degree of noncompliance. Patient also has headache while in the emergency department. Normal neurologic exam. CT without evidence of acute intracranial abnormality. We'll discharge home to follow-up with her primary physician. Encouraged to take medications as previously prescribed. Return precautions given.  Final Clinical Impressions(s) / ED Diagnoses   Final diagnoses:  Uncontrolled hypertension  Headache, unspecified headache type    New Prescriptions New Prescriptions   ONDANSETRON (ZOFRAN) 4 MG TABLET    Take 1 tablet (4 mg total) by mouth every 6 (six) hours as needed for nausea or vomiting.     Julianne Rice, MD 09/25/16 2109

## 2016-09-25 NOTE — ED Notes (Signed)
Pt states that she has had flu symptoms for 1 week.  She states that she has been having diarrhea, cough, body aches, congestion, nausea, low grade fever.  Pt states that it feels like she has a weight on her left shoulder.  Her daughter and granddaughter has had the same symptoms.

## 2016-09-25 NOTE — ED Notes (Signed)
Made MD aware of blood pressure increase.

## 2016-09-25 NOTE — ED Notes (Signed)
Urine recollected and sent to lab

## 2016-09-25 NOTE — ED Triage Notes (Signed)
Pt had stroke in March, today, palpations, nausea

## 2016-09-25 NOTE — ED Notes (Signed)
Called lab to check status of urine, apparently urine spilled, she states they need a recollect. Pt informed

## 2016-09-25 NOTE — ED Notes (Signed)
Pt left at this time, states she did not have time to wait for discharge instrictions or discharge prescriptions.

## 2016-10-25 ENCOUNTER — Encounter (HOSPITAL_COMMUNITY): Payer: Self-pay | Admitting: Hematology & Oncology

## 2016-11-05 ENCOUNTER — Emergency Department (HOSPITAL_COMMUNITY): Payer: Medicare HMO

## 2016-11-05 ENCOUNTER — Encounter (HOSPITAL_COMMUNITY): Payer: Self-pay | Admitting: *Deleted

## 2016-11-05 ENCOUNTER — Emergency Department (HOSPITAL_COMMUNITY)
Admission: EM | Admit: 2016-11-05 | Discharge: 2016-11-06 | Disposition: A | Discharge status: Home / Self Care | Payer: Medicare HMO | Attending: Emergency Medicine | Admitting: Emergency Medicine

## 2016-11-05 DIAGNOSIS — I129 Hypertensive chronic kidney disease with stage 1 through stage 4 chronic kidney disease, or unspecified chronic kidney disease: Secondary | ICD-10-CM | POA: Diagnosis not present

## 2016-11-05 DIAGNOSIS — J0110 Acute frontal sinusitis, unspecified: Secondary | ICD-10-CM | POA: Insufficient documentation

## 2016-11-05 DIAGNOSIS — R350 Frequency of micturition: Secondary | ICD-10-CM | POA: Insufficient documentation

## 2016-11-05 DIAGNOSIS — R509 Fever, unspecified: Secondary | ICD-10-CM

## 2016-11-05 DIAGNOSIS — R112 Nausea with vomiting, unspecified: Secondary | ICD-10-CM | POA: Insufficient documentation

## 2016-11-05 DIAGNOSIS — R05 Cough: Secondary | ICD-10-CM | POA: Diagnosis not present

## 2016-11-05 DIAGNOSIS — J45909 Unspecified asthma, uncomplicated: Secondary | ICD-10-CM | POA: Diagnosis not present

## 2016-11-05 DIAGNOSIS — N183 Chronic kidney disease, stage 3 (moderate): Secondary | ICD-10-CM | POA: Insufficient documentation

## 2016-11-05 DIAGNOSIS — R059 Cough, unspecified: Secondary | ICD-10-CM

## 2016-11-05 DIAGNOSIS — Z87891 Personal history of nicotine dependence: Secondary | ICD-10-CM | POA: Insufficient documentation

## 2016-11-05 LAB — INFLUENZA PANEL BY PCR (TYPE A & B)
INFLAPCR: NEGATIVE
INFLBPCR: NEGATIVE

## 2016-11-05 MED ORDER — ACETAMINOPHEN 500 MG PO TABS
1000.0000 mg | ORAL_TABLET | Freq: Once | ORAL | Status: AC
  Administered 2016-11-05: 1000 mg via ORAL
  Filled 2016-11-05: qty 2

## 2016-11-05 MED ORDER — BENZONATATE 100 MG PO CAPS
100.0000 mg | ORAL_CAPSULE | Freq: Three times a day (TID) | ORAL | 0 refills | Status: DC
Start: 2016-11-05 — End: 2017-02-25

## 2016-11-05 MED ORDER — DOXYCYCLINE HYCLATE 100 MG PO TABS
100.0000 mg | ORAL_TABLET | Freq: Once | ORAL | Status: AC
  Administered 2016-11-06: 100 mg via ORAL
  Filled 2016-11-05: qty 1

## 2016-11-05 MED ORDER — SODIUM CHLORIDE 0.9 % IV BOLUS (SEPSIS)
1000.0000 mL | Freq: Once | INTRAVENOUS | Status: AC
  Administered 2016-11-05: 1000 mL via INTRAVENOUS

## 2016-11-05 MED ORDER — DOXYCYCLINE HYCLATE 100 MG PO CAPS: 100.0000 mg | ORAL_CAPSULE | Freq: Two times a day (BID) | ORAL | 0 refills | Status: DC

## 2016-11-05 NOTE — ED Triage Notes (Signed)
Pt reports fever and emesis since this morning, cough, congestion x 1 week.

## 2016-11-05 NOTE — ED Notes (Signed)
Pt c/o cough and congestion for the past week with clear sputum production, fever and emesis that started today,

## 2016-11-05 NOTE — ED Notes (Signed)
Pt returned from xray

## 2016-11-05 NOTE — ED Provider Notes (Signed)
[facility] DEPT Provider Note   CSN: [id number] Arrival date & time: 11/05/16  2045  By signing my name below, I, Dora Sims, attest that this documentation has been prepared under the direction and in the presence of physician practitioner, Merrily Pew, MD. Electronically Signed: Dora Sims, Scribe. 11/05/2016. 10:16 PM.  History   Chief Complaint Chief Complaint  Patient presents with  . Fever    The history is provided by the patient. No language interpreter was used.     HPI Comments: Summer Cook is a 51 y.o. female with PMHx including HTN, CKD, asthma, and PE who presents to the Emergency Department complaining of a persistent fever (tmax 103) beginning today. She reports associated generalized myalgias, nausea, and one episode of vomiting this evening. She notes she has had nasal congestion, a persistent cough, and urinary frequency for one week. Pt notes she has measured her oxygen saturation at home and it has been between 93-95% recently which is low for her. She reports she has one sick contact but notes no others. Pt states she received a flu vaccination this season. No recent immobilizations or foreign travel. She denies abdominal pain, dysuria, CP, SOB, or any other associated symptoms.  Past Medical History:  Diagnosis Date  . Allergy   . Arthritis   . Asthma   . Candida esophagitis ([place])   . Chronic chest pain   . Depression   . Essential hypertension, benign   . GERD (gastroesophageal reflux disease)   . Gout   . Hemorrhoids   . 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  Last was Prednisone '40mg'$ ; 2 wk in 10/2012.  She also was on Prednisone bridged with Cellcept briefly but stopped due to lack of insurance.   . Normal cardiac stress test 02/2014  . Pulmonary embolism ([place]) 04/2012  . Renal insufficiency   . Serrated adenoma of colon   .  Steroid-induced diabetes ([place])   . Stroke ([place])   . Thrush, oral 05/29/2011    Patient Active Problem List   Diagnosis Date Noted  . Carpal tunnel syndrome of right wrist   . Carpal tunnel syndrome, left   . C7 radiculopathy 01/07/2016  . History of stroke   . Chronic back pain 12/23/2015  . Cerebrovascular accident (CVA) due to embolism ([place]) 12/16/2015  . Back pain 11/22/2015  . Asthma, chronic 02/16/2015  . Anxiety state 02/16/2015  . CKD (chronic kidney disease) stage 3, GFR 30-59 ml/min 04/28/2014  . Nausea with vomiting 01/25/2013  . Abnormal LFTs 01/25/2013  . Orthostatic hypotension 01/25/2013  . Unspecified constipation 12/27/2012  . Rectal bleeding 12/26/2012  . PE (pulmonary embolism) 05/03/2012  . Tachycardia 06/04/2011  . Hemorrhoids 06/04/2011  . 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 Immunosuppressed status ([place]) 06/04/2011  . Depression 06/04/2011  . Obesity 06/04/2011  . Hypertension 03/18/2011  . Idiopathic thrombocytopenic purpura ([place]) 03/18/2011    Past Surgical History:  Procedure Laterality Date  . BONE MARROW BIOPSY    . CARPAL TUNNEL RELEASE    . CARPAL TUNNEL RELEASE Left 05/20/2016   Procedure: CARPAL TUNNEL RELEASE;  Surgeon: Carole Civil, MD;  Location: [facility];  Service: Orthopedics;  Laterality: Left;  . CARPAL TUNNEL RELEASE Right 07/16/2016   Procedure: RIGHT CARPAL TUNNEL RELEASE;  Surgeon: Carole Civil, MD;  Location: [facility];  Service: Orthopedics;  Laterality: Right;  . CATARACT EXTRACTION    .  CHOLECYSTECTOMY  2008  . COLONOSCOPY WITH ESOPHAGOGASTRODUODENOSCOPY (EGD) N/A 01/04/2013   Dr. Gala Romney: esophageal plaques with +KOH, hh. Gastric antrum abnormal, bx reactive gastropathy. Anal canal hemorrhoids, colonic tics, normal TI, single polyp (sessile serrated tubular adenoma). Next TCS 12/2017  . 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 TEE WITHOUT CARDIOVERSION N/A 01/03/2016   Procedure: TRANSESOPHAGEAL  ECHOCARDIOGRAM (TEE) WITH PROPOFOL;  Surgeon: Satira Sark, MD;  Location: [facility];  Service: Cardiovascular;  Laterality: N/A;    OB History    No data available       Home Medications    Prior to Admission medications   Medication Sig Start Date End Date Taking? Authorizing Provider  benzonatate (TESSALON) 100 MG capsule Take 1 capsule (100 mg total) by mouth every 8 (eight) hours. 11/05/16   Merrily Pew, MD  doxycycline (VIBRAMYCIN) 100 MG capsule Take 1 capsule (100 mg total) by mouth 2 (two) times daily. One po bid x 7 days 11/05/16   Merrily Pew, MD  furosemide (LASIX) 20 MG tablet Take 20-40 mg by mouth 2 (two) times daily as needed for fluid. Reported on 01/15/2016 12/16/14   Historical Provider, MD  ipratropium-albuterol (DUONEB) 0.5-2.5 (3) MG/3ML SOLN Take 3 mLs by nebulization 3 (three) times daily as needed (shorntess of breath/wheezing). Patient taking differently: Take 3 mLs by nebulization 3 (three) times daily as needed (shortness of breath/wheezing).  02/18/15   Kathie Dike, MD  KLOR-CON 10 10 MEQ tablet Take 10 mEq by mouth as needed (Only takes with the furosemide). Reported on 01/15/2016 11/13/14   Historical Provider, MD  metoprolol succinate (TOPROL-XL) 50 MG 24 hr tablet Take 50 mg by mouth daily.  06/09/16   Historical Provider, MD  ondansetron (ZOFRAN) 4 MG tablet Take 1 tablet (4 mg total) by mouth every 6 (six) hours as needed for nausea or vomiting. 09/25/16   Julianne Rice, MD  polyvinyl alcohol (LIQUIFILM TEARS) 1.4 % ophthalmic solution Place 1 drop into both eyes 3 (three) times daily as needed for dry eyes.     Historical Provider, MD  PROAIR RESPICLICK 400 (90 BASE) MCG/ACT AEPB Inhale 2 puffs into the lungs every 4 (four) hours as needed (Shortness of breath).  12/27/14   Historical Provider, MD  tiZANidine (ZANAFLEX) 4 MG tablet TAKE 1 TABLET BY MOUTH EVERY 6 HOURS AS NEEDED FOR MUSCLE SPASMS 09/07/16   Carole Civil, MD    Family  History Family History  Problem Relation Age of Onset  . Cervical cancer Mother   . Lung cancer Father   . Colon polyps Father     ???  . Pneumonia Brother   . Colon cancer Paternal Grandmother 88  . AAA (abdominal aortic aneurysm) Paternal Grandmother   . Breast cancer Paternal Grandmother   . Colon cancer Paternal Grandfather 75  . Barrett's esophagus Paternal Aunt   . Stomach cancer Neg Hx   . Pancreatic cancer Neg Hx     Social History Social History  Substance Use Topics  . Smoking status: Former Smoker    Years: 0.00    Types: Cigarettes    Quit date: 02/14/2009  . Smokeless tobacco: Never Used  . Alcohol use No     Allergies   Brintellix [vortioxetine]; Yellow jacket venom; Adhesive [tape]; and Zoloft [sertraline hcl]   Review of Systems Review of Systems  Constitutional: Positive for fever.  HENT: Positive for congestion.   Respiratory: Positive for cough. Negative for shortness of breath.   Cardiovascular: Negative for chest  pain.  Gastrointestinal: Positive for nausea and vomiting (one episode).  Genitourinary: Positive for frequency. Negative for dysuria.  Musculoskeletal: Positive for myalgias.  All other systems reviewed and are negative.    Physical Exam Updated Vital Signs BP (!) 156/115   Pulse 94   Temp 98.3 F (36.8 C)   Resp 20   Ht '5\' 5"'$  (1.651 m)   Wt 188 lb 11.2 oz (85.6 kg)   LMP 08/13/2011   SpO2 100%   BMI 31.40 kg/m   Physical Exam  Constitutional: She is oriented to person, place, and time. She appears well-developed and well-nourished. No distress.  HENT:  Head: Normocephalic and atraumatic.  Eyes: Conjunctivae and EOM are normal.  Neck: Neck supple. No tracheal deviation present.  Cardiovascular: Normal rate, regular rhythm, normal heart sounds and intact distal pulses.  Exam reveals no gallop and no friction rub.   No murmur heard. Pulmonary/Chest: Effort normal and breath sounds normal. No respiratory distress. She has  no wheezes. She has no rales. She exhibits no tenderness.  Abdominal: Soft. There is no tenderness. There is no rebound and no guarding.  Musculoskeletal: Normal range of motion.  Lymphadenopathy:    She has cervical adenopathy (anterior).  Neurological: She is alert and oriented to person, place, and time.  Skin: Skin is warm and dry. There is erythema.  Erythema to upper back.  Psychiatric: She has a normal mood and affect. Her behavior is normal.  Nursing note and vitals reviewed.    ED Treatments / Results  Labs (all labs ordered are listed, but only abnormal results are displayed) Labs Reviewed  INFLUENZA PANEL BY PCR (TYPE A & B)    EKG  EKG Interpretation None       Radiology Dg Chest 2 View  Result Date: 11/05/2016 CLINICAL DATA:  Cough EXAM: CHEST  2 VIEW COMPARISON:  09/25/2016 FINDINGS: The heart size and mediastinal contours are within normal limits. Both lungs are clear. The visualized skeletal structures are unremarkable. Surgical clips in the right upper quadrant IMPRESSION: No active cardiopulmonary disease. Electronically Signed   By: Donavan Foil M.D.   On: 11/05/2016 21:58    Procedures Procedures (including critical care time)  DIAGNOSTIC STUDIES: Oxygen Saturation is 97% on RA, normal by my interpretation.    COORDINATION OF CARE: 10:25 PM Discussed treatment plan with pt at bedside and pt agreed to plan.  Medications Ordered in ED Medications  doxycycline (VIBRA-TABS) tablet 100 mg (not administered)  sodium chloride 0.9 % bolus 1,000 mL (1,000 mLs Intravenous New Bag/Given 11/05/16 2245)  acetaminophen (TYLENOL) tablet 1,000 mg (1,000 mg Oral Given 11/05/16 2239)     Initial Impression / Assessment and Plan / ED Course  I have reviewed the triage vital signs and the nursing notes.  Pertinent labs & imaging results that were available during my care of the patient were reviewed by me and considered in my medical decision making (see chart for  details).  Likely sinusitis versus pneumonia. We'll start on doxycycline. Patient stable for discharge. Heart rate improved with fluids suspect related to the fever and dehydration.  Final Clinical Impressions(s) / ED Diagnoses   Final diagnoses:  Fever, unspecified fever cause  Cough  Acute non-recurrent frontal sinusitis    New Prescriptions New Prescriptions   BENZONATATE (TESSALON) 100 MG CAPSULE    Take 1 capsule (100 mg total) by mouth every 8 (eight) hours.   DOXYCYCLINE (VIBRAMYCIN) 100 MG CAPSULE    Take 1 capsule (100 mg total)  by mouth 2 (two) times daily. One po bid x 7 days   I personally performed the services described in this documentation, which was scribed in my presence. The recorded information has been reviewed and is accurate.   Merrily Pew, MD 11/05/16 [phone]

## 2016-11-05 NOTE — ED Notes (Signed)
Patient transported to X-ray 

## 2016-11-05 NOTE — ED Notes (Signed)
ED Provider at bedside. 

## 2016-11-05 NOTE — ED Notes (Signed)
Pt reports that she has not had her blood pressure medication today due to feeling bad,

## 2016-11-30 ENCOUNTER — Telehealth: Payer: Self-pay | Admitting: Orthopedic Surgery

## 2016-11-30 NOTE — Telephone Encounter (Signed)
Tizanidine HCL 4 MG  Qty 25 Tablets  Take 1 tablet by mouth every 6 hours as needed for muscle spasms.  This is a faxed request from [facility]  We have [facility] listed for this patient.

## 2016-11-30 NOTE — Telephone Encounter (Signed)
ROUTING TO DR HARRISON FOR APPROVAL 

## 2016-12-07 ENCOUNTER — Other Ambulatory Visit: Payer: Self-pay | Admitting: Orthopedic Surgery

## 2016-12-07 ENCOUNTER — Other Ambulatory Visit: Payer: Self-pay | Admitting: *Deleted

## 2016-12-07 MED ORDER — TIZANIDINE HCL 4 MG PO TABS: 4.0000 mg | ORAL_TABLET | Freq: Four times a day (QID) | ORAL | 0 refills | Status: DC | PRN

## 2016-12-07 NOTE — Telephone Encounter (Signed)
approve

## 2016-12-20 ENCOUNTER — Other Ambulatory Visit: Payer: Self-pay | Admitting: Cardiology

## 2017-01-07 ENCOUNTER — Ambulatory Visit (HOSPITAL_COMMUNITY): Payer: Medicare HMO

## 2017-01-07 ENCOUNTER — Other Ambulatory Visit (HOSPITAL_COMMUNITY): Payer: Medicare HMO

## 2017-01-11 ENCOUNTER — Other Ambulatory Visit (HOSPITAL_COMMUNITY): Payer: Self-pay | Admitting: Oncology

## 2017-01-11 DIAGNOSIS — Z1231 Encounter for screening mammogram for malignant neoplasm of breast: Secondary | ICD-10-CM

## 2017-01-14 ENCOUNTER — Emergency Department (HOSPITAL_COMMUNITY): Payer: Medicare HMO

## 2017-01-14 ENCOUNTER — Emergency Department (HOSPITAL_COMMUNITY)
Admission: EM | Admit: 2017-01-14 | Discharge: 2017-01-14 | Disposition: A | Discharge status: Home / Self Care | Payer: Medicare HMO | Attending: Emergency Medicine | Admitting: Emergency Medicine

## 2017-01-14 ENCOUNTER — Encounter (HOSPITAL_COMMUNITY): Payer: Self-pay | Admitting: *Deleted

## 2017-01-14 DIAGNOSIS — S20211A Contusion of right front wall of thorax, initial encounter: Secondary | ICD-10-CM | POA: Diagnosis not present

## 2017-01-14 DIAGNOSIS — N183 Chronic kidney disease, stage 3 (moderate): Secondary | ICD-10-CM | POA: Diagnosis not present

## 2017-01-14 DIAGNOSIS — M25562 Pain in left knee: Secondary | ICD-10-CM | POA: Diagnosis not present

## 2017-01-14 DIAGNOSIS — Z87891 Personal history of nicotine dependence: Secondary | ICD-10-CM | POA: Diagnosis not present

## 2017-01-14 DIAGNOSIS — S299XXA Unspecified injury of thorax, initial encounter: Secondary | ICD-10-CM | POA: Diagnosis not present

## 2017-01-14 DIAGNOSIS — Y939 Activity, unspecified: Secondary | ICD-10-CM | POA: Diagnosis not present

## 2017-01-14 DIAGNOSIS — Y929 Unspecified place or not applicable: Secondary | ICD-10-CM | POA: Diagnosis not present

## 2017-01-14 DIAGNOSIS — I129 Hypertensive chronic kidney disease with stage 1 through stage 4 chronic kidney disease, or unspecified chronic kidney disease: Secondary | ICD-10-CM | POA: Diagnosis not present

## 2017-01-14 DIAGNOSIS — Z79899 Other long term (current) drug therapy: Secondary | ICD-10-CM | POA: Insufficient documentation

## 2017-01-14 DIAGNOSIS — W1839XA Other fall on same level, initial encounter: Secondary | ICD-10-CM | POA: Diagnosis not present

## 2017-01-14 DIAGNOSIS — S8992XA Unspecified injury of left lower leg, initial encounter: Secondary | ICD-10-CM | POA: Insufficient documentation

## 2017-01-14 DIAGNOSIS — Y999 Unspecified external cause status: Secondary | ICD-10-CM | POA: Diagnosis not present

## 2017-01-14 DIAGNOSIS — J45909 Unspecified asthma, uncomplicated: Secondary | ICD-10-CM | POA: Diagnosis not present

## 2017-01-14 MED ORDER — HYDROCODONE-ACETAMINOPHEN 5-325 MG PO TABS
1.0000 | ORAL_TABLET | Freq: Once | ORAL | Status: AC
Start: 2017-01-14 — End: 2017-01-14
  Administered 2017-01-14: 1 via ORAL
  Filled 2017-01-14: qty 1

## 2017-01-14 MED ORDER — HYDROCODONE-ACETAMINOPHEN 5-325 MG PO TABS: ORAL_TABLET | ORAL | 0 refills | Status: DC

## 2017-01-14 NOTE — ED Notes (Signed)
Pt made aware to return if symptoms worsen or if any life threatening symptoms occur.   

## 2017-01-14 NOTE — Discharge Instructions (Signed)
Wear the knee sleeve as needed for support, but do not wear it continuously.  Ice on/off to your knee.  Try taking aleve twice a day with food.  Follow-up with your primary doctor or with Dr. Aline Brochure in one week if the knee pain is not improving

## 2017-01-14 NOTE — ED Triage Notes (Signed)
Pt c/o right sided rib pain and left knee pain after fall last night from tripping over a baby gate. Denies LOC. Pt ambulatory in triage.

## 2017-01-15 NOTE — ED Provider Notes (Signed)
[facility] DEPT Provider Note   CSN: [id number] Arrival date & time: 01/14/17  1047     History   Chief Complaint Chief Complaint  Patient presents with  . Fall    HPI NAYELIS BONITO is a 51 y.o. female.  HPI  IOLA TURRI is a 51 y.o. female who presents to the Emergency Department complaining of right rib pain and left knee pain secondary to a mechanical fall that on the evening prior to arrival.  She states that she was trying to step over a baby gate and fell over the gate.  She describes pain to the lateral right ribs that is worse with deep breathing, movement and coughing.  Pain improves when at rest.  Also complains of left knee pain with weight bearing.  She denies neck or back pain, head injury and LOC.  Also denies abdominal pain, shortness of breath.    Past Medical History:  Diagnosis Date  . Allergy   . Arthritis   . Asthma   . Candida esophagitis ([place])   . Chronic chest pain   . Depression   . Essential hypertension, benign   . GERD (gastroesophageal reflux disease)   . Gout   . Hemorrhoids   . 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  Last was Prednisone '40mg'$ ; 2 wk in 10/2012.  She also was on Prednisone bridged with Cellcept briefly but stopped due to lack of insurance.   . Normal cardiac stress test 02/2014  . Pulmonary embolism ([place]) 04/2012  . Renal insufficiency   . Serrated adenoma of colon   . Steroid-induced diabetes ([place])   . Stroke ([place])   . Thrush, oral 05/29/2011    Patient Active Problem List   Diagnosis Date Noted  . Carpal tunnel syndrome of right wrist   . Carpal tunnel syndrome, left   . C7 radiculopathy 01/07/2016  . History of stroke   . Chronic back pain 12/23/2015  . Cerebrovascular accident (CVA) due to embolism ([place]) 12/16/2015  . Back pain 11/22/2015  . Asthma, chronic 02/16/2015  . Anxiety state 02/16/2015  .  CKD (chronic kidney disease) stage 3, GFR 30-59 ml/min 04/28/2014  . Nausea with vomiting 01/25/2013  . Abnormal LFTs 01/25/2013  . Orthostatic hypotension 01/25/2013  . Unspecified constipation 12/27/2012  . Rectal bleeding 12/26/2012  . PE (pulmonary embolism) 05/03/2012  . Tachycardia 06/04/2011  . Hemorrhoids 06/04/2011  . 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 Immunosuppressed status ([place]) 06/04/2011  . Depression 06/04/2011  . Obesity 06/04/2011  . Hypertension 03/18/2011  . Idiopathic thrombocytopenic purpura ([place]) 03/18/2011    Past Surgical History:  Procedure Laterality Date  . BONE MARROW BIOPSY    . CARPAL TUNNEL RELEASE    . CARPAL TUNNEL RELEASE Left 05/20/2016   Procedure: CARPAL TUNNEL RELEASE;  Surgeon: Carole Civil, MD;  Location: [facility];  Service: Orthopedics;  Laterality: Left;  . CARPAL TUNNEL RELEASE Right 07/16/2016   Procedure: RIGHT CARPAL TUNNEL RELEASE;  Surgeon: Carole Civil, MD;  Location: [facility];  Service: Orthopedics;  Laterality: Right;  . CATARACT EXTRACTION    . CHOLECYSTECTOMY  2008  . COLONOSCOPY WITH ESOPHAGOGASTRODUODENOSCOPY (EGD) N/A 01/04/2013   Dr. Gala Romney: esophageal plaques with +KOH, hh. Gastric antrum abnormal, bx reactive gastropathy. Anal canal hemorrhoids, colonic tics, normal TI, single polyp (sessile serrated tubular adenoma). Next TCS 12/2017  . 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  TEE WITHOUT CARDIOVERSION N/A 01/03/2016   Procedure: TRANSESOPHAGEAL ECHOCARDIOGRAM (TEE) WITH PROPOFOL;  Surgeon: Satira Sark, MD;  Location: [facility];  Service: Cardiovascular;  Laterality: N/A;    OB History    No data available       Home Medications    Prior to Admission medications   Medication Sig Start Date End Date Taking? Authorizing Provider  benzonatate (TESSALON) 100 MG capsule Take 1 capsule (100 mg total) by mouth every 8 (eight) hours. 11/05/16   Merrily Pew, MD  doxycycline  (VIBRAMYCIN) 100 MG capsule Take 1 capsule (100 mg total) by mouth 2 (two) times daily. One po bid x 7 days 11/05/16   Merrily Pew, MD  furosemide (LASIX) 20 MG tablet Take 20-40 mg by mouth 2 (two) times daily as needed for fluid. Reported on 01/15/2016 12/16/14   Historical Provider, MD  HYDROcodone-acetaminophen (NORCO/VICODIN) 5-325 MG tablet Take one tab po q 4-6 hrs prn pain 01/14/17   Chloee Tena, PA-C  ipratropium-albuterol (DUONEB) 0.5-2.5 (3) MG/3ML SOLN Take 3 mLs by nebulization 3 (three) times daily as needed (shorntess of breath/wheezing). Patient taking differently: Take 3 mLs by nebulization 3 (three) times daily as needed (shortness of breath/wheezing).  02/18/15   Kathie Dike, MD  KLOR-CON 10 10 MEQ tablet Take 10 mEq by mouth as needed (Only takes with the furosemide). Reported on 01/15/2016 11/13/14   Historical Provider, MD  metoprolol succinate (TOPROL-XL) 50 MG 24 hr tablet TAKE 1 TABLET BY MOUTH EVERY DAY 12/21/16   Satira Sark, MD  ondansetron (ZOFRAN) 4 MG tablet Take 1 tablet (4 mg total) by mouth every 6 (six) hours as needed for nausea or vomiting. 09/25/16   Julianne Rice, MD  polyvinyl alcohol (LIQUIFILM TEARS) 1.4 % ophthalmic solution Place 1 drop into both eyes 3 (three) times daily as needed for dry eyes.     Historical Provider, MD  PROAIR RESPICLICK 950 (90 BASE) MCG/ACT AEPB Inhale 2 puffs into the lungs every 4 (four) hours as needed (Shortness of breath).  12/27/14   Historical Provider, MD  tiZANidine (ZANAFLEX) 4 MG tablet TAKE 1 TABLET(4 MG) BY MOUTH EVERY 6 HOURS AS NEEDED FOR MUSCLE SPASMS 12/07/16   Carole Civil, MD    Family History Family History  Problem Relation Age of Onset  . Cervical cancer Mother   . Lung cancer Father   . Colon polyps Father     ???  . Pneumonia Brother   . Colon cancer Paternal Grandmother 88  . AAA (abdominal aortic aneurysm) Paternal Grandmother   . Breast cancer Paternal Grandmother   . Colon cancer Paternal  Grandfather 54  . Barrett's esophagus Paternal Aunt   . Stomach cancer Neg Hx   . Pancreatic cancer Neg Hx     Social History Social History  Substance Use Topics  . Smoking status: Former Smoker    Years: 0.00    Types: Cigarettes    Quit date: 02/14/2009  . Smokeless tobacco: Never Used  . Alcohol use No     Allergies   Brintellix [vortioxetine]; Yellow jacket venom; Adhesive [tape]; and Zoloft [sertraline hcl]   Review of Systems Review of Systems  Constitutional: Negative for chills and fever.  Respiratory: Negative for chest tightness and shortness of breath.   Cardiovascular: Positive for chest pain (right chest wall pain).  Gastrointestinal: Negative for abdominal pain, nausea and vomiting.  Genitourinary: Negative for difficulty urinating, dysuria, flank pain and hematuria.  Musculoskeletal: Positive for arthralgias (left  knee pain). Negative for joint swelling.  Skin: Negative for color change and wound.  Neurological: Negative for dizziness, syncope, numbness and headaches.  All other systems reviewed and are negative.    Physical Exam Updated Vital Signs BP 108/76 (BP Location: Right Arm)   Pulse 62   Temp 98.5 F (36.9 C) (Oral)   Resp 18   Ht '5\' 5"'$  (1.651 m)   Wt 86 kg   LMP 08/13/2011   SpO2 97%   BMI 31.55 kg/m   Physical Exam  Constitutional: She is oriented to person, place, and time. She appears well-developed and well-nourished. No distress.  HENT:  Mouth/Throat: Oropharynx is clear and moist.  Eyes: Conjunctivae and EOM are normal. Pupils are equal, round, and reactive to light.  Neck: Normal range of motion, full passive range of motion without pain and phonation normal. Neck supple.  Cardiovascular: Normal rate, regular rhythm and intact distal pulses.   Pulmonary/Chest: Effort normal and breath sounds normal. No respiratory distress. She exhibits tenderness (tenderness to palpation of the lateral right ribs.  no edema or crepitus. no  splinting.).  Abdominal: Soft. She exhibits no distension and no mass. There is no tenderness. There is no guarding.  Musculoskeletal: She exhibits tenderness.  Tenderness on ROM of the anterior left knee.  No effusion or edema.  No bony deformity.   Neurological: She is alert and oriented to person, place, and time. No sensory deficit.  Skin: Skin is warm. Capillary refill takes less than 2 seconds.  Nursing note and vitals reviewed.    ED Treatments / Results  Labs (all labs ordered are listed, but only abnormal results are displayed) Labs Reviewed - No data to display  EKG  EKG Interpretation None       Radiology Dg Ribs Unilateral W/chest Right  Result Date: 01/14/2017 CLINICAL DATA:  [place] last night.  Pain under right breast. EXAM: RIGHT RIBS AND CHEST - 3+ VIEW COMPARISON:  Chest x-ray 11/05/2016 FINDINGS: The lungs are clear wiithout focal pneumonia, edema, pneumothorax or pleural effusion. The cardiopericardial silhouette is within normal limits for size. Oblique views of the right ribs show no evidence for a displaced acute right-sided rib fracture. IMPRESSION: Negative. Electronically Signed   By: Misty Stanley M.D.   On: 01/14/2017 12:40   Dg Knee Complete 4 Views Left  Result Date: 01/14/2017 CLINICAL DATA:  LEFT knee pain after fall last night. tripping on baby gate EXAM: LEFT KNEE - COMPLETE 4+ VIEW COMPARISON:  None. FINDINGS: No fracture of the proximal tibia or distal femur. Patella is normal. No joint effusion. IMPRESSION: No fracture or dislocation. Electronically Signed   By: Suzy Bouchard M.D.   On: 01/14/2017 11:59    Procedures Procedures (including critical care time)  Medications Ordered in ED Medications  HYDROcodone-acetaminophen (NORCO/VICODIN) 5-325 MG per tablet 1 tablet (1 tablet Oral Given 01/14/17 1240)     Initial Impression / Assessment and Plan / ED Course  I have reviewed the triage vital signs and the nursing notes.  Pertinent labs &  imaging results that were available during my care of the patient were reviewed by me and considered in my medical decision making (see chart for details).     Tenderness of the right chest wall after mechanical fall.  XR neg for fx.  Discussed possible occult fx.  Vitals stable.  Pt appears stable for d/c.  Return precautions discussed.    Final Clinical Impressions(s) / ED Diagnoses   Final diagnoses:  Contusion  of rib on right side, initial encounter  Injury of left knee, initial encounter    New Prescriptions Discharge Medication List as of 01/14/2017  1:12 PM    START taking these medications   Details  HYDROcodone-acetaminophen (NORCO/VICODIN) 5-325 MG tablet Take one tab po q 4-6 hrs prn pain, Print         Kem Parkinson, PA-C 01/15/17 2147    Francine Graven, DO 01/17/17 1659

## 2017-02-01 ENCOUNTER — Other Ambulatory Visit (HOSPITAL_COMMUNITY): Payer: Medicare HMO

## 2017-02-01 ENCOUNTER — Encounter (HOSPITAL_COMMUNITY): Payer: Medicare HMO

## 2017-02-10 DIAGNOSIS — M5136 Other intervertebral disc degeneration, lumbar region: Secondary | ICD-10-CM | POA: Diagnosis not present

## 2017-02-10 DIAGNOSIS — M170 Bilateral primary osteoarthritis of knee: Secondary | ICD-10-CM | POA: Diagnosis not present

## 2017-02-10 DIAGNOSIS — M5416 Radiculopathy, lumbar region: Secondary | ICD-10-CM | POA: Diagnosis not present

## 2017-02-25 ENCOUNTER — Encounter (HOSPITAL_COMMUNITY): Payer: Medicare HMO | Attending: Oncology

## 2017-02-25 ENCOUNTER — Encounter (HOSPITAL_COMMUNITY): Payer: Self-pay

## 2017-02-25 ENCOUNTER — Encounter (HOSPITAL_BASED_OUTPATIENT_CLINIC_OR_DEPARTMENT_OTHER): Payer: Medicare HMO | Admitting: Oncology

## 2017-02-25 VITALS — BP 100/75 | HR 72 | Temp 98.7°F | Resp 18 | Wt 186.4 lb

## 2017-02-25 DIAGNOSIS — I219 Acute myocardial infarction, unspecified: Secondary | ICD-10-CM | POA: Insufficient documentation

## 2017-02-25 DIAGNOSIS — D693 Immune thrombocytopenic purpura: Secondary | ICD-10-CM | POA: Diagnosis not present

## 2017-02-25 DIAGNOSIS — D696 Thrombocytopenia, unspecified: Secondary | ICD-10-CM

## 2017-02-25 DIAGNOSIS — E119 Type 2 diabetes mellitus without complications: Secondary | ICD-10-CM | POA: Insufficient documentation

## 2017-02-25 LAB — COMPREHENSIVE METABOLIC PANEL
ALK PHOS: 96 U/L (ref 38–126)
ALT: 19 U/L (ref 14–54)
ANION GAP: 8 (ref 5–15)
AST: 30 U/L (ref 15–41)
Albumin: 4 g/dL (ref 3.5–5.0)
BUN: 22 mg/dL — ABNORMAL HIGH (ref 6–20)
CALCIUM: 9.4 mg/dL (ref 8.9–10.3)
CO2: 28 mmol/L (ref 22–32)
CREATININE: 1.95 mg/dL — AB (ref 0.44–1.00)
Chloride: 105 mmol/L (ref 101–111)
GFR, EST AFRICAN AMERICAN: 33 mL/min — AB (ref >60)
GFR, EST NON AFRICAN AMERICAN: 29 mL/min — AB (ref >60)
Glucose, Bld: 109 mg/dL — ABNORMAL HIGH (ref 65–99)
Potassium: 4.3 mmol/L (ref 3.5–5.1)
SODIUM: 141 mmol/L (ref 135–145)
TOTAL PROTEIN: 7.4 g/dL (ref 6.5–8.1)
Total Bilirubin: 0.6 mg/dL (ref 0.3–1.2)

## 2017-02-25 LAB — CBC WITH DIFFERENTIAL/PLATELET
Basophils Absolute: 0 10*3/uL (ref 0.0–0.1)
Basophils Relative: 1 %
EOS ABS: 0.2 10*3/uL (ref 0.0–0.7)
Eosinophils Relative: 4 %
HCT: 43.1 % (ref 36.0–46.0)
HEMOGLOBIN: 14.1 g/dL (ref 12.0–15.0)
Lymphocytes Relative: 46 %
Lymphs Abs: 2.8 10*3/uL (ref 0.7–4.0)
MCH: 29.1 pg (ref 26.0–34.0)
MCHC: 32.7 g/dL (ref 30.0–36.0)
MCV: 89 fL (ref 78.0–100.0)
MONOS PCT: 8 %
Monocytes Absolute: 0.5 10*3/uL (ref 0.1–1.0)
NEUTROS PCT: 41 %
Neutro Abs: 2.4 10*3/uL (ref 1.7–7.7)
Platelets: 141 10*3/uL — ABNORMAL LOW (ref 150–400)
RBC: 4.84 MIL/uL (ref 3.87–5.11)
RDW: 14.2 % (ref 11.5–15.5)
WBC: 6 10*3/uL (ref 4.0–10.5)

## 2017-02-25 NOTE — Progress Notes (Signed)
Summer Cook [place] [address]    DIAGNOSIS:  ITP with platelet count of 18K on 12/2012 diagnosed in 2011   Splenectomy   History of iron deficiency   DEXA with normal bone density 05/2014   Acute bi- hemispheric stroke, likely cardioembolic  CURRENT THERAPY: Observation  INTERVAL HISTORY: Summer Cook 51 y.o. female returns for follow-up of ITP.   Patient presented for follow-up today. Patient states that for the past 3 months she's been having persistent nausea with vomiting at times. She states that she's also been having diarrhea. She has not seen her gastroenterologist, Dr. Oneida Alar for this yet. She also complains of fatigue. Denies any issues with bleeding or bruising. Otherwise ROS was negative.    MEDICAL HISTORY: Past Medical History:  Diagnosis Date  . Allergy   . Arthritis   . Asthma   . Candida esophagitis ([place])   . Chronic chest pain   . Depression   . Essential hypertension, benign   . GERD (gastroesophageal reflux disease)   . Gout   . Hemorrhoids   . 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  Last was Prednisone '40mg'$ ; 2 wk in 10/2012.  She also was on Prednisone bridged with Cellcept briefly but stopped due to lack of insurance.   . Normal cardiac stress test 02/2014  . Pulmonary embolism ([place]) 04/2012  . Renal insufficiency   . Serrated adenoma of colon   . Steroid-induced diabetes ([place])   . Stroke ([place])   . Thrush, oral 05/29/2011    has Hypertension; Idiopathic thrombocytopenic purpura ([place]); Tachycardia; Hemorrhoids; Steroid-induced diabetes ([place]); Post-splenectomy; Immunosuppressed status ([place]); Depression; Obesity; PE (pulmonary embolism); Rectal bleeding; Unspecified constipation; Nausea with vomiting; Abnormal LFTs; Orthostatic hypotension; CKD (chronic kidney disease) stage 3, GFR 30-59 ml/min; Asthma,  chronic; Anxiety state; Back pain; Cerebrovascular accident (CVA) due to embolism ([place]); Chronic back pain; History of stroke; C7 radiculopathy; Carpal tunnel syndrome, left; Carpal tunnel syndrome of right wrist; Bilateral lower extremity pain; Diabetes mellitus type 2, uncomplicated ([place]); Myocardial infarction ([place]); and Thrombocytopenia ([place]) on her problem list.     is allergic to brintellix [vortioxetine]; yellow jacket venom; adhesive [tape]; and zoloft [sertraline hcl].  [name] had no medications administered during this visit.  SURGICAL HISTORY: Past Surgical History:  Procedure Laterality Date  . BONE MARROW BIOPSY    . CARPAL TUNNEL RELEASE    . CARPAL TUNNEL RELEASE Left 05/20/2016   Procedure: CARPAL TUNNEL RELEASE;  Surgeon: Carole Civil, Cook;  Location: [facility];  Service: Orthopedics;  Laterality: Left;  . CARPAL TUNNEL RELEASE Right 07/16/2016   Procedure: RIGHT CARPAL TUNNEL RELEASE;  Surgeon: Carole Civil, Cook;  Location: [facility];  Service: Orthopedics;  Laterality: Right;  . CATARACT EXTRACTION    . CHOLECYSTECTOMY  2008  . COLONOSCOPY WITH ESOPHAGOGASTRODUODENOSCOPY (EGD) N/A 01/04/2013   Dr. Gala Romney: esophageal plaques with +KOH, hh. Gastric antrum abnormal, bx reactive gastropathy. Anal canal hemorrhoids, colonic tics, normal TI, single polyp (sessile serrated tubular adenoma). Next TCS 12/2017  . 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 TEE WITHOUT CARDIOVERSION N/A 01/03/2016   Procedure: TRANSESOPHAGEAL ECHOCARDIOGRAM (TEE) WITH PROPOFOL;  Surgeon: Satira Sark, Cook;  Location: [facility];  Service: Cardiovascular;  Laterality: N/A;    SOCIAL HISTORY: Social History   Social History  . Marital status: Divorced    Spouse name: N/A  . Number  of children: 1  . Years of education: N/A   Occupational History  .  Unemployed    was a Educational psychologist; last worked 2012.    Social History Main Topics  . Smoking status: Former Smoker    Years: 0.00    Types: Cigarettes     Quit date: 02/14/2009  . Smokeless tobacco: Never Used  . Alcohol use No  . Drug use: No  . Sexual activity: No   Other Topics Concern  . Not on file   Social History Narrative   Lives with her daughter and aunt in a one story home.  Has 1 daughter.  On disability.  Did work as a Optometrist and bus Geophysicist/field seismologist.  Education: some college.     FAMILY HISTORY: Family History  Problem Relation Age of Onset  . Cervical cancer Mother   . Lung cancer Father   . Colon polyps Father        ???  . Pneumonia Brother   . Colon cancer Paternal Grandmother 88  . AAA (abdominal aortic aneurysm) Paternal Grandmother   . Breast cancer Paternal Grandmother   . Colon cancer Paternal Grandfather 51  . Barrett's esophagus Paternal Aunt   . Stomach cancer Neg Hx   . Pancreatic cancer Neg Hx     Review of Systems  Constitutional: Positive for weight loss (10 lbs since last visit).  HENT: Negative for congestion.   Eyes: Negative.   Respiratory: Negative.   Cardiovascular: Negative.   Gastrointestinal: Positive for diarrhea, nausea and vomiting.  Genitourinary: Negative.   Musculoskeletal: Negative.   Skin: Negative.   Neurological: Negative.   Endo/Heme/Allergies: Negative.   Psychiatric/Behavioral: Negative for depression (has not been taking her antidepressants).  All other systems reviewed and are negative.  14 point review of systems was performed and is negative except as detailed under history of present illness and above   PHYSICAL EXAMINATION  ECOG PERFORMANCE STATUS: 1 - Symptomatic but completely ambulatory  Vitals:   02/25/17 1511  BP: 100/75  Pulse: 72  Resp: 18  Temp: 98.7 F (37.1 C)     Physical Exam  Constitutional: She is oriented to person, place, and time and well-developed, well-nourished, and in no distress. No distress.  Pt was able to get on exam table without assistance.   HENT:  Head: Normocephalic and atraumatic.  Nose: Nose normal.    Mouth/Throat: Oropharynx is clear and moist. No oropharyngeal exudate.  Eyes: Conjunctivae and EOM are normal. Pupils are equal, round, and reactive to light. Right eye exhibits no discharge. Left eye exhibits no discharge. No scleral icterus.  Neck: Normal range of motion. Neck supple. No JVD present. No tracheal deviation present. No thyromegaly present.  Cardiovascular: Normal rate, regular rhythm and normal heart sounds.  Exam reveals no gallop and no friction rub.   No murmur heard. Pulmonary/Chest: Effort normal and breath sounds normal. No respiratory distress. She has no wheezes. She has no rales.  Abdominal: Soft. Bowel sounds are normal. She exhibits no distension and no mass. There is no tenderness. There is no rebound and no guarding.  Musculoskeletal: Normal range of motion. She exhibits no edema or tenderness.  Lymphadenopathy:    She has no cervical adenopathy.  Neurological: She is alert and oriented to person, place, and time. She has normal reflexes. No cranial nerve deficit. Gait normal. Coordination normal.  Skin: Skin is warm and dry. No rash noted. No erythema. No pallor.  Psychiatric: Mood, memory, affect and judgment normal.  Nursing note and vitals reviewed.   LABORATORY DATA: I have reviewed the data below as listed.  Results for GERALDEAN, WALEN (MRN [id number]) as of 02/25/2017 15:07  Ref. Range 02/25/2017 14:28  COMPREHENSIVE METABOLIC PANEL Unknown Rpt (A)  Sodium Latest Ref Range: 135 - 145 mmol/L 141  Potassium Latest Ref Range: 3.5 - 5.1 mmol/L 4.3  Chloride Latest Ref Range: 101 - 111 mmol/L 105  CO2 Latest Ref Range: 22 - 32 mmol/L 28  Glucose Latest Ref Range: 65 - 99 mg/dL 109 (H)  BUN Latest Ref Range: 6 - 20 mg/dL 22 (H)  Creatinine Latest Ref Range: 0.44 - 1.00 mg/dL 1.95 (H)  Calcium Latest Ref Range: 8.9 - 10.3 mg/dL 9.4  Anion gap Latest Ref Range: 5 - 15  8  Alkaline Phosphatase Latest Ref Range: 38 - 126 U/L 96  Albumin Latest Ref Range: 3.5  - 5.0 g/dL 4.0  AST Latest Ref Range: 15 - 41 U/L 30  ALT Latest Ref Range: 14 - 54 U/L 19  Total Protein Latest Ref Range: 6.5 - 8.1 g/dL 7.4  Total Bilirubin Latest Ref Range: 0.3 - 1.2 mg/dL 0.6  EGFR (African American) Latest Ref Range: >60 mL/min 33 (L)  EGFR (Non-African Amer.) Latest Ref Range: >60 mL/min 29 (L)  WBC Latest Ref Range: 4.0 - 10.5 K/uL 6.0  RBC Latest Ref Range: 3.87 - 5.11 MIL/uL 4.84  Hemoglobin Latest Ref Range: 12.0 - 15.0 g/dL 14.1  HCT Latest Ref Range: 36.0 - 46.0 % 43.1  MCV Latest Ref Range: 78.0 - 100.0 fL 89.0  MCH Latest Ref Range: 26.0 - 34.0 pg 29.1  MCHC Latest Ref Range: 30.0 - 36.0 g/dL 32.7  RDW Latest Ref Range: 11.5 - 15.5 % 14.2  Platelets Latest Ref Range: 150 - 400 K/uL 141 (L)   Radiographic Studies I have reviewed the below radiographic studies and agree with their findings.  Study Result   CLINICAL DATA:  Syncope.  History of PE 2 years ago.  EXAM: NUCLEAR MEDICINE VENTILATION - PERFUSION LUNG SCAN  TECHNIQUE: Ventilation images were obtained in multiple projections using inhaled aerosol Tc-66mDTPA. Perfusion images were obtained in multiple projections after intravenous injection of Tc-965mAA.  RADIOPHARMACEUTICALS:  30.5 mCi Technetium-9938mPA aerosol inhalation and 4.2 mCi Technetium-55m20m IV  COMPARISON:  Chest x-ray from earlier today  FINDINGS: Ventilation: No focal ventilation defect.  Perfusion: No wedge shaped peripheral perfusion defects to suggest acute pulmonary embolism.  IMPRESSION: Negative for pulmonary embolism.   Electronically Signed   By: JonaMonte Fantasia.   On: 06/17/2016 15:32     ASSESSMENT and THERAPY PLAN:   ITP  50 y24r old female with ITP diagnosed in 2011. She failed splenectomy. She has been off all therapy for some time.   PLAN: No evidence of thrombocytopenia. Platelets have been WNL. Reviewed labs with the patient. Recommended for her to see Dr. FielOneida AlarP for  her ongoing GI issues. RTC in 6 months for follow up with repeat labs.  Orders Placed This Encounter  Procedures  . CBC with Differential    Standing Status:   Future    Standing Expiration Date:   02/25/2018  . Comprehensive metabolic panel    Standing Status:   Future    Standing Expiration Date:   02/25/2018  . Iron and TIBC    Standing Status:   Future    Standing Expiration Date:   02/25/2018  . Ferritin    Standing Status:   Future  Standing Expiration Date:   02/25/2018

## 2017-02-25 NOTE — Patient Instructions (Signed)
[facility] at [facility] Discharge Instructions  RECOMMENDATIONS MADE BY THE CONSULTANT AND ANY TEST RESULTS WILL BE SENT TO YOUR REFERRING PHYSICIAN.  You were seen today by Dr. Louise Zhou    Thank you for choosing [facility] at [facility] to provide your oncology and hematology care.  To afford each patient quality time with our provider, please arrive at least 15 minutes before your scheduled appointment time.    If you have a lab appointment with the Cancer Center please come in thru the  Main Entrance and check in at the main information desk  You need to re-schedule your appointment should you arrive 10 or more minutes late.  We strive to give you quality time with our providers, and arriving late affects you and other patients whose appointments are after yours.  Also, if you no show three or more times for appointments you may be dismissed from the clinic at the providers discretion.     Again, thank you for choosing  Cancer Center.  Our hope is that these requests will decrease the amount of time that you wait before being seen by our physicians.       _____________________________________________________________  Should you have questions after your visit to  Cancer Center, please contact our office at [phone] between the hours of 8:30 a.m. and 4:30 p.m.  Voicemails left after 4:30 p.m. will not be returned until the following business day.  For prescription refill requests, have your pharmacy contact our office.       Resources For Cancer Patients and their Caregivers ? American Cancer Society: Can assist with transportation, wigs, general needs, runs Look Good Feel Better.        1-888-227-6333 ? Cancer Care: Provides financial assistance, online support groups, medication/co-pay assistance.  1-800-813-HOPE (4673) ? [facility] Assists [place] cancer patients and their  families through emotional , educational and financial support.  336-427-4357 ? [place] DSS Where to apply for food stamps, Medicaid and utility assistance. 336-342-1394 ? [facility]: Transportation to medical appointments. 336-347-2287 ? Social Security Administration: May apply for disability if have a Stage IV cancer. 336-342-7796 1-800-772-1213 ? [place] Aging, Disability and Transit Services: Assists with nutrition, care and transit needs. 336-349-2343  Cancer Center Support Programs: @10RELATIVEDAYS@ > Cancer Support Group  2nd Tuesday of the month 1pm-2pm, Journey Room  > Creative Journey  3rd Tuesday of the month 1130am-1pm, Journey Room  > Look Good Feel Better  1st Wednesday of the month 10am-12 noon, Journey Room (Call American Cancer Society to register 1-800-395-5775)    

## 2017-02-28 ENCOUNTER — Emergency Department (HOSPITAL_COMMUNITY): Payer: Medicare HMO

## 2017-02-28 ENCOUNTER — Emergency Department (HOSPITAL_COMMUNITY)
Admission: EM | Admit: 2017-02-28 | Discharge: 2017-02-28 | Disposition: A | Discharge status: Home / Self Care | Payer: Medicare HMO | Attending: Emergency Medicine | Admitting: Emergency Medicine

## 2017-02-28 ENCOUNTER — Encounter (HOSPITAL_COMMUNITY): Payer: Self-pay | Admitting: Emergency Medicine

## 2017-02-28 DIAGNOSIS — Y999 Unspecified external cause status: Secondary | ICD-10-CM | POA: Insufficient documentation

## 2017-02-28 DIAGNOSIS — W010XXA Fall on same level from slipping, tripping and stumbling without subsequent striking against object, initial encounter: Secondary | ICD-10-CM | POA: Diagnosis not present

## 2017-02-28 DIAGNOSIS — S8002XA Contusion of left knee, initial encounter: Secondary | ICD-10-CM | POA: Insufficient documentation

## 2017-02-28 DIAGNOSIS — Z79899 Other long term (current) drug therapy: Secondary | ICD-10-CM | POA: Diagnosis not present

## 2017-02-28 DIAGNOSIS — S8990XA Unspecified injury of unspecified lower leg, initial encounter: Secondary | ICD-10-CM

## 2017-02-28 DIAGNOSIS — Y939 Activity, unspecified: Secondary | ICD-10-CM | POA: Insufficient documentation

## 2017-02-28 DIAGNOSIS — N183 Chronic kidney disease, stage 3 (moderate): Secondary | ICD-10-CM | POA: Diagnosis not present

## 2017-02-28 DIAGNOSIS — I129 Hypertensive chronic kidney disease with stage 1 through stage 4 chronic kidney disease, or unspecified chronic kidney disease: Secondary | ICD-10-CM | POA: Diagnosis not present

## 2017-02-28 DIAGNOSIS — Z87891 Personal history of nicotine dependence: Secondary | ICD-10-CM | POA: Insufficient documentation

## 2017-02-28 DIAGNOSIS — Y929 Unspecified place or not applicable: Secondary | ICD-10-CM | POA: Insufficient documentation

## 2017-02-28 DIAGNOSIS — S8992XA Unspecified injury of left lower leg, initial encounter: Secondary | ICD-10-CM | POA: Diagnosis not present

## 2017-02-28 DIAGNOSIS — M25562 Pain in left knee: Secondary | ICD-10-CM | POA: Diagnosis not present

## 2017-02-28 DIAGNOSIS — J45909 Unspecified asthma, uncomplicated: Secondary | ICD-10-CM | POA: Insufficient documentation

## 2017-02-28 MED ORDER — ONDANSETRON HCL 4 MG PO TABS
4.0000 mg | ORAL_TABLET | Freq: Once | ORAL | Status: AC
  Administered 2017-02-28: 4 mg via ORAL
  Filled 2017-02-28: qty 1

## 2017-02-28 MED ORDER — TRAMADOL HCL 50 MG PO TABS
100.0000 mg | ORAL_TABLET | Freq: Once | ORAL | Status: AC
  Administered 2017-02-28: 100 mg via ORAL
  Filled 2017-02-28: qty 2

## 2017-02-28 NOTE — ED Triage Notes (Signed)
Pt tripped and fell this am. C/o left knee pain. Redness noted. Nad.

## 2017-02-28 NOTE — Discharge Instructions (Signed)
Your vital signs within normal limits. The x-ray of your knee shows arthritis present, but no fracture, and no dislocation. Please use the knee immobilizer over the next 5 days when you are up and about. Please apply ice over the next 4 or 5 days. Please see Dr. Sharlet Salina for additional follow-up and management if not improving.

## 2017-02-28 NOTE — ED Provider Notes (Signed)
[facility] DEPT Provider Note   CSN: [id number] Arrival date & time: 02/28/17  1023     History   Chief Complaint Chief Complaint  Patient presents with  . Knee Pain    HPI Summer Cook is a 51 y.o. female.  Patient is a 51 year old female who presents to the emergency department with a complaint of left knee pain.  The patient states that earlier this morning she slipped on a piece of paper and the right leg went in one direction and the left knee went under her. She states that she's had pain and attempting to apply weight since that time. She noticed initial bruising. She states the bruising has improved but she still has some puffiness and some discomfort of the knee. She denies being on any anticoagulation medications. She's not had any previous operations or procedures involving the knee.      Past Medical History:  Diagnosis Date  . Allergy   . Arthritis   . Asthma   . Candida esophagitis ([place])   . Chronic chest pain   . Depression   . Essential hypertension, benign   . GERD (gastroesophageal reflux disease)   . Gout   . Hemorrhoids   . 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  Last was Prednisone '40mg'$ ; 2 wk in 10/2012.  She also was on Prednisone bridged with Cellcept briefly but stopped due to lack of insurance.   . Normal cardiac stress test 02/2014  . Pulmonary embolism ([place]) 04/2012  . Renal insufficiency   . Serrated adenoma of colon   . Steroid-induced diabetes ([place])   . Stroke ([place])   . Thrush, oral 05/29/2011    Patient Active Problem List   Diagnosis Date Noted  . Diabetes mellitus type 2, uncomplicated ([place]) 95/63/8756  . Myocardial infarction ([place]) 02/25/2017  . Carpal tunnel syndrome of right wrist   . Carpal tunnel syndrome, left   . C7 radiculopathy 01/07/2016  . History of stroke   . Chronic back pain 12/23/2015  .  Cerebrovascular accident (CVA) due to embolism ([place]) 12/16/2015  . Back pain 11/22/2015  . Asthma, chronic 02/16/2015  . Anxiety state 02/16/2015  . CKD (chronic kidney disease) stage 3, GFR 30-59 ml/min 04/28/2014  . Nausea with vomiting 01/25/2013  . Abnormal LFTs 01/25/2013  . Orthostatic hypotension 01/25/2013  . Unspecified constipation 12/27/2012  . Rectal bleeding 12/26/2012  . PE (pulmonary embolism) 05/03/2012  . Bilateral lower extremity pain 04/12/2012  . Thrombocytopenia ([place]) 04/12/2012  . Tachycardia 06/04/2011  . Hemorrhoids 06/04/2011  . Steroid-induced diabetes ([place]) 06/04/2011  . 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 Immunosuppressed status ([place]) 06/04/2011  . Depression 06/04/2011  . Obesity 06/04/2011  . Hypertension 03/18/2011  . Idiopathic thrombocytopenic purpura ([place]) 03/18/2011    Past Surgical History:  Procedure Laterality Date  . BONE MARROW BIOPSY    . CARPAL TUNNEL RELEASE    . CARPAL TUNNEL RELEASE Left 05/20/2016   Procedure: CARPAL TUNNEL RELEASE;  Surgeon: Carole Civil, MD;  Location: [facility];  Service: Orthopedics;  Laterality: Left;  . CARPAL TUNNEL RELEASE Right 07/16/2016   Procedure: RIGHT CARPAL TUNNEL RELEASE;  Surgeon: Carole Civil, MD;  Location: [facility];  Service: Orthopedics;  Laterality: Right;  . CATARACT EXTRACTION    . CHOLECYSTECTOMY  2008  . COLONOSCOPY WITH ESOPHAGOGASTRODUODENOSCOPY (EGD) N/A 01/04/2013   Dr. Gala Romney: esophageal plaques with +KOH, hh. Gastric antrum abnormal, bx reactive gastropathy.  Anal canal hemorrhoids, colonic tics, normal TI, single polyp (sessile serrated tubular adenoma). Next TCS 12/2017  . 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 TEE WITHOUT CARDIOVERSION N/A 01/03/2016   Procedure: TRANSESOPHAGEAL ECHOCARDIOGRAM (TEE) WITH PROPOFOL;  Surgeon: Satira Sark, MD;  Location: [facility];  Service: Cardiovascular;  Laterality: N/A;    OB History    No data available       Home Medications    Prior to  Admission medications   Medication Sig Start Date End Date Taking? Authorizing Provider  furosemide (LASIX) 20 MG tablet Take 20-40 mg by mouth 2 (two) times daily as needed for fluid. Reported on 01/15/2016 12/16/14   [provider]  ipratropium-albuterol (DUONEB) 0.5-2.5 (3) MG/3ML SOLN Take 3 mLs by nebulization 3 (three) times daily as needed (shorntess of breath/wheezing). Patient taking differently: Take 3 mLs by nebulization 3 (three) times daily as needed (shortness of breath/wheezing).  02/18/15   Kathie Dike, MD  KLOR-CON 10 10 MEQ tablet Take 10 mEq by mouth as needed (Only takes with the furosemide). Reported on 01/15/2016 11/13/14   [provider]  metoprolol succinate (TOPROL-XL) 50 MG 24 hr tablet TAKE 1 TABLET BY MOUTH EVERY DAY 12/21/16   Satira Sark, MD  PROAIR RESPICLICK 675 (90 BASE) MCG/ACT AEPB Inhale 2 puffs into the lungs every 4 (four) hours as needed (Shortness of breath).  12/27/14   [provider]    Family History Family History  Problem Relation Age of Onset  . Cervical cancer Mother   . Lung cancer Father   . Colon polyps Father        ???  . Pneumonia Brother   . Colon cancer Paternal Grandmother 88  . AAA (abdominal aortic aneurysm) Paternal Grandmother   . Breast cancer Paternal Grandmother   . Colon cancer Paternal Grandfather 25  . Barrett's esophagus Paternal Aunt   . Stomach cancer Neg Hx   . Pancreatic cancer Neg Hx     Social History Social History  Substance Use Topics  . Smoking status: Former Smoker    Years: 0.00    Types: Cigarettes    Quit date: 02/14/2009  . Smokeless tobacco: Never Used  . Alcohol use No     Allergies   Brintellix [vortioxetine]; Yellow jacket venom; Adhesive [tape]; and Zoloft [sertraline hcl]   Review of Systems Review of Systems  Constitutional: Negative for activity change and appetite change.  HENT: Negative for congestion, ear discharge, ear pain, facial swelling,  nosebleeds, rhinorrhea, sneezing and tinnitus.   Eyes: Negative for photophobia, pain and discharge.  Respiratory: Negative for cough, choking, shortness of breath and wheezing.   Cardiovascular: Negative for chest pain, palpitations and leg swelling.  Gastrointestinal: Negative for abdominal pain, blood in stool, constipation, diarrhea, nausea and vomiting.  Genitourinary: Negative for difficulty urinating, dysuria, flank pain, frequency and hematuria.  Musculoskeletal: Positive for arthralgias. Negative for back pain, gait problem, myalgias and neck pain.  Skin: Negative for color change, rash and wound.  Neurological: Negative for dizziness, seizures, syncope, facial asymmetry, speech difficulty, weakness and numbness.  Hematological: Negative for adenopathy. Does not bruise/bleed easily.  Psychiatric/Behavioral: Negative for agitation, confusion, hallucinations, self-injury and suicidal ideas. The patient is not nervous/anxious.      Physical Exam Updated Vital Signs BP 103/65   Pulse 65   Temp 98.2 F (36.8 C) (Oral)   Resp 18   LMP 08/13/2011   SpO2 96%   Physical Exam  Musculoskeletal:  There is  good range of motion of the left hip. There is no deformity of the left quadricep area. The patella is in the midline. There is no effusion appreciated of the knee joint. There is an abrasion of the anterior left knee. The anterior tibial tuberosity is intact. There is no palpable mass of the posterior knee. There is no hematoma or palpable deformity of the calf area. There no temperature changes of the right or left lower extremity.     ED Treatments / Results  Labs (all labs ordered are listed, but only abnormal results are displayed) Labs Reviewed - No data to display  EKG  EKG Interpretation None       Radiology Dg Knee Complete 4 Views Left  Result Date: 02/28/2017 CLINICAL DATA:  Acute left knee pain following fall today. Initial encounter. EXAM: LEFT KNEE - COMPLETE  4+ VIEW COMPARISON:  01/14/2017 prior exams FINDINGS: No evidence of fracture, dislocation, or joint effusion. No evidence of arthropathy or other focal bone abnormality. Soft tissues are unremarkable. IMPRESSION: Negative. Electronically Signed   By: Margarette Canada M.D.   On: 02/28/2017 11:20    Procedures Procedures (including critical care time)  Medications Ordered in ED Medications  traMADol (ULTRAM) tablet 100 mg (not administered)  ondansetron (ZOFRAN) tablet 4 mg (not administered)     Initial Impression / Assessment and Plan / ED Course  I have reviewed the triage vital signs and the nursing notes.  Pertinent labs & imaging results that were available during my care of the patient were reviewed by me and considered in my medical decision making (see chart for details).      Final Clinical Impressions(s) / ED Diagnoses MDM Vital signs within normal limits. X-ray of the knee left knee is negative for fracture or dislocation. There are degenerative changes noted. There is no effusion appreciated.  I discussed the findings with the patient and her family. The patient is fitted with a knee sleeve on. She is asked to use Tylenol and ibuprofen for soreness. She is provided with an ice pack. She will follow-up with Dr. Sharlet Salina for additional evaluation and management if not improving.    Final diagnoses:  Knee injury, initial encounter  Contusion of left knee, initial encounter    New Prescriptions New Prescriptions   No medications on file     Annette Stable 02/28/17 1255    [name], MD 03/01/17 [phone]

## 2017-03-01 ENCOUNTER — Ambulatory Visit (HOSPITAL_COMMUNITY): Payer: Medicare HMO

## 2017-03-11 DIAGNOSIS — E6609 Other obesity due to excess calories: Secondary | ICD-10-CM | POA: Diagnosis not present

## 2017-03-11 DIAGNOSIS — R69 Illness, unspecified: Secondary | ICD-10-CM | POA: Diagnosis not present

## 2017-03-11 DIAGNOSIS — S8992XA Unspecified injury of left lower leg, initial encounter: Secondary | ICD-10-CM | POA: Diagnosis not present

## 2017-03-11 DIAGNOSIS — E119 Type 2 diabetes mellitus without complications: Secondary | ICD-10-CM | POA: Diagnosis not present

## 2017-03-11 DIAGNOSIS — D696 Thrombocytopenia, unspecified: Secondary | ICD-10-CM | POA: Diagnosis not present

## 2017-03-11 DIAGNOSIS — M79605 Pain in left leg: Secondary | ICD-10-CM | POA: Diagnosis not present

## 2017-03-11 DIAGNOSIS — Z6830 Body mass index (BMI) 30.0-30.9, adult: Secondary | ICD-10-CM | POA: Diagnosis not present

## 2017-03-11 DIAGNOSIS — Z1389 Encounter for screening for other disorder: Secondary | ICD-10-CM | POA: Diagnosis not present

## 2017-04-05 ENCOUNTER — Other Ambulatory Visit (HOSPITAL_COMMUNITY): Payer: Self-pay | Admitting: Registered Nurse

## 2017-04-06 ENCOUNTER — Other Ambulatory Visit (HOSPITAL_COMMUNITY): Payer: Self-pay | Admitting: Registered Nurse

## 2017-04-06 DIAGNOSIS — Z1231 Encounter for screening mammogram for malignant neoplasm of breast: Secondary | ICD-10-CM

## 2017-04-07 IMAGING — CT CT HEAD W/O CM
1 of 2 series · 16 of 30 positions shown, 20 images · non-contrast
Comparison: Head CT scan 04/05/2013.  Brain MRI 02/28/2013.

CLINICAL DATA: Headache for 1 week with nausea, vomiting and
photophobia.

EXAM:
CT HEAD WITHOUT CONTRAST
TECHNIQUE: Contiguous axial images were obtained from the base of the skull
through the vertex without intravenous contrast.

[Series 2: headseq 4.8 h37s · axial · 0.43mm/px · z∈[+96,+253]mm · 16 of 36 slices shown, 20 images]
[im 2/36  brain]
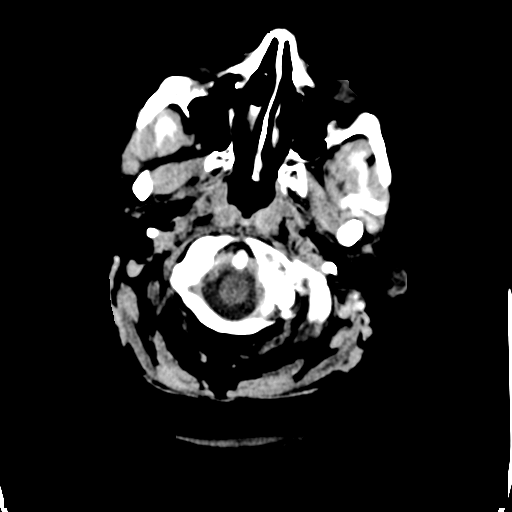
[im 2/36  bone]
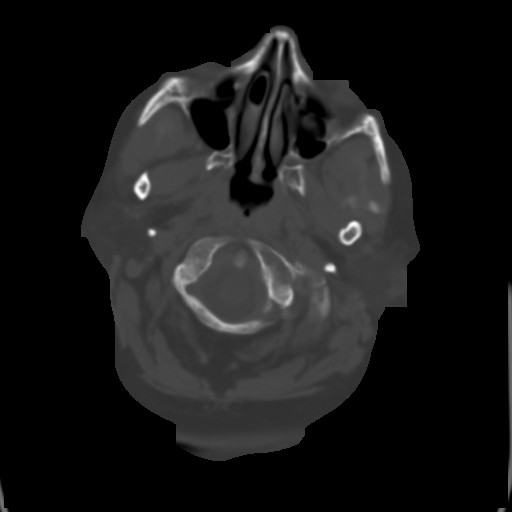
[im 4/36  brain]
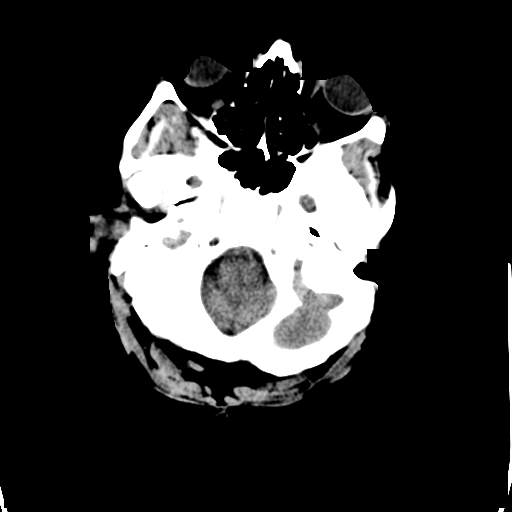
[im 7/36  brain]
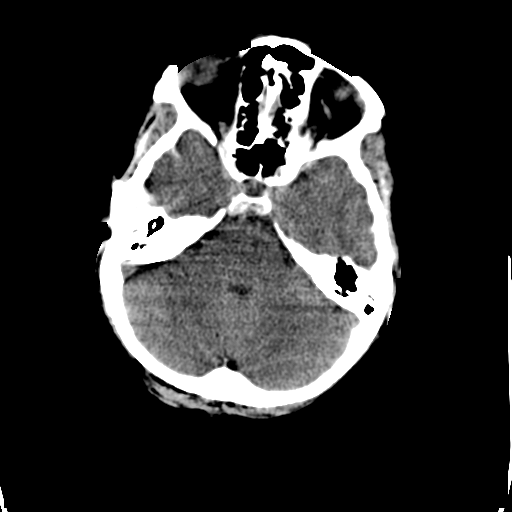
[im 9/36  brain]
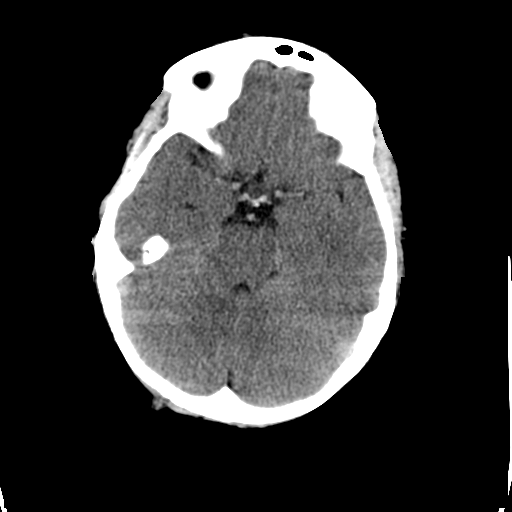
[im 11/36  brain]
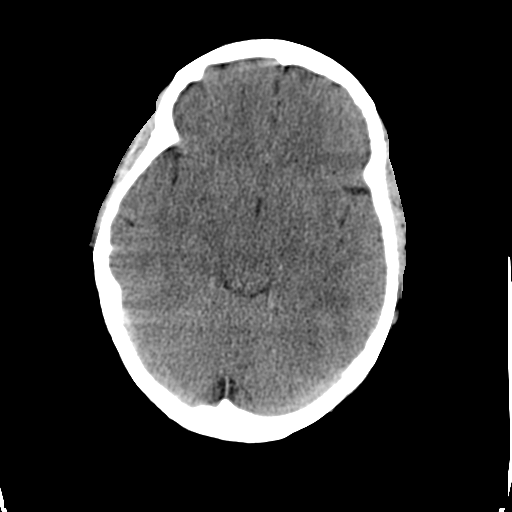
[im 11/36  bone]
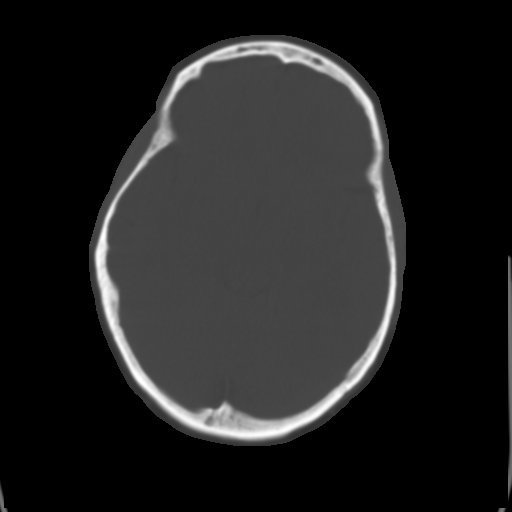
[im 12/36  brain]
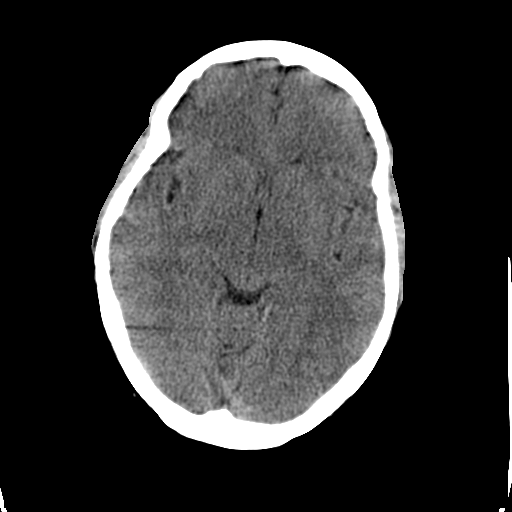
[im 16/36  brain]
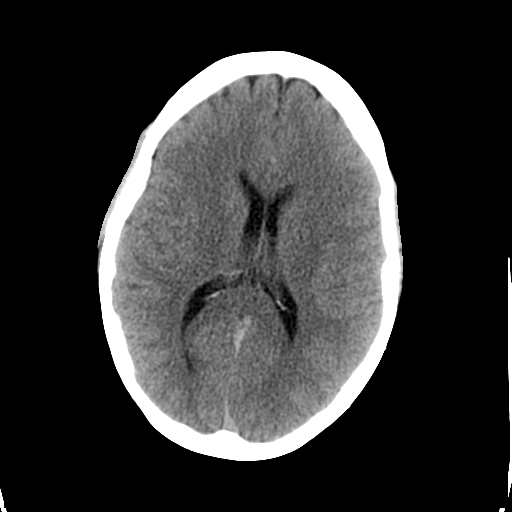
[im 17/36  brain]
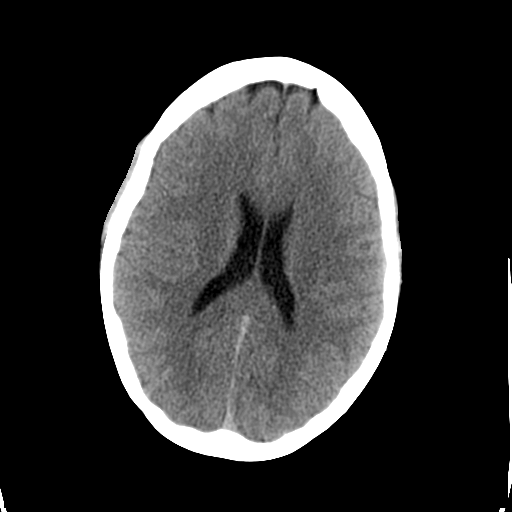
[im 19/36  brain]
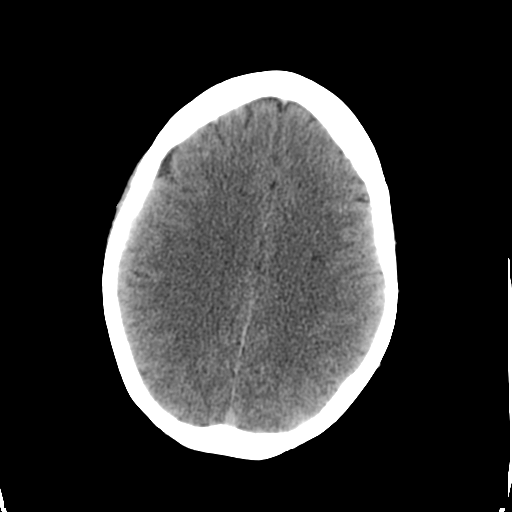
[im 19/36  bone]
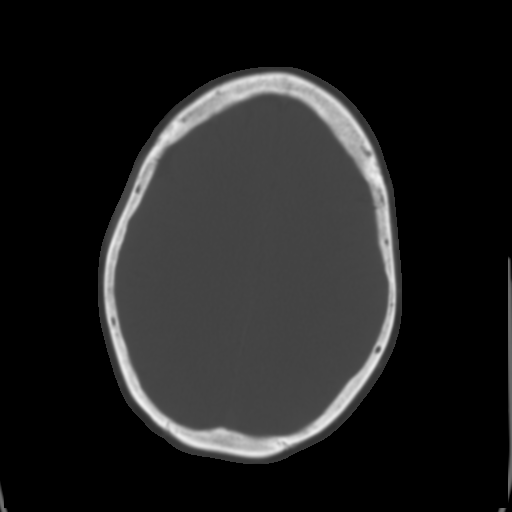
[im 21/36  brain]
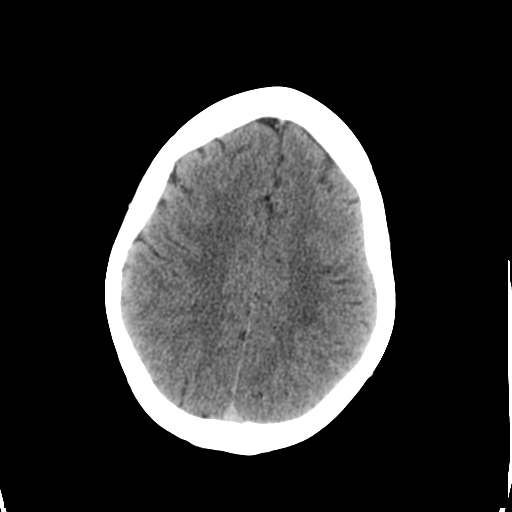
[im 24/36  brain]
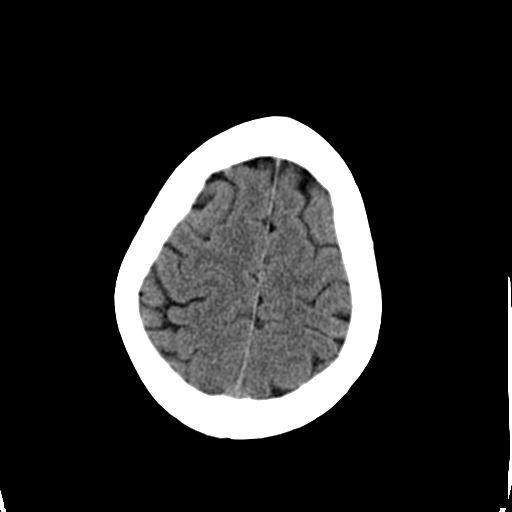
[im 26/36  brain]
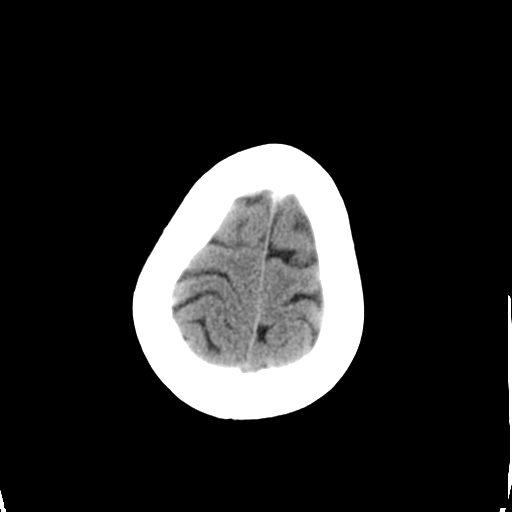
[im 27/36  brain]
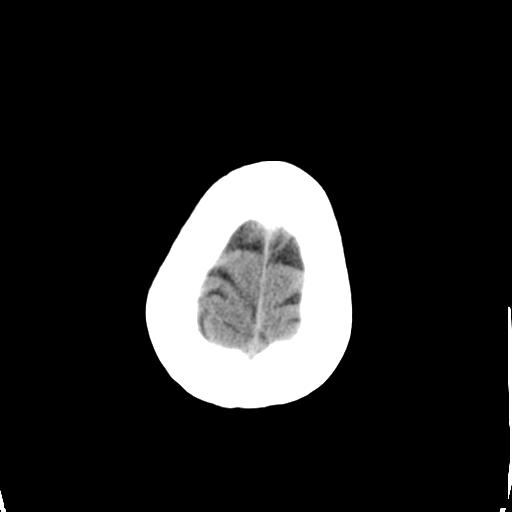
[im 27/36  bone]
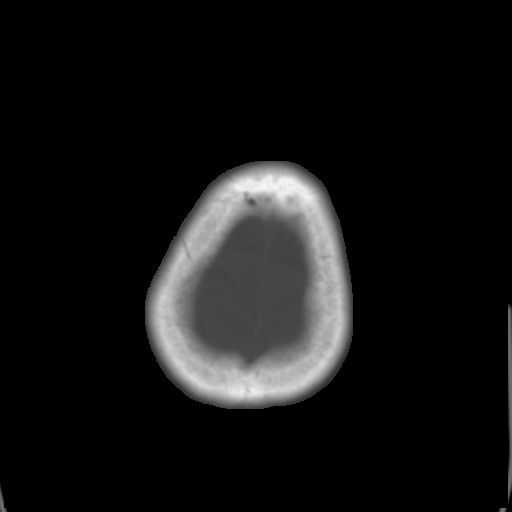
[im 29/36  brain]
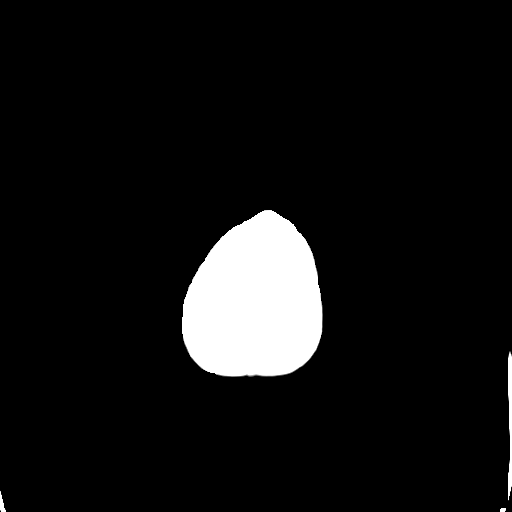
[im 32/36  brain]
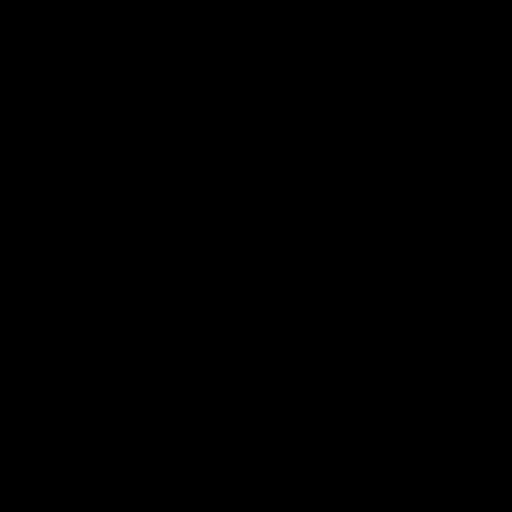
[im 34/36  brain]
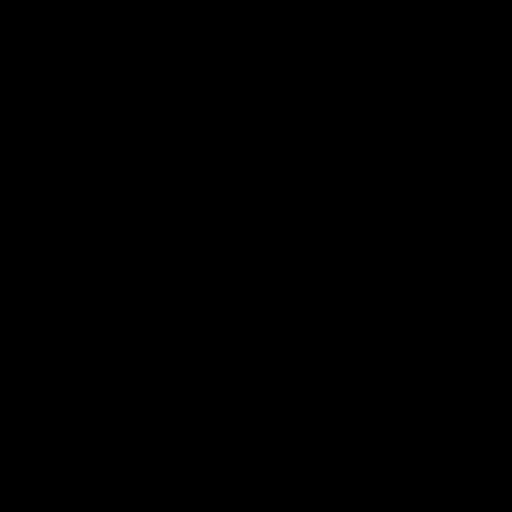

[16 of 30 positions shown; findings below may reference images not displayed]

FINDINGS: No acute intracranial abnormality including hemorrhage, infarct,
mass lesion, mass effect, midline shift or abnormal extra-axial
fluid collection is identified. Small focus of hypoattenuation in
the deep white matter of the right frontal lobe is new since the
prior study. There is no pneumocephalus or hydrocephalus. The
calvarium is intact. Imaged paranasal sinuses and mastoid air cells
are clear.
IMPRESSION: No acute abnormality.

Small focus of decreased attenuation in the deep white matter of the
right frontal lobe is new since the prior examinations may be due to
microvascular ischemic change.

## 2017-04-07 IMAGING — DX DG CHEST 2V
2 series · 2 of 2 positions shown · non-contrast
Comparison: February 16, 2015

CLINICAL DATA: Nasal congestion, centralized chest pain starting
yesterday.

EXAM:
CHEST  2 VIEW

[chest pa]
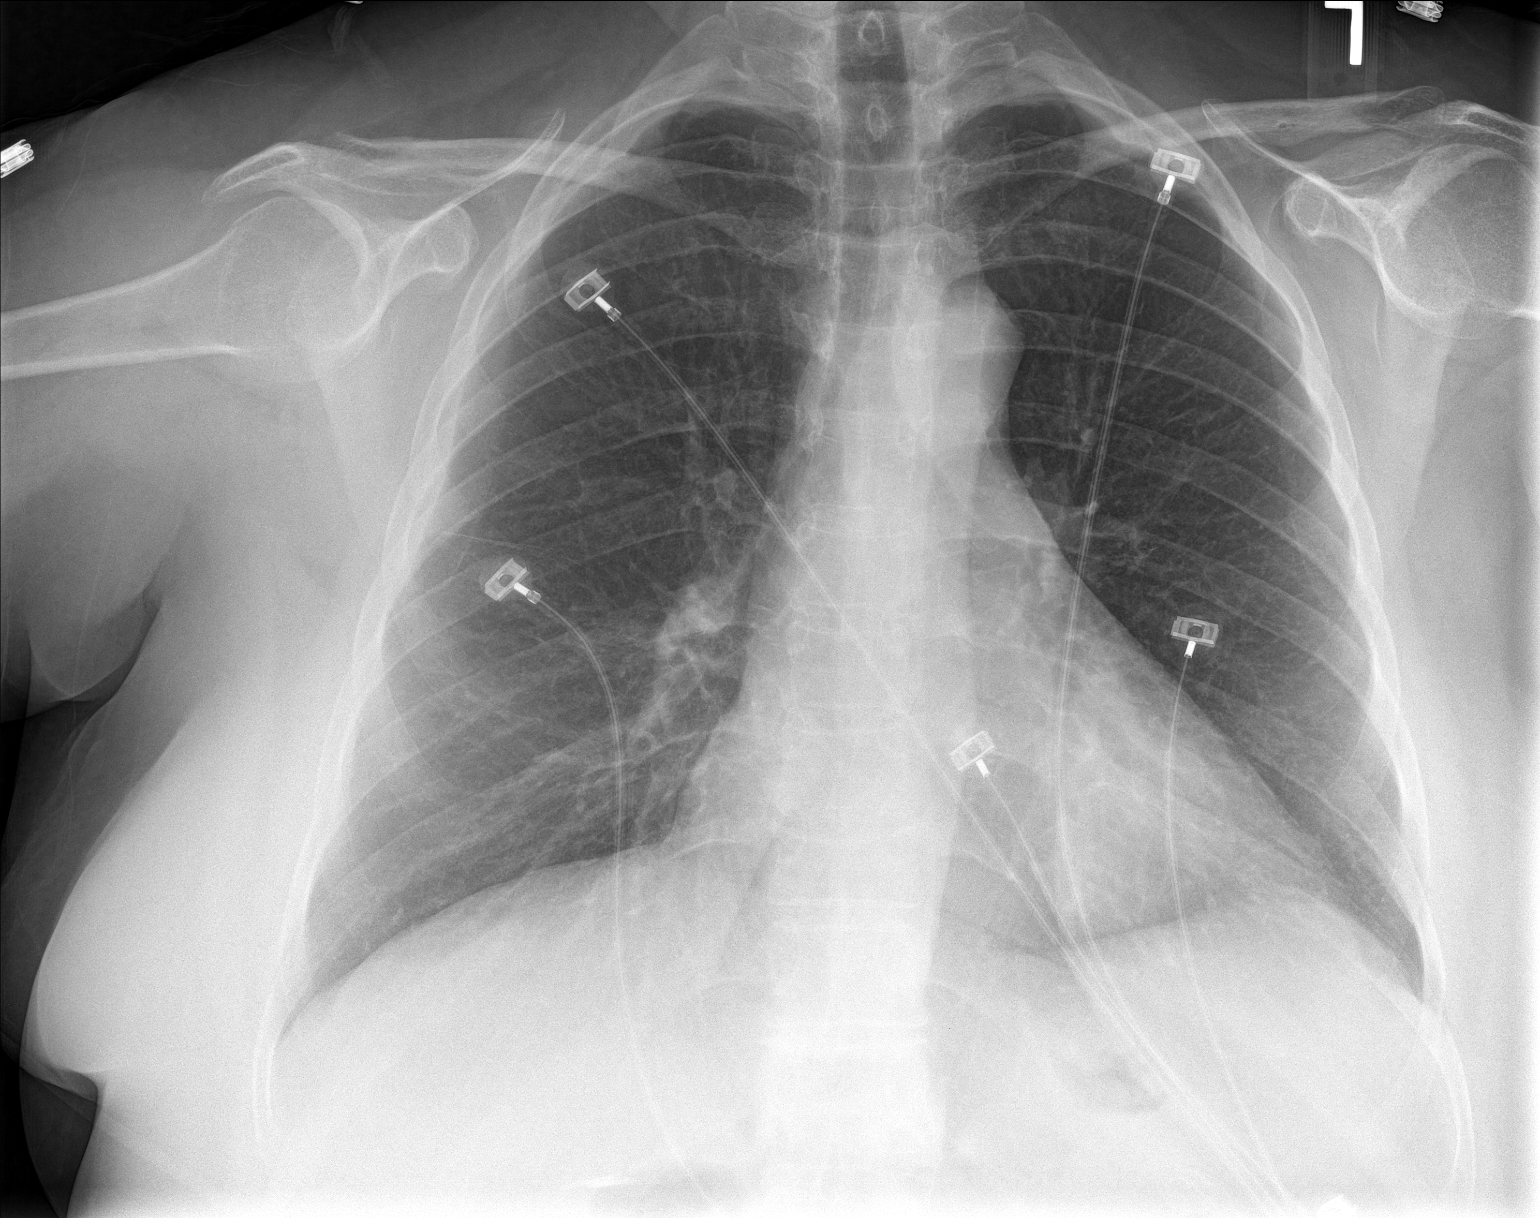

[chest lat]
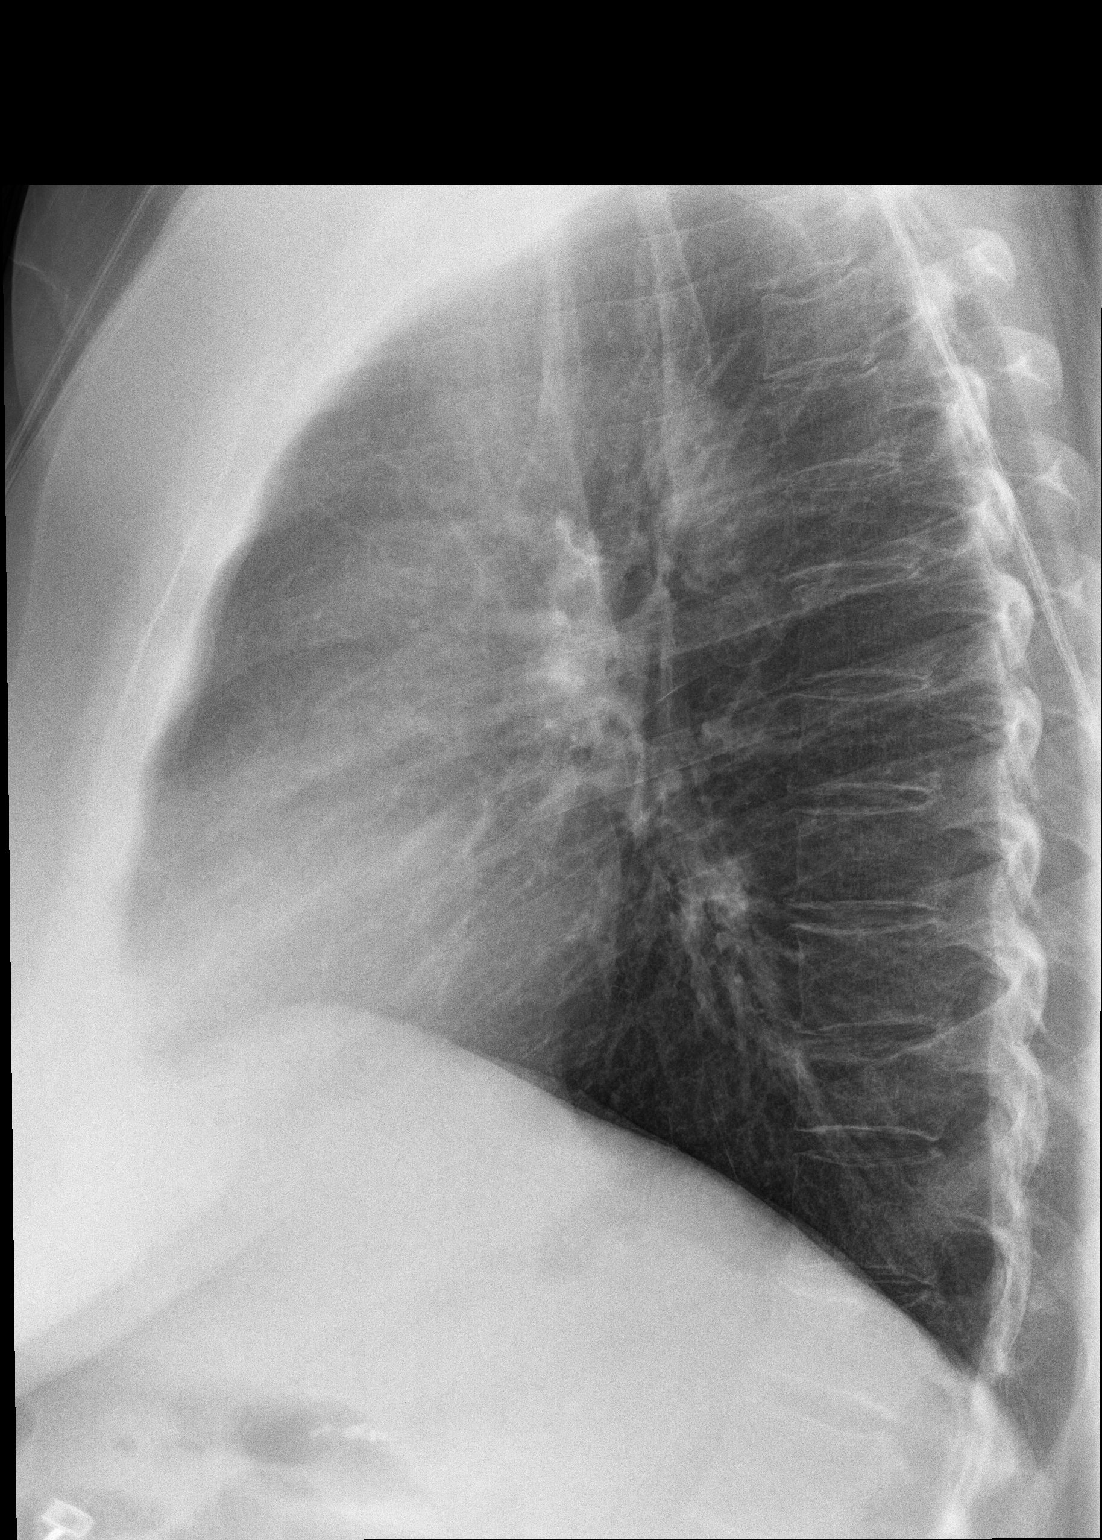

[2 of 2 positions shown; findings below may reference images not displayed]

FINDINGS: The heart size and mediastinal contours are within normal limits.
There is no focal infiltrate, pulmonary edema, or pleural effusion.
The visualized skeletal structures are unremarkable. Prior
cholecystectomy clips are identified.
IMPRESSION: No active cardiopulmonary disease.

## 2017-04-09 ENCOUNTER — Inpatient Hospital Stay (HOSPITAL_COMMUNITY): Admission: RE | Admit: 2017-04-09 | Payer: Medicare HMO | Source: Ambulatory Visit

## 2017-04-16 DIAGNOSIS — Z6830 Body mass index (BMI) 30.0-30.9, adult: Secondary | ICD-10-CM | POA: Diagnosis not present

## 2017-04-16 DIAGNOSIS — Z79899 Other long term (current) drug therapy: Secondary | ICD-10-CM | POA: Diagnosis not present

## 2017-04-16 DIAGNOSIS — N183 Chronic kidney disease, stage 3 (moderate): Secondary | ICD-10-CM | POA: Diagnosis not present

## 2017-04-16 DIAGNOSIS — M23307 Other meniscus derangements, unspecified meniscus, left knee: Secondary | ICD-10-CM | POA: Diagnosis not present

## 2017-04-16 DIAGNOSIS — E119 Type 2 diabetes mellitus without complications: Secondary | ICD-10-CM | POA: Diagnosis not present

## 2017-04-22 ENCOUNTER — Encounter: Payer: Self-pay | Admitting: Obstetrics & Gynecology

## 2017-05-10 ENCOUNTER — Ambulatory Visit: Payer: Medicare HMO | Admitting: Orthopedic Surgery

## 2017-05-19 ENCOUNTER — Inpatient Hospital Stay (HOSPITAL_COMMUNITY)
Admission: EM | Admit: 2017-05-19 | Discharge: 2017-05-22 | DRG: 281 | Disposition: A | Discharge status: Home / Self Care | Payer: Medicare HMO | Attending: Cardiology | Admitting: Cardiology

## 2017-05-19 ENCOUNTER — Emergency Department (HOSPITAL_COMMUNITY): Payer: Medicare HMO

## 2017-05-19 ENCOUNTER — Encounter (HOSPITAL_COMMUNITY): Payer: Self-pay | Admitting: Emergency Medicine

## 2017-05-19 DIAGNOSIS — Z91038 Other insect allergy status: Secondary | ICD-10-CM

## 2017-05-19 DIAGNOSIS — I639 Cerebral infarction, unspecified: Secondary | ICD-10-CM | POA: Diagnosis present

## 2017-05-19 DIAGNOSIS — E669 Obesity, unspecified: Secondary | ICD-10-CM | POA: Diagnosis present

## 2017-05-19 DIAGNOSIS — K219 Gastro-esophageal reflux disease without esophagitis: Secondary | ICD-10-CM | POA: Diagnosis not present

## 2017-05-19 DIAGNOSIS — F411 Generalized anxiety disorder: Secondary | ICD-10-CM | POA: Diagnosis present

## 2017-05-19 DIAGNOSIS — Z9081 Acquired absence of spleen: Secondary | ICD-10-CM | POA: Diagnosis not present

## 2017-05-19 DIAGNOSIS — I129 Hypertensive chronic kidney disease with stage 1 through stage 4 chronic kidney disease, or unspecified chronic kidney disease: Secondary | ICD-10-CM | POA: Diagnosis not present

## 2017-05-19 DIAGNOSIS — Z79899 Other long term (current) drug therapy: Secondary | ICD-10-CM

## 2017-05-19 DIAGNOSIS — Z888 Allergy status to other drugs, medicaments and biological substances status: Secondary | ICD-10-CM | POA: Diagnosis not present

## 2017-05-19 DIAGNOSIS — G8929 Other chronic pain: Secondary | ICD-10-CM | POA: Diagnosis present

## 2017-05-19 DIAGNOSIS — D693 Immune thrombocytopenic purpura: Secondary | ICD-10-CM | POA: Diagnosis present

## 2017-05-19 DIAGNOSIS — I200 Unstable angina: Secondary | ICD-10-CM | POA: Diagnosis not present

## 2017-05-19 DIAGNOSIS — N183 Chronic kidney disease, stage 3 unspecified: Secondary | ICD-10-CM | POA: Diagnosis present

## 2017-05-19 DIAGNOSIS — Z91048 Other nonmedicinal substance allergy status: Secondary | ICD-10-CM

## 2017-05-19 DIAGNOSIS — J45909 Unspecified asthma, uncomplicated: Secondary | ICD-10-CM | POA: Diagnosis present

## 2017-05-19 DIAGNOSIS — E1122 Type 2 diabetes mellitus with diabetic chronic kidney disease: Secondary | ICD-10-CM | POA: Diagnosis not present

## 2017-05-19 DIAGNOSIS — I10 Essential (primary) hypertension: Secondary | ICD-10-CM | POA: Diagnosis present

## 2017-05-19 DIAGNOSIS — F329 Major depressive disorder, single episode, unspecified: Secondary | ICD-10-CM | POA: Diagnosis present

## 2017-05-19 DIAGNOSIS — I252 Old myocardial infarction: Secondary | ICD-10-CM

## 2017-05-19 DIAGNOSIS — F418 Other specified anxiety disorders: Secondary | ICD-10-CM | POA: Diagnosis present

## 2017-05-19 DIAGNOSIS — Z87891 Personal history of nicotine dependence: Secondary | ICD-10-CM

## 2017-05-19 DIAGNOSIS — Z8673 Personal history of transient ischemic attack (TIA), and cerebral infarction without residual deficits: Secondary | ICD-10-CM

## 2017-05-19 DIAGNOSIS — E782 Mixed hyperlipidemia: Secondary | ICD-10-CM

## 2017-05-19 DIAGNOSIS — M109 Gout, unspecified: Secondary | ICD-10-CM | POA: Diagnosis not present

## 2017-05-19 DIAGNOSIS — E119 Type 2 diabetes mellitus without complications: Secondary | ICD-10-CM

## 2017-05-19 DIAGNOSIS — Z6830 Body mass index (BMI) 30.0-30.9, adult: Secondary | ICD-10-CM

## 2017-05-19 DIAGNOSIS — Z9049 Acquired absence of other specified parts of digestive tract: Secondary | ICD-10-CM | POA: Diagnosis not present

## 2017-05-19 DIAGNOSIS — I214 Non-ST elevation (NSTEMI) myocardial infarction: Secondary | ICD-10-CM | POA: Diagnosis present

## 2017-05-19 DIAGNOSIS — Z86711 Personal history of pulmonary embolism: Secondary | ICD-10-CM | POA: Diagnosis not present

## 2017-05-19 DIAGNOSIS — E785 Hyperlipidemia, unspecified: Secondary | ICD-10-CM | POA: Diagnosis present

## 2017-05-19 DIAGNOSIS — F32A Depression, unspecified: Secondary | ICD-10-CM | POA: Diagnosis present

## 2017-05-19 DIAGNOSIS — R072 Precordial pain: Secondary | ICD-10-CM | POA: Diagnosis not present

## 2017-05-19 DIAGNOSIS — M549 Dorsalgia, unspecified: Secondary | ICD-10-CM

## 2017-05-19 DIAGNOSIS — R079 Chest pain, unspecified: Secondary | ICD-10-CM | POA: Diagnosis not present

## 2017-05-19 DIAGNOSIS — I208 Other forms of angina pectoris: Secondary | ICD-10-CM | POA: Diagnosis not present

## 2017-05-19 DIAGNOSIS — R69 Illness, unspecified: Secondary | ICD-10-CM | POA: Diagnosis not present

## 2017-05-19 DIAGNOSIS — I2510 Atherosclerotic heart disease of native coronary artery without angina pectoris: Secondary | ICD-10-CM | POA: Diagnosis present

## 2017-05-19 HISTORY — DX: Atherosclerotic heart disease of native coronary artery without angina pectoris: I25.10

## 2017-05-19 LAB — LIPID PANEL
Cholesterol: 229 mg/dL — ABNORMAL HIGH (ref 0–200)
HDL: 57 mg/dL (ref >40)
LDL Cholesterol: 149 mg/dL — ABNORMAL HIGH (ref 0–99)
TRIGLYCERIDES: 114 mg/dL (ref <150)
Total CHOL/HDL Ratio: 4 RATIO
VLDL: 23 mg/dL (ref 0–40)

## 2017-05-19 LAB — CBC
HCT: 41 % (ref 36.0–46.0)
HEMOGLOBIN: 13.4 g/dL (ref 12.0–15.0)
MCH: 29.1 pg (ref 26.0–34.0)
MCHC: 32.7 g/dL (ref 30.0–36.0)
MCV: 89.1 fL (ref 78.0–100.0)
PLATELETS: 204 10*3/uL (ref 150–400)
RBC: 4.6 MIL/uL (ref 3.87–5.11)
RDW: 14 % (ref 11.5–15.5)
WBC: 7.1 10*3/uL (ref 4.0–10.5)

## 2017-05-19 LAB — BASIC METABOLIC PANEL
ANION GAP: 7 (ref 5–15)
BUN: 28 mg/dL — ABNORMAL HIGH (ref 6–20)
CALCIUM: 8.8 mg/dL — AB (ref 8.9–10.3)
CO2: 25 mmol/L (ref 22–32)
CREATININE: 1.78 mg/dL — AB (ref 0.44–1.00)
Chloride: 107 mmol/L (ref 101–111)
GFR, EST AFRICAN AMERICAN: 37 mL/min — AB (ref >60)
GFR, EST NON AFRICAN AMERICAN: 32 mL/min — AB (ref >60)
Glucose, Bld: 108 mg/dL — ABNORMAL HIGH (ref 65–99)
Potassium: 3.9 mmol/L (ref 3.5–5.1)
SODIUM: 139 mmol/L (ref 135–145)

## 2017-05-19 LAB — POCT I-STAT TROPONIN I: TROPONIN I, POC: 0 ng/mL (ref 0.00–0.08)

## 2017-05-19 LAB — TROPONIN I
TROPONIN I: 0.11 ng/mL — AB (ref <0.03)
TROPONIN I: 0.55 ng/mL — AB (ref <0.03)

## 2017-05-19 LAB — GLUCOSE, CAPILLARY: GLUCOSE-CAPILLARY: 62 mg/dL — AB (ref 65–99)

## 2017-05-19 LAB — D-DIMER, QUANTITATIVE (NOT AT ARMC): D DIMER QUANT: 0.55 ug{FEU}/mL — AB (ref 0.00–0.50)

## 2017-05-19 MED ORDER — IPRATROPIUM-ALBUTEROL 0.5-2.5 (3) MG/3ML IN SOLN: 3.0000 mL | Freq: Three times a day (TID) | RESPIRATORY_TRACT | Status: DC | PRN

## 2017-05-19 MED ORDER — MORPHINE SULFATE (PF) 2 MG/ML IV SOLN
1.0000 mg | INTRAVENOUS | Status: DC | PRN
  Administered 2017-05-20 – 2017-05-21 (×4): 1 mg via INTRAVENOUS
  Filled 2017-05-19 (×4): qty 1

## 2017-05-19 MED ORDER — LORAZEPAM 2 MG/ML IJ SOLN
1.0000 mg | Freq: Once | INTRAMUSCULAR | Status: AC
  Administered 2017-05-19: 1 mg via INTRAVENOUS

## 2017-05-19 MED ORDER — IOPAMIDOL (ISOVUE-370) INJECTION 76%
75.0000 mL | Freq: Once | INTRAVENOUS | Status: AC | PRN
  Administered 2017-05-19: 60 mL via INTRAVENOUS

## 2017-05-19 MED ORDER — ONDANSETRON HCL 4 MG/2ML IJ SOLN
4.0000 mg | Freq: Four times a day (QID) | INTRAMUSCULAR | Status: DC | PRN
  Administered 2017-05-21: 4 mg via INTRAVENOUS
  Filled 2017-05-19: qty 2

## 2017-05-19 MED ORDER — ESCITALOPRAM OXALATE 10 MG PO TABS
20.0000 mg | ORAL_TABLET | Freq: Every day | ORAL | Status: DC
  Administered 2017-05-20 – 2017-05-22 (×3): 20 mg via ORAL
  Filled 2017-05-19 (×3): qty 2

## 2017-05-19 MED ORDER — ASPIRIN 325 MG PO TABS
325.0000 mg | ORAL_TABLET | Freq: Once | ORAL | Status: AC
  Administered 2017-05-19: 325 mg via ORAL
  Filled 2017-05-19 (×2): qty 1

## 2017-05-19 MED ORDER — ACETAMINOPHEN 325 MG PO TABS
650.0000 mg | ORAL_TABLET | ORAL | Status: DC | PRN
  Administered 2017-05-19 – 2017-05-20 (×2): 650 mg via ORAL
  Filled 2017-05-19 (×4): qty 2

## 2017-05-19 MED ORDER — NITROGLYCERIN 0.4 MG SL SUBL
0.4000 mg | SUBLINGUAL_TABLET | Freq: Once | SUBLINGUAL | Status: AC
  Administered 2017-05-19: 0.4 mg via SUBLINGUAL
  Filled 2017-05-19: qty 1

## 2017-05-19 MED ORDER — NITROGLYCERIN IN D5W 200-5 MCG/ML-% IV SOLN
0.0000 ug/min | INTRAVENOUS | Status: DC
  Administered 2017-05-20: 5 ug/min via INTRAVENOUS
  Administered 2017-05-21: 15 ug/min via INTRAVENOUS
  Administered 2017-05-21: 20 ug/min via INTRAVENOUS
  Filled 2017-05-19: qty 250

## 2017-05-19 MED ORDER — LORAZEPAM 2 MG/ML IJ SOLN
INTRAMUSCULAR | Status: AC
  Filled 2017-05-19: qty 1

## 2017-05-19 MED ORDER — HEPARIN SODIUM (PORCINE) 5000 UNIT/ML IJ SOLN
5000.0000 [IU] | Freq: Three times a day (TID) | INTRAMUSCULAR | Status: DC
  Administered 2017-05-19: 5000 [IU] via SUBCUTANEOUS
  Filled 2017-05-19: qty 1

## 2017-05-19 MED ORDER — LORAZEPAM 2 MG/ML IJ SOLN
0.5000 mg | Freq: Once | INTRAMUSCULAR | Status: AC
  Administered 2017-05-19: 0.5 mg via INTRAVENOUS
  Filled 2017-05-19: qty 1

## 2017-05-19 MED ORDER — ALBUTEROL SULFATE (2.5 MG/3ML) 0.083% IN NEBU: 3.0000 mL | INHALATION_SOLUTION | RESPIRATORY_TRACT | Status: DC | PRN

## 2017-05-19 MED ORDER — ASPIRIN EC 325 MG PO TBEC
325.0000 mg | DELAYED_RELEASE_TABLET | Freq: Every day | ORAL | Status: DC
  Administered 2017-05-20 – 2017-05-21 (×2): 325 mg via ORAL
  Filled 2017-05-19 (×2): qty 1

## 2017-05-19 NOTE — ED Triage Notes (Signed)
Onset 2 hours while sitting, center chest pain, aching, heavy, nausea, headache x 2 weeks

## 2017-05-19 NOTE — ED Provider Notes (Signed)
[facility] DEPT Provider Note   CSN: [id number] Arrival date & time: 05/19/17  1535     History   Chief Complaint Chief Complaint  Patient presents with  . Chest Pain    HPI Summer Cook is a 51 y.o. female.  The history is provided by the patient.  Chest Pain   This is a new problem. The current episode started 1 to 2 hours ago. The problem occurs constantly. The problem has not changed since onset.The pain is associated with rest. The pain is present in the substernal region. The pain is moderate. The quality of the pain is described as heavy and pressure-like. The pain does not radiate. The symptoms are aggravated by deep breathing and exertion. Associated symptoms include diaphoresis, nausea, shortness of breath and weakness (generalized). Pertinent negatives include no back pain, no leg pain, no lower extremity edema, no syncope and no vomiting. She has tried nothing for the symptoms. The treatment provided no relief.   51 year old female who presents with chest pain. She has a history of previous PE but not anticoagulated. History of hypertension and previous stroke. Had 10-15 minutes of chest pressure this morning at rest that self resolved. Then about 1-2 hours prior to arrival had this onset of chest pressure over the center of his chest that has been unremitting. Occurred at rest when she was sitting. Worse when she walked. States that is difficult to take a deep breath as it also is worse. Associated with shortness of breath and lightheadedness, diaphoresis, nausea. No recent fevers, vomiting, diarrhea or abdominal pain. Does describe unilateral right-sided headache that has been on and off for the past 2 weeks.  Past Medical History:  Diagnosis Date  . Allergy   . Arthritis   . Asthma   . Candida esophagitis ([place])   . Chronic chest pain   . Depression   . Essential hypertension, benign   . GERD (gastroesophageal reflux disease)   . Gout   . Hemorrhoids   . ITP  (idiopathic thrombocytopenic purpura) 08/2010   Treated with Prednisone, IVIg (transient response), Rituxan (no response), Cytoxan (no response).  Last was Prednisone 38m; 2 wk in 10/2012.  She also was on Prednisone bridged with Cellcept briefly but stopped due to lack of insurance.   . Normal cardiac stress test 02/2014  . Pulmonary embolism ([place] 04/2012  . Renal insufficiency   . Serrated adenoma of colon   . Steroid-induced diabetes ([place]   . Stroke ([place]   . Thrush, oral 05/29/2011    Patient Active Problem List   Diagnosis Date Noted  . Diabetes mellitus type 2, uncomplicated ([place] 040/98/1191 . Myocardial infarction ([facility] 02/25/2017  . Carpal tunnel syndrome of right wrist   . Carpal tunnel syndrome, left   . C7 radiculopathy 01/07/2016  . History of stroke   . Chronic back pain 12/23/2015  . Cerebrovascular accident (CVA) due to embolism ([place] 12/16/2015  . Back pain 11/22/2015  . Asthma, chronic 02/16/2015  . Anxiety state 02/16/2015  . CKD (chronic kidney disease) stage 3, GFR 30-59 ml/min 04/28/2014  . Nausea with vomiting 01/25/2013  . Abnormal LFTs 01/25/2013  . Orthostatic hypotension 01/25/2013  . Unspecified constipation 12/27/2012  . Rectal bleeding 12/26/2012  . PE (pulmonary embolism) 05/03/2012  . Bilateral lower extremity pain 04/12/2012  . Thrombocytopenia ([facility] 04/12/2012  . Tachycardia 06/04/2011  . Hemorrhoids 06/04/2011  . 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 Immunosuppressed status ([place] 06/04/2011  . Depression  06/04/2011  . Obesity 06/04/2011  . Hypertension 03/18/2011  . Idiopathic thrombocytopenic purpura ([place]) 03/18/2011    Past Surgical History:  Procedure Laterality Date  . BONE MARROW BIOPSY    . CARPAL TUNNEL RELEASE    . CARPAL TUNNEL RELEASE Left 05/20/2016   Procedure: CARPAL TUNNEL RELEASE;  Surgeon: Carole Civil, MD;  Location: [facility];  Service: Orthopedics;  Laterality: Left;  .  CARPAL TUNNEL RELEASE Right 07/16/2016   Procedure: RIGHT CARPAL TUNNEL RELEASE;  Surgeon: Carole Civil, MD;  Location: [facility];  Service: Orthopedics;  Laterality: Right;  . CATARACT EXTRACTION    . CHOLECYSTECTOMY  2008  . COLONOSCOPY WITH ESOPHAGOGASTRODUODENOSCOPY (EGD) N/A 01/04/2013   Dr. Gala Romney: esophageal plaques with +KOH, hh. Gastric antrum abnormal, bx reactive gastropathy. Anal canal hemorrhoids, colonic tics, normal TI, single polyp (sessile serrated tubular adenoma). Next TCS 12/2017  . 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 TEE WITHOUT CARDIOVERSION N/A 01/03/2016   Procedure: TRANSESOPHAGEAL ECHOCARDIOGRAM (TEE) WITH PROPOFOL;  Surgeon: Satira Sark, MD;  Location: [facility] SU[facility];  Service: Cardiovascular;  Laterality: N/A;    OB History    No data available       Home Medications    Prior to Admission medications   Medication Sig Start Date End Date Taking? Authorizing Provider  escitalopram (LEXAPRO) 20 MG tablet Take 20 mg by mouth daily.  05/03/17  Yes [provider]  ipratropium-albuterol (DUONEB) 0.5-2.5 (3) MG/3ML SOLN Take 3 mLs by nebulization 3 (three) times daily as needed (shorntess of breath/wheezing). Patient taking differently: Take 3 mLs by nebulization 3 (three) times daily as needed (shortness of breath/wheezing).  02/18/15  Yes Kathie Dike, MD  PROAIR RESPICLICK 419 (90 BASE) MCG/ACT AEPB Inhale 2 puffs into the lungs every 4 (four) hours as needed (Shortness of breath).  12/27/14  Yes [provider]    Family History Family History  Problem Relation Age of Onset  . Cervical cancer Mother   . Lung cancer Father   . Colon polyps Father        ???  . Pneumonia Brother   . Colon cancer Paternal Grandmother 88  . AAA (abdominal aortic aneurysm) Paternal Grandmother   . Breast cancer Paternal Grandmother   . Colon cancer Paternal Grandfather 37  . Barrett's esophagus Paternal Aunt   . Stomach cancer Neg Hx   . Pancreatic cancer Neg  Hx     Social History Social History  Substance Use Topics  . Smoking status: Former Smoker    Years: 0.00    Types: Cigarettes    Quit date: 02/14/2009  . Smokeless tobacco: Never Used  . Alcohol use No     Allergies   Brintellix [vortioxetine]; Yellow jacket venom; Adhesive [tape]; and Zoloft [sertraline hcl]   Review of Systems Review of Systems  Constitutional: Positive for diaphoresis.  Respiratory: Positive for shortness of breath.   Cardiovascular: Positive for chest pain. Negative for syncope.  Gastrointestinal: Positive for nausea. Negative for vomiting.  Musculoskeletal: Negative for back pain.  Neurological: Positive for weakness (generalized).  All other systems reviewed and are negative.    Physical Exam Updated Vital Signs BP (!) 132/97   Pulse 70   Temp 98.2 F (36.8 C) (Oral)   Resp 10   Ht _0  (1.651 m)   Wt 86.2 kg (190 lb)   LMP 08/13/2011   SpO2 98%   BMI 31.62 kg/m   Physical Exam Physical Exam  Nursing note and vitals reviewed.  Constitutional: Well developed, well nourished, non-toxic, and in no acute distress Head: Normocephalic and atraumatic.  Mouth/Throat: Oropharynx is clear and moist.  Neck: Normal range of motion. Neck supple.  Cardiovascular: Normal rate and regular rhythm.   Pulmonary/Chest: Effort normal and breath sounds normal.  Abdominal: Soft. There is no tenderness. There is no rebound and no guarding.  Musculoskeletal: Normal range of motion.  Neurological: Alert, no facial droop, fluent speech, moves all extremities symmetrically Skin: Skin is warm and dry.  Psychiatric: Cooperative   ED Treatments / Results  Labs (all labs ordered are listed, but only abnormal results are displayed) Labs Reviewed  BASIC METABOLIC PANEL - Abnormal; Notable for the following:       Result Value   Glucose, Bld 108 (*)    BUN 28 (*)    Creatinine, Ser 1.78 (*)    Calcium 8.8 (*)    GFR calc non Af Amer 32 (*)    GFR calc Af  Amer 37 (*)    All other components within normal limits  D-DIMER, QUANTITATIVE (NOT AT [facility]) - Abnormal; Notable for the following:    D-Dimer, Quant 0.55 (*)    All other components within normal limits  CBC  I-STAT TROPONIN, ED  POCT I-STAT TROPONIN I    EKG  EKG Interpretation  Date/Time:  Wednesday May 19 2017 15:44:43 EDT Ventricular Rate:  86 PR Interval:  162 QRS Duration: 126 QT Interval:  410 QTC Calculation: 490 R Axis:   126 Text Interpretation:  Normal sinus rhythm Right axis deviation Non-specific intra-ventricular conduction block Cannot rule out Anteroseptal infarct , age undetermined Abnormal ECG similar to previous EKG  Confirmed by Brantley Stage [phone]) on 05/19/2017 3:54:56 PM       Radiology Dg Chest 2 View  Result Date: 05/19/2017 CLINICAL DATA:  Chest pain for 2 hours. EXAM: CHEST  2 VIEW COMPARISON:  01/14/2017 FINDINGS: The cardiac silhouette, mediastinal and hilar contours are within normal limits and stable. The lungs are clear. No pleural effusion. The bony thorax is intact. Remote compression deformities are noted involving T12 and T10. IMPRESSION: No acute cardiopulmonary findings. Electronically Signed   By: Marijo Sanes M.D.   On: 05/19/2017 17:31   Ct Angio Chest Pe W And/or Wo Contrast  Result Date: 05/19/2017 CLINICAL DATA:  Chest pain.  History of pulmonary embolism EXAM: CT ANGIOGRAPHY CHEST WITH CONTRAST TECHNIQUE: Multidetector CT imaging of the chest was performed using the standard protocol during bolus administration of intravenous contrast. Multiplanar CT image reconstructions and MIPs were obtained to evaluate the vascular anatomy. CONTRAST:  75 mL Isovue COMPARISON:  None. FINDINGS: Cardiovascular: No filling defects within the pulmonary arteries to suggest acute pulmonary embolism. No acute findings of the aorta or great vessels. No pericardial fluid. No acute findings of the aorta great vessels.  No pericardial fluid. Mediastinum/Nodes: No  axillary supraclavicular adenopathy. No mediastinal or hilar adenopathy. No pericardial fluid esophagus normal Lungs/Pleura: No pulmonary infarction. Mild basilar atelectasis. No pneumonia. No pneumothorax. Upper Abdomen: Limited view of the liver, kidneys, pancreas are unremarkable. Normal adrenal glands. Musculoskeletal: No aggressive osseous lesion. Review of the MIP images confirms the above findings. IMPRESSION: 1. No evidence acute pulmonary embolism. 2. No acute pulmonary parenchymal findings. Electronically Signed   By: Suzy Bouchard M.D.   On: 05/19/2017 18:21    Procedures Procedures (including critical care time)  Medications Ordered in ED Medications  nitroGLYCERIN (NITROSTAT) SL tablet 0.4 mg (0.4 mg Sublingual Given 05/19/17 1617)  iopamidol (  ISOVUE-370) 76 % injection 75 mL (60 mLs Intravenous Contrast Given 05/19/17 1759)  LORazepam (ATIVAN) injection 1 mg (1 mg Intravenous Given 05/19/17 1732)     Initial Impression / Assessment and Plan / ED Course  I have reviewed the triage vital signs and the nursing notes.  Pertinent labs & imaging results that were available during my care of the patient were reviewed by me and considered in my medical decision making (see chart for details).     51 year old female with history of obesity, hypertension, and previous stroke who presents with chest pain. She is afebrile hemodynamically stable. Some concerning features that could be suggestive of ACS. Initial troponin is normal. Chest x-ray shows no acute cardiopulmonary processes. EKG without acute ischemic changes.  Intermediate risk PE in the setting of previous PE. D-dimer is positive. CT angio chest shows no acute PE or other acute cardiopulmonary processes.  Heart score of 3 but given concerning history, 4-5. Plan to admit for observation for cardiac rule out. Discussed with Dr. Ernestina Patches.  Final Clinical Impressions(s) / ED Diagnoses   Final diagnoses:  Nonspecific chest pain     New Prescriptions New Prescriptions   No medications on file     Forde Dandy, MD 05/19/17 Lurline Hare

## 2017-05-19 NOTE — ED Notes (Signed)
Report given to Bri, RN

## 2017-05-19 NOTE — ED Triage Notes (Signed)
Pt states it hurt to take a deep breath, and she feels weak

## 2017-05-19 NOTE — Progress Notes (Addendum)
Called by nurse practitioner, the patient earlier admitted this evening, had   troponin elevation from 0.11-0.55.   Came to see patient, patient lying comfortably in bed. Still complains of 6/10 intensity chest pain.  On exam- chest clear to auscultation bilaterally                  Heart-S1-S2, regular                   Repeat EKG shows left axis deviation, normal sinus rhythm. Nonspecific ST changes.  Called and discussed with cardiologist on call Dr. Radford Pax, who recommended starting heparin protocol, IV nitroglycerin infusion. As there are no stepdown [facility] available at [facility], she recommended patient to be kept at [facility] and seen by cardiology service in a.m.   Patient will be put on waiting list for transfer to [facility] by [facility]. Dr. Hal Hope is the accepting physician.  In the meantime will transfer patient to stepdown, continue heparin, IV nitroglycerin, morphine when necessary for pain. We'll continue to cycle troponin every 6 hours 3.  Total time spent- 35 minutes

## 2017-05-19 NOTE — Progress Notes (Signed)
CRITICAL VALUE ALERT  Critical Value:  Troponin 0.55  Date & Time Notied:  05-19-17 2315  Provider Notified: k schorr, midlevel  Orders Received/Actions taken: no orders received at this time.  Patient resting quietly in bed, with no complaints of pain at this time.  Vitals stable.

## 2017-05-19 NOTE — Progress Notes (Signed)
CRITICAL VALUE ALERT  Critical Value: Troponin 0.11  Date & Time Notied: 05-19-17  Provider Notified: Ernestina Patches, MD  Orders Received/Actions taken: No new orders received   Notified MD of increased troponin level.  No new orders received at this time.  Will continue to monitor patient.

## 2017-05-19 NOTE — H&P (Addendum)
History and Physical    Summer Cook:[id number] DOB: 13-Jan-1966 DOA: 05/19/2017  Referring MD/NP/PA: Oleta Mouse PCP: Redmond School, MD  Outpatient Specialists: none  Patient coming from: Home   Chief Complaint: CP  HPI: Summer Cook is a 51 y.o. female with medical history significant of chronic CP, CVA, ITP presenting w/ CP. Pt states that she had episode of moderate to severe CP earlier today for about 15 min. No radiation. Pain seemed to worsen w/ extertion. Mild nausea and diaphoresis. Pt also reports prior hx/o CVA w/ major deficits and PE from several years ago. Also noted to have had stress test 02/2014 that was normal. 2D ECHO 12/2015 that was WNL. Had recurrence of CP on arrival to ER.  Pt also noted to have left [facility] 11/2015 w/ chief complaint of CVA.  ED Course: Presented to ER afebrile hemodynamically stable. EKG troponin negative 1. Chest x-ray within normal limits. D-dimer minimally elevated. CT angiogram negative for PE. HEART Score 3-4  Review of Systems: As per HPI otherwise 10 point review of systems negative.    Past Medical History:  Diagnosis Date  . Allergy   . Arthritis   . Asthma   . Candida esophagitis ([place])   . Chronic chest pain   . Depression   . Essential hypertension, benign   . GERD (gastroesophageal reflux disease)   . Gout   . Hemorrhoids   . 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  Last was Prednisone 24m; 2 wk in 10/2012.  She also was on Prednisone bridged with Cellcept briefly but stopped due to lack of insurance.   . Normal cardiac stress test 02/2014  . Pulmonary embolism ([place] 04/2012  . Renal insufficiency   . Serrated adenoma of colon   . [place]   . Stroke ([place]   . Thrush, oral 05/29/2011    Past Surgical History:  Procedure Laterality Date  . BONE MARROW BIOPSY    . CARPAL TUNNEL RELEASE    .  CARPAL TUNNEL RELEASE Left 05/20/2016   Procedure: CARPAL TUNNEL RELEASE;  Surgeon: SCarole Civil MD;  Location: [facility];  Service: Orthopedics;  Laterality: Left;  . CARPAL TUNNEL RELEASE Right 07/16/2016   Procedure: RIGHT CARPAL TUNNEL RELEASE;  Surgeon: SCarole Civil MD;  Location: [facility];  Service: Orthopedics;  Laterality: Right;  . CATARACT EXTRACTION    . CHOLECYSTECTOMY  2008  . COLONOSCOPY WITH ESOPHAGOGASTRODUODENOSCOPY (EGD) N/A 01/04/2013   Dr. RGala Romney esophageal plaques with +KOH, hh. Gastric antrum abnormal, bx reactive gastropathy. Anal canal hemorrhoids, colonic tics, normal TI, single polyp (sessile serrated tubular adenoma). Next TCS 12/2017  . 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 TEE WITHOUT CARDIOVERSION N/A 01/03/2016   Procedure: TRANSESOPHAGEAL ECHOCARDIOGRAM (TEE) WITH PROPOFOL;  Surgeon: SSatira Sark MD;  Location: [facility];  Service: Cardiovascular;  Laterality: N/A;     reports that she quit smoking about 8 years ago. Her smoking use included Cigarettes. She quit after 0.00 years of use. She has never used smokeless tobacco. She reports that she does not drink alcohol or use drugs.  Allergies  Allergen Reactions  . Brintellix [Vortioxetine] Itching  . Yellow Jacket Venom Swelling  . Adhesive [Tape] Rash    Please use paper tape  . Zoloft [Sertraline Hcl] Diarrhea    Family History  Problem Relation Age of Onset  . Cervical cancer Mother   . Lung cancer  Father   . Colon polyps Father        ???  . Pneumonia Brother   . Colon cancer Paternal Grandmother 88  . AAA (abdominal aortic aneurysm) Paternal Grandmother   . Breast cancer Paternal Grandmother   . Colon cancer Paternal Grandfather 44  . Barrett's esophagus Paternal Aunt   . Stomach cancer Neg Hx   . Pancreatic cancer Neg Hx      Prior to Admission medications   Medication Sig Start Date End Date Taking? Authorizing Provider  escitalopram (LEXAPRO) 20 MG tablet Take 20 mg by mouth  daily.  05/03/17  Yes [provider]  ipratropium-albuterol (DUONEB) 0.5-2.5 (3) MG/3ML SOLN Take 3 mLs by nebulization 3 (three) times daily as needed (shorntess of breath/wheezing). Patient taking differently: Take 3 mLs by nebulization 3 (three) times daily as needed (shortness of breath/wheezing).  02/18/15  Yes Kathie Dike, MD  PROAIR RESPICLICK 716 (90 BASE) MCG/ACT AEPB Inhale 2 puffs into the lungs every 4 (four) hours as needed (Shortness of breath).  12/27/14  Yes [provider]    Physical Exam: Vitals:   05/19/17 1730 05/19/17 1830 05/19/17 1845 05/19/17 1900  BP: (!) 132/97 (!) 134/104  (!) 137/106  Pulse: 70 67 68 68  Resp: _0 Temp:      TempSrc:      SpO2: 98% 93% 96% 98%  Weight:      Height:          Constitutional: NAD, calm, comfortable Vitals:   05/19/17 1730 05/19/17 1830 05/19/17 1845 05/19/17 1900  BP: (!) 132/97 (!) 134/104  (!) 137/106  Pulse: 70 67 68 68  Resp: _1 Temp:      TempSrc:      SpO2: 98% 93% 96% 98%  Weight:      Height:       Eyes: PERRL, lids and conjunctivae normal ENMT: Mucous membranes are moist. Posterior pharynx clear of any exudate or lesions.Normal dentition.  Neck: normal, supple, no masses, no thyromegaly Respiratory: clear to auscultation bilaterally, no wheezing, no crackles. Normal respiratory effort. No accessory muscle use.  Cardiovascular: Regular rate and rhythm, no murmurs / rubs / gallops. No extremity edema. 2+ pedal pulses. No carotid bruits.  Abdomen: no tenderness, no masses palpated. No hepatosplenomegaly. Bowel sounds positive.  Musculoskeletal: no clubbing / cyanosis. No joint deformity upper and lower extremities. Good ROM, no contractures. Normal muscle tone.  Skin: no rashes, lesions, ulcers. No induration Neurologic: CN 2-12 grossly intact. Sensation intact, DTR normal. Strength 5/5 in all 4.  Psychiatric: Normal judgment and insight. Alert and oriented x 3. Normal  mood.   Labs on Admission: I have personally reviewed following labs and imaging studies  CBC:  Recent Labs Lab 05/19/17 1557  WBC 7.1  HGB 13.4  HCT 41.0  MCV 89.1  PLT 967   Basic Metabolic Panel:  Recent Labs Lab 05/19/17 1557  NA 139  K 3.9  CL 107  CO2 25  GLUCOSE 108*  BUN 28*  CREATININE 1.78*  CALCIUM 8.8*   GFR: Estimated Creatinine Clearance: 40.6 mL/min (A) (by C-G formula based on SCr of 1.78 mg/dL (H)). Liver Function Tests: No results for input(s): AST, ALT, ALKPHOS, BILITOT, PROT, ALBUMIN in the last 168 hours. No results for input(s): LIPASE, AMYLASE in the last 168 hours. No results for input(s): AMMONIA in the last 168 hours. Coagulation Profile: No results for input(s): INR, PROTIME in the last 168  hours. Cardiac Enzymes: No results for input(s): CKTOTAL, CKMB, CKMBINDEX, TROPONINI in the last 168 hours. BNP (last 3 results) No results for input(s): PROBNP in the last 8760 hours. HbA1C: No results for input(s): HGBA1C in the last 72 hours. CBG: No results for input(s): GLUCAP in the last 168 hours. Lipid Profile: No results for input(s): CHOL, HDL, LDLCALC, TRIG, CHOLHDL, LDLDIRECT in the last 72 hours. Thyroid Function Tests: No results for input(s): TSH, T4TOTAL, FREET4, T3FREE, THYROIDAB in the last 72 hours. Anemia Panel: No results for input(s): VITAMINB12, FOLATE, FERRITIN, TIBC, IRON, RETICCTPCT in the last 72 hours. Urine analysis:    Component Value Date/Time   COLORURINE YELLOW 09/25/2016 1552   APPEARANCEUR CLEAR 09/25/2016 1552   LABSPEC 1.005 09/25/2016 1552   PHURINE 7.0 09/25/2016 1552   GLUCOSEU NEGATIVE 09/25/2016 1552   HGBUR SMALL (A) 09/25/2016 1552   BILIRUBINUR NEGATIVE 09/25/2016 1552   KETONESUR NEGATIVE 09/25/2016 1552   PROTEINUR >=300 (A) 09/25/2016 1552   UROBILINOGEN 0.2 02/17/2015 1325   NITRITE NEGATIVE 09/25/2016 1552   LEUKOCYTESUR NEGATIVE 09/25/2016 1552   Sepsis  Labs: _0 (procalcitonin:4,lacticidven:4) )No results found for this or any previous visit (from the past 240 hour(s)).   Radiological Exams on Admission: Dg Chest 2 View  Result Date: 05/19/2017 CLINICAL DATA:  Chest pain for 2 hours. EXAM: CHEST  2 VIEW COMPARISON:  01/14/2017 FINDINGS: The cardiac silhouette, mediastinal and hilar contours are within normal limits and stable. The lungs are clear. No pleural effusion. The bony thorax is intact. Remote compression deformities are noted involving T12 and T10. IMPRESSION: No acute cardiopulmonary findings. Electronically Signed   By: Marijo Sanes M.D.   On: 05/19/2017 17:31   Ct Angio Chest Pe W And/or Wo Contrast  Result Date: 05/19/2017 CLINICAL DATA:  Chest pain.  History of pulmonary embolism EXAM: CT ANGIOGRAPHY CHEST WITH CONTRAST TECHNIQUE: Multidetector CT imaging of the chest was performed using the standard protocol during bolus administration of intravenous contrast. Multiplanar CT image reconstructions and MIPs were obtained to evaluate the vascular anatomy. CONTRAST:  75 mL Isovue COMPARISON:  None. FINDINGS: Cardiovascular: No filling defects within the pulmonary arteries to suggest acute pulmonary embolism. No acute findings of the aorta or great vessels. No pericardial fluid. No acute findings of the aorta great vessels.  No pericardial fluid. Mediastinum/Nodes: No axillary supraclavicular adenopathy. No mediastinal or hilar adenopathy. No pericardial fluid esophagus normal Lungs/Pleura: No pulmonary infarction. Mild basilar atelectasis. No pneumonia. No pneumothorax. Upper Abdomen: Limited view of the liver, kidneys, pancreas are unremarkable. Normal adrenal glands. Musculoskeletal: No aggressive osseous lesion. Review of the MIP images confirms the above findings. IMPRESSION: 1. No evidence acute pulmonary embolism. 2. No acute pulmonary parenchymal findings. Electronically Signed   By: Suzy Bouchard M.D.   On: 05/19/2017 18:21     EKG: Independently reviewed. NSR, non specific intraventricular block.   Assessment/Plan Active Problems:   Chest pain   1-CP  -sxs fairly typical, though recurrent issue -HEART Score 3-4  -trop neg x1 -EKG stable -normal stress test 02/2014 -ASA  -cycle CEs overnight  -follow  2-Psych  -? Contributing to above -s/p ativan in ER   3-Asthma  -stable  -cont home regimen    DVT prophylaxis: ppx lovenox  Code Status: Full Code   Family Communication: Family at bedside   Disposition Plan: Pending further evaluation   Consults called: none   Admission status: Obs     Deneise Lever MD [facility] Pager [phone]  If  7PM-7AM, please contact night-coverage [email or web address] Password [facility]  05/19/2017, 7:15 PM

## 2017-05-20 ENCOUNTER — Encounter (HOSPITAL_COMMUNITY)
Admission: EM | Disposition: A | Discharge status: Home / Self Care | Payer: Self-pay | Source: Home / Self Care | Attending: Cardiology

## 2017-05-20 ENCOUNTER — Encounter (HOSPITAL_COMMUNITY): Payer: Self-pay

## 2017-05-20 DIAGNOSIS — I129 Hypertensive chronic kidney disease with stage 1 through stage 4 chronic kidney disease, or unspecified chronic kidney disease: Secondary | ICD-10-CM | POA: Diagnosis not present

## 2017-05-20 DIAGNOSIS — F329 Major depressive disorder, single episode, unspecified: Secondary | ICD-10-CM | POA: Diagnosis present

## 2017-05-20 DIAGNOSIS — D693 Immune thrombocytopenic purpura: Secondary | ICD-10-CM

## 2017-05-20 DIAGNOSIS — M109 Gout, unspecified: Secondary | ICD-10-CM | POA: Diagnosis not present

## 2017-05-20 DIAGNOSIS — I252 Old myocardial infarction: Secondary | ICD-10-CM | POA: Diagnosis not present

## 2017-05-20 DIAGNOSIS — Z8673 Personal history of transient ischemic attack (TIA), and cerebral infarction without residual deficits: Secondary | ICD-10-CM | POA: Diagnosis not present

## 2017-05-20 DIAGNOSIS — Z888 Allergy status to other drugs, medicaments and biological substances status: Secondary | ICD-10-CM | POA: Diagnosis not present

## 2017-05-20 DIAGNOSIS — Z87891 Personal history of nicotine dependence: Secondary | ICD-10-CM | POA: Diagnosis not present

## 2017-05-20 DIAGNOSIS — F411 Generalized anxiety disorder: Secondary | ICD-10-CM | POA: Diagnosis present

## 2017-05-20 DIAGNOSIS — R69 Illness, unspecified: Secondary | ICD-10-CM | POA: Diagnosis not present

## 2017-05-20 DIAGNOSIS — Z9049 Acquired absence of other specified parts of digestive tract: Secondary | ICD-10-CM | POA: Diagnosis not present

## 2017-05-20 DIAGNOSIS — I2510 Atherosclerotic heart disease of native coronary artery without angina pectoris: Secondary | ICD-10-CM

## 2017-05-20 DIAGNOSIS — Z79899 Other long term (current) drug therapy: Secondary | ICD-10-CM | POA: Diagnosis not present

## 2017-05-20 DIAGNOSIS — G8929 Other chronic pain: Secondary | ICD-10-CM | POA: Diagnosis present

## 2017-05-20 DIAGNOSIS — E669 Obesity, unspecified: Secondary | ICD-10-CM | POA: Diagnosis not present

## 2017-05-20 DIAGNOSIS — E1122 Type 2 diabetes mellitus with diabetic chronic kidney disease: Secondary | ICD-10-CM | POA: Diagnosis not present

## 2017-05-20 DIAGNOSIS — E785 Hyperlipidemia, unspecified: Secondary | ICD-10-CM | POA: Diagnosis not present

## 2017-05-20 DIAGNOSIS — F418 Other specified anxiety disorders: Secondary | ICD-10-CM | POA: Diagnosis present

## 2017-05-20 DIAGNOSIS — Z91038 Other insect allergy status: Secondary | ICD-10-CM | POA: Diagnosis not present

## 2017-05-20 DIAGNOSIS — N183 Chronic kidney disease, stage 3 (moderate): Secondary | ICD-10-CM | POA: Diagnosis not present

## 2017-05-20 DIAGNOSIS — I10 Essential (primary) hypertension: Secondary | ICD-10-CM | POA: Diagnosis not present

## 2017-05-20 DIAGNOSIS — I208 Other forms of angina pectoris: Secondary | ICD-10-CM

## 2017-05-20 DIAGNOSIS — R079 Chest pain, unspecified: Secondary | ICD-10-CM | POA: Diagnosis present

## 2017-05-20 DIAGNOSIS — E782 Mixed hyperlipidemia: Secondary | ICD-10-CM | POA: Diagnosis present

## 2017-05-20 DIAGNOSIS — I214 Non-ST elevation (NSTEMI) myocardial infarction: Secondary | ICD-10-CM | POA: Diagnosis not present

## 2017-05-20 DIAGNOSIS — I200 Unstable angina: Secondary | ICD-10-CM | POA: Diagnosis not present

## 2017-05-20 DIAGNOSIS — K219 Gastro-esophageal reflux disease without esophagitis: Secondary | ICD-10-CM | POA: Diagnosis not present

## 2017-05-20 DIAGNOSIS — Z9081 Acquired absence of spleen: Secondary | ICD-10-CM | POA: Diagnosis not present

## 2017-05-20 DIAGNOSIS — Z86711 Personal history of pulmonary embolism: Secondary | ICD-10-CM | POA: Diagnosis not present

## 2017-05-20 HISTORY — PX: LEFT HEART CATH AND CORONARY ANGIOGRAPHY: CATH118249

## 2017-05-20 LAB — APTT: aPTT: 29 seconds (ref 24–36)

## 2017-05-20 LAB — CREATININE, SERUM
CREATININE: 1.78 mg/dL — AB (ref 0.44–1.00)
GFR, EST AFRICAN AMERICAN: 37 mL/min — AB (ref >60)
GFR, EST NON AFRICAN AMERICAN: 32 mL/min — AB (ref >60)

## 2017-05-20 LAB — COMPREHENSIVE METABOLIC PANEL
ALBUMIN: 3.5 g/dL (ref 3.5–5.0)
ALK PHOS: 83 U/L (ref 38–126)
ALT: 16 U/L (ref 14–54)
AST: 31 U/L (ref 15–41)
Anion gap: 9 (ref 5–15)
BUN: 29 mg/dL — ABNORMAL HIGH (ref 6–20)
CALCIUM: 8.8 mg/dL — AB (ref 8.9–10.3)
CO2: 24 mmol/L (ref 22–32)
CREATININE: 1.58 mg/dL — AB (ref 0.44–1.00)
Chloride: 104 mmol/L (ref 101–111)
GFR calc non Af Amer: 37 mL/min — ABNORMAL LOW (ref >60)
GFR, EST AFRICAN AMERICAN: 43 mL/min — AB (ref >60)
GLUCOSE: 90 mg/dL (ref 65–99)
Potassium: 4.3 mmol/L (ref 3.5–5.1)
SODIUM: 137 mmol/L (ref 135–145)
Total Bilirubin: 0.6 mg/dL (ref 0.3–1.2)
Total Protein: 6.7 g/dL (ref 6.5–8.1)

## 2017-05-20 LAB — HEMOGLOBIN A1C
HEMOGLOBIN A1C: 5.6 % (ref 4.8–5.6)
Mean Plasma Glucose: 114.02 mg/dL

## 2017-05-20 LAB — CBC
HCT: 41 % (ref 36.0–46.0)
HEMATOCRIT: 39.9 % (ref 36.0–46.0)
HEMOGLOBIN: 12.6 g/dL (ref 12.0–15.0)
Hemoglobin: 13.3 g/dL (ref 12.0–15.0)
MCH: 29 pg (ref 26.0–34.0)
MCH: 27.9 pg (ref 26.0–34.0)
MCHC: 32.4 g/dL (ref 30.0–36.0)
MCHC: 31.6 g/dL (ref 30.0–36.0)
MCV: 89.3 fL (ref 78.0–100.0)
MCV: 88.5 fL (ref 78.0–100.0)
PLATELETS: 209 10*3/uL (ref 150–400)
Platelets: 200 10*3/uL (ref 150–400)
RBC: 4.59 MIL/uL (ref 3.87–5.11)
RBC: 4.51 MIL/uL (ref 3.87–5.11)
RDW: 14.2 % (ref 11.5–15.5)
RDW: 14.1 % (ref 11.5–15.5)
WBC: 9 10*3/uL (ref 4.0–10.5)
WBC: 8.4 10*3/uL (ref 4.0–10.5)

## 2017-05-20 LAB — MRSA PCR SCREENING: MRSA BY PCR: NEGATIVE

## 2017-05-20 LAB — TROPONIN I
Troponin I: 1.69 ng/mL (ref <0.03)
Troponin I: 2.28 ng/mL (ref <0.03)

## 2017-05-20 LAB — HEPARIN LEVEL (UNFRACTIONATED): HEPARIN UNFRACTIONATED: 0.35 [IU]/mL (ref 0.30–0.70)

## 2017-05-20 LAB — PROTIME-INR
INR: 1.02
Prothrombin Time: 13.4 seconds (ref 11.4–15.2)

## 2017-05-20 SURGERY — LEFT HEART CATH AND CORONARY ANGIOGRAPHY
Anesthesia: LOCAL

## 2017-05-20 MED ORDER — MIDAZOLAM HCL 2 MG/2ML IJ SOLN
INTRAMUSCULAR | Status: AC
  Filled 2017-05-20: qty 2

## 2017-05-20 MED ORDER — LIDOCAINE HCL (PF) 1 % IJ SOLN
INTRAMUSCULAR | Status: DC | PRN
  Administered 2017-05-20: 2 mL via INTRADERMAL

## 2017-05-20 MED ORDER — SODIUM CHLORIDE 0.9 % IV SOLN: 250.0000 mL | INTRAVENOUS | Status: DC | PRN

## 2017-05-20 MED ORDER — SODIUM CHLORIDE 0.9 % WEIGHT BASED INFUSION
1.0000 mL/kg/h | INTRAVENOUS | Status: AC
  Administered 2017-05-20: 1 mL/kg/h via INTRAVENOUS

## 2017-05-20 MED ORDER — SODIUM CHLORIDE 0.9% FLUSH
3.0000 mL | Freq: Two times a day (BID) | INTRAVENOUS | Status: DC
  Administered 2017-05-21 – 2017-05-22 (×2): 3 mL via INTRAVENOUS

## 2017-05-20 MED ORDER — HEPARIN SODIUM (PORCINE) 1000 UNIT/ML IJ SOLN
INTRAMUSCULAR | Status: DC | PRN
  Administered 2017-05-20: 4000 [IU] via INTRAVENOUS

## 2017-05-20 MED ORDER — HEPARIN (PORCINE) IN NACL 2-0.9 UNIT/ML-% IJ SOLN
INTRAMUSCULAR | Status: AC | PRN
  Administered 2017-05-20: 1000 mL via INTRA_ARTERIAL

## 2017-05-20 MED ORDER — SODIUM CHLORIDE 0.9% FLUSH: 3.0000 mL | INTRAVENOUS | Status: DC | PRN

## 2017-05-20 MED ORDER — ALBUTEROL SULFATE (2.5 MG/3ML) 0.083% IN NEBU: 3.0000 mL | INHALATION_SOLUTION | RESPIRATORY_TRACT | Status: DC | PRN

## 2017-05-20 MED ORDER — METOPROLOL TARTRATE 12.5 MG HALF TABLET
12.5000 mg | ORAL_TABLET | Freq: Two times a day (BID) | ORAL | Status: DC
  Administered 2017-05-20 – 2017-05-22 (×4): 12.5 mg via ORAL
  Filled 2017-05-20 (×5): qty 1

## 2017-05-20 MED ORDER — FENTANYL CITRATE (PF) 100 MCG/2ML IJ SOLN
INTRAMUSCULAR | Status: DC | PRN
  Administered 2017-05-20 (×2): 25 ug via INTRAVENOUS

## 2017-05-20 MED ORDER — HEPARIN SODIUM (PORCINE) 1000 UNIT/ML IJ SOLN
INTRAMUSCULAR | Status: AC
  Filled 2017-05-20: qty 1

## 2017-05-20 MED ORDER — HEPARIN BOLUS VIA INFUSION
4000.0000 [IU] | Freq: Once | INTRAVENOUS | Status: DC
  Filled 2017-05-20: qty 4000

## 2017-05-20 MED ORDER — HEPARIN (PORCINE) IN NACL 100-0.45 UNIT/ML-% IJ SOLN
1000.0000 [IU]/h | INTRAMUSCULAR | Status: DC
  Administered 2017-05-20: 1000 [IU]/h via INTRAVENOUS
  Filled 2017-05-20: qty 250

## 2017-05-20 MED ORDER — ENOXAPARIN SODIUM 40 MG/0.4ML ~~LOC~~ SOLN
40.0000 mg | SUBCUTANEOUS | Status: DC
  Administered 2017-05-21 – 2017-05-22 (×2): 40 mg via SUBCUTANEOUS
  Filled 2017-05-20 (×2): qty 0.4

## 2017-05-20 MED ORDER — MIDAZOLAM HCL 2 MG/2ML IJ SOLN
INTRAMUSCULAR | Status: DC | PRN
  Administered 2017-05-20 (×2): 1 mg via INTRAVENOUS

## 2017-05-20 MED ORDER — IOPAMIDOL (ISOVUE-370) INJECTION 76%
INTRAVENOUS | Status: DC | PRN
  Administered 2017-05-20: 65 mL via INTRAVENOUS

## 2017-05-20 MED ORDER — VERAPAMIL HCL 2.5 MG/ML IV SOLN
INTRAVENOUS | Status: AC
  Filled 2017-05-20: qty 2

## 2017-05-20 MED ORDER — HEPARIN (PORCINE) IN NACL 2-0.9 UNIT/ML-% IJ SOLN
INTRAMUSCULAR | Status: AC
  Filled 2017-05-20: qty 1000

## 2017-05-20 MED ORDER — ATORVASTATIN CALCIUM 80 MG PO TABS
80.0000 mg | ORAL_TABLET | Freq: Every day | ORAL | Status: DC
  Administered 2017-05-20 – 2017-05-21 (×2): 80 mg via ORAL
  Filled 2017-05-20: qty 1

## 2017-05-20 MED ORDER — LIDOCAINE HCL 1 % IJ SOLN
INTRAMUSCULAR | Status: AC
  Filled 2017-05-20: qty 20

## 2017-05-20 MED ORDER — HEPARIN BOLUS VIA INFUSION
2000.0000 [IU] | Freq: Once | INTRAVENOUS | Status: DC
  Filled 2017-05-20: qty 2000

## 2017-05-20 MED ORDER — IOPAMIDOL (ISOVUE-370) INJECTION 76%
INTRAVENOUS | Status: AC
  Filled 2017-05-20: qty 100

## 2017-05-20 MED ORDER — FENTANYL CITRATE (PF) 100 MCG/2ML IJ SOLN
INTRAMUSCULAR | Status: AC
  Filled 2017-05-20: qty 2

## 2017-05-20 MED ORDER — HYDROMORPHONE HCL 1 MG/ML IJ SOLN
0.5000 mg | INTRAMUSCULAR | Status: DC | PRN
  Administered 2017-05-20: 0.5 mg via INTRAVENOUS
  Filled 2017-05-20: qty 1

## 2017-05-20 SURGICAL SUPPLY — 10 items
CATH 5FR JL3.5 JR4 ANG PIG MP (CATHETERS) ×1 IMPLANT
DEVICE RAD COMP TR BAND LRG (VASCULAR PRODUCTS) ×1 IMPLANT
GLIDESHEATH SLEND SS 6F .021 (SHEATH) ×1 IMPLANT
GUIDEWIRE INQWIRE 1.5J.035X260 (WIRE) IMPLANT
INQWIRE 1.5J .035X260CM (WIRE) ×2
KIT HEART LEFT (KITS) ×2 IMPLANT
PACK CARDIAC CATHETERIZATION (CUSTOM PROCEDURE TRAY) ×2 IMPLANT
SYR MEDRAD MARK V 150ML (SYRINGE) ×2 IMPLANT
TRANSDUCER W/STOPCOCK (MISCELLANEOUS) ×2 IMPLANT
TUBING CIL FLEX 10 FLL-RA (TUBING) ×2 IMPLANT

## 2017-05-20 NOTE — Progress Notes (Signed)
Still complaining of headache, gave 0.5 dilaudid ordered by Doctor.

## 2017-05-20 NOTE — Progress Notes (Signed)
ANTICOAGULATION CONSULT NOTE - Follow Up Consult  Pharmacy Consult for Heparin Indication: chest pain/ACS  Allergies  Allergen Reactions  . Brintellix [Vortioxetine] Itching  . Yellow Jacket Venom Swelling  . Adhesive [Tape] Rash    Please use paper tape  . Zoloft [Sertraline Hcl] Diarrhea   Patient Measurements: Height: 5\' 5"  (165.1 cm) Weight: 183 lb 1.6 oz (83.1 kg) IBW/kg (Calculated) : 57 HEPARIN DW (KG): 74.8   Vital Signs: Temp: 98.1 F (36.7 C) (08/23 0735) Temp Source: Oral (08/22 2040) BP: 120/92 (08/23 0515) Pulse Rate: 70 (08/23 0530)  Labs:  Recent Labs  05/19/17 1557 05/19/17 1912 05/19/17 2216 05/20/17 0029 05/20/17 0433 05/20/17 0739  HGB 13.4  --   --   --  13.3  --   HCT 41.0  --   --   --  41.0  --   PLT 204  --   --   --  209  --   APTT  --   --   --  29  --   --   LABPROT  --   --   --  13.4  --   --   INR  --   --   --  1.02  --   --   HEPARINUNFRC  --   --   --   --   --  0.35  CREATININE 1.78*  --   --   --  1.58*  --   TROPONINI  --  0.11* 0.55*  --  1.69*  --    Estimated Creatinine Clearance: 44.8 mL/min (A) (by C-G formula based on SCr of 1.58 mg/dL (H)).  Medications:  Prescriptions Prior to Admission  Medication Sig Dispense Refill Last Dose  . escitalopram (LEXAPRO) 20 MG tablet Take 20 mg by mouth daily.    05/19/2017 at Unknown time  . ipratropium-albuterol (DUONEB) 0.5-2.5 (3) MG/3ML SOLN Take 3 mLs by nebulization 3 (three) times daily as needed (shorntess of breath/wheezing). (Patient taking differently: Take 3 mLs by nebulization 3 (three) times daily as needed (shortness of breath/wheezing). ) 360 mL 0 unknown  . PROAIR RESPICLICK 517 (90 BASE) MCG/ACT AEPB Inhale 2 puffs into the lungs every 4 (four) hours as needed (Shortness of breath).   3 unknown   Assessment: Okay for Protocol, Heparin level @ goal.  CBC stable.  Elevated Troponin.  Likely transfer to [facility] for further workup.  Goal of Therapy:  Heparin level  0.3-0.7 units/ml Monitor platelets by anticoagulation protocol: Yes   Plan:  Continue Heparin @ 1000 units/hr. Continue Daily Heparin Level and CBC Monitor for signs and symptoms of bleeding.  F/U Cardiac plans.  Biagio Quint R 05/20/2017,8:37 AM

## 2017-05-20 NOTE — Interval H&P Note (Signed)
History and Physical Interval Note:  05/20/2017 5:00 PM  Summer Cook  has presented today for surgery, with the diagnosis of nstemi  The various methods of treatment have been discussed with the patient and family. After consideration of risks, benefits and other options for treatment, the patient has consented to  Procedure(s): LEFT HEART CATH AND CORONARY ANGIOGRAPHY (N/A) as a surgical intervention .  The patient's history has been reviewed, patient examined, no change in status, stable for surgery.  I have reviewed the patient's chart and labs.  Questions were answered to the patient's satisfaction.    Cath Lab Visit (complete for each Cath Lab visit)  Clinical Evaluation Leading to the Procedure:   ACS: Yes.    Non-ACS:    Anginal Classification: CCS IV  Anti-ischemic medical therapy: Maximal Therapy (2 or more classes of medications)  Non-Invasive Test Results: No non-invasive testing performed  Prior CABG: No previous CABG       Summer Cook [facility] 05/20/2017 5:00 PM

## 2017-05-20 NOTE — Progress Notes (Signed)
CRITICAL VALUE ALERT  Critical Value: Troponin 1.69  Date & Time Notied:  05/20/17 0535  Provider Notified: Darrick Meigs  Orders Received/Actions taken: MD called back to verify plan that is already in place and to stay with plan. [facility]

## 2017-05-20 NOTE — Consult Note (Addendum)
Cardiology Consultation:   Patient ID: Summer Cook; [id number]; 1965-10-19   Admit date: 05/19/2017 Date of Consult: 05/20/2017  Primary Care Provider: Redmond School, MD Primary Cardiologist: Domenic Polite  Patient Profile:   Summer Cook is a 51 y.o. 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 Thereasa Solo.    History of Present Illness:   [name] was in her usual state of health when at rest she began to have substernal chest pain, non-radiating, which lasted approximately 15 minutes and went away on its own. About an hour and a half later, she reported recurrence of chest pain, described as an "achey brick" in the center of her chest. She was unable to take a deep breath. She denies diaphoresis, but had some low grade nausea. Pain continued to become worse, therefore presented to ER.   On arrival to ER, BP 131/93, HR  83, O2 Sat 98%, afebrile. Troponin 0.11 and 1.69. EKG NSR with intraventricular conduction delay, prior anterior infarct rate of 86 bpm,. This am EKG demonstrates NSR with intraventricular conduction delay, with evidence of inferior Q waves. Creatinine 1.58. K+ 4.3. Hgb 13.3 and Hct 41.0. CXR did not show evidence of CHF or pneumonia. CT negative for PE. She was treated with NTG sublingual, ASA and ativan. She has a history of DM, HTN< PE, CKD, obesity.   She has been started on heparin gtt,NTG gtt,  ASA, metoprolol 12.5 mg BID,and statin. Plans for transfer to [facility] were arranged by night shift Hospitalist, but no beds were available. We are asked for cardiology recommendations.    She is currently chest pain free but is complaining of severe headache. She is breathing better now.   Past Medical History:  Diagnosis Date  . Allergy   . Arthritis   . Asthma   . Candida esophagitis  ([place])   . Chronic chest pain   . Depression   . Essential hypertension, benign   . GERD (gastroesophageal reflux disease)   . Gout   . Hemorrhoids   . 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  Last was Prednisone '40mg'$ ; 2 wk in 10/2012.  She also was on Prednisone bridged with Cellcept briefly but stopped due to lack of insurance.   . Normal cardiac stress test 02/2014  . Pulmonary embolism ([place]) 04/2012  . Renal insufficiency   . Serrated adenoma of colon   . Steroid-induced diabetes ([place])   . Stroke ([place])   . Thrush, oral 05/29/2011    Past Surgical History:  Procedure Laterality Date  . BONE MARROW BIOPSY    . CARPAL TUNNEL RELEASE    . CARPAL TUNNEL RELEASE Left 05/20/2016   Procedure: CARPAL TUNNEL RELEASE;  Surgeon: Carole Civil, MD;  Location: [facility];  Service: Orthopedics;  Laterality: Left;  . CARPAL TUNNEL RELEASE Right 07/16/2016   Procedure: RIGHT CARPAL TUNNEL RELEASE;  Surgeon: Carole Civil, MD;  Location: [facility] ORS;  Service: Orthopedics;  Laterality: Right;  . CATARACT EXTRACTION    . CHOLECYSTECTOMY  2008  . COLONOSCOPY WITH ESOPHAGOGASTRODUODENOSCOPY (EGD) N/A 01/04/2013   Dr. Gala Romney: esophageal plaques with +KOH, hh. Gastric antrum abnormal, bx reactive gastropathy. Anal canal hemorrhoids, colonic tics, normal TI, single polyp (sessile serrated tubular adenoma). Next TCS  12/2017  . 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 TEE WITHOUT CARDIOVERSION N/A 01/03/2016   Procedure: TRANSESOPHAGEAL ECHOCARDIOGRAM (TEE) WITH PROPOFOL;  Surgeon: Satira Sark, MD;  Location: [facility] SU[facility];  Service: Cardiovascular;  Laterality: N/A;     Home Medications:  Prior to Admission medications   Medication Sig Start Date End Date Taking? Authorizing Provider  escitalopram (LEXAPRO) 20 MG tablet Take 20 mg by mouth daily.  05/03/17  Yes [provider]    ipratropium-albuterol (DUONEB) 0.5-2.5 (3) MG/3ML SOLN Take 3 mLs by nebulization 3 (three) times daily as needed (shorntess of breath/wheezing). Patient taking differently: Take 3 mLs by nebulization 3 (three) times daily as needed (shortness of breath/wheezing).  02/18/15  Yes Kathie Dike, MD  PROAIR RESPICLICK 272 (90 BASE) MCG/ACT AEPB Inhale 2 puffs into the lungs every 4 (four) hours as needed (Shortness of breath).  12/27/14  Yes [provider]    Inpatient Medications: Scheduled Meds: . aspirin EC  325 mg Oral Daily  . escitalopram  20 mg Oral Daily   Continuous Infusions: . heparin 1,000 Units/hr (05/20/17 0049)  . nitroGLYCERIN 10 mcg/min (05/20/17 0134)   PRN Meds: acetaminophen, albuterol, ipratropium-albuterol, morphine injection, ondansetron (ZOFRAN) IV  Allergies:    Allergies  Allergen Reactions  . Brintellix [Vortioxetine] Itching  . [place] Venom Swelling  . Adhesive [Tape] Rash    Please use paper tape  . Zoloft [Sertraline Hcl] Diarrhea    Social History:   Social History   Social History  . Marital status: Divorced    Spouse name: N/A  . Number of children: 1  . Years of education: N/A   Occupational History  .  Unemployed    was a Educational psychologist; last worked 2012.    Social History Main Topics  . Smoking status: Former Smoker    Years: 0.00    Types: Cigarettes    Quit date: 02/14/2009  . Smokeless tobacco: Never Used  . Alcohol use No  . Drug use: No  . Sexual activity: No   Other Topics Concern  . Not on file   Social History Narrative   Lives with her daughter and aunt in a one story home.  Has 1 daughter.  On disability.  Did work as a Optometrist and bus Geophysicist/field seismologist.  Education: some college.     Family History:    Family History  Problem Relation Age of Onset  . Cervical cancer Mother   . Lung cancer Father   . Colon polyps Father        ???  . Pneumonia Brother   . Colon cancer Paternal Grandmother 88  . AAA  (abdominal aortic aneurysm) Paternal Grandmother   . Breast cancer Paternal Grandmother   . Colon cancer Paternal Grandfather 65  . Barrett's esophagus Paternal Aunt   . Stomach cancer Neg Hx   . Pancreatic cancer Neg Hx      ROS:  Please see the history of present illness.  ROS  All other ROS reviewed and negative.     Physical Exam/Data:   Vitals:   05/20/17 0500 05/20/17 0515 05/20/17 0530 05/20/17 0735  BP: (!) 120/94 (!) 120/92    Pulse: 63 64 70   Resp: '16 16 17   '$ Temp:    98.1 F (36.7 C)  TempSrc:      SpO2: 93% 93% 93%   Weight:      Height:        Intake/Output Summary (Last 24 hours)  at 05/20/17 0805 Last data filed at 05/20/17 0200  Gross per 24 hour  Intake           240.43 ml  Output              100 ml  Net           140.43 ml   Filed Weights   05/19/17 1543 05/19/17 2040  Weight: 190 lb (86.2 kg) 183 lb 1.6 oz (83.1 kg)   Body mass index is 30.47 kg/m.  General:  Well nourished, well developed, in no acute distress, complaining of a headache.  HEENT: normal Lymph: no adenopathy Neck: no JVD Endocrine:  No thryomegaly Vascular: No carotid bruits; FA pulses 2+ bilaterally without bruits  Cardiac:  normal S1, S2; RRR; no murmur  Lungs:  Essentially CTA, with mild crackles in the bases L>R.   Abd: soft, nontender, no hepatomegaly  Ext: no edema Musculoskeletal:  No deformities, BUE and BLE strength normal and equal Skin: warm and dry  Neuro:  CNs 2-12 intact, no focal abnormalities noted Psych:  Flat affect   EKG:  The EKG was personally reviewed and demonstrates:  SR with IVCD, new Q waves inferiorly.  Telemetry:  Telemetry was personally reviewed and demonstrates:  NSR   Relevant CV Studies: Echocardiogram 02/28/2013  Left ventricle: The cavity size was normal. Wall thickness was normal. Systolic function was normal. The estimated ejection fraction was in the range of 55% to 60%. Wall motion was normal; there were no regional wall  motion abnormalities. Doppler parameters are consistent with abnormal left ventricular relaxation (grade 1 diastolic dysfunction). - Mitral valve: Mild regurgitation.  Laboratory Data:  Chemistry Recent Labs Lab 05/19/17 1557 05/20/17 0433  NA 139 137  K 3.9 4.3  CL 107 104  CO2 25 24  GLUCOSE 108* 90  BUN 28* 29*  CREATININE 1.78* 1.58*  CALCIUM 8.8* 8.8*  GFRNONAA 32* 37*  GFRAA 37* 43*  ANIONGAP 7 9     Recent Labs Lab 05/20/17 0433  PROT 6.7  ALBUMIN 3.5  AST 31  ALT 16  ALKPHOS 83  BILITOT 0.6   Hematology Recent Labs Lab 05/19/17 1557 05/20/17 0433  WBC 7.1 9.0  RBC 4.60 4.59  HGB 13.4 13.3  HCT 41.0 41.0  MCV 89.1 89.3  MCH 29.1 29.0  MCHC 32.7 32.4  RDW 14.0 14.2  PLT 204 209   Cardiac Enzymes Recent Labs Lab 05/19/17 1912 05/19/17 2216 05/20/17 0433  TROPONINI 0.11* 0.55* 1.69*    Recent Labs Lab 05/19/17 1609  TROPIPOC 0.00     DDimer  Recent Labs Lab 05/19/17 1616  DDIMER 0.55*    Radiology/Studies:  Dg Chest 2 View  Result Date: 05/19/2017 CLINICAL DATA:  Chest pain for 2 hours. EXAM: CHEST  2 VIEW COMPARISON:  01/14/2017 FINDINGS: The cardiac silhouette, mediastinal and hilar contours are within normal limits and stable. The lungs are clear. No pleural effusion. The bony thorax is intact. Remote compression deformities are noted involving T12 and T10. IMPRESSION: No acute cardiopulmonary findings. Electronically Signed   By: Marijo Sanes M.D.   On: 05/19/2017 17:31   Ct Angio Chest Pe W And/or Wo Contrast  Result Date: 05/19/2017 CLINICAL DATA:  Chest pain.  History of pulmonary embolism EXAM: CT ANGIOGRAPHY CHEST WITH CONTRAST TECHNIQUE: Multidetector CT imaging of the chest was performed using the standard protocol during bolus administration of intravenous contrast. Multiplanar CT image reconstructions and MIPs were obtained to evaluate the vascular anatomy.  CONTRAST:  75 mL Isovue COMPARISON:  None. FINDINGS:  Cardiovascular: No filling defects within the pulmonary arteries to suggest acute pulmonary embolism. No acute findings of the aorta or great vessels. No pericardial fluid. No acute findings of the aorta great vessels.  No pericardial fluid. Mediastinum/Nodes: No axillary supraclavicular adenopathy. No mediastinal or hilar adenopathy. No pericardial fluid esophagus normal Lungs/Pleura: No pulmonary infarction. Mild basilar atelectasis. No pneumonia. No pneumothorax. Upper Abdomen: Limited view of the liver, kidneys, pancreas are unremarkable. Normal adrenal glands. Musculoskeletal: No aggressive osseous lesion. Review of the MIP images confirms the above findings. IMPRESSION: 1. No evidence acute pulmonary embolism. 2. No acute pulmonary parenchymal findings. Electronically Signed   By: Suzy Bouchard M.D.   On: 05/19/2017 18:21    Assessment and Plan:   1. NSTEMI: Continue heparin gtt, NTG gtt, BB, ASA. Plan for transfer to [facility] on cardiology service for cardiac cath. She has been seen and examined by Dr. Bronson Ing. The patient understands that risks include but are not limited to stroke (1 in 1000), death (1 in 82), kidney failure [usually temporary] (1 in 500), bleeding (1 in 200), allergic reaction [possibly serious] (1 in 200), and agrees to proceed.  Will provide dilaudid for headache pain from NTG as other meds are not helping.   2. Hx of CVA: Involving the left posterior frontal cortex, Not on anticoagulation or antiplatelet therapy currently.   3, Hx of Hypertension: Not on antihypertensive therapy at home.   4. Hx of Depression: On antidepressants at home.     Signed, Jory Sims, NP  05/20/2017 8:05 AM   The patient was seen and examined, and I agree with the history, physical exam, assessment and plan as documented by K. Suraya Vidrine DNP, with modifications as noted below. I have also personally reviewed all relevant documentation, old records, labs, and both radiographic and  cardiovascular studies. I have also independently interpreted old and new ECG's.  51 yr old woman with h/o CVA, HTN, depression, and ITP hospitalized for chest pain. She was playing video games yesterday morning when she began to experience retrosternal chest pain lasting 15 minutes. It subsided on its own and then 2 hours later she developed it again but this time it did not go away. She drove herself to the ED.  She has had a headache for the past 2 weeks but no prior chest pain. She has felt fatigued for the last several weeks but attributes it to doing more yardwork and housework than usual.  Normal stress test on 03/20/14.  TEE 01/03/16 showed normal LV systolic function and regional wall motion, LVEF 55-60%.  Troponins 0.11-->0.55-->1.69.  ECG shows NSR with nonspecific IVCD with old inferior MI and old anterolateral infarct (other than inferior Q waves, no significant changes from 08/2016.  LDL 149.  Currently on IV heparin and IV nitro 10 mcg/min.  Recommendations: Presentation consistent with NSTEMI. She has CKD stage III with creatinine now 1.58 (1.78 yesterday).  She will need coronary angiography but is at a higher risk for the development of contrast-mediated nephropathy.  Risks and benefits of cardiac catheterization have been discussed with the patient.  These include bleeding, infection, kidney damage, stroke, heart attack, death.  The patient understands these risks and is willing to proceed.  Continue ASA, Lipitor, beta blocker, IV heparin, and IV nitro.  Will make arrangements for transfer to [facility] for coronary angiography.   Time spent: 60 minutes.  Kate Sable, MD, [facility]

## 2017-05-20 NOTE — Progress Notes (Signed)
Patient had elevated troponin. Notified mid-level, who then notified MD that was on call downstairs.  MD came to floor to assess patient and EKG was done.  MD ordered for patient to be transferred to stepdown.  Patient otherwise stable,still has mild headache and mild chest pain.  Report given to ICU nurse, patient called family and notified them of her being transferred to downstairs.

## 2017-05-20 NOTE — Progress Notes (Signed)
Notified Dr. Thereasa Solo troponin increase to 2.28

## 2017-05-20 NOTE — Progress Notes (Addendum)
[place] TEAM 1 - Stepdown/ICU TEAM  Summer Cook  YIR:[id number] DOB: [birth year] DOA: 05/19/2017 PCP: Redmond School, MD    Brief Narrative:  51yo F with a history of DM, HTN, PE, CKD, Obesity, CVA, and ITP who presented w/ an initial 15 mins of non-radiating moderate substernal chest pressure described as a heaviness, occurring while at rest.  It initially resolved on its on, but later in the day recurred and persisted, prompting her to present to the ED.  There pain then did not resolve until she was given nitro in the ED . She had a Myoview 02/2014 that was normal. 2D ECHO 12/2015 was WNL.  Pt also noted to have left [facility] 11/2015 w/ chief complaint of CVA.   In the ED her EKG was unremarkable and troponin negative 1. Chest x-ray within normal limits. D-dimer minimally elevated. CT angiogram negative for PE.  Subjective: Pt denies chest pain at present.  No sob, n/v, abdom pain, or HA.    Assessment & Plan:  Chest pain - elevated troponin Troponin trending upward - night coverage discussed w/ Cards last night and pt started on heparin and nitro IV - EF 55-60% w/ no WMA via TEE April 2017 - appears she will need cardiac cath which necessitates transfer to [facility] (Cards to arrange) - add BB   Recent Labs Lab 05/19/17 1912 05/19/17 2216 05/20/17 0433  TROPONINI 0.11* 0.55* 1.69*    Hx of CVA March 2017 - multifocal B cerebral hemispheres No clear etiology was every found - did have TEE w/o evidence of cardiac thrombus - carotids w/o signif stenosis - no lasting deficits  HTN BP currently well controlled  HLD LDL 149 this admit - is not on tx as outpt - begin high dose lipitor - LFTs normal on CMET today   CKD stage 3 Baseline crt reportedly ~1.9 - presently stable   Recent Labs Lab 05/19/17 1557 05/20/17 0433  CREATININE 1.78* 1.58*    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   DVT prophylaxis: IV heparin  Code Status: FULL CODE Family Communication: no family present at time of exam  Disposition Plan: for transfer to [facility] for cardiac cath at discretion of Cards  Consultants:  [facility] Cards  Procedures: none  Antimicrobials:  none   Objective: Blood pressure (!) 120/92, pulse 70, temperature 98.1 F (36.7 C), resp. rate 17, height 5\' 5"  (1.651 m), weight 83.1 kg (183 lb 1.6 oz), last menstrual period 08/13/2011, SpO2 93 %.  Intake/Output Summary (Last 24 hours) at 05/20/17 0811 Last data filed at 05/20/17 0200  Gross per 24 hour  Intake           240.43 ml  Output              100 ml  Net           140.43 ml   Filed Weights   05/19/17 1543 05/19/17 2040  Weight:  86.2 kg (190 lb) 83.1 kg (183 lb 1.6 oz)    Examination: General: No acute respiratory distress Lungs: Clear to auscultation bilaterally without wheezes or crackles Cardiovascular: Regular rate and rhythm without murmur gallop or rub normal S1 and S2 Abdomen: Nontender, nondistended, soft, bowel sounds positive, no rebound, no ascites, no appreciable mass Extremities: No significant cyanosis, clubbing, or edema bilateral lower extremities  CBC:  Recent Labs Lab 05/19/17 1557 05/20/17 0433  WBC 7.1 9.0  HGB 13.4 13.3  HCT 41.0 41.0  MCV 89.1 89.3  PLT 204 825   Basic Metabolic Panel:  Recent Labs Lab 05/19/17 1557 05/20/17 0433  NA 139 137  K 3.9 4.3  CL 107 104  CO2 25 24  GLUCOSE 108* 90  BUN 28* 29*  CREATININE 1.78* 1.58*  CALCIUM 8.8* 8.8*   GFR: Estimated Creatinine Clearance: 44.8 mL/min (A)  (by C-G formula based on SCr of 1.58 mg/dL (H)).  Liver Function Tests:  Recent Labs Lab 05/20/17 0433  AST 31  ALT 16  ALKPHOS 83  BILITOT 0.6  PROT 6.7  ALBUMIN 3.5    Coagulation Profile:  Recent Labs Lab 05/20/17 0029  INR 1.02    Cardiac Enzymes:  Recent Labs Lab 05/19/17 1912 05/19/17 2216 05/20/17 0433  TROPONINI 0.11* 0.55* 1.69*    HbA1C: Hgb A1c MFr Bld  Date/Time Value Ref Range Status  12/17/2015 04:00 AM 6.2 (H) 4.8 - 5.6 % Final    Comment:    (NOTE)         Pre-diabetes: 5.7 - 6.4         Diabetes: >6.4         Glycemic control for adults with diabetes: <7.0   11/18/2015 04:11 PM 6.3 4.6 - 6.5 % Final    Comment:    Glycemic Control Guidelines for People with Diabetes:Non Diabetic:  <6%Goal of Therapy: <7%Additional Action Suggested:  >8%     CBG:  Recent Labs Lab 05/19/17 2004  GLUCAP 68*    Recent Results (from the past 240 hour(s))  MRSA PCR Screening     Status: None   Collection Time: 05/20/17  1:01 AM  Result Value Ref Range Status   MRSA by PCR NEGATIVE NEGATIVE Final    Comment:        The GeneXpert MRSA Assay (FDA approved for NASAL specimens only), is one component of a comprehensive MRSA colonization surveillance program. It is not intended to diagnose MRSA infection nor to guide or monitor treatment for MRSA infections.      Scheduled Meds: . aspirin EC  325 mg Oral Daily  . escitalopram  20 mg Oral Daily     LOS: 0 days   Cherene Altes, MD [facility] Office  [phone] Pager - Text Page per Amion as per below:  On-Call/Text Page:      [email or web address]      password [email or web address]  If 7PM-7AM, please contact night-coverage [email or web address] Password [email or web address] 05/20/2017, 8:11 AM

## 2017-05-20 NOTE — H&P (View-Only) (Signed)
The patient was seen and examined, and I agree with the history, physical exam, assessment and plan as documented by K. Lawrence DNP, with modifications as noted below. I have also personally reviewed all relevant documentation, old records, labs, and both radiographic and cardiovascular studies. I have also independently interpreted old and new ECG's.  51 yr old woman with h/o CVA, HTN, depression, and ITP hospitalized for chest pain. She was playing video games yesterday morning when she began to experience retrosternal chest pain lasting 15 minutes. It subsided on its own and then 2 hours later she developed it again but this time it did not go away. She drove herself to the ED.  She has had a headache for the past 2 weeks but no prior chest pain. She has felt fatigued for the last several weeks but attributes it to doing more yardwork and housework than usual.  Normal stress test on 03/20/14.  TEE 01/03/16 showed normal LV systolic function and regional wall motion, LVEF 55-60%.  Troponins 0.11-->0.55-->1.69.  ECG shows NSR with nonspecific IVCD with old inferior MI and old anterolateral infarct (other than inferior Q waves, no significant changes from 08/2016.  LDL 149.  Currently on IV heparin and IV nitro 10 mcg/min.  Recommendations: Presentation consistent with NSTEMI. She has CKD stage III with creatinine now 1.58 (1.78 yesterday).  She will need coronary angiography but is at a higher risk for the development of contrast-mediated nephropathy.  Risks and benefits of cardiac catheterization have been discussed with the patient.  These include bleeding, infection, kidney damage, stroke, heart attack, death.  The patient understands these risks and is willing to proceed.  Continue ASA, Lipitor, beta blocker, IV heparin, and IV nitro.  Will make arrangements for transfer to [facility] for coronary angiography.   Time spent: 60 minutes.  Kate Sable, MD,  [facility]  05/20/2017 9:25 AM

## 2017-05-20 NOTE — Progress Notes (Signed)
Patient did receive lunch tray and I did not catch that tray had been given until 1300. Patient also had receive crackers from someone and I take responsibility of not being more observant.

## 2017-05-20 NOTE — Progress Notes (Signed)
ANTICOAGULATION CONSULT NOTE - Preliminary  Pharmacy Consult for Heparin Indication: ACS/STEMI  Allergies  Allergen Reactions  . Brintellix [Vortioxetine] Itching  . Yellow Jacket Venom Swelling  . Adhesive [Tape] Rash    Please use paper tape  . Zoloft [Sertraline Hcl] Diarrhea    Patient Measurements: Height: 5\' 5"  (165.1 cm) Weight: 183 lb 1.6 oz (83.1 kg) IBW/kg (Calculated) : 57 HEPARIN DW (KG): 74.8   Vital Signs: Temp: 97.7 F (36.5 C) (08/22 2040) Temp Source: Oral (08/22 2040) BP: 154/96 (08/22 2040) Pulse Rate: 61 (08/22 2040)  Labs:  Recent Labs  05/19/17 1557 05/19/17 1912 05/19/17 2216  HGB 13.4  --   --   HCT 41.0  --   --   PLT 204  --   --   CREATININE 1.78*  --   --   TROPONINI  --  0.11* 0.55*   Estimated Creatinine Clearance: 39.8 mL/min (A) (by C-G formula based on SCr of 1.78 mg/dL (H)).  Medical History: Past Medical History:  Diagnosis Date  . Allergy   . Arthritis   . Asthma   . Candida esophagitis ([place])   . Chronic chest pain   . Depression   . Essential hypertension, benign   . GERD (gastroesophageal reflux disease)   . Gout   . Hemorrhoids   . 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  Last was Prednisone 40mg ; 2 wk in 10/2012.  She also was on Prednisone bridged with Cellcept briefly but stopped due to lack of insurance.   . Normal cardiac stress test 02/2014  . Pulmonary embolism ([place]) 04/2012  . Renal insufficiency   . Serrated adenoma of colon   . Steroid-induced diabetes ([place])   . Stroke ([place])   . Thrush, oral 05/29/2011    Medications:  Nitroglycerin infusion Heparin 5000 unit subcutaneously x 1 dose at 2100  Assessment: 51 yo female with history sig for chronic CP, CVA, ITP admitted with moderate to severe chest pain today. Troponin increasing. Pharmacy has been asked to provide heparin dosing for ACS/STEMI.  Cardiology consult at [facility] in am.   Goal of Therapy:  Heparin level goal: 0.3-0.7 units/ml Monitor platelets by anticoagulation protocol: Yes   Plan:  No IV heparin bolus due to subcutaneous heparin injection Heparin infusion at 1000 units/hr 6 hour heparin level  Preliminary review of pertinent patient information completed.  Forestine Na clinical pharmacist will complete review during morning rounds to assess the patient and finalize treatment regimen.  Norberto Sorenson, [facility] 05/20/2017,12:14 AM

## 2017-05-20 NOTE — Progress Notes (Signed)
TROPONIN 2.28. DR MCGLUG NOTIFIED

## 2017-05-20 NOTE — Consult Note (Addendum)
The patient was seen and examined, and I agree with the history, physical exam, assessment and plan as documented by K. Lawrence DNP, with modifications as noted below. I have also personally reviewed all relevant documentation, old records, labs, and both radiographic and cardiovascular studies. I have also independently interpreted old and new ECG's.  51 yr old woman with h/o CVA, HTN, depression, and ITP hospitalized for chest pain. She was playing video games yesterday morning when she began to experience retrosternal chest pain lasting 15 minutes. It subsided on its own and then 2 hours later she developed it again but this time it did not go away. She drove herself to the ED.  She has had a headache for the past 2 weeks but no prior chest pain. She has felt fatigued for the last several weeks but attributes it to doing more yardwork and housework than usual.  Normal stress test on 03/20/14.  TEE 01/03/16 showed normal LV systolic function and regional wall motion, LVEF 55-60%.  Troponins 0.11-->0.55-->1.69.  ECG shows NSR with nonspecific IVCD with old inferior MI and old anterolateral infarct (other than inferior Q waves, no significant changes from 08/2016.  LDL 149.  Currently on IV heparin and IV nitro 10 mcg/min.  Recommendations: Presentation consistent with NSTEMI. She has CKD stage III with creatinine now 1.58 (1.78 yesterday).  She will need coronary angiography but is at a higher risk for the development of contrast-mediated nephropathy.  Risks and benefits of cardiac catheterization have been discussed with the patient.  These include bleeding, infection, kidney damage, stroke, heart attack, death.  The patient understands these risks and is willing to proceed.  Continue ASA, Lipitor, beta blocker, IV heparin, and IV nitro.  Will make arrangements for transfer to [facility] for coronary angiography.   Time spent: 60 minutes.  Kate Sable, MD,  [facility]  05/20/2017 9:25 AM

## 2017-05-20 NOTE — Progress Notes (Signed)
Report taken on patient transferred to [facility]. Arrived w/heparin infusing at 1000U/hr and NTG drip at 68mcg to rt hand.

## 2017-05-21 ENCOUNTER — Encounter (HOSPITAL_COMMUNITY): Payer: Self-pay | Admitting: Cardiology

## 2017-05-21 ENCOUNTER — Inpatient Hospital Stay (HOSPITAL_COMMUNITY): Payer: Medicare HMO

## 2017-05-21 DIAGNOSIS — E1122 Type 2 diabetes mellitus with diabetic chronic kidney disease: Secondary | ICD-10-CM | POA: Diagnosis not present

## 2017-05-21 DIAGNOSIS — K219 Gastro-esophageal reflux disease without esophagitis: Secondary | ICD-10-CM | POA: Diagnosis not present

## 2017-05-21 DIAGNOSIS — I252 Old myocardial infarction: Secondary | ICD-10-CM | POA: Diagnosis not present

## 2017-05-21 DIAGNOSIS — I2510 Atherosclerotic heart disease of native coronary artery without angina pectoris: Secondary | ICD-10-CM

## 2017-05-21 DIAGNOSIS — I214 Non-ST elevation (NSTEMI) myocardial infarction: Secondary | ICD-10-CM | POA: Diagnosis not present

## 2017-05-21 DIAGNOSIS — I208 Other forms of angina pectoris: Secondary | ICD-10-CM

## 2017-05-21 DIAGNOSIS — D693 Immune thrombocytopenic purpura: Secondary | ICD-10-CM | POA: Diagnosis not present

## 2017-05-21 DIAGNOSIS — N183 Chronic kidney disease, stage 3 (moderate): Secondary | ICD-10-CM | POA: Diagnosis not present

## 2017-05-21 DIAGNOSIS — E669 Obesity, unspecified: Secondary | ICD-10-CM | POA: Diagnosis not present

## 2017-05-21 DIAGNOSIS — I129 Hypertensive chronic kidney disease with stage 1 through stage 4 chronic kidney disease, or unspecified chronic kidney disease: Secondary | ICD-10-CM | POA: Diagnosis not present

## 2017-05-21 DIAGNOSIS — M109 Gout, unspecified: Secondary | ICD-10-CM | POA: Diagnosis not present

## 2017-05-21 DIAGNOSIS — R69 Illness, unspecified: Secondary | ICD-10-CM | POA: Diagnosis not present

## 2017-05-21 LAB — ECHOCARDIOGRAM COMPLETE
Height: 65 in
Weight: 2929.6 oz

## 2017-05-21 LAB — HIV ANTIBODY (ROUTINE TESTING W REFLEX): HIV SCREEN 4TH GENERATION: NONREACTIVE

## 2017-05-21 LAB — GLUCOSE, CAPILLARY: GLUCOSE-CAPILLARY: 82 mg/dL (ref 65–99)

## 2017-05-21 MED ORDER — CLOPIDOGREL BISULFATE 75 MG PO TABS
75.0000 mg | ORAL_TABLET | Freq: Every day | ORAL | Status: DC
  Administered 2017-05-21 – 2017-05-22 (×2): 75 mg via ORAL
  Filled 2017-05-21: qty 1

## 2017-05-21 MED ORDER — ASPIRIN EC 81 MG PO TBEC
81.0000 mg | DELAYED_RELEASE_TABLET | Freq: Every day | ORAL | Status: DC
  Administered 2017-05-22: 81 mg via ORAL
  Filled 2017-05-21: qty 1

## 2017-05-21 NOTE — Progress Notes (Signed)
Patient requesting medication for nausea. zofran given as ordered as needed for nausea. Will monitor patient. Janye Maynor, Bettina Gavia rN

## 2017-05-21 NOTE — Progress Notes (Signed)
Progress Note  Patient Name: Summer Cook Date of Encounter: 05/21/2017  Primary Cardiologist: Dr. Domenic Polite   Subjective   Still have dull achy pain 3/10 on IV nitro. No dyspnea.   Inpatient Medications    Scheduled Meds: . aspirin EC  325 mg Oral Daily  . atorvastatin  80 mg Oral q1800  . enoxaparin (LOVENOX) injection  40 mg Subcutaneous Q24H  . escitalopram  20 mg Oral Daily  . metoprolol tartrate  12.5 mg Oral BID  . sodium chloride flush  3 mL Intravenous Q12H   Continuous Infusions: . sodium chloride    . nitroGLYCERIN 15 mcg/min (05/21/17 0914)   PRN Meds: sodium chloride, acetaminophen, albuterol, HYDROmorphone (DILAUDID) injection, morphine injection, ondansetron (ZOFRAN) IV, sodium chloride flush   Vital Signs    Vitals:   05/21/17 0000 05/21/17 0400 05/21/17 0744 05/21/17 0900  BP: 106/77 110/78 111/74 119/70  Pulse:   63   Resp: 18 18 16  (!) 21  Temp:  98.2 F (36.8 C) 98.2 F (36.8 C)   TempSrc:  Oral Oral   SpO2: 93% 92% 95% 95%  Weight:      Height:        Intake/Output Summary (Last 24 hours) at 05/21/17 1004 Last data filed at 05/21/17 0600  Gross per 24 hour  Intake             1140 ml  Output             2050 ml  Net             -910 ml   Filed Weights   05/19/17 1543 05/19/17 2040  Weight: 190 lb (86.2 kg) 183 lb 1.6 oz (83.1 kg)    Telemetry    SR at rate of 60s- Personally Reviewed  ECG    None today   Physical Exam   GEN: No acute distress.   Neck: No JVD Cardiac: RRR, no murmurs, rubs, or gallops. R radial cath site without hematoma.  Respiratory: Clear to auscultation bilaterally. GI: Soft, nontender, non-distended  MS: No edema; No deformity. Neuro:  Nonfocal  Psych: Normal affect   Labs    Chemistry Recent Labs Lab 05/19/17 1557 05/20/17 0433 05/20/17 1902  NA 139 137  --   K 3.9 4.3  --   CL 107 104  --   CO2 25 24  --   GLUCOSE 108* 90  --   BUN 28* 29*  --   CREATININE 1.78* 1.58* 1.78*    CALCIUM 8.8* 8.8*  --   PROT  --  6.7  --   ALBUMIN  --  3.5  --   AST  --  31  --   ALT  --  16  --   ALKPHOS  --  83  --   BILITOT  --  0.6  --   GFRNONAA 32* 37* 32*  GFRAA 37* 43* 37*  ANIONGAP 7 9  --      Hematology Recent Labs Lab 05/19/17 1557 05/20/17 0433 05/20/17 1902  WBC 7.1 9.0 8.4  RBC 4.60 4.59 4.51  HGB 13.4 13.3 12.6  HCT 41.0 41.0 39.9  MCV 89.1 89.3 88.5  MCH 29.1 29.0 27.9  MCHC 32.7 32.4 31.6  RDW 14.0 14.2 14.1  PLT 204 209 200    Cardiac Enzymes Recent Labs Lab 05/19/17 1912 05/19/17 2216 05/20/17 0433 05/20/17 1040  TROPONINI 0.11* 0.55* 1.69* 2.28*    Recent Labs Lab 05/19/17 1609  TROPIPOC 0.00     BNPNo results for input(s): BNP, PROBNP in the last 168 hours.   DDimer  Recent Labs Lab 05/19/17 1616  DDIMER 0.55*     Radiology    Dg Chest 2 View  Result Date: 05/19/2017 CLINICAL DATA:  Chest pain for 2 hours. EXAM: CHEST  2 VIEW COMPARISON:  01/14/2017 FINDINGS: The cardiac silhouette, mediastinal and hilar contours are within normal limits and stable. The lungs are clear. No pleural effusion. The bony thorax is intact. Remote compression deformities are noted involving T12 and T10. IMPRESSION: No acute cardiopulmonary findings. Electronically Signed   By: Marijo Sanes M.D.   On: 05/19/2017 17:31   Ct Angio Chest Pe W And/or Wo Contrast  Result Date: 05/19/2017 CLINICAL DATA:  Chest pain.  History of pulmonary embolism EXAM: CT ANGIOGRAPHY CHEST WITH CONTRAST TECHNIQUE: Multidetector CT imaging of the chest was performed using the standard protocol during bolus administration of intravenous contrast. Multiplanar CT image reconstructions and MIPs were obtained to evaluate the vascular anatomy. CONTRAST:  75 mL Isovue COMPARISON:  None. FINDINGS: Cardiovascular: No filling defects within the pulmonary arteries to suggest acute pulmonary embolism. No acute findings of the aorta or great vessels. No pericardial fluid. No acute  findings of the aorta great vessels.  No pericardial fluid. Mediastinum/Nodes: No axillary supraclavicular adenopathy. No mediastinal or hilar adenopathy. No pericardial fluid esophagus normal Lungs/Pleura: No pulmonary infarction. Mild basilar atelectasis. No pneumonia. No pneumothorax. Upper Abdomen: Limited view of the liver, kidneys, pancreas are unremarkable. Normal adrenal glands. Musculoskeletal: No aggressive osseous lesion. Review of the MIP images confirms the above findings. IMPRESSION: 1. No evidence acute pulmonary embolism. 2. No acute pulmonary parenchymal findings. Electronically Signed   By: Suzy Bouchard M.D.   On: 05/19/2017 18:21    Cardiac Studies   LEFT HEART CATH AND CORONARY ANGIOGRAPHY  Conclusion     RPDA lesion, 100 %stenosed.  The left ventricular systolic function is normal.  LV end diastolic pressure is normal.  The left ventricular ejection fraction is 55-65% by visual estimate.   1. 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 Otherwise normal coronary arteries. 3. Normal LV function with very small focal area of distal inferior hypokinesis.  Plan: medical therapy.   Pending echo  Patient Profile     51 y.o. female with h/o CVA, HTN, depression, CKD stage III and ITP hospitalized for chest pain at [facility]. Found to have NSTEMI and transferred to [facility] for cath.   Assessment & Plan    1. NSTEMI - Peak of troponin 2.28. Cath showed 100% distal PDA otherwise normal coronaries. No intervention. Plan for medical management. LVEF of 55-65% by cath with very small focal area of distal inferior hypokinesis. Echo pending.  - Still ongoing chest pain. CTA negative for PE. - Continue statin, BB and ASA (change to 81 once resolved CP). Consider changing IV heparin to Imdur later today.   2. CKD, stage III - Scr was 1.58-->1.78 after cath yesterday. Pending lab today.  3. HTN - Stable on BB  Signed, Summer Cook,Bhavinkumar, Summer Cook  05/21/2017, 10:04 AM     Patient seen, examined. Available data reviewed. Agree with findings, assessment, and plan as outlined by Summer Lis, Summer Cook-C. On exam the patient is alert, oriented, in no distress. JVP is normal. Lungs are clear. Heart is regular rate and rhythm without murmur or gallop. Abdomen is soft and nontender. There is no peripheral edema. Her right radial access site is clear. Her echo  is now interpreted with normal LV function noted. Result as below:  Study Conclusions  - Left ventricle: The cavity size was normal. Systolic function was   normal. Wall motion was normal; there were no regional wall   motion abnormalities. Doppler parameters are consistent with   abnormal left ventricular relaxation (grade 1 diastolic   dysfunction). Doppler parameters are consistent with elevated   ventricular end-diastolic filling pressure. - Aortic valve: There was no regurgitation. - Mitral valve: There was mild regurgitation. - Right ventricle: The cavity size was normal. Wall thickness was   normal. Systolic function was normal. - Right atrium: The atrium was normal in size. - Tricuspid valve: There was no regurgitation. - Pulmonic valve: There was no regurgitation. - Pulmonary arteries: Systolic pressure could not be accurately   estimated. - Inferior vena cava: The vessel was normal in size. - Pericardium, extracardiac: There was no pericardial effusion.  At the time of my evaluation, her primary complaint is headache. She had total occlusion of the right PDA with medical management recommended. There was no other significant CAD. Will discontinue IV nitroglycerin. We'll start Plavix for medical therapy of non-STEMI. Reduce aspirin dose to 81 mg. Continue high intensity statin drug and beta blocker. If she is stable tomorrow I would anticipate hospital discharge in the morning.  Summer Cook, M.D. 05/21/2017 3:30 PM

## 2017-05-21 NOTE — Progress Notes (Signed)
  Echocardiogram 2D Echocardiogram has been performed.  Tresa Res 05/21/2017, 11:14 AM

## 2017-05-22 ENCOUNTER — Other Ambulatory Visit: Payer: Self-pay | Admitting: Physician Assistant

## 2017-05-22 ENCOUNTER — Encounter (HOSPITAL_COMMUNITY): Payer: Self-pay | Admitting: Physician Assistant

## 2017-05-22 DIAGNOSIS — I200 Unstable angina: Secondary | ICD-10-CM | POA: Diagnosis not present

## 2017-05-22 DIAGNOSIS — R079 Chest pain, unspecified: Secondary | ICD-10-CM

## 2017-05-22 DIAGNOSIS — N189 Chronic kidney disease, unspecified: Secondary | ICD-10-CM

## 2017-05-22 DIAGNOSIS — E782 Mixed hyperlipidemia: Secondary | ICD-10-CM | POA: Diagnosis not present

## 2017-05-22 DIAGNOSIS — I214 Non-ST elevation (NSTEMI) myocardial infarction: Secondary | ICD-10-CM | POA: Diagnosis not present

## 2017-05-22 DIAGNOSIS — I10 Essential (primary) hypertension: Secondary | ICD-10-CM | POA: Diagnosis not present

## 2017-05-22 LAB — COMPREHENSIVE METABOLIC PANEL
ALBUMIN: 3.1 g/dL — AB (ref 3.5–5.0)
ALK PHOS: 86 U/L (ref 38–126)
ALT: 15 U/L (ref 14–54)
ANION GAP: 6 (ref 5–15)
AST: 26 U/L (ref 15–41)
BUN: 33 mg/dL — ABNORMAL HIGH (ref 6–20)
CALCIUM: 8.8 mg/dL — AB (ref 8.9–10.3)
CO2: 27 mmol/L (ref 22–32)
Chloride: 108 mmol/L (ref 101–111)
Creatinine, Ser: 2.06 mg/dL — ABNORMAL HIGH (ref 0.44–1.00)
GFR calc non Af Amer: 27 mL/min — ABNORMAL LOW (ref >60)
GFR, EST AFRICAN AMERICAN: 31 mL/min — AB (ref >60)
GLUCOSE: 124 mg/dL — AB (ref 65–99)
POTASSIUM: 4.9 mmol/L (ref 3.5–5.1)
SODIUM: 141 mmol/L (ref 135–145)
Total Bilirubin: 0.4 mg/dL (ref 0.3–1.2)
Total Protein: 5.8 g/dL — ABNORMAL LOW (ref 6.5–8.1)

## 2017-05-22 LAB — CBC
HEMATOCRIT: 39.1 % (ref 36.0–46.0)
HEMOGLOBIN: 12.4 g/dL (ref 12.0–15.0)
MCH: 28.1 pg (ref 26.0–34.0)
MCHC: 31.7 g/dL (ref 30.0–36.0)
MCV: 88.7 fL (ref 78.0–100.0)
Platelets: 212 10*3/uL (ref 150–400)
RBC: 4.41 MIL/uL (ref 3.87–5.11)
RDW: 14.2 % (ref 11.5–15.5)
WBC: 11 10*3/uL — ABNORMAL HIGH (ref 4.0–10.5)

## 2017-05-22 MED ORDER — ATORVASTATIN CALCIUM 80 MG PO TABS: 80.0000 mg | ORAL_TABLET | Freq: Every day | ORAL | 1 refills | Status: DC

## 2017-05-22 MED ORDER — ASPIRIN 81 MG PO TBEC: 81.0000 mg | DELAYED_RELEASE_TABLET | Freq: Every day | ORAL | Status: DC

## 2017-05-22 MED ORDER — METOPROLOL TARTRATE 25 MG PO TABS: 12.5000 mg | ORAL_TABLET | Freq: Two times a day (BID) | ORAL | 6 refills | Status: DC

## 2017-05-22 MED ORDER — CLOPIDOGREL BISULFATE 75 MG PO TABS: 75.0000 mg | ORAL_TABLET | Freq: Every day | ORAL | 0 refills | Status: DC

## 2017-05-22 NOTE — Discharge Summary (Signed)
Discharge Summary    Patient ID: Summer Cook,  MRN: [id number], DOB/AGE: March 20, 1966 51 y.o.  Admit date: 05/19/2017 Discharge date: 05/22/2017  Primary Care Provider: [employer] Primary Cardiologist: Dr. 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 Brintellix [Vortioxetine] Itching  . Yellow Jacket Venom Swelling  . Adhesive [Tape] Rash    Please use paper tape  . Zoloft [Sertraline Hcl] Diarrhea     History of Present Illness     51 y.o. female with h/o CVA, HTN, depression, CKD stage III and ITP hospitalized for chest pain at [facility]. Found to have NSTEMI and transferred to [facility] for cath.   She was playing video games on the morning prior to presentation when she began to experience retrosternal chest pain lasting 15 minutes. It subsided on its own and then 2 hours later she developed it again, but this time it did not go away. She drove herself to the [facility] ED where her troponin was noted to be elevated: 0.11-->0.55-->1.69. ECG showed NSR with nonspecific IVCD with old inferior MI and old anterolateral infarct (other than inferior Q waves, no significant changes from 08/2016. LDL 149. She was started on IV heparin and IV nitro and transferred to [facility] for further work up and management.    Hospital Course     Consultants: none  1. NSTEMI - Peak of troponin 2.28. Cath showed 100% distal PDA otherwise normal coronaries. No intervention. Plan for medical management. LVEF of 55-65% by cath with very small focal area of distal  inferior hypokinesis. Echo showed normal LVEF. No regional wall motion abnormalities.CTA negative for PE. - Continue statin, BB, Plavix and ASA  - discharge today, we will arrange for a follow up within 7-10 days.  - cath insertion site no bruit or bleeding, good peripheral pulses  2. CKD, stage III - Scr was 1.58-->1.78-> 2.06, no ACEI or ARB, advised to hydrate well  - we will recheck Crea at the follow up visit  3. HTN - Stable on BB    The patient has had an uncomplicated hospital course and is recovering well. The radial catheter site is stable. She has been seen by Dr.Nelson today and deemed ready for discharge home. A staff message for all follow-up appointments has been sent. Discharge medications are listed below.  _____________  Discharge Vitals Blood pressure 110/76, pulse 73, temperature 98.1 F (36.7 C), temperature source Oral, resp. rate 18, height 5\' 5"  (1.651 m), weight 183 lb 1.6 oz (83.1 kg), last menstrual period 08/13/2011, SpO2 92 %.  Filed Weights   05/19/17 1543 05/19/17 2040  Weight: 190 lb (86.2 kg) 183 lb 1.6 oz (83.1 kg)    Labs & Radiologic Studies     CBC  Recent Labs  05/20/17 1902 05/22/17 0308  WBC 8.4 11.0*  HGB 12.6 12.4  HCT 39.9 39.1  MCV 88.5 88.7  PLT 200 630   Basic Metabolic Panel  Recent Labs  05/20/17 0433 05/20/17 1902 05/22/17 0308  NA 137  --  141  K 4.3  --  4.9  CL 104  --  108  CO2 24  --  27  GLUCOSE 90  --  124*  BUN 29*  --  33*  CREATININE 1.58* 1.78* 2.06*  CALCIUM 8.8*  --  8.8*   Liver Function Tests  Recent Labs  05/20/17 0433 05/22/17 0308  AST 31 26  ALT 16 15  ALKPHOS 83 86  BILITOT 0.6 0.4  PROT 6.7 5.8*  ALBUMIN 3.5 3.1*   No results for input(s): LIPASE, AMYLASE in the last 72 hours. Cardiac Enzymes  Recent Labs  05/19/17 2216 05/20/17 0433 05/20/17 1040  TROPONINI 0.55* 1.69* 2.28*   BNP Invalid input(s): POCBNP D-Dimer  Recent Labs  05/19/17 1616  DDIMER 0.55*    Hemoglobin A1C  Recent Labs  05/19/17 1910  HGBA1C 5.6   Fasting Lipid Panel  Recent Labs  05/19/17 1912  CHOL 229*  HDL 57  LDLCALC 149*  TRIG 114  CHOLHDL 4.0   Thyroid Function Tests No results for input(s): TSH, T4TOTAL, T3FREE, THYROIDAB in the last 72 hours.  Invalid input(s): FREET3  Dg Chest 2 View  Result Date: 05/19/2017 CLINICAL DATA:  Chest pain for 2 hours. EXAM: CHEST  2 VIEW COMPARISON:  01/14/2017 FINDINGS: The cardiac silhouette, mediastinal and hilar contours are within normal limits and stable. The lungs are clear. No pleural effusion. The bony thorax is intact. Remote compression deformities are noted involving T12 and T10. IMPRESSION: No acute cardiopulmonary findings. Electronically Signed   By: Marijo Sanes M.D.   On: 05/19/2017 17:31   Ct Angio Chest Pe W And/or Wo Contrast  Result Date: 05/19/2017 CLINICAL DATA:  Chest pain.  History of pulmonary embolism EXAM: CT ANGIOGRAPHY CHEST WITH CONTRAST TECHNIQUE: Multidetector CT imaging of the chest was performed using the standard protocol during bolus administration of intravenous contrast. Multiplanar CT image reconstructions and MIPs were obtained to evaluate the vascular anatomy. CONTRAST:  75 mL Isovue COMPARISON:  None. FINDINGS: Cardiovascular: No filling defects within the pulmonary arteries to suggest acute pulmonary embolism. No acute findings of the aorta or great vessels. No pericardial fluid. No acute findings of the aorta great vessels.  No pericardial fluid. Mediastinum/Nodes: No axillary supraclavicular adenopathy. No mediastinal or hilar adenopathy. No pericardial fluid esophagus normal Lungs/Pleura: No pulmonary infarction. Mild basilar atelectasis. No pneumonia. No pneumothorax. Upper Abdomen: Limited view of the liver, kidneys, pancreas are unremarkable. Normal adrenal glands. Musculoskeletal: No aggressive osseous lesion. Review of the MIP images confirms the above findings. IMPRESSION: 1. No  evidence acute pulmonary embolism. 2. No acute pulmonary parenchymal findings. Electronically Signed   By: Suzy Bouchard M.D.   On: 05/19/2017 18:21     Diagnostic Studies/Procedures    05/20/17 LEFT HEART CATH AND CORONARY ANGIOGRAPHY  Conclusion    RPDA lesion, 100 %stenosed.  The left ventricular systolic function is normal.  LV end diastolic pressure is normal.  The left ventricular ejection fraction is 55-65% by visual estimate.   1. 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 Otherwise normal coronary arteries. 3. Normal LV function with very small focal area of distal inferior hypokinesis.  Plan: medical therapy.    _____________  2D ECHO: 05/21/2017 Study Conclusions - Left ventricle: The cavity size was normal. Systolic function was   normal. Wall motion was normal; there were no regional wall   motion abnormalities. Doppler parameters are consistent with   abnormal left ventricular relaxation (grade 1 diastolic   dysfunction).  Doppler parameters are consistent with elevated   ventricular end-diastolic filling pressure. - Aortic valve: There was no regurgitation. - Mitral valve: There was mild regurgitation. - Right ventricle: The cavity size was normal. Wall thickness was   normal. Systolic function was normal. - Right atrium: The atrium was normal in size. - Tricuspid valve: There was no regurgitation. - Pulmonic valve: There was no regurgitation. - Pulmonary arteries: Systolic pressure could not be accurately   estimated. - Inferior vena cava: The vessel was normal in size. - Pericardium, extracardiac: There was no pericardial effusion.  Disposition   Pt is being discharged home today in good condition.  Follow-up Plans & Appointments    Follow-up Information    Satira Sark, MD Follow up.   Specialty:  Cardiology Why:  The office will call to arrange a 1 week appointment in our office. Please call them if you do not hear  from them by Tuesday 8/28. You will also need blood work by the ned of next week to make sure your kidneys are okay after the heart cath.  Contact information: [place] [address] [phone]            Discharge Medications     Medication List    TAKE these medications   aspirin 81 MG EC tablet Take 1 tablet (81 mg total) by mouth daily.   atorvastatin 80 MG tablet Commonly known as:  LIPITOR Take 1 tablet (80 mg total) by mouth daily at 6 PM.   clopidogrel 75 MG tablet Commonly known as:  PLAVIX Take 1 tablet (75 mg total) by mouth daily.   escitalopram 20 MG tablet Commonly known as:  LEXAPRO Take 20 mg by mouth daily.   ipratropium-albuterol 0.5-2.5 (3) MG/3ML Soln Commonly known as:  DUONEB Take 3 mLs by nebulization 3 (three) times daily as needed (shorntess of breath/wheezing). What changed:  reasons to take this   metoprolol tartrate 25 MG tablet Commonly known as:  LOPRESSOR Take 0.5 tablets (12.5 mg total) by mouth 2 (two) times daily.   PROAIR RESPICLICK 939 (90 Base) MCG/ACT Aepb Generic drug:  Albuterol Sulfate Inhale 2 puffs into the lungs every 4 (four) hours as needed (Shortness of breath).         Outstanding Labs/Studies   BMET  Duration of Discharge Encounter   Greater than 30 minutes including physician time.  Signed, Angelena Form PA-C 05/22/2017, 11:04 AM

## 2017-05-22 NOTE — Progress Notes (Signed)
Discharged to home with family office visits in place teaching done  

## 2017-05-22 NOTE — Progress Notes (Signed)
Progress Note  Patient Name: Summer Cook Date of Encounter: 05/22/2017  Primary Cardiologist: Dr. Domenic Polite   Subjective   NO chest pain this morning with walking. No headache. She feels anxious.  Inpatient Medications    Scheduled Meds: . aspirin EC  81 mg Oral Daily  . atorvastatin  80 mg Oral q1800  . clopidogrel  75 mg Oral Daily  . enoxaparin (LOVENOX) injection  40 mg Subcutaneous Q24H  . escitalopram  20 mg Oral Daily  . metoprolol tartrate  12.5 mg Oral BID  . sodium chloride flush  3 mL Intravenous Q12H   Continuous Infusions: . sodium chloride     PRN Meds: sodium chloride, acetaminophen, albuterol, HYDROmorphone (DILAUDID) injection, morphine injection, ondansetron (ZOFRAN) IV, sodium chloride flush   Vital Signs    Vitals:   05/22/17 0000 05/22/17 0018 05/22/17 0100 05/22/17 0400  BP:  120/85  110/76  Pulse: 75 77  73  Resp: (!) 21 17 18 18   Temp:  98.3 F (36.8 C)  98.1 F (36.7 C)  TempSrc:  Oral  Oral  SpO2: 96% 96% 94% 92%  Weight:      Height:        Intake/Output Summary (Last 24 hours) at 05/22/17 1002 Last data filed at 05/22/17 0335  Gross per 24 hour  Intake             41.7 ml  Output                0 ml  Net             41.7 ml   Filed Weights   05/19/17 1543 05/19/17 2040  Weight: 190 lb (86.2 kg) 183 lb 1.6 oz (83.1 kg)    Telemetry    SR at rate of 60s- Personally Reviewed  ECG    None today   Physical Exam   GEN: No acute distress.   Neck: No JVD Cardiac: RRR, no murmurs, rubs, or gallops. R radial cath site without hematoma.  Respiratory: Clear to auscultation bilaterally. GI: Soft, nontender, non-distended  MS: No edema; No deformity. Neuro:  Nonfocal  Psych: Normal affect   Labs    Chemistry  Recent Labs Lab 05/19/17 1557 05/20/17 0433 05/20/17 1902 05/22/17 0308  NA 139 137  --  141  K 3.9 4.3  --  4.9  CL 107 104  --  108  CO2 25 24  --  27  GLUCOSE 108* 90  --  124*  BUN 28* 29*  --  33*    CREATININE 1.78* 1.58* 1.78* 2.06*  CALCIUM 8.8* 8.8*  --  8.8*  PROT  --  6.7  --  5.8*  ALBUMIN  --  3.5  --  3.1*  AST  --  31  --  26  ALT  --  16  --  15  ALKPHOS  --  83  --  86  BILITOT  --  0.6  --  0.4  GFRNONAA 32* 37* 32* 27*  GFRAA 37* 43* 37* 31*  ANIONGAP 7 9  --  6     Hematology  Recent Labs Lab 05/20/17 0433 05/20/17 1902 05/22/17 0308  WBC 9.0 8.4 11.0*  RBC 4.59 4.51 4.41  HGB 13.3 12.6 12.4  HCT 41.0 39.9 39.1  MCV 89.3 88.5 88.7  MCH 29.0 27.9 28.1  MCHC 32.4 31.6 31.7  RDW 14.2 14.1 14.2  PLT 209 200 212    Cardiac Enzymes  Recent Labs Lab 05/19/17 1912 05/19/17 2216 05/20/17 0433 05/20/17 1040  TROPONINI 0.11* 0.55* 1.69* 2.28*     Recent Labs Lab 05/19/17 1609  TROPIPOC 0.00     BNPNo results for input(s): BNP, PROBNP in the last 168 hours.   DDimer   Recent Labs Lab 05/19/17 1616  DDIMER 0.55*     Radiology    No results found.  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  The left ventricular systolic function is normal.  LV end diastolic pressure is normal.  The left ventricular ejection fraction is 55-65% by visual estimate.   1. 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 Otherwise normal coronary arteries. 3. Normal LV function with very small focal area of distal inferior hypokinesis.  Plan: medical therapy.   TTE: 05/21/17 - Left ventricle: The cavity size was normal. Systolic function was   normal. Wall motion was normal; there were no regional wall   motion abnormalities. Doppler parameters are consistent with   abnormal left ventricular relaxation (grade 1 diastolic   dysfunction). Doppler parameters are consistent with elevated   ventricular end-diastolic filling pressure. - Aortic valve: There was no regurgitation. - Mitral valve: There was mild regurgitation. - Right ventricle: The cavity size was normal. Wall  thickness was   normal. Systolic function was normal. - Right atrium: The atrium was normal in size. - Tricuspid valve: There was no regurgitation. - Pulmonic valve: There was no regurgitation. - Pulmonary arteries: Systolic pressure could not be accurately   estimated. - Inferior vena cava: The vessel was normal in size. - Pericardium, extracardiac: There was no pericardial effusion.    Patient Profile     51 y.o. female with h/o CVA, HTN, depression, CKD stage III and ITP hospitalized for chest pain at [facility]. Found to have NSTEMI and transferred to [facility] for cath.   Assessment & Plan    1. NSTEMI - Peak of troponin 2.28. Cath showed 100% distal PDA otherwise normal coronaries. No intervention. Plan for medical management. LVEF of 55-65% by cath with very small focal area of distal inferior hypokinesis. Echo showed normal LVEF. No regional wall motion abnormalities. CTA negative for PE. - Continue statin, BB, Plavix and ASA  - discharge today, we will arrange for a follow up within 7-10 days.  - cath insertion site no bruit or bleeding, good peripheral pulses  2. CKD, stage III - Scr was 1.58-->1.78-> 2.06, no ACEI or ARB, advised to hydrate well  - we will recheck Crea at the follow up visit  3. HTN - Stable on BB  Signed, Ena Dawley, MD  05/22/2017, 10:02 AM

## 2017-05-22 NOTE — Discharge Instructions (Signed)

## 2017-05-23 ENCOUNTER — Encounter (HOSPITAL_COMMUNITY): Payer: Self-pay | Admitting: Emergency Medicine

## 2017-05-23 ENCOUNTER — Other Ambulatory Visit: Payer: Self-pay

## 2017-05-23 ENCOUNTER — Observation Stay (HOSPITAL_COMMUNITY)
Admission: EM | Admit: 2017-05-23 | Discharge: 2017-05-25 | Disposition: A | Discharge status: Home / Self Care | Payer: Medicare HMO | Attending: Cardiology | Admitting: Cardiology

## 2017-05-23 ENCOUNTER — Emergency Department (HOSPITAL_COMMUNITY): Payer: Medicare HMO

## 2017-05-23 DIAGNOSIS — I214 Non-ST elevation (NSTEMI) myocardial infarction: Secondary | ICD-10-CM | POA: Diagnosis not present

## 2017-05-23 DIAGNOSIS — Z87891 Personal history of nicotine dependence: Secondary | ICD-10-CM | POA: Diagnosis not present

## 2017-05-23 DIAGNOSIS — I2510 Atherosclerotic heart disease of native coronary artery without angina pectoris: Principal | ICD-10-CM

## 2017-05-23 DIAGNOSIS — D693 Immune thrombocytopenic purpura: Secondary | ICD-10-CM | POA: Insufficient documentation

## 2017-05-23 DIAGNOSIS — Z86718 Personal history of other venous thrombosis and embolism: Secondary | ICD-10-CM | POA: Diagnosis not present

## 2017-05-23 DIAGNOSIS — N183 Chronic kidney disease, stage 3 (moderate): Secondary | ICD-10-CM | POA: Diagnosis not present

## 2017-05-23 DIAGNOSIS — I129 Hypertensive chronic kidney disease with stage 1 through stage 4 chronic kidney disease, or unspecified chronic kidney disease: Secondary | ICD-10-CM | POA: Diagnosis not present

## 2017-05-23 DIAGNOSIS — E1122 Type 2 diabetes mellitus with diabetic chronic kidney disease: Secondary | ICD-10-CM | POA: Insufficient documentation

## 2017-05-23 DIAGNOSIS — Z79899 Other long term (current) drug therapy: Secondary | ICD-10-CM | POA: Diagnosis not present

## 2017-05-23 DIAGNOSIS — E782 Mixed hyperlipidemia: Secondary | ICD-10-CM | POA: Insufficient documentation

## 2017-05-23 DIAGNOSIS — R079 Chest pain, unspecified: Secondary | ICD-10-CM | POA: Diagnosis not present

## 2017-05-23 DIAGNOSIS — N390 Urinary tract infection, site not specified: Secondary | ICD-10-CM

## 2017-05-23 DIAGNOSIS — Z7982 Long term (current) use of aspirin: Secondary | ICD-10-CM | POA: Insufficient documentation

## 2017-05-23 DIAGNOSIS — E7800 Pure hypercholesterolemia, unspecified: Secondary | ICD-10-CM

## 2017-05-23 DIAGNOSIS — I252 Old myocardial infarction: Secondary | ICD-10-CM | POA: Insufficient documentation

## 2017-05-23 DIAGNOSIS — R0602 Shortness of breath: Secondary | ICD-10-CM | POA: Diagnosis not present

## 2017-05-23 HISTORY — DX: Chest pain, unspecified: R07.9

## 2017-05-23 LAB — HEPATIC FUNCTION PANEL
ALBUMIN: 3.4 g/dL — AB (ref 3.5–5.0)
ALK PHOS: 92 U/L (ref 38–126)
ALT: 19 U/L (ref 14–54)
ALT: 18 U/L (ref 14–54)
AST: 27 U/L (ref 15–41)
AST: 29 U/L (ref 15–41)
Albumin: 3.6 g/dL (ref 3.5–5.0)
Alkaline Phosphatase: 89 U/L (ref 38–126)
BILIRUBIN INDIRECT: 0.5 mg/dL (ref 0.3–0.9)
BILIRUBIN TOTAL: 0.6 mg/dL (ref 0.3–1.2)
Bilirubin, Direct: 0.1 mg/dL (ref 0.1–0.5)
Bilirubin, Direct: <0.1 mg/dL — ABNORMAL LOW (ref 0.1–0.5)
TOTAL PROTEIN: 6.8 g/dL (ref 6.5–8.1)
TOTAL PROTEIN: 6.9 g/dL (ref 6.5–8.1)
Total Bilirubin: 0.6 mg/dL (ref 0.3–1.2)

## 2017-05-23 LAB — BASIC METABOLIC PANEL
ANION GAP: 7 (ref 5–15)
BUN: 29 mg/dL — AB (ref 6–20)
CHLORIDE: 105 mmol/L (ref 101–111)
CO2: 24 mmol/L (ref 22–32)
Calcium: 9.2 mg/dL (ref 8.9–10.3)
Creatinine, Ser: 1.7 mg/dL — ABNORMAL HIGH (ref 0.44–1.00)
GFR calc Af Amer: 39 mL/min — ABNORMAL LOW (ref >60)
GFR calc non Af Amer: 34 mL/min — ABNORMAL LOW (ref >60)
Glucose, Bld: 79 mg/dL (ref 65–99)
POTASSIUM: 5.5 mmol/L — AB (ref 3.5–5.1)
SODIUM: 136 mmol/L (ref 135–145)

## 2017-05-23 LAB — CK TOTAL AND CKMB (NOT AT ARMC)
CK, MB: 1.2 ng/mL (ref 0.5–5.0)
RELATIVE INDEX: INVALID (ref 0.0–2.5)
Total CK: 54 U/L (ref 38–234)

## 2017-05-23 LAB — I-STAT TROPONIN, ED: Troponin i, poc: 0.81 ng/mL (ref 0.00–0.08)

## 2017-05-23 LAB — CBC
HCT: 43.1 % (ref 36.0–46.0)
Hemoglobin: 13.8 g/dL (ref 12.0–15.0)
MCH: 28.6 pg (ref 26.0–34.0)
MCHC: 32 g/dL (ref 30.0–36.0)
MCV: 89.2 fL (ref 78.0–100.0)
Platelets: 169 10*3/uL (ref 150–400)
RBC: 4.83 MIL/uL (ref 3.87–5.11)
RDW: 14.4 % (ref 11.5–15.5)
WBC: 6.3 10*3/uL (ref 4.0–10.5)

## 2017-05-23 LAB — BRAIN NATRIURETIC PEPTIDE: B Natriuretic Peptide: 43.5 pg/mL (ref 0.0–100.0)

## 2017-05-23 LAB — TROPONIN I: Troponin I: 0.74 ng/mL (ref <0.03)

## 2017-05-23 LAB — SEDIMENTATION RATE: Sed Rate: 17 mm/hr (ref 0–22)

## 2017-05-23 IMAGING — MR MR CERVICAL SPINE W/O CM
4 of 5 series · 18 of 48 positions shown · non-contrast
Comparison: CT of the cervical spine earlier today.

CLINICAL DATA: Left-sided facial and arm numbness. Altered mental
status.

EXAM:
MRI CERVICAL SPINE WITHOUT CONTRAST
TECHNIQUE: Multiplanar, multisequence MR imaging of the cervical spine was
performed. No intravenous contrast was administered.

[Series 1: T2 · sagittal · 3.0mm · 0.30mm/px · 6 of 13 slices shown (1 of 2)]
[im 1/13]
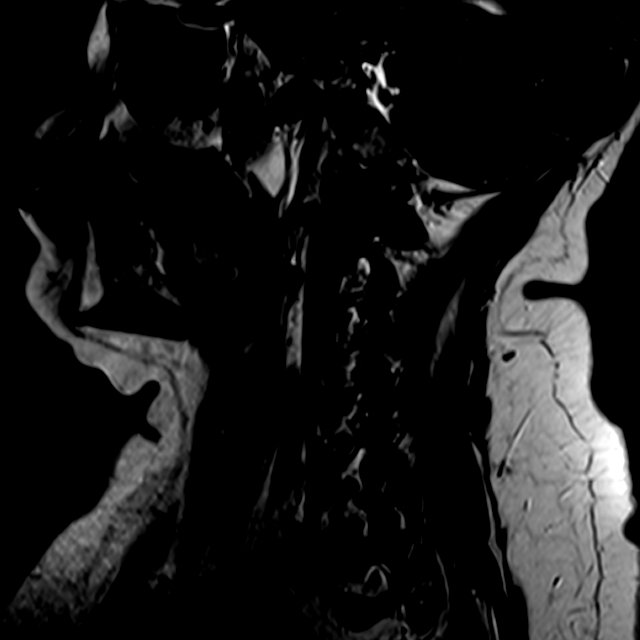
[im 3/13]
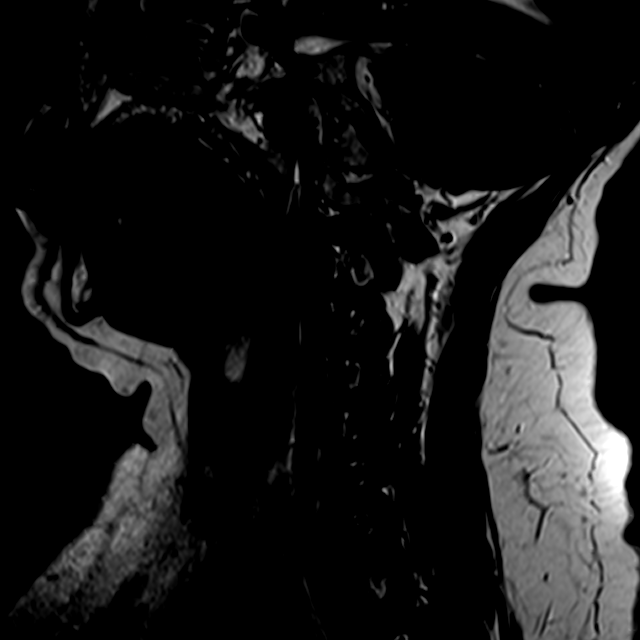
[im 5/13]
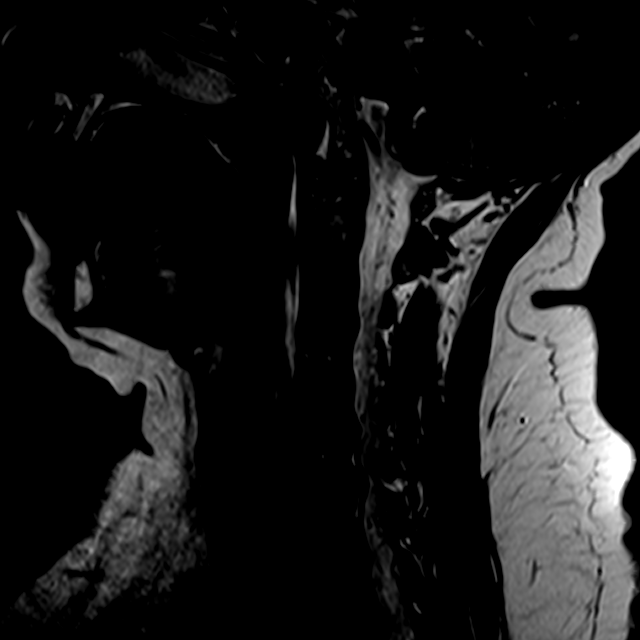
[im 8/13]
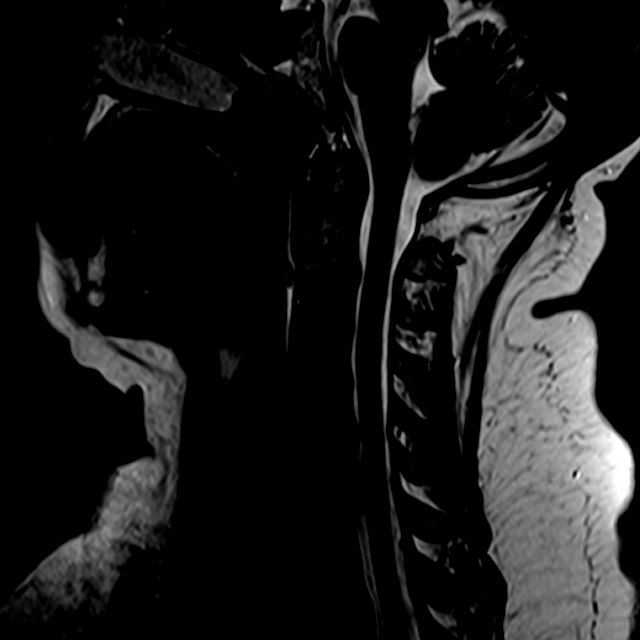
[im 10/13]
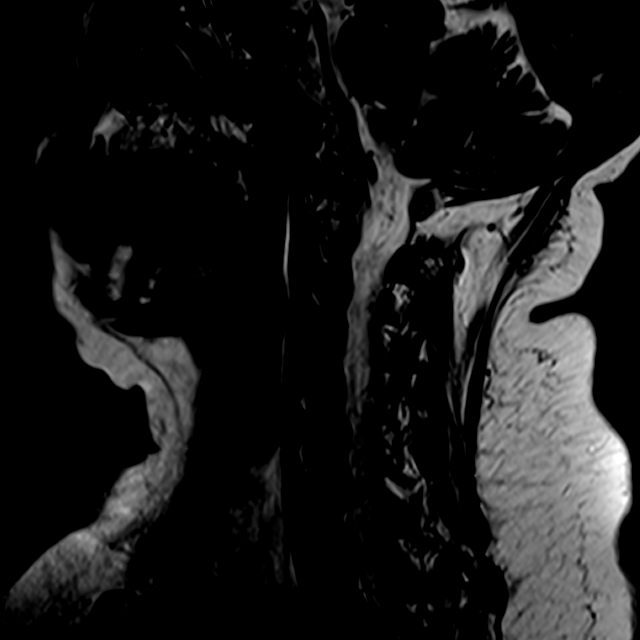
[im 13/13]
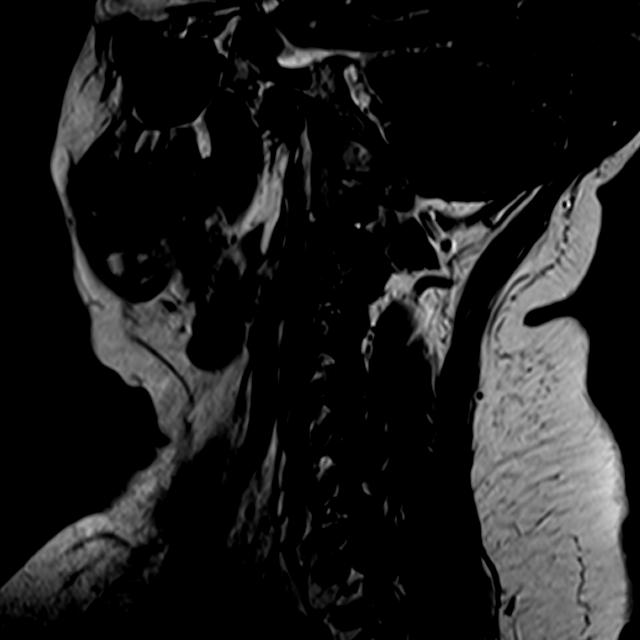

[Series 2: FLAIR · sagittal · 3.0mm · 0.61mm/px · 3 of 13 slices shown]
[im 3/13]
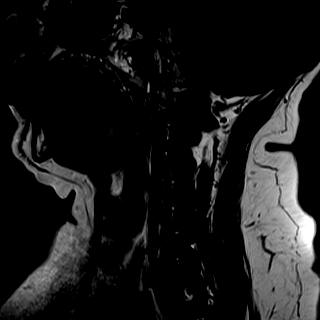
[im 8/13]
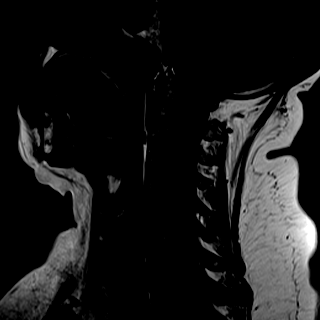
[im 13/13]
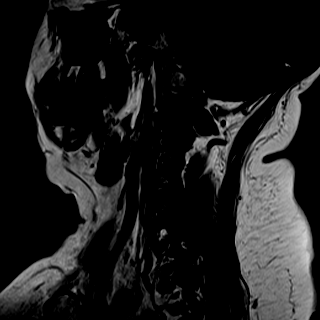

[Series 3: ir sagital · sagittal · 3.0mm · 0.38mm/px · 3 of 13 slices shown]
[im 3/13]
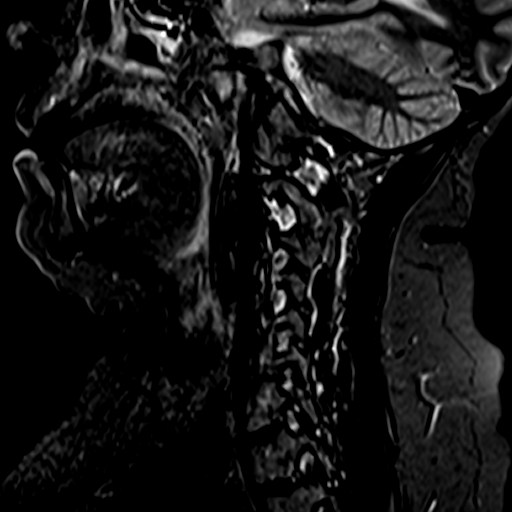
[im 8/13]
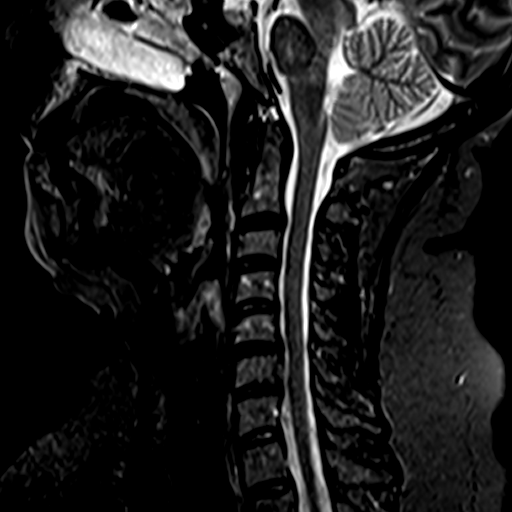
[im 13/13]
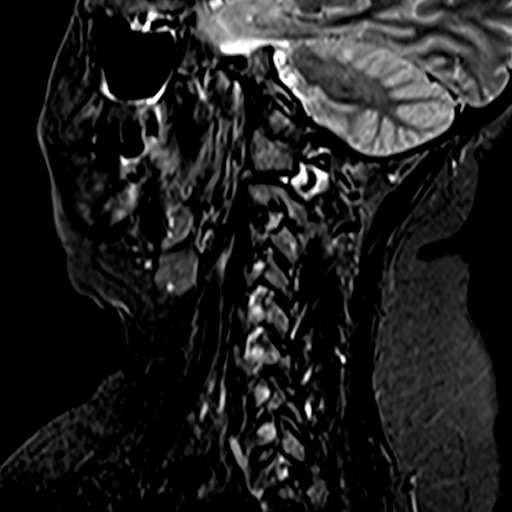

[Series 5: T2 · axial · 3.0mm · 0.24mm/px · z∈[-283,-207]mm · 6 of 30 slices shown (2 of 2)]
[im 1/30]
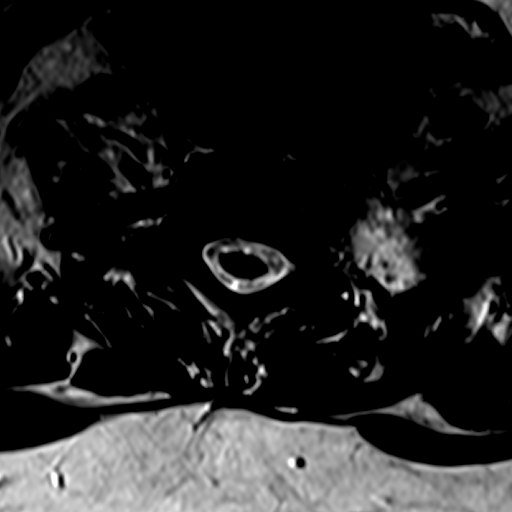
[im 5/30]
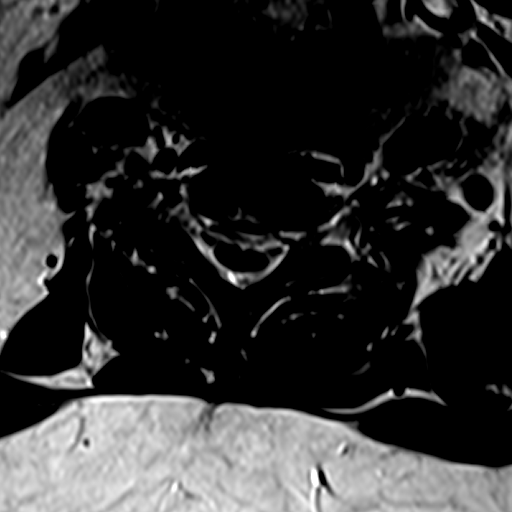
[im 9/30]
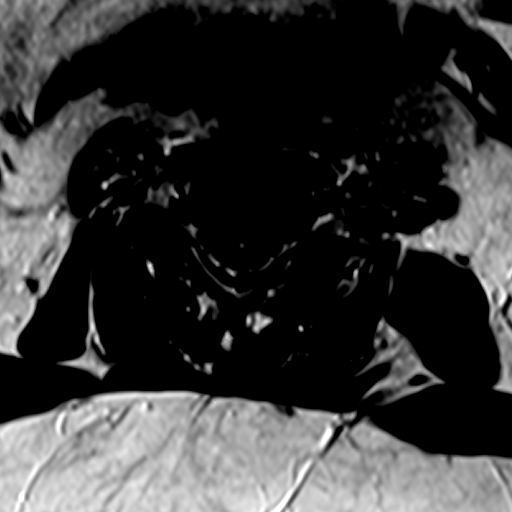
[im 13/30]
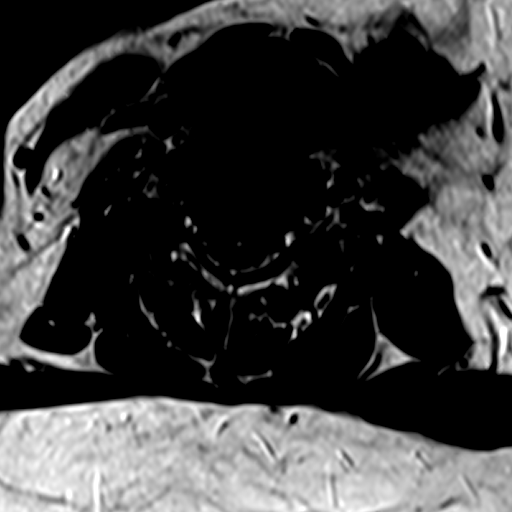
[im 15/30]
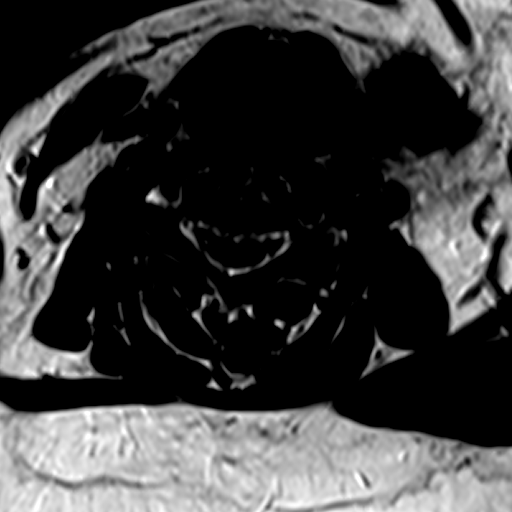
[im 25/30]
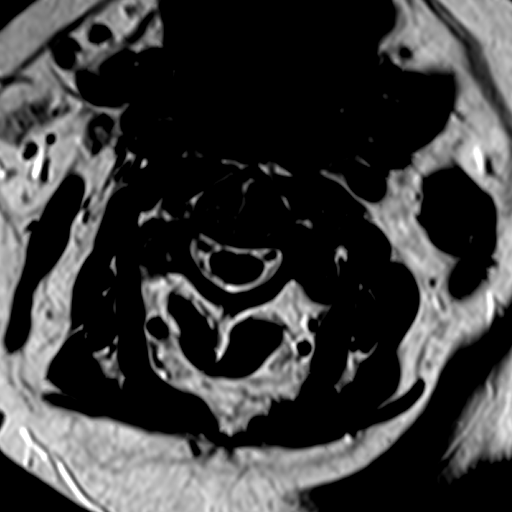

[18 of 48 positions shown; findings below may reference images not displayed]

FINDINGS: The patient was unable to remain motionless for the exam. Small or
subtle lesions could be overlooked.

Alignment: Anatomic.

Vertebrae: No worrisome osseous lesion.

Cord: Slight cord flattening at C5-6 and C6-7 without abnormal cord
signal.

Posterior Fossa: No tonsillar herniation.

Vertebral Arteries: Patent.

Paraspinal tissues: No masses.

Disc levels:

The individual disc spaces were examined as follows:

C2-3:  Shallow central protrusion.  No impingement.

C3-4:  Normal disc space.  Asymmetric facet arthropathy on the LEFT.

C4-5:  Mild bulge.  No impingement.

C5-6: Central and rightward protrusion. Disc osteophyte complex.
Mild right-sided cord flattening and foraminal narrowing. Disc space
narrowing.

C6-7: Central and leftward protrusion. Disc osteophyte complex. Mild
left-sided cord flattening and foraminal narrowing. Disc space
narrowing.

C7-T1:  Unremarkable.
IMPRESSION: Central and rightward protrusion at C5-6, as well as central and
leftward protrusion at C6-7 along with disc osteophyte complex and
disc space narrowing contribute to cord compression and foraminal
narrowing as described above. Correlate clinically for LEFT C7
radiculopathy with regard to LEFT arm numbness.

No acute features are observed.

## 2017-05-23 IMAGING — DX DG CHEST 2V
2 series · 2 of 2 positions shown · non-contrast
Comparison: 10/31/2015

CLINICAL DATA: Weakness

EXAM:
CHEST  2 VIEW

[chest pa]
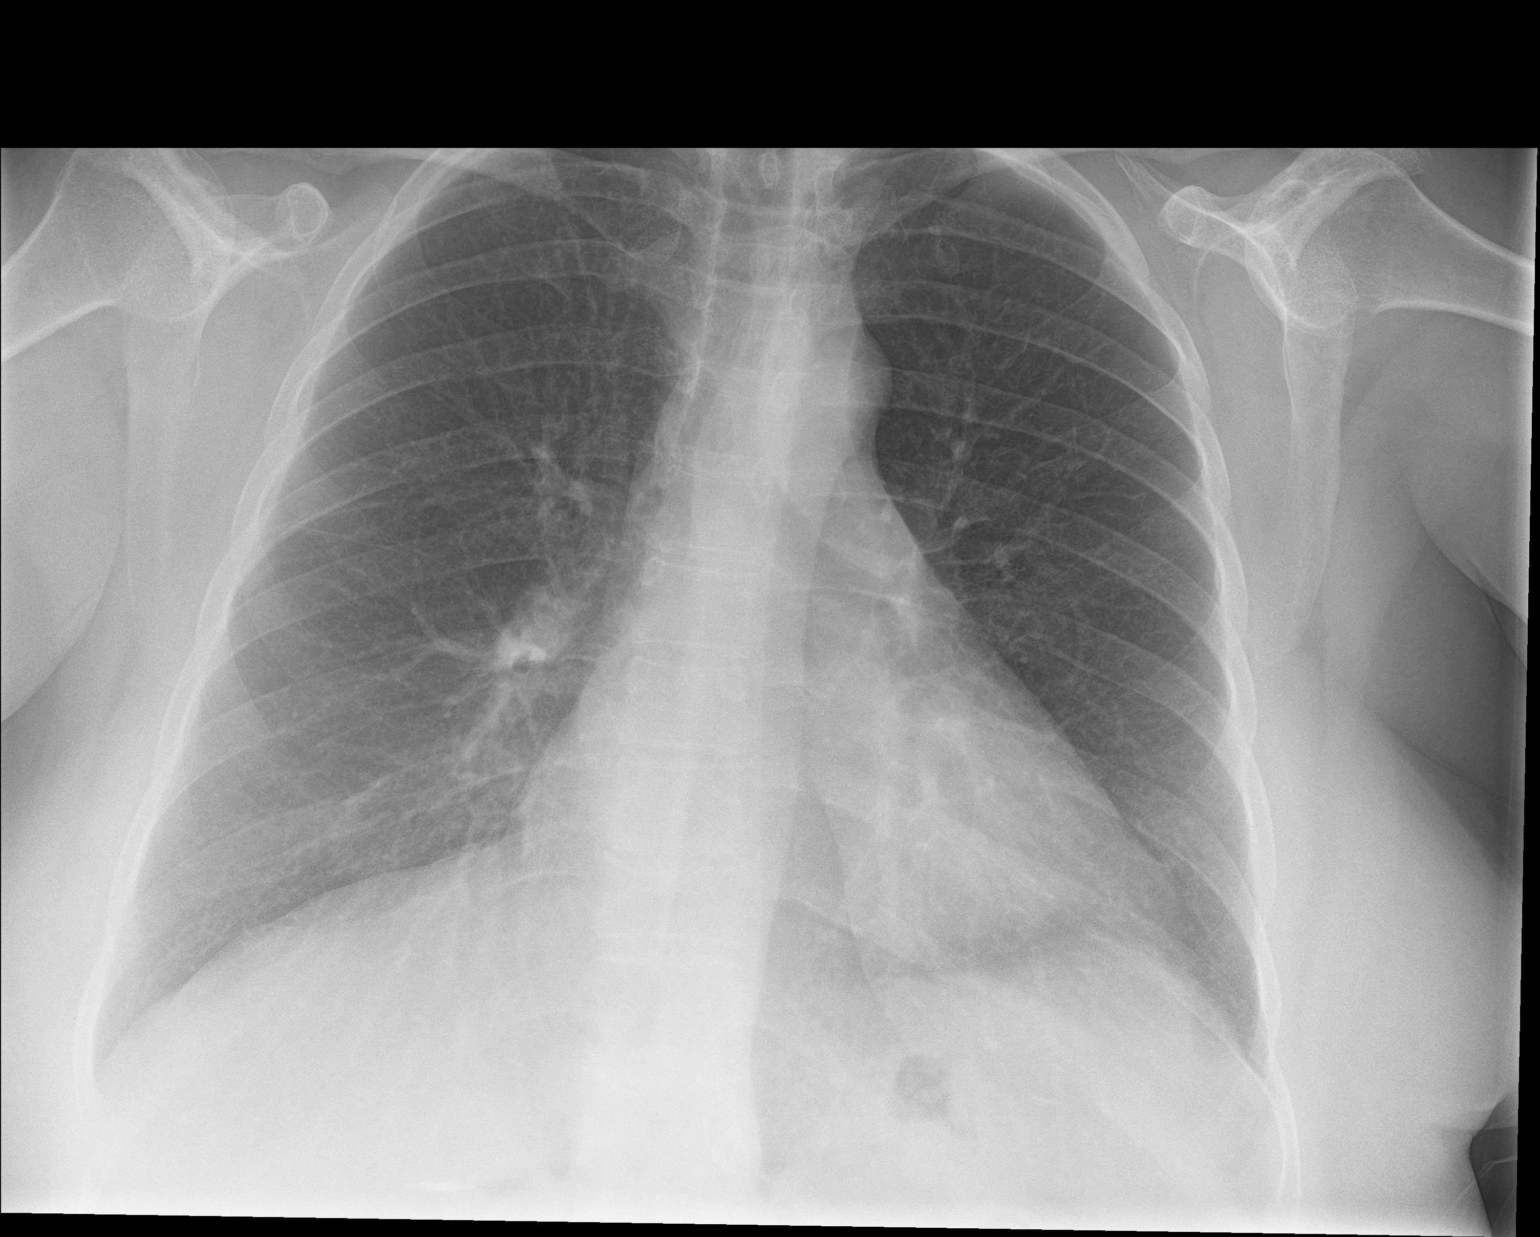

[chest lat]
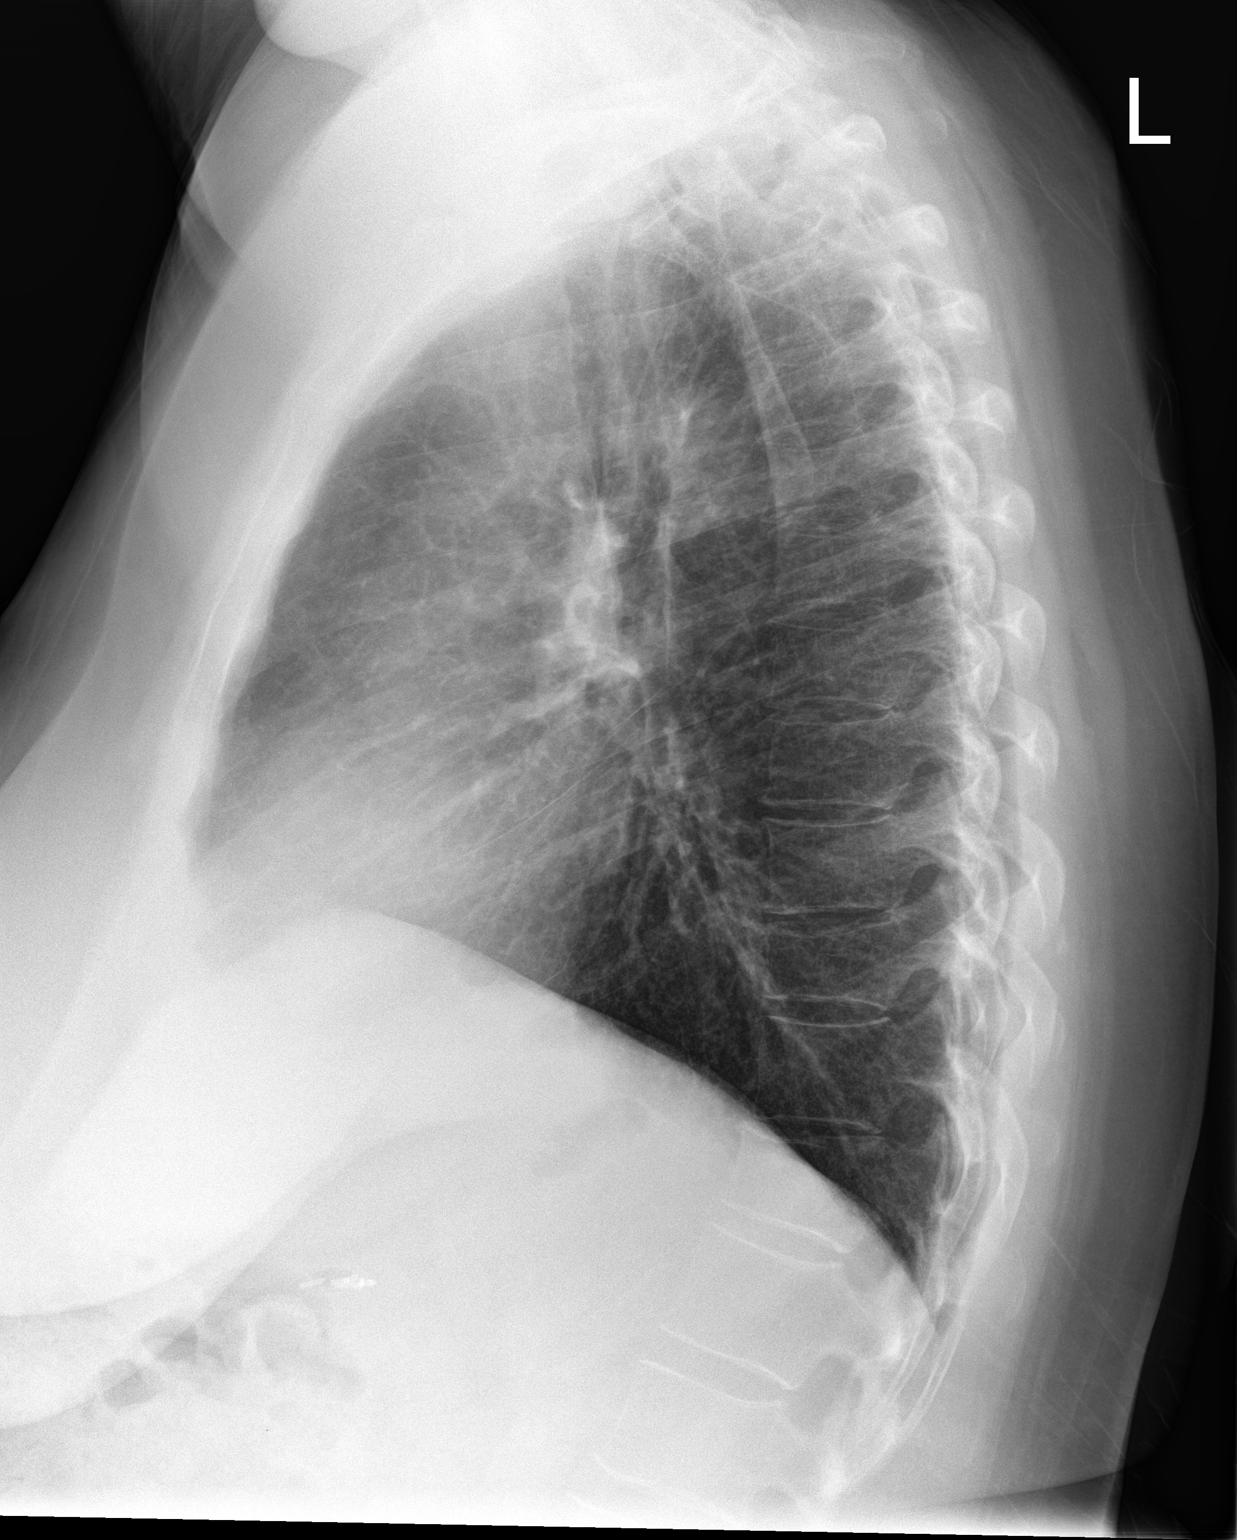

[2 of 2 positions shown; findings below may reference images not displayed]

FINDINGS: Mild hyperinflation. Heart size mildly enlarged. Negative for heart
failure. No infiltrate effusion or mass.

Mild compression fractures at 2 levels in the lower thoracic spine
unchanged from the prior study.
IMPRESSION: No active cardiopulmonary disease.

## 2017-05-23 IMAGING — MR MR HEAD W/O CM
8 of 10 series · 31 of 48 positions shown · non-contrast
Comparison: CT head earlier today.

CLINICAL DATA: Right-sided weakness which began [REDACTED]. Stroke
risk factors include hypertension, and diabetes. LEFT facial and arm
numbness.

EXAM:
MRI HEAD WITHOUT CONTRAST
MRA HEAD WITHOUT CONTRAST
TECHNIQUE: Multiplanar, multiecho pulse sequences of the brain and surrounding
structures were obtained without intravenous contrast. Angiographic
images of the head were obtained using MRA technique without
contrast.

[Series 4: [id number] · sagittal · 5.0mm · 0.42mm/px · 1 of 20 slices shown]
[im 1/20]
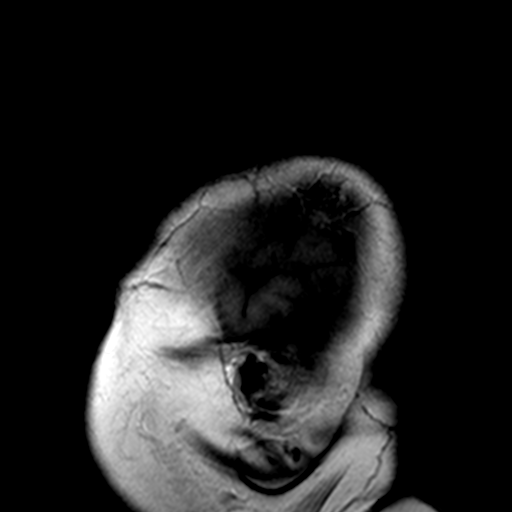

[Series 7: T2 · axial · 5.0mm · 0.75mm/px · z∈[-164,-22]mm · 3 of 23 slices shown (1 of 2)]
[im 1/23]
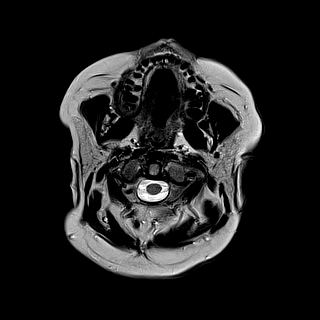
[im 12/23]
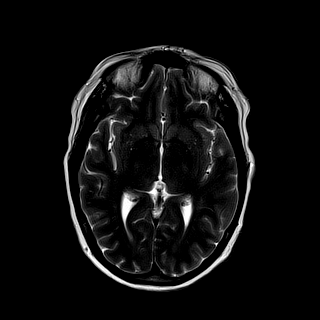
[im 23/23]
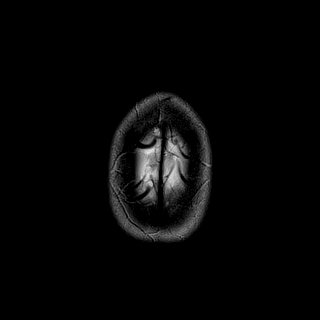

[Series 8: FLAIR · axial · 5.0mm · 0.94mm/px · z∈[-164,-22]mm · 3 of 23 slices shown]
[im 1/23]
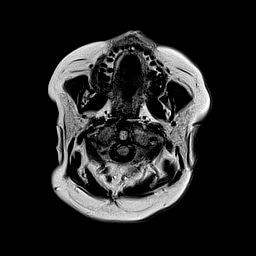
[im 12/23]
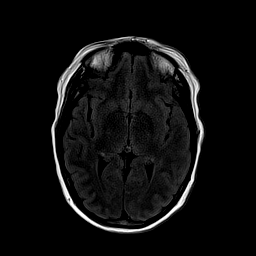
[im 23/23]
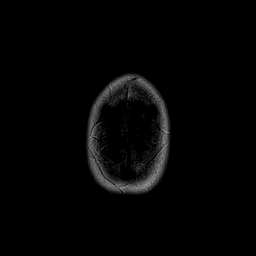

[Series 9: T1 · axial · 2.0mm · 0.47mm/px · z∈[-177,+11]mm · 11 of 95 slices shown]
[im 1/95]
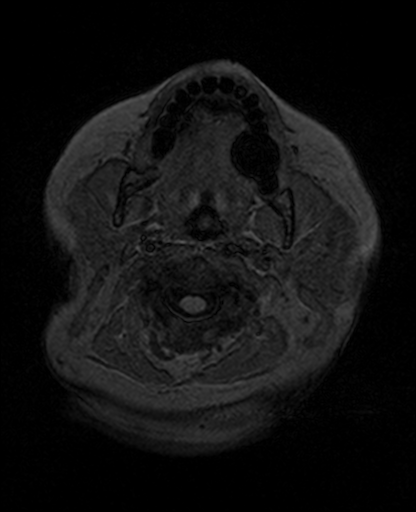
[im 10/95]
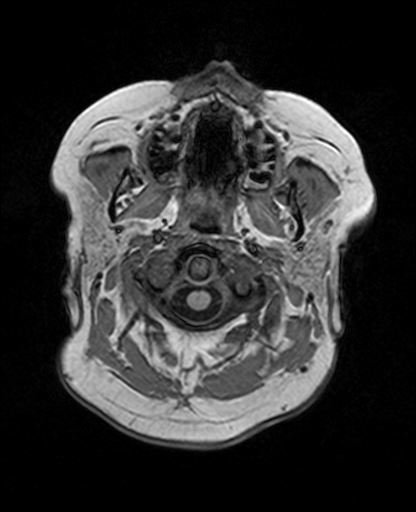
[im 19/95]
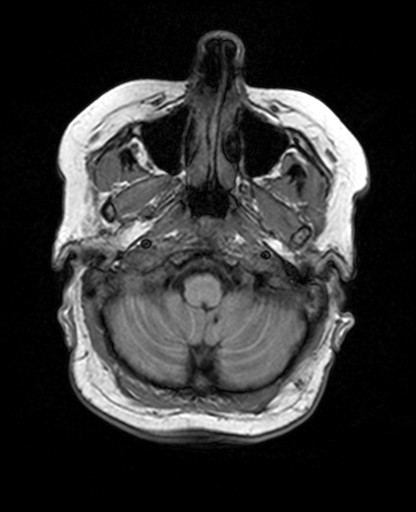
[im 29/95]
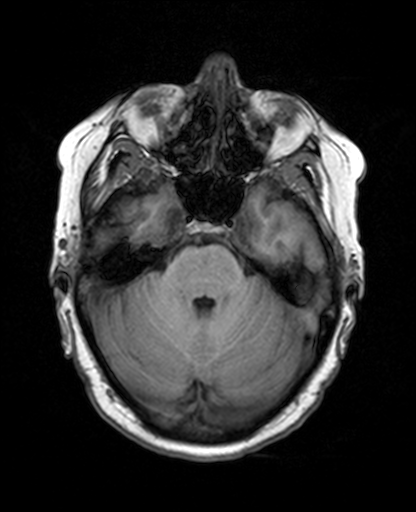
[im 38/95]
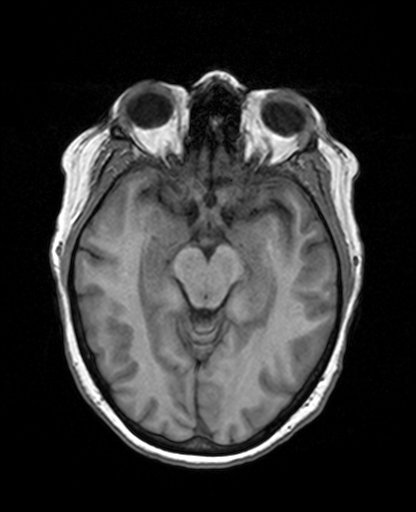
[im 48/95]
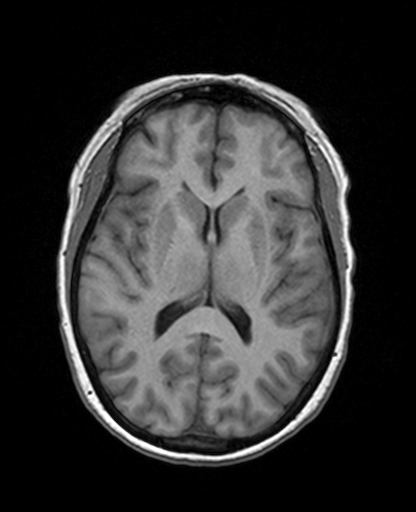
[im 57/95]
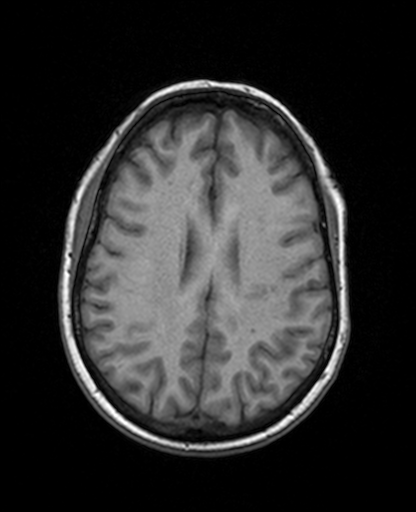
[im 66/95]
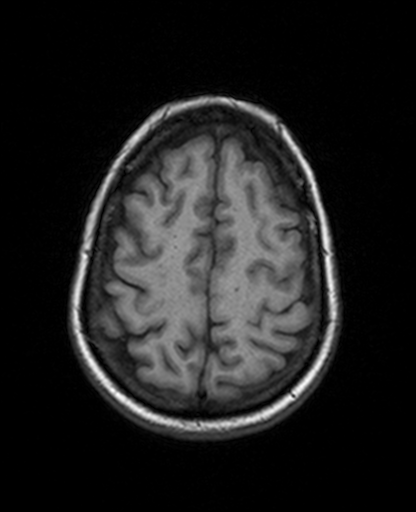
[im 76/95]
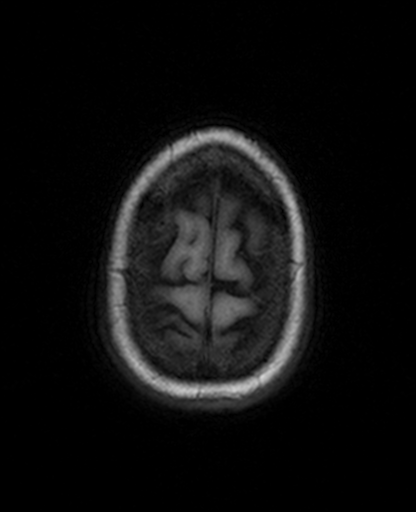
[im 85/95]
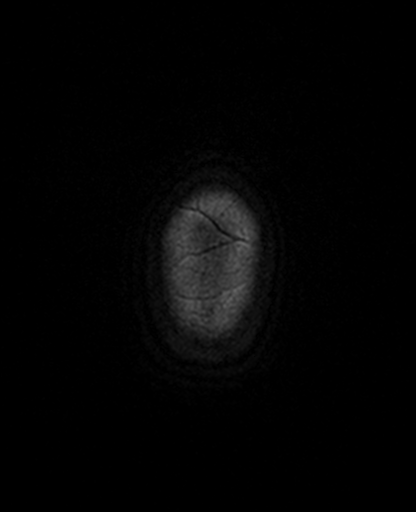
[im 95/95]
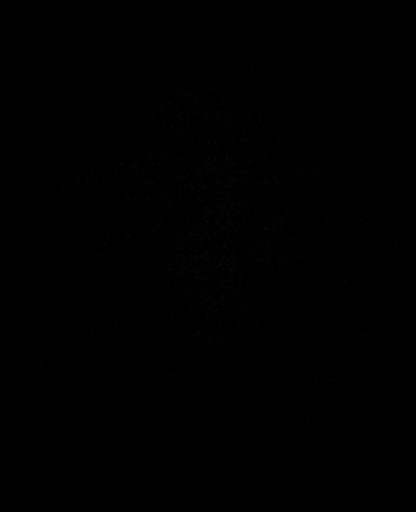

[Series 10: trauma axial · axial · 5.0mm · 0.45mm/px · z∈[-158,-28]mm · 3 of 21 slices shown]
[im 1/21]
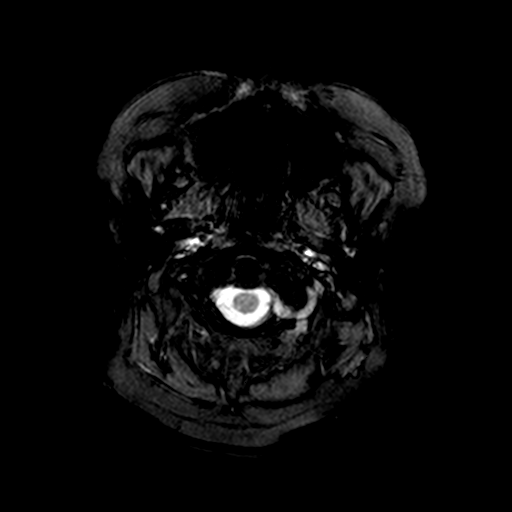
[im 11/21]
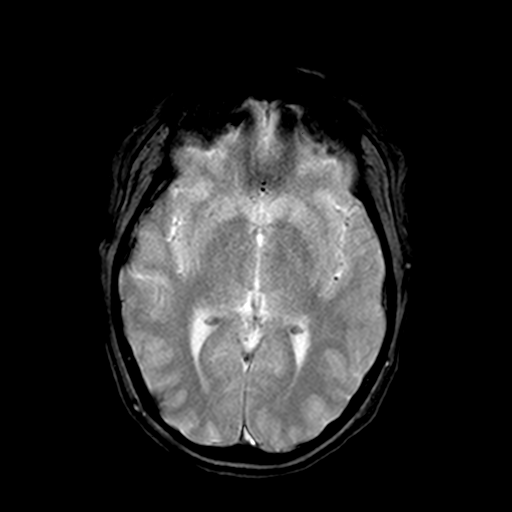
[im 21/21]
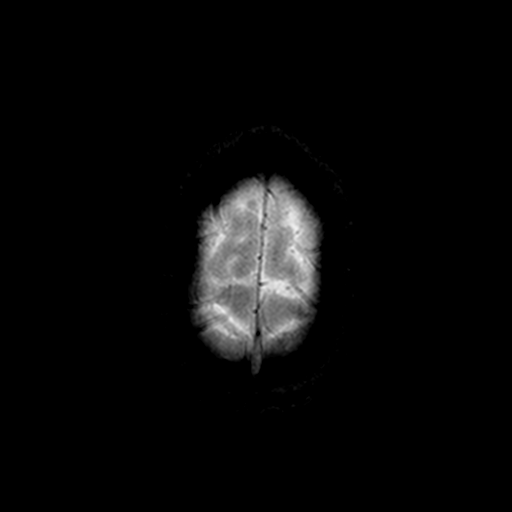

[Series 11: T2 · coronal · 5.0mm · 0.60mm/px · 3 of 26 slices shown (2 of 2)]
[im 1/26]
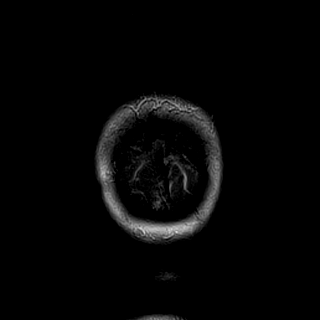
[im 13/26]
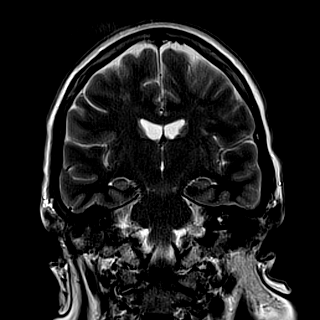
[im 26/26]
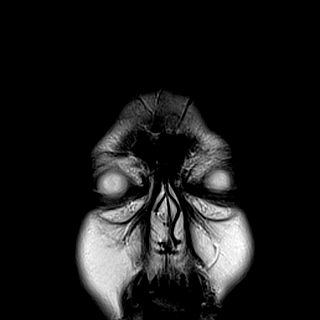

[Series 100: <mpr thick range> · axial · 3.0mm · 0.82mm/px · z∈[-155,-24]mm · 5 of 40 slices shown]
[im 1/40]
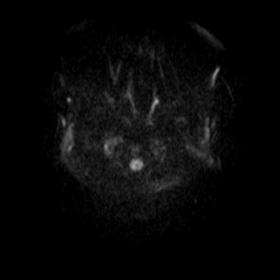
[im 10/40]
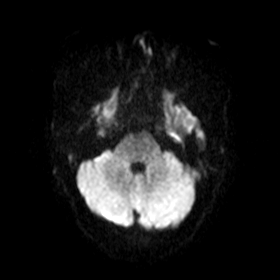
[im 20/40]
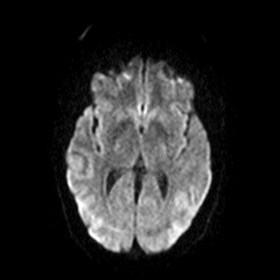
[im 30/40]
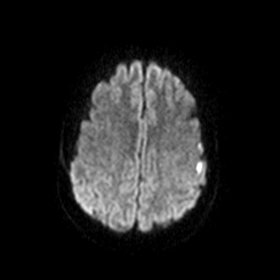
[im 40/40]
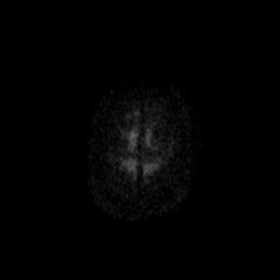

[Series 101: <mpr thick range(1)> · coronal · 3.0mm · 0.82mm/px · 2 of 55 slices shown]
[im 1/55]
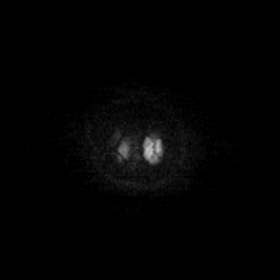
[im 10/55]
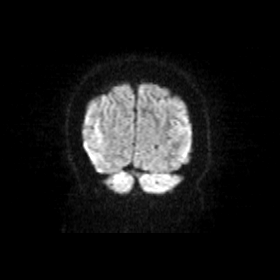

[31 of 48 positions shown; findings below may reference images not displayed]

FINDINGS: MRI HEAD FINDINGS

Multifocal areas of restricted diffusion affect the LEFT posterior
frontal cortex and subcortical white matter system consistent with
acute infarction. There is no associated hemorrhage, or midline
shift. The largest confluent area of acute infarction measures 11 x
15 mm in the periventricular white matter.

In the subcortical white matter of the RIGHT frontal lobe, image 45
series 101, there is a punctate focus of increased signal on coronal
diffusion imaging, with intermediate ADC value, associated with T2
and FLAIR hyperintensity of the cortex and subcortical white matter
in this region, favored to represent an area of subacute infarction
which is normalizing on diffusion imaging.

Normal for age cerebral volume. Minor white matter disease, likely
chronic microvascular ischemic change. No midline abnormalities.

In the LEFT cerebellar tonsil there is an area of susceptibility,
seen best on gradient sequence but also visible on T1, FLAIR, and T2
images, estimated 7 x 12 mm cross-section, which is of indeterminate
etiology, but likely chronic. A cavernoma could have this
appearance. Bold trauma or hemorrhagic infarct could appear in a
similar fashion. Flow voids are maintained throughout, including the
LEFT vertebral artery.

Extracranial soft tissues unremarkable. BILATERAL cataract
extraction.

MRA HEAD FINDINGS

Internal carotid arteries are widely patent. Basilar artery is
widely patent with vertebrals codominant. No intracranial stenosis
or aneurysm. No proximal LEFT MCA stenosis or occlusion.
IMPRESSION: Multifocal areas of nonhemorrhagic acute infarction, predominantly
affecting the LEFT posterior frontal cortex and subcortical white
matter.

Suspected subacute RIGHT frontal cortex and subcortical white matter
infarction, also without hemorrhage.

7 x 12 mm focus of chronic hemorrhage in LEFT cerebellar tonsil,
which could represent a remote hemorrhagic insult or a incidental
cavernoma.

Negative MRA.  No proximal LEFT ICA or LEFT MCA lesion is observed.

## 2017-05-23 IMAGING — MR MR MRA HEAD W/O CM
1 series · 15 of 48 positions shown · non-contrast
Comparison: CT head earlier today.

CLINICAL DATA: Right-sided weakness which began [REDACTED]. Stroke
risk factors include hypertension, and diabetes. LEFT facial and arm
numbness.

EXAM:
MRI HEAD WITHOUT CONTRAST
MRA HEAD WITHOUT CONTRAST
TECHNIQUE: Multiplanar, multiecho pulse sequences of the brain and surrounding
structures were obtained without intravenous contrast. Angiographic
images of the head were obtained using MRA technique without
contrast.

[Series 4: MRA · axial · 0.8mm · 0.38mm/px · z∈[+68,+168]mm · 15 of 131 slices shown]
[im 1/131]
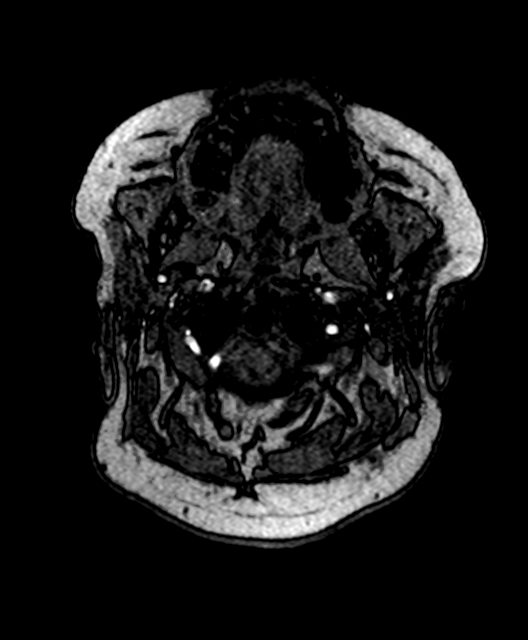
[im 3/131]
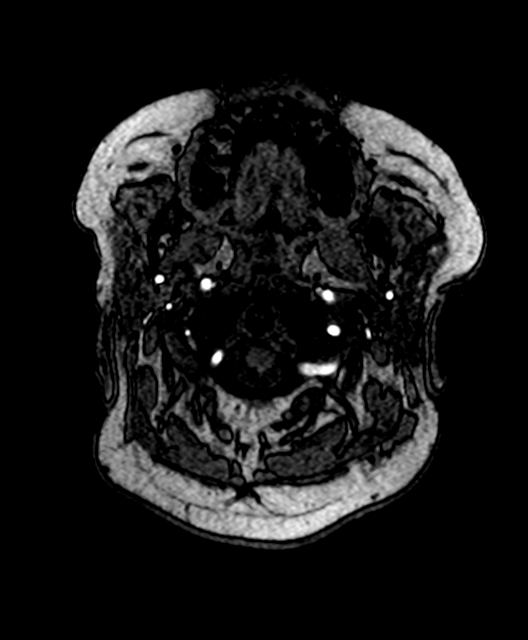
[im 6/131]
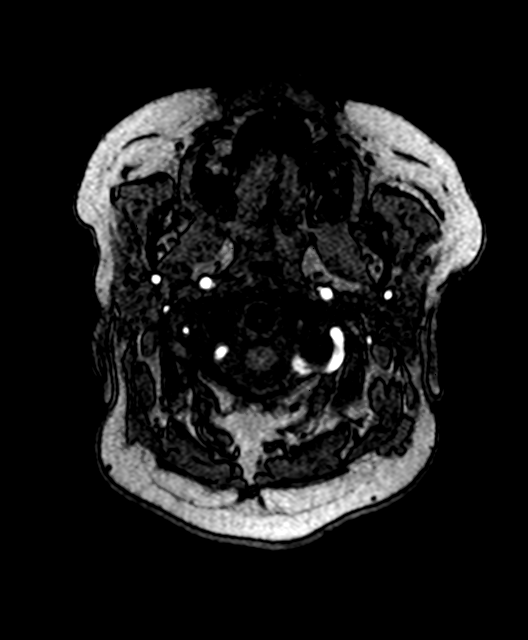
[im 9/131]
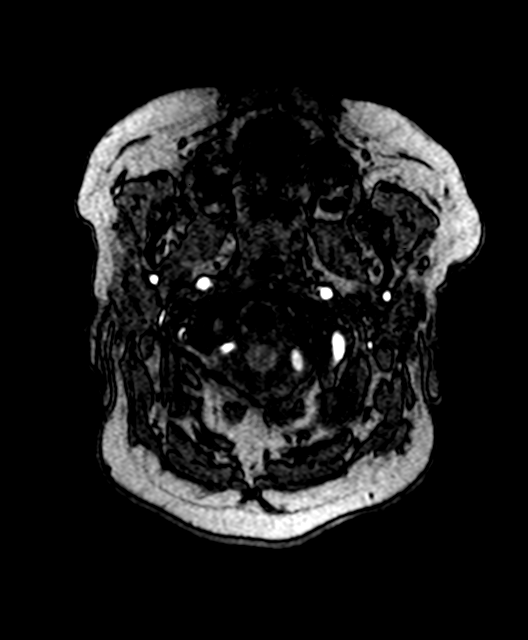
[im 12/131]
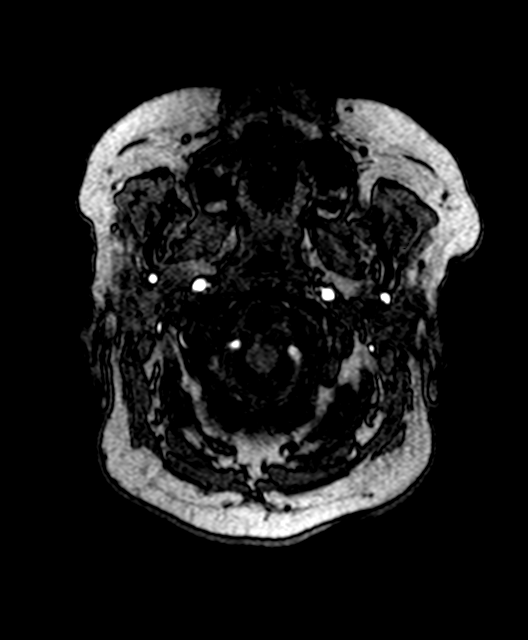
[im 23/131]
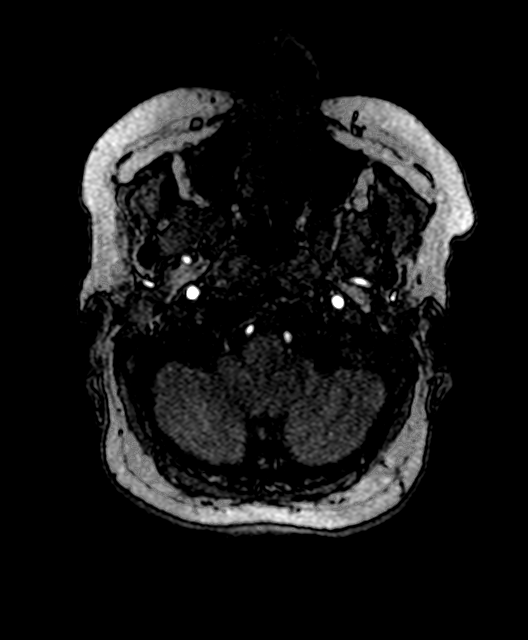
[im 25/131]
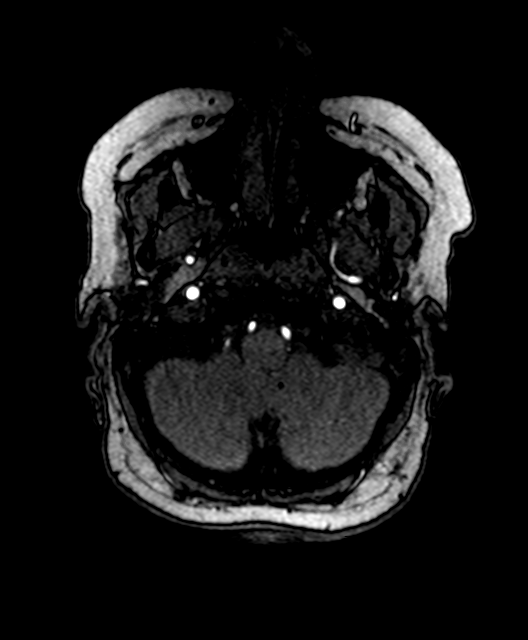
[im 42/131]
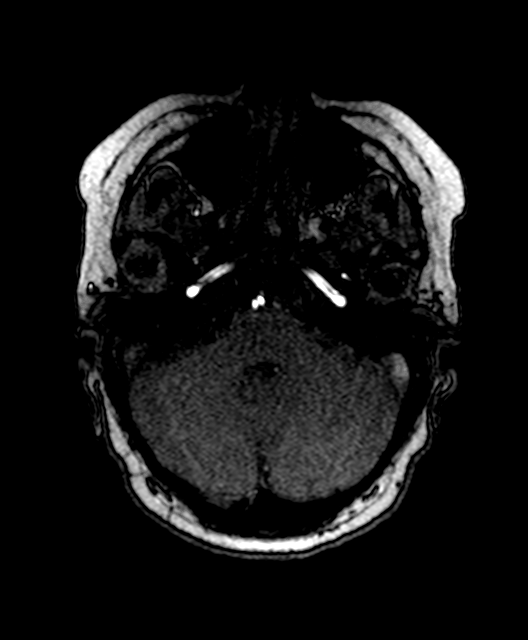
[im 59/131]
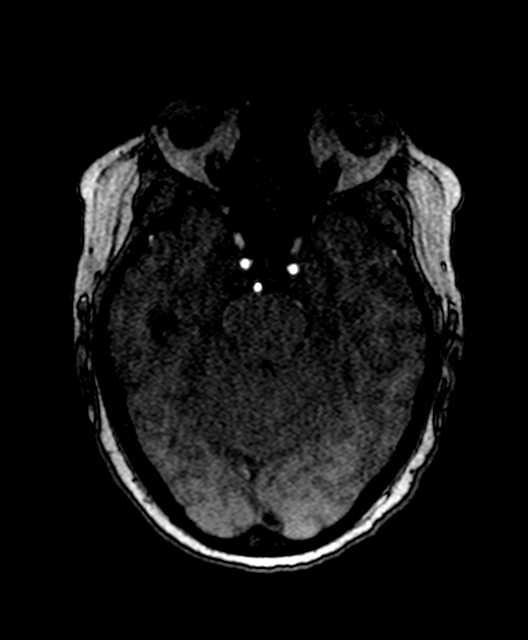
[im 67/131]
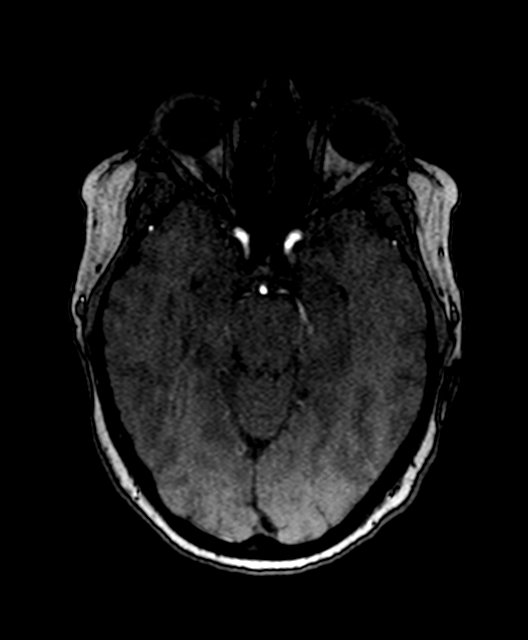
[im 75/131]
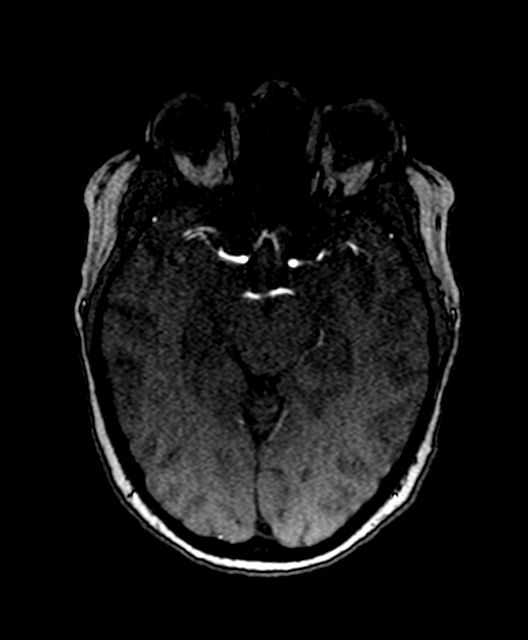
[im 92/131]
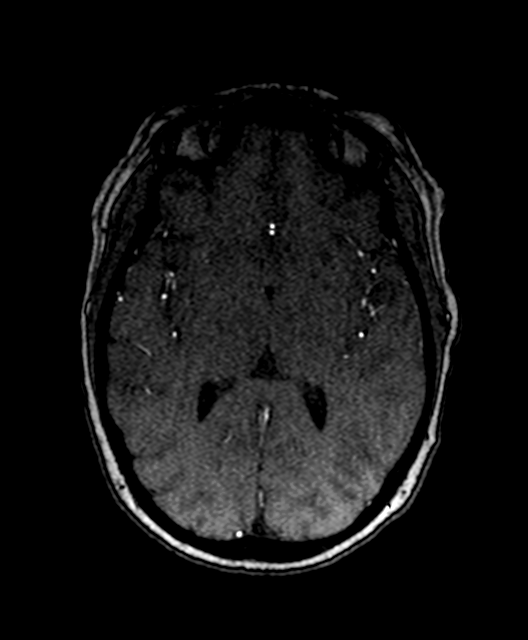
[im 108/131]
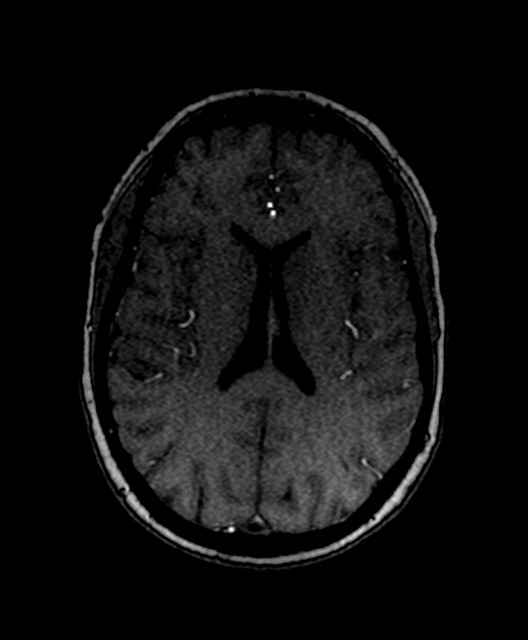
[im 111/131]
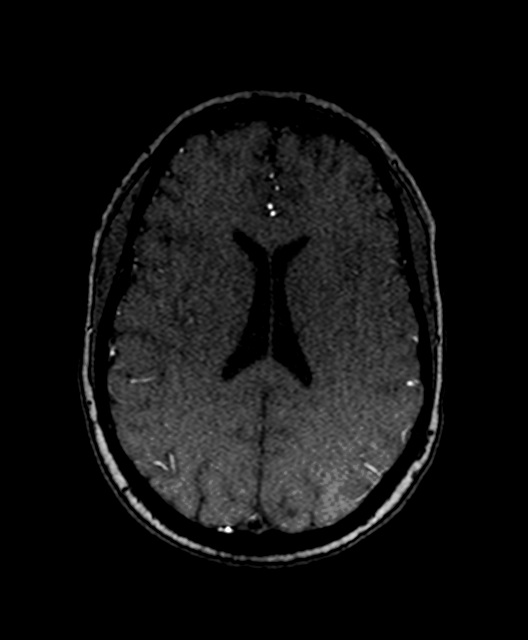
[im 125/131]
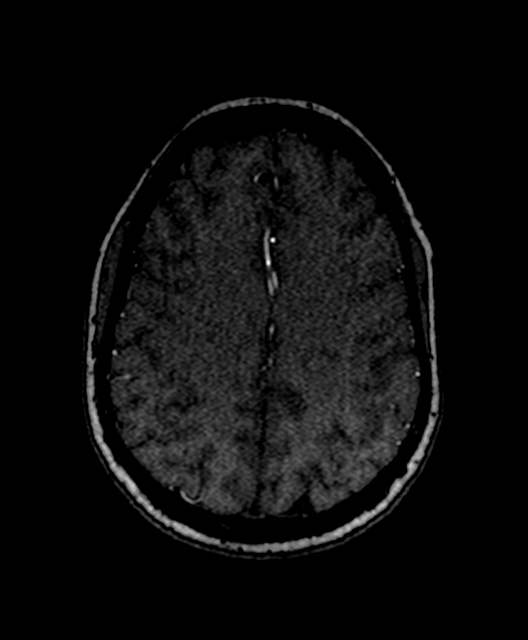

[15 of 48 positions shown; findings below may reference images not displayed]

FINDINGS: MRI HEAD FINDINGS

Multifocal areas of restricted diffusion affect the LEFT posterior
frontal cortex and subcortical white matter system consistent with
acute infarction. There is no associated hemorrhage, or midline
shift. The largest confluent area of acute infarction measures 11 x
15 mm in the periventricular white matter.

In the subcortical white matter of the RIGHT frontal lobe, image 45
series 101, there is a punctate focus of increased signal on coronal
diffusion imaging, with intermediate ADC value, associated with T2
and FLAIR hyperintensity of the cortex and subcortical white matter
in this region, favored to represent an area of subacute infarction
which is normalizing on diffusion imaging.

Normal for age cerebral volume. Minor white matter disease, likely
chronic microvascular ischemic change. No midline abnormalities.

In the LEFT cerebellar tonsil there is an area of susceptibility,
seen best on gradient sequence but also visible on T1, FLAIR, and T2
images, estimated 7 x 12 mm cross-section, which is of indeterminate
etiology, but likely chronic. A cavernoma could have this
appearance. Bold trauma or hemorrhagic infarct could appear in a
similar fashion. Flow voids are maintained throughout, including the
LEFT vertebral artery.

Extracranial soft tissues unremarkable. BILATERAL cataract
extraction.

MRA HEAD FINDINGS

Internal carotid arteries are widely patent. Basilar artery is
widely patent with vertebrals codominant. No intracranial stenosis
or aneurysm. No proximal LEFT MCA stenosis or occlusion.
IMPRESSION: Multifocal areas of nonhemorrhagic acute infarction, predominantly
affecting the LEFT posterior frontal cortex and subcortical white
matter.

Suspected subacute RIGHT frontal cortex and subcortical white matter
infarction, also without hemorrhage.

7 x 12 mm focus of chronic hemorrhage in LEFT cerebellar tonsil,
which could represent a remote hemorrhagic insult or a incidental
cavernoma.

Negative MRA.  No proximal LEFT ICA or LEFT MCA lesion is observed.

## 2017-05-23 MED ORDER — ALPRAZOLAM 0.25 MG PO TABS
0.2500 mg | ORAL_TABLET | Freq: Two times a day (BID) | ORAL | Status: DC | PRN
  Administered 2017-05-24: 0.25 mg via ORAL
  Filled 2017-05-23: qty 1

## 2017-05-23 MED ORDER — MORPHINE SULFATE (PF) 2 MG/ML IV SOLN
1.0000 mg | Freq: Once | INTRAVENOUS | Status: AC
  Administered 2017-05-23: 1 mg via INTRAVENOUS
  Filled 2017-05-23: qty 1

## 2017-05-23 MED ORDER — ZOLPIDEM TARTRATE 5 MG PO TABS
5.0000 mg | ORAL_TABLET | Freq: Every evening | ORAL | Status: DC | PRN
  Administered 2017-05-24 (×2): 5 mg via ORAL
  Filled 2017-05-23 (×2): qty 1

## 2017-05-23 MED ORDER — CLOPIDOGREL BISULFATE 75 MG PO TABS
75.0000 mg | ORAL_TABLET | Freq: Every day | ORAL | Status: DC
  Administered 2017-05-23 – 2017-05-25 (×3): 75 mg via ORAL
  Filled 2017-05-23 (×3): qty 1

## 2017-05-23 MED ORDER — TRAMADOL HCL 50 MG PO TABS: 100.0000 mg | ORAL_TABLET | Freq: Four times a day (QID) | ORAL | Status: DC | PRN

## 2017-05-23 MED ORDER — ASPIRIN 81 MG PO CHEW
324.0000 mg | CHEWABLE_TABLET | Freq: Once | ORAL | Status: AC
  Administered 2017-05-23: 324 mg via ORAL
  Filled 2017-05-23: qty 4

## 2017-05-23 MED ORDER — ENOXAPARIN SODIUM 40 MG/0.4ML ~~LOC~~ SOLN: 40.0000 mg | SUBCUTANEOUS | Status: DC

## 2017-05-23 MED ORDER — ESCITALOPRAM OXALATE 10 MG PO TABS
20.0000 mg | ORAL_TABLET | Freq: Every day | ORAL | Status: DC
  Administered 2017-05-23 – 2017-05-25 (×3): 20 mg via ORAL
  Filled 2017-05-23 (×3): qty 2

## 2017-05-23 MED ORDER — ASPIRIN EC 81 MG PO TBEC
81.0000 mg | DELAYED_RELEASE_TABLET | Freq: Every day | ORAL | Status: DC
  Administered 2017-05-24 – 2017-05-25 (×2): 81 mg via ORAL
  Filled 2017-05-23 (×2): qty 1

## 2017-05-23 MED ORDER — METOPROLOL TARTRATE 25 MG PO TABS
12.5000 mg | ORAL_TABLET | Freq: Two times a day (BID) | ORAL | Status: DC
Start: 2017-05-23 — End: 2017-05-25
  Administered 2017-05-23 – 2017-05-25 (×4): 12.5 mg via ORAL
  Filled 2017-05-23 (×4): qty 1

## 2017-05-23 MED ORDER — ALBUTEROL SULFATE 108 (90 BASE) MCG/ACT IN AEPB: 2.0000 | INHALATION_SPRAY | RESPIRATORY_TRACT | Status: DC | PRN

## 2017-05-23 MED ORDER — MORPHINE SULFATE (PF) 4 MG/ML IV SOLN
4.0000 mg | Freq: Once | INTRAVENOUS | Status: AC
  Administered 2017-05-23: 4 mg via INTRAVENOUS
  Filled 2017-05-23: qty 1

## 2017-05-23 MED ORDER — SODIUM CHLORIDE 0.9 % IV SOLN
INTRAVENOUS | Status: DC
  Administered 2017-05-23 – 2017-05-25 (×5): via INTRAVENOUS

## 2017-05-23 MED ORDER — IPRATROPIUM-ALBUTEROL 0.5-2.5 (3) MG/3ML IN SOLN: 3.0000 mL | Freq: Three times a day (TID) | RESPIRATORY_TRACT | Status: DC | PRN

## 2017-05-23 MED ORDER — ONDANSETRON HCL 4 MG/2ML IJ SOLN
4.0000 mg | Freq: Once | INTRAMUSCULAR | Status: AC
  Administered 2017-05-23: 4 mg via INTRAVENOUS
  Filled 2017-05-23: qty 2

## 2017-05-23 MED ORDER — HEPARIN (PORCINE) IN NACL 100-0.45 UNIT/ML-% IJ SOLN
900.0000 [IU]/h | INTRAMUSCULAR | Status: DC
  Administered 2017-05-23: 1100 [IU]/h via INTRAVENOUS
  Filled 2017-05-23 (×2): qty 250

## 2017-05-23 MED ORDER — NITROGLYCERIN 0.4 MG SL SUBL: 0.4000 mg | SUBLINGUAL_TABLET | SUBLINGUAL | Status: DC | PRN

## 2017-05-23 MED ORDER — ONDANSETRON HCL 4 MG/2ML IJ SOLN
4.0000 mg | Freq: Four times a day (QID) | INTRAMUSCULAR | Status: DC | PRN
Start: 2017-05-23 — End: 2017-05-25
  Administered 2017-05-24: 4 mg via INTRAVENOUS
  Filled 2017-05-23: qty 2

## 2017-05-23 MED ORDER — NITROGLYCERIN 2 % TD OINT
1.0000 [in_us] | TOPICAL_OINTMENT | Freq: Once | TRANSDERMAL | Status: AC
  Administered 2017-05-23: 1 [in_us] via TOPICAL
  Filled 2017-05-23: qty 1

## 2017-05-23 MED ORDER — ACETAMINOPHEN 325 MG PO TABS
650.0000 mg | ORAL_TABLET | ORAL | Status: DC | PRN
  Administered 2017-05-24: 650 mg via ORAL
  Filled 2017-05-23: qty 2

## 2017-05-23 MED ORDER — HEPARIN BOLUS VIA INFUSION
4000.0000 [IU] | Freq: Once | INTRAVENOUS | Status: AC
  Administered 2017-05-23: 4000 [IU] via INTRAVENOUS
  Filled 2017-05-23: qty 4000

## 2017-05-23 MED ORDER — ALBUTEROL SULFATE (2.5 MG/3ML) 0.083% IN NEBU: 2.5000 mg | INHALATION_SOLUTION | RESPIRATORY_TRACT | Status: DC | PRN

## 2017-05-23 MED ORDER — NITROGLYCERIN IN D5W 200-5 MCG/ML-% IV SOLN
0.0000 ug/min | INTRAVENOUS | Status: DC
  Administered 2017-05-23: 5 ug/min via INTRAVENOUS
  Filled 2017-05-23: qty 250

## 2017-05-23 MED ORDER — ATORVASTATIN CALCIUM 80 MG PO TABS
80.0000 mg | ORAL_TABLET | Freq: Every day | ORAL | Status: DC
  Administered 2017-05-23 – 2017-05-24 (×2): 80 mg via ORAL
  Filled 2017-05-23 (×3): qty 1

## 2017-05-23 NOTE — ED Notes (Signed)
Lab called about reordering the BMP for Pt.

## 2017-05-23 NOTE — ED Provider Notes (Signed)
[facility] DEPT Provider Note   CSN: [id number] Arrival date & time: 05/23/17  1009     History   Chief Complaint Chief Complaint  Patient presents with  . Chest Pain  . Shoulder Pain    right    HPI   Blood pressure (!) 126/92, pulse 65, temperature (!) 97.5 F (36.4 C), temperature source Oral, resp. rate 17, height 5' 5" (1.651 m), weight 83 kg (183 lb), last menstrual period 08/13/2011, SpO2 97 %.  Summer Cook is a 51 y.o. 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 She states it is pleuritic and exertional, 10 out of 10, no medication taken prior to arrival.  Past Medical History:  Diagnosis Date  . Allergy   . Asthma   . CAD (coronary artery disease), native coronary artery    a. cath 05/20/17 - 100% distal PDA --> medical mangement (no intervention done)  . Candida esophagitis ([place])   . Depression   . Essential hypertension, benign   . GERD (gastroesophageal reflux disease)   . Gout   . Hemorrhoids   . 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  Last was Prednisone 27m; 2 wk in 10/2012.  She also was on Prednisone bridged with Cellcept briefly but stopped due to lack of insurance.   . [place]monary embolism ([place] 04/2012  . Renal insufficiency   . Serrated adenoma of colon   . Steroid-induced diabetes ([place]   . Stroke ([place]   . Thrush, oral 05/29/2011    Patient Active Problem List   Diagnosis Date Noted  . Mixed  hyperlipidemia   . NSTEMI (non-ST elevated myocardial infarction) ([place] 05/20/2017  . Diabetes mellitus type 2, uncomplicated ([place] 000/37/0488 . History of stroke   . Chronic back pain 12/23/2015  . Asthma, chronic 02/16/2015  . Anxiety state 02/16/2015  . CKD (chronic kidney disease) stage 3, GFR 30-59 ml/min 04/28/2014  . Nausea with vomiting 01/25/2013  . Abnormal LFTs 01/25/2013  . Orthostatic hypotension 01/25/2013  . Rectal bleeding 12/26/2012  . History of pulmonary embolism 05/03/2012  . Hemorrhoids 06/04/2011  . 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 Immunosuppressed status ([place] 06/04/2011  . Depression 06/04/2011  . Obesity 06/04/2011  . Hypertension 03/18/2011  . Idiopathic thrombocytopenic purpura ([place] 03/18/2011    Past Surgical History:  Procedure Laterality Date  . BONE MARROW BIOPSY    . CARPAL TUNNEL RELEASE    . CARPAL TUNNEL RELEASE Left 05/20/2016   Procedure: CARPAL TUNNEL RELEASE;  Surgeon: SCarole Civil MD;  Location: [facility];  Service: Orthopedics;  Laterality: Left;  . CARPAL TUNNEL RELEASE Right 07/16/2016   Procedure: RIGHT CARPAL TUNNEL RELEASE;  Surgeon: SCarole Civil MD;  Location: [facility];  Service: Orthopedics;  Laterality: Right;  . CATARACT EXTRACTION    . CHOLECYSTECTOMY  2008  . COLONOSCOPY WITH ESOPHAGOGASTRODUODENOSCOPY (EGD) N/A 01/04/2013   Dr. RGala Romney esophageal plaques with +KOH, hh. Gastric antrum abnormal, bx reactive gastropathy. Anal canal hemorrhoids, colonic tics, normal TI, single polyp (sessile  serrated tubular adenoma). Next TCS 12/2017  . LEFT HEART CATH AND CORONARY ANGIOGRAPHY N/A 05/20/2017   Procedure: LEFT HEART CATH AND CORONARY ANGIOGRAPHY;  Surgeon: Martinique, Peter M, MD;  Location: [facility];  Service: Cardiovascular;  Laterality: N/A;  . 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 TEE WITHOUT CARDIOVERSION N/A 01/03/2016   Procedure: TRANSESOPHAGEAL ECHOCARDIOGRAM (TEE) WITH PROPOFOL;  Surgeon: Satira Sark, MD;  Location:  [facility];  Service: Cardiovascular;  Laterality: N/A;    OB History    No data available       Home Medications    Prior to Admission medications   Medication Sig Start Date End Date Taking? Authorizing Provider  escitalopram (LEXAPRO) 20 MG tablet Take 20 mg by mouth daily.  05/03/17  Yes [provider]  metoprolol tartrate (LOPRESSOR) 25 MG tablet Take 0.5 tablets (12.5 mg total) by mouth 2 (two) times daily. 05/22/17  Yes Eileen Stanford, PA-C  aspirin EC 81 MG EC tablet Take 1 tablet (81 mg total) by mouth daily. 05/23/17   Eileen Stanford, PA-C  atorvastatin (LIPITOR) 80 MG tablet Take 1 tablet (80 mg total) by mouth daily at 6 PM. 05/22/17   Eileen Stanford, PA-C  clopidogrel (PLAVIX) 75 MG tablet Take 1 tablet (75 mg total) by mouth daily. 05/23/17   Eileen Stanford, PA-C  ipratropium-albuterol (DUONEB) 0.5-2.5 (3) MG/3ML SOLN Take 3 mLs by nebulization 3 (three) times daily as needed (shorntess of breath/wheezing). Patient taking differently: Take 3 mLs by nebulization 3 (three) times daily as needed (shortness of breath/wheezing).  02/18/15   Kathie Dike, MD  PROAIR RESPICLICK 982 (90 BASE) MCG/ACT AEPB Inhale 2 puffs into the lungs every 4 (four) hours as needed (Shortness of breath).  12/27/14   [provider]    Family History Family History  Problem Relation Age of Onset  . Cervical cancer Mother   . Lung cancer Father   . Colon polyps Father        ???  . Pneumonia Brother   . Colon cancer Paternal Grandmother 88  . AAA (abdominal aortic aneurysm) Paternal Grandmother   . Breast cancer Paternal Grandmother   . Colon cancer Paternal Grandfather 61  . Barrett's esophagus Paternal Aunt   . Stomach cancer Neg Hx   . Pancreatic cancer Neg Hx     Social History Social History  Substance Use Topics  . Smoking status: Former Smoker    Years: 0.00    Types: Cigarettes    Quit date: 02/14/2009  . Smokeless tobacco: Never Used  .  Alcohol use No     Allergies   Brintellix [vortioxetine]; Yellow jacket venom; Adhesive [tape]; and Zoloft [sertraline hcl]   Review of Systems Review of Systems  A complete review of systems was obtained and all systems are negative except as noted in the HPI and PMH.   Physical Exam Updated Vital Signs BP 115/86   Pulse 61   Temp (!) 97.5 F (36.4 C) (Oral)   Resp 14   Ht 5' 5" (1.651 m)   Wt 83 kg (183 lb)   LMP 08/13/2011   SpO2 97%   BMI 30.45 kg/m   Physical Exam  Constitutional: She is oriented to person, place, and time. She appears well-developed and well-nourished. No distress.  HENT:  Head: Normocephalic.  Mouth/Throat: Oropharynx is clear and moist.  Eyes: Conjunctivae are normal.  Neck: Normal range of motion. No JVD present. No tracheal deviation present.  Cardiovascular: Normal rate, regular rhythm and intact distal pulses.   Radial pulse equal bilaterally  Pulmonary/Chest: Effort normal and breath sounds normal. No stridor. No respiratory distress. She has no wheezes. She has no rales. She exhibits no tenderness.  Abdominal: Soft. She exhibits no distension and no mass. There is no tenderness. There is no rebound and no guarding.  Musculoskeletal: Normal range of motion. She exhibits no edema or tenderness.  No calf asymmetry, superficial collaterals, palpable cords, edema, Homans sign negative bilaterally.    Neurological: She is alert and oriented to person, place, and time.  Skin: Skin is warm. She is not diaphoretic.  Psychiatric: She has a normal mood and affect.  Nursing note and vitals reviewed.    ED Treatments / Results  Labs (all labs ordered are listed, but only abnormal results are displayed) Labs Reviewed  BASIC METABOLIC PANEL - Abnormal; Notable for the following:       Result Value   Potassium 5.5 (*)    BUN 29 (*)    Creatinine, Ser 1.70 (*)    GFR calc non Af Amer 34 (*)    GFR calc Af Amer 39 (*)    All other components  within normal limits  I-STAT TROPONIN, ED - Abnormal; Notable for the following:    Troponin i, poc 0.81 (*)    All other components within normal limits  CBC  BRAIN NATRIURETIC PEPTIDE  HEPATIC FUNCTION PANEL  TROPONIN I  CK TOTAL AND CKMB (NOT AT [facility])  SEDIMENTATION RATE    EKG  EKG Interpretation  Date/Time:  Sunday May 23 2017 10:12:54 EDT Ventricular Rate:  64 PR Interval:  180 QRS Duration: 134 QT Interval:  448 QTC Calculation: 462 R Axis:   88 Text Interpretation:  Normal sinus rhythm with sinus arrhythmia Non-specific intra-ventricular conduction block Cannot rule out Anteroseptal infarct , age undetermined Abnormal ECG Confirmed by Quintella Reichert [phone]) on 05/23/2017 11:01:54 AM       Radiology Dg Chest 2 View  Result Date: 05/23/2017 CLINICAL DATA:  Shortness of breath EXAM: CHEST  2 VIEW COMPARISON:  CTA chest dated 05/19/2017 FINDINGS: Lungs are clear.  No pleural effusion or pneumothorax. The heart is normal in size. Mild superior endplate changes at U04. Cholecystectomy clips. IMPRESSION: Normal chest radiographs. Electronically Signed   By: Julian Hy M.D.   On: 05/23/2017 10:53    Procedures Procedures (including critical care time)  Medications Ordered in ED Medications  0.9 %  sodium chloride infusion ( Intravenous New Bag/Given 05/23/17 1412)  nitroGLYCERIN 50 mg in dextrose 5 % 250 mL (0.2 mg/mL) infusion (not administered)  aspirin EC tablet 81 mg (not administered)  atorvastatin (LIPITOR) tablet 80 mg (not administered)  clopidogrel (PLAVIX) tablet 75 mg (not administered)  escitalopram (LEXAPRO) tablet 20 mg (not administered)  ipratropium-albuterol (DUONEB) 0.5-2.5 (3) MG/3ML nebulizer solution 3 mL (not administered)  metoprolol tartrate (LOPRESSOR) tablet 12.5 mg (not administered)  Albuterol Sulfate AEPB 2 puff (not administered)  aspirin chewable tablet 324 mg (324 mg Oral Given 05/23/17 1129)  morphine 4 MG/ML injection 4 mg (4  mg Intravenous Given 05/23/17 1200)  ondansetron (ZOFRAN) injection 4 mg (4 mg Intravenous Given 05/23/17 1200)  nitroGLYCERIN (NITROGLYN) 2 % ointment 1 inch (1 inch Topical Given 05/23/17 1411)     Initial Impression / Assessment and Plan / ED Course  I have reviewed the triage vital signs and the nursing notes.  Pertinent labs & imaging results that were available during my care  of the patient were reviewed by me and considered in my medical decision making (see chart for details).     Vitals:   05/23/17 1300 05/23/17 1330 05/23/17 1400 05/23/17 1530  BP: 125/90 108/80 (!) 120/91 115/86  Pulse: 61 63 62 61  Resp: _0 Temp:      TempSrc:      SpO2: 94% 96% 99% 97%  Weight:      Height:        Medications  0.9 %  sodium chloride infusion ( Intravenous New Bag/Given 05/23/17 1412)  nitroGLYCERIN 50 mg in dextrose 5 % 250 mL (0.2 mg/mL) infusion (not administered)  aspirin EC tablet 81 mg (not administered)  atorvastatin (LIPITOR) tablet 80 mg (not administered)  clopidogrel (PLAVIX) tablet 75 mg (not administered)  escitalopram (LEXAPRO) tablet 20 mg (not administered)  ipratropium-albuterol (DUONEB) 0.5-2.5 (3) MG/3ML nebulizer solution 3 mL (not administered)  metoprolol tartrate (LOPRESSOR) tablet 12.5 mg (not administered)  Albuterol Sulfate AEPB 2 puff (not administered)  aspirin chewable tablet 324 mg (324 mg Oral Given 05/23/17 1129)  morphine 4 MG/ML injection 4 mg (4 mg Intravenous Given 05/23/17 1200)  ondansetron (ZOFRAN) injection 4 mg (4 mg Intravenous Given 05/23/17 1200)  nitroGLYCERIN (NITROGLYN) 2 % ointment 1 inch (1 inch Topical Given 05/23/17 1411)    Summer Cook is 51 y.o. female presenting with Left-sided chest pain, concerning for ACS. She had an STEMI last week, calf with 100% PDA occlusion, too small to stent. Patient given full dose aspirin, EKG unchanged. Pain is severe, morphine given.  Very small amount of blood work is obtained, her  potassium is elevated however it may be hemolyzed, they cannot evaluate this on the bloodwork that they have. Pain has improved after morphine.  Troponin elevated at 0.81. She is pain-free at this time. We'll wait for blood work to resolve before calling cardiology.  Patient reassessed, her chest pain has returned at 7 out of 10, systolics often in the 280K, she reacted very strongly to the morphine and is very somnolent. Will give nitroglycerin paste.  1:44 PM Called lab to see what the delay is on the proBNP, they state that they may have to run controls on the machine and will be minimal 1 hour until the proBNP is resulted, patient given nitroglycerin paste, will need to call cardiology for admission.   Discussed with Dr. Percival Spanish, states cardiology will come to evaluate the patient.Cardiology to admit the patient, consider [facility]'s.   Final Clinical Impressions(s) / ED Diagnoses   Final diagnoses:  Chest pain, unspecified type    New Prescriptions New Prescriptions   No medications on file     Waynetta Pean 05/23/17 1604    Quintella Reichert, MD 05/24/17 [phone]

## 2017-05-23 NOTE — ED Triage Notes (Signed)
Pt. Stated, I was discharged yesterday and my chest pain is back worse than ever.  My right shoulder hurts too now.

## 2017-05-23 NOTE — ED Notes (Signed)
Patient transported to X-ray 

## 2017-05-23 NOTE — Progress Notes (Signed)
ANTICOAGULATION CONSULT NOTE - Initial Consult  Pharmacy Consult for Heparin Indication: chest pain/ACS  Allergies  Allergen Reactions  . Brintellix [Vortioxetine] Itching  . Yellow Jacket Venom Swelling  . Adhesive [Tape] Rash    Please use paper tape  . Zoloft [Sertraline Hcl] Diarrhea    Patient Measurements: Height: 5\' 5"  (165.1 cm) Weight: 183 lb (83 kg) IBW/kg (Calculated) : 57 Heparin Dosing Weight: 75 kg  Vital Signs: Temp: 97.5 F (36.4 C) (08/26 1020) Temp Source: Oral (08/26 1020) BP: 107/71 (08/26 1700) Pulse Rate: 72 (08/26 1702)  Labs:  Recent Labs  05/20/17 1902 05/22/17 0308 05/23/17 1114 05/23/17 1212  HGB 12.6 12.4 13.8  --   HCT 39.9 39.1 43.1  --   PLT 200 212 169  --   CREATININE 1.78* 2.06*  --  1.70*    Estimated Creatinine Clearance: 41.7 mL/min (A) (by C-G formula based on SCr of 1.7 mg/dL (H)).   Medical History: Past Medical History:  Diagnosis Date  . Allergy   . Asthma   . CAD (coronary artery disease), native coronary artery    a. cath 05/20/17 - 100% distal PDA --> medical mangement (no intervention done)  . Candida esophagitis ([place])   . Depression   . Essential hypertension, benign   . GERD (gastroesophageal reflux disease)   . Gout   . Hemorrhoids   . 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  Last was Prednisone 40mg ; 2 wk in 10/2012.  She also was on Prednisone bridged with Cellcept briefly but stopped due to lack of insurance.   . Pulmonary embolism ([place]) 04/2012  . Renal insufficiency   . Serrated adenoma of colon   . Steroid-induced diabetes ([place])   . Stroke ([place])   . Thrush, oral 05/29/2011    Assessment:  CC/HPI: Recurrent CP, recent NSTEMI (cath 8/23), Troponin trending down, ECG with no changes,   PMH: CVA, HTN, depression, CKD stage III and ITP s/p splenectomy, hx PE 2013, allergies, asthma, CAD,  candida esophagitis, GERD, gout, hemorrhoids, ITP, CRI, Serrated adenoma of colon, steroid-induced DM,   Significant events: NSTEMI 08/22-08/25/2018, cath showing 100% distal PDA, otherwise nl cors>>med mgt. She was d/c'd on ASA, BB, Plavix, statin. No ACE/ARB due to CKD,  Anticoag: IV heparin for recurring CP. Current CBC WNL. Noted h/o ITP.  Goal of Therapy:  Heparin level 0.3-0.7 units/ml Monitor platelets by anticoagulation protocol: Yes   Plan:  Heparin 4000 unit bolus Heparin infusion 1100 units/hr Daily HL and CBC  Makinsley Schiavi S. Alford Highland, PharmD, BCPS Clinical Staff Pharmacist Pager [phone]  Eilene Ghazi Stillinger 05/23/2017,5:23 PM

## 2017-05-23 NOTE — H&P (Signed)
Cardiology Admission History and Physical:   Patient ID: Summer Cook; MRN: [id number]; DOB: 1966-09-25   Admission date: 05/23/2017  Primary Care Provider: Redmond School, MD Primary Cardiologist: Dr Domenic Polite Primary Electrophysiologist:  n/a  Chief Complaint:  Chest pain  Patient Profile:   Summer Cook is a 51 y.o. female with a history of  CVA, HTN, depression, CKD stage III and ITP s/p splenectomy, hx PE 2013 when plt were 11,000, was on coumadin but no longer anticoagulated and subsequent CTs negative, who was hospitalized with a NSTEMI 08/22-08/25/2018.   History of Present Illness:   Summer Cook had a cath showing 100% distal PDA, otherwise nl cors>>med mgt. She was d/c'd on ASA, BB, Plavix, statin. No ACE/ARB due to CKD, VS were stable.   She felt fine when she went home. This am, she was wakened by chest pain, like her previous chest pain. Severe, pressure in the middle with some sharp pain as well, located in the Left-center chest. She also had L lateral chest pain and tingling. Pain worse with deep inspiration, no change if she lays on her L side, may be a little better sitting up.   She did not try any medications for the pain. She was concerned so she came to the ER. In the ER, nitro paste not much help, morphine made her sleepy but did not do anything for the pain.    Past Medical History:  Diagnosis Date  . Allergy   . Asthma   . CAD (coronary artery disease), native coronary artery    a. cath 05/20/17 - 100% distal PDA --> medical mangement (no intervention done)  . Candida esophagitis ([place])   . Depression   . Essential hypertension, benign   . GERD (gastroesophageal reflux disease)   . Gout   . Hemorrhoids   . 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  Last was Prednisone '40mg'$ ; 2 wk in 10/2012.  She also was on Prednisone bridged with Cellcept  briefly but stopped due to lack of insurance.   . Pulmonary embolism ([place]) 04/2012  . Renal insufficiency   . Serrated adenoma of colon   . Steroid-induced diabetes ([place])   . Stroke ([place])   . Thrush, oral 05/29/2011    Past Surgical History:  Procedure Laterality Date  . BONE MARROW BIOPSY    . CARPAL TUNNEL RELEASE    . CARPAL TUNNEL RELEASE Left 05/20/2016   Procedure: CARPAL TUNNEL RELEASE;  Surgeon: Carole Civil, MD;  Location: [facility];  Service: Orthopedics;  Laterality: Left;  . CARPAL TUNNEL RELEASE Right 07/16/2016   Procedure: RIGHT CARPAL TUNNEL RELEASE;  Surgeon: Carole Civil, MD;  Location: [facility];  Service: Orthopedics;  Laterality: Right;  . CATARACT EXTRACTION    . CHOLECYSTECTOMY  2008  . COLONOSCOPY WITH ESOPHAGOGASTRODUODENOSCOPY (EGD) N/A 01/04/2013   Dr. Gala Romney: esophageal plaques with +KOH, hh. Gastric antrum abnormal, bx reactive gastropathy. Anal canal hemorrhoids, colonic tics, normal TI, single polyp (sessile serrated tubular adenoma). Next TCS 12/2017  . LEFT HEART CATH AND CORONARY ANGIOGRAPHY N/A 05/20/2017   Procedure: LEFT HEART CATH AND CORONARY ANGIOGRAPHY;  Surgeon: Martinique, Peter M, MD;  Location: [facility] [facility];  Service: Cardiovascular;  Laterality: N/A;  . 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 TEE WITHOUT CARDIOVERSION N/A 01/03/2016   Procedure: TRANSESOPHAGEAL ECHOCARDIOGRAM (TEE) WITH PROPOFOL;  Surgeon: Satira Sark, MD;  Location: [facility];  Service: Cardiovascular;  Laterality: N/A;     Medications Prior to Admission: Prior to Admission medications   Medication Sig Start Date End Date Taking? Authorizing Provider  aspirin EC 81 MG EC tablet Take 1 tablet (81 mg total) by mouth daily. 05/23/17   Eileen Stanford, PA-C  atorvastatin (LIPITOR) 80 MG tablet Take 1 tablet (80 mg total) by mouth daily at 6 PM. 05/22/17   Eileen Stanford, PA-C  clopidogrel (PLAVIX) 75 MG tablet Take 1 tablet (75 mg total) by mouth daily. 05/23/17    Eileen Stanford, PA-C  escitalopram (LEXAPRO) 20 MG tablet Take 20 mg by mouth daily.  05/03/17   [provider]  ipratropium-albuterol (DUONEB) 0.5-2.5 (3) MG/3ML SOLN Take 3 mLs by nebulization 3 (three) times daily as needed (shorntess of breath/wheezing). Patient taking differently: Take 3 mLs by nebulization 3 (three) times daily as needed (shortness of breath/wheezing).  02/18/15   Kathie Dike, MD  metoprolol tartrate (LOPRESSOR) 25 MG tablet Take 0.5 tablets (12.5 mg total) by mouth 2 (two) times daily. 05/22/17   Eileen Stanford, PA-C  PROAIR RESPICLICK 195 (90 BASE) MCG/ACT AEPB Inhale 2 puffs into the lungs every 4 (four) hours as needed (Shortness of breath).  12/27/14   [provider]     Allergies:    Allergies  Allergen Reactions  . Brintellix [Vortioxetine] Itching  . Yellow Jacket Venom Swelling  . Adhesive [Tape] Rash    Please use paper tape  . Zoloft [Sertraline Hcl] Diarrhea    Social History:   Social History   Social History  . Marital status: Divorced    Spouse name: N/A  . Number of children: 1  . Years of education: N/A   Occupational History  .  Unemployed    was a Educational psychologist; last worked 2012.    Social History Main Topics  . Smoking status: Former Smoker    Years: 0.00    Types: Cigarettes    Quit date: 02/14/2009  . Smokeless tobacco: Never Used  . Alcohol use No  . Drug use: No  . Sexual activity: No   Other Topics Concern  . Not on file   Social History Narrative   Lives with her daughter and aunt in a one story home.  Has 1 daughter.  On disability.  Did work as a Optometrist and bus Geophysicist/field seismologist.  Education: some college.     Family History:   The patient's family history includes AAA (abdominal aortic aneurysm) in her paternal grandmother; Barrett's esophagus in her paternal aunt; Breast cancer in her paternal grandmother; Cervical cancer in her mother; Colon cancer (age of onset: 75) in her paternal grandfather;  Colon cancer (age of onset: 30) in her paternal grandmother; Colon polyps in her father; Lung cancer in her father; Pneumonia in her brother. There is no history of Stomach cancer or Pancreatic cancer.   Family status indicated that her mother is deceased. She indicated that her father is deceased. She indicated that both of her sisters are alive. She indicated that three of her five brothers are alive. She indicated that the status of her paternal grandmother is unknown. She indicated that the status of her paternal grandfather is unknown. She indicated that the status of her paternal aunt is unknown. She indicated that the status of her neg hx is unknown.     ROS:  Please see the history of present illness.  All other ROS reviewed and negative.     Physical  Exam/Data:   Vitals:   05/23/17 1200 05/23/17 1300 05/23/17 1330 05/23/17 1400  BP: (!) 131/95 125/90 108/80 (!) 120/91  Pulse: (!) 57 61 63 62  Resp: '12 16 18 17  '$ Temp:      TempSrc:      SpO2: 100% 94% 96% 99%  Weight:      Height:       No intake or output data in the 24 hours ending 05/23/17 1506 Filed Weights   05/23/17 1022  Weight: 183 lb (83 kg)   Body mass index is 30.45 kg/m.  General:  Well nourished, well developed, in mild distress HEENT: normal Lymph: no adenopathy Neck: no JVD Endocrine:  No thryomegaly Vascular: No carotid bruits; 4/4 extremity pulses 2+  Cardiac:  normal S1, S2; RRR; no murmur, no rub Lungs:  clear to auscultation bilaterally, no wheezing, rhonchi or rales; chest wall tenderness mid L of sternum and laterally  Abd: soft, nontender, no hepatomegaly  Ext: no edema Musculoskeletal:  No deformities, BUE and BLE strength normal and equal Skin: warm and dry  Neuro:  CNs 2-12 intact, no focal abnormalities noted Psych:  Normal affect    EKG:  The ECG that was done 08/26 was personally reviewed and demonstrates SR, non specific IVCD w/ QRS duration 134 ms, no change from 08/22  Relevant  CV Studies: ECHO: 05/21/2017 - Left ventricle: The cavity size was normal. Systolic function was   normal. Wall motion was normal; there were no regional wall   motion abnormalities. Doppler parameters are consistent with   abnormal left ventricular relaxation (grade 1 diastolic   dysfunction). Doppler parameters are consistent with elevated   ventricular end-diastolic filling pressure. - Aortic valve: There was no regurgitation. - Mitral valve: There was mild regurgitation. - Right ventricle: The cavity size was normal. Wall thickness was   normal. Systolic function was normal. - Right atrium: The atrium was normal in size. - Tricuspid valve: There was no regurgitation. - Pulmonic valve: There was no regurgitation. - Pulmonary arteries: Systolic pressure could not be accurately   estimated. - Inferior vena cava: The vessel was normal in size. - Pericardium, extracardiac: There was no pericardial effusion.  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  The left ventricular systolic function is normal.  LV end diastolic pressure is normal.  The left ventricular ejection fraction is 55-65% by visual estimate. Very small focal distal inferior HK. 1. 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 Otherwise normal coronary arteries. 3. Normal LV function with very small focal area of distal inferior hypokinesis. Plan: medical therapy. Diagnostic Diagram     Laboratory Data:  Chemistry Recent Labs Lab 05/22/17 0308 05/23/17 1212  NA 141 136  K 4.9 5.5*  CL 108 105  CO2 27 24  GLUCOSE 124* 79  BUN 33* 29*  CREATININE 2.06* 1.70*  CALCIUM 8.8* 9.2  GFRNONAA 27* 34*  GFRAA 31* 39*  ANIONGAP 6 7     Recent Labs Lab 05/22/17 0308 05/23/17 1212  PROT 5.8* 6.8  ALBUMIN 3.1* 3.6  AST 26 27  ALT 15 19  ALKPHOS 86 92  BILITOT 0.4 0.6   Hematology Recent Labs Lab 05/22/17 0308 05/23/17 1114  WBC 11.0* 6.3  RBC 4.41 4.83  HGB 12.4 13.8  HCT  39.1 43.1  MCV 88.7 89.2  MCH 28.1 28.6  MCHC 31.7 32.0  RDW 14.2 14.4  PLT 212 169   Cardiac Enzymes Recent Labs Lab 05/19/17 2216 05/20/17  8250 05/20/17 1040  TROPONINI 0.55* 1.69* 2.28*    Recent Labs Lab 05/19/17 1609 05/23/17 1122  TROPIPOC 0.00 0.81*    BNP Recent Labs Lab 05/23/17 1212  BNP 43.5    DDimer  Recent Labs Lab 05/19/17 1616  DDIMER 0.55*    Radiology/Studies:  Dg Chest 2 View  Result Date: 05/23/2017 CLINICAL DATA:  Shortness of breath EXAM: CHEST  2 VIEW COMPARISON:  CTA chest dated 05/19/2017 FINDINGS: Lungs are clear.  No pleural effusion or pneumothorax. The heart is normal in size. Mild superior endplate changes at N39. Cholecystectomy clips. IMPRESSION: Normal chest radiographs. Electronically Signed   By: Julian Hy M.D.   On: 05/23/2017 10:53    Assessment and Plan:   1. Chest pain:  - recent NSTEMI w/ peak trop 2.28, med rx for distal PDA occlusion - troponin is continuing to trend down. - ECG no changes, but sx and exam concerning for MS pain, ?Dressler's - discuss with MD if Tramadol could be used. - In the meantime, use IV Nitro to see if this helps her pain - focus on pain management - will check CK/MB and ESR  Severity of Illness: The appropriate patient status for this patient is OBSERVATION. Observation status is judged to be reasonable and necessary in order to provide the required intensity of service to ensure the patient's safety. The patient's presenting symptoms, physical exam findings, and initial radiographic and laboratory data in the context of their medical condition is felt to place them at decreased risk for further clinical deterioration. Furthermore, it is anticipated that the patient will be medically stable for discharge from the hospital within 2 midnights of admission. The following factors support the patient status of observation.   " The patient's presenting symptoms include chest pain. " The  physical exam findings include chest wall tenderness. " The initial radiographic and laboratory data are not acute.    Augusto Garbe  05/23/2017 3:06 PM   Patient seen and examined with Rosaria Ferries, PA-C. We discussed all aspects of the encounter. I agree with the assessment and plan as stated above.   51 y/o woman with recent admission for small NSTEMI with very distal occlusion of PDA - otherwise normal coronary arteries.  (cath films viewed personally)   Presents today with recurrent CP radiating to right shoulder + pleuritic component. ECG with iLBBB without change. Troponin trending down to 0.8.   Too early for Dressler's syndrome. May be some residual ischemia though the area of infarct very small. Will watch overnight. Cycle trops. Give NTG. Will heparinize for now. Ok to use tramadol .  Glori Bickers, MD  5:05 PM

## 2017-05-23 NOTE — Progress Notes (Signed)
Patient complaining of a headache.  Patient is on IV nitroglycerin.  Per patient was here recently and had a headache from the nitroglycerin and tylenol and ultram are ineffective.  Cardiology paged with this information.

## 2017-05-23 NOTE — ED Notes (Signed)
Called main lab added Hepatic panel to blood in main lab.

## 2017-05-24 DIAGNOSIS — I214 Non-ST elevation (NSTEMI) myocardial infarction: Secondary | ICD-10-CM | POA: Diagnosis not present

## 2017-05-24 DIAGNOSIS — R079 Chest pain, unspecified: Secondary | ICD-10-CM | POA: Diagnosis not present

## 2017-05-24 LAB — URINALYSIS, ROUTINE W REFLEX MICROSCOPIC
BILIRUBIN URINE: NEGATIVE
Glucose, UA: NEGATIVE mg/dL
KETONES UR: NEGATIVE mg/dL
Nitrite: POSITIVE — AB
PROTEIN: NEGATIVE mg/dL
SPECIFIC GRAVITY, URINE: 1.01 (ref 1.005–1.030)
pH: 5 (ref 5.0–8.0)

## 2017-05-24 LAB — HEPARIN LEVEL (UNFRACTIONATED)
Heparin Unfractionated: 0.97 IU/mL — ABNORMAL HIGH (ref 0.30–0.70)
Heparin Unfractionated: 0.33 IU/mL (ref 0.30–0.70)

## 2017-05-24 LAB — CBC
HEMATOCRIT: 39.8 % (ref 36.0–46.0)
HEMOGLOBIN: 12.6 g/dL (ref 12.0–15.0)
MCH: 28.6 pg (ref 26.0–34.0)
MCHC: 31.7 g/dL (ref 30.0–36.0)
MCV: 90.2 fL (ref 78.0–100.0)
Platelets: 202 10*3/uL (ref 150–400)
RBC: 4.41 MIL/uL (ref 3.87–5.11)
RDW: 14.6 % (ref 11.5–15.5)
WBC: 9.8 10*3/uL (ref 4.0–10.5)

## 2017-05-24 MED ORDER — ISOSORBIDE DINITRATE 10 MG PO TABS
10.0000 mg | ORAL_TABLET | Freq: Two times a day (BID) | ORAL | Status: DC
  Administered 2017-05-24 – 2017-05-25 (×3): 10 mg via ORAL
  Filled 2017-05-24 (×3): qty 1

## 2017-05-24 MED ORDER — DEXTROSE 5 % IV SOLN
1.0000 g | INTRAVENOUS | Status: DC
  Administered 2017-05-25: 1 g via INTRAVENOUS
  Filled 2017-05-24: qty 10

## 2017-05-24 NOTE — Progress Notes (Signed)
Progress Note  Patient Name: Summer Cook Date of Encounter: 05/24/2017  Primary Cardiologist: Dr. Domenic Polite  Subjective   Patient did not have any chest pains after IV nitro and Heparin were started. She developed a head acne and had a hard time sleeping. Overall, she is feeling well and had no recurrence of chest pain.    Inpatient Medications    Scheduled Meds: . aspirin EC  81 mg Oral Daily  . atorvastatin  80 mg Oral q1800  . clopidogrel  75 mg Oral Daily  . escitalopram  20 mg Oral Daily  . metoprolol tartrate  12.5 mg Oral BID   Continuous Infusions: . sodium chloride 100 mL/hr at 05/24/17 0004  . heparin 900 Units/hr (05/24/17 0342)  . nitroGLYCERIN 5 mcg/min (05/23/17 1854)   PRN Meds: acetaminophen, albuterol, ALPRAZolam, ipratropium-albuterol, nitroGLYCERIN, ondansetron (ZOFRAN) IV, traMADol, zolpidem   Vital Signs    Vitals:   05/24/17 0335 05/24/17 0350 05/24/17 0400 05/24/17 0406  BP: 100/73 110/85 102/74 102/74  Pulse:    (!) 56  Resp:    13  Temp:    97.8 F (36.6 C)  TempSrc:      SpO2:    94%  Weight:    187 lb 4.8 oz (85 kg)  Height:        Intake/Output Summary (Last 24 hours) at 05/24/17 0756 Last data filed at 05/24/17 0600  Gross per 24 hour  Intake           1349.5 ml  Output             1000 ml  Net            349.5 ml   Filed Weights   05/23/17 1022 05/23/17 1817 05/24/17 0406  Weight: 183 lb (83 kg) 186 lb (84.4 kg) 187 lb 4.8 oz (85 kg)    Telemetry    Sinus bradycardia (rates in the mid 50s) occasional PVC - Personally Reviewed   Physical Exam   GEN: Well nourished, well developed HEENT: normal  Neck: no JVD, carotid bruits, or masses Cardiac: RRR. no murmurs, rubs, or gallops,no edema. Intact distal pulses bilaterally.  Respiratory: clear to auscultation bilaterally, normal work of breathing GI: soft, nontender, nondistended, + BS MS: no deformity or atrophy  Skin: warm and dry, no rash Neuro: Alert and  Oriented x 3, Strength and sensation are intact Psych:   Full affect  Labs    Chemistry Recent Labs Lab 05/20/17 0433 05/20/17 1902 05/22/17 0308 05/23/17 1212 05/23/17 1610  NA 137  --  141 136  --   K 4.3  --  4.9 5.5*  --   CL 104  --  108 105  --   CO2 24  --  27 24  --   GLUCOSE 90  --  124* 79  --   BUN 29*  --  33* 29*  --   CREATININE 1.58* 1.78* 2.06* 1.70*  --   CALCIUM 8.8*  --  8.8* 9.2  --   PROT 6.7  --  5.8* 6.8 6.9  ALBUMIN 3.5  --  3.1* 3.6 3.4*  AST 31  --  26 27 29   ALT 16  --  15 19 18   ALKPHOS 83  --  86 92 89  BILITOT 0.6  --  0.4 0.6 0.6  GFRNONAA 37* 32* 27* 34*  --   GFRAA 43* 37* 31* 39*  --   ANIONGAP 9  --  6 7  --      Hematology Recent Labs Lab 05/22/17 0308 05/23/17 1114 05/24/17 0210  WBC 11.0* 6.3 9.8  RBC 4.41 4.83 4.41  HGB 12.4 13.8 12.6  HCT 39.1 43.1 39.8  MCV 88.7 89.2 90.2  MCH 28.1 28.6 28.6  MCHC 31.7 32.0 31.7  RDW 14.2 14.4 14.6  PLT 212 169 202    Cardiac Enzymes Recent Labs Lab 05/19/17 2216 05/20/17 0433 05/20/17 1040 05/23/17 1610  TROPONINI 0.55* 1.69* 2.28* 0.74*    Recent Labs Lab 05/19/17 1609 05/23/17 1122  TROPIPOC 0.00 0.81*     BNP Recent Labs Lab 05/23/17 1212  BNP 43.5     DDimer  Recent Labs Lab 05/19/17 1616  DDIMER 0.55*     Radiology    Dg Chest 2 View  Result Date: 05/23/2017 CLINICAL DATA:  Shortness of breath EXAM: CHEST  2 VIEW COMPARISON:  CTA chest dated 05/19/2017 FINDINGS: Lungs are clear.  No pleural effusion or pneumothorax. The heart is normal in size. Mild superior endplate changes at E95. Cholecystectomy clips. IMPRESSION: Normal chest radiographs. Electronically Signed   By: Julian Hy M.D.   On: 05/23/2017 10:53    Cardiac Studies   ECHO: 05/21/2017 - Left ventricle: The cavity size was normal. Systolic function was normal. Wall motion was normal; there were no regional wall motion abnormalities. Doppler parameters are consistent  with abnormal left ventricular relaxation (grade 1 diastolic dysfunction). Doppler parameters are consistent with elevated ventricular end-diastolic filling pressure. - Aortic valve: There was no regurgitation. - Mitral valve: There was mild regurgitation. - Right ventricle: The cavity size was normal. Wall thickness was normal. Systolic function was normal. - Right atrium: The atrium was normal in size. - Tricuspid valve: There was no regurgitation. - Pulmonic valve: There was no regurgitation. - Pulmonary arteries: Systolic pressure could not be accurately estimated. - Inferior vena cava: The vessel was normal in size. - Pericardium, extracardiac: There was no pericardial effusion.  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  The left ventricular systolic function is normal.  LV end diastolic pressure is normal.  The left ventricular ejection fraction is 55-65% by visual estimate.Very small focal distal inferior HK. 1. 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 Otherwise normal coronary arteries. 3. Normal LV function with very small focal area of distal inferior hypokinesis. Plan: medical therapy. Diagnostic Diagram       Patient Profile     Summer Cook is a 51 y.o. female with a history of CVA, HTN, depression, CKD stage III and ITP s/p splenectomy, hx PE 2013 when plt were 11,000, was on coumadin but no longer anticoagulated and subsequent CTs negative, who was hospitalized with a NSTEMI 08/22-08/25/2018.   Assessment & Plan     1. Chest pain: - Recent NSTEMI w/peak trop 2.28, medical therapy rx for distal PDA occlusion. Troponin this admission 0.81 >> 0.74.  No further troponin values available. - Troponin has trended down. Likely still decreasing from prior NSTEMI. - No EKG changes - CK, MB and SED rate wnl. - Is on IV Nitro and Heparin. She has been pain free throughout the night, indication for further ischemic  work-up at this time given resolution of chest pain and Troponins not elevating. -- Cannot titrate BB further because of heart rate. -- dc heparin and nitro, Will start on Isodril 10 mg BID. -- probably dc for DC tomorrow.  Kristopher Glee, PA-C  05/24/2017, 7:56 AM  Patient examined chart reviewed. Eating breakfast. Exam with no murmur clear lungs and good right radial pulse Post cath. D/c iv nitro and heparin start isordil 10 bid Plan d/c in am if ambulatory and no headache  Jenkins Rouge

## 2017-05-24 NOTE — Care Management Obs Status (Signed)
[place] NOTIFICATION   Patient Details  Name: RAYLA PEMBER MRN: [id number] Date of Birth: 1966-06-12   Medicare Observation Status Notification Given:  Yes    Bethena Roys, RN 05/24/2017, 5:12 PM

## 2017-05-24 NOTE — Progress Notes (Signed)
ANTICOAGULATION CONSULT NOTE - Follow Up Consult  Pharmacy Consult for heparin Indication: chest pain/ACS w/ recent NSTEMI  Labs:  Recent Labs  05/22/17 0308 05/23/17 1114 05/23/17 1212 05/23/17 1610 05/24/17 0210  HGB 12.4 13.8  --   --  12.6  HCT 39.1 43.1  --   --  39.8  PLT 212 169  --   --  202  HEPARINUNFRC  --   --   --   --  0.97*  CREATININE 2.06*  --  1.70*  --   --   CKTOTAL  --   --   --  54  --   CKMB  --   --   --  1.2  --   TROPONINI  --   --   --  0.74*  --     Assessment: 51yo female above goal on heparin with initial dosing for CP.  Goal of Therapy:  Heparin level 0.3-0.7 units/ml   Plan:  Will decrease heparin gtt by 2-3 units/kg/hr to 900 units/hr and check level in [place], PharmD, BCPS  05/24/2017,3:42 AM

## 2017-05-24 NOTE — Plan of Care (Signed)
Problem: Safety: Goal: Ability to remain free from injury will improve Outcome: Completed/Met Date Met: 05/24/17 Patient uses call light appropriately and can call RN/NT with white phone by looking on the board, all personal belongings within reach and patient follows the safety instructions appropriately.

## 2017-05-24 NOTE — Progress Notes (Signed)
Patient ID: Summer Cook, female   DOB: [birth year], 51 y.o.   MRN: [id number]  UA suggestive of UTI.  Send urine culture and will start ceftriaxone for now pending culture data.  Loralie Champagne 05/24/2017

## 2017-05-25 ENCOUNTER — Encounter (HOSPITAL_COMMUNITY): Payer: Self-pay | Admitting: General Practice

## 2017-05-25 DIAGNOSIS — I2510 Atherosclerotic heart disease of native coronary artery without angina pectoris: Secondary | ICD-10-CM

## 2017-05-25 DIAGNOSIS — E7800 Pure hypercholesterolemia, unspecified: Secondary | ICD-10-CM

## 2017-05-25 DIAGNOSIS — I214 Non-ST elevation (NSTEMI) myocardial infarction: Secondary | ICD-10-CM | POA: Diagnosis not present

## 2017-05-25 DIAGNOSIS — R079 Chest pain, unspecified: Secondary | ICD-10-CM | POA: Diagnosis not present

## 2017-05-25 DIAGNOSIS — N390 Urinary tract infection, site not specified: Secondary | ICD-10-CM

## 2017-05-25 LAB — CBC
HEMATOCRIT: 36.3 % (ref 36.0–46.0)
Hemoglobin: 11.8 g/dL — ABNORMAL LOW (ref 12.0–15.0)
MCH: 28.9 pg (ref 26.0–34.0)
MCHC: 32.5 g/dL (ref 30.0–36.0)
MCV: 89 fL (ref 78.0–100.0)
PLATELETS: 198 10*3/uL (ref 150–400)
RBC: 4.08 MIL/uL (ref 3.87–5.11)
RDW: 14.7 % (ref 11.5–15.5)
WBC: 8.3 10*3/uL (ref 4.0–10.5)

## 2017-05-25 MED ORDER — CIPROFLOXACIN HCL 500 MG PO TABS
500.0000 mg | ORAL_TABLET | Freq: Two times a day (BID) | ORAL | 0 refills | Status: DC
Start: 2017-05-25 — End: 2017-06-02

## 2017-05-25 MED ORDER — ISOSORBIDE DINITRATE 10 MG PO TABS: 10.0000 mg | ORAL_TABLET | Freq: Two times a day (BID) | ORAL | 11 refills | Status: DC

## 2017-05-25 MED ORDER — NITROGLYCERIN 0.4 MG SL SUBL: 0.4000 mg | SUBLINGUAL_TABLET | SUBLINGUAL | 12 refills | Status: DC | PRN

## 2017-05-25 NOTE — Progress Notes (Signed)
Progress Note  Patient Name: Summer Cook Date of Encounter: 05/25/2017  Primary Cardiologist: Dr. Domenic Polite  Subjective   Issues with sleeping received Lorrin Mais last night Started on ceftriaxone by Dr Aundra Dubin For UTI last night culture pending    Inpatient Medications    Scheduled Meds: . aspirin EC  81 mg Oral Daily  . atorvastatin  80 mg Oral q1800  . clopidogrel  75 mg Oral Daily  . escitalopram  20 mg Oral Daily  . isosorbide dinitrate  10 mg Oral BID  . metoprolol tartrate  12.5 mg Oral BID   Continuous Infusions: . sodium chloride 100 mL/hr at 05/25/17 0634  . cefTRIAXone (ROCEPHIN)  IV Stopped (05/25/17 0121)   PRN Meds: acetaminophen, albuterol, ALPRAZolam, ipratropium-albuterol, nitroGLYCERIN, ondansetron (ZOFRAN) IV, traMADol, zolpidem   Vital Signs    Vitals:   05/24/17 1500 05/24/17 2057 05/24/17 2330 05/25/17 0536  BP:  123/78 124/78 116/74  Pulse: 66 60 83 (!) 56  Resp: 20 20  19   Temp: 98.2 F (36.8 C) 98.2 F (36.8 C)  98.3 F (36.8 C)  TempSrc: Oral     SpO2: 98% 99%  98%  Weight:    188 lb 14.4 oz (85.7 kg)  Height:        Intake/Output Summary (Last 24 hours) at 05/25/17 0820 Last data filed at 05/25/17 0600  Gross per 24 hour  Intake             2432 ml  Output             2300 ml  Net              132 ml   Filed Weights   05/23/17 1817 05/24/17 0406 05/25/17 0536  Weight: 186 lb (84.4 kg) 187 lb 4.8 oz (85 kg) 188 lb 14.4 oz (85.7 kg)    Telemetry    Sinus bradycardia (rates in the mid 50s) occasional PVC - Personally Reviewed   Physical Exam   GEN: Well nourished, well developed HEENT: normal  Neck: no JVD, carotid bruits, or masses Cardiac: RRR. no murmurs, rubs, or gallops,no edema. Intact distal pulses bilaterally.  Respiratory: clear to auscultation bilaterally, normal work of breathing GI: soft, nontender, nondistended, + BS MS: no deformity or atrophy  Skin: warm and dry, no rash Neuro: Alert and Oriented x 3,  Strength and sensation are intact Psych:   Full affect  Labs    Chemistry  Recent Labs Lab 05/20/17 0433 05/20/17 1902 05/22/17 0308 05/23/17 1212 05/23/17 1610  NA 137  --  141 136  --   K 4.3  --  4.9 5.5*  --   CL 104  --  108 105  --   CO2 24  --  27 24  --   GLUCOSE 90  --  124* 79  --   BUN 29*  --  33* 29*  --   CREATININE 1.58* 1.78* 2.06* 1.70*  --   CALCIUM 8.8*  --  8.8* 9.2  --   PROT 6.7  --  5.8* 6.8 6.9  ALBUMIN 3.5  --  3.1* 3.6 3.4*  AST 31  --  26 27 29   ALT 16  --  15 19 18   ALKPHOS 83  --  86 92 89  BILITOT 0.6  --  0.4 0.6 0.6  GFRNONAA 37* 32* 27* 34*  --   GFRAA 43* 37* 31* 39*  --   ANIONGAP 9  --  6  7  --      Hematology  Recent Labs Lab 05/23/17 1114 05/24/17 0210 05/25/17 0144  WBC 6.3 9.8 8.3  RBC 4.83 4.41 4.08  HGB 13.8 12.6 11.8*  HCT 43.1 39.8 36.3  MCV 89.2 90.2 89.0  MCH 28.6 28.6 28.9  MCHC 32.0 31.7 32.5  RDW 14.4 14.6 14.7  PLT 169 202 198    Cardiac Enzymes  Recent Labs Lab 05/19/17 2216 05/20/17 0433 05/20/17 1040 05/23/17 1610  TROPONINI 0.55* 1.69* 2.28* 0.74*     Recent Labs Lab 05/19/17 1609 05/23/17 1122  TROPIPOC 0.00 0.81*     BNP  Recent Labs Lab 05/23/17 1212  BNP 43.5     DDimer   Recent Labs Lab 05/19/17 1616  DDIMER 0.55*     Radiology    Dg Chest 2 View  Result Date: 05/23/2017 CLINICAL DATA:  Shortness of breath EXAM: CHEST  2 VIEW COMPARISON:  CTA chest dated 05/19/2017 FINDINGS: Lungs are clear.  No pleural effusion or pneumothorax. The heart is normal in size. Mild superior endplate changes at F81. Cholecystectomy clips. IMPRESSION: Normal chest radiographs. Electronically Signed   By: Julian Hy M.D.   On: 05/23/2017 10:53    Cardiac Studies   ECHO: 05/21/2017 - Left ventricle: The cavity size was normal. Systolic function was normal. Wall motion was normal; there were no regional wall motion abnormalities. Doppler parameters are consistent  with abnormal left ventricular relaxation (grade 1 diastolic dysfunction). Doppler parameters are consistent with elevated ventricular end-diastolic filling pressure. - Aortic valve: There was no regurgitation. - Mitral valve: There was mild regurgitation. - Right ventricle: The cavity size was normal. Wall thickness was normal. Systolic function was normal. - Right atrium: The atrium was normal in size. - Tricuspid valve: There was no regurgitation. - Pulmonic valve: There was no regurgitation. - Pulmonary arteries: Systolic pressure could not be accurately estimated. - Inferior vena cava: The vessel was normal in size. - Pericardium, extracardiac: There was no pericardial effusion.  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  The left ventricular systolic function is normal.  LV end diastolic pressure is normal.  The left ventricular ejection fraction is 55-65% by visual estimate.Very small focal distal inferior HK. 1. 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 Otherwise normal coronary arteries. 3. Normal LV function with very small focal area of distal inferior hypokinesis. Plan: medical therapy. Diagnostic Diagram       Patient Profile     Summer Cook is a 51 y.o. female with a history of CVA, HTN, depression, CKD stage III and ITP s/p splenectomy, hx PE 2013 when plt were 11,000, was on coumadin but no longer anticoagulated and subsequent CTs negative, who was hospitalized with a NSTEMI 08/22-08/25/2018.   Assessment & Plan     1. CAD: total distal RCA EF normal cotinue ASA/Plavix and beta blocker  2. Chol:  Continue high dose statin  3. UTI:  Would send home on cipro 500 bid for 5 days f/u primary [facility] in [place]  D/c home cardiology f/u [facility] in [place]

## 2017-05-25 NOTE — Progress Notes (Signed)
Results look like aUTI with many bacteria and leukocytes growing and urine is cloudy with some blood, sent a text page, awaiting further orders. Pt resting at this time since she was given an Ambien, at her request, VSS, will continue to monitor,

## 2017-05-25 NOTE — Progress Notes (Signed)
Text paged Dr Aundra Dubin with patient's c/o burning and pressure with urination and cloudy foul smelling urine in the collection hat. We had changed out the hat for a new clean one and collected a specimen, will send off and text page with results.

## 2017-05-25 NOTE — Discharge Summary (Addendum)
Discharge Summary    Patient ID: Summer Cook,  MRN: [id number], DOB/AGE: Aug 14, 1966 51 y.o.  Admit date: 05/23/2017 Discharge date: 05/25/2017  Primary Care Provider: [employer] Primary Cardiologist: Dr. Domenic Polite   Discharge Diagnoses    Principal Problem:   CAD (coronary artery disease): total distal RCA EF normal (05/20/17) Active Problems:   UTI (urinary tract infection)   High cholesterol   Allergies Allergies  Allergen Reactions  . Brintellix [Vortioxetine] Itching  . Yellow Jacket Venom Swelling  . Adhesive [Tape] Rash    Please use paper tape  . Zoloft [Sertraline Hcl] Diarrhea     History of Present Illness     Summer Cook is a 51 year old female with a PMH of CVA, HTN, depression, CKD stage III and ITP s/p splenectomy, hx of PE 2013 when plt where 11,000, was on coumadin but no longer anticoagulated and subsequent CTs negative. She was hospitalized  with an NSTEMI 05/19/17 - 05/22/2017. Cardiac catheterization was performed on 8/23 and revealed 100% distal PDA, otherwise nl cors >> med mgt. She was discharged on ASA, BB, PLavix, and Statin. No ACE/ARB due to CKD. She was doing well at discharge.  She returned to the ER the day after discharge on the 26th because she was wakened by chest pain that was consistent with her NSTEMI. The pressure was severe, middle of the chest with some associated sharp pains as well. She also had left lateral pain and tingling. Her pain worse with deep inspiration and was not positional. She did not try any medications for her symptoms before coming to the ER. She was provided nitro paste and Morphine in the ED which did not help her pain.  Hospital Course     Consultants: None  She was admitted by our service for observation. She did not have any ischemic changes to her EKG. Her chest pain was felt to be pleuritic in nature and her troponin had trended down from the previous admission 2.28 >> 0.8.Her pain was potentially  from residual ischemia despite the area of infarct being very small from her NSTEMI (distal occlusion of PDA-- otherwise normal coronary arteries). She was started on IV Nitroglycerin and Heparin and observed overnight.  She did not have any more chest pain throughout her stay but developed a headache from the Nitroglycerin. The nitro and heparin were discontinued on 8/27  and she was started on Isordil 10 mg BID.  She was again observed for a second night. This morning she reports having no chest pain, feeling well and ready for discharge. Her headache has resolved and she has been up ambulating without difficulty. She was diagnosed with a UTI  During the admission (urine culture is still pending) and given  a dose of IV Rocephin. Plan is to discharge her on Ciprofloxacin 500 mg BID for 5 days. No further medication changes were made.  She is not on a ACE/ARB because of CKD.  The patient has had an uncomplicated hospital course and is recovering well. The radial atheter site is stable She has been seen by Dr. Johnsie Cancel today and deemed ready for discharge home. All follow-up appointments have been scheduled.  A work excuse note was provided as well. Discharge medications are listed below.  _____________  Discharge Vitals Blood pressure 116/74, pulse (!) 56, temperature 98.3 F (36.8 C), resp. rate 19, height 5\' 3"  (1.6 m), weight 188 lb 14.4 oz (85.7 kg), last menstrual period 08/13/2011, SpO2 98 %.  Filed Weights   05/23/17 1817 05/24/17 0406 05/25/17 0536  Weight: 186 lb (84.4 kg) 187 lb 4.8 oz (85 kg) 188 lb 14.4 oz (85.7 kg)    Labs & Radiologic Studies    CBC  Recent Labs  05/24/17 0210 05/25/17 0144  WBC 9.8 8.3  HGB 12.6 11.8*  HCT 39.8 36.3  MCV 90.2 89.0  PLT 202 093   Basic Metabolic Panel  Recent Labs  05/23/17 1212  NA 136  K 5.5*  CL 105  CO2 24  GLUCOSE 79  BUN 29*  CREATININE 1.70*  CALCIUM 9.2   Liver Function Tests  Recent Labs  05/23/17 1212  05/23/17 1610  AST 27 29  ALT 19 18  ALKPHOS 92 89  BILITOT 0.6 0.6  PROT 6.8 6.9  ALBUMIN 3.6 3.4*   No results for input(s): LIPASE, AMYLASE in the last 72 hours. Cardiac Enzymes  Recent Labs  05/23/17 1610  CKTOTAL 54  CKMB 1.2  TROPONINI 0.74*    Dg Chest 2 View  Result Date: 05/23/2017 CLINICAL DATA:  Shortness of breath EXAM: CHEST  2 VIEW COMPARISON:  CTA chest dated 05/19/2017 FINDINGS: Lungs are clear.  No pleural effusion or pneumothorax. The heart is normal in size. Mild superior endplate changes at A35. Cholecystectomy clips. IMPRESSION: Normal chest radiographs. Electronically Signed   By: Julian Hy M.D.   On: 05/23/2017 10:53   Dg Chest 2 View  Result Date: 05/19/2017 CLINICAL DATA:  Chest pain for 2 hours. EXAM: CHEST  2 VIEW COMPARISON:  01/14/2017 FINDINGS: The cardiac silhouette, mediastinal and hilar contours are within normal limits and stable. The lungs are clear. No pleural effusion. The bony thorax is intact. Remote compression deformities are noted involving T12 and T10. IMPRESSION: No acute cardiopulmonary findings. Electronically Signed   By: Marijo Sanes M.D.   On: 05/19/2017 17:31   Ct Angio Chest Pe W And/or Wo Contrast  Result Date: 05/19/2017 CLINICAL DATA:  Chest pain.  History of pulmonary embolism EXAM: CT ANGIOGRAPHY CHEST WITH CONTRAST TECHNIQUE: Multidetector CT imaging of the chest was performed using the standard protocol during bolus administration of intravenous contrast. Multiplanar CT image reconstructions and MIPs were obtained to evaluate the vascular anatomy. CONTRAST:  75 mL Isovue COMPARISON:  None. FINDINGS: Cardiovascular: No filling defects within the pulmonary arteries to suggest acute pulmonary embolism. No acute findings of the aorta or great vessels. No pericardial fluid. No acute findings of the aorta great vessels.  No pericardial fluid. Mediastinum/Nodes: No axillary supraclavicular adenopathy. No mediastinal or hilar  adenopathy. No pericardial fluid esophagus normal Lungs/Pleura: No pulmonary infarction. Mild basilar atelectasis. No pneumonia. No pneumothorax. Upper Abdomen: Limited view of the liver, kidneys, pancreas are unremarkable. Normal adrenal glands. Musculoskeletal: No aggressive osseous lesion. Review of the MIP images confirms the above findings. IMPRESSION: 1. No evidence acute pulmonary embolism. 2. No acute pulmonary parenchymal findings. Electronically Signed   By: Suzy Bouchard M.D.   On: 05/19/2017 18:21     Diagnostic Studies/Procedures    ECHO: 05/21/2017 - Left ventricle: The cavity size was normal. Systolic function was normal. Wall motion was normal; there were no regional wall motion abnormalities. Doppler parameters are consistent with abnormal left ventricular relaxation (grade 1 diastolic dysfunction). Doppler parameters are consistent with elevated ventricular end-diastolic filling pressure. - Aortic valve: There was no regurgitation. - Mitral valve: There was mild regurgitation. - Right ventricle: The cavity size was normal. Wall thickness was normal. Systolic function was normal. - Right atrium:  The atrium was normal in size. - Tricuspid valve: There was no regurgitation. - Pulmonic valve: There was no regurgitation. - Pulmonary arteries: Systolic pressure could not be accurately estimated. - Inferior vena cava: The vessel was normal in size. - Pericardium, extracardiac: There was no pericardial effusion.  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  The left ventricular systolic function is normal.  LV end diastolic pressure is normal.  The left ventricular ejection fraction is 55-65% by visual estimate.Very small focal distal inferior HK. 1. 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 Otherwise normal coronary arteries. 3. Normal LV function with very small focal area of distal inferior hypokinesis. Plan: medical  therapy. Diagnostic Diagram      _____________    Disposition   Pt is being discharged home today in good condition.  Follow-up Plans & Appointments    Follow-up Information    Imogene Burn, PA-C. Go on 06/09/2017.   Specialty:  Cardiology Why:  Your appointment is at 11:30am, please arrive 10-15 minutes early. Contact information: [place] [address] [phone]        [employer], MD Follow up.   Specialty:  Internal Medicine Why:  Call to schedule a follow-up appointment to be seen within the next 1-2 weeks Contact information: [address] [place] [address] [phone]          Discharge Instructions    Diet - low sodium heart healthy    Complete by:  As directed    Increase activity slowly    Complete by:  As directed    May shower / Bathe    Complete by:  As directed       Discharge Medications   Allergies as of 05/25/2017      Reactions   Brintellix [vortioxetine] Itching   Yellow Jacket Venom Swelling   Adhesive [tape] Rash   Please use paper tape   Zoloft [sertraline Hcl] Diarrhea      Medication List    TAKE these medications   aspirin 81 MG EC tablet Take 1 tablet (81 mg total) by mouth daily.   atorvastatin 80 MG tablet Commonly known as:  LIPITOR Take 1 tablet (80 mg total) by mouth daily at 6 PM.   ciprofloxacin 500 MG tablet Commonly known as:  CIPRO Take 1 tablet (500 mg total) by mouth 2 (two) times daily.   clopidogrel 75 MG tablet Commonly known as:  PLAVIX Take 1 tablet (75 mg total) by mouth daily.   escitalopram 20 MG tablet Commonly known as:  LEXAPRO Take 20 mg by mouth daily.   ipratropium-albuterol 0.5-2.5 (3) MG/3ML Soln Commonly known as:  DUONEB Take 3 mLs by nebulization 3 (three) times daily as needed (shorntess of breath/wheezing). What changed:  reasons to take this   isosorbide dinitrate 10 MG tablet Commonly known as:  ISORDIL Take 1 tablet (10 mg total) by  mouth 2 (two) times daily.   metoprolol tartrate 25 MG tablet Commonly known as:  LOPRESSOR Take 0.5 tablets (12.5 mg total) by mouth 2 (two) times daily.   nitroGLYCERIN 0.4 MG SL tablet Commonly known as:  NITROSTAT Place 1 tablet (0.4 mg total) under the tongue every 5 (five) minutes x 3 doses as needed for chest pain.   PROAIR RESPICLICK 287 (90 Base) MCG/ACT Aepb Generic drug:  Albuterol Sulfate Inhale 2 puffs into the lungs every 4 (four) hours as needed (Shortness of breath).  Discharge Care Instructions        Start     Ordered   05/25/17 0000  isosorbide dinitrate (ISORDIL) 10 MG tablet  2 times daily    Question:  Supervising Provider  Answer:  Sueanne Margarita   05/25/17 0905   05/25/17 0000  nitroGLYCERIN (NITROSTAT) 0.4 MG SL tablet  Every 5 min x3 PRN    Question:  Supervising Provider  Answer:  Sueanne Margarita   05/25/17 0905   05/25/17 0000  ciprofloxacin (CIPRO) 500 MG tablet  2 times daily    Question:  Supervising Provider  Answer:  Sueanne Margarita   05/25/17 0905   05/25/17 0000  Increase activity slowly     05/25/17 0905   05/25/17 0000  May shower / Bathe     05/25/17 0905   05/25/17 0000  Diet - low sodium heart healthy     05/25/17 0905       Aspirin prescribed at discharge?  Yes High Intensity Statin Prescribed? (Lipitor 40-80mg  or Crestor 20-40mg ): Yes Beta Blocker Prescribed? Yes For EF 45% or less, Was ACEI/ARB Prescribed? No: CKD ADP Receptor Inhibitor Prescribed? (i.e. Plavix etc.-Includes Medically Managed Patients): Yes For EF <45%, Aldosterone Inhibitor Prescribed? No: EF normal Was EF assessed during THIS hospitalization? No:  Was Cardiac Rehab II ordered? (Included Medically managed Patients): No:   Outstanding Labs/Studies    Duration of Discharge Encounter   Greater than 30 minutes including physician time.  Kristopher Glee PA-C 05/25/2017, 9:52 AM

## 2017-05-25 NOTE — Plan of Care (Signed)
Problem: Pain Managment: Goal: General experience of comfort will improve Outcome: Progressing Patient given a Xanax for some anxiety and H/A pain, will continue to monitor, also is c/o burning, pressure and frequency with urination, will continue to monitor. Patient is denying chest pain at this time.

## 2017-05-26 LAB — URINE CULTURE

## 2017-05-28 ENCOUNTER — Inpatient Hospital Stay (HOSPITAL_COMMUNITY)
Admission: EM | Admit: 2017-05-28 | Discharge: 2017-06-02 | DRG: 281 | Disposition: A | Discharge status: Home / Self Care | Payer: Medicare HMO | Attending: Interventional Cardiology | Admitting: Interventional Cardiology

## 2017-05-28 ENCOUNTER — Encounter (HOSPITAL_COMMUNITY): Payer: Self-pay

## 2017-05-28 ENCOUNTER — Other Ambulatory Visit: Payer: Self-pay

## 2017-05-28 DIAGNOSIS — I222 Subsequent non-ST elevation (NSTEMI) myocardial infarction: Principal | ICD-10-CM | POA: Diagnosis present

## 2017-05-28 DIAGNOSIS — N179 Acute kidney failure, unspecified: Secondary | ICD-10-CM | POA: Diagnosis not present

## 2017-05-28 DIAGNOSIS — N390 Urinary tract infection, site not specified: Secondary | ICD-10-CM | POA: Diagnosis present

## 2017-05-28 DIAGNOSIS — I10 Essential (primary) hypertension: Secondary | ICD-10-CM | POA: Diagnosis not present

## 2017-05-28 DIAGNOSIS — E119 Type 2 diabetes mellitus without complications: Secondary | ICD-10-CM | POA: Diagnosis not present

## 2017-05-28 DIAGNOSIS — Z91030 Bee allergy status: Secondary | ICD-10-CM

## 2017-05-28 DIAGNOSIS — I214 Non-ST elevation (NSTEMI) myocardial infarction: Secondary | ICD-10-CM | POA: Diagnosis present

## 2017-05-28 DIAGNOSIS — I25110 Atherosclerotic heart disease of native coronary artery with unstable angina pectoris: Secondary | ICD-10-CM | POA: Diagnosis not present

## 2017-05-28 DIAGNOSIS — I129 Hypertensive chronic kidney disease with stage 1 through stage 4 chronic kidney disease, or unspecified chronic kidney disease: Secondary | ICD-10-CM | POA: Diagnosis present

## 2017-05-28 DIAGNOSIS — I340 Nonrheumatic mitral (valve) insufficiency: Secondary | ICD-10-CM | POA: Diagnosis present

## 2017-05-28 DIAGNOSIS — I2510 Atherosclerotic heart disease of native coronary artery without angina pectoris: Secondary | ICD-10-CM | POA: Diagnosis not present

## 2017-05-28 DIAGNOSIS — Z86711 Personal history of pulmonary embolism: Secondary | ICD-10-CM

## 2017-05-28 DIAGNOSIS — I25758 Atherosclerosis of native coronary artery of transplanted heart with other forms of angina pectoris: Secondary | ICD-10-CM | POA: Diagnosis not present

## 2017-05-28 DIAGNOSIS — Z6830 Body mass index (BMI) 30.0-30.9, adult: Secondary | ICD-10-CM | POA: Diagnosis not present

## 2017-05-28 DIAGNOSIS — Z7982 Long term (current) use of aspirin: Secondary | ICD-10-CM

## 2017-05-28 DIAGNOSIS — I952 Hypotension due to drugs: Secondary | ICD-10-CM | POA: Diagnosis not present

## 2017-05-28 DIAGNOSIS — Z7902 Long term (current) use of antithrombotics/antiplatelets: Secondary | ICD-10-CM

## 2017-05-28 DIAGNOSIS — I454 Nonspecific intraventricular block: Secondary | ICD-10-CM | POA: Diagnosis present

## 2017-05-28 DIAGNOSIS — N183 Chronic kidney disease, stage 3 (moderate): Secondary | ICD-10-CM | POA: Diagnosis not present

## 2017-05-28 DIAGNOSIS — Z8673 Personal history of transient ischemic attack (TIA), and cerebral infarction without residual deficits: Secondary | ICD-10-CM

## 2017-05-28 DIAGNOSIS — I200 Unstable angina: Secondary | ICD-10-CM | POA: Diagnosis present

## 2017-05-28 DIAGNOSIS — R079 Chest pain, unspecified: Secondary | ICD-10-CM | POA: Diagnosis not present

## 2017-05-28 DIAGNOSIS — Z79899 Other long term (current) drug therapy: Secondary | ICD-10-CM

## 2017-05-28 DIAGNOSIS — Z888 Allergy status to other drugs, medicaments and biological substances status: Secondary | ICD-10-CM

## 2017-05-28 DIAGNOSIS — Z9081 Acquired absence of spleen: Secondary | ICD-10-CM

## 2017-05-28 DIAGNOSIS — I959 Hypotension, unspecified: Secondary | ICD-10-CM | POA: Diagnosis present

## 2017-05-28 DIAGNOSIS — Z9109 Other allergy status, other than to drugs and biological substances: Secondary | ICD-10-CM

## 2017-05-28 DIAGNOSIS — Z8489 Family history of other specified conditions: Secondary | ICD-10-CM

## 2017-05-28 DIAGNOSIS — D693 Immune thrombocytopenic purpura: Secondary | ICD-10-CM | POA: Diagnosis present

## 2017-05-28 NOTE — ED Triage Notes (Signed)
Pt states she has had recurrent chest pain throughout the day, states she just got home from [place] on Tuesday for same.  Chest pain returned Friday morning and was relieved with nitro.  Tonight, the pain returned and was not relieved with 3 nitro.

## 2017-05-29 ENCOUNTER — Emergency Department (HOSPITAL_COMMUNITY): Payer: Medicare HMO

## 2017-05-29 ENCOUNTER — Other Ambulatory Visit: Payer: Self-pay

## 2017-05-29 DIAGNOSIS — I214 Non-ST elevation (NSTEMI) myocardial infarction: Secondary | ICD-10-CM

## 2017-05-29 DIAGNOSIS — Z9109 Other allergy status, other than to drugs and biological substances: Secondary | ICD-10-CM | POA: Diagnosis not present

## 2017-05-29 DIAGNOSIS — Z79899 Other long term (current) drug therapy: Secondary | ICD-10-CM | POA: Diagnosis not present

## 2017-05-29 DIAGNOSIS — I200 Unstable angina: Secondary | ICD-10-CM | POA: Diagnosis present

## 2017-05-29 DIAGNOSIS — I10 Essential (primary) hypertension: Secondary | ICD-10-CM | POA: Diagnosis not present

## 2017-05-29 DIAGNOSIS — Z888 Allergy status to other drugs, medicaments and biological substances status: Secondary | ICD-10-CM | POA: Diagnosis not present

## 2017-05-29 DIAGNOSIS — I129 Hypertensive chronic kidney disease with stage 1 through stage 4 chronic kidney disease, or unspecified chronic kidney disease: Secondary | ICD-10-CM | POA: Diagnosis not present

## 2017-05-29 DIAGNOSIS — R079 Chest pain, unspecified: Secondary | ICD-10-CM | POA: Diagnosis not present

## 2017-05-29 DIAGNOSIS — I209 Angina pectoris, unspecified: Secondary | ICD-10-CM | POA: Diagnosis not present

## 2017-05-29 DIAGNOSIS — Z91030 Bee allergy status: Secondary | ICD-10-CM | POA: Diagnosis not present

## 2017-05-29 DIAGNOSIS — N183 Chronic kidney disease, stage 3 (moderate): Secondary | ICD-10-CM | POA: Diagnosis not present

## 2017-05-29 DIAGNOSIS — I959 Hypotension, unspecified: Secondary | ICD-10-CM | POA: Diagnosis not present

## 2017-05-29 DIAGNOSIS — I222 Subsequent non-ST elevation (NSTEMI) myocardial infarction: Secondary | ICD-10-CM | POA: Diagnosis not present

## 2017-05-29 DIAGNOSIS — N179 Acute kidney failure, unspecified: Secondary | ICD-10-CM | POA: Diagnosis not present

## 2017-05-29 DIAGNOSIS — N390 Urinary tract infection, site not specified: Secondary | ICD-10-CM | POA: Diagnosis not present

## 2017-05-29 DIAGNOSIS — Z7902 Long term (current) use of antithrombotics/antiplatelets: Secondary | ICD-10-CM | POA: Diagnosis not present

## 2017-05-29 DIAGNOSIS — I340 Nonrheumatic mitral (valve) insufficiency: Secondary | ICD-10-CM | POA: Diagnosis present

## 2017-05-29 DIAGNOSIS — N189 Chronic kidney disease, unspecified: Secondary | ICD-10-CM | POA: Diagnosis not present

## 2017-05-29 DIAGNOSIS — N289 Disorder of kidney and ureter, unspecified: Secondary | ICD-10-CM | POA: Diagnosis not present

## 2017-05-29 DIAGNOSIS — I25110 Atherosclerotic heart disease of native coronary artery with unstable angina pectoris: Secondary | ICD-10-CM | POA: Diagnosis not present

## 2017-05-29 DIAGNOSIS — Z9081 Acquired absence of spleen: Secondary | ICD-10-CM | POA: Diagnosis not present

## 2017-05-29 DIAGNOSIS — Z8673 Personal history of transient ischemic attack (TIA), and cerebral infarction without residual deficits: Secondary | ICD-10-CM | POA: Diagnosis not present

## 2017-05-29 DIAGNOSIS — Z86711 Personal history of pulmonary embolism: Secondary | ICD-10-CM | POA: Diagnosis not present

## 2017-05-29 DIAGNOSIS — Z8489 Family history of other specified conditions: Secondary | ICD-10-CM | POA: Diagnosis not present

## 2017-05-29 DIAGNOSIS — I454 Nonspecific intraventricular block: Secondary | ICD-10-CM | POA: Diagnosis not present

## 2017-05-29 DIAGNOSIS — D693 Immune thrombocytopenic purpura: Secondary | ICD-10-CM | POA: Diagnosis not present

## 2017-05-29 DIAGNOSIS — I25758 Atherosclerosis of native coronary artery of transplanted heart with other forms of angina pectoris: Secondary | ICD-10-CM | POA: Diagnosis not present

## 2017-05-29 DIAGNOSIS — Z7982 Long term (current) use of aspirin: Secondary | ICD-10-CM | POA: Diagnosis not present

## 2017-05-29 LAB — CBC WITH DIFFERENTIAL/PLATELET
BASOS PCT: 1 %
Basophils Absolute: 0.1 10*3/uL (ref 0.0–0.1)
EOS ABS: 0.2 10*3/uL (ref 0.0–0.7)
EOS PCT: 2 %
HCT: 39.8 % (ref 36.0–46.0)
Hemoglobin: 13 g/dL (ref 12.0–15.0)
Lymphocytes Relative: 45 %
Lymphs Abs: 4.5 10*3/uL — ABNORMAL HIGH (ref 0.7–4.0)
MCH: 29.1 pg (ref 26.0–34.0)
MCHC: 32.7 g/dL (ref 30.0–36.0)
MCV: 89.2 fL (ref 78.0–100.0)
MONO ABS: 0.9 10*3/uL (ref 0.1–1.0)
Monocytes Relative: 9 %
Neutro Abs: 4.3 10*3/uL (ref 1.7–7.7)
Neutrophils Relative %: 43 %
Platelets: 197 10*3/uL (ref 150–400)
RBC: 4.46 MIL/uL (ref 3.87–5.11)
RDW: 14 % (ref 11.5–15.5)
WBC: 9.9 10*3/uL (ref 4.0–10.5)

## 2017-05-29 LAB — PROTIME-INR
INR: 1.04
PROTHROMBIN TIME: 13.5 s (ref 11.4–15.2)

## 2017-05-29 LAB — CBC
HEMATOCRIT: 36.3 % (ref 36.0–46.0)
HEMOGLOBIN: 12 g/dL (ref 12.0–15.0)
MCH: 29 pg (ref 26.0–34.0)
MCHC: 33.1 g/dL (ref 30.0–36.0)
MCV: 87.7 fL (ref 78.0–100.0)
Platelets: 194 10*3/uL (ref 150–400)
RBC: 4.14 MIL/uL (ref 3.87–5.11)
RDW: 14.4 % (ref 11.5–15.5)
WBC: 11.2 10*3/uL — ABNORMAL HIGH (ref 4.0–10.5)

## 2017-05-29 LAB — COMPREHENSIVE METABOLIC PANEL
ALBUMIN: 3.9 g/dL (ref 3.5–5.0)
ALT: 23 U/L (ref 14–54)
ANION GAP: 12 (ref 5–15)
AST: 29 U/L (ref 15–41)
Alkaline Phosphatase: 94 U/L (ref 38–126)
BUN: 40 mg/dL — ABNORMAL HIGH (ref 6–20)
CHLORIDE: 105 mmol/L (ref 101–111)
CO2: 22 mmol/L (ref 22–32)
Calcium: 8.8 mg/dL — ABNORMAL LOW (ref 8.9–10.3)
Creatinine, Ser: 2.39 mg/dL — ABNORMAL HIGH (ref 0.44–1.00)
GFR calc Af Amer: 26 mL/min — ABNORMAL LOW (ref >60)
GFR calc non Af Amer: 22 mL/min — ABNORMAL LOW (ref >60)
GLUCOSE: 129 mg/dL — AB (ref 65–99)
POTASSIUM: 3.9 mmol/L (ref 3.5–5.1)
SODIUM: 139 mmol/L (ref 135–145)
Total Bilirubin: 0.7 mg/dL (ref 0.3–1.2)
Total Protein: 7.2 g/dL (ref 6.5–8.1)

## 2017-05-29 LAB — TROPONIN I
Troponin I: <0.03 ng/mL (ref <0.03)
Troponin I: <0.03 ng/mL (ref <0.03)

## 2017-05-29 LAB — APTT: aPTT: 30 seconds (ref 24–36)

## 2017-05-29 LAB — HEPARIN LEVEL (UNFRACTIONATED): Heparin Unfractionated: 0.56 IU/mL (ref 0.30–0.70)

## 2017-05-29 MED ORDER — HEPARIN SODIUM (PORCINE) 5000 UNIT/ML IJ SOLN
4000.0000 [IU] | Freq: Once | INTRAMUSCULAR | Status: AC
  Administered 2017-05-29: 4000 [IU] via INTRAVENOUS
  Filled 2017-05-29: qty 1

## 2017-05-29 MED ORDER — SODIUM CHLORIDE 0.9 % IV BOLUS (SEPSIS)
1000.0000 mL | Freq: Once | INTRAVENOUS | Status: AC
  Administered 2017-05-29: 1000 mL via INTRAVENOUS

## 2017-05-29 MED ORDER — ONDANSETRON HCL 4 MG/2ML IJ SOLN
4.0000 mg | Freq: Four times a day (QID) | INTRAMUSCULAR | Status: DC | PRN
  Administered 2017-05-30 – 2017-06-02 (×3): 4 mg via INTRAVENOUS
  Filled 2017-05-29 (×3): qty 2

## 2017-05-29 MED ORDER — NITROGLYCERIN IN D5W 200-5 MCG/ML-% IV SOLN: 0.0000 ug/min | INTRAVENOUS | Status: DC

## 2017-05-29 MED ORDER — ISOSORBIDE DINITRATE 10 MG PO TABS
10.0000 mg | ORAL_TABLET | Freq: Two times a day (BID) | ORAL | Status: DC
  Administered 2017-05-29 (×2): 10 mg via ORAL
  Filled 2017-05-29 (×4): qty 1

## 2017-05-29 MED ORDER — METOPROLOL TARTRATE 12.5 MG HALF TABLET
12.5000 mg | ORAL_TABLET | Freq: Two times a day (BID) | ORAL | Status: DC
  Administered 2017-05-29 (×2): 12.5 mg via ORAL
  Filled 2017-05-29 (×2): qty 1

## 2017-05-29 MED ORDER — HEPARIN (PORCINE) IN NACL 100-0.45 UNIT/ML-% IJ SOLN
1000.0000 [IU]/h | INTRAMUSCULAR | Status: DC
  Administered 2017-05-29 – 2017-05-31 (×2): 900 [IU]/h via INTRAVENOUS
  Administered 2017-06-01: 1000 [IU]/h via INTRAVENOUS
  Filled 2017-05-29 (×3): qty 250

## 2017-05-29 MED ORDER — ESCITALOPRAM OXALATE 20 MG PO TABS
20.0000 mg | ORAL_TABLET | Freq: Every day | ORAL | Status: DC
  Administered 2017-05-29 – 2017-06-02 (×5): 20 mg via ORAL
  Filled 2017-05-29 (×5): qty 1

## 2017-05-29 MED ORDER — TRAMADOL HCL 50 MG PO TABS
25.0000 mg | ORAL_TABLET | Freq: Once | ORAL | Status: AC
  Administered 2017-05-29: 25 mg via ORAL
  Filled 2017-05-29 (×2): qty 1

## 2017-05-29 MED ORDER — MORPHINE SULFATE (PF) 4 MG/ML IV SOLN
4.0000 mg | Freq: Once | INTRAVENOUS | Status: AC
  Administered 2017-05-29: 4 mg via INTRAVENOUS
  Filled 2017-05-29: qty 1

## 2017-05-29 MED ORDER — ASPIRIN 81 MG PO CHEW
324.0000 mg | CHEWABLE_TABLET | Freq: Once | ORAL | Status: AC
  Administered 2017-05-29: 324 mg via ORAL
  Filled 2017-05-29: qty 4

## 2017-05-29 MED ORDER — ATORVASTATIN CALCIUM 80 MG PO TABS
80.0000 mg | ORAL_TABLET | Freq: Every day | ORAL | Status: DC
  Administered 2017-05-29 – 2017-06-01 (×4): 80 mg via ORAL
  Filled 2017-05-29 (×4): qty 1

## 2017-05-29 MED ORDER — NITROGLYCERIN IN D5W 200-5 MCG/ML-% IV SOLN
5.0000 ug/min | Freq: Once | INTRAVENOUS | Status: AC
  Administered 2017-05-29: 5 ug/min via INTRAVENOUS
  Filled 2017-05-29: qty 250

## 2017-05-29 MED ORDER — IPRATROPIUM-ALBUTEROL 0.5-2.5 (3) MG/3ML IN SOLN: 3.0000 mL | Freq: Three times a day (TID) | RESPIRATORY_TRACT | Status: DC | PRN

## 2017-05-29 MED ORDER — HEPARIN (PORCINE) IN NACL 100-0.45 UNIT/ML-% IJ SOLN
16.0000 [IU]/kg/h | Freq: Once | INTRAMUSCULAR | Status: DC
  Filled 2017-05-29: qty 250

## 2017-05-29 MED ORDER — CIPROFLOXACIN HCL 500 MG PO TABS
500.0000 mg | ORAL_TABLET | Freq: Two times a day (BID) | ORAL | Status: AC
  Administered 2017-05-29 – 2017-05-30 (×4): 500 mg via ORAL
  Filled 2017-05-29 (×4): qty 1

## 2017-05-29 MED ORDER — ALBUTEROL SULFATE (2.5 MG/3ML) 0.083% IN NEBU: 2.5000 mg | INHALATION_SOLUTION | RESPIRATORY_TRACT | Status: DC | PRN

## 2017-05-29 MED ORDER — ASPIRIN EC 81 MG PO TBEC
81.0000 mg | DELAYED_RELEASE_TABLET | Freq: Every day | ORAL | Status: DC
  Administered 2017-05-29 – 2017-06-02 (×5): 81 mg via ORAL
  Filled 2017-05-29 (×5): qty 1

## 2017-05-29 MED ORDER — ACETAMINOPHEN 325 MG PO TABS
650.0000 mg | ORAL_TABLET | ORAL | Status: DC | PRN
  Administered 2017-05-29 – 2017-06-02 (×5): 650 mg via ORAL
  Filled 2017-05-29 (×5): qty 2

## 2017-05-29 MED ORDER — CLOPIDOGREL BISULFATE 75 MG PO TABS
75.0000 mg | ORAL_TABLET | Freq: Every day | ORAL | Status: DC
  Administered 2017-05-29 – 2017-06-02 (×5): 75 mg via ORAL
  Filled 2017-05-29 (×5): qty 1

## 2017-05-29 NOTE — ED Notes (Signed)
Report given to [facility] on [address]

## 2017-05-29 NOTE — ED Provider Notes (Signed)
[facility] Provider Note   CSN: [id number] Arrival date & time: 05/28/17  2344   Time seen 12:05 AM  History   Chief Complaint Chief Complaint  Patient presents with  . Chest Pain    HPI LINZY Cook is a 51 y.o. female.  HPI  patient states she was recently diagnosed with coronary artery disease. She was admitted on August 22 with 8 and STEMI and again on August 26 for a another in STEMI. She had a cardiac catheterization done on August 23 which showed a RPDA lesion 100% stenosis, normal left ventricular systolic function, normal left ventricular end-diastolic pressure, and estimated left ventricular ejection fraction of 55-65%. The plan was to do medical therapy. She states since she's been home she's been more tired than usual and having a lot of lack of energy. Tonight her daughter was working and they were having a late birthday party for her two-year-old granddaughter. She states she drove to [facility] about 9:30 PM and started getting central chest pain. She states when they arrived about 945 the pain started getting more intense. She states she has central chest discomfort described as pressure and tightness and then a "stinging" under her left breast. The pain has made her feel short of breath, diaphoretic, with nausea but no vomiting. Her family reports she was very pale. She has not had any aspirin tonight, but did take nitroglycerin sublingual 3 without change in her pain. She states deep breathing makes the pain worse, nothing makes it feel better. She states the pain tonight is exactly like the pain she had the last 2 episodes in the past 10 days.  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 She also has a history of hypertension, stroke in March 2017 and PE several years ago.  PCP Redmond School, MD Cardiology Dr Domenic Polite  Past Medical History:  Diagnosis Date  . Allergy   . Asthma   . CAD (coronary artery  disease), native coronary artery    a. cath 05/20/17 - 100% distal PDA --> medical mangement (no intervention done)  . Candida esophagitis ([place])   . Chest pain at rest 04/2017  . Depression   . Essential hypertension, benign   . GERD (gastroesophageal reflux disease)   . Gout   . Hemorrhoids   . 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  Last was Prednisone '40mg'$ ; 2 wk in 10/2012.  She also was on Prednisone bridged with Cellcept briefly but stopped due to lack of insurance.   . Pulmonary embolism ([place]) 04/2012  . Renal insufficiency   . Serrated adenoma of colon   . Steroid-induced diabetes ([place])   . Stroke ([place])   . Thrush, oral 05/29/2011    Patient Active Problem List   Diagnosis Date Noted  . CAD (coronary artery disease): total distal RCA EF normal (05/20/17) 05/25/2017  . UTI (urinary tract infection) 05/25/2017  . High cholesterol 05/25/2017  . Chest pain with moderate risk for cardiac etiology 05/23/2017  . Mixed hyperlipidemia   . NSTEMI (non-ST elevated myocardial infarction) ([place]) 05/20/2017  . Diabetes mellitus type 2, uncomplicated ([place]) 40/98/1191  . History of stroke   . Chronic back pain 12/23/2015  . Asthma, chronic 02/16/2015  . Anxiety state 02/16/2015  . CKD (chronic kidney disease) stage 3, GFR 30-59 ml/min 04/28/2014  . Nausea with vomiting 01/25/2013  . Abnormal LFTs  01/25/2013  . Orthostatic hypotension 01/25/2013  . Rectal bleeding 12/26/2012  . History of pulmonary embolism 05/03/2012  . Hemorrhoids 06/04/2011  . 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 Immunosuppressed status ([place]) 06/04/2011  . Depression 06/04/2011  . Obesity 06/04/2011  . Hypertension 03/18/2011  . Idiopathic thrombocytopenic purpura ([place]) 03/18/2011    Past Surgical History:  Procedure Laterality Date  . BONE MARROW BIOPSY    . CARPAL TUNNEL RELEASE    . CARPAL TUNNEL RELEASE  Left 05/20/2016   Procedure: CARPAL TUNNEL RELEASE;  Surgeon: Carole Civil, MD;  Location: [facility];  Service: Orthopedics;  Laterality: Left;  . CARPAL TUNNEL RELEASE Right 07/16/2016   Procedure: RIGHT CARPAL TUNNEL RELEASE;  Surgeon: Carole Civil, MD;  Location: [facility];  Service: Orthopedics;  Laterality: Right;  . CATARACT EXTRACTION    . CHOLECYSTECTOMY  2008  . COLONOSCOPY WITH ESOPHAGOGASTRODUODENOSCOPY (EGD) N/A 01/04/2013   Dr. Gala Romney: esophageal plaques with +KOH, hh. Gastric antrum abnormal, bx reactive gastropathy. Anal canal hemorrhoids, colonic tics, normal TI, single polyp (sessile serrated tubular adenoma). Next TCS 12/2017  . LEFT HEART CATH AND CORONARY ANGIOGRAPHY N/A 05/20/2017   Procedure: LEFT HEART CATH AND CORONARY ANGIOGRAPHY;  Surgeon: Martinique, Peter M, MD;  Location: [facility];  Service: Cardiovascular;  Laterality: N/A;  . 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 TEE WITHOUT CARDIOVERSION N/A 01/03/2016   Procedure: TRANSESOPHAGEAL ECHOCARDIOGRAM (TEE) WITH PROPOFOL;  Surgeon: Satira Sark, MD;  Location: [facility];  Service: Cardiovascular;  Laterality: N/A;    OB History    No data available       Home Medications    Prior to Admission medications   Medication Sig Start Date End Date Taking? Authorizing Provider  aspirin EC 81 MG EC tablet Take 1 tablet (81 mg total) by mouth daily. 05/23/17   Eileen Stanford, PA-C  atorvastatin (LIPITOR) 80 MG tablet Take 1 tablet (80 mg total) by mouth daily at 6 PM. 05/22/17   Eileen Stanford, PA-C  ciprofloxacin (CIPRO) 500 MG tablet Take 1 tablet (500 mg total) by mouth 2 (two) times daily. 05/25/17 05/30/17  Delos Haring, PA-C  clopidogrel (PLAVIX) 75 MG tablet Take 1 tablet (75 mg total) by mouth daily. 05/23/17   Eileen Stanford, PA-C  escitalopram (LEXAPRO) 20 MG tablet Take 20 mg by mouth daily.  05/03/17   [provider]  ipratropium-albuterol (DUONEB) 0.5-2.5 (3) MG/3ML SOLN Take 3 mLs  by nebulization 3 (three) times daily as needed (shorntess of breath/wheezing). Patient taking differently: Take 3 mLs by nebulization 3 (three) times daily as needed (shortness of breath/wheezing).  02/18/15   Kathie Dike, MD  isosorbide dinitrate (ISORDIL) 10 MG tablet Take 1 tablet (10 mg total) by mouth 2 (two) times daily. 05/25/17   Del[place] Haring, PA-C  metoprolol tartrate (LOPRESSOR) 25 MG tablet Take 0.5 tablets (12.5 mg total) by mouth 2 (two) times daily. 05/22/17   Eileen Stanford, PA-C  nitroGLYCERIN (NITROSTAT) 0.4 MG SL tablet Place 1 tablet (0.4 mg total) under the tongue every 5 (five) minutes x 3 doses as needed for chest pain. 05/25/17   [place] Haring, PA-C  PROAIR RESPICLICK 086 (90 BASE) MCG/ACT AEPB Inhale 2 puffs into the lungs every 4 (four) hours as needed (Shortness of breath).  12/27/14   [provider]    Family History Family History  Problem Relation Age of Onset  . Cervical cancer Mother   . Lung cancer Father   . Colon polyps  Father        ???  . Pneumonia Brother   . Colon cancer Paternal Grandmother 88  . AAA (abdominal aortic aneurysm) Paternal Grandmother   . Breast cancer Paternal Grandmother   . Colon cancer Paternal Grandfather 61  . Barrett's esophagus Paternal Aunt   . Stomach cancer Neg Hx   . Pancreatic cancer Neg Hx     Social History Social History  Substance Use Topics  . Smoking status: Former Smoker    Years: 0.00    Types: Cigarettes    Quit date: 02/14/2009  . Smokeless tobacco: Never Used  . Alcohol use No     Allergies   Brintellix [vortioxetine]; Yellow jacket venom; Adhesive [tape]; and Zoloft [sertraline hcl]   Review of Systems Review of Systems  All other systems reviewed and are negative.    Physical Exam Updated Vital Signs BP 95/73 (BP Location: Right Arm)   Pulse 77   Temp 97.8 F (36.6 C) (Oral)   Resp 15   Ht '5\' 5"'$  (1.651 m)   Wt 82.6 kg (182 lb)   LMP 08/13/2011   SpO2 97%   BMI  30.29 kg/m   Vital signs normal except borderline blood pressure most likely from taking the nitroglycerin prior to arrival to the ED   Physical Exam  Constitutional: She is oriented to person, place, and time. She appears well-developed and well-nourished.  Non-toxic appearance. She does not appear ill. No distress.  HENT:  Head: Normocephalic and atraumatic.  Right Ear: External ear normal.  Left Ear: External ear normal.  Nose: Nose normal. No mucosal edema or rhinorrhea.  Mouth/Throat: Oropharynx is clear and moist and mucous membranes are normal. No dental abscesses or uvula swelling.  Eyes: Pupils are equal, round, and reactive to light. Conjunctivae and EOM are normal.  Neck: Normal range of motion and full passive range of motion without pain. Neck supple.  Cardiovascular: Normal rate, regular rhythm and normal heart sounds.  Exam reveals no gallop and no friction rub.   No murmur heard. Pulmonary/Chest: Effort normal and breath sounds normal. No respiratory distress. She has no wheezes. She has no rhonchi. She has no rales. She exhibits no tenderness and no crepitus.    Area of chest pain noted.  Abdominal: Soft. Normal appearance and bowel sounds are normal. She exhibits no distension. There is no tenderness. There is no rebound and no guarding.  Musculoskeletal: Normal range of motion. She exhibits no edema or tenderness.  Moves all extremities well.   Neurological: She is alert and oriented to person, place, and time. She has normal strength. No cranial nerve deficit.  Skin: Skin is warm, dry and intact. No rash noted. No erythema. No pallor.  Psychiatric: She has a normal mood and affect. Her speech is normal and behavior is normal. Her mood appears not anxious.  Nursing note and vitals reviewed.    ED Treatments / Results  Labs (all labs ordered are listed, but only abnormal results are displayed) Results for orders placed or performed during the hospital encounter of  05/28/17  Troponin I  Result Value Ref Range   Troponin I <0.03 <0.03 ng/mL  Comprehensive metabolic panel  Result Value Ref Range   Sodium 139 135 - 145 mmol/L   Potassium 3.9 3.5 - 5.1 mmol/L   Chloride 105 101 - 111 mmol/L   CO2 22 22 - 32 mmol/L   Glucose, Bld 129 (H) 65 - 99 mg/dL   BUN 40 (H) 6 -  20 mg/dL   Creatinine, Ser 2.39 (H) 0.44 - 1.00 mg/dL   Calcium 8.8 (L) 8.9 - 10.3 mg/dL   Total Protein 7.2 6.5 - 8.1 g/dL   Albumin 3.9 3.5 - 5.0 g/dL   AST 29 15 - 41 U/L   ALT 23 14 - 54 U/L   Alkaline Phosphatase 94 38 - 126 U/L   Total Bilirubin 0.7 0.3 - 1.2 mg/dL   GFR calc non Af Amer 22 (L) >60 mL/min   GFR calc Af Amer 26 (L) >60 mL/min   Anion gap 12 5 - 15  CBC with Differential  Result Value Ref Range   WBC 9.9 4.0 - 10.5 K/uL   RBC 4.46 3.87 - 5.11 MIL/uL   Hemoglobin 13.0 12.0 - 15.0 g/dL   HCT 39.8 36.0 - 46.0 %   MCV 89.2 78.0 - 100.0 fL   MCH 29.1 26.0 - 34.0 pg   MCHC 32.7 30.0 - 36.0 g/dL   RDW 14.0 11.5 - 15.5 %   Platelets 197 150 - 400 K/uL   Neutrophils Relative % 43 %   Neutro Abs 4.3 1.7 - 7.7 K/uL   Lymphocytes Relative 45 %   Lymphs Abs 4.5 (H) 0.7 - 4.0 K/uL   Monocytes Relative 9 %   Monocytes Absolute 0.9 0.1 - 1.0 K/uL   Eosinophils Relative 2 %   Eosinophils Absolute 0.2 0.0 - 0.7 K/uL   Basophils Relative 1 %   Basophils Absolute 0.1 0.0 - 0.1 K/uL  Protime-INR  Result Value Ref Range   Prothrombin Time 13.5 11.4 - 15.2 seconds   INR 1.04   APTT  Result Value Ref Range   aPTT 30 24 - 36 seconds  Troponin I  Result Value Ref Range   Troponin I <0.03 <0.03 ng/mL   Laboratory interpretation all normal except Renal insufficiency, hyperglycemia, negative delta troponin    EKG #1  EKG Interpretation  Date/Time:  Friday May 28 2017 23:52:07 EDT Ventricular Rate:  69 PR Interval:    QRS Duration: 95 QT Interval:  555 QTC Calculation: 595 R Axis:   -4 Text Interpretation:  Sinus rhythm Anterior infarct, age indeterminate  Prolonged QT interval Since last tracing 23 May 2017 T wave inversion now evident in Anteroseptal leads Confirmed by Rolland Porter [id number]) on 05/29/2017 12:01:35 AM      #2 (central chest pain gone)  EKG Interpretation  Date/Time:  Saturday May 29 2017 03:08:53 EDT Ventricular Rate:  71 PR Interval:    QRS Duration: 101 QT Interval:  450 QTC Calculation: 490 R Axis:   -24 Text Interpretation:  Sinus rhythm Borderline left axis deviation Anterior infarct, age indeterminate T-wave inversion in Anteroseptal leads No significant change since last tracing HOURS EARLIER Confirmed by Rolland Porter [phone]) on 05/29/2017 3:15:48 AM         Radiology Dg Chest Port 1 View  Result Date: 05/29/2017 CLINICAL DATA:  Chest pain EXAM: PORTABLE CHEST 1 VIEW COMPARISON:  05/23/2017 FINDINGS: The heart size and mediastinal contours are within normal limits. Both lungs are clear. The visualized skeletal structures are unremarkable. IMPRESSION: No active disease. Electronically Signed   By: Donavan Foil M.D.   On: 05/29/2017 00:43     May 20, 2017 [facility] ODLE  CARDIAC CATHETERIZATION  Order# [id number]  Reading physician: Martinique, Peter M, MD Ordering physician: Martinique, Peter M, MD Study date: 05/20/17  Physicians   Panel Physicians Referring Physician Case Authorizing Physician  Martinique, Peter M, MD (Primary)  Procedures   LEFT HEART CATH AND CORONARY ANGIOGRAPHY  Conclusion     RPDA lesion, 100 %stenosed.  The left ventricular systolic function is normal.  LV end diastolic pressure is normal.  The left ventricular ejection fraction is 55-65% by visual estimate.   1. 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 Otherwise normal coronary arteries. 3. Normal LV function with very small focal area of distal inferior hypokinesis.  Plan: medical therapy.        May 21, 2017 ------------------------------------------------------------------- Study  Conclusions  - Left ventricle: The cavity size was normal. Systolic function was   normal. Wall motion was normal; there were no regional wall   motion abnormalities. Doppler parameters are consistent with   abnormal left ventricular relaxation (grade 1 diastolic   dysfunction). Doppler parameters are consistent with elevated   ventricular end-diastolic filling pressure. - Aortic valve: There was no regurgitation. - Mitral valve: There was mild regurgitation. - Right ventricle: The cavity size was normal. Wall thickness was   normal. Systolic function was normal. - Right atrium: The atrium was normal in size. - Tricuspid valve: There was no regurgitation. - Pulmonic valve: There was no regurgitation. - Pulmonary arteries: Systolic pressure could not be accurately   estimated. - Inferior vena cava: The vessel was normal in size. - Pericardium, extracardiac: There was no pericardial effusion.   Procedures Procedures (including critical care time)  CRITICAL CARE Performed by: Martrice Apt L Neftaly Swiss Total critical care time: 40 minutes Critical care time was exclusive of separately billable procedures and treating other patients. Critical care was necessary to treat or prevent imminent or life-threatening deterioration. Critical care was time spent personally by me on the following activities: development of treatment plan with patient and/or surrogate as well as nursing, discussions with consultants, evaluation of patient's response to treatment, examination of patient, obtaining history from patient or surrogate, ordering and performing treatments and interventions, ordering and review of laboratory studies, ordering and review of radiographic studies, pulse oximetry and re-evaluation of patient's condition.   Medications Ordered in ED Medications  heparin ADULT infusion 100 units/mL (25000 units/219m sodium chloride 0.45%) (900 Units/hr Intravenous Transfusing/Transfer 05/29/17 0338)  aspirin  chewable tablet 324 mg (324 mg Oral Given 05/29/17 0030)  nitroGLYCERIN 50 mg in dextrose 5 % 250 mL (0.2 mg/mL) infusion (5 mcg/min Intravenous Transfusing/Transfer 05/29/17 0337)  heparin injection 4,000 Units (4,000 Units Intravenous Bolus 05/29/17 0039)  sodium chloride 0.9 % bolus 1,000 mL (0 mLs Intravenous Stopped 05/29/17 0200)  morphine 4 MG/ML injection 4 mg (4 mg Intravenous Given 05/29/17 0233)     Initial Impression / Assessment and Plan / ED Course  I have reviewed the triage vital signs and the nursing notes.  Pertinent labs & imaging results that were available during my care of the patient were reviewed by me and considered in my medical decision making (see chart for details).    Patient was started on aspirin and started on nitroglycerin infusion and heparin which helped with her last 2 STEMI episodes in the past 10 days. Her blood pressure was borderline most likely from the recent nitroglycerin she took so she was given a bolus of normal saline. We discussed she has new changes on her EKG specifically she now has flipped T waves in her anteroseptal leads, and that [facility] her initial troponin resulted.  1:49 AM Dr. LDelsa Sale cardiology fellow, [facility]  [facility] for Dr. Domenic Polite attending.  Recheck at 3 AM, patient's blood pressure has improved to 887 systolic. Patient is on a nitroglycerin drip and has been able to receive her morphine. She states her chest pain that was central is gone. She's having some minor lateral left-sided chest pain that she describes as stinging that comes and goes.  We talked to bed control and they state there were no telemetry beds available.  3:01 AM I spoke to Dr. Delsa Sale, cardiologist again. [facility] verified there is no telemetry or stepdown beds. There are ICU beds however he does not want to use an ICU bed. We discussed me talking to the charge nurse at [facility] can ED  to see if they would accept her there and he could evaluate her in the ED until a bed was available for admission.  3:07 AM I spoke to [name], Camera operator at [facility] can ED, she has accepted the patient to [facility] ED.  3:08 AM as I was going to speak to the ED physician at [facility] called back and states that bed control states there is a telemetry bed available now. [facility] has an ambulance that's available and is on their way to transport patient.  4 AM [facility] is here to transport patient to [facility].    Final Clinical Impressions(s) / ED Diagnoses   Final diagnoses:  NSTEMI (non-ST elevated myocardial infarction) ([place])  Hypotension due to drugs    Plan transfer to [facility]  Rolland Porter, MD, [place] Small, Daleen Bo, MD 05/29/17 [phone]

## 2017-05-29 NOTE — Progress Notes (Signed)
ANTICOAGULATION CONSULT NOTE - Preliminary  Pharmacy Consult for heparin Indication: chest pain/ACS  Allergies  Allergen Reactions  . Brintellix [Vortioxetine] Itching  . Yellow Jacket Venom Swelling  . Adhesive [Tape] Rash    Please use paper tape  . Zoloft [Sertraline Hcl] Diarrhea    Patient Measurements: Height: 5\' 5"  (165.1 cm) Weight: 182 lb (82.6 kg) IBW/kg (Calculated) : 57 HEPARIN DW (KG): 74.6   Vital Signs: Temp: 97.8 F (36.6 C) (08/31 2354) Temp Source: Oral (08/31 2354) BP: 97/78 (09/01 0000) Pulse Rate: 69 (09/01 0000)  Labs: No results for input(s): HGB, HCT, PLT, APTT, LABPROT, INR, HEPARINUNFRC, CREATININE, CKTOTAL, CKMB, TROPONINI in the last 72 hours. Estimated Creatinine Clearance: 41.5 mL/min (A) (by C-G formula based on SCr of 1.7 mg/dL (H)).  Medical History: Past Medical History:  Diagnosis Date  . Allergy   . Asthma   . CAD (coronary artery disease), native coronary artery    a. cath 05/20/17 - 100% distal PDA --> medical mangement (no intervention done)  . Candida esophagitis ([place])   . Chest pain at rest 04/2017  . Depression   . Essential hypertension, benign   . GERD (gastroesophageal reflux disease)   . Gout   . Hemorrhoids   . 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  Last was Prednisone 40mg ; 2 wk in 10/2012.  She also was on Prednisone bridged with Cellcept briefly but stopped due to lack of insurance.   . Pulmonary embolism ([place]) 04/2012  . Renal insufficiency   . Serrated adenoma of colon   . Steroid-induced diabetes ([place])   . Stroke ([place])   . Thrush, oral 05/29/2011    Medications:   (Not in a hospital admission) Scheduled:  . heparin  4,000 Units Intravenous Once   Infusions:  . heparin    . nitroGLYCERIN     PRN:  Anti-infectives    None      Assessment: Recurrent CP, just discharged from [facility] on 8/28  for same. NSTEMI 05/19/17-05/22/17, cath on 05/20/17- 100% occluded distal PDA. Returned to ED on 8/26 for CP- admitted for observation-discharged on 05/25/17. PMH: CVA, HTN, CKD stage III, ITP s/p splenectomy, PE in 2013.   Goal of Therapy:  Heparin level 0.3-0.7 units/ml   Plan:  Give 4000 units bolus x 1 Start heparin infusion at 900 units/hr Check anti-Xa level in 6 hours and daily while on heparin Continue to monitor H&H and platelets Preliminary review of pertinent patient information completed.  Forestine Na clinical pharmacist will complete review during morning rounds to assess the patient and finalize treatment regimen.  Nyra Capes, [facility] 05/29/2017,12:27 AM

## 2017-05-29 NOTE — Progress Notes (Signed)
[place] for heparin Indication: chest pain/ACS  Allergies  Allergen Reactions  . Brintellix [Vortioxetine] Itching  . Yellow Jacket Venom Swelling  . Adhesive [Tape] Rash    Please use paper tape  . Zoloft [Sertraline Hcl] Diarrhea    Patient Measurements: Height: 5\' 5"  (165.1 cm) Weight: 187 lb 1.6 oz (84.9 kg) IBW/kg (Calculated) : 57 HEPARIN DW (KG): 74.6   Vital Signs: Temp: 98.2 F (36.8 C) (09/01 0455) Temp Source: Oral (09/01 0455) BP: 103/72 (09/01 0944) Pulse Rate: 75 (09/01 0944)  Labs:  Recent Labs  05/29/17 0001 05/29/17 0326 05/29/17 0800  HGB 13.0  --  12.0  HCT 39.8  --  36.3  PLT 197  --  194  APTT 30  --   --   LABPROT 13.5  --   --   INR 1.04  --   --   HEPARINUNFRC  --   --  0.56  CREATININE 2.39*  --   --   TROPONINI <0.03 <0.03  --    Estimated Creatinine Clearance: 30 mL/min (A) (by C-G formula based on SCr of 2.39 mg/dL (H)).  Medical History: Past Medical History:  Diagnosis Date  . Allergy   . Asthma   . CAD (coronary artery disease), native coronary artery    a. cath 05/20/17 - 100% distal PDA --> medical mangement (no intervention done)  . Candida esophagitis ([place])   . Chest pain at rest 04/2017  . Depression   . Essential hypertension, benign   . GERD (gastroesophageal reflux disease)   . Gout   . Hemorrhoids   . 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  Last was Prednisone 40mg ; 2 wk in 10/2012.  She also was on Prednisone bridged with Cellcept briefly but stopped due to lack of insurance.   . Pulmonary embolism ([place]) 04/2012  . Renal insufficiency   . Serrated adenoma of colon   . Steroid-induced diabetes ([place])   . Stroke ([place])   . Thrush, oral 05/29/2011   Scheduled:  . aspirin EC  81 mg Oral Daily  . atorvastatin  80 mg Oral q1800  . ciprofloxacin  500 mg Oral BID  .  clopidogrel  75 mg Oral Daily  . escitalopram  20 mg Oral Daily  . isosorbide dinitrate  10 mg Oral BID  . metoprolol tartrate  12.5 mg Oral BID   Infusions:  . heparin 900 Units/hr (05/29/17 0131)  . nitroGLYCERIN     Assessment: 51 yo female w/ recurrent CP, just discharged from [facility] on 8/28 for NSTEMI 05/19/17-05/22/17, cath on 05/20/17- 100% occluded distal PDA. Returned to ED on 8/26 for CP- admitted for observation-discharged on 05/25/17. Pharmacy consulted for heparin due to recurrent CP.  -heparin level = 0.56    Goal of Therapy:  Heparin level 0.3-0.7 units/ml   Plan:  -No heparin changes needed -Daily heparin level and CBC  Hildred Laser, Pharm D 05/29/2017 10:23 AM

## 2017-05-29 NOTE — H&P (Addendum)
Cardiology Admission History and Physical:   Patient ID: Summer Cook; MRN: [id number]; DOB: [birth year]   Admission date: 05/28/2017  Primary Care Provider: Redmond School, MD Primary Cardiologist: Dr. Domenic Polite  Chief Complaint:  Chest pain  Patient Profile:   Summer Cook is a 51 y.o. female with a history of CVA, HTN, depression, CKD stage III and ITP s/p splenectomy, hx of PE 2013 when plt where 11,000, was on coumadin but no longer anticoagulated and subsequent CTs negative who presents with CP  History of Present Illness:   Summer Cook states that she woke up on Friday morning with a pressure like sensation in her chest.  She took 3 NTG SL tabs (5 min apart) and the pain eventually subsided.  At 9pm after dinner, the pain recurred.  She took 3 more NTG but didn't feel any improvement in symptoms.  She states she felt LH but did not have any presyncope.  She presented to AM ED where her CP continued requiring initiation of a NTG infusion.  After this was started and she received morphine, she said her pain subsided around 3 am.  [facility].  Summer Cook was recently discharged from the cardiology service on 8/28 after her second NSTEMI admission in the past week.  Initially she presented on 8/22 and cardiac cath showed a 100% occlusion of the dPDA with otherwise normal coronaries, so medical mangement was pursued (started on ASA, BB, plavix, statin).  She represented on 8/26 with recurrent CP and required nitropaste and morphine.  Her troponins were downtrending compared to her recent admission so her pain was felt to be from her residual ischemia from her initial NSTEMI.  She was started on isordil 10 BID and observed.  She states that her current symptoms are worse than this second presentation but better than her initial.  At the time of my evaluation, she is chest pain free with no SOB, orthopnea or PND.  She endorses a mild HA  since starting the NTG infusion.   Past Medical History:  Diagnosis Date  . Allergy   . Asthma   . CAD (coronary artery disease), native coronary artery    a. cath 05/20/17 - 100% distal PDA --> medical mangement (no intervention done)  . Candida esophagitis ([place])   . Chest pain at rest 04/2017  . Depression   . Essential hypertension, benign   . GERD (gastroesophageal reflux disease)   . Gout   . Hemorrhoids   . 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  Last was Prednisone 78m; 2 wk in 10/2012.  She also was on Prednisone bridged with Cellcept briefly but stopped due to lack of insurance.   . Pulmonary embolism ([place] 04/2012  . Renal insufficiency   . Serrated adenoma of colon   . Steroid-induced diabetes ([place]   . [place]   . Thrush, oral 05/29/2011    Past Surgical History:  Procedure Laterality Date  . BONE MARROW BIOPSY    . CARPAL TUNNEL RELEASE    . CARPAL TUNNEL RELEASE Left 05/20/2016   Procedure: CARPAL TUNNEL RELEASE;  Surgeon: SCarole Civil MD;  Location: [facility];  Service: Orthopedics;  Laterality: Left;  . CARPAL TUNNEL RELEASE Right 07/16/2016   Procedure: RIGHT CARPAL TUNNEL RELEASE;  Surgeon: SCarole Civil MD;  Location: [facility];  Service: Orthopedics;  Laterality: Right;  .  CATARACT EXTRACTION    . CHOLECYSTECTOMY  2008  . COLONOSCOPY WITH ESOPHAGOGASTRODUODENOSCOPY (EGD) N/A 01/04/2013   Dr. Gala Romney: esophageal plaques with +KOH, hh. Gastric antrum abnormal, bx reactive gastropathy. Anal canal hemorrhoids, colonic tics, normal TI, single polyp (sessile serrated tubular adenoma). Next TCS 12/2017  . LEFT HEART CATH AND CORONARY ANGIOGRAPHY N/A 05/20/2017   Procedure: LEFT HEART CATH AND CORONARY ANGIOGRAPHY;  Surgeon: Martinique, Peter M, MD;  Location: [facility];  Service: Cardiovascular;  Laterality: N/A;  . 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  TEE WITHOUT CARDIOVERSION N/A 01/03/2016   Procedure: TRANSESOPHAGEAL ECHOCARDIOGRAM (TEE) WITH PROPOFOL;  Surgeon: Satira Sark, MD;  Location: [facility];  Service: Cardiovascular;  Laterality: N/A;     Medications Prior to Admission: Prior to Admission medications   Medication Sig Start Date End Date Taking? Authorizing Provider  aspirin EC 81 MG EC tablet Take 1 tablet (81 mg total) by mouth daily. 05/23/17   Eileen Stanford, PA-C  atorvastatin (LIPITOR) 80 MG tablet Take 1 tablet (80 mg total) by mouth daily at 6 PM. 05/22/17   Eileen Stanford, PA-C  ciprofloxacin (CIPRO) 500 MG tablet Take 1 tablet (500 mg total) by mouth 2 (two) times daily. 05/25/17 05/30/17  Delos Haring, PA-C  clopidogrel (PLAVIX) 75 MG tablet Take 1 tablet (75 mg total) by mouth daily. 05/23/17   Eileen Stanford, PA-C  escitalopram (LEXAPRO) 20 MG tablet Take 20 mg by mouth daily.  05/03/17   [provider]  ipratropium-albuterol (DUONEB) 0.5-2.5 (3) MG/3ML SOLN Take 3 mLs by nebulization 3 (three) times daily as needed (shorntess of breath/wheezing). Patient taking differently: Take 3 mLs by nebulization 3 (three) times daily as needed (shortness of breath/wheezing).  02/18/15   Kathie Dike, MD  isosorbide dinitrate (ISORDIL) 10 MG tablet Take 1 tablet (10 mg total) by mouth 2 (two) times daily. 05/25/17   Delos Haring, PA-C  metoprolol tartrate (LOPRESSOR) 25 MG tablet Take 0.5 tablets (12.5 mg total) by mouth 2 (two) times daily. 05/22/17   Eileen Stanford, PA-C  nitroGLYCERIN (NITROSTAT) 0.4 MG SL tablet Place 1 tablet (0.4 mg total) under the tongue every 5 (five) minutes x 3 doses as needed for chest pain. 05/25/17   Delos Haring, PA-C  PROAIR RESPICLICK 342 (90 BASE) MCG/ACT AEPB Inhale 2 puffs into the lungs every 4 (four) hours as needed (Shortness of breath).  12/27/14   [provider]     Allergies:    Allergies  Allergen Reactions  . Brintellix [Vortioxetine]  Itching  . Yellow Jacket Venom Swelling  . Adhesive [Tape] Rash    Please use paper tape  . Zoloft [Sertraline Hcl] Diarrhea    Social History:   Social History   Social History  . Marital status: Divorced    Spouse name: N/A  . Number of children: 1  . Years of education: N/A   Occupational History  .  Unemployed    was a Educational psychologist; last worked 2012.    Social History Main Topics  . Smoking status: Former Smoker    Years: 0.00    Types: Cigarettes    Quit date: 02/14/2009  . Smokeless tobacco: Never Used  . Alcohol use No  . Drug use: No  . Sexual activity: No   Other Topics Concern  . Not on file   Social History Narrative   Lives with her daughter and aunt in a one story home.  Has 1 daughter.  On disability.  Did work as a Optometrist and bus Geophysicist/field seismologist.  Education: some college.     Family History:   The patient's family history includes AAA (abdominal aortic aneurysm) in her paternal grandmother; Barrett's esophagus in her paternal aunt; Breast cancer in her paternal grandmother; Cervical cancer in her mother; Colon cancer (age of onset: 44) in her paternal grandfather; Colon cancer (age of onset: 9) in her paternal grandmother; Colon polyps in her father; Lung cancer in her father; Pneumonia in her brother. There is no history of Stomach cancer or Pancreatic cancer.    ROS:  Please see the history of present illness.  No SOB, n/v, LH, orthopnea, PND, syncope All other ROS reviewed and negative.     Physical Exam/Data:   Vitals:   05/29/17 0320 05/29/17 0327 05/29/17 0328 05/29/17 0455  BP: 111/81 114/88 114/88 115/87  Pulse: 62 60 62   Resp: _0 (!) 21  Temp:   97.8 F (36.6 C) 98.2 F (36.8 C)  TempSrc:    Oral  SpO2: 99% 100% 100% 96%  Weight:    84.9 kg (187 lb 1.6 oz)  Height:        Intake/Output Summary (Last 24 hours) at 05/29/17 0553 Last data filed at 05/29/17 0200  Gross per 24 hour  Intake           1008.1 ml  Output                0  ml  Net           1008.1 ml   Filed Weights   05/28/17 2354 05/29/17 0455  Weight: 82.6 kg (182 lb) 84.9 kg (187 lb 1.6 oz)   Body mass index is 31.14 kg/m.  General:  Well nourished, well developed, in no acute distress, tired appearing HEENT: normal Neck: no JVD Endocrine:  No thryomegaly Vascular: No carotid bruits; FA pulses 2+ bilaterally without bruits  Cardiac:  normal S1, S2; RRR; no murmur  Lungs:  clear to auscultation bilaterally, no wheezing, rhonchi or rales  Abd: soft, nontender, no hepatomegaly  Ext: no edema Musculoskeletal:  No deformities, BUE and BLE strength normal and equal Skin: warm and dry  Neuro:  CNs 2-12 intact, no focal abnormalities noted Psych:  Normal affect    EKG:  The ECG that was done was personally reviewed and demonstrates normal sinus rhythm with old anterior Q waves and new TWI in V1-V3  Relevant CV Studies: TTE 05/21/17: - Left ventricle: The cavity size was normal. Systolic function was   normal. Wall motion was normal; there were no regional wall   motion abnormalities. Doppler parameters are consistent with   abnormal left ventricular relaxation (grade 1 diastolic   dysfunction). Doppler parameters are consistent with elevated   ventricular end-diastolic filling pressure. - Aortic valve: There was no regurgitation. - Mitral valve: There was mild regurgitation. - Right ventricle: The cavity size was normal. Wall thickness was   normal. Systolic function was normal. - Right atrium: The atrium was normal in size. - Tricuspid valve: There was no regurgitation. - Pulmonic valve: There was no regurgitation. - Pulmonary arteries: Systolic pressure could not be accurately   estimated. - Inferior vena cava: The vessel was normal in size. - Pericardium, extracardiac: There was no pericardial effusion.  LHC 05/20/17:  RPDA lesion, 100 %stenosed.  The left ventricular systolic function is normal.  LV end diastolic pressure is  normal.  The left ventricular ejection fraction is 55-65% by  visual estimate. 1. 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 Otherwise normal coronary arteries. 3. Normal LV function with very small focal area of distal inferior hypokinesis.   Laboratory Data:  Chemistry Recent Labs Lab 05/23/17 1212 05/29/17 0001  NA 136 139  K 5.5* 3.9  CL 105 105  CO2 24 22  GLUCOSE 79 129*  BUN 29* 40*  CREATININE 1.70* 2.39*  CALCIUM 9.2 8.8*  GFRNONAA 34* 22*  GFRAA 39* 26*  ANIONGAP 7 12     Recent Labs Lab 05/23/17 1610 05/29/17 0001  PROT 6.9 7.2  ALBUMIN 3.4* 3.9  AST 29 29  ALT 18 23  ALKPHOS 89 94  BILITOT 0.6 0.7   Hematology Recent Labs Lab 05/25/17 0144 05/29/17 0001  WBC 8.3 9.9  RBC 4.08 4.46  HGB 11.8* 13.0  HCT 36.3 39.8  MCV 89.0 89.2  MCH 28.9 29.1  MCHC 32.5 32.7  RDW 14.7 14.0  PLT 198 197   Cardiac Enzymes Recent Labs Lab 05/23/17 1610 05/29/17 0001 05/29/17 0326  TROPONINI 0.74* <0.03 <0.03    Recent Labs Lab 05/23/17 1122  TROPIPOC 0.81*    BNP Recent Labs Lab 05/23/17 1212  BNP 43.5    DDimer No results for input(s): DDIMER in the last 168 hours.  Radiology/Studies:  Dg Chest Port 1 View  Result Date: 05/29/2017 CLINICAL DATA:  Chest pain EXAM: PORTABLE CHEST 1 VIEW COMPARISON:  05/23/2017 FINDINGS: The heart size and mediastinal contours are within normal limits. Both lungs are clear. The visualized skeletal structures are unremarkable. IMPRESSION: No active disease. Electronically Signed   By: Donavan Foil M.D.   On: 05/29/2017 00:43    Assessment and Plan:   1. Recurrent angina: Ms. Sivertsen presents for the third time this week with recurrent anginal pain at rest despite medical therapies.  Her troponins have normalized and are now non-detectible which supports these symptoms being related to her prior MI rather than a new insult.  Nonetheless, her symptoms are concerning for angina and her ECG demonstrated new  TWI all suggesting she has ongoing ischemia.  Her symptoms have improved with a NTG infusion. Will discuss with day team merits of reconsidering PCI vs escalating anti-antingals such as imdur.  Will continue medical therapy at this time. 1. Continue ASA, plavix, statin, BB (hold ACEi/ARB due to CKD) 2. Will continue heparin gtt pending discussion with day team 3. Will continue nitroglycerin gtt pending discussion with day team 2. UTI: Pt had a UTI during her last hospital stay and was started on a 5 day course of ciprofloxacin.  She is not currently having dysuria.  Will continue abx through 9/2 1. Continue cipro through 9/2 to complete 5 day course 3. HTN: BP well controlled 1. Continue home medications 4. CKDIII: Cr currently 2.39 (up from normal range of 1.8-2.1) 1. Avoid nephrotoxins, renally dose medications  Severity of Illness: The appropriate patient status for this patient is OBSERVATION. Observation status is judged to be reasonable and necessary in order to provide the required intensity of service to ensure the patient's safety. The patient's presenting symptoms, physical exam findings, and initial radiographic and laboratory data in the context of their medical condition is felt to place them at decreased risk for further clinical deterioration. Furthermore, it is anticipated that the patient will be medically stable for discharge from the hospital within 2 midnights of admission. The following factors support the patient status of observation.   " The patient's presenting symptoms include chest  pain. " The physical exam findings include normal. " The initial radiographic and laboratory data are normal troponin, dynamic ECG.   Signed, Lujean Amel, MD  05/29/2017 5:53 AM     Addendum: Pt seen, examined, and above reviewed. I agree with Dr Anson Crofts assessment.  Pt is currently stable. Will plan medicine escalation over the weekend and consideration of RCA PCI early next week if  not improved.   No plans for urgent PCI over the weekend.  Thompson Grayer MD, [facility] 05/29/2017 11:49 AM

## 2017-05-29 NOTE — ED Notes (Signed)
Report given to [facility] with [facility]

## 2017-05-30 DIAGNOSIS — I200 Unstable angina: Secondary | ICD-10-CM

## 2017-05-30 DIAGNOSIS — I10 Essential (primary) hypertension: Secondary | ICD-10-CM

## 2017-05-30 DIAGNOSIS — N189 Chronic kidney disease, unspecified: Secondary | ICD-10-CM

## 2017-05-30 DIAGNOSIS — N289 Disorder of kidney and ureter, unspecified: Secondary | ICD-10-CM

## 2017-05-30 LAB — CBC
HCT: 37.2 % (ref 36.0–46.0)
Hemoglobin: 11.7 g/dL — ABNORMAL LOW (ref 12.0–15.0)
MCH: 27.9 pg (ref 26.0–34.0)
MCHC: 31.5 g/dL (ref 30.0–36.0)
MCV: 88.6 fL (ref 78.0–100.0)
PLATELETS: 184 10*3/uL (ref 150–400)
RBC: 4.2 MIL/uL (ref 3.87–5.11)
RDW: 14.3 % (ref 11.5–15.5)
WBC: 9.2 10*3/uL (ref 4.0–10.5)

## 2017-05-30 LAB — HEPARIN LEVEL (UNFRACTIONATED): HEPARIN UNFRACTIONATED: 0.69 [IU]/mL (ref 0.30–0.70)

## 2017-05-30 MED ORDER — METOPROLOL TARTRATE 25 MG PO TABS
25.0000 mg | ORAL_TABLET | Freq: Two times a day (BID) | ORAL | Status: DC
  Administered 2017-05-30 – 2017-06-02 (×5): 25 mg via ORAL
  Filled 2017-05-30 (×6): qty 1

## 2017-05-30 MED ORDER — AMLODIPINE BESYLATE 5 MG PO TABS
5.0000 mg | ORAL_TABLET | Freq: Every day | ORAL | Status: DC
  Administered 2017-05-30 – 2017-05-31 (×2): 5 mg via ORAL
  Filled 2017-05-30 (×2): qty 1

## 2017-05-30 MED ORDER — SODIUM CHLORIDE 0.9 % IV SOLN
INTRAVENOUS | Status: AC
  Administered 2017-05-31: 07:00:00 via INTRAVENOUS

## 2017-05-30 MED ORDER — ISOSORBIDE DINITRATE 20 MG PO TABS
20.0000 mg | ORAL_TABLET | Freq: Two times a day (BID) | ORAL | Status: DC
  Administered 2017-05-30 – 2017-06-02 (×7): 20 mg via ORAL
  Filled 2017-05-30 (×8): qty 1

## 2017-05-30 MED ORDER — METOPROLOL TARTRATE 25 MG PO TABS
25.0000 mg | ORAL_TABLET | Freq: Two times a day (BID) | ORAL | Status: DC
  Administered 2017-05-30: 25 mg via ORAL
  Filled 2017-05-30: qty 1

## 2017-05-30 MED ORDER — METOPROLOL TARTRATE 50 MG PO TABS: 50.0000 mg | ORAL_TABLET | Freq: Two times a day (BID) | ORAL | Status: DC

## 2017-05-30 NOTE — Progress Notes (Signed)
Progress Note  Patient Name: Summer Cook Date of Encounter: 05/30/2017  Primary Cardiologist: Dr Domenic Polite  Subjective   Doing well today, the patient denies exertional CP or SOB.  She does have some atypical chest pain at rest  No new concerns  Inpatient Medications    Scheduled Meds: . aspirin EC  81 mg Oral Daily  . atorvastatin  80 mg Oral q1800  . ciprofloxacin  500 mg Oral BID  . clopidogrel  75 mg Oral Daily  . escitalopram  20 mg Oral Daily  . isosorbide dinitrate  20 mg Oral BID  . metoprolol tartrate  25 mg Oral BID   Continuous Infusions: . sodium chloride    . heparin 900 Units/hr (05/29/17 0131)  . nitroGLYCERIN     PRN Meds: acetaminophen, albuterol, ipratropium-albuterol, ondansetron (ZOFRAN) IV   Vital Signs    Vitals:   05/29/17 0944 05/29/17 1525 05/29/17 2227 05/30/17 0527  BP: 103/72 103/79 115/83 (!) 135/97  Pulse: 75 63 (!) 58 62  Resp:  16 13 18   Temp:   98.1 F (36.7 C) 97.7 F (36.5 C)  TempSrc:  Oral Oral Oral  SpO2:  98% 100% 100%  Weight:      Height:        Intake/Output Summary (Last 24 hours) at 05/30/17 0955 Last data filed at 05/29/17 1500  Gross per 24 hour  Intake           141.58 ml  Output                0 ml  Net           141.58 ml   Filed Weights   05/28/17 2354 05/29/17 0455  Weight: 182 lb (82.6 kg) 187 lb 1.6 oz (84.9 kg)    Telemetry    Sinus rhythm - Personally Reviewed  Physical Exam   GEN- The patient is well appearing, alert and oriented x 3 today.   Head- normocephalic, atraumatic Eyes-  Sclera clear, conjunctiva pink Ears- hearing intact Oropharynx- clear Neck- supple, Lungs- Clear to ausculation bilaterally, normal work of breathing Heart- Regular rate and rhythm  GI- soft, NT, ND, + BS Extremities- no clubbing, cyanosis, or edema  MS- no significant deformity or atrophy Skin- no rash or lesion Psych- euthymic mood, full affect Neuro- strength and sensation are intact   Labs      Chemistry Recent Labs Lab 05/23/17 1212 05/23/17 1610 05/29/17 0001  NA 136  --  139  K 5.5*  --  3.9  CL 105  --  105  CO2 24  --  22  GLUCOSE 79  --  129*  BUN 29*  --  40*  CREATININE 1.70*  --  2.39*  CALCIUM 9.2  --  8.8*  PROT 6.8 6.9 7.2  ALBUMIN 3.6 3.4* 3.9  AST 27 29 29   ALT 19 18 23   ALKPHOS 92 89 94  BILITOT 0.6 0.6 0.7  GFRNONAA 34*  --  22*  GFRAA 39*  --  26*  ANIONGAP 7  --  12     Hematology Recent Labs Lab 05/29/17 0001 05/29/17 0800 05/30/17 0308  WBC 9.9 11.2* 9.2  RBC 4.46 4.14 4.20  HGB 13.0 12.0 11.7*  HCT 39.8 36.3 37.2  MCV 89.2 87.7 88.6  MCH 29.1 29.0 27.9  MCHC 32.7 33.1 31.5  RDW 14.0 14.4 14.3  PLT 197 194 184    Cardiac Enzymes Recent Labs Lab 05/23/17 1610 05/29/17  0001 05/29/17 0326  TROPONINI 0.74* <0.03 <0.03    Recent Labs Lab 05/23/17 1122  TROPIPOC 0.81*       Patient Profile     51 y.o. female with known CAD s/p recent cath, now admitted with recurrent chest pain with both typical and atypical features.  Assessment & Plan    1. Recurrent chest pain Both typical and atypical features Recent cath reviewed.  May benefit from reconsideration of PCI, however I worry about risks/ benefit ratio in the setting of acute on chronic renal failure. Will gently hydrate and have cardiology team reassess tomorrow Increase metoprolol and oral nitrates, could consider amlodipine if bp will allow.  ranexa would also be an option On ASA, plavix, and statin  2. Acute on chronic renal failure Not on ace inhibitor currently Gentle hydration today Reassess creatinine tomorrow  3. UTI To complete 5 day course of cipro today  4. HTN Stable As above  Complicated patient with high level of decision making required.   Thompson Grayer MD, [facility] 05/30/2017 9:55 AM

## 2017-05-30 NOTE — Progress Notes (Addendum)
Patent with pain/pressure in chest. Patient states it went from pressure to burning under left chest. VSS EKG obtained.  Dr. Rayann Heman paged through [facility] system will monitor patient. Cloer, Bettina Gavia RN    MD Addendum: Pt is currently sleeping.  ekg reveals no ischemic changes. Will add amlodipine for anginal benefit. Continue IV heparin  Given acute renal failure, not a candidate for urgent cath.  Hopefully with hydration, she may be a candidate for complex PCI later this week.  Cardiology will need to review cath films with interventional cardiology on Tuesday to see if PCI is feasible.  In the interim, will continue optimization of anginal medicines.  Thompson Grayer MD, [facility] 05/30/2017 4:54 PM

## 2017-05-30 NOTE — Progress Notes (Addendum)
Patient with chest pressure greater than has been this AM now 7/10 and states its going up. Pt had had nitro weaned off last PM. Nitro IV restarted as ordered. Currently infusing at 7mcg/ml VSS. Will monitor patient. Nguyet Mercer, Bettina Gavia rN

## 2017-05-30 NOTE — Progress Notes (Signed)
[place] for heparin Indication: chest pain/ACS  Allergies  Allergen Reactions  . Brintellix [Vortioxetine] Itching  . Yellow Jacket Venom Swelling  . Adhesive [Tape] Rash    Please use paper tape  . Zoloft [Sertraline Hcl] Diarrhea    Patient Measurements: Height: 5\' 5"  (165.1 cm) Weight: 187 lb 1.6 oz (84.9 kg) IBW/kg (Calculated) : 57 HEPARIN DW (KG): 74.6   Vital Signs: Temp: 97.7 F (36.5 C) (09/02 1438) Temp Source: Oral (09/02 1438) BP: 104/71 (09/02 1440) Pulse Rate: 54 (09/02 1440)  Labs:  Recent Labs  05/29/17 0001 05/29/17 0326 05/29/17 0800 05/30/17 0308 05/30/17 1303  HGB 13.0  --  12.0 11.7*  --   HCT 39.8  --  36.3 37.2  --   PLT 197  --  194 184  --   APTT 30  --   --   --   --   LABPROT 13.5  --   --   --   --   INR 1.04  --   --   --   --   HEPARINUNFRC  --   --  0.56  --  0.69  CREATININE 2.39*  --   --   --   --   TROPONINI <0.03 <0.03  --   --   --    Estimated Creatinine Clearance: 30 mL/min (A) (by C-G formula based on SCr of 2.39 mg/dL (H)).  Medical History: Past Medical History:  Diagnosis Date  . Allergy   . Asthma   . CAD (coronary artery disease), native coronary artery    a. cath 05/20/17 - 100% distal PDA --> medical mangement (no intervention done)  . Candida esophagitis ([place])   . Chest pain at rest 04/2017  . Depression   . Essential hypertension, benign   . GERD (gastroesophageal reflux disease)   . Gout   . Hemorrhoids   . 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  Last was Prednisone 40mg ; 2 wk in 10/2012.  She also was on Prednisone bridged with Cellcept briefly but stopped due to lack of insurance.   . Pulmonary embolism ([place]) 04/2012  . Renal insufficiency   . Serrated adenoma of colon   . Steroid-induced diabetes ([place])   . Stroke ([place])   . Thrush, oral 05/29/2011    Scheduled:  . amLODipine  5 mg Oral Daily  . aspirin EC  81 mg Oral Daily  . atorvastatin  80 mg Oral q1800  . ciprofloxacin  500 mg Oral BID  . clopidogrel  75 mg Oral Daily  . escitalopram  20 mg Oral Daily  . isosorbide dinitrate  20 mg Oral BID  . metoprolol tartrate  25 mg Oral BID   Infusions:  . sodium chloride 50 mL/hr at 05/30/17 1007  . heparin 900 Units/hr (05/29/17 0131)   Assessment: 51 yo female w/ recurrent CP, just discharged from [facility] on 8/28 for NSTEMI 05/19/17-05/22/17, cath on 05/20/17- 100% occluded distal PDA. Returned to ED on 8/26 for CP- admitted for observation-discharged on 05/25/17. Pharmacy consulted for heparin due to recurrent CP. Possible PCI on Tuesday.  -heparin level = 0.69   Goal of Therapy:  Heparin level 0.3-0.7 units/ml   Plan:  -No heparin changes needed -Daily heparin level and CBC  Hildred Laser, Pharm D 05/30/2017 5:05 PM

## 2017-05-31 DIAGNOSIS — N183 Chronic kidney disease, stage 3 (moderate): Secondary | ICD-10-CM

## 2017-05-31 DIAGNOSIS — N179 Acute kidney failure, unspecified: Secondary | ICD-10-CM

## 2017-05-31 DIAGNOSIS — I25110 Atherosclerotic heart disease of native coronary artery with unstable angina pectoris: Secondary | ICD-10-CM

## 2017-05-31 LAB — CBC
HEMATOCRIT: 36.6 % (ref 36.0–46.0)
HEMOGLOBIN: 11.9 g/dL — AB (ref 12.0–15.0)
MCH: 29.4 pg (ref 26.0–34.0)
MCHC: 32.5 g/dL (ref 30.0–36.0)
MCV: 90.4 fL (ref 78.0–100.0)
Platelets: 179 10*3/uL (ref 150–400)
RBC: 4.05 MIL/uL (ref 3.87–5.11)
RDW: 14.6 % (ref 11.5–15.5)
WBC: 9.5 10*3/uL (ref 4.0–10.5)

## 2017-05-31 LAB — HEPARIN LEVEL (UNFRACTIONATED): HEPARIN UNFRACTIONATED: 0.56 [IU]/mL (ref 0.30–0.70)

## 2017-05-31 LAB — BASIC METABOLIC PANEL
ANION GAP: 4 — AB (ref 5–15)
BUN: 34 mg/dL — ABNORMAL HIGH (ref 6–20)
CHLORIDE: 114 mmol/L — AB (ref 101–111)
CO2: 25 mmol/L (ref 22–32)
CREATININE: 1.97 mg/dL — AB (ref 0.44–1.00)
Calcium: 8.6 mg/dL — ABNORMAL LOW (ref 8.9–10.3)
GFR calc non Af Amer: 28 mL/min — ABNORMAL LOW (ref >60)
GFR, EST AFRICAN AMERICAN: 33 mL/min — AB (ref >60)
Glucose, Bld: 92 mg/dL (ref 65–99)
Potassium: 5 mmol/L (ref 3.5–5.1)
SODIUM: 143 mmol/L (ref 135–145)

## 2017-05-31 MED ORDER — MORPHINE SULFATE (PF) 2 MG/ML IV SOLN
2.0000 mg | Freq: Once | INTRAVENOUS | Status: AC
  Administered 2017-05-31: 2 mg via INTRAVENOUS
  Filled 2017-05-31: qty 1

## 2017-05-31 MED ORDER — ALPRAZOLAM 0.25 MG PO TABS
0.2500 mg | ORAL_TABLET | Freq: Two times a day (BID) | ORAL | Status: DC | PRN
  Administered 2017-05-31: 0.25 mg via ORAL
  Filled 2017-05-31: qty 1

## 2017-05-31 MED ORDER — AMLODIPINE BESYLATE 5 MG PO TABS
2.5000 mg | ORAL_TABLET | Freq: Every day | ORAL | Status: DC
  Administered 2017-06-01 – 2017-06-02 (×2): 2.5 mg via ORAL
  Filled 2017-05-31 (×2): qty 1

## 2017-05-31 NOTE — Progress Notes (Signed)
[place] for heparin Indication: chest pain/ACS  Allergies  Allergen Reactions  . Brintellix [Vortioxetine] Itching  . Yellow Jacket Venom Swelling  . Adhesive [Tape] Rash    Please use paper tape  . Zoloft [Sertraline Hcl] Diarrhea   Patient Measurements: Height: 5\' 5"  (165.1 cm) Weight: 187 lb 1.6 oz (84.9 kg) IBW/kg (Calculated) : 57 HEPARIN DW (KG): 74.6  Vital Signs: Temp: 97.8 F (36.6 C) (09/03 0502) Temp Source: Oral (09/03 0502) BP: 95/61 (09/03 0502) Pulse Rate: 60 (09/03 0502)  Labs:  Recent Labs  05/29/17 0001 05/29/17 0326 05/29/17 0800 05/30/17 0308 05/30/17 1303 05/31/17 0300  HGB 13.0  --  12.0 11.7*  --  11.9*  HCT 39.8  --  36.3 37.2  --  36.6  PLT 197  --  194 184  --  179  APTT 30  --   --   --   --   --   LABPROT 13.5  --   --   --   --   --   INR 1.04  --   --   --   --   --   HEPARINUNFRC  --   --  0.56  --  0.69 0.56  CREATININE 2.39*  --   --   --   --  1.97*  TROPONINI <0.03 <0.03  --   --   --   --    Estimated Creatinine Clearance: 36.4 mL/min (A) (by C-G formula based on SCr of 1.97 mg/dL (H)).  Medical History: Past Medical History:  Diagnosis Date  . Allergy   . Asthma   . CAD (coronary artery disease), native coronary artery    a. cath 05/20/17 - 100% distal PDA --> medical mangement (no intervention done)  . Candida esophagitis ([place])   . Chest pain at rest 04/2017  . Depression   . Essential hypertension, benign   . GERD (gastroesophageal reflux disease)   . Gout   . Hemorrhoids   . 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  Last was Prednisone 40mg ; 2 wk in 10/2012.  She also was on Prednisone bridged with Cellcept briefly but stopped due to lack of insurance.   . Pulmonary embolism ([place]) 04/2012  . Renal insufficiency   . Serrated adenoma of colon   . Steroid-induced diabetes  ([place])   . Stroke ([place])   . Thrush, oral 05/29/2011   Scheduled:  . amLODipine  5 mg Oral Daily  . aspirin EC  81 mg Oral Daily  . atorvastatin  80 mg Oral q1800  . clopidogrel  75 mg Oral Daily  . escitalopram  20 mg Oral Daily  . isosorbide dinitrate  20 mg Oral BID  . metoprolol tartrate  25 mg Oral BID   Infusions:  . sodium chloride 50 mL/hr at 05/31/17 0651  . heparin 900 Units/hr (05/31/17 0809)   Assessment: 51 yo female w/ recurrent CP, just discharged from [facility] on 8/28 for NSTEMI 05/19/17-05/22/17, cath on 05/20/17- 100% occluded distal PDA. Returned to ED on 8/26 for CP- admitted for observation-discharged on 05/25/17. Pharmacy consulted for heparin due to recurrent CP.  Full cards review for possible PCI this week.  -heparin level = 0.56 and within goal without noted bleeding  Med Review:  Amlodipine added for ongoing chest pain - BP tenuous (90's/60's), HR 60's  Goal of Therapy:  Heparin level 0.3-0.7 units/ml  Plan:  -No heparin changes needed -Daily heparin level and CBC  Rober Minion, PharmD., MS Clinical Pharmacist Pager:  [phone] Thank you for allowing pharmacy to be part of this patients care team. 05/31/2017 8:27 AM

## 2017-05-31 NOTE — Progress Notes (Signed)
Progress Note  Patient Name: MAKYNZI EASTLAND Date of Encounter: 05/31/2017  Primary Cardiologist: Dr. Satira Sark  Subjective   No active chest pain or dyspnea. No palpitations. No nausea.  Inpatient Medications    Scheduled Meds: . amLODipine  5 mg Oral Daily  . aspirin EC  81 mg Oral Daily  . atorvastatin  80 mg Oral q1800  . clopidogrel  75 mg Oral Daily  . escitalopram  20 mg Oral Daily  . isosorbide dinitrate  20 mg Oral BID  . metoprolol tartrate  25 mg Oral BID   Continuous Infusions: . heparin 900 Units/hr (05/31/17 0809)   PRN Meds: acetaminophen, albuterol, ipratropium-albuterol, ondansetron (ZOFRAN) IV   Vital Signs    Vitals:   05/30/17 1440 05/30/17 1740 05/30/17 2009 05/31/17 0502  BP: 104/71 106/76 109/87 95/61  Pulse: (!) 54 66 65 60  Resp: 12 (!) 25 14 13   Temp:   99 F (37.2 C) 97.8 F (36.6 C)  TempSrc:   Oral Oral  SpO2: 100% 98% 99% 97%  Weight:      Height:        Intake/Output Summary (Last 24 hours) at 05/31/17 1030 Last data filed at 05/31/17 0809  Gross per 24 hour  Intake          1185.37 ml  Output                0 ml  Net          1185.37 ml   Filed Weights   05/28/17 2354 05/29/17 0455  Weight: 182 lb (82.6 kg) 187 lb 1.6 oz (84.9 kg)    Telemetry    Sinus rhythm. Personally reviewed.  ECG    Tracing from 05/30/2017 shows sinus rhythm with anterolateral T-wave inversions, new from previous left bundle branch block IVCD in August. Personally reviewed.  Physical Exam   GEN: Overweight woman, no acute distress.   Neck: No JVD. Cardiac: RRR, soft systolic murmur, no gallop.  Respiratory: Nonlabored. Clear to auscultation bilaterally. GI: Soft, nontender, bowel sounds present. MS: No edema; No deformity. Neuro:  Nonfocal. Psych: Alert and oriented x 3. Normal affect.  Labs    Chemistry Recent Labs Lab 05/29/17 0001 05/31/17 0300  NA 139 143  K 3.9 5.0  CL 105 114*  CO2 22 25  GLUCOSE 129* 92  BUN  40* 34*  CREATININE 2.39* 1.97*  CALCIUM 8.8* 8.6*  PROT 7.2  --   ALBUMIN 3.9  --   AST 29  --   ALT 23  --   ALKPHOS 94  --   BILITOT 0.7  --   GFRNONAA 22* 28*  GFRAA 26* 33*  ANIONGAP 12 4*     Hematology Recent Labs Lab 05/29/17 0800 05/30/17 0308 05/31/17 0300  WBC 11.2* 9.2 9.5  RBC 4.14 4.20 4.05  HGB 12.0 11.7* 11.9*  HCT 36.3 37.2 36.6  MCV 87.7 88.6 90.4  MCH 29.0 27.9 29.4  MCHC 33.1 31.5 32.5  RDW 14.4 14.3 14.6  PLT 194 184 179    Cardiac Enzymes Recent Labs Lab 05/29/17 0001 05/29/17 0326  TROPONINI <0.03 <0.03   No results for input(s): TROPIPOC in the last 168 hours.    Radiology    No results found.  Cardiac Studies   Echocardiogram 05/21/2017: Study Conclusions  - Left ventricle: The cavity size was normal. Systolic function was   normal. Wall motion was normal; there were no regional wall   motion  abnormalities. Doppler parameters are consistent with   abnormal left ventricular relaxation (grade 1 diastolic   dysfunction). Doppler parameters are consistent with elevated   ventricular end-diastolic filling pressure. - Aortic valve: There was no regurgitation. - Mitral valve: There was mild regurgitation. - Right ventricle: The cavity size was normal. Wall thickness was   normal. Systolic function was normal. - Right atrium: The atrium was normal in size. - Tricuspid valve: There was no regurgitation. - Pulmonic valve: There was no regurgitation. - Pulmonary arteries: Systolic pressure could not be accurately   estimated. - Inferior vena cava: The vessel was normal in size. - Pericardium, extracardiac: There was no pericardial effusion.  Cardiac catheterization 05/20/2017:  RPDA lesion, 100 %stenosed.  The left ventricular systolic function is normal.  LV end diastolic pressure is normal.  The left ventricular ejection fraction is 55-65% by visual estimate.   1. 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  Otherwise normal coronary arteries. 3. Normal LV function with very small focal area of distal inferior hypokinesis.  Plan: medical therapy.  Patient Profile     51 y.o. female with a history of recently diagnosed CAD, specifically occlusion of the distal PDA with otherwise no significant disease in the major epicardials, hypertension, and previous stroke. She is readmitted with recurring chest pain, troponin I levels argue against ACS. ECG shows anterolateral T-wave inversions which are new compared to prior IVCD of left bundle branch block type. She is being managed medically at this time.  Assessment & Plan    1. Recurrent chest pain, symptom-free this morning. Treating for potential angina with recently documented occluded distal PDA, but no definitive ACS by troponin I levels. Currently on aspirin, Plavix, Lipitor, Norvasc, Lopressor, and Isordil. Blood pressure low normal.  2. Essential hypertension, blood pressure low normal now in the setting of recent medication additions for angina.  3. Acute on chronic renal failure, creatinine down to 1.97 with hydration. Has CKD stage 3 at baseline.  Continue gentle hydration, hold off on ARB or ACE inhibitor with recheck BMET in the morning. I do not anticipate a follow-up cardiac catheterization at this time however, particularly if she is able to be managed medically with adequate symptom control. Will reduce Norvasc to 2.5 mg daily to avoid hypotension. She may need further up titration of nitrates or even Ranexa. Continue heparin for now although would plan to discontinue in the next 24 hours and increase activity.  Signed, Rozann Lesches, MD  05/31/2017, 10:30 AM

## 2017-05-31 NOTE — Progress Notes (Signed)
Patient ambulated in hallway with family. Hemi Chacko, Bettina Gavia rN

## 2017-06-01 DIAGNOSIS — I25758 Atherosclerosis of native coronary artery of transplanted heart with other forms of angina pectoris: Secondary | ICD-10-CM

## 2017-06-01 LAB — BASIC METABOLIC PANEL
ANION GAP: 5 (ref 5–15)
BUN: 31 mg/dL — ABNORMAL HIGH (ref 6–20)
CALCIUM: 8.5 mg/dL — AB (ref 8.9–10.3)
CHLORIDE: 112 mmol/L — AB (ref 101–111)
CO2: 23 mmol/L (ref 22–32)
Creatinine, Ser: 1.89 mg/dL — ABNORMAL HIGH (ref 0.44–1.00)
GFR calc non Af Amer: 30 mL/min — ABNORMAL LOW (ref >60)
GFR, EST AFRICAN AMERICAN: 34 mL/min — AB (ref >60)
Glucose, Bld: 85 mg/dL (ref 65–99)
Potassium: 4.6 mmol/L (ref 3.5–5.1)
SODIUM: 140 mmol/L (ref 135–145)

## 2017-06-01 LAB — HEPARIN LEVEL (UNFRACTIONATED): HEPARIN UNFRACTIONATED: 0.33 [IU]/mL (ref 0.30–0.70)

## 2017-06-01 LAB — CBC
HCT: 35.8 % — ABNORMAL LOW (ref 36.0–46.0)
Hemoglobin: 11.3 g/dL — ABNORMAL LOW (ref 12.0–15.0)
MCH: 28.3 pg (ref 26.0–34.0)
MCHC: 31.6 g/dL (ref 30.0–36.0)
MCV: 89.7 fL (ref 78.0–100.0)
Platelets: 188 10*3/uL (ref 150–400)
RBC: 3.99 MIL/uL (ref 3.87–5.11)
RDW: 14.6 % (ref 11.5–15.5)
WBC: 9.8 10*3/uL (ref 4.0–10.5)

## 2017-06-01 NOTE — Discharge Summary (Addendum)
The patient has been seen in conjunction with Harlan Stains, NP-C. All aspects of care have been considered and discussed. The patient has been personally interviewed, examined, and all clinical data has been reviewed.   Patient is ambulated without chest discomfort.  Digital catheterization images have been reviewed. All laboratory data is stable. Stage 3 chronic kidney disease is noted. Encourage by mouth fluid intake. Will need to have basic metabolic panel rechecked at follow-up.  Plan discharge today with follow-up with Dr. Domenic Polite in 7-10 days. Continue medication as listed including the proton pump inhibitor.  Discharge Summary    Patient ID: Summer Cook,  MRN: [id number], DOB/AGE: 04-21-1966 51 y.o.  Admit date: 05/28/2017 Discharge date: 06/02/2017  Primary Care Provider: [employer] Primary Cardiologist: Domenic Polite  Discharge Diagnoses    Active Problems:   NSTEMI (non-ST elevated myocardial infarction) [facility])   Unstable angina (HCC)   Allergies Allergies  Allergen Reactions  . Brintellix [Vortioxetine] Itching  . Yellow Jacket Venom Swelling  . Adhesive [Tape] Rash    Please use paper tape  . Zoloft [Sertraline Hcl] Diarrhea    Diagnostic Studies/Procedures    N/a _____________   History of Present Illness     Summer Cook is a 51 y.o. female with a history of CVA, HTN, depression, CKD stage III and ITP s/p splenectomy, hx of PE 2013 when plt where 11,000, was on coumadin but no longer anticoagulated and subsequent CTs negative who presented with CP. Summer Cook stated that she woke up on Friday morning with a pressure like sensation in her chest.  She took 3 NTG SL tabs (5 min apart) and the pain eventually subsided.  At 9pm after dinner, the pain recurred.  She took 3 more NTG but didn't feel any improvement in symptoms.  She stated she felt lightheaded but did not have any presyncope.  She presented to [facility] where her CP continued requiring  initiation of a NTG infusion.  After this was started and she received morphine, she said her pain subsided around 3 am. She was transferred to [facility] for further evaluation by cardiology.  Summer Cook was recently discharged from the cardiology service on 8/28 after her second NSTEMI admission in the past week.  Initially she presented on 8/22 and cardiac cath showed a 100% occlusion of the dPDA with otherwise normal coronaries, so medical mangement was pursued (started on ASA, BB, plavix, statin).  She represented on 8/26 with recurrent CP and required nitropaste and morphine.  Her troponins were downtrending compared to her recent admission so her pain was felt to be from her residual ischemia from her initial NSTEMI.  She was started on isordil 10 BID and observed.  She stated that her current symptoms were worse than this second presentation but better than her initial. Admitted with plans to escalation of medical therapy.   Hospital Course      Troponin was cycled at neg x2. She was continued on IV heparin until 05/31/17. Her therapy was adjusted to add amlodipine, and increased metoprolol 25mg  BID. Also given gentle IV hydration. Norvasc was reduced from 5mg  to 2.5mg  given low blood pressure. She was able to ambulate without further reports of chest pain. Cr noted at 2.39 on admission with improvement 1.97>>1.89, slight increase to 2.01. Will plan for repeat BMET in one week.   General: Well developed, well nourished, female appearing in no acute distress. Head: Normocephalic, atraumatic.  Neck: Supple without bruits, JVD. Lungs:  Resp  regular and unlabored, CTA. Heart: RRR, S1, S2, no S3, S4, or murmur; no rub. Abdomen: Soft, non-tender, non-distended with normoactive bowel sounds. No hepatomegaly. No rebound/guarding. No obvious abdominal masses. Extremities: No clubbing, cyanosis, edema. Distal pedal pulses are 2+ bilaterally. Neuro: Alert and oriented X 3. Moves all extremities  spontaneously. Psych: Normal affect.  Summer Cook was seen by Dr. Tamala Julian and determined stable for discharge home. Follow up in the office has been arranged. Medications are listed below.   _____________  Discharge Vitals Blood pressure 93/61, pulse (!) 56, temperature 97.9 F (36.6 C), temperature source Oral, resp. rate 18, height 5\' 5"  (1.651 m), weight 187 lb 1.6 oz (84.9 kg), last menstrual period 08/13/2011, SpO2 95 %.  Filed Weights   05/28/17 2354 05/29/17 0455  Weight: 182 lb (82.6 kg) 187 lb 1.6 oz (84.9 kg)    Labs & Radiologic Studies    CBC  Recent Labs  06/01/17 0531 06/02/17 0512  WBC 9.8 9.1  HGB 11.3* 11.9*  HCT 35.8* 36.9  MCV 89.7 89.1  PLT 188 818   Basic Metabolic Panel  Recent Labs  06/01/17 0531 06/02/17 0512  NA 140 140  K 4.6 4.6  CL 112* 110  CO2 23 23  GLUCOSE 85 90  BUN 31* 31*  CREATININE 1.89* 2.01*  CALCIUM 8.5* 8.9   Liver Function Tests No results for input(s): AST, ALT, ALKPHOS, BILITOT, PROT, ALBUMIN in the last 72 hours. No results for input(s): LIPASE, AMYLASE in the last 72 hours. Cardiac Enzymes No results for input(s): CKTOTAL, CKMB, CKMBINDEX, TROPONINI in the last 72 hours. BNP Invalid input(s): POCBNP D-Dimer No results for input(s): DDIMER in the last 72 hours. Hemoglobin A1C No results for input(s): HGBA1C in the last 72 hours. Fasting Lipid Panel No results for input(s): CHOL, HDL, LDLCALC, TRIG, CHOLHDL, LDLDIRECT in the last 72 hours. Thyroid Function Tests No results for input(s): TSH, T4TOTAL, T3FREE, THYROIDAB in the last 72 hours.  Invalid input(s): FREET3 _____________  Dg Chest 2 View  Result Date: 05/23/2017 CLINICAL DATA:  Shortness of breath EXAM: CHEST  2 VIEW COMPARISON:  CTA chest dated 05/19/2017 FINDINGS: Lungs are clear.  No pleural effusion or pneumothorax. The heart is normal in size. Mild superior endplate changes at E99. Cholecystectomy clips. IMPRESSION: Normal chest radiographs.  Electronically Signed   By: Julian Hy M.D.   On: 05/23/2017 10:53   Dg Chest 2 View  Result Date: 05/19/2017 CLINICAL DATA:  Chest pain for 2 hours. EXAM: CHEST  2 VIEW COMPARISON:  01/14/2017 FINDINGS: The cardiac silhouette, mediastinal and hilar contours are within normal limits and stable. The lungs are clear. No pleural effusion. The bony thorax is intact. Remote compression deformities are noted involving T12 and T10. IMPRESSION: No acute cardiopulmonary findings. Electronically Signed   By: Marijo Sanes M.D.   On: 05/19/2017 17:31   Ct Angio Chest Pe W And/or Wo Contrast  Result Date: 05/19/2017 CLINICAL DATA:  Chest pain.  History of pulmonary embolism EXAM: CT ANGIOGRAPHY CHEST WITH CONTRAST TECHNIQUE: Multidetector CT imaging of the chest was performed using the standard protocol during bolus administration of intravenous contrast. Multiplanar CT image reconstructions and MIPs were obtained to evaluate the vascular anatomy. CONTRAST:  75 mL Isovue COMPARISON:  None. FINDINGS: Cardiovascular: No filling defects within the pulmonary arteries to suggest acute pulmonary embolism. No acute findings of the aorta or great vessels. No pericardial fluid. No acute findings of the aorta great vessels.  No pericardial fluid. Mediastinum/Nodes:  No axillary supraclavicular adenopathy. No mediastinal or hilar adenopathy. No pericardial fluid esophagus normal Lungs/Pleura: No pulmonary infarction. Mild basilar atelectasis. No pneumonia. No pneumothorax. Upper Abdomen: Limited view of the liver, kidneys, pancreas are unremarkable. Normal adrenal glands. Musculoskeletal: No aggressive osseous lesion. Review of the MIP images confirms the above findings. IMPRESSION: 1. No evidence acute pulmonary embolism. 2. No acute pulmonary parenchymal findings. Electronically Signed   By: Suzy Bouchard M.D.   On: 05/19/2017 18:21   Dg Chest Port 1 View  Result Date: 05/29/2017 CLINICAL DATA:  Chest pain EXAM:  PORTABLE CHEST 1 VIEW COMPARISON:  05/23/2017 FINDINGS: The heart size and mediastinal contours are within normal limits. Both lungs are clear. The visualized skeletal structures are unremarkable. IMPRESSION: No active disease. Electronically Signed   By: Donavan Foil M.D.   On: 05/29/2017 00:43   Disposition   Pt is being discharged home today in good condition.  Follow-up Plans & Appointments    Follow-up Information    Imogene Burn, PA-C Follow up on 06/09/2017.   Specialty:  Cardiology Why:  at 11:30am for your follow up appt.  Contact information: [place] [address] [phone]            Discharge Medications     Medication List    STOP taking these medications   ciprofloxacin 500 MG tablet Commonly known as:  CIPRO     TAKE these medications   amLODipine 2.5 MG tablet Commonly known as:  NORVASC Take 1 tablet (2.5 mg total) by mouth daily.   aspirin 81 MG EC tablet Take 1 tablet (81 mg total) by mouth daily.   atorvastatin 80 MG tablet Commonly known as:  LIPITOR Take 1 tablet (80 mg total) by mouth daily at 6 PM.   clopidogrel 75 MG tablet Commonly known as:  PLAVIX Take 1 tablet (75 mg total) by mouth daily.   escitalopram 20 MG tablet Commonly known as:  LEXAPRO Take 20 mg by mouth daily.   HAIR SKIN AND NAILS FORMULA PO Take 1 tablet by mouth daily.   ipratropium-albuterol 0.5-2.5 (3) MG/3ML Soln Commonly known as:  DUONEB Take 3 mLs by nebulization 3 (three) times daily as needed (shorntess of breath/wheezing). What changed:  reasons to take this   isosorbide dinitrate 10 MG tablet Commonly known as:  ISORDIL Take 1 tablet (10 mg total) by mouth 2 (two) times daily.   metoprolol tartrate 25 MG tablet Commonly known as:  LOPRESSOR Take 1 tablet (25 mg total) by mouth 2 (two) times daily. What changed:  how much to take   nitroGLYCERIN 0.4 MG SL tablet Commonly known as:  NITROSTAT Place 1 tablet (0.4 mg total)  under the tongue every 5 (five) minutes x 3 doses as needed for chest pain.   PROAIR RESPICLICK 620 (90 Base) MCG/ACT Aepb Generic drug:  Albuterol Sulfate Inhale 2 puffs into the lungs every 4 (four) hours as needed (Shortness of breath).         Outstanding Labs/Studies   BMET at follow up appt  Duration of Discharge Encounter   Greater than 30 minutes including physician time.  Signed, Reino Bellis NP-C 06/02/2017, 9:02 AM

## 2017-06-01 NOTE — Progress Notes (Signed)
[place] for heparin Indication: chest pain/ACS  Allergies  Allergen Reactions  . Brintellix [Vortioxetine] Itching  . Yellow Jacket Venom Swelling  . Adhesive [Tape] Rash    Please use paper tape  . Zoloft [Sertraline Hcl] Diarrhea   Patient Measurements: Height: 5\' 5"  (165.1 cm) Weight: 187 lb 1.6 oz (84.9 kg) IBW/kg (Calculated) : 57 HEPARIN DW (KG): 74.6  Vital Signs: Temp: 97.9 F (36.6 C) (09/04 0500) Temp Source: Oral (09/04 0500) BP: 108/73 (09/04 0500) Pulse Rate: 53 (09/04 0500)  Labs:  Recent Labs  05/30/17 0308 05/30/17 1303 05/31/17 0300 06/01/17 0531  HGB 11.7*  --  11.9* 11.3*  HCT 37.2  --  36.6 35.8*  PLT 184  --  179 188  HEPARINUNFRC  --  0.69 0.56 0.33  CREATININE  --   --  1.97* 1.89*   Estimated Creatinine Clearance: 37.9 mL/min (A) (by C-G formula based on SCr of 1.89 mg/dL (H)).  Assessment: 51 yo female w/ recurrent CP, just discharged from [facility] on 8/28 for NSTEMI 05/19/17-05/22/17, cath on 05/20/17- 100% occluded distal PDA. Returned to ED on 8/26 for CP- admitted for observation-discharged on 05/25/17. Pharmacy consulted for heparin due to recurrent CP.   Heparin level = 0.33 this AM, though trending down  Goal of Therapy:  Heparin level 0.3-0.7 units/ml   Plan:  Increase heparin to 1000 units / hr Follow up AM labs  Thank you Anette Guarneri, PharmD [phone] 06/01/2017 10:29 AM

## 2017-06-01 NOTE — Progress Notes (Addendum)
Progress Note  Patient Name: Summer Cook Date of Encounter: 06/01/2017  Primary Cardiologist: Rozann Lesches  Subjective   Troublesome situation and this 51 year old female who is admitted with non-ST elevation myocardial infarction 05/19/2017 and document by cath have occluded distal third of the PDA. Other vessels were widely patent. There was decreased contrast egress from the left anterior descending, suggesting increased intramyocardial vascular tone or microvascular disease.  ECG on this admission with normal sinus rhythm and conduction with precordial T-wave abnormality likely secondary to post bundle branch block conversion to normal.  Currently no chest discomfort.  Inpatient Medications    Scheduled Meds: . amLODipine  2.5 mg Oral Daily  . aspirin EC  81 mg Oral Daily  . atorvastatin  80 mg Oral q1800  . clopidogrel  75 mg Oral Daily  . escitalopram  20 mg Oral Daily  . isosorbide dinitrate  20 mg Oral BID  . metoprolol tartrate  25 mg Oral BID   Continuous Infusions: . heparin 1,000 Units/hr (06/01/17 1115)   PRN Meds: acetaminophen, albuterol, ALPRAZolam, ipratropium-albuterol, ondansetron (ZOFRAN) IV   Vital Signs    Vitals:   06/01/17 0400 06/01/17 0500 06/01/17 1200 06/01/17 1300  BP:  108/73    Pulse: 62 (!) 53 (!) 57 (!) 54  Resp: 16 16 (!) 22 15  Temp:  97.9 F (36.6 C)    TempSrc:  Oral    SpO2: 95% 94% 97% 98%  Weight:      Height:        Intake/Output Summary (Last 24 hours) at 06/01/17 1446 Last data filed at 06/01/17 1230  Gross per 24 hour  Intake             1257 ml  Output                0 ml  Net             1257 ml   Filed Weights   05/28/17 2354 05/29/17 0455  Weight: 182 lb (82.6 kg) 187 lb 1.6 oz (84.9 kg)    Telemetry    Normal sinus rhythm - Personally Reviewed  ECG    From 05/29/2017 revealed poor wave progression V1 through V4 with precordial T-wave inversion. - Personally Reviewed  Physical Exam  No stress.  Appears healthy and is moderately obese. GEN: No acute distress.   Neck: No JVD Cardiac: RRR, no murmurs, rubs, or gallops.  Respiratory: Clear to auscultation bilaterally. GI: Soft, nontender, non-distended  MS: No edema; No deformity. Neuro:  Nonfocal  Psych: Normal affect   Labs    Chemistry Recent Labs Lab 05/29/17 0001 05/31/17 0300 06/01/17 0531  NA 139 143 140  K 3.9 5.0 4.6  CL 105 114* 112*  CO2 22 25 23   GLUCOSE 129* 92 85  BUN 40* 34* 31*  CREATININE 2.39* 1.97* 1.89*  CALCIUM 8.8* 8.6* 8.5*  PROT 7.2  --   --   ALBUMIN 3.9  --   --   AST 29  --   --   ALT 23  --   --   ALKPHOS 94  --   --   BILITOT 0.7  --   --   GFRNONAA 22* 28* 30*  GFRAA 26* 33* 34*  ANIONGAP 12 4* 5     Hematology Recent Labs Lab 05/30/17 0308 05/31/17 0300 06/01/17 0531  WBC 9.2 9.5 9.8  RBC 4.20 4.05 3.99  HGB 11.7* 11.9* 11.3*  HCT 37.2 36.6  35.8*  MCV 88.6 90.4 89.7  MCH 27.9 29.4 28.3  MCHC 31.5 32.5 31.6  RDW 14.3 14.6 14.6  PLT 184 179 188    Cardiac Enzymes Recent Labs Lab 05/29/17 0001 05/29/17 0326  TROPONINI <0.03 <0.03   No results for input(s): TROPIPOC in the last 168 hours.   BNPNo results for input(s): BNP, PROBNP in the last 168 hours.   DDimer No results for input(s): DDIMER in the last 168 hours.   Radiology    No results found.  Cardiac Studies   Echocardiogram 05/21/2017: Study Conclusions  - Left ventricle: The cavity size was normal. Systolic function was normal. Wall motion was normal; there were no regional wall motion abnormalities. Doppler parameters are consistent with abnormal left ventricular relaxation (grade 1 diastolic dysfunction). Doppler parameters are consistent with elevated ventricular end-diastolic filling pressure. - Aortic valve: There was no regurgitation. - Mitral valve: There was mild regurgitation. - Right ventricle: The cavity size was normal. Wall thickness was normal. Systolic function was  normal. - Right atrium: The atrium was normal in size. - Tricuspid valve: There was no regurgitation. - Pulmonic valve: There was no regurgitation. - Pulmonary arteries: Systolic pressure could not be accurately estimated. - Inferior vena cava: The vessel was normal in size. - Pericardium, extracardiac: There was no pericardial effusion.  Cardiac catheterization 05/20/2017: The images were personally reviewed.  RPDA lesion, 100 %stenosed.  The left ventricular systolic function is normal.  LV end diastolic pressure is normal.  The left ventricular ejection fraction is 55-65% by visual estimate.  1. 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 Otherwise normal coronary arteries. 3. Normal LV function with very small focal area of distal inferior hypokinesis.  Patient Profile     51 y.o. female with a history of recently diagnosed CAD, specifically occlusion of the distal PDA with otherwise no significant disease in the major epicardials, hypertension, and previous stroke. She is readmitted with recurring chest pain, troponin I levels argue against ACS. ECG shows anterolateral T-wave inversions which are new compared to prior IVCD of left bundle branch block type. She is being managed medically at this time.  Assessment & Plan    1. Coronary disease with occlusion of the distal PDA. No evidence of recurrent ischemia. Digital images were personally reviewed. Plan to discontinue heparin, ambulate, and discharge on within next 24 hours. 2. Recurring chest pain, possibly angina related to microvascular disease versus noncardiac chest discomfort. Plan to continue the current medical regimen of low-dose beta blocker, long-acting nitrate, statin therapy, and dual antiplatelet therapy. 3. Acute on chronic kidney injury with gradual resolving acute injury noted to be present on admission.  Plan to ambulate and probably discharge in a.m. Repeat BMET in a.m.  Signed, Sinclair Grooms, MD  06/01/2017, 2:46 PM

## 2017-06-01 NOTE — Progress Notes (Addendum)
Late entry: assumed care from going RN; patient alert & oriented; denies CP ; denies SOB; patient up in chair; c/o headache; Tylenol 650 mg PO given  Patient back in bed with no signs of acute distress; patient denies CP; heparin stopped; patient will be discharge tomorrow

## 2017-06-02 LAB — BASIC METABOLIC PANEL
Anion gap: 7 (ref 5–15)
BUN: 31 mg/dL — AB (ref 6–20)
CHLORIDE: 110 mmol/L (ref 101–111)
CO2: 23 mmol/L (ref 22–32)
CREATININE: 2.01 mg/dL — AB (ref 0.44–1.00)
Calcium: 8.9 mg/dL (ref 8.9–10.3)
GFR calc Af Amer: 32 mL/min — ABNORMAL LOW (ref >60)
GFR calc non Af Amer: 28 mL/min — ABNORMAL LOW (ref >60)
GLUCOSE: 90 mg/dL (ref 65–99)
Potassium: 4.6 mmol/L (ref 3.5–5.1)
Sodium: 140 mmol/L (ref 135–145)

## 2017-06-02 LAB — CBC
HEMATOCRIT: 36.9 % (ref 36.0–46.0)
HEMOGLOBIN: 11.9 g/dL — AB (ref 12.0–15.0)
MCH: 28.7 pg (ref 26.0–34.0)
MCHC: 32.2 g/dL (ref 30.0–36.0)
MCV: 89.1 fL (ref 78.0–100.0)
Platelets: 206 10*3/uL (ref 150–400)
RBC: 4.14 MIL/uL (ref 3.87–5.11)
RDW: 15 % (ref 11.5–15.5)
WBC: 9.1 10*3/uL (ref 4.0–10.5)

## 2017-06-02 MED ORDER — METOPROLOL TARTRATE 25 MG PO TABS: 25.0000 mg | ORAL_TABLET | Freq: Two times a day (BID) | ORAL | 6 refills | Status: DC

## 2017-06-02 MED ORDER — AMLODIPINE BESYLATE 2.5 MG PO TABS: 2.5000 mg | ORAL_TABLET | Freq: Every day | ORAL | 2 refills | Status: DC

## 2017-06-02 NOTE — Care Management Important Message (Signed)
Important Message  Patient Details  Name: Summer Cook MRN: [id number] Date of Birth: [birth year]   Medicare Important Message Given:  Yes    Nathen May 06/02/2017, 9:47 AM

## 2017-06-05 ENCOUNTER — Emergency Department (HOSPITAL_COMMUNITY): Payer: Medicare HMO

## 2017-06-05 ENCOUNTER — Observation Stay (HOSPITAL_COMMUNITY)
Admission: EM | Admit: 2017-06-05 | Discharge: 2017-06-06 | Disposition: A | Discharge status: Home / Self Care | Payer: Medicare HMO | Attending: Internal Medicine | Admitting: Internal Medicine

## 2017-06-05 ENCOUNTER — Encounter (HOSPITAL_COMMUNITY): Payer: Self-pay

## 2017-06-05 DIAGNOSIS — I10 Essential (primary) hypertension: Secondary | ICD-10-CM | POA: Diagnosis not present

## 2017-06-05 DIAGNOSIS — M549 Dorsalgia, unspecified: Secondary | ICD-10-CM | POA: Diagnosis not present

## 2017-06-05 DIAGNOSIS — I129 Hypertensive chronic kidney disease with stage 1 through stage 4 chronic kidney disease, or unspecified chronic kidney disease: Secondary | ICD-10-CM | POA: Diagnosis not present

## 2017-06-05 DIAGNOSIS — R531 Weakness: Secondary | ICD-10-CM | POA: Diagnosis not present

## 2017-06-05 DIAGNOSIS — Z8673 Personal history of transient ischemic attack (TIA), and cerebral infarction without residual deficits: Secondary | ICD-10-CM | POA: Diagnosis not present

## 2017-06-05 DIAGNOSIS — Z9081 Acquired absence of spleen: Secondary | ICD-10-CM | POA: Diagnosis not present

## 2017-06-05 DIAGNOSIS — I959 Hypotension, unspecified: Secondary | ICD-10-CM | POA: Diagnosis not present

## 2017-06-05 DIAGNOSIS — I25110 Atherosclerotic heart disease of native coronary artery with unstable angina pectoris: Secondary | ICD-10-CM | POA: Diagnosis not present

## 2017-06-05 DIAGNOSIS — K625 Hemorrhage of anus and rectum: Secondary | ICD-10-CM | POA: Insufficient documentation

## 2017-06-05 DIAGNOSIS — J45909 Unspecified asthma, uncomplicated: Secondary | ICD-10-CM | POA: Insufficient documentation

## 2017-06-05 DIAGNOSIS — I951 Orthostatic hypotension: Secondary | ICD-10-CM | POA: Insufficient documentation

## 2017-06-05 DIAGNOSIS — Z8371 Family history of colonic polyps: Secondary | ICD-10-CM | POA: Insufficient documentation

## 2017-06-05 DIAGNOSIS — I214 Non-ST elevation (NSTEMI) myocardial infarction: Secondary | ICD-10-CM | POA: Insufficient documentation

## 2017-06-05 DIAGNOSIS — I252 Old myocardial infarction: Secondary | ICD-10-CM | POA: Diagnosis not present

## 2017-06-05 DIAGNOSIS — E7800 Pure hypercholesterolemia, unspecified: Secondary | ICD-10-CM | POA: Diagnosis present

## 2017-06-05 DIAGNOSIS — Z86711 Personal history of pulmonary embolism: Secondary | ICD-10-CM | POA: Diagnosis not present

## 2017-06-05 DIAGNOSIS — I25119 Atherosclerotic heart disease of native coronary artery with unspecified angina pectoris: Secondary | ICD-10-CM

## 2017-06-05 DIAGNOSIS — I2510 Atherosclerotic heart disease of native coronary artery without angina pectoris: Secondary | ICD-10-CM | POA: Diagnosis present

## 2017-06-05 DIAGNOSIS — Z91038 Other insect allergy status: Secondary | ICD-10-CM | POA: Insufficient documentation

## 2017-06-05 DIAGNOSIS — T380X5A Adverse effect of glucocorticoids and synthetic analogues, initial encounter: Secondary | ICD-10-CM | POA: Diagnosis not present

## 2017-06-05 DIAGNOSIS — N183 Chronic kidney disease, stage 3 unspecified: Secondary | ICD-10-CM | POA: Diagnosis present

## 2017-06-05 DIAGNOSIS — K219 Gastro-esophageal reflux disease without esophagitis: Secondary | ICD-10-CM | POA: Insufficient documentation

## 2017-06-05 DIAGNOSIS — M109 Gout, unspecified: Secondary | ICD-10-CM | POA: Insufficient documentation

## 2017-06-05 DIAGNOSIS — D693 Immune thrombocytopenic purpura: Secondary | ICD-10-CM | POA: Diagnosis not present

## 2017-06-05 DIAGNOSIS — E782 Mixed hyperlipidemia: Secondary | ICD-10-CM | POA: Insufficient documentation

## 2017-06-05 DIAGNOSIS — Z888 Allergy status to other drugs, medicaments and biological substances status: Secondary | ICD-10-CM | POA: Insufficient documentation

## 2017-06-05 DIAGNOSIS — E1122 Type 2 diabetes mellitus with diabetic chronic kidney disease: Secondary | ICD-10-CM | POA: Diagnosis not present

## 2017-06-05 DIAGNOSIS — D899 Disorder involving the immune mechanism, unspecified: Secondary | ICD-10-CM | POA: Insufficient documentation

## 2017-06-05 DIAGNOSIS — F411 Generalized anxiety disorder: Secondary | ICD-10-CM | POA: Diagnosis not present

## 2017-06-05 DIAGNOSIS — Z6830 Body mass index (BMI) 30.0-30.9, adult: Secondary | ICD-10-CM | POA: Diagnosis not present

## 2017-06-05 DIAGNOSIS — E669 Obesity, unspecified: Secondary | ICD-10-CM | POA: Diagnosis present

## 2017-06-05 DIAGNOSIS — E119 Type 2 diabetes mellitus without complications: Secondary | ICD-10-CM

## 2017-06-05 DIAGNOSIS — R69 Illness, unspecified: Secondary | ICD-10-CM | POA: Diagnosis not present

## 2017-06-05 DIAGNOSIS — F329 Major depressive disorder, single episode, unspecified: Secondary | ICD-10-CM | POA: Diagnosis not present

## 2017-06-05 DIAGNOSIS — Z91048 Other nonmedicinal substance allergy status: Secondary | ICD-10-CM | POA: Insufficient documentation

## 2017-06-05 DIAGNOSIS — G8929 Other chronic pain: Secondary | ICD-10-CM | POA: Diagnosis not present

## 2017-06-05 DIAGNOSIS — Z7902 Long term (current) use of antithrombotics/antiplatelets: Secondary | ICD-10-CM | POA: Insufficient documentation

## 2017-06-05 DIAGNOSIS — Z87891 Personal history of nicotine dependence: Secondary | ICD-10-CM | POA: Insufficient documentation

## 2017-06-05 DIAGNOSIS — R079 Chest pain, unspecified: Secondary | ICD-10-CM | POA: Diagnosis not present

## 2017-06-05 DIAGNOSIS — Z9849 Cataract extraction status, unspecified eye: Secondary | ICD-10-CM | POA: Insufficient documentation

## 2017-06-05 DIAGNOSIS — Z7982 Long term (current) use of aspirin: Secondary | ICD-10-CM | POA: Insufficient documentation

## 2017-06-05 DIAGNOSIS — R072 Precordial pain: Principal | ICD-10-CM

## 2017-06-05 DIAGNOSIS — Z803 Family history of malignant neoplasm of breast: Secondary | ICD-10-CM | POA: Insufficient documentation

## 2017-06-05 HISTORY — DX: Anxiety disorder, unspecified: F41.9

## 2017-06-05 HISTORY — DX: Acute myocardial infarction, unspecified: I21.9

## 2017-06-05 LAB — BASIC METABOLIC PANEL
ANION GAP: 10 (ref 5–15)
BUN: 28 mg/dL — AB (ref 6–20)
CALCIUM: 8.6 mg/dL — AB (ref 8.9–10.3)
CHLORIDE: 108 mmol/L (ref 101–111)
CO2: 20 mmol/L — AB (ref 22–32)
CREATININE: 1.91 mg/dL — AB (ref 0.44–1.00)
GFR, EST AFRICAN AMERICAN: 34 mL/min — AB (ref >60)
GFR, EST NON AFRICAN AMERICAN: 29 mL/min — AB (ref >60)
Glucose, Bld: 110 mg/dL — ABNORMAL HIGH (ref 65–99)
POTASSIUM: 4.1 mmol/L (ref 3.5–5.1)
SODIUM: 138 mmol/L (ref 135–145)

## 2017-06-05 LAB — CBC
HEMATOCRIT: 39.2 % (ref 36.0–46.0)
Hemoglobin: 12.3 g/dL (ref 12.0–15.0)
MCH: 27.9 pg (ref 26.0–34.0)
MCHC: 31.4 g/dL (ref 30.0–36.0)
MCV: 88.9 fL (ref 78.0–100.0)
PLATELETS: 195 10*3/uL (ref 150–400)
RBC: 4.41 MIL/uL (ref 3.87–5.11)
RDW: 14.5 % (ref 11.5–15.5)
WBC: 9.3 10*3/uL (ref 4.0–10.5)

## 2017-06-05 LAB — I-STAT TROPONIN, ED: TROPONIN I, POC: 0 ng/mL (ref 0.00–0.08)

## 2017-06-05 MED ORDER — ACETAMINOPHEN 325 MG PO TABS
ORAL_TABLET | ORAL | Status: AC
  Filled 2017-06-05: qty 2

## 2017-06-05 MED ORDER — ACETAMINOPHEN 325 MG PO TABS
650.0000 mg | ORAL_TABLET | Freq: Once | ORAL | Status: AC
Start: 2017-06-05 — End: 2017-06-05
  Administered 2017-06-05: 650 mg via ORAL

## 2017-06-05 MED ORDER — ASPIRIN 81 MG PO CHEW
324.0000 mg | CHEWABLE_TABLET | Freq: Once | ORAL | Status: AC
  Administered 2017-06-05: 324 mg via ORAL
  Filled 2017-06-05: qty 4

## 2017-06-05 NOTE — ED Provider Notes (Signed)
[facility] DEPT Provider Note   CSN: [id number] Arrival date & time: 06/05/17  1546     History   Chief Complaint Chief Complaint  Patient presents with  . Chest Pain    HPI Summer Cook is a 51 y.o. female.  Summer Cook is a 51 y.o. Female with a history of HTN, CAD, and NSTEMI who presents to the ED complaining of chest pain today. Patient reports she had chest pain beginning this morning that resolved after 3 rounds of nitroglycerin. She reports her on 2 PM her Pap chest pain returned and this was not resolved with nitroglycerin. She reports her chest pain is substernal and radiates to her right arm. She reports at times her right arm feels heavy. She denies any numbness or tingling. She has not taken her aspirin today. She reports developing a headache after taking the nitroglycerin today. Patient is recently admitted on August 31 for an in STEMI. She will 100% occlusion of her dPDA. No stenting. Medical management. Cardiologist is Dr. Domenic Polite. She reports feeling low energy after changes to her blood pressure medications. She denies fevers, coughing, shortness of breath,abdominal pain, nausea, vomiting, numbness, tingling, rashes, leg pain, leg swelling, weakness or syncope.   The history is provided by the patient and medical records. No language interpreter was used.  Chest Pain   Pertinent negatives include no abdominal pain, no back pain, no cough, no dizziness, no fever, no headaches, no nausea, no numbness, no palpitations, no shortness of breath, no vomiting and no weakness.    Past Medical History:  Diagnosis Date  . Allergy   . Asthma   . CAD (coronary artery disease), native coronary artery    a. cath 05/20/17 - 100% distal PDA --> medical mangement (no intervention done)  . Candida esophagitis ([place])   . Chest pain at rest 04/2017  . Depression   . Essential hypertension, benign   . GERD (gastroesophageal reflux disease)   . Gout   . Hemorrhoids   . ITP  (idiopathic thrombocytopenic purpura) 08/2010   Treated with Prednisone, IVIg (transient response), Rituxan (no response), Cytoxan (no response).  Last was Prednisone 61m; 2 wk in 10/2012.  She also was on Prednisone bridged with Cellcept briefly but stopped due to lack of insurance.   . Pulmonary embolism ([facility] 04/2012  . Renal insufficiency   . Serrated adenoma of colon   . 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 Stroke ([facility]   . Thrush, oral 05/29/2011    Patient Active Problem List   Diagnosis Date Noted  . Unstable angina ([facility] 05/29/2017  . CAD (coronary artery disease): total distal RCA EF normal (05/20/17) 05/25/2017  . UTI (urinary tract infection) 05/25/2017  . High cholesterol 05/25/2017  . Chest pain with moderate risk for cardiac etiology 05/23/2017  . Mixed hyperlipidemia   . NSTEMI (non-ST elevated myocardial infarction) ([facility] 05/20/2017  . Diabetes mellitus type 2, uncomplicated ([facility] 068/07/5725 . History of stroke   . Chronic back pain 12/23/2015  . Asthma, chronic 02/16/2015  . Anxiety state 02/16/2015  . CKD (chronic kidney disease) stage 3, GFR 30-59 ml/min 04/28/2014  . Nausea with vomiting 01/25/2013  . Abnormal LFTs 01/25/2013  . Orthostatic hypotension 01/25/2013  . Rectal bleeding 12/26/2012  . History of pulmonary embolism 05/03/2012  . Hemorrhoids 06/04/2011  . 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 Immunosuppressed status ([facility] 06/04/2011  . Depression 06/04/2011  . Obesity 06/04/2011  . Hypertension 03/18/2011  . Idiopathic thrombocytopenic purpura ([facility] 03/18/2011  Past Surgical History:  Procedure Laterality Date  . BONE MARROW BIOPSY    . CARPAL TUNNEL RELEASE    . CARPAL TUNNEL RELEASE Left 05/20/2016   Procedure: CARPAL TUNNEL RELEASE;  Surgeon: Carole Civil, MD;  Location: [facility];  Service: Orthopedics;  Laterality: Left;  . CARPAL TUNNEL RELEASE Right 07/16/2016   Procedure: RIGHT CARPAL TUNNEL RELEASE;  Surgeon: Carole Civil, MD;   Location: [facility];  Service: Orthopedics;  Laterality: Right;  . CATARACT EXTRACTION    . CHOLECYSTECTOMY  2008  . COLONOSCOPY WITH ESOPHAGOGASTRODUODENOSCOPY (EGD) N/A 01/04/2013   Dr. Gala Romney: esophageal plaques with +KOH, hh. Gastric antrum abnormal, bx reactive gastropathy. Anal canal hemorrhoids, colonic tics, normal TI, single polyp (sessile serrated tubular adenoma). Next TCS 12/2017  . LEFT HEART CATH AND CORONARY ANGIOGRAPHY N/A 05/20/2017   Procedure: LEFT HEART CATH AND CORONARY ANGIOGRAPHY;  Surgeon: Martinique, Peter M, MD;  Location: [facility];  Service: Cardiovascular;  Laterality: N/A;  . 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 TEE WITHOUT CARDIOVERSION N/A 01/03/2016   Procedure: TRANSESOPHAGEAL ECHOCARDIOGRAM (TEE) WITH PROPOFOL;  Surgeon: Satira Sark, MD;  Location: [facility];  Service: Cardiovascular;  Laterality: N/A;    OB History    No data available       Home Medications    Prior to Admission medications   Medication Sig Start Date End Date Taking? Authorizing Provider  ALPRAZolam Duanne Moron) 0.5 MG tablet Take 0.5 mg by mouth at bedtime. 05/28/17  Yes [provider]  amLODipine (NORVASC) 2.5 MG tablet Take 1 tablet (2.5 mg total) by mouth daily. 06/02/17  Yes Reino Bellis B, NP  aspirin EC 81 MG EC tablet Take 1 tablet (81 mg total) by mouth daily. 05/23/17  Yes Eileen Stanford, PA-C  atorvastatin (LIPITOR) 80 MG tablet Take 1 tablet (80 mg total) by mouth daily at 6 PM. Patient taking differently: Take 80 mg by mouth at bedtime.  05/22/17  Yes Eileen Stanford, PA-C  clopidogrel (PLAVIX) 75 MG tablet Take 1 tablet (75 mg total) by mouth daily. 05/23/17  Yes Eileen Stanford, PA-C  escitalopram (LEXAPRO) 20 MG tablet Take 20 mg by mouth daily.  05/03/17  Yes [provider]  ipratropium-albuterol (DUONEB) 0.5-2.5 (3) MG/3ML SOLN Take 3 mLs by nebulization 3 (three) times daily as needed (shorntess of breath/wheezing). Patient taking  differently: Take 3 mLs by nebulization 3 (three) times daily as needed (shortness of breath/wheezing).  02/18/15  Yes Kathie Dike, MD  isosorbide dinitrate (ISORDIL) 10 MG tablet Take 1 tablet (10 mg total) by mouth 2 (two) times daily. 05/25/17  Yes Carlota Raspberry, Tiffany, PA-C  metoprolol tartrate (LOPRESSOR) 25 MG tablet Take 1 tablet (25 mg total) by mouth 2 (two) times daily. 06/02/17  Yes Cheryln Manly, NP  Multiple Vitamins-Minerals (HAIR SKIN AND NAILS FORMULA PO) Take 1 tablet by mouth daily.   Yes [provider]  nitroGLYCERIN (NITROSTAT) 0.4 MG SL tablet Place 1 tablet (0.4 mg total) under the tongue every 5 (five) minutes x 3 doses as needed for chest pain. 05/25/17  Yes Carlota Raspberry, Tiffany, PA-C  PROAIR RESPICLICK 010 (90 BASE) MCG/ACT AEPB Inhale 2 puffs into the lungs every 4 (four) hours as needed (Shortness of breath).  12/27/14  Yes [provider]    Family History Family History  Problem Relation Age of Onset  . Cervical cancer Mother   . Lung cancer Father   . Colon polyps Father        ???  .  Pneumonia Brother   . Colon cancer Paternal Grandmother 88  . AAA (abdominal aortic aneurysm) Paternal Grandmother   . Breast cancer Paternal Grandmother   . Colon cancer Paternal Grandfather 11  . Barrett's esophagus Paternal Aunt   . Stomach cancer Neg Hx   . Pancreatic cancer Neg Hx     Social History Social History  Substance Use Topics  . Smoking status: Former Smoker    Years: 0.00    Types: Cigarettes    Quit date: 02/14/2009  . Smokeless tobacco: Never Used  . Alcohol use No     Allergies   Brintellix [vortioxetine]; Yellow jacket venom; Adhesive [tape]; and Zoloft [sertraline hcl]   Review of Systems Review of Systems  Constitutional: Positive for fatigue. Negative for chills and fever.  HENT: Negative for congestion and sore throat.   Eyes: Negative for visual disturbance.  Respiratory: Negative for cough, shortness of breath and wheezing.    Cardiovascular: Positive for chest pain. Negative for palpitations and leg swelling.  Gastrointestinal: Negative for abdominal pain, diarrhea, nausea and vomiting.  Genitourinary: Negative for dysuria.  Musculoskeletal: Negative for back pain and neck pain.  Skin: Negative for rash.  Neurological: Negative for dizziness, syncope, weakness, light-headedness, numbness and headaches.     Physical Exam Updated Vital Signs BP 109/74   Pulse 70   Temp 97.9 F (36.6 C)   Resp 18   Ht 5' 5" (1.651 m)   Wt 82.6 kg (182 lb)   LMP 08/13/2011   SpO2 93%   BMI 30.29 kg/m   Physical Exam  Constitutional: She is oriented to person, place, and time. She appears well-developed and well-nourished. No distress.  Nontoxic appearing.  HENT:  Head: Normocephalic and atraumatic.  Mouth/Throat: Oropharynx is clear and moist.  Eyes: Pupils are equal, round, and reactive to light. Conjunctivae are normal. Right eye exhibits no discharge. Left eye exhibits no discharge.  Neck: Neck supple. No JVD present.  Cardiovascular: Normal rate, regular rhythm, normal heart sounds and intact distal pulses.  Exam reveals no gallop and no friction rub.   No murmur heard. Bilateral radial, posterior tibialis and dorsalis pedis pulses are intact.    Pulmonary/Chest: Effort normal and breath sounds normal. No respiratory distress. She has no wheezes. She has no rales.  Lungs are clear to ascultation bilaterally. Symmetric chest expansion bilaterally. No increased work of breathing. No rales or rhonchi.    Abdominal: Soft. There is no tenderness. There is no guarding.  Musculoskeletal: She exhibits no edema or tenderness.  No LE edema or TTP.   Lymphadenopathy:    She has no cervical adenopathy.  Neurological: She is alert and oriented to person, place, and time. No sensory deficit. Coordination normal.  Skin: Skin is warm and dry. Capillary refill takes less than 2 seconds. No rash noted. She is not diaphoretic. No  erythema. No pallor.  Psychiatric: She has a normal mood and affect. Her behavior is normal.  Nursing note and vitals reviewed.    ED Treatments / Results  Labs (all labs ordered are listed, but only abnormal results are displayed) Labs Reviewed  BASIC METABOLIC PANEL - Abnormal; Notable for the following:       Result Value   CO2 20 (*)    Glucose, Bld 110 (*)    BUN 28 (*)    Creatinine, Ser 1.91 (*)    Calcium 8.6 (*)    GFR calc non Af Amer 29 (*)    GFR calc Af Wyvonnia Lora  34 (*)    All other components within normal limits  CBC  TROPONIN I  TROPONIN I  TROPONIN I  I-STAT TROPONIN, ED  I-STAT TROPONIN, ED    EKG  EKG Interpretation  Date/Time:  Saturday June 05 2017 15:51:27 EDT Ventricular Rate:  83 PR Interval:  178 QRS Duration: 72 QT Interval:  340 QTC Calculation: 399 R Axis:   71 Text Interpretation:  Normal sinus rhythm Low voltage QRS Cannot rule out Anteroseptal infarct , age undetermined Abnormal ECG Confirmed by Lita Mains  MD, DAVID ([id number]) on 06/05/2017 9:44:21 PM       Radiology Dg Chest 2 View  Result Date: 06/05/2017 CLINICAL DATA:  51 year old female with history of pain in the central and left-sided chest radiating into the back. Headache and weakness since this morning. EXAM: CHEST  2 VIEW COMPARISON:  Chest x-ray a 05/29/2017. FINDINGS: Lung volumes are normal. No consolidative airspace disease. No pleural effusions. No pneumothorax. No pulmonary nodule or mass noted. Pulmonary vasculature and the cardiomediastinal silhouette are within normal limits. IMPRESSION: No radiographic evidence of acute cardiopulmonary disease. Electronically Signed   By: Vinnie Langton M.D.   On: 06/05/2017 17:47    Procedures Procedures (including critical care time)  Medications Ordered in ED Medications  acetaminophen (TYLENOL) tablet 650 mg (650 mg Oral Given 06/05/17 1833)  aspirin chewable tablet 324 mg (324 mg Oral Given 06/05/17 2212)     Initial  Impression / Assessment and Plan / ED Course  I have reviewed the triage vital signs and the nursing notes.  Pertinent labs & imaging results that were available during my care of the patient were reviewed by me and considered in my medical decision making (see chart for details).     This is a 51 y.o. Female with a history of HTN, CAD, and NSTEMI who presents to the ED complaining of chest pain today. Patient reports she had chest pain beginning this morning that resolved after 3 rounds of nitroglycerin. She reports her on 2 PM her Pap chest pain returned and this was not resolved with nitroglycerin. She reports her chest pain is substernal and radiates to her right arm. She reports at times her right arm feels heavy. She denies any numbness or tingling. She has not taken her aspirin today. She reports developing a headache after taking the nitroglycerin today. Patient is recently admitted on August 31 for an in STEMI. She will 100% occlusion of her dPDA. No stenting. Medical management. Cardiologist is Dr. Domenic Polite. On exam the patient is afebrile nontoxic appearing. EKG appears unchanged from previous. Lungs clear to auscultation bilaterally. No hypoxia, tachypnea or tachycardia on exam. Initial troponin is not elevated. Chest x-ray is unremarkable. We'll provide the patient with aspirin and repeat her troponin. Based on her history of feels she needs admission for at least ACS rule out. Will consult with cardiology. Patient agrees with plan for admission.  I consulted with cardiology service who would like the patient to be admitted to medicine for ACS rule out and they will see the patient in consult. I consulted with [facility] hospitalist Dr. Mariann Barter who accepted the patient for admission.   This patient was discussed with and evaluated by Dr. Lita Mains who agrees with assessment and plan.   Final Clinical Impressions(s) / ED Diagnoses   Final diagnoses:  Precordial pain  Coronary artery disease  involving native heart with angina pectoris, unspecified vessel or lesion type [facility])    New Prescriptions New Prescriptions   No  medications on file     Sharmaine Base 06/05/17 2319    Julianne Rice, MD 06/06/17 1725

## 2017-06-05 NOTE — H&P (Signed)
Summer Cook VVO:[id number] DOB: 05-Nov-1965 DOA: 06/05/2017     PCP: Redmond School, MD   Outpatient Specialists: [facility],   Patient coming from:   home Lives   With family    Chief Complaint: Chest pain HPI: Summer Cook is a 51 y.o. female with medical history significant of CVA, HTN, depression, GERD, depression, steroid-induced diabetes,  CKD stage III and ITP s/p splenectomy, hx PE 2013 no longer anticoagulated,  NSTEMI 08/22-08/25/2018    Presented with sitting at home when she started to have left-sided chest pain associated with some heaviness and numbness in the right arm similar to prior episodes no associated shortness of breath.  This chest pain first improved with 3 rounds of nitroglycerin but then returned and would not get better with nitroglycerin taken nitroglycerin has induced a headache. Since her last discharge patient had been feeling more lethargic she attributes to new blood pressure medications she has not had any fevers or cough no shortness of breath no abdominal pain no nausea vomiting no diarrhea no diaphoresis no leg swelling. Reports pain is somewhat different from prior episodes now more on the left worse with palpation but also pleuritic. It radiates to the back and hurts a bit with cough. Reports persistent symptoms currently chest pain 5/10 Pain has been constant since 2 PM.  Just discharged by cardiology for the same symptoms 3 days ago please see below:  Recent cardia cath showed 100% distal PDA, otherwise nl cors>>med mgt. She was discharged on ASA, metoprolol, Plavix, Lipitor. No ACE/ARB due to CKD. Next after discharge she developed left lateral chest pain she presented to emergency department he was not improving with nitroglycerin. Cardiology admitted has started her on IV nitroglycerin and heparin. Overnight has resolved she was started on Isordil 10 minute grams twice a day.  On first of September patient presented back to the ER complaining of  chest pain not improved with home nitroglycerin. IV nitroglycerin was initiated and finally improved with morphine. EKG showed new T-wave inversion, she was again treated with IV heparin and nitroglycerin. Amlodipine was added to her regimen with metoprolol was increased to 25 twice a day.   Recently treated for UTI with ciprofloxacin  Regarding pertinent Chronic problems:  ECHO: 05/21/2017 - Left ventricle: The cavity size was normal. Systolic function was normal. Wall motion was normal; there were no regional wall motion abnormalities. Doppler parameters are consistent with abnormal left ventricular relaxation (grade 1 diastolic dysfunction)  CARDIAC CATH: 05/20/2017  RPDA lesion, 100 %stenosed.  The left ventricular systolic function is normal.  LV end diastolic pressure is normal.  The left ventricular ejection fraction is 55-65% by visual estimate.Very small focal distal inferior HK. 1. 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 Otherwise normal coronary arteries. 3. Normal LV function with very small focal area of distal inferior hypokinesis. Plan: medical therapy.  Hx of Prednisone induced DM2 now have resolved  IN ER:  Temp (24hrs), Avg:97.9 F (36.6 C), Min:97.9 F (36.6 C), Max:97.9 F (36.6 C)      on arrival  ED Triage Vitals [06/05/17 1554]  Enc Vitals Group     BP (!) 116/98     Pulse Rate 78     Resp 16     Temp 97.9 F (36.6 C)     Temp Source Oral     SpO2 100 %     Weight 182 lb (82.6 kg)     Height '5\' 5"'$  (1.651 m)  Head Circumference      Peak Flow      Pain Score 10     Pain Loc      Pain Edu?      Excl. in [place]?     Latest RR 18 93% HR 70 BP 109/74  Trop 0.00 NA 138 K 4.1 BUN 28 Cr 1.91 down form 3 days ago WBC 9.3 Hg 12.3 PLT 195  CXR non acute Following Medications were ordered in ER: Medications  acetaminophen (TYLENOL) tablet 650 mg (650 mg Oral Given 06/05/17 1833)  aspirin chewable tablet 324 mg (324 mg  Oral Given 06/05/17 2212)     ER provider discussed case with:  Cardiology Who recommends: admission to medicine We'll see patient in consult in the morning    Hospitalist was called for admission for Chest pain evaluation  Review of Systems:    Pertinent positives include: chest pain, right arm pain,  headaches,  Constitutional:  No weight loss, night sweats, Fevers, chills, fatigue, weight loss  HEENT:  No Difficulty swallowing,Tooth/dental problems,Sore throat,  No sneezing, itching, ear ache, nasal congestion, post nasal drip,  Cardio-vascular:  No Orthopnea, PND, anasarca, dizziness, palpitations.no Bilateral lower extremity swelling  GI:  No heartburn, indigestion, abdominal pain, nausea, vomiting, diarrhea, change in bowel habits, loss of appetite, melena, blood in stool, hematemesis Resp:  no shortness of breath at rest. No dyspnea on exertion, No excess mucus, no productive cough, No non-productive cough, No coughing up of blood.No change in color of mucus.No wheezing. Skin:  no rash or lesions. No jaundice GU:  no dysuria, change in color of urine, no urgency or frequency. No straining to urinate.  No flank pain.  Musculoskeletal:  No joint pain or no joint swelling. No decreased range of motion. No back pain.  Psych:  No change in mood or affect. No depression or anxiety. No memory loss.  Neuro: no localizing neurological complaints, no tingling, no weakness, no double vision, no gait abnormality, no slurred speech, no confusion  As per HPI otherwise 10 point review of systems negative.   Past Medical History: Past Medical History:  Diagnosis Date  . Allergy   . Asthma   . CAD (coronary artery disease), native coronary artery    a. cath 05/20/17 - 100% distal PDA --> medical mangement (no intervention done)  . Candida esophagitis ([place])   . Chest pain at rest 04/2017  . Depression   . Essential hypertension, benign   . GERD (gastroesophageal reflux disease)     . Gout   . Hemorrhoids   . 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  Last was Prednisone '40mg'$ ; 2 wk in 10/2012.  She also was on Prednisone bridged with Cellcept briefly but stopped due to lack of insurance.   . Pulmonary embolism ([place]) 04/2012  . Renal insufficiency   . Serrated adenoma of colon   . Steroid-induced diabetes ([place])   . Stroke ([place])   . Thrush, oral 05/29/2011   Past Surgical History:  Procedure Laterality Date  . BONE MARROW BIOPSY    . CARPAL TUNNEL RELEASE    . CARPAL TUNNEL RELEASE Left 05/20/2016   Procedure: CARPAL TUNNEL RELEASE;  Surgeon: Carole Civil, MD;  Location: [facility];  Service: Orthopedics;  Laterality: Left;  . CARPAL TUNNEL RELEASE Right 07/16/2016   Procedure: RIGHT CARPAL TUNNEL RELEASE;  Surgeon: Carole Civil, MD;  Location: [facility];  Service:  Orthopedics;  Laterality: Right;  . CATARACT EXTRACTION    . CHOLECYSTECTOMY  2008  . COLONOSCOPY WITH ESOPHAGOGASTRODUODENOSCOPY (EGD) N/A 01/04/2013   Dr. Gala Romney: esophageal plaques with +KOH, hh. Gastric antrum abnormal, bx reactive gastropathy. Anal canal hemorrhoids, colonic tics, normal TI, single polyp (sessile serrated tubular adenoma). Next TCS 12/2017  . LEFT HEART CATH AND CORONARY ANGIOGRAPHY N/A 05/20/2017   Procedure: LEFT HEART CATH AND CORONARY ANGIOGRAPHY;  Surgeon: Martinique, Peter M, MD;  Location: [facility];  Service: Cardiovascular;  Laterality: N/A;  . 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 TEE WITHOUT CARDIOVERSION N/A 01/03/2016   Procedure: TRANSESOPHAGEAL ECHOCARDIOGRAM (TEE) WITH PROPOFOL;  Surgeon: Satira Sark, MD;  Location: [facility];  Service: Cardiovascular;  Laterality: N/A;     Social History:  Ambulatory   independently      reports that she quit smoking about 8 years ago. Her smoking use included Cigarettes. She quit after 0.00 years of use. She has never  used smokeless tobacco. She reports that she does not drink alcohol or use drugs.  Allergies:   Allergies  Allergen Reactions  . Brintellix [Vortioxetine] Itching  . Yellow Jacket Venom Swelling  . Adhesive [Tape] Rash    Please use paper tape  . Zoloft [Sertraline Hcl] Diarrhea       Family History:   Family History  Problem Relation Age of Onset  . Cervical cancer Mother   . Lung cancer Father   . Colon polyps Father        ???  . Pneumonia Brother   . Colon cancer Paternal Grandmother 88  . AAA (abdominal aortic aneurysm) Paternal Grandmother   . Breast cancer Paternal Grandmother   . Colon cancer Paternal Grandfather 100  . Barrett's esophagus Paternal Aunt   . Stomach cancer Neg Hx   . Pancreatic cancer Neg Hx     Medications: Prior to Admission medications   Medication Sig Start Date End Date Taking? Authorizing Provider  ALPRAZolam Duanne Moron) 0.5 MG tablet Take 0.5 mg by mouth at bedtime. 05/28/17  Yes [provider]  amLODipine (NORVASC) 2.5 MG tablet Take 1 tablet (2.5 mg total) by mouth daily. 06/02/17  Yes Reino Bellis B, NP  aspirin EC 81 MG EC tablet Take 1 tablet (81 mg total) by mouth daily. 05/23/17  Yes Eileen Stanford, PA-C  atorvastatin (LIPITOR) 80 MG tablet Take 1 tablet (80 mg total) by mouth daily at 6 PM. Patient taking differently: Take 80 mg by mouth at bedtime.  05/22/17  Yes Eileen Stanford, PA-C  clopidogrel (PLAVIX) 75 MG tablet Take 1 tablet (75 mg total) by mouth daily. 05/23/17  Yes Eileen Stanford, PA-C  escitalopram (LEXAPRO) 20 MG tablet Take 20 mg by mouth daily.  05/03/17  Yes [provider]  ipratropium-albuterol (DUONEB) 0.5-2.5 (3) MG/3ML SOLN Take 3 mLs by nebulization 3 (three) times daily as needed (shorntess of breath/wheezing). Patient taking differently: Take 3 mLs by nebulization 3 (three) times daily as needed (shortness of breath/wheezing).  02/18/15  Yes Kathie Dike, MD  isosorbide dinitrate  (ISORDIL) 10 MG tablet Take 1 tablet (10 mg total) by mouth 2 (two) times daily. 05/25/17  Yes Carlota Raspberry, Tiffany, PA-C  metoprolol tartrate (LOPRESSOR) 25 MG tablet Take 1 tablet (25 mg total) by mouth 2 (two) times daily. 06/02/17  Yes Cheryln Manly, NP  Multiple Vitamins-Minerals (HAIR SKIN AND NAILS FORMULA PO) Take 1 tablet by mouth daily.   Yes [provider]  nitroGLYCERIN (  NITROSTAT) 0.4 MG SL tablet Place 1 tablet (0.4 mg total) under the tongue every 5 (five) minutes x 3 doses as needed for chest pain. 05/25/17  Yes Carlota Raspberry, Tiffany, PA-C  PROAIR RESPICLICK 161 (90 BASE) MCG/ACT AEPB Inhale 2 puffs into the lungs every 4 (four) hours as needed (Shortness of breath).  12/27/14  Yes [provider]    Physical Exam: Patient Vitals for the past 24 hrs:  BP Temp Temp src Pulse Resp SpO2 Height Weight  06/05/17 2130 109/74 - - 70 18 93 % - -  06/05/17 1832 - 97.9 F (36.6 C) - - - - - -  06/05/17 1831 112/87 - - 73 18 100 % - -  06/05/17 1554 (!) 116/98 97.9 F (36.6 C) Oral 78 16 100 % '5\' 5"'$  (1.651 m) 82.6 kg (182 lb)    1. General:  in No Acute distress  well  -appearing 2. Psychological: Alert and Oriented 3. Head/ENT:    Dry Mucous Membranes                          Head Non traumatic, neck supple                            Poor Dentition 4. SKIN:  decreased Skin turgor,  Skin clean Dry and intact no rash 5. Heart: Regular rate and rhythm no Murmur, no Rub or gallop 6. Lungs:  no wheezes or crackles   7. Abdomen: Soft,  non-tender, Non distended   Obese bowel sounds present 8. Lower extremities: no clubbing, cyanosis, or edema 9. Neurologically Grossly intact, moving all 4 extremities equally   10. MSK: Normal range of motion   body mass index is 30.29 kg/m.  Labs on Admission:   Labs on Admission: I have personally reviewed following labs and imaging studies  CBC:  Recent Labs Lab 05/30/17 0308 05/31/17 0300 06/01/17 0531 06/02/17 0512  06/05/17 1556  WBC 9.2 9.5 9.8 9.1 9.3  HGB 11.7* 11.9* 11.3* 11.9* 12.3  HCT 37.2 36.6 35.8* 36.9 39.2  MCV 88.6 90.4 89.7 89.1 88.9  PLT 184 179 188 206 096   Basic Metabolic Panel:  Recent Labs Lab 05/31/17 0300 06/01/17 0531 06/02/17 0512 06/05/17 1556  NA 143 140 140 138  K 5.0 4.6 4.6 4.1  CL 114* 112* 110 108  CO2 '25 23 23 '$ 20*  GLUCOSE 92 85 90 110*  BUN 34* 31* 31* 28*  CREATININE 1.97* 1.89* 2.01* 1.91*  CALCIUM 8.6* 8.5* 8.9 8.6*   GFR: Estimated Creatinine Clearance: 37 mL/min (A) (by C-G formula based on SCr of 1.91 mg/dL (H)). Liver Function Tests: No results for input(s): AST, ALT, ALKPHOS, BILITOT, PROT, ALBUMIN in the last 168 hours. No results for input(s): LIPASE, AMYLASE in the last 168 hours. No results for input(s): AMMONIA in the last 168 hours. Coagulation Profile: No results for input(s): INR, PROTIME in the last 168 hours. Cardiac Enzymes: No results for input(s): CKTOTAL, CKMB, CKMBINDEX, TROPONINI in the last 168 hours. BNP (last 3 results) No results for input(s): PROBNP in the last 8760 hours. HbA1C: No results for input(s): HGBA1C in the last 72 hours. CBG: No results for input(s): GLUCAP in the last 168 hours. Lipid Profile: No results for input(s): CHOL, HDL, LDLCALC, TRIG, CHOLHDL, LDLDIRECT in the last 72 hours. Thyroid Function Tests: No results for input(s): TSH, T4TOTAL, FREET4, T3FREE, THYROIDAB in the last  72 hours. Anemia Panel: No results for input(s): VITAMINB12, FOLATE, FERRITIN, TIBC, IRON, RETICCTPCT in the last 72 hours. Urine analysis:  Sepsis Labs: '@LABRCNTIP'$ (procalcitonin:4,lacticidven:4) )No results found for this or any previous visit (from the past 240 hour(s)).     UA not ordered  Lab Results  Component Value Date   HGBA1C 5.6 05/19/2017    Estimated Creatinine Clearance: 37 mL/min (A) (by C-G formula based on SCr of 1.91 mg/dL (H)).  BNP (last 3 results) No results for input(s): PROBNP in the last 8760  hours.   ECG REPORT  Independently reviewed Rate: 83  Rhythm: NSR with decreased voltage ST&T Change: No acute ischemic changes   QTC 399  Filed Weights   06/05/17 1554  Weight: 82.6 kg (182 lb)     Cultures:    Component Value Date/Time   SDES Urine 05/25/2017 0433   SPECREQUEST NONE 05/25/2017 0433   CULT MULTIPLE SPECIES PRESENT, SUGGEST RECOLLECTION (A) 05/25/2017 0433   REPTSTATUS 05/26/2017 FINAL 05/25/2017 0433     Radiological Exams on Admission: Dg Chest 2 View  Result Date: 06/05/2017 CLINICAL DATA:  51 year old female with history of pain in the central and left-sided chest radiating into the back. Headache and weakness since this morning. EXAM: CHEST  2 VIEW COMPARISON:  Chest x-ray a 05/29/2017. FINDINGS: Lung volumes are normal. No consolidative airspace disease. No pleural effusions. No pneumothorax. No pulmonary nodule or mass noted. Pulmonary vasculature and the cardiomediastinal silhouette are within normal limits. IMPRESSION: No radiographic evidence of acute cardiopulmonary disease. Electronically Signed   By: Vinnie Langton M.D.   On: 06/05/2017 17:47    Chart has been reviewed    Assessment/Plan  51 y.o. female with medical history significant of CVA, HTN, depression, GERD, depression, steroid-induced diabetes,  CKD stage III and ITP s/p splenectomy, hx PE 2013 no longer anticoagulated,  NSTEMI 08/22-08/25/2018   Admitted for Chest pain evaluation  Present on Admission:  . Chest pain with moderate risk for cardiac etiology - admit to telemetry cycle cardiac enzymes appreciate cardiology input for long-term management of recurrent chest pain, somewhat atypical with pleuretic features, could also be MSK in origin. SOFt BP will hold Nitro. Treat with Robaxin obtain Vq scan in AM . CKD (chronic kidney disease) stage 3, GFR 30-59 ml/min -currently based on our avoid nephrotoxic medications . Chronic back pain - stable continue home medications  . CAD  (coronary artery disease): total distal RCA EF normal (05/20/17) - appreciate cardiology input given recurrent chest pain and recurrent admissions in the past week. Unclear chest pain is anginal given distal lesion that was not amendable to treatment . Hypertension - blood pressure running soft patient has been on multiple medications in the past attempted to control chest pain we will defer to cardiology regarding adjustment of her home medications. Since Norvasc did not seem to help with chest pain we'll discontinue for tonight . Idiopathic thrombocytopenic purpura ([place]) - currently resolved . Mixed hyperlipidemia - continue statin   Other plan as per orders.  DVT prophylaxis:  Lovenox     Code Status:  FULL CODE  as per patient    Family Communication:   Family not at  Bedside    Disposition Plan:    To home once workup is complete and patient is stable  Consults called:Cardiology   Admission status:   obs   Level of care        SDU      I have spent a total of 56 min on this admission   Mirela Parsley 06/06/2017, 12:38 AM    [facility]  Pager [phone]   after 2 AM please page floor coverage PA If 7AM-7PM, please contact the day team taking care of the patient  [email or web address]  Password [email or web address]

## 2017-06-05 NOTE — ED Triage Notes (Signed)
Per PT, Pt was sitting at home when she started to have left-sided chest pain. Reports some heaviness in bilateral legs and some numbness in the right arm.  Denies SOB. Complains of headache.

## 2017-06-05 NOTE — ED Notes (Signed)
Pt's daughter approached desk, stated that her mother's chest pain is getting worse and her head is hurting; RN notified

## 2017-06-05 NOTE — ED Notes (Signed)
Admitting doctor at the bedside 

## 2017-06-06 ENCOUNTER — Other Ambulatory Visit (HOSPITAL_COMMUNITY): Payer: Medicare HMO

## 2017-06-06 ENCOUNTER — Encounter (HOSPITAL_COMMUNITY): Payer: Self-pay

## 2017-06-06 ENCOUNTER — Observation Stay (HOSPITAL_COMMUNITY): Payer: Medicare HMO

## 2017-06-06 DIAGNOSIS — Z9081 Acquired absence of spleen: Secondary | ICD-10-CM

## 2017-06-06 DIAGNOSIS — I952 Hypotension due to drugs: Secondary | ICD-10-CM | POA: Diagnosis not present

## 2017-06-06 DIAGNOSIS — I25119 Atherosclerotic heart disease of native coronary artery with unspecified angina pectoris: Secondary | ICD-10-CM

## 2017-06-06 DIAGNOSIS — E1122 Type 2 diabetes mellitus with diabetic chronic kidney disease: Secondary | ICD-10-CM | POA: Diagnosis not present

## 2017-06-06 DIAGNOSIS — Z6830 Body mass index (BMI) 30.0-30.9, adult: Secondary | ICD-10-CM

## 2017-06-06 DIAGNOSIS — E782 Mixed hyperlipidemia: Secondary | ICD-10-CM

## 2017-06-06 DIAGNOSIS — E6609 Other obesity due to excess calories: Secondary | ICD-10-CM

## 2017-06-06 DIAGNOSIS — I129 Hypertensive chronic kidney disease with stage 1 through stage 4 chronic kidney disease, or unspecified chronic kidney disease: Secondary | ICD-10-CM | POA: Diagnosis not present

## 2017-06-06 DIAGNOSIS — R079 Chest pain, unspecified: Secondary | ICD-10-CM

## 2017-06-06 DIAGNOSIS — E669 Obesity, unspecified: Secondary | ICD-10-CM | POA: Diagnosis not present

## 2017-06-06 DIAGNOSIS — N183 Chronic kidney disease, stage 3 (moderate): Secondary | ICD-10-CM | POA: Diagnosis not present

## 2017-06-06 DIAGNOSIS — D693 Immune thrombocytopenic purpura: Secondary | ICD-10-CM | POA: Diagnosis not present

## 2017-06-06 DIAGNOSIS — I959 Hypotension, unspecified: Secondary | ICD-10-CM | POA: Diagnosis not present

## 2017-06-06 DIAGNOSIS — R69 Illness, unspecified: Secondary | ICD-10-CM | POA: Diagnosis not present

## 2017-06-06 DIAGNOSIS — R072 Precordial pain: Secondary | ICD-10-CM | POA: Diagnosis not present

## 2017-06-06 DIAGNOSIS — Z8673 Personal history of transient ischemic attack (TIA), and cerebral infarction without residual deficits: Secondary | ICD-10-CM

## 2017-06-06 LAB — PHOSPHORUS: PHOSPHORUS: 5.1 mg/dL — AB (ref 2.5–4.6)

## 2017-06-06 LAB — COMPREHENSIVE METABOLIC PANEL
ALT: 19 U/L (ref 14–54)
ANION GAP: 9 (ref 5–15)
AST: 21 U/L (ref 15–41)
Albumin: 3.2 g/dL — ABNORMAL LOW (ref 3.5–5.0)
Alkaline Phosphatase: 71 U/L (ref 38–126)
BUN: 23 mg/dL — ABNORMAL HIGH (ref 6–20)
CHLORIDE: 110 mmol/L (ref 101–111)
CO2: 21 mmol/L — AB (ref 22–32)
CREATININE: 1.7 mg/dL — AB (ref 0.44–1.00)
Calcium: 8.7 mg/dL — ABNORMAL LOW (ref 8.9–10.3)
GFR, EST AFRICAN AMERICAN: 39 mL/min — AB (ref >60)
GFR, EST NON AFRICAN AMERICAN: 34 mL/min — AB (ref >60)
Glucose, Bld: 83 mg/dL (ref 65–99)
Potassium: 4.2 mmol/L (ref 3.5–5.1)
SODIUM: 140 mmol/L (ref 135–145)
Total Bilirubin: 0.6 mg/dL (ref 0.3–1.2)
Total Protein: 6.3 g/dL — ABNORMAL LOW (ref 6.5–8.1)

## 2017-06-06 LAB — CBC
HCT: 38 % (ref 36.0–46.0)
HEMOGLOBIN: 12.6 g/dL (ref 12.0–15.0)
MCH: 29.2 pg (ref 26.0–34.0)
MCHC: 33.2 g/dL (ref 30.0–36.0)
MCV: 88.2 fL (ref 78.0–100.0)
PLATELETS: 182 10*3/uL (ref 150–400)
RBC: 4.31 MIL/uL (ref 3.87–5.11)
RDW: 14.9 % (ref 11.5–15.5)
WBC: 9.3 10*3/uL (ref 4.0–10.5)

## 2017-06-06 LAB — POCT I-STAT TROPONIN I: TROPONIN I, POC: 0 ng/mL (ref 0.00–0.08)

## 2017-06-06 LAB — D-DIMER, QUANTITATIVE (NOT AT ARMC): D DIMER QUANT: 0.42 ug{FEU}/mL (ref 0.00–0.50)

## 2017-06-06 LAB — TSH: TSH: 2.861 u[IU]/mL (ref 0.350–4.500)

## 2017-06-06 LAB — TROPONIN I: Troponin I: <0.03 ng/mL (ref <0.03)

## 2017-06-06 LAB — MAGNESIUM: MAGNESIUM: 2 mg/dL (ref 1.7–2.4)

## 2017-06-06 MED ORDER — MORPHINE SULFATE (PF) 4 MG/ML IV SOLN
1.0000 mg | INTRAVENOUS | Status: DC | PRN
  Administered 2017-06-06 (×2): 1 mg via INTRAVENOUS
  Filled 2017-06-06 (×2): qty 1

## 2017-06-06 MED ORDER — ONDANSETRON HCL 4 MG/2ML IJ SOLN: 4.0000 mg | Freq: Four times a day (QID) | INTRAMUSCULAR | Status: DC | PRN

## 2017-06-06 MED ORDER — METOPROLOL TARTRATE 25 MG PO TABS: 12.5000 mg | ORAL_TABLET | Freq: Two times a day (BID) | ORAL | 6 refills | Status: DC

## 2017-06-06 MED ORDER — IPRATROPIUM-ALBUTEROL 0.5-2.5 (3) MG/3ML IN SOLN: 3.0000 mL | Freq: Three times a day (TID) | RESPIRATORY_TRACT | Status: DC | PRN

## 2017-06-06 MED ORDER — ACETAMINOPHEN 650 MG RE SUPP: 650.0000 mg | Freq: Four times a day (QID) | RECTAL | Status: DC | PRN

## 2017-06-06 MED ORDER — ONDANSETRON HCL 4 MG PO TABS: 4.0000 mg | ORAL_TABLET | Freq: Four times a day (QID) | ORAL | Status: DC | PRN

## 2017-06-06 MED ORDER — ACETAMINOPHEN 325 MG PO TABS: 650.0000 mg | ORAL_TABLET | Freq: Four times a day (QID) | ORAL | Status: DC | PRN

## 2017-06-06 MED ORDER — DICLOFENAC SODIUM 1 % TD GEL: 2.0000 g | Freq: Four times a day (QID) | TRANSDERMAL | Status: DC

## 2017-06-06 MED ORDER — DICLOFENAC SODIUM 1 % TD GEL
2.0000 g | Freq: Four times a day (QID) | TRANSDERMAL | Status: DC
  Administered 2017-06-06 (×2): 2 g via TOPICAL
  Filled 2017-06-06: qty 100

## 2017-06-06 MED ORDER — ISOSORBIDE DINITRATE 10 MG PO TABS
10.0000 mg | ORAL_TABLET | Freq: Two times a day (BID) | ORAL | Status: DC
  Administered 2017-06-06: 10 mg via ORAL
  Filled 2017-06-06: qty 1

## 2017-06-06 MED ORDER — ESCITALOPRAM OXALATE 10 MG PO TABS
20.0000 mg | ORAL_TABLET | Freq: Every day | ORAL | Status: DC
  Administered 2017-06-06: 20 mg via ORAL
  Filled 2017-06-06: qty 2

## 2017-06-06 MED ORDER — METHOCARBAMOL 500 MG PO TABS
750.0000 mg | ORAL_TABLET | Freq: Three times a day (TID) | ORAL | Status: DC | PRN
  Administered 2017-06-06: 750 mg via ORAL
  Filled 2017-06-06: qty 2

## 2017-06-06 MED ORDER — HYDROCODONE-ACETAMINOPHEN 5-325 MG PO TABS
1.0000 | ORAL_TABLET | ORAL | Status: DC | PRN
  Administered 2017-06-06: 2 via ORAL
  Filled 2017-06-06: qty 2

## 2017-06-06 MED ORDER — SODIUM CHLORIDE 0.9% FLUSH
3.0000 mL | Freq: Two times a day (BID) | INTRAVENOUS | Status: DC
  Administered 2017-06-06 (×2): 3 mL via INTRAVENOUS

## 2017-06-06 MED ORDER — ACETAMINOPHEN 325 MG PO TABS
650.0000 mg | ORAL_TABLET | Freq: Four times a day (QID) | ORAL | Status: DC | PRN
Start: 2017-06-06 — End: 2017-06-06

## 2017-06-06 MED ORDER — ASPIRIN EC 81 MG PO TBEC
81.0000 mg | DELAYED_RELEASE_TABLET | Freq: Every day | ORAL | Status: DC
  Administered 2017-06-06: 81 mg via ORAL
  Filled 2017-06-06: qty 1

## 2017-06-06 MED ORDER — METOPROLOL TARTRATE 12.5 MG HALF TABLET
12.5000 mg | ORAL_TABLET | Freq: Two times a day (BID) | ORAL | Status: DC
Start: 2017-06-06 — End: 2017-06-06

## 2017-06-06 MED ORDER — METOPROLOL TARTRATE 25 MG PO TABS
25.0000 mg | ORAL_TABLET | Freq: Two times a day (BID) | ORAL | Status: DC
  Administered 2017-06-06: 25 mg via ORAL
  Filled 2017-06-06: qty 1

## 2017-06-06 MED ORDER — CLOPIDOGREL BISULFATE 75 MG PO TABS
75.0000 mg | ORAL_TABLET | Freq: Every day | ORAL | Status: DC
Start: 2017-06-06 — End: 2017-06-06
  Administered 2017-06-06: 75 mg via ORAL
  Filled 2017-06-06: qty 1

## 2017-06-06 MED ORDER — ATORVASTATIN CALCIUM 80 MG PO TABS: 80.0000 mg | ORAL_TABLET | Freq: Every day | ORAL | Status: DC

## 2017-06-06 MED ORDER — ENOXAPARIN SODIUM 40 MG/0.4ML ~~LOC~~ SOLN
40.0000 mg | SUBCUTANEOUS | Status: DC
  Administered 2017-06-06: 40 mg via SUBCUTANEOUS
  Filled 2017-06-06: qty 0.4

## 2017-06-06 MED ORDER — SODIUM CHLORIDE 0.9 % IV SOLN
INTRAVENOUS | Status: DC
  Administered 2017-06-06: 75 mL via INTRAVENOUS

## 2017-06-06 NOTE — ED Notes (Signed)
Report given to rn on 2c 

## 2017-06-06 NOTE — Discharge Summary (Addendum)
Physician Discharge Summary  Summer Cook GNF:[id number] DOB: [birth year] DOA: 06/05/2017  PCP: Redmond School, MD  Admit date: 06/05/2017 Discharge date: 06/06/2017  Admitted From: home Disposition:  home   Recommendations for Outpatient Follow-up:  1. F/u on reproducible chest pain Discharge Condition:  stable   CODE STATUS:  Full code   Consultations:  none    Discharge Diagnoses:  Principal Problem:   Chest pain with moderate risk for cardiac etiology Active Problems:   Hypotension   Idiopathic thrombocytopenic purpura (HCC)   Post-splenectomy   Obesity   CKD (chronic kidney disease) stage 3, GFR 30-59 ml/min   Chronic back pain   History of stroke   Diabetes mellitus type 2, uncomplicated (HCC)   Mixed hyperlipidemia   CAD (coronary artery disease): total distal RCA EF normal (05/20/17)    Subjective: Feels very weak. Has constant pain in left chest. No other complaints.   Brief Summary:   Hospital Course:  Summer Cook is a 51 y.o. female with medical history significant of CVA, HTN, depression, GERD, depression, glucose intolerance,  CKD stage III and ITP s/p splenectomy, hx PE 2013 no longer anticoagulated,  NSTEMI 08/22-08/25/2018    Presented with sitting at home when she started to have left-sided chest pain associated with some heaviness and numbness in the right arm similar to prior episodes no associated shortness of breath.  This chest pain first improved with 3 rounds of nitroglycerin but then returned and would not get better with nitroglycerin taken. Per Dr Thompson Caul note on 9/4 "She presented on 8/22 and cardiac cath showed a 100% occlusion of the dPDA with otherwise normal coronaries, so medical mangement was pursued (started on ASA, BB, plavix, statin). She represented on 8/26 with recurrent CP and required nitropaste and morphine. Her troponins were downtrending compared to her recent admission so her pain was felt to be from her residual ischemia  from her initial NSTEMI. She was started on isordil 10 BID and observed."     Chest pain, hypotension, severe weakness - continued constant chest pain with negative troponin and unrevealing EKG - pain is quite reproducible in left chest wall- start Voltaren gel- she can take tube home with her - feels extremely weak- SBP in low 100s- HR in low 50s and 60s- BP was 93/61 when last discharged-  this is likely due to recent doubling of Metoprolol, addition of Norvasc and Imdur - will d/c Imdur and Norvasc as these are not helping her chest pain- cont Metoprolol for CAD but back to 12.5 mg BID  - D dimer negative- will d/c V/Q that was ordered yesterday - ECHO on 8/24 showed only grade 1 dCHF- d/cECHO what was ordered yesterday - d/c home  CAD -see above- cont ASA, Plavix, Statin, Metoprolol     Discharge Instructions  Discharge Instructions    Diet - low sodium heart healthy    Complete by:  As directed    Increase activity slowly    Complete by:  As directed      Allergies as of 06/06/2017      Reactions   Brintellix [vortioxetine] Itching   Yellow Jacket Venom Swelling   Adhesive [tape] Rash   Please use paper tape   Zoloft [sertraline Hcl] Diarrhea      Medication List    STOP taking these medications   amLODipine 2.5 MG tablet Commonly known as:  NORVASC   isosorbide dinitrate 10 MG tablet Commonly known as:  ISORDIL  TAKE these medications   acetaminophen 325 MG tablet Commonly known as:  TYLENOL Take 2 tablets (650 mg total) by mouth every 6 (six) hours as needed for mild pain (or Fever >/= 101). Can use for chest pain.   ALPRAZolam 0.5 MG tablet Commonly known as:  XANAX Take 0.5 mg by mouth at bedtime.   aspirin 81 MG EC tablet Take 1 tablet (81 mg total) by mouth daily.   atorvastatin 80 MG tablet Commonly known as:  LIPITOR Take 1 tablet (80 mg total) by mouth daily at 6 PM. What changed:  when to take this   clopidogrel 75 MG tablet Commonly  known as:  PLAVIX Take 1 tablet (75 mg total) by mouth daily.   diclofenac sodium 1 % Gel Commonly known as:  VOLTAREN Apply 2 g topically 4 (four) times daily.   escitalopram 20 MG tablet Commonly known as:  LEXAPRO Take 20 mg by mouth daily.   HAIR SKIN AND NAILS FORMULA PO Take 1 tablet by mouth daily.   ipratropium-albuterol 0.5-2.5 (3) MG/3ML Soln Commonly known as:  DUONEB Take 3 mLs by nebulization 3 (three) times daily as needed (shorntess of breath/wheezing). What changed:  reasons to take this   metoprolol tartrate 25 MG tablet Commonly known as:  LOPRESSOR Take 0.5 tablets (12.5 mg total) by mouth 2 (two) times daily. What changed:  how much to take   nitroGLYCERIN 0.4 MG SL tablet Commonly known as:  NITROSTAT Place 1 tablet (0.4 mg total) under the tongue every 5 (five) minutes x 3 doses as needed for chest pain.   PROAIR RESPICLICK 932 (90 Base) MCG/ACT Aepb Generic drug:  Albuterol Sulfate Inhale 2 puffs into the lungs every 4 (four) hours as needed (Shortness of breath).            Discharge Care Instructions        Start     Ordered   06/06/17 0000  acetaminophen (TYLENOL) 325 MG tablet  Every 6 hours PRN     06/06/17 1032   06/06/17 0000  diclofenac sodium (VOLTAREN) 1 % GEL  4 times daily     06/06/17 1032   06/06/17 0000  metoprolol tartrate (LOPRESSOR) 25 MG tablet  2 times daily     06/06/17 1032   06/06/17 0000  Increase activity slowly     06/06/17 1032   06/06/17 0000  Diet - low sodium heart healthy     06/06/17 1032      Allergies  Allergen Reactions  . Brintellix [Vortioxetine] Itching  . Yellow Jacket Venom Swelling  . Adhesive [Tape] Rash    Please use paper tape  . Zoloft [Sertraline Hcl] Diarrhea     Procedures/Studies:    Dg Chest 2 View  Result Date: 06/05/2017 CLINICAL DATA:  51 year old female with history of pain in the central and left-sided chest radiating into the back. Headache and weakness since this  morning. EXAM: CHEST  2 VIEW COMPARISON:  Chest x-ray a 05/29/2017. FINDINGS: Lung volumes are normal. No consolidative airspace disease. No pleural effusions. No pneumothorax. No pulmonary nodule or mass noted. Pulmonary vasculature and the cardiomediastinal silhouette are within normal limits. IMPRESSION: No radiographic evidence of acute cardiopulmonary disease. Electronically Signed   By: Vinnie Langton M.D.   On: 06/05/2017 17:47   Dg Chest 2 View  Result Date: 05/23/2017 CLINICAL DATA:  Shortness of breath EXAM: CHEST  2 VIEW COMPARISON:  CTA chest dated 05/19/2017 FINDINGS: Lungs are clear.  No pleural  effusion or pneumothorax. The heart is normal in size. Mild superior endplate changes at J88. Cholecystectomy clips. IMPRESSION: Normal chest radiographs. Electronically Signed   By: Julian Hy M.D.   On: 05/23/2017 10:53   Dg Chest 2 View  Result Date: 05/19/2017 CLINICAL DATA:  Chest pain for 2 hours. EXAM: CHEST  2 VIEW COMPARISON:  01/14/2017 FINDINGS: The cardiac silhouette, mediastinal and hilar contours are within normal limits and stable. The lungs are clear. No pleural effusion. The bony thorax is intact. Remote compression deformities are noted involving T12 and T10. IMPRESSION: No acute cardiopulmonary findings. Electronically Signed   By: Marijo Sanes M.D.   On: 05/19/2017 17:31   Ct Angio Chest Pe W And/or Wo Contrast  Result Date: 05/19/2017 CLINICAL DATA:  Chest pain.  History of pulmonary embolism EXAM: CT ANGIOGRAPHY CHEST WITH CONTRAST TECHNIQUE: Multidetector CT imaging of the chest was performed using the standard protocol during bolus administration of intravenous contrast. Multiplanar CT image reconstructions and MIPs were obtained to evaluate the vascular anatomy. CONTRAST:  75 mL Isovue COMPARISON:  None. FINDINGS: Cardiovascular: No filling defects within the pulmonary arteries to suggest acute pulmonary embolism. No acute findings of the aorta or great vessels. No  pericardial fluid. No acute findings of the aorta great vessels.  No pericardial fluid. Mediastinum/Nodes: No axillary supraclavicular adenopathy. No mediastinal or hilar adenopathy. No pericardial fluid esophagus normal Lungs/Pleura: No pulmonary infarction. Mild basilar atelectasis. No pneumonia. No pneumothorax. Upper Abdomen: Limited view of the liver, kidneys, pancreas are unremarkable. Normal adrenal glands. Musculoskeletal: No aggressive osseous lesion. Review of the MIP images confirms the above findings. IMPRESSION: 1. No evidence acute pulmonary embolism. 2. No acute pulmonary parenchymal findings. Electronically Signed   By: Suzy Bouchard M.D.   On: 05/19/2017 18:21   Dg Chest Port 1 View  Result Date: 05/29/2017 CLINICAL DATA:  Chest pain EXAM: PORTABLE CHEST 1 VIEW COMPARISON:  05/23/2017 FINDINGS: The heart size and mediastinal contours are within normal limits. Both lungs are clear. The visualized skeletal structures are unremarkable. IMPRESSION: No active disease. Electronically Signed   By: Donavan Foil M.D.   On: 05/29/2017 00:43       Discharge Exam: Vitals:   06/06/17 0600 06/06/17 0900  BP: 107/83 108/77  Pulse: (!) 54 (!) 54  Resp: 14 14  Temp:  (!) 97.4 F (36.3 C)  SpO2: 98% 95%   Vitals:   06/06/17 0400 06/06/17 0500 06/06/17 0600 06/06/17 0900  BP: 105/79 111/77 107/83 108/77  Pulse: 65 (!) 58 (!) 54 (!) 54  Resp: 17 16 14 14   Temp: (!) 97.5 F (36.4 C)   (!) 97.4 F (36.3 C)  TempSrc: Oral   Oral  SpO2: 96% 97% 98% 95%  Weight:      Height:        General: Pt is alert, awake, not in acute distress Cardiovascular: RRR, S1/S2 +, no rubs, no gallops Respiratory: CTA bilaterally, no wheezing, no rhonchi MSK: left chest wall tender to palpation Abdominal: Soft, NT, ND, bowel sounds + Extremities: no edema, no cyanosis    The results of significant diagnostics from this hospitalization (including imaging, microbiology, ancillary and laboratory) are  listed below for reference.     Microbiology: No results found for this or any previous visit (from the past 240 hour(s)).   Labs: BNP (last 3 results)  Recent Labs  05/23/17 1212  BNP 41.6   Basic Metabolic Panel:  Recent Labs Lab 05/31/17 0300 06/01/17 0531 06/02/17 6063  06/05/17 1556 06/06/17 0501  NA 143 140 140 138 140  K 5.0 4.6 4.6 4.1 4.2  CL 114* 112* 110 108 110  CO2 25 23 23  20* 21*  GLUCOSE 92 85 90 110* 83  BUN 34* 31* 31* 28* 23*  CREATININE 1.97* 1.89* 2.01* 1.91* 1.70*  CALCIUM 8.6* 8.5* 8.9 8.6* 8.7*  MG  --   --   --   --  2.0  PHOS  --   --   --   --  5.1*   Liver Function Tests:  Recent Labs Lab 06/06/17 0501  AST 21  ALT 19  ALKPHOS 71  BILITOT 0.6  PROT 6.3*  ALBUMIN 3.2*   No results for input(s): LIPASE, AMYLASE in the last 168 hours. No results for input(s): AMMONIA in the last 168 hours. CBC:  Recent Labs Lab 05/31/17 0300 06/01/17 0531 06/02/17 0512 06/05/17 1556 06/06/17 0501  WBC 9.5 9.8 9.1 9.3 9.3  HGB 11.9* 11.3* 11.9* 12.3 12.6  HCT 36.6 35.8* 36.9 39.2 38.0  MCV 90.4 89.7 89.1 88.9 88.2  PLT 179 188 206 195 182   Cardiac Enzymes:  Recent Labs Lab 06/05/17 2244 06/06/17 0501  TROPONINI <0.03 <0.03   BNP: Invalid input(s): POCBNP CBG: No results for input(s): GLUCAP in the last 168 hours. D-Dimer  Recent Labs  06/05/17 2244  DDIMER 0.42   Hgb A1c No results for input(s): HGBA1C in the last 72 hours. Lipid Profile No results for input(s): CHOL, HDL, LDLCALC, TRIG, CHOLHDL, LDLDIRECT in the last 72 hours. Thyroid function studies  Recent Labs  06/06/17 0501  TSH 2.861   Anemia work up No results for input(s): VITAMINB12, FOLATE, FERRITIN, TIBC, IRON, RETICCTPCT in the last 72 hours. Urinalysis    Component Value Date/Time   COLORURINE YELLOW 05/24/2017 2143   APPEARANCEUR CLOUDY (A) 05/24/2017 2143   LABSPEC 1.010 05/24/2017 2143   PHURINE 5.0 05/24/2017 2143   GLUCOSEU NEGATIVE  05/24/2017 2143   HGBUR SMALL (A) 05/24/2017 2143   BILIRUBINUR NEGATIVE 05/24/2017 2143   KETONESUR NEGATIVE 05/24/2017 2143   PROTEINUR NEGATIVE 05/24/2017 2143   UROBILINOGEN 0.2 02/17/2015 1325   NITRITE POSITIVE (A) 05/24/2017 2143   LEUKOCYTESUR LARGE (A) 05/24/2017 2143   Sepsis Labs Invalid input(s): PROCALCITONIN,  WBC,  LACTICIDVEN Microbiology No results found for this or any previous visit (from the past 240 hour(s)).   Time coordinating discharge: Over 30 minutes  SIGNED:   Debbe Odea, MD  [facility] 06/06/2017, 10:36 AM Pager   If 7PM-7AM, please contact night-coverage [email or web address] Password [email or web address]

## 2017-06-06 NOTE — Discharge Instructions (Signed)
Walk at least 30 mg 3-4 x wk. Try to lose weigth. Eat a low cholesterol, low carb diet.   Please take all your medications with you for your next visit with your Primary MD. Please request your Primary MD to go over all hospital test results at the follow up. Please ask your Primary MD to get all Hospital records sent to his/her office.  If you experience worsening of your admission symptoms, develop shortness of breath, chest pain, suicidal or homicidal thoughts or a life threatening emergency, you must seek medical attention immediately by calling 911 or calling your MD.  Dennis Bast must read the complete instructions/literature along with all the possible adverse reactions/side effects for all the medicines you take including new medications that have been prescribed to you. Take new medicines after you have completely understood and accpet all the possible adverse reactions/side effects.   Do not drive when taking pain medications or sedatives.    Do not take more than prescribed Pain, Sleep and Anxiety Medications  If you have smoked or chewed Tobacco in the last 2 yrs please stop. Stop any regular alcohol and or recreational drug use.  Wear Seat belts while driving.

## 2017-06-06 NOTE — CV Procedure (Signed)
2D echo cancelled, echo was done 8/24 per Dr. Wynelle Cleveland.

## 2017-06-08 ENCOUNTER — Encounter: Payer: Self-pay | Admitting: Orthopedic Surgery

## 2017-06-08 ENCOUNTER — Ambulatory Visit (INDEPENDENT_AMBULATORY_CARE_PROVIDER_SITE_OTHER): Payer: Medicare HMO | Admitting: Orthopedic Surgery

## 2017-06-08 ENCOUNTER — Ambulatory Visit (INDEPENDENT_AMBULATORY_CARE_PROVIDER_SITE_OTHER): Payer: Medicare HMO

## 2017-06-08 VITALS — BP 132/86 | HR 67 | Wt 186.0 lb

## 2017-06-08 DIAGNOSIS — M170 Bilateral primary osteoarthritis of knee: Secondary | ICD-10-CM | POA: Diagnosis not present

## 2017-06-08 DIAGNOSIS — M25561 Pain in right knee: Secondary | ICD-10-CM

## 2017-06-08 DIAGNOSIS — M25562 Pain in left knee: Secondary | ICD-10-CM

## 2017-06-08 NOTE — Patient Instructions (Signed)
Maintain a healthy weight  Exercise per limitations according to cardiologist

## 2017-06-08 NOTE — Progress Notes (Signed)
Patient ID: Summer Cook, female   DOB: Jan 10, 1966, 51 y.o.   MRN: [id number]  Chief Complaint  Patient presents with  . Knee Pain    BILATERAL    HPI Summer Cook is a 51 y.o. female.   51 year old female with recent myocardial infarction and coronary artery disease with blockages recent hospitalization presents with bilateral knee pain secondary to fall. Complains of bilateral dull aching knee pain without swelling which is constant. It is unrelieved by topical medications.     Review of Systems Review of Systems  Musculoskeletal: Positive for arthralgias, gait problem and myalgias.  Neurological: Negative for numbness.   (2 MINIMUM)  Past Medical History:  Diagnosis Date  . Allergy   . Anxiety   . Asthma   . CAD (coronary artery disease), native coronary artery    a. cath 05/20/17 - 100% distal PDA --> medical mangement (no intervention done)  . Candida esophagitis ([place])   . Chest pain at rest 04/2017  . Depression   . Essential hypertension, benign   . GERD (gastroesophageal reflux disease)   . Gout   . Hemorrhoids   . 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  Last was Prednisone 19m; 2 wk in 10/2012.  She also was on Prednisone bridged with Cellcept briefly but stopped due to lack of insurance.   . [place]ocardial infarction ([facility]   . [place]  . Renal insufficiency   . Serrated adenoma of colon   . Steroid-induced diabetes ([place]   . Stroke ([place]   . Thrush, oral 05/29/2011    Past Surgical History:  Procedure Laterality Date  . BONE MARROW BIOPSY    . CARPAL TUNNEL RELEASE    . CARPAL TUNNEL RELEASE Left 05/20/2016   Procedure: CARPAL TUNNEL RELEASE;  Surgeon: SCarole Civil MD;  Location: [facility];  Service: Orthopedics;  Laterality: Left;  . CARPAL TUNNEL RELEASE Right 07/16/2016   Procedure: RIGHT CARPAL TUNNEL RELEASE;   Surgeon: SCarole Civil MD;  Location: [facility];  Service: Orthopedics;  Laterality: Right;  . CATARACT EXTRACTION    . CHOLECYSTECTOMY  2008  . COLONOSCOPY WITH ESOPHAGOGASTRODUODENOSCOPY (EGD) N/A 01/04/2013   Dr. RGala Romney esophageal plaques with +KOH, hh. Gastric antrum abnormal, bx reactive gastropathy. Anal canal hemorrhoids, colonic tics, normal TI, single polyp (sessile serrated tubular adenoma). Next TCS 12/2017  . LEFT HEART CATH AND CORONARY ANGIOGRAPHY N/A 05/20/2017   Procedure: LEFT HEART CATH AND CORONARY ANGIOGRAPHY;  Surgeon: JMartinique Peter M, MD;  [facility];  Service: Cardiovascular;  Laterality: N/A;  . 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 TEE WITHOUT CARDIOVERSION N/A 01/03/2016   Procedure: TRANSESOPHAGEAL ECHOCARDIOGRAM (TEE) WITH PROPOFOL;  Surgeon: SSatira Sark MD;  Location: [facility];  Service: Cardiovascular;  Laterality: N/A;    Social History Social History  Substance Use Topics  . Smoking status: Former Smoker    Years: 0.00    Types: Cigarettes    Quit date: 02/14/2009  . Smokeless tobacco: Never Used  . Alcohol use No    Allergies  Allergen Reactions  . Brintellix [Vortioxetine] Itching  . Yellow Jacket Venom Swelling  . Adhesive [Tape] Rash    Please use paper tape  . Zoloft [Sertraline Hcl] Diarrhea    Current Meds  Medication Sig  . acetaminophen (TYLENOL) 325 MG tablet Take 2 tablets (650 mg total) by mouth every 6 (six) hours as  needed for mild pain (or Fever >/= 101). Can use for chest pain.  Marland Kitchen ALPRAZolam (XANAX) 0.5 MG tablet Take 0.5 mg by mouth at bedtime.  Marland Kitchen aspirin EC 81 MG EC tablet Take 1 tablet (81 mg total) by mouth daily.  Marland Kitchen atorvastatin (LIPITOR) 80 MG tablet Take 1 tablet (80 mg total) by mouth daily at 6 PM. (Patient taking differently: Take 80 mg by mouth at bedtime. )  . clopidogrel (PLAVIX) 75 MG tablet Take 1 tablet (75 mg total) by mouth daily.  . diclofenac sodium (VOLTAREN) 1 % GEL Apply 2 g topically 4  (four) times daily.  Marland Kitchen escitalopram (LEXAPRO) 20 MG tablet Take 20 mg by mouth daily.   Marland Kitchen ipratropium-albuterol (DUONEB) 0.5-2.5 (3) MG/3ML SOLN Take 3 mLs by nebulization 3 (three) times daily as needed (shorntess of breath/wheezing). (Patient taking differently: Take 3 mLs by nebulization 3 (three) times daily as needed (shortness of breath/wheezing). )  . metoprolol tartrate (LOPRESSOR) 25 MG tablet Take 0.5 tablets (12.5 mg total) by mouth 2 (two) times daily.  . Multiple Vitamins-Minerals (HAIR SKIN AND NAILS FORMULA PO) Take 1 tablet by mouth daily.  . nitroGLYCERIN (NITROSTAT) 0.4 MG SL tablet Place 1 tablet (0.4 mg total) under the tongue every 5 (five) minutes x 3 doses as needed for chest pain.  Marland Kitchen PROAIR RESPICLICK 383 (90 BASE) MCG/ACT AEPB Inhale 2 puffs into the lungs every 4 (four) hours as needed (Shortness of breath).       Physical Exam Physical Exam 1.BP 132/86   Pulse 67   Wt 186 lb (84.4 kg)   LMP 08/13/2011   BMI 30.95 kg/m   2. Gen. appearance. The patient is well-developed and well-nourished, grooming and hygiene are normal. There are no gross congenital abnormalities  3. The patient is alert and oriented to person place and time  4. Mood and affect are normal  5. Ambulation Without support other than cadence normal  Examination reveals the following: 6. On inspection we find no effusion in the right or left knee but medial and lateral joint line tenderness in each knee normal patellar mobility  7. With the range of motion of  full right and left knee  8. Stability tests were normal  right and left knee  9. Strength tests revealed grade 5 motor strength in both quadriceps  10. Skin we find no rash ulceration or erythema both legs  11. Sensation remains intact both lower extremities  12 Vascular system shows no peripheral edema, right and left ankle  MEDICAL DECISION MAKING:    Data Reviewed 3 views right knee and office show mild medial arthrosis  x-ray report left knee same  Assessment Encounter Diagnoses  Name Primary?  . Right knee pain, unspecified chronicity Yes  . Primary osteoarthritis of both knees   . Acute pain of both knees      Plan  Procedure note left knee injection verbal consent was obtained to inject left knee joint  Timeout was completed to confirm the site of injection  The medications used were 40 mg of Depo-Medrol and 1% lidocaine 3 cc  Anesthesia was provided by ethyl chloride and the skin was prepped with alcohol.  After cleaning the skin with alcohol a 20-gauge needle was used to inject the left knee joint. There were no complications. A sterile bandage was applied.   Procedure note right knee injection verbal consent was obtained to inject right knee joint  Timeout was completed to confirm the site of injection  The medications used were 40 mg of Depo-Medrol and 1% lidocaine 3 cc  Anesthesia was provided by ethyl chloride and the skin was prepped with alcohol.  After cleaning the skin with alcohol a 20-gauge needle was used to inject the right knee joint. There were no complications. A sterile bandage was applied.   Maintain a healthy weight Exercise per cardiology limitations    Arther Abbott 06/08/2017, 9:55 AM

## 2017-06-09 ENCOUNTER — Ambulatory Visit: Payer: Medicare HMO | Admitting: Physician Assistant

## 2017-06-09 NOTE — Progress Notes (Deleted)
Cardiology Office Note    Date:  06/09/2017   ID:  Summer Cook, DOB [birth year], MRN [id number]  PCP:  Redmond School, MD  Cardiologist: Dr. Domenic Polite  No chief complaint on file.   History of Present Illness:  Summer Cook is a 51 y.o. 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 05/19/17 CT was negative for PE. She ruled in for NSTEMI with peak troponins 2.28. Cardiac cath showed total occlusion of the distal PDA with otherwise normal coronaries some medical management was recommended. Start on aspirin, beta blocker, Plavix and statin. 2-D echo 11/21/16 showed normal LV function grade 1 DD mild MR.  05/23/17 recurrent chest pain requiring nitro paste and morphine pain was felt to be residual ischemia from her and STEMI and started on Isordil 10 mg twice a day. 05/28/17 recurrent symptoms troponins cycled and negative 2 IV heparin continued until 05/31/17. Amlodipine added and metoprolol increased to 25 mg twice a day.  ER 06/05/17 troponins negative 3 d-dimer negative. EKG sinus bradycardia 59 bpm. Renal insufficiency improved from 1.91-1.70. Some antianginal were on hold because of low blood pressures. Amlodipine and Imdur were stopped the metoprolol was decreased to 12.5 mg twice a day.  Past Medical History:  Diagnosis Date  . Allergy   . Anxiety   . Asthma   . CAD (coronary artery disease), native coronary artery    a. cath 05/20/17 - 100% distal PDA --> medical mangement (no intervention done)  . Candida esophagitis ([place])   . Chest pain at rest 04/2017  . Depression   . Essential hypertension, benign   . GERD (gastroesophageal reflux disease)   . Gout   . Hemorrhoids   . ITP (idiopathic thrombocytopenic purpura) 08/2010   Treated with Prednisone, IVIg (transient response), Rituxan (no response),  Cytoxan (no response).  Last was Prednisone 70m; 2 wk in 10/2012.  She also was on Prednisone bridged with Cellcept briefly but stopped due to lack of insurance.   . [place]ocardial infarction ([place]   . [place] 04[place]  . Renal insufficiency   . Serrated adenoma of colon   . Steroid-induced diabetes ([place]   . Stroke ([place]   . Thrush, oral 05/29/2011    Past Surgical History:  Procedure Laterality Date  . BONE MARROW BIOPSY    . CARPAL TUNNEL RELEASE    . CARPAL TUNNEL RELEASE Left 05/20/2016   Procedure: CARPAL TUNNEL RELEASE;  Surgeon: SCarole Civil MD;  Location: [facility];  Service: Orthopedics;  Laterality: Left;  . CARPAL TUNNEL RELEASE Right 07/16/2016   Procedure: RIGHT CARPAL TUNNEL RELEASE;  Surgeon: SCarole Civil MD;  Location: [facility];  Service: Orthopedics;  Laterality: Right;  . CATARACT EXTRACTION    . CHOLECYSTECTOMY  2008  . COLONOSCOPY WITH ESOPHAGOGASTRODUODENOSCOPY (EGD) N/A 01/04/2013   Dr. RGala Romney esophageal plaques with +KOH, hh. Gastric antrum abnormal, bx reactive gastropathy. Anal canal hemorrhoids, colonic tics, normal TI, single polyp (sessile serrated tubular adenoma). Next TCS 12/2017  . LEFT HEART CATH AND CORONARY ANGIOGRAPHY N/A 05/20/2017   Procedure: LEFT HEART CATH AND CORONARY ANGIOGRAPHY;  Surgeon: JMartinique Peter M, MD;  [facility];  Service: Cardiovascular;  Laterality: N/A;  . 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 TEE WITHOUT CARDIOVERSION N/A 01/03/2016   Procedure: TRANSESOPHAGEAL  ECHOCARDIOGRAM (TEE) WITH PROPOFOL;  Surgeon: Samuel G McDowell, MD;  [facility];  Service: Cardiovascular;  Laterality: N/A;    Current Medications: No outpatient prescriptions have been marked as taking for the 06/09/17 encounter (Appointment) with ,  M, PA-C.     Allergies:   Brintellix [vortioxetine]; Yellow jacket venom; Adhesive [tape]; and Zoloft [sertraline hcl]   Social History   Social History  . Marital  status: Divorced    Spouse name: N/A  . Number of children: 1  . Years of education: N/A   Occupational History  .  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    Social History Main Topics  . Smoking status: Former Smoker    Years: 0.00    Types: Cigarettes    Quit date: 02/14/2009  . Smokeless tobacco: Never Used  . Alcohol use No  . Drug use: No  . Sexual activity: No   Other Topics Concern  . Not on file   Social History Narrative   Lives with her daughter and aunt in a one story home.  Has 1 daughter.  On disability.  Did work as a teacher's assistant and bus driver.  Education: some college.      Family History:  The patient's family history includes AAA (abdominal aortic aneurysm) in her paternal grandmother; Barrett's esophagus in her paternal aunt; Breast cancer in her paternal grandmother; Cervical cancer in her mother; Colon cancer (age of onset: 67) in her paternal grandfather; Colon cancer (age of onset: 88) in her paternal grandmother; Colon polyps in her father; Lung cancer in her father; Pneumonia in her brother.   ROS:   Please see the history of present illness.    Review of Systems  Constitution: Negative.  HENT: Negative.   Eyes: Negative.   Cardiovascular: Negative.   Respiratory: Negative.   Hematologic/Lymphatic: Negative.   Musculoskeletal: Negative.  Negative for joint pain.  Gastrointestinal: Negative.   Genitourinary: Negative.   Neurological: Negative.    All other systems reviewed and are negative.   PHYSICAL EXAM:   VS:  LMP 08/13/2011   Physical Exam  GEN: Well nourished, well developed, in no acute distress  HEENT: normal  Neck: no JVD, carotid bruits, or masses Cardiac:RRR; no murmurs, rubs, or gallops  Respiratory:  clear to auscultation bilaterally, normal work of breathing GI: soft, nontender, nondistended, + BS Ext: without cyanosis, clubbing, or edema, Good distal pulses bilaterally MS: no deformity or atrophy  Skin: warm and  dry, no rash Neuro:  Alert and Oriented x 3, Strength and sensation are intact Psych: euthymic mood, full affect  Wt Readings from Last 3 Encounters:  06/08/17 186 lb (84.4 kg)  06/06/17 185 lb 10 oz (84.2 kg)  05/29/17 187 lb 1.6 oz (84.9 kg)      Studies/Labs Reviewed:   EKG:  EKG is*** ordered today.  The ekg ordered today demonstrates ***  Recent Labs: 05/23/2017: B Natriuretic Peptide 43.5 06/06/2017: ALT 19; BUN 23; Creatinine, Ser 1.70; Hemoglobin 12.6; Magnesium 2.0; Platelets 182; Potassium 4.2; Sodium 140; TSH 2.861   Lipid Panel    Component Value Date/Time   CHOL 229 (H) 05/19/2017 1912   TRIG 114 05/19/2017 1912   HDL 57 05/19/2017 1912   CHOLHDL 4.0 05/19/2017 1912   VLDL 23 05/19/2017 1912   LDLCALC 149 (H) 05/19/2017 1912    Additional studies/ records that were reviewed today include:   LEFT HEART CATH AND CORONARY ANGIOGRAPHY  Conclusion         RPDA lesion, 100 %stenosed.  The left ventricular systolic function is normal.  LV end diastolic pressure is normal.  The left ventricular ejection fraction is 55-65% by visual estimate.   1. 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 Otherwise normal coronary arteries. 3. Normal LV function with very small focal area of distal inferior hypokinesis.   Plan: medical therapy.    2D Echo 05/21/17 Study Conclusions   - Left ventricle: The cavity size was normal. Systolic function was   normal. Wall motion was normal; there were no regional wall   motion abnormalities. Doppler parameters are consistent with   abnormal left ventricular relaxation (grade 1 diastolic   dysfunction). Doppler parameters are consistent with elevated   ventricular end-diastolic filling pressure. - Aortic valve: There was no regurgitation. - Mitral valve: There was mild regurgitation. - Right ventricle: The cavity size was normal. Wall thickness was   normal. Systolic function was normal. - Right atrium: The atrium  was normal in size. - Tricuspid valve: There was no regurgitation. - Pulmonic valve: There was no regurgitation. - Pulmonary arteries: Systolic pressure could not be accurately   estimated. - Inferior vena cava: The vessel was normal in size. - Pericardium, extracardiac: There was no pericardial effusion.      ASSESSMENT:    1. Coronary artery disease involving native coronary artery of native heart with angina pectoris (HCC)   2. CKD (chronic kidney disease) stage 3, GFR 30-59 ml/min   3. Class 1 obesity due to excess calories with serious comorbidity and body mass index (BMI) of 30.0 to 30.9 in adult   4. Mixed hyperlipidemia      PLAN:  In order of problems listed above:  CAD with NSTEMI 05/19/17 secondary to total occlusion of the distal PDA with otherwise normal coronary arteries on cath. 2-D echo 05/21/17 normal LVEF, no R Debbie MAs, grade 1 DD mild MR. Patient's had 4 hospitalizations with chest pain in one month. Was recently felt to be musculoskeletal related with Volataren. Multiple medications added and stopped because of orthostatic hypotension.  CK D stage III most recent creatinine 1.709/9/18  Obesity  Hyperlipidemia  Medication Adjustments/Labs and Tests Ordered: Current medicines are reviewed at length with the patient today.  Concerns regarding medicines are outlined above.  Medication changes, Labs and Tests ordered today are listed in the Patient Instructions below. There are no Patient Instructions on file for this visit.   Signed,  , PA-C  06/09/2017 11:29 AM    [facility] [address], [place], [place]  [address] Phone: [phone]; Fax: [phone]    

## 2017-06-10 NOTE — Progress Notes (Signed)
Cardiology Office Note   Date:  06/11/2017   ID:  Summer Cook, DOB [birth year], MRN [id number]  PCP:  Redmond School, MD  Cardiologist:  Domenic Polite Chief Complaint  Patient presents with  . Coronary Artery Disease  . Hypertension    History of Present Illness: Summer Cook is a 51 y.o. female who presents for posthospitalization follow-up after admission for recurrent chest pain, she was seen on consultation. 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 The patient had been admitted several times for recurrent chest pain and was found to be negative for PE.  On 05/20/2017, the patient underwent cardiac catheterization: Conclusion     RPDA lesion, 100 %stenosed.  The left ventricular systolic function is normal.  LV end diastolic pressure is normal.  The left ventricular ejection fraction is 55-65% by visual estimate.   1. 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 Otherwise normal coronary arteries. 3. Normal LV function with very small focal area of distal inferior hypokinesis.  Plan: medical therapy.   The patient was discharged on 06/06/2017, I was continued on medical therapy. The pain was reproducible with palpation of the left chest wall. She is found to be hypotensive during hospitalization, isosorbide and amlodipine were discontinued and she was continued on metoprolol, at lower dose of 12.5 mg twice a day. She was continued on aspirin and Plavix statin.  She comes today with multiple somatic complaints. She is feeling weak and tired, she has constant pain in her back, chest and shoulders. She is quite depressed. She states that she has not been active since being released from the hospital.   Past Medical History:  Diagnosis Date  . Allergy   . Anxiety   . Asthma   . CAD (coronary artery disease), native coronary artery    a.  cath 05/20/17 - 100% distal PDA --> medical mangement (no intervention done)  . Candida esophagitis ([place])   . Chest pain at rest 04/2017  . Depression   . Essential hypertension, benign   . GERD (gastroesophageal reflux disease)   . Gout   . Hemorrhoids   . 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  Last was Prednisone '40mg'$ ; 2 wk in 10/2012.  She also was on Prednisone bridged with Cellcept briefly but stopped due to lack of insurance.   . Myocardial infarction ([place])   . Pulmonary embolism ([place]) 04/2012  . Renal insufficiency   . Serrated adenoma of colon   . Steroid-induced diabetes ([place])   . Stroke ([place])   . Thrush, oral 05/29/2011    Past Surgical History:  Procedure Laterality Date  . BONE MARROW BIOPSY    . CARPAL TUNNEL RELEASE    . CARPAL TUNNEL RELEASE Left 05/20/2016   Procedure: CARPAL TUNNEL RELEASE;  Surgeon: Carole Civil, MD;  Location: [facility];  Service: Orthopedics;  Laterality: Left;  . CARPAL TUNNEL RELEASE Right 07/16/2016   Procedure: RIGHT CARPAL TUNNEL RELEASE;  Surgeon: Carole Civil, MD;  Location: [facility];  Service: Orthopedics;  Laterality: Right;  . CATARACT EXTRACTION    . CHOLECYSTECTOMY  2008  . COLONOSCOPY WITH ESOPHAGOGASTRODUODENOSCOPY (EGD) N/A 01/04/2013   Dr. Gala Romney: esophageal plaques with +KOH, hh. Gastric antrum abnormal, bx reactive gastropathy. Anal canal hemorrhoids, colonic tics, normal TI, single polyp (sessile serrated tubular adenoma). Next TCS  12/2017  . LEFT HEART CATH AND CORONARY ANGIOGRAPHY N/A 05/20/2017   Procedure: LEFT HEART CATH AND CORONARY ANGIOGRAPHY;  Surgeon: Martinique, Peter M, MD;  Location: [facility];  Service: Cardiovascular;  Laterality: N/A;  . 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 TEE WITHOUT CARDIOVERSION N/A 01/03/2016   Procedure: TRANSESOPHAGEAL ECHOCARDIOGRAM (TEE) WITH PROPOFOL;  Surgeon: Satira Sark,  MD;  [facility];  Service: Cardiovascular;  Laterality: N/A;     Current Outpatient Prescriptions  Medication Sig Dispense Refill  . acetaminophen (TYLENOL) 325 MG tablet Take 2 tablets (650 mg total) by mouth every 6 (six) hours as needed for mild pain (or Fever >/= 101). Can use for chest pain.    Marland Kitchen ALPRAZolam (XANAX) 0.5 MG tablet Take 0.5 mg by mouth at bedtime.    Marland Kitchen aspirin EC 81 MG EC tablet Take 1 tablet (81 mg total) by mouth daily.    Marland Kitchen atorvastatin (LIPITOR) 80 MG tablet Take 1 tablet (80 mg total) by mouth daily at 6 PM. (Patient taking differently: Take 80 mg by mouth at bedtime. ) 90 tablet 1  . clopidogrel (PLAVIX) 75 MG tablet Take 1 tablet (75 mg total) by mouth daily. 90 tablet 0  . diclofenac sodium (VOLTAREN) 1 % GEL Apply 2 g topically 4 (four) times daily.    Marland Kitchen escitalopram (LEXAPRO) 20 MG tablet Take 20 mg by mouth daily.     Marland Kitchen ipratropium-albuterol (DUONEB) 0.5-2.5 (3) MG/3ML SOLN Take 3 mLs by nebulization 3 (three) times daily as needed (shorntess of breath/wheezing). (Patient taking differently: Take 3 mLs by nebulization 3 (three) times daily as needed (shortness of breath/wheezing). ) 360 mL 0  . Multiple Vitamins-Minerals (HAIR SKIN AND NAILS FORMULA PO) Take 1 tablet by mouth daily.    . nitroGLYCERIN (NITROSTAT) 0.4 MG SL tablet Place 1 tablet (0.4 mg total) under the tongue every 5 (five) minutes x 3 doses as needed for chest pain. 25 tablet 12  . PROAIR RESPICLICK 937 (90 BASE) MCG/ACT AEPB Inhale 2 puffs into the lungs every 4 (four) hours as needed (Shortness of breath).   3   No current facility-administered medications for this visit.     Allergies:   Brintellix [vortioxetine]; Yellow jacket venom; Adhesive [tape]; and Zoloft [sertraline hcl]    Social History:  The patient  reports that she quit smoking about 8 years ago. Her smoking use included Cigarettes. She quit after 0.00 years of use. She has never used smokeless tobacco. She reports  that she does not drink alcohol or use drugs.   Family History:  The patient's family history includes AAA (abdominal aortic aneurysm) in her paternal grandmother; Barrett's esophagus in her paternal aunt; Breast cancer in her paternal grandmother; Cervical cancer in her mother; Colon cancer (age of onset: 78) in her paternal grandfather; Colon cancer (age of onset: 25) in her paternal grandmother; Colon polyps in her father; Lung cancer in her father; Pneumonia in her brother.    ROS: All other systems are reviewed and negative. Unless otherwise mentioned in H&P    PHYSICAL EXAM: VS:  BP 102/60   Pulse 61   Ht '5\' 5"'$  (1.651 m)   Wt 186 lb (84.4 kg)   LMP 08/13/2011   BMI 30.95 kg/m  , BMI Body mass index is 30.95 kg/m. GEN: Well nourished, well developed, in no acute distress  HEENT: normal  Neck: no JVD, carotid bruits, or masses Cardiac: RRR; no murmurs, rubs, or gallops,no edema  Respiratory:  Clear to auscultation bilaterally, normal work of breathing GI: soft, nontender, nondistended, + BS MS: no deformity or atrophy  Skin: warm and dry, no rash Neuro:  Strength and sensation are intact Psych: Flat affect  Recent Labs: 05/23/2017: B Natriuretic Peptide 43.5 06/06/2017: ALT 19; BUN 23; Creatinine, Ser 1.70; Hemoglobin 12.6; Magnesium 2.0; Platelets 182; Potassium 4.2; Sodium 140; TSH 2.861    Lipid Panel    Component Value Date/Time   CHOL 229 (H) 05/19/2017 1912   TRIG 114 05/19/2017 1912   HDL 57 05/19/2017 1912   CHOLHDL 4.0 05/19/2017 1912   VLDL 23 05/19/2017 1912   LDLCALC 149 (H) 05/19/2017 1912      Wt Readings from Last 3 Encounters:  06/11/17 186 lb (84.4 kg)  06/08/17 186 lb (84.4 kg)  06/06/17 185 lb 10 oz (84.2 kg)     ASSESSMENT AND PLAN:  1.CAD: S/P cardiac cath in the setting of NSTEMI. She had cardiac cath with totally occluded distal PDA.Marland Kitchen She was treated medically with ASA and Plavix. She was also sent home on metoprolol. She continues to feel  worn out. BP is soft. I will stop the metoprolol for now. She is being referred to cardiac rehab for education, help with exercise and self limiting deconditioning. I have encouraged her to go and become more active in a supervised atmosphere. She is interested in attending this. She requires a lot of reassurance.   2. Hypotension: BP is soft on metoprolol at low dose of 12.5 mg BID. I will stop this and monitor her BP response.   3. Depression: She is followed by [name] about this and has recently been placed on Lexapro. She states that it has not been very affective. I have advised her to see him again. He may need to adjust dose or refer to psychiatry for further recommendations at his discretion.   4. Hypercholesterolemia: Continue atorvastatin as directed.    Current medicines are reviewed at length with the patient today.    Labs/ tests ordered today include: Referral to cardiac rehab.   Phill Myron. West Pugh, ANP, AACC   06/11/2017 4:42 PM    [facility] [address] [place], [place] [address] Phone: [phone]; Fax: [phone]

## 2017-06-11 ENCOUNTER — Encounter: Payer: Self-pay | Admitting: Adult Health

## 2017-06-11 ENCOUNTER — Ambulatory Visit (INDEPENDENT_AMBULATORY_CARE_PROVIDER_SITE_OTHER): Payer: Medicare HMO | Admitting: Adult Health

## 2017-06-11 VITALS — BP 102/60 | HR 61 | Ht 65.0 in | Wt 186.0 lb

## 2017-06-11 DIAGNOSIS — F32A Depression, unspecified: Secondary | ICD-10-CM

## 2017-06-11 DIAGNOSIS — I214 Non-ST elevation (NSTEMI) myocardial infarction: Secondary | ICD-10-CM | POA: Diagnosis not present

## 2017-06-11 DIAGNOSIS — I2510 Atherosclerotic heart disease of native coronary artery without angina pectoris: Secondary | ICD-10-CM

## 2017-06-11 DIAGNOSIS — F329 Major depressive disorder, single episode, unspecified: Secondary | ICD-10-CM | POA: Diagnosis not present

## 2017-06-11 DIAGNOSIS — I952 Hypotension due to drugs: Secondary | ICD-10-CM | POA: Diagnosis not present

## 2017-06-11 DIAGNOSIS — R69 Illness, unspecified: Secondary | ICD-10-CM | POA: Diagnosis not present

## 2017-06-11 NOTE — Patient Instructions (Signed)
Medication Instructions:  Your physician recommends that you continue on your current medications as directed. Please refer to the Current Medication list given to you today.   Labwork: NONE   Testing/Procedures: NONE   Follow-Up: Your physician wants you to follow-up in: 3 months.  You will receive a reminder letter in the mail two months in advance. If you don't receive a letter, please call our office to schedule the follow-up appointment.   Any Other Special Instructions Will Be Listed Below (If Applicable).  You have been referred to Cardiac Rehab     If you need a refill on your cardiac medications before your next appointment, please call your pharmacy. Thank you for choosing [facility]!

## 2017-06-14 ENCOUNTER — Emergency Department (HOSPITAL_COMMUNITY): Payer: Medicare HMO

## 2017-06-14 ENCOUNTER — Encounter (HOSPITAL_COMMUNITY): Payer: Self-pay | Admitting: Emergency Medicine

## 2017-06-14 ENCOUNTER — Other Ambulatory Visit: Payer: Self-pay

## 2017-06-14 ENCOUNTER — Emergency Department (HOSPITAL_COMMUNITY)
Admission: EM | Admit: 2017-06-14 | Discharge: 2017-06-14 | Disposition: A | Discharge status: Home / Self Care | Payer: Medicare HMO | Attending: Emergency Medicine | Admitting: Emergency Medicine

## 2017-06-14 DIAGNOSIS — N183 Chronic kidney disease, stage 3 (moderate): Secondary | ICD-10-CM | POA: Insufficient documentation

## 2017-06-14 DIAGNOSIS — R1010 Upper abdominal pain, unspecified: Secondary | ICD-10-CM | POA: Diagnosis not present

## 2017-06-14 DIAGNOSIS — J45909 Unspecified asthma, uncomplicated: Secondary | ICD-10-CM | POA: Insufficient documentation

## 2017-06-14 DIAGNOSIS — I252 Old myocardial infarction: Secondary | ICD-10-CM | POA: Insufficient documentation

## 2017-06-14 DIAGNOSIS — I129 Hypertensive chronic kidney disease with stage 1 through stage 4 chronic kidney disease, or unspecified chronic kidney disease: Secondary | ICD-10-CM | POA: Diagnosis not present

## 2017-06-14 DIAGNOSIS — I2510 Atherosclerotic heart disease of native coronary artery without angina pectoris: Secondary | ICD-10-CM | POA: Diagnosis not present

## 2017-06-14 DIAGNOSIS — R109 Unspecified abdominal pain: Secondary | ICD-10-CM | POA: Diagnosis not present

## 2017-06-14 DIAGNOSIS — E1122 Type 2 diabetes mellitus with diabetic chronic kidney disease: Secondary | ICD-10-CM | POA: Diagnosis not present

## 2017-06-14 DIAGNOSIS — N390 Urinary tract infection, site not specified: Secondary | ICD-10-CM | POA: Diagnosis not present

## 2017-06-14 DIAGNOSIS — Z79899 Other long term (current) drug therapy: Secondary | ICD-10-CM | POA: Insufficient documentation

## 2017-06-14 DIAGNOSIS — Z7982 Long term (current) use of aspirin: Secondary | ICD-10-CM | POA: Diagnosis not present

## 2017-06-14 DIAGNOSIS — Z87891 Personal history of nicotine dependence: Secondary | ICD-10-CM | POA: Diagnosis not present

## 2017-06-14 DIAGNOSIS — R079 Chest pain, unspecified: Secondary | ICD-10-CM | POA: Diagnosis not present

## 2017-06-14 LAB — URINALYSIS, ROUTINE W REFLEX MICROSCOPIC
BILIRUBIN URINE: NEGATIVE
Glucose, UA: NEGATIVE mg/dL
KETONES UR: NEGATIVE mg/dL
NITRITE: NEGATIVE
PROTEIN: NEGATIVE mg/dL
Specific Gravity, Urine: 1.008 (ref 1.005–1.030)
pH: 6 (ref 5.0–8.0)

## 2017-06-14 LAB — CBC
HEMATOCRIT: 40 % (ref 36.0–46.0)
Hemoglobin: 12.9 g/dL (ref 12.0–15.0)
MCH: 29.2 pg (ref 26.0–34.0)
MCHC: 32.3 g/dL (ref 30.0–36.0)
MCV: 90.5 fL (ref 78.0–100.0)
PLATELETS: 218 10*3/uL (ref 150–400)
RBC: 4.42 MIL/uL (ref 3.87–5.11)
RDW: 14.2 % (ref 11.5–15.5)
WBC: 9.7 10*3/uL (ref 4.0–10.5)

## 2017-06-14 LAB — HEPATIC FUNCTION PANEL
ALT: 14 U/L (ref 14–54)
AST: 19 U/L (ref 15–41)
Albumin: 3.6 g/dL (ref 3.5–5.0)
Alkaline Phosphatase: 80 U/L (ref 38–126)
Bilirubin, Direct: 0.1 mg/dL (ref 0.1–0.5)
Indirect Bilirubin: 0.6 mg/dL (ref 0.3–0.9)
Total Bilirubin: 0.7 mg/dL (ref 0.3–1.2)
Total Protein: 6.8 g/dL (ref 6.5–8.1)

## 2017-06-14 LAB — TROPONIN I
Troponin I: <0.03 ng/mL (ref <0.03)
Troponin I: <0.03 ng/mL (ref <0.03)

## 2017-06-14 LAB — BASIC METABOLIC PANEL
Anion gap: 12 (ref 5–15)
BUN: 27 mg/dL — AB (ref 6–20)
CO2: 26 mmol/L (ref 22–32)
CREATININE: 1.73 mg/dL — AB (ref 0.44–1.00)
Calcium: 9.1 mg/dL (ref 8.9–10.3)
Chloride: 104 mmol/L (ref 101–111)
GFR calc Af Amer: 38 mL/min — ABNORMAL LOW (ref >60)
GFR calc non Af Amer: 33 mL/min — ABNORMAL LOW (ref >60)
GLUCOSE: 81 mg/dL (ref 65–99)
Potassium: 4.6 mmol/L (ref 3.5–5.1)
SODIUM: 142 mmol/L (ref 135–145)

## 2017-06-14 LAB — D-DIMER, QUANTITATIVE: D-Dimer, Quant: 0.36 ug{FEU}/mL (ref 0.00–0.50)

## 2017-06-14 MED ORDER — OXYCODONE-ACETAMINOPHEN 5-325 MG PO TABS
1.0000 | ORAL_TABLET | Freq: Once | ORAL | Status: AC
  Administered 2017-06-14: 1 via ORAL
  Filled 2017-06-14: qty 1

## 2017-06-14 MED ORDER — CEPHALEXIN 500 MG PO CAPS: 500.0000 mg | ORAL_CAPSULE | Freq: Four times a day (QID) | ORAL | 0 refills | Status: DC

## 2017-06-14 MED ORDER — ONDANSETRON 4 MG PO TBDP
4.0000 mg | ORAL_TABLET | Freq: Once | ORAL | Status: AC
  Administered 2017-06-14: 4 mg via ORAL
  Filled 2017-06-14: qty 1

## 2017-06-14 MED ORDER — CEPHALEXIN 500 MG PO CAPS
500.0000 mg | ORAL_CAPSULE | Freq: Once | ORAL | Status: AC
  Administered 2017-06-14: 500 mg via ORAL
  Filled 2017-06-14: qty 1

## 2017-06-14 NOTE — ED Notes (Signed)
ED Provider at bedside. 

## 2017-06-14 NOTE — Discharge Instructions (Signed)
Increase your isordil to 20mg  a day and follow up with your md this week.  If your bp gets too low you may need to go back to 10mg  a day of the isordil,  but your md can recheck your bp this week

## 2017-06-14 NOTE — ED Notes (Signed)
EDP informed pt wants to see him.

## 2017-06-14 NOTE — ED Triage Notes (Signed)
Patient complains of chest pain that started yesterday with right arm pain. Patient states she took 3 nitro yesterday with no relief. States nausea that started yesterday. States lower right flank pain that started today. Denies Diarrhea, or urinary symptoms.

## 2017-06-14 NOTE — ED Notes (Signed)
Patient requesting something for pain and nausea. EDP made aware. Verbal order obtained from Dr. Roderic Palau, States he is going to review patients results.

## 2017-06-14 NOTE — ED Notes (Signed)
Consulted Inpatient Cardiology per Dr. Roderic Palau. Dr. Domenic Polite given Dr. Ellsworth Lennox contact information.

## 2017-06-14 NOTE — ED Provider Notes (Signed)
[facility] DEPT Provider Note   CSN: [id number] Arrival date & time: 06/14/17  1300     History   Chief Complaint Chief Complaint  Patient presents with  . Chest Pain  . Flank Pain    HPI Summer Cook is a 51 y.o. female.  Patient complains of having chest discomfort off and on that is helped sometimes with nitroglycerin. This is been going on since she left the hospital approximately a week ago after she had a NSTEMI.  Patient had a catheterization showed a very small distal lesion that was going to be treated medically.   The history is provided by the patient.  Chest Pain   This is a recurrent problem. The current episode started more than 1 week ago. The problem occurs daily. The problem has not changed since onset.The pain is associated with movement. The pain is present in the substernal region. The pain is at a severity of 3/10. The pain is moderate. The quality of the pain is described as dull. Pertinent negatives include no abdominal pain, no back pain, no cough and no headaches.  Pertinent negatives for past medical history include no seizures.  Flank Pain  Associated symptoms include chest pain. Pertinent negatives include no abdominal pain and no headaches.    Past Medical History:  Diagnosis Date  . Allergy   . Anxiety   . Asthma   . CAD (coronary artery disease), native coronary artery    a. cath 05/20/17 - 100% distal PDA --> medical mangement (no intervention done)  . Candida esophagitis ([place])   . Chest pain at rest 04/2017  . Depression   . Essential hypertension, benign   . GERD (gastroesophageal reflux disease)   . Gout   . Hemorrhoids   . 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  Last was Prednisone 72m; 2 wk in 10/2012.  She also was on Prednisone bridged with Cellcept briefly but stopped due to lack of insurance.   . Myocardial  infarction ([facility]   . Pulmonary embolism ([facility] 04/2012  . Renal insufficiency   . Serrated adenoma of colon   . 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 Stroke ([facility]   . Thrush, oral 05/29/2011    Patient Active Problem List   Diagnosis Date Noted  . Unstable angina ([facility] 05/29/2017  . CAD (coronary artery disease): total distal RCA EF normal (05/20/17) 05/25/2017  . UTI (urinary tract infection) 05/25/2017  . Chest pain with moderate risk for cardiac etiology 05/23/2017  . Mixed hyperlipidemia   . NSTEMI (non-ST elevated myocardial infarction) ([facility] 05/20/2017  . Diabetes mellitus type 2, uncomplicated (HL[facility] [place] 049/67/5916 . History of stroke   . Chronic back pain 12/23/2015  . Hypotension 02/16/2015  . Asthma, chronic 02/16/2015  . Anxiety state 02/16/2015  . CKD (chronic kidney disease) stage 3, GFR 30-59 ml/min 04/28/2014  . Nausea with vomiting 01/25/2013  . Abnormal LFTs 01/25/2013  . Orthostatic hypotension 01/25/2013  . Rectal bleeding 12/26/2012  . History of pulmonary embolism 05/03/2012  . Hemorrhoids 06/04/2011  . 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 Immunosuppressed status ([facility] 06/04/2011  . Depression 06/04/2011  . Obesity 06/04/2011  . Idiopathic thrombocytopenic purpura ([facility] 03/18/2011    Past Surgical History:  Procedure Laterality Date  . BONE MARROW BIOPSY    . CARPAL TUNNEL RELEASE    . CARPAL TUNNEL RELEASE Left 05/20/2016   Procedure: CARPAL TUNNEL RELEASE;  Surgeon: SCarole Civil  MD;  Location: [facility];  Service: Orthopedics;  Laterality: Left;  . CARPAL TUNNEL RELEASE Right 07/16/2016   Procedure: RIGHT CARPAL TUNNEL RELEASE;  Surgeon: Carole Civil, MD;  Location: [facility];  Service: Orthopedics;  Laterality: Right;  . CATARACT EXTRACTION    . CHOLECYSTECTOMY  2008  . COLONOSCOPY WITH ESOPHAGOGASTRODUODENOSCOPY (EGD) N/A 01/04/2013   Dr. Gala Romney: esophageal plaques with +KOH, hh. Gastric antrum abnormal, bx reactive gastropathy. Anal canal hemorrhoids,  colonic tics, normal TI, single polyp (sessile serrated tubular adenoma). Next TCS 12/2017  . LEFT HEART CATH AND CORONARY ANGIOGRAPHY N/A 05/20/2017   Procedure: LEFT HEART CATH AND CORONARY ANGIOGRAPHY;  Surgeon: Martinique, Peter M, MD;  Location: [facility];  Service: Cardiovascular;  Laterality: N/A;  . 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 TEE WITHOUT CARDIOVERSION N/A 01/03/2016   Procedure: TRANSESOPHAGEAL ECHOCARDIOGRAM (TEE) WITH PROPOFOL;  Surgeon: Satira Sark, MD;  Location: [facility];  Service: Cardiovascular;  Laterality: N/A;    OB History    No data available       Home Medications    Prior to Admission medications   Medication Sig Start Date End Date Taking? Authorizing Provider  acetaminophen (TYLENOL) 325 MG tablet Take 2 tablets (650 mg total) by mouth every 6 (six) hours as needed for mild pain (or Fever >/= 101). Can use for chest pain. 06/06/17  Yes Debbe Odea, MD  ALPRAZolam Duanne Moron) 0.5 MG tablet Take 0.5 mg by mouth at bedtime. 05/28/17  Yes [provider]  aspirin EC 81 MG EC tablet Take 1 tablet (81 mg total) by mouth daily. 05/23/17  Yes Eileen Stanford, PA-C  atorvastatin (LIPITOR) 80 MG tablet Take 1 tablet (80 mg total) by mouth daily at 6 PM. Patient taking differently: Take 80 mg by mouth at bedtime.  05/22/17  Yes Eileen Stanford, PA-C  clopidogrel (PLAVIX) 75 MG tablet Take 1 tablet (75 mg total) by mouth daily. 05/23/17  Yes Eileen Stanford, PA-C  diclofenac sodium (VOLTAREN) 1 % GEL Apply 2 g topically 4 (four) times daily. 06/06/17  Yes Debbe Odea, MD  escitalopram (LEXAPRO) 20 MG tablet Take 20 mg by mouth daily.  05/03/17  Yes [provider]  ipratropium-albuterol (DUONEB) 0.5-2.5 (3) MG/3ML SOLN Take 3 mLs by nebulization 3 (three) times daily as needed (shorntess of breath/wheezing). Patient taking differently: Take 3 mLs by nebulization 3 (three) times daily as needed (shortness of breath/wheezing).  02/18/15  Yes  Kathie Dike, MD  isosorbide dinitrate (ISORDIL) 10 MG tablet Take 10 mg by mouth 2 (two) times daily.   Yes [provider]  Multiple Vitamins-Minerals (HAIR SKIN AND NAILS FORMULA PO) Take 1 tablet by mouth daily.   Yes [provider]  nitroGLYCERIN (NITROSTAT) 0.4 MG SL tablet Place 1 tablet (0.4 mg total) under the tongue every 5 (five) minutes x 3 doses as needed for chest pain. 05/25/17  Yes Carlota Raspberry, Tiffany, PA-C  PROAIR RESPICLICK 295 (90 BASE) MCG/ACT AEPB Inhale 2 puffs into the lungs every 4 (four) hours as needed (Shortness of breath).  12/27/14  Yes [provider]  cephALEXin (KEFLEX) 500 MG capsule Take 1 capsule (500 mg total) by mouth 4 (four) times daily. 06/14/17   Milton Ferguson, MD    Family History Family History  Problem Relation Age of Onset  . Cervical cancer Mother   . Lung cancer Father   . Colon polyps Father        ???  . Pneumonia  Brother   . Colon cancer Paternal Grandmother 88  . AAA (abdominal aortic aneurysm) Paternal Grandmother   . Breast cancer Paternal Grandmother   . Colon cancer Paternal Grandfather 88  . Barrett's esophagus Paternal Aunt   . Stomach cancer Neg Hx   . Pancreatic cancer Neg Hx     Social History Social History  Substance Use Topics  . Smoking status: Former Smoker    Years: 0.00    Types: Cigarettes    Quit date: 02/14/2009  . Smokeless tobacco: Never Used  . Alcohol use No     Allergies   Brintellix [vortioxetine]; Yellow jacket venom; Adhesive [tape]; and Zoloft [sertraline hcl]   Review of Systems Review of Systems  Constitutional: Negative for appetite change and fatigue.  HENT: Negative for congestion, ear discharge and sinus pressure.   Eyes: Negative for discharge.  Respiratory: Negative for cough.   Cardiovascular: Positive for chest pain.  Gastrointestinal: Negative for abdominal pain and diarrhea.  Genitourinary: Positive for flank pain. Negative for frequency and hematuria.    Musculoskeletal: Negative for back pain.  Skin: Negative for rash.  Neurological: Negative for seizures and headaches.  Psychiatric/Behavioral: Negative for hallucinations.     Physical Exam Updated Vital Signs BP 103/73   Pulse 68   Temp 98.2 F (36.8 C) (Oral)   Resp 13   Ht _0  (1.651 m)   Wt 83.9 kg (185 lb)   LMP 08/13/2011   SpO2 93%   BMI 30.79 kg/m   Physical Exam  Constitutional: She is oriented to person, place, and time. She appears well-developed.  HENT:  Head: Normocephalic.  Eyes: Conjunctivae and EOM are normal. No scleral icterus.  Neck: Neck supple. No thyromegaly present.  Cardiovascular: Normal rate and regular rhythm.  Exam reveals no gallop and no friction rub.   No murmur heard. Pulmonary/Chest: No stridor. She has no wheezes. She has no rales. She exhibits no tenderness.  Abdominal: She exhibits no distension. There is no tenderness. There is no rebound.  Musculoskeletal: Normal range of motion. She exhibits no edema.  Lymphadenopathy:    She has no cervical adenopathy.  Neurological: She is oriented to person, place, and time. She exhibits normal muscle tone. Coordination normal.  Skin: No rash noted. No erythema.  Psychiatric: She has a normal mood and affect. Her behavior is normal.     ED Treatments / Results  Labs (all labs ordered are listed, but only abnormal results are displayed) Labs Reviewed  BASIC METABOLIC PANEL - Abnormal; Notable for the following:       Result Value   BUN 27 (*)    Creatinine, Ser 1.73 (*)    GFR calc non Af Amer 33 (*)    GFR calc Af Amer 38 (*)    All other components within normal limits  URINALYSIS, ROUTINE W REFLEX MICROSCOPIC - Abnormal; Notable for the following:    APPearance HAZY (*)    Hgb urine dipstick SMALL (*)    Leukocytes, UA LARGE (*)    Bacteria, UA RARE (*)    Squamous Epithelial / LPF 0-5 (*)    All other components within normal limits  URINE CULTURE  CBC  TROPONIN I  HEPATIC  FUNCTION PANEL  D-DIMER, QUANTITATIVE (NOT AT [facility])  TROPONIN I    EKG  EKG Interpretation  Date/Time:  Monday June 14 2017 13:08:50 EDT Ventricular Rate:  79 PR Interval:  180 QRS Duration: 74 QT Interval:  434 QTC Calculation: 497 R Axis:  103 Text Interpretation:  Normal sinus rhythm with sinus arrhythmia Rightward axis Low voltage QRS Septal infarct , age undetermined Abnormal ECG Confirmed by Milton Ferguson [phone]) on 06/14/2017 3:20:20 PM       Radiology Dg Chest 2 View  Result Date: 06/14/2017 CLINICAL DATA:  chest pain that started yesterday with right arm pain/asthma/htn/ex smoker//hx heart cath EXAM: CHEST  2 VIEW COMPARISON:  06/05/2017 FINDINGS: The heart size and mediastinal contours are within normal limits. Both lungs are clear. No pleural effusion or pneumothorax. The visualized skeletal structures are unremarkable. IMPRESSION: No active cardiopulmonary disease. Electronically Signed   By: Lajean Manes M.D.   On: 06/14/2017 13:29   Ct Renal Stone Study  Result Date: 06/14/2017 CLINICAL DATA:  Lower right flank pain starting today. EXAM: CT ABDOMEN AND PELVIS WITHOUT CONTRAST TECHNIQUE: Multidetector CT imaging of the abdomen and pelvis was performed following the standard protocol without IV contrast. COMPARISON:  01/19/2015 FINDINGS: Lower chest:  Unremarkable. Hepatobiliary: Asymmetric enlargement left hepatic lobe, as before. Subtle nodularity of liver contour. Gallbladder surgically absent. No intrahepatic or extrahepatic biliary dilation. Pancreas: No focal mass lesion. No dilatation of the main duct. No intraparenchymal cyst. No peripancreatic edema. Spleen: Spleen surgically absent. Adrenals/Urinary Tract: No adrenal nodule or mass. Kidneys unremarkable. No evidence for hydroureter. The urinary bladder appears normal for the degree of distention. Stomach/Bowel: Stomach is nondistended. No gastric wall thickening. No evidence of outlet obstruction. Duodenum is  normally positioned as is the ligament of Treitz. No small bowel wall thickening. No small bowel dilatation. The terminal ileum is normal. The appendix is normal. Diverticular changes are noted in the left colon without evidence of diverticulitis. Vascular/Lymphatic: No abdominal aortic aneurysm. Left common iliac artery measures up to 1.7 cm diameter. There is no gastrohepatic or hepatoduodenal ligament lymphadenopathy. No intraperitoneal or retroperitoneal lymphadenopathy. No pelvic sidewall lymphadenopathy. Reproductive: The uterus has normal CT imaging appearance. There is no adnexal mass. Other: No intraperitoneal free fluid. Musculoskeletal: Bone windows reveal no worrisome lytic or sclerotic osseous lesions. Compression deformity at T10 and T12 is stable in the interval. IMPRESSION: 1. No acute findings in the abdomen or pelvis. 2. Asymmetric enlargement lateral segment left liver with subtle nodularity of hepatic contour. This appearance raises the question of cirrhosis. 3. Status post splenectomy Electronically Signed   By: Misty Stanley M.D.   On: 06/14/2017 17:23    Procedures Procedures (including critical care time)  Medications Ordered in ED Medications  cephALEXin (KEFLEX) capsule 500 mg (not administered)  ondansetron (ZOFRAN-ODT) disintegrating tablet 4 mg (4 mg Oral Given 06/14/17 1631)  oxyCODONE-acetaminophen (PERCOCET/ROXICET) 5-325 MG per tablet 1 tablet (1 tablet Oral Given 06/14/17 1732)     Initial Impression / Assessment and Plan / ED Course  I have reviewed the triage vital signs and the nursing notes.  Pertinent labs & imaging results that were available during my care of the patient were reviewed by me and considered in my medical decision making (see chart for details).     Patient has a urinary tract infection will be treated with Keflex. I'll address her labs were negative. I spoke with cardiology who suggested increasing her Isordil to 20 mg a day and have her  blood pressure checked soon. She may not be able to tolerate the increase the Isordil. She is to see her family doctor in a couple days  Final Clinical Impressions(s) / ED Diagnoses   Final diagnoses:  Lower urinary tract infectious disease    New Prescriptions New  Prescriptions   CEPHALEXIN (KEFLEX) 500 MG CAPSULE    Take 1 capsule (500 mg total) by mouth 4 (four) times daily.     Milton Ferguson, MD 06/14/17 2005

## 2017-06-14 NOTE — ED Notes (Signed)
Pt to CT at this time.

## 2017-06-15 DIAGNOSIS — N390 Urinary tract infection, site not specified: Secondary | ICD-10-CM | POA: Diagnosis not present

## 2017-06-15 DIAGNOSIS — R809 Proteinuria, unspecified: Secondary | ICD-10-CM | POA: Diagnosis not present

## 2017-06-15 DIAGNOSIS — N183 Chronic kidney disease, stage 3 (moderate): Secondary | ICD-10-CM | POA: Diagnosis not present

## 2017-06-15 DIAGNOSIS — I129 Hypertensive chronic kidney disease with stage 1 through stage 4 chronic kidney disease, or unspecified chronic kidney disease: Secondary | ICD-10-CM | POA: Diagnosis not present

## 2017-06-16 LAB — URINE CULTURE

## 2017-06-21 ENCOUNTER — Telehealth: Payer: Self-pay | Admitting: *Deleted

## 2017-06-21 DIAGNOSIS — R69 Illness, unspecified: Secondary | ICD-10-CM | POA: Diagnosis not present

## 2017-06-21 NOTE — Telephone Encounter (Signed)
-----   Message from Lendon Colonel, NP sent at 06/21/2017 12:00 PM EDT ----- Please order a cardiac rehab referral on this patient but have it be from Dr. Domenic Polite. He has to sign it to get it paid for before they can enroll this patient. Thank you.

## 2017-06-22 ENCOUNTER — Encounter: Payer: Self-pay | Admitting: *Deleted

## 2017-06-23 ENCOUNTER — Ambulatory Visit: Payer: Medicare HMO | Admitting: Physician Assistant

## 2017-07-04 ENCOUNTER — Emergency Department (HOSPITAL_COMMUNITY)
Admission: EM | Admit: 2017-07-04 | Discharge: 2017-07-04 | Disposition: A | Discharge status: Home / Self Care | Payer: Medicare HMO | Attending: Emergency Medicine | Admitting: Emergency Medicine

## 2017-07-04 ENCOUNTER — Encounter (HOSPITAL_COMMUNITY): Payer: Self-pay | Admitting: Emergency Medicine

## 2017-07-04 DIAGNOSIS — R112 Nausea with vomiting, unspecified: Secondary | ICD-10-CM | POA: Diagnosis present

## 2017-07-04 DIAGNOSIS — Z7982 Long term (current) use of aspirin: Secondary | ICD-10-CM | POA: Insufficient documentation

## 2017-07-04 DIAGNOSIS — J45909 Unspecified asthma, uncomplicated: Secondary | ICD-10-CM | POA: Insufficient documentation

## 2017-07-04 DIAGNOSIS — B349 Viral infection, unspecified: Secondary | ICD-10-CM | POA: Insufficient documentation

## 2017-07-04 DIAGNOSIS — Z8673 Personal history of transient ischemic attack (TIA), and cerebral infarction without residual deficits: Secondary | ICD-10-CM | POA: Insufficient documentation

## 2017-07-04 DIAGNOSIS — Z7902 Long term (current) use of antithrombotics/antiplatelets: Secondary | ICD-10-CM | POA: Insufficient documentation

## 2017-07-04 DIAGNOSIS — N183 Chronic kidney disease, stage 3 (moderate): Secondary | ICD-10-CM | POA: Insufficient documentation

## 2017-07-04 DIAGNOSIS — E119 Type 2 diabetes mellitus without complications: Secondary | ICD-10-CM | POA: Insufficient documentation

## 2017-07-04 DIAGNOSIS — I129 Hypertensive chronic kidney disease with stage 1 through stage 4 chronic kidney disease, or unspecified chronic kidney disease: Secondary | ICD-10-CM | POA: Insufficient documentation

## 2017-07-04 DIAGNOSIS — Z87891 Personal history of nicotine dependence: Secondary | ICD-10-CM | POA: Diagnosis not present

## 2017-07-04 DIAGNOSIS — R109 Unspecified abdominal pain: Secondary | ICD-10-CM | POA: Diagnosis not present

## 2017-07-04 DIAGNOSIS — I252 Old myocardial infarction: Secondary | ICD-10-CM | POA: Diagnosis not present

## 2017-07-04 DIAGNOSIS — I2510 Atherosclerotic heart disease of native coronary artery without angina pectoris: Secondary | ICD-10-CM | POA: Diagnosis not present

## 2017-07-04 LAB — BASIC METABOLIC PANEL
Anion gap: 7 (ref 5–15)
BUN: 24 mg/dL — AB (ref 6–20)
CO2: 27 mmol/L (ref 22–32)
CREATININE: 1.63 mg/dL — AB (ref 0.44–1.00)
Calcium: 8.4 mg/dL — ABNORMAL LOW (ref 8.9–10.3)
Chloride: 108 mmol/L (ref 101–111)
GFR calc Af Amer: 41 mL/min — ABNORMAL LOW (ref >60)
GFR calc non Af Amer: 36 mL/min — ABNORMAL LOW (ref >60)
Glucose, Bld: 78 mg/dL (ref 65–99)
POTASSIUM: 4.6 mmol/L (ref 3.5–5.1)
SODIUM: 142 mmol/L (ref 135–145)

## 2017-07-04 LAB — URINALYSIS, ROUTINE W REFLEX MICROSCOPIC
BILIRUBIN URINE: NEGATIVE
GLUCOSE, UA: NEGATIVE mg/dL
KETONES UR: NEGATIVE mg/dL
NITRITE: NEGATIVE
PROTEIN: NEGATIVE mg/dL
Specific Gravity, Urine: 1.012 (ref 1.005–1.030)
pH: 5 (ref 5.0–8.0)

## 2017-07-04 LAB — CBC WITH DIFFERENTIAL/PLATELET
Basophils Absolute: 0.1 10*3/uL (ref 0.0–0.1)
Basophils Relative: 1 %
EOS ABS: 0.3 10*3/uL (ref 0.0–0.7)
EOS PCT: 3 %
HCT: 37 % (ref 36.0–46.0)
Hemoglobin: 11.9 g/dL — ABNORMAL LOW (ref 12.0–15.0)
LYMPHS ABS: 4.4 10*3/uL — AB (ref 0.7–4.0)
Lymphocytes Relative: 50 %
MCH: 29.2 pg (ref 26.0–34.0)
MCHC: 32.2 g/dL (ref 30.0–36.0)
MCV: 90.9 fL (ref 78.0–100.0)
MONO ABS: 0.7 10*3/uL (ref 0.1–1.0)
Monocytes Relative: 8 %
Neutro Abs: 3.3 10*3/uL (ref 1.7–7.7)
Neutrophils Relative %: 38 %
PLATELETS: 157 10*3/uL (ref 150–400)
RBC: 4.07 MIL/uL (ref 3.87–5.11)
RDW: 14.5 % (ref 11.5–15.5)
WBC: 8.7 10*3/uL (ref 4.0–10.5)

## 2017-07-04 MED ORDER — ONDANSETRON HCL 4 MG PO TABS: 4.0000 mg | ORAL_TABLET | Freq: Four times a day (QID) | ORAL | 0 refills | Status: DC

## 2017-07-04 MED ORDER — HYDROCODONE-ACETAMINOPHEN 5-325 MG PO TABS: 1.0000 | ORAL_TABLET | ORAL | 0 refills | Status: DC | PRN

## 2017-07-04 MED ORDER — ONDANSETRON HCL 4 MG/2ML IJ SOLN
4.0000 mg | Freq: Once | INTRAMUSCULAR | Status: AC
  Administered 2017-07-04: 4 mg via INTRAVENOUS
  Filled 2017-07-04: qty 2

## 2017-07-04 MED ORDER — SODIUM CHLORIDE 0.9 % IV BOLUS (SEPSIS)
1000.0000 mL | Freq: Once | INTRAVENOUS | Status: AC
  Administered 2017-07-04: 1000 mL via INTRAVENOUS

## 2017-07-04 MED ORDER — FENTANYL CITRATE (PF) 100 MCG/2ML IJ SOLN
50.0000 ug | Freq: Once | INTRAMUSCULAR | Status: AC
Start: 2017-07-04 — End: 2017-07-04
  Administered 2017-07-04: 50 ug via INTRAVENOUS
  Filled 2017-07-04: qty 2

## 2017-07-04 MED ORDER — KETOROLAC TROMETHAMINE 30 MG/ML IJ SOLN
30.0000 mg | Freq: Once | INTRAMUSCULAR | Status: AC
  Administered 2017-07-04: 30 mg via INTRAVENOUS
  Filled 2017-07-04: qty 1

## 2017-07-04 NOTE — ED Provider Notes (Signed)
[facility] DEPT Provider Note   CSN: [id number] Arrival date & time: 07/04/17  1620     History   Chief Complaint Chief Complaint  Patient presents with  . Abdominal Pain    HPI Summer Cook is a 51 y.o. female.  Achiness, cough, congestion, nausea, vomiting, diarrhea, decreased appetite since receiving the flu shot a couple weeks ago. She is ambulatory. She has taken over-the-counter medications with minimal relief. No chest pain, dyspnea, fever, dysuria, stiff neck. She feels dehydrated. Severity of symptoms is moderate. Nothing makes symptoms better or worse.      Past Medical History:  Diagnosis Date  . Allergy   . Anxiety   . Asthma   . CAD (coronary artery disease), native coronary artery    a. cath 05/20/17 - 100% distal PDA --> medical mangement (no intervention done)  . Candida esophagitis ([place])   . Chest pain at rest 04/2017  . Depression   . Essential hypertension, benign   . GERD (gastroesophageal reflux disease)   . Gout   . Hemorrhoids   . 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  Last was Prednisone '40mg'$ ; 2 wk in 10/2012.  She also was on Prednisone bridged with Cellcept briefly but stopped due to lack of insurance.   . Myocardial infarction ([place])   . Pulmonary embolism ([place]) 04/2012  . Renal insufficiency   . Serrated adenoma of colon   . Steroid-induced diabetes ([place])   . Stroke ([place])   . Thrush, oral 05/29/2011    Patient Active Problem List   Diagnosis Date Noted  . Unstable angina ([place]) 05/29/2017  . CAD (coronary artery disease): total distal RCA EF normal (05/20/17) 05/25/2017  . UTI (urinary tract infection) 05/25/2017  . Chest pain with moderate risk for cardiac etiology 05/23/2017  . Mixed hyperlipidemia   . NSTEMI (non-ST elevated myocardial infarction) ([place]) 05/20/2017  . Diabetes mellitus type 2, uncomplicated ([place])  34/19/3790  . History of stroke   . Chronic back pain 12/23/2015  . Hypotension 02/16/2015  . Asthma, chronic 02/16/2015  . Anxiety state 02/16/2015  . CKD (chronic kidney disease) stage 3, GFR 30-59 ml/min (HCC) 04/28/2014  . Nausea with vomiting 01/25/2013  . Abnormal LFTs 01/25/2013  . Orthostatic hypotension 01/25/2013  . Rectal bleeding 12/26/2012  . History of pulmonary embolism 05/03/2012  . Hemorrhoids 06/04/2011  . 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 Immunosuppressed status ([place]) 06/04/2011  . Depression 06/04/2011  . Obesity 06/04/2011  . Idiopathic thrombocytopenic purpura ([place]) 03/18/2011    Past Surgical History:  Procedure Laterality Date  . BONE MARROW BIOPSY    . CARPAL TUNNEL RELEASE    . CARPAL TUNNEL RELEASE Left 05/20/2016   Procedure: CARPAL TUNNEL RELEASE;  Surgeon: Carole Civil, MD;  Location: [facility];  Service: Orthopedics;  Laterality: Left;  . CARPAL TUNNEL RELEASE Right 07/16/2016   Procedure: RIGHT CARPAL TUNNEL RELEASE;  Surgeon: Carole Civil, MD;  Location: [facility];  Service: Orthopedics;  Laterality: Right;  . CATARACT EXTRACTION    . CHOLECYSTECTOMY  2008  . COLONOSCOPY WITH ESOPHAGOGASTRODUODENOSCOPY (EGD) N/A 01/04/2013   Dr. Gala Romney: esophageal plaques with +KOH, hh. Gastric antrum abnormal, bx reactive gastropathy. Anal canal hemorrhoids, colonic tics, normal TI, single polyp (sessile serrated tubular adenoma). Next TCS 12/2017  . LEFT HEART CATH AND CORONARY ANGIOGRAPHY N/A 05/20/2017   Procedure: LEFT HEART CATH AND CORONARY ANGIOGRAPHY;  Surgeon: Martinique, Peter M, MD;  Location:  [facility];  Service: Cardiovascular;  Laterality: N/A;  . 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 TEE WITHOUT CARDIOVERSION N/A 01/03/2016   Procedure: TRANSESOPHAGEAL ECHOCARDIOGRAM (TEE) WITH PROPOFOL;  Surgeon: Satira Sark, MD;  Location: [facility];  Service: Cardiovascular;  Laterality: N/A;    OB History    No data available       Home Medications      Prior to Admission medications   Medication Sig Start Date End Date Taking? Authorizing Provider  acetaminophen (TYLENOL) 325 MG tablet Take 2 tablets (650 mg total) by mouth every 6 (six) hours as needed for mild pain (or Fever >/= 101). Can use for chest pain. 06/06/17   Debbe Odea, MD  ALPRAZolam Duanne Moron) 0.5 MG tablet Take 0.5 mg by mouth at bedtime. 05/28/17   [provider]  aspirin EC 81 MG EC tablet Take 1 tablet (81 mg total) by mouth daily. 05/23/17   Eileen Stanford, PA-C  atorvastatin (LIPITOR) 80 MG tablet Take 1 tablet (80 mg total) by mouth daily at 6 PM. Patient taking differently: Take 80 mg by mouth at bedtime.  05/22/17   Eileen Stanford, PA-C  cephALEXin (KEFLEX) 500 MG capsule Take 1 capsule (500 mg total) by mouth 4 (four) times daily. 06/14/17   Milton Ferguson, MD  clopidogrel (PLAVIX) 75 MG tablet Take 1 tablet (75 mg total) by mouth daily. 05/23/17   Eileen Stanford, PA-C  diclofenac sodium (VOLTAREN) 1 % GEL Apply 2 g topically 4 (four) times daily. 06/06/17   Debbe Odea, MD  escitalopram (LEXAPRO) 20 MG tablet Take 20 mg by mouth daily.  05/03/17   [provider]  HYDROcodone-acetaminophen (NORCO/VICODIN) 5-325 MG tablet Take 1 tablet by mouth every 4 (four) hours as needed. 07/04/17   [name], MD  ipratropium-albuterol (DUONEB) 0.5-2.5 (3) MG/3ML SOLN Take 3 mLs by nebulization 3 (three) times daily as needed (shorntess of breath/wheezing). Patient taking differently: Take 3 mLs by nebulization 3 (three) times daily as needed (shortness of breath/wheezing).  02/18/15   Kathie Dike, MD  isosorbide dinitrate (ISORDIL) 10 MG tablet Take 10 mg by mouth 2 (two) times daily.    [provider]  Multiple Vitamins-Minerals (HAIR SKIN AND NAILS FORMULA PO) Take 1 tablet by mouth daily.    [provider]  nitroGLYCERIN (NITROSTAT) 0.4 MG SL tablet Place 1 tablet (0.4 mg total) under the tongue every 5 (five) minutes x 3 doses as  needed for chest pain. 05/25/17   Delos Haring, PA-C  ondansetron (ZOFRAN) 4 MG tablet Take 1 tablet (4 mg total) by mouth every 6 (six) hours. 07/04/17   [name], MD  [phone] BASE) MCG/ACT AEPB Inhale 2 puffs into the lungs every 4 (four) hours as needed (Shortness of breath).  12/27/14   [provider]    Family History Family History  Problem Relation Age of Onset  . Cervical cancer Mother   . Lung cancer Father   . Colon polyps Father        ???  . Pneumonia Brother   . Colon cancer Paternal Grandmother 88  . AAA (abdominal aortic aneurysm) Paternal Grandmother   . Breast cancer Paternal Grandmother   . Colon cancer Paternal Grandfather 91  . Barrett's esophagus Paternal Aunt   . Stomach cancer Neg Hx   . Pancreatic cancer Neg Hx     Social History Social History  Substance Use Topics  . Smoking status: Former Smoker  Years: 0.00    Types: Cigarettes    Quit date: 02/14/2009  . Smokeless tobacco: Never Used  . Alcohol use No     Allergies   Brintellix [vortioxetine]; Yellow jacket venom; Adhesive [tape]; and Zoloft [sertraline hcl]   Review of Systems Review of Systems  All other systems reviewed and are negative.    Physical Exam Updated Vital Signs BP 110/88 (BP Location: Right Arm)   Pulse 95   Temp 98.5 F (36.9 C) (Oral)   Resp 18   Wt 83.9 kg (185 lb)   LMP 08/13/2011   SpO2 97%   BMI 30.79 kg/m   Physical Exam  Constitutional: She is oriented to person, place, and time.  Dehydrated; no acute distress  HENT:  Head: Normocephalic and atraumatic.  Eyes: Conjunctivae are normal.  Neck: Neck supple.  Cardiovascular: Normal rate and regular rhythm.   Pulmonary/Chest: Effort normal and breath sounds normal.  Abdominal: Soft. Bowel sounds are normal.  Musculoskeletal: Normal range of motion.  Neurological: She is alert and oriented to person, place, and time.  Skin: Skin is warm and dry.  pale  Psychiatric: She  has a normal mood and affect. Her behavior is normal.  Nursing note and vitals reviewed.    ED Treatments / Results  Labs (all labs ordered are listed, but only abnormal results are displayed) Labs Reviewed  CBC WITH DIFFERENTIAL/PLATELET - Abnormal; Notable for the following:       Result Value   Hemoglobin 11.9 (*)    Lymphs Abs 4.4 (*)    All other components within normal limits  BASIC METABOLIC PANEL - Abnormal; Notable for the following:    BUN 24 (*)    Creatinine, Ser 1.63 (*)    Calcium 8.4 (*)    GFR calc non Af Amer 36 (*)    GFR calc Af Amer 41 (*)    All other components within normal limits  URINALYSIS, ROUTINE W REFLEX MICROSCOPIC - Abnormal; Notable for the following:    Hgb urine dipstick SMALL (*)    Leukocytes, UA TRACE (*)    Bacteria, UA RARE (*)    Squamous Epithelial / LPF 0-5 (*)    All other components within normal limits    EKG  EKG Interpretation None       Radiology No results found.  Procedures Procedures (including critical care time)  Medications Ordered in ED Medications  sodium chloride 0.9 % bolus 1,000 mL (0 mLs Intravenous Stopped 07/04/17 1952)  ondansetron (ZOFRAN) injection 4 mg (4 mg Intravenous Given 07/04/17 1843)  sodium chloride 0.9 % bolus 1,000 mL (1,000 mLs Intravenous New Bag/Given 07/04/17 1952)  ketorolac (TORADOL) 30 MG/ML injection 30 mg (30 mg Intravenous Given 07/04/17 1923)  fentaNYL (SUBLIMAZE) injection 50 mcg (50 mcg Intravenous Given 07/04/17 2033)     Initial Impression / Assessment and Plan / ED Course  I have reviewed the triage vital signs and the nursing notes.  Pertinent labs & imaging results that were available during my care of the patient were reviewed by me and considered in my medical decision making (see chart for details).     History and physical consistent with viral syndrome. She feels much better after 2 liters of IV fluids. Discharge medications Zofran 4 mg and Vicodin  Final  Clinical Impressions(s) / ED Diagnoses   Final diagnoses:  Viral syndrome    New Prescriptions New Prescriptions   HYDROCODONE-ACETAMINOPHEN (NORCO/VICODIN) 5-325 MG TABLET    Take 1 tablet by  mouth every 4 (four) hours as needed.   ONDANSETRON (ZOFRAN) 4 MG TABLET    Take 1 tablet (4 mg total) by mouth every 6 (six) hours.     [name], MD 07/04/17 2045

## 2017-07-04 NOTE — ED Triage Notes (Signed)
Pt states that since Monday she has been congested, having body aches, cough, and been fatigued.  Pt states that she has been taking thera flu and it is not helping.  Pt states that she has been nauseous and having diarrhea with abdominal pain.

## 2017-07-04 NOTE — Discharge Instructions (Signed)
Increase fluids. Medication for pain and nausea. Follow-up your primary care doctor. Follow-up your primary care doctor

## 2017-07-12 ENCOUNTER — Emergency Department (HOSPITAL_COMMUNITY)
Admission: EM | Admit: 2017-07-12 | Discharge: 2017-07-12 | Disposition: A | Discharge status: Home / Self Care | Payer: Medicare HMO | Attending: Emergency Medicine | Admitting: Emergency Medicine

## 2017-07-12 ENCOUNTER — Encounter (HOSPITAL_COMMUNITY): Payer: Self-pay

## 2017-07-12 ENCOUNTER — Emergency Department (HOSPITAL_COMMUNITY): Payer: Medicare HMO

## 2017-07-12 ENCOUNTER — Other Ambulatory Visit: Payer: Self-pay

## 2017-07-12 DIAGNOSIS — N183 Chronic kidney disease, stage 3 unspecified: Secondary | ICD-10-CM

## 2017-07-12 DIAGNOSIS — Z79899 Other long term (current) drug therapy: Secondary | ICD-10-CM | POA: Diagnosis not present

## 2017-07-12 DIAGNOSIS — Z8673 Personal history of transient ischemic attack (TIA), and cerebral infarction without residual deficits: Secondary | ICD-10-CM | POA: Insufficient documentation

## 2017-07-12 DIAGNOSIS — R072 Precordial pain: Secondary | ICD-10-CM | POA: Diagnosis not present

## 2017-07-12 DIAGNOSIS — Z7902 Long term (current) use of antithrombotics/antiplatelets: Secondary | ICD-10-CM | POA: Insufficient documentation

## 2017-07-12 DIAGNOSIS — F419 Anxiety disorder, unspecified: Secondary | ICD-10-CM | POA: Insufficient documentation

## 2017-07-12 DIAGNOSIS — E1122 Type 2 diabetes mellitus with diabetic chronic kidney disease: Secondary | ICD-10-CM | POA: Insufficient documentation

## 2017-07-12 DIAGNOSIS — Z9049 Acquired absence of other specified parts of digestive tract: Secondary | ICD-10-CM | POA: Diagnosis not present

## 2017-07-12 DIAGNOSIS — J45909 Unspecified asthma, uncomplicated: Secondary | ICD-10-CM | POA: Diagnosis not present

## 2017-07-12 DIAGNOSIS — I2510 Atherosclerotic heart disease of native coronary artery without angina pectoris: Secondary | ICD-10-CM | POA: Insufficient documentation

## 2017-07-12 DIAGNOSIS — Z87891 Personal history of nicotine dependence: Secondary | ICD-10-CM | POA: Diagnosis not present

## 2017-07-12 DIAGNOSIS — F329 Major depressive disorder, single episode, unspecified: Secondary | ICD-10-CM | POA: Insufficient documentation

## 2017-07-12 DIAGNOSIS — R079 Chest pain, unspecified: Secondary | ICD-10-CM | POA: Diagnosis not present

## 2017-07-12 DIAGNOSIS — Z7982 Long term (current) use of aspirin: Secondary | ICD-10-CM | POA: Insufficient documentation

## 2017-07-12 DIAGNOSIS — I129 Hypertensive chronic kidney disease with stage 1 through stage 4 chronic kidney disease, or unspecified chronic kidney disease: Secondary | ICD-10-CM | POA: Insufficient documentation

## 2017-07-12 DIAGNOSIS — I252 Old myocardial infarction: Secondary | ICD-10-CM | POA: Diagnosis not present

## 2017-07-12 DIAGNOSIS — R69 Illness, unspecified: Secondary | ICD-10-CM | POA: Diagnosis not present

## 2017-07-12 LAB — CBC
HCT: 39.7 % (ref 36.0–46.0)
HEMOGLOBIN: 12.6 g/dL (ref 12.0–15.0)
MCH: 29.2 pg (ref 26.0–34.0)
MCHC: 31.7 g/dL (ref 30.0–36.0)
MCV: 91.9 fL (ref 78.0–100.0)
PLATELETS: 191 10*3/uL (ref 150–400)
RBC: 4.32 MIL/uL (ref 3.87–5.11)
RDW: 14.2 % (ref 11.5–15.5)
WBC: 7.7 10*3/uL (ref 4.0–10.5)

## 2017-07-12 LAB — TROPONIN I: Troponin I: <0.03 ng/mL (ref <0.03)

## 2017-07-12 LAB — BASIC METABOLIC PANEL
Anion gap: 9 (ref 5–15)
BUN: 33 mg/dL — ABNORMAL HIGH (ref 6–20)
CALCIUM: 9.4 mg/dL (ref 8.9–10.3)
CO2: 27 mmol/L (ref 22–32)
CREATININE: 1.75 mg/dL — AB (ref 0.44–1.00)
Chloride: 104 mmol/L (ref 101–111)
GFR calc Af Amer: 38 mL/min — ABNORMAL LOW (ref >60)
GFR calc non Af Amer: 33 mL/min — ABNORMAL LOW (ref >60)
GLUCOSE: 109 mg/dL — AB (ref 65–99)
Potassium: 5.3 mmol/L — ABNORMAL HIGH (ref 3.5–5.1)
Sodium: 140 mmol/L (ref 135–145)

## 2017-07-12 MED ORDER — MORPHINE SULFATE (PF) 2 MG/ML IV SOLN
2.0000 mg | Freq: Once | INTRAVENOUS | Status: AC
  Administered 2017-07-12: 2 mg via INTRAVENOUS
  Filled 2017-07-12: qty 1

## 2017-07-12 MED ORDER — ONDANSETRON HCL 4 MG/2ML IJ SOLN
4.0000 mg | Freq: Once | INTRAMUSCULAR | Status: AC
  Administered 2017-07-12: 4 mg via INTRAVENOUS
  Filled 2017-07-12: qty 2

## 2017-07-12 MED ORDER — HYDROMORPHONE HCL 1 MG/ML IJ SOLN
1.0000 mg | Freq: Once | INTRAMUSCULAR | Status: AC
  Administered 2017-07-12: 1 mg via INTRAVENOUS
  Filled 2017-07-12: qty 1

## 2017-07-12 MED ORDER — HYDROCODONE-ACETAMINOPHEN 5-325 MG PO TABS: 1.0000 | ORAL_TABLET | Freq: Four times a day (QID) | ORAL | 0 refills | Status: DC | PRN

## 2017-07-12 NOTE — Discharge Instructions (Signed)
Continue your current medications. Make an appointment to follow-up with her primary care doctor to have your kidney function test rechecked in about a week. Call cardiology for follow-up of the recurrent chest pain. Workup here today without evidence of any acute cardiac event. Return for any new or worse symptoms. Take the hydrocodone as needed for pain.

## 2017-07-12 NOTE — ED Notes (Addendum)
Pt has called out and stated pain medicine is not working and she wants something else. Dr. Rogene Houston notified. No new orders given at this time.

## 2017-07-12 NOTE — ED Triage Notes (Addendum)
Patient reports of left sided chest pain that started yesterday with nausea. Patient states she was recently seen here for body aches and still doesn't feel like she has recovered from that. Patient took 3 nitro around 1420 with no relief.

## 2017-07-12 NOTE — ED Provider Notes (Signed)
[facility] EMERGENCY DEPARTMENT Provider Note   CSN: [id number] Arrival date & time: 07/12/17  1543     History   Chief Complaint Chief Complaint  Patient presents with  . Chest Pain    HPI Summer Cook is a 51 y.o. female.  Patient presents with a complaint of chest pain.  Patient states left-sided chest pain started yesterday with nausea.  Patient was seen recently about a week ago for body aches still does not feel like she is recovered from that.  Patient took 3 nitro around 1430 with no relief.  Patient with a complaint that as if something was sitting on her chest.  Past medical history is significant for anxiety coronary artery disease with cath May 20, 2017.  100% distal PDA.  Medical management no intervention.  Patient also has history of pulmonary embolism.  Also past medical history significant for splenectomy.  Patient denied any significant shortness of breath.      Past Medical History:  Diagnosis Date  . Allergy   . Anxiety   . Asthma   . CAD (coronary artery disease), native coronary artery    a. cath 05/20/17 - 100% distal PDA --> medical mangement (no intervention done)  . Candida esophagitis ([place])   . Chest pain at rest 04/2017  . Depression   . Essential hypertension, benign   . GERD (gastroesophageal reflux disease)   . Gout   . Hemorrhoids   . 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  Last was Prednisone '40mg'$ ; 2 wk in 10/2012.  She also was on Prednisone bridged with Cellcept briefly but stopped due to lack of insurance.   . Myocardial infarction ([place])   . Pulmonary embolism ([place]) 04/2012  . Renal insufficiency   . Serrated adenoma of colon   . Steroid-induced diabetes ([place])   . Stroke ([place])   . Thrush, oral 05/29/2011    Patient Active Problem List   Diagnosis Date Noted  . Unstable angina ([place]) 05/29/2017  . CAD (coronary  artery disease): total distal RCA EF normal (05/20/17) 05/25/2017  . UTI (urinary tract infection) 05/25/2017  . Chest pain with moderate risk for cardiac etiology 05/23/2017  . Mixed hyperlipidemia   . NSTEMI (non-ST elevated myocardial infarction) ([place]) 05/20/2017  . Diabetes mellitus type 2, uncomplicated ([place]) 37/90/2409  . History of stroke   . Chronic back pain 12/23/2015  . Hypotension 02/16/2015  . Asthma, chronic 02/16/2015  . Anxiety state 02/16/2015  . CKD (chronic kidney disease) stage 3, GFR 30-59 ml/min (HCC) 04/28/2014  . Nausea with vomiting 01/25/2013  . Abnormal LFTs 01/25/2013  . Orthostatic hypotension 01/25/2013  . Rectal bleeding 12/26/2012  . History of pulmonary embolism 05/03/2012  . Hemorrhoids 06/04/2011  . 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 Immunosuppressed status ([place]) 06/04/2011  . Depression 06/04/2011  . Obesity 06/04/2011  . Idiopathic thrombocytopenic purpura ([place]) 03/18/2011    Past Surgical History:  Procedure Laterality Date  . BONE MARROW BIOPSY    . CARPAL TUNNEL RELEASE    . CARPAL TUNNEL RELEASE Left 05/20/2016   Procedure: CARPAL TUNNEL RELEASE;  Surgeon: Carole Civil, MD;  Location: [facility];  Service: Orthopedics;  Laterality: Left;  . CARPAL TUNNEL RELEASE Right 07/16/2016   Procedure: RIGHT CARPAL TUNNEL RELEASE;  Surgeon: Carole Civil, MD;  Location: [facility];  Service: Orthopedics;  Laterality: Right;  . CATARACT EXTRACTION    . CHOLECYSTECTOMY  2008  .  COLONOSCOPY WITH ESOPHAGOGASTRODUODENOSCOPY (EGD) N/A 01/04/2013   Dr. Gala Romney: esophageal plaques with +KOH, hh. Gastric antrum abnormal, bx reactive gastropathy. Anal canal hemorrhoids, colonic tics, normal TI, single polyp (sessile serrated tubular adenoma). Next TCS 12/2017  . LEFT HEART CATH AND CORONARY ANGIOGRAPHY N/A 05/20/2017   Procedure: LEFT HEART CATH AND CORONARY ANGIOGRAPHY;  Surgeon: Martinique, Peter M, MD;  Location: [facility] CV LAB;  Service: Cardiovascular;   Laterality: N/A;  . 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 TEE WITHOUT CARDIOVERSION N/A 01/03/2016   Procedure: TRANSESOPHAGEAL ECHOCARDIOGRAM (TEE) WITH PROPOFOL;  Surgeon: Satira Sark, MD;  Location: [facility];  Service: Cardiovascular;  Laterality: N/A;    OB History    No data available       Home Medications    Prior to Admission medications   Medication Sig Start Date End Date Taking? Authorizing Provider  acetaminophen (TYLENOL) 325 MG tablet Take 2 tablets (650 mg total) by mouth every 6 (six) hours as needed for mild pain (or Fever >/= 101). Can use for chest pain. 06/06/17   Debbe Odea, MD  ALPRAZolam Duanne Moron) 0.5 MG tablet Take 0.5 mg by mouth at bedtime. 05/28/17   [provider]  aspirin EC 81 MG EC tablet Take 1 tablet (81 mg total) by mouth daily. 05/23/17   Eileen Stanford, PA-C  atorvastatin (LIPITOR) 80 MG tablet Take 1 tablet (80 mg total) by mouth daily at 6 PM. Patient taking differently: Take 80 mg by mouth at bedtime.  05/22/17   Eileen Stanford, PA-C  cephALEXin (KEFLEX) 500 MG capsule Take 1 capsule (500 mg total) by mouth 4 (four) times daily. 06/14/17   Milton Ferguson, MD  clopidogrel (PLAVIX) 75 MG tablet Take 1 tablet (75 mg total) by mouth daily. 05/23/17   Eileen Stanford, PA-C  diclofenac sodium (VOLTAREN) 1 % GEL Apply 2 g topically 4 (four) times daily. 06/06/17   Debbe Odea, MD  escitalopram (LEXAPRO) 20 MG tablet Take 20 mg by mouth daily.  05/03/17   [provider]  HYDROcodone-acetaminophen (NORCO/VICODIN) 5-325 MG tablet Take 1 tablet by mouth every 4 (four) hours as needed. 07/04/17   [name], MD  HYDROcodone-acetaminophen (NORCO/VICODIN) 5-325 MG tablet Take 1-2 tablets by mouth every 6 (six) hours as needed. 07/12/17   [name], MD  ipratropium-albuterol (DUONEB) 0.5-2.5 (3) MG/3ML SOLN Take 3 mLs by nebulization 3 (three) times daily as needed (shorntess of breath/wheezing). Patient taking differently: Take 3  mLs by nebulization 3 (three) times daily as needed (shortness of breath/wheezing).  02/18/15   Kathie Dike, MD  isosorbide dinitrate (ISORDIL) 10 MG tablet Take 10 mg by mouth 2 (two) times daily.    [provider]  Multiple Vitamins-Minerals (HAIR SKIN AND NAILS FORMULA PO) Take 1 tablet by mouth daily.    [provider]  nitroGLYCERIN (NITROSTAT) 0.4 MG SL tablet Place 1 tablet (0.4 mg total) under the tongue every 5 (five) minutes x 3 doses as needed for chest pain. 05/25/17   Delos Haring, PA-C  ondansetron (ZOFRAN) 4 MG tablet Take 1 tablet (4 mg total) by mouth every 6 (six) hours. 07/04/17   [name], MD  [phone] BASE) MCG/ACT AEPB Inhale 2 puffs into the lungs every 4 (four) hours as needed (Shortness of breath).  12/27/14   [provider]    Family History Family History  Problem Relation Age of Onset  . Cervical cancer Mother   . Lung cancer Father   .  Colon polyps Father        ???  . Pneumonia Brother   . Colon cancer Paternal Grandmother 38  . AAA (abdominal aortic aneurysm) Paternal Grandmother   . Breast cancer Paternal Grandmother   . Colon cancer Paternal Grandfather 24  . Barrett's esophagus Paternal Aunt   . Stomach cancer Neg Hx   . Pancreatic cancer Neg Hx     Social History Social History  Substance Use Topics  . Smoking status: Former Smoker    Years: 0.00    Types: Cigarettes    Quit date: 02/14/2009  . Smokeless tobacco: Never Used  . Alcohol use No     Allergies   Brintellix [vortioxetine]; Yellow jacket venom; Adhesive [tape]; and Zoloft [sertraline hcl]   Review of Systems Review of Systems  Constitutional: Negative for fever.  HENT: Negative for congestion.   Eyes: Negative for visual disturbance.  Respiratory: Negative for shortness of breath.   Cardiovascular: Positive for chest pain.  Gastrointestinal: Negative for abdominal pain and vomiting.  Genitourinary: Negative for dysuria.    Musculoskeletal: Positive for myalgias.  Skin: Negative for rash.  Neurological: Negative for headaches.  Hematological: Does not bruise/bleed easily.  Psychiatric/Behavioral: Negative for confusion.     Physical Exam Updated Vital Signs BP 122/90   Pulse 67   Temp 98.6 F (37 C) (Oral)   Resp 17   Ht 1.651 m (5\' 5" )   Wt 83.9 kg (185 lb)   LMP 08/13/2011   SpO2 98%   BMI 30.79 kg/m   Physical Exam  Constitutional: She is oriented to person, place, and time. She appears well-developed and well-nourished. No distress.  HENT:  Head: Normocephalic and atraumatic.  Mouth/Throat: Oropharynx is clear and moist.  Eyes: Pupils are equal, round, and reactive to light. EOM are normal.  Neck: Normal range of motion. Neck supple.  Cardiovascular: Normal rate and regular rhythm.   Pulmonary/Chest: Effort normal and breath sounds normal.  Abdominal: Soft. Bowel sounds are normal. There is no tenderness.  Musculoskeletal: Normal range of motion. She exhibits no edema.  Neurological: She is alert and oriented to person, place, and time. No cranial nerve deficit or sensory deficit. She exhibits normal muscle tone. Coordination normal.  Skin: Skin is warm. No rash noted.  Nursing note and vitals reviewed.    ED Treatments / Results  Labs (all labs ordered are listed, but only abnormal results are displayed) Labs Reviewed  BASIC METABOLIC PANEL - Abnormal; Notable for the following:       Result Value   Potassium 5.3 (*)    Glucose, Bld 109 (*)    BUN 33 (*)    Creatinine, Ser 1.75 (*)    GFR calc non Af Amer 33 (*)    GFR calc Af Amer 38 (*)    All other components within normal limits  CBC  TROPONIN I  TROPONIN I    EKG  EKG Interpretation None     ED ECG REPORT   Date: 07/12/2017  Rate: 59  Rhythm: normal sinus rhythm  QRS Axis: normal  Intervals: normal  ST/T Wave abnormalities: nonspecific T wave changes  Conduction Disutrbances:none  Narrative  Interpretation:   Old EKG Reviewed: none available  I have personally reviewed the EKG tracing and agree with the computerized printout as noted. Shows evidence of anterior septal infarct age indeterminate. Patient does have a history of a non-STEMI.  Radiology Dg Chest 2 View  Result Date: 07/12/2017 CLINICAL DATA:  Central chest  pain for 2-3 days, stroke, hypertension, asthma, coronary disease post MI, former smoker, prior pulmonary embolism EXAM: CHEST  2 VIEW COMPARISON:  06/14/2017 FINDINGS: Upper-normal size of cardiac silhouette. Mediastinal contours and pulmonary vascularity normal. Lungs clear. No acute infiltrate, pleural effusion or pneumothorax. Bones demineralized. IMPRESSION: No acute abnormalities. Electronically Signed   By: Lavonia Dana M.D.   On: 07/12/2017 16:03    Procedures Procedures (including critical care time)  Medications Ordered in ED Medications  ondansetron [facility]) injection 4 mg (4 mg Intravenous Given 07/12/17 2020)  morphine 2 MG/ML injection 2 mg (2 mg Intravenous Given 07/12/17 2020)  HYDROmorphone (DILAUDID) injection 1 mg (1 mg Intravenous Given 07/12/17 2111)     Initial Impression / Assessment and Plan / ED Course  I have reviewed the triage vital signs and the nursing notes.  Pertinent labs & imaging results that were available during my care of the patient were reviewed by me and considered in my medical decision making (see chart for details).     Patient has had chest pain since yesterday.  Troponins have been negative x2.  Although patient does have cardiac risk factors.  Based on the duration of this pain would expect troponins to be elevated if this was unstable angina or an acute cardiac event.  Labs only significant for some elevation of potassium and creatinine.  Close follow-up of the potassium will be important.  Patient is followed by Dr. Riley Kill.  EKG without any acute changes.  Chest x-ray negative.  Although patient's had a history  of pulmonary embolus in the past.  Clinically unlikely to be the presentation this time.  Patient not tachycardic.  Oxygen saturations are 95% or better.  No pleuritic type chest pain.  Patient will return for any new or worse symptoms.  Patient will follow up with cardiology.  She is followed locally.  Also will follow up with her primary care doctor.  Patient stable for discharge home.     Final Clinical Impressions(s) / ED Diagnoses   Final diagnoses:  Precordial pain  Stage 3 chronic kidney disease (HCC)    New Prescriptions New Prescriptions   HYDROCODONE-ACETAMINOPHEN (NORCO/VICODIN) 5-325 MG TABLET    Take 1-2 tablets by mouth every 6 (six) hours as needed.     [name], MD 07/23/17 [phone]

## 2017-07-12 NOTE — ED Notes (Signed)
Pt c/o change in chest pain. States it feels like something is sitting on her chest now. Repeated EKG and notified Dr. Rogene Houston.

## 2017-07-28 NOTE — Progress Notes (Deleted)
Cardiology Office Note   Date:  07/28/2017   ID:  Summer Cook, DOB 1966/09/20, MRN [id number]  PCP:  Redmond School, MD  Cardiologist:  Rozann Lesches, MD  No chief complaint on file.     History of Present Illness: Summer Cook is a 51 y.o. female who presents for ongoing assessment and management of CAD of the PDA, hypertension, CVA with hx of PE, no anticoagulation due to history of ITP, s/p splenectomy.   She was last seen in the office on 06/11/2017 and was stable from cardiac standpoint, but had multiple somatic complaints.She complained of fatigue. Due to hypotension I stopped the low dose metoprolol and she was encouraged to become more active to relieve stress.    She was seen in the ER 3 days later with complains to chest pain. She was ruled out for ACS. She was treated for a UTI. Isordil was increased to 20 mg daily. Shew as to follow up with PCP. She was again seen in the ER with complaints of chest pain on 07/12/2017. She was diagnosed with anxiety, and ruled out for ACS.    Past Medical History:  Diagnosis Date  . Allergy   . Anxiety   . Asthma   . CAD (coronary artery disease), native coronary artery    a. cath 05/20/17 - 100% distal PDA --> medical mangement (no intervention done)  . Candida esophagitis ([place])   . Chest pain at rest 04/2017  . Depression   . Essential hypertension, benign   . GERD (gastroesophageal reflux disease)   . Gout   . Hemorrhoids   . 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  Last was Prednisone '40mg'$ ; 2 wk in 10/2012.  She also was on Prednisone bridged with Cellcept briefly but stopped due to lack of insurance.   . Myocardial infarction ([place])   . Pulmonary embolism ([place]) 04/2012  . Renal insufficiency   . Serrated adenoma of colon   . Steroid-induced diabetes ([place])   . Stroke ([place])   . Thrush, oral 05/29/2011    Past  Surgical History:  Procedure Laterality Date  . BONE MARROW BIOPSY    . CARPAL TUNNEL RELEASE    . CARPAL TUNNEL RELEASE Left 05/20/2016   Procedure: CARPAL TUNNEL RELEASE;  Surgeon: Carole Civil, MD;  Location: [facility];  Service: Orthopedics;  Laterality: Left;  . CARPAL TUNNEL RELEASE Right 07/16/2016   Procedure: RIGHT CARPAL TUNNEL RELEASE;  Surgeon: Carole Civil, MD;  Location: [facility];  Service: Orthopedics;  Laterality: Right;  . CATARACT EXTRACTION    . CHOLECYSTECTOMY  2008  . COLONOSCOPY WITH ESOPHAGOGASTRODUODENOSCOPY (EGD) N/A 01/04/2013   Dr. Gala Romney: esophageal plaques with +KOH, hh. Gastric antrum abnormal, bx reactive gastropathy. Anal canal hemorrhoids, colonic tics, normal TI, single polyp (sessile serrated tubular adenoma). Next TCS 12/2017  . LEFT HEART CATH AND CORONARY ANGIOGRAPHY N/A 05/20/2017   Procedure: LEFT HEART CATH AND CORONARY ANGIOGRAPHY;  Surgeon: Martinique, Peter M, MD;  Location: [facility];  Service: Cardiovascular;  Laterality: N/A;  . 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 TEE WITHOUT CARDIOVERSION N/A 01/03/2016   Procedure: TRANSESOPHAGEAL ECHOCARDIOGRAM (TEE) WITH PROPOFOL;  Surgeon: Satira Sark, MD;  Location: [facility];  Service: Cardiovascular;  Laterality: N/A;     Current Outpatient Prescriptions  Medication Sig Dispense Refill  . acetaminophen (TYLENOL) 325 MG tablet Take 2 tablets (650 mg total) by mouth  every 6 (six) hours as needed for mild pain (or Fever >/= 101). Can use for chest pain.    Marland Kitchen ALPRAZolam (XANAX) 0.5 MG tablet Take 0.5 mg by mouth at bedtime.    Marland Kitchen aspirin EC 81 MG EC tablet Take 1 tablet (81 mg total) by mouth daily.    Marland Kitchen atorvastatin (LIPITOR) 80 MG tablet Take 1 tablet (80 mg total) by mouth daily at 6 PM. (Patient taking differently: Take 80 mg by mouth at bedtime. ) 90 tablet 1  . cephALEXin (KEFLEX) 500 MG capsule Take 1 capsule (500 mg total) by mouth 4 (four) times daily. 28 capsule 0  . clopidogrel (PLAVIX)  75 MG tablet Take 1 tablet (75 mg total) by mouth daily. 90 tablet 0  . diclofenac sodium (VOLTAREN) 1 % GEL Apply 2 g topically 4 (four) times daily.    Marland Kitchen escitalopram (LEXAPRO) 20 MG tablet Take 20 mg by mouth daily.     Marland Kitchen HYDROcodone-acetaminophen (NORCO/VICODIN) 5-325 MG tablet Take 1 tablet by mouth every 4 (four) hours as needed. 10 tablet 0  . HYDROcodone-acetaminophen (NORCO/VICODIN) 5-325 MG tablet Take 1-2 tablets by mouth every 6 (six) hours as needed. 10 tablet 0  . ipratropium-albuterol (DUONEB) 0.5-2.5 (3) MG/3ML SOLN Take 3 mLs by nebulization 3 (three) times daily as needed (shorntess of breath/wheezing). (Patient taking differently: Take 3 mLs by nebulization 3 (three) times daily as needed (shortness of breath/wheezing). ) 360 mL 0  . isosorbide dinitrate (ISORDIL) 10 MG tablet Take 10 mg by mouth 2 (two) times daily.    . Multiple Vitamins-Minerals (HAIR SKIN AND NAILS FORMULA PO) Take 1 tablet by mouth daily.    . nitroGLYCERIN (NITROSTAT) 0.4 MG SL tablet Place 1 tablet (0.4 mg total) under the tongue every 5 (five) minutes x 3 doses as needed for chest pain. 25 tablet 12  . ondansetron (ZOFRAN) 4 MG tablet Take 1 tablet (4 mg total) by mouth every 6 (six) hours. 10 tablet 0  . PROAIR RESPICLICK 086 (90 BASE) MCG/ACT AEPB Inhale 2 puffs into the lungs every 4 (four) hours as needed (Shortness of breath).   3   No current facility-administered medications for this visit.     Allergies:   Brintellix [vortioxetine]; Yellow jacket venom; Adhesive [tape]; and Zoloft [sertraline hcl]    Social History:  The patient  reports that she quit smoking about 8 years ago. Her smoking use included Cigarettes. She quit after 0.00 years of use. She has never used smokeless tobacco. She reports that she does not drink alcohol or use drugs.   Family History:  The patient's family history includes AAA (abdominal aortic aneurysm) in her paternal grandmother; Barrett's esophagus in her paternal  aunt; Breast cancer in her paternal grandmother; Cervical cancer in her mother; Colon cancer (age of onset: 50) in her paternal grandfather; Colon cancer (age of onset: 57) in her paternal grandmother; Colon polyps in her father; Lung cancer in her father; Pneumonia in her brother.    ROS: All other systems are reviewed and negative. Unless otherwise mentioned in H&P    PHYSICAL EXAM: VS:  LMP 08/13/2011  , BMI There is no height or weight on file to calculate BMI. GEN: Well nourished, well developed, in no acute distress  HEENT: normal  Neck: no JVD, carotid bruits, or masses Cardiac: ***RRR; no murmurs, rubs, or gallops,no edema  Respiratory:  clear to auscultation bilaterally, normal work of breathing GI: soft, nontender, nondistended, + BS MS: no deformity or atrophy  Skin: warm and dry, no rash Neuro:  Strength and sensation are intact Psych: euthymic mood, full affect   EKG:  EKG {ACTION; IS/IS INO:[id number]} ordered today. The ekg ordered today demonstrates ***   Recent Labs: 05/23/2017: B Natriuretic Peptide 43.5 06/06/2017: Magnesium 2.0; TSH 2.861 06/14/2017: ALT 14 07/12/2017: BUN 33; Creatinine, Ser 1.75; Hemoglobin 12.6; Platelets 191; Potassium 5.3; Sodium 140    Lipid Panel    Component Value Date/Time   CHOL 229 (H) 05/19/2017 1912   TRIG 114 05/19/2017 1912   HDL 57 05/19/2017 1912   CHOLHDL 4.0 05/19/2017 1912   VLDL 23 05/19/2017 1912   LDLCALC 149 (H) 05/19/2017 1912      Wt Readings from Last 3 Encounters:  07/12/17 185 lb (83.9 kg)  07/04/17 185 lb (83.9 kg)  06/14/17 185 lb (83.9 kg)      Other studies Reviewed: Additional studies/ records that were reviewed today include: ***. Review of the above records demonstrates: ***   ASSESSMENT AND PLAN:  1.  ***   Current medicines are reviewed at length with the patient today.    Labs/ tests ordered today include: *** Phill Myron. [place] Pugh, ANP, AACC   07/28/2017 3:28 PM    [facility] [address] [place], [place] [address] Phone: [phone]; Fax: [phone]

## 2017-07-29 ENCOUNTER — Ambulatory Visit: Payer: Medicare HMO | Admitting: Adult Health

## 2017-07-29 ENCOUNTER — Encounter: Payer: Self-pay | Admitting: Adult Health

## 2017-07-30 ENCOUNTER — Encounter (HOSPITAL_COMMUNITY): Payer: Self-pay | Admitting: Emergency Medicine

## 2017-07-30 ENCOUNTER — Emergency Department (HOSPITAL_COMMUNITY)
Admission: EM | Admit: 2017-07-30 | Discharge: 2017-07-31 | Disposition: A | Discharge status: Home / Self Care | Payer: Medicare HMO | Attending: Emergency Medicine | Admitting: Emergency Medicine

## 2017-07-30 DIAGNOSIS — N183 Chronic kidney disease, stage 3 (moderate): Secondary | ICD-10-CM | POA: Insufficient documentation

## 2017-07-30 DIAGNOSIS — Z7982 Long term (current) use of aspirin: Secondary | ICD-10-CM | POA: Insufficient documentation

## 2017-07-30 DIAGNOSIS — E860 Dehydration: Secondary | ICD-10-CM

## 2017-07-30 DIAGNOSIS — Z79899 Other long term (current) drug therapy: Secondary | ICD-10-CM | POA: Insufficient documentation

## 2017-07-30 DIAGNOSIS — N179 Acute kidney failure, unspecified: Secondary | ICD-10-CM | POA: Diagnosis not present

## 2017-07-30 DIAGNOSIS — R110 Nausea: Secondary | ICD-10-CM | POA: Insufficient documentation

## 2017-07-30 DIAGNOSIS — J45909 Unspecified asthma, uncomplicated: Secondary | ICD-10-CM | POA: Insufficient documentation

## 2017-07-30 DIAGNOSIS — I129 Hypertensive chronic kidney disease with stage 1 through stage 4 chronic kidney disease, or unspecified chronic kidney disease: Secondary | ICD-10-CM | POA: Diagnosis not present

## 2017-07-30 DIAGNOSIS — Z87891 Personal history of nicotine dependence: Secondary | ICD-10-CM | POA: Insufficient documentation

## 2017-07-30 DIAGNOSIS — I2510 Atherosclerotic heart disease of native coronary artery without angina pectoris: Secondary | ICD-10-CM | POA: Diagnosis not present

## 2017-07-30 DIAGNOSIS — R197 Diarrhea, unspecified: Secondary | ICD-10-CM | POA: Diagnosis not present

## 2017-07-30 LAB — COMPREHENSIVE METABOLIC PANEL
ALK PHOS: 95 U/L (ref 38–126)
ALT: 24 U/L (ref 14–54)
ANION GAP: 13 (ref 5–15)
AST: 34 U/L (ref 15–41)
Albumin: 3.9 g/dL (ref 3.5–5.0)
BUN: 39 mg/dL — ABNORMAL HIGH (ref 6–20)
CALCIUM: 9.5 mg/dL (ref 8.9–10.3)
CO2: 21 mmol/L — ABNORMAL LOW (ref 22–32)
CREATININE: 2.76 mg/dL — AB (ref 0.44–1.00)
Chloride: 105 mmol/L (ref 101–111)
GFR, EST AFRICAN AMERICAN: 22 mL/min — AB (ref >60)
GFR, EST NON AFRICAN AMERICAN: 19 mL/min — AB (ref >60)
Glucose, Bld: 82 mg/dL (ref 65–99)
Potassium: 4.4 mmol/L (ref 3.5–5.1)
Sodium: 139 mmol/L (ref 135–145)
TOTAL PROTEIN: 7.3 g/dL (ref 6.5–8.1)
Total Bilirubin: 0.5 mg/dL (ref 0.3–1.2)

## 2017-07-30 LAB — CBC
HEMATOCRIT: 42.5 % (ref 36.0–46.0)
HEMOGLOBIN: 14.1 g/dL (ref 12.0–15.0)
MCH: 29.3 pg (ref 26.0–34.0)
MCHC: 33.2 g/dL (ref 30.0–36.0)
MCV: 88.4 fL (ref 78.0–100.0)
Platelets: 95 10*3/uL — ABNORMAL LOW (ref 150–400)
RBC: 4.81 MIL/uL (ref 3.87–5.11)
RDW: 13.9 % (ref 11.5–15.5)
WBC: 6.7 10*3/uL (ref 4.0–10.5)

## 2017-07-30 LAB — LIPASE, BLOOD: Lipase: 23 U/L (ref 11–51)

## 2017-07-30 LAB — BASIC METABOLIC PANEL
ANION GAP: 11 (ref 5–15)
BUN: 37 mg/dL — ABNORMAL HIGH (ref 6–20)
CHLORIDE: 109 mmol/L (ref 101–111)
CO2: 18 mmol/L — AB (ref 22–32)
Calcium: 8.2 mg/dL — ABNORMAL LOW (ref 8.9–10.3)
Creatinine, Ser: 2.29 mg/dL — ABNORMAL HIGH (ref 0.44–1.00)
GFR calc non Af Amer: 24 mL/min — ABNORMAL LOW (ref >60)
GFR, EST AFRICAN AMERICAN: 27 mL/min — AB (ref >60)
Glucose, Bld: 65 mg/dL (ref 65–99)
Potassium: 3.5 mmol/L (ref 3.5–5.1)
Sodium: 138 mmol/L (ref 135–145)

## 2017-07-30 MED ORDER — ACETAMINOPHEN 325 MG PO TABS
650.0000 mg | ORAL_TABLET | Freq: Once | ORAL | Status: AC
  Administered 2017-07-30: 650 mg via ORAL
  Filled 2017-07-30: qty 2

## 2017-07-30 MED ORDER — ONDANSETRON 4 MG PO TBDP
4.0000 mg | ORAL_TABLET | Freq: Once | ORAL | Status: DC | PRN
  Filled 2017-07-30: qty 1

## 2017-07-30 MED ORDER — SODIUM CHLORIDE 0.9 % IV BOLUS (SEPSIS)
1000.0000 mL | Freq: Once | INTRAVENOUS | Status: AC
Start: 2017-07-30 — End: 2017-07-30
  Administered 2017-07-30: 1000 mL via INTRAVENOUS

## 2017-07-30 MED ORDER — ONDANSETRON HCL 4 MG/2ML IJ SOLN
4.0000 mg | Freq: Once | INTRAMUSCULAR | Status: AC
  Administered 2017-07-30: 4 mg via INTRAVENOUS
  Filled 2017-07-30: qty 2

## 2017-07-30 MED ORDER — LOPERAMIDE HCL 2 MG PO CAPS: 2.0000 mg | ORAL_CAPSULE | Freq: Four times a day (QID) | ORAL | 0 refills | Status: DC | PRN

## 2017-07-30 MED ORDER — ONDANSETRON 8 MG PO TBDP
8.0000 mg | ORAL_TABLET | Freq: Three times a day (TID) | ORAL | 0 refills | Status: DC | PRN
Start: 2017-07-30 — End: 2017-11-27

## 2017-07-30 MED ORDER — SODIUM CHLORIDE 0.9 % IV BOLUS (SEPSIS)
1000.0000 mL | Freq: Once | INTRAVENOUS | Status: AC
  Administered 2017-07-30: 1000 mL via INTRAVENOUS

## 2017-07-30 NOTE — ED Triage Notes (Addendum)
Pt with shortness of breath and inability to eat x 3 days, also D  Has not seen PCP

## 2017-07-30 NOTE — ED Provider Notes (Signed)
[facility] EMERGENCY DEPARTMENT Provider Note   CSN: [id number] Arrival date & time: 07/30/17  1602     History   Chief Complaint Chief Complaint  Patient presents with  . Diarrhea    HPI Summer Cook is a 51 y.o. female.  HPI She presents to the emergency room for evaluation of diarrhea.  Patient states her symptoms started about 3 days ago.  She has had frequent loose watery stools.  Has had some nausea but denies any vomiting.  She felt feverish but has not measured any temperatures.  Patient's also had some aches in her legs.  Patient states whenever she tries to eat or drink anything it goes right through her.  She denies any recent travel.  She denies any recent antibiotic use. Past Medical History:  Diagnosis Date  . Allergy   . Anxiety   . Asthma   . CAD (coronary artery disease), native coronary artery    a. cath 05/20/17 - 100% distal PDA --> medical mangement (no intervention done)  . Candida esophagitis ([place])   . Chest pain at rest 04/2017  . Depression   . Essential hypertension, benign   . GERD (gastroesophageal reflux disease)   . Gout   . Hemorrhoids   . 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  Last was Prednisone '40mg'$ ; 2 wk in 10/2012.  She also was on Prednisone bridged with Cellcept briefly but stopped due to lack of insurance.   . Myocardial infarction ([place])   . Pulmonary embolism ([place]) 04/2012  . Renal insufficiency   . Serrated adenoma of colon   . Steroid-induced diabetes ([place])   . Stroke ([place])   . Thrush, oral 05/29/2011    Patient Active Problem List   Diagnosis Date Noted  . Unstable angina ([place]) 05/29/2017  . CAD (coronary artery disease): total distal RCA EF normal (05/20/17) 05/25/2017  . UTI (urinary tract infection) 05/25/2017  . Chest pain with moderate risk for cardiac etiology 05/23/2017  . Mixed hyperlipidemia   . NSTEMI  (non-ST elevated myocardial infarction) ([place]) 05/20/2017  . Diabetes mellitus type 2, uncomplicated ([place]) 25/95/6387  . History of stroke   . Chronic back pain 12/23/2015  . Hypotension 02/16/2015  . Asthma, chronic 02/16/2015  . Anxiety state 02/16/2015  . CKD (chronic kidney disease) stage 3, GFR 30-59 ml/min (HCC) 04/28/2014  . Nausea with vomiting 01/25/2013  . Abnormal LFTs 01/25/2013  . Orthostatic hypotension 01/25/2013  . Rectal bleeding 12/26/2012  . History of pulmonary embolism 05/03/2012  . Hemorrhoids 06/04/2011  . 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 Immunosuppressed status ([place]) 06/04/2011  . Depression 06/04/2011  . Obesity 06/04/2011  . Idiopathic thrombocytopenic purpura ([place]) 03/18/2011    Past Surgical History:  Procedure Laterality Date  . BONE MARROW BIOPSY    . CARPAL TUNNEL RELEASE    . CARPAL TUNNEL RELEASE Left 05/20/2016   Procedure: CARPAL TUNNEL RELEASE;  Surgeon: Carole Civil, MD;  Location: [facility];  Service: Orthopedics;  Laterality: Left;  . CARPAL TUNNEL RELEASE Right 07/16/2016   Procedure: RIGHT CARPAL TUNNEL RELEASE;  Surgeon: Carole Civil, MD;  Location: [facility];  Service: Orthopedics;  Laterality: Right;  . CATARACT EXTRACTION    . CHOLECYSTECTOMY  2008  . COLONOSCOPY WITH ESOPHAGOGASTRODUODENOSCOPY (EGD) N/A 01/04/2013   Dr. Gala Romney: esophageal plaques with +KOH, hh. Gastric antrum abnormal, bx reactive gastropathy. Anal canal hemorrhoids, colonic tics, normal TI, single polyp (sessile serrated  tubular adenoma). Next TCS 12/2017  . LEFT HEART CATH AND CORONARY ANGIOGRAPHY N/A 05/20/2017   Procedure: LEFT HEART CATH AND CORONARY ANGIOGRAPHY;  Surgeon: Martinique, Peter M, MD;  Location: [place] [facility];  Service: Cardiovascular;  Laterality: N/A;  . 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 TEE WITHOUT CARDIOVERSION N/A 01/03/2016   Procedure: TRANSESOPHAGEAL ECHOCARDIOGRAM (TEE) WITH PROPOFOL;  Surgeon: Satira Sark, MD;  [facility];   Service: Cardiovascular;  Laterality: N/A;    OB History    No data available       Home Medications    Prior to Admission medications   Medication Sig Start Date End Date Taking? Authorizing Provider  acetaminophen (TYLENOL) 325 MG tablet Take 2 tablets (650 mg total) by mouth every 6 (six) hours as needed for mild pain (or Fever >/= 101). Can use for chest pain. 06/06/17   Debbe Odea, MD  ALPRAZolam Duanne Moron) 0.5 MG tablet Take 0.5 mg by mouth at bedtime. 05/28/17   [provider]  aspirin EC 81 MG EC tablet Take 1 tablet (81 mg total) by mouth daily. 05/23/17   Eileen Stanford, PA-C  atorvastatin (LIPITOR) 80 MG tablet Take 1 tablet (80 mg total) by mouth daily at 6 PM. Patient taking differently: Take 80 mg by mouth at bedtime.  05/22/17   Eileen Stanford, PA-C  cephALEXin (KEFLEX) 500 MG capsule Take 1 capsule (500 mg total) by mouth 4 (four) times daily. 06/14/17   Milton Ferguson, MD  clopidogrel (PLAVIX) 75 MG tablet Take 1 tablet (75 mg total) by mouth daily. 05/23/17   Eileen Stanford, PA-C  diclofenac sodium (VOLTAREN) 1 % GEL Apply 2 g topically 4 (four) times daily. 06/06/17   Debbe Odea, MD  escitalopram (LEXAPRO) 20 MG tablet Take 20 mg by mouth daily.  05/03/17   [provider]  HYDROcodone-acetaminophen (NORCO/VICODIN) 5-325 MG tablet Take 1 tablet by mouth every 4 (four) hours as needed. 07/04/17   [name], MD  HYDROcodone-acetaminophen (NORCO/VICODIN) 5-325 MG tablet Take 1-2 tablets by mouth every 6 (six) hours as needed. 07/12/17   [name], MD  ipratropium-albuterol (DUONEB) 0.5-2.5 (3) MG/3ML SOLN Take 3 mLs by nebulization 3 (three) times daily as needed (shorntess of breath/wheezing). Patient taking differently: Take 3 mLs by nebulization 3 (three) times daily as needed (shortness of breath/wheezing).  02/18/15   Kathie Dike, MD  isosorbide dinitrate (ISORDIL) 10 MG tablet Take 10 mg by mouth 2 (two) times daily.    [provider]  loperamide (IMODIUM) 2 MG capsule Take 1 capsule (2 mg total) by mouth 4 (four) times daily as needed for diarrhea or loose stools. 07/30/17   Dorie Rank, MD  Multiple Vitamins-Minerals (HAIR SKIN AND NAILS FORMULA PO) Take 1 tablet by mouth daily.    [provider]  nitroGLYCERIN (NITROSTAT) 0.4 MG SL tablet Place 1 tablet (0.4 mg total) under the tongue every 5 (five) minutes x 3 doses as needed for chest pain. 05/25/17   Delos Haring, PA-C  ondansetron (ZOFRAN ODT) 8 MG disintegrating tablet Take 1 tablet (8 mg total) by mouth every 8 (eight) hours as needed for nausea or vomiting. 07/30/17   Dorie Rank, MD  ondansetron (ZOFRAN) 4 MG tablet Take 1 tablet (4 mg total) by mouth every 6 (six) hours. 07/04/17   [name], MD  PROAIR RESPICLICK [phone] [id number] BASE) MCG/ACT AEPB Inhale 2 puffs into the lungs every 4 (four) hours as needed (Shortness of breath).  12/27/14   [provider]    Family History Family History  Problem Relation Age of Onset  . Cervical cancer Mother   . Lung cancer Father   . Colon polyps Father        ???  . Pneumonia Brother   . Colon cancer Paternal Grandmother 88  . AAA (abdominal aortic aneurysm) Paternal Grandmother   . Breast cancer Paternal Grandmother   . Colon cancer Paternal Grandfather 30  . Barrett's esophagus Paternal Aunt   . Stomach cancer Neg Hx   . Pancreatic cancer Neg Hx     Social History Social History  Substance Use Topics  . Smoking status: Former Smoker    Years: 0.00    Types: Cigarettes    Quit date: 02/14/2009  . Smokeless tobacco: Never Used  . Alcohol use No     Allergies   Brintellix [vortioxetine]; Yellow jacket venom; Adhesive [tape]; and Zoloft [sertraline hcl]   Review of Systems Review of Systems  All other systems reviewed and are negative.    Physical Exam Updated Vital Signs BP (!) 100/97 (BP Location: Left Arm)   Pulse 88   Temp 98.8 F (37.1 C) (Oral)   Resp 18   Ht  1.651 m ('5\' 5"'$ )   Wt 83.9 kg (185 lb)   LMP 08/13/2011   SpO2 100%   BMI 30.79 kg/m   Physical Exam  Constitutional: She appears well-developed and well-nourished. She appears distressed.  HENT:  Head: Normocephalic and atraumatic.  Right Ear: External ear normal.  Left Ear: External ear normal.  Mucous membranes dry  Eyes: Conjunctivae are normal. Right eye exhibits no discharge. Left eye exhibits no discharge. No scleral icterus.  Neck: Neck supple. No tracheal deviation present.  Cardiovascular: Normal rate, regular rhythm and intact distal pulses.   Pulmonary/Chest: Effort normal and breath sounds normal. No stridor. No respiratory distress. She has no wheezes. She has no rales.  Abdominal: Soft. Bowel sounds are normal. She exhibits no distension. There is no tenderness. There is no rebound and no guarding.  Musculoskeletal: She exhibits no edema or tenderness.  Neurological: She is alert. She has normal strength. No cranial nerve deficit (no facial droop, extraocular movements intact, no slurred speech) or sensory deficit. She exhibits normal muscle tone. She displays no seizure activity. Coordination normal.  Skin: Skin is warm and dry. No rash noted. She is not diaphoretic.  Psychiatric: She has a normal mood and affect.  Nursing note and vitals reviewed.    ED Treatments / Results  Labs (all labs ordered are listed, but only abnormal results are displayed) Labs Reviewed  COMPREHENSIVE METABOLIC PANEL - Abnormal; Notable for the following:       Result Value   CO2 21 (*)    BUN 39 (*)    Creatinine, Ser 2.76 (*)    GFR calc non Af Amer 19 (*)    GFR calc Af Amer 22 (*)    All other components within normal limits  CBC - Abnormal; Notable for the following:    Platelets 95 (*)    All other components within normal limits  BASIC METABOLIC PANEL - Abnormal; Notable for the following:    CO2 18 (*)    BUN 37 (*)    Creatinine, Ser 2.29 (*)    Calcium 8.2 (*)    GFR  calc non Af Amer 24 (*)    GFR calc Af Amer 27 (*)    All other components within normal  limits  LIPASE, BLOOD    EKG  EKG Interpretation None       Radiology No results found.  Procedures Procedures (including critical care time)  Medications Ordered in ED Medications  ondansetron (ZOFRAN-ODT) disintegrating tablet 4 mg (not administered)  sodium chloride 0.9 % bolus 1,000 mL (0 mLs Intravenous Stopped 07/30/17 2234)  ondansetron (ZOFRAN) injection 4 mg (4 mg Intravenous Given 07/30/17 1956)  sodium chloride 0.9 % bolus 1,000 mL (0 mLs Intravenous Stopped 07/30/17 2234)  acetaminophen (TYLENOL) tablet 650 mg (650 mg Oral Given 07/30/17 2232)     Initial Impression / Assessment and Plan / ED Course  I have reviewed the triage vital signs and the nursing notes.  Pertinent labs & imaging results that were available during my care of the patient were reviewed by me and considered in my medical decision making (see chart for details).   Patient presented to the emergency room with complaints of nausea and diarrhea.  Patient had numerous episodes of diarrhea over the last several days.  She has had nausea but no vomiting.  In the ED she had no abdominal tenderness.  While she was in the emergency room she had no further episodes of diarrhea and she was able to tolerate fluids.  Patient's laboratory tests were notable for an elevated BUN and creatinine.  Her creatinine was up to 2.76.  2 weeks ago was 1.75.  Patient was hydrated with IV fluids.  Her creatinine improved to 2.29 on a repeat basic metabolic panel.  Since the patient was not having any more episodes of diarrhea and she was not nauseated and she is able to tolerate oral fluids I think it is reasonable for her to go home.  Patient will continue to drink plenty of fluids.  I will have her come back to the emergency room on Sunday to have her BUN and creatinine rechecked.   Final Clinical Impressions(s) / ED Diagnoses   Final  diagnoses:  Diarrhea of presumed infectious origin  Nausea  Dehydration  Acute kidney injury (HCC)    New Prescriptions New Prescriptions   LOPERAMIDE (IMODIUM) 2 MG CAPSULE    Take 1 capsule (2 mg total) by mouth 4 (four) times daily as needed for diarrhea or loose stools.   ONDANSETRON (ZOFRAN ODT) 8 MG DISINTEGRATING TABLET    Take 1 tablet (8 mg total) by mouth every 8 (eight) hours as needed for nausea or vomiting.     Dorie Rank, MD 07/31/17 Dyann Kief

## 2017-07-30 NOTE — ED Triage Notes (Signed)
Patient c/o diarrhea x3 days with watery stools. Patient reports nausea but denies any vomiting. Unsure of any fevers but states "It felt like I had one last night but I didn't check it." Patient states hips and legs ache bilaterally. Denies any urinary symptoms or abd pain.

## 2017-07-31 NOTE — Discharge Instructions (Signed)
Drink plenty of fluids.  Take the Imodium and antinausea medications to help with your symptoms.  Return to the emergency room Sunday morning to have your blood tests rechecked.  Return sooner if you start having a lot of diarrhea again and feel like you are getting dehydrated

## 2017-08-01 ENCOUNTER — Emergency Department (HOSPITAL_COMMUNITY)
Admission: EM | Admit: 2017-08-01 | Discharge: 2017-08-01 | Disposition: A | Discharge status: Home / Self Care | Payer: Medicare HMO | Attending: Emergency Medicine | Admitting: Emergency Medicine

## 2017-08-01 ENCOUNTER — Encounter (HOSPITAL_COMMUNITY): Payer: Self-pay | Admitting: Emergency Medicine

## 2017-08-01 ENCOUNTER — Other Ambulatory Visit: Payer: Self-pay

## 2017-08-01 DIAGNOSIS — R197 Diarrhea, unspecified: Secondary | ICD-10-CM

## 2017-08-01 DIAGNOSIS — Z79899 Other long term (current) drug therapy: Secondary | ICD-10-CM | POA: Diagnosis not present

## 2017-08-01 DIAGNOSIS — R4182 Altered mental status, unspecified: Secondary | ICD-10-CM | POA: Diagnosis present

## 2017-08-01 DIAGNOSIS — Z7982 Long term (current) use of aspirin: Secondary | ICD-10-CM | POA: Diagnosis not present

## 2017-08-01 DIAGNOSIS — I259 Chronic ischemic heart disease, unspecified: Secondary | ICD-10-CM | POA: Diagnosis not present

## 2017-08-01 DIAGNOSIS — I129 Hypertensive chronic kidney disease with stage 1 through stage 4 chronic kidney disease, or unspecified chronic kidney disease: Secondary | ICD-10-CM | POA: Diagnosis not present

## 2017-08-01 DIAGNOSIS — J45909 Unspecified asthma, uncomplicated: Secondary | ICD-10-CM | POA: Insufficient documentation

## 2017-08-01 DIAGNOSIS — Z7902 Long term (current) use of antithrombotics/antiplatelets: Secondary | ICD-10-CM | POA: Insufficient documentation

## 2017-08-01 DIAGNOSIS — E098 Drug or chemical induced diabetes mellitus with unspecified complications: Secondary | ICD-10-CM | POA: Diagnosis not present

## 2017-08-01 DIAGNOSIS — Z87891 Personal history of nicotine dependence: Secondary | ICD-10-CM | POA: Insufficient documentation

## 2017-08-01 DIAGNOSIS — N289 Disorder of kidney and ureter, unspecified: Secondary | ICD-10-CM

## 2017-08-01 DIAGNOSIS — N183 Chronic kidney disease, stage 3 (moderate): Secondary | ICD-10-CM | POA: Insufficient documentation

## 2017-08-01 DIAGNOSIS — N189 Chronic kidney disease, unspecified: Secondary | ICD-10-CM | POA: Insufficient documentation

## 2017-08-01 DIAGNOSIS — I252 Old myocardial infarction: Secondary | ICD-10-CM | POA: Diagnosis not present

## 2017-08-01 LAB — CBC
HEMATOCRIT: 37.9 % (ref 36.0–46.0)
Hemoglobin: 12.4 g/dL (ref 12.0–15.0)
MCH: 29 pg (ref 26.0–34.0)
MCHC: 32.7 g/dL (ref 30.0–36.0)
MCV: 88.8 fL (ref 78.0–100.0)
Platelets: 93 10*3/uL — ABNORMAL LOW (ref 150–400)
RBC: 4.27 MIL/uL (ref 3.87–5.11)
RDW: 14 % (ref 11.5–15.5)
WBC: 5.8 10*3/uL (ref 4.0–10.5)

## 2017-08-01 LAB — URINALYSIS, ROUTINE W REFLEX MICROSCOPIC
BACTERIA UA: NONE SEEN
Bilirubin Urine: NEGATIVE
Glucose, UA: NEGATIVE mg/dL
Ketones, ur: NEGATIVE mg/dL
Leukocytes, UA: NEGATIVE
NITRITE: NEGATIVE
PH: 5 (ref 5.0–8.0)
Protein, ur: NEGATIVE mg/dL
SPECIFIC GRAVITY, URINE: 1.004 — AB (ref 1.005–1.030)

## 2017-08-01 LAB — COMPREHENSIVE METABOLIC PANEL
ALBUMIN: 3.2 g/dL — AB (ref 3.5–5.0)
ALK PHOS: 80 U/L (ref 38–126)
ALT: 19 U/L (ref 14–54)
AST: 28 U/L (ref 15–41)
Anion gap: 8 (ref 5–15)
BILIRUBIN TOTAL: 0.5 mg/dL (ref 0.3–1.2)
BUN: 29 mg/dL — AB (ref 6–20)
CO2: 22 mmol/L (ref 22–32)
Calcium: 8.6 mg/dL — ABNORMAL LOW (ref 8.9–10.3)
Chloride: 108 mmol/L (ref 101–111)
Creatinine, Ser: 2.02 mg/dL — ABNORMAL HIGH (ref 0.44–1.00)
GFR calc Af Amer: 32 mL/min — ABNORMAL LOW (ref >60)
GFR calc non Af Amer: 27 mL/min — ABNORMAL LOW (ref >60)
GLUCOSE: 98 mg/dL (ref 65–99)
POTASSIUM: 3.6 mmol/L (ref 3.5–5.1)
SODIUM: 138 mmol/L (ref 135–145)
TOTAL PROTEIN: 6.2 g/dL — AB (ref 6.5–8.1)

## 2017-08-01 MED ORDER — ACETAMINOPHEN 500 MG PO TABS
1000.0000 mg | ORAL_TABLET | Freq: Once | ORAL | Status: AC
  Administered 2017-08-01: 1000 mg via ORAL
  Filled 2017-08-01: qty 2

## 2017-08-01 MED ORDER — SODIUM CHLORIDE 0.9 % IV BOLUS (SEPSIS)
1000.0000 mL | Freq: Once | INTRAVENOUS | Status: AC
  Administered 2017-08-01: 1000 mL via INTRAVENOUS

## 2017-08-01 NOTE — Discharge Instructions (Signed)
Your kidney function is improving.  Continue to push fluids to stay hydrated.  Continue Lomotil as needed for diarrhea.  Tylenol only for body aches and back pain. Follow-up with [name] with ongoing symptoms

## 2017-08-01 NOTE — ED Triage Notes (Signed)
Pt reports was seen for same on Thursday and was told to come back today to have kidney functions re-assessed. Pt family also reports pt woke up with confusion and headache. LKW 1030 last night. Pt alert and oriented to self and place.

## 2017-08-01 NOTE — ED Provider Notes (Signed)
[facility] EMERGENCY DEPARTMENT Provider Note   CSN: [id number] Arrival date & time: 08/01/17  2595     History   Chief Complaint Chief Complaint  Patient presents with  . Altered Mental Status    HPI Summer Cook is a 51 y.o. female. Chief complaint recheck kidney function. Recent diarrhea  HPI:  [age] female. Seen and evaluated here on Tuesday with 2 days of diarrhea. Felt weak and lightheaded. Kidney function showed elevation of creatinine at 2.7. Baseline is 1.7. After hydration is improved 2.3. She was discharged home. Asked to hydrate herself with electrolyte solution. She been drinking Gatorade and keeping things down. After filling her prescription midday Friday she's had no additional diarrhea. Normal urine. Not straw-colored. She complains of mild diffuse bodyaches and chronic back pain.  No blood in her diarrhea. No bloody emesis. No fevers.  Past Medical History:  Diagnosis Date  . Allergy   . Anxiety   . Asthma   . CAD (coronary artery disease), native coronary artery    a. cath 05/20/17 - 100% distal PDA --> medical mangement (no intervention done)  . Candida esophagitis ([place])   . Chest pain at rest 04/2017  . Depression   . Essential hypertension, benign   . GERD (gastroesophageal reflux disease)   . Gout   . Hemorrhoids   . 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  Last was Prednisone '40mg'$ ; 2 wk in 10/2012.  She also was on Prednisone bridged with Cellcept briefly but stopped due to lack of insurance.   . Myocardial infarction ([place])   . Pulmonary embolism ([place]) 04/2012  . Renal insufficiency   . Serrated adenoma of colon   . Steroid-induced diabetes ([place])   . Stroke ([place])   . Thrush, oral 05/29/2011    Patient Active Problem List   Diagnosis Date Noted  . Unstable angina ([place]) 05/29/2017  . CAD (coronary artery disease): total distal RCA EF  normal (05/20/17) 05/25/2017  . UTI (urinary tract infection) 05/25/2017  . Chest pain with moderate risk for cardiac etiology 05/23/2017  . Mixed hyperlipidemia   . NSTEMI (non-ST elevated myocardial infarction) ([place]) 05/20/2017  . Diabetes mellitus type 2, uncomplicated ([place]) 63/87/5643  . History of stroke   . Chronic back pain 12/23/2015  . Hypotension 02/16/2015  . Asthma, chronic 02/16/2015  . Anxiety state 02/16/2015  . CKD (chronic kidney disease) stage 3, GFR 30-59 ml/min (HCC) 04/28/2014  . Nausea with vomiting 01/25/2013  . Abnormal LFTs 01/25/2013  . Orthostatic hypotension 01/25/2013  . Rectal bleeding 12/26/2012  . History of pulmonary embolism 05/03/2012  . Hemorrhoids 06/04/2011  . 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 Immunosuppressed status ([place]) 06/04/2011  . Depression 06/04/2011  . Obesity 06/04/2011  . Idiopathic thrombocytopenic purpura ([place]) 03/18/2011    Past Surgical History:  Procedure Laterality Date  . BONE MARROW BIOPSY    . CARPAL TUNNEL RELEASE    . CATARACT EXTRACTION    . CHOLECYSTECTOMY  2008  . SPLENECTOMY, TOTAL  01/2011    OB History    No data available       Home Medications    Prior to Admission medications   Medication Sig Start Date End Date Taking? Authorizing Provider  acetaminophen (TYLENOL) 325 MG tablet Take 2 tablets (650 mg total) by mouth every 6 (six) hours as needed for mild pain (or Fever >/= 101). Can use for chest pain. 06/06/17  Debbe Odea, MD  ALPRAZolam Duanne Moron) 0.5 MG tablet Take 0.5 mg by mouth at bedtime. 05/28/17   [provider]  aspirin EC 81 MG EC tablet Take 1 tablet (81 mg total) by mouth daily. 05/23/17   Eileen Stanford, PA-C  atorvastatin (LIPITOR) 80 MG tablet Take 1 tablet (80 mg total) by mouth daily at 6 PM. Patient taking differently: Take 80 mg by mouth at bedtime.  05/22/17   Eileen Stanford, PA-C  cephALEXin (KEFLEX) 500 MG capsule Take 1 capsule (500 mg total) by mouth 4 (four)  times daily. 06/14/17   Milton Ferguson, MD  clopidogrel (PLAVIX) 75 MG tablet Take 1 tablet (75 mg total) by mouth daily. 05/23/17   Eileen Stanford, PA-C  diclofenac sodium (VOLTAREN) 1 % GEL Apply 2 g topically 4 (four) times daily. 06/06/17   Debbe Odea, MD  escitalopram (LEXAPRO) 20 MG tablet Take 20 mg by mouth daily.  05/03/17   [provider]  HYDROcodone-acetaminophen (NORCO/VICODIN) 5-325 MG tablet Take 1 tablet by mouth every 4 (four) hours as needed. 07/04/17   [name], MD  HYDROcodone-acetaminophen (NORCO/VICODIN) 5-325 MG tablet Take 1-2 tablets by mouth every 6 (six) hours as needed. 07/12/17   [name], MD  ipratropium-albuterol (DUONEB) 0.5-2.5 (3) MG/3ML SOLN Take 3 mLs by nebulization 3 (three) times daily as needed (shorntess of breath/wheezing). Patient taking differently: Take 3 mLs by nebulization 3 (three) times daily as needed (shortness of breath/wheezing).  02/18/15   Kathie Dike, MD  isosorbide dinitrate (ISORDIL) 10 MG tablet Take 10 mg by mouth 2 (two) times daily.    [provider]  loperamide (IMODIUM) 2 MG capsule Take 1 capsule (2 mg total) by mouth 4 (four) times daily as needed for diarrhea or loose stools. 07/30/17   Dorie Rank, MD  Multiple Vitamins-Minerals (HAIR SKIN AND NAILS FORMULA PO) Take 1 tablet by mouth daily.    [provider]  nitroGLYCERIN (NITROSTAT) 0.4 MG SL tablet Place 1 tablet (0.4 mg total) under the tongue every 5 (five) minutes x 3 doses as needed for chest pain. 05/25/17   Delos Haring, PA-C  ondansetron (ZOFRAN ODT) 8 MG disintegrating tablet Take 1 tablet (8 mg total) by mouth every 8 (eight) hours as needed for nausea or vomiting. 07/30/17   Dorie Rank, MD  ondansetron (ZOFRAN) 4 MG tablet Take 1 tablet (4 mg total) by mouth every 6 (six) hours. 07/04/17   [name], MD  PROAIR RESPICLICK [phone] BASE) MCG/ACT AEPB Inhale 2 puffs into the lungs every 4 (four) hours as needed (Shortness of  breath).  12/27/14   [provider]    Family History Family History  Problem Relation Age of Onset  . Cervical cancer Mother   . Lung cancer Father   . Colon polyps Father        ???  . Pneumonia Brother   . Colon cancer Paternal Grandmother 88  . AAA (abdominal aortic aneurysm) Paternal Grandmother   . Breast cancer Paternal Grandmother   . Colon cancer Paternal Grandfather 42  . Barrett's esophagus Paternal Aunt   . Stomach cancer Neg Hx   . Pancreatic cancer Neg Hx     Social History Social History   Tobacco Use  . Smoking status: Former Smoker    Years: 0.00    Types: Cigarettes    Last attempt to quit: 02/14/2009    Years since quitting: 8.4  . Smokeless tobacco: Never Used  Substance Use Topics  .  Alcohol use: No    Alcohol/week: 0.0 oz  . Drug use: No     Allergies   Brintellix [vortioxetine]; Yellow jacket venom; Adhesive [tape]; and Zoloft [sertraline hcl]   Review of Systems Review of Systems  Constitutional: Negative for appetite change, chills, diaphoresis, fatigue and fever.  HENT: Negative for mouth sores, sore throat and trouble swallowing.   Eyes: Negative for visual disturbance.  Respiratory: Negative for cough, chest tightness, shortness of breath and wheezing.   Cardiovascular: Negative for chest pain.  Gastrointestinal: Negative for abdominal distention, abdominal pain, diarrhea, nausea and vomiting.  Endocrine: Negative for polydipsia, polyphagia and polyuria.  Genitourinary: Negative for dysuria, frequency and hematuria.  Musculoskeletal: Positive for myalgias. Negative for gait problem.  Skin: Negative for color change, pallor and rash.  Neurological: Positive for weakness. Negative for dizziness, syncope, light-headedness and headaches.  Hematological: Does not bruise/bleed easily.  Psychiatric/Behavioral: Negative for behavioral problems and confusion.     Physical Exam Updated Vital Signs BP 97/71   Pulse 64   Temp (!)  97.5 F (36.4 C) (Oral)   Resp 20   Ht '5\' 5"'$  (1.651 m)   Wt 83.9 kg (185 lb)   LMP 08/13/2011   SpO2 93%   BMI 30.79 kg/m   Physical Exam  Constitutional: She is oriented to person, place, and time. She appears well-developed and well-nourished. No distress.  Awake. Alert. Oriented lucid. Offers details of her own history.  HENT:  Head: Normocephalic.  Eyes: Conjunctivae are normal. Pupils are equal, round, and reactive to light. No scleral icterus.  Neck: Normal range of motion. Neck supple. No thyromegaly present.  Cardiovascular: Normal rate and regular rhythm. Exam reveals no gallop and no friction rub.  No murmur heard. Pulmonary/Chest: Effort normal and breath sounds normal. No respiratory distress. She has no wheezes. She has no rales.  Abdominal: Soft. Bowel sounds are normal. She exhibits no distension. There is no tenderness. There is no rebound.  Musculoskeletal: Normal range of motion.  Neurological: She is alert and oriented to person, place, and time.  Laboratory. Regional chin these. No deficits on exam.  Skin: Skin is warm and dry. No rash noted.  Psychiatric: She has a normal mood and affect. Her behavior is normal.     ED Treatments / Results  Labs (all labs ordered are listed, but only abnormal results are displayed) Labs Reviewed  COMPREHENSIVE METABOLIC PANEL - Abnormal; Notable for the following components:      Result Value   BUN 29 (*)    Creatinine, Ser 2.02 (*)    Calcium 8.6 (*)    Total Protein 6.2 (*)    Albumin 3.2 (*)    GFR calc non Af Amer 27 (*)    GFR calc Af Amer 32 (*)    All other components within normal limits  CBC - Abnormal; Notable for the following components:   Platelets 93 (*)    All other components within normal limits  URINALYSIS, ROUTINE W REFLEX MICROSCOPIC - Abnormal; Notable for the following components:   Color, Urine STRAW (*)    Specific Gravity, Urine 1.004 (*)    Hgb urine dipstick SMALL (*)    Squamous  Epithelial / LPF 0-5 (*)    All other components within normal limits    EKG  EKG Interpretation None       Radiology No results found.  Procedures Procedures (including critical care time)  Medications Ordered in ED Medications  acetaminophen (TYLENOL) tablet 1,000 mg (  not administered)  sodium chloride 0.9 % bolus 1,000 mL (1,000 mLs Intravenous New Bag/Given 08/01/17 1015)     Initial Impression / Assessment and Plan / ED Course  I have reviewed the triage vital signs and the nursing notes.  Pertinent labs & imaging results that were available during my care of the patient were reviewed by me and considered in my medical decision making (see chart for details).     Patient given additional IV fluids here. His urine is dilute. Creatinine is decreased at 2. Trace let and urine. That straw-colored. Not dark urine. Doubt rhabdo. Offered Tylenol for her body aches. Told to continue to hydrate herself orally at home. Follow-up with primary care not improving.  Final Clinical Impressions(s) / ED Diagnoses   Final diagnoses:  Diarrhea, unspecified type  Renal insufficiency    New Prescriptions This SmartLink is deprecated. Use AVSMEDLIST instead to display the medication list for a patient.   Tanna Furry, MD 08/01/17 1044

## 2017-08-06 DIAGNOSIS — R197 Diarrhea, unspecified: Secondary | ICD-10-CM | POA: Diagnosis not present

## 2017-08-06 DIAGNOSIS — J1089 Influenza due to other identified influenza virus with other manifestations: Secondary | ICD-10-CM | POA: Diagnosis not present

## 2017-08-06 DIAGNOSIS — M1990 Unspecified osteoarthritis, unspecified site: Secondary | ICD-10-CM | POA: Diagnosis not present

## 2017-08-06 DIAGNOSIS — K529 Noninfective gastroenteritis and colitis, unspecified: Secondary | ICD-10-CM | POA: Diagnosis not present

## 2017-08-06 DIAGNOSIS — Z79899 Other long term (current) drug therapy: Secondary | ICD-10-CM | POA: Diagnosis not present

## 2017-08-06 DIAGNOSIS — N289 Disorder of kidney and ureter, unspecified: Secondary | ICD-10-CM | POA: Diagnosis not present

## 2017-08-06 DIAGNOSIS — Z87891 Personal history of nicotine dependence: Secondary | ICD-10-CM | POA: Diagnosis not present

## 2017-08-06 DIAGNOSIS — Z7902 Long term (current) use of antithrombotics/antiplatelets: Secondary | ICD-10-CM | POA: Diagnosis not present

## 2017-08-06 DIAGNOSIS — J111 Influenza due to unidentified influenza virus with other respiratory manifestations: Secondary | ICD-10-CM | POA: Diagnosis not present

## 2017-08-06 DIAGNOSIS — I2510 Atherosclerotic heart disease of native coronary artery without angina pectoris: Secondary | ICD-10-CM | POA: Diagnosis not present

## 2017-08-10 DIAGNOSIS — N184 Chronic kidney disease, stage 4 (severe): Secondary | ICD-10-CM | POA: Diagnosis not present

## 2017-08-10 DIAGNOSIS — E860 Dehydration: Secondary | ICD-10-CM | POA: Diagnosis not present

## 2017-08-10 DIAGNOSIS — Z1389 Encounter for screening for other disorder: Secondary | ICD-10-CM | POA: Diagnosis not present

## 2017-08-10 DIAGNOSIS — E669 Obesity, unspecified: Secondary | ICD-10-CM | POA: Diagnosis not present

## 2017-08-10 DIAGNOSIS — I2510 Atherosclerotic heart disease of native coronary artery without angina pectoris: Secondary | ICD-10-CM | POA: Diagnosis not present

## 2017-08-10 DIAGNOSIS — Z6830 Body mass index (BMI) 30.0-30.9, adult: Secondary | ICD-10-CM | POA: Diagnosis not present

## 2017-08-10 DIAGNOSIS — R69 Illness, unspecified: Secondary | ICD-10-CM | POA: Diagnosis not present

## 2017-08-13 ENCOUNTER — Encounter (HOSPITAL_COMMUNITY): Payer: Self-pay

## 2017-08-13 ENCOUNTER — Emergency Department (HOSPITAL_COMMUNITY)
Admission: EM | Admit: 2017-08-13 | Discharge: 2017-08-13 | Disposition: A | Discharge status: Home / Self Care | Payer: Medicare HMO | Attending: Emergency Medicine | Admitting: Emergency Medicine

## 2017-08-13 DIAGNOSIS — Z7902 Long term (current) use of antithrombotics/antiplatelets: Secondary | ICD-10-CM | POA: Diagnosis not present

## 2017-08-13 DIAGNOSIS — Z79899 Other long term (current) drug therapy: Secondary | ICD-10-CM | POA: Insufficient documentation

## 2017-08-13 DIAGNOSIS — Z87891 Personal history of nicotine dependence: Secondary | ICD-10-CM | POA: Diagnosis not present

## 2017-08-13 DIAGNOSIS — D693 Immune thrombocytopenic purpura: Secondary | ICD-10-CM | POA: Insufficient documentation

## 2017-08-13 DIAGNOSIS — E119 Type 2 diabetes mellitus without complications: Secondary | ICD-10-CM | POA: Diagnosis not present

## 2017-08-13 DIAGNOSIS — W19XXXA Unspecified fall, initial encounter: Secondary | ICD-10-CM | POA: Insufficient documentation

## 2017-08-13 DIAGNOSIS — Y929 Unspecified place or not applicable: Secondary | ICD-10-CM | POA: Diagnosis not present

## 2017-08-13 DIAGNOSIS — S8011XA Contusion of right lower leg, initial encounter: Secondary | ICD-10-CM | POA: Diagnosis not present

## 2017-08-13 DIAGNOSIS — I259 Chronic ischemic heart disease, unspecified: Secondary | ICD-10-CM | POA: Insufficient documentation

## 2017-08-13 DIAGNOSIS — N183 Chronic kidney disease, stage 3 (moderate): Secondary | ICD-10-CM | POA: Diagnosis not present

## 2017-08-13 DIAGNOSIS — T148XXA Other injury of unspecified body region, initial encounter: Secondary | ICD-10-CM

## 2017-08-13 DIAGNOSIS — Y939 Activity, unspecified: Secondary | ICD-10-CM | POA: Insufficient documentation

## 2017-08-13 DIAGNOSIS — Z7982 Long term (current) use of aspirin: Secondary | ICD-10-CM | POA: Diagnosis not present

## 2017-08-13 DIAGNOSIS — I129 Hypertensive chronic kidney disease with stage 1 through stage 4 chronic kidney disease, or unspecified chronic kidney disease: Secondary | ICD-10-CM | POA: Diagnosis not present

## 2017-08-13 DIAGNOSIS — Y999 Unspecified external cause status: Secondary | ICD-10-CM | POA: Insufficient documentation

## 2017-08-13 DIAGNOSIS — M7981 Nontraumatic hematoma of soft tissue: Secondary | ICD-10-CM | POA: Diagnosis not present

## 2017-08-13 NOTE — Discharge Instructions (Signed)
Elevate your leg when possible.  Warm wet compresses.  Follow-up with your doctor or return here if needed.

## 2017-08-13 NOTE — ED Triage Notes (Signed)
Pt reports bruise to r shin x 2 days.  Pt unsure how she got bruised.  Says only hurts when her leg is dangling.

## 2017-08-14 NOTE — ED Provider Notes (Signed)
[facility] EMERGENCY DEPARTMENT Provider Note   CSN: [id number] Arrival date & time: 08/13/17  1409     History   Chief Complaint Chief Complaint  Patient presents with  . bruise    HPI Summer Cook is a 51 y.o. female.  HPI  Summer Cook is a 51 y.o. female with hx of ITP that is (currently in remission) and also takes plavix, who presents to the Emergency Department requesting evaluation of a bruise to the front of her right lower leg.  States it has been present for 2 days.  Unsure how the bruise occurred, but does admit to a fall few days ago. States the area is non-tender with weight bearing or foot movement.  States that she contacted her PCP's office and was told to come to ER for evaluation.    Past Medical History:  Diagnosis Date  . Allergy   . Anxiety   . Asthma   . CAD (coronary artery disease), native coronary artery    a. cath 05/20/17 - 100% distal PDA --> medical mangement (no intervention done)  . Candida esophagitis ([place])   . Chest pain at rest 04/2017  . Depression   . Essential hypertension, benign   . GERD (gastroesophageal reflux disease)   . Gout   . Hemorrhoids   . 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  Last was Prednisone 36m; 2 wk in 10/2012.  She also was on Prednisone bridged with Cellcept briefly but stopped due to lack of insurance.   . Myocardial infarction ([place]   . Pulmonary embolism ([place] 04/2012  . Renal insufficiency   . Serrated adenoma of colon   . Steroid-induced diabetes ([place]   . Stroke ([place]   . Thrush, oral 05/29/2011    Patient Active Problem List   Diagnosis Date Noted  . Unstable angina ([place] 05/29/2017  . CAD (coronary artery disease): total distal RCA EF normal (05/20/17) 05/25/2017  . UTI (urinary tract infection) 05/25/2017  . Chest pain with moderate risk for cardiac etiology 05/23/2017  . Mixed  hyperlipidemia   . NSTEMI (non-ST elevated myocardial infarction) ([place] 05/20/2017  . Diabetes mellitus type 2, uncomplicated ([place] 001/65/5374 . History of stroke   . Chronic back pain 12/23/2015  . Hypotension 02/16/2015  . Asthma, chronic 02/16/2015  . Anxiety state 02/16/2015  . CKD (chronic kidney disease) stage 3, GFR 30-59 ml/min (HCC) 04/28/2014  . Nausea with vomiting 01/25/2013  . Abnormal LFTs 01/25/2013  . Orthostatic hypotension 01/25/2013  . Rectal bleeding 12/26/2012  . History of pulmonary embolism 05/03/2012  . Hemorrhoids 06/04/2011  . 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 Immunosuppressed status ([place] 06/04/2011  . Depression 06/04/2011  . Obesity 06/04/2011  . Idiopathic thrombocytopenic purpura ([place] 03/18/2011    Past Surgical History:  Procedure Laterality Date  . BONE MARROW BIOPSY    . CARPAL TUNNEL RELEASE    . CARPAL TUNNEL RELEASE Left 05/20/2016   Performed by HCarole Civil MD at [facility]  . CATARACT EXTRACTION    . CHOLECYSTECTOMY  2008  . COLONOSCOPY WITH ESOPHAGOGASTRODUODENOSCOPY (EGD) N/A 01/04/2013   [facility]il [facility]olin [facility] . LEFT HEART CATH AND CORONARY ANGIOGRAPHY N/A 05/20/2017   [facility] M[facility]  . RIGHT CARPAL TUNNEL RELEASE Right 07/16/2016   Performed by HCarole Civil MD at [facility]  .  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 TRANSESOPHAGEAL ECHOCARDIOGRAM (TEE) WITH PROPOFOL N/A 01/03/2016   Performed by Satira Sark, MD at [facility]    OB History    No data available       Home Medications    Prior to Admission medications   Medication Sig Start Date End Date Taking? Authorizing Provider  acetaminophen (TYLENOL) 325 MG tablet Take 2 tablets (650 mg total) by mouth every 6 (six) hours as needed for mild pain (or Fever >/= 101). Can use for chest pain. 06/06/17   Debbe Odea, MD  ALPRAZolam Duanne Moron) 0.5 MG tablet Take 0.5 mg by mouth at bedtime. 05/28/17   [provider]  aspirin EC 81 MG EC tablet Take 1 tablet (81 mg total) by mouth daily. 05/23/17   Eileen Stanford, PA-C  atorvastatin (LIPITOR) 80 MG tablet Take 1 tablet (80 mg total) by mouth daily at 6 PM. Patient taking differently: Take 80 mg by mouth at bedtime.  05/22/17   Eileen Stanford, PA-C  cephALEXin (KEFLEX) 500 MG capsule Take 1 capsule (500 mg total) by mouth 4 (four) times daily. 06/14/17   Milton Ferguson, MD  clopidogrel (PLAVIX) 75 MG tablet Take 1 tablet (75 mg total) by mouth daily. 05/23/17   Eileen Stanford, PA-C  diclofenac sodium (VOLTAREN) 1 % GEL Apply 2 g topically 4 (four) times daily. 06/06/17   Debbe Odea, MD  escitalopram (LEXAPRO) 20 MG tablet Take 20 mg by mouth daily.  05/03/17   [provider]  HYDROcodone-acetaminophen (NORCO/VICODIN) 5-325 MG tablet Take 1 tablet by mouth every 4 (four) hours as needed. 07/04/17   [name], MD  HYDROcodone-acetaminophen (NORCO/VICODIN) 5-325 MG tablet Take 1-2 tablets by mouth every 6 (six) hours as needed. 07/12/17   [name], MD  ipratropium-albuterol (DUONEB) 0.5-2.5 (3) MG/3ML SOLN Take 3 mLs by nebulization 3 (three) times daily as needed (shorntess of breath/wheezing). Patient taking differently: Take 3 mLs by nebulization 3 (three) times daily as needed (shortness of breath/wheezing).  02/18/15   Kathie Dike, MD  isosorbide dinitrate (ISORDIL) 10 MG tablet Take 10 mg by mouth 2 (two) times daily.    [provider]  loperamide (IMODIUM) 2 MG capsule Take 1 capsule (2 mg total) by mouth 4 (four) times daily as needed for diarrhea or loose stools. 07/30/17   Dorie Rank, MD  Multiple Vitamins-Minerals (HAIR SKIN AND NAILS FORMULA PO) Take 1 tablet by mouth daily.    [provider]  nitroGLYCERIN (NITROSTAT) 0.4 MG SL tablet Place 1 tablet (0.4 mg total) under the tongue every 5 (five) minutes x 3 doses as needed for chest pain. 05/25/17   Delos Haring, PA-C  ondansetron  (ZOFRAN ODT) 8 MG disintegrating tablet Take 1 tablet (8 mg total) by mouth every 8 (eight) hours as needed for nausea or vomiting. 07/30/17   Dorie Rank, MD  ondansetron (ZOFRAN) 4 MG tablet Take 1 tablet (4 mg total) by mouth every 6 (six) hours. 07/04/17   [name], MD  PROAIR [address] [phone] BASE) MCG/ACT AEPB Inhale 2 puffs into the lungs every 4 (four) hours as needed (Shortness of breath).  12/27/14   [provider]    Family History Family History  Problem Relation Age of Onset  . Cervical cancer Mother   . Lung cancer Father   . Colon polyps Father        ???  . Pneumonia Brother   . Colon cancer Paternal Grandmother  76  . AAA (abdominal aortic aneurysm) Paternal Grandmother   . Breast cancer Paternal Grandmother   . Colon cancer Paternal Grandfather 17  . Barrett's esophagus Paternal Aunt   . Stomach cancer Neg Hx   . Pancreatic cancer Neg Hx     Social History Social History   Tobacco Use  . Smoking status: Former Smoker    Years: 0.00    Types: Cigarettes    Last attempt to quit: 02/14/2009    Years since quitting: 8.5  . Smokeless tobacco: Never Used  Substance Use Topics  . Alcohol use: No    Alcohol/week: 0.0 oz  . Drug use: No     Allergies   Brintellix [vortioxetine]; Yellow jacket venom; Adhesive [tape]; and Zoloft [sertraline hcl]   Review of Systems Review of Systems  Constitutional: Negative for chills and fever.  Respiratory: Negative for chest tightness and shortness of breath.   Cardiovascular: Negative for chest pain.  Genitourinary: Negative for difficulty urinating and dysuria.  Musculoskeletal: Negative for arthralgias, joint swelling and myalgias.  Skin: Negative for color change and wound.       Bruise to the right lower leg  Neurological: Negative for weakness and numbness.  Hematological: Negative for adenopathy.  All other systems reviewed and are negative.    Physical Exam Updated Vital Signs BP 115/73 (BP  Location: Right Arm)   Pulse 72   Temp 98.5 F (36.9 C)   Resp 18   Ht _0  (1.651 m)   Wt 82.6 kg (182 lb)   LMP 08/13/2011   SpO2 98%   BMI 30.29 kg/m   Physical Exam  Constitutional: She is oriented to person, place, and time. She appears well-developed and well-nourished. No distress.  HENT:  Head: Atraumatic.  Cardiovascular: Normal rate, regular rhythm, normal heart sounds and intact distal pulses.  DP and PT pulses are strong and brisk bilaterally  Pulmonary/Chest: Effort normal and breath sounds normal. No respiratory distress.  Musculoskeletal: Normal range of motion. She exhibits no edema or tenderness.  No edema, erythema or calf pain of the right lower leg.  Negative Homan's sign.    Neurological: She is alert and oriented to person, place, and time. No sensory deficit. Coordination normal.  Skin: Skin is warm. Capillary refill takes less than 2 seconds.  4 cm circular, old appearing bruise to the anterior right lower leg just proximal to the ankle and over the tibia.    Psychiatric: She has a normal mood and affect.  Nursing note and vitals reviewed.    ED Treatments / Results  Labs (all labs ordered are listed, but only abnormal results are displayed) Labs Reviewed - No data to display  EKG  EKG Interpretation None       Radiology No results found.  Procedures Procedures (including critical care time)  Medications Ordered in ED Medications - No data to display   Initial Impression / Assessment and Plan / ED Course  I have reviewed the triage vital signs and the nursing notes.  Pertinent labs & imaging results that were available during my care of the patient were reviewed by me and considered in my medical decision making (see chart for details).     Old appearing, single bruise to anterior lower leg.  Reported recent fall.  No concerning sx's for vasculitis or DVT.  NV intact.   Recent labs reviewed, platelet count 93.   Pt reassured.   Return precautions discussed.    Final Clinical Impressions(s) /  ED Diagnoses   Final diagnoses:  [place]    ED Discharge Orders    None       Kem Parkinson, PA-C 08/14/17 [place], [place], DO 08/16/17 2119

## 2017-08-26 ENCOUNTER — Other Ambulatory Visit: Payer: Self-pay

## 2017-08-26 ENCOUNTER — Emergency Department (HOSPITAL_COMMUNITY)
Admission: EM | Admit: 2017-08-26 | Discharge: 2017-08-27 | Disposition: A | Discharge status: Home / Self Care | Payer: Medicare HMO | Attending: Emergency Medicine | Admitting: Emergency Medicine

## 2017-08-26 ENCOUNTER — Encounter (HOSPITAL_COMMUNITY): Payer: Self-pay | Admitting: Emergency Medicine

## 2017-08-26 ENCOUNTER — Emergency Department (HOSPITAL_COMMUNITY): Payer: Medicare HMO

## 2017-08-26 DIAGNOSIS — J45909 Unspecified asthma, uncomplicated: Secondary | ICD-10-CM | POA: Insufficient documentation

## 2017-08-26 DIAGNOSIS — M5010 Cervical disc disorder with radiculopathy, unspecified cervical region: Secondary | ICD-10-CM | POA: Diagnosis not present

## 2017-08-26 DIAGNOSIS — R0602 Shortness of breath: Secondary | ICD-10-CM | POA: Diagnosis not present

## 2017-08-26 DIAGNOSIS — I252 Old myocardial infarction: Secondary | ICD-10-CM | POA: Diagnosis not present

## 2017-08-26 DIAGNOSIS — Z79899 Other long term (current) drug therapy: Secondary | ICD-10-CM | POA: Diagnosis not present

## 2017-08-26 DIAGNOSIS — M7918 Myalgia, other site: Secondary | ICD-10-CM | POA: Diagnosis not present

## 2017-08-26 DIAGNOSIS — Z7902 Long term (current) use of antithrombotics/antiplatelets: Secondary | ICD-10-CM | POA: Diagnosis not present

## 2017-08-26 DIAGNOSIS — M5030 Other cervical disc degeneration, unspecified cervical region: Secondary | ICD-10-CM

## 2017-08-26 DIAGNOSIS — Z87891 Personal history of nicotine dependence: Secondary | ICD-10-CM | POA: Insufficient documentation

## 2017-08-26 DIAGNOSIS — N183 Chronic kidney disease, stage 3 (moderate): Secondary | ICD-10-CM | POA: Insufficient documentation

## 2017-08-26 DIAGNOSIS — Z7982 Long term (current) use of aspirin: Secondary | ICD-10-CM | POA: Diagnosis not present

## 2017-08-26 DIAGNOSIS — I129 Hypertensive chronic kidney disease with stage 1 through stage 4 chronic kidney disease, or unspecified chronic kidney disease: Secondary | ICD-10-CM | POA: Insufficient documentation

## 2017-08-26 DIAGNOSIS — I2510 Atherosclerotic heart disease of native coronary artery without angina pectoris: Secondary | ICD-10-CM | POA: Insufficient documentation

## 2017-08-26 DIAGNOSIS — E1122 Type 2 diabetes mellitus with diabetic chronic kidney disease: Secondary | ICD-10-CM | POA: Insufficient documentation

## 2017-08-26 DIAGNOSIS — M542 Cervicalgia: Secondary | ICD-10-CM | POA: Diagnosis not present

## 2017-08-26 DIAGNOSIS — R079 Chest pain, unspecified: Secondary | ICD-10-CM | POA: Diagnosis not present

## 2017-08-26 LAB — BASIC METABOLIC PANEL
Anion gap: 8 (ref 5–15)
BUN: 20 mg/dL (ref 6–20)
CALCIUM: 8.9 mg/dL (ref 8.9–10.3)
CO2: 26 mmol/L (ref 22–32)
CREATININE: 1.63 mg/dL — AB (ref 0.44–1.00)
Chloride: 108 mmol/L (ref 101–111)
GFR calc non Af Amer: 36 mL/min — ABNORMAL LOW (ref >60)
GFR, EST AFRICAN AMERICAN: 41 mL/min — AB (ref >60)
Glucose, Bld: 119 mg/dL — ABNORMAL HIGH (ref 65–99)
Potassium: 4 mmol/L (ref 3.5–5.1)
SODIUM: 142 mmol/L (ref 135–145)

## 2017-08-26 LAB — I-STAT TROPONIN, ED: TROPONIN I, POC: 0 ng/mL (ref 0.00–0.08)

## 2017-08-26 LAB — CBC
HEMATOCRIT: 38.1 % (ref 36.0–46.0)
Hemoglobin: 11.9 g/dL — ABNORMAL LOW (ref 12.0–15.0)
MCH: 28.9 pg (ref 26.0–34.0)
MCHC: 31.2 g/dL (ref 30.0–36.0)
MCV: 92.5 fL (ref 78.0–100.0)
Platelets: 192 10*3/uL (ref 150–400)
RBC: 4.12 MIL/uL (ref 3.87–5.11)
RDW: 14.5 % (ref 11.5–15.5)
WBC: 8.2 10*3/uL (ref 4.0–10.5)

## 2017-08-26 LAB — CBG MONITORING, ED: Glucose-Capillary: 123 mg/dL — ABNORMAL HIGH (ref 65–99)

## 2017-08-26 LAB — I-STAT BETA HCG BLOOD, ED (MC, WL, AP ONLY): HCG, QUANTITATIVE: 8.7 m[IU]/mL — AB (ref <5)

## 2017-08-26 MED ORDER — ACETAMINOPHEN 500 MG PO TABS
1000.0000 mg | ORAL_TABLET | Freq: Once | ORAL | Status: AC
  Administered 2017-08-27: 1000 mg via ORAL

## 2017-08-26 MED ORDER — ASPIRIN 81 MG PO CHEW
324.0000 mg | CHEWABLE_TABLET | Freq: Once | ORAL | Status: AC
  Administered 2017-08-26: 324 mg via ORAL
  Filled 2017-08-26: qty 4

## 2017-08-26 MED ORDER — MORPHINE SULFATE (PF) 2 MG/ML IV SOLN
2.0000 mg | Freq: Once | INTRAVENOUS | Status: AC
  Administered 2017-08-26: 2 mg via INTRAVENOUS
  Filled 2017-08-26: qty 1

## 2017-08-26 MED ORDER — ONDANSETRON HCL 4 MG/2ML IJ SOLN
4.0000 mg | Freq: Once | INTRAMUSCULAR | Status: AC
  Administered 2017-08-26: 4 mg via INTRAVENOUS
  Filled 2017-08-26: qty 2

## 2017-08-26 MED ORDER — KETOROLAC TROMETHAMINE 30 MG/ML IJ SOLN
30.0000 mg | Freq: Once | INTRAMUSCULAR | Status: AC
  Administered 2017-08-26: 30 mg via INTRAVENOUS
  Filled 2017-08-26: qty 1

## 2017-08-26 NOTE — ED Triage Notes (Addendum)
Cp above lt breast radiates to lt arm, states her arm feels heavy. Pt took 3 nitros prior to ED arrival with no relief.  States she has a headache, it was mild then intensified after nitros.    NIH stroke scale negative.

## 2017-08-26 NOTE — ED Provider Notes (Signed)
[facility] EMERGENCY DEPARTMENT Provider Note   CSN: [id number] Arrival date & time: 08/26/17  2054     History   Chief Complaint Chief Complaint  Patient presents with  . Chest Pain    HPI Summer Cook is a 51 y.o. female with a history of MI, pulmonary embolism, CKD Stage III, CVA, hypertension, GERD and history of ITP s/p splenectomy presenting with acute onset of left-sided chest pressure with radiation of a heavy sensation into her left upper arm and left upper back approximately 45 minutes prior to arrival.  She was sitting in the drive thru at a local fast food restaurant when her symptoms started.  She endorses having a headache also prior to the event, worsened since taking nitroglycerin x3 which did not improve her chest pain symptoms.  She takes Plavix daily including today.  She does perceive mild shortness of breath, denies palpitations, nausea, vomiting or abdominal pain.  She states her symptoms are similar to her prior MI, except that her last MI had radiation of pain into her right arm.   Cardiac cath during admission for [facility]stem[facility] 05/20/17 showed a 100% occlusion of the dPDA with otherwise normal coronaries,treated with medical mangement  Including ASA, beta blocker, plavix and statin).    HPI  Past Medical History:  Diagnosis Date  . Allergy   . Anxiety   . Asthma   . CAD (coronary artery disease), native coronary artery    a. cath 05/20/17 - 100% distal PDA --> medical mangement (no intervention done)  . Candida esophagitis ([place])   . Chest pain at rest 04/2017  . Depression   . Essential hypertension, benign   . GERD (gastroesophageal reflux disease)   . Gout   . Hemorrhoids   . 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  Last was Prednisone 56m; 2 wk in 10/2012.  She also was on Prednisone bridged with Cellcept briefly but stopped due to lack of  insurance.   . Myocardial infarction ([place]   . Pulmonary embolism ([place] 04/2012  . Renal insufficiency   . Serrated adenoma of colon   . Steroid-induced diabetes ([place]   . Stroke ([place]   . Thrush, oral 05/29/2011    Patient Active Problem List   Diagnosis Date Noted  . Unstable angina ([place] 05/29/2017  . CAD (coronary artery disease): total distal RCA EF normal (05/20/17) 05/25/2017  . UTI (urinary tract infection) 05/25/2017  . Chest pain with moderate risk for cardiac etiology 05/23/2017  . Mixed hyperlipidemia   . NSTEMI (non-ST elevated myocardial infarction) ([place] 05/20/2017  . Diabetes mellitus type 2, uncomplicated ([place] 035/36/1443 . History of stroke   . Chronic back pain 12/23/2015  . Hypotension 02/16/2015  . Asthma, chronic 02/16/2015  . Anxiety state 02/16/2015  . CKD (chronic kidney disease) stage 3, GFR 30-59 ml/min (HCC) 04/28/2014  . Nausea with vomiting 01/25/2013  . Abnormal LFTs 01/25/2013  . Orthostatic hypotension 01/25/2013  . Rectal bleeding 12/26/2012  . History of pulmonary embolism 05/03/2012  . Hemorrhoids 06/04/2011  . 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 Immunosuppressed status ([place] 06/04/2011  . Depression 06/04/2011  . Obesity 06/04/2011  . Idiopathic thrombocytopenic purpura ([place] 03/18/2011    Past Surgical History:  Procedure Laterality Date  . BONE MARROW BIOPSY    . CARPAL TUNNEL RELEASE    . CARPAL TUNNEL RELEASE Left 05/20/2016   Procedure: CARPAL TUNNEL RELEASE;  Surgeon: SCarole Civil  MD;  Location: [facility] ORS;  Service: Orthopedics;  Laterality: Left;  . CARPAL TUNNEL RELEASE Right 07/16/2016   Procedure: RIGHT CARPAL TUNNEL RELEASE;  Surgeon: Carole Civil, MD;  Location: [facility] ORS;  Service: Orthopedics;  Laterality: Right;  . CATARACT EXTRACTION    . CHOLECYSTECTOMY  2008  . COLONOSCOPY WITH ESOPHAGOGASTRODUODENOSCOPY (EGD) N/A 01/04/2013   Dr. Gala Romney: esophageal plaques with +KOH, hh. Gastric antrum abnormal, bx reactive  gastropathy. Anal canal hemorrhoids, colonic tics, normal TI, single polyp (sessile serrated tubular adenoma). Next TCS 12/2017  . LEFT HEART CATH AND CORONARY ANGIOGRAPHY N/A 05/20/2017   Procedure: LEFT HEART CATH AND CORONARY ANGIOGRAPHY;  Surgeon: Martinique, Peter M, MD;  Location: [place] [facility];  Service: Cardiovascular;  Laterality: N/A;  . 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 TEE WITHOUT CARDIOVERSION N/A 01/03/2016   Procedure: TRANSESOPHAGEAL ECHOCARDIOGRAM (TEE) WITH PROPOFOL;  Surgeon: Satira Sark, MD;  Location: [facility];  Service: Cardiovascular;  Laterality: N/A;    OB History    No data available       Home Medications    Prior to Admission medications   Medication Sig Start Date End Date Taking? Authorizing Provider  acetaminophen (TYLENOL) 325 MG tablet Take 2 tablets (650 mg total) by mouth every 6 (six) hours as needed for mild pain (or Fever >/= 101). Can use for chest pain. 06/06/17   Debbe Odea, MD  aspirin EC 81 MG EC tablet Take 1 tablet (81 mg total) by mouth daily. 05/23/17   Eileen Stanford, PA-C  atorvastatin (LIPITOR) 80 MG tablet Take 1 tablet (80 mg total) by mouth daily at 6 PM. Patient taking differently: Take 80 mg by mouth at bedtime.  05/22/17   Eileen Stanford, PA-C  cephALEXin (KEFLEX) 500 MG capsule Take 1 capsule (500 mg total) by mouth 4 (four) times daily. 06/14/17   Milton Ferguson, MD  clopidogrel (PLAVIX) 75 MG tablet Take 1 tablet (75 mg total) by mouth daily. 05/23/17   Eileen Stanford, PA-C  diazepam (VALIUM) 5 MG tablet Take 1 tablet (5 mg total) by mouth 2 times daily at 12 noon and 4 pm. 08/27/17   , Almyra Free, PA-C  diclofenac sodium (VOLTAREN) 1 % GEL Apply 2 g topically 4 (four) times daily. 06/06/17   Debbe Odea, MD  escitalopram (LEXAPRO) 20 MG tablet Take 20 mg by mouth daily.  05/03/17   [provider]  HYDROcodone-acetaminophen (NORCO/VICODIN) 5-325 MG tablet Take 1 tablet by mouth every 6 (six) hours as needed.  08/27/17   Evalee Jefferson, PA-C  ipratropium-albuterol (DUONEB) 0.5-2.5 (3) MG/3ML SOLN Take 3 mLs by nebulization 3 (three) times daily as needed (shorntess of breath/wheezing). Patient taking differently: Take 3 mLs by nebulization 3 (three) times daily as needed (shortness of breath/wheezing).  02/18/15   Kathie Dike, MD  isosorbide dinitrate (ISORDIL) 10 MG tablet Take 10 mg by mouth 2 (two) times daily.    [provider]  loperamide (IMODIUM) 2 MG capsule Take 1 capsule (2 mg total) by mouth 4 (four) times daily as needed for diarrhea or loose stools. 07/30/17   Dorie Rank, MD  Multiple Vitamins-Minerals (HAIR SKIN AND NAILS FORMULA PO) Take 1 tablet by mouth daily.    [provider]  nitroGLYCERIN (NITROSTAT) 0.4 MG SL tablet Place 1 tablet (0.4 mg total) under the tongue every 5 (five) minutes x 3 doses as needed for chest pain. 05/25/17   Delos Haring, PA-C  ondansetron (ZOFRAN ODT) 8 MG disintegrating  tablet Take 1 tablet (8 mg total) by mouth every 8 (eight) hours as needed for nausea or vomiting. 07/30/17   Dorie Rank, MD  ondansetron (ZOFRAN) 4 MG tablet Take 1 tablet (4 mg total) by mouth every 6 (six) hours. 07/04/17   [name], MD  PROAIR RESPICLICK 440 [phone] BASE) MCG/ACT AEPB Inhale 2 puffs into the lungs every 4 (four) hours as needed (Shortness of breath).  12/27/14   [provider]    Family History Family History  Problem Relation Age of Onset  . Cervical cancer Mother   . Lung cancer Father   . Colon polyps Father        ???  . Pneumonia Brother   . Colon cancer Paternal Grandmother 88  . AAA (abdominal aortic aneurysm) Paternal Grandmother   . Breast cancer Paternal Grandmother   . Colon cancer Paternal Grandfather 47  . Barrett's esophagus Paternal Aunt   . Stomach cancer Neg Hx   . Pancreatic cancer Neg Hx     Social History Social History   Tobacco Use  . Smoking status: Former Smoker    Years: 0.00    Types: Cigarettes     Last attempt to quit: 02/14/2009    Years since quitting: 8.5  . Smokeless tobacco: Never Used  Substance Use Topics  . Alcohol use: No    Alcohol/week: 0.0 oz  . Drug use: No     Allergies   Brintellix [vortioxetine]; Yellow jacket venom; Adhesive [tape]; and Zoloft [sertraline hcl]   Review of Systems Review of Systems  Constitutional: Negative for fever.  HENT: Negative for congestion and sore throat.   Eyes: Negative.   Respiratory: Positive for shortness of breath. Negative for chest tightness.   Cardiovascular: Positive for chest pain. Negative for palpitations.  Gastrointestinal: Negative for abdominal pain, nausea and vomiting.  Genitourinary: Negative.   Musculoskeletal: Negative for arthralgias, joint swelling and neck pain.  Skin: Negative.  Negative for rash and wound.  Neurological: Positive for headaches. Negative for dizziness, weakness, light-headedness and numbness.  Psychiatric/Behavioral: Negative.      Physical Exam Updated Vital Signs BP 121/88   Pulse 72   Temp 98.3 F (36.8 C) (Oral)   Resp 19   Ht 5' 5" (1.651 m)   Wt 84.4 kg (186 lb)   LMP 08/13/2011   SpO2 96%   BMI 30.95 kg/m   Physical Exam  Constitutional: She is oriented to person, place, and time. She appears well-developed and well-nourished.  HENT:  Head: Normocephalic and atraumatic.  Eyes: Conjunctivae are normal.  Neck: Normal range of motion.  Cardiovascular: Normal rate, regular rhythm, normal heart sounds and intact distal pulses.  Pulmonary/Chest: Effort normal and breath sounds normal. She has no decreased breath sounds. She has no wheezes.      TTP left upper chest wall, left lateral cervical trapezius musculature.    Abdominal: Soft. Bowel sounds are normal. There is no tenderness. There is no guarding.  Musculoskeletal: Normal range of motion.  Neurological: She is alert and oriented to person, place, and time. She has normal strength. No cranial nerve deficit.  She exhibits normal muscle tone.  Hyperesthesia left upper and lower extremities. Negative pronator drift.  Equal grip strength.  No facial droop. No dysarthria.  Skin: Skin is warm and dry.  Psychiatric: She has a normal mood and affect.  Nursing note and vitals reviewed.    ED Treatments / Results  Labs (all labs ordered are listed, but only abnormal  results are displayed) Labs Reviewed  BASIC METABOLIC PANEL - Abnormal; Notable for the following components:      Result Value   Glucose, Bld 119 (*)    Creatinine, Ser 1.63 (*)    GFR calc non Af Amer 36 (*)    GFR calc Af Amer 41 (*)    All other components within normal limits  CBC - Abnormal; Notable for the following components:   Hemoglobin 11.9 (*)    All other components within normal limits  CBG MONITORING, ED - Abnormal; Notable for the following components:   Glucose-Capillary 123 (*)    All other components within normal limits  I-STAT BETA HCG BLOOD, ED ([facility], [facility], [facility] ONLY) - Abnormal; Notable for the following components:   I-stat hCG, quantitative 8.7 (*)    All other components within normal limits  I-STAT TROPONIN, ED  I-STAT TROPONIN, ED    EKG ED ECG REPORT   Date: 08/27/2017  Rate: 74  Rhythm: normal sinus rhythm  QRS Axis: normal  Intervals: QT prolonged  ST/T Wave abnormalities: nonspecific ST changes  Conduction Disutrbances:none  Narrative Interpretation: QT prolonged compared to last ekg 08/01/17  Old EKG Reviewed: changes noted  I have personally reviewed the EKG tracing and agree with the computerized printout as noted.   Radiology Dg Chest Portable 1 View  Result Date: 08/26/2017 CLINICAL DATA:  Left-sided chest pain and shortness of breath. EXAM: PORTABLE CHEST 1 VIEW COMPARISON:  Most recent radiograph 07/12/2017. Most recent chest CT 05/19/2017 FINDINGS: The cardiomediastinal contours are unchanged with mild cardiomegaly. The lungs are clear. Pulmonary vasculature is normal. No  consolidation, pleural effusion, or pneumothorax. No acute osseous abnormalities are seen. IMPRESSION: No acute abnormality.  Stable mild cardiomegaly. Electronically Signed   By: Jeb Levering M.D.   On: 08/26/2017 22:01    Procedures Procedures (including critical care time)  Medications Ordered in ED Medications  aspirin chewable tablet 324 mg (324 mg Oral Given 08/26/17 2151)  ondansetron (ZOFRAN) injection 4 mg (4 mg Intravenous Given 08/26/17 2212)  morphine 2 MG/ML injection 2 mg (2 mg Intravenous Given 08/26/17 2212)  ketorolac (TORADOL) 30 MG/ML injection 30 mg (30 mg Intravenous Given 08/26/17 2304)  ondansetron (ZOFRAN) injection 4 mg (4 mg Intravenous Given 08/26/17 2316)  acetaminophen (TYLENOL) tablet 1,000 mg (1,000 mg Oral Given 08/27/17 0007)  diazepam (VALIUM) tablet 5 mg (5 mg Oral Given 08/27/17 0010)     Initial Impression / Assessment and Plan / ED Course  I have reviewed the triage vital signs and the nursing notes.  Pertinent labs & imaging results that were available during my care of the patient were reviewed by me and considered in my medical decision making (see chart for details).     Pt with reproducible chest and upper back/neck pain suggesting muscle spasm, possible radicular pain.  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  She reports she was not aware of this diagnosis. She has had some neck pain but also with chronic low back pain, having been under the care of chronic pain in the past, tx with steroid injections which have not been helpful.  She is desirous of further eval/management of this condition - she was referred to Dr Merlene Laughter for further eval.  Pt given valium here with moderate improvement in pain. Prescribed same, advising to hold her xanax while taking valium.   Ekg, cxr, labs including delta trops negative for acute findings.  Doubt this is her ACS as source of todays sx.  Prn f/u anticipated.  Pt was seen by Dr Lita Mains during todays visit.  Final Clinical Impressions(s) / ED Diagnoses   Final diagnoses:  Muscle pain, cervical  Degenerative cervical disc    ED Discharge Orders        Ordered    diazepam (VALIUM) 5 MG tablet  2 times daily     08/27/17 0122    HYDROcodone-acetaminophen (NORCO/VICODIN) 5-325 MG tablet  Every 6 hours PRN     08/27/17 0122       Evalee Jefferson, PA-C 08/27/17 0235    Julianne Rice, MD 09/07/17 1214

## 2017-08-27 ENCOUNTER — Other Ambulatory Visit (HOSPITAL_COMMUNITY): Payer: Medicare HMO

## 2017-08-27 ENCOUNTER — Encounter (HOSPITAL_COMMUNITY): Payer: Medicare HMO | Attending: Oncology | Admitting: Oncology

## 2017-08-27 ENCOUNTER — Encounter (HOSPITAL_COMMUNITY): Payer: Self-pay

## 2017-08-27 VITALS — BP 107/70 | HR 86 | Temp 97.5°F | Resp 18 | Wt 184.4 lb

## 2017-08-27 DIAGNOSIS — J45909 Unspecified asthma, uncomplicated: Secondary | ICD-10-CM | POA: Diagnosis not present

## 2017-08-27 DIAGNOSIS — Z7902 Long term (current) use of antithrombotics/antiplatelets: Secondary | ICD-10-CM | POA: Diagnosis not present

## 2017-08-27 DIAGNOSIS — E1122 Type 2 diabetes mellitus with diabetic chronic kidney disease: Secondary | ICD-10-CM | POA: Diagnosis not present

## 2017-08-27 DIAGNOSIS — Z7982 Long term (current) use of aspirin: Secondary | ICD-10-CM | POA: Diagnosis not present

## 2017-08-27 DIAGNOSIS — N183 Chronic kidney disease, stage 3 (moderate): Secondary | ICD-10-CM | POA: Diagnosis not present

## 2017-08-27 DIAGNOSIS — I129 Hypertensive chronic kidney disease with stage 1 through stage 4 chronic kidney disease, or unspecified chronic kidney disease: Secondary | ICD-10-CM | POA: Diagnosis not present

## 2017-08-27 DIAGNOSIS — I2510 Atherosclerotic heart disease of native coronary artery without angina pectoris: Secondary | ICD-10-CM | POA: Diagnosis not present

## 2017-08-27 DIAGNOSIS — M5030 Other cervical disc degeneration, unspecified cervical region: Secondary | ICD-10-CM | POA: Diagnosis not present

## 2017-08-27 DIAGNOSIS — M542 Cervicalgia: Secondary | ICD-10-CM | POA: Diagnosis not present

## 2017-08-27 DIAGNOSIS — Z862 Personal history of diseases of the blood and blood-forming organs and certain disorders involving the immune mechanism: Secondary | ICD-10-CM | POA: Diagnosis not present

## 2017-08-27 DIAGNOSIS — I252 Old myocardial infarction: Secondary | ICD-10-CM | POA: Diagnosis not present

## 2017-08-27 LAB — I-STAT TROPONIN, ED: Troponin i, poc: 0 ng/mL (ref 0.00–0.08)

## 2017-08-27 MED ORDER — HYDROMORPHONE HCL 1 MG/ML IJ SOLN: 0.5000 mg | Freq: Once | INTRAMUSCULAR | Status: DC

## 2017-08-27 MED ORDER — LORAZEPAM 2 MG/ML IJ SOLN: 0.5000 mg | Freq: Once | INTRAMUSCULAR | Status: DC

## 2017-08-27 MED ORDER — HYDROCODONE-ACETAMINOPHEN 5-325 MG PO TABS: 1.0000 | ORAL_TABLET | Freq: Four times a day (QID) | ORAL | 0 refills | Status: DC | PRN

## 2017-08-27 MED ORDER — ACETAMINOPHEN 500 MG PO TABS
ORAL_TABLET | ORAL | Status: AC
  Filled 2017-08-27: qty 2

## 2017-08-27 MED ORDER — DIAZEPAM 5 MG PO TABS: 5.0000 mg | ORAL_TABLET | Freq: Two times a day (BID) | ORAL | 0 refills | Status: DC

## 2017-08-27 MED ORDER — DIAZEPAM 5 MG PO TABS
5.0000 mg | ORAL_TABLET | Freq: Once | ORAL | Status: AC
  Administered 2017-08-27: 5 mg via ORAL
  Filled 2017-08-27: qty 1

## 2017-08-27 NOTE — Discharge Instructions (Signed)
As discussed, an MRI you had last year suggested that she had some degenerative changes a disc of your cervical spine which could be the source of your neck and upper shoulder pain.  See the referrals above.  Take the medications prescribed for the next 5 days, holding your Xanax prescription while you are taking these medications.  Use caution as these will make you drowsy, do not drive within 4 hours of taking this medications.  I also suggest separating the hydrocodone and the Valium by at least 2 hours.  A heating pad applied to neck and shoulders may also help with pain and spasm.  Your lab tests, chest x-ray and EKG tonight all suggest that your symptoms are not cardiac related.

## 2017-08-27 NOTE — Patient Instructions (Signed)
[facility] at [facility] Discharge Instructions  RECOMMENDATIONS MADE BY THE CONSULTANT AND ANY TEST RESULTS WILL BE SENT TO YOUR REFERRING PHYSICIAN.  You saw Dr. Zhou today.  Thank you for choosing [facility] at [facility] to provide your oncology and hematology care.  To afford each patient quality time with our provider, please arrive at least 15 minutes before your scheduled appointment time.    If you have a lab appointment with the Cancer Center please come in thru the  Main Entrance and check in at the main information desk  You need to re-schedule your appointment should you arrive 10 or more minutes late.  We strive to give you quality time with our providers, and arriving late affects you and other patients whose appointments are after yours.  Also, if you no show three or more times for appointments you may be dismissed from the clinic at the providers discretion.     Again, thank you for choosing [facility].  Our hope is that these requests will decrease the amount of time that you wait before being seen by our physicians.       _____________________________________________________________  Should you have questions after your visit to [facility], please contact our office at [phone] between the hours of 8:30 a.m. and 4:30 p.m.  Voicemails left after 4:30 p.m. will not be returned until the following business day.  For prescription refill requests, have your pharmacy contact our office.       Resources For Cancer Patients and their Caregivers ? American Cancer Society: Can assist with transportation, wigs, general needs, runs Look Good Feel Better.        1-888-227-6333 ? Cancer Care: Provides financial assistance, online support groups, medication/co-pay assistance.  1-800-813-HOPE (4673) ? [facility] Assists [place] cancer patients and their families through  emotional , educational and financial support.  336-427-4357 ? [place] DSS Where to apply for food stamps, Medicaid and utility assistance. 336-342-1394 ? [facility]: Transportation to medical appointments. 336-347-2287 ? Social Security Administration: May apply for disability if have a Stage IV cancer. 336-342-7796 1-800-772-1213 ? [place] Aging, Disability and Transit Services: Assists with nutrition, care and transit needs. 336-349-2343  Cancer Center Support Programs: @10RELATIVEDAYS@ > Cancer Support Group  2nd Tuesday of the month 1pm-2pm, Journey Room  > Creative Journey  3rd Tuesday of the month 1130am-1pm, Journey Room  > Look Good Feel Better  1st Wednesday of the month 10am-12 noon, Journey Room (Call American Cancer Society to register 1-800-395-5775)    

## 2017-08-27 NOTE — Progress Notes (Signed)
Redmond School, MD [address] [place] [address]    DIAGNOSIS:  ITP with platelet count of 18K on 12/2012 diagnosed in 2011   Splenectomy   History of iron deficiency   DEXA with normal bone density 05/2014   Acute bi- hemispheric stroke, likely cardioembolic  CURRENT THERAPY: Observation  INTERVAL HISTORY: Summer Cook 51 y.o. female returns for follow-up of ITP.   Patient presents today for continue follow-up of her history of ITP.  She states she went to the ER last night for chest pain.  She had cardiac enzymes done which were all negative.  She states that her chest pain is substernal, feels like a tightness, and her chest wall is tender to palpation.  She is currently back on her antidepressants and is taking Lexapro.  She continues to have insomnia at nighttime.  She has shortness of breath with exertion.  She also has chronic fatigue.  She states she bruises easily but has not noticed any major bleeding including melena, hematochezia, hemoptysis, hematuria.   MEDICAL HISTORY: Past Medical History:  Diagnosis Date  . Allergy   . Anxiety   . Asthma   . CAD (coronary artery disease), native coronary artery    a. cath 05/20/17 - 100% distal PDA --> medical mangement (no intervention done)  . Candida esophagitis ([place])   . Chest pain at rest 04/2017  . Depression   . Essential hypertension, benign   . GERD (gastroesophageal reflux disease)   . Gout   . Hemorrhoids   . 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  Last was Prednisone '40mg'$ ; 2 wk in 10/2012.  She also was on Prednisone bridged with Cellcept briefly but stopped due to lack of insurance.   . Myocardial infarction ([place])   . Pulmonary embolism ([place]) 04/2012  . Renal insufficiency   . Serrated adenoma of colon   . Steroid-induced diabetes ([place])   . Stroke ([place])   . Thrush, oral 05/29/2011     has Idiopathic thrombocytopenic purpura ([place]); Hemorrhoids; Post-splenectomy; Immunosuppressed status ([place]); Depression; Obesity; History of pulmonary embolism; Rectal bleeding; Nausea with vomiting; Abnormal LFTs; Orthostatic hypotension; CKD (chronic kidney disease) stage 3, GFR 30-59 ml/min ([place]); Hypotension; Asthma, chronic; Anxiety state; Chronic back pain; History of stroke; Diabetes mellitus type 2, uncomplicated ([place]); NSTEMI (non-ST elevated myocardial infarction) ([place]); Mixed hyperlipidemia; Chest pain with moderate risk for cardiac etiology; CAD (coronary artery disease): total distal RCA EF normal (05/20/17); UTI (urinary tract infection); and Unstable angina (HCC) on their problem list.     is allergic to brintellix [vortioxetine]; yellow jacket venom; adhesive [tape]; and zoloft [sertraline hcl].  Summer Cook had no medications administered during this visit.  SURGICAL HISTORY: Past Surgical History:  Procedure Laterality Date  . BONE MARROW BIOPSY    . CARPAL TUNNEL RELEASE    . CARPAL TUNNEL RELEASE Left 05/20/2016   Procedure: CARPAL TUNNEL RELEASE;  Surgeon: Carole Civil, MD;  Location: [facility];  Service: Orthopedics;  Laterality: Left;  . CARPAL TUNNEL RELEASE Right 07/16/2016   Procedure: RIGHT CARPAL TUNNEL RELEASE;  Surgeon: Carole Civil, MD;  Location: [facility];  Service: Orthopedics;  Laterality: Right;  . CATARACT EXTRACTION    . CHOLECYSTECTOMY  2008  . COLONOSCOPY WITH ESOPHAGOGASTRODUODENOSCOPY (EGD) N/A 01/04/2013   Dr. Gala Romney: esophageal plaques with +KOH, hh. Gastric antrum abnormal, bx reactive gastropathy. Anal canal hemorrhoids, colonic tics, normal TI,  single polyp (sessile serrated tubular adenoma). Next TCS 12/2017  . LEFT HEART CATH AND CORONARY ANGIOGRAPHY N/A 05/20/2017   Procedure: LEFT HEART CATH AND CORONARY ANGIOGRAPHY;  Surgeon: Martinique, Peter M, MD;  Location: [facility];  Service: Cardiovascular;  Laterality: N/A;  .  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 TEE WITHOUT CARDIOVERSION N/A 01/03/2016   Procedure: TRANSESOPHAGEAL ECHOCARDIOGRAM (TEE) WITH PROPOFOL;  Surgeon: Satira Sark, MD;  Location: [facility];  Service: Cardiovascular;  Laterality: N/A;    SOCIAL HISTORY: Social History   Socioeconomic History  . Marital status: Divorced    Spouse name: Not on file  . Number of children: 1  . Years of education: Not on file  . Highest education level: Not on file  Social Needs  . Financial resource strain: Not hard at all  . Food insecurity - worry: Sometimes true  . Food insecurity - inability: Sometimes true  . Transportation needs - medical: No  . 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   Tobacco Use  . Smoking status: Former Smoker    Years: 0.00    Types: Cigarettes    Last attempt to quit: 02/14/2009    Years since quitting: 8.5  . Smokeless tobacco: Never Used  Substance and Sexual Activity  . Alcohol use: No    Alcohol/week: 0.0 oz  . Drug use: No  . Sexual activity: No  Other Topics Concern  . Not on file  Social History Narrative   Lives with her daughter and aunt in a one story home.  Has 1 daughter.  On disability.  Did work as a Optometrist and bus Geophysicist/field seismologist.  Education: some college.     FAMILY HISTORY: Family History  Problem Relation Age of Onset  . Cervical cancer Mother   . Lung cancer Father   . Colon polyps Father        ???  . Pneumonia Brother   . Colon cancer Paternal Grandmother 88  . AAA (abdominal aortic aneurysm) Paternal Grandmother   . Breast cancer Paternal Grandmother   . Colon cancer Paternal Grandfather 6  . Barrett's esophagus Paternal Aunt   . Stomach cancer Neg Hx   . Pancreatic cancer Neg Hx     Review of Systems  Constitutional: Negative for weight loss.  HENT: Negative for congestion.   Eyes: Negative.   Respiratory: Positive for shortness of  breath (with exertion).   Cardiovascular: Positive for chest pain.  Gastrointestinal: Negative for diarrhea, nausea and vomiting.  Genitourinary: Negative.   Musculoskeletal: Negative.   Skin: Negative.   Neurological: Negative.   Endo/Heme/Allergies: Negative.   Psychiatric/Behavioral: Positive for depression. The patient has insomnia.   All other systems reviewed and are negative.  14 point review of systems was performed and is negative except as detailed under history of present illness and above   PHYSICAL EXAMINATION  ECOG PERFORMANCE STATUS: 1 - Symptomatic but completely ambulatory  Vitals:   08/27/17 0954  BP: 107/70  Pulse: 86  Resp: 18  Temp: (!) 97.5 F (36.4 C)  SpO2: 98%     Physical Exam  Constitutional: She is oriented to person, place, and time and well-developed, well-nourished, and in no distress. No distress.  Pt was able to get on exam table without assistance.   HENT:  Head: Normocephalic and atraumatic.  Nose: Nose normal.  Mouth/Throat: Oropharynx is clear and moist.  No oropharyngeal exudate.  Eyes: Conjunctivae and EOM are normal. Pupils are equal, round, and reactive to light. Right eye exhibits no discharge. Left eye exhibits no discharge. No scleral icterus.  Neck: Normal range of motion. Neck supple. No JVD present. No tracheal deviation present. No thyromegaly present.  Cardiovascular: Normal rate, regular rhythm and normal heart sounds. Exam reveals no gallop and no friction rub.  No murmur heard. Pulmonary/Chest: Effort normal and breath sounds normal. No respiratory distress. She has no wheezes. She has no rales.  Abdominal: Soft. Bowel sounds are normal. She exhibits no distension and no mass. There is no tenderness. There is no rebound and no guarding.  Musculoskeletal: Normal range of motion. She exhibits no edema or tenderness.  Lymphadenopathy:    She has no cervical adenopathy.  Neurological: She is alert and oriented to person,  place, and time. She has normal reflexes. No cranial nerve deficit. Gait normal. Coordination normal.  Skin: Skin is warm and dry. No rash noted. No erythema. No pallor.  Psychiatric: Mood, memory, affect and judgment normal.  Nursing note and vitals reviewed.   LABORATORY DATA: I have reviewed the data below as listed. CMP Latest Ref Rng & Units 08/26/2017 08/01/2017 07/30/2017  Glucose 65 - 99 mg/dL 119(H) 98 65  BUN 6 - 20 mg/dL 20 29(H) 37(H)  Creatinine 0.44 - 1.00 mg/dL 1.63(H) 2.02(H) 2.29(H)  Sodium 135 - 145 mmol/L 142 138 138  Potassium 3.5 - 5.1 mmol/L 4.0 3.6 3.5  Chloride 101 - 111 mmol/L 108 108 109  CO2 22 - 32 mmol/L 26 22 18(L)  Calcium 8.9 - 10.3 mg/dL 8.9 8.6(L) 8.2(L)  Total Protein 6.5 - 8.1 g/dL - 6.2(L) -  Total Bilirubin 0.3 - 1.2 mg/dL - 0.5 -  Alkaline Phos 38 - 126 U/L - 80 -  AST 15 - 41 U/L - 28 -  ALT 14 - 54 U/L - 19 -   CBC    Component Value Date/Time   WBC 8.2 08/26/2017 2149   RBC 4.12 08/26/2017 2149   HGB 11.9 (L) 08/26/2017 2149   HGB 11.1 (L) 02/02/2013 0840   HCT 38.1 08/26/2017 2149   HCT 34.8 02/02/2013 0840   PLT 192 08/26/2017 2149   PLT 336 02/02/2013 0840   MCV 92.5 08/26/2017 2149   MCV 74.3 (L) 02/02/2013 0840   MCH 28.9 08/26/2017 2149   MCHC 31.2 08/26/2017 2149   RDW 14.5 08/26/2017 2149   RDW 21.1 (H) 02/02/2013 0840   LYMPHSABS 4.4 (H) 07/04/2017 1842   LYMPHSABS 0.8 (L) 02/02/2013 0840   MONOABS 0.7 07/04/2017 1842   MONOABS 0.3 02/02/2013 0840   EOSABS 0.3 07/04/2017 1842   EOSABS 0.0 02/02/2013 0840   BASOSABS 0.1 07/04/2017 1842   BASOSABS 0.0 02/02/2013 0840     Radiographic Studies I have reviewed the below radiographic studies and agree with their findings.  Study Result   CLINICAL DATA:  Syncope.  History of PE 2 years ago.  EXAM: NUCLEAR MEDICINE VENTILATION - PERFUSION LUNG SCAN  TECHNIQUE: Ventilation images were obtained in multiple projections using inhaled aerosol Tc-34mDTPA. Perfusion  images were obtained in multiple projections after intravenous injection of Tc-935mAA.  RADIOPHARMACEUTICALS:  30.5 mCi Technetium-9966mPA aerosol inhalation and 4.2 mCi Technetium-50m40m IV  COMPARISON:  Chest x-ray from earlier today  FINDINGS: Ventilation: No focal ventilation defect.  Perfusion: No wedge shaped peripheral perfusion defects to suggest acute pulmonary embolism.  IMPRESSION: Negative for pulmonary embolism.   Electronically  Signed   By: Monte Fantasia M.D.   On: 06/17/2016 15:32     ASSESSMENT and THERAPY PLAN:   ITP  51 year old female with ITP diagnosed in 2011. She failed splenectomy. She has been off all therapy for some time.    PLAN: No evidence of thrombocytopenia. Platelets have been WNL. Reviewed labs with the patient, platelets 192k today. Recommended for her to try NSAID with ibuprofen '800mg'$  PO TID for the next few days to see if her chest pain symptoms will improve since this may be costochondritis since she is tender to palpation of her sternum. Follow up with cardiology at her scheduled appt on 09/10/17. RTC in 6 months for follow up with repeat labs.  Orders Placed This Encounter  Procedures  . CBC with Differential    Standing Status:   Future    Standing Expiration Date:   08/27/2018  . Comprehensive metabolic panel    Standing Status:   Future    Standing Expiration Date:   08/27/2018

## 2017-09-01 DIAGNOSIS — E271 Primary adrenocortical insufficiency: Secondary | ICD-10-CM | POA: Diagnosis not present

## 2017-09-01 DIAGNOSIS — I10 Essential (primary) hypertension: Secondary | ICD-10-CM | POA: Diagnosis not present

## 2017-09-01 DIAGNOSIS — R69 Illness, unspecified: Secondary | ICD-10-CM | POA: Diagnosis not present

## 2017-09-01 DIAGNOSIS — G894 Chronic pain syndrome: Secondary | ICD-10-CM | POA: Diagnosis not present

## 2017-09-01 DIAGNOSIS — L719 Rosacea, unspecified: Secondary | ICD-10-CM | POA: Diagnosis not present

## 2017-09-01 DIAGNOSIS — I2699 Other pulmonary embolism without acute cor pulmonale: Secondary | ICD-10-CM | POA: Diagnosis not present

## 2017-09-01 DIAGNOSIS — D696 Thrombocytopenia, unspecified: Secondary | ICD-10-CM | POA: Diagnosis not present

## 2017-09-01 DIAGNOSIS — D693 Immune thrombocytopenic purpura: Secondary | ICD-10-CM | POA: Diagnosis not present

## 2017-09-01 DIAGNOSIS — M5020 Other cervical disc displacement, unspecified cervical region: Secondary | ICD-10-CM | POA: Diagnosis not present

## 2017-09-01 DIAGNOSIS — Z6831 Body mass index (BMI) 31.0-31.9, adult: Secondary | ICD-10-CM | POA: Diagnosis not present

## 2017-09-09 NOTE — Progress Notes (Deleted)
Cardiology Office Note   Date:  09/09/2017   ID:  Summer Cook, DOB Jul 05, 1966, MRN [id number]  PCP:  Redmond School, MD  Cardiologist:  Satira Sark No chief complaint on file.    History of Present Illness: Summer Cook is a 51 y.o. female who presents for ongoing assessment and management of chest pain, with known history of single-vessel CAD involving the distal PDA, hypertension, CVA, history of pulmonary emboli but not on anticoagulation, status post ITP with splenectomy.  The patient was last seen in the office on 06/11/2017.  At that time she was better.  And did not have any complaints of recurrent chest pain.  Blood pressure was low normal and therefore  discontinued metoprolol which she was taking at a very low dose.Unfortunately, she was seen in the emergency room on 08/26/2017 for muscle pain, she was ruled out for cardiac etiology    Past Medical History:  Diagnosis Date  . Allergy   . Anxiety   . Asthma   . CAD (coronary artery disease), native coronary artery    a. cath 05/20/17 - 100% distal PDA --> medical mangement (no intervention done)  . Candida esophagitis ([place])   . Chest pain at rest 04/2017  . Depression   . Essential hypertension, benign   . GERD (gastroesophageal reflux disease)   . Gout   . Hemorrhoids   . 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  Last was Prednisone '40mg'$ ; 2 wk in 10/2012.  She also was on Prednisone bridged with Cellcept briefly but stopped due to lack of insurance.   . Myocardial infarction ([place])   . Pulmonary embolism ([place]) 04/2012  . Renal insufficiency   . Serrated adenoma of colon   . Steroid-induced diabetes ([place])   . Stroke ([place])   . Thrush, oral 05/29/2011    Past Surgical History:  Procedure Laterality Date  . BONE MARROW BIOPSY    . CARPAL TUNNEL RELEASE    . CARPAL TUNNEL RELEASE Left 05/20/2016   Procedure: CARPAL TUNNEL RELEASE;  Surgeon: Carole Civil, MD;  Location: [facility];  Service: Orthopedics;  Laterality: Left;  . CARPAL TUNNEL RELEASE Right 07/16/2016   Procedure: RIGHT CARPAL TUNNEL RELEASE;  Surgeon: Carole Civil, MD;  Location: [facility];  Service: Orthopedics;  Laterality: Right;  . CATARACT EXTRACTION    . CHOLECYSTECTOMY  2008  . COLONOSCOPY WITH ESOPHAGOGASTRODUODENOSCOPY (EGD) N/A 01/04/2013   Dr. Gala Romney: esophageal plaques with +KOH, hh. Gastric antrum abnormal, bx reactive gastropathy. Anal canal hemorrhoids, colonic tics, normal TI, single polyp (sessile serrated tubular adenoma). Next TCS 12/2017  . LEFT HEART CATH AND CORONARY ANGIOGRAPHY N/A 05/20/2017   Procedure: LEFT HEART CATH AND CORONARY ANGIOGRAPHY;  Surgeon: Martinique, Peter M, MD;  Location: [facility];  Service: Cardiovascular;  Laterality: N/A;  . 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 TEE WITHOUT CARDIOVERSION N/A 01/03/2016   Procedure: TRANSESOPHAGEAL ECHOCARDIOGRAM (TEE) WITH PROPOFOL;  Surgeon: Satira Sark, MD;  [facility];  Service: Cardiovascular;  Laterality: N/A;     Current Outpatient Medications  Medication Sig Dispense Refill  . acetaminophen (TYLENOL) 325 MG tablet Take 2 tablets (650 mg total) by mouth every 6 (six) hours as needed for mild pain (or Fever >/= 101). Can use for chest pain.    Marland Kitchen aspirin EC 81 MG EC tablet Take 1 tablet (81 mg total) by mouth daily.    Marland Kitchen  atorvastatin (LIPITOR) 80 MG tablet Take 1 tablet (80 mg total) by mouth daily at 6 PM. (Patient taking differently: Take 80 mg by mouth at bedtime. ) 90 tablet 1  . clopidogrel (PLAVIX) 75 MG tablet Take 1 tablet (75 mg total) by mouth daily. 90 tablet 0  . diazepam (VALIUM) 5 MG tablet Take 1 tablet (5 mg total) by mouth 2 times daily at 12 noon and 4 pm. 15 tablet 0  . diclofenac sodium (VOLTAREN) 1 % GEL Apply 2 g topically 4 (four) times daily.    Marland Kitchen escitalopram (LEXAPRO) 20 MG tablet Take 20 mg by mouth  daily.     Marland Kitchen HYDROcodone-acetaminophen (NORCO/VICODIN) 5-325 MG tablet Take 1 tablet by mouth every 6 (six) hours as needed. 12 tablet 0  . ipratropium-albuterol (DUONEB) 0.5-2.5 (3) MG/3ML SOLN Take 3 mLs by nebulization 3 (three) times daily as needed (shorntess of breath/wheezing). (Patient taking differently: Take 3 mLs by nebulization 3 (three) times daily as needed (shortness of breath/wheezing). ) 360 mL 0  . isosorbide dinitrate (ISORDIL) 10 MG tablet Take 10 mg by mouth 2 (two) times daily.    Marland Kitchen loperamide (IMODIUM) 2 MG capsule Take 1 capsule (2 mg total) by mouth 4 (four) times daily as needed for diarrhea or loose stools. 12 capsule 0  . Multiple Vitamins-Minerals (HAIR SKIN AND NAILS FORMULA PO) Take 1 tablet by mouth daily.    . nitroGLYCERIN (NITROSTAT) 0.4 MG SL tablet Place 1 tablet (0.4 mg total) under the tongue every 5 (five) minutes x 3 doses as needed for chest pain. 25 tablet 12  . ondansetron (ZOFRAN ODT) 8 MG disintegrating tablet Take 1 tablet (8 mg total) by mouth every 8 (eight) hours as needed for nausea or vomiting. 12 tablet 0  . ondansetron (ZOFRAN) 4 MG tablet Take 1 tablet (4 mg total) by mouth every 6 (six) hours. 10 tablet 0  . PROAIR RESPICLICK 322 (90 BASE) MCG/ACT AEPB Inhale 2 puffs into the lungs every 4 (four) hours as needed (Shortness of breath).   3   No current facility-administered medications for this visit.     Allergies:   Brintellix [vortioxetine]; Yellow jacket venom; Adhesive [tape]; and Zoloft [sertraline hcl]    Social History:  The patient  reports that she quit smoking about 8 years ago. Her smoking use included cigarettes. She quit after 0.00 years of use. she has never used smokeless tobacco. She reports that she does not drink alcohol or use drugs.   Family History:  The patient's family history includes AAA (abdominal aortic aneurysm) in her paternal grandmother; Barrett's esophagus in her paternal aunt; Breast cancer in her paternal  grandmother; Cervical cancer in her mother; Colon cancer (age of onset: 81) in her paternal grandfather; Colon cancer (age of onset: 53) in her paternal grandmother; Colon polyps in her father; Lung cancer in her father; Pneumonia in her brother.    ROS: All other systems are reviewed and negative. Unless otherwise mentioned in H&P    PHYSICAL EXAM: VS:  LMP 08/13/2011  , BMI There is no height or weight on file to calculate BMI. GEN: Well nourished, well developed, in no acute distress  HEENT: normal  Neck: no JVD, carotid bruits, or masses Cardiac: ***RRR; no murmurs, rubs, or gallops,no edema  Respiratory:  clear to auscultation bilaterally, normal work of breathing GI: soft, nontender, nondistended, + BS MS: no deformity or atrophy  Skin: warm and dry, no rash Neuro:  Strength and sensation are intact  Psych: euthymic mood, full affect   EKG:  EKG {ACTION; IS/IS [id number]} ordered today. The ekg ordered today demonstrates ***   Recent Labs: 05/23/2017: B Natriuretic Peptide 43.5 06/06/2017: Magnesium 2.0; TSH 2.861 08/01/2017: ALT 19 08/26/2017: BUN 20; Creatinine, Ser 1.63; Hemoglobin 11.9; Platelets 192; Potassium 4.0; Sodium 142    Lipid Panel    Component Value Date/Time   CHOL 229 (H) 05/19/2017 1912   TRIG 114 05/19/2017 1912   HDL 57 05/19/2017 1912   CHOLHDL 4.0 05/19/2017 1912   VLDL 23 05/19/2017 1912   LDLCALC 149 (H) 05/19/2017 1912      Wt Readings from Last 3 Encounters:  08/27/17 184 lb 6.4 oz (83.6 kg)  08/26/17 186 lb (84.4 kg)  08/13/17 182 lb (82.6 kg)      Other studies Reviewed: Echocardiogram Apr 12, 2017 Left ventricle: The cavity size was normal. Systolic function was   normal. Wall motion was normal; there were no regional wall   motion abnormalities. Doppler parameters are consistent with   abnormal left ventricular relaxation (grade 1 diastolic   dysfunction). Doppler parameters are consistent with elevated   ventricular end-diastolic  filling pressure. - Aortic valve: There was no regurgitation. - Mitral valve: There was mild regurgitation. - Right ventricle: The cavity size was normal. Wall thickness was   normal. Systolic function was normal. - Right atrium: The atrium was normal in size. - Tricuspid valve: There was no regurgitation. - Pulmonic valve: There was no regurgitation. - Pulmonary arteries: Systolic pressure could not be accurately   estimated. - Inferior vena cava: The vessel was normal in size. - Pericardium, extracardiac: There was no pericardial effusion. ASSESSMENT AND PLAN:  1.  ***   Current medicines are reviewed at length with the patient today.    Labs/ tests ordered today include: *** Phill Myron. [place] Pugh, ANP, AACC   09/09/2017 12:09 PM    [facility] [address], [place], [place] [address] Phone: [phone]; Fax: [phone]

## 2017-09-10 ENCOUNTER — Ambulatory Visit: Payer: Medicare HMO | Admitting: Adult Health

## 2017-09-15 IMAGING — US US CAROTID DUPLEX BILAT
1 series · 13 of 24 positions shown · non-contrast
Comparison: None.

CLINICAL DATA: Recent stroke.

EXAM:
BILATERAL CAROTID DUPLEX ULTRASOUND
TECHNIQUE: Gray scale imaging, color Doppler and duplex ultrasound were
performed of bilateral carotid and vertebral arteries in the neck.

[Series 1: us carotid duplex bilat · 0.05mm/px · 13 of 68 slices shown]
[im 1/68]
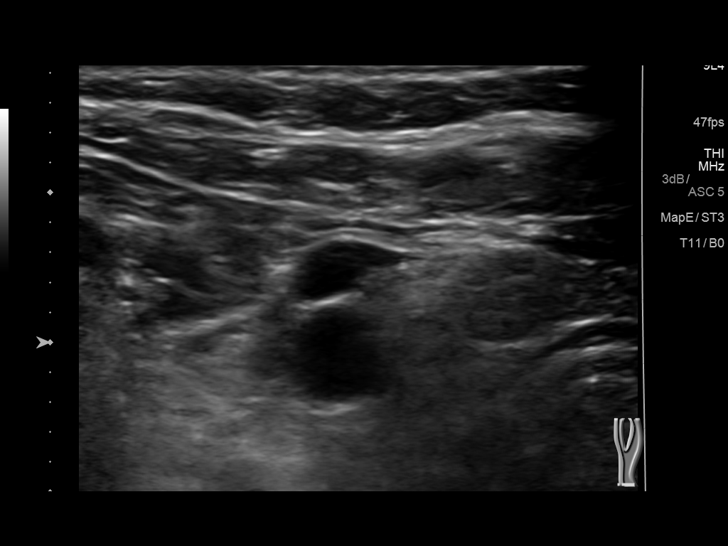
[im 6/68]
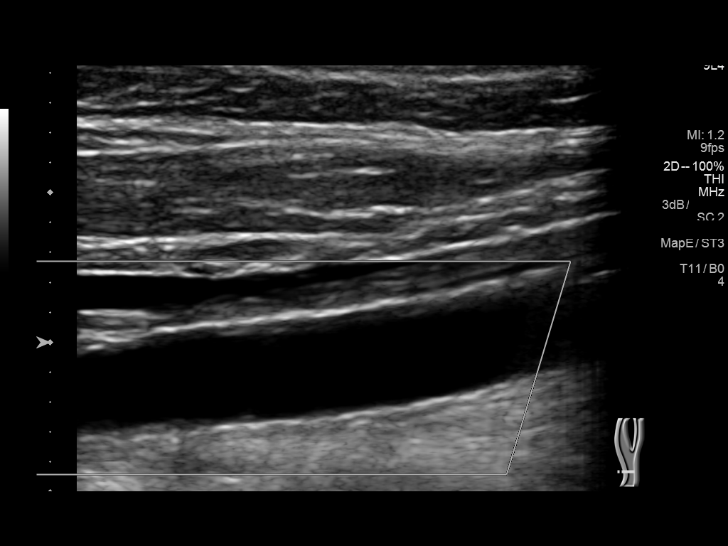
[im 12/68]
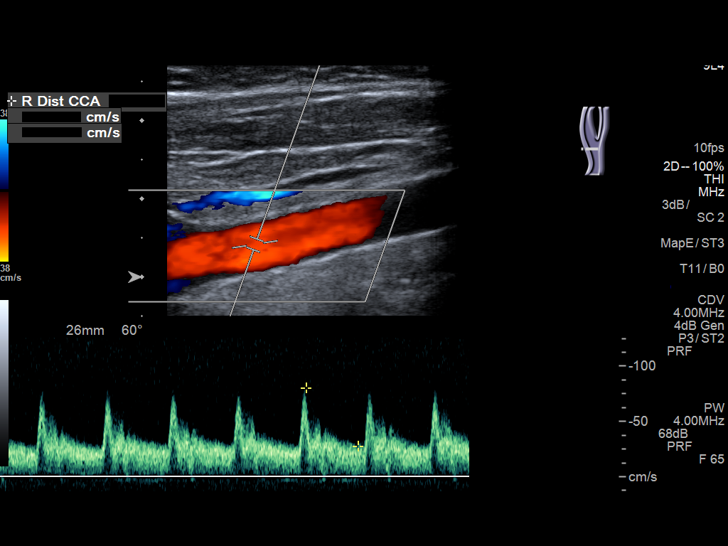
[im 18/68]
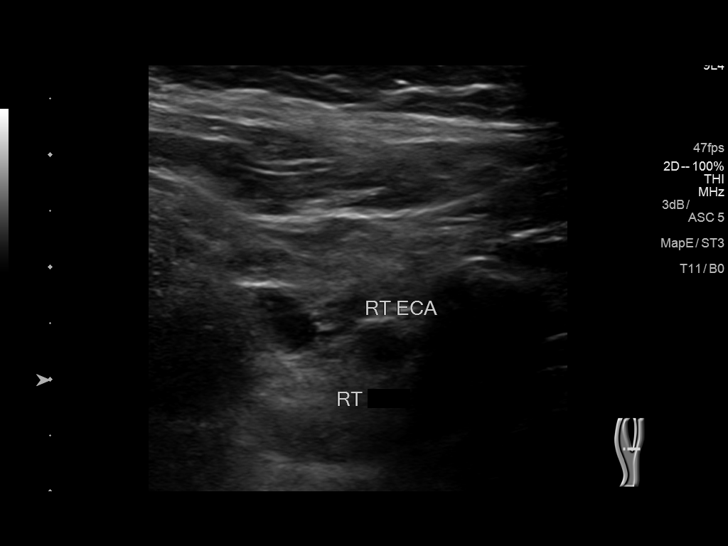
[im 24/68]
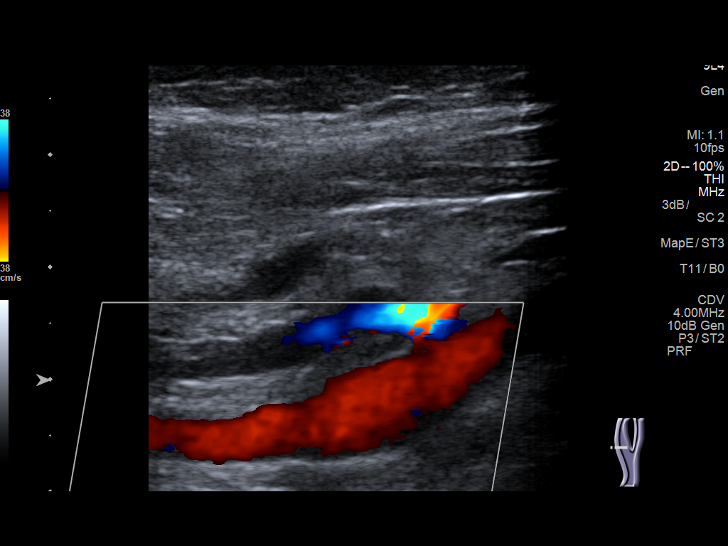
[im 30/68]
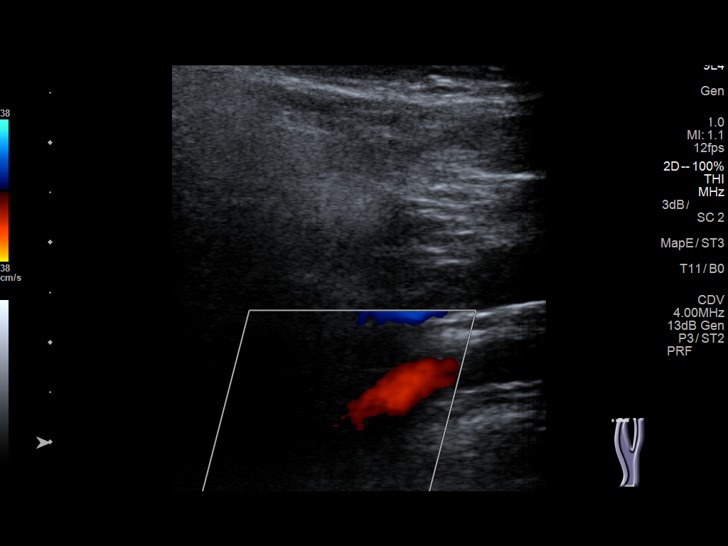
[im 35/68]
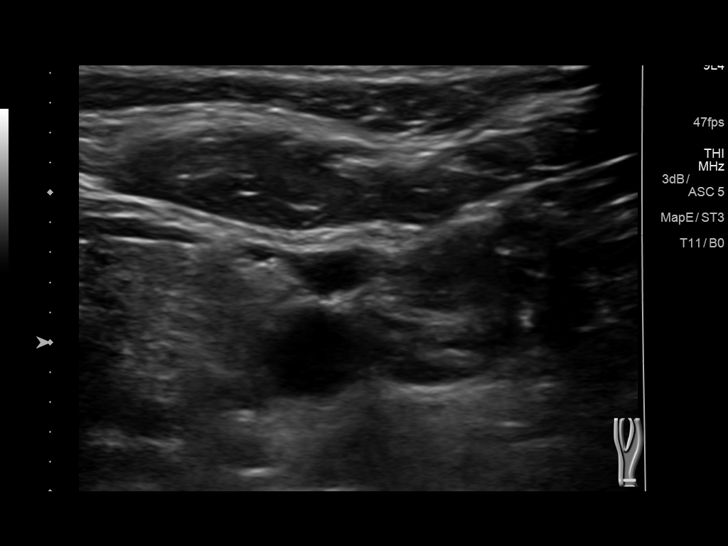
[im 38/68]
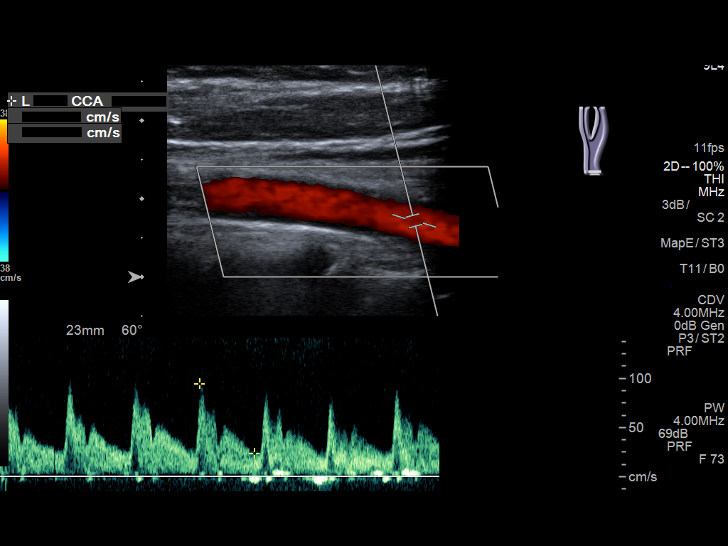
[im 44/68]
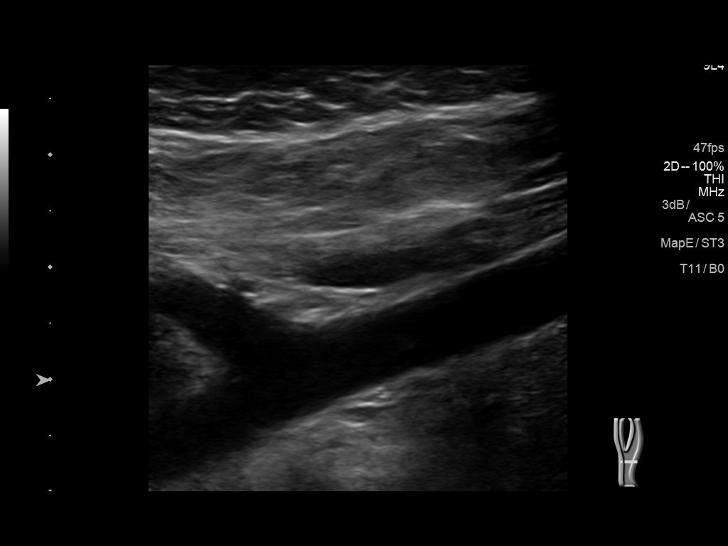
[im 50/68]
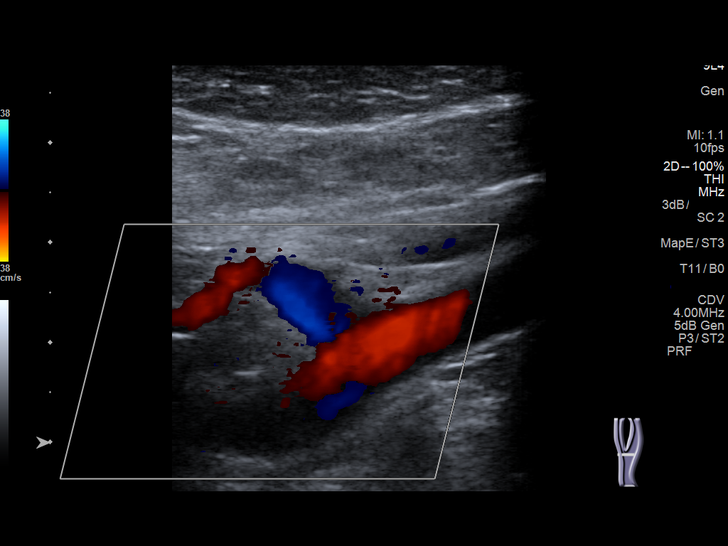
[im 56/68]
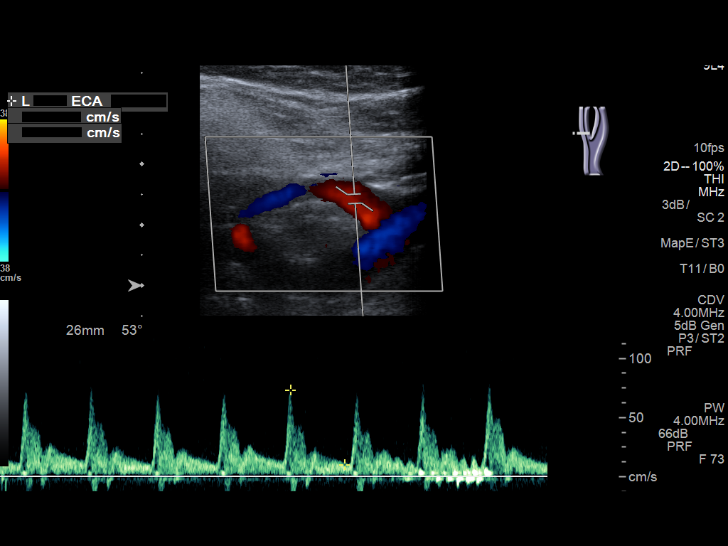
[im 62/68]
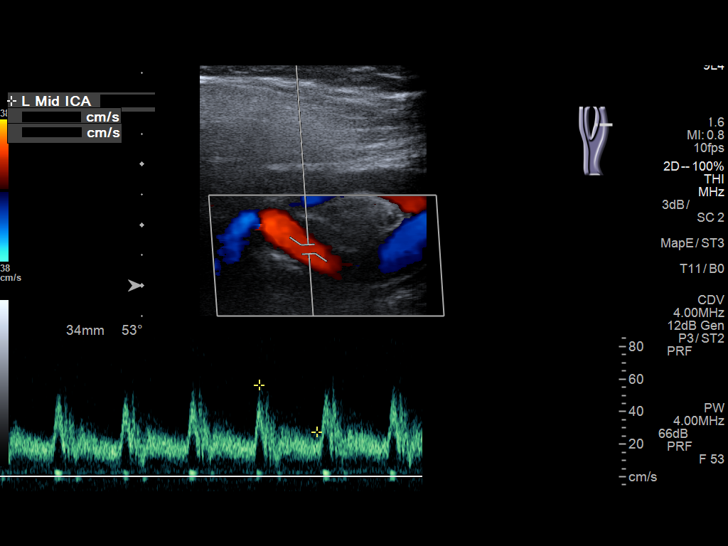
[im 68/68]
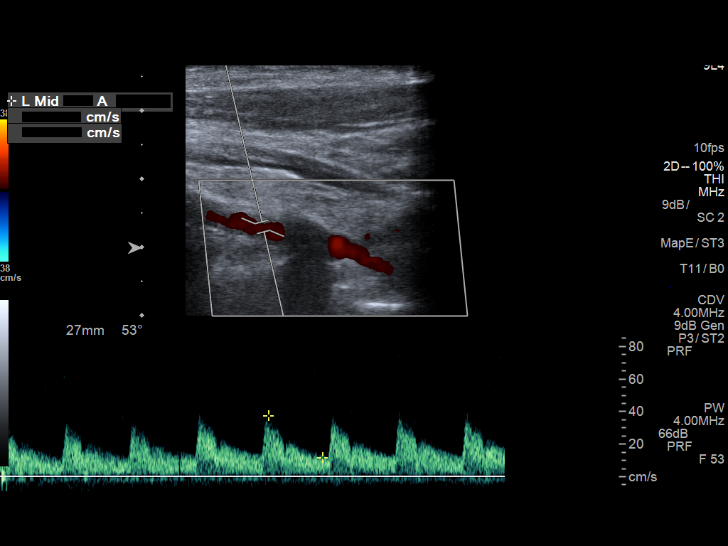

[13 of 24 positions shown; findings below may reference images not displayed]

FINDINGS: Criteria: Quantification of carotid stenosis is based on velocity
parameters that correlate the residual internal carotid diameter
with NASCET-based stenosis levels, using the diameter of the distal
internal carotid lumen as the denominator for stenosis measurement.

The following velocity measurements were obtained:

RIGHT

ICA:  71 cm/sec

CCA:  80 cm/sec

SYSTOLIC ICA/CCA RATIO:

DIASTOLIC ICA/CCA RATIO:

ECA:  96 cm/sec

LEFT

ICA:  76 cm/sec

CCA:  106 cm/sec

SYSTOLIC ICA/CCA RATIO:

DIASTOLIC ICA/CCA RATIO:

ECA:  73 cm/sec

RIGHT CAROTID ARTERY: Question intimal thickening at the carotid
bulb and proximal internal carotid artery. Normal waveforms and
velocities within the right internal carotid artery.

RIGHT VERTEBRAL ARTERY: Antegrade flow and normal waveform in the
right vertebral artery.

LEFT CAROTID ARTERY: Left carotid arteries are patent without
significant plaque or stenosis. Normal waveforms and velocities
within the left internal carotid artery.

LEFT VERTEBRAL ARTERY: Antegrade flow and normal waveform in the
left vertebral artery.
IMPRESSION: No significant atherosclerotic disease or stenosis in the carotid
arteries.

## 2017-11-09 ENCOUNTER — Other Ambulatory Visit (HOSPITAL_COMMUNITY): Payer: Self-pay | Admitting: Internal Medicine

## 2017-11-09 DIAGNOSIS — Z1231 Encounter for screening mammogram for malignant neoplasm of breast: Secondary | ICD-10-CM

## 2017-11-10 ENCOUNTER — Ambulatory Visit (HOSPITAL_COMMUNITY)
Admission: RE | Admit: 2017-11-10 | Discharge: 2017-11-10 | Disposition: A | Discharge status: Home / Self Care | Payer: Medicare HMO | Source: Ambulatory Visit | Attending: Internal Medicine | Admitting: Internal Medicine

## 2017-11-10 DIAGNOSIS — Z1231 Encounter for screening mammogram for malignant neoplasm of breast: Secondary | ICD-10-CM | POA: Insufficient documentation

## 2017-11-11 ENCOUNTER — Emergency Department (HOSPITAL_COMMUNITY): Payer: Medicare HMO

## 2017-11-11 ENCOUNTER — Other Ambulatory Visit: Payer: Self-pay

## 2017-11-11 ENCOUNTER — Emergency Department (HOSPITAL_COMMUNITY)
Admission: EM | Admit: 2017-11-11 | Discharge: 2017-11-11 | Disposition: A | Discharge status: Home / Self Care | Payer: Medicare HMO | Attending: Emergency Medicine | Admitting: Emergency Medicine

## 2017-11-11 ENCOUNTER — Encounter (HOSPITAL_COMMUNITY): Payer: Self-pay | Admitting: *Deleted

## 2017-11-11 DIAGNOSIS — Z7982 Long term (current) use of aspirin: Secondary | ICD-10-CM | POA: Insufficient documentation

## 2017-11-11 DIAGNOSIS — J45909 Unspecified asthma, uncomplicated: Secondary | ICD-10-CM | POA: Diagnosis not present

## 2017-11-11 DIAGNOSIS — N183 Chronic kidney disease, stage 3 (moderate): Secondary | ICD-10-CM | POA: Insufficient documentation

## 2017-11-11 DIAGNOSIS — E119 Type 2 diabetes mellitus without complications: Secondary | ICD-10-CM | POA: Insufficient documentation

## 2017-11-11 DIAGNOSIS — Z8673 Personal history of transient ischemic attack (TIA), and cerebral infarction without residual deficits: Secondary | ICD-10-CM | POA: Insufficient documentation

## 2017-11-11 DIAGNOSIS — R110 Nausea: Secondary | ICD-10-CM | POA: Diagnosis not present

## 2017-11-11 DIAGNOSIS — I129 Hypertensive chronic kidney disease with stage 1 through stage 4 chronic kidney disease, or unspecified chronic kidney disease: Secondary | ICD-10-CM | POA: Diagnosis not present

## 2017-11-11 DIAGNOSIS — I252 Old myocardial infarction: Secondary | ICD-10-CM | POA: Diagnosis not present

## 2017-11-11 DIAGNOSIS — R072 Precordial pain: Secondary | ICD-10-CM | POA: Insufficient documentation

## 2017-11-11 DIAGNOSIS — Z79899 Other long term (current) drug therapy: Secondary | ICD-10-CM | POA: Diagnosis not present

## 2017-11-11 DIAGNOSIS — I2510 Atherosclerotic heart disease of native coronary artery without angina pectoris: Secondary | ICD-10-CM | POA: Insufficient documentation

## 2017-11-11 DIAGNOSIS — R079 Chest pain, unspecified: Secondary | ICD-10-CM | POA: Diagnosis not present

## 2017-11-11 DIAGNOSIS — Z7902 Long term (current) use of antithrombotics/antiplatelets: Secondary | ICD-10-CM | POA: Diagnosis not present

## 2017-11-11 DIAGNOSIS — R0602 Shortness of breath: Secondary | ICD-10-CM | POA: Diagnosis not present

## 2017-11-11 DIAGNOSIS — Z87891 Personal history of nicotine dependence: Secondary | ICD-10-CM | POA: Insufficient documentation

## 2017-11-11 LAB — CBC
HEMATOCRIT: 38.6 % (ref 36.0–46.0)
HEMOGLOBIN: 12.2 g/dL (ref 12.0–15.0)
MCH: 28.9 pg (ref 26.0–34.0)
MCHC: 31.6 g/dL (ref 30.0–36.0)
MCV: 91.5 fL (ref 78.0–100.0)
Platelets: 176 10*3/uL (ref 150–400)
RBC: 4.22 MIL/uL (ref 3.87–5.11)
RDW: 13.6 % (ref 11.5–15.5)
WBC: 6.4 10*3/uL (ref 4.0–10.5)

## 2017-11-11 LAB — BASIC METABOLIC PANEL
ANION GAP: 10 (ref 5–15)
BUN: 24 mg/dL — ABNORMAL HIGH (ref 6–20)
CALCIUM: 9.1 mg/dL (ref 8.9–10.3)
CHLORIDE: 104 mmol/L (ref 101–111)
CO2: 25 mmol/L (ref 22–32)
Creatinine, Ser: 1.43 mg/dL — ABNORMAL HIGH (ref 0.44–1.00)
GFR calc non Af Amer: 42 mL/min — ABNORMAL LOW (ref >60)
GFR, EST AFRICAN AMERICAN: 48 mL/min — AB (ref >60)
Glucose, Bld: 84 mg/dL (ref 65–99)
POTASSIUM: 5.5 mmol/L — AB (ref 3.5–5.1)
Sodium: 139 mmol/L (ref 135–145)

## 2017-11-11 LAB — TROPONIN I

## 2017-11-11 MED ORDER — FENTANYL CITRATE (PF) 100 MCG/2ML IJ SOLN
25.0000 ug | Freq: Once | INTRAMUSCULAR | Status: AC
  Administered 2017-11-11: 25 ug via INTRAVENOUS
  Filled 2017-11-11: qty 2

## 2017-11-11 MED ORDER — ACETAMINOPHEN 325 MG PO TABS
650.0000 mg | ORAL_TABLET | Freq: Once | ORAL | Status: AC
  Administered 2017-11-11: 650 mg via ORAL
  Filled 2017-11-11: qty 2

## 2017-11-11 MED ORDER — ASPIRIN 81 MG PO CHEW
324.0000 mg | CHEWABLE_TABLET | Freq: Once | ORAL | Status: AC
Start: 2017-11-11 — End: 2017-11-11
  Administered 2017-11-11: 324 mg via ORAL
  Filled 2017-11-11: qty 4

## 2017-11-11 MED ORDER — NITROGLYCERIN 0.4 MG SL SUBL
0.4000 mg | SUBLINGUAL_TABLET | SUBLINGUAL | Status: DC | PRN
  Administered 2017-11-11 (×3): 0.4 mg via SUBLINGUAL
  Filled 2017-11-11: qty 1

## 2017-11-11 NOTE — ED Notes (Addendum)
Wrong chart

## 2017-11-11 NOTE — ED Notes (Signed)
Gone to xray  

## 2017-11-11 NOTE — ED Notes (Signed)
Patient given discharge instruction, verbalized understand. Patient ambulatory out of the department.  

## 2017-11-11 NOTE — ED Provider Notes (Signed)
[facility] EMERGENCY DEPARTMENT Provider Note   CSN: [id number] Arrival date & time: 11/11/17  1250     History   Chief Complaint Chief Complaint  Patient presents with  . Chest Pain    HPI Summer Cook is a 52 y.o. female.  Patient with onset of left anterior chest pain nonradiating constant in nature as an ache or pressure.  Since onset on Tuesday evening.  Today is Thursday.  The patient in the past had a non-STEMI.  That resulted in right-sided chest pain.  Is followed by cardiology.  Pain is not made worse by any movement.  But the pain is present with pressing on the left anterior chest.  It is associated with some nausea as well.  Patient is non-STEMI was in the August September stent timeframe.  Patient was not did not have stents.      Past Medical History:  Diagnosis Date  . Allergy   . Anxiety   . Asthma   . CAD (coronary artery disease), native coronary artery    a. cath 05/20/17 - 100% distal PDA --> medical mangement (no intervention done)  . Candida esophagitis ([place])   . Chest pain at rest 04/2017  . Depression   . Essential hypertension, benign   . GERD (gastroesophageal reflux disease)   . Gout   . Hemorrhoids   . 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  Last was Prednisone '40mg'$ ; 2 wk in 10/2012.  She also was on Prednisone bridged with Cellcept briefly but stopped due to lack of insurance.   . Myocardial infarction ([place])   . Pulmonary embolism ([place]) 04/2012  . Renal insufficiency   . Serrated adenoma of colon   . Steroid-induced diabetes ([place])   . Stroke ([place])   . Thrush, oral 05/29/2011    Patient Active Problem List   Diagnosis Date Noted  . Unstable angina ([place]) 05/29/2017  . CAD (coronary artery disease): total distal RCA EF normal (05/20/17) 05/25/2017  . UTI (urinary tract infection) 05/25/2017  . Chest pain with moderate risk for  cardiac etiology 05/23/2017  . Mixed hyperlipidemia   . NSTEMI (non-ST elevated myocardial infarction) ([place]) 05/20/2017  . Diabetes mellitus type 2, uncomplicated ([place]) 73/53/2992  . History of stroke   . Chronic back pain 12/23/2015  . Hypotension 02/16/2015  . Asthma, chronic 02/16/2015  . Anxiety state 02/16/2015  . CKD (chronic kidney disease) stage 3, GFR 30-59 ml/min (H[facility]) 04/28/2014  . Nausea with vomiting 01/25/2013  . Abnormal LFTs 01/25/2013  . Orthostatic hypotension 01/25/2013  . Rectal bleeding 12/26/2012  . History of pulmonary embolism 05/03/2012  . Hemorrhoids 06/04/2011  . 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 Immunosuppressed status ([place]) 06/04/2011  . Depression 06/04/2011  . Obesity 06/04/2011  . Idiopathic thrombocytopenic purpura ([place]) 03/18/2011    Past Surgical History:  Procedure Laterality Date  . BONE MARROW BIOPSY    . CARPAL TUNNEL RELEASE    . CARPAL TUNNEL RELEASE Left 05/20/2016   Procedure: CARPAL TUNNEL RELEASE;  Surgeon: Carole Civil, MD;  Location: [facility];  Service: Orthopedics;  Laterality: Left;  . CARPAL TUNNEL RELEASE Right 07/16/2016   Procedure: RIGHT CARPAL TUNNEL RELEASE;  Surgeon: Carole Civil, MD;  Location: [facility];  Service: Orthopedics;  Laterality: Right;  . CATARACT EXTRACTION    . CHOLECYSTECTOMY  2008  . COLONOSCOPY WITH ESOPHAGOGASTRODUODENOSCOPY (EGD) N/A 01/04/2013   Dr. Gala Romney: esophageal plaques with +KOH,  hh. Gastric antrum abnormal, bx reactive gastropathy. Anal canal hemorrhoids, colonic tics, normal TI, single polyp (sessile serrated tubular adenoma). Next TCS 12/2017  . LEFT HEART CATH AND CORONARY ANGIOGRAPHY N/A 05/20/2017   Procedure: LEFT HEART CATH AND CORONARY ANGIOGRAPHY;  Surgeon: Martinique, Peter M, MD;  Location: [facility];  Service: Cardiovascular;  Laterality: N/A;  . 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 TEE WITHOUT CARDIOVERSION N/A 01/03/2016   Procedure: TRANSESOPHAGEAL ECHOCARDIOGRAM (TEE) WITH PROPOFOL;   Surgeon: Satira Sark, MD;  Location: [facility];  Service: Cardiovascular;  Laterality: N/A;    OB History    No data available       Home Medications    Prior to Admission medications   Medication Sig Start Date End Date Taking? Authorizing Provider  acetaminophen (TYLENOL) 325 MG tablet Take 2 tablets (650 mg total) by mouth every 6 (six) hours as needed for mild pain (or Fever >/= 101). Can use for chest pain. 06/06/17   Debbe Odea, MD  aspirin EC 81 MG EC tablet Take 1 tablet (81 mg total) by mouth daily. 05/23/17   Eileen Stanford, PA-C  atorvastatin (LIPITOR) 80 MG tablet Take 1 tablet (80 mg total) by mouth daily at 6 PM. Patient taking differently: Take 80 mg by mouth at bedtime.  05/22/17   Eileen Stanford, PA-C  clopidogrel (PLAVIX) 75 MG tablet Take 1 tablet (75 mg total) by mouth daily. 05/23/17   Eileen Stanford, PA-C  diazepam (VALIUM) 5 MG tablet Take 1 tablet (5 mg total) by mouth 2 times daily at 12 noon and 4 pm. 08/27/17   Idol, Almyra Free, PA-C  diclofenac sodium (VOLTAREN) 1 % GEL Apply 2 g topically 4 (four) times daily. 06/06/17   Debbe Odea, MD  escitalopram (LEXAPRO) 20 MG tablet Take 20 mg by mouth daily.  05/03/17   [provider]  HYDROcodone-acetaminophen (NORCO/VICODIN) 5-325 MG tablet Take 1 tablet by mouth every 6 (six) hours as needed. 08/27/17   Evalee Jefferson, PA-C  ipratropium-albuterol (DUONEB) 0.5-2.5 (3) MG/3ML SOLN Take 3 mLs by nebulization 3 (three) times daily as needed (shorntess of breath/wheezing). Patient taking differently: Take 3 mLs by nebulization 3 (three) times daily as needed (shortness of breath/wheezing).  02/18/15   Kathie Dike, MD  isosorbide dinitrate (ISORDIL) 10 MG tablet Take 10 mg by mouth 2 (two) times daily.    [provider]  loperamide (IMODIUM) 2 MG capsule Take 1 capsule (2 mg total) by mouth 4 (four) times daily as needed for diarrhea or loose stools. 07/30/17   Dorie Rank, MD  Multiple  Vitamins-Minerals (HAIR SKIN AND NAILS FORMULA PO) Take 1 tablet by mouth daily.    [provider]  nitroGLYCERIN (NITROSTAT) 0.4 MG SL tablet Place 1 tablet (0.4 mg total) under the tongue every 5 (five) minutes x 3 doses as needed for chest pain. 05/25/17   Delos Haring, PA-C  ondansetron (ZOFRAN ODT) 8 MG disintegrating tablet Take 1 tablet (8 mg total) by mouth every 8 (eight) hours as needed for nausea or vomiting. 07/30/17   Dorie Rank, MD  ondansetron (ZOFRAN) 4 MG tablet Take 1 tablet (4 mg total) by mouth every 6 (six) hours. 07/04/17   [name], MD  PROAIR RESPICLICK [phone] BASE) MCG/ACT AEPB Inhale 2 puffs into the lungs every 4 (four) hours as needed (Shortness of breath).  12/27/14   [provider]  prochlorperazine (COMPAZINE) 10 MG tablet Take 10 mg by mouth as needed.  For nausea   12/18/11  [provider]    Family History Family History  Problem Relation Age of Onset  . Cervical cancer Mother   . Lung cancer Father   . Colon polyps Father        ???  . Pneumonia Brother   . Colon cancer Paternal Grandmother 88  . AAA (abdominal aortic aneurysm) Paternal Grandmother   . Breast cancer Paternal Grandmother   . Colon cancer Paternal Grandfather 53  . Barrett's esophagus Paternal Aunt   . Stomach cancer Neg Hx   . Pancreatic cancer Neg Hx     Social History Social History   Tobacco Use  . Smoking status: Former Smoker    Years: 0.00    Types: Cigarettes    Last attempt to quit: 02/14/2009    Years since quitting: 8.7  . Smokeless tobacco: Never Used  Substance Use Topics  . Alcohol use: No    Alcohol/week: 0.0 oz  . Drug use: No     Allergies   Brintellix [vortioxetine]; Yellow jacket venom; Adhesive [tape]; and Zoloft [sertraline hcl]   Review of Systems Review of Systems  Constitutional: Negative for fever.  HENT: Negative for congestion.   Eyes: Negative for redness.  Respiratory: Positive for shortness of breath.     Cardiovascular: Positive for chest pain. Negative for leg swelling.  Gastrointestinal: Positive for nausea. Negative for abdominal pain and vomiting.  Genitourinary: Negative for dysuria.  Musculoskeletal: Negative for back pain.  Skin: Negative for rash.  Neurological: Negative for headaches.  Hematological: Does not bruise/bleed easily.     Physical Exam Updated Vital Signs BP (!) 123/96   Pulse 69   Temp 98.2 F (36.8 C) (Oral)   Resp 10   Ht 1.651 m ('5\' 5"'$ )   Wt 84.8 kg (187 lb)   LMP 08/13/2011   SpO2 92%   BMI 31.12 kg/m   Physical Exam  Constitutional: She is oriented to person, place, and time. She appears well-developed and well-nourished. No distress.  HENT:  Head: Normocephalic and atraumatic.  Mouth/Throat: Oropharynx is clear and moist.  Eyes: Conjunctivae and EOM are normal. Pupils are equal, round, and reactive to light.  Neck: Normal range of motion. Neck supple.  Cardiovascular: Normal rate, regular rhythm and normal heart sounds.  Pulmonary/Chest: Effort normal and breath sounds normal. She exhibits tenderness.  Tenderness to palpation left anterior chest.  Abdominal: Soft. Bowel sounds are normal. There is no tenderness.  Musculoskeletal: Normal range of motion. She exhibits no edema.  Neurological: She is alert and oriented to person, place, and time. No cranial nerve deficit or sensory deficit. She exhibits normal muscle tone. Coordination normal.  Skin: Skin is warm.  Nursing note and vitals reviewed.    ED Treatments / Results  Labs (all labs ordered are listed, but only abnormal results are displayed) Labs Reviewed  BASIC METABOLIC PANEL - Abnormal; Notable for the following components:      Result Value   Potassium 5.5 (*)    BUN 24 (*)    Creatinine, Ser 1.43 (*)    GFR calc non Af Amer 42 (*)    GFR calc Af Amer 48 (*)    All other components within normal limits  CBC  TROPONIN I    EKG  EKG Interpretation  Date/Time:  Thursday  November 11 2017 13:06:53 EST Ventricular Rate:  68 PR Interval:    QRS Duration: 86 QT Interval:  673 QTC Calculation: 716 R Axis:   -  161 Text Interpretation:  Sinus rhythm Anterior infarct, age indeterminate Prolonged QT interval Confirmed by [name] [id number]) on 11/11/2017 2:47:40 PM       Radiology Dg Chest 2 View  Result Date: 11/11/2017 CLINICAL DATA:  Chest pain with nausea. EXAM: CHEST  2 VIEW COMPARISON:  August 26, 2017 and July 12, 2017 FINDINGS: There is no edema or consolidation. The heart size and pulmonary vascularity are normal. No adenopathy. There is slight anterior wedging of the T10 and T12 vertebral bodies. IMPRESSION: No edema or consolidation. Electronically Signed   By: Lowella Grip III M.D.   On: 11/11/2017 14:49   Mm Screening Breast Tomo Bilateral  Result Date: 11/10/2017 CLINICAL DATA:  Screening. EXAM: DIGITAL SCREENING BILATERAL MAMMOGRAM WITH TOMO AND CAD COMPARISON:  Previous exam(s). ACR Breast Density Category a: The breast tissue is almost entirely fatty. FINDINGS: There are no findings suspicious for malignancy. Images were processed with CAD. IMPRESSION: No mammographic evidence of malignancy. A result letter of this screening mammogram will be mailed directly to the patient. RECOMMENDATION: Screening mammogram in one year. (Code:SM-B-01Y) BI-RADS CATEGORY  1: Negative. Electronically Signed   By: Trude Mcburney M.D.   On: 11/10/2017 15:56    Procedures Procedures (including critical care time)  Medications Ordered in ED Medications  nitroGLYCERIN (NITROSTAT) SL tablet 0.4 mg (0.4 mg Sublingual Given 11/11/17 1509)  fentaNYL (SUBLIMAZE) injection 25 mcg (not administered)  acetaminophen (TYLENOL) tablet 650 mg (650 mg Oral Given 11/11/17 1453)  aspirin chewable tablet 324 mg (324 mg Oral Given 11/11/17 1456)     Initial Impression / Assessment and Plan / ED Course  I have reviewed the triage vital signs and the nursing  notes.  Pertinent labs & imaging results that were available during my care of the patient were reviewed by me and considered in my medical decision making (see chart for details).     Patient with left anterior chest pain constant ache sometimes increased pressure associated with some nausea and some shortness of breath.  Not made worse by movement does not radiate anywhere.  Started Tuesday night.  Today is Thursday.  Patient had a past history of a non-STEMI.  That pain was right-sided at that time.  She is followed by cardiology.  New  Workup here without any acute findings.  Troponin negative would expected to be elevated unstable angina after this long period of time or if there was an MI.  Chest x-ray no acute findings.  Labs without significant abnormalities other than elevated potassium at 5.5 and some slight renal insufficiency with a creatinine of 1.43.  Recommend she follows up with cardiology for both the chest pain and for these electrolyte abnormalities.  Patient treated here with nitroglycerin and aspirin with no real change.  Treated with IV pain medicine with some improvement.  Suspect pain is noncardiac in nature.  In addition patient had tenderness to palpation also over the left anterior chest suggestive that this may be chest wall in nature.  Did entertain possible concern for pulmonary embolus no leg swelling not tachycardic oxygen saturations on room air upper 90s.  Feel it is highly unlikely.`  Final Clinical Impressions(s) / ED Diagnoses   Final diagnoses:  Precordial pain    ED Discharge Orders    None       [name], MD 11/11/17 1606

## 2017-11-11 NOTE — ED Notes (Signed)
Chest heaviness and a times dull pain on left side of chest x 2 days, nausea, no vomiting, fatique

## 2017-11-11 NOTE — Discharge Instructions (Signed)
Workup here today without any acute cardiac findings.  Continue take your medication as you have at home.  Call cardiology for follow-up.  Return for any new or worse symptoms.

## 2017-11-23 ENCOUNTER — Encounter (HOSPITAL_COMMUNITY): Payer: Self-pay | Admitting: Emergency Medicine

## 2017-11-23 ENCOUNTER — Emergency Department (HOSPITAL_COMMUNITY): Payer: Medicare HMO

## 2017-11-23 ENCOUNTER — Emergency Department (HOSPITAL_COMMUNITY)
Admission: EM | Admit: 2017-11-23 | Discharge: 2017-11-23 | Disposition: A | Discharge status: Home / Self Care | Payer: Medicare HMO | Attending: Emergency Medicine | Admitting: Emergency Medicine

## 2017-11-23 ENCOUNTER — Other Ambulatory Visit: Payer: Self-pay

## 2017-11-23 DIAGNOSIS — J45909 Unspecified asthma, uncomplicated: Secondary | ICD-10-CM | POA: Insufficient documentation

## 2017-11-23 DIAGNOSIS — Z79899 Other long term (current) drug therapy: Secondary | ICD-10-CM | POA: Diagnosis not present

## 2017-11-23 DIAGNOSIS — N183 Chronic kidney disease, stage 3 (moderate): Secondary | ICD-10-CM | POA: Diagnosis not present

## 2017-11-23 DIAGNOSIS — I2510 Atherosclerotic heart disease of native coronary artery without angina pectoris: Secondary | ICD-10-CM | POA: Insufficient documentation

## 2017-11-23 DIAGNOSIS — Z7902 Long term (current) use of antithrombotics/antiplatelets: Secondary | ICD-10-CM | POA: Diagnosis not present

## 2017-11-23 DIAGNOSIS — E1122 Type 2 diabetes mellitus with diabetic chronic kidney disease: Secondary | ICD-10-CM | POA: Diagnosis not present

## 2017-11-23 DIAGNOSIS — Z8673 Personal history of transient ischemic attack (TIA), and cerebral infarction without residual deficits: Secondary | ICD-10-CM | POA: Insufficient documentation

## 2017-11-23 DIAGNOSIS — Z7982 Long term (current) use of aspirin: Secondary | ICD-10-CM | POA: Insufficient documentation

## 2017-11-23 DIAGNOSIS — Z87891 Personal history of nicotine dependence: Secondary | ICD-10-CM | POA: Diagnosis not present

## 2017-11-23 DIAGNOSIS — R072 Precordial pain: Secondary | ICD-10-CM | POA: Diagnosis not present

## 2017-11-23 DIAGNOSIS — Z86711 Personal history of pulmonary embolism: Secondary | ICD-10-CM | POA: Insufficient documentation

## 2017-11-23 DIAGNOSIS — R079 Chest pain, unspecified: Secondary | ICD-10-CM | POA: Diagnosis not present

## 2017-11-23 LAB — BASIC METABOLIC PANEL
Anion gap: 12 (ref 5–15)
BUN: 27 mg/dL — ABNORMAL HIGH (ref 6–20)
CALCIUM: 9.1 mg/dL (ref 8.9–10.3)
CO2: 21 mmol/L — ABNORMAL LOW (ref 22–32)
Chloride: 103 mmol/L (ref 101–111)
Creatinine, Ser: 1.74 mg/dL — ABNORMAL HIGH (ref 0.44–1.00)
GFR calc Af Amer: 38 mL/min — ABNORMAL LOW (ref >60)
GFR, EST NON AFRICAN AMERICAN: 33 mL/min — AB (ref >60)
Glucose, Bld: 93 mg/dL (ref 65–99)
POTASSIUM: 4.7 mmol/L (ref 3.5–5.1)
SODIUM: 136 mmol/L (ref 135–145)

## 2017-11-23 LAB — CBC
HEMATOCRIT: 43.8 % (ref 36.0–46.0)
Hemoglobin: 13.9 g/dL (ref 12.0–15.0)
MCH: 28.8 pg (ref 26.0–34.0)
MCHC: 31.7 g/dL (ref 30.0–36.0)
MCV: 90.7 fL (ref 78.0–100.0)
PLATELETS: 199 10*3/uL (ref 150–400)
RBC: 4.83 MIL/uL (ref 3.87–5.11)
RDW: 13.3 % (ref 11.5–15.5)
WBC: 7.1 10*3/uL (ref 4.0–10.5)

## 2017-11-23 LAB — I-STAT TROPONIN, ED: TROPONIN I, POC: 0 ng/mL (ref 0.00–0.08)

## 2017-11-23 IMAGING — NM NM PULMONARY VENT & PERF
1 series · 1 of 1 positions shown · non-contrast
Comparison: Chest x-ray from earlier today

CLINICAL DATA: Syncope.  History of PE 2 years ago.

EXAM:
NUCLEAR MEDICINE VENTILATION - PERFUSION LUNG SCAN
TECHNIQUE: Ventilation images were obtained in multiple projections using
inhaled aerosol 7c-BBm DTPA. Perfusion images were obtained in
multiple projections after intravenous injection of 7c-BBm MAA.
RADIOPHARMACEUTICALS:  30.5 mCi Cechnetium-FFm DTPA aerosol
inhalation and 4.2 mCi Cechnetium-FFm MAA IV

[Series 1: ant post vent · 2.07mm/px · 1 of 1 slices shown]
[im 1/1]
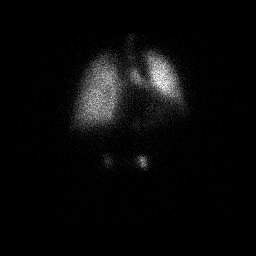

[1 of 1 positions shown; findings below may reference images not displayed]

FINDINGS: Ventilation: No focal ventilation defect.

Perfusion: No wedge shaped peripheral perfusion defects to suggest
acute pulmonary embolism.
IMPRESSION: Negative for pulmonary embolism.

## 2017-11-23 IMAGING — CT CT HEAD W/O CM
4 series · 15 of 47 positions shown, 17 images · non-contrast
Comparison: Head CT scan and brain MRI 12/16/2015.

CLINICAL DATA: Dizziness and syncope today.

EXAM:
CT HEAD WITHOUT CONTRAST
TECHNIQUE: Contiguous axial images were obtained from the base of the skull
through the vertex without intravenous contrast.

[Series 2: head w/o · axial · non-contrast · 0.45mm/px · z∈[+1484,+1594]mm · 7 of 30 slices shown, 9 images]
[im 4/30  brain]
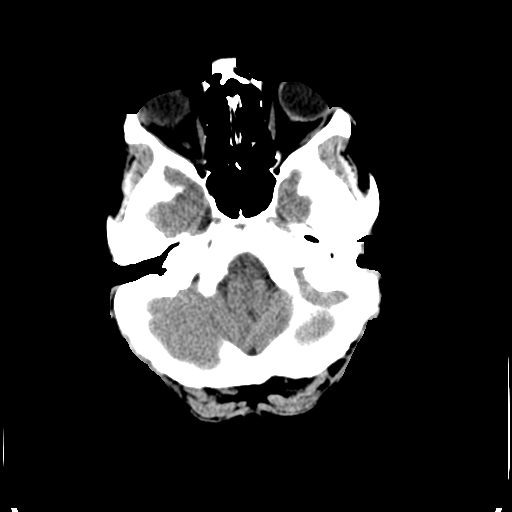
[im 4/30  bone]
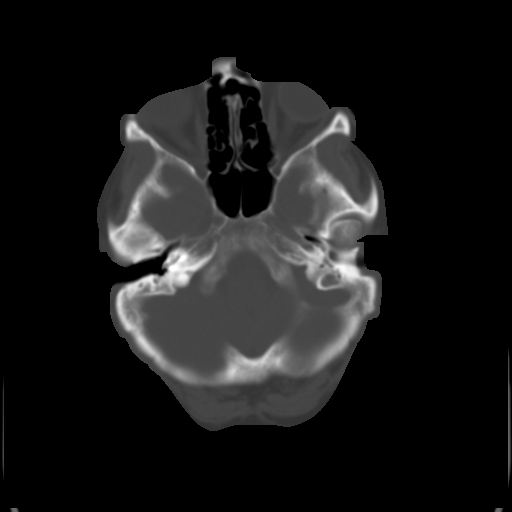
[im 8/30  brain]
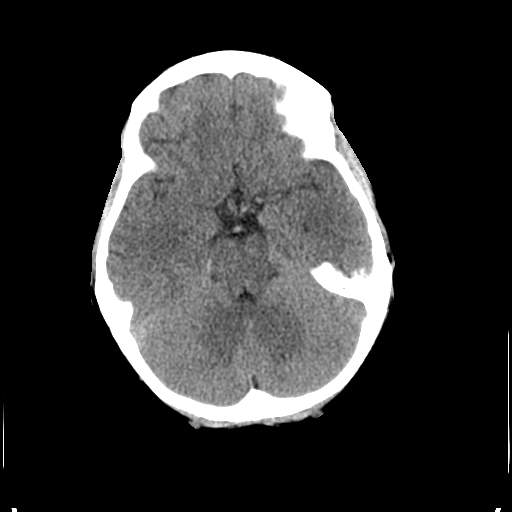
[im 11/30  brain]
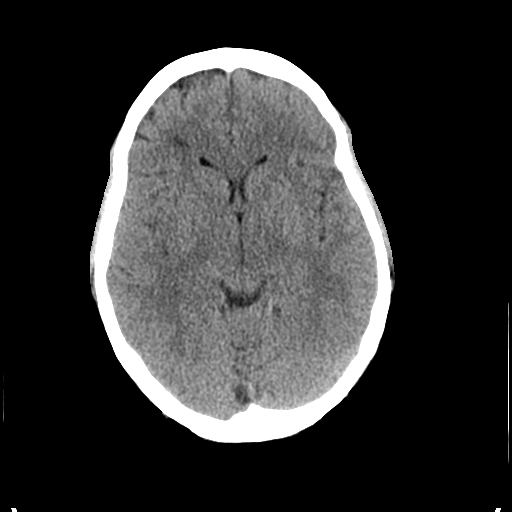
[im 15/30  brain]
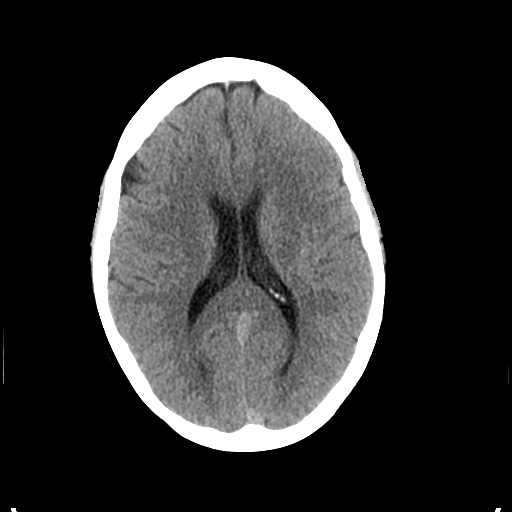
[im 19/30  brain]
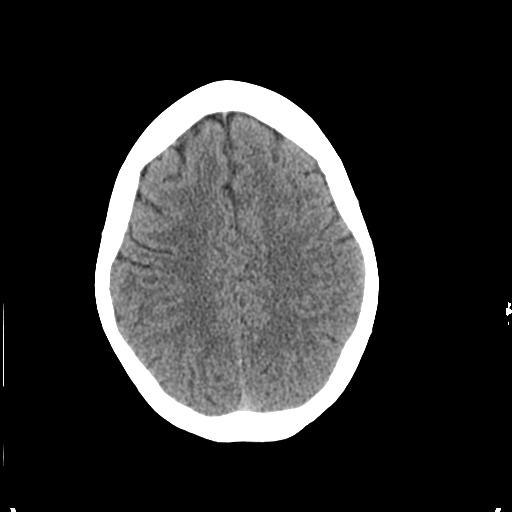
[im 19/30  bone]
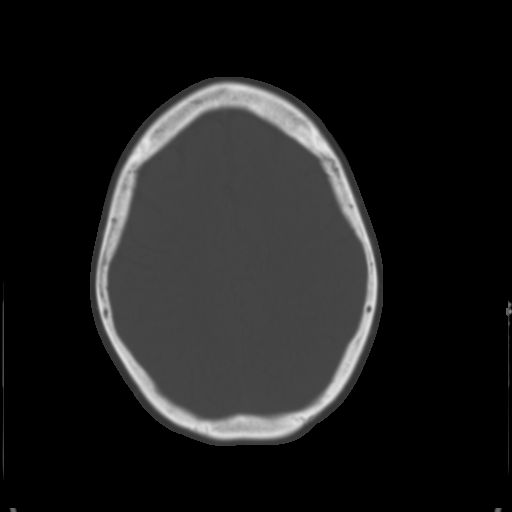
[im 22/30  brain]
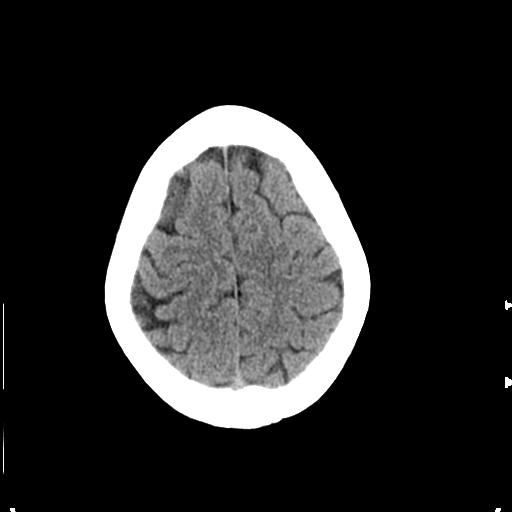
[im 26/30  brain]
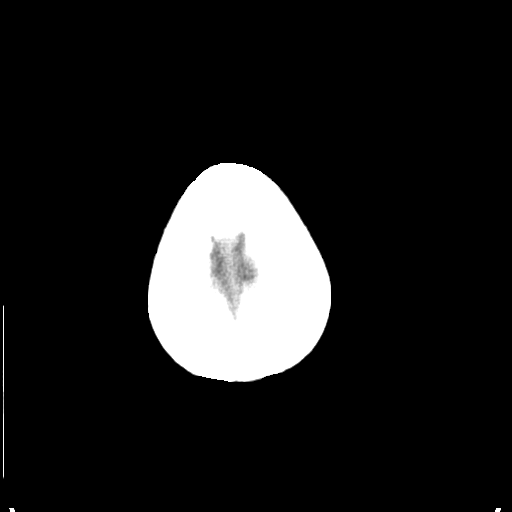

[Series 3: bone windows · axial · 0.45mm/px · z∈[+1483,+1497]mm · 2 of 75 slices shown]
[im 8/75  bone]
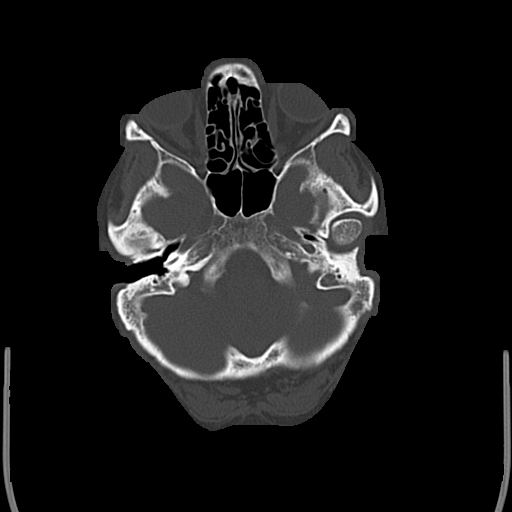
[im 15/75  bone]
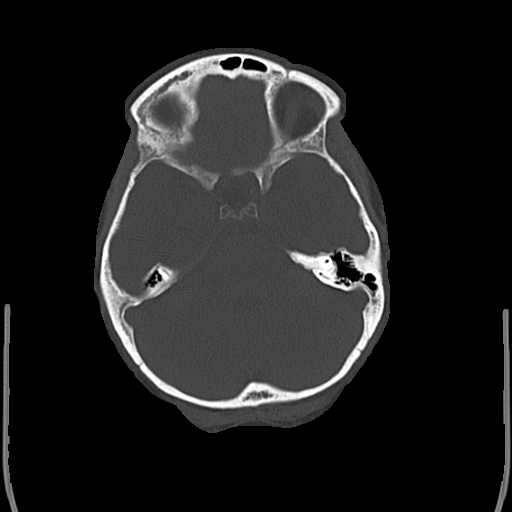

[Series 5: coronal · coronal · 0.29mm/px · 3 of 76 slices shown]
[im 26/76  brain]
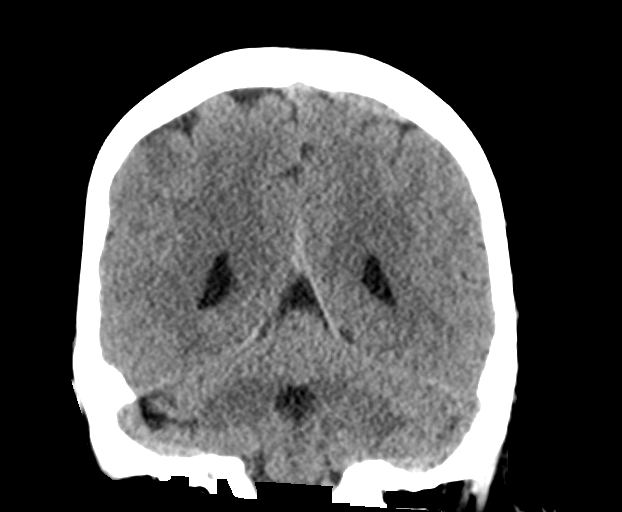
[im 34/76  brain]
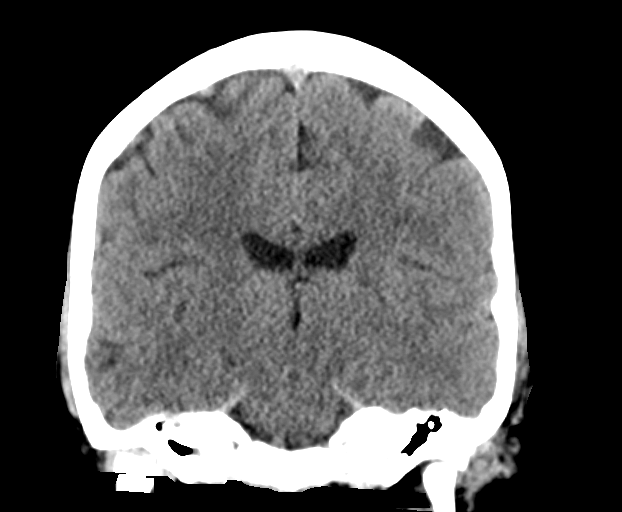
[im 42/76  brain]
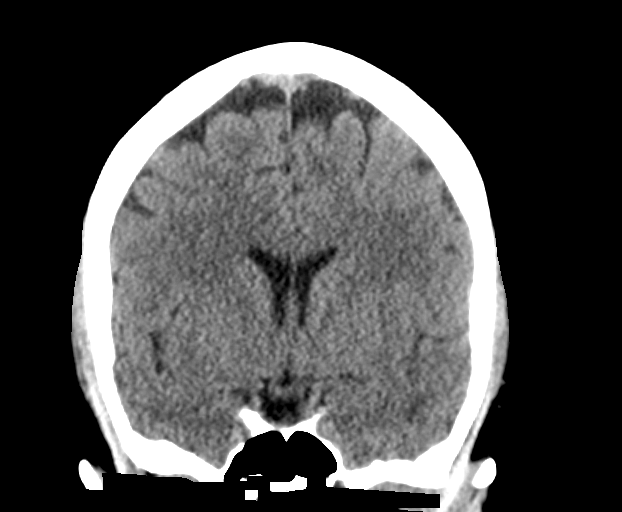

[Series 6: sagittal · sagittal · 0.29mm/px · 3 of 57 slices shown]
[im 19/57  brain]
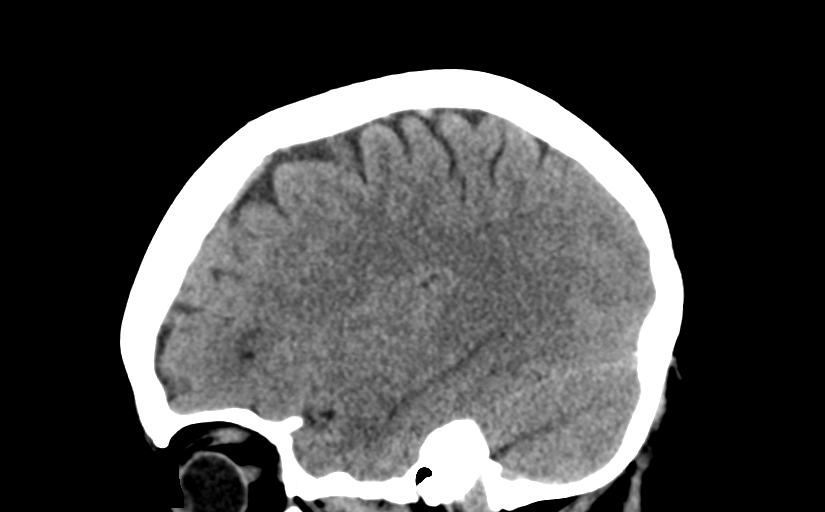
[im 29/57  brain]
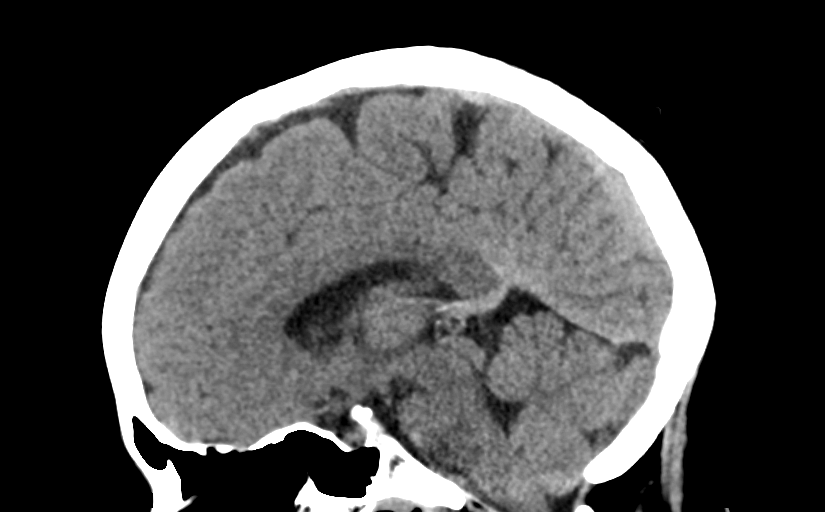
[im 38/57  brain]
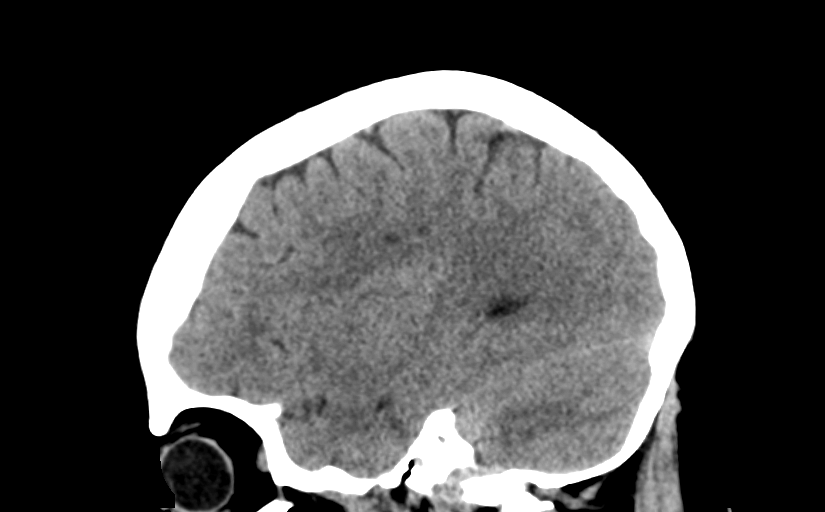

[15 of 47 positions shown; findings below may reference images not displayed]

FINDINGS: Brain: Focus of hypoattenuation is seen in the deep white matter of
the right frontal lobe consistent with evolutionary change of small
infarct seen on prior MRI. A small and remote deep white matter
infarct in the left corona radiata is also identified. No evidence
of acute infarction, hemorrhage, mass lesion, mass effect, midline
shift or abnormal extra-axial fluid collection. No hydrocephalus or
pneumocephalus.

Vascular: Unremarkable.

Skull: Intact.

Sinuses/Orbits: The patient is status post bilateral lens
extraction. Otherwise unremarkable.

Other: None.
IMPRESSION: No acute abnormality.

Remote and small deep white matter infarctions are as seen on prior
MRI.

## 2017-11-23 MED ORDER — KETOROLAC TROMETHAMINE 30 MG/ML IJ SOLN
30.0000 mg | Freq: Once | INTRAMUSCULAR | Status: AC
  Administered 2017-11-23: 30 mg via INTRAVENOUS
  Filled 2017-11-23: qty 1

## 2017-11-23 MED ORDER — TRAMADOL HCL 50 MG PO TABS
50.0000 mg | ORAL_TABLET | Freq: Once | ORAL | Status: AC
  Administered 2017-11-23: 50 mg via ORAL
  Filled 2017-11-23: qty 1

## 2017-11-23 MED ORDER — TRAMADOL HCL 50 MG PO TABS: 50.0000 mg | ORAL_TABLET | Freq: Four times a day (QID) | ORAL | 0 refills | Status: DC | PRN

## 2017-11-23 NOTE — Discharge Instructions (Signed)
Ibuprofen 600 mg 3 times daily for the next 5 days.  Tramadol as prescribed as needed for pain not relieved with ibuprofen.  Follow-up with your primary doctor if symptoms are not improving in the next 3-4 days.  Return to the ER if symptoms significantly worsen or change.

## 2017-11-23 NOTE — ED Provider Notes (Signed)
[facility] EMERGENCY DEPARTMENT Provider Note   CSN: [id number] Arrival date & time: 11/23/17  0455     History   Chief Complaint Chief Complaint  Patient presents with  . Chest Pain    HPI Summer Cook is a 52 y.o. female.  This patient is a 52 year old female with past medical history of anxiety, coronary artery disease, GERD presenting for evaluation of chest pain.  This woke her from sleep at approximately 3 AM.  The pain is sharp in nature and worse when she breathes.  She denies any shortness of breath, nausea, or diaphoresis.  She denies any recent exertional symptoms.  She had a heart cath in August 2018 revealing a very distant small artery obstruction, however no other stenotic lesions.  This did cause a small troponin leak, however was unable to be stented and managed medically.  She was recently here 12 days ago with similar complaints and negative workup.   The history is provided by the patient.  Chest Pain   This is a recurrent problem. The current episode started 1 to 2 hours ago. The problem occurs constantly. The problem has not changed since onset.The pain is present in the substernal region. The pain is moderate. The quality of the pain is described as sharp. The pain does not radiate.    Past Medical History:  Diagnosis Date  . Allergy   . Anxiety   . Asthma   . CAD (coronary artery disease), native coronary artery    a. cath 05/20/17 - 100% distal PDA --> medical mangement (no intervention done)  . Candida esophagitis ([place])   . Chest pain at rest 04/2017  . Depression   . Essential hypertension, benign   . GERD (gastroesophageal reflux disease)   . Gout   . Hemorrhoids   . 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  Last was Prednisone 74m; 2 wk in 10/2012.  She also was on Prednisone bridged with Cellcept briefly but stopped due to lack of  insurance.   . Myocardial infarction ([place]   . Pulmonary embolism ([place] 04/2012  . Renal insufficiency   . Serrated adenoma of colon   . Steroid-induced diabetes ([place]   . Stroke ([place]   . Thrush, oral 05/29/2011    Patient Active Problem List   Diagnosis Date Noted  . Unstable angina ([place] 05/29/2017  . CAD (coronary artery disease): total distal RCA EF normal (05/20/17) 05/25/2017  . UTI (urinary tract infection) 05/25/2017  . Chest pain with moderate risk for cardiac etiology 05/23/2017  . Mixed hyperlipidemia   . NSTEMI (non-ST elevated myocardial infarction) ([place] 05/20/2017  . Diabetes mellitus type 2, uncomplicated ([facility] 083/41/9622 . History of stroke   . Chronic back pain 12/23/2015  . Hypotension 02/16/2015  . Asthma, chronic 02/16/2015  . Anxiety state 02/16/2015  . CKD (chronic kidney disease) stage 3, GFR 30-59 ml/min (HCC) 04/28/2014  . Nausea with vomiting 01/25/2013  . Abnormal LFTs 01/25/2013  . Orthostatic hypotension 01/25/2013  . Rectal bleeding 12/26/2012  . History of pulmonary embolism 05/03/2012  . Hemorrhoids 06/04/2011  . 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 Immunosuppressed status ([place] 06/04/2011  . Depression 06/04/2011  . Obesity 06/04/2011  . Idiopathic thrombocytopenic purpura ([place] 03/18/2011    Past Surgical History:  Procedure Laterality Date  . BONE MARROW BIOPSY    . CARPAL TUNNEL RELEASE    . CARPAL TUNNEL RELEASE Left 05/20/2016  Procedure: CARPAL TUNNEL RELEASE;  Surgeon: Carole Civil, MD;  Location: [facility];  Service: Orthopedics;  Laterality: Left;  . CARPAL TUNNEL RELEASE Right 07/16/2016   Procedure: RIGHT CARPAL TUNNEL RELEASE;  Surgeon: Carole Civil, MD;  Location: [facility];  Service: Orthopedics;  Laterality: Right;  . CATARACT EXTRACTION    . CHOLECYSTECTOMY  2008  . COLONOSCOPY WITH ESOPHAGOGASTRODUODENOSCOPY (EGD) N/A 01/04/2013   Dr. Gala Romney: esophageal plaques with +KOH, hh. Gastric antrum abnormal, bx reactive  gastropathy. Anal canal hemorrhoids, colonic tics, normal TI, single polyp (sessile serrated tubular adenoma). Next TCS 12/2017  . LEFT HEART CATH AND CORONARY ANGIOGRAPHY N/A 05/20/2017   Procedure: LEFT HEART CATH AND CORONARY ANGIOGRAPHY;  Surgeon: Martinique, Peter M, MD;  Location: [facility];  Service: Cardiovascular;  Laterality: N/A;  . 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 TEE WITHOUT CARDIOVERSION N/A 01/03/2016   Procedure: TRANSESOPHAGEAL ECHOCARDIOGRAM (TEE) WITH PROPOFOL;  Surgeon: Satira Sark, MD;  Location: [facility];  Service: Cardiovascular;  Laterality: N/A;    OB History    No data available       Home Medications    Prior to Admission medications   Medication Sig Start Date End Date Taking? Authorizing Provider  acetaminophen (TYLENOL) 325 MG tablet Take 2 tablets (650 mg total) by mouth every 6 (six) hours as needed for mild pain (or Fever >/= 101). Can use for chest pain. 06/06/17   Debbe Odea, MD  aspirin EC 81 MG EC tablet Take 1 tablet (81 mg total) by mouth daily. 05/23/17   Eileen Stanford, PA-C  atorvastatin (LIPITOR) 80 MG tablet Take 1 tablet (80 mg total) by mouth daily at 6 PM. Patient taking differently: Take 80 mg by mouth at bedtime.  05/22/17   Eileen Stanford, PA-C  clopidogrel (PLAVIX) 75 MG tablet Take 1 tablet (75 mg total) by mouth daily. 05/23/17   Eileen Stanford, PA-C  diazepam (VALIUM) 5 MG tablet Take 1 tablet (5 mg total) by mouth 2 times daily at 12 noon and 4 pm. 08/27/17   Idol, Almyra Free, PA-C  diclofenac sodium (VOLTAREN) 1 % GEL Apply 2 g topically 4 (four) times daily. 06/06/17   Debbe Odea, MD  escitalopram (LEXAPRO) 20 MG tablet Take 20 mg by mouth daily.  05/03/17   [provider]  HYDROcodone-acetaminophen (NORCO/VICODIN) 5-325 MG tablet Take 1 tablet by mouth every 6 (six) hours as needed. 08/27/17   Evalee Jefferson, PA-C  ipratropium-albuterol (DUONEB) 0.5-2.5 (3) MG/3ML SOLN Take 3 mLs by nebulization 3 (three)  times daily as needed (shorntess of breath/wheezing). Patient taking differently: Take 3 mLs by nebulization 3 (three) times daily as needed (shortness of breath/wheezing).  02/18/15   Kathie Dike, MD  isosorbide dinitrate (ISORDIL) 10 MG tablet Take 10 mg by mouth 2 (two) times daily.    [provider]  loperamide (IMODIUM) 2 MG capsule Take 1 capsule (2 mg total) by mouth 4 (four) times daily as needed for diarrhea or loose stools. 07/30/17   Dorie Rank, MD  Multiple Vitamins-Minerals (HAIR SKIN AND NAILS FORMULA PO) Take 1 tablet by mouth daily.    [provider]  nitroGLYCERIN (NITROSTAT) 0.4 MG SL tablet Place 1 tablet (0.4 mg total) under the tongue every 5 (five) minutes x 3 doses as needed for chest pain. 05/25/17   Delos Haring, PA-C  ondansetron (ZOFRAN ODT) 8 MG disintegrating tablet Take 1 tablet (8 mg total) by mouth every 8 (eight) hours as needed  for nausea or vomiting. 07/30/17   Dorie Rank, MD  ondansetron (ZOFRAN) 4 MG tablet Take 1 tablet (4 mg total) by mouth every 6 (six) hours. 07/04/17   [name], MD  PROAIR RESPICLICK [phone] BASE) MCG/ACT AEPB Inhale 2 puffs into the lungs every 4 (four) hours as needed (Shortness of breath).  12/27/14   [provider]  prochlorperazine (COMPAZINE) 10 MG tablet Take 10 mg by mouth as needed. For nausea   12/18/11  [provider]    Family History Family History  Problem Relation Age of Onset  . Cervical cancer Mother   . Lung cancer Father   . Colon polyps Father        ???  . Pneumonia Brother   . Colon cancer Paternal Grandmother 88  . AAA (abdominal aortic aneurysm) Paternal Grandmother   . Breast cancer Paternal Grandmother   . Colon cancer Paternal Grandfather 72  . Barrett's esophagus Paternal Aunt   . Stomach cancer Neg Hx   . Pancreatic cancer Neg Hx     Social History Social History   Tobacco Use  . Smoking status: Former Smoker    Years: 0.00    Types: Cigarettes    Last  attempt to quit: 02/14/2009    Years since quitting: 8.7  . Smokeless tobacco: Never Used  Substance Use Topics  . Alcohol use: No    Alcohol/week: 0.0 oz  . Drug use: No     Allergies   Brintellix [vortioxetine]; Yellow jacket venom; Adhesive [tape]; and Zoloft [sertraline hcl]   Review of Systems Review of Systems  Cardiovascular: Positive for chest pain.  All other systems reviewed and are negative.    Physical Exam Updated Vital Signs BP (!) 131/95 (BP Location: Left Arm)   Pulse 74   Temp 98 F (36.7 C) (Oral)   Resp 18   Ht _0  (1.651 m)   Wt 84.8 kg (187 lb)   LMP 08/13/2011   SpO2 97%   BMI 31.12 kg/m   Physical Exam  Constitutional: She is oriented to person, place, and time. She appears well-developed and well-nourished. No distress.  HENT:  Head: Normocephalic and atraumatic.  Neck: Normal range of motion. Neck supple.  Cardiovascular: Normal rate and regular rhythm. Exam reveals no gallop and no friction rub.  No murmur heard. Pulmonary/Chest: Effort normal and breath sounds normal. No respiratory distress. She has no wheezes.  There is tenderness to palpation over the anterior chest wall which reproduces her symptoms.  Abdominal: Soft. Bowel sounds are normal. She exhibits no distension. There is no tenderness.  Musculoskeletal: Normal range of motion.  Neurological: She is alert and oriented to person, place, and time.  Skin: Skin is warm and dry. She is not diaphoretic.  Nursing note and vitals reviewed.    ED Treatments / Results  Labs (all labs ordered are listed, but only abnormal results are displayed) Labs Reviewed  CBC  BASIC METABOLIC PANEL  I-STAT TROPONIN, ED    EKG  EKG Interpretation  Date/Time:  Tuesday November 23 2017 05:04:19 EST Ventricular Rate:  76 PR Interval:    QRS Duration: 99 QT Interval:  393 QTC Calculation: 442 R Axis:   -32 Text Interpretation:  Sinus rhythm Left axis deviation Anterior infarct, age  indeterminate Unchanged from 11/11/2017 Confirmed by Veryl Speak [phone]) on 11/23/2017 5:12:04 AM       Radiology No results found.  Procedures Procedures (including critical care time)  Medications Ordered in ED  Medications - No data to display   Initial Impression / Assessment and Plan / ED Course  I have reviewed the triage vital signs and the nursing notes.  Pertinent labs & imaging results that were available during my care of the patient were reviewed by me and considered in my medical decision making (see chart for details).  Patient presents with complaints of chest discomfort.  It is very reproducible with palpation and deep breath.  I highly suspect her symptoms are musculoskeletal in nature.  Her cardiac workup is unremarkable including troponin and EKG.  Upon reviewing her medical record, she had a heart cath in August showing a very distal 100% occlusion, however no other blockages.  This area was not amenable to stenting and medical management was the recommendation.  I highly doubt any new blockages or stenoses have developed since that time and do not feel as though further workup into a cardiac etiology is indicated.  There is no hypoxia, tachypnea, or tachycardia.  I have considered pulmonary embolism, however I highly doubt this to be the case.  Final Clinical Impressions(s) / ED Diagnoses   Final diagnoses:  None    ED Discharge Orders    None       Veryl Speak, MD 11/23/17 [phone]

## 2017-11-23 NOTE — ED Triage Notes (Signed)
Pt c/o chest pain that started this am and right arm tingling. Pt states she took nitro at home with no relief.

## 2017-11-25 DIAGNOSIS — H5310 Unspecified subjective visual disturbances: Secondary | ICD-10-CM | POA: Diagnosis not present

## 2017-11-27 ENCOUNTER — Emergency Department (HOSPITAL_COMMUNITY): Payer: Medicare HMO

## 2017-11-27 ENCOUNTER — Encounter (HOSPITAL_COMMUNITY): Payer: Self-pay | Admitting: *Deleted

## 2017-11-27 ENCOUNTER — Emergency Department (HOSPITAL_COMMUNITY)
Admission: EM | Admit: 2017-11-27 | Discharge: 2017-11-27 | Disposition: A | Discharge status: Home / Self Care | Payer: Medicare HMO | Attending: Emergency Medicine | Admitting: Emergency Medicine

## 2017-11-27 DIAGNOSIS — N183 Chronic kidney disease, stage 3 (moderate): Secondary | ICD-10-CM | POA: Insufficient documentation

## 2017-11-27 DIAGNOSIS — I2510 Atherosclerotic heart disease of native coronary artery without angina pectoris: Secondary | ICD-10-CM | POA: Diagnosis not present

## 2017-11-27 DIAGNOSIS — R110 Nausea: Secondary | ICD-10-CM | POA: Diagnosis not present

## 2017-11-27 DIAGNOSIS — Z79899 Other long term (current) drug therapy: Secondary | ICD-10-CM | POA: Insufficient documentation

## 2017-11-27 DIAGNOSIS — H538 Other visual disturbances: Secondary | ICD-10-CM | POA: Diagnosis not present

## 2017-11-27 DIAGNOSIS — Z87891 Personal history of nicotine dependence: Secondary | ICD-10-CM | POA: Diagnosis not present

## 2017-11-27 DIAGNOSIS — H539 Unspecified visual disturbance: Secondary | ICD-10-CM

## 2017-11-27 DIAGNOSIS — Z8673 Personal history of transient ischemic attack (TIA), and cerebral infarction without residual deficits: Secondary | ICD-10-CM | POA: Diagnosis not present

## 2017-11-27 DIAGNOSIS — I129 Hypertensive chronic kidney disease with stage 1 through stage 4 chronic kidney disease, or unspecified chronic kidney disease: Secondary | ICD-10-CM | POA: Diagnosis not present

## 2017-11-27 DIAGNOSIS — I252 Old myocardial infarction: Secondary | ICD-10-CM | POA: Insufficient documentation

## 2017-11-27 DIAGNOSIS — Z7982 Long term (current) use of aspirin: Secondary | ICD-10-CM | POA: Diagnosis not present

## 2017-11-27 DIAGNOSIS — R51 Headache: Secondary | ICD-10-CM | POA: Diagnosis not present

## 2017-11-27 DIAGNOSIS — E1122 Type 2 diabetes mellitus with diabetic chronic kidney disease: Secondary | ICD-10-CM | POA: Insufficient documentation

## 2017-11-27 DIAGNOSIS — R61 Generalized hyperhidrosis: Secondary | ICD-10-CM | POA: Insufficient documentation

## 2017-11-27 DIAGNOSIS — R0789 Other chest pain: Secondary | ICD-10-CM | POA: Insufficient documentation

## 2017-11-27 DIAGNOSIS — G43109 Migraine with aura, not intractable, without status migrainosus: Secondary | ICD-10-CM | POA: Diagnosis not present

## 2017-11-27 DIAGNOSIS — J45909 Unspecified asthma, uncomplicated: Secondary | ICD-10-CM | POA: Insufficient documentation

## 2017-11-27 LAB — COMPREHENSIVE METABOLIC PANEL
ALT: 16 U/L (ref 14–54)
ANION GAP: 9 (ref 5–15)
AST: 19 U/L (ref 15–41)
Albumin: 3.6 g/dL (ref 3.5–5.0)
Alkaline Phosphatase: 66 U/L (ref 38–126)
BUN: 22 mg/dL — ABNORMAL HIGH (ref 6–20)
CHLORIDE: 110 mmol/L (ref 101–111)
CO2: 22 mmol/L (ref 22–32)
CREATININE: 2.03 mg/dL — AB (ref 0.44–1.00)
Calcium: 9 mg/dL (ref 8.9–10.3)
GFR, EST AFRICAN AMERICAN: 32 mL/min — AB (ref >60)
GFR, EST NON AFRICAN AMERICAN: 27 mL/min — AB (ref >60)
Glucose, Bld: 91 mg/dL (ref 65–99)
POTASSIUM: 4.7 mmol/L (ref 3.5–5.1)
SODIUM: 141 mmol/L (ref 135–145)
Total Bilirubin: 0.6 mg/dL (ref 0.3–1.2)
Total Protein: 6.6 g/dL (ref 6.5–8.1)

## 2017-11-27 LAB — CBC WITH DIFFERENTIAL/PLATELET
BASOS ABS: 0.1 10*3/uL (ref 0.0–0.1)
Basophils Relative: 1 %
EOS PCT: 2 %
Eosinophils Absolute: 0.2 10*3/uL (ref 0.0–0.7)
HCT: 39 % (ref 36.0–46.0)
HEMOGLOBIN: 12.5 g/dL (ref 12.0–15.0)
LYMPHS PCT: 51 %
Lymphs Abs: 4.1 10*3/uL — ABNORMAL HIGH (ref 0.7–4.0)
MCH: 29 pg (ref 26.0–34.0)
MCHC: 32.1 g/dL (ref 30.0–36.0)
MCV: 90.5 fL (ref 78.0–100.0)
Monocytes Absolute: 0.8 10*3/uL (ref 0.1–1.0)
Monocytes Relative: 10 %
NEUTROS PCT: 36 %
Neutro Abs: 3 10*3/uL (ref 1.7–7.7)
PLATELETS: 197 10*3/uL (ref 150–400)
RBC: 4.31 MIL/uL (ref 3.87–5.11)
RDW: 13.8 % (ref 11.5–15.5)
WBC: 8.1 10*3/uL (ref 4.0–10.5)

## 2017-11-27 LAB — MAGNESIUM: MAGNESIUM: 2.2 mg/dL (ref 1.7–2.4)

## 2017-11-27 LAB — TROPONIN I

## 2017-11-27 NOTE — Discharge Instructions (Signed)
Your electrocardiogram and heart enzymes are negative for acute problem.  Your oxygen level is 98% on room air.  Your kidney function is elevated.  This is slightly higher than it has been over the last few blood draws.  Please have your doctor follow-up these results.  Your blood pressure was also elevated at 142/100.  Please have this rechecked soon.  The CT scan of your head is negative for any stroke or any acute intracranial abnormality.  Please discuss your visual disturbance with your ophthalmologist as planned for next week.  Please see [name] for follow-up and assistance.  Please return to the emergency department if any emergent changes in your condition, problems, or concerns.

## 2017-11-27 NOTE — ED Triage Notes (Signed)
Pt reports seeing stars in both eyes. 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  Pt is to go back on Monday for a field of vision study at 2pm with [employer]. Pt reports right sided chest soreness that is worse upon palpation.

## 2017-11-27 NOTE — ED Notes (Signed)
EDP [place] given EKG and made aware of pt

## 2017-11-27 NOTE — ED Provider Notes (Signed)
[facility] EMERGENCY DEPARTMENT Provider Note   CSN: [id number] Arrival date & time: 11/27/17  1949     History   Chief Complaint Chief Complaint  Patient presents with  . Chest Pain    HPI Summer Cook is a 52 y.o. female.  Patient is a 52 year old female who presents to the emergency department with a complaint of chest pain and "seeing stars".  The patient states she has been having problems with her chest off and on for over 3 weeks.  On Thursday, February 28 she states the pain seem to be getting worse.  The pain is in the right upper chest.  It is described as sharp it does not radiate.  It lasts for a few minutes goes away, and comes back.  She states that at times she has sweats with this pain.  There is been no loss of consciousness.  She has had some mild nausea, but no actual vomiting.  No injury to the chest reported.  No recent operations or procedures reported.  The patient also complains of vision problems that also started recently.  She states that while watching a movie on her smart phone it was as though she had a split screen in which one side she saw stars and the other side she saw the screen on the television.  She states she had a mild headache.  She was seen by the ophthalmologist.  He told her that she may have an ocular migraine.  He wants to do another test on her this coming week.  She was told however that if the seeing stars situation react reoccurred for her to go to the emergency department for evaluation.  The patient states that she saw stars shortly before coming to the emergency department and saw them again while she was waiting in the waiting room.  Patient has a history of coronary artery disease.  A cardiac catheterization on August 2018 revealed a 100% total PDA occlusion.  The patient suffers from anxiety and depression.  She has GERD, she has been diagnosed in the past with idiopathic thrombocytopenic purpura, renal insufficiency, pulmonary  embolism, and an NSTEMI.       Past Medical History:  Diagnosis Date  . Allergy   . Anxiety   . Asthma   . CAD (coronary artery disease), native coronary artery    a. cath 05/20/17 - 100% distal PDA --> medical mangement (no intervention done)  . Candida esophagitis ([place])   . Chest pain at rest 04/2017  . Depression   . Essential hypertension, benign   . GERD (gastroesophageal reflux disease)   . Gout   . Hemorrhoids   . 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  Last was Prednisone '40mg'$ ; 2 wk in 10/2012.  She also was on Prednisone bridged with Cellcept briefly but stopped due to lack of insurance.   . Myocardial infarction ([place])   . Pulmonary embolism ([place]) 04/2012  . Renal insufficiency   . Serrated adenoma of colon   . Steroid-induced diabetes ([place])   . Stroke ([place])   . Thrush, oral 05/29/2011    Patient Active Problem List   Diagnosis Date Noted  . Unstable angina ([place]) 05/29/2017  . CAD (coronary artery disease): total distal RCA EF normal (05/20/17) 05/25/2017  . UTI (urinary tract infection) 05/25/2017  . Chest pain with moderate risk for cardiac etiology 05/23/2017  . Mixed hyperlipidemia   .  NSTEMI (non-ST elevated myocardial infarction) ([place]) 05/20/2017  . Diabetes mellitus type 2, uncomplicated ([place]) 38/75/6433  . History of stroke   . Chronic back pain 12/23/2015  . Hypotension 02/16/2015  . Asthma, chronic 02/16/2015  . Anxiety state 02/16/2015  . CKD (chronic kidney disease) stage 3, GFR 30-59 ml/min (HCC) 04/28/2014  . Nausea with vomiting 01/25/2013  . Abnormal LFTs 01/25/2013  . Orthostatic hypotension 01/25/2013  . Rectal bleeding 12/26/2012  . History of pulmonary embolism 05/03/2012  . Hemorrhoids 06/04/2011  . 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 Immunosuppressed status ([place]) 06/04/2011  . Depression 06/04/2011  . Obesity 06/04/2011  .  Idiopathic thrombocytopenic purpura ([place]) 03/18/2011    Past Surgical History:  Procedure Laterality Date  . BONE MARROW BIOPSY    . CARPAL TUNNEL RELEASE    . CARPAL TUNNEL RELEASE Left 05/20/2016   Procedure: CARPAL TUNNEL RELEASE;  Surgeon: Carole Civil, MD;  Location: [facility];  Service: Orthopedics;  Laterality: Left;  . CARPAL TUNNEL RELEASE Right 07/16/2016   Procedure: RIGHT CARPAL TUNNEL RELEASE;  Surgeon: Carole Civil, MD;  Location: [facility];  Service: Orthopedics;  Laterality: Right;  . CATARACT EXTRACTION    . CHOLECYSTECTOMY  2008  . COLONOSCOPY WITH ESOPHAGOGASTRODUODENOSCOPY (EGD) N/A 01/04/2013   Dr. Gala Romney: esophageal plaques with +KOH, hh. Gastric antrum abnormal, bx reactive gastropathy. Anal canal hemorrhoids, colonic tics, normal TI, single polyp (sessile serrated tubular adenoma). Next TCS 12/2017  . LEFT HEART CATH AND CORONARY ANGIOGRAPHY N/A 05/20/2017   Procedure: LEFT HEART CATH AND CORONARY ANGIOGRAPHY;  Surgeon: Martinique, Peter M, MD;  Location: [facility];  Service: Cardiovascular;  Laterality: N/A;  . 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 TEE WITHOUT CARDIOVERSION N/A 01/03/2016   Procedure: TRANSESOPHAGEAL ECHOCARDIOGRAM (TEE) WITH PROPOFOL;  Surgeon: Satira Sark, MD;  Location: [facility];  Service: Cardiovascular;  Laterality: N/A;    OB History    No data available       Home Medications    Prior to Admission medications   Medication Sig Start Date End Date Taking? Authorizing Provider  ALPRAZolam Duanne Moron) 1 MG tablet Take 1 tablet by mouth 3 (three) times daily. 11/02/17  Yes [provider]  aspirin EC 81 MG EC tablet Take 1 tablet (81 mg total) by mouth daily. 05/23/17  Yes Eileen Stanford, PA-C  diclofenac sodium (VOLTAREN) 1 % GEL Apply 2 g topically 4 (four) times daily. 06/06/17  Yes Debbe Odea, MD  escitalopram (LEXAPRO) 20 MG tablet Take 20 mg by mouth daily.  05/03/17  Yes [provider]    HYDROcodone-acetaminophen (NORCO) 10-325 MG tablet Take 1 tablet by mouth every 4 (four) hours as needed. 11/02/17  Yes [provider]  ipratropium-albuterol (DUONEB) 0.5-2.5 (3) MG/3ML SOLN Take 3 mLs by nebulization 3 (three) times daily as needed (shorntess of breath/wheezing). Patient taking differently: Take 3 mLs by nebulization 3 (three) times daily as needed (shortness of breath/wheezing).  02/18/15  Yes Kathie Dike, MD  isosorbide dinitrate (ISORDIL) 10 MG tablet Take 10 mg by mouth daily.    Yes [provider]  PROAIR RESPICLICK 295 (90 BASE) MCG/ACT AEPB Inhale 2 puffs into the lungs every 4 (four) hours as needed (Shortness of breath).  12/27/14  Yes [provider]  SYMBICORT 160-4.5 MCG/ACT inhaler Inhale 2 puffs into the lungs 2 (two) times daily. 10/29/17  Yes [provider]  nitroGLYCERIN (NITROSTAT) 0.4 MG SL tablet Place 1 tablet (0.4 mg total) under the tongue every  5 (five) minutes x 3 doses as needed for chest pain. 05/25/17   Delos Haring, PA-C  traMADol (ULTRAM) 50 MG tablet Take 1 tablet (50 mg total) by mouth every 6 (six) hours as needed. Patient not taking: Reported on 11/27/2017 11/23/17   Veryl Speak, MD  prochlorperazine (COMPAZINE) 10 MG tablet Take 10 mg by mouth as needed. For nausea   12/18/11  [provider]    Family History Family History  Problem Relation Age of Onset  . Cervical cancer Mother   . Lung cancer Father   . Colon polyps Father        ???  . Pneumonia Brother   . Colon cancer Paternal Grandmother 88  . AAA (abdominal aortic aneurysm) Paternal Grandmother   . Breast cancer Paternal Grandmother   . Colon cancer Paternal Grandfather 82  . Barrett's esophagus Paternal Aunt   . Stomach cancer Neg Hx   . Pancreatic cancer Neg Hx     Social History Social History   Tobacco Use  . Smoking status: Former Smoker    Years: 0.00    Types: Cigarettes    Last attempt to quit: 02/14/2009    Years  since quitting: 8.7  . Smokeless tobacco: Never Used  Substance Use Topics  . Alcohol use: No    Alcohol/week: 0.0 oz  . Drug use: No     Allergies   Brintellix [vortioxetine]; Yellow jacket venom; Adhesive [tape]; and Zoloft [sertraline hcl]   Review of Systems Review of Systems  Constitutional: Negative for activity change.       All ROS Neg except as noted in HPI  HENT: Negative for nosebleeds.   Eyes: Positive for visual disturbance. Negative for photophobia and discharge.  Respiratory: Negative for cough, shortness of breath and wheezing.   Cardiovascular: Positive for chest pain. Negative for palpitations.  Gastrointestinal: Negative for abdominal pain and blood in stool.  Genitourinary: Negative for dysuria, frequency and hematuria.  Musculoskeletal: Negative for arthralgias, back pain and neck pain.  Skin: Negative.   Neurological: Negative for dizziness, seizures and speech difficulty.  Psychiatric/Behavioral: Negative for confusion and hallucinations. The patient is nervous/anxious.      Physical Exam Updated Vital Signs BP (!) 142/100 (BP Location: Right Arm)   Pulse 65   Temp 98.9 F (37.2 C) (Oral)   Resp 16   Ht '5\' 5"'$  (1.651 m)   Wt 84.8 kg (187 lb)   LMP 08/13/2011   SpO2 98%   BMI 31.12 kg/m   Physical Exam  Constitutional: She is oriented to person, place, and time. She appears well-developed and well-nourished.  Non-toxic appearance. No distress.  HENT:  Head: Normocephalic and atraumatic.  Right Ear: Tympanic membrane and external ear normal.  Left Ear: Tympanic membrane and external ear normal.  Eyes: Conjunctivae, EOM and lids are normal. Pupils are equal, round, and reactive to light. Right eye exhibits no discharge. Left eye exhibits no discharge. No scleral icterus.  Fundoscopic exam:      The right eye shows no AV nicking, no exudate, no hemorrhage and no papilledema.       The left eye shows no AV nicking, no exudate, no hemorrhage and no  papilledema.  Neck: Normal range of motion. Neck supple. Carotid bruit is not present. No tracheal deviation present.  Cardiovascular: Normal rate, regular rhythm, normal heart sounds, intact distal pulses and normal pulses.    Pulmonary/Chest: Effort normal and breath sounds normal. No stridor. No respiratory distress.  Abdominal: Soft.  Bowel sounds are normal. She exhibits no distension. There is no tenderness. There is no guarding.  Musculoskeletal: Normal range of motion. She exhibits no edema.  Lymphadenopathy:       Head (right side): No submandibular adenopathy present.       Head (left side): No submandibular adenopathy present.    She has no cervical adenopathy.  Neurological: She is alert and oriented to person, place, and time. She has normal strength. No cranial nerve deficit or sensory deficit.  Skin: Skin is warm and dry. No rash noted.  Psychiatric: Her speech is normal. Her mood appears anxious.  Nursing note and vitals reviewed.    ED Treatments / Results  Labs (all labs ordered are listed, but only abnormal results are displayed) Labs Reviewed  CBC WITH DIFFERENTIAL/PLATELET - Abnormal; Notable for the following components:      Result Value   Lymphs Abs 4.1 (*)    All other components within normal limits  COMPREHENSIVE METABOLIC PANEL  TROPONIN I  URINALYSIS, ROUTINE W REFLEX MICROSCOPIC  MAGNESIUM  CBG MONITORING, ED    EKG  EKG Interpretation None       Radiology No results found.  Procedures Procedures (including critical care time)  Medications Ordered in ED Medications - No data to display   Initial Impression / Assessment and Plan / ED Course  I have reviewed the triage vital signs and the nursing notes.  Pertinent labs & imaging results that were available during my care of the patient were reviewed by me and considered in my medical decision making (see chart for details).       Final Clinical Impressions(s) / ED  Diagnoses  MDM Blood pressure is elevated at 142/100, otherwise vital signs within normal limits.  Pulse oximetry is 98% on room air.  Within normal limits by my interpretation. CBG ordered.  Wells criteria for pulmonary embolism shows the patient to be an low risk group. Neg Homan's sign. Pulse ox 98% on room air.   Doubt pulmonary embolism at this time.  EKG - NSR, No STEMI. Troponin neg for acute event.  Chest pain reproduced with palpation to the right upper chest wall.  No acute rhythm changes noted on the monitor.  Comprehensive metabolic panel shows the BUN to be elevated at 22, the creatinine elevated at 2.03.  Patient has a history of renal insufficiency.  The creatinine is slightly higher than it has been during the last 2 or 3 blood draws at this facility.  The anion gap is normal at 9. The complete blood count is well within normal limits.  The magnesium is normal at 2.2.  The CT scan is negative for any acute intracranial abnormality.  I have discussed the findings with the patient in terms of which she understands.  I have answered questions.  I have suggested to the patient to see the ophthalmologist as scheduled next week concerning the seeing stars and visual changes.  I have asked her to return to the emergency department immediately if any changes in her condition, chest pain, shortness of breath, or any acute abnormalities.  The patient acknowledges these instructions and is in agreement with discharge instructions.      Final diagnoses:  Right-sided chest wall pain  Visual disturbance    ED Discharge Orders    None       Lily Kocher, Hershal Coria 11/27/17 2227    Julianne Rice, MD 11/27/17 2340

## 2017-11-29 DIAGNOSIS — H5310 Unspecified subjective visual disturbances: Secondary | ICD-10-CM | POA: Diagnosis not present

## 2017-11-29 LAB — CBG MONITORING, ED: Glucose-Capillary: 65 mg/dL (ref 65–99)

## 2017-12-08 DIAGNOSIS — Z6830 Body mass index (BMI) 30.0-30.9, adult: Secondary | ICD-10-CM | POA: Diagnosis not present

## 2017-12-08 DIAGNOSIS — N342 Other urethritis: Secondary | ICD-10-CM | POA: Diagnosis not present

## 2017-12-08 DIAGNOSIS — E6609 Other obesity due to excess calories: Secondary | ICD-10-CM | POA: Diagnosis not present

## 2017-12-08 DIAGNOSIS — Z1389 Encounter for screening for other disorder: Secondary | ICD-10-CM | POA: Diagnosis not present

## 2017-12-11 ENCOUNTER — Encounter (HOSPITAL_COMMUNITY): Payer: Self-pay | Admitting: Emergency Medicine

## 2017-12-11 ENCOUNTER — Emergency Department (HOSPITAL_COMMUNITY)
Admission: EM | Admit: 2017-12-11 | Discharge: 2017-12-11 | Disposition: A | Discharge status: Home / Self Care | Payer: Medicare HMO | Attending: Emergency Medicine | Admitting: Emergency Medicine

## 2017-12-11 ENCOUNTER — Other Ambulatory Visit: Payer: Self-pay

## 2017-12-11 ENCOUNTER — Emergency Department (HOSPITAL_COMMUNITY): Payer: Medicare HMO

## 2017-12-11 DIAGNOSIS — Z23 Encounter for immunization: Secondary | ICD-10-CM | POA: Insufficient documentation

## 2017-12-11 DIAGNOSIS — I2510 Atherosclerotic heart disease of native coronary artery without angina pectoris: Secondary | ICD-10-CM | POA: Diagnosis not present

## 2017-12-11 DIAGNOSIS — Z8673 Personal history of transient ischemic attack (TIA), and cerebral infarction without residual deficits: Secondary | ICD-10-CM | POA: Diagnosis not present

## 2017-12-11 DIAGNOSIS — N183 Chronic kidney disease, stage 3 (moderate): Secondary | ICD-10-CM | POA: Diagnosis not present

## 2017-12-11 DIAGNOSIS — S61432A Puncture wound without foreign body of left hand, initial encounter: Secondary | ICD-10-CM | POA: Insufficient documentation

## 2017-12-11 DIAGNOSIS — Z87891 Personal history of nicotine dependence: Secondary | ICD-10-CM | POA: Insufficient documentation

## 2017-12-11 DIAGNOSIS — I129 Hypertensive chronic kidney disease with stage 1 through stage 4 chronic kidney disease, or unspecified chronic kidney disease: Secondary | ICD-10-CM | POA: Diagnosis not present

## 2017-12-11 DIAGNOSIS — E1122 Type 2 diabetes mellitus with diabetic chronic kidney disease: Secondary | ICD-10-CM | POA: Diagnosis not present

## 2017-12-11 DIAGNOSIS — Z7982 Long term (current) use of aspirin: Secondary | ICD-10-CM | POA: Diagnosis not present

## 2017-12-11 DIAGNOSIS — Z203 Contact with and (suspected) exposure to rabies: Secondary | ICD-10-CM | POA: Insufficient documentation

## 2017-12-11 DIAGNOSIS — Z79899 Other long term (current) drug therapy: Secondary | ICD-10-CM | POA: Insufficient documentation

## 2017-12-11 DIAGNOSIS — W540XXA Bitten by dog, initial encounter: Secondary | ICD-10-CM | POA: Diagnosis not present

## 2017-12-11 DIAGNOSIS — S6982XA Other specified injuries of left wrist, hand and finger(s), initial encounter: Secondary | ICD-10-CM | POA: Diagnosis present

## 2017-12-11 DIAGNOSIS — Y9389 Activity, other specified: Secondary | ICD-10-CM | POA: Diagnosis not present

## 2017-12-11 DIAGNOSIS — Y92830 Public park as the place of occurrence of the external cause: Secondary | ICD-10-CM | POA: Insufficient documentation

## 2017-12-11 DIAGNOSIS — I252 Old myocardial infarction: Secondary | ICD-10-CM | POA: Insufficient documentation

## 2017-12-11 DIAGNOSIS — J45909 Unspecified asthma, uncomplicated: Secondary | ICD-10-CM | POA: Diagnosis not present

## 2017-12-11 DIAGNOSIS — Y999 Unspecified external cause status: Secondary | ICD-10-CM | POA: Insufficient documentation

## 2017-12-11 DIAGNOSIS — S61452A Open bite of left hand, initial encounter: Secondary | ICD-10-CM

## 2017-12-11 MED ORDER — AMOXICILLIN-POT CLAVULANATE 875-125 MG PO TABS: 1.0000 | ORAL_TABLET | Freq: Two times a day (BID) | ORAL | 0 refills | Status: DC

## 2017-12-11 MED ORDER — AMOXICILLIN-POT CLAVULANATE 875-125 MG PO TABS
1.0000 | ORAL_TABLET | Freq: Once | ORAL | Status: AC
  Administered 2017-12-11: 1 via ORAL
  Filled 2017-12-11: qty 1

## 2017-12-11 MED ORDER — RABIES VACCINE, PCEC IM SUSR
1.0000 mL | Freq: Once | INTRAMUSCULAR | Status: AC
  Administered 2017-12-11: 1 mL via INTRAMUSCULAR
  Filled 2017-12-11: qty 1

## 2017-12-11 MED ORDER — RABIES IMMUNE GLOBULIN 150 UNIT/ML IM INJ
20.0000 [IU]/kg | INJECTION | Freq: Once | INTRAMUSCULAR | Status: AC
  Administered 2017-12-11: 1650 [IU] via INTRAMUSCULAR
  Filled 2017-12-11: qty 11

## 2017-12-11 MED ORDER — RABIES IMMUNE GLOBULIN 150 UNIT/ML IM INJ
INJECTION | INTRAMUSCULAR | Status: AC
  Administered 2017-12-11: 1650 [IU] via INTRAMUSCULAR
  Filled 2017-12-11: qty 12

## 2017-12-11 NOTE — Discharge Instructions (Signed)
Apply ice packs on and off to your hand and keep it elevated when possible.  Use the Ace wrap for mild compression to the area that is swollen.  Return here in 2 days for recheck or sooner if any signs of infection such as increasing redness, red streaks, fever, chills, or increasing pain and difficulty moving your fingers.

## 2017-12-11 NOTE — ED Provider Notes (Signed)
[facility] EMERGENCY DEPARTMENT Provider Note   CSN: [id number] Arrival date & time: 12/11/17  1317     History   Chief Complaint Chief Complaint  Patient presents with  . Animal Bite    HPI Summer Cook is a 52 y.o. female.  HPI   Summer Cook is a 52 y.o. female who presents to the Emergency Department complaining of pain and swelling of her left hand.  She states that she reached down to pet a dog at a local park and the dog bit her hand.  She reports swelling and bruising of her knuckles.  She is applied ice with minimal relief.  She states the dog was on a leash with its owner, but she did not get any information regarding the dog's immunizations or the owners contact information.  Incident occurred around 1130 today.  She complains of some soreness to her hand with movement of her fingers.  She denies numbness or weakness of the fingers, wrist pain, and pain of the forearm.  Td is up-to-date.   Past Medical History:  Diagnosis Date  . Allergy   . Anxiety   . Asthma   . CAD (coronary artery disease), native coronary artery    a. cath 05/20/17 - 100% distal PDA --> medical mangement (no intervention done)  . Candida esophagitis ([place])   . Chest pain at rest 04/2017  . Depression   . Essential hypertension, benign   . GERD (gastroesophageal reflux disease)   . Gout   . Hemorrhoids   . 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  Last was Prednisone '40mg'$ ; 2 wk in 10/2012.  She also was on Prednisone bridged with Cellcept briefly but stopped due to lack of insurance.   . Myocardial infarction ([place])   . Pulmonary embolism ([place]) 04/2012  . Renal insufficiency   . Serrated adenoma of colon   . Steroid-induced diabetes ([place])   . Stroke ([place])   . Thrush, oral 05/29/2011    Patient Active Problem List   Diagnosis Date Noted  . Unstable angina ([place]) 05/29/2017  .  CAD (coronary artery disease): total distal RCA EF normal (05/20/17) 05/25/2017  . UTI (urinary tract infection) 05/25/2017  . Chest pain with moderate risk for cardiac etiology 05/23/2017  . Mixed hyperlipidemia   . NSTEMI (non-ST elevated myocardial infarction) ([place]) 05/20/2017  . Diabetes mellitus type 2, uncomplicated ([place]) 24/58/0998  . History of stroke   . Chronic back pain 12/23/2015  . Hypotension 02/16/2015  . Asthma, chronic 02/16/2015  . Anxiety state 02/16/2015  . CKD (chronic kidney disease) stage 3, GFR 30-59 ml/min (HCC) 04/28/2014  . Nausea with vomiting 01/25/2013  . Abnormal LFTs 01/25/2013  . Orthostatic hypotension 01/25/2013  . Rectal bleeding 12/26/2012  . History of pulmonary embolism 05/03/2012  . Hemorrhoids 06/04/2011  . 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 Immunosuppressed status ([place]) 06/04/2011  . Depression 06/04/2011  . Obesity 06/04/2011  . Idiopathic thrombocytopenic purpura ([place]) 03/18/2011    Past Surgical History:  Procedure Laterality Date  . BONE MARROW BIOPSY    . CARPAL TUNNEL RELEASE    . CARPAL TUNNEL RELEASE Left 05/20/2016   Procedure: CARPAL TUNNEL RELEASE;  Surgeon: Carole Civil, MD;  Location: [facility];  Service: Orthopedics;  Laterality: Left;  . CARPAL TUNNEL RELEASE Right 07/16/2016   Procedure: RIGHT CARPAL TUNNEL RELEASE;  Surgeon: Carole Civil, MD;  Location: [facility];  Service:  Orthopedics;  Laterality: Right;  . CATARACT EXTRACTION    . CHOLECYSTECTOMY  2008  . COLONOSCOPY WITH ESOPHAGOGASTRODUODENOSCOPY (EGD) N/A 01/04/2013   Dr. Gala Romney: esophageal plaques with +KOH, hh. Gastric antrum abnormal, bx reactive gastropathy. Anal canal hemorrhoids, colonic tics, normal TI, single polyp (sessile serrated tubular adenoma). Next TCS 12/2017  . LEFT HEART CATH AND CORONARY ANGIOGRAPHY N/A 05/20/2017   Procedure: LEFT HEART CATH AND CORONARY ANGIOGRAPHY;  Surgeon: Martinique, Peter M, MD;  Location: [facility];  Service:  Cardiovascular;  Laterality: N/A;  . 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 TEE WITHOUT CARDIOVERSION N/A 01/03/2016   Procedure: TRANSESOPHAGEAL ECHOCARDIOGRAM (TEE) WITH PROPOFOL;  Surgeon: Satira Sark, MD;  [facility];  Service: Cardiovascular;  Laterality: N/A;    OB History    Gravida Para Term Preterm AB Living             1   SAB TAB Ectopic Multiple Live Births                   Home Medications    Prior to Admission medications   Medication Sig Start Date End Date Taking? Authorizing Provider  ALPRAZolam Duanne Moron) 1 MG tablet Take 1 tablet by mouth 3 (three) times daily. 11/02/17   [provider]  aspirin EC 81 MG EC tablet Take 1 tablet (81 mg total) by mouth daily. 05/23/17   Eileen Stanford, PA-C  diclofenac sodium (VOLTAREN) 1 % GEL Apply 2 g topically 4 (four) times daily. 06/06/17   Debbe Odea, MD  escitalopram (LEXAPRO) 20 MG tablet Take 20 mg by mouth daily.  05/03/17   [provider]  HYDROcodone-acetaminophen (NORCO) 10-325 MG tablet Take 1 tablet by mouth every 4 (four) hours as needed. 11/02/17   [provider]  ipratropium-albuterol (DUONEB) 0.5-2.5 (3) MG/3ML SOLN Take 3 mLs by nebulization 3 (three) times daily as needed (shorntess of breath/wheezing). Patient taking differently: Take 3 mLs by nebulization 3 (three) times daily as needed (shortness of breath/wheezing).  02/18/15   Kathie Dike, MD  isosorbide dinitrate (ISORDIL) 10 MG tablet Take 10 mg by mouth daily.     [provider]  nitroGLYCERIN (NITROSTAT) 0.4 MG SL tablet Place 1 tablet (0.4 mg total) under the tongue every 5 (five) minutes x 3 doses as needed for chest pain. 05/25/17   Delos Haring, PA-C  PROAIR RESPICLICK 979 (90 BASE) MCG/ACT AEPB Inhale 2 puffs into the lungs every 4 (four) hours as needed (Shortness of breath).  12/27/14   [provider]  SYMBICORT 160-4.5 MCG/ACT inhaler Inhale 2 puffs into the lungs 2 (two) times daily. 10/29/17    [provider]  traMADol (ULTRAM) 50 MG tablet Take 1 tablet (50 mg total) by mouth every 6 (six) hours as needed. Patient not taking: Reported on 11/27/2017 11/23/17   Veryl Speak, MD  prochlorperazine (COMPAZINE) 10 MG tablet Take 10 mg by mouth as needed. For nausea   12/18/11  [provider]    Family History Family History  Problem Relation Age of Onset  . Cervical cancer Mother   . Lung cancer Father   . Colon polyps Father        ???  . Pneumonia Brother   . Colon cancer Paternal Grandmother 88  . AAA (abdominal aortic aneurysm) Paternal Grandmother   . Breast cancer Paternal Grandmother   . Colon cancer Paternal Grandfather 34  . Barrett's esophagus Paternal Aunt   . Stomach cancer Neg  Hx   . Pancreatic cancer Neg Hx     Social History Social History   Tobacco Use  . Smoking status: Former Smoker    Years: 0.00    Types: Cigarettes    Last attempt to quit: 02/14/2009    Years since quitting: 8.8  . Smokeless tobacco: Never Used  Substance Use Topics  . Alcohol use: No    Alcohol/week: 0.0 oz  . Drug use: No     Allergies   Brintellix [vortioxetine]; Yellow jacket venom; Adhesive [tape]; and Zoloft [sertraline hcl]   Review of Systems Review of Systems  Constitutional: Negative for chills and fever.  Musculoskeletal: Negative for arthralgias and back pain.  Skin: Positive for wound.       Dog bite left hand  Neurological: Negative for dizziness, weakness and numbness.  Hematological: Does not bruise/bleed easily.  All other systems reviewed and are negative.    Physical Exam Updated Vital Signs BP 103/89 (BP Location: Right Arm)   Pulse 100   Temp 99 F (37.2 C) (Oral)   Resp 18   Ht '5\' 5"'$  (1.651 m)   Wt 83.9 kg (185 lb)   LMP 08/13/2011   SpO2 95%   BMI 30.79 kg/m   Physical Exam  Constitutional: She is oriented to person, place, and time. She appears well-developed and well-nourished. No distress.  HENT:  Head:  Normocephalic and atraumatic.  Cardiovascular: Normal rate, regular rhythm and intact distal pulses.  No murmur heard. Pulmonary/Chest: Effort normal and breath sounds normal. No respiratory distress.  Musculoskeletal: Normal range of motion. She exhibits tenderness. She exhibits no edema.  Three small puncture wounds of the dorsal left hand.  Bleeding control.  Hematoma noted to the skin overlying the MCP's of the second and third fingers.  Neurological: She is alert and oriented to person, place, and time. She exhibits normal muscle tone. Coordination normal.  Skin: Skin is warm. Capillary refill takes less than 2 seconds.  Nursing note and vitals reviewed.  Patient gives verbal consent for photo to be placed in the medical record.  She understands that imaging is for medical purposes only and no photos will be stored on any digital devices.        ED Treatments / Results  Labs (all labs ordered are listed, but only abnormal results are displayed) Labs Reviewed - No data to display  EKG  EKG Interpretation None       Radiology Dg Hand Complete Left  Result Date: 12/11/2017 CLINICAL DATA:  Dog bite to the left hand.  Initial encounter. EXAM: LEFT HAND - COMPLETE 3+ VIEW COMPARISON:  None. FINDINGS: No evidence of acute fracture or dislocation. Mild narrowing of the IP joints of the index, long, ring and small fingers. Remaining joint spaces well-preserved. Bone mineral density relatively well preserved for age. No opaque foreign bodies in the soft tissues. Soft tissue swelling/hematoma involving the dorsal soft tissues overlying the metacarpal heads. IMPRESSION: 1. No acute osseous abnormality. 2. 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 Electronically Signed   By: Evangeline Dakin M.D.   On: 12/11/2017 15:15    Procedures Procedures (including critical care time)  Medications Ordered in ED Medications - No data to display   Initial Impression / Assessment  and Plan / ED Course  I have reviewed the triage vital signs and the nursing notes.  Pertinent labs & imaging results that were available during my care of the patient were reviewed by me and considered  in my medical decision making (see chart for details).    Td up to date  Neurovascularly intact.  No motor deficits or weakness of the left hand.  Dorsal edema likely related to hematoma.  Patient does admit to taking aspirin daily.  Bleeding is controlled.  Will initiate rabies protocol since vaccination info is unobtainable.   Wounds cleaned and bandaged.  ACE wrap also applied for compression.  Pt agrees to tx plan, rx for augmentin and ER return in 2 days or sooner for any signs of infection   Final Clinical Impressions(s) / ED Diagnoses   Final diagnoses:  Dog bite of left hand, initial encounter    ED Discharge Orders    None       Kem Parkinson, PA-C 12/11/17 1627    Mesner, Corene Cornea, MD 12/11/17 1642

## 2017-12-11 NOTE — ED Triage Notes (Addendum)
PT states [place] was in [place] today and went to pet a dog that was with its owner on its leash and it hit her left hand. PT c/o pain and swelling with bruising to left hand. PT states she didn't get the owner's contact information and did not report it to police.

## 2017-12-14 ENCOUNTER — Encounter (HOSPITAL_COMMUNITY): Payer: Self-pay

## 2017-12-14 ENCOUNTER — Encounter (HOSPITAL_COMMUNITY)
Admission: RE | Admit: 2017-12-14 | Discharge: 2017-12-14 | Disposition: A | Discharge status: Home / Self Care | Payer: Medicare HMO | Source: Ambulatory Visit | Attending: Emergency Medicine | Admitting: Emergency Medicine

## 2017-12-14 DIAGNOSIS — Z2914 Encounter for prophylactic rabies immune globin: Secondary | ICD-10-CM | POA: Insufficient documentation

## 2017-12-14 DIAGNOSIS — N179 Acute kidney failure, unspecified: Secondary | ICD-10-CM | POA: Diagnosis not present

## 2017-12-14 DIAGNOSIS — S61432A Puncture wound without foreign body of left hand, initial encounter: Secondary | ICD-10-CM | POA: Diagnosis not present

## 2017-12-14 DIAGNOSIS — Z23 Encounter for immunization: Secondary | ICD-10-CM | POA: Diagnosis present

## 2017-12-14 DIAGNOSIS — W540XXA Bitten by dog, initial encounter: Secondary | ICD-10-CM | POA: Insufficient documentation

## 2017-12-14 DIAGNOSIS — Z203 Contact with and (suspected) exposure to rabies: Secondary | ICD-10-CM | POA: Insufficient documentation

## 2017-12-14 DIAGNOSIS — I5023 Acute on chronic systolic (congestive) heart failure: Secondary | ICD-10-CM | POA: Diagnosis not present

## 2017-12-14 MED ORDER — RABIES VACCINE, PCEC IM SUSR
1.0000 mL | Freq: Once | INTRAMUSCULAR | Status: AC
  Administered 2017-12-14: 1 mL via INTRAMUSCULAR

## 2017-12-14 NOTE — Progress Notes (Signed)
Patient arrived for 1/3 doses of rabies vaccine.  Given in Left Deltoid.  No acute distress or complaints noted.  To receive 2 subsequent doses in the ED on 12/18/2017 and 12/25/2017.  Orders are in [place].

## 2017-12-18 ENCOUNTER — Encounter (HOSPITAL_COMMUNITY): Payer: Medicare HMO

## 2017-12-25 ENCOUNTER — Encounter (HOSPITAL_COMMUNITY): Admission: RE | Admit: 2017-12-25 | Payer: Medicare HMO | Source: Ambulatory Visit

## 2018-02-12 ENCOUNTER — Encounter (HOSPITAL_COMMUNITY): Payer: Self-pay | Admitting: Emergency Medicine

## 2018-02-12 ENCOUNTER — Emergency Department (HOSPITAL_COMMUNITY)
Admission: EM | Admit: 2018-02-12 | Discharge: 2018-02-12 | Disposition: A | Discharge status: Home / Self Care | Payer: Medicare HMO | Attending: Emergency Medicine | Admitting: Emergency Medicine

## 2018-02-12 ENCOUNTER — Emergency Department (HOSPITAL_COMMUNITY): Payer: Medicare HMO

## 2018-02-12 ENCOUNTER — Other Ambulatory Visit: Payer: Self-pay

## 2018-02-12 DIAGNOSIS — Z87891 Personal history of nicotine dependence: Secondary | ICD-10-CM | POA: Diagnosis not present

## 2018-02-12 DIAGNOSIS — Z79899 Other long term (current) drug therapy: Secondary | ICD-10-CM | POA: Insufficient documentation

## 2018-02-12 DIAGNOSIS — I252 Old myocardial infarction: Secondary | ICD-10-CM | POA: Insufficient documentation

## 2018-02-12 DIAGNOSIS — R1032 Left lower quadrant pain: Secondary | ICD-10-CM | POA: Diagnosis not present

## 2018-02-12 DIAGNOSIS — I2510 Atherosclerotic heart disease of native coronary artery without angina pectoris: Secondary | ICD-10-CM | POA: Insufficient documentation

## 2018-02-12 DIAGNOSIS — I129 Hypertensive chronic kidney disease with stage 1 through stage 4 chronic kidney disease, or unspecified chronic kidney disease: Secondary | ICD-10-CM | POA: Insufficient documentation

## 2018-02-12 DIAGNOSIS — R079 Chest pain, unspecified: Secondary | ICD-10-CM | POA: Diagnosis not present

## 2018-02-12 DIAGNOSIS — R911 Solitary pulmonary nodule: Secondary | ICD-10-CM

## 2018-02-12 DIAGNOSIS — R0789 Other chest pain: Secondary | ICD-10-CM | POA: Diagnosis present

## 2018-02-12 DIAGNOSIS — R109 Unspecified abdominal pain: Secondary | ICD-10-CM

## 2018-02-12 DIAGNOSIS — Z7982 Long term (current) use of aspirin: Secondary | ICD-10-CM | POA: Insufficient documentation

## 2018-02-12 DIAGNOSIS — Z8673 Personal history of transient ischemic attack (TIA), and cerebral infarction without residual deficits: Secondary | ICD-10-CM | POA: Insufficient documentation

## 2018-02-12 DIAGNOSIS — N183 Chronic kidney disease, stage 3 (moderate): Secondary | ICD-10-CM | POA: Diagnosis not present

## 2018-02-12 DIAGNOSIS — E1122 Type 2 diabetes mellitus with diabetic chronic kidney disease: Secondary | ICD-10-CM | POA: Diagnosis not present

## 2018-02-12 LAB — HEPATIC FUNCTION PANEL
ALT: 18 U/L (ref 14–54)
AST: 29 U/L (ref 15–41)
Albumin: 3.3 g/dL — ABNORMAL LOW (ref 3.5–5.0)
Alkaline Phosphatase: 61 U/L (ref 38–126)
BILIRUBIN DIRECT: 0.3 mg/dL (ref 0.1–0.5)
BILIRUBIN INDIRECT: 0.2 mg/dL — AB (ref 0.3–0.9)
BILIRUBIN TOTAL: 0.5 mg/dL (ref 0.3–1.2)
Total Protein: 6.2 g/dL — ABNORMAL LOW (ref 6.5–8.1)

## 2018-02-12 LAB — URINALYSIS, ROUTINE W REFLEX MICROSCOPIC
Bacteria, UA: NONE SEEN
Bilirubin Urine: NEGATIVE
GLUCOSE, UA: NEGATIVE mg/dL
Ketones, ur: NEGATIVE mg/dL
Leukocytes, UA: NEGATIVE
NITRITE: NEGATIVE
Protein, ur: NEGATIVE mg/dL
SPECIFIC GRAVITY, URINE: 1.006 (ref 1.005–1.030)
pH: 5 (ref 5.0–8.0)

## 2018-02-12 LAB — CBC
HCT: 38 % (ref 36.0–46.0)
Hemoglobin: 11.9 g/dL — ABNORMAL LOW (ref 12.0–15.0)
MCH: 28.4 pg (ref 26.0–34.0)
MCHC: 31.3 g/dL (ref 30.0–36.0)
MCV: 90.7 fL (ref 78.0–100.0)
PLATELETS: 142 10*3/uL — AB (ref 150–400)
RBC: 4.19 MIL/uL (ref 3.87–5.11)
RDW: 14.1 % (ref 11.5–15.5)
WBC: 6.2 10*3/uL (ref 4.0–10.5)

## 2018-02-12 LAB — I-STAT TROPONIN, ED: Troponin i, poc: 0.03 ng/mL (ref 0.00–0.08)

## 2018-02-12 LAB — BASIC METABOLIC PANEL
Anion gap: 7 (ref 5–15)
BUN: 22 mg/dL — ABNORMAL HIGH (ref 6–20)
CO2: 25 mmol/L (ref 22–32)
CREATININE: 1.76 mg/dL — AB (ref 0.44–1.00)
Calcium: 8.9 mg/dL (ref 8.9–10.3)
Chloride: 104 mmol/L (ref 101–111)
GFR calc Af Amer: 37 mL/min — ABNORMAL LOW (ref >60)
GFR, EST NON AFRICAN AMERICAN: 32 mL/min — AB (ref >60)
GLUCOSE: 91 mg/dL (ref 65–99)
Potassium: 4.9 mmol/L (ref 3.5–5.1)
Sodium: 136 mmol/L (ref 135–145)

## 2018-02-12 LAB — TROPONIN I

## 2018-02-12 LAB — LIPASE, BLOOD: LIPASE: 30 U/L (ref 11–51)

## 2018-02-12 MED ORDER — DICYCLOMINE HCL 20 MG PO TABS: 20.0000 mg | ORAL_TABLET | Freq: Four times a day (QID) | ORAL | 0 refills | Status: DC | PRN

## 2018-02-12 MED ORDER — MORPHINE SULFATE (PF) 4 MG/ML IV SOLN
4.0000 mg | INTRAVENOUS | Status: AC | PRN
  Administered 2018-02-12 (×2): 4 mg via INTRAVENOUS
  Filled 2018-02-12 (×2): qty 1

## 2018-02-12 MED ORDER — ONDANSETRON 4 MG PO TBDP: 4.0000 mg | ORAL_TABLET | Freq: Three times a day (TID) | ORAL | 0 refills | Status: DC | PRN

## 2018-02-12 MED ORDER — SODIUM CHLORIDE 0.9 % IV BOLUS
500.0000 mL | Freq: Once | INTRAVENOUS | Status: AC
Start: 2018-02-12 — End: 2018-02-12
  Administered 2018-02-12: 500 mL via INTRAVENOUS

## 2018-02-12 NOTE — ED Triage Notes (Signed)
Pt c/o LLQ pain x 2 weeks. States it feels bruised. C/o chest pressure since this am. Took 3 ntg with no relief. Pt staes it is bc of stress. C/o heartburn/nausea. Nad. Non diahporetic.

## 2018-02-12 NOTE — Discharge Instructions (Addendum)
Your CT scan showed an incidental finding:  "There is a new 6 mm left lower lobe pulmonary nodule. Non-contrast chest CT at 6 months is recommended. If the nodule is stable at time of repeat CT, then future CT at 18-24 months (from today's scan) is considered optional for low-risk patients, but is recommended for high-risk patients. This recommendation follows the consensus statement: Guidelines for Management of Incidental Pulmonary Nodules Detected on CT Images: From the Fleischner Society 2017; Radiology 2017; 284:228-243."  Take the prescriptions as directed.  Apply moist heat or ice to the area(s) of discomfort, for 15 minutes at a time, several times per day for the next few days.  Do not fall asleep on a heating or ice pack. Call your regular medical doctor on Monday to confirm your previously scheduled follow up appointment within the next 3 days.  Return to the Emergency Department immediately sooner if worsening.

## 2018-02-12 NOTE — ED Notes (Signed)
Patient transported to CT 

## 2018-02-12 NOTE — ED Provider Notes (Signed)
[facility] EMERGENCY DEPARTMENT Provider Note   CSN: [id number] Arrival date & time: 02/12/18  1402     History   Chief Complaint Chief Complaint  Patient presents with  . Chest Pain  . Abdominal Pain    HPI Summer Cook is a 52 y.o. female.  HPI  Pt was seen at 1505.  Per pt, c/o gradual onset and persistence of constant LLQ abd "pain" for the past 2 weeks.  Describes the abd pain as "feels like it's bruised." Pt also c/o gradual onset and persistence of constant mid-sternal chest "pain" that began approximately noon today while she was sitting in a chair. Pt describes the CP as "heartburn," "it's because of stress" and "they say it's chest wall pain."  Pt took her own SL ntg x3 without change in her symptoms. Denies N/V, no diarrhea, no fevers, no back pain, no rash, no palpitations, no cough/SOB, no black or blood in stools, no dysuria/hematuria, no vaginal bleeding/discharge, no injury.      Past Medical History:  Diagnosis Date  . Allergy   . Anxiety   . Asthma   . CAD (coronary artery disease), native coronary artery    a. cath 05/20/17 - 100% distal PDA --> medical mangement (no intervention done)  . Candida esophagitis ([place])   . Chest pain at rest 04/2017  . Depression   . Essential hypertension, benign   . GERD (gastroesophageal reflux disease)   . Gout   . Hemorrhoids   . 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  Last was Prednisone '40mg'$ ; 2 wk in 10/2012.  She also was on Prednisone bridged with Cellcept briefly but stopped due to lack of insurance.   . Myocardial infarction ([place])   . Pulmonary embolism ([place]) 04/2012  . Renal insufficiency   . Serrated adenoma of colon   . Steroid-induced diabetes ([place])   . Stroke ([place])   . Thrush, oral 05/29/2011    Patient Active Problem List   Diagnosis Date Noted  . Unstable angina ([place]) 05/29/2017  . CAD  (coronary artery disease): total distal RCA EF normal (05/20/17) 05/25/2017  . UTI (urinary tract infection) 05/25/2017  . Chest pain with moderate risk for cardiac etiology 05/23/2017  . Mixed hyperlipidemia   . NSTEMI (non-ST elevated myocardial infarction) ([place]) 05/20/2017  . Diabetes mellitus type 2, uncomplicated ([place]) 31/51/7616  . History of stroke   . Chronic back pain 12/23/2015  . Hypotension 02/16/2015  . Asthma, chronic 02/16/2015  . Anxiety state 02/16/2015  . CKD (chronic kidney disease) stage 3, GFR 30-59 ml/min (HCC) 04/28/2014  . Nausea with vomiting 01/25/2013  . Abnormal LFTs 01/25/2013  . Orthostatic hypotension 01/25/2013  . Rectal bleeding 12/26/2012  . History of pulmonary embolism 05/03/2012  . Hemorrhoids 06/04/2011  . 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 Immunosuppressed status ([place]) 06/04/2011  . Depression 06/04/2011  . Obesity 06/04/2011  . Idiopathic thrombocytopenic purpura ([place]) 03/18/2011    Past Surgical History:  Procedure Laterality Date  . BONE MARROW BIOPSY    . CARPAL TUNNEL RELEASE    . CARPAL TUNNEL RELEASE Left 05/20/2016   Procedure: CARPAL TUNNEL RELEASE;  Surgeon: Carole Civil, MD;  Location: [facility];  Service: Orthopedics;  Laterality: Left;  . CARPAL TUNNEL RELEASE Right 07/16/2016   Procedure: RIGHT CARPAL TUNNEL RELEASE;  Surgeon: Carole Civil, MD;  Location: [facility];  Service: Orthopedics;  Laterality: Right;  . CATARACT EXTRACTION    .  CHOLECYSTECTOMY  2008  . COLONOSCOPY WITH ESOPHAGOGASTRODUODENOSCOPY (EGD) N/A 01/04/2013   Dr. Gala Romney: esophageal plaques with +KOH, hh. Gastric antrum abnormal, bx reactive gastropathy. Anal canal hemorrhoids, colonic tics, normal TI, single polyp (sessile serrated tubular adenoma). Next TCS 12/2017  . LEFT HEART CATH AND CORONARY ANGIOGRAPHY N/A 05/20/2017   Procedure: LEFT HEART CATH AND CORONARY ANGIOGRAPHY;  Surgeon: Martinique, Peter M, MD;  Location: [place] [facility];  Service: Cardiovascular;   Laterality: N/A;  . 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 TEE WITHOUT CARDIOVERSION N/A 01/03/2016   Procedure: TRANSESOPHAGEAL ECHOCARDIOGRAM (TEE) WITH PROPOFOL;  Surgeon: Satira Sark, MD;  Location: [facility];  Service: Cardiovascular;  Laterality: N/A;     OB History    Gravida      Para      Term      Preterm      AB      Living  1     SAB      TAB      Ectopic      Multiple      Live Births               Home Medications    Prior to Admission medications   Medication Sig Start Date End Date Taking? Authorizing Provider  ALPRAZolam Duanne Moron) 0.5 MG tablet Take 1 tablet by mouth 3 (three) times daily.  11/02/17  Yes [provider]  aspirin EC 81 MG EC tablet Take 1 tablet (81 mg total) by mouth daily. 05/23/17  Yes Eileen Stanford, PA-C  diclofenac sodium (VOLTAREN) 1 % GEL Apply 2 g topically 4 (four) times daily. 06/06/17  Yes Debbe Odea, MD  escitalopram (LEXAPRO) 20 MG tablet Take 20 mg by mouth daily.  05/03/17  Yes [provider]  HYDROcodone-acetaminophen (NORCO) 10-325 MG tablet Take 1 tablet by mouth every 4 (four) hours as needed. 11/02/17  Yes [provider]  ipratropium-albuterol (DUONEB) 0.5-2.5 (3) MG/3ML SOLN Take 3 mLs by nebulization 3 (three) times daily as needed (shorntess of breath/wheezing). Patient taking differently: Take 3 mLs by nebulization 3 (three) times daily as needed (shortness of breath/wheezing).  02/18/15  Yes Kathie Dike, MD  isosorbide dinitrate (ISORDIL) 10 MG tablet Take 10 mg by mouth daily.    Yes [provider]  nitroGLYCERIN (NITROSTAT) 0.4 MG SL tablet Place 1 tablet (0.4 mg total) under the tongue every 5 (five) minutes x 3 doses as needed for chest pain. 05/25/17  Yes Carlota Raspberry, Tiffany, PA-C  PROAIR RESPICLICK 193 (90 BASE) MCG/ACT AEPB Inhale 2 puffs into the lungs every 4 (four) hours as needed (Shortness of breath).  12/27/14  Yes [provider]  prochlorperazine  (COMPAZINE) 10 MG tablet Take 10 mg by mouth as needed. For nausea   12/18/11  [provider]    Family History Family History  Problem Relation Age of Onset  . Cervical cancer Mother   . Lung cancer Father   . Colon polyps Father        ???  . Pneumonia Brother   . Colon cancer Paternal Grandmother 88  . AAA (abdominal aortic aneurysm) Paternal Grandmother   . Breast cancer Paternal Grandmother   . Colon cancer Paternal Grandfather 62  . Barrett's esophagus Paternal Aunt   . Stomach cancer Neg Hx   . Pancreatic cancer Neg Hx     Social History Social History   Tobacco Use  . Smoking status: Former Smoker    Years: 0.00  Types: Cigarettes    Last attempt to quit: 02/14/2009    Years since quitting: 9.0  . Smokeless tobacco: Never Used  Substance Use Topics  . Alcohol use: No    Alcohol/week: 0.0 oz  . Drug use: No     Allergies   Brintellix [vortioxetine]; Yellow jacket venom; Adhesive [tape]; and Zoloft [sertraline hcl]   Review of Systems Review of Systems ROS: Statement: All systems negative except as marked or noted in the HPI; Constitutional: Negative for fever and chills. ; ; Eyes: Negative for eye pain, redness and discharge. ; ; ENMT: Negative for ear pain, hoarseness, nasal congestion, sinus pressure and sore throat. ; ; Cardiovascular: +CP. Negative for palpitations, diaphoresis, dyspnea and peripheral edema. ; ; Respiratory: Negative for cough, wheezing and stridor. ; ; Gastrointestinal: +abd pain. Negative for nausea, vomiting, diarrhea, blood in stool, hematemesis, jaundice and rectal bleeding. . ; ; Genitourinary: Negative for dysuria, flank pain and hematuria. ; ; GYN:  No pelvic pain, no vaginal bleeding, no vaginal discharge, no vulvar pain. ;; Musculoskeletal: Negative for back pain and neck pain. Negative for swelling and trauma.; ; Skin: Negative for pruritus, rash, abrasions, blisters, bruising and skin lesion.; ; Neuro: Negative for headache,  lightheadedness and neck stiffness. Negative for weakness, altered level of consciousness, altered mental status, extremity weakness, paresthesias, involuntary movement, seizure and syncope.       Physical Exam Updated Vital Signs BP 108/87   Pulse 72   Temp 98.1 F (36.7 C) (Oral)   Resp 16   LMP 08/13/2011   SpO2 95%    Patient Vitals for the past 24 hrs:  BP Temp Temp src Pulse Resp SpO2  02/12/18 1945 - - - 70 13 93 %  02/12/18 1830 - - - 78 11 94 %  02/12/18 1815 - - - 72 16 100 %  02/12/18 1800 - - - 77 (!) 21 97 %  02/12/18 1745 - - - 68 14 97 %  02/12/18 1730 - - - 68 12 100 %  02/12/18 1715 - - - 70 12 98 %  02/12/18 1630 120/86 - - 71 15 91 %  02/12/18 1615 - - - 71 15 94 %  02/12/18 1600 (!) 126/97 - - 73 12 95 %  02/12/18 1530 (!) 117/97 - - - - -  02/12/18 1529 - - - 72 13 97 %  02/12/18 1515 - - - 72 14 94 %  02/12/18 1500 119/83 - - - - -  02/12/18 1448 - - - 72 16 95 %  02/12/18 1445 - - - 76 15 98 %  02/12/18 1430 108/87 - - 74 20 96 %  02/12/18 1413 115/86 98.1 F (36.7 C) Oral 87 17 95 %     Physical Exam 1510: Physical examination:  Nursing notes reviewed; Vital signs and O2 SAT reviewed;  Constitutional: Well developed, Well nourished, Well hydrated, In no acute distress; Head:  Normocephalic, atraumatic; Eyes: EOMI, PERRL, No scleral icterus; ENMT: Mouth and pharynx normal, Mucous membranes moist; Neck: Supple, Full range of motion, No lymphadenopathy; Cardiovascular: Regular rate and rhythm, No gallop; Respiratory: Breath sounds clear & equal bilaterally, No wheezes.  Speaking full sentences with ease, Normal respiratory effort/excursion; Chest: +anterior chest wall tender to palp. No soft tissue crepitus, no rash, no deformity. Movement normal; Abdomen: Soft, +LLQ tenderness to palp. No rebound or guarding. Nondistended, Normal bowel sounds; Genitourinary: No CVA tenderness; Extremities: Peripheral pulses normal, No tenderness, No edema, No calf edema  or asymmetry.; Neuro: AA&Ox3, Major CN grossly intact.  Speech clear. No gross focal motor or sensory deficits in extremities.; Skin: Color normal, Warm, Dry.   ED Treatments / Results  Labs (all labs ordered are listed, but only abnormal results are displayed)   EKG EKG Interpretation  Date/Time:  Saturday Feb 12 2018 15:21:39 EDT Ventricular Rate:  70 PR Interval:    QRS Duration: 134 QT Interval:  449 QTC Calculation: 485 R Axis:   67 Text Interpretation:  Sinus rhythm Nonspecific intraventricular conduction delay Anterior infarct, old When compared with ECG of 01/27/2018 T wave abnormality Anterior leads is no longer Present Confirmed by Francine Graven [phone]) on 02/12/2018 3:26:59 PM   Radiology   Procedures Procedures (including critical care time)  Medications Ordered in ED Medications  morphine 4 MG/ML injection 4 mg (4 mg Intravenous Given 02/12/18 1528)  sodium chloride 0.9 % bolus 500 mL (500 mLs Intravenous New Bag/Given 02/12/18 1528)     Initial Impression / Assessment and Plan / ED Course  I have reviewed the triage vital signs and the nursing notes.  Pertinent labs & imaging results that were available during my care of the patient were reviewed by me and considered in my medical decision making (see chart for details).  MDM Reviewed: previous chart, nursing note and vitals Reviewed previous: labs and ECG Interpretation: labs, ECG, x-ray and CT scan    . Results for orders placed or performed during the hospital encounter of 54/09/81  Basic metabolic panel  Result Value Ref Range   Sodium 136 135 - 145 mmol/L   Potassium 4.9 3.5 - 5.1 mmol/L   Chloride 104 101 - 111 mmol/L   CO2 25 22 - 32 mmol/L   Glucose, Bld 91 65 - 99 mg/dL   BUN 22 (H) 6 - 20 mg/dL   Creatinine, Ser 1.76 (H) 0.44 - 1.00 mg/dL   Calcium 8.9 8.9 - 10.3 mg/dL   GFR calc non Af Amer 32 (L) >60 mL/min   GFR calc Af Amer 37 (L) >60 mL/min   Anion gap 7 5 - 15  CBC  Result  Value Ref Range   WBC 6.2 4.0 - 10.5 K/uL   RBC 4.19 3.87 - 5.11 MIL/uL   Hemoglobin 11.9 (L) 12.0 - 15.0 g/dL   HCT 38.0 36.0 - 46.0 %   MCV 90.7 78.0 - 100.0 fL   MCH 28.4 26.0 - 34.0 pg   MCHC 31.3 30.0 - 36.0 g/dL   RDW 14.1 11.5 - 15.5 %   Platelets 142 (L) 150 - 400 K/uL  Hepatic function panel  Result Value Ref Range   Total Protein 6.2 (L) 6.5 - 8.1 g/dL   Albumin 3.3 (L) 3.5 - 5.0 g/dL   AST 29 15 - 41 U/L   ALT 18 14 - 54 U/L   Alkaline Phosphatase 61 38 - 126 U/L   Total Bilirubin 0.5 0.3 - 1.2 mg/dL   Bilirubin, Direct 0.3 0.1 - 0.5 mg/dL   Indirect Bilirubin 0.2 (L) 0.3 - 0.9 mg/dL  Lipase, blood  Result Value Ref Range   Lipase 30 11 - 51 U/L  Urinalysis, Routine w reflex microscopic  Result Value Ref Range   Color, Urine STRAW (A) YELLOW   APPearance CLEAR CLEAR   Specific Gravity, Urine 1.006 1.005 - 1.030   pH 5.0 5.0 - 8.0   Glucose, UA NEGATIVE NEGATIVE mg/dL   Hgb urine dipstick MODERATE (A) NEGATIVE   Bilirubin Urine NEGATIVE NEGATIVE  Ketones, ur NEGATIVE NEGATIVE mg/dL   Protein, ur NEGATIVE NEGATIVE mg/dL   Nitrite NEGATIVE NEGATIVE   Leukocytes, UA NEGATIVE NEGATIVE   RBC / HPF 0-5 0 - 5 RBC/hpf   WBC, UA 0-5 0 - 5 WBC/hpf   Bacteria, UA NONE SEEN NONE SEEN   Squamous Epithelial / LPF 0-5 0 - 5   Mucus PRESENT   Troponin I  Result Value Ref Range   Troponin I <0.03 <0.03 ng/mL  I-stat troponin, ED  Result Value Ref Range   Troponin i, poc 0.03 0.00 - 0.08 ng/mL   Comment 3           Ct Abdomen Pelvis Wo Contrast Result Date: 02/12/2018 CLINICAL DATA:  Patient with left lower quadrant pain for 2 weeks. EXAM: CT ABDOMEN AND PELVIS WITHOUT CONTRAST TECHNIQUE: Multidetector CT imaging of the abdomen and pelvis was performed following the standard protocol without IV contrast. COMPARISON:  CT abdomen pelvis 06/14/2017. FINDINGS: Lower chest: Normal heart size. New 6 mm left lower lobe nodule (image 14; series 4). No pleural effusion.  Hepatobiliary: The liver is nodular in contour. Left hepatic lobe hypertrophy. Status post cholecystectomy. No intrahepatic or extrahepatic biliary ductal dilatation. Pancreas: Unremarkable Spleen: Prior splenectomy. Adrenals/Urinary Tract: Adrenal glands are normal. Kidneys are symmetric in size. No hydronephrosis. Urinary bladder is unremarkable. Stomach/Bowel: No abnormal bowel wall thickening or evidence for bowel obstruction. No free fluid or free intraperitoneal air. Normal morphology of the stomach. Vascular/Lymphatic: Normal caliber abdominal aorta. Persistent dilatation of the left common iliac artery measuring 1.8 cm. No retroperitoneal or pelvic adenopathy. Reproductive: Uterus and adnexal structures unremarkable. Other: None. Musculoskeletal: No aggressive or acute appearing osseous lesions. Lumbar spine degenerative changes. IMPRESSION: 1. There is a new 6 mm left lower lobe pulmonary nodule. Non-contrast chest CT at 6 months is recommended. If the nodule is stable at time of repeat CT, then future CT at 18-24 months (from today's scan) is considered optional for low-risk patients, but is recommended for high-risk patients. This recommendation follows the consensus statement: Guidelines for Management of Incidental Pulmonary Nodules Detected on CT Images: From the Fleischner Society 2017; Radiology 2017; 284:228-243. 2. No acute process within the abdomen or pelvis. 3. Mild nodular contour of the liver with left hepatic lobe hypertrophy raising the possibility of cirrhosis. 4. Prior splenectomy. Electronically Signed   By: Lovey Newcomer M.D.   On: 02/12/2018 15:59   Dg Chest 2 View Result Date: 02/12/2018 CLINICAL DATA:  CHEST PAIN, PER ER NOTE, Pt c/o LLQ pain x 2 weeks. States it feels bruised. C/o chest pressure since this am. Took 3 ntg with no relief. Pt staes it is bc of stress. C/o heartburn/nausea. HISTORY OF DM, HTN EXAM: CHEST - 2 VIEW COMPARISON:  11/27/2017 FINDINGS: Lungs are clear. Heart  size and mediastinal contours are within normal limits. No effusion. Stable wedge deformity in the lower thoracic spine. Cholecystectomy clips. IMPRESSION: No acute cardiopulmonary disease. Electronically Signed   By: Lucrezia Europe M.D.   On: 02/12/2018 15:22    1700:   BUN/Cr per baseline. Pt workup reassuring. Will obtain 2nd troponin and EKG 8 hours after onset of symptoms (noon, per pt). Doubt PE with low risk [place]'. Pt and family updated regarding plan of care. States they "really don't want to stay" but will talk it over.  2025:  Pt took out her own IV, got herself dressed and is sitting in a chair in the room stating she was going to leave now.  2nd troponin pending. Pt refuses to stay.  I encouraged pt to stay, continues to refuse.  Pt makes her own medical decisions.  Risks of AMA explained to pt, including, but not limited to:  stroke, heart attack, cardiac arrythmia ("irregular heart rate/beat"), "passing out," temporary and/or permanent disability, death.  Pt verb understanding and continue to refuse to stay for 2nd troponin, understanding the consequences of her decision.  I encouraged pt to follow up with her PMD on Monday and return to the ED immediately if symptoms return, or for any other concerns.  Pt verb understanding, agreeable.  2100:  Pt decided to stay for results (negative). Doubt ACS as cause for symptoms with normal troponin x2 and unchanged EKG x2 after 8+ hours of constant symptoms. Pt states she has f/u PMD appt already scheduled for Tuesday. Tx symptomatically, f/u PMD as scheduled. Dx and testing d/w pt.  Questions answered.  Verb understanding, agreeable to d/c home with outpt f/u.     Final Clinical Impressions(s) / ED Diagnoses   Final diagnoses:  None    ED Discharge Orders    None       Francine Graven, DO 02/16/18 0725

## 2018-02-12 NOTE — ED Notes (Signed)
Pt returned from xray

## 2018-02-12 NOTE — ED Notes (Signed)
Patient transported to X-ray 

## 2018-02-12 NOTE — ED Notes (Signed)
Summer Cook, NT informed this nurse that pt had removed IV herself and was planning on leaving,  Pt then decided that she would stay for her results.

## 2018-02-14 LAB — URINE CULTURE: Culture: NO GROWTH

## 2018-02-15 DIAGNOSIS — D693 Immune thrombocytopenic purpura: Secondary | ICD-10-CM | POA: Diagnosis not present

## 2018-02-15 DIAGNOSIS — Z6831 Body mass index (BMI) 31.0-31.9, adult: Secondary | ICD-10-CM | POA: Diagnosis not present

## 2018-02-15 DIAGNOSIS — R69 Illness, unspecified: Secondary | ICD-10-CM | POA: Diagnosis not present

## 2018-02-15 DIAGNOSIS — E6609 Other obesity due to excess calories: Secondary | ICD-10-CM | POA: Diagnosis not present

## 2018-02-15 DIAGNOSIS — I200 Unstable angina: Secondary | ICD-10-CM | POA: Diagnosis not present

## 2018-02-15 DIAGNOSIS — Z0001 Encounter for general adult medical examination with abnormal findings: Secondary | ICD-10-CM | POA: Diagnosis not present

## 2018-02-15 DIAGNOSIS — N183 Chronic kidney disease, stage 3 (moderate): Secondary | ICD-10-CM | POA: Diagnosis not present

## 2018-02-16 ENCOUNTER — Other Ambulatory Visit (HOSPITAL_COMMUNITY): Payer: Self-pay | Admitting: *Deleted

## 2018-02-16 DIAGNOSIS — Z862 Personal history of diseases of the blood and blood-forming organs and certain disorders involving the immune mechanism: Secondary | ICD-10-CM

## 2018-02-17 ENCOUNTER — Inpatient Hospital Stay (HOSPITAL_COMMUNITY): Payer: Medicare HMO | Attending: Hematology

## 2018-02-17 DIAGNOSIS — Z800 Family history of malignant neoplasm of digestive organs: Secondary | ICD-10-CM | POA: Diagnosis not present

## 2018-02-17 DIAGNOSIS — Z86711 Personal history of pulmonary embolism: Secondary | ICD-10-CM | POA: Insufficient documentation

## 2018-02-17 DIAGNOSIS — Z87891 Personal history of nicotine dependence: Secondary | ICD-10-CM | POA: Insufficient documentation

## 2018-02-17 DIAGNOSIS — Z7982 Long term (current) use of aspirin: Secondary | ICD-10-CM | POA: Insufficient documentation

## 2018-02-17 DIAGNOSIS — Z79899 Other long term (current) drug therapy: Secondary | ICD-10-CM | POA: Insufficient documentation

## 2018-02-17 DIAGNOSIS — Z862 Personal history of diseases of the blood and blood-forming organs and certain disorders involving the immune mechanism: Secondary | ICD-10-CM | POA: Diagnosis present

## 2018-02-17 LAB — COMPREHENSIVE METABOLIC PANEL
ALBUMIN: 3.9 g/dL (ref 3.5–5.0)
ALT: 19 U/L (ref 14–54)
AST: 29 U/L (ref 15–41)
Alkaline Phosphatase: 68 U/L (ref 38–126)
Anion gap: 8 (ref 5–15)
BUN: 17 mg/dL (ref 6–20)
CHLORIDE: 106 mmol/L (ref 101–111)
CO2: 25 mmol/L (ref 22–32)
Calcium: 9.3 mg/dL (ref 8.9–10.3)
Creatinine, Ser: 1.71 mg/dL — ABNORMAL HIGH (ref 0.44–1.00)
GFR calc Af Amer: 39 mL/min — ABNORMAL LOW (ref >60)
GFR, EST NON AFRICAN AMERICAN: 33 mL/min — AB (ref >60)
GLUCOSE: 100 mg/dL — AB (ref 65–99)
POTASSIUM: 4.7 mmol/L (ref 3.5–5.1)
Sodium: 139 mmol/L (ref 135–145)
Total Bilirubin: 1 mg/dL (ref 0.3–1.2)
Total Protein: 7.2 g/dL (ref 6.5–8.1)

## 2018-02-17 LAB — CBC WITH DIFFERENTIAL/PLATELET
BASOS ABS: 0.1 10*3/uL (ref 0.0–0.1)
BASOS PCT: 1 %
EOS PCT: 2 %
Eosinophils Absolute: 0.1 10*3/uL (ref 0.0–0.7)
HCT: 40.4 % (ref 36.0–46.0)
Hemoglobin: 12.9 g/dL (ref 12.0–15.0)
Lymphocytes Relative: 31 %
Lymphs Abs: 1.8 10*3/uL (ref 0.7–4.0)
MCH: 28.9 pg (ref 26.0–34.0)
MCHC: 31.9 g/dL (ref 30.0–36.0)
MCV: 90.6 fL (ref 78.0–100.0)
MONO ABS: 0.5 10*3/uL (ref 0.1–1.0)
Monocytes Relative: 9 %
Neutro Abs: 3.4 10*3/uL (ref 1.7–7.7)
Neutrophils Relative %: 57 %
PLATELETS: 184 10*3/uL (ref 150–400)
RBC: 4.46 MIL/uL (ref 3.87–5.11)
RDW: 14 % (ref 11.5–15.5)
WBC: 5.9 10*3/uL (ref 4.0–10.5)

## 2018-02-18 LAB — FERRITIN: Ferritin: 56 ng/mL (ref 11–307)

## 2018-02-18 LAB — IRON AND TIBC
Iron: 78 ug/dL (ref 28–170)
Saturation Ratios: 21 % (ref 10.4–31.8)
TIBC: 370 ug/dL (ref 250–450)
UIBC: 292 ug/dL

## 2018-02-24 ENCOUNTER — Encounter (HOSPITAL_COMMUNITY): Payer: Self-pay | Admitting: Internal Medicine

## 2018-02-24 ENCOUNTER — Inpatient Hospital Stay (HOSPITAL_BASED_OUTPATIENT_CLINIC_OR_DEPARTMENT_OTHER): Payer: Medicare HMO | Admitting: Internal Medicine

## 2018-02-24 VITALS — BP 118/94 | HR 91 | Temp 98.3°F | Resp 18 | Wt 180.8 lb

## 2018-02-24 DIAGNOSIS — Z862 Personal history of diseases of the blood and blood-forming organs and certain disorders involving the immune mechanism: Secondary | ICD-10-CM | POA: Diagnosis not present

## 2018-02-24 DIAGNOSIS — D693 Immune thrombocytopenic purpura: Secondary | ICD-10-CM

## 2018-02-24 DIAGNOSIS — R69 Illness, unspecified: Secondary | ICD-10-CM | POA: Diagnosis not present

## 2018-02-24 DIAGNOSIS — Z7982 Long term (current) use of aspirin: Secondary | ICD-10-CM

## 2018-02-24 DIAGNOSIS — Z800 Family history of malignant neoplasm of digestive organs: Secondary | ICD-10-CM

## 2018-02-24 DIAGNOSIS — Z86711 Personal history of pulmonary embolism: Secondary | ICD-10-CM | POA: Diagnosis not present

## 2018-02-24 DIAGNOSIS — Z87891 Personal history of nicotine dependence: Secondary | ICD-10-CM

## 2018-02-24 DIAGNOSIS — Z79899 Other long term (current) drug therapy: Secondary | ICD-10-CM | POA: Diagnosis not present

## 2018-02-24 NOTE — Progress Notes (Signed)
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  Assessment and Plan:  ITP.  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 She was previously followed by Dr. Talbert Cage.  She has been off all therapy for some time and remains on observation.    Labs done 02/17/2018 and reviewed with pt show WBC 5.9, hb 12.9, plts 184,000.  Plts have remained stable and > 100,000.  She will RTC in 6 months for repeat labs and will be seen for follow-up in 1 year.    2.  HTN.  BP is 118/94.  Follow-up with PCP.    3.  CKD. Cr is 1.71.  Follow-up with PCP or nephrology as directed.    4.  Health maintenance.  Continue GI and mammograms as recommended by PCP.    5.  Questions regarding coming off of disability.  Have asked Case manager to speak with pt.    Current Status:  Pt is seen today for follow-up to go over labs.  She has questions about coming off disability.    Problem List Patient Active Problem List   Diagnosis Date Noted  . Unstable angina ([place]) [I20.0] 05/29/2017  . CAD (coronary artery disease): total distal RCA EF normal (05/20/17) [I25.10] 05/25/2017  . UTI (urinary tract infection) [N39.0] 05/25/2017  . Chest pain with moderate risk for cardiac etiology [R07.9] 05/23/2017  . Mixed hyperlipidemia [E78.2]   . NSTEMI (non-ST elevated myocardial infarction) ([place]) [I21.4] 05/20/2017  . Diabetes mellitus type 2, uncomplicated ([place]) [Q46.9] 02/25/2017  . History of stroke [Z86.73]   . Chronic back pain [M54.9, G89.29] 12/23/2015  . Hypotension [I95.9] 02/16/2015  . Asthma, chronic [J45.909] 02/16/2015  . Anxiety state [F41.1] 02/16/2015  . CKD (chronic kidney disease) stage 3, GFR 30-59 ml/min (HCC) [N18.3]  04/28/2014  . Nausea with vomiting [R11.2] 01/25/2013  . Abnormal LFTs [R94.5] 01/25/2013  . Orthostatic hypotension [I95.1] 01/25/2013  . Rectal bleeding [K62.5] 12/26/2012  . History of pulmonary embolism [Z86.711] 05/03/2012  . Hemorrhoids [455] 06/04/2011  . 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 Immunosuppressed status ([place]) [D89.9] 06/04/2011  . Depression [F32.9] 06/04/2011  . Obesity [E66.9] 06/04/2011  . 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 Allergy   . Anxiety   . Asthma   . CAD (coronary artery disease), native coronary artery    a. cath 05/20/17 - 100% distal PDA --> medical mangement (no intervention done)  . Candida esophagitis ([place])   . Chest pain at rest 04/2017  . Depression   . Essential hypertension, benign   . GERD (gastroesophageal reflux disease)   . Gout   . Hemorrhoids   . 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  Last was Prednisone '40mg'$ ; 2 wk in 10/2012.  She also was on Prednisone bridged with Cellcept briefly but stopped due to lack of insurance.   . Myocardial infarction ([place])   . Pulmonary embolism ([place]) 04/2012  . Renal insufficiency   . Serrated adenoma of colon   . Steroid-induced diabetes ([place])   . Stroke ([place])   . Thrush, oral 05/29/2011    Past Surgical History Past Surgical History:  Procedure Laterality Date  . BONE MARROW BIOPSY    .  CARPAL TUNNEL RELEASE    . CARPAL TUNNEL RELEASE Left 05/20/2016   Procedure: CARPAL TUNNEL RELEASE;  Surgeon: Carole Civil, MD;  Location: [facility];  Service: Orthopedics;  Laterality: Left;  . CARPAL TUNNEL RELEASE Right 07/16/2016   Procedure: RIGHT CARPAL TUNNEL RELEASE;  Surgeon: Carole Civil, MD;  Location: [facility];  Service: Orthopedics;  Laterality: Right;  . CATARACT EXTRACTION    .  CHOLECYSTECTOMY  2008  . COLONOSCOPY WITH ESOPHAGOGASTRODUODENOSCOPY (EGD) N/A 01/04/2013   Dr. Gala Romney: esophageal plaques with +KOH, hh. Gastric antrum abnormal, bx reactive gastropathy. Anal canal hemorrhoids, colonic tics, normal TI, single polyp (sessile serrated tubular adenoma). Next TCS 12/2017  . LEFT HEART CATH AND CORONARY ANGIOGRAPHY N/A 05/20/2017   Procedure: LEFT HEART CATH AND CORONARY ANGIOGRAPHY;  Surgeon: Martinique, Peter M, MD;  Location: [facility];  Service: Cardiovascular;  Laterality: N/A;  . 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 TEE WITHOUT CARDIOVERSION N/A 01/03/2016   Procedure: TRANSESOPHAGEAL ECHOCARDIOGRAM (TEE) WITH PROPOFOL;  Surgeon: Satira Sark, MD;  Location: [facility];  Service: Cardiovascular;  Laterality: N/A;    Family History Family History  Problem Relation Age of Onset  . Cervical cancer Mother   . Lung cancer Father   . Colon polyps Father        ???  . Pneumonia Brother   . Colon cancer Paternal Grandmother 88  . AAA (abdominal aortic aneurysm) Paternal Grandmother   . Breast cancer Paternal Grandmother   . Colon cancer Paternal Grandfather 57  . Barrett's esophagus Paternal Aunt   . Stomach cancer Neg Hx   . Pancreatic cancer Neg Hx      Social History  reports that she quit smoking about 9 years ago. Her smoking use included cigarettes. She quit after 0.00 years of use. She has never used smokeless tobacco. She reports that she does not drink alcohol or use drugs.  Medications  Current Outpatient Medications:  .  ALPRAZolam (XANAX) 0.5 MG tablet, Take 1 tablet by mouth 3 (three) times daily. , Disp: , Rfl: 2 .  aspirin EC 81 MG EC tablet, Take 1 tablet (81 mg total) by mouth daily., Disp: , Rfl:  .  diclofenac sodium (VOLTAREN) 1 % GEL, Apply 2 g topically 4 (four) times daily., Disp: , Rfl:  .  dicyclomine (BENTYL) 20 MG tablet, Take 1 tablet (20 mg total) by mouth every 6 (six) hours as needed for spasms (abdominal cramping).  (Patient not taking: Reported on 02/24/2018), Disp: 15 tablet, Rfl: 0 .  DULoxetine (CYMBALTA) 60 MG capsule, Take 60 mg by mouth daily., Disp: , Rfl: 11 .  escitalopram (LEXAPRO) 20 MG tablet, Take 20 mg by mouth daily. , Disp: , Rfl:  .  HYDROcodone-acetaminophen (NORCO) 10-325 MG tablet, Take 1 tablet by mouth every 4 (four) hours as needed., Disp: , Rfl: 0 .  ipratropium-albuterol (DUONEB) 0.5-2.5 (3) MG/3ML SOLN, Take 3 mLs by nebulization 3 (three) times daily as needed (shorntess of breath/wheezing). (Patient taking differently: Take 3 mLs by nebulization 3 (three) times daily as needed (shortness of breath/wheezing). ), Disp: 360 mL, Rfl: 0 .  isosorbide dinitrate (ISORDIL) 10 MG tablet, Take 10 mg by mouth daily. , Disp: , Rfl:  .  nitroGLYCERIN (NITROSTAT) 0.4 MG SL tablet, Place 1 tablet (0.4 mg total) under the tongue every 5 (five) minutes x 3 doses as needed for chest pain., Disp: 25 tablet, Rfl: 12 .  ondansetron (ZOFRAN ODT) 4 MG disintegrating tablet, Take  1 tablet (4 mg total) by mouth every 8 (eight) hours as needed for nausea or vomiting. (Patient not taking: Reported on 02/24/2018), Disp: 6 tablet, Rfl: 0 .  PROAIR RESPICLICK 063 (90 BASE) MCG/ACT AEPB, Inhale 2 puffs into the lungs every 4 (four) hours as needed (Shortness of breath). , Disp: , Rfl: 3  Allergies Brintellix [vortioxetine]; Yellow jacket venom; Adhesive [tape]; and Zoloft [sertraline hcl]  Review of Systems Review of Systems - Oncology ROS as per HPI otherwise 12 point ROS is negative.   Physical Exam  Vitals Wt Readings from Last 3 Encounters:  02/24/18 180 lb 12.8 oz (82 kg)  12/11/17 185 lb (83.9 kg)  11/27/17 187 lb (84.8 kg)   Temp Readings from Last 3 Encounters:  02/24/18 98.3 F (36.8 C) (Oral)  02/12/18 98.1 F (36.7 C) (Oral)  12/11/17 99 F (37.2 C) (Oral)   BP Readings from Last 3 Encounters:  02/24/18 (!) 118/94  02/12/18 (!) 130/97  12/11/17 103/89   Pulse Readings from Last 3  Encounters:  02/24/18 91  02/12/18 83  12/11/17 100   Constitutional: Well-developed, well-nourished, and in no distress.   HENT: Head: Normocephalic and atraumatic.  Mouth/Throat: No oropharyngeal exudate. Mucosa moist. Eyes: Pupils are equal, round, and reactive to light. Conjunctivae are normal. No scleral icterus.  Neck: Normal range of motion. Neck supple. No JVD present.  Cardiovascular: Normal rate, regular rhythm and normal heart sounds.  Exam reveals no gallop and no friction rub.   No murmur heard. Pulmonary/Chest: Effort normal and breath sounds normal. No respiratory distress. No wheezes.No rales.  Abdominal: Soft. Bowel sounds are normal. No distension. There is no tenderness. There is no guarding.  Musculoskeletal: No edema or tenderness.  Lymphadenopathy: No cervical, axillary or supraclavicular adenopathy.  Neurological: Alert and oriented to person, place, and time. No cranial nerve deficit.  Skin: Skin is warm and dry. No rash noted. No erythema. No pallor.  Psychiatric: Affect and judgment normal.   Labs No visits with results within 3 Day(s) from this visit.  Latest known visit with results is:  Appointment on 02/17/2018  Component Date Value Ref Range Status  . WBC 02/17/2018 5.9  4.0 - 10.5 K/uL Final  . RBC 02/17/2018 4.46  3.87 - 5.11 MIL/uL Final  . Hemoglobin 02/17/2018 12.9  12.0 - 15.0 g/dL Final  . HCT 02/17/2018 40.4  36.0 - 46.0 % Final  . MCV 02/17/2018 90.6  78.0 - 100.0 fL Final  . MCH 02/17/2018 28.9  26.0 - 34.0 pg Final  . MCHC 02/17/2018 31.9  30.0 - 36.0 g/dL Final  . RDW 02/17/2018 14.0  11.5 - 15.5 % Final  . Platelets 02/17/2018 184  150 - 400 K/uL Final  . Neutrophils Relative % 02/17/2018 57  % Final  . Neutro Abs 02/17/2018 3.4  1.7 - 7.7 K/uL Final  . Lymphocytes Relative 02/17/2018 31  % Final  . Lymphs Abs 02/17/2018 1.8  0.7 - 4.0 K/uL Final  . Monocytes Relative 02/17/2018 9  % Final  . Monocytes Absolute 02/17/2018 0.5  0.1 -  1.0 K/uL Final  . Eosinophils Relative 02/17/2018 2  % Final  . Eosinophils Absolute 02/17/2018 0.1  0.0 - 0.7 K/uL Final  . Basophils Relative 02/17/2018 1  % Final  . Basophils Absolute 02/17/2018 0.1  0.0 - 0.1 K/uL Final   Performed at [facility], [address], [place], [place] [address]  . Sodium 02/17/2018 139  135 - 145 mmol/L Final  .  Potassium 02/17/2018 4.7  3.5 - 5.1 mmol/L Final  . Chloride 02/17/2018 106  101 - 111 mmol/L Final  . CO2 02/17/2018 25  22 - 32 mmol/L Final  . Glucose, Bld 02/17/2018 100* 65 - 99 mg/dL Final  . BUN 02/17/2018 17  6 - 20 mg/dL Final  . Creatinine, Ser 02/17/2018 1.71* 0.44 - 1.00 mg/dL Final  . Calcium 02/17/2018 9.3  8.9 - 10.3 mg/dL Final  . Total Protein 02/17/2018 7.2  6.5 - 8.1 g/dL Final  . Albumin 02/17/2018 3.9  3.5 - 5.0 g/dL Final  . AST 02/17/2018 29  15 - 41 U/L Final  . ALT 02/17/2018 19  14 - 54 U/L Final  . Alkaline Phosphatase 02/17/2018 68  38 - 126 U/L Final  . Total Bilirubin 02/17/2018 1.0  0.3 - 1.2 mg/dL Final  . GFR calc non Af Amer 02/17/2018 33* >60 mL/min Final  . GFR calc Af Amer 02/17/2018 39* >60 mL/min Final   Comment: (NOTE) The eGFR has been calculated using the CKD EPI equation. This calculation has not been validated in all clinical situations. eGFR's persistently <60 mL/min signify possible Chronic Kidney Disease.   Georgiann Hahn gap 02/17/2018 8  5 - 15 Final   Performed at [facility], [address] [place], [place] [address]  . Iron 02/17/2018 78  28 - 170 ug/dL Final  . TIBC 02/17/2018 370  250 - 450 ug/dL Final  . Saturation Ratios 02/17/2018 21  10.4 - 31.8 % Final  . UIBC 02/17/2018 292  ug/dL Final   Performed at [facility], [place] [address] [place], [place] [address]  . Ferritin 02/17/2018 56  11 - 307 ng/mL Final   Performed at [facility] [address], [place], [place] [address]     Pathology Orders Placed This Encounter  Procedures  . CBC with Differential/Platelet     Standing Status:   Future    Standing Expiration Date:   02/25/2019  . Comprehensive metabolic panel    Standing Status:   Future    Standing Expiration Date:   02/25/2019  . Lactate dehydrogenase    Standing Status:   Future    Standing Expiration Date:   02/25/2019  . CBC with Differential/Platelet    Standing Status:   Future    Standing Expiration Date:   02/25/2020  . Comprehensive metabolic panel    Standing Status:   Future    Standing Expiration Date:   02/25/2020  . Lactate dehydrogenase    Standing Status:   Future    Standing Expiration Date:   02/25/2020       Zoila Shutter MD

## 2018-02-24 NOTE — Patient Instructions (Signed)
[facility] Discharge Instructions  You were seen by Dr. Higgs today. Follow-up as scheduled   Thank you for choosing [facility] at [facility] to provide your oncology and hematology care.  To afford each patient quality time with our provider, please arrive at least 15 minutes before your scheduled appointment time.   If you have a lab appointment with the Cancer Center please come in thru the  Main Entrance and check in at the main information desk  You need to re-schedule your appointment should you arrive 10 or more minutes late.  We strive to give you quality time with our providers, and arriving late affects you and other patients whose appointments are after yours.  Also, if you no show three or more times for appointments you may be dismissed from the clinic at the providers discretion.     Again, thank you for choosing [facility].  Our hope is that these requests will decrease the amount of time that you wait before being seen by our physicians.       _____________________________________________________________  Should you have questions after your visit to [facility], please contact our office at [phone] between the hours of 8:30 a.m. and 4:30 p.m.  Voicemails left after 4:30 p.m. will not be returned until the following business day.  For prescription refill requests, have your pharmacy contact our office.       Resources For Cancer Patients and their Caregivers ? American Cancer Society: Can assist with transportation, wigs, general needs, runs Look Good Feel Better.        1-888-227-6333 ? Cancer Care: Provides financial assistance, online support groups, medication/co-pay assistance.  1-800-813-HOPE (4673) ? [facility] Assists [place] cancer patients and their families through emotional , educational and financial support.  336-427-4357 ? [place] DSS Where to apply for food stamps, Medicaid and utility assistance. 336-342-1394 ? [facility]: Transportation to medical appointments. 336-347-2287 ? Social Security Administration: May apply for disability if have a Stage IV cancer. 336-342-7796 1-800-772-1213 ? [place] Aging, Disability and Transit Services: Assists with nutrition, care and transit needs. 336-349-2343  Cancer Center Support Programs:   > Cancer Support Group  2nd Tuesday of the month 1pm-2pm, Journey Room   > Creative Journey  3rd Tuesday of the month 1130am-1pm, Journey Room    

## 2018-03-01 ENCOUNTER — Encounter: Payer: Self-pay | Admitting: Internal Medicine

## 2018-03-03 IMAGING — DX DG CHEST 2V
2 series · 2 of 2 positions shown · non-contrast
Comparison: None 4844

CLINICAL DATA: Heart palpitations, nausea and cough. Hx of stroke
in [REDACTED]

EXAM:
CHEST  2 VIEW

[chest pa]
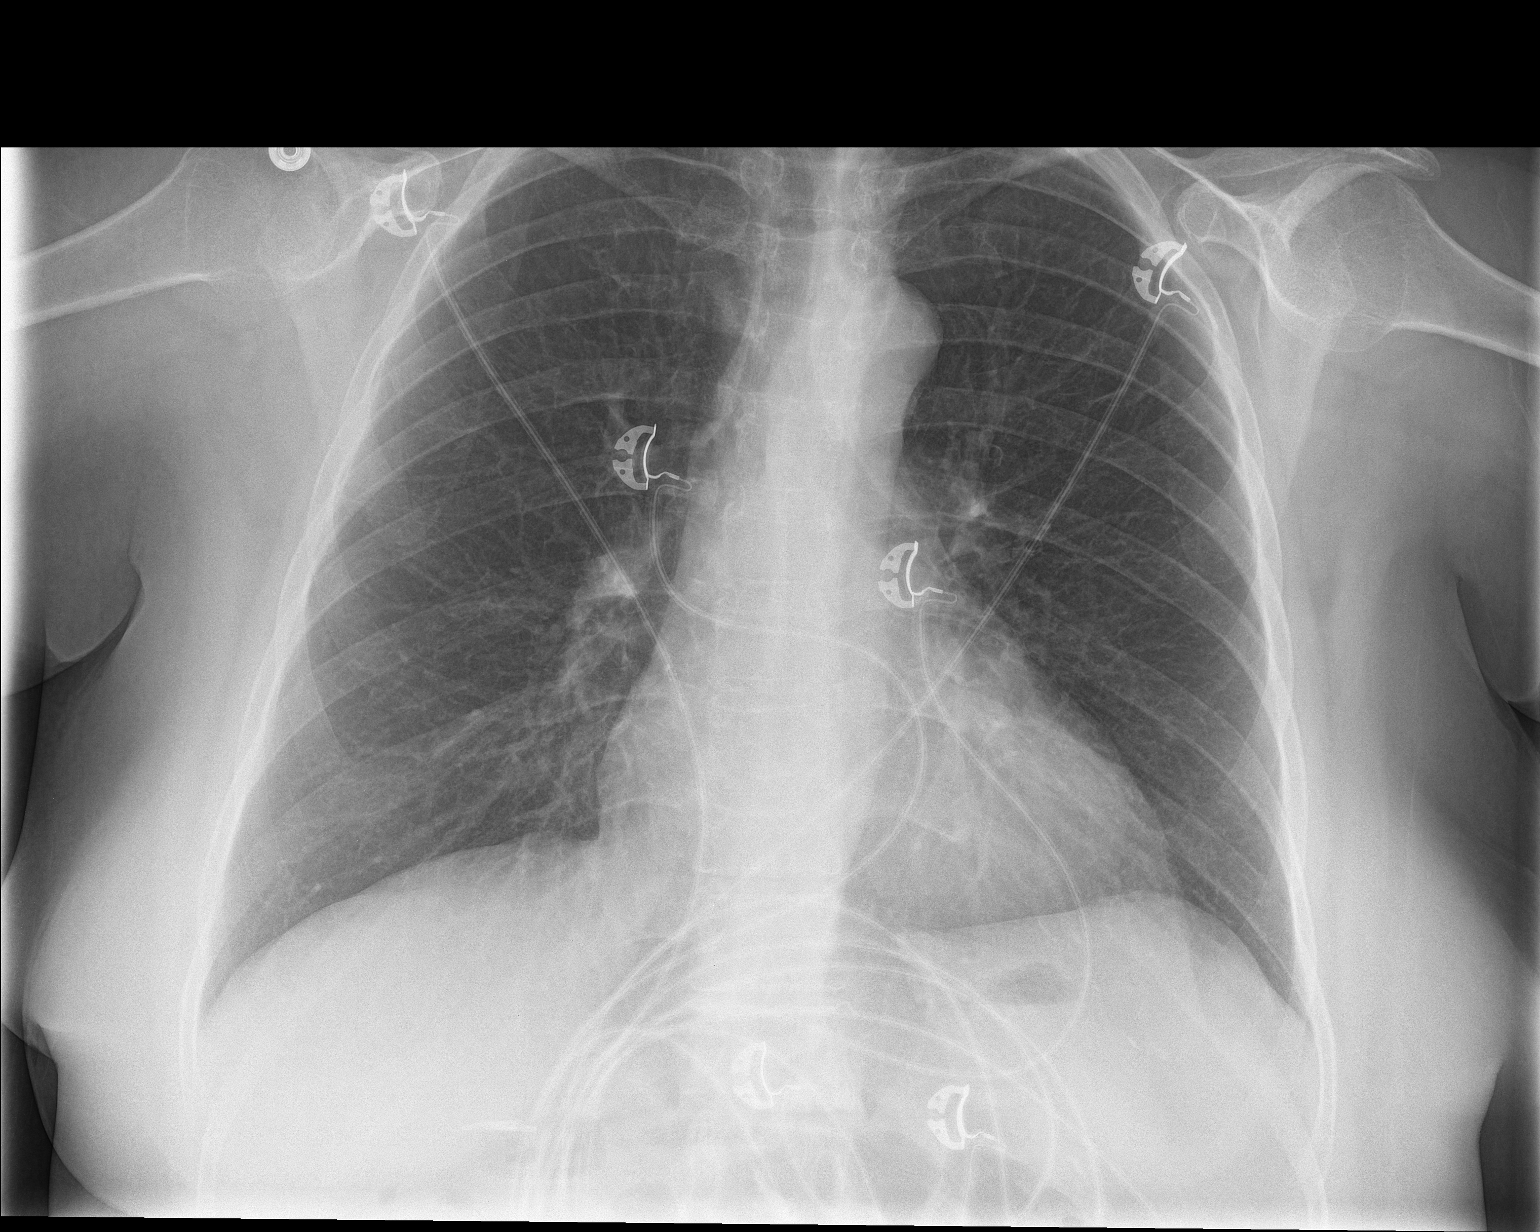

[chest lat]
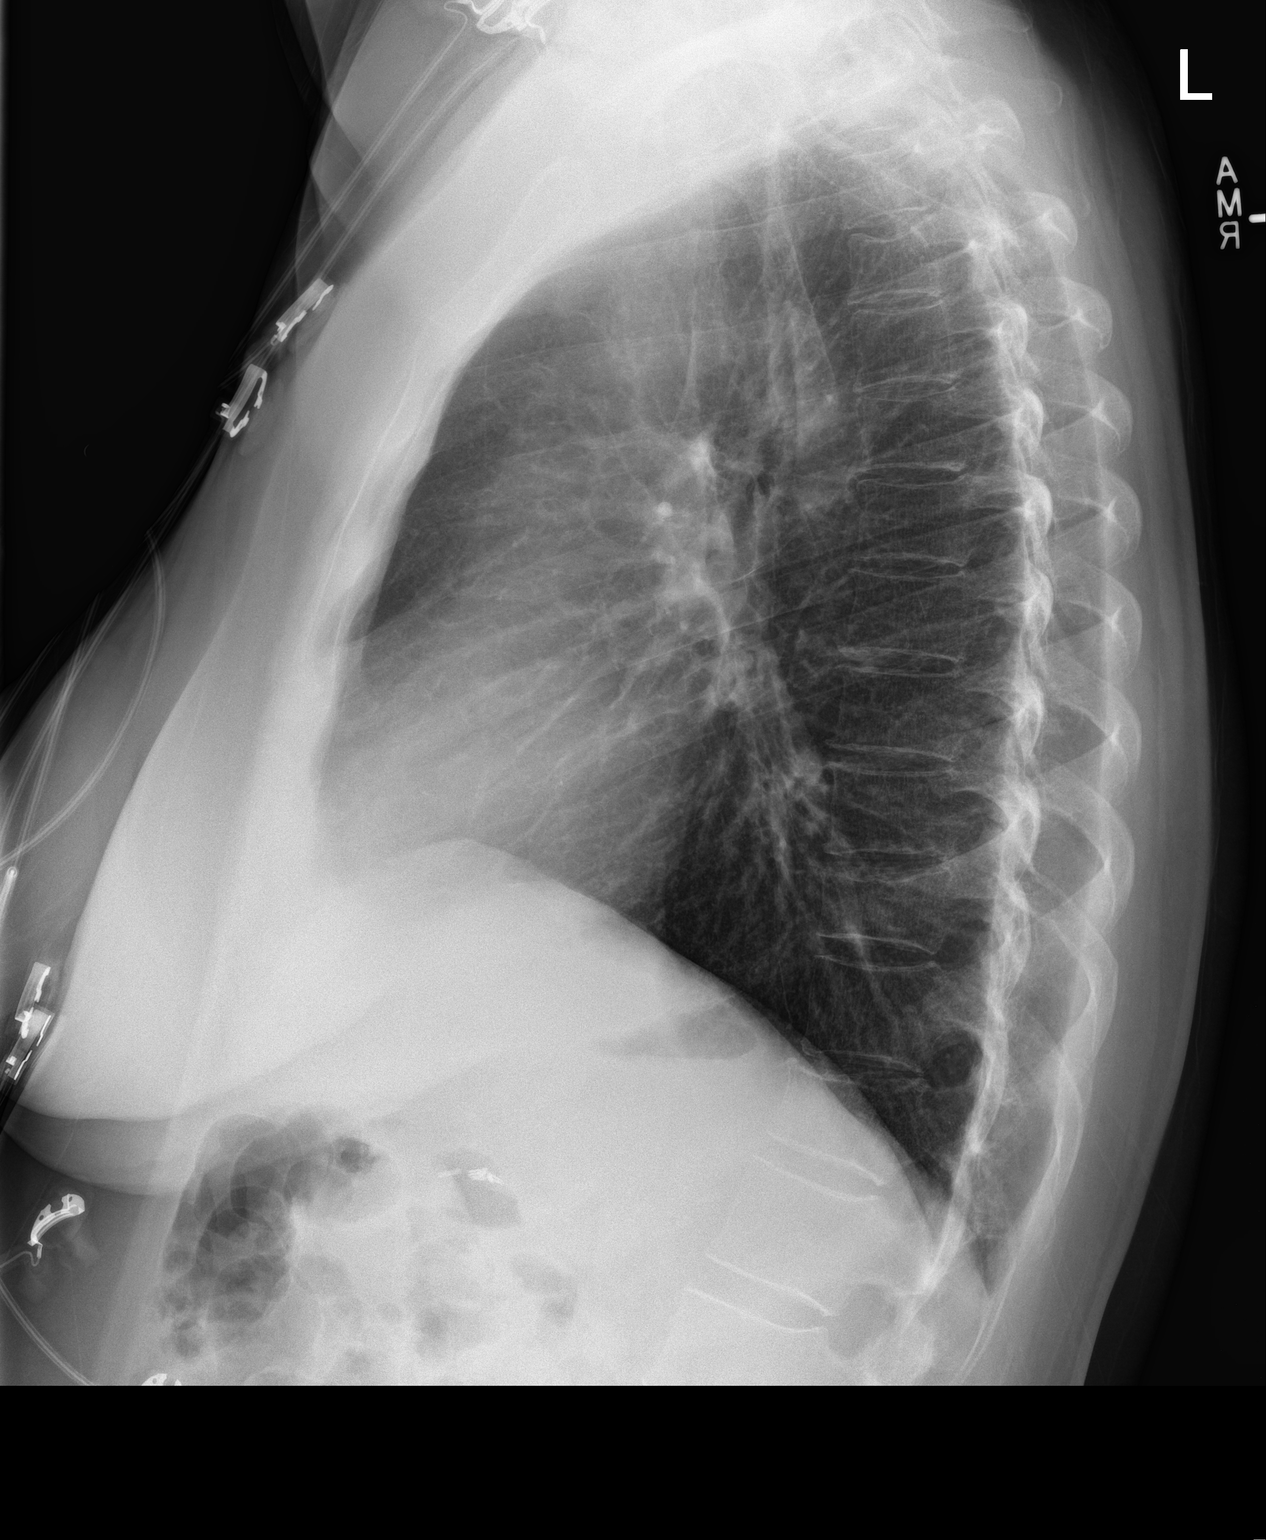

[2 of 2 positions shown; findings below may reference images not displayed]

FINDINGS: Heart is upper limits normal. There are no focal consolidations or
pleural effusions. No pulmonary edema. There are chronic wedge
compression fractures of T12 and T10. Surgical clips are noted in
the upper abdomen.
IMPRESSION: No evidence for acute cardiopulmonary abnormality.

## 2018-03-03 IMAGING — CT CT HEAD W/O CM
3 series · 15 of 47 positions shown, 18 images · non-contrast
Comparison: Head CT 06/17/2016, brain MRI 12/16/2015

CLINICAL DATA: Dizziness, flu like symptoms x 1 week. Headache,
elevated BP. Pt had stroke [DATE].

EXAM:
CT HEAD WITHOUT CONTRAST
TECHNIQUE: Contiguous axial images were obtained from the base of the skull
through the vertex without intravenous contrast.

[Series 2: head wo · axial · 0.41mm/px · z∈[+1560,+1685]mm · 9 of 31 slices shown, 12 images]
[im 3/31  brain]
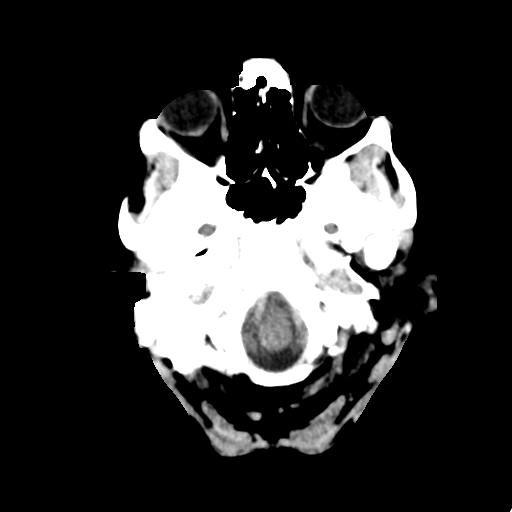
[im 3/31  bone]
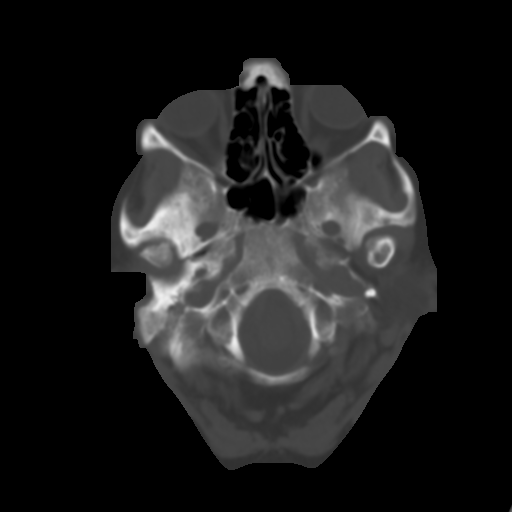
[im 6/31  brain]
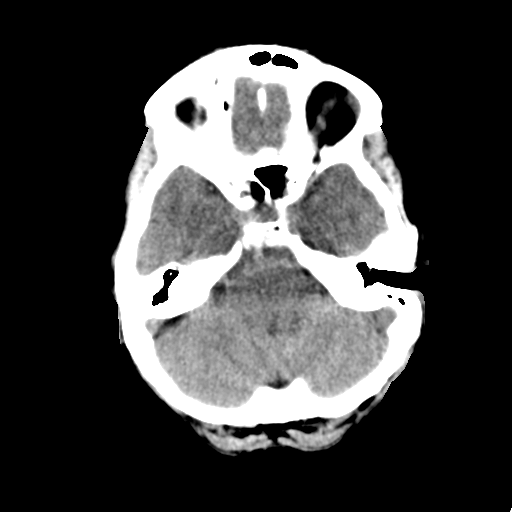
[im 9/31  brain]
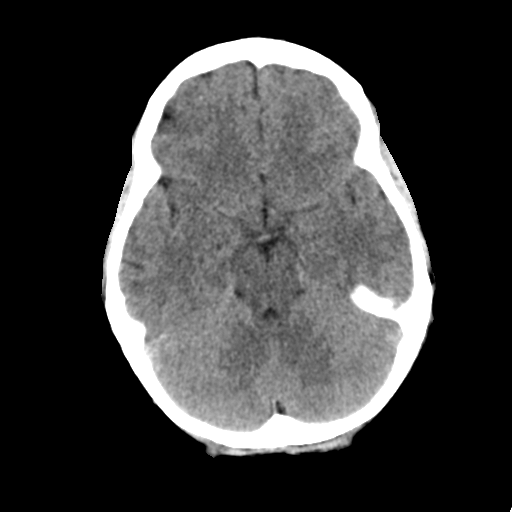
[im 12/31  brain]
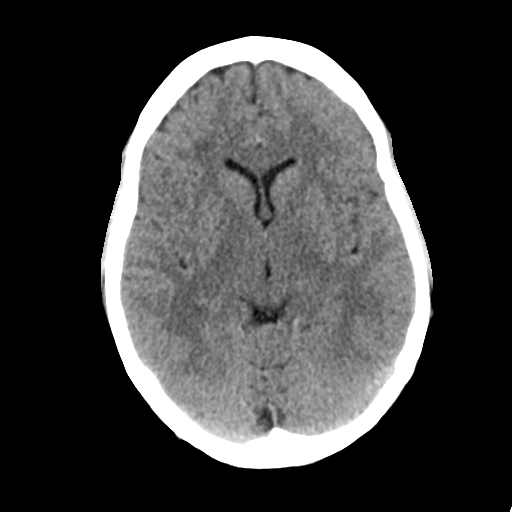
[im 16/31  brain]
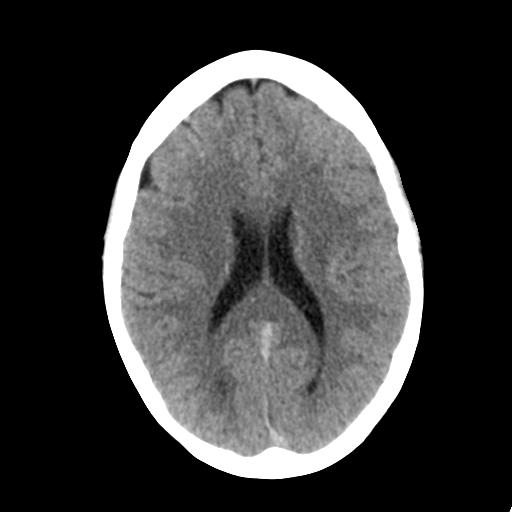
[im 16/31  bone]
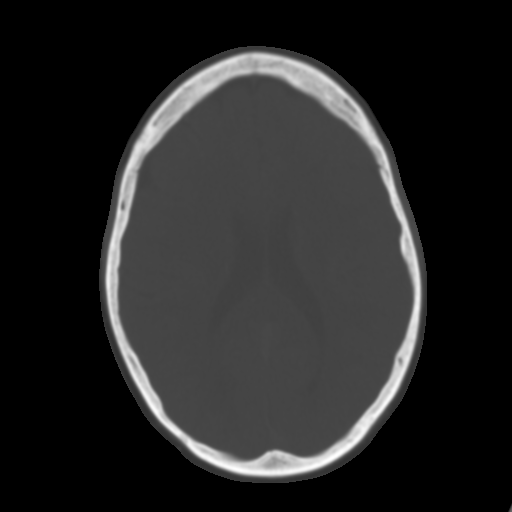
[im 19/31  brain]
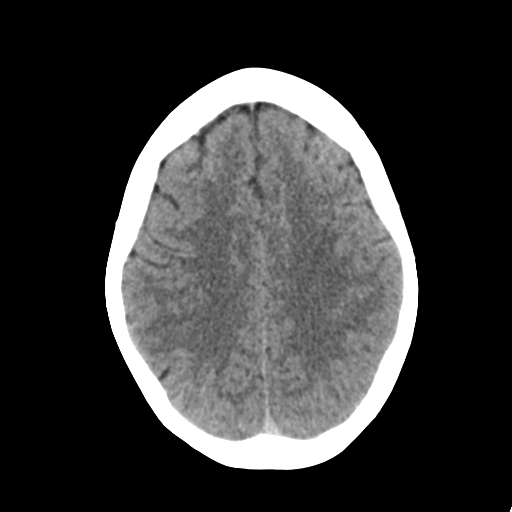
[im 22/31  brain]
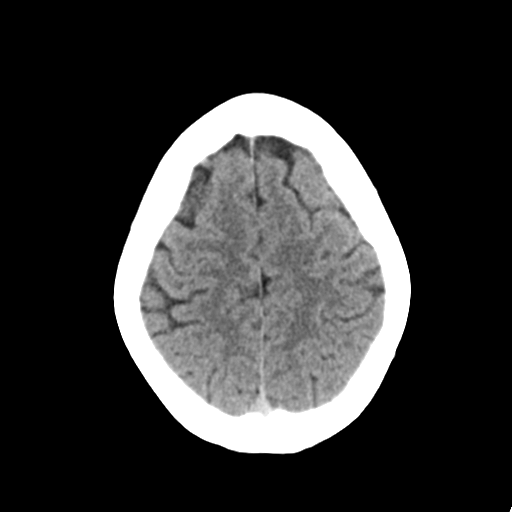
[im 25/31  brain]
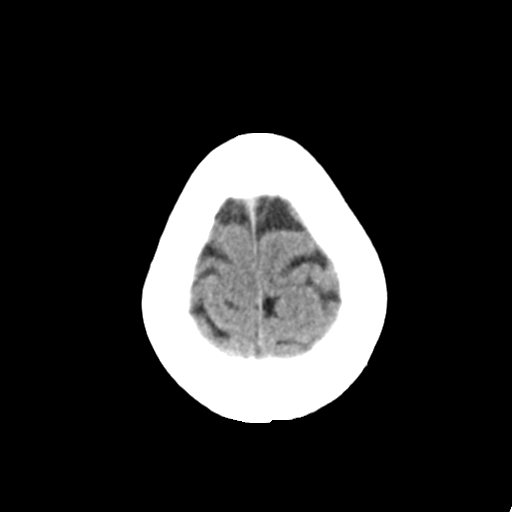
[im 28/31  brain]
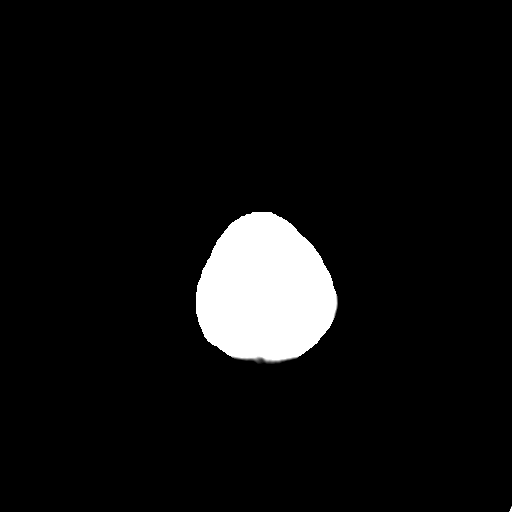
[im 28/31  bone]
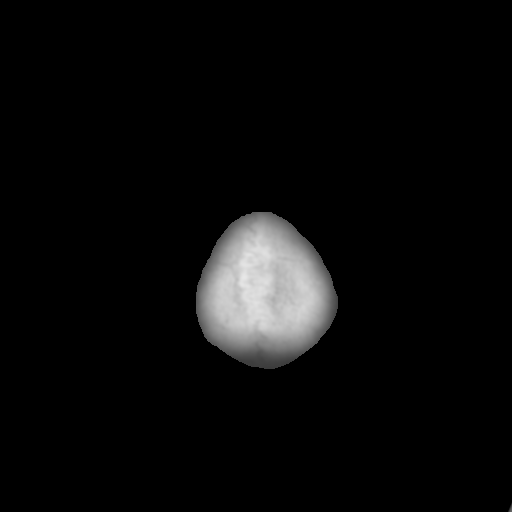

[Series 4: coronal soft tissue · coronal · 0.30mm/px · 3 of 65 slices shown]
[im 22/65  brain]
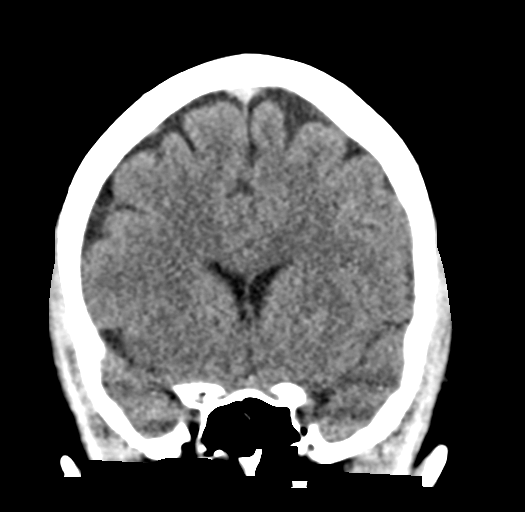
[im 29/65  brain]
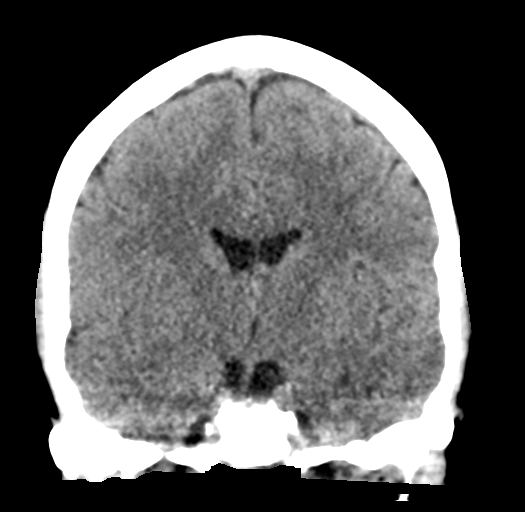
[im 36/65  brain]
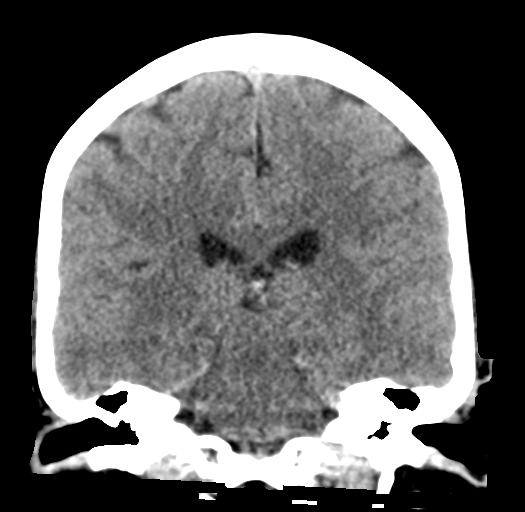

[Series 5: sagittal soft tissue · sagittal · 0.30mm/px · 3 of 52 slices shown]
[im 18/52  brain]
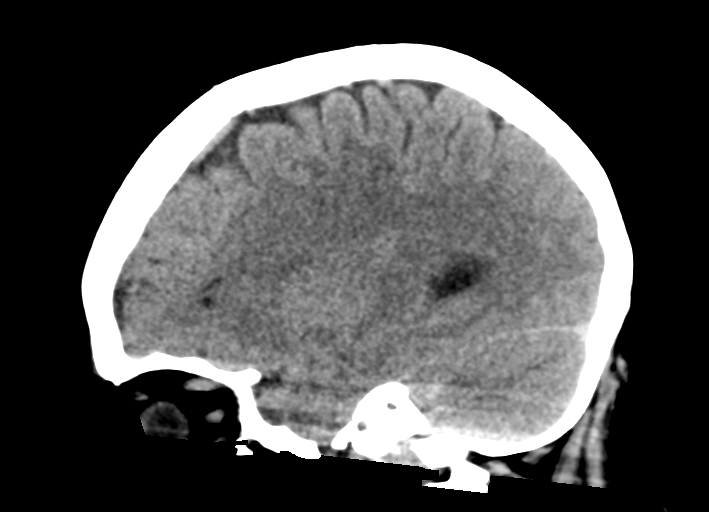
[im 26/52  brain]
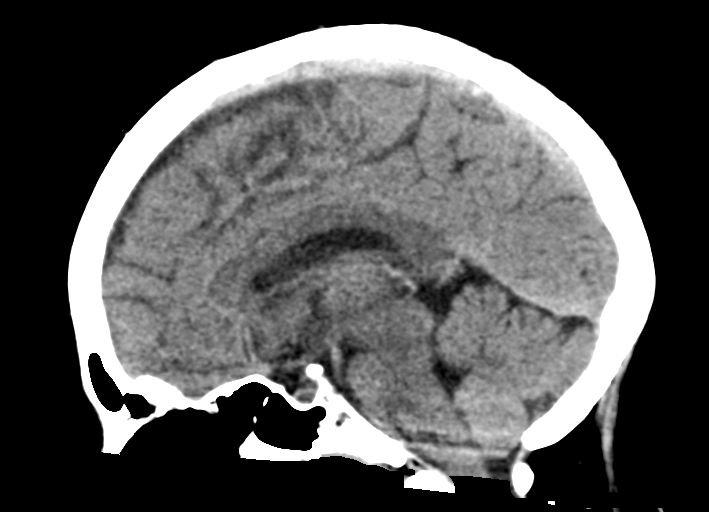
[im 35/52  brain]
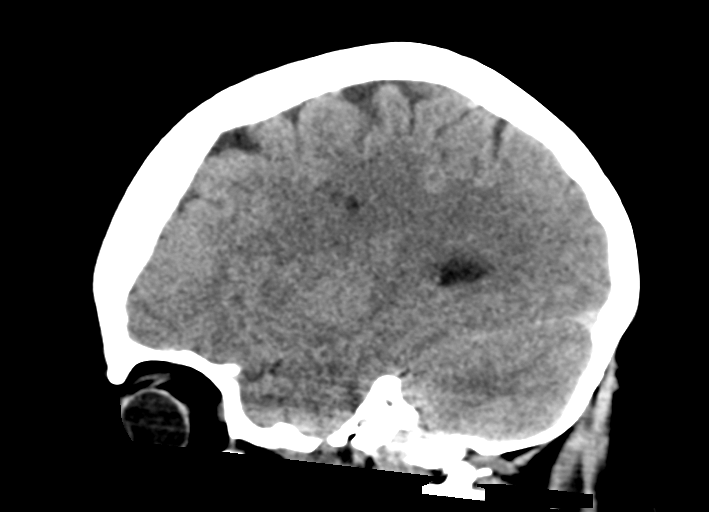

[15 of 47 positions shown; findings below may reference images not displayed]

FINDINGS: Brain: Small white matter infarctions in the LEFT and RIGHT frontal
lobes are again noted. No acute infarction identified by CT.

No midline shift or mass effect. No extra-axial fluid collections.
Basal cisterns are patent.

Vascular: No hyperdense vessel or unexpected calcification.

Skull: Normal. Negative for fracture or focal lesion.

Sinuses/Orbits: No acute finding.

Other: None.
IMPRESSION: 1. No acute intracranial findings.  No change from prior.
2. Bilateral frontal white matter infarctions again noted.

## 2018-03-07 ENCOUNTER — Other Ambulatory Visit: Payer: Self-pay

## 2018-03-07 NOTE — Patient Outreach (Signed)
[facility]) Care Management  03/07/2018  Summer Cook 1966-03-18 [id number]   Telephone Screen Referral Date : 03/04/2018 Referral Source: [facility] ED Census Referral Reason: Ed Utilization Insurance: [place] attempt # 1 To patient.   1st outreach attempt for screening. Family member answered the phone and stated that the patient was asleep.  HIPAA compliant message left with contact information.  Plan: RN Health Coach will send letter. RN Health Coach will make another attempt to the patient within four business days.    Lazaro Arms RN, BSN, [place] Direct Dial:  [phone] Fax: [phone]

## 2018-03-11 ENCOUNTER — Other Ambulatory Visit: Payer: Self-pay

## 2018-03-11 NOTE — Patient Outreach (Signed)
[facility]) Care Management  03/11/2018  Summer Cook 07-Jul-1966 [id number]   2nd attempt to outreach the patient for screening.  Patient answered the phone and stated that she could not hear me well her phone was having problems with her land line.  She gave me a cell number of [phone].  When I called the number it was busy.  Plan: RN Health Coach will make another outreach attempt to the patient within four business days.   Lazaro Arms RN, BSN, [place] Direct Dial:  [phone]  Fax: [phone]

## 2018-03-14 ENCOUNTER — Other Ambulatory Visit: Payer: Self-pay

## 2018-03-14 ENCOUNTER — Encounter (HOSPITAL_COMMUNITY): Payer: Self-pay

## 2018-03-14 ENCOUNTER — Emergency Department (HOSPITAL_COMMUNITY)
Admission: EM | Admit: 2018-03-14 | Discharge: 2018-03-15 | Disposition: A | Discharge status: Home / Self Care | Payer: Medicare HMO | Attending: Emergency Medicine | Admitting: Emergency Medicine

## 2018-03-14 DIAGNOSIS — T40711A Poisoning by cannabis, accidental (unintentional), initial encounter: Secondary | ICD-10-CM

## 2018-03-14 DIAGNOSIS — Z8673 Personal history of transient ischemic attack (TIA), and cerebral infarction without residual deficits: Secondary | ICD-10-CM | POA: Insufficient documentation

## 2018-03-14 DIAGNOSIS — I2510 Atherosclerotic heart disease of native coronary artery without angina pectoris: Secondary | ICD-10-CM | POA: Diagnosis not present

## 2018-03-14 DIAGNOSIS — F329 Major depressive disorder, single episode, unspecified: Secondary | ICD-10-CM | POA: Diagnosis not present

## 2018-03-14 DIAGNOSIS — R42 Dizziness and giddiness: Secondary | ICD-10-CM | POA: Diagnosis not present

## 2018-03-14 DIAGNOSIS — R0902 Hypoxemia: Secondary | ICD-10-CM | POA: Diagnosis not present

## 2018-03-14 DIAGNOSIS — R69 Illness, unspecified: Secondary | ICD-10-CM | POA: Diagnosis not present

## 2018-03-14 DIAGNOSIS — I129 Hypertensive chronic kidney disease with stage 1 through stage 4 chronic kidney disease, or unspecified chronic kidney disease: Secondary | ICD-10-CM | POA: Diagnosis not present

## 2018-03-14 DIAGNOSIS — Z9049 Acquired absence of other specified parts of digestive tract: Secondary | ICD-10-CM | POA: Insufficient documentation

## 2018-03-14 DIAGNOSIS — I447 Left bundle-branch block, unspecified: Secondary | ICD-10-CM | POA: Diagnosis not present

## 2018-03-14 DIAGNOSIS — Z87891 Personal history of nicotine dependence: Secondary | ICD-10-CM | POA: Diagnosis not present

## 2018-03-14 DIAGNOSIS — R4781 Slurred speech: Secondary | ICD-10-CM | POA: Diagnosis not present

## 2018-03-14 DIAGNOSIS — R110 Nausea: Secondary | ICD-10-CM

## 2018-03-14 DIAGNOSIS — I10 Essential (primary) hypertension: Secondary | ICD-10-CM | POA: Diagnosis not present

## 2018-03-14 DIAGNOSIS — I252 Old myocardial infarction: Secondary | ICD-10-CM | POA: Insufficient documentation

## 2018-03-14 DIAGNOSIS — R0689 Other abnormalities of breathing: Secondary | ICD-10-CM | POA: Diagnosis not present

## 2018-03-14 DIAGNOSIS — N183 Chronic kidney disease, stage 3 (moderate): Secondary | ICD-10-CM | POA: Diagnosis not present

## 2018-03-14 DIAGNOSIS — T407X1A Poisoning by cannabis (derivatives), accidental (unintentional), initial encounter: Secondary | ICD-10-CM | POA: Diagnosis not present

## 2018-03-14 DIAGNOSIS — F419 Anxiety disorder, unspecified: Secondary | ICD-10-CM | POA: Diagnosis not present

## 2018-03-14 DIAGNOSIS — J45909 Unspecified asthma, uncomplicated: Secondary | ICD-10-CM | POA: Insufficient documentation

## 2018-03-14 DIAGNOSIS — R112 Nausea with vomiting, unspecified: Secondary | ICD-10-CM | POA: Diagnosis not present

## 2018-03-14 LAB — CBC WITH DIFFERENTIAL/PLATELET
Basophils Absolute: 0.1 10*3/uL (ref 0.0–0.1)
Basophils Relative: 1 %
EOS PCT: 3 %
Eosinophils Absolute: 0.2 10*3/uL (ref 0.0–0.7)
HEMATOCRIT: 38 % (ref 36.0–46.0)
Hemoglobin: 11.8 g/dL — ABNORMAL LOW (ref 12.0–15.0)
LYMPHS ABS: 3 10*3/uL (ref 0.7–4.0)
LYMPHS PCT: 44 %
MCH: 28.5 pg (ref 26.0–34.0)
MCHC: 31.1 g/dL (ref 30.0–36.0)
MCV: 91.8 fL (ref 78.0–100.0)
MONO ABS: 0.8 10*3/uL (ref 0.1–1.0)
Monocytes Relative: 12 %
Neutro Abs: 2.7 10*3/uL (ref 1.7–7.7)
Neutrophils Relative %: 40 %
PLATELETS: 151 10*3/uL (ref 150–400)
RBC: 4.14 MIL/uL (ref 3.87–5.11)
RDW: 13.9 % (ref 11.5–15.5)
WBC: 6.9 10*3/uL (ref 4.0–10.5)

## 2018-03-14 LAB — COMPREHENSIVE METABOLIC PANEL
ALT: 19 U/L (ref 14–54)
AST: 24 U/L (ref 15–41)
Albumin: 3.1 g/dL — ABNORMAL LOW (ref 3.5–5.0)
Alkaline Phosphatase: 70 U/L (ref 38–126)
Anion gap: 7 (ref 5–15)
BUN: 17 mg/dL (ref 6–20)
CHLORIDE: 109 mmol/L (ref 101–111)
CO2: 26 mmol/L (ref 22–32)
CREATININE: 1.8 mg/dL — AB (ref 0.44–1.00)
Calcium: 8.6 mg/dL — ABNORMAL LOW (ref 8.9–10.3)
GFR calc Af Amer: 36 mL/min — ABNORMAL LOW (ref >60)
GFR calc non Af Amer: 31 mL/min — ABNORMAL LOW (ref >60)
Glucose, Bld: 98 mg/dL (ref 65–99)
POTASSIUM: 4.2 mmol/L (ref 3.5–5.1)
Sodium: 142 mmol/L (ref 135–145)
Total Bilirubin: 0.4 mg/dL (ref 0.3–1.2)
Total Protein: 5.8 g/dL — ABNORMAL LOW (ref 6.5–8.1)

## 2018-03-14 LAB — ETHANOL: Alcohol, Ethyl (B): <10 mg/dL (ref <10)

## 2018-03-14 MED ORDER — SODIUM CHLORIDE 0.9 % IV BOLUS
500.0000 mL | Freq: Once | INTRAVENOUS | Status: AC
Start: 2018-03-14 — End: 2018-03-14
  Administered 2018-03-14: 500 mL via INTRAVENOUS

## 2018-03-14 NOTE — Discharge Instructions (Addendum)
You were seen in the ED today with a likely accidental marijuana intoxication causing your symptoms. Call your PCP in the coming week to schedule an appointment. Return to the ED with any new or worsening symptoms.

## 2018-03-14 NOTE — ED Provider Notes (Signed)
Emergency Department Provider Note   I have reviewed the triage vital signs and the nursing notes.   HISTORY  Chief Complaint Near Syncope   HPI Summer Cook is a 52 y.o. female with PMH of CAD, GERD, HTN, Asthma, and prior CVA presents to the emergency department for evaluation of drowsiness being of advertently smoking THC.  Patient states that she was with a family member who said "here try this" without telling her what it was.  She began to vape at which point the family member told her that "that is 95% THC."  She states that almost immediately she began to feel lightheadedness and nausea.  She had one episode of vomiting but denies any chest pain or abdominal discomfort.  She continues to feel very drowsy "like I'm in a dream."  He denies smoking marijuana in the past.  She does take hydrocodone for chronic arthritis pain and took some of that around the same time. Did not take more prescription meds than normal.    Past Medical History:  Diagnosis Date  . Allergy   . Anxiety   . Asthma   . CAD (coronary artery disease), native coronary artery    a. cath 05/20/17 - 100% distal PDA --> medical mangement (no intervention done)  . Candida esophagitis ([place])   . Chest pain at rest 04/2017  . Depression   . Essential hypertension, benign   . GERD (gastroesophageal reflux disease)   . Gout   . Hemorrhoids   . 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  Last was Prednisone '40mg'$ ; 2 wk in 10/2012.  She also was on Prednisone bridged with Cellcept briefly but stopped due to lack of insurance.   . Myocardial infarction ([place])   . Pulmonary embolism ([place]) 04/2012  . Renal insufficiency   . Serrated adenoma of colon   . Steroid-induced diabetes ([place])   . Stroke ([place])   . Thrush, oral 05/29/2011    Patient Active Problem List   Diagnosis Date Noted  . Unstable angina ([place])  05/29/2017  . CAD (coronary artery disease): total distal RCA EF normal (05/20/17) 05/25/2017  . UTI (urinary tract infection) 05/25/2017  . Chest pain with moderate risk for cardiac etiology 05/23/2017  . Mixed hyperlipidemia   . NSTEMI (non-ST elevated myocardial infarction) ([place]) 05/20/2017  . Diabetes mellitus type 2, uncomplicated ([place]) 23/76/2831  . History of stroke   . Chronic back pain 12/23/2015  . Hypotension 02/16/2015  . Asthma, chronic 02/16/2015  . Anxiety state 02/16/2015  . CKD (chronic kidney disease) stage 3, GFR 30-59 ml/min (HCC) 04/28/2014  . Nausea with vomiting 01/25/2013  . Abnormal LFTs 01/25/2013  . Orthostatic hypotension 01/25/2013  . Rectal bleeding 12/26/2012  . History of pulmonary embolism 05/03/2012  . Hemorrhoids 06/04/2011  . 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 Immunosuppressed status ([place]) 06/04/2011  . Depression 06/04/2011  . Obesity 06/04/2011  . Idiopathic thrombocytopenic purpura ([place]) 03/18/2011    Past Surgical History:  Procedure Laterality Date  . BONE MARROW BIOPSY    . CARPAL TUNNEL RELEASE    . CARPAL TUNNEL RELEASE Left 05/20/2016   Procedure: CARPAL TUNNEL RELEASE;  Surgeon: Carole Civil, MD;  Location: [facility];  Service: Orthopedics;  Laterality: Left;  . CARPAL TUNNEL RELEASE Right 07/16/2016   Procedure: RIGHT CARPAL TUNNEL RELEASE;  Surgeon: Carole Civil, MD;  Location: [facility];  Service: Orthopedics;  Laterality: Right;  .  CATARACT EXTRACTION    . CHOLECYSTECTOMY  2008  . COLONOSCOPY WITH ESOPHAGOGASTRODUODENOSCOPY (EGD) N/A 01/04/2013   Dr. Gala Romney: esophageal plaques with +KOH, hh. Gastric antrum abnormal, bx reactive gastropathy. Anal canal hemorrhoids, colonic tics, normal TI, single polyp (sessile serrated tubular adenoma). Next TCS 12/2017  . LEFT HEART CATH AND CORONARY ANGIOGRAPHY N/A 05/20/2017   Procedure: LEFT HEART CATH AND CORONARY ANGIOGRAPHY;  Surgeon: Martinique, Peter M, MD;  Location: [facility];   Service: Cardiovascular;  Laterality: N/A;  . 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 TEE WITHOUT CARDIOVERSION N/A 01/03/2016   Procedure: TRANSESOPHAGEAL ECHOCARDIOGRAM (TEE) WITH PROPOFOL;  Surgeon: Satira Sark, MD;  Location: [facility];  Service: Cardiovascular;  Laterality: N/A;    Current Outpatient Rx  . Order #: [id number] Class: Historical Med  . Order #: [id number] Class: OTC  . Order #: [id number] Class: No Print  . Order #: [id number] Class: Print  . Order #: [id number] Class: Historical Med  . Order #: [id number] Class: Historical Med  . Order #: [id number] Class: Historical Med  . Order #: [id number] Class: Print  . Order #: [id number] Class: Historical Med  . Order #: [id number] Class: Normal  . Order #: [id number] Class: Print  . Order #: [id number] Class: Historical Med    Allergies Brintellix [vortioxetine]; Yellow jacket venom; Adhesive [tape]; and Zoloft [sertraline hcl]  Family History  Problem Relation Age of Onset  . Cervical cancer Mother   . Lung cancer Father   . Colon polyps Father        ???  . Pneumonia Brother   . Colon cancer Paternal Grandmother 88  . AAA (abdominal aortic aneurysm) Paternal Grandmother   . Breast cancer Paternal Grandmother   . Colon cancer Paternal Grandfather 21  . Barrett's esophagus Paternal Aunt   . Stomach cancer Neg Hx   . Pancreatic cancer Neg Hx     Social History Social History   Tobacco Use  . Smoking status: Former Smoker    Years: 0.00    Types: Cigarettes    Last attempt to quit: 02/14/2009    Years since quitting: 9.0  . Smokeless tobacco: Never Used  Substance Use Topics  . Alcohol use: No    Alcohol/week: 0.0 oz  . Drug use: No    Review of Systems  Constitutional: No fever/chills. Positive lightheadedness.  Eyes: No visual changes. ENT: No sore throat. Cardiovascular: Denies chest pain. Respiratory: Denies shortness of breath. Gastrointestinal: No abdominal pain. Positive nausea and vomiting (resolved).  No  diarrhea.  No constipation. Genitourinary: Negative for dysuria. Musculoskeletal: Negative for back pain. Skin: Negative for rash. Neurological: Negative for headaches, focal weakness or numbness.  10-point ROS otherwise negative.  ____________________________________________   PHYSICAL EXAM:  VITAL SIGNS: ED Triage Vitals  Enc Vitals Group     BP 03/14/18 1755 (!) 142/89     Pulse Rate 03/14/18 1755 78     Resp 03/14/18 1755 16     Temp 03/14/18 1755 97.7 F (36.5 C)     Temp Source 03/14/18 1755 Oral     SpO2 03/14/18 1755 94 %     Weight 03/14/18 1754 180 lb (81.6 kg)     Height 03/14/18 1754 '5\' 4"'$  (1.626 m)     Pain Score 03/14/18 1754 0   Constitutional: Drowsy but answering questions without difficulty.  Eyes: Conjunctivae are normal. PERRL. EOMI. Head: Atraumatic. Nose: No congestion/rhinnorhea. Mouth/Throat: Mucous membranes are moist. Neck: No stridor. Cardiovascular: Normal rate, regular rhythm. Good peripheral circulation. Grossly normal heart sounds.   Respiratory:  Normal respiratory effort.  No retractions. Lungs CTAB. Gastrointestinal: Soft and nontender. No distention.  Musculoskeletal: No lower extremity tenderness nor edema. No gross deformities of extremities. Neurologic:  Normal speech and language. No gross focal neurologic deficits are appreciated. Normal CN exam 2-12. No pronator drift. Normal, equal strength in the upper and lower extremities.  Skin:  Skin is warm, dry and intact. No rash noted.  ____________________________________________   LABS (all labs ordered are listed, but only abnormal results are displayed)  Labs Reviewed  COMPREHENSIVE METABOLIC PANEL - Abnormal; Notable for the following components:      Result Value   Creatinine, Ser 1.80 (*)    Calcium 8.6 (*)    Total Protein 5.8 (*)    Albumin 3.1 (*)    GFR calc non Af Amer 31 (*)    GFR calc Af Amer 36 (*)    All other components within normal limits  CBC WITH  DIFFERENTIAL/PLATELET - Abnormal; Notable for the following components:   Hemoglobin 11.8 (*)    All other components within normal limits  ETHANOL   ____________________________________________  EKG   EKG Interpretation  Date/Time:  Monday March 14 2018 18:08:00 EDT Ventricular Rate:  77 PR Interval:    QRS Duration: 142 QT Interval:  439 QTC Calculation: 497 R Axis:   153 Text Interpretation:  Sinus rhythm Nonspecific intraventricular conduction delay Probable anteroseptal infarct, old No STEMI.  Similar to May 2019 tracing.  Confirmed by Nanda Quinton [phone]) on 03/14/2018 6:28:50 PM       ____________________________________________  RADIOLOGY  None ____________________________________________   PROCEDURES  Procedure(s) performed:   Procedures  None ____________________________________________   INITIAL IMPRESSION / ASSESSMENT AND PLAN / ED COURSE  Pertinent labs & imaging results that were available during my care of the patient were reviewed by me and considered in my medical decision making (see chart for details).  Patient presents to the emergency department for evaluation of lightheadedness and nausea in the setting of inadvertently smoking THC which was given to her by family member.  Symptoms began abruptly after smoking this.  She was feeling well prior to smoking the drug.  Patient has no focal neurological deficits.  She seems generally tired and somewhat drowsy.  Plan for ED monitoring with baseline labs and IV fluids.  She states her nausea has resolved.  09:02 PM Patient starting to feel better. Labs reviewed without acute findings. U tox pending. Plan for continued observation.   10:30 PM Reassessed the patient. She is feeling better. She is more awake and alert. No nausea or vomiting here. Plan for discharge home with plan for PCP follow up in the coming 24-28 hours.   Patient ambulatory in the ED without difficulty. Will discharge.   At this  time, I do not feel there is any life-threatening condition present. I have reviewed and discussed all results (EKG, imaging, lab, urine as appropriate), exam findings with patient. I have reviewed nursing notes and appropriate previous records.  I feel the patient is safe to be discharged home without further emergent workup. Discussed usual and customary return precautions. Patient and family (if present) verbalize understanding and are comfortable with this plan.  Patient will follow-up with their primary care provider. If they do not have a primary care provider, information for follow-up has been provided to them. All questions have been answered.  ____________________________________________  FINAL CLINICAL IMPRESSION(S) / ED DIAGNOSES  Final diagnoses:  Lightheadedness  Nausea  Accidental cannabis overdose, initial encounter  MEDICATIONS GIVEN DURING THIS VISIT:  Medications  sodium chloride 0.9 % bolus 500 mL (0 mLs Intravenous Stopped 03/14/18 2015)    Note:  This document was prepared using Dragon voice recognition software and may include unintentional dictation errors.  Nanda Quinton, MD Emergency Medicine    Ladainian Therien, Wonda Old[facility], MD 03/15/18 [phone]

## 2018-03-14 NOTE — ED Triage Notes (Signed)
Daughter told ems pt had been going "in and out of consciousness."  Reports pt vaped 95% THC but didn't know it was THC.  EMS gave 411ml bolus NSS.  Reports bp was initially 102/76.  cbg 100.  Pt says had never tried THC before when she vaped.  Pt alert but drowsy.  EMS says daughter noticed pt acting this way at 5:30 this evening.  Reports vaped at 4:30.

## 2018-03-14 NOTE — ED Notes (Signed)
Pt ambulates normally.

## 2018-03-14 NOTE — ED Notes (Signed)
Checked with pt for urine sample,pt can't go right now will check back in 20minutes

## 2018-03-14 NOTE — ED Notes (Signed)
If discharged or admitted call daughter at [phone]

## 2018-03-16 ENCOUNTER — Ambulatory Visit: Payer: Self-pay

## 2018-03-17 ENCOUNTER — Other Ambulatory Visit: Payer: Self-pay

## 2018-03-17 NOTE — Patient Outreach (Signed)
[place] [facility]) Care Management  03/17/2018  [name] 1966-06-11 [id number]   Telephone Screen Referral Date : 03/04/2018 Referral Source: [facility] ED Census Referral Reason: Ed Utilization Insurance: [place] attempt # 3 To patient.   3rd  outreach attempt for screening. Family member answered the phone and stated that the patient was on another line and unable to come to the phone.  HIPAA compliant message left with contact information.   Plan: If no response to calls and letter in ten business days. Rn health Coach will proceed with case closure.   Lazaro Arms RN, BSN, [place] Direct Dial:  [phone]  Fax: [phone]

## 2018-03-18 ENCOUNTER — Other Ambulatory Visit: Payer: Self-pay

## 2018-03-18 NOTE — Patient Outreach (Signed)
[place] [facility]) Care Management  03/18/2018  CLYDETTE PRIVITERA [birth year] [id number]   Patient has not responded to calls or letter.   Plan: [facility] will proceed with case closure due to inability to contact.  Lazaro Arms RN, BSN, [place] Direct Dial:  [phone]  Fax: [phone]

## 2018-04-12 NOTE — Addendum Note (Signed)
Addended byCalla Kicks T on: 04/12/2018 12:25 PM   Modules accepted: Level of Service, SmartSet

## 2018-04-12 NOTE — Progress Notes (Signed)
This encounter was created in error - please disregard.

## 2018-04-13 IMAGING — DX DG CHEST 2V
2 series · 2 of 2 positions shown · non-contrast
Comparison: 09/25/2016

CLINICAL DATA: Cough

EXAM:
CHEST  2 VIEW

[chest pa]
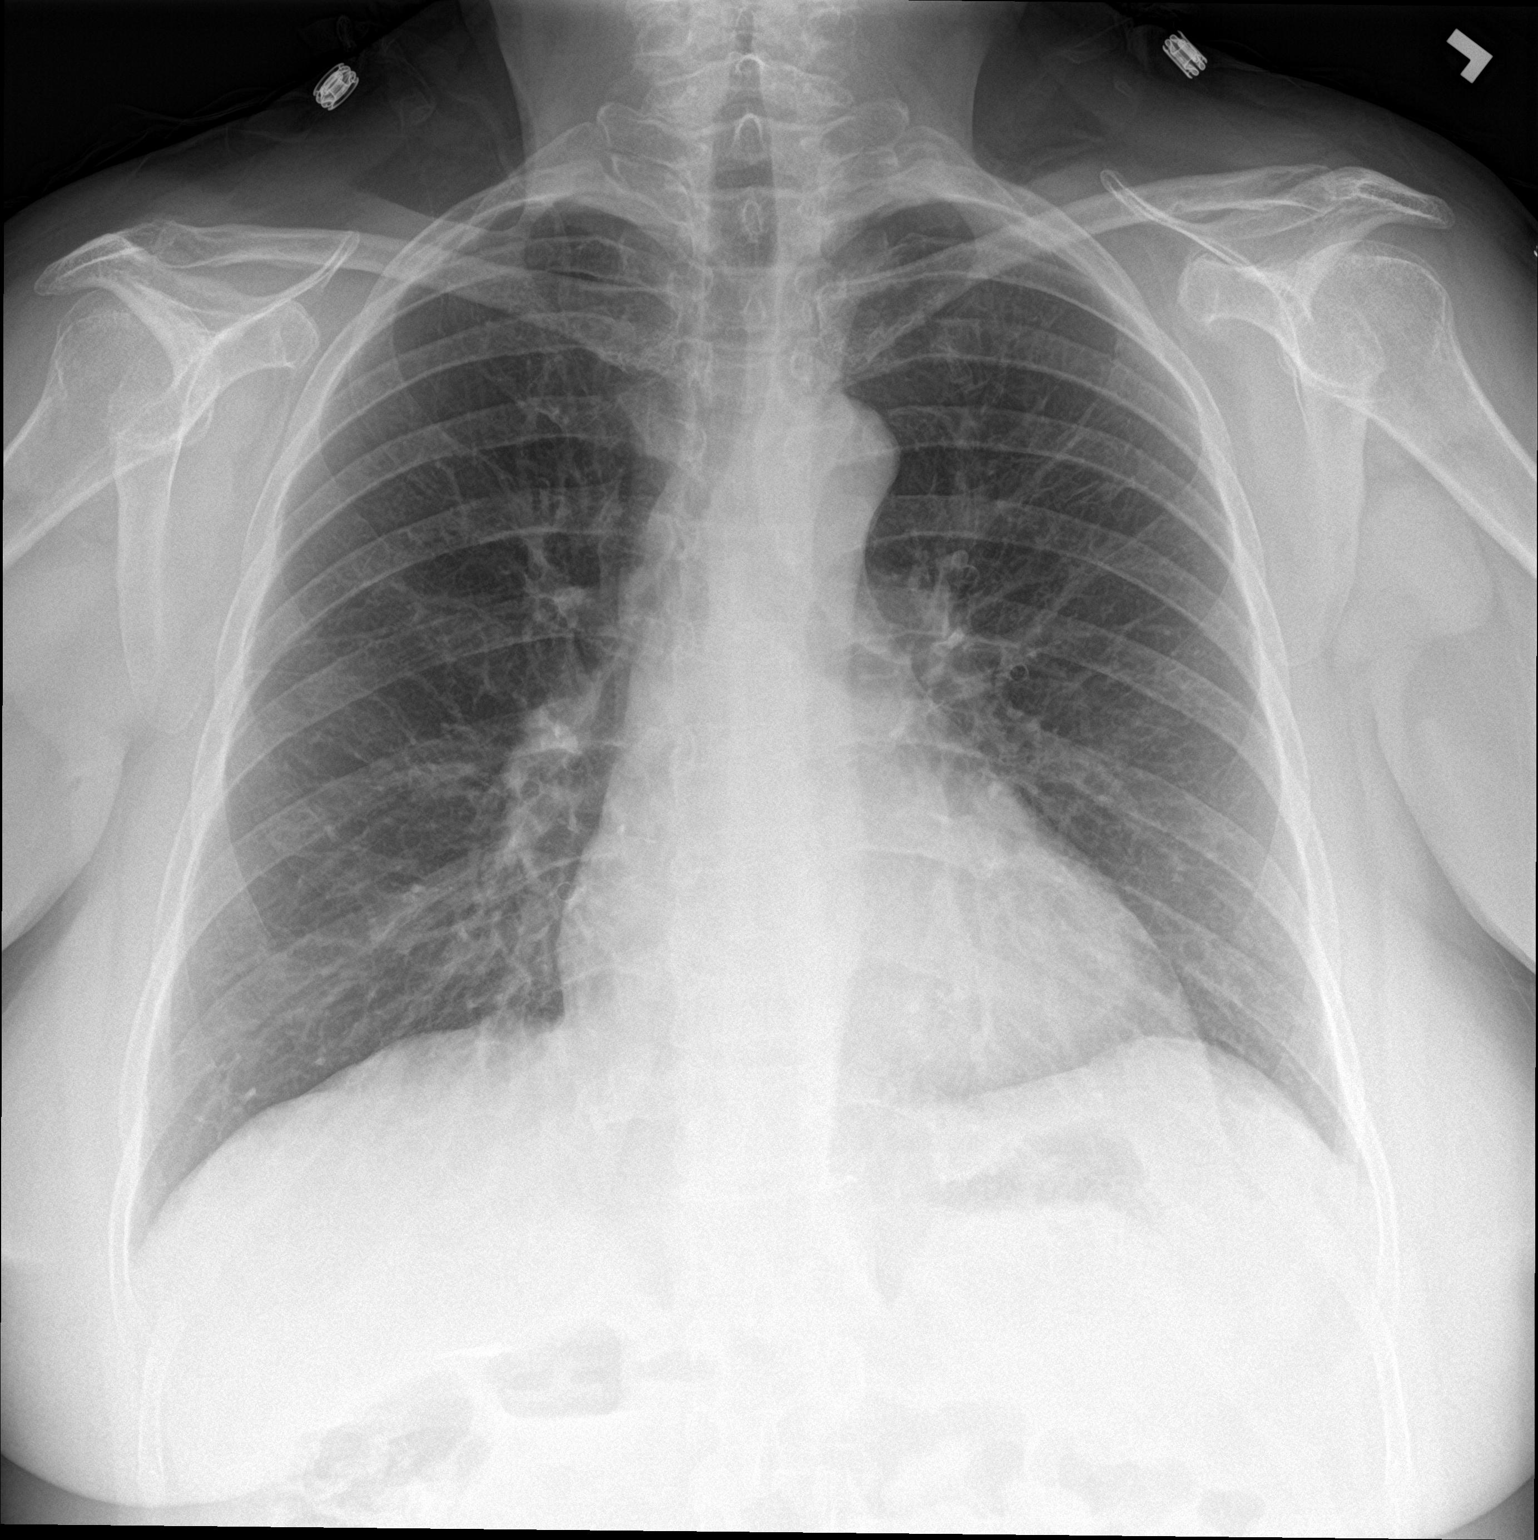

[chest lat]
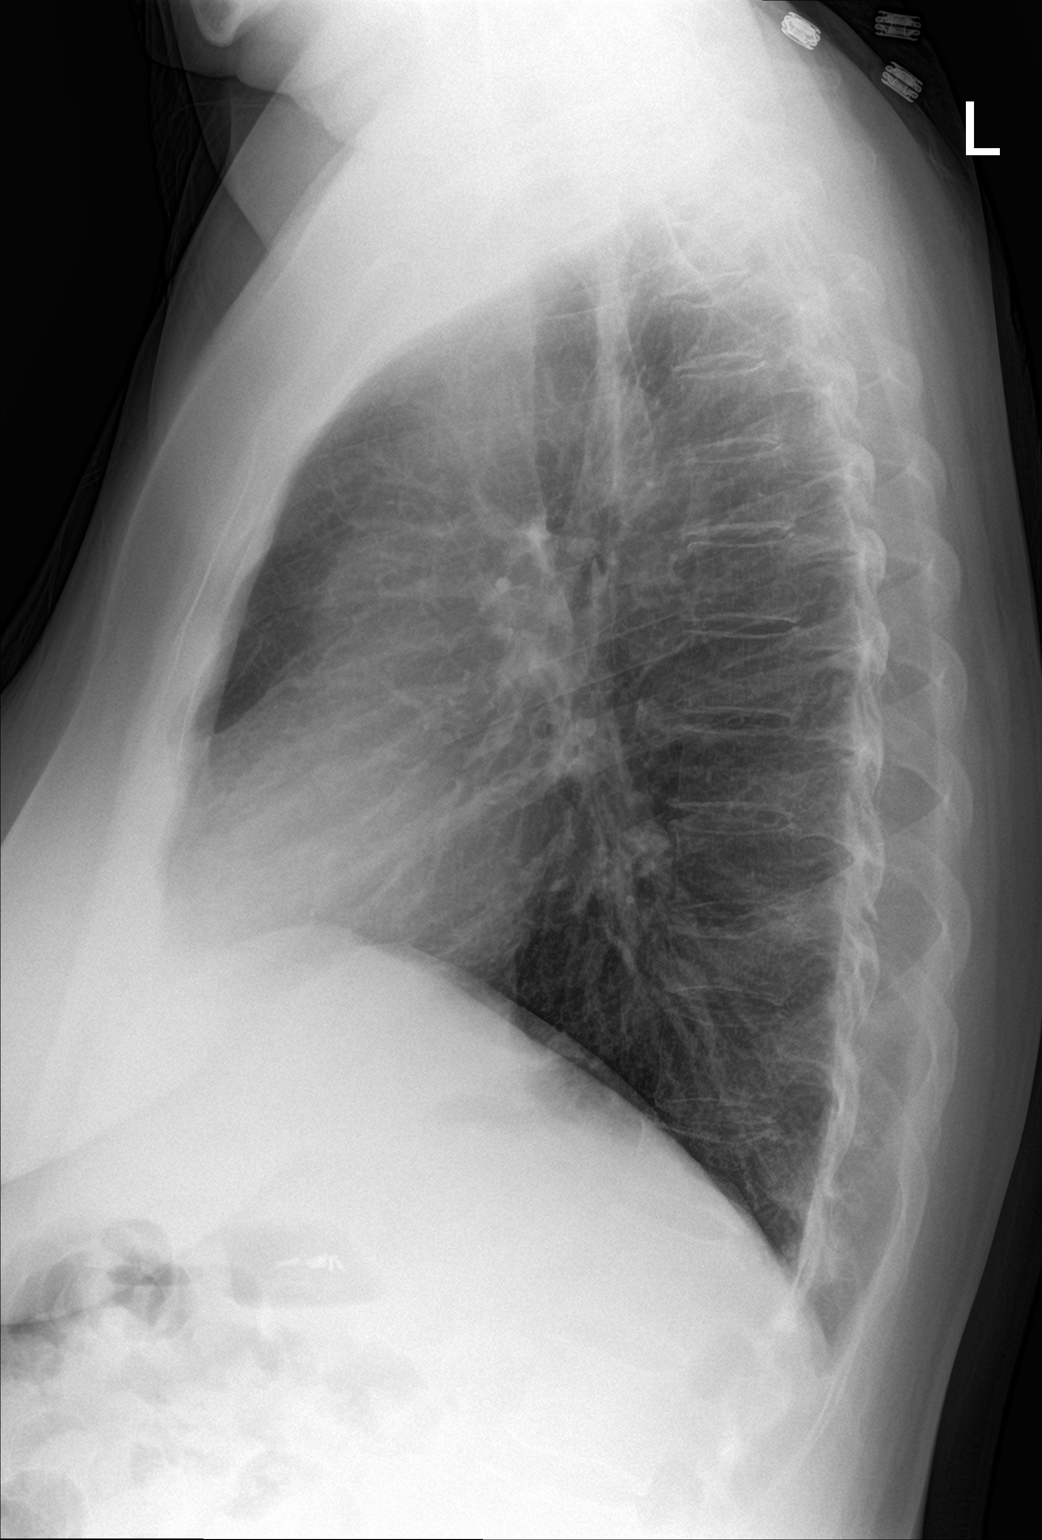

[2 of 2 positions shown; findings below may reference images not displayed]

FINDINGS: The heart size and mediastinal contours are within normal limits.
Both lungs are clear. The visualized skeletal structures are
unremarkable. Surgical clips in the right upper quadrant
IMPRESSION: No active cardiopulmonary disease.

## 2018-04-15 ENCOUNTER — Other Ambulatory Visit: Payer: Self-pay | Admitting: *Deleted

## 2018-04-15 NOTE — Patient Outreach (Signed)
[facility] [facility]) Care Management  04/15/2018  Summer Cook December 16, 1965 [id number]   Telephone Screen  Referral Date: 04/12/18 Referral Source: High ER utilizers- [employer] Referral Reason: to call to engage for possible [facility] CM services Insurance: [place] attempt # 1 [facility] RN CM left HIPAA compliant voicemail message along with CM's contact info with her daughter, Summer Cook  Plan: [facility] RN CM sent an unsuccessful outreach letter and scheduled this patient for another call attempt within 4 business days  [employer] Lavina Hamman, RN, BSN, CCM [facility] Telephonic Care Management Care Coordinator Direct number [phone]  Main [facility] number [phone] Fax number [phone]

## 2018-04-18 ENCOUNTER — Other Ambulatory Visit: Payer: Self-pay | Admitting: *Deleted

## 2018-04-18 NOTE — Patient Outreach (Signed)
[place] [facility]) Care Management  04/18/2018  Summer Cook 01-14-1966 [id number]   Telephone Screen  Referral Date: 04/12/18 Referral Source: High ER utilizers- [employer] Referral Reason: to call to engage for possible [facility] CM services Insurance: [place] attempt # 2 [facility] RN CM left HIPAA compliant voicemail message along with CM's contact info with a female (hoarse voice)  Plan: [facility] RN CM scheduled this patient for another call attempt within 4 business days  Janie Strothman L. Lavina Hamman, RN, BSN, CCM [facility] Telephonic Care Management Care Coordinator Direct number [phone]  Main [facility] number [phone] Fax number [phone]

## 2018-04-18 NOTE — Patient Outreach (Addendum)
[place] [facility]) Care Management  04/18/2018  Summer Cook 09-25-1966 [id number]   Telephone Screen  Referral Date: 04/12/18 Referral Source: High ER utilizers- [employer] Referral Reason: to call to engage for possible [facility] CM services Insurance: [place]  This patient confirms she changed from [employer] to [employer] about 1- 2 months ago when trying to get a better plan for possible eye surgery (see below)  THIN CMA updated on new coverage and ID number  Ms Cando returned a call to [facility] RN CM Patient is able to verify HIPAA Reviewed and addressed referral to [facility] with patient She confirms she has been in the ED frequently related to various s/s and some s/s related to her desire to get on a new depression medication that she can tolerate and not have side effects from. She also voices concern about a need to have eye surgery and not being able to afford the cost of the surgical center ($215 cost for her eye MD Luberta Mutter to use the surgical center)  She has 6 Ed visits in the last 6 months 03/14/18 for light head nausea, cannabis OD She reports  Her nephew gave her a VAPE with cannabis without her knowledge that cannabis was in the [place] 02/12/18 abdominal pain related to medications 12/11/17 doge bite on left hand and chest pain, pulmonary nodule 11/27/17 right side chest pain 11/23/17 acute chest pain 11/14/17 precordial pain  Social: Ms Thrush lives with her daughters and grandchildren 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  She is independent with ADLs an iADLs   Conditions: Anxiety/depression, hypotension, asthma, rectal bleeding, Diabetes type 2, nausea/vomiting, pt reports an autoimmune disease, hx of cataracts removal at an early age and implants placed related to use of prednisone.   She reports the need to have laser eye surgery on both eyes but especially the left eye but unable to afford  it.  She states she thought if she changed insurance plans in May/June 2019 it may help her but found out it did not.   Medications: She reports that her primary MD has been managing her depression/anxiety medications. She reports getting medications at a discounted price with the use of LIS (low income subsidy) program It pays her premium for medicare and helps with Rx cost.  She reports she is still taking xanax prn but is now longer taking lexapro nor Zoloft related to GI side effects   Appointments: She has an appointment on 04/19/18 Tuesday at 3 pm with primary MD Dr Gerarda Fraction to adjust her depression medication Prior to that she had seen Dr Gerarda Fraction in May 2019   Advance Directives: She states she does not have advance directives and does not think she needs advance directives at this time.  Consent: [facility] RN CM reviewed [facility] services with patient. Patient gave verbal consent for services from [place]: [facility] RN CM will refer Ms Kubota to [facility] for possible financial assistance or possible resource for laser eye surgery. [facility] RN CM discussed that there is possible not a resource available but she feels it is worth an attempt to see if there is a resource to assist her. 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 for [employer]  [address] [place] [address]  [phone]  [facility] RN CM discussed the [place] center as specialist that may be able to manage her depression medication   Audwin Semper L. Lavina Hamman, RN, BSN, CCM [facility] Telephonic Care Management Care Coordinator Direct number [phone]  Main [facility] number [phone] Fax number [phone]

## 2018-04-19 DIAGNOSIS — E663 Overweight: Secondary | ICD-10-CM | POA: Diagnosis not present

## 2018-04-19 DIAGNOSIS — R498 Other voice and resonance disorders: Secondary | ICD-10-CM | POA: Diagnosis not present

## 2018-04-19 DIAGNOSIS — Z6829 Body mass index (BMI) 29.0-29.9, adult: Secondary | ICD-10-CM | POA: Diagnosis not present

## 2018-04-19 DIAGNOSIS — J329 Chronic sinusitis, unspecified: Secondary | ICD-10-CM | POA: Diagnosis not present

## 2018-04-19 DIAGNOSIS — N184 Chronic kidney disease, stage 4 (severe): Secondary | ICD-10-CM | POA: Diagnosis not present

## 2018-04-19 DIAGNOSIS — I2510 Atherosclerotic heart disease of native coronary artery without angina pectoris: Secondary | ICD-10-CM | POA: Diagnosis not present

## 2018-04-19 DIAGNOSIS — J042 Acute laryngotracheitis: Secondary | ICD-10-CM | POA: Diagnosis not present

## 2018-04-20 ENCOUNTER — Other Ambulatory Visit: Payer: Self-pay | Admitting: *Deleted

## 2018-04-20 NOTE — Patient Outreach (Addendum)
[place] [facility]) Care Management  04/20/2018  Summer Cook Jan 30, 1966 [id number]   Care coordination  Call from [place] at [place] to review Summer Cook concerns with laser surgery cost [name] provided with the [facility] gold plus ID  [id number] provided by Summer Cook to check on price for procedure and will return a call to [facility] RN CM  Plans Continue to collaborate with [facility], Dr [name] office, patient and [facility] for possible assist with laser surgery   Kimberly L. Lavina Hamman, RN, BSN, CCM [facility] Telephonic Care Management Care Coordinator Direct number [phone]  Main [facility] number [phone] Fax number [phone]

## 2018-04-20 NOTE — Patient Outreach (Addendum)
[place] [facility]) Care Management  04/20/2018  Summer Cook May 03, 1966 [id number]   Care coordination   Missed a call from [name] at Dr [name] office  Returned a call and left a message at ext [phone]  Returned a call to the operator  Noted [facility] referral in basket message for possible resources  CM spoke with [name] at Dr [name] office to confirm [name] was offered bilateral laser eye procedure during 09/10/16 There is a fee to the [facility] that is not associated with Dr [name] fee.  [name] and [name] will speak with Dr [name] about during her office time on 04/22/18 ( ? Waiving fee)  Cm left a message at the [place] [phone]) for [name] in billing to inquire about resource/ services Pending a return call   Plan Continue to collaborate with [facility] (sent in basket message), Dr [name] office, patient and [facility] for possible assist with laser surgery  Kimberly L. Lavina Hamman, RN, BSN, CCM [facility] Telephonic Care Management Care Coordinator Direct number [phone]  Main [facility] number [phone] Fax number [phone]

## 2018-04-20 NOTE — Patient Outreach (Addendum)
[place] [facility]) Care Management  04/20/2018  CHARLETHA DALPE 04-Sep-1966 [id number]   Care coordination  [facility] RN CM called Dr [name] office to collaborate with this office on possible coordination of care for laser surgery Left a voice message at Dr [name] office in the mailbox for Dr [name] when transferred to the nurse. Pending a return call to get an update on the process/progress for laser surgery for this patient Further noted medical condition/hx - Idiopathic thrombocytopenic purpura   Plans Continue to collaborate with [facility], Dr [name] office and Mrs Maryse Brierley L. Lavina Hamman, RN, BSN, CCM [facility] Telephonic Care Management Care Coordinator Direct number [phone]  Main [facility] number [phone] Fax number [phone]

## 2018-04-21 ENCOUNTER — Other Ambulatory Visit: Payer: Self-pay | Admitting: *Deleted

## 2018-04-21 NOTE — Patient Outreach (Addendum)
[facility]) Care Management  04/21/2018  Summer Cook 04-08-1966 [id number]   Care coordination   [facility] RN CM received a voice message from [name] from [place]  Left a voice message thanking [name] for her assistance related to this patient and inquiring about possible payment plan Cm updated [facility] on the [facility] fee  Cm called [name] to attempt to update her but there was not an answer and her mobile number voice mail box is not set up at this time Cm called her home number.and was able to speak with her  She informs Cm she has been sick for a few days, went to see her MD on Monday, is on an antibiotic and reports she has a respiratory issue CM reviewed the calls to Dr [name], [place] and Surg[facility]enter She states "that sounds good' and states she would be interested in and possibly able to get procedure done with assist with partial or the entire fee. CM encouraged her to call Dr [name] office to discuss the procedure and possible MD fee.   Left a message at the Surgicenter for [name] for Dr [name] to update them about fee from [name], [name] interest in procedure and [name] should be calling to speak with someone in Dr [name] office hopefully soon   Plans: Continue to collaborate with [name], Dr [name] office, patient and Surgicenter for possible assist with laser surgery    [name], RN, BSN, CCM [facility] Telephonic Care Management Care Coordinator Direct number [phone]  Main [facility] number [phone] Fax number [phone]

## 2018-04-21 NOTE — Patient Outreach (Signed)
[facility]) Care Management  04/21/2018  Summer Cook 02-May-1966 [id number]   Care coordination  Return call from [place] at [place] who has also updated [name] that she can do a payment plan (1/3 of cost up front) for laser surgery x 2 (each eye will be done separately)   Plan Pending [name] contact with Dr [name]  [facility] CM updated [place]. Lavina Hamman, RN, BSN, CCM [facility] Telephonic Care Management Care Coordinator Direct number [phone]  Main [facility] number [phone] Fax number [phone]

## 2018-04-22 ENCOUNTER — Other Ambulatory Visit: Payer: Self-pay | Admitting: *Deleted

## 2018-04-22 NOTE — Patient Outreach (Signed)
[facility]) Care Management  04/22/2018  Summer Cook 11/18/1965 [id number]   Care coordination  [facility] RN CM received a call from Mr Wendie Agreste, Administrator at Dr Georjean Mode office [phone])after he reports receiving a note inquiring about waiving a fee for Dr Georjean Mode for [name].   [facility] RN CM updated and provided clarity to him on the contact with pt, [name] and [name] at Dr Georjean Mode office, the patient and the staff at the [place]. Discussed MD fee waiver mention by Dr Georjean Mode staff and[facility] RN CM has already made [name] aware that a MD fee waiver is a decision of Dr Georjean Mode office plus [name] was encouraged to contact Dr [name] office to discuss this with along with her decision to proceed with laser surgery.  Mr Jerline Pain stated the MD fee will not be waived and confirmed the pt had not been seen since 2017-2018.  [facility] RN CM thanked him for his call  Plans [facility] RN CM will follow up with [name] and [facility] within 7-10 business days  [place] L. Lavina Hamman, RN, BSN, CCM [facility] Telephonic Care Management Care Coordinator Direct number [phone]  Main [facility] number [phone] Fax number [phone]

## 2018-04-25 ENCOUNTER — Other Ambulatory Visit: Payer: Self-pay

## 2018-04-25 ENCOUNTER — Other Ambulatory Visit: Payer: Self-pay | Admitting: *Deleted

## 2018-04-25 NOTE — Patient Outreach (Signed)
[facility]) Care Management  04/25/2018  JILDA KRESS [birth year] [id number]  First outreach to Ms. Crosby regarding social work referral.  Ms. Jonas was driving to an appointment and asked that I call her back tomorrow.  BSW will follow up then.   Ronn Melena, BSW Social Worker [phone]

## 2018-04-25 NOTE — Patient Outreach (Signed)
[facility]) Care Management  04/25/2018  JERMEKA SCHLOTTERBECK [birth year] [id number]   Care coordination  [facility] RN CM left a voice message for Mrs Tuller at the listed home number in [place] RN CM attempted to reach her at the mobile number Mrs Gosdin provided but this number has a mail box still not yet set up No message could be left  Plans follow up and prepare for case closure Unsuccessful out reach letter sent    Joelene Millin L. Lavina Hamman, RN, BSN, CCM [facility] Telephonic Care Management Care Coordinator Direct number [phone]  Main [facility] number [phone] Fax number [phone]

## 2018-04-26 ENCOUNTER — Other Ambulatory Visit: Payer: Self-pay

## 2018-04-26 NOTE — Patient Outreach (Signed)
[facility]) Care Management  04/26/2018  [name] Aug 05, 1966 [id number]   Social work referral received on 04/18/18 for financial assistance or possible resource for laser eye surgery.   BSW communicated with [name] from the [employer] about whether or not they can provide assistance for this type of procedure.  He reported that they usually just offer assistance with eye exams and/or glasses but he said that a written request could be mailed and the board would review it during the next meeting on 05/12/18. BSW spoke with [name] to gather information about the needed surgery and to better understand how much financial assistance she is in need of. BSW offered to submit written request to [employer] but she declined.   Per [name], she made an eye appointment at [facility] in September because she would like another opinion about the best course of treatment and because the surgery may cost less with this provider.  [name] reported that this surgery was recommended 1.5-2 years ago and that she "should have put money back each month" in order to afford it.  Per [name], if the procedure is not cheaper with [facility] she will proceed with scheduling the procedure with Dr. [name] and at least get the left eye treated.  [name] said that she will contact BSW or RNCM, Jackelyn Poling if something changes.  BSW is closing at this time due to no other social work needs identified.   Ronn Melena, BSW Social Worker [phone]

## 2018-04-28 ENCOUNTER — Emergency Department (HOSPITAL_COMMUNITY): Payer: Medicare HMO

## 2018-04-28 ENCOUNTER — Other Ambulatory Visit: Payer: Self-pay

## 2018-04-28 ENCOUNTER — Encounter (HOSPITAL_COMMUNITY): Payer: Self-pay | Admitting: Emergency Medicine

## 2018-04-28 ENCOUNTER — Other Ambulatory Visit: Payer: Medicare HMO | Admitting: *Deleted

## 2018-04-28 ENCOUNTER — Observation Stay (HOSPITAL_COMMUNITY)
Admission: EM | Admit: 2018-04-28 | Discharge: 2018-04-29 | Disposition: A | Discharge status: Home / Self Care | Payer: Medicare HMO | Attending: Internal Medicine | Admitting: Internal Medicine

## 2018-04-28 DIAGNOSIS — N183 Chronic kidney disease, stage 3 unspecified: Secondary | ICD-10-CM | POA: Diagnosis present

## 2018-04-28 DIAGNOSIS — R110 Nausea: Secondary | ICD-10-CM | POA: Insufficient documentation

## 2018-04-28 DIAGNOSIS — Z8673 Personal history of transient ischemic attack (TIA), and cerebral infarction without residual deficits: Secondary | ICD-10-CM | POA: Insufficient documentation

## 2018-04-28 DIAGNOSIS — J45909 Unspecified asthma, uncomplicated: Secondary | ICD-10-CM | POA: Diagnosis not present

## 2018-04-28 DIAGNOSIS — I252 Old myocardial infarction: Secondary | ICD-10-CM | POA: Insufficient documentation

## 2018-04-28 DIAGNOSIS — I2510 Atherosclerotic heart disease of native coronary artery without angina pectoris: Secondary | ICD-10-CM | POA: Diagnosis not present

## 2018-04-28 DIAGNOSIS — F411 Generalized anxiety disorder: Secondary | ICD-10-CM | POA: Diagnosis present

## 2018-04-28 DIAGNOSIS — Z79899 Other long term (current) drug therapy: Secondary | ICD-10-CM | POA: Insufficient documentation

## 2018-04-28 DIAGNOSIS — R61 Generalized hyperhidrosis: Secondary | ICD-10-CM | POA: Diagnosis not present

## 2018-04-28 DIAGNOSIS — R0789 Other chest pain: Secondary | ICD-10-CM | POA: Diagnosis not present

## 2018-04-28 DIAGNOSIS — E1122 Type 2 diabetes mellitus with diabetic chronic kidney disease: Secondary | ICD-10-CM | POA: Insufficient documentation

## 2018-04-28 DIAGNOSIS — R079 Chest pain, unspecified: Secondary | ICD-10-CM | POA: Diagnosis not present

## 2018-04-28 DIAGNOSIS — R0600 Dyspnea, unspecified: Secondary | ICD-10-CM | POA: Insufficient documentation

## 2018-04-28 DIAGNOSIS — Z87891 Personal history of nicotine dependence: Secondary | ICD-10-CM | POA: Diagnosis not present

## 2018-04-28 DIAGNOSIS — I129 Hypertensive chronic kidney disease with stage 1 through stage 4 chronic kidney disease, or unspecified chronic kidney disease: Secondary | ICD-10-CM | POA: Insufficient documentation

## 2018-04-28 DIAGNOSIS — Z7982 Long term (current) use of aspirin: Secondary | ICD-10-CM | POA: Insufficient documentation

## 2018-04-28 DIAGNOSIS — N289 Disorder of kidney and ureter, unspecified: Secondary | ICD-10-CM | POA: Diagnosis not present

## 2018-04-28 LAB — BASIC METABOLIC PANEL
ANION GAP: 10 (ref 5–15)
BUN: 22 mg/dL — AB (ref 6–20)
CHLORIDE: 104 mmol/L (ref 98–111)
CO2: 22 mmol/L (ref 22–32)
Calcium: 8.8 mg/dL — ABNORMAL LOW (ref 8.9–10.3)
Creatinine, Ser: 1.77 mg/dL — ABNORMAL HIGH (ref 0.44–1.00)
GFR calc Af Amer: 37 mL/min — ABNORMAL LOW (ref >60)
GFR calc non Af Amer: 32 mL/min — ABNORMAL LOW (ref >60)
Glucose, Bld: 184 mg/dL — ABNORMAL HIGH (ref 70–99)
Potassium: 3.6 mmol/L (ref 3.5–5.1)
Sodium: 136 mmol/L (ref 135–145)

## 2018-04-28 LAB — CBC WITH DIFFERENTIAL/PLATELET
Basophils Absolute: 0 10*3/uL (ref 0.0–0.1)
Basophils Relative: 0 %
Eosinophils Absolute: 0 10*3/uL (ref 0.0–0.7)
Eosinophils Relative: 0 %
HEMATOCRIT: 42.6 % (ref 36.0–46.0)
Hemoglobin: 14 g/dL (ref 12.0–15.0)
Lymphocytes Relative: 12 %
Lymphs Abs: 0.9 10*3/uL (ref 0.7–4.0)
MCH: 29 pg (ref 26.0–34.0)
MCHC: 32.9 g/dL (ref 30.0–36.0)
MCV: 88.2 fL (ref 78.0–100.0)
Monocytes Absolute: 0.2 10*3/uL (ref 0.1–1.0)
Monocytes Relative: 3 %
NEUTROS ABS: 6.3 10*3/uL (ref 1.7–7.7)
Neutrophils Relative %: 85 %
Platelets: 180 10*3/uL (ref 150–400)
RBC: 4.83 MIL/uL (ref 3.87–5.11)
RDW: 13.6 % (ref 11.5–15.5)
WBC: 7.4 10*3/uL (ref 4.0–10.5)

## 2018-04-28 LAB — TROPONIN I: Troponin I: <0.03 ng/mL (ref <0.03)

## 2018-04-28 LAB — BRAIN NATRIURETIC PEPTIDE: B Natriuretic Peptide: 39 pg/mL (ref 0.0–100.0)

## 2018-04-28 MED ORDER — IOPAMIDOL (ISOVUE-370) INJECTION 76%
100.0000 mL | Freq: Once | INTRAVENOUS | Status: AC | PRN
  Administered 2018-04-28: 60 mL via INTRAVENOUS

## 2018-04-28 MED ORDER — ASPIRIN 81 MG PO CHEW
324.0000 mg | CHEWABLE_TABLET | Freq: Once | ORAL | Status: AC
  Administered 2018-04-28: 324 mg via ORAL
  Filled 2018-04-28: qty 4

## 2018-04-28 MED ORDER — MORPHINE SULFATE (PF) 4 MG/ML IV SOLN
4.0000 mg | Freq: Once | INTRAVENOUS | Status: AC
  Administered 2018-04-28: 4 mg via INTRAVENOUS
  Filled 2018-04-28: qty 1

## 2018-04-28 NOTE — Patient Outreach (Addendum)
[place] [facility]) Care Management  04/28/2018  Summer Cook 1965-11-06 [id number]   Care coordination and case closure   [facility] RN CM called to updated [name] on the contact calls to Dr Georjean Mode and [facility]urg[facility]enter [name] is willing to check with the myeyedr at in [place] to have her eye  She is now considering the [facility] after she spoke with her daughter who recently received services at 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    [name] L. Lavina Hamman, RN, BSN, CCM [facility] Telephonic Care Management Care Coordinator Direct number [phone]  Main [facility] number [phone] Fax number [phone]

## 2018-04-28 NOTE — ED Provider Notes (Signed)
[facility] EMERGENCY DEPARTMENT Provider Note   CSN: [id number] Arrival date & time: 04/28/18  2118     History   Chief Complaint Chief Complaint  Patient presents with  . Chest Pain    HPI Summer Cook is a 52 y.o. female.  The history is provided by the patient.  She has history of hypertension, steroid-induced diabetes, idiopathic thrombocytopenic purpura, coronary artery disease, pulmonary embolism, renal insufficiency and comes in complaining of chest discomfort since about 4 PM.  She describes a tight feeling in her chest in the left parasternal area without radiation.  There has been associated dyspnea, nausea with no vomiting, and diaphoresis.  Nothing makes the discomfort better, nothing made it worse.  At about 8 PM, with a tight feeling got worse and she took 2 nitroglycerin without relief.  They did burn under her tongue, but did not give her a headache.  Discomfort is rated at 10/10.  Past Medical History:  Diagnosis Date  . Allergy   . Anxiety   . Asthma   . CAD (coronary artery disease), native coronary artery    a. cath 05/20/17 - 100% distal PDA --> medical mangement (no intervention done)  . Candida esophagitis ([place])   . Chest pain at rest 04/2017  . Depression   . Essential hypertension, benign   . GERD (gastroesophageal reflux disease)   . Gout   . Hemorrhoids   . 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  Last was Prednisone '40mg'$ ; 2 wk in 10/2012.  She also was on Prednisone bridged with Cellcept briefly but stopped due to lack of insurance.   . Myocardial infarction ([place])   . Pulmonary embolism ([place]) 04/2012  . Renal insufficiency   . Serrated adenoma of colon   . Steroid-induced diabetes ([place])   . Stroke ([place])   . Thrush, oral 05/29/2011    Patient Active Problem List   Diagnosis Date Noted  . Unstable angina ([place]) 05/29/2017  . CAD  (coronary artery disease): total distal RCA EF normal (05/20/17) 05/25/2017  . UTI (urinary tract infection) 05/25/2017  . Chest pain with moderate risk for cardiac etiology 05/23/2017  . Mixed hyperlipidemia   . NSTEMI (non-ST elevated myocardial infarction) ([place]) 05/20/2017  . Diabetes mellitus type 2, uncomplicated ([place]) 47/42/5956  . History of stroke   . Chronic back pain 12/23/2015  . Hypotension 02/16/2015  . Asthma, chronic 02/16/2015  . Anxiety state 02/16/2015  . CKD (chronic kidney disease) stage 3, GFR 30-59 ml/min (HCC) 04/28/2014  . Nausea with vomiting 01/25/2013  . Abnormal LFTs 01/25/2013  . Orthostatic hypotension 01/25/2013  . Rectal bleeding 12/26/2012  . History of pulmonary embolism 05/03/2012  . Hemorrhoids 06/04/2011  . 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 Immunosuppressed status ([place]) 06/04/2011  . Depression 06/04/2011  . Obesity 06/04/2011  . Idiopathic thrombocytopenic purpura ([place]) 03/18/2011    Past Surgical History:  Procedure Laterality Date  . BONE MARROW BIOPSY    . CARPAL TUNNEL RELEASE    . CARPAL TUNNEL RELEASE Left 05/20/2016   Procedure: CARPAL TUNNEL RELEASE;  Surgeon: Carole Civil, MD;  Location: [facility] ORS;  Service: Orthopedics;  Laterality: Left;  . CARPAL TUNNEL RELEASE Right 07/16/2016   Procedure: RIGHT CARPAL TUNNEL RELEASE;  Surgeon: Carole Civil, MD;  Location: [facility] OR[facility];  Service: Orthopedics;  Laterality: Right;  . CATARACT EXTRACTION    . CHOLECYSTECTOMY  2008  . COLONOSCOPY WITH ESOPHAGOGASTRODUODENOSCOPY (EGD)  N/A 01/04/2013   Dr. Gala Romney: esophageal plaques with +KOH, hh. Gastric antrum abnormal, bx reactive gastropathy. Anal canal hemorrhoids, colonic tics, normal TI, single polyp (sessile serrated tubular adenoma). Next TCS 12/2017  . LEFT HEART CATH AND CORONARY ANGIOGRAPHY N/A 05/20/2017   Procedure: LEFT HEART CATH AND CORONARY ANGIOGRAPHY;  Surgeon: Martinique, Peter M, MD;  Location: [place] CV LAB;  Service: Cardiovascular;   Laterality: N/A;  . 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 TEE WITHOUT CARDIOVERSION N/A 01/03/2016   Procedure: TRANSESOPHAGEAL ECHOCARDIOGRAM (TEE) WITH PROPOFOL;  Surgeon: Satira Sark, MD;  Location: [facility] SU[facility];  Service: Cardiovascular;  Laterality: N/A;     OB History    Gravida      Para      Term      Preterm      AB      Living  1     SAB      TAB      Ectopic      Multiple      Live Births               Home Medications    Prior to Admission medications   Medication Sig Start Date End Date Taking? Authorizing Provider  ALPRAZolam Duanne Moron) 0.5 MG tablet Take 1 tablet by mouth 3 (three) times daily.  11/02/17   [provider]  aspirin EC 81 MG EC tablet Take 1 tablet (81 mg total) by mouth daily. 05/23/17   Eileen Stanford, PA-C  diclofenac sodium (VOLTAREN) 1 % GEL Apply 2 g topically 4 (four) times daily. 06/06/17   Debbe Odea, MD  dicyclomine (BENTYL) 20 MG tablet Take 1 tablet (20 mg total) by mouth every 6 (six) hours as needed for spasms (abdominal cramping). Patient not taking: Reported on 02/24/2018 02/12/18   Francine Graven, DO  DULoxetine (CYMBALTA) 60 MG capsule Take 60 mg by mouth daily. 02/15/18   [provider]  escitalopram (LEXAPRO) 20 MG tablet Take 20 mg by mouth daily.  05/03/17   [provider]  HYDROcodone-acetaminophen (NORCO) 10-325 MG tablet Take 1 tablet by mouth every 4 (four) hours as needed. 11/02/17   [provider]  ipratropium-albuterol (DUONEB) 0.5-2.5 (3) MG/3ML SOLN Take 3 mLs by nebulization 3 (three) times daily as needed (shorntess of breath/wheezing). Patient taking differently: Take 3 mLs by nebulization 3 (three) times daily as needed (shortness of breath/wheezing).  02/18/15   Kathie Dike, MD  isosorbide dinitrate (ISORDIL) 10 MG tablet Take 10 mg by mouth daily.     [provider]  nitroGLYCERIN (NITROSTAT) 0.4 MG SL tablet Place 1 tablet (0.4 mg total) under the tongue  every 5 (five) minutes x 3 doses as needed for chest pain. 05/25/17   Delos Haring, PA-C  ondansetron (ZOFRAN ODT) 4 MG disintegrating tablet Take 1 tablet (4 mg total) by mouth every 8 (eight) hours as needed for nausea or vomiting. Patient not taking: Reported on 02/24/2018 02/12/18   Francine Graven, DO  PROAIR RESPICLICK 595 (90 BASE) MCG/ACT AEPB Inhale 2 puffs into the lungs every 4 (four) hours as needed (Shortness of breath).  12/27/14   [provider]  prochlorperazine (COMPAZINE) 10 MG tablet Take 10 mg by mouth as needed. For nausea   12/18/11  [provider]    Family History Family History  Problem Relation Age of Onset  . Cervical cancer Mother   . Lung cancer Father   . Colon polyps Father        ???  .  Pneumonia Brother   . Colon cancer Paternal Grandmother 88  . AAA (abdominal aortic aneurysm) Paternal Grandmother   . Breast cancer Paternal Grandmother   . Colon cancer Paternal Grandfather 65  . Barrett's esophagus Paternal Aunt   . Stomach cancer Neg Hx   . Pancreatic cancer Neg Hx     Social History Social History   Tobacco Use  . Smoking status: Former Smoker    Years: 0.00    Types: Cigarettes    Last attempt to quit: 02/14/2009    Years since quitting: 9.2  . Smokeless tobacco: Never Used  Substance Use Topics  . Alcohol use: No    Alcohol/week: 0.0 oz  . Drug use: No     Allergies   Brintellix [vortioxetine]; Penicillins; Yellow jacket venom; Adhesive [tape]; and Zoloft [sertraline hcl]   Review of Systems Review of Systems  All other systems reviewed and are negative.    Physical Exam Updated Vital Signs BP (!) 124/99   Pulse 66   Temp 98.2 F (36.8 C) (Oral)   Resp 11   Wt 81.6 kg (180 lb)   LMP 08/13/2011   SpO2 100%   BMI 30.90 kg/m   Physical Exam  Nursing note and vitals reviewed.  52 year old female, resting comfortably and in no acute distress. Vital signs are significant for elevated diastolic blood  pressure. Oxygen saturation is 100%, which is normal. Head is normocephalic and atraumatic. PERRLA, EOMI. Oropharynx is clear. Neck is nontender and supple without adenopathy or JVD. Back is nontender and there is no CVA tenderness. Lungs are clear without rales, wheezes, or rhonchi. Chest is mildly tender in the left parasternal area. Heart has regular rate and rhythm without murmur. Abdomen is soft, flat, nontender without masses or hepatosplenomegaly and peristalsis is normoactive. Extremities have no cyanosis or edema, full range of motion is present. Skin is warm and dry without rash. Neurologic: Mental status is normal, cranial nerves are intact, there are no motor or sensory deficits.  ED Treatments / Results  Labs (all labs ordered are listed, but only abnormal results are displayed) Labs Reviewed  BASIC METABOLIC PANEL - Abnormal; Notable for the following components:      Result Value   Glucose, Bld 184 (*)    BUN 22 (*)    Creatinine, Ser 1.77 (*)    Calcium 8.8 (*)    GFR calc non Af Amer 32 (*)    GFR calc Af Amer 37 (*)    All other components within normal limits  TROPONIN I  BRAIN NATRIURETIC PEPTIDE  CBC WITH DIFFERENTIAL/PLATELET    EKG Normal sinus rhythm 72 bpm.  Right axis deviation.  Nonspecific intraventricular conduction delay.  Old anteroseptal myocardial infarction.  Normal intervals, normal ST and T wave.  When compared with ECG March 14, 2018, no significant changes are seen.  Radiology Dg Chest 2 View  Result Date: 04/28/2018 CLINICAL DATA:  Chest pain EXAM: CHEST - 2 VIEW COMPARISON:  02/12/2018 FINDINGS: Hyperinflation. No focal opacity or pleural effusion. Normal cardiomediastinal silhouette. No pneumothorax. Stable chronic wedging deformities of the lower thoracic spine. IMPRESSION: No active cardiopulmonary disease. Electronically Signed   By: Donavan Foil M.D.   On: 04/28/2018 22:02    Procedures Procedures  Medications Ordered in  ED Medications  aspirin chewable tablet 324 mg (324 mg Oral Given 04/28/18 2218)  morphine 4 MG/ML injection 4 mg (4 mg Intravenous Given 04/28/18 2219)  iopamidol (ISOVUE-370) 76 % injection 100 mL (60  mLs Intravenous Contrast Given 04/28/18 2317)  ondansetron (ZOFRAN) injection 4 mg (4 mg Intravenous Given 04/29/18 0008)  morphine 4 MG/ML injection 4 mg (4 mg Intravenous Given 04/29/18 0007)     Initial Impression / Assessment and Plan / ED Course  I have reviewed the triage vital signs and the nursing notes.  Pertinent labs & imaging results that were available during my care of the patient were reviewed by me and considered in my medical decision making (see chart for details).  Chest discomfort of uncertain cause.  Patient does state that since her ITP is gone into remission, she has had pulmonary embolism.  D-dimer cannot be used to effectively rule out pulmonary embolism in that setting, so she will need some imaging.  ECG shows no acute changes.  Review of old records shows she had cardiac catheterization showing 100% occlusion of the posterior descending artery, but no other coronary artery disease.  She does have known renal insufficiency with last creatinine at 1.8.  We will need to discuss with radiology whether she can tolerate IV contrast for CT angiogram today.  In the meantime, she is given aspirin and morphine.  She may need serial troponins.  Patient was able to get CT angiogram and it shows no evidence of pulmonary embolism.  Creatinine is stable.  Troponin is normal.  She continues to complain of chest discomfort in spite of morphine and is given additional dose of morphine.  She does relate that her pain got worse when she had to raise her arms above her head forward a CT scan suggesting that it might be musculoskeletal pain.  Case is discussed with Dr. Olevia Bowens, of [facility] hospitalist, who agrees to admit the patient for serial troponins.  Final Clinical Impressions(s) / ED Diagnoses    Final diagnoses:  Nonspecific chest pain  Renal insufficiency    ED Discharge Orders    None       Delora Fuel, MD 35/00/93 [phone]

## 2018-04-28 NOTE — ED Triage Notes (Signed)
Pt C/O chest pain that started around 2000. Pt states that she has taken 2 nitro with no relief. Pt also C/O weakness, diaphoresis and nausea.

## 2018-04-29 ENCOUNTER — Encounter (HOSPITAL_COMMUNITY): Payer: Self-pay | Admitting: *Deleted

## 2018-04-29 ENCOUNTER — Other Ambulatory Visit: Payer: Self-pay

## 2018-04-29 DIAGNOSIS — F411 Generalized anxiety disorder: Secondary | ICD-10-CM | POA: Diagnosis not present

## 2018-04-29 DIAGNOSIS — I25119 Atherosclerotic heart disease of native coronary artery with unspecified angina pectoris: Secondary | ICD-10-CM

## 2018-04-29 DIAGNOSIS — N183 Chronic kidney disease, stage 3 (moderate): Secondary | ICD-10-CM

## 2018-04-29 DIAGNOSIS — R079 Chest pain, unspecified: Secondary | ICD-10-CM | POA: Diagnosis not present

## 2018-04-29 LAB — TROPONIN I: Troponin I: <0.03 ng/mL (ref <0.03)

## 2018-04-29 LAB — GLUCOSE, CAPILLARY
Glucose-Capillary: 92 mg/dL (ref 70–99)
Glucose-Capillary: 103 mg/dL — ABNORMAL HIGH (ref 70–99)
Glucose-Capillary: 73 mg/dL (ref 70–99)

## 2018-04-29 LAB — MAGNESIUM: Magnesium: 2.2 mg/dL (ref 1.7–2.4)

## 2018-04-29 MED ORDER — ONDANSETRON HCL 4 MG/2ML IJ SOLN
4.0000 mg | Freq: Once | INTRAMUSCULAR | Status: AC
  Administered 2018-04-29: 4 mg via INTRAVENOUS
  Filled 2018-04-29: qty 2

## 2018-04-29 MED ORDER — ASPIRIN EC 81 MG PO TBEC
81.0000 mg | DELAYED_RELEASE_TABLET | Freq: Every day | ORAL | Status: DC
  Administered 2018-04-29: 81 mg via ORAL
  Filled 2018-04-29: qty 1

## 2018-04-29 MED ORDER — INSULIN ASPART 100 UNIT/ML ~~LOC~~ SOLN: 0.0000 [IU] | Freq: Three times a day (TID) | SUBCUTANEOUS | Status: DC

## 2018-04-29 MED ORDER — ENOXAPARIN SODIUM 40 MG/0.4ML ~~LOC~~ SOLN
40.0000 mg | SUBCUTANEOUS | Status: DC
  Administered 2018-04-29: 40 mg via SUBCUTANEOUS
  Filled 2018-04-29: qty 0.4

## 2018-04-29 MED ORDER — HYDROCODONE-ACETAMINOPHEN 10-325 MG PO TABS
1.0000 | ORAL_TABLET | ORAL | Status: DC | PRN
  Administered 2018-04-29: 1 via ORAL
  Filled 2018-04-29: qty 1

## 2018-04-29 MED ORDER — MORPHINE SULFATE (PF) 4 MG/ML IV SOLN
4.0000 mg | Freq: Once | INTRAVENOUS | Status: AC
  Administered 2018-04-29: 4 mg via INTRAVENOUS
  Filled 2018-04-29: qty 1

## 2018-04-29 MED ORDER — ACETAMINOPHEN 325 MG PO TABS: 650.0000 mg | ORAL_TABLET | ORAL | Status: DC | PRN

## 2018-04-29 MED ORDER — IPRATROPIUM-ALBUTEROL 0.5-2.5 (3) MG/3ML IN SOLN: 3.0000 mL | Freq: Three times a day (TID) | RESPIRATORY_TRACT | Status: DC | PRN

## 2018-04-29 MED ORDER — NITROGLYCERIN 0.4 MG SL SUBL: 0.4000 mg | SUBLINGUAL_TABLET | SUBLINGUAL | Status: DC | PRN

## 2018-04-29 MED ORDER — ALPRAZOLAM 0.5 MG PO TABS: 0.5000 mg | ORAL_TABLET | Freq: Three times a day (TID) | ORAL | Status: DC | PRN

## 2018-04-29 MED ORDER — ONDANSETRON HCL 4 MG/2ML IJ SOLN: 4.0000 mg | Freq: Four times a day (QID) | INTRAMUSCULAR | Status: DC | PRN

## 2018-04-29 MED ORDER — ENOXAPARIN SODIUM 40 MG/0.4ML ~~LOC~~ SOLN: 40.0000 mg | SUBCUTANEOUS | Status: DC

## 2018-04-29 MED ORDER — ISOSORBIDE MONONITRATE 20 MG PO TABS
10.0000 mg | ORAL_TABLET | Freq: Every day | ORAL | Status: DC
  Administered 2018-04-29: 10 mg via ORAL
  Filled 2018-04-29 (×5): qty 1

## 2018-04-29 NOTE — Progress Notes (Signed)
Pt discharged home today per Dr. Roderic Palau. Pt's IV site D/C'd and WDL. Pt's VSS. Pt provided with home medication list and discharge instructions. Verbalized understanding. Pt ambulated off floor in stable condition accompanied by RN.

## 2018-04-29 NOTE — H&P (Signed)
History and Physical    Summer Cook DGL:[id number] DOB: Jan 01, 1966 DOA: 04/28/2018  PCP: Redmond School, MD  Patient coming from: home  I have personally briefly reviewed patient's old medical records in [place]  Chief Complaint: Chest pain  HPI: Summer Cook is a 52 y.o. female with medical history significant of coronary artery disease, chronic kidney disease stage III, presents to the emergency room with complaints of chest discomfort.  Reports onset of symptoms occurring last night while at rest.  Discomfort is described as tightness in her chest, and substernal region.  Nonradiating.  Has associated shortness of breath.  Did not have any vomiting.  Symptoms are not exacerbated by exertion.  She does notice that discomfort is worsened by deep breath as well as lifting her arms above her head.  ED Course: EKG did not show any acute changes.  Cardiac enzymes were negative.  CT Angie of the chest was also negative for pulmonary embolus.  Review of Systems: As per HPI otherwise 10 point review of systems negative.    Past Medical History:  Diagnosis Date  . Allergy   . Anxiety   . Asthma   . CAD (coronary artery disease), native coronary artery    a. cath 05/20/17 - 100% distal PDA --> medical mangement (no intervention done)  . Candida esophagitis ([place])   . Chest pain at rest 04/2017  . Depression   . Essential hypertension, benign   . GERD (gastroesophageal reflux disease)   . Gout   . Hemorrhoids   . 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  Last was Prednisone '40mg'$ ; 2 wk in 10/2012.  She also was on Prednisone bridged with Cellcept briefly but stopped due to lack of insurance.   . Myocardial infarction ([place])   . Pulmonary embolism ([place]) 04/2012  . Renal insufficiency   . Serrated adenoma of colon   . Steroid-induced diabetes ([place])   . Stroke ([place])   .  Thrush, oral 05/29/2011    Past Surgical History:  Procedure Laterality Date  . BONE MARROW BIOPSY    . CARPAL TUNNEL RELEASE    . CARPAL TUNNEL RELEASE Left 05/20/2016   Procedure: CARPAL TUNNEL RELEASE;  Surgeon: Carole Civil, MD;  Location: [facility];  Service: Orthopedics;  Laterality: Left;  . CARPAL TUNNEL RELEASE Right 07/16/2016   Procedure: RIGHT CARPAL TUNNEL RELEASE;  Surgeon: Carole Civil, MD;  Location: [facility];  Service: Orthopedics;  Laterality: Right;  . CATARACT EXTRACTION    . CHOLECYSTECTOMY  2008  . COLONOSCOPY WITH ESOPHAGOGASTRODUODENOSCOPY (EGD) N/A 01/04/2013   Dr. Gala Romney: esophageal plaques with +KOH, hh. Gastric antrum abnormal, bx reactive gastropathy. Anal canal hemorrhoids, colonic tics, normal TI, single polyp (sessile serrated tubular adenoma). Next TCS 12/2017  . LEFT HEART CATH AND CORONARY ANGIOGRAPHY N/A 05/20/2017   Procedure: LEFT HEART CATH AND CORONARY ANGIOGRAPHY;  Surgeon: Martinique, Peter M, MD;  Location: [facility];  Service: Cardiovascular;  Laterality: N/A;  . 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 TEE WITHOUT CARDIOVERSION N/A 01/03/2016   Procedure: TRANSESOPHAGEAL ECHOCARDIOGRAM (TEE) WITH PROPOFOL;  Surgeon: Satira Sark, MD;  Location: [facility];  Service: Cardiovascular;  Laterality: N/A;     reports that she quit smoking about 9 years ago. Her smoking use included cigarettes. She quit after 0.00 years of use. She has never used smokeless tobacco. She reports that she does not drink  alcohol or use drugs.  Allergies  Allergen Reactions  . Brintellix [Vortioxetine] Itching  . Penicillins Itching  . Cymbalta [Duloxetine Hcl] Itching  . Yellow Jacket Venom Swelling  . Adhesive [Tape] Rash    Please use paper tape  . Zoloft [Sertraline Hcl] Diarrhea    Family History  Problem Relation Age of Onset  . Cervical cancer Mother   . Lung cancer Father   . Colon polyps Father        ???  . Pneumonia Brother   . Colon cancer  Paternal Grandmother 88  . AAA (abdominal aortic aneurysm) Paternal Grandmother   . Breast cancer Paternal Grandmother   . Colon cancer Paternal Grandfather 52  . Barrett's esophagus Paternal Aunt   . Stomach cancer Neg Hx   . Pancreatic cancer Neg Hx     Prior to Admission medications   Medication Sig Start Date End Date Taking? Authorizing Provider  ALPRAZolam Duanne Moron) 0.5 MG tablet Take 1 tablet by mouth 3 (three) times daily.  11/02/17  Yes [provider]  aspirin EC 81 MG EC tablet Take 1 tablet (81 mg total) by mouth daily. 05/23/17  Yes Eileen Stanford, PA-C  HYDROcodone-acetaminophen (NORCO) 10-325 MG tablet Take 1 tablet by mouth every 4 (four) hours as needed. 11/02/17  Yes [provider]  ipratropium-albuterol (DUONEB) 0.5-2.5 (3) MG/3ML SOLN Take 3 mLs by nebulization 3 (three) times daily as needed (shorntess of breath/wheezing). Patient taking differently: Take 3 mLs by nebulization 3 (three) times daily as needed (shortness of breath/wheezing).  02/18/15  Yes Kathie Dike, MD  isosorbide dinitrate (ISORDIL) 10 MG tablet Take 10 mg by mouth daily.    Yes [provider]  nitroGLYCERIN (NITROSTAT) 0.4 MG SL tablet Place 1 tablet (0.4 mg total) under the tongue every 5 (five) minutes x 3 doses as needed for chest pain. 05/25/17  Yes Carlota Raspberry, Tiffany, PA-C  PROAIR RESPICLICK 081 (90 BASE) MCG/ACT AEPB Inhale 2 puffs into the lungs every 4 (four) hours as needed (Shortness of breath).  12/27/14  Yes [provider]  diclofenac sodium (VOLTAREN) 1 % GEL Apply 2 g topically 4 (four) times daily. 06/06/17   Debbe Odea, MD  dicyclomine (BENTYL) 20 MG tablet Take 1 tablet (20 mg total) by mouth every 6 (six) hours as needed for spasms (abdominal cramping). Patient not taking: Reported on 02/24/2018 02/12/18   Francine Graven, DO  DULoxetine (CYMBALTA) 60 MG capsule Take 60 mg by mouth daily. 02/15/18   [provider]  escitalopram (LEXAPRO) 20  MG tablet Take 20 mg by mouth daily.  05/03/17   [provider]  ondansetron (ZOFRAN ODT) 4 MG disintegrating tablet Take 1 tablet (4 mg total) by mouth every 8 (eight) hours as needed for nausea or vomiting. Patient not taking: Reported on 02/24/2018 02/12/18   Francine Graven, DO  prochlorperazine (COMPAZINE) 10 MG tablet Take 10 mg by mouth as needed. For nausea   12/18/11  [provider]    Physical Exam: Vitals:   04/29/18 0140 04/29/18 0227 04/29/18 0527 04/29/18 0540  BP: (!) 155/86   110/75  Pulse: 63   (!) 54  Resp:      Temp: 98.6 F (37 C)   98.4 F (36.9 C)  TempSrc: Oral   Oral  SpO2: 100% 100%  98%  Weight: 78.7 kg (173 lb 9.6 oz)     Height:   '5\' 5"'$  (1.651 m)     Constitutional: NAD, calm, comfortable Vitals:  04/29/18 0140 04/29/18 0227 04/29/18 0527 04/29/18 0540  BP: (!) 155/86   110/75  Pulse: 63   (!) 54  Resp:      Temp: 98.6 F (37 C)   98.4 F (36.9 C)  TempSrc: Oral   Oral  SpO2: 100% 100%  98%  Weight: 78.7 kg (173 lb 9.6 oz)     Height:   '5\' 5"'$  (1.651 m)    Eyes: PERRL, lids and conjunctivae normal ENMT: Mucous membranes are moist. Posterior pharynx clear of any exudate or lesions.Normal dentition.  Neck: normal, supple, no masses, no thyromegaly Respiratory: clear to auscultation bilaterally, no wheezing, no crackles. Normal respiratory effort. No accessory muscle use.  Reproducible chest pain on palpation over sternum Cardiovascular: Regular rate and rhythm, no murmurs / rubs / gallops. No extremity edema. 2+ pedal pulses. No carotid bruits.  Abdomen: no tenderness, no masses palpated. No hepatosplenomegaly. Bowel sounds positive.  Musculoskeletal: no clubbing / cyanosis. No joint deformity upper and lower extremities. Good ROM, no contractures. Normal muscle tone.  Skin: no rashes, lesions, ulcers. No induration Neurologic: CN 2-12 grossly intact. Sensation intact, DTR normal. Strength 5/5 in all 4.  Psychiatric: Normal  judgment and insight. Alert and oriented x 3. Normal mood.    Labs on Admission: I have personally reviewed following labs and imaging studies  CBC: Recent Labs  Lab 04/28/18 2200  WBC 7.4  NEUTROABS 6.3  HGB 14.0  HCT 42.6  MCV 88.2  PLT 301   Basic Metabolic Panel: Recent Labs  Lab 04/28/18 2200 04/29/18 0109  NA 136  --   K 3.6  --   CL 104  --   CO2 22  --   GLUCOSE 184*  --   BUN 22*  --   CREATININE 1.77*  --   CALCIUM 8.8*  --   MG  --  2.2   GFR: Estimated Creatinine Clearance: 38.6 mL/min (A) (by C-G formula based on SCr of 1.77 mg/dL (H)). Liver Function Tests: No results for input(s): AST, ALT, ALKPHOS, BILITOT, PROT, ALBUMIN in the last 168 hours. No results for input(s): LIPASE, AMYLASE in the last 168 hours. No results for input(s): AMMONIA in the last 168 hours. Coagulation Profile: No results for input(s): INR, PROTIME in the last 168 hours. Cardiac Enzymes: Recent Labs  Lab 04/28/18 2200 04/29/18 0109 04/29/18 0702  TROPONINI <0.03 <0.03 <0.03   BNP (last 3 results) No results for input(s): PROBNP in the last 8760 hours. HbA1C: No results for input(s): HGBA1C in the last 72 hours. CBG: Recent Labs  Lab 04/29/18 0221 04/29/18 0800  GLUCAP 92 103*   Lipid Profile: No results for input(s): CHOL, HDL, LDLCALC, TRIG, CHOLHDL, LDLDIRECT in the last 72 hours. Thyroid Function Tests: No results for input(s): TSH, T4TOTAL, FREET4, T3FREE, THYROIDAB in the last 72 hours. Anemia Panel: No results for input(s): VITAMINB12, FOLATE, FERRITIN, TIBC, IRON, RETICCTPCT in the last 72 hours. Urine analysis:    Component Value Date/Time   COLORURINE STRAW (A) 02/12/2018 1803   APPEARANCEUR CLEAR 02/12/2018 1803   LABSPEC 1.006 02/12/2018 1803   PHURINE 5.0 02/12/2018 1803   GLUCOSEU NEGATIVE 02/12/2018 1803   HGBUR MODERATE (A) 02/12/2018 1803   BILIRUBINUR NEGATIVE 02/12/2018 1803   KETONESUR NEGATIVE 02/12/2018 1803   PROTEINUR NEGATIVE  02/12/2018 1803   UROBILINOGEN 0.2 02/17/2015 1325   NITRITE NEGATIVE 02/12/2018 1803   LEUKOCYTESUR NEGATIVE 02/12/2018 1803    Radiological Exams on Admission: Dg Chest 2 View  Result Date:  04/28/2018 CLINICAL DATA:  Chest pain EXAM: CHEST - 2 VIEW COMPARISON:  02/12/2018 FINDINGS: Hyperinflation. No focal opacity or pleural effusion. Normal cardiomediastinal silhouette. No pneumothorax. Stable chronic wedging deformities of the lower thoracic spine. IMPRESSION: No active cardiopulmonary disease. Electronically Signed   By: Donavan Foil M.D.   On: 04/28/2018 22:02   Ct Angio Chest Pe W And/or Wo Contrast  Result Date: 04/28/2018 CLINICAL DATA:  Chest pain.  PE suspected, high pretest prob EXAM: CT ANGIOGRAPHY CHEST WITH CONTRAST TECHNIQUE: Multidetector CT imaging of the chest was performed using the standard protocol during bolus administration of intravenous contrast. Multiplanar CT image reconstructions and MIPs were obtained to evaluate the vascular anatomy. CONTRAST:  76m ISOVUE-370 IOPAMIDOL (ISOVUE-370) INJECTION 76% COMPARISON:  Radiographs earlier this day.  Chest CT 05/19/2017 FINDINGS: Cardiovascular: There are no filling defects within the pulmonary arteries to suggest pulmonary embolus. No aortic dissection. Heart size upper normal. No pericardial effusion. Mediastinum/Nodes: No enlarged mediastinal or hilar lymph nodes. Esophagus decompressed. No visualized thyroid nodule. Lungs/Pleura: No focal consolidation, pulmonary edema, or pleural effusion. No pulmonary mass. Upper Abdomen: No acute findings. Postcholecystectomy. Prior splenectomy. Musculoskeletal: Unchanged chronic mild compression deformities in the lower thoracic spine. There are no acute or suspicious osseous abnormalities. Review of the MIP images confirms the above findings. IMPRESSION: 1. No pulmonary embolus. 2. No acute chest finding. Electronically Signed   By: MJeb LeveringM.D.   On: 04/28/2018 23:54    EKG:  Independently reviewed. Sinus rhythm without acute changes  Assessment/Plan Active Problems:   CKD (chronic kidney disease) stage 3, GFR 30-59 ml/min (HCC)   Anxiety state   CAD (coronary artery disease): total distal RCA EF normal (05/20/17)   Chest pain     1. Chest pain.  Very atypical.  Reproducible on palpation.  Suspect musculoskeletal origin.  Cardiac enzymes have been negative.  She had a cardiac catheterization done last year her medical management was recommended.  She is already established with cardiology.  CT Angio also negative for pulmonary embolus.  Will check 1 more troponin.  If this is negative, can likely discharge home with plans for outpatient follow-up. 2. Chronic kidney disease stage III.  Creatinine is currently at baseline.  Continue to monitor. 3. Anxiety.  Appears stable at this time.  Continue benzodiazepines.  DVT prophylaxis: Lovenox Code Status: Full code Family Communication: No family present Disposition Plan: Discharge home, likely later today Consults called:   Admission status: Observation, telemetry  JKathie DikeMD [facility] Pager [phone] If 7PM-7AM, please contact night-coverage [email or web address] Password [facility] 04/29/2018, 8:58 AM

## 2018-04-29 NOTE — Discharge Summary (Signed)
Physician Discharge Summary  Summer Cook YQM:[id number] DOB: Mar 17, 1966 DOA: 04/28/2018  PCP: Redmond School, MD  Admit date: 04/28/2018 Discharge date: 04/29/2018  Admitted From: Home Disposition: Home  Recommendations for Outpatient Follow-up:  1. Follow up with PCP in 1-2 weeks 2. Please obtain BMP/CBC in one week 3. Patient has been referred to follow-up with cardiology on 8/26  Discharge Condition: Stable CODE STATUS: Full code Diet recommendation: Heart healthy  Brief/Interim Summary: 52 year old female with a history of chronic kidney disease stage III, coronary artery disease who had a cardiac catheterization done last year or medical management was recommended.  She presents to the hospital with complaints of chest discomfort.  Patient had a CT angiogram of her chest that was negative for pulmonary embolus.  She ruled out for ACS with negative cardiac markers and no acute EKG changes.  Patient reports that her pain was worsened when she lifted her arms above her head.  Her chest pain was also reproducible when her sternum was palpated.  Her pain appears to be musculoskeletal in origin.  The remainder of her labs were unrevealing.  We have scheduled her for follow-up with cardiology in the next few weeks.  She otherwise feels stable for discharge.  Discharge Diagnoses:  Active Problems:   CKD (chronic kidney disease) stage 3, GFR 30-59 ml/min (HCC)   Anxiety state   CAD (coronary artery disease): total distal RCA EF normal (05/20/17)   Chest pain    Discharge Instructions  Discharge Instructions    Diet - low sodium heart healthy   Complete by:  As directed    Increase activity slowly   Complete by:  As directed      Allergies as of 04/29/2018      Reactions   Brintellix [vortioxetine] Itching   Penicillins Itching   Cymbalta [duloxetine Hcl] Itching   Yellow Jacket Venom Swelling   Adhesive [tape] Rash   Please use paper tape   Zoloft [sertraline Hcl] Diarrhea       Medication List    STOP taking these medications   dicyclomine 20 MG tablet Commonly known as:  BENTYL   ondansetron 4 MG disintegrating tablet Commonly known as:  ZOFRAN ODT     TAKE these medications   ALPRAZolam 0.5 MG tablet Commonly known as:  XANAX Take 1 tablet by mouth 3 (three) times daily.   aspirin 81 MG EC tablet Take 1 tablet (81 mg total) by mouth daily.   diclofenac sodium 1 % Gel Commonly known as:  VOLTAREN Apply 2 g topically 4 (four) times daily.   DULoxetine 60 MG capsule Commonly known as:  CYMBALTA Take 60 mg by mouth daily.   escitalopram 20 MG tablet Commonly known as:  LEXAPRO Take 20 mg by mouth daily.   HYDROcodone-acetaminophen 10-325 MG tablet Commonly known as:  NORCO Take 1 tablet by mouth every 4 (four) hours as needed.   ipratropium-albuterol 0.5-2.5 (3) MG/3ML Soln Commonly known as:  DUONEB Take 3 mLs by nebulization 3 (three) times daily as needed (shorntess of breath/wheezing). What changed:  reasons to take this   isosorbide dinitrate 10 MG tablet Commonly known as:  ISORDIL Take 10 mg by mouth daily.   nitroGLYCERIN 0.4 MG SL tablet Commonly known as:  NITROSTAT Place 1 tablet (0.4 mg total) under the tongue every 5 (five) minutes x 3 doses as needed for chest pain.   PROAIR RESPICLICK 528 (90 Base) MCG/ACT Aepb Generic drug:  Albuterol Sulfate Inhale 2 puffs into the  lungs every 4 (four) hours as needed (Shortness of breath).      Follow-up Information    Imogene Burn, PA-C On 05/23/2018.   Specialty:  Cardiology Why:  at 12:30 pm Contact information: [place] [address] [phone]          Allergies  Allergen Reactions  . Brintellix [Vortioxetine] Itching  . Penicillins Itching  . Cymbalta [Duloxetine Hcl] Itching  . Yellow Jacket Venom Swelling  . Adhesive [Tape] Rash    Please use paper tape  . Zoloft [Sertraline Hcl] Diarrhea     Consultations:     Procedures/Studies: Dg Chest 2 View  Result Date: 04/28/2018 CLINICAL DATA:  Chest pain EXAM: CHEST - 2 VIEW COMPARISON:  02/12/2018 FINDINGS: Hyperinflation. No focal opacity or pleural effusion. Normal cardiomediastinal silhouette. No pneumothorax. Stable chronic wedging deformities of the lower thoracic spine. IMPRESSION: No active cardiopulmonary disease. Electronically Signed   By: Donavan Foil M.D.   On: 04/28/2018 22:02   Ct Angio Chest Pe W And/or Wo Contrast  Result Date: 04/28/2018 CLINICAL DATA:  Chest pain.  PE suspected, high pretest prob EXAM: CT ANGIOGRAPHY CHEST WITH CONTRAST TECHNIQUE: Multidetector CT imaging of the chest was performed using the standard protocol during bolus administration of intravenous contrast. Multiplanar CT image reconstructions and MIPs were obtained to evaluate the vascular anatomy. CONTRAST:  60mL ISOVUE-370 IOPAMIDOL (ISOVUE-370) INJECTION 76% COMPARISON:  Radiographs earlier this day.  Chest CT 05/19/2017 FINDINGS: Cardiovascular: There are no filling defects within the pulmonary arteries to suggest pulmonary embolus. No aortic dissection. Heart size upper normal. No pericardial effusion. Mediastinum/Nodes: No enlarged mediastinal or hilar lymph nodes. Esophagus decompressed. No visualized thyroid nodule. Lungs/Pleura: No focal consolidation, pulmonary edema, or pleural effusion. No pulmonary mass. Upper Abdomen: No acute findings. Postcholecystectomy. Prior splenectomy. Musculoskeletal: Unchanged chronic mild compression deformities in the lower thoracic spine. There are no acute or suspicious osseous abnormalities. Review of the MIP images confirms the above findings. IMPRESSION: 1. No pulmonary embolus. 2. No acute chest finding. Electronically Signed   By: Jeb Levering M.D.   On: 04/28/2018 23:54       Subjective: Patient is feeling better.  No further chest pain.  Discharge Exam: Vitals:   04/29/18 0947 04/29/18  1413  BP: (!) 138/93 (!) 132/92  Pulse: (!) 48 61  Resp: 18 18  Temp: (!) 97.5 F (36.4 C) 98.7 F (37.1 C)  SpO2: 100% 99%   Vitals:   04/29/18 0527 04/29/18 0540 04/29/18 0947 04/29/18 1413  BP:  110/75 (!) 138/93 (!) 132/92  Pulse:  (!) 54 (!) 48 61  Resp:   18 18  Temp:  98.4 F (36.9 C) (!) 97.5 F (36.4 C) 98.7 F (37.1 C)  TempSrc:  Oral Oral Oral  SpO2:  98% 100% 99%  Weight:      Height: 5\' 5"  (1.651 m)       General: Pt is alert, awake, not in acute distress Cardiovascular: RRR, S1/S2 +, no rubs, no gallops Respiratory: CTA bilaterally, no wheezing, no rhonchi Abdominal: Soft, NT, ND, bowel sounds + Extremities: no edema, no cyanosis    The results of significant diagnostics from this hospitalization (including imaging, microbiology, ancillary and laboratory) are listed below for reference.     Microbiology: No results found for this or any previous visit (from the past 240 hour(s)).   Labs: BNP (last 3 results) Recent Labs    05/23/17 1212 04/28/18 2200  BNP 43.5 39.0   Basic  Metabolic Panel: Recent Labs  Lab 04/28/18 2200 04/29/18 0109  NA 136  --   K 3.6  --   CL 104  --   CO2 22  --   GLUCOSE 184*  --   BUN 22*  --   CREATININE 1.77*  --   CALCIUM 8.8*  --   MG  --  2.2   Liver Function Tests: No results for input(s): AST, ALT, ALKPHOS, BILITOT, PROT, ALBUMIN in the last 168 hours. No results for input(s): LIPASE, AMYLASE in the last 168 hours. No results for input(s): AMMONIA in the last 168 hours. CBC: Recent Labs  Lab 04/28/18 2200  WBC 7.4  NEUTROABS 6.3  HGB 14.0  HCT 42.6  MCV 88.2  PLT 180   Cardiac Enzymes: Recent Labs  Lab 04/28/18 2200 04/29/18 0109 04/29/18 0702 04/29/18 1206  TROPONINI <0.03 <0.03 <0.03 <0.03   BNP: Invalid input(s): POCBNP CBG: Recent Labs  Lab 04/29/18 0221 04/29/18 0800 04/29/18 1155  GLUCAP 92 103* 73   D-Dimer No results for input(s): DDIMER in the last 72 hours. Hgb  A1c No results for input(s): HGBA1C in the last 72 hours. Lipid Profile No results for input(s): CHOL, HDL, LDLCALC, TRIG, CHOLHDL, LDLDIRECT in the last 72 hours. Thyroid function studies No results for input(s): TSH, T4TOTAL, T3FREE, THYROIDAB in the last 72 hours.  Invalid input(s): FREET3 Anemia work up No results for input(s): VITAMINB12, FOLATE, FERRITIN, TIBC, IRON, RETICCTPCT in the last 72 hours. Urinalysis    Component Value Date/Time   COLORURINE STRAW (A) 02/12/2018 1803   APPEARANCEUR CLEAR 02/12/2018 1803   LABSPEC 1.006 02/12/2018 1803   PHURINE 5.0 02/12/2018 1803   GLUCOSEU NEGATIVE 02/12/2018 1803   HGBUR MODERATE (A) 02/12/2018 1803   BILIRUBINUR NEGATIVE 02/12/2018 1803   KETONESUR NEGATIVE 02/12/2018 1803   PROTEINUR NEGATIVE 02/12/2018 1803   UROBILINOGEN 0.2 02/17/2015 1325   NITRITE NEGATIVE 02/12/2018 1803   LEUKOCYTESUR NEGATIVE 02/12/2018 1803   Sepsis Labs Invalid input(s): PROCALCITONIN,  WBC,  LACTICIDVEN Microbiology No results found for this or any previous visit (from the past 240 hour(s)).   Time coordinating discharge: 47mins  SIGNED:   Kathie Dike, MD  [facility] 04/29/2018, 3:04 PM Pager   If 7PM-7AM, please contact night-coverage [email or web address] Password [email or web address]

## 2018-05-02 DIAGNOSIS — M1990 Unspecified osteoarthritis, unspecified site: Secondary | ICD-10-CM | POA: Diagnosis not present

## 2018-05-02 DIAGNOSIS — I2510 Atherosclerotic heart disease of native coronary artery without angina pectoris: Secondary | ICD-10-CM | POA: Diagnosis not present

## 2018-05-02 DIAGNOSIS — R61 Generalized hyperhidrosis: Secondary | ICD-10-CM | POA: Diagnosis not present

## 2018-05-02 DIAGNOSIS — R079 Chest pain, unspecified: Secondary | ICD-10-CM | POA: Diagnosis not present

## 2018-05-02 DIAGNOSIS — M6281 Muscle weakness (generalized): Secondary | ICD-10-CM | POA: Diagnosis not present

## 2018-05-02 DIAGNOSIS — Z7902 Long term (current) use of antithrombotics/antiplatelets: Secondary | ICD-10-CM | POA: Diagnosis not present

## 2018-05-02 DIAGNOSIS — Z7982 Long term (current) use of aspirin: Secondary | ICD-10-CM | POA: Diagnosis not present

## 2018-05-02 DIAGNOSIS — Z79899 Other long term (current) drug therapy: Secondary | ICD-10-CM | POA: Diagnosis not present

## 2018-05-02 DIAGNOSIS — I252 Old myocardial infarction: Secondary | ICD-10-CM | POA: Diagnosis not present

## 2018-05-02 DIAGNOSIS — R0789 Other chest pain: Secondary | ICD-10-CM | POA: Diagnosis not present

## 2018-05-18 NOTE — Progress Notes (Deleted)
Cardiology Office Note    Date:  05/18/2018   ID:  Summer Cook, DOB [birth year], MRN [id number]  PCP:  Redmond School, MD  Cardiologist: No primary care provider on file.  No chief complaint on file.   History of Present Illness:  Summer Cook is a 52 y.o. female with history of CAD status post NSTEMI with cath 04/2017 with total R PDA, otherwise normal coronary arteries normal LV function EF 55 to 60% treated medically.  Patient also has history of hypertension, CVA, history of PE, history of ITP status post splenectomy, depression.  Last seen in our office 05/2017 at which time her blood pressures were soft and metoprolol stopped.  Patient was discharged 04/29/2018 after hospitalization for chest pain.  CT angiogram was negative for pulmonary embolus.  Troponins negative EKG without acute change.  Pain was reproducible to palpation.  Past Medical History:  Diagnosis Date  . Allergy   . Anxiety   . Asthma   . CAD (coronary artery disease), native coronary artery    a. cath 05/20/17 - 100% distal PDA --> medical mangement (no intervention done)  . Candida esophagitis ([place])   . Chest pain at rest 04/2017  . Depression   . Essential hypertension, benign   . GERD (gastroesophageal reflux disease)   . Gout   . Hemorrhoids   . 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  Last was Prednisone '40mg'$ ; 2 wk in 10/2012.  She also was on Prednisone bridged with Cellcept briefly but stopped due to lack of insurance.   . Myocardial infarction ([place])   . Pulmonary embolism ([place]) 04/2012  . Renal insufficiency   . Serrated adenoma of colon   . Steroid-induced diabetes ([place])   . Stroke ([place])   . Thrush, oral 05/29/2011    Past Surgical History:  Procedure Laterality Date  . BONE MARROW BIOPSY    . CARPAL TUNNEL RELEASE    . CARPAL TUNNEL RELEASE Left 05/20/2016   Procedure: CARPAL  TUNNEL RELEASE;  Surgeon: Carole Civil, MD;  Location: [facility];  Service: Orthopedics;  Laterality: Left;  . CARPAL TUNNEL RELEASE Right 07/16/2016   Procedure: RIGHT CARPAL TUNNEL RELEASE;  Surgeon: Carole Civil, MD;  Location: [facility];  Service: Orthopedics;  Laterality: Right;  . CATARACT EXTRACTION    . CHOLECYSTECTOMY  2008  . COLONOSCOPY WITH ESOPHAGOGASTRODUODENOSCOPY (EGD) N/A 01/04/2013   Dr. Gala Romney: esophageal plaques with +KOH, hh. Gastric antrum abnormal, bx reactive gastropathy. Anal canal hemorrhoids, colonic tics, normal TI, single polyp (sessile serrated tubular adenoma). Next TCS 12/2017  . LEFT HEART CATH AND CORONARY ANGIOGRAPHY N/A 05/20/2017   Procedure: LEFT HEART CATH AND CORONARY ANGIOGRAPHY;  Surgeon: Martinique, Peter M, MD;  Location: [facility];  Service: Cardiovascular;  Laterality: N/A;  . 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 TEE WITHOUT CARDIOVERSION N/A 01/03/2016   Procedure: TRANSESOPHAGEAL ECHOCARDIOGRAM (TEE) WITH PROPOFOL;  Surgeon: Satira Sark, MD;  Location: [facility];  Service: Cardiovascular;  Laterality: N/A;    Current Medications: No outpatient medications have been marked as taking for the 05/23/18 encounter (Appointment) with Imogene Burn, PA-C.     Allergies:   Brintellix [vortioxetine]; Penicillins; Cymbalta [duloxetine hcl]; Yellow jacket venom; Adhesive [tape]; and Zoloft [sertraline hcl]   Social History   Socioeconomic History  . Marital status: Divorced    Spouse name: Not on file  . Number of children:  1  . Years of education: Not on file  . 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   Social Needs  . Financial resource strain: Not hard at all  . Food insecurity:    Worry: Sometimes true    Inability: Sometimes true  . Transportation needs:    Medical: No    Non-medical: No  Tobacco Use  . Smoking status: Former Smoker    Years:  0.00    Types: Cigarettes    Last attempt to quit: 02/14/2009    Years since quitting: 9.2  . Smokeless tobacco: Never Used  Substance and Sexual Activity  . Alcohol use: No    Alcohol/week: 0.0 standard drinks  . Drug use: No  . Sexual activity: Never  Lifestyle  . Physical activity:    Days per week: 2 days    Minutes per session: 30 min  . Stress: Rather much  Relationships  . Social connections:    Talks on phone: More than three times a week    Gets together: More than three times a week    Attends religious service: Never    Active member of club or organization: No    Attends meetings of clubs or organizations: Never    Relationship status: Divorced  Other Topics Concern  . Not on file  Social History Narrative   Lives with her daughter and aunt in a one story home.  Has 1 daughter.  On disability.  Did work as a Optometrist and bus Geophysicist/field seismologist.  Education: some college.      Family History:  The patient's ***family history includes AAA (abdominal aortic aneurysm) in her paternal grandmother; Barrett's esophagus in her paternal aunt; Breast cancer in her paternal grandmother; Cervical cancer in her mother; Colon cancer (age of onset: 37) in her paternal grandfather; Colon cancer (age of onset: 14) in her paternal grandmother; Colon polyps in her father; Lung cancer in her father; Pneumonia in her brother.   ROS:   Please see the history of present illness.    ROS All other systems reviewed and are negative.   PHYSICAL EXAM:   VS:  LMP 08/13/2011   Physical Exam  GEN: Well nourished, well developed, in no acute distress  HEENT: normal  Neck: no JVD, carotid bruits, or masses Cardiac:RRR; no murmurs, rubs, or gallops  Respiratory:  clear to auscultation bilaterally, normal work of breathing GI: soft, nontender, nondistended, + BS Ext: without cyanosis, clubbing, or edema, Good distal pulses bilaterally MS: no deformity or atrophy  Skin: warm and dry, no  rash Neuro:  Alert and Oriented x 3, Strength and sensation are intact Psych: euthymic mood, full affect  Wt Readings from Last 3 Encounters:  04/29/18 173 lb 9.6 oz (78.7 kg)  03/14/18 180 lb (81.6 kg)  02/24/18 180 lb 12.8 oz (82 kg)      Studies/Labs Reviewed:   EKG:  EKG is*** ordered today.  The ekg reviewed from 04/29/2018 normal sinus rhythm with poor R wave progression anteriorly, no acute change Recent Labs: 06/06/2017: TSH 2.861 03/14/2018: ALT 19 04/28/2018: B Natriuretic Peptide 39.0; BUN 22; Creatinine, Ser 1.77; Hemoglobin 14.0; Platelets 180; Potassium 3.6; Sodium 136 04/29/2018: Magnesium 2.2   Lipid Panel    Component Value Date/Time   CHOL 229 (H) 05/19/2017 1912   TRIG 114 05/19/2017 1912   HDL 57 05/19/2017 1912   CHOLHDL 4.0 05/19/2017  1912   VLDL 23 05/19/2017 1912   LDLCALC 149 (H) 05/19/2017 1912    Additional studies/ records that were reviewed today include:  08/23/2018cardiac catheterization: Conclusion       RPDA lesion, 100 %stenosed.  The left ventricular systolic function is normal.  LV end diastolic pressure is normal.  The left ventricular ejection fraction is 55-65% by visual estimate.   1. 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 Otherwise normal coronary arteries. 3. Normal LV function with very small focal area of distal inferior hypokinesis.   Plan: medical therapy.        ASSESSMENT:    1. Coronary artery disease involving native coronary artery of native heart without angina pectoris   2. Other specified hypotension   3. CKD (chronic kidney disease) stage 3, GFR 30-59 ml/min (HCC)   4. Mixed hyperlipidemia   5. History of stroke      PLAN:  In order of problems listed above:  CAD status post NSTEMI and cath 04/2017 with total R PDA otherwise normal coronary arteries, normal LVEF 55 to 65%-discharged from the hospital with reproducible chest pain 04/29/2018 CTA negative for PE, troponins negative, EKG  unchanged  History of hypotension metoprolol stopped last year  CKD stage III creatinine 1.77 04/28/2018  Hyperlipidemia  History of CVA    Medication Adjustments/Labs and Tests Ordered: Current medicines are reviewed at length with the patient today.  Concerns regarding medicines are outlined above.  Medication changes, Labs and Tests ordered today are listed in the Patient Instructions below. There are no Patient Instructions on file for this visit.   Sumner Boast, PA-C  05/18/2018 3:27 PM    [facility] [address], [place], [place]  [address] Phone: [phone]; Fax: [phone]

## 2018-05-23 ENCOUNTER — Ambulatory Visit: Payer: Medicare HMO | Admitting: Physician Assistant

## 2018-05-24 ENCOUNTER — Encounter: Payer: Self-pay | Admitting: Physician Assistant

## 2018-05-27 ENCOUNTER — Ambulatory Visit: Payer: Medicare HMO | Admitting: Gastroenterology

## 2018-06-03 DIAGNOSIS — E119 Type 2 diabetes mellitus without complications: Secondary | ICD-10-CM | POA: Diagnosis not present

## 2018-06-03 DIAGNOSIS — F329 Major depressive disorder, single episode, unspecified: Secondary | ICD-10-CM | POA: Diagnosis not present

## 2018-06-03 DIAGNOSIS — E6609 Other obesity due to excess calories: Secondary | ICD-10-CM | POA: Diagnosis not present

## 2018-06-03 DIAGNOSIS — G894 Chronic pain syndrome: Secondary | ICD-10-CM | POA: Diagnosis not present

## 2018-06-03 DIAGNOSIS — Z6830 Body mass index (BMI) 30.0-30.9, adult: Secondary | ICD-10-CM | POA: Diagnosis not present

## 2018-06-20 DIAGNOSIS — H6991 Unspecified Eustachian tube disorder, right ear: Secondary | ICD-10-CM | POA: Diagnosis not present

## 2018-06-20 DIAGNOSIS — H6591 Unspecified nonsuppurative otitis media, right ear: Secondary | ICD-10-CM | POA: Diagnosis not present

## 2018-06-20 DIAGNOSIS — E663 Overweight: Secondary | ICD-10-CM | POA: Diagnosis not present

## 2018-06-20 DIAGNOSIS — Z1389 Encounter for screening for other disorder: Secondary | ICD-10-CM | POA: Diagnosis not present

## 2018-06-20 DIAGNOSIS — Z6829 Body mass index (BMI) 29.0-29.9, adult: Secondary | ICD-10-CM | POA: Diagnosis not present

## 2018-06-22 IMAGING — DX DG KNEE COMPLETE 4+V*L*
4 series · 4 of 4 positions shown · non-contrast
Comparison: None.

CLINICAL DATA: LEFT knee pain after fall last night. tripping on
baby gate

EXAM:
LEFT KNEE - COMPLETE 4+ VIEW

[knee ap]
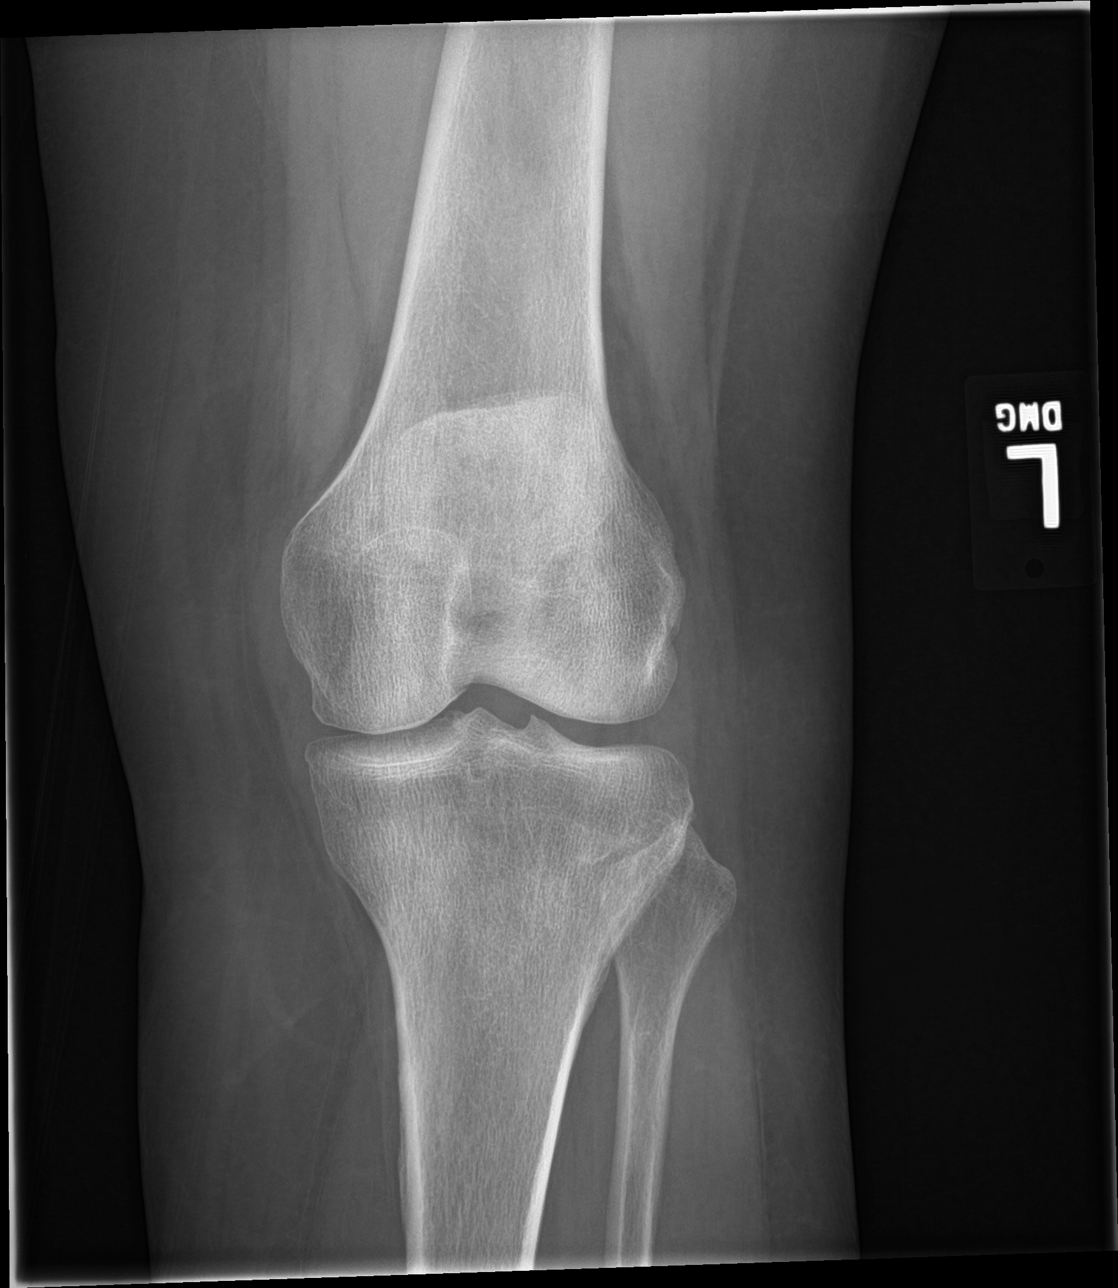

[knee obl (1 of 2)]
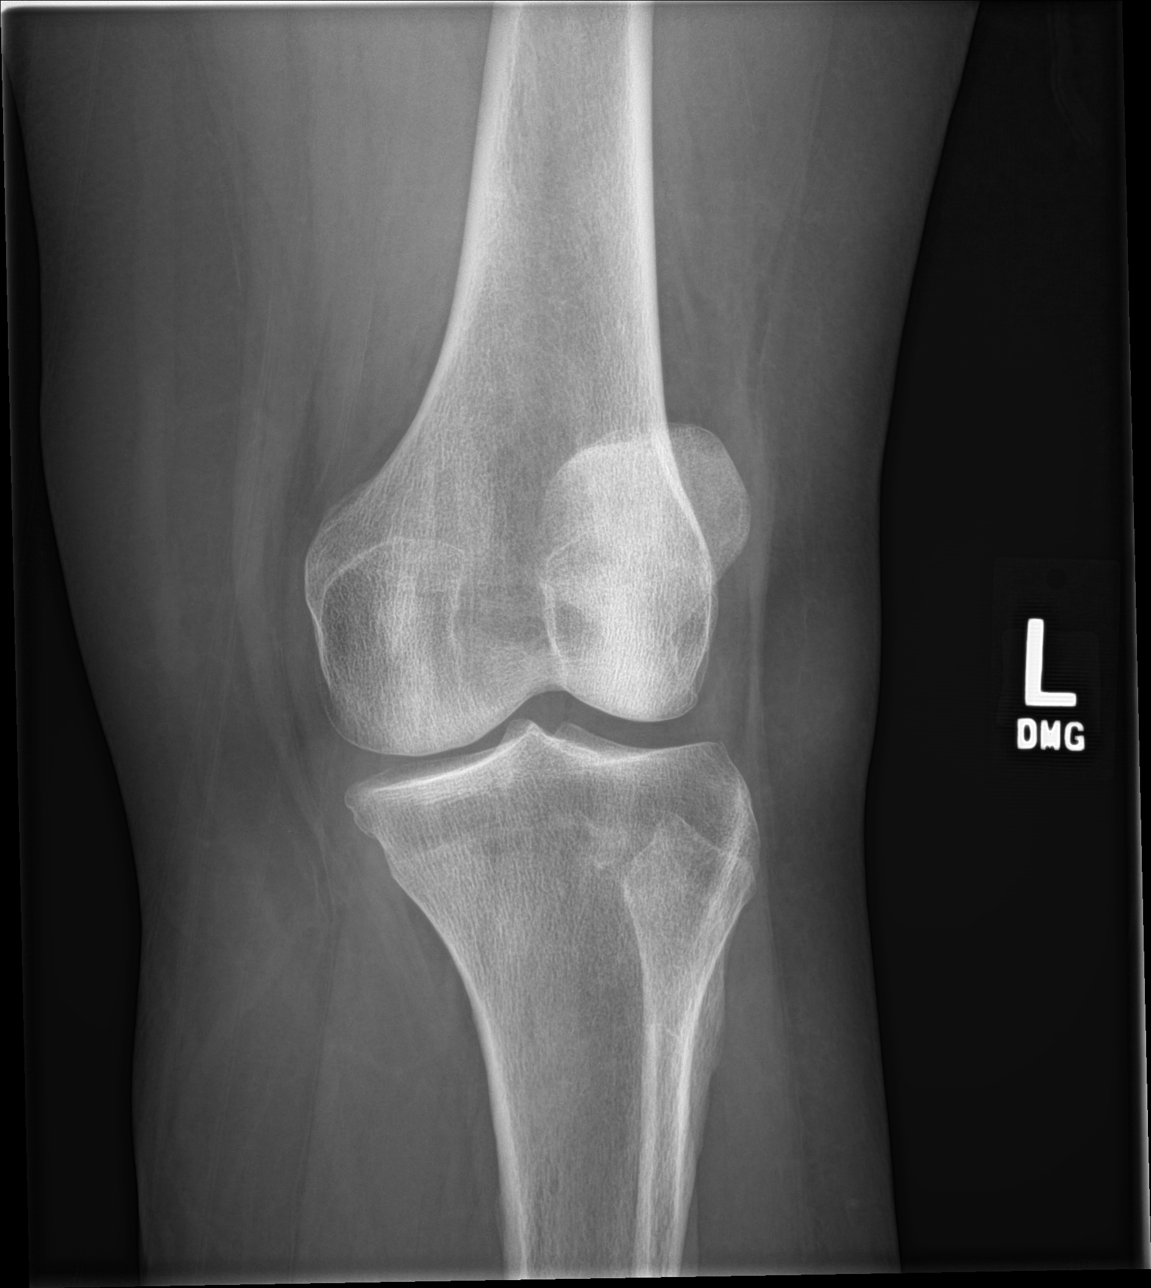

[knee obl (2 of 2)]
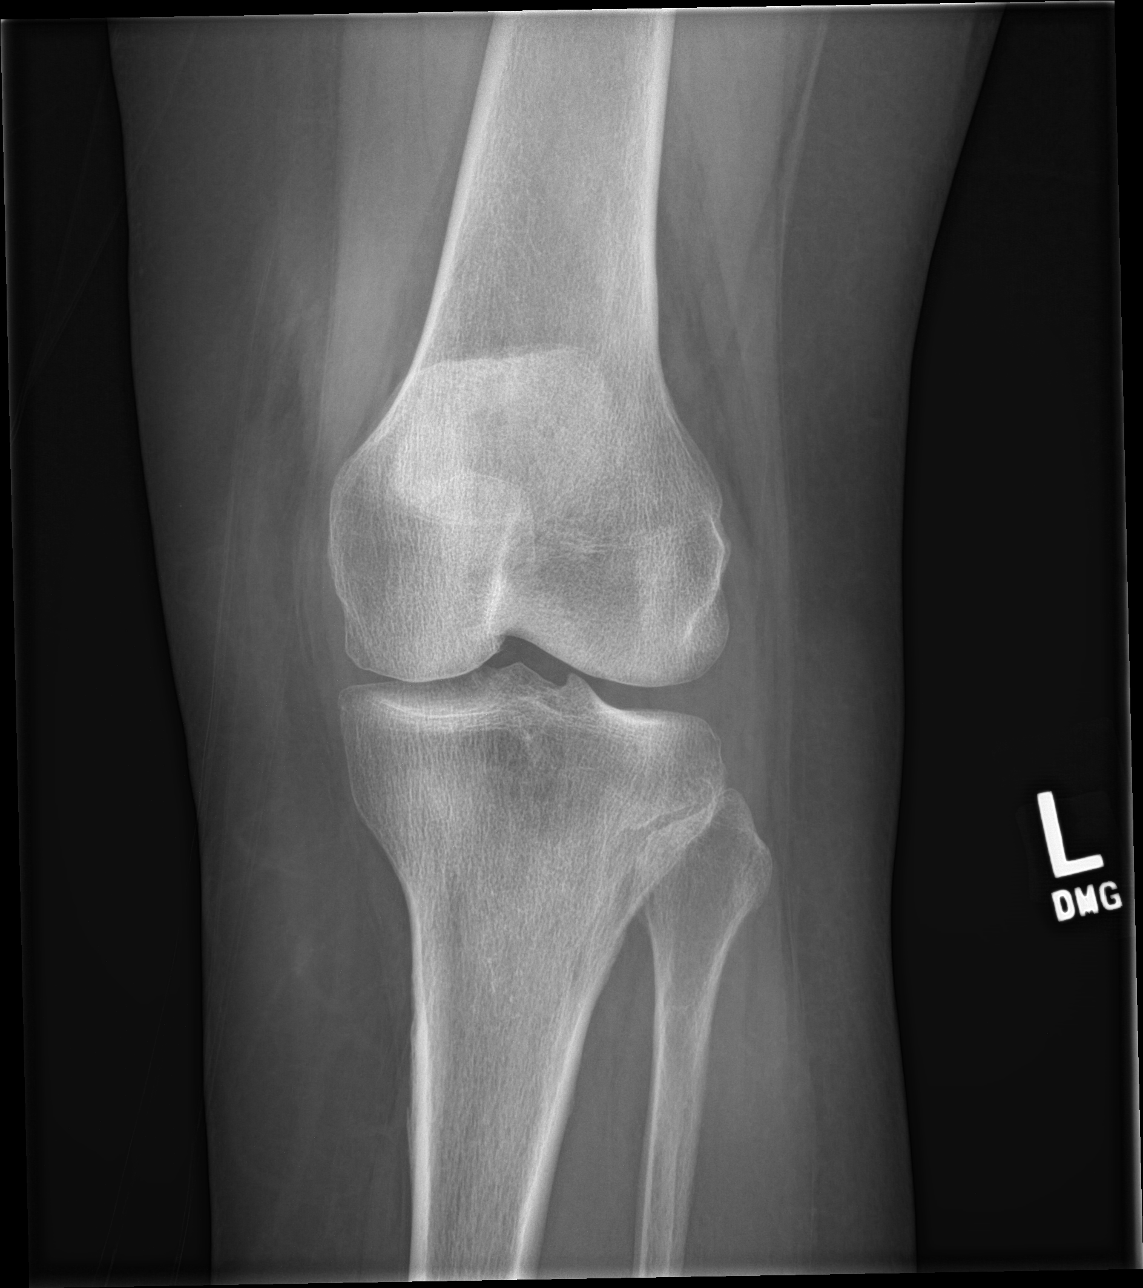

[knee lat]
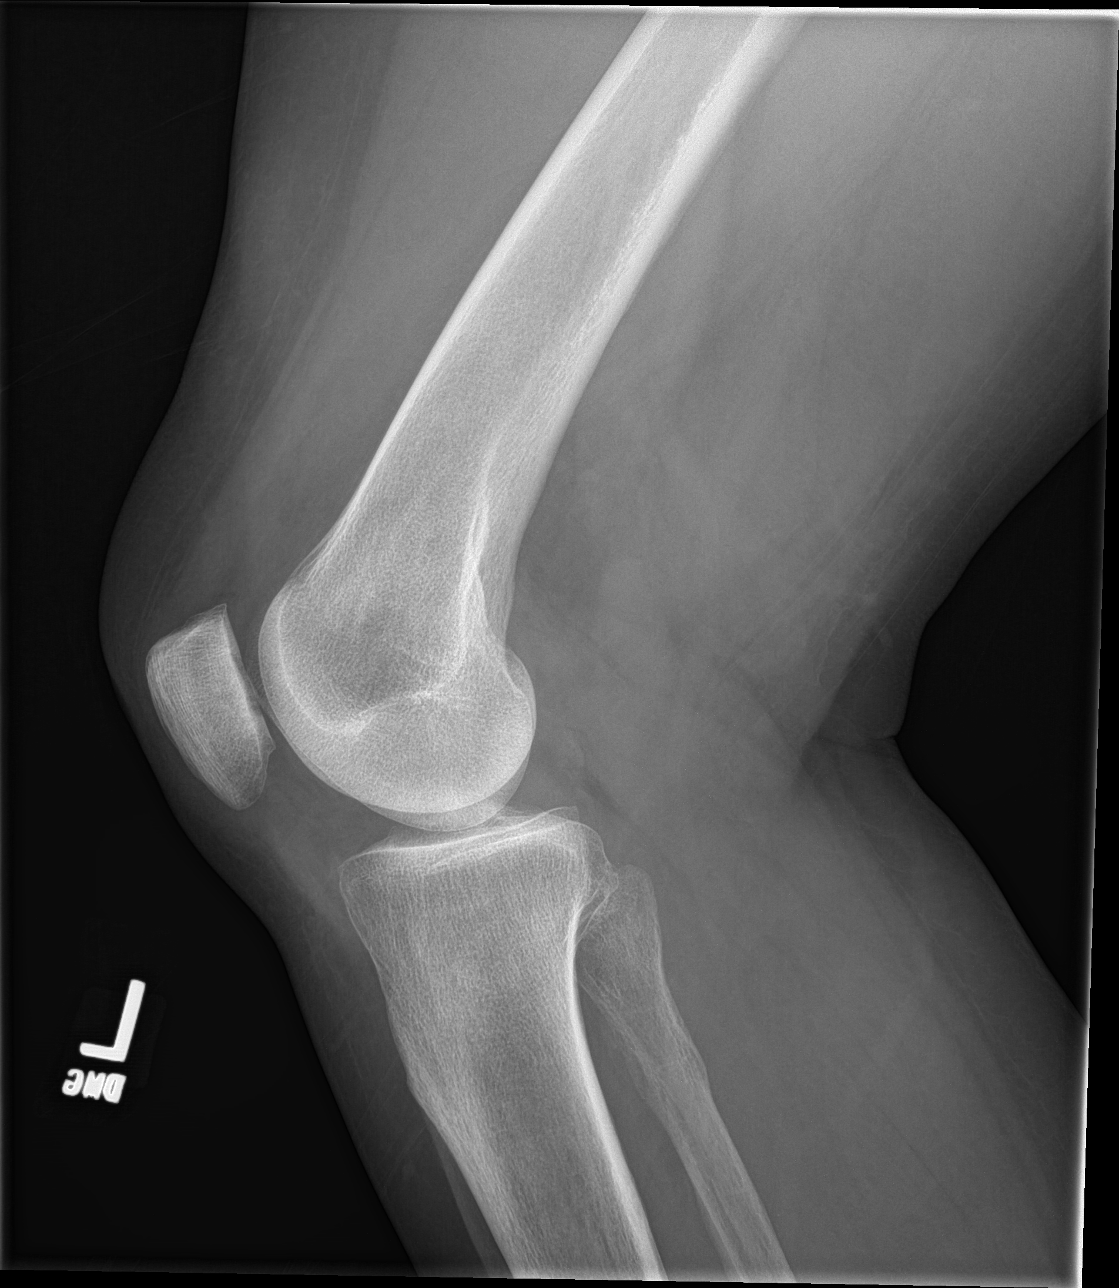

[4 of 4 positions shown; findings below may reference images not displayed]

FINDINGS: No fracture of the proximal tibia or distal femur. Patella is
normal. No joint effusion.
IMPRESSION: No fracture or dislocation.

## 2018-06-22 IMAGING — DX DG RIBS W/ CHEST 3+V*R*
3 series · 3 of 3 positions shown · non-contrast
Comparison: Chest x-ray 11/05/2016

CLINICAL DATA: Fell last night.  Pain under right breast.

EXAM:
RIGHT RIBS AND CHEST - 3+ VIEW

[chest pa]
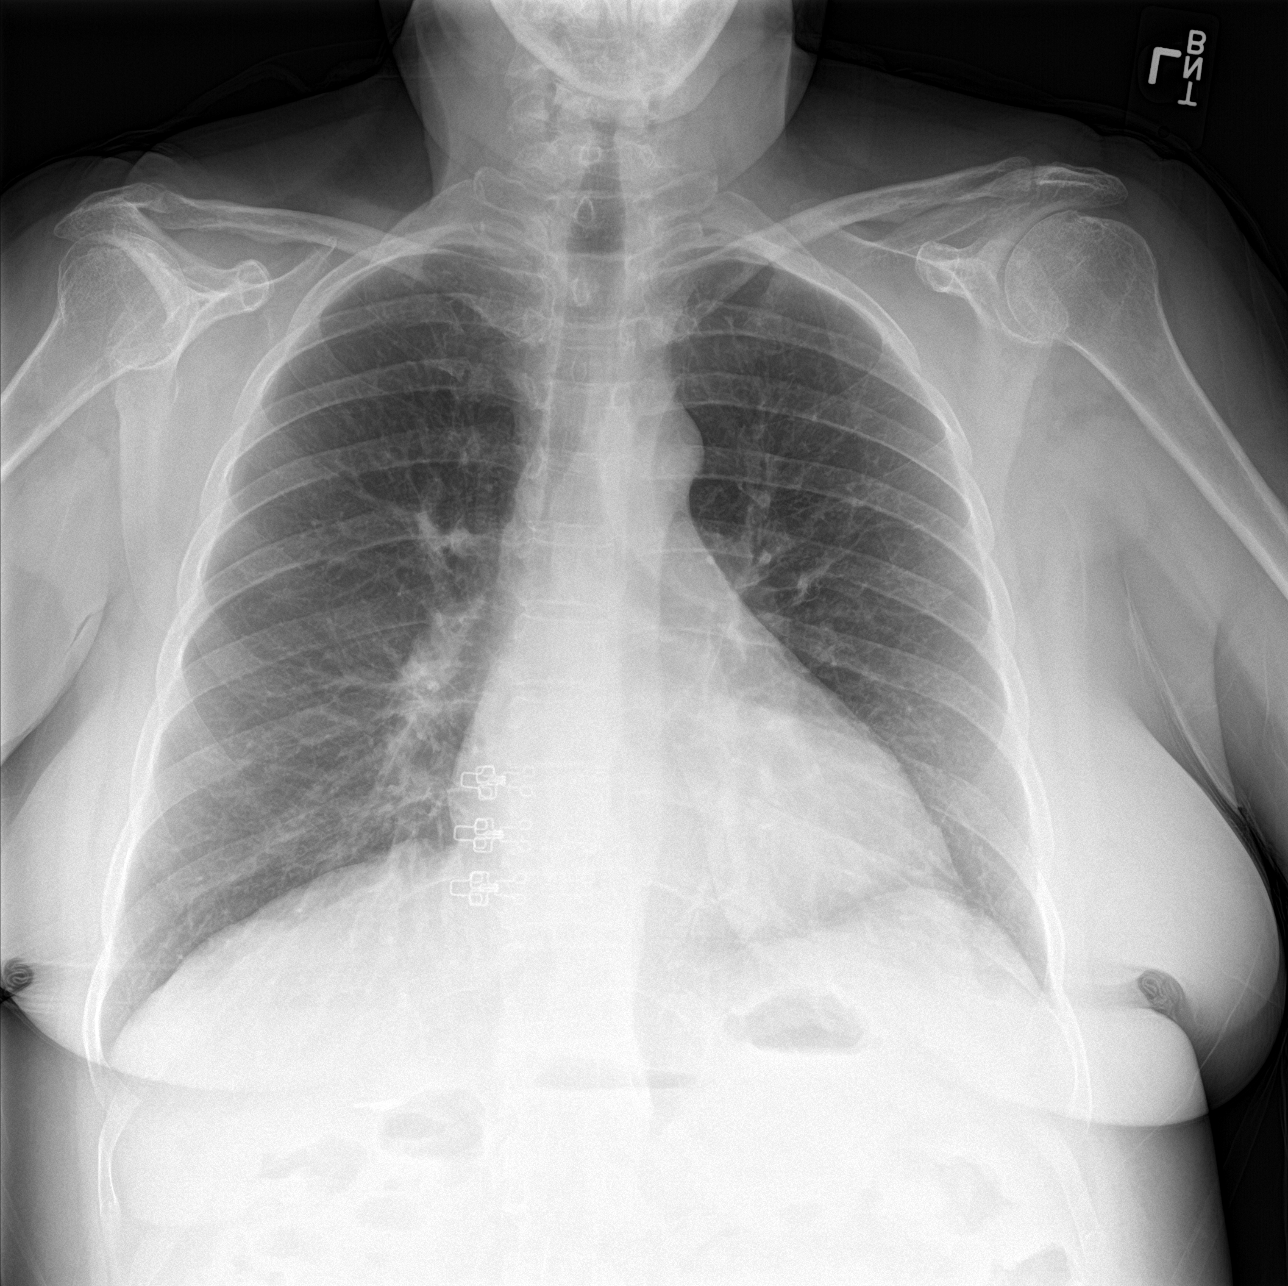

[rib pa]
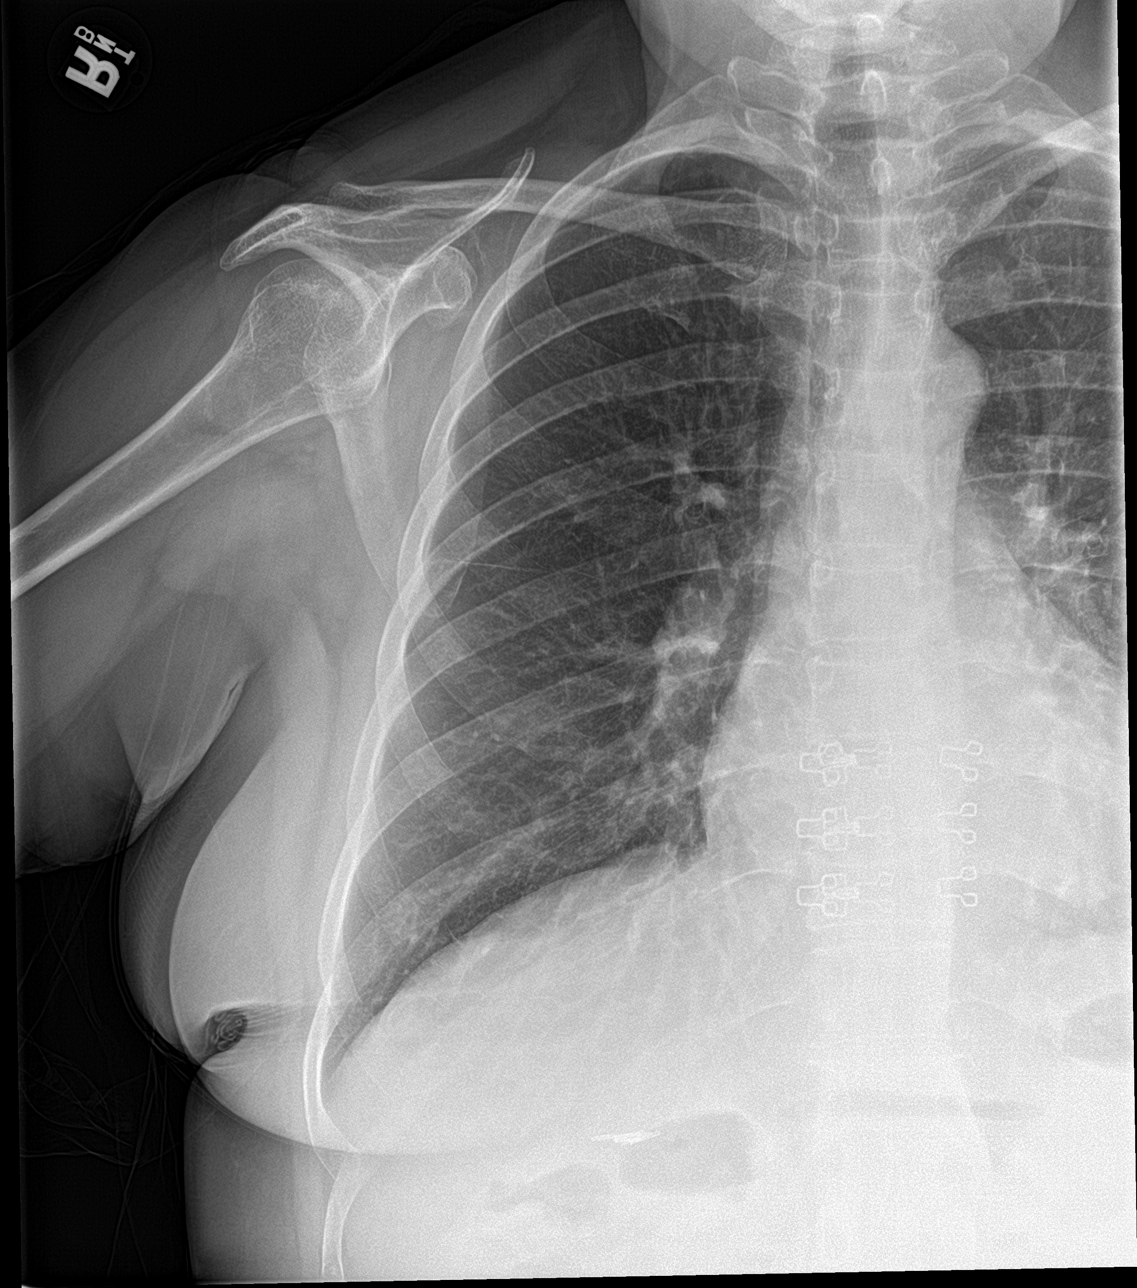

[rib pa obl]
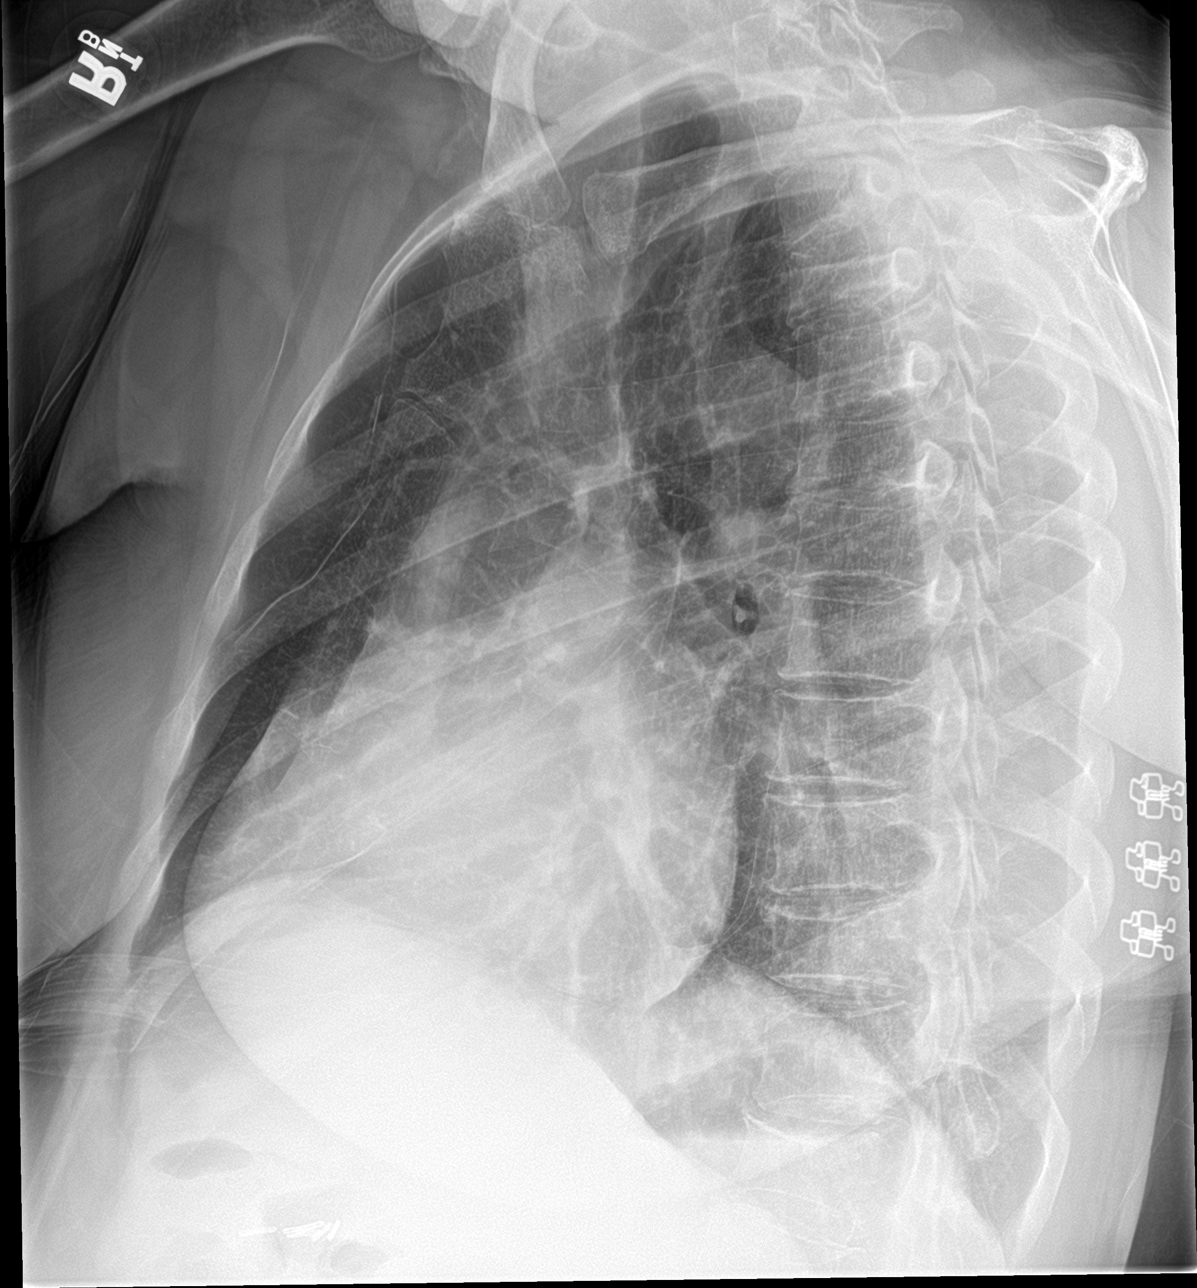

[3 of 3 positions shown; findings below may reference images not displayed]

FINDINGS: The lungs are clear wiithout focal pneumonia, edema, pneumothorax or
pleural effusion. The cardiopericardial silhouette is within normal
limits for size. Oblique views of the right ribs show no evidence
for a displaced acute right-sided rib fracture.
IMPRESSION: Negative.

## 2018-06-29 ENCOUNTER — Emergency Department (HOSPITAL_COMMUNITY)
Admission: EM | Admit: 2018-06-29 | Discharge: 2018-06-30 | Disposition: A | Discharge status: Home / Self Care | Payer: Medicare HMO | Attending: Emergency Medicine | Admitting: Emergency Medicine

## 2018-06-29 ENCOUNTER — Other Ambulatory Visit: Payer: Self-pay

## 2018-06-29 ENCOUNTER — Encounter (HOSPITAL_COMMUNITY): Payer: Self-pay | Admitting: Emergency Medicine

## 2018-06-29 ENCOUNTER — Emergency Department (HOSPITAL_COMMUNITY): Payer: Medicare HMO

## 2018-06-29 DIAGNOSIS — N183 Chronic kidney disease, stage 3 (moderate): Secondary | ICD-10-CM | POA: Diagnosis not present

## 2018-06-29 DIAGNOSIS — Z87891 Personal history of nicotine dependence: Secondary | ICD-10-CM | POA: Diagnosis not present

## 2018-06-29 DIAGNOSIS — Z7982 Long term (current) use of aspirin: Secondary | ICD-10-CM | POA: Insufficient documentation

## 2018-06-29 DIAGNOSIS — R079 Chest pain, unspecified: Secondary | ICD-10-CM | POA: Diagnosis not present

## 2018-06-29 DIAGNOSIS — I2510 Atherosclerotic heart disease of native coronary artery without angina pectoris: Secondary | ICD-10-CM | POA: Insufficient documentation

## 2018-06-29 DIAGNOSIS — I129 Hypertensive chronic kidney disease with stage 1 through stage 4 chronic kidney disease, or unspecified chronic kidney disease: Secondary | ICD-10-CM | POA: Diagnosis not present

## 2018-06-29 DIAGNOSIS — Z79899 Other long term (current) drug therapy: Secondary | ICD-10-CM | POA: Insufficient documentation

## 2018-06-29 DIAGNOSIS — J45909 Unspecified asthma, uncomplicated: Secondary | ICD-10-CM | POA: Insufficient documentation

## 2018-06-29 DIAGNOSIS — R072 Precordial pain: Secondary | ICD-10-CM | POA: Diagnosis not present

## 2018-06-29 LAB — I-STAT TROPONIN, ED: Troponin i, poc: 0 ng/mL (ref 0.00–0.08)

## 2018-06-29 LAB — BASIC METABOLIC PANEL
Anion gap: 7 (ref 5–15)
BUN: 28 mg/dL — AB (ref 6–20)
CO2: 23 mmol/L (ref 22–32)
Calcium: 9 mg/dL (ref 8.9–10.3)
Chloride: 111 mmol/L (ref 98–111)
Creatinine, Ser: 1.57 mg/dL — ABNORMAL HIGH (ref 0.44–1.00)
GFR, EST AFRICAN AMERICAN: 43 mL/min — AB (ref >60)
GFR, EST NON AFRICAN AMERICAN: 37 mL/min — AB (ref >60)
Glucose, Bld: 105 mg/dL — ABNORMAL HIGH (ref 70–99)
POTASSIUM: 4.4 mmol/L (ref 3.5–5.1)
SODIUM: 141 mmol/L (ref 135–145)

## 2018-06-29 LAB — CBC
HCT: 38.2 % (ref 36.0–46.0)
Hemoglobin: 12.1 g/dL (ref 12.0–15.0)
MCH: 28.4 pg (ref 26.0–34.0)
MCHC: 31.7 g/dL (ref 30.0–36.0)
MCV: 89.7 fL (ref 78.0–100.0)
PLATELETS: 205 10*3/uL (ref 150–400)
RBC: 4.26 MIL/uL (ref 3.87–5.11)
RDW: 13.8 % (ref 11.5–15.5)
WBC: 8 10*3/uL (ref 4.0–10.5)

## 2018-06-29 NOTE — ED Provider Notes (Signed)
[facility] EMERGENCY DEPARTMENT Provider Note   CSN: [id number] Arrival date & time: 06/29/18  2216     History   Chief Complaint Chief Complaint  Patient presents with  . Chest Pain  . Leg Pain    HPI Summer Cook is a 52 y.o. female.  Patient is a 52 year old female with past medical history of coronary artery disease.  In September 2018, she was admitted with chest discomfort and mildly elevated troponin.  She was found to have a distal, occluded RPDA lesion, however otherwise normal coronaries.  She presents today with complaints of chest discomfort.  This started earlier this evening while she was arguing with her daughter.  She reports her pain is "sharp and dull" and her "legs feel like lead".  She denies shortness of breath, nausea, or diaphoresis.  She reports taking several doses of her Xanax, however this did not help.  She denies to me she is experiencing any recent exertional symptoms.  The history is provided by the patient.  Chest Pain   This is a recurrent problem. Episode onset: 7 PM this evening. The problem occurs constantly. The problem has not changed since onset.The pain is associated with an emotional upset. The pain is present in the substernal region. The pain is moderate. The quality of the pain is described as sharp and dull. Associated symptoms include leg pain. Pertinent negatives include no diaphoresis, no nausea and no shortness of breath. Treatments tried: Xanax. The treatment provided no relief.  Leg Pain      Past Medical History:  Diagnosis Date  . Allergy   . Anxiety   . Asthma   . CAD (coronary artery disease), native coronary artery    a. cath 05/20/17 - 100% distal PDA --> medical mangement (no intervention done)  . Candida esophagitis ([place])   . Chest pain at rest 04/2017  . Depression   . Essential hypertension, benign   . GERD (gastroesophageal reflux disease)   . Gout   . Hemorrhoids   . ITP (idiopathic thrombocytopenic purpura)  08/2010   Treated with Prednisone, IVIg (transient response), Rituxan (no response), Cytoxan (no response).  Last was Prednisone '40mg'$ ; 2 wk in 10/2012.  She also was on Prednisone bridged with Cellcept briefly but stopped due to lack of insurance.   . Myocardial infarction ([place])   . Pulmonary embolism ([place]) 04/2012  . Renal insufficiency   . Serrated adenoma of colon   . Steroid-induced diabetes ([place])   . Stroke ([place])   . Thrush, oral 05/29/2011    Patient Active Problem List   Diagnosis Date Noted  . Chest pain 04/29/2018  . Unstable angina ([place]) 05/29/2017  . CAD (coronary artery disease): total distal RCA EF normal (05/20/17) 05/25/2017  . UTI (urinary tract infection) 05/25/2017  . Chest pain with moderate risk for cardiac etiology 05/23/2017  . Mixed hyperlipidemia   . NSTEMI (non-ST elevated myocardial infarction) ([place]) 05/20/2017  . Diabetes mellitus type 2, uncomplicated ([place]) 03/23/9484  . History of stroke   . Chronic back pain 12/23/2015  . Hypotension 02/16/2015  . Asthma, chronic 02/16/2015  . Anxiety state 02/16/2015  . CKD (chronic kidney disease) stage 3, GFR 30-59 ml/min (HCC) 04/28/2014  . Nausea with vomiting 01/25/2013  . Abnormal LFTs 01/25/2013  . Orthostatic hypotension 01/25/2013  . Rectal bleeding 12/26/2012  . History of pulmonary embolism 05/03/2012  . Hemorrhoids 06/04/2011  . 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 Immunosuppressed status ([place]) 06/04/2011  . Depression 06/04/2011  . Obesity  06/04/2011  . Idiopathic thrombocytopenic purpura ([place]) 03/18/2011    Past Surgical History:  Procedure Laterality Date  . BONE MARROW BIOPSY    . CARDIAC CATHETERIZATION    . CARPAL TUNNEL RELEASE    . CARPAL TUNNEL RELEASE Left 05/20/2016   Procedure: CARPAL TUNNEL RELEASE;  Surgeon: Carole Civil, MD;  Location: [facility];  Service: Orthopedics;  Laterality: Left;  . CARPAL TUNNEL RELEASE Right 07/16/2016   Procedure: RIGHT CARPAL TUNNEL RELEASE;  Surgeon:  Carole Civil, MD;  Location: [facility];  Service: Orthopedics;  Laterality: Right;  . CATARACT EXTRACTION    . CHOLECYSTECTOMY  2008  . COLONOSCOPY WITH ESOPHAGOGASTRODUODENOSCOPY (EGD) N/A 01/04/2013   Dr. Gala Romney: esophageal plaques with +KOH, hh. Gastric antrum abnormal, bx reactive gastropathy. Anal canal hemorrhoids, colonic tics, normal TI, single polyp (sessile serrated tubular adenoma). Next TCS 12/2017  . LEFT HEART CATH AND CORONARY ANGIOGRAPHY N/A 05/20/2017   Procedure: LEFT HEART CATH AND CORONARY ANGIOGRAPHY;  Surgeon: Martinique, Peter M, MD;  Location: [place] [facility];  Service: Cardiovascular;  Laterality: N/A;  . 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 TEE WITHOUT CARDIOVERSION N/A 01/03/2016   Procedure: TRANSESOPHAGEAL ECHOCARDIOGRAM (TEE) WITH PROPOFOL;  Surgeon: Satira Sark, MD;  [facility]: [facility];  Service: Cardiovascular;  Laterality: N/A;     OB History    Gravida      Para      Term      Preterm      AB      Living  1     SAB      TAB      Ectopic      Multiple      Live Births               Home Medications    Prior to Admission medications   Medication Sig Start Date End Date Taking? Authorizing Provider  ALPRAZolam Duanne Moron) 0.5 MG tablet Take 1 tablet by mouth 3 (three) times daily.  11/02/17  Yes [provider]  aspirin EC 81 MG EC tablet Take 1 tablet (81 mg total) by mouth daily. 05/23/17   Eileen Stanford, PA-C  azithromycin (ZITHROMAX) 250 MG tablet Take 250-500 mg by mouth See admin instructions. Starting on 06/20/2018 take '500mg'$  on day 1, then take '250mg'$  on days 2 through 5 06/20/18   [provider]  diclofenac sodium (VOLTAREN) 1 % GEL Apply 2 g topically 4 (four) times daily. 06/06/17   Debbe Odea, MD  DULoxetine (CYMBALTA) 60 MG capsule Take 60 mg by mouth daily. 02/15/18   [provider]  escitalopram (LEXAPRO) 20 MG tablet Take 20 mg by mouth daily.  05/03/17   [provider]    HYDROcodone-acetaminophen (NORCO) 10-325 MG tablet Take 1 tablet by mouth every 4 (four) hours as needed. 11/02/17   [provider]  ipratropium-albuterol (DUONEB) 0.5-2.5 (3) MG/3ML SOLN Take 3 mLs by nebulization 3 (three) times daily as needed (shorntess of breath/wheezing). Patient taking differently: Take 3 mLs by nebulization 3 (three) times daily as needed (shortness of breath/wheezing).  02/18/15   Kathie Dike, MD  isosorbide dinitrate (ISORDIL) 10 MG tablet Take 10 mg by mouth daily.     [provider]  nitroGLYCERIN (NITROSTAT) 0.4 MG SL tablet Place 1 tablet (0.4 mg total) under the tongue every 5 (five) minutes x 3 doses as needed for chest pain. 05/25/17   Delos Haring, PA-C  Oxycodone HCl 10 MG TABS Take 10  mg by mouth every 4 (four) hours as needed (for pain).  06/03/18   [provider]  PROAIR RESPICLICK 683 (90 BASE) MCG/ACT AEPB Inhale 2 puffs into the lungs every 4 (four) hours as needed (Shortness of breath).  12/27/14   [provider]  prochlorperazine (COMPAZINE) 10 MG tablet Take 10 mg by mouth as needed. For nausea   12/18/11  [provider]    Family History Family History  Problem Relation Age of Onset  . Cervical cancer Mother   . Lung cancer Father   . Colon polyps Father        ???  . Pneumonia Brother   . Colon cancer Paternal Grandmother 88  . AAA (abdominal aortic aneurysm) Paternal Grandmother   . Breast cancer Paternal Grandmother   . Colon cancer Paternal Grandfather 64  . Barrett's esophagus Paternal Aunt   . Stomach cancer Neg Hx   . Pancreatic cancer Neg Hx     Social History Social History   Tobacco Use  . Smoking status: Former Smoker    Years: 0.00    Types: Cigarettes    Last attempt to quit: 02/14/2009    Years since quitting: 9.3  . Smokeless tobacco: Never Used  Substance Use Topics  . Alcohol use: No    Alcohol/week: 0.0 standard drinks  . Drug use: No     Allergies    Brintellix [vortioxetine]; Penicillins; Cymbalta [duloxetine hcl]; Yellow jacket venom; Adhesive [tape]; and Zoloft [sertraline hcl]   Review of Systems Review of Systems  Constitutional: Negative for diaphoresis.  Respiratory: Negative for shortness of breath.   Cardiovascular: Positive for chest pain.  Gastrointestinal: Negative for nausea.  All other systems reviewed and are negative.    Physical Exam Updated Vital Signs BP (!) 139/109 (BP Location: Right Arm)   Pulse 92   Temp 98.4 F (36.9 C) (Oral)   Resp 14   Ht '5\' 5"'$  (1.651 m)   Wt 78.5 kg   LMP 08/13/2011   SpO2 97%   BMI 28.79 kg/m   Physical Exam  Constitutional: She is oriented to person, place, and time. She appears well-developed and well-nourished. No distress.  HENT:  Head: Normocephalic and atraumatic.  Neck: Normal range of motion. Neck supple.  Cardiovascular: Normal rate and regular rhythm. Exam reveals no gallop and no friction rub.  No murmur heard. Pulmonary/Chest: Effort normal and breath sounds normal. No respiratory distress. She has no wheezes.  Abdominal: Soft. Bowel sounds are normal. She exhibits no distension. There is no tenderness.  Musculoskeletal: Normal range of motion.       Right lower leg: Normal. She exhibits no tenderness and no edema.       Left lower leg: Normal. She exhibits no tenderness and no edema.  Neurological: She is alert and oriented to person, place, and time.  Skin: Skin is warm and dry. She is not diaphoretic.  Nursing note and vitals reviewed.    ED Treatments / Results  Labs (all labs ordered are listed, but only abnormal results are displayed) Labs Reviewed  BASIC METABOLIC PANEL - Abnormal; Notable for the following components:      Result Value   Glucose, Bld 105 (*)    BUN 28 (*)    Creatinine, Ser 1.57 (*)    GFR calc non Af Amer 37 (*)    GFR calc Af Amer 43 (*)    All other components within normal limits  CBC  I-STAT TROPONIN, ED  ED ECG  REPORT   Date: 06/29/2018  Rate: 87  Rhythm: normal sinus rhythm  QRS Axis: indeterminate  Intervals: normal  ST/T Wave abnormalities: nonspecific T wave changes  Conduction Disutrbances:nonspecific intraventricular conduction delay  Narrative Interpretation:   Old EKG Reviewed: unchanged  I have personally reviewed the EKG tracing and agree with the computerized printout as noted.   Radiology Dg Chest 2 View  Result Date: 06/29/2018 CLINICAL DATA:  Left chest pain EXAM: CHEST - 2 VIEW COMPARISON:  CTA chest dated Jan 26, 2018 FINDINGS: Lungs are clear.  No pleural effusion or pneumothorax. The heart is normal in size. Stable mild superior endplate compression fracture deformity of two lower thoracic vertebral bodies, chronic. IMPRESSION: No evidence of acute cardiopulmonary disease. Electronically Signed   By: Julian Hy M.D.   On: 06/29/2018 23:27    Procedures Procedures (including critical care time)  Medications Ordered in ED Medications - No data to display   Initial Impression / Assessment and Plan / ED Course  I have reviewed the triage vital signs and the nursing notes.  Pertinent labs & imaging results that were available during my care of the patient were reviewed by me and considered in my medical decision making (see chart for details).  Troponin x2 is negative and patient appears very comfortable.  This episode of discomfort began while she was in an argument with her daughter and I suspect there may be an emotional component.  I see nothing that indicates an acute cardiac event.  Her heart cath one year ago shows a very small branch of her right coronary that was occluded, however otherwise normal coronaries.  I see no indication for further work-up and believe she is appropriate for discharge.  Final Clinical Impressions(s) / ED Diagnoses   Final diagnoses:  None    ED Discharge Orders    None       Veryl Speak, MD 06/30/18 [phone]

## 2018-06-29 NOTE — ED Triage Notes (Signed)
Pt c/o constant sharp pain to left chest and sorness to both legs since 7pm. Took 4 0.5mg  xanax over last 4 hrs.states did not help. gen weakness noted. Denies sob/v/d. C/o nausea

## 2018-06-30 LAB — I-STAT TROPONIN, ED: Troponin i, poc: 0 ng/mL (ref 0.00–0.08)

## 2018-06-30 MED ORDER — IBUPROFEN 400 MG PO TABS
600.0000 mg | ORAL_TABLET | Freq: Once | ORAL | Status: AC
  Administered 2018-06-30: 600 mg via ORAL

## 2018-06-30 MED ORDER — IBUPROFEN 400 MG PO TABS
ORAL_TABLET | ORAL | Status: AC
  Filled 2018-06-30: qty 2

## 2018-06-30 NOTE — ED Notes (Signed)
Patient asleep at this time. Called her name several times with no response. Patient has equal rise and fall of chest. Equal and even respirations.

## 2018-06-30 NOTE — Discharge Instructions (Addendum)
Continue medications as previously prescribed, and return to the ER if your symptoms significantly worsen or change.

## 2018-07-01 DIAGNOSIS — E663 Overweight: Secondary | ICD-10-CM | POA: Diagnosis not present

## 2018-07-01 DIAGNOSIS — E669 Obesity, unspecified: Secondary | ICD-10-CM | POA: Diagnosis not present

## 2018-07-01 DIAGNOSIS — F329 Major depressive disorder, single episode, unspecified: Secondary | ICD-10-CM | POA: Diagnosis not present

## 2018-07-01 DIAGNOSIS — Z9081 Acquired absence of spleen: Secondary | ICD-10-CM | POA: Diagnosis not present

## 2018-07-01 DIAGNOSIS — Z6829 Body mass index (BMI) 29.0-29.9, adult: Secondary | ICD-10-CM | POA: Diagnosis not present

## 2018-07-01 DIAGNOSIS — F419 Anxiety disorder, unspecified: Secondary | ICD-10-CM | POA: Diagnosis not present

## 2018-07-05 NOTE — Progress Notes (Deleted)
Cardiology Office Note    Date:  07/05/2018   ID:  Summer Cook, DOB [birth year], MRN [id number]  PCP:  Redmond School, MD  Cardiologist: No primary care provider on file. EPS: None  No chief complaint on file.   History of Present Illness:  Summer Cook is a 52 y.o. female with history of CAD status post NSTEMI with cardiac cath 05/2017 mildly elevated troponins and distal occluded RPDA lesion otherwise normal coronary arteries.  LVEF 55 to 60%.  Also has hypertension, CVA, history of PE, history of ITP status post splenectomy, anxiety depression.  Last seen in our office 05/2017 at which time her blood pressures were soft and metoprolol stopped.  Patient was in the emergency room 06/29/2018 with chest pain after arguing with her daughter.  Described as sharp and dull unrelieved with several doses of Xanax. Troponins negative.  In ER 04/29/2018 with chest pain.  CT angios was negative for PE.  Negative troponins and EKG.  Past Medical History:  Diagnosis Date  . Allergy   . Anxiety   . Asthma   . CAD (coronary artery disease), native coronary artery    a. cath 05/20/17 - 100% distal PDA --> medical mangement (no intervention done)  . Candida esophagitis ([place])   . Chest pain at rest 04/2017  . Depression   . Essential hypertension, benign   . GERD (gastroesophageal reflux disease)   . Gout   . Hemorrhoids   . 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  Last was Prednisone '40mg'$ ; 2 wk in 10/2012.  She also was on Prednisone bridged with Cellcept briefly but stopped due to lack of insurance.   . Myocardial infarction ([place])   . Pulmonary embolism ([place]) 04/2012  . Renal insufficiency   . Serrated adenoma of colon   . Steroid-induced diabetes ([place])   . Stroke ([place])   . Thrush, oral 05/29/2011    Past Surgical History:  Procedure Laterality Date  . BONE MARROW BIOPSY    .  CARDIAC CATHETERIZATION    . CARPAL TUNNEL RELEASE    . CARPAL TUNNEL RELEASE Left 05/20/2016   Procedure: CARPAL TUNNEL RELEASE;  Surgeon: Carole Civil, MD;  Location: [facility];  Service: Orthopedics;  Laterality: Left;  . CARPAL TUNNEL RELEASE Right 07/16/2016   Procedure: RIGHT CARPAL TUNNEL RELEASE;  Surgeon: Carole Civil, MD;  Location: [facility];  Service: Orthopedics;  Laterality: Right;  . CATARACT EXTRACTION    . CHOLECYSTECTOMY  2008  . COLONOSCOPY WITH ESOPHAGOGASTRODUODENOSCOPY (EGD) N/A 01/04/2013   Dr. Gala Romney: esophageal plaques with +KOH, hh. Gastric antrum abnormal, bx reactive gastropathy. Anal canal hemorrhoids, colonic tics, normal TI, single polyp (sessile serrated tubular adenoma). Next TCS 12/2017  . LEFT HEART CATH AND CORONARY ANGIOGRAPHY N/A 05/20/2017   Procedure: LEFT HEART CATH AND CORONARY ANGIOGRAPHY;  Surgeon: Martinique, Peter M, MD;  Location: [facility];  Service: Cardiovascular;  Laterality: N/A;  . 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 TEE WITHOUT CARDIOVERSION N/A 01/03/2016   Procedure: TRANSESOPHAGEAL ECHOCARDIOGRAM (TEE) WITH PROPOFOL;  Surgeon: Satira Sark, MD;  Location: [facility];  Service: Cardiovascular;  Laterality: N/A;    Current Medications: No outpatient medications have been marked as taking for the 07/11/18 encounter (Appointment) with Imogene Burn, PA-C.     Allergies:   Brintellix [vortioxetine]; Penicillins; Cymbalta [duloxetine hcl]; Yellow jacket venom; Adhesive [tape]; and Zoloft [sertraline hcl]  Social History   Socioeconomic History  . Marital status: Divorced    Spouse name: Not on file  . Number of children: 1  . Years of education: Not on file  . 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   Social Needs  . Financial resource strain: Not hard at all  . Food insecurity:    Worry: Sometimes true    Inability: Sometimes true  .  Transportation needs:    Medical: No    Non-medical: No  Tobacco Use  . Smoking status: Former Smoker    Years: 0.00    Types: Cigarettes    Last attempt to quit: 02/14/2009    Years since quitting: 9.3  . Smokeless tobacco: Never Used  Substance and Sexual Activity  . Alcohol use: No    Alcohol/week: 0.0 standard drinks  . Drug use: No  . Sexual activity: Never  Lifestyle  . Physical activity:    Days per week: 2 days    Minutes per session: 30 min  . Stress: Rather much  Relationships  . Social connections:    Talks on phone: More than three times a week    Gets together: More than three times a week    Attends religious service: Never    Active member of club or organization: No    Attends meetings of clubs or organizations: Never    Relationship status: Divorced  Other Topics Concern  . Not on file  Social History Narrative   Lives with her daughter and aunt in a one story home.  Has 1 daughter.  On disability.  Did work as a Optometrist and bus Geophysicist/field seismologist.  Education: some college.      Family History:  The patient's ***family history includes AAA (abdominal aortic aneurysm) in her paternal grandmother; Barrett's esophagus in her paternal aunt; Breast cancer in her paternal grandmother; Cervical cancer in her mother; Colon cancer (age of onset: 16) in her paternal grandfather; Colon cancer (age of onset: 52) in her paternal grandmother; Colon polyps in her father; Lung cancer in her father; Pneumonia in her brother.   ROS:   Please see the history of present illness.    ROS All other systems reviewed and are negative.   PHYSICAL EXAM:   VS:  LMP 08/13/2011   Physical Exam  GEN: Well nourished, well developed, in no acute distress  HEENT: normal  Neck: no JVD, carotid bruits, or masses Cardiac:RRR; no murmurs, rubs, or gallops  Respiratory:  clear to auscultation bilaterally, normal work of breathing GI: soft, nontender, nondistended, + BS Ext: without  cyanosis, clubbing, or edema, Good distal pulses bilaterally MS: no deformity or atrophy  Skin: warm and dry, no rash Neuro:  Alert and Oriented x 3, Strength and sensation are intact Psych: euthymic mood, full affect  Wt Readings from Last 3 Encounters:  06/29/18 173 lb (78.5 kg)  04/29/18 173 lb 9.6 oz (78.7 kg)  03/14/18 180 lb (81.6 kg)      Studies/Labs Reviewed:   EKG:  EKG is*** ordered today.  The ekg ordered today demonstrates ***  Recent Labs: 03/14/2018: ALT 19 04/28/2018: B Natriuretic Peptide 39.0 04/29/2018: Magnesium 2.2 06/29/2018: BUN 28; Creatinine, Ser 1.57; Hemoglobin 12.1; Platelets 205; Potassium 4.4; Sodium 141   Lipid Panel    Component Value Date/Time   CHOL 229 (H) 05/19/2017 1912   TRIG 114 05/19/2017 1912  HDL 57 05/19/2017 1912   CHOLHDL 4.0 05/19/2017 1912   VLDL 23 05/19/2017 1912   LDLCALC 149 (H) 05/19/2017 1912    Additional studies/ records that were reviewed today include:  05/20/2017, the patient underwent cardiac catheterization: Conclusion       RPDA lesion, 100 %stenosed.  The left ventricular systolic function is normal.  LV end diastolic pressure is normal.  The left ventricular ejection fraction is 55-65% by visual estimate.   1. 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 Otherwise normal coronary arteries. 3. Normal LV function with very small focal area of distal inferior hypokinesis.   Plan: medical therapy.    LEFT HEART CATH AND CORONARY ANGIOGRAPHY  Conclusion       RPDA lesion, 100 %stenosed.  The left ventricular systolic function is normal.  LV end diastolic pressure is normal.  The left ventricular ejection fraction is 55-65% by visual estimate.   1. 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 Otherwise normal coronary arteries. 3. Normal LV function with very small focal area of distal inferior hypokinesis.   Plan: medical therapy.    2D Echo 05/21/17 Study Conclusions     - Left ventricle: The cavity size was normal. Systolic function was   normal. Wall motion was normal; there were no regional wall   motion abnormalities. Doppler parameters are consistent with   abnormal left ventricular relaxation (grade 1 diastolic   dysfunction). Doppler parameters are consistent with elevated   ventricular end-diastolic filling pressure. - Aortic valve: There was no regurgitation. - Mitral valve: There was mild regurgitation. - Right ventricle: The cavity size was normal. Wall thickness was   normal. Systolic function was normal. - Right atrium: The atrium was normal in size. - Tricuspid valve: There was no regurgitation. - Pulmonic valve: There was no regurgitation. - Pulmonary arteries: Systolic pressure could not be accurately   estimated. - Inferior vena cava: The vessel was normal in size. - Pericardium, extracardiac: There was no pericardial effusion.       ASSESSMENT:    1. Coronary artery disease involving native coronary artery of native heart without angina pectoris   2. Mixed hyperlipidemia   3. History of stroke   4. CKD (chronic kidney disease) stage 3, GFR 30-59 ml/min (HCC)      PLAN:  In order of problems listed above:  CAD status post NSTEMI 04/2017 with total R PDA otherwise normal coronary arteries, normal LVEF 55 to 65%.  Discharge from the hospital with reproducible chest pain 04/2018 CTA negative for PE, troponins negative EKG unchanged.  Back in the ER with chest pain after arguing with her daughter 06/29/2018 again enzymes negative.  Hyperlipidemia  History of CVA  CKD stage III    Medication Adjustments/Labs and Tests Ordered: Current medicines are reviewed at length with the patient today.  Concerns regarding medicines are outlined above.  Medication changes, Labs and Tests ordered today are listed in the Patient Instructions below. There are no Patient Instructions on file for this visit.   Signed, Ermalinda Barrios, PA-C   07/05/2018 2:27 PM    [facility] [address] [employer], [place], [place]  [address] Phone: [phone]; Fax: [phone]

## 2018-07-11 ENCOUNTER — Ambulatory Visit: Payer: Medicare HMO | Admitting: Physician Assistant

## 2018-07-11 DIAGNOSIS — R0989 Other specified symptoms and signs involving the circulatory and respiratory systems: Secondary | ICD-10-CM

## 2018-07-13 DIAGNOSIS — Z6829 Body mass index (BMI) 29.0-29.9, adult: Secondary | ICD-10-CM | POA: Diagnosis not present

## 2018-07-13 DIAGNOSIS — F419 Anxiety disorder, unspecified: Secondary | ICD-10-CM | POA: Diagnosis not present

## 2018-07-13 DIAGNOSIS — E663 Overweight: Secondary | ICD-10-CM | POA: Diagnosis not present

## 2018-07-13 DIAGNOSIS — E1129 Type 2 diabetes mellitus with other diabetic kidney complication: Secondary | ICD-10-CM | POA: Diagnosis not present

## 2018-07-13 DIAGNOSIS — G894 Chronic pain syndrome: Secondary | ICD-10-CM | POA: Diagnosis not present

## 2018-08-06 IMAGING — DX DG KNEE COMPLETE 4+V*L*
4 series · 4 of 4 positions shown · non-contrast
Comparison: 01/14/2017 prior exams

CLINICAL DATA: Acute left knee pain following fall today. Initial
encounter.

EXAM:
LEFT KNEE - COMPLETE 4+ VIEW

[knee ap]
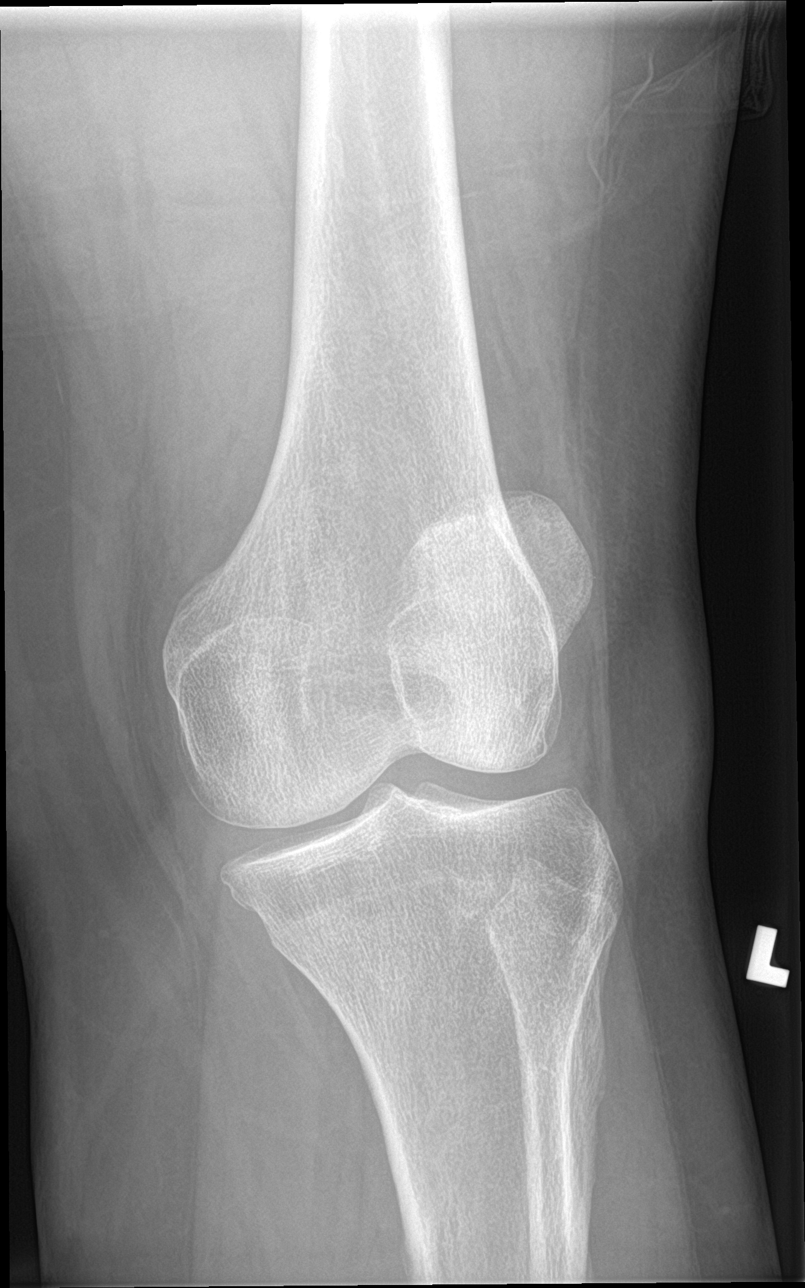

[tunnel]
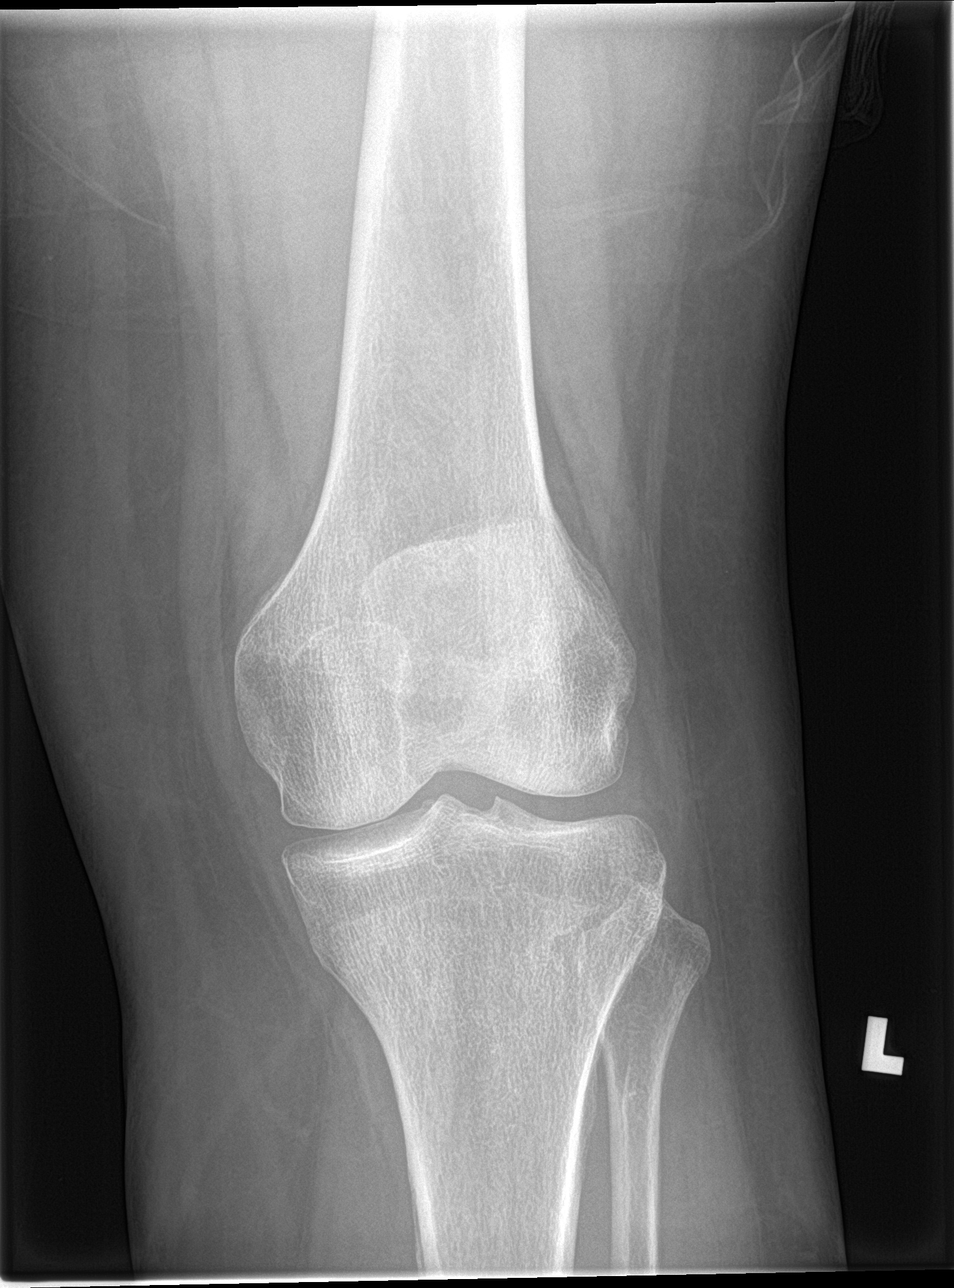

[knee lat]
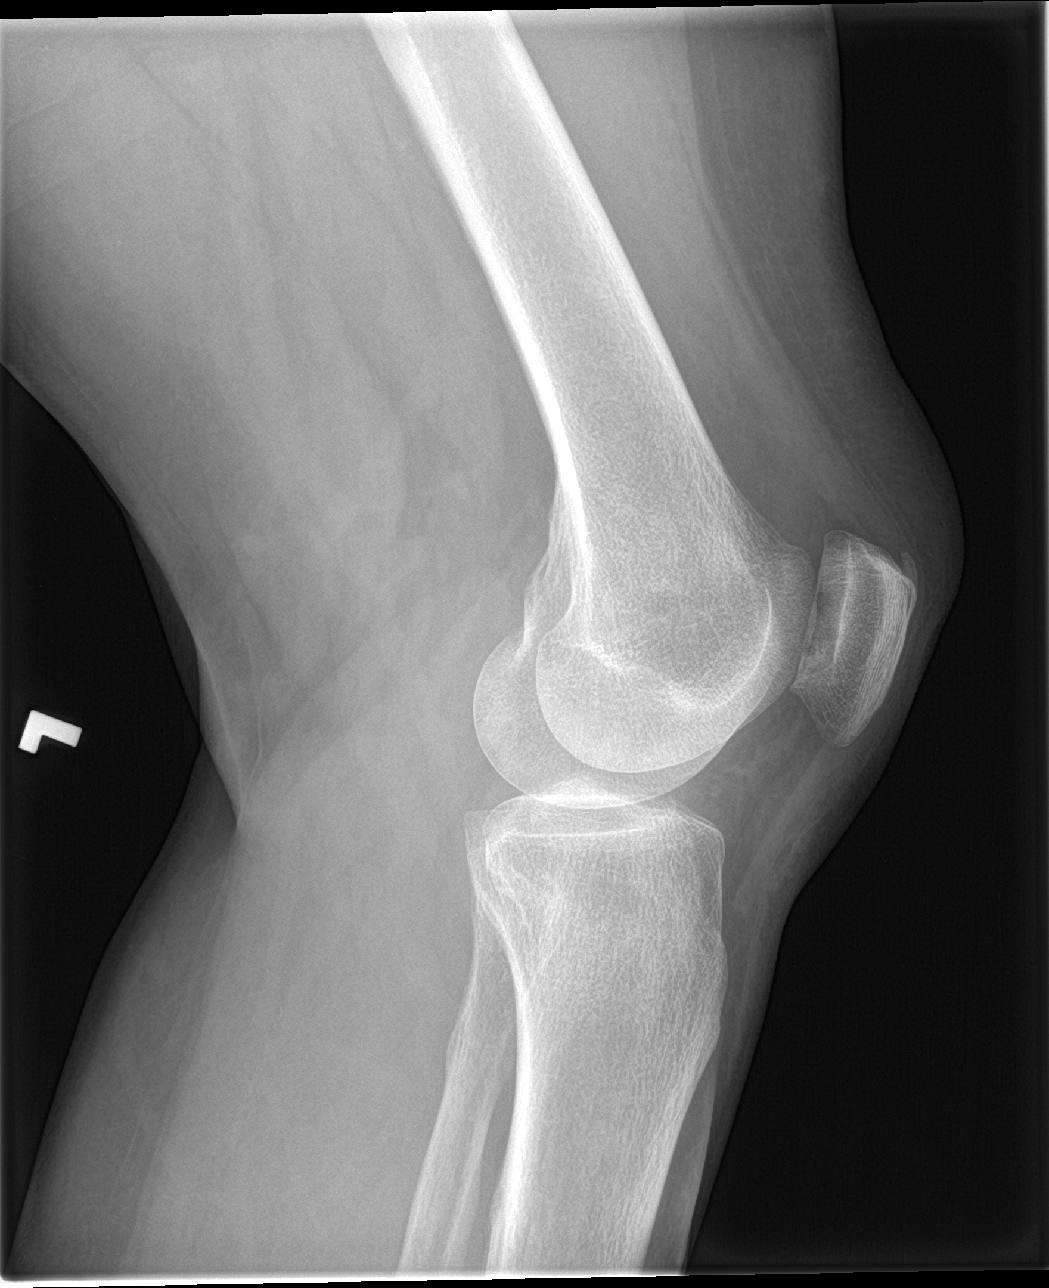

[knee sunrise]
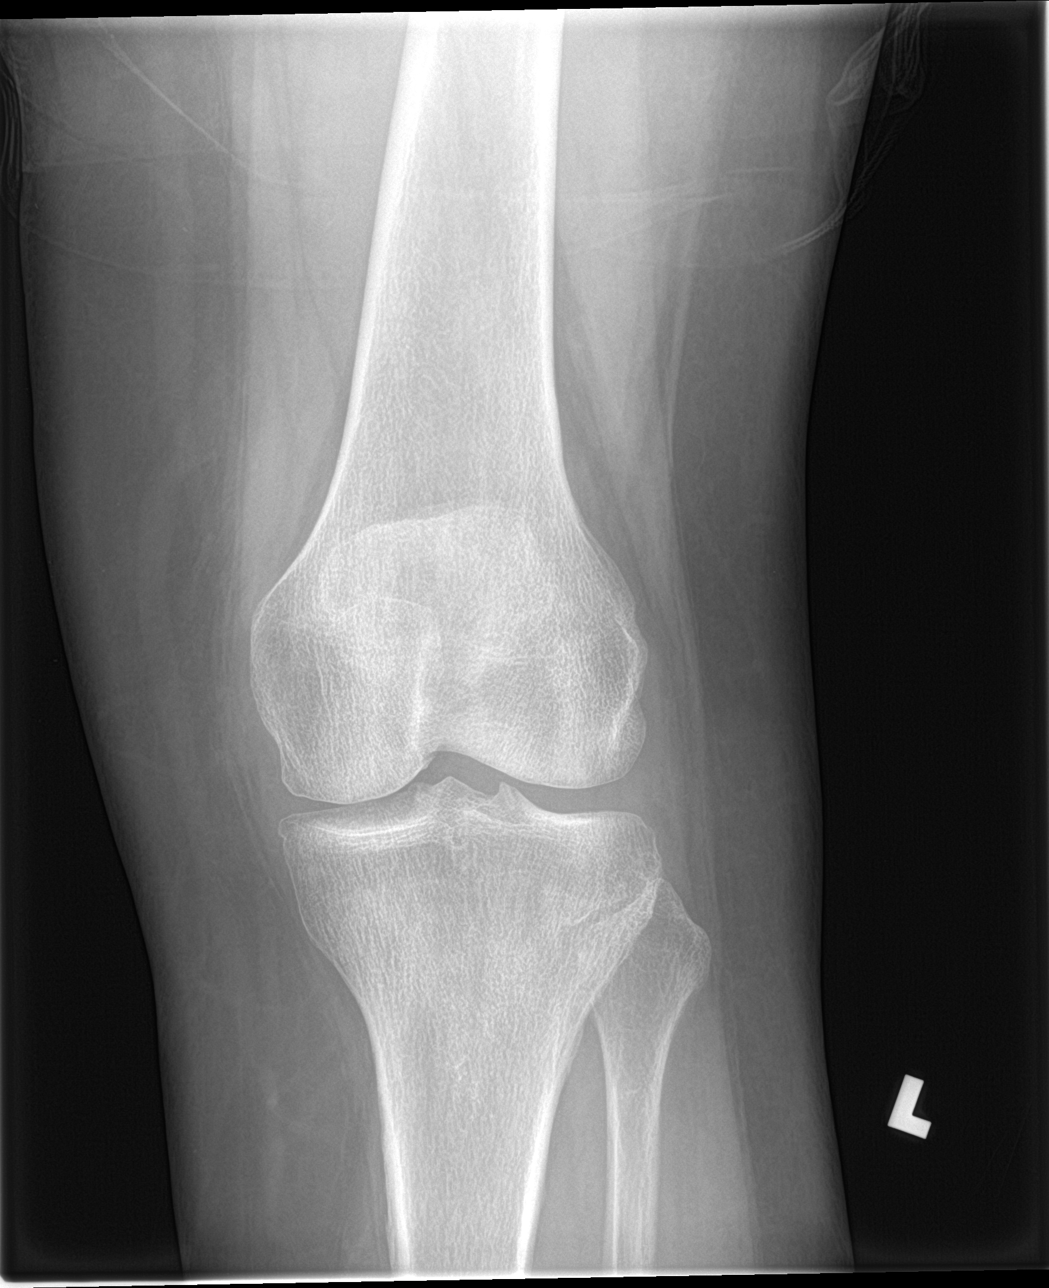

[4 of 4 positions shown; findings below may reference images not displayed]

FINDINGS: No evidence of fracture, dislocation, or joint effusion. No evidence
of arthropathy or other focal bone abnormality. Soft tissues are
unremarkable.
IMPRESSION: Negative.

## 2018-08-24 ENCOUNTER — Other Ambulatory Visit (HOSPITAL_COMMUNITY): Payer: Medicare HMO

## 2018-08-31 DIAGNOSIS — Z6828 Body mass index (BMI) 28.0-28.9, adult: Secondary | ICD-10-CM | POA: Diagnosis not present

## 2018-08-31 DIAGNOSIS — F419 Anxiety disorder, unspecified: Secondary | ICD-10-CM | POA: Diagnosis not present

## 2018-08-31 DIAGNOSIS — G894 Chronic pain syndrome: Secondary | ICD-10-CM | POA: Diagnosis not present

## 2018-08-31 DIAGNOSIS — E663 Overweight: Secondary | ICD-10-CM | POA: Diagnosis not present

## 2018-08-31 DIAGNOSIS — R51 Headache: Secondary | ICD-10-CM | POA: Diagnosis not present

## 2018-08-31 DIAGNOSIS — E1129 Type 2 diabetes mellitus with other diabetic kidney complication: Secondary | ICD-10-CM | POA: Diagnosis not present

## 2018-08-31 DIAGNOSIS — I10 Essential (primary) hypertension: Secondary | ICD-10-CM | POA: Diagnosis not present

## 2018-08-31 DIAGNOSIS — N183 Chronic kidney disease, stage 3 (moderate): Secondary | ICD-10-CM | POA: Diagnosis not present

## 2018-09-07 ENCOUNTER — Ambulatory Visit: Payer: Medicare HMO | Admitting: Gastroenterology

## 2018-09-30 DIAGNOSIS — N184 Chronic kidney disease, stage 4 (severe): Secondary | ICD-10-CM | POA: Diagnosis not present

## 2018-09-30 DIAGNOSIS — Z1389 Encounter for screening for other disorder: Secondary | ICD-10-CM | POA: Diagnosis not present

## 2018-09-30 DIAGNOSIS — G894 Chronic pain syndrome: Secondary | ICD-10-CM | POA: Diagnosis not present

## 2018-09-30 DIAGNOSIS — E1129 Type 2 diabetes mellitus with other diabetic kidney complication: Secondary | ICD-10-CM | POA: Diagnosis not present

## 2018-10-21 ENCOUNTER — Encounter: Payer: Self-pay | Admitting: Adult Health

## 2018-10-25 IMAGING — CT CT ANGIO CHEST
2 of 6 series · 19 of 36 positions shown · IV contrast (Isovue)
Comparison: None.

CLINICAL DATA: Chest pain.  History of pulmonary embolism

EXAM:
CT ANGIOGRAPHY CHEST WITH CONTRAST
TECHNIQUE: Multidetector CT imaging of the chest was performed using the
standard protocol during bolus administration of intravenous
contrast. Multiplanar CT image reconstructions and MIPs were
obtained to evaluate the vascular anatomy.
CONTRAST:  75 mL Isovue

[Series 5: thins · axial · 0.73mm/px · z∈[-219,+14]mm · 18 of 259 slices shown]
[im 13/259  lung]
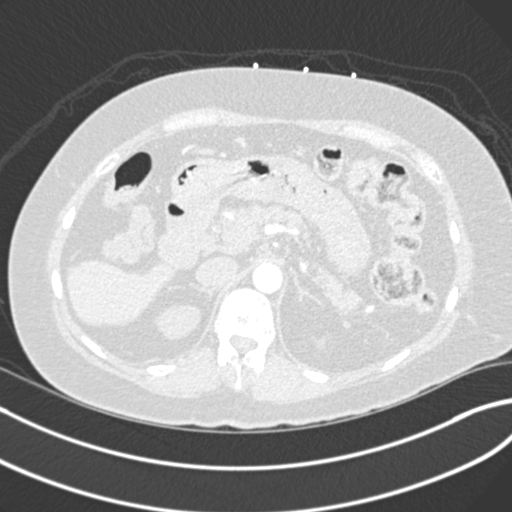
[im 26/259  mediastinal]
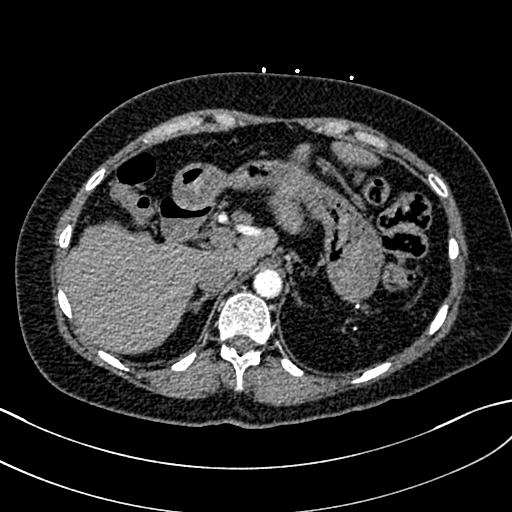
[im 39/259  lung]
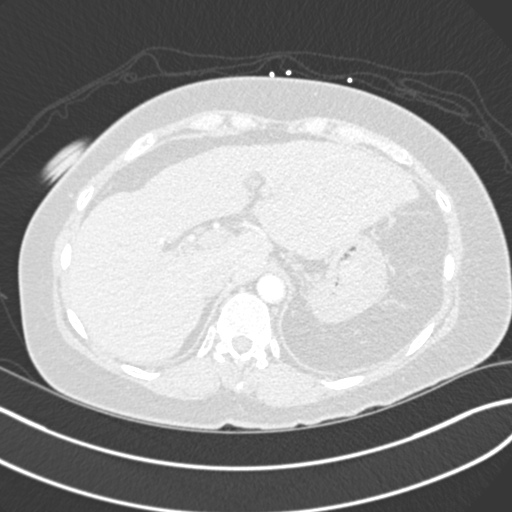
[im 52/259  mediastinal]
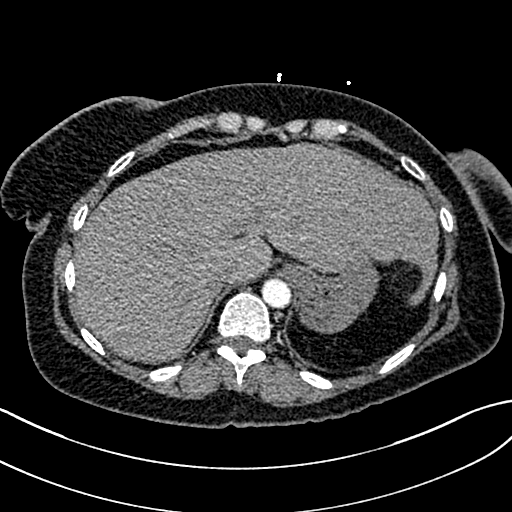
[im 65/259  lung]
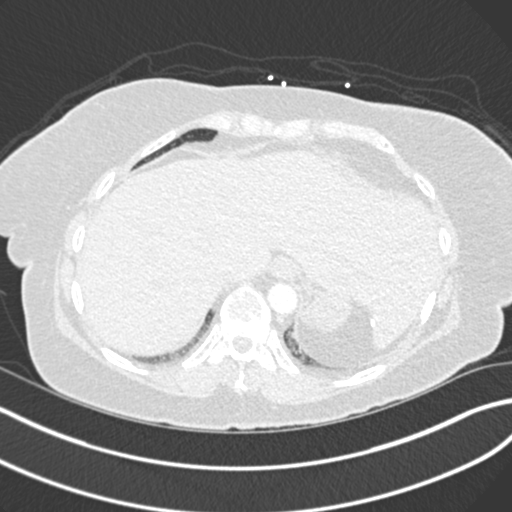
[im 78/259  mediastinal]
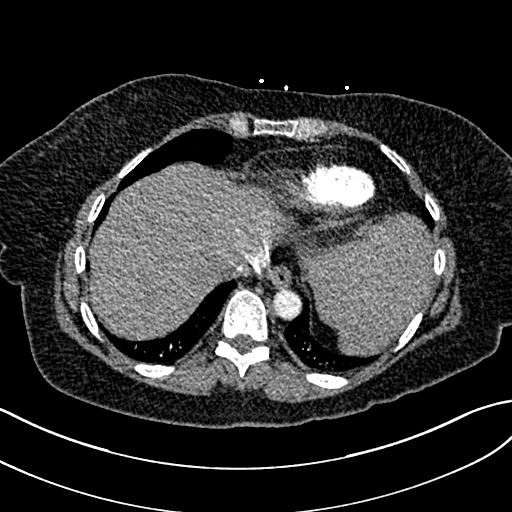
[im 91/259  lung]
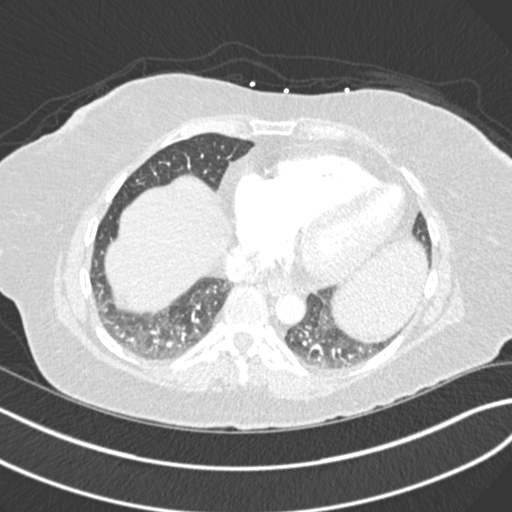
[im 104/259  mediastinal]
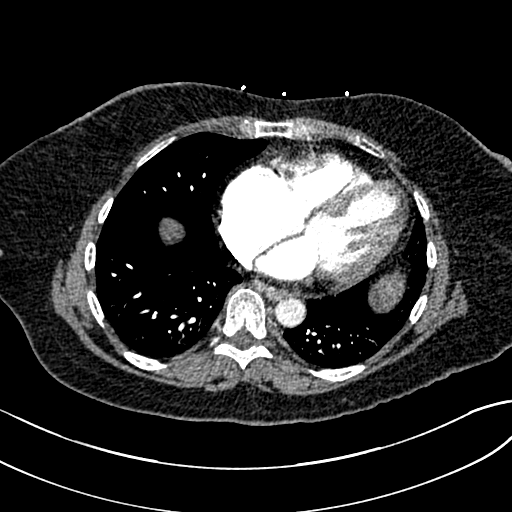
[im 117/259  lung]
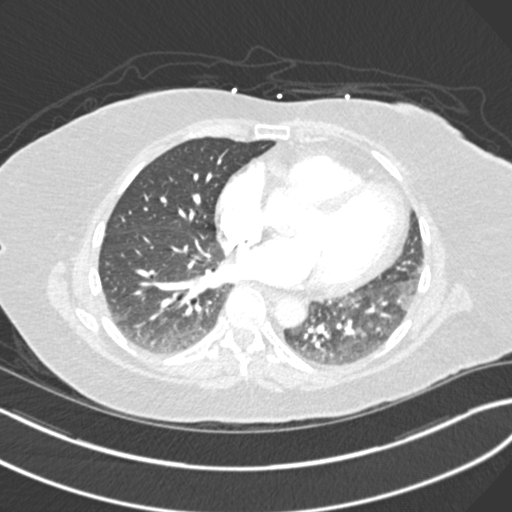
[im 142/259  mediastinal]
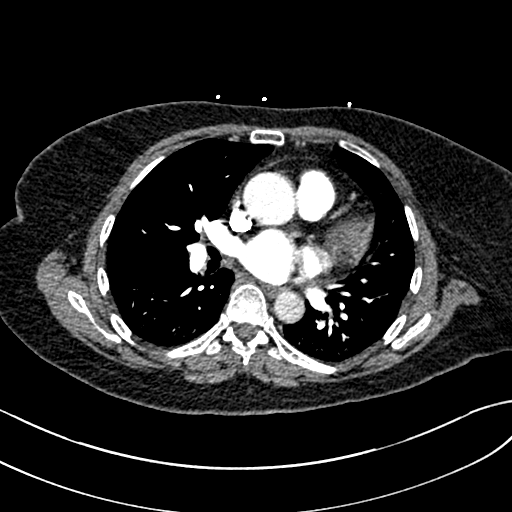
[im 155/259  lung]
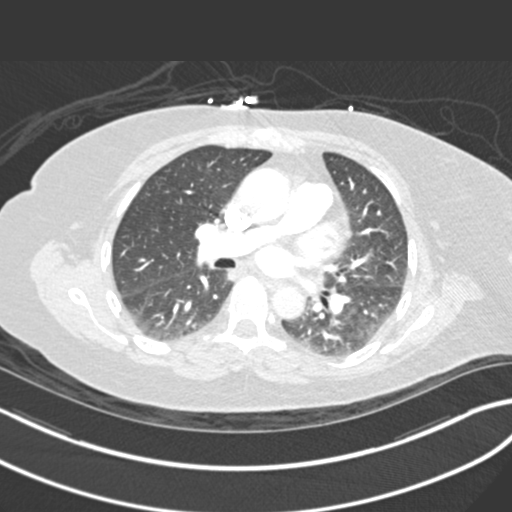
[im 168/259  mediastinal]
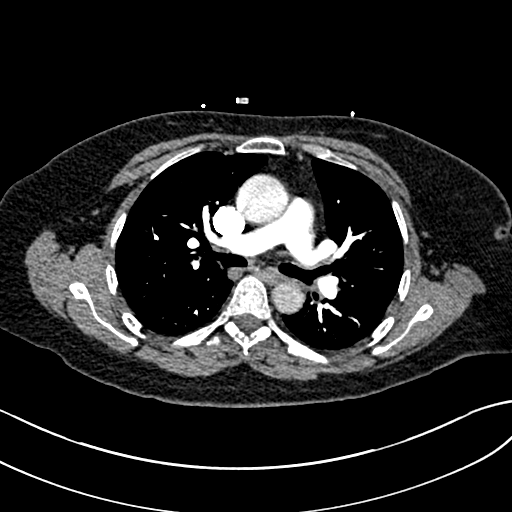
[im 181/259  lung]
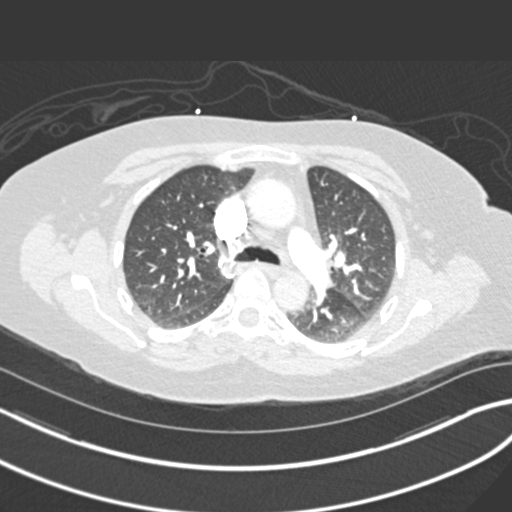
[im 194/259  mediastinal]
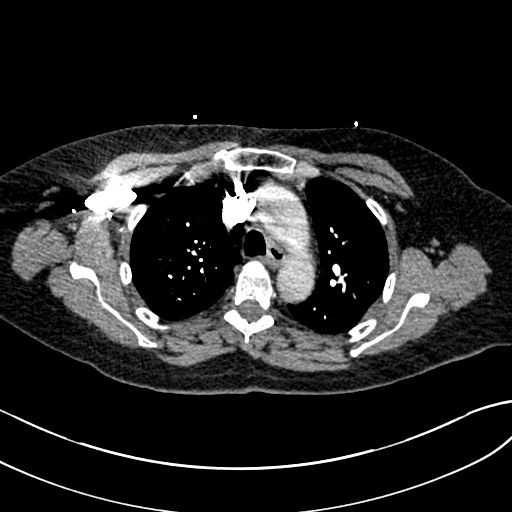
[im 207/259  lung]
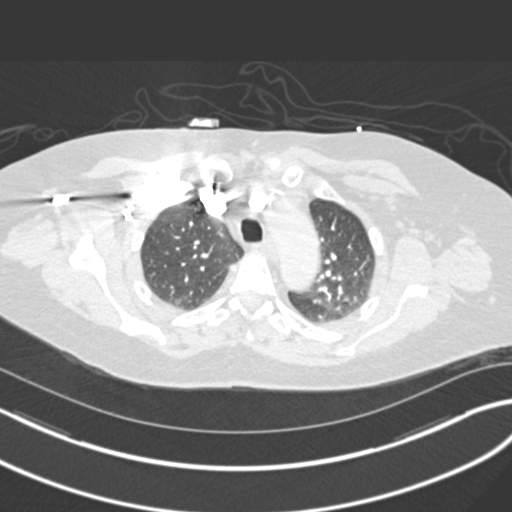
[im 220/259  mediastinal]
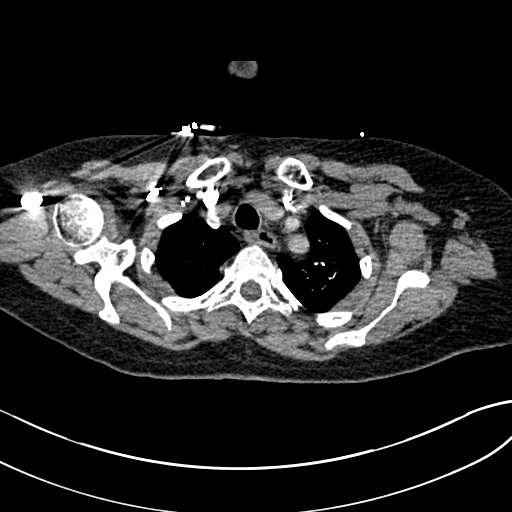
[im 233/259  lung]
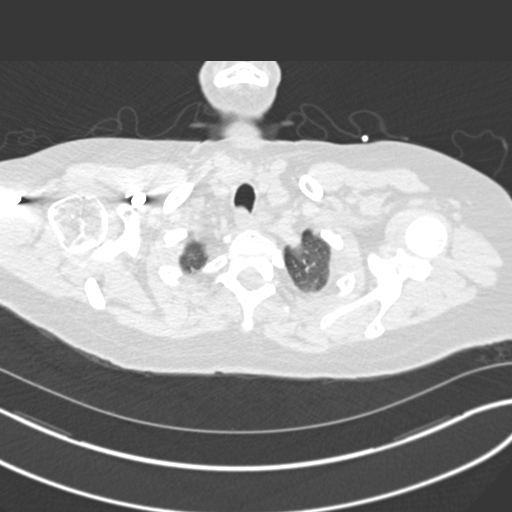
[im 246/259  mediastinal]
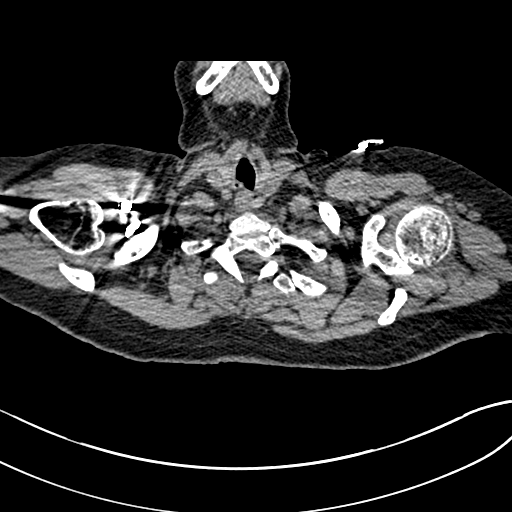

[Series 8: coronal mpr · coronal · 0.51mm/px · 1 of 127 slices shown]
[im 64/127  mediastinal]
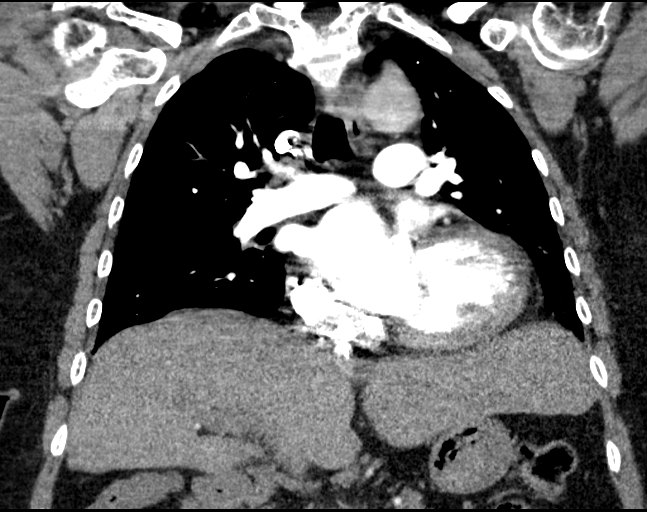

[19 of 36 positions shown; findings below may reference images not displayed]

FINDINGS: Cardiovascular: No filling defects within the pulmonary arteries to
suggest acute pulmonary embolism. No acute findings of the aorta or
great vessels. No pericardial fluid.

No acute findings of the aorta great vessels.  No pericardial fluid.

Mediastinum/Nodes: No axillary supraclavicular adenopathy. No
mediastinal or hilar adenopathy. No pericardial fluid esophagus
normal

Lungs/Pleura: No pulmonary infarction. Mild basilar atelectasis. No
pneumonia. No pneumothorax.

Upper Abdomen: Limited view of the liver, kidneys, pancreas are
unremarkable. Normal adrenal glands.

Musculoskeletal: No aggressive osseous lesion.

Review of the MIP images confirms the above findings.
IMPRESSION: 1. No evidence acute pulmonary embolism.
2. No acute pulmonary parenchymal findings.

## 2018-10-25 IMAGING — DX DG CHEST 2V
2 series · 2 of 2 positions shown · non-contrast
Comparison: 01/14/2017

CLINICAL DATA: Chest pain for 2 hours.

EXAM:
CHEST  2 VIEW

[chest pa]
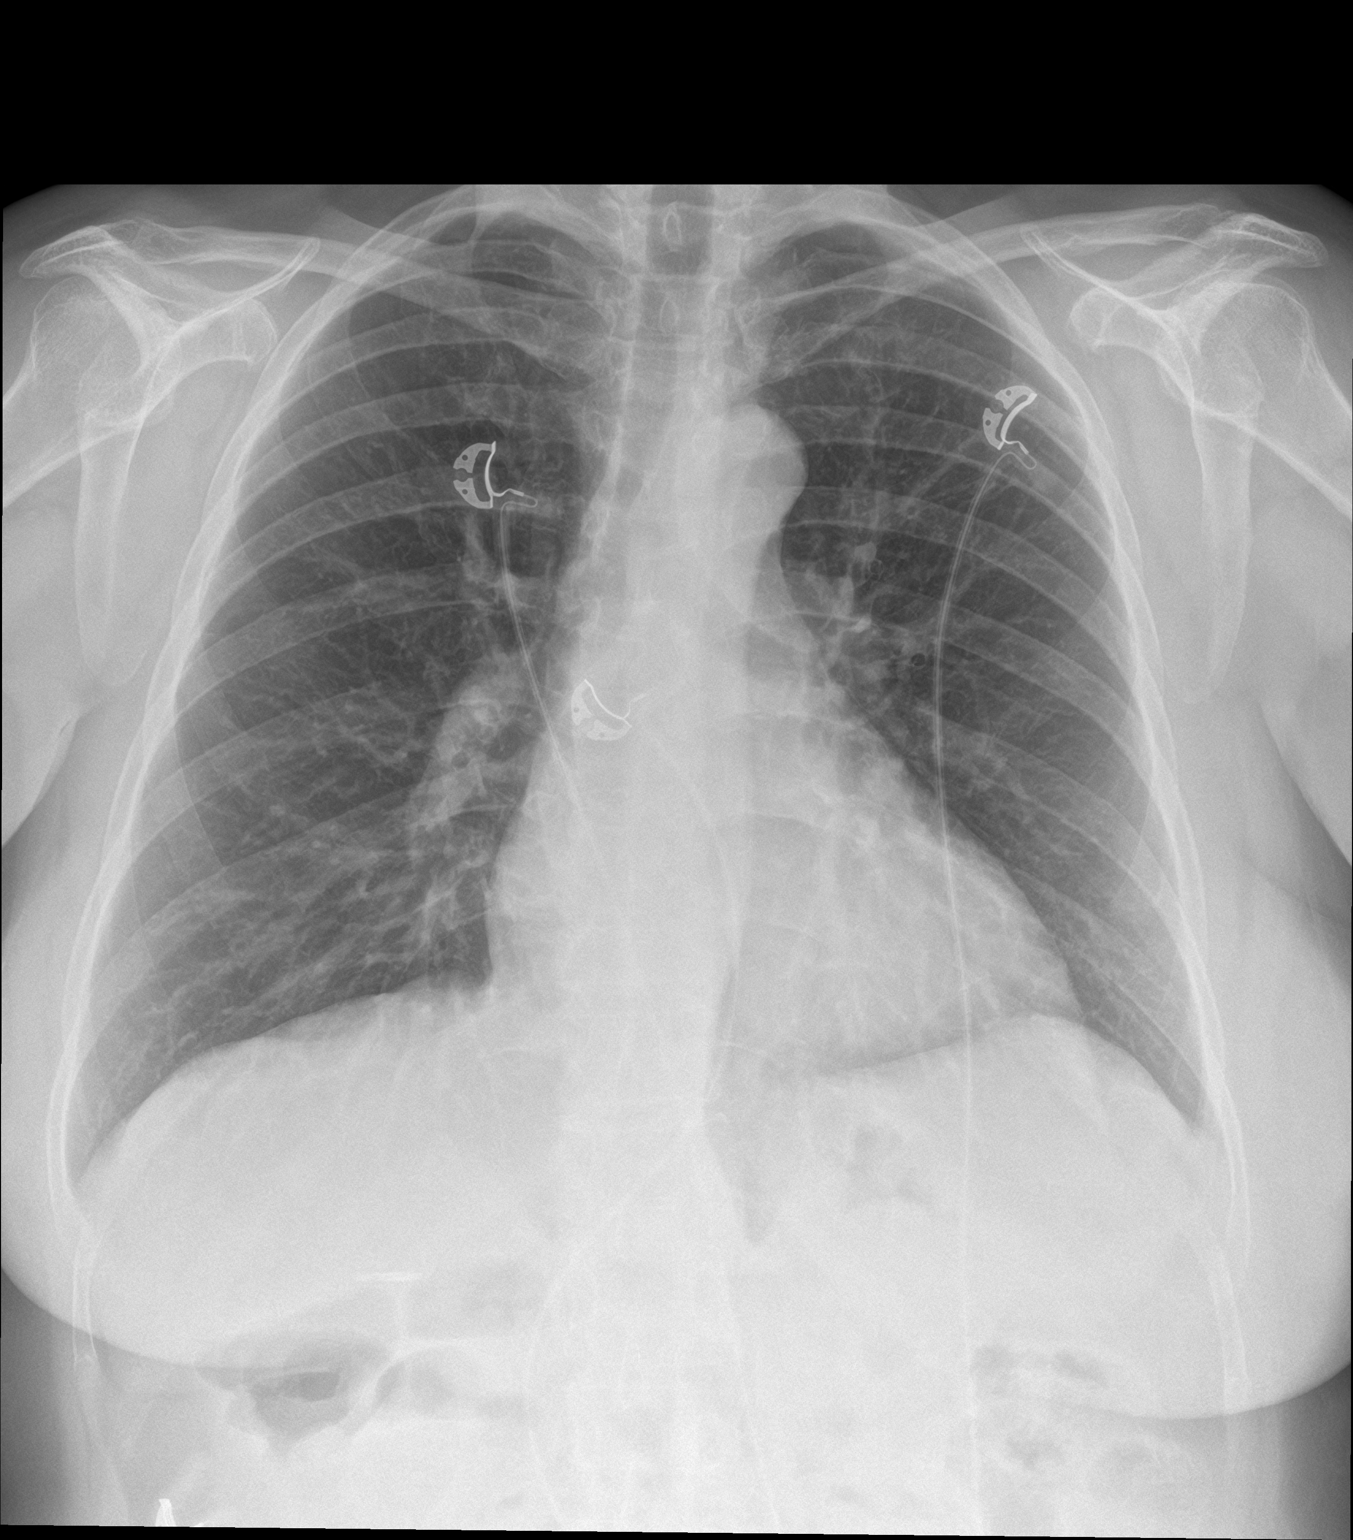

[chest lat]
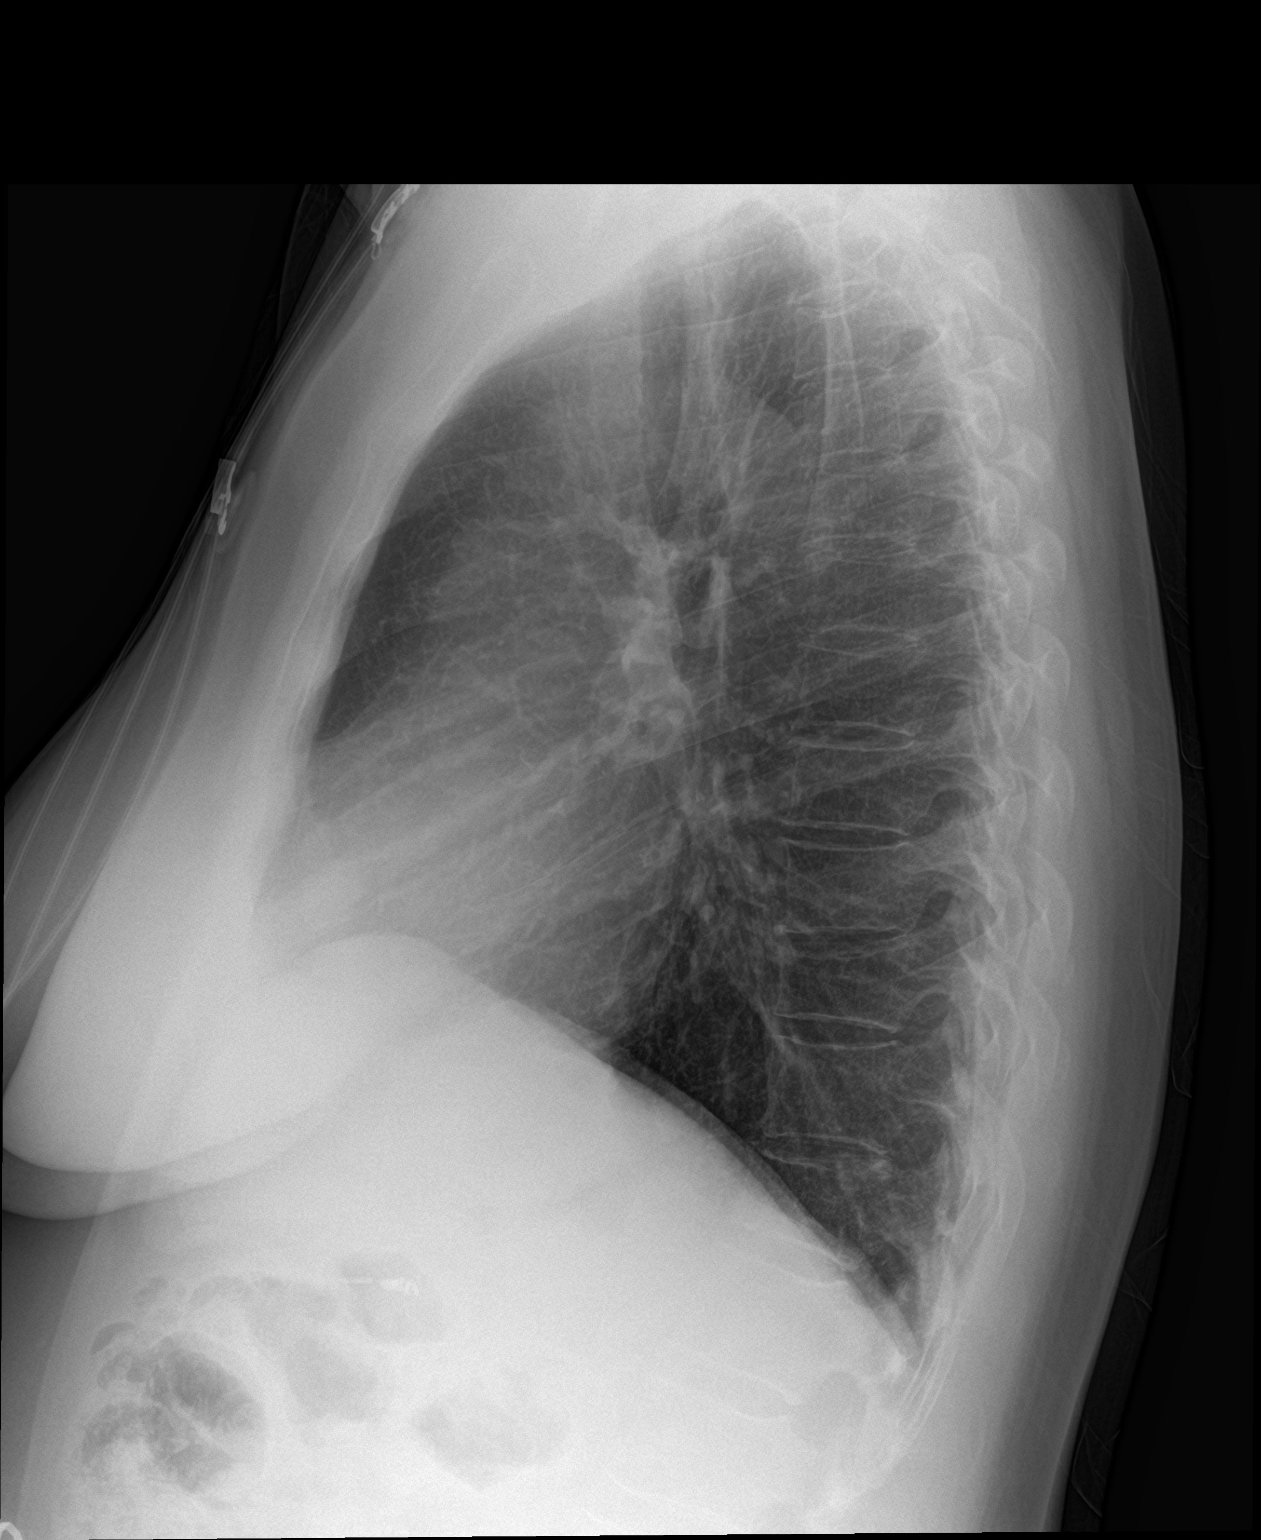

[2 of 2 positions shown; findings below may reference images not displayed]

FINDINGS: The cardiac silhouette, mediastinal and hilar contours are within
normal limits and stable. The lungs are clear. No pleural effusion.
The bony thorax is intact. Remote compression deformities are noted
involving T12 and T10.
IMPRESSION: No acute cardiopulmonary findings.

## 2018-10-26 ENCOUNTER — Other Ambulatory Visit (HOSPITAL_COMMUNITY): Payer: Self-pay | Admitting: Internal Medicine

## 2018-10-26 DIAGNOSIS — Z1231 Encounter for screening mammogram for malignant neoplasm of breast: Secondary | ICD-10-CM

## 2018-10-31 DIAGNOSIS — E1129 Type 2 diabetes mellitus with other diabetic kidney complication: Secondary | ICD-10-CM | POA: Diagnosis not present

## 2018-10-31 DIAGNOSIS — G894 Chronic pain syndrome: Secondary | ICD-10-CM | POA: Diagnosis not present

## 2018-10-31 DIAGNOSIS — Z0001 Encounter for general adult medical examination with abnormal findings: Secondary | ICD-10-CM | POA: Diagnosis not present

## 2018-10-31 DIAGNOSIS — I200 Unstable angina: Secondary | ICD-10-CM | POA: Diagnosis not present

## 2018-10-31 DIAGNOSIS — Z1389 Encounter for screening for other disorder: Secondary | ICD-10-CM | POA: Diagnosis not present

## 2018-10-31 DIAGNOSIS — I10 Essential (primary) hypertension: Secondary | ICD-10-CM | POA: Diagnosis not present

## 2018-11-04 IMAGING — CR DG CHEST 1V PORT
1 series · 1 of 1 positions shown · non-contrast
Comparison: 05/23/2017

CLINICAL DATA: Chest pain

EXAM:
PORTABLE CHEST 1 VIEW

[portable]
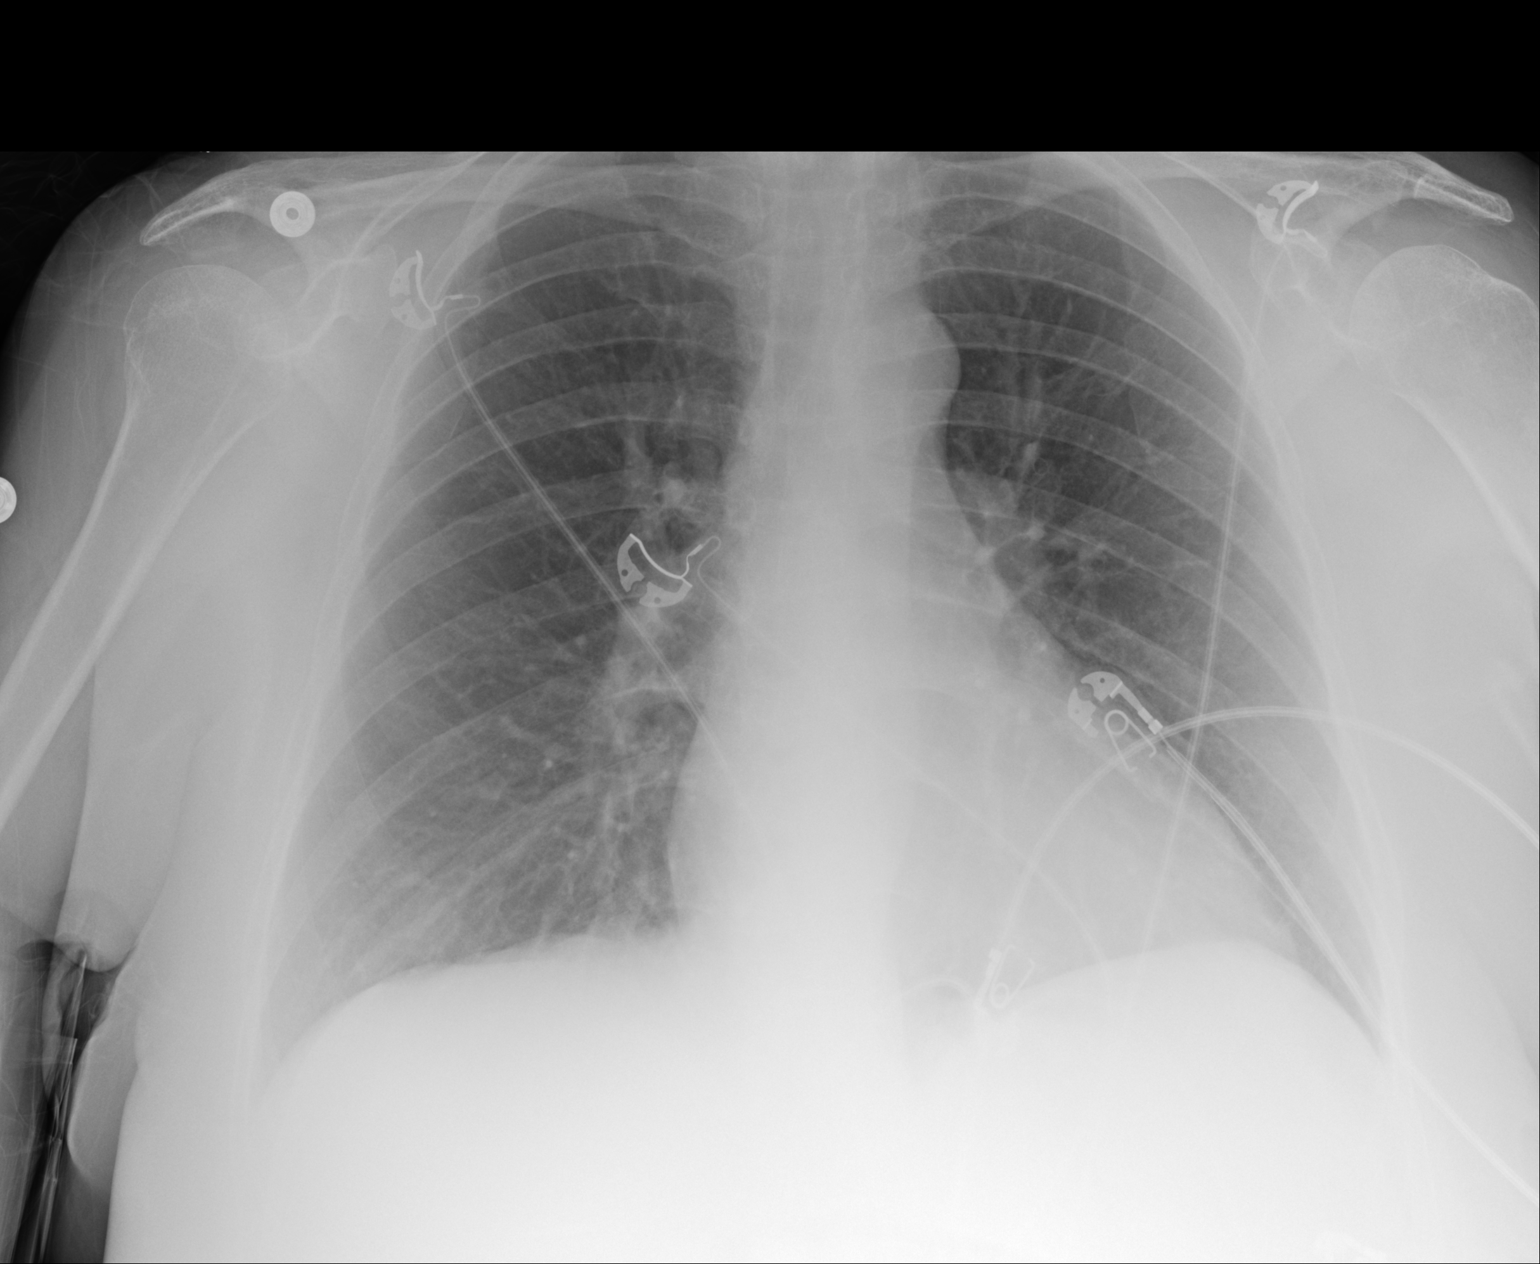

[1 of 1 positions shown; findings below may reference images not displayed]

FINDINGS: The heart size and mediastinal contours are within normal limits.
Both lungs are clear. The visualized skeletal structures are
unremarkable.
IMPRESSION: No active disease.

## 2018-11-11 IMAGING — CR DG CHEST 2V
2 series · 2 of 2 positions shown · non-contrast
Comparison: Chest x-ray a 05/29/2017.

CLINICAL DATA: 51-year-old female with history of pain in the
central and left-sided chest radiating into the back. Headache and
weakness since this morning.

EXAM:
CHEST  2 VIEW

[chest pa]
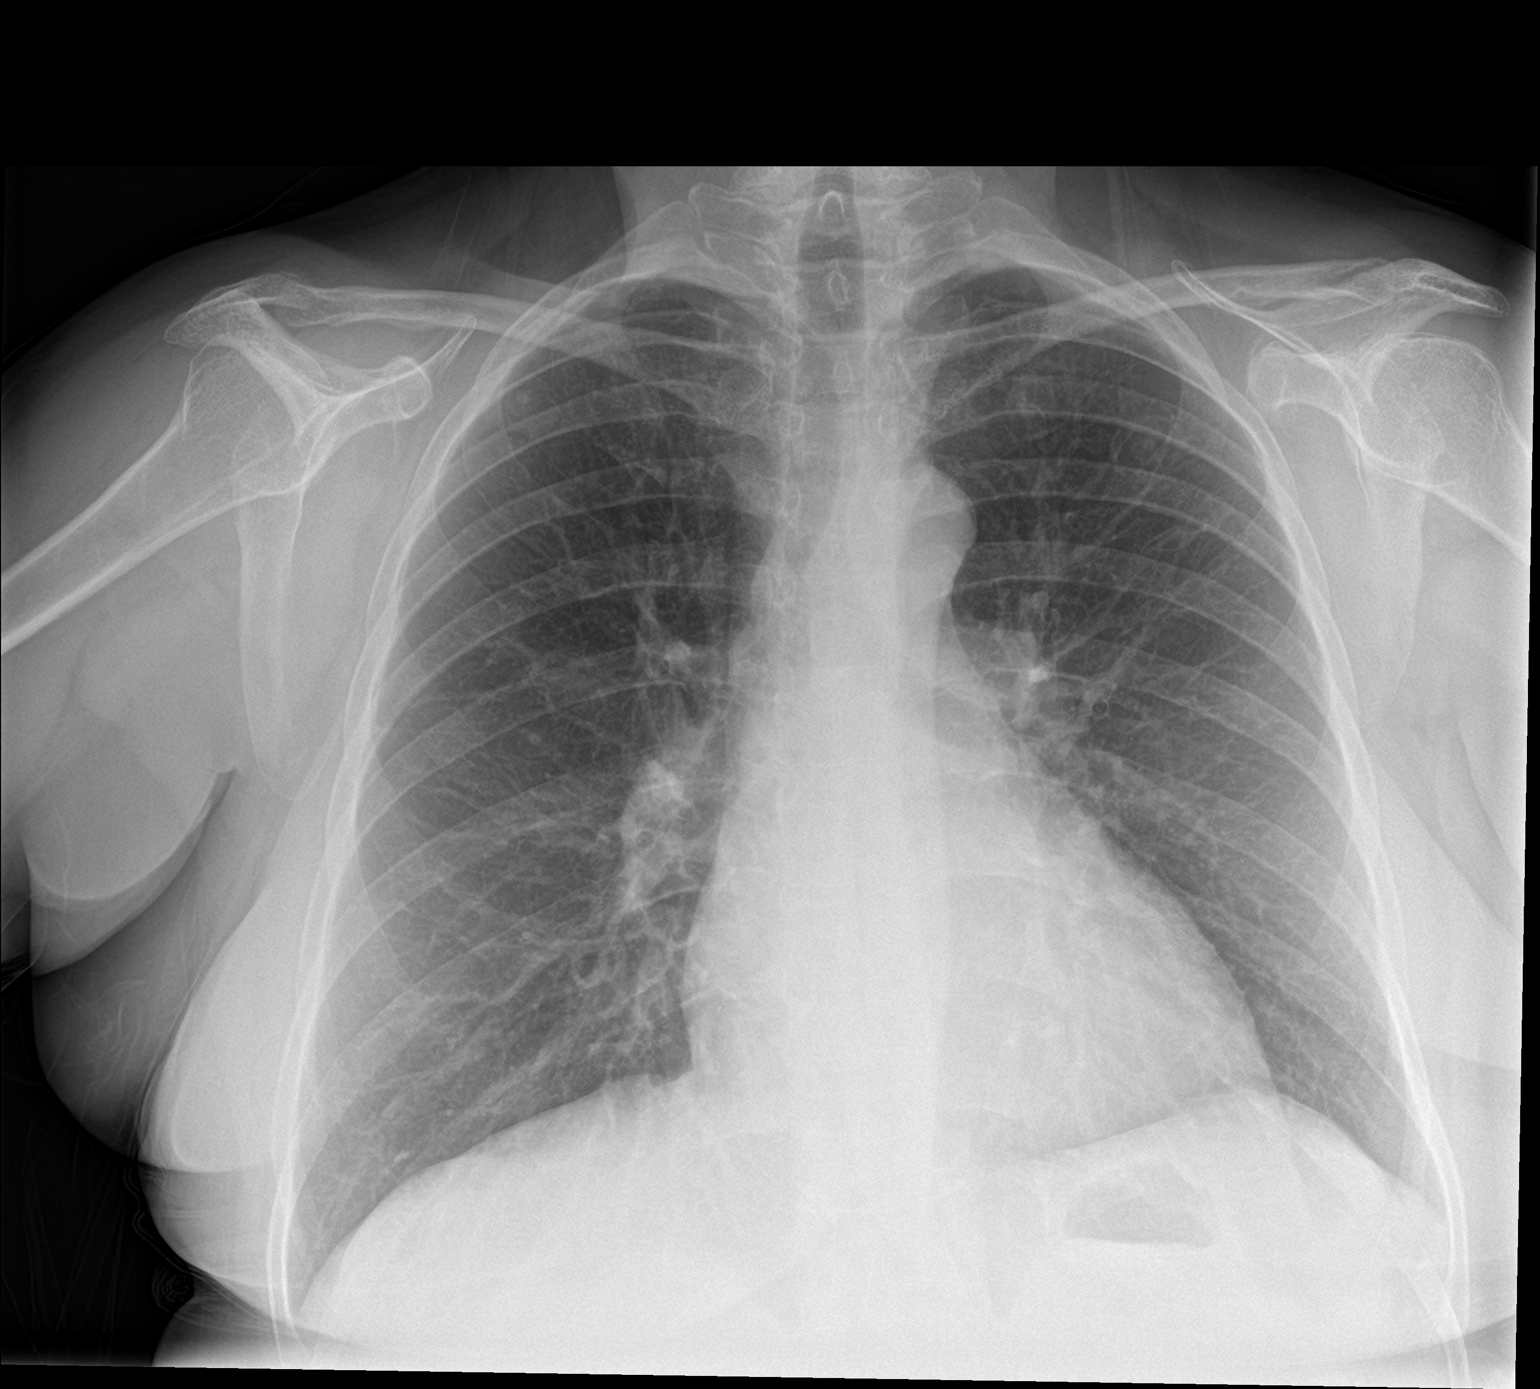

[chest lat]
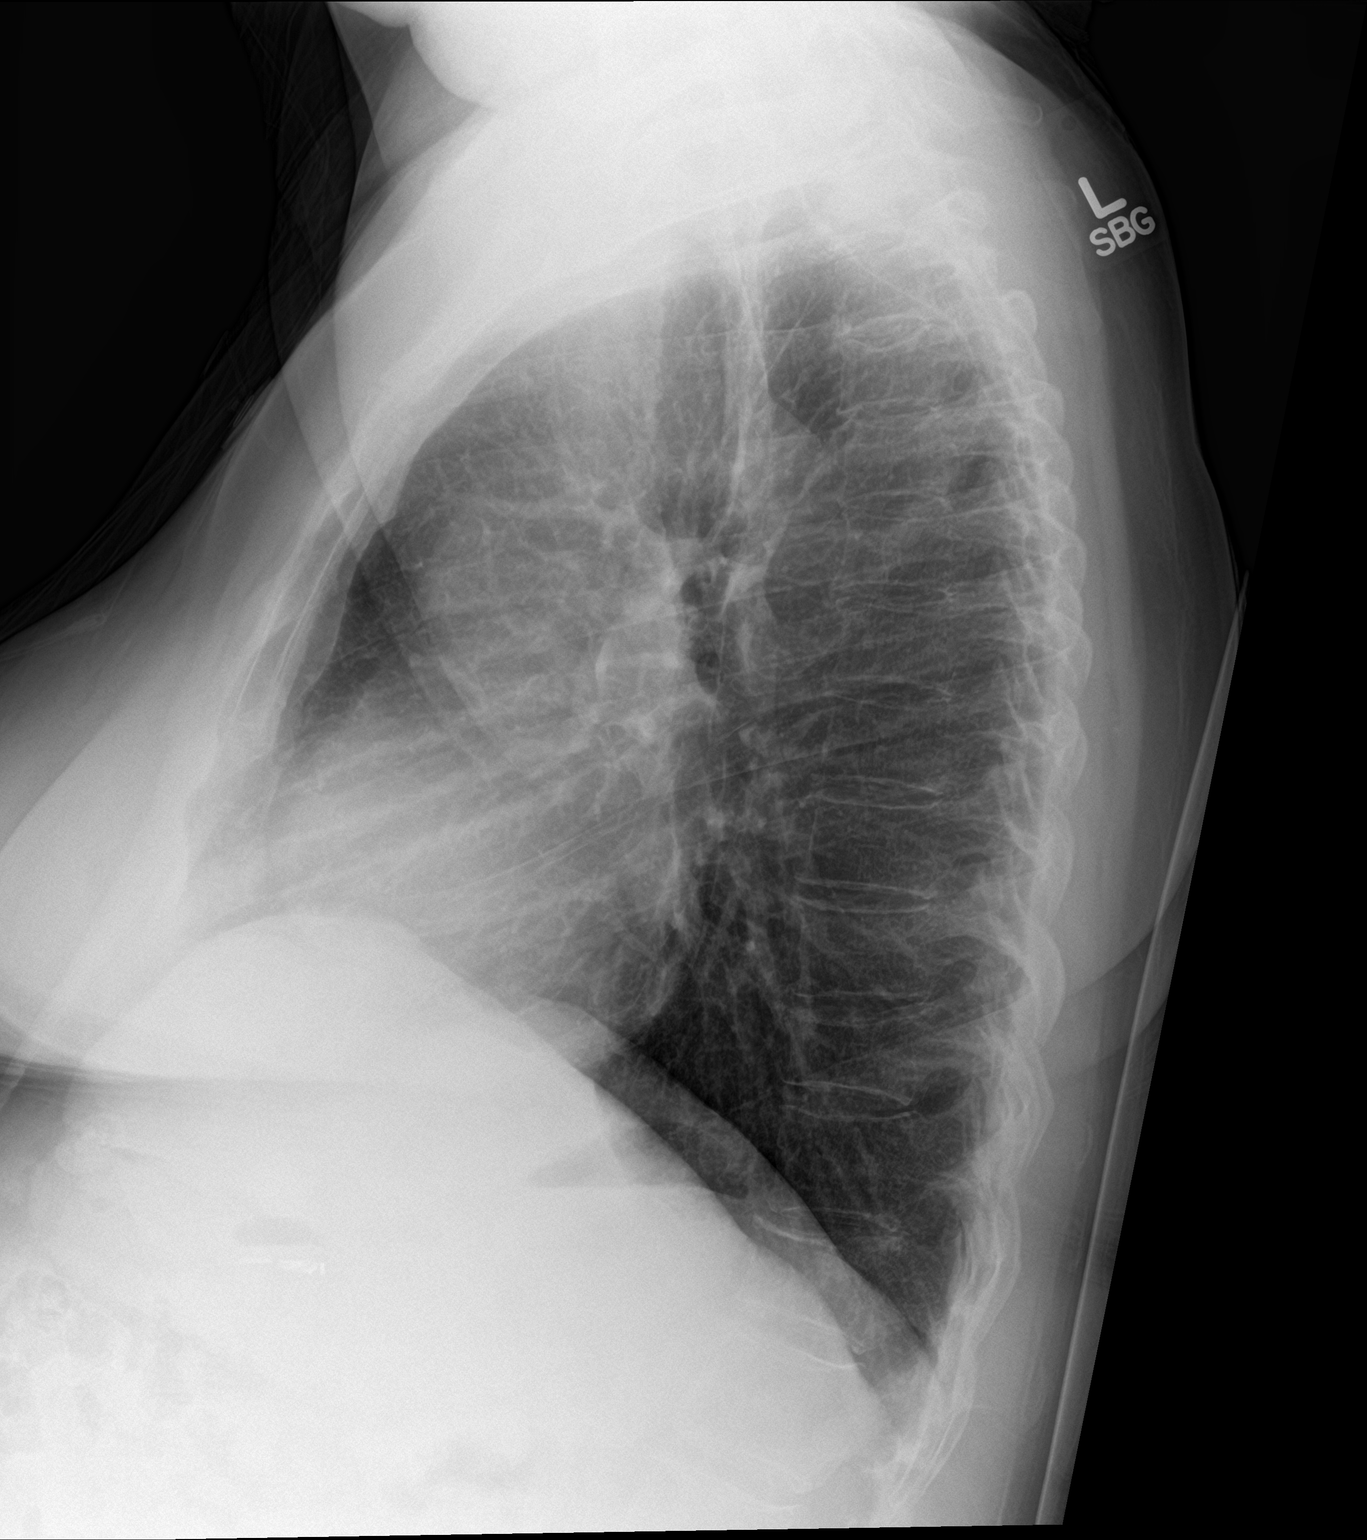

[2 of 2 positions shown; findings below may reference images not displayed]

FINDINGS: Lung volumes are normal. No consolidative airspace disease. No
pleural effusions. No pneumothorax. No pulmonary nodule or mass
noted. Pulmonary vasculature and the cardiomediastinal silhouette
are within normal limits.
IMPRESSION: No radiographic evidence of acute cardiopulmonary disease.

## 2018-11-14 ENCOUNTER — Ambulatory Visit (HOSPITAL_COMMUNITY): Payer: Medicare HMO

## 2018-11-20 IMAGING — CT CT RENAL STONE PROTOCOL
2 of 4 series · 16 of 46 positions shown, 18 images · non-contrast
Comparison: 01/19/2015

CLINICAL DATA: Lower right flank pain starting today.

EXAM:
CT ABDOMEN AND PELVIS WITHOUT CONTRAST
TECHNIQUE: Multidetector CT imaging of the abdomen and pelvis was performed
following the standard protocol without IV contrast.

[Series 2: axial st · axial · 0.74mm/px · z∈[-626,-196]mm · 13 of 96 slices shown, 15 images]
[im 5/96  soft-tissue]
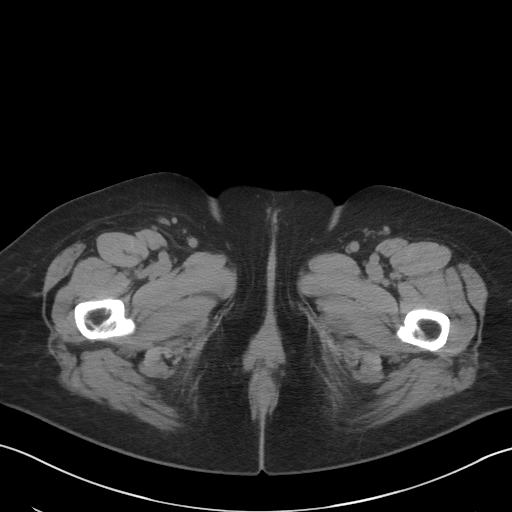
[im 5/96  bone]
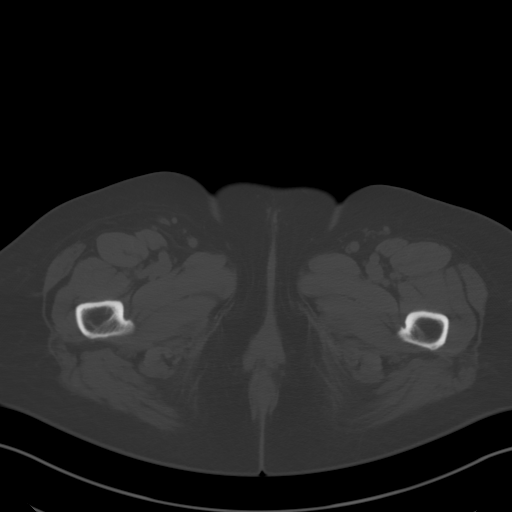
[im 13/96  soft-tissue]
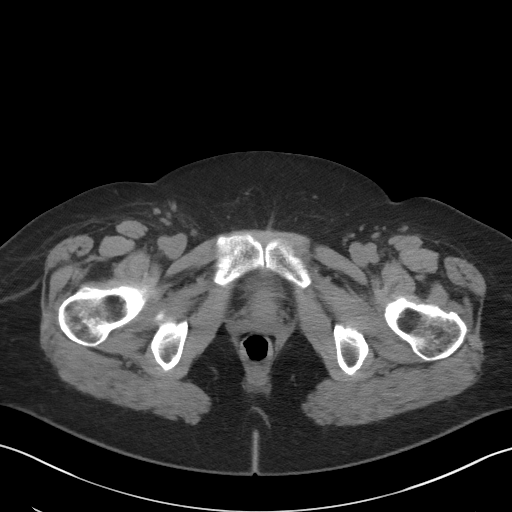
[im 21/96  soft-tissue]
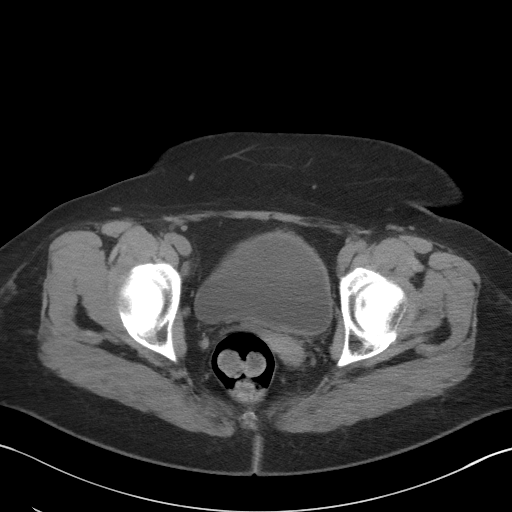
[im 25/96  soft-tissue]
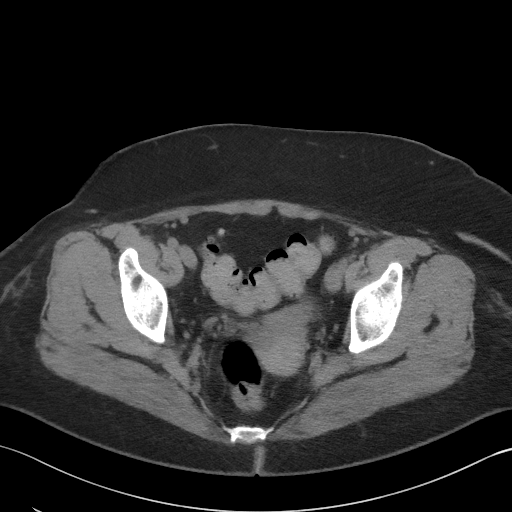
[im 34/96  soft-tissue]
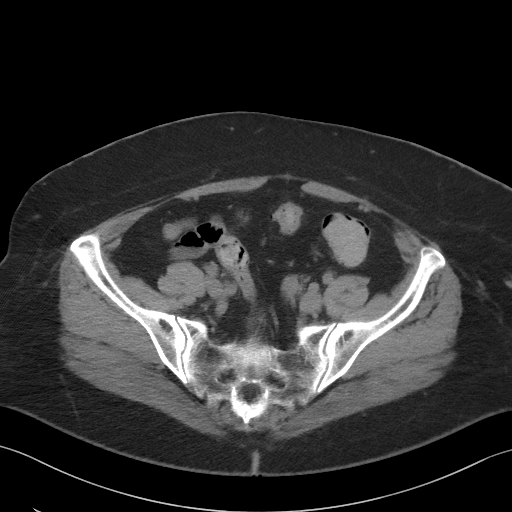
[im 42/96  soft-tissue]
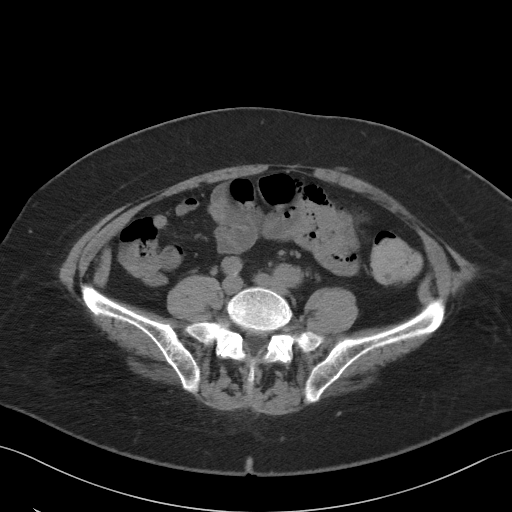
[im 50/96  soft-tissue]
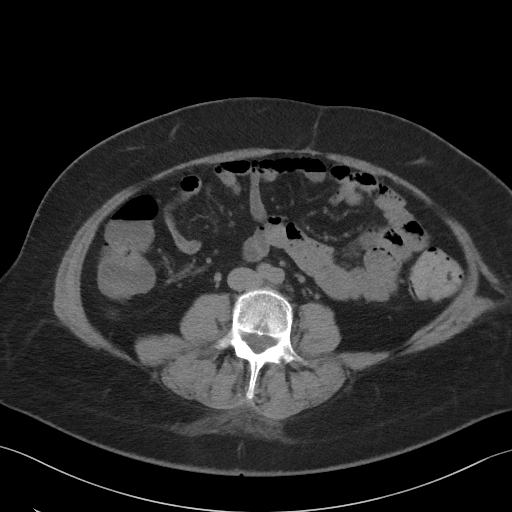
[im 54/96  soft-tissue]
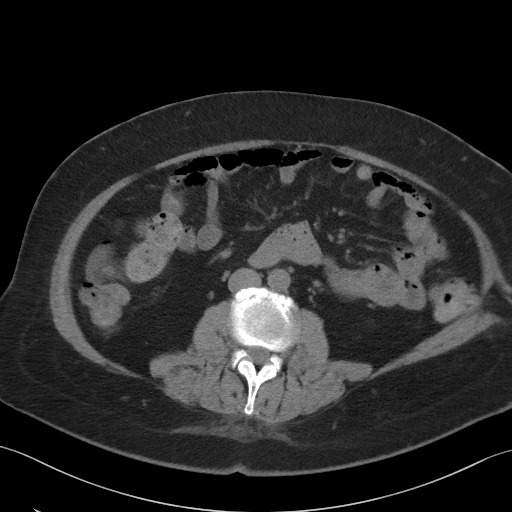
[im 62/96  soft-tissue]
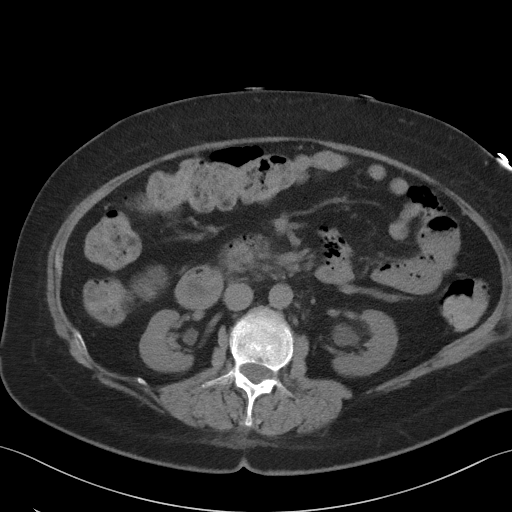
[im 62/96  bone]
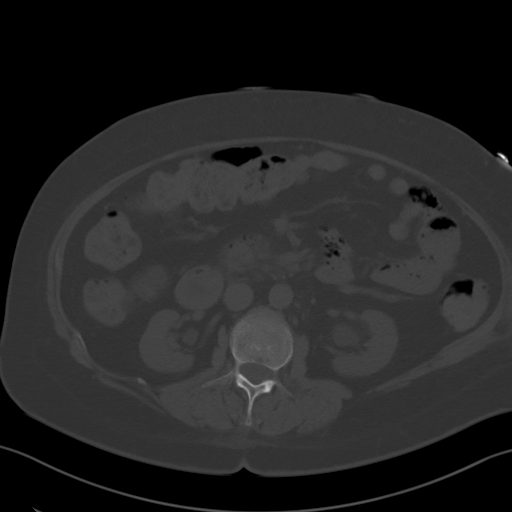
[im 71/96  soft-tissue]
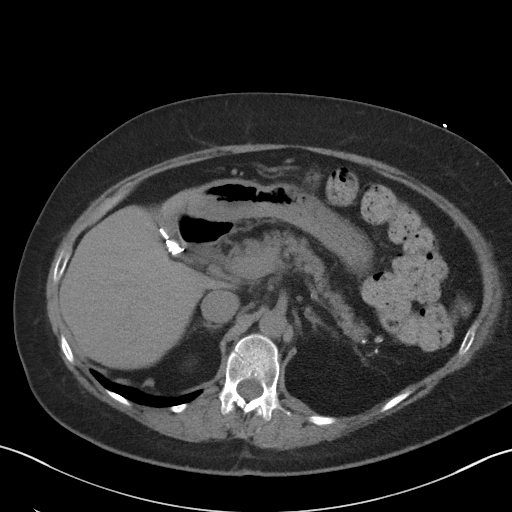
[im 75/96  soft-tissue]
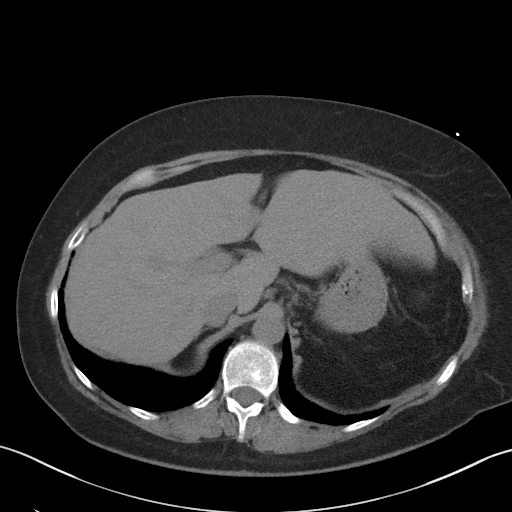
[im 83/96  soft-tissue]
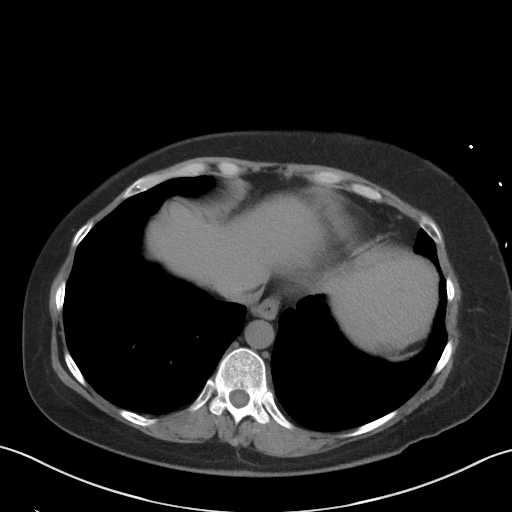
[im 91/96  soft-tissue]
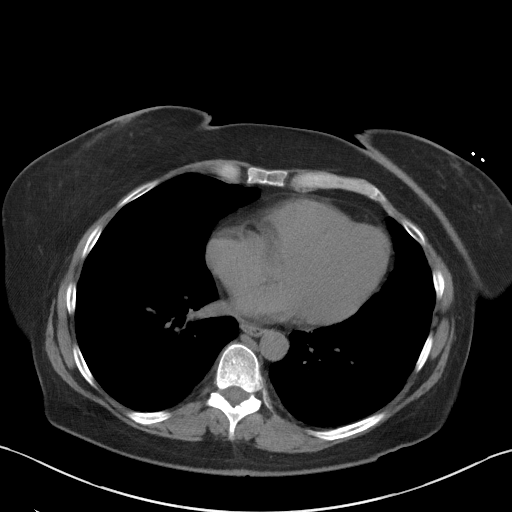

[Series 5: coronal st · coronal · 0.80mm/px · 3 of 97 slices shown]
[im 33/97  soft-tissue]
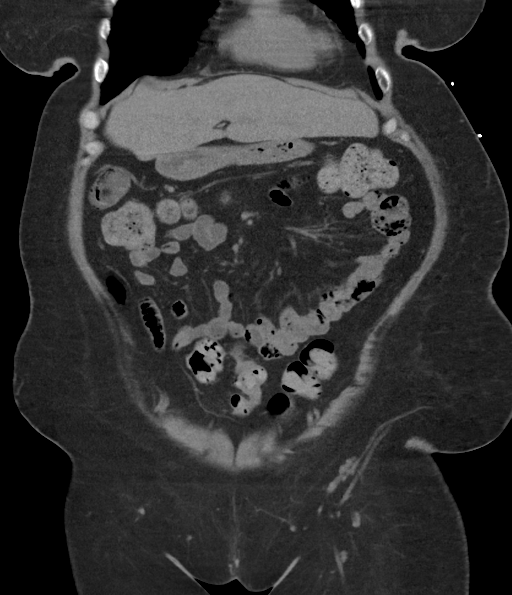
[im 43/97  soft-tissue]
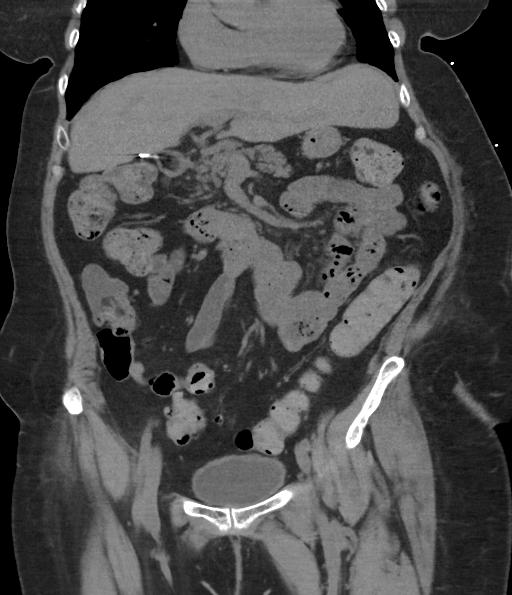
[im 54/97  soft-tissue]
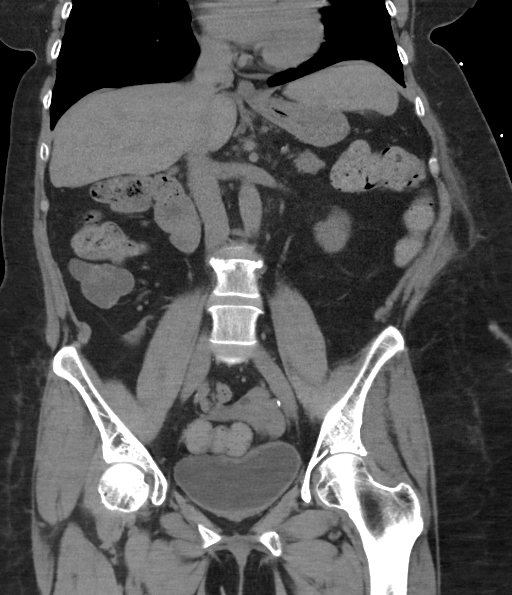

[16 of 46 positions shown; findings below may reference images not displayed]

FINDINGS: Lower chest:  Unremarkable.

Hepatobiliary: Asymmetric enlargement left hepatic lobe, as before.
Subtle nodularity of liver contour. Gallbladder surgically absent.
No intrahepatic or extrahepatic biliary dilation.

Pancreas: No focal mass lesion. No dilatation of the main duct. No
intraparenchymal cyst. No peripancreatic edema.

Spleen: Spleen surgically absent.

Adrenals/Urinary Tract: No adrenal nodule or mass. Kidneys
unremarkable. No evidence for hydroureter. The urinary bladder
appears normal for the degree of distention.

Stomach/Bowel: Stomach is nondistended. No gastric wall thickening.
No evidence of outlet obstruction. Duodenum is normally positioned
as is the ligament of Treitz. No small bowel wall thickening. No
small bowel dilatation. The terminal ileum is normal. The appendix
is normal. Diverticular changes are noted in the left colon without
evidence of diverticulitis.

Vascular/Lymphatic: No abdominal aortic aneurysm. Left common iliac
artery measures up to 1.7 cm diameter. There is no gastrohepatic or
hepatoduodenal ligament lymphadenopathy. No intraperitoneal or
retroperitoneal lymphadenopathy. No pelvic sidewall lymphadenopathy.

Reproductive: The uterus has normal CT imaging appearance. There is
no adnexal mass.

Other: No intraperitoneal free fluid.

Musculoskeletal: Bone windows reveal no worrisome lytic or sclerotic
osseous lesions. Compression deformity at T10 and T12 is stable in
the interval.
IMPRESSION: 1. No acute findings in the abdomen or pelvis.
2. Asymmetric enlargement lateral segment left liver with subtle
nodularity of hepatic contour. This appearance raises the question
of cirrhosis.
3. Status post splenectomy

## 2018-11-20 IMAGING — DX DG CHEST 2V
2 series · 2 of 2 positions shown · non-contrast
Comparison: 06/05/2017

CLINICAL DATA: chest pain that started yesterday with right arm
pain/asthma/htn/ex smoker//hx heart cath

EXAM:
CHEST  2 VIEW

[chest pa]
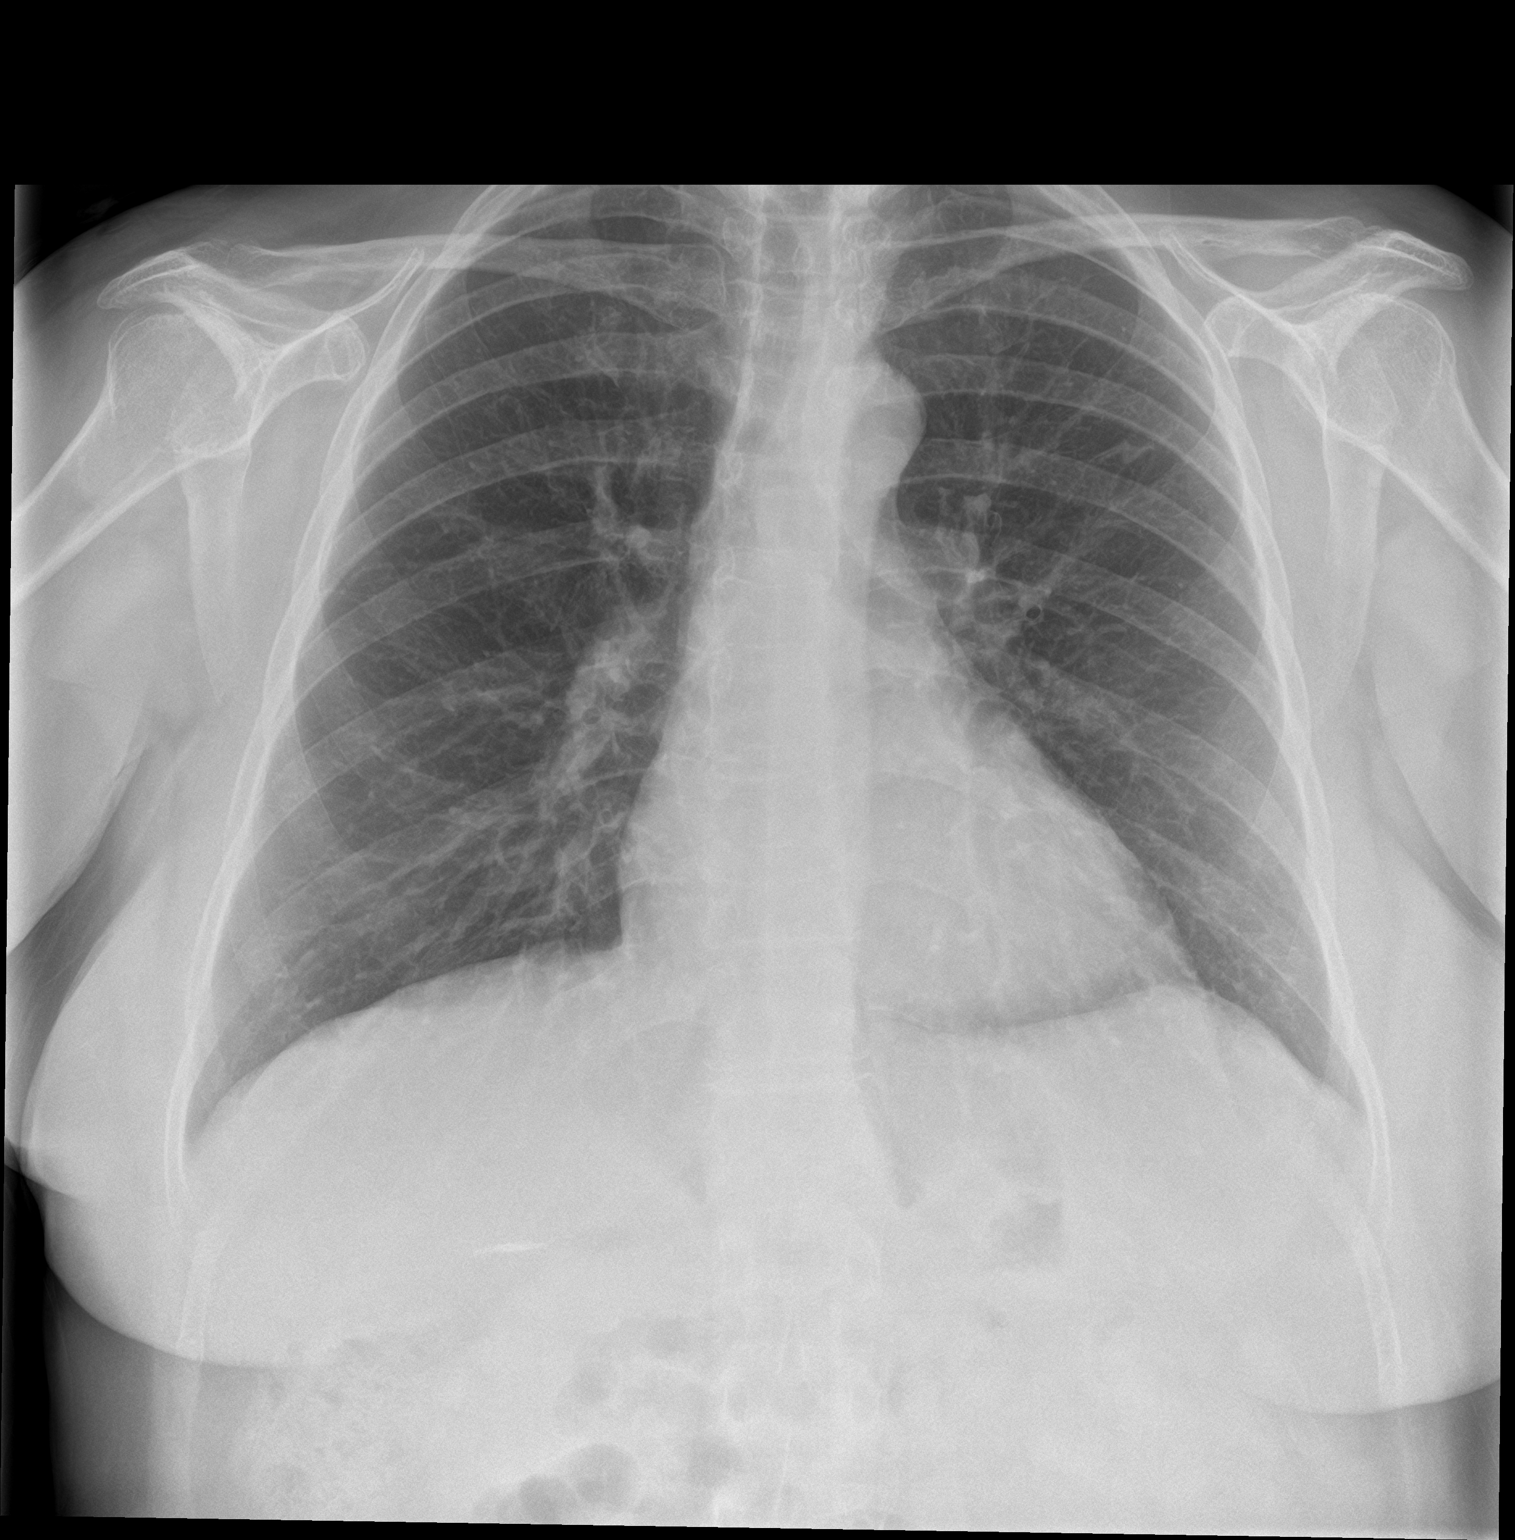

[chest lat]
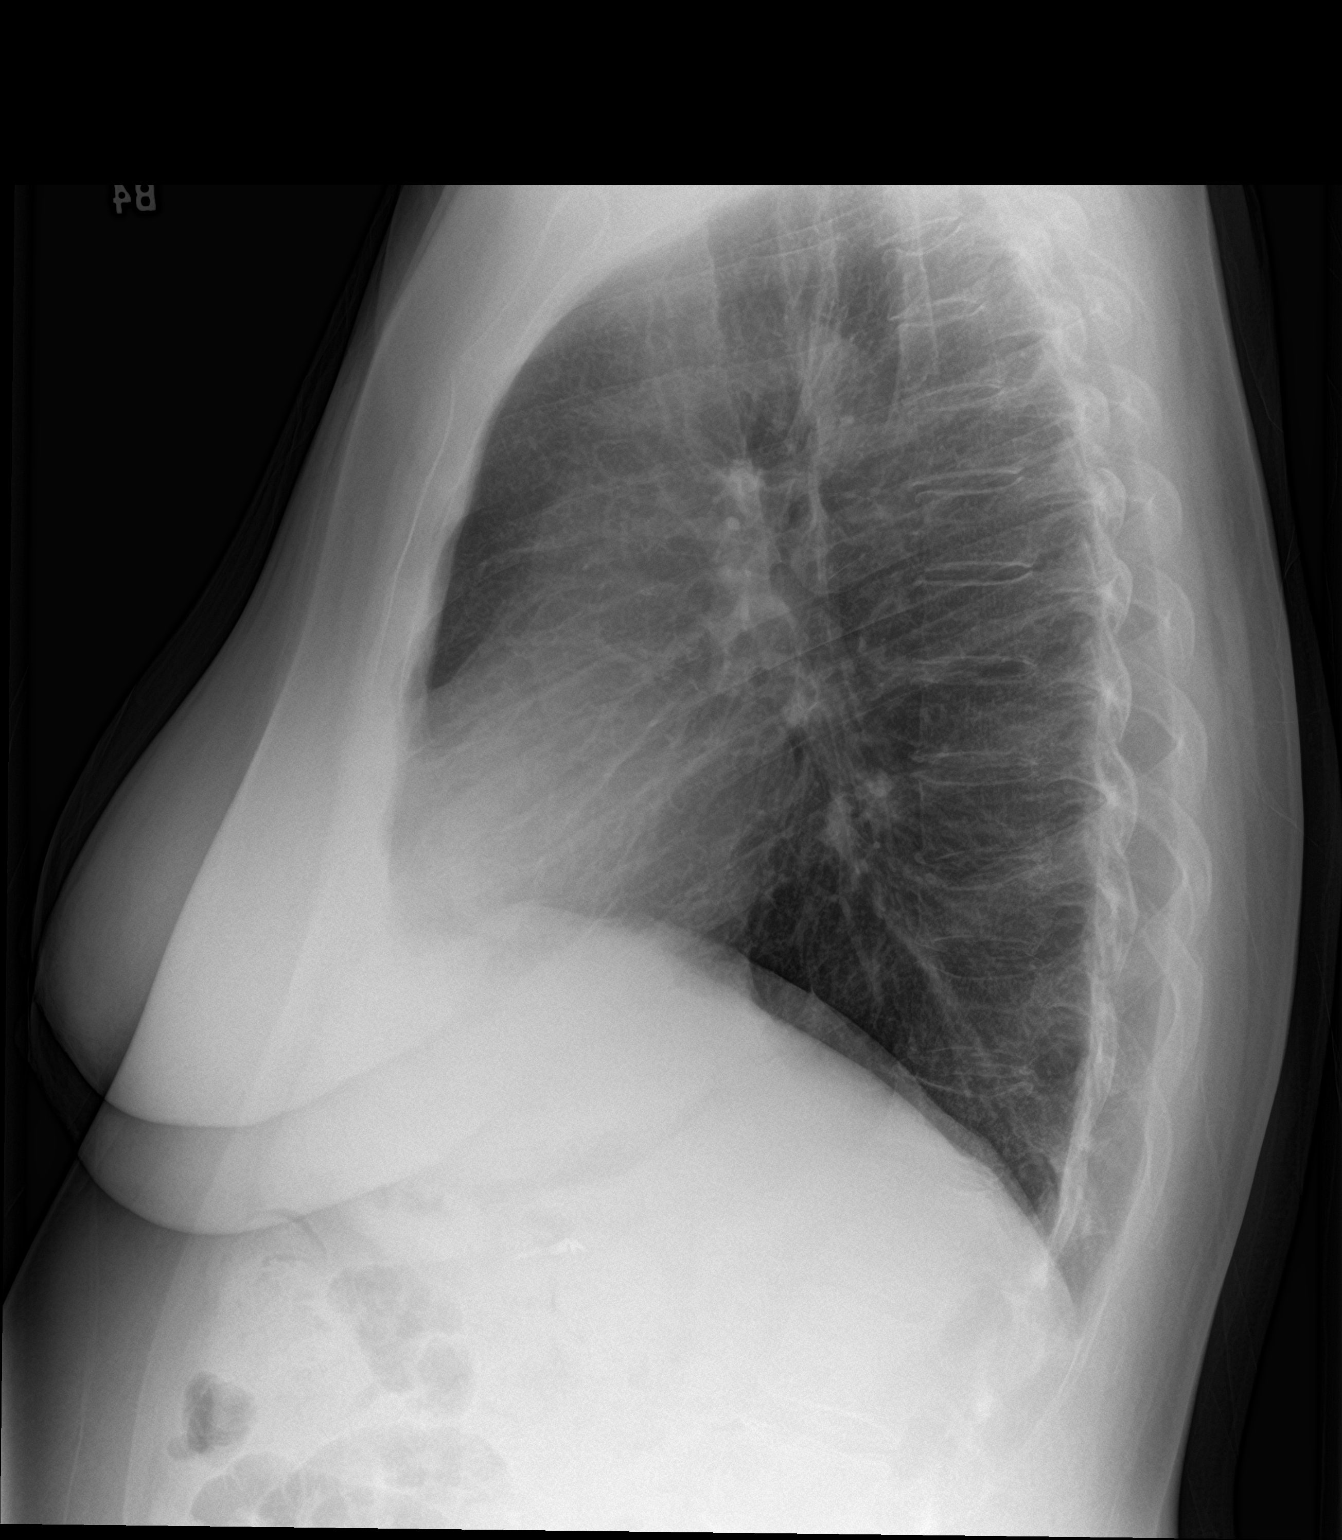

[2 of 2 positions shown; findings below may reference images not displayed]

FINDINGS: The heart size and mediastinal contours are within normal limits.
Both lungs are clear. No pleural effusion or pneumothorax. The
visualized skeletal structures are unremarkable.
IMPRESSION: No active cardiopulmonary disease.

## 2018-11-24 DIAGNOSIS — N184 Chronic kidney disease, stage 4 (severe): Secondary | ICD-10-CM | POA: Diagnosis not present

## 2018-11-24 DIAGNOSIS — E1129 Type 2 diabetes mellitus with other diabetic kidney complication: Secondary | ICD-10-CM | POA: Diagnosis not present

## 2018-11-24 DIAGNOSIS — I10 Essential (primary) hypertension: Secondary | ICD-10-CM | POA: Diagnosis not present

## 2018-12-18 IMAGING — DX DG CHEST 2V
2 series · 2 of 2 positions shown · non-contrast
Comparison: 06/14/2017

CLINICAL DATA: Central chest pain for 2-3 days, stroke,
hypertension, asthma, coronary disease post MI, former smoker, prior
pulmonary embolism

EXAM:
CHEST  2 VIEW

[chest pa]
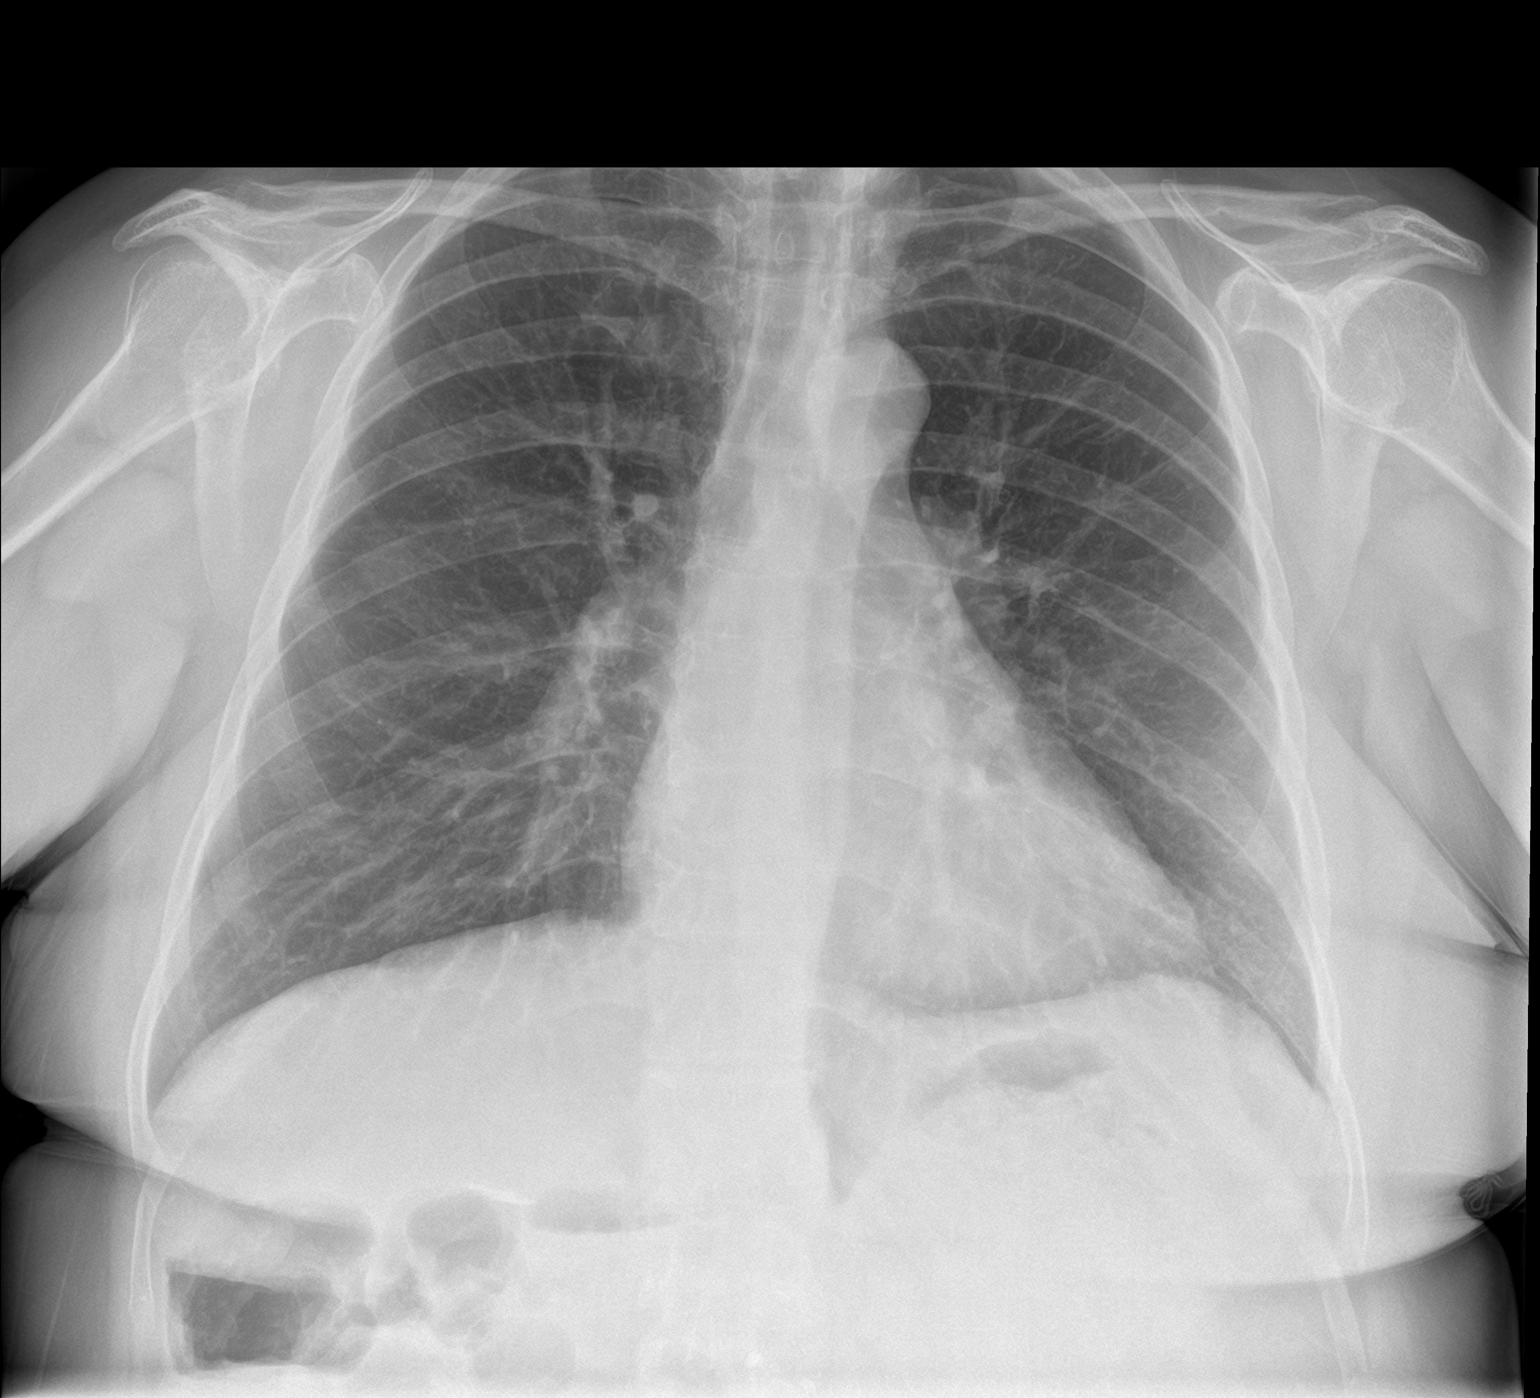

[chest lat]
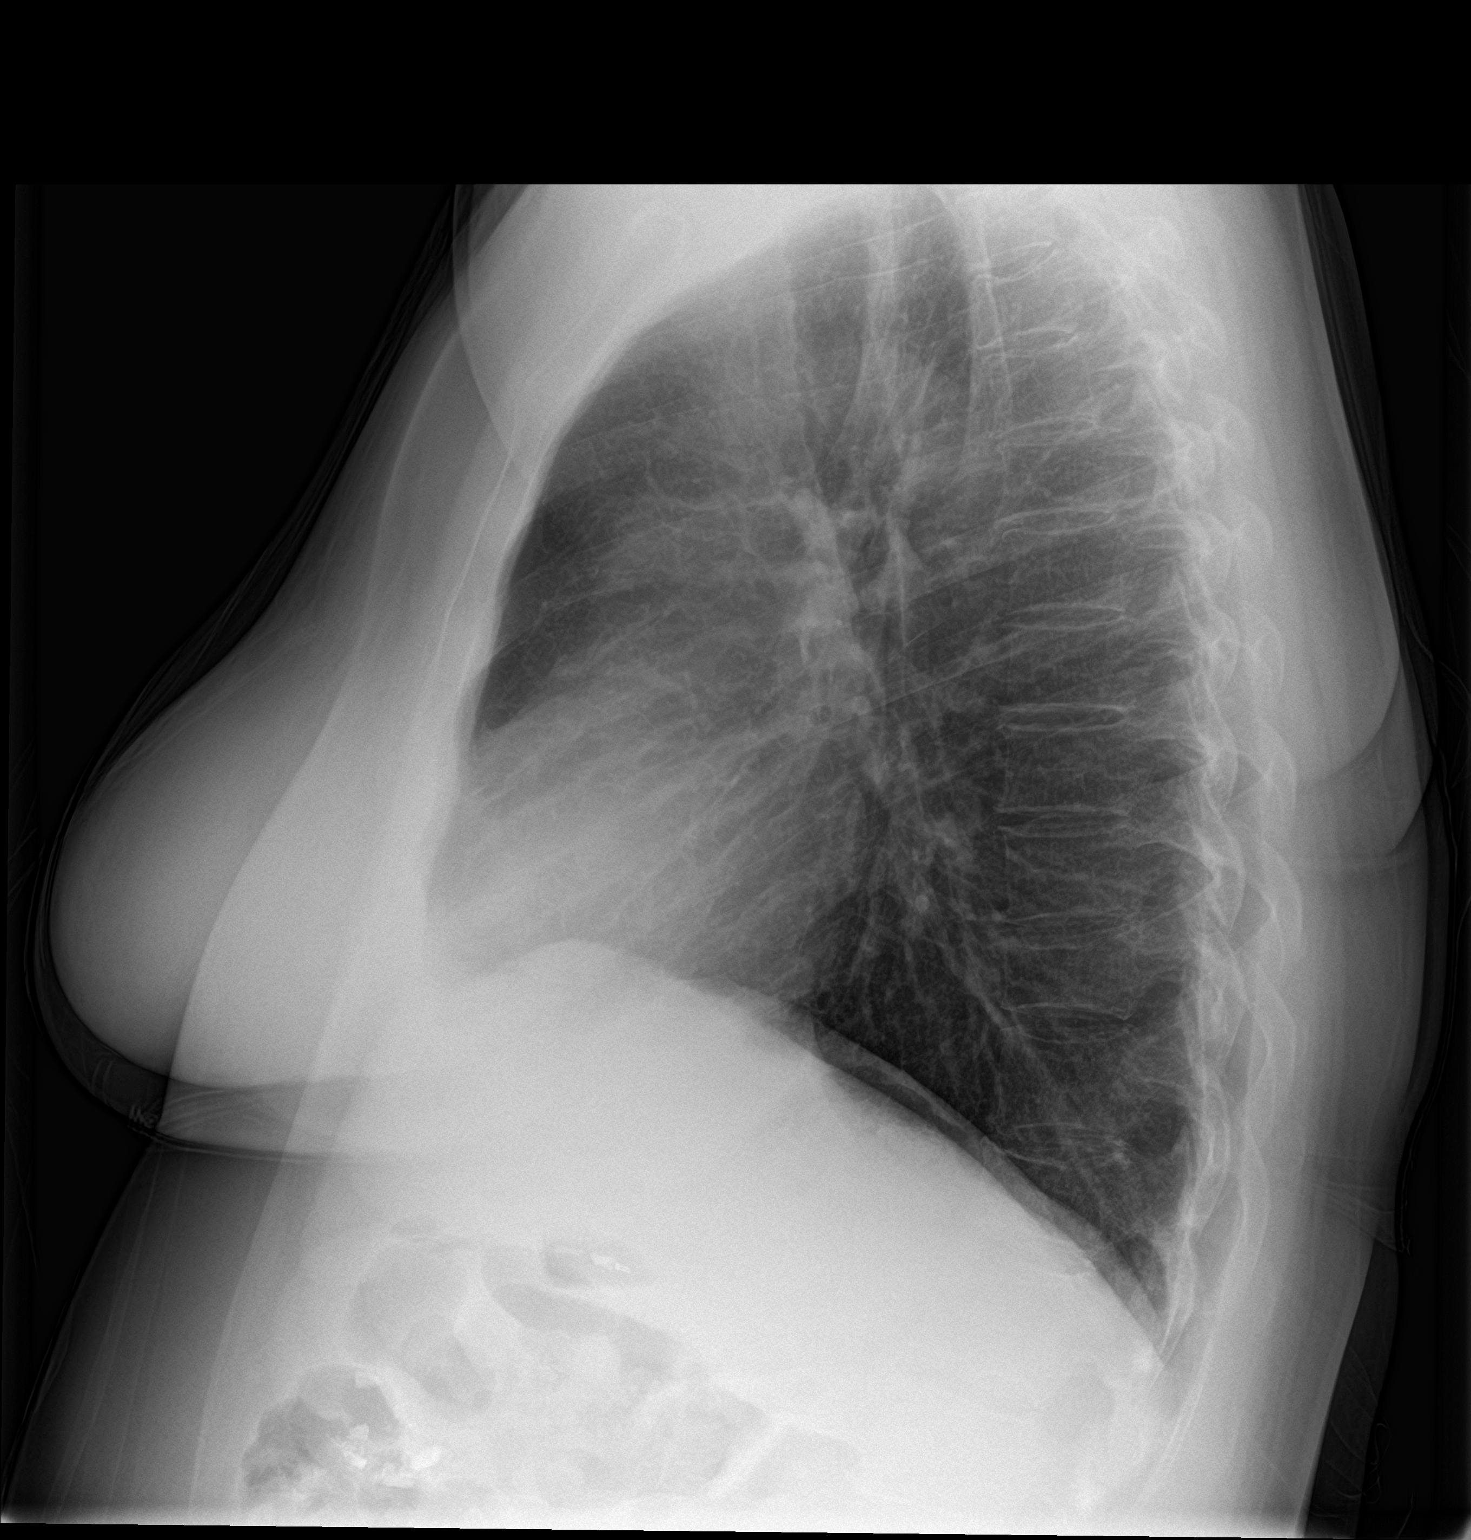

[2 of 2 positions shown; findings below may reference images not displayed]

FINDINGS: Upper-normal size of cardiac silhouette.

Mediastinal contours and pulmonary vascularity normal.

Lungs clear.

No acute infiltrate, pleural effusion or pneumothorax.

Bones demineralized.
IMPRESSION: No acute abnormalities.

## 2018-12-26 DIAGNOSIS — G894 Chronic pain syndrome: Secondary | ICD-10-CM | POA: Diagnosis not present

## 2018-12-26 DIAGNOSIS — N342 Other urethritis: Secondary | ICD-10-CM | POA: Diagnosis not present

## 2018-12-26 DIAGNOSIS — E1129 Type 2 diabetes mellitus with other diabetic kidney complication: Secondary | ICD-10-CM | POA: Diagnosis not present

## 2018-12-30 ENCOUNTER — Other Ambulatory Visit: Payer: Self-pay

## 2018-12-30 ENCOUNTER — Emergency Department (HOSPITAL_COMMUNITY): Payer: Medicare Other

## 2018-12-30 ENCOUNTER — Encounter (HOSPITAL_COMMUNITY): Payer: Self-pay

## 2018-12-30 ENCOUNTER — Inpatient Hospital Stay (HOSPITAL_COMMUNITY)
Admission: EM | Admit: 2018-12-30 | Discharge: 2019-01-04 | DRG: 683 | Disposition: A | Discharge status: Home / Self Care | Payer: Medicare Other | Attending: Internal Medicine | Admitting: Internal Medicine

## 2018-12-30 DIAGNOSIS — R7989 Other specified abnormal findings of blood chemistry: Secondary | ICD-10-CM | POA: Diagnosis not present

## 2018-12-30 DIAGNOSIS — I160 Hypertensive urgency: Secondary | ICD-10-CM | POA: Diagnosis present

## 2018-12-30 DIAGNOSIS — Z86711 Personal history of pulmonary embolism: Secondary | ICD-10-CM

## 2018-12-30 DIAGNOSIS — N183 Chronic kidney disease, stage 3 (moderate): Secondary | ICD-10-CM | POA: Diagnosis present

## 2018-12-30 DIAGNOSIS — Z862 Personal history of diseases of the blood and blood-forming organs and certain disorders involving the immune mechanism: Secondary | ICD-10-CM

## 2018-12-30 DIAGNOSIS — N390 Urinary tract infection, site not specified: Secondary | ICD-10-CM | POA: Diagnosis present

## 2018-12-30 DIAGNOSIS — E872 Acidosis: Secondary | ICD-10-CM | POA: Diagnosis not present

## 2018-12-30 DIAGNOSIS — R319 Hematuria, unspecified: Secondary | ICD-10-CM | POA: Diagnosis not present

## 2018-12-30 DIAGNOSIS — Z9081 Acquired absence of spleen: Secondary | ICD-10-CM

## 2018-12-30 DIAGNOSIS — Z880 Allergy status to penicillin: Secondary | ICD-10-CM | POA: Diagnosis not present

## 2018-12-30 DIAGNOSIS — J45909 Unspecified asthma, uncomplicated: Secondary | ICD-10-CM | POA: Diagnosis not present

## 2018-12-30 DIAGNOSIS — Z8673 Personal history of transient ischemic attack (TIA), and cerebral infarction without residual deficits: Secondary | ICD-10-CM | POA: Diagnosis not present

## 2018-12-30 DIAGNOSIS — R300 Dysuria: Secondary | ICD-10-CM | POA: Diagnosis not present

## 2018-12-30 DIAGNOSIS — Z87891 Personal history of nicotine dependence: Secondary | ICD-10-CM | POA: Diagnosis not present

## 2018-12-30 DIAGNOSIS — N179 Acute kidney failure, unspecified: Secondary | ICD-10-CM | POA: Diagnosis not present

## 2018-12-30 DIAGNOSIS — Z8049 Family history of malignant neoplasm of other genital organs: Secondary | ICD-10-CM

## 2018-12-30 DIAGNOSIS — I2510 Atherosclerotic heart disease of native coronary artery without angina pectoris: Secondary | ICD-10-CM | POA: Diagnosis not present

## 2018-12-30 DIAGNOSIS — R112 Nausea with vomiting, unspecified: Secondary | ICD-10-CM

## 2018-12-30 DIAGNOSIS — M1990 Unspecified osteoarthritis, unspecified site: Secondary | ICD-10-CM | POA: Diagnosis present

## 2018-12-30 DIAGNOSIS — Z801 Family history of malignant neoplasm of trachea, bronchus and lung: Secondary | ICD-10-CM | POA: Diagnosis not present

## 2018-12-30 DIAGNOSIS — I252 Old myocardial infarction: Secondary | ICD-10-CM | POA: Diagnosis not present

## 2018-12-30 DIAGNOSIS — Z7982 Long term (current) use of aspirin: Secondary | ICD-10-CM

## 2018-12-30 DIAGNOSIS — F419 Anxiety disorder, unspecified: Secondary | ICD-10-CM | POA: Diagnosis present

## 2018-12-30 DIAGNOSIS — I129 Hypertensive chronic kidney disease with stage 1 through stage 4 chronic kidney disease, or unspecified chronic kidney disease: Secondary | ICD-10-CM | POA: Diagnosis present

## 2018-12-30 DIAGNOSIS — D649 Anemia, unspecified: Secondary | ICD-10-CM

## 2018-12-30 DIAGNOSIS — K5730 Diverticulosis of large intestine without perforation or abscess without bleeding: Secondary | ICD-10-CM | POA: Diagnosis not present

## 2018-12-30 DIAGNOSIS — F329 Major depressive disorder, single episode, unspecified: Secondary | ICD-10-CM | POA: Diagnosis present

## 2018-12-30 LAB — CBC WITH DIFFERENTIAL/PLATELET
Abs Immature Granulocytes: 0.02 10*3/uL (ref 0.00–0.07)
Basophils Absolute: 0.1 10*3/uL (ref 0.0–0.1)
Basophils Relative: 1 %
Eosinophils Absolute: 0.1 10*3/uL (ref 0.0–0.5)
Eosinophils Relative: 1 %
HCT: 31.8 % — ABNORMAL LOW (ref 36.0–46.0)
Hemoglobin: 10.2 g/dL — ABNORMAL LOW (ref 12.0–15.0)
Immature Granulocytes: 0 %
Lymphocytes Relative: 33 %
Lymphs Abs: 2.5 10*3/uL (ref 0.7–4.0)
MCH: 28.3 pg (ref 26.0–34.0)
MCHC: 32.1 g/dL (ref 30.0–36.0)
MCV: 88.1 fL (ref 80.0–100.0)
Monocytes Absolute: 0.6 10*3/uL (ref 0.1–1.0)
Monocytes Relative: 8 %
Neutro Abs: 4.3 10*3/uL (ref 1.7–7.7)
Neutrophils Relative %: 57 %
Platelets: 154 10*3/uL (ref 150–400)
RBC: 3.61 MIL/uL — ABNORMAL LOW (ref 3.87–5.11)
RDW: 15.8 % — ABNORMAL HIGH (ref 11.5–15.5)
WBC: 7.6 10*3/uL (ref 4.0–10.5)
nRBC: 0 % (ref 0.0–0.2)

## 2018-12-30 LAB — COMPREHENSIVE METABOLIC PANEL
ALT: 12 U/L (ref 0–44)
AST: 18 U/L (ref 15–41)
Albumin: 3.5 g/dL (ref 3.5–5.0)
Alkaline Phosphatase: 64 U/L (ref 38–126)
Anion gap: 14 (ref 5–15)
BUN: 72 mg/dL — ABNORMAL HIGH (ref 6–20)
CO2: 16 mmol/L — ABNORMAL LOW (ref 22–32)
Calcium: 8.7 mg/dL — ABNORMAL LOW (ref 8.9–10.3)
Chloride: 110 mmol/L (ref 98–111)
Creatinine, Ser: 6.88 mg/dL — ABNORMAL HIGH (ref 0.44–1.00)
GFR calc Af Amer: 7 mL/min — ABNORMAL LOW (ref >60)
GFR calc non Af Amer: 6 mL/min — ABNORMAL LOW (ref >60)
Glucose, Bld: 91 mg/dL (ref 70–99)
Potassium: 3.7 mmol/L (ref 3.5–5.1)
Sodium: 140 mmol/L (ref 135–145)
Total Bilirubin: 0.5 mg/dL (ref 0.3–1.2)
Total Protein: 6.8 g/dL (ref 6.5–8.1)

## 2018-12-30 LAB — LIPASE, BLOOD: Lipase: 58 U/L — ABNORMAL HIGH (ref 11–51)

## 2018-12-30 LAB — URINALYSIS, ROUTINE W REFLEX MICROSCOPIC
Bacteria, UA: NONE SEEN
Bilirubin Urine: NEGATIVE
Glucose, UA: NEGATIVE mg/dL
Ketones, ur: 5 mg/dL — AB
Nitrite: NEGATIVE
Protein, ur: 100 mg/dL — AB
Specific Gravity, Urine: 1.01 (ref 1.005–1.030)
pH: 6 (ref 5.0–8.0)

## 2018-12-30 LAB — TROPONIN I: Troponin I: 0.15 ng/mL (ref <0.03)

## 2018-12-30 MED ORDER — PROMETHAZINE HCL 25 MG/ML IJ SOLN
12.5000 mg | Freq: Once | INTRAMUSCULAR | Status: AC
  Administered 2018-12-30: 12.5 mg via INTRAVENOUS
  Filled 2018-12-30: qty 1

## 2018-12-30 MED ORDER — SODIUM CHLORIDE 0.9 % IV BOLUS
1000.0000 mL | Freq: Once | INTRAVENOUS | Status: AC
  Administered 2018-12-31: 1000 mL via INTRAVENOUS

## 2018-12-30 MED ORDER — CIPROFLOXACIN IN D5W 400 MG/200ML IV SOLN
400.0000 mg | Freq: Once | INTRAVENOUS | Status: AC
  Administered 2018-12-31: 400 mg via INTRAVENOUS
  Filled 2018-12-30: qty 200

## 2018-12-30 NOTE — ED Provider Notes (Signed)
[facility] EMERGENCY DEPARTMENT Provider Note   CSN: [id number] Arrival date & time: 12/30/18  2051    History   Chief Complaint Chief Complaint  Patient presents with  . Emesis    recent Dx of UTI    HPI Summer Cook is a 53 y.o. female with a history as outlined below, most significant for hypertension, CAD, GERD, history of ITP and splenectomy, history of PE presenting with a 5-day history of nausea and vomiting.  She was seen by her PCP 5 days ago and diagnosed with a UTI and was placed on Cipro.  Her dysuria symptoms are improved although she reports persistent mid low back pain and also left flank pain.  She reports also having a subjective fever 5 days ago but none since.  She she thought the nausea and vomiting was triggered by the Cipro medication so her PCP added Zofran, however she states his medication has not been helpful.  She has been unable to maintain any p.o. intake and has complaints of increasing weakness.  She denies abdominal pain, no chest pain, she has also had a cough which has been occasionally productive.  She also reports having a headache which started this afternoon.  She was started on diltiazem 4 days ago and reports having similar headaches prior to better blood pressure control.  She denies focal weakness.       HPI  Past Medical History:  Diagnosis Date  . Allergy   . Anxiety   . Asthma   . CAD (coronary artery disease), native coronary artery    a. cath 05/20/17 - 100% distal PDA --> medical mangement (no intervention done)  . Candida esophagitis (Susan Moore)   . Chest pain at rest 04/2017  . Depression   . Essential hypertension, benign   . GERD (gastroesophageal reflux disease)   . Gout   . Hemorrhoids   . 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  Last was Prednisone 59m; 2 wk in 10/2012.  She also was on Prednisone bridged with Cellcept  briefly but stopped due to lack of insurance.   . Myocardial infarction ([place]   . Pulmonary embolism ([place] 04/2012  . Renal insufficiency   . Serrated adenoma of colon   . Steroid-induced diabetes ([place]   . Stroke (HW[place]   . Thrush, oral 05/29/2011    Patient Active Problem List   Diagnosis Date Noted  . Chest pain 04/29/2018  . Unstable angina ([place] 05/29/2017  . CAD (coronary artery disease): total distal RCA EF normal (05/20/17) 05/25/2017  . UTI (urinary tract infection) 05/25/2017  . Chest pain with moderate risk for cardiac etiology 05/23/2017  . Mixed hyperlipidemia   . NSTEMI (non-ST elevated myocardial infarction) ([place] 05/20/2017  . Diabetes mellitus type 2, uncomplicated (HE[place] 085/27/7824 . History of stroke   . Chronic back pain 12/23/2015  . Hypotension 02/16/2015  . Asthma, chronic 02/16/2015  . Anxiety state 02/16/2015  . CKD (chronic kidney disease) stage 3, GFR 30-59 ml/min (HCC) 04/28/2014  . Nausea with vomiting 01/25/2013  . Abnormal LFTs 01/25/2013  . Orthostatic hypotension 01/25/2013  . Rectal bleeding 12/26/2012  . History of pulmonary embolism 05/03/2012  . Hemorrhoids 06/04/2011  . 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 Immunosuppressed status ([place] 06/04/2011  . Depression 06/04/2011  . Obesity 06/04/2011  . Idiopathic thrombocytopenic purpura ([place] 03/18/2011    Past Surgical History:  Procedure Laterality Date  . BONE MARROW BIOPSY    .  CARDIAC CATHETERIZATION    . CARPAL TUNNEL RELEASE    . CARPAL TUNNEL RELEASE Left 05/20/2016   Procedure: CARPAL TUNNEL RELEASE;  Surgeon: Carole Civil, MD;  Location: [facility];  Service: Orthopedics;  Laterality: Left;  . CARPAL TUNNEL RELEASE Right 07/16/2016   Procedure: RIGHT CARPAL TUNNEL RELEASE;  Surgeon: Carole Civil, MD;  Location: [facility];  Service: Orthopedics;  Laterality: Right;  . CATARACT EXTRACTION    . CHOLECYSTECTOMY  2008  . COLONOSCOPY WITH ESOPHAGOGASTRODUODENOSCOPY (EGD) N/A 01/04/2013    Dr. Gala Romney: esophageal plaques with +KOH, hh. Gastric antrum abnormal, bx reactive gastropathy. Anal canal hemorrhoids, colonic tics, normal TI, single polyp (sessile serrated tubular adenoma). Next TCS 12/2017  . LEFT HEART CATH AND CORONARY ANGIOGRAPHY N/A 05/20/2017   Procedure: LEFT HEART CATH AND CORONARY ANGIOGRAPHY;  Surgeon: Martinique, Peter M, MD;  Location: [facility];  Service: Cardiovascular;  Laterality: N/A;  . 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 TEE WITHOUT CARDIOVERSION N/A 01/03/2016   Procedure: TRANSESOPHAGEAL ECHOCARDIOGRAM (TEE) WITH PROPOFOL;  Surgeon: Satira Sark, MD;  Location: [facility];  Service: Cardiovascular;  Laterality: N/A;     OB History    Gravida      Para      Term      Preterm      AB      Living  1     SAB      TAB      Ectopic      Multiple      Live Births               Home Medications    Prior to Admission medications   Medication Sig Start Date End Date Taking? Authorizing Provider  ALPRAZolam Duanne Moron) 0.5 MG tablet Take 1 tablet by mouth 3 (three) times daily.  11/02/17   [provider]  aspirin EC 81 MG EC tablet Take 1 tablet (81 mg total) by mouth daily. 05/23/17   Eileen Stanford, PA-C  azithromycin (ZITHROMAX) 250 MG tablet Take 250-500 mg by mouth See admin instructions. Starting on 06/20/2018 take 532m on day 1, then take 25110mon days 2 through 5 06/20/18   [provider]  diclofenac sodium (VOLTAREN) 1 % GEL Apply 2 g topically 4 (four) times daily. 06/06/17   RiDebbe OdeaMD  DULoxetine (CYMBALTA) 60 MG capsule Take 60 mg by mouth daily. 02/15/18   [provider]  escitalopram (LEXAPRO) 20 MG tablet Take 20 mg by mouth daily.  05/03/17   [provider]  HYDROcodone-acetaminophen (NORCO) 10-325 MG tablet Take 1 tablet by mouth every 4 (four) hours as needed. 11/02/17   [provider]  ipratropium-albuterol (DUONEB) 0.5-2.5 (3) MG/3ML SOLN Take 3 mLs by nebulization 3  (three) times daily as needed (shorntess of breath/wheezing). Patient taking differently: Take 3 mLs by nebulization 3 (three) times daily as needed (shortness of breath/wheezing).  02/18/15   MeKathie DikeMD  isosorbide dinitrate (ISORDIL) 10 MG tablet Take 10 mg by mouth daily.     [provider]  nitroGLYCERIN (NITROSTAT) 0.4 MG SL tablet Place 1 tablet (0.4 mg total) under the tongue every 5 (five) minutes x 3 doses as needed for chest pain. 05/25/17   GrDelos HaringPA-C  Oxycodone HCl 10 MG TABS Take 10 mg by mouth every 4 (four) hours as needed (for pain).  06/03/18   [provider]  PRAnnetta North016190 BASE) MCG/ACT AEPB Inhale 2  puffs into the lungs every 4 (four) hours as needed (Shortness of breath).  12/27/14   [provider]  prochlorperazine (COMPAZINE) 10 MG tablet Take 10 mg by mouth as needed. For nausea   12/18/11  [provider]    Family History Family History  Problem Relation Age of Onset  . Cervical cancer Mother   . Lung cancer Father   . Colon polyps Father        ???  . Pneumonia Brother   . Colon cancer Paternal Grandmother 88  . AAA (abdominal aortic aneurysm) Paternal Grandmother   . Breast cancer Paternal Grandmother   . Colon cancer Paternal Grandfather 63  . Barrett's esophagus Paternal Aunt   . Stomach cancer Neg Hx   . Pancreatic cancer Neg Hx     Social History Social History   Tobacco Use  . Smoking status: Former Smoker    Years: 0.00    Types: Cigarettes    Last attempt to quit: 02/14/2009    Years since quitting: 9.8  . Smokeless tobacco: Never Used  Substance Use Topics  . Alcohol use: No    Alcohol/week: 0.0 standard drinks  . Drug use: No     Allergies   Brintellix [vortioxetine]; Penicillins; Cymbalta [duloxetine hcl]; Yellow jacket venom; Adhesive [tape]; and Zoloft [sertraline hcl]   Review of Systems Review of Systems  Constitutional: Positive for fatigue and fever.  HENT:  Negative.   Eyes: Negative.   Respiratory: Positive for cough. Negative for chest tightness and shortness of breath.   Cardiovascular: Negative for chest pain.  Gastrointestinal: Positive for nausea and vomiting. Negative for abdominal pain and diarrhea.  Genitourinary: Positive for dysuria and flank pain.  Musculoskeletal: Positive for back pain. Negative for arthralgias, joint swelling and neck pain.  Skin: Negative.  Negative for rash and wound.  Neurological: Negative for dizziness, weakness, light-headedness, numbness and headaches.  Psychiatric/Behavioral: Negative.      Physical Exam Updated Vital Signs BP (!) 145/108   Pulse 88   Temp 98.1 F (36.7 C)   Resp 20   Ht _0  (1.651 m)   Wt 83 kg   LMP 08/13/2011   SpO2 97%   BMI 30.45 kg/m   Physical Exam Vitals signs and nursing note reviewed.  Constitutional:      Appearance: She is well-developed.  HENT:     Head: Normocephalic and atraumatic.  Eyes:     Conjunctiva/sclera: Conjunctivae normal.  Neck:     Musculoskeletal: Normal range of motion.  Cardiovascular:     Rate and Rhythm: Normal rate and regular rhythm.     Heart sounds: Normal heart sounds.  Pulmonary:     Effort: Pulmonary effort is normal.     Breath sounds: Normal breath sounds. No wheezing.  Abdominal:     General: Bowel sounds are normal.     Palpations: Abdomen is soft.     Tenderness: There is no abdominal tenderness.  Musculoskeletal: Normal range of motion.  Skin:    General: Skin is warm and dry.  Neurological:     Mental Status: She is alert.      ED Treatments / Results  Labs (all labs ordered are listed, but only abnormal results are displayed) Labs Reviewed  CBC WITH DIFFERENTIAL/PLATELET - Abnormal; Notable for the following components:      Result Value   RBC 3.61 (*)    Hemoglobin 10.2 (*)    HCT 31.8 (*)    RDW 15.8 (*)  All other components within normal limits  COMPREHENSIVE METABOLIC PANEL - Abnormal;  Notable for the following components:   CO2 16 (*)    BUN 72 (*)    Creatinine, Ser 6.88 (*)    Calcium 8.7 (*)    GFR calc non Af Amer 6 (*)    GFR calc Af Amer 7 (*)    All other components within normal limits  LIPASE, BLOOD - Abnormal; Notable for the following components:   Lipase 58 (*)    All other components within normal limits  URINALYSIS, ROUTINE W REFLEX MICROSCOPIC - Abnormal; Notable for the following components:   Color, Urine STRAW (*)    Hgb urine dipstick MODERATE (*)    Ketones, ur 5 (*)    Protein, ur 100 (*)    Leukocytes,Ua TRACE (*)    All other components within normal limits  TROPONIN I - Abnormal; Notable for the following components:   Troponin I 0.15 (*)    All other components within normal limits  POC OCCULT BLOOD, ED    EKG EKG Interpretation  Date/Time:  Friday December 30 2018 22:32:10 EDT Ventricular Rate:  94 PR Interval:    QRS Duration: 140 QT Interval:  410 QTC Calculation: 513 R Axis:   -6 Text Interpretation:  Sinus rhythm IVCD compared with prior 8/19 Confirmed by Aletta Edouard [phone]) on 12/30/2018 10:36:05 PM   Radiology Ct Renal Stone Study  Result Date: 12/31/2018 CLINICAL DATA:  Dysuria. Recent diagnosis of UTI. Fever. Vomiting. EXAM: CT ABDOMEN AND PELVIS WITHOUT CONTRAST TECHNIQUE: Multidetector CT imaging of the abdomen and pelvis was performed following the standard protocol without IV contrast. COMPARISON:  CT scan Feb 12, 2018. FINDINGS: Lower chest: Bilateral dependent atelectasis. The lower chest is otherwise unremarkable. Hepatobiliary: Previous cholecystectomy. The left hepatic lobe is hypertrophied but stable. No liver masses are noted. Pancreas: Unremarkable. No pancreatic ductal dilatation or surrounding inflammatory changes. Spleen: Previous splenectomy. Adrenals/Urinary Tract: Adrenal glands are normal. No renal stones, masses, or hydronephrosis. No ureteral stones. The bladder is unremarkable. Stomach/Bowel: The stomach  and small bowel are normal. Colonic diverticulosis is seen without diverticulitis. The remainder of the colon is normal. The visualized appendix is unremarkable. Vascular/Lymphatic: The abdominal aorta is unremarkable. 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 No adenopathy. Reproductive: Uterus and bilateral adnexa are unremarkable. Other: No abdominal wall hernia or abnormality. No abdominopelvic ascites. Musculoskeletal: Anterior wedging of T10 and T12 is stable. No changes in the visualized bones. IMPRESSION: 1. No cause for the patient's symptoms identified. 2. Colonic diverticulosis without diverticulitis. 3. 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 Electronically Signed   By: Dorise Bullion III M.D   On: 12/31/2018 00:24    Procedures Procedures (including critical care time)  Medications Ordered in ED Medications  ciprofloxacin (CIPRO) IVPB 400 mg (400 mg Intravenous New Bag/Given 12/31/18 0025)  promethazine (PHENERGAN) injection 12.5 mg (12.5 mg Intravenous Given 12/30/18 2242)  sodium chloride 0.9 % bolus 1,000 mL (1,000 mLs Intravenous New Bag/Given 12/31/18 0026)     Initial Impression / Assessment and Plan / ED Course  I have reviewed the triage vital signs and the nursing notes.  Pertinent labs & imaging results that were available during my care of the patient were reviewed by me and considered in my medical decision making (see chart for details).        Pt with recently treated uti  with intractable n/v since being placed on cipro 5 days ago, also started diltiazem for bp with acute renal failure and continued UTI.Marland Kitchen  Headache and persistent hypertension, no focal findings, also no cp.  Troponin elevated, suspected due to renal failure.   Pt given IV fluids, cipro, phenergan with resolution of vomiting. Discussed with Dr. Darrick Meigs who will admit pt. Requested call to nephrology  re consulting here or need to transfer pt to [facility]   Discussed with Dr. Jimmy Footman.  Pt can stay at [facility], will consult pt here.    Final Clinical Impressions(s) / ED Diagnoses   Final diagnoses:  Acute renal failure, unspecified acute renal failure type ([place])  Intractable vomiting with nausea, unspecified vomiting type  Anemia, unspecified type    ED Discharge Orders    None       Landis Martins 12/31/18 0119    Hayden Rasmussen, MD 12/31/18 1057

## 2018-12-30 NOTE — ED Triage Notes (Signed)
Pt reports going to doctor on Monday and being diagnosed with UTI, pt started on Cipro, vomiting started Monday night, Zofran was called in and started on Tuesday, pt reports she has been unable to keep anything down since Tuesday, reports fever around 100 orally at home, in triage oral temp is 98.1. Pt has not had Tylenol since this morning, reports vomiting about 4-5 times today. Pt also reports generalized weakness.

## 2018-12-31 ENCOUNTER — Inpatient Hospital Stay (HOSPITAL_COMMUNITY): Payer: Medicare Other

## 2018-12-31 DIAGNOSIS — Z8673 Personal history of transient ischemic attack (TIA), and cerebral infarction without residual deficits: Secondary | ICD-10-CM | POA: Diagnosis not present

## 2018-12-31 DIAGNOSIS — R112 Nausea with vomiting, unspecified: Secondary | ICD-10-CM

## 2018-12-31 DIAGNOSIS — E872 Acidosis: Secondary | ICD-10-CM | POA: Diagnosis present

## 2018-12-31 DIAGNOSIS — N3289 Other specified disorders of bladder: Secondary | ICD-10-CM | POA: Diagnosis not present

## 2018-12-31 DIAGNOSIS — N179 Acute kidney failure, unspecified: Secondary | ICD-10-CM | POA: Diagnosis not present

## 2018-12-31 DIAGNOSIS — I129 Hypertensive chronic kidney disease with stage 1 through stage 4 chronic kidney disease, or unspecified chronic kidney disease: Secondary | ICD-10-CM | POA: Diagnosis present

## 2018-12-31 DIAGNOSIS — Z7982 Long term (current) use of aspirin: Secondary | ICD-10-CM | POA: Diagnosis not present

## 2018-12-31 DIAGNOSIS — D649 Anemia, unspecified: Secondary | ICD-10-CM | POA: Diagnosis not present

## 2018-12-31 DIAGNOSIS — N2889 Other specified disorders of kidney and ureter: Secondary | ICD-10-CM | POA: Diagnosis not present

## 2018-12-31 DIAGNOSIS — R319 Hematuria, unspecified: Secondary | ICD-10-CM | POA: Diagnosis present

## 2018-12-31 DIAGNOSIS — J45909 Unspecified asthma, uncomplicated: Secondary | ICD-10-CM | POA: Diagnosis present

## 2018-12-31 DIAGNOSIS — F419 Anxiety disorder, unspecified: Secondary | ICD-10-CM | POA: Diagnosis present

## 2018-12-31 DIAGNOSIS — Z880 Allergy status to penicillin: Secondary | ICD-10-CM | POA: Diagnosis not present

## 2018-12-31 DIAGNOSIS — N390 Urinary tract infection, site not specified: Secondary | ICD-10-CM | POA: Diagnosis present

## 2018-12-31 DIAGNOSIS — Z86711 Personal history of pulmonary embolism: Secondary | ICD-10-CM | POA: Diagnosis not present

## 2018-12-31 DIAGNOSIS — R82998 Other abnormal findings in urine: Secondary | ICD-10-CM | POA: Diagnosis not present

## 2018-12-31 DIAGNOSIS — F329 Major depressive disorder, single episode, unspecified: Secondary | ICD-10-CM | POA: Diagnosis present

## 2018-12-31 DIAGNOSIS — Z801 Family history of malignant neoplasm of trachea, bronchus and lung: Secondary | ICD-10-CM | POA: Diagnosis not present

## 2018-12-31 DIAGNOSIS — Z9081 Acquired absence of spleen: Secondary | ICD-10-CM | POA: Diagnosis not present

## 2018-12-31 DIAGNOSIS — N2581 Secondary hyperparathyroidism of renal origin: Secondary | ICD-10-CM | POA: Diagnosis not present

## 2018-12-31 DIAGNOSIS — K5730 Diverticulosis of large intestine without perforation or abscess without bleeding: Secondary | ICD-10-CM | POA: Diagnosis not present

## 2018-12-31 DIAGNOSIS — R300 Dysuria: Secondary | ICD-10-CM | POA: Diagnosis not present

## 2018-12-31 DIAGNOSIS — I252 Old myocardial infarction: Secondary | ICD-10-CM | POA: Diagnosis not present

## 2018-12-31 DIAGNOSIS — Z862 Personal history of diseases of the blood and blood-forming organs and certain disorders involving the immune mechanism: Secondary | ICD-10-CM | POA: Diagnosis not present

## 2018-12-31 DIAGNOSIS — I2510 Atherosclerotic heart disease of native coronary artery without angina pectoris: Secondary | ICD-10-CM | POA: Diagnosis not present

## 2018-12-31 DIAGNOSIS — R7989 Other specified abnormal findings of blood chemistry: Secondary | ICD-10-CM | POA: Diagnosis not present

## 2018-12-31 DIAGNOSIS — N183 Chronic kidney disease, stage 3 (moderate): Secondary | ICD-10-CM

## 2018-12-31 DIAGNOSIS — M1990 Unspecified osteoarthritis, unspecified site: Secondary | ICD-10-CM | POA: Diagnosis present

## 2018-12-31 DIAGNOSIS — Z8049 Family history of malignant neoplasm of other genital organs: Secondary | ICD-10-CM | POA: Diagnosis not present

## 2018-12-31 DIAGNOSIS — Z87891 Personal history of nicotine dependence: Secondary | ICD-10-CM | POA: Diagnosis not present

## 2018-12-31 DIAGNOSIS — I10 Essential (primary) hypertension: Secondary | ICD-10-CM | POA: Diagnosis not present

## 2018-12-31 DIAGNOSIS — I160 Hypertensive urgency: Secondary | ICD-10-CM | POA: Diagnosis present

## 2018-12-31 LAB — IRON AND TIBC
Iron: 49 ug/dL (ref 28–170)
Saturation Ratios: 20 % (ref 10.4–31.8)
TIBC: 242 ug/dL — ABNORMAL LOW (ref 250–450)
UIBC: 193 ug/dL

## 2018-12-31 LAB — CBC
HCT: 29.3 % — ABNORMAL LOW (ref 36.0–46.0)
Hemoglobin: 9.3 g/dL — ABNORMAL LOW (ref 12.0–15.0)
MCH: 27.9 pg (ref 26.0–34.0)
MCHC: 31.7 g/dL (ref 30.0–36.0)
MCV: 88 fL (ref 80.0–100.0)
Platelets: 142 10*3/uL — ABNORMAL LOW (ref 150–400)
RBC: 3.33 MIL/uL — ABNORMAL LOW (ref 3.87–5.11)
RDW: 15.9 % — ABNORMAL HIGH (ref 11.5–15.5)
WBC: 6.7 10*3/uL (ref 4.0–10.5)
nRBC: 0 % (ref 0.0–0.2)

## 2018-12-31 LAB — URIC ACID: Uric Acid, Serum: 6.5 mg/dL (ref 2.5–7.1)

## 2018-12-31 LAB — COMPREHENSIVE METABOLIC PANEL
ALT: 9 U/L (ref 0–44)
AST: 15 U/L (ref 15–41)
Albumin: 3 g/dL — ABNORMAL LOW (ref 3.5–5.0)
Alkaline Phosphatase: 58 U/L (ref 38–126)
Anion gap: 11 (ref 5–15)
BUN: 64 mg/dL — ABNORMAL HIGH (ref 6–20)
CO2: 16 mmol/L — ABNORMAL LOW (ref 22–32)
Calcium: 7.9 mg/dL — ABNORMAL LOW (ref 8.9–10.3)
Chloride: 112 mmol/L — ABNORMAL HIGH (ref 98–111)
Creatinine, Ser: 6.79 mg/dL — ABNORMAL HIGH (ref 0.44–1.00)
GFR calc Af Amer: 7 mL/min — ABNORMAL LOW (ref >60)
GFR calc non Af Amer: 6 mL/min — ABNORMAL LOW (ref >60)
Glucose, Bld: 106 mg/dL — ABNORMAL HIGH (ref 70–99)
Potassium: 3.2 mmol/L — ABNORMAL LOW (ref 3.5–5.1)
Sodium: 139 mmol/L (ref 135–145)
Total Bilirubin: 0.5 mg/dL (ref 0.3–1.2)
Total Protein: 6 g/dL — ABNORMAL LOW (ref 6.5–8.1)

## 2018-12-31 LAB — TROPONIN I
Troponin I: 0.12 ng/mL (ref <0.03)
Troponin I: 0.11 ng/mL (ref <0.03)
Troponin I: 0.1 ng/mL (ref <0.03)

## 2018-12-31 LAB — CREATININE, URINE, RANDOM: Creatinine, Urine: 40.82 mg/dL

## 2018-12-31 LAB — SODIUM, URINE, RANDOM: Sodium, Ur: 74 mmol/L

## 2018-12-31 MED ORDER — ONDANSETRON HCL 4 MG/2ML IJ SOLN
4.0000 mg | Freq: Four times a day (QID) | INTRAMUSCULAR | Status: DC | PRN
  Administered 2018-12-31: 4 mg via INTRAVENOUS
  Filled 2018-12-31: qty 2

## 2018-12-31 MED ORDER — ACETAMINOPHEN 650 MG RE SUPP: 650.0000 mg | Freq: Four times a day (QID) | RECTAL | Status: DC | PRN

## 2018-12-31 MED ORDER — ISOSORBIDE DINITRATE 20 MG PO TABS: 10.0000 mg | ORAL_TABLET | Freq: Every day | ORAL | Status: DC

## 2018-12-31 MED ORDER — ASPIRIN EC 81 MG PO TBEC
81.0000 mg | DELAYED_RELEASE_TABLET | Freq: Every day | ORAL | Status: DC
  Administered 2018-12-31 – 2019-01-01 (×2): 81 mg via ORAL
  Filled 2018-12-31 (×2): qty 1

## 2018-12-31 MED ORDER — FENTANYL CITRATE (PF) 100 MCG/2ML IJ SOLN
INTRAMUSCULAR | Status: AC
  Filled 2018-12-31: qty 2

## 2018-12-31 MED ORDER — HYDRALAZINE HCL 25 MG PO TABS
25.0000 mg | ORAL_TABLET | Freq: Four times a day (QID) | ORAL | Status: DC | PRN
  Administered 2018-12-31: 25 mg via ORAL
  Filled 2018-12-31: qty 1

## 2018-12-31 MED ORDER — ACETAMINOPHEN 325 MG PO TABS
650.0000 mg | ORAL_TABLET | Freq: Four times a day (QID) | ORAL | Status: DC | PRN
  Administered 2018-12-31 – 2019-01-04 (×5): 650 mg via ORAL
  Filled 2018-12-31 (×5): qty 2

## 2018-12-31 MED ORDER — SODIUM CHLORIDE 0.9 % IV SOLN
INTRAVENOUS | Status: DC
  Administered 2018-12-31 – 2019-01-01 (×2): via INTRAVENOUS

## 2018-12-31 MED ORDER — HEPARIN SODIUM (PORCINE) 5000 UNIT/ML IJ SOLN
5000.0000 [IU] | Freq: Three times a day (TID) | INTRAMUSCULAR | Status: DC
  Administered 2018-12-31 – 2019-01-02 (×7): 5000 [IU] via SUBCUTANEOUS
  Filled 2018-12-31 (×7): qty 1

## 2018-12-31 MED ORDER — METOPROLOL TARTRATE 25 MG PO TABS
25.0000 mg | ORAL_TABLET | Freq: Two times a day (BID) | ORAL | Status: DC
  Administered 2018-12-31 – 2019-01-04 (×8): 25 mg via ORAL
  Filled 2018-12-31 (×10): qty 1

## 2018-12-31 MED ORDER — ONDANSETRON HCL 4 MG PO TABS
4.0000 mg | ORAL_TABLET | Freq: Four times a day (QID) | ORAL | Status: DC | PRN
  Administered 2019-01-01: 4 mg via ORAL
  Filled 2018-12-31: qty 1

## 2018-12-31 MED ORDER — ISOSORBIDE DINITRATE 20 MG PO TABS
10.0000 mg | ORAL_TABLET | Freq: Two times a day (BID) | ORAL | Status: DC
  Administered 2018-12-31 – 2019-01-04 (×8): 10 mg via ORAL
  Filled 2018-12-31 (×9): qty 1

## 2018-12-31 MED ORDER — FENTANYL CITRATE (PF) 100 MCG/2ML IJ SOLN
50.0000 ug | Freq: Once | INTRAMUSCULAR | Status: AC
  Administered 2018-12-31: 50 ug via INTRAVENOUS

## 2018-12-31 MED ORDER — SODIUM BICARBONATE 650 MG PO TABS
650.0000 mg | ORAL_TABLET | Freq: Two times a day (BID) | ORAL | Status: DC
  Administered 2018-12-31 – 2019-01-04 (×8): 650 mg via ORAL
  Filled 2018-12-31 (×9): qty 1

## 2018-12-31 MED ORDER — FENTANYL CITRATE (PF) 100 MCG/2ML IJ SOLN
25.0000 ug | INTRAMUSCULAR | Status: DC | PRN
  Administered 2018-12-31 (×2): 25 ug via INTRAVENOUS
  Filled 2018-12-31 (×2): qty 2

## 2018-12-31 NOTE — H&P (Addendum)
TRH H&P    Patient Demographics:    Summer Cook, is a 53 y.o. female  MRN: [id number]  DOB - Aug 31, 1966  Admit Date - 12/30/2018  Referring MD/NP/PA: Evalee Jefferson  Outpatient Primary MD for the patient is Redmond School, MD  Patient coming from: Home  Chief complaint-nausea vomiting   HPI:    Summer Cook  is a 53 y.o. female, with a history of hypertension, CAD, GERD, ITP status post splenectomy, history of PE in 2013, came to hospital with 5-day history of nausea and vomiting.  Patient says that she developed dysuria along with nausea vomiting last Friday and was seen by her PCP on Monday.  She was prescribed ciprofloxacin for UTI.  She has been taking it every day.  Patient also was prescribed diltiazem for hypertension, earlier she was taking amlodipine which did not improve her blood pressure. Patient also was given Zofran by her PCP for nausea vomiting which did not help.  She has experienced worsening fatigue, myalgias.  Complains of chest pain denies abdominal pain.  Also complains of headache, in the ED patient was found to be in hypertensive urgency. Denies previous history of seizures. She had stroke in 2017. Also has a history of CAD, medically managed with aspirin, Imdur.  In the ED, lab work revealed creatinine 6.88, patient's baseline creatinine 1.57. Also found to have elevated troponin 0 0.15.    Review of systems:    In addition to the HPI above,    All other systems reviewed and are negative.    Past History of the following :    Past Medical History:  Diagnosis Date  . Allergy   . Anxiety   . Asthma   . CAD (coronary artery disease), native coronary artery    a. cath 05/20/17 - 100% distal PDA --> medical mangement (no intervention done)  . Candida esophagitis ([place])   . Chest pain at rest 04/2017  . Depression   . Essential hypertension, benign   . GERD (gastroesophageal  reflux disease)   . Gout   . Hemorrhoids   . 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  Last was Prednisone 14m; 2 wk in 10/2012.  She also was on Prednisone bridged with Cellcept briefly but stopped due to lack of insurance.   . Myocardial infarction ([facility]   . Pulmonary embolism ([facility] 04/2012  . Renal insufficiency   . Serrated adenoma of colon   . 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 Stroke ([facility]   . Thrush, oral 05/29/2011      Past Surgical History:  Procedure Laterality Date  . BONE MARROW BIOPSY    . CARDIAC CATHETERIZATION    . CARPAL TUNNEL RELEASE    . CARPAL TUNNEL RELEASE Left 05/20/2016   Procedure: CARPAL TUNNEL RELEASE;  Surgeon: SCarole Civil MD;  Location: [facility];  Service: Orthopedics;  Laterality: Left;  . CARPAL TUNNEL RELEASE Right 07/16/2016   Procedure: RIGHT CARPAL TUNNEL RELEASE;  Surgeon: STim Lair  Aline Brochure, MD;  Location: [facility];  Service: Orthopedics;  Laterality: Right;  . CATARACT EXTRACTION    . CHOLECYSTECTOMY  2008  . COLONOSCOPY WITH ESOPHAGOGASTRODUODENOSCOPY (EGD) N/A 01/04/2013   Dr. Gala Romney: esophageal plaques with +KOH, hh. Gastric antrum abnormal, bx reactive gastropathy. Anal canal hemorrhoids, colonic tics, normal TI, single polyp (sessile serrated tubular adenoma). Next TCS 12/2017  . LEFT HEART CATH AND CORONARY ANGIOGRAPHY N/A 05/20/2017   Procedure: LEFT HEART CATH AND CORONARY ANGIOGRAPHY;  Surgeon: Martinique, Peter M, MD;  Location: [facility];  Service: Cardiovascular;  Laterality: N/A;  . 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 TEE WITHOUT CARDIOVERSION N/A 01/03/2016   Procedure: TRANSESOPHAGEAL ECHOCARDIOGRAM (TEE) WITH PROPOFOL;  Surgeon: Satira Sark, MD;  Location: [facility];  Service: Cardiovascular;  Laterality: N/A;      Social History:      Social History   Tobacco Use  . Smoking status: Former Smoker    Years:  0.00    Types: Cigarettes    Last attempt to quit: 02/14/2009    Years since quitting: 9.8  . Smokeless tobacco: Never Used  Substance Use Topics  . Alcohol use: No    Alcohol/week: 0.0 standard drinks       Family History :     Family History  Problem Relation Age of Onset  . Cervical cancer Mother   . Lung cancer Father   . Colon polyps Father        ???  . Pneumonia Brother   . Colon cancer Paternal Grandmother 88  . AAA (abdominal aortic aneurysm) Paternal Grandmother   . Breast cancer Paternal Grandmother   . Colon cancer Paternal Grandfather 74  . Barrett's esophagus Paternal Aunt   . Stomach cancer Neg Hx   . Pancreatic cancer Neg Hx       Home Medications:   Prior to Admission medications   Medication Sig Start Date End Date Taking? Authorizing Provider  ALPRAZolam Duanne Moron) 0.5 MG tablet Take 1 tablet by mouth 3 (three) times daily.  11/02/17   [provider]  aspirin EC 81 MG EC tablet Take 1 tablet (81 mg total) by mouth daily. 05/23/17   Eileen Stanford, PA-C  azithromycin (ZITHROMAX) 250 MG tablet Take 250-500 mg by mouth See admin instructions. Starting on 06/20/2018 take 532m on day 1, then take 2538mon days 2 through 5 06/20/18   [provider]  diclofenac sodium (VOLTAREN) 1 % GEL Apply 2 g topically 4 (four) times daily. 06/06/17   RiDebbe OdeaMD  DULoxetine (CYMBALTA) 60 MG capsule Take 60 mg by mouth daily. 02/15/18   [provider]  escitalopram (LEXAPRO) 20 MG tablet Take 20 mg by mouth daily.  05/03/17   [provider]  HYDROcodone-acetaminophen (NORCO) 10-325 MG tablet Take 1 tablet by mouth every 4 (four) hours as needed. 11/02/17   [provider]  ipratropium-albuterol (DUONEB) 0.5-2.5 (3) MG/3ML SOLN Take 3 mLs by nebulization 3 (three) times daily as needed (shorntess of breath/wheezing). Patient taking differently: Take 3 mLs by nebulization 3 (three) times daily as needed (shortness of  breath/wheezing).  02/18/15   MeKathie DikeMD  isosorbide dinitrate (ISORDIL) 10 MG tablet Take 10 mg by mouth daily.     [provider]  nitroGLYCERIN (NITROSTAT) 0.4 MG SL tablet Place 1 tablet (0.4 mg total) under the tongue every 5 (five) minutes x 3 doses as needed for chest pain. 05/25/17   GrDelos HaringPA-C  Oxycodone HCl  10 MG TABS Take 10 mg by mouth every 4 (four) hours as needed (for pain).  06/03/18   [provider]  PROAIR RESPICLICK 702 (90 BASE) MCG/ACT AEPB Inhale 2 puffs into the lungs every 4 (four) hours as needed (Shortness of breath).  12/27/14   [provider]  prochlorperazine (COMPAZINE) 10 MG tablet Take 10 mg by mouth as needed. For nausea   12/18/11  [provider]     Allergies:     Allergies  Allergen Reactions  . Brintellix [Vortioxetine] Itching  . Penicillins Itching  . Cymbalta [Duloxetine Hcl] Itching  . [place] Venom Swelling  . Adhesive [Tape] Rash    Please use paper tape  . Zoloft [Sertraline Hcl] Diarrhea     Physical Exam:   Vitals  Blood pressure (S) (!) 171/116, pulse 80, temperature 98.1 F (36.7 C), resp. rate (!) 21, height _0  (1.651 m), weight 83 kg, last menstrual period 08/13/2011, SpO2 97 %.  1.  General: Appears in no acute distress  2. Psychiatric: Alert, oriented x3, intact insight and judgment  3. Neurologic: Cranial nerve II through grossly intact, motor strength 5/5 in all extremities  4. HEENMT:  Atraumatic normocephalic, extraocular still intact  5. Respiratory : Clear to auscultation bilaterally, no wheezing or crackles.  6. Cardiovascular : S1-S2, regular, no murmur auscultated.  7. Gastrointestinal:  Abdomen is soft, positive left CVA tenderness to palpation  8. Skin:  No rashes noted      Data Review:    CBC Recent Labs  Lab 12/30/18 2223  WBC 7.6  HGB 10.2*  HCT 31.8*  PLT 154  MCV 88.1  MCH 28.3  MCHC 32.1  RDW 15.8*  LYMPHSABS 2.5   MONOABS 0.6  EOSABS 0.1  BASOSABS 0.1   ------------------------------------------------------------------------------------------------------------------  Results for orders placed or performed during the hospital encounter of 12/30/18 (from the past 48 hour(s))  CBC with Differential     Status: Abnormal   Collection Time: 12/30/18 10:23 PM  Result Value Ref Range   WBC 7.6 4.0 - 10.5 K/uL   RBC 3.61 (L) 3.87 - 5.11 MIL/uL   Hemoglobin 10.2 (L) 12.0 - 15.0 g/dL   HCT 31.8 (L) 36.0 - 46.0 %   MCV 88.1 80.0 - 100.0 fL   MCH 28.3 26.0 - 34.0 pg   MCHC 32.1 30.0 - 36.0 g/dL   RDW 15.8 (H) 11.5 - 15.5 %   Platelets 154 150 - 400 K/uL   nRBC 0.0 0.0 - 0.2 %   Neutrophils Relative % 57 %   Neutro Abs 4.3 1.7 - 7.7 K/uL   Lymphocytes Relative 33 %   Lymphs Abs 2.5 0.7 - 4.0 K/uL   Monocytes Relative 8 %   Monocytes Absolute 0.6 0.1 - 1.0 K/uL   Eosinophils Relative 1 %   Eosinophils Absolute 0.1 0.0 - 0.5 K/uL   Basophils Relative 1 %   Basophils Absolute 0.1 0.0 - 0.1 K/uL   Immature Granulocytes 0 %   Abs Immature Granulocytes 0.02 0.00 - 0.07 K/uL    Comment: Performed at [facility], [address] [place], [place] [address]  Comprehensive metabolic panel     Status: Abnormal   Collection Time: 12/30/18 10:23 PM  Result Value Ref Range   Sodium 140 135 - 145 mmol/L   Potassium 3.7 3.5 - 5.1 mmol/L   Chloride 110 98 - 111 mmol/L   CO2 16 (L) 22 - 32 mmol/L   Glucose, Bld 91  70 - 99 mg/dL   BUN 72 (H) 6 - 20 mg/dL   Creatinine, Ser 6.88 (H) 0.44 - 1.00 mg/dL   Calcium 8.7 (L) 8.9 - 10.3 mg/dL   Total Protein 6.8 6.5 - 8.1 g/dL   Albumin 3.5 3.5 - 5.0 g/dL   AST 18 15 - 41 U/L   ALT 12 0 - 44 U/L   Alkaline Phosphatase 64 38 - 126 U/L   Total Bilirubin 0.5 0.3 - 1.2 mg/dL   GFR calc non Af Amer 6 (L) >60 mL/min   GFR calc Af Amer 7 (L) >60 mL/min   Anion gap 14 5 - 15    Comment: Performed at [facility], [address], [place], [place] [address]  Lipase, blood      Status: Abnormal   Collection Time: 12/30/18 10:23 PM  Result Value Ref Range   Lipase 58 (H) 11 - 51 U/L    Comment: Performed at [facility], [address] [place], [place] [address]  Troponin I - ONCE - STAT     Status: Abnormal   Collection Time: 12/30/18 10:23 PM  Result Value Ref Range   Troponin I 0.15 (HH) <0.03 ng/mL    Comment: CRITICAL RESULT CALLED TO, READ BACK BY AND VERIFIED WITH: MCKINNEY,A @ 0263 ON 12/30/18 BY JUW Performed at [facility], [address] [place], [place] [address]   Urinalysis, Routine w reflex microscopic     Status: Abnormal   Collection Time: 12/30/18 10:30 PM  Result Value Ref Range   Color, Urine STRAW (A) YELLOW   APPearance CLEAR CLEAR   Specific Gravity, Urine 1.010 1.005 - 1.030   pH 6.0 5.0 - 8.0   Glucose, UA NEGATIVE NEGATIVE mg/dL   Hgb urine dipstick MODERATE (A) NEGATIVE   Bilirubin Urine NEGATIVE NEGATIVE   Ketones, ur 5 (A) NEGATIVE mg/dL   Protein, ur 100 (A) NEGATIVE mg/dL   Nitrite NEGATIVE NEGATIVE   Leukocytes,Ua TRACE (A) NEGATIVE   RBC / HPF 21-50 0 - 5 RBC/hpf   WBC, UA 21-50 0 - 5 WBC/hpf   Bacteria, UA NONE SEEN NONE SEEN   Squamous Epithelial / LPF 0-5 0 - 5    Comment: Performed at [facility], [address] [place], [place] [address]    Chemistries  Recent Labs  Lab 12/30/18 2223  NA 140  K 3.7  CL 110  CO2 16*  GLUCOSE 91  BUN 72*  CREATININE 6.88*  CALCIUM 8.7*  AST 18  ALT 12  ALKPHOS 64  BILITOT 0.5   ------------------------------------------------------------------------------------------------------------------  ------------------------------------------------------------------------------------------------------------------ GFR: Estimated Creatinine Clearance: 10.1 mL/min (A) (by C-G formula based on SCr of 6.88 mg/dL (H)). Liver Function Tests: Recent Labs  Lab 12/30/18 2223  AST 18  ALT 12  ALKPHOS 64  BILITOT 0.5  PROT 6.8  ALBUMIN 3.5   Recent Labs  Lab 12/30/18 2223   LIPASE 58*   No results for input(s): AMMONIA in the last 168 hours. Coagulation Profile: No results for input(s): INR, PROTIME in the last 168 hours. Cardiac Enzymes: Recent Labs  Lab 12/30/18 2223  TROPONINI 0.15*    --------------------------------------------------------------------------------------------------------------- Urine analysis:    Component Value Date/Time   COLORURINE STRAW (A) 12/30/2018 2230   APPEARANCEUR CLEAR 12/30/2018 2230   LABSPEC 1.010 12/30/2018 2230   PHURINE 6.0 12/30/2018 2230   GLUCOSEU NEGATIVE 12/30/2018 2230   HGBUR MODERATE (A) 12/30/2018 2230   BILIRUBINUR NEGATIVE 12/30/2018 2230   KETONESUR 5 (A) 12/30/2018 2230  PROTEINUR 100 (A) 12/30/2018 2230   UROBILINOGEN 0.2 02/17/2015 1325   NITRITE NEGATIVE 12/30/2018 2230   LEUKOCYTESUR TRACE (A) 12/30/2018 2230      Imaging Results:    Ct Renal Stone Study  Result Date: 12/31/2018 CLINICAL DATA:  Dysuria. Recent diagnosis of UTI. Fever. Vomiting. EXAM: CT ABDOMEN AND PELVIS WITHOUT CONTRAST TECHNIQUE: Multidetector CT imaging of the abdomen and pelvis was performed following the standard protocol without IV contrast. COMPARISON:  CT scan Feb 12, 2018. FINDINGS: Lower chest: Bilateral dependent atelectasis. The lower chest is otherwise unremarkable. Hepatobiliary: Previous cholecystectomy. The left hepatic lobe is hypertrophied but stable. No liver masses are noted. Pancreas: Unremarkable. No pancreatic ductal dilatation or surrounding inflammatory changes. Spleen: Previous splenectomy. Adrenals/Urinary Tract: Adrenal glands are normal. No renal stones, masses, or hydronephrosis. No ureteral stones. The bladder is unremarkable. Stomach/Bowel: The stomach and small bowel are normal. Colonic diverticulosis is seen without diverticulitis. The remainder of the colon is normal. The visualized appendix is unremarkable. Vascular/Lymphatic: The abdominal aorta is unremarkable. There is aneurysmal  dilatation of the right common iliac artery measuring 1.8 cm, unchanged. No adenopathy. Reproductive: Uterus and bilateral adnexa are unremarkable. Other: No abdominal wall hernia or abnormality. No abdominopelvic ascites. Musculoskeletal: Anterior wedging of T10 and T12 is stable. No changes in the visualized bones. IMPRESSION: 1. No cause for the patient's symptoms identified. 2. Colonic diverticulosis without diverticulitis. 3. 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 Electronically Signed   By: Dorise Bullion III M.D   On: 12/31/2018 00:24    My personal review of EKG: Rhythm NSR   Assessment & Plan:    Active Problems:   AKI (acute kidney injury) ([place])   1. Acute kidney injury-likely from poor p.o. intake, intractable nausea vomiting, ciprofloxacin induced renal injury.  Will check renal ultrasound in a.m., urine sodium, urine creatinine.  Start normal saline at 100 mill per hour.  Patient is still making urine, nonoliguric renal failure.  Will consult nephrology in a.m.  Strict intake and output.  2. Hypertensive urgency-patient blood pressure mildly elevated, she was started on diltiazem and blood pressure is still elevated.  Will start metoprolol 25 mg p.o. twice daily, hydralazine 25 mg every 6 hours as needed for BP greater than 160/100.  3. Troponin elevation-patient has mild elevation of troponin  0.15, also complained of chest pain which is currently resolved.  Troponin could be admitted from acute kidney injury.  Will cycle troponin every 6 hours x3.  EKG shows no ST changes.  4. CAD-continue Imdur, aspirin  5. Intractable nausea vomiting-improved, will start Zofran PRN for nausea vomiting.  Will start on clear liquid diet and advance as tolerated.  6. Recent UTI-patient was treated with ciprofloxacin for UTI.  She has been taking ciprofloxacin for past 5 days.  UA shows leukocytes.  Will obtain urine culture.  Would not  treat with antibiotics at this time.  Patient seems to be adequately treated with no symptoms of UTI at this time.  CT abdomen pelvis is negative for pyonephritis.   DVT Prophylaxis-   Heparin  AM Labs Ordered, also please review Full Orders  Family Communication: Admission, patients condition and plan of care including tests being ordered have been discussed with the patient  who indicate understanding and agree with the plan and Code Status.  Code Status: Full code  Admission status: Inpatient: Based on patients clinical presentation and evaluation of above clinical data, I have made  determination that patient meets Inpatient criteria at this time.  Time spent in minutes : 60 minutes   Oswald Hillock M.D on 12/31/2018 at 2:05 AM

## 2018-12-31 NOTE — Progress Notes (Signed)
Patient seen and examined. Admitted after midnight with complaints of general malaise, intractable nausea/vomtiing and dehydration. Recent UTI treated as an outpatient and new acute on chronic renal failure. Patient with metabolic acidosis from acute component of renal failure and Cr of 6.88. hemodynamically stable currently. On presentation she was experiencing hypertensive urgency. Please refer to H&P written by Dr. Darrick Meigs for further info/details on admission.  Plan: -continue IVF's -follow renal US -follow urine sodium, Cr and osmolality  -will follow nephrology recommendations -follow renal function trend  -continue PRN antiemetics, start sodium bicarbonate and advance diet as tolerated.  Barton Dubois MD [phone]

## 2018-12-31 NOTE — Consult Note (Signed)
Reason for Consult:AKI Referring Physician: Dr. Lavonda Jumbo is an 53 y.o. female.  HPI: 53 yr female with extensive PMH including ITP, Gout, MI x 2 ,CAD, PE, obesity, steroid induced DM, CVA, GERD, DJD, DDD, depresson, asthma, HTN x 10 yr, who presents now with 1 wk of N, V after starting Cipro for UTI.  No appetite, Hx of Cr of 1.5-1.8, and now Cr 6.88.  Initially felt dehydrated but no low bps.  Hx Noct x 3-4, no rash, sores in eyes, mouth or acute arthritis.  Swelling and pressure in legs, a couple of weeks ago but not now. .  No cough, fever, chills but is weak.  No NSAIDs, ETOH or ACEI.   No new meds but Cipro.  See Allergy list.  Had UA but not clear had culture before RX for Cipro. Constitutional: as above Eyes: negative Ears, nose, mouth, throat, and face: negative Respiratory: negative Cardiovascular: as above Gastrointestinal: V several time/d since Mon Genitourinary:had dysuria on Mon Integument/breast: negative Hematologic/lymphatic: hx ITP Musculoskeletal:back and knee Neurological: HA , relates to bp Behavioral/Psych: depresson Endocrine: as above Allergic/Immunologic: Adhesive tape, PCN, Brillintex,  Cymbalta, yellow jackets   Past Medical History:  Diagnosis Date  . Allergy   . Anxiety   . Asthma   . CAD (coronary artery disease), native coronary artery    a. cath 05/20/17 - 100% distal PDA --> medical mangement (no intervention done)  . Candida esophagitis ([place])   . Chest pain at rest 04/2017  . Depression   . Essential hypertension, benign   . GERD (gastroesophageal reflux disease)   . Gout   . Hemorrhoids   . 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  Last was Prednisone '40mg'$ ; 2 wk in 10/2012.  She also was on Prednisone bridged with Cellcept briefly but stopped due to lack of insurance.   . Myocardial infarction ([place])   . Pulmonary embolism  ([place]) 04/2012  . Renal insufficiency   . Serrated adenoma of colon   . Steroid-induced diabetes ([place])   . Stroke ([place])   . Thrush, oral 05/29/2011    Past Surgical History:  Procedure Laterality Date  . BONE MARROW BIOPSY    . CARDIAC CATHETERIZATION    . CARPAL TUNNEL RELEASE    . CARPAL TUNNEL RELEASE Left 05/20/2016   Procedure: CARPAL TUNNEL RELEASE;  Surgeon: Carole Civil, MD;  Location: [facility];  Service: Orthopedics;  Laterality: Left;  . CARPAL TUNNEL RELEASE Right 07/16/2016   Procedure: RIGHT CARPAL TUNNEL RELEASE;  Surgeon: Carole Civil, MD;  Location: [facility];  Service: Orthopedics;  Laterality: Right;  . CATARACT EXTRACTION    . CHOLECYSTECTOMY  2008  . COLONOSCOPY WITH ESOPHAGOGASTRODUODENOSCOPY (EGD) N/A 01/04/2013   Dr. Gala Romney: esophageal plaques with +KOH, hh. Gastric antrum abnormal, bx reactive gastropathy. Anal canal hemorrhoids, colonic tics, normal TI, single polyp (sessile serrated tubular adenoma). Next TCS 12/2017  . LEFT HEART CATH AND CORONARY ANGIOGRAPHY N/A 05/20/2017   Procedure: LEFT HEART CATH AND CORONARY ANGIOGRAPHY;  Surgeon: Martinique, Peter M, MD;  Location: [facility];  Service: Cardiovascular;  Laterality: N/A;  . 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 TEE WITHOUT CARDIOVERSION N/A 01/03/2016   Procedure: TRANSESOPHAGEAL ECHOCARDIOGRAM (TEE) WITH PROPOFOL;  Surgeon: Satira Sark, MD;  Location: [facility];  Service: Cardiovascular;  Laterality: N/A;    Family History  Problem Relation Age of Onset  .  Cervical cancer Mother   . Lung cancer Father   . Colon polyps Father        ???  . Pneumonia Brother   . Colon cancer Paternal Grandmother 88  . AAA (abdominal aortic aneurysm) Paternal Grandmother   . Breast cancer Paternal Grandmother   . Colon cancer Paternal Grandfather 32  . Barrett's esophagus Paternal Aunt   . Stomach cancer Neg Hx   . Pancreatic cancer Neg Hx     Social History:  reports that she quit smoking about 9 years  ago. Her smoking use included cigarettes. She quit after 0.00 years of use. She has never used smokeless tobacco. She reports that she does not drink alcohol or use drugs.  Allergies:  Allergies  Allergen Reactions  . Brintellix [Vortioxetine] Itching  . Penicillins Itching  . Cymbalta [Duloxetine Hcl] Itching  . Yellow Jacket Venom Swelling  . Adhesive [Tape] Rash    Please use paper tape  . Zoloft [Sertraline Hcl] Diarrhea    Medications:  I have reviewed the patient's current medications. Prior to Admission:  Medications Prior to Admission  Medication Sig Dispense Refill Last Dose  . ciprofloxacin (CIPRO) 500 MG tablet Take 500 mg by mouth daily with breakfast.   12/30/2018 at Unknown time  . diclofenac sodium (VOLTAREN) 1 % GEL Apply 2 g topically 4 (four) times daily.   More than a month at Unknown time  . diltiazem (CARDIZEM) 120 MG tablet Take 120 mg by mouth daily.   12/30/2018 at Unknown time  . ipratropium-albuterol (DUONEB) 0.5-2.5 (3) MG/3ML SOLN Take 3 mLs by nebulization 3 (three) times daily as needed (shorntess of breath/wheezing). (Patient taking differently: Take 3 mLs by nebulization 3 (three) times daily as needed (shortness of breath/wheezing). ) 360 mL 0 04/28/2018 at Unknown time  . isosorbide dinitrate (ISORDIL) 10 MG tablet Take 10 mg by mouth daily.    12/30/2018 at Unknown time  . nitroGLYCERIN (NITROSTAT) 0.4 MG SL tablet Place 1 tablet (0.4 mg total) under the tongue every 5 (five) minutes x 3 doses as needed for chest pain. 25 tablet 12 04/28/2018 at Unknown time  . Oxycodone HCl 10 MG TABS Take 10 mg by mouth every 4 (four) hours as needed (for pain).   0 12/30/2018 at Unknown time  . PROAIR RESPICLICK 101 (90 BASE) MCG/ACT AEPB Inhale 2 puffs into the lungs every 4 (four) hours as needed (Shortness of breath).   3 04/28/2018 at Unknown time  . sertraline (ZOLOFT) 100 MG tablet Take 1 tablet by mouth at bedtime.   12/30/2018 at Unknown time  . ALPRAZolam (XANAX) 0.5 MG  tablet Take 1 tablet by mouth 3 (three) times daily.   2 Not Taking at Unknown time  . aspirin EC 81 MG EC tablet Take 1 tablet (81 mg total) by mouth daily. (Patient not taking: Reported on 12/31/2018)   Not Taking at Unknown time  . azithromycin (ZITHROMAX) 250 MG tablet Take 250-500 mg by mouth See admin instructions. Starting on 06/20/2018 take '500mg'$  on day 1, then take '250mg'$  on days 2 through 5  0 Completed Course at Unknown time  . HYDROcodone-acetaminophen (NORCO) 10-325 MG tablet Take 1 tablet by mouth every 4 (four) hours as needed.  0 Not Taking at Unknown time     Results for orders placed or performed during the hospital encounter of 12/30/18 (from the past 48 hour(s))  CBC with Differential     Status: Abnormal   Collection Time: 12/30/18 10:23 PM  Result Value Ref Range   WBC 7.6 4.0 - 10.5 K/uL   RBC 3.61 (L) 3.87 - 5.11 MIL/uL   Hemoglobin 10.2 (L) 12.0 - 15.0 g/dL   HCT 31.8 (L) 36.0 - 46.0 %   MCV 88.1 80.0 - 100.0 fL   MCH 28.3 26.0 - 34.0 pg   MCHC 32.1 30.0 - 36.0 g/dL   RDW 15.8 (H) 11.5 - 15.5 %   Platelets 154 150 - 400 K/uL   nRBC 0.0 0.0 - 0.2 %   Neutrophils Relative % 57 %   Neutro Abs 4.3 1.7 - 7.7 K/uL   Lymphocytes Relative 33 %   Lymphs Abs 2.5 0.7 - 4.0 K/uL   Monocytes Relative 8 %   Monocytes Absolute 0.6 0.1 - 1.0 K/uL   Eosinophils Relative 1 %   Eosinophils Absolute 0.1 0.0 - 0.5 K/uL   Basophils Relative 1 %   Basophils Absolute 0.1 0.0 - 0.1 K/uL   Immature Granulocytes 0 %   Abs Immature Granulocytes 0.02 0.00 - 0.07 K/uL    Comment: Performed at [facility], [address], [place], [place] [address]  Comprehensive metabolic panel     Status: Abnormal   Collection Time: 12/30/18 10:23 PM  Result Value Ref Range   Sodium 140 135 - 145 mmol/L   Potassium 3.7 3.5 - 5.1 mmol/L   Chloride 110 98 - 111 mmol/L   CO2 16 (L) 22 - 32 mmol/L   Glucose, Bld 91 70 - 99 mg/dL   BUN 72 (H) 6 - 20 mg/dL   Creatinine, Ser 6.88 (H) 0.44 - 1.00 mg/dL    Calcium 8.7 (L) 8.9 - 10.3 mg/dL   Total Protein 6.8 6.5 - 8.1 g/dL   Albumin 3.5 3.5 - 5.0 g/dL   AST 18 15 - 41 U/L   ALT 12 0 - 44 U/L   Alkaline Phosphatase 64 38 - 126 U/L   Total Bilirubin 0.5 0.3 - 1.2 mg/dL   GFR calc non Af Amer 6 (L) >60 mL/min   GFR calc Af Amer 7 (L) >60 mL/min   Anion gap 14 5 - 15    Comment: Performed at [facility], [address] [place], [place] [address]  Lipase, blood     Status: Abnormal   Collection Time: 12/30/18 10:23 PM  Result Value Ref Range   Lipase 58 (H) 11 - 51 U/L    Comment: Performed at [facility], [address], [place], [place] [address]  Troponin I - ONCE - STAT     Status: Abnormal   Collection Time: 12/30/18 10:23 PM  Result Value Ref Range   Troponin I 0.15 (HH) <0.03 ng/mL    Comment: CRITICAL RESULT CALLED TO, READ BACK BY AND VERIFIED WITH: MCKINNEY,A @ 9147 ON 12/30/18 BY JUW Performed at [facility], [address] [place],  [address]   Urinalysis, Routine w reflex microscopic     Status: Abnormal   Collection Time: 12/30/18 10:30 PM  Result Value Ref Range   Color, Urine STRAW (A) YELLOW   APPearance CLEAR CLEAR   Specific Gravity, Urine 1.010 1.005 - 1.030   pH 6.0 5.0 - 8.0   Glucose, UA NEGATIVE NEGATIVE mg/dL   Hgb urine dipstick MODERATE (A) NEGATIVE   Bilirubin Urine NEGATIVE NEGATIVE   Ketones, ur 5 (A) NEGATIVE mg/dL   Protein, ur 100 (A) NEGATIVE mg/dL   Nitrite NEGATIVE NEGATIVE   Leukocytes,Ua TRACE (A) NEGATIVE   RBC / HPF 21-50  0 - 5 RBC/hpf   WBC, UA 21-50 0 - 5 WBC/hpf   Bacteria, UA NONE SEEN NONE SEEN   Squamous Epithelial / LPF 0-5 0 - 5    Comment: Performed at [facility], [address] [place], [place] [address]  Sodium, urine, random     Status: None   Collection Time: 12/31/18  2:56 AM  Result Value Ref Range   Sodium, Ur 74 mmol/L    Comment: Performed at [facility], [address], [place], [place] [address]  Creatinine, urine, random     Status: None   Collection Time:  12/31/18  2:56 AM  Result Value Ref Range   Creatinine, Urine 40.82 mg/dL    Comment: Performed at [facility], [address], [place], [place] [address]  Troponin I - Now Then Q6H     Status: Abnormal   Collection Time: 12/31/18  4:37 AM  Result Value Ref Range   Troponin I 0.12 (HH) <0.03 ng/mL    Comment: CRITICAL VALUE NOTED.  VALUE IS CONSISTENT WITH PREVIOUSLY REPORTED AND CALLED VALUE. Performed at [facility], [address] [place], [place] [address]   CBC     Status: Abnormal   Collection Time: 12/31/18  4:37 AM  Result Value Ref Range   WBC 6.7 4.0 - 10.5 K/uL   RBC 3.33 (L) 3.87 - 5.11 MIL/uL   Hemoglobin 9.3 (L) 12.0 - 15.0 g/dL   HCT 29.3 (L) 36.0 - 46.0 %   MCV 88.0 80.0 - 100.0 fL   MCH 27.9 26.0 - 34.0 pg   MCHC 31.7 30.0 - 36.0 g/dL   RDW 15.9 (H) 11.5 - 15.5 %   Platelets 142 (L) 150 - 400 K/uL   nRBC 0.0 0.0 - 0.2 %    Comment: Performed at [facility], [address], [place], [place] [address]  Comprehensive metabolic panel     Status: Abnormal   Collection Time: 12/31/18  4:37 AM  Result Value Ref Range   Sodium 139 135 - 145 mmol/L   Potassium 3.2 (L) 3.5 - 5.1 mmol/L   Chloride 112 (H) 98 - 111 mmol/L   CO2 16 (L) 22 - 32 mmol/L   Glucose, Bld 106 (H) 70 - 99 mg/dL   BUN 64 (H) 6 - 20 mg/dL   Creatinine, Ser 6.79 (H) 0.44 - 1.00 mg/dL   Calcium 7.9 (L) 8.9 - 10.3 mg/dL   Total Protein 6.0 (L) 6.5 - 8.1 g/dL   Albumin 3.0 (L) 3.5 - 5.0 g/dL   AST 15 15 - 41 U/L   ALT 9 0 - 44 U/L   Alkaline Phosphatase 58 38 - 126 U/L   Total Bilirubin 0.5 0.3 - 1.2 mg/dL   GFR calc non Af Amer 6 (L) >60 mL/min   GFR calc Af Amer 7 (L) >60 mL/min   Anion gap 11 5 - 15    Comment: Performed at [facility], [address] [place], [place] [address]    Ct Renal Stone Study  Result Date: 12/31/2018 CLINICAL DATA:  Dysuria. Recent diagnosis of UTI. Fever. Vomiting. EXAM: CT ABDOMEN AND PELVIS WITHOUT CONTRAST TECHNIQUE: Multidetector CT imaging of the abdomen and  pelvis was performed following the standard protocol without IV contrast. COMPARISON:  CT scan Feb 12, 2018. FINDINGS: Lower chest: Bilateral dependent atelectasis. The lower chest is otherwise unremarkable. Hepatobiliary: Previous cholecystectomy. The left hepatic lobe is hypertrophied but stable. No liver masses are noted. Pancreas: Unremarkable. No pancreatic ductal dilatation  or surrounding inflammatory changes. Spleen: Previous splenectomy. Adrenals/Urinary Tract: Adrenal glands are normal. No renal stones, masses, or hydronephrosis. No ureteral stones. The bladder is unremarkable. Stomach/Bowel: The stomach and small bowel are normal. Colonic diverticulosis is seen without diverticulitis. The remainder of the colon is normal. The visualized appendix is unremarkable. Vascular/Lymphatic: The abdominal aorta is unremarkable. 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 No adenopathy. Reproductive: Uterus and bilateral adnexa are unremarkable. Other: No abdominal wall hernia or abnormality. No abdominopelvic ascites. Musculoskeletal: Anterior wedging of T10 and T12 is stable. No changes in the visualized bones. IMPRESSION: 1. No cause for the patient's symptoms identified. 2. Colonic diverticulosis without diverticulitis. 3. 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 Electronically Signed   By: Dorise Bullion III M.D   On: 12/31/2018 00:24    ROS Blood pressure 115/77, pulse 62, temperature 98.5 F (36.9 C), temperature source Oral, resp. rate 20, height '5\' 5"'$  (1.651 m), weight 81.7 kg, last menstrual period 08/13/2011, SpO2 97 %. Physical Exam Physical Examination: General appearance - alert, well appearing, and in no distress, oriented to person, place, and time and overweight Mental status - alert, oriented to person, place, and time Eyes - pupils equal and reactive, extraocular eye movements  intact Mouth - blue from jello Neck - adenopathy noted PCL Lymphatics - posterior cervical nodes Chest - clear to auscultation, no wheezes, rales or rhonchi, symmetric air entry Heart - S1 and S2 normal Gr2/6 M Abdomen - soft, pos bs, nontender Extremities - peripheral pulses normal, no pedal edema, no clubbing or cyanosis Skin - normal coloration and turgor, no rashes, no suspicious skin lesions noted  Assessment/Plan: 1 AKI  Not typical of volume depletion.  Urine chem c/w tubular damage.  Suspect toxic effect from Cipro, vs other AIN or AGN.  Need to R/o obstruction.  Has adequate vol 2 Hx ITP 3 Hypertension: elevated, can use carvedilol, Ca channel blocker, or hydralazine 4. Anemia will check Fe 5. Metabolic Bone Disease: check PTH 6 Depression 7 DJD P U/S, Urine eos, stop ivf, Fe, PTH, if not better will bx in 2-3 d.  Jeneen Rinks Amry Cathy 12/31/2018, 10:46 AM

## 2018-12-31 NOTE — ED Notes (Signed)
Date and time results received: 12/31/18 2330 (use smartphrase ".now" to insert current time)  Test: troponin Critical Value: 0.15  Name of Provider Notified: Almyra Free Idol notified at 0100  Orders Received? Or Actions Taken?: no/na

## 2018-12-31 NOTE — ED Notes (Signed)
ED TO INPATIENT HANDOFF REPORT  ED Nurse Name and Phone #: C Jahden Schara  S Name/Age/Gender Summer Cook 53 y.o. female Room/Bed: APA06/APA06  Code Status   Code Status: Prior  Home/SNF/Other Home Patient oriented to: self, place, time and situation Is this baseline? Yes   Triage Complete: Triage complete  Chief Complaint Emesis  Triage Note Pt reports going to doctor on Monday and being diagnosed with UTI, pt started on Cipro, vomiting started Monday night, Zofran was called in and started on Tuesday, pt reports she has been unable to keep anything down since Tuesday, reports fever around 100 orally at home, in triage oral temp is 98.1. Pt has not had Tylenol since this morning, reports vomiting about 4-5 times today. Pt also reports generalized weakness.    Allergies Allergies  Allergen Reactions  . Brintellix [Vortioxetine] Itching  . Penicillins Itching  . Cymbalta [Duloxetine Hcl] Itching  . Yellow Jacket Venom Swelling  . Adhesive [Tape] Rash    Please use paper tape  . Zoloft [Sertraline Hcl] Diarrhea    Level of Care/Admitting Diagnosis ED Disposition    ED Disposition Condition [facility] Area: [facility] [[id number]]  Level of Care: Telemetry [5]  Diagnosis: AKI (acute kidney injury) [facility]) [[id number]]  Admitting Physician: Loree Fee  Attending Physician: Oswald Hillock [[id number]]  Estimated length of stay: 3 - 4 days  Certification:: I certify this patient will need inpatient services for at least 2 midnights  PT Class (Do Not Modify): Inpatient [101]  PT Acc Code (Do Not Modify): Private [1]       B Medical/Surgery History Past Medical History:  Diagnosis Date  . Allergy   . Anxiety   . Asthma   . CAD (coronary artery disease), native coronary artery    a. cath 05/20/17 - 100% distal PDA --> medical mangement (no intervention done)  . Candida esophagitis ([place])   . Chest pain at rest 04/2017  . Depression   . Essential  hypertension, benign   . GERD (gastroesophageal reflux disease)   . Gout   . Hemorrhoids   . 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  Last was Prednisone 52m; 2 wk in 10/2012.  She also was on Prednisone bridged with Cellcept briefly but stopped due to lack of insurance.   . Myocardial infarction ([place]   . Pulmonary embolism ([place] 04/2012  . Renal insufficiency   . Serrated adenoma of colon   . Steroid-induced diabetes ([place]   . Stroke ([place]   . Thrush, oral 05/29/2011   Past Surgical History:  Procedure Laterality Date  . BONE MARROW BIOPSY    . CARDIAC CATHETERIZATION    . CARPAL TUNNEL RELEASE    . CARPAL TUNNEL RELEASE Left 05/20/2016   Procedure: CARPAL TUNNEL RELEASE;  Surgeon: SCarole Civil MD;  Location: [facility] OR[facility];  Service: Orthopedics;  Laterality: Left;  . CARPAL TUNNEL RELEASE Right 07/16/2016   Procedure: RIGHT CARPAL TUNNEL RELEASE;  Surgeon: SCarole Civil MD;  Location: [facility] ORS;  Service: Orthopedics;  Laterality: Right;  . CATARACT EXTRACTION    . CHOLECYSTECTOMY  2008  . COLONOSCOPY WITH ESOPHAGOGASTRODUODENOSCOPY (EGD) N/A 01/04/2013   Dr. RGala Romney esophageal plaques with +KOH, hh. Gastric antrum abnormal, bx reactive gastropathy. Anal canal hemorrhoids, colonic tics, normal TI, single polyp (sessile serrated tubular adenoma). Next TCS 12/2017  . LEFT HEART CATH AND CORONARY ANGIOGRAPHY  N/A 05/20/2017   Procedure: LEFT HEART CATH AND CORONARY ANGIOGRAPHY;  Surgeon: Martinique, Peter M, MD;  Location: [facility];  Service: Cardiovascular;  Laterality: N/A;  . 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 TEE WITHOUT CARDIOVERSION N/A 01/03/2016   Procedure: TRANSESOPHAGEAL ECHOCARDIOGRAM (TEE) WITH PROPOFOL;  Surgeon: Satira Sark, MD;  Location: [facility] END[facility] SU[facility];  Service: Cardiovascular;  Laterality: N/A;     A IV Location/Drains/Wounds Patient Lines/Drains/Airways  Status   Active Line/Drains/Airways    Name:   Placement date:   Placement time:   Site:   Days:   Peripheral IV 12/30/18 Left Hand   12/30/18    2241    Hand   1          Intake/Output Last 24 hours  Intake/Output Summary (Last 24 hours) at 12/31/2018 0217 Last data filed at 12/31/2018 0142 Gross per 24 hour  Intake 197.57 ml  Output -  Net 197.57 ml    Labs/Imaging Results for orders placed or performed during the hospital encounter of 12/30/18 (from the past 48 hour(s))  CBC with Differential     Status: Abnormal   Collection Time: 12/30/18 10:23 PM  Result Value Ref Range   WBC 7.6 4.0 - 10.5 K/uL   RBC 3.61 (L) 3.87 - 5.11 MIL/uL   Hemoglobin 10.2 (L) 12.0 - 15.0 g/dL   HCT 31.8 (L) 36.0 - 46.0 %   MCV 88.1 80.0 - 100.0 fL   MCH 28.3 26.0 - 34.0 pg   MCHC 32.1 30.0 - 36.0 g/dL   RDW 15.8 (H) 11.5 - 15.5 %   Platelets 154 150 - 400 K/uL   nRBC 0.0 0.0 - 0.2 %   Neutrophils Relative % 57 %   Neutro Abs 4.3 1.7 - 7.7 K/uL   Lymphocytes Relative 33 %   Lymphs Abs 2.5 0.7 - 4.0 K/uL   Monocytes Relative 8 %   Monocytes Absolute 0.6 0.1 - 1.0 K/uL   Eosinophils Relative 1 %   Eosinophils Absolute 0.1 0.0 - 0.5 K/uL   Basophils Relative 1 %   Basophils Absolute 0.1 0.0 - 0.1 K/uL   Immature Granulocytes 0 %   Abs Immature Granulocytes 0.02 0.00 - 0.07 K/uL    Comment: Performed at [facility], [address], [place], [place] [address]  Comprehensive metabolic panel     Status: Abnormal   Collection Time: 12/30/18 10:23 PM  Result Value Ref Range   Sodium 140 135 - 145 mmol/L   Potassium 3.7 3.5 - 5.1 mmol/L   Chloride 110 98 - 111 mmol/L   CO2 16 (L) 22 - 32 mmol/L   Glucose, Bld 91 70 - 99 mg/dL   BUN 72 (H) 6 - 20 mg/dL   Creatinine, Ser 6.88 (H) 0.44 - 1.00 mg/dL   Calcium 8.7 (L) 8.9 - 10.3 mg/dL   Total Protein 6.8 6.5 - 8.1 g/dL   Albumin 3.5 3.5 - 5.0 g/dL   AST 18 15 - 41 U/L   ALT 12 0 - 44 U/L   Alkaline Phosphatase 64 38 - 126 U/L   Total Bilirubin  0.5 0.3 - 1.2 mg/dL   GFR calc non Af Amer 6 (L) >60 mL/min   GFR calc Af Amer 7 (L) >60 mL/min   Anion gap 14 5 - 15    Comment: Performed at [facility], [address], [place], [place] [address]  Lipase, blood     Status: Abnormal   Collection Time: 12/30/18  10:23 PM  Result Value Ref Range   Lipase 58 (H) 11 - 51 U/L    Comment: Performed at [facility], [address] [place], [place] [address]  Troponin I - ONCE - STAT     Status: Abnormal   Collection Time: 12/30/18 10:23 PM  Result Value Ref Range   Troponin I 0.15 (HH) <0.03 ng/mL    Comment: CRITICAL RESULT CALLED TO, READ BACK BY AND VERIFIED WITH: MCKINNEY,A @ [id number] ON 12/30/18 BY JUW Performed at [facility], [address], [place], [place] [address]   Urinalysis, Routine w reflex microscopic     Status: Abnormal   Collection Time: 12/30/18 10:30 PM  Result Value Ref Range   Color, Urine STRAW (A) YELLOW   APPearance CLEAR CLEAR   Specific Gravity, Urine 1.010 1.005 - 1.030   pH 6.0 5.0 - 8.0   Glucose, UA NEGATIVE NEGATIVE mg/dL   Hgb urine dipstick MODERATE (A) NEGATIVE   Bilirubin Urine NEGATIVE NEGATIVE   Ketones, ur 5 (A) NEGATIVE mg/dL   Protein, ur 100 (A) NEGATIVE mg/dL   Nitrite NEGATIVE NEGATIVE   Leukocytes,Ua TRACE (A) NEGATIVE   RBC / HPF 21-50 0 - 5 RBC/hpf   WBC, UA 21-50 0 - 5 WBC/hpf   Bacteria, UA NONE SEEN NONE SEEN   Squamous Epithelial / LPF 0-5 0 - 5    Comment: Performed at [facility], [address] [place], [place] [address]   Ct Renal Stone Study  Result Date: 12/31/2018 CLINICAL DATA:  Dysuria. Recent diagnosis of UTI. Fever. Vomiting. EXAM: CT ABDOMEN AND PELVIS WITHOUT CONTRAST TECHNIQUE: Multidetector CT imaging of the abdomen and pelvis was performed following the standard protocol without IV contrast. COMPARISON:  CT scan Feb 12, 2018. FINDINGS: Lower chest: Bilateral dependent atelectasis. The lower chest is otherwise unremarkable. Hepatobiliary: Previous cholecystectomy. The left  hepatic lobe is hypertrophied but stable. No liver masses are noted. Pancreas: Unremarkable. No pancreatic ductal dilatation or surrounding inflammatory changes. Spleen: Previous splenectomy. Adrenals/Urinary Tract: Adrenal glands are normal. No renal stones, masses, or hydronephrosis. No ureteral stones. The bladder is unremarkable. Stomach/Bowel: The stomach and small bowel are normal. Colonic diverticulosis is seen without diverticulitis. The remainder of the colon is normal. The visualized appendix is unremarkable. Vascular/Lymphatic: The abdominal aorta is unremarkable. 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 No adenopathy. Reproductive: Uterus and bilateral adnexa are unremarkable. Other: No abdominal wall hernia or abnormality. No abdominopelvic ascites. Musculoskeletal: Anterior wedging of T10 and T12 is stable. No changes in the visualized bones. IMPRESSION: 1. No cause for the patient's symptoms identified. 2. Colonic diverticulosis without diverticulitis. 3. 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 Electronically Signed   By: Dorise Bullion III M.D   On: 12/31/2018 00:24    Pending Labs Unresulted Labs (From admission, onward)    Start     Ordered   Signed and Held  HIV antibody (Routine Testing)  Once,   R     Signed and Held   Signed and Held  CBC  (heparin)  Once,   R    Comments:  Baseline for heparin therapy IF NOT ALREADY DRAWN.  Notify MD if PLT < 100 K.    Signed and Held   Signed and Held  Creatinine, serum  (heparin)  Once,   R    Comments:  Baseline for heparin therapy IF NOT ALREADY DRAWN.  Signed and Held   Signed and Held  CBC  Tomorrow morning,   R     Signed and Held   Signed and Held  Comprehensive metabolic panel  Tomorrow morning,   R     Signed and Held   Signed and Held  Culture, Urine  Once,   R     Signed and Held   Signed and Held  Sodium, urine,  random  Once,   R     Signed and Held   Signed and Held  Creatinine, urine, random  Once,   R     Signed and Held   Signed and Held  Troponin I - Now Then Q6H  Now then every 6 hours,   R     Signed and Held          Vitals/Pain Today's Vitals   12/31/18 0100 12/31/18 0104 12/31/18 0130 12/31/18 0141  BP: (!) 161/116  (S) (!) 171/116   Pulse: 85  80   Resp: 20  (!) 21   Temp:      SpO2: 98%  97%   Weight:      Height:      PainSc:  8   6     Isolation Precautions No active isolations  Medications Medications  metoprolol tartrate (LOPRESSOR) tablet 25 mg (25 mg Oral Given 12/31/18 0215)  hydrALAZINE (APRESOLINE) tablet 25 mg (25 mg Oral Given 12/31/18 0215)  promethazine (PHENERGAN) injection 12.5 mg (12.5 mg Intravenous Given 12/30/18 2242)  sodium chloride 0.9 % bolus 1,000 mL (1,000 mLs Intravenous New Bag/Given 12/31/18 0026)  ciprofloxacin (CIPRO) IVPB 400 mg (0 mg Intravenous Stopped 12/31/18 0142)  fentaNYL (SUBLIMAZE) injection 50 mcg (50 mcg Intravenous Given 12/31/18 0104)    Mobility walks Low fall risk   Focused Assessments    R Recommendations: See Admitting Provider Note  Report given to:   Additional Notes:

## 2018-12-31 NOTE — ED Notes (Signed)
Patient transported to CT 

## 2019-01-01 DIAGNOSIS — R7989 Other specified abnormal findings of blood chemistry: Secondary | ICD-10-CM

## 2019-01-01 DIAGNOSIS — I2510 Atherosclerotic heart disease of native coronary artery without angina pectoris: Secondary | ICD-10-CM

## 2019-01-01 LAB — PROTIME-INR
INR: 1.1 (ref 0.8–1.2)
Prothrombin Time: 13.7 seconds (ref 11.4–15.2)

## 2019-01-01 LAB — URINE CULTURE: Culture: NO GROWTH

## 2019-01-01 LAB — RENAL FUNCTION PANEL
Albumin: 2.8 g/dL — ABNORMAL LOW (ref 3.5–5.0)
Anion gap: 10 (ref 5–15)
BUN: 65 mg/dL — ABNORMAL HIGH (ref 6–20)
CO2: 16 mmol/L — ABNORMAL LOW (ref 22–32)
Calcium: 7.7 mg/dL — ABNORMAL LOW (ref 8.9–10.3)
Chloride: 113 mmol/L — ABNORMAL HIGH (ref 98–111)
Creatinine, Ser: 7.13 mg/dL — ABNORMAL HIGH (ref 0.44–1.00)
GFR calc Af Amer: 7 mL/min — ABNORMAL LOW (ref >60)
GFR calc non Af Amer: 6 mL/min — ABNORMAL LOW (ref >60)
Glucose, Bld: 91 mg/dL (ref 70–99)
Phosphorus: 6.7 mg/dL — ABNORMAL HIGH (ref 2.5–4.6)
Potassium: 4 mmol/L (ref 3.5–5.1)
Sodium: 139 mmol/L (ref 135–145)

## 2019-01-01 LAB — APTT: aPTT: 34 seconds (ref 24–36)

## 2019-01-01 LAB — PLATELET FUNCTION ASSAY: Collagen / Epinephrine: 163 seconds (ref 0–193)

## 2019-01-01 LAB — HIV ANTIBODY (ROUTINE TESTING W REFLEX): HIV Screen 4th Generation wRfx: NONREACTIVE

## 2019-01-01 MED ORDER — IPRATROPIUM-ALBUTEROL 0.5-2.5 (3) MG/3ML IN SOLN: 3.0000 mL | Freq: Four times a day (QID) | RESPIRATORY_TRACT | Status: DC | PRN

## 2019-01-01 NOTE — Progress Notes (Signed)
PROGRESS NOTE    Summer Cook  CXK:[id number] DOB: September 13, 1966 DOA: 12/30/2018 PCP: Redmond School, MD     Brief Narrative:  53 y.o. female, with a history of hypertension, CAD, GERD, ITP status post splenectomy, history of PE in 2013, came to hospital with 5-day history of nausea and vomiting.  Patient says that she developed dysuria along with nausea vomiting last Friday and was seen by her PCP on Monday.  She was prescribed ciprofloxacin for UTI.  She has been taking it every day.  Patient also was prescribed diltiazem for hypertension, earlier she was taking amlodipine which did not improve her blood pressure. Patient also was given Zofran by her PCP for nausea vomiting which did not help.  She has experienced worsening fatigue, myalgias.  Complains of chest pain denies abdominal pain.  Also complains of headache, in the ED patient was found to be in hypertensive urgency. Denies previous history of seizures. She had stroke in 2017. Also has a history of CAD, medically managed with aspirin, Imdur.  In the ED, lab work revealed creatinine 6.88, patient's baseline creatinine 1.57. Also found to have elevated troponin 0 0.15.   Assessment & Plan: 1-acute on chronic renal failure: Stage III at baseline -With concerns for acute interstitial nephritis Vs AGN -Continue supportive care, follow ANCA results as per nephrology recommendations -If no improvement in renal function/GFR plan is to pursued renal biopsy -Renal ultrasound demonstrated no obstructive uropathy or hydronephrosis. -Continue avoiding nephrotoxic agents. -Continue serum bicarbonate.  2-metabolic acidosis -In the setting of acute renal failure -Continue serum bicarbonate.  3-history of coronary artery disease -No chest pain or shortness of breath -Continue beta-blocker and IMDUR. -Mild elevation in troponin associated with renal failure. -Discontinue telemetry.  4-hypertension -Fair control -Continue current  antihypertensive regimen.  5-depression/anxiety -Mood stable -No suicidal ideation or hallucination -no taking any antidepressant prior to admission.  6-history of asthma -Stable and well-controlled -Continue as needed DuoNeb.  7-hx of ITP -platelets stable -continue to monitor trend   8-hx of stroke -continue risk factor modification -no new focal deficits.  DVT prophylaxis: Heparin Code Status: Full code Family Communication: No family at bedside. Disposition Plan: Advance diet, continue to follow creatinine trend; follow nephrology recommendations.  GFR has worsened and the patient might require renal biopsy.  Consultants:   Nephrology  Procedures:   See below for x-ray reports.  Renal ultrasound demonstrating no hydronephrosis, chronic kidney disease changes and no obstructive uropathy.  Antimicrobials:  Anti-infectives (From admission, onward)   Start     Dose/Rate Route Frequency Ordered Stop   12/31/18 0000  ciprofloxacin (CIPRO) IVPB 400 mg     400 mg 200 mL/hr over 60 Minutes Intravenous  Once 12/30/18 2350 12/31/18 0142       Subjective: Afebrile, no chest pain, no vomiting, no abdominal pain no dysuria.  Reports mild nausea in the morning.  Expressed increased appetite and wanted to have diet advanced.  Patient reported good urine output.  Objective: Vitals:   12/31/18 1943 12/31/18 2040 12/31/18 2202 01/01/19 0509  BP: (!) 128/100  (!) 118/91 (!) 128/93  Pulse: 68  66 63  Resp: 18  18 18   Temp: 98.8 F (37.1 C)  98.2 F (36.8 C) 98.2 F (36.8 C)  TempSrc: Oral  Oral Oral  SpO2: 100% 98% 96% 97%  Weight:    83.4 kg  Height:        Intake/Output Summary (Last 24 hours) at 01/01/2019 1231 Last data filed at 01/01/2019 0900  Gross per 24 hour  Intake 1179.18 ml  Output -  Net 1179.18 ml   Filed Weights   12/30/18 2113 12/31/18 0246 01/01/19 0509  Weight: 83 kg 81.7 kg 83.4 kg    Examination: General exam: Alert, awake, oriented x 3; reports  feeling slightly nauseated in the morning, no shortness of breath, no vomiting, no abdominal pain, no dysuria.  Patient reported good urine output. Respiratory system: Clear to auscultation. Respiratory effort normal. Cardiovascular system:RRR. No murmurs, rubs, gallops. Gastrointestinal system: Abdomen is nondistended, soft and nontender. No organomegaly or masses felt. Normal bowel sounds heard. Central nervous system: Alert and oriented. No focal neurological deficits. Extremities: No C/C/E, +pedal pulses Skin: No rashes, lesions or ulcers Psychiatry: Judgement and insight appear normal. Mood & affect appropriate.   Data Reviewed: I have personally reviewed following labs and imaging studies  CBC: Recent Labs  Lab 12/30/18 2223 12/31/18 0437  WBC 7.6 6.7  NEUTROABS 4.3  --   HGB 10.2* 9.3*  HCT 31.8* 29.3*  MCV 88.1 88.0  PLT 154 119*   Basic Metabolic Panel: Recent Labs  Lab 12/30/18 2223 12/31/18 0437 01/01/19 0505  NA 140 139 139  K 3.7 3.2* 4.0  CL 110 112* 113*  CO2 16* 16* 16*  GLUCOSE 91 106* 91  BUN 72* 64* 65*  CREATININE 6.88* 6.79* 7.13*  CALCIUM 8.7* 7.9* 7.7*  PHOS  --   --  6.7*   GFR: Estimated Creatinine Clearance: 9.7 mL/min (A) (by C-G formula based on SCr of 7.13 mg/dL (H)).   Liver Function Tests: Recent Labs  Lab 12/30/18 2223 12/31/18 0437 01/01/19 0505  AST 18 15  --   ALT 12 9  --   ALKPHOS 64 58  --   BILITOT 0.5 0.5  --   PROT 6.8 6.0*  --   ALBUMIN 3.5 3.0* 2.8*   Recent Labs  Lab 12/30/18 2223  LIPASE 58*   Cardiac Enzymes: Recent Labs  Lab 12/30/18 2223 12/31/18 0437 12/31/18 1117 12/31/18 1443  TROPONINI 0.15* 0.12* 0.11* 0.10*   Anemia Panel: Recent Labs    12/31/18 1117  TIBC 242*  IRON 49   Urine analysis:    Component Value Date/Time   COLORURINE STRAW (A) 12/30/2018 2230   APPEARANCEUR CLEAR 12/30/2018 2230   LABSPEC 1.010 12/30/2018 2230   PHURINE 6.0 12/30/2018 2230   GLUCOSEU NEGATIVE  12/30/2018 2230   HGBUR MODERATE (A) 12/30/2018 2230   BILIRUBINUR NEGATIVE 12/30/2018 2230   KETONESUR 5 (A) 12/30/2018 2230   PROTEINUR 100 (A) 12/30/2018 2230   UROBILINOGEN 0.2 02/17/2015 1325   NITRITE NEGATIVE 12/30/2018 2230   LEUKOCYTESUR TRACE (A) 12/30/2018 2230    Recent Results (from the past 240 hour(s))  Culture, Urine     Status: None   Collection Time: 12/31/18  2:56 AM  Result Value Ref Range Status   Specimen Description   Final    URINE, CLEAN CATCH Performed at [facility], [address] [place], [place] [address]    Special Requests   Final    NONE Performed at [facility], [address], [place], [place] [address]    Culture   Final    NO GROWTH Performed at [facility], [place] [address] [place], [place] [address]    Report Status 01/01/2019 FINAL  Final     Radiology Studies: US Renal  Result Date: 12/31/2018 CLINICAL DATA:  Acute kidney injury EXAM: RENAL / URINARY TRACT ULTRASOUND COMPLETE COMPARISON:  Abdominal  CT 12/31/2018 FINDINGS: Right Kidney: Renal measurements: 10 x 4 x 4 cm = volume: 91 mL. Increased cortical echogenicity with prominent corticomedullary differentiation. No hydronephrosis or mass. Left Kidney: Renal measurements: 9 x 3.9 x 4.4 cm = volume: 93 mL. Increased cortical echogenicity. No hydronephrosis. Bladder: Appears normal for degree of bladder distention. IMPRESSION: Medical renal disease without atrophy or hydronephrosis. Electronically Signed   By: Monte Fantasia M.D.   On: 12/31/2018 11:08   Ct Renal Stone Study  Result Date: 12/31/2018 CLINICAL DATA:  Dysuria. Recent diagnosis of UTI. Fever. Vomiting. EXAM: CT ABDOMEN AND PELVIS WITHOUT CONTRAST TECHNIQUE: Multidetector CT imaging of the abdomen and pelvis was performed following the standard protocol without IV contrast. COMPARISON:  CT scan Feb 12, 2018. FINDINGS: Lower chest: Bilateral dependent atelectasis. The lower chest is otherwise unremarkable. Hepatobiliary:  Previous cholecystectomy. The left hepatic lobe is hypertrophied but stable. No liver masses are noted. Pancreas: Unremarkable. No pancreatic ductal dilatation or surrounding inflammatory changes. Spleen: Previous splenectomy. Adrenals/Urinary Tract: Adrenal glands are normal. No renal stones, masses, or hydronephrosis. No ureteral stones. The bladder is unremarkable. Stomach/Bowel: The stomach and small bowel are normal. Colonic diverticulosis is seen without diverticulitis. The remainder of the colon is normal. The visualized appendix is unremarkable. Vascular/Lymphatic: The abdominal aorta is unremarkable. 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 No adenopathy. Reproductive: Uterus and bilateral adnexa are unremarkable. Other: No abdominal wall hernia or abnormality. No abdominopelvic ascites. Musculoskeletal: Anterior wedging of T10 and T12 is stable. No changes in the visualized bones. IMPRESSION: 1. No cause for the patient's symptoms identified. 2. Colonic diverticulosis without diverticulitis. 3. 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 Electronically Signed   By: Dorise Bullion III M.D   On: 12/31/2018 00:24    Scheduled Meds: . heparin  5,000 Units Subcutaneous Q8H  . isosorbide dinitrate  10 mg Oral BID  . metoprolol tartrate  25 mg Oral BID  . sodium bicarbonate  650 mg Oral BID   Continuous Infusions: . sodium chloride 10 mL/hr at 01/01/19 0312     LOS: 1 day    Time spent: 30 minutes.   Barton Dubois, MD [facility] Pager [phone]   01/01/2019, 12:31 PM

## 2019-01-01 NOTE — Progress Notes (Signed)
Subjective: Interval History: has no complaint, says in making urine, none recorded.  Objective: Vital signs in last 24 hours: Temp:  [97.7 F (36.5 C)-98.8 F (37.1 C)] 98.2 F (36.8 C) (04/05 0509) Pulse Rate:  [63-68] 63 (04/05 0509) Resp:  [18] 18 (04/05 0509) BP: (118-128)/(91-100) 128/93 (04/05 0509) SpO2:  [96 %-100 %] 97 % (04/05 0509) Weight:  [83.4 kg] 83.4 kg (04/05 0509) Weight change: 0.392 kg  Intake/Output from previous day: 04/04 0701 - 04/05 0700 In: 1059.2 [P.O.:240; I.V.:819.2] Out: -  Intake/Output this shift: Total I/O In: 360 [P.O.:360] Out: -   General appearance: alert, cooperative, no distress and pale Resp: clear to auscultation bilaterally Cardio: S1, S2 normal and systolic murmur: systolic ejection 2/6, crescendo and decrescendo at 2nd left intercostal space GI: soft, pos bs nontender Extremities: extremities normal, atraumatic, no cyanosis or edema  Lab Results: Recent Labs    12/30/18 2223 12/31/18 0437  WBC 7.6 6.7  HGB 10.2* 9.3*  HCT 31.8* 29.3*  PLT 154 142*   BMET:  Recent Labs    12/31/18 0437 01/01/19 0505  NA 139 139  K 3.2* 4.0  CL 112* 113*  CO2 16* 16*  GLUCOSE 106* 91  BUN 64* 65*  CREATININE 6.79* 7.13*  CALCIUM 7.9* 7.7*   No results for input(s): PTH in the last 72 hours. Iron Studies:  Recent Labs    12/31/18 1117  IRON 49  TIBC 242*    Studies/Results: US Renal  Result Date: 12/31/2018 CLINICAL DATA:  Acute kidney injury EXAM: RENAL / URINARY TRACT ULTRASOUND COMPLETE COMPARISON:  Abdominal CT 12/31/2018 FINDINGS: Right Kidney: Renal measurements: 10 x 4 x 4 cm = volume: 91 mL. Increased cortical echogenicity with prominent corticomedullary differentiation. No hydronephrosis or mass. Left Kidney: Renal measurements: 9 x 3.9 x 4.4 cm = volume: 93 mL. Increased cortical echogenicity. No hydronephrosis. Bladder: Appears normal for degree of bladder distention. IMPRESSION: Medical renal disease without  atrophy or hydronephrosis. Electronically Signed   By: Monte Fantasia M.D.   On: 12/31/2018 11:08   Ct Renal Stone Study  Result Date: 12/31/2018 CLINICAL DATA:  Dysuria. Recent diagnosis of UTI. Fever. Vomiting. EXAM: CT ABDOMEN AND PELVIS WITHOUT CONTRAST TECHNIQUE: Multidetector CT imaging of the abdomen and pelvis was performed following the standard protocol without IV contrast. COMPARISON:  CT scan Feb 12, 2018. FINDINGS: Lower chest: Bilateral dependent atelectasis. The lower chest is otherwise unremarkable. Hepatobiliary: Previous cholecystectomy. The left hepatic lobe is hypertrophied but stable. No liver masses are noted. Pancreas: Unremarkable. No pancreatic ductal dilatation or surrounding inflammatory changes. Spleen: Previous splenectomy. Adrenals/Urinary Tract: Adrenal glands are normal. No renal stones, masses, or hydronephrosis. No ureteral stones. The bladder is unremarkable. Stomach/Bowel: The stomach and small bowel are normal. Colonic diverticulosis is seen without diverticulitis. The remainder of the colon is normal. The visualized appendix is unremarkable. Vascular/Lymphatic: The abdominal aorta is unremarkable. 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 No adenopathy. Reproductive: Uterus and bilateral adnexa are unremarkable. Other: No abdominal wall hernia or abnormality. No abdominopelvic ascites. Musculoskeletal: Anterior wedging of T10 and T12 is stable. No changes in the visualized bones. IMPRESSION: 1. No cause for the patient's symptoms identified. 2. Colonic diverticulosis without diverticulitis. 3. 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 Electronically Signed   By: Dorise Bullion III M.D   On: 12/31/2018 00:24    I have reviewed the patient's current medications.  Assessment/Plan: 1 AKI/CKD 3 kidneys with some Edison Pace, . ??urine vol.  GFR worse.  Serologies neg,  will check ANCA also.   Suspect Cipro induced AIN. But need to consider AGN.  If not better tomorrow, will set up bx 2 HTN fair control 3 Anemia 4 Hx CAD 5 DJD 6 Asthma P see if can get outputs,  Follow Cr , if not better Biopsy.    LOS: 1 day   Jeneen Rinks Quintus Premo 01/01/2019,11:17 AM

## 2019-01-01 NOTE — Progress Notes (Signed)
Pt c/o nausea asking for medication and crackers. Pt educated on clear liquid diet and that if she is nauseated she should stop eating and drinking. Pt verbalizes understanding and continues to drink her juice and finish her tray. Pt provided with PO medication and educated on timing. Pt stated she was able to take her morning pills. Pt provided with medication and educated to call RN with any further needs/questions. Pt verbalizes understanding. NAD.

## 2019-01-02 ENCOUNTER — Encounter (HOSPITAL_COMMUNITY): Payer: Self-pay | Admitting: *Deleted

## 2019-01-02 LAB — RENAL FUNCTION PANEL
Albumin: 2.7 g/dL — ABNORMAL LOW (ref 3.5–5.0)
Anion gap: 10 (ref 5–15)
BUN: 65 mg/dL — ABNORMAL HIGH (ref 6–20)
CO2: 17 mmol/L — ABNORMAL LOW (ref 22–32)
Calcium: 7.6 mg/dL — ABNORMAL LOW (ref 8.9–10.3)
Chloride: 115 mmol/L — ABNORMAL HIGH (ref 98–111)
Creatinine, Ser: 7.02 mg/dL — ABNORMAL HIGH (ref 0.44–1.00)
GFR calc Af Amer: 7 mL/min — ABNORMAL LOW (ref >60)
GFR calc non Af Amer: 6 mL/min — ABNORMAL LOW (ref >60)
Glucose, Bld: 86 mg/dL (ref 70–99)
Phosphorus: 6.9 mg/dL — ABNORMAL HIGH (ref 2.5–4.6)
Potassium: 4.1 mmol/L (ref 3.5–5.1)
Sodium: 142 mmol/L (ref 135–145)

## 2019-01-02 LAB — PROTEIN / CREATININE RATIO, URINE
Creatinine, Urine: 64.97 mg/dL
Total Protein, Urine: 132 mg/dL

## 2019-01-02 LAB — MPO/PR-3 (ANCA) ANTIBODIES
ANCA Proteinase 3: <3.5 U/mL (ref 0.0–3.5)
Myeloperoxidase Abs: <9 U/mL (ref 0.0–9.0)

## 2019-01-02 LAB — GLOMERULAR BASEMENT MEMBRANE ANTIBODIES: GBM Ab: 2 U (ref 0–20)

## 2019-01-02 MED ORDER — HEPARIN SODIUM (PORCINE) 5000 UNIT/ML IJ SOLN
5000.0000 [IU] | Freq: Three times a day (TID) | INTRAMUSCULAR | Status: AC
  Administered 2019-01-02: 5000 [IU] via SUBCUTANEOUS
  Filled 2019-01-02: qty 1

## 2019-01-02 MED ORDER — SERTRALINE HCL 50 MG PO TABS
100.0000 mg | ORAL_TABLET | Freq: Every day | ORAL | Status: DC
  Administered 2019-01-02 – 2019-01-04 (×2): 100 mg via ORAL
  Filled 2019-01-02 (×3): qty 2

## 2019-01-02 MED ORDER — VENLAFAXINE HCL 37.5 MG PO TABS
37.5000 mg | ORAL_TABLET | Freq: Two times a day (BID) | ORAL | Status: DC
  Administered 2019-01-02 – 2019-01-04 (×3): 37.5 mg via ORAL
  Filled 2019-01-02 (×4): qty 1

## 2019-01-02 MED ORDER — HYDRALAZINE HCL 20 MG/ML IJ SOLN
10.0000 mg | Freq: Four times a day (QID) | INTRAMUSCULAR | Status: DC | PRN
  Administered 2019-01-02: 10 mg via INTRAVENOUS
  Filled 2019-01-02: qty 1

## 2019-01-02 NOTE — Progress Notes (Signed)
PROGRESS NOTE    Summer Cook  HCW:[id number] DOB: April 25, 1966 DOA: 12/30/2018 PCP: Redmond School, MD     Brief Narrative:  53 y.o. female, with a history of hypertension, CAD, GERD, ITP status post splenectomy, history of PE in 2013, came to hospital with 5-day history of nausea and vomiting.  Patient says that she developed dysuria along with nausea vomiting last Friday and was seen by her PCP on Monday.  She was prescribed ciprofloxacin for UTI.  She has been taking it every day.  Patient also was prescribed diltiazem for hypertension, earlier she was taking amlodipine which did not improve her blood pressure. Patient also was given Zofran by her PCP for nausea vomiting which did not help.  She has experienced worsening fatigue, myalgias.  Complains of chest pain denies abdominal pain.  Also complains of headache, in the ED patient was found to be in hypertensive urgency. Denies previous history of seizures. She had stroke in 2017. Also has a history of CAD, medically managed with aspirin, Imdur.  In the ED, lab work revealed creatinine 6.88, patient's baseline creatinine 1.57. Also found to have elevated troponin 0 0.15.   Assessment & Plan: 1-acute on chronic renal failure: Stage III at baseline -With concerns for acute interstitial nephritis Vs AGN -Continue supportive care, follow ANCA results as per nephrology recommendations -No significant improvement in renal function/GFR appreciated; after discussing with nephrology service plan is to pursuit renal biopsy. -Renal ultrasound demonstrated no obstructive uropathy or hydronephrosis. -Continue avoiding nephrotoxic agents. -Continue serum bicarbonate.  2-metabolic acidosis -In the setting of acute renal failure -Continue serum bicarbonate.  3-history of coronary artery disease -No chest pain or shortness of breath -Continue beta-blocker and IMDUR. -Mild elevation in troponin associated with renal failure. -Discontinue  telemetry.  4-hypertension -Fair control -Continue current antihypertensive regimen  -Use as needed hydralazine  5-depression/anxiety -Mood stable -No suicidal ideation or hallucination -After discussing with patient she expressed the use of Zoloft and Effexor at home. -Medications has been resumed.  6-history of asthma -Stable and well-controlled -Continue as needed DuoNeb.  7-hx of ITP -platelets stable -continue to monitor trend   8-hx of stroke -continue risk factor modification -no new focal deficits.  DVT prophylaxis: Heparin Code Status: Full code Family Communication: No family at bedside. Disposition Plan: Advance diet, continue to follow creatinine trend; follow nephrology recommendations.  Plan is to pursuit renal biopsy.  Consultants:   Nephrology  Procedures:   See below for x-ray reports.  Renal ultrasound demonstrating no hydronephrosis, chronic kidney disease changes and no obstructive uropathy.  Antimicrobials:  Anti-infectives (From admission, onward)   Start     Dose/Rate Route Frequency Ordered Stop   12/31/18 0000  ciprofloxacin (CIPRO) IVPB 400 mg     400 mg 200 mL/hr over 60 Minutes Intravenous  Once 12/30/18 2350 12/31/18 0142       Subjective: No fever, no chest pain, no vomiting, no abdominal pain or dysuria.  Reports an improvement in her urine output.  Objective: Vitals:   01/01/19 1500 01/01/19 2016 01/01/19 2115 01/02/19 0556  BP: (!) 138/98  (!) 146/106 (!) 127/97  Pulse: 77  81 70  Resp: 19  19 19   Temp: 98.5 F (36.9 C)  99.1 F (37.3 C) 98.4 F (36.9 C)  TempSrc: Oral  Oral Oral  SpO2: 100% 98% 95% 97%  Weight:    85.5 kg  Height:        Intake/Output Summary (Last 24 hours) at 01/02/2019 1115 Last data  filed at 01/02/2019 0900 Gross per 24 hour  Intake 720 ml  Output 2000 ml  Net -1280 ml   Filed Weights   12/31/18 0246 01/01/19 0509 01/02/19 0556  Weight: 81.7 kg 83.4 kg 85.5 kg    Examination: General  exam: Alert, awake, oriented x 3; reports having headache, no nausea or vomiting today.  Patient denies chest pain or shortness of breath.  Reports an improvement in urine output. Respiratory system: Clear to auscultation. Respiratory effort normal. Cardiovascular system:RRR. No murmurs, rubs, gallops. Gastrointestinal system: Abdomen is nondistended, soft and nontender. No organomegaly or masses felt. Normal bowel sounds heard. Central nervous system: Alert and oriented. No focal neurological deficits. Extremities: No C/C/E, +pedal pulses Skin: No rashes, lesions or ulcers Psychiatry: Judgement and insight appear normal. Mood & affect appropriate.    Data Reviewed: I have personally reviewed following labs and imaging studies  CBC: Recent Labs  Lab 12/30/18 2223 12/31/18 0437  WBC 7.6 6.7  NEUTROABS 4.3  --   HGB 10.2* 9.3*  HCT 31.8* 29.3*  MCV 88.1 88.0  PLT 154 382*   Basic Metabolic Panel: Recent Labs  Lab 12/30/18 2223 12/31/18 0437 01/01/19 0505 01/02/19 0427  NA 140 139 139 142  K 3.7 3.2* 4.0 4.1  CL 110 112* 113* 115*  CO2 16* 16* 16* 17*  GLUCOSE 91 106* 91 86  BUN 72* 64* 65* 65*  CREATININE 6.88* 6.79* 7.13* 7.02*  CALCIUM 8.7* 7.9* 7.7* 7.6*  PHOS  --   --  6.7* 6.9*   GFR: Estimated Creatinine Clearance: 10 mL/min (A) (by C-G formula based on SCr of 7.02 mg/dL (H)).   Liver Function Tests: Recent Labs  Lab 12/30/18 2223 12/31/18 0437 01/01/19 0505 01/02/19 0427  AST 18 15  --   --   ALT 12 9  --   --   ALKPHOS 64 58  --   --   BILITOT 0.5 0.5  --   --   PROT 6.8 6.0*  --   --   ALBUMIN 3.5 3.0* 2.8* 2.7*   Recent Labs  Lab 12/30/18 2223  LIPASE 58*   Cardiac Enzymes: Recent Labs  Lab 12/30/18 2223 12/31/18 0437 12/31/18 1117 12/31/18 1443  TROPONINI 0.15* 0.12* 0.11* 0.10*   Anemia Panel: Recent Labs    12/31/18 1117  TIBC 242*  IRON 49   Urine analysis:    Component Value Date/Time   COLORURINE STRAW (A) 12/30/2018 2230    APPEARANCEUR CLEAR 12/30/2018 2230   LABSPEC 1.010 12/30/2018 2230   PHURINE 6.0 12/30/2018 2230   GLUCOSEU NEGATIVE 12/30/2018 2230   HGBUR MODERATE (A) 12/30/2018 2230   BILIRUBINUR NEGATIVE 12/30/2018 2230   KETONESUR 5 (A) 12/30/2018 2230   PROTEINUR 100 (A) 12/30/2018 2230   UROBILINOGEN 0.2 02/17/2015 1325   NITRITE NEGATIVE 12/30/2018 2230   LEUKOCYTESUR TRACE (A) 12/30/2018 2230    Recent Results (from the past 240 hour(s))  Culture, Urine     Status: None   Collection Time: 12/31/18  2:56 AM  Result Value Ref Range Status   Specimen Description   Final    URINE, CLEAN CATCH Performed at [facility], [address], [place], [place] [address]    Special Requests   Final    NONE Performed at [facility], [address] [place], [place] [address]    Culture   Final    NO GROWTH Performed at [facility], [place] [address], [place], [place] [address]  Report Status 01/01/2019 FINAL  Final     Radiology Studies: No results found.  Scheduled Meds: . heparin  5,000 Units Subcutaneous Q8H  . isosorbide dinitrate  10 mg Oral BID  . metoprolol tartrate  25 mg Oral BID  . sertraline  100 mg Oral Daily  . sodium bicarbonate  650 mg Oral BID  . venlafaxine  37.5 mg Oral BID WC   Continuous Infusions: . sodium chloride 10 mL/hr at 01/01/19 0312     LOS: 2 days    Time spent: 30 minutes.   Barton Dubois, MD [facility] Pager [phone]   01/02/2019, 11:15 AM

## 2019-01-02 NOTE — Progress Notes (Signed)
Aware of IR request for possible image-guided random renal biopsy.  Procedure has been approved by Dr. Vernard Gambles and is tentative for 01/03/2019 with transfer to and from [facility]. Patient will be NPO at midnight. Heparin held at midnight. [facility] RN aware of above orders and to set up carelink to arrive by 9am. Will consent patient in IR tomorrow once arrives.  Please call IR with questions/concerns.  Bea Graff Laelyn Blumenthal, PA-C 01/02/2019, 11:23 AM

## 2019-01-02 NOTE — Progress Notes (Signed)
Admit: 12/30/2018 LOS: 2  60F with acute on chronic renal failure, pyuria, hematuria, after ciprofloxacin exposure  Subjective:  . No complaints, feels well . Excellent urine output . Again, denies rashes, arthralgias . Serum creatinine stable at 7.0, potassium 4.1, bicarbonate 17 with anion gap of 10. . C3 and C4 are normal, anti-GBM negative, HIV negative, urine culture no growth, ASO negative . ANCA, HCV, HBV, ANA pending . Discussed that need renal biopsy for diagnostic and therapeutic considerations  04/05 0701 - 04/06 0700 In: 840 [P.O.:840] Out: 2000 [Urine:2000]  Filed Weights   12/31/18 0246 01/01/19 0509 01/02/19 0556  Weight: 81.7 kg 83.4 kg 85.5 kg    Scheduled Meds: . heparin  5,000 Units Subcutaneous Q8H  . isosorbide dinitrate  10 mg Oral BID  . metoprolol tartrate  25 mg Oral BID  . sertraline  100 mg Oral Daily  . sodium bicarbonate  650 mg Oral BID  . venlafaxine  37.5 mg Oral BID WC   Continuous Infusions: . sodium chloride 10 mL/hr at 01/01/19 0312   PRN Meds:.acetaminophen **OR** acetaminophen, fentaNYL (SUBLIMAZE) injection, hydrALAZINE, ipratropium-albuterol, ondansetron **OR** ondansetron (ZOFRAN) IV  Current Labs: reviewed    Physical Exam:  Blood pressure (!) 127/97, pulse 70, temperature 98.4 F (36.9 C), temperature source Oral, resp. rate 19, height 5\' 5"  (1.651 m), weight 85.5 kg, last menstrual period 08/13/2011, SpO2 97 %. NAD, well-appearing, conversant RRR, normal S1 and S2 Clear bilaterally, normal work of breathing Obese, soft, non-distended, nontender No lower extremity edema No rashes or lesions Nonfocal, CN II through XII intact EOMI, NCAT  A 1. Nonoliguric acute on chronic renal insufficiency, baseline creatinine 1.5, after exposure to ciprofloxacin with hematuria and pyuria; today serological and infectious work-up negative; 12/31/2018 renal ultrasound without obstruction or structural etiology 2. CAD 3. History of ITP,  platelet count greater than 100; status post splenectomy 4. Mild anemia, normocytic, Tsat 20% 5. Recent exposure to ciprofloxacin to treat possible UTI, urine culture at admission negative 6. NAG metabolic acidosis on NaHCO3  P . We will arrange for renal biopsy with interventional radiology, hopefully tomorrow . Hold heparin after evening dose tonight . No medication changes at the current time . We will follow along closely . I think can come back to anything hospital after biopsy, if needs to start dialysis have resources to initiate here . Medication Issues; o Preferred narcotic agents for pain control are hydromorphone, fentanyl, and methadone. Morphine should not be used.  o Baclofen should be avoided o Avoid oral sodium phosphate and magnesium citrate based laxatives / bowel preps    Pearson Grippe MD 01/02/2019, 11:15 AM  Recent Labs  Lab 12/31/18 0437 01/01/19 0505 01/02/19 0427  NA 139 139 142  K 3.2* 4.0 4.1  CL 112* 113* 115*  CO2 16* 16* 17*  GLUCOSE 106* 91 86  BUN 64* 65* 65*  CREATININE 6.79* 7.13* 7.02*  CALCIUM 7.9* 7.7* 7.6*  PHOS  --  6.7* 6.9*   Recent Labs  Lab 12/30/18 2223 12/31/18 0437  WBC 7.6 6.7  NEUTROABS 4.3  --   HGB 10.2* 9.3*  HCT 31.8* 29.3*  MCV 88.1 88.0  PLT 154 142*

## 2019-01-03 ENCOUNTER — Ambulatory Visit (HOSPITAL_COMMUNITY)
Admission: RE | Admit: 2019-01-03 | Discharge: 2019-01-03 | Disposition: A | Discharge status: Home / Self Care | Payer: Medicare Other | Source: Ambulatory Visit | Attending: Nephrology | Admitting: Nephrology

## 2019-01-03 DIAGNOSIS — I252 Old myocardial infarction: Secondary | ICD-10-CM | POA: Insufficient documentation

## 2019-01-03 DIAGNOSIS — F329 Major depressive disorder, single episode, unspecified: Secondary | ICD-10-CM | POA: Insufficient documentation

## 2019-01-03 DIAGNOSIS — Z791 Long term (current) use of non-steroidal anti-inflammatories (NSAID): Secondary | ICD-10-CM | POA: Insufficient documentation

## 2019-01-03 DIAGNOSIS — Z880 Allergy status to penicillin: Secondary | ICD-10-CM | POA: Insufficient documentation

## 2019-01-03 DIAGNOSIS — Z87891 Personal history of nicotine dependence: Secondary | ICD-10-CM | POA: Insufficient documentation

## 2019-01-03 DIAGNOSIS — N179 Acute kidney failure, unspecified: Secondary | ICD-10-CM

## 2019-01-03 DIAGNOSIS — I2510 Atherosclerotic heart disease of native coronary artery without angina pectoris: Secondary | ICD-10-CM

## 2019-01-03 DIAGNOSIS — Z792 Long term (current) use of antibiotics: Secondary | ICD-10-CM

## 2019-01-03 DIAGNOSIS — Z882 Allergy status to sulfonamides status: Secondary | ICD-10-CM | POA: Insufficient documentation

## 2019-01-03 DIAGNOSIS — E119 Type 2 diabetes mellitus without complications: Secondary | ICD-10-CM | POA: Insufficient documentation

## 2019-01-03 DIAGNOSIS — Z801 Family history of malignant neoplasm of trachea, bronchus and lung: Secondary | ICD-10-CM

## 2019-01-03 DIAGNOSIS — F419 Anxiety disorder, unspecified: Secondary | ICD-10-CM

## 2019-01-03 DIAGNOSIS — J45909 Unspecified asthma, uncomplicated: Secondary | ICD-10-CM | POA: Insufficient documentation

## 2019-01-03 DIAGNOSIS — I10 Essential (primary) hypertension: Secondary | ICD-10-CM

## 2019-01-03 DIAGNOSIS — Z888 Allergy status to other drugs, medicaments and biological substances status: Secondary | ICD-10-CM

## 2019-01-03 DIAGNOSIS — Z803 Family history of malignant neoplasm of breast: Secondary | ICD-10-CM | POA: Insufficient documentation

## 2019-01-03 DIAGNOSIS — Z91038 Other insect allergy status: Secondary | ICD-10-CM | POA: Insufficient documentation

## 2019-01-03 DIAGNOSIS — Z86711 Personal history of pulmonary embolism: Secondary | ICD-10-CM

## 2019-01-03 DIAGNOSIS — Z800 Family history of malignant neoplasm of digestive organs: Secondary | ICD-10-CM | POA: Insufficient documentation

## 2019-01-03 DIAGNOSIS — Z79899 Other long term (current) drug therapy: Secondary | ICD-10-CM | POA: Insufficient documentation

## 2019-01-03 DIAGNOSIS — Z9049 Acquired absence of other specified parts of digestive tract: Secondary | ICD-10-CM

## 2019-01-03 LAB — RENAL FUNCTION PANEL
Albumin: 2.7 g/dL — ABNORMAL LOW (ref 3.5–5.0)
Anion gap: 10 (ref 5–15)
BUN: 69 mg/dL — ABNORMAL HIGH (ref 6–20)
CO2: 17 mmol/L — ABNORMAL LOW (ref 22–32)
Calcium: 7.8 mg/dL — ABNORMAL LOW (ref 8.9–10.3)
Chloride: 113 mmol/L — ABNORMAL HIGH (ref 98–111)
Creatinine, Ser: 6.95 mg/dL — ABNORMAL HIGH (ref 0.44–1.00)
GFR calc Af Amer: 7 mL/min — ABNORMAL LOW (ref >60)
GFR calc non Af Amer: 6 mL/min — ABNORMAL LOW (ref >60)
Glucose, Bld: 89 mg/dL (ref 70–99)
Phosphorus: 6.7 mg/dL — ABNORMAL HIGH (ref 2.5–4.6)
Potassium: 3.6 mmol/L (ref 3.5–5.1)
Sodium: 140 mmol/L (ref 135–145)

## 2019-01-03 LAB — HEPATITIS B SURFACE ANTIGEN: Hepatitis B Surface Ag: NEGATIVE

## 2019-01-03 LAB — GLUCOSE, CAPILLARY: Glucose-Capillary: 71 mg/dL (ref 70–99)

## 2019-01-03 MED ORDER — HYDRALAZINE HCL 20 MG/ML IJ SOLN
5.0000 mg | Freq: Once | INTRAMUSCULAR | Status: AC
  Administered 2019-01-03: 5 mg via INTRAVENOUS

## 2019-01-03 MED ORDER — FENTANYL CITRATE (PF) 100 MCG/2ML IJ SOLN
25.0000 ug | Freq: Four times a day (QID) | INTRAMUSCULAR | Status: DC | PRN
  Administered 2019-01-03 – 2019-01-04 (×2): 25 ug via INTRAVENOUS
  Filled 2019-01-03 (×4): qty 2

## 2019-01-03 MED ORDER — LABETALOL HCL 5 MG/ML IV SOLN
5.0000 mg | Freq: Once | INTRAVENOUS | Status: AC
  Administered 2019-01-03: 5 mg via INTRAVENOUS

## 2019-01-03 MED ORDER — LIDOCAINE HCL (PF) 1 % IJ SOLN
INTRAMUSCULAR | Status: AC
  Filled 2019-01-03: qty 30

## 2019-01-03 MED ORDER — HYDRALAZINE HCL 20 MG/ML IJ SOLN
INTRAMUSCULAR | Status: AC | PRN
  Administered 2019-01-03: 5 mg via INTRAVENOUS

## 2019-01-03 MED ORDER — LABETALOL HCL 5 MG/ML IV SOLN
INTRAVENOUS | Status: AC
  Filled 2019-01-03: qty 4

## 2019-01-03 MED ORDER — HYDROCODONE-ACETAMINOPHEN 5-325 MG PO TABS: 1.0000 | ORAL_TABLET | ORAL | Status: DC | PRN

## 2019-01-03 MED ORDER — FENTANYL CITRATE (PF) 100 MCG/2ML IJ SOLN
INTRAMUSCULAR | Status: AC | PRN
  Administered 2019-01-03: 50 ug via INTRAVENOUS

## 2019-01-03 MED ORDER — HYDRALAZINE HCL 20 MG/ML IJ SOLN
INTRAMUSCULAR | Status: AC
  Filled 2019-01-03: qty 1

## 2019-01-03 MED ORDER — FENTANYL CITRATE (PF) 100 MCG/2ML IJ SOLN
25.0000 ug | Freq: Once | INTRAMUSCULAR | Status: AC
  Administered 2019-01-03: 25 ug via INTRAVENOUS

## 2019-01-03 MED ORDER — FENTANYL CITRATE (PF) 100 MCG/2ML IJ SOLN
INTRAMUSCULAR | Status: AC
  Filled 2019-01-03: qty 2

## 2019-01-03 MED ORDER — MIDAZOLAM HCL 2 MG/2ML IJ SOLN
INTRAMUSCULAR | Status: AC | PRN
  Administered 2019-01-03: 1 mg via INTRAVENOUS

## 2019-01-03 MED ORDER — MIDAZOLAM HCL 2 MG/2ML IJ SOLN
INTRAMUSCULAR | Status: AC
  Filled 2019-01-03: qty 2

## 2019-01-03 NOTE — Sedation Documentation (Signed)
Dr. Jarvis Newcomer informed of patients blood pressure, 138/99 (x2 readings). Verbal order obtained to give Labetalol 5 mg IVP now.

## 2019-01-03 NOTE — Sedation Documentation (Signed)
Patient is resting comfortably. 

## 2019-01-03 NOTE — Progress Notes (Signed)
PROGRESS NOTE    INFINITY JEFFORDS  HWE:[id number] DOB: 06/13/1966 DOA: 12/30/2018 PCP: Redmond School, MD     Brief Narrative:  53 y.o. female, with a history of hypertension, CAD, GERD, ITP status post splenectomy, history of PE in 2013, came to hospital with 5-day history of nausea and vomiting.  Patient says that she developed dysuria along with nausea vomiting last Friday and was seen by her PCP on Monday.  She was prescribed ciprofloxacin for UTI.  She has been taking it every day.  Patient also was prescribed diltiazem for hypertension, earlier she was taking amlodipine which did not improve her blood pressure. Patient also was given Zofran by her PCP for nausea vomiting which did not help.  She has experienced worsening fatigue, myalgias.  Complains of chest pain denies abdominal pain.  Also complains of headache, in the ED patient was found to be in hypertensive urgency. Denies previous history of seizures. She had stroke in 2017. Also has a history of CAD, medically managed with aspirin, Imdur.  In the ED, lab work revealed creatinine 6.88, patient's baseline creatinine 1.57. Also found to have elevated troponin 0 0.15.   Assessment & Plan: 1-acute on chronic renal failure: Stage III at baseline -With concerns for acute interstitial nephritis Vs AGN -Continue supportive care and follow-up renal service recommendations. -Renal biopsy has been performed earlier today; pending results. -Creatinine 6.95 -Renal ultrasound demonstrated no obstructive uropathy or hydronephrosis. -Continue avoiding nephrotoxic agents. -Continue serum bicarbonate by mouth; CO2 17.  2-metabolic acidosis -In the setting of acute renal failure -Continue serum bicarbonate.  3-history of coronary artery disease -No chest pain or shortness of breath -Continue beta-blocker and IMDUR. -Mild elevation in troponin associated with renal failure. -Discontinue telemetry.  4-hypertension -Fair  control -Continue current antihypertensive regimen  -Use as needed hydralazine  5-depression/anxiety -Mood stable -No suicidal ideation or hallucination -After discussing with patient she expressed the use of Zoloft and Effexor at home. -Medications has been resumed.  6-history of asthma -Stable and well-controlled -Continue as needed DuoNeb.  7-hx of ITP -platelets stable -continue to monitor trend   8-hx of stroke -continue risk factor modification -no new focal deficits.  DVT prophylaxis: Heparin Code Status: Full code Family Communication: No family at bedside. Disposition Plan: Follow biopsy results, continue to follow renal function trend.  Follow nephrology recommendations.  Consultants:   Nephrology  Procedures:   See below for x-ray reports.  Renal ultrasound demonstrating no hydronephrosis, chronic kidney disease changes and no obstructive uropathy.  Renal biopsy 01/03/2019  Antimicrobials:  Anti-infectives (From admission, onward)   Start     Dose/Rate Route Frequency Ordered Stop   12/31/18 0000  ciprofloxacin (CIPRO) IVPB 400 mg     400 mg 200 mL/hr over 60 Minutes Intravenous  Once 12/30/18 2350 12/31/18 0142     Subjective: No fever, no chest pain, no vomiting, no dysuria, no shortness of breath.  Status post renal biopsy.  Reports good urine output.  Objective: Vitals:   01/03/19 1031 01/03/19 1034 01/03/19 1043 01/03/19 1312  BP: (!) 137/92 (!) 137/92 134/88 116/75  Pulse: 71  76 71  Resp:    18  Temp:    98.4 F (36.9 C)  TempSrc:    Oral  SpO2:    99%  Weight:      Height:        Intake/Output Summary (Last 24 hours) at 01/03/2019 1721 Last data filed at 01/03/2019 1444 Gross per 24 hour  Intake 157.67 ml  Output -  Net 157.67 ml   Filed Weights   01/02/19 0556 01/02/19 1438 01/03/19 0529  Weight: 85.5 kg 92 kg 84.6 kg    Examination: General exam: Alert, awake, oriented x 3; patient reports having headache.  No nausea or  vomiting.  Status post renal biopsy (albeit all well-tolerated).  Brief episode of hypotension and bradycardia after procedure, responded appropriately to fluid resuscitation. Respiratory system: Clear to auscultation. Respiratory effort normal. Cardiovascular system:RRR. No murmurs, rubs, gallops. Gastrointestinal system: Abdomen is nondistended, soft and nontender. No organomegaly or masses felt. Normal bowel sounds heard. Central nervous system: Alert and oriented. No focal neurological deficits. Extremities: No C/C/E, +pedal pulses Skin: No rashes, lesions or ulcers Psychiatry: Judgement and insight appear normal. Mood & affect appropriate.    Data Reviewed: I have personally reviewed following labs and imaging studies  CBC: Recent Labs  Lab 12/30/18 2223 12/31/18 0437  WBC 7.6 6.7  NEUTROABS 4.3  --   HGB 10.2* 9.3*  HCT 31.8* 29.3*  MCV 88.1 88.0  PLT 154 034*   Basic Metabolic Panel: Recent Labs  Lab 12/30/18 2223 12/31/18 0437 01/01/19 0505 01/02/19 0427 01/03/19 0452  NA 140 139 139 142 140  K 3.7 3.2* 4.0 4.1 3.6  CL 110 112* 113* 115* 113*  CO2 16* 16* 16* 17* 17*  GLUCOSE 91 106* 91 86 89  BUN 72* 64* 65* 65* 69*  CREATININE 6.88* 6.79* 7.13* 7.02* 6.95*  CALCIUM 8.7* 7.9* 7.7* 7.6* 7.8*  PHOS  --   --  6.7* 6.9* 6.7*   GFR: Estimated Creatinine Clearance: 10 mL/min (A) (by C-G formula based on SCr of 6.95 mg/dL (H)).   Liver Function Tests: Recent Labs  Lab 12/30/18 2223 12/31/18 0437 01/01/19 0505 01/02/19 0427 01/03/19 0452  AST 18 15  --   --   --   ALT 12 9  --   --   --   ALKPHOS 64 58  --   --   --   BILITOT 0.5 0.5  --   --   --   PROT 6.8 6.0*  --   --   --   ALBUMIN 3.5 3.0* 2.8* 2.7* 2.7*   Recent Labs  Lab 12/30/18 2223  LIPASE 58*   Cardiac Enzymes: Recent Labs  Lab 12/30/18 2223 12/31/18 0437 12/31/18 1117 12/31/18 1443  TROPONINI 0.15* 0.12* 0.11* 0.10*   Urine analysis:    Component Value Date/Time   COLORURINE  STRAW (A) 12/30/2018 2230   APPEARANCEUR CLEAR 12/30/2018 2230   LABSPEC 1.010 12/30/2018 2230   PHURINE 6.0 12/30/2018 2230   GLUCOSEU NEGATIVE 12/30/2018 2230   HGBUR MODERATE (A) 12/30/2018 2230   BILIRUBINUR NEGATIVE 12/30/2018 2230   KETONESUR 5 (A) 12/30/2018 2230   PROTEINUR 100 (A) 12/30/2018 2230   UROBILINOGEN 0.2 02/17/2015 1325   NITRITE NEGATIVE 12/30/2018 2230   LEUKOCYTESUR TRACE (A) 12/30/2018 2230    Recent Results (from the past 240 hour(s))  Culture, Urine     Status: None   Collection Time: 12/31/18  2:56 AM  Result Value Ref Range Status   Specimen Description   Final    URINE, CLEAN CATCH Performed at [facility], [address], [place], [place] [address]    Special Requests   Final    NONE Performed at [facility], [address] [place], [place] [address]    Culture   Final    NO GROWTH Performed at [facility], [place] [address],  [place], [place] [address]    Report Status 01/01/2019 FINAL  Final     Radiology Studies: US Biopsy (kidney)  Result Date: 01/03/2019 CLINICAL DATA:  Acute renal failure of unknown etiology EXAM: ULTRASOUND GUIDED RENAL CORE BIOPSY COMPARISON:  None TECHNIQUE: The procedure, risks (including but not limited to bleeding, infection, organ damage ), benefits, and alternatives were explained to the patient. Questions regarding the procedure were encouraged and answered. The patient understands and consents to the procedure. Survey ultrasound was performed and an appropriate skin entry site was localized. Site was marked, prepped with Betadine, draped in usual sterile fashion, infiltrated locally with 1% lidocaine. Intravenous Fentanyl 33mcg and Versed 1mg  were administered as conscious sedation during continuous monitoring of the patient's level of consciousness and physiological / cardiorespiratory status by the radiology RN, with a total moderate sedation time of 12 minutes. Under real time ultrasound guidance, a 15 gauge  trocar needle was advanced to the margin of the lower pole of the left kidney for 3 coaxial 16 gauge core biopsy needle passes. The core samples were submitted to pathology. The patient tolerated procedure well. COMPLICATIONS: None immediate IMPRESSION: 1. Technically successful ultrasound-guided core renal biopsy , left lower pole. Electronically Signed   By: Lucrezia Europe M.D.   On: 01/03/2019 13:34    Scheduled Meds: . isosorbide dinitrate  10 mg Oral BID  . metoprolol tartrate  25 mg Oral BID  . sertraline  100 mg Oral Daily  . sodium bicarbonate  650 mg Oral BID  . venlafaxine  37.5 mg Oral BID WC   Continuous Infusions: . sodium chloride 10 mL/hr at 01/01/19 0312     LOS: 3 days    Time spent: 30 minutes.   Barton Dubois, MD [facility] Pager [phone]   01/03/2019, 5:21 PM

## 2019-01-03 NOTE — Procedures (Signed)
  Procedure: [place] core LLP renal 16g x3 EBL:   minimal Complications:  none immediate  See full dictation in [facility].  Dillard Cannon MD Main # [phone] Pager  [phone]

## 2019-01-03 NOTE — Significant Event (Addendum)
Rapid Response Event Note  Overview:  Called by recovery nurse to IR Patient Support, patient post left kidney biopsy procedure, hypotensive and bradycardic.  Initial Focused Assessment: -- Arrived to room, patient with trendelenburg, IVF NS KVO open to gravity, pale but warm and dry skin.  Sinus rhythm 50-60s on monitor, SBP 70. Patient alert and oriented, no complaints of pain, reports nausea.  Per RN patient became hypotensive and bradycardic after turning from prone to supine post procedure.  Interventions: -- Another PIV started by IR RN for better access for fluids.  On assessment, no signs of bleeding, no abd tenderness, no back pain, no dyspnea.  Biopsy site on back checked, no bleeding from site, no discoloration of skin.  -- Returned patient to supine. Nausea improving, BP trending up, SBP >110, HR 60-70s -- Procedural MD to see Pt -- CBG check - 71.  Offered sips of soda, patient tolerated well.    Plan of Care (if not transferred): -- Patient plan to be transported back to [facility] via [facility], [facility] at bedside after RR RN arrival.   -- MD seen, patient recovered and states "feels much better", ok for [facility] to transport.  Event Summary: Start Summer  Summer Cook, Summer Cook

## 2019-01-03 NOTE — Sedation Documentation (Signed)
Rapid response here assessing patient.

## 2019-01-03 NOTE — Sedation Documentation (Signed)
Dr Vernard Gambles at bedside at this time.

## 2019-01-03 NOTE — Sedation Documentation (Signed)
Dr. Vernard Gambles cleared patient to go back to [facility]. Patient agreeable.

## 2019-01-03 NOTE — Sedation Documentation (Signed)
[facility] here for transport. Report given to [place], Therapist, sports.

## 2019-01-03 NOTE — Consult Note (Signed)
Chief Complaint: Patient was seen in consultation today for AKI/random renal biopsy.  Referring Physician(s): Sanford,Ryan B  Supervising Physician: Arne Cleveland  Patient Status: [facility] - In-pt  History of Present Illness: Summer Cook is a 53 y.o. female with a past medical history of hypertension, CAD, MI, ITP, CVA, pulmonary embolism 2013, asthma, GERD, hemorrhoids, renal insufficiency, diabetes mellitus, gout, depression, and anxiety. She presented to [facility] ED 12/30/2018 with complaint of N/V x 5 days. In ED, patient was found to have creatinine 6.88 (baseline 1.57) along with elevated troponin 0.15. Patient was admitted for further evaluation. Nephrology was consulted who recommended IR consultation for random renal biopsy to evaluate cause of AKI.  IR requested by Dr. Joelyn Oms for possible image-guided random renal biopsy. Patient awake and alert laying in bed. Complains of headache. States it is because she hasn't had caffeine today. Denies fever, chills, chest pain, dyspnea, or abdominal pain.  Patient is currently receiving Heparin SQ injections Q8H- last dose 01/02/2019 at 1318.   Past Medical History:  Diagnosis Date  . Allergy   . Anxiety   . Asthma   . CAD (coronary artery disease), native coronary artery    a. cath 05/20/17 - 100% distal PDA --> medical mangement (no intervention done)  . Candida esophagitis ([place])   . Chest pain at rest 04/2017  . Depression   . Essential hypertension, benign   . GERD (gastroesophageal reflux disease)   . Gout   . Hemorrhoids   . 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  Last was Prednisone '40mg'$ ; 2 wk in 10/2012.  She also was on Prednisone bridged with Cellcept briefly but stopped due to lack of insurance.   . Myocardial infarction ([place])   . Pulmonary embolism ([place]) 04/2012  . Renal insufficiency   . Serrated adenoma of  colon   . Steroid-induced diabetes ([place])   . Stroke ([place])   . Thrush, oral 05/29/2011    Past Surgical History:  Procedure Laterality Date  . BONE MARROW BIOPSY    . CARDIAC CATHETERIZATION    . CARPAL TUNNEL RELEASE    . CARPAL TUNNEL RELEASE Left 05/20/2016   Procedure: CARPAL TUNNEL RELEASE;  Surgeon: Carole Civil, MD;  Location: [facility];  Service: Orthopedics;  Laterality: Left;  . CARPAL TUNNEL RELEASE Right 07/16/2016   Procedure: RIGHT CARPAL TUNNEL RELEASE;  Surgeon: Carole Civil, MD;  Location: [facility];  Service: Orthopedics;  Laterality: Right;  . CATARACT EXTRACTION    . CHOLECYSTECTOMY  2008  . COLONOSCOPY WITH ESOPHAGOGASTRODUODENOSCOPY (EGD) N/A 01/04/2013   Dr. Gala Romney: esophageal plaques with +KOH, hh. Gastric antrum abnormal, bx reactive gastropathy. Anal canal hemorrhoids, colonic tics, normal TI, single polyp (sessile serrated tubular adenoma). Next TCS 12/2017  . LEFT HEART CATH AND CORONARY ANGIOGRAPHY N/A 05/20/2017   Procedure: LEFT HEART CATH AND CORONARY ANGIOGRAPHY;  Surgeon: Martinique, Peter M, MD;  Location: [facility];  Service: Cardiovascular;  Laterality: N/A;  . 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 TEE WITHOUT CARDIOVERSION N/A 01/03/2016   Procedure: TRANSESOPHAGEAL ECHOCARDIOGRAM (TEE) WITH PROPOFOL;  Surgeon: Satira Sark, MD;  Location: [facility];  Service: Cardiovascular;  Laterality: N/A;    Allergies: Brintellix [vortioxetine]; Penicillins; Cymbalta [duloxetine hcl]; Yellow jacket venom; Adhesive [tape]; and Zoloft [sertraline hcl]  Medications: Prior to Admission medications   Medication Sig Start Date End Date Taking? Authorizing Provider  ALPRAZolam Duanne Moron) 0.5 MG tablet  Take 1 tablet by mouth 3 (three) times daily.  11/02/17   [provider]  aspirin EC 81 MG EC tablet Take 1 tablet (81 mg total) by mouth daily. Patient not taking: Reported on 12/31/2018 05/23/17   Eileen Stanford, PA-C  azithromycin (ZITHROMAX) 250 MG  tablet Take 250-500 mg by mouth See admin instructions. Starting on 06/20/2018 take '500mg'$  on day 1, then take '250mg'$  on days 2 through 5 06/20/18   [provider]  ciprofloxacin (CIPRO) 500 MG tablet Take 500 mg by mouth daily with breakfast.    [provider]  diclofenac sodium (VOLTAREN) 1 % GEL Apply 2 g topically 4 (four) times daily. 06/06/17   Debbe Odea, MD  diltiazem (CARDIZEM) 120 MG tablet Take 120 mg by mouth daily.    [provider]  HYDROcodone-acetaminophen (NORCO) 10-325 MG tablet Take 1 tablet by mouth every 4 (four) hours as needed. 11/02/17   [provider]  ipratropium-albuterol (DUONEB) 0.5-2.5 (3) MG/3ML SOLN Take 3 mLs by nebulization 3 (three) times daily as needed (shorntess of breath/wheezing). Patient taking differently: Take 3 mLs by nebulization 3 (three) times daily as needed (shortness of breath/wheezing).  02/18/15   Kathie Dike, MD  isosorbide dinitrate (ISORDIL) 10 MG tablet Take 10 mg by mouth daily.     [provider]  nitroGLYCERIN (NITROSTAT) 0.4 MG SL tablet Place 1 tablet (0.4 mg total) under the tongue every 5 (five) minutes x 3 doses as needed for chest pain. 05/25/17   Delos Haring, PA-C  Oxycodone HCl 10 MG TABS Take 10 mg by mouth every 4 (four) hours as needed (for pain).  06/03/18   [provider]  PROAIR RESPICLICK 431 (90 BASE) MCG/ACT AEPB Inhale 2 puffs into the lungs every 4 (four) hours as needed (Shortness of breath).  12/27/14   [provider]  sertraline (ZOLOFT) 100 MG tablet Take 1 tablet by mouth at bedtime. 10/11/18   [provider]  prochlorperazine (COMPAZINE) 10 MG tablet Take 10 mg by mouth as needed. For nausea   12/18/11  [provider]     Family History  Problem Relation Age of Onset  . Cervical cancer Mother   . Lung cancer Father   . Colon polyps Father        ???  . Pneumonia Brother   . Colon cancer Paternal Grandmother 88  . AAA  (abdominal aortic aneurysm) Paternal Grandmother   . Breast cancer Paternal Grandmother   . Colon cancer Paternal Grandfather 62  . Barrett's esophagus Paternal Aunt   . Stomach cancer Neg Hx   . Pancreatic cancer Neg Hx     Social History   Socioeconomic History  . Marital status: Divorced    Spouse name: Not on file  . Number of children: 1  . Years of education: Not on file  . 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   Social Needs  . Financial resource strain: Not hard at all  . Food insecurity:    Worry: Sometimes true    Inability: Sometimes true  . Transportation needs:    Medical: No    Non-medical: No  Tobacco Use  . Smoking status: Former Smoker    Years: 0.00    Types: Cigarettes    Last attempt to quit: 02/14/2009    Years since quitting: 9.8  . Smokeless tobacco: Never Used  Substance and Sexual Activity  . Alcohol use: No    Alcohol/week: 0.0 standard drinks  . Drug use: No  . Sexual activity: Never  Lifestyle  . Physical activity:    Days per week: 2 days    Minutes per session: 30 min  . Stress: Rather much  Relationships  . Social connections:    Talks on phone: More than three times a week    Gets together: More than three times a week    Attends religious service: Never    Active member of club or organization: No    Attends meetings of clubs or organizations: Never    Relationship status: Divorced  Other Topics Concern  . Not on file  Social History Narrative   Lives with her daughter and aunt in a one story home.  Has 1 daughter.  On disability.  Did work as a Optometrist and bus Geophysicist/field seismologist.  Education: some college.      Review of Systems: A 12 point ROS discussed and pertinent positives are indicated in the HPI above.  All other systems are negative.  Review of Systems  Constitutional: Negative for chills and fever.  Respiratory: Negative for  shortness of breath and wheezing.   Cardiovascular: Negative for chest pain and palpitations.  Gastrointestinal: Negative for abdominal pain.  Neurological: Positive for headaches.  Psychiatric/Behavioral: Negative for behavioral problems and confusion.    Vital Signs: LMP 08/13/2011   Physical Exam Vitals signs and nursing note reviewed.  Constitutional:      General: She is not in acute distress.    Appearance: Normal appearance.  Cardiovascular:     Rate and Rhythm: Normal rate and regular rhythm.     Heart sounds: Normal heart sounds. No murmur.  Pulmonary:     Effort: Pulmonary effort is normal. No respiratory distress.     Breath sounds: Normal breath sounds. No wheezing.  Skin:    General: Skin is warm and dry.  Neurological:     Mental Status: She is alert and oriented to person, place, and time.  Psychiatric:        Mood and Affect: Mood normal.        Behavior: Behavior normal.        Thought Content: Thought content normal.        Judgment: Judgment normal.      MD Evaluation Airway: WNL Heart: WNL Abdomen: WNL Chest/ Lungs: WNL ASA  Classification: 3 Mallampati/Airway Score: Two   Imaging: US Renal  Result Date: 12/31/2018 CLINICAL DATA:  Acute kidney injury EXAM: RENAL / URINARY TRACT ULTRASOUND COMPLETE COMPARISON:  Abdominal CT 12/31/2018 FINDINGS: Right Kidney: Renal measurements: 10 x 4 x 4 cm = volume: 91 mL. Increased cortical echogenicity with prominent corticomedullary differentiation. No hydronephrosis or mass. Left Kidney: Renal measurements: 9 x 3.9 x 4.4 cm = volume: 93 mL. Increased cortical echogenicity. No hydronephrosis. Bladder: Appears normal for degree of bladder distention. IMPRESSION: Medical renal disease without atrophy or hydronephrosis. Electronically Signed   By: Monte Fantasia M.D.   On: 12/31/2018 11:08   Ct Renal Stone Study  Result Date: 12/31/2018 CLINICAL DATA:  Dysuria. Recent diagnosis of UTI. Fever. Vomiting. EXAM: CT  ABDOMEN AND PELVIS WITHOUT CONTRAST TECHNIQUE: Multidetector CT imaging of the abdomen and pelvis was performed following the standard protocol without IV contrast. COMPARISON:  CT scan Feb 12, 2018. FINDINGS: Lower chest: Bilateral dependent atelectasis. The lower chest is otherwise unremarkable. Hepatobiliary: Previous cholecystectomy. The left hepatic lobe is hypertrophied  but stable. No liver masses are noted. Pancreas: Unremarkable. No pancreatic ductal dilatation or surrounding inflammatory changes. Spleen: Previous splenectomy. Adrenals/Urinary Tract: Adrenal glands are normal. No renal stones, masses, or hydronephrosis. No ureteral stones. The bladder is unremarkable. Stomach/Bowel: The stomach and small bowel are normal. Colonic diverticulosis is seen without diverticulitis. The remainder of the colon is normal. The visualized appendix is unremarkable. Vascular/Lymphatic: The abdominal aorta is unremarkable. 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 No adenopathy. Reproductive: Uterus and bilateral adnexa are unremarkable. Other: No abdominal wall hernia or abnormality. No abdominopelvic ascites. Musculoskeletal: Anterior wedging of T10 and T12 is stable. No changes in the visualized bones. IMPRESSION: 1. No cause for the patient's symptoms identified. 2. Colonic diverticulosis without diverticulitis. 3. 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 Electronically Signed   By: Dorise Bullion III M.D   On: 12/31/2018 00:24    Labs:  CBC: Recent Labs    04/28/18 2200 06/29/18 2328 12/30/18 2223 12/31/18 0437  WBC 7.4 8.0 7.6 6.7  HGB 14.0 12.1 10.2* 9.3*  HCT 42.6 38.2 31.8* 29.3*  PLT 180 205 154 142*    COAGS: Recent Labs    01/01/19 1227  INR 1.1  APTT 34    BMP: Recent Labs    12/31/18 0437 01/01/19 0505 01/02/19 0427 01/03/19 0452  NA 139 139 142 140  K 3.2* 4.0 4.1  3.6  CL 112* 113* 115* 113*  CO2 16* 16* 17* 17*  GLUCOSE 106* 91 86 89  BUN 64* 65* 65* 69*  CALCIUM 7.9* 7.7* 7.6* 7.8*  CREATININE 6.79* 7.13* 7.02* 6.95*  GFRNONAA 6* 6* 6* 6*  GFRAA 7* 7* 7* 7*    LIVER FUNCTION TESTS: Recent Labs    02/17/18 1316 03/14/18 1854 12/30/18 2223 12/31/18 0437 01/01/19 0505 01/02/19 0427 01/03/19 0452  BILITOT 1.0 0.4 0.5 0.5  --   --   --   AST '29 24 18 15  '$ --   --   --   ALT '19 19 12 9  '$ --   --   --   ALKPHOS 68 70 64 58  --   --   --   PROT 7.2 5.8* 6.8 6.0*  --   --   --   ALBUMIN 3.9 3.1* 3.5 3.0* 2.8* 2.7* 2.7*     Assessment and Plan:  AKI. Plan for image-guided random renal biopsy today with Dr. Vernard Gambles. Patient is NPO. Afebrile and WBCs WNL. Heparin held per IR protocol. INR 1.1 seconds 01/01/2019.  Risks and benefits discussed with the patient including, but not limited to bleeding, infection, damage to adjacent structures or low yield requiring additional tests. All of the patient's questions were answered, patient is agreeable to proceed. Consent signed and in chart.   Thank you for this interesting consult.  I greatly enjoyed meeting CARIME DINKEL and look forward to participating in their care.  A copy of this report was sent to the requesting provider on this date.  Electronically Signed: Earley Abide, PA-C 01/03/2019, 9:14 AM   I spent a total of 40 Minutes in face to face in clinical consultation, greater than 50% of which was counseling/coordinating care for AKI/random renal biopsy.

## 2019-01-03 NOTE — Sedation Documentation (Signed)
Rapid response called on patient. Patient complaining of nausea. Patient placed in reverse [place] to help with BP. Patient alert and oriented x4.

## 2019-01-04 ENCOUNTER — Inpatient Hospital Stay (HOSPITAL_COMMUNITY): Payer: Medicare Other

## 2019-01-04 LAB — HCV AB W REFLEX TO QUANT PCR: HCV Ab: <0.1 s/co ratio (ref 0.0–0.9)

## 2019-01-04 LAB — RENAL FUNCTION PANEL
Albumin: 2.7 g/dL — ABNORMAL LOW (ref 3.5–5.0)
Anion gap: 11 (ref 5–15)
BUN: 69 mg/dL — ABNORMAL HIGH (ref 6–20)
CO2: 16 mmol/L — ABNORMAL LOW (ref 22–32)
Calcium: 7.7 mg/dL — ABNORMAL LOW (ref 8.9–10.3)
Chloride: 113 mmol/L — ABNORMAL HIGH (ref 98–111)
Creatinine, Ser: 6.8 mg/dL — ABNORMAL HIGH (ref 0.44–1.00)
GFR calc Af Amer: 7 mL/min — ABNORMAL LOW (ref >60)
GFR calc non Af Amer: 6 mL/min — ABNORMAL LOW (ref >60)
Glucose, Bld: 94 mg/dL (ref 70–99)
Phosphorus: 6.8 mg/dL — ABNORMAL HIGH (ref 2.5–4.6)
Potassium: 3.9 mmol/L (ref 3.5–5.1)
Sodium: 140 mmol/L (ref 135–145)

## 2019-01-04 LAB — HCV INTERPRETATION

## 2019-01-04 LAB — HEMOGLOBIN AND HEMATOCRIT, BLOOD
HCT: 25.4 % — ABNORMAL LOW (ref 36.0–46.0)
Hemoglobin: 8 g/dL — ABNORMAL LOW (ref 12.0–15.0)

## 2019-01-04 MED ORDER — AMLODIPINE BESYLATE 10 MG PO TABS: 10.0000 mg | ORAL_TABLET | Freq: Every day | ORAL | 0 refills | Status: DC

## 2019-01-04 MED ORDER — AMLODIPINE BESYLATE 5 MG PO TABS
10.0000 mg | ORAL_TABLET | Freq: Every day | ORAL | Status: DC
  Administered 2019-01-04: 10 mg via ORAL
  Filled 2019-01-04: qty 2

## 2019-01-04 MED ORDER — METOPROLOL TARTRATE 25 MG PO TABS: 25.0000 mg | ORAL_TABLET | Freq: Two times a day (BID) | ORAL | 0 refills | Status: DC

## 2019-01-04 MED ORDER — ISOSORBIDE DINITRATE 10 MG PO TABS: 10.0000 mg | ORAL_TABLET | Freq: Two times a day (BID) | ORAL | 0 refills | Status: DC

## 2019-01-04 MED ORDER — SODIUM BICARBONATE 650 MG PO TABS: 650.0000 mg | ORAL_TABLET | Freq: Two times a day (BID) | ORAL | 0 refills | Status: AC

## 2019-01-04 NOTE — Progress Notes (Signed)
Admit: 12/30/2018 LOS: 4  Summer Cook with acute on chronic renal failure, pyuria, hematuria, after ciprofloxacin exposure  Subjective:  . Renal Bx 4/7, uneventful . Good UOP, feels well, no c/o, no uremic Sx; no gross hematuria . No sig LEE . BP, esp DBP up, reviewed home BP meds . ANCA neg, HCV and HBV neg, ANA neg . C3 and C4 are normal, anti-GBM negative, HIV negative, urine culture no growth, ASO negative  04/07 0701 - 04/08 0700 In: 397.7 [P.O.:240; I.V.:157.7] Out: 1400 [Urine:1400]  Filed Weights   01/02/19 1438 01/03/19 0529 01/04/19 0459  Weight: 92 kg 84.6 kg 86.6 kg    Scheduled Meds: . amLODipine  10 mg Oral Daily  . isosorbide dinitrate  10 mg Oral BID  . metoprolol tartrate  25 mg Oral BID  . sertraline  100 mg Oral Daily  . sodium bicarbonate  650 mg Oral BID  . venlafaxine  37.5 mg Oral BID WC   Continuous Infusions: . sodium chloride 10 mL/hr at 01/01/19 0312   PRN Meds:.acetaminophen **OR** acetaminophen, fentaNYL (SUBLIMAZE) injection, hydrALAZINE, ipratropium-albuterol, ondansetron **OR** ondansetron (ZOFRAN) IV  Current Labs: reviewed    Physical Exam:  Blood pressure (!) 148/111, pulse 80, temperature 98.6 F (37 C), temperature source Oral, resp. rate 17, height 5\' 5"  (1.651 m), weight 86.6 kg, last menstrual period 08/13/2011, SpO2 98 %. NAD, well-appearing, conversant RRR, normal S1 and S2 Clear bilaterally, normal work of breathing Obese, soft, non-distended, nontender No lower extremity edema No rashes or lesions Nonfocal, CN II through XII intact EOMI, NCAT  A 1. Nonoliguric acute on chronic renal insufficiency, baseline creatinine 1.5, after exposure to ciprofloxacin with hematuria and pyuria; serological and infectious work-up negative; 12/31/2018 renal ultrasound without obstruction or structural etiology; s/p biopsy 01/03/2019, results pending 2. HTN, BPs elevated esp DBP 3. CAD 4. 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 Mild anemia, normocytic, Tsat 20% 6. Recent exposure to ciprofloxacin to treat possible UTI, urine culture at admission negative 7. NAG metabolic acidosis on NaHCO3  P . Add amlodipine to BB . Check Hb post biopsy, ok for DC if stable . Discussed need path report for therapeutic options . Given stable / slight improvement in SCr ok with DC and I will arrange f/u with me next week, f/u labs as well   Pearson Grippe MD 01/04/2019, 9:12 AM  Recent Labs  Lab 01/02/19 0427 01/03/19 0452 01/04/19 0607  NA 142 140 140  K 4.1 3.6 3.9  CL 115* 113* 113*  CO2 17* 17* 16*  GLUCOSE 86 89 94  BUN 65* 69* 69*  CREATININE 7.02* 6.95* 6.80*  CALCIUM 7.6* 7.8* 7.7*  PHOS 6.9* 6.7* 6.8*   Recent Labs  Lab 12/30/18 2223 12/31/18 0437  WBC 7.6 6.7  NEUTROABS 4.3  --   HGB 10.2* 9.3*  HCT 31.8* 29.3*  MCV 88.1 88.0  PLT 154 142*

## 2019-01-04 NOTE — Progress Notes (Signed)
IV's removed and discharge instructions reviewed.  Scripts sent to pharmacy.  To follow up with nephro and they will call her.  Daughter to drive patient home

## 2019-01-04 NOTE — Discharge Summary (Addendum)
Physician Discharge Summary  ABERDEEN HAFEN LHT:[id number] DOB: 05/19/1966 DOA: 12/30/2018  PCP: Redmond School, MD  Admit date: 12/30/2018  Discharge date: 01/04/2019  Admitted From:Home  Disposition:  Home  Recommendations for Outpatient Follow-up:  1. Follow up with PCP in 1-2 weeks 2. Follow-up with Dr. Joelyn Oms of nephrology to review biopsy results and reevaluate blood pressure in 1 week  Home Health: None  Equipment/Devices: None  Discharge Condition: Stable  CODE STATUS: Full  Diet recommendation: Heart Healthy  Brief/Interim Summary: Per HPI:53 y.o.female,with a history of hypertension, CAD, GERD, ITP status post splenectomy, history of PE in 2013, came to hospital with 5-day history of nausea and vomiting. Patient says that she developed dysuria along with nausea vomiting last Friday and was seen by her PCP on Monday. She was prescribed ciprofloxacin for UTI. She has been taking it every day. Patient also was prescribed diltiazem for hypertension, earlier she was taking amlodipine which did not improve her blood pressure. Patient also was given Zofran by her PCP for nausea vomiting which did not help. She has experienced worsening fatigue, myalgias. Complains of chest pain denies abdominal pain. Also complains of headache, in the ED patient was found to be in hypertensive urgency. Denies previous history of seizures. She had stroke in 2017. Also has a history of CAD, medically managed with aspirin, Imdur.  In the ED, lab work revealed creatinine 6.88,patient's baseline creatinine 1.57. Also found to have elevated troponin 0.15.  Patient was admitted with AKI on CKD stage III with concerns for acute interstitial nephritis versus a GN and had undergone a renal biopsy on 4/7.  Renal ultrasound did not demonstrate any obstructive uropathy or hydronephrosis and patient was started on sodium bicarbonate.  She is otherwise doing well at this point in time with no urgent  need for hemodialysis noted.  Renal function remained stable as noted on labs below.  She has been seen by nephrology and is noted to be appropriate for discharge with blood pressure medication changes as noted below.  She will follow-up in the office in 1 week to review biopsy results and reevaluate blood pressure at that time.  No other acute events noted during the course of this brief admission.  Of note, hemoglobin noted to be at 8 on day of discharge and renal ultrasound was performed to ensure that there was no fluid collection noted around the kidneys and there was none.  She will follow-up closely with nephrology with repeat labs within the week.  Discharge Diagnoses:  Active Problems:   AKI (acute kidney injury) ([place])  Principal discharge diagnosis: AKI on CKD stage III.  Discharge Instructions  Discharge Instructions    Diet - low sodium heart healthy   Complete by:  As directed    Increase activity slowly   Complete by:  As directed      Allergies as of 01/04/2019      Reactions   Brintellix [vortioxetine] Itching   Penicillins Itching   Cymbalta [duloxetine Hcl] Itching   Yellow Jacket Venom Swelling   Adhesive [tape] Rash   Please use paper tape   Zoloft [sertraline Hcl] Diarrhea      Medication List    STOP taking these medications   azithromycin 250 MG tablet Commonly known as:  ZITHROMAX   diltiazem 120 MG tablet Commonly known as:  CARDIZEM     TAKE these medications   ALPRAZolam 0.5 MG tablet Commonly known as:  XANAX Take 1 tablet by mouth 3 (three) times  daily.   amLODipine 10 MG tablet Commonly known as:  NORVASC Take 1 tablet (10 mg total) by mouth daily for 30 days. Start taking on:  January 05, 2019   aspirin 81 MG EC tablet Take 1 tablet (81 mg total) by mouth daily.   ciprofloxacin 500 MG tablet Commonly known as:  CIPRO Take 500 mg by mouth daily with breakfast.   diclofenac sodium 1 % Gel Commonly known as:  VOLTAREN Apply 2 g  topically 4 (four) times daily.   HYDROcodone-acetaminophen 10-325 MG tablet Commonly known as:  NORCO Take 1 tablet by mouth every 4 (four) hours as needed.   ipratropium-albuterol 0.5-2.5 (3) MG/3ML Soln Commonly known as:  DUONEB Take 3 mLs by nebulization 3 (three) times daily as needed (shorntess of breath/wheezing). What changed:  reasons to take this   isosorbide dinitrate 10 MG tablet Commonly known as:  ISORDIL Take 1 tablet (10 mg total) by mouth 2 (two) times daily for 30 days. What changed:  when to take this   metoprolol tartrate 25 MG tablet Commonly known as:  LOPRESSOR Take 1 tablet (25 mg total) by mouth 2 (two) times daily for 30 days.   nitroGLYCERIN 0.4 MG SL tablet Commonly known as:  NITROSTAT Place 1 tablet (0.4 mg total) under the tongue every 5 (five) minutes x 3 doses as needed for chest pain.   Oxycodone HCl 10 MG Tabs Take 10 mg by mouth every 4 (four) hours as needed (for pain).   ProAir RespiClick 967 (90 Base) MCG/ACT Aepb Generic drug:  Albuterol Sulfate Inhale 2 puffs into the lungs every 4 (four) hours as needed (Shortness of breath).   sertraline 100 MG tablet Commonly known as:  ZOLOFT Take 1 tablet by mouth at bedtime.   sodium bicarbonate 650 MG tablet Take 1 tablet (650 mg total) by mouth 2 (two) times daily for 30 days.      Follow-up Information    Rexene Agent, MD Follow up in 1 week(s).   Specialty:  Nephrology Why:  We will reach out to schedule an appointment and follow up labs. Contact information: [place] [address] [phone]        Redmond School, MD Follow up in 2 week(s).   Specialty:  Internal Medicine Contact information: [address] [place] [address] [phone]          Allergies  Allergen Reactions  . Brintellix [Vortioxetine] Itching  . Penicillins Itching  . Cymbalta [Duloxetine Hcl] Itching  . Yellow Jacket Venom Swelling  . Adhesive [Tape] Rash     Please use paper tape  . Zoloft [Sertraline Hcl] Diarrhea    Consultations:  Nephrology   Procedures/Studies: US Renal  Result Date: 01/04/2019 CLINICAL DATA:  53 year old female with anemia and a prior left kidney biopsy EXAM: RENAL / URINARY TRACT ULTRASOUND COMPLETE COMPARISON:  12/31/2018, 01/03/2019 FINDINGS: Right Kidney: Length: 9.8 cm x 4.1 cm x 4.3 cm, 91 cc. No hydronephrosis. Flow confirmed in the hilum of the right kidney. Left Kidney: Length: 8.8 cm x 3.8 cm x 5.1 cm, 91 cc. No hydronephrosis. Flow confirmed in the hilum of the right kidney. There is no fluid collection on the lateral aspect of the left kidney. Lower pole not well visualized. Bladder: Urinary bladder decompressed IMPRESSION: No evidence of hydronephrosis of the left or right kidney. No fluid collection associated with the left kidney. Electronically Signed   By: Corrie Mckusick D.O.   On: 01/04/2019 13:37  US Renal  Result Date: 12/31/2018 CLINICAL DATA:  Acute kidney injury EXAM: RENAL / URINARY TRACT ULTRASOUND COMPLETE COMPARISON:  Abdominal CT 12/31/2018 FINDINGS: Right Kidney: Renal measurements: 10 x 4 x 4 cm = volume: 91 mL. Increased cortical echogenicity with prominent corticomedullary differentiation. No hydronephrosis or mass. Left Kidney: Renal measurements: 9 x 3.9 x 4.4 cm = volume: 93 mL. Increased cortical echogenicity. No hydronephrosis. Bladder: Appears normal for degree of bladder distention. IMPRESSION: Medical renal disease without atrophy or hydronephrosis. Electronically Signed   By: Monte Fantasia M.D.   On: 12/31/2018 11:08   Ct Renal Stone Study  Result Date: 12/31/2018 CLINICAL DATA:  Dysuria. Recent diagnosis of UTI. Fever. Vomiting. EXAM: CT ABDOMEN AND PELVIS WITHOUT CONTRAST TECHNIQUE: Multidetector CT imaging of the abdomen and pelvis was performed following the standard protocol without IV contrast. COMPARISON:  CT scan Feb 12, 2018. FINDINGS: Lower chest: Bilateral dependent  atelectasis. The lower chest is otherwise unremarkable. Hepatobiliary: Previous cholecystectomy. The left hepatic lobe is hypertrophied but stable. No liver masses are noted. Pancreas: Unremarkable. No pancreatic ductal dilatation or surrounding inflammatory changes. Spleen: Previous splenectomy. Adrenals/Urinary Tract: Adrenal glands are normal. No renal stones, masses, or hydronephrosis. No ureteral stones. The bladder is unremarkable. Stomach/Bowel: The stomach and small bowel are normal. Colonic diverticulosis is seen without diverticulitis. The remainder of the colon is normal. The visualized appendix is unremarkable. Vascular/Lymphatic: The abdominal aorta is unremarkable. 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 No adenopathy. Reproductive: Uterus and bilateral adnexa are unremarkable. Other: No abdominal wall hernia or abnormality. No abdominopelvic ascites. Musculoskeletal: Anterior wedging of T10 and T12 is stable. No changes in the visualized bones. IMPRESSION: 1. No cause for the patient's symptoms identified. 2. Colonic diverticulosis without diverticulitis. 3. 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 Electronically Signed   By: Dorise Bullion III M.D   On: 12/31/2018 00:24   US Biopsy (kidney)  Result Date: 01/03/2019 CLINICAL DATA:  Acute renal failure of unknown etiology EXAM: ULTRASOUND GUIDED RENAL CORE BIOPSY COMPARISON:  None TECHNIQUE: The procedure, risks (including but not limited to bleeding, infection, organ damage ), benefits, and alternatives were explained to the patient. Questions regarding the procedure were encouraged and answered. The patient understands and consents to the procedure. Survey ultrasound was performed and an appropriate skin entry site was localized. Site was marked, prepped with Betadine, draped in usual sterile fashion, infiltrated locally with 1%  lidocaine. Intravenous Fentanyl 28mcg and Versed 1mg  were administered as conscious sedation during continuous monitoring of the patient's level of consciousness and physiological / cardiorespiratory status by the radiology RN, with a total moderate sedation time of 12 minutes. Under real time ultrasound guidance, a 15 gauge trocar needle was advanced to the margin of the lower pole of the left kidney for 3 coaxial 16 gauge core biopsy needle passes. The core samples were submitted to pathology. The patient tolerated procedure well. COMPLICATIONS: None immediate IMPRESSION: 1. Technically successful ultrasound-guided core renal biopsy , left lower pole. Electronically Signed   By: Lucrezia Europe M.D.   On: 01/03/2019 13:34    Discharge Exam: Vitals:   01/03/19 2214 01/04/19 0459  BP: (!) 143/103 (!) 148/111  Pulse: 81 80  Resp: 20 17  Temp: 99.1 F (37.3 C) 98.6 F (37 C)  SpO2: 98% 98%   Vitals:   01/03/19 1312 01/03/19 2023 01/03/19 2214 01/04/19 0459  BP: 116/75  Marland Kitchen)  143/103 (!) 148/111  Pulse: 71  81 80  Resp: 18  20 17   Temp: 98.4 F (36.9 C)  99.1 F (37.3 C) 98.6 F (37 C)  TempSrc: Oral   Oral  SpO2: 99% 97% 98% 98%  Weight:    86.6 kg  Height:        General: Pt is alert, awake, not in acute distress Cardiovascular: RRR, S1/S2 +, no rubs, no gallops Respiratory: CTA bilaterally, no wheezing, no rhonchi Abdominal: Soft, NT, ND, bowel sounds + Extremities: no edema, no cyanosis    The results of significant diagnostics from this hospitalization (including imaging, microbiology, ancillary and laboratory) are listed below for reference.     Microbiology: Recent Results (from the past 240 hour(s))  Culture, Urine     Status: None   Collection Time: 12/31/18  2:56 AM  Result Value Ref Range Status   Specimen Description   Final    URINE, CLEAN CATCH Performed at [facility], [address] [place], [place] [address]    Special Requests   Final    NONE Performed at  [id number], [address], [place], [place] [address]    Culture   Final    NO GROWTH Performed at [facility], [place] [address], [place], [place] [address]    Report Status 01/01/2019 FINAL  Final     Labs: BNP (last 3 results) Recent Labs    04/28/18 2200  BNP 01.7   Basic Metabolic Panel: Recent Labs  Lab 12/31/18 0437 01/01/19 0505 01/02/19 0427 01/03/19 0452 01/04/19 0607  NA 139 139 142 140 140  K 3.2* 4.0 4.1 3.6 3.9  CL 112* 113* 115* 113* 113*  CO2 16* 16* 17* 17* 16*  GLUCOSE 106* 91 86 89 94  BUN 64* 65* 65* 69* 69*  CREATININE 6.79* 7.13* 7.02* 6.95* 6.80*  CALCIUM 7.9* 7.7* 7.6* 7.8* 7.7*  PHOS  --  6.7* 6.9* 6.7* 6.8*   Liver Function Tests: Recent Labs  Lab 12/30/18 2223 12/31/18 0437 01/01/19 0505 01/02/19 0427 01/03/19 0452 01/04/19 0607  AST 18 15  --   --   --   --   ALT 12 9  --   --   --   --   ALKPHOS 64 58  --   --   --   --   BILITOT 0.5 0.5  --   --   --   --   PROT 6.8 6.0*  --   --   --   --   ALBUMIN 3.5 3.0* 2.8* 2.7* 2.7* 2.7*   Recent Labs  Lab 12/30/18 2223  LIPASE 58*   No results for input(s): AMMONIA in the last 168 hours. CBC: Recent Labs  Lab 12/30/18 2223 12/31/18 0437 01/04/19 0607  WBC 7.6 6.7  --   NEUTROABS 4.3  --   --   HGB 10.2* 9.3* 8.0*  HCT 31.8* 29.3* 25.4*  MCV 88.1 88.0  --   PLT 154 142*  --    Cardiac Enzymes: Recent Labs  Lab 12/30/18 2223 12/31/18 0437 12/31/18 1117 12/31/18 1443  TROPONINI 0.15* 0.12* 0.11* 0.10*   BNP: Invalid input(s): POCBNP CBG: Recent Labs  Lab 01/03/19 1201  GLUCAP 71   D-Dimer No results for input(s): DDIMER in the last 72 hours. Hgb A1c No results for input(s): HGBA1C in the last 72 hours. Lipid Profile No results for input(s): CHOL, HDL, LDLCALC, TRIG, CHOLHDL, LDLDIRECT in the last 72 hours. Thyroid  function studies No results for input(s): TSH, T4TOTAL, T3FREE, THYROIDAB in the last 72 hours.  Invalid input(s): FREET3 Anemia work  up No results for input(s): VITAMINB12, FOLATE, FERRITIN, TIBC, IRON, RETICCTPCT in the last 72 hours. Urinalysis    Component Value Date/Time   COLORURINE STRAW (A) 12/30/2018 2230   APPEARANCEUR CLEAR 12/30/2018 2230   LABSPEC 1.010 12/30/2018 2230   PHURINE 6.0 12/30/2018 2230   GLUCOSEU NEGATIVE 12/30/2018 2230   HGBUR MODERATE (A) 12/30/2018 2230   BILIRUBINUR NEGATIVE 12/30/2018 2230   KETONESUR 5 (A) 12/30/2018 2230   PROTEINUR 100 (A) 12/30/2018 2230   UROBILINOGEN 0.2 02/17/2015 1325   NITRITE NEGATIVE 12/30/2018 2230   LEUKOCYTESUR TRACE (A) 12/30/2018 2230   Sepsis Labs Invalid input(s): PROCALCITONIN,  WBC,  LACTICIDVEN Microbiology Recent Results (from the past 240 hour(s))  Culture, Urine     Status: None   Collection Time: 12/31/18  2:56 AM  Result Value Ref Range Status   Specimen Description   Final    URINE, CLEAN CATCH Performed at [facility], [address], [place], [place] [address]    Special Requests   Final    NONE Performed at [facility], [address] [place], [place] [address]    Culture   Final    NO GROWTH Performed at [facility], [place] [address] [place], [place] [address]    Report Status 01/01/2019 FINAL  Final     Time coordinating discharge: 35 minutes  SIGNED:   Rodena Goldmann, DO [facility] 01/04/2019, 1:47 PM  If 7PM-7AM, please contact night-coverage [email or web address] Password [email or web address]

## 2019-01-04 NOTE — Care Management Important Message (Signed)
Important Message  Patient Details  Name: Summer Cook MRN: [id number] Date of Birth: 1966/03/03   Medicare Important Message Given:  Yes    Tommy Medal 01/04/2019, 11:06 AM

## 2019-01-06 ENCOUNTER — Other Ambulatory Visit: Payer: Self-pay

## 2019-01-06 ENCOUNTER — Emergency Department (HOSPITAL_COMMUNITY): Payer: Medicare Other

## 2019-01-06 ENCOUNTER — Encounter (HOSPITAL_COMMUNITY): Payer: Self-pay

## 2019-01-06 ENCOUNTER — Inpatient Hospital Stay (HOSPITAL_COMMUNITY)
Admission: EM | Admit: 2019-01-06 | Discharge: 2019-01-20 | DRG: 673 | Disposition: A | Discharge status: Home / Self Care | Payer: Medicare Other | Attending: Internal Medicine | Admitting: Internal Medicine

## 2019-01-06 DIAGNOSIS — E8770 Fluid overload, unspecified: Secondary | ICD-10-CM | POA: Diagnosis not present

## 2019-01-06 DIAGNOSIS — I25119 Atherosclerotic heart disease of native coronary artery with unspecified angina pectoris: Secondary | ICD-10-CM | POA: Diagnosis not present

## 2019-01-06 DIAGNOSIS — Z86711 Personal history of pulmonary embolism: Secondary | ICD-10-CM | POA: Diagnosis not present

## 2019-01-06 DIAGNOSIS — Z880 Allergy status to penicillin: Secondary | ICD-10-CM

## 2019-01-06 DIAGNOSIS — R509 Fever, unspecified: Secondary | ICD-10-CM | POA: Diagnosis present

## 2019-01-06 DIAGNOSIS — R34 Anuria and oliguria: Secondary | ICD-10-CM | POA: Diagnosis not present

## 2019-01-06 DIAGNOSIS — I132 Hypertensive heart and chronic kidney disease with heart failure and with stage 5 chronic kidney disease, or end stage renal disease: Secondary | ICD-10-CM | POA: Diagnosis present

## 2019-01-06 DIAGNOSIS — N10 Acute tubulo-interstitial nephritis: Secondary | ICD-10-CM | POA: Diagnosis not present

## 2019-01-06 DIAGNOSIS — D62 Acute posthemorrhagic anemia: Secondary | ICD-10-CM | POA: Diagnosis not present

## 2019-01-06 DIAGNOSIS — R109 Unspecified abdominal pain: Secondary | ICD-10-CM | POA: Diagnosis not present

## 2019-01-06 DIAGNOSIS — E119 Type 2 diabetes mellitus without complications: Secondary | ICD-10-CM

## 2019-01-06 DIAGNOSIS — I10 Essential (primary) hypertension: Secondary | ICD-10-CM | POA: Diagnosis not present

## 2019-01-06 DIAGNOSIS — Z560 Unemployment, unspecified: Secondary | ICD-10-CM

## 2019-01-06 DIAGNOSIS — N99840 Postprocedural hematoma of a genitourinary system organ or structure following a genitourinary system procedure: Secondary | ICD-10-CM | POA: Diagnosis present

## 2019-01-06 DIAGNOSIS — N183 Chronic kidney disease, stage 3 unspecified: Secondary | ICD-10-CM

## 2019-01-06 DIAGNOSIS — Z8673 Personal history of transient ischemic attack (TIA), and cerebral infarction without residual deficits: Secondary | ICD-10-CM

## 2019-01-06 DIAGNOSIS — N151 Renal and perinephric abscess: Secondary | ICD-10-CM

## 2019-01-06 DIAGNOSIS — D693 Immune thrombocytopenic purpura: Secondary | ICD-10-CM | POA: Diagnosis present

## 2019-01-06 DIAGNOSIS — D508 Other iron deficiency anemias: Secondary | ICD-10-CM | POA: Diagnosis not present

## 2019-01-06 DIAGNOSIS — J90 Pleural effusion, not elsewhere classified: Secondary | ICD-10-CM | POA: Diagnosis not present

## 2019-01-06 DIAGNOSIS — I361 Nonrheumatic tricuspid (valve) insufficiency: Secondary | ICD-10-CM | POA: Diagnosis not present

## 2019-01-06 DIAGNOSIS — J9601 Acute respiratory failure with hypoxia: Secondary | ICD-10-CM

## 2019-01-06 DIAGNOSIS — N170 Acute kidney failure with tubular necrosis: Principal | ICD-10-CM | POA: Diagnosis present

## 2019-01-06 DIAGNOSIS — D509 Iron deficiency anemia, unspecified: Secondary | ICD-10-CM

## 2019-01-06 DIAGNOSIS — E872 Acidosis: Secondary | ICD-10-CM | POA: Diagnosis present

## 2019-01-06 DIAGNOSIS — I2510 Atherosclerotic heart disease of native coronary artery without angina pectoris: Secondary | ICD-10-CM | POA: Diagnosis not present

## 2019-01-06 DIAGNOSIS — E669 Obesity, unspecified: Secondary | ICD-10-CM | POA: Diagnosis present

## 2019-01-06 DIAGNOSIS — R0600 Dyspnea, unspecified: Secondary | ICD-10-CM | POA: Diagnosis not present

## 2019-01-06 DIAGNOSIS — R319 Hematuria, unspecified: Secondary | ICD-10-CM | POA: Diagnosis present

## 2019-01-06 DIAGNOSIS — J811 Chronic pulmonary edema: Secondary | ICD-10-CM | POA: Diagnosis not present

## 2019-01-06 DIAGNOSIS — Z91030 Bee allergy status: Secondary | ICD-10-CM

## 2019-01-06 DIAGNOSIS — N185 Chronic kidney disease, stage 5: Secondary | ICD-10-CM

## 2019-01-06 DIAGNOSIS — K5730 Diverticulosis of large intestine without perforation or abscess without bleeding: Secondary | ICD-10-CM | POA: Diagnosis not present

## 2019-01-06 DIAGNOSIS — Z20828 Contact with and (suspected) exposure to other viral communicable diseases: Secondary | ICD-10-CM | POA: Diagnosis present

## 2019-01-06 DIAGNOSIS — K219 Gastro-esophageal reflux disease without esophagitis: Secondary | ICD-10-CM | POA: Diagnosis not present

## 2019-01-06 DIAGNOSIS — N179 Acute kidney failure, unspecified: Secondary | ICD-10-CM | POA: Diagnosis not present

## 2019-01-06 DIAGNOSIS — Z888 Allergy status to other drugs, medicaments and biological substances status: Secondary | ICD-10-CM

## 2019-01-06 DIAGNOSIS — I5041 Acute combined systolic (congestive) and diastolic (congestive) heart failure: Secondary | ICD-10-CM | POA: Diagnosis not present

## 2019-01-06 DIAGNOSIS — D631 Anemia in chronic kidney disease: Secondary | ICD-10-CM | POA: Diagnosis present

## 2019-01-06 DIAGNOSIS — N186 End stage renal disease: Secondary | ICD-10-CM | POA: Diagnosis not present

## 2019-01-06 DIAGNOSIS — Z9049 Acquired absence of other specified parts of digestive tract: Secondary | ICD-10-CM

## 2019-01-06 DIAGNOSIS — R0602 Shortness of breath: Secondary | ICD-10-CM | POA: Diagnosis not present

## 2019-01-06 DIAGNOSIS — Z9081 Acquired absence of spleen: Secondary | ICD-10-CM

## 2019-01-06 DIAGNOSIS — Z91048 Other nonmedicinal substance allergy status: Secondary | ICD-10-CM

## 2019-01-06 DIAGNOSIS — Z20822 Contact with and (suspected) exposure to covid-19: Secondary | ICD-10-CM

## 2019-01-06 DIAGNOSIS — M109 Gout, unspecified: Secondary | ICD-10-CM | POA: Diagnosis present

## 2019-01-06 DIAGNOSIS — F419 Anxiety disorder, unspecified: Secondary | ICD-10-CM | POA: Diagnosis present

## 2019-01-06 DIAGNOSIS — Z992 Dependence on renal dialysis: Secondary | ICD-10-CM | POA: Diagnosis not present

## 2019-01-06 DIAGNOSIS — I5023 Acute on chronic systolic (congestive) heart failure: Secondary | ICD-10-CM | POA: Diagnosis not present

## 2019-01-06 DIAGNOSIS — R05 Cough: Secondary | ICD-10-CM | POA: Diagnosis not present

## 2019-01-06 DIAGNOSIS — I252 Old myocardial infarction: Secondary | ICD-10-CM | POA: Diagnosis not present

## 2019-01-06 DIAGNOSIS — E871 Hypo-osmolality and hyponatremia: Secondary | ICD-10-CM | POA: Diagnosis present

## 2019-01-06 DIAGNOSIS — E782 Mixed hyperlipidemia: Secondary | ICD-10-CM | POA: Diagnosis present

## 2019-01-06 DIAGNOSIS — Z87891 Personal history of nicotine dependence: Secondary | ICD-10-CM

## 2019-01-06 DIAGNOSIS — I129 Hypertensive chronic kidney disease with stage 1 through stage 4 chronic kidney disease, or unspecified chronic kidney disease: Secondary | ICD-10-CM | POA: Diagnosis not present

## 2019-01-06 DIAGNOSIS — Y848 Other medical procedures as the cause of abnormal reaction of the patient, or of later complication, without mention of misadventure at the time of the procedure: Secondary | ICD-10-CM | POA: Diagnosis present

## 2019-01-06 DIAGNOSIS — R059 Cough, unspecified: Secondary | ICD-10-CM

## 2019-01-06 DIAGNOSIS — N189 Chronic kidney disease, unspecified: Secondary | ICD-10-CM | POA: Diagnosis not present

## 2019-01-06 DIAGNOSIS — R6889 Other general symptoms and signs: Secondary | ICD-10-CM | POA: Diagnosis not present

## 2019-01-06 DIAGNOSIS — T148XXA Other injury of unspecified body region, initial encounter: Secondary | ICD-10-CM

## 2019-01-06 DIAGNOSIS — S37022A Major contusion of left kidney, initial encounter: Secondary | ICD-10-CM | POA: Diagnosis not present

## 2019-01-06 DIAGNOSIS — S37012A Minor contusion of left kidney, initial encounter: Secondary | ICD-10-CM | POA: Diagnosis not present

## 2019-01-06 DIAGNOSIS — Z6831 Body mass index (BMI) 31.0-31.9, adult: Secondary | ICD-10-CM

## 2019-01-06 DIAGNOSIS — Z7982 Long term (current) use of aspirin: Secondary | ICD-10-CM

## 2019-01-06 DIAGNOSIS — Z79899 Other long term (current) drug therapy: Secondary | ICD-10-CM

## 2019-01-06 DIAGNOSIS — F329 Major depressive disorder, single episode, unspecified: Secondary | ICD-10-CM | POA: Diagnosis present

## 2019-01-06 DIAGNOSIS — E785 Hyperlipidemia, unspecified: Secondary | ICD-10-CM | POA: Diagnosis present

## 2019-01-06 LAB — COMPREHENSIVE METABOLIC PANEL
ALT: 12 U/L (ref 0–44)
AST: 17 U/L (ref 15–41)
Albumin: 3.2 g/dL — ABNORMAL LOW (ref 3.5–5.0)
Alkaline Phosphatase: 57 U/L (ref 38–126)
Anion gap: 12 (ref 5–15)
BUN: 74 mg/dL — ABNORMAL HIGH (ref 6–20)
CO2: 17 mmol/L — ABNORMAL LOW (ref 22–32)
Calcium: 7.7 mg/dL — ABNORMAL LOW (ref 8.9–10.3)
Chloride: 105 mmol/L (ref 98–111)
Creatinine, Ser: 6.93 mg/dL — ABNORMAL HIGH (ref 0.44–1.00)
GFR calc Af Amer: 7 mL/min — ABNORMAL LOW (ref >60)
GFR calc non Af Amer: 6 mL/min — ABNORMAL LOW (ref >60)
Glucose, Bld: 126 mg/dL — ABNORMAL HIGH (ref 70–99)
Potassium: 4.1 mmol/L (ref 3.5–5.1)
Sodium: 134 mmol/L — ABNORMAL LOW (ref 135–145)
Total Bilirubin: 0.7 mg/dL (ref 0.3–1.2)
Total Protein: 6.3 g/dL — ABNORMAL LOW (ref 6.5–8.1)

## 2019-01-06 LAB — CBC WITH DIFFERENTIAL/PLATELET
Abs Immature Granulocytes: 0.01 10*3/uL (ref 0.00–0.07)
Basophils Absolute: 0.1 10*3/uL (ref 0.0–0.1)
Basophils Relative: 1 %
Eosinophils Absolute: 0.2 10*3/uL (ref 0.0–0.5)
Eosinophils Relative: 3 %
HCT: 25.6 % — ABNORMAL LOW (ref 36.0–46.0)
Hemoglobin: 8 g/dL — ABNORMAL LOW (ref 12.0–15.0)
Immature Granulocytes: 0 %
Lymphocytes Relative: 29 %
Lymphs Abs: 2.3 10*3/uL (ref 0.7–4.0)
MCH: 28.3 pg (ref 26.0–34.0)
MCHC: 31.3 g/dL (ref 30.0–36.0)
MCV: 90.5 fL (ref 80.0–100.0)
Monocytes Absolute: 0.6 10*3/uL (ref 0.1–1.0)
Monocytes Relative: 8 %
Neutro Abs: 4.6 10*3/uL (ref 1.7–7.7)
Neutrophils Relative %: 59 %
Platelets: 188 10*3/uL (ref 150–400)
RBC: 2.83 MIL/uL — ABNORMAL LOW (ref 3.87–5.11)
RDW: 16.3 % — ABNORMAL HIGH (ref 11.5–15.5)
WBC: 7.8 10*3/uL (ref 4.0–10.5)
nRBC: 0 % (ref 0.0–0.2)

## 2019-01-06 LAB — LACTIC ACID, PLASMA: Lactic Acid, Venous: 0.8 mmol/L (ref 0.5–1.9)

## 2019-01-06 MED ORDER — SODIUM CHLORIDE 0.9 % IV SOLN
INTRAVENOUS | Status: DC
  Administered 2019-01-06 – 2019-01-07 (×2): via INTRAVENOUS

## 2019-01-06 NOTE — ED Provider Notes (Signed)
Patient signed out pending imaging.  In brief this is a 53 year old female with a recent hospital admission and kidney biopsy.  Reports fever at home to 103 and flank pain.  She is also had upper respiratory symptoms.  She is afebrile here and initial lab work is reassuring and at baseline.  No significant lactic acidosis or leukocytosis.   Physical Exam  BP 123/82 (BP Location: Right Arm)   Pulse 74   Temp 97.9 F (36.6 C) (Oral)   Resp (!) 26   Ht 1.651 m (5\' 5" )   Wt 86.6 kg   LMP 08/13/2011   SpO2 97%   BMI 31.77 kg/m    12:29 AM CT scans reviewed.  CT scan of the chest concerning for pulmonary edema but also has groundglass opacities which could indicate infection vs COVID.  Given fever and respiratory symptoms, will presume COVID infection.  Distally, there is a hematoma at the biopsy site of the left kidney and also some questionable area of infection.  Nephrology consulted.  Will discuss antibiotics with them.  Patient will need admission.   1:00 AM Patient discussed with nephrology, Dr. Justin Mend.  We will plan to be evaluated by nephrology.  Recommend cephalosporin to cover for possible post biopsy infection.  Discussed with Dr. Maudie Mercury, hospitalist.  Summer Cook was evaluated in Emergency Department on 01/07/2019 for the symptoms described in the history of present illness. She was evaluated in the context of the global COVID-19 pandemic, which necessitated consideration that the patient might be at risk for infection with the SARS-CoV-2 virus that causes COVID-19. Institutional protocols and algorithms that pertain to the evaluation of patients at risk for COVID-19 are in a state of rapid change based on information released by regulatory bodies including the CDC and federal and state organizations. These policies and algorithms were followed during the patient's care in the ED.     Merryl Hacker, MD 01/07/19 [phone]

## 2019-01-06 NOTE — ED Triage Notes (Signed)
Patient started having left flank pain that started mainly after her kidney biopsy Tuesday. It worsened upon walking. Called [facility] and they said to come in to see if she has a kidney abscess.  Took tyl at 7

## 2019-01-06 NOTE — ED Provider Notes (Signed)
[facility] EMERGENCY DEPARTMENT Provider Note   CSN: [id number] Arrival date & time: 01/06/19  2107    History   Chief Complaint Chief Complaint  Patient presents with  . Flank Pain    left     HPI Summer Cook is a 53 y.o. female.     Patient directed to come in by her nephrologist.  For concerns for an abscess to her left kidney where she had a biopsy on April 7.  Patient was admitted to the hospital from April 3 to April 8.  On April 7 she had the biopsy.  She was admitted for acute kidney injury on top of her chronic kidney disease which is stage IV.  They did a renal biopsy to rule out nephritis versus glomerulonephritis.  The biopsy was on the left side.  Patient started with some left flank pain the day after she was discharged and started to have a cough which she thought was bronchitis.  And here today she had a temp to 103.  Associated with some body aches.  States it feels as if her abdomen is distended.  Denies sore throat.  Denies any unusual rash.  Patient did take Tylenol at home.  Currently afebrile here.  A little tachypneic with a respiratory rate of 26.  Blood pressure was fine at 010 systolic.  Patient symptoms with the fever sort of fatigue and some body aches and a bit of a cough all concerning may be for [facility] it infection.  In addition clearly and nephrology was concerned about this abscess.     Past Medical History:  Diagnosis Date  . Allergy   . Anxiety   . Asthma   . CAD (coronary artery disease), native coronary artery    a. cath 05/20/17 - 100% distal PDA --> medical mangement (no intervention done)  . Candida esophagitis ([place])   . Chest pain at rest 04/2017  . Depression   . Essential hypertension, benign   . GERD (gastroesophageal reflux disease)   . Gout   . Hemorrhoids   . 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  Last was Prednisone  '40mg'$ ; 2 wk in 10/2012.  She also was on Prednisone bridged with Cellcept briefly but stopped due to lack of insurance.   . Myocardial infarction ([place])   . Pulmonary embolism ([place]) 04/2012  . Renal insufficiency   . Serrated adenoma of colon   . Steroid-induced diabetes ([place])   . Stroke ([place])   . Thrush, oral 05/29/2011    Patient Active Problem List   Diagnosis Date Noted  . AKI (acute kidney injury) ([place]) 12/31/2018  . Chest pain 04/29/2018  . Unstable angina ([place]) 05/29/2017  . CAD (coronary artery disease): total distal RCA EF normal (05/20/17) 05/25/2017  . UTI (urinary tract infection) 05/25/2017  . Chest pain with moderate risk for cardiac etiology 05/23/2017  . Mixed hyperlipidemia   . NSTEMI (non-ST elevated myocardial infarction) ([place]) 05/20/2017  . Diabetes mellitus type 2, uncomplicated ([place]) 27/25/3664  . History of stroke   . Chronic back pain 12/23/2015  . Hypotension 02/16/2015  . Asthma, chronic 02/16/2015  . Anxiety state 02/16/2015  . CKD (chronic kidney disease) stage 3, GFR 30-59 ml/min ([facility]) 04/28/2014  . Nausea with vomiting 01/25/2013  . Abnormal LFTs 01/25/2013  . Orthostatic hypotension 01/25/2013  . Rectal bleeding 12/26/2012  . History of pulmonary embolism 05/03/2012  . Hemorrhoids 06/04/2011  .  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 Immunosuppressed status ([place]) 06/04/2011  . Depression 06/04/2011  . Obesity 06/04/2011  . Idiopathic thrombocytopenic purpura ([place]) 03/18/2011    Past Surgical History:  Procedure Laterality Date  . BONE MARROW BIOPSY    . CARDIAC CATHETERIZATION    . CARPAL TUNNEL RELEASE    . CARPAL TUNNEL RELEASE Left 05/20/2016   Procedure: CARPAL TUNNEL RELEASE;  Surgeon: Carole Civil, MD;  Location: [facility] OR[facility];  Service: Orthopedics;  Laterality: Left;  . CARPAL TUNNEL RELEASE Right 07/16/2016   Procedure: RIGHT CARPAL TUNNEL RELEASE;  Surgeon: Carole Civil, MD;  Location: [facility] ORS;  Service: Orthopedics;  Laterality: Right;  .  CATARACT EXTRACTION    . CHOLECYSTECTOMY  2008  . COLONOSCOPY WITH ESOPHAGOGASTRODUODENOSCOPY (EGD) N/A 01/04/2013   Dr. Gala Romney: esophageal plaques with +KOH, hh. Gastric antrum abnormal, bx reactive gastropathy. Anal canal hemorrhoids, colonic tics, normal TI, single polyp (sessile serrated tubular adenoma). Next TCS 12/2017  . LEFT HEART CATH AND CORONARY ANGIOGRAPHY N/A 05/20/2017   Procedure: LEFT HEART CATH AND CORONARY ANGIOGRAPHY;  Surgeon: Martinique, Peter M, MD;  Location: [facility];  Service: Cardiovascular;  Laterality: N/A;  . 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 TEE WITHOUT CARDIOVERSION N/A 01/03/2016   Procedure: TRANSESOPHAGEAL ECHOCARDIOGRAM (TEE) WITH PROPOFOL;  Surgeon: Satira Sark, MD;  Location: [facility] SU[facility];  Service: Cardiovascular;  Laterality: N/A;     OB History    Gravida      Para      Term      Preterm      AB      Living  1     SAB      TAB      Ectopic      Multiple      Live Births               Home Medications    Prior to Admission medications   Medication Sig Start Date End Date Taking? Authorizing Provider  ALPRAZolam Duanne Moron) 0.5 MG tablet Take 1 tablet by mouth 3 (three) times daily.  11/02/17   [provider]  amLODipine (NORVASC) 10 MG tablet Take 1 tablet (10 mg total) by mouth daily for 30 days. 01/05/19 02/04/19  Manuella Ghazi, Pratik D, DO  aspirin EC 81 MG EC tablet Take 1 tablet (81 mg total) by mouth daily. Patient not taking: Reported on 12/31/2018 05/23/17   Eileen Stanford, PA-C  ciprofloxacin (CIPRO) 500 MG tablet Take 500 mg by mouth daily with breakfast.    [provider]  diclofenac sodium (VOLTAREN) 1 % GEL Apply 2 g topically 4 (four) times daily. 06/06/17   Debbe Odea, MD  HYDROcodone-acetaminophen (NORCO) 10-325 MG tablet Take 1 tablet by mouth every 4 (four) hours as needed. 11/02/17   [provider]  ipratropium-albuterol (DUONEB) 0.5-2.5 (3) MG/3ML SOLN Take 3 mLs by nebulization 3 (three)  times daily as needed (shorntess of breath/wheezing). Patient taking differently: Take 3 mLs by nebulization 3 (three) times daily as needed (shortness of breath/wheezing).  02/18/15   Kathie Dike, MD  isosorbide dinitrate (ISORDIL) 10 MG tablet Take 1 tablet (10 mg total) by mouth 2 (two) times daily for 30 days. 01/04/19 02/03/19  Manuella Ghazi, Pratik D, DO  metoprolol tartrate (LOPRESSOR) 25 MG tablet Take 1 tablet (25 mg total) by mouth 2 (two) times daily for 30 days. 01/04/19 02/03/19  Manuella Ghazi, Pratik D, DO  nitroGLYCERIN (NITROSTAT) 0.4 MG SL tablet Place 1 tablet (0.4 mg  total) under the tongue every 5 (five) minutes x 3 doses as needed for chest pain. 05/25/17   Delos Haring, PA-C  Oxycodone HCl 10 MG TABS Take 10 mg by mouth every 4 (four) hours as needed (for pain).  06/03/18   [provider]  PROAIR RESPICLICK 601 (90 BASE) MCG/ACT AEPB Inhale 2 puffs into the lungs every 4 (four) hours as needed (Shortness of breath).  12/27/14   [provider]  sertraline (ZOLOFT) 100 MG tablet Take 1 tablet by mouth at bedtime. 10/11/18   [provider]  sodium bicarbonate 650 MG tablet Take 1 tablet (650 mg total) by mouth 2 (two) times daily for 30 days. 01/04/19 02/03/19  Manuella Ghazi, Pratik D, DO  prochlorperazine (COMPAZINE) 10 MG tablet Take 10 mg by mouth as needed. For nausea   12/18/11  [provider]    Family History Family History  Problem Relation Age of Onset  . Cervical cancer Mother   . Lung cancer Father   . Colon polyps Father        ???  . Pneumonia Brother   . Colon cancer Paternal Grandmother 88  . AAA (abdominal aortic aneurysm) Paternal Grandmother   . Breast cancer Paternal Grandmother   . Colon cancer Paternal Grandfather 55  . Barrett's esophagus Paternal Aunt   . Stomach cancer Neg Hx   . Pancreatic cancer Neg Hx     Social History Social History   Tobacco Use  . Smoking status: Former Smoker    Years: 0.00    Types: Cigarettes    Last attempt to  quit: 02/14/2009    Years since quitting: 9.8  . Smokeless tobacco: Never Used  Substance Use Topics  . Alcohol use: No    Alcohol/week: 0.0 standard drinks  . Drug use: No     Allergies   Brintellix [vortioxetine]; Penicillins; Cymbalta [duloxetine hcl]; Yellow jacket venom; Adhesive [tape]; and Zoloft [sertraline hcl]   Review of Systems Review of Systems  Constitutional: Positive for fatigue and fever. Negative for chills.  HENT: Negative for congestion, rhinorrhea and sore throat.   Eyes: Negative for redness and visual disturbance.  Respiratory: Positive for cough and shortness of breath.   Cardiovascular: Negative for chest pain and leg swelling.  Gastrointestinal: Positive for abdominal distention. Negative for abdominal pain, diarrhea, nausea and vomiting.  Genitourinary: Positive for flank pain. Negative for dysuria.  Musculoskeletal: Positive for myalgias. Negative for back pain and neck pain.  Skin: Negative for rash.  Neurological: Negative for dizziness, light-headedness and headaches.  Hematological: Does not bruise/bleed easily.  Psychiatric/Behavioral: Negative for confusion.     Physical Exam Updated Vital Signs BP 123/82 (BP Location: Right Arm)   Pulse 74   Temp 97.9 F (36.6 C) (Oral)   Resp (!) 26   Ht 1.651 m ('5\' 5"'$ )   Wt 86.6 kg   LMP 08/13/2011   SpO2 97%   BMI 31.77 kg/m   Physical Exam Vitals signs and nursing note reviewed.  Constitutional:      General: She is not in acute distress.    Appearance: Normal appearance. She is well-developed. She is not toxic-appearing.  HENT:     Head: Normocephalic and atraumatic.  Eyes:     Extraocular Movements: Extraocular movements intact.     Conjunctiva/sclera: Conjunctivae normal.     Pupils: Pupils are equal, round, and reactive to light.  Neck:     Musculoskeletal: Normal range of motion and neck supple.  Cardiovascular:  Rate and Rhythm: Normal rate and regular rhythm.     Heart sounds:  No murmur.  Pulmonary:     Effort: Pulmonary effort is normal. No respiratory distress.     Breath sounds: Normal breath sounds. No wheezing, rhonchi or rales.     Comments: Tachypneic Abdominal:     Palpations: Abdomen is soft.     Tenderness: There is no abdominal tenderness.  Musculoskeletal: Normal range of motion.  Skin:    General: Skin is warm and dry.  Neurological:     General: No focal deficit present.     Mental Status: She is alert and oriented to person, place, and time.      ED Treatments / Results  Labs (all labs ordered are listed, but only abnormal results are displayed) Labs Reviewed  CULTURE, BLOOD (ROUTINE X 2)  CULTURE, BLOOD (ROUTINE X 2)  LACTIC ACID, PLASMA  LACTIC ACID, PLASMA  COMPREHENSIVE METABOLIC PANEL  CBC WITH DIFFERENTIAL/PLATELET  URINALYSIS, ROUTINE W REFLEX MICROSCOPIC    EKG EKG Interpretation  Date/Time:  Friday January 06 2019 21:50:36 EDT Ventricular Rate:  78 PR Interval:    QRS Duration: 138 QT Interval:  435 QTC Calculation: 496 R Axis:   104 Text Interpretation:  Sinus rhythm Nonspecific intraventricular conduction delay Probable anterolateral infarct, age indeterm No significant change since last tracing Confirmed by [name] [phone]) on 01/06/2019 10:02:50 PM   Radiology No results found.  Procedures Procedures (including critical care time)  Medications Ordered in ED Medications  0.9 %  sodium chloride infusion (has no administration in time range)     Initial Impression / Assessment and Plan / ED Course  I have reviewed the triage vital signs and the nursing notes.  Pertinent labs & imaging results that were available during my care of the patient were reviewed by me and considered in my medical decision making (see chart for details).       Patient nontoxic here.  Begin good story for fairly significant fever.  This does raise some concerns with her other symptoms of a cough and a little bit of  shortness of breath and body aches and fatigue that there may not necessarily be kidney infection or kidney abscess secondary to biopsy but possibly could be Aclovate infection.  Based on this we will go ahead and CT her belly to rule out the kidney.  We will also do CT chest without because it minimizes radiology's exposure.  And prevents the patient having to go back to the CT scan pending await final regular chest x-ray.  Patient's vital signs here not meeting sepsis criteria.  But sepsis order set used.  Will not start broad-spectrum antibiotics at this point in time.  Patient's EKG does show an interventricular conduction delay.  This is been present in the past.  Patient was just admitted on April 3 for acute kidney injury on top of her chronic kidney failure.  Staff and radiology warned about the potential concern for covert infection.   Final Clinical Impressions(s) / ED Diagnoses   Final diagnoses:  None    ED Discharge Orders    None       [name], MD 01/06/19 2220

## 2019-01-07 ENCOUNTER — Encounter (HOSPITAL_COMMUNITY): Payer: Self-pay | Admitting: Internal Medicine

## 2019-01-07 DIAGNOSIS — E871 Hypo-osmolality and hyponatremia: Secondary | ICD-10-CM | POA: Diagnosis not present

## 2019-01-07 DIAGNOSIS — Z86711 Personal history of pulmonary embolism: Secondary | ICD-10-CM | POA: Diagnosis not present

## 2019-01-07 DIAGNOSIS — Z01810 Encounter for preprocedural cardiovascular examination: Secondary | ICD-10-CM | POA: Diagnosis not present

## 2019-01-07 DIAGNOSIS — R0600 Dyspnea, unspecified: Secondary | ICD-10-CM | POA: Diagnosis not present

## 2019-01-07 DIAGNOSIS — Z9081 Acquired absence of spleen: Secondary | ICD-10-CM | POA: Diagnosis not present

## 2019-01-07 DIAGNOSIS — E872 Acidosis: Secondary | ICD-10-CM | POA: Diagnosis not present

## 2019-01-07 DIAGNOSIS — E119 Type 2 diabetes mellitus without complications: Secondary | ICD-10-CM

## 2019-01-07 DIAGNOSIS — L7632 Postprocedural hematoma of skin and subcutaneous tissue following other procedure: Secondary | ICD-10-CM | POA: Diagnosis not present

## 2019-01-07 DIAGNOSIS — D693 Immune thrombocytopenic purpura: Secondary | ICD-10-CM | POA: Diagnosis not present

## 2019-01-07 DIAGNOSIS — J9601 Acute respiratory failure with hypoxia: Secondary | ICD-10-CM

## 2019-01-07 DIAGNOSIS — Z4901 Encounter for fitting and adjustment of extracorporeal dialysis catheter: Secondary | ICD-10-CM | POA: Diagnosis not present

## 2019-01-07 DIAGNOSIS — R05 Cough: Secondary | ICD-10-CM

## 2019-01-07 DIAGNOSIS — R059 Cough, unspecified: Secondary | ICD-10-CM

## 2019-01-07 DIAGNOSIS — I252 Old myocardial infarction: Secondary | ICD-10-CM | POA: Diagnosis not present

## 2019-01-07 DIAGNOSIS — R109 Unspecified abdominal pain: Secondary | ICD-10-CM | POA: Diagnosis present

## 2019-01-07 DIAGNOSIS — E8770 Fluid overload, unspecified: Secondary | ICD-10-CM | POA: Diagnosis not present

## 2019-01-07 DIAGNOSIS — I5023 Acute on chronic systolic (congestive) heart failure: Secondary | ICD-10-CM | POA: Diagnosis not present

## 2019-01-07 DIAGNOSIS — Y848 Other medical procedures as the cause of abnormal reaction of the patient, or of later complication, without mention of misadventure at the time of the procedure: Secondary | ICD-10-CM | POA: Diagnosis present

## 2019-01-07 DIAGNOSIS — N99840 Postprocedural hematoma of a genitourinary system organ or structure following a genitourinary system procedure: Secondary | ICD-10-CM | POA: Diagnosis present

## 2019-01-07 DIAGNOSIS — R509 Fever, unspecified: Secondary | ICD-10-CM | POA: Diagnosis present

## 2019-01-07 DIAGNOSIS — R591 Generalized enlarged lymph nodes: Secondary | ICD-10-CM | POA: Diagnosis not present

## 2019-01-07 DIAGNOSIS — I132 Hypertensive heart and chronic kidney disease with heart failure and with stage 5 chronic kidney disease, or end stage renal disease: Secondary | ICD-10-CM | POA: Diagnosis not present

## 2019-01-07 DIAGNOSIS — N170 Acute kidney failure with tubular necrosis: Secondary | ICD-10-CM | POA: Diagnosis not present

## 2019-01-07 DIAGNOSIS — J90 Pleural effusion, not elsewhere classified: Secondary | ICD-10-CM | POA: Diagnosis not present

## 2019-01-07 DIAGNOSIS — N179 Acute kidney failure, unspecified: Secondary | ICD-10-CM | POA: Diagnosis not present

## 2019-01-07 DIAGNOSIS — R319 Hematuria, unspecified: Secondary | ICD-10-CM | POA: Diagnosis not present

## 2019-01-07 DIAGNOSIS — M109 Gout, unspecified: Secondary | ICD-10-CM | POA: Diagnosis not present

## 2019-01-07 DIAGNOSIS — N185 Chronic kidney disease, stage 5: Secondary | ICD-10-CM

## 2019-01-07 DIAGNOSIS — N3289 Other specified disorders of bladder: Secondary | ICD-10-CM | POA: Diagnosis not present

## 2019-01-07 DIAGNOSIS — N183 Chronic kidney disease, stage 3 (moderate): Secondary | ICD-10-CM | POA: Diagnosis not present

## 2019-01-07 DIAGNOSIS — I5041 Acute combined systolic (congestive) and diastolic (congestive) heart failure: Secondary | ICD-10-CM | POA: Diagnosis not present

## 2019-01-07 DIAGNOSIS — I361 Nonrheumatic tricuspid (valve) insufficiency: Secondary | ICD-10-CM | POA: Diagnosis not present

## 2019-01-07 DIAGNOSIS — F329 Major depressive disorder, single episode, unspecified: Secondary | ICD-10-CM | POA: Diagnosis present

## 2019-01-07 DIAGNOSIS — I2510 Atherosclerotic heart disease of native coronary artery without angina pectoris: Secondary | ICD-10-CM | POA: Diagnosis not present

## 2019-01-07 DIAGNOSIS — R6889 Other general symptoms and signs: Secondary | ICD-10-CM | POA: Diagnosis not present

## 2019-01-07 DIAGNOSIS — N2889 Other specified disorders of kidney and ureter: Secondary | ICD-10-CM | POA: Diagnosis not present

## 2019-01-07 DIAGNOSIS — I129 Hypertensive chronic kidney disease with stage 1 through stage 4 chronic kidney disease, or unspecified chronic kidney disease: Secondary | ICD-10-CM | POA: Diagnosis not present

## 2019-01-07 DIAGNOSIS — R0602 Shortness of breath: Secondary | ICD-10-CM | POA: Diagnosis not present

## 2019-01-07 DIAGNOSIS — Z20828 Contact with and (suspected) exposure to other viral communicable diseases: Secondary | ICD-10-CM | POA: Diagnosis present

## 2019-01-07 DIAGNOSIS — E669 Obesity, unspecified: Secondary | ICD-10-CM | POA: Diagnosis present

## 2019-01-07 DIAGNOSIS — K5730 Diverticulosis of large intestine without perforation or abscess without bleeding: Secondary | ICD-10-CM | POA: Diagnosis not present

## 2019-01-07 DIAGNOSIS — Z992 Dependence on renal dialysis: Secondary | ICD-10-CM | POA: Diagnosis not present

## 2019-01-07 DIAGNOSIS — S37012A Minor contusion of left kidney, initial encounter: Secondary | ICD-10-CM | POA: Diagnosis not present

## 2019-01-07 DIAGNOSIS — N186 End stage renal disease: Secondary | ICD-10-CM | POA: Diagnosis not present

## 2019-01-07 DIAGNOSIS — I10 Essential (primary) hypertension: Secondary | ICD-10-CM | POA: Diagnosis not present

## 2019-01-07 DIAGNOSIS — K219 Gastro-esophageal reflux disease without esophagitis: Secondary | ICD-10-CM | POA: Diagnosis not present

## 2019-01-07 DIAGNOSIS — D62 Acute posthemorrhagic anemia: Secondary | ICD-10-CM | POA: Diagnosis not present

## 2019-01-07 DIAGNOSIS — E782 Mixed hyperlipidemia: Secondary | ICD-10-CM | POA: Diagnosis not present

## 2019-01-07 DIAGNOSIS — D631 Anemia in chronic kidney disease: Secondary | ICD-10-CM | POA: Diagnosis not present

## 2019-01-07 DIAGNOSIS — D508 Other iron deficiency anemias: Secondary | ICD-10-CM | POA: Diagnosis not present

## 2019-01-07 DIAGNOSIS — F419 Anxiety disorder, unspecified: Secondary | ICD-10-CM | POA: Diagnosis present

## 2019-01-07 LAB — URINALYSIS, ROUTINE W REFLEX MICROSCOPIC
Bilirubin Urine: NEGATIVE
Glucose, UA: NEGATIVE mg/dL
Ketones, ur: NEGATIVE mg/dL
Nitrite: NEGATIVE
Protein, ur: 100 mg/dL — AB
Specific Gravity, Urine: 1.009 (ref 1.005–1.030)
pH: 5 (ref 5.0–8.0)

## 2019-01-07 LAB — C-REACTIVE PROTEIN: CRP: 5.9 mg/dL — ABNORMAL HIGH (ref <1.0)

## 2019-01-07 LAB — COMPREHENSIVE METABOLIC PANEL
ALT: 11 U/L (ref 0–44)
AST: 16 U/L (ref 15–41)
Albumin: 3.2 g/dL — ABNORMAL LOW (ref 3.5–5.0)
Alkaline Phosphatase: 60 U/L (ref 38–126)
Anion gap: 12 (ref 5–15)
BUN: 71 mg/dL — ABNORMAL HIGH (ref 6–20)
CO2: 17 mmol/L — ABNORMAL LOW (ref 22–32)
Calcium: 7.8 mg/dL — ABNORMAL LOW (ref 8.9–10.3)
Chloride: 107 mmol/L (ref 98–111)
Creatinine, Ser: 6.82 mg/dL — ABNORMAL HIGH (ref 0.44–1.00)
GFR calc Af Amer: 7 mL/min — ABNORMAL LOW (ref >60)
GFR calc non Af Amer: 6 mL/min — ABNORMAL LOW (ref >60)
Glucose, Bld: 91 mg/dL (ref 70–99)
Potassium: 4.1 mmol/L (ref 3.5–5.1)
Sodium: 136 mmol/L (ref 135–145)
Total Bilirubin: 0.7 mg/dL (ref 0.3–1.2)
Total Protein: 6.7 g/dL (ref 6.5–8.1)

## 2019-01-07 LAB — RESPIRATORY PANEL BY PCR

## 2019-01-07 LAB — IRON AND TIBC
Iron: 12 ug/dL — ABNORMAL LOW (ref 28–170)
Saturation Ratios: 5 % — ABNORMAL LOW (ref 10.4–31.8)
TIBC: 249 ug/dL — ABNORMAL LOW (ref 250–450)
UIBC: 237 ug/dL

## 2019-01-07 LAB — CBC WITH DIFFERENTIAL/PLATELET
Abs Immature Granulocytes: 0.02 10*3/uL (ref 0.00–0.07)
Basophils Absolute: 0 10*3/uL (ref 0.0–0.1)
Basophils Relative: 1 %
Eosinophils Absolute: 0.2 10*3/uL (ref 0.0–0.5)
Eosinophils Relative: 2 %
HCT: 28.6 % — ABNORMAL LOW (ref 36.0–46.0)
Hemoglobin: 8.8 g/dL — ABNORMAL LOW (ref 12.0–15.0)
Immature Granulocytes: 0 %
Lymphocytes Relative: 22 %
Lymphs Abs: 2 10*3/uL (ref 0.7–4.0)
MCH: 28.2 pg (ref 26.0–34.0)
MCHC: 30.8 g/dL (ref 30.0–36.0)
MCV: 91.7 fL (ref 80.0–100.0)
Monocytes Absolute: 0.7 10*3/uL (ref 0.1–1.0)
Monocytes Relative: 8 %
Neutro Abs: 5.9 10*3/uL (ref 1.7–7.7)
Neutrophils Relative %: 67 %
Platelets: 190 10*3/uL (ref 150–400)
RBC: 3.12 MIL/uL — ABNORMAL LOW (ref 3.87–5.11)
RDW: 16.4 % — ABNORMAL HIGH (ref 11.5–15.5)
WBC: 8.8 10*3/uL (ref 4.0–10.5)
nRBC: 0 % (ref 0.0–0.2)

## 2019-01-07 LAB — TROPONIN I
Troponin I: 0.09 ng/mL (ref <0.03)
Troponin I: 0.07 ng/mL (ref <0.03)
Troponin I: 0.07 ng/mL (ref <0.03)

## 2019-01-07 LAB — INFLUENZA PANEL BY PCR (TYPE A & B)
Influenza A By PCR: NEGATIVE
Influenza B By PCR: NEGATIVE

## 2019-01-07 LAB — D-DIMER, QUANTITATIVE (NOT AT ARMC): D-Dimer, Quant: 2.16 ug/mL-FEU — ABNORMAL HIGH (ref 0.00–0.50)

## 2019-01-07 LAB — FERRITIN: Ferritin: 273 ng/mL (ref 11–307)

## 2019-01-07 LAB — LACTATE DEHYDROGENASE: LDH: 232 U/L — ABNORMAL HIGH (ref 98–192)

## 2019-01-07 LAB — PROCALCITONIN: Procalcitonin: <0.1 ng/mL

## 2019-01-07 LAB — CK: Total CK: 34 U/L — ABNORMAL LOW (ref 38–234)

## 2019-01-07 LAB — SEDIMENTATION RATE: Sed Rate: 61 mm/hr — ABNORMAL HIGH (ref 0–22)

## 2019-01-07 MED ORDER — ALBUTEROL SULFATE HFA 108 (90 BASE) MCG/ACT IN AERS
2.0000 | INHALATION_SPRAY | RESPIRATORY_TRACT | Status: DC | PRN
  Administered 2019-01-07: 2 via RESPIRATORY_TRACT
  Filled 2019-01-07: qty 6.7

## 2019-01-07 MED ORDER — SODIUM CHLORIDE 0.9 % IV SOLN
250.0000 mL | INTRAVENOUS | Status: DC | PRN
  Administered 2019-01-11: 250 mL via INTRAVENOUS

## 2019-01-07 MED ORDER — AMLODIPINE BESYLATE 10 MG PO TABS
10.0000 mg | ORAL_TABLET | Freq: Every day | ORAL | Status: DC
  Administered 2019-01-07 – 2019-01-10 (×4): 10 mg via ORAL
  Filled 2019-01-07 (×6): qty 1

## 2019-01-07 MED ORDER — SODIUM CHLORIDE 0.9% FLUSH
3.0000 mL | Freq: Two times a day (BID) | INTRAVENOUS | Status: DC
  Administered 2019-01-07 – 2019-01-20 (×18): 3 mL via INTRAVENOUS

## 2019-01-07 MED ORDER — SERTRALINE HCL 100 MG PO TABS
100.0000 mg | ORAL_TABLET | Freq: Every day | ORAL | Status: DC
  Administered 2019-01-08 – 2019-01-20 (×13): 100 mg via ORAL
  Filled 2019-01-07 (×13): qty 1

## 2019-01-07 MED ORDER — DEXTROSE 5 % IV SOLN
500.0000 mg | INTRAVENOUS | Status: DC
  Administered 2019-01-07 – 2019-01-09 (×3): 500 mg via INTRAVENOUS
  Filled 2019-01-07 (×5): qty 0.5

## 2019-01-07 MED ORDER — ZINC SULFATE 220 (50 ZN) MG PO CAPS
220.0000 mg | ORAL_CAPSULE | Freq: Every day | ORAL | Status: DC
  Administered 2019-01-07 – 2019-01-09 (×3): 220 mg via ORAL
  Filled 2019-01-07 (×3): qty 1

## 2019-01-07 MED ORDER — OXYCODONE HCL 5 MG PO TABS
10.0000 mg | ORAL_TABLET | ORAL | Status: DC | PRN
  Administered 2019-01-07 – 2019-01-13 (×8): 10 mg via ORAL
  Filled 2019-01-07 (×9): qty 2

## 2019-01-07 MED ORDER — ISOSORBIDE DINITRATE 20 MG PO TABS
10.0000 mg | ORAL_TABLET | Freq: Two times a day (BID) | ORAL | Status: DC
  Administered 2019-01-07 – 2019-01-10 (×5): 10 mg via ORAL
  Filled 2019-01-07 (×10): qty 1

## 2019-01-07 MED ORDER — SERTRALINE HCL 100 MG PO TABS
100.0000 mg | ORAL_TABLET | Freq: Every day | ORAL | Status: DC
  Administered 2019-01-07: 100 mg via ORAL
  Filled 2019-01-07: qty 1

## 2019-01-07 MED ORDER — SODIUM BICARBONATE 650 MG PO TABS
650.0000 mg | ORAL_TABLET | Freq: Two times a day (BID) | ORAL | Status: DC
  Administered 2019-01-07 – 2019-01-08 (×3): 650 mg via ORAL
  Filled 2019-01-07 (×8): qty 1

## 2019-01-07 MED ORDER — MORPHINE SULFATE (PF) 4 MG/ML IV SOLN
4.0000 mg | Freq: Once | INTRAVENOUS | Status: AC
  Administered 2019-01-07: 4 mg via INTRAVENOUS
  Filled 2019-01-07: qty 1

## 2019-01-07 MED ORDER — ALPRAZOLAM 0.5 MG PO TABS
0.5000 mg | ORAL_TABLET | Freq: Three times a day (TID) | ORAL | Status: DC
  Administered 2019-01-07 – 2019-01-15 (×14): 0.5 mg via ORAL
  Filled 2019-01-07 (×17): qty 1

## 2019-01-07 MED ORDER — SODIUM CHLORIDE 0.9 % IV SOLN
1.0000 g | Freq: Once | INTRAVENOUS | Status: AC
  Administered 2019-01-07: 02:00:00 1 g via INTRAVENOUS
  Filled 2019-01-07: qty 10

## 2019-01-07 MED ORDER — VITAMIN C 500 MG PO TABS
500.0000 mg | ORAL_TABLET | Freq: Every day | ORAL | Status: DC
  Administered 2019-01-07 – 2019-01-09 (×3): 500 mg via ORAL
  Filled 2019-01-07 (×3): qty 1

## 2019-01-07 MED ORDER — GUAIFENESIN ER 600 MG PO TB12
1200.0000 mg | ORAL_TABLET | Freq: Two times a day (BID) | ORAL | Status: DC
  Administered 2019-01-07 – 2019-01-20 (×26): 1200 mg via ORAL
  Filled 2019-01-07 (×27): qty 2

## 2019-01-07 MED ORDER — SODIUM CHLORIDE 0.9% FLUSH: 3.0000 mL | INTRAVENOUS | Status: DC | PRN

## 2019-01-07 MED ORDER — HYDROMORPHONE HCL 1 MG/ML IJ SOLN
0.5000 mg | INTRAMUSCULAR | Status: DC | PRN
  Administered 2019-01-07 – 2019-01-13 (×5): 0.5 mg via INTRAVENOUS
  Filled 2019-01-07: qty 1
  Filled 2019-01-07: qty 0.5
  Filled 2019-01-07: qty 1
  Filled 2019-01-07: qty 0.5
  Filled 2019-01-07: qty 1

## 2019-01-07 MED ORDER — METOPROLOL TARTRATE 25 MG PO TABS
25.0000 mg | ORAL_TABLET | Freq: Two times a day (BID) | ORAL | Status: DC
  Administered 2019-01-07 – 2019-01-10 (×7): 25 mg via ORAL
  Filled 2019-01-07 (×8): qty 1

## 2019-01-07 NOTE — Progress Notes (Signed)
Patient was admitted early this morning after midnight and H&P has been reviewed and I am in current agreement with assessment and plan done by Dr. Jani Gravel.  Additional changes plan have been made accordingly.  The patient is a 53 year old Caucasian female with a past medical history significant for but not limited to pretension, CAD, GERD, ITP status post splenectomy, history of PE in 2013, history of CKD stage III and recent AKI on CKD who underwent a kidney biopsy of 01/03/2019 with subsequent left flank pain since discharge associated with a fever and a cough.  She contacted nephrology and she was told to present to the ED and she reports that she had a fever 103 with a slightly productive cough.  Is unclear if she had yellowish sputum but denies any other complaints except back pain.  In the ED she underwent a chest abdomen pelvis CT scan which showed cardiomegaly and bilateral pleural effusions with smooth septal thickening consistent with pulmonary edema.  There is also patchy bilateral groundglass opacities in both lungs that are nonspecific and could have represented a viral pneumonia or COVID-19 disease. CT of the Abdomen and Pelvis also showed a recent left renal biopsy with a moderate-sized left extrarenal peri-nephric hematoma with a small foci of air within the hematoma raising a ? About superimposed infection.  Admitted for her fever and blood cultures and urine culture were obtained and she was suspected to have COVID-19 disease secondary to her CT of the chest findings.  She has been admitted for the following and is being treated for:  Fever ? perinephric abscess and Hematoma vs. COVID-19 -Blood culture x2 showed NGTD <12 Hours; Procalcitonin was <10 -Checking COVID Labs: CRP was 5.9, ESR was 61,  Ferritin was 273, CK was 34, LDH was 232, IL-6 pending, D-Dimer was 2.16 -Check COVID-19 SARS-CoV-2 -Urinalysis moderate hemoglobin, 100 protein, rare bacteria, hyaline casts present, 6-10 RBCs per  high-power field, 0 to 5 g epithelial cells, 11-20 WBCs and Trace Leukocytes -Received Ceftriaxone but then was started on IV Cefepime empirically pharmacy to dose -Influenza A/B via PCR Negative and Respiratory Virus Panel Negative  -Continue to Monitor and Trend D-Dimer -Will Consider addition of vanco if urine culture + staph  -Continue to Monitor Temperature Curve and Cultures   Acute Respiratory Failure with Hypoxia -Continue Supplemental O2 via [place] and Wean O2 as Tolerated -Continuous Pulse Oximetry and Maintain O2 Saturations >92% -As Above   L Flank Pain in the setting of hematoma vs perinephric abscess -Hold Aspirin -Monitor Hgb; Patient's Hb/Hct went from 8.0/25.6 -> 8.8/28.6 -C/w Dilaudid 0.52m iv q4h prn  -Nephrology consulted and appreciate their input   CAD -Continue  Isosorbide 179mpo TID -ASA currently being Held  -C/w Metoprolol 2567mo bid  AKI on CKD stage3, now CKD stage5 Non-Gap Metabolic Acidosis  -BUN/Cr is now gone from 74/6.93 -> 71/6.82 -Continue with Sodium Bicarbonate 650m54m bid -Nephrology Consulted for further evaluation and recommendations and Dr. WebbJustin Mendated patient has a baseline serum creatinine of 1.5 and exposure to ciprofloxacin along with hematuria..  Dr. WhitDema Severincussed the case with Dr. 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 twice daily along with amlodipine 10 mg p.o. daily and Isosorbide 10 mg po TID -Continue to Monitor Blood Pressures Carefully  Anxiety -Initially Held Zoloft 100mg45mqday due to ? Allergy per  computer but states it was causing diarrhea at higher doses  Normocytic Anemia -Patient's hemoglobin/hematocrit went from 8.0/25.6 is now 8.8/28.6 -Checked anemia panel and iron level was 12, U IBC was 237, TIBC was 249, saturation ratios 5%, ferritin  level was 273 -Continue monitor for signs due to bleeding -Repeat CBC in the a.m.  We will continue to monitor patient's clinical response to intervention and repeat blood work and imaging studies in the AM.  

## 2019-01-07 NOTE — Plan of Care (Signed)
  Problem: Clinical Measurements: Goal: Ability to maintain clinical measurements within normal limits will improve Outcome: Progressing   Problem: Clinical Measurements: Goal: Respiratory complications will improve Outcome: Progressing   Problem: Pain Managment: Goal: General experience of comfort will improve Outcome: Progressing   

## 2019-01-07 NOTE — Consult Note (Signed)
Referring Provider: No ref. provider found Primary Care Physician:  Redmond School, MD Primary Nephrologist:  Dr. Joelyn Oms  Reason for Consultation: Chronic kidney disease stage III status post renal biopsy 01/03/2019.  Considering acute kidney injury on chronic kidney disease secondary to antibiotic induced acute interstitial nephritis.  HPI: This is a 53 year old lady with a history of hypertension ITP, status post splenectomy in 2010.  She has a history of coronary artery disease with cardiac catheterization performed in 2018 has 100% distal PDA, she was last seen by Dr. Joelyn Oms 01/04/2019.  She had negative serological evaluation and underwent uneventful renal biopsy 01/03/2019.  She was asked to present to the emergency room after complaining of flank pain and fever.  Blood pressure 120/80 pulse 70 O2 sats 99% room air  Rocephin 1 dose administered for 07/2019, amlodipine 10 mg daily isosorbide 10 mg twice daily, metoprolol 25 mg twice daily, sodium bicarbonate 650 mg twice daily.    Labs sodium 136 potassium 4.1 chloride 107 CO2 17 glucose 91 creatinine 6.82 BUN 71 calcium 7.8 AST 16 ALT 11 LDH 232 WBC 8.8 hemoglobin 8.8 platelets 190.      Creatinine trend 06/29/2018 1.57    12/31/2018 6.79   01/01/2019  7.13   01/02/2019 7.02 01/03/2019 6.95   01/04/2019 6.8   01/06/2019   6.93     01/07/2019   6.82  CT scan chest showed cardiomegaly bilateral pleural effusions patchy groundglass opacities both lungs commonly reported with COVID-19 left renal biopsy with moderate size left extrarenal perinephric hematoma.   Past Medical History:  Diagnosis Date  . Allergy   . Anxiety   . Asthma   . CAD (coronary artery disease), native coronary artery    a. cath 05/20/17 - 100% distal PDA --> medical mangement (no intervention done)  . Candida esophagitis ([place])   . Chest pain at rest 04/2017  . Depression   . Essential hypertension, benign   . GERD (gastroesophageal reflux disease)   . Gout   . Hemorrhoids   .  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  Last was Prednisone '40mg'$ ; 2 wk in 10/2012.  She also was on Prednisone bridged with Cellcept briefly but stopped due to lack of insurance.   . Myocardial infarction ([place])   . Pulmonary embolism ([place]) 04/2012  . Renal insufficiency   . Serrated adenoma of colon   . Steroid-induced diabetes ([place])   . Stroke ([place])   . Thrush, oral 05/29/2011    Past Surgical History:  Procedure Laterality Date  . BONE MARROW BIOPSY    . CARDIAC CATHETERIZATION    . CARPAL TUNNEL RELEASE    . CARPAL TUNNEL RELEASE Left 05/20/2016   Procedure: CARPAL TUNNEL RELEASE;  Surgeon: Carole Civil, MD;  Location: [facility];  Service: Orthopedics;  Laterality: Left;  . CARPAL TUNNEL RELEASE Right 07/16/2016   Procedure: RIGHT CARPAL TUNNEL RELEASE;  Surgeon: Carole Civil, MD;  Location: [facility];  Service: Orthopedics;  Laterality: Right;  . CATARACT EXTRACTION    . CHOLECYSTECTOMY  2008  . COLONOSCOPY WITH ESOPHAGOGASTRODUODENOSCOPY (EGD) N/A 01/04/2013   Dr. Gala Romney: esophageal plaques with +KOH, hh. Gastric antrum abnormal, bx reactive gastropathy. Anal canal hemorrhoids, colonic tics, normal TI, single polyp (sessile serrated tubular adenoma). Next TCS 12/2017  . LEFT HEART CATH AND CORONARY ANGIOGRAPHY N/A 05/20/2017   Procedure: LEFT HEART CATH AND CORONARY ANGIOGRAPHY;  Surgeon: Martinique, Peter M, MD;  Location: [facility];  Service: Cardiovascular;  Laterality: N/A;  . 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 TEE WITHOUT CARDIOVERSION N/A 01/03/2016   Procedure: TRANSESOPHAGEAL ECHOCARDIOGRAM (TEE) WITH PROPOFOL;  Surgeon: Satira Sark, MD;  Location: [facility];  Service: Cardiovascular;  Laterality: N/A;    Prior to Admission medications   Medication Sig Start Date End Date Taking? Authorizing Provider  HYDROcodone-acetaminophen (NORCO) 10-325 MG tablet Take  1 tablet by mouth every 4 (four) hours as needed. 11/02/17  Yes [provider]  isosorbide dinitrate (ISORDIL) 10 MG tablet Take 1 tablet (10 mg total) by mouth 2 (two) times daily for 30 days. 01/04/19 02/03/19 Yes Shah, Pratik D, DO  metoprolol tartrate (LOPRESSOR) 25 MG tablet Take 1 tablet (25 mg total) by mouth 2 (two) times daily for 30 days. 01/04/19 02/03/19 Yes Shah, Pratik D, DO  sertraline (ZOLOFT) 100 MG tablet Take 1 tablet by mouth at bedtime. 10/11/18  Yes [provider]  sodium bicarbonate 650 MG tablet Take 1 tablet (650 mg total) by mouth 2 (two) times daily for 30 days. 01/04/19 02/03/19 Yes Shah, Pratik D, DO  ALPRAZolam Duanne Moron) 0.5 MG tablet Take 1 tablet by mouth 3 (three) times daily.  11/02/17   [provider]  amLODipine (NORVASC) 10 MG tablet Take 1 tablet (10 mg total) by mouth daily for 30 days. 01/05/19 02/04/19  Manuella Ghazi, Pratik D, DO  aspirin EC 81 MG EC tablet Take 1 tablet (81 mg total) by mouth daily. Patient not taking: Reported on 12/31/2018 05/23/17   Eileen Stanford, PA-C  ciprofloxacin (CIPRO) 500 MG tablet Take 500 mg by mouth daily with breakfast.    [provider]  diclofenac sodium (VOLTAREN) 1 % GEL Apply 2 g topically 4 (four) times daily. 06/06/17   Debbe Odea, MD  ipratropium-albuterol (DUONEB) 0.5-2.5 (3) MG/3ML SOLN Take 3 mLs by nebulization 3 (three) times daily as needed (shorntess of breath/wheezing). Patient taking differently: Take 3 mLs by nebulization 3 (three) times daily as needed (shortness of breath/wheezing).  02/18/15   Kathie Dike, MD  nitroGLYCERIN (NITROSTAT) 0.4 MG SL tablet Place 1 tablet (0.4 mg total) under the tongue every 5 (five) minutes x 3 doses as needed for chest pain. 05/25/17   Delos Haring, PA-C  Oxycodone HCl 10 MG TABS Take 10 mg by mouth every 4 (four) hours as needed (for pain).  06/03/18   [provider]  PROAIR RESPICLICK 017 (90 BASE) MCG/ACT AEPB Inhale 2 puffs into the lungs every 4  (four) hours as needed (Shortness of breath).  12/27/14   [provider]  prochlorperazine (COMPAZINE) 10 MG tablet Take 10 mg by mouth as needed. For nausea   12/18/11  [provider]    Current Facility-Administered Medications  Medication Dose Route Frequency Provider Last Rate Last Dose  . 0.9 %  sodium chloride infusion   Intravenous Continuous Jani Gravel, MD 50 mL/hr at 01/07/19 0947    . 0.9 %  sodium chloride infusion  250 mL Intravenous PRN Jani Gravel, MD      . albuterol (PROVENTIL HFA;VENTOLIN HFA) 108 (90 Base) MCG/ACT inhaler 2 puff  2 puff Inhalation Q4H PRN Jani Gravel, MD   2 puff at 01/07/19 1003  . ALPRAZolam Duanne Moron) tablet 0.5 mg  0.5 mg Oral TID Jani Gravel, MD   0.5 mg at 01/07/19 1000  . amLODipine (NORVASC) tablet 10 mg  10 mg Oral Daily Jani Gravel, MD   10 mg at  01/07/19 1000  . HYDROmorphone (DILAUDID) injection 0.5 mg  0.5 mg Intravenous Q4H PRN Jani Gravel, MD   0.5 mg at 01/07/19 0842  . isosorbide dinitrate (ISORDIL) tablet 10 mg  10 mg Oral BID Jani Gravel, MD   Stopped at 01/07/19 1001  . metoprolol tartrate (LOPRESSOR) tablet 25 mg  25 mg Oral BID Jani Gravel, MD   25 mg at 01/07/19 1000  . sodium bicarbonate tablet 650 mg  650 mg Oral BID Jani Gravel, MD   650 mg at 01/07/19 1000  . sodium chloride flush (NS) 0.9 % injection 3 mL  3 mL Intravenous Q12H Jani Gravel, MD   3 mL at 01/07/19 0949  . sodium chloride flush (NS) 0.9 % injection 3 mL  3 mL Intravenous PRN Jani Gravel, MD        Allergies as of 01/06/2019 - Review Complete 01/06/2019  Allergen Reaction Noted  . Brintellix [vortioxetine] Itching 10/28/2014  . Penicillins Itching 04/28/2018  . Cymbalta [duloxetine hcl] Itching 04/29/2018  . Yellow jacket venom Swelling 08/19/2011  . Adhesive [tape] Rash 01/19/2015  . Zoloft [sertraline hcl] Diarrhea 07/20/2016    Family History  Problem Relation Age of Onset  . Cervical cancer Mother   . Lung cancer Father   . Colon polyps Father         ???  . Pneumonia Brother   . Colon cancer Paternal Grandmother 88  . AAA (abdominal aortic aneurysm) Paternal Grandmother   . Breast cancer Paternal Grandmother   . Colon cancer Paternal Grandfather 41  . Barrett's esophagus Paternal Aunt   . Stomach cancer Neg Hx   . Pancreatic cancer Neg Hx     Social History   Socioeconomic History  . Marital status: Divorced    Spouse name: Not on file  . Number of children: 1  . Years of education: Not on file  . 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   Social Needs  . Financial resource strain: Not hard at all  . Food insecurity:    Worry: Sometimes true    Inability: Sometimes true  . Transportation needs:    Medical: No    Non-medical: No  Tobacco Use  . Smoking status: Former Smoker    Years: 0.00    Types: Cigarettes    Last attempt to quit: 02/14/2009    Years since quitting: 9.9  . Smokeless tobacco: Never Used  Substance and Sexual Activity  . Alcohol use: No    Alcohol/week: 0.0 standard drinks  . Drug use: No  . Sexual activity: Never  Lifestyle  . Physical activity:    Days per week: 2 days    Minutes per session: 30 min  . Stress: Rather much  Relationships  . Social connections:    Talks on phone: More than three times a week    Gets together: More than three times a week    Attends religious service: Never    Active member of club or organization: No    Attends meetings of clubs or organizations: Never    Relationship status: Divorced  . Intimate partner violence:    Fear of current or ex partner: No    Emotionally abused: No    Physically abused: No    Forced sexual activity: No  Other Topics Concern  . Not on file  Social History Narrative   Lives with her  daughter and aunt in a one story home.  Has 1 daughter.  On disability.  Did work as a Optometrist and bus Geophysicist/field seismologist.  Education: some college.     Review  of Systems: Gen: Denies any fever, chills, sweats, anorexia, fatigue, weakness, malaise, weight loss, and sleep disorder HEENT: No visual complaints, No history of Retinopathy. Normal external appearance No Epistaxis or Sore throat. No sinusitis.   CV: Denies chest pain, angina, palpitations, syncope, orthopnea, PND, peripheral edema, and claudication. Resp: Denies dyspnea at rest, dyspnea with exercise, cough, sputum, wheezing, coughing up blood, and pleurisy. GI: Denies vomiting blood, jaundice, and fecal incontinence.   Denies dysphagia or odynophagia. GU : Denies urinary burning, blood in urine, urinary frequency, urinary hesitancy, nocturnal urination, and urinary incontinence.  No renal calculi. MS: Denies joint pain, limitation of movement, and swelling, stiffness, low back pain, extremity pain. Denies muscle weakness, cramps, atrophy.  No use of non steroidal antiinflammatory drugs. Derm: Denies rash, itching, dry skin, hives, moles, warts, or unhealing ulcers.  Psych: Denies depression, anxiety, memory loss, suicidal ideation, hallucinations, paranoia, and confusion. Heme: Denies bruising, bleeding, and enlarged lymph nodes. Neuro: No headache.  No diplopia. No dysarthria.  No dysphasia.  No history of CVA.  No Seizures. No paresthesias.  No weakness. Endocrine No DM.  No Thyroid disease.  No Adrenal disease.  Physical Exam: Vital signs in last 24 hours: Temp:  [97.9 F (36.6 C)] 97.9 F (36.6 C) (04/10 2118) Pulse Rate:  [67-81] 78 (04/11 0942) Resp:  [18-30] 22 (04/11 0942) BP: (115-137)/(82-116) 133/116 (04/11 0942) SpO2:  [90 %-100 %] 100 % (04/11 0942) Weight:  [86.6 kg] 86.6 kg (04/10 2116)   General:   Alert,  Well-developed, well-nourished, pleasant and cooperative in NAD Head:  Normocephalic and atraumatic. Eyes:  Sclera clear, no icterus.   Conjunctiva pink. Ears:  Normal auditory acuity. Nose:  No deformity, discharge,  or lesions. Mouth:  No deformity or lesions,  dentition normal. Neck:  Supple; no masses or thyromegaly. JVP not elevated Lungs: Diminished breath sounds at bases no wheezes or rales Heart:  Regular rate and rhythm; no murmurs, clicks, rubs,  or gallops. Abdomen:  Soft, nontender and nondistended. No masses, hepatosplenomegaly or hernias noted. Normal bowel sounds, without guarding, and without rebound.   Msk:  Symmetrical without gross deformities. Normal posture. Pulses:  No carotid, renal, femoral bruits. DP and PT symmetrical and equal Extremities:  Without clubbing or edema. Neurologic:  Alert and  oriented x4;  grossly normal neurologically. Skin:  Intact without significant lesions or rashes.   Intake/Output from previous day: No intake/output data recorded. Intake/Output this shift: No intake/output data recorded.  Lab Results: Recent Labs    01/06/19 2156 01/07/19 0827  WBC 7.8 8.8  HGB 8.0* 8.8*  HCT 25.6* 28.6*  PLT 188 190   BMET Recent Labs    01/06/19 2156 01/07/19 0827  NA 134* 136  K 4.1 4.1  CL 105 107  CO2 17* 17*  GLUCOSE 126* 91  BUN 74* 71*  CREATININE 6.93* 6.82*  CALCIUM 7.7* 7.8*   LFT Recent Labs    01/07/19 0827  PROT 6.7  ALBUMIN 3.2*  AST 16  ALT 11  ALKPHOS 60  BILITOT 0.7   PT/INR No results for input(s): LABPROT, INR in the last 72 hours. Hepatitis Panel No results for input(s): HEPBSAG, HCVAB, HEPAIGM, HEPBIGM in the last 72 hours.  Studies/Results: Ct Chest Wo Contrast  Result Date: 01/07/2019 CLINICAL DATA:  Pyelonephritis,  complicated; Acute resp illness, > 64 years old Cough, persistent. Left flank pain. Patient reports renal biopsy 3 days ago. Fever, shortness of breath and flu-like symptoms. EXAM: CT CHEST, ABDOMEN AND PELVIS WITHOUT CONTRAST TECHNIQUE: Multidetector CT imaging of the chest, abdomen and pelvis was performed following the standard protocol without IV contrast. COMPARISON:  Chest radiograph performed concurrently. Abdominal CT 12/31/2018 FINDINGS: CT  CHEST FINDINGS Cardiovascular: Mild multi chamber cardiomegaly. Trace pericardial fluid. Thoracic aorta is normal in caliber. Mediastinum/Nodes: Multiple prominent mediastinal nodes. Limited assessment for hilar adenopathy given lack of IV contrast. Esophagus is decompressed. No visualized thyroid nodule. Lungs/Pleura: Small to moderate bilateral pleural effusions. Associated compressive atelectasis. There is smooth septal thickening consistent with pulmonary edema. Multifocal patchy ground-glass opacities, asymmetric throughout both lungs. Trachea mainstem bronchi are patent. Musculoskeletal: Chronic mild T10 superior endplate compression fracture. There are no acute or suspicious osseous abnormalities. CT ABDOMEN PELVIS FINDINGS Hepatobiliary: Elongated left lobe of the liver. No focal hepatic lesion on noncontrast exam. Insert: Pancreas: No ductal dilatation or inflammation. Spleen: Splenectomy. Adrenals/Urinary Tract: Normal adrenal glands. High-density left perinephric collection spanning 2.9 cm in depth and 9.8 cm in length most consistent with hematoma in the setting of recent kidney biopsy. Small foci of gas within the collection inferiorly. No evidence of intrarenal fluid collection. No significant mass effect on the left kidney. No hydronephrosis. No right hydronephrosis. Both ureters are decompressed. Urinary bladder is minimally distended. Questionable bladder wall thickening. Stomach/Bowel: Bowel evaluation is limited in the absence of enteric contrast. Ingested material in the stomach. No evidence of bowel wall thickening, inflammatory change, or obstruction. The duodenal jejunal junction is not well-defined on this noncontrast exam. Colonic diverticulosis without diverticulitis. Normal appendix tentatively identified. Vascular/Lymphatic: Normal caliber thoracic aorta. Tab attic left common iliac artery at 19 mm, unchanged allowing for differences in caliper placement. No definite enlarged lymph nodes  in the abdomen or pelvis. Reproductive: Uterus and bilateral adnexa are unremarkable. Other: Subcutaneous flank soft tissue edema, left greater than right. Small left lateral lumbar hernia contains only fat, unchanged. No ascites or hemoperitoneum. Minimal edema in the presacral space. Musculoskeletal: Prominent Schmorl's node loss of height T12, unchanged. Degenerative change in the lumbar spine. 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 IMPRESSION: 1. Cardiomegaly with bilateral pleural effusions. Smooth septal thickening consistent with pulmonary edema. 2. There are patchy bilateral ground-glass opacities in both lungs that are nonspecific in the setting, this may represent alveolar edema infectious or inflammatory etiologies. Imaging features are atypical or on commonly reported with COVID-19 or viral pneumonia. Alternative diagnosis should be considered. 3. Recent left renal biopsy with moderate-sized left extrarenal perinephric hematoma. Small foci of air within the hematoma is likely related to recent biopsy, however raises the possibility of superimposed infection. 4. Colonic diverticulosis without diverticulitis. Electronically Signed   By: Keith Rake M.D.   On: 01/07/2019 00:09   Dg Chest Port 1 View  Result Date: 01/06/2019 CLINICAL DATA:  Fever. EXAM: PORTABLE CHEST 1 VIEW COMPARISON:  Radiograph 06/29/2018. FINDINGS: Cardiomegaly, new from prior exam. Bilateral pleural effusions, also new. Vascular congestion without overt pulmonary edema. Bibasilar opacities associated with pleural effusions typically atelectasis. No pneumothorax. IMPRESSION: Cardiomegaly with vascular congestion and small pleural effusions. Findings consistent with CHF. Electronically Signed   By: Keith Rake M.D.   On: 01/06/2019 23:26   Ct Renal Stone Study  Result Date: 01/07/2019 CLINICAL DATA:  Pyelonephritis, complicated; Acute resp illness, > 48 years old Cough, persistent. Left flank pain.  Patient reports  renal biopsy 3 days ago. Fever, shortness of breath and flu-like symptoms. EXAM: CT CHEST, ABDOMEN AND PELVIS WITHOUT CONTRAST TECHNIQUE: Multidetector CT imaging of the chest, abdomen and pelvis was performed following the standard protocol without IV contrast. COMPARISON:  Chest radiograph performed concurrently. Abdominal CT 12/31/2018 FINDINGS: CT CHEST FINDINGS Cardiovascular: Mild multi chamber cardiomegaly. Trace pericardial fluid. Thoracic aorta is normal in caliber. Mediastinum/Nodes: Multiple prominent mediastinal nodes. Limited assessment for hilar adenopathy given lack of IV contrast. Esophagus is decompressed. No visualized thyroid nodule. Lungs/Pleura: Small to moderate bilateral pleural effusions. Associated compressive atelectasis. There is smooth septal thickening consistent with pulmonary edema. Multifocal patchy ground-glass opacities, asymmetric throughout both lungs. Trachea mainstem bronchi are patent. Musculoskeletal: Chronic mild T10 superior endplate compression fracture. There are no acute or suspicious osseous abnormalities. CT ABDOMEN PELVIS FINDINGS Hepatobiliary: Elongated left lobe of the liver. No focal hepatic lesion on noncontrast exam. Insert: Pancreas: No ductal dilatation or inflammation. Spleen: Splenectomy. Adrenals/Urinary Tract: Normal adrenal glands. High-density left perinephric collection spanning 2.9 cm in depth and 9.8 cm in length most consistent with hematoma in the setting of recent kidney biopsy. Small foci of gas within the collection inferiorly. No evidence of intrarenal fluid collection. No significant mass effect on the left kidney. No hydronephrosis. No right hydronephrosis. Both ureters are decompressed. Urinary bladder is minimally distended. Questionable bladder wall thickening. Stomach/Bowel: Bowel evaluation is limited in the absence of enteric contrast. Ingested material in the stomach. No evidence of bowel wall thickening, inflammatory  change, or obstruction. The duodenal jejunal junction is not well-defined on this noncontrast exam. Colonic diverticulosis without diverticulitis. Normal appendix tentatively identified. Vascular/Lymphatic: Normal caliber thoracic aorta. Tab attic left common iliac artery at 19 mm, unchanged allowing for differences in caliper placement. No definite enlarged lymph nodes in the abdomen or pelvis. Reproductive: Uterus and bilateral adnexa are unremarkable. Other: Subcutaneous flank soft tissue edema, left greater than right. Small left lateral lumbar hernia contains only fat, unchanged. No ascites or hemoperitoneum. Minimal edema in the presacral space. Musculoskeletal: Prominent Schmorl's node loss of height T12, unchanged. Degenerative change in the lumbar spine. 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 IMPRESSION: 1. Cardiomegaly with bilateral pleural effusions. Smooth septal thickening consistent with pulmonary edema. 2. There are patchy bilateral ground-glass opacities in both lungs that are nonspecific in the setting, this may represent alveolar edema infectious or inflammatory etiologies. Imaging features are atypical or on commonly reported with COVID-19 or viral pneumonia. Alternative diagnosis should be considered. 3. Recent left renal biopsy with moderate-sized left extrarenal perinephric hematoma. Small foci of air within the hematoma is likely related to recent biopsy, however raises the possibility of superimposed infection. 4. Colonic diverticulosis without diverticulitis. Electronically Signed   By: Keith Rake M.D.   On: 01/07/2019 00:09    Assessment/Plan:  Nonoliguric acute on chronic renal insufficiency with a baseline serum creatinine 1.5 exposure to Cipro admitted with hematuria and pyuria.  I just discussed with Dr. Joelyn Oms and the results of her renal biopsy were consistent with a TMA secondary to hypotension.  Treatment for this would be to focus on her  hypertension control.  Hypertension appears to be controlled at this present time.  We will continue to follow  Flulike symptoms and chest CT with groundglass appearance.  Concerning for COVID-19  Chronic ITP stable status post splenectomy 2010  Anemia T sats appeared good 20% we will recheck and consider starting ESA  Metabolic acidosis continue sodium bicarbonate   LOS: 0 Sherril Croon @  TODAY'@10'$ :32 AM

## 2019-01-07 NOTE — H&P (Addendum)
TRH H&P    Patient Demographics:    Summer Cook, is a 53 y.o. female  MRN: [id number]  DOB - [birth year]  Admit Date - 01/06/2019  Referring MD/NP/PA: Thayer Jew  Outpatient Primary MD for the patient is Redmond School, MD  Patient coming from:  home  Chief complaint-   Left flank pain, fever, cough   HPI:    Summer Cook  is a 53 y.o. female, w  with a history of hypertension, CAD, GERD, ITP status post splenectomy, history of PE in 2013, w CKD stage3, and recent AKI (suspect cipro induced AIN r/o AGN) w kidney biopsy on 01/03/2019 apparently has had left flank pain since discharge.  Pt  apparently contacted nephrology and was told to present to ED  Pt states has had fever to 103, and slight productive cough.  Unclear if yellow? Sputum.  Pt denies cp, palp, sob, n/v, abd pain,  diarrhea, brbpr, black stool, dysuria, hematuria.    In Ed,  T 97.9, P 81  R 18, Bp 115/85  Pox 90-99% Wt 86.6 kg  CT chest/ abd/ pelvis IMPRESSION: 1. Cardiomegaly with bilateral pleural effusions. Smooth septal thickening consistent with pulmonary edema. 2. There are patchy bilateral ground-glass opacities in both lungs that are nonspecific in the setting, this may represent alveolar edema infectious or inflammatory etiologies. Imaging features are atypical or on commonly reported with COVID-19 or viral pneumonia. Alternative diagnosis should be considered. 3. Recent left renal biopsy with moderate-sized left extrarenal perinephric hematoma. Small foci of air within the hematoma is likely related to recent biopsy, however raises the possibility of superimposed infection. 4. Colonic diverticulosis without diverticulitis.  Na 134, K 4.1,  Bun 74, Creatinine 6.93 Glucose 126 Alb 3.2 Ast 17, Alt 12  Wbc 7.8, hgb 8.0 Plt 188 La 0.8  Urinalysis pending Blood culture x2 pending  Pt will be admitted for fever, ?  Perinephric hematoma vs infection, r/o Covid due to CT Chest (ground glass)   Review of systems:    In addition to the HPI above,    No Headache, No changes with Vision or hearing, No problems swallowing food or Liquids, No Chest pain, No Shortness of Breath, No Abdominal pain, No Nausea or Vomiting, bowel movements are regular, No Blood in stool or Urine, No dysuria, No new skin rashes or bruises, No new joints pains-aches,  No new weakness, tingling, numbness in any extremity, No recent weight gain or loss, No polyuria, polydypsia or polyphagia, No significant Mental Stressors.  All other systems reviewed and are negative.    Past History of the following :    Past Medical History:  Diagnosis Date  . Allergy   . Anxiety   . Asthma   . CAD (coronary artery disease), native coronary artery    a. cath 05/20/17 - 100% distal PDA --> medical mangement (no intervention done)  . Candida esophagitis ([place])   . Chest pain at rest 04/2017  . Depression   . Essential hypertension, benign   . GERD (gastroesophageal reflux disease)   .  Gout   . Hemorrhoids   . 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  Last was Prednisone '40mg'$ ; 2 wk in 10/2012.  She also was on Prednisone bridged with Cellcept briefly but stopped due to lack of insurance.   . Myocardial infarction ([place])   . Pulmonary embolism ([place]) 04/2012  . Renal insufficiency   . Serrated adenoma of colon   . Steroid-induced diabetes ([place])   . Stroke ([place])   . Thrush, oral 05/29/2011      Past Surgical History:  Procedure Laterality Date  . BONE MARROW BIOPSY    . CARDIAC CATHETERIZATION    . CARPAL TUNNEL RELEASE    . CARPAL TUNNEL RELEASE Left 05/20/2016   Procedure: CARPAL TUNNEL RELEASE;  Surgeon: Carole Civil, MD;  Location: [facility];  Service: Orthopedics;  Laterality: Left;  . CARPAL TUNNEL RELEASE Right  07/16/2016   Procedure: RIGHT CARPAL TUNNEL RELEASE;  Surgeon: Carole Civil, MD;  Location: [facility];  Service: Orthopedics;  Laterality: Right;  . CATARACT EXTRACTION    . CHOLECYSTECTOMY  2008  . COLONOSCOPY WITH ESOPHAGOGASTRODUODENOSCOPY (EGD) N/A 01/04/2013   Dr. Gala Romney: esophageal plaques with +KOH, hh. Gastric antrum abnormal, bx reactive gastropathy. Anal canal hemorrhoids, colonic tics, normal TI, single polyp (sessile serrated tubular adenoma). Next TCS 12/2017  . LEFT HEART CATH AND CORONARY ANGIOGRAPHY N/A 05/20/2017   Procedure: LEFT HEART CATH AND CORONARY ANGIOGRAPHY;  Surgeon: Martinique, Peter M, MD;  Location: [facility];  Service: Cardiovascular;  Laterality: N/A;  . 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 TEE WITHOUT CARDIOVERSION N/A 01/03/2016   Procedure: TRANSESOPHAGEAL ECHOCARDIOGRAM (TEE) WITH PROPOFOL;  Surgeon: Satira Sark, MD;  Location: [facility];  Service: Cardiovascular;  Laterality: N/A;      Social History:      Social History   Tobacco Use  . Smoking status: Former Smoker    Years: 0.00    Types: Cigarettes    Last attempt to quit: 02/14/2009    Years since quitting: 9.9  . Smokeless tobacco: Never Used  Substance Use Topics  . Alcohol use: No    Alcohol/week: 0.0 standard drinks       Family History :     Family History  Problem Relation Age of Onset  . Cervical cancer Mother   . Lung cancer Father   . Colon polyps Father        ???  . Pneumonia Brother   . Colon cancer Paternal Grandmother 88  . AAA (abdominal aortic aneurysm) Paternal Grandmother   . Breast cancer Paternal Grandmother   . Colon cancer Paternal Grandfather 5  . Barrett's esophagus Paternal Aunt   . Stomach cancer Neg Hx   . Pancreatic cancer Neg Hx        Home Medications:   Prior to Admission medications   Medication Sig Start Date End Date Taking? Authorizing Provider  ALPRAZolam Duanne Moron) 0.5 MG tablet Take 1 tablet by mouth 3 (three) times daily.  11/02/17    [provider]  amLODipine (NORVASC) 10 MG tablet Take 1 tablet (10 mg total) by mouth daily for 30 days. 01/05/19 02/04/19  Manuella Ghazi, Pratik D, DO  aspirin EC 81 MG EC tablet Take 1 tablet (81 mg total) by mouth daily. Patient not taking: Reported on 12/31/2018 05/23/17   Eileen Stanford, PA-C  ciprofloxacin (CIPRO) 500 MG tablet Take 500 mg by mouth daily with breakfast.  [provider]  diclofenac sodium (VOLTAREN) 1 % GEL Apply 2 g topically 4 (four) times daily. 06/06/17   Debbe Odea, MD  HYDROcodone-acetaminophen (NORCO) 10-325 MG tablet Take 1 tablet by mouth every 4 (four) hours as needed. 11/02/17   [provider]  ipratropium-albuterol (DUONEB) 0.5-2.5 (3) MG/3ML SOLN Take 3 mLs by nebulization 3 (three) times daily as needed (shorntess of breath/wheezing). Patient taking differently: Take 3 mLs by nebulization 3 (three) times daily as needed (shortness of breath/wheezing).  02/18/15   Kathie Dike, MD  isosorbide dinitrate (ISORDIL) 10 MG tablet Take 1 tablet (10 mg total) by mouth 2 (two) times daily for 30 days. 01/04/19 02/03/19  Manuella Ghazi, Pratik D, DO  metoprolol tartrate (LOPRESSOR) 25 MG tablet Take 1 tablet (25 mg total) by mouth 2 (two) times daily for 30 days. 01/04/19 02/03/19  Manuella Ghazi, Pratik D, DO  nitroGLYCERIN (NITROSTAT) 0.4 MG SL tablet Place 1 tablet (0.4 mg total) under the tongue every 5 (five) minutes x 3 doses as needed for chest pain. 05/25/17   Delos Haring, PA-C  Oxycodone HCl 10 MG TABS Take 10 mg by mouth every 4 (four) hours as needed (for pain).  06/03/18   [provider]  PROAIR RESPICLICK 553 (90 BASE) MCG/ACT AEPB Inhale 2 puffs into the lungs every 4 (four) hours as needed (Shortness of breath).  12/27/14   [provider]  sertraline (ZOLOFT) 100 MG tablet Take 1 tablet by mouth at bedtime. 10/11/18   [provider]  sodium bicarbonate 650 MG tablet Take 1 tablet (650 mg total) by mouth 2 (two) times daily for 30 days.  01/04/19 02/03/19  Manuella Ghazi, Pratik D, DO  prochlorperazine (COMPAZINE) 10 MG tablet Take 10 mg by mouth as needed. For nausea   12/18/11  [provider]     Allergies:     Allergies  Allergen Reactions  . Brintellix [Vortioxetine] Itching  . Penicillins Itching  . Cymbalta [Duloxetine Hcl] Itching  . Yellow [place]et Venom Swelling  . Adhesive [Tape] Rash    Please use paper tape  . Zoloft [Sertraline Hcl] Diarrhea     Physical Exam:   Vitals  Blood pressure (!) 128/98, pulse 70, temperature 97.9 F (36.6 C), temperature source Oral, resp. rate (!) 26, height '5\' 5"'$  (1.651 m), weight 86.6 kg, last menstrual period 08/13/2011, SpO2 93 %.  1.  General: Axox3,   2. Psychiatric: euthymic  3. Neurologic: cn2-12 intact, reflexes 2+ symmetric, diffuse with downgoing toes bilaterally, motor 5/5 in all 4 ext  4. HEENMT:  Anicteric, pupils 1.110m symmetric, direct , consensual, near intact  5. Respiratory : CTAB  6. Cardiovascular : rrr s1, s2, no m/g/r  7. Gastrointestinal:  Abd: soft, nt, nd, +bs Slight tenderness left flank  8. Skin:  Normal turgor, no c/c/e  9.Musculoskeletal: Good ROM    Data Review:    CBC Recent Labs  Lab 12/31/18 0437 01/04/19 0607 01/06/19 2156  WBC 6.7  --  7.8  HGB 9.3* 8.0* 8.0*  HCT 29.3* 25.4* 25.6*  PLT 142*  --  188  MCV 88.0  --  90.5  MCH 27.9  --  28.3  MCHC 31.7  --  31.3  RDW 15.9*  --  16.3*  LYMPHSABS  --   --  2.3  MONOABS  --   --  0.6  EOSABS  --   --  0.2  BASOSABS  --   --  0.1   ------------------------------------------------------------------------------------------------------------------  Results for orders  placed or performed during the hospital encounter of 01/06/19 (from the past 48 hour(s))  Lactic acid, plasma     Status: None   Collection Time: 01/06/19  9:56 PM  Result Value Ref Range   Lactic Acid, Venous 0.8 0.5 - 1.9 mmol/L    Comment: Performed at [facility], [address]  [place], [place] [address]  Comprehensive metabolic panel     Status: Abnormal   Collection Time: 01/06/19  9:56 PM  Result Value Ref Range   Sodium 134 (L) 135 - 145 mmol/L   Potassium 4.1 3.5 - 5.1 mmol/L   Chloride 105 98 - 111 mmol/L   CO2 17 (L) 22 - 32 mmol/L   Glucose, Bld 126 (H) 70 - 99 mg/dL   BUN 74 (H) 6 - 20 mg/dL   Creatinine, Ser 6.93 (H) 0.44 - 1.00 mg/dL   Calcium 7.7 (L) 8.9 - 10.3 mg/dL   Total Protein 6.3 (L) 6.5 - 8.1 g/dL   Albumin 3.2 (L) 3.5 - 5.0 g/dL   AST 17 15 - 41 U/L   ALT 12 0 - 44 U/L   Alkaline Phosphatase 57 38 - 126 U/L   Total Bilirubin 0.7 0.3 - 1.2 mg/dL   GFR calc non Af Amer 6 (L) >60 mL/min   GFR calc Af Amer 7 (L) >60 mL/min   Anion gap 12 5 - 15    Comment: Performed at [facility], [address] [place], [place] [address]  CBC WITH DIFFERENTIAL     Status: Abnormal   Collection Time: 01/06/19  9:56 PM  Result Value Ref Range   WBC 7.8 4.0 - 10.5 K/uL   RBC 2.83 (L) 3.87 - 5.11 MIL/uL   Hemoglobin 8.0 (L) 12.0 - 15.0 g/dL   HCT 25.6 (L) 36.0 - 46.0 %   MCV 90.5 80.0 - 100.0 fL   MCH 28.3 26.0 - 34.0 pg   MCHC 31.3 30.0 - 36.0 g/dL   RDW 16.3 (H) 11.5 - 15.5 %   Platelets 188 150 - 400 K/uL   nRBC 0.0 0.0 - 0.2 %   Neutrophils Relative % 59 %   Neutro Abs 4.6 1.7 - 7.7 K/uL   Lymphocytes Relative 29 %   Lymphs Abs 2.3 0.7 - 4.0 K/uL   Monocytes Relative 8 %   Monocytes Absolute 0.6 0.1 - 1.0 K/uL   Eosinophils Relative 3 %   Eosinophils Absolute 0.2 0.0 - 0.5 K/uL   Basophils Relative 1 %   Basophils Absolute 0.1 0.0 - 0.1 K/uL   Immature Granulocytes 0 %   Abs Immature Granulocytes 0.01 0.00 - 0.07 K/uL    Comment: Performed at [facility], [address] [place], [place] [address]  Blood Culture (routine x 2)     Status: None (Preliminary result)   Collection Time: 01/06/19  9:56 PM  Result Value Ref Range   Specimen Description RIGHT ANTECUBITAL    Special Requests      BOTTLES DRAWN AEROBIC AND ANAEROBIC Blood Culture  adequate volume Performed at [facility], [address], [place], [place] [address]    Culture PENDING    Report Status PENDING   Blood Culture (routine x 2)     Status: None (Preliminary result)   Collection Time: 01/06/19 10:00 PM  Result Value Ref Range   Specimen Description LEFT ANTECUBITAL    Special Requests      BOTTLES DRAWN AEROBIC AND ANAEROBIC Blood Culture adequate volume Performed at [facility], [address], [place], [place] [address]    Culture PENDING    Report Status PENDING     Chemistries  Recent Labs  Lab 12/31/18 0437 01/01/19 0505 01/02/19 0427 01/03/19 0452 01/04/19 0607 01/06/19 2156  NA 139 139 142 140 140 134*  K 3.2* 4.0 4.1 3.6 3.9 4.1  CL 112* 113* 115* 113* 113* 105  CO2 16* 16* 17* 17* 16* 17*  GLUCOSE 106* 91 86 89 94 126*  BUN 64* 65* 65* 69* 69* 74*  CREATININE 6.79* 7.13* 7.02* 6.95* 6.80* 6.93*  CALCIUM 7.9* 7.7* 7.6* 7.8* 7.7* 7.7*  AST 15  --   --   --   --  17  ALT 9  --   --   --   --  12  ALKPHOS 58  --   --   --   --  57  BILITOT 0.5  --   --   --   --  0.7   ------------------------------------------------------------------------------------------------------------------  ------------------------------------------------------------------------------------------------------------------ GFR: Estimated Creatinine Clearance: 10.2 mL/min (A) (by C-G formula based on SCr of 6.93 mg/dL (H)). Liver Function Tests: Recent Labs  Lab 12/31/18 0437 01/01/19 0505 01/02/19 0427 01/03/19 0452 01/04/19 0607 01/06/19 2156  AST 15  --   --   --   --  17  ALT 9  --   --   --   --  12  ALKPHOS 58  --   --   --   --  57  BILITOT 0.5  --   --   --   --  0.7  PROT 6.0*  --   --   --   --  6.3*  ALBUMIN 3.0* 2.8* 2.7* 2.7* 2.7* 3.2*   No results for input(s): LIPASE, AMYLASE in the last 168 hours. No results for input(s): AMMONIA in the last 168 hours. Coagulation Profile: Recent Labs  Lab 01/01/19 1227  INR 1.1   Cardiac  Enzymes: Recent Labs  Lab 12/31/18 0437 12/31/18 1117 12/31/18 1443  TROPONINI 0.12* 0.11* 0.10*   BNP (last 3 results) No results for input(s): PROBNP in the last 8760 hours. HbA1C: No results for input(s): HGBA1C in the last 72 hours. CBG: Recent Labs  Lab 01/03/19 1201  GLUCAP 71   Lipid Profile: No results for input(s): CHOL, HDL, LDLCALC, TRIG, CHOLHDL, LDLDIRECT in the last 72 hours. Thyroid Function Tests: No results for input(s): TSH, T4TOTAL, FREET4, T3FREE, THYROIDAB in the last 72 hours. Anemia Panel: No results for input(s): VITAMINB12, FOLATE, FERRITIN, TIBC, IRON, RETICCTPCT in the last 72 hours.  --------------------------------------------------------------------------------------------------------------- Urine analysis:    Component Value Date/Time   COLORURINE STRAW (A) 12/30/2018 2230   APPEARANCEUR CLEAR 12/30/2018 2230   LABSPEC 1.010 12/30/2018 2230   PHURINE 6.0 12/30/2018 2230   GLUCOSEU NEGATIVE 12/30/2018 2230   HGBUR MODERATE (A) 12/30/2018 2230   BILIRUBINUR NEGATIVE 12/30/2018 2230   KETONESUR 5 (A) 12/30/2018 2230   PROTEINUR 100 (A) 12/30/2018 2230   UROBILINOGEN 0.2 02/17/2015 1325   NITRITE NEGATIVE 12/30/2018 2230   LEUKOCYTESUR TRACE (A) 12/30/2018 2230      Imaging Results:    Ct Chest Wo Contrast  Result Date: 01/07/2019 CLINICAL DATA:  Pyelonephritis, complicated; Acute resp illness, > 72 years old Cough, persistent. Left flank pain. Patient reports renal biopsy 3 days ago. Fever, shortness of breath and flu-like symptoms. EXAM: CT CHEST, ABDOMEN AND PELVIS WITHOUT CONTRAST TECHNIQUE: Multidetector CT imaging of the chest, abdomen and pelvis was performed following the standard  protocol without IV contrast. COMPARISON:  Chest radiograph performed concurrently. Abdominal CT 12/31/2018 FINDINGS: CT CHEST FINDINGS Cardiovascular: Mild multi chamber cardiomegaly. Trace pericardial fluid. Thoracic aorta is normal in caliber.  Mediastinum/Nodes: Multiple prominent mediastinal nodes. Limited assessment for hilar adenopathy given lack of IV contrast. Esophagus is decompressed. No visualized thyroid nodule. Lungs/Pleura: Small to moderate bilateral pleural effusions. Associated compressive atelectasis. There is smooth septal thickening consistent with pulmonary edema. Multifocal patchy ground-glass opacities, asymmetric throughout both lungs. Trachea mainstem bronchi are patent. Musculoskeletal: Chronic mild T10 superior endplate compression fracture. There are no acute or suspicious osseous abnormalities. CT ABDOMEN PELVIS FINDINGS Hepatobiliary: Elongated left lobe of the liver. No focal hepatic lesion on noncontrast exam. Insert: Pancreas: No ductal dilatation or inflammation. Spleen: Splenectomy. Adrenals/Urinary Tract: Normal adrenal glands. High-density left perinephric collection spanning 2.9 cm in depth and 9.8 cm in length most consistent with hematoma in the setting of recent kidney biopsy. Small foci of gas within the collection inferiorly. No evidence of intrarenal fluid collection. No significant mass effect on the left kidney. No hydronephrosis. No right hydronephrosis. Both ureters are decompressed. Urinary bladder is minimally distended. Questionable bladder wall thickening. Stomach/Bowel: Bowel evaluation is limited in the absence of enteric contrast. Ingested material in the stomach. No evidence of bowel wall thickening, inflammatory change, or obstruction. The duodenal jejunal junction is not well-defined on this noncontrast exam. Colonic diverticulosis without diverticulitis. Normal appendix tentatively identified. Vascular/Lymphatic: Normal caliber thoracic aorta. Tab attic left common iliac artery at 19 mm, unchanged allowing for differences in caliper placement. No definite enlarged lymph nodes in the abdomen or pelvis. Reproductive: Uterus and bilateral adnexa are unremarkable. Other: Subcutaneous flank soft tissue  edema, left greater than right. Small left lateral lumbar hernia contains only fat, unchanged. No ascites or hemoperitoneum. Minimal edema in the presacral space. Musculoskeletal: Prominent Schmorl's node loss of height T12, unchanged. Degenerative change in the lumbar spine. 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 IMPRESSION: 1. Cardiomegaly with bilateral pleural effusions. Smooth septal thickening consistent with pulmonary edema. 2. There are patchy bilateral ground-glass opacities in both lungs that are nonspecific in the setting, this may represent alveolar edema infectious or inflammatory etiologies. Imaging features are atypical or on commonly reported with COVID-19 or viral pneumonia. Alternative diagnosis should be considered. 3. Recent left renal biopsy with moderate-sized left extrarenal perinephric hematoma. Small foci of air within the hematoma is likely related to recent biopsy, however raises the possibility of superimposed infection. 4. Colonic diverticulosis without diverticulitis. Electronically Signed   By: Keith Rake M.D.   On: 01/07/2019 00:09   Dg Chest Port 1 View  Result Date: 01/06/2019 CLINICAL DATA:  Fever. EXAM: PORTABLE CHEST 1 VIEW COMPARISON:  Radiograph 06/29/2018. FINDINGS: Cardiomegaly, new from prior exam. Bilateral pleural effusions, also new. Vascular congestion without overt pulmonary edema. Bibasilar opacities associated with pleural effusions typically atelectasis. No pneumothorax. IMPRESSION: Cardiomegaly with vascular congestion and small pleural effusions. Findings consistent with CHF. Electronically Signed   By: Keith Rake M.D.   On: 01/06/2019 23:26   Ct Renal Stone Study  Result Date: 01/07/2019 CLINICAL DATA:  Pyelonephritis, complicated; Acute resp illness, > 67 years old Cough, persistent. Left flank pain. Patient reports renal biopsy 3 days ago. Fever, shortness of breath and flu-like symptoms. EXAM: CT CHEST, ABDOMEN AND PELVIS  WITHOUT CONTRAST TECHNIQUE: Multidetector CT imaging of the chest, abdomen and pelvis was performed following the standard protocol without IV contrast. COMPARISON:  Chest radiograph performed concurrently. Abdominal CT 12/31/2018 FINDINGS: CT  CHEST FINDINGS Cardiovascular: Mild multi chamber cardiomegaly. Trace pericardial fluid. Thoracic aorta is normal in caliber. Mediastinum/Nodes: Multiple prominent mediastinal nodes. Limited assessment for hilar adenopathy given lack of IV contrast. Esophagus is decompressed. No visualized thyroid nodule. Lungs/Pleura: Small to moderate bilateral pleural effusions. Associated compressive atelectasis. There is smooth septal thickening consistent with pulmonary edema. Multifocal patchy ground-glass opacities, asymmetric throughout both lungs. Trachea mainstem bronchi are patent. Musculoskeletal: Chronic mild T10 superior endplate compression fracture. There are no acute or suspicious osseous abnormalities. CT ABDOMEN PELVIS FINDINGS Hepatobiliary: Elongated left lobe of the liver. No focal hepatic lesion on noncontrast exam. Insert: Pancreas: No ductal dilatation or inflammation. Spleen: Splenectomy. Adrenals/Urinary Tract: Normal adrenal glands. High-density left perinephric collection spanning 2.9 cm in depth and 9.8 cm in length most consistent with hematoma in the setting of recent kidney biopsy. Small foci of gas within the collection inferiorly. No evidence of intrarenal fluid collection. No significant mass effect on the left kidney. No hydronephrosis. No right hydronephrosis. Both ureters are decompressed. Urinary bladder is minimally distended. Questionable bladder wall thickening. Stomach/Bowel: Bowel evaluation is limited in the absence of enteric contrast. Ingested material in the stomach. No evidence of bowel wall thickening, inflammatory change, or obstruction. The duodenal jejunal junction is not well-defined on this noncontrast exam. Colonic diverticulosis  without diverticulitis. Normal appendix tentatively identified. Vascular/Lymphatic: Normal caliber thoracic aorta. Tab attic left common iliac artery at 19 mm, unchanged allowing for differences in caliper placement. No definite enlarged lymph nodes in the abdomen or pelvis. Reproductive: Uterus and bilateral adnexa are unremarkable. Other: Subcutaneous flank soft tissue edema, left greater than right. Small left lateral lumbar hernia contains only fat, unchanged. No ascites or hemoperitoneum. Minimal edema in the presacral space. Musculoskeletal: Prominent Schmorl's node loss of height T12, unchanged. Degenerative change in the lumbar spine. 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 IMPRESSION: 1. Cardiomegaly with bilateral pleural effusions. Smooth septal thickening consistent with pulmonary edema. 2. There are patchy bilateral ground-glass opacities in both lungs that are nonspecific in the setting, this may represent alveolar edema infectious or inflammatory etiologies. Imaging features are atypical or on commonly reported with COVID-19 or viral pneumonia. Alternative diagnosis should be considered. 3. Recent left renal biopsy with moderate-sized left extrarenal perinephric hematoma. Small foci of air within the hematoma is likely related to recent biopsy, however raises the possibility of superimposed infection. 4. Colonic diverticulosis without diverticulitis. Electronically Signed   By: Keith Rake M.D.   On: 01/07/2019 00:09       Assessment & Plan:    Principal Problem:   Fever Active Problems:   Diabetes mellitus type 2, uncomplicated (HCC)   CAD (coronary artery disease): total distal RCA EF normal (05/20/17)   CKD (chronic kidney disease), stage V (HCC)   Cough  Fever Ddx perinephric abcess, covid -19 Blood culture x2 Check crp, ferritin, cpk, LDH, IL-6 Check covid-19 Urinalysis pending Start cefepime iv empirically pharmacy to dose Consider addition of vanco if  urine culture + staph   L flank pain ddx hematoma vs perinephric abscess Hold Aspirin Monitor Hgb Dilaudid 0.'5mg'$  iv q4h prn  Please consult nephrology in AM  CAD Cont Isosorbide '10mg'$  po tid Cont metoprolol '25mg'$  po bid Cont Amlodipine '10mg'$  po qday  AKI on CKD stage3, now CKD stage5 Cont sodium bicarbonate '650mg'$  po bid Please consult nephrology in AM  Anxiety HOLD zoloft '100mg'$  po qday due to ? Allergy per computer    DVT Prophylaxis-     - SCDs  AM Labs Ordered, also please review Full Orders  Family Communication: Admission, patients condition and plan of care including tests being ordered have been discussed with the patient who indicate understanding and agree with the plan and Code Status.  Code Status:  FULL CODE  Admission status:  Inpatient: Based on patients clinical presentation and evaluation of above clinical data, I have made determination that patient meets Inpatient criteria at this time. Pt has fever of unclear origin, will need r/o covid due to ground glass on CT chest as well as fever , and also r/o perinephric abscess, and monitoring of hgb.  Time spent in minutes : 70   Jani Gravel M.D on 01/07/2019 at 3:02 AM

## 2019-01-07 NOTE — Progress Notes (Signed)
Pharmacy Antibiotic Note  Summer Cook is a 53 y.o. female admitted on 01/06/2019 with concern for COVID/HCAP. Given CTX x1 on admit.  Pharmacy has been consulted for cefepime dosing given her recent admission 01/03/2019. PCT <0.1, afebrile, WBC 8.8, CT with ground-glass opacities. CrCl ~10  Plan: Start cefepime 500mg  q24h Follow cultures, renal function, COVID testing  Height: 5\' 5"  (165.1 cm) Weight: 190 lb 14.7 oz (86.6 kg) IBW/kg (Calculated) : 57  Temp (24hrs), Avg:97.9 F (36.6 C), Min:97.9 F (36.6 C), Max:97.9 F (36.6 C)  Recent Labs  Lab 01/02/19 0427 01/03/19 0452 01/04/19 0607 01/06/19 2156 01/07/19 0827  WBC  --   --   --  7.8 8.8  CREATININE 7.02* 6.95* 6.80* 6.93* 6.82*  LATICACIDVEN  --   --   --  0.8  --     Estimated Creatinine Clearance: 10.4 mL/min (A) (by C-G formula based on SCr of 6.82 mg/dL (H)).    Allergies  Allergen Reactions  . Brintellix [Vortioxetine] Itching  . Penicillins Itching  . Cymbalta [Duloxetine Hcl] Itching  . Yellow [place]et Venom Swelling  . Adhesive [Tape] Rash    Please use paper tape  . Zoloft [Sertraline Hcl] Diarrhea    Antimicrobials this admission: 4/11 CTX >> 4/11 4/11 Cefepime >>   Microbiology results: 4/11 BCx:  4/11 RVP pending 4/4 UCx Neg 4/10 BCx NGTD  Thank you for allowing pharmacy to be a part of this patient's care.  Janae Bridgeman, PharmD PGY1 Pharmacy Resident Phone: [phone] 01/07/2019 10:55 AM

## 2019-01-08 ENCOUNTER — Inpatient Hospital Stay (HOSPITAL_COMMUNITY): Payer: Medicare Other

## 2019-01-08 LAB — RENAL FUNCTION PANEL
Albumin: 2.4 g/dL — ABNORMAL LOW (ref 3.5–5.0)
Anion gap: 15 (ref 5–15)
BUN: 70 mg/dL — ABNORMAL HIGH (ref 6–20)
CO2: 15 mmol/L — ABNORMAL LOW (ref 22–32)
Calcium: 7.7 mg/dL — ABNORMAL LOW (ref 8.9–10.3)
Chloride: 105 mmol/L (ref 98–111)
Creatinine, Ser: 7.26 mg/dL — ABNORMAL HIGH (ref 0.44–1.00)
GFR calc Af Amer: 7 mL/min — ABNORMAL LOW (ref >60)
GFR calc non Af Amer: 6 mL/min — ABNORMAL LOW (ref >60)
Glucose, Bld: 95 mg/dL (ref 70–99)
Phosphorus: 7.4 mg/dL — ABNORMAL HIGH (ref 2.5–4.6)
Potassium: 4.4 mmol/L (ref 3.5–5.1)
Sodium: 135 mmol/L (ref 135–145)

## 2019-01-08 LAB — SEDIMENTATION RATE: Sed Rate: 62 mm/hr — ABNORMAL HIGH (ref 0–22)

## 2019-01-08 LAB — HEPATIC FUNCTION PANEL
ALT: 10 U/L (ref 0–44)
AST: 12 U/L — ABNORMAL LOW (ref 15–41)
Albumin: 2.4 g/dL — ABNORMAL LOW (ref 3.5–5.0)
Alkaline Phosphatase: 49 U/L (ref 38–126)
Bilirubin, Direct: <0.1 mg/dL (ref 0.0–0.2)
Total Bilirubin: 0.7 mg/dL (ref 0.3–1.2)
Total Protein: 5.2 g/dL — ABNORMAL LOW (ref 6.5–8.1)

## 2019-01-08 LAB — CBC
HCT: 23.4 % — ABNORMAL LOW (ref 36.0–46.0)
Hemoglobin: 7.4 g/dL — ABNORMAL LOW (ref 12.0–15.0)
MCH: 28.5 pg (ref 26.0–34.0)
MCHC: 31.6 g/dL (ref 30.0–36.0)
MCV: 90 fL (ref 80.0–100.0)
Platelets: 168 10*3/uL (ref 150–400)
RBC: 2.6 MIL/uL — ABNORMAL LOW (ref 3.87–5.11)
RDW: 16.3 % — ABNORMAL HIGH (ref 11.5–15.5)
WBC: 10 10*3/uL (ref 4.0–10.5)
nRBC: 0 % (ref 0.0–0.2)

## 2019-01-08 LAB — C-REACTIVE PROTEIN: CRP: 9.7 mg/dL — ABNORMAL HIGH (ref <1.0)

## 2019-01-08 LAB — FERRITIN: Ferritin: 293 ng/mL (ref 11–307)

## 2019-01-08 LAB — MAGNESIUM: Magnesium: 2 mg/dL (ref 1.7–2.4)

## 2019-01-08 LAB — CK: Total CK: 27 U/L — ABNORMAL LOW (ref 38–234)

## 2019-01-08 LAB — TROPONIN I: Troponin I: 0.06 ng/mL (ref <0.03)

## 2019-01-08 LAB — D-DIMER, QUANTITATIVE: D-Dimer, Quant: 1.87 ug/mL-FEU — ABNORMAL HIGH (ref 0.00–0.50)

## 2019-01-08 LAB — INTERLEUKIN-6, PLASMA: Interleukin-6, Plasma: 79 pg/mL — ABNORMAL HIGH (ref 0.0–12.2)

## 2019-01-08 MED ORDER — FUROSEMIDE 10 MG/ML IJ SOLN
40.0000 mg | Freq: Once | INTRAMUSCULAR | Status: AC
  Administered 2019-01-08: 40 mg via INTRAVENOUS
  Filled 2019-01-08: qty 4

## 2019-01-08 MED ORDER — HYDROCOD POLST-CPM POLST ER 10-8 MG/5ML PO SUER
5.0000 mL | Freq: Two times a day (BID) | ORAL | Status: DC | PRN
  Administered 2019-01-08 – 2019-01-10 (×2): 5 mL via ORAL
  Filled 2019-01-08 (×2): qty 5

## 2019-01-08 MED ORDER — SODIUM CHLORIDE 0.9 % IV SOLN
125.0000 mg | Freq: Every day | INTRAVENOUS | Status: AC
  Administered 2019-01-08 – 2019-01-14 (×7): 125 mg via INTRAVENOUS
  Filled 2019-01-08 (×9): qty 10

## 2019-01-08 MED ORDER — SODIUM BICARBONATE 650 MG PO TABS
650.0000 mg | ORAL_TABLET | Freq: Three times a day (TID) | ORAL | Status: DC
  Administered 2019-01-08 – 2019-01-13 (×12): 650 mg via ORAL
  Filled 2019-01-08 (×12): qty 1

## 2019-01-08 MED ORDER — IPRATROPIUM-ALBUTEROL 20-100 MCG/ACT IN AERS
1.0000 | INHALATION_SPRAY | Freq: Four times a day (QID) | RESPIRATORY_TRACT | Status: DC | PRN
  Filled 2019-01-08: qty 4

## 2019-01-08 MED ORDER — IPRATROPIUM-ALBUTEROL 20-100 MCG/ACT IN AERS
1.0000 | INHALATION_SPRAY | Freq: Four times a day (QID) | RESPIRATORY_TRACT | Status: DC
  Administered 2019-01-08 – 2019-01-09 (×5): 1 via RESPIRATORY_TRACT
  Filled 2019-01-08: qty 4

## 2019-01-08 NOTE — Progress Notes (Signed)
Pt received 40mg  Lasix IV, per order. Put on purewick and unable to urinate. Got patient up to bsc and she was only able to produce 66ml. Messaged Dr. Chana Bode. Nephro is due to assess patient.   01/08/19 1610  Intake (mL)  P.O. 240 mL  0.9 %  sodium chloride infusion  Volume (mL) 308.68 mL  0.9 %  sodium chloride infusion  Volume (mL) 0 mL  Output (mL)  Urine 75 mL  Urine Characteristics  Bladder Scan Volume (mL) 51 mL

## 2019-01-08 NOTE — Progress Notes (Signed)
[facility] ROUNDING NOTE   Subjective:   This is a 53 year old lady history of hypertension ITP status post penectomy 2010 with coronary artery disease cardiac catheterization 2018 with 100% stenosis of the distal PDA.  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  She presented to the emergency room with flank pain and fever and is currently being ruled out for COVID-19.  Blood pressure 121/82 pulse 83 temperature 98.2 O2 sats 97% 2 L nasal cannula  Sodium 135 potassium 4.4 chloride 105 CO2 15 6 BUN 70 creatinine 7.26 glucose 95 calcium 7.7 phosphorus 7.4 magnesium 2.0 albumin 2.4 AST 12 ALT 10 CPK 27 iron saturations 5% WBC 10 hemoglobin 7.4 platelets 168  Cefepime 500 mg every 24 hours, amlodipine 10 mg daily, metoprolol 25 mg twice daily isosorbide 10 mg twice daily Zoloft 100 mg daily sodium bicarbonate 650 mg twice daily  Chest CT showed cardiomegaly bilateral pleural effusions groundglass opacities both lungs.   Objective:  Vital signs in last 24 hours:  Temp:  [98.2 F (36.8 C)-100.8 F (38.2 C)] 98.2 F (36.8 C) (04/12 1117) Pulse Rate:  [72-91] 79 (04/12 1117) Resp:  [14-31] 22 (04/12 1117) BP: (114-128)/(82-92) 121/82 (04/12 1117) SpO2:  [93 %-97 %] 97 % (04/12 1117)  Weight change:  Filed Weights   01/06/19 2116  Weight: 86.6 kg    Intake/Output: I/O last 3 completed shifts: In: 1480.5 [P.O.:480; I.V.:950.5; IV Piggyback:50] Out: 200 [Urine:200]   Intake/Output this shift:  Total I/O In: 947.1 [P.O.:240; I.V.:707.1] Out: -   CVS-regular rate and rhythm no murmurs rubs or gallops RS-diminished air entry at bases ABD- BS present soft non-distended EXT- no edema   Basic Metabolic Panel: Recent Labs  Lab 01/02/19 0427 01/03/19 0452 01/04/19 0607 01/06/19 2156 01/07/19 0827 01/08/19 0631  NA 142 140 140 134* 136 135  K 4.1 3.6 3.9 4.1 4.1  4.4  CL 115* 113* 113* 105 107 105  CO2 17* 17* 16* 17* 17* 15*  GLUCOSE 86 89 94 126* 91 95  BUN 65* 69* 69* 74* 71* 70*  CREATININE 7.02* 6.95* 6.80* 6.93* 6.82* 7.26*  CALCIUM 7.6* 7.8* 7.7* 7.7* 7.8* 7.7*  MG  --   --   --   --   --  2.0  PHOS 6.9* 6.7* 6.8*  --   --  7.4*    Liver Function Tests: Recent Labs  Lab 01/03/19 0452 01/04/19 0607 01/06/19 2156 01/07/19 0827 01/08/19 0631  AST  --   --  17 16 12*  ALT  --   --  12 11 10   ALKPHOS  --   --  57 60 49  BILITOT  --   --  0.7 0.7 0.7  PROT  --   --  6.3* 6.7 5.2*  ALBUMIN 2.7* 2.7* 3.2* 3.2* 2.4*  2.4*   No results for input(s): LIPASE, AMYLASE in the last 168 hours. No results for input(s): AMMONIA in the last 168 hours.  CBC: Recent Labs  Lab 01/04/19 0607 01/06/19 2156 01/07/19 0827 01/08/19 0631  WBC  --  7.8 8.8 10.0  NEUTROABS  --  4.6 5.9  --   HGB 8.0* 8.0* 8.8* 7.4*  HCT 25.4* 25.6* 28.6* 23.4*  MCV  --  90.5 91.7 90.0  PLT  --  188 190 168    Cardiac Enzymes: Recent Labs  Lab 01/07/19 0827 01/07/19 1119 01/07/19 1835 01/07/19 2255 01/08/19  0631  CKTOTAL  --  34*  --   --  27*  TROPONINI 0.09* 0.07* 0.07* 0.06*  --     BNP: Invalid input(s): POCBNP  CBG: Recent Labs  Lab 01/03/19 1201  GLUCAP 71    Microbiology: Results for orders placed or performed during the hospital encounter of 01/06/19  Blood Culture (routine x 2)     Status: None (Preliminary result)   Collection Time: 01/06/19  9:56 PM  Result Value Ref Range Status   Specimen Description RIGHT ANTECUBITAL  Final   Special Requests   Final    BOTTLES DRAWN AEROBIC AND ANAEROBIC Blood Culture adequate volume   Culture   Final    NO GROWTH 2 DAYS Performed at [facility], [address], [place], [place] [address]    Report Status PENDING  Incomplete  Blood Culture (routine x 2)     Status: None (Preliminary result)   Collection Time: 01/06/19 10:00 PM  Result Value Ref Range Status   Specimen Description LEFT  ANTECUBITAL  Final   Special Requests   Final    BOTTLES DRAWN AEROBIC AND ANAEROBIC Blood Culture adequate volume   Culture   Final    NO GROWTH 2 DAYS Performed at [facility], [address], [place], [place] [address]    Report Status PENDING  Incomplete  Respiratory Panel by PCR     Status: None   Collection Time: 01/07/19  7:20 AM  Result Value Ref Range Status   Adenovirus NOT DETECTED NOT DETECTED Final   Coronavirus 229E NOT DETECTED NOT DETECTED Final    Comment: (NOTE) The Coronavirus on the Respiratory Panel, DOES NOT test for the novel  Coronavirus (2019 nCoV)    Coronavirus HKU1 NOT DETECTED NOT DETECTED Final   Coronavirus NL63 NOT DETECTED NOT DETECTED Final   Coronavirus OC43 NOT DETECTED NOT DETECTED Final   Metapneumovirus NOT DETECTED NOT DETECTED Final   Rhinovirus / Enterovirus NOT DETECTED NOT DETECTED Final   Influenza A NOT DETECTED NOT DETECTED Final   Influenza B NOT DETECTED NOT DETECTED Final   Parainfluenza Virus 1 NOT DETECTED NOT DETECTED Final   Parainfluenza Virus 2 NOT DETECTED NOT DETECTED Final   Parainfluenza Virus 3 NOT DETECTED NOT DETECTED Final   Parainfluenza Virus 4 NOT DETECTED NOT DETECTED Final   Respiratory Syncytial Virus NOT DETECTED NOT DETECTED Final   Bordetella pertussis NOT DETECTED NOT DETECTED Final   Chlamydophila pneumoniae NOT DETECTED NOT DETECTED Final   Mycoplasma pneumoniae NOT DETECTED NOT DETECTED Final    Comment: Performed at [facility], [place] [address], [place], [place] [address]    Coagulation Studies: No results for input(s): LABPROT, INR in the last 72 hours.  Urinalysis: Recent Labs    01/06/19 2151  COLORURINE YELLOW  LABSPEC 1.009  PHURINE 5.0  GLUCOSEU NEGATIVE  HGBUR MODERATE*  BILIRUBINUR NEGATIVE  KETONESUR NEGATIVE  PROTEINUR 100*  NITRITE NEGATIVE  LEUKOCYTESUR TRACE*      Imaging: Ct Chest Wo Contrast  Result Date: 01/07/2019 CLINICAL DATA:  Pyelonephritis, complicated;  Acute resp illness, > 80 years old Cough, persistent. Left flank pain. Patient reports renal biopsy 3 days ago. Fever, shortness of breath and flu-like symptoms. EXAM: CT CHEST, ABDOMEN AND PELVIS WITHOUT CONTRAST TECHNIQUE: Multidetector CT imaging of the chest, abdomen and pelvis was performed following the standard protocol without IV contrast. COMPARISON:  Chest radiograph performed concurrently. Abdominal CT 12/31/2018 FINDINGS: CT CHEST FINDINGS Cardiovascular: Mild multi chamber cardiomegaly. Trace pericardial  fluid. Thoracic aorta is normal in caliber. Mediastinum/Nodes: Multiple prominent mediastinal nodes. Limited assessment for hilar adenopathy given lack of IV contrast. Esophagus is decompressed. No visualized thyroid nodule. Lungs/Pleura: Small to moderate bilateral pleural effusions. Associated compressive atelectasis. There is smooth septal thickening consistent with pulmonary edema. Multifocal patchy ground-glass opacities, asymmetric throughout both lungs. Trachea mainstem bronchi are patent. Musculoskeletal: Chronic mild T10 superior endplate compression fracture. There are no acute or suspicious osseous abnormalities. CT ABDOMEN PELVIS FINDINGS Hepatobiliary: Elongated left lobe of the liver. No focal hepatic lesion on noncontrast exam. Insert: Pancreas: No ductal dilatation or inflammation. Spleen: Splenectomy. Adrenals/Urinary Tract: Normal adrenal glands. High-density left perinephric collection spanning 2.9 cm in depth and 9.8 cm in length most consistent with hematoma in the setting of recent kidney biopsy. Small foci of gas within the collection inferiorly. No evidence of intrarenal fluid collection. No significant mass effect on the left kidney. No hydronephrosis. No right hydronephrosis. Both ureters are decompressed. Urinary bladder is minimally distended. Questionable bladder wall thickening. Stomach/Bowel: Bowel evaluation is limited in the absence of enteric contrast. Ingested material  in the stomach. No evidence of bowel wall thickening, inflammatory change, or obstruction. The duodenal jejunal junction is not well-defined on this noncontrast exam. Colonic diverticulosis without diverticulitis. Normal appendix tentatively identified. Vascular/Lymphatic: Normal caliber thoracic aorta. Tab attic left common iliac artery at 19 mm, unchanged allowing for differences in caliper placement. No definite enlarged lymph nodes in the abdomen or pelvis. Reproductive: Uterus and bilateral adnexa are unremarkable. Other: Subcutaneous flank soft tissue edema, left greater than right. Small left lateral lumbar hernia contains only fat, unchanged. No ascites or hemoperitoneum. Minimal edema in the presacral space. Musculoskeletal: Prominent Schmorl's node loss of height T12, unchanged. Degenerative change in the lumbar spine. 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 IMPRESSION: 1. Cardiomegaly with bilateral pleural effusions. Smooth septal thickening consistent with pulmonary edema. 2. There are patchy bilateral ground-glass opacities in both lungs that are nonspecific in the setting, this may represent alveolar edema infectious or inflammatory etiologies. Imaging features are atypical or on commonly reported with COVID-19 or viral pneumonia. Alternative diagnosis should be considered. 3. Recent left renal biopsy with moderate-sized left extrarenal perinephric hematoma. Small foci of air within the hematoma is likely related to recent biopsy, however raises the possibility of superimposed infection. 4. Colonic diverticulosis without diverticulitis. Electronically Signed   By: Keith Rake M.D.   On: 01/07/2019 00:09   Dg Chest Port 1 View  Result Date: 01/08/2019 CLINICAL DATA:  Cough, chest congestion and shortness of breath. Follow-up CHF versus pneumonia. EXAM: PORTABLE CHEST 1 VIEW COMPARISON:  CT chest and chest x-ray 01/06/2019 and earlier. FINDINGS: Cardiac silhouette mildly  enlarged, unchanged. Pulmonary vascularity now normal. Developing patchy airspace opacities in the RIGHT UPPER LOBE. Moderate to large BILATERAL pleural effusions, increased in size on the RIGHT and unchanged on the LEFT, with associated dense consolidation in the lower lobes. IMPRESSION: 1. Developing pneumonia involving the RIGHT upper lobe. 2. Moderate to large BILATERAL pleural effusions, increased in size on the RIGHT and unchanged on the LEFT. Associated dense BILATERAL lower lobe atelectasis and/or pneumonia. 3. Stable mild cardiomegaly without pulmonary edema currently as the pulmonary vascularity is now normal. Electronically Signed   By: Evangeline Dakin M.D.   On: 01/08/2019 11:00   Dg Chest Port 1 View  Result Date: 01/06/2019 CLINICAL DATA:  Fever. EXAM: PORTABLE CHEST 1 VIEW COMPARISON:  Radiograph 06/29/2018. FINDINGS: Cardiomegaly, new from prior exam. Bilateral pleural effusions, also  new. Vascular congestion without overt pulmonary edema. Bibasilar opacities associated with pleural effusions typically atelectasis. No pneumothorax. IMPRESSION: Cardiomegaly with vascular congestion and small pleural effusions. Findings consistent with CHF. Electronically Signed   By: Keith Rake M.D.   On: 01/06/2019 23:26   Ct Renal Stone Study  Result Date: 01/07/2019 CLINICAL DATA:  Pyelonephritis, complicated; Acute resp illness, > 43 years old Cough, persistent. Left flank pain. Patient reports renal biopsy 3 days ago. Fever, shortness of breath and flu-like symptoms. EXAM: CT CHEST, ABDOMEN AND PELVIS WITHOUT CONTRAST TECHNIQUE: Multidetector CT imaging of the chest, abdomen and pelvis was performed following the standard protocol without IV contrast. COMPARISON:  Chest radiograph performed concurrently. Abdominal CT 12/31/2018 FINDINGS: CT CHEST FINDINGS Cardiovascular: Mild multi chamber cardiomegaly. Trace pericardial fluid. Thoracic aorta is normal in caliber. Mediastinum/Nodes: Multiple  prominent mediastinal nodes. Limited assessment for hilar adenopathy given lack of IV contrast. Esophagus is decompressed. No visualized thyroid nodule. Lungs/Pleura: Small to moderate bilateral pleural effusions. Associated compressive atelectasis. There is smooth septal thickening consistent with pulmonary edema. Multifocal patchy ground-glass opacities, asymmetric throughout both lungs. Trachea mainstem bronchi are patent. Musculoskeletal: Chronic mild T10 superior endplate compression fracture. There are no acute or suspicious osseous abnormalities. CT ABDOMEN PELVIS FINDINGS Hepatobiliary: Elongated left lobe of the liver. No focal hepatic lesion on noncontrast exam. Insert: Pancreas: No ductal dilatation or inflammation. Spleen: Splenectomy. Adrenals/Urinary Tract: Normal adrenal glands. High-density left perinephric collection spanning 2.9 cm in depth and 9.8 cm in length most consistent with hematoma in the setting of recent kidney biopsy. Small foci of gas within the collection inferiorly. No evidence of intrarenal fluid collection. No significant mass effect on the left kidney. No hydronephrosis. No right hydronephrosis. Both ureters are decompressed. Urinary bladder is minimally distended. Questionable bladder wall thickening. Stomach/Bowel: Bowel evaluation is limited in the absence of enteric contrast. Ingested material in the stomach. No evidence of bowel wall thickening, inflammatory change, or obstruction. The duodenal jejunal junction is not well-defined on this noncontrast exam. Colonic diverticulosis without diverticulitis. Normal appendix tentatively identified. Vascular/Lymphatic: Normal caliber thoracic aorta. Tab attic left common iliac artery at 19 mm, unchanged allowing for differences in caliper placement. No definite enlarged lymph nodes in the abdomen or pelvis. Reproductive: Uterus and bilateral adnexa are unremarkable. Other: Subcutaneous flank soft tissue edema, left greater than  right. Small left lateral lumbar hernia contains only fat, unchanged. No ascites or hemoperitoneum. Minimal edema in the presacral space. Musculoskeletal: Prominent Schmorl's node loss of height T12, unchanged. Degenerative change in the lumbar spine. 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 IMPRESSION: 1. Cardiomegaly with bilateral pleural effusions. Smooth septal thickening consistent with pulmonary edema. 2. There are patchy bilateral ground-glass opacities in both lungs that are nonspecific in the setting, this may represent alveolar edema infectious or inflammatory etiologies. Imaging features are atypical or on commonly reported with COVID-19 or viral pneumonia. Alternative diagnosis should be considered. 3. Recent left renal biopsy with moderate-sized left extrarenal perinephric hematoma. Small foci of air within the hematoma is likely related to recent biopsy, however raises the possibility of superimposed infection. 4. Colonic diverticulosis without diverticulitis. Electronically Signed   By: Keith Rake M.D.   On: 01/07/2019 00:09     Medications:   . sodium chloride 50 mL/hr at 01/07/19 2041  . sodium chloride     . ALPRAZolam  0.5 mg Oral TID  . amLODipine  10 mg Oral Daily  . ceFEPime (MAXIPIME) IV  500 mg Intravenous Q24H  .  furosemide  40 mg Intravenous Once  . guaiFENesin  1,200 mg Oral BID  . isosorbide dinitrate  10 mg Oral BID  . metoprolol tartrate  25 mg Oral BID  . sertraline  100 mg Oral Daily  . sodium bicarbonate  650 mg Oral BID  . sodium chloride flush  3 mL Intravenous Q12H  . vitamin C  500 mg Oral Daily  . zinc sulfate  220 mg Oral Daily   sodium chloride, albuterol, chlorpheniramine-HYDROcodone, HYDROmorphone (DILAUDID) injection, Ipratropium-Albuterol, oxyCODONE, sodium chloride flush  Assessment/ Plan:   Nonoliguric acute on chronic renal insufficiency baseline serum creatinine 1.5 exposed to Cipro admitted with hematuria and pyuria.   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  Creatinine has increased slightly today 06/22/4627  Metabolic acidosis increase sodium bicarbonate to 3 times daily  Anemia some iron deficiency will start some IV Ferrlicit 638 mg IV day  X 8   Chronic ITP status post splenectomy  Flulike symptoms with groundglass appearance on CT ruling out for COVID-19   LOS: [place] @TODAY @11 :25 AM

## 2019-01-08 NOTE — Progress Notes (Signed)
PROGRESS NOTE    JONI COLEGROVE  QIH:[id number] DOB: 1966-01-02 DOA: 01/06/2019 PCP: Redmond School, MD   Brief Narrative:  The patient is a 53 year old Caucasian female with a past medical history significant for but not limited to pretension, CAD, GERD, ITP status post splenectomy, history of PE in 2013, history of CKD stage III and recent AKI on CKD who underwent a kidney biopsy of 01/03/2019 with subsequent left flank pain since discharge associated with a fever and a cough.  She contacted nephrology and she was told to present to the ED and she reports that she had a fever 103 with a slightly productive cough.  Is unclear if she had yellowish sputum but denies any other complaints except back pain.  In the ED she underwent a chest abdomen pelvis CT scan which showed cardiomegaly and bilateral pleural effusions with smooth septal thickening consistent with pulmonary edema.  There is also patchy bilateral groundglass opacities in both lungs that are nonspecific and could have represented a viral pneumonia or COVID-19 disease. CT of the Abdomen and Pelvis also showed a recent left renal biopsy with a moderate-sized left extrarenal peri-nephric hematoma with a small foci of air within the hematoma raising a ? About superimposed infection.  Admitted for her fever and blood cultures and urine culture were obtained and she was suspected to have COVID-19 disease secondary to her CT of the chest findings  Assessment & Plan:   Principal Problem:   Fever Active Problems:   Diabetes mellitus type 2, uncomplicated (HCC)   CAD (coronary artery disease): total distal RCA EF normal (05/20/17)   CKD (chronic kidney disease), stage V (HCC)   Cough  Fever ? perinephric abscess and Hematoma vs. COVID-19 -Blood culture x2 showed NGTD at 2 Days; Procalcitonin was <10 -Checking COVID Labs: CRP was 5.9, ESR was 61,  Ferritin was 273, CK was 34, LDH was 232, IL-6 pending, D-Dimer was 2.16 -Repeat COVID Labs showed  CK fo 27, Ferritin of 293, CRP of 9.7, ESR of 62, and D-Dimer of 1.87 and IL-6 of 79.0; AST is 12 -Check COVID-19 SARS-CoV-2 -Urinalysis moderate hemoglobin, 100 protein, rare bacteria, hyaline casts present, 6-10 RBCs per high-power field, 0 to 5 g epithelial cells, 11-20 WBCs and Trace Leukocytes -Received Ceftriaxone but then was started on IV Cefepime empirically pharmacy to dose -Influenza A/B via PCR Negative and Respiratory Virus Panel Negative  -Continue to Monitor and Trend D-Dimer (trending down) -Will Consider addition of vanco if urine culture + staph  -Continue to Monitor Temperature Curve and Cultures -Patient was Febrile at 100.8 this AM  -Will consider adding Hydroxychloroquine if worsening   Acute Respiratory Failure with Hypoxia -Continue Supplemental O2 via [place] and Wean O2 as Tolerated -Continuous Pulse Oximetry and Maintain O2 Saturations >92% -As Above  -O2 Requirement increased -Gave IV Lasix 40 mg x1 -Add Combivent 4 times daily and q6hprn -Repeat CXR showed "Developing pneumonia involving the RIGHT upper lobe. Moderate to large BILATERAL pleural effusions, increased in size on the RIGHT and unchanged on the LEFT. Associated dense BILATERAL lower lobe atelectasis and/or pneumonia. Stable mild cardiomegaly without pulmonary edema currently as the pulmonary vascularity is now normal" -Discussed with Nephrology as patient appeared volume overloaded and had minimal urine output today and recommending giving IV Lasix 80 mg and make NPO in anticipation of Starting Dialysis -Would Like to get an ECHO but unfortunately can't be done until COVID Testing is ruled out -Start Guaifenesin 1200 mg po BID and Tussionex 5  mL po q12h  L Flank Painin the setting of hematoma vs perinephric abscess -Hold Aspirin in the setting of Worsening Hb -Monitor Hgb; Patient's Hb/Hct went from 8.0/25.6 -> 8.8/28.6 -> 7.4/23.4 -C/w Dilaudid 0.'5mg'$  iv q4h prn -Nephrology consulted and appreciate  their input   CAD -Continue  Isosorbide '10mg'$  po TID -ASA currently being Held due to Hematoma -C/w Metoprolol '25mg'$  po BID  AKI on CKD stage3, now CKD stage5 Non-Gap Metabolic Acidosis; AG is 15 -BUN/Cr is now gone from 74/6.93 -> 71/6.82 -> 70/7.26 -Continue with Sodium Bicarbonate '650mg'$  po bid -Pateitn was getting IVF with NS at 50 mL/hr and now stopped  -Nephrology Consulted for further evaluation and recommendations and Dr. Justin Mend dilated patient has a baseline serum creatinine of 1.5 and exposure to ciprofloxacin along with hematuria. -Dr. Justin Mend discussed the case with Dr. 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 Royce Macadamia recommends giving a dose of IV Lasix 80 mg x 1 to the middle of the night if she becomes more short of breath or symptomatic -Patient will be made n.p.o. in case a temporary dialysis catheter needs to be placed -Monitor strict I's and O's -C/w Sodium Bicarbnate 650 mg po TID -Patient remains severely volume overloaded and had very minimal urine output throughout the whole day today. -May need Foley Catheter for closer UOP Monitoring -Renal U/S Cannot be done due COVID-19  Hypertension -Continue with Metoprolol 25 mg p.o. twice daily along with Amlodipine 10 mg p.o. daily and Isosorbide 10 mg po TID -Continue to Monitor Blood Pressures Carefully -BP was low at 109/71  Anxiety -Initially Held Zoloft '100mg'$  po qday due to ? Allergy per computer but states it was causing diarrhea at higher doses so resumedd  Normocytic Anemia -Patient's hemoglobin/hematocrit went from 8.0/25.6 is now 8.8/28.6 -> 7.4/23.4 -Checked anemia panel and iron level was 12, U IBC was 237, TIBC was 249, saturation ratios 5%, ferritin level was  273 -Nephrology giving the patient IV Ferric Gluconate 125 mg x8 doses  -Continue monitor for signs due to bleeding -Repeat CBC in the a.m.  Hyperphosphatemia -Patient's Phos Level is now 7.4 -In the setting of Renal Dysfunction -Continue to Monitor and Repeat Phos Level in AM   Obesity -Estimated body mass index is 31.77 kg/m as calculated from the following:   Height as of this encounter: '5\' 5"'$  (1.651 m).   Weight as of this encounter: 86.6 kg. -Weight Loss and Dietary Counseling given   DVT prophylaxis: No Pharmacologic AC due to Hbg Drop Code Status: FULL CODE Family Communication: No family present at bedside  Disposition Plan: Remain Inpatient for further evaluation and Treatment  Consultants:   Nephrology   Procedures: None  Antimicrobials: Anti-infectives (From admission, onward)   Start     Dose/Rate Route Frequency Ordered Stop   01/07/19 1200  ceFEPIme (MAXIPIME) 500 mg in dextrose 5 % 50 mL IVPB     500 mg 100 mL/hr over 30 Minutes Intravenous Every 24 hours 01/07/19 1053     01/07/19 0100  cefTRIAXone (ROCEPHIN) 1 g in sodium chloride 0.9 % 100 mL IVPB     1 g 200 mL/hr over 30 Minutes Intravenous  Once 01/07/19 0054 01/07/19 0258     Subjective: Seen and examined at bedside  and the patient was complaining of shortness of breath and worsening cough all night.  Denies chest tightness but did admit to coughing up significant amount of sputum.  Also was concerned that her hemoglobin dropped and that her kidney function was going up.  Felt puffy.  No other concerns or complaints at this time.  Objective: Vitals:   01/08/19 0500 01/08/19 0730 01/08/19 1117 01/08/19 1559  BP: 122/89 125/88 121/82 109/71  Pulse: 72 72 79 75  Resp: 20 (!) 21 (!) 22 (!) 23  Temp: 98.3 F (36.8 C) 98.6 F (37 C) 98.2 F (36.8 C) 98.6 F (37 C)  TempSrc: Oral Oral Oral Oral  SpO2: 94% 97% 97% 96%  Weight:      Height:        Intake/Output Summary (Last 24 hours) at  01/08/2019 1815 Last data filed at 01/08/2019 1610 Gross per 24 hour  Intake 2008.78 ml  Output 75 ml  Net 1933.78 ml   Filed Weights   01/06/19 2116  Weight: 86.6 kg   Examination: Physical Exam:  Constitutional: WN/WD morbidly obese Caucasian female in NAD and appears calm and comfortable Eyes: Lids and conjunctivae normal, sclerae anicteric  ENMT: External Ears, Nose appear normal. Grossly normal hearing. Mucous membranes are moist.  Neck: Appears normal, supple, no cervical masses, normal ROM, no appreciable thyromegaly; mild JVD Respiratory: Diminished to auscultation bilaterally, no wheezing, rales, rhonchi or crackles. Mildly tachypenic  Cardiovascular: RRR, no murmurs / rubs / gallops. S1 and S2 auscultated. Has 1-2+ LE Edema Abdomen: Soft, non-tender, Distended. No masses palpated. No appreciable hepatosplenomegaly. Bowel sounds positive x4.  GU: Deferred. Musculoskeletal: No clubbing / cyanosis of digits/nails. No joint deformity upper and lower extremities.  Skin: No rashes, lesions, ulcers on a limited skin evaluation. No induration; Warm and dry.  Neurologic: CN 2-12 grossly intact with no focal deficits.  Romberg sign and cerebellar reflexes not assessed.  Psychiatric: Normal judgment and insight. Alert and oriented x 3. Anxious mood and appropriate affect.   Data Reviewed: I have personally reviewed following labs and imaging studies  CBC: Recent Labs  Lab 01/04/19 0607 01/06/19 2156 01/07/19 0827 01/08/19 0631  WBC  --  7.8 8.8 10.0  NEUTROABS  --  4.6 5.9  --   HGB 8.0* 8.0* 8.8* 7.4*  HCT 25.4* 25.6* 28.6* 23.4*  MCV  --  90.5 91.7 90.0  PLT  --  188 190 644   Basic Metabolic Panel: Recent Labs  Lab 01/02/19 0427 01/03/19 0452 01/04/19 0607 01/06/19 2156 01/07/19 0827 01/08/19 0631  NA 142 140 140 134* 136 135  K 4.1 3.6 3.9 4.1 4.1 4.4  CL 115* 113* 113* 105 107 105  CO2 17* 17* 16* 17* 17* 15*  GLUCOSE 86 89 94 126* 91 95  BUN 65* 69* 69* 74*  71* 70*  CREATININE 7.02* 6.95* 6.80* 6.93* 6.82* 7.26*  CALCIUM 7.6* 7.8* 7.7* 7.7* 7.8* 7.7*  MG  --   --   --   --   --  2.0  PHOS 6.9* 6.7* 6.8*  --   --  7.4*   GFR: Estimated Creatinine Clearance: 9.7 mL/min (A) (by C-G formula based on SCr of 7.26 mg/dL (H)). Liver Function Tests: Recent Labs  Lab 01/03/19 0452 01/04/19 0607 01/06/19 2156 01/07/19 0827 01/08/19 0631  AST  --   --  17 16 12*  ALT  --   --  '12 11 10  '$ ALKPHOS  --   --  57  60 49  BILITOT  --   --  0.7 0.7 0.7  PROT  --   --  6.3* 6.7 5.2*  ALBUMIN 2.7* 2.7* 3.2* 3.2* 2.4*  2.4*   No results for input(s): LIPASE, AMYLASE in the last 168 hours. No results for input(s): AMMONIA in the last 168 hours. Coagulation Profile: No results for input(s): INR, PROTIME in the last 168 hours. Cardiac Enzymes: Recent Labs  Lab 01/07/19 0827 01/07/19 1119 01/07/19 1835 01/07/19 2255 01/08/19 0631  CKTOTAL  --  34*  --   --  27*  TROPONINI 0.09* 0.07* 0.07* 0.06*  --    BNP (last 3 results) No results for input(s): PROBNP in the last 8760 hours. HbA1C: No results for input(s): HGBA1C in the last 72 hours. CBG: Recent Labs  Lab 01/03/19 1201  GLUCAP 71   Lipid Profile: No results for input(s): CHOL, HDL, LDLCALC, TRIG, CHOLHDL, LDLDIRECT in the last 72 hours. Thyroid Function Tests: No results for input(s): TSH, T4TOTAL, FREET4, T3FREE, THYROIDAB in the last 72 hours. Anemia Panel: Recent Labs    01/07/19 0827 01/07/19 1119 01/08/19 0631  FERRITIN 273  --  293  TIBC  --  249*  --   IRON  --  12*  --    Sepsis Labs: Recent Labs  Lab 01/06/19 2156 01/07/19 0827  PROCALCITON  --  <0.10  LATICACIDVEN 0.8  --     Recent Results (from the past 240 hour(s))  Culture, Urine     Status: None   Collection Time: 12/31/18  2:56 AM  Result Value Ref Range Status   Specimen Description   Final    URINE, CLEAN CATCH Performed at [facility], [address], [place], [place] [address]    Special  Requests   Final    NONE Performed at [facility], [address], [place], [place] [address]    Culture   Final    NO GROWTH Performed at [facility], [place] [address], [place], [place] [address]    Report Status 01/01/2019 FINAL  Final  Blood Culture (routine x 2)     Status: None (Preliminary result)   Collection Time: 01/06/19  9:56 PM  Result Value Ref Range Status   Specimen Description RIGHT ANTECUBITAL  Final   Special Requests   Final    BOTTLES DRAWN AEROBIC AND ANAEROBIC Blood Culture adequate volume   Culture   Final    NO GROWTH 2 DAYS Performed at [facility], [address], [place], [place] [address]    Report Status PENDING  Incomplete  Blood Culture (routine x 2)     Status: None (Preliminary result)   Collection Time: 01/06/19 10:00 PM  Result Value Ref Range Status   Specimen Description LEFT ANTECUBITAL  Final   Special Requests   Final    BOTTLES DRAWN AEROBIC AND ANAEROBIC Blood Culture adequate volume   Culture   Final    NO GROWTH 2 DAYS Performed at [facility], [address], [place], [place] [address]    Report Status PENDING  Incomplete  Respiratory Panel by PCR     Status: None   Collection Time: 01/07/19  7:20 AM  Result Value Ref Range Status   Adenovirus NOT DETECTED NOT DETECTED Final   Coronavirus 229E NOT DETECTED NOT DETECTED Final    Comment: (NOTE) The Coronavirus on the Respiratory Panel, DOES NOT test for the novel  Coronavirus (2019 nCoV)    Coronavirus HKU1 NOT DETECTED  NOT DETECTED Final   Coronavirus NL63 NOT DETECTED NOT DETECTED Final   Coronavirus OC43 NOT DETECTED NOT DETECTED Final   Metapneumovirus NOT DETECTED NOT DETECTED Final   Rhinovirus / Enterovirus NOT DETECTED NOT DETECTED Final   Influenza A NOT DETECTED NOT DETECTED Final   Influenza B NOT DETECTED NOT DETECTED Final   Parainfluenza Virus 1 NOT DETECTED NOT DETECTED Final   Parainfluenza Virus 2 NOT DETECTED NOT DETECTED Final   Parainfluenza Virus 3  NOT DETECTED NOT DETECTED Final   Parainfluenza Virus 4 NOT DETECTED NOT DETECTED Final   Respiratory Syncytial Virus NOT DETECTED NOT DETECTED Final   Bordetella pertussis NOT DETECTED NOT DETECTED Final   Chlamydophila pneumoniae NOT DETECTED NOT DETECTED Final   Mycoplasma pneumoniae NOT DETECTED NOT DETECTED Final    Comment: Performed at [facility], [place] [address] [place], [place] [address]    Radiology Studies: Ct Chest Wo Contrast  Result Date: 01/07/2019 CLINICAL DATA:  Pyelonephritis, complicated; Acute resp illness, > 89 years old Cough, persistent. Left flank pain. Patient reports renal biopsy 3 days ago. Fever, shortness of breath and flu-like symptoms. EXAM: CT CHEST, ABDOMEN AND PELVIS WITHOUT CONTRAST TECHNIQUE: Multidetector CT imaging of the chest, abdomen and pelvis was performed following the standard protocol without IV contrast. COMPARISON:  Chest radiograph performed concurrently. Abdominal CT 12/31/2018 FINDINGS: CT CHEST FINDINGS Cardiovascular: Mild multi chamber cardiomegaly. Trace pericardial fluid. Thoracic aorta is normal in caliber. Mediastinum/Nodes: Multiple prominent mediastinal nodes. Limited assessment for hilar adenopathy given lack of IV contrast. Esophagus is decompressed. No visualized thyroid nodule. Lungs/Pleura: Small to moderate bilateral pleural effusions. Associated compressive atelectasis. There is smooth septal thickening consistent with pulmonary edema. Multifocal patchy ground-glass opacities, asymmetric throughout both lungs. Trachea mainstem bronchi are patent. Musculoskeletal: Chronic mild T10 superior endplate compression fracture. There are no acute or suspicious osseous abnormalities. CT ABDOMEN PELVIS FINDINGS Hepatobiliary: Elongated left lobe of the liver. No focal hepatic lesion on noncontrast exam. Insert: Pancreas: No ductal dilatation or inflammation. Spleen: Splenectomy. Adrenals/Urinary Tract: Normal adrenal glands. High-density left  perinephric collection spanning 2.9 cm in depth and 9.8 cm in length most consistent with hematoma in the setting of recent kidney biopsy. Small foci of gas within the collection inferiorly. No evidence of intrarenal fluid collection. No significant mass effect on the left kidney. No hydronephrosis. No right hydronephrosis. Both ureters are decompressed. Urinary bladder is minimally distended. Questionable bladder wall thickening. Stomach/Bowel: Bowel evaluation is limited in the absence of enteric contrast. Ingested material in the stomach. No evidence of bowel wall thickening, inflammatory change, or obstruction. The duodenal jejunal junction is not well-defined on this noncontrast exam. Colonic diverticulosis without diverticulitis. Normal appendix tentatively identified. Vascular/Lymphatic: Normal caliber thoracic aorta. Tab attic left common iliac artery at 19 mm, unchanged allowing for differences in caliper placement. No definite enlarged lymph nodes in the abdomen or pelvis. Reproductive: Uterus and bilateral adnexa are unremarkable. Other: Subcutaneous flank soft tissue edema, left greater than right. Small left lateral lumbar hernia contains only fat, unchanged. No ascites or hemoperitoneum. Minimal edema in the presacral space. Musculoskeletal: Prominent Schmorl's node loss of height T12, unchanged. Degenerative change in the lumbar spine. 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 IMPRESSION: 1. Cardiomegaly with bilateral pleural effusions. Smooth septal thickening consistent with pulmonary edema. 2. There are patchy bilateral ground-glass opacities in both lungs that are nonspecific in the setting, this may represent alveolar edema infectious or inflammatory etiologies. Imaging features are atypical or on commonly reported  with COVID-19 or viral pneumonia. Alternative diagnosis should be considered. 3. Recent left renal biopsy with moderate-sized left extrarenal perinephric hematoma.  Small foci of air within the hematoma is likely related to recent biopsy, however raises the possibility of superimposed infection. 4. Colonic diverticulosis without diverticulitis. Electronically Signed   By: Keith Rake M.D.   On: 01/07/2019 00:09   Dg Chest Port 1 View  Result Date: 01/08/2019 CLINICAL DATA:  Cough, chest congestion and shortness of breath. Follow-up CHF versus pneumonia. EXAM: PORTABLE CHEST 1 VIEW COMPARISON:  CT chest and chest x-ray 01/06/2019 and earlier. FINDINGS: Cardiac silhouette mildly enlarged, unchanged. Pulmonary vascularity now normal. Developing patchy airspace opacities in the RIGHT UPPER LOBE. Moderate to large BILATERAL pleural effusions, increased in size on the RIGHT and unchanged on the LEFT, with associated dense consolidation in the lower lobes. IMPRESSION: 1. Developing pneumonia involving the RIGHT upper lobe. 2. Moderate to large BILATERAL pleural effusions, increased in size on the RIGHT and unchanged on the LEFT. Associated dense BILATERAL lower lobe atelectasis and/or pneumonia. 3. Stable mild cardiomegaly without pulmonary edema currently as the pulmonary vascularity is now normal. Electronically Signed   By: Evangeline Dakin M.D.   On: 01/08/2019 11:00   Dg Chest Port 1 View  Result Date: 01/06/2019 CLINICAL DATA:  Fever. EXAM: PORTABLE CHEST 1 VIEW COMPARISON:  Radiograph 06/29/2018. FINDINGS: Cardiomegaly, new from prior exam. Bilateral pleural effusions, also new. Vascular congestion without overt pulmonary edema. Bibasilar opacities associated with pleural effusions typically atelectasis. No pneumothorax. IMPRESSION: Cardiomegaly with vascular congestion and small pleural effusions. Findings consistent with CHF. Electronically Signed   By: Keith Rake M.D.   On: 01/06/2019 23:26   Ct Renal Stone Study  Result Date: 01/07/2019 CLINICAL DATA:  Pyelonephritis, complicated; Acute resp illness, > 76 years old Cough, persistent. Left flank pain.  Patient reports renal biopsy 3 days ago. Fever, shortness of breath and flu-like symptoms. EXAM: CT CHEST, ABDOMEN AND PELVIS WITHOUT CONTRAST TECHNIQUE: Multidetector CT imaging of the chest, abdomen and pelvis was performed following the standard protocol without IV contrast. COMPARISON:  Chest radiograph performed concurrently. Abdominal CT 12/31/2018 FINDINGS: CT CHEST FINDINGS Cardiovascular: Mild multi chamber cardiomegaly. Trace pericardial fluid. Thoracic aorta is normal in caliber. Mediastinum/Nodes: Multiple prominent mediastinal nodes. Limited assessment for hilar adenopathy given lack of IV contrast. Esophagus is decompressed. No visualized thyroid nodule. Lungs/Pleura: Small to moderate bilateral pleural effusions. Associated compressive atelectasis. There is smooth septal thickening consistent with pulmonary edema. Multifocal patchy ground-glass opacities, asymmetric throughout both lungs. Trachea mainstem bronchi are patent. Musculoskeletal: Chronic mild T10 superior endplate compression fracture. There are no acute or suspicious osseous abnormalities. CT ABDOMEN PELVIS FINDINGS Hepatobiliary: Elongated left lobe of the liver. No focal hepatic lesion on noncontrast exam. Insert: Pancreas: No ductal dilatation or inflammation. Spleen: Splenectomy. Adrenals/Urinary Tract: Normal adrenal glands. High-density left perinephric collection spanning 2.9 cm in depth and 9.8 cm in length most consistent with hematoma in the setting of recent kidney biopsy. Small foci of gas within the collection inferiorly. No evidence of intrarenal fluid collection. No significant mass effect on the left kidney. No hydronephrosis. No right hydronephrosis. Both ureters are decompressed. Urinary bladder is minimally distended. Questionable bladder wall thickening. Stomach/Bowel: Bowel evaluation is limited in the absence of enteric contrast. Ingested material in the stomach. No evidence of bowel wall thickening, inflammatory  change, or obstruction. The duodenal jejunal junction is not well-defined on this noncontrast exam. Colonic diverticulosis without diverticulitis. Normal appendix tentatively identified. Vascular/Lymphatic: Normal caliber thoracic  aorta. Tab attic left common iliac artery at 19 mm, unchanged allowing for differences in caliper placement. No definite enlarged lymph nodes in the abdomen or pelvis. Reproductive: Uterus and bilateral adnexa are unremarkable. Other: Subcutaneous flank soft tissue edema, left greater than right. Small left lateral lumbar hernia contains only fat, unchanged. No ascites or hemoperitoneum. Minimal edema in the presacral space. Musculoskeletal: Prominent Schmorl's node loss of height T12, unchanged. Degenerative change in the lumbar spine. 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 IMPRESSION: 1. Cardiomegaly with bilateral pleural effusions. Smooth septal thickening consistent with pulmonary edema. 2. There are patchy bilateral ground-glass opacities in both lungs that are nonspecific in the setting, this may represent alveolar edema infectious or inflammatory etiologies. Imaging features are atypical or on commonly reported with COVID-19 or viral pneumonia. Alternative diagnosis should be considered. 3. Recent left renal biopsy with moderate-sized left extrarenal perinephric hematoma. Small foci of air within the hematoma is likely related to recent biopsy, however raises the possibility of superimposed infection. 4. Colonic diverticulosis without diverticulitis. Electronically Signed   By: Keith Rake M.D.   On: 01/07/2019 00:09   Scheduled Meds: . ALPRAZolam  0.5 mg Oral TID  . amLODipine  10 mg Oral Daily  . ceFEPime (MAXIPIME) IV  500 mg Intravenous Q24H  . guaiFENesin  1,200 mg Oral BID  . isosorbide dinitrate  10 mg Oral BID  . metoprolol tartrate  25 mg Oral BID  . sertraline  100 mg Oral Daily  . sodium bicarbonate  650 mg Oral TID  . sodium chloride  flush  3 mL Intravenous Q12H  . vitamin C  500 mg Oral Daily  . zinc sulfate  220 mg Oral Daily   Continuous Infusions: . sodium chloride    . ferric gluconate (FERRLECIT/NULECIT) IV 125 mg (01/08/19 1430)    LOS: 1 day   Kerney Elbe, DO [facility] PAGER is on [place]  If 7PM-7AM, please contact night-coverage [email or web address] Password [facility] 01/08/2019, 6:15 PM

## 2019-01-09 ENCOUNTER — Inpatient Hospital Stay (HOSPITAL_COMMUNITY): Payer: Medicare Other

## 2019-01-09 DIAGNOSIS — N179 Acute kidney failure, unspecified: Secondary | ICD-10-CM

## 2019-01-09 DIAGNOSIS — R0600 Dyspnea, unspecified: Secondary | ICD-10-CM

## 2019-01-09 LAB — RENAL FUNCTION PANEL
Albumin: 2.4 g/dL — ABNORMAL LOW (ref 3.5–5.0)
Anion gap: 13 (ref 5–15)
BUN: 72 mg/dL — ABNORMAL HIGH (ref 6–20)
CO2: 15 mmol/L — ABNORMAL LOW (ref 22–32)
Calcium: 8 mg/dL — ABNORMAL LOW (ref 8.9–10.3)
Chloride: 106 mmol/L (ref 98–111)
Creatinine, Ser: 7.5 mg/dL — ABNORMAL HIGH (ref 0.44–1.00)
GFR calc Af Amer: 7 mL/min — ABNORMAL LOW (ref >60)
GFR calc non Af Amer: 6 mL/min — ABNORMAL LOW (ref >60)
Glucose, Bld: 88 mg/dL (ref 70–99)
Phosphorus: 8.2 mg/dL — ABNORMAL HIGH (ref 2.5–4.6)
Potassium: 4.8 mmol/L (ref 3.5–5.1)
Sodium: 134 mmol/L — ABNORMAL LOW (ref 135–145)

## 2019-01-09 LAB — PREPARE RBC (CROSSMATCH)

## 2019-01-09 LAB — CBC WITH DIFFERENTIAL/PLATELET
Abs Immature Granulocytes: 0.04 10*3/uL (ref 0.00–0.07)
Basophils Absolute: 0.1 10*3/uL (ref 0.0–0.1)
Basophils Relative: 1 %
Eosinophils Absolute: 0.4 10*3/uL (ref 0.0–0.5)
Eosinophils Relative: 4 %
HCT: 22.5 % — ABNORMAL LOW (ref 36.0–46.0)
Hemoglobin: 6.9 g/dL — CL (ref 12.0–15.0)
Immature Granulocytes: 0 %
Lymphocytes Relative: 23 %
Lymphs Abs: 2.2 10*3/uL (ref 0.7–4.0)
MCH: 27.5 pg (ref 26.0–34.0)
MCHC: 30.7 g/dL (ref 30.0–36.0)
MCV: 89.6 fL (ref 80.0–100.0)
Monocytes Absolute: 1 10*3/uL (ref 0.1–1.0)
Monocytes Relative: 10 %
Neutro Abs: 5.9 10*3/uL (ref 1.7–7.7)
Neutrophils Relative %: 62 %
Platelets: 149 10*3/uL — ABNORMAL LOW (ref 150–400)
RBC: 2.51 MIL/uL — ABNORMAL LOW (ref 3.87–5.11)
RDW: 15.6 % — ABNORMAL HIGH (ref 11.5–15.5)
WBC: 9.6 10*3/uL (ref 4.0–10.5)
nRBC: 0 % (ref 0.0–0.2)

## 2019-01-09 LAB — COMPREHENSIVE METABOLIC PANEL
ALT: 7 U/L (ref 0–44)
AST: 11 U/L — ABNORMAL LOW (ref 15–41)
Albumin: 2.4 g/dL — ABNORMAL LOW (ref 3.5–5.0)
Alkaline Phosphatase: 49 U/L (ref 38–126)
Anion gap: 12 (ref 5–15)
BUN: 73 mg/dL — ABNORMAL HIGH (ref 6–20)
CO2: 17 mmol/L — ABNORMAL LOW (ref 22–32)
Calcium: 8 mg/dL — ABNORMAL LOW (ref 8.9–10.3)
Chloride: 105 mmol/L (ref 98–111)
Creatinine, Ser: 7.76 mg/dL — ABNORMAL HIGH (ref 0.44–1.00)
GFR calc Af Amer: 6 mL/min — ABNORMAL LOW (ref >60)
GFR calc non Af Amer: 5 mL/min — ABNORMAL LOW (ref >60)
Glucose, Bld: 89 mg/dL (ref 70–99)
Potassium: 4.8 mmol/L (ref 3.5–5.1)
Sodium: 134 mmol/L — ABNORMAL LOW (ref 135–145)
Total Bilirubin: 0.8 mg/dL (ref 0.3–1.2)
Total Protein: 5.1 g/dL — ABNORMAL LOW (ref 6.5–8.1)

## 2019-01-09 LAB — CK: Total CK: 33 U/L — ABNORMAL LOW (ref 38–234)

## 2019-01-09 LAB — NOVEL CORONAVIRUS, NAA (HOSP ORDER, SEND-OUT TO REF LAB; TAT 18-24 HRS): SARS-CoV-2, NAA: NOT DETECTED

## 2019-01-09 LAB — MAGNESIUM: Magnesium: 2 mg/dL (ref 1.7–2.4)

## 2019-01-09 LAB — SEDIMENTATION RATE: Sed Rate: 66 mm/hr — ABNORMAL HIGH (ref 0–22)

## 2019-01-09 LAB — FERRITIN: Ferritin: 395 ng/mL — ABNORMAL HIGH (ref 11–307)

## 2019-01-09 LAB — ABO/RH: ABO/RH(D): A POS

## 2019-01-09 LAB — D-DIMER, QUANTITATIVE: D-Dimer, Quant: 1.83 ug/mL-FEU — ABNORMAL HIGH (ref 0.00–0.50)

## 2019-01-09 LAB — PHOSPHORUS: Phosphorus: 8.2 mg/dL — ABNORMAL HIGH (ref 2.5–4.6)

## 2019-01-09 LAB — C-REACTIVE PROTEIN: CRP: 13.4 mg/dL — ABNORMAL HIGH (ref <1.0)

## 2019-01-09 MED ORDER — SODIUM CHLORIDE 0.9% IV SOLUTION
Freq: Once | INTRAVENOUS | Status: AC
  Administered 2019-01-09: 12:00:00 via INTRAVENOUS

## 2019-01-09 MED ORDER — HYDROXYCHLOROQUINE SULFATE 200 MG PO TABS
400.0000 mg | ORAL_TABLET | Freq: Two times a day (BID) | ORAL | Status: DC
  Administered 2019-01-09: 400 mg via ORAL
  Filled 2019-01-09: qty 2

## 2019-01-09 MED ORDER — FUROSEMIDE 10 MG/ML IJ SOLN
80.0000 mg | Freq: Once | INTRAMUSCULAR | Status: AC
  Administered 2019-01-09: 80 mg via INTRAVENOUS
  Filled 2019-01-09: qty 8

## 2019-01-09 MED ORDER — CALCIUM ACETATE (PHOS BINDER) 667 MG PO CAPS
1334.0000 mg | ORAL_CAPSULE | Freq: Three times a day (TID) | ORAL | Status: DC
  Administered 2019-01-09 – 2019-01-13 (×5): 1334 mg via ORAL
  Filled 2019-01-09 (×5): qty 2

## 2019-01-09 MED ORDER — FUROSEMIDE 10 MG/ML IJ SOLN
80.0000 mg | Freq: Once | INTRAMUSCULAR | Status: AC
  Administered 2019-01-09: 40 mg via INTRAVENOUS
  Filled 2019-01-09: qty 8

## 2019-01-09 MED ORDER — HYDROXYCHLOROQUINE SULFATE 200 MG PO TABS: 200.0000 mg | ORAL_TABLET | Freq: Two times a day (BID) | ORAL | Status: DC

## 2019-01-09 MED ORDER — FUROSEMIDE 10 MG/ML IJ SOLN
40.0000 mg | Freq: Once | INTRAMUSCULAR | Status: DC
  Administered 2019-01-09: 40 mg via INTRAVENOUS
  Filled 2019-01-09: qty 4

## 2019-01-09 MED ORDER — METOLAZONE 5 MG PO TABS
10.0000 mg | ORAL_TABLET | Freq: Once | ORAL | Status: AC
  Administered 2019-01-09: 10 mg via ORAL
  Filled 2019-01-09: qty 2

## 2019-01-09 MED ORDER — SODIUM CHLORIDE 0.9 % IV SOLN
1.0000 g | INTRAVENOUS | Status: AC
  Administered 2019-01-10 – 2019-01-13 (×4): 1 g via INTRAVENOUS
  Filled 2019-01-09 (×6): qty 1

## 2019-01-09 NOTE — Progress Notes (Addendum)
Patient ID: Summer Cook, female   DOB: 10-11-1965, 53 y.o.   MRN: [id number] [facility] Progress Note   Addendum:  Labs reviewed from earlier-renal function stable versus possibly better. I received a phone call from Dr. Alfredia Ferguson regarding the patient developing increasing shortness of breath (possibly volume mediated) after 1 unit of PRBC transfusion.  I recommended intravenous furosemide 80 mg x 1 dose to help augment diuresis.  Will review response to this and request for IR to evaluate her and placed on schedule for dialysis catheter placement tomorrow.  If she has significant improvement of renal function overnight-this procedure can be canceled. Elmarie Shiley MD [facility] Office # [phone] Pager # [phone] 4:37 PM   Assessment/ Plan:   1. Acute kidney Injury: Nonoliguric and labs pending from this morning.  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  She does not have any uremic symptoms/significant volume overload and I will await labs from this morning to decide on need for dialysis. 2.  Acute hypoxic respiratory failure: Being evaluated to rule out COVID-19 infection and on empiric broad-spectrum antimicrobial therapy. 3.  Left extrarenal perinephric hematoma: Status post renal biopsy; hemodynamically stable with downtrending hemoglobin/hematocrit. 4.  Hyperphosphatemia: Secondary to advancing chronic kidney disease and acute kidney injury.  Begin calcium acetate for phosphorus binding 3 times daily AC. 5.  Gap/non-gap metabolic acidosis: Secondary to acute kidney injury/advancing chronic kidney disease.  On sodium bicarbonate supplement.  Subjective:   Reports to be feeling better today except for intermittent cough   Objective:   BP 116/72   Pulse 83   Temp 99.4 F (37.4 C) (Oral)   Resp (!) 23   Ht 5\' 5"  (1.651 m)   Wt 86.6 kg   LMP 08/13/2011   SpO2 95%   BMI 31.77 kg/m   Intake/Output  Summary (Last 24 hours) at 01/09/2019 0800 Last data filed at 01/09/2019 0600 Gross per 24 hour  Intake 1885.78 ml  Output 275 ml  Net 1610.78 ml   Weight change:   Physical Exam: Gen: Appears to be comfortable resting in bed CVS: Regular rhythm, normal rate, S1 and S2 normal Resp: Coarse breath sounds bilaterally, no distinct rales or rhonchi Abd: Soft, flat, nontender Ext: Without lower extremity edema  Imaging: Dg Chest Port 1 View  Result Date: 01/08/2019 CLINICAL DATA:  Cough, chest congestion and shortness of breath. Follow-up CHF versus pneumonia. EXAM: PORTABLE CHEST 1 VIEW COMPARISON:  CT chest and chest x-ray 01/06/2019 and earlier. FINDINGS: Cardiac silhouette mildly enlarged, unchanged. Pulmonary vascularity now normal. Developing patchy airspace opacities in the RIGHT UPPER LOBE. Moderate to large BILATERAL pleural effusions, increased in size on the RIGHT and unchanged on the LEFT, with associated dense consolidation in the lower lobes. IMPRESSION: 1. Developing pneumonia involving the RIGHT upper lobe. 2. Moderate to large BILATERAL pleural effusions, increased in size on the RIGHT and unchanged on the LEFT. Associated dense BILATERAL lower lobe atelectasis and/or pneumonia. 3. Stable mild cardiomegaly without pulmonary edema currently as the pulmonary vascularity is now normal. Electronically Signed   By: Evangeline Dakin M.D.   On: 01/08/2019 11:00    Labs: BMET Recent Labs  Lab 01/03/19 0452 01/04/19 0607 01/06/19 2156 01/07/19 0827 01/08/19 0631  NA 140 140 134* 136 135  K 3.6 3.9 4.1 4.1 4.4  CL 113* 113* 105 107 105  CO2 17* 16* 17* 17* 15*  GLUCOSE 89 94 126* 91 95  BUN 69* 69* 74* 71* 70*  CREATININE 6.95* 6.80* 6.93* 6.82* 7.26*  CALCIUM 7.8* 7.7* 7.7* 7.8* 7.7*  PHOS 6.7* 6.8*  --   --  7.4*   CBC Recent Labs  Lab 01/04/19 0607 01/06/19 2156 01/07/19 0827 01/08/19 0631  WBC  --  7.8 8.8 10.0  NEUTROABS  --  4.6 5.9  --   HGB 8.0* 8.0* 8.8* 7.4*   HCT 25.4* 25.6* 28.6* 23.4*  MCV  --  90.5 91.7 90.0  PLT  --  188 190 168    Medications:    . ALPRAZolam  0.5 mg Oral TID  . amLODipine  10 mg Oral Daily  . ceFEPime (MAXIPIME) IV  500 mg Intravenous Q24H  . guaiFENesin  1,200 mg Oral BID  . Ipratropium-Albuterol  1 puff Inhalation QID  . isosorbide dinitrate  10 mg Oral BID  . metoprolol tartrate  25 mg Oral BID  . sertraline  100 mg Oral Daily  . sodium bicarbonate  650 mg Oral TID  . sodium chloride flush  3 mL Intravenous Q12H  . vitamin C  500 mg Oral Daily  . zinc sulfate  220 mg Oral Daily   Elmarie Shiley, MD 01/09/2019, 8:00 AM

## 2019-01-09 NOTE — Progress Notes (Addendum)
Note order for tunneled HD catheter placement.  Patient is NPO. Not currently on blood thinners.  Covid negative 4/11.  For possible procedure tomorrow (4/14).  Brynda Greathouse, MS RD PA-C

## 2019-01-09 NOTE — Consult Note (Signed)
NAME:  Summer Cook, MRN:  [id number], DOB:  05/12/1966, LOS: 2 ADMISSION DATE:  01/06/2019, CONSULTATION DATE:  01/09/2019 REFERRING MD:  Alfredia Ferguson, CHIEF COMPLAINT:  Fever and dyspnea.   HPI/course in hospital  53 year old woman with worsening CKD.  Presented with increasing shortness of breath and flank pain with fever following renal biopsy 4/7. Consulted for increasing shortness of breath. COVID-19 testing negative.  Past Medical History   Past Medical History:  Diagnosis Date  . Allergy   . Anxiety   . Asthma   . CAD (coronary artery disease), native coronary artery    a. cath 05/20/17 - 100% distal PDA --> medical mangement (no intervention done)  . Candida esophagitis ([place])   . Chest pain at rest 04/2017  . Depression   . Essential hypertension, benign   . GERD (gastroesophageal reflux disease)   . Gout   . Hemorrhoids   . 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  Last was Prednisone '40mg'$ ; 2 wk in 10/2012.  She also was on Prednisone bridged with Cellcept briefly but stopped due to lack of insurance.   . Myocardial infarction ([place])   . Pulmonary embolism ([place]) 04/2012  . Renal insufficiency   . Serrated adenoma of colon   . Steroid-induced diabetes ([place])   . Stroke ([place])   . Thrush, oral 05/29/2011     Past Surgical History:  Procedure Laterality Date  . BONE MARROW BIOPSY    . CARDIAC CATHETERIZATION    . CARPAL TUNNEL RELEASE    . CARPAL TUNNEL RELEASE Left 05/20/2016   Procedure: CARPAL TUNNEL RELEASE;  Surgeon: Carole Civil, MD;  Location: [facility];  Service: Orthopedics;  Laterality: Left;  . CARPAL TUNNEL RELEASE Right 07/16/2016   Procedure: RIGHT CARPAL TUNNEL RELEASE;  Surgeon: Carole Civil, MD;  Location: [facility];  Service: Orthopedics;  Laterality: Right;  . CATARACT EXTRACTION    . CHOLECYSTECTOMY  2008  . COLONOSCOPY WITH  ESOPHAGOGASTRODUODENOSCOPY (EGD) N/A 01/04/2013   Dr. Gala Romney: esophageal plaques with +KOH, hh. Gastric antrum abnormal, bx reactive gastropathy. Anal canal hemorrhoids, colonic tics, normal TI, single polyp (sessile serrated tubular adenoma). Next TCS 12/2017  . LEFT HEART CATH AND CORONARY ANGIOGRAPHY N/A 05/20/2017   Procedure: LEFT HEART CATH AND CORONARY ANGIOGRAPHY;  Surgeon: Martinique, Peter M, MD;  Location: [facility];  Service: Cardiovascular;  Laterality: N/A;  . 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 TEE WITHOUT CARDIOVERSION N/A 01/03/2016   Procedure: TRANSESOPHAGEAL ECHOCARDIOGRAM (TEE) WITH PROPOFOL;  Surgeon: Satira Sark, MD;  Location: [facility];  Service: Cardiovascular;  Laterality: N/A;     Review of Systems:   Review of Systems  Constitutional: Positive for fever.  HENT: Negative.   Eyes: Negative.   Respiratory: Positive for cough and shortness of breath.   Cardiovascular: Negative.   Genitourinary: Positive for flank pain.  Skin: Negative.   Neurological: Negative.     Social History   reports that she quit smoking about 9 years ago. Her smoking use included cigarettes. She quit after 0.00 years of use. She has never used smokeless tobacco. She reports that she does not drink alcohol or use drugs.   Family History   Her family history includes AAA (abdominal aortic aneurysm) in her paternal grandmother; Barrett's esophagus in her paternal aunt; Breast cancer in her paternal grandmother; Cervical cancer in her mother; Colon cancer (age of onset: 12) in her  paternal grandfather; Colon cancer (age of onset: 48) in her paternal grandmother; Colon polyps in her father; Lung cancer in her father; Pneumonia in her brother. There is no history of Stomach cancer or Pancreatic cancer.   Allergies Allergies  Allergen Reactions  . Brintellix [Vortioxetine] Itching  . Cymbalta [Duloxetine Hcl] Itching  . Yellow Jacket Venom Swelling    Swelling at injection site  .  Adhesive [Tape] Rash    Please use paper tape  . Penicillins Itching    Did it involve swelling of the face/tongue/throat, SOB, or low BP? No Did it involve sudden or severe rash/hives, skin peeling, or any reaction on the inside of your mouth or nose? No Did you need to seek medical attention at a hospital or doctor's office? No When did it last happen?53 yrs old If all above answers are "NO", may proceed with cephalosporin use.     Home Medications  Prior to Admission medications   Medication Sig Start Date End Date Taking? Authorizing Provider  acetaminophen (TYLENOL) 500 MG tablet Take 1,000-1,500 mg by mouth every 6 (six) hours as needed for headache (pain).   Yes [provider]  albuterol (PROVENTIL HFA;VENTOLIN HFA) 108 (90 Base) MCG/ACT inhaler Inhale 2 puffs into the lungs every 6 (six) hours as needed for wheezing or shortness of breath.   Yes [provider]  amLODipine (NORVASC) 10 MG tablet Take 1 tablet (10 mg total) by mouth daily for 30 days. 01/05/19 02/04/19 Yes Shah, Pratik D, DO  aspirin EC 81 MG EC tablet Take 1 tablet (81 mg total) by mouth daily. 05/23/17  Yes Eileen Stanford, PA-C  diclofenac sodium (VOLTAREN) 1 % GEL Apply 2 g topically 4 (four) times daily. Patient taking differently: Apply 2 g topically 4 (four) times daily as needed (pain).  06/06/17  Yes Rizwan, Eunice Blase, MD  ipratropium-albuterol (DUONEB) 0.5-2.5 (3) MG/3ML SOLN Take 3 mLs by nebulization 3 (three) times daily as needed (shorntess of breath/wheezing). Patient taking differently: Take 3 mLs by nebulization 3 (three) times daily as needed (shortness of breath/wheezing).  02/18/15  Yes Kathie Dike, MD  isosorbide dinitrate (ISORDIL) 10 MG tablet Take 1 tablet (10 mg total) by mouth 2 (two) times daily for 30 days. 01/04/19 02/03/19 Yes Shah, Pratik D, DO  metoprolol tartrate (LOPRESSOR) 25 MG tablet Take 1 tablet (25 mg total) by mouth 2 (two) times daily for 30 days. 01/04/19 02/03/19  Yes Shah, Pratik D, DO  nitroGLYCERIN (NITROSTAT) 0.4 MG SL tablet Place 1 tablet (0.4 mg total) under the tongue every 5 (five) minutes x 3 doses as needed for chest pain. 05/25/17  Yes Carlota Raspberry, Tiffany, PA-C  sertraline (ZOLOFT) 100 MG tablet Take 100 mg by mouth daily.  10/11/18  Yes [provider]  sodium bicarbonate 650 MG tablet Take 1 tablet (650 mg total) by mouth 2 (two) times daily for 30 days. 01/04/19 02/03/19 Yes Shah, Pratik D, DO  venlafaxine (EFFEXOR) 37.5 MG tablet Take 37.5 mg by mouth 2 (two) times daily. 12/22/18  Yes [provider]  prochlorperazine (COMPAZINE) 10 MG tablet Take 10 mg by mouth as needed. For nausea   12/18/11  [provider]     Interim history/subjective:  Reports mild dyspnea. Ongoing pleuritic flank pain.  Objective   Blood pressure 131/90, pulse 81, temperature 98.4 F (36.9 C), temperature source Oral, resp. rate (!) 24, height '5\' 5"'$  (1.651 m), weight 86.6 kg, last menstrual period 08/13/2011, SpO2 97 %.  Intake/Output Summary (Last 24 hours) at 01/09/2019 1716 Last data filed at 01/09/2019 1430 Gross per 24 hour  Intake 490 ml  Output 350 ml  Net 140 ml   Filed Weights   01/06/19 2116  Weight: 86.6 kg    Examination: Physical Exam Constitutional:      General: She is not in acute distress.    Appearance: Normal appearance. She is not toxic-appearing.  HENT:     Head: Normocephalic and atraumatic.     Mouth/Throat:     Mouth: Mucous membranes are dry.  Eyes:     Extraocular Movements: Extraocular movements intact.     Comments: Periorbital edema  Cardiovascular:     Rate and Rhythm: Tachycardia present. Rhythm irregular.  Pulmonary:     Effort: Respiratory distress (Mild) present.     Breath sounds: Rales present.  Abdominal:     Tenderness: There is left CVA tenderness.     Comments: Left ballotable kidney  Musculoskeletal:        General: Swelling present.  Skin:    Capillary Refill: Capillary  refill takes 2 to 3 seconds.  Neurological:     General: No focal deficit present.     Mental Status: She is alert and oriented to person, place, and time.      Ancillary tests (personally reviewed)  CBC: Recent Labs  Lab 01/04/19 0607 01/06/19 2156 01/07/19 0827 01/08/19 0631 01/09/19 0729  WBC  --  7.8 8.8 10.0 9.6  NEUTROABS  --  4.6 5.9  --  5.9  HGB 8.0* 8.0* 8.8* 7.4* 6.9*  HCT 25.4* 25.6* 28.6* 23.4* 22.5*  MCV  --  90.5 91.7 90.0 89.6  PLT  --  188 190 168 149*    Basic Metabolic Panel: Recent Labs  Lab 01/03/19 0452 01/04/19 0607 01/06/19 2156 01/07/19 0827 01/08/19 0631 01/09/19 0729  NA 140 140 134* 136 135 134*  134*  K 3.6 3.9 4.1 4.1 4.4 4.8  4.8  CL 113* 113* 105 107 105 106  105  CO2 17* 16* 17* 17* 15* 15*  17*  GLUCOSE 89 94 126* 91 95 88  89  BUN 69* 69* 74* 71* 70* 72*  73*  CREATININE 6.95* 6.80* 6.93* 6.82* 7.26* 7.50*  7.76*  CALCIUM 7.8* 7.7* 7.7* 7.8* 7.7* 8.0*  8.0*  MG  --   --   --   --  2.0 2.0  PHOS 6.7* 6.8*  --   --  7.4* 8.2*  8.2*   GFR: Estimated Creatinine Clearance: 9.4 mL/min (A) (by C-G formula based on SCr of 7.5 mg/dL (H)). Recent Labs  Lab 01/06/19 2156 01/07/19 0827 01/08/19 0631 01/09/19 0729  PROCALCITON  --  <0.10  --   --   WBC 7.8 8.8 10.0 9.6  LATICACIDVEN 0.8  --   --   --     Liver Function Tests: Recent Labs  Lab 01/04/19 0607 01/06/19 2156 01/07/19 0827 01/08/19 0631 01/09/19 0729  AST  --  17 16 12* 11*  ALT  --  '12 11 10 7  '$ ALKPHOS  --  57 60 49 49  BILITOT  --  0.7 0.7 0.7 0.8  PROT  --  6.3* 6.7 5.2* 5.1*  ALBUMIN 2.7* 3.2* 3.2* 2.4*  2.4* 2.4*  2.4*   No results for input(s): LIPASE, AMYLASE in the last 168 hours. No results for input(s): AMMONIA in the last 168 hours.  ABG    Component Value Date/Time   PHART 7.445 (  H) 06/05/2011 0545   PCO2ART 37.4 06/05/2011 0545   PO2ART 80.8 06/05/2011 0545   HCO3 25.3 (H) 06/05/2011 0545   TCO2 21 12/16/2015 1539   O2SAT 96.3  06/05/2011 0545     Coagulation Profile: No results for input(s): INR, PROTIME in the last 168 hours.  Cardiac Enzymes: Recent Labs  Lab 01/07/19 0827 01/07/19 1119 01/07/19 1835 01/07/19 2255 01/08/19 0631 01/09/19 0729  CKTOTAL  --  34*  --   --  27* 33*  TROPONINI 0.09* 0.07* 0.07* 0.06*  --   --     HbA1C: Hgb A1c MFr Bld  Date/Time Value Ref Range Status  05/19/2017 07:10 PM 5.6 4.8 - 5.6 % Final    Comment:    (NOTE) Pre diabetes:          5.7%-6.4% Diabetes:              >6.4% Glycemic control for   <7.0% adults with diabetes   12/17/2015 04:00 AM 6.2 (H) 4.8 - 5.6 % Final    Comment:    (NOTE)         Pre-diabetes: 5.7 - 6.4         Diabetes: >6.4         Glycemic control for adults with diabetes: <7.0     CBG: Recent Labs  Lab 01/03/19 1201  GLUCAP 71     Assessment & Plan:    COVID-19 negative with low pretest probability of disease given alternate diagnosis. Elevated nonspecific biomarkers due to possible perinephric abscess versus hematoma Increasing dyspnea due to volume overload in the context of worsening renal failure.  Recommendations:  Clinically in no distress therefore appropriate to remain on PCU Expedite dialysis High-dose diuretic with sequential nephron blockade as temporizing measure Agree with echocardiogram and urinary ultrasound and possible drainage of perinephric abscess.  Please reconsult if further questions arise.   Critical care time: N/A    Kipp Brood, MD [facility] ICU Physician [place]  Pager: [phone] Mobile: [phone] After hours: [phone].  01/09/2019, 5:16 PM

## 2019-01-09 NOTE — Progress Notes (Signed)
Patient completed 1 unit of PRBC's. O2 SAT  85-87% on 4 liters nasal canula. Placed patient on hi-flow nasal canula at 10 liters. O2 SAT is now 95-96% Lasix 80 mg given post 1 unit PRBC's. Alert and oriented  X 4. Call bell within reach. Encouraged to report signs and symptoms to staff.

## 2019-01-09 NOTE — Progress Notes (Addendum)
PROGRESS NOTE    ATLAS KUC  XNA:[id number] DOB: 08/21/1966 DOA: 01/06/2019 PCP: Redmond School, MD   Brief Narrative:  The patient is a 53 year old Caucasian female with a past medical history significant for but not limited to pretension, CAD, GERD, ITP status post splenectomy, history of PE in 2013, history of CKD stage III and recent AKI on CKD who underwent a kidney biopsy of 01/03/2019 with subsequent left flank pain since discharge associated with a fever and a cough.  She contacted nephrology and she was told to present to the ED and she reports that she had a fever 103 with a slightly productive cough.  Is unclear if she had yellowish sputum but denies any other complaints except back pain.  In the ED she underwent a chest abdomen pelvis CT scan which showed cardiomegaly and bilateral pleural effusions with smooth septal thickening consistent with pulmonary edema.  There is also patchy bilateral groundglass opacities in both lungs that are nonspecific and could have represented a viral pneumonia or COVID-19 disease but most likely was alveolar edema, vs infectious/inflammatory . CT of the Abdomen and Pelvis also showed a recent left renal biopsy with a moderate-sized left extrarenal peri-nephric hematoma with a small foci of air within the hematoma raising a ? About superimposed infection.  Admitted for her fever and blood cultures and urine culture were obtained and she was suspected to have COVID-19 disease secondary to her CT of the chest findings  **Interim History Renal Function and Hb Worsened. Nephrology waiting for repeat labs in AM to decide on Dialysis but She is to be transfused 1 unit of pRBC's this AM and then given IV Lasix 80 mg afterwards and became acutely worsened with dyspnea. Given an additional 80 mg IV Lasix and 10 mg of Metolazone. PCCM was consulted for further evaluation given her worsening Respiratory Status and Case was discussed with ID who recommended discontinuing  Droplet and Contact Precautions.    Assessment & Plan:   Principal Problem:   Fever Active Problems:   Diabetes mellitus type 2, uncomplicated (HCC)   CAD (coronary artery disease): total distal RCA EF normal (05/20/17)   CKD (chronic kidney disease), stage V (HCC)   Cough  Fever ? Perinephric Abscess and Hematoma vs. COVID-19;  COVID ruled out as testing was Negative  -Blood culture x2 showed NGTD at 3 Days; Procalcitonin was <10 -Checking Inflammatory Markers and Labs: CRP was 5.9, ESR was 61,  Ferritin was 273, CK was 34, LDH was 232, IL-6 was 79.0, D-Dimer was 2.16 -Repeat Markers showed 4/12 Labs showed CK of 27, Ferritin of 293, CRP of 9.7, ESR of 62, and D-Dimer of 1.87 and IL-6 of 79.0; AST is 12 -Checked COVID-19 SARS-CoV-2 and this was NEGATIVE -Labs today showed Ferritin of 395, CRP of 13.4, CK of 33, ESR of 66 and D-Dimer of 1.87 -Urinalysis moderate hemoglobin, 100 protein, rare bacteria, hyaline casts present, 6-10 RBCs per high-power field, 0 to 5 g epithelial cells, 11-20 WBCs and Trace Leukocytes -Received Ceftriaxone but then was started on IV Cefepime empirically pharmacy to dose -Influenza A/B via PCR Negative and Respiratory Virus Panel Negative  -Continue to Monitor and Trend D-Dimer (trending down slowly) -Will Consider addition of Vanco if urine culture + Staph  -Continue to Monitor Temperature Curve and Cultures -Patient was Febrile at 100.8 yesterday AM but not today  -Was considering adding empiric Hydroxychloroquine since she is worsening but she is COVID NEGATIVE so will hold off -Discontinue Zinc Sulfate,  and Vitamin C 500 mg po Daily as feel as if she does not have COVID Disease -Will reorder Renal U/S  -I discussed with Dr. Tommy Medal of Infectious Disease who feels that Fever could have been related to Atelectasis vs. Perinephric Abscess and feels that Respiratory Status could be explained by Volume Overload. Will Discontinue Airborne and Drop  Acute  Respiratory Failure with Hypoxia, worsening in the setting of Pneumonia vs. ARDS from Perinephric Abscess vs. Volume Overload in the setting of Renal Failure  -Continue Supplemental O2 via [place] and Wean O2 as Tolerated -Continuous Pulse Oximetry and Maintain O2 Saturations >92% -As Above  -O2 Requirement increased and is now on 10 Liters HFNC and has been Desaturating some with some Tachypenia; Worsened after 1 unit of pRBCs so concern for Volume Overload -Gave IV Lasix 40 mg x1 yesterday and will get IV Lasix 80 mg x1 after Blood; Discussed with Nephrology and will give her an additional IV Lasix 80 mg now -Add Combivent 4 times daily and q6hprn; -Repeat CXR yesterday showed "Developing pneumonia involving the RIGHT upper lobe. Moderate to large BILATERAL pleural effusions, increased in size on the RIGHT and unchanged on the LEFT. Associated dense BILATERAL lower lobe atelectasis and/or pneumonia. Stable mild cardiomegaly without pulmonary edema currently as the pulmonary vascularity is now normal" -CXR this AM showed "Stable cardiomediastinal silhouette. Normal pulmonary vascularity. Worsening patchy opacity in the right upper lobe. Unchanged moderate left and small right pleural effusions with left greater than right lower lobe atelectasis/consolidation. No pneumothorax. No acute osseous abnormality." -Cough can be explained by ? HCAP vs. Pulmonary Volume Overload  -Will get STAT CXR now because Blood is done and she became more dyspneic  -Treatment as above  -CHECK ECHOCardiogram  -Discussed with Nephrology and they were going to continue to Monitor Labs before initiating Dialysis but given her worsening Respiratory Status they will order Dialysis Catheter to be placed by IR and likely initiate Dialysis  -Would Like to get an ECHO but unfortunately can't be done until COVID Testing is ruled out; Possible Bedside ECHO to be done by PCCM -Start Guaifenesin 1200 mg po BID and Tussionex 5 mL po  q12hprn -Continue to Monitor Respiratory Status and Repeat CXR in AM  -Will Call Pulmonary and have them Evaluate  L Flank Painin the setting of Hematoma vs Perinephric Abscess -Hold Aspirin in the setting of Worsening Hb -Monitor Hgb; Patient's Hb/Hct went from 8.0/25.6 -> 8.8/28.6 -> 7.4/23.4 -> 6.9/22.5 -Will Type and Screen and and Transfuse 1 unit of pRBC -C/w Dilaudid 0.'5mg'$  iv q4h prn -Nephrology consulted and appreciate their input -Will re-order Renal U/S now that she is COVID Negative and Case was Discussed with both Pulmonary and ID   CAD -Continue  Isosorbide '10mg'$  po TID -ASA currently being Held due to Hematoma -C/w Metoprolol '25mg'$  po BID  AKI on CKD Stage 3, now CKD stage5 Non-Gap Metabolic Acidosis; AG is 15 -BUN/Cr is now gone from 74/6.93 -> 71/6.82 -> 70/7.26 -> 72/7.50 (73/7.76) -Continue with Sodium Bicarbonate '650mg'$  po bid -Pateint was getting IVF with NS at 50 mL/hr and now stopped  -Nephrology Consulted for further evaluation and recommendations and Dr. Justin Mend stated patient has a baseline serum creatinine of 1.5 and exposure to ciprofloxacin along with hematuria. -Dr. Justin Mend discussed the case with Dr. 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 Posey Pronto who recommends giving a dose of IV Lasix 80 mg x 1 after transfusion of 1 unit of pRBC's and because she went into Respiratory Distress will give an additional 80 mg of Lasix and 10 mg of Metolazone po  -Patient will be made n.p.o. in case a temporary dialysis catheter needs to be Placed  -Monitor strict I's and O's; Patient is +3.231 Liters  -Check Hepatitis Panel  -C/w Sodium Bicarbnate 650 mg po TID for Acidosis; CO2 this AM was 15 and AG was 13 -Patient remains  volume overloaded and had very minimal urine output throughout the whole day yesterday; She is +2.891 Liters since Admission -May need Foley Catheter for closer UOP Monitoring -Renal U/S Cannot be done due COVID-19  Hypertension -Continue with Metoprolol 25 mg p.o. twice daily along with Amlodipine 10 mg p.o. daily and Isosorbide 10 mg po TID -Continue to Monitor Blood Pressures Carefully -BP was low at 114/83  Anxiety -Initially Held Zoloft '100mg'$  po qday due to ? Allergy per computer but states it was causing diarrhea at higher doses so resumedd  Normocytic Anemia -Patient's hemoglobin/hematocrit went from 8.0/25.6 is now 8.8/28.6 -> 7.4/23.4 -> 6.9/22.5 -Checked Anemia Panel and iron level was 12, U IBC was 237, TIBC was 249, saturation ratios 5%, ferritin level was 273 -Nephrology giving the patient IV Ferric Gluconate 125 mg x8 doses and today was Day 2/8  -Continue monitor for signs due to bleeding -Repeat CBC in the a.m.  Hyperphosphatemia -Patient's Phos Level is now worsened to 8.2 -In the setting of Renal Dysfunction -Likely to be corrected in Dialysis  -Continue to Monitor and Repeat Phos Level in AM   Obesity -Estimated body mass index is 31.77 kg/m as calculated from the following:   Height as of this encounter: '5\' 5"'$  (1.651 m).   Weight as of this encounter: 86.6 kg. -Weight Loss and Dietary Counseling given  Thrombocytopenia -Patient's Platelet Count went from 190 -> 168 -> 149 -Continue to Monitor for S/Sx of Bleeding -Repeat CBC in AM    Hyponatremia -Patient's Na+ was 134 -Continue to Monitor and Trend and repeat CMP in AM   DVT prophylaxis: No Pharmacologic AC due to Hbg Drop Code Status: FULL CODE Family Communication: No family present at bedside and tried to call Daughter to Update her but no reply Disposition Plan: Remain Inpatient for further evaluation and Treatment  Consultants:   Nephrology  PCCM/Critical Care  Interventional Radiology    Discussed with Infectious Diseases Dr. Tommy Medal    Procedures: Temp Catheter to be placed   Antimicrobials: Anti-infectives (From admission, onward)   Start     Dose/Rate Route Frequency Ordered Stop   01/10/19 1000  ceFEPIme (MAXIPIME) 1 g in sodium chloride 0.9 % 100 mL IVPB     1 g 200 mL/hr over 30 Minutes Intravenous Every 24 hours 01/09/19 1217     01/07/19 1200  ceFEPIme (MAXIPIME) 500 mg in dextrose 5 % 50 mL IVPB  Status:  Discontinued     500 mg 100 mL/hr over 30 Minutes Intravenous Every 24 hours 01/07/19 1053 01/09/19 1217   01/07/19 0100  cefTRIAXone (ROCEPHIN) 1 g in sodium chloride 0.9 % 100 mL IVPB     1 g 200 mL/hr over 30 Minutes Intravenous  Once 01/07/19 0054 01/07/19 0258     Subjective: Seen and examined at bedside and states she was not  short of breath this morning and states is stable.  Was concerned about eating.  Denies any lightheadedness or dizziness but did feel a little swollen.  States that she is continually coughing but during the Tussionex last night helped a little bit.  Concerned that her hemoglobin and her kidney function got worse.  Objective: Vitals:   01/09/19 1330 01/09/19 1345 01/09/19 1400 01/09/19 1415  BP: 128/84 119/76 120/79 114/83  Pulse: 77 78 79 78  Resp: (!) 24 (!) 26 (!) 26 (!) 24  Temp:      TempSrc:      SpO2: 92% 91% 91% (!) 89%  Weight:      Height:        Intake/Output Summary (Last 24 hours) at 01/09/2019 1521 Last data filed at 01/09/2019 0600 Gross per 24 hour  Intake 548.68 ml  Output 275 ml  Net 273.68 ml   Filed Weights   01/06/19 2116  Weight: 86.6 kg   Examination: Physical Exam:  Constitutional: Well-nourished, well-developed morbidly obese Caucasian female currently no acute distress appears calm and a little more drowsy Eyes: Lids and conjunctive are normal.  Sclera anicteric ENMT: External ears and nose appear normal.  Grossly normal hearing.  Mucous members are moist Neck: Appears supple but  does have some worsened JVD Respiratory: Diminished to auscultation bilaterally with no appreciable wheezing, rales, rhonchi but does have some crackles and some mild tachypnea.  She is wearing supplemental oxygen via nasal cannula and is on 4 L this morning. Cardiovascular: Slightly tachycardic but no appreciable murmurs, rubs, gallops.  Has 1-2+ lower extremity edema. Abdomen: Soft, nontender, distended secondary body habitus.  Bowel sounds present GU: Deferred Musculoskeletal: No clubbing or cyanosis.  No joint deformities in the upper lower extremities Skin: No appreciable rashes or lesions on limited skin evaluation. Neurologic: Cranial nerves II through XII grossly intact no appreciable focal deficits.  Romberg sign cerebellar reflexes were not assessed. Psychiatric: Normal judgment and insight.  She is slightly drowsy but she is awake, alert, oriented.  Has a slightly depressed affect and mood.  Data Reviewed: I have personally reviewed following labs and imaging studies  CBC: Recent Labs  Lab 01/04/19 0607 01/06/19 2156 01/07/19 0827 01/08/19 0631 01/09/19 0729  WBC  --  7.8 8.8 10.0 9.6  NEUTROABS  --  4.6 5.9  --  5.9  HGB 8.0* 8.0* 8.8* 7.4* 6.9*  HCT 25.4* 25.6* 28.6* 23.4* 22.5*  MCV  --  90.5 91.7 90.0 89.6  PLT  --  188 190 168 850*   Basic Metabolic Panel: Recent Labs  Lab 01/03/19 0452 01/04/19 0607 01/06/19 2156 01/07/19 0827 01/08/19 0631 01/09/19 0729  NA 140 140 134* 136 135 134*  134*  K 3.6 3.9 4.1 4.1 4.4 4.8  4.8  CL 113* 113* 105 107 105 106  105  CO2 17* 16* 17* 17* 15* 15*  17*  GLUCOSE 89 94 126* 91 95 88  89  BUN 69* 69* 74* 71* 70* 72*  73*  CREATININE 6.95* 6.80* 6.93* 6.82* 7.26* 7.50*  7.76*  CALCIUM 7.8* 7.7* 7.7* 7.8* 7.7* 8.0*  8.0*  MG  --   --   --   --  2.0 2.0  PHOS 6.7* 6.8*  --   --  7.4* 8.2*  8.2*   GFR: Estimated Creatinine Clearance: 9.4 mL/min (A) (by C-G formula based on SCr of 7.5 mg/dL (H)). Liver Function  Tests: Recent Labs  Lab 01/04/19 [phone] 01/06/19 2156 01/07/19 0827  01/08/19 0631 01/09/19 0729  AST  --  17 16 12* 11*  ALT  --  '12 11 10 7  '$ ALKPHOS  --  57 60 49 49  BILITOT  --  0.7 0.7 0.7 0.8  PROT  --  6.3* 6.7 5.2* 5.1*  ALBUMIN 2.7* 3.2* 3.2* 2.4*  2.4* 2.4*  2.4*   No results for input(s): LIPASE, AMYLASE in the last 168 hours. No results for input(s): AMMONIA in the last 168 hours. Coagulation Profile: No results for input(s): INR, PROTIME in the last 168 hours. Cardiac Enzymes: Recent Labs  Lab 01/07/19 0827 01/07/19 1119 01/07/19 1835 01/07/19 2255 01/08/19 0631 01/09/19 0729  CKTOTAL  --  34*  --   --  27* 33*  TROPONINI 0.09* 0.07* 0.07* 0.06*  --   --    BNP (last 3 results) No results for input(s): PROBNP in the last 8760 hours. HbA1C: No results for input(s): HGBA1C in the last 72 hours. CBG: Recent Labs  Lab 01/03/19 1201  GLUCAP 71   Lipid Profile: No results for input(s): CHOL, HDL, LDLCALC, TRIG, CHOLHDL, LDLDIRECT in the last 72 hours. Thyroid Function Tests: No results for input(s): TSH, T4TOTAL, FREET4, T3FREE, THYROIDAB in the last 72 hours. Anemia Panel: Recent Labs    01/07/19 1119 01/08/19 0631 01/09/19 0729  FERRITIN  --  293 395*  TIBC 249*  --   --   IRON 12*  --   --    Sepsis Labs: Recent Labs  Lab 01/06/19 2156 01/07/19 0827  PROCALCITON  --  <0.10  LATICACIDVEN 0.8  --     Recent Results (from the past 240 hour(s))  Culture, Urine     Status: None   Collection Time: 12/31/18  2:56 AM  Result Value Ref Range Status   Specimen Description   Final    URINE, CLEAN CATCH Performed at [facility], [address], [place], [place] [address]    Special Requests   Final    NONE Performed at [facility], [address] [place], [place] [address]    Culture   Final    NO GROWTH Performed at [facility], [place] [address], [place], [place] [address]    Report Status 01/01/2019 FINAL  Final  Blood Culture  (routine x 2)     Status: None (Preliminary result)   Collection Time: 01/06/19  9:56 PM  Result Value Ref Range Status   Specimen Description RIGHT ANTECUBITAL  Final   Special Requests   Final    BOTTLES DRAWN AEROBIC AND ANAEROBIC Blood Culture adequate volume   Culture   Final    NO GROWTH 3 DAYS Performed at [facility], [address], [place], [place] [address]    Report Status PENDING  Incomplete  Blood Culture (routine x 2)     Status: None (Preliminary result)   Collection Time: 01/06/19 10:00 PM  Result Value Ref Range Status   Specimen Description LEFT ANTECUBITAL  Final   Special Requests   Final    BOTTLES DRAWN AEROBIC AND ANAEROBIC Blood Culture adequate volume   Culture   Final    NO GROWTH 3 DAYS Performed at [facility], [address] [place], [place] [address]    Report Status PENDING  Incomplete  Respiratory Panel by PCR     Status: None   Collection Time: 01/07/19  7:20 AM  Result Value Ref Range Status   Adenovirus NOT DETECTED NOT DETECTED Final   Coronavirus 229E  NOT DETECTED NOT DETECTED Final    Comment: (NOTE) The Coronavirus on the Respiratory Panel, DOES NOT test for the novel  Coronavirus (2019 nCoV)    Coronavirus HKU1 NOT DETECTED NOT DETECTED Final   Coronavirus NL63 NOT DETECTED NOT DETECTED Final   Coronavirus OC43 NOT DETECTED NOT DETECTED Final   Metapneumovirus NOT DETECTED NOT DETECTED Final   Rhinovirus / Enterovirus NOT DETECTED NOT DETECTED Final   Influenza A NOT DETECTED NOT DETECTED Final   Influenza B NOT DETECTED NOT DETECTED Final   Parainfluenza Virus 1 NOT DETECTED NOT DETECTED Final   Parainfluenza Virus 2 NOT DETECTED NOT DETECTED Final   Parainfluenza Virus 3 NOT DETECTED NOT DETECTED Final   Parainfluenza Virus 4 NOT DETECTED NOT DETECTED Final   Respiratory Syncytial Virus NOT DETECTED NOT DETECTED Final   Bordetella pertussis NOT DETECTED NOT DETECTED Final   Chlamydophila pneumoniae NOT DETECTED NOT DETECTED Final    Mycoplasma pneumoniae NOT DETECTED NOT DETECTED Final    Comment: Performed at [facility], [place] [address] [place], [place] [address]    Radiology Studies: Dg Chest Port 1 View  Result Date: 01/09/2019 CLINICAL DATA:  Cough and shortness of breath. EXAM: PORTABLE CHEST 1 VIEW COMPARISON:  Chest x-ray from yesterday. FINDINGS: Stable cardiomediastinal silhouette. Normal pulmonary vascularity. Worsening patchy opacity in the right upper lobe. Unchanged moderate left and small right pleural effusions with left greater than right lower lobe atelectasis/consolidation. No pneumothorax. No acute osseous abnormality. IMPRESSION: 1. Worsening right upper lobe pneumonia. 2. Unchanged moderate left and small right pleural effusions with left greater than right lower lobe atelectasis/infiltrate. Electronically Signed   By: Titus Dubin M.D.   On: 01/09/2019 08:41   Dg Chest Port 1 View  Result Date: 01/08/2019 CLINICAL DATA:  Cough, chest congestion and shortness of breath. Follow-up CHF versus pneumonia. EXAM: PORTABLE CHEST 1 VIEW COMPARISON:  CT chest and chest x-ray 01/06/2019 and earlier. FINDINGS: Cardiac silhouette mildly enlarged, unchanged. Pulmonary vascularity now normal. Developing patchy airspace opacities in the RIGHT UPPER LOBE. Moderate to large BILATERAL pleural effusions, increased in size on the RIGHT and unchanged on the LEFT, with associated dense consolidation in the lower lobes. IMPRESSION: 1. Developing pneumonia involving the RIGHT upper lobe. 2. Moderate to large BILATERAL pleural effusions, increased in size on the RIGHT and unchanged on the LEFT. Associated dense BILATERAL lower lobe atelectasis and/or pneumonia. 3. Stable mild cardiomegaly without pulmonary edema currently as the pulmonary vascularity is now normal. Electronically Signed   By: Evangeline Dakin M.D.   On: 01/08/2019 11:00   Scheduled Meds: . ALPRAZolam  0.5 mg Oral TID  . amLODipine  10 mg Oral Daily  .  calcium acetate  1,334 mg Oral TID WC  . guaiFENesin  1,200 mg Oral BID  . Ipratropium-Albuterol  1 puff Inhalation QID  . isosorbide dinitrate  10 mg Oral BID  . metoprolol tartrate  25 mg Oral BID  . sertraline  100 mg Oral Daily  . sodium bicarbonate  650 mg Oral TID  . sodium chloride flush  3 mL Intravenous Q12H  . vitamin C  500 mg Oral Daily  . zinc sulfate  220 mg Oral Daily   Continuous Infusions: . sodium chloride    . [START ON 01/10/2019] ceFEPime (MAXIPIME) IV    . ferric gluconate (FERRLECIT/NULECIT) IV 125 mg (01/09/19 1023)    LOS: 2 days   Kerney Elbe, DO [facility] PAGER is on [place]  If 7PM-7AM,  please contact night-coverage [email or web address] Password [facility] 01/09/2019, 3:21 PM

## 2019-01-10 ENCOUNTER — Inpatient Hospital Stay (HOSPITAL_COMMUNITY): Payer: Medicare Other

## 2019-01-10 ENCOUNTER — Encounter (HOSPITAL_COMMUNITY): Payer: Self-pay | Admitting: Diagnostic Radiology

## 2019-01-10 DIAGNOSIS — I361 Nonrheumatic tricuspid (valve) insufficiency: Secondary | ICD-10-CM

## 2019-01-10 HISTORY — PX: IR FLUORO GUIDE CV LINE RIGHT: IMG2283

## 2019-01-10 HISTORY — PX: IR US GUIDE VASC ACCESS RIGHT: IMG2390

## 2019-01-10 LAB — CK: Total CK: 28 U/L — ABNORMAL LOW (ref 38–234)

## 2019-01-10 LAB — ECHOCARDIOGRAM LIMITED
Height: 65 in
Weight: 3326.3 oz

## 2019-01-10 LAB — CBC WITH DIFFERENTIAL/PLATELET
Abs Immature Granulocytes: 0.03 10*3/uL (ref 0.00–0.07)
Basophils Absolute: 0.1 10*3/uL (ref 0.0–0.1)
Basophils Relative: 1 %
Eosinophils Absolute: 0.3 10*3/uL (ref 0.0–0.5)
Eosinophils Relative: 3 %
HCT: 24.6 % — ABNORMAL LOW (ref 36.0–46.0)
Hemoglobin: 7.8 g/dL — ABNORMAL LOW (ref 12.0–15.0)
Immature Granulocytes: 0 %
Lymphocytes Relative: 22 %
Lymphs Abs: 2.3 10*3/uL (ref 0.7–4.0)
MCH: 28.3 pg (ref 26.0–34.0)
MCHC: 31.7 g/dL (ref 30.0–36.0)
MCV: 89.1 fL (ref 80.0–100.0)
Monocytes Absolute: 1 10*3/uL (ref 0.1–1.0)
Monocytes Relative: 10 %
Neutro Abs: 6.6 10*3/uL (ref 1.7–7.7)
Neutrophils Relative %: 64 %
Platelets: 153 10*3/uL (ref 150–400)
RBC: 2.76 MIL/uL — ABNORMAL LOW (ref 3.87–5.11)
RDW: 15.1 % (ref 11.5–15.5)
WBC: 10.2 10*3/uL (ref 4.0–10.5)
nRBC: 0 % (ref 0.0–0.2)

## 2019-01-10 LAB — RENAL FUNCTION PANEL
Albumin: 2.3 g/dL — ABNORMAL LOW (ref 3.5–5.0)
Anion gap: 13 (ref 5–15)
BUN: 81 mg/dL — ABNORMAL HIGH (ref 6–20)
CO2: 15 mmol/L — ABNORMAL LOW (ref 22–32)
Calcium: 8.1 mg/dL — ABNORMAL LOW (ref 8.9–10.3)
Chloride: 106 mmol/L (ref 98–111)
Creatinine, Ser: 7.86 mg/dL — ABNORMAL HIGH (ref 0.44–1.00)
GFR calc Af Amer: 6 mL/min — ABNORMAL LOW (ref >60)
GFR calc non Af Amer: 5 mL/min — ABNORMAL LOW (ref >60)
Glucose, Bld: 92 mg/dL (ref 70–99)
Phosphorus: 8.6 mg/dL — ABNORMAL HIGH (ref 2.5–4.6)
Potassium: 4.4 mmol/L (ref 3.5–5.1)
Sodium: 134 mmol/L — ABNORMAL LOW (ref 135–145)

## 2019-01-10 LAB — BPAM RBC
Blood Product Expiration Date: 202004182359
ISSUE DATE / TIME: 202004131100
Unit Type and Rh: 6200

## 2019-01-10 LAB — COMPREHENSIVE METABOLIC PANEL
ALT: 8 U/L (ref 0–44)
AST: 12 U/L — ABNORMAL LOW (ref 15–41)
Albumin: 2.3 g/dL — ABNORMAL LOW (ref 3.5–5.0)
Alkaline Phosphatase: 45 U/L (ref 38–126)
Anion gap: 14 (ref 5–15)
BUN: 81 mg/dL — ABNORMAL HIGH (ref 6–20)
CO2: 14 mmol/L — ABNORMAL LOW (ref 22–32)
Calcium: 8.1 mg/dL — ABNORMAL LOW (ref 8.9–10.3)
Chloride: 106 mmol/L (ref 98–111)
Creatinine, Ser: 7.76 mg/dL — ABNORMAL HIGH (ref 0.44–1.00)
GFR calc Af Amer: 6 mL/min — ABNORMAL LOW (ref >60)
GFR calc non Af Amer: 5 mL/min — ABNORMAL LOW (ref >60)
Glucose, Bld: 89 mg/dL (ref 70–99)
Potassium: 4.3 mmol/L (ref 3.5–5.1)
Sodium: 134 mmol/L — ABNORMAL LOW (ref 135–145)
Total Bilirubin: 1.1 mg/dL (ref 0.3–1.2)
Total Protein: 5.4 g/dL — ABNORMAL LOW (ref 6.5–8.1)

## 2019-01-10 LAB — HEPATITIS PANEL, ACUTE
HCV Ab: <0.1 s/co ratio (ref 0.0–0.9)
Hep A IgM: NEGATIVE
Hep B C IgM: NEGATIVE
Hepatitis B Surface Ag: NEGATIVE

## 2019-01-10 LAB — TYPE AND SCREEN
ABO/RH(D): A POS
Antibody Screen: NEGATIVE
Unit division: 0

## 2019-01-10 LAB — C3 COMPLEMENT: C3 Complement: 103 mg/dL (ref 82–167)

## 2019-01-10 LAB — D-DIMER, QUANTITATIVE: D-Dimer, Quant: 2.86 ug/mL-FEU — ABNORMAL HIGH (ref 0.00–0.50)

## 2019-01-10 LAB — LACTATE DEHYDROGENASE: LDH: 196 U/L — ABNORMAL HIGH (ref 98–192)

## 2019-01-10 LAB — PARATHYROID HORMONE, INTACT (NO CA): PTH: 262 pg/mL — ABNORMAL HIGH (ref 15–65)

## 2019-01-10 LAB — ANTISTREPTOLYSIN O TITER: ASO: <20 IU/mL (ref 0.0–200.0)

## 2019-01-10 LAB — FERRITIN: Ferritin: 503 ng/mL — ABNORMAL HIGH (ref 11–307)

## 2019-01-10 LAB — C4 COMPLEMENT: Complement C4, Body Fluid: 22 mg/dL (ref 14–44)

## 2019-01-10 LAB — TROPONIN I: Troponin I: 0.05 ng/mL (ref <0.03)

## 2019-01-10 LAB — SEDIMENTATION RATE: Sed Rate: 70 mm/hr — ABNORMAL HIGH (ref 0–22)

## 2019-01-10 LAB — ANTINUCLEAR ANTIBODIES, IFA: ANA Ab, IFA: NEGATIVE

## 2019-01-10 LAB — TRIGLYCERIDES: Triglycerides: 112 mg/dL (ref <150)

## 2019-01-10 LAB — MAGNESIUM: Magnesium: 2 mg/dL (ref 1.7–2.4)

## 2019-01-10 LAB — PHOSPHORUS: Phosphorus: 8.4 mg/dL — ABNORMAL HIGH (ref 2.5–4.6)

## 2019-01-10 LAB — C-REACTIVE PROTEIN: CRP: 19.1 mg/dL — ABNORMAL HIGH (ref <1.0)

## 2019-01-10 MED ORDER — LIDOCAINE HCL (PF) 1 % IJ SOLN
INTRAMUSCULAR | Status: AC | PRN
  Administered 2019-01-10: 10 mL

## 2019-01-10 MED ORDER — GELATIN ABSORBABLE 12-7 MM EX MISC
CUTANEOUS | Status: AC
  Filled 2019-01-10: qty 1

## 2019-01-10 MED ORDER — ALBUTEROL SULFATE (2.5 MG/3ML) 0.083% IN NEBU: 2.5000 mg | INHALATION_SOLUTION | RESPIRATORY_TRACT | Status: DC | PRN

## 2019-01-10 MED ORDER — VANCOMYCIN HCL IN DEXTROSE 1-5 GM/200ML-% IV SOLN
INTRAVENOUS | Status: AC
  Filled 2019-01-10: qty 200

## 2019-01-10 MED ORDER — LIDOCAINE HCL 1 % IJ SOLN
INTRAMUSCULAR | Status: AC
  Filled 2019-01-10: qty 20

## 2019-01-10 MED ORDER — VANCOMYCIN HCL IN DEXTROSE 750-5 MG/150ML-% IV SOLN
INTRAVENOUS | Status: AC | PRN
  Administered 2019-01-10: 1000 mg via INTRAVENOUS

## 2019-01-10 MED ORDER — HEPARIN SODIUM (PORCINE) 1000 UNIT/ML IJ SOLN
INTRAMUSCULAR | Status: AC
  Administered 2019-01-10: 3800 [IU]
  Filled 2019-01-10: qty 1

## 2019-01-10 MED ORDER — HEPARIN SODIUM (PORCINE) 1000 UNIT/ML IJ SOLN
INTRAMUSCULAR | Status: AC
  Filled 2019-01-10: qty 4

## 2019-01-10 MED ORDER — IPRATROPIUM-ALBUTEROL 0.5-2.5 (3) MG/3ML IN SOLN
3.0000 mL | Freq: Four times a day (QID) | RESPIRATORY_TRACT | Status: DC
  Administered 2019-01-10 – 2019-01-11 (×3): 3 mL via RESPIRATORY_TRACT
  Filled 2019-01-10 (×3): qty 3

## 2019-01-10 MED ORDER — FENTANYL CITRATE (PF) 100 MCG/2ML IJ SOLN
INTRAMUSCULAR | Status: AC
  Filled 2019-01-10: qty 2

## 2019-01-10 MED ORDER — IPRATROPIUM-ALBUTEROL 0.5-2.5 (3) MG/3ML IN SOLN: 3.0000 mL | Freq: Four times a day (QID) | RESPIRATORY_TRACT | Status: DC | PRN

## 2019-01-10 MED ORDER — CHLORHEXIDINE GLUCONATE CLOTH 2 % EX PADS
6.0000 | MEDICATED_PAD | Freq: Every day | CUTANEOUS | Status: DC
  Administered 2019-01-11 – 2019-01-15 (×5): 6 via TOPICAL

## 2019-01-10 MED ORDER — MIDAZOLAM HCL 2 MG/2ML IJ SOLN
INTRAMUSCULAR | Status: AC
  Filled 2019-01-10: qty 2

## 2019-01-10 NOTE — Sedation Documentation (Signed)
Patient arrived to IR holding on [facility]. Patient has labored breathing with accessory muscle usage, and unable to complete a full sentence without gasping for air. While head elevated patient oxygen saturation in low 90s but when laid flat in position needed to do procedure patient oxygen sats drop to high 80s. MD, Anselm Pancoast aware

## 2019-01-10 NOTE — Progress Notes (Signed)
Patient ID: Summer Cook, female   DOB: 1966-02-04, 53 y.o.   MRN: [id number]  PROGRESS NOTE    LILIANN FILE  QIH:[id number] DOB: 08-26-1966 DOA: 01/06/2019 PCP: Redmond School, MD   Brief Narrative:  53 year old female with history of hypertension, CAD, GERD, ITP status post splenectomy, PE in 2013, chronic kidney disease stage III and recent AKI on CKD who underwent kidney biopsy on 01/03/2019 with subsequent left flank pain since discharge associated with fever and cough.  In the ED, CT scan of the chest abdomen and pelvis showed cardiomegaly with bilateral pleural effusions and findings consistent with pulmonary edema along with patchy bilateral groundglass opacities in both lungs.  CT scan of the abdomen and pelvis showed a recent left renal biopsy with moderate sized left extrarenal perinephric hematoma with a small foci of air within the hematoma raising question of superimposed infection.  She was admitted for the same.  Nephrology was consulted.  During the hospitalization, her respiratory status worsened.  PCCM was also consulted.  COVID-19 was tested which turned out negative.  Initially she was kept on isolation but after discussion with ID, droplet and contact precautions were discontinued.  Assessment & Plan:   Principal Problem:   Fever Active Problems:   Diabetes mellitus type 2, uncomplicated (HCC)   CAD (coronary artery disease): total distal RCA EF normal (05/20/17)   CKD (chronic kidney disease), stage V (HCC)   Cough  Fever with concern for perinephric hematoma with superimposed infection/perinephric abscess -CT scan of abdomen on admission was concerning for perinephric hematoma with question of superimposed infection. -Currently on cefepime.  No temperature spikes over the last 24 hours. -Cultures have been negative so far.  Influenza a and B negative; respiratory panel PCR is negative.  COVID-19 testing has been negative. -Because of worsening respiratory status and  concern for COVID-19, she was also given a dose of hydroxychloroquine which was subsequently discontinued after his testing turned out negative.  Case was discussed with ID/Dr. Lucianne Lei and by prior hospitalist who had recommended to discontinue airborne and droplet precautions. -Since she is not spiking temperatures, will hold off on drainage of the perinephric hematoma/abscess.  If patient starts to have persistent temperature spikes, might have to consider rescanning her abdomen with repeat CT and possible ultrasound-guided drainage.  Acute hypoxic respiratory failure -Most likely secondary to fluid overload in the setting of worsening renal failure -PCCM evaluation appreciated: PCCM thinks that her respiratory status is secondary to volume overload and would benefit from dialysis.  - Patient has received 3 doses of intravenous Lasix.  Urine output is very poor. -Currently on 10 L oxygen via high flow nasal cannula.  Hopefully respiratory status will improve after starting dialysis. -Limited echo pending  Perinephric hematoma with drop in hemoglobin in a patient with chronic normocytic anemia from chronic kidney disease -With history of recent renal biopsy. -Status post 1 unit packed red cell transfusion on 01/09/2019.  Hemoglobin is 7.8 today.  Monitor H&H.  Aspirin discontinued  Acute kidney injury on chronic kidney disease stage III Non-gap metabolic acidosis -Creatinine 7.86 today.  Urine output is very minimal.  No improvement with intermittent doses of intravenous Lasix.  Still acidotic. -Nephrology following.  Spoke with Dr. Posey Pronto and patient will need IR guided dialysis catheter placement and dialysis will hopefully be started today.  Monitor renal function. -Continue oral sodium bicarbonate supplementation.  CAD -Continue isosorbide and metoprolol.  Aspirin held due to hematoma.  Hypertension -Monitor blood pressure.  Continue metoprolol,  amlodipine and  isosorbide  Obesity -Outpatient follow-up  DVT prophylaxis: No pharmacologic prophylaxis due to drop in hemoglobin Code Status: Full Family Communication: None at bedside  disposition Plan: Depends on clinical outcome  Consultants: Nephrology/CCM/IR/Case was discussed with ID Dr. Drucilla Schmidt by prior hospitalist  Procedures: None  Antimicrobials:  Anti-infectives (From admission, onward)   Start     Dose/Rate Route Frequency Ordered Stop   01/10/19 1000  ceFEPIme (MAXIPIME) 1 g in sodium chloride 0.9 % 100 mL IVPB     1 g 200 mL/hr over 30 Minutes Intravenous Every 24 hours 01/09/19 1217     01/10/19 1000  hydroxychloroquine (PLAQUENIL) tablet 200 mg  Status:  Discontinued     200 mg Oral 2 times daily 01/09/19 1541 01/09/19 1642   01/09/19 1545  hydroxychloroquine (PLAQUENIL) tablet 400 mg  Status:  Discontinued     400 mg Oral 2 times daily 01/09/19 1541 01/09/19 1642   01/07/19 1200  ceFEPIme (MAXIPIME) 500 mg in dextrose 5 % 50 mL IVPB  Status:  Discontinued     500 mg 100 mL/hr over 30 Minutes Intravenous Every 24 hours 01/07/19 1053 01/09/19 1217   01/07/19 0100  cefTRIAXone (ROCEPHIN) 1 g in sodium chloride 0.9 % 100 mL IVPB     1 g 200 mL/hr over 30 Minutes Intravenous  Once 01/07/19 0054 01/07/19 0258      Subjective: Patient seen and examined at bedside.  She does not feel well and still feels short of breath.  No overnight fever or vomiting.  Feels very weak and tired.  Objective: Vitals:   01/10/19 0100 01/10/19 0435 01/10/19 0500 01/10/19 0824  BP:  109/76    Pulse: 73 67    Resp: 15 17    Temp:  98.3 F (36.8 C)  97.7 F (36.5 C)  TempSrc:  Oral  Axillary  SpO2: 98% 97%    Weight:   94.3 kg   Height:        Intake/Output Summary (Last 24 hours) at 01/10/2019 1020 Last data filed at 01/10/2019 0700 Gross per 24 hour  Intake 490 ml  Output -  Net 490 ml   Filed Weights   01/06/19 2116 01/10/19 0500  Weight: 86.6 kg 94.3 kg    Examination:  General  exam: Appears chronically ill.  No distress. Respiratory system: Bilateral decreased breath sounds at bases with basilar crackles Cardiovascular system: S1 & S2 heard, Rate controlled Gastrointestinal system: Abdomen is distended, soft and nontender. Normal bowel sounds heard. Extremities: No cyanosis, clubbing; 1-2+ edema in the lower extremities Central nervous system: Alert and oriented. No focal neurological deficits. Moving extremities Skin: No rashes, lesions or ulcers Psychiatry: Looks anxious.  Flat affect.    Data Reviewed: I have personally reviewed following labs and imaging studies  CBC: Recent Labs  Lab 01/06/19 2156 01/07/19 0827 01/08/19 0631 01/09/19 0729 01/10/19 0234  WBC 7.8 8.8 10.0 9.6 10.2  NEUTROABS 4.6 5.9  --  5.9 6.6  HGB 8.0* 8.8* 7.4* 6.9* 7.8*  HCT 25.6* 28.6* 23.4* 22.5* 24.6*  MCV 90.5 91.7 90.0 89.6 89.1  PLT 188 190 168 149* 161   Basic Metabolic Panel: Recent Labs  Lab 01/04/19 0607 01/06/19 2156 01/07/19 0827 01/08/19 0631 01/09/19 0729 01/10/19 0234  NA 140 134* 136 135 134*  134* 134*  134*  K 3.9 4.1 4.1 4.4 4.8  4.8 4.3  4.4  CL 113* 105 107 105 106  105 106  106  CO2 16* 17*  17* 15* 15*  17* 14*  15*  GLUCOSE 94 126* 91 95 88  89 89  92  BUN 69* 74* 71* 70* 72*  73* 81*  81*  CREATININE 6.80* 6.93* 6.82* 7.26* 7.50*  7.76* 7.76*  7.86*  CALCIUM 7.7* 7.7* 7.8* 7.7* 8.0*  8.0* 8.1*  8.1*  MG  --   --   --  2.0 2.0 2.0  PHOS 6.8*  --   --  7.4* 8.2*  8.2* 8.4*  8.6*   GFR: Estimated Creatinine Clearance: 9.4 mL/min (A) (by C-G formula based on SCr of 7.86 mg/dL (H)). Liver Function Tests: Recent Labs  Lab 01/06/19 2156 01/07/19 0827 01/08/19 0631 01/09/19 0729 01/10/19 0234  AST 17 16 12* 11* 12*  ALT 12 11 10 7 8   ALKPHOS 57 60 49 49 45  BILITOT 0.7 0.7 0.7 0.8 1.1  PROT 6.3* 6.7 5.2* 5.1* 5.4*  ALBUMIN 3.2* 3.2* 2.4*  2.4* 2.4*  2.4* 2.3*  2.3*   No results for input(s): LIPASE, AMYLASE in the  last 168 hours. No results for input(s): AMMONIA in the last 168 hours. Coagulation Profile: No results for input(s): INR, PROTIME in the last 168 hours. Cardiac Enzymes: Recent Labs  Lab 01/07/19 0827 01/07/19 1119 01/07/19 1835 01/07/19 2255 01/08/19 0631 01/09/19 0729 01/10/19 0234  CKTOTAL  --  34*  --   --  27* 33* 28*  TROPONINI 0.09* 0.07* 0.07* 0.06*  --   --  0.05*   BNP (last 3 results) No results for input(s): PROBNP in the last 8760 hours. HbA1C: No results for input(s): HGBA1C in the last 72 hours. CBG: Recent Labs  Lab 01/03/19 1201  GLUCAP 71   Lipid Profile: Recent Labs    01/10/19 0234  TRIG 112   Thyroid Function Tests: No results for input(s): TSH, T4TOTAL, FREET4, T3FREE, THYROIDAB in the last 72 hours. Anemia Panel: Recent Labs    01/07/19 1119  01/09/19 0729 01/10/19 0234  FERRITIN  --    < > 395* 503*  TIBC 249*  --   --   --   IRON 12*  --   --   --    < > = values in this interval not displayed.   Sepsis Labs: Recent Labs  Lab 01/06/19 2156 01/07/19 0827  PROCALCITON  --  <0.10  LATICACIDVEN 0.8  --     Recent Results (from the past 240 hour(s))  Blood Culture (routine x 2)     Status: None (Preliminary result)   Collection Time: 01/06/19  9:56 PM  Result Value Ref Range Status   Specimen Description RIGHT ANTECUBITAL  Final   Special Requests   Final    BOTTLES DRAWN AEROBIC AND ANAEROBIC Blood Culture adequate volume   Culture   Final    NO GROWTH 4 DAYS Performed at [facility], [address], [place], [place] [address]    Report Status PENDING  Incomplete  Blood Culture (routine x 2)     Status: None (Preliminary result)   Collection Time: 01/06/19 10:00 PM  Result Value Ref Range Status   Specimen Description LEFT ANTECUBITAL  Final   Special Requests   Final    BOTTLES DRAWN AEROBIC AND ANAEROBIC Blood Culture adequate volume   Culture   Final    NO GROWTH 4 DAYS Performed at [facility], [address],  [place], [place] [address]    Report Status PENDING  Incomplete  Respiratory Panel by PCR  Status: None   Collection Time: 01/07/19  7:20 AM  Result Value Ref Range Status   Adenovirus NOT DETECTED NOT DETECTED Final   Coronavirus 229E NOT DETECTED NOT DETECTED Final    Comment: (NOTE) The Coronavirus on the Respiratory Panel, DOES NOT test for the novel  Coronavirus (2019 nCoV)    Coronavirus HKU1 NOT DETECTED NOT DETECTED Final   Coronavirus NL63 NOT DETECTED NOT DETECTED Final   Coronavirus OC43 NOT DETECTED NOT DETECTED Final   Metapneumovirus NOT DETECTED NOT DETECTED Final   Rhinovirus / Enterovirus NOT DETECTED NOT DETECTED Final   Influenza A NOT DETECTED NOT DETECTED Final   Influenza B NOT DETECTED NOT DETECTED Final   Parainfluenza Virus 1 NOT DETECTED NOT DETECTED Final   Parainfluenza Virus 2 NOT DETECTED NOT DETECTED Final   Parainfluenza Virus 3 NOT DETECTED NOT DETECTED Final   Parainfluenza Virus 4 NOT DETECTED NOT DETECTED Final   Respiratory Syncytial Virus NOT DETECTED NOT DETECTED Final   Bordetella pertussis NOT DETECTED NOT DETECTED Final   Chlamydophila pneumoniae NOT DETECTED NOT DETECTED Final   Mycoplasma pneumoniae NOT DETECTED NOT DETECTED Final    Comment: Performed at [facility], [place] [address], [place], [place] [address]  Novel Coronavirus, NAA (hospital order; send-out to ref lab)     Status: None   Collection Time: 01/07/19  7:30 AM  Result Value Ref Range Status   SARS-CoV-2, NAA NOT DETECTED NOT DETECTED Final    Comment: Performed at [facility] Performed at [facility], [place] [address] [place], [place] [address]    Coronavirus Source NASOPHARYNGEAL  Final    Comment: Performed at [facility], [address] [place], [place] [address]         Radiology Studies: US Renal  Result Date: 01/09/2019 CLINICAL DATA:  53 year old female with a history of medical renal disease. Prior biopsy 01/03/2019 EXAM: RENAL / URINARY  TRACT ULTRASOUND COMPLETE COMPARISON:  CT 01/06/2019, ultrasound 01/04/2019 FINDINGS: Right Kidney: Length: 7.8 cm x 4.3 cm x 4.5 cm, 78 cc. No hydronephrosis. Echogenic cortex, similar to the prior. Flow confirmed in the hilum of the right kidney. Left Kidney: Length: 9.0 cm x 3.9 cm x 3.6 cm, 65 cc. No hydronephrosis. Similar echogenicity to the comparison, symmetric to the contralateral. No large perinephric fluid. Bladder: Appears normal for degree of bladder distention. Incidental right pleural effusion. IMPRESSION: No evidence of hydronephrosis. No large fluid collection adjacent to the left kidney. Right pleural effusion. Electronically Signed   By: Corrie Mckusick D.O.   On: 01/09/2019 19:48   Dg Chest Port 1 View  Result Date: 01/10/2019 CLINICAL DATA:  53 year old female with shortness of breath and cough EXAM: PORTABLE CHEST 1 VIEW COMPARISON:  01/09/2019 FINDINGS: Cardiomediastinal silhouette unchanged in size and contour. Interval improvement in aeration bilateral lungs with they will opacities at the bases of the lungs. Opacity at the right lung base and left lung base obscuring the costophrenic angles and the hemidiaphragm. Mixed interstitial and airspace opacity of the right hilar and suprahilar region, slightly improved from the prior. IMPRESSION: Improving aeration in the right lung with persistent interstitial and airspace opacity of the hilar and suprahilar region. Bilateral pleural effusions with associated atelectasis/consolidation. Electronically Signed   By: Corrie Mckusick D.O.   On: 01/10/2019 08:15   Dg Chest Port 1 View  Result Date: 01/09/2019 CLINICAL DATA:  53 year old female with shortness of breath EXAM: PORTABLE CHEST 1 VIEW COMPARISON:  01/09/2019, 01/08/2019, 01/06/2019,  CT chest 01/06/2019 FINDINGS: Cardiomediastinal silhouette is unchanged in size and contour. Heart borders partially obscured by overlying lung/pleural disease. Failed opacities at the bilateral lung bases,  with dense opacities obscuring the bilateral hemidiaphragms and heart borders. Mixed interstitial and airspace opacities of the right hilar and suprahilar region extending superiorly. No pneumothorax.  Aeration maintained in the left upper lobe. IMPRESSION: Similar appearance of the chest x-ray, with right greater than left airspace/interstitial disease and bilateral pleural effusions with associated atelectasis/consolidation. Electronically Signed   By: Corrie Mckusick D.O.   On: 01/09/2019 17:02   Dg Chest Port 1 View  Result Date: 01/09/2019 CLINICAL DATA:  Cough and shortness of breath. EXAM: PORTABLE CHEST 1 VIEW COMPARISON:  Chest x-ray from yesterday. FINDINGS: Stable cardiomediastinal silhouette. Normal pulmonary vascularity. Worsening patchy opacity in the right upper lobe. Unchanged moderate left and small right pleural effusions with left greater than right lower lobe atelectasis/consolidation. No pneumothorax. No acute osseous abnormality. IMPRESSION: 1. Worsening right upper lobe pneumonia. 2. Unchanged moderate left and small right pleural effusions with left greater than right lower lobe atelectasis/infiltrate. Electronically Signed   By: Titus Dubin M.D.   On: 01/09/2019 08:41        Scheduled Meds: . ALPRAZolam  0.5 mg Oral TID  . amLODipine  10 mg Oral Daily  . calcium acetate  1,334 mg Oral TID WC  . guaiFENesin  1,200 mg Oral BID  . ipratropium-albuterol  3 mL Nebulization QID  . isosorbide dinitrate  10 mg Oral BID  . metoprolol tartrate  25 mg Oral BID  . sertraline  100 mg Oral Daily  . sodium bicarbonate  650 mg Oral TID  . sodium chloride flush  3 mL Intravenous Q12H   Continuous Infusions: . sodium chloride    . ceFEPime (MAXIPIME) IV 1 g (01/10/19 0939)  . ferric gluconate (FERRLECIT/NULECIT) IV 125 mg (01/10/19 7903)     LOS: 3 days        Aline August, MD [facility] 01/10/2019, 10:20 AM

## 2019-01-10 NOTE — Progress Notes (Signed)
Pt wean to 7 L HFNC. Pt maintaining sats @97 %. To continue to titrate O2  To maintain Sats > 92%

## 2019-01-10 NOTE — Progress Notes (Signed)
  Echocardiogram 2D Echocardiogram has been performed.  Summer Cook 01/10/2019, 9:07 AM

## 2019-01-10 NOTE — Progress Notes (Signed)
   01/09/19 2035  Vitals  Temp 98.9 F (37.2 C)  Temp Source Oral  BP 120/82  MAP (mmHg) 94  BP Location Right Arm  BP Method Automatic  Patient Position (if appropriate) Lying  Pulse Rate 81  Pulse Rate Source Monitor  ECG Heart Rate 83  Resp (!) 35  Oxygen Therapy  SpO2 94 %  O2 Device HFNC  Heater temperature 50 F (10 C)  MEWS Score  MEWS RR 2  MEWS Pulse 0  MEWS Systolic 0  MEWS LOC 0  MEWS Temp 0  MEWS Score 2  MEWS Score Color Yellow   Summer Cook was received to 5W 36. Pt was oriented to unit and how to call for assistance. Fall education competed. Pt verbalized understanding on risks associated with falls. Minor bruises noted otherwise skin intact. To monitor and treat pt per MD and nursing orders

## 2019-01-10 NOTE — Consult Note (Signed)
Chief Complaint: Patient was seen in consultation today for renal failure  Referring Physician(s): Dr. Posey Pronto  Supervising Physician: Markus Daft  Patient Status: [facility] - In-pt  History of Present Illness: Summer Cook is a 53 y.o. female with past medical history of asthma, CAD s/p MI, GERD, stroke, DM, and ITP who was admitted to [facility] 01/07/19 with cough, fever, left flank pain, and acute renal failure.  Patient is s/p kidney biopsy 01/03/19 with moderate left extrarenal perinephric hematoma. She was admitted with shortness of breath and for acute renal failure.  She has failed to improve.  Her Covid-19 tests were negative.  She remains on 8L O2 today.  Nephrology has consulted for tunneled vs. Temporary HD catheter placement for suspected volume overload.  She has been NPO today.   Past Medical History:  Diagnosis Date  . Allergy   . Anxiety   . Asthma   . CAD (coronary artery disease), native coronary artery    a. cath 05/20/17 - 100% distal PDA --> medical mangement (no intervention done)  . Candida esophagitis ([place])   . Chest pain at rest 04/2017  . Depression   . Essential hypertension, benign   . GERD (gastroesophageal reflux disease)   . Gout   . Hemorrhoids   . 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  Last was Prednisone 101m; 2 wk in 10/2012.  She also was on Prednisone bridged with Cellcept briefly but stopped due to lack of insurance.   . Myocardial infarction ([place]   . Pulmonary embolism ([place] 04/2012  . Renal insufficiency   . Serrated adenoma of colon   . Steroid-induced diabetes ([place]   . Stroke ([place]   . Thrush, oral 05/29/2011    Past Surgical History:  Procedure Laterality Date  . BONE MARROW BIOPSY    . CARDIAC CATHETERIZATION    . CARPAL TUNNEL RELEASE    . CARPAL TUNNEL RELEASE Left 05/20/2016   Procedure: CARPAL TUNNEL RELEASE;  Surgeon:  SCarole Civil MD;  Location: [facility];  Service: Orthopedics;  Laterality: Left;  . CARPAL TUNNEL RELEASE Right 07/16/2016   Procedure: RIGHT CARPAL TUNNEL RELEASE;  Surgeon: SCarole Civil MD;  Location: [facility]S;  Service: Orthopedics;  Laterality: Right;  . CATARACT EXTRACTION    . CHOLECYSTECTOMY  2008  . COLONOSCOPY WITH ESOPHAGOGASTRODUODENOSCOPY (EGD) N/A 01/04/2013   Dr. RGala Romney esophageal plaques with +KOH, hh. Gastric antrum abnormal, bx reactive gastropathy. Anal canal hemorrhoids, colonic tics, normal TI, single polyp (sessile serrated tubular adenoma). Next TCS 12/2017  . LEFT HEART CATH AND CORONARY ANGIOGRAPHY N/A 05/20/2017   Procedure: LEFT HEART CATH AND CORONARY ANGIOGRAPHY;  Surgeon: JMartinique Peter M, MD;  [facility];  Service: Cardiovascular;  Laterality: N/A;  . 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 TEE WITHOUT CARDIOVERSION N/A 01/03/2016   Procedure: TRANSESOPHAGEAL ECHOCARDIOGRAM (TEE) WITH PROPOFOL;  Surgeon: SSatira Sark MD;  Location: [facility] SUITE;  Service: Cardiovascular;  Laterality: N/A;    Allergies: Brintellix [vortioxetine]; Cymbalta [duloxetine hcl]; Yellow jacket venom; Adhesive [tape]; and Penicillins  Medications: Prior to Admission medications   Medication Sig Start Date End Date Taking? Authorizing Provider  acetaminophen (TYLENOL) 500 MG tablet Take 1,000-1,500 mg by mouth every 6 (six) hours as needed for headache (pain).   Yes [provider]  albuterol (PROVENTIL HFA;VENTOLIN HFA) 108 (90 Base) MCG/ACT inhaler Inhale 2 puffs into the lungs every 6 (six)  hours as needed for wheezing or shortness of breath.   Yes [provider]  amLODipine (NORVASC) 10 MG tablet Take 1 tablet (10 mg total) by mouth daily for 30 days. 01/05/19 02/04/19 Yes Shah, Pratik D, DO  aspirin EC 81 MG EC tablet Take 1 tablet (81 mg total) by mouth daily. 05/23/17  Yes Eileen Stanford, PA-C  diclofenac sodium (VOLTAREN) 1 % GEL Apply 2 g  topically 4 (four) times daily. Patient taking differently: Apply 2 g topically 4 (four) times daily as needed (pain).  06/06/17  Yes Rizwan, Eunice Blase, MD  ipratropium-albuterol (DUONEB) 0.5-2.5 (3) MG/3ML SOLN Take 3 mLs by nebulization 3 (three) times daily as needed (shorntess of breath/wheezing). Patient taking differently: Take 3 mLs by nebulization 3 (three) times daily as needed (shortness of breath/wheezing).  02/18/15  Yes Kathie Dike, MD  isosorbide dinitrate (ISORDIL) 10 MG tablet Take 1 tablet (10 mg total) by mouth 2 (two) times daily for 30 days. 01/04/19 02/03/19 Yes Shah, Pratik D, DO  metoprolol tartrate (LOPRESSOR) 25 MG tablet Take 1 tablet (25 mg total) by mouth 2 (two) times daily for 30 days. 01/04/19 02/03/19 Yes Shah, Pratik D, DO  nitroGLYCERIN (NITROSTAT) 0.4 MG SL tablet Place 1 tablet (0.4 mg total) under the tongue every 5 (five) minutes x 3 doses as needed for chest pain. 05/25/17  Yes Carlota Raspberry, Tiffany, PA-C  sertraline (ZOLOFT) 100 MG tablet Take 100 mg by mouth daily.  10/11/18  Yes [provider]  sodium bicarbonate 650 MG tablet Take 1 tablet (650 mg total) by mouth 2 (two) times daily for 30 days. 01/04/19 02/03/19 Yes Shah, Pratik D, DO  venlafaxine (EFFEXOR) 37.5 MG tablet Take 37.5 mg by mouth 2 (two) times daily. 12/22/18  Yes [provider]  prochlorperazine (COMPAZINE) 10 MG tablet Take 10 mg by mouth as needed. For nausea   12/18/11  [provider]     Family History  Problem Relation Age of Onset  . Cervical cancer Mother   . Lung cancer Father   . Colon polyps Father        ???  . Pneumonia Brother   . Colon cancer Paternal Grandmother 88  . AAA (abdominal aortic aneurysm) Paternal Grandmother   . Breast cancer Paternal Grandmother   . Colon cancer Paternal Grandfather 31  . Barrett's esophagus Paternal Aunt   . Stomach cancer Neg Hx   . Pancreatic cancer Neg Hx     Social History   Socioeconomic History  . Marital status:  Divorced    Spouse name: Not on file  . Number of children: 1  . Years of education: Not on file  . 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   Social Needs  . Financial resource strain: Not hard at all  . Food insecurity:    Worry: Sometimes true    Inability: Sometimes true  . Transportation needs:    Medical: No    Non-medical: No  Tobacco Use  . Smoking status: Former Smoker    Years: 0.00    Types: Cigarettes    Last attempt to quit: 02/14/2009    Years since quitting: 9.9  . Smokeless tobacco: Never Used  Substance and Sexual Activity  . Alcohol use: No    Alcohol/week: 0.0 standard drinks  . Drug use: No  . Sexual activity: Never  Lifestyle  . Physical activity:  Days per week: 2 days    Minutes per session: 30 min  . Stress: Rather much  Relationships  . Social connections:    Talks on phone: More than three times a week    Gets together: More than three times a week    Attends religious service: Never    Active member of club or organization: No    Attends meetings of clubs or organizations: Never    Relationship status: Divorced  Other Topics Concern  . Not on file  Social History Narrative   Lives with her daughter and aunt in a one story home.  Has 1 daughter.  On disability.  Did work as a Optometrist and bus Geophysicist/field seismologist.  Education: some college.      Review of Systems: A 12 point ROS discussed and pertinent positives are indicated in the HPI above.  All other systems are negative.  Review of Systems  Constitutional: Negative for fatigue and fever.  Respiratory: Positive for cough (on admission) and shortness of breath (on admission).   Gastrointestinal: Negative for abdominal pain.  Musculoskeletal: Negative for back pain.  Psychiatric/Behavioral: Negative for behavioral problems and confusion.    Vital Signs: BP 108/76 (BP Location: Left Arm)   Pulse 75    Temp 97.7 F (36.5 C) (Axillary)   Resp (!) 26   Ht _0  (1.651 m)   Wt 207 lb 14.3 oz (94.3 kg)   LMP 08/13/2011   SpO2 94%   BMI 34.60 kg/m   Physical Exam Vitals signs and nursing note reviewed.  Constitutional:      General: She is not in acute distress.    Appearance: She is ill-appearing.  HENT:     Mouth/Throat:     Mouth: Mucous membranes are dry.     Pharynx: Oropharynx is clear.  Cardiovascular:     Rate and Rhythm: Normal rate and regular rhythm.  Pulmonary:     Effort: No respiratory distress.     Comments: Diminished breath sounds in bilateral bases Neurological:     General: No focal deficit present.     Mental Status: She is alert and oriented to person, place, and time. Mental status is at baseline.  Psychiatric:        Mood and Affect: Mood normal.        Behavior: Behavior normal.      MD Evaluation Airway: WNL Heart: WNL Abdomen: WNL Chest/ Lungs: WNL ASA  Classification: 3 Mallampati/Airway Score: Two   Imaging: Ct Chest Wo Contrast  Result Date: 01/07/2019 CLINICAL DATA:  Pyelonephritis, complicated; Acute resp illness, > 67 years old Cough, persistent. Left flank pain. Patient reports renal biopsy 3 days ago. Fever, shortness of breath and flu-like symptoms. EXAM: CT CHEST, ABDOMEN AND PELVIS WITHOUT CONTRAST TECHNIQUE: Multidetector CT imaging of the chest, abdomen and pelvis was performed following the standard protocol without IV contrast. COMPARISON:  Chest radiograph performed concurrently. Abdominal CT 12/31/2018 FINDINGS: CT CHEST FINDINGS Cardiovascular: Mild multi chamber cardiomegaly. Trace pericardial fluid. Thoracic aorta is normal in caliber. Mediastinum/Nodes: Multiple prominent mediastinal nodes. Limited assessment for hilar adenopathy given lack of IV contrast. Esophagus is decompressed. No visualized thyroid nodule. Lungs/Pleura: Small to moderate bilateral pleural effusions. Associated compressive atelectasis. There is smooth  septal thickening consistent with pulmonary edema. Multifocal patchy ground-glass opacities, asymmetric throughout both lungs. Trachea mainstem bronchi are patent. Musculoskeletal: Chronic mild T10 superior endplate compression fracture. There are no acute or suspicious osseous abnormalities. CT ABDOMEN PELVIS FINDINGS Hepatobiliary: Elongated left  lobe of the liver. No focal hepatic lesion on noncontrast exam. Insert: Pancreas: No ductal dilatation or inflammation. Spleen: Splenectomy. Adrenals/Urinary Tract: Normal adrenal glands. High-density left perinephric collection spanning 2.9 cm in depth and 9.8 cm in length most consistent with hematoma in the setting of recent kidney biopsy. Small foci of gas within the collection inferiorly. No evidence of intrarenal fluid collection. No significant mass effect on the left kidney. No hydronephrosis. No right hydronephrosis. Both ureters are decompressed. Urinary bladder is minimally distended. Questionable bladder wall thickening. Stomach/Bowel: Bowel evaluation is limited in the absence of enteric contrast. Ingested material in the stomach. No evidence of bowel wall thickening, inflammatory change, or obstruction. The duodenal jejunal junction is not well-defined on this noncontrast exam. Colonic diverticulosis without diverticulitis. Normal appendix tentatively identified. Vascular/Lymphatic: Normal caliber thoracic aorta. Tab attic left common iliac artery at 19 mm, unchanged allowing for differences in caliper placement. No definite enlarged lymph nodes in the abdomen or pelvis. Reproductive: Uterus and bilateral adnexa are unremarkable. Other: Subcutaneous flank soft tissue edema, left greater than right. Small left lateral lumbar hernia contains only fat, unchanged. No ascites or hemoperitoneum. Minimal edema in the presacral space. Musculoskeletal: Prominent Schmorl's node loss of height T12, unchanged. Degenerative change in the lumbar spine. Avascular necrosis  of both femoral heads without collapse. IMPRESSION: 1. Cardiomegaly with bilateral pleural effusions. Smooth septal thickening consistent with pulmonary edema. 2. There are patchy bilateral ground-glass opacities in both lungs that are nonspecific in the setting, this may represent alveolar edema infectious or inflammatory etiologies. Imaging features are atypical or on commonly reported with COVID-19 or viral pneumonia. Alternative diagnosis should be considered. 3. Recent left renal biopsy with moderate-sized left extrarenal perinephric hematoma. Small foci of air within the hematoma is likely related to recent biopsy, however raises the possibility of superimposed infection. 4. Colonic diverticulosis without diverticulitis. Electronically Signed   By: Keith Rake M.D.   On: 01/07/2019 00:09   US Renal  Result Date: 01/09/2019 CLINICAL DATA:  53 year old female with a history of medical renal disease. Prior biopsy 01/03/2019 EXAM: RENAL / URINARY TRACT ULTRASOUND COMPLETE COMPARISON:  CT 01/06/2019, ultrasound 01/04/2019 FINDINGS: Right Kidney: Length: 7.8 cm x 4.3 cm x 4.5 cm, 78 cc. No hydronephrosis. Echogenic cortex, similar to the prior. Flow confirmed in the hilum of the right kidney. Left Kidney: Length: 9.0 cm x 3.9 cm x 3.6 cm, 65 cc. No hydronephrosis. Similar echogenicity to the comparison, symmetric to the contralateral. No large perinephric fluid. Bladder: Appears normal for degree of bladder distention. Incidental right pleural effusion. IMPRESSION: No evidence of hydronephrosis. No large fluid collection adjacent to the left kidney. Right pleural effusion. Electronically Signed   By: Corrie Mckusick D.O.   On: 01/09/2019 19:48   US Renal  Result Date: 01/04/2019 CLINICAL DATA:  53 year old female with anemia and a prior left kidney biopsy EXAM: RENAL / URINARY TRACT ULTRASOUND COMPLETE COMPARISON:  12/31/2018, 01/03/2019 FINDINGS: Right Kidney: Length: 9.8 cm x 4.1 cm x 4.3 cm, 91 cc. No  hydronephrosis. Flow confirmed in the hilum of the right kidney. Left Kidney: Length: 8.8 cm x 3.8 cm x 5.1 cm, 91 cc. No hydronephrosis. Flow confirmed in the hilum of the right kidney. There is no fluid collection on the lateral aspect of the left kidney. Lower pole not well visualized. Bladder: Urinary bladder decompressed IMPRESSION: No evidence of hydronephrosis of the left or right kidney. No fluid collection associated with the left kidney. Electronically Signed   By: York Cerise  Earleen Newport D.O.   On: 01/04/2019 13:37   US Renal  Result Date: 12/31/2018 CLINICAL DATA:  Acute kidney injury EXAM: RENAL / URINARY TRACT ULTRASOUND COMPLETE COMPARISON:  Abdominal CT 12/31/2018 FINDINGS: Right Kidney: Renal measurements: 10 x 4 x 4 cm = volume: 91 mL. Increased cortical echogenicity with prominent corticomedullary differentiation. No hydronephrosis or mass. Left Kidney: Renal measurements: 9 x 3.9 x 4.4 cm = volume: 93 mL. Increased cortical echogenicity. No hydronephrosis. Bladder: Appears normal for degree of bladder distention. IMPRESSION: Medical renal disease without atrophy or hydronephrosis. Electronically Signed   By: Monte Fantasia M.D.   On: 12/31/2018 11:08   Dg Chest Port 1 View  Result Date: 01/10/2019 CLINICAL DATA:  53 year old female with shortness of breath and cough EXAM: PORTABLE CHEST 1 VIEW COMPARISON:  01/09/2019 FINDINGS: Cardiomediastinal silhouette unchanged in size and contour. Interval improvement in aeration bilateral lungs with they will opacities at the bases of the lungs. Opacity at the right lung base and left lung base obscuring the costophrenic angles and the hemidiaphragm. Mixed interstitial and airspace opacity of the right hilar and suprahilar region, slightly improved from the prior. IMPRESSION: Improving aeration in the right lung with persistent interstitial and airspace opacity of the hilar and suprahilar region. Bilateral pleural effusions with associated  atelectasis/consolidation. Electronically Signed   By: Corrie Mckusick D.O.   On: 01/10/2019 08:15   Dg Chest Port 1 View  Result Date: 01/09/2019 CLINICAL DATA:  53 year old female with shortness of breath EXAM: PORTABLE CHEST 1 VIEW COMPARISON:  01/09/2019, 01/08/2019, 01/06/2019, CT chest 01/06/2019 FINDINGS: Cardiomediastinal silhouette is unchanged in size and contour. Heart borders partially obscured by overlying lung/pleural disease. Failed opacities at the bilateral lung bases, with dense opacities obscuring the bilateral hemidiaphragms and heart borders. Mixed interstitial and airspace opacities of the right hilar and suprahilar region extending superiorly. No pneumothorax.  Aeration maintained in the left upper lobe. IMPRESSION: Similar appearance of the chest x-ray, with right greater than left airspace/interstitial disease and bilateral pleural effusions with associated atelectasis/consolidation. Electronically Signed   By: Corrie Mckusick D.O.   On: 01/09/2019 17:02   Dg Chest Port 1 View  Result Date: 01/09/2019 CLINICAL DATA:  Cough and shortness of breath. EXAM: PORTABLE CHEST 1 VIEW COMPARISON:  Chest x-ray from yesterday. FINDINGS: Stable cardiomediastinal silhouette. Normal pulmonary vascularity. Worsening patchy opacity in the right upper lobe. Unchanged moderate left and small right pleural effusions with left greater than right lower lobe atelectasis/consolidation. No pneumothorax. No acute osseous abnormality. IMPRESSION: 1. Worsening right upper lobe pneumonia. 2. Unchanged moderate left and small right pleural effusions with left greater than right lower lobe atelectasis/infiltrate. Electronically Signed   By: Titus Dubin M.D.   On: 01/09/2019 08:41   Dg Chest Port 1 View  Result Date: 01/08/2019 CLINICAL DATA:  Cough, chest congestion and shortness of breath. Follow-up CHF versus pneumonia. EXAM: PORTABLE CHEST 1 VIEW COMPARISON:  CT chest and chest x-ray 01/06/2019 and earlier.  FINDINGS: Cardiac silhouette mildly enlarged, unchanged. Pulmonary vascularity now normal. Developing patchy airspace opacities in the RIGHT UPPER LOBE. Moderate to large BILATERAL pleural effusions, increased in size on the RIGHT and unchanged on the LEFT, with associated dense consolidation in the lower lobes. IMPRESSION: 1. Developing pneumonia involving the RIGHT upper lobe. 2. Moderate to large BILATERAL pleural effusions, increased in size on the RIGHT and unchanged on the LEFT. Associated dense BILATERAL lower lobe atelectasis and/or pneumonia. 3. Stable mild cardiomegaly without pulmonary edema currently as the pulmonary vascularity is  now normal. Electronically Signed   By: Evangeline Dakin M.D.   On: 01/08/2019 11:00   Dg Chest Port 1 View  Result Date: 01/06/2019 CLINICAL DATA:  Fever. EXAM: PORTABLE CHEST 1 VIEW COMPARISON:  Radiograph 06/29/2018. FINDINGS: Cardiomegaly, new from prior exam. Bilateral pleural effusions, also new. Vascular congestion without overt pulmonary edema. Bibasilar opacities associated with pleural effusions typically atelectasis. No pneumothorax. IMPRESSION: Cardiomegaly with vascular congestion and small pleural effusions. Findings consistent with CHF. Electronically Signed   By: Keith Rake M.D.   On: 01/06/2019 23:26   Ct Renal Stone Study  Result Date: 01/07/2019 CLINICAL DATA:  Pyelonephritis, complicated; Acute resp illness, > 59 years old Cough, persistent. Left flank pain. Patient reports renal biopsy 3 days ago. Fever, shortness of breath and flu-like symptoms. EXAM: CT CHEST, ABDOMEN AND PELVIS WITHOUT CONTRAST TECHNIQUE: Multidetector CT imaging of the chest, abdomen and pelvis was performed following the standard protocol without IV contrast. COMPARISON:  Chest radiograph performed concurrently. Abdominal CT 12/31/2018 FINDINGS: CT CHEST FINDINGS Cardiovascular: Mild multi chamber cardiomegaly. Trace pericardial fluid. Thoracic aorta is normal in  caliber. Mediastinum/Nodes: Multiple prominent mediastinal nodes. Limited assessment for hilar adenopathy given lack of IV contrast. Esophagus is decompressed. No visualized thyroid nodule. Lungs/Pleura: Small to moderate bilateral pleural effusions. Associated compressive atelectasis. There is smooth septal thickening consistent with pulmonary edema. Multifocal patchy ground-glass opacities, asymmetric throughout both lungs. Trachea mainstem bronchi are patent. Musculoskeletal: Chronic mild T10 superior endplate compression fracture. There are no acute or suspicious osseous abnormalities. CT ABDOMEN PELVIS FINDINGS Hepatobiliary: Elongated left lobe of the liver. No focal hepatic lesion on noncontrast exam. Insert: Pancreas: No ductal dilatation or inflammation. Spleen: Splenectomy. Adrenals/Urinary Tract: Normal adrenal glands. High-density left perinephric collection spanning 2.9 cm in depth and 9.8 cm in length most consistent with hematoma in the setting of recent kidney biopsy. Small foci of gas within the collection inferiorly. No evidence of intrarenal fluid collection. No significant mass effect on the left kidney. No hydronephrosis. No right hydronephrosis. Both ureters are decompressed. Urinary bladder is minimally distended. Questionable bladder wall thickening. Stomach/Bowel: Bowel evaluation is limited in the absence of enteric contrast. Ingested material in the stomach. No evidence of bowel wall thickening, inflammatory change, or obstruction. The duodenal jejunal junction is not well-defined on this noncontrast exam. Colonic diverticulosis without diverticulitis. Normal appendix tentatively identified. Vascular/Lymphatic: Normal caliber thoracic aorta. Tab attic left common iliac artery at 19 mm, unchanged allowing for differences in caliper placement. No definite enlarged lymph nodes in the abdomen or pelvis. Reproductive: Uterus and bilateral adnexa are unremarkable. Other: Subcutaneous flank soft  tissue edema, left greater than right. Small left lateral lumbar hernia contains only fat, unchanged. No ascites or hemoperitoneum. Minimal edema in the presacral space. Musculoskeletal: Prominent Schmorl's node loss of height T12, unchanged. Degenerative change in the lumbar spine. 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 IMPRESSION: 1. Cardiomegaly with bilateral pleural effusions. Smooth septal thickening consistent with pulmonary edema. 2. There are patchy bilateral ground-glass opacities in both lungs that are nonspecific in the setting, this may represent alveolar edema infectious or inflammatory etiologies. Imaging features are atypical or on commonly reported with COVID-19 or viral pneumonia. Alternative diagnosis should be considered. 3. Recent left renal biopsy with moderate-sized left extrarenal perinephric hematoma. Small foci of air within the hematoma is likely related to recent biopsy, however raises the possibility of superimposed infection. 4. Colonic diverticulosis without diverticulitis. Electronically Signed   By: Keith Rake M.D.   On: 01/07/2019 00:09  Ct Renal Stone Study  Result Date: 12/31/2018 CLINICAL DATA:  Dysuria. Recent diagnosis of UTI. Fever. Vomiting. EXAM: CT ABDOMEN AND PELVIS WITHOUT CONTRAST TECHNIQUE: Multidetector CT imaging of the abdomen and pelvis was performed following the standard protocol without IV contrast. COMPARISON:  CT scan Feb 12, 2018. FINDINGS: Lower chest: Bilateral dependent atelectasis. The lower chest is otherwise unremarkable. Hepatobiliary: Previous cholecystectomy. The left hepatic lobe is hypertrophied but stable. No liver masses are noted. Pancreas: Unremarkable. No pancreatic ductal dilatation or surrounding inflammatory changes. Spleen: Previous splenectomy. Adrenals/Urinary Tract: Adrenal glands are normal. No renal stones, masses, or hydronephrosis. No ureteral stones. The bladder is unremarkable. Stomach/Bowel: The  stomach and small bowel are normal. Colonic diverticulosis is seen without diverticulitis. The remainder of the colon is normal. The visualized appendix is unremarkable. Vascular/Lymphatic: The abdominal aorta is unremarkable. 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 No adenopathy. Reproductive: Uterus and bilateral adnexa are unremarkable. Other: No abdominal wall hernia or abnormality. No abdominopelvic ascites. Musculoskeletal: Anterior wedging of T10 and T12 is stable. No changes in the visualized bones. IMPRESSION: 1. No cause for the patient's symptoms identified. 2. Colonic diverticulosis without diverticulitis. 3. 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 Electronically Signed   By: Dorise Bullion III M.D   On: 12/31/2018 00:24   US Biopsy (kidney)  Result Date: 01/03/2019 CLINICAL DATA:  Acute renal failure of unknown etiology EXAM: ULTRASOUND GUIDED RENAL CORE BIOPSY COMPARISON:  None TECHNIQUE: The procedure, risks (including but not limited to bleeding, infection, organ damage ), benefits, and alternatives were explained to the patient. Questions regarding the procedure were encouraged and answered. The patient understands and consents to the procedure. Survey ultrasound was performed and an appropriate skin entry site was localized. Site was marked, prepped with Betadine, draped in usual sterile fashion, infiltrated locally with 1% lidocaine. Intravenous Fentanyl 7mg and Versed 142mwere administered as conscious sedation during continuous monitoring of the patient's level of consciousness and physiological / cardiorespiratory status by the radiology RN, with a total moderate sedation time of 12 minutes. Under real time ultrasound guidance, a 15 gauge trocar needle was advanced to the margin of the lower pole of the left kidney for 3 coaxial 16 gauge core biopsy needle passes. The core  samples were submitted to pathology. The patient tolerated procedure well. COMPLICATIONS: None immediate IMPRESSION: 1. Technically successful ultrasound-guided core renal biopsy , left lower pole. Electronically Signed   By: D Lucrezia Europe.D.   On: 01/03/2019 13:34    Labs:  CBC: Recent Labs    01/07/19 0827 01/08/19 0631 01/09/19 0729 01/10/19 0234  WBC 8.8 10.0 9.6 10.2  HGB 8.8* 7.4* 6.9* 7.8*  HCT 28.6* 23.4* 22.5* 24.6*  PLT 190 168 149* 153    COAGS: Recent Labs    01/01/19 1227  INR 1.1  APTT 34    BMP: Recent Labs    01/07/19 0827 01/08/19 0631 01/09/19 0729 01/10/19 0234  NA 136 135 134*  134* 134*  134*  K 4.1 4.4 4.8  4.8 4.3  4.4  CL 107 105 106  105 106  106  CO2 17* 15* 15*  17* 14*  15*  GLUCOSE 91 95 88  89 89  92  BUN 71* 70* 72*  73* 81*  81*  CALCIUM 7.8* 7.7* 8.0*  8.0* 8.1*  8.1*  CREATININE 6.82* 7.26* 7.50*  7.76* 7.76*  7.86*  GFRNONAA 6* 6* 6*  5* 5*  5*  GFRAA 7* 7* 7*  6* 6*  6*    LIVER FUNCTION TESTS: Recent Labs    01/07/19 0827 01/08/19 0631 01/09/19 0729 01/10/19 0234  BILITOT 0.7 0.7 0.8 1.1  AST 16 12* 11* 12*  ALT _0 ALKPHOS 60 49 49 45  PROT 6.7 5.2* 5.1* 5.4*  ALBUMIN 3.2* 2.4*  2.4* 2.4*  2.4* 2.3*  2.3*    TUMOR MARKERS: No results for input(s): AFPTM, CEA, CA199, CHROMGRNA in the last 8760 hours.  Assessment and Plan: Renal failure Patient with SCr 6.82 and suspected volume overload.  Nephrology following and has requested dialysis catheter placement for dialysis initiation.   Plan for tunneled catheter placement if patient can tolerate procedure.  Currently on 8L O2.   Risks and benefits discussed with the patient including, but not limited to bleeding, infection, vascular injury, pneumothorax which may require chest tube placement, air embolism or even death  All of the patient's questions were answered, patient is agreeable to proceed. Consent signed and in chart.  Thank  you for this interesting consult.  I greatly enjoyed meeting Summer Cook and look forward to participating in their care.  A copy of this report was sent to the requesting provider on this date.  Electronically Signed: Docia Barrier, PA 01/10/2019, 1:30 PM   I spent a total of 40 Minutes    in face to face in clinical consultation, greater than 50% of which was counseling/coordinating care for renal failure.

## 2019-01-10 NOTE — Procedures (Addendum)
Interventional Radiology Procedure:   Indications: Acute renal failure and needs dialysis  Procedure: Tunneled dialysis catheter placement  Findings: Patent right IJ. 23 cm Palindrome catheter tip in the upper RA.  Complications: None     EBL: less than 10 ml  Plan: Dialysis catheter is ready to use.      Lilah Mijangos R. Anselm Pancoast, MD  Pager: [phone]

## 2019-01-10 NOTE — Sedation Documentation (Addendum)
While prepping patient for the procedure, patient sats dropped to 80 on 8L [place]. Patient placed on NRB @15L  by this RN. MD notified.Patient remains on NRB 15L for case. Floor RN notified. No sedation or narcotics to be given for case.

## 2019-01-10 NOTE — Progress Notes (Signed)
Pt transporting to dialysis by transporter with PC monitor. Notified telemetry pt going to dialysis. Will continue to monitor pt. Ranelle Oyster, RN

## 2019-01-10 NOTE — Plan of Care (Signed)
  Problem: Health Behavior/Discharge Planning: Goal: Ability to manage health-related needs will improve Outcome: Progressing   Problem: Clinical Measurements: Goal: Ability to maintain clinical measurements within normal limits will improve Outcome: Progressing Goal: Will remain free from infection Outcome: Progressing Goal: Diagnostic test results will improve Outcome: Progressing Goal: Respiratory complications will improve Outcome: Progressing Goal: Cardiovascular complication will be avoided Outcome: Progressing   Problem: Activity: Goal: Risk for activity intolerance will decrease Outcome: Progressing   Problem: Nutrition: Goal: Adequate nutrition will be maintained Outcome: Progressing   Problem: Coping: Goal: Level of anxiety will decrease Outcome: Progressing   Problem: Pain Managment: Goal: General experience of comfort will improve Outcome: Progressing   Problem: Safety: Goal: Ability to remain free from injury will improve Outcome: Progressing   Problem: Skin Integrity: Goal: Risk for impaired skin integrity will decrease Outcome: Progressing   

## 2019-01-10 NOTE — Progress Notes (Signed)
Patient ID: Summer Cook, female   DOB: 04/30/1966, 53 y.o.   MRN: [id number] [facility] Progress Note   Assessment/ Plan:   1. Acute kidney Injury: Nonoliguric and labs pending from this morning.  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  At this time, awaiting assistance from radiology to place a dialysis catheter to begin hemodialysis for extracorporeal volume unloading/clearance of azotemia.   2.  Acute hypoxic respiratory failure: Cleared by pulmonology-low pretest clinical suspicion for COVID-19 with negative testing. 3.  Left extrarenal perinephric hematoma: Status post renal biopsy; hemodynamically stable with downtrending hemoglobin/hematocrit.  Suspected that this might have converted to an abscess however, she is afebrile and nonseptic. 4.  Hyperphosphatemia: Secondary to advancing chronic kidney disease and acute kidney injury.  Continue calcium acetate for phosphorus control. 5.  Gap/non-gap metabolic acidosis: Secondary to acute kidney injury/advancing chronic kidney disease.  On sodium bicarbonate supplement-anticipate ability to discontinue this when she is started on dialysis.  Subjective:   Events from overnight noted-developed worsening shortness of breath after blood transfusion yesterday for which we attempted furosemide-limited response.  Transferred to 30 W. unit.   Objective:   BP 109/76 (BP Location: Left Arm)   Pulse 67   Temp 97.7 F (36.5 C) (Axillary)   Resp 17   Ht 5\' 5"  (1.651 m)   Wt 94.3 kg   LMP 08/13/2011   SpO2 97%   BMI 34.60 kg/m   Intake/Output Summary (Last 24 hours) at 01/10/2019 1035 Last data filed at 01/10/2019 0700 Gross per 24 hour  Intake 490 ml  Output -  Net 490 ml   Weight change:   Physical Exam: Gen: On oxygen via nasal cannula,  appears fatigued CVS: Regular rhythm, normal rate, S1 and S2 normal Resp: Decreased breath sounds of bilateral bases, no rales/rhonchi Abd: Soft, flat, nontender Ext: Trace lower extremity edema with SCDs  Imaging: US Renal  Result Date: 01/09/2019 CLINICAL DATA:  53 year old female with a history of medical renal disease. Prior biopsy 01/03/2019 EXAM: RENAL / URINARY TRACT ULTRASOUND COMPLETE COMPARISON:  CT 01/06/2019, ultrasound 01/04/2019 FINDINGS: Right Kidney: Length: 7.8 cm x 4.3 cm x 4.5 cm, 78 cc. No hydronephrosis. Echogenic cortex, similar to the prior. Flow confirmed in the hilum of the right kidney. Left Kidney: Length: 9.0 cm x 3.9 cm x 3.6 cm, 65 cc. No hydronephrosis. Similar echogenicity to the comparison, symmetric to the contralateral. No large perinephric fluid. Bladder: Appears normal for degree of bladder distention. Incidental right pleural effusion. IMPRESSION: No evidence of hydronephrosis. No large fluid collection adjacent to the left kidney. Right pleural effusion. Electronically Signed   By: Corrie Mckusick D.O.   On: 01/09/2019 19:48   Dg Chest Port 1 View  Result Date: 01/10/2019 CLINICAL DATA:  53 year old female with shortness of breath and cough EXAM: PORTABLE CHEST 1 VIEW COMPARISON:  01/09/2019 FINDINGS: Cardiomediastinal silhouette unchanged in size and contour. Interval improvement in aeration bilateral lungs with they will opacities at the bases of the lungs. Opacity at the right lung base and left lung base obscuring the costophrenic angles and the hemidiaphragm. Mixed interstitial and airspace opacity of the right hilar and suprahilar region, slightly improved from the prior. IMPRESSION: Improving aeration in the right lung with persistent interstitial and airspace opacity of the hilar and suprahilar region. Bilateral pleural effusions with associated atelectasis/consolidation. Electronically Signed  By: Corrie Mckusick D.O.   On: 01/10/2019 08:15   Dg Chest Port  1 View  Result Date: 01/09/2019 CLINICAL DATA:  53 year old female with shortness of breath EXAM: PORTABLE CHEST 1 VIEW COMPARISON:  01/09/2019, 01/08/2019, 01/06/2019, CT chest 01/06/2019 FINDINGS: Cardiomediastinal silhouette is unchanged in size and contour. Heart borders partially obscured by overlying lung/pleural disease. Failed opacities at the bilateral lung bases, with dense opacities obscuring the bilateral hemidiaphragms and heart borders. Mixed interstitial and airspace opacities of the right hilar and suprahilar region extending superiorly. No pneumothorax.  Aeration maintained in the left upper lobe. IMPRESSION: Similar appearance of the chest x-ray, with right greater than left airspace/interstitial disease and bilateral pleural effusions with associated atelectasis/consolidation. Electronically Signed   By: Corrie Mckusick D.O.   On: 01/09/2019 17:02   Dg Chest Port 1 View  Result Date: 01/09/2019 CLINICAL DATA:  Cough and shortness of breath. EXAM: PORTABLE CHEST 1 VIEW COMPARISON:  Chest x-ray from yesterday. FINDINGS: Stable cardiomediastinal silhouette. Normal pulmonary vascularity. Worsening patchy opacity in the right upper lobe. Unchanged moderate left and small right pleural effusions with left greater than right lower lobe atelectasis/consolidation. No pneumothorax. No acute osseous abnormality. IMPRESSION: 1. Worsening right upper lobe pneumonia. 2. Unchanged moderate left and small right pleural effusions with left greater than right lower lobe atelectasis/infiltrate. Electronically Signed   By: Titus Dubin M.D.   On: 01/09/2019 08:41    Labs: BMET Recent Labs  Lab 01/04/19 0607 01/06/19 2156 01/07/19 0827 01/08/19 0631 01/09/19 0729 01/10/19 0234  NA 140 134* 136 135 134*  134* 134*  134*  K 3.9 4.1 4.1 4.4 4.8  4.8 4.3  4.4  CL 113* 105 107 105 106  105 106  106  CO2 16* 17* 17* 15* 15*  17* 14*  15*  GLUCOSE 94 126* 91 95 88  89 89  92  BUN 69* 74*  71* 70* 72*  73* 81*  81*  CREATININE 6.80* 6.93* 6.82* 7.26* 7.50*  7.76* 7.76*  7.86*  CALCIUM 7.7* 7.7* 7.8* 7.7* 8.0*  8.0* 8.1*  8.1*  PHOS 6.8*  --   --  7.4* 8.2*  8.2* 8.4*  8.6*   CBC Recent Labs  Lab 01/06/19 2156 01/07/19 0827 01/08/19 0631 01/09/19 0729 01/10/19 0234  WBC 7.8 8.8 10.0 9.6 10.2  NEUTROABS 4.6 5.9  --  5.9 6.6  HGB 8.0* 8.8* 7.4* 6.9* 7.8*  HCT 25.6* 28.6* 23.4* 22.5* 24.6*  MCV 90.5 91.7 90.0 89.6 89.1  PLT 188 190 168 149* 153    Medications:    . ALPRAZolam  0.5 mg Oral TID  . amLODipine  10 mg Oral Daily  . calcium acetate  1,334 mg Oral TID WC  . guaiFENesin  1,200 mg Oral BID  . ipratropium-albuterol  3 mL Nebulization QID  . isosorbide dinitrate  10 mg Oral BID  . metoprolol tartrate  25 mg Oral BID  . sertraline  100 mg Oral Daily  . sodium bicarbonate  650 mg Oral TID  . sodium chloride flush  3 mL Intravenous Q12H   Elmarie Shiley, MD 01/10/2019, 10:35 AM

## 2019-01-10 NOTE — Progress Notes (Signed)
CRITICAL VALUE ALERT  Critical Value:  Troponin = 0.05  Date & Time Notied: 04/14 0440  Provider Notified:  Lamar Blinks NP  Orders Received/Actions taken: Trending down. To continue to Trend

## 2019-01-10 NOTE — Progress Notes (Signed)
Bladder scan done 458ml, I&O cath results 500 ml patient tolerated well. Will continue to monitor.

## 2019-01-11 DIAGNOSIS — N179 Acute kidney failure, unspecified: Secondary | ICD-10-CM | POA: Diagnosis present

## 2019-01-11 DIAGNOSIS — I25119 Atherosclerotic heart disease of native coronary artery with unspecified angina pectoris: Secondary | ICD-10-CM

## 2019-01-11 DIAGNOSIS — R509 Fever, unspecified: Secondary | ICD-10-CM

## 2019-01-11 DIAGNOSIS — N185 Chronic kidney disease, stage 5: Secondary | ICD-10-CM

## 2019-01-11 DIAGNOSIS — I10 Essential (primary) hypertension: Secondary | ICD-10-CM

## 2019-01-11 DIAGNOSIS — I2510 Atherosclerotic heart disease of native coronary artery without angina pectoris: Secondary | ICD-10-CM

## 2019-01-11 DIAGNOSIS — I5023 Acute on chronic systolic (congestive) heart failure: Secondary | ICD-10-CM | POA: Clinically undetermined

## 2019-01-11 DIAGNOSIS — D509 Iron deficiency anemia, unspecified: Secondary | ICD-10-CM

## 2019-01-11 DIAGNOSIS — I5041 Acute combined systolic (congestive) and diastolic (congestive) heart failure: Secondary | ICD-10-CM

## 2019-01-11 DIAGNOSIS — N183 Chronic kidney disease, stage 3 (moderate): Secondary | ICD-10-CM

## 2019-01-11 DIAGNOSIS — S37012A Minor contusion of left kidney, initial encounter: Secondary | ICD-10-CM

## 2019-01-11 LAB — CBC WITH DIFFERENTIAL/PLATELET
Abs Immature Granulocytes: 0.05 10*3/uL (ref 0.00–0.07)
Basophils Absolute: 0 10*3/uL (ref 0.0–0.1)
Basophils Relative: 0 %
Eosinophils Absolute: 0.2 10*3/uL (ref 0.0–0.5)
Eosinophils Relative: 2 %
HCT: 25.9 % — ABNORMAL LOW (ref 36.0–46.0)
Hemoglobin: 8.4 g/dL — ABNORMAL LOW (ref 12.0–15.0)
Immature Granulocytes: 0 %
Lymphocytes Relative: 9 %
Lymphs Abs: 1 10*3/uL (ref 0.7–4.0)
MCH: 28.4 pg (ref 26.0–34.0)
MCHC: 32.4 g/dL (ref 30.0–36.0)
MCV: 87.5 fL (ref 80.0–100.0)
Monocytes Absolute: 1 10*3/uL (ref 0.1–1.0)
Monocytes Relative: 9 %
Neutro Abs: 9.1 10*3/uL — ABNORMAL HIGH (ref 1.7–7.7)
Neutrophils Relative %: 80 %
Platelets: 155 10*3/uL (ref 150–400)
RBC: 2.96 MIL/uL — ABNORMAL LOW (ref 3.87–5.11)
RDW: 14.8 % (ref 11.5–15.5)
WBC: 11.5 10*3/uL — ABNORMAL HIGH (ref 4.0–10.5)
nRBC: 0 % (ref 0.0–0.2)

## 2019-01-11 LAB — RENAL FUNCTION PANEL
Albumin: 2.4 g/dL — ABNORMAL LOW (ref 3.5–5.0)
Anion gap: 12 (ref 5–15)
BUN: 44 mg/dL — ABNORMAL HIGH (ref 6–20)
CO2: 22 mmol/L (ref 22–32)
Calcium: 8 mg/dL — ABNORMAL LOW (ref 8.9–10.3)
Chloride: 101 mmol/L (ref 98–111)
Creatinine, Ser: 5.27 mg/dL — ABNORMAL HIGH (ref 0.44–1.00)
GFR calc Af Amer: 10 mL/min — ABNORMAL LOW
GFR calc non Af Amer: 9 mL/min — ABNORMAL LOW
Glucose, Bld: 113 mg/dL — ABNORMAL HIGH (ref 70–99)
Phosphorus: 5.3 mg/dL — ABNORMAL HIGH (ref 2.5–4.6)
Potassium: 3.5 mmol/L (ref 3.5–5.1)
Sodium: 135 mmol/L (ref 135–145)

## 2019-01-11 LAB — COMPREHENSIVE METABOLIC PANEL
ALT: 9 U/L (ref 0–44)
AST: 15 U/L (ref 15–41)
Albumin: 2.4 g/dL — ABNORMAL LOW (ref 3.5–5.0)
Alkaline Phosphatase: 56 U/L (ref 38–126)
Anion gap: 13 (ref 5–15)
BUN: 44 mg/dL — ABNORMAL HIGH (ref 6–20)
CO2: 20 mmol/L — ABNORMAL LOW (ref 22–32)
Calcium: 8 mg/dL — ABNORMAL LOW (ref 8.9–10.3)
Chloride: 102 mmol/L (ref 98–111)
Creatinine, Ser: 5.31 mg/dL — ABNORMAL HIGH (ref 0.44–1.00)
GFR calc Af Amer: 10 mL/min — ABNORMAL LOW (ref >60)
GFR calc non Af Amer: 9 mL/min — ABNORMAL LOW (ref >60)
Glucose, Bld: 113 mg/dL — ABNORMAL HIGH (ref 70–99)
Potassium: 3.6 mmol/L (ref 3.5–5.1)
Sodium: 135 mmol/L (ref 135–145)
Total Bilirubin: 1.1 mg/dL (ref 0.3–1.2)
Total Protein: 5.7 g/dL — ABNORMAL LOW (ref 6.5–8.1)

## 2019-01-11 LAB — QUANTIFERON-TB GOLD PLUS (RQFGPL)
QuantiFERON Mitogen Value: >10 [IU]/mL
QuantiFERON Nil Value: 0.01 [IU]/mL
QuantiFERON TB1 Ag Value: 0.02 [IU]/mL
QuantiFERON TB2 Ag Value: 0.01 [IU]/mL

## 2019-01-11 LAB — CK: Total CK: 36 U/L — ABNORMAL LOW (ref 38–234)

## 2019-01-11 LAB — C-REACTIVE PROTEIN: CRP: 21.2 mg/dL — ABNORMAL HIGH

## 2019-01-11 LAB — CULTURE, BLOOD (ROUTINE X 2)
Culture: NO GROWTH
Culture: NO GROWTH
Special Requests: ADEQUATE
Special Requests: ADEQUATE

## 2019-01-11 LAB — QUANTIFERON-TB GOLD PLUS: QuantiFERON-TB Gold Plus: NEGATIVE

## 2019-01-11 LAB — PARATHYROID HORMONE, INTACT (NO CA)

## 2019-01-11 LAB — MAGNESIUM: Magnesium: 2 mg/dL (ref 1.7–2.4)

## 2019-01-11 MED ORDER — HEPARIN SODIUM (PORCINE) 1000 UNIT/ML IJ SOLN
INTRAMUSCULAR | Status: AC
  Filled 2019-01-11: qty 4

## 2019-01-11 MED ORDER — HEPARIN SODIUM (PORCINE) 1000 UNIT/ML DIALYSIS: 1000.0000 [IU] | INTRAMUSCULAR | Status: DC | PRN

## 2019-01-11 MED ORDER — IPRATROPIUM-ALBUTEROL 0.5-2.5 (3) MG/3ML IN SOLN
3.0000 mL | Freq: Two times a day (BID) | RESPIRATORY_TRACT | Status: DC
  Administered 2019-01-11 – 2019-01-13 (×4): 3 mL via RESPIRATORY_TRACT
  Filled 2019-01-11 (×3): qty 3

## 2019-01-11 MED ORDER — SODIUM CHLORIDE 0.9 % IV SOLN: 100.0000 mL | INTRAVENOUS | Status: DC | PRN

## 2019-01-11 MED ORDER — HEPARIN SODIUM (PORCINE) 1000 UNIT/ML DIALYSIS
40.0000 [IU]/kg | INTRAMUSCULAR | Status: DC | PRN
  Filled 2019-01-11: qty 4

## 2019-01-11 MED ORDER — METOPROLOL TARTRATE 25 MG PO TABS
25.0000 mg | ORAL_TABLET | Freq: Two times a day (BID) | ORAL | Status: AC
  Administered 2019-01-11: 22:00:00 25 mg via ORAL
  Filled 2019-01-11: qty 1

## 2019-01-11 MED ORDER — ISOSORBIDE DINITRATE 20 MG PO TABS
10.0000 mg | ORAL_TABLET | Freq: Two times a day (BID) | ORAL | Status: AC
  Administered 2019-01-11: 22:00:00 10 mg via ORAL
  Filled 2019-01-11: qty 0.5

## 2019-01-11 MED ORDER — ISOSORBIDE DINITRATE 20 MG PO TABS
20.0000 mg | ORAL_TABLET | Freq: Two times a day (BID) | ORAL | Status: DC
  Administered 2019-01-12 – 2019-01-20 (×14): 20 mg via ORAL
  Filled 2019-01-11 (×14): qty 1

## 2019-01-11 MED ORDER — METOPROLOL SUCCINATE ER 50 MG PO TB24
50.0000 mg | ORAL_TABLET | Freq: Every day | ORAL | Status: DC
  Administered 2019-01-12 – 2019-01-20 (×8): 50 mg via ORAL
  Filled 2019-01-11 (×8): qty 1

## 2019-01-11 MED ORDER — ALTEPLASE 2 MG IJ SOLR: 2.0000 mg | Freq: Once | INTRAMUSCULAR | Status: DC | PRN

## 2019-01-11 NOTE — Consult Note (Signed)
Cardiology Consultation:   Patient ID: Summer Cook; [id number]; 05/11/1966   Admit date: 01/06/2019 Date of Consult: 01/11/2019  Primary Care Provider: Redmond School, MD Primary Cardiologist: Rozann Lesches, MD Primary Electrophysiologist:  None  Patient Profile:   Summer Cook is a 53 y.o. female with a PMH of CAD s/p NSTEMI with totally occluded dPDA in 2018 (medically managed), HTN, HLD, CVA, ITP s/p slpenectomy, steroid induced DM, history of PE  who is being seen today for the evaluation of acute systolic CHF at the request of Dr. Grandville Silos.  History of Present Illness:   Summer Cook presented to the ED 01/06/2019 with complaints of fever, left flank pain, and cough. She was recently admitted to the hospital from 12/30/2018-01/04/2019 after presenting with persistent N/V and dysuria. She was found to have acute on chronic renal insufficiency c/f AIN vs GN. She underwent a renal biopsy 01/03/2019 and was discharged home the following day given stable HGB. Her HTN management was adjusted given hypertensive urgency on admission with improvement in BP's. She reported persistent left flank pain since her biopsy. Since discharge, she subsequently developed a cough, as well as fevers prompting her to present back to the ED for further evaluation on 01/06/2019. CT C/A/P on admission revealed pulmonary edema, patchy bilateral ground-glass opacities in both lungs (infectious vs inflammatory) c/f COVID-19, and a moderate-sized left extrarenal perinephric hematoma. 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 Unfortunately her renal function continued to decline and the patient became progressively SOB 2/2 volume overload (COVID-19 testing was negative). Trial of high does IV lasix was unsuccessful and ultimately patient had a tunneled catheter placed and was started on dialysis overnight (01/10/2019).  Echocardiogram 01/10/2019 revealed EF 35-40%, G2DD, diffuse LV hypokinesis, moderate MR, and mild pulmonary HTN. Cardiology was asked to evaluate patient given acute combined CHF.   Past Surgical History:  Procedure Laterality Date  . BONE MARROW BIOPSY    . CARDIAC CATHETERIZATION    . CARPAL TUNNEL RELEASE    . CARPAL TUNNEL RELEASE Left 05/20/2016   Procedure: CARPAL TUNNEL RELEASE;  Surgeon: Carole Civil, MD;  Location: [facility];  Service: Orthopedics;  Laterality: Left;  . CARPAL TUNNEL RELEASE Right 07/16/2016   Procedure: RIGHT CARPAL TUNNEL RELEASE;  Surgeon: Carole Civil, MD;  Location: [facility];  Service: Orthopedics;  Laterality: Right;  . CATARACT EXTRACTION    . CHOLECYSTECTOMY  2008  . COLONOSCOPY WITH ESOPHAGOGASTRODUODENOSCOPY (EGD) N/A 01/04/2013   Dr. Gala Romney: esophageal plaques with +KOH, hh. Gastric antrum abnormal, bx reactive gastropathy. Anal canal hemorrhoids, colonic tics, normal TI, single polyp (sessile serrated tubular adenoma). Next TCS 12/2017  . IR FLUORO GUIDE CV LINE RIGHT  01/10/2019  . IR US GUIDE VASC ACCESS RIGHT  01/10/2019  . LEFT HEART CATH AND CORONARY ANGIOGRAPHY N/A 05/20/2017   Procedure: LEFT HEART CATH AND CORONARY ANGIOGRAPHY;  Surgeon: Martinique, Peter M, MD;  Location: [facility];  Service: Cardiovascular;  Laterality: N/A;  . 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 TEE WITHOUT CARDIOVERSION N/A 01/03/2016   Procedure: TRANSESOPHAGEAL ECHOCARDIOGRAM (TEE) WITH PROPOFOL;  Surgeon: Satira Sark, MD;  Location: [facility];  Service: Cardiovascular;  Laterality: N/A;     Home Medications:  Prior to Admission medications   Medication Sig Start Date End Date Taking? Authorizing Provider  acetaminophen (TYLENOL) 500 MG tablet Take 1,000-1,500 mg by mouth every 6 (six) hours as needed for headache (pain).  Yes [provider]  albuterol (PROVENTIL HFA;VENTOLIN HFA) 108 (90 Base) MCG/ACT inhaler Inhale 2 puffs into the lungs every 6 (six)  hours as needed for wheezing or shortness of breath.   Yes [provider]  amLODipine (NORVASC) 10 MG tablet Take 1 tablet (10 mg total) by mouth daily for 30 days. 01/05/19 02/04/19 Yes Shah, Pratik D, DO  aspirin EC 81 MG EC tablet Take 1 tablet (81 mg total) by mouth daily. 05/23/17  Yes Eileen Stanford, PA-C  diclofenac sodium (VOLTAREN) 1 % GEL Apply 2 g topically 4 (four) times daily. Patient taking differently: Apply 2 g topically 4 (four) times daily as needed (pain).  06/06/17  Yes Rizwan, Eunice Blase, MD  ipratropium-albuterol (DUONEB) 0.5-2.5 (3) MG/3ML SOLN Take 3 mLs by nebulization 3 (three) times daily as needed (shorntess of breath/wheezing). Patient taking differently: Take 3 mLs by nebulization 3 (three) times daily as needed (shortness of breath/wheezing).  02/18/15  Yes Kathie Dike, MD  isosorbide dinitrate (ISORDIL) 10 MG tablet Take 1 tablet (10 mg total) by mouth 2 (two) times daily for 30 days. 01/04/19 02/03/19 Yes Shah, Pratik D, DO  metoprolol tartrate (LOPRESSOR) 25 MG tablet Take 1 tablet (25 mg total) by mouth 2 (two) times daily for 30 days. 01/04/19 02/03/19 Yes Shah, Pratik D, DO  nitroGLYCERIN (NITROSTAT) 0.4 MG SL tablet Place 1 tablet (0.4 mg total) under the tongue every 5 (five) minutes x 3 doses as needed for chest pain. 05/25/17  Yes Carlota Raspberry, Tiffany, PA-C  sertraline (ZOLOFT) 100 MG tablet Take 100 mg by mouth daily.  10/11/18  Yes [provider]  sodium bicarbonate 650 MG tablet Take 1 tablet (650 mg total) by mouth 2 (two) times daily for 30 days. 01/04/19 02/03/19 Yes Shah, Pratik D, DO  venlafaxine (EFFEXOR) 37.5 MG tablet Take 37.5 mg by mouth 2 (two) times daily. 12/22/18  Yes [provider]  prochlorperazine (COMPAZINE) 10 MG tablet Take 10 mg by mouth as needed. For nausea   12/18/11  [provider]    Inpatient Medications: Scheduled Meds: . ALPRAZolam  0.5 mg Oral TID  . amLODipine  10 mg Oral Daily  . calcium acetate  1,334 mg  Oral TID WC  . Chlorhexidine Gluconate Cloth  6 each Topical Q0600  . guaiFENesin  1,200 mg Oral BID  . heparin      . ipratropium-albuterol  3 mL Nebulization QID  . isosorbide dinitrate  10 mg Oral BID  . metoprolol tartrate  25 mg Oral BID  . sertraline  100 mg Oral Daily  . sodium bicarbonate  650 mg Oral TID  . sodium chloride flush  3 mL Intravenous Q12H   Continuous Infusions: . sodium chloride    . ceFEPime (MAXIPIME) IV 1 g (01/10/19 0939)  . ferric gluconate (FERRLECIT/NULECIT) IV 125 mg (01/10/19 0938)   PRN Meds: sodium chloride, albuterol, chlorpheniramine-HYDROcodone, HYDROmorphone (DILAUDID) injection, ipratropium-albuterol, oxyCODONE, sodium chloride flush  Allergies:    Allergies  Allergen Reactions  . Brintellix [Vortioxetine] Itching  . Cymbalta [Duloxetine Hcl] Itching  . Yellow Jacket Venom Swelling    Swelling at injection site  . Adhesive [Tape] Rash    Please use paper tape  . Penicillins Itching    Did it involve swelling of the face/tongue/throat, SOB, or low BP? No Did it involve sudden or severe rash/hives, skin peeling, or any reaction on the inside of your mouth or nose? No Did you need to seek medical attention at a hospital  or doctor's office? No When did it last happen?53 yrs old If all above answers are "NO", may proceed with cephalosporin use.    Social History:   Social History   Socioeconomic History  . Marital status: Divorced    Spouse name: Not on file  . Number of children: 1  . Years of education: Not on file  . 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   Social Needs  . Financial resource strain: Not hard at all  . Food insecurity:    Worry: Sometimes true    Inability: Sometimes true  . Transportation needs:    Medical: No    Non-medical: No  Tobacco Use  . Smoking status: Former Smoker    Years: 0.00    Types: Cigarettes     Last attempt to quit: 02/14/2009    Years since quitting: 9.9  . Smokeless tobacco: Never Used  Substance and Sexual Activity  . Alcohol use: No    Alcohol/week: 0.0 standard drinks  . Drug use: No  . Sexual activity: Never  Lifestyle  . Physical activity:    Days per week: 2 days    Minutes per session: 30 min  . Stress: Rather much  Relationships  . Social connections:    Talks on phone: More than three times a week    Gets together: More than three times a week    Attends religious service: Never    Active member of club or organization: No    Attends meetings of clubs or organizations: Never    Relationship status: Divorced  . Intimate partner violence:    Fear of current or ex partner: No    Emotionally abused: No    Physically abused: No    Forced sexual activity: No  Other Topics Concern  . Not on file  Social History Narrative   Lives with her daughter and aunt in a one story home.  Has 1 daughter.  On disability.  Did work as a Optometrist and bus Geophysicist/field seismologist.  Education: some college.     Family History:    Family History  Problem Relation Age of Onset  . Cervical cancer Mother   . Lung cancer Father   . Colon polyps Father        ???  . Pneumonia Brother   . Colon cancer Paternal Grandmother 88  . AAA (abdominal aortic aneurysm) Paternal Grandmother   . Breast cancer Paternal Grandmother   . Colon cancer Paternal Grandfather 27  . Barrett's esophagus Paternal Aunt   . Stomach cancer Neg Hx   . Pancreatic cancer Neg Hx      ROS:  Please see the history of present illness.   All other ROS reviewed and negative.     Physical Exam/Data:   Vitals:   01/11/19 0430 01/11/19 0634 01/11/19 0851 01/11/19 0900  BP: 120/78     Pulse: 92 92    Resp: 16 14    Temp:    98.7 F (37.1 C)  TempSrc:    Oral  SpO2: 98% 97% 95%   Weight:  91 kg    Height:        Intake/Output Summary (Last 24 hours) at 01/11/2019 1030 Last data filed at 01/11/2019  0003 Gross per 24 hour  Intake 100 ml  Output 2000 ml  Net -1900 ml   Filed Weights   01/10/19  2125 01/11/19 0003 01/11/19 0634  Weight: 93.1 kg 90.6 kg 91 kg   Body mass index is 33.38 kg/m.  General: Somnolent HEENT: sclera anicteric  Lymph: no adenopathy Neck: no JVD Endocrine:  No thryomegaly Vascular: No carotid bruits; distal pulses 2+ bilaterally Cardiac:  normal S1, S2; RRR; no murmur Lungs: Decreased breathing sounds at bases bilaterally, no wheezing, rhonchi or rales  Abd: NABS, soft, nontender, no hepatomegaly Ext: Mild bilateral nonpitting edema Musculoskeletal:  No deformities, BUE and BLE strength normal and equal Skin: warm and dry  Neuro:  CNs 2-12 intact, no focal abnormalities noted Psych:  Normal affect   EKG:  The EKG was personally reviewed and demonstrates:  Sinus rhythm with non-specific IVCD (LBBB?), with TWI in lateral leads (present since 12/30/2018) Telemetry: Not on telemetry  Relevant CV Studies: Echcocardiogram 01/10/2019: IMPRESSIONS    1. The left ventricle has moderately reduced systolic function, with an ejection fraction of 35-40%. The cavity size was mildly dilated. Left ventricular diastolic Doppler parameters are consistent with pseudonormalization. Left ventricular diffuse  hypokinesis.  2. The right ventricle has normal systolic function. The cavity was normal. Right ventricular systolic pressure is mildly elevated with an estimated pressure of 34.4 mmHg.  3. The mitral valve is grossly normal. Mild thickening of the mitral valve leaflet. Mitral valve regurgitation is moderate by color flow Doppler.  4. The tricuspid valve is grossly normal.  5. The aortic valve is tricuspid. Mild thickening of the aortic valve. Aortic valve regurgitation is trivial by color flow Doppler.  6. The inferior vena cava was dilated in size with <50% respiratory variability.  7. Moderate LV dysfunction; mild LVE; moderate diastolic dysfunction; moderate MR;  mild TR; mild pulmonary hypertension.  Left heart catheterization 2018:   RPDA lesion, 100 %stenosed.  The left ventricular systolic function is normal.  LV end diastolic pressure is normal.  The left ventricular ejection fraction is 55-65% by visual estimate.   1. 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 Otherwise normal coronary arteries. 3. Normal LV function with very small focal area of distal inferior hypokinesis.  Plan: medical therapy.  Laboratory Data:  Chemistry Recent Labs  Lab 01/09/19 0729 01/10/19 0234 01/11/19 0258  NA 134*  134* 134*  134* 135  135  K 4.8  4.8 4.3  4.4 3.6  3.5  CL 106  105 106  106 102  101  CO2 15*  17* 14*  15* 20*  22  GLUCOSE 88  89 89  92 113*  113*  BUN 72*  73* 81*  81* 44*  44*  CREATININE 7.50*  7.76* 7.76*  7.86* 5.31*  5.27*  CALCIUM 8.0*  8.0* 8.1*  8.1* 8.0*  8.0*  GFRNONAA 6*  5* 5*  5* 9*  9*  GFRAA 7*  6* 6*  6* 10*  10*  ANIONGAP _0 Recent Labs  Lab 01/09/19 0729 01/10/19 0234 01/11/19 0258  PROT 5.1* 5.4* 5.7*  ALBUMIN 2.4*  2.4* 2.3*  2.3* 2.4*  2.4*  AST 11* 12* 15  ALT _1 ALKPHOS 49 45 56  BILITOT 0.8 1.1 1.1   Hematology Recent Labs  Lab 01/09/19 0729 01/10/19 0234 01/11/19 0258  WBC 9.6 10.2 11.5*  RBC 2.51* 2.76* 2.96*  HGB 6.9* 7.8* 8.4*  HCT 22.5* 24.6* 25.9*  MCV 89.6 89.1 87.5  MCH 27.5 28.3 28.4  MCHC 30.7 31.7 32.4  RDW  15.6* 15.1 14.8  PLT 149* 153 155   Cardiac Enzymes Recent Labs  Lab 01/07/19 0827 01/07/19 1119 01/07/19 1835 01/07/19 2255 01/10/19 0234  TROPONINI 0.09* 0.07* 0.07* 0.06* 0.05*   No results for input(s): TROPIPOC in the last 168 hours.  BNPNo results for input(s): BNP, PROBNP in the last 168 hours.  DDimer  Recent Labs  Lab 01/08/19 0631 04-Feb-2019 0729 01/10/19 0234  DDIMER 1.87* 1.83* 2.86*    Radiology/Studies:  US Renal  Result Date: 02/04/19 CLINICAL DATA:   53 year old female with a history of medical renal disease. Prior biopsy 01/03/2019 EXAM: RENAL / URINARY TRACT ULTRASOUND COMPLETE COMPARISON:  CT 01/06/2019, ultrasound 01/04/2019 FINDINGS: Right Kidney: Length: 7.8 cm x 4.3 cm x 4.5 cm, 78 cc. No hydronephrosis. Echogenic cortex, similar to the prior. Flow confirmed in the hilum of the right kidney. Left Kidney: Length: 9.0 cm x 3.9 cm x 3.6 cm, 65 cc. No hydronephrosis. Similar echogenicity to the comparison, symmetric to the contralateral. No large perinephric fluid. Bladder: Appears normal for degree of bladder distention. Incidental right pleural effusion. IMPRESSION: No evidence of hydronephrosis. No large fluid collection adjacent to the left kidney. Right pleural effusion. Electronically Signed   By: Corrie Mckusick D.O.   On: 02/04/2019 19:48   Ir Fluoro Guide Cv Line Right  Result Date: 01/10/2019 INDICATION: 36 year old with acute kidney injury and needs a dialysis catheter. EXAM: FLUOROSCOPIC AND ULTRASOUND GUIDED PLACEMENT OF A TUNNELED DIALYSIS CATHETER Physician: Stephan Minister. Anselm Pancoast, MD MEDICATIONS: Vancomycin 1 gm IV; The antibiotic was administered within an appropriate time interval prior to skin puncture. ANESTHESIA/SEDATION: No sedation was given due to respiratory distress. Patient tolerated the procedure well with a non-rebreather. FLUOROSCOPY TIME:  Fluoroscopy Time: 36 seconds, 17 mGy COMPLICATIONS: None immediate. PROCEDURE: Informed consent was obtained for placement of a tunneled dialysis catheter. The patient was placed supine on the interventional table. Ultrasound confirmed a patent right internal jugular vein. Ultrasound images were obtained for documentation. The right side of the neck and chest was prepped and draped in a sterile fashion. The right side of the neck was anesthetized with 1% lidocaine. Maximal barrier sterile technique was utilized including caps, mask, sterile gowns, sterile gloves, sterile drape, hand hygiene and skin  antiseptic. A small incision was made with #11 blade scalpel. A 21 gauge needle directed into the right internal jugular vein with ultrasound guidance. A micropuncture dilator set was placed. A 23 cm tip to cuff Palindrome catheter was selected. The skin below the right clavicle was anesthetized and a small incision was made with an #11 blade scalpel. A subcutaneous tunnel was formed to the vein dermatotomy site. The catheter was brought through the tunnel. The vein dermatotomy site was dilated to accommodate a peel-away sheath. The catheter was placed through the peel-away sheath and directed into the central venous structures. The tip of the catheter was placed in the upper right atrium with fluoroscopy. Fluoroscopic images were obtained for documentation. Both lumens were found to aspirate and flush well. The proper amount of heparin was flushed in both lumens. The vein dermatotomy site was closed using a single layer of absorbable suture and Dermabond. Gel-Foam was placed in the subcutaneous tract. The catheter was secured to the skin using Prolene suture. IMPRESSION: Successful placement of a right jugular tunneled dialysis catheter using ultrasound and fluoroscopic guidance. Electronically Signed   By: Markus Daft M.D.   On: 01/10/2019 15:19   Ir US Guide Vasc Access Right  Result Date: 01/10/2019 INDICATION:  19 year old with acute kidney injury and needs a dialysis catheter. EXAM: FLUOROSCOPIC AND ULTRASOUND GUIDED PLACEMENT OF A TUNNELED DIALYSIS CATHETER Physician: Stephan Minister. Anselm Pancoast, MD MEDICATIONS: Vancomycin 1 gm IV; The antibiotic was administered within an appropriate time interval prior to skin puncture. ANESTHESIA/SEDATION: No sedation was given due to respiratory distress. Patient tolerated the procedure well with a non-rebreather. FLUOROSCOPY TIME:  Fluoroscopy Time: 36 seconds, 17 mGy COMPLICATIONS: None immediate. PROCEDURE: Informed consent was obtained for placement of a tunneled dialysis  catheter. The patient was placed supine on the interventional table. Ultrasound confirmed a patent right internal jugular vein. Ultrasound images were obtained for documentation. The right side of the neck and chest was prepped and draped in a sterile fashion. The right side of the neck was anesthetized with 1% lidocaine. Maximal barrier sterile technique was utilized including caps, mask, sterile gowns, sterile gloves, sterile drape, hand hygiene and skin antiseptic. A small incision was made with #11 blade scalpel. A 21 gauge needle directed into the right internal jugular vein with ultrasound guidance. A micropuncture dilator set was placed. A 23 cm tip to cuff Palindrome catheter was selected. The skin below the right clavicle was anesthetized and a small incision was made with an #11 blade scalpel. A subcutaneous tunnel was formed to the vein dermatotomy site. The catheter was brought through the tunnel. The vein dermatotomy site was dilated to accommodate a peel-away sheath. The catheter was placed through the peel-away sheath and directed into the central venous structures. The tip of the catheter was placed in the upper right atrium with fluoroscopy. Fluoroscopic images were obtained for documentation. Both lumens were found to aspirate and flush well. The proper amount of heparin was flushed in both lumens. The vein dermatotomy site was closed using a single layer of absorbable suture and Dermabond. Gel-Foam was placed in the subcutaneous tract. The catheter was secured to the skin using Prolene suture. IMPRESSION: Successful placement of a right jugular tunneled dialysis catheter using ultrasound and fluoroscopic guidance. Electronically Signed   By: Markus Daft M.D.   On: 01/10/2019 15:19   Dg Chest Port 1 View  Result Date: 01/10/2019 CLINICAL DATA:  53 year old female with shortness of breath and cough EXAM: PORTABLE CHEST 1 VIEW COMPARISON:  01/09/2019 FINDINGS: Cardiomediastinal silhouette  unchanged in size and contour. Interval improvement in aeration bilateral lungs with they will opacities at the bases of the lungs. Opacity at the right lung base and left lung base obscuring the costophrenic angles and the hemidiaphragm. Mixed interstitial and airspace opacity of the right hilar and suprahilar region, slightly improved from the prior. IMPRESSION: Improving aeration in the right lung with persistent interstitial and airspace opacity of the hilar and suprahilar region. Bilateral pleural effusions with associated atelectasis/consolidation. Electronically Signed   By: Corrie Mckusick D.O.   On: 01/10/2019 08:15   Dg Chest Port 1 View  Result Date: 01/09/2019 CLINICAL DATA:  53 year old female with shortness of breath EXAM: PORTABLE CHEST 1 VIEW COMPARISON:  01/09/2019, 01/08/2019, 01/06/2019, CT chest 01/06/2019 FINDINGS: Cardiomediastinal silhouette is unchanged in size and contour. Heart borders partially obscured by overlying lung/pleural disease. Failed opacities at the bilateral lung bases, with dense opacities obscuring the bilateral hemidiaphragms and heart borders. Mixed interstitial and airspace opacities of the right hilar and suprahilar region extending superiorly. No pneumothorax.  Aeration maintained in the left upper lobe. IMPRESSION: Similar appearance of the chest x-ray, with right greater than left airspace/interstitial disease and bilateral pleural effusions with associated atelectasis/consolidation. Electronically Signed  By: Corrie Mckusick D.O.   On: 01/09/2019 17:02   Dg Chest Port 1 View  Result Date: 01/09/2019 CLINICAL DATA:  Cough and shortness of breath. EXAM: PORTABLE CHEST 1 VIEW COMPARISON:  Chest x-ray from yesterday. FINDINGS: Stable cardiomediastinal silhouette. Normal pulmonary vascularity. Worsening patchy opacity in the right upper lobe. Unchanged moderate left and small right pleural effusions with left greater than right lower lobe atelectasis/consolidation.  No pneumothorax. No acute osseous abnormality. IMPRESSION: 1. Worsening right upper lobe pneumonia. 2. Unchanged moderate left and small right pleural effusions with left greater than right lower lobe atelectasis/infiltrate. Electronically Signed   By: Titus Dubin M.D.   On: 01/09/2019 08:41   Dg Chest Port 1 View  Result Date: 01/08/2019 CLINICAL DATA:  Cough, chest congestion and shortness of breath. Follow-up CHF versus pneumonia. EXAM: PORTABLE CHEST 1 VIEW COMPARISON:  CT chest and chest x-ray 01/06/2019 and earlier. FINDINGS: Cardiac silhouette mildly enlarged, unchanged. Pulmonary vascularity now normal. Developing patchy airspace opacities in the RIGHT UPPER LOBE. Moderate to large BILATERAL pleural effusions, increased in size on the RIGHT and unchanged on the LEFT, with associated dense consolidation in the lower lobes. IMPRESSION: 1. Developing pneumonia involving the RIGHT upper lobe. 2. Moderate to large BILATERAL pleural effusions, increased in size on the RIGHT and unchanged on the LEFT. Associated dense BILATERAL lower lobe atelectasis and/or pneumonia. 3. Stable mild cardiomegaly without pulmonary edema currently as the pulmonary vascularity is now normal. Electronically Signed   By: Evangeline Dakin M.D.   On: 01/08/2019 11:00    Assessment and Plan:   Summer Cook is a 53 y.o. female with a PMH of CAD s/p NSTEMI with totally occluded dPDA in 2018 (medically managed), HTN, HLD, CVA, ITP s/p slpenectomy, steroid induced DM, history of PE  who is being seen today for the evaluation of acute systolic CHF at the request of Dr. Grandville Silos.  History of Present Illness:   1. Acute combined CHF:  -Secondary to new acute anuric kidney failure -Continue daily dialysis -We will focus on blood pressure management -We will arrange for a Lexiscan nuclear stress test to rule out ischemia  2.  New decreased LVEF -Previously normal now 35 to 40% with diffuse hypokinesis, this can be  response to sepsis and acute kidney failure however ischemia should be ruled out -We will focus on blood pressure management, we are unable to use ACEi/ARB/Entresto/spironolactone given renal dysfunction  3. HTN: BP significantly improved from previous admission where she presented with hypertensive urgency. She was discharged on amlodipine 58m daily, Metoprolol tartrate 225mBID, and isosorbide dinitrate 1075mID. BP has been well controlled over the past 24 hours.  - Will stop amlodipine at this time given LV systolic dysfunction - Will transition to metoprolol succinate [id number] starting tomorrow - Will  increase isosorbide dinitrate to 20mg59m starting tomorrow - Further titration of medication pending BP response to above.   4. CAD s/p NSTEMI with totally occluded dPDA medically managed: -  -The patient admits new worsening chest pain - Consider stress test to further evaluate for ischemic cause of reduced LV systolic function - Continue aspirin - Unclear why she is not on a statin - consider starting atorvastatin 40mg 8my  5. 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 Underwent a renal biopsy which was concerning for TMA 2/2 poorly controlled HTN. Unfortunately Cr continued  to worsen and patient underwent tunneled catheter placement 01/09/2019 and was started on dialysis for management of uremia and volume overload. Cr improved to 5.31 today. On antibiotics for possible infection - Continue to avoid nephrotoxic agents - Continue management per Nephrology/primary team  For questions or updates, please contact [place] Please consult [email or web address] for contact info under Cardiology/STEMI.   Signed, Ena Dawley, MD 01/11/2019 10:30 AM

## 2019-01-11 NOTE — Progress Notes (Signed)
PROGRESS NOTE    Summer Cook  OZY:[id number] DOB: 09/15/1966 DOA: 01/06/2019 PCP: Redmond School, MD    Brief Narrative:  53 year old female with history of hypertension, CAD, GERD, ITP status post splenectomy, PE in 2013, chronic kidney disease stage III and recent AKI on CKD who underwent kidney biopsy on 01/03/2019 with subsequent left flank pain since discharge associated with fever and cough.  In the ED, CT scan of the chest abdomen and pelvis showed cardiomegaly with bilateral pleural effusions and findings consistent with pulmonary edema along with patchy bilateral groundglass opacities in both lungs.  CT scan of the abdomen and pelvis showed a recent left renal biopsy with moderate sized left extrarenal perinephric hematoma with a small foci of air within the hematoma raising question of superimposed infection.  She was admitted for the same.  Nephrology was consulted.  During the hospitalization, her respiratory status worsened.  PCCM was also consulted.  COVID-19 was tested which turned out negative.  Initially she was kept on isolation but after discussion with ID, droplet and contact precautions were discontinued.    Assessment & Plan:   Principal Problem:   Acute renal failure superimposed on stage 3 chronic kidney disease (HCC) Active Problems:   Acute respiratory failure with hypoxia (HCC)   Acute on chronic systolic congestive heart failure (HCC)   Essential hypertension   Stage 3 chronic kidney disease (HCC)   Diabetes mellitus type 2, uncomplicated (HCC)   CAD (coronary artery disease): total distal RCA EF normal (05/20/17)   Fever   CKD (chronic kidney disease), stage V (HCC)   Cough   Renal hematoma, left   Iron deficiency anemia   #1 acute renal failure on chronic kidney disease stage III versus progressive chronic kidney disease Patient noted to go into worsening acute kidney injury and nonoliguric seen by nephrology and subsequently started on hemodialysis due  to worsening renal function and worsening volume overload.  Patient underwent renal biopsy on 01/03/2019 which was felt to be associated with hypertension with moderate scarring with a poor long-term prognosis.  Patient receiving hemodialysis due to worsening renal function and volume overload.  Per nephrology.  2.  Acute hypoxic respiratory failure secondary to acute systolic heart failure likely secondary to acute renal failure Patient noted to have a worsening acute hypoxic respiratory failure felt likely secondary to volume overload secondary to acute renal failure.  Patient was seen by pulmonary and felt to have a low pretest clinical suspicion for COVID-19 with a negative test.  Patient with some clinical improvement after being started on hemodialysis.  2D echo with a EF of 35 to 40% with left ventricular diffuse hypokinesis.  Patient noted to have a normal EF on 05/21/2017.  Patient currently receiving hemodialysis with 1.5 L of fluid removed yesterday.  Due to depressed ejection fraction will consult with cardiology for further evaluation and management and to determine whether further ischemic work-up is needed.  Nephrology following.  3.  Fever /left extrarenal perinephric hematoma Status post recent renal biopsy.  Patient hemodynamically stable.  CT abdomen and pelvis which was done was concerning for perinephric hematoma with question for superimposed infection.  Patient currently afebrile.  Influenza PCR negative.  Respiratory viral panel negative.  COVID-19 testing negative.  Due to worsening respiratory function and due to concern for COVID-19 patient received a dose of hydroxychloroquine which was subsequently discontinued after tests came back negative.  Case was discussed with ID and by prior hospitalist who recommended discontinuation of airborne and  droplet precautions.  Patient on empiric IV antibiotics of cefepime. White count of 11.5.  If patient spikes fevers may need to repeat CT abdomen  and pelvis to rule out abscess formation. Per nephrology.  Follow.  4.  Iron deficiency anemia Iron level of 12.  TIBC of 249.  Patient receiving IV Nulecit per nephrology.  Status post 1 unit packed red blood cells.  Hemoglobin currently at 8.4 from 6.9.  Follow H&H.  5.  Metabolic acidosis Secondary to acute kidney injury versus progressive chronic kidney disease.  Patient currently on bicarb tablets.  Per nephrology.  6.  Coronary artery disease status post totally occluded PDA Medically managed prior to admission.  2D echo done with a depressed ejection fraction of 35 to 40% which is worse than prior 2D echo.  Aspirin on hold due to hematoma..  Continue metoprolol, isosorbide.  Due to depressed ejection fraction will consult with cardiology for further evaluation and management.  7.  Hypertension Continue metoprolol, Norvasc, isosorbide.  8.  Obesity Outpatient follow-up.  DVT prophylaxis: SCD Code Status: Full Family Communication: Updated patient.  No family at bedside. Disposition Plan: To be determined.   Consultants:   Nephrology: Dr. Justin Mend 01/07/2019  PCCM Dr. Lynetta Mare 01/09/2019  Interventional radiology: Dr. Anselm Pancoast 01/10/2019  Cardiology pending  Procedures:  CT chest 01/06/2019  CT renal stone protocol for 07/18/2019  Chest x-ray 01/06/2019, 01/08/2019, 01/09/2019, 01/10/2019  Upper scopic and ultrasound-guided placement of tunneled dialysis catheter per Dr. Anselm Pancoast 01/10/2019  Renal ultrasound 01/09/2019  2D echo 01/10/2019  Transfuse 1 unit packed red blood cells 01/09/2019  Antimicrobials:   IV cefepime 01/07/2019 >>>>   IV vancomycin 01/10/2019 x 1 dose    Subjective: Patient somewhat drowsy.  Patient however following commands.  Answering question greatly.  Denies any chest pain.  Feels fatigued from busy day yesterday.  Objective: Vitals:   01/11/19 1700 01/11/19 1710 01/11/19 1751 01/11/19 1752  BP: 123/74 132/83  140/90  Pulse: 92 96  100  Resp:  16     Temp:  98.6 F (37 C) 98.3 F (36.8 C) 98.3 F (36.8 C)  TempSrc:  Oral Oral Oral  SpO2:  98%    Weight:  88.5 kg    Height:        Intake/Output Summary (Last 24 hours) at 01/11/2019 1902 Last data filed at 01/11/2019 1710 Gross per 24 hour  Intake 100 ml  Output 3006 ml  Net -2906 ml   Filed Weights   01/11/19 0003 01/11/19 0634 01/11/19 1710  Weight: 90.6 kg 91 kg 88.5 kg    Examination:  General exam: Appears calm and comfortable  Respiratory system: Decreased breath sounds in the bases.  Normal respiratory effort. Cardiovascular system: S1 & S2 heard, RRR. No JVD, murmurs, rubs, gallops or clicks. No pedal edema. Gastrointestinal system: Abdomen is nondistended, soft and nontender. No organomegaly or masses felt. Normal bowel sounds heard. Central nervous system: Alert.. No focal neurological deficits. Extremities: Trace lower extremity edema.  Symmetric 5 x 5 power. Skin: No rashes, lesions or ulcers Psychiatry: Judgement and insight appear fair. Mood & affect appropriate.     Data Reviewed: I have personally reviewed following labs and imaging studies  CBC: Recent Labs  Lab 01/06/19 2156 01/07/19 0827 01/08/19 0631 01/09/19 0729 01/10/19 0234 01/11/19 0258  WBC 7.8 8.8 10.0 9.6 10.2 11.5*  NEUTROABS 4.6 5.9  --  5.9 6.6 9.1*  HGB 8.0* 8.8* 7.4* 6.9* 7.8* 8.4*  HCT 25.6* 28.6* 23.4* 22.5* 24.6*  25.9*  MCV 90.5 91.7 90.0 89.6 89.1 87.5  PLT 188 190 168 149* 153 973   Basic Metabolic Panel: Recent Labs  Lab 01/07/19 0827 01/08/19 0631 01/09/19 0729 01/10/19 0234 01/11/19 0258  NA 136 135 134*  134* 134*  134* 135  135  K 4.1 4.4 4.8  4.8 4.3  4.4 3.6  3.5  CL 107 105 106  105 106  106 102  101  CO2 17* 15* 15*  17* 14*  15* 20*  22  GLUCOSE 91 95 88  89 89  92 113*  113*  BUN 71* 70* 72*  73* 81*  81* 44*  44*  CREATININE 6.82* 7.26* 7.50*  7.76* 7.76*  7.86* 5.31*  5.27*  CALCIUM 7.8* 7.7* 8.0*  8.0* 8.1*  8.1* 8.0*   8.0*  MG  --  2.0 2.0 2.0 2.0  PHOS  --  7.4* 8.2*  8.2* 8.4*  8.6* 5.3*   GFR: Estimated Creatinine Clearance: 13.6 mL/min (A) (by C-G formula based on SCr of 5.27 mg/dL (H)). Liver Function Tests: Recent Labs  Lab 01/07/19 0827 01/08/19 0631 01/09/19 0729 01/10/19 0234 01/11/19 0258  AST 16 12* 11* 12* 15  ALT 11 10 7 8 9   ALKPHOS 60 49 49 45 56  BILITOT 0.7 0.7 0.8 1.1 1.1  PROT 6.7 5.2* 5.1* 5.4* 5.7*  ALBUMIN 3.2* 2.4*  2.4* 2.4*  2.4* 2.3*  2.3* 2.4*  2.4*   No results for input(s): LIPASE, AMYLASE in the last 168 hours. No results for input(s): AMMONIA in the last 168 hours. Coagulation Profile: No results for input(s): INR, PROTIME in the last 168 hours. Cardiac Enzymes: Recent Labs  Lab 01/07/19 0827 01/07/19 1119 01/07/19 1835 01/07/19 2255 01/08/19 0631 01/09/19 0729 01/10/19 0234 01/11/19 0258  CKTOTAL  --  34*  --   --  27* 33* 28* 36*  TROPONINI 0.09* 0.07* 0.07* 0.06*  --   --  0.05*  --    BNP (last 3 results) No results for input(s): PROBNP in the last 8760 hours. HbA1C: No results for input(s): HGBA1C in the last 72 hours. CBG: No results for input(s): GLUCAP in the last 168 hours. Lipid Profile: Recent Labs    01/10/19 0234  TRIG 112   Thyroid Function Tests: No results for input(s): TSH, T4TOTAL, FREET4, T3FREE, THYROIDAB in the last 72 hours. Anemia Panel: Recent Labs    01/09/19 0729 01/10/19 0234  FERRITIN 395* 503*   Sepsis Labs: Recent Labs  Lab 01/06/19 2156 01/07/19 0827  PROCALCITON  --  <0.10  LATICACIDVEN 0.8  --     Recent Results (from the past 240 hour(s))  Blood Culture (routine x 2)     Status: None   Collection Time: 01/06/19  9:56 PM  Result Value Ref Range Status   Specimen Description RIGHT ANTECUBITAL  Final   Special Requests   Final    BOTTLES DRAWN AEROBIC AND ANAEROBIC Blood Culture adequate volume   Culture   Final    NO GROWTH 5 DAYS Performed at [facility], [address]  [place], [place] [address]    Report Status 01/11/2019 FINAL  Final  Blood Culture (routine x 2)     Status: None   Collection Time: 01/06/19 10:00 PM  Result Value Ref Range Status   Specimen Description LEFT ANTECUBITAL  Final   Special Requests   Final    BOTTLES DRAWN AEROBIC AND ANAEROBIC Blood Culture adequate volume   Culture  Final    NO GROWTH 5 DAYS Performed at [facility], [address] [place], [place] [address]    Report Status 01/11/2019 FINAL  Final  Respiratory Panel by PCR     Status: None   Collection Time: 01/07/19  7:20 AM  Result Value Ref Range Status   Adenovirus NOT DETECTED NOT DETECTED Final   Coronavirus 229E NOT DETECTED NOT DETECTED Final    Comment: (NOTE) The Coronavirus on the Respiratory Panel, DOES NOT test for the novel  Coronavirus (2019 nCoV)    Coronavirus HKU1 NOT DETECTED NOT DETECTED Final   Coronavirus NL63 NOT DETECTED NOT DETECTED Final   Coronavirus OC43 NOT DETECTED NOT DETECTED Final   Metapneumovirus NOT DETECTED NOT DETECTED Final   Rhinovirus / Enterovirus NOT DETECTED NOT DETECTED Final   Influenza A NOT DETECTED NOT DETECTED Final   Influenza B NOT DETECTED NOT DETECTED Final   Parainfluenza Virus 1 NOT DETECTED NOT DETECTED Final   Parainfluenza Virus 2 NOT DETECTED NOT DETECTED Final   Parainfluenza Virus 3 NOT DETECTED NOT DETECTED Final   Parainfluenza Virus 4 NOT DETECTED NOT DETECTED Final   Respiratory Syncytial Virus NOT DETECTED NOT DETECTED Final   Bordetella pertussis NOT DETECTED NOT DETECTED Final   Chlamydophila pneumoniae NOT DETECTED NOT DETECTED Final   Mycoplasma pneumoniae NOT DETECTED NOT DETECTED Final    Comment: Performed at [facility], [address], [place], [place] [address]  Novel Coronavirus, NAA (hospital order; send-out to ref lab)     Status: None   Collection Time: 01/07/19  7:30 AM  Result Value Ref Range Status   SARS-CoV-2, NAA NOT DETECTED NOT DETECTED Final    Comment: Performed  at [facility] Performed at [facility], [place] [address] [place], [place] [address]    Coronavirus Source NASOPHARYNGEAL  Final    Comment: Performed at [facility], [address], [place], [place] [address]         Radiology Studies: Ir Cyndy Freeze Guide Cv Line Right  Result Date: 01/10/2019 INDICATION: 56 year old with acute kidney injury and needs a dialysis catheter. EXAM: FLUOROSCOPIC AND ULTRASOUND GUIDED PLACEMENT OF A TUNNELED DIALYSIS CATHETER Physician: Stephan Minister. Anselm Pancoast, MD MEDICATIONS: Vancomycin 1 gm IV; The antibiotic was administered within an appropriate time interval prior to skin puncture. ANESTHESIA/SEDATION: No sedation was given due to respiratory distress. Patient tolerated the procedure well with a non-rebreather. FLUOROSCOPY TIME:  Fluoroscopy Time: 36 seconds, 17 mGy COMPLICATIONS: None immediate. PROCEDURE: Informed consent was obtained for placement of a tunneled dialysis catheter. The patient was placed supine on the interventional table. Ultrasound confirmed a patent right internal jugular vein. Ultrasound images were obtained for documentation. The right side of the neck and chest was prepped and draped in a sterile fashion. The right side of the neck was anesthetized with 1% lidocaine. Maximal barrier sterile technique was utilized including caps, mask, sterile gowns, sterile gloves, sterile drape, hand hygiene and skin antiseptic. A small incision was made with #11 blade scalpel. A 21 gauge needle directed into the right internal jugular vein with ultrasound guidance. A micropuncture dilator set was placed. A 23 cm tip to cuff Palindrome catheter was selected. The skin below the right clavicle was anesthetized and a small incision was made with an #11 blade scalpel. A subcutaneous tunnel was formed to the vein dermatotomy site. The catheter was brought through the tunnel. The vein dermatotomy site was dilated to accommodate a peel-away sheath. The catheter was  placed through the peel-away sheath and directed into the central venous structures. The tip of the catheter was placed in the upper right atrium with fluoroscopy. Fluoroscopic images were obtained for documentation. Both lumens were found to aspirate and flush well. The proper amount of heparin was flushed in both lumens. The vein dermatotomy site was closed using a single layer of absorbable suture and Dermabond. Gel-Foam was placed in the subcutaneous tract. The catheter was secured to the skin using Prolene suture. IMPRESSION: Successful placement of a right jugular tunneled dialysis catheter using ultrasound and fluoroscopic guidance. Electronically Signed   By: Markus Daft M.D.   On: 01/10/2019 15:19   Ir US Guide Vasc Access Right  Result Date: 01/10/2019 INDICATION: 42 year old with acute kidney injury and needs a dialysis catheter. EXAM: FLUOROSCOPIC AND ULTRASOUND GUIDED PLACEMENT OF A TUNNELED DIALYSIS CATHETER Physician: Stephan Minister. Anselm Pancoast, MD MEDICATIONS: Vancomycin 1 gm IV; The antibiotic was administered within an appropriate time interval prior to skin puncture. ANESTHESIA/SEDATION: No sedation was given due to respiratory distress. Patient tolerated the procedure well with a non-rebreather. FLUOROSCOPY TIME:  Fluoroscopy Time: 36 seconds, 17 mGy COMPLICATIONS: None immediate. PROCEDURE: Informed consent was obtained for placement of a tunneled dialysis catheter. The patient was placed supine on the interventional table. Ultrasound confirmed a patent right internal jugular vein. Ultrasound images were obtained for documentation. The right side of the neck and chest was prepped and draped in a sterile fashion. The right side of the neck was anesthetized with 1% lidocaine. Maximal barrier sterile technique was utilized including caps, mask, sterile gowns, sterile gloves, sterile drape, hand hygiene and skin antiseptic. A small incision was made with #11 blade scalpel. A 21 gauge needle directed into the  right internal jugular vein with ultrasound guidance. A micropuncture dilator set was placed. A 23 cm tip to cuff Palindrome catheter was selected. The skin below the right clavicle was anesthetized and a small incision was made with an #11 blade scalpel. A subcutaneous tunnel was formed to the vein dermatotomy site. The catheter was brought through the tunnel. The vein dermatotomy site was dilated to accommodate a peel-away sheath. The catheter was placed through the peel-away sheath and directed into the central venous structures. The tip of the catheter was placed in the upper right atrium with fluoroscopy. Fluoroscopic images were obtained for documentation. Both lumens were found to aspirate and flush well. The proper amount of heparin was flushed in both lumens. The vein dermatotomy site was closed using a single layer of absorbable suture and Dermabond. Gel-Foam was placed in the subcutaneous tract. The catheter was secured to the skin using Prolene suture. IMPRESSION: Successful placement of a right jugular tunneled dialysis catheter using ultrasound and fluoroscopic guidance. Electronically Signed   By: Markus Daft M.D.   On: 01/10/2019 15:19   Dg Chest Port 1 View  Result Date: 01/10/2019 CLINICAL DATA:  53 year old female with shortness of breath and cough EXAM: PORTABLE CHEST 1 VIEW COMPARISON:  01/09/2019 FINDINGS: Cardiomediastinal silhouette unchanged in size and contour. Interval improvement in aeration bilateral lungs with they will opacities at the bases of the lungs. Opacity at the right lung base and left lung base obscuring the costophrenic angles and the hemidiaphragm. Mixed interstitial and airspace opacity of the right hilar and suprahilar region, slightly improved from the prior. IMPRESSION: Improving aeration in the right lung with persistent interstitial and airspace opacity of the hilar and suprahilar region. Bilateral pleural effusions with associated atelectasis/consolidation.  Electronically Signed  By: Corrie Mckusick D.O.   On: 01/10/2019 08:15        Scheduled Meds: . ALPRAZolam  0.5 mg Oral TID  . calcium acetate  1,334 mg Oral TID WC  . Chlorhexidine Gluconate Cloth  6 each Topical Q0600  . guaiFENesin  1,200 mg Oral BID  . heparin      . ipratropium-albuterol  3 mL Nebulization BID  . isosorbide dinitrate  10 mg Oral BID  . [START ON 01/12/2019] isosorbide dinitrate  20 mg Oral BID  . [START ON 01/12/2019] metoprolol succinate  50 mg Oral Daily  . metoprolol tartrate  25 mg Oral BID  . sertraline  100 mg Oral Daily  . sodium bicarbonate  650 mg Oral TID  . sodium chloride flush  3 mL Intravenous Q12H   Continuous Infusions: . sodium chloride 250 mL (01/11/19 1146)  . sodium chloride    . sodium chloride    . ceFEPime (MAXIPIME) IV 1 g (01/11/19 1148)  . ferric gluconate (FERRLECIT/NULECIT) IV 125 mg (01/11/19 1258)     LOS: 4 days    Time spent: 40 mins    Irine Seal, MD [facility]  If 7PM-7AM, please contact night-coverage [email or web address] 01/11/2019, 7:02 PM

## 2019-01-11 NOTE — Progress Notes (Signed)
Patient ID: Summer Cook, female   DOB: 1965-12-22, 53 y.o.   MRN: [id number] [facility] Progress Note   Assessment/ Plan:   1. Acute kidney Injury versus progressive chronic kidney disease: Nonoliguric and started on hemodialysis yesterday because of worsening azotemia and impending volume overload.  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  At this time, we are doing hemodialysis for management of azotemia and volume status and will continue to await potential renal recovery-if she does not have any improvement; will begin process for placement at outpatient dialysis unit for AKI.   2.  Acute hypoxic respiratory failure: Cleared by pulmonology-low pretest clinical suspicion for COVID-19 with negative testing. 3.  Left extrarenal perinephric hematoma: Status post renal biopsy; hemodynamically stable with downtrending hemoglobin/hematocrit.  Based on temperature curve/clinical findings, low index of suspicion that this converted to an abscess. 4.  Hyperphosphatemia: Secondary to advancing chronic kidney disease and acute kidney injury.  Continue calcium acetate for phosphorus control. 5.  Gap/non-gap metabolic acidosis: Secondary to acute kidney injury/advancing chronic kidney disease.  We will continue to follow labs and discontinue sodium bicarbonate when indicated.  Subjective:   Reports to be fatigued this morning from "busy day yesterday"   Objective:   BP 120/78   Pulse 92   Temp 98.7 F (37.1 C) (Oral)   Resp 14   Ht 5\' 5"  (1.651 m)   Wt 91 kg   LMP 08/13/2011   SpO2 95%   BMI 33.38 kg/m   Intake/Output Summary (Last 24 hours) at 01/11/2019 1107 Last data filed at 01/11/2019 0003 Gross per 24 hour  Intake -  Output 2000 ml  Net -2000 ml   Weight change: -1.2 kg  Physical Exam: Gen: Comfortably resting in bed, oxygen via nasal cannula CVS: Regular  rhythm, normal rate, S1 and S2 normal Resp: Decreased breath sounds of bilateral bases, no rales/rhonchi Abd: Soft, flat, nontender Ext: Trace lower extremity edema with SCDs  Imaging: US Renal  Result Date: 01/09/2019 CLINICAL DATA:  53 year old female with a history of medical renal disease. Prior biopsy 01/03/2019 EXAM: RENAL / URINARY TRACT ULTRASOUND COMPLETE COMPARISON:  CT 01/06/2019, ultrasound 01/04/2019 FINDINGS: Right Kidney: Length: 7.8 cm x 4.3 cm x 4.5 cm, 78 cc. No hydronephrosis. Echogenic cortex, similar to the prior. Flow confirmed in the hilum of the right kidney. Left Kidney: Length: 9.0 cm x 3.9 cm x 3.6 cm, 65 cc. No hydronephrosis. Similar echogenicity to the comparison, symmetric to the contralateral. No large perinephric fluid. Bladder: Appears normal for degree of bladder distention. Incidental right pleural effusion. IMPRESSION: No evidence of hydronephrosis. No large fluid collection adjacent to the left kidney. Right pleural effusion. Electronically Signed   By: Corrie Mckusick D.O.   On: 01/09/2019 19:48   Ir Fluoro Guide Cv Line Right  Result Date: 01/10/2019 INDICATION: 52 year old with acute kidney injury and needs a dialysis catheter. EXAM: FLUOROSCOPIC AND ULTRASOUND GUIDED PLACEMENT OF A TUNNELED DIALYSIS CATHETER Physician: Stephan Minister. Anselm Pancoast, MD MEDICATIONS: Vancomycin 1 gm IV; The antibiotic was administered within an appropriate time interval prior to skin puncture. ANESTHESIA/SEDATION: No sedation was given due to respiratory distress. Patient tolerated the procedure well with a non-rebreather. FLUOROSCOPY TIME:  Fluoroscopy Time: 36 seconds, 17 mGy COMPLICATIONS: None immediate. PROCEDURE: Informed consent was obtained for placement of a tunneled dialysis catheter. The patient was placed supine on the interventional table. Ultrasound confirmed a patent  right internal jugular vein. Ultrasound images were obtained for documentation. The right side of the neck and chest was  prepped and draped in a sterile fashion. The right side of the neck was anesthetized with 1% lidocaine. Maximal barrier sterile technique was utilized including caps, mask, sterile gowns, sterile gloves, sterile drape, hand hygiene and skin antiseptic. A small incision was made with #11 blade scalpel. A 21 gauge needle directed into the right internal jugular vein with ultrasound guidance. A micropuncture dilator set was placed. A 23 cm tip to cuff Palindrome catheter was selected. The skin below the right clavicle was anesthetized and a small incision was made with an #11 blade scalpel. A subcutaneous tunnel was formed to the vein dermatotomy site. The catheter was brought through the tunnel. The vein dermatotomy site was dilated to accommodate a peel-away sheath. The catheter was placed through the peel-away sheath and directed into the central venous structures. The tip of the catheter was placed in the upper right atrium with fluoroscopy. Fluoroscopic images were obtained for documentation. Both lumens were found to aspirate and flush well. The proper amount of heparin was flushed in both lumens. The vein dermatotomy site was closed using a single layer of absorbable suture and Dermabond. Gel-Foam was placed in the subcutaneous tract. The catheter was secured to the skin using Prolene suture. IMPRESSION: Successful placement of a right jugular tunneled dialysis catheter using ultrasound and fluoroscopic guidance. Electronically Signed   By: Markus Daft M.D.   On: 01/10/2019 15:19   Ir US Guide Vasc Access Right  Result Date: 01/10/2019 INDICATION: 80 year old with acute kidney injury and needs a dialysis catheter. EXAM: FLUOROSCOPIC AND ULTRASOUND GUIDED PLACEMENT OF A TUNNELED DIALYSIS CATHETER Physician: Stephan Minister. Anselm Pancoast, MD MEDICATIONS: Vancomycin 1 gm IV; The antibiotic was administered within an appropriate time interval prior to skin puncture. ANESTHESIA/SEDATION: No sedation was given due to respiratory  distress. Patient tolerated the procedure well with a non-rebreather. FLUOROSCOPY TIME:  Fluoroscopy Time: 36 seconds, 17 mGy COMPLICATIONS: None immediate. PROCEDURE: Informed consent was obtained for placement of a tunneled dialysis catheter. The patient was placed supine on the interventional table. Ultrasound confirmed a patent right internal jugular vein. Ultrasound images were obtained for documentation. The right side of the neck and chest was prepped and draped in a sterile fashion. The right side of the neck was anesthetized with 1% lidocaine. Maximal barrier sterile technique was utilized including caps, mask, sterile gowns, sterile gloves, sterile drape, hand hygiene and skin antiseptic. A small incision was made with #11 blade scalpel. A 21 gauge needle directed into the right internal jugular vein with ultrasound guidance. A micropuncture dilator set was placed. A 23 cm tip to cuff Palindrome catheter was selected. The skin below the right clavicle was anesthetized and a small incision was made with an #11 blade scalpel. A subcutaneous tunnel was formed to the vein dermatotomy site. The catheter was brought through the tunnel. The vein dermatotomy site was dilated to accommodate a peel-away sheath. The catheter was placed through the peel-away sheath and directed into the central venous structures. The tip of the catheter was placed in the upper right atrium with fluoroscopy. Fluoroscopic images were obtained for documentation. Both lumens were found to aspirate and flush well. The proper amount of heparin was flushed in both lumens. The vein dermatotomy site was closed using a single layer of absorbable suture and Dermabond. Gel-Foam was placed in the subcutaneous tract. The catheter was secured to the skin using Prolene suture.  IMPRESSION: Successful placement of a right jugular tunneled dialysis catheter using ultrasound and fluoroscopic guidance. Electronically Signed   By: Markus Daft M.D.   On:  01/10/2019 15:19   Dg Chest Port 1 View  Result Date: 01/10/2019 CLINICAL DATA:  53 year old female with shortness of breath and cough EXAM: PORTABLE CHEST 1 VIEW COMPARISON:  01/09/2019 FINDINGS: Cardiomediastinal silhouette unchanged in size and contour. Interval improvement in aeration bilateral lungs with they will opacities at the bases of the lungs. Opacity at the right lung base and left lung base obscuring the costophrenic angles and the hemidiaphragm. Mixed interstitial and airspace opacity of the right hilar and suprahilar region, slightly improved from the prior. IMPRESSION: Improving aeration in the right lung with persistent interstitial and airspace opacity of the hilar and suprahilar region. Bilateral pleural effusions with associated atelectasis/consolidation. Electronically Signed   By: Corrie Mckusick D.O.   On: 01/10/2019 08:15   Dg Chest Port 1 View  Result Date: 01/09/2019 CLINICAL DATA:  53 year old female with shortness of breath EXAM: PORTABLE CHEST 1 VIEW COMPARISON:  01/09/2019, 01/08/2019, 01/06/2019, CT chest 01/06/2019 FINDINGS: Cardiomediastinal silhouette is unchanged in size and contour. Heart borders partially obscured by overlying lung/pleural disease. Failed opacities at the bilateral lung bases, with dense opacities obscuring the bilateral hemidiaphragms and heart borders. Mixed interstitial and airspace opacities of the right hilar and suprahilar region extending superiorly. No pneumothorax.  Aeration maintained in the left upper lobe. IMPRESSION: Similar appearance of the chest x-ray, with right greater than left airspace/interstitial disease and bilateral pleural effusions with associated atelectasis/consolidation. Electronically Signed   By: Corrie Mckusick D.O.   On: 01/09/2019 17:02    Labs: BMET Recent Labs  Lab 01/06/19 2156 01/07/19 0827 01/08/19 0631 01/09/19 0729 01/10/19 0234 01/11/19 0258  NA 134* 136 135 134*  134* 134*  134* 135  135  K 4.1 4.1  4.4 4.8  4.8 4.3  4.4 3.6  3.5  CL 105 107 105 106  105 106  106 102  101  CO2 17* 17* 15* 15*  17* 14*  15* 20*  22  GLUCOSE 126* 91 95 88  89 89  92 113*  113*  BUN 74* 71* 70* 72*  73* 81*  81* 44*  44*  CREATININE 6.93* 6.82* 7.26* 7.50*  7.76* 7.76*  7.86* 5.31*  5.27*  CALCIUM 7.7* 7.8* 7.7* 8.0*  8.0* 8.1*  8.1* 8.0*  8.0*  PHOS  --   --  7.4* 8.2*  8.2* 8.4*  8.6* 5.3*   CBC Recent Labs  Lab 01/07/19 0827 01/08/19 0631 01/09/19 0729 01/10/19 0234 01/11/19 0258  WBC 8.8 10.0 9.6 10.2 11.5*  NEUTROABS 5.9  --  5.9 6.6 9.1*  HGB 8.8* 7.4* 6.9* 7.8* 8.4*  HCT 28.6* 23.4* 22.5* 24.6* 25.9*  MCV 91.7 90.0 89.6 89.1 87.5  PLT 190 168 149* 153 155    Medications:    . ALPRAZolam  0.5 mg Oral TID  . amLODipine  10 mg Oral Daily  . calcium acetate  1,334 mg Oral TID WC  . Chlorhexidine Gluconate Cloth  6 each Topical Q0600  . guaiFENesin  1,200 mg Oral BID  . ipratropium-albuterol  3 mL Nebulization BID  . isosorbide dinitrate  10 mg Oral BID  . metoprolol tartrate  25 mg Oral BID  . sertraline  100 mg Oral Daily  . sodium bicarbonate  650 mg Oral TID  . sodium chloride flush  3 mL Intravenous Q12H   Ulice Dash  Posey Pronto, MD 01/11/2019, 11:07 AM

## 2019-01-11 NOTE — Care Management Important Message (Signed)
Important Message  Patient Details  Name: Summer Cook MRN: [id number] Date of Birth: 18-Feb-1966   Medicare Important Message Given:  Yes  IM signed on 01/10/2019   Orbie Pyo 01/11/2019, 9:49 AM

## 2019-01-11 NOTE — Plan of Care (Signed)
  Problem: Health Behavior/Discharge Planning: Goal: Ability to manage health-related needs will improve Outcome: Progressing   Problem: Clinical Measurements: Goal: Ability to maintain clinical measurements within normal limits will improve Outcome: Progressing Goal: Will remain free from infection Outcome: Progressing Goal: Diagnostic test results will improve Outcome: Progressing Goal: Respiratory complications will improve Outcome: Progressing Goal: Cardiovascular complication will be avoided Outcome: Progressing   Problem: Activity: Goal: Risk for activity intolerance will decrease Outcome: Progressing   Problem: Nutrition: Goal: Adequate nutrition will be maintained Outcome: Progressing   Problem: Coping: Goal: Level of anxiety will decrease Outcome: Progressing   Problem: Safety: Goal: Ability to remain free from injury will improve Outcome: Progressing   Problem: Skin Integrity: Goal: Risk for impaired skin integrity will decrease Outcome: Progressing   

## 2019-01-11 NOTE — Progress Notes (Signed)
Pt arrived back to room from dialysis via bed and transporter. Notified telemetry pt is back in her room, telemetry verified pt was showing up on PC monitor. Pt resting comfortably at this time. Will continue to monitor pt. Ranelle Oyster, RN

## 2019-01-12 ENCOUNTER — Inpatient Hospital Stay (HOSPITAL_COMMUNITY): Payer: Medicare Other

## 2019-01-12 ENCOUNTER — Encounter (HOSPITAL_COMMUNITY): Payer: Self-pay | Admitting: Nephrology

## 2019-01-12 DIAGNOSIS — N170 Acute kidney failure with tubular necrosis: Principal | ICD-10-CM

## 2019-01-12 DIAGNOSIS — I5023 Acute on chronic systolic (congestive) heart failure: Secondary | ICD-10-CM

## 2019-01-12 DIAGNOSIS — N183 Chronic kidney disease, stage 3 (moderate): Secondary | ICD-10-CM

## 2019-01-12 DIAGNOSIS — I2510 Atherosclerotic heart disease of native coronary artery without angina pectoris: Secondary | ICD-10-CM

## 2019-01-12 LAB — RENAL FUNCTION PANEL
Albumin: 2.1 g/dL — ABNORMAL LOW (ref 3.5–5.0)
Anion gap: 14 (ref 5–15)
BUN: 24 mg/dL — ABNORMAL HIGH (ref 6–20)
CO2: 22 mmol/L (ref 22–32)
Calcium: 8 mg/dL — ABNORMAL LOW (ref 8.9–10.3)
Chloride: 102 mmol/L (ref 98–111)
Creatinine, Ser: 4.04 mg/dL — ABNORMAL HIGH (ref 0.44–1.00)
GFR calc Af Amer: 14 mL/min — ABNORMAL LOW (ref >60)
GFR calc non Af Amer: 12 mL/min — ABNORMAL LOW (ref >60)
Glucose, Bld: 84 mg/dL (ref 70–99)
Phosphorus: 4.5 mg/dL (ref 2.5–4.6)
Potassium: 4 mmol/L (ref 3.5–5.1)
Sodium: 138 mmol/L (ref 135–145)

## 2019-01-12 LAB — CBC WITH DIFFERENTIAL/PLATELET
Abs Immature Granulocytes: 0.02 10*3/uL (ref 0.00–0.07)
Basophils Absolute: 0.1 10*3/uL (ref 0.0–0.1)
Basophils Relative: 1 %
Eosinophils Absolute: 0.3 10*3/uL (ref 0.0–0.5)
Eosinophils Relative: 4 %
HCT: 24.9 % — ABNORMAL LOW (ref 36.0–46.0)
Hemoglobin: 8 g/dL — ABNORMAL LOW (ref 12.0–15.0)
Immature Granulocytes: 0 %
Lymphocytes Relative: 17 %
Lymphs Abs: 1.3 10*3/uL (ref 0.7–4.0)
MCH: 28.4 pg (ref 26.0–34.0)
MCHC: 32.1 g/dL (ref 30.0–36.0)
MCV: 88.3 fL (ref 80.0–100.0)
Monocytes Absolute: 1 10*3/uL (ref 0.1–1.0)
Monocytes Relative: 13 %
Neutro Abs: 5 10*3/uL (ref 1.7–7.7)
Neutrophils Relative %: 65 %
Platelets: 159 10*3/uL (ref 150–400)
RBC: 2.82 MIL/uL — ABNORMAL LOW (ref 3.87–5.11)
RDW: 14.7 % (ref 11.5–15.5)
WBC: 7.6 10*3/uL (ref 4.0–10.5)
nRBC: 0 % (ref 0.0–0.2)

## 2019-01-12 LAB — NM MYOCAR MULTI W/SPECT W/WALL MOTION / EF
Estimated workload: 1 METS
Exercise duration (min): 0 min
Exercise duration (sec): 0 s
MPHR: 167 {beats}/min
Peak HR: 118 {beats}/min
Percent HR: 70 %
Rest HR: 93 {beats}/min

## 2019-01-12 LAB — CK: Total CK: 25 U/L — ABNORMAL LOW (ref 38–234)

## 2019-01-12 LAB — MAGNESIUM: Magnesium: 1.8 mg/dL (ref 1.7–2.4)

## 2019-01-12 LAB — HEPATITIS B SURFACE ANTIBODY,QUALITATIVE: Hep B S Ab: NONREACTIVE

## 2019-01-12 LAB — C-REACTIVE PROTEIN: CRP: 21.6 mg/dL — ABNORMAL HIGH (ref <1.0)

## 2019-01-12 MED ORDER — ONDANSETRON HCL 4 MG/2ML IJ SOLN
4.0000 mg | Freq: Once | INTRAMUSCULAR | Status: AC
  Administered 2019-01-12: 12:00:00 4 mg via INTRAVENOUS

## 2019-01-12 MED ORDER — HYDRALAZINE HCL 25 MG PO TABS
25.0000 mg | ORAL_TABLET | Freq: Three times a day (TID) | ORAL | Status: DC
  Administered 2019-01-12 – 2019-01-19 (×16): 25 mg via ORAL
  Filled 2019-01-12 (×16): qty 1

## 2019-01-12 MED ORDER — TECHNETIUM TC 99M TETROFOSMIN IV KIT
10.0000 | PACK | Freq: Once | INTRAVENOUS | Status: AC | PRN
  Administered 2019-01-12: 10 via INTRAVENOUS

## 2019-01-12 MED ORDER — ORAL CARE MOUTH RINSE
15.0000 mL | Freq: Two times a day (BID) | OROMUCOSAL | Status: DC
  Administered 2019-01-16: 7 mL via OROMUCOSAL
  Administered 2019-01-17 – 2019-01-20 (×5): 15 mL via OROMUCOSAL

## 2019-01-12 MED ORDER — REGADENOSON 0.4 MG/5ML IV SOLN
INTRAVENOUS | Status: AC
  Filled 2019-01-12: qty 5

## 2019-01-12 MED ORDER — REGADENOSON 0.4 MG/5ML IV SOLN
0.4000 mg | Freq: Once | INTRAVENOUS | Status: AC
  Administered 2019-01-12: 0.4 mg via INTRAVENOUS
  Filled 2019-01-12: qty 5

## 2019-01-12 MED ORDER — TECHNETIUM TC 99M TETROFOSMIN IV KIT
30.0000 | PACK | Freq: Once | INTRAVENOUS | Status: AC | PRN
  Administered 2019-01-12: 30 via INTRAVENOUS

## 2019-01-12 MED ORDER — HEPARIN SODIUM (PORCINE) 1000 UNIT/ML IJ SOLN
INTRAMUSCULAR | Status: AC
  Administered 2019-01-12: 1000 [IU]
  Filled 2019-01-12: qty 4

## 2019-01-12 MED ORDER — ONDANSETRON HCL 4 MG/2ML IJ SOLN
INTRAMUSCULAR | Status: AC
  Administered 2019-01-12: 4 mg via INTRAVENOUS
  Filled 2019-01-12: qty 2

## 2019-01-12 MED ORDER — MAGNESIUM SULFATE 2 GM/50ML IV SOLN
2.0000 g | Freq: Once | INTRAVENOUS | Status: AC
  Administered 2019-01-12: 19:00:00 2 g via INTRAVENOUS
  Filled 2019-01-12: qty 50

## 2019-01-12 NOTE — Progress Notes (Signed)
Lexiscan complete.  See charted vitals.  Pt experienced N/V after admin of lexiscan.  Order received for zofran from [place]. PA.  Zofran admin.  Pt no longer N/V, pt states she is feeling better

## 2019-01-12 NOTE — Progress Notes (Signed)
Progress Note  Patient Name: Summer Cook Date of Encounter: 01/12/2019  Primary Cardiologist: Rozann Lesches, MD   Subjective   Fatigue, no chest pain or SOB.  Inpatient Medications    Scheduled Meds: . ALPRAZolam  0.5 mg Oral TID  . calcium acetate  1,334 mg Oral TID WC  . Chlorhexidine Gluconate Cloth  6 each Topical Q0600  . guaiFENesin  1,200 mg Oral BID  . ipratropium-albuterol  3 mL Nebulization BID  . isosorbide dinitrate  20 mg Oral BID  . metoprolol succinate  50 mg Oral Daily  . regadenoson      . sertraline  100 mg Oral Daily  . sodium bicarbonate  650 mg Oral TID  . sodium chloride flush  3 mL Intravenous Q12H   Continuous Infusions: . sodium chloride 250 mL (01/11/19 1146)  . ceFEPime (MAXIPIME) IV 1 g (01/11/19 1148)  . ferric gluconate (FERRLECIT/NULECIT) IV 125 mg (01/11/19 1258)  . magnesium sulfate 1 - 4 g bolus IVPB     PRN Meds: sodium chloride, albuterol, chlorpheniramine-HYDROcodone, HYDROmorphone (DILAUDID) injection, ipratropium-albuterol, oxyCODONE, sodium chloride flush   Vital Signs    Vitals:   01/12/19 1154 01/12/19 1156 01/12/19 1158 01/12/19 1345  BP: 137/89 (!) 149/91 (!) 152/93   Pulse:      Resp:      Temp:    98.1 F (36.7 C)  TempSrc:    Oral  SpO2:      Weight:      Height:        Intake/Output Summary (Last 24 hours) at 01/12/2019 1445 Last data filed at 01/12/2019 1259 Gross per 24 hour  Intake 0 ml  Output 1856 ml  Net -1856 ml   Last 3 Weights 01/11/2019 01/11/2019 01/11/2019  Weight (lbs) 195 lb 1.7 oz 200 lb 9.9 oz 199 lb 11.8 oz  Weight (kg) 88.5 kg 91 kg 90.6 kg      Telemetry    SR- Personally Reviewed  ECG    No new tracing - Personally Reviewed  Physical Exam   GEN: No acute distress. Ill appearing Neck: No JVD Cardiac: RRR, no murmurs, rubs, or gallops.  Respiratory: Decreased breathing sounds at bases bilaterally, no wheezing, rhonchi or rales  GI: Soft, nontender, non-distended  MS: No  edema; No deformity. Neuro:  Nonfocal  Psych: Normal affect   Labs    Chemistry Recent Labs  Lab 01/09/19 0729 01/10/19 0234 01/11/19 0258 01/12/19 0418  NA 134*  134* 134*  134* 135  135 138  K 4.8  4.8 4.3  4.4 3.6  3.5 4.0  CL 106  105 106  106 102  101 102  CO2 15*  17* 14*  15* 20*  22 22  GLUCOSE 88  89 89  92 113*  113* 84  BUN 72*  73* 81*  81* 44*  44* 24*  CREATININE 7.50*  7.76* 7.76*  7.86* 5.31*  5.27* 4.04*  CALCIUM 8.0*  8.0* 8.1*  8.1* 8.0*  8.0* 8.0*  PROT 5.1* 5.4* 5.7*  --   ALBUMIN 2.4*  2.4* 2.3*  2.3* 2.4*  2.4* 2.1*  AST 11* 12* 15  --   ALT 7 8 9   --   ALKPHOS 49 45 56  --   BILITOT 0.8 1.1 1.1  --   GFRNONAA 6*  5* 5*  5* 9*  9* 12*  GFRAA 7*  6* 6*  6* 10*  10* 14*  ANIONGAP 13  12 14  13 13  12 14      Hematology Recent Labs  Lab 01/10/19 0234 01/11/19 0258 01/12/19 0418  WBC 10.2 11.5* 7.6  RBC 2.76* 2.96* 2.82*  HGB 7.8* 8.4* 8.0*  HCT 24.6* 25.9* 24.9*  MCV 89.1 87.5 88.3  MCH 28.3 28.4 28.4  MCHC 31.7 32.4 32.1  RDW 15.1 14.8 14.7  PLT 153 155 159    Cardiac Enzymes Recent Labs  Lab 01/07/19 1119 01/07/19 1835 01/07/19 2255 01/10/19 0234  TROPONINI 0.07* 0.07* 0.06* 0.05*   No results for input(s): TROPIPOC in the last 168 hours.   BNPNo results for input(s): BNP, PROBNP in the last 168 hours.   DDimer  Recent Labs  Lab 01/08/19 0631 01/09/19 0729 01/10/19 0234  DDIMER 1.87* 1.83* 2.86*     Radiology    No results found.  Cardiac Studies     Patient Profile     53 y.o. female female with a history of CAD s/p NSTEMI with totally occluded of distal PDA in 2018 (medically managed), hypertension, hyperlipidemia, CVA, ITP s/p splenectomy, steroid induced diabetes mellitus, prior PE, who is being seen today for the evaluation of acute systolic CHF at the request of Dr. Grandville Silos.  Assessment & Plan    Acute Combine CHF - Echo this admission showed newly reduced LVEF of  35-40% (normal on Echo in 02/2017) with diffuse hypokinesis. Likely secondary sepsis and new acute anuric kidney failure.  - I have personally reviewed her stress test and there is no ischemia. - Continue daily dialysis/volume management per Nephrology. - We will focus on BP management.Continue Toprol-XL and Isordil. Unable to use ACEi/ARB/Entresto/Spironolactone given renal function. - Add hydralazine 25 mg po TID - Continue to monitor daily weight, strict I/O's, and renal function.  Hypertension - BP has significantly improved from previous admission where she presented with hypertensive urgency. She was discharged on Amlodipine 10mg  daily, Lopressor 25mg  twice daily, and Isordil 10mg  twice daily. BP has been well controlled over the past 24 hours.  - Amlodipine was discontinued yesterday given LV systolic dysfunction - Lopressor was switched to Toprol-XL 50mg  daily starting this morning. - Isordil increased to 20mg  twice daily starting today. - Further titration of medication pending BP response to above.  CAD - Patient has known CAD with history of NSTEMI with totally occluded distal PDA on cardiac catheterization in 04/2017 (managmed medically). - Troponin minimally elevated and flat at 0.09 >> 0.07 >> 0.07 >> 0.06 >> 0.05. Not consistent with ACS. May be secondary to kidney disease. - Patient admits to new worsening chest pain. - Continue aspirin and beta-blocker. - Unclear why patient is not on a statin. Recommend starting Lipitor 40mg  daily. - Lexiscan Myoview today. Results pending.  Acute on Chronic Kidney Disease - With anuria requiring hemodialysis. 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 Patient presented 12/30/2018 with a serum creatinine of 6.88 (baseline ~1.5). Underwent a renal biopsy which was concerning for TMA 2/2 poorly controlled HTN. Unfortunately creatinine continued to worsen and patient underwent tunneled catheter placement 01/09/2019 and was  started on dialysis for management of uremia and volume overload. Creatinine improved to 4.04 today.  - On antibiotics for possible infection. - Continue to avoid nephrotoxic agents. - Continue management per Nephrology/primary team.  Otherwise, per primary team. [facility] will sign off.   Medication Recommendations:  As above  Other recommendations (labs, testing, etc):  No further cardiac testing Follow up as an outpatient:  We will arrange  For questions or  updates, please contact [place] Please consult [email or web address] for contact info under    Signed, Ena Dawley, MD  01/12/2019, 2:45 PM

## 2019-01-12 NOTE — Consult Note (Signed)
   [facility] CM Inpatient Consult   01/12/2019  Summer Cook 1966/04/11 [id number]   Patient screened for high risk score for unplanned readmissions [23%] and ED,  hospitalizations to check if potential [facility] services for restart of services.  Patient had been outreached by [facility] Coordinator staff in the past and [facility] social worker for resources.  Patient with [employer].  Chart review reveals per Jani Gravel MD notes from current History and Physical:  Summer Cook  is a 53 y.o. female, w with a history of hypertension, CAD, GERD, ITP status post splenectomy, history of PE in 2013, w CKD stage3, and recent AKI (suspect cipro induced AIN r/o AGN) w kidney biopsy on 01/03/2019 apparently has had left flank pain since discharge.  Pt  apparently contacted nephrology and was told to present to ED. Pt states has had fever to 103, and slight productive cough.  Unclear if yellow? Sputum.  Pt denies cp, palp, sob, n/v, abd pain,  diarrhea, brbpr, black stool, dysuria, hematuria.   Brief review of reveals also Influenza A and B negative; respiratory panel PCR is negative. COVID-19 testing has been negative.   Will follow for progress and disposition needs and with [facility] team regarding needs.  Please place a [facility] Care Management consult or for questions contact:   Natividad Brood, RN BSN [facility] Liaison  [phone] business mobile phone Toll free office [phone]

## 2019-01-12 NOTE — Progress Notes (Signed)
   Patient presented for a nuclear stress test today. Patient became nauseous and started vomiting so Zofran was given. However, no significant  immediate complications. Stress imaging is pending at this time.   Preliminary EKG findings may be listed in the chart, but the stress test result will not be finalized until perfusion imaging is complete.  Darreld Mclean, PA-C 01/12/2019 12:18 PM

## 2019-01-12 NOTE — Progress Notes (Signed)
Pharmacy Antibiotic Note  Summer Cook is a 53 y.o. female admitted on 01/06/2019 with concern for COVID/HCAP. Given CTX x1 on admit.  Pharmacy has been consulted for cefepime dosing for possible perinephric abscess. Patient is currently day 6 of a 7-10 day course per MD. WBC 7.6 CrCl <10 receiving HD on 4/15. Blood Cultures NG final from 4/10.  Plan: Continue cefepime 1000 mg q24h Follow cultures, renal function  Height: 5\' 5"  (165.1 cm) Weight: 195 lb 1.7 oz (88.5 kg) IBW/kg (Calculated) : 57  Temp (24hrs), Avg:98.6 F (37 C), Min:98.3 F (36.8 C), Max:99.2 F (37.3 C)  Recent Labs  Lab 01/06/19 2156  01/08/19 0631 01/09/19 0729 01/10/19 0234 01/11/19 0258 01/12/19 0418  WBC 7.8   < > 10.0 9.6 10.2 11.5* 7.6  CREATININE 6.93*   < > 7.26* 7.50*  7.76* 7.76*  7.86* 5.31*  5.27* 4.04*  LATICACIDVEN 0.8  --   --   --   --   --   --    < > = values in this interval not displayed.    Estimated Creatinine Clearance: 17.7 mL/min (A) (by C-G formula based on SCr of 4.04 mg/dL (H)).    Allergies  Allergen Reactions  . Yellow Jacket Venom Swelling    Swelling at injection site  . Adhesive [Tape] Rash    Please use paper tape  . Brintellix [Vortioxetine] Itching  . Cymbalta [Duloxetine Hcl] Itching  . Penicillins Itching    Did it involve swelling of the face/tongue/throat, SOB, or low BP? No Did it involve sudden or severe rash/hives, skin peeling, or any reaction on the inside of your mouth or nose? No Did you need to seek medical attention at a hospital or doctor's office? No When did it last happen?53 yrs old If all above answers are "NO", may proceed with cephalosporin use.    Antimicrobials this admission: 4/11 CTX >> 4/11 4/11 Cefepime >>   Microbiology results: 4/11 BCx: NG 4/11 RVP pending 4/4 UCx Neg 4/10 BCx NG  Anshika Pethtel A. Levada Dy, PharmD, [place] Please utilize Amion for appropriate phone number to reach the unit  pharmacist ([place])   01/12/2019 9:54 AM

## 2019-01-12 NOTE — Progress Notes (Signed)
PROGRESS NOTE    Summer Cook  HFW:[id number] DOB: 1966/02/26 DOA: 01/06/2019 PCP: Redmond School, MD    Brief Narrative:  53 year old female with history of hypertension, CAD, GERD, ITP status post splenectomy, PE in 2013, chronic kidney disease stage III and recent AKI on CKD who underwent kidney biopsy on 01/03/2019 with subsequent left flank pain since discharge associated with fever and cough.  In the ED, CT scan of the chest abdomen and pelvis showed cardiomegaly with bilateral pleural effusions and findings consistent with pulmonary edema along with patchy bilateral groundglass opacities in both lungs.  CT scan of the abdomen and pelvis showed a recent left renal biopsy with moderate sized left extrarenal perinephric hematoma with a small foci of air within the hematoma raising question of superimposed infection.  She was admitted for the same.  Nephrology was consulted.  During the hospitalization, her respiratory status worsened.  PCCM was also consulted.  COVID-19 was tested which turned out negative.  Initially she was kept on isolation but after discussion with ID, droplet and contact precautions were discontinued.    Assessment & Plan:   Principal Problem:   Acute renal failure superimposed on stage 3 chronic kidney disease (HCC) Active Problems:   Acute respiratory failure with hypoxia (HCC)   Acute on chronic systolic congestive heart failure (HCC)   Essential hypertension   Stage 3 chronic kidney disease (HCC)   Diabetes mellitus type 2, uncomplicated (HCC)   CAD (coronary artery disease): total distal RCA EF normal (05/20/17)   Fever   CKD (chronic kidney disease), stage V (HCC)   Cough   Renal hematoma, left   Iron deficiency anemia   #1 acute renal failure on chronic kidney disease stage III versus progressive chronic kidney disease Patient noted to go into worsening acute kidney injury and nonoliguric seen by nephrology and subsequently started on hemodialysis due  to worsening renal function and worsening volume overload.  Patient underwent renal biopsy on 01/03/2019 which was felt to be associated with hypertension with moderate scarring with a poor long-term prognosis.  Patient receiving hemodialysis due to worsening renal function and volume overload.  Per nephrology.  2.  Acute hypoxic respiratory failure secondary to acute systolic heart failure likely secondary to acute renal failure Patient noted to have a worsening acute hypoxic respiratory failure felt likely secondary to volume overload secondary to acute renal failure.  Patient was seen by pulmonary and felt to have a low pretest clinical suspicion for COVID-19 with a negative test.  Patient with some clinical improvement after being started on hemodialysis.  2D echo with a EF of 35 to 40% with left ventricular diffuse hypokinesis.  Patient noted to have a normal EF on 05/21/2017.  Patient currently receiving hemodialysis with 1.5 L of fluid removed yesterday.  Due to depressed ejection fraction cardiology has been consulted.  Patient underwent a Lexiscan Myoview study today with results pending.  Nephrology following.  3.  Fever /left extrarenal perinephric hematoma Status post recent renal biopsy.  Patient hemodynamically stable.  CT abdomen and pelvis which was done was concerning for perinephric hematoma with question for superimposed infection.  Patient currently afebrile.  Influenza PCR negative.  Respiratory viral panel negative.  COVID-19 testing negative.  Due to worsening respiratory function and due to concern for COVID-19 patient received a dose of hydroxychloroquine which was subsequently discontinued after tests came back negative.  Case was discussed with ID and by prior hospitalist who recommended discontinuation of airborne and droplet precautions.  Patient on empiric IV antibiotics of cefepime. White count improved and currently at 7.6..  If patient spikes fevers may need to repeat CT abdomen  and pelvis to rule out abscess formation. Per nephrology.  Continue IV cefepime to complete a 7-day course of antibiotic treatment.  Follow.  4.  Iron deficiency anemia Iron level of 12.  TIBC of 249.  Patient receiving IV Nulecit per nephrology.  Status post 1 unit packed red blood cells.  Hemoglobin currently at 8.0 from 8.4 from 6.9.  Follow H&H.  5.  Metabolic acidosis Secondary to acute kidney injury versus progressive chronic kidney disease.  Patient currently on bicarb tablets.  Per nephrology.  6.  Coronary artery disease status post totally occluded PDA Medically managed prior to admission.  2D echo done with a depressed ejection fraction of 35 to 40% which is worse than prior 2D echo.  Aspirin on hold due to hematoma..  Continue isosorbide.  Patient on Toprol XL and hydralazine added per cardiology.  Patient underwent Lexiscan Myoview study today with results pending.  Cardiology recommending patient be started on Lipitor 40 mg daily.  Cardiology following and appreciate input and recommendations.  7.  Hypertension Blood pressure medications adjusted per cardiology.  Norvasc has been discontinued.  Patient currently on Toprol-XL, hydralazine, Isordil.   8.  Obesity Outpatient follow-up.  DVT prophylaxis: SCD Code Status: Full Family Communication: Updated patient.  No family at bedside. Disposition Plan: To be determined.   Consultants:   Nephrology: Dr. Justin Mend 01/07/2019  PCCM Dr. Lynetta Mare 01/09/2019  Interventional radiology: Dr. Anselm Pancoast 01/10/2019  Cardiology: Dr. Meda Coffee 01/11/2019  Procedures:  CT chest 01/06/2019  CT renal stone protocol for 07/18/2019  Chest x-ray 01/06/2019, 01/08/2019, 01/09/2019, 01/10/2019  Upper scopic and ultrasound-guided placement of tunneled dialysis catheter per Dr. Anselm Pancoast 01/10/2019  Renal ultrasound 01/09/2019  2D echo 01/10/2019  Transfuse 1 unit packed red blood cells 01/09/2019  Lexiscan Myoview pending 01/12/2019  Antimicrobials:   IV  cefepime 01/07/2019 >>>>   IV vancomycin 01/10/2019 x 1 dose    Subjective: Patient seen in nuclear medicine.  Denies any significant chest pain.  Denies any shortness of breath.  Awaiting to get back to her room after Lexiscan stress test.   Objective: Vitals:   01/12/19 1630 01/12/19 1700 01/12/19 1730 01/12/19 1800  BP: 125/68 115/67 109/77 121/80  Pulse: 95 93 71 90  Resp:      Temp:      TempSrc:      SpO2:      Weight:      Height:        Intake/Output Summary (Last 24 hours) at 01/12/2019 1803 Last data filed at 01/12/2019 1259 Gross per 24 hour  Intake 0 ml  Output 350 ml  Net -350 ml   Filed Weights   01/11/19 0634 01/11/19 1710 01/12/19 1433  Weight: 91 kg 88.5 kg 89.1 kg    Examination:  General exam: NAD Respiratory system: Lungs clear to auscultation bilaterally anterior lung fields.  No wheezing noted.  No crackles. Normal respiratory effort. Cardiovascular system: Regular rate rhythm no murmurs rubs or gallops.  No JVD.  Trace lower extremity edema.  Gastrointestinal system: Abdomen is soft, nontender, nondistended, positive bowel sounds.  No rebound.  No guarding. Central nervous system: Alert. No focal neurological deficits. Extremities: Trace lower extremity edema.  Symmetric 5 x 5 power. Skin: No rashes, lesions or ulcers Psychiatry: Judgement and insight appear fair. Mood & affect appropriate.     Data Reviewed:  I have personally reviewed following labs and imaging studies  CBC: Recent Labs  Lab 01/07/19 0827 01/08/19 0631 01/09/19 0729 01/10/19 0234 01/11/19 0258 01/12/19 0418  WBC 8.8 10.0 9.6 10.2 11.5* 7.6  NEUTROABS 5.9  --  5.9 6.6 9.1* 5.0  HGB 8.8* 7.4* 6.9* 7.8* 8.4* 8.0*  HCT 28.6* 23.4* 22.5* 24.6* 25.9* 24.9*  MCV 91.7 90.0 89.6 89.1 87.5 88.3  PLT 190 168 149* 153 155 169   Basic Metabolic Panel: Recent Labs  Lab 01/08/19 0631 01/09/19 0729 01/10/19 0234 01/11/19 0258 01/12/19 0418  NA 135 134*  134* 134*  134*  135  135 138  K 4.4 4.8  4.8 4.3  4.4 3.6  3.5 4.0  CL 105 106  105 106  106 102  101 102  CO2 15* 15*  17* 14*  15* 20*  22 22  GLUCOSE 95 88  89 89  92 113*  113* 84  BUN 70* 72*  73* 81*  81* 44*  44* 24*  CREATININE 7.26* 7.50*  7.76* 7.76*  7.86* 5.31*  5.27* 4.04*  CALCIUM 7.7* 8.0*  8.0* 8.1*  8.1* 8.0*  8.0* 8.0*  MG 2.0 2.0 2.0 2.0 1.8  PHOS 7.4* 8.2*  8.2* 8.4*  8.6* 5.3* 4.5   GFR: Estimated Creatinine Clearance: 17.7 mL/min (A) (by C-G formula based on SCr of 4.04 mg/dL (H)). Liver Function Tests: Recent Labs  Lab 01/07/19 0827 01/08/19 0631 01/09/19 0729 01/10/19 0234 01/11/19 0258 01/12/19 0418  AST 16 12* 11* 12* 15  --   ALT 11 10 7 8 9   --   ALKPHOS 60 49 49 45 56  --   BILITOT 0.7 0.7 0.8 1.1 1.1  --   PROT 6.7 5.2* 5.1* 5.4* 5.7*  --   ALBUMIN 3.2* 2.4*  2.4* 2.4*  2.4* 2.3*  2.3* 2.4*  2.4* 2.1*   No results for input(s): LIPASE, AMYLASE in the last 168 hours. No results for input(s): AMMONIA in the last 168 hours. Coagulation Profile: No results for input(s): INR, PROTIME in the last 168 hours. Cardiac Enzymes: Recent Labs  Lab 01/07/19 0827  01/07/19 1119 01/07/19 1835 01/07/19 2255 01/08/19 0631 01/09/19 0729 01/10/19 0234 01/11/19 0258 01/12/19 0418  CKTOTAL  --    < > 34*  --   --  27* 33* 28* 36* 25*  TROPONINI 0.09*  --  0.07* 0.07* 0.06*  --   --  0.05*  --   --    < > = values in this interval not displayed.   BNP (last 3 results) No results for input(s): PROBNP in the last 8760 hours. HbA1C: No results for input(s): HGBA1C in the last 72 hours. CBG: No results for input(s): GLUCAP in the last 168 hours. Lipid Profile: Recent Labs    01/10/19 0234  TRIG 112   Thyroid Function Tests: No results for input(s): TSH, T4TOTAL, FREET4, T3FREE, THYROIDAB in the last 72 hours. Anemia Panel: Recent Labs    01/10/19 0234  FERRITIN 73*   Sepsis Labs: Recent Labs  Lab 01/06/19 2156 01/07/19 0827   PROCALCITON  --  <0.10  LATICACIDVEN 0.8  --     Recent Results (from the past 240 hour(s))  Blood Culture (routine x 2)     Status: None   Collection Time: 01/06/19  9:56 PM  Result Value Ref Range Status   Specimen Description RIGHT ANTECUBITAL  Final   Special Requests   Final    BOTTLES DRAWN  AEROBIC AND ANAEROBIC Blood Culture adequate volume   Culture   Final    NO GROWTH 5 DAYS Performed at [facility], [address] [place], [place] [address]    Report Status 01/11/2019 FINAL  Final  Blood Culture (routine x 2)     Status: None   Collection Time: 01/06/19 10:00 PM  Result Value Ref Range Status   Specimen Description LEFT ANTECUBITAL  Final   Special Requests   Final    BOTTLES DRAWN AEROBIC AND ANAEROBIC Blood Culture adequate volume   Culture   Final    NO GROWTH 5 DAYS Performed at [facility], [address] [place], [place] [address]    Report Status 01/11/2019 FINAL  Final  Respiratory Panel by PCR     Status: None   Collection Time: 01/07/19  7:20 AM  Result Value Ref Range Status   Adenovirus NOT DETECTED NOT DETECTED Final   Coronavirus 229E NOT DETECTED NOT DETECTED Final    Comment: (NOTE) The Coronavirus on the Respiratory Panel, DOES NOT test for the novel  Coronavirus (2019 nCoV)    Coronavirus HKU1 NOT DETECTED NOT DETECTED Final   Coronavirus NL63 NOT DETECTED NOT DETECTED Final   Coronavirus OC43 NOT DETECTED NOT DETECTED Final   Metapneumovirus NOT DETECTED NOT DETECTED Final   Rhinovirus / Enterovirus NOT DETECTED NOT DETECTED Final   Influenza A NOT DETECTED NOT DETECTED Final   Influenza B NOT DETECTED NOT DETECTED Final   Parainfluenza Virus 1 NOT DETECTED NOT DETECTED Final   Parainfluenza Virus 2 NOT DETECTED NOT DETECTED Final   Parainfluenza Virus 3 NOT DETECTED NOT DETECTED Final   Parainfluenza Virus 4 NOT DETECTED NOT DETECTED Final   Respiratory Syncytial Virus NOT DETECTED NOT DETECTED Final   Bordetella pertussis NOT  DETECTED NOT DETECTED Final   Chlamydophila pneumoniae NOT DETECTED NOT DETECTED Final   Mycoplasma pneumoniae NOT DETECTED NOT DETECTED Final    Comment: Performed at [facility], [place] [address] [place], [place] [address]  Novel Coronavirus, NAA (hospital order; send-out to ref lab)     Status: None   Collection Time: 01/07/19  7:30 AM  Result Value Ref Range Status   SARS-CoV-2, NAA NOT DETECTED NOT DETECTED Final    Comment: Performed at [facility] Clinical Labs Performed at [facility], [place] [address], [place], [place] [address]    Coronavirus Source NASOPHARYNGEAL  Final    Comment: Performed at [facility], [address] [place], [place] [address]         Radiology Studies: Nm Myocar Multi W/spect W/wall Motion / Ef  Result Date: 01/12/2019  Defect 1: There is a small defect present in the apical anterior location.  Findings consistent with prior myocardial infarction.  This is an intermediate risk study.  The left ventricular ejection fraction is moderately decreased (30-44%).  There is a small area of irreversible defect in the apical anterior wall consistent with prior infarct and no ischemia. LVEF 37% with diffuse hypokinesis.        Scheduled Meds: . ALPRAZolam  0.5 mg Oral TID  . calcium acetate  1,334 mg Oral TID WC  . Chlorhexidine Gluconate Cloth  6 each Topical Q0600  . guaiFENesin  1,200 mg Oral BID  . heparin      . hydrALAZINE  25 mg Oral TID  . ipratropium-albuterol  3 mL Nebulization BID  . isosorbide dinitrate  20 mg Oral BID  . metoprolol succinate  50 mg  Oral Daily  . regadenoson      . sertraline  100 mg Oral Daily  . sodium bicarbonate  650 mg Oral TID  . sodium chloride flush  3 mL Intravenous Q12H   Continuous Infusions: . sodium chloride 250 mL (01/11/19 1146)  . ceFEPime (MAXIPIME) IV 1 g (01/11/19 1148)  . ferric gluconate (FERRLECIT/NULECIT) IV 125 mg (01/11/19 1258)  . magnesium sulfate 1 - 4 g bolus IVPB       LOS: 5  days    Time spent: 40 mins    Irine Seal, MD [facility]  If 7PM-7AM, please contact night-coverage [email or web address] 01/12/2019, 6:03 PM

## 2019-01-12 NOTE — Procedures (Signed)
Patient was seen on dialysis and the procedure was supervised.  BFR 300  Via RID TDC BP is  124/74. 3K bath. Patient appears to be tolerating treatment well. The nuclear cardiac stress test negative.   Summer Cook 01/12/2019

## 2019-01-12 NOTE — Progress Notes (Deleted)
Progress Note  Patient Name: Summer Cook Date of Encounter: 01/12/2019  Primary Cardiologist: Rozann Lesches, MD   Subjective   No significant overnight events. Saw patient down in nuclear medicine. Very tired but no chest pain or shortness of breath.  Inpatient Medications    Scheduled Meds:  ALPRAZolam  0.5 mg Oral TID   calcium acetate  1,334 mg Oral TID WC   Chlorhexidine Gluconate Cloth  6 each Topical Q0600   guaiFENesin  1,200 mg Oral BID   ipratropium-albuterol  3 mL Nebulization BID   isosorbide dinitrate  20 mg Oral BID   metoprolol succinate  50 mg Oral Daily   sertraline  100 mg Oral Daily   sodium bicarbonate  650 mg Oral TID   sodium chloride flush  3 mL Intravenous Q12H   Continuous Infusions:  sodium chloride 250 mL (01/11/19 1146)   ceFEPime (MAXIPIME) IV 1 g (01/11/19 1148)   ferric gluconate (FERRLECIT/NULECIT) IV 125 mg (01/11/19 1258)   PRN Meds: sodium chloride, albuterol, chlorpheniramine-HYDROcodone, HYDROmorphone (DILAUDID) injection, ipratropium-albuterol, oxyCODONE, sodium chloride flush   Vital Signs    Vitals:   01/12/19 0456 01/12/19 0555 01/12/19 0751 01/12/19 0806  BP: 116/77 118/85  119/80  Pulse: 79 83  91  Resp: 13 15  15   Temp: 98.4 F (36.9 C)     TempSrc: Oral     SpO2:  99% 98% 99%  Weight:      Height:        Intake/Output Summary (Last 24 hours) at 01/12/2019 0823 Last data filed at 01/12/2019 0049 Gross per 24 hour  Intake 100 ml  Output 1856 ml  Net -1756 ml   Filed Weights   01/11/19 0003 01/11/19 0634 01/11/19 1710  Weight: 90.6 kg 91 kg 88.5 kg    Telemetry    Unable to review telemetry down in nuclear medicine. But appears in sinus rhythm during test. - Personally Reviewed  ECG    No new ECG tracing until 01/09/2019. - Personally Reviewed  Physical Exam   GEN: 53 year old female resting comfortably. Very tired but able to be aroused. No acute distress.  Neck: Supple. Cardiac: RRR.  No murmurs, gallops, or rubs.  Respiratory: No increased work of breathing. Mild crackles noted in bases. GI: Abdomen soft, non-distended, and non-tender. Bowel sounds present. Extremities: No pedal edema. SCD in place bilaterally. Skin: Warm and dry. Neuro:  No focal deficits. Psych: Normal affect. Responds appropriately.  Labs    Chemistry Recent Labs  Lab 01/09/19 0729 01/10/19 0234 01/11/19 0258 01/12/19 0418  NA 134*   134* 134*   134* 135   135 138  K 4.8   4.8 4.3   4.4 3.6   3.5 4.0  CL 106   105 106   106 102   101 102  CO2 15*   17* 14*   15* 20*   22 22  GLUCOSE 88   89 89   92 113*   113* 84  BUN 72*   73* 81*   81* 44*   44* 24*  CREATININE 7.50*   7.76* 7.76*   7.86* 5.31*   5.27* 4.04*  CALCIUM 8.0*   8.0* 8.1*   8.1* 8.0*   8.0* 8.0*  PROT 5.1* 5.4* 5.7*  --   ALBUMIN 2.4*   2.4* 2.3*   2.3* 2.4*   2.4* 2.1*  AST 11* 12* 15  --   ALT 7 8 9   --  ALKPHOS 49 45 56  --   BILITOT 0.8 1.1 1.1  --   GFRNONAA 6*   5* 5*   5* 9*   9* 12*  GFRAA 7*   6* 6*   6* 10*   10* 14*  ANIONGAP 13   12 14   13 13   12 14      Hematology Recent Labs  Lab 01/10/19 0234 01/11/19 0258 01/12/19 0418  WBC 10.2 11.5* 7.6  RBC 2.76* 2.96* 2.82*  HGB 7.8* 8.4* 8.0*  HCT 24.6* 25.9* 24.9*  MCV 89.1 87.5 88.3  MCH 28.3 28.4 28.4  MCHC 31.7 32.4 32.1  RDW 15.1 14.8 14.7  PLT 153 155 159    Cardiac Enzymes Recent Labs  Lab 01/07/19 1119 01/07/19 1835 01/07/19 2255 01/10/19 0234  TROPONINI 0.07* 0.07* 0.06* 0.05*   No results for input(s): TROPIPOC in the last 168 hours.   BNPNo results for input(s): BNP, PROBNP in the last 168 hours.   DDimer  Recent Labs  Lab 01/08/19 0631 01/09/19 0729 01/10/19 0234  DDIMER 1.87* 1.83* 2.86*     Radiology    Ir Cyndy Freeze Guide Cv Line Right  Result Date: 01/10/2019 INDICATION: 33 year old with acute kidney injury and needs a dialysis catheter. EXAM: FLUOROSCOPIC AND ULTRASOUND GUIDED PLACEMENT OF A TUNNELED DIALYSIS  CATHETER Physician: Stephan Minister. Anselm Pancoast, MD MEDICATIONS: Vancomycin 1 gm IV; The antibiotic was administered within an appropriate time interval prior to skin puncture. ANESTHESIA/SEDATION: No sedation was given due to respiratory distress. Patient tolerated the procedure well with a non-rebreather. FLUOROSCOPY TIME:  Fluoroscopy Time: 36 seconds, 17 mGy COMPLICATIONS: None immediate. PROCEDURE: Informed consent was obtained for placement of a tunneled dialysis catheter. The patient was placed supine on the interventional table. Ultrasound confirmed a patent right internal jugular vein. Ultrasound images were obtained for documentation. The right side of the neck and chest was prepped and draped in a sterile fashion. The right side of the neck was anesthetized with 1% lidocaine. Maximal barrier sterile technique was utilized including caps, mask, sterile gowns, sterile gloves, sterile drape, hand hygiene and skin antiseptic. A small incision was made with #11 blade scalpel. A 21 gauge needle directed into the right internal jugular vein with ultrasound guidance. A micropuncture dilator set was placed. A 23 cm tip to cuff Palindrome catheter was selected. The skin below the right clavicle was anesthetized and a small incision was made with an #11 blade scalpel. A subcutaneous tunnel was formed to the vein dermatotomy site. The catheter was brought through the tunnel. The vein dermatotomy site was dilated to accommodate a peel-away sheath. The catheter was placed through the peel-away sheath and directed into the central venous structures. The tip of the catheter was placed in the upper right atrium with fluoroscopy. Fluoroscopic images were obtained for documentation. Both lumens were found to aspirate and flush well. The proper amount of heparin was flushed in both lumens. The vein dermatotomy site was closed using a single layer of absorbable suture and Dermabond. Gel-Foam was placed in the subcutaneous tract. The  catheter was secured to the skin using Prolene suture. IMPRESSION: Successful placement of a right jugular tunneled dialysis catheter using ultrasound and fluoroscopic guidance. Electronically Signed   By: Markus Daft M.D.   On: 01/10/2019 15:19   Ir US Guide Vasc Access Right  Result Date: 01/10/2019 INDICATION: 45 year old with acute kidney injury and needs a dialysis catheter. EXAM: FLUOROSCOPIC AND ULTRASOUND GUIDED PLACEMENT OF A TUNNELED DIALYSIS CATHETER Physician: Quita Skye  Acquanetta Belling, MD MEDICATIONS: Vancomycin 1 gm IV; The antibiotic was administered within an appropriate time interval prior to skin puncture. ANESTHESIA/SEDATION: No sedation was given due to respiratory distress. Patient tolerated the procedure well with a non-rebreather. FLUOROSCOPY TIME:  Fluoroscopy Time: 36 seconds, 17 mGy COMPLICATIONS: None immediate. PROCEDURE: Informed consent was obtained for placement of a tunneled dialysis catheter. The patient was placed supine on the interventional table. Ultrasound confirmed a patent right internal jugular vein. Ultrasound images were obtained for documentation. The right side of the neck and chest was prepped and draped in a sterile fashion. The right side of the neck was anesthetized with 1% lidocaine. Maximal barrier sterile technique was utilized including caps, mask, sterile gowns, sterile gloves, sterile drape, hand hygiene and skin antiseptic. A small incision was made with #11 blade scalpel. A 21 gauge needle directed into the right internal jugular vein with ultrasound guidance. A micropuncture dilator set was placed. A 23 cm tip to cuff Palindrome catheter was selected. The skin below the right clavicle was anesthetized and a small incision was made with an #11 blade scalpel. A subcutaneous tunnel was formed to the vein dermatotomy site. The catheter was brought through the tunnel. The vein dermatotomy site was dilated to accommodate a peel-away sheath. The catheter was placed through  the peel-away sheath and directed into the central venous structures. The tip of the catheter was placed in the upper right atrium with fluoroscopy. Fluoroscopic images were obtained for documentation. Both lumens were found to aspirate and flush well. The proper amount of heparin was flushed in both lumens. The vein dermatotomy site was closed using a single layer of absorbable suture and Dermabond. Gel-Foam was placed in the subcutaneous tract. The catheter was secured to the skin using Prolene suture. IMPRESSION: Successful placement of a right jugular tunneled dialysis catheter using ultrasound and fluoroscopic guidance. Electronically Signed   By: Markus Daft M.D.   On: 01/10/2019 15:19    Cardiac Studies   Echocardiogram 01/10/2019: 1. The left ventricle has moderately reduced systolic function, with an ejection fraction of 35-40%. The cavity size was mildly dilated. Left ventricular diastolic Doppler parameters are consistent with pseudonormalization. Left ventricular diffuse hypokinesis.  2. The right ventricle has normal systolic function. The cavity was normal. Right ventricular systolic pressure is mildly elevated with an estimated pressure of 34.4 mmHg.  3. The mitral valve is grossly normal. Mild thickening of the mitral valve leaflet. Mitral valve regurgitation is moderate by color flow Doppler.  4. The tricuspid valve is grossly normal.  5. The aortic valve is tricuspid. Mild thickening of the aortic valve. Aortic valve regurgitation is trivial by color flow Doppler.  6. The inferior vena cava was dilated in size with <50% respiratory variability.  7. Moderate LV dysfunction; mild LVE; moderate diastolic dysfunction; moderate MR; mild TR; mild pulmonary hypertension.  Patient Profile   Ms. Pasley is a 53 y.o. female with a history of CAD s/p NSTEMI with totally occluded of distal PDA in 2018 (medically managed), hypertension, hyperlipidemia, CVA, ITP s/p splenectomy, steroid induced  diabetes mellitus, prior PE, who is being seen today for the evaluation of acute systolic CHF at the request of Dr. Grandville Silos.  Assessment & Plan    Acute Combine CHF - Echo this admission showed newly reduced LVEF of 35-40% (normal on Echo in 02/2017) with diffuse hypokinesis. Likely secondary sepsis and new acute anuric kidney failure. However, ischemia should be ruled out. [facility] -  Continue daily dialysis per Nephrology. - We will focus on BP management.Continue Toprol-XL and Isordil. Unable to use ACEi/ARB/Entresto/Spironolactone given renal function. - Continue to monitor daily weight, strict I/O's, and renal function.  Hypertension - BP has significantly improved from previous admission where she presented with hypertensive urgency. She was discharged on Amlodipine 10mg  daily, Lopressor 25mg  twice daily, and Isordil 10mg  twice daily. BP has been well controlled over the past 24 hours.  - Amlodipine was discontinued yesterday given LV systolic dysfunction - Lopressor was switched to Toprol-XL 50mg  daily starting this morning. - Isordil increased to 20mg  twice daily starting today. - Further titration of medication pending BP response to above.   CAD - Patient has known CAD with history of NSTEMI with totally occluded distal PDA on cardiac catheterization in 04/2017 (managmed medically). - Troponin minimally elevated and flat at 0.09 >> 0.07 >> 0.07 >> 0.06 >> 0.05. Not consistent with ACS. May be secondary to kidney disease. - Patient admits to new worsening chest pain. - Continue aspirin and beta-blocker. - Unclear why patient is not on a statin. Recommend starting Lipitor 40mg  daily. - Lexiscan Myoview today. Results pending.  Acute on Chronic Kidney Disease - With anuria requiring hemodialysis. 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 Patient presented 12/30/2018 with a serum creatinine of 6.88 (baseline ~1.5). Underwent  a renal biopsy which was concerning for TMA 2/2 poorly controlled HTN. Unfortunately creatinine continued to worsen and patient underwent tunneled catheter placement 01/09/2019 and was started on dialysis for management of uremia and volume overload. Creatinine improved to 4.04 today.  - On antibiotics for possible infection. - Continue to avoid nephrotoxic agents. - Continue management per Nephrology/primary team.  Otherwise, per primary team.  For questions or updates, please contact [facility] Please consult [email or web address] for contact info under Cardiology/STEMI.      SignedDarreld Mclean, PA-C  01/12/2019, 8:23 AM   Pager: [phone]

## 2019-01-13 ENCOUNTER — Telehealth: Payer: Self-pay | Admitting: Cardiology

## 2019-01-13 LAB — CBC WITH DIFFERENTIAL/PLATELET
Abs Immature Granulocytes: 0.03 10*3/uL (ref 0.00–0.07)
Basophils Absolute: 0.1 10*3/uL (ref 0.0–0.1)
Basophils Relative: 1 %
Eosinophils Absolute: 0.2 10*3/uL (ref 0.0–0.5)
Eosinophils Relative: 2 %
HCT: 23.1 % — ABNORMAL LOW (ref 36.0–46.0)
Hemoglobin: 7.2 g/dL — ABNORMAL LOW (ref 12.0–15.0)
Immature Granulocytes: 0 %
Lymphocytes Relative: 12 %
Lymphs Abs: 1.1 10*3/uL (ref 0.7–4.0)
MCH: 27.3 pg (ref 26.0–34.0)
MCHC: 31.2 g/dL (ref 30.0–36.0)
MCV: 87.5 fL (ref 80.0–100.0)
Monocytes Absolute: 0.9 10*3/uL (ref 0.1–1.0)
Monocytes Relative: 10 %
Neutro Abs: 6.6 10*3/uL (ref 1.7–7.7)
Neutrophils Relative %: 75 %
Platelets: 158 10*3/uL (ref 150–400)
RBC: 2.64 MIL/uL — ABNORMAL LOW (ref 3.87–5.11)
RDW: 14.5 % (ref 11.5–15.5)
WBC: 8.8 10*3/uL (ref 4.0–10.5)
nRBC: 0 % (ref 0.0–0.2)

## 2019-01-13 LAB — MRSA PCR SCREENING: MRSA by PCR: NEGATIVE

## 2019-01-13 LAB — HEMOGLOBIN AND HEMATOCRIT, BLOOD
HCT: 22.2 % — ABNORMAL LOW (ref 36.0–46.0)
Hemoglobin: 7.1 g/dL — ABNORMAL LOW (ref 12.0–15.0)

## 2019-01-13 LAB — MAGNESIUM: Magnesium: 2.6 mg/dL — ABNORMAL HIGH (ref 1.7–2.4)

## 2019-01-13 MED ORDER — VALACYCLOVIR HCL 500 MG PO TABS
1000.0000 mg | ORAL_TABLET | Freq: Two times a day (BID) | ORAL | Status: AC
  Administered 2019-01-13 – 2019-01-20 (×14): 1000 mg via ORAL
  Filled 2019-01-13 (×15): qty 2

## 2019-01-13 MED ORDER — ACETAMINOPHEN 325 MG PO TABS
650.0000 mg | ORAL_TABLET | Freq: Four times a day (QID) | ORAL | Status: DC | PRN
  Administered 2019-01-13 – 2019-01-19 (×3): 650 mg via ORAL
  Filled 2019-01-13 (×3): qty 2

## 2019-01-13 MED ORDER — LIP MEDEX EX OINT
TOPICAL_OINTMENT | CUTANEOUS | Status: DC | PRN
  Filled 2019-01-13: qty 7

## 2019-01-13 MED ORDER — CALCIUM ACETATE (PHOS BINDER) 667 MG PO CAPS
667.0000 mg | ORAL_CAPSULE | Freq: Three times a day (TID) | ORAL | Status: DC
  Administered 2019-01-13 – 2019-01-17 (×11): 667 mg via ORAL
  Filled 2019-01-13 (×12): qty 1

## 2019-01-13 MED ORDER — ATORVASTATIN CALCIUM 40 MG PO TABS
40.0000 mg | ORAL_TABLET | Freq: Every day | ORAL | Status: DC
  Administered 2019-01-13 – 2019-01-20 (×8): 40 mg via ORAL
  Filled 2019-01-13 (×8): qty 1

## 2019-01-13 NOTE — Discharge Instructions (Signed)
YOUR CARDIOLOGY TEAM HAS ARRANGED FOR AN E-VISIT FOR YOUR APPOINTMENT - PLEASE REVIEW IMPORTANT INFORMATION BELOW SEVERAL DAYS PRIOR TO YOUR APPOINTMENT  Due to the recent COVID-19 pandemic, we are transitioning in-person office visits to tele-medicine visits in an effort to decrease unnecessary exposure to our patients, their families, and staff. These visits are billed to your insurance just like a normal visit is. We also encourage you to sign up for MyChart if you have not already done so. You will need a smartphone if possible. For patients that do not have this, we can still complete the visit using a regular telephone but do prefer a smartphone to enable video when possible. You may have a family member that lives with you that can help. If possible, we also ask that you have a blood pressure cuff and scale at home to measure your blood pressure, heart rate and weight prior to your scheduled appointment. Patients with clinical needs that need an in-person evaluation and testing will still be able to come to the office if absolutely necessary. If you have any questions, feel free to call our office.     YOUR PROVIDER WILL BE USING ONE OF THE FOLLOWING PLATFORMS TO COMPLETE YOUR VISIT:    IF USING WEBEX - How to Download the WebEx App to Your SmartPhone  - If Apple device, go to [employer] and type in WebEx in the search bar. [place] [employer], the blue/green circle. If Android, go to [employer] and type in [employer] in the search bar. The app is free but as with any other app download, your phone may require you to verify saved payment information or Apple/Android password.  - You do NOT have to create an account. - On the day of the visit, our staff will walk you through joining the meeting with the meeting number/password.   IF USING MYCHART - How to Download the MyChart App to Your SmartPhone   - If Apple, go to [employer] and type in MyChart in the search bar and download  the app. If Android, ask patient to go to [employer] and type in [place] in the search bar and download the app. The app is free but as with any other app downloads, your phone may require you to verify saved payment information or Apple/Android password.  - You will need to then log into the app with your MyChart username and password, and select [facility] as your healthcare provider to link the account. When it is time for your visit, go to the MyChart app, find appointments, and click Begin Video Visit. Be sure to Select Allow for your device to access the Microphone and Camera for your visit. You will then be connected, and your provider will be with you shortly.  **If you have any issues connecting or need assistance, please contact MyChart service desk [phone])**  **If using a computer, in order to ensure the best quality for your visit, you will need to use either of the following Internet Browsers: [employer], or Google Chrome**   IF USING DOXIMITY or [email or web address] - The staff will give you instructions on receiving your link to join the meeting the day of your visit.    2-3 DAYS BEFORE YOUR APPOINTMENT  You will receive a telephone call from one of our [place] team members - your caller ID may say "Unknown caller." If this is a video visit, we will walk you through how to set  up your device to be able to complete the visit. We will remind you to check your blood pressure, heart rate and weight prior to your scheduled appointment. If you have an Apple Watch or Kardia, please upload any pertinent ECG strips the day before or morning of your appointment to [facility]. Our staff will also make sure you have reviewed the consent and agree to move forward with your scheduled tele-health visit.     THE DAY OF YOUR APPOINTMENT  Approximately 15 minutes prior to your scheduled appointment, you will receive a telephone call from one of [facility] team - your caller ID may  say "Unknown caller."  Our staff will confirm medications, vital signs for the day and any symptoms you may be experiencing. Please have this information available prior to the time of visit start. It may also be helpful for you to have a pad of paper and pen handy for any instructions given during your visit. They will also walk you through joining the smartphone meeting if this is a video visit.    CONSENT FOR TELE-HEALTH VISIT - PLEASE REVIEW  I hereby voluntarily request, consent and authorize [facility] and its employed or contracted physicians, physician assistants, nurse practitioners or other licensed health care professionals (the Practitioner), to provide me with telemedicine health care services (the Services") as deemed necessary by the treating Practitioner. I acknowledge and consent to receive the Services by the Practitioner via telemedicine. I understand that the telemedicine visit will involve communicating with the Practitioner through live audiovisual communication technology and the disclosure of certain medical information by electronic transmission. I acknowledge that I have been given the opportunity to request an in-person assessment or other available alternative prior to the telemedicine visit and am voluntarily participating in the telemedicine visit.  I understand that I have the right to withhold or withdraw my consent to the use of telemedicine in the course of my care at any time, without affecting my right to future care or treatment, and that the Practitioner or I may terminate the telemedicine visit at any time. I understand that I have the right to inspect all information obtained and/or recorded in the course of the telemedicine visit and may receive copies of available information for a reasonable fee.  I understand that some of the potential risks of receiving the Services via telemedicine include:   Delay or interruption in medical evaluation due to technological  equipment failure or disruption;  Information transmitted may not be sufficient (e.g. poor resolution of images) to allow for appropriate medical decision making by the Practitioner; and/or   In rare instances, security protocols could fail, causing a breach of personal health information.  Furthermore, I acknowledge that it is my responsibility to provide information about my medical history, conditions and care that is complete and accurate to the best of my ability. I acknowledge that Practitioner's advice, recommendations, and/or decision may be based on factors not within their control, such as incomplete or inaccurate data provided by me or distortions of diagnostic images or specimens that may result from electronic transmissions. I understand that the practice of medicine is not an exact science and that Practitioner makes no warranties or guarantees regarding treatment outcomes. I acknowledge that I will receive a copy of this consent concurrently upon execution via email to the email address I last provided but may also request a printed copy by calling the office of [facility].    I understand that my insurance will be billed  for this visit.   I have read or had this consent read to me.  I understand the contents of this consent, which adequately explains the benefits and risks of the Services being provided via telemedicine.   I have been provided ample opportunity to ask questions regarding this consent and the Services and have had my questions answered to my satisfaction.  I give my informed consent for the services to be provided through the use of telemedicine in my medical care  By participating in this telemedicine visit I agree to the above.

## 2019-01-13 NOTE — Telephone Encounter (Signed)
  TOC appt per Sande Rives on 01/26/19 @ 9:00am with Melina Copa

## 2019-01-13 NOTE — Progress Notes (Signed)
Patient ID: Summer Cook, female   DOB: 08-May-1966, 53 y.o.   MRN: [id number] [facility] Progress Note   Assessment/ Plan:   1. Acute kidney Injury versus progressive chronic kidney disease: Nonoliguric and started on hemodialysis for worsening azotemia and impending volume overload.  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  There may be a degree of reversibility with regards to the ATN but at this time, will continue expectant monitoring with anticipation that she may need chronic hemodialysis.   2.  Acute hypoxic respiratory failure: Cleared by pulmonology-low pretest clinical suspicion for COVID-19 with negative testing. 3.  Left extrarenal perinephric hematoma: Status post renal biopsy; hemodynamically stable with downtrending hemoglobin/hematocrit.  Clinically unlikely for this to have transformed to abscess. 4.  Hyperphosphatemia: Secondary to advancing chronic kidney disease and acute kidney injury.  Phosphorus levels low, decrease calcium acetate to 667mg  3 times daily AC. 5.  Gap/non-gap metabolic acidosis: Secondary to acute kidney injury/advancing chronic kidney disease-corrected with dialysis.  Discontinue oral sodium bicarbonate.  Subjective:   Reports to be feeling much better today-significantly better mental status and able to have long/meaningful conversation.   Objective:   BP 119/80   Pulse 89   Temp 98.6 F (37 C) (Oral)   Resp 17   Ht 5\' 5"  (1.651 m)   Wt 87.6 kg   LMP 08/13/2011   SpO2 94%   BMI 32.14 kg/m   Intake/Output Summary (Last 24 hours) at 01/13/2019 1112 Last data filed at 01/13/2019 5681 Gross per 24 hour  Intake 346.68 ml  Output 1700 ml  Net -1353.32 ml   Weight change: 0.6 kg  Physical Exam: Gen: Comfortably resting in bed, mentally lucid CVS: Regular rhythm, normal rate, S1 and S2 normal Resp: Decreased breath sounds of bilateral bases, no  rales/rhonchi Abd: Soft, flat, nontender Ext: Trace lower extremity edema with SCDs  Imaging: Nm Myocar Multi W/spect W/wall Motion / Ef  Result Date: 01/12/2019  Defect 1: There is a small defect present in the apical anterior location.  Findings consistent with prior myocardial infarction.  This is an intermediate risk study.  The left ventricular ejection fraction is moderately decreased (30-44%).  There is a small area of irreversible defect in the apical anterior wall consistent with prior infarct and no ischemia. LVEF 37% with diffuse hypokinesis.    Labs: BMET Recent Labs  Lab 01/07/19 0827 01/08/19 0631 01/09/19 0729 01/10/19 0234 01/11/19 0258 01/12/19 0418 01/13/19 0229  NA 136 135 134*  134* 134*  134* 135  135 138 134*  K 4.1 4.4 4.8  4.8 4.3  4.4 3.6  3.5 4.0 3.6  CL 107 105 106  105 106  106 102  101 102 99  CO2 17* 15* 15*  17* 14*  15* 20*  22 22 25   GLUCOSE 91 95 88  89 89  92 113*  113* 84 84  BUN 71* 70* 72*  73* 81*  81* 44*  44* 24* 12  CREATININE 6.82* 7.26* 7.50*  7.76* 7.76*  7.86* 5.31*  5.27* 4.04* 2.73*  CALCIUM 7.8* 7.7* 8.0*  8.0* 8.1*  8.1* 8.0*  8.0* 8.0* 7.8*  PHOS  --  7.4* 8.2*  8.2* 8.4*  8.6* 5.3* 4.5 2.9   CBC Recent Labs  Lab 01/10/19 0234 01/11/19 0258 01/12/19 0418 01/13/19 0229  WBC 10.2 11.5* 7.6 8.8  NEUTROABS 6.6 9.1* 5.0 6.6  HGB 7.8* 8.4* 8.0* 7.2*  HCT 24.6* 25.9* 24.9* 23.1*  MCV 89.1 87.5 88.3 87.5  PLT 153 155 159 158    Medications:    . ALPRAZolam  0.5 mg Oral TID  . atorvastatin  40 mg Oral q1800  . calcium acetate  1,334 mg Oral TID WC  . Chlorhexidine Gluconate Cloth  6 each Topical Q0600  . guaiFENesin  1,200 mg Oral BID  . hydrALAZINE  25 mg Oral TID  . isosorbide dinitrate  20 mg Oral BID  . mouth rinse  15 mL Mouth Rinse BID  . metoprolol succinate  50 mg Oral Daily  . sertraline  100 mg Oral Daily  . sodium bicarbonate  650 mg Oral TID  . sodium chloride flush  3 mL  Intravenous Q12H   Elmarie Shiley, MD 01/13/2019, 11:12 AM

## 2019-01-13 NOTE — Progress Notes (Signed)
PROGRESS NOTE    Summer Cook  IOX:[id number] DOB: 10-02-1965 DOA: 01/06/2019 PCP: Redmond School, MD    Brief Narrative:  53 year old female with history of hypertension, CAD, GERD, ITP status post splenectomy, PE in 2013, chronic kidney disease stage III and recent AKI on CKD who underwent kidney biopsy on 01/03/2019 with subsequent left flank pain since discharge associated with fever and cough.  In the ED, CT scan of the chest abdomen and pelvis showed cardiomegaly with bilateral pleural effusions and findings consistent with pulmonary edema along with patchy bilateral groundglass opacities in both lungs.  CT scan of the abdomen and pelvis showed a recent left renal biopsy with moderate sized left extrarenal perinephric hematoma with a small foci of air within the hematoma raising question of superimposed infection.  She was admitted for the same.  Nephrology was consulted.  During the hospitalization, her respiratory status worsened.  PCCM was also consulted.  COVID-19 was tested which turned out negative.  Initially she was kept on isolation but after discussion with ID, droplet and contact precautions were discontinued.    Assessment & Plan:   Principal Problem:   Acute renal failure superimposed on stage 3 chronic kidney disease (HCC) Active Problems:   Acute respiratory failure with hypoxia (HCC)   Acute on chronic systolic congestive heart failure (HCC)   Essential hypertension   Stage 3 chronic kidney disease (HCC)   Diabetes mellitus type 2, uncomplicated (HCC)   CAD (coronary artery disease): total distal RCA EF normal (05/20/17)   Fever   CKD (chronic kidney disease), stage V (HCC)   Cough   Renal hematoma, left   Iron deficiency anemia   #1 acute renal failure on chronic kidney disease stage III versus progressive chronic kidney disease Patient noted to go into worsening acute kidney injury and nonoliguric seen by nephrology and subsequently started on hemodialysis due  to worsening renal function and worsening volume overload.  Patient underwent renal biopsy on 01/03/2019 which per nephrology shows TMA from severe hypertension, collapsing FSGS and superimposed ATN with moderate scarring.  Patient receiving hemodialysis due to worsening renal function and volume overload.  Per nephrology there may be a degree of reversibility with regards to ATN and recommending continued expectant monitoring with anticipation that patient may need chronic hemodialysis.  Per nephrology.  2.  Acute hypoxic respiratory failure secondary to acute systolic heart failure likely secondary to acute renal failure Patient noted to have a worsening acute hypoxic respiratory failure felt likely secondary to volume overload secondary to acute renal failure.  Patient was seen by pulmonary and felt to have a low pretest clinical suspicion for COVID-19 with a negative test.  Patient with some clinical improvement after being started on hemodialysis.  2D echo with a EF of 35 to 40% with left ventricular diffuse hypokinesis.  Patient noted to have a normal EF on 05/21/2017.  Patient currently receiving hemodialysis with 1.5 L of fluid removed yesterday.  Due to depressed ejection fraction cardiology has been consulted.  Patient underwent a Lexiscan Myoview study which was negative.  Nephrology following.  3.  Fever /left extrarenal perinephric hematoma Status post recent renal biopsy.  Patient hemodynamically stable.  CT abdomen and pelvis which was done was concerning for perinephric hematoma with question for superimposed infection.  Patient currently afebrile.  Influenza PCR negative.  Respiratory viral panel negative.  COVID-19 testing negative.  Due to worsening respiratory function and due to concern for COVID-19 patient received a dose of hydroxychloroquine which was  subsequently discontinued after tests came back negative.  Case was discussed with ID and by prior hospitalist who recommended  discontinuation of airborne and droplet precautions.  Patient on empiric IV antibiotics of cefepime. White count improved and currently at 8.8.Marland Kitchen  If patient spikes fevers may need to repeat CT abdomen and pelvis to rule out abscess formation. Per nephrology.  Continue IV cefepime to complete a 7-day course of antibiotic treatment.  Discontinue cefepime after today's dose.  Follow.  4.  Iron deficiency anemia Iron level of 12.  TIBC of 249.  Patient receiving IV Nulecit per nephrology.  Status post 1 unit packed red blood cells.  Hemoglobin currently at 7.2 from 8.0 from 8.4 from 6.9.  Repeat H&H this afternoon.  Transfusion threshold hemoglobin less than 7.  Follow H&H.  5.  Metabolic acidosis Secondary to acute kidney injury versus progressive chronic kidney disease.  Patient currently on bicarb tablets which has been discontinued per nephrology.  Per nephrology.  6.  Coronary artery disease status post totally occluded PDA Medically managed prior to admission.  2D echo done with a depressed ejection fraction of 35 to 40% which is worse than prior 2D echo.  Aspirin on hold due to hematoma..  Continue isosorbide.  Patient on Toprol XL and hydralazine added per cardiology.  Patient underwent Lexiscan Myoview study which was negative.  Cardiology recommending patient be started on Lipitor 40 mg daily which has been ordered.  Will need outpatient follow-up with cardiology.  7.  Hypertension Blood pressure medications adjusted per cardiology.  Norvasc has been discontinued.  Patient currently on Toprol-XL, hydralazine, Isordil.   8.  Obesity Outpatient follow-up.  DVT prophylaxis: SCD Code Status: Full Family Communication: Updated patient.  No family at bedside. Disposition Plan: To be determined.  And when stable and okay with nephrology.   Consultants:   Nephrology: Dr. Justin Mend 01/07/2019  PCCM Dr. Lynetta Mare 01/09/2019  Interventional radiology: Dr. Anselm Pancoast 01/10/2019  Cardiology: Dr. Meda Coffee  01/11/2019  Procedures:  CT chest 01/06/2019  CT renal stone protocol for 07/18/2019  Chest x-ray 01/06/2019, 01/08/2019, 01/09/2019, 01/10/2019  Upper scopic and ultrasound-guided placement of tunneled dialysis catheter per Dr. Anselm Pancoast 01/10/2019  Renal ultrasound 01/09/2019  2D echo 01/10/2019  Transfuse 1 unit packed red blood cells 01/09/2019  Lexiscan Myoview pending 01/12/2019  Antimicrobials:   IV cefepime 01/07/2019 >>>> 01/13/2019  IV vancomycin 01/10/2019 x 1 dose    Subjective: Patient sitting up in bed.  More alert and conversant.  Denies any chest pain.  Feels shortness of breath is improved.  Concerned about her renal function.   Objective: Vitals:   01/13/19 0757 01/13/19 0831 01/13/19 0918 01/13/19 1204  BP: 115/73  119/80 108/76  Pulse: 85  89 88  Resp: 13  17 (!) 21  Temp: 98.6 F (37 C)   98.9 F (37.2 C)  TempSrc: Oral   Oral  SpO2: 95% 94% 94% 92%  Weight:      Height:        Intake/Output Summary (Last 24 hours) at 01/13/2019 1246 Last data filed at 01/13/2019 0322 Gross per 24 hour  Intake 346.68 ml  Output 1700 ml  Net -1353.32 ml   Filed Weights   01/11/19 1710 01/12/19 1433 01/12/19 1806  Weight: 88.5 kg 89.1 kg 87.6 kg    Examination:  General exam: NAD Respiratory system: Lungs clear to auscultation bilaterally anterior lung fields.  No wheezing noted.  No crackles.  Normal respiratory effort. Cardiovascular system: RRR no murmurs rubs or  gallops.  No JVD.  No lower extremity edema.  Gastrointestinal system: Abdomen is soft, nontender, nondistended, positive bowel sounds.  No rebound.  No guarding.  Central nervous system: Alert. No focal neurological deficits. Extremities: Symmetric 5 x 5 power. Skin: No rashes, lesions or ulcers Psychiatry: Judgement and insight appear fair. Mood & affect appropriate.     Data Reviewed: I have personally reviewed following labs and imaging studies  CBC: Recent Labs  Lab 01/09/19 0729  01/10/19 0234 01/11/19 0258 01/12/19 0418 01/13/19 0229  WBC 9.6 10.2 11.5* 7.6 8.8  NEUTROABS 5.9 6.6 9.1* 5.0 6.6  HGB 6.9* 7.8* 8.4* 8.0* 7.2*  HCT 22.5* 24.6* 25.9* 24.9* 23.1*  MCV 89.6 89.1 87.5 88.3 87.5  PLT 149* 153 155 159 939   Basic Metabolic Panel: Recent Labs  Lab 01/09/19 0729 01/10/19 0234 01/11/19 0258 01/12/19 0418 01/13/19 0229  NA 134*  134* 134*  134* 135  135 138 134*  K 4.8  4.8 4.3  4.4 3.6  3.5 4.0 3.6  CL 106  105 106  106 102  101 102 99  CO2 15*  17* 14*  15* 20*  22 22 25   GLUCOSE 88  89 89  92 113*  113* 84 84  BUN 72*  73* 81*  81* 44*  44* 24* 12  CREATININE 7.50*  7.76* 7.76*  7.86* 5.31*  5.27* 4.04* 2.73*  CALCIUM 8.0*  8.0* 8.1*  8.1* 8.0*  8.0* 8.0* 7.8*  MG 2.0 2.0 2.0 1.8 2.6*  PHOS 8.2*  8.2* 8.4*  8.6* 5.3* 4.5 2.9   GFR: Estimated Creatinine Clearance: 26 mL/min (A) (by C-G formula based on SCr of 2.73 mg/dL (H)). Liver Function Tests: Recent Labs  Lab 01/07/19 0827 01/08/19 0631 01/09/19 0729 01/10/19 0234 01/11/19 0258 01/12/19 0418 01/13/19 0229  AST 16 12* 11* 12* 15  --   --   ALT 11 10 7 8 9   --   --   ALKPHOS 60 49 49 45 56  --   --   BILITOT 0.7 0.7 0.8 1.1 1.1  --   --   PROT 6.7 5.2* 5.1* 5.4* 5.7*  --   --   ALBUMIN 3.2* 2.4*  2.4* 2.4*  2.4* 2.3*  2.3* 2.4*  2.4* 2.1* 2.1*   No results for input(s): LIPASE, AMYLASE in the last 168 hours. No results for input(s): AMMONIA in the last 168 hours. Coagulation Profile: No results for input(s): INR, PROTIME in the last 168 hours. Cardiac Enzymes: Recent Labs  Lab 01/07/19 0827  01/07/19 1119 01/07/19 1835 01/07/19 2255 01/08/19 0631 01/09/19 0729 01/10/19 0234 01/11/19 0258 01/12/19 0418  CKTOTAL  --    < > 34*  --   --  27* 33* 28* 36* 25*  TROPONINI 0.09*  --  0.07* 0.07* 0.06*  --   --  0.05*  --   --    < > = values in this interval not displayed.   BNP (last 3 results) No results for input(s): PROBNP in the last  8760 hours. HbA1C: No results for input(s): HGBA1C in the last 72 hours. CBG: No results for input(s): GLUCAP in the last 168 hours. Lipid Profile: No results for input(s): CHOL, HDL, LDLCALC, TRIG, CHOLHDL, LDLDIRECT in the last 72 hours. Thyroid Function Tests: No results for input(s): TSH, T4TOTAL, FREET4, T3FREE, THYROIDAB in the last 72 hours. Anemia Panel: No results for input(s): VITAMINB12, FOLATE, FERRITIN, TIBC, IRON, RETICCTPCT in the last 72 hours. Sepsis  Labs: Recent Labs  Lab 01/06/19 2156 01/07/19 0827  PROCALCITON  --  <0.10  LATICACIDVEN 0.8  --     Recent Results (from the past 240 hour(s))  Blood Culture (routine x 2)     Status: None   Collection Time: 01/06/19  9:56 PM  Result Value Ref Range Status   Specimen Description RIGHT ANTECUBITAL  Final   Special Requests   Final    BOTTLES DRAWN AEROBIC AND ANAEROBIC Blood Culture adequate volume   Culture   Final    NO GROWTH 5 DAYS Performed at [facility], [address], [place], [place] [address]    Report Status 01/11/2019 FINAL  Final  Blood Culture (routine x 2)     Status: None   Collection Time: 01/06/19 10:00 PM  Result Value Ref Range Status   Specimen Description LEFT ANTECUBITAL  Final   Special Requests   Final    BOTTLES DRAWN AEROBIC AND ANAEROBIC Blood Culture adequate volume   Culture   Final    NO GROWTH 5 DAYS Performed at [facility], [address], [place], [place] [address]    Report Status 01/11/2019 FINAL  Final  Respiratory Panel by PCR     Status: None   Collection Time: 01/07/19  7:20 AM  Result Value Ref Range Status   Adenovirus NOT DETECTED NOT DETECTED Final   Coronavirus 229E NOT DETECTED NOT DETECTED Final    Comment: (NOTE) The Coronavirus on the Respiratory Panel, DOES NOT test for the novel  Coronavirus (2019 nCoV)    Coronavirus HKU1 NOT DETECTED NOT DETECTED Final   Coronavirus NL63 NOT DETECTED NOT DETECTED Final   Coronavirus OC43 NOT DETECTED NOT DETECTED  Final   Metapneumovirus NOT DETECTED NOT DETECTED Final   Rhinovirus / Enterovirus NOT DETECTED NOT DETECTED Final   Influenza A NOT DETECTED NOT DETECTED Final   Influenza B NOT DETECTED NOT DETECTED Final   Parainfluenza Virus 1 NOT DETECTED NOT DETECTED Final   Parainfluenza Virus 2 NOT DETECTED NOT DETECTED Final   Parainfluenza Virus 3 NOT DETECTED NOT DETECTED Final   Parainfluenza Virus 4 NOT DETECTED NOT DETECTED Final   Respiratory Syncytial Virus NOT DETECTED NOT DETECTED Final   Bordetella pertussis NOT DETECTED NOT DETECTED Final   Chlamydophila pneumoniae NOT DETECTED NOT DETECTED Final   Mycoplasma pneumoniae NOT DETECTED NOT DETECTED Final    Comment: Performed at [facility], [place] [address], [place], [place] [address]  Novel Coronavirus, NAA (hospital order; send-out to ref lab)     Status: None   Collection Time: 01/07/19  7:30 AM  Result Value Ref Range Status   SARS-CoV-2, NAA NOT DETECTED NOT DETECTED Final    Comment: Performed at [facility] Performed at [facility], [place] [address] [place], [place] [address]    Coronavirus Source NASOPHARYNGEAL  Final    Comment: Performed at [facility], [address] [place], [place] [address]  MRSA PCR Screening     Status: None   Collection Time: 01/13/19  1:52 AM  Result Value Ref Range Status   MRSA by PCR NEGATIVE NEGATIVE Final    Comment:        The GeneXpert MRSA Assay (FDA approved for NASAL specimens only), is one component of a comprehensive MRSA colonization surveillance program. It is not intended to diagnose MRSA infection nor to guide or monitor treatment for MRSA infections. Performed at [facility], [place] [address], [place], [place]  [address]          Radiology Studies: Nm Myocar Multi W/spect W/wall Motion / Ef  Result Date: 01/12/2019  Defect 1: There is a small defect present in the apical anterior location.  Findings consistent with prior myocardial infarction.   This is an intermediate risk study.  The left ventricular ejection fraction is moderately decreased (30-44%).  There is a small area of irreversible defect in the apical anterior wall consistent with prior infarct and no ischemia. LVEF 37% with diffuse hypokinesis.        Scheduled Meds: . ALPRAZolam  0.5 mg Oral TID  . atorvastatin  40 mg Oral q1800  . calcium acetate  667 mg Oral TID WC  . Chlorhexidine Gluconate Cloth  6 each Topical Q0600  . guaiFENesin  1,200 mg Oral BID  . hydrALAZINE  25 mg Oral TID  . isosorbide dinitrate  20 mg Oral BID  . mouth rinse  15 mL Mouth Rinse BID  . metoprolol succinate  50 mg Oral Daily  . sertraline  100 mg Oral Daily  . sodium chloride flush  3 mL Intravenous Q12H   Continuous Infusions: . sodium chloride 250 mL (01/11/19 1146)  . ceFEPime (MAXIPIME) IV 1 g (01/12/19 2049)  . ferric gluconate (FERRLECIT/NULECIT) IV 125 mg (01/12/19 2132)     LOS: 6 days    Time spent: 40 mins    Irine Seal, MD [facility]  If 7PM-7AM, please contact night-coverage [email or web address] 01/13/2019, 12:46 PM

## 2019-01-13 NOTE — Progress Notes (Addendum)
Patient temp increasing.  Temp currently 100.9 orally.  Per MD note, patient might need repeat CT of abdomen and pelvis.   Notified Jeannette Corpus, NP.  Will continue to monitor the patient and notify a needed.

## 2019-01-13 NOTE — Progress Notes (Signed)
Pt c/o right upper arm pain 9/10, worsening with movements. On assessment RN noted redness, some swelling and skin warm to touch. Pt denied numbness and tingling, sensation intact, distal pulses strong and capillary refill less than 3 sec.  Schorr, NP notified and pain medicine administered.

## 2019-01-14 ENCOUNTER — Inpatient Hospital Stay (HOSPITAL_COMMUNITY): Payer: Medicare Other

## 2019-01-14 LAB — RENAL FUNCTION PANEL
Albumin: 2.1 g/dL — ABNORMAL LOW (ref 3.5–5.0)
Albumin: 2 g/dL — ABNORMAL LOW (ref 3.5–5.0)
Anion gap: 10 (ref 5–15)
Anion gap: 10 (ref 5–15)
BUN: 12 mg/dL (ref 6–20)
BUN: 29 mg/dL — ABNORMAL HIGH (ref 6–20)
CO2: 25 mmol/L (ref 22–32)
CO2: 27 mmol/L (ref 22–32)
Calcium: 7.8 mg/dL — ABNORMAL LOW (ref 8.9–10.3)
Calcium: 8 mg/dL — ABNORMAL LOW (ref 8.9–10.3)
Chloride: 99 mmol/L (ref 98–111)
Chloride: 95 mmol/L — ABNORMAL LOW (ref 98–111)
Creatinine, Ser: 2.73 mg/dL — ABNORMAL HIGH (ref 0.44–1.00)
Creatinine, Ser: 4.27 mg/dL — ABNORMAL HIGH (ref 0.44–1.00)
GFR calc Af Amer: 22 mL/min — ABNORMAL LOW (ref >60)
GFR calc Af Amer: 13 mL/min — ABNORMAL LOW (ref >60)
GFR calc non Af Amer: 19 mL/min — ABNORMAL LOW (ref >60)
GFR calc non Af Amer: 11 mL/min — ABNORMAL LOW (ref >60)
Glucose, Bld: 84 mg/dL (ref 70–99)
Glucose, Bld: 101 mg/dL — ABNORMAL HIGH (ref 70–99)
Phosphorus: 2.9 mg/dL (ref 2.5–4.6)
Phosphorus: 3.1 mg/dL (ref 2.5–4.6)
Potassium: 3.6 mmol/L (ref 3.5–5.1)
Potassium: 3.9 mmol/L (ref 3.5–5.1)
Sodium: 134 mmol/L — ABNORMAL LOW (ref 135–145)
Sodium: 132 mmol/L — ABNORMAL LOW (ref 135–145)

## 2019-01-14 LAB — CBC
HCT: 22.7 % — ABNORMAL LOW (ref 36.0–46.0)
Hemoglobin: 7 g/dL — ABNORMAL LOW (ref 12.0–15.0)
MCH: 27.1 pg (ref 26.0–34.0)
MCHC: 30.8 g/dL (ref 30.0–36.0)
MCV: 88 fL (ref 80.0–100.0)
Platelets: 164 10*3/uL (ref 150–400)
RBC: 2.58 MIL/uL — ABNORMAL LOW (ref 3.87–5.11)
RDW: 14.5 % (ref 11.5–15.5)
WBC: 9.8 10*3/uL (ref 4.0–10.5)
nRBC: 0 % (ref 0.0–0.2)

## 2019-01-14 LAB — HEMOGLOBIN AND HEMATOCRIT, BLOOD
HCT: 23.7 % — ABNORMAL LOW (ref 36.0–46.0)
Hemoglobin: 7.7 g/dL — ABNORMAL LOW (ref 12.0–15.0)

## 2019-01-14 MED ORDER — ONDANSETRON HCL 4 MG/2ML IJ SOLN
4.0000 mg | Freq: Four times a day (QID) | INTRAMUSCULAR | Status: DC | PRN
  Administered 2019-01-15 – 2019-01-20 (×10): 4 mg via INTRAVENOUS
  Filled 2019-01-14 (×10): qty 2

## 2019-01-14 MED ORDER — PROCHLORPERAZINE EDISYLATE 10 MG/2ML IJ SOLN
10.0000 mg | Freq: Four times a day (QID) | INTRAMUSCULAR | Status: DC | PRN
  Administered 2019-01-14: 10 mg via INTRAVENOUS
  Filled 2019-01-14: qty 2

## 2019-01-14 NOTE — Progress Notes (Signed)
Patient ID: Summer Cook, female   DOB: [birth year], 53 y.o.   MRN: [id number] [facility] Progress Note   Assessment/ Plan:   1. Acute kidney Injury versus progressive chronic kidney disease: Anuric overnight and started on hemodialysis for worsening azotemia and impending volume overload.  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  There may be a degree of reversibility with regards to the ATN but at this time, will continue expectant monitoring with anticipation that she may need chronic hemodialysis.  Trajectory of renal function over the weekend will be useful in making the decision regarding pursue of permanent access. 2.  Acute hypoxic respiratory failure: Cleared by pulmonology-low pretest clinical suspicion for COVID-19 with negative testing. 3.  Left extrarenal perinephric hematoma: Status post renal biopsy; hemodynamically stable with downtrending hemoglobin/hematocrit.  Now that she has recurrent fever, agree with plans to reimage abdomen evaluating for possible abscess. 4.  Hyperphosphatemia: Secondary to advancing chronic kidney disease and acute kidney injury.  Phosphorus levels low, decrease calcium acetate to 667mg  3 times daily AC. 5.  Gap/non-gap metabolic acidosis: Secondary to acute kidney injury/advancing chronic kidney disease-corrected with dialysis.    Subjective:   Febrile overnight with some nausea.  Feels better this morning.   Objective:   BP 122/87 (BP Location: Left Arm)   Pulse 80   Temp 97.9 F (36.6 C) (Oral)   Resp (!) 23   Ht 5\' 5"  (1.651 m)   Wt 87.6 kg   LMP 08/13/2011   SpO2 93%   BMI 32.14 kg/m   Intake/Output Summary (Last 24 hours) at 01/14/2019 1017 Last data filed at 01/14/2019 0851 Gross per 24 hour  Intake 427 ml  Output -  Net 427 ml   Weight change:   Physical Exam: Gen: Comfortably resting in bed,  CVS: Regular rhythm, normal rate, S1 and  S2 normal Resp: Decreased breath sounds of bilateral bases, no rales/rhonchi Abd: Soft, flat, nontender Ext: Trace lower extremity edema with SCDs  Imaging: Ct Abdomen Pelvis Wo Contrast  Result Date: 01/14/2019 CLINICAL DATA:  Lymphadenopathy. EXAM: CT ABDOMEN AND PELVIS WITHOUT CONTRAST TECHNIQUE: Multidetector CT imaging of the abdomen and pelvis was performed following the standard protocol without IV contrast. COMPARISON:  CT 01/06/2019 FINDINGS: Lower chest: Bilateral pleural effusions and basilar passive atelectasis similar to slightly increased from comparison exam. Hepatobiliary: Non IV contrast images demonstrate no focal hepatic lesion. Postcholecystectomy. Pancreas: Pancreas is normal. No ductal dilatation. No pancreatic inflammation. Spleen: Post splenectomy Adrenals/urinary tract: A fluid collection in the LEFT perinephric space deep to the LEFT kidney similar to comparison exam and consistent with post a biopsy hematoma. No increased volume of the hematoma. No Organization to suggest abscess. Non IV contrast study noted. The kidneys otherwise are unremarkable on this noncontrast exam. No hydronephrosis. Bladder normal. Stomach/Bowel: Stomach, small-bowel cecum normal. The colon and rectosigmoid colon are normal. The descending colon does exit a LEFT lateral abdominal wall hernia for approximately 3 cm (image 74/6). No evidence obstruction. The hernia is evident on comparison exams but no bowel enters the hernia sac on comparison exams. Vascular/Lymphatic: Abdominal aorta is normal caliber. No periportal or retroperitoneal adenopathy. No pelvic adenopathy. Reproductive: Uterus and ovaries normal. Other: No free fluid. Musculoskeletal: No aggressive osseous lesion. IMPRESSION: 1. Stable perinephric hematoma deep to the LEFT kidney. No evidence of renal infection on noncontrast exam. 2. Descending colon has a short segment entering a LEFT lateral wall hernia. No evidence  obstruction. 3. Bilateral  moderate size pleural effusions with basilar passive atelectasis. Mild increase in effusion volume. Electronically Signed   By: Suzy Bouchard M.D.   On: 01/14/2019 07:00   Nm Myocar Multi W/spect W/wall Motion / Ef  Result Date: 01/12/2019  Defect 1: There is a small defect present in the apical anterior location.  Findings consistent with prior myocardial infarction.  This is an intermediate risk study.  The left ventricular ejection fraction is moderately decreased (30-44%).  There is a small area of irreversible defect in the apical anterior wall consistent with prior infarct and no ischemia. LVEF 37% with diffuse hypokinesis.    Labs: BMET Recent Labs  Lab 01/08/19 0631 01/09/19 0729 01/10/19 0234 01/11/19 0258 01/12/19 0418 01/13/19 0229 01/14/19 0309  NA 135 134*  134* 134*  134* 135  135 138 134* 132*  K 4.4 4.8  4.8 4.3  4.4 3.6  3.5 4.0 3.6 3.9  CL 105 106  105 106  106 102  101 102 99 95*  CO2 15* 15*  17* 14*  15* 20*  22 22 25 27   GLUCOSE 95 88  89 89  92 113*  113* 84 84 101*  BUN 70* 72*  73* 81*  81* 44*  44* 24* 12 29*  CREATININE 7.26* 7.50*  7.76* 7.76*  7.86* 5.31*  5.27* 4.04* 2.73* 4.27*  CALCIUM 7.7* 8.0*  8.0* 8.1*  8.1* 8.0*  8.0* 8.0* 7.8* 8.0*  PHOS 7.4* 8.2*  8.2* 8.4*  8.6* 5.3* 4.5 2.9 3.1   CBC Recent Labs  Lab 01/10/19 0234 01/11/19 0258 01/12/19 0418 01/13/19 0229 01/13/19 1358 01/14/19 0309  WBC 10.2 11.5* 7.6 8.8  --  9.8  NEUTROABS 6.6 9.1* 5.0 6.6  --   --   HGB 7.8* 8.4* 8.0* 7.2* 7.1* 7.0*  HCT 24.6* 25.9* 24.9* 23.1* 22.2* 22.7*  MCV 89.1 87.5 88.3 87.5  --  88.0  PLT 153 155 159 158  --  164    Medications:    . ALPRAZolam  0.5 mg Oral TID  . atorvastatin  40 mg Oral q1800  . calcium acetate  667 mg Oral TID WC  . Chlorhexidine Gluconate Cloth  6 each Topical Q0600  . guaiFENesin  1,200 mg Oral BID  . hydrALAZINE  25 mg Oral TID  . isosorbide dinitrate  20 mg Oral BID  . mouth rinse  15 mL  Mouth Rinse BID  . metoprolol succinate  50 mg Oral Daily  . sertraline  100 mg Oral Daily  . sodium chloride flush  3 mL Intravenous Q12H  . valACYclovir  1,000 mg Oral BID   Elmarie Shiley, MD 01/14/2019, 10:17 AM

## 2019-01-14 NOTE — Progress Notes (Signed)
PROGRESS NOTE    Summer Cook  RCB:[id number] DOB: 06-Dec-1965 DOA: 01/06/2019 PCP: Redmond School, MD    Brief Narrative:  53 year old female with history of hypertension, CAD, GERD, ITP status post splenectomy, PE in 2013, chronic kidney disease stage III and recent AKI on CKD who underwent kidney biopsy on 01/03/2019 with subsequent left flank pain since discharge associated with fever and cough.  In the ED, CT scan of the chest abdomen and pelvis showed cardiomegaly with bilateral pleural effusions and findings consistent with pulmonary edema along with patchy bilateral groundglass opacities in both lungs.  CT scan of the abdomen and pelvis showed a recent left renal biopsy with moderate sized left extrarenal perinephric hematoma with a small foci of air within the hematoma raising question of superimposed infection.  She was admitted for the same.  Nephrology was consulted.  During the hospitalization, her respiratory status worsened.  PCCM was also consulted.  COVID-19 was tested which turned out negative.  Initially she was kept on isolation but after discussion with ID, droplet and contact precautions were discontinued.    Assessment & Plan:   Principal Problem:   Acute renal failure superimposed on stage 3 chronic kidney disease (HCC) Active Problems:   Acute respiratory failure with hypoxia (HCC)   Acute on chronic systolic congestive heart failure (HCC)   Essential hypertension   Stage 3 chronic kidney disease (HCC)   Diabetes mellitus type 2, uncomplicated (HCC)   CAD (coronary artery disease): total distal RCA EF normal (05/20/17)   Fever   CKD (chronic kidney disease), stage V (HCC)   Cough   Renal hematoma, left   Iron deficiency anemia   #1 acute renal failure on chronic kidney disease stage III versus progressive chronic kidney disease Patient noted to go into worsening acute kidney injury and nonoliguric seen by nephrology and subsequently started on hemodialysis due  to worsening renal function and worsening volume overload.  Patient underwent renal biopsy on 01/03/2019 which per nephrology shows TMA from severe hypertension, collapsing FSGS and superimposed ATN with moderate scarring.  Patient receiving hemodialysis due to worsening renal function and volume overload.  Per nephrology there may be a degree of reversibility with regards to ATN and recommending continued expectant monitoring with anticipation that patient may need chronic hemodialysis.  Patient currently off hemodialysis through the weekend.  Per nephrology.  2.  Acute hypoxic respiratory failure secondary to acute systolic heart failure likely secondary to acute renal failure Patient noted to have a worsening acute hypoxic respiratory failure felt likely secondary to volume overload secondary to acute renal failure.  Patient was seen by pulmonary and felt to have a low pretest clinical suspicion for COVID-19 with a negative test.  Patient with some clinical improvement after being started on hemodialysis.  2D echo with a EF of 35 to 40% with left ventricular diffuse hypokinesis.  Patient noted to have a normal EF on 05/21/2017.  Patient currently receiving hemodialysis. Due to depressed ejection fraction cardiology has been consulted.  Patient underwent a Lexiscan Myoview study which was negative.  Nephrology following.  3.  Fever /left extrarenal perinephric hematoma Status post recent renal biopsy.  Patient hemodynamically stable.  CT abdomen and pelvis which was done was concerning for perinephric hematoma with question for superimposed infection.  Patient currently afebrile.  Influenza PCR negative.  Respiratory viral panel negative.  COVID-19 testing negative.  Due to worsening respiratory function and due to concern for COVID-19 patient received a dose of hydroxychloroquine which was  subsequently discontinued after tests came back negative.  Case was discussed with ID and by prior hospitalist who  recommended discontinuation of airborne and droplet precautions.  Patient on empiric IV antibiotics of cefepime. White count improved and currently at 9.8.  Patient noted to have low-grade temp overnight and CT abdomen and pelvis repeated and negative for abscess formation.  CT abdomen and pelvis shows a stable perinephric hematoma deep to the left kidney.  Bilateral moderate sized pleural effusions with basilar passive atelectasis.  Mild increase in effusion volume.  Patient status post 7-day course of IV cefepime.  Nephrology following.  4.  Iron deficiency anemia Iron level of 12.  TIBC of 249.  Patient receiving IV Nulecit per nephrology.  Status post 1 unit packed red blood cells.  Hemoglobin currently at 7.0 from 7.2 from 8.0 from 8.4 from 6.9.  Repeat H&H this afternoon.  Transfusion threshold hemoglobin less than 7.  Follow H&H.  5.  Metabolic acidosis Secondary to acute kidney injury versus progressive chronic kidney disease.  Patient currently on bicarb tablets which has been discontinued per nephrology.  Per nephrology.  6.  Coronary artery disease status post totally occluded PDA Medically managed prior to admission.  2D echo done with a depressed ejection fraction of 35 to 40% which is worse than prior 2D echo.  Aspirin on hold due to hematoma..  Continue isosorbide.  Patient on Toprol XL and hydralazine added per cardiology.  Patient underwent Lexiscan Myoview study which was negative.  Patient started on Lipitor 40 mg daily.  Outpatient follow-up with cardiology.   7.  Hypertension Blood pressure medications adjusted per cardiology.  Norvasc has been discontinued.  Patient currently on Toprol-XL, hydralazine, Isordil.   8.  Obesity Outpatient follow-up.  DVT prophylaxis: SCD Code Status: Full Family Communication: Updated patient.  No family at bedside. Disposition Plan: To be determined.  And when stable and okay with nephrology.   Consultants:   Nephrology: Dr. Justin Mend  01/07/2019  PCCM Dr. Lynetta Mare 01/09/2019  Interventional radiology: Dr. Anselm Pancoast 01/10/2019  Cardiology: Dr. Meda Coffee 01/11/2019  Procedures:  CT chest 01/06/2019  CT renal stone protocol for 07/18/2019  Chest x-ray 01/06/2019, 01/08/2019, 01/09/2019, 01/10/2019  Upper scopic and ultrasound-guided placement of tunneled dialysis catheter per Dr. Anselm Pancoast 01/10/2019  Renal ultrasound 01/09/2019  2D echo 01/10/2019  Transfuse 1 unit packed red blood cells 01/09/2019  Lexiscan Myoview pending 01/12/2019  CT abdomen and pelvis 01/14/2019  Antimicrobials:   IV cefepime 01/07/2019 >>>> 01/13/2019  IV vancomycin 01/10/2019 x 1 dose    Subjective: Patient asleep.  Patient noted to have some nausea and emesis early on this morning and patient given dose of IV Compazine.  Patient noted to have low-grade temp overnight.    Objective: Vitals:   01/13/19 2332 01/14/19 0103 01/14/19 0356 01/14/19 0503  BP:  102/74  122/87  Pulse:  80  80  Resp:    (!) 23  Temp: 98.6 F (37 C)  97.9 F (36.6 C)   TempSrc: Oral  Oral   SpO2:  97%  93%  Weight:      Height:        Intake/Output Summary (Last 24 hours) at 01/14/2019 1044 Last data filed at 01/14/2019 0851 Gross per 24 hour  Intake 427 ml  Output -  Net 427 ml   Filed Weights   01/11/19 1710 01/12/19 1433 01/12/19 1806  Weight: 88.5 kg 89.1 kg 87.6 kg    Examination:  General exam: NAD Respiratory system: CTAB.  No wheezes, no crackles, no rhonchi.  Normal respiratory effort.  Cardiovascular system: Regular rate rhythm no murmurs rubs or gallops.  No JVD.  No lower extremity edema.  Gastrointestinal system: Abdomen is soft, nontender, nondistended, positive bowel sounds.  No rebound.  No guarding.  Central nervous system: Alert. No focal neurological deficits. Extremities: Symmetric 5 x 5 power. Skin: No rashes, lesions or ulcers Psychiatry: Judgement and insight appear fair. Mood & affect appropriate.     Data Reviewed: I have  personally reviewed following labs and imaging studies  CBC: Recent Labs  Lab 01/09/19 0729 01/10/19 0234 01/11/19 0258 01/12/19 0418 01/13/19 0229 01/13/19 1358 01/14/19 0309  WBC 9.6 10.2 11.5* 7.6 8.8  --  9.8  NEUTROABS 5.9 6.6 9.1* 5.0 6.6  --   --   HGB 6.9* 7.8* 8.4* 8.0* 7.2* 7.1* 7.0*  HCT 22.5* 24.6* 25.9* 24.9* 23.1* 22.2* 22.7*  MCV 89.6 89.1 87.5 88.3 87.5  --  88.0  PLT 149* 153 155 159 158  --  517   Basic Metabolic Panel: Recent Labs  Lab 01/09/19 0729 01/10/19 0234 01/11/19 0258 01/12/19 0418 01/13/19 0229 01/14/19 0309  NA 134*  134* 134*  134* 135  135 138 134* 132*  K 4.8  4.8 4.3  4.4 3.6  3.5 4.0 3.6 3.9  CL 106  105 106  106 102  101 102 99 95*  CO2 15*  17* 14*  15* 20*  22 22 25 27   GLUCOSE 88  89 89  92 113*  113* 84 84 101*  BUN 72*  73* 81*  81* 44*  44* 24* 12 29*  CREATININE 7.50*  7.76* 7.76*  7.86* 5.31*  5.27* 4.04* 2.73* 4.27*  CALCIUM 8.0*  8.0* 8.1*  8.1* 8.0*  8.0* 8.0* 7.8* 8.0*  MG 2.0 2.0 2.0 1.8 2.6*  --   PHOS 8.2*  8.2* 8.4*  8.6* 5.3* 4.5 2.9 3.1   GFR: Estimated Creatinine Clearance: 16.6 mL/min (A) (by C-G formula based on SCr of 4.27 mg/dL (H)). Liver Function Tests: Recent Labs  Lab 01/08/19 0631 01/09/19 0729 01/10/19 0234 01/11/19 0258 01/12/19 0418 01/13/19 0229 01/14/19 0309  AST 12* 11* 12* 15  --   --   --   ALT 10 7 8 9   --   --   --   ALKPHOS 49 49 45 56  --   --   --   BILITOT 0.7 0.8 1.1 1.1  --   --   --   PROT 5.2* 5.1* 5.4* 5.7*  --   --   --   ALBUMIN 2.4*  2.4* 2.4*  2.4* 2.3*  2.3* 2.4*  2.4* 2.1* 2.1* 2.0*   No results for input(s): LIPASE, AMYLASE in the last 168 hours. No results for input(s): AMMONIA in the last 168 hours. Coagulation Profile: No results for input(s): INR, PROTIME in the last 168 hours. Cardiac Enzymes: Recent Labs  Lab 01/07/19 1119 01/07/19 1835 01/07/19 2255 01/08/19 0631 01/09/19 0729 01/10/19 0234 01/11/19 0258 01/12/19 0418   CKTOTAL 34*  --   --  27* 33* 28* 36* 25*  TROPONINI 0.07* 0.07* 0.06*  --   --  0.05*  --   --    BNP (last 3 results) No results for input(s): PROBNP in the last 8760 hours. HbA1C: No results for input(s): HGBA1C in the last 72 hours. CBG: No results for input(s): GLUCAP in the last 168 hours. Lipid Profile: No results for input(s): CHOL,  HDL, LDLCALC, TRIG, CHOLHDL, LDLDIRECT in the last 72 hours. Thyroid Function Tests: No results for input(s): TSH, T4TOTAL, FREET4, T3FREE, THYROIDAB in the last 72 hours. Anemia Panel: No results for input(s): VITAMINB12, FOLATE, FERRITIN, TIBC, IRON, RETICCTPCT in the last 72 hours. Sepsis Labs: No results for input(s): PROCALCITON, LATICACIDVEN in the last 168 hours.  Recent Results (from the past 240 hour(s))  Blood Culture (routine x 2)     Status: None   Collection Time: 01/06/19  9:56 PM  Result Value Ref Range Status   Specimen Description RIGHT ANTECUBITAL  Final   Special Requests   Final    BOTTLES DRAWN AEROBIC AND ANAEROBIC Blood Culture adequate volume   Culture   Final    NO GROWTH 5 DAYS Performed at [facility], [address], [place], [place] [address]    Report Status 01/11/2019 FINAL  Final  Blood Culture (routine x 2)     Status: None   Collection Time: 01/06/19 10:00 PM  Result Value Ref Range Status   Specimen Description LEFT ANTECUBITAL  Final   Special Requests   Final    BOTTLES DRAWN AEROBIC AND ANAEROBIC Blood Culture adequate volume   Culture   Final    NO GROWTH 5 DAYS Performed at [facility], [address], [place], [place] [address]    Report Status 01/11/2019 FINAL  Final  Respiratory Panel by PCR     Status: None   Collection Time: 01/07/19  7:20 AM  Result Value Ref Range Status   Adenovirus NOT DETECTED NOT DETECTED Final   Coronavirus 229E NOT DETECTED NOT DETECTED Final    Comment: (NOTE) The Coronavirus on the Respiratory Panel, DOES NOT test for the novel  Coronavirus (2019 nCoV)     Coronavirus HKU1 NOT DETECTED NOT DETECTED Final   Coronavirus NL63 NOT DETECTED NOT DETECTED Final   Coronavirus OC43 NOT DETECTED NOT DETECTED Final   Metapneumovirus NOT DETECTED NOT DETECTED Final   Rhinovirus / Enterovirus NOT DETECTED NOT DETECTED Final   Influenza A NOT DETECTED NOT DETECTED Final   Influenza B NOT DETECTED NOT DETECTED Final   Parainfluenza Virus 1 NOT DETECTED NOT DETECTED Final   Parainfluenza Virus 2 NOT DETECTED NOT DETECTED Final   Parainfluenza Virus 3 NOT DETECTED NOT DETECTED Final   Parainfluenza Virus 4 NOT DETECTED NOT DETECTED Final   Respiratory Syncytial Virus NOT DETECTED NOT DETECTED Final   Bordetella pertussis NOT DETECTED NOT DETECTED Final   Chlamydophila pneumoniae NOT DETECTED NOT DETECTED Final   Mycoplasma pneumoniae NOT DETECTED NOT DETECTED Final    Comment: Performed at [facility], [place] [address], [place], [place] [address]  Novel Coronavirus, NAA (hospital order; send-out to ref lab)     Status: None   Collection Time: 01/07/19  7:30 AM  Result Value Ref Range Status   SARS-CoV-2, NAA NOT DETECTED NOT DETECTED Final    Comment: Performed at [facility] Performed at [facility], [place] [address], [place], [place] [address]    Coronavirus Source NASOPHARYNGEAL  Final    Comment: Performed at [facility], [address], [place], [place] [address]  MRSA PCR Screening     Status: None   Collection Time: 01/13/19  1:52 AM  Result Value Ref Range Status   MRSA by PCR NEGATIVE NEGATIVE Final    Comment:        The GeneXpert MRSA Assay (FDA approved for NASAL specimens only), is one component of a  comprehensive MRSA colonization surveillance program. It is not intended to diagnose MRSA infection nor to guide or monitor treatment for MRSA infections. Performed at [facility], [place] [address], [place], [place] [address]          Radiology Studies: Ct Abdomen Pelvis Wo Contrast  Result Date:  01/14/2019 CLINICAL DATA:  Lymphadenopathy. EXAM: CT ABDOMEN AND PELVIS WITHOUT CONTRAST TECHNIQUE: Multidetector CT imaging of the abdomen and pelvis was performed following the standard protocol without IV contrast. COMPARISON:  CT 01/06/2019 FINDINGS: Lower chest: Bilateral pleural effusions and basilar passive atelectasis similar to slightly increased from comparison exam. Hepatobiliary: Non IV contrast images demonstrate no focal hepatic lesion. Postcholecystectomy. Pancreas: Pancreas is normal. No ductal dilatation. No pancreatic inflammation. Spleen: Post splenectomy Adrenals/urinary tract: A fluid collection in the LEFT perinephric space deep to the LEFT kidney similar to comparison exam and consistent with post a biopsy hematoma. No increased volume of the hematoma. No Organization to suggest abscess. Non IV contrast study noted. The kidneys otherwise are unremarkable on this noncontrast exam. No hydronephrosis. Bladder normal. Stomach/Bowel: Stomach, small-bowel cecum normal. The colon and rectosigmoid colon are normal. The descending colon does exit a LEFT lateral abdominal wall hernia for approximately 3 cm (image 74/6). No evidence obstruction. The hernia is evident on comparison exams but no bowel enters the hernia sac on comparison exams. Vascular/Lymphatic: Abdominal aorta is normal caliber. No periportal or retroperitoneal adenopathy. No pelvic adenopathy. Reproductive: Uterus and ovaries normal. Other: No free fluid. Musculoskeletal: No aggressive osseous lesion. IMPRESSION: 1. Stable perinephric hematoma deep to the LEFT kidney. No evidence of renal infection on noncontrast exam. 2. Descending colon has a short segment entering a LEFT lateral wall hernia. No evidence obstruction. 3. Bilateral moderate size pleural effusions with basilar passive atelectasis. Mild increase in effusion volume. Electronically Signed   By: Suzy Bouchard M.D.   On: 01/14/2019 07:00   Nm Myocar Multi W/spect W/wall  Motion / Ef  Result Date: 01/12/2019  Defect 1: There is a small defect present in the apical anterior location.  Findings consistent with prior myocardial infarction.  This is an intermediate risk study.  The left ventricular ejection fraction is moderately decreased (30-44%).  There is a small area of irreversible defect in the apical anterior wall consistent with prior infarct and no ischemia. LVEF 37% with diffuse hypokinesis.        Scheduled Meds: . ALPRAZolam  0.5 mg Oral TID  . atorvastatin  40 mg Oral q1800  . calcium acetate  667 mg Oral TID WC  . Chlorhexidine Gluconate Cloth  6 each Topical Q0600  . guaiFENesin  1,200 mg Oral BID  . hydrALAZINE  25 mg Oral TID  . isosorbide dinitrate  20 mg Oral BID  . mouth rinse  15 mL Mouth Rinse BID  . metoprolol succinate  50 mg Oral Daily  . sertraline  100 mg Oral Daily  . sodium chloride flush  3 mL Intravenous Q12H  . valACYclovir  1,000 mg Oral BID   Continuous Infusions: . sodium chloride 250 mL (01/11/19 1146)  . ferric gluconate (FERRLECIT/NULECIT) IV 125 mg (01/13/19 2204)     LOS: 7 days    Time spent: 40 mins    Irine Seal, MD [facility]  If 7PM-7AM, please contact night-coverage [email or web address] 01/14/2019, 10:44 AM

## 2019-01-14 NOTE — Progress Notes (Signed)
Patient became lethargic with slurred speech after receiving compazine. Was able to open eyes but seemed very off. Hand grips equal with no signs of facial asymmetry. Vitals stable. MD notified who came to see patient. Will continue to monitor.

## 2019-01-15 LAB — CBC
HCT: 25 % — ABNORMAL LOW (ref 36.0–46.0)
Hemoglobin: 7.7 g/dL — ABNORMAL LOW (ref 12.0–15.0)
MCH: 27.3 pg (ref 26.0–34.0)
MCHC: 30.8 g/dL (ref 30.0–36.0)
MCV: 88.7 fL (ref 80.0–100.0)
Platelets: 183 10*3/uL (ref 150–400)
RBC: 2.82 MIL/uL — ABNORMAL LOW (ref 3.87–5.11)
RDW: 14.5 % (ref 11.5–15.5)
WBC: 9.3 10*3/uL (ref 4.0–10.5)
nRBC: 0 % (ref 0.0–0.2)

## 2019-01-15 LAB — RENAL FUNCTION PANEL
Albumin: 2.1 g/dL — ABNORMAL LOW (ref 3.5–5.0)
Anion gap: 15 (ref 5–15)
BUN: 39 mg/dL — ABNORMAL HIGH (ref 6–20)
CO2: 21 mmol/L — ABNORMAL LOW (ref 22–32)
Calcium: 8.2 mg/dL — ABNORMAL LOW (ref 8.9–10.3)
Chloride: 95 mmol/L — ABNORMAL LOW (ref 98–111)
Creatinine, Ser: 5.39 mg/dL — ABNORMAL HIGH (ref 0.44–1.00)
GFR calc Af Amer: 10 mL/min — ABNORMAL LOW (ref >60)
GFR calc non Af Amer: 8 mL/min — ABNORMAL LOW (ref >60)
Glucose, Bld: 84 mg/dL (ref 70–99)
Phosphorus: 3.9 mg/dL (ref 2.5–4.6)
Potassium: 3.7 mmol/L (ref 3.5–5.1)
Sodium: 131 mmol/L — ABNORMAL LOW (ref 135–145)

## 2019-01-15 MED ORDER — ALPRAZOLAM 0.25 MG PO TABS
0.2500 mg | ORAL_TABLET | Freq: Two times a day (BID) | ORAL | Status: DC | PRN
  Administered 2019-01-17 – 2019-01-18 (×2): 0.25 mg via ORAL
  Filled 2019-01-15 (×2): qty 1

## 2019-01-15 MED ORDER — HALOPERIDOL LACTATE 5 MG/ML IJ SOLN
2.0000 mg | Freq: Once | INTRAMUSCULAR | Status: AC
  Administered 2019-01-15: 2 mg via INTRAVENOUS
  Filled 2019-01-15: qty 1

## 2019-01-15 NOTE — Progress Notes (Signed)
Patient ID: Summer Cook, female   DOB: 07/13/1966, 53 y.o.   MRN: [id number] [facility] Progress Note   Assessment/ Plan:   1. Acute kidney Injury versus progressive chronic kidney disease: Anuric, worsening renal function noted following her last dialysis treatment on Friday.  Previously done renal biopsy report shows TMA from severe hypertension, collapsing FSGS and superimposed ATN with moderate scarring.  There may be a degree of reversibility with regards to the ATN but at this time, will continue expectant monitoring with anticipation that she may need chronic hemodialysis.  If she does not have improving renal function over the next 1 or 2 days, we will pursue plans for outpatient dialysis/permanent access-vein mapping today. 2.  Acute hypoxic respiratory failure: Cleared by pulmonology-low pretest clinical suspicion for COVID-19 with negative testing. 3.  Left extrarenal perinephric hematoma: Status post renal biopsy; hemodynamically stable with downtrending hemoglobin/hematocrit.  No clear evidence of abscess on yesterday's imaging. 4.  Hyperphosphatemia: Secondary to advancing chronic kidney disease and acute kidney injury.  Phosphorus levels at goal on calcium acetate to 667mg  3 times daily AC. 5.  Hyponatremia: Secondary to acute kidney injury/advancing chronic kidney disease-reiterated fluid restriction.    Subjective:   Some confusion and agitation noted earlier this morning.  When I saw the patient, she appears to be placid and reports that she is tired.  Oriented to place and person but vaguely to time.   Objective:   BP 127/85 (BP Location: Left Arm)   Pulse 85   Temp 97.7 F (36.5 C) (Axillary)   Resp 20   Ht 5\' 5"  (1.651 m)   Wt 88.5 kg   LMP 08/13/2011   SpO2 93%   BMI 32.47 kg/m   Intake/Output Summary (Last 24 hours) at 01/15/2019 1024 Last data filed at 01/15/2019 0321 Gross per 24 hour  Intake 3 ml  Output -  Net 3 ml   Weight change:    Physical Exam: Gen: Comfortably resting in bed, oriented to place and person. CVS: Regular rhythm, normal rate, S1 and S2 normal Resp: Decreased breath sounds of bilateral bases, no rales/rhonchi Abd: Soft, flat, nontender Ext: Trace lower extremity edema with SCDs  Imaging: Ct Abdomen Pelvis Wo Contrast  Result Date: 01/14/2019 CLINICAL DATA:  Lymphadenopathy. EXAM: CT ABDOMEN AND PELVIS WITHOUT CONTRAST TECHNIQUE: Multidetector CT imaging of the abdomen and pelvis was performed following the standard protocol without IV contrast. COMPARISON:  CT 01/06/2019 FINDINGS: Lower chest: Bilateral pleural effusions and basilar passive atelectasis similar to slightly increased from comparison exam. Hepatobiliary: Non IV contrast images demonstrate no focal hepatic lesion. Postcholecystectomy. Pancreas: Pancreas is normal. No ductal dilatation. No pancreatic inflammation. Spleen: Post splenectomy Adrenals/urinary tract: A fluid collection in the LEFT perinephric space deep to the LEFT kidney similar to comparison exam and consistent with post a biopsy hematoma. No increased volume of the hematoma. No Organization to suggest abscess. Non IV contrast study noted. The kidneys otherwise are unremarkable on this noncontrast exam. No hydronephrosis. Bladder normal. Stomach/Bowel: Stomach, small-bowel cecum normal. The colon and rectosigmoid colon are normal. The descending colon does exit a LEFT lateral abdominal wall hernia for approximately 3 cm (image 74/6). No evidence obstruction. The hernia is evident on comparison exams but no bowel enters the hernia sac on comparison exams. Vascular/Lymphatic: Abdominal aorta is normal caliber. No periportal or retroperitoneal adenopathy. No pelvic adenopathy. Reproductive: Uterus and ovaries normal. Other: No free fluid. Musculoskeletal: No aggressive osseous lesion. IMPRESSION: 1. Stable perinephric hematoma deep to the  LEFT kidney. No evidence of renal infection on  noncontrast exam. 2. Descending colon has a short segment entering a LEFT lateral wall hernia. No evidence obstruction. 3. Bilateral moderate size pleural effusions with basilar passive atelectasis. Mild increase in effusion volume. Electronically Signed   By: Suzy Bouchard M.D.   On: 01/14/2019 07:00    Labs: BMET Recent Labs  Lab 01/09/19 0729 01/10/19 0234 01/11/19 0258 01/12/19 0418 01/13/19 0229 01/14/19 0309 01/15/19 0332  NA 134*  134* 134*  134* 135  135 138 134* 132* 131*  K 4.8  4.8 4.3  4.4 3.6  3.5 4.0 3.6 3.9 3.7  CL 106  105 106  106 102  101 102 99 95* 95*  CO2 15*  17* 14*  15* 20*  22 22 25 27  21*  GLUCOSE 88  89 89  92 113*  113* 84 84 101* 84  BUN 72*  73* 81*  81* 44*  44* 24* 12 29* 39*  CREATININE 7.50*  7.76* 7.76*  7.86* 5.31*  5.27* 4.04* 2.73* 4.27* 5.39*  CALCIUM 8.0*  8.0* 8.1*  8.1* 8.0*  8.0* 8.0* 7.8* 8.0* 8.2*  PHOS 8.2*  8.2* 8.4*  8.6* 5.3* 4.5 2.9 3.1 3.9   CBC Recent Labs  Lab 01/10/19 0234 01/11/19 0258 01/12/19 0418 01/13/19 0229 01/13/19 1358 01/14/19 0309 01/14/19 1324 01/15/19 0717  WBC 10.2 11.5* 7.6 8.8  --  9.8  --  9.3  NEUTROABS 6.6 9.1* 5.0 6.6  --   --   --   --   HGB 7.8* 8.4* 8.0* 7.2* 7.1* 7.0* 7.7* 7.7*  HCT 24.6* 25.9* 24.9* 23.1* 22.2* 22.7* 23.7* 25.0*  MCV 89.1 87.5 88.3 87.5  --  88.0  --  88.7  PLT 153 155 159 158  --  164  --  183    Medications:    . ALPRAZolam  0.5 mg Oral TID  . atorvastatin  40 mg Oral q1800  . calcium acetate  667 mg Oral TID WC  . Chlorhexidine Gluconate Cloth  6 each Topical Q0600  . guaiFENesin  1,200 mg Oral BID  . hydrALAZINE  25 mg Oral TID  . isosorbide dinitrate  20 mg Oral BID  . mouth rinse  15 mL Mouth Rinse BID  . metoprolol succinate  50 mg Oral Daily  . sertraline  100 mg Oral Daily  . sodium chloride flush  3 mL Intravenous Q12H  . valACYclovir  1,000 mg Oral BID   Elmarie Shiley, MD 01/15/2019, 10:24 AM

## 2019-01-15 NOTE — Progress Notes (Addendum)
PROGRESS NOTE    Summer Cook  ZOX:[id number] DOB: 24-Jul-1966 DOA: 01/06/2019 PCP: Redmond School, MD    Brief Narrative:  53 year old female with history of hypertension, CAD, GERD, ITP status post splenectomy, PE in 2013, chronic kidney disease stage III and recent AKI on CKD who underwent kidney biopsy on 01/03/2019 with subsequent left flank pain since discharge associated with fever and cough.  In the ED, CT scan of the chest abdomen and pelvis showed cardiomegaly with bilateral pleural effusions and findings consistent with pulmonary edema along with patchy bilateral groundglass opacities in both lungs.  CT scan of the abdomen and pelvis showed a recent left renal biopsy with moderate sized left extrarenal perinephric hematoma with a small foci of air within the hematoma raising question of superimposed infection.  She was admitted for the same.  Nephrology was consulted.  During the hospitalization, her respiratory status worsened.  PCCM was also consulted.  COVID-19 was tested which turned out negative.  Initially she was kept on isolation but after discussion with ID, droplet and contact precautions were discontinued.    Assessment & Plan:   Principal Problem:   Acute renal failure superimposed on stage 3 chronic kidney disease (HCC) Active Problems:   Acute respiratory failure with hypoxia (HCC)   Acute on chronic systolic congestive heart failure (HCC)   Essential hypertension   Stage 3 chronic kidney disease (HCC)   Diabetes mellitus type 2, uncomplicated (HCC)   CAD (coronary artery disease): total distal RCA EF normal (05/20/17)   Fever   CKD (chronic kidney disease), stage V (HCC)   Cough   Renal hematoma, left   Iron deficiency anemia   1 acute renal failure on chronic kidney disease stage III versus progressive chronic kidney disease Patient noted to go into worsening acute kidney injury and nonoliguric seen by nephrology and subsequently started on hemodialysis due  to worsening renal function and worsening volume overload.  Patient underwent renal biopsy on 01/03/2019 which per nephrology shows TMA from severe hypertension, collapsing FSGS and superimposed ATN with moderate scarring.  Patient receiving hemodialysis due to worsening renal function and volume overload.  Per nephrology there may be a degree of reversibility with regards to ATN and recommending continued expectant monitoring with anticipation that patient may need chronic hemodialysis.  Patient currently off hemodialysis through the weekend.  Per nephrology if no improvement with renal function over the next 1 to 2 days will need to pursue plans for outpatient dialysis.  Per nephrology.  2.  Acute hypoxic respiratory failure secondary to acute systolic heart failure likely secondary to acute renal failure Patient noted to have a worsening acute hypoxic respiratory failure felt likely secondary to volume overload secondary to acute renal failure.  Patient was seen by pulmonary and felt to have a low pretest clinical suspicion for COVID-19 with a negative test.   Patient with some clinical improvement after being started on hemodialysis.  2D echo with a EF of 35 to 40% with left ventricular diffuse hypokinesis.  Patient noted to have a normal EF on 05/21/2017.  Patient currently has not had hemodialysis this weekend.  Patient being monitored by nephrology. Due to depressed ejection fraction cardiology has been consulted.  Patient underwent a Lexiscan Myoview study which was negative.  Nephrology following.  3.  Fever /left extrarenal perinephric hematoma Status post recent renal biopsy.  Patient hemodynamically stable.  CT abdomen and pelvis which was done was concerning for perinephric hematoma with question for superimposed infection.  Patient  currently afebrile.  Influenza PCR negative.  Respiratory viral panel negative.  COVID-19 testing negative.  Due to worsening respiratory function and due to concern for  COVID-19 patient received a dose of hydroxychloroquine which was subsequently discontinued after tests came back negative.  Case was discussed with ID and by prior hospitalist who recommended discontinuation of airborne and droplet precautions.  Patient on empiric IV antibiotics of cefepime. White count improved and currently at 9.8.  Patient noted to have low-grade temp overnight and CT abdomen and pelvis repeated and negative for abscess formation.  CT abdomen and pelvis shows a stable perinephric hematoma deep to the left kidney.  Bilateral moderate sized pleural effusions with basilar passive atelectasis.  Mild increase in effusion volume.  Patient status post 7-day course of IV cefepime.  Currently afebrile.  No need for any further antibiotics.  Nephrology following.  4.  Iron deficiency anemia Iron level of 12.  TIBC of 249.  Patient receiving IV Nulecit per nephrology.  Status post 1 unit packed red blood cells.  Hemoglobin currently at 7.7 from 7.0 from 7.2 from 8.0 from 8.4 from 6.9. Transfusion threshold hemoglobin less than 7.  Follow H&H.  5.  Metabolic acidosis Secondary to acute kidney injury versus progressive chronic kidney disease.  Patient currently on bicarb tablets which has been discontinued per nephrology.  Per nephrology.  6.  Coronary artery disease status post totally occluded PDA Medically managed prior to admission.  2D echo done with a depressed ejection fraction of 35 to 40% which is worse than prior 2D echo.  Aspirin on hold due to hematoma..  Continue isosorbide, Toprol-XL, hydralazine, Lipitor per cardiology recommendations. Patient underwent Lexiscan Myoview study which was negative.  Outpatient follow-up with cardiology.   7.  Hypertension Blood pressure on current regimen of Toprol-XL, hydralazine, Isordil.  Norvasc discontinued.  Medications adjusted per cardiology.   8.  Obesity Outpatient follow-up.  DVT prophylaxis: SCD Code Status: Full Family  Communication: Updated patient.  Tried calling daughter, Andee Poles however went to voicemail.  Disposition Plan: To be determined.  And when stable and okay with nephrology.   Consultants:   Nephrology: Dr. Justin Mend 01/07/2019  PCCM Dr. Lynetta Mare 01/09/2019  Interventional radiology: Dr. Anselm Pancoast 01/10/2019  Cardiology: Dr. Meda Coffee 01/11/2019  Procedures:  CT chest 01/06/2019  CT renal stone protocol for 07/18/2019  Chest x-ray 01/06/2019, 01/08/2019, 01/09/2019, 01/10/2019  Upper scopic and ultrasound-guided placement of tunneled dialysis catheter per Dr. Anselm Pancoast 01/10/2019  Renal ultrasound 01/09/2019  2D echo 01/10/2019  Transfuse 1 unit packed red blood cells 01/09/2019  Lexiscan Myoview pending 01/12/2019  CT abdomen and pelvis 01/14/2019  Antimicrobials:   IV cefepime 01/07/2019 >>>> 01/13/2019  IV vancomycin 01/10/2019 x 1 dose    Subjective: Patient somewhat drowsy with some confusion and agitation per RN early on this morning.  Patient reported did not sleep much last night and somewhat just tired.  Patient oriented x2.  Patient denies any chest pain.  No shortness of breath.   Objective: Vitals:   01/15/19 0500 01/15/19 0544 01/15/19 0800 01/15/19 0849  BP: 132/87 126/78  127/85  Pulse: 91 88  85  Resp: (!) 24 17  20   Temp:   97.7 F (36.5 C)   TempSrc:   Axillary   SpO2: 92% 95%  93%  Weight:      Height:        Intake/Output Summary (Last 24 hours) at 01/15/2019 1234 Last data filed at 01/15/2019 0921 Gross per 24 hour  Intake 23  ml  Output -  Net 23 ml   Filed Weights   01/12/19 1433 01/12/19 1806 01/15/19 0450  Weight: 89.1 kg 87.6 kg 88.5 kg    Examination:  General exam: NAD Respiratory system: Clear to auscultation bilaterally anterior lung fields.  No wheezes, no crackles, no rhonchi. Cardiovascular system: RRR no murmurs rubs or gallops.  No JVD.  No lower extremity edema.  Gastrointestinal system: Abdomen is soft, nontender, nondistended, positive bowel  sounds.  No rebound.  No guarding.  Central nervous system: Drowsy.. No focal neurological deficits. Extremities: Symmetric 5 x 5 power. Skin: No rashes, lesions or ulcers Psychiatry: Judgement and insight appear fair. Mood & affect appropriate.     Data Reviewed: I have personally reviewed following labs and imaging studies  CBC: Recent Labs  Lab 01/09/19 0729 01/10/19 0234 01/11/19 0258 01/12/19 0418 01/13/19 0229 01/13/19 1358 01/14/19 0309 01/14/19 1324 01/15/19 0717  WBC 9.6 10.2 11.5* 7.6 8.8  --  9.8  --  9.3  NEUTROABS 5.9 6.6 9.1* 5.0 6.6  --   --   --   --   HGB 6.9* 7.8* 8.4* 8.0* 7.2* 7.1* 7.0* 7.7* 7.7*  HCT 22.5* 24.6* 25.9* 24.9* 23.1* 22.2* 22.7* 23.7* 25.0*  MCV 89.6 89.1 87.5 88.3 87.5  --  88.0  --  88.7  PLT 149* 153 155 159 158  --  164  --  433   Basic Metabolic Panel: Recent Labs  Lab 01/09/19 0729 01/10/19 0234 01/11/19 0258 01/12/19 0418 01/13/19 0229 01/14/19 0309 01/15/19 0332  NA 134*  134* 134*  134* 135  135 138 134* 132* 131*  K 4.8  4.8 4.3  4.4 3.6  3.5 4.0 3.6 3.9 3.7  CL 106  105 106  106 102  101 102 99 95* 95*  CO2 15*  17* 14*  15* 20*  22 22 25 27  21*  GLUCOSE 88  89 89  92 113*  113* 84 84 101* 84  BUN 72*  73* 81*  81* 44*  44* 24* 12 29* 39*  CREATININE 7.50*  7.76* 7.76*  7.86* 5.31*  5.27* 4.04* 2.73* 4.27* 5.39*  CALCIUM 8.0*  8.0* 8.1*  8.1* 8.0*  8.0* 8.0* 7.8* 8.0* 8.2*  MG 2.0 2.0 2.0 1.8 2.6*  --   --   PHOS 8.2*  8.2* 8.4*  8.6* 5.3* 4.5 2.9 3.1 3.9   GFR: Estimated Creatinine Clearance: 13.3 mL/min (A) (by C-G formula based on SCr of 5.39 mg/dL (H)). Liver Function Tests: Recent Labs  Lab 01/09/19 0729 01/10/19 0234 01/11/19 0258 01/12/19 0418 01/13/19 0229 01/14/19 0309 01/15/19 0332  AST 11* 12* 15  --   --   --   --   ALT 7 8 9   --   --   --   --   ALKPHOS 49 45 56  --   --   --   --   BILITOT 0.8 1.1 1.1  --   --   --   --   PROT 5.1* 5.4* 5.7*  --   --   --   --    ALBUMIN 2.4*  2.4* 2.3*  2.3* 2.4*  2.4* 2.1* 2.1* 2.0* 2.1*   No results for input(s): LIPASE, AMYLASE in the last 168 hours. No results for input(s): AMMONIA in the last 168 hours. Coagulation Profile: No results for input(s): INR, PROTIME in the last 168 hours. Cardiac Enzymes: Recent Labs  Lab 01/09/19 0729 01/10/19 0234 01/11/19 0258  01/12/19 0418  CKTOTAL 33* 28* 36* 25*  TROPONINI  --  0.05*  --   --    BNP (last 3 results) No results for input(s): PROBNP in the last 8760 hours. HbA1C: No results for input(s): HGBA1C in the last 72 hours. CBG: No results for input(s): GLUCAP in the last 168 hours. Lipid Profile: No results for input(s): CHOL, HDL, LDLCALC, TRIG, CHOLHDL, LDLDIRECT in the last 72 hours. Thyroid Function Tests: No results for input(s): TSH, T4TOTAL, FREET4, T3FREE, THYROIDAB in the last 72 hours. Anemia Panel: No results for input(s): VITAMINB12, FOLATE, FERRITIN, TIBC, IRON, RETICCTPCT in the last 72 hours. Sepsis Labs: No results for input(s): PROCALCITON, LATICACIDVEN in the last 168 hours.  Recent Results (from the past 240 hour(s))  Blood Culture (routine x 2)     Status: None   Collection Time: 01/06/19  9:56 PM  Result Value Ref Range Status   Specimen Description RIGHT ANTECUBITAL  Final   Special Requests   Final    BOTTLES DRAWN AEROBIC AND ANAEROBIC Blood Culture adequate volume   Culture   Final    NO GROWTH 5 DAYS Performed at [facility], [address] [place], [place] [address]    Report Status 01/11/2019 FINAL  Final  Blood Culture (routine x 2)     Status: None   Collection Time: 01/06/19 10:00 PM  Result Value Ref Range Status   Specimen Description LEFT ANTECUBITAL  Final   Special Requests   Final    BOTTLES DRAWN AEROBIC AND ANAEROBIC Blood Culture adequate volume   Culture   Final    NO GROWTH 5 DAYS Performed at [facility], [address] [place], [place] [address]    Report Status 01/11/2019 FINAL  Final   Respiratory Panel by PCR     Status: None   Collection Time: 01/07/19  7:20 AM  Result Value Ref Range Status   Adenovirus NOT DETECTED NOT DETECTED Final   Coronavirus 229E NOT DETECTED NOT DETECTED Final    Comment: (NOTE) The Coronavirus on the Respiratory Panel, DOES NOT test for the novel  Coronavirus (2019 nCoV)    Coronavirus HKU1 NOT DETECTED NOT DETECTED Final   Coronavirus NL63 NOT DETECTED NOT DETECTED Final   Coronavirus OC43 NOT DETECTED NOT DETECTED Final   Metapneumovirus NOT DETECTED NOT DETECTED Final   Rhinovirus / Enterovirus NOT DETECTED NOT DETECTED Final   Influenza A NOT DETECTED NOT DETECTED Final   Influenza B NOT DETECTED NOT DETECTED Final   Parainfluenza Virus 1 NOT DETECTED NOT DETECTED Final   Parainfluenza Virus 2 NOT DETECTED NOT DETECTED Final   Parainfluenza Virus 3 NOT DETECTED NOT DETECTED Final   Parainfluenza Virus 4 NOT DETECTED NOT DETECTED Final   Respiratory Syncytial Virus NOT DETECTED NOT DETECTED Final   Bordetella pertussis NOT DETECTED NOT DETECTED Final   Chlamydophila pneumoniae NOT DETECTED NOT DETECTED Final   Mycoplasma pneumoniae NOT DETECTED NOT DETECTED Final    Comment: Performed at [facility], [place] [address], [place], [place] [address]  Novel Coronavirus, NAA (hospital order; send-out to ref lab)     Status: None   Collection Time: 01/07/19  7:30 AM  Result Value Ref Range Status   SARS-CoV-2, NAA NOT DETECTED NOT DETECTED Final    Comment: Performed at [facility] Performed at [facility], [place] [address] [place],  [address]    Coronavirus Source NASOPHARYNGEAL  Final    Comment: Performed at [facility], [address] [place], [place] [address]  MRSA PCR Screening     Status: None   Collection Time: 01/13/19  1:52 AM  Result Value Ref Range Status   MRSA by PCR NEGATIVE NEGATIVE Final    Comment:        The GeneXpert MRSA Assay (FDA approved for NASAL specimens only), is one component  of a comprehensive MRSA colonization surveillance program. It is not intended to diagnose MRSA infection nor to guide or monitor treatment for MRSA infections. Performed at [facility], [place] [address], [place], [place] [address]          Radiology Studies: Ct Abdomen Pelvis Wo Contrast  Result Date: 01/14/2019 CLINICAL DATA:  Lymphadenopathy. EXAM: CT ABDOMEN AND PELVIS WITHOUT CONTRAST TECHNIQUE: Multidetector CT imaging of the abdomen and pelvis was performed following the standard protocol without IV contrast. COMPARISON:  CT 01/06/2019 FINDINGS: Lower chest: Bilateral pleural effusions and basilar passive atelectasis similar to slightly increased from comparison exam. Hepatobiliary: Non IV contrast images demonstrate no focal hepatic lesion. Postcholecystectomy. Pancreas: Pancreas is normal. No ductal dilatation. No pancreatic inflammation. Spleen: Post splenectomy Adrenals/urinary tract: A fluid collection in the LEFT perinephric space deep to the LEFT kidney similar to comparison exam and consistent with post a biopsy hematoma. No increased volume of the hematoma. No Organization to suggest abscess. Non IV contrast study noted. The kidneys otherwise are unremarkable on this noncontrast exam. No hydronephrosis. Bladder normal. Stomach/Bowel: Stomach, small-bowel cecum normal. The colon and rectosigmoid colon are normal. The descending colon does exit a LEFT lateral abdominal wall hernia for approximately 3 cm (image 74/6). No evidence obstruction. The hernia is evident on comparison exams but no bowel enters the hernia sac on comparison exams. Vascular/Lymphatic: Abdominal aorta is normal caliber. No periportal or retroperitoneal adenopathy. No pelvic adenopathy. Reproductive: Uterus and ovaries normal. Other: No free fluid. Musculoskeletal: No aggressive osseous lesion. IMPRESSION: 1. Stable perinephric hematoma deep to the LEFT kidney. No evidence of renal infection on noncontrast  exam. 2. Descending colon has a short segment entering a LEFT lateral wall hernia. No evidence obstruction. 3. Bilateral moderate size pleural effusions with basilar passive atelectasis. Mild increase in effusion volume. Electronically Signed   By: Suzy Bouchard M.D.   On: 01/14/2019 07:00        Scheduled Meds: . ALPRAZolam  0.5 mg Oral TID  . atorvastatin  40 mg Oral q1800  . calcium acetate  667 mg Oral TID WC  . Chlorhexidine Gluconate Cloth  6 each Topical Q0600  . guaiFENesin  1,200 mg Oral BID  . hydrALAZINE  25 mg Oral TID  . isosorbide dinitrate  20 mg Oral BID  . mouth rinse  15 mL Mouth Rinse BID  . metoprolol succinate  50 mg Oral Daily  . sertraline  100 mg Oral Daily  . sodium chloride flush  3 mL Intravenous Q12H  . valACYclovir  1,000 mg Oral BID   Continuous Infusions: . sodium chloride 250 mL (01/11/19 1146)  . ferric gluconate (FERRLECIT/NULECIT) IV 125 mg (01/14/19 2243)     LOS: 8 days    Time spent: 11 mins    Irine Seal, MD [facility]  If 7PM-7AM, please contact night-coverage [email or web address] 01/15/2019, 12:34 PM

## 2019-01-15 NOTE — Progress Notes (Signed)
Patient rested most of the morning but became restless this afternoon. Began screaming, but denies pain, and still able able to state where she is and the year but seems delirious at times. Scheduled xanax given. Spoke with daughter, Andee Poles who appeared upset that xanax was given. MD notified about daughter's concern. Will continue to monitor.

## 2019-01-15 NOTE — Progress Notes (Signed)
Patient stated the she had to use the restroom upon placing her on the bed pan, patient became very upset. Patient begin kicking, screaming, and defecating all over herself a number of times. Pt was very disoriented. Patient's daughter was contacted. Patient was unconsolably. Doctor was paged,  2 mg of Haldol IV was ordered. Patient began to calm down somewhat. Vital signs stable, IV consult for a dressing change. Will continue to monitor.

## 2019-01-16 ENCOUNTER — Encounter (HOSPITAL_COMMUNITY): Payer: Medicare Other

## 2019-01-16 LAB — RENAL FUNCTION PANEL
Albumin: 2.3 g/dL — ABNORMAL LOW (ref 3.5–5.0)
Anion gap: 15 (ref 5–15)
BUN: 50 mg/dL — ABNORMAL HIGH (ref 6–20)
CO2: 19 mmol/L — ABNORMAL LOW (ref 22–32)
Calcium: 8.3 mg/dL — ABNORMAL LOW (ref 8.9–10.3)
Chloride: 95 mmol/L — ABNORMAL LOW (ref 98–111)
Creatinine, Ser: 6.2 mg/dL — ABNORMAL HIGH (ref 0.44–1.00)
GFR calc Af Amer: 8 mL/min — ABNORMAL LOW (ref >60)
GFR calc non Af Amer: 7 mL/min — ABNORMAL LOW (ref >60)
Glucose, Bld: 81 mg/dL (ref 70–99)
Phosphorus: 4.8 mg/dL — ABNORMAL HIGH (ref 2.5–4.6)
Potassium: 3.8 mmol/L (ref 3.5–5.1)
Sodium: 129 mmol/L — ABNORMAL LOW (ref 135–145)

## 2019-01-16 LAB — AMMONIA: Ammonia: 11 umol/L (ref 9–35)

## 2019-01-16 LAB — HEMOGLOBIN AND HEMATOCRIT, BLOOD
HCT: 24.4 % — ABNORMAL LOW (ref 36.0–46.0)
Hemoglobin: 7.6 g/dL — ABNORMAL LOW (ref 12.0–15.0)

## 2019-01-16 MED ORDER — CHLORHEXIDINE GLUCONATE CLOTH 2 % EX PADS
6.0000 | MEDICATED_PAD | Freq: Every day | CUTANEOUS | Status: DC
  Administered 2019-01-17: 06:00:00 6 via TOPICAL

## 2019-01-16 MED ORDER — HEPARIN SODIUM (PORCINE) 1000 UNIT/ML IJ SOLN
INTRAMUSCULAR | Status: AC
  Filled 2019-01-16: qty 1

## 2019-01-16 MED ORDER — ALTEPLASE 2 MG IJ SOLR: 2.0000 mg | Freq: Once | INTRAMUSCULAR | Status: DC | PRN

## 2019-01-16 MED ORDER — LIDOCAINE-PRILOCAINE 2.5-2.5 % EX CREA: 1.0000 "application " | TOPICAL_CREAM | CUTANEOUS | Status: DC | PRN

## 2019-01-16 MED ORDER — PENTAFLUOROPROP-TETRAFLUOROETH EX AERO: 1.0000 "application " | INHALATION_SPRAY | CUTANEOUS | Status: DC | PRN

## 2019-01-16 MED ORDER — HEPARIN SODIUM (PORCINE) 1000 UNIT/ML DIALYSIS: 1000.0000 [IU] | INTRAMUSCULAR | Status: DC | PRN

## 2019-01-16 MED ORDER — LIDOCAINE HCL (PF) 1 % IJ SOLN: 5.0000 mL | INTRAMUSCULAR | Status: DC | PRN

## 2019-01-16 MED ORDER — CHLORHEXIDINE GLUCONATE CLOTH 2 % EX PADS: 6.0000 | MEDICATED_PAD | Freq: Every day | CUTANEOUS | Status: DC

## 2019-01-16 MED ORDER — SODIUM CHLORIDE 0.9 % IV SOLN: 100.0000 mL | INTRAVENOUS | Status: DC | PRN

## 2019-01-16 NOTE — Progress Notes (Signed)
PROGRESS NOTE    Summer Cook  DUK:[id number] DOB: 12/12/1965 DOA: 01/06/2019 PCP: Redmond School, MD    Brief Narrative:  53 year old female with history of hypertension, CAD, GERD, ITP status post splenectomy, PE in 2013, chronic kidney disease stage III and recent AKI on CKD who underwent kidney biopsy on 01/03/2019 with subsequent left flank pain since discharge associated with fever and cough.  In the ED, CT scan of the chest abdomen and pelvis showed cardiomegaly with bilateral pleural effusions and findings consistent with pulmonary edema along with patchy bilateral groundglass opacities in both lungs.  CT scan of the abdomen and pelvis showed a recent left renal biopsy with moderate sized left extrarenal perinephric hematoma with a small foci of air within the hematoma raising question of superimposed infection.  She was admitted for the same.  Nephrology was consulted.  During the hospitalization, her respiratory status worsened.  PCCM was also consulted.  COVID-19 was tested which turned out negative.  Initially she was kept on isolation but after discussion with ID, droplet and contact precautions were discontinued.    Assessment & Plan:   Principal Problem:   Acute renal failure superimposed on stage 3 chronic kidney disease (HCC) Active Problems:   Acute respiratory failure with hypoxia (HCC)   Acute on chronic systolic congestive heart failure (HCC)   Essential hypertension   Stage 3 chronic kidney disease (HCC)   Diabetes mellitus type 2, uncomplicated (HCC)   CAD (coronary artery disease): total distal RCA EF normal (05/20/17)   Fever   CKD (chronic kidney disease), stage V (HCC)   Cough   Renal hematoma, left   Iron deficiency anemia   1 acute renal failure on chronic kidney disease stage III versus progressive chronic kidney disease Patient noted to go into worsening acute kidney injury and nonoliguric seen by nephrology and subsequently started on hemodialysis due  to worsening renal function and worsening volume overload.  Patient underwent renal biopsy on 01/03/2019 which per nephrology shows TMA from severe hypertension, collapsing FSGS and superimposed ATN with moderate scarring.  Patient receiving hemodialysis due to worsening renal function and volume overload.  Per if no there may be a degree of reversibility with regards to ATN and recommending continued expectant monitoring with anticipation that patient may need chronic hemodialysis.  Patient was off hemodialysis over this past weekend. Per nephrology if no improvement with renal function over the next 1 to 2 days will need to pursue plans for outpatient dialysis.  Permanent access vein mapping has been ordered per nephrology.  Per nephrology if no improvement will need vascular consult for permanent access.  Per nephrology.  2.  Acute hypoxic respiratory failure secondary to acute systolic heart failure likely secondary to acute renal failure Patient noted to have a worsening acute hypoxic respiratory failure felt likely secondary to volume overload secondary to acute renal failure.  Patient was seen by pulmonary and felt to have a low pretest clinical suspicion for COVID-19 with a negative test.   Patient with clinical improvement after being started on hemodialysis.  2D echo with a EF of 35 to 40% with left ventricular diffuse hypokinesis.  Patient noted to have a normal EF on 05/21/2017.  Patient currently has not had hemodialysis this past weekend.  Patient being monitored by nephrology. Due to depressed ejection fraction cardiology has been consulted.  Patient underwent a Lexiscan Myoview study which was negative.  Nephrology following.  3.  Fever /left extrarenal perinephric hematoma Status post recent renal biopsy.  Patient hemodynamically stable.  CT abdomen and pelvis which was done was concerning for perinephric hematoma with question for superimposed infection.  Patient currently afebrile.  Influenza  PCR negative.  Respiratory viral panel negative.  COVID-19 testing negative.  Due to worsening respiratory function and due to concern for COVID-19 patient received a dose of hydroxychloroquine which was subsequently discontinued after tests came back negative.  Case was discussed with ID and by prior hospitalist who recommended discontinuation of airborne and droplet precautions.  Patient on empiric IV antibiotics of cefepime. White count improved and currently at 9.8.  Patient noted to have low-grade temp overnight and CT abdomen and pelvis repeated and negative for abscess formation.  CT abdomen and pelvis shows a stable perinephric hematoma deep to the left kidney.  Bilateral moderate sized pleural effusions with basilar passive atelectasis.  Mild increase in effusion volume.  Patient status post 7-day course of IV cefepime.  Currently afebrile.  No need for any further antibiotics.  Nephrology following.  4.  Iron deficiency anemia Iron level of 12.  TIBC of 249.  Patient receiving IV Nulecit per nephrology.  Status post 1 unit packed red blood cells.  Hemoglobin currently at 7.6 from 7.7 from 7.0 from 7.2 from 8.0 from 8.4 from 6.9. Transfusion threshold hemoglobin less than 7.  Follow H&H.  5.  Metabolic acidosis Secondary to acute kidney injury versus progressive chronic kidney disease.  Patient currently on bicarb tablets which has been discontinued per nephrology.  Per nephrology.  6.  Coronary artery disease status post totally occluded PDA Medically managed prior to admission.  2D echo done with a depressed ejection fraction of 35 to 40% which is worse than prior 2D echo.  Aspirin on hold due to hematoma..  Continue isosorbide, Toprol-XL, hydralazine, Lipitor per cardiology recommendations. Patient underwent Lexiscan Myoview study which was negative.  Outpatient follow-up with cardiology.   7.  Hypertension Blood pressure improved on current regimen of Toprol-XL, hydralazine, Isordil.   Norvasc discontinued.  Medications adjusted per cardiology.   8.  Obesity Outpatient follow-up.  DVT prophylaxis: SCD Code Status: Full Family Communication: Updated patient.  Disposition Plan: To be determined.  And when stable and okay with nephrology.   Consultants:   Nephrology: Dr. Justin Mend 01/07/2019  PCCM Dr. Lynetta Mare 01/09/2019  Interventional radiology: Dr. Anselm Pancoast 01/10/2019  Cardiology: Dr. Meda Coffee 01/11/2019  Procedures:  CT chest 01/06/2019  CT renal stone protocol for 07/18/2019  Chest x-ray 01/06/2019, 01/08/2019, 01/09/2019, 01/10/2019  Upper scopic and ultrasound-guided placement of tunneled dialysis catheter per Dr. Anselm Pancoast 01/10/2019  Renal ultrasound 01/09/2019  2D echo 01/10/2019  Transfuse 1 unit packed red blood cells 01/09/2019  Lexiscan Myoview pending 01/12/2019  CT abdomen and pelvis 01/14/2019  Antimicrobials:   IV cefepime 01/07/2019 >>>> 01/13/2019  IV vancomycin 01/10/2019 x 1 dose    Subjective: Patient sitting up in chair.  More alert today.  Somewhat confused.  Denies any chest pain.  No shortness of breath.  Wanting to be placed back in bed.  Objective: Vitals:   01/16/19 0553 01/16/19 0816 01/16/19 0819 01/16/19 1157  BP:   123/78   Pulse:   83   Resp:   17   Temp:  (!) 97.2 F (36.2 C)  (!) 97.5 F (36.4 C)  TempSrc:  Oral  Oral  SpO2:   94%   Weight: 86.3 kg     Height:        Intake/Output Summary (Last 24 hours) at 01/16/2019 1253 Last data filed at  01/16/2019 1032 Gross per 24 hour  Intake 150 ml  Output -  Net 150 ml   Filed Weights   01/12/19 1806 01/15/19 0450 01/16/19 0553  Weight: 87.6 kg 88.5 kg 86.3 kg    Examination:  General exam: NAD Respiratory system: CTAB.  No wheezes, no crackles, no rhonchi.   Cardiovascular system: Regular rate rhythm no murmurs rubs or gallops.  No JVD.  No lower extremity edema. Gastrointestinal system: Abdomen is nontender, nondistended, soft, positive bowel sounds.  No rebound.  No  guarding.  Central nervous system: More alert today. No focal neurological deficits. Extremities: Symmetric 5 x 5 power. Skin: No rashes, lesions or ulcers Psychiatry: Judgement and insight appear fair. Mood & affect appropriate.     Data Reviewed: I have personally reviewed following labs and imaging studies  CBC: Recent Labs  Lab 01/10/19 0234 01/11/19 0258 01/12/19 0418 01/13/19 0229 01/13/19 1358 01/14/19 0309 01/14/19 1324 01/15/19 0717 01/16/19 0351  WBC 10.2 11.5* 7.6 8.8  --  9.8  --  9.3  --   NEUTROABS 6.6 9.1* 5.0 6.6  --   --   --   --   --   HGB 7.8* 8.4* 8.0* 7.2* 7.1* 7.0* 7.7* 7.7* 7.6*  HCT 24.6* 25.9* 24.9* 23.1* 22.2* 22.7* 23.7* 25.0* 24.4*  MCV 89.1 87.5 88.3 87.5  --  88.0  --  88.7  --   PLT 153 155 159 158  --  164  --  183  --    Basic Metabolic Panel: Recent Labs  Lab 01/10/19 0234 01/11/19 0258 01/12/19 0418 01/13/19 0229 01/14/19 0309 01/15/19 0332 01/16/19 0351  NA 134*  134* 135  135 138 134* 132* 131* 129*  K 4.3  4.4 3.6  3.5 4.0 3.6 3.9 3.7 3.8  CL 106  106 102  101 102 99 95* 95* 95*  CO2 14*  15* 20*  22 22 25 27  21* 19*  GLUCOSE 89  92 113*  113* 84 84 101* 84 81  BUN 81*  81* 44*  44* 24* 12 29* 39* 50*  CREATININE 7.76*  7.86* 5.31*  5.27* 4.04* 2.73* 4.27* 5.39* 6.20*  CALCIUM 8.1*  8.1* 8.0*  8.0* 8.0* 7.8* 8.0* 8.2* 8.3*  MG 2.0 2.0 1.8 2.6*  --   --   --   PHOS 8.4*  8.6* 5.3* 4.5 2.9 3.1 3.9 4.8*   GFR: Estimated Creatinine Clearance: 11.4 mL/min (A) (by C-G formula based on SCr of 6.2 mg/dL (H)). Liver Function Tests: Recent Labs  Lab 01/10/19 0234 01/11/19 0258 01/12/19 0418 01/13/19 0229 01/14/19 0309 01/15/19 0332 01/16/19 0351  AST 12* 15  --   --   --   --   --   ALT 8 9  --   --   --   --   --   ALKPHOS 45 56  --   --   --   --   --   BILITOT 1.1 1.1  --   --   --   --   --   PROT 5.4* 5.7*  --   --   --   --   --   ALBUMIN 2.3*  2.3* 2.4*  2.4* 2.1* 2.1* 2.0* 2.1* 2.3*   No  results for input(s): LIPASE, AMYLASE in the last 168 hours. Recent Labs  Lab 01/16/19 0351  AMMONIA 11   Coagulation Profile: No results for input(s): INR, PROTIME in the last 168 hours. Cardiac Enzymes: Recent  Labs  Lab 01/10/19 0234 01/11/19 0258 01/12/19 0418  CKTOTAL 28* 36* 25*  TROPONINI 0.05*  --   --    BNP (last 3 results) No results for input(s): PROBNP in the last 8760 hours. HbA1C: No results for input(s): HGBA1C in the last 72 hours. CBG: No results for input(s): GLUCAP in the last 168 hours. Lipid Profile: No results for input(s): CHOL, HDL, LDLCALC, TRIG, CHOLHDL, LDLDIRECT in the last 72 hours. Thyroid Function Tests: No results for input(s): TSH, T4TOTAL, FREET4, T3FREE, THYROIDAB in the last 72 hours. Anemia Panel: No results for input(s): VITAMINB12, FOLATE, FERRITIN, TIBC, IRON, RETICCTPCT in the last 72 hours. Sepsis Labs: No results for input(s): PROCALCITON, LATICACIDVEN in the last 168 hours.  Recent Results (from the past 240 hour(s))  Blood Culture (routine x 2)     Status: None   Collection Time: 01/06/19  9:56 PM  Result Value Ref Range Status   Specimen Description RIGHT ANTECUBITAL  Final   Special Requests   Final    BOTTLES DRAWN AEROBIC AND ANAEROBIC Blood Culture adequate volume   Culture   Final    NO GROWTH 5 DAYS Performed at [facility], [address] [place], [place] [address]    Report Status 01/11/2019 FINAL  Final  Blood Culture (routine x 2)     Status: None   Collection Time: 01/06/19 10:00 PM  Result Value Ref Range Status   Specimen Description LEFT ANTECUBITAL  Final   Special Requests   Final    BOTTLES DRAWN AEROBIC AND ANAEROBIC Blood Culture adequate volume   Culture   Final    NO GROWTH 5 DAYS Performed at [facility], [address] [place], [place] [address]    Report Status 01/11/2019 FINAL  Final  Respiratory Panel by PCR     Status: None   Collection Time: 01/07/19  7:20 AM  Result Value Ref Range  Status   Adenovirus NOT DETECTED NOT DETECTED Final   Coronavirus 229E NOT DETECTED NOT DETECTED Final    Comment: (NOTE) The Coronavirus on the Respiratory Panel, DOES NOT test for the novel  Coronavirus (2019 nCoV)    Coronavirus HKU1 NOT DETECTED NOT DETECTED Final   Coronavirus NL63 NOT DETECTED NOT DETECTED Final   Coronavirus OC43 NOT DETECTED NOT DETECTED Final   Metapneumovirus NOT DETECTED NOT DETECTED Final   Rhinovirus / Enterovirus NOT DETECTED NOT DETECTED Final   Influenza A NOT DETECTED NOT DETECTED Final   Influenza B NOT DETECTED NOT DETECTED Final   Parainfluenza Virus 1 NOT DETECTED NOT DETECTED Final   Parainfluenza Virus 2 NOT DETECTED NOT DETECTED Final   Parainfluenza Virus 3 NOT DETECTED NOT DETECTED Final   Parainfluenza Virus 4 NOT DETECTED NOT DETECTED Final   Respiratory Syncytial Virus NOT DETECTED NOT DETECTED Final   Bordetella pertussis NOT DETECTED NOT DETECTED Final   Chlamydophila pneumoniae NOT DETECTED NOT DETECTED Final   Mycoplasma pneumoniae NOT DETECTED NOT DETECTED Final    Comment: Performed at [facility], [place] [address], [place], [place] [address]  Novel Coronavirus, NAA (hospital order; send-out to ref lab)     Status: None   Collection Time: 01/07/19  7:30 AM  Result Value Ref Range Status   SARS-CoV-2, NAA NOT DETECTED NOT DETECTED Final    Comment: Performed at [facility] Performed at [facility], [place] [address], [place],  [address]    Coronavirus Source NASOPHARYNGEAL  Final    Comment: Performed  at [facility], [address], [place], [place] [address]  MRSA PCR Screening     Status: None   Collection Time: 01/13/19  1:52 AM  Result Value Ref Range Status   MRSA by PCR NEGATIVE NEGATIVE Final    Comment:        The GeneXpert MRSA Assay (FDA approved for NASAL specimens only), is one component of a comprehensive MRSA colonization surveillance program. It is not intended to diagnose MRSA infection  nor to guide or monitor treatment for MRSA infections. Performed at [facility], [place] [address] [place], [place] [address]          Radiology Studies: No results found.      Scheduled Meds: . atorvastatin  40 mg Oral q1800  . calcium acetate  667 mg Oral TID WC  . Chlorhexidine Gluconate Cloth  6 each Topical Q0600  . Chlorhexidine Gluconate Cloth  6 each Topical Q0600  . [START ON 01/17/2019] Chlorhexidine Gluconate Cloth  6 each Topical Q0600  . [START ON 01/17/2019] Chlorhexidine Gluconate Cloth  6 each Topical Q0600  . guaiFENesin  1,200 mg Oral BID  . hydrALAZINE  25 mg Oral TID  . isosorbide dinitrate  20 mg Oral BID  . mouth rinse  15 mL Mouth Rinse BID  . metoprolol succinate  50 mg Oral Daily  . sertraline  100 mg Oral Daily  . sodium chloride flush  3 mL Intravenous Q12H  . valACYclovir  1,000 mg Oral BID   Continuous Infusions: . sodium chloride 250 mL (01/11/19 1146)     LOS: 9 days    Time spent: 35 mins    Irine Seal, MD [facility]  If 7PM-7AM, please contact night-coverage [email or web address] 01/16/2019, 12:53 PM

## 2019-01-16 NOTE — Progress Notes (Signed)
Patient ID: Summer Cook, female   DOB: Dec 21, 1965, 53 y.o.   MRN: [id number] [facility] Progress Note   Assessment/ Plan:   1. 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  There may be a degree of reversibility with regards to the ATN but at this time, will continue expectant monitoring with anticipation that she may need chronic hemodialysis - HD today  - If she does not have improving renal function over the next 1 or 2 days, we will pursue plans for outpatient dialysis - Permanent access-vein mapping ordered - If no improvement will need vascular consult for permanent access  - baseline Cr 1.5 - 2.0 per Epic data  2.  Acute hypoxic respiratory failure: Cleared by pulmonology-low pretest clinical suspicion for COVID-19 with negative testing.  3.  Left extrarenal perinephric hematoma: Status post renal biopsy; hemodynamically stable with downtrending hemoglobin/hematocrit.  No clear evidence of abscess   4.  Hyperphosphatemia: Secondary to advancing chronic kidney disease and acute kidney injury.  Phosphorus levels at goal on calcium acetate to 667mg  3 times daily AC.   5.  Hyponatremia: Secondary to acute kidney injury/advancing chronic kidney disease. Expect improvement with HD today   6. Anemia  - Acute blood loss and AKI   7. HTN  -Controlled  [name], MD 01/16/2019 7:11am     Subjective:   She made minimal output overnight per nursing.  Not yet charted.  She has been awake but confused with staff. Hasn't yet had vein mapping   Review of systems;  Limited by mental status though denies n/v, shortness of breath or chest pain  Objective:   BP 115/72 (BP Location: Right Arm)   Pulse 89   Temp 98.8 F (37.1 C) (Axillary)   Resp (!) 26   Ht 5\' 5"  (1.651 m)   Wt 86.3 kg   LMP 08/13/2011   SpO2 93%    BMI 31.66 kg/m   Intake/Output Summary (Last 24 hours) at 01/16/2019 0710 Last data filed at 01/15/2019 1730 Gross per 24 hour  Intake 73 ml  Output -  Net 73 ml   Weight change: -2.2 kg  Physical Exam:  Gen: adult female in bed in no acute distress  CVS:RRR; S1S2; no rubs Resp: clear and unlabored Abd: Soft, flat, nontender Ext: no lower extremity edema  Imaging: No results found.   Labs: BMET Recent Labs  Lab 01/10/19 0234 01/11/19 0258 01/12/19 0418 01/13/19 0229 01/14/19 0309 01/15/19 0332 01/16/19 0351  NA 134*  134* 135  135 138 134* 132* 131* 129*  K 4.3  4.4 3.6  3.5 4.0 3.6 3.9 3.7 3.8  CL 106  106 102  101 102 99 95* 95* 95*  CO2 14*  15* 20*  22 22 25 27  21* 19*  GLUCOSE 89  92 113*  113* 84 84 101* 84 81  BUN 81*  81* 44*  44* 24* 12 29* 39* 50*  CREATININE 7.76*  7.86* 5.31*  5.27* 4.04* 2.73* 4.27* 5.39* 6.20*  CALCIUM 8.1*  8.1* 8.0*  8.0* 8.0* 7.8* 8.0* 8.2* 8.3*  PHOS 8.4*  8.6* 5.3* 4.5 2.9 3.1 3.9 4.8*   CBC Recent Labs  Lab 01/10/19 0234 01/11/19 0258 01/12/19 0418 01/13/19 0229  01/14/19 0309 01/14/19 1324 01/15/19 0717 01/16/19 0351  WBC 10.2 11.5* 7.6 8.8  --  9.8  --  9.3  --  NEUTROABS 6.6 9.1* 5.0 6.6  --   --   --   --   --   HGB 7.8* 8.4* 8.0* 7.2*   < > 7.0* 7.7* 7.7* 7.6*  HCT 24.6* 25.9* 24.9* 23.1*   < > 22.7* 23.7* 25.0* 24.4*  MCV 89.1 87.5 88.3 87.5  --  88.0  --  88.7  --   PLT 153 155 159 158  --  164  --  183  --    < > = values in this interval not displayed.    Medications:    . atorvastatin  40 mg Oral q1800  . calcium acetate  667 mg Oral TID WC  . Chlorhexidine Gluconate Cloth  6 each Topical Q0600  . guaiFENesin  1,200 mg Oral BID  . hydrALAZINE  25 mg Oral TID  . isosorbide dinitrate  20 mg Oral BID  . mouth rinse  15 mL Mouth Rinse BID  . metoprolol succinate  50 mg Oral Daily  . sertraline  100 mg Oral Daily  . sodium chloride flush  3 mL Intravenous Q12H  . valACYclovir  1,000  mg Oral BID

## 2019-01-16 NOTE — Care Management Important Message (Signed)
Important Message  Patient Details  Name: Summer Cook MRN: [id number] Date of Birth: [birth year]   Medicare Important Message Given:  Yes  At the patient request an unsigned IM was left.  Atreus Hasz 01/16/2019, 3:40 PM

## 2019-01-16 NOTE — TOC Progression Note (Signed)
Transition of Care [facility]) - Progression Note    Patient Details  Name: Summer Cook MRN: [id number] Date of Birth: 1966-03-16  Transition of Care [facility]) CM/SW Contact  Sherrilyn Rist - Covering CM Phone Number: [phone] 01/16/2019, 9:42 AM  Clinical Narrative:    acute renal failure on chronic kidney disease stage III versus progressive chronic kidney disease; CM covering for progression of care; worsening renal function, possible need HD.   Expected Discharge Plan: Home/Self Care Barriers to Discharge: No Barriers Identified  Expected Discharge Plan and Services Expected Discharge Plan: Home/Self Care                                   Social Determinants of Health (SDOH) Interventions    Readmission Risk Interventions No flowsheet data found.

## 2019-01-16 NOTE — Telephone Encounter (Signed)
01/16/19-pt currently hospitalized.

## 2019-01-17 ENCOUNTER — Inpatient Hospital Stay (HOSPITAL_COMMUNITY): Payer: Medicare Other

## 2019-01-17 DIAGNOSIS — Z01810 Encounter for preprocedural cardiovascular examination: Secondary | ICD-10-CM

## 2019-01-17 LAB — RENAL FUNCTION PANEL
Albumin: 2.3 g/dL — ABNORMAL LOW (ref 3.5–5.0)
Anion gap: 17 — ABNORMAL HIGH (ref 5–15)
BUN: 16 mg/dL (ref 6–20)
CO2: 21 mmol/L — ABNORMAL LOW (ref 22–32)
Calcium: 7.7 mg/dL — ABNORMAL LOW (ref 8.9–10.3)
Chloride: 96 mmol/L — ABNORMAL LOW (ref 98–111)
Creatinine, Ser: 2.82 mg/dL — ABNORMAL HIGH (ref 0.44–1.00)
GFR calc Af Amer: 21 mL/min — ABNORMAL LOW (ref >60)
GFR calc non Af Amer: 18 mL/min — ABNORMAL LOW (ref >60)
Glucose, Bld: 78 mg/dL (ref 70–99)
Phosphorus: 2.2 mg/dL — ABNORMAL LOW (ref 2.5–4.6)
Potassium: 3.7 mmol/L (ref 3.5–5.1)
Sodium: 134 mmol/L — ABNORMAL LOW (ref 135–145)

## 2019-01-17 LAB — GLUCOSE, CAPILLARY: Glucose-Capillary: 72 mg/dL (ref 70–99)

## 2019-01-17 MED ORDER — CHLORHEXIDINE GLUCONATE CLOTH 2 % EX PADS
6.0000 | MEDICATED_PAD | Freq: Every day | CUTANEOUS | Status: DC
  Administered 2019-01-18 – 2019-01-19 (×2): 6 via TOPICAL

## 2019-01-17 MED ORDER — DARBEPOETIN ALFA 40 MCG/0.4ML IJ SOSY
40.0000 ug | PREFILLED_SYRINGE | INTRAMUSCULAR | Status: AC
  Administered 2019-01-18: 40 ug via INTRAVENOUS
  Filled 2019-01-17: qty 0.4

## 2019-01-17 MED ORDER — DARBEPOETIN ALFA 40 MCG/0.4ML IJ SOSY
40.0000 ug | PREFILLED_SYRINGE | Freq: Once | INTRAMUSCULAR | Status: DC
  Filled 2019-01-17: qty 0.4

## 2019-01-17 NOTE — Progress Notes (Signed)
Patient ID: Summer Cook, female   DOB: 02-11-1966, 53 y.o.   MRN: [id number] [facility] Progress Note   Assessment/ Plan:   1. 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  There may be a degree of reversibility with regards to the ATN but at this time, will continue expectant monitoring with anticipation that she may need chronic hemodialysis - If she does not have improving renal function over the next 1 or 2 days, we will pursue plans for outpatient dialysis.  Note intradialytic rise in creatinine is improving but note timing of AM labs and evening HD on 4/20 - Plan next HD on 4/22.  Assess needs daily - strict ins/outs to assess for renal recovery -ordered and discussed with patient  - Permanent access planning -vein mapping completed.  If no improvement will need vascular consult for permanent access  - baseline Cr 1.5 - 2.0 per Epic data  2.  Acute hypoxic respiratory failure: Cleared by pulmonology-low pretest clinical suspicion for COVID-19 with negative testing.  3.  Left extrarenal perinephric hematoma: Status post renal biopsy; hemodynamically stable.  No clear evidence of abscess   4.  Hyperphosphatemia: Secondary to advancing chronic kidney disease and acute kidney injury.  Now with mild hypophos. Discontinue binders and follow  5.  Hyponatremia: Secondary to acute kidney injury/advancing chronic kidney disease. Improves with HD  6. Anemia  - Acute blood loss and AKI - stable/improving - Will dose aranesp 4/21.  7. HTN  -Controlled  [name], MD 01/17/2019 12:53 pm    Subjective:   Last HD on 4/20 with 1.5 kg UF.   She reports that she hopes that she is able to get off of dialysis.  She has been making urine but not saving it.  Discussed need for strict ins/outs.    Review of systems;  Reports  nausea without vomiting  Reports some shortness of breath  No chest pain   Objective:   BP (!) 154/99 (BP Location: Left Arm)   Pulse 91   Temp 99.6 F (37.6 C) (Oral)   Resp 16   Ht 5\' 5"  (1.651 m)   Wt 87 kg   LMP 08/13/2011   SpO2 97%   BMI 31.92 kg/m   Intake/Output Summary (Last 24 hours) at 01/17/2019 1253 Last data filed at 01/17/2019 0900 Gross per 24 hour  Intake 560 ml  Output 1505 ml  Net -945 ml   Weight change: 2.2 kg  Physical Exam:  Gen: adult female in bed in no acute distress   CVS:RRR; S1S2; no rubs Resp: clear and unlabored Abd: Soft, flat, nontender Ext: no lower extremity edema Neuro - more conversant today   Imaging: Vas [place] Upper Ext Vein Mapping (pre-op Avf)  Result Date: 01/17/2019 UPPER EXTREMITY VEIN MAPPING  Indications: Pre-access. Performing Technologist: Toma Copier RVS  Examination Guidelines: A complete evaluation includes B-mode imaging, spectral Doppler, color Doppler, and power Doppler as needed of all accessible portions of each vessel. Bilateral testing is considered an integral part of a complete examination. Limited examinations for reoccurring indications may be performed as noted. +-----------------+-------------+----------+--------------+ Right Cephalic   Diameter (cm)Depth (cm)   Findings    +-----------------+-------------+----------+--------------+ Prox upper arm       0.58        0.94   Thrombosed0.51 +-----------------+-------------+----------+--------------+ Mid upper arm  0.67        0.78     Thrombosed   +-----------------+-------------+----------+--------------+ Dist upper arm       0.53        0.65     Thrombosed   +-----------------+-------------+----------+--------------+ Antecubital fossa    0.37        0.49     Thrombosed   +-----------------+-------------+----------+--------------+ Prox forearm         0.31        0.86                   +-----------------+-------------+----------+--------------+ Mid forearm          0.33        0.60                  +-----------------+-------------+----------+--------------+ Wrist                0.23        0.49     branching    +-----------------+-------------+----------+--------------+ +-----------------+-------------+----------+-----------------------+ Right Basilic    Diameter (cm)Depth (cm)       Findings         +-----------------+-------------+----------+-----------------------+ Mid upper arm        0.51        1.37                           +-----------------+-------------+----------+-----------------------+ Dist upper arm       0.34        1.24                           +-----------------+-------------+----------+-----------------------+ Antecubital fossa    0.31        0.38                           +-----------------+-------------+----------+-----------------------+ Prox forearm         0.36        0.74                           +-----------------+-------------+----------+-----------------------+ Mid forearm          0.38        1.23                           +-----------------+-------------+----------+-----------------------+ Distal forearm                          not visualized and size +-----------------+-------------+----------+-----------------------+ Wrist                                   not visualized and size +-----------------+-------------+----------+-----------------------+ +-----------------+-------------+----------+-------------------------------+ Left Cephalic    Diameter (cm)Depth (cm)           Findings             +-----------------+-------------+----------+-------------------------------+ Prox upper arm       0.20        1.26                                   +-----------------+-------------+----------+-------------------------------+ Mid upper arm        0.18  0.65                                    +-----------------+-------------+----------+-------------------------------+ Dist upper arm       0.24        0.55                                   +-----------------+-------------+----------+-------------------------------+ Antecubital fossa    0.31        0.50             Thrombosed            +-----------------+-------------+----------+-------------------------------+ Prox forearm         0.34        0.65                                   +-----------------+-------------+----------+-------------------------------+ Mid forearm                             not visualized and IV placement +-----------------+-------------+----------+-------------------------------+ Dist forearm                            not visualized and IV placement +-----------------+-------------+----------+-------------------------------+ +-----------------+-------------+----------+-------------------------------+ Left Basilic     Diameter (cm)Depth (cm)           Findings             +-----------------+-------------+----------+-------------------------------+ Mid upper arm        0.35        2.10                                   +-----------------+-------------+----------+-------------------------------+ Dist upper arm       0.29        1.16                                   +-----------------+-------------+----------+-------------------------------+ Antecubital fossa    0.17        0.45                                   +-----------------+-------------+----------+-------------------------------+ Prox forearm                                    not visualized          +-----------------+-------------+----------+-------------------------------+ Mid forearm                             not visualized and IV placement +-----------------+-------------+----------+-------------------------------+ Elbow                                            IV placement            +-----------------+-------------+----------+-------------------------------+ Wrist  not visualized          +-----------------+-------------+----------+-------------------------------+ *See table(s) above for measurements and observations.  Diagnosing physician:    Preliminary      Labs: BMET Recent Labs  Lab 01/11/19 0258 01/12/19 9798 01/13/19 0229 01/14/19 0309 01/15/19 0332 01/16/19 0351 01/17/19 0258  NA 135  135 138 134* 132* 131* 129* 134*  K 3.6  3.5 4.0 3.6 3.9 3.7 3.8 3.7  CL 102  101 102 99 95* 95* 95* 96*  CO2 20*  22 22 25 27  21* 19* 21*  GLUCOSE 113*  113* 84 84 101* 84 81 78  BUN 44*  44* 24* 12 29* 39* 50* 16  CREATININE 5.31*  5.27* 4.04* 2.73* 4.27* 5.39* 6.20* 2.82*  CALCIUM 8.0*  8.0* 8.0* 7.8* 8.0* 8.2* 8.3* 7.7*  PHOS 5.3* 4.5 2.9 3.1 3.9 4.8* 2.2*   CBC Recent Labs  Lab 01/11/19 0258 01/12/19 0418 01/13/19 0229  01/14/19 0309 01/14/19 1324 01/15/19 0717 01/16/19 0351  WBC 11.5* 7.6 8.8  --  9.8  --  9.3  --   NEUTROABS 9.1* 5.0 6.6  --   --   --   --   --   HGB 8.4* 8.0* 7.2*   < > 7.0* 7.7* 7.7* 7.6*  HCT 25.9* 24.9* 23.1*   < > 22.7* 23.7* 25.0* 24.4*  MCV 87.5 88.3 87.5  --  88.0  --  88.7  --   PLT 155 159 158  --  164  --  183  --    < > = values in this interval not displayed.    Medications:    . atorvastatin  40 mg Oral q1800  . calcium acetate  667 mg Oral TID WC  . Chlorhexidine Gluconate Cloth  6 each Topical Q0600  . guaiFENesin  1,200 mg Oral BID  . hydrALAZINE  25 mg Oral TID  . isosorbide dinitrate  20 mg Oral BID  . mouth rinse  15 mL Mouth Rinse BID  . metoprolol succinate  50 mg Oral Daily  . sertraline  100 mg Oral Daily  . sodium chloride flush  3 mL Intravenous Q12H  . valACYclovir  1,000 mg Oral BID

## 2019-01-17 NOTE — Telephone Encounter (Signed)
01/17/19-pt currently hospitalized.

## 2019-01-17 NOTE — Progress Notes (Signed)
Patient daughter called requesting to speak with the doctor.  Informed the patient the doctor has left for the day.  Daughter requested to speak to MD on call because she has questions regarding her mother . Notified Lamar Blinks, NP.  Will continue to monitor the patient and notify as needed

## 2019-01-17 NOTE — Progress Notes (Signed)
PROGRESS NOTE    Summer Cook  ZOX:[id number] DOB: 11-06-1965 DOA: 01/06/2019 PCP: Redmond School, MD    Brief Narrative:  53 year old female with history of hypertension, CAD, GERD, ITP status post splenectomy, PE in 2013, chronic kidney disease stage III and recent AKI on CKD who underwent kidney biopsy on 01/03/2019 with subsequent left flank pain since discharge associated with fever and cough.  In the ED, CT scan of the chest abdomen and pelvis showed cardiomegaly with bilateral pleural effusions and findings consistent with pulmonary edema along with patchy bilateral groundglass opacities in both lungs.  CT scan of the abdomen and pelvis showed a recent left renal biopsy with moderate sized left extrarenal perinephric hematoma with a small foci of air within the hematoma raising question of superimposed infection.  She was admitted for the same.  Nephrology was consulted.  During the hospitalization, her respiratory status worsened.  PCCM was also consulted.  COVID-19 was tested which turned out negative.  Initially she was kept on isolation but after discussion with ID, droplet and contact precautions were discontinued.    Assessment & Plan:   Principal Problem:   Acute renal failure superimposed on stage 3 chronic kidney disease (HCC) Active Problems:   Acute respiratory failure with hypoxia (HCC)   Acute on chronic systolic congestive heart failure (HCC)   Essential hypertension   Stage 3 chronic kidney disease (HCC)   Diabetes mellitus type 2, uncomplicated (HCC)   CAD (coronary artery disease): total distal RCA EF normal (05/20/17)   Fever   CKD (chronic kidney disease), stage V (HCC)   Cough   Renal hematoma, left   Iron deficiency anemia   1 acute renal failure on chronic kidney disease stage III versus progressive chronic kidney disease Patient noted to go into worsening acute kidney injury and nonoliguric seen by nephrology and subsequently started on hemodialysis due  to worsening renal function and worsening volume overload.  Patient underwent renal biopsy on 01/03/2019 which per nephrology shows TMA from severe hypertension, collapsing FSGS and superimposed ATN with moderate scarring.  Patient receiving hemodialysis due to worsening renal function and volume overload.  Per if no there may be a degree of reversibility with regards to ATN and recommending continued expectant monitoring with anticipation that patient may need chronic hemodialysis.  Patient was off hemodialysis over this past weekend.  Hemodialysis resumed last night 01/16/2019.  Strict I's and O's Daily weights to assess for renal recovery.  Vein mapping has been completed.  Per nephrology if no improvement with renal function will need vascular consult for permanent access. Per nephrology.  2.  Acute hypoxic respiratory failure secondary to acute systolic heart failure likely secondary to acute renal failure Patient noted to have a worsening acute hypoxic respiratory failure felt likely secondary to volume overload secondary to acute renal failure.  Patient was seen by pulmonary and felt to have a low pretest clinical suspicion for COVID-19 with a negative test.   Patient with clinical improvement after being started on hemodialysis.  2D echo with a EF of 35 to 40% with left ventricular diffuse hypokinesis.  Patient noted to have a normal EF on 05/21/2017.  Patient currently has not had hemodialysis this past weekend.  Patient being monitored by nephrology. Due to depressed ejection fraction cardiology has been consulted.  Patient underwent a Lexiscan Myoview study which was negative.  Nephrology following.  3.  Fever /left extrarenal perinephric hematoma Status post recent renal biopsy.  Patient hemodynamically stable.  CT abdomen  and pelvis which was done was concerning for perinephric hematoma with question for superimposed infection.  Patient currently afebrile.  Influenza PCR negative.  Respiratory viral  panel negative.  COVID-19 testing negative.  Due to worsening respiratory function and due to concern for COVID-19 patient received a dose of hydroxychloroquine which was subsequently discontinued after tests came back negative.  Case was discussed with ID and by prior hospitalist who recommended discontinuation of airborne and droplet precautions.  Patient on empiric IV antibiotics of cefepime. White count improved and currently at 9.8.  Patient noted to have low-grade temp overnight and CT abdomen and pelvis repeated and negative for abscess formation.  CT abdomen and pelvis shows a stable perinephric hematoma deep to the left kidney.  Bilateral moderate sized pleural effusions with basilar passive atelectasis.  Mild increase in effusion volume.  Patient status post 7-day course of IV cefepime.  Currently afebrile.  No need for any further antibiotics.  Nephrology following.  4.  Iron deficiency anemia Iron level of 12.  TIBC of 249.  Patient receiving IV Nulecit per nephrology.  Status post 1 unit packed red blood cells.  Hemoglobin currently at 7.6 from 7.7 from 7.0 from 7.2 from 8.0 from 8.4 from 6.9. Transfusion threshold hemoglobin less than 7.  Aranesp ordered per nephrology.  Follow H&H.  5.  Metabolic acidosis Secondary to acute kidney injury versus progressive chronic kidney disease.  Patient was on bicarb tablets which have been discontinued.  Per nephrology.   6.  Coronary artery disease status post totally occluded PDA Medically managed prior to admission.  2D echo done with a depressed ejection fraction of 35 to 40% which is worse than prior 2D echo.  Aspirin on hold due to hematoma..  Continue isosorbide, Toprol-XL, hydralazine, Lipitor per cardiology recommendations. Patient underwent Lexiscan Myoview study which was negative.  Outpatient follow-up with cardiology.   7.  Hypertension Blood pressure improved on current regimen of Toprol-XL, hydralazine, Isordil.  Norvasc discontinued.   Medications adjusted per cardiology.   8.  Obesity Outpatient follow-up.  DVT prophylaxis: SCD Code Status: Full Family Communication: Updated patient.  Disposition Plan: To be determined.  And when stable and okay with nephrology.   Consultants:   Nephrology: Dr. Justin Mend 01/07/2019  PCCM Dr. Lynetta Mare 01/09/2019  Interventional radiology: Dr. Anselm Pancoast 01/10/2019  Cardiology: Dr. Meda Coffee 01/11/2019  Procedures:  CT chest 01/06/2019  CT renal stone protocol for 07/18/2019  Chest x-ray 01/06/2019, 01/08/2019, 01/09/2019, 01/10/2019  Upper scopic and ultrasound-guided placement of tunneled dialysis catheter per Dr. Anselm Pancoast 01/10/2019  Renal ultrasound 01/09/2019  2D echo 01/10/2019  Transfuse 1 unit packed red blood cells 01/09/2019  Lexiscan Myoview pending 01/12/2019  CT abdomen and pelvis 01/14/2019  vein mapping.  Antimicrobials:   IV cefepime 01/07/2019 >>>> 01/13/2019  IV vancomycin 01/10/2019 x 1 dose    Subjective: Patient sitting up in bed.  Patient more alert today.  Following commands.  Somewhat drowsy.  Patient is able to remember my name.  Patient with some complaints of nausea.  Patient stated had hemodialysis last night.  Objective: Vitals:   01/17/19 0531 01/17/19 0541 01/17/19 0612 01/17/19 0836  BP: 130/80 (!) 128/101 136/77 (!) 154/99  Pulse: (!) 104 (!) 103 (!) 106 91  Resp:  16    Temp: 99.5 F (37.5 C) 98.9 F (37.2 C) 99.6 F (37.6 C)   TempSrc: Oral Oral Oral   SpO2: 96% 98% 97%   Weight:      Height:  Intake/Output Summary (Last 24 hours) at 01/17/2019 1334 Last data filed at 01/17/2019 0900 Gross per 24 hour  Intake 560 ml  Output 1505 ml  Net -945 ml   Filed Weights   01/16/19 0553 01/16/19 2117 01/17/19 0100  Weight: 86.3 kg 88.5 kg 87 kg    Examination:  General exam: NAD Respiratory system: Lungs clear to auscultation bilaterally.  No wheezes, no crackles, no rhonchi.  Cardiovascular system: RRR no murmurs rubs or gallops.  No  JVD.  No lower extremity edema.  Gastrointestinal system: Abdomen is soft, nontender, nondistended, positive bowel sounds.  No rebound.  No guarding.  Central nervous system: More alert today. No focal neurological deficits. Extremities: Symmetric 5 x 5 power. Skin: No rashes, lesions or ulcers Psychiatry: Judgement and insight appear fair. Mood & affect appropriate.     Data Reviewed: I have personally reviewed following labs and imaging studies  CBC: Recent Labs  Lab 01/11/19 0258 01/12/19 0418 01/13/19 0229 01/13/19 1358 01/14/19 0309 01/14/19 1324 01/15/19 0717 01/16/19 0351  WBC 11.5* 7.6 8.8  --  9.8  --  9.3  --   NEUTROABS 9.1* 5.0 6.6  --   --   --   --   --   HGB 8.4* 8.0* 7.2* 7.1* 7.0* 7.7* 7.7* 7.6*  HCT 25.9* 24.9* 23.1* 22.2* 22.7* 23.7* 25.0* 24.4*  MCV 87.5 88.3 87.5  --  88.0  --  88.7  --   PLT 155 159 158  --  164  --  183  --    Basic Metabolic Panel: Recent Labs  Lab 01/11/19 0258 01/12/19 0418 01/13/19 0229 01/14/19 0309 01/15/19 0332 01/16/19 0351 01/17/19 0258  NA 135  135 138 134* 132* 131* 129* 134*  K 3.6  3.5 4.0 3.6 3.9 3.7 3.8 3.7  CL 102  101 102 99 95* 95* 95* 96*  CO2 20*  22 22 25 27  21* 19* 21*  GLUCOSE 113*  113* 84 84 101* 84 81 78  BUN 44*  44* 24* 12 29* 39* 50* 16  CREATININE 5.31*  5.27* 4.04* 2.73* 4.27* 5.39* 6.20* 2.82*  CALCIUM 8.0*  8.0* 8.0* 7.8* 8.0* 8.2* 8.3* 7.7*  MG 2.0 1.8 2.6*  --   --   --   --   PHOS 5.3* 4.5 2.9 3.1 3.9 4.8* 2.2*   GFR: Estimated Creatinine Clearance: 25.1 mL/min (A) (by C-G formula based on SCr of 2.82 mg/dL (H)). Liver Function Tests: Recent Labs  Lab 01/11/19 0258  01/13/19 0229 01/14/19 0309 01/15/19 0332 01/16/19 0351 01/17/19 0258  AST 15  --   --   --   --   --   --   ALT 9  --   --   --   --   --   --   ALKPHOS 56  --   --   --   --   --   --   BILITOT 1.1  --   --   --   --   --   --   PROT 5.7*  --   --   --   --   --   --   ALBUMIN 2.4*  2.4*   < > 2.1* 2.0*  2.1* 2.3* 2.3*   < > = values in this interval not displayed.   No results for input(s): LIPASE, AMYLASE in the last 168 hours. Recent Labs  Lab 01/16/19 0351  AMMONIA 11   Coagulation Profile: No results  for input(s): INR, PROTIME in the last 168 hours. Cardiac Enzymes: Recent Labs  Lab 01/11/19 0258 01/12/19 0418  CKTOTAL 36* 25*   BNP (last 3 results) No results for input(s): PROBNP in the last 8760 hours. HbA1C: No results for input(s): HGBA1C in the last 72 hours. CBG: Recent Labs  Lab 01/17/19 0135  GLUCAP 72   Lipid Profile: No results for input(s): CHOL, HDL, LDLCALC, TRIG, CHOLHDL, LDLDIRECT in the last 72 hours. Thyroid Function Tests: No results for input(s): TSH, T4TOTAL, FREET4, T3FREE, THYROIDAB in the last 72 hours. Anemia Panel: No results for input(s): VITAMINB12, FOLATE, FERRITIN, TIBC, IRON, RETICCTPCT in the last 72 hours. Sepsis Labs: No results for input(s): PROCALCITON, LATICACIDVEN in the last 168 hours.  Recent Results (from the past 240 hour(s))  MRSA PCR Screening     Status: None   Collection Time: 01/13/19  1:52 AM  Result Value Ref Range Status   MRSA by PCR NEGATIVE NEGATIVE Final    Comment:        The GeneXpert MRSA Assay (FDA approved for NASAL specimens only), is one component of a comprehensive MRSA colonization surveillance program. It is not intended to diagnose MRSA infection nor to guide or monitor treatment for MRSA infections. Performed at [facility], [place] [address], [place],  [address]          Radiology Studies: Vas [place] Upper Ext Vein Mapping (pre-op Avf)  Result Date: 01/17/2019 UPPER EXTREMITY VEIN MAPPING  Indications: Pre-access. Performing Technologist: Toma Copier RVS  Examination Guidelines: A complete evaluation includes B-mode imaging, spectral Doppler, color Doppler, and power Doppler as needed of all accessible portions of each vessel. Bilateral testing is considered an integral  part of a complete examination. Limited examinations for reoccurring indications may be performed as noted. +-----------------+-------------+----------+--------------+ Right Cephalic   Diameter (cm)Depth (cm)   Findings    +-----------------+-------------+----------+--------------+ Prox upper arm       0.58        0.94   Thrombosed0.51 +-----------------+-------------+----------+--------------+ Mid upper arm        0.67        0.78     Thrombosed   +-----------------+-------------+----------+--------------+ Dist upper arm       0.53        0.65     Thrombosed   +-----------------+-------------+----------+--------------+ Antecubital fossa    0.37        0.49     Thrombosed   +-----------------+-------------+----------+--------------+ Prox forearm         0.31        0.86                  +-----------------+-------------+----------+--------------+ Mid forearm          0.33        0.60                  +-----------------+-------------+----------+--------------+ Wrist                0.23        0.49     branching    +-----------------+-------------+----------+--------------+ +-----------------+-------------+----------+-----------------------+ Right Basilic    Diameter (cm)Depth (cm)       Findings         +-----------------+-------------+----------+-----------------------+ Mid upper arm        0.51        1.37                           +-----------------+-------------+----------+-----------------------+  Dist upper arm       0.34        1.24                           +-----------------+-------------+----------+-----------------------+ Antecubital fossa    0.31        0.38                           +-----------------+-------------+----------+-----------------------+ Prox forearm         0.36        0.74                           +-----------------+-------------+----------+-----------------------+ Mid forearm          0.38        1.23                            +-----------------+-------------+----------+-----------------------+ Distal forearm                          not visualized and size +-----------------+-------------+----------+-----------------------+ Wrist                                   not visualized and size +-----------------+-------------+----------+-----------------------+ +-----------------+-------------+----------+-------------------------------+ Left Cephalic    Diameter (cm)Depth (cm)           Findings             +-----------------+-------------+----------+-------------------------------+ Prox upper arm       0.20        1.26                                   +-----------------+-------------+----------+-------------------------------+ Mid upper arm        0.18        0.65                                   +-----------------+-------------+----------+-------------------------------+ Dist upper arm       0.24        0.55                                   +-----------------+-------------+----------+-------------------------------+ Antecubital fossa    0.31        0.50             Thrombosed            +-----------------+-------------+----------+-------------------------------+ Prox forearm         0.34        0.65                                   +-----------------+-------------+----------+-------------------------------+ Mid forearm                             not visualized and IV placement +-----------------+-------------+----------+-------------------------------+ Dist forearm  not visualized and IV placement +-----------------+-------------+----------+-------------------------------+ +-----------------+-------------+----------+-------------------------------+ Left Basilic     Diameter (cm)Depth (cm)           Findings             +-----------------+-------------+----------+-------------------------------+ Mid upper arm        0.35        2.10                                    +-----------------+-------------+----------+-------------------------------+ Dist upper arm       0.29        1.16                                   +-----------------+-------------+----------+-------------------------------+ Antecubital fossa    0.17        0.45                                   +-----------------+-------------+----------+-------------------------------+ Prox forearm                                    not visualized          +-----------------+-------------+----------+-------------------------------+ Mid forearm                             not visualized and IV placement +-----------------+-------------+----------+-------------------------------+ Elbow                                            IV placement           +-----------------+-------------+----------+-------------------------------+ Wrist                                           not visualized          +-----------------+-------------+----------+-------------------------------+ *See table(s) above for measurements and observations.  Diagnosing physician:    Preliminary         Scheduled Meds: . atorvastatin  40 mg Oral q1800  . Chlorhexidine Gluconate Cloth  6 each Topical Q0600  . darbepoetin (ARANESP) injection - DIALYSIS  40 mcg Intravenous Once  . guaiFENesin  1,200 mg Oral BID  . hydrALAZINE  25 mg Oral TID  . isosorbide dinitrate  20 mg Oral BID  . mouth rinse  15 mL Mouth Rinse BID  . metoprolol succinate  50 mg Oral Daily  . sertraline  100 mg Oral Daily  . sodium chloride flush  3 mL Intravenous Q12H  . valACYclovir  1,000 mg Oral BID   Continuous Infusions: . sodium chloride 250 mL (01/11/19 1146)     LOS: 10 days    Time spent: 35 mins    Irine Seal, MD [facility]  If 7PM-7AM, please contact night-coverage [email or web address] 01/17/2019, 1:34 PM

## 2019-01-17 NOTE — Progress Notes (Signed)
Bilateral upper extremity vein mappig completed. Preliminary results in Chart review CV Proc. [facility], [place] 01/17/2019, 11:58 AM

## 2019-01-18 DIAGNOSIS — N151 Renal and perinephric abscess: Secondary | ICD-10-CM

## 2019-01-18 DIAGNOSIS — Z992 Dependence on renal dialysis: Secondary | ICD-10-CM

## 2019-01-18 DIAGNOSIS — D508 Other iron deficiency anemias: Secondary | ICD-10-CM

## 2019-01-18 DIAGNOSIS — N189 Chronic kidney disease, unspecified: Secondary | ICD-10-CM

## 2019-01-18 LAB — CBC WITH DIFFERENTIAL/PLATELET
Abs Immature Granulocytes: 0.03 10*3/uL (ref 0.00–0.07)
Basophils Absolute: 0.1 10*3/uL (ref 0.0–0.1)
Basophils Relative: 1 %
Eosinophils Absolute: 0.2 10*3/uL (ref 0.0–0.5)
Eosinophils Relative: 2 %
HCT: 24.7 % — ABNORMAL LOW (ref 36.0–46.0)
Hemoglobin: 8 g/dL — ABNORMAL LOW (ref 12.0–15.0)
Immature Granulocytes: 0 %
Lymphocytes Relative: 23 %
Lymphs Abs: 1.9 10*3/uL (ref 0.7–4.0)
MCH: 28.8 pg (ref 26.0–34.0)
MCHC: 32.4 g/dL (ref 30.0–36.0)
MCV: 88.8 fL (ref 80.0–100.0)
Monocytes Absolute: 1 10*3/uL (ref 0.1–1.0)
Monocytes Relative: 12 %
Neutro Abs: 5.3 10*3/uL (ref 1.7–7.7)
Neutrophils Relative %: 62 %
Platelets: 187 10*3/uL (ref 150–400)
RBC: 2.78 MIL/uL — ABNORMAL LOW (ref 3.87–5.11)
RDW: 14.8 % (ref 11.5–15.5)
WBC: 8.5 10*3/uL (ref 4.0–10.5)
nRBC: 0.5 % — ABNORMAL HIGH (ref 0.0–0.2)

## 2019-01-18 LAB — GLUCOSE, CAPILLARY: Glucose-Capillary: 82 mg/dL (ref 70–99)

## 2019-01-18 LAB — RENAL FUNCTION PANEL
Albumin: 2.3 g/dL — ABNORMAL LOW (ref 3.5–5.0)
Anion gap: 13 (ref 5–15)
BUN: 21 mg/dL — ABNORMAL HIGH (ref 6–20)
CO2: 20 mmol/L — ABNORMAL LOW (ref 22–32)
Calcium: 8.1 mg/dL — ABNORMAL LOW (ref 8.9–10.3)
Chloride: 100 mmol/L (ref 98–111)
Creatinine, Ser: 4.34 mg/dL — ABNORMAL HIGH (ref 0.44–1.00)
GFR calc Af Amer: 13 mL/min — ABNORMAL LOW (ref >60)
GFR calc non Af Amer: 11 mL/min — ABNORMAL LOW (ref >60)
Glucose, Bld: 69 mg/dL — ABNORMAL LOW (ref 70–99)
Phosphorus: 3.3 mg/dL (ref 2.5–4.6)
Potassium: 3.6 mmol/L (ref 3.5–5.1)
Sodium: 133 mmol/L — ABNORMAL LOW (ref 135–145)

## 2019-01-18 MED ORDER — OXYCODONE HCL 5 MG PO TABS
10.0000 mg | ORAL_TABLET | Freq: Four times a day (QID) | ORAL | Status: DC | PRN
  Filled 2019-01-18: qty 2

## 2019-01-18 MED ORDER — DARBEPOETIN ALFA 40 MCG/0.4ML IJ SOSY
PREFILLED_SYRINGE | INTRAMUSCULAR | Status: AC
  Filled 2019-01-18: qty 0.4

## 2019-01-18 MED ORDER — ALPRAZOLAM 0.25 MG PO TABS: 0.2500 mg | ORAL_TABLET | Freq: Two times a day (BID) | ORAL | Status: DC | PRN

## 2019-01-18 MED ORDER — HEPARIN 1000 UNIT/ML FOR PERITONEAL DIALYSIS
1000.0000 [IU] | INTRAMUSCULAR | Status: DC | PRN
  Administered 2019-01-18: 19:00:00 1000 [IU] via INTRAPERITONEAL
  Administered 2019-01-18: 20:00:00 3800 [IU] via INTRAPERITONEAL
  Administered 2019-01-20: 1000 [IU] via INTRAPERITONEAL
  Filled 2019-01-18 (×2): qty 1

## 2019-01-18 MED ORDER — HEPARIN SODIUM (PORCINE) 1000 UNIT/ML IJ SOLN
INTRAMUSCULAR | Status: AC
  Administered 2019-01-18: 19:00:00 1000 [IU] via INTRAPERITONEAL
  Filled 2019-01-18: qty 4

## 2019-01-18 NOTE — Progress Notes (Signed)
Patient ID: Summer Cook, female   DOB: 1966-06-23, 53 y.o.   MRN: [id number] Summer Cook Progress Note   Assessment/ Plan:   1. 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  There may be a degree of reversibility with regards to the ATN but at this time, will continue expectant monitoring with anticipation that she may need chronic hemodialysis - HD today.  Has been per MWF schedule here this week  - We have started CLIP process to locate outpatient unit.  She is in [place].   - Will need fistula referral as an outpatient  - strict ins/outs to assess for renal recovery -ordered and discussed with patient  - Permanent access planning -vein mapping completed.  She declines vascular consult here for permanent access placement and prefers to go to [facility] where she has been seen before - baseline Cr 1.5 - 2.0 per Epic data  2.  Acute hypoxic respiratory failure: Cleared by pulmonology-low pretest clinical suspicion for COVID-19 with negative testing.  3.  Left extrarenal perinephric hematoma: Status post renal biopsy; hemodynamically stable.  No clear evidence of abscess   4.  Hyperphosphatemia: Secondary to advancing chronic kidney disease and acute kidney injury.  Now with mild hypophos. Discontinued binders and following  5.  Hyponatremia: Secondary to acute kidney injury/advancing chronic kidney disease. Improves with HD  6. Anemia  - Acute blood loss and AKI - stable/improving - on aranesp   7. HTN  -Controlled  Summer Jeans, MD 01/18/2019     Subjective:   She would like to leave today and we discussed that she is not able to go home without a dialysis spot.  With regard to other planning, she reports that she has had a bad experience at [facility] and states that she would not want to undergo surgery here.  She  does understand the need for permanent access and is willing to have a fistula placed, just not here.  She would prefer to go to [facility] for this.   Review of systems;  Reports nausea without vomiting  Denies shortness of breath  No chest pain   Objective:   BP (!) 144/99   Pulse 93   Temp 98.1 F (36.7 C) (Oral)   Resp 18   Ht 5\' 5"  (1.651 m)   Wt 87 kg   LMP 08/13/2011   SpO2 100%   BMI 31.92 kg/m   Intake/Output Summary (Last 24 hours) at 01/18/2019 1358 Last data filed at 01/17/2019 1522 Gross per 24 hour  Intake 400 ml  Output -  Net 400 ml   Weight change:   Physical Exam:  Gen: adult female in bed in no acute distress   CVS:RRR; S1S2; no rubs Resp: clear and unlabored Abd: Soft, flat, nontender Ext: no lower extremity edema Neuro - more conversant today   Imaging: Vas [place] Upper Ext Vein Mapping (pre-op Avf)  Result Date: 01/17/2019 UPPER EXTREMITY VEIN MAPPING  Indications: Pre-access. Performing Technologist: Toma Copier RVS  Examination Guidelines: A complete evaluation includes B-mode imaging, spectral Doppler, color Doppler, and power Doppler as needed of all accessible portions of each vessel. Bilateral testing is considered an integral part of a complete examination. Limited examinations for reoccurring indications may be performed as noted. +-----------------+-------------+----------+--------------+ Right Cephalic   Diameter (cm)Depth (cm)   Findings    +-----------------+-------------+----------+--------------+ Prox upper arm  0.58        0.94   Thrombosed0.51 +-----------------+-------------+----------+--------------+ Mid upper arm        0.67        0.78     Thrombosed   +-----------------+-------------+----------+--------------+ Dist upper arm       0.53        0.65     Thrombosed   +-----------------+-------------+----------+--------------+ Antecubital fossa    0.37        0.49     Thrombosed    +-----------------+-------------+----------+--------------+ Prox forearm         0.31        0.86                  +-----------------+-------------+----------+--------------+ Mid forearm          0.33        0.60                  +-----------------+-------------+----------+--------------+ Wrist                0.23        0.49     branching    +-----------------+-------------+----------+--------------+ +-----------------+-------------+----------+-----------------------+ Right Basilic    Diameter (cm)Depth (cm)       Findings         +-----------------+-------------+----------+-----------------------+ Mid upper arm        0.51        1.37                           +-----------------+-------------+----------+-----------------------+ Dist upper arm       0.34        1.24                           +-----------------+-------------+----------+-----------------------+ Antecubital fossa    0.31        0.38                           +-----------------+-------------+----------+-----------------------+ Prox forearm         0.36        0.74                           +-----------------+-------------+----------+-----------------------+ Mid forearm          0.38        1.23                           +-----------------+-------------+----------+-----------------------+ Distal forearm                          not visualized and size +-----------------+-------------+----------+-----------------------+ Wrist                                   not visualized and size +-----------------+-------------+----------+-----------------------+ +-----------------+-------------+----------+-------------------------------+ Left Cephalic    Diameter (cm)Depth (cm)           Findings             +-----------------+-------------+----------+-------------------------------+ Prox upper arm       0.20        1.26                                    +-----------------+-------------+----------+-------------------------------+  Mid upper arm        0.18        0.65                                   +-----------------+-------------+----------+-------------------------------+ Dist upper arm       0.24        0.55                                   +-----------------+-------------+----------+-------------------------------+ Antecubital fossa    0.31        0.50             Thrombosed            +-----------------+-------------+----------+-------------------------------+ Prox forearm         0.34        0.65                                   +-----------------+-------------+----------+-------------------------------+ Mid forearm                             not visualized and IV placement +-----------------+-------------+----------+-------------------------------+ Dist forearm                            not visualized and IV placement +-----------------+-------------+----------+-------------------------------+ +-----------------+-------------+----------+-------------------------------+ Left Basilic     Diameter (cm)Depth (cm)           Findings             +-----------------+-------------+----------+-------------------------------+ Mid upper arm        0.35        2.10                                   +-----------------+-------------+----------+-------------------------------+ Dist upper arm       0.29        1.16                                   +-----------------+-------------+----------+-------------------------------+ Antecubital fossa    0.17        0.45                                   +-----------------+-------------+----------+-------------------------------+ Prox forearm                                    not visualized          +-----------------+-------------+----------+-------------------------------+ Mid forearm                             not visualized and IV placement  +-----------------+-------------+----------+-------------------------------+ Elbow                                            IV placement           +-----------------+-------------+----------+-------------------------------+  Wrist                                           not visualized          +-----------------+-------------+----------+-------------------------------+ *See table(s) above for measurements and observations.  Diagnosing physician:    Preliminary      Labs: BMET Recent Labs  Lab 01/12/19 0418 01/13/19 0229 01/14/19 0309 01/15/19 0332 01/16/19 0351 01/17/19 0258 01/18/19 0305  NA 138 134* 132* 131* 129* 134* 133*  K 4.0 3.6 3.9 3.7 3.8 3.7 3.6  CL 102 99 95* 95* 95* 96* 100  CO2 22 25 27  21* 19* 21* 20*  GLUCOSE 84 84 101* 84 81 78 69*  BUN 24* 12 29* 39* 50* 16 21*  CREATININE 4.04* 2.73* 4.27* 5.39* 6.20* 2.82* 4.34*  CALCIUM 8.0* 7.8* 8.0* 8.2* 8.3* 7.7* 8.1*  PHOS 4.5 2.9 3.1 3.9 4.8* 2.2* 3.3   CBC Recent Labs  Lab 01/12/19 0418 01/13/19 0229  01/14/19 0309 01/14/19 1324 01/15/19 0717 01/16/19 0351 01/18/19 0305  WBC 7.6 8.8  --  9.8  --  9.3  --  8.5  NEUTROABS 5.0 6.6  --   --   --   --   --  5.3  HGB 8.0* 7.2*   < > 7.0* 7.7* 7.7* 7.6* 8.0*  HCT 24.9* 23.1*   < > 22.7* 23.7* 25.0* 24.4* 24.7*  MCV 88.3 87.5  --  88.0  --  88.7  --  88.8  PLT 159 158  --  164  --  183  --  187   < > = values in this interval not displayed.    Medications:    . atorvastatin  40 mg Oral q1800  . Chlorhexidine Gluconate Cloth  6 each Topical Q0600  . darbepoetin (ARANESP) injection - DIALYSIS  40 mcg Intravenous Q Wed-HD  . guaiFENesin  1,200 mg Oral BID  . hydrALAZINE  25 mg Oral TID  . isosorbide dinitrate  20 mg Oral BID  . mouth rinse  15 mL Mouth Rinse BID  . metoprolol succinate  50 mg Oral Daily  . sertraline  100 mg Oral Daily  . sodium chloride flush  3 mL Intravenous Q12H  . valACYclovir  1,000 mg Oral BID

## 2019-01-18 NOTE — Progress Notes (Signed)
This nurse was called to the patient room where the patient was found fully dressed putting on her shoes.  Patient was yelling "I'm not safe here and if I stay I going to die".   Attempts were made to deescalate the situation but the patient continue to threaten to leave the hospital and remove her HD cath on her own .  Security, her daughter and the NP was called to assist with calming the patient down.  The patient finally agreed to stay tonight but stated she will be leaving in the AM.   Patient is now compliant with staff. Will continue to monitor the patient and notify as needed

## 2019-01-18 NOTE — Telephone Encounter (Signed)
01/18/19-currently hospitalized/al

## 2019-01-18 NOTE — Progress Notes (Signed)
Dialysis Coordinator received call from Dr. Bartholomew Boards to initiate OP HD seat referral process. Dialysis Coordinator spoke with patient and she would like to treat at [facility]. Information submitted to [facility] Admissions Coordinator. Dialysis Coordinator will update MD and patient once OP HD seat is secured and schedule has been obtained.   Alphonzo Cruise Dialysis Coordinator [phone]

## 2019-01-18 NOTE — Progress Notes (Signed)
PROGRESS NOTE    Summer Cook  ZOX:[id number] DOB: 10/28/1965 DOA: 01/06/2019 PCP: Redmond School, MD    Brief Narrative:  53 year old female with history of hypertension, CAD, GERD, ITP status post splenectomy, PE in 2013, chronic kidney disease stage III and recent AKI on CKD who underwent kidney biopsy on 01/03/2019 with subsequent left flank pain since discharge associated with fever and cough.  In the ED, CT scan of the chest abdomen and pelvis showed cardiomegaly with bilateral pleural effusions and findings consistent with pulmonary edema along with patchy bilateral groundglass opacities in both lungs.  CT scan of the abdomen and pelvis showed a recent left renal biopsy with moderate sized left extrarenal perinephric hematoma with a small foci of air within the hematoma raising question of superimposed infection.  She was admitted for the same.  Nephrology was consulted.  During the hospitalization, her respiratory status worsened.  PCCM was also consulted.  COVID-19 was tested which turned out negative.  Initially she was kept on isolation but after discussion with ID, droplet and contact precautions were discontinued.   Assessment & Plan:   Principal Problem:   Acute renal failure superimposed on stage 3 chronic kidney disease (HCC) Active Problems:   Essential hypertension   Acute respiratory failure with hypoxia (HCC)   Stage 3 chronic kidney disease (HCC)   Diabetes mellitus type 2, uncomplicated (HCC)   CAD (coronary artery disease): total distal RCA EF normal (05/20/17)   Fever   CKD (chronic kidney disease), stage V (HCC)   Cough   Renal hematoma, left   Acute on chronic systolic congestive heart failure (HCC)   Iron deficiency anemia   1 acute renal failure on chronic kidney disease stage III versus progressive chronic kidney disease -Patient noted to go into worsening acute kidney injury and nonoliguric seen by nephrology and subsequently started on hemodialysis due  to worsening renal function and worsening volume overload. 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 -nephrology service anticipating need for permanent HD; currently waiting completion of clipping process prior to discharge. -outpatient follow up for permanent access. -last HD treatment 01/16/19 and anticipated treatment again today 01/18/19 -follow nephrology rec's.  2.  Acute hypoxic respiratory failure secondary to acute systolic heart failure likely secondary to acute renal failure and fluid overload. -Patient noted to have a worsening acute hypoxic respiratory failure felt likely secondary to volume overload secondary to acute renal failure.   -Patient was seen by pulmonary and felt to have a low pretest clinical suspicion for COVID-19, test was done and negative.   -Patient with clinical improvement after being started on hemodialysis.   -2D echo with a EF of 35 to 40% with left ventricular diffuse hypokinesis.  -Patient noted to have a normal EF on 05/21/2017. Due to depressed ejection fraction cardiology has been consulted.  Patient underwent a Lexiscan Myoview study which was negative and medication has been adjusted for HF, in setting of ARF>. -appreciate cardiology rec's.  -continue volume management with HD.  3.  Fever /left extrarenal perinephric hematoma -Status post recent renal biopsy.  Patient hemodynamically stable.  CT abdomen and pelvis which was done was concerning for perinephric hematoma with question for superimposed infection.   -Patient currently afebrile.  Influenza PCR negative.  Respiratory viral panel negative.  COVID-19 testing negative.   -Due to worsening respiratory function and due to concern for COVID-19 patient received a dose of  hydroxychloroquine which was subsequently discontinued after tests came back negative.   -Case was discussed with ID  by prior hospitalist who recommended discontinuation of airborne and droplet precautions.   -Patient on completed 7 days of empiric IV cefepime. Patient afebrile and with normal WBC"s. -No need for any further antibiotics appreciated.   -Nephrology following.  4.  Iron deficiency anemia -Iron level of 12.  TIBC of 249.  Patient receiving IV Nulecit as per nephrology discretion.   -Status post 1 unit packed red blood cells.   -Hemoglobin currently 8.0 -Transfusion threshold hemoglobin less than 7.   -Aranesp ordered per nephrology.   -Follow Hemoglobin trend.  5.  Metabolic acidosis -Secondary to acute kidney injury versus progressive chronic kidney disease. -now regulated with HD, CO2 20 -follow nephrology recommendations  6.  Coronary artery disease status post totally occluded PDA -Medically managed prior to admission.   -2D echo done with a depressed ejection fraction of 35 to 40% which is worse than prior 2D echo.   -Aspirin on hold due to renal hematoma. -Continue isosorbide, Toprol-XL, hydralazine, Lipitor per cardiology recommendations.  -Patient underwent Lexiscan Myoview study which was negative.   -Outpatient follow-up with cardiology.   7.  Hypertension -Blood pressure improved and well controlled -continue current regimen of Toprol-XL, hydralazine, Isordil.   -Norvasc discontinued in the setting on low LV function.   -Medications adjusted per cardiology service..   8.  Class 1 Obesity -Body mass index is 31.92 kg/m. -low calorie diet, portion control and physical activity discussed with patient.   DVT prophylaxis: SCD Code Status: Full Family Communication: Updated patient.  Disposition Plan: home at discharge, once clipping process completed and outpatient HD set up. Follow renal service recommendations.   Consultants:   Nephrology: Dr. Justin Mend 01/07/2019  PCCM Dr. Lynetta Mare 01/09/2019  Interventional radiology: Dr. Anselm Pancoast 01/10/2019  Cardiology: Dr. Meda Coffee  01/11/2019  Procedures:  CT chest 01/06/2019  CT renal stone protocol for 07/18/2019  Chest x-ray 01/06/2019, 01/08/2019, 01/09/2019, 01/10/2019  Upper scopic and ultrasound-guided placement of tunneled dialysis catheter per Dr. Anselm Pancoast 01/10/2019  Renal ultrasound 01/09/2019  2D echo 01/10/2019  Transfuse 1 unit packed red blood cells 01/09/2019  Lexiscan Myoview pending 01/12/2019  CT abdomen and pelvis 01/14/2019  vein mapping.  Antimicrobials:   IV cefepime 01/07/2019 >>>> 01/13/2019  IV vancomycin 01/10/2019 x 1 dose   Subjective: Patient is alert, awake and oriented x3; able to follow commands and answer questions appropriately.  No chest pain, no shortness of breath, no vomiting. Renal function panel demonstrated need for continue intermittent HD. No hallucinations, no SI. Patient reported making some urine.  Objective: Vitals:   01/17/19 1504 01/17/19 2035 01/18/19 0433 01/18/19 0823  BP: (!) 123/91 128/89 128/80 (!) 144/99  Pulse: (!) 101 96 94 93  Resp: 18 18 18    Temp: 99.2 F (37.3 C) 99 F (37.2 C) 98.1 F (36.7 C)   TempSrc: Oral Oral Oral   SpO2: 94% 96% 95% 100%  Weight:      Height:        Intake/Output Summary (Last 24 hours) at 01/18/2019 1518 Last data filed at 01/17/2019 1522 Gross per 24 hour  Intake 400 ml  Output -  Net 400 ml   Filed Weights   01/16/19 0553 01/16/19 2117 01/17/19 0100  Weight: 86.3 kg 88.5 kg 87 kg    Examination: General exam: Alert, awake, oriented x 3, afebrile, no chest pain, no shortness of breath currently.  Patient reports some intermittent nausea  and also experiencing some diarrhea.  Respiratory system: Clear to auscultation. Respiratory effort normal.  Good oxygen saturation on room air. Cardiovascular system:RRR. No murmurs, rubs, gallops.  No JVD appreciated on exam. Gastrointestinal system: Abdomen is nondistended, soft and nontender. No organomegaly or masses felt. Normal bowel sounds heard. Central nervous system:  Alert and oriented. No focal neurological deficits. Extremities: No cyanosis or clubbing, normal muscle tone. Skin: No rashes, no petechiae. Psychiatry: Judgement and insight appear normal. Mood & affect appropriate.   Data Reviewed: I have personally reviewed following labs and imaging studies  CBC: Recent Labs  Lab 01/12/19 0418 01/13/19 0229  01/14/19 0309 01/14/19 1324 01/15/19 0717 01/16/19 0351 01/18/19 0305  WBC 7.6 8.8  --  9.8  --  9.3  --  8.5  NEUTROABS 5.0 6.6  --   --   --   --   --  5.3  HGB 8.0* 7.2*   < > 7.0* 7.7* 7.7* 7.6* 8.0*  HCT 24.9* 23.1*   < > 22.7* 23.7* 25.0* 24.4* 24.7*  MCV 88.3 87.5  --  88.0  --  88.7  --  88.8  PLT 159 158  --  164  --  183  --  187   < > = values in this interval not displayed.   Basic Metabolic Panel: Recent Labs  Lab 01/12/19 0418 01/13/19 0229 01/14/19 0309 01/15/19 0332 01/16/19 0351 01/17/19 0258 01/18/19 0305  NA 138 134* 132* 131* 129* 134* 133*  K 4.0 3.6 3.9 3.7 3.8 3.7 3.6  CL 102 99 95* 95* 95* 96* 100  CO2 22 25 27  21* 19* 21* 20*  GLUCOSE 84 84 101* 84 81 78 69*  BUN 24* 12 29* 39* 50* 16 21*  CREATININE 4.04* 2.73* 4.27* 5.39* 6.20* 2.82* 4.34*  CALCIUM 8.0* 7.8* 8.0* 8.2* 8.3* 7.7* 8.1*  MG 1.8 2.6*  --   --   --   --   --   PHOS 4.5 2.9 3.1 3.9 4.8* 2.2* 3.3   GFR: Estimated Creatinine Clearance: 16.3 mL/min (A) (by C-G formula based on SCr of 4.34 mg/dL (H)).   Liver Function Tests: Recent Labs  Lab 01/14/19 0309 01/15/19 0332 01/16/19 0351 01/17/19 0258 01/18/19 0305  ALBUMIN 2.0* 2.1* 2.3* 2.3* 2.3*   No results for input(s): LIPASE, AMYLASE in the last 168 hours.   Recent Labs  Lab 01/16/19 0351  AMMONIA 11   Cardiac Enzymes: Recent Labs  Lab 01/12/19 0418  CKTOTAL 25*   CBG: Recent Labs  Lab 01/17/19 0135  GLUCAP 72    Recent Results (from the past 240 hour(s))  MRSA PCR Screening     Status: None   Collection Time: 01/13/19  1:52 AM  Result Value Ref Range Status    MRSA by PCR NEGATIVE NEGATIVE Final    Comment:        The GeneXpert MRSA Assay (FDA approved for NASAL specimens only), is one component of a comprehensive MRSA colonization surveillance program. It is not intended to diagnose MRSA infection nor to guide or monitor treatment for MRSA infections. Performed at [facility], [place] [address] [place],  [address]     Radiology Studies: Vas [place] Upper Ext Vein Mapping (pre-op Avf)  Result Date: 01/17/2019 UPPER EXTREMITY VEIN MAPPING  Indications: Pre-access. Performing Technologist: Toma Copier RVS  Examination Guidelines: A complete evaluation includes B-mode imaging, spectral Doppler, color Doppler, and power Doppler as needed of all accessible portions of each vessel. Bilateral  testing is considered an integral part of a complete examination. Limited examinations for reoccurring indications may be performed as noted. +-----------------+-------------+----------+--------------+ Right Cephalic   Diameter (cm)Depth (cm)   Findings    +-----------------+-------------+----------+--------------+ Prox upper arm       0.58        0.94   Thrombosed0.51 +-----------------+-------------+----------+--------------+ Mid upper arm        0.67        0.78     Thrombosed   +-----------------+-------------+----------+--------------+ Dist upper arm       0.53        0.65     Thrombosed   +-----------------+-------------+----------+--------------+ Antecubital fossa    0.37        0.49     Thrombosed   +-----------------+-------------+----------+--------------+ Prox forearm         0.31        0.86                  +-----------------+-------------+----------+--------------+ Mid forearm          0.33        0.60                  +-----------------+-------------+----------+--------------+ Wrist                0.23        0.49     branching    +-----------------+-------------+----------+--------------+  +-----------------+-------------+----------+-----------------------+ Right Basilic    Diameter (cm)Depth (cm)       Findings         +-----------------+-------------+----------+-----------------------+ Mid upper arm        0.51        1.37                           +-----------------+-------------+----------+-----------------------+ Dist upper arm       0.34        1.24                           +-----------------+-------------+----------+-----------------------+ Antecubital fossa    0.31        0.38                           +-----------------+-------------+----------+-----------------------+ Prox forearm         0.36        0.74                           +-----------------+-------------+----------+-----------------------+ Mid forearm          0.38        1.23                           +-----------------+-------------+----------+-----------------------+ Distal forearm                          not visualized and size +-----------------+-------------+----------+-----------------------+ Wrist                                   not visualized and size +-----------------+-------------+----------+-----------------------+ +-----------------+-------------+----------+-------------------------------+ Left Cephalic    Diameter (cm)Depth (cm)           Findings             +-----------------+-------------+----------+-------------------------------+ Prox upper arm  0.20        1.26                                   +-----------------+-------------+----------+-------------------------------+ Mid upper arm        0.18        0.65                                   +-----------------+-------------+----------+-------------------------------+ Dist upper arm       0.24        0.55                                   +-----------------+-------------+----------+-------------------------------+ Antecubital fossa    0.31        0.50             Thrombosed             +-----------------+-------------+----------+-------------------------------+ Prox forearm         0.34        0.65                                   +-----------------+-------------+----------+-------------------------------+ Mid forearm                             not visualized and IV placement +-----------------+-------------+----------+-------------------------------+ Dist forearm                            not visualized and IV placement +-----------------+-------------+----------+-------------------------------+ +-----------------+-------------+----------+-------------------------------+ Left Basilic     Diameter (cm)Depth (cm)           Findings             +-----------------+-------------+----------+-------------------------------+ Mid upper arm        0.35        2.10                                   +-----------------+-------------+----------+-------------------------------+ Dist upper arm       0.29        1.16                                   +-----------------+-------------+----------+-------------------------------+ Antecubital fossa    0.17        0.45                                   +-----------------+-------------+----------+-------------------------------+ Prox forearm                                    not visualized          +-----------------+-------------+----------+-------------------------------+ Mid forearm                             not visualized and IV placement +-----------------+-------------+----------+-------------------------------+ Elbow  IV placement           +-----------------+-------------+----------+-------------------------------+ Wrist                                           not visualized          +-----------------+-------------+----------+-------------------------------+ *See table(s) above for measurements and observations.  Diagnosing physician:    Preliminary     Scheduled Meds: . atorvastatin  40 mg Oral q1800  . Chlorhexidine Gluconate Cloth  6 each Topical Q0600  . darbepoetin (ARANESP) injection - DIALYSIS  40 mcg Intravenous Q Wed-HD  . guaiFENesin  1,200 mg Oral BID  . hydrALAZINE  25 mg Oral TID  . isosorbide dinitrate  20 mg Oral BID  . mouth rinse  15 mL Mouth Rinse BID  . metoprolol succinate  50 mg Oral Daily  . sertraline  100 mg Oral Daily  . sodium chloride flush  3 mL Intravenous Q12H  . valACYclovir  1,000 mg Oral BID   Continuous Infusions: . sodium chloride 250 mL (01/11/19 1146)     LOS: 11 days    Time spent: 30 mins    Barton Dubois, MD [facility] [phone]  01/18/2019, 3:18 PM

## 2019-01-19 DIAGNOSIS — N186 End stage renal disease: Secondary | ICD-10-CM

## 2019-01-19 DIAGNOSIS — Z992 Dependence on renal dialysis: Secondary | ICD-10-CM

## 2019-01-19 LAB — RENAL FUNCTION PANEL
Albumin: 2.5 g/dL — ABNORMAL LOW (ref 3.5–5.0)
Anion gap: 13 (ref 5–15)
BUN: 8 mg/dL (ref 6–20)
CO2: 24 mmol/L (ref 22–32)
Calcium: 8.3 mg/dL — ABNORMAL LOW (ref 8.9–10.3)
Chloride: 100 mmol/L (ref 98–111)
Creatinine, Ser: 2.89 mg/dL — ABNORMAL HIGH (ref 0.44–1.00)
GFR calc Af Amer: 21 mL/min — ABNORMAL LOW (ref >60)
GFR calc non Af Amer: 18 mL/min — ABNORMAL LOW (ref >60)
Glucose, Bld: 74 mg/dL (ref 70–99)
Phosphorus: 2.3 mg/dL — ABNORMAL LOW (ref 2.5–4.6)
Potassium: 4 mmol/L (ref 3.5–5.1)
Sodium: 137 mmol/L (ref 135–145)

## 2019-01-19 MED ORDER — HYDRALAZINE HCL 50 MG PO TABS: 50.0000 mg | ORAL_TABLET | Freq: Three times a day (TID) | ORAL | Status: DC

## 2019-01-19 MED ORDER — CHLORHEXIDINE GLUCONATE CLOTH 2 % EX PADS
6.0000 | MEDICATED_PAD | Freq: Every day | CUTANEOUS | Status: DC
  Administered 2019-01-20: 6 via TOPICAL

## 2019-01-19 MED ORDER — HYDRALAZINE HCL 25 MG PO TABS
25.0000 mg | ORAL_TABLET | Freq: Three times a day (TID) | ORAL | Status: DC
  Administered 2019-01-19 – 2019-01-20 (×3): 25 mg via ORAL
  Filled 2019-01-19 (×3): qty 1

## 2019-01-19 NOTE — Progress Notes (Signed)
   01/19/19 1600  Clinical Encounter Type  Visited With Patient  Visit Type Initial;Spiritual support  Referral From Nurse  Consult/Referral To Chaplain  Spiritual Encounters  Spiritual Needs Emotional;Prayer  Stress Factors  Patient Stress Factors Major life changes;Health changes;Exhausted   Responded to spiritual care consult. Pt was sitting up on her seat. Pt stated she was very irritated at her staff and voiced it to her Nurse. She said that she was told she was going home today and that various persons of the care team have been telling her different stories. I offered spiritual care with empathic listening, ministry of presence, and prayer. She stated that she feels like she knows what to do when she gets out. She wants to go home to her daughter, and Elenor Legato. Also, she wants to go back to [place] and misses being with a community of believers. She hasn't been to [facility] since her Doristine Bosworth passed away years back. She stated that she was very thankful for the Chaplain presence and to be heard.

## 2019-01-19 NOTE — Progress Notes (Signed)
Patient ID: Summer Cook, female   DOB: 03-Nov-1965, 53 y.o.   MRN: [id number] [facility] Progress Note   Assessment/ Plan:   1. 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  There may be a degree of reversibility with regards to the ATN but at this time, will continue expectant monitoring with anticipation that she would likely need chronic hemodialysis - HD is MWF schedule for now while inpatient.  She does not yet have an outpatient dialysis spot - We have started CLIP process to locate outpatient unit.  She is in [place].   - Will need fistula referral as an outpatient.  She declines vascular consult here for permanent access placement and prefers to go to [facility] where she has been seen before - strict ins/outs - baseline Cr 1.5 - 2.0 per Epic data  2.  Acute hypoxic respiratory failure: Cleared by pulmonology-low pretest clinical suspicion for COVID-19 with negative testing.  3.  Left extrarenal perinephric hematoma: Status post renal biopsy; hemodynamically stable.  No clear evidence of abscess   4.  Hyperphosphatemia: Secondary to advancing chronic kidney disease and acute kidney injury.  Now with mild hypophos. Off of binders and following.  Confirmed regular diet is ordered  5.  Hyponatremia: Secondary to acute kidney injury/advancing chronic kidney disease. Improves with HD  6. Anemia  - Acute blood loss and AKI - stable/improving on last eval  - on aranesp   7. HTN  - follow on current regimen for now  [name], MD 01/19/2019     Subjective:   She underwent HD yesterday with 1.5 kg off.  Patient had 600 mL UOP over 4/22.  She is frustrated because she was told that she could go home today.  We discussed that she did not yet have an outpatient unit but that our team is working hard on that.  Spoke with our dialysis  coordinator via phone with the patient in the room and confirmed still waiting.   Review of systems;  Reports nausea without vomiting  Denies shortness of breath  No chest pain   Objective:   BP (!) 140/96 (BP Location: Left Arm)   Pulse 91   Temp 98.1 F (36.7 C) (Oral)   Resp 14   Ht 5\' 5"  (1.651 m)   Wt 83.4 kg   LMP 08/13/2011   SpO2 97%   BMI 30.60 kg/m   Intake/Output Summary (Last 24 hours) at 01/19/2019 1240 Last data filed at 01/19/2019 1829 Gross per 24 hour  Intake 3 ml  Output 2100 ml  Net -2097 ml   Weight change:   Physical Exam:  Gen: adult female in bed in no acute distress   CVS:RRR; S1S2; no rub Resp: clear and unlabored Abd: Soft, flat, nontender Ext: no lower extremity edema Neuro - more conversant today Access Tunneled dialysis catheter   Imaging: No results found.   Labs: BMET Recent Labs  Lab 01/13/19 0229 01/14/19 0309 01/15/19 0332 01/16/19 0351 01/17/19 0258 01/18/19 0305 01/19/19 0304  NA 134* 132* 131* 129* 134* 133* 137  K 3.6 3.9 3.7 3.8 3.7 3.6 4.0  CL 99 95* 95* 95* 96* 100 100  CO2 25 27 21* 19* 21* 20* 24  GLUCOSE 84 101* 84 81 78 69* 74  BUN 12 29* 39* 50* 16 21* 8  CREATININE 2.73* 4.27* 5.39* 6.20* 2.82* 4.34* 2.89*  CALCIUM 7.8* 8.0* 8.2* 8.3* 7.7* 8.1* 8.3*  PHOS 2.9 3.1 3.9 4.8* 2.2* 3.3 2.3*   CBC Recent Labs  Lab 01/13/19 0229  01/14/19 0309 01/14/19 1324 01/15/19 0717 01/16/19 0351 01/18/19 0305  WBC 8.8  --  9.8  --  9.3  --  8.5  NEUTROABS 6.6  --   --   --   --   --  5.3  HGB 7.2*   < > 7.0* 7.7* 7.7* 7.6* 8.0*  HCT 23.1*   < > 22.7* 23.7* 25.0* 24.4* 24.7*  MCV 87.5  --  88.0  --  88.7  --  88.8  PLT 158  --  164  --  183  --  187   < > = values in this interval not displayed.    Medications:    . atorvastatin  40 mg Oral q1800  . Chlorhexidine Gluconate Cloth  6 each Topical Q0600  . guaiFENesin  1,200 mg Oral BID  . hydrALAZINE  25 mg Oral TID  . isosorbide dinitrate  20 mg Oral BID   . mouth rinse  15 mL Mouth Rinse BID  . metoprolol succinate  50 mg Oral Daily  . sertraline  100 mg Oral Daily  . sodium chloride flush  3 mL Intravenous Q12H  . valACYclovir  1,000 mg Oral BID

## 2019-01-19 NOTE — Progress Notes (Signed)
Dialysis Coordinator has contacted [facility] Admissions Coordinator for update on OP HD clinic acceptance. No schedule available yet. Dialysis Coordinator will update patient and MD as soon as seat schedule is obtained.   Alphonzo Cruise Dialysis Coordinator [phone]

## 2019-01-19 NOTE — Consult Note (Addendum)
Hospital Consult    Reason for Consult:  Permanent dialysis access Requesting Physician:  Dr. Royce Macadamia MRN #:  [id number]  History of Present Illness: This is a 53 y.o. female who was admitted to the hospital 01/07/19 for SOB and COVID rule out which came back negative.  She is now ESRD and on HD on MWF schedule.  IR placed R IJ TDC during admission on 01/10/19.  Vascular is asked to evaluate for permanent dialysis access.  She is L arm dominant however writes with her R hand.  She has no extremity preference for dialysis access.  She is not taking blood thinners.  She does not have a pacemaker.  Vein mapping was performed yesterday and demonstrates thrombosed cephalic veins bilaterally.  R basilic vein appears adequate for conduit use.  L basilic vein appears marginal for conduit use.  It should also be noted that the patient is very frustrated during exam because she has had a lengthy admission and had plans for discharge home today.  Past Medical History:  Diagnosis Date  . Allergy   . Anxiety   . Asthma   . CAD (coronary artery disease), native coronary artery    a. cath 05/20/17 - 100% distal PDA --> medical mangement (no intervention done)  . Candida esophagitis ([place])   . Chest pain at rest 04/2017  . Depression   . Essential hypertension, benign   . GERD (gastroesophageal reflux disease)   . Gout   . Hemorrhoids   . 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  Last was Prednisone '40mg'$ ; 2 wk in 10/2012.  She also was on Prednisone bridged with Cellcept briefly but stopped due to lack of insurance.   . Myocardial infarction ([place])   . Pulmonary embolism ([place]) 04/2012  . Renal insufficiency   . Serrated adenoma of colon   . Steroid-induced diabetes ([place])   . Stroke ([place])   . Thrush, oral 05/29/2011    Past Surgical History:  Procedure Laterality Date  . BONE MARROW BIOPSY    .  CARDIAC CATHETERIZATION    . CARPAL TUNNEL RELEASE    . CARPAL TUNNEL RELEASE Left 05/20/2016   Procedure: CARPAL TUNNEL RELEASE;  Surgeon: Carole Civil, MD;  Location: [facility];  Service: Orthopedics;  Laterality: Left;  . CARPAL TUNNEL RELEASE Right 07/16/2016   Procedure: RIGHT CARPAL TUNNEL RELEASE;  Surgeon: Carole Civil, MD;  Location: [facility];  Service: Orthopedics;  Laterality: Right;  . CATARACT EXTRACTION    . CHOLECYSTECTOMY  2008  . COLONOSCOPY WITH ESOPHAGOGASTRODUODENOSCOPY (EGD) N/A 01/04/2013   Dr. Gala Romney: esophageal plaques with +KOH, hh. Gastric antrum abnormal, bx reactive gastropathy. Anal canal hemorrhoids, colonic tics, normal TI, single polyp (sessile serrated tubular adenoma). Next TCS 12/2017  . IR FLUORO GUIDE CV LINE RIGHT  01/10/2019  . IR US GUIDE VASC ACCESS RIGHT  01/10/2019  . LEFT HEART CATH AND CORONARY ANGIOGRAPHY N/A 05/20/2017   Procedure: LEFT HEART CATH AND CORONARY ANGIOGRAPHY;  Surgeon: Martinique, Peter M, MD;  Location: [facility];  Service: Cardiovascular;  Laterality: N/A;  . 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 TEE WITHOUT CARDIOVERSION N/A 01/03/2016   Procedure: TRANSESOPHAGEAL ECHOCARDIOGRAM (TEE) WITH PROPOFOL;  Surgeon: Satira Sark, MD;  Location: [facility];  Service: Cardiovascular;  Laterality: N/A;    Allergies  Allergen Reactions  . [place] Venom Swelling    Swelling at injection site  .  Dilaudid [Hydromorphone Hcl] Anxiety and Other (See Comments)    Causes hallucinations, anxiety, and panic per patient  . Adhesive [Tape] Rash    Please use paper tape  . Brintellix [Vortioxetine] Itching  . Cymbalta [Duloxetine Hcl] Itching  . Penicillins Itching    Did it involve swelling of the face/tongue/throat, SOB, or low BP? No Did it involve sudden or severe rash/hives, skin peeling, or any reaction on the inside of your mouth or nose? No Did you need to seek medical attention at a hospital or doctor's office? No When did it  last happen?53 yrs old If all above answers are "NO", may proceed with cephalosporin use.    Prior to Admission medications   Medication Sig Start Date End Date Taking? Authorizing Provider  acetaminophen (TYLENOL) 500 MG tablet Take 1,000-1,500 mg by mouth every 6 (six) hours as needed for headache (pain).   Yes [provider]  albuterol (PROVENTIL HFA;VENTOLIN HFA) 108 (90 Base) MCG/ACT inhaler Inhale 2 puffs into the lungs every 6 (six) hours as needed for wheezing or shortness of breath.   Yes [provider]  amLODipine (NORVASC) 10 MG tablet Take 1 tablet (10 mg total) by mouth daily for 30 days. 01/05/19 02/04/19 Yes Shah, Pratik D, DO  aspirin EC 81 MG EC tablet Take 1 tablet (81 mg total) by mouth daily. 05/23/17  Yes Eileen Stanford, PA-C  diclofenac sodium (VOLTAREN) 1 % GEL Apply 2 g topically 4 (four) times daily. Patient taking differently: Apply 2 g topically 4 (four) times daily as needed (pain).  06/06/17  Yes Rizwan, Eunice Blase, MD  ipratropium-albuterol (DUONEB) 0.5-2.5 (3) MG/3ML SOLN Take 3 mLs by nebulization 3 (three) times daily as needed (shorntess of breath/wheezing). Patient taking differently: Take 3 mLs by nebulization 3 (three) times daily as needed (shortness of breath/wheezing).  02/18/15  Yes Kathie Dike, MD  isosorbide dinitrate (ISORDIL) 10 MG tablet Take 1 tablet (10 mg total) by mouth 2 (two) times daily for 30 days. 01/04/19 02/03/19 Yes Shah, Pratik D, DO  metoprolol tartrate (LOPRESSOR) 25 MG tablet Take 1 tablet (25 mg total) by mouth 2 (two) times daily for 30 days. 01/04/19 02/03/19 Yes Shah, Pratik D, DO  nitroGLYCERIN (NITROSTAT) 0.4 MG SL tablet Place 1 tablet (0.4 mg total) under the tongue every 5 (five) minutes x 3 doses as needed for chest pain. 05/25/17  Yes Carlota Raspberry, Tiffany, PA-C  sertraline (ZOLOFT) 100 MG tablet Take 100 mg by mouth daily.  10/11/18  Yes [provider]  sodium bicarbonate 650 MG tablet Take 1 tablet (650 mg  total) by mouth 2 (two) times daily for 30 days. 01/04/19 02/03/19 Yes Shah, Pratik D, DO  venlafaxine (EFFEXOR) 37.5 MG tablet Take 37.5 mg by mouth 2 (two) times daily. 12/22/18  Yes [provider]  prochlorperazine (COMPAZINE) 10 MG tablet Take 10 mg by mouth as needed. For nausea   12/18/11  [provider]    Social History   Socioeconomic History  . Marital status: Divorced    Spouse name: Not on file  . Number of children: 1  . Years of education: Not on file  . 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   Social Needs  . Financial resource strain: Not hard at all  . Food insecurity:    Worry: Sometimes true    Inability: Sometimes true  . Transportation needs:  Medical: No    Non-medical: No  Tobacco Use  . Smoking status: Former Smoker    Years: 0.00    Types: Cigarettes    Last attempt to quit: 02/14/2009    Years since quitting: 9.9  . Smokeless tobacco: Never Used  Substance and Sexual Activity  . Alcohol use: No    Alcohol/week: 0.0 standard drinks  . Drug use: No  . Sexual activity: Never  Lifestyle  . Physical activity:    Days per week: 2 days    Minutes per session: 30 min  . Stress: Rather much  Relationships  . Social connections:    Talks on phone: More than three times a week    Gets together: More than three times a week    Attends religious service: Never    Active member of club or organization: No    Attends meetings of clubs or organizations: Never    Relationship status: Divorced  . Intimate partner violence:    Fear of current or ex partner: No    Emotionally abused: No    Physically abused: No    Forced sexual activity: No  Other Topics Concern  . Not on file  Social History Narrative   Lives with her daughter and aunt in a one story home.  Has 1 daughter.  On disability.  Did work as a Optometrist and bus Geophysicist/field seismologist.  Education: some  college.      Family History  Problem Relation Age of Onset  . Cervical cancer Mother   . Lung cancer Father   . Colon polyps Father        ???  . Pneumonia Brother   . Colon cancer Paternal Grandmother 88  . AAA (abdominal aortic aneurysm) Paternal Grandmother   . Breast cancer Paternal Grandmother   . Colon cancer Paternal Grandfather 32  . Barrett's esophagus Paternal Aunt   . Stomach cancer Neg Hx   . Pancreatic cancer Neg Hx     ROS: Otherwise negative unless mentioned in HPI  Physical Examination  Vitals:   01/19/19 0916 01/19/19 1317  BP: (!) 140/96 (!) 129/92  Pulse: 91 93  Resp:  19  Temp:  98 F (36.7 C)  SpO2:  99%   Body mass index is 30.6 kg/m.  General:  WDWN in NAD Gait: Not observed HENT: WNL, normocephalic Pulmonary: normal non-labored breathing, without Rales, rhonchi,  wheezing Cardiac: regular Skin: without rashes Vascular Exam/Pulses: symmetrical radial pulses Extremities: without ischemic changes, without Gangrene , without cellulitis; without open wounds;  Musculoskeletal: no muscle wasting or atrophy  Neurologic: A&O X 3 speech is fluent/normal Psychiatric:  The pt has Normal affect. Lymph:  Unremarkable  CBC    Component Value Date/Time   WBC 8.5 01/18/2019 0305   RBC 2.78 (L) 01/18/2019 0305   HGB 8.0 (L) 01/18/2019 0305   HGB 11.1 (L) 02/02/2013 0840   HCT 24.7 (L) 01/18/2019 0305   HCT 34.8 02/02/2013 0840   PLT 187 01/18/2019 0305   PLT 336 02/02/2013 0840   MCV 88.8 01/18/2019 0305   MCV 74.3 (L) 02/02/2013 0840   MCH 28.8 01/18/2019 0305   MCHC 32.4 01/18/2019 0305   RDW 14.8 01/18/2019 0305   RDW 21.1 (H) 02/02/2013 0840   LYMPHSABS 1.9 01/18/2019 0305   LYMPHSABS 0.8 (L) 02/02/2013 0840   MONOABS 1.0 01/18/2019 0305   MONOABS 0.3 02/02/2013 0840   EOSABS 0.2 01/18/2019 0305   EOSABS 0.0 02/02/2013 0840   BASOSABS 0.1  01/18/2019 0305   BASOSABS 0.0 02/02/2013 0840    BMET    Component Value Date/Time   NA  137 01/19/2019 0304   NA 140 02/02/2013 0840   K 4.0 01/19/2019 0304   K 3.9 02/02/2013 0840   CL 100 01/19/2019 0304   CL 97 (L) 02/02/2013 0840   CO2 24 01/19/2019 0304   CO2 30 (H) 02/02/2013 0840   GLUCOSE 74 01/19/2019 0304   GLUCOSE 241 (H) 02/02/2013 0840   BUN 8 01/19/2019 0304   BUN 25.7 02/02/2013 0840   CREATININE 2.89 (H) 01/19/2019 0304   CREATININE 1.27 (H) 01/24/2015 1106   CREATININE 1.4 (H) 02/02/2013 0840   CALCIUM 8.3 (L) 01/19/2019 0304   CALCIUM 8.7 02/02/2013 0840   GFRNONAA 18 (L) 01/19/2019 0304   GFRAA 21 (L) 01/19/2019 0304    COAGS: Lab Results  Component Value Date   INR 1.1 01/01/2019   INR 1.04 05/29/2017   INR 1.02 05/20/2017   PROTIME 21.6 (H) 02/02/2013   PROTIME 24.0 (H) 12/07/2012   PROTIME 12.0 11/21/2012     Non-Invasive Vascular Imaging:   Both cephalic veins appear to be thrombosed on vein map R basilic vein appears adequate for fistula conduit use L basilic vein appears to be marginal for fistula conduit use   ASSESSMENT/PLAN: This is a 53 y.o. female in need of permanent dialysis access on HD via R TDC on MWF schedule  - Vein mapping performed 6/15; R basilic appears adequate, L basilic appears marginal, cephalics are thrombosed bilaterally - Patient is frustrated and would prefer to d/c home tonight and come back as an outpatient for dialysis access surgery - Unsure if there is room on the schedule tomorrow for surgery if she agreed to stay - This will be discussed with on call vascular surgeon Dr. Donzetta Matters who will evaluate the patient and provide further treatment plan  Dagoberto Ligas PA-C [facility] [phone]  I have independently interviewed and examined patient and agree with PA assessment and plan above.  Patient appears to have suitable basilic vein on the right for fistula creation and she appears to have codominant upper extremities.  Current tunneled dialysis catheter working well.  We can plan  fistula versus graft on Monday, April 27 for if she is to be discharged this could be handled as an outpatient.  Terral Cooks C. Donzetta Matters, MD [facility] [place] Office: [phone] Pager: [phone]

## 2019-01-19 NOTE — Plan of Care (Signed)
Spoke with patient.  She does need permanent access prior to discharge and is ok with going ahead and having that done here this admission.  Placed consult for vascular surgery for fistula.  Vein mapping is complete.    [name], MD

## 2019-01-19 NOTE — Progress Notes (Signed)
PROGRESS NOTE    Summer Cook  ZSW:[id number] DOB: 08/13/1966 DOA: 01/06/2019 PCP: Redmond School, MD   Brief Narrative: 53 year old female with history of hypertension, CAD, GERD, ITP status post splenectomy, PE in 2013, chronic kidney disease stage III and recent AKI on CKD who underwent kidney biopsy on 01/03/2019 with subsequent left flank pain since discharge associated with fever and cough. In the ED, CT scan of the chest abdomen and pelvis showed cardiomegaly with bilateral pleural effusions and findings consistent with pulmonary edema along with patchy bilateral groundglass opacities in both lungs. CT scan of the abdomen and pelvis showed a recent left renal biopsy with moderate sized left extrarenal perinephric hematoma with a small foci of air within the hematoma raising question of superimposed infection. She was admitted for the same. Nephrology was consulted. During the hospitalization, her respiratory status worsened. PCCM was also consulted. COVID-19 was tested which turned out negative. Initially she was kept on isolation but after discussion with ID, droplet and contact precautions were discontinued. Patient is currently getting hemodialysis and will require permanent vascular access for hemodialysis and an outpatient hemodialysis chair prior to discharge home.   Assessment & Plan:   Principal Problem:   Acute renal failure superimposed on stage 3 chronic kidney disease (HCC) Active Problems:   Essential hypertension   Acute respiratory failure with hypoxia (HCC)   Stage 3 chronic kidney disease (HCC)   Diabetes mellitus type 2, uncomplicated (HCC)   CAD (coronary artery disease): total distal RCA EF normal (05/20/17)   Fever   CKD (chronic kidney disease), stage V (HCC)   Cough   Renal hematoma, left   Acute on chronic systolic congestive heart failure (HCC)   Iron deficiency anemia  #1.  Acute renal failure superimposed on stage III chronic kidney disease versus  progressive chronic kidney disease. Patient presented with worsening renal function with oliguria and fluid overload.  Patient underwent renal biopsy on January 03, 2019 which showed TMA from severe hypertension, collapsing FSGS superimposed ATN with moderate scarring.  She is currently receiving hemodialysis to worsening renal function and fluid overload..  Renal function has remained stable but not improving much nephrology is planning for permanent hemodialysis access and the provision of outpatient hemodialysis chair prior to discharge home. Plan was discussed with patient and she is in agreement. Continue to monitor renal function Avoid nephrotoxic medications Nephrology on board  #2. Acute hypoxic respiratory failure likely due to acute renal failure. Presented with worsening hypoxic respiratory failure which was initially thought to be COVID-19.  She is negative for coronary.  Clinical and respiratory conditions improved with hemodialysis 2D echo with ejection fraction of 35 to 40% with left ventricular diffuse hypokinesis.  Comparison with echocardiogram of May 21, 2017 showed normaL ejection fraction.  #3. Fever/Left extrarenal perinephric hematoma. CT abdomen and pelvis was concerning for perinephric hematoma with possible superimposed infection.  Patient has been ruled out of COVID-19 assay.  Patient is status post 7-day course of IV cefepime.  She is currently afebrile.  Nephrology is on board.  #4.  Iron deficiency anemia.  Iron panel consistent with iron deficiency anemia.  She is status post 1 unit of packed red blood cell transfusion. Hemoglobin is currently 8.0 Continue to monitor CBC and transfuse if hemoglobin is less than 7.0.  #5.  Coronary artery disease with occlusion of PDA. Patient is medically managed prior to admission.  2D echo showed depressed ejection fraction of 35 to 40% which is worse when compared of  echocardiogram of 8 May 21, 2017. Continue with isosorbide,  Toprol, hydralazine, Lipitor per cardiology recommendation.  Patient underwent exostotic which was negative. Cardiology wants outpatient follow-up.  #6.  Hypertension blood pressures currently controlled with Toprol and hydralazine and isosorbide.  Norvasc was discontinued per cardiology recommendation.     DVT prophylaxis: Heparin subcu   Code Status: Full code   Family Communication:   Disposition Plan: Pending permanent vascular hemodialysis access on outpatient hemodialysis chair.   Consultants:   Nephrology   Procedures: None   Antimicrobials: None    Subjective: Patient denies any chest pain, palpitation or diaphoresis.  Also denies any abdominal pain, nausea or vomiting.  Patient is anxious to go home and wants to follow-up with [facility] for vascular access mapping.  Discussed with patient about need to get outpatient hemodialysis chair prior to discharge home.  She is currently getting hemodialysis and nephrology is on board.  Objective: Vitals:   01/19/19 0539 01/19/19 0540 01/19/19 0916 01/19/19 1317  BP: 118/77  (!) 140/96 (!) 129/92  Pulse: 92 93 91 93  Resp: 14   19  Temp: 98.1 F (36.7 C)   98 F (36.7 C)  TempSrc: Oral   Oral  SpO2:  97%  99%  Weight:      Height:        Intake/Output Summary (Last 24 hours) at 01/19/2019 1719 Last data filed at 01/19/2019 1600 Gross per 24 hour  Intake 3 ml  Output 2100 ml  Net -2097 ml   Filed Weights   01/18/19 1535 01/18/19 1920 01/19/19 0431  Weight: 84.6 kg 83 kg 83.4 kg    Examination:  General exam: Appears calm and comfortable.  Not in acute distress.  Obese Respiratory system: Clear to auscultation. Respiratory effort normal. Cardiovascular system: S1 & S2 heard, RRR. No JVD, murmurs, rubs, gallops or clicks. No pedal edema. Gastrointestinal system: Abdomen is nondistended, soft and nontender. No organomegaly or masses felt. Normal bowel sounds heard. Central nervous system: Alert and oriented. No focal  neurological deficits. Extremities: Symmetric 5 x 5 power.  1+ pitting pedal edema bilaterally.. Skin: No rashes, lesions or ulcers Psychiatry: Judgement and insight appear normal. Mood & affect appropriate.     Data Reviewed: I have personally reviewed following labs and imaging studies  CBC: Recent Labs  Lab 01/13/19 0229  01/14/19 0309 01/14/19 1324 01/15/19 0717 01/16/19 0351 01/18/19 0305  WBC 8.8  --  9.8  --  9.3  --  8.5  NEUTROABS 6.6  --   --   --   --   --  5.3  HGB 7.2*   < > 7.0* 7.7* 7.7* 7.6* 8.0*  HCT 23.1*   < > 22.7* 23.7* 25.0* 24.4* 24.7*  MCV 87.5  --  88.0  --  88.7  --  88.8  PLT 158  --  164  --  183  --  187   < > = values in this interval not displayed.   Basic Metabolic Panel: Recent Labs  Lab 01/13/19 0229  01/15/19 0332 01/16/19 0351 01/17/19 0258 01/18/19 0305 01/19/19 0304  NA 134*   < > 131* 129* 134* 133* 137  K 3.6   < > 3.7 3.8 3.7 3.6 4.0  CL 99   < > 95* 95* 96* 100 100  CO2 25   < > 21* 19* 21* 20* 24  GLUCOSE 84   < > 84 81 78 69* 74  BUN 12   < >  39* 50* 16 21* 8  CREATININE 2.73*   < > 5.39* 6.20* 2.82* 4.34* 2.89*  CALCIUM 7.8*   < > 8.2* 8.3* 7.7* 8.1* 8.3*  MG 2.6*  --   --   --   --   --   --   PHOS 2.9   < > 3.9 4.8* 2.2* 3.3 2.3*   < > = values in this interval not displayed.   GFR: Estimated Creatinine Clearance: 24 mL/min (A) (by C-G formula based on SCr of 2.89 mg/dL (H)). Liver Function Tests: Recent Labs  Lab 01/15/19 0332 01/16/19 0351 01/17/19 0258 01/18/19 0305 01/19/19 0304  ALBUMIN 2.1* 2.3* 2.3* 2.3* 2.5*   No results for input(s): LIPASE, AMYLASE in the last 168 hours. Recent Labs  Lab 01/16/19 0351  AMMONIA 11   Coagulation Profile: No results for input(s): INR, PROTIME in the last 168 hours. Cardiac Enzymes: No results for input(s): CKTOTAL, CKMB, CKMBINDEX, TROPONINI in the last 168 hours. BNP (last 3 results) No results for input(s): PROBNP in the last 8760 hours. HbA1C: No results  for input(s): HGBA1C in the last 72 hours. CBG: Recent Labs  Lab 01/17/19 0135 01/18/19 2108  GLUCAP 72 82   Lipid Profile: No results for input(s): CHOL, HDL, LDLCALC, TRIG, CHOLHDL, LDLDIRECT in the last 72 hours. Thyroid Function Tests: No results for input(s): TSH, T4TOTAL, FREET4, T3FREE, THYROIDAB in the last 72 hours. Anemia Panel: No results for input(s): VITAMINB12, FOLATE, FERRITIN, TIBC, IRON, RETICCTPCT in the last 72 hours. Sepsis Labs: No results for input(s): PROCALCITON, LATICACIDVEN in the last 168 hours.  Recent Results (from the past 240 hour(s))  MRSA PCR Screening     Status: None   Collection Time: 01/13/19  1:52 AM  Result Value Ref Range Status   MRSA by PCR NEGATIVE NEGATIVE Final    Comment:        The GeneXpert MRSA Assay (FDA approved for NASAL specimens only), is one component of a comprehensive MRSA colonization surveillance program. It is not intended to diagnose MRSA infection nor to guide or monitor treatment for MRSA infections. Performed at [facility], [place] [address], [place], [place] [address]          Radiology Studies: No results found.      Scheduled Meds: . atorvastatin  40 mg Oral q1800  . Chlorhexidine Gluconate Cloth  6 each Topical Q0600  . [START ON 01/20/2019] Chlorhexidine Gluconate Cloth  6 each Topical Q0600  . guaiFENesin  1,200 mg Oral BID  . hydrALAZINE  25 mg Oral TID  . isosorbide dinitrate  20 mg Oral BID  . mouth rinse  15 mL Mouth Rinse BID  . metoprolol succinate  50 mg Oral Daily  . sertraline  100 mg Oral Daily  . sodium chloride flush  3 mL Intravenous Q12H  . valACYclovir  1,000 mg Oral BID   Continuous Infusions: . sodium chloride 250 mL (01/11/19 1146)     LOS: 12 days        Elie Confer, MD [facility] Pager [phone]  If 7PM-7AM, please contact night-coverage [email or web address] Password [facility] 01/19/2019, 5:19 PM

## 2019-01-19 NOTE — Telephone Encounter (Signed)
01/19/19-pt currently hospitalized/al

## 2019-01-20 ENCOUNTER — Other Ambulatory Visit: Payer: Self-pay | Admitting: *Deleted

## 2019-01-20 DIAGNOSIS — R6889 Other general symptoms and signs: Secondary | ICD-10-CM

## 2019-01-20 DIAGNOSIS — R0602 Shortness of breath: Secondary | ICD-10-CM

## 2019-01-20 LAB — RENAL FUNCTION PANEL
Albumin: 2.3 g/dL — ABNORMAL LOW (ref 3.5–5.0)
Anion gap: 11 (ref 5–15)
BUN: 34 mg/dL — ABNORMAL HIGH (ref 6–20)
CO2: 22 mmol/L (ref 22–32)
Calcium: 8.1 mg/dL — ABNORMAL LOW (ref 8.9–10.3)
Chloride: 103 mmol/L (ref 98–111)
Creatinine, Ser: 4.06 mg/dL — ABNORMAL HIGH (ref 0.44–1.00)
GFR calc Af Amer: 14 mL/min — ABNORMAL LOW (ref >60)
GFR calc non Af Amer: 12 mL/min — ABNORMAL LOW (ref >60)
Glucose, Bld: 83 mg/dL (ref 70–99)
Phosphorus: 2.2 mg/dL — ABNORMAL LOW (ref 2.5–4.6)
Potassium: 3.7 mmol/L (ref 3.5–5.1)
Sodium: 136 mmol/L (ref 135–145)

## 2019-01-20 LAB — CBC
HCT: 23 % — ABNORMAL LOW (ref 36.0–46.0)
HCT: 22.2 % — ABNORMAL LOW (ref 36.0–46.0)
Hemoglobin: 7.2 g/dL — ABNORMAL LOW (ref 12.0–15.0)
Hemoglobin: 7.1 g/dL — ABNORMAL LOW (ref 12.0–15.0)
MCH: 28.2 pg (ref 26.0–34.0)
MCH: 28.9 pg (ref 26.0–34.0)
MCHC: 31.3 g/dL (ref 30.0–36.0)
MCHC: 32 g/dL (ref 30.0–36.0)
MCV: 90.2 fL (ref 80.0–100.0)
MCV: 90.2 fL (ref 80.0–100.0)
Platelets: 178 10*3/uL (ref 150–400)
Platelets: 142 10*3/uL — ABNORMAL LOW (ref 150–400)
RBC: 2.55 MIL/uL — ABNORMAL LOW (ref 3.87–5.11)
RBC: 2.46 MIL/uL — ABNORMAL LOW (ref 3.87–5.11)
RDW: 15 % (ref 11.5–15.5)
RDW: 15.2 % (ref 11.5–15.5)
WBC: 8.8 10*3/uL (ref 4.0–10.5)
WBC: 7.1 10*3/uL (ref 4.0–10.5)
nRBC: 1.8 % — ABNORMAL HIGH (ref 0.0–0.2)
nRBC: 2 % — ABNORMAL HIGH (ref 0.0–0.2)

## 2019-01-20 LAB — PREPARE RBC (CROSSMATCH)

## 2019-01-20 MED ORDER — ATORVASTATIN CALCIUM 40 MG PO TABS: 40.0000 mg | ORAL_TABLET | Freq: Every day | ORAL | 3 refills | Status: DC

## 2019-01-20 MED ORDER — HYDROCOD POLST-CPM POLST ER 10-8 MG/5ML PO SUER: 5.0000 mL | Freq: Two times a day (BID) | ORAL | 0 refills | Status: DC | PRN

## 2019-01-20 MED ORDER — ALPRAZOLAM 0.25 MG PO TABS: 0.2500 mg | ORAL_TABLET | Freq: Every evening | ORAL | 0 refills | Status: DC | PRN

## 2019-01-20 MED ORDER — HEPARIN SODIUM (PORCINE) 1000 UNIT/ML IJ SOLN
INTRAMUSCULAR | Status: AC
  Filled 2019-01-20: qty 4

## 2019-01-20 MED ORDER — METOPROLOL SUCCINATE ER 50 MG PO TB24: 50.0000 mg | ORAL_TABLET | Freq: Every day | ORAL | 3 refills | Status: DC

## 2019-01-20 MED ORDER — ALBUTEROL SULFATE (2.5 MG/3ML) 0.083% IN NEBU: 2.5000 mg | INHALATION_SOLUTION | RESPIRATORY_TRACT | 12 refills | Status: AC | PRN

## 2019-01-20 MED ORDER — SODIUM CHLORIDE 0.9% IV SOLUTION
Freq: Once | INTRAVENOUS | Status: AC
  Administered 2019-01-20: 13:00:00 via INTRAVENOUS

## 2019-01-20 MED ORDER — HYDRALAZINE HCL 25 MG PO TABS: 25.0000 mg | ORAL_TABLET | Freq: Three times a day (TID) | ORAL | 3 refills | Status: DC

## 2019-01-20 NOTE — Progress Notes (Signed)
Phone call to patient and instructed to be at [facility] admission department at 8 am on 01/23/2019 for surgery. Must have a driver and caregiver for discharge. NPO past MN night prior. Expect a call and follow the detailed surgery, medication and insulin adjustment instruction received from the hospital pre-admission department. Visitor restrictions explained.  Verbalized understanding. Left a message for Pre-admit that patient late add on for Monday.

## 2019-01-20 NOTE — Progress Notes (Signed)
Patient off floor in dialysis.

## 2019-01-20 NOTE — Care Management Important Message (Signed)
Important Message  Patient Details  Name: Summer Cook MRN: [id number] Date of Birth: 05-19-1966   Medicare Important Message Given:  Yes    Summer Cook 01/20/2019, 10:58 AM

## 2019-01-20 NOTE — Progress Notes (Signed)
Patient ID: Summer Cook, female   DOB: [birth year], 53 y.o.   MRN: [id number] [facility] Progress Note   Assessment/ Plan:   1. 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  There may be a degree of reversibility with regards to the ATN but at this time, will continue expectant monitoring with anticipation that she would likely need chronic hemodialysis - HD today  - Spoke with Dr. Marval Regal and she has been accepted to the [facility] for TTS 2nd shift with arrival at 11:30 (arrive at 11:00 on first treatment on 4/28).   - Fistula is scheduled as an outpatient for 4/27 - strict ins/outs - baseline Cr 1.5 - 2.0 per Epic data - From a medication standpoint, would adjust valtrex to 500 mg daily.  She is not sure why she takes this other than a fever blister.  Defer to primary team discretion  2.  Acute hypoxic respiratory failure: Cleared by pulmonology-low pretest clinical suspicion for COVID-19 with negative testing.  3.  Left extrarenal perinephric hematoma: Status post renal biopsy; hemodynamically stable.  No clear evidence of abscess   4.  Hyperphosphatemia: Secondary to advancing chronic kidney disease and acute kidney injury. Off of binders and following.    5.  Hyponatremia: Secondary to acute kidney injury/advancing chronic kidney disease. Improves with HD  6. Anemia  - Acute blood loss and AKI - on aranesp - 40 mcg dose rec'd on 4/22 - transfuse 1 unit PRBC's if possible prior to discharge  7. HTN  - follow on current regimen for now  [name], MD 01/20/2019     Subjective:   She is seen and examined on HD.  BP 140/94 and HR 98 and tolerating UF goal.  She has had no nausea today with treatment.  She has been accepted to [facility] for TTS HD.  She saw vascular last night and they are planning on an AVF  on 4/27 as an outpatient.  Patient is unsure of indication for valtrex other than a cold sore.     Review of systems:  Denies nausea Denies shortness of breath  No chest pain   Objective:   BP (!) 141/91 (BP Location: Right Arm)   Pulse (!) 102   Temp 98.9 F (37.2 C) (Oral)   Resp 19   Ht 5\' 5"  (1.651 m)   Wt 80.1 kg   LMP 08/13/2011   SpO2 100%   BMI 29.39 kg/m   Intake/Output Summary (Last 24 hours) at 01/20/2019 6283 Last data filed at 01/20/2019 0249 Gross per 24 hour  Intake 0 ml  Output 200 ml  Net -200 ml   Weight change:   Physical Exam:  Gen: adult female in bed in no acute distress   CVS:RRR; S1S2; no rub Resp: clear and unlabored Abd: Soft, flat, nontender Ext: no lower extremity edema Neuro - alert and oriented x 4; conversant and provides history Access Tunneled dialysis catheter   Imaging: No results found.   Labs: BMET Recent Labs  Lab 01/14/19 0309 01/15/19 6629 01/16/19 0351 01/17/19 0258 01/18/19 0305 01/19/19 0304 01/20/19 0212  NA 132* 131* 129* 134* 133* 137 136  K 3.9 3.7 3.8 3.7 3.6 4.0 3.7  CL 95* 95* 95* 96* 100 100 103  CO2 27 21* 19* 21* 20* 24 22  GLUCOSE 101* 84 81 78 69* 74 83  BUN  29* 39* 50* 16 21* 8 34*  CREATININE 4.27* 5.39* 6.20* 2.82* 4.34* 2.89* 4.06*  CALCIUM 8.0* 8.2* 8.3* 7.7* 8.1* 8.3* 8.1*  PHOS 3.1 3.9 4.8* 2.2* 3.3 2.3* 2.2*   CBC Recent Labs  Lab 01/14/19 0309  01/15/19 0717 01/16/19 0351 01/18/19 0305 01/20/19 0407  WBC 9.8  --  9.3  --  8.5 8.8  NEUTROABS  --   --   --   --  5.3  --   HGB 7.0*   < > 7.7* 7.6* 8.0* 7.2*  HCT 22.7*   < > 25.0* 24.4* 24.7* 23.0*  MCV 88.0  --  88.7  --  88.8 90.2  PLT 164  --  183  --  187 178   < > = values in this interval not displayed.    Medications:    . atorvastatin  40 mg Oral q1800  . Chlorhexidine Gluconate Cloth  6 each Topical Q0600  . Chlorhexidine Gluconate Cloth  6 each Topical Q0600  . guaiFENesin  1,200 mg Oral BID  . hydrALAZINE  25 mg  Oral TID  . isosorbide dinitrate  20 mg Oral BID  . mouth rinse  15 mL Mouth Rinse BID  . metoprolol succinate  50 mg Oral Daily  . sertraline  100 mg Oral Daily  . sodium chloride flush  3 mL Intravenous Q12H  . valACYclovir  1,000 mg Oral BID

## 2019-01-20 NOTE — Progress Notes (Signed)
Notified Blount, NP that pt had 2 very large BMs that were black. Will continue to monitor pt. Ranelle Oyster, RN

## 2019-01-20 NOTE — H&P (View-Only) (Signed)
[facility] [place]  Subjective  - No new complaints.  Wants to go home.   Objective 116/81 98 98.6 F (37 C) (Oral) 18 98%  Intake/Output Summary (Last 24 hours) at 01/20/2019 1405 Last data filed at 01/20/2019 1125 Gross per 24 hour  Intake 0 ml  Output -300 ml  Net 300 ml   Symmetric radial pulses BUE.   Laboratory Lab Results: Recent Labs    01/20/19 0407 01/20/19 1014  WBC 8.8 7.1  HGB 7.2* 7.1*  HCT 23.0* 22.2*  PLT 178 142*   BMET Recent Labs    01/19/19 0304 01/20/19 0212  NA 137 136  K 4.0 3.7  CL 100 103  CO2 24 22  GLUCOSE 74 83  BUN 8 34*  CREATININE 2.89* 4.06*  CALCIUM 8.3* 8.1*    COAG Lab Results  Component Value Date   INR 1.1 01/01/2019   INR 1.04 05/29/2017   INR 1.02 05/20/2017   PROTIME 21.6 (H) 02/02/2013   PROTIME 24.0 (H) 12/07/2012   PROTIME 12.0 11/21/2012   No results found for: PTT  Assessment/Planning:  53 year old female that vascular surgery was consulted yesterday for AV fistula access.  She currently has a tunneled dialysis catheter placed by IR.  She does not want to wait for dialysis access on Monday and wants to be discharged home.  I discussed in detail with her that given the COVID pandemic we are currently looking at scheduling outpatient fistulas into the middle of May.  Patient is fine with this plan and I have contacted our office to put her on the schedule for next available date on a nondialysis day given she should be dialyzing on Tuesday Thursday and Saturday.  Will be right upper arm basilic fistula.    Marty Heck 01/20/2019 2:05 PM --  Addendum: Nephrology discussed with Dr. Donzetta Matters who initially saw consult and felt patient needed urgent fistula placement.  Will schedule for Monday as outpatient.  Dr. Donzetta Matters has arranged.  Marty Heck, MD [facility] [place] Office: [phone] Pager: [phone]

## 2019-01-20 NOTE — Progress Notes (Addendum)
Notified Blount, NP that pt is requesting medication for diarrhea . No new orders given. Will continue to monitor pt. Ranelle Oyster, RN

## 2019-01-20 NOTE — Progress Notes (Addendum)
[facility]   Subjective  - No new complaints.  Wants to go home.   Objective 116/81 98 98.6 F (37 C) (Oral) 18 98%  Intake/Output Summary (Last 24 hours) at 01/20/2019 1405 Last data filed at 01/20/2019 1125 Gross per 24 hour  Intake 0 ml  Output -300 ml  Net 300 ml   Symmetric radial pulses BUE.   Laboratory Lab Results: Recent Labs    01/20/19 0407 01/20/19 1014  WBC 8.8 7.1  HGB 7.2* 7.1*  HCT 23.0* 22.2*  PLT 178 142*   BMET Recent Labs    01/19/19 0304 01/20/19 0212  NA 137 136  K 4.0 3.7  CL 100 103  CO2 24 22  GLUCOSE 74 83  BUN 8 34*  CREATININE 2.89* 4.06*  CALCIUM 8.3* 8.1*    COAG Lab Results  Component Value Date   INR 1.1 01/01/2019   INR 1.04 05/29/2017   INR 1.02 05/20/2017   PROTIME 21.6 (H) 02/02/2013   PROTIME 24.0 (H) 12/07/2012   PROTIME 12.0 11/21/2012   No results found for: PTT  Assessment/Planning:  53 year old female that vascular surgery was consulted yesterday for AV fistula access.  She currently has a tunneled dialysis catheter placed by IR.  She does not want to wait for dialysis access on Monday and wants to be discharged home.  I discussed in detail with her that given the COVID pandemic we are currently looking at scheduling outpatient fistulas into the middle of May.  Patient is fine with this plan and I have contacted our office to put her on the schedule for next available date on a nondialysis day given she should be dialyzing on Tuesday Thursday and Saturday.  Will be right upper arm basilic fistula.    Marty Heck 01/20/2019 2:05 PM --  Addendum: Nephrology discussed with Dr. Donzetta Matters who initially saw consult and felt patient needed urgent fistula placement.  Will schedule for Monday as outpatient.  Dr. Donzetta Matters has arranged.  Marty Heck, MD [facility] [place] Office: [phone] Pager: [phone]

## 2019-01-20 NOTE — Discharge Summary (Signed)
Physician Discharge Summary  Patient ID: Summer Cook MRN: [id number] DOB/AGE: 1965/10/02 53 y.o.  Admit date: 01/06/2019 Discharge date: 01/20/2019  Admission Diagnoses: Acute renal failure superimposed on chronic kidney disease  Discharge Diagnoses:  Principal Problem:   Acute renal failure superimposed on stage 3 chronic kidney disease (HCC) Active Problems:   Essential hypertension   Acute respiratory failure with hypoxia (HCC)   Stage 3 chronic kidney disease (HCC)   Diabetes mellitus type 2, uncomplicated (HCC)   CAD (coronary artery disease): total distal RCA EF normal (05/20/17)   Fever   CKD (chronic kidney disease), stage V (HCC)   Cough   Renal hematoma, left   Acute on chronic systolic congestive heart failure (HCC)   Iron deficiency anemia   Discharged Condition: stable  Hospital Course:  53 year old female with history of hypertension, CAD, GERD, ITP status post splenectomy, PE in 2013, chronic kidney disease stage III and recent AKI on CKD who underwent kidney biopsy on 01/03/2019 with subsequent left flank pain since discharge associated with fever and cough. In the ED, CT scan of the chest abdomen and pelvis showed cardiomegaly with bilateral pleural effusions and findings consistent with pulmonary edema along with patchy bilateral groundglass opacities in both lungs. CT scan of the abdomen and pelvis showed a recent left renal biopsy with moderate sized left extrarenal perinephric hematoma with a small foci of air within the hematoma raising question of superimposed infection. She was admitted for the same. Nephrology was consulted. During the hospitalization, her respiratory status worsened. PCCM was also consulted. COVID-19 was tested which turned out negative. Initially she was kept on isolation but after discussion with ID, droplet and contact precautions were discontinued. Patient is currently getting hemodialysis.  She has outpatient hemodialysis chair  arranged by nephrology.  She is clinically stable and is discharged home to continue with hemodialysis.  Assessments #1.  Acute renal failure superimposed on stage III chronic kidney disease versus progressive chronic kidney disease. Patient presented with worsening renal function with oliguria and fluid overload.  Patient underwent renal biopsy on January 03, 2019 which showed TMA from severe hypertension, collapsing FSGS superimposed ATN with moderate scarring.  She is currently receiving hemodialysis to worsening renal function and fluid overload..  Renal function has remained stable but not improving much. Patient is to continue with his schedule outpatient hemodialysis and to continue to follow nephrologist as scheduled.  #2. Acute hypoxic respiratory failure likely due to acute renal failure. Presented with worsening hypoxic respiratory failure which was initially thought to be COVID-19.  She is negative for coronary.  Clinical and respiratory conditions improved with hemodialysis 2D echo with ejection fraction of 35 to 40% with left ventricular diffuse hypokinesis.  Comparison with echocardiogram of May 21, 2017 showed normaL ejection fraction. Continue to follow-up with primary cardiologist as scheduled  #3. Fever/Left extrarenal perinephric hematoma. CT abdomen and pelvis was concerning for perinephric hematoma with possible superimposed infection.  Patient has been ruled out of COVID-19 assay.  Patient is status post 7-day course of IV cefepime.  She is currently afebrile and stable for discharge home  #4.  Iron deficiency anemia.  Iron panel consistent with iron deficiency anemia.  She is status post 1 unit of packed red blood cell transfusion. Hemoglobin is currently 8.0 Continue to follow-up with primary care physician. Needs CBC follow-up by primary care physician  #5.  Coronary artery disease with occlusion of PDA. Patient is medically managed prior to admission.  2D echo  showed depressed  ejection fraction of 35 to 40% which is worse when compared of echocardiogram of 8 May 21, 2017. Continue with isosorbide, Toprol, hydralazine, Lipitor per cardiology recommendation.   Cardiology wants outpatient follow-up.  #6.  Hypertension blood pressures currently controlled with Toprol and hydralazine and isosorbide.  Norvasc was discontinued per cardiology recommendation.  Continue with current home medications as recommended by cardiology.    Consults: nephrology  Significant Diagnostic Studies: labs: cbc, cmp and radiology:   Treatments: IV hydration, antibiotics: ceftriaxone and dialysis: Hemodialysis  Discharge Exam: Blood pressure 122/75, pulse 95, temperature 99.1 F (37.3 C), temperature source Oral, resp. rate 18, height 5\' 5"  (1.651 m), weight 79.3 kg, last menstrual period 08/13/2011, SpO2 98 %.   General appearance: alert, cooperative and appears stated age Head: Normocephalic, without obvious abnormality, atraumatic Eyes: conjunctivae/corneas clear. PERRL, EOM's intact. Fundi benign. Nose: Nares normal. Septum midline. Mucosa normal. No drainage or sinus tenderness. Throat: lips, mucosa, and tongue normal; teeth and gums normal Neck: no adenopathy, no carotid bruit, no JVD, supple, symmetrical, trachea midline and thyroid not enlarged, symmetric, no tenderness/mass/nodules Resp: clear to auscultation bilaterally Chest wall: no tenderness Cardio: regular rate and rhythm, S1, S2 normal, no murmur, click, rub or gallop GI: soft, non-tender; bowel sounds normal; no masses,  no organomegaly Extremities: extremities normal, atraumatic, no cyanosis or edema Skin: Skin color, texture, turgor normal. No rashes or lesions Neurologic: Alert and oriented X 3, normal strength and tone. Normal symmetric reflexes. Normal coordination and gait  Disposition: Discharge disposition: 01-Home or Self Care       Discharge Instructions    Diet - low sodium  heart healthy   Complete by:  As directed    Increase activity slowly   Complete by:  As directed      Allergies as of 01/20/2019      Reactions   Yellow Jacket Venom Swelling   Swelling at injection site   Dilaudid [hydromorphone Hcl] Anxiety, Other (See Comments)   Causes hallucinations, anxiety, and panic per patient   Adhesive [tape] Rash   Please use paper tape   Brintellix [vortioxetine] Itching   Cymbalta [duloxetine Hcl] Itching   Penicillins Itching   Did it involve swelling of the face/tongue/throat, SOB, or low BP? No Did it involve sudden or severe rash/hives, skin peeling, or any reaction on the inside of your mouth or nose? No Did you need to seek medical attention at a hospital or doctor's office? No When did it last happen?53 yrs old If all above answers are "NO", may proceed with cephalosporin use.      Medication List    STOP taking these medications   acetaminophen 500 MG tablet Commonly known as:  TYLENOL   albuterol 108 (90 Base) MCG/ACT inhaler Commonly known as:  VENTOLIN HFA Replaced by:  albuterol (2.5 MG/3ML) 0.083% nebulizer solution   ipratropium-albuterol 0.5-2.5 (3) MG/3ML Soln Commonly known as:  DUONEB   sertraline 100 MG tablet Commonly known as:  ZOLOFT     TAKE these medications   albuterol (2.5 MG/3ML) 0.083% nebulizer solution Commonly known as:  PROVENTIL Take 3 mLs (2.5 mg total) by nebulization every 4 (four) hours as needed for shortness of breath. Replaces:  albuterol 108 (90 Base) MCG/ACT inhaler   ALPRAZolam 0.25 MG tablet Commonly known as:  XANAX Take 1 tablet (0.25 mg total) by mouth at bedtime as needed for anxiety. What changed:    medication strength  how much to take  when to take this  reasons to take this   amLODipine 10 MG tablet Commonly known as:  NORVASC Take 1 tablet (10 mg total) by mouth daily for 30 days.   aspirin 81 MG EC tablet Take 1 tablet (81 mg total) by mouth daily.    atorvastatin 40 MG tablet Commonly known as:  LIPITOR Take 1 tablet (40 mg total) by mouth daily at 6 PM.   chlorpheniramine-HYDROcodone 10-8 MG/5ML Suer Commonly known as:  TUSSIONEX Take 5 mLs by mouth every 12 (twelve) hours as needed for cough.   diclofenac sodium 1 % Gel Commonly known as:  VOLTAREN Apply 2 g topically 4 (four) times daily. What changed:    when to take this  reasons to take this   hydrALAZINE 25 MG tablet Commonly known as:  APRESOLINE Take 1 tablet (25 mg total) by mouth 3 (three) times daily.   isosorbide dinitrate 10 MG tablet Commonly known as:  ISORDIL Take 1 tablet (10 mg total) by mouth 2 (two) times daily for 30 days.   metoprolol succinate 50 MG 24 hr tablet Commonly known as:  TOPROL-XL Take 1 tablet (50 mg total) by mouth daily. Take with or immediately following a meal. Start taking on:  January 21, 2019   metoprolol tartrate 25 MG tablet Commonly known as:  LOPRESSOR Take 1 tablet (25 mg total) by mouth 2 (two) times daily for 30 days.   nitroGLYCERIN 0.4 MG SL tablet Commonly known as:  NITROSTAT Place 1 tablet (0.4 mg total) under the tongue every 5 (five) minutes x 3 doses as needed for chest pain.   sodium bicarbonate 650 MG tablet Take 1 tablet (650 mg total) by mouth 2 (two) times daily for 30 days.   venlafaxine 37.5 MG tablet Commonly known as:  EFFEXOR Take 37.5 mg by mouth 2 (two) times daily.      Follow-up Information    Charlie Pitter, PA-C Follow up on 01/26/2019.   Specialties:  Cardiology, Radiology Why:  You have been scheduled for a virtual visit on 01/26/2019 at 9:00am. You will be contacted 2-3 days prior to your appointment to discuss additional instructions.  Contact information: [address] [place] [address] [phone]           Signed: Elie Confer 01/20/2019, 5:56 PM

## 2019-01-20 NOTE — Progress Notes (Signed)
Dr. Junious Dresser notified about patient having multiple episodes of watery  diarrhea over the past several days.

## 2019-01-20 NOTE — Progress Notes (Signed)
Patient has been accepted at [facility] in [place] for OP HD with a TTS seat time of 11:45am. She is asked to arrive at 11:30am for her appointments and will need to arrive at 11:00am on her first day of treatment to sign paperwork. Dialysis Coordinator has contacted Dr. Bartholomew Boards to make aware and Dr. Royce Macadamia states she will notify patient.   [facility] [address] [place], [place] [address] [phone]  Alphonzo Cruise Dialysis Coordinator [phone]

## 2019-01-20 NOTE — Progress Notes (Signed)
Discharge instructions reviewed with patient. Meds, diet, activity, and follow up appointments reviewed and all questions answered. Copy of instructions given to patient and meds sent to pharmacy [facility] in [place]. Patient discharged via wheelchair with daughter.

## 2019-01-21 ENCOUNTER — Ambulatory Visit (HOSPITAL_COMMUNITY): Payer: Medicare Other | Admitting: Anesthesiology

## 2019-01-21 ENCOUNTER — Encounter (HOSPITAL_COMMUNITY): Payer: Self-pay | Admitting: *Deleted

## 2019-01-21 ENCOUNTER — Other Ambulatory Visit: Payer: Self-pay

## 2019-01-21 LAB — BPAM RBC
Blood Product Expiration Date: 202005022359
ISSUE DATE / TIME: 202004241251
Unit Type and Rh: 6200

## 2019-01-21 LAB — TYPE AND SCREEN
ABO/RH(D): A POS
Antibody Screen: NEGATIVE
Unit division: 0

## 2019-01-21 NOTE — Progress Notes (Signed)
Chart reviewed with Dr. Roanna Banning due to cardiac history.

## 2019-01-23 ENCOUNTER — Encounter (HOSPITAL_COMMUNITY): Payer: Self-pay | Admitting: *Deleted

## 2019-01-23 ENCOUNTER — Other Ambulatory Visit: Payer: Self-pay

## 2019-01-23 ENCOUNTER — Encounter (HOSPITAL_COMMUNITY)
Admission: RE | Disposition: A | Discharge status: Home / Self Care | Payer: Self-pay | Source: Home / Self Care | Attending: Vascular Surgery

## 2019-01-23 ENCOUNTER — Ambulatory Visit (HOSPITAL_COMMUNITY)
Admission: RE | Admit: 2019-01-23 | Discharge: 2019-01-23 | Disposition: A | Discharge status: Home / Self Care | Payer: Medicare Other | Attending: Vascular Surgery | Admitting: Vascular Surgery

## 2019-01-23 DIAGNOSIS — Z539 Procedure and treatment not carried out, unspecified reason: Secondary | ICD-10-CM | POA: Diagnosis not present

## 2019-01-23 HISTORY — DX: Dependence on renal dialysis: Z99.2

## 2019-01-23 HISTORY — DX: End stage renal disease: N18.6

## 2019-01-23 HISTORY — PX: AV FISTULA PLACEMENT: SHX1204

## 2019-01-23 LAB — GLUCOSE, CAPILLARY
Glucose-Capillary: 102 mg/dL — ABNORMAL HIGH (ref 70–99)
Glucose-Capillary: 80 mg/dL (ref 70–99)
Glucose-Capillary: 81 mg/dL (ref 70–99)

## 2019-01-23 LAB — POCT I-STAT 4, (NA,K, GLUC, HGB,HCT)
Glucose, Bld: 77 mg/dL (ref 70–99)
HCT: 28 % — ABNORMAL LOW (ref 36.0–46.0)
Hemoglobin: 9.5 g/dL — ABNORMAL LOW (ref 12.0–15.0)
Potassium: 3.5 mmol/L (ref 3.5–5.1)
Sodium: 134 mmol/L — ABNORMAL LOW (ref 135–145)

## 2019-01-23 SURGERY — ARTERIOVENOUS (AV) FISTULA CREATION
Anesthesia: Monitor Anesthesia Care | Laterality: Right

## 2019-01-23 MED ORDER — MIDAZOLAM HCL 2 MG/2ML IJ SOLN
INTRAMUSCULAR | Status: AC
  Filled 2019-01-23: qty 2

## 2019-01-23 MED ORDER — PROPOFOL 10 MG/ML IV BOLUS
INTRAVENOUS | Status: AC
  Filled 2019-01-23: qty 20

## 2019-01-23 MED ORDER — CHLORHEXIDINE GLUCONATE 4 % EX LIQD: 60.0000 mL | Freq: Once | CUTANEOUS | Status: DC

## 2019-01-23 MED ORDER — VANCOMYCIN HCL IN DEXTROSE 1-5 GM/200ML-% IV SOLN
1000.0000 mg | INTRAVENOUS | Status: AC
  Administered 2019-01-23: 1000 mg via INTRAVENOUS
  Filled 2019-01-23: qty 200

## 2019-01-23 MED ORDER — FENTANYL CITRATE (PF) 250 MCG/5ML IJ SOLN
INTRAMUSCULAR | Status: AC
  Filled 2019-01-23: qty 5

## 2019-01-23 MED ORDER — SODIUM CHLORIDE 0.9 % IV SOLN
INTRAVENOUS | Status: DC
  Administered 2019-01-23: 08:00:00 via INTRAVENOUS

## 2019-01-23 SURGICAL SUPPLY — 32 items
ADH SKN CLS APL DERMABOND .7 (GAUZE/BANDAGES/DRESSINGS) ×1
ARMBAND PINK RESTRICT EXTREMIT (MISCELLANEOUS) ×4 IMPLANT
CANISTER SUCT 3000ML PPV (MISCELLANEOUS) ×2 IMPLANT
CANNULA VESSEL 3MM 2 BLNT TIP (CANNULA) ×2 IMPLANT
CLIP VESOCCLUDE MED 6/CT (CLIP) ×2 IMPLANT
CLIP VESOCCLUDE SM WIDE 6/CT (CLIP) ×2 IMPLANT
COVER PROBE W GEL 5X96 (DRAPES) IMPLANT
COVER WAND RF STERILE (DRAPES) ×2 IMPLANT
DECANTER SPIKE VIAL GLASS SM (MISCELLANEOUS) ×2 IMPLANT
DERMABOND ADVANCED (GAUZE/BANDAGES/DRESSINGS) ×1
DERMABOND ADVANCED .7 DNX12 (GAUZE/BANDAGES/DRESSINGS) ×1 IMPLANT
DRAIN PENROSE 1/4X12 LTX STRL (WOUND CARE) ×2 IMPLANT
ELECT REM PT RETURN 9FT ADLT (ELECTROSURGICAL) ×2
ELECTRODE REM PT RTRN 9FT ADLT (ELECTROSURGICAL) ×1 IMPLANT
GLOVE BIO SURGEON STRL SZ7.5 (GLOVE) ×2 IMPLANT
GOWN STRL REUS W/ TWL LRG LVL3 (GOWN DISPOSABLE) ×3 IMPLANT
GOWN STRL REUS W/TWL LRG LVL3 (GOWN DISPOSABLE) ×6
KIT BASIN OR (CUSTOM PROCEDURE TRAY) ×2 IMPLANT
KIT TURNOVER KIT B (KITS) ×2 IMPLANT
LOOP VESSEL MINI RED (MISCELLANEOUS) IMPLANT
NS IRRIG 1000ML POUR BTL (IV SOLUTION) ×2 IMPLANT
PACK CV ACCESS (CUSTOM PROCEDURE TRAY) ×2 IMPLANT
PAD ARMBOARD 7.5X6 YLW CONV (MISCELLANEOUS) ×4 IMPLANT
SPONGE SURGIFOAM ABS GEL 100 (HEMOSTASIS) IMPLANT
SUT PROLENE 6 0 BV (SUTURE) ×2 IMPLANT
SUT PROLENE 7 0 BV 1 (SUTURE) ×2 IMPLANT
SUT VIC AB 3-0 SH 27 (SUTURE) ×2
SUT VIC AB 3-0 SH 27X BRD (SUTURE) ×1 IMPLANT
SUT VICRYL 4-0 PS2 18IN ABS (SUTURE) ×2 IMPLANT
TOWEL GREEN STERILE (TOWEL DISPOSABLE) ×2 IMPLANT
UNDERPAD 30X30 (UNDERPADS AND DIAPERS) ×2 IMPLANT
WATER STERILE IRR 1000ML POUR (IV SOLUTION) ×2 IMPLANT

## 2019-01-23 NOTE — Telephone Encounter (Signed)
01/23/19-currently hospitalized/al

## 2019-01-23 NOTE — Progress Notes (Signed)
Surgery cancelled due to Dr. Oneida Alar having an emergency surgery case. Pt. Given instructions to call his office to reschedule her surgery. Pt. Verbalizes understanding. Discharged to home with daughter.

## 2019-01-23 NOTE — Anesthesia Preprocedure Evaluation (Deleted)
Anesthesia Evaluation  Patient identified by MRN, date of birth, ID band Patient awake    Reviewed: Allergy & Precautions, NPO status , Patient's Chart, lab work & pertinent test results  Airway Mallampati: II  TM Distance: >3 FB     Dental   Pulmonary asthma , former smoker,    breath sounds clear to auscultation       Cardiovascular hypertension, Pt. on medications and Pt. on home beta blockers + CAD, + Past MI and +CHF   Rhythm:Regular Rate:Normal     Neuro/Psych CVA    GI/Hepatic Neg liver ROS, GERD  ,  Endo/Other  diabetes  Renal/GU ESRF and DialysisRenal disease     Musculoskeletal   Abdominal   Peds  Hematology   Anesthesia Other Findings   Reproductive/Obstetrics                            Lab Results  Component Value Date   WBC 7.1 01/20/2019   HGB 9.5 (L) 01/23/2019   HCT 28.0 (L) 01/23/2019   MCV 90.2 01/20/2019   PLT 142 (L) 01/20/2019   Lab Results  Component Value Date   CREATININE 4.06 (H) 01/20/2019   BUN 34 (H) 01/20/2019   NA 134 (L) 01/23/2019   K 3.5 01/23/2019   CL 103 01/20/2019   CO2 22 01/20/2019    Anesthesia Physical Anesthesia Plan  ASA: III  Anesthesia Plan: MAC   Post-op Pain Management:    Induction: Intravenous  PONV Risk Score and Plan: 2 and Propofol infusion, Ondansetron and Treatment may vary due to age or medical condition  Airway Management Planned: Simple Face Mask and Natural Airway  Additional Equipment:   Intra-op Plan:   Post-operative Plan:   Informed Consent: I have reviewed the patients History and Physical, chart, labs and discussed the procedure including the risks, benefits and alternatives for the proposed anesthesia with the patient or authorized representative who has indicated his/her understanding and acceptance.       Plan Discussed with: CRNA  Anesthesia Plan Comments:         Anesthesia Quick  Evaluation

## 2019-01-24 ENCOUNTER — Encounter (HOSPITAL_COMMUNITY): Payer: Self-pay | Admitting: Anesthesiology

## 2019-01-24 ENCOUNTER — Other Ambulatory Visit: Payer: Self-pay | Admitting: *Deleted

## 2019-01-24 ENCOUNTER — Encounter (HOSPITAL_COMMUNITY): Payer: Self-pay | Admitting: Vascular Surgery

## 2019-01-24 ENCOUNTER — Telehealth: Payer: Self-pay | Admitting: *Deleted

## 2019-01-24 DIAGNOSIS — D509 Iron deficiency anemia, unspecified: Secondary | ICD-10-CM | POA: Diagnosis not present

## 2019-01-24 DIAGNOSIS — N170 Acute kidney failure with tubular necrosis: Secondary | ICD-10-CM | POA: Diagnosis not present

## 2019-01-24 DIAGNOSIS — N2581 Secondary hyperparathyroidism of renal origin: Secondary | ICD-10-CM | POA: Diagnosis not present

## 2019-01-24 DIAGNOSIS — D649 Anemia, unspecified: Secondary | ICD-10-CM | POA: Diagnosis not present

## 2019-01-24 NOTE — Telephone Encounter (Signed)
See phone note 01/13/19

## 2019-01-24 NOTE — Progress Notes (Signed)
Call to patient to re-schedule surgery. Instructed to be at [facility] admitting at 5:30 am on 01/30/2019. NPO past MN night prior must have a driver and caregiver for discharge. Expect a call and follow the detailed surgery instructions received from the hospital pre-admission department. Visitor restrictions reviewed. Verbalized understanding.

## 2019-01-24 NOTE — Telephone Encounter (Signed)
Patient understands to follow up Virtual Visit with Melina Copa, PA on Thursday January 26, 2019 9 AM. I confirmed pt is active in [facility] and knows to expect a call from our office prior to appt to review instructions for Virtual Visit. Patient understands discharge instructions? Yes Metoprolol tartrate 25 bid (lopressor)and Metoprolol succinate XL 50 daily (Toprol XL) were on patient's discharge medication list. Hospital notes indicate pt should be taking metoprolol succinate XL 50 mg daily-I confirmed with patient this is the medication she is taking. Patient understands medications and regiment? yes Patient understands to have all medications to this visit? Yes

## 2019-01-24 NOTE — Progress Notes (Signed)
Virtual Visit via Video Note   This visit type was conducted due to national recommendations for restrictions regarding the COVID-19 Pandemic (e.g. social distancing) in an effort to limit this patient's exposure and mitigate transmission in our community.  Due to her co-morbid illnesses, this patient is at least at moderate risk for complications without adequate follow up.  This format is felt to be most appropriate for this patient at this time.  All issues noted in this document were discussed and addressed.  A limited physical exam was performed with this format.  Please refer to the patient's chart for her consent to telehealth for [facility].   Evaluation Performed:  Follow-up visit  Date:  01/26/2019   ID:  Summer Cook, DOB 03/07/1966, MRN [id number]  Patient Location: Home Provider Location: Home  PCP:  Redmond School, MD  Cardiologist:  Rozann Lesches, MD  Electrophysiologist:  None   Chief Complaint:  Post-hospital f/u after stress test  History of Present Illness:    Summer Cook is a 53 y.o. female with CAD s/p NSTEMI with totally occluded dPDA in 2018 (medically managed), HTN, HLD,  ITP s/p slpenectomy, steroid induced DM, stroke 2017, prior PE 2015, recent AKI on CKD progressing to dialysis, perinephric hematoma, iron deficiency anemia and acute combined CHF.  [name] was recently admitted to the hospital from 12/30/2018-01/04/2019 after presenting with persistent N/V and dysuria. She was found to have acute on chronic renal insufficiency c/f AIN vs GN. She underwent a renal biopsy 01/03/2019 and was discharged home the following day. Her HTN management was adjusted given hypertensive urgency on admission with improvement in BPs. After discharge, she developed a cough and fevers prompting her to present back to the ED for further evaluation on 01/06/2019. CT C/A/P on admission revealed pulmonary edema, patchy bilateral ground-glass opacities in both lungs (infectious  vs inflammatory) c/f COVID-19, and a moderate-sized left extrarenal perinephric hematoma. 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 Unfortunately her renal function continued to decline and the patient became progressively SOB 2/2 volume overload (COVID-19 testing was negative). Trial of high does IV Lasix was unsuccessful and ultimately patient had a tunneled catheter placed and was started on dialysis. Echocardiogram 01/10/2019 revealed EF 35-40%, G2DD, diffuse LV hypokinesis, moderate MR, and mild pulmonary HTN. Cardiology was asked to evaluate patient given acute combined CHF. She underwent Lexiscan nuclear stress test 01/12/19 showing small area of irreversible defect in the apical anterior wall consistent with prior infarct and no ischemia, LVEF 37% with diffuse hypokinesis. Isordil and hydralazine were added with recommendation to stop amlodipine although this was still on her med list. No further cardiac testing was recommended. Her last labs showed Hgb 9.5, Na 134, K 3.5, albumin 2.4; 2018 LDL was 149.  She is seen today in virtual follow-up doing well from a cardiac standpoint. BP has been well controlled. No further swelling. No issues with dialysis sessions. No chest pain. She is slowly regaining her strength. No SOB. Daughter has been helping at home.   Past Medical History:  Diagnosis Date   Allergy    Anxiety    Asthma    CAD (coronary artery disease), native coronary artery    a. cath 05/20/17 - 100% distal PDA --> medical mangement (no intervention done)   Candida esophagitis (HCC)    Chronic combined systolic and diastolic CHF (congestive heart failure) ([place])    Depression  End stage renal disease on dialysis [facility])    T/ Th/ Sat starting 4/28- [facility]   Essential hypertension, benign    GERD (gastroesophageal reflux disease)    Gout    Hemorrhoids    ITP  (idiopathic thrombocytopenic purpura) 08/2010   Treated with Prednisone, IVIg (transient response), Rituxan (no response), Cytoxan (no response).  Last was Prednisone 65m; 2 wk in 10/2012.  She also was on Prednisone bridged with Cellcept briefly but stopped due to lack of insurance.    Myocardial infarction ([facility]    Perinephric hematoma    [place]   Serrated adenoma of colon    [place]    [place]    Thrush, oral 05/29/2011   Past Surgical History:  Procedure Laterality Date   AV FISTULA PLACEMENT Right 01/23/2019   Procedure: ARTERIOVENOUS (AV) FISTULA CREATION RIGHT ARM;  Surgeon: FElam Dutch MD;  [facility]  Service: Vascular;  Laterality: Right;   BONE MARROW BIOPSY     CARDIAC CATHETERIZATION     CARPAL TUNNEL RELEASE     CARPAL TUNNEL RELEASE Left 05/20/2016   Procedure: CARPAL TUNNEL RELEASE;  Surgeon: SCarole Civil MD;  Location: [facility];  Service: Orthopedics;  Laterality: Left;   CARPAL TUNNEL RELEASE Right 07/16/2016   Procedure: RIGHT CARPAL TUNNEL RELEASE;  Surgeon: SCarole Civil MD;  Location: [facility];  Service: Orthopedics;  Laterality: Right;   CATARACT EXTRACTION     CHOLECYSTECTOMY  2008   COLONOSCOPY WITH ESOPHAGOGASTRODUODENOSCOPY (EGD) N/A 01/04/2013   Dr. RGala Romney esophageal plaques with +KOH, hh. Gastric antrum abnormal, bx reactive gastropathy. Anal canal hemorrhoids, colonic tics, normal TI, single polyp (sessile serrated tubular adenoma). Next TCS 12/2017   IR FLUORO GUIDE CV LINE RIGHT  01/10/2019   IR [place] VASC ACCESS RIGHT  01/10/2019   LEFT HEART CATH AND CORONARY ANGIOGRAPHY N/A 05/20/2017   Procedure: LEFT HEART CATH AND CORONARY ANGIOGRAPHY;  Surgeon: JMartinique Peter M, MD;  [facility];  Service: Cardiovascular;  Laterality: N/A;   SPLENECTOMY, TOTAL  01/2011   TEE WITHOUT CARDIOVERSION N/A 01/03/2016   Procedure: TRANSESOPHAGEAL ECHOCARDIOGRAM (TEE)  WITH PROPOFOL;  Surgeon: SSatira Sark MD;  [facility];  Service: Cardiovascular;  Laterality: N/A;     Current Meds  Medication Sig   acetaminophen (TYLENOL) 500 MG tablet Take 1,000 mg by mouth every 6 (six) hours as needed for moderate pain.   albuterol (PROVENTIL) (2.5 MG/3ML) 0.083% nebulizer solution Take 3 mLs (2.5 mg total) by nebulization every 4 (four) hours as needed for shortness of breath.   ALPRAZolam (XANAX) 0.25 MG tablet Take 1 tablet (0.25 mg total) by mouth at bedtime as needed for anxiety.   amLODipine (NORVASC) 10 MG tablet Take 1 tablet (10 mg total) by mouth daily for 30 days.   aspirin EC 81 MG EC tablet Take 1 tablet (81 mg total) by mouth daily.   atorvastatin (LIPITOR) 40 MG tablet Take 1 tablet (40 mg total) by mouth daily at 6 PM.   Cyanocobalamin (B-12 PO) Take 1 tablet by mouth daily.   diclofenac sodium (VOLTAREN) 1 % GEL Apply 2 g topically 4 (four) times daily.   hydrALAZINE (APRESOLINE) 25 MG tablet Take 1 tablet (25 mg total) by mouth 3 (three) times daily.   isosorbide dinitrate (ISORDIL) 10 MG tablet Take 1 tablet (10 mg total) by mouth 2 (two) times daily for 30 days.   loratadine (CLARITIN) 10  MG tablet Take 10 mg by mouth daily as needed for allergies.   metoprolol succinate (TOPROL-XL) 50 MG 24 hr tablet Take 50 mg by mouth daily. Take with or immediately following a meal.   nitroGLYCERIN (NITROSTAT) 0.4 MG SL tablet Place 1 tablet (0.4 mg total) under the tongue every 5 (five) minutes x 3 doses as needed for chest pain.   sertraline (ZOLOFT) 100 MG tablet Take 100 mg by mouth daily.   sodium bicarbonate 650 MG tablet Take 1 tablet (650 mg total) by mouth 2 (two) times daily for 30 days.   venlafaxine (EFFEXOR) 37.5 MG tablet Take 37.5 mg by mouth 2 (two) times daily.     Allergies:   Yellow jacket venom; Dilaudid [hydromorphone hcl]; Adhesive [tape]; Brintellix [vortioxetine]; Cymbalta [duloxetine hcl]; and  Penicillins   Social History   Tobacco Use   Smoking status: Former Smoker    Years: 0.00    Types: Cigarettes    Last attempt to quit: 02/14/2009    Years since quitting: 9.9   Smokeless tobacco: Never Used  Substance Use Topics   Alcohol use: No    Alcohol/week: 0.0 standard drinks   Drug use: No     Family Hx: The patient's family history includes AAA (abdominal aortic aneurysm) in her paternal grandmother; Barrett's esophagus in her paternal aunt; Breast cancer in her paternal grandmother; Cervical cancer in her mother; Colon cancer (age of onset: 84) in her paternal grandfather; Colon cancer (age of onset: 92) in her paternal grandmother; Colon polyps in her father; Lung cancer in her father; Pneumonia in her brother. There is no history of Stomach cancer or Pancreatic cancer.  ROS:   Please see the history of present illness.    All other systems reviewed and are negative.   Prior CV studies:   As above  Labs/Other Tests and Data Reviewed:    EKG:  An ECG dated 01/12/19 was personally reviewed today and demonstrated:  sinus tach with NSIVCD, cannot r/o prior anterior infarct  Recent Labs: 04/28/2018: B Natriuretic Peptide 39.0 01/11/2019: ALT 9 01/13/2019: Magnesium 2.6 01/20/2019: BUN 34; Creatinine, Ser 4.06; Platelets 142 01/23/2019: Hemoglobin 9.5; Potassium 3.5; Sodium 134   Recent Lipid Panel Lab Results  Component Value Date/Time   CHOL 229 (H) 05/19/2017 07:12 PM   TRIG 112 01/10/2019 02:34 AM   HDL 57 05/19/2017 07:12 PM   CHOLHDL 4.0 05/19/2017 07:12 PM   LDLCALC 149 (H) 05/19/2017 07:12 PM    Wt Readings from Last 3 Encounters:  01/26/19 174 lb (78.9 kg)  01/23/19 174 lb 13.2 oz (79.3 kg)  01/20/19 174 lb 13.2 oz (79.3 kg)     Objective:    Vital Signs:  BP 122/78    Pulse 77    Ht 5' 5" (1.651 m)    Wt 174 lb (78.9 kg)    LMP 08/13/2011    SpO2 95%    BMI 28.96 kg/m    General - middle aged WF in no acute distress HEENT - NCAT, EOM  intact Pulm - No labored breathing, no coughing during visit, no audible wheezing, speaking in full sentences Neuro - A+Ox3, no slurred speech, answers questions appropriately Psych - Pleasant affect   ASSESSMENT & PLAN:    1. Chronic combined CHF/cardiomyopathy - recent decline in LVEF noted as above, with adjustment in medications. She has not had any recent symptoms to suggest angina, and nuclear stress test did not show any new ischemia therefore medical therapy was recommended. Will  try to get her off amlodipine in lieu of titrating hydralazine further. She already took meds today. She has been on hydralazine 64m. Tomorrow she will no longer take amlodipine going forward, and increase hydralazine to 545mTID with new prescription for the 5043mablets. I anticipate we'll need to go up further on hydralazine to account for the dose change, sp on Day 2 if her BP is still running >130, she will increase to hydralazine 1.5 tablets (12m12mhree times a day. If BP remains elevated going forward we will likely need to increase further to 100mg60m. I asked her to send in BP readings on Monday for review. She is also getting her BP followed regularly with dialysis. If she still needs additional BP control beyond hydralazine titration, would then switch metoprolol to carvedilol. Consider echocardiogram in several months to reassess LVEF once meds are optimized. We also discussed sodium restriction. Will also send in refill for isordil as she was only given a minimal supply upon discharge. 2. CAD - continue ASA, BB, statin. No recent angina. She will let us [place] if symptoms change. She will also inquire at next HD session whether they are following her Hgb given her recent anemia requiring transfusion. This is being managed in context of CKD. 3. Essential HTN - follow with med change above. 4. Hyperlipidemia - the last time lipids were checked was before statin initiation. Recommend checking the next time she  is physically back in the office if not done by PCP by then. Do not want to expose her to community at this time if we can avoid it, given high risk category.  COVID-19 Education: The signs and symptoms of COVID-19 were discussed with the patient and how to seek care for testing (follow up with PCP or arrange E-visit).  The importance of social distancing was discussed today.  Time:   Today, I have spent 22 minutes with the patient with telehealth technology discussing the above problems.     Medication Adjustments/Labs and Tests Ordered: Current medicines are reviewed at length with the patient today.  Concerns regarding medicines are outlined above.   Tests Ordered: Disposition:  Follow up 05/2019 with Dr. McDowDomenic PoliteeidsAlmontnaCharlie PitterC  01/26/2019 9:28 AM    [facility]

## 2019-01-24 NOTE — Telephone Encounter (Signed)
Patient contacted regarding discharge from [facility] on January 20, 2019 Pt was at hospital for surgery Dr Oneida Alar 01/23/19, notes indicate this was cancelled due to emergency case Dr Oneida Alar. Pt states this has been rescheduled to Jan 30, 2019.  Patient understands to follow up Virtual Visit with Melina Copa, PA on Thursday January 26, 2019 9 AM. I confirmed pt is active in [place] and knows to expect a call from our office prior to appt to review instructions for Virtual Visit. Patient understands discharge instructions? Yes Metoprolol tartrate 25 bid (lopressor)and Metoprolol succinate XL 50 daily (Toprol XL) were on patient's discharge medication list. Hospital notes indicate pt should be taking metoprolol succinate XL 50 mg daily-I confirmed with patient this is the medication she is taking. Patient understands medications and regiment? yes Patient understands to have all medications to this visit? Yes

## 2019-01-25 ENCOUNTER — Telehealth: Payer: Self-pay | Admitting: Physician Assistant

## 2019-01-25 NOTE — Telephone Encounter (Signed)
Patient set up for MyChart?  YES - sent consent through my chart , patient agreed to having virtual visit Is patient using Smartphone/computer/tablet? smartphone  Did audio/video work?  Does patient need telephone visit? No   Best phone number to use? [phone]  Special Instructions? Pt does not have scale , she does have bp machine and will have medication list ready     Virtual Visit Pre-Appointment Phone Call  "(Name), I am calling you today to discuss your upcoming appointment. We are currently trying to limit exposure to the virus that causes COVID-19 by seeing patients at home rather than in the office."  1. "What is the BEST phone number to call the day of the visit?" - include this in appointment notes  2. Do you have or have access to (through a family member/friend) a smartphone with video capability that we can use for your visit?" a. If yes - list this number in appt notes as cell (if different from BEST phone #) and list the appointment type as a VIDEO visit in appointment notes b. If no - list the appointment type as a PHONE visit in appointment notes  3. Confirm consent - "In the setting of the current Covid19 crisis, you are scheduled for a (phone or video) visit with your provider on (date) at (time).  Just as we do with many in-office visits, in order for you to participate in this visit, we must obtain consent.  If you'd like, I can send this to your mychart (if signed up) or email for you to review.  Otherwise, I can obtain your verbal consent now.  All virtual visits are billed to your insurance company just like a normal visit would be.  By agreeing to a virtual visit, we'd like you to understand that the technology does not allow for your provider to perform an examination, and thus may limit your provider's ability to fully assess your condition. If your provider identifies any concerns that need to be evaluated in person, we will make arrangements to do so.   Finally, though the technology is pretty good, we cannot assure that it will always work on either your or our end, and in the setting of a video visit, we may have to convert it to a phone-only visit.  In either situation, we cannot ensure that we have a secure connection.  Are you willing to proceed?" STAFF: Did the patient verbally acknowledge consent to telehealth visit? Document YES/NO here: yes  4. Advise patient to be prepared - "Two hours prior to your appointment, go ahead and check your blood pressure, pulse, oxygen saturation, and your weight (if you have the equipment to check those) and write them all down. When your visit starts, your provider will ask you for this information. If you have an Apple Watch or Kardia device, please plan to have heart rate information ready on the day of your appointment. Please have a pen and paper handy nearby the day of the visit as well."  5. Give patient instructions for MyChart download to smartphone OR [email or web address] as below if video visit (depending on what platform provider is using)  6. Inform patient they will receive a phone call 15 minutes prior to their appointment time (may be from unknown caller ID) so they should be prepared to answer    TELEPHONE CALL NOTE  MOANI WEIPERT has been deemed a candidate for a follow-up tele-health visit to limit community exposure during the Covid-19 pandemic. I  spoke with the patient via phone to ensure availability of phone/video source, confirm preferred email & phone number, and discuss instructions and expectations.  I reminded Summer Cook to be prepared with any vital sign and/or heart rhythm information that could potentially be obtained via home monitoring, at the time of her visit. I reminded SHARLOTTE BAKA to expect a phone call prior to her visit.  Howie Ill 01/25/2019 8:51 AM   INSTRUCTIONS FOR DOWNLOADING THE MYCHART APP TO SMARTPHONE  - The patient must first make sure to have  activated MyChart and know their login information - If Apple, go to [employer] and type in MyChart in the search bar and download the app. If Android, ask patient to go to [employer] and type in [place] in the search bar and download the app. The app is free but as with any other app downloads, their phone may require them to verify saved payment information or Apple/Android password.  - The patient will need to then log into the app with their MyChart username and password, and select [facility] as their healthcare provider to link the account. When it is time for your visit, go to the MyChart app, find appointments, and click Begin Video Visit. Be sure to Select Allow for your device to access the Microphone and Camera for your visit. You will then be connected, and your provider will be with you shortly.  **If they have any issues connecting, or need assistance please contact MyChart service desk [phone])**  **If using a computer, in order to ensure the best quality for their visit they will need to use either of the following Internet Browsers: [employer], or Google Chrome**  IF USING DOXIMITY or DOXY.ME - The patient will receive a link just prior to their visit by text.     FULL LENGTH CONSENT FOR TELE-HEALTH VISIT   I hereby voluntarily request, consent and authorize [facility] and its employed or contracted physicians, physician assistants, nurse practitioners or other licensed health care professionals (the Practitioner), to provide me with telemedicine health care services (the Services") as deemed necessary by the treating Practitioner. I acknowledge and consent to receive the Services by the Practitioner via telemedicine. I understand that the telemedicine visit will involve communicating with the Practitioner through live audiovisual communication technology and the disclosure of certain medical information by electronic transmission. I acknowledge that  I have been given the opportunity to request an in-person assessment or other available alternative prior to the telemedicine visit and am voluntarily participating in the telemedicine visit.  I understand that I have the right to withhold or withdraw my consent to the use of telemedicine in the course of my care at any time, without affecting my right to future care or treatment, and that the Practitioner or I may terminate the telemedicine visit at any time. I understand that I have the right to inspect all information obtained and/or recorded in the course of the telemedicine visit and may receive copies of available information for a reasonable fee.  I understand that some of the potential risks of receiving the Services via telemedicine include:   Delay or interruption in medical evaluation due to technological equipment failure or disruption;  Information transmitted may not be sufficient (e.g. poor resolution of images) to allow for appropriate medical decision making by the Practitioner; and/or   In rare instances, security protocols could fail, causing a breach of personal health information.  Furthermore, I acknowledge  that it is my responsibility to provide information about my medical history, conditions and care that is complete and accurate to the best of my ability. I acknowledge that Practitioner's advice, recommendations, and/or decision may be based on factors not within their control, such as incomplete or inaccurate data provided by me or distortions of diagnostic images or specimens that may result from electronic transmissions. I understand that the practice of medicine is not an exact science and that Practitioner makes no warranties or guarantees regarding treatment outcomes. I acknowledge that I will receive a copy of this consent concurrently upon execution via email to the email address I last provided but may also request a printed copy by calling the office of [place].      I understand that my insurance will be billed for this visit.   I have read or had this consent read to me.  I understand the contents of this consent, which adequately explains the benefits and risks of the Services being provided via telemedicine.   I have been provided ample opportunity to ask questions regarding this consent and the Services and have had my questions answered to my satisfaction.  I give my informed consent for the services to be provided through the use of telemedicine in my medical care  By participating in this telemedicine visit I agree to the above.

## 2019-01-26 ENCOUNTER — Encounter: Payer: Self-pay | Admitting: Physician Assistant

## 2019-01-26 ENCOUNTER — Other Ambulatory Visit: Payer: Self-pay

## 2019-01-26 ENCOUNTER — Telehealth (INDEPENDENT_AMBULATORY_CARE_PROVIDER_SITE_OTHER): Payer: Medicare Other | Admitting: Physician Assistant

## 2019-01-26 VITALS — BP 122/78 | HR 77 | Ht 65.0 in | Wt 174.0 lb

## 2019-01-26 DIAGNOSIS — N170 Acute kidney failure with tubular necrosis: Secondary | ICD-10-CM | POA: Diagnosis not present

## 2019-01-26 DIAGNOSIS — D509 Iron deficiency anemia, unspecified: Secondary | ICD-10-CM | POA: Diagnosis not present

## 2019-01-26 DIAGNOSIS — I2510 Atherosclerotic heart disease of native coronary artery without angina pectoris: Secondary | ICD-10-CM

## 2019-01-26 DIAGNOSIS — D649 Anemia, unspecified: Secondary | ICD-10-CM | POA: Diagnosis not present

## 2019-01-26 DIAGNOSIS — E785 Hyperlipidemia, unspecified: Secondary | ICD-10-CM

## 2019-01-26 DIAGNOSIS — N2581 Secondary hyperparathyroidism of renal origin: Secondary | ICD-10-CM | POA: Diagnosis not present

## 2019-01-26 DIAGNOSIS — I5042 Chronic combined systolic (congestive) and diastolic (congestive) heart failure: Secondary | ICD-10-CM | POA: Diagnosis not present

## 2019-01-26 DIAGNOSIS — N179 Acute kidney failure, unspecified: Secondary | ICD-10-CM | POA: Diagnosis not present

## 2019-01-26 DIAGNOSIS — I10 Essential (primary) hypertension: Secondary | ICD-10-CM

## 2019-01-26 MED ORDER — ISOSORBIDE DINITRATE 10 MG PO TABS: 10.0000 mg | ORAL_TABLET | Freq: Two times a day (BID) | ORAL | 3 refills | Status: DC

## 2019-01-26 MED ORDER — ISOSORBIDE DINITRATE 10 MG PO TABS: 10.0000 mg | ORAL_TABLET | Freq: Two times a day (BID) | ORAL | 3 refills | Status: AC

## 2019-01-26 MED ORDER — HYDRALAZINE HCL 50 MG PO TABS: 50.0000 mg | ORAL_TABLET | Freq: Three times a day (TID) | ORAL | 3 refills | Status: DC

## 2019-01-26 NOTE — Patient Instructions (Addendum)
Medication Instructions:  Your physician has recommended you make the following change in your medication:  1.  STOP Amlodipine 2.  INCREASE the Hydralazine to 50 mg taking 1 tablet three times a day... see instructions:  On Day 1 of not taking amlodipine any more: Start hydralazine 1 tablet (50mg ) three times a day  On Day 2: If blood pressure is running above 130 on the top number, increase to 1.5 tablets (75mg ) three times a day   Continue to follow blood pressure over the weekend and send in readings on Monday via MyChart for review. Ultimately if blood pressure runs higher than 130, we may go up to 2 tablets (100mg ) three times a day. It can take a few adjustments before we fully optimize your regimen to help your heart heal.  If you need a refill on your cardiac medications before your next appointment, please call your pharmacy.   Lab work: None ordered  If you have labs (blood work) drawn today and your tests are completely normal, you will receive your results only by: Marland Kitchen MyChart Message (if you have MyChart) OR . A paper copy in the mail If you have any lab test that is abnormal or we need to change your treatment, we will call you to review the results.  Testing/Procedures: None ordered  Follow-Up: At [facility], you and your health needs are our priority.  As part of our continuing mission to provide you with exceptional heart care, we have created designated Provider Care Teams.  These Care Teams include your primary Cardiologist (physician) and Advanced Practice Providers (APPs -  Physician Assistants and Nurse Practitioners) who all work together to provide you with the care you need, when you need it. You will need a follow up appointment in 5 months.  Please call our office 2 months in advance to schedule this appointment.  You may see Rozann Lesches, MD or one of the following Advanced Practice Providers on your designated Care Team:    Any Other Special Instructions  Will Be Listed Below (If Applicable). N/A

## 2019-01-27 ENCOUNTER — Encounter (HOSPITAL_COMMUNITY): Payer: Self-pay | Admitting: *Deleted

## 2019-01-27 ENCOUNTER — Other Ambulatory Visit: Payer: Self-pay

## 2019-01-27 NOTE — Anesthesia Preprocedure Evaluation (Addendum)
Anesthesia Evaluation  Patient identified by MRN, date of birth, ID band Patient awake    Reviewed: Allergy & Precautions, NPO status , Patient's Chart, lab work & pertinent test results, reviewed documented beta blocker date and time   History of Anesthesia Complications Negative for: history of anesthetic complications  Airway Mallampati: II  TM Distance: >3 FB Neck ROM: Full    Dental  (+) Dental Advisory Given   Pulmonary COPD,  COPD inhaler, former smoker,    breath sounds clear to auscultation       Cardiovascular hypertension, Pt. on medications and Pt. on home beta blockers (-) angina+ CAD ('18 cath: single vessel PDA 100%), + Past MI and +CHF (12/2018 EF 35%)   Rhythm:Regular Rate:Normal  12/2018 ECHO: EF 35-40%, diffuse LV hypokinesis, mod MR 12/2018 stress: Small fixed defect anterior apical wall, no ischemia, EF 37%   Neuro/Psych Anxiety Depression CVA, No Residual Symptoms    GI/Hepatic negative GI ROS, Neg liver ROS, neg GERD  ,  Endo/Other  diabetes  Renal/GU Dialysis and ESRFRenal disease (K+ 3.6)     Musculoskeletal   Abdominal   Peds  Hematology  (+) Blood dyscrasia (Hb 9.5), anemia ,   Anesthesia Other Findings   Reproductive/Obstetrics                          Anesthesia Physical Anesthesia Plan  ASA: III  Anesthesia Plan: MAC   Post-op Pain Management:    Induction:   PONV Risk Score and Plan: 2 and Ondansetron  Airway Management Planned: Natural Airway and Simple Face Mask  Additional Equipment:   Intra-op Plan:   Post-operative Plan:   Informed Consent: I have reviewed the patients History and Physical, chart, labs and discussed the procedure including the risks, benefits and alternatives for the proposed anesthesia with the patient or authorized representative who has indicated his/her understanding and acceptance.     Dental advisory given  Plan  Discussed with: CRNA and Surgeon  Anesthesia Plan Comments: (See PAT note by Karoline Caldwell, PA-C )      Anesthesia Quick Evaluation

## 2019-01-27 NOTE — Progress Notes (Signed)
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  DISCUSSION: 52 yo female former smoker. Pertinent hx includes  CAD s/p NSTEMI with totally occluded dPDA in 2018 (medically managed), HTN, HLD,  ITP s/p slpenectomy, steroid induced DM, stroke 2017, prior PE 2015, recent AKI on CKD progressing to dialysis, perinephric hematoma, iron deficiency anemia and acute combined CHF.  Recently admitted to the hospital from 12/30/2018-01/04/2019 after presenting with persistent N/V and dysuria. She was found to have acute on chronic renal insufficiency concerning for AIN vs GN. She underwent a renal biopsy 01/03/2019 and was discharged home the following day. Her HTN management was adjusted given hypertensive urgency on admission with improvement in BPs. After discharge, she developed a cough and fevers prompting her to present back to the ED for further evaluation on 01/06/2019 and admitted 4/10-4/24/20. CT C/A/P on admission revealed pulmonary edema, patchy bilateral ground-glass opacities in both lungs (infectious vs inflammatory) concerning for COVID-19, and a moderate-sized left extrarenal perinephric hematoma. 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 Unfortunately her renal function continued to decline and the patient became progressively SOB 2/2 volume overload (COVID-19 testing was negative). Trial of high does IV Lasix was unsuccessful and ultimately patient had a tunneled catheter placed and was started on dialysis.  Echocardiogram 01/10/2019 revealed EF 35-40%, G2DD, diffuse LV hypokinesis, moderate MR, and mild pulmonary HTN. Cardiology was asked to evaluate patient given acute combined CHF. She underwent Lexiscan nuclear stress test 01/12/19 showing small area of irreversible defect in the apical anterior wall consistent with prior infarct and no ischemia, LVEF 37% with diffuse hypokinesis. Isordil and hydralazine were added with recommendation to stop amlodipine although this was still on her med list. No further cardiac testing was recommended.  She had telehealth followup visit with cardiology 01/26/19. Per note, she was doing well from cardiac standpoint and BP was well controlled.   Will need DOS eval by assigned anesthesiologist. Anticipate she can proceed as planned barring acute status change and DOS labs acceptable.   VS: LMP 08/13/2011   PROVIDERS: Redmond School, MD is PCP  Rozann Lesches, MD is Cardiologist  [name], MD is Nephrologist  LABS: Will need DOS labs.  Labs Reviewed - No data to display   IMAGES: CT Abd/pelvis 01/14/19: IMPRESSION: 1. Stable perinephric hematoma deep to the LEFT kidney. No evidence of renal infection on noncontrast exam. 2. Descending colon has a short segment entering a LEFT lateral wall hernia. No evidence obstruction. 3. Bilateral moderate size pleural effusions with basilar passive atelectasis. Mild increase in effusion volume.  EKG: 01/12/2019: Sinus tachycardia. Rate 101. Non-specific intra-ventricular conduction block. Cannot rule out Anteroseptal infarct , age undetermined  CV: Nuclear stress 01/12/19:  Defect 1: There is a small defect present in the apical anterior location.  Findings consistent with prior myocardial infarction.  This is an intermediate risk study.  The left ventricular ejection fraction is moderately decreased (30-44%).   There is a small area of irreversible defect in the apical anterior  wall consistent with  prior infarct and no ischemia. LVEF 37% with diffuse hypokinesis.   TTE 01/10/19: IMPRESSIONS  1. The left ventricle has moderately reduced systolic function, with an ejection fraction of 35-40%. The cavity size was mildly dilated. Left ventricular diastolic Doppler parameters are consistent with pseudonormalization. Left ventricular diffuse  hypokinesis.  2. The right ventricle has normal systolic function. The cavity was normal. Right ventricular systolic pressure is mildly elevated with an estimated pressure of 34.4 mmHg.  3. The mitral valve is grossly normal. Mild thickening of the mitral valve leaflet. Mitral valve regurgitation is moderate by color flow Doppler.  4. The tricuspid valve is grossly normal.  5. The aortic valve is tricuspid. Mild thickening of the aortic valve. Aortic valve regurgitation is trivial by color flow Doppler.  6. The inferior vena cava was dilated in size with <50% respiratory variability.  7. Moderate LV dysfunction; mild LVE; moderate diastolic dysfunction; moderate MR; mild TR; mild pulmonary hypertension.  LHC 05/20/17:  RPDA lesion, 100 %stenosed.  The left ventricular systolic function is normal.  LV end diastolic pressure is normal.  The left ventricular ejection fraction is 55-65% by visual estimate.   1. 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 Otherwise normal coronary arteries. 3. Normal LV function with very small focal area of distal inferior hypokinesis.  Plan: medical therapy.    Past Medical History:  Diagnosis Date  . Allergy   . Anxiety   . Asthma   . CAD (coronary artery disease), native coronary artery    a. cath 05/20/17 - 100% distal PDA --> medical mangement (no intervention done)  . Candida esophagitis ([place])   . Chronic combined systolic and diastolic CHF (congestive heart failure) ([place])   . Depression   . End stage renal disease on dialysis [facility])    T/ Th/ Sat starting 4/28- [facility]  .  Essential hypertension, benign   . GERD (gastroesophageal reflux disease)   . Gout   . Hemorrhoids   . 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  Last was Prednisone '40mg'$ ; 2 wk in 10/2012.  She also was on Prednisone bridged with Cellcept briefly but stopped due to lack of insurance.   . Myocardial infarction ([place])   . Perinephric hematoma   . Pulmonary embolism ([place]) 04/2012  . Serrated adenoma of colon   . Steroid-induced diabetes ([place])   . Stroke ([place])   . Thrush, oral 05/29/2011    Past Surgical History:  Procedure Laterality Date  . AV FISTULA PLACEMENT Right 01/23/2019   Procedure: ARTERIOVENOUS (AV) FISTULA CREATION RIGHT ARM;  Surgeon: Elam Dutch, MD;  Location: [facility];  Service: Vascular;  Laterality: Right;  . BONE MARROW BIOPSY    . CARDIAC CATHETERIZATION    . CARPAL TUNNEL RELEASE    . CARPAL TUNNEL RELEASE Left 05/20/2016   Procedure: CARPAL TUNNEL RELEASE;  Surgeon: Carole Civil, MD;  Location: [facility];  Service: Orthopedics;  Laterality: Left;  . CARPAL TUNNEL RELEASE Right 07/16/2016   Procedure: RIGHT CARPAL TUNNEL RELEASE;  Surgeon: Carole Civil, MD;  Location: [facility];  Service: Orthopedics;  Laterality: Right;  . CATARACT EXTRACTION    . CHOLECYSTECTOMY  2008  . COLONOSCOPY WITH ESOPHAGOGASTRODUODENOSCOPY (EGD) N/A 01/04/2013   Dr. Gala Romney: esophageal plaques with +KOH, hh. Gastric antrum abnormal, bx reactive gastropathy. Anal canal hemorrhoids, colonic tics, normal TI, single polyp (sessile serrated tubular adenoma).  Next TCS 12/2017  . IR FLUORO GUIDE CV LINE RIGHT  01/10/2019  . IR US GUIDE VASC ACCESS RIGHT  01/10/2019  . LEFT HEART CATH AND CORONARY ANGIOGRAPHY N/A 05/20/2017   Procedure: LEFT HEART CATH AND CORONARY ANGIOGRAPHY;  Surgeon: Martinique, Peter M, MD;  Location: [facility] LAB;  Service: Cardiovascular;  Laterality: N/A;  .  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 TEE WITHOUT CARDIOVERSION N/A 01/03/2016   Procedure: TRANSESOPHAGEAL ECHOCARDIOGRAM (TEE) WITH PROPOFOL;  Surgeon: Satira Sark, MD;  Location[facility];  Service: Cardiovascular;  Laterality: N/A;    MEDICATIONS: No current facility-administered medications for this encounter.    Marland Kitchen acetaminophen (TYLENOL) 500 MG tablet  . albuterol (PROVENTIL) (2.5 MG/3ML) 0.083% nebulizer solution  . ALPRAZolam (XANAX) 0.25 MG tablet  . aspirin EC 81 MG EC tablet  . atorvastatin (LIPITOR) 40 MG tablet  . Cyanocobalamin (B-12 PO)  . diclofenac sodium (VOLTAREN) 1 % GEL  . loratadine (CLARITIN) 10 MG tablet  . metoprolol succinate (TOPROL-XL) 50 MG 24 hr tablet  . nitroGLYCERIN (NITROSTAT) 0.4 MG SL tablet  . sertraline (ZOLOFT) 100 MG tablet  . sodium bicarbonate 650 MG tablet  . venlafaxine (EFFEXOR) 37.5 MG tablet  . hydrALAZINE (APRESOLINE) 50 MG tablet  . isosorbide dinitrate (ISORDIL) 10 MG tablet    Wynonia Musty [facility]/Anesthesiology Phone [phone] 01/27/2019 12:11 PM

## 2019-01-27 NOTE — Progress Notes (Signed)
Pt denies SOB and chest pain. Pt stated that she is under the care of Dr. Domenic Polite, Cardiology. Pt made aware to stop taking vitamins, fish oil and herbal medications. Do not take any NSAIDs ie: Ibuprofen, Advil, Naproxen (Aleve), Motrin, Voltaren, BC and Goody Powder. Pt stated that she does not take medication for her diabetes and does not check her blood glucose.  Pt denies that she and members of her family tested positive for COVID-19.   Coronavirus Screening Pt denies that she and members of her family experienced the following symptoms:  Cough yes/no: No Fever (>100.22F)  yes/no: No Runny nose yes/no: No Sore throat yes/no: No Difficulty breathing/shortness of breath  yes/no: No  Have you or a family member traveled in the last 14 days and where? yes/no: No   Pt reminded that hospital visitation restrictions are in effect and the importance of the restrictions.  Pt verbalized understanding of all pre-op instructions. PA, Anesthesiology, asked to review pt cardiac history.

## 2019-01-28 DIAGNOSIS — Z23 Encounter for immunization: Secondary | ICD-10-CM | POA: Diagnosis not present

## 2019-01-28 DIAGNOSIS — N170 Acute kidney failure with tubular necrosis: Secondary | ICD-10-CM | POA: Diagnosis not present

## 2019-01-28 DIAGNOSIS — E876 Hypokalemia: Secondary | ICD-10-CM | POA: Diagnosis not present

## 2019-01-28 DIAGNOSIS — N2581 Secondary hyperparathyroidism of renal origin: Secondary | ICD-10-CM | POA: Diagnosis not present

## 2019-01-28 DIAGNOSIS — D509 Iron deficiency anemia, unspecified: Secondary | ICD-10-CM | POA: Diagnosis not present

## 2019-01-28 DIAGNOSIS — D649 Anemia, unspecified: Secondary | ICD-10-CM | POA: Diagnosis not present

## 2019-01-28 DIAGNOSIS — D689 Coagulation defect, unspecified: Secondary | ICD-10-CM | POA: Diagnosis not present

## 2019-01-30 ENCOUNTER — Ambulatory Visit (HOSPITAL_COMMUNITY): Payer: Medicare Other | Admitting: Physician Assistant

## 2019-01-30 ENCOUNTER — Other Ambulatory Visit: Payer: Self-pay

## 2019-01-30 ENCOUNTER — Ambulatory Visit (HOSPITAL_COMMUNITY)
Admission: RE | Admit: 2019-01-30 | Discharge: 2019-01-30 | Disposition: A | Discharge status: Home / Self Care | Payer: Medicare Other | Attending: Vascular Surgery | Admitting: Vascular Surgery

## 2019-01-30 ENCOUNTER — Encounter (HOSPITAL_COMMUNITY)
Admission: RE | Disposition: A | Discharge status: Home / Self Care | Payer: Self-pay | Source: Home / Self Care | Attending: Vascular Surgery

## 2019-01-30 ENCOUNTER — Encounter (HOSPITAL_COMMUNITY): Payer: Self-pay | Admitting: *Deleted

## 2019-01-30 DIAGNOSIS — J449 Chronic obstructive pulmonary disease, unspecified: Secondary | ICD-10-CM | POA: Diagnosis not present

## 2019-01-30 DIAGNOSIS — Z888 Allergy status to other drugs, medicaments and biological substances status: Secondary | ICD-10-CM | POA: Insufficient documentation

## 2019-01-30 DIAGNOSIS — D631 Anemia in chronic kidney disease: Secondary | ICD-10-CM | POA: Insufficient documentation

## 2019-01-30 DIAGNOSIS — Z87891 Personal history of nicotine dependence: Secondary | ICD-10-CM | POA: Insufficient documentation

## 2019-01-30 DIAGNOSIS — N186 End stage renal disease: Secondary | ICD-10-CM | POA: Insufficient documentation

## 2019-01-30 DIAGNOSIS — M109 Gout, unspecified: Secondary | ICD-10-CM | POA: Diagnosis not present

## 2019-01-30 DIAGNOSIS — J90 Pleural effusion, not elsewhere classified: Secondary | ICD-10-CM | POA: Insufficient documentation

## 2019-01-30 DIAGNOSIS — Z8249 Family history of ischemic heart disease and other diseases of the circulatory system: Secondary | ICD-10-CM | POA: Insufficient documentation

## 2019-01-30 DIAGNOSIS — Z8673 Personal history of transient ischemic attack (TIA), and cerebral infarction without residual deficits: Secondary | ICD-10-CM | POA: Diagnosis not present

## 2019-01-30 DIAGNOSIS — Z880 Allergy status to penicillin: Secondary | ICD-10-CM | POA: Insufficient documentation

## 2019-01-30 DIAGNOSIS — I130 Hypertensive heart and chronic kidney disease with heart failure and stage 1 through stage 4 chronic kidney disease, or unspecified chronic kidney disease: Secondary | ICD-10-CM | POA: Diagnosis not present

## 2019-01-30 DIAGNOSIS — I272 Pulmonary hypertension, unspecified: Secondary | ICD-10-CM | POA: Diagnosis not present

## 2019-01-30 DIAGNOSIS — I5023 Acute on chronic systolic (congestive) heart failure: Secondary | ICD-10-CM | POA: Diagnosis not present

## 2019-01-30 DIAGNOSIS — Z86711 Personal history of pulmonary embolism: Secondary | ICD-10-CM | POA: Diagnosis not present

## 2019-01-30 DIAGNOSIS — Z79899 Other long term (current) drug therapy: Secondary | ICD-10-CM | POA: Insufficient documentation

## 2019-01-30 DIAGNOSIS — Z91030 Bee allergy status: Secondary | ICD-10-CM | POA: Insufficient documentation

## 2019-01-30 DIAGNOSIS — I252 Old myocardial infarction: Secondary | ICD-10-CM | POA: Insufficient documentation

## 2019-01-30 DIAGNOSIS — Z7951 Long term (current) use of inhaled steroids: Secondary | ICD-10-CM | POA: Diagnosis not present

## 2019-01-30 DIAGNOSIS — Z9849 Cataract extraction status, unspecified eye: Secondary | ICD-10-CM | POA: Insufficient documentation

## 2019-01-30 DIAGNOSIS — I083 Combined rheumatic disorders of mitral, aortic and tricuspid valves: Secondary | ICD-10-CM | POA: Insufficient documentation

## 2019-01-30 DIAGNOSIS — Z800 Family history of malignant neoplasm of digestive organs: Secondary | ICD-10-CM | POA: Insufficient documentation

## 2019-01-30 DIAGNOSIS — F329 Major depressive disorder, single episode, unspecified: Secondary | ICD-10-CM | POA: Diagnosis not present

## 2019-01-30 DIAGNOSIS — J45909 Unspecified asthma, uncomplicated: Secondary | ICD-10-CM | POA: Insufficient documentation

## 2019-01-30 DIAGNOSIS — K219 Gastro-esophageal reflux disease without esophagitis: Secondary | ICD-10-CM | POA: Diagnosis not present

## 2019-01-30 DIAGNOSIS — F419 Anxiety disorder, unspecified: Secondary | ICD-10-CM | POA: Insufficient documentation

## 2019-01-30 DIAGNOSIS — Z992 Dependence on renal dialysis: Secondary | ICD-10-CM | POA: Insufficient documentation

## 2019-01-30 DIAGNOSIS — I2510 Atherosclerotic heart disease of native coronary artery without angina pectoris: Secondary | ICD-10-CM | POA: Diagnosis not present

## 2019-01-30 DIAGNOSIS — Z9049 Acquired absence of other specified parts of digestive tract: Secondary | ICD-10-CM | POA: Insufficient documentation

## 2019-01-30 DIAGNOSIS — E1122 Type 2 diabetes mellitus with diabetic chronic kidney disease: Secondary | ICD-10-CM | POA: Insufficient documentation

## 2019-01-30 DIAGNOSIS — Z91048 Other nonmedicinal substance allergy status: Secondary | ICD-10-CM | POA: Insufficient documentation

## 2019-01-30 DIAGNOSIS — Z7982 Long term (current) use of aspirin: Secondary | ICD-10-CM | POA: Diagnosis not present

## 2019-01-30 DIAGNOSIS — I132 Hypertensive heart and chronic kidney disease with heart failure and with stage 5 chronic kidney disease, or end stage renal disease: Secondary | ICD-10-CM | POA: Insufficient documentation

## 2019-01-30 DIAGNOSIS — Z791 Long term (current) use of non-steroidal anti-inflammatories (NSAID): Secondary | ICD-10-CM | POA: Diagnosis not present

## 2019-01-30 DIAGNOSIS — Z836 Family history of other diseases of the respiratory system: Secondary | ICD-10-CM | POA: Insufficient documentation

## 2019-01-30 DIAGNOSIS — Z803 Family history of malignant neoplasm of breast: Secondary | ICD-10-CM | POA: Insufficient documentation

## 2019-01-30 DIAGNOSIS — Z8379 Family history of other diseases of the digestive system: Secondary | ICD-10-CM | POA: Insufficient documentation

## 2019-01-30 DIAGNOSIS — I5042 Chronic combined systolic (congestive) and diastolic (congestive) heart failure: Secondary | ICD-10-CM | POA: Insufficient documentation

## 2019-01-30 DIAGNOSIS — Z8371 Family history of colonic polyps: Secondary | ICD-10-CM | POA: Insufficient documentation

## 2019-01-30 DIAGNOSIS — N185 Chronic kidney disease, stage 5: Secondary | ICD-10-CM | POA: Diagnosis not present

## 2019-01-30 HISTORY — DX: Headache: R51

## 2019-01-30 HISTORY — PX: AV FISTULA PLACEMENT: SHX1204

## 2019-01-30 HISTORY — DX: Pneumonia, unspecified organism: J18.9

## 2019-01-30 HISTORY — DX: Headache, unspecified: R51.9

## 2019-01-30 LAB — POCT I-STAT 4, (NA,K, GLUC, HGB,HCT)
Glucose, Bld: 85 mg/dL (ref 70–99)
HCT: 28 % — ABNORMAL LOW (ref 36.0–46.0)
Hemoglobin: 9.5 g/dL — ABNORMAL LOW (ref 12.0–15.0)
Potassium: 3.6 mmol/L (ref 3.5–5.1)
Sodium: 140 mmol/L (ref 135–145)

## 2019-01-30 LAB — GLUCOSE, CAPILLARY: Glucose-Capillary: 84 mg/dL (ref 70–99)

## 2019-01-30 SURGERY — ARTERIOVENOUS (AV) FISTULA CREATION
Anesthesia: Monitor Anesthesia Care | Site: Arm Upper | Laterality: Right

## 2019-01-30 MED ORDER — 0.9 % SODIUM CHLORIDE (POUR BTL) OPTIME
TOPICAL | Status: DC | PRN
  Administered 2019-01-30: 1000 mL

## 2019-01-30 MED ORDER — MIDAZOLAM HCL 5 MG/5ML IJ SOLN
INTRAMUSCULAR | Status: DC | PRN
  Administered 2019-01-30: 2 mg via INTRAVENOUS

## 2019-01-30 MED ORDER — SODIUM CHLORIDE 0.9 % IV SOLN
INTRAVENOUS | Status: AC
  Filled 2019-01-30: qty 1.2

## 2019-01-30 MED ORDER — ONDANSETRON HCL 4 MG/2ML IJ SOLN
INTRAMUSCULAR | Status: AC
  Filled 2019-01-30: qty 2

## 2019-01-30 MED ORDER — PROMETHAZINE HCL 25 MG/ML IJ SOLN: 6.2500 mg | INTRAMUSCULAR | Status: DC | PRN

## 2019-01-30 MED ORDER — PROTAMINE SULFATE 10 MG/ML IV SOLN
INTRAVENOUS | Status: DC | PRN
  Administered 2019-01-30: 50 mg via INTRAVENOUS

## 2019-01-30 MED ORDER — MEPERIDINE HCL 25 MG/ML IJ SOLN: 6.2500 mg | INTRAMUSCULAR | Status: DC | PRN

## 2019-01-30 MED ORDER — PHENYLEPHRINE 40 MCG/ML (10ML) SYRINGE FOR IV PUSH (FOR BLOOD PRESSURE SUPPORT)
PREFILLED_SYRINGE | INTRAVENOUS | Status: DC | PRN
  Administered 2019-01-30 (×2): 80 ug via INTRAVENOUS

## 2019-01-30 MED ORDER — HEPARIN SODIUM (PORCINE) 1000 UNIT/ML IJ SOLN
INTRAMUSCULAR | Status: AC
  Filled 2019-01-30: qty 1

## 2019-01-30 MED ORDER — ONDANSETRON HCL 4 MG/2ML IJ SOLN
INTRAMUSCULAR | Status: DC | PRN
  Administered 2019-01-30: 4 mg via INTRAVENOUS

## 2019-01-30 MED ORDER — PROTAMINE SULFATE 10 MG/ML IV SOLN
INTRAVENOUS | Status: AC
  Filled 2019-01-30: qty 5

## 2019-01-30 MED ORDER — PHENYLEPHRINE 40 MCG/ML (10ML) SYRINGE FOR IV PUSH (FOR BLOOD PRESSURE SUPPORT)
PREFILLED_SYRINGE | INTRAVENOUS | Status: AC
  Filled 2019-01-30: qty 10

## 2019-01-30 MED ORDER — LIDOCAINE HCL (PF) 1 % IJ SOLN
INTRAMUSCULAR | Status: DC | PRN
  Administered 2019-01-30: 60 mL

## 2019-01-30 MED ORDER — CHLORHEXIDINE GLUCONATE 4 % EX LIQD: 60.0000 mL | Freq: Once | CUTANEOUS | Status: DC

## 2019-01-30 MED ORDER — PROPOFOL 10 MG/ML IV BOLUS
INTRAVENOUS | Status: AC
  Filled 2019-01-30: qty 20

## 2019-01-30 MED ORDER — LIDOCAINE HCL (PF) 1 % IJ SOLN
INTRAMUSCULAR | Status: AC
  Filled 2019-01-30: qty 30

## 2019-01-30 MED ORDER — MIDAZOLAM HCL 2 MG/2ML IJ SOLN
INTRAMUSCULAR | Status: AC
  Filled 2019-01-30: qty 2

## 2019-01-30 MED ORDER — FENTANYL CITRATE (PF) 100 MCG/2ML IJ SOLN
INTRAMUSCULAR | Status: DC | PRN
  Administered 2019-01-30 (×2): 25 ug via INTRAVENOUS

## 2019-01-30 MED ORDER — PROPOFOL 10 MG/ML IV BOLUS
INTRAVENOUS | Status: DC | PRN
  Administered 2019-01-30: 20 mg via INTRAVENOUS

## 2019-01-30 MED ORDER — SODIUM CHLORIDE 0.9 % IV SOLN
INTRAVENOUS | Status: DC
  Administered 2019-01-30: 07:00:00 via INTRAVENOUS

## 2019-01-30 MED ORDER — HYDROCODONE-ACETAMINOPHEN 5-325 MG PO TABS: 1.0000 | ORAL_TABLET | Freq: Four times a day (QID) | ORAL | 0 refills | Status: DC | PRN

## 2019-01-30 MED ORDER — FENTANYL CITRATE (PF) 100 MCG/2ML IJ SOLN: 25.0000 ug | INTRAMUSCULAR | Status: DC | PRN

## 2019-01-30 MED ORDER — PROPOFOL 500 MG/50ML IV EMUL
INTRAVENOUS | Status: DC | PRN
  Administered 2019-01-30: 75 ug/kg/min via INTRAVENOUS

## 2019-01-30 MED ORDER — LIDOCAINE HCL (PF) 1 % IJ SOLN
INTRAMUSCULAR | Status: AC
  Filled 2019-01-30: qty 60

## 2019-01-30 MED ORDER — HEPARIN SODIUM (PORCINE) 1000 UNIT/ML IJ SOLN
INTRAMUSCULAR | Status: DC | PRN
  Administered 2019-01-30: 5000 [IU] via INTRAVENOUS

## 2019-01-30 MED ORDER — VANCOMYCIN HCL IN DEXTROSE 1-5 GM/200ML-% IV SOLN
1000.0000 mg | INTRAVENOUS | Status: AC
  Administered 2019-01-30: 1000 mg via INTRAVENOUS
  Filled 2019-01-30: qty 200

## 2019-01-30 MED ORDER — MIDAZOLAM HCL 2 MG/2ML IJ SOLN: 0.5000 mg | Freq: Once | INTRAMUSCULAR | Status: DC | PRN

## 2019-01-30 MED ORDER — SODIUM CHLORIDE 0.9 % IV SOLN
INTRAVENOUS | Status: DC | PRN
  Administered 2019-01-30: 500 mL

## 2019-01-30 MED ORDER — LIDOCAINE 2% (20 MG/ML) 5 ML SYRINGE
INTRAMUSCULAR | Status: DC | PRN
  Administered 2019-01-30: 50 mg via INTRAVENOUS

## 2019-01-30 MED ORDER — FENTANYL CITRATE (PF) 250 MCG/5ML IJ SOLN
INTRAMUSCULAR | Status: AC
  Filled 2019-01-30: qty 5

## 2019-01-30 SURGICAL SUPPLY — 36 items
ADH SKN CLS APL DERMABOND .7 (GAUZE/BANDAGES/DRESSINGS) ×1
ARMBAND PINK RESTRICT EXTREMIT (MISCELLANEOUS) ×3 IMPLANT
CANISTER SUCT 3000ML PPV (MISCELLANEOUS) ×2 IMPLANT
CANNULA VESSEL 3MM 2 BLNT TIP (CANNULA) ×2 IMPLANT
CLIP VESOCCLUDE MED 6/CT (CLIP) ×2 IMPLANT
CLIP VESOCCLUDE SM WIDE 6/CT (CLIP) ×2 IMPLANT
COVER PROBE W GEL 5X96 (DRAPES) ×1 IMPLANT
COVER WAND RF STERILE (DRAPES) ×1 IMPLANT
DECANTER SPIKE VIAL GLASS SM (MISCELLANEOUS) ×2 IMPLANT
DERMABOND ADVANCED (GAUZE/BANDAGES/DRESSINGS) ×1
DERMABOND ADVANCED .7 DNX12 (GAUZE/BANDAGES/DRESSINGS) ×1 IMPLANT
DRAIN PENROSE 1/4X12 LTX STRL (WOUND CARE) ×2 IMPLANT
DRAPE SURG 17X23 STRL (DRAPES) ×1 IMPLANT
ELECT REM PT RETURN 9FT ADLT (ELECTROSURGICAL) ×2
ELECTRODE REM PT RTRN 9FT ADLT (ELECTROSURGICAL) ×1 IMPLANT
GLOVE BIO SURGEON STRL SZ7.5 (GLOVE) ×3 IMPLANT
GOWN STRL REUS W/ TWL LRG LVL3 (GOWN DISPOSABLE) ×3 IMPLANT
GOWN STRL REUS W/TWL LRG LVL3 (GOWN DISPOSABLE) ×6
GRAFT GORETEX STRT 4-7X45 (Vascular Products) ×1 IMPLANT
KIT BASIN OR (CUSTOM PROCEDURE TRAY) ×2 IMPLANT
KIT TURNOVER KIT B (KITS) ×2 IMPLANT
LOOP VESSEL MINI RED (MISCELLANEOUS) IMPLANT
NS IRRIG 1000ML POUR BTL (IV SOLUTION) ×2 IMPLANT
PACK CV ACCESS (CUSTOM PROCEDURE TRAY) ×2 IMPLANT
PAD ARMBOARD 7.5X6 YLW CONV (MISCELLANEOUS) ×4 IMPLANT
SPONGE SURGIFOAM ABS GEL 100 (HEMOSTASIS) IMPLANT
SUT PROLENE 6 0 BV (SUTURE) ×4 IMPLANT
SUT PROLENE 7 0 BV 1 (SUTURE) ×3 IMPLANT
SUT SILK 3 0 (SUTURE) ×2
SUT SILK 3-0 18XBRD TIE 12 (SUTURE) IMPLANT
SUT VIC AB 3-0 SH 27 (SUTURE) ×4
SUT VIC AB 3-0 SH 27X BRD (SUTURE) ×1 IMPLANT
SUT VICRYL 4-0 PS2 18IN ABS (SUTURE) ×4 IMPLANT
TOWEL GREEN STERILE (TOWEL DISPOSABLE) ×2 IMPLANT
UNDERPAD 30X30 (UNDERPADS AND DIAPERS) ×2 IMPLANT
WATER STERILE IRR 1000ML POUR (IV SOLUTION) ×2 IMPLANT

## 2019-01-30 NOTE — Anesthesia Postprocedure Evaluation (Signed)
Anesthesia Post Note  Patient: Summer LARIS  Procedure(s) Performed: GORETEX GRAFT PLACEMENT  RIGHT ARM (Right Arm Upper)     Patient location during evaluation: PACU Anesthesia Type: MAC Level of consciousness: awake and alert, patient cooperative and oriented Pain management: pain level controlled Vital Signs Assessment: post-procedure vital signs reviewed and stable Respiratory status: spontaneous breathing, nonlabored ventilation and respiratory function stable Cardiovascular status: blood pressure returned to baseline and stable Postop Assessment: no apparent nausea or vomiting Anesthetic complications: no    Last Vitals:  Vitals:   01/30/19 1011 01/30/19 1025  BP: 109/74 107/78  Pulse: 72 71  Resp: 16 20  Temp:  36.4 C  SpO2: 94% 94%    Last Pain:  Vitals:   01/30/19 1025  TempSrc:   PainSc: 0-No pain                 Abdulhadi Stopa,E. Leeum Sankey

## 2019-01-30 NOTE — Interval H&P Note (Signed)
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  The various methods of treatment have been discussed with the patient and family. After consideration of risks, benefits and other options for treatment, the patient has consented to  Procedure(s): ARTERIOVENOUS (AV) FISTULA CREATION RIGHT ARM (Right) as a surgical intervention.  The patient's history has been reviewed, patient examined, no change in status, stable for surgery.  I have reviewed the patient's chart and labs.  Questions were answered to the patient's satisfaction.     Ruta Hinds

## 2019-01-30 NOTE — Anesthesia Procedure Notes (Signed)
Procedure Name: MAC Date/Time: 01/30/2019 7:35 AM Performed by: Leonor Liv, CRNA Pre-anesthesia Checklist: Patient identified, Emergency Drugs available, Suction available, Patient being monitored and Timeout performed Oxygen Delivery Method: Nasal cannula Placement Confirmation: positive ETCO2 Dental Injury: Teeth and Oropharynx as per pre-operative assessment

## 2019-01-30 NOTE — Discharge Instructions (Signed)
° °  [facility] ° °Discharge Instructions ° °AV Fistula or Graft Surgery for Dialysis Access ° °Please refer to the following instructions for your post-procedure care. Your surgeon or physician assistant will discuss any changes with you. ° °Activity ° °You may drive the day following your surgery, if you are comfortable and no longer taking prescription pain medication. Resume full activity as the soreness in your incision resolves. ° °Bathing/Showering ° °You may shower after you go home. Keep your incision dry for 48 hours. Do not soak in a bathtub, hot tub, or swim until the incision heals completely. You may not shower if you have a hemodialysis catheter. ° °Incision Care ° °Clean your incision with mild soap and water after 48 hours. Pat the area dry with a clean towel. You do not need a bandage unless otherwise instructed. Do not apply any ointments or creams to your incision. You may have skin glue on your incision. Do not peel it off. It will come off on its own in about one week. Your arm may swell a bit after surgery. To reduce swelling use pillows to elevate your arm so it is above your heart. Your doctor will tell you if you need to lightly wrap your arm with an ACE bandage. ° °Diet ° °Resume your normal diet. There are not special food restrictions following this procedure. In order to heal from your surgery, it is CRITICAL to get adequate nutrition. Your body requires vitamins, minerals, and protein. Vegetables are the best source of vitamins and minerals. Vegetables also provide the perfect balance of protein. Processed food has little nutritional value, so try to avoid this. ° °Medications ° °Resume taking all of your medications. If your incision is causing pain, you may take over-the counter pain relievers such as acetaminophen (Tylenol). If you were prescribed a stronger pain medication, please be aware these medications can cause nausea and constipation. Prevent  nausea by taking the medication with a snack or meal. Avoid constipation by drinking plenty of fluids and eating foods with high amount of fiber, such as fruits, vegetables, and grains. Do not take Tylenol if you are taking prescription pain medications. ° ° ° ° °Follow up °Your surgeon may want to see you in the office following your access surgery. If so, this will be arranged at the time of your surgery. ° °Please call us immediately for any of the following conditions: ° °Increased pain, redness, drainage (pus) from your incision site °Fever of 101 degrees or higher °Severe or worsening pain at your incision site °Hand pain or numbness. ° °Reduce your risk of vascular disease: ° °Stop smoking. If you would like help, call [facility] at 1-800-QUIT-NOW (1-800-784-8669) or [place] at [phone] ° °Manage your cholesterol °Maintain a desired weight °Control your diabetes °Keep your blood pressure down ° °Dialysis ° °It will take several weeks to several months for your new dialysis access to be ready for use. Your surgeon will determine when it is OK to use it. Your nephrologist will continue to direct your dialysis. You can continue to use your Permcath until your new access is ready for use. ° °If you have any questions, please call the office at [phone]. ° °

## 2019-01-30 NOTE — Op Note (Addendum)
Procedure: Right Upper Arm AV graft  Preop: ESRD  Postop: ESRD  Anesthesia: Local with sedation  Findings:4-7 mm PTFE end to side to axillary vein  Asst: Matt Eveland PA-C   Procedure Details: After adequate sedation, the right upper extremity was prepped and draped in usual sterile fashion. Local anesthesia was infiltrated in the antecubital crease and axilla.   Ultrasound was used to identify the patient's left basilic vein.  This appeared to be fairly small in caliber however preoperative vein mapping had showed the vein to be about 3 mm diameter.  Local anesthesia was infiltrated in the forearm over this.  Longitudinal incision was made in this location carried on through subcutaneous tissues to expose the vein.  The vein was fairly small less than 2 mm diameter.  I did not feel that it would be adequate for use as a fistula.  Local anesthesia was then infiltrated near the antecubital crease.  An incision was then made near the antecubital crease the left arm.  There were no suitable antecubital veins for outflow.   The incision was carried into the subcutaneous tissues down to level of the brachial artery. Next the brachial artery was dissected free in the medial portion incision. The artery was  3 mm in diameter. The vessel loops were placed proximal and distal to the planned site of arteriotomy. At this point, a longitudinal incision was made in the axilla and carried through the subcutaneous tissues and fascia to expose the axillary vein.  The nerves were protected.  The vein was approximately 4-5 mm in diameter. Next, a subcutaneous tunnel was created connecting the upper arm to the lower arm incision in an arcing configuration over the biceps muscle.  A 4-7 mm PTFE graft was then brought through this subcutaneous tunnel. The patient was given 5000 units of intravenous heparin. After appropriate circulation time, the vessel loops were used to control the artery. A longitudinal opening was made  in the right brachial artery.  The 4 mm end of the graft was beveled and sewn end to side to the artery using a 6 0 prolene.  At completion of the anastomosis the artery was forward bled, backbled and thoroughly flushed.  The anastomosis was secured, vessel loops were released and there was palpable pulse in the graft.  The graft was clamped just above the arterial anastomosis with a fistula clamp. The graft was then pulled taut to length at the axillary incision.  The axillary vein was controlled with a fine bulldog clamp in the upper axilla proximally and distally.  The vein was opened longitudinally.  The distal end of the graft was then beveled and sewn end to side to the vein using a running 6 0 prolene.  Just prior to completion of the anastomosis, everything was forward bled, back bled and thoroughly flushed.  The anastomosis was secured and the fistula clamp removed from the proximal graft.  A thrill was immediately palpable in the graft.  After hemostasis was obtained, the subcutaneous tissues were reapproximated using a running 3-0 Vicryl suture. The skin was then closed with a 4 0 Vicryl subcuticular stitch. Dermabond was applied to the skin incisions.  The patient tolerated the procedure well and there were no complications.  Instrument sponge and needle count was correct at the end of the case.  The patient was taken to the recovery room in stable condition.   Ruta Hinds, MD [facility] Office: [phone] Pager: [phone]

## 2019-01-30 NOTE — Transfer of Care (Signed)
Immediate Anesthesia Transfer of Care Note  Patient: Summer Cook  Procedure(s) Performed: Charlton Haws GRAFT PLACEMENT  RIGHT ARM (Right Arm Upper)  Patient Location: PACU  Anesthesia Type:MAC  Level of Consciousness: awake, alert  and oriented  Airway & Oxygen Therapy: Patient Spontanous Breathing and Patient connected to nasal cannula oxygen  Post-op Assessment: Report given to RN and Post -op Vital signs reviewed and stable  Post vital signs: Reviewed and stable  Last Vitals:  Vitals Value Taken Time  BP    Temp    Pulse 75 01/30/2019  9:57 AM  Resp 25 01/30/2019  9:57 AM  SpO2 93 % 01/30/2019  9:57 AM  Vitals shown include unvalidated device data.  Last Pain:  Vitals:   01/30/19 0643  TempSrc:   PainSc: 5       Patients Stated Pain Goal: 8 (60/73/71 0626)  Complications: No apparent anesthesia complications

## 2019-01-31 ENCOUNTER — Encounter (HOSPITAL_COMMUNITY): Payer: Self-pay | Admitting: Vascular Surgery

## 2019-01-31 DIAGNOSIS — N2581 Secondary hyperparathyroidism of renal origin: Secondary | ICD-10-CM | POA: Diagnosis not present

## 2019-01-31 DIAGNOSIS — Z23 Encounter for immunization: Secondary | ICD-10-CM | POA: Diagnosis not present

## 2019-01-31 DIAGNOSIS — D649 Anemia, unspecified: Secondary | ICD-10-CM | POA: Diagnosis not present

## 2019-01-31 DIAGNOSIS — D509 Iron deficiency anemia, unspecified: Secondary | ICD-10-CM | POA: Diagnosis not present

## 2019-01-31 DIAGNOSIS — E876 Hypokalemia: Secondary | ICD-10-CM | POA: Diagnosis not present

## 2019-01-31 DIAGNOSIS — N170 Acute kidney failure with tubular necrosis: Secondary | ICD-10-CM | POA: Diagnosis not present

## 2019-01-31 DIAGNOSIS — D689 Coagulation defect, unspecified: Secondary | ICD-10-CM | POA: Diagnosis not present

## 2019-02-01 IMAGING — CR DG CHEST 1V PORT
1 series · 1 of 1 positions shown · non-contrast
Comparison: Most recent radiograph 07/12/2017. Most recent chest CT
05/19/2017

CLINICAL DATA: Left-sided chest pain and shortness of breath.

EXAM:
PORTABLE CHEST 1 VIEW

[portable]
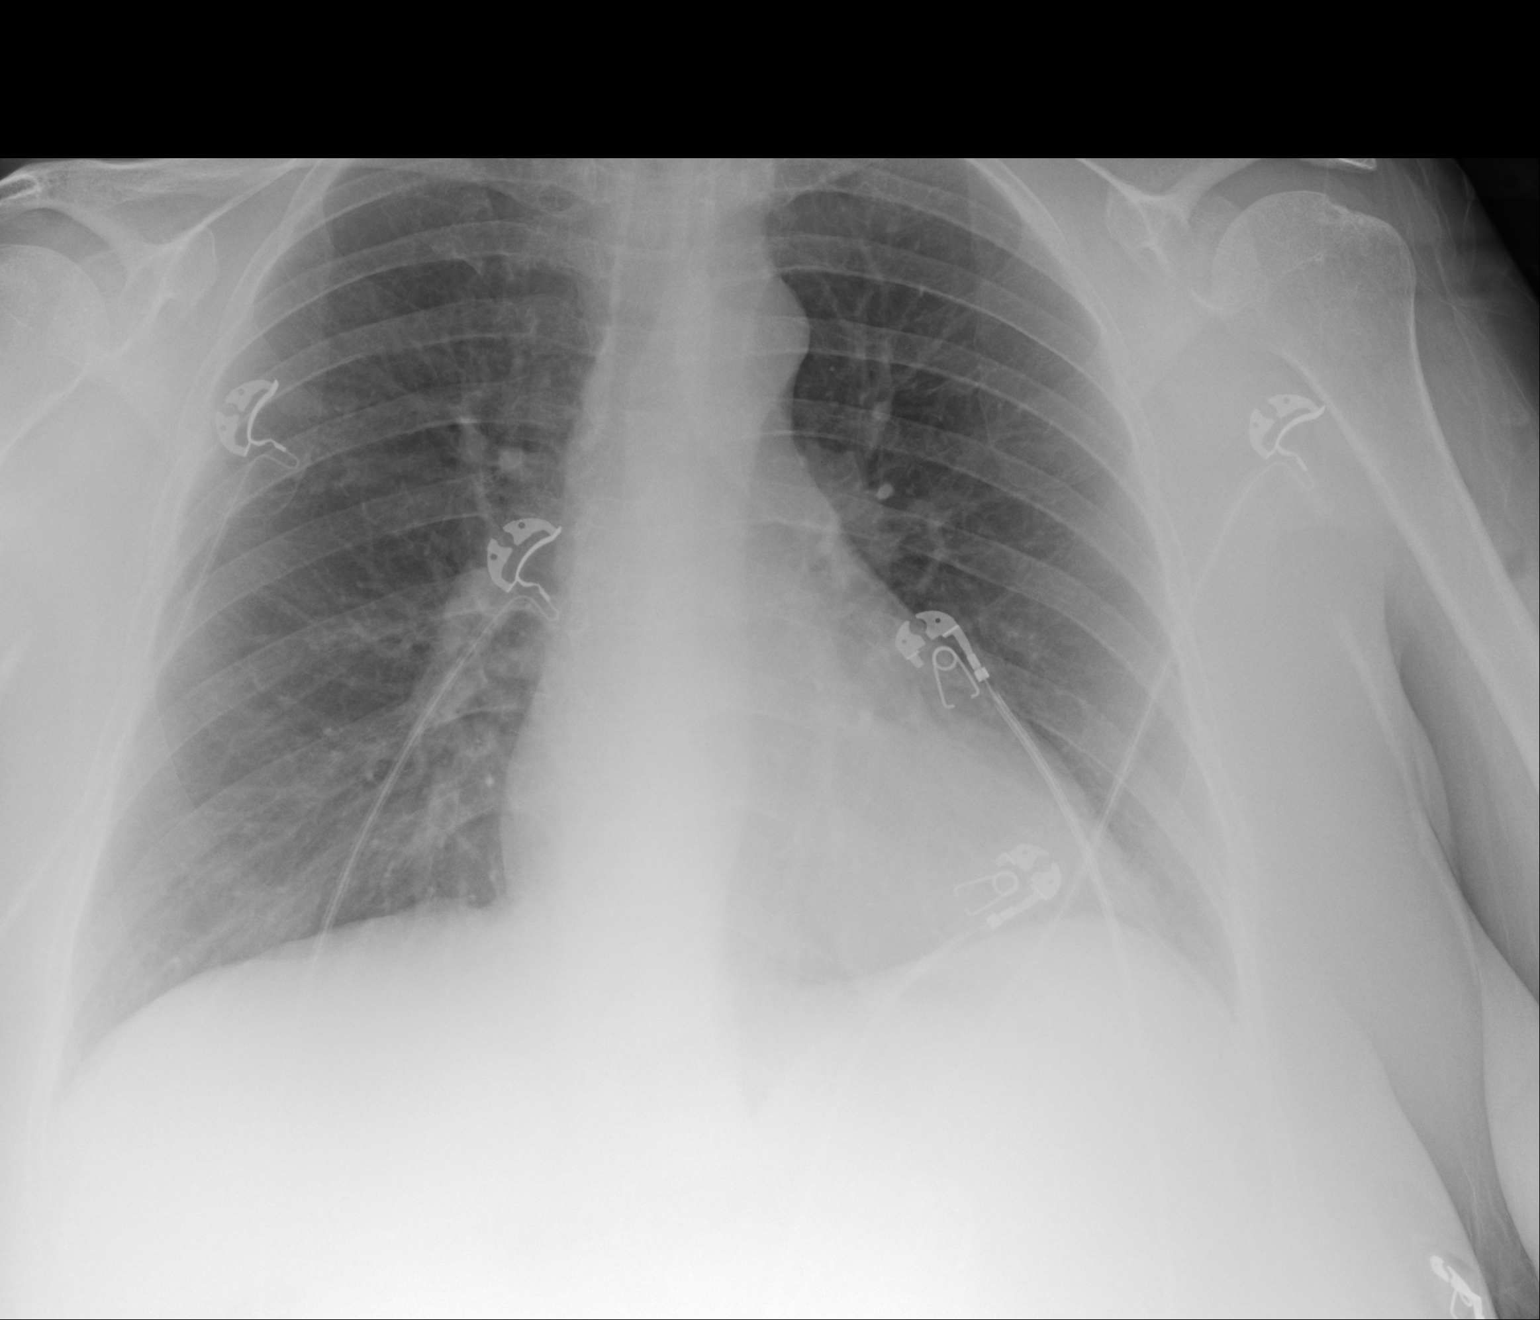

[1 of 1 positions shown; findings below may reference images not displayed]

FINDINGS: The cardiomediastinal contours are unchanged with mild cardiomegaly.
The lungs are clear. Pulmonary vasculature is normal. No
consolidation, pleural effusion, or pneumothorax. No acute osseous
abnormalities are seen.
IMPRESSION: No acute abnormality.  Stable mild cardiomegaly.

## 2019-02-02 DIAGNOSIS — D689 Coagulation defect, unspecified: Secondary | ICD-10-CM | POA: Diagnosis not present

## 2019-02-02 DIAGNOSIS — Z23 Encounter for immunization: Secondary | ICD-10-CM | POA: Diagnosis not present

## 2019-02-02 DIAGNOSIS — E876 Hypokalemia: Secondary | ICD-10-CM | POA: Diagnosis not present

## 2019-02-02 DIAGNOSIS — D509 Iron deficiency anemia, unspecified: Secondary | ICD-10-CM | POA: Diagnosis not present

## 2019-02-02 DIAGNOSIS — N170 Acute kidney failure with tubular necrosis: Secondary | ICD-10-CM | POA: Diagnosis not present

## 2019-02-02 DIAGNOSIS — N179 Acute kidney failure, unspecified: Secondary | ICD-10-CM | POA: Diagnosis not present

## 2019-02-02 DIAGNOSIS — D649 Anemia, unspecified: Secondary | ICD-10-CM | POA: Diagnosis not present

## 2019-02-02 DIAGNOSIS — N2581 Secondary hyperparathyroidism of renal origin: Secondary | ICD-10-CM | POA: Diagnosis not present

## 2019-02-03 ENCOUNTER — Telehealth: Payer: Self-pay

## 2019-02-03 NOTE — Telephone Encounter (Signed)
Pt called and left a message stating that she had coldness in her hand that she had a graft put in on Monday. She was concerned and wanted to be sure that this was normal.   Called pt and she denies any discoloration of the skin, pigmentation looks fine, no swelling of the hand and no pain. Said that even when she squeezes there is no discomfort. She said that there is some swelling and a little bit of warmth at the incision site. Denies any fevers however. Advised pt to work on keeping the arm elevated and work the muscles on the hand to keep the blood flow working well.   She seems to just be concerned and the issues that she is stating do not appear to be concerning out of the usual. She was offered an appt on Monday for [facility] to look at the incision area and she will call if she feels that this is needed. Advised her if the problems become worse over the weekend to go to the ER.   York Cerise,. CMA

## 2019-02-04 DIAGNOSIS — D509 Iron deficiency anemia, unspecified: Secondary | ICD-10-CM | POA: Diagnosis not present

## 2019-02-04 DIAGNOSIS — E876 Hypokalemia: Secondary | ICD-10-CM | POA: Diagnosis not present

## 2019-02-04 DIAGNOSIS — D689 Coagulation defect, unspecified: Secondary | ICD-10-CM | POA: Diagnosis not present

## 2019-02-04 DIAGNOSIS — N170 Acute kidney failure with tubular necrosis: Secondary | ICD-10-CM | POA: Diagnosis not present

## 2019-02-04 DIAGNOSIS — N2581 Secondary hyperparathyroidism of renal origin: Secondary | ICD-10-CM | POA: Diagnosis not present

## 2019-02-04 DIAGNOSIS — D649 Anemia, unspecified: Secondary | ICD-10-CM | POA: Diagnosis not present

## 2019-02-04 DIAGNOSIS — Z23 Encounter for immunization: Secondary | ICD-10-CM | POA: Diagnosis not present

## 2019-02-06 DIAGNOSIS — G894 Chronic pain syndrome: Secondary | ICD-10-CM | POA: Diagnosis not present

## 2019-02-06 DIAGNOSIS — N185 Chronic kidney disease, stage 5: Secondary | ICD-10-CM | POA: Diagnosis not present

## 2019-02-07 DIAGNOSIS — D649 Anemia, unspecified: Secondary | ICD-10-CM | POA: Diagnosis not present

## 2019-02-07 DIAGNOSIS — N2581 Secondary hyperparathyroidism of renal origin: Secondary | ICD-10-CM | POA: Diagnosis not present

## 2019-02-07 DIAGNOSIS — D509 Iron deficiency anemia, unspecified: Secondary | ICD-10-CM | POA: Diagnosis not present

## 2019-02-07 DIAGNOSIS — Z23 Encounter for immunization: Secondary | ICD-10-CM | POA: Diagnosis not present

## 2019-02-07 DIAGNOSIS — D689 Coagulation defect, unspecified: Secondary | ICD-10-CM | POA: Diagnosis not present

## 2019-02-07 DIAGNOSIS — N170 Acute kidney failure with tubular necrosis: Secondary | ICD-10-CM | POA: Diagnosis not present

## 2019-02-07 DIAGNOSIS — E876 Hypokalemia: Secondary | ICD-10-CM | POA: Diagnosis not present

## 2019-02-09 DIAGNOSIS — N170 Acute kidney failure with tubular necrosis: Secondary | ICD-10-CM | POA: Diagnosis not present

## 2019-02-09 DIAGNOSIS — Z23 Encounter for immunization: Secondary | ICD-10-CM | POA: Diagnosis not present

## 2019-02-09 DIAGNOSIS — D689 Coagulation defect, unspecified: Secondary | ICD-10-CM | POA: Diagnosis not present

## 2019-02-09 DIAGNOSIS — D649 Anemia, unspecified: Secondary | ICD-10-CM | POA: Diagnosis not present

## 2019-02-09 DIAGNOSIS — E876 Hypokalemia: Secondary | ICD-10-CM | POA: Diagnosis not present

## 2019-02-09 DIAGNOSIS — N2581 Secondary hyperparathyroidism of renal origin: Secondary | ICD-10-CM | POA: Diagnosis not present

## 2019-02-09 DIAGNOSIS — D509 Iron deficiency anemia, unspecified: Secondary | ICD-10-CM | POA: Diagnosis not present

## 2019-02-11 DIAGNOSIS — D509 Iron deficiency anemia, unspecified: Secondary | ICD-10-CM | POA: Diagnosis not present

## 2019-02-11 DIAGNOSIS — Z23 Encounter for immunization: Secondary | ICD-10-CM | POA: Diagnosis not present

## 2019-02-11 DIAGNOSIS — D649 Anemia, unspecified: Secondary | ICD-10-CM | POA: Diagnosis not present

## 2019-02-11 DIAGNOSIS — N2581 Secondary hyperparathyroidism of renal origin: Secondary | ICD-10-CM | POA: Diagnosis not present

## 2019-02-11 DIAGNOSIS — N170 Acute kidney failure with tubular necrosis: Secondary | ICD-10-CM | POA: Diagnosis not present

## 2019-02-11 DIAGNOSIS — E876 Hypokalemia: Secondary | ICD-10-CM | POA: Diagnosis not present

## 2019-02-11 DIAGNOSIS — D689 Coagulation defect, unspecified: Secondary | ICD-10-CM | POA: Diagnosis not present

## 2019-02-15 DIAGNOSIS — D509 Iron deficiency anemia, unspecified: Secondary | ICD-10-CM | POA: Diagnosis not present

## 2019-02-15 DIAGNOSIS — D649 Anemia, unspecified: Secondary | ICD-10-CM | POA: Diagnosis not present

## 2019-02-15 DIAGNOSIS — E876 Hypokalemia: Secondary | ICD-10-CM | POA: Diagnosis not present

## 2019-02-15 DIAGNOSIS — D689 Coagulation defect, unspecified: Secondary | ICD-10-CM | POA: Diagnosis not present

## 2019-02-15 DIAGNOSIS — N2581 Secondary hyperparathyroidism of renal origin: Secondary | ICD-10-CM | POA: Diagnosis not present

## 2019-02-15 DIAGNOSIS — N170 Acute kidney failure with tubular necrosis: Secondary | ICD-10-CM | POA: Diagnosis not present

## 2019-02-15 DIAGNOSIS — Z23 Encounter for immunization: Secondary | ICD-10-CM | POA: Diagnosis not present

## 2019-02-16 DIAGNOSIS — E876 Hypokalemia: Secondary | ICD-10-CM | POA: Diagnosis not present

## 2019-02-16 DIAGNOSIS — D509 Iron deficiency anemia, unspecified: Secondary | ICD-10-CM | POA: Diagnosis not present

## 2019-02-16 DIAGNOSIS — N179 Acute kidney failure, unspecified: Secondary | ICD-10-CM | POA: Diagnosis not present

## 2019-02-16 DIAGNOSIS — N170 Acute kidney failure with tubular necrosis: Secondary | ICD-10-CM | POA: Diagnosis not present

## 2019-02-16 DIAGNOSIS — D649 Anemia, unspecified: Secondary | ICD-10-CM | POA: Diagnosis not present

## 2019-02-16 DIAGNOSIS — D689 Coagulation defect, unspecified: Secondary | ICD-10-CM | POA: Diagnosis not present

## 2019-02-16 DIAGNOSIS — N2581 Secondary hyperparathyroidism of renal origin: Secondary | ICD-10-CM | POA: Diagnosis not present

## 2019-02-16 DIAGNOSIS — Z23 Encounter for immunization: Secondary | ICD-10-CM | POA: Diagnosis not present

## 2019-02-18 DIAGNOSIS — D509 Iron deficiency anemia, unspecified: Secondary | ICD-10-CM | POA: Diagnosis not present

## 2019-02-18 DIAGNOSIS — Z23 Encounter for immunization: Secondary | ICD-10-CM | POA: Diagnosis not present

## 2019-02-18 DIAGNOSIS — D649 Anemia, unspecified: Secondary | ICD-10-CM | POA: Diagnosis not present

## 2019-02-18 DIAGNOSIS — N170 Acute kidney failure with tubular necrosis: Secondary | ICD-10-CM | POA: Diagnosis not present

## 2019-02-18 DIAGNOSIS — N2581 Secondary hyperparathyroidism of renal origin: Secondary | ICD-10-CM | POA: Diagnosis not present

## 2019-02-18 DIAGNOSIS — D689 Coagulation defect, unspecified: Secondary | ICD-10-CM | POA: Diagnosis not present

## 2019-02-18 DIAGNOSIS — E876 Hypokalemia: Secondary | ICD-10-CM | POA: Diagnosis not present

## 2019-02-21 ENCOUNTER — Inpatient Hospital Stay (HOSPITAL_COMMUNITY): Payer: Medicare Other

## 2019-02-21 DIAGNOSIS — D689 Coagulation defect, unspecified: Secondary | ICD-10-CM | POA: Diagnosis not present

## 2019-02-21 DIAGNOSIS — N2581 Secondary hyperparathyroidism of renal origin: Secondary | ICD-10-CM | POA: Diagnosis not present

## 2019-02-21 DIAGNOSIS — D649 Anemia, unspecified: Secondary | ICD-10-CM | POA: Diagnosis not present

## 2019-02-21 DIAGNOSIS — D509 Iron deficiency anemia, unspecified: Secondary | ICD-10-CM | POA: Diagnosis not present

## 2019-02-21 DIAGNOSIS — E876 Hypokalemia: Secondary | ICD-10-CM | POA: Diagnosis not present

## 2019-02-21 DIAGNOSIS — N170 Acute kidney failure with tubular necrosis: Secondary | ICD-10-CM | POA: Diagnosis not present

## 2019-02-21 DIAGNOSIS — Z23 Encounter for immunization: Secondary | ICD-10-CM | POA: Diagnosis not present

## 2019-02-22 ENCOUNTER — Other Ambulatory Visit: Payer: Self-pay

## 2019-02-22 ENCOUNTER — Inpatient Hospital Stay (HOSPITAL_COMMUNITY): Payer: Medicare Other | Attending: Hematology

## 2019-02-22 DIAGNOSIS — D693 Immune thrombocytopenic purpura: Secondary | ICD-10-CM | POA: Insufficient documentation

## 2019-02-22 LAB — CBC WITH DIFFERENTIAL/PLATELET
Abs Immature Granulocytes: 0.01 10*3/uL (ref 0.00–0.07)
Basophils Absolute: 0.1 10*3/uL (ref 0.0–0.1)
Basophils Relative: 2 %
Eosinophils Absolute: 0.2 10*3/uL (ref 0.0–0.5)
Eosinophils Relative: 3 %
HCT: 28.3 % — ABNORMAL LOW (ref 36.0–46.0)
Hemoglobin: 9 g/dL — ABNORMAL LOW (ref 12.0–15.0)
Immature Granulocytes: 0 %
Lymphocytes Relative: 44 %
Lymphs Abs: 2.3 10*3/uL (ref 0.7–4.0)
MCH: 30.3 pg (ref 26.0–34.0)
MCHC: 31.8 g/dL (ref 30.0–36.0)
MCV: 95.3 fL (ref 80.0–100.0)
Monocytes Absolute: 0.6 10*3/uL (ref 0.1–1.0)
Monocytes Relative: 13 %
Neutro Abs: 1.9 10*3/uL (ref 1.7–7.7)
Neutrophils Relative %: 38 %
Platelets: 137 10*3/uL — ABNORMAL LOW (ref 150–400)
RBC: 2.97 MIL/uL — ABNORMAL LOW (ref 3.87–5.11)
RDW: 15.9 % — ABNORMAL HIGH (ref 11.5–15.5)
WBC: 5.1 10*3/uL (ref 4.0–10.5)
nRBC: 0 % (ref 0.0–0.2)

## 2019-02-22 LAB — LACTATE DEHYDROGENASE: LDH: 215 U/L — ABNORMAL HIGH (ref 98–192)

## 2019-02-22 LAB — COMPREHENSIVE METABOLIC PANEL
ALT: 14 U/L (ref 0–44)
AST: 22 U/L (ref 15–41)
Albumin: 3 g/dL — ABNORMAL LOW (ref 3.5–5.0)
Alkaline Phosphatase: 72 U/L (ref 38–126)
Anion gap: 11 (ref 5–15)
BUN: 34 mg/dL — ABNORMAL HIGH (ref 6–20)
CO2: 26 mmol/L (ref 22–32)
Calcium: 8.2 mg/dL — ABNORMAL LOW (ref 8.9–10.3)
Chloride: 99 mmol/L (ref 98–111)
Creatinine, Ser: 3.38 mg/dL — ABNORMAL HIGH (ref 0.44–1.00)
GFR calc Af Amer: 17 mL/min — ABNORMAL LOW (ref >60)
GFR calc non Af Amer: 15 mL/min — ABNORMAL LOW (ref >60)
Glucose, Bld: 115 mg/dL — ABNORMAL HIGH (ref 70–99)
Potassium: 4 mmol/L (ref 3.5–5.1)
Sodium: 136 mmol/L (ref 135–145)
Total Bilirubin: 0.8 mg/dL (ref 0.3–1.2)
Total Protein: 6.2 g/dL — ABNORMAL LOW (ref 6.5–8.1)

## 2019-02-22 NOTE — Progress Notes (Signed)
For review.  Please update ordering provider

## 2019-02-25 DIAGNOSIS — D649 Anemia, unspecified: Secondary | ICD-10-CM | POA: Diagnosis not present

## 2019-02-25 DIAGNOSIS — D509 Iron deficiency anemia, unspecified: Secondary | ICD-10-CM | POA: Diagnosis not present

## 2019-02-25 DIAGNOSIS — Z23 Encounter for immunization: Secondary | ICD-10-CM | POA: Diagnosis not present

## 2019-02-25 DIAGNOSIS — N2581 Secondary hyperparathyroidism of renal origin: Secondary | ICD-10-CM | POA: Diagnosis not present

## 2019-02-25 DIAGNOSIS — D689 Coagulation defect, unspecified: Secondary | ICD-10-CM | POA: Diagnosis not present

## 2019-02-25 DIAGNOSIS — N170 Acute kidney failure with tubular necrosis: Secondary | ICD-10-CM | POA: Diagnosis not present

## 2019-02-25 DIAGNOSIS — N179 Acute kidney failure, unspecified: Secondary | ICD-10-CM | POA: Diagnosis not present

## 2019-02-25 DIAGNOSIS — E876 Hypokalemia: Secondary | ICD-10-CM | POA: Diagnosis not present

## 2019-02-27 ENCOUNTER — Ambulatory Visit (HOSPITAL_COMMUNITY): Payer: Medicare HMO | Admitting: Hematology

## 2019-02-27 DIAGNOSIS — N186 End stage renal disease: Secondary | ICD-10-CM | POA: Diagnosis not present

## 2019-02-27 DIAGNOSIS — D649 Anemia, unspecified: Secondary | ICD-10-CM | POA: Diagnosis not present

## 2019-02-27 DIAGNOSIS — Z992 Dependence on renal dialysis: Secondary | ICD-10-CM | POA: Diagnosis not present

## 2019-02-27 DIAGNOSIS — Z91030 Bee allergy status: Secondary | ICD-10-CM | POA: Diagnosis not present

## 2019-02-27 DIAGNOSIS — R05 Cough: Secondary | ICD-10-CM | POA: Diagnosis not present

## 2019-02-27 DIAGNOSIS — Z880 Allergy status to penicillin: Secondary | ICD-10-CM | POA: Diagnosis not present

## 2019-02-27 DIAGNOSIS — Z888 Allergy status to other drugs, medicaments and biological substances status: Secondary | ICD-10-CM | POA: Diagnosis not present

## 2019-02-27 DIAGNOSIS — Z79899 Other long term (current) drug therapy: Secondary | ICD-10-CM | POA: Diagnosis not present

## 2019-02-27 DIAGNOSIS — Z87891 Personal history of nicotine dependence: Secondary | ICD-10-CM | POA: Diagnosis not present

## 2019-02-27 DIAGNOSIS — N189 Chronic kidney disease, unspecified: Secondary | ICD-10-CM | POA: Diagnosis not present

## 2019-02-27 DIAGNOSIS — R7989 Other specified abnormal findings of blood chemistry: Secondary | ICD-10-CM | POA: Diagnosis not present

## 2019-02-28 DIAGNOSIS — N179 Acute kidney failure, unspecified: Secondary | ICD-10-CM | POA: Diagnosis not present

## 2019-02-28 DIAGNOSIS — N170 Acute kidney failure with tubular necrosis: Secondary | ICD-10-CM | POA: Diagnosis not present

## 2019-02-28 DIAGNOSIS — D689 Coagulation defect, unspecified: Secondary | ICD-10-CM | POA: Diagnosis not present

## 2019-02-28 DIAGNOSIS — N2581 Secondary hyperparathyroidism of renal origin: Secondary | ICD-10-CM | POA: Diagnosis not present

## 2019-02-28 DIAGNOSIS — D509 Iron deficiency anemia, unspecified: Secondary | ICD-10-CM | POA: Diagnosis not present

## 2019-02-28 DIAGNOSIS — D649 Anemia, unspecified: Secondary | ICD-10-CM | POA: Diagnosis not present

## 2019-02-28 DIAGNOSIS — E876 Hypokalemia: Secondary | ICD-10-CM | POA: Diagnosis not present

## 2019-03-02 ENCOUNTER — Telehealth: Payer: Self-pay | Admitting: Physician Assistant

## 2019-03-02 DIAGNOSIS — D649 Anemia, unspecified: Secondary | ICD-10-CM | POA: Diagnosis not present

## 2019-03-02 DIAGNOSIS — E876 Hypokalemia: Secondary | ICD-10-CM | POA: Diagnosis not present

## 2019-03-02 DIAGNOSIS — D509 Iron deficiency anemia, unspecified: Secondary | ICD-10-CM | POA: Diagnosis not present

## 2019-03-02 DIAGNOSIS — N2581 Secondary hyperparathyroidism of renal origin: Secondary | ICD-10-CM | POA: Diagnosis not present

## 2019-03-02 DIAGNOSIS — N170 Acute kidney failure with tubular necrosis: Secondary | ICD-10-CM | POA: Diagnosis not present

## 2019-03-02 DIAGNOSIS — D689 Coagulation defect, unspecified: Secondary | ICD-10-CM | POA: Diagnosis not present

## 2019-03-02 NOTE — Telephone Encounter (Signed)
Patient is returning call.  °

## 2019-03-02 NOTE — Telephone Encounter (Signed)
Please call patient. I saw her in 12/2018 for telemed visit. One of our pharmacists at the hospital identified a computer glitch that was causing medicines to disappear from lists after 30 days due to the way it was entered at discharge. This is unrelated to any cardiac issues but I went through charts of patients I've seen and noticed Ms. Starliper's previous prescription for sodium bicarbonate was affected. It was previously prescribed "Take 1 tablet (650 mg total) by mouth 2 (two) times daily" at time of her hospital discharge. If she is no longer taking this please ask her to discuss need with her nephrologist. If she has continued to take it from another prescriber, please re-add to med list. Thanks. Thurmond Hildebran PA-C

## 2019-03-02 NOTE — Telephone Encounter (Signed)
Call placed to pt re: phone message below.  Pt was at dialysis so I left a message for pt to call back.

## 2019-03-03 DIAGNOSIS — N185 Chronic kidney disease, stage 5: Secondary | ICD-10-CM | POA: Diagnosis not present

## 2019-03-03 DIAGNOSIS — E8770 Fluid overload, unspecified: Secondary | ICD-10-CM | POA: Diagnosis not present

## 2019-03-03 DIAGNOSIS — R05 Cough: Secondary | ICD-10-CM | POA: Diagnosis not present

## 2019-03-03 DIAGNOSIS — I10 Essential (primary) hypertension: Secondary | ICD-10-CM | POA: Diagnosis not present

## 2019-03-03 NOTE — Telephone Encounter (Signed)
Returned pts call, no answer no machine.

## 2019-03-06 DIAGNOSIS — N183 Chronic kidney disease, stage 3 (moderate): Secondary | ICD-10-CM | POA: Diagnosis not present

## 2019-03-06 DIAGNOSIS — I129 Hypertensive chronic kidney disease with stage 1 through stage 4 chronic kidney disease, or unspecified chronic kidney disease: Secondary | ICD-10-CM | POA: Diagnosis not present

## 2019-03-06 DIAGNOSIS — N041 Nephrotic syndrome with focal and segmental glomerular lesions: Secondary | ICD-10-CM | POA: Diagnosis not present

## 2019-03-06 DIAGNOSIS — N179 Acute kidney failure, unspecified: Secondary | ICD-10-CM | POA: Diagnosis not present

## 2019-03-06 DIAGNOSIS — R809 Proteinuria, unspecified: Secondary | ICD-10-CM | POA: Diagnosis not present

## 2019-03-09 ENCOUNTER — Inpatient Hospital Stay
Admission: AD | Admit: 2019-03-09 | Discharge: 2019-03-14 | DRG: 640 | Disposition: A | Discharge status: Home / Self Care | Payer: Medicare Other | Source: Ambulatory Visit | Attending: Internal Medicine | Admitting: Internal Medicine

## 2019-03-09 DIAGNOSIS — Z8371 Family history of colonic polyps: Secondary | ICD-10-CM

## 2019-03-09 DIAGNOSIS — Z79891 Long term (current) use of opiate analgesic: Secondary | ICD-10-CM

## 2019-03-09 DIAGNOSIS — Z992 Dependence on renal dialysis: Secondary | ICD-10-CM | POA: Diagnosis not present

## 2019-03-09 DIAGNOSIS — L270 Generalized skin eruption due to drugs and medicaments taken internally: Secondary | ICD-10-CM | POA: Diagnosis not present

## 2019-03-09 DIAGNOSIS — N186 End stage renal disease: Secondary | ICD-10-CM | POA: Diagnosis not present

## 2019-03-09 DIAGNOSIS — Z9115 Patient's noncompliance with renal dialysis: Secondary | ICD-10-CM | POA: Diagnosis not present

## 2019-03-09 DIAGNOSIS — I2510 Atherosclerotic heart disease of native coronary artery without angina pectoris: Secondary | ICD-10-CM | POA: Diagnosis present

## 2019-03-09 DIAGNOSIS — Z79899 Other long term (current) drug therapy: Secondary | ICD-10-CM

## 2019-03-09 DIAGNOSIS — Z801 Family history of malignant neoplasm of trachea, bronchus and lung: Secondary | ICD-10-CM | POA: Diagnosis not present

## 2019-03-09 DIAGNOSIS — Z803 Family history of malignant neoplasm of breast: Secondary | ICD-10-CM | POA: Diagnosis not present

## 2019-03-09 DIAGNOSIS — I5022 Chronic systolic (congestive) heart failure: Secondary | ICD-10-CM | POA: Diagnosis present

## 2019-03-09 DIAGNOSIS — K219 Gastro-esophageal reflux disease without esophagitis: Secondary | ICD-10-CM | POA: Diagnosis present

## 2019-03-09 DIAGNOSIS — Z8673 Personal history of transient ischemic attack (TIA), and cerebral infarction without residual deficits: Secondary | ICD-10-CM

## 2019-03-09 DIAGNOSIS — Z800 Family history of malignant neoplasm of digestive organs: Secondary | ICD-10-CM

## 2019-03-09 DIAGNOSIS — E8779 Other fluid overload: Principal | ICD-10-CM | POA: Diagnosis present

## 2019-03-09 DIAGNOSIS — Z7982 Long term (current) use of aspirin: Secondary | ICD-10-CM | POA: Diagnosis not present

## 2019-03-09 DIAGNOSIS — D631 Anemia in chronic kidney disease: Secondary | ICD-10-CM | POA: Diagnosis not present

## 2019-03-09 DIAGNOSIS — Z1159 Encounter for screening for other viral diseases: Secondary | ICD-10-CM | POA: Diagnosis not present

## 2019-03-09 DIAGNOSIS — I10 Essential (primary) hypertension: Secondary | ICD-10-CM | POA: Diagnosis not present

## 2019-03-09 DIAGNOSIS — Z720 Tobacco use: Secondary | ICD-10-CM | POA: Diagnosis not present

## 2019-03-09 DIAGNOSIS — Z8049 Family history of malignant neoplasm of other genital organs: Secondary | ICD-10-CM

## 2019-03-09 DIAGNOSIS — J45909 Unspecified asthma, uncomplicated: Secondary | ICD-10-CM | POA: Diagnosis not present

## 2019-03-09 DIAGNOSIS — T361X5A Adverse effect of cephalosporins and other beta-lactam antibiotics, initial encounter: Secondary | ICD-10-CM | POA: Diagnosis not present

## 2019-03-09 DIAGNOSIS — I120 Hypertensive chronic kidney disease with stage 5 chronic kidney disease or end stage renal disease: Secondary | ICD-10-CM | POA: Diagnosis not present

## 2019-03-09 DIAGNOSIS — N2581 Secondary hyperparathyroidism of renal origin: Secondary | ICD-10-CM | POA: Diagnosis present

## 2019-03-09 DIAGNOSIS — I132 Hypertensive heart and chronic kidney disease with heart failure and with stage 5 chronic kidney disease, or end stage renal disease: Secondary | ICD-10-CM | POA: Diagnosis not present

## 2019-03-09 DIAGNOSIS — N390 Urinary tract infection, site not specified: Secondary | ICD-10-CM | POA: Diagnosis present

## 2019-03-09 DIAGNOSIS — I509 Heart failure, unspecified: Secondary | ICD-10-CM | POA: Diagnosis not present

## 2019-03-09 LAB — COMPREHENSIVE METABOLIC PANEL
ALT: 14 U/L (ref 0–44)
AST: 21 U/L (ref 15–41)
Albumin: 3.2 g/dL — ABNORMAL LOW (ref 3.5–5.0)
Alkaline Phosphatase: 64 U/L (ref 38–126)
Anion gap: 8 (ref 5–15)
BUN: 72 mg/dL — ABNORMAL HIGH (ref 6–20)
CO2: 17 mmol/L — ABNORMAL LOW (ref 22–32)
Calcium: 7.9 mg/dL — ABNORMAL LOW (ref 8.9–10.3)
Chloride: 106 mmol/L (ref 98–111)
Creatinine, Ser: 4.82 mg/dL — ABNORMAL HIGH (ref 0.44–1.00)
GFR calc Af Amer: 11 mL/min — ABNORMAL LOW (ref >60)
GFR calc non Af Amer: 10 mL/min — ABNORMAL LOW (ref >60)
Glucose, Bld: 92 mg/dL (ref 70–99)
Potassium: 3.9 mmol/L (ref 3.5–5.1)
Sodium: 131 mmol/L — ABNORMAL LOW (ref 135–145)
Total Bilirubin: 0.4 mg/dL (ref 0.3–1.2)
Total Protein: 6.3 g/dL — ABNORMAL LOW (ref 6.5–8.1)

## 2019-03-09 LAB — CBC WITH DIFFERENTIAL/PLATELET
Abs Immature Granulocytes: 0.02 10*3/uL (ref 0.00–0.07)
Basophils Absolute: 0.1 10*3/uL (ref 0.0–0.1)
Basophils Relative: 1 %
Eosinophils Absolute: 0 10*3/uL (ref 0.0–0.5)
Eosinophils Relative: 1 %
HCT: 30.6 % — ABNORMAL LOW (ref 36.0–46.0)
Hemoglobin: 9.7 g/dL — ABNORMAL LOW (ref 12.0–15.0)
Immature Granulocytes: 0 %
Lymphocytes Relative: 27 %
Lymphs Abs: 1.6 10*3/uL (ref 0.7–4.0)
MCH: 30.2 pg (ref 26.0–34.0)
MCHC: 31.7 g/dL (ref 30.0–36.0)
MCV: 95.3 fL (ref 80.0–100.0)
Monocytes Absolute: 0.6 10*3/uL (ref 0.1–1.0)
Monocytes Relative: 10 %
Neutro Abs: 3.7 10*3/uL (ref 1.7–7.7)
Neutrophils Relative %: 61 %
Platelets: 138 10*3/uL — ABNORMAL LOW (ref 150–400)
RBC: 3.21 MIL/uL — ABNORMAL LOW (ref 3.87–5.11)
RDW: 16.7 % — ABNORMAL HIGH (ref 11.5–15.5)
WBC: 6 10*3/uL (ref 4.0–10.5)
nRBC: 0 % (ref 0.0–0.2)

## 2019-03-09 LAB — PROTIME-INR
INR: 1 (ref 0.8–1.2)
Prothrombin Time: 13.5 seconds (ref 11.4–15.2)

## 2019-03-09 MED ORDER — NITROGLYCERIN 0.4 MG SL SUBL: 0.4000 mg | SUBLINGUAL_TABLET | SUBLINGUAL | Status: DC | PRN

## 2019-03-09 MED ORDER — SODIUM CHLORIDE 0.9 % IV SOLN
250.0000 mL | INTRAVENOUS | Status: DC | PRN
  Administered 2019-03-13: 250 mL via INTRAVENOUS

## 2019-03-09 MED ORDER — SODIUM CHLORIDE 0.9% FLUSH
3.0000 mL | Freq: Two times a day (BID) | INTRAVENOUS | Status: DC
  Administered 2019-03-09 – 2019-03-13 (×9): 3 mL via INTRAVENOUS

## 2019-03-09 MED ORDER — ISOSORBIDE DINITRATE 10 MG PO TABS
10.0000 mg | ORAL_TABLET | Freq: Two times a day (BID) | ORAL | Status: DC
  Administered 2019-03-09 – 2019-03-14 (×8): 10 mg via ORAL
  Filled 2019-03-09 (×11): qty 1

## 2019-03-09 MED ORDER — ATORVASTATIN CALCIUM 20 MG PO TABS
40.0000 mg | ORAL_TABLET | Freq: Every day | ORAL | Status: DC
  Administered 2019-03-10 – 2019-03-13 (×4): 40 mg via ORAL
  Filled 2019-03-09 (×4): qty 2

## 2019-03-09 MED ORDER — SERTRALINE HCL 50 MG PO TABS
100.0000 mg | ORAL_TABLET | Freq: Every day | ORAL | Status: DC
  Administered 2019-03-10 – 2019-03-14 (×5): 100 mg via ORAL
  Filled 2019-03-09 (×5): qty 2

## 2019-03-09 MED ORDER — HYDROCODONE-ACETAMINOPHEN 5-325 MG PO TABS
1.0000 | ORAL_TABLET | Freq: Four times a day (QID) | ORAL | Status: DC | PRN
  Administered 2019-03-11: 02:00:00 1 via ORAL
  Filled 2019-03-09: qty 1

## 2019-03-09 MED ORDER — METOPROLOL SUCCINATE ER 50 MG PO TB24
50.0000 mg | ORAL_TABLET | Freq: Every day | ORAL | Status: DC
  Administered 2019-03-10 – 2019-03-14 (×4): 50 mg via ORAL
  Filled 2019-03-09 (×4): qty 1

## 2019-03-09 MED ORDER — VENLAFAXINE HCL 37.5 MG PO TABS
37.5000 mg | ORAL_TABLET | Freq: Two times a day (BID) | ORAL | Status: DC
  Administered 2019-03-09 – 2019-03-14 (×10): 37.5 mg via ORAL
  Filled 2019-03-09 (×11): qty 1

## 2019-03-09 MED ORDER — POLYETHYLENE GLYCOL 3350 17 G PO PACK: 17.0000 g | PACK | Freq: Every day | ORAL | Status: DC | PRN

## 2019-03-09 MED ORDER — HEPARIN SODIUM (PORCINE) 5000 UNIT/ML IJ SOLN
5000.0000 [IU] | Freq: Three times a day (TID) | INTRAMUSCULAR | Status: DC
  Administered 2019-03-09 – 2019-03-14 (×12): 5000 [IU] via SUBCUTANEOUS
  Filled 2019-03-09 (×13): qty 1

## 2019-03-09 MED ORDER — DICLOFENAC SODIUM 1 % TD GEL
2.0000 g | Freq: Four times a day (QID) | TRANSDERMAL | Status: DC
  Administered 2019-03-10: 2 g via TOPICAL
  Filled 2019-03-09: qty 100

## 2019-03-09 MED ORDER — ACETAMINOPHEN 500 MG PO TABS
1000.0000 mg | ORAL_TABLET | Freq: Four times a day (QID) | ORAL | Status: DC | PRN
  Administered 2019-03-12: 1000 mg via ORAL
  Filled 2019-03-09: qty 2

## 2019-03-09 MED ORDER — HYDRALAZINE HCL 50 MG PO TABS
50.0000 mg | ORAL_TABLET | Freq: Three times a day (TID) | ORAL | Status: DC
  Administered 2019-03-09 – 2019-03-14 (×9): 50 mg via ORAL
  Filled 2019-03-09 (×9): qty 1

## 2019-03-09 MED ORDER — ONDANSETRON HCL 4 MG/2ML IJ SOLN
4.0000 mg | Freq: Four times a day (QID) | INTRAMUSCULAR | Status: DC | PRN
  Administered 2019-03-10: 4 mg via INTRAVENOUS
  Filled 2019-03-09: qty 2

## 2019-03-09 MED ORDER — PANTOPRAZOLE SODIUM 40 MG PO TBEC
40.0000 mg | DELAYED_RELEASE_TABLET | Freq: Every day | ORAL | Status: DC
  Administered 2019-03-09 – 2019-03-14 (×6): 40 mg via ORAL
  Filled 2019-03-09 (×6): qty 1

## 2019-03-09 MED ORDER — CYANOCOBALAMIN 500 MCG PO TABS
500.0000 ug | ORAL_TABLET | Freq: Every day | ORAL | Status: DC
  Administered 2019-03-10 – 2019-03-14 (×5): 500 ug via ORAL
  Filled 2019-03-09 (×5): qty 1

## 2019-03-09 MED ORDER — SODIUM CHLORIDE 0.9% FLUSH: 3.0000 mL | INTRAVENOUS | Status: DC | PRN

## 2019-03-09 MED ORDER — ALBUTEROL SULFATE (2.5 MG/3ML) 0.083% IN NEBU: 2.5000 mg | INHALATION_SOLUTION | RESPIRATORY_TRACT | Status: DC | PRN

## 2019-03-09 MED ORDER — ALBUTEROL SULFATE (2.5 MG/3ML) 0.083% IN NEBU
2.5000 mg | INHALATION_SOLUTION | Freq: Four times a day (QID) | RESPIRATORY_TRACT | Status: DC
  Administered 2019-03-09 – 2019-03-10 (×2): 2.5 mg via RESPIRATORY_TRACT
  Filled 2019-03-09 (×2): qty 3

## 2019-03-09 MED ORDER — ASPIRIN EC 81 MG PO TBEC
81.0000 mg | DELAYED_RELEASE_TABLET | Freq: Every day | ORAL | Status: DC
  Administered 2019-03-09 – 2019-03-14 (×6): 81 mg via ORAL
  Filled 2019-03-09 (×6): qty 1

## 2019-03-09 MED ORDER — LORATADINE 10 MG PO TABS: 10.0000 mg | ORAL_TABLET | Freq: Every day | ORAL | Status: DC | PRN

## 2019-03-09 MED ORDER — ONDANSETRON HCL 4 MG PO TABS: 4.0000 mg | ORAL_TABLET | Freq: Four times a day (QID) | ORAL | Status: DC | PRN

## 2019-03-09 MED ORDER — ALPRAZOLAM 0.25 MG PO TABS: 0.2500 mg | ORAL_TABLET | Freq: Every evening | ORAL | Status: DC | PRN

## 2019-03-09 NOTE — H&P (Signed)
[facility] [place] [facility] [place] NAME: Summer Cook    MR#:  [id number]  DATE OF BIRTH:  August 03, 1966  DATE OF ADMISSION:  03/09/2019  PRIMARY CARE PHYSICIAN: Redmond School, MD   REQUESTING/REFERRING PHYSICIAN:   CHIEF COMPLAINT:  No chief complaint on file.   HISTORY OF PRESENT ILLNESS: Summer Cook  is a 53 y.o. female with a known history of end-stage renal disease on hemodialysis Tuesday/Thursday/Saturdays, has not received hemodialysis since March 02, 2019-patient states that she did not think she is needed dialysis any further, patient was asked accepted as a direct admission from Dr. Kolluru/nephrology with plans for hemodialysis on tomorrow given worsening shortness of breath/nonproductive cough, on the floor patient is in no apparent distress, resting comfortably in bed, patient states that she is still making urine, patient is now been admitted for chronic end-stage renal disease with plans for hemodialysis on tomorrow.  PAST MEDICAL HISTORY:   Past Medical History:  Diagnosis Date  . Allergy   . Anxiety   . Asthma   . CAD (coronary artery disease), native coronary artery    a. cath 05/20/17 - 100% distal PDA --> medical mangement (no intervention done)  . Candida esophagitis ([place])   . Chronic combined systolic and diastolic CHF (congestive heart failure) ([place])   . Depression   . End stage renal disease on dialysis [facility])    T/ Th/ Sat starting 4/28- [facility]  . Essential hypertension, benign   . GERD (gastroesophageal reflux disease)   . Gout   . Headache   . Hemorrhoids   . 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  Last was Prednisone '40mg'$ ; 2 wk in 10/2012.  She also was on Prednisone bridged with Cellcept briefly but stopped due to lack of insurance.   . Myocardial infarction ([place])   . Perinephric hematoma   . Pneumonia    . Pulmonary embolism ([place]) 04/2012  . Serrated adenoma of colon   . Steroid-induced diabetes ([place])   . Stroke ([place])   . Thrush, oral 05/29/2011    PAST SURGICAL HISTORY:  Past Surgical History:  Procedure Laterality Date  . AV FISTULA PLACEMENT Right 01/23/2019   Procedure: ARTERIOVENOUS (AV) FISTULA CREATION RIGHT ARM;  Surgeon: Elam Dutch, MD;  Location: [place];  Service: Vascular;  Laterality: Right;  . AV FISTULA PLACEMENT Right 01/30/2019   Procedure: GORETEX GRAFT PLACEMENT  RIGHT ARM;  Surgeon: Elam Dutch, MD;  Location: [place];  Service: Vascular;  Laterality: Right;  . BONE MARROW BIOPSY    . CARDIAC CATHETERIZATION    . CARPAL TUNNEL RELEASE    . CARPAL TUNNEL RELEASE Left 05/20/2016   Procedure: CARPAL TUNNEL RELEASE;  Surgeon: Carole Civil, MD;  Location[facility];  Service: Orthopedics;  Laterality: Left;  . CARPAL TUNNEL RELEASE Right 07/16/2016   Procedure: RIGHT CARPAL TUNNEL RELEASE;  Surgeon: Carole Civil, MD;  Location: [facility];  Service: Orthopedics;  Laterality: Right;  . CATARACT EXTRACTION    . CHOLECYSTECTOMY  2008  . COLONOSCOPY WITH ESOPHAGOGASTRODUODENOSCOPY (EGD) N/A 01/04/2013   Dr. Gala Romney: esophageal plaques with +KOH, hh. Gastric antrum abnormal, bx reactive gastropathy. Anal canal hemorrhoids, colonic tics, normal TI, single polyp (sessile serrated tubular adenoma). Next TCS 12/2017  . IR FLUORO GUIDE CV LINE RIGHT  01/10/2019  . IR US GUIDE VASC ACCESS RIGHT  01/10/2019  . LEFT HEART CATH AND  CORONARY ANGIOGRAPHY N/A 05/20/2017   Procedure: LEFT HEART CATH AND CORONARY ANGIOGRAPHY;  Surgeon: Martinique, Peter M, MD;  Location: [place] [facility];  Service: Cardiovascular;  Laterality: N/A;  . 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 TEE WITHOUT CARDIOVERSION N/A 01/03/2016   Procedure: TRANSESOPHAGEAL ECHOCARDIOGRAM (TEE) WITH PROPOFOL;  Surgeon: Satira Sark, MD;  [facility];  Service: Cardiovascular;  Laterality: N/A;    SOCIAL HISTORY:   Social History   Tobacco Use  . Smoking status: Former Smoker    Years: 0.00    Types: Cigarettes    Quit date: 02/14/2009    Years since quitting: 10.0  . Smokeless tobacco: Current User    Types: Snuff  . Tobacco comment:  snus pouch   Substance Use Topics  . Alcohol use: No    Alcohol/week: 0.0 standard drinks    FAMILY HISTORY:  Family History  Problem Relation Age of Onset  . Cervical cancer Mother   . Lung cancer Father   . Colon polyps Father        ???  . Pneumonia Brother   . Colon cancer Paternal Grandmother 88  . AAA (abdominal aortic aneurysm) Paternal Grandmother   . Breast cancer Paternal Grandmother   . Colon cancer Paternal Grandfather 5  . Barrett's esophagus Paternal Aunt   . Stomach cancer Neg Hx   . Pancreatic cancer Neg Hx     DRUG ALLERGIES:  Allergies  Allergen Reactions  . Dilaudid [Hydromorphone Hcl] Anxiety and Other (See Comments)    Causes hallucinations, anxiety, and panic per patient  . [place] Venom Swelling    Swelling at injection site  . Adhesive [Tape] Rash    Please use paper tape  . Brintellix [Vortioxetine] Itching  . Cymbalta [Duloxetine Hcl] Itching  . Penicillins Itching    Did it involve swelling of the face/tongue/throat, SOB, or low BP? No Did it involve sudden or severe rash/hives, skin peeling, or any reaction on the inside of your mouth or nose? No Did you need to seek medical attention at a hospital or doctor's office? No When did it last happen?53 yrs old If all above answers are "NO", may proceed with cephalosporin use.    REVIEW OF SYSTEMS:   CONSTITUTIONAL: No fever, fatigue or weakness.  EYES: No blurred or double vision.  EARS, NOSE, AND THROAT: No tinnitus or ear pain.  RESPIRATORY: + cough, shortness of breath, no wheezing or hemoptysis.  CARDIOVASCULAR: No chest pain, orthopnea,+ edema.  GASTROINTESTINAL: No nausea, vomiting, diarrhea or abdominal pain.  GENITOURINARY: No dysuria,  hematuria.  ENDOCRINE: No polyuria, nocturia,  HEMATOLOGY: No anemia, easy bruising or bleeding SKIN: No rash or lesion. MUSCULOSKELETAL: No joint pain or arthritis.   NEUROLOGIC: No tingling, numbness, weakness.  PSYCHIATRY: No anxiety or depression.   MEDICATIONS AT HOME:  Prior to Admission medications   Medication Sig Start Date End Date Taking? Authorizing Provider  acetaminophen (TYLENOL) 500 MG tablet Take 1,000 mg by mouth every 6 (six) hours as needed for moderate pain.    [provider]  albuterol (PROVENTIL) (2.5 MG/3ML) 0.083% nebulizer solution Take 3 mLs (2.5 mg total) by nebulization every 4 (four) hours as needed for shortness of breath. 01/20/19   Elie Confer, MD  ALPRAZolam Duanne Moron) 0.25 MG tablet Take 1 tablet (0.25 mg total) by mouth at bedtime as needed for anxiety. 01/20/19   Elie Confer, MD  aspirin EC 81 MG EC tablet Take 1 tablet (81 mg total)  by mouth daily. 05/23/17   Eileen Stanford, PA-C  atorvastatin (LIPITOR) 40 MG tablet Take 1 tablet (40 mg total) by mouth daily at 6 PM. 01/20/19   [place], Waneta Martins, MD  Cyanocobalamin (B-12 PO) Take 1 tablet by mouth daily.    [provider]  diclofenac sodium (VOLTAREN) 1 % GEL Apply 2 g topically 4 (four) times daily. 06/06/17   Debbe Odea, MD  hydrALAZINE (APRESOLINE) 50 MG tablet Take 1 tablet (50 mg total) by mouth 3 (three) times daily. 01/26/19 04/26/19  Dunn, Nedra Hai, PA-C  HYDROcodone-acetaminophen (NORCO) 5-325 MG tablet Take 1 tablet by mouth every 6 (six) hours as needed for moderate pain. 01/30/19   Dagoberto Ligas, PA-C  isosorbide dinitrate (ISORDIL) 10 MG tablet Take 1 tablet (10 mg total) by mouth 2 (two) times daily. 01/26/19 01/21/20  Dunn, Nedra Hai, PA-C  loratadine (CLARITIN) 10 MG tablet Take 10 mg by mouth daily as needed for allergies.    [provider]  metoprolol succinate (TOPROL-XL) 50 MG 24 hr tablet Take 50 mg by mouth daily. Take with or immediately following a  meal.    [provider]  nitroGLYCERIN (NITROSTAT) 0.4 MG SL tablet Place 1 tablet (0.4 mg total) under the tongue every 5 (five) minutes x 3 doses as needed for chest pain. 05/25/17   Delos Haring, PA-C  sertraline (ZOLOFT) 100 MG tablet Take 100 mg by mouth daily.    [provider]  venlafaxine (EFFEXOR) 37.5 MG tablet Take 37.5 mg by mouth 2 (two) times daily. 12/22/18   [provider]      PHYSICAL EXAMINATION:   VITAL SIGNS: Blood pressure (!) 137/102, pulse 82, temperature 99.3 F (37.4 C), temperature source Oral, resp. rate 16, last menstrual period 08/13/2011, SpO2 98 %.  GENERAL:  53 y.o.-year-old patient lying in the bed with no acute distress.  EYES: Pupils equal, round, reactive to light and accommodation. No scleral icterus. Extraocular muscles intact.  HEENT: Head atraumatic, normocephalic. Oropharynx and nasopharynx clear.  NECK:  Supple, no jugular venous distention. No thyroid enlargement, no tenderness.  LUNGS: Normal breath sounds bilaterally, no wheezing, rales,rhonchi or crepitation. No use of accessory muscles of respiration.  CARDIOVASCULAR: S1, S2 normal. No murmurs, rubs, or gallops.  ABDOMEN: Soft, nontender, nondistended. Bowel sounds present. No organomegaly or mass.  EXTREMITIES: No pedal edema, cyanosis, or clubbing.  NEUROLOGIC: Cranial nerves II through XII are intact. Muscle strength 5/5 in all extremities. Sensation intact. Gait not checked.  PSYCHIATRIC: The patient is alert and oriented x 3.  SKIN: No obvious rash, lesion, or ulcer.   LABORATORY PANEL:   CBC No results for input(s): WBC, HGB, HCT, PLT, MCV, MCH, MCHC, RDW, LYMPHSABS, MONOABS, EOSABS, BASOSABS, BANDABS in the last 168 hours.  Invalid input(s): NEUTRABS, BANDSABD ------------------------------------------------------------------------------------------------------------------  Chemistries  No results for input(s): NA, K, CL, CO2, GLUCOSE, BUN,  CREATININE, CALCIUM, MG, AST, ALT, ALKPHOS, BILITOT in the last 168 hours.  Invalid input(s): GFRCGP ------------------------------------------------------------------------------------------------------------------ CrCl cannot be calculated (Unknown ideal weight.). ------------------------------------------------------------------------------------------------------------------ No results for input(s): TSH, T4TOTAL, T3FREE, THYROIDAB in the last 72 hours.  Invalid input(s): FREET3   Coagulation profile No results for input(s): INR, PROTIME in the last 168 hours. ------------------------------------------------------------------------------------------------------------------- No results for input(s): DDIMER in the last 72 hours. -------------------------------------------------------------------------------------------------------------------  Cardiac Enzymes No results for input(s): CKMB, TROPONINI, MYOGLOBIN in the last 168 hours.  Invalid input(s): CK ------------------------------------------------------------------------------------------------------------------ Invalid input(s): POCBNP  ---------------------------------------------------------------------------------------------------------------  Urinalysis    Component Value Date/Time   COLORURINE  YELLOW 01/06/2019 2151   APPEARANCEUR CLEAR 01/06/2019 2151   LABSPEC 1.009 01/06/2019 2151   PHURINE 5.0 01/06/2019 2151   GLUCOSEU NEGATIVE 01/06/2019 2151   HGBUR MODERATE (A) 01/06/2019 2151   BILIRUBINUR NEGATIVE 01/06/2019 2151   [place] 01/06/2019 2151   PROTEINUR 100 (A) 01/06/2019 2151   UROBILINOGEN 0.2 02/17/2015 1325   NITRITE NEGATIVE 01/06/2019 2151   LEUKOCYTESUR TRACE (A) 01/06/2019 2151     RADIOLOGY: No results found.  EKG: Orders placed or performed during the hospital encounter of 01/06/19  . ED EKG 12-Lead  . ED EKG 12-Lead  . EKG 12-Lead  . EKG 12-Lead  . EKG 12-Lead  . EKG  12-Lead  . EKG 12-Lead  . EKG 12-Lead  . EKG 12-Lead  . EKG 12-Lead  . EKG 12-Lead  . EKG 12-Lead  . EKG 12-Lead  . EKG 12-Lead  . EKG  . EKG 12-Lead  . EKG 12-Lead   *Note: Due to a large number of results and/or encounters for the requested time period, some results have not been displayed. A complete set of results can be found in Results Review.    IMPRESSION AND PLAN: *Chronic end-stage renal disease Last hemodialysis March 02, 2019, noncompliance with hemodialysis-states that she did not think she needed it any longer Admit to regular nursing for bed, nephrology/Dr. Holley Raring consulted for hemodialysis needs, renal diet  *Chronic systolic congestive heart failure without exacerbation Stable Continue aspirin, statin therapy, Lopressor  *Chronic asthma without exacerbation Breathing treatments PRN  *Chronic GERD PPI daily  *History of CVA Continue aspirin and statin therapy  *Chronic hypertension Stable Continue home regiment  *Chronic CAD Stable Continue aspirin, Lipitor, nitrates as needed, Lopressor   All the records are reviewed and case discussed with ED provider. Management plans discussed with the patient, family and they are in agreement.  CODE STATUS:full Code Status History    Date Active Date Inactive Code Status Order ID Comments User Context   01/07/2019 0720 01/20/2019 2206 Full Code [id number]  Jani Gravel, MD ED   12/31/2018 0255 01/04/2019 1741 Full Code [id number]  Oswald Hillock, MD Inpatient   04/29/2018 0226 04/29/2018 1921 Full Code [id number]  Reubin Milan, MD Inpatient   04/29/2018 0226 04/29/2018 0226 Full Code [id number]  Reubin Milan, MD Inpatient   06/06/2017 0119 06/06/2017 2002 Full Code [id number]  Toy Baker, MD Inpatient   05/29/2017 0552 06/02/2017 1922 Full Code [id number]  Lujean Amel, MD Inpatient   05/23/2017 1754 05/25/2017 1346 Full Code [id number]  Reola Mosher Inpatient   05/19/2017 1913 05/22/2017 1528 Full Code  [id number]  Deneise Lever, MD ED   12/16/2015 2320 12/17/2015 1515 Full Code [id number]  Oswald Hillock, MD ED   02/16/2015 1741 02/19/2015 0325 Full Code [id number]  Kathie Dike, MD Inpatient   04/28/2014 1930 04/29/2014 1814 Full Code [id number]  Kathie Dike, MD Inpatient   01/25/2013 1727 01/27/2013 2010 Full Code [id number]  Samuella Cota, MD Inpatient   05/03/2012 0239 05/04/2012 1520 Full Code [id number]  De Burrs, RN Inpatient   Advance Care Planning Activity       TOTAL TIME TAKING CARE OF THIS PATIENT: 40 minutes.    Avel Peace Kendahl Bumgardner M.D on 03/09/2019   Between 7am to 6pm - Pager - [phone]  After 6pm go to [email or web address] - password [email or web address] [facility]  Office  [phone]  CC: Primary care physician; Redmond School, MD   Note: This dictation  was prepared with Dragon dictation along with smaller phrase technology. Any transcriptional errors that result from this process are unintentional.

## 2019-03-09 NOTE — Telephone Encounter (Signed)
Tried to call patient, no answer. Patient is a [place] patient will forward to their triage to follow up.

## 2019-03-09 NOTE — Progress Notes (Signed)
Family Meeting Note  Advance Directive:yes  Today a meeting took place with the Patient.  Patient is able to participate  The following clinical team members were present during this meeting:MD  The following were discussed:Patient's diagnosis:53 y.o. female with a known history of end-stage renal disease on hemodialysis Tuesday/Thursday/Saturdays, has not received hemodialysis since March 02, 2019-patient states that she did not think she is needed dialysis any further, patient was asked accepted as a direct admission from Dr. Kolluru/nephrology with plans for hemodialysis on tomorrow given worsening shortness of breath/nonproductive cough, on the floor patient is in no apparent distress, resting comfortably in bed, patient states that she is still making urine, patient is now been admitted for chronic end-stage renal disease with plans for hemodialysis on tomorrow, Patient's progosis: Unable to determine and Goals for treatment: Full Code  Additional follow-up to be provided: prn  Time spent during discussion:20 minutes  Gorden Harms, MD

## 2019-03-10 ENCOUNTER — Other Ambulatory Visit: Payer: Self-pay

## 2019-03-10 LAB — BASIC METABOLIC PANEL
Anion gap: 7 (ref 5–15)
BUN: 73 mg/dL — ABNORMAL HIGH (ref 6–20)
CO2: 19 mmol/L — ABNORMAL LOW (ref 22–32)
Calcium: 8.1 mg/dL — ABNORMAL LOW (ref 8.9–10.3)
Chloride: 111 mmol/L (ref 98–111)
Creatinine, Ser: 5.03 mg/dL — ABNORMAL HIGH (ref 0.44–1.00)
GFR calc Af Amer: 11 mL/min — ABNORMAL LOW (ref >60)
GFR calc non Af Amer: 9 mL/min — ABNORMAL LOW (ref >60)
Glucose, Bld: 99 mg/dL (ref 70–99)
Potassium: 4.6 mmol/L (ref 3.5–5.1)
Sodium: 137 mmol/L (ref 135–145)

## 2019-03-10 LAB — PHOSPHORUS: Phosphorus: 7.1 mg/dL — ABNORMAL HIGH (ref 2.5–4.6)

## 2019-03-10 MED ORDER — HEPARIN SODIUM (PORCINE) 1000 UNIT/ML DIALYSIS
1000.0000 [IU] | INTRAMUSCULAR | Status: DC | PRN
  Filled 2019-03-10: qty 1

## 2019-03-10 MED ORDER — SODIUM CHLORIDE 0.9 % IV SOLN: 100.0000 mL | INTRAVENOUS | Status: DC | PRN

## 2019-03-10 MED ORDER — ALTEPLASE 2 MG IJ SOLR: 2.0000 mg | Freq: Once | INTRAMUSCULAR | Status: DC | PRN

## 2019-03-10 MED ORDER — CHLORHEXIDINE GLUCONATE CLOTH 2 % EX PADS
6.0000 | MEDICATED_PAD | Freq: Every day | CUTANEOUS | Status: DC
  Administered 2019-03-11 – 2019-03-14 (×4): 6 via TOPICAL

## 2019-03-10 MED ORDER — LIDOCAINE-PRILOCAINE 2.5-2.5 % EX CREA
1.0000 "application " | TOPICAL_CREAM | CUTANEOUS | Status: DC | PRN
  Filled 2019-03-10: qty 5

## 2019-03-10 MED ORDER — FUROSEMIDE 10 MG/ML IJ SOLN
INTRAMUSCULAR | Status: AC
  Filled 2019-03-10: qty 4

## 2019-03-10 MED ORDER — PENTAFLUOROPROP-TETRAFLUOROETH EX AERO
1.0000 "application " | INHALATION_SPRAY | CUTANEOUS | Status: DC | PRN
  Filled 2019-03-10: qty 30

## 2019-03-10 MED ORDER — ALBUTEROL SULFATE HFA 108 (90 BASE) MCG/ACT IN AERS
1.0000 | INHALATION_SPRAY | RESPIRATORY_TRACT | Status: DC | PRN
  Filled 2019-03-10: qty 6.7

## 2019-03-10 MED ORDER — ALBUTEROL SULFATE HFA 108 (90 BASE) MCG/ACT IN AERS
2.0000 | INHALATION_SPRAY | Freq: Four times a day (QID) | RESPIRATORY_TRACT | Status: DC
  Administered 2019-03-10 (×3): 2 via RESPIRATORY_TRACT
  Filled 2019-03-10: qty 6.7

## 2019-03-10 MED ORDER — FUROSEMIDE 10 MG/ML IJ SOLN
80.0000 mg | Freq: Once | INTRAMUSCULAR | Status: AC
  Administered 2019-03-10: 80 mg via INTRAVENOUS
  Filled 2019-03-10: qty 8

## 2019-03-10 MED ORDER — LIDOCAINE HCL (PF) 1 % IJ SOLN
5.0000 mL | INTRAMUSCULAR | Status: DC | PRN
  Filled 2019-03-10: qty 5

## 2019-03-10 NOTE — Progress Notes (Signed)
Pre HD Tx    03/10/19 1830  Hand-Off documentation  Report given to (Full Name) Trellis Paganini RN  Report received from (Full Name) Rochel Brome RN  Vital Signs  Temp 98.6 F (37 C)  Temp Source Oral  Pulse Rate 86  Pulse Rate Source Monitor  Resp 16  BP (!) 145/90  BP Location Left Arm  BP Method Automatic  Patient Position (if appropriate) Lying  Oxygen Therapy  SpO2 95 %  O2 Device Room Air  Pain Assessment  Pain Scale 0-10  Pain Score 0  Dialysis Weight  Weight 82.5 kg  Type of Weight Pre-Dialysis  Time-Out for Hemodialysis  What Procedure? Hemodialysis   Pt Identifiers(min of two) First/Last Name;MRN/Account#  Correct Site? Yes  Correct Side? Yes  Correct Procedure? Yes  Consents Verified? Yes  Rad Studies Available? N/A  Safety Precautions Reviewed? Yes  Engineer, civil (consulting) Number 2  Station Number  (Room 203)  UF/Alarm Test Passed  Conductivity: Meter 14  Conductivity: Machine  13.9  pH 7  Reverse Osmosis WRO1  Normal Saline Lot Number [id number]  Dialyzer Lot Number [id number]  Disposable Set Lot Number [id number]  Machine Temperature 98.6 F (37 C)  Musician and Audible Yes  Blood Lines Intact and Secured Yes  Pre Treatment Patient Checks  Vascular access used during treatment Catheter  Hepatitis B Surface Antigen Results Negative  Date Hepatitis B Surface Antigen Drawn 01/09/19 (OUTDATED - machine to be reserved/bleached)  Hemodialysis Consent Verified Yes  Hemodialysis Standing Orders Initiated Yes  ECG (Telemetry) Monitor On Yes  Prime Ordered Normal Saline  Length of  DialysisTreatment -hour(s) 2.5 Hour(s)  Dialysis Treatment Comments Na 140  Dialyzer Elisio 17H NR  Dialysate 3K, 2.5 Ca  Dialysate Flow Ordered 500  Blood Flow Rate Ordered 250 mL/min  Ultrafiltration Goal 1 Liters  Dialysis Blood Pressure Support Ordered Normal Saline  Education / Care Plan  Dialysis Education Provided Yes  Documented Education in Care Plan Yes   Fistula / Graft Right Upper arm Arteriovenous vein graft  Placement Date/Time: 01/30/19 0834   Placed prior to admission: No  Orientation: Right  Access Location: Upper arm  Access Type: Arteriovenous vein graft  Expiration Date: 12/13/22  Site Condition No complications  Fistula / Graft Assessment Present;Thrill;Bruit  Drainage Description None  Hemodialysis Catheter Right Internal jugular Double-lumen;Permanent  Placement Date/Time: 01/10/19 1349   Time Out: Correct procedure;Correct patient;Correct site  Maximum sterile barrier precautions: Hand hygiene;Cap;Mask;Sterile gown;Large sterile sheet;Sterile gloves  Site Prep: Chlorhexidine;Skin Prep Completely Dr...  Site Condition No complications  Blue Lumen Status Capped (Central line)  Red Lumen Status Capped (Central line)  Dressing Type Gauze/Drain sponge  Dressing Status Clean;Dry;Intact  Drainage Description None

## 2019-03-10 NOTE — Progress Notes (Signed)
[facility]  ROUNDING NOTE   Subjective:  Patient well-known to [place]. She started hemodialysis in April. She has been dialyzing in [place] at [facility]. Recently she is missed dialysis treatments as she felt that she did not needed as she is still producing some urine. However she is developing worsening shortness of breath.   Objective:  Vital signs in last 24 hours:  Temp:  [98.2 F (36.8 C)-99.3 F (37.4 C)] 98.5 F (36.9 C) (06/12 1256) Pulse Rate:  [79-94] 89 (06/12 1256) Resp:  [16-20] 18 (06/12 1256) BP: (137-154)/(90-109) 140/96 (06/12 1351) SpO2:  [89 %-98 %] 92 % (06/12 1256) Weight:  [82.5 kg] 82.5 kg (06/12 0500)  Weight change:  Filed Weights   03/10/19 0500  Weight: 82.5 kg    Intake/Output: No intake/output data recorded.   Intake/Output this shift:  Total I/O In: -  Out: 900 [Urine:900]  Physical Exam: General: Mildly increased work of breathing  Head: Normocephalic, atraumatic. Moist oral mucosal membranes  Eyes: Anicteric  Neck: Supple, trachea midline  Lungs:   Basilar rales, slightly increased work of breathing  Heart: S1S2 no rubs  Abdomen:  Soft, nontender, bowel sounds present  Extremities: 2+ peripheral edema.  Neurologic: Awake, alert, following commands  Skin: No lesions  Access: IJ PermCath in place    Basic Metabolic Panel: Recent Labs  Lab 03/09/19 1951 03/10/19 0524  NA 131* 137  K 3.9 4.6  CL 106 111  CO2 17* 19*  GLUCOSE 92 99  BUN 72* 73*  CREATININE 4.82* 5.03*  CALCIUM 7.9* 8.1*    Liver Function Tests: Recent Labs  Lab 03/09/19 1951  AST 21  ALT 14  ALKPHOS 64  BILITOT 0.4  PROT 6.3*  ALBUMIN 3.2*   No results for input(s): LIPASE, AMYLASE in the last 168 hours. No results for input(s): AMMONIA in the last 168 hours.  CBC: Recent Labs  Lab 03/09/19 1951  WBC 6.0  NEUTROABS 3.7  HGB 9.7*  HCT 30.6*  MCV 95.3  PLT 138*    Cardiac Enzymes: No results for input(s): CKTOTAL,  CKMB, CKMBINDEX, TROPONINI in the last 168 hours.  BNP: Invalid input(s): POCBNP  CBG: No results for input(s): GLUCAP in the last 168 hours.  Microbiology: Results for orders placed or performed during the hospital encounter of 01/06/19  Blood Culture (routine x 2)     Status: None   Collection Time: 01/06/19  9:56 PM   Specimen: Right Antecubital; Blood  Result Value Ref Range Status   Specimen Description RIGHT ANTECUBITAL  Final   Special Requests   Final    BOTTLES DRAWN AEROBIC AND ANAEROBIC Blood Culture adequate volume   Culture   Final    NO GROWTH 5 DAYS Performed at [facility], [address], [place], [place] [address]    Report Status 01/11/2019 FINAL  Final  Blood Culture (routine x 2)     Status: None   Collection Time: 01/06/19 10:00 PM   Specimen: Left Antecubital; Blood  Result Value Ref Range Status   Specimen Description LEFT ANTECUBITAL  Final   Special Requests   Final    BOTTLES DRAWN AEROBIC AND ANAEROBIC Blood Culture adequate volume   Culture   Final    NO GROWTH 5 DAYS Performed at [facility], [address] [place], [place] [address]    Report Status 01/11/2019 FINAL  Final  Respiratory Panel by PCR     Status: None   Collection Time: 01/07/19  7:20 AM  Specimen: Nasopharyngeal Swab; Respiratory  Result Value Ref Range Status   Adenovirus NOT DETECTED NOT DETECTED Final   Coronavirus 229E NOT DETECTED NOT DETECTED Final    Comment: (NOTE) The Coronavirus on the Respiratory Panel, DOES NOT test for the novel  Coronavirus (2019 nCoV)    Coronavirus HKU1 NOT DETECTED NOT DETECTED Final   Coronavirus NL63 NOT DETECTED NOT DETECTED Final   Coronavirus OC43 NOT DETECTED NOT DETECTED Final   Metapneumovirus NOT DETECTED NOT DETECTED Final   Rhinovirus / Enterovirus NOT DETECTED NOT DETECTED Final   Influenza A NOT DETECTED NOT DETECTED Final   Influenza B NOT DETECTED NOT DETECTED Final   Parainfluenza Virus 1 NOT DETECTED NOT DETECTED Final    Parainfluenza Virus 2 NOT DETECTED NOT DETECTED Final   Parainfluenza Virus 3 NOT DETECTED NOT DETECTED Final   Parainfluenza Virus 4 NOT DETECTED NOT DETECTED Final   Respiratory Syncytial Virus NOT DETECTED NOT DETECTED Final   Bordetella pertussis NOT DETECTED NOT DETECTED Final   Chlamydophila pneumoniae NOT DETECTED NOT DETECTED Final   Mycoplasma pneumoniae NOT DETECTED NOT DETECTED Final    Comment: Performed at [facility], [place]. [address], [place], [place] [address]  Novel Coronavirus, NAA (hospital order; send-out to ref lab)     Status: None   Collection Time: 01/07/19  7:30 AM   Specimen: Nasopharyngeal Swab; Respiratory  Result Value Ref Range Status   SARS-CoV-2, NAA NOT DETECTED NOT DETECTED Final    Comment: Performed at [facility] Clinical Labs Performed at [facility], [address], [place], [place] [address]    Coronavirus Source NASOPHARYNGEAL  Final    Comment: Performed at [facility], [address], [place], [place] [address]  MRSA PCR Screening     Status: None   Collection Time: 01/13/19  1:52 AM   Specimen: Nasal Mucosa; Nasopharyngeal  Result Value Ref Range Status   MRSA by PCR NEGATIVE NEGATIVE Final    Comment:        The GeneXpert MRSA Assay (FDA approved for NASAL specimens only), is one component of a comprehensive MRSA colonization surveillance program. It is not intended to diagnose MRSA infection nor to guide or monitor treatment for MRSA infections. Performed at [facility], [place] [address] [place], [place] [address]    *Note: Due to a large number of results and/or encounters for the requested time period, some results have not been displayed. A complete set of results can be found in Results Review.    Coagulation Studies: Recent Labs    03/09/19 1951  LABPROT 13.5  INR 1.0    Urinalysis: No results for input(s): COLORURINE, LABSPEC, PHURINE, GLUCOSEU, HGBUR, BILIRUBINUR, KETONESUR, PROTEINUR, UROBILINOGEN,  NITRITE, LEUKOCYTESUR in the last 72 hours.  Invalid input(s): APPERANCEUR    Imaging: No results found.   Medications:   . sodium chloride     . albuterol  2 puff Inhalation Q6H  . aspirin EC  81 mg Oral Daily  . atorvastatin  40 mg Oral q1800  . Chlorhexidine Gluconate Cloth  6 each Topical Q0600  . diclofenac sodium  2 g Topical QID  . furosemide      . heparin  5,000 Units Subcutaneous Q8H  . hydrALAZINE  50 mg Oral TID  . isosorbide dinitrate  10 mg Oral BID  . metoprolol succinate  50 mg Oral Daily  . pantoprazole  40 mg Oral Daily  . sertraline  100 mg Oral Daily  . sodium  chloride flush  3 mL Intravenous Q12H  . venlafaxine  37.5 mg Oral BID  . vitamin B-12  500 mcg Oral Daily   sodium chloride, acetaminophen, albuterol, ALPRAZolam, HYDROcodone-acetaminophen, loratadine, nitroGLYCERIN, ondansetron **OR** ondansetron (ZOFRAN) IV, polyethylene glycol, sodium chloride flush  Assessment/ Plan:  53 y.o. 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  1.  ESRD on HD.  Patient has missed at least 3 dialysis treatments.  We will reinitiate dialysis treatment today but plan for short treatment today.  We will also plan for a longer dialysis treatment tomorrow.  Patient has requested a change in her outpatient dialysis center and we will begin the process of transfer.  2.  Hypertension.  Maintain the patient on hydralazine, isosorbide dinitrate, and metoprolol.  3.  Anemia chronic kidney disease.  current hemoglobin 9.7.  We will restart the patient on Epogen this hospitalization.  4.  Secondary hyperparathyroidism.  Follow-up PTH and phosphorus today.   LOS: 1 Jaeleah Smyser 6/12/20202:32 PM

## 2019-03-10 NOTE — Telephone Encounter (Signed)
Pt currently hospitalized.

## 2019-03-10 NOTE — Progress Notes (Addendum)
Pre HD Assessment    03/10/19 1830  Neurological  Level of Consciousness Alert  Orientation Level Oriented X4  Respiratory  Respiratory Pattern Regular;Labored  Chest Assessment Chest expansion symmetrical  Bilateral Breath Sounds Diminished;Clear  Cardiac  Pulse Regular  Heart Sounds S1, S2  ECG Monitor Yes  Cardiac Rhythm NSR  Vascular  R Radial Pulse +2  L Radial Pulse +2  Edema Generalized  Integumentary  Integumentary (WDL) X  Skin Color Appropriate for ethnicity  Additional Integumentary Comments  (HD Pt)  Musculoskeletal  Musculoskeletal (WDL) WDL  Gastrointestinal  Bowel Sounds Assessment Active  GU Assessment  Genitourinary (WDL) X  Genitourinary Symptoms  (HD pt - oliguria)  Urine Characteristics  Urine Color Yellow/straw  Urine Appearance Clear  Psychosocial  Psychosocial (WDL) WDL

## 2019-03-10 NOTE — Progress Notes (Signed)
[place] [facility] [place] NAME: Summer Cook    MR#:  [id number]  DATE OF BIRTH:  June 22, 1966  SUBJECTIVE: Patient admitted for shortness of breath, missed hemodialysis sessions, need for hemodialysis.  Patient missed for hemodialysis sessions.  CHIEF COMPLAINT:  No chief complaint on file.   REVIEW OF SYSTEMS:   ROS CONSTITUTIONAL: No fever, fatigue or weakness.  EYES: No blurred or double vision.  EARS, NOSE, AND THROAT: No tinnitus or ear pain.  RESPIRATORY: Shortness of breath, pedal edema cARDIOVASCULAR: No chest pain, orthopnea, edema.  GASTROINTESTINAL: No nausea, vomiting, diarrhea or abdominal pain.  GENITOURINARY: No dysuria, hematuria.  ENDOCRINE: No polyuria, nocturia,  HEMATOLOGY: No anemia, easy bruising or bleeding SKIN: No rash or lesion. MUSCULOSKELETAL: No joint pain or arthritis.   NEUROLOGIC: No tingling, numbness, weakness.  PSYCHIATRY: No anxiety or depression.   DRUG ALLERGIES:   Allergies  Allergen Reactions  . Dilaudid [Hydromorphone Hcl] Anxiety and Other (See Comments)    Causes hallucinations, anxiety, and panic per patient  . [place] Venom Swelling    Swelling at injection site  . Adhesive [Tape] Rash    Please use paper tape  . Brintellix [Vortioxetine] Itching  . Cymbalta [Duloxetine Hcl] Itching  . Penicillins Itching    Did it involve swelling of the face/tongue/throat, SOB, or low BP? No Did it involve sudden or severe rash/hives, skin peeling, or any reaction on the inside of your mouth or nose? No Did you need to seek medical attention at a hospital or doctor's office? No When did it last happen?53 yrs old If all above answers are "NO", may proceed with cephalosporin use.    VITALS:  Blood pressure (!) 154/109, pulse 94, temperature 98.2 F (36.8 C), temperature source Oral, resp. rate 18, weight 82.5 kg, last menstrual period 08/13/2011, SpO2 (!) 89 %.  PHYSICAL  EXAMINATION:  GENERAL:  53 y.o.-year-old patient lying in the bed with no acute distress.  EYES: Pupils equal, round, reactive to light and accommodation. No scleral icterus. Extraocular muscles intact.  HEENT: Head atraumatic, normocephalic. Oropharynx and nasopharynx clear.  NECK:  Supple, no jugular venous distention. No thyroid enlargement, no tenderness.  LUNGS: Managed breath sounds bilaterally. CARDIOVASCULAR: S1, S2 normal. No murmurs, rubs, or gallops.  ABDOMEN: Soft, nontender, nondistended. Bowel sounds present. No organomegaly or mass.  EXTREMITIES: Bilateral leg edema nEUROLOGIC: Cranial nerves II through XII are intact. Muscle strength 5/5 in all extremities. Sensation intact. Gait not checked.  PSYCHIATRIC: The patient is alert and oriented x 3.  SKIN: No obvious rash, lesion, or ulcer.    LABORATORY PANEL:   CBC Recent Labs  Lab 03/09/19 1951  WBC 6.0  HGB 9.7*  HCT 30.6*  PLT 138*   ------------------------------------------------------------------------------------------------------------------  Chemistries  Recent Labs  Lab 03/09/19 1951 03/10/19 0524  NA 131* 137  K 3.9 4.6  CL 106 111  CO2 17* 19*  GLUCOSE 92 99  BUN 72* 73*  CREATININE 4.82* 5.03*  CALCIUM 7.9* 8.1*  AST 21  --   ALT 14  --   ALKPHOS 64  --   BILITOT 0.4  --    ------------------------------------------------------------------------------------------------------------------  Cardiac Enzymes No results for input(s): TROPONINI in the last 168 hours. ------------------------------------------------------------------------------------------------------------------  RADIOLOGY:  No results found.  EKG:   Orders placed or performed during the hospital encounter of 01/06/19  . ED EKG 12-Lead  . ED EKG 12-Lead  . EKG 12-Lead  . EKG 12-Lead  .  EKG 12-Lead  . EKG 12-Lead  . EKG 12-Lead  . EKG 12-Lead  . EKG 12-Lead  . EKG 12-Lead  . EKG 12-Lead  . EKG 12-Lead  . EKG  12-Lead  . EKG 12-Lead  . EKG  . EKG 12-Lead  . EKG 12-Lead   *Note: Due to a large number of results and/or encounters for the requested time period, some results have not been displayed. A complete set of results can be found in Results Review.    ASSESSMENT AND PLAN:   53 year old female patient with history of ESRD on dialysis Tuesday, Thursday, Saturday missed hemodialysis sessions as she felt she did not need it, last dialysis was on June 4 comes in because of worsening shortness of breath, pedal edema along with cough..  Acute shortness of breath secondary to missed hemodialysis sessions: Seen by Dr. Jesse Sans, will arrange for hemodialysis today, patient wants to to go to different dialysis place.  Dialysis liaison to arrange that. 2.  Hypertension: Controlled secondary to need for dialysis.  Patient to get IV Lasix while waiting for dialysis.   All the records are reviewed and case discussed with Care Management/Social Workerr. Management plans discussed with the patient, family and they are in agreement.  CODE STATUS: Full code  TOTAL TIME TAKING CARE OF THIS PATIENT: 35 minutes.  More than 50% time spent in counseling, coordination of care. POSSIBLE D/C IN 1-2 DAYS, DEPENDING ON CLINICAL CONDITION.   Epifanio Lesches M.D on 03/10/2019 at 12:24 PM  Between 7am to 6pm - Pager - [phone]  After 6pm go to [email or web address] - password [email or web address] [facility]  Office  [phone]  CC: Primary care physician; Redmond School, MD   Note: This dictation was prepared with Dragon dictation along with smaller phrase technology. Any transcriptional errors that result from this process are unintentional.

## 2019-03-10 NOTE — TOC Initial Note (Signed)
Transition of Care [facility]) - Initial/Assessment Note    Patient Details  Name: Summer Cook MRN: [id number] Date of Birth: 08-30-1966  Transition of Care [facility]) CM/SW Contact:    Beverly Sessions, RN Phone Number: 03/10/2019, 4:18 PM  Clinical Narrative:                 Patient admitted with SOB after missing HD x3 sessions.  Patient states that she did not feel like she needed it so she didn't go.  She states that the MD has reviewed with her the importance of maintaining her schedule.   Patient states she lives at home with her daughter and aunt.  [facility].  Patient denies issues obtaining medications.  Patient states that she is independent.  At baseline is able to drive herself, but that her daughter is also available to provide transportation.   No DME in the home.   RNCM following for discharge planning needs   Expected Discharge Plan: Home/Self Care     Patient Goals and CMS Choice        Expected Discharge Plan and Services Expected Discharge Plan: Home/Self Care       Living arrangements for the past 2 months: Single Family Home                                      Prior Living Arrangements/Services Living arrangements for the past 2 months: Single Family Home Lives with:: Other (Comment), Adult Children(aunt) Patient language and need for interpreter reviewed:: Yes        Need for Family Participation in Patient Care: Yes (Comment) Care giver support system in place?: Yes (comment)   Criminal Activity/Legal Involvement Pertinent to Current Situation/Hospitalization: No - Comment as needed  Activities of Daily Living Home Assistive Devices/Equipment: None ADL Screening (condition at time of admission) Patient's cognitive ability adequate to safely complete daily activities?: Yes Is the patient deaf or have difficulty hearing?: No Does the patient have difficulty seeing, even when wearing glasses/contacts?: No Does the patient  have difficulty concentrating, remembering, or making decisions?: No Patient able to express need for assistance with ADLs?: Yes Does the patient have difficulty dressing or bathing?: No Independently performs ADLs?: Yes (appropriate for developmental age) Communication: Independent Dressing (OT): Independent Grooming: Independent Feeding: Independent Bathing: Independent Toileting: Independent In/Out Bed: Independent Walks in Home: Independent Does the patient have difficulty walking or climbing stairs?: No Weakness of Legs: None Weakness of Arms/Hands: None  Permission Sought/Granted                  Emotional Assessment       Orientation: : Oriented to Self, Oriented to Place, Oriented to  Time, Oriented to Situation Alcohol / Substance Use: Not Applicable Psych Involvement: No (comment)  Admission diagnosis:  ESRD  Patient Active Problem List   Diagnosis Date Noted  . ESRD (end stage renal disease) ([place]) 03/09/2019  . Acute renal failure superimposed on stage 3 chronic kidney disease ([place]) 01/11/2019  . Renal hematoma, left   . Acute on chronic systolic congestive heart failure ([place])   . Iron deficiency anemia   . Fever 01/07/2019  . CKD (chronic kidney disease), stage V ([place]) 01/07/2019  . Cough 01/07/2019  . AKI (acute kidney injury) ([place]) 12/31/2018  . Chest pain 04/29/2018  . Unstable angina ([place]) 05/29/2017  . CAD (coronary artery disease): total distal  RCA EF normal (05/20/17) 05/25/2017  . UTI (urinary tract infection) 05/25/2017  . Chest pain with moderate risk for cardiac etiology 05/23/2017  . Mixed hyperlipidemia   . NSTEMI (non-ST elevated myocardial infarction) ([place]) 05/20/2017  . Diabetes mellitus type 2, uncomplicated ([place]) 70/14/1030  . History of stroke   . Chronic back pain 12/23/2015  . Hypotension 02/16/2015  . Asthma, chronic 02/16/2015  . Anxiety state 02/16/2015  . Stage 3 chronic kidney disease ([place]) 04/28/2014  . Nausea with  vomiting 01/25/2013  . Abnormal LFTs 01/25/2013  . Orthostatic hypotension 01/25/2013  . Rectal bleeding 12/26/2012  . History of pulmonary embolism 05/03/2012  . Acute respiratory failure with hypoxia ([place]) 06/05/2011  . Hemorrhoids 06/04/2011  . 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 Immunosuppressed status ([place]) 06/04/2011  . Depression 06/04/2011  . Obesity 06/04/2011  . Essential hypertension 03/18/2011  . Idiopathic thrombocytopenic purpura ([place]) 03/18/2011   PCP:  Redmond School, MD Pharmacy:   [id number] [place], [address] [place] [address] [place] [address] Phone: [phone] Fax: [place], [place] [address] [place] [address] Phone: [phone] Fax: [phone]     Social Determinants of Health (SDOH) Interventions    Readmission Risk Interventions Readmission Risk Prevention Plan 03/10/2019  Transportation Screening Complete  Medication Review (RN Care Manager) Referral to Pharmacy  Palliative Care Screening Not Applicable  Some recent data might be hidden

## 2019-03-10 NOTE — Progress Notes (Signed)
Patient having a difficult time breathing. Order received for lasix 80mg  iv once

## 2019-03-10 NOTE — Progress Notes (Signed)
HD Tx Start   03/10/19 1845  Vital Signs  Pulse Rate 84  Resp 16  BP (!) 142/89  During Hemodialysis Assessment  Blood Flow Rate (mL/min) 250 mL/min  Arterial Pressure (mmHg) -80 mmHg  Venous Pressure (mmHg) 100 mmHg  Transmembrane Pressure (mmHg) 40 mmHg  Ultrafiltration Rate (mL/min) 600 mL/min (600 mL per HOUR)  Dialysate Flow Rate (mL/min) 500 ml/min  Conductivity: Machine  14  HD Safety Checks Performed Yes  Dialysis Fluid Bolus Normal Saline  Bolus Amount (mL) 250 mL  Intra-Hemodialysis Comments Tx initiated  Hemodialysis Catheter Right Internal jugular Double-lumen;Permanent  Placement Date/Time: 01/10/19 1349   Time Out: Correct procedure;Correct patient;Correct site  Maximum sterile barrier precautions: Hand hygiene;Cap;Mask;Sterile gown;Large sterile sheet;Sterile gloves  Site Prep: Chlorhexidine;Skin Prep Completely Dr...  Site Condition No complications  Blue Lumen Status Infusing  Red Lumen Status Infusing

## 2019-03-10 NOTE — TOC Progression Note (Signed)
Transition of Care [facility]) - Progression Note    Patient Details  Name: Summer Cook MRN: [id number] Date of Birth: 05/31/1966  Transition of [facility] [place] [facility]) CM/SW Contact  Beverly Sessions, RN Phone Number: 03/10/2019, 1:31 PM  Clinical Narrative:     Patient has not received hemodialysis since March 02, 2019-patient states that she did not think she is needed dialysis any further.  Per MD patient is requesting to switch HD centers.  [facility] dialysis liaison notified of admission, and is aware she wants to switch        Expected Discharge Plan and Services                                                 Social Determinants of Health (SDOH) Interventions    Readmission Risk Interventions No flowsheet data found.

## 2019-03-10 NOTE — Progress Notes (Signed)
Post HD Assessment  1.0 liter removed, tolerated tx well and reports "breathing better" post tx as well.  Plan for HD tmro to resume normal schedule.    03/10/19 2125  Neurological  Level of Consciousness Alert  Orientation Level Oriented X4  Respiratory  Respiratory Pattern Regular  Chest Assessment Chest expansion symmetrical  Bilateral Breath Sounds Diminished;Clear  Cardiac  Pulse Regular  Heart Sounds S1, S2  ECG Monitor Yes  Cardiac Rhythm NSR  Vascular  R Radial Pulse +2  L Radial Pulse +2  Edema Generalized  Integumentary  Integumentary (WDL) X  Skin Color Appropriate for ethnicity  Musculoskeletal  Musculoskeletal (WDL) WDL  Gastrointestinal  Bowel Sounds Assessment Active  GU Assessment  Genitourinary (WDL) X  Genitourinary Symptoms  (HD pt - oliguria)  Urine Characteristics  Urine Color Yellow/straw  Urine Appearance Clear  Psychosocial  Psychosocial (WDL) WDL

## 2019-03-10 NOTE — Progress Notes (Signed)
HD Tx End  1.0 liter removed, tolerated tx well and reports "breathing better" post tx as well.  Plan for HD tmro to resume normal schedule.     03/10/19 2115  Vital Signs  Temp 98.4 F (36.9 C)  Temp Source Oral  Pulse Rate 87  Pulse Rate Source Dinamap  Resp 18  BP 139/82  BP Location Left Arm  BP Method Automatic  Patient Position (if appropriate) Lying  Oxygen Therapy  SpO2 96 %  O2 Device Room Air  Pain Assessment  Pain Scale 0-10  Pain Score 0  Dialysis Weight  Weight 81.5 kg  Type of Weight Post-Dialysis  During Hemodialysis Assessment  Blood Flow Rate (mL/min) 200 mL/min  Arterial Pressure (mmHg) -120 mmHg  Venous Pressure (mmHg) 100 mmHg  Transmembrane Pressure (mmHg) 50 mmHg  Ultrafiltration Rate (mL/min) 600 mL/min  Dialysate Flow Rate (mL/min) 500 ml/min  Conductivity: Machine  14  HD Safety Checks Performed Yes  Dialysis Fluid Bolus Normal Saline  Bolus Amount (mL) 250 mL  Intra-Hemodialysis Comments Tx completed  Post-Hemodialysis Assessment  Rinseback Volume (mL) 250 mL  Dialyzer Clearance Lightly streaked  Duration of HD Treatment -hour(s) 2.5 hour(s)  Hemodialysis Intake (mL) 500 mL  UF Total -Machine (mL) 1500 mL  Net UF (mL) 1000 mL  Tolerated HD Treatment Yes  Hemodialysis Catheter Right Internal jugular Double-lumen;Permanent  Placement Date/Time: 01/10/19 1349   Time Out: Correct procedure;Correct patient;Correct site  Maximum sterile barrier precautions: Hand hygiene;Cap;Mask;Sterile gown;Large sterile sheet;Sterile gloves  Site Prep: Chlorhexidine;Skin Prep Completely Dr...  Site Condition No complications  Blue Lumen Status Flushed;Capped (Central line);Heparin locked  Red Lumen Status Flushed;Capped (Central line);Heparin locked  Purple Lumen Status N/A  Catheter fill solution Heparin 1000 units/ml  Dressing Type Gauze/Drain sponge  Dressing Status Clean;Dry;Intact  Drainage Description None  Dressing Change Due 03/11/19  Post  treatment catheter status Capped and Clamped

## 2019-03-11 LAB — CBC
HCT: 32.6 % — ABNORMAL LOW (ref 36.0–46.0)
Hemoglobin: 10.3 g/dL — ABNORMAL LOW (ref 12.0–15.0)
MCH: 30.2 pg (ref 26.0–34.0)
MCHC: 31.6 g/dL (ref 30.0–36.0)
MCV: 95.6 fL (ref 80.0–100.0)
Platelets: 156 10*3/uL (ref 150–400)
RBC: 3.41 MIL/uL — ABNORMAL LOW (ref 3.87–5.11)
RDW: 16.6 % — ABNORMAL HIGH (ref 11.5–15.5)
WBC: 5.6 10*3/uL (ref 4.0–10.5)
nRBC: 0 % (ref 0.0–0.2)

## 2019-03-11 LAB — BASIC METABOLIC PANEL
Anion gap: 9 (ref 5–15)
BUN: 47 mg/dL — ABNORMAL HIGH (ref 6–20)
CO2: 26 mmol/L (ref 22–32)
Calcium: 8.4 mg/dL — ABNORMAL LOW (ref 8.9–10.3)
Chloride: 105 mmol/L (ref 98–111)
Creatinine, Ser: 4.3 mg/dL — ABNORMAL HIGH (ref 0.44–1.00)
GFR calc Af Amer: 13 mL/min — ABNORMAL LOW (ref >60)
GFR calc non Af Amer: 11 mL/min — ABNORMAL LOW (ref >60)
Glucose, Bld: 94 mg/dL (ref 70–99)
Potassium: 4.5 mmol/L (ref 3.5–5.1)
Sodium: 140 mmol/L (ref 135–145)

## 2019-03-11 LAB — HEPATITIS B SURFACE ANTIGEN: Hepatitis B Surface Ag: NEGATIVE

## 2019-03-11 LAB — MAGNESIUM: Magnesium: 2.3 mg/dL (ref 1.7–2.4)

## 2019-03-11 LAB — NOVEL CORONAVIRUS, NAA (HOSP ORDER, SEND-OUT TO REF LAB; TAT 18-24 HRS): SARS-CoV-2, NAA: NOT DETECTED

## 2019-03-11 LAB — PHOSPHORUS
Phosphorus: 5.6 mg/dL — ABNORMAL HIGH (ref 2.5–4.6)
Phosphorus: 5 mg/dL — ABNORMAL HIGH (ref 2.5–4.6)

## 2019-03-11 LAB — PARATHYROID HORMONE, INTACT (NO CA): PTH: 111 pg/mL — ABNORMAL HIGH (ref 15–65)

## 2019-03-11 MED ORDER — EPOETIN ALFA 10000 UNIT/ML IJ SOLN: 4000.0000 [IU] | INTRAMUSCULAR | Status: DC

## 2019-03-11 NOTE — Progress Notes (Signed)
[facility] NAME: Summer Cook    MR#:  [id number]  DATE OF BIRTH:  12/31/1965  SUBJECTIVE:  CHIEF COMPLAINT:  No chief complaint on file.  No new complaint this morning.  No shortness of breath.  Patient scheduled for hemodialysis again today.  REVIEW OF SYSTEMS:  ROS ROS CONSTITUTIONAL: No fever, fatigue or weakness.  EYES: No blurred or double vision.  EARS, NOSE, AND THROAT: No tinnitus or ear pain.  RESPIRATORY:  No shortness of breath, pedal edema  CARDIOVASCULAR: No chest pain, orthopnea, edema.  GASTROINTESTINAL: No nausea, vomiting, diarrhea or abdominal pain.  GENITOURINARY: No dysuria, hematuria.  ENDOCRINE: No polyuria, nocturia,  HEMATOLOGY: No anemia, easy bruising or bleeding SKIN: No rash or lesion. MUSCULOSKELETAL: No joint pain or arthritis.   NEUROLOGIC: No tingling, numbness, weakness.  PSYCHIATRY: No anxiety or depression.   DRUG ALLERGIES:   Allergies  Allergen Reactions  . Dilaudid [Hydromorphone Hcl] Anxiety and Other (See Comments)    Causes hallucinations, anxiety, and panic per patient  . [place] Venom Swelling    Swelling at injection site  . Adhesive [Tape] Rash    Please use paper tape  . Brintellix [Vortioxetine] Itching  . Cymbalta [Duloxetine Hcl] Itching  . Penicillins Itching    Did it involve swelling of the face/tongue/throat, SOB, or low BP? No Did it involve sudden or severe rash/hives, skin peeling, or any reaction on the inside of your mouth or nose? No Did you need to seek medical attention at a hospital or doctor's office? No When did it last happen?53 yrs old If all above answers are "NO", may proceed with cephalosporin use.   VITALS:  Blood pressure 125/87, pulse 78, temperature 98.9 F (37.2 C), temperature source Oral, resp. rate 16, height 5\' 5"  (1.651 m), weight 81.5 kg, last menstrual period 08/13/2011, SpO2 94 %. PHYSICAL EXAMINATION:  Physical Exam    GENERAL:  53 y.o.-year-old patient lying in the bed with no acute distress.  EYES: Pupils equal, round, reactive to light and accommodation. No scleral icterus. Extraocular muscles intact.  HEENT: Head atraumatic, normocephalic. Oropharynx and nasopharynx clear.  NECK:  Supple, no jugular venous distention. No thyroid enlargement, no tenderness.  LUNGS: Managed breath sounds bilaterally. CARDIOVASCULAR: S1, S2 normal. No murmurs, rubs, or gallops.  ABDOMEN: Soft, nontender, nondistended. Bowel sounds present. No organomegaly or mass.  EXTREMITIES: Bilateral leg edema  NEUROLOGIC: Cranial nerves II through XII are intact. Muscle strength 5/5 in all extremities. Sensation intact. Gait not checked.  PSYCHIATRIC: The patient is alert and oriented x 3.  SKIN: No obvious rash, lesion, or ulcer.   LABORATORY PANEL:  Female CBC Recent Labs  Lab 03/11/19 0805  WBC 5.6  HGB 10.3*  HCT 32.6*  PLT 156   ------------------------------------------------------------------------------------------------------------------ Chemistries  Recent Labs  Lab 03/09/19 1951  03/11/19 0805  NA 131*   < > 140  K 3.9   < > 4.5  CL 106   < > 105  CO2 17*   < > 26  GLUCOSE 92   < > 94  BUN 72*   < > 47*  CREATININE 4.82*   < > 4.30*  CALCIUM 7.9*   < > 8.4*  MG  --   --  2.3  AST 21  --   --   ALT 14  --   --   ALKPHOS 64  --   --   BILITOT 0.4  --   --    < > =  values in this interval not displayed.   RADIOLOGY:  No results found. ASSESSMENT AND PLAN:     53 year old female patient with history of ESRD on dialysis Tuesday, Thursday, Saturday missed hemodialysis sessions as she felt she did not need it, last dialysis was on June 4 comes in because of worsening shortness of breath, pedal edema along with cough..  1. Acute shortness of breath secondary to missed hemodialysis sessions:  Nephrology service following.  Patient had hemodialysis yesterday.  Scheduled for hemodialysis again today.   Patient wants to to go to different outpatient dialysis place.  Dialysis liaison to arrange that.  2.  Hypertension:  Blood pressure better controlled.    DVT prophylaxis ; heparin  All the records are reviewed and case discussed with Care Management/Social Worker. Management plans discussed with the patient, family and they are in agreement.  CODE STATUS: Full Code  TOTAL TIME TAKING CARE OF THIS PATIENT: 27 minutes.   More than 50% of the time was spent in counseling/coordination of care: YES  POSSIBLE D/C IN 2 DAYS, DEPENDING ON CLINICAL CONDITION.   Erle Guster M.D on 03/11/2019 at 1:18 PM  Between 7am to 6pm - Pager - [phone]  After 6pm go to [email or web address] - Proofreader  [facility]  Office  [phone]  CC: Primary care physician; [employer], MD  Note: This dictation was prepared with Dragon dictation along with smaller phrase technology. Any transcriptional errors that result from this process are unintentional.

## 2019-03-11 NOTE — Progress Notes (Signed)
PRE-HD   03/11/19 1700  Vital Signs  Temp 98.8 F (37.1 C)  Temp Source Oral  Pulse Rate 80  Pulse Rate Source Monitor  Resp 18  BP 128/65  BP Location Left Arm  BP Method Automatic  Patient Position (if appropriate) Lying  Oxygen Therapy  SpO2 98 %  O2 Device Room Air  Pain Assessment  Pain Scale 0-10  Pain Score 0  Dialysis Weight  Weight 80.8 kg  Type of Weight Pre-Dialysis  Time-Out for Hemodialysis  What Procedure? hd  Pt Identifiers(min of two) First/Last Name;MRN/Account#  Correct Site? Yes  Correct Side? Yes  Correct Procedure? Yes  Consents Verified? Yes  Rad Studies Available? N/A  Safety Precautions Reviewed? Yes  Education / Care Plan  Dialysis Education Provided Yes  Documented Education in Care Plan Yes  Fistula / Graft Right Upper arm Arteriovenous vein graft  Placement Date/Time: 01/30/19 0834   Placed prior to admission: No  Orientation: Right  Access Location: Upper arm  Access Type: Arteriovenous vein graft  Expiration Date: 12/13/22  Site Condition No complications  Fistula / Graft Assessment Present;Thrill;Bruit  Status Deaccessed  Hemodialysis Catheter Right Internal jugular Double-lumen;Permanent  Placement Date/Time: 01/10/19 1349   Time Out: Correct procedure;Correct patient;Correct site  Maximum sterile barrier precautions: Hand hygiene;Cap;Mask;Sterile gown;Large sterile sheet;Sterile gloves  Site Prep: Chlorhexidine;Skin Prep Completely Dr...  Site Condition No complications  Blue Lumen Status Flushed;Blood return noted  Red Lumen Status Flushed;Blood return noted  Purple Lumen Status N/A  Dressing Type Occlusive  Dressing Status Clean;Dry;Intact;Dressing changed  Interventions New dressing;Dressing changed  Dressing Change Due 03/18/19

## 2019-03-11 NOTE — Progress Notes (Signed)
Machine check/ hep

## 2019-03-11 NOTE — Progress Notes (Signed)
[facility]  ROUNDING NOTE   Subjective:  Patient underwent dialysis treatment yesterday. Tolerated well. Due for another dialysis session today. Overall starting to feel better.   Objective:  Vital signs in last 24 hours:  Temp:  [98.4 F (36.9 C)-98.9 F (37.2 C)] 98.9 F (37.2 C) (06/13 1233) Pulse Rate:  [78-89] 78 (06/13 1233) Resp:  [16-18] 16 (06/13 0614) BP: (125-150)/(80-98) 125/87 (06/13 1233) SpO2:  [94 %-96 %] 94 % (06/13 1233) Weight:  [81.5 kg-82.5 kg] 81.5 kg (06/13 0203)  Weight change: 0 kg Filed Weights   03/10/19 1830 03/10/19 2115 03/11/19 0203  Weight: 82.5 kg 81.5 kg 81.5 kg    Intake/Output: I/O last 3 completed shifts: In: -  Out: 2750 [Urine:1750; Other:1000]   Intake/Output this shift:  Total I/O In: -  Out: 300 [Urine:300]  Physical Exam: General: No acute distress  Head: Normocephalic, atraumatic. Moist oral mucosal membranes  Eyes: Anicteric  Neck: Supple, trachea midline  Lungs:  Basilar rales, normal effort  Heart: S1S2 no rubs  Abdomen:  Soft, nontender, bowel sounds present  Extremities: 1+ peripheral edema.  Neurologic: Awake, alert, following commands  Skin: No lesions  Access: IJ PermCath in place    Basic Metabolic Panel: Recent Labs  Lab 03/09/19 1951 03/10/19 0524 03/10/19 1542 03/11/19 0805 03/11/19 1138  NA 131* 137  --  140  --   K 3.9 4.6  --  4.5  --   CL 106 111  --  105  --   CO2 17* 19*  --  26  --   GLUCOSE 92 99  --  94  --   BUN 72* 73*  --  47*  --   CREATININE 4.82* 5.03*  --  4.30*  --   CALCIUM 7.9* 8.1*  --  8.4*  --   MG  --   --   --  2.3  --   PHOS  --   --  7.1* 5.6* 5.0*    Liver Function Tests: Recent Labs  Lab 03/09/19 1951  AST 21  ALT 14  ALKPHOS 64  BILITOT 0.4  PROT 6.3*  ALBUMIN 3.2*   No results for input(s): LIPASE, AMYLASE in the last 168 hours. No results for input(s): AMMONIA in the last 168 hours.  CBC: Recent Labs  Lab 03/09/19 1951  03/11/19 0805  WBC 6.0 5.6  NEUTROABS 3.7  --   HGB 9.7* 10.3*  HCT 30.6* 32.6*  MCV 95.3 95.6  PLT 138* 156    Cardiac Enzymes: No results for input(s): CKTOTAL, CKMB, CKMBINDEX, TROPONINI in the last 168 hours.  BNP: Invalid input(s): POCBNP  CBG: No results for input(s): GLUCAP in the last 168 hours.  Microbiology: Results for orders placed or performed during the hospital encounter of 03/09/19  Novel Coronavirus,NAA,(SEND-OUT TO REF LAB - TAT 24-48 hrs); Hosp Order     Status: None   Collection Time: 03/09/19  7:50 PM   Specimen: Nasopharyngeal Swab; Respiratory  Result Value Ref Range Status   SARS-CoV-2, NAA NOT DETECTED NOT DETECTED Final    Comment: (NOTE) This test was developed and its performance characteristics determined by [employer]. This test has not been FDA cleared or approved. This test has been authorized by FDA under an Emergency Use Authorization (EUA). This test is only authorized for the duration of time the declaration that circumstances exist justifying the authorization of the emergency use of in vitro diagnostic tests for detection of SARS-CoV-2 virus and/or diagnosis  of COVID-19 infection under section 564(b)(1) of the Act, 21 U.S.C. 383ANV-9(T)(6), unless the authorization is terminated or revoked sooner. When diagnostic testing is negative, the possibility of a false negative result should be considered in the context of a patient's recent exposures and the presence of clinical signs and symptoms consistent with COVID-19. An individual without symptoms of COVID-19 and who is not shedding SARS-CoV-2 virus would expect to have a negative (not detected) result in this assay. Performed  At: [facility] [address] [place], [place] [id number] Rush Farmer MD HF:[id number]    [place]  Final    Comment: Performed at [facility], [address] [place],  [address]   *Note: Due  to a large number of results and/or encounters for the requested time period, some results have not been displayed. A complete set of results can be found in Results Review.    Coagulation Studies: Recent Labs    03/09/19 1951  LABPROT 13.5  INR 1.0    Urinalysis: No results for input(s): COLORURINE, LABSPEC, PHURINE, GLUCOSEU, HGBUR, BILIRUBINUR, KETONESUR, PROTEINUR, UROBILINOGEN, NITRITE, LEUKOCYTESUR in the last 72 hours.  Invalid input(s): APPERANCEUR    Imaging: No results found.   Medications:   . sodium chloride    . sodium chloride    . sodium chloride     . albuterol  2 puff Inhalation Q6H  . aspirin EC  81 mg Oral Daily  . atorvastatin  40 mg Oral q1800  . Chlorhexidine Gluconate Cloth  6 each Topical Q0600  . diclofenac sodium  2 g Topical QID  . heparin  5,000 Units Subcutaneous Q8H  . hydrALAZINE  50 mg Oral TID  . isosorbide dinitrate  10 mg Oral BID  . metoprolol succinate  50 mg Oral Daily  . pantoprazole  40 mg Oral Daily  . sertraline  100 mg Oral Daily  . sodium chloride flush  3 mL Intravenous Q12H  . venlafaxine  37.5 mg Oral BID  . vitamin B-12  500 mcg Oral Daily   sodium chloride, sodium chloride, sodium chloride, acetaminophen, albuterol, ALPRAZolam, alteplase, heparin, HYDROcodone-acetaminophen, lidocaine (PF), lidocaine-prilocaine, loratadine, nitroGLYCERIN, ondansetron **OR** ondansetron (ZOFRAN) IV, pentafluoroprop-tetrafluoroeth, polyethylene glycol, sodium chloride flush  Assessment/ Plan:  53 y.o. 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  1.  ESRD on HD.  Patient had missed at least 3 dialysis treatments as oupt.   -Patient underwent hemodialysis yesterday.  She  will be due for another dialysis session today.  Thereafter next treatment will be scheduled for Tuesday if still here.  2.  Hypertension.  Blood pressure now under good control.  Therefore we will continue current doses of hydralazine, isosorbide dinitrate, and metoprolol.  3.  Anemia chronic kidney disease.  Start the patient on Epogen 4000 IV with dialysis.  4.  Secondary hyperparathyroidism.  Phosphorus down to 5.0.  Continue to monitor closely.   LOS: 2 Nikeria Kalman 6/13/20203:15 PM

## 2019-03-11 NOTE — Progress Notes (Signed)
HD START   03/11/19 1715  Vital Signs  Temp 98.8 F (37.1 C)  Temp Source Oral  Pulse Rate 81  Pulse Rate Source Monitor  Resp 16  BP 131/86  BP Location Left Arm  BP Method Automatic  Patient Position (if appropriate) Lying  Oxygen Therapy  SpO2 97 %  O2 Device Room Air  During Hemodialysis Assessment  Blood Flow Rate (mL/min) 200 mL/min  Arterial Pressure (mmHg) -80 mmHg  Venous Pressure (mmHg) 50 mmHg  Transmembrane Pressure (mmHg) 60 mmHg  Ultrafiltration Rate (mL/min) 670 mL/min  Dialysate Flow Rate (mL/min) 600 ml/min  Conductivity: Machine  13.8  HD Safety Checks Performed Yes  Dialysis Fluid Bolus Normal Saline  Bolus Amount (mL) 250 mL  Intra-Hemodialysis Comments Tx initiated (cvc care per policy)

## 2019-03-12 LAB — BASIC METABOLIC PANEL
Anion gap: 11 (ref 5–15)
BUN: 25 mg/dL — ABNORMAL HIGH (ref 6–20)
CO2: 26 mmol/L (ref 22–32)
Calcium: 8.5 mg/dL — ABNORMAL LOW (ref 8.9–10.3)
Chloride: 100 mmol/L (ref 98–111)
Creatinine, Ser: 2.91 mg/dL — ABNORMAL HIGH (ref 0.44–1.00)
GFR calc Af Amer: 20 mL/min — ABNORMAL LOW (ref >60)
GFR calc non Af Amer: 18 mL/min — ABNORMAL LOW (ref >60)
Glucose, Bld: 91 mg/dL (ref 70–99)
Potassium: 3.8 mmol/L (ref 3.5–5.1)
Sodium: 137 mmol/L (ref 135–145)

## 2019-03-12 LAB — CBC
HCT: 33.1 % — ABNORMAL LOW (ref 36.0–46.0)
Hemoglobin: 10.4 g/dL — ABNORMAL LOW (ref 12.0–15.0)
MCH: 29.6 pg (ref 26.0–34.0)
MCHC: 31.4 g/dL (ref 30.0–36.0)
MCV: 94.3 fL (ref 80.0–100.0)
Platelets: 161 10*3/uL (ref 150–400)
RBC: 3.51 MIL/uL — ABNORMAL LOW (ref 3.87–5.11)
RDW: 16.1 % — ABNORMAL HIGH (ref 11.5–15.5)
WBC: 7.6 10*3/uL (ref 4.0–10.5)
nRBC: 0 % (ref 0.0–0.2)

## 2019-03-12 LAB — URINALYSIS, ROUTINE W REFLEX MICROSCOPIC
Bacteria, UA: NONE SEEN
Bilirubin Urine: NEGATIVE
Glucose, UA: NEGATIVE mg/dL
Ketones, ur: NEGATIVE mg/dL
Nitrite: NEGATIVE
Protein, ur: 100 mg/dL — AB
Specific Gravity, Urine: 1.008 (ref 1.005–1.030)
WBC, UA: >50 WBC/hpf — ABNORMAL HIGH (ref 0–5)
pH: 8 (ref 5.0–8.0)

## 2019-03-12 LAB — MAGNESIUM: Magnesium: 1.9 mg/dL (ref 1.7–2.4)

## 2019-03-12 LAB — PHOSPHORUS: Phosphorus: 3.5 mg/dL (ref 2.5–4.6)

## 2019-03-12 NOTE — Progress Notes (Signed)
[facility]  ROUNDING NOTE   Subjective:  Patient significantly improved with ongoing dialysis. Outpatient dialysis center placement still pending.   Objective:  Vital signs in last 24 hours:  Temp:  [98.6 F (37 C)-99.2 F (37.3 C)] 99.2 F (37.3 C) (06/14 1135) Pulse Rate:  [79-88] 83 (06/14 1135) Resp:  [16-28] 17 (06/14 0434) BP: (121-135)/(65-97) 128/96 (06/14 1135) SpO2:  [94 %-98 %] 96 % (06/14 1135) Weight:  [79.5 kg-80.8 kg] 79.5 kg (06/14 0509)  Weight change: -1.7 kg Filed Weights   03/11/19 0203 03/11/19 1700 03/12/19 0509  Weight: 81.5 kg 80.8 kg 79.5 kg    Intake/Output: I/O last 3 completed shifts: In: 3 [I.V.:3] Out: 3600 [Urine:850; Other:2750]   Intake/Output this shift:  Total I/O In: 340 [P.O.:340] Out: 600 [Urine:600]  Physical Exam: General: No acute distress  Head: Normocephalic, atraumatic. Moist oral mucosal membranes  Eyes: Anicteric  Neck: Supple, trachea midline  Lungs:  Basilar rales, normal effort  Heart: S1S2 no rubs  Abdomen:  Soft, nontender, bowel sounds present  Extremities: 1+ peripheral edema.  Neurologic: Awake, alert, following commands  Skin: No lesions  Access: IJ PermCath in place    Basic Metabolic Panel: Recent Labs  Lab 03/09/19 1951 03/10/19 0524 03/10/19 1542 03/11/19 0805 03/11/19 1138 03/12/19 0406  NA 131* 137  --  140  --  137  K 3.9 4.6  --  4.5  --  3.8  CL 106 111  --  105  --  100  CO2 17* 19*  --  26  --  26  GLUCOSE 92 99  --  94  --  91  BUN 72* 73*  --  47*  --  25*  CREATININE 4.82* 5.03*  --  4.30*  --  2.91*  CALCIUM 7.9* 8.1*  --  8.4*  --  8.5*  MG  --   --   --  2.3  --  1.9  PHOS  --   --  7.1* 5.6* 5.0* 3.5    Liver Function Tests: Recent Labs  Lab 03/09/19 1951  AST 21  ALT 14  ALKPHOS 64  BILITOT 0.4  PROT 6.3*  ALBUMIN 3.2*   No results for input(s): LIPASE, AMYLASE in the last 168 hours. No results for input(s): AMMONIA in the last 168  hours.  CBC: Recent Labs  Lab 03/09/19 1951 03/11/19 0805 03/12/19 0406  WBC 6.0 5.6 7.6  NEUTROABS 3.7  --   --   HGB 9.7* 10.3* 10.4*  HCT 30.6* 32.6* 33.1*  MCV 95.3 95.6 94.3  PLT 138* 156 161    Cardiac Enzymes: No results for input(s): CKTOTAL, CKMB, CKMBINDEX, TROPONINI in the last 168 hours.  BNP: Invalid input(s): POCBNP  CBG: No results for input(s): GLUCAP in the last 168 hours.  Microbiology: Results for orders placed or performed during the hospital encounter of 03/09/19  Novel Coronavirus,NAA,(SEND-OUT TO REF LAB - TAT 24-48 hrs); Hosp Order     Status: None   Collection Time: 03/09/19  7:50 PM   Specimen: Nasopharyngeal Swab; Respiratory  Result Value Ref Range Status   SARS-CoV-2, NAA NOT DETECTED NOT DETECTED Final    Comment: (NOTE) This test was developed and its performance characteristics determined by [employer]. This test has not been FDA cleared or approved. This test has been authorized by FDA under an Emergency Use Authorization (EUA). This test is only authorized for the duration of time the declaration that circumstances exist justifying the authorization  of the emergency use of in vitro diagnostic tests for detection of SARS-CoV-2 virus and/or diagnosis of COVID-19 infection under section 564(b)(1) of the Act, 21 U.S.C. 073XTG-6(Y)(6), unless the authorization is terminated or revoked sooner. When diagnostic testing is negative, the possibility of a false negative result should be considered in the context of a patient's recent exposures and the presence of clinical signs and symptoms consistent with COVID-19. An individual without symptoms of COVID-19 and who is not shedding SARS-CoV-2 virus would expect to have a negative (not detected) result in this assay. Performed  At: [facility] [address] [place], [place] [id number] Rush Farmer MD JJ:[id number]    [place]  Final    Comment:  Performed at [facility], [address] [place], [place] [address]   *Note: Due to a large number of results and/or encounters for the requested time period, some results have not been displayed. A complete set of results can be found in Results Review.    Coagulation Studies: Recent Labs    03/09/19 1951  LABPROT 13.5  INR 1.0    Urinalysis: No results for input(s): COLORURINE, LABSPEC, PHURINE, GLUCOSEU, HGBUR, BILIRUBINUR, KETONESUR, PROTEINUR, UROBILINOGEN, NITRITE, LEUKOCYTESUR in the last 72 hours.  Invalid input(s): APPERANCEUR    Imaging: No results found.   Medications:   . sodium chloride     . albuterol  2 puff Inhalation Q6H  . aspirin EC  81 mg Oral Daily  . atorvastatin  40 mg Oral q1800  . Chlorhexidine Gluconate Cloth  6 each Topical Q0600  . diclofenac sodium  2 g Topical QID  . [START ON 03/14/2019] epoetin (EPOGEN/PROCRIT) injection  4,000 Units Intravenous Q T,Th,Sa-HD  . heparin  5,000 Units Subcutaneous Q8H  . hydrALAZINE  50 mg Oral TID  . isosorbide dinitrate  10 mg Oral BID  . metoprolol succinate  50 mg Oral Daily  . pantoprazole  40 mg Oral Daily  . sertraline  100 mg Oral Daily  . sodium chloride flush  3 mL Intravenous Q12H  . venlafaxine  37.5 mg Oral BID  . vitamin B-12  500 mcg Oral Daily   sodium chloride, acetaminophen, albuterol, ALPRAZolam, HYDROcodone-acetaminophen, loratadine, nitroGLYCERIN, ondansetron **OR** ondansetron (ZOFRAN) IV, polyethylene glycol, sodium chloride flush  Assessment/ Plan:  53 y.o. 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  1.  ESRD on HD.  Patient had missed at least 3 dialysis treatments as oupt.   -Patient had hemodialysis  yesterday.  Feeling much better after reinitiation of dialysis.  We will reevaluate the patient for dialysis treatment tomorrow but suspect that she will be TTS schedule as an outpatient.  Outpatient dialysis center placement still pending at [facility] dialysis.  2.  Hypertension.  Continue hydralazine, isosorbide dinitrate, metoprolol.  3.  Anemia chronic kidney disease.  Continue Epogen 4000 IV with dialysis.  4.  Secondary hyperparathyroidism.  Phosphorus down to 3.5 and acceptable.  Continue to monitor trend as an outpatient.   LOS: 3 Aurie Harroun 6/14/20202:37 PM

## 2019-03-12 NOTE — Progress Notes (Signed)
[facility] NAME: Summer Cook    MR#:  [id number]  DATE OF BIRTH:  May 04, 1966  SUBJECTIVE:  CHIEF COMPLAINT:  No chief complaint on file.  No shortness of breath.  Nursing staff later reported patient complaining of dysuria and frequent urination.  Urinalysis requested.  REVIEW OF SYSTEMS:  ROS ROS CONSTITUTIONAL: No fever, fatigue or weakness.  EYES: No blurred or double vision.  EARS, NOSE, AND THROAT: No tinnitus or ear pain.  RESPIRATORY:  No shortness of breath, pedal edema  CARDIOVASCULAR: No chest pain, orthopnea, edema.  GASTROINTESTINAL: No nausea, vomiting, diarrhea or abdominal pain.  GENITOURINARY: Positive for dysuria, no hematuria.  ENDOCRINE: No polyuria, nocturia,  HEMATOLOGY: No anemia, easy bruising or bleeding SKIN: No rash or lesion. MUSCULOSKELETAL: No joint pain or arthritis.   NEUROLOGIC: No tingling, numbness, weakness.  PSYCHIATRY: No anxiety or depression.   DRUG ALLERGIES:   Allergies  Allergen Reactions  . Dilaudid [Hydromorphone Hcl] Anxiety and Other (See Comments)    Causes hallucinations, anxiety, and panic per patient  . Yellow Jacket Venom Swelling    Swelling at injection site  . Adhesive [Tape] Rash    Please use paper tape  . Brintellix [Vortioxetine] Itching  . Cymbalta [Duloxetine Hcl] Itching  . Penicillins Itching    Did it involve swelling of the face/tongue/throat, SOB, or low BP? No Did it involve sudden or severe rash/hives, skin peeling, or any reaction on the inside of your mouth or nose? No Did you need to seek medical attention at a hospital or doctor's office? No When did it last happen?53 yrs old If all above answers are "NO", may proceed with cephalosporin use.   VITALS:  Blood pressure (!) 128/96, pulse 83, temperature 99.2 F (37.3 C), temperature source Oral, resp. rate 17, height 5\' 5"  (1.651 m), weight 79.5 kg, last menstrual period 08/13/2011, SpO2  96 %. PHYSICAL EXAMINATION:  Physical Exam   GENERAL:  53 y.o.-year-old patient lying in the bed with no acute distress.  EYES: Pupils equal, round, reactive to light and accommodation. No scleral icterus. Extraocular muscles intact.  HEENT: Head atraumatic, normocephalic. Oropharynx and nasopharynx clear.  NECK:  Supple, no jugular venous distention. No thyroid enlargement, no tenderness.  LUNGS: Managed breath sounds bilaterally. CARDIOVASCULAR: S1, S2 normal. No murmurs, rubs, or gallops.  ABDOMEN: Soft, nontender, nondistended. Bowel sounds present. No organomegaly or mass.  EXTREMITIES: Bilateral leg edema  NEUROLOGIC: Cranial nerves II through XII are intact. Muscle strength 5/5 in all extremities. Sensation intact. Gait not checked.  PSYCHIATRIC: The patient is alert and oriented x 3.  SKIN: No obvious rash, lesion, or ulcer.   LABORATORY PANEL:  Female CBC Recent Labs  Lab 03/12/19 0406  WBC 7.6  HGB 10.4*  HCT 33.1*  PLT 161   ------------------------------------------------------------------------------------------------------------------ Chemistries  Recent Labs  Lab 03/09/19 1951  03/12/19 0406  NA 131*   < > 137  K 3.9   < > 3.8  CL 106   < > 100  CO2 17*   < > 26  GLUCOSE 92   < > 91  BUN 72*   < > 25*  CREATININE 4.82*   < > 2.91*  CALCIUM 7.9*   < > 8.5*  MG  --    < > 1.9  AST 21  --   --   ALT 14  --   --   ALKPHOS 64  --   --  BILITOT 0.4  --   --    < > = values in this interval not displayed.   RADIOLOGY:  No results found. ASSESSMENT AND PLAN:     53 year old female patient with history of ESRD on dialysis Tuesday, Thursday, Saturday missed hemodialysis sessions as she felt she did not need it, last dialysis was on June 4 comes in because of worsening shortness of breath, pedal edema along with cough..  1. Acute shortness of breath secondary to missed hemodialysis sessions:  Nephrology service following.  Patient had hemodialysis on the  last 2 consecutive days.  Next hemodialysis is on Tuesday.  Patient wants to to go to different outpatient dialysis place.  Dialysis liaison to arrange that prior to discharge.  2.  Hypertension:  Blood pressure better controlled.    3.  Dysuria Requested for urinalysis.  If positive will initiate antibiotics.  DVT prophylaxis ; heparin  All the records are reviewed and case discussed with Care Management/Social Worker. Management plans discussed with the patient, family and they are in agreement.  CODE STATUS: Full Code  TOTAL TIME TAKING CARE OF THIS PATIENT: 26 minutes.   More than 50% of the time was spent in counseling/coordination of care: YES  POSSIBLE D/C IN 2 DAYS, DEPENDING ON CLINICAL CONDITION.   Summer Cook M.D on 03/12/2019 at 1:15 PM  Between 7am to 6pm - Pager - [phone]  After 6pm go to [email or web address] - Proofreader  [facility]  Office  [phone]  CC: Primary care physician; Redmond School, MD  Note: This dictation was prepared with Dragon dictation along with smaller phrase technology. Any transcriptional errors that result from this process are unintentional.

## 2019-03-13 LAB — BASIC METABOLIC PANEL
Anion gap: 10 (ref 5–15)
BUN: 44 mg/dL — ABNORMAL HIGH (ref 6–20)
CO2: 25 mmol/L (ref 22–32)
Calcium: 8.7 mg/dL — ABNORMAL LOW (ref 8.9–10.3)
Chloride: 102 mmol/L (ref 98–111)
Creatinine, Ser: 4.27 mg/dL — ABNORMAL HIGH (ref 0.44–1.00)
GFR calc Af Amer: 13 mL/min — ABNORMAL LOW (ref >60)
GFR calc non Af Amer: 11 mL/min — ABNORMAL LOW (ref >60)
Glucose, Bld: 90 mg/dL (ref 70–99)
Potassium: 4.5 mmol/L (ref 3.5–5.1)
Sodium: 137 mmol/L (ref 135–145)

## 2019-03-13 LAB — CBC
HCT: 31.4 % — ABNORMAL LOW (ref 36.0–46.0)
Hemoglobin: 9.9 g/dL — ABNORMAL LOW (ref 12.0–15.0)
MCH: 29.7 pg (ref 26.0–34.0)
MCHC: 31.5 g/dL (ref 30.0–36.0)
MCV: 94.3 fL (ref 80.0–100.0)
Platelets: 174 10*3/uL (ref 150–400)
RBC: 3.33 MIL/uL — ABNORMAL LOW (ref 3.87–5.11)
RDW: 16.2 % — ABNORMAL HIGH (ref 11.5–15.5)
WBC: 5.2 10*3/uL (ref 4.0–10.5)
nRBC: 0 % (ref 0.0–0.2)

## 2019-03-13 LAB — HEPATITIS PANEL, ACUTE
HCV Ab: 0.2 s/co ratio (ref 0.0–0.9)
Hep A IgM: NEGATIVE
Hep B C IgM: NEGATIVE
Hepatitis B Surface Ag: NEGATIVE

## 2019-03-13 LAB — MAGNESIUM: Magnesium: 2.1 mg/dL (ref 1.7–2.4)

## 2019-03-13 MED ORDER — DIPHENHYDRAMINE HCL 50 MG/ML IJ SOLN
25.0000 mg | Freq: Three times a day (TID) | INTRAMUSCULAR | Status: DC | PRN
  Administered 2019-03-13: 25 mg via INTRAVENOUS
  Filled 2019-03-13: qty 1

## 2019-03-13 MED ORDER — METHYLPREDNISOLONE SODIUM SUCC 40 MG IJ SOLR
40.0000 mg | Freq: Two times a day (BID) | INTRAMUSCULAR | Status: DC
  Administered 2019-03-13 – 2019-03-14 (×2): 40 mg via INTRAVENOUS
  Filled 2019-03-13 (×2): qty 1

## 2019-03-13 MED ORDER — DEXTROSE 5 % IV SOLN
0.5000 g | Freq: Two times a day (BID) | INTRAVENOUS | Status: DC
  Administered 2019-03-14: 0.5 g via INTRAVENOUS
  Filled 2019-03-13 (×3): qty 0.5

## 2019-03-13 MED ORDER — FAMOTIDINE IN NACL 20-0.9 MG/50ML-% IV SOLN
20.0000 mg | INTRAVENOUS | Status: DC
  Administered 2019-03-13: 20 mg via INTRAVENOUS
  Filled 2019-03-13: qty 50

## 2019-03-13 MED ORDER — SODIUM CHLORIDE 0.9 % IV SOLN
1.0000 g | INTRAVENOUS | Status: DC
  Administered 2019-03-13: 1 g via INTRAVENOUS
  Filled 2019-03-13: qty 1

## 2019-03-13 NOTE — Clinical Social Work Note (Signed)
CSW spoke with Estill Bamberg, the Dialysis Liason, and she informed CSW that patient has a new chair time and place as documented in nephrology's note. Patient is to have dialysis today and then will have first outpatient dialysis on Wednesday.  Shela Leff MSW,LCSW [phone]

## 2019-03-13 NOTE — Care Management Important Message (Signed)
Important Message  Patient Details  Name: Summer Cook MRN: [id number] Date of Birth: [birth year]   Medicare Important Message Given:  Yes    Dannette Barbara 03/13/2019, 10:34 AM

## 2019-03-13 NOTE — Progress Notes (Signed)
[facility]  ROUNDING NOTE   Subjective:   Patient with no complaints this morning.   Dialysis for later today.   Objective:  Vital signs in last 24 hours:  Temp:  [97.7 F (36.5 C)-98 F (36.7 C)] 98 F (36.7 C) (06/15 0507) Pulse Rate:  [74-78] 74 (06/15 0507) Resp:  [16] 16 (06/15 0507) BP: (129-136)/(95) 136/95 (06/15 0507) SpO2:  [98 %] 98 % (06/15 0507) Weight:  [79.3 kg] 79.3 kg (06/15 0500)  Weight change: -1.466 kg Filed Weights   03/11/19 1700 03/12/19 0509 03/13/19 0500  Weight: 80.8 kg 79.5 kg 79.3 kg    Intake/Output: I/O last 3 completed shifts: In: 60 [P.O.:820; I.V.:3] Out: 2900 [Urine:1150; Other:1750]   Intake/Output this shift:  No intake/output data recorded.  Physical Exam: General: No acute distress  Head: Normocephalic, atraumatic. Moist oral mucosal membranes  Eyes: Anicteric  Neck: Supple, trachea midline  Lungs:  Basilar rales, normal effort  Heart: S1S2 no rubs  Abdomen:  Soft, nontender, bowel sounds present  Extremities: 1+ peripheral edema.  Neurologic: Awake, alert, following commands  Skin: No lesions  Access: IJ PermCath in place    Basic Metabolic Panel: Recent Labs  Lab 03/09/19 1951 03/10/19 0524 03/10/19 1542 03/11/19 0805 03/11/19 1138 03/12/19 0406 03/13/19 0351  NA 131* 137  --  140  --  137 137  K 3.9 4.6  --  4.5  --  3.8 4.5  CL 106 111  --  105  --  100 102  CO2 17* 19*  --  26  --  26 25  GLUCOSE 92 99  --  94  --  91 90  BUN 72* 73*  --  47*  --  25* 44*  CREATININE 4.82* 5.03*  --  4.30*  --  2.91* 4.27*  CALCIUM 7.9* 8.1*  --  8.4*  --  8.5* 8.7*  MG  --   --   --  2.3  --  1.9 2.1  PHOS  --   --  7.1* 5.6* 5.0* 3.5  --     Liver Function Tests: Recent Labs  Lab 03/09/19 1951  AST 21  ALT 14  ALKPHOS 64  BILITOT 0.4  PROT 6.3*  ALBUMIN 3.2*   No results for input(s): LIPASE, AMYLASE in the last 168 hours. No results for input(s): AMMONIA in the last 168  hours.  CBC: Recent Labs  Lab 03/09/19 1951 03/11/19 0805 03/12/19 0406  WBC 6.0 5.6 7.6  NEUTROABS 3.7  --   --   HGB 9.7* 10.3* 10.4*  HCT 30.6* 32.6* 33.1*  MCV 95.3 95.6 94.3  PLT 138* 156 161    Cardiac Enzymes: No results for input(s): CKTOTAL, CKMB, CKMBINDEX, TROPONINI in the last 168 hours.  BNP: Invalid input(s): POCBNP  CBG: No results for input(s): GLUCAP in the last 168 hours.  Microbiology: Results for orders placed or performed during the hospital encounter of 03/09/19  Novel Coronavirus,NAA,(SEND-OUT TO REF LAB - TAT 24-48 hrs); Hosp Order     Status: None   Collection Time: 03/09/19  7:50 PM   Specimen: Nasopharyngeal Swab; Respiratory  Result Value Ref Range Status   SARS-CoV-2, NAA NOT DETECTED NOT DETECTED Final    Comment: (NOTE) This test was developed and its performance characteristics determined by [employer]. This test has not been FDA cleared or approved. This test has been authorized by FDA under an Emergency Use Authorization (EUA). This test is only authorized for the duration  of time the declaration that circumstances exist justifying the authorization of the emergency use of in vitro diagnostic tests for detection of SARS-CoV-2 virus and/or diagnosis of COVID-19 infection under section 564(b)(1) of the Act, 21 U.S.C. 638LHT-3(S)(2), unless the authorization is terminated or revoked sooner. When diagnostic testing is negative, the possibility of a false negative result should be considered in the context of a patient's recent exposures and the presence of clinical signs and symptoms consistent with COVID-19. An individual without symptoms of COVID-19 and who is not shedding SARS-CoV-2 virus would expect to have a negative (not detected) result in this assay. Performed  At: [facility] [address] [place], [place] [id number] Rush Farmer MD IO:[id number]    [place]  Final    Comment:  Performed at [facility], [address] [place], [place] [address]   *Note: Due to a large number of results and/or encounters for the requested time period, some results have not been displayed. A complete set of results can be found in Results Review.    Coagulation Studies: No results for input(s): LABPROT, INR in the last 72 hours.  Urinalysis: Recent Labs    03/12/19 1847  COLORURINE YELLOW*  LABSPEC 1.008  PHURINE 8.0  GLUCOSEU NEGATIVE  HGBUR SMALL*  BILIRUBINUR NEGATIVE  KETONESUR NEGATIVE  PROTEINUR 100*  NITRITE NEGATIVE  LEUKOCYTESUR LARGE*      Imaging: No results found.   Medications:   . sodium chloride    . cefTRIAXone (ROCEPHIN)  IV     . albuterol  2 puff Inhalation Q6H  . aspirin EC  81 mg Oral Daily  . atorvastatin  40 mg Oral q1800  . Chlorhexidine Gluconate Cloth  6 each Topical Q0600  . diclofenac sodium  2 g Topical QID  . [START ON 03/14/2019] epoetin (EPOGEN/PROCRIT) injection  4,000 Units Intravenous Q T,Th,Sa-HD  . heparin  5,000 Units Subcutaneous Q8H  . hydrALAZINE  50 mg Oral TID  . isosorbide dinitrate  10 mg Oral BID  . metoprolol succinate  50 mg Oral Daily  . pantoprazole  40 mg Oral Daily  . sertraline  100 mg Oral Daily  . sodium chloride flush  3 mL Intravenous Q12H  . venlafaxine  37.5 mg Oral BID  . vitamin B-12  500 mcg Oral Daily   sodium chloride, acetaminophen, albuterol, ALPRAZolam, HYDROcodone-acetaminophen, loratadine, nitroGLYCERIN, ondansetron **OR** ondansetron (ZOFRAN) IV, polyethylene glycol, sodium chloride flush  Assessment/ Plan:  53 y.o. 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  1.  ESRD on HD.  Dialysis for later  today Outpatient planning for [facility] dialysis. MWF  2.  Hypertension.  Continue hydralazine, isosorbide dinitrate, metoprolol.  3.  Anemia chronic kidney disease.  Continue Epogen with dialysis.  4.  Secondary hyperparathyroidism.   Low phos diet.   LOS: 4 Kaian Fahs 6/15/202012:41 PM

## 2019-03-13 NOTE — Progress Notes (Signed)
Post HD Assessment    03/13/19 1900  Neurological  Level of Consciousness Alert  Orientation Level Oriented X4  Respiratory  Respiratory Pattern Regular  Chest Assessment Chest expansion symmetrical  Bilateral Breath Sounds Clear;Diminished  Cough None  Cardiac  Pulse Regular  Heart Sounds S1, S2  ECG Monitor Yes  Cardiac Rhythm NSR;BBB  Vascular  R Radial Pulse +2  L Radial Pulse +2  Edema Generalized  Generalized Edema +1  Psychosocial  Psychosocial (WDL) WDL

## 2019-03-13 NOTE — Progress Notes (Signed)
Post HD [facility]    03/13/19 1852  Vital Signs  Pulse Rate 81  Resp (!) 26  BP (!) 134/95  Oxygen Therapy  SpO2 98 %  Post-Hemodialysis Assessment  Rinseback Volume (mL) 250 mL  KECN 63.8 V  Dialyzer Clearance Lightly streaked  Duration of HD Treatment -hour(s) 3 hour(s)  Hemodialysis Intake (mL) 500 mL  UF Total -Machine (mL) 3000 mL  Net UF (mL) 2500 mL  Tolerated HD Treatment Yes  Fistula / Graft Right Upper arm Arteriovenous vein graft  Placement Date/Time: 01/30/19 0834   Placed prior to admission: No  Orientation: Right  Access Location: Upper arm  Access Type: Arteriovenous vein graft  Expiration Date: 12/13/22  Site Condition No complications  Fistula / Graft Assessment Present;Thrill;Bruit  Status Deaccessed  Drainage Description None  Hemodialysis Catheter Right Internal jugular Double-lumen;Permanent  Placement Date/Time: 01/10/19 1349   Time Out: Correct procedure;Correct patient;Correct site  Maximum sterile barrier precautions: Hand hygiene;Cap;Mask;Sterile gown;Large sterile sheet;Sterile gloves  Site Prep: Chlorhexidine;Skin Prep Completely Dr...  Site Condition No complications  Post treatment catheter status Capped and Clamped

## 2019-03-13 NOTE — Progress Notes (Signed)
Pre HD Assessment    03/13/19 1540  Neurological  Level of Consciousness Alert  Orientation Level Oriented X4  Respiratory  Respiratory Pattern Regular  Chest Assessment Chest expansion symmetrical  Bilateral Breath Sounds Diminished;Fine crackles  Cough None  Cardiac  Pulse Regular  Heart Sounds S1, S2  ECG Monitor Yes  Cardiac Rhythm NSR;BBB  Vascular  R Radial Pulse +2  L Radial Pulse +2  Edema Generalized  Generalized Edema +2  Psychosocial  Psychosocial (WDL) WDL

## 2019-03-13 NOTE — Progress Notes (Signed)
Established hemodialysis patient known at [facility], requested to transfer to [facility]. New chair time at [facility] MWF 10:45. Can start Wednesday 6/ 17 to arrive by 10:00.

## 2019-03-13 NOTE — Progress Notes (Signed)
Pre HD Tx   03/13/19 1541  Hand-Off documentation  Report given to (Full Name) Beatris Ship, RN   Report received from (Full Name) [place]ble [place], RN   Vital Signs  Temp 98.4 F (36.9 C)  Temp Source Oral  Pulse Rate 86  Pulse Rate Source Monitor  Resp 18  BP (!) 141/99  BP Location Left Arm  BP Method Automatic  Patient Position (if appropriate) Lying  Oxygen Therapy  SpO2 98 %  O2 Device Room Air  Pain Assessment  Pain Scale 0-10  Pain Score 0  Dialysis Weight  Weight 79.3 kg  Type of Weight Pre-Dialysis  Time-Out for Hemodialysis  What Procedure? HD  Pt Identifiers(min of two) First/Last Name;MRN/Account#  Correct Site? Yes  Correct Side? Yes  Correct Procedure? Yes  Consents Verified? Yes  Rad Studies Available? N/A  Safety Precautions Reviewed? Yes  Engineer, civil (consulting) Number 1  Station Number 2  UF/Alarm Test Passed  Conductivity: Meter 14  Conductivity: Machine  14  pH 7.2  Reverse Osmosis Main  Normal Saline Lot Number [id number]  Dialyzer Lot Number [id number]  Disposable Set Lot Number [id number]  Machine Temperature 98.6 F (37 C)  Musician and Audible Yes  Blood Lines Intact and Secured Yes  Pre Treatment Patient Checks  Vascular access used during treatment Graft  HD catheter dressing before treatment WDL  Hepatitis B Surface Antigen Results Negative  Date Hepatitis B Surface Antigen Drawn 03/10/19  Hepatitis B Surface Antibody  (<10)  Date Hepatitis B Surface Antibody Drawn 01/10/19  Hemodialysis Consent Verified Yes  Hemodialysis Standing Orders Initiated Yes  ECG (Telemetry) Monitor On Yes  Prime Ordered Normal Saline  Length of  DialysisTreatment -hour(s) 3 Hour(s)  Dialysis Treatment Comments Na 140  Dialyzer Elisio 17H NR  Dialysate 2K;2.5 Ca  Dialysis Anticoagulant None  Dialysate Flow Ordered 600  Blood Flow Rate Ordered 350 mL/min (verbal order change by MD )  Ultrafiltration Goal 2 Liters  Dialysis Blood Pressure Support  Ordered Normal Saline  Education / Care Plan  Dialysis Education Provided Yes  Documented Education in Care Plan Yes  Fistula / Graft Right Upper arm Arteriovenous vein graft  Placement Date/Time: 01/30/19 0834   Placed prior to admission: No  Orientation: Right  Access Location: Upper arm  Access Type: Arteriovenous vein graft  Expiration Date: 12/13/22  Site Condition No complications  Fistula / Graft Assessment Present;Thrill;Bruit  Status Patent  Drainage Description None  Hemodialysis Catheter Right Internal jugular Double-lumen;Permanent  Placement Date/Time: 01/10/19 1349   Time Out: Correct procedure;Correct patient;Correct site  Maximum sterile barrier precautions: Hand hygiene;Cap;Mask;Sterile gown;Large sterile sheet;Sterile gloves  Site Prep: Chlorhexidine;Skin Prep Completely Dr...  Site Condition No complications  Post treatment catheter status Capped and Clamped

## 2019-03-13 NOTE — Progress Notes (Signed)
[facility] [place] NAME: Summer Cook    MR#:  [id number]  DATE OF BIRTH:  02-22-1966  SUBJECTIVE:  CHIEF COMPLAINT:  No chief complaint on file.  No shortness of breath.  Patient having dysuria and frequent urination.  Found to have urinary tract infection.  Started on IV Rocephin today.  Patient previously tolerated Rocephin in the past.  Later developed allergic reaction to Rocephin with significant rash and itching.  Rocephin discontinued today. Scheduled for dialysis today   REVIEW OF SYSTEMS:  ROS ROS CONSTITUTIONAL: No fever, fatigue or weakness.  EYES: No blurred or double vision.  EARS, NOSE, AND THROAT: No tinnitus or ear pain.  RESPIRATORY:  No shortness of breath, pedal edema  CARDIOVASCULAR: No chest pain, orthopnea, edema.  GASTROINTESTINAL: No nausea, vomiting, diarrhea or abdominal pain.  GENITOURINARY: Positive for dysuria, no hematuria.  ENDOCRINE: No polyuria, nocturia,  HEMATOLOGY: No anemia, easy bruising or bleeding SKIN: No rash or lesion. MUSCULOSKELETAL: No joint pain or arthritis.   NEUROLOGIC: No tingling, numbness, weakness.  PSYCHIATRY: No anxiety or depression.   DRUG ALLERGIES:   Allergies  Allergen Reactions  . Ciprofloxacin Nausea And Vomiting  . Ceftriaxone Rash  . Dilaudid [Hydromorphone Hcl] Anxiety and Other (See Comments)    Causes hallucinations, anxiety, and panic per patient  . Yellow Jacket Venom Swelling    Swelling at injection site  . Adhesive [Tape] Rash    Please use paper tape  . Brintellix [Vortioxetine] Itching  . Cymbalta [Duloxetine Hcl] Itching  . Penicillins Itching    Did it involve swelling of the face/tongue/throat, SOB, or low BP? No Did it involve sudden or severe rash/hives, skin peeling, or any reaction on the inside of your mouth or nose? No Did you need to seek medical attention at a hospital or doctor's office? No When did it last happen?53 yrs  old If all above answers are "NO", may proceed with cephalosporin use.   VITALS:  Blood pressure 123/80, pulse 79, temperature 99 F (37.2 C), temperature source Oral, resp. rate 18, height 5\' 5"  (1.651 m), weight 79.3 kg, last menstrual period 08/13/2011, SpO2 98 %. PHYSICAL EXAMINATION:  Physical Exam   GENERAL:  53 y.o.-year-old patient lying in the bed with no acute distress.  EYES: Pupils equal, round, reactive to light and accommodation. No scleral icterus. Extraocular muscles intact.  HEENT: Head atraumatic, normocephalic. Oropharynx and nasopharynx clear.  NECK:  Supple, no jugular venous distention. No thyroid enlargement, no tenderness.  LUNGS: Managed breath sounds bilaterally. CARDIOVASCULAR: S1, S2 normal. No murmurs, rubs, or gallops.  ABDOMEN: Soft, nontender, nondistended. Bowel sounds present. No organomegaly or mass.  EXTREMITIES: Bilateral leg edema  NEUROLOGIC: Cranial nerves II through XII are intact. Muscle strength 5/5 in all extremities. Sensation intact. Gait not checked.  PSYCHIATRIC: The patient is alert and oriented x 3.  SKIN: Rash with some wheals after initiation of Rocephin.  No ulcers.     LABORATORY PANEL:  Female CBC Recent Labs  Lab 03/13/19 1427  WBC 5.2  HGB 9.9*  HCT 31.4*  PLT 174   ------------------------------------------------------------------------------------------------------------------ Chemistries  Recent Labs  Lab 03/09/19 1951  03/13/19 0351  NA 131*   < > 137  K 3.9   < > 4.5  CL 106   < > 102  CO2 17*   < > 25  GLUCOSE 92   < > 90  BUN 72*   < > 44*  CREATININE 4.82*   < >  4.27*  CALCIUM 7.9*   < > 8.7*  MG  --    < > 2.1  AST 21  --   --   ALT 14  --   --   ALKPHOS 64  --   --   BILITOT 0.4  --   --    < > = values in this interval not displayed.   RADIOLOGY:  No results found. ASSESSMENT AND PLAN:     53 year old female patient with history of ESRD on dialysis Tuesday, Thursday, Saturday missed  hemodialysis sessions as she felt she did not need it, last dialysis was on June 4 comes in because of worsening shortness of breath, pedal edema along with cough..  1. Acute shortness of breath secondary to missed hemodialysis sessions:  Nephrology service following.  Patient had hemodialysis to hemodialysis session during this admission.  Scheduled for hemodialysis today.   Patient now has a new outpatient hemodialysis chair to be initiated on Wednesday  2.  Hypertension:  Blood pressure better controlled.    3.  Urinary tract infection Patient having symptoms with dysuria and increase frequency of urination.  Developed allergic reaction to Rocephin with rash and wheals.  Patient also reported having previous reaction to ciprofloxacin.  These have been added to list of allergies.  Patient started on IV Azactam pending results of sensitivities.   4.  Allergic reaction to Rocephin Discontinued and added to list of allergies.  Started on Solu-Medrol IV.  PRN Benadryl as well as Pepcid.  Monitor clinically.  No evidence of any airway compromise.   DVT prophylaxis ; heparin  All the records are reviewed and case discussed with Care Management/Social Worker. Management plans discussed with the patient, family and they are in agreement.  CODE STATUS: Full Code  TOTAL TIME TAKING CARE OF THIS PATIENT: 35 minutes.   More than 50% of the time was spent in counseling/coordination of care: YES  POSSIBLE D/C IN 1-2 DAYS, DEPENDING ON CLINICAL CONDITION.   Summer Cook M.D on 03/13/2019 at 3:00 PM  Between 7am to 6pm - Pager - [phone]  After 6pm go to [email or web address] - Proofreader  [facility]  Office  [phone]  CC: Primary care physician; Redmond School, MD  Note: This dictation was prepared with Dragon dictation along with smaller phrase technology. Any transcriptional errors that result from this process are unintentional.

## 2019-03-13 NOTE — Progress Notes (Signed)
HD Tx completed, tolerated well. UF goal met    03/13/19 1845  Vital Signs  Pulse Rate 81  Resp 17  BP (!) 138/103  Oxygen Therapy  SpO2 98 %  During Hemodialysis Assessment  HD Safety Checks Performed Yes  KECN 56.3 KECN  Dialysis Fluid Bolus Normal Saline  Bolus Amount (mL) 250 mL  Intra-Hemodialysis Comments Tx completed;Tolerated well

## 2019-03-13 NOTE — Progress Notes (Signed)
HD Tx started w/o complication    92/44/62 1545  Vital Signs  Pulse Rate 82  Resp (!) 26  BP (!) 140/99  Oxygen Therapy  SpO2 98 %  During Hemodialysis Assessment  Blood Flow Rate (mL/min) 350 mL/min  Arterial Pressure (mmHg) -150 mmHg  Venous Pressure (mmHg) 170 mmHg  Transmembrane Pressure (mmHg) 60 mmHg  Ultrafiltration Rate (mL/min) 1090 mL/min  Dialysate Flow Rate (mL/min) 600 ml/min  Conductivity: Machine  14  HD Safety Checks Performed Yes  Dialysis Fluid Bolus Normal Saline  Bolus Amount (mL) 250 mL  Intra-Hemodialysis Comments Tx initiated  Fistula / Graft Right Upper arm Arteriovenous vein graft  Placement Date/Time: 01/30/19 0834   Placed prior to admission: No  Orientation: Right  Access Location: Upper arm  Access Type: Arteriovenous vein graft  Expiration Date: 12/13/22  Status Accessed  Needle Size 16 g  Drainage Description None

## 2019-03-14 LAB — BASIC METABOLIC PANEL
Anion gap: 13 (ref 5–15)
BUN: 31 mg/dL — ABNORMAL HIGH (ref 6–20)
CO2: 25 mmol/L (ref 22–32)
Calcium: 8.5 mg/dL — ABNORMAL LOW (ref 8.9–10.3)
Chloride: 102 mmol/L (ref 98–111)
Creatinine, Ser: 3.49 mg/dL — ABNORMAL HIGH (ref 0.44–1.00)
GFR calc Af Amer: 16 mL/min — ABNORMAL LOW (ref >60)
GFR calc non Af Amer: 14 mL/min — ABNORMAL LOW (ref >60)
Glucose, Bld: 83 mg/dL (ref 70–99)
Potassium: 3.5 mmol/L (ref 3.5–5.1)
Sodium: 140 mmol/L (ref 135–145)

## 2019-03-14 LAB — MAGNESIUM: Magnesium: 2 mg/dL (ref 1.7–2.4)

## 2019-03-14 MED ORDER — FOSFOMYCIN TROMETHAMINE 3 G PO PACK
3.0000 g | PACK | Freq: Once | ORAL | Status: AC
  Administered 2019-03-14: 3 g via ORAL
  Filled 2019-03-14: qty 3

## 2019-03-14 MED ORDER — DIPHENHYDRAMINE HCL 25 MG PO CAPS: 25.0000 mg | ORAL_CAPSULE | Freq: Three times a day (TID) | ORAL | 0 refills | Status: DC | PRN

## 2019-03-14 MED ORDER — PREDNISONE 20 MG PO TABS: 40.0000 mg | ORAL_TABLET | Freq: Every day | ORAL | 0 refills | Status: AC

## 2019-03-14 MED ORDER — RENA-VITE PO TABS: 1.0000 | ORAL_TABLET | Freq: Every day | ORAL | Status: DC

## 2019-03-14 MED ORDER — FAMOTIDINE 20 MG PO TABS: 20.0000 mg | ORAL_TABLET | Freq: Every day | ORAL | 0 refills | Status: DC

## 2019-03-14 MED ORDER — NEPRO/CARBSTEADY PO LIQD
237.0000 mL | Freq: Two times a day (BID) | ORAL | Status: DC
  Administered 2019-03-14: 237 mL via ORAL

## 2019-03-14 NOTE — Progress Notes (Signed)
Pt Discharged per MD order. IV removed. Discharge instructions reviewed with pt. Pt verbalized understanding. All questions answered to pt satisfaction. Pt taken downstairs in wheelchair by staff.

## 2019-03-14 NOTE — Discharge Summary (Signed)
[facility] NAME: Summer Cook    MR#:  [id number]  DATE OF BIRTH:  Dec 26, 1965  DATE OF ADMISSION:  03/09/2019   ADMITTING PHYSICIAN: Gorden Harms, MD  DATE OF DISCHARGE: 03/14/2019  PRIMARY CARE PHYSICIAN: Redmond School, MD   ADMISSION DIAGNOSIS:  ESRD  DISCHARGE DIAGNOSIS:  Active Problems:   ESRD (end stage renal disease) ([place])  SECONDARY DIAGNOSIS:   Past Medical History:  Diagnosis Date  . Allergy   . Anxiety   . Asthma   . CAD (coronary artery disease), native coronary artery    a. cath 05/20/17 - 100% distal PDA --> medical mangement (no intervention done)  . Candida esophagitis ([place])   . Chronic combined systolic and diastolic CHF (congestive heart failure) ([place])   . Depression   . End stage renal disease on dialysis [facility])    T/ Th/ Sat starting 4/28- [facility]  . Essential hypertension, benign   . GERD (gastroesophageal reflux disease)   . Gout   . Headache   . Hemorrhoids   . 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  Last was Prednisone 40mg ; 2 wk in 10/2012.  She also was on Prednisone bridged with Cellcept briefly but stopped due to lack of insurance.   . Myocardial infarction ([place])   . Perinephric hematoma   . Pneumonia   . Pulmonary embolism ([place]) 04/2012  . Serrated adenoma of colon   . Steroid-induced diabetes ([place])   . Stroke ([place])   . Thrush, oral 05/29/2011   HOSPITAL COURSE:  Chief complaint; shortness of breath  History of presenting complaint; HISTORY OF PRESENT ILLNESS: Summer Cook  is a 53 y.o. female with a known history of end-stage renal disease on hemodialysis Tuesday/Thursday/Saturdays, had not received hemodialysis since March 02, 2019-patient states that she did not think she is needed dialysis any further, patient was asked accepted as a direct admission from  Dr. Kolluru/nephrology with plans for hemodialysis on tomorrow given worsening shortness of breath/nonproductive cough, on the floor patient is in no apparent distress, resting comfortably in bed, patient states that she is still making urine, patient was admitted for chronic end-stage renal disease with plans for hemodialysis.   Hospital course; 1. Acute shortness of breath secondary to missed hemodialysis sessions:  Nephrology service following.  Patient had hemodialysis to hemodialysis sessions during this admission.  Patient now has a new outpatient hemodialysis chair to be initiated on Wednesday  2. Hypertension:  Blood pressure better controlled.    3.  Urinary tract infection Patient having symptoms with dysuria and increase frequency of urination.  Developed allergic reaction to Rocephin with rash and wheals.  Patient also reported having previous reaction to ciprofloxacin.  These have been added to list of allergies.  Patient started on IV Azactam yesterday.  Discussed treatment options with patient and pharmacist today.  Patient asking for discharge home today.  Was given a dose of fosfomycin to complete treatment prior to discharge from the hospital   4.  Allergic reaction to Rocephin Discontinued and added to list of allergies.  Treated with  Solu-Medrol IV.  PRN Benadryl as well as Pepcid.  Monitor clinically.  No evidence of any airway compromise..  Patient clinically stable for discharge.  No evidence of any rash today.  Discharge on prednisone for a few more days.  Prescription for as needed Benadryl and Pepcid was also  given.  DISCHARGE CONDITIONS:  Stable CONSULTS OBTAINED:  Treatment Team:  Anthonette Legato, MD DRUG ALLERGIES:   Allergies  Allergen Reactions  . Ciprofloxacin Nausea And Vomiting  . Ceftriaxone Rash  . Dilaudid [Hydromorphone Hcl] Anxiety and Other (See Comments)    Causes hallucinations, anxiety, and panic per patient  . Yellow Jacket Venom  Swelling    Swelling at injection site  . Adhesive [Tape] Rash    Please use paper tape  . Brintellix [Vortioxetine] Itching  . Cymbalta [Duloxetine Hcl] Itching  . Penicillins Itching    Did it involve swelling of the face/tongue/throat, SOB, or low BP? No Did it involve sudden or severe rash/hives, skin peeling, or any reaction on the inside of your mouth or nose? No Did you need to seek medical attention at a hospital or doctor's office? No When did it last happen?53 yrs old If all above answers are "NO", may proceed with cephalosporin use.   DISCHARGE MEDICATIONS:   Allergies as of 03/14/2019      Reactions   Ciprofloxacin Nausea And Vomiting   Ceftriaxone Rash   Dilaudid [hydromorphone Hcl] Anxiety, Other (See Comments)   Causes hallucinations, anxiety, and panic per patient   Yellow Jacket Venom Swelling   Swelling at injection site   Adhesive [tape] Rash   Please use paper tape   Brintellix [vortioxetine] Itching   Cymbalta [duloxetine Hcl] Itching   Penicillins Itching   Did it involve swelling of the face/tongue/throat, SOB, or low BP? No Did it involve sudden or severe rash/hives, skin peeling, or any reaction on the inside of your mouth or nose? No Did you need to seek medical attention at a hospital or doctor's office? No When did it last happen?53 yrs old If all above answers are "NO", may proceed with cephalosporin use.      Medication List    STOP taking these medications   Oxycodone HCl 10 MG Tabs     TAKE these medications   acetaminophen 500 MG tablet Commonly known as: TYLENOL Take 1,000 mg by mouth every 6 (six) hours as needed for moderate pain.   albuterol (2.5 MG/3ML) 0.083% nebulizer solution Commonly known as: PROVENTIL Take 3 mLs (2.5 mg total) by nebulization every 4 (four) hours as needed for shortness of breath.   ALPRAZolam 0.25 MG tablet Commonly known as: XANAX Take 1 tablet (0.25 mg total) by mouth at bedtime as needed  for anxiety.   aspirin 81 MG EC tablet Take 1 tablet (81 mg total) by mouth daily.   atorvastatin 40 MG tablet Commonly known as: LIPITOR Take 1 tablet (40 mg total) by mouth daily at 6 PM.   B-12 PO Take 1 tablet by mouth daily.   diclofenac sodium 1 % Gel Commonly known as: VOLTAREN Apply 2 g topically 4 (four) times daily.   diphenhydrAMINE 25 mg capsule Commonly known as: Benadryl Allergy Take 1 capsule (25 mg total) by mouth every 8 (eight) hours as needed for up to 4 days for itching or allergies.   famotidine 20 MG tablet Commonly known as: Pepcid Take 1 tablet (20 mg total) by mouth daily for 4 days.   hydrALAZINE 50 MG tablet Commonly known as: APRESOLINE Take 1 tablet (50 mg total) by mouth 3 (three) times daily.   HYDROcodone-acetaminophen 5-325 MG tablet Commonly known as: Norco Take 1 tablet by mouth every 6 (six) hours as needed for moderate pain.   isosorbide dinitrate 10 MG tablet Commonly known as: ISORDIL Take 1  tablet (10 mg total) by mouth 2 (two) times daily.   lidocaine-prilocaine cream Commonly known as: EMLA Apply 1 application topically as needed.   loratadine 10 MG tablet Commonly known as: CLARITIN Take 10 mg by mouth daily as needed for allergies.   metoprolol succinate 50 MG 24 hr tablet Commonly known as: TOPROL-XL Take 50 mg by mouth daily. Take with or immediately following a meal.   nitroGLYCERIN 0.4 MG SL tablet Commonly known as: NITROSTAT Place 1 tablet (0.4 mg total) under the tongue every 5 (five) minutes x 3 doses as needed for chest pain.   predniSONE 20 MG tablet Commonly known as: Deltasone Take 2 tablets (40 mg total) by mouth daily for 4 days.   sertraline 100 MG tablet Commonly known as: ZOLOFT Take 100 mg by mouth daily.   venlafaxine 37.5 MG tablet Commonly known as: EFFEXOR Take 37.5 mg by mouth 2 (two) times daily.        DISCHARGE INSTRUCTIONS:   DIET:  Renal diet DISCHARGE CONDITION:   Stable ACTIVITY:  Activity as tolerated OXYGEN:  Home Oxygen: No.  Oxygen Delivery: room air DISCHARGE LOCATION:  home   If you experience worsening of your admission symptoms, develop shortness of breath, life threatening emergency, suicidal or homicidal thoughts you must seek medical attention immediately by calling 911 or calling your MD immediately  if symptoms less severe.  You Must read complete instructions/literature along with all the possible adverse reactions/side effects for all the Medicines you take and that have been prescribed to you. Take any new Medicines after you have completely understood and accpet all the possible adverse reactions/side effects.   Please note  You were cared for by a hospitalist during your hospital stay. If you have any questions about your discharge medications or the care you received while you were in the hospital after you are discharged, you can call the unit and asked to speak with the hospitalist on call if the hospitalist that took care of you is not available. Once you are discharged, your primary care physician will handle any further medical issues. Please note that NO REFILLS for any discharge medications will be authorized once you are discharged, as it is imperative that you return to your primary care physician (or establish a relationship with a primary care physician if you do not have one) for your aftercare needs so that they can reassess your need for medications and monitor your lab values.    On the day of Discharge:  VITAL SIGNS:  Blood pressure 107/74, pulse 73, temperature 98.1 F (36.7 C), temperature source Oral, resp. rate 20, height 5\' 5"  (1.651 m), weight 79.7 kg, last menstrual period 08/13/2011, SpO2 96 %. PHYSICAL EXAMINATION:  GENERAL:  53 y.o.-year-old patient lying in the bed with no acute distress.  EYES: Pupils equal, round, reactive to light and accommodation. No scleral icterus. Extraocular muscles intact.   HEENT: Head atraumatic, normocephalic. Oropharynx and nasopharynx clear.  NECK:  Supple, no jugular venous distention. No thyroid enlargement, no tenderness.  LUNGS: Normal breath sounds bilaterally, no wheezing, rales,rhonchi or crepitation. No use of accessory muscles of respiration.  CARDIOVASCULAR: S1, S2 normal. No murmurs, rubs, or gallops.  ABDOMEN: Soft, non-tender, non-distended. Bowel sounds present. No organomegaly or mass.  EXTREMITIES: No pedal edema, cyanosis, or clubbing.  NEUROLOGIC: Cranial nerves II through XII are intact. Muscle strength 5/5 in all extremities. Sensation intact. Gait not checked.  PSYCHIATRIC: The patient is alert and oriented x 3.  SKIN:  No obvious rash, lesion, or ulcer.  DATA REVIEW:   CBC Recent Labs  Lab 03/13/19 1427  WBC 5.2  HGB 9.9*  HCT 31.4*  PLT 174    Chemistries  Recent Labs  Lab 03/09/19 1951  03/14/19 0451  NA 131*   < > 140  K 3.9   < > 3.5  CL 106   < > 102  CO2 17*   < > 25  GLUCOSE 92   < > 83  BUN 72*   < > 31*  CREATININE 4.82*   < > 3.49*  CALCIUM 7.9*   < > 8.5*  MG  --    < > 2.0  AST 21  --   --   ALT 14  --   --   ALKPHOS 64  --   --   BILITOT 0.4  --   --    < > = values in this interval not displayed.     Microbiology Results  Results for orders placed or performed during the hospital encounter of 03/09/19  Novel Coronavirus,NAA,(SEND-OUT TO REF LAB - TAT 24-48 hrs); Hosp Order     Status: None   Collection Time: 03/09/19  7:50 PM   Specimen: Nasopharyngeal Swab; Respiratory  Result Value Ref Range Status   SARS-CoV-2, NAA NOT DETECTED NOT DETECTED Final    Comment: (NOTE) This test was developed and its performance characteristics determined by [employer]. This test has not been FDA cleared or approved. This test has been authorized by FDA under an Emergency Use Authorization (EUA). This test is only authorized for the duration of time the declaration that circumstances  exist justifying the authorization of the emergency use of in vitro diagnostic tests for detection of SARS-CoV-2 virus and/or diagnosis of COVID-19 infection under section 564(b)(1) of the Act, 21 U.S.C. 660YTK-1(S)(0), unless the authorization is terminated or revoked sooner. When diagnostic testing is negative, the possibility of a false negative result should be considered in the context of a patient's recent exposures and the presence of clinical signs and symptoms consistent with COVID-19. An individual without symptoms of COVID-19 and who is not shedding SARS-CoV-2 virus would expect to have a negative (not detected) result in this assay. Performed  At: [facility] [address] [place], [place] [id number] Rush Farmer MD DU:[id number]    [place]  Final    Comment: Performed at [facility], [address] [place], [place] [address]   *Note: Due to a large number of results and/or encounters for the requested time period, some results have not been displayed. A complete set of results can be found in Results Review.    RADIOLOGY:  No results found.   Management plans discussed with the patient, family and they are in agreement.  CODE STATUS: Full Code   TOTAL TIME TAKING CARE OF THIS PATIENT: 39 minutes.    Corri Delapaz M.D on 03/14/2019 at 10:36 AM  Between 7am to 6pm - Pager - [phone]  After 6pm go to [email or web address] - Proofreader  [facility]  Office  [phone]  CC: Primary care physician; Redmond School, MD   Note: This dictation was prepared with Dragon dictation along with smaller phrase technology. Any transcriptional errors that result from this process are unintentional.

## 2019-03-15 ENCOUNTER — Other Ambulatory Visit: Payer: Self-pay

## 2019-03-15 DIAGNOSIS — E119 Type 2 diabetes mellitus without complications: Secondary | ICD-10-CM | POA: Diagnosis not present

## 2019-03-15 DIAGNOSIS — Z79899 Other long term (current) drug therapy: Secondary | ICD-10-CM | POA: Diagnosis not present

## 2019-03-15 DIAGNOSIS — N186 End stage renal disease: Secondary | ICD-10-CM

## 2019-03-15 DIAGNOSIS — Z992 Dependence on renal dialysis: Secondary | ICD-10-CM | POA: Diagnosis not present

## 2019-03-17 DIAGNOSIS — N186 End stage renal disease: Secondary | ICD-10-CM | POA: Diagnosis not present

## 2019-03-17 DIAGNOSIS — Z992 Dependence on renal dialysis: Secondary | ICD-10-CM | POA: Diagnosis not present

## 2019-03-20 ENCOUNTER — Other Ambulatory Visit: Payer: Self-pay

## 2019-03-20 DIAGNOSIS — Z992 Dependence on renal dialysis: Secondary | ICD-10-CM | POA: Diagnosis not present

## 2019-03-20 DIAGNOSIS — N186 End stage renal disease: Secondary | ICD-10-CM | POA: Diagnosis not present

## 2019-03-20 NOTE — Patient Outreach (Signed)
[facility]) Care Management  03/20/2019  Summer Cook 1965-10-08 [id number]   Referral received from [facility] for complex case management. Attempted initial outreach. [name] was not home at the time of the call. Spoke with her mother, Kendrick Fries. HIPAA identifiers verified. Per Kendrick Fries, she receives dialysis on Monday, Wednesday and Friday. Agreed to relay message regarding the call.  PLAN -Will attempt outreach later this week.  [place] [phone]

## 2019-03-22 DIAGNOSIS — Z992 Dependence on renal dialysis: Secondary | ICD-10-CM | POA: Diagnosis not present

## 2019-03-22 DIAGNOSIS — N186 End stage renal disease: Secondary | ICD-10-CM | POA: Diagnosis not present

## 2019-03-23 ENCOUNTER — Other Ambulatory Visit: Payer: Self-pay

## 2019-03-23 ENCOUNTER — Telehealth (INDEPENDENT_AMBULATORY_CARE_PROVIDER_SITE_OTHER): Payer: Medicare Other | Admitting: Student

## 2019-03-23 ENCOUNTER — Encounter: Payer: Self-pay | Admitting: Student

## 2019-03-23 VITALS — BP 122/84 | HR 84

## 2019-03-23 DIAGNOSIS — E785 Hyperlipidemia, unspecified: Secondary | ICD-10-CM

## 2019-03-23 DIAGNOSIS — N186 End stage renal disease: Secondary | ICD-10-CM

## 2019-03-23 DIAGNOSIS — I428 Other cardiomyopathies: Secondary | ICD-10-CM

## 2019-03-23 DIAGNOSIS — I5042 Chronic combined systolic (congestive) and diastolic (congestive) heart failure: Secondary | ICD-10-CM

## 2019-03-23 DIAGNOSIS — I10 Essential (primary) hypertension: Secondary | ICD-10-CM

## 2019-03-23 DIAGNOSIS — I2510 Atherosclerotic heart disease of native coronary artery without angina pectoris: Secondary | ICD-10-CM

## 2019-03-23 NOTE — Patient Outreach (Signed)
[facility]) Care Management  03/23/2019  Summer Cook 1965-10-10 [id number]   Successful outreach with Summer Cook. She reports feeling well today. She was just returning home at the time of my call. Prefers to be contacted on either Tuesday or Thursday of next week around 4pm. Agreeable to completing a telephonic assessment at that time.  She denies urgent concerns or immediate care management needs. RNCM contact information provided. Encouraged to call if needed prior to scheduled outreach.  PLAN -Will follow-up on 03/30/19.  [place] [phone]

## 2019-03-23 NOTE — Progress Notes (Signed)
Virtual Visit via Telephone Note   This visit type was conducted due to national recommendations for restrictions regarding the COVID-19 Pandemic (e.g. social distancing) in an effort to limit this patient's exposure and mitigate transmission in our community.  Due to her co-morbid illnesses, this patient is at least at moderate risk for complications without adequate follow up.  This format is felt to be most appropriate for this patient at this time.  The patient did not have access to video technology/had technical difficulties with video requiring transitioning to audio format only (telephone).  All issues noted in this document were discussed and addressed.  No physical exam could be performed with this format.  Please refer to the patient's chart for her  consent to telehealth for [facility].   Date:  03/23/2019   ID:  Marcelino Freestone, DOB 22-Aug-1966, MRN [id number]  Patient Location: Home Provider Location: Office  PCP:  Redmond School, MD  Cardiologist:  Rozann Lesches, MD  Electrophysiologist:  None   Evaluation Performed:  Follow-Up Visit  Chief Complaint:  Hospital Follow-Up  History of Present Illness:    Summer Cook is a 53 y.o. female with past medical history of CAD (s/p NSTEMI in 2018 with chronically occluded dPDA with medical management recommended), chronic combined systolic and diastolic CHF, HTN, HLD, prior CVA, prior PE, and ESRD (on HD) who presents for a hospital follow-up telehealth visit.   She most recently had a telehealth visit with Melina Copa, PA-C in 12/2018 following her recent hospitalization and denied any recent chest pain or dyspnea on exertion at that time. During her visit, Amlodipine was discontinued and Hydralazine was increased to 50 mg TID. Was continued on ASA, beta-blocker, Isordil and statin therapy.  In the interim, she was admitted to [facility] from 03/09/2019 to 03/14/2019 for volume overload in the setting of missing her dialysis  sessions. She was also found to have a UTI during admission and was appropriately treated with antibiotic therapy. By review of her discharge summary, it appears no changes were made to her cardiac medication regimen at that time.  In talking with the patient today, she reports her breathing has overall been stable since resuming dialysis following recent admission. She was confused as to if she would require dialysis temporarily or if this would be permanent following her initial admission in 12/2018. Her nephrologist is Dr. Juleen China in [place].   She denies any recent chest pain, palpitations, dyspnea on exertion, orthopnea, or PND.  She does experience some intermittent lower extremity edema. Had previously been taking all of her prescribed medications the morning of dialysis but now holds Hydralazine the morning of her sessions and takes Toprol-XL in the evening due to intermittent hypotension. BP was stable at 122/84 on her most recent check..  The patient does not have symptoms concerning for COVID-19 infection (fever, chills, cough, or new shortness of breath).    Past Medical History:  Diagnosis Date   Allergy    Anxiety    Asthma    CAD (coronary artery disease), native coronary artery    a. cath 05/20/17 - 100% distal PDA --> medical mangement (no intervention done)   Candida esophagitis (HCC)    Chronic combined systolic and diastolic CHF (congestive heart failure) ([place])    Depression    End stage renal disease on dialysis ([place])    T/ Th/ Sat starting 4/28- [facility]   Essential hypertension, benign    GERD (gastroesophageal reflux disease)    Gout  Headache    Hemorrhoids    ITP (idiopathic thrombocytopenic purpura) 08/2010   Treated with Prednisone, IVIg (transient response), Rituxan (no response), Cytoxan (no response).  Last was Prednisone '40mg'$ ; 2 wk in 10/2012.  She also was on Prednisone bridged with Cellcept briefly but stopped due to lack of  insurance.    Myocardial infarction [facility])    Perinephric hematoma    Pneumonia    Pulmonary embolism ([place]) 04/2012   Serrated adenoma of colon    Steroid-induced diabetes ([place])    Stroke ([place])    Thrush, oral 05/29/2011   Past Surgical History:  Procedure Laterality Date   AV FISTULA PLACEMENT Right 01/23/2019   Procedure: ARTERIOVENOUS (AV) FISTULA CREATION RIGHT ARM;  Surgeon: Elam Dutch, MD;  Location: [place];  Service: Vascular;  Laterality: Right;   AV FISTULA PLACEMENT Right 01/30/2019   Procedure: GORETEX GRAFT PLACEMENT  RIGHT ARM;  Surgeon: Elam Dutch, MD;  Location: [place];  Service: Vascular;  Laterality: Right;   BONE MARROW BIOPSY     CARDIAC CATHETERIZATION     CARPAL TUNNEL RELEASE     CARPAL TUNNEL RELEASE Left 05/20/2016   Procedure: CARPAL TUNNEL RELEASE;  Surgeon: Carole Civil, MD;  Location: [facility];  Service: Orthopedics;  Laterality: Left;   CARPAL TUNNEL RELEASE Right 07/16/2016   Procedure: RIGHT CARPAL TUNNEL RELEASE;  Surgeon: Carole Civil, MD;  Location: [facility];  Service: Orthopedics;  Laterality: Right;   CATARACT EXTRACTION     CHOLECYSTECTOMY  2008   COLONOSCOPY WITH ESOPHAGOGASTRODUODENOSCOPY (EGD) N/A 01/04/2013   Dr. Gala Romney: esophageal plaques with +KOH, hh. Gastric antrum abnormal, bx reactive gastropathy. Anal canal hemorrhoids, colonic tics, normal TI, single polyp (sessile serrated tubular adenoma). Next TCS 12/2017   IR FLUORO GUIDE CV LINE RIGHT  01/10/2019   IR US GUIDE VASC ACCESS RIGHT  01/10/2019   LEFT HEART CATH AND CORONARY ANGIOGRAPHY N/A 05/20/2017   Procedure: LEFT HEART CATH AND CORONARY ANGIOGRAPHY;  Surgeon: Martinique, Peter M, MD;  Location: [place] [facility];  Service: Cardiovascular;  Laterality: N/A;   SPLENECTOMY, TOTAL  01/2011   TEE WITHOUT CARDIOVERSION N/A 01/03/2016   Procedure: TRANSESOPHAGEAL ECHOCARDIOGRAM (TEE) WITH PROPOFOL;  Surgeon: Satira Sark, MD;  [facility];   Service: Cardiovascular;  Laterality: N/A;     Current Meds  Medication Sig   acetaminophen (TYLENOL) 500 MG tablet Take 1,000 mg by mouth every 6 (six) hours as needed for moderate pain.   albuterol (PROVENTIL) (2.5 MG/3ML) 0.083% nebulizer solution Take 3 mLs (2.5 mg total) by nebulization every 4 (four) hours as needed for shortness of breath.   aspirin EC 81 MG EC tablet Take 1 tablet (81 mg total) by mouth daily.   atorvastatin (LIPITOR) 40 MG tablet Take 1 tablet (40 mg total) by mouth daily at 6 PM.   Cyanocobalamin (B-12 PO) Take 1 tablet by mouth daily.   diclofenac sodium (VOLTAREN) 1 % GEL Apply 2 g topically 4 (four) times daily.   hydrALAZINE (APRESOLINE) 50 MG tablet Take 1 tablet (50 mg total) by mouth 3 (three) times daily. (Patient taking differently: Take 50 mg by mouth 2 (two) times a day. )   HYDROcodone-acetaminophen (NORCO) 5-325 MG tablet Take 1 tablet by mouth every 6 (six) hours as needed for moderate pain.   isosorbide dinitrate (ISORDIL) 10 MG tablet Take 1 tablet (10 mg total) by mouth 2 (two) times daily.   lidocaine-prilocaine (EMLA) cream Apply 1 application topically  as needed.   loratadine (CLARITIN) 10 MG tablet Take 10 mg by mouth daily as needed for allergies.   metoprolol succinate (TOPROL-XL) 50 MG 24 hr tablet Take 50 mg by mouth daily. Take with or immediately following a meal.   nitroGLYCERIN (NITROSTAT) 0.4 MG SL tablet Place 1 tablet (0.4 mg total) under the tongue every 5 (five) minutes x 3 doses as needed for chest pain.   sertraline (ZOLOFT) 100 MG tablet Take 100 mg by mouth daily.   venlafaxine (EFFEXOR) 37.5 MG tablet Take 37.5 mg by mouth 2 (two) times daily.     Allergies:   Ciprofloxacin, Ceftriaxone, Dilaudid [hydromorphone hcl], Yellow jacket venom, Adhesive [tape], Brintellix [vortioxetine], Cymbalta [duloxetine hcl], and Penicillins   Social History   Tobacco Use   Smoking status: Former Smoker    Years: 0.00     Types: Cigarettes    Quit date: 02/14/2009    Years since quitting: 10.1   Smokeless tobacco: Current User    Types: Snuff   Tobacco comment:  snus pouch   Substance Use Topics   Alcohol use: No    Alcohol/week: 0.0 standard drinks   Drug use: No     Family Hx: The patient's family history includes AAA (abdominal aortic aneurysm) in her paternal grandmother; Barrett's esophagus in her paternal aunt; Breast cancer in her paternal grandmother; Cervical cancer in her mother; Colon cancer (age of onset: 39) in her paternal grandfather; Colon cancer (age of onset: 85) in her paternal grandmother; Colon polyps in her father; Lung cancer in her father; Pneumonia in her brother. There is no history of Stomach cancer or Pancreatic cancer.  ROS:   Please see the history of present illness.     All other systems reviewed and are negative.   Prior CV studies:   The following studies were reviewed today:  Cardiac Catheterization: 04/2017  RPDA lesion, 100 %stenosed.  The left ventricular systolic function is normal.  LV end diastolic pressure is normal.  The left ventricular ejection fraction is 55-65% by visual estimate.   1. 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 Otherwise normal coronary arteries. 3. Normal LV function with very small focal area of distal inferior hypokinesis.  Plan: medical therapy.  Echocardiogram: 12/2018 IMPRESSIONS   1. The left ventricle has moderately reduced systolic function, with an ejection fraction of 35-40%. The cavity size was mildly dilated. Left ventricular diastolic Doppler parameters are consistent with pseudonormalization. Left ventricular diffuse  hypokinesis.  2. The right ventricle has normal systolic function. The cavity was normal. Right ventricular systolic pressure is mildly elevated with an estimated pressure of 34.4 mmHg.  3. The mitral valve is grossly normal. Mild thickening of the mitral valve leaflet. Mitral  valve regurgitation is moderate by color flow Doppler.  4. The tricuspid valve is grossly normal.  5. The aortic valve is tricuspid. Mild thickening of the aortic valve. Aortic valve regurgitation is trivial by color flow Doppler.  6. The inferior vena cava was dilated in size with <50% respiratory variability.  7. Moderate LV dysfunction; mild LVE; moderate diastolic dysfunction; moderate MR; mild TR; mild pulmonary hypertension.  NST: 12/2018  Defect 1: There is a small defect present in the apical anterior location.  Findings consistent with prior myocardial infarction.  This is an intermediate risk study.  The left ventricular ejection fraction is moderately decreased (30-44%).   There is a small area of irreversible defect in the apical anterior wall consistent with prior infarct and  no ischemia. LVEF 37% with diffuse hypokinesis.   Labs/Other Tests and Data Reviewed:    EKG:  No ECG reviewed.  Recent Labs: 04/28/2018: B Natriuretic Peptide 39.0 03/09/2019: ALT 14 03/13/2019: Hemoglobin 9.9; Platelets 174 03/14/2019: BUN 31; Creatinine, Ser 3.49; Magnesium 2.0; Potassium 3.5; Sodium 140   Recent Lipid Panel Lab Results  Component Value Date/Time   CHOL 229 (H) 05/19/2017 07:12 PM   TRIG 112 01/10/2019 02:34 AM   HDL 57 05/19/2017 07:12 PM   CHOLHDL 4.0 05/19/2017 07:12 PM   LDLCALC 149 (H) 05/19/2017 07:12 PM    Wt Readings from Last 3 Encounters:  03/14/19 175 lb 11.3 oz (79.7 kg)  01/30/19 175 lb (79.4 kg)  01/26/19 174 lb (78.9 kg)     Objective:    Vital Signs:  BP 122/84    Pulse 84    LMP 08/13/2011    General: Pleasant female sounding in NAD Psych: Normal affect. Neuro: Alert and oriented X 3. Lungs:  Resp regular and unlabored while talking on the phone.   ASSESSMENT & PLAN:    1. Chronic Combined Systolic and Diastolic CHF/ NICM - she had a catheterization in 2018 which showed a chronically occluded dPDA with medical management recommended. Recent echo  in 12/2018 showed a reduced EF of 35-40% with NST showing no significant ischemia.  - was recently admitted for volume overload after skipping several HD sessions. Her volume status has been stable since being compliant with HD. No recent dyspnea on exertion, orthopnea, PND, or edema. - continue Toprol-XL '50mg'$  daily, Isordil '10mg'$  BID, and Hydralazine '50mg'$  BID. Not on ACE-I/ARB/ARNI given ESRD. Will plan for a repeat echo in 04/2019 to reassess her EF.   2. CAD - she has a known chronically occluded dPDA with medical management recommended. Recent NST in 12/2018 showed no significant ischemia as outlined above.  - denies any recent chest pain or dyspnea on exertion.  - continue ASA, statin, and BB therapy.   3. HTN - BP has been well-controlled, at 122/84 on most recent check. She does hold her Hydralazine the mornings of HD. Recommended holding her AM Isordil if hypotension continues to pose an issue during HD. Continue current medication regimen.   4. HLD - followed by PCP. Goal LDL is less than 70. Remains on Atorvastatin '40mg'$  daily.  5. ESRD - on HD - MWF schedule.  - followed by Dr. Juleen China in [place].   6. COVID-19 Education - The signs and symptoms of COVID-19 were discussed with the patient, The importance of social distancing was discussed today.  Time:   Today, I have spent 26 minutes with the patient with telehealth technology discussing the above problems.     Medication Adjustments/Labs and Tests Ordered: Current medicines are reviewed at length with the patient today.  Concerns regarding medicines are outlined above.   Tests Ordered: Orders Placed This Encounter  Procedures   ECHOCARDIOGRAM COMPLETE    Medication Changes: No orders of the defined types were placed in this encounter.   Follow Up: With Dr. Domenic Polite in 04/2019 after echo has been obtained.   Signed, Erma Heritage, PA-C  03/23/2019 8:11 PM    [facility]

## 2019-03-23 NOTE — Patient Instructions (Addendum)
Medication Instructions: Your physician recommends that you continue on your current medications as directed. Please refer to the Current Medication list given to you today.   Labwork: none  Procedures/Testing: Your physician has requested that you have an echocardiogram. Echocardiography is a painless test that uses sound waves to create images of your heart. It provides your doctor with information about the size and shape of your heart and how well your heart's chambers and valves are working. This procedure takes approximately one hour. There are no restrictions for this procedure.    Follow-Up: In August with Dr.McDowell after you have your echocardiogram  Any Additional Special Instructions Will Be Listed Below (If Applicable).     If you need a refill on your cardiac medications before your next appointment, please call your pharmacy.

## 2019-03-25 DIAGNOSIS — Z992 Dependence on renal dialysis: Secondary | ICD-10-CM | POA: Diagnosis not present

## 2019-03-25 DIAGNOSIS — N186 End stage renal disease: Secondary | ICD-10-CM | POA: Diagnosis not present

## 2019-03-27 DIAGNOSIS — N186 End stage renal disease: Secondary | ICD-10-CM | POA: Diagnosis not present

## 2019-03-27 DIAGNOSIS — Z992 Dependence on renal dialysis: Secondary | ICD-10-CM | POA: Diagnosis not present

## 2019-03-28 DIAGNOSIS — Z992 Dependence on renal dialysis: Secondary | ICD-10-CM | POA: Diagnosis not present

## 2019-03-28 DIAGNOSIS — N186 End stage renal disease: Secondary | ICD-10-CM | POA: Diagnosis not present

## 2019-03-29 DIAGNOSIS — Z1389 Encounter for screening for other disorder: Secondary | ICD-10-CM | POA: Diagnosis not present

## 2019-03-29 DIAGNOSIS — E119 Type 2 diabetes mellitus without complications: Secondary | ICD-10-CM | POA: Diagnosis not present

## 2019-03-29 DIAGNOSIS — N186 End stage renal disease: Secondary | ICD-10-CM | POA: Diagnosis not present

## 2019-03-29 DIAGNOSIS — Z992 Dependence on renal dialysis: Secondary | ICD-10-CM | POA: Diagnosis not present

## 2019-03-29 DIAGNOSIS — I10 Essential (primary) hypertension: Secondary | ICD-10-CM | POA: Diagnosis not present

## 2019-03-29 DIAGNOSIS — G894 Chronic pain syndrome: Secondary | ICD-10-CM | POA: Diagnosis not present

## 2019-03-30 ENCOUNTER — Other Ambulatory Visit: Payer: Self-pay

## 2019-03-30 NOTE — Patient Outreach (Signed)
[place] [facility]) Care Management  03/30/2019  Summer Cook 04-07-1966 [id number]    Attempted outreach with Summer Cook to complete a telephonic assessment. Outreach unsuccessful. Phone rang without option to leave a voice message.  PLAN -Will follow-up within 3-4 business days.   [place] [phone]

## 2019-03-31 DIAGNOSIS — Z992 Dependence on renal dialysis: Secondary | ICD-10-CM | POA: Diagnosis not present

## 2019-03-31 DIAGNOSIS — N186 End stage renal disease: Secondary | ICD-10-CM | POA: Diagnosis not present

## 2019-04-03 DIAGNOSIS — Z992 Dependence on renal dialysis: Secondary | ICD-10-CM | POA: Diagnosis not present

## 2019-04-03 DIAGNOSIS — N186 End stage renal disease: Secondary | ICD-10-CM | POA: Diagnosis not present

## 2019-04-04 DIAGNOSIS — Z452 Encounter for adjustment and management of vascular access device: Secondary | ICD-10-CM | POA: Diagnosis not present

## 2019-04-05 DIAGNOSIS — Z992 Dependence on renal dialysis: Secondary | ICD-10-CM | POA: Diagnosis not present

## 2019-04-05 DIAGNOSIS — N186 End stage renal disease: Secondary | ICD-10-CM | POA: Diagnosis not present

## 2019-04-06 ENCOUNTER — Other Ambulatory Visit: Payer: Self-pay

## 2019-04-06 NOTE — Patient Outreach (Signed)
[place] [facility]) Care Management  04/06/2019  AVABELLA WAILES 1966/02/12 [id number]    Unsuccessful outreach attempt. Left voice message requesting a return call.   PLAN -Will send unsuccessful outreach letter. -Will follow-up next week.   [place] [phone]

## 2019-04-07 DIAGNOSIS — N186 End stage renal disease: Secondary | ICD-10-CM | POA: Diagnosis not present

## 2019-04-07 DIAGNOSIS — Z992 Dependence on renal dialysis: Secondary | ICD-10-CM | POA: Diagnosis not present

## 2019-04-10 DIAGNOSIS — E119 Type 2 diabetes mellitus without complications: Secondary | ICD-10-CM | POA: Diagnosis not present

## 2019-04-10 DIAGNOSIS — N186 End stage renal disease: Secondary | ICD-10-CM | POA: Diagnosis not present

## 2019-04-10 DIAGNOSIS — Z992 Dependence on renal dialysis: Secondary | ICD-10-CM | POA: Diagnosis not present

## 2019-04-10 DIAGNOSIS — D509 Iron deficiency anemia, unspecified: Secondary | ICD-10-CM | POA: Diagnosis not present

## 2019-04-10 DIAGNOSIS — N2581 Secondary hyperparathyroidism of renal origin: Secondary | ICD-10-CM | POA: Diagnosis not present

## 2019-04-12 DIAGNOSIS — Z992 Dependence on renal dialysis: Secondary | ICD-10-CM | POA: Diagnosis not present

## 2019-04-12 DIAGNOSIS — N186 End stage renal disease: Secondary | ICD-10-CM | POA: Diagnosis not present

## 2019-04-13 ENCOUNTER — Other Ambulatory Visit: Payer: Self-pay

## 2019-04-13 NOTE — Patient Outreach (Signed)
[place] [facility]) Care Management  04/13/2019  Summer Cook Feb 11, 1966 [id number]  Successful outreach with Summer Cook. She reports being "very tired" today. Reports that her dialysis session was discontinued on yesterday due to hypotension. Reports that her blood pressure eventually improved. She is not as lethargic as she was on yesterday, but still fatigued. Denies complaints of dizziness or lightheadedness. Denies falls.  She prefers to complete the assessment at another time. Denies urgent concerns or immediate care management needs. Agreeable to follow-up outreach on Monday at [place] -Will follow up on 04/17/19.   [place] [phone]

## 2019-04-14 DIAGNOSIS — Z992 Dependence on renal dialysis: Secondary | ICD-10-CM | POA: Diagnosis not present

## 2019-04-14 DIAGNOSIS — N186 End stage renal disease: Secondary | ICD-10-CM | POA: Diagnosis not present

## 2019-04-17 ENCOUNTER — Other Ambulatory Visit: Payer: Self-pay

## 2019-04-17 DIAGNOSIS — T82868A Thrombosis of vascular prosthetic devices, implants and grafts, initial encounter: Secondary | ICD-10-CM | POA: Diagnosis not present

## 2019-04-17 DIAGNOSIS — Z992 Dependence on renal dialysis: Secondary | ICD-10-CM | POA: Diagnosis not present

## 2019-04-17 DIAGNOSIS — N186 End stage renal disease: Secondary | ICD-10-CM | POA: Diagnosis not present

## 2019-04-17 DIAGNOSIS — I871 Compression of vein: Secondary | ICD-10-CM | POA: Diagnosis not present

## 2019-04-17 NOTE — Patient Outreach (Signed)
[place] [facility]) Care Management  04/17/2019   Summer Cook 03-19-1966 [id number]  Subjective:  Outreach for completion of telephonic assessment.  Objective:  Current Medications:  Current Outpatient Medications  Medication Sig Dispense Refill  . acetaminophen (TYLENOL) 500 MG tablet Take 1,000 mg by mouth every 6 (six) hours as needed for moderate pain.    Marland Kitchen albuterol (PROVENTIL) (2.5 MG/3ML) 0.083% nebulizer solution Take 3 mLs (2.5 mg total) by nebulization every 4 (four) hours as needed for shortness of breath. 75 mL 12  . ALPRAZolam (XANAX) 0.25 MG tablet Take 1 tablet (0.25 mg total) by mouth at bedtime as needed for anxiety. (Patient not taking: Reported on 03/23/2019) 30 tablet 0  . aspirin EC 81 MG EC tablet Take 1 tablet (81 mg total) by mouth daily.    Marland Kitchen atorvastatin (LIPITOR) 40 MG tablet Take 1 tablet (40 mg total) by mouth daily at 6 PM. 30 tablet 3  . Cyanocobalamin (B-12 PO) Take 1 tablet by mouth daily.    . diclofenac sodium (VOLTAREN) 1 % GEL Apply 2 g topically 4 (four) times daily.    . diphenhydrAMINE (BENADRYL ALLERGY) 25 mg capsule Take 1 capsule (25 mg total) by mouth every 8 (eight) hours as needed for up to 4 days for itching or allergies. 10 capsule 0  . famotidine (PEPCID) 20 MG tablet Take 1 tablet (20 mg total) by mouth daily for 4 days. 4 tablet 0  . hydrALAZINE (APRESOLINE) 50 MG tablet Take 1 tablet (50 mg total) by mouth 3 (three) times daily. (Patient taking differently: Take 50 mg by mouth 2 (two) times a day. ) 270 tablet 3  . HYDROcodone-acetaminophen (NORCO) 5-325 MG tablet Take 1 tablet by mouth every 6 (six) hours as needed for moderate pain. 10 tablet 0  . isosorbide dinitrate (ISORDIL) 10 MG tablet Take 1 tablet (10 mg total) by mouth 2 (two) times daily. 180 tablet 3  . lidocaine-prilocaine (EMLA) cream Apply 1 application topically as needed.    . loratadine (CLARITIN) 10 MG tablet Take 10 mg by mouth daily as needed for  allergies.    . metoprolol succinate (TOPROL-XL) 50 MG 24 hr tablet Take 50 mg by mouth daily. Take with or immediately following a meal.    . nitroGLYCERIN (NITROSTAT) 0.4 MG SL tablet Place 1 tablet (0.4 mg total) under the tongue every 5 (five) minutes x 3 doses as needed for chest pain. 25 tablet 12  . sertraline (ZOLOFT) 100 MG tablet Take 100 mg by mouth daily.    Marland Kitchen venlafaxine (EFFEXOR) 37.5 MG tablet Take 37.5 mg by mouth 2 (two) times daily.     No current facility-administered medications for this visit.     Functional Status:  In your present state of health, do you have any difficulty performing the following activities: 04/17/2019 03/09/2019  Hearing? N N  Vision? N N  Difficulty concentrating or making decisions? N N  Walking or climbing stairs? N N  Dressing or bathing? N N  Doing errands, shopping? N N  Preparing Food and eating ? N -  Using the Toilet? N -  In the past six months, have you accidently leaked urine? N -  Do you have problems with loss of bowel control? N -  Managing your Medications? N -  Managing your Finances? N -  Housekeeping or managing your Housekeeping? Y -  Comment Sometimes due to fatigue. -  Some recent data might be hidden    Fall/Depression  Screening: Fall Risk  04/17/2019 04/09/2016 04/02/2016  Falls in the past year? 0 No No  Number falls in past yr: - - -  Injury with Fall? - - -  Risk for fall due to : Medication side effect;Other (Comment) - -  Risk for fall due to: Comment Frequently fatigued. - -  Follow up Falls prevention discussed - -   PHQ 2/9 Scores 04/17/2019 04/18/2018 04/02/2016  PHQ - 2 Score 0 0 4  PHQ- 9 Score - - 12    Assessment:  Telephonic assessment complete. Summer Cook reports being very fatigued. She did not receive dialysis today due to malfunctions with her AV fistula. She was referred to [facility] for evaluation and treatment. She will complete dialysis on tomorrow.   Discussed medications and treatment  recommendations. Reports taking medications as prescribed. Denies concerns regarding medication management or affordability. She reports chronic fatigue but attempts to attend all appointments and MD follow-ups as scheduled.   Reports recently discussing option for peritoneal dialysis with her Nephrologist and primary care provider. Initially she was agreeable, but reports declining this option due to concerns regarding peritonitis. She anticipates completing surgery/ living-donor kidney transplant. She does not want the process to be delayed due to infection related complications. She prefers to continue hemodialysis.  Summer Cook denies urgent concerns or immediate care management needs. She has multiple appointments during the week but reports that her daughter is available to assist. She denies need for transportation assistance. Denies need for nutritional assistance or community resource referrals. THN CM Care Plan Problem One     Most Recent Value  Care Plan Problem One  Risk for Admission   Role Documenting the Problem One  Care Management [place] for Problem One  Active  THN Long Term Goal   Over the next 90 days, patient will not be hospitalized for complications related to chronic illnesses.  THN Long Term Goal Start Date  04/17/19  Interventions for Problem One Long Term Goal  Discussed medications, nutrition, weight parameters, treatment recommendations and tolerance with Hemodialysis. Provided education regarding home safety and fall prevention.  THN CM Short Term Goal #1   Over the next 30 days, patient wil take all medications as prescribed.  THN CM Short Term Goal #1 Start Date  04/17/19  Interventions for Short Term Goal #1  Reviewed medications and indications for use. Discused medication tolerance.  [No concerns regarding medication management or affordability]  THN CM Short Term Goal #2   Over the next 30 days, patient will attend all MD appointments as scheduled.   THN CM Short Term Goal #2 Start Date  04/17/19  Interventions for Short Term Goal #2  Reviewed pending appointments. Discussed importance of attending as scheduled to prevent delays in care. Discussed options for transportation assistance. [Denied current need for transportation assistance.]  THN CM Short Term Goal #3  Over the next 30 days, patient will avoid activities that cause overexertion.  THN CM Short Term Goal #3 Start Date  04/17/19  Interventions for Short Tern Goal #3  Provided education regarding home safety and fall prevention. Encouraged to exercise caution, particularly after dialysis sessions. Encouraged to request assistance when needed and avoid strenous or prolonged activities.       PLAN -Will follow-up within two weeks.   [place] [phone]

## 2019-04-18 DIAGNOSIS — N186 End stage renal disease: Secondary | ICD-10-CM | POA: Diagnosis not present

## 2019-04-18 DIAGNOSIS — Z992 Dependence on renal dialysis: Secondary | ICD-10-CM | POA: Diagnosis not present

## 2019-04-18 IMAGING — MG DIGITAL SCREENING BILATERAL MAMMOGRAM WITH TOMO AND CAD
6 of 10 series · 6 of 26 positions shown · non-contrast
Comparison: Previous exam(s).

ACR Breast Density Category a: The breast tissue is almost entirely
fatty.

CLINICAL DATA: Screening.

EXAM:
DIGITAL SCREENING BILATERAL MAMMOGRAM WITH TOMO AND CAD

[R MLO (1 of 2)]
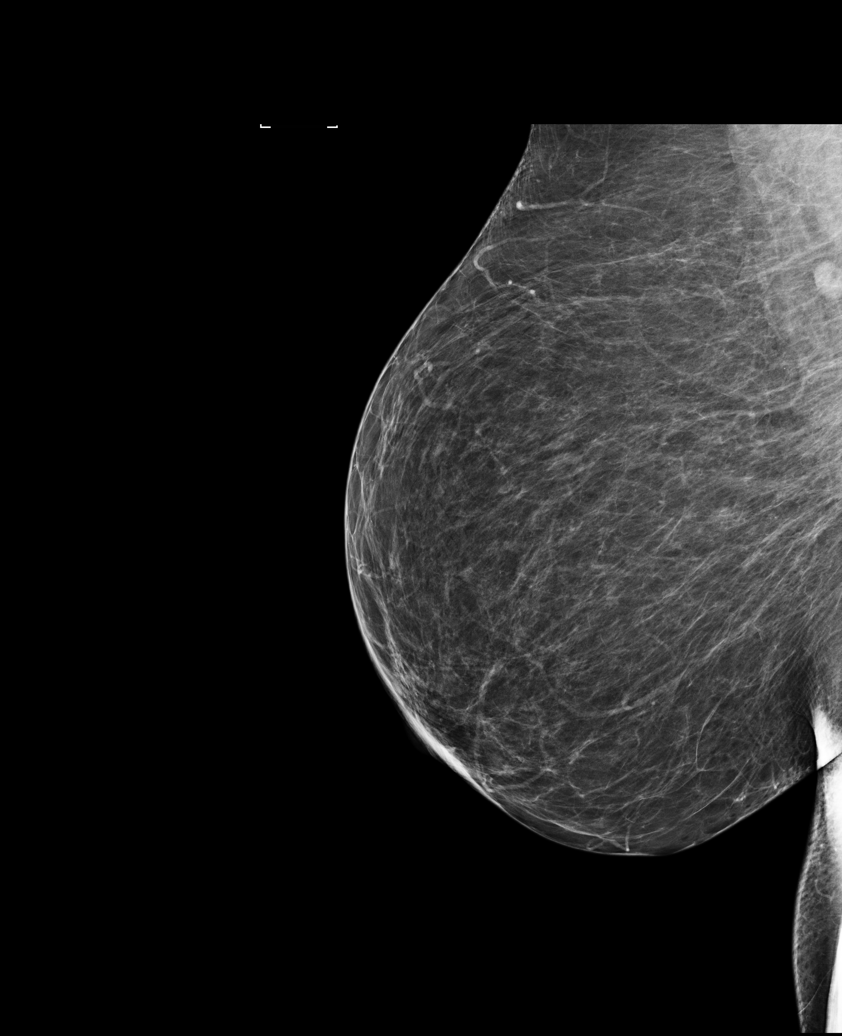

[R CC (1 of 2)]
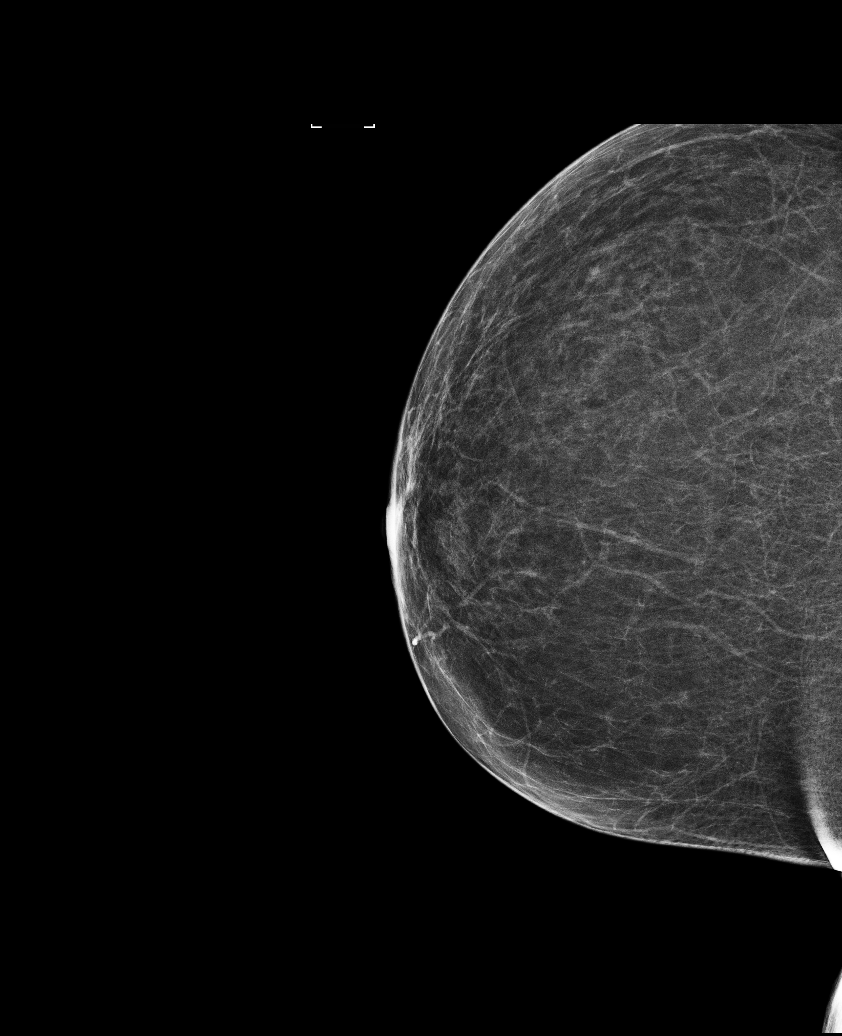

[R MLO (2 of 2)]
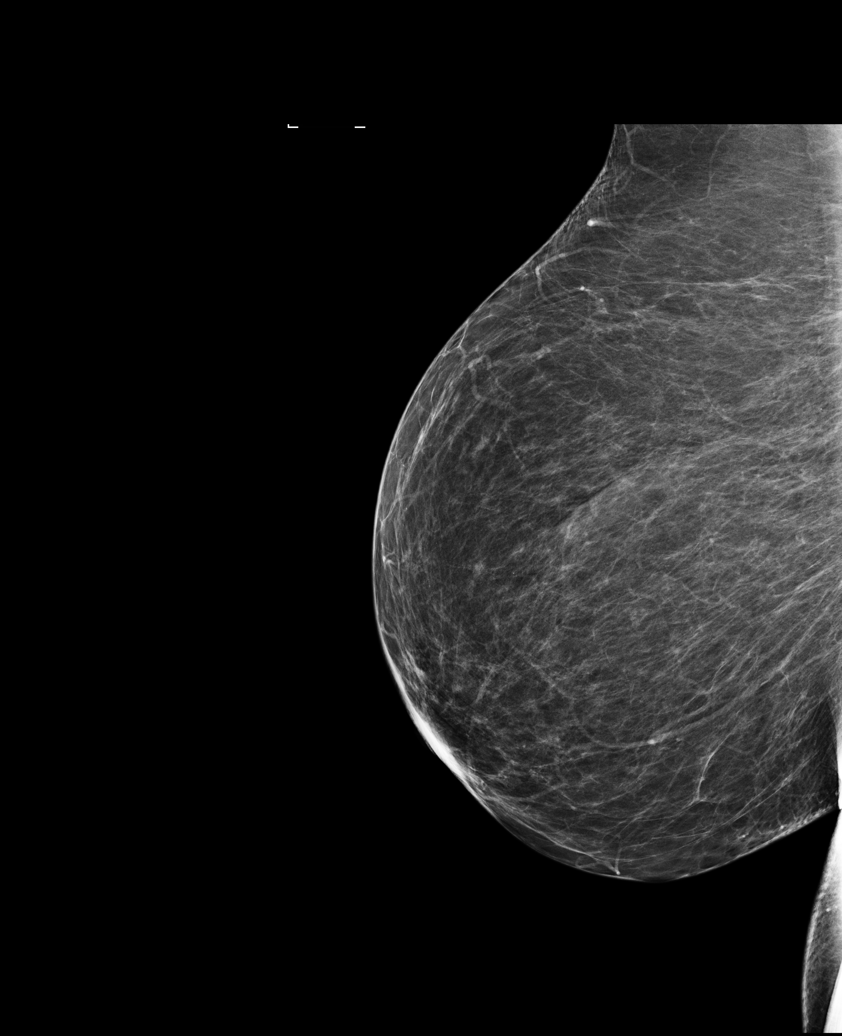

[R CC (2 of 2)]
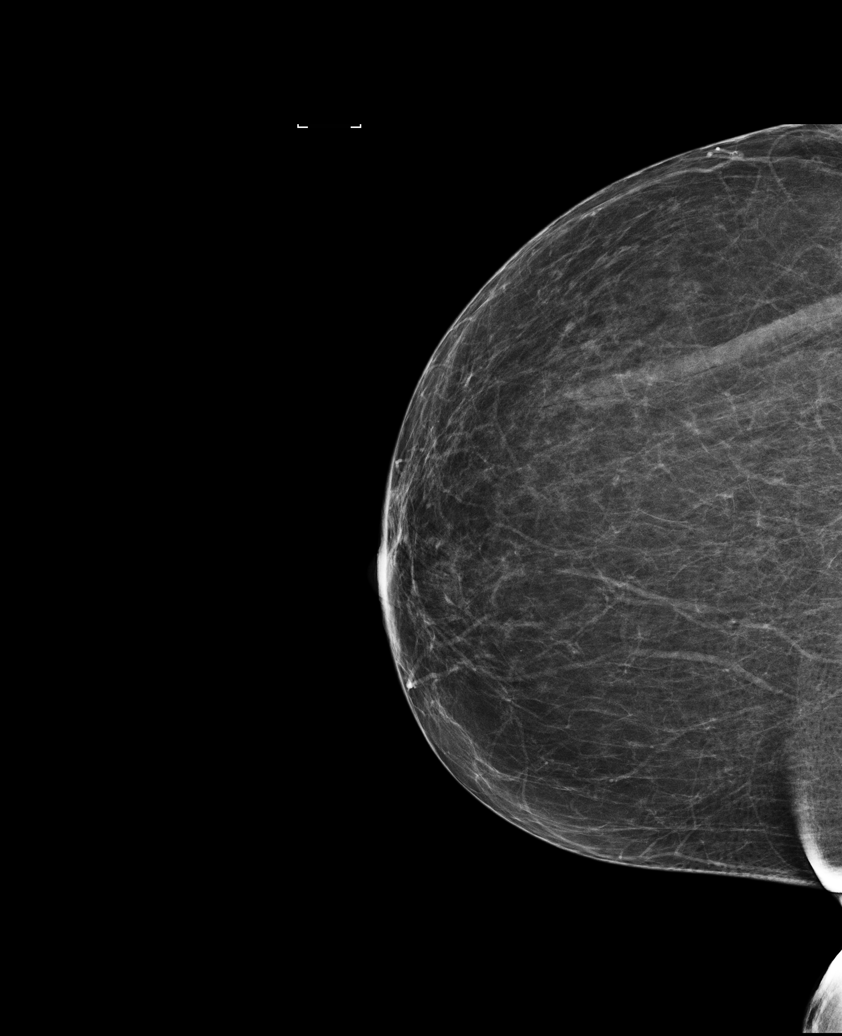

[L CC]
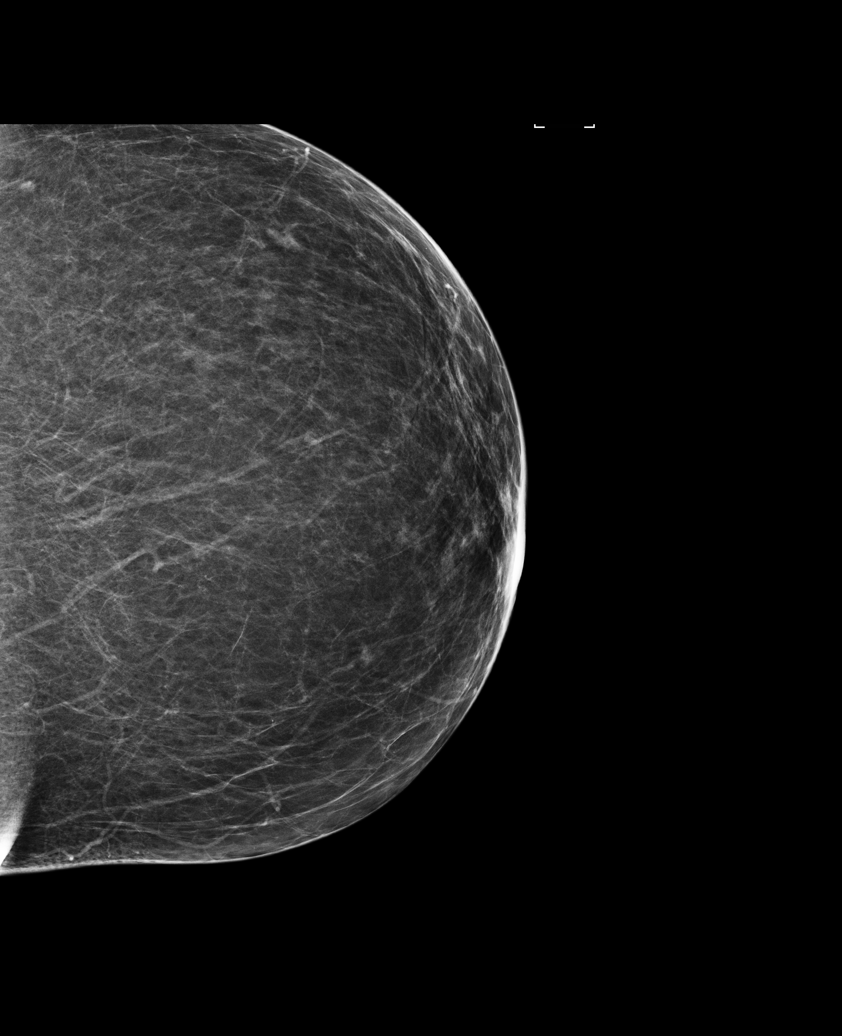

[L MLO]
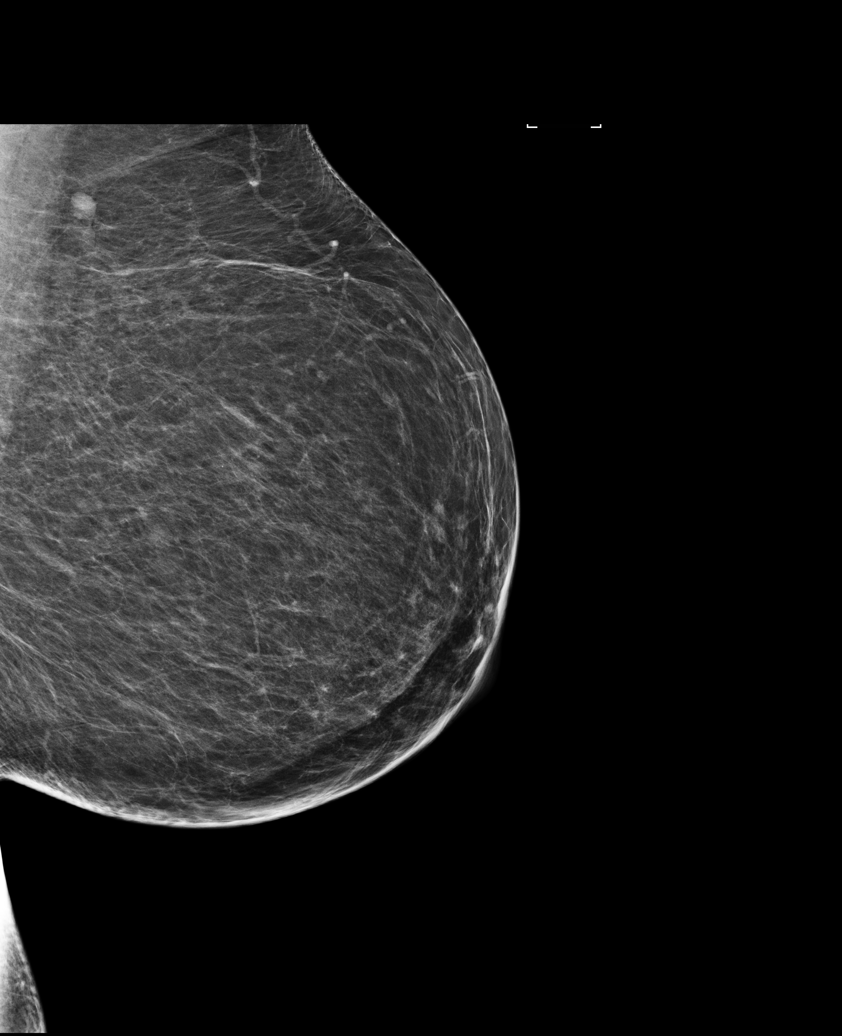

[6 of 26 positions shown; findings below may reference images not displayed]

FINDINGS: There are no findings suspicious for malignancy. Images were
processed with CAD.
IMPRESSION: No mammographic evidence of malignancy. A result letter of this
screening mammogram will be mailed directly to the patient.

RECOMMENDATION:
Screening mammogram in one year. (Code:8Y-Q-VVS)

BI-RADS CATEGORY  1: Negative.

## 2019-04-19 DIAGNOSIS — Z992 Dependence on renal dialysis: Secondary | ICD-10-CM | POA: Diagnosis not present

## 2019-04-19 DIAGNOSIS — N186 End stage renal disease: Secondary | ICD-10-CM | POA: Diagnosis not present

## 2019-04-19 IMAGING — DX DG CHEST 2V
2 series · 2 of 2 positions shown · non-contrast
Comparison: August 26, 2017 and July 12, 2017

CLINICAL DATA: Chest pain with nausea.

EXAM:
CHEST  2 VIEW

[chest pa]
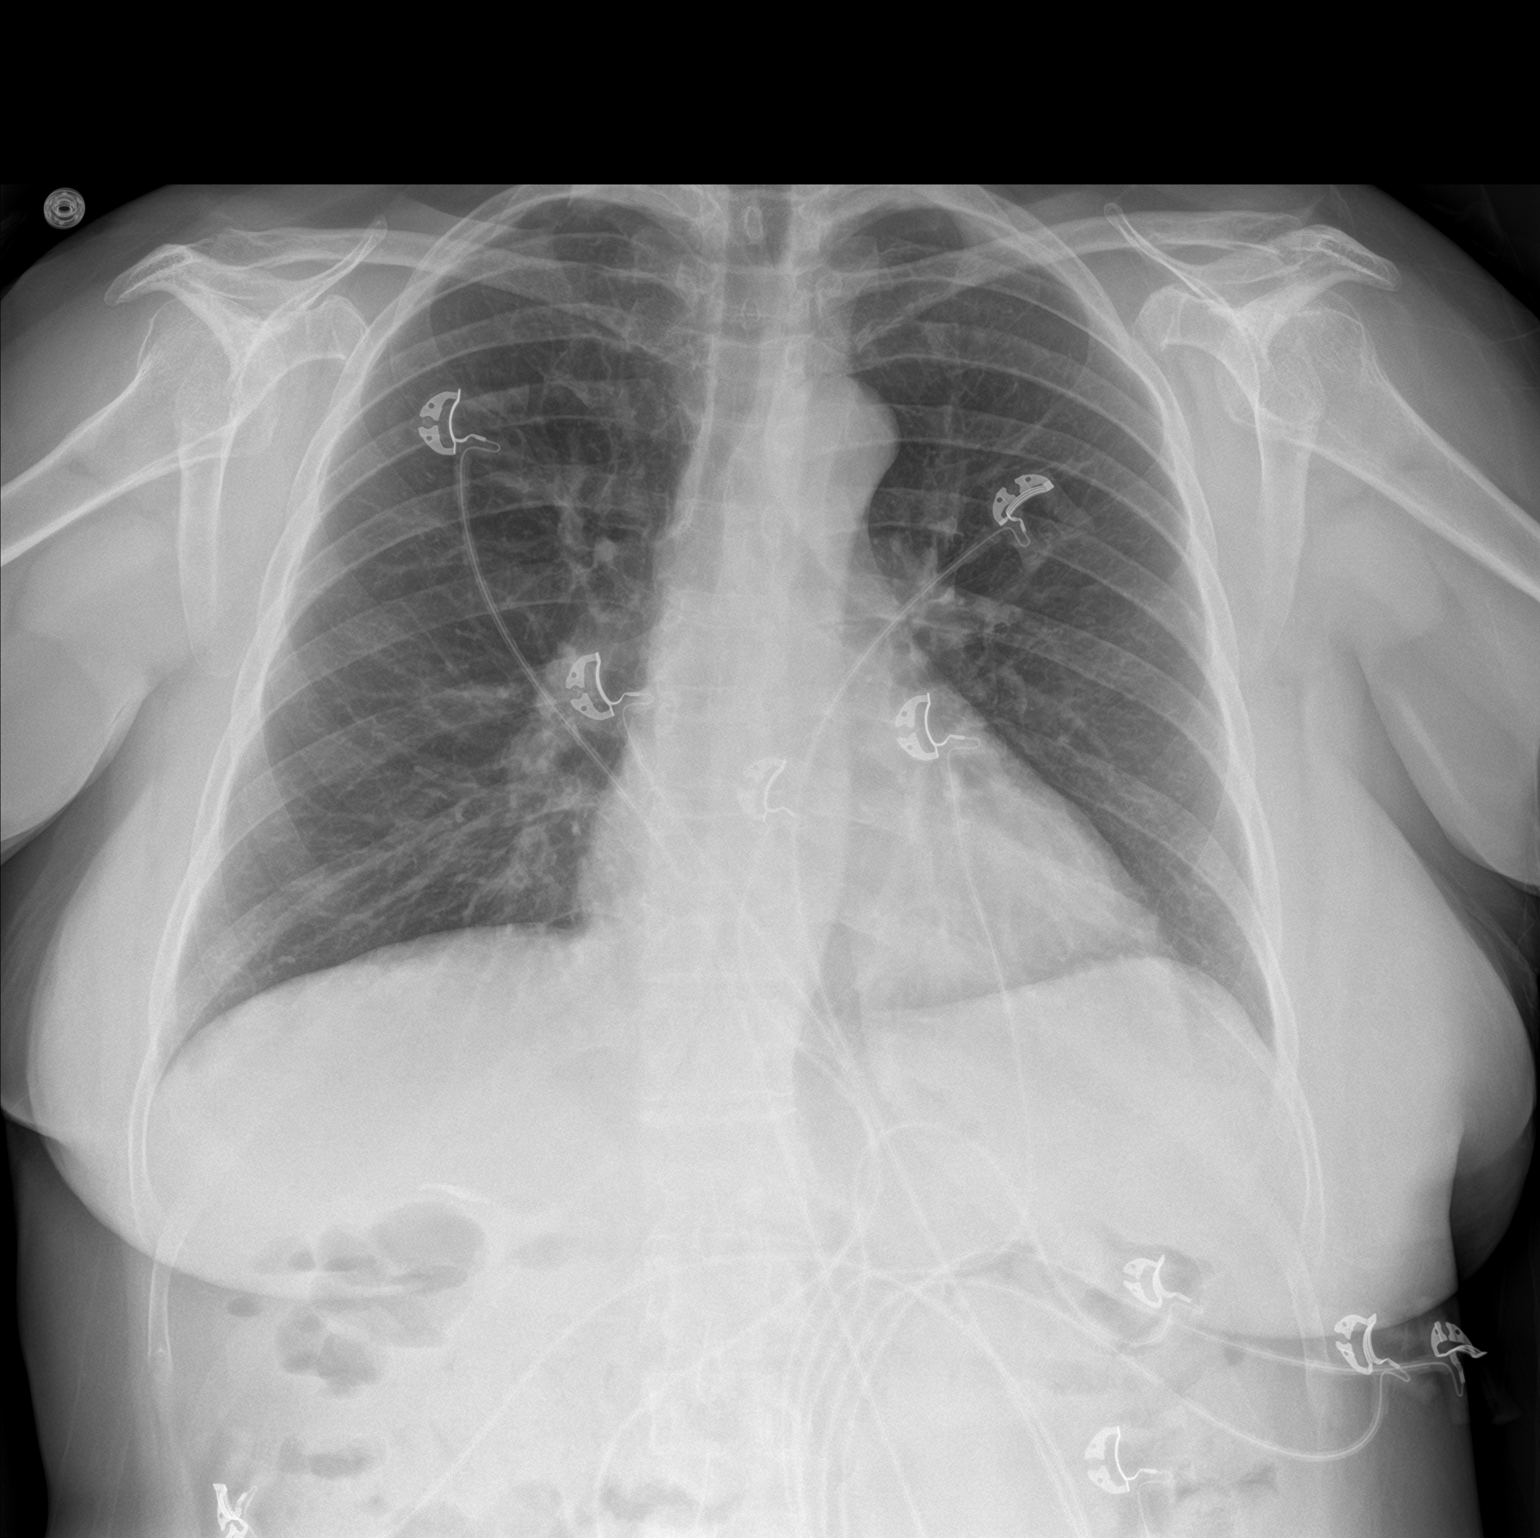

[chest lat]
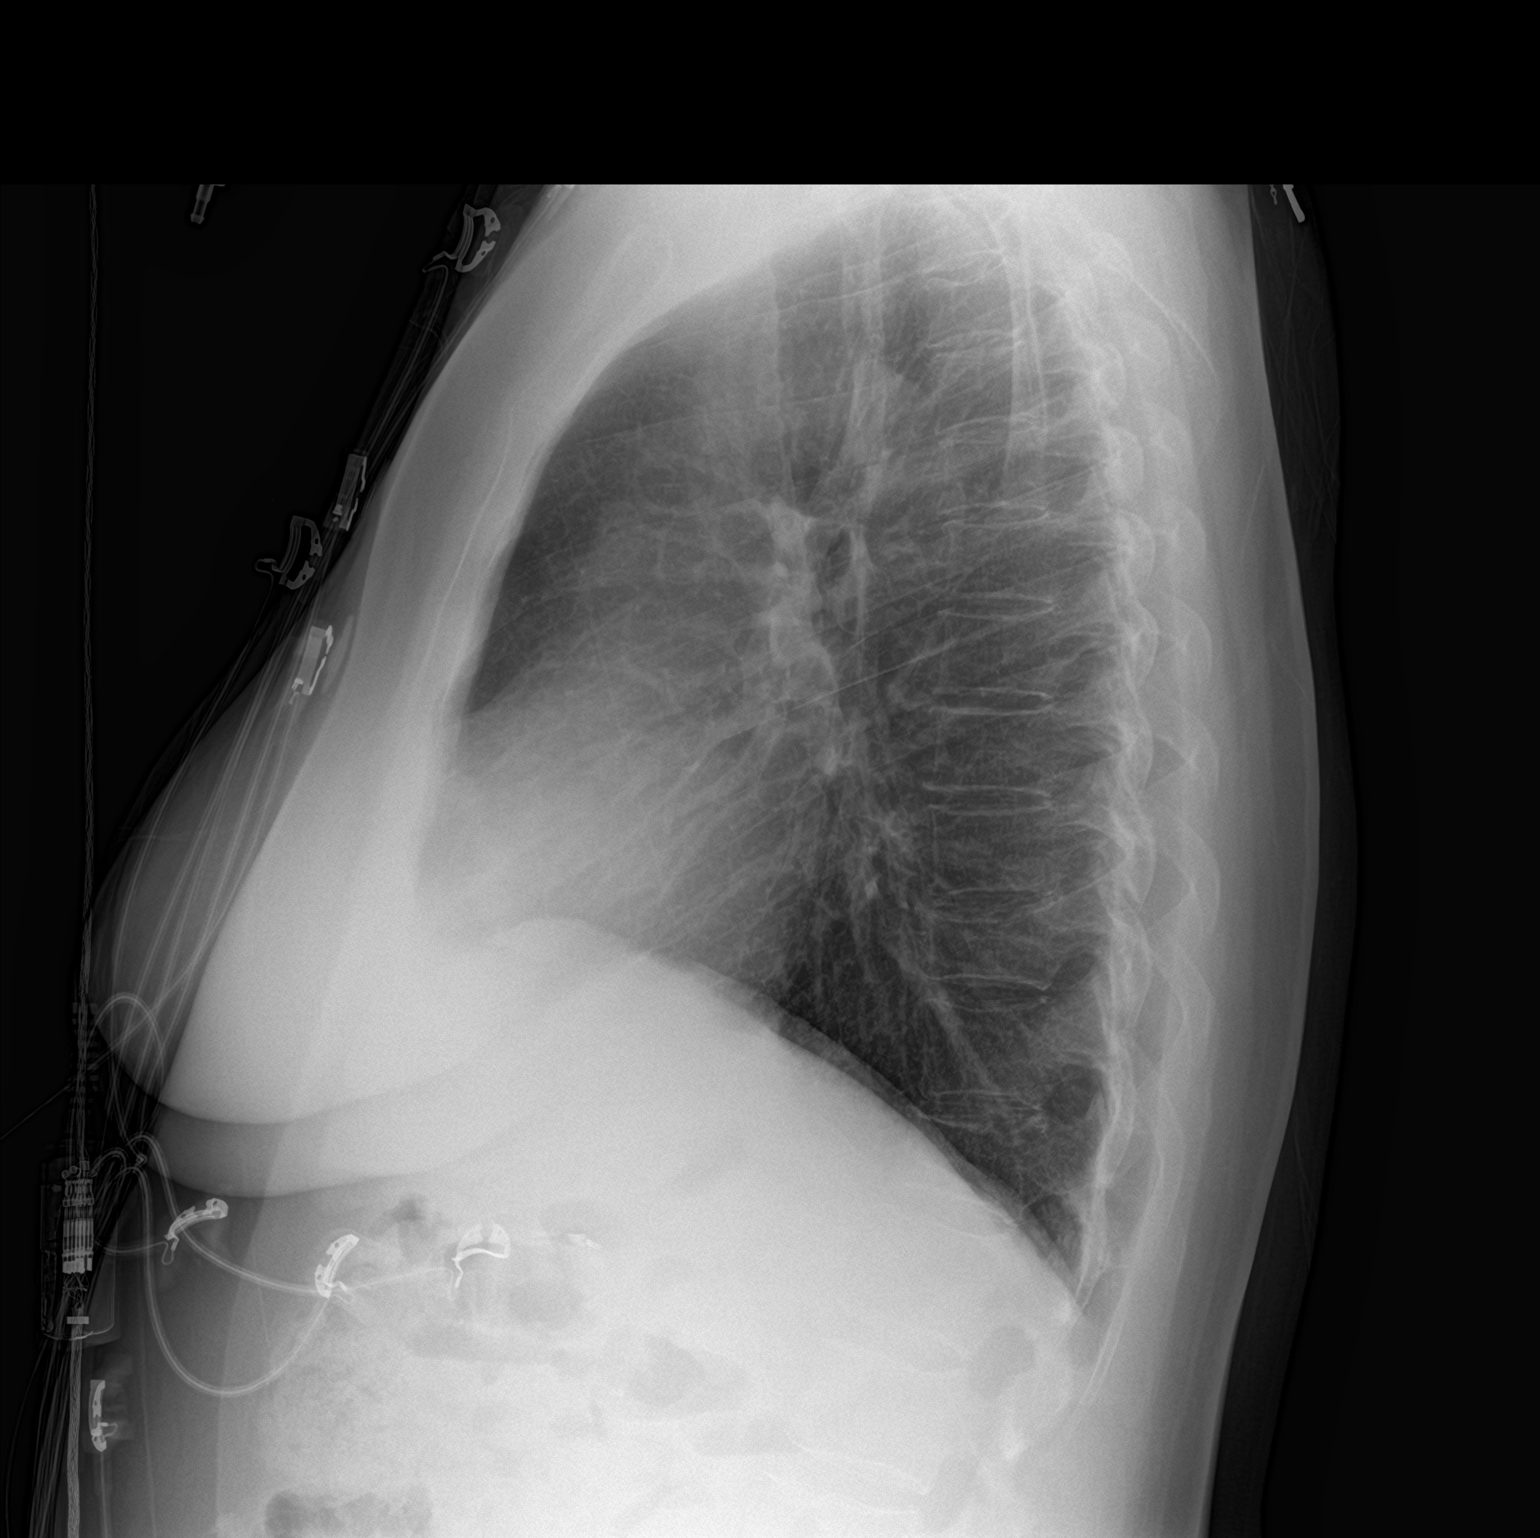

[2 of 2 positions shown; findings below may reference images not displayed]

FINDINGS: There is no edema or consolidation. The heart size and pulmonary
vascularity are normal. No adenopathy. There is slight anterior
wedging of the T10 and T12 vertebral bodies.
IMPRESSION: No edema or consolidation.

## 2019-04-21 DIAGNOSIS — N186 End stage renal disease: Secondary | ICD-10-CM | POA: Diagnosis not present

## 2019-04-21 DIAGNOSIS — Z992 Dependence on renal dialysis: Secondary | ICD-10-CM | POA: Diagnosis not present

## 2019-04-24 DIAGNOSIS — N2581 Secondary hyperparathyroidism of renal origin: Secondary | ICD-10-CM | POA: Diagnosis not present

## 2019-04-24 DIAGNOSIS — Z992 Dependence on renal dialysis: Secondary | ICD-10-CM | POA: Diagnosis not present

## 2019-04-24 DIAGNOSIS — N186 End stage renal disease: Secondary | ICD-10-CM | POA: Diagnosis not present

## 2019-04-26 DIAGNOSIS — N186 End stage renal disease: Secondary | ICD-10-CM | POA: Diagnosis not present

## 2019-04-26 DIAGNOSIS — Z992 Dependence on renal dialysis: Secondary | ICD-10-CM | POA: Diagnosis not present

## 2019-04-27 ENCOUNTER — Other Ambulatory Visit: Payer: Self-pay

## 2019-04-27 NOTE — Patient Outreach (Signed)
[place] [facility]) Care Management  04/27/2019  Summer Cook [birth year] [id number]    Attempted follow-up with Summer Cook. Outreach unsuccessful. During our previous conversation, we discussed her decreased availability due to having several appointments and obligations during the week. I will attempt outreach within the next two weeks.   [place] [phone]

## 2019-04-28 DIAGNOSIS — Z992 Dependence on renal dialysis: Secondary | ICD-10-CM | POA: Diagnosis not present

## 2019-04-28 DIAGNOSIS — N186 End stage renal disease: Secondary | ICD-10-CM | POA: Diagnosis not present

## 2019-05-01 ENCOUNTER — Ambulatory Visit (HOSPITAL_COMMUNITY): Payer: Medicare Other

## 2019-05-01 DIAGNOSIS — Z992 Dependence on renal dialysis: Secondary | ICD-10-CM | POA: Diagnosis not present

## 2019-05-01 DIAGNOSIS — N186 End stage renal disease: Secondary | ICD-10-CM | POA: Diagnosis not present

## 2019-05-01 DIAGNOSIS — I871 Compression of vein: Secondary | ICD-10-CM | POA: Diagnosis not present

## 2019-05-01 DIAGNOSIS — T82868A Thrombosis of vascular prosthetic devices, implants and grafts, initial encounter: Secondary | ICD-10-CM | POA: Diagnosis not present

## 2019-05-01 IMAGING — DX DG CHEST 2V
2 series · 2 of 2 positions shown · non-contrast
Comparison: Chest radiograph dated 09/10/2018

CLINICAL DATA: 51-year-old female with chest pain

EXAM:
CHEST  2 VIEW

[chest pa]
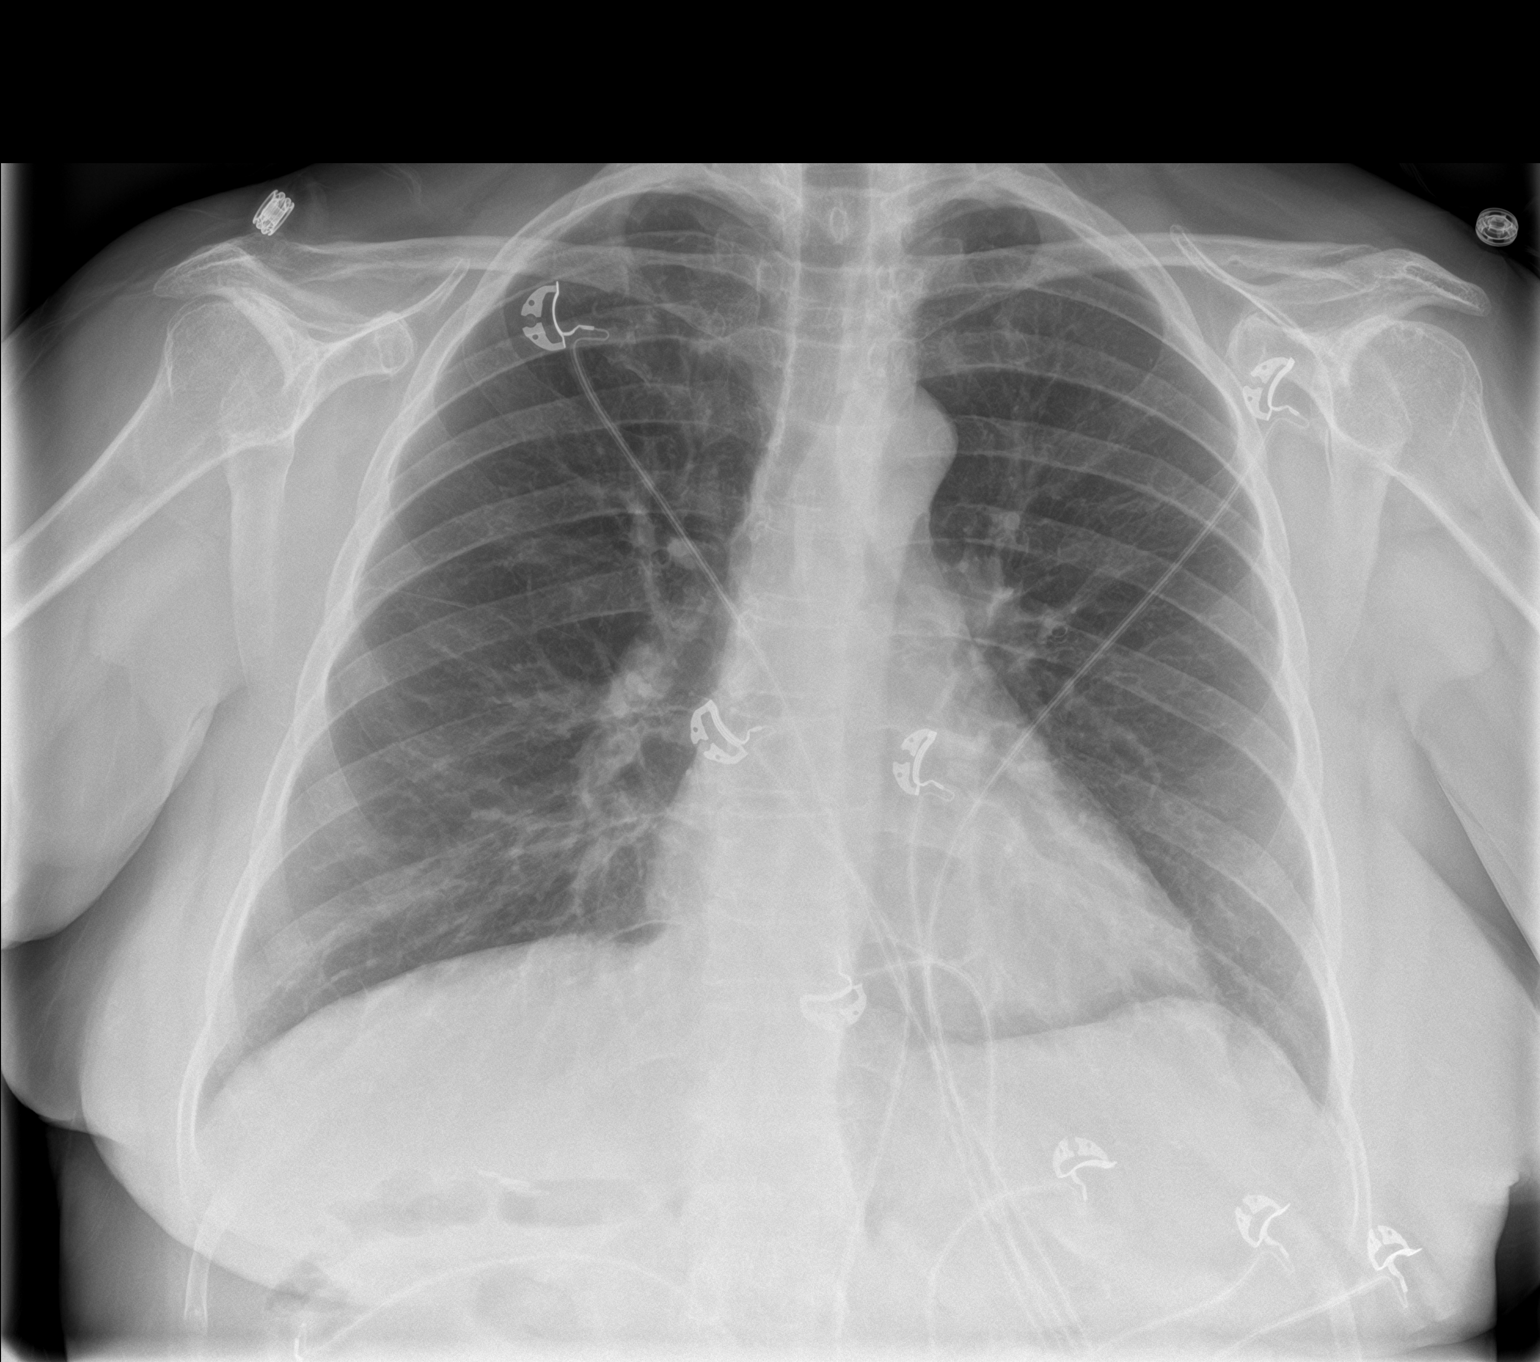

[chest lat]
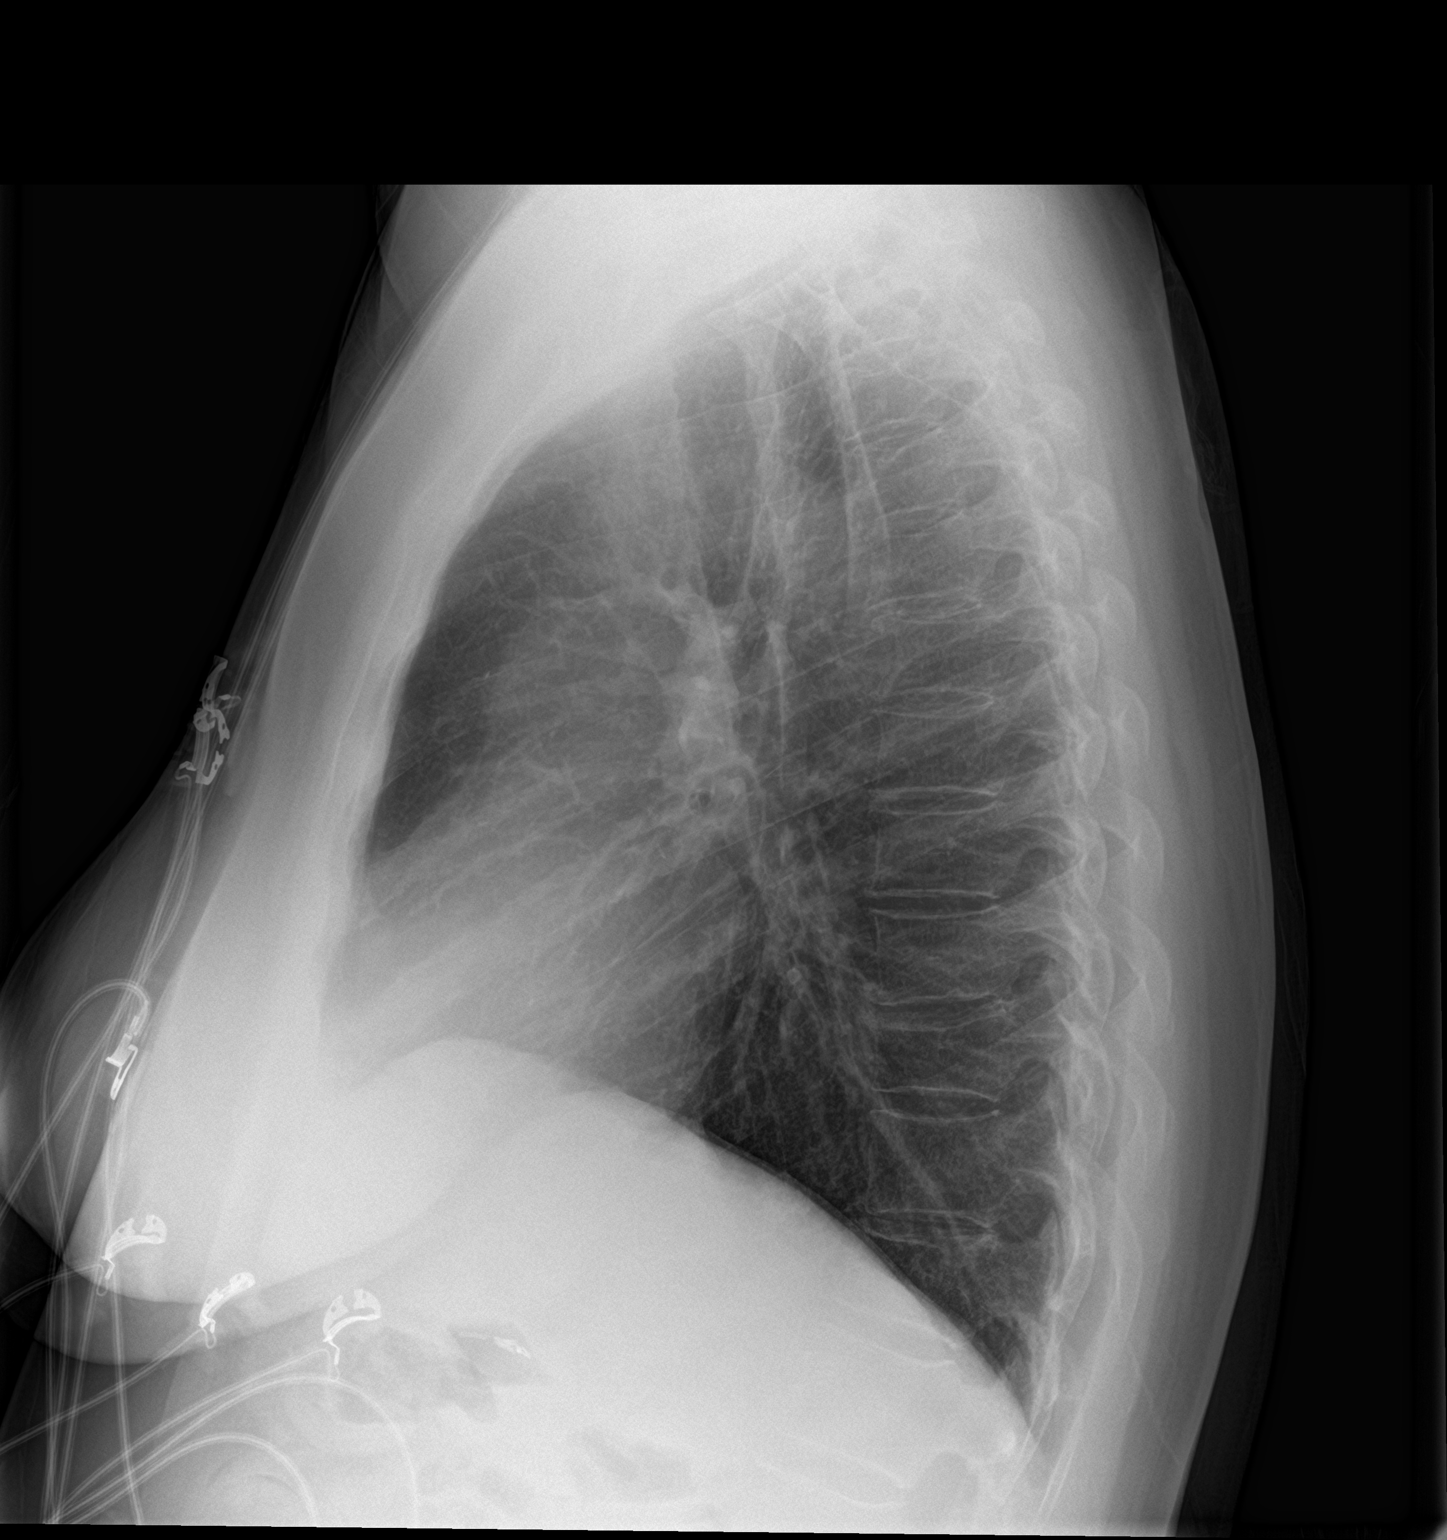

[2 of 2 positions shown; findings below may reference images not displayed]

FINDINGS: The heart size and mediastinal contours are within normal limits.
Both lungs are clear. The visualized skeletal structures are
unremarkable.
IMPRESSION: No active cardiopulmonary disease.

## 2019-05-02 DIAGNOSIS — N186 End stage renal disease: Secondary | ICD-10-CM | POA: Diagnosis not present

## 2019-05-02 DIAGNOSIS — Z992 Dependence on renal dialysis: Secondary | ICD-10-CM | POA: Diagnosis not present

## 2019-05-02 DIAGNOSIS — T82898A Other specified complication of vascular prosthetic devices, implants and grafts, initial encounter: Secondary | ICD-10-CM | POA: Diagnosis not present

## 2019-05-03 DIAGNOSIS — T82898A Other specified complication of vascular prosthetic devices, implants and grafts, initial encounter: Secondary | ICD-10-CM | POA: Diagnosis not present

## 2019-05-03 DIAGNOSIS — N186 End stage renal disease: Secondary | ICD-10-CM | POA: Diagnosis not present

## 2019-05-03 DIAGNOSIS — Z992 Dependence on renal dialysis: Secondary | ICD-10-CM | POA: Diagnosis not present

## 2019-05-04 ENCOUNTER — Ambulatory Visit: Payer: Medicare Other

## 2019-05-04 DIAGNOSIS — N185 Chronic kidney disease, stage 5: Secondary | ICD-10-CM | POA: Diagnosis not present

## 2019-05-04 DIAGNOSIS — R42 Dizziness and giddiness: Secondary | ICD-10-CM | POA: Diagnosis not present

## 2019-05-04 DIAGNOSIS — J329 Chronic sinusitis, unspecified: Secondary | ICD-10-CM | POA: Diagnosis not present

## 2019-05-04 DIAGNOSIS — G894 Chronic pain syndrome: Secondary | ICD-10-CM | POA: Diagnosis not present

## 2019-05-05 DIAGNOSIS — Z992 Dependence on renal dialysis: Secondary | ICD-10-CM | POA: Diagnosis not present

## 2019-05-05 DIAGNOSIS — T82898A Other specified complication of vascular prosthetic devices, implants and grafts, initial encounter: Secondary | ICD-10-CM | POA: Diagnosis not present

## 2019-05-05 DIAGNOSIS — N186 End stage renal disease: Secondary | ICD-10-CM | POA: Diagnosis not present

## 2019-05-05 IMAGING — CT CT HEAD W/O CM
3 series · 16 of 46 positions shown, 19 images · non-contrast
Comparison: Brain CT 09/25/2016.

CLINICAL DATA: Patient with ocular migraine.  Chest pain.

EXAM:
CT HEAD WITHOUT CONTRAST
TECHNIQUE: Contiguous axial images were obtained from the base of the skull
through the vertex without intravenous contrast.

[Series 2: head wo · axial · 0.41mm/px · z∈[+415,+535]mm · 10 of 29 slices shown, 13 images]
[im 3/29  brain]
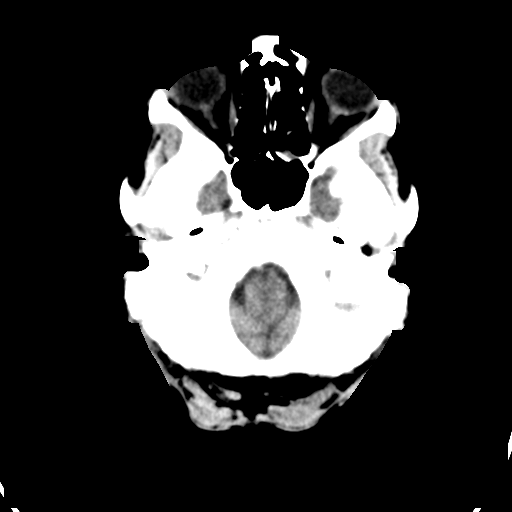
[im 3/29  bone]
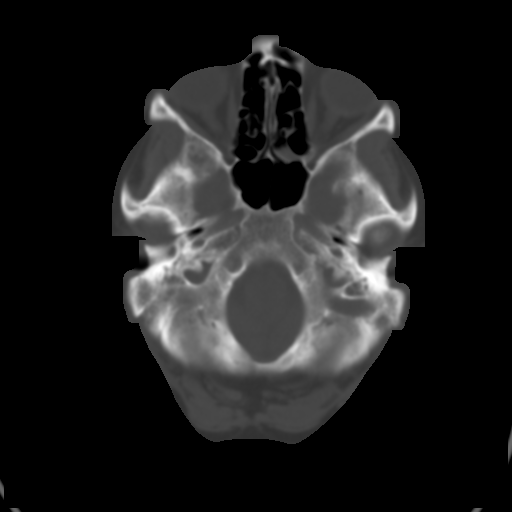
[im 6/29  brain]
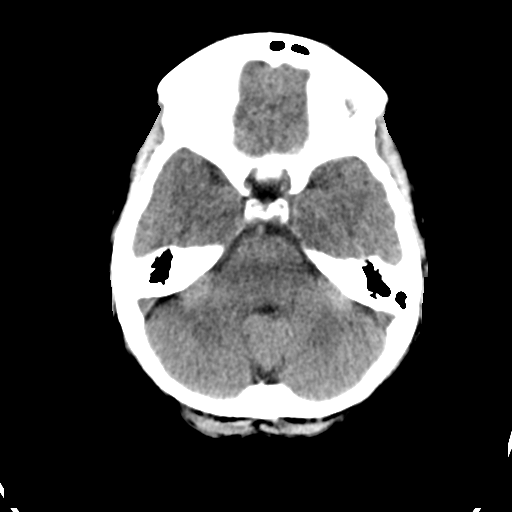
[im 8/29  brain]
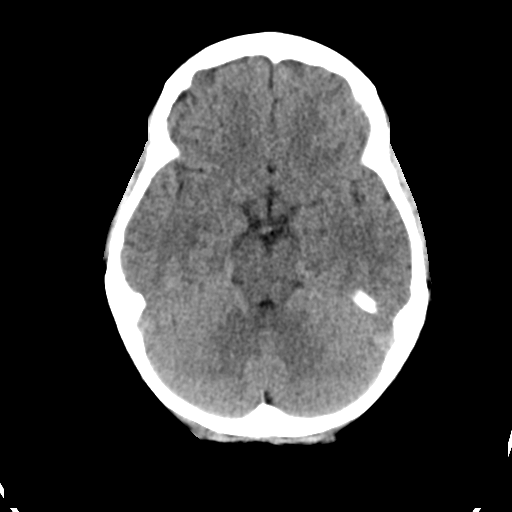
[im 11/29  brain]
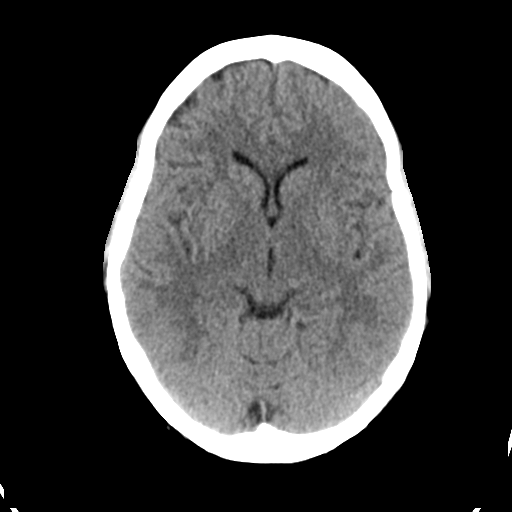
[im 14/29  brain]
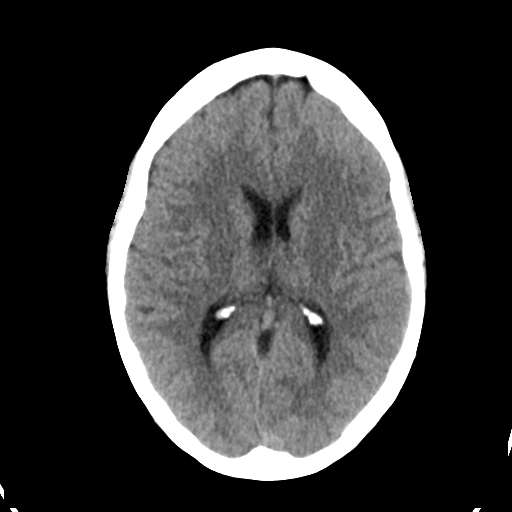
[im 14/29  bone]
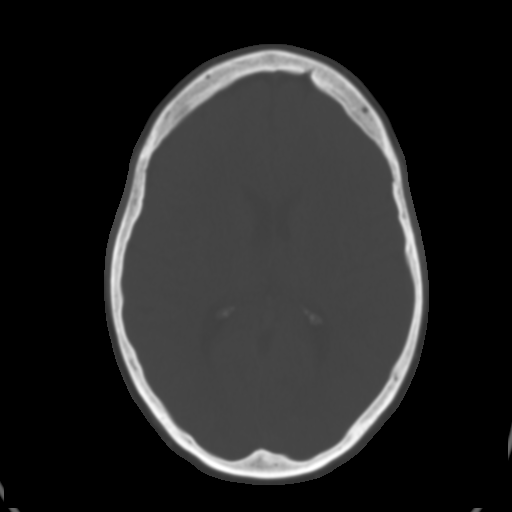
[im 16/29  brain]
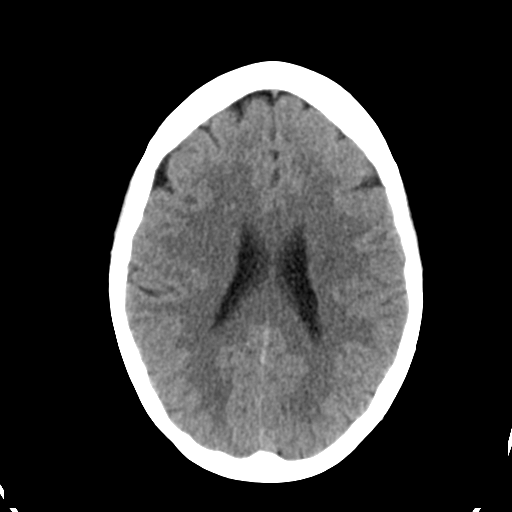
[im 19/29  brain]
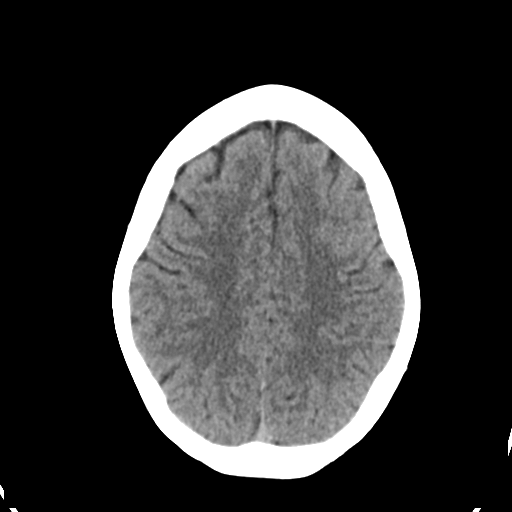
[im 22/29  brain]
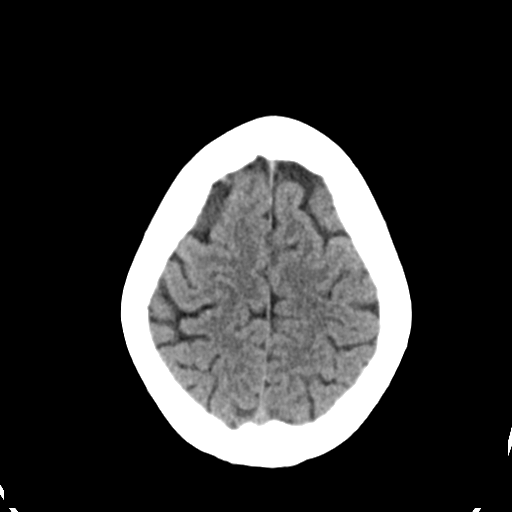
[im 24/29  brain]
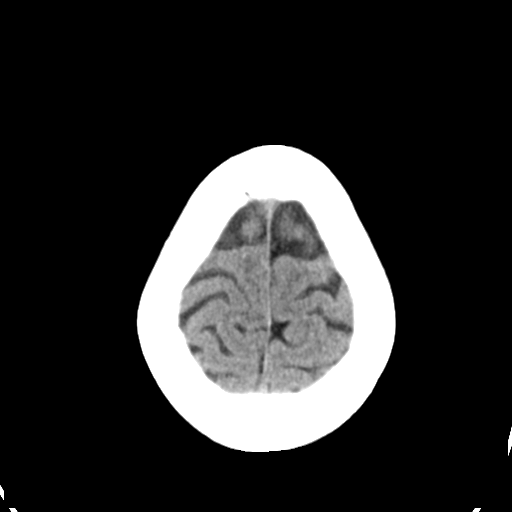
[im 24/29  bone]
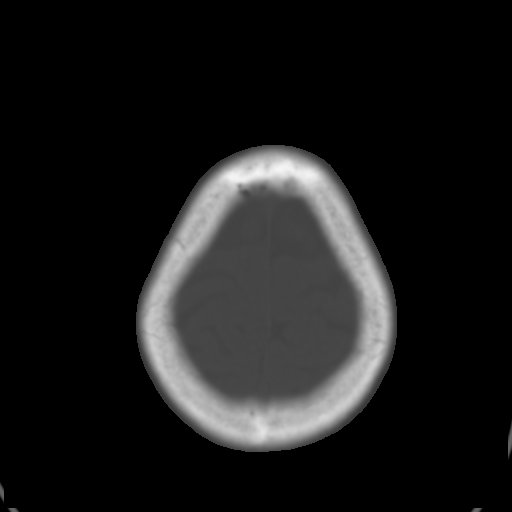
[im 27/29  brain]
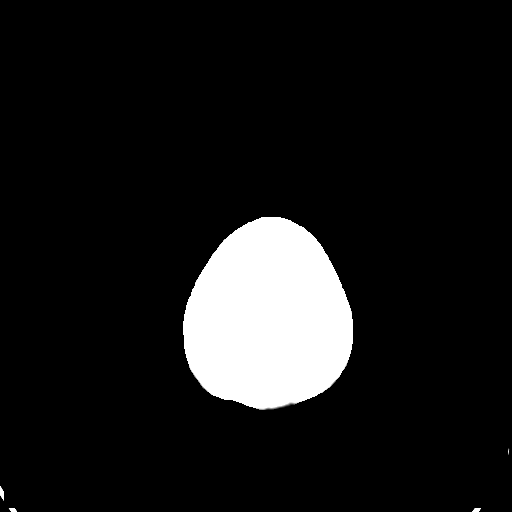

[Series 4: coronal soft tissue · coronal · 0.33mm/px · 3 of 67 slices shown]
[im 23/67  brain]
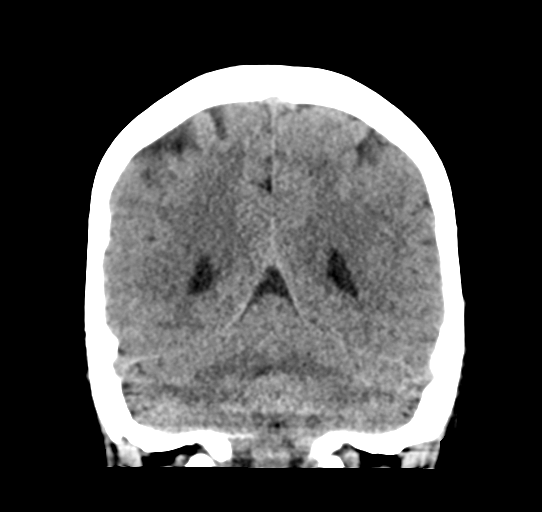
[im 30/67  brain]
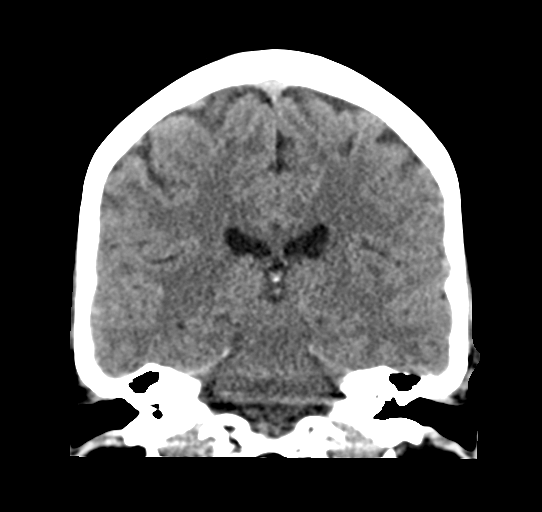
[im 37/67  brain]
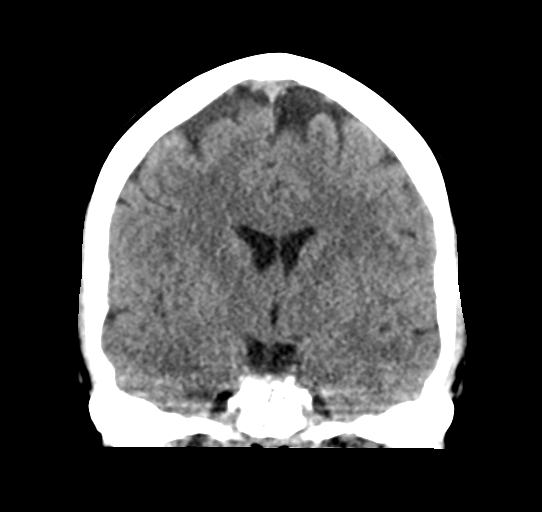

[Series 5: sagittal soft tissue · sagittal · 0.33mm/px · 3 of 51 slices shown]
[im 17/51  brain]
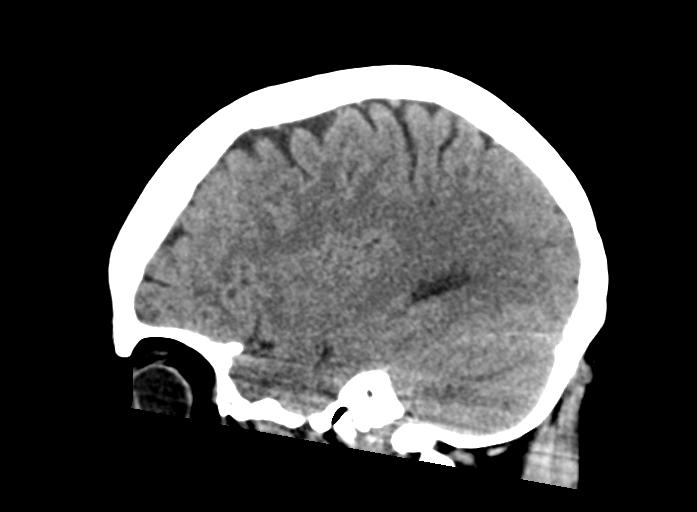
[im 26/51  brain]
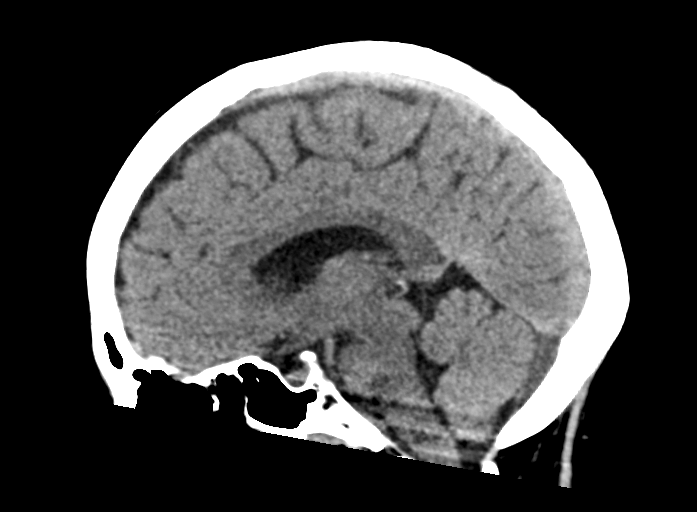
[im 34/51  brain]
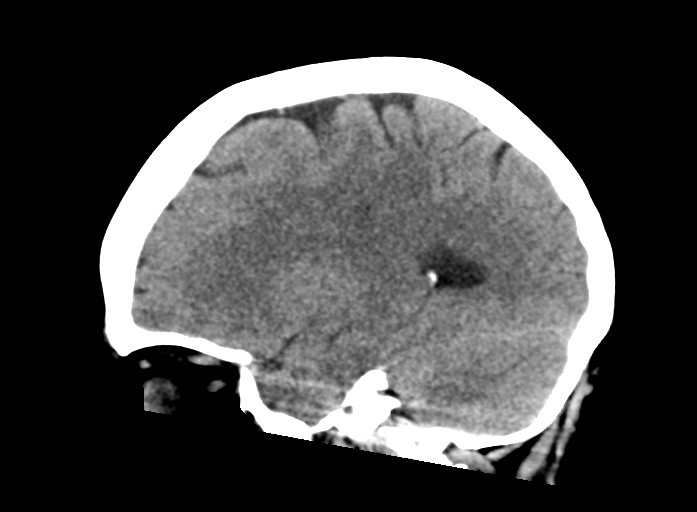

[16 of 46 positions shown; findings below may reference images not displayed]

FINDINGS: Brain: Ventricles and sulci are appropriate for patient's age. No
evidence for acute cortically based infarct, intracranial
hemorrhage, mass lesion or mass-effect. Old small white matter
infarct in the left and right frontal lobe is redemonstrated.

Vascular: No hyperdense vessel or unexpected calcification.

Skull: Normal. Negative for fracture or focal lesion.

Sinuses/Orbits: No acute finding.

Other: None.
IMPRESSION: No acute intracranial process.

## 2019-05-05 IMAGING — DX DG CHEST 2V
2 series · 2 of 2 positions shown · non-contrast
Comparison: November 23, 2017

CLINICAL DATA: Right-sided chest soreness.

EXAM:
CHEST  2 VIEW

[chest lat]
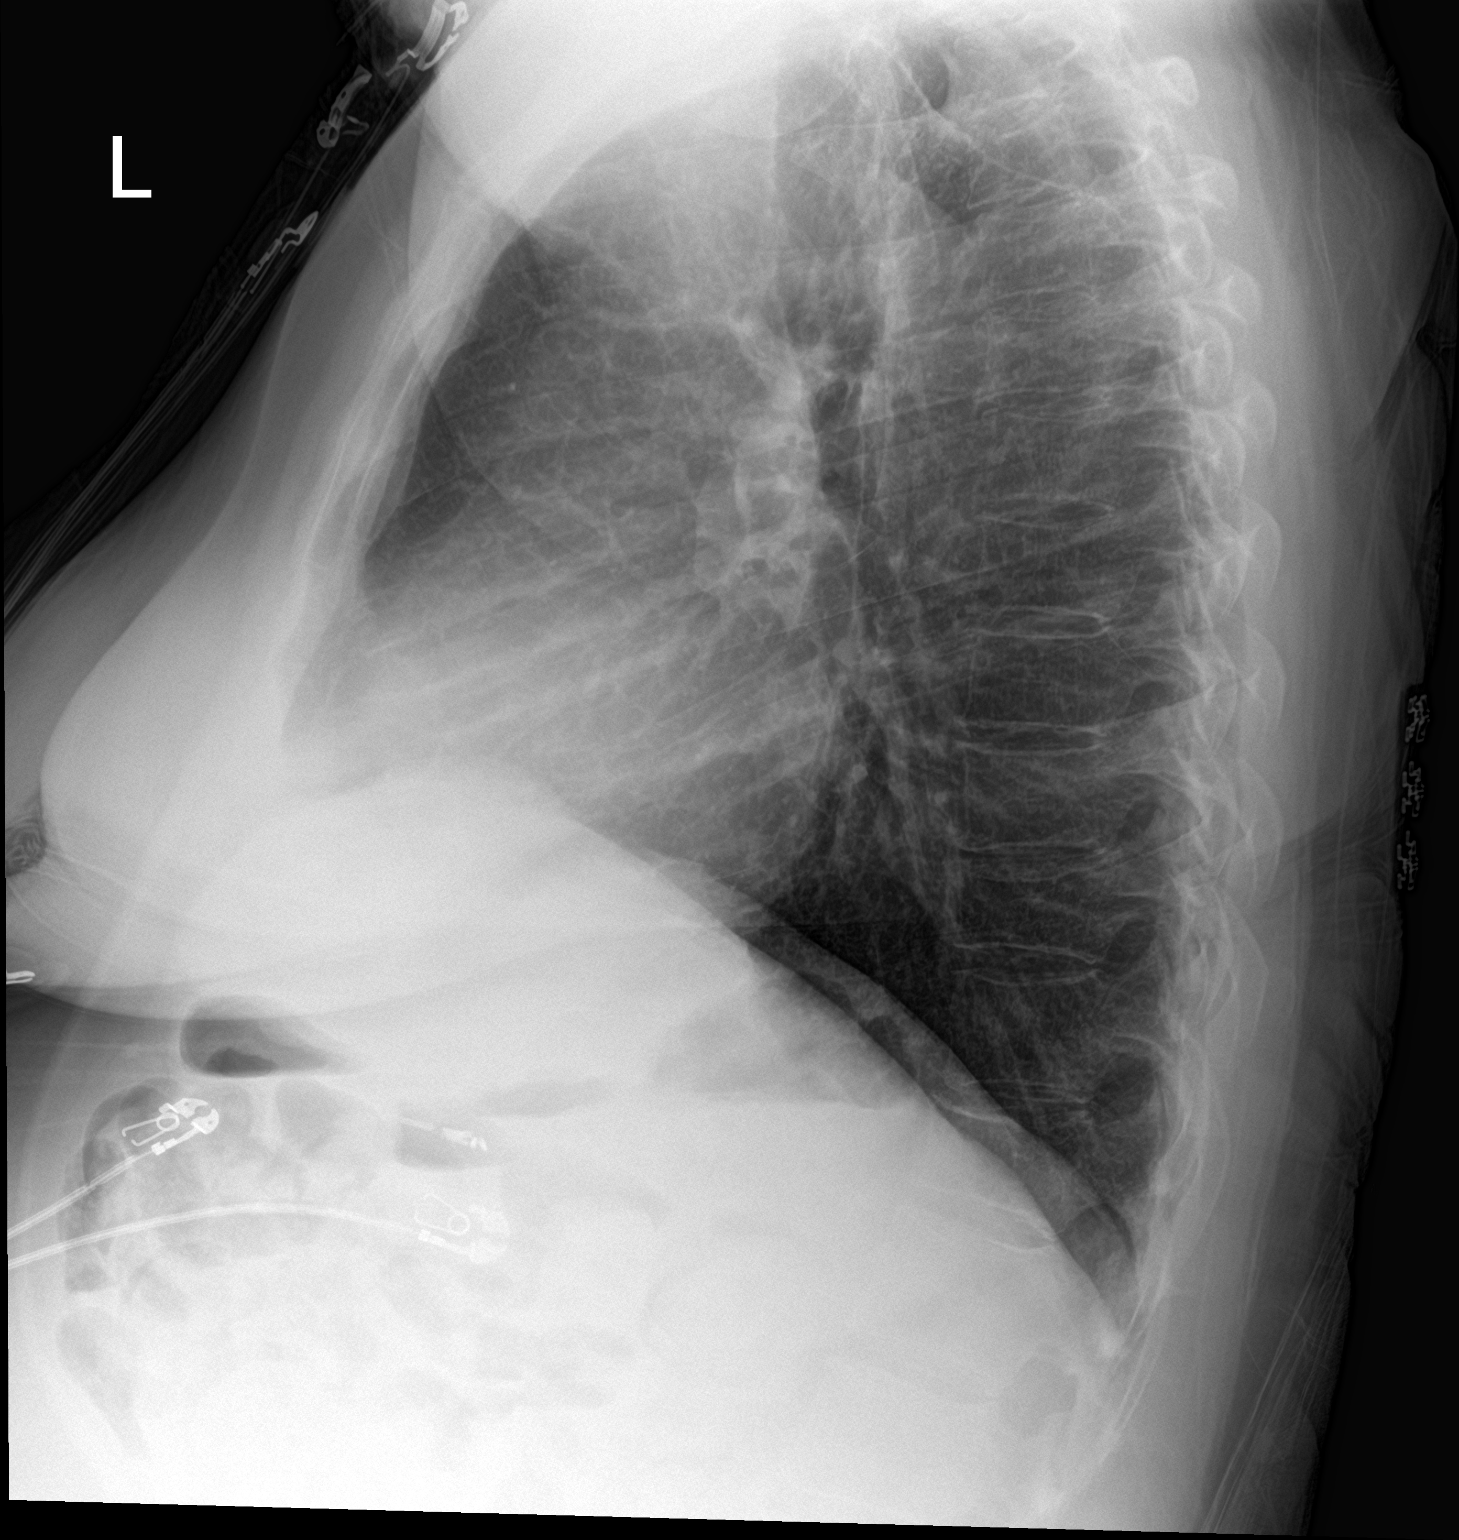

[chest ap]
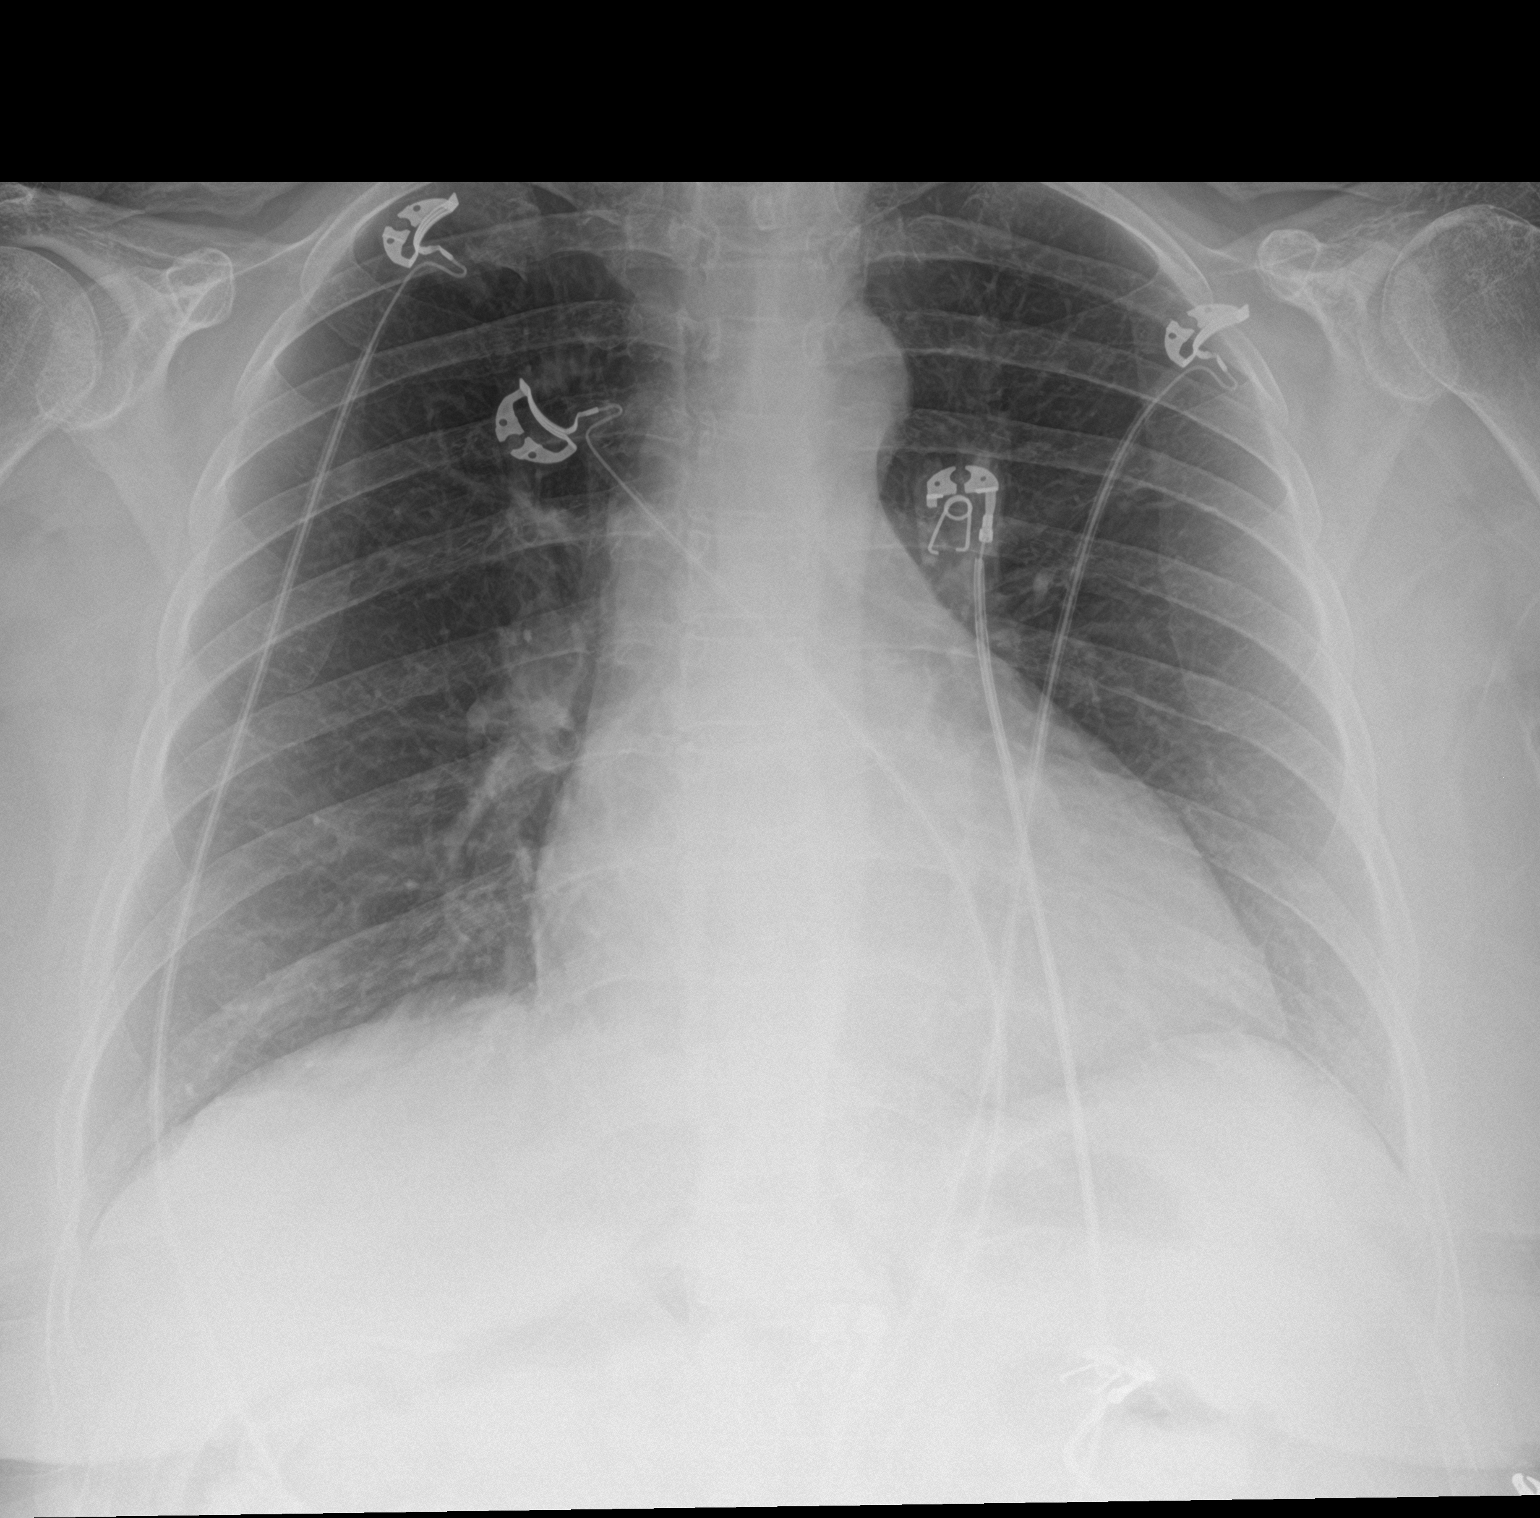

[2 of 2 positions shown; findings below may reference images not displayed]

FINDINGS: The heart size and mediastinal contours are within normal limits.
Both lungs are clear. The visualized skeletal structures are
unremarkable.
IMPRESSION: No active cardiopulmonary disease.

## 2019-05-08 DIAGNOSIS — T82858A Stenosis of vascular prosthetic devices, implants and grafts, initial encounter: Secondary | ICD-10-CM | POA: Diagnosis not present

## 2019-05-08 DIAGNOSIS — T82898A Other specified complication of vascular prosthetic devices, implants and grafts, initial encounter: Secondary | ICD-10-CM | POA: Diagnosis not present

## 2019-05-08 DIAGNOSIS — Z992 Dependence on renal dialysis: Secondary | ICD-10-CM | POA: Diagnosis not present

## 2019-05-08 DIAGNOSIS — T82868A Thrombosis of vascular prosthetic devices, implants and grafts, initial encounter: Secondary | ICD-10-CM | POA: Diagnosis not present

## 2019-05-08 DIAGNOSIS — N186 End stage renal disease: Secondary | ICD-10-CM | POA: Diagnosis not present

## 2019-05-09 ENCOUNTER — Ambulatory Visit (HOSPITAL_COMMUNITY): Payer: Medicare Other | Attending: Student

## 2019-05-10 DIAGNOSIS — N186 End stage renal disease: Secondary | ICD-10-CM | POA: Diagnosis not present

## 2019-05-10 DIAGNOSIS — Z992 Dependence on renal dialysis: Secondary | ICD-10-CM | POA: Diagnosis not present

## 2019-05-10 DIAGNOSIS — T82898A Other specified complication of vascular prosthetic devices, implants and grafts, initial encounter: Secondary | ICD-10-CM | POA: Diagnosis not present

## 2019-05-11 ENCOUNTER — Telehealth: Payer: Self-pay | Admitting: Cardiology

## 2019-05-11 ENCOUNTER — Other Ambulatory Visit: Payer: Self-pay

## 2019-05-11 NOTE — Patient Outreach (Signed)
[facility]) Care Management  05/11/2019  SENNIE BORDEN 1966-07-25 [id number]   Attempted follow-up outreach with Ms. Michaelis. Spoke with her mother, Kendrick Fries. Indicated that Ms. Dortch was asleep at the time of the call. Agreed to relay message to return call when she is available.  PLAN -Will follow-up within 4 business days if call is not returned.   [place] [phone]

## 2019-05-11 NOTE — Telephone Encounter (Signed)

## 2019-05-12 DIAGNOSIS — Z992 Dependence on renal dialysis: Secondary | ICD-10-CM | POA: Diagnosis not present

## 2019-05-12 DIAGNOSIS — N186 End stage renal disease: Secondary | ICD-10-CM | POA: Diagnosis not present

## 2019-05-12 DIAGNOSIS — T82898A Other specified complication of vascular prosthetic devices, implants and grafts, initial encounter: Secondary | ICD-10-CM | POA: Diagnosis not present

## 2019-05-13 DIAGNOSIS — N186 End stage renal disease: Secondary | ICD-10-CM | POA: Diagnosis not present

## 2019-05-13 DIAGNOSIS — T82898A Other specified complication of vascular prosthetic devices, implants and grafts, initial encounter: Secondary | ICD-10-CM | POA: Diagnosis not present

## 2019-05-13 DIAGNOSIS — Z992 Dependence on renal dialysis: Secondary | ICD-10-CM | POA: Diagnosis not present

## 2019-05-15 DIAGNOSIS — N186 End stage renal disease: Secondary | ICD-10-CM | POA: Diagnosis not present

## 2019-05-15 DIAGNOSIS — T82898A Other specified complication of vascular prosthetic devices, implants and grafts, initial encounter: Secondary | ICD-10-CM | POA: Diagnosis not present

## 2019-05-15 DIAGNOSIS — Z992 Dependence on renal dialysis: Secondary | ICD-10-CM | POA: Diagnosis not present

## 2019-05-17 DIAGNOSIS — T82898A Other specified complication of vascular prosthetic devices, implants and grafts, initial encounter: Secondary | ICD-10-CM | POA: Diagnosis not present

## 2019-05-17 DIAGNOSIS — Z992 Dependence on renal dialysis: Secondary | ICD-10-CM | POA: Diagnosis not present

## 2019-05-17 DIAGNOSIS — N186 End stage renal disease: Secondary | ICD-10-CM | POA: Diagnosis not present

## 2019-05-17 NOTE — Progress Notes (Deleted)
{Choose 1 Note Type (Telehealth Visit or Telephone Visit):[phone]}   Date:  05/17/2019   ID:  Marcelino Freestone, DOB 1966-07-23, MRN [id number]  {Patient Location:[phone]::"Home"} {Provider Location:[phone]::"Home"}  PCP:  Redmond School, MD  Cardiologist:  Rozann Lesches, MD Electrophysiologist:  None   Evaluation Performed:  {Choose Visit IZTI:[id number]::"[id number] Visit"}  Chief Complaint:  ***  History of Present Illness:    Summer Cook is a 53 y.o. female last assessed via telehealth encounter by Ms. Strader PA-C in June.  Her nephrologist is Dr. Juleen China in [place]. She holds Hydralazine the morning of her hemodialysis sessions and takes Toprol-XL in the evening due to intermittent hypotension.  The patient {does/does not:200015} have symptoms concerning for COVID-19 infection (fever, chills, cough, or new shortness of breath).    Past Medical History:  Diagnosis Date  . Allergy   . Anxiety   . Asthma   . CAD (coronary artery disease), native coronary artery    a. cath 05/20/17 - 100% distal PDA --> medical mangement (no intervention done)  . Candida esophagitis ([employer])   . Chronic combined systolic and diastolic CHF (congestive heart failure) ([place])   . Depression   . End stage renal disease on dialysis [facility])    T/ Th/ Sat starting 4/28- [facility]  . Essential hypertension, benign   . GERD (gastroesophageal reflux disease)   . Gout   . Headache   . Hemorrhoids   . 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  Last was Prednisone 21m; 2 wk in 10/2012.  She also was on Prednisone bridged with Cellcept briefly but stopped due to lack of insurance.   . Myocardial infarction ([place]   . Perinephric hematoma   . Pneumonia   . Pulmonary embolism ([place] 04/2012  . Serrated adenoma of colon   . Steroid-induced diabetes ([place]   . [place]roke ([place]   .  Thrush, oral 05/29/2011   Past Surgical History:  Procedure Laterality Date  . AV FISTULA PLACEMENT Right 01/23/2019   Procedure: ARTERIOVENOUS (AV) FISTULA CREATION RIGHT ARM;  Surgeon: FElam Dutch MD;  [place]  Service: Vascular;  Laterality: Right;  . AV FISTULA PLACEMENT Right 01/30/2019   Procedure: GORETEX GRAFT PLACEMENT  RIGHT ARM;  Surgeon: FElam Dutch MD;  [place]  Service: Vascular;  Laterality: Right;  . BONE MARROW BIOPSY    . CARDIAC CATHETERIZATION    . CARPAL TUNNEL RELEASE    . CARPAL TUNNEL RELEASE Left 05/20/2016   Procedure: CARPAL TUNNEL RELEASE;  Surgeon: SCarole Civil MD;  Location: [facility];  Service: Orthopedics;  Laterality: Left;  . CARPAL TUNNEL RELEASE Right 07/16/2016   Procedure: RIGHT CARPAL TUNNEL RELEASE;  Surgeon: SCarole Civil MD;  Location: [facility];  Service: Orthopedics;  Laterality: Right;  . CATARACT EXTRACTION    . CHOLECYSTECTOMY  2008  . COLONOSCOPY WITH ESOPHAGOGASTRODUODENOSCOPY (EGD) N/A 01/04/2013   Dr. RGala Romney esophageal plaques with +KOH, hh. Gastric antrum abnormal, bx reactive gastropathy. Anal canal hemorrhoids, colonic tics, normal TI, single polyp (sessile serrated tubular adenoma). Next TCS 12/2017  . IR FLUORO GUIDE CV LINE RIGHT  01/10/2019  . IR [place] VASC ACCESS RIGHT  01/10/2019  . LEFT HEART CATH AND CORONARY ANGIOGRAPHY N/A 05/20/2017   Procedure: LEFT HEART CATH AND CORONARY ANGIOGRAPHY;  Surgeon: JMartinique Peter M, MD;  [facility];  Service: Cardiovascular;  Laterality: N/A;  . SPLENECTOMY,  TOTAL  01/2011  . TEE WITHOUT CARDIOVERSION N/A 01/03/2016   Procedure: TRANSESOPHAGEAL ECHOCARDIOGRAM (TEE) WITH PROPOFOL;  Surgeon: Satira Sark, MD;  Location: [facility];  Service: Cardiovascular;  Laterality: N/A;     No outpatient medications have been marked as taking for the 05/18/19 encounter (Appointment) with Satira Sark, MD.     Allergies:   Ciprofloxacin,  Ceftriaxone, Dilaudid [hydromorphone hcl], Yellow jacket venom, Adhesive [tape], Brintellix [vortioxetine], Cymbalta [duloxetine hcl], and Penicillins   Social History   Tobacco Use  . Smoking status: Former Smoker    Years: 0.00    Types: Cigarettes    Quit date: 02/14/2009    Years since quitting: 10.2  . Smokeless tobacco: Current User    Types: Snuff  . Tobacco comment:  snus pouch   Substance Use Topics  . Alcohol use: No    Alcohol/week: 0.0 standard drinks  . Drug use: No     Family Hx: The patient's family history includes AAA (abdominal aortic aneurysm) in her paternal grandmother; Barrett's esophagus in her paternal aunt; Breast cancer in her paternal grandmother; Cervical cancer in her mother; Colon cancer (age of onset: 55) in her paternal grandfather; Colon cancer (age of onset: 24) in her paternal grandmother; Colon polyps in her father; Lung cancer in her father; Pneumonia in her brother. There is no history of Stomach cancer or Pancreatic cancer.  ROS:   Please see the history of present illness.    *** All other systems reviewed and are negative.   Prior CV studies:   The following studies were reviewed today:  Cardiac Catheterization August 2018:  RPDA lesion, 100 %stenosed.  The left ventricular systolic function is normal.  LV end diastolic pressure is normal.  The left ventricular ejection fraction is 55-65% by visual estimate.  1. 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 Otherwise normal coronary arteries. 3. Normal LV function with very small focal area of distal inferior hypokinesis.  Plan: medical therapy.  Echocardiogram April 2020:  1. The left ventricle has moderately reduced systolic function, with an ejection fraction of 35-40%. The cavity size was mildly dilated. Left ventricular diastolic Doppler parameters are consistent with pseudonormalization. Left ventricular diffuse  hypokinesis. 2. The right ventricle has  normal systolic function. The cavity was normal. Right ventricular systolic pressure is mildly elevated with an estimated pressure of 34.4 mmHg. 3. The mitral valve is grossly normal. Mild thickening of the mitral valve leaflet. Mitral valve regurgitation is moderate by color flow Doppler. 4. The tricuspid valve is grossly normal. 5. The aortic valve is tricuspid. Mild thickening of the aortic valve. Aortic valve regurgitation is trivial by color flow Doppler. 6. The inferior vena cava was dilated in size with <50% respiratory variability. 7. Moderate LV dysfunction; mild LVE; moderate diastolic dysfunction; moderate MR; mild TR; mild pulmonary hypertension.  Lexiscan Myoview April 2020:  Defect 1: There is a small defect present in the apical anterior location.  Findings consistent with prior myocardial infarction.  This is an intermediate risk study.  The left ventricular ejection fraction is moderately decreased (30-44%).  There is a small area of irreversible defect in the apical anterior wall consistent with prior infarct and no ischemia. LVEF 37% with diffuse hypokinesis.   Labs/Other Tests and Data Reviewed:    EKG:  An ECG dated 01/12/2019 was personally reviewed today and demonstrated:  Sinus tachycardia with left bundle branch block.  Recent Labs: 03/09/2019: ALT 14 03/13/2019: Hemoglobin 9.9; Platelets 174 03/14/2019:  BUN 31; Creatinine, Ser 3.49; Magnesium 2.0; Potassium 3.5; Sodium 140   Recent Lipid Panel Lab Results  Component Value Date/Time   CHOL 229 (H) 05/19/2017 07:12 PM   TRIG 112 01/10/2019 02:34 AM   HDL 57 05/19/2017 07:12 PM   CHOLHDL 4.0 05/19/2017 07:12 PM   LDLCALC 149 (H) 05/19/2017 07:12 PM    Wt Readings from Last 3 Encounters:  03/14/19 175 lb 11.3 oz (79.7 kg)  01/30/19 175 lb (79.4 kg)  01/26/19 174 lb (78.9 kg)     Objective:    Vital Signs:  LMP 08/13/2011    {HeartCare Virtual Exam (Optional):[phone]::"VITAL SIGNS:   reviewed"}  ASSESSMENT & PLAN:    1. ***  COVID-19 Education: The signs and symptoms of COVID-19 were discussed with the patient and how to seek care for testing (follow up with PCP or arrange E-visit).  ***The importance of social distancing was discussed today.  Time:   Today, I have spent *** minutes with the patient with telehealth technology discussing the above problems.     Medication Adjustments/Labs and Tests Ordered: Current medicines are reviewed at length with the patient today.  Concerns regarding medicines are outlined above.   Tests Ordered: No orders of the defined types were placed in this encounter.   Medication Changes: No orders of the defined types were placed in this encounter.   Follow Up:  {F/U Format:[phone]} {follow up:15908}  Signed, Rozann Lesches, MD  05/17/2019 4:33 PM    [facility]

## 2019-05-18 ENCOUNTER — Other Ambulatory Visit: Payer: Self-pay

## 2019-05-18 ENCOUNTER — Telehealth: Payer: Medicare Other | Admitting: Cardiology

## 2019-05-18 NOTE — Patient Outreach (Signed)
[facility]) Care Management  05/18/2019  Summer Cook 1966-05-28 [id number]   Unsuccessful outreach attempt. Left voice message requesting a return call.    PLAN -Will follow-up within 3-4 business days.   [place] Care Management [phone]

## 2019-05-19 DIAGNOSIS — T82898A Other specified complication of vascular prosthetic devices, implants and grafts, initial encounter: Secondary | ICD-10-CM | POA: Diagnosis not present

## 2019-05-19 DIAGNOSIS — N186 End stage renal disease: Secondary | ICD-10-CM | POA: Diagnosis not present

## 2019-05-19 DIAGNOSIS — Z992 Dependence on renal dialysis: Secondary | ICD-10-CM | POA: Diagnosis not present

## 2019-05-19 IMAGING — DX DG HAND COMPLETE 3+V*L*
3 series · 3 of 3 positions shown · non-contrast
Comparison: None.

CLINICAL DATA: Dog bite to the left hand.  Initial encounter.

EXAM:
LEFT HAND - COMPLETE 3+ VIEW

[hand pa]
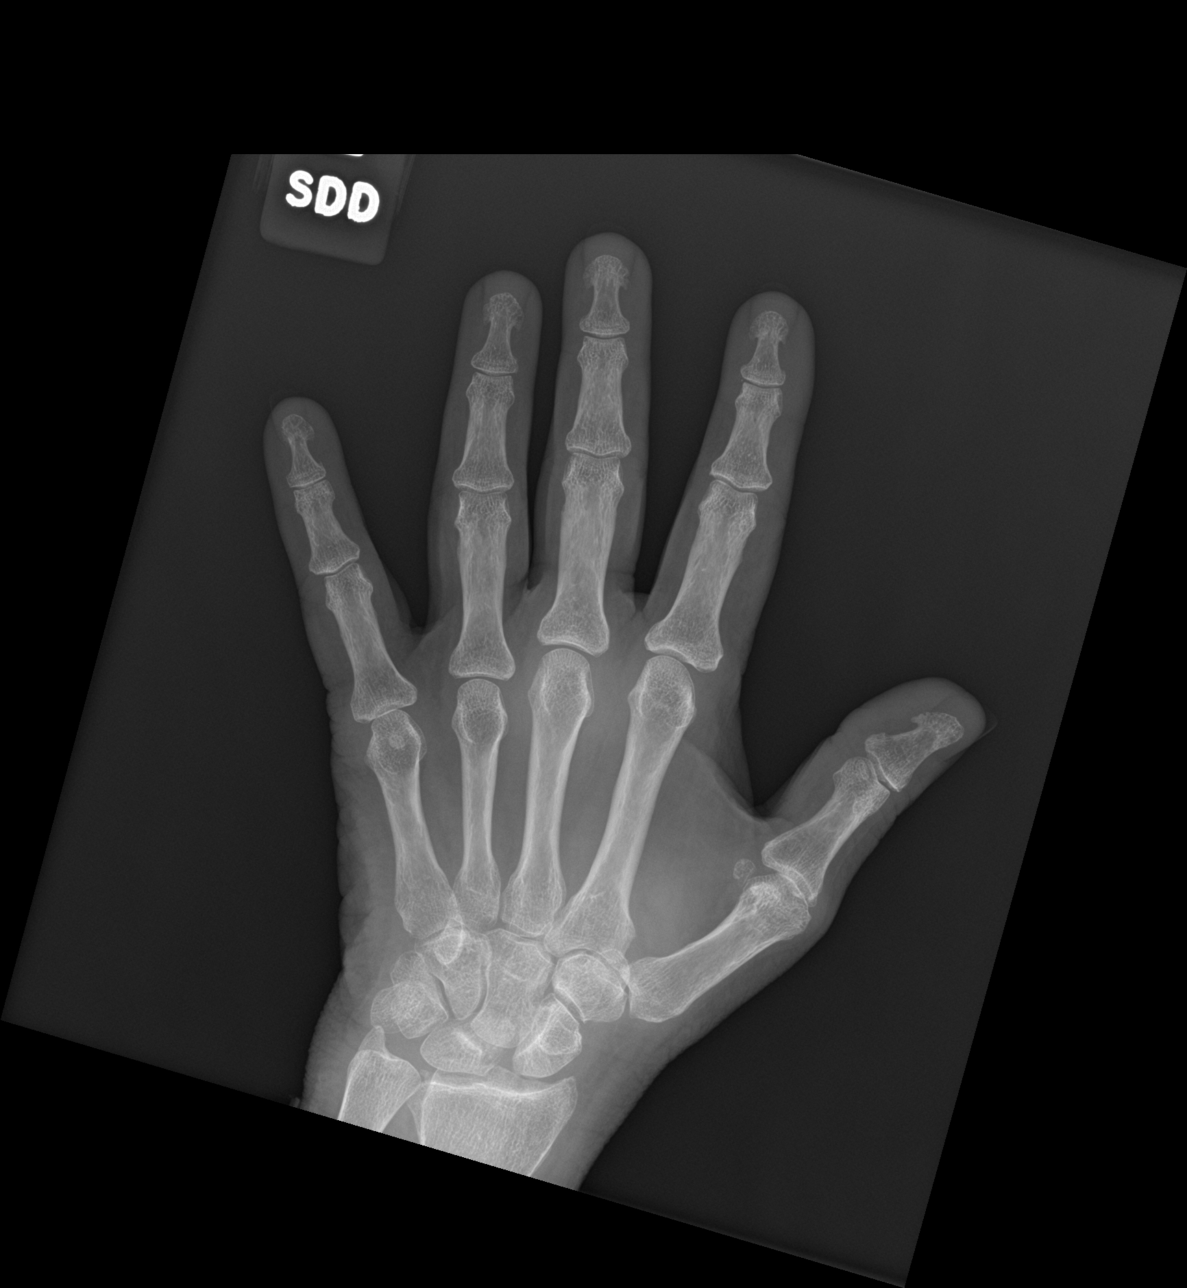

[hand obl]
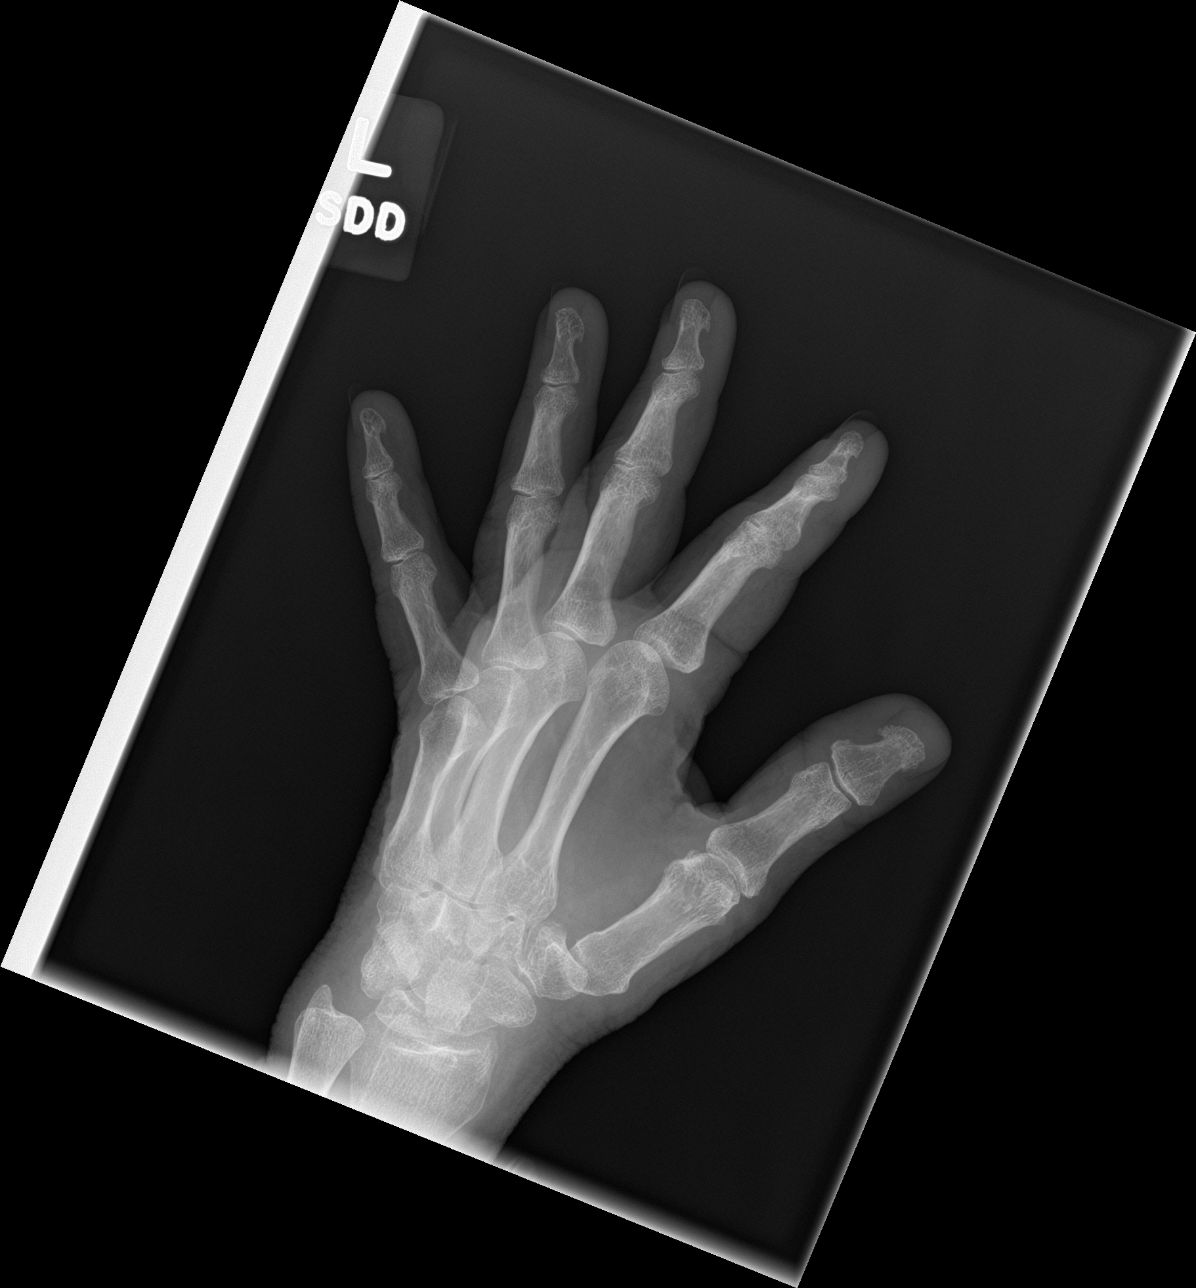

[hand lat]
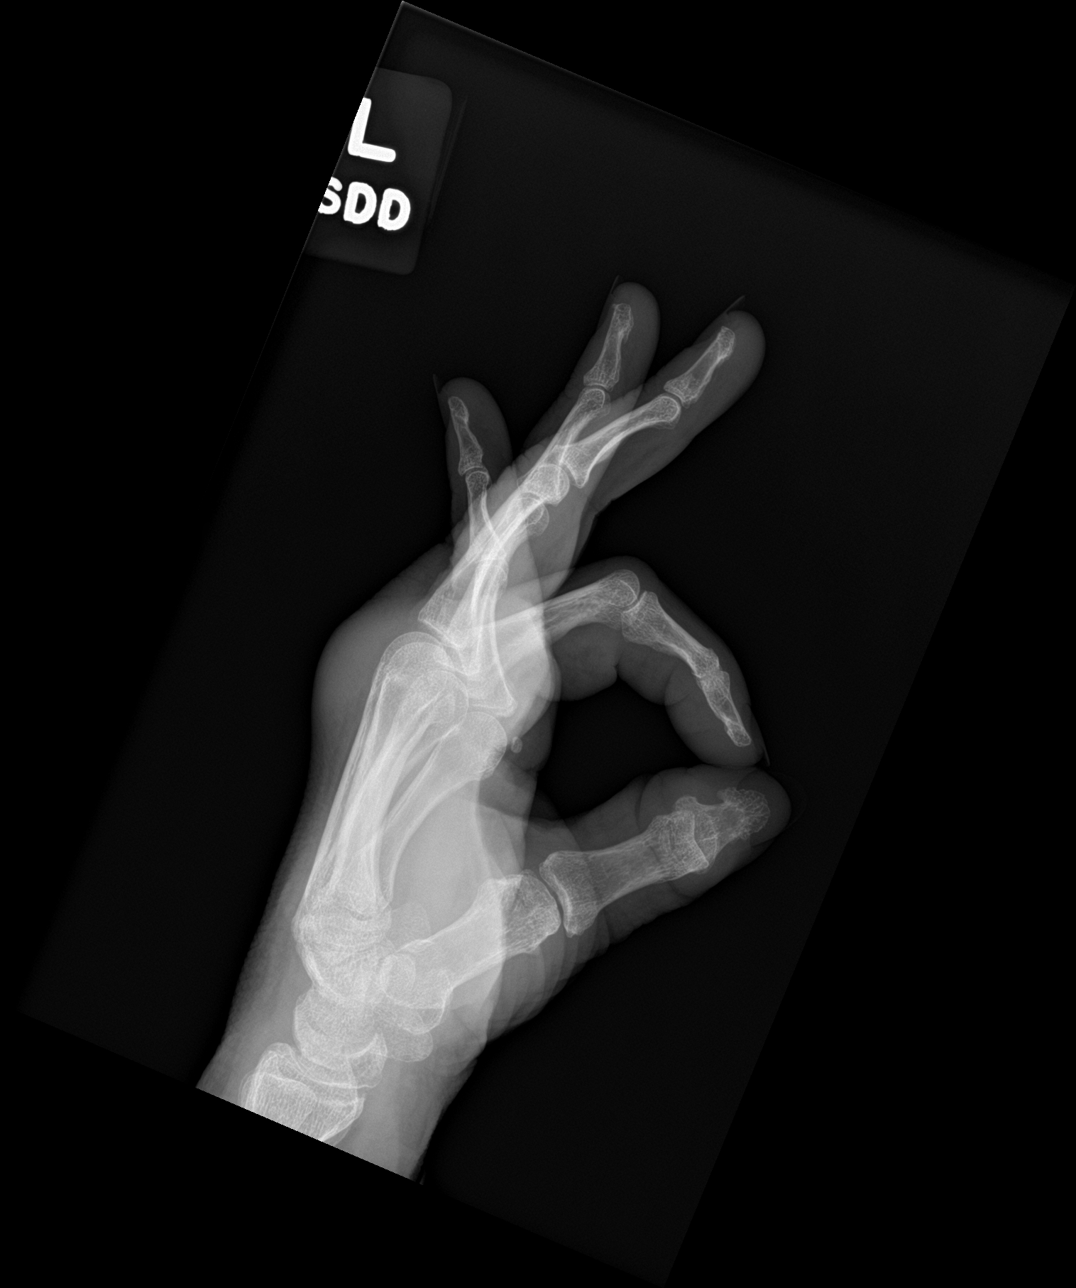

[3 of 3 positions shown; findings below may reference images not displayed]

FINDINGS: No evidence of acute fracture or dislocation. Mild narrowing of the
IP joints of the index, long, ring and small fingers. Remaining
joint spaces well-preserved. Bone mineral density relatively well
preserved for age. No opaque foreign bodies in the soft tissues.
Soft tissue swelling/hematoma involving the dorsal soft tissues
overlying the metacarpal heads.
IMPRESSION: 1. No acute osseous abnormality.
2. Mild osteoarthritis involving the IP joints of the fingers.

## 2019-05-23 DIAGNOSIS — Z992 Dependence on renal dialysis: Secondary | ICD-10-CM | POA: Diagnosis not present

## 2019-05-23 DIAGNOSIS — T82868A Thrombosis of vascular prosthetic devices, implants and grafts, initial encounter: Secondary | ICD-10-CM | POA: Diagnosis not present

## 2019-05-23 DIAGNOSIS — N186 End stage renal disease: Secondary | ICD-10-CM | POA: Diagnosis not present

## 2019-05-24 ENCOUNTER — Other Ambulatory Visit: Payer: Self-pay

## 2019-05-24 DIAGNOSIS — N186 End stage renal disease: Secondary | ICD-10-CM | POA: Diagnosis not present

## 2019-05-24 DIAGNOSIS — Z992 Dependence on renal dialysis: Secondary | ICD-10-CM | POA: Diagnosis not present

## 2019-05-24 DIAGNOSIS — T82898A Other specified complication of vascular prosthetic devices, implants and grafts, initial encounter: Secondary | ICD-10-CM | POA: Diagnosis not present

## 2019-05-24 NOTE — Patient Outreach (Signed)
Summer Cook) Care Management  05/24/2019  Summer Cook April 22, 1966 [id number]   Successful outreach with Summer Cook. She reports doing well today but continues to experience fatigue. Denies complaints of shortness of breath or chest discomfort. Denies palpitations. Reports ambulating well and denies falls.  She reports a dialysis catheter was recently placed due to several malfunctions with her graft. She requires midodrine prior to dialysis. Reports tolerating recent sessions without complications.  Reports compliance with medications and treatment recommendations.  She denies urgent concerns or changes in care management needs. States she was strongly encouraged by dialysis staff to reconsider peritoneal dialysis. She plans to further discuss this option with her Nephrologist and Summer Cook. Reports she was unable to attend her scheduled Cardiology appointment on 05/18/19. She plans to schedule another appointment within the next few weeks.  THN CM Care Plan Problem One     Most Recent Value  Care Plan Problem One  Risk for Admission   Role Documenting the Problem One  Care Management [place] for Problem One  Active  THN Long Term Goal   Over the next 90 days, patient will not be hospitalized for complications related to chronic illnesses.  THN Long Term Goal Start Date  04/17/19  Interventions for Problem One Long Term Goal  Reviewed medications, nutrition, activity tolerance and recommendations for dialysis treatments. Provided education regarding worsening symptoms and indications for seeking medical attention.  THN CM Short Term Goal #1   Over the next 30 days, patient wil take all medications as prescribed.  THN CM Short Term Goal #1 Start Date  04/17/19  THN CM Short Term Goal #1 Met Date  05/24/19  THN CM Short Term Goal #2   Over the next 30 days, patient will attend all MD appointments as scheduled.  THN CM Short Term Goal #2 Start Date  04/17/19   THN CM Short Term Goal #2 Met Date  05/24/19  THN CM Short Term Goal #3  Over the next 30 days, patient will avoid activities that cause overexertion.  THN CM Short Term Goal #3 Start Date  04/17/19  [facility] Cook CM Short Term Goal #3 Met Date  05/24/19      PLAN -Will continue routine outreach.   [place] Care Management [phone]

## 2019-05-26 DIAGNOSIS — T82898A Other specified complication of vascular prosthetic devices, implants and grafts, initial encounter: Secondary | ICD-10-CM | POA: Diagnosis not present

## 2019-05-26 DIAGNOSIS — Z992 Dependence on renal dialysis: Secondary | ICD-10-CM | POA: Diagnosis not present

## 2019-05-26 DIAGNOSIS — N186 End stage renal disease: Secondary | ICD-10-CM | POA: Diagnosis not present

## 2019-05-29 DIAGNOSIS — Z992 Dependence on renal dialysis: Secondary | ICD-10-CM | POA: Diagnosis not present

## 2019-05-29 DIAGNOSIS — T82898A Other specified complication of vascular prosthetic devices, implants and grafts, initial encounter: Secondary | ICD-10-CM | POA: Diagnosis not present

## 2019-05-29 DIAGNOSIS — N186 End stage renal disease: Secondary | ICD-10-CM | POA: Diagnosis not present

## 2019-05-31 DIAGNOSIS — Z992 Dependence on renal dialysis: Secondary | ICD-10-CM | POA: Diagnosis not present

## 2019-05-31 DIAGNOSIS — N185 Chronic kidney disease, stage 5: Secondary | ICD-10-CM | POA: Diagnosis not present

## 2019-05-31 DIAGNOSIS — G894 Chronic pain syndrome: Secondary | ICD-10-CM | POA: Diagnosis not present

## 2019-06-01 DIAGNOSIS — Z992 Dependence on renal dialysis: Secondary | ICD-10-CM | POA: Diagnosis not present

## 2019-06-01 DIAGNOSIS — N186 End stage renal disease: Secondary | ICD-10-CM | POA: Diagnosis not present

## 2019-06-02 DIAGNOSIS — N186 End stage renal disease: Secondary | ICD-10-CM | POA: Diagnosis not present

## 2019-06-02 DIAGNOSIS — Z992 Dependence on renal dialysis: Secondary | ICD-10-CM | POA: Diagnosis not present

## 2019-06-05 DIAGNOSIS — N186 End stage renal disease: Secondary | ICD-10-CM | POA: Diagnosis not present

## 2019-06-05 DIAGNOSIS — Z992 Dependence on renal dialysis: Secondary | ICD-10-CM | POA: Diagnosis not present

## 2019-06-08 DIAGNOSIS — Z992 Dependence on renal dialysis: Secondary | ICD-10-CM | POA: Diagnosis not present

## 2019-06-08 DIAGNOSIS — N186 End stage renal disease: Secondary | ICD-10-CM | POA: Diagnosis not present

## 2019-06-12 DIAGNOSIS — Z992 Dependence on renal dialysis: Secondary | ICD-10-CM | POA: Diagnosis not present

## 2019-06-12 DIAGNOSIS — N186 End stage renal disease: Secondary | ICD-10-CM | POA: Diagnosis not present

## 2019-06-15 DIAGNOSIS — Z992 Dependence on renal dialysis: Secondary | ICD-10-CM | POA: Diagnosis not present

## 2019-06-15 DIAGNOSIS — N186 End stage renal disease: Secondary | ICD-10-CM | POA: Diagnosis not present

## 2019-06-16 DIAGNOSIS — Z992 Dependence on renal dialysis: Secondary | ICD-10-CM | POA: Diagnosis not present

## 2019-06-16 DIAGNOSIS — N186 End stage renal disease: Secondary | ICD-10-CM | POA: Diagnosis not present

## 2019-06-20 DIAGNOSIS — N186 End stage renal disease: Secondary | ICD-10-CM | POA: Diagnosis not present

## 2019-06-20 DIAGNOSIS — Z992 Dependence on renal dialysis: Secondary | ICD-10-CM | POA: Diagnosis not present

## 2019-06-21 ENCOUNTER — Other Ambulatory Visit: Payer: Self-pay

## 2019-06-21 DIAGNOSIS — N186 End stage renal disease: Secondary | ICD-10-CM | POA: Diagnosis not present

## 2019-06-21 DIAGNOSIS — Z992 Dependence on renal dialysis: Secondary | ICD-10-CM | POA: Diagnosis not present

## 2019-06-21 NOTE — Patient Outreach (Signed)
[place] [facility]) Care Management  06/21/2019  Summer Cook January 09, 1966 [id number]    Attempted routine outreach with [name]. Outreach unsuccessful. Will follow-up within 3-4 business days.    [place] Care Management [phone]

## 2019-06-23 DIAGNOSIS — Z992 Dependence on renal dialysis: Secondary | ICD-10-CM | POA: Diagnosis not present

## 2019-06-23 DIAGNOSIS — N186 End stage renal disease: Secondary | ICD-10-CM | POA: Diagnosis not present

## 2019-06-26 DIAGNOSIS — Z992 Dependence on renal dialysis: Secondary | ICD-10-CM | POA: Diagnosis not present

## 2019-06-26 DIAGNOSIS — N2581 Secondary hyperparathyroidism of renal origin: Secondary | ICD-10-CM | POA: Diagnosis not present

## 2019-06-26 DIAGNOSIS — N186 End stage renal disease: Secondary | ICD-10-CM | POA: Diagnosis not present

## 2019-06-28 DIAGNOSIS — Z992 Dependence on renal dialysis: Secondary | ICD-10-CM | POA: Diagnosis not present

## 2019-06-28 DIAGNOSIS — N186 End stage renal disease: Secondary | ICD-10-CM | POA: Diagnosis not present

## 2019-06-29 ENCOUNTER — Other Ambulatory Visit: Payer: Medicare Other

## 2019-06-29 ENCOUNTER — Other Ambulatory Visit: Payer: Self-pay

## 2019-06-29 DIAGNOSIS — N186 End stage renal disease: Secondary | ICD-10-CM

## 2019-06-29 NOTE — Patient Outreach (Signed)
[facility]) Care Management  06/29/2019  REBBIE LAURICELLA August 18, 1966 [id number]   Successful outreach with Ms. Summer Cook. She continues to experience fatigue following dialysis but reports doing well over the past month. She remains compliant with medications and treatment recommendations. She anticipates starting the initial work-up for a kidney transplant within the next few months.   Current Outpatient Medications on File Prior to Visit  Medication Sig Dispense Refill  . acetaminophen (TYLENOL) 500 MG tablet Take 1,000 mg by mouth every 6 (six) hours as needed for moderate pain.    Marland Kitchen albuterol (PROVENTIL) (2.5 MG/3ML) 0.083% nebulizer solution Take 3 mLs (2.5 mg total) by nebulization every 4 (four) hours as needed for shortness of breath. 75 mL 12  . ALPRAZolam (XANAX) 0.25 MG tablet Take 1 tablet (0.25 mg total) by mouth at bedtime as needed for anxiety. (Patient not taking: Reported on 03/23/2019) 30 tablet 0  . aspirin EC 81 MG EC tablet Take 1 tablet (81 mg total) by mouth daily.    Marland Kitchen atorvastatin (LIPITOR) 40 MG tablet Take 1 tablet (40 mg total) by mouth daily at 6 PM. 30 tablet 3  . Cyanocobalamin (B-12 PO) Take 1 tablet by mouth daily.    . diclofenac sodium (VOLTAREN) 1 % GEL Apply 2 g topically 4 (four) times daily.    . diphenhydrAMINE (BENADRYL ALLERGY) 25 mg capsule Take 1 capsule (25 mg total) by mouth every 8 (eight) hours as needed for up to 4 days for itching or allergies. 10 capsule 0  . famotidine (PEPCID) 20 MG tablet Take 1 tablet (20 mg total) by mouth daily for 4 days. 4 tablet 0  . hydrALAZINE (APRESOLINE) 50 MG tablet Take 1 tablet (50 mg total) by mouth 3 (three) times daily. (Patient taking differently: Take 50 mg by mouth 2 (two) times a day. ) 270 tablet 3  . HYDROcodone-acetaminophen (NORCO) 5-325 MG tablet Take 1 tablet by mouth every 6 (six) hours as needed for moderate pain. 10 tablet 0  . isosorbide dinitrate (ISORDIL) 10 MG tablet Take 1 tablet  (10 mg total) by mouth 2 (two) times daily. 180 tablet 3  . lidocaine-prilocaine (EMLA) cream Apply 1 application topically as needed.    . loratadine (CLARITIN) 10 MG tablet Take 10 mg by mouth daily as needed for allergies.    . metoprolol succinate (TOPROL-XL) 50 MG 24 hr tablet Take 50 mg by mouth daily. Take with or immediately following a meal.    . nitroGLYCERIN (NITROSTAT) 0.4 MG SL tablet Place 1 tablet (0.4 mg total) under the tongue every 5 (five) minutes x 3 doses as needed for chest pain. 25 tablet 12  . sertraline (ZOLOFT) 100 MG tablet Take 100 mg by mouth daily.    Marland Kitchen venlafaxine (EFFEXOR) 37.5 MG tablet Take 37.5 mg by mouth 2 (two) times daily.     No current facility-administered medications on file prior to visit.      THN CM Care Plan Problem One     Most Recent Value  Care Plan Problem One  Risk for Admission   Role Documenting the Problem One  Care Management [place] for Problem One  Active  THN Long Term Goal   Over the next 90 days, patient will not be hospitalized for complications related to chronic illnesses.  THN Long Term Goal Start Date  04/17/19  Interventions for Problem One Long Term Goal  Discussed recent recommendations and changes regarding a kidney transplant. Patient is  compliant with medication and diet recommendations. Encouraged to continue using safety precautions to avoid injuries. Reviewed worsening s/sx and indications for seeking medical attention.    THN CM Short Term Goal #1   Over the next 30 days, patient wil take all medications as prescribed.  THN CM Short Term Goal #1 Start Date  04/17/19  THN CM Short Term Goal #1 Met Date  05/24/19  THN CM Short Term Goal #2   Over the next 30 days, patient will attend all MD appointments as scheduled.  THN CM Short Term Goal #2 Start Date  04/17/19  THN CM Short Term Goal #2 Met Date  05/24/19  THN CM Short Term Goal #3  Over the next 30 days, patient will avoid activities that cause  overexertion.  THN CM Short Term Goal #3 Start Date  04/17/19  [facility] CM Short Term Goal #3 Met Date  05/24/19      PLAN -Will follow-up next month.   [place] Care Management [phone]

## 2019-07-02 ENCOUNTER — Emergency Department (HOSPITAL_COMMUNITY)
Admission: EM | Admit: 2019-07-02 | Discharge: 2019-07-02 | Disposition: A | Discharge status: Home / Self Care | Payer: Medicare Other | Attending: Emergency Medicine | Admitting: Emergency Medicine

## 2019-07-02 ENCOUNTER — Other Ambulatory Visit: Payer: Self-pay

## 2019-07-02 ENCOUNTER — Encounter (HOSPITAL_COMMUNITY): Payer: Self-pay

## 2019-07-02 DIAGNOSIS — J111 Influenza due to unidentified influenza virus with other respiratory manifestations: Secondary | ICD-10-CM | POA: Diagnosis not present

## 2019-07-02 DIAGNOSIS — R509 Fever, unspecified: Secondary | ICD-10-CM | POA: Diagnosis present

## 2019-07-02 DIAGNOSIS — Z7982 Long term (current) use of aspirin: Secondary | ICD-10-CM | POA: Insufficient documentation

## 2019-07-02 DIAGNOSIS — I25110 Atherosclerotic heart disease of native coronary artery with unstable angina pectoris: Secondary | ICD-10-CM | POA: Diagnosis not present

## 2019-07-02 DIAGNOSIS — Z8673 Personal history of transient ischemic attack (TIA), and cerebral infarction without residual deficits: Secondary | ICD-10-CM | POA: Insufficient documentation

## 2019-07-02 DIAGNOSIS — Z87891 Personal history of nicotine dependence: Secondary | ICD-10-CM | POA: Diagnosis not present

## 2019-07-02 DIAGNOSIS — I5042 Chronic combined systolic (congestive) and diastolic (congestive) heart failure: Secondary | ICD-10-CM | POA: Insufficient documentation

## 2019-07-02 DIAGNOSIS — Z992 Dependence on renal dialysis: Secondary | ICD-10-CM | POA: Diagnosis not present

## 2019-07-02 DIAGNOSIS — J45909 Unspecified asthma, uncomplicated: Secondary | ICD-10-CM | POA: Diagnosis not present

## 2019-07-02 DIAGNOSIS — Z20828 Contact with and (suspected) exposure to other viral communicable diseases: Secondary | ICD-10-CM | POA: Diagnosis not present

## 2019-07-02 DIAGNOSIS — I132 Hypertensive heart and chronic kidney disease with heart failure and with stage 5 chronic kidney disease, or end stage renal disease: Secondary | ICD-10-CM | POA: Diagnosis not present

## 2019-07-02 DIAGNOSIS — E1122 Type 2 diabetes mellitus with diabetic chronic kidney disease: Secondary | ICD-10-CM | POA: Insufficient documentation

## 2019-07-02 DIAGNOSIS — I252 Old myocardial infarction: Secondary | ICD-10-CM | POA: Diagnosis not present

## 2019-07-02 DIAGNOSIS — Z79899 Other long term (current) drug therapy: Secondary | ICD-10-CM | POA: Diagnosis not present

## 2019-07-02 DIAGNOSIS — R6889 Other general symptoms and signs: Secondary | ICD-10-CM

## 2019-07-02 DIAGNOSIS — N186 End stage renal disease: Secondary | ICD-10-CM | POA: Diagnosis not present

## 2019-07-02 LAB — INFLUENZA PANEL BY PCR (TYPE A & B)
Influenza A By PCR: NEGATIVE
Influenza B By PCR: NEGATIVE

## 2019-07-02 NOTE — Discharge Instructions (Signed)
Your flu test is negative.  Your symptoms are likely side effect from recent flu shot.  A covid test was obtained.  You will be notify in the next 2-3 days if test positive.

## 2019-07-02 NOTE — ED Triage Notes (Signed)
Pt got flu shot on Wednesday and started getting body aches and chills. Said she just wants to be tested for flu.  Has dialysis M-W-F.

## 2019-07-02 NOTE — ED Provider Notes (Signed)
[facility] EMERGENCY DEPARTMENT Provider Note   CSN: [id number] Arrival date & time: 07/02/19  8315     History   Chief Complaint Chief Complaint  Patient presents with  . Flu Like Symptoms    HPI Summer Cook is a 52 y.o. female.     The history is provided by the patient. No language interpreter was used.     53 year old female with history of CAD, NSTEMI renal disease currently on dialysis, hypertension, anxiety presenting with cold symptoms.  Patient report 5 days ago she received a flu shot.  2 days later she developed generalized body aches, subjective fever, chills, fatigue.  Symptoms persist.  No associated headache, no shortness of breath, chest pain.  She denies nausea vomiting or diarrhea.  She denies any recent sick contact.  Denies any specific treatment tried at home.  Patient states she would like to be tested for the flu.  Her next dialysis session is tomorrow.  Past Medical History:  Diagnosis Date  . Allergy   . Anxiety   . Asthma   . CAD (coronary artery disease), native coronary artery    a. cath 05/20/17 - 100% distal PDA --> medical mangement (no intervention done)  . Candida esophagitis ([place])   . Chronic combined systolic and diastolic CHF (congestive heart failure) ([place])   . Depression   . End stage renal disease on dialysis [facility])    T/ Th/ Sat starting 4/28- [facility]  . Essential hypertension, benign   . GERD (gastroesophageal reflux disease)   . Gout   . Headache   . Hemorrhoids   . 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  Last was Prednisone '40mg'$ ; 2 wk in 10/2012.  She also was on Prednisone bridged with Cellcept briefly but stopped due to lack of insurance.   . Myocardial infarction ([place])   . Perinephric hematoma   . Pneumonia   . Pulmonary embolism ([place]) 04/2012  . Serrated adenoma of colon   . Steroid-induced diabetes  ([place])   . Stroke ([place])   . Thrush, oral 05/29/2011    Patient Active Problem List   Diagnosis Date Noted  . ESRD (end stage renal disease) ([place]) 03/09/2019  . Acute renal failure superimposed on stage 3 chronic kidney disease ([place]) 01/11/2019  . Renal hematoma, left   . Acute on chronic systolic congestive heart failure ([place])   . Iron deficiency anemia   . Fever 01/07/2019  . CKD (chronic kidney disease), stage V ([place]) 01/07/2019  . Cough 01/07/2019  . AKI (acute kidney injury) ([place]) 12/31/2018  . Chest pain 04/29/2018  . Unstable angina ([place]) 05/29/2017  . CAD (coronary artery disease): total distal RCA EF normal (05/20/17) 05/25/2017  . UTI (urinary tract infection) 05/25/2017  . Chest pain with moderate risk for cardiac etiology 05/23/2017  . Mixed hyperlipidemia   . NSTEMI (non-ST elevated myocardial infarction) ([place]) 05/20/2017  . Diabetes mellitus type 2, uncomplicated ([place]) 17/61/6073  . History of stroke   . Chronic back pain 12/23/2015  . Hypotension 02/16/2015  . Asthma, chronic 02/16/2015  . Anxiety state 02/16/2015  . Stage 3 chronic kidney disease ([place]) 04/28/2014  . Nausea with vomiting 01/25/2013  . Abnormal LFTs 01/25/2013  . Orthostatic hypotension 01/25/2013  . Rectal bleeding 12/26/2012  . History of pulmonary embolism 05/03/2012  . Acute respiratory failure with hypoxia ([place]) 06/05/2011  . Hemorrhoids 06/04/2011  . 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  Immunosuppressed status ([place]) 06/04/2011  . Depression 06/04/2011  . Obesity 06/04/2011  . Essential hypertension 03/18/2011  . Idiopathic thrombocytopenic purpura ([place]) 03/18/2011    Past Surgical History:  Procedure Laterality Date  . AV FISTULA PLACEMENT Right 01/23/2019   Procedure: ARTERIOVENOUS (AV) FISTULA CREATION RIGHT ARM;  Surgeon: Elam Dutch, MD;  Location: [place];  Service: Vascular;  Laterality: Right;  . AV FISTULA PLACEMENT Right 01/30/2019   Procedure: GORETEX GRAFT PLACEMENT  RIGHT ARM;   Surgeon: Elam Dutch, MD;  Location: [place];  Service: Vascular;  Laterality: Right;  . BONE MARROW BIOPSY    . CARDIAC CATHETERIZATION    . CARPAL TUNNEL RELEASE    . CARPAL TUNNEL RELEASE Left 05/20/2016   Procedure: CARPAL TUNNEL RELEASE;  Surgeon: Carole Civil, MD;  [facility];  Service: Orthopedics;  Laterality: Left;  . CARPAL TUNNEL RELEASE Right 07/16/2016   Procedure: RIGHT CARPAL TUNNEL RELEASE;  Surgeon: Carole Civil, MD;  [facility];  Service: Orthopedics;  Laterality: Right;  . CATARACT EXTRACTION    . CHOLECYSTECTOMY  2008  . COLONOSCOPY WITH ESOPHAGOGASTRODUODENOSCOPY (EGD) N/A 01/04/2013   Dr. Gala Romney: esophageal plaques with +KOH, hh. Gastric antrum abnormal, bx reactive gastropathy. Anal canal hemorrhoids, colonic tics, normal TI, single polyp (sessile serrated tubular adenoma). Next TCS 12/2017  . IR FLUORO GUIDE CV LINE RIGHT  01/10/2019  . IR US GUIDE VASC ACCESS RIGHT  01/10/2019  . LEFT HEART CATH AND CORONARY ANGIOGRAPHY N/A 05/20/2017   Procedure: LEFT HEART CATH AND CORONARY ANGIOGRAPHY;  Surgeon: Martinique, Peter M, MD;  Location: [place] CV LAB;  Service: Cardiovascular;  Laterality: N/A;  . 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 TEE WITHOUT CARDIOVERSION N/A 01/03/2016   Procedure: TRANSESOPHAGEAL ECHOCARDIOGRAM (TEE) WITH PROPOFOL;  Surgeon: Satira Sark, MD;  [facility];  Service: Cardiovascular;  Laterality: N/A;     OB History    Gravida      Para      Term      Preterm      AB      Living  1     SAB      TAB      Ectopic      Multiple      Live Births               Home Medications    Prior to Admission medications   Medication Sig Start Date End Date Taking? Authorizing Provider  acetaminophen (TYLENOL) 500 MG tablet Take 1,000 mg by mouth every 6 (six) hours as needed for moderate pain.    [provider]  albuterol (PROVENTIL) (2.5 MG/3ML) 0.083% nebulizer solution Take 3 mLs (2.5 mg  total) by nebulization every 4 (four) hours as needed for shortness of breath. 01/20/19   Elie Confer, MD  ALPRAZolam Duanne Moron) 0.25 MG tablet Take 1 tablet (0.25 mg total) by mouth at bedtime as needed for anxiety. Patient not taking: Reported on 03/23/2019 01/20/19   Elie Confer, MD  aspirin EC 81 MG EC tablet Take 1 tablet (81 mg total) by mouth daily. 05/23/17   Eileen Stanford, PA-C  atorvastatin (LIPITOR) 40 MG tablet Take 1 tablet (40 mg total) by mouth daily at 6 PM. 01/20/19   [place], Waneta Martins, MD  Cyanocobalamin (B-12 PO) Take 1 tablet by mouth daily.    [provider]  diclofenac sodium (VOLTAREN) 1 % GEL Apply 2 g topically 4 (four) times  daily. 06/06/17   Debbe Odea, MD  diphenhydrAMINE (BENADRYL ALLERGY) 25 mg capsule Take 1 capsule (25 mg total) by mouth every 8 (eight) hours as needed for up to 4 days for itching or allergies. 03/14/19 03/18/19  Otila Back, MD  famotidine (PEPCID) 20 MG tablet Take 1 tablet (20 mg total) by mouth daily for 4 days. 03/14/19 03/18/19  Otila Back, MD  hydrALAZINE (APRESOLINE) 50 MG tablet Take 1 tablet (50 mg total) by mouth 3 (three) times daily. Patient taking differently: Take 50 mg by mouth 2 (two) times a day.  01/26/19 04/26/19  Dunn, Nedra Hai, PA-C  HYDROcodone-acetaminophen (NORCO) 5-325 MG tablet Take 1 tablet by mouth every 6 (six) hours as needed for moderate pain. 01/30/19   Dagoberto Ligas, PA-C  isosorbide dinitrate (ISORDIL) 10 MG tablet Take 1 tablet (10 mg total) by mouth 2 (two) times daily. 01/26/19 01/21/20  Dunn, Nedra Hai, PA-C  lidocaine-prilocaine (EMLA) cream Apply 1 application topically as needed. 02/25/19   [provider]  loratadine (CLARITIN) 10 MG tablet Take 10 mg by mouth daily as needed for allergies.    [provider]  metoprolol succinate (TOPROL-XL) 50 MG 24 hr tablet Take 50 mg by mouth daily. Take with or immediately following a meal.    [provider]  nitroGLYCERIN (NITROSTAT)  0.4 MG SL tablet Place 1 tablet (0.4 mg total) under the tongue every 5 (five) minutes x 3 doses as needed for chest pain. 05/25/17   Delos Haring, PA-C  sertraline (ZOLOFT) 100 MG tablet Take 100 mg by mouth daily.    [provider]  venlafaxine (EFFEXOR) 37.5 MG tablet Take 37.5 mg by mouth 2 (two) times daily. 12/22/18   [provider]    Family History Family History  Problem Relation Age of Onset  . Cervical cancer Mother   . Lung cancer Father   . Colon polyps Father        ???  . Pneumonia Brother   . Colon cancer Paternal Grandmother 88  . AAA (abdominal aortic aneurysm) Paternal Grandmother   . Breast cancer Paternal Grandmother   . Colon cancer Paternal Grandfather 80  . Barrett's esophagus Paternal Aunt   . Stomach cancer Neg Hx   . Pancreatic cancer Neg Hx     Social History Social History   Tobacco Use  . Smoking status: Former Smoker    Years: 0.00    Types: Cigarettes    Quit date: 02/14/2009    Years since quitting: 10.3  . Smokeless tobacco: Current User    Types: Snuff  . Tobacco comment:  snus pouch   Substance Use Topics  . Alcohol use: No    Alcohol/week: 0.0 standard drinks  . Drug use: No     Allergies   Ciprofloxacin, Ceftriaxone, Dilaudid [hydromorphone hcl], Yellow jacket venom, Adhesive [tape], Brintellix [vortioxetine], Cymbalta [duloxetine hcl], and Penicillins   Review of Systems Review of Systems  All other systems reviewed and are negative.    Physical Exam Updated Vital Signs BP (!) 160/110 (BP Location: Right Arm) Comment: DIDN'T TAKE BP MEDS  Pulse 96   Temp 99.3 F (37.4 C) (Oral)   Resp 18   Ht '5\' 5"'$  (1.651 m)   Wt 81.5 kg   LMP 08/13/2011   SpO2 100%   BMI 29.90 kg/m   Physical Exam Vitals signs and nursing note reviewed.  Constitutional:      General: She is not in acute distress.    Appearance:  She is well-developed.  HENT:     Head: Atraumatic.  Eyes:     Conjunctiva/sclera:  Conjunctivae normal.  Neck:     Musculoskeletal: Neck supple.  Cardiovascular:     Rate and Rhythm: Normal rate and regular rhythm.  Pulmonary:     Breath sounds: No wheezing, rhonchi or rales.  Abdominal:     Palpations: Abdomen is soft.  Skin:    Findings: No rash.  Neurological:     Mental Status: She is alert and oriented to person, place, and time.  Psychiatric:        Mood and Affect: Mood normal.      ED Treatments / Results  Labs (all labs ordered are listed, but only abnormal results are displayed) Labs Reviewed  SARS CORONAVIRUS 2 (TAT 6-24 HRS)  INFLUENZA PANEL BY PCR (TYPE A & B)    EKG None  Radiology No results found.  Procedures Procedures (including critical care time)  Medications Ordered in ED Medications - No data to display   Initial Impression / Assessment and Plan / ED Course  I have reviewed the triage vital signs and the nursing notes.  Pertinent labs & imaging results that were available during my care of the patient were reviewed by me and considered in my medical decision making (see chart for details).        BP (!) 160/110 (BP Location: Right Arm) Comment: DIDN'T TAKE BP MEDS  Pulse 96   Temp 99.3 F (37.4 C) (Oral)   Resp 18   Ht '5\' 5"'$  (1.651 m)   Wt 81.5 kg   LMP 08/13/2011   SpO2 100%   BMI 29.90 kg/m    Final Clinical Impressions(s) / ED Diagnoses   Final diagnoses:  Flu-like symptoms    ED Discharge Orders    None     7:42 PM Patient recently received a flu shot and now having flulike symptoms.  She is requesting to be tested for the flu.  She is a dialysis patient, and the session is tomorrow.  She does not complain of any shortness of breath.  Patient is currently afebrile, no hypoxia.  She is otherwise well-appearing.  Will obtain flu and COVID testing.  Summer Cook was evaluated in Emergency Department on 07/02/2019 for the symptoms described in the history of present illness. She was evaluated in the  context of the global COVID-19 pandemic, which necessitated consideration that the patient might be at risk for infection with the SARS-CoV-2 virus that causes COVID-19. Institutional protocols and algorithms that pertain to the evaluation of patients at risk for COVID-19 are in a state of rapid change based on information released by regulatory bodies including the CDC and federal and state organizations. These policies and algorithms were followed during the patient's care in the ED.     Domenic Moras, PA-C 07/02/19 2109    Daleen Bo, MD 07/03/19 1010

## 2019-07-03 DIAGNOSIS — N186 End stage renal disease: Secondary | ICD-10-CM | POA: Diagnosis not present

## 2019-07-03 DIAGNOSIS — Z992 Dependence on renal dialysis: Secondary | ICD-10-CM | POA: Diagnosis not present

## 2019-07-03 DIAGNOSIS — J329 Chronic sinusitis, unspecified: Secondary | ICD-10-CM | POA: Diagnosis not present

## 2019-07-03 DIAGNOSIS — N185 Chronic kidney disease, stage 5: Secondary | ICD-10-CM | POA: Diagnosis not present

## 2019-07-03 DIAGNOSIS — E8779 Other fluid overload: Secondary | ICD-10-CM | POA: Diagnosis not present

## 2019-07-03 DIAGNOSIS — I959 Hypotension, unspecified: Secondary | ICD-10-CM | POA: Diagnosis not present

## 2019-07-03 DIAGNOSIS — I10 Essential (primary) hypertension: Secondary | ICD-10-CM | POA: Diagnosis not present

## 2019-07-03 LAB — SARS CORONAVIRUS 2 (TAT 6-24 HRS): SARS Coronavirus 2: NEGATIVE

## 2019-07-05 DIAGNOSIS — N186 End stage renal disease: Secondary | ICD-10-CM | POA: Diagnosis not present

## 2019-07-05 DIAGNOSIS — E8779 Other fluid overload: Secondary | ICD-10-CM | POA: Diagnosis not present

## 2019-07-05 DIAGNOSIS — Z992 Dependence on renal dialysis: Secondary | ICD-10-CM | POA: Diagnosis not present

## 2019-07-06 ENCOUNTER — Ambulatory Visit (INDEPENDENT_AMBULATORY_CARE_PROVIDER_SITE_OTHER)
Admission: RE | Admit: 2019-07-06 | Discharge: 2019-07-06 | Disposition: A | Discharge status: Home / Self Care | Payer: Medicare Other | Source: Ambulatory Visit | Attending: Vascular Surgery | Admitting: Vascular Surgery

## 2019-07-06 ENCOUNTER — Encounter: Payer: Self-pay | Admitting: Vascular Surgery

## 2019-07-06 ENCOUNTER — Ambulatory Visit (HOSPITAL_COMMUNITY)
Admission: RE | Admit: 2019-07-06 | Discharge: 2019-07-06 | Disposition: A | Discharge status: Home / Self Care | Payer: Medicare Other | Source: Ambulatory Visit | Attending: Vascular Surgery | Admitting: Vascular Surgery

## 2019-07-06 ENCOUNTER — Other Ambulatory Visit: Payer: Self-pay

## 2019-07-06 ENCOUNTER — Ambulatory Visit (INDEPENDENT_AMBULATORY_CARE_PROVIDER_SITE_OTHER): Payer: Medicare Other | Admitting: Vascular Surgery

## 2019-07-06 VITALS — BP 126/92 | HR 58 | Temp 97.9°F | Resp 20 | Ht 65.0 in | Wt 181.0 lb

## 2019-07-06 DIAGNOSIS — N186 End stage renal disease: Secondary | ICD-10-CM

## 2019-07-06 DIAGNOSIS — Z992 Dependence on renal dialysis: Secondary | ICD-10-CM

## 2019-07-06 NOTE — Progress Notes (Signed)
Referring Physician: Dr Lenora Boys  Patient name: Summer Cook MRN: [id number] DOB: 03/07/1966 Sex: female  REASON FOR CONSULT: Hemodialysis access  HPI: Summer Cook is a 53 y.o. female, end-stage renal disease currently dialyzing via left sided catheter.  She had a previous right upper arm AV graft which failed after about 3 months despite 1 angioplasty and stenting procedure.  She states that she also had several episodes of hypotension while on dialysis.  Her current dialysis today is Monday Wednesday Friday.  She is right-handed.  Other medical problems include anxiety, asthma, coronary artery disease, congestive heart failure, reflux all of which are currently stable.  Past Medical History:  Diagnosis Date  . Allergy   . Anxiety   . Asthma   . CAD (coronary artery disease), native coronary artery    a. cath 05/20/17 - 100% distal PDA --> medical mangement (no intervention done)  . Candida esophagitis ([place])   . Chronic combined systolic and diastolic CHF (congestive heart failure) ([place])   . Depression   . End stage renal disease on dialysis [facility])    T/ Th/ Sat starting 4/28- [facility]  . Essential hypertension, benign   . GERD (gastroesophageal reflux disease)   . Gout   . Headache   . Hemorrhoids   . 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  Last was Prednisone '40mg'$ ; 2 wk in 10/2012.  She also was on Prednisone bridged with Cellcept briefly but stopped due to lack of insurance.   . Myocardial infarction ([place])   . Perinephric hematoma   . Pneumonia   . Pulmonary embolism ([place]) 04/2012  . Serrated adenoma of colon   . Steroid-induced diabetes ([place])   . Stroke ([place])   . Thrush, oral 05/29/2011   Past Surgical History:  Procedure Laterality Date  . AV FISTULA PLACEMENT Right 01/23/2019   Procedure: ARTERIOVENOUS (AV) FISTULA CREATION RIGHT ARM;   Surgeon: Elam Dutch, MD;  Location: [place];  Service: Vascular;  Laterality: Right;  . AV FISTULA PLACEMENT Right 01/30/2019   Procedure: GORETEX GRAFT PLACEMENT  RIGHT ARM;  Surgeon: Elam Dutch, MD;  Location: [place];  Service: Vascular;  Laterality: Right;  . BONE MARROW BIOPSY    . CARDIAC CATHETERIZATION    . CARPAL TUNNEL RELEASE    . CARPAL TUNNEL RELEASE Left 05/20/2016   Procedure: CARPAL TUNNEL RELEASE;  Surgeon: Carole Civil, MD;  [facility];  Service: Orthopedics;  Laterality: Left;  . CARPAL TUNNEL RELEASE Right 07/16/2016   Procedure: RIGHT CARPAL TUNNEL RELEASE;  Surgeon: Carole Civil, MD;  [facility];  Service: Orthopedics;  Laterality: Right;  . CATARACT EXTRACTION    . CHOLECYSTECTOMY  2008  . COLONOSCOPY WITH ESOPHAGOGASTRODUODENOSCOPY (EGD) N/A 01/04/2013   Dr. Gala Romney: esophageal plaques with +KOH, hh. Gastric antrum abnormal, bx reactive gastropathy. Anal canal hemorrhoids, colonic tics, normal TI, single polyp (sessile serrated tubular adenoma). Next TCS 12/2017  . IR FLUORO GUIDE CV LINE RIGHT  01/10/2019  . IR US GUIDE VASC ACCESS RIGHT  01/10/2019  . LEFT HEART CATH AND CORONARY ANGIOGRAPHY N/A 05/20/2017   Procedure: LEFT HEART CATH AND CORONARY ANGIOGRAPHY;  Surgeon: Martinique, Peter M, MD;  Location: [place] CV [facility];  Service: Cardiovascular;  Laterality: N/A;  . 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 TEE WITHOUT CARDIOVERSION N/A 01/03/2016   Procedure: TRANSESOPHAGEAL ECHOCARDIOGRAM (TEE) WITH PROPOFOL;  Surgeon: Mikeal Hawthorne  Delman Kitten, MD;  [facility];  Service: Cardiovascular;  Laterality: N/A;    Family History  Problem Relation Age of Onset  . Cervical cancer Mother   . Lung cancer Father   . Colon polyps Father        ???  . Pneumonia Brother   . Colon cancer Paternal Grandmother 88  . AAA (abdominal aortic aneurysm) Paternal Grandmother   . Breast cancer Paternal Grandmother   . Colon cancer Paternal Grandfather 67  .  Barrett's esophagus Paternal Aunt   . Stomach cancer Neg Hx   . Pancreatic cancer Neg Hx     SOCIAL HISTORY: Social History   Socioeconomic History  . Marital status: Divorced    Spouse name: Not on file  . Number of children: 1  . Years of education: Not on file  . 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   Social Needs  . Financial resource strain: Not hard at all  . Food insecurity    Worry: Sometimes true    Inability: Sometimes true  . Transportation needs    Medical: No    Non-medical: No  Tobacco Use  . Smoking status: Former Smoker    Years: 0.00    Types: Cigarettes    Quit date: 02/14/2009    Years since quitting: 10.3  . Smokeless tobacco: Current User    Types: Snuff  . Tobacco comment:  snus pouch   Substance and Sexual Activity  . Alcohol use: No    Alcohol/week: 0.0 standard drinks  . Drug use: No  . Sexual activity: Never  Lifestyle  . Physical activity    Days per week: Patient refused    Minutes per session: Patient refused  . Stress: Rather much  Relationships  . Social connections    Talks on phone: More than three times a week    Gets together: More than three times a week    Attends religious service: Never    Active member of club or organization: No    Attends meetings of clubs or organizations: Never    Relationship status: Divorced  . Intimate partner violence    Fear of current or ex partner: No    Emotionally abused: No    Physically abused: No    Forced sexual activity: No  Other Topics Concern  . Not on file  Social History Narrative   Lives with her daughter and aunt in a one story home.  Has 1 daughter.  On disability.  Did work as a Optometrist and bus Geophysicist/field seismologist.  Education: some college.     Allergies  Allergen Reactions  . Ciprofloxacin Nausea And Vomiting  . Ceftriaxone Rash  . Dilaudid [Hydromorphone Hcl] Anxiety and Other (See  Comments)    Causes hallucinations, anxiety, and panic per patient  . Yellow Jacket Venom Swelling    Swelling at injection site  . Adhesive [Tape] Rash    Please use paper tape  . Brintellix [Vortioxetine] Itching  . Cymbalta [Duloxetine Hcl] Itching  . Penicillins Itching    Did it involve swelling of the face/tongue/throat, SOB, or low BP? No Did it involve sudden or severe rash/hives, skin peeling, or any reaction on the inside of your mouth or nose? No Did you need to seek medical attention at a hospital or doctor's office? No When did it last happen?53 yrs old If all  above answers are "NO", may proceed with cephalosporin use.    Current Outpatient Medications  Medication Sig Dispense Refill  . acetaminophen (TYLENOL) 500 MG tablet Take 1,000 mg by mouth every 6 (six) hours as needed for moderate pain.    Marland Kitchen albuterol (PROVENTIL) (2.5 MG/3ML) 0.083% nebulizer solution Take 3 mLs (2.5 mg total) by nebulization every 4 (four) hours as needed for shortness of breath. 75 mL 12  . atorvastatin (LIPITOR) 40 MG tablet Take 1 tablet (40 mg total) by mouth daily at 6 PM. 30 tablet 3  . dexamethasone (DECADRON) 2 MG tablet Take 2 mg by mouth 2 (two) times daily with a meal.    . diclofenac sodium (VOLTAREN) 1 % GEL Apply 2 g topically 4 (four) times daily.    Marland Kitchen doxycycline (VIBRAMYCIN) 100 MG capsule Take 100 mg by mouth 2 (two) times daily. For 10 days    . isosorbide dinitrate (ISORDIL) 10 MG tablet Take 1 tablet (10 mg total) by mouth 2 (two) times daily. 180 tablet 3  . lidocaine-prilocaine (EMLA) cream Apply 1 application topically as needed.    . loratadine (CLARITIN) 10 MG tablet Take 10 mg by mouth daily as needed for allergies.    . metoprolol succinate (TOPROL-XL) 50 MG 24 hr tablet Take 50 mg by mouth daily. Take with or immediately following a meal.    . nitroGLYCERIN (NITROSTAT) 0.4 MG SL tablet Place 1 tablet (0.4 mg total) under the tongue every 5 (five) minutes x 3 doses  as needed for chest pain. 25 tablet 12  . sertraline (ZOLOFT) 100 MG tablet Take 100 mg by mouth daily.    Marland Kitchen venlafaxine (EFFEXOR) 37.5 MG tablet Take 37.5 mg by mouth 2 (two) times daily.    . hydrALAZINE (APRESOLINE) 50 MG tablet Take 1 tablet (50 mg total) by mouth 3 (three) times daily. (Patient taking differently: Take 50 mg by mouth 2 (two) times a day. ) 270 tablet 3   No current facility-administered medications for this visit.     ROS:   General:  No weight loss, Fever, chills  HEENT: No recent headaches, no nasal bleeding, no visual changes, no sore throat  Neurologic: No dizziness, blackouts, seizures. No recent symptoms of stroke or mini- stroke. No recent episodes of slurred speech, or temporary blindness.  Cardiac: No recent episodes of chest pain/pressure, no shortness of breath at rest.  No shortness of breath with exertion.  Denies history of atrial fibrillation or irregular heartbeat  Vascular: No history of rest pain in feet.  No history of claudication.  No history of non-healing ulcer, No history of DVT   Pulmonary: No home oxygen, no productive cough, no hemoptysis,  No asthma or wheezing  Musculoskeletal:  '[ ]'$  Arthritis, '[ ]'$  Low back pain,  '[ ]'$  Joint pain  Hematologic:No history of hypercoagulable state.  No history of easy bleeding.  + history of anemia  Gastrointestinal: No hematochezia or melena,  No gastroesophageal reflux, no trouble swallowing  Urinary: '[X]'$  chronic Kidney disease, '[X]'$  on HD - '[X]'$  MWF or '[ ]'$  TTHS, '[ ]'$  Burning with urination, '[ ]'$  Frequent urination, '[ ]'$  Difficulty urinating;   Skin: No rashes  Psychological: No history of anxiety,  No history of depression   Physical Examination  Vitals:   07/06/19 0851  BP: (!) 126/92  Pulse: (!) 58  Resp: 20  Temp: 97.9 F (36.6 C)  SpO2: 95%  Weight: 181 lb (82.1 kg)  Height: '5\' 5"'$  (1.651 m)  Body mass index is 30.12 kg/m.  General:  Alert and oriented, no acute distress HEENT:  Normal Neck: No JVD Cardiac: Regular Rate and Rhythm Skin: No rash Extremity Pulses:  2+ radial, brachial pulses bilaterally, occluded right upper arm AV graft Musculoskeletal: No deformity or edema  Neurologic: Upper and lower extremity motor 5/5 and symmetric  DATA:  Patient had a vein mapping ultrasound today.  I reviewed and interpreted the study.  Right basilic vein was 3 to 4 mm.  Right cephalic vein was 3 mm at the wrist but occluded in the upper arm.  Left cephalic vein was 3 mm from the antecubital him up it was 2-1/2 mm in the forearm.  Left basilic vein was 4 to 5 mm.  She had a normal upper extremity arterial duplex.  ASSESSMENT: Patient with early failure of right upper arm AV graft.  She seems to have a reasonable cephalic vein on the left side.  Duplex in the vascular lab suggested the vein was greater than 3 mm.  On my exam with the SonoSite at the bedside it was about 2-1/2 to 3 mm in diameter.   PLAN: Left brachiocephalic AV fistula scheduled for placement on July 18, 2019.  Risk benefits possible complications and procedure details including not limited to bleeding infection ischemic steal non-maturation of the fistula fistula thrombosis need for other further procedures were discussed with patient today.  She understands and agrees to proceed.   Ruta Hinds, MD [facility] Office: [phone] Pager: [phone]

## 2019-07-07 DIAGNOSIS — Z992 Dependence on renal dialysis: Secondary | ICD-10-CM | POA: Diagnosis not present

## 2019-07-07 DIAGNOSIS — E8779 Other fluid overload: Secondary | ICD-10-CM | POA: Diagnosis not present

## 2019-07-07 DIAGNOSIS — N186 End stage renal disease: Secondary | ICD-10-CM | POA: Diagnosis not present

## 2019-07-10 DIAGNOSIS — D509 Iron deficiency anemia, unspecified: Secondary | ICD-10-CM | POA: Diagnosis not present

## 2019-07-10 DIAGNOSIS — N186 End stage renal disease: Secondary | ICD-10-CM | POA: Diagnosis not present

## 2019-07-10 DIAGNOSIS — E119 Type 2 diabetes mellitus without complications: Secondary | ICD-10-CM | POA: Diagnosis not present

## 2019-07-10 DIAGNOSIS — Z992 Dependence on renal dialysis: Secondary | ICD-10-CM | POA: Diagnosis not present

## 2019-07-10 DIAGNOSIS — E8779 Other fluid overload: Secondary | ICD-10-CM | POA: Diagnosis not present

## 2019-07-10 DIAGNOSIS — N2581 Secondary hyperparathyroidism of renal origin: Secondary | ICD-10-CM | POA: Diagnosis not present

## 2019-07-12 DIAGNOSIS — Z992 Dependence on renal dialysis: Secondary | ICD-10-CM | POA: Diagnosis not present

## 2019-07-12 DIAGNOSIS — E8779 Other fluid overload: Secondary | ICD-10-CM | POA: Diagnosis not present

## 2019-07-12 DIAGNOSIS — N186 End stage renal disease: Secondary | ICD-10-CM | POA: Diagnosis not present

## 2019-07-14 ENCOUNTER — Other Ambulatory Visit: Payer: Self-pay

## 2019-07-14 ENCOUNTER — Other Ambulatory Visit (HOSPITAL_COMMUNITY)
Admission: RE | Admit: 2019-07-14 | Discharge: 2019-07-14 | Disposition: A | Discharge status: Home / Self Care | Payer: Medicare Other | Source: Ambulatory Visit | Attending: Vascular Surgery | Admitting: Vascular Surgery

## 2019-07-14 DIAGNOSIS — Z20828 Contact with and (suspected) exposure to other viral communicable diseases: Secondary | ICD-10-CM | POA: Insufficient documentation

## 2019-07-14 DIAGNOSIS — Z01812 Encounter for preprocedural laboratory examination: Secondary | ICD-10-CM | POA: Diagnosis not present

## 2019-07-14 DIAGNOSIS — E8779 Other fluid overload: Secondary | ICD-10-CM | POA: Diagnosis not present

## 2019-07-14 DIAGNOSIS — Z992 Dependence on renal dialysis: Secondary | ICD-10-CM | POA: Diagnosis not present

## 2019-07-14 DIAGNOSIS — N186 End stage renal disease: Secondary | ICD-10-CM | POA: Diagnosis not present

## 2019-07-14 LAB — SARS CORONAVIRUS 2 (TAT 6-24 HRS): SARS Coronavirus 2: NEGATIVE

## 2019-07-17 ENCOUNTER — Other Ambulatory Visit: Payer: Self-pay

## 2019-07-17 ENCOUNTER — Encounter (HOSPITAL_COMMUNITY): Payer: Self-pay | Admitting: *Deleted

## 2019-07-17 DIAGNOSIS — E8779 Other fluid overload: Secondary | ICD-10-CM | POA: Diagnosis not present

## 2019-07-17 DIAGNOSIS — N2581 Secondary hyperparathyroidism of renal origin: Secondary | ICD-10-CM | POA: Diagnosis not present

## 2019-07-17 DIAGNOSIS — Z992 Dependence on renal dialysis: Secondary | ICD-10-CM | POA: Diagnosis not present

## 2019-07-17 DIAGNOSIS — N186 End stage renal disease: Secondary | ICD-10-CM | POA: Diagnosis not present

## 2019-07-17 MED ORDER — VANCOMYCIN HCL IN DEXTROSE 1-5 GM/200ML-% IV SOLN
1000.0000 mg | INTRAVENOUS | Status: DC
  Filled 2019-07-17: qty 200

## 2019-07-17 NOTE — Progress Notes (Signed)
Pt denies SOB and chest pain. Pt stated that she is under the care of Dr. Domenic Polite, Cardiology and [name], PCP.  Pt made aware to stop taking vitamins, fish oil and herbal medications. Do not take any NSAIDs ie: Ibuprofen, Advil, Naproxen (Aleve), Motrin, Voltaren, BC and Goody Powder. Pt stated that she does not take diabetes medication unless she is on Decadron. Pt made aware to check CBG every 2 hours prior to arrival to hospital on DOS. Pt made aware to treat a CBG < 70 with  4 ounces of apple juice, wait 15 minutes after intervention to recheck CBG, if CBG remains < 70, call Short Stay unit to speak with a nurse. Pt verbalized understanding of all pre-op instructions.

## 2019-07-18 ENCOUNTER — Encounter (HOSPITAL_COMMUNITY): Payer: Self-pay | Admitting: *Deleted

## 2019-07-18 ENCOUNTER — Other Ambulatory Visit: Payer: Self-pay

## 2019-07-18 ENCOUNTER — Ambulatory Visit (HOSPITAL_COMMUNITY)
Admission: RE | Admit: 2019-07-18 | Discharge: 2019-07-18 | Disposition: A | Discharge status: Home / Self Care | Payer: Medicare Other | Attending: Vascular Surgery | Admitting: Vascular Surgery

## 2019-07-18 ENCOUNTER — Ambulatory Visit (HOSPITAL_COMMUNITY): Payer: Medicare Other | Admitting: Anesthesiology

## 2019-07-18 ENCOUNTER — Encounter (HOSPITAL_COMMUNITY)
Admission: RE | Disposition: A | Discharge status: Home / Self Care | Payer: Self-pay | Source: Home / Self Care | Attending: Vascular Surgery

## 2019-07-18 DIAGNOSIS — Z538 Procedure and treatment not carried out for other reasons: Secondary | ICD-10-CM | POA: Insufficient documentation

## 2019-07-18 SURGERY — ARTERIOVENOUS (AV) FISTULA CREATION
Anesthesia: Monitor Anesthesia Care | Laterality: Left

## 2019-07-18 MED ORDER — SODIUM CHLORIDE 0.9 % IV SOLN
INTRAVENOUS | Status: DC
  Administered 2019-07-18: 10:00:00 via INTRAVENOUS

## 2019-07-18 MED ORDER — MIDAZOLAM HCL 2 MG/2ML IJ SOLN
INTRAMUSCULAR | Status: AC
  Filled 2019-07-18: qty 2

## 2019-07-18 MED ORDER — CHLORHEXIDINE GLUCONATE 4 % EX LIQD: 60.0000 mL | Freq: Once | CUTANEOUS | Status: DC

## 2019-07-18 MED ORDER — FENTANYL CITRATE (PF) 250 MCG/5ML IJ SOLN
INTRAMUSCULAR | Status: AC
  Filled 2019-07-18: qty 5

## 2019-07-18 MED ORDER — PROPOFOL 10 MG/ML IV BOLUS
INTRAVENOUS | Status: AC
  Filled 2019-07-18: qty 20

## 2019-07-18 MED ORDER — SODIUM CHLORIDE 0.9 % IV SOLN
INTRAVENOUS | Status: AC | PRN
  Administered 2019-07-18: 09:00:00 via INTRAVENOUS

## 2019-07-18 NOTE — Progress Notes (Signed)
Patient decided to reschedule case due to physician availability.

## 2019-07-18 NOTE — H&P (Signed)
Pt reschedule due to emergency.  No H and P performed  Ruta Hinds, MD [facility] Office: [phone]

## 2019-07-19 DIAGNOSIS — N186 End stage renal disease: Secondary | ICD-10-CM | POA: Diagnosis not present

## 2019-07-19 DIAGNOSIS — Z992 Dependence on renal dialysis: Secondary | ICD-10-CM | POA: Diagnosis not present

## 2019-07-19 DIAGNOSIS — E8779 Other fluid overload: Secondary | ICD-10-CM | POA: Diagnosis not present

## 2019-07-21 IMAGING — DX DG CHEST 2V
2 series · 2 of 2 positions shown · non-contrast
Comparison: 11/27/2017

CLINICAL DATA: CHEST PAIN, PER ER NOTE, Pt c/o LLQ pain x 2 weeks.
States it feels bruised. C/o chest pressure since this am. Took 3
ntg with no relief. Pt Ewe it is Nya of stress. C/o
heartburn/nausea. HISTORY OF DM, HTN

EXAM:
CHEST - 2 VIEW

[chest pa]
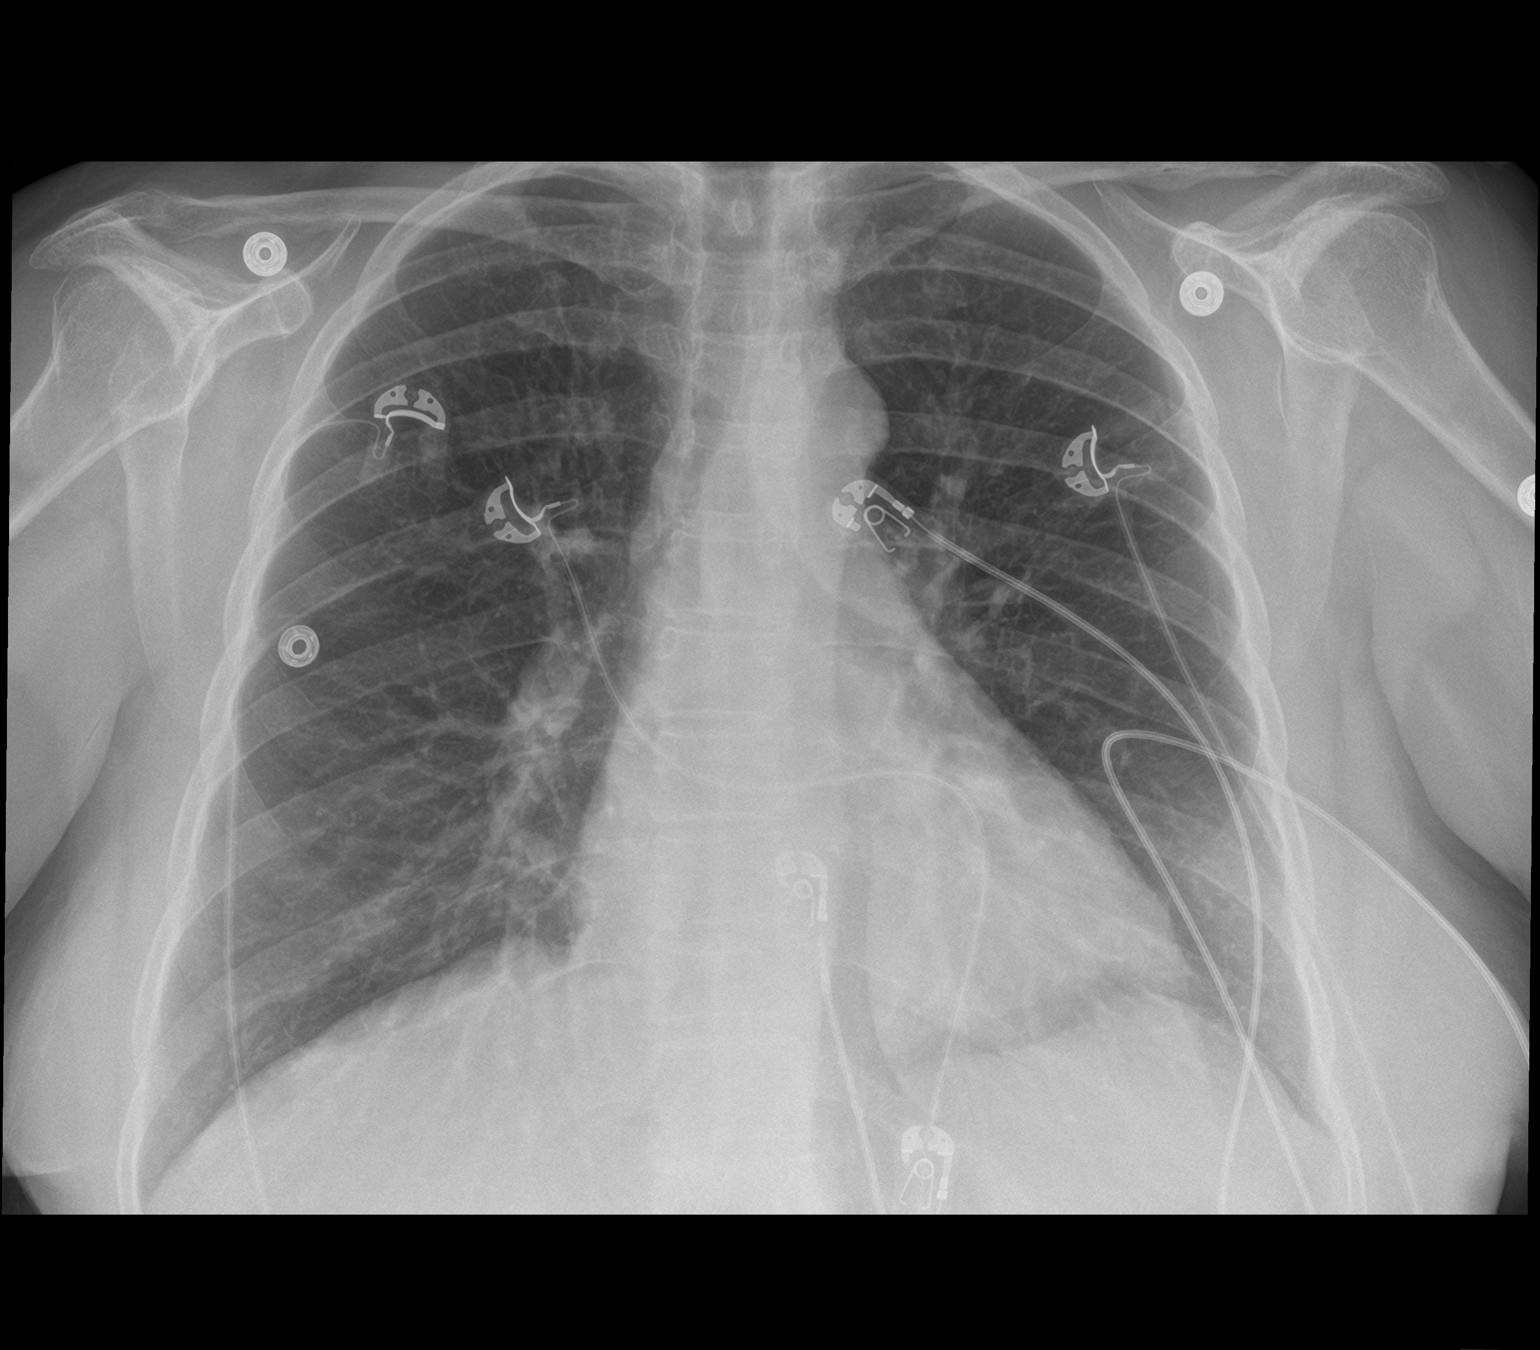

[chest lat]
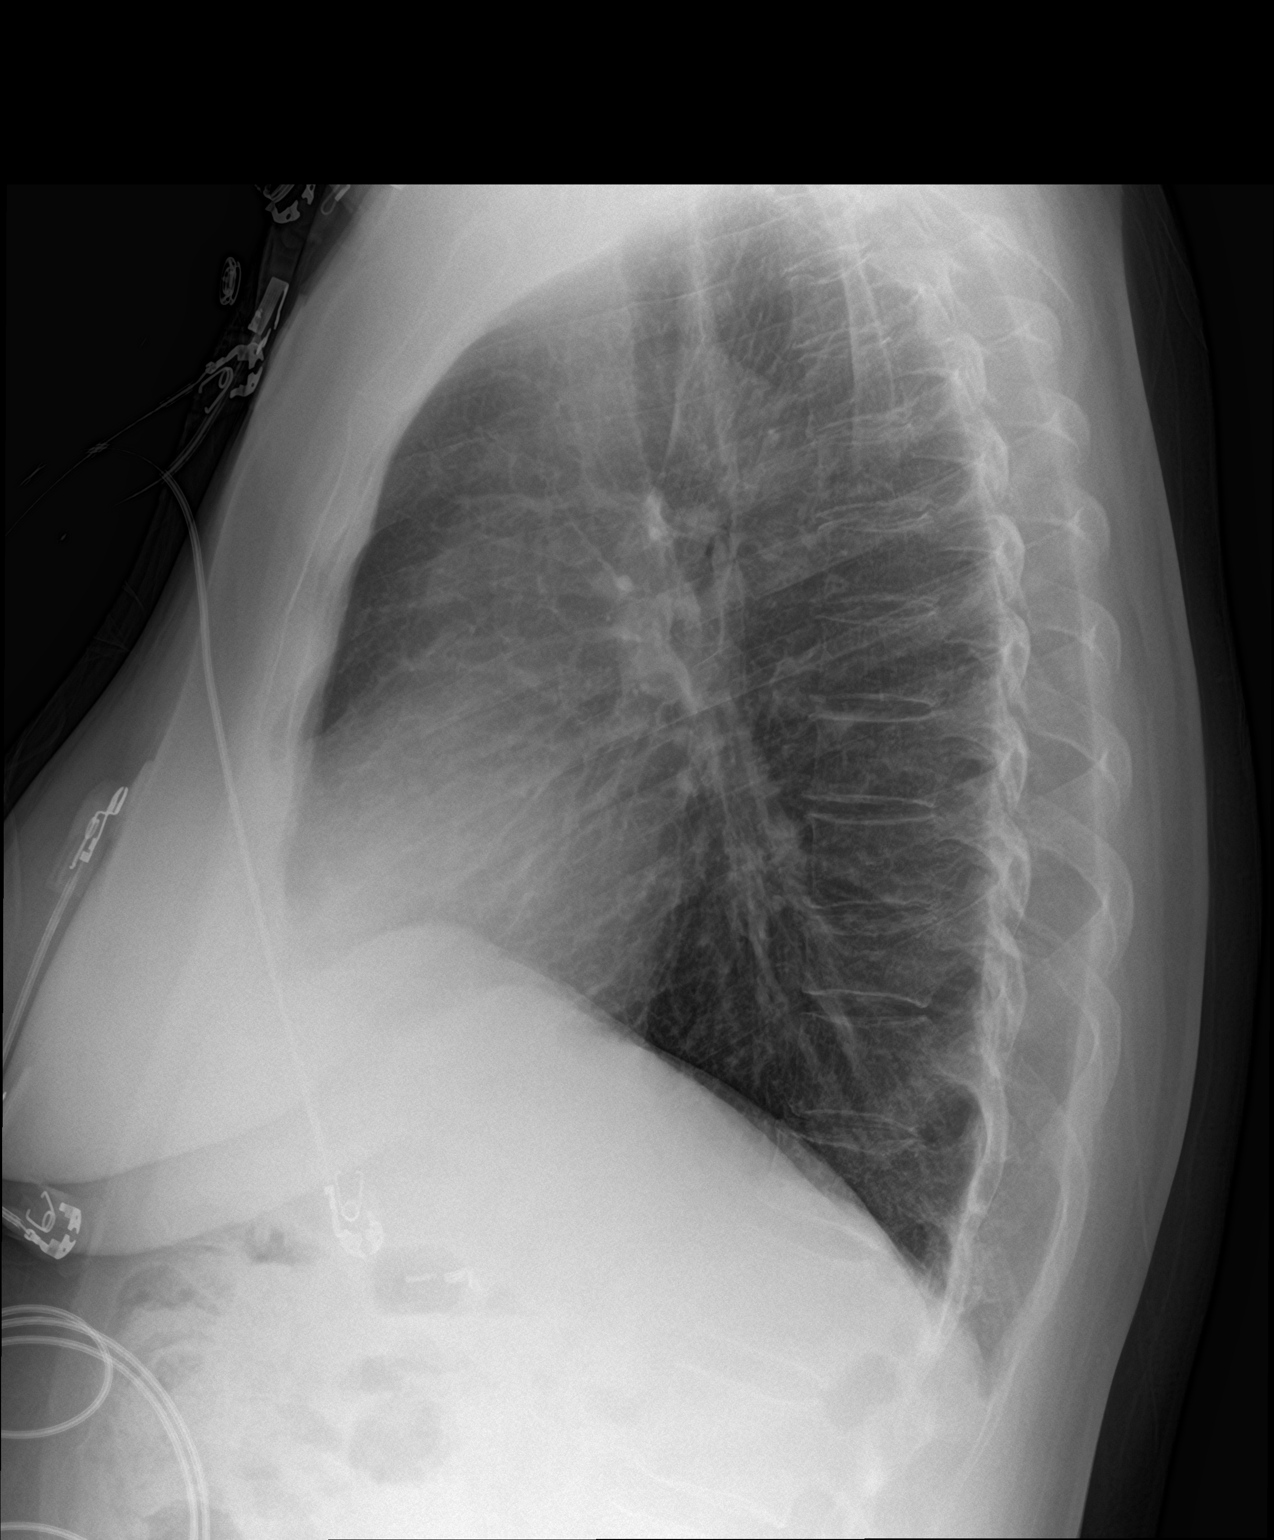

[2 of 2 positions shown; findings below may reference images not displayed]

FINDINGS: Lungs are clear.

Heart size and mediastinal contours are within normal limits.

No effusion.

Stable wedge deformity in the lower thoracic spine. Cholecystectomy
clips.
IMPRESSION: No acute cardiopulmonary disease.

## 2019-07-21 IMAGING — CT CT ABD-PELV W/O CM
2 of 4 series · 16 of 46 positions shown, 18 images · non-contrast
Comparison: CT abdomen pelvis 06/14/2017.

CLINICAL DATA: Patient with left lower quadrant pain for 2 weeks.

EXAM:
CT ABDOMEN AND PELVIS WITHOUT CONTRAST
TECHNIQUE: Multidetector CT imaging of the abdomen and pelvis was performed
following the standard protocol without IV contrast.

[Series 2: axial st · axial · 0.85mm/px · z∈[+627,+1067]mm · 13 of 98 slices shown, 15 images]
[im 5/98  soft-tissue]
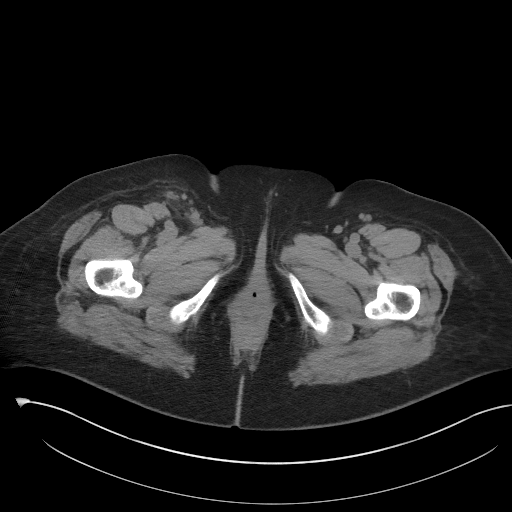
[im 5/98  bone]
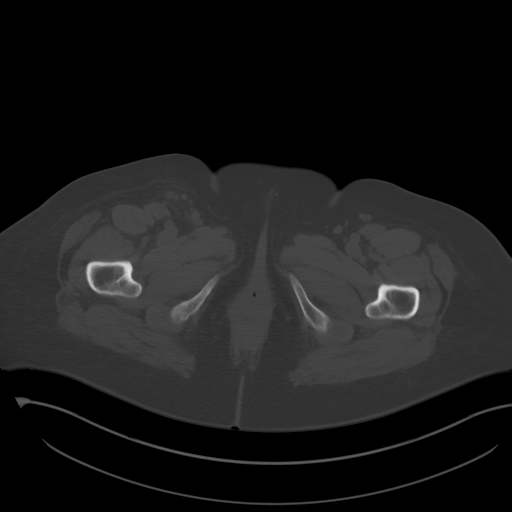
[im 13/98  soft-tissue]
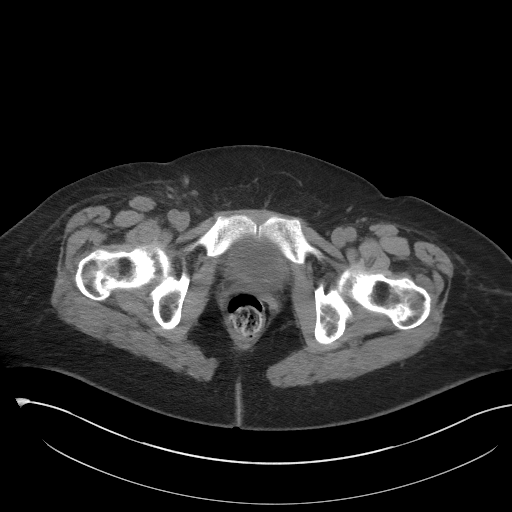
[im 22/98  soft-tissue]
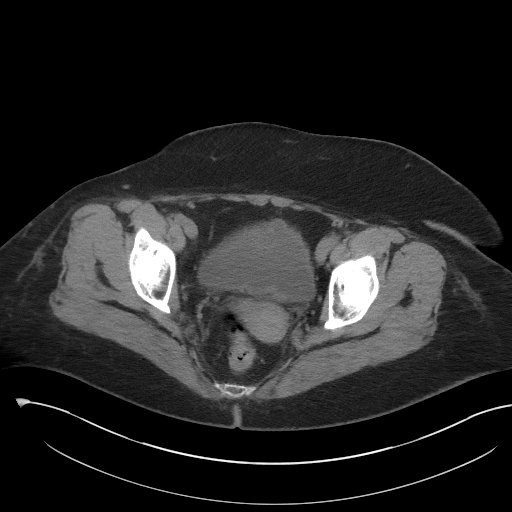
[im 26/98  soft-tissue]
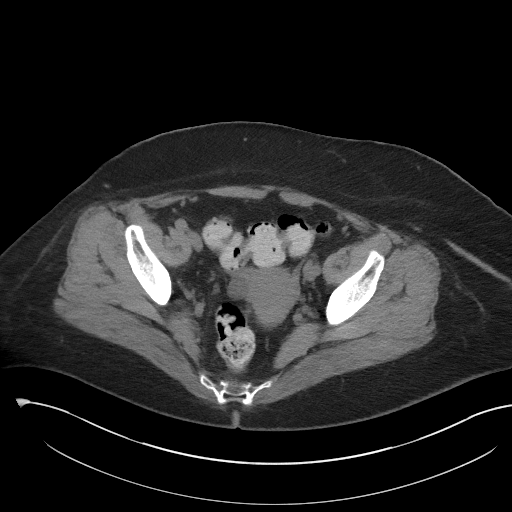
[im 34/98  soft-tissue]
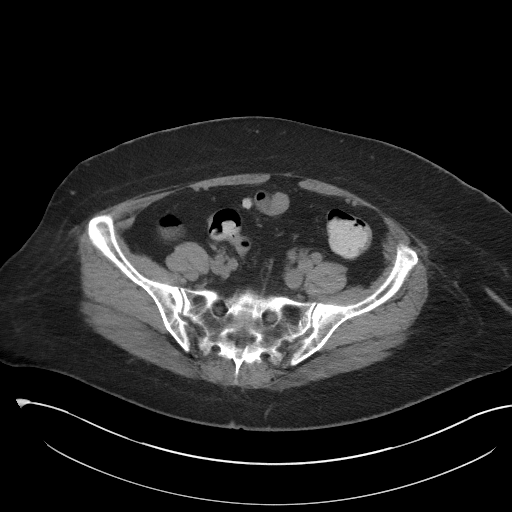
[im 43/98  soft-tissue]
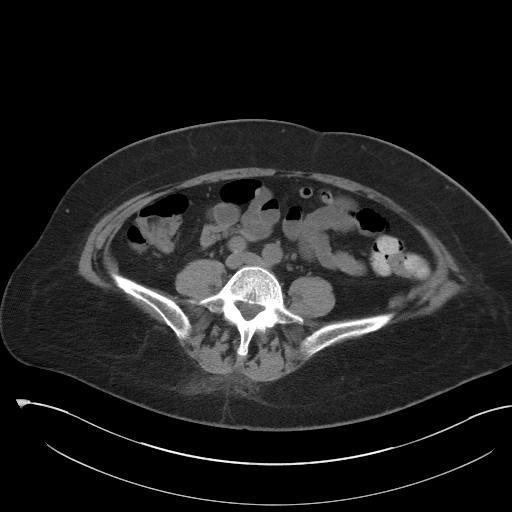
[im 51/98  soft-tissue]
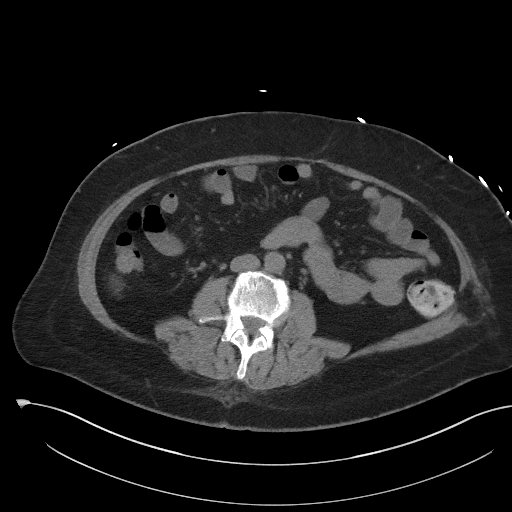
[im 55/98  soft-tissue]
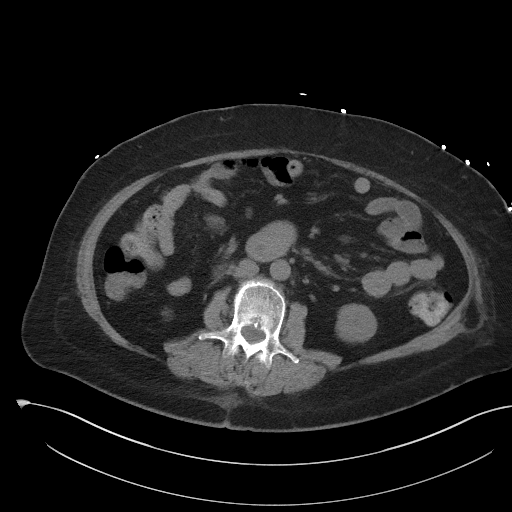
[im 64/98  soft-tissue]
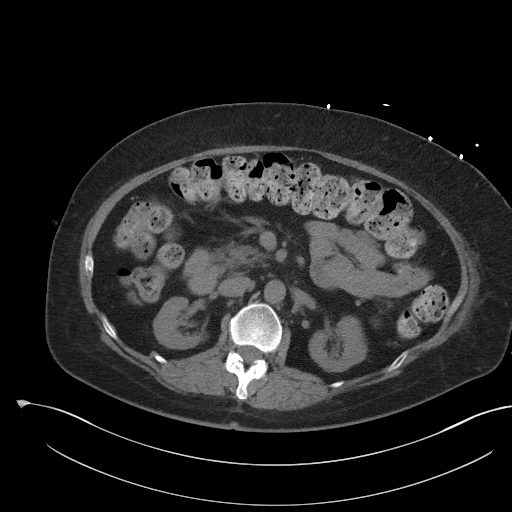
[im 64/98  bone]
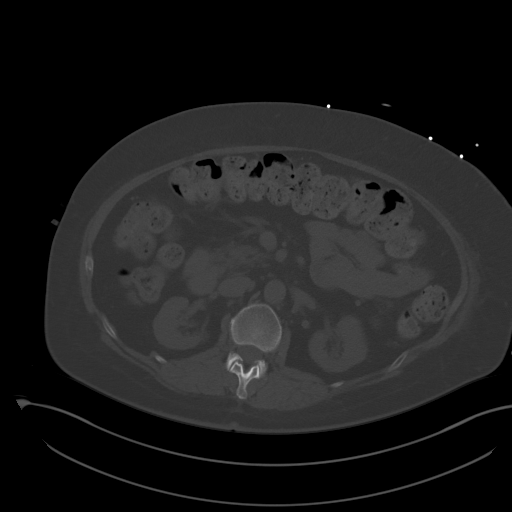
[im 72/98  soft-tissue]
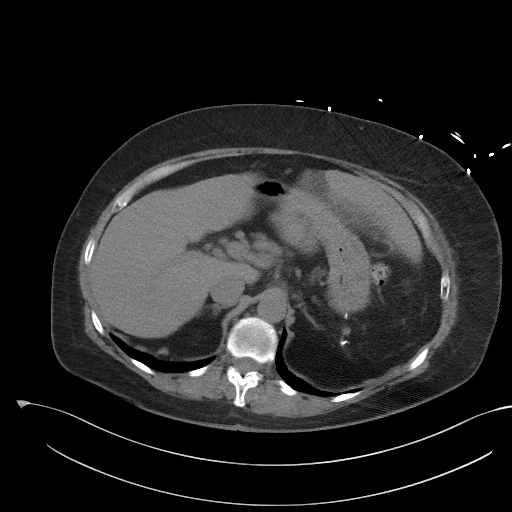
[im 76/98  soft-tissue]
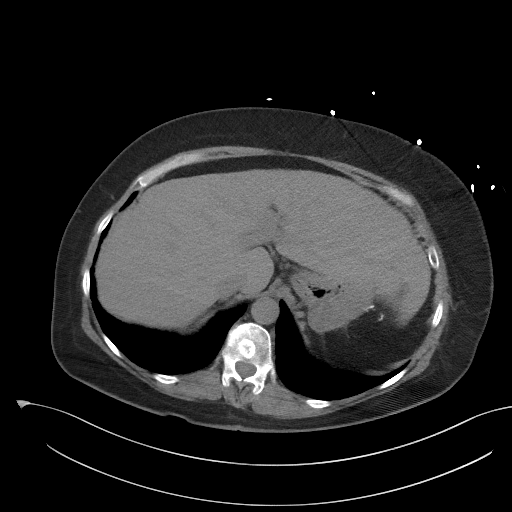
[im 85/98  soft-tissue]
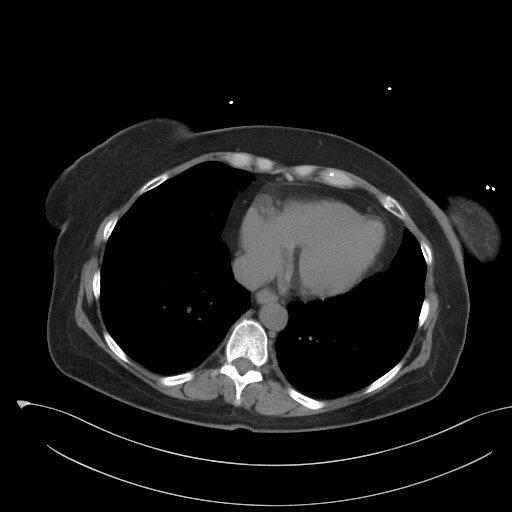
[im 93/98  soft-tissue]
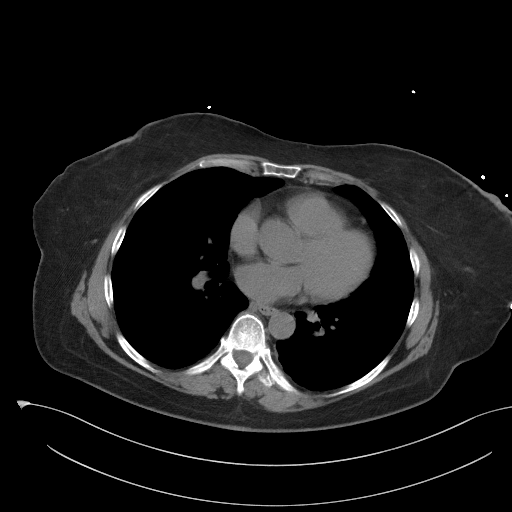

[Series 5: coronal st · coronal · 0.95mm/px · 3 of 89 slices shown]
[im 30/89  soft-tissue]
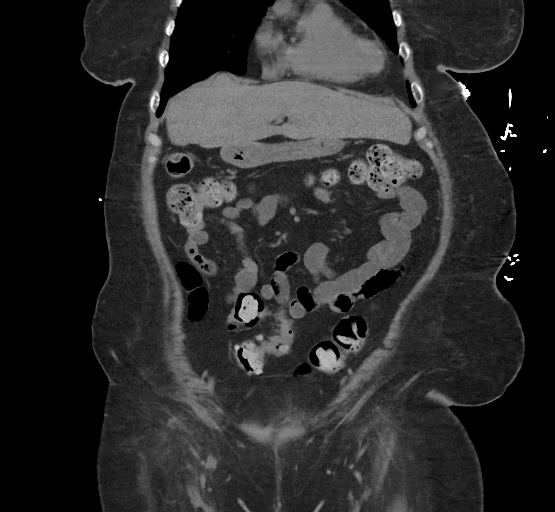
[im 40/89  soft-tissue]
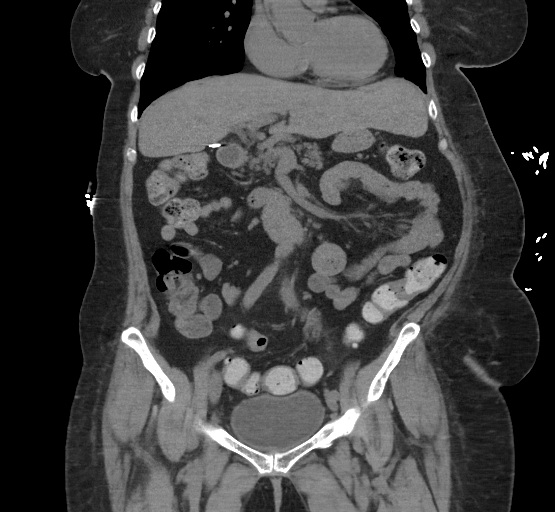
[im 49/89  soft-tissue]
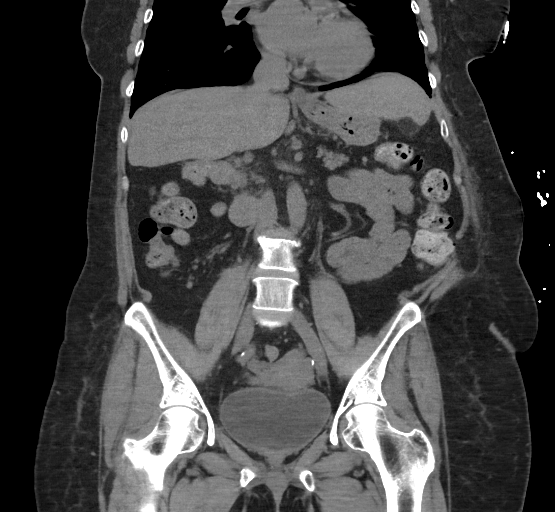

[16 of 46 positions shown; findings below may reference images not displayed]

FINDINGS: Lower chest: Normal heart size. New 6 mm left lower lobe nodule
(image 14; series 4). No pleural effusion.

Hepatobiliary: The liver is nodular in contour. Left hepatic lobe
hypertrophy. Status post cholecystectomy. No intrahepatic or
extrahepatic biliary ductal dilatation.

Pancreas: Unremarkable

Spleen: Prior splenectomy.

Adrenals/Urinary Tract: Adrenal glands are normal. Kidneys are
symmetric in size. No hydronephrosis. Urinary bladder is
unremarkable.

Stomach/Bowel: No abnormal bowel wall thickening or evidence for
bowel obstruction. No free fluid or free intraperitoneal air. Normal
morphology of the stomach.

Vascular/Lymphatic: Normal caliber abdominal aorta. Persistent
dilatation of the left common iliac artery measuring 1.8 cm. No
retroperitoneal or pelvic adenopathy.

Reproductive: Uterus and adnexal structures unremarkable.

Other: None.

Musculoskeletal: No aggressive or acute appearing osseous lesions.
Lumbar spine degenerative changes.
IMPRESSION: 1. There is a new 6 mm left lower lobe pulmonary nodule.
Non-contrast chest CT at 6 months is recommended. If the nodule is
stable at time of repeat CT, then future CT at 18-24 months (from
today's scan) is considered optional for low-risk patients, but is
recommended for high-risk patients. This recommendation follows the
consensus statement: Guidelines for Management of Incidental
Pulmonary Nodules Detected on CT Images: From the [HOSPITAL]
2. No acute process within the abdomen or pelvis.
3. Mild nodular contour of the liver with left hepatic lobe
hypertrophy raising the possibility of cirrhosis.
4. Prior splenectomy.

## 2019-07-22 DIAGNOSIS — N186 End stage renal disease: Secondary | ICD-10-CM | POA: Diagnosis not present

## 2019-07-22 DIAGNOSIS — E8779 Other fluid overload: Secondary | ICD-10-CM | POA: Diagnosis not present

## 2019-07-22 DIAGNOSIS — Z992 Dependence on renal dialysis: Secondary | ICD-10-CM | POA: Diagnosis not present

## 2019-07-24 DIAGNOSIS — N2581 Secondary hyperparathyroidism of renal origin: Secondary | ICD-10-CM | POA: Diagnosis not present

## 2019-07-24 DIAGNOSIS — Z992 Dependence on renal dialysis: Secondary | ICD-10-CM | POA: Diagnosis not present

## 2019-07-24 DIAGNOSIS — N186 End stage renal disease: Secondary | ICD-10-CM | POA: Diagnosis not present

## 2019-07-24 DIAGNOSIS — E8779 Other fluid overload: Secondary | ICD-10-CM | POA: Diagnosis not present

## 2019-07-27 ENCOUNTER — Other Ambulatory Visit: Payer: Self-pay

## 2019-07-27 ENCOUNTER — Ambulatory Visit (HOSPITAL_COMMUNITY)
Admission: RE | Admit: 2019-07-27 | Discharge: 2019-07-27 | Disposition: A | Discharge status: Home / Self Care | Payer: Medicare Other | Source: Ambulatory Visit | Attending: Student | Admitting: Student

## 2019-07-27 DIAGNOSIS — I428 Other cardiomyopathies: Secondary | ICD-10-CM | POA: Diagnosis not present

## 2019-07-27 DIAGNOSIS — N186 End stage renal disease: Secondary | ICD-10-CM | POA: Diagnosis not present

## 2019-07-27 DIAGNOSIS — Z992 Dependence on renal dialysis: Secondary | ICD-10-CM | POA: Diagnosis not present

## 2019-07-27 DIAGNOSIS — E8779 Other fluid overload: Secondary | ICD-10-CM | POA: Diagnosis not present

## 2019-07-27 NOTE — Progress Notes (Signed)
*  PRELIMINARY RESULTS* Echocardiogram 2D Echocardiogram has been performed.  Summer Cook 07/27/2019, 8:46 AM

## 2019-07-27 NOTE — Progress Notes (Signed)
Virtual Visit via Telephone Note   This visit type was conducted due to national recommendations for restrictions regarding the COVID-19 Pandemic (e.g. social distancing) in an effort to limit this patient's exposure and mitigate transmission in our community.  Due to her co-morbid illnesses, this patient is at least at moderate risk for complications without adequate follow up.  This format is felt to be most appropriate for this patient at this time.  The patient did not have access to video technology/had technical difficulties with video requiring transitioning to audio format only (telephone).  All issues noted in this document were discussed and addressed.  No physical exam could be performed with this format.  Please refer to the patient's chart for her  consent to telehealth for [facility].   Date:  07/28/2019   ID:  Summer Cook, DOB 02/04/1966, MRN [id number]  Patient Location: Home Provider Location: Home  PCP:  Redmond School, MD  Cardiologist:  Rozann Lesches, MD Electrophysiologist:  None   Evaluation Performed:  Follow-Up Visit  Chief Complaint:   Cardiac follow-up  History of Present Illness:    Summer Cook is a 53 y.o. female last assessed via telehealth encounter by Ms. Strader PA-C in June.  We spoke by phone today.  She tells me that she has been undergoing regular hemodialysis on Tuesday, Thursday, and Saturday at [facility] in [place].  From a cardiac perspective, she has had no obvious chest pain, stable NYHA class II dyspnea.  Follow-up echocardiogram performed yesterday revealed LVEF approximately 40% with mild diastolic dysfunction, low normal RV contraction, moderate mitral regurgitation, and normal PASP.  Myoview from April of this year revealed a small area of apical anterior scar with no active ischemia.  I reviewed her medications which are outlined below.  Current cardiac regimen includes Toprol-XL, Isordil, hydralazine, and Lipitor.  Fluid  status is managed via hemodialysis.   She tells me that she has had some low blood pressures during hemodialysis sessions.  I would anticipate ultimately that she may need to reduce her hydralazine dose on the mornings of hemodialysis or even potentially hold it.  We will try to stay consistent with regular therapy on the other days however.   The patient does not have symptoms concerning for COVID-19 infection (fever, chills, cough, or new shortness of breath).    Past Medical History:  Diagnosis Date  . Allergy   . Anxiety   . Asthma   . CAD (coronary artery disease), native coronary artery    a. cath 05/20/17 - 100% distal PDA --> medical mangement (no intervention done)  . Candida esophagitis ([place])   . Chronic combined systolic and diastolic CHF (congestive heart failure) ([place])   . Depression   . End stage renal disease on dialysis [facility])    T/ Th/ Sat starting 4/28- [facility]  . Essential hypertension   . GERD (gastroesophageal reflux disease)   . Gout   . Headache   . Hemorrhoids   . 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  Last was Prednisone '40mg'$ ; 2 wk in 10/2012.  She also was on Prednisone bridged with Cellcept briefly but stopped due to lack of insurance.   . Myocardial infarction ([place])   . Perinephric hematoma   . Pneumonia   . Pulmonary embolism ([place]) 04/2012  . Serrated adenoma of colon   . Steroid-induced diabetes ([place])   . Stroke ([place])   .  Thrush, oral 05/29/2011   Past Surgical History:  Procedure Laterality Date  . AV FISTULA PLACEMENT Right 01/23/2019   Procedure: ARTERIOVENOUS (AV) FISTULA CREATION RIGHT ARM;  Surgeon: Elam Dutch, MD;  Location: [facility];  Service: Vascular;  Laterality: Right;  . AV FISTULA PLACEMENT Right 01/30/2019   Procedure: GORETEX GRAFT PLACEMENT  RIGHT ARM;  Surgeon: Elam Dutch, MD;  Location: [facility];  Service:  Vascular;  Laterality: Right;  . BONE MARROW BIOPSY    . CARDIAC CATHETERIZATION    . CARPAL TUNNEL RELEASE    . CARPAL TUNNEL RELEASE Left 05/20/2016   Procedure: CARPAL TUNNEL RELEASE;  Surgeon: Carole Civil, MD;  Location: [facility];  Service: Orthopedics;  Laterality: Left;  . CARPAL TUNNEL RELEASE Right 07/16/2016   Procedure: RIGHT CARPAL TUNNEL RELEASE;  Surgeon: Carole Civil, MD;  Location: [facility];  Service: Orthopedics;  Laterality: Right;  . CATARACT EXTRACTION    . CHOLECYSTECTOMY  2008  . COLONOSCOPY WITH ESOPHAGOGASTRODUODENOSCOPY (EGD) N/A 01/04/2013   Dr. Gala Romney: esophageal plaques with +KOH, hh. Gastric antrum abnormal, bx reactive gastropathy. Anal canal hemorrhoids, colonic tics, normal TI, single polyp (sessile serrated tubular adenoma). Next TCS 12/2017  . IR FLUORO GUIDE CV LINE RIGHT  01/10/2019  . IR US GUIDE VASC ACCESS RIGHT  01/10/2019  . LEFT HEART CATH AND CORONARY ANGIOGRAPHY N/A 05/20/2017   Procedure: LEFT HEART CATH AND CORONARY ANGIOGRAPHY;  Surgeon: Martinique, Peter M, MD;  Location: [facility];  Service: Cardiovascular;  Laterality: N/A;  . 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 TEE WITHOUT CARDIOVERSION N/A 01/03/2016   Procedure: TRANSESOPHAGEAL ECHOCARDIOGRAM (TEE) WITH PROPOFOL;  Surgeon: Satira Sark, MD;  [facility];  Service: Cardiovascular;  Laterality: N/A;     Current Meds  Medication Sig  . acetaminophen (TYLENOL) 500 MG tablet Take 1,000 mg by mouth every 6 (six) hours as needed for moderate pain.  Marland Kitchen albuterol (PROVENTIL) (2.5 MG/3ML) 0.083% nebulizer solution Take 3 mLs (2.5 mg total) by nebulization every 4 (four) hours as needed for shortness of breath.  Marland Kitchen atorvastatin (LIPITOR) 40 MG tablet Take 1 tablet (40 mg total) by mouth daily at 6 PM.  . calcium acetate (PHOSLO) 667 MG capsule Take 2 capsules by mouth See admin instructions. Three times daily with meals and snacks  . diclofenac sodium (VOLTAREN) 1 % GEL Apply 2 g  topically 4 (four) times daily. (Patient taking differently: Apply 2 g topically daily as needed (JOINT PAIN). )  . hydrALAZINE (APRESOLINE) 50 MG tablet Take 50 mg by mouth 2 (two) times daily.  . isosorbide dinitrate (ISORDIL) 10 MG tablet Take 1 tablet (10 mg total) by mouth 2 (two) times daily.  Marland Kitchen lidocaine-prilocaine (EMLA) cream Apply 1 application topically as needed (numbing).   Marland Kitchen loratadine (CLARITIN) 10 MG tablet Take 10 mg by mouth daily as needed for allergies.  . metoprolol succinate (TOPROL-XL) 50 MG 24 hr tablet Take 50 mg by mouth daily. Take with or immediately following a meal.  . multivitamin (RENA-VIT) TABS tablet Take 1 tablet by mouth daily.  . nitroGLYCERIN (NITROSTAT) 0.4 MG SL tablet Place 1 tablet (0.4 mg total) under the tongue every 5 (five) minutes x 3 doses as needed for chest pain.  . Oxycodone HCl 10 MG TABS Take 10 mg by mouth every 4 (four) hours as needed for severe pain.  Marland Kitchen PROAIR HFA 108 (90 Base) MCG/ACT inhaler Inhale 2 puffs into the lungs every 4 (four) hours  as needed for shortness of breath.  . sertraline (ZOLOFT) 100 MG tablet Take 100 mg by mouth daily.  Marland Kitchen venlafaxine (EFFEXOR) 37.5 MG tablet Take 37.5 mg by mouth 2 (two) times daily.     Allergies:   Ciprofloxacin, Ceftriaxone, Dilaudid [hydromorphone hcl], Yellow jacket venom, Adhesive [tape], Brintellix [vortioxetine], Cymbalta [duloxetine hcl], and Penicillins   Social History   Tobacco Use  . Smoking status: Former Smoker    Years: 0.00    Types: Cigarettes    Quit date: 02/14/2009    Years since quitting: 10.4  . Smokeless tobacco: Current User    Types: Snuff  Substance Use Topics  . Alcohol use: No    Alcohol/week: 0.0 standard drinks  . Drug use: No     Family Hx: The patient's family history includes AAA (abdominal aortic aneurysm) in her paternal grandmother; Barrett's esophagus in her paternal aunt; Breast cancer in her paternal grandmother; Cervical cancer in her mother; Colon  cancer (age of onset: 54) in her paternal grandfather; Colon cancer (age of onset: 74) in her paternal grandmother; Colon polyps in her father; Lung cancer in her father; Pneumonia in her brother. There is no history of Stomach cancer or Pancreatic cancer.  ROS:   Please see the history of present illness. All other systems reviewed and are negative.   Prior CV studies:   The following studies were reviewed today:   Lexiscan Myoview 01/12/2019:  Defect 1: There is a small defect present in the apical anterior location.  Findings consistent with prior myocardial infarction.  This is an intermediate risk study.  The left ventricular ejection fraction is moderately decreased (30-44%).   There is a small area of irreversible defect in the apical anterior wall consistent with prior infarct and no ischemia. LVEF 37% with diffuse hypokinesis.   Echocardiogram 07/27/2019:  1. Left ventricular ejection fraction, by visual estimation, is 40%. The left ventricle has moderately decreased function. There is borderline left ventricular hypertrophy.  2. Left ventricular diastolic parameters are consistent with Grade I diastolic dysfunction (impaired relaxation).  3. Diffuse LV hypokinesis.  4. Global right ventricle has low normal systolic function.The right ventricular size is normal. No increase in right ventricular wall thickness.  5. Left atrial size was normal.  6. Right atrial size was normal.  7. The mitral valve is grossly normal. Moderate mitral valve regurgitation.  8. The tricuspid valve is grossly normal. Tricuspid valve regurgitation is trivial.  9. The aortic valve is tricuspid. Aortic valve regurgitation is not visualized. No evidence of aortic valve sclerosis or stenosis. 10. The pulmonic valve was grossly normal. Pulmonic valve regurgitation is not visualized. 11. Normal pulmonary artery systolic pressure. 12. The inferior vena cava is normal in size with greater than 50%  respiratory variability, suggesting right atrial pressure of 3 mmHg.  Labs/Other Tests and Data Reviewed:    EKG:  An ECG dated 01/12/2019 was personally reviewed today and demonstrated:  Sinus rhythm with left bundle branch block.  Recent Labs: 03/09/2019: ALT 14 03/13/2019: Hemoglobin 9.9; Platelets 174 03/14/2019: BUN 31; Creatinine, Ser 3.49; Magnesium 2.0; Potassium 3.5; Sodium 140   Recent Lipid Panel Lab Results  Component Value Date/Time   CHOL 229 (H) 05/19/2017 07:12 PM   TRIG 112 01/10/2019 02:34 AM   HDL 57 05/19/2017 07:12 PM   CHOLHDL 4.0 05/19/2017 07:12 PM   LDLCALC 149 (H) 05/19/2017 07:12 PM    Wt Readings from Last 3 Encounters:  07/28/19 185 lb 3 oz (84 kg)  07/18/19 179 lb 10.8 oz (81.5 kg)  07/06/19 181 lb (82.1 kg)     Objective:    Vital Signs:  BP 126/90   Pulse 74   Ht '5\' 5"'$  (1.651 m)   Wt 185 lb 3 oz (84 kg)   LMP 08/13/2011   BMI 30.82 kg/m    Patient spoke in full sentences, not short of breath. No audible wheezing or coughing. Speech pattern normal.  ASSESSMENT & PLAN:    1.  Nonischemic cardiomyopathy with LVEF approximately 40% by recent echocardiogram.  She has chronic combined heart failure with fluid management per hemodialysis in the setting of ESRD.  Plan to continue Toprol-XL, Isordil, and hydralazine, although some of these doses may need to be held or reduced on the mornings of hemodialysis to avoid hypotension.  Currently not on ARB/ANRI/Aldactone.  She does have a left bundle branch block by ECG.  Possibility of resynchronization therapy could be considered, although device implantation in the setting of ESRD does carry a higher risk of infection.  May eventually consider consultation with EP to at least review the issue.  2.  ESRD on hemodialysis.  3.  CAD with chronically occluded PDA being managed medically.  Myoview study from earlier in the year was low risk without active ischemic territories.  She remains on aspirin,  beta-blocker, and statin.  4.  Essential hypertension, no changes made to present regimen.  COVID-19 Education: The signs and symptoms of COVID-19 were discussed with the patient and how to seek care for testing (follow up with PCP or arrange E-visit).  The importance of social distancing was discussed today.  Time:   Today, I have spent 10 minutes with the patient with telehealth technology discussing the above problems.     Medication Adjustments/Labs and Tests Ordered: Current medicines are reviewed at length with the patient today.  Concerns regarding medicines are outlined above.   Tests Ordered: No orders of the defined types were placed in this encounter.   Medication Changes: No orders of the defined types were placed in this encounter.   Follow Up:  In Person 4 months in the [place] office with [place].  Signed, Rozann Lesches, MD  07/28/2019 8:41 AM    [place]

## 2019-07-27 NOTE — Patient Outreach (Signed)
[facility]) Care Management  07/27/2019  Summer Cook Dec 27, 1965 [id number]

## 2019-07-28 ENCOUNTER — Encounter: Payer: Self-pay | Admitting: Cardiology

## 2019-07-28 ENCOUNTER — Telehealth (INDEPENDENT_AMBULATORY_CARE_PROVIDER_SITE_OTHER): Payer: Medicare Other | Admitting: Cardiology

## 2019-07-28 VITALS — BP 126/90 | HR 74 | Ht 65.0 in | Wt 185.2 lb

## 2019-07-28 DIAGNOSIS — I2510 Atherosclerotic heart disease of native coronary artery without angina pectoris: Secondary | ICD-10-CM | POA: Diagnosis not present

## 2019-07-28 DIAGNOSIS — N186 End stage renal disease: Secondary | ICD-10-CM | POA: Diagnosis not present

## 2019-07-28 DIAGNOSIS — Z992 Dependence on renal dialysis: Secondary | ICD-10-CM | POA: Diagnosis not present

## 2019-07-28 DIAGNOSIS — I25119 Atherosclerotic heart disease of native coronary artery with unspecified angina pectoris: Secondary | ICD-10-CM

## 2019-07-28 DIAGNOSIS — I5042 Chronic combined systolic (congestive) and diastolic (congestive) heart failure: Secondary | ICD-10-CM

## 2019-07-28 DIAGNOSIS — I428 Other cardiomyopathies: Secondary | ICD-10-CM

## 2019-07-28 DIAGNOSIS — I10 Essential (primary) hypertension: Secondary | ICD-10-CM | POA: Diagnosis not present

## 2019-07-28 NOTE — Patient Instructions (Signed)
Medication Instructions:  Your physician recommends that you continue on your current medications as directed. Please refer to the Current Medication list given to you today.  *If you need a refill on your cardiac medications before your next appointment, please call your pharmacy*  Lab Work: None today If you have labs (blood work) drawn today and your tests are completely normal, you will receive your results only by: Marland Kitchen MyChart Message (if you have MyChart) OR . A paper copy in the mail If you have any lab test that is abnormal or we need to change your treatment, we will call you to review the results.  Testing/Procedures: None today  Follow-Up: At [facility], you and your health needs are our priority.  As part of our continuing mission to provide you with exceptional heart care, we have created designated Provider Care Teams.  These Care Teams include your primary Cardiologist (physician) and Advanced Practice Providers (APPs -  Physician Assistants and Nurse Practitioners) who all work together to provide you with the care you need, when you need it.  Your next appointment:   4 months  The format for your next appointment:   In Person  Provider:   Bernerd Pho, PA-C  Other Instructions None

## 2019-07-29 DIAGNOSIS — E8779 Other fluid overload: Secondary | ICD-10-CM | POA: Diagnosis not present

## 2019-07-29 DIAGNOSIS — Z992 Dependence on renal dialysis: Secondary | ICD-10-CM | POA: Diagnosis not present

## 2019-07-29 DIAGNOSIS — N186 End stage renal disease: Secondary | ICD-10-CM | POA: Diagnosis not present

## 2019-07-31 ENCOUNTER — Telehealth: Payer: Self-pay | Admitting: Licensed Clinical Social Worker

## 2019-07-31 NOTE — Telephone Encounter (Signed)
CSW referred to assist patient with obtaining a BP cuff. CSW contacted patient to inform cuff will be delivered to home. Patient grateful for support and assistance. CSW available as needed. Jackie Misa Fedorko, LCSW, CCSW-MCS [phone]  

## 2019-08-01 DIAGNOSIS — Z992 Dependence on renal dialysis: Secondary | ICD-10-CM | POA: Diagnosis not present

## 2019-08-01 DIAGNOSIS — N186 End stage renal disease: Secondary | ICD-10-CM | POA: Diagnosis not present

## 2019-08-02 DIAGNOSIS — N185 Chronic kidney disease, stage 5: Secondary | ICD-10-CM | POA: Diagnosis not present

## 2019-08-02 DIAGNOSIS — G894 Chronic pain syndrome: Secondary | ICD-10-CM | POA: Diagnosis not present

## 2019-08-02 DIAGNOSIS — Z992 Dependence on renal dialysis: Secondary | ICD-10-CM | POA: Diagnosis not present

## 2019-08-03 DIAGNOSIS — I2510 Atherosclerotic heart disease of native coronary artery without angina pectoris: Secondary | ICD-10-CM | POA: Diagnosis not present

## 2019-08-03 DIAGNOSIS — I10 Essential (primary) hypertension: Secondary | ICD-10-CM | POA: Diagnosis not present

## 2019-08-03 DIAGNOSIS — R519 Headache, unspecified: Secondary | ICD-10-CM | POA: Diagnosis not present

## 2019-08-03 DIAGNOSIS — Z79899 Other long term (current) drug therapy: Secondary | ICD-10-CM | POA: Diagnosis not present

## 2019-08-03 DIAGNOSIS — Z880 Allergy status to penicillin: Secondary | ICD-10-CM | POA: Diagnosis not present

## 2019-08-03 DIAGNOSIS — Z87891 Personal history of nicotine dependence: Secondary | ICD-10-CM | POA: Diagnosis not present

## 2019-08-04 ENCOUNTER — Other Ambulatory Visit (HOSPITAL_COMMUNITY)
Admission: RE | Admit: 2019-08-04 | Discharge: 2019-08-04 | Disposition: A | Discharge status: Home / Self Care | Payer: Medicare Other | Source: Ambulatory Visit | Attending: Vascular Surgery | Admitting: Vascular Surgery

## 2019-08-04 ENCOUNTER — Other Ambulatory Visit: Payer: Self-pay

## 2019-08-04 ENCOUNTER — Encounter (HOSPITAL_COMMUNITY): Payer: Self-pay | Admitting: *Deleted

## 2019-08-04 DIAGNOSIS — Z01812 Encounter for preprocedural laboratory examination: Secondary | ICD-10-CM | POA: Insufficient documentation

## 2019-08-04 DIAGNOSIS — N186 End stage renal disease: Secondary | ICD-10-CM | POA: Diagnosis not present

## 2019-08-04 DIAGNOSIS — Z20828 Contact with and (suspected) exposure to other viral communicable diseases: Secondary | ICD-10-CM | POA: Insufficient documentation

## 2019-08-04 DIAGNOSIS — Z992 Dependence on renal dialysis: Secondary | ICD-10-CM | POA: Diagnosis not present

## 2019-08-04 LAB — SARS CORONAVIRUS 2 (TAT 6-24 HRS): SARS Coronavirus 2: NEGATIVE

## 2019-08-05 DIAGNOSIS — Z992 Dependence on renal dialysis: Secondary | ICD-10-CM | POA: Diagnosis not present

## 2019-08-05 DIAGNOSIS — N186 End stage renal disease: Secondary | ICD-10-CM | POA: Diagnosis not present

## 2019-08-07 ENCOUNTER — Other Ambulatory Visit: Payer: Self-pay

## 2019-08-07 ENCOUNTER — Ambulatory Visit (HOSPITAL_COMMUNITY): Payer: Medicare Other | Admitting: Anesthesiology

## 2019-08-07 ENCOUNTER — Encounter (HOSPITAL_COMMUNITY): Payer: Self-pay

## 2019-08-07 ENCOUNTER — Encounter (HOSPITAL_COMMUNITY)
Admission: RE | Disposition: A | Discharge status: Home / Self Care | Payer: Self-pay | Source: Home / Self Care | Attending: Vascular Surgery

## 2019-08-07 ENCOUNTER — Ambulatory Visit (HOSPITAL_COMMUNITY)
Admission: RE | Admit: 2019-08-07 | Discharge: 2019-08-07 | Disposition: A | Discharge status: Home / Self Care | Payer: Medicare Other | Attending: Vascular Surgery | Admitting: Vascular Surgery

## 2019-08-07 DIAGNOSIS — Z881 Allergy status to other antibiotic agents status: Secondary | ICD-10-CM | POA: Insufficient documentation

## 2019-08-07 DIAGNOSIS — Z8673 Personal history of transient ischemic attack (TIA), and cerebral infarction without residual deficits: Secondary | ICD-10-CM | POA: Diagnosis not present

## 2019-08-07 DIAGNOSIS — N185 Chronic kidney disease, stage 5: Secondary | ICD-10-CM | POA: Diagnosis not present

## 2019-08-07 DIAGNOSIS — D693 Immune thrombocytopenic purpura: Secondary | ICD-10-CM | POA: Insufficient documentation

## 2019-08-07 DIAGNOSIS — M109 Gout, unspecified: Secondary | ICD-10-CM | POA: Diagnosis not present

## 2019-08-07 DIAGNOSIS — X58XXXA Exposure to other specified factors, initial encounter: Secondary | ICD-10-CM | POA: Diagnosis not present

## 2019-08-07 DIAGNOSIS — Z801 Family history of malignant neoplasm of trachea, bronchus and lung: Secondary | ICD-10-CM | POA: Insufficient documentation

## 2019-08-07 DIAGNOSIS — Z992 Dependence on renal dialysis: Secondary | ICD-10-CM | POA: Diagnosis not present

## 2019-08-07 DIAGNOSIS — D631 Anemia in chronic kidney disease: Secondary | ICD-10-CM | POA: Insufficient documentation

## 2019-08-07 DIAGNOSIS — Z9049 Acquired absence of other specified parts of digestive tract: Secondary | ICD-10-CM | POA: Diagnosis not present

## 2019-08-07 DIAGNOSIS — I340 Nonrheumatic mitral (valve) insufficiency: Secondary | ICD-10-CM | POA: Diagnosis not present

## 2019-08-07 DIAGNOSIS — J45909 Unspecified asthma, uncomplicated: Secondary | ICD-10-CM | POA: Diagnosis not present

## 2019-08-07 DIAGNOSIS — Z86711 Personal history of pulmonary embolism: Secondary | ICD-10-CM | POA: Insufficient documentation

## 2019-08-07 DIAGNOSIS — Z79899 Other long term (current) drug therapy: Secondary | ICD-10-CM | POA: Insufficient documentation

## 2019-08-07 DIAGNOSIS — Z8049 Family history of malignant neoplasm of other genital organs: Secondary | ICD-10-CM | POA: Insufficient documentation

## 2019-08-07 DIAGNOSIS — T82898A Other specified complication of vascular prosthetic devices, implants and grafts, initial encounter: Secondary | ICD-10-CM | POA: Insufficient documentation

## 2019-08-07 DIAGNOSIS — I25119 Atherosclerotic heart disease of native coronary artery with unspecified angina pectoris: Secondary | ICD-10-CM | POA: Diagnosis not present

## 2019-08-07 DIAGNOSIS — I252 Old myocardial infarction: Secondary | ICD-10-CM | POA: Diagnosis not present

## 2019-08-07 DIAGNOSIS — Z800 Family history of malignant neoplasm of digestive organs: Secondary | ICD-10-CM | POA: Insufficient documentation

## 2019-08-07 DIAGNOSIS — I5042 Chronic combined systolic (congestive) and diastolic (congestive) heart failure: Secondary | ICD-10-CM | POA: Diagnosis not present

## 2019-08-07 DIAGNOSIS — Z791 Long term (current) use of non-steroidal anti-inflammatories (NSAID): Secondary | ICD-10-CM | POA: Insufficient documentation

## 2019-08-07 DIAGNOSIS — K219 Gastro-esophageal reflux disease without esophagitis: Secondary | ICD-10-CM | POA: Diagnosis not present

## 2019-08-07 DIAGNOSIS — Z87891 Personal history of nicotine dependence: Secondary | ICD-10-CM | POA: Diagnosis not present

## 2019-08-07 DIAGNOSIS — Z888 Allergy status to other drugs, medicaments and biological substances status: Secondary | ICD-10-CM | POA: Insufficient documentation

## 2019-08-07 DIAGNOSIS — N186 End stage renal disease: Secondary | ICD-10-CM | POA: Diagnosis not present

## 2019-08-07 DIAGNOSIS — Z8379 Family history of other diseases of the digestive system: Secondary | ICD-10-CM | POA: Insufficient documentation

## 2019-08-07 DIAGNOSIS — I953 Hypotension of hemodialysis: Secondary | ICD-10-CM | POA: Insufficient documentation

## 2019-08-07 DIAGNOSIS — F329 Major depressive disorder, single episode, unspecified: Secondary | ICD-10-CM | POA: Insufficient documentation

## 2019-08-07 DIAGNOSIS — E1122 Type 2 diabetes mellitus with diabetic chronic kidney disease: Secondary | ICD-10-CM | POA: Insufficient documentation

## 2019-08-07 DIAGNOSIS — I132 Hypertensive heart and chronic kidney disease with heart failure and with stage 5 chronic kidney disease, or end stage renal disease: Secondary | ICD-10-CM | POA: Insufficient documentation

## 2019-08-07 DIAGNOSIS — M1990 Unspecified osteoarthritis, unspecified site: Secondary | ICD-10-CM | POA: Insufficient documentation

## 2019-08-07 DIAGNOSIS — Z836 Family history of other diseases of the respiratory system: Secondary | ICD-10-CM | POA: Insufficient documentation

## 2019-08-07 DIAGNOSIS — I5023 Acute on chronic systolic (congestive) heart failure: Secondary | ICD-10-CM | POA: Diagnosis not present

## 2019-08-07 DIAGNOSIS — Z885 Allergy status to narcotic agent status: Secondary | ICD-10-CM | POA: Insufficient documentation

## 2019-08-07 DIAGNOSIS — Z8249 Family history of ischemic heart disease and other diseases of the circulatory system: Secondary | ICD-10-CM | POA: Insufficient documentation

## 2019-08-07 DIAGNOSIS — F419 Anxiety disorder, unspecified: Secondary | ICD-10-CM | POA: Insufficient documentation

## 2019-08-07 DIAGNOSIS — Z803 Family history of malignant neoplasm of breast: Secondary | ICD-10-CM | POA: Insufficient documentation

## 2019-08-07 DIAGNOSIS — Z880 Allergy status to penicillin: Secondary | ICD-10-CM | POA: Insufficient documentation

## 2019-08-07 DIAGNOSIS — Z9081 Acquired absence of spleen: Secondary | ICD-10-CM | POA: Insufficient documentation

## 2019-08-07 DIAGNOSIS — Z9849 Cataract extraction status, unspecified eye: Secondary | ICD-10-CM | POA: Insufficient documentation

## 2019-08-07 HISTORY — DX: Personal history of urinary calculi: Z87.442

## 2019-08-07 HISTORY — PX: AV FISTULA PLACEMENT: SHX1204

## 2019-08-07 LAB — POCT I-STAT, CHEM 8
BUN: 41 mg/dL — ABNORMAL HIGH (ref 6–20)
Calcium, Ion: 1.03 mmol/L — ABNORMAL LOW (ref 1.15–1.40)
Chloride: 102 mmol/L (ref 98–111)
Creatinine, Ser: 4.9 mg/dL — ABNORMAL HIGH (ref 0.44–1.00)
Glucose, Bld: 81 mg/dL (ref 70–99)
HCT: 44 % (ref 36.0–46.0)
Hemoglobin: 15 g/dL (ref 12.0–15.0)
Potassium: 4.4 mmol/L (ref 3.5–5.1)
Sodium: 134 mmol/L — ABNORMAL LOW (ref 135–145)
TCO2: 21 mmol/L — ABNORMAL LOW (ref 22–32)

## 2019-08-07 SURGERY — ARTERIOVENOUS (AV) FISTULA CREATION
Anesthesia: Monitor Anesthesia Care | Site: Arm Upper | Laterality: Left

## 2019-08-07 MED ORDER — PHENYLEPHRINE HCL (PRESSORS) 10 MG/ML IV SOLN
INTRAVENOUS | Status: DC | PRN
  Administered 2019-08-07: 80 ug via INTRAVENOUS

## 2019-08-07 MED ORDER — PROPOFOL 500 MG/50ML IV EMUL
INTRAVENOUS | Status: DC | PRN
  Administered 2019-08-07: 75 ug/kg/min via INTRAVENOUS

## 2019-08-07 MED ORDER — HEPARIN SODIUM (PORCINE) 1000 UNIT/ML IJ SOLN
INTRAMUSCULAR | Status: AC
  Filled 2019-08-07: qty 1

## 2019-08-07 MED ORDER — SODIUM CHLORIDE 0.9 % IV SOLN
INTRAVENOUS | Status: AC
  Filled 2019-08-07: qty 1.2

## 2019-08-07 MED ORDER — MIDAZOLAM HCL 5 MG/5ML IJ SOLN
INTRAMUSCULAR | Status: DC | PRN
  Administered 2019-08-07: 2 mg via INTRAVENOUS

## 2019-08-07 MED ORDER — HEPARIN SODIUM (PORCINE) 1000 UNIT/ML IJ SOLN
INTRAMUSCULAR | Status: DC | PRN
  Administered 2019-08-07: 5000 [IU] via INTRAVENOUS

## 2019-08-07 MED ORDER — CHLORHEXIDINE GLUCONATE 4 % EX LIQD: 60.0000 mL | Freq: Once | CUTANEOUS | Status: DC

## 2019-08-07 MED ORDER — OXYCODONE HCL 5 MG/5ML PO SOLN: 5.0000 mg | Freq: Once | ORAL | Status: DC | PRN

## 2019-08-07 MED ORDER — ACETAMINOPHEN 160 MG/5ML PO SOLN: 1000.0000 mg | Freq: Once | ORAL | Status: DC | PRN

## 2019-08-07 MED ORDER — PROPOFOL 10 MG/ML IV BOLUS
INTRAVENOUS | Status: DC | PRN
  Administered 2019-08-07: 40 mg via INTRAVENOUS
  Administered 2019-08-07: 20 mg via INTRAVENOUS

## 2019-08-07 MED ORDER — OXYCODONE HCL 5 MG PO TABS: 5.0000 mg | ORAL_TABLET | Freq: Once | ORAL | Status: DC | PRN

## 2019-08-07 MED ORDER — VANCOMYCIN HCL IN DEXTROSE 1-5 GM/200ML-% IV SOLN
1000.0000 mg | INTRAVENOUS | Status: AC
  Administered 2019-08-07: 09:00:00 1000 mg via INTRAVENOUS

## 2019-08-07 MED ORDER — ACETAMINOPHEN 500 MG PO TABS: 1000.0000 mg | ORAL_TABLET | Freq: Once | ORAL | Status: DC | PRN

## 2019-08-07 MED ORDER — LIDOCAINE HCL (PF) 1 % IJ SOLN
INTRAMUSCULAR | Status: DC | PRN
  Administered 2019-08-07: 10 mL

## 2019-08-07 MED ORDER — SODIUM CHLORIDE 0.9 % IV SOLN
INTRAVENOUS | Status: DC
  Administered 2019-08-07: 09:00:00 via INTRAVENOUS

## 2019-08-07 MED ORDER — MIDAZOLAM HCL 2 MG/2ML IJ SOLN
INTRAMUSCULAR | Status: AC
  Filled 2019-08-07: qty 2

## 2019-08-07 MED ORDER — FENTANYL CITRATE (PF) 100 MCG/2ML IJ SOLN
INTRAMUSCULAR | Status: AC
  Filled 2019-08-07: qty 2

## 2019-08-07 MED ORDER — 0.9 % SODIUM CHLORIDE (POUR BTL) OPTIME
TOPICAL | Status: DC | PRN
  Administered 2019-08-07: 11:00:00 1000 mL

## 2019-08-07 MED ORDER — FENTANYL CITRATE (PF) 100 MCG/2ML IJ SOLN: 25.0000 ug | INTRAMUSCULAR | Status: DC | PRN

## 2019-08-07 MED ORDER — ACETAMINOPHEN 10 MG/ML IV SOLN: 1000.0000 mg | Freq: Once | INTRAVENOUS | Status: DC | PRN

## 2019-08-07 MED ORDER — OXYCODONE HCL 10 MG PO TABS: 10.0000 mg | ORAL_TABLET | Freq: Four times a day (QID) | ORAL | 0 refills | Status: AC | PRN

## 2019-08-07 MED ORDER — LIDOCAINE HCL (PF) 1 % IJ SOLN
INTRAMUSCULAR | Status: AC
  Filled 2019-08-07: qty 30

## 2019-08-07 MED ORDER — SODIUM CHLORIDE 0.9 % IV SOLN
INTRAVENOUS | Status: DC | PRN
  Administered 2019-08-07: 11:00:00

## 2019-08-07 MED ORDER — VANCOMYCIN HCL IN DEXTROSE 1-5 GM/200ML-% IV SOLN
INTRAVENOUS | Status: AC
  Administered 2019-08-07: 1000 mg via INTRAVENOUS
  Filled 2019-08-07: qty 200

## 2019-08-07 MED ORDER — FENTANYL CITRATE (PF) 100 MCG/2ML IJ SOLN
INTRAMUSCULAR | Status: DC | PRN
  Administered 2019-08-07 (×4): 25 ug via INTRAVENOUS

## 2019-08-07 SURGICAL SUPPLY — 33 items
ADH SKN CLS APL DERMABOND .7 (GAUZE/BANDAGES/DRESSINGS) ×1
ARMBAND PINK RESTRICT EXTREMIT (MISCELLANEOUS) ×4 IMPLANT
CANISTER SUCT 3000ML PPV (MISCELLANEOUS) ×2 IMPLANT
CANNULA VESSEL 3MM 2 BLNT TIP (CANNULA) ×2 IMPLANT
CLIP VESOCCLUDE MED 6/CT (CLIP) ×2 IMPLANT
CLIP VESOCCLUDE SM WIDE 6/CT (CLIP) ×2 IMPLANT
COVER PROBE W GEL 5X96 (DRAPES) ×1 IMPLANT
COVER WAND RF STERILE (DRAPES) ×2 IMPLANT
DECANTER SPIKE VIAL GLASS SM (MISCELLANEOUS) ×2 IMPLANT
DERMABOND ADVANCED (GAUZE/BANDAGES/DRESSINGS) ×1
DERMABOND ADVANCED .7 DNX12 (GAUZE/BANDAGES/DRESSINGS) ×1 IMPLANT
DRAIN PENROSE 1/4X12 LTX STRL (WOUND CARE) ×2 IMPLANT
ELECT REM PT RETURN 9FT ADLT (ELECTROSURGICAL) ×2
ELECTRODE REM PT RTRN 9FT ADLT (ELECTROSURGICAL) ×1 IMPLANT
GLOVE BIO SURGEON STRL SZ7.5 (GLOVE) ×2 IMPLANT
GOWN STRL REUS W/ TWL LRG LVL3 (GOWN DISPOSABLE) ×3 IMPLANT
GOWN STRL REUS W/TWL LRG LVL3 (GOWN DISPOSABLE) ×6
KIT BASIN OR (CUSTOM PROCEDURE TRAY) ×2 IMPLANT
KIT TURNOVER KIT B (KITS) ×2 IMPLANT
LOOP VESSEL MINI RED (MISCELLANEOUS) IMPLANT
NS IRRIG 1000ML POUR BTL (IV SOLUTION) ×2 IMPLANT
PACK CV ACCESS (CUSTOM PROCEDURE TRAY) ×2 IMPLANT
PAD ARMBOARD 7.5X6 YLW CONV (MISCELLANEOUS) ×4 IMPLANT
SPONGE SURGIFOAM ABS GEL 100 (HEMOSTASIS) IMPLANT
SUT PROLENE 6 0 BV (SUTURE) ×2 IMPLANT
SUT PROLENE 6 0 CC (SUTURE) ×1 IMPLANT
SUT PROLENE 7 0 BV 1 (SUTURE) ×2 IMPLANT
SUT VIC AB 3-0 SH 27 (SUTURE) ×2
SUT VIC AB 3-0 SH 27X BRD (SUTURE) ×1 IMPLANT
SUT VICRYL 4-0 PS2 18IN ABS (SUTURE) ×2 IMPLANT
TOWEL GREEN STERILE (TOWEL DISPOSABLE) ×2 IMPLANT
UNDERPAD 30X30 (UNDERPADS AND DIAPERS) ×2 IMPLANT
WATER STERILE IRR 1000ML POUR (IV SOLUTION) ×2 IMPLANT

## 2019-08-07 NOTE — Op Note (Signed)
Procedure: Left Brachial Cephalic AV fistula  Preop: ESRD  Postop: ESRD  Anesthesia: Local with sedation  Assistant: Arlee Muslim  Findings: 3 mm cephalic vein accept a 5 mm dilator  Procedure: After obtaining informed consent, the patient was taken to the operating room.  After adequate sedation, the left upper extremity was prepped and draped in usual sterile fashion.  [place] was used to evaluate the cephalic vein.  It was about 2.5 to 3 mm diameter.  A transverse incision was then made near the antecubital crease the left arm. The incision was carried into the subcutaneous tissues down to level of the cephalic vein. The cephalic vein was approximately 3.5 mm in diameter. It was of good quality. This was dissected free circumferentially and small side branches ligated and divided between silk ties or clips. Next the brachial artery was dissected free in the medial portion of the incision. The artery was  3 mm in diameter. The vessel loops were placed proximal and distal to the planned site of arteriotomy. The patient was given 5000 units of intravenous heparin. After appropriate circulation time, the vessel loops were used to control the artery. A longitudinal opening was made in the brachial artery.  The vein was ligated distally with a 2-0 silk tie. The vein was controlled proximally with a fine bulldog clamp. It accepted up to a 5 mm dilator.  The vein was then swung over to the artery and sewn end of vein to side of artery using a running 7-0 Prolene suture. Just prior to completion of the anastomosis, everything was fore bled back bled and thoroughly flushed. The anastomosis was secured, vessel loops released, and there was a weakly palpable thrill in the fistula immediately with good doppler flow to the shoulder level. After hemostasis was obtained, the subcutaneous tissues were reapproximated using a running 3-0 Vicryl suture. The skin was then closed with a 4 0 Vicryl subcuticular stitch.  Dermabond was applied to the skin incision.  The patient had a palpable radial pulse at the end of the case.  Ruta Hinds, MD [facility] [place] Office: [phone] Pager: [phone]

## 2019-08-07 NOTE — Anesthesia Procedure Notes (Signed)
Procedure Name: MAC Date/Time: 08/07/2019 11:10 AM Performed by: Cleda Daub, CRNA Pre-anesthesia Checklist: Patient identified, Emergency Drugs available, Suction available and Patient being monitored Patient Re-evaluated:Patient Re-evaluated prior to induction Oxygen Delivery Method: Simple face mask Preoxygenation: Pre-oxygenation with 100% oxygen Induction Type: IV induction Placement Confirmation: positive ETCO2 and breath sounds checked- equal and bilateral Dental Injury: Teeth and Oropharynx as per pre-operative assessment

## 2019-08-07 NOTE — Anesthesia Preprocedure Evaluation (Signed)
Anesthesia Evaluation  Patient identified by MRN, date of birth, ID band Patient awake    Reviewed: Allergy & Precautions, NPO status , Patient's Chart, lab work & pertinent test results  History of Anesthesia Complications Negative for: history of anesthetic complications  Airway Mallampati: II  TM Distance: >3 FB Neck ROM: Full    Dental  (+) Dental Advisory Given, Teeth Intact   Pulmonary asthma , former smoker,    breath sounds clear to auscultation       Cardiovascular hypertension, Pt. on medications and Pt. on home beta blockers + angina + CAD, + Past MI and +CHF   Rhythm:Regular     Neuro/Psych  Headaches, PSYCHIATRIC DISORDERS Anxiety Depression CVA    GI/Hepatic Neg liver ROS, GERD  ,  Endo/Other  diabetes  Renal/GU ESRF and DialysisRenal disease     Musculoskeletal  (+) Arthritis ,   Abdominal   Peds  Hematology  (+) anemia ,   Anesthesia Other Findings  1. Left ventricular ejection fraction, by visual estimation, is 40%. The left ventricle has moderately decreased function. There is borderline left ventricular hypertrophy.  2. Left ventricular diastolic parameters are consistent with Grade I diastolic dysfunction (impaired relaxation).  3. Diffuse LV hypokinesis.  4. Global right ventricle has low normal systolic function.The right ventricular size is normal. No increase in right ventricular wall thickness.  5. Left atrial size was normal.  6. Right atrial size was normal.  7. The mitral valve is grossly normal. Moderate mitral valve regurgitation.  8. The tricuspid valve is grossly normal. Tricuspid valve regurgitation is trivial.  9. The aortic valve is tricuspid. Aortic valve regurgitation is not visualized. No evidence of aortic valve sclerosis or stenosis. 10. The pulmonic valve was grossly normal. Pulmonic valve regurgitation is not visualized. 11. Normal pulmonary artery systolic pressure. 12.  The inferior vena cava is normal in size with greater than 50% respiratory variability, suggesting right atrial pressure of 3 mmHg.  Reproductive/Obstetrics                             Anesthesia Physical Anesthesia Plan  ASA: III  Anesthesia Plan: MAC   Post-op Pain Management:    Induction: Intravenous  PONV Risk Score and Plan: 2 and Treatment may vary due to age or medical condition and Propofol infusion  Airway Management Planned:   Additional Equipment: None  Intra-op Plan:   Post-operative Plan:   Informed Consent: I have reviewed the patients History and Physical, chart, labs and discussed the procedure including the risks, benefits and alternatives for the proposed anesthesia with the patient or authorized representative who has indicated his/her understanding and acceptance.     Dental advisory given  Plan Discussed with: CRNA and Surgeon  Anesthesia Plan Comments:         Anesthesia Quick Evaluation

## 2019-08-07 NOTE — H&P (Signed)
Referring Physician: Dr Lenora Boys  Patient name: Summer Cook            MRN: [id number]        DOB: 10-16-1965          Sex: female  REASON FOR CONSULT: Hemodialysis access  HPI: Summer Cook is a 53 y.o. female, end-stage renal disease currently dialyzing via left sided catheter.  She had a previous right upper arm AV graft which failed after about 3 months despite 1 angioplasty and stenting procedure.  She states that she also had several episodes of hypotension while on dialysis.  Her current dialysis today is Monday Wednesday Friday.  She is right-handed.  Other medical problems include anxiety, asthma, coronary artery disease, congestive heart failure, reflux all of which are currently stable.      Past Medical History:  Diagnosis Date  . Allergy   . Anxiety   . Asthma   . CAD (coronary artery disease), native coronary artery    a. cath 05/20/17 - 100% distal PDA --> medical mangement (no intervention done)  . Candida esophagitis ([place])   . Chronic combined systolic and diastolic CHF (congestive heart failure) ([place])   . Depression   . End stage renal disease on dialysis [facility])    T/ Th/ Sat starting 4/28- [facility]  . Essential hypertension, benign   . GERD (gastroesophageal reflux disease)   . Gout   . Headache   . Hemorrhoids   . 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  Last was Prednisone '40mg'$ ; 2 wk in 10/2012.  She also was on Prednisone bridged with Cellcept briefly but stopped due to lack of insurance.   . Myocardial infarction ([place])   . Perinephric hematoma   . Pneumonia   . Pulmonary embolism ([place]) 04/2012  . Serrated adenoma of colon   . Steroid-induced diabetes ([place])   . Stroke ([place])   . Thrush, oral 05/29/2011        Past Surgical History:  Procedure Laterality Date  . AV FISTULA PLACEMENT Right 01/23/2019    Procedure: ARTERIOVENOUS (AV) FISTULA CREATION RIGHT ARM;  Surgeon: Elam Dutch, MD;  Location: [facility];  Service: Vascular;  Laterality: Right;  . AV FISTULA PLACEMENT Right 01/30/2019   Procedure: GORETEX GRAFT PLACEMENT  RIGHT ARM;  Surgeon: Elam Dutch, MD;  Location: [facility];  Service: Vascular;  Laterality: Right;  . BONE MARROW BIOPSY    . CARDIAC CATHETERIZATION    . CARPAL TUNNEL RELEASE    . CARPAL TUNNEL RELEASE Left 05/20/2016   Procedure: CARPAL TUNNEL RELEASE;  Surgeon: Carole Civil, MD;  Location: [facility];  Service: Orthopedics;  Laterality: Left;  . CARPAL TUNNEL RELEASE Right 07/16/2016   Procedure: RIGHT CARPAL TUNNEL RELEASE;  Surgeon: Carole Civil, MD;  Location: [facility];  Service: Orthopedics;  Laterality: Right;  . CATARACT EXTRACTION    . CHOLECYSTECTOMY  2008  . COLONOSCOPY WITH ESOPHAGOGASTRODUODENOSCOPY (EGD) N/A 01/04/2013   Dr. Gala Romney: esophageal plaques with +KOH, hh. Gastric antrum abnormal, bx reactive gastropathy. Anal canal hemorrhoids, colonic tics, normal TI, single polyp (sessile serrated tubular adenoma). Next TCS 12/2017  . IR FLUORO GUIDE CV LINE RIGHT  01/10/2019  . IR US GUIDE VASC ACCESS RIGHT  01/10/2019  . LEFT HEART CATH AND CORONARY ANGIOGRAPHY N/A 05/20/2017   Procedure: LEFT HEART CATH AND CORONARY ANGIOGRAPHY;  Surgeon: Martinique, Peter M, MD;  Location:  [place] [facility];  Service: Cardiovascular;  Laterality: N/A;  . 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 TEE WITHOUT CARDIOVERSION N/A 01/03/2016   Procedure: TRANSESOPHAGEAL ECHOCARDIOGRAM (TEE) WITH PROPOFOL;  Surgeon: Satira Sark, MD;  [facility];  Service: Cardiovascular;  Laterality: N/A;         Family History  Problem Relation Age of Onset  . Cervical cancer Mother   . Lung cancer Father   . Colon polyps Father        ???  . Pneumonia Brother   . Colon cancer Paternal Grandmother 88  . AAA (abdominal aortic aneurysm) Paternal Grandmother    . Breast cancer Paternal Grandmother   . Colon cancer Paternal Grandfather 64  . Barrett's esophagus Paternal Aunt   . Stomach cancer Neg Hx   . Pancreatic cancer Neg Hx     SOCIAL HISTORY: Social History        Socioeconomic History  . Marital status: Divorced    Spouse name: Not on file  . Number of children: 1  . Years of education: Not on file  . 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   Social Needs  . Financial resource strain: Not hard at all  . Food insecurity    Worry: Sometimes true    Inability: Sometimes true  . Transportation needs    Medical: No    Non-medical: No  Tobacco Use  . Smoking status: Former Smoker    Years: 0.00    Types: Cigarettes    Quit date: 02/14/2009    Years since quitting: 10.3  . Smokeless tobacco: Current User    Types: Snuff  . Tobacco comment:  snus pouch   Substance and Sexual Activity  . Alcohol use: No    Alcohol/week: 0.0 standard drinks  . Drug use: No  . Sexual activity: Never  Lifestyle  . Physical activity    Days per week: Patient refused    Minutes per session: Patient refused  . Stress: Rather much  Relationships  . Social connections    Talks on phone: More than three times a week    Gets together: More than three times a week    Attends religious service: Never    Active member of club or organization: No    Attends meetings of clubs or organizations: Never    Relationship status: Divorced  . Intimate partner violence    Fear of current or ex partner: No    Emotionally abused: No    Physically abused: No    Forced sexual activity: No  Other Topics Concern  . Not on file  Social History Narrative   Lives with her daughter and aunt in a one story home.  Has 1 daughter.  On disability.  Did work as a Optometrist and bus Geophysicist/field seismologist.  Education: some college.           Allergies  Allergen Reactions  . Ciprofloxacin Nausea And Vomiting  . Ceftriaxone Rash  . Dilaudid [Hydromorphone Hcl] Anxiety and Other (See Comments)    Causes hallucinations, anxiety, and panic per patient  . Yellow Jacket Venom Swelling    Swelling at injection site  . Adhesive [Tape] Rash    Please use paper tape  . Brintellix [Vortioxetine] Itching  . Cymbalta [Duloxetine Hcl] Itching  . Penicillins Itching    Did it involve swelling of the face/tongue/throat,  SOB, or low BP? No Did it involve sudden or severe rash/hives, skin peeling, or any reaction on the inside of your mouth or nose? No Did you need to seek medical attention at a hospital or doctor's office? No When did it last happen?53 yrs old If all above answers are "NO", may proceed with cephalosporin use.          Current Outpatient Medications  Medication Sig Dispense Refill  . acetaminophen (TYLENOL) 500 MG tablet Take 1,000 mg by mouth every 6 (six) hours as needed for moderate pain.    Marland Kitchen albuterol (PROVENTIL) (2.5 MG/3ML) 0.083% nebulizer solution Take 3 mLs (2.5 mg total) by nebulization every 4 (four) hours as needed for shortness of breath. 75 mL 12  . atorvastatin (LIPITOR) 40 MG tablet Take 1 tablet (40 mg total) by mouth daily at 6 PM. 30 tablet 3  . dexamethasone (DECADRON) 2 MG tablet Take 2 mg by mouth 2 (two) times daily with a meal.    . diclofenac sodium (VOLTAREN) 1 % GEL Apply 2 g topically 4 (four) times daily.    Marland Kitchen doxycycline (VIBRAMYCIN) 100 MG capsule Take 100 mg by mouth 2 (two) times daily. For 10 days    . isosorbide dinitrate (ISORDIL) 10 MG tablet Take 1 tablet (10 mg total) by mouth 2 (two) times daily. 180 tablet 3  . lidocaine-prilocaine (EMLA) cream Apply 1 application topically as needed.    . loratadine (CLARITIN) 10 MG tablet Take 10 mg by mouth daily as needed for allergies.    . metoprolol succinate (TOPROL-XL) 50 MG 24 hr tablet Take 50 mg  by mouth daily. Take with or immediately following a meal.    . nitroGLYCERIN (NITROSTAT) 0.4 MG SL tablet Place 1 tablet (0.4 mg total) under the tongue every 5 (five) minutes x 3 doses as needed for chest pain. 25 tablet 12  . sertraline (ZOLOFT) 100 MG tablet Take 100 mg by mouth daily.    Marland Kitchen venlafaxine (EFFEXOR) 37.5 MG tablet Take 37.5 mg by mouth 2 (two) times daily.    . hydrALAZINE (APRESOLINE) 50 MG tablet Take 1 tablet (50 mg total) by mouth 3 (three) times daily. (Patient taking differently: Take 50 mg by mouth 2 (two) times a day. ) 270 tablet 3   No current facility-administered medications for this visit.     ROS:   General:  No weight loss, Fever, chills  HEENT: No recent headaches, no nasal bleeding, no visual changes, no sore throat  Neurologic: No dizziness, blackouts, seizures. No recent symptoms of stroke or mini- stroke. No recent episodes of slurred speech, or temporary blindness.  Cardiac: No recent episodes of chest pain/pressure, no shortness of breath at rest.  No shortness of breath with exertion.  Denies history of atrial fibrillation or irregular heartbeat  Vascular: No history of rest pain in feet.  No history of claudication.  No history of non-healing ulcer, No history of DVT   Pulmonary: No home oxygen, no productive cough, no hemoptysis,  No asthma or wheezing  Musculoskeletal:  '[ ]'$  Arthritis, '[ ]'$  Low back pain,  '[ ]'$  Joint pain  Hematologic:No history of hypercoagulable state.  No history of easy bleeding.  + history of anemia  Gastrointestinal: No hematochezia or melena,  No gastroesophageal reflux, no trouble swallowing  Urinary: '[X]'$  chronic Kidney disease, '[X]'$  on HD - '[X]'$  MWF or '[ ]'$  TTHS, '[ ]'$  Burning with urination, '[ ]'$  Frequent urination, '[ ]'$  Difficulty urinating;   Skin:  No rashes  Psychological: No history of anxiety,  No history of depression   Physical Examination  Vitals:   08/04/19 1823 08/07/19 0850  BP:   123/79  Pulse:  70  Resp:  20  Temp:  97.8 F (36.6 C)  TempSrc:  Oral  SpO2:  97%  Weight: 81.5 kg 81.5 kg  Height: '5\' 5"'$  (1.651 m) '5\' 5"'$  (1.651 m)    General:  Alert and oriented, no acute distress HEENT: Normal Neck: No JVD Cardiac: Regular Rate and Rhythm Skin: No rash Extremity Pulses:  2+ radial, brachial pulses bilaterally, occluded right upper arm AV graft Musculoskeletal: No deformity or edema      Neurologic: Upper and lower extremity motor 5/5 and symmetric  DATA:  Patient had a vein mapping ultrasound today.  I reviewed and interpreted the study.  Right basilic vein was 3 to 4 mm.  Right cephalic vein was 3 mm at the wrist but occluded in the upper arm.  Left cephalic vein was 3 mm from the antecubital him up it was 2-1/2 mm in the forearm.  Left basilic vein was 4 to 5 mm.  She had a normal upper extremity arterial duplex.  ASSESSMENT: Patient with early failure of right upper arm AV graft.  She seems to have a reasonable cephalic vein on the left side.  Duplex in the vascular lab suggested the vein was greater than 3 mm.  On my exam with the SonoSite at the bedside it was about 2-1/2 to 3 mm in diameter.   PLAN: Left brachiocephalic AV fistula scheduled for placement on July 18, 2019.  Risk benefits possible complications and procedure details including not limited to bleeding infection ischemic steal non-maturation of the fistula fistula thrombosis need for other further procedures were discussed with patient today.  She understands and agrees to proceed.   Ruta Hinds, MD [facility] Office: [phone] Pager: [phone]

## 2019-08-07 NOTE — Discharge Instructions (Signed)
° °  [facility] ° °Discharge Instructions ° °AV Fistula or Graft Surgery for Dialysis Access ° °Please refer to the following instructions for your post-procedure care. Your surgeon or physician assistant will discuss any changes with you. ° °Activity ° °You may drive the day following your surgery, if you are comfortable and no longer taking prescription pain medication. Resume full activity as the soreness in your incision resolves. ° °Bathing/Showering ° °You may shower after you go home. Keep your incision dry for 48 hours. Do not soak in a bathtub, hot tub, or swim until the incision heals completely. You may not shower if you have a hemodialysis catheter. ° °Incision Care ° °Clean your incision with mild soap and water after 48 hours. Pat the area dry with a clean towel. You do not need a bandage unless otherwise instructed. Do not apply any ointments or creams to your incision. You may have skin glue on your incision. Do not peel it off. It will come off on its own in about one week. Your arm may swell a bit after surgery. To reduce swelling use pillows to elevate your arm so it is above your heart. Your doctor will tell you if you need to lightly wrap your arm with an ACE bandage. ° °Diet ° °Resume your normal diet. There are not special food restrictions following this procedure. In order to heal from your surgery, it is CRITICAL to get adequate nutrition. Your body requires vitamins, minerals, and protein. Vegetables are the best source of vitamins and minerals. Vegetables also provide the perfect balance of protein. Processed food has little nutritional value, so try to avoid this. ° °Medications ° °Resume taking all of your medications. If your incision is causing pain, you may take over-the counter pain relievers such as acetaminophen (Tylenol). If you were prescribed a stronger pain medication, please be aware these medications can cause nausea and constipation. Prevent  nausea by taking the medication with a snack or meal. Avoid constipation by drinking plenty of fluids and eating foods with high amount of fiber, such as fruits, vegetables, and grains. Do not take Tylenol if you are taking prescription pain medications. ° ° ° ° °Follow up °Your surgeon may want to see you in the office following your access surgery. If so, this will be arranged at the time of your surgery. ° °Please call us immediately for any of the following conditions: ° °Increased pain, redness, drainage (pus) from your incision site °Fever of 101 degrees or higher °Severe or worsening pain at your incision site °Hand pain or numbness. ° °Reduce your risk of vascular disease: ° °Stop smoking. If you would like help, call [facility] at 1-800-QUIT-NOW (1-800-784-8669) or [place] at 336-586-4000 ° °Manage your cholesterol °Maintain a desired weight °Control your diabetes °Keep your blood pressure down ° °Dialysis ° °It will take several weeks to several months for your new dialysis access to be ready for use. Your surgeon will determine when it is OK to use it. Your nephrologist will continue to direct your dialysis. You can continue to use your Permcath until your new access is ready for use. ° °If you have any questions, please call the office at [phone]. ° °

## 2019-08-07 NOTE — Transfer of Care (Signed)
Immediate Anesthesia Transfer of Care Note  Patient: Summer Cook  Procedure(s) Performed: LEFT BRACHIOCEPHALIC VENOUS FISTULA (Left Arm Upper)  Patient Location: PACU  Anesthesia Type:MAC  Level of Consciousness: awake, alert , oriented and patient cooperative  Airway & Oxygen Therapy: Patient Spontanous Breathing and Patient connected to face mask oxygen  Post-op Assessment: Report given to RN and Post -op Vital signs reviewed and stable  Post vital signs: Reviewed and stable  Last Vitals:  Vitals Value Taken Time  BP 153/84 08/07/19 1205  Temp 36.4 C 08/07/19 1205  Pulse 62 08/07/19 1206  Resp 17 08/07/19 1206  SpO2 99 % 08/07/19 1206  Vitals shown include unvalidated device data.  Last Pain:  Vitals:   08/07/19 0850  TempSrc: Oral  PainSc: 0-No pain         Complications: No apparent anesthesia complications

## 2019-08-08 ENCOUNTER — Encounter (HOSPITAL_COMMUNITY): Payer: Self-pay | Admitting: Vascular Surgery

## 2019-08-08 NOTE — Anesthesia Postprocedure Evaluation (Signed)
Anesthesia Post Note  Patient: Summer Cook  Procedure(s) Performed: LEFT BRACHIOCEPHALIC VENOUS FISTULA (Left Arm Upper)     Patient location during evaluation: PACU Anesthesia Type: MAC Level of consciousness: awake and alert Pain management: pain level controlled Vital Signs Assessment: post-procedure vital signs reviewed and stable Respiratory status: spontaneous breathing, nonlabored ventilation, respiratory function stable and patient connected to nasal cannula oxygen Cardiovascular status: stable and blood pressure returned to baseline Postop Assessment: no apparent nausea or vomiting Anesthetic complications: no    Last Vitals:  Vitals:   08/07/19 1235 08/07/19 1245  BP: 117/87 (!) 116/92  Pulse: 60 61  Resp: 12 18  Temp:  (!) 36.4 C  SpO2: 96% 96%    Last Pain:  Vitals:   08/07/19 1245  TempSrc:   PainSc: 0-No pain                 Emilygrace Grothe

## 2019-08-10 DIAGNOSIS — N186 End stage renal disease: Secondary | ICD-10-CM | POA: Diagnosis not present

## 2019-08-10 DIAGNOSIS — Z992 Dependence on renal dialysis: Secondary | ICD-10-CM | POA: Diagnosis not present

## 2019-08-10 DIAGNOSIS — Z1159 Encounter for screening for other viral diseases: Secondary | ICD-10-CM | POA: Diagnosis not present

## 2019-08-12 DIAGNOSIS — Z992 Dependence on renal dialysis: Secondary | ICD-10-CM | POA: Diagnosis not present

## 2019-08-12 DIAGNOSIS — N186 End stage renal disease: Secondary | ICD-10-CM | POA: Diagnosis not present

## 2019-08-15 DIAGNOSIS — N186 End stage renal disease: Secondary | ICD-10-CM | POA: Diagnosis not present

## 2019-08-15 DIAGNOSIS — Z992 Dependence on renal dialysis: Secondary | ICD-10-CM | POA: Diagnosis not present

## 2019-08-17 DIAGNOSIS — N186 End stage renal disease: Secondary | ICD-10-CM | POA: Diagnosis not present

## 2019-08-17 DIAGNOSIS — Z992 Dependence on renal dialysis: Secondary | ICD-10-CM | POA: Diagnosis not present

## 2019-08-19 DIAGNOSIS — N186 End stage renal disease: Secondary | ICD-10-CM | POA: Diagnosis not present

## 2019-08-19 DIAGNOSIS — Z992 Dependence on renal dialysis: Secondary | ICD-10-CM | POA: Diagnosis not present

## 2019-08-22 DIAGNOSIS — Z992 Dependence on renal dialysis: Secondary | ICD-10-CM | POA: Diagnosis not present

## 2019-08-22 DIAGNOSIS — N2581 Secondary hyperparathyroidism of renal origin: Secondary | ICD-10-CM | POA: Diagnosis not present

## 2019-08-22 DIAGNOSIS — N186 End stage renal disease: Secondary | ICD-10-CM | POA: Diagnosis not present

## 2019-08-26 DIAGNOSIS — N186 End stage renal disease: Secondary | ICD-10-CM | POA: Diagnosis not present

## 2019-08-26 DIAGNOSIS — Z992 Dependence on renal dialysis: Secondary | ICD-10-CM | POA: Diagnosis not present

## 2019-08-28 DIAGNOSIS — N186 End stage renal disease: Secondary | ICD-10-CM | POA: Diagnosis not present

## 2019-08-28 DIAGNOSIS — Z992 Dependence on renal dialysis: Secondary | ICD-10-CM | POA: Diagnosis not present

## 2019-08-29 DIAGNOSIS — Z992 Dependence on renal dialysis: Secondary | ICD-10-CM | POA: Diagnosis not present

## 2019-08-29 DIAGNOSIS — N186 End stage renal disease: Secondary | ICD-10-CM | POA: Diagnosis not present

## 2019-08-29 DIAGNOSIS — Z23 Encounter for immunization: Secondary | ICD-10-CM | POA: Diagnosis not present

## 2019-08-30 ENCOUNTER — Telehealth: Payer: Self-pay | Admitting: *Deleted

## 2019-08-30 NOTE — Progress Notes (Signed)
POST OPERATIVE OFFICE NOTE    CC:  F/u for surgery  HPI:  This is a 53 y.o. female who is s/p left BC AVF on 08/07/2019 by Dr. Oneida Alar.  She called yesterday with c/o swelling in the left arm.  She presents today with complaints of numbness in her left hand.  She states this started at the time of her fistula creation.  She states the texture of objects is different than her right hand.  She has dropped things a couple of times.  She states she is also having some swelling in her upper arm with some pain in the backside of her arm.   She did not have these issues with access in her right arm.   She is right handed, but also does a great deal with her left hand.  She is worried about what the future holds.   She has a tunneled catheter on the left that was placed by [facility].  She did have a graft in the RUA and she tells me they attempted to de-clot this three times at [facility] prior to fistula creation.  She is having a difficult time wrapping her head around dialysis and just wants to go back to normal.  She states she was supposed to have an appt in [place] for kidney transplant, but her insurance company told her it was out of network.  She states her daughter is willing to give her a kidney, but she is not sure how she feels about this as she does not know if she will have kidney disease when she is older.   Allergies  Allergen Reactions   Ciprofloxacin Nausea And Vomiting   Ceftriaxone Rash   Dilaudid [Hydromorphone Hcl] Anxiety and Other (See Comments)    Causes hallucinations, anxiety, and panic per patient   Yellow Jacket Venom Swelling    Swelling at injection site   Adhesive [Tape] Rash    Please use paper tape   Brintellix [Vortioxetine] Itching   Cymbalta [Duloxetine Hcl] Itching   Penicillins Itching    Did it involve swelling of the face/tongue/throat, SOB, or low BP? No Did it involve sudden or severe rash/hives, skin peeling, or any reaction on the inside  of your mouth or nose? No Did you need to seek medical attention at a hospital or doctor's office? No When did it last happen?53 yrs old If all above answers are "NO", may proceed with cephalosporin use.    Current Outpatient Medications  Medication Sig Dispense Refill   acetaminophen (TYLENOL) 500 MG tablet Take 1,000 mg by mouth every 6 (six) hours as needed for moderate pain.     albuterol (PROVENTIL) (2.5 MG/3ML) 0.083% nebulizer solution Take 3 mLs (2.5 mg total) by nebulization every 4 (four) hours as needed for shortness of breath. 75 mL 12   atorvastatin (LIPITOR) 40 MG tablet Take 1 tablet (40 mg total) by mouth daily at 6 PM. 30 tablet 3   calcium acetate (PHOSLO) 667 MG capsule Take 2 capsules by mouth See admin instructions. Three times daily with meals and snacks     diclofenac sodium (VOLTAREN) 1 % GEL Apply 2 g topically 4 (four) times daily. (Patient taking differently: Apply 2 g topically daily as needed (JOINT PAIN). )     hydrALAZINE (APRESOLINE) 50 MG tablet Take 50 mg by mouth 2 (two) times daily.     isosorbide dinitrate (ISORDIL) 10 MG tablet Take 1 tablet (10 mg total) by mouth 2 (two) times  daily. 180 tablet 3   lidocaine-prilocaine (EMLA) cream Apply 1 application topically as needed (numbing).      loratadine (CLARITIN) 10 MG tablet Take 10 mg by mouth daily as needed for allergies.     metoprolol succinate (TOPROL-XL) 50 MG 24 hr tablet Take 50 mg by mouth daily. Take with or immediately following a meal.     multivitamin (RENA-VIT) TABS tablet Take 1 tablet by mouth daily.     nitroGLYCERIN (NITROSTAT) 0.4 MG SL tablet Place 1 tablet (0.4 mg total) under the tongue every 5 (five) minutes x 3 doses as needed for chest pain. 25 tablet 12   Oxycodone HCl 10 MG TABS Take 1 tablet (10 mg total) by mouth every 6 (six) hours as needed. 12 tablet 0   PROAIR HFA 108 (90 Base) MCG/ACT inhaler Inhale 2 puffs into the lungs every 4 (four) hours as needed for  shortness of breath.     sertraline (ZOLOFT) 100 MG tablet Take 100 mg by mouth daily.     venlafaxine (EFFEXOR) 37.5 MG tablet Take 37.5 mg by mouth 2 (two) times daily.     No current facility-administered medications for this visit.    Facility-Administered Medications Ordered in Other Visits  Medication Dose Route Frequency Provider Last Rate Last Dose   0.9 %  sodium chloride infusion    Continuous PRN Neldon Newport, CRNA         ROS:  See HPI  Physical Exam:  Today's Vitals   08/31/19 1348  BP: 120/85  Pulse: 81  Resp: 16  Temp: 98.7 F (37.1 C)  TempSrc: Oral  SpO2: 95%  Weight: 180 lb (81.6 kg)  Height: 5\' 5"  (1.651 m)   Body mass index is 29.95 kg/m.   Incision:  Well healed.  Extremities:  Left radial pulse is palpable.  The left ulnar pulse is also palpable.  She has excellent hand grip on the left.  She states that the feeling in the left hand is different than the right.  There is an excellent thrill in the fistula and this is easily palpable.    Assessment/Plan:  This is a 53 y.o. female who is s/p: left BC AVF on 08/07/2019 by Dr. Oneida Alar.  -pt appears to be having some steal sx.  Her motor is in tact and she has strong left hand grip.  She does have some numbness in the left hand.  She has an easily palpable left radial and ulnar pulse.  Discussed with Dr. Oneida Alar and we will have her return on 12/31 for duplex and see Dr. Oneida Alar.  Discussed with pt should she develop worsening pain or decreased motor in her left hand, that we would need to see her back sooner.  I explained steal syndrome to the pt and she understands.  She is in agreement with this plan.   -she also does have swelling in the left upper arm.  Her catheter is also on the left side, which may be contributing to the swelling.  She tells me her catheter is working well for dialysis.    Leontine Locket, PA-C [facility] [phone]  Clinic MD:  Oneida Alar

## 2019-08-30 NOTE — Telephone Encounter (Signed)
Patient called c/o significant swelling in left arm s/p creation of A/V fistula on 08/07/2019. She denies any fever or chills. Claims area does not look hot or infected. She states she has been elevating above level of heart and hand squeezes to reduce swelling.  Agreeable to see PA on 08/31/2019. She denied pain with grip.

## 2019-08-31 ENCOUNTER — Other Ambulatory Visit: Payer: Self-pay

## 2019-08-31 ENCOUNTER — Ambulatory Visit (INDEPENDENT_AMBULATORY_CARE_PROVIDER_SITE_OTHER): Payer: Self-pay | Admitting: Physician Assistant

## 2019-08-31 VITALS — BP 120/85 | HR 81 | Temp 98.7°F | Resp 16 | Ht 65.0 in | Wt 180.0 lb

## 2019-08-31 DIAGNOSIS — N186 End stage renal disease: Secondary | ICD-10-CM

## 2019-08-31 DIAGNOSIS — Z992 Dependence on renal dialysis: Secondary | ICD-10-CM

## 2019-09-01 DIAGNOSIS — N186 End stage renal disease: Secondary | ICD-10-CM | POA: Diagnosis not present

## 2019-09-01 DIAGNOSIS — Z23 Encounter for immunization: Secondary | ICD-10-CM | POA: Diagnosis not present

## 2019-09-01 DIAGNOSIS — Z992 Dependence on renal dialysis: Secondary | ICD-10-CM | POA: Diagnosis not present

## 2019-09-05 DIAGNOSIS — Z23 Encounter for immunization: Secondary | ICD-10-CM | POA: Diagnosis not present

## 2019-09-05 DIAGNOSIS — Z992 Dependence on renal dialysis: Secondary | ICD-10-CM | POA: Diagnosis not present

## 2019-09-05 DIAGNOSIS — N186 End stage renal disease: Secondary | ICD-10-CM | POA: Diagnosis not present

## 2019-09-06 DIAGNOSIS — M79602 Pain in left arm: Secondary | ICD-10-CM | POA: Diagnosis not present

## 2019-09-06 DIAGNOSIS — G894 Chronic pain syndrome: Secondary | ICD-10-CM | POA: Diagnosis not present

## 2019-09-06 DIAGNOSIS — R600 Localized edema: Secondary | ICD-10-CM | POA: Diagnosis not present

## 2019-09-07 ENCOUNTER — Other Ambulatory Visit: Payer: Self-pay | Admitting: Internal Medicine

## 2019-09-07 ENCOUNTER — Other Ambulatory Visit (HOSPITAL_COMMUNITY): Payer: Self-pay | Admitting: Internal Medicine

## 2019-09-07 DIAGNOSIS — Z23 Encounter for immunization: Secondary | ICD-10-CM | POA: Diagnosis not present

## 2019-09-07 DIAGNOSIS — Z992 Dependence on renal dialysis: Secondary | ICD-10-CM | POA: Diagnosis not present

## 2019-09-07 DIAGNOSIS — N186 End stage renal disease: Secondary | ICD-10-CM | POA: Diagnosis not present

## 2019-09-07 DIAGNOSIS — M79602 Pain in left arm: Secondary | ICD-10-CM

## 2019-09-09 DIAGNOSIS — Z992 Dependence on renal dialysis: Secondary | ICD-10-CM | POA: Diagnosis not present

## 2019-09-09 DIAGNOSIS — N186 End stage renal disease: Secondary | ICD-10-CM | POA: Diagnosis not present

## 2019-09-09 DIAGNOSIS — Z23 Encounter for immunization: Secondary | ICD-10-CM | POA: Diagnosis not present

## 2019-09-11 ENCOUNTER — Ambulatory Visit (HOSPITAL_COMMUNITY): Payer: Commercial Managed Care - HMO

## 2019-09-12 DIAGNOSIS — Z992 Dependence on renal dialysis: Secondary | ICD-10-CM | POA: Diagnosis not present

## 2019-09-12 DIAGNOSIS — N186 End stage renal disease: Secondary | ICD-10-CM | POA: Diagnosis not present

## 2019-09-12 DIAGNOSIS — Z23 Encounter for immunization: Secondary | ICD-10-CM | POA: Diagnosis not present

## 2019-09-15 ENCOUNTER — Ambulatory Visit (HOSPITAL_COMMUNITY)
Admission: RE | Admit: 2019-09-15 | Discharge: 2019-09-15 | Disposition: A | Discharge status: Home / Self Care | Payer: Medicare Other | Source: Ambulatory Visit | Attending: Internal Medicine | Admitting: Internal Medicine

## 2019-09-15 ENCOUNTER — Other Ambulatory Visit: Payer: Self-pay

## 2019-09-15 DIAGNOSIS — R600 Localized edema: Secondary | ICD-10-CM | POA: Diagnosis not present

## 2019-09-15 DIAGNOSIS — M79602 Pain in left arm: Secondary | ICD-10-CM | POA: Diagnosis not present

## 2019-09-16 DIAGNOSIS — N186 End stage renal disease: Secondary | ICD-10-CM | POA: Diagnosis not present

## 2019-09-16 DIAGNOSIS — Z992 Dependence on renal dialysis: Secondary | ICD-10-CM | POA: Diagnosis not present

## 2019-09-16 DIAGNOSIS — Z23 Encounter for immunization: Secondary | ICD-10-CM | POA: Diagnosis not present

## 2019-09-19 DIAGNOSIS — N186 End stage renal disease: Secondary | ICD-10-CM | POA: Diagnosis not present

## 2019-09-19 DIAGNOSIS — Z992 Dependence on renal dialysis: Secondary | ICD-10-CM | POA: Diagnosis not present

## 2019-09-19 DIAGNOSIS — Z23 Encounter for immunization: Secondary | ICD-10-CM | POA: Diagnosis not present

## 2019-09-21 ENCOUNTER — Other Ambulatory Visit: Payer: Self-pay

## 2019-09-21 ENCOUNTER — Encounter (HOSPITAL_COMMUNITY): Payer: Medicare Other

## 2019-09-21 DIAGNOSIS — Z992 Dependence on renal dialysis: Secondary | ICD-10-CM

## 2019-09-21 DIAGNOSIS — N186 End stage renal disease: Secondary | ICD-10-CM

## 2019-09-24 DIAGNOSIS — N186 End stage renal disease: Secondary | ICD-10-CM | POA: Diagnosis not present

## 2019-09-24 DIAGNOSIS — Z23 Encounter for immunization: Secondary | ICD-10-CM | POA: Diagnosis not present

## 2019-09-24 DIAGNOSIS — Z992 Dependence on renal dialysis: Secondary | ICD-10-CM | POA: Diagnosis not present

## 2019-09-26 DIAGNOSIS — N186 End stage renal disease: Secondary | ICD-10-CM | POA: Diagnosis not present

## 2019-09-26 DIAGNOSIS — Z992 Dependence on renal dialysis: Secondary | ICD-10-CM | POA: Diagnosis not present

## 2019-09-26 DIAGNOSIS — Z23 Encounter for immunization: Secondary | ICD-10-CM | POA: Diagnosis not present

## 2019-09-28 ENCOUNTER — Encounter: Payer: Self-pay | Admitting: Vascular Surgery

## 2019-09-28 ENCOUNTER — Encounter (HOSPITAL_COMMUNITY): Payer: Commercial Managed Care - HMO

## 2019-09-28 DIAGNOSIS — Z992 Dependence on renal dialysis: Secondary | ICD-10-CM | POA: Diagnosis not present

## 2019-09-28 DIAGNOSIS — N186 End stage renal disease: Secondary | ICD-10-CM | POA: Diagnosis not present

## 2019-09-30 DIAGNOSIS — N186 End stage renal disease: Secondary | ICD-10-CM | POA: Diagnosis not present

## 2019-09-30 DIAGNOSIS — Z992 Dependence on renal dialysis: Secondary | ICD-10-CM | POA: Diagnosis not present

## 2019-10-02 NOTE — Progress Notes (Deleted)
Cardiology Office Note    Date:  10/02/2019   ID:  Summer Cook, DOB [birth year], MRN [id number]  PCP:  Redmond School, MD  Cardiologist: Rozann Lesches, MD    No chief complaint on file.   History of Present Illness:    Summer Cook is a 54 y.o. female with past medical history of CAD (s/p NSTEMI in 2018 with chronically occluded dPDA with medical management recommended), chronic combined systolic and diastolic CHF (EF 15-72% by echo in 12/2018), HTN, HLD, prior CVA, prior PE, and ESRD (on HD) who presents to the office today for 62-monthfollow-up.   She most recently had a telehealth visit with Dr. MDomenic Politein 06/2019 and reported having issues with hypotension on her HD days. Denied any recent chest pain and her breathing had overall been stable. She was continued on Toprol-XL, Isordil and Hydralazine in the setting of her cardiomyopathy with consideration of discussing resynchronization therapy with EP in the future given her LBBB.      Past Medical History:  Diagnosis Date  . Allergy   . Anxiety   . Arthritis   . Asthma   . CAD (coronary artery disease), native coronary artery    a. cath 05/20/17 - 100% distal PDA --> medical mangement (no intervention done)  . Candida esophagitis ([facility]   . Chronic combined systolic and diastolic CHF (congestive heart failure) ([facility]   . Depression   . End stage renal disease on dialysis ([facility]    T/ Th/ Sat starting 4/28- [facility]  . Essential hypertension   . GERD (gastroesophageal reflux disease)   . Gout   . Headache   . Hemorrhoids   . History of kidney stones    passed  . 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  Last was Prednisone 45m 2 wk in 10/2012.  She also was on Prednisone bridged with Cellcept briefly but stopped due to lack of insurance.   . Myocardial infarction ([facility]  . Perinephric hematoma     . Pneumonia   . Pulmonary embolism ([facility]8/2013  . Serrated adenoma of colon   . Steroid-induced diabetes ([facility]   "when taking Prednisone"  . Stroke ([facility]  . Thrush, oral 05/29/2011    Past Surgical History:  Procedure Laterality Date  . AV FISTULA PLACEMENT Right 01/23/2019   Procedure: ARTERIOVENOUS (AV) FISTULA CREATION RIGHT ARM;  Surgeon: FiElam DutchMD;  [facility];  Laterality: Right;  . AV FISTULA PLACEMENT Right 01/30/2019   Procedure: GORETEX GRAFT PLACEMENT  RIGHT ARM;  Surgeon: FiElam DutchMD;  [facility];  Service: Vascular;  Laterality: Right;  . AV FISTULA PLACEMENT Left 08/07/2019   Procedure: LEFT BRACHIOCEPHALIC VENOUS FISTULA;  Surgeon: FiElam DutchMD;  Location: [facility] Service: Vascular;  Laterality: Left;  . BONE MARROW BIOPSY    . CARDIAC CATHETERIZATION    . CARPAL TUNNEL RELEASE    . CARPAL TUNNEL RELEASE Left 05/20/2016   Procedure: CARPAL TUNNEL RELEASE;  Surgeon: StCarole CivilMD;  Location: [facility]S;  Service: Orthopedics;  Laterality: Left;  . CARPAL TUNNEL RELEASE Right 07/16/2016   Procedure: RIGHT CARPAL TUNNEL RELEASE;  Surgeon: StCarole CivilMD;  Location: [facility];  Service: Orthopedics;  Laterality: Right;  . CATARACT EXTRACTION    . CHOLECYSTECTOMY  2008  . COLONOSCOPY WITH ESOPHAGOGASTRODUODENOSCOPY (EGD) N/A 01/04/2013  Dr. Gala Romney: esophageal plaques with +KOH, hh. Gastric antrum abnormal, bx reactive gastropathy. Anal canal hemorrhoids, colonic tics, normal TI, single polyp (sessile serrated tubular adenoma). Next TCS 12/2017  . IR FLUORO GUIDE CV LINE RIGHT  01/10/2019  . IR US GUIDE VASC ACCESS RIGHT  01/10/2019  . LEFT HEART CATH AND CORONARY ANGIOGRAPHY N/A 05/20/2017   Procedure: LEFT HEART CATH AND CORONARY ANGIOGRAPHY;  Surgeon: Martinique, Peter M, MD;  Location: [place] CV LAB;  Service: Cardiovascular;  Laterality: N/A;  . 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 TEE WITHOUT CARDIOVERSION N/A  01/03/2016   Procedure: TRANSESOPHAGEAL ECHOCARDIOGRAM (TEE) WITH PROPOFOL;  Surgeon: Satira Sark, MD;  Location: [facility];  Service: Cardiovascular;  Laterality: N/A;    Current Medications: Outpatient Medications Prior to Visit  Medication Sig Dispense Refill  . acetaminophen (TYLENOL) 500 MG tablet Take 1,000 mg by mouth every 6 (six) hours as needed for moderate pain.    Marland Kitchen albuterol (PROVENTIL) (2.5 MG/3ML) 0.083% nebulizer solution Take 3 mLs (2.5 mg total) by nebulization every 4 (four) hours as needed for shortness of breath. 75 mL 12  . atorvastatin (LIPITOR) 40 MG tablet Take 1 tablet (40 mg total) by mouth daily at 6 PM. 30 tablet 3  . calcium acetate (PHOSLO) 667 MG capsule Take 2 capsules by mouth See admin instructions. Three times daily with meals and snacks    . diclofenac sodium (VOLTAREN) 1 % GEL Apply 2 g topically 4 (four) times daily. (Patient taking differently: Apply 2 g topically daily as needed (JOINT PAIN). )    . hydrALAZINE (APRESOLINE) 50 MG tablet Take 50 mg by mouth 2 (two) times daily.    . isosorbide dinitrate (ISORDIL) 10 MG tablet Take 1 tablet (10 mg total) by mouth 2 (two) times daily. 180 tablet 3  . lidocaine-prilocaine (EMLA) cream Apply 1 application topically as needed (numbing).     Marland Kitchen loratadine (CLARITIN) 10 MG tablet Take 10 mg by mouth daily as needed for allergies.    . metoprolol succinate (TOPROL-XL) 50 MG 24 hr tablet Take 50 mg by mouth daily. Take with or immediately following a meal.    . multivitamin (RENA-VIT) TABS tablet Take 1 tablet by mouth daily.    . nitroGLYCERIN (NITROSTAT) 0.4 MG SL tablet Place 1 tablet (0.4 mg total) under the tongue every 5 (five) minutes x 3 doses as needed for chest pain. 25 tablet 12  . Oxycodone HCl 10 MG TABS Take 1 tablet (10 mg total) by mouth every 6 (six) hours as needed. 12 tablet 0  . PROAIR HFA 108 (90 Base) MCG/ACT inhaler Inhale 2 puffs into the lungs every 4 (four) hours as needed for  shortness of breath.    . sertraline (ZOLOFT) 100 MG tablet Take 100 mg by mouth daily.    Marland Kitchen venlafaxine (EFFEXOR) 37.5 MG tablet Take 37.5 mg by mouth 2 (two) times daily.     Facility-Administered Medications Prior to Visit  Medication Dose Route Frequency Provider Last Rate Last Admin  . 0.9 %  sodium chloride infusion    Continuous PRN Neldon Newport, CRNA   New Bag at 07/18/19 2426     Allergies:   Ciprofloxacin, Ceftriaxone, Dilaudid [hydromorphone hcl], Yellow jacket venom, Adhesive [tape], Brintellix [vortioxetine], Cymbalta [duloxetine hcl], and Penicillins   Social History   Socioeconomic History  . Marital status: Divorced    Spouse name: Not on file  . Number of children: 1  . Years of education: Not  on file  . 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   Tobacco Use  . Smoking status: Former Smoker    Years: 0.00    Types: Cigarettes    Quit date: 02/14/2009    Years since quitting: 10.6  . Smokeless tobacco: Current User    Types: Snuff  Substance and Sexual Activity  . Alcohol use: No    Alcohol/week: 0.0 standard drinks  . Drug use: No  . Sexual activity: Never  Other Topics Concern  . Not on file  Social History Narrative   Lives with her daughter and aunt in a one story home.  Has 1 daughter.  On disability.  Did work as a Optometrist and bus Geophysicist/field seismologist.  Education: some college.    Social Determinants of Health   Financial Resource Strain:   . Difficulty of Paying Living Expenses: Not on file  Food Insecurity:   . Worried About Charity fundraiser in the Last Year: Not on file  . Ran Out of Food in the Last Year: Not on file  Transportation Needs:   . Lack of Transportation (Medical): Not on file  . Lack of Transportation (Non-Medical): Not on file  Physical Activity: Unknown  . Days of Exercise per Week: Patient refused  . Minutes of Exercise per Session:  Patient refused  Stress:   . Feeling of Stress : Not on file  Social Connections:   . Frequency of Communication with Friends and Family: Not on file  . Frequency of Social Gatherings with Friends and Family: Not on file  . Attends Religious Services: Not on file  . Active Member of Clubs or Organizations: Not on file  . Attends Archivist Meetings: Not on file  . Marital Status: Not on file     Family History:  The patient's ***family history includes AAA (abdominal aortic aneurysm) in her paternal grandmother; Barrett's esophagus in her paternal aunt; Breast cancer in her paternal grandmother; Cervical cancer in her mother; Colon cancer (age of onset: 9) in her paternal grandfather; Colon cancer (age of onset: 43) in her paternal grandmother; Colon polyps in her father; Lung cancer in her father; Pneumonia in her brother.   Review of Systems:   Please see the history of present illness.     General:  No chills, fever, night sweats or weight changes.  Cardiovascular:  No chest pain, dyspnea on exertion, edema, orthopnea, palpitations, paroxysmal nocturnal dyspnea. Dermatological: No rash, lesions/masses Respiratory: No cough, dyspnea Urologic: No hematuria, dysuria Abdominal:   No nausea, vomiting, diarrhea, bright red blood per rectum, melena, or hematemesis Neurologic:  No visual changes, wkns, changes in mental status. All other systems reviewed and are otherwise negative except as noted above.   Physical Exam:    VS:  LMP 08/13/2011    General: Well developed, well nourished,female appearing in no acute distress. Head: Normocephalic, atraumatic, sclera non-icteric, no xanthomas, nares are without discharge.  Neck: No carotid bruits. JVD not elevated.  Lungs: Respirations regular and unlabored, without wheezes or rales.  Heart: ***Regular rate and rhythm. No S3 or S4.  No murmur, no rubs, or gallops appreciated. Abdomen: Soft, non-tender, non-distended with  normoactive bowel sounds. No hepatomegaly. No rebound/guarding. No obvious abdominal masses. Msk:  Strength and tone appear normal for age. No joint deformities or effusions. Extremities: No clubbing or cyanosis. No edema.  Distal pedal pulses are  2+ bilaterally. Neuro: Alert and oriented X 3. Moves all extremities spontaneously. No focal deficits noted. Psych:  Responds to questions appropriately with a normal affect. Skin: No rashes or lesions noted  Wt Readings from Last 3 Encounters:  08/31/19 180 lb (81.6 kg)  08/07/19 179 lb 10.8 oz (81.5 kg)  07/28/19 185 lb 3 oz (84 kg)        Studies/Labs Reviewed:   EKG:  EKG is*** ordered today.  The ekg ordered today demonstrates ***  Recent Labs: 03/09/2019: ALT 14 03/13/2019: Platelets 174 03/14/2019: Magnesium 2.0 08/07/2019: BUN 41; Creatinine, Ser 4.90; Hemoglobin 15.0; Potassium 4.4; Sodium 134   Lipid Panel    Component Value Date/Time   CHOL 229 (H) 05/19/2017 1912   TRIG 112 01/10/2019 0234   HDL 57 05/19/2017 1912   CHOLHDL 4.0 05/19/2017 1912   VLDL 23 05/19/2017 1912   LDLCALC 149 (H) 05/19/2017 1912    Additional studies/ records that were reviewed today include:   Cardiac Catheterization: 04/2017  RPDA lesion, 100 %stenosed.  The left ventricular systolic function is normal.  LV end diastolic pressure is normal.  The left ventricular ejection fraction is 55-65% by visual estimate.   1. 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 Otherwise normal coronary arteries. 3. Normal LV function with very small focal area of distal inferior hypokinesis.  Plan: medical therapy.  Echocardiogram: 06/2019 IMPRESSIONS    1. Left ventricular ejection fraction, by visual estimation, is 40%. The left ventricle has moderately decreased function. There is borderline left ventricular hypertrophy.  2. Left ventricular diastolic parameters are consistent with Grade I diastolic dysfunction (impaired  relaxation).  3. Diffuse LV hypokinesis.  4. Global right ventricle has low normal systolic function.The right ventricular size is normal. No increase in right ventricular wall thickness.  5. Left atrial size was normal.  6. Right atrial size was normal.  7. The mitral valve is grossly normal. Moderate mitral valve regurgitation.  8. The tricuspid valve is grossly normal. Tricuspid valve regurgitation is trivial.  9. The aortic valve is tricuspid. Aortic valve regurgitation is not visualized. No evidence of aortic valve sclerosis or stenosis. 10. The pulmonic valve was grossly normal. Pulmonic valve regurgitation is not visualized. 11. Normal pulmonary artery systolic pressure. 12. The inferior vena cava is normal in size with greater than 50% respiratory variability, suggesting right atrial pressure of 3 mmHg.  Assessment:    No diagnosis found.   Plan:   In order of problems listed above:  1. ***    Medication Adjustments/Labs and Tests Ordered: Current medicines are reviewed at length with the patient today.  Concerns regarding medicines are outlined above.  Medication changes, Labs and Tests ordered today are listed in the Patient Instructions below. There are no Patient Instructions on file for this visit.   Signed, Erma Heritage, PA-C  10/02/2019 7:55 AM    [facility] [address] [place], [place] [address] Phone: [phone] Fax: [phone]

## 2019-10-03 ENCOUNTER — Ambulatory Visit: Payer: Medicare Other | Admitting: Student

## 2019-10-03 DIAGNOSIS — N186 End stage renal disease: Secondary | ICD-10-CM | POA: Diagnosis not present

## 2019-10-03 DIAGNOSIS — Z992 Dependence on renal dialysis: Secondary | ICD-10-CM | POA: Diagnosis not present

## 2019-10-04 ENCOUNTER — Encounter: Payer: Self-pay | Admitting: Student

## 2019-10-04 IMAGING — CT CT ANGIO CHEST
2 of 6 series · 19 of 46 positions shown · IV contrast (Isovue)
Comparison: Radiographs earlier this day.  Chest CT 05/19/2017

CLINICAL DATA: Chest pain.  PE suspected, high pretest prob

EXAM:
CT ANGIOGRAPHY CHEST WITH CONTRAST
TECHNIQUE: Multidetector CT imaging of the chest was performed using the
standard protocol during bolus administration of intravenous
contrast. Multiplanar CT image reconstructions and MIPs were
obtained to evaluate the vascular anatomy.
CONTRAST:  60mL PHMWCV-WR3 IOPAMIDOL (PHMWCV-WR3) INJECTION 76%

[Series 7: thins · axial · 0.73mm/px · z∈[+1286,+1554]mm · 16 of 294 slices shown]
[im 13/294  lung]
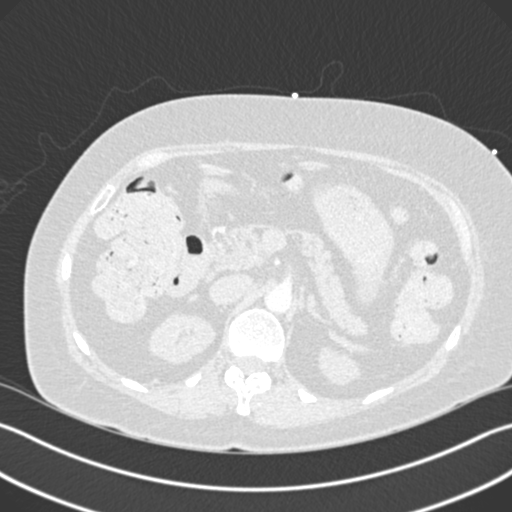
[im 39/294  soft-tissue]
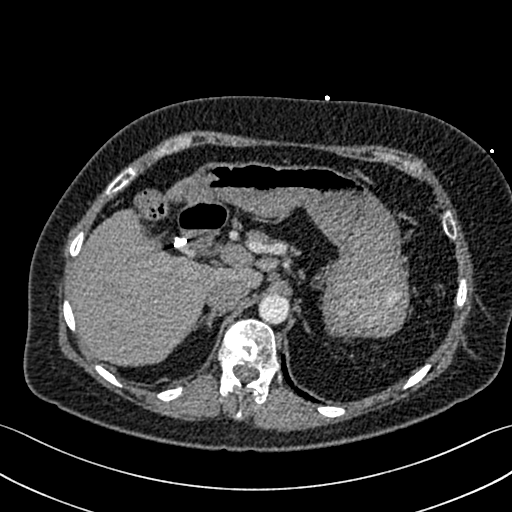
[im 51/294  lung]
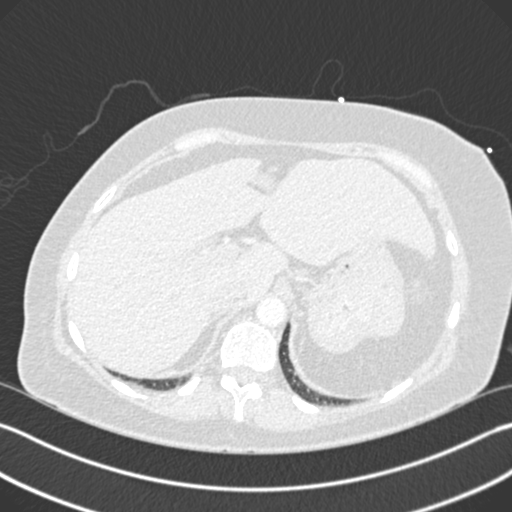
[im 64/294  soft-tissue]
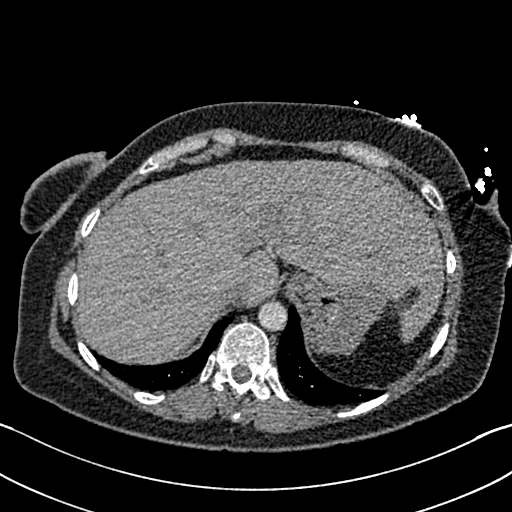
[im 90/294  lung]
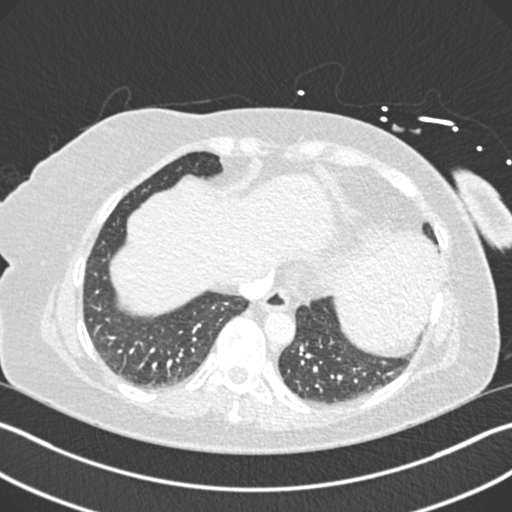
[im 102/294  soft-tissue]
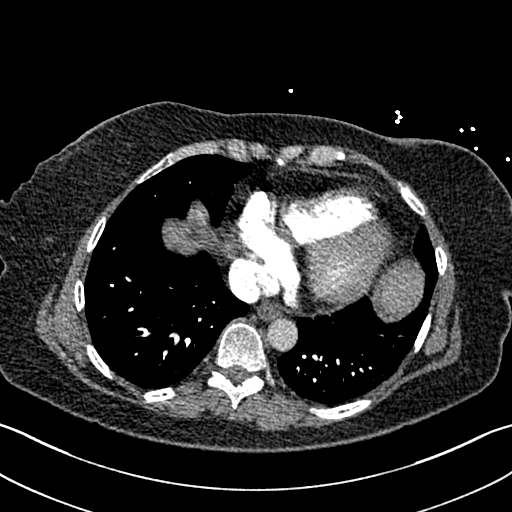
[im 115/294  lung]
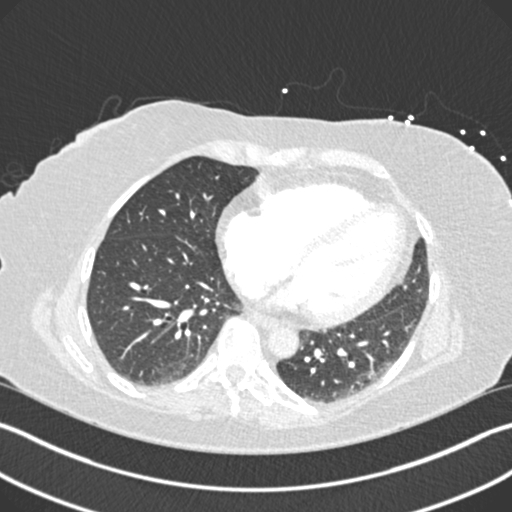
[im 141/294  soft-tissue]
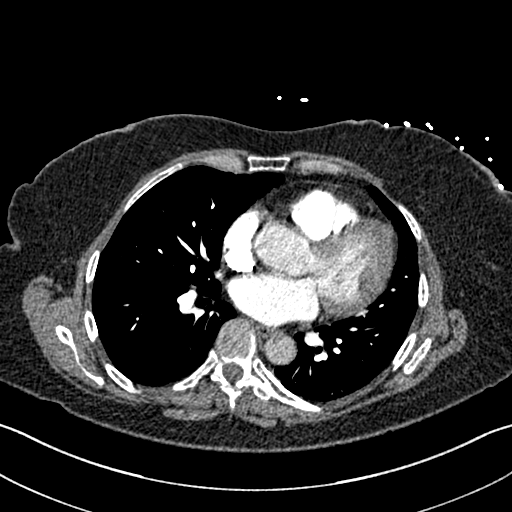
[im 153/294  lung]
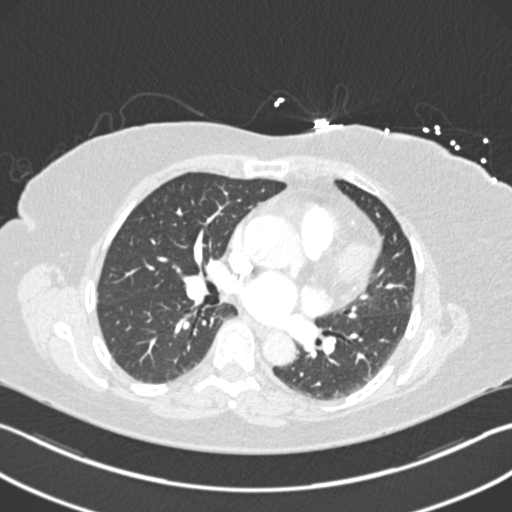
[im 179/294  soft-tissue]
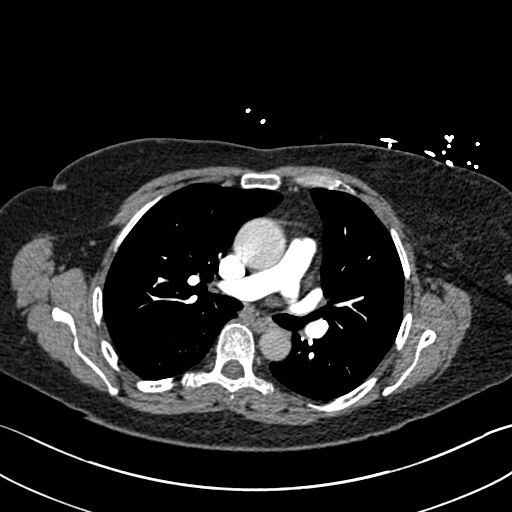
[im 192/294  lung]
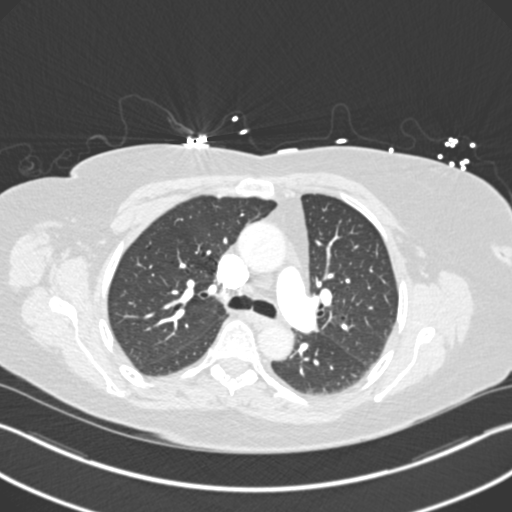
[im 204/294  soft-tissue]
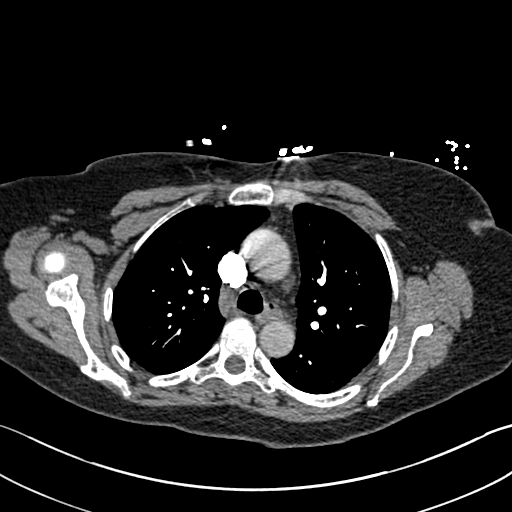
[im 230/294  lung]
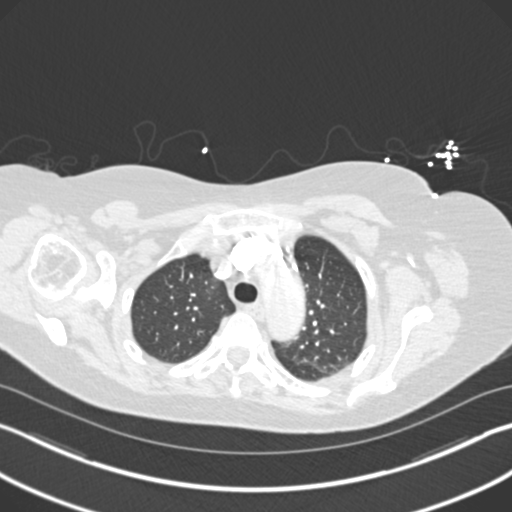
[im 243/294  soft-tissue]
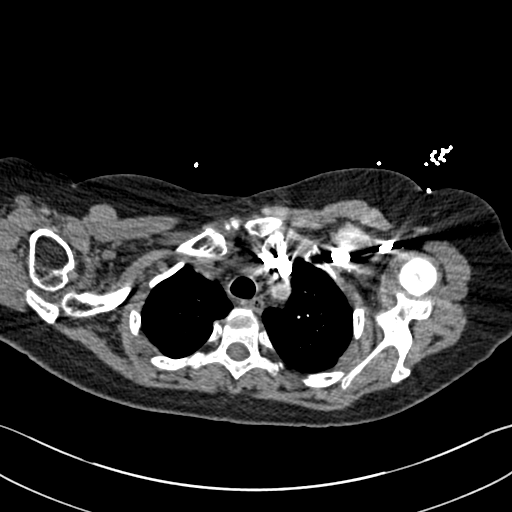
[im 255/294  lung]
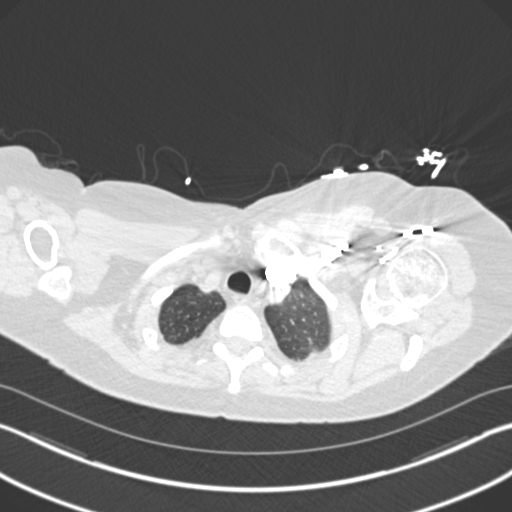
[im 281/294  soft-tissue]
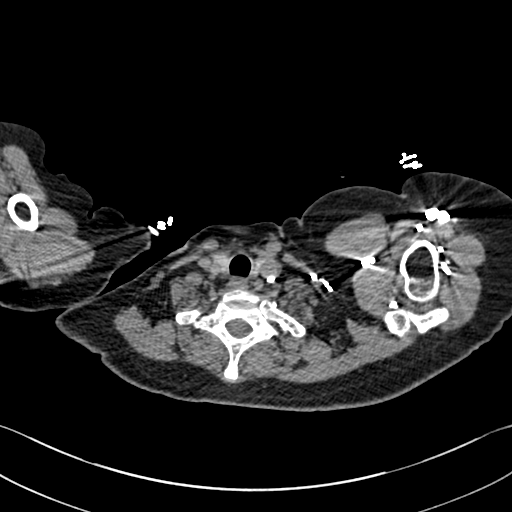

[Series 10: coronal mpr · coronal · 0.52mm/px · 3 of 122 slices shown]
[im 31/122  soft-tissue]
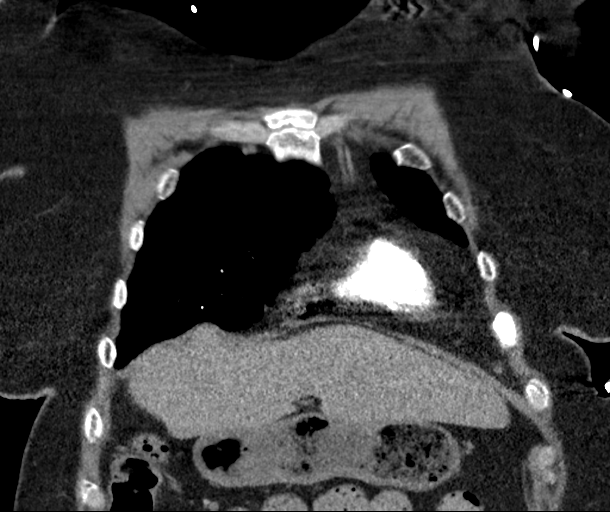
[im 61/122  soft-tissue]
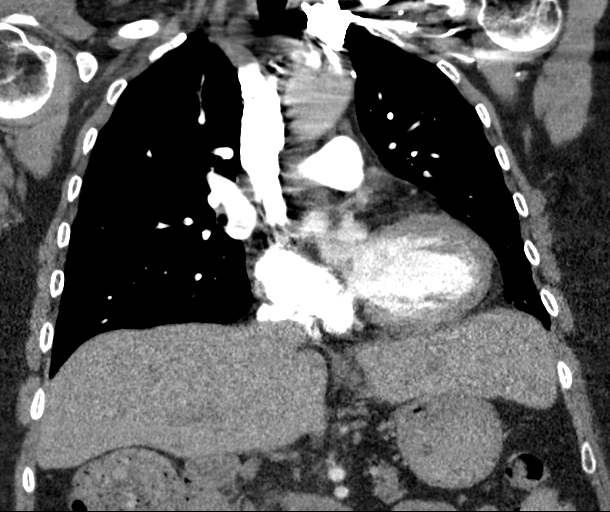
[im 91/122  soft-tissue]
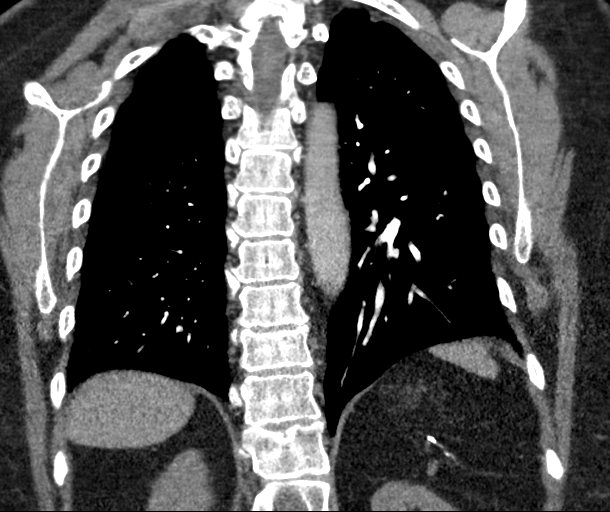

[19 of 46 positions shown; findings below may reference images not displayed]

FINDINGS: Cardiovascular: There are no filling defects within the pulmonary
arteries to suggest pulmonary embolus. No aortic dissection. Heart
size upper normal. No pericardial effusion.

Mediastinum/Nodes: No enlarged mediastinal or hilar lymph nodes.
Esophagus decompressed. No visualized thyroid nodule.

Lungs/Pleura: No focal consolidation, pulmonary edema, or pleural
effusion. No pulmonary mass.

Upper Abdomen: No acute findings. Postcholecystectomy. Prior
splenectomy.

Musculoskeletal: Unchanged chronic mild compression deformities in
the lower thoracic spine. There are no acute or suspicious osseous
abnormalities.

Review of the MIP images confirms the above findings.
IMPRESSION: 1. No pulmonary embolus.
2. No acute chest finding.

## 2019-10-04 IMAGING — DX DG CHEST 2V
2 series · 2 of 2 positions shown · non-contrast
Comparison: 02/12/2018

CLINICAL DATA: Chest pain

EXAM:
CHEST - 2 VIEW

[chest pa]
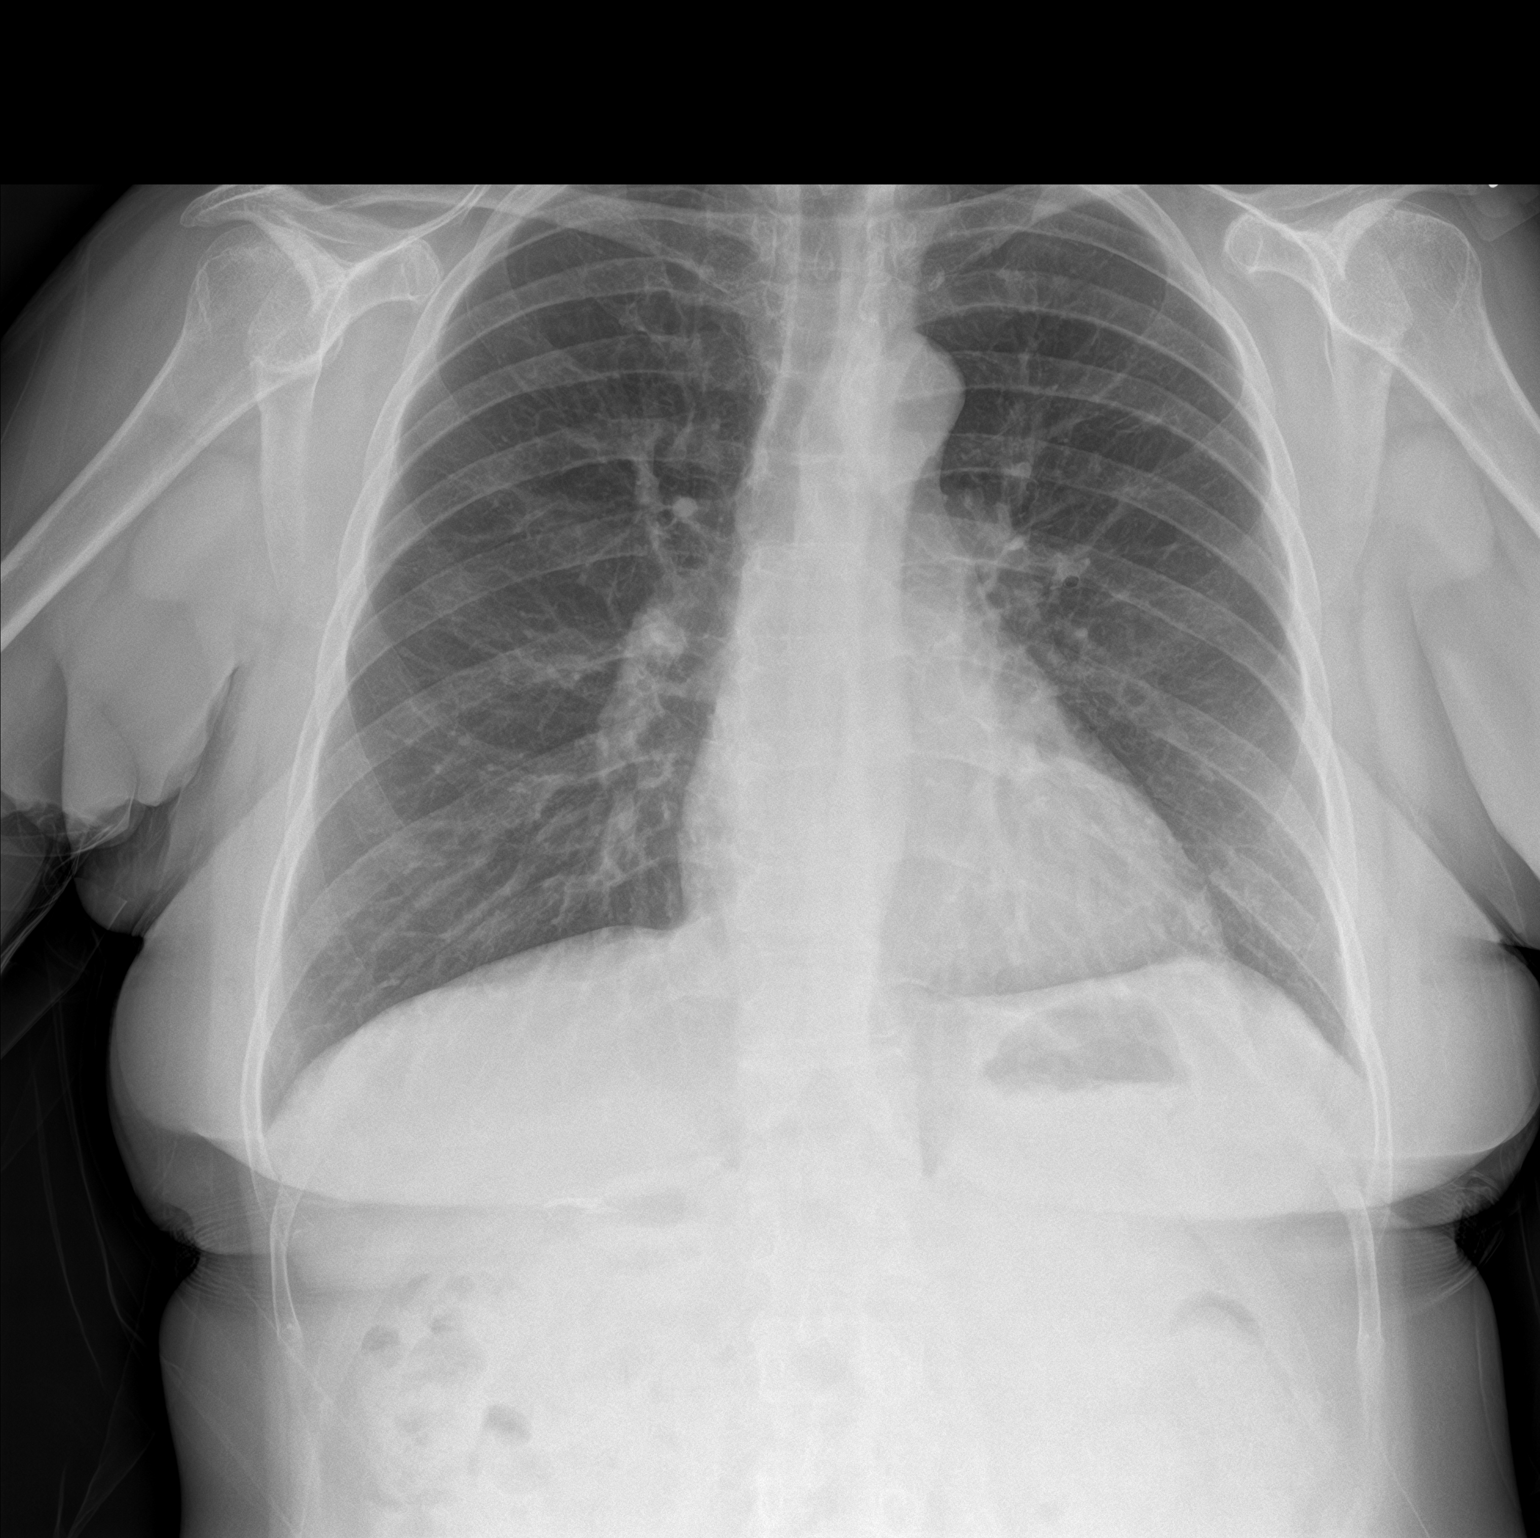

[chest lat]
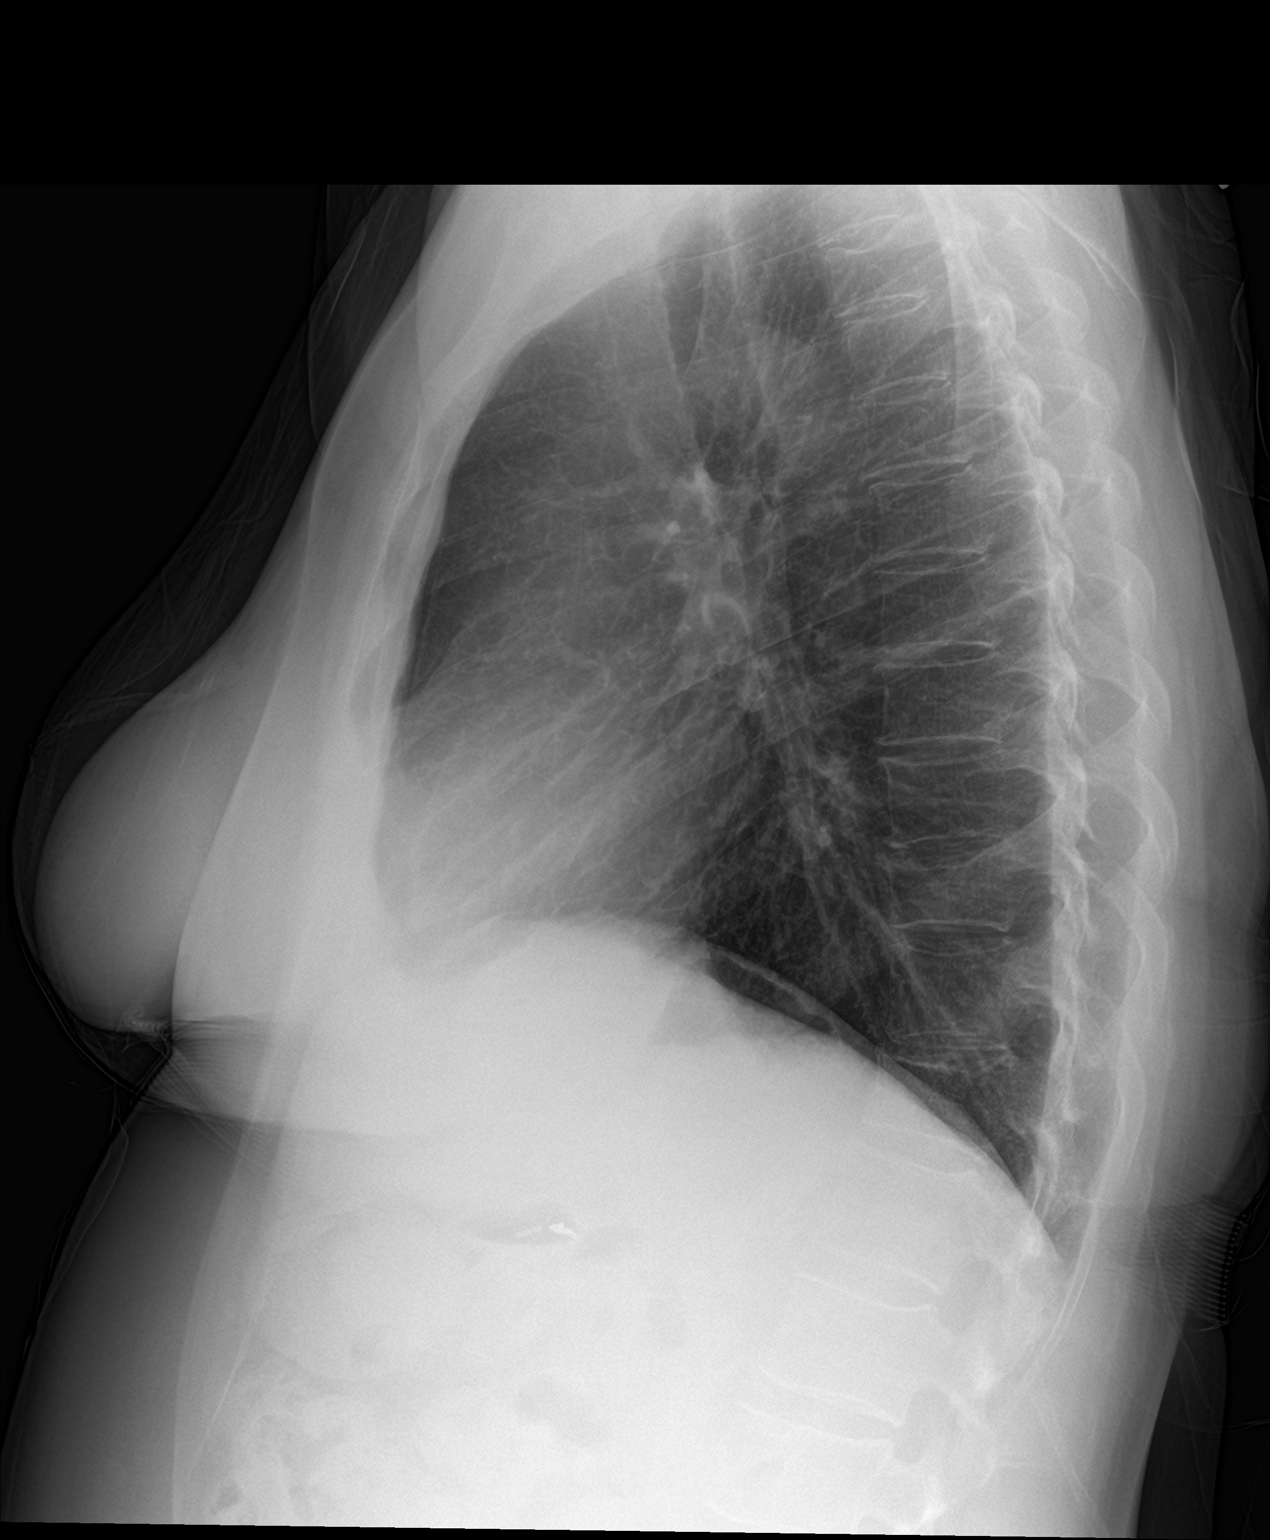

[2 of 2 positions shown; findings below may reference images not displayed]

FINDINGS: Hyperinflation. No focal opacity or pleural effusion. Normal
cardiomediastinal silhouette. No pneumothorax. Stable chronic
wedging deformities of the lower thoracic spine.
IMPRESSION: No active cardiopulmonary disease.

## 2019-10-05 DIAGNOSIS — I10 Essential (primary) hypertension: Secondary | ICD-10-CM | POA: Diagnosis not present

## 2019-10-05 DIAGNOSIS — G894 Chronic pain syndrome: Secondary | ICD-10-CM | POA: Diagnosis not present

## 2019-10-05 DIAGNOSIS — E1129 Type 2 diabetes mellitus with other diabetic kidney complication: Secondary | ICD-10-CM | POA: Diagnosis not present

## 2019-10-05 DIAGNOSIS — G4700 Insomnia, unspecified: Secondary | ICD-10-CM | POA: Diagnosis not present

## 2019-10-07 DIAGNOSIS — N186 End stage renal disease: Secondary | ICD-10-CM | POA: Diagnosis not present

## 2019-10-07 DIAGNOSIS — Z992 Dependence on renal dialysis: Secondary | ICD-10-CM | POA: Diagnosis not present

## 2019-10-10 DIAGNOSIS — N2581 Secondary hyperparathyroidism of renal origin: Secondary | ICD-10-CM | POA: Diagnosis not present

## 2019-10-10 DIAGNOSIS — Z992 Dependence on renal dialysis: Secondary | ICD-10-CM | POA: Diagnosis not present

## 2019-10-10 DIAGNOSIS — N186 End stage renal disease: Secondary | ICD-10-CM | POA: Diagnosis not present

## 2019-10-10 DIAGNOSIS — E119 Type 2 diabetes mellitus without complications: Secondary | ICD-10-CM | POA: Diagnosis not present

## 2019-10-10 DIAGNOSIS — D509 Iron deficiency anemia, unspecified: Secondary | ICD-10-CM | POA: Diagnosis not present

## 2019-10-12 ENCOUNTER — Telehealth: Payer: Self-pay | Admitting: *Deleted

## 2019-10-12 NOTE — Telephone Encounter (Signed)
Patient called to schedule missed 09/28/2019 appt. She claims she has continued swelling in arm. She states she is elevating arm above level of heart. I instructed her to do hand/finger exercises. She denies fever or chills and states arm is not hot or red. Appt given to see PA on non-HD day first available. Instructed to go to [facility] ER if acute or worsening condition.

## 2019-10-14 DIAGNOSIS — N186 End stage renal disease: Secondary | ICD-10-CM | POA: Diagnosis not present

## 2019-10-14 DIAGNOSIS — Z992 Dependence on renal dialysis: Secondary | ICD-10-CM | POA: Diagnosis not present

## 2019-10-17 ENCOUNTER — Telehealth (HOSPITAL_COMMUNITY): Payer: Self-pay

## 2019-10-17 NOTE — Progress Notes (Signed)
POST OPERATIVE OFFICE NOTE    CC:  F/u for surgery  HPI:  This is a 54 y.o. female who is s/p left brachiocephalic arteriovenous fistula on August 07, 2019 by Dr. Oneida Alar.  She was seen approximately 6 weeks ago complaining of dysesthesia of her left hand.  She had normal motor strength and palpable pulses.  She returns today for evaluation.   She reports mild dysesthesia of her left fingertips.  She denies problems with motor function, cold sensation or left hand pain.  She is dialyzing via her left IJ catheter without complications.  She denies fever or chills HD center: Treatment days: MWF Access: left IJ TDC   Allergies  Allergen Reactions  . Ciprofloxacin Nausea And Vomiting  . Ceftriaxone Rash  . Dilaudid [Hydromorphone Hcl] Anxiety and Other (See Comments)    Causes hallucinations, anxiety, and panic per patient  . Yellow Jacket Venom Swelling    Swelling at injection site  . Adhesive [Tape] Rash    Please use paper tape  . Brintellix [Vortioxetine] Itching  . Cymbalta [Duloxetine Hcl] Itching  . Penicillins Itching    Did it involve swelling of the face/tongue/throat, SOB, or low BP? No Did it involve sudden or severe rash/hives, skin peeling, or any reaction on the inside of your mouth or nose? No Did you need to seek medical attention at a hospital or doctor's office? No When did it last happen?54 yrs old If all above answers are "NO", may proceed with cephalosporin use.    Current Outpatient Medications  Medication Sig Dispense Refill  . acetaminophen (TYLENOL) 500 MG tablet Take 1,000 mg by mouth every 6 (six) hours as needed for moderate pain.    Marland Kitchen albuterol (PROVENTIL) (2.5 MG/3ML) 0.083% nebulizer solution Take 3 mLs (2.5 mg total) by nebulization every 4 (four) hours as needed for shortness of breath. 75 mL 12  . atorvastatin (LIPITOR) 40 MG tablet Take 1 tablet (40 mg total) by mouth daily at 6 PM. 30 tablet 3  . calcium acetate (PHOSLO) 667 MG capsule  Take 2 capsules by mouth See admin instructions. Three times daily with meals and snacks    . diclofenac sodium (VOLTAREN) 1 % GEL Apply 2 g topically 4 (four) times daily. (Patient taking differently: Apply 2 g topically daily as needed (JOINT PAIN). )    . hydrALAZINE (APRESOLINE) 50 MG tablet Take 50 mg by mouth 2 (two) times daily.    . isosorbide dinitrate (ISORDIL) 10 MG tablet Take 1 tablet (10 mg total) by mouth 2 (two) times daily. 180 tablet 3  . lidocaine-prilocaine (EMLA) cream Apply 1 application topically as needed (numbing).     Marland Kitchen loratadine (CLARITIN) 10 MG tablet Take 10 mg by mouth daily as needed for allergies.    . metoprolol succinate (TOPROL-XL) 50 MG 24 hr tablet Take 50 mg by mouth daily. Take with or immediately following a meal.    . multivitamin (RENA-VIT) TABS tablet Take 1 tablet by mouth daily.    . nitroGLYCERIN (NITROSTAT) 0.4 MG SL tablet Place 1 tablet (0.4 mg total) under the tongue every 5 (five) minutes x 3 doses as needed for chest pain. 25 tablet 12  . Oxycodone HCl 10 MG TABS Take 1 tablet (10 mg total) by mouth every 6 (six) hours as needed. 12 tablet 0  . PROAIR HFA 108 (90 Base) MCG/ACT inhaler Inhale 2 puffs into the lungs every 4 (four) hours as needed for shortness of breath.    Marland Kitchen  sertraline (ZOLOFT) 100 MG tablet Take 100 mg by mouth daily.    Marland Kitchen venlafaxine (EFFEXOR) 37.5 MG tablet Take 37.5 mg by mouth 2 (two) times daily.     No current facility-administered medications for this visit.   Facility-Administered Medications Ordered in Other Visits  Medication Dose Route Frequency Provider Last Rate Last Admin  . 0.9 %  sodium chloride infusion    Continuous PRN Neldon Newport, CRNA   New Bag at 07/18/19 0919     ROS:  See HPI  Physical Exam: Findings: +--------------------+----------+-----------------+--------+ AVF                 PSV (cm/s)Flow Vol (mL/min)Comments +--------------------+----------+-----------------+--------+ Native  artery inflow   171          1799                +--------------------+----------+-----------------+--------+ AVF Anastomosis        506                              +--------------------+----------+-----------------+--------+    +------------+----------+-------------+----------+-----------------------------+ OUTFLOW VEINPSV (cm/s)Diameter (cm)Depth (cm)          Describe            +------------+----------+-------------+----------+-----------------------------+ Shoulder        79        0.94        1.58                                 +------------+----------+-------------+----------+-----------------------------+ Prox UA         78        0.88        1.05                                 +------------+----------+-------------+----------+-----------------------------+ Mid UA      117 / 362     1.03        0.80    competing branch and slight                                                    change in Diameter       +------------+----------+-------------+----------+-----------------------------+ Dist UA        322        1.04        0.43   Retained valve and competing                                                        branch m-d           +------------+----------+-------------+----------+-----------------------------+ AC Fossa       471        1.17        0.36                                 +------------+----------+-------------+----------+-----------------------------+ slight tortuosity mid to distal outflow vein,  Summary: Patent left Brachiocephalic fistula , see above.   *See table(s) above for measurements and observations.   Diagnosing physician: Deitra Mayo MD   Incision: Her incision is well-healed.  Fistula is palpable with good thrill and bruit. Extremities: Mild pallor to the distal second third and fourth fingers on the left.  Palpable radial pulse.  5 out of 5 grip strength Neuro: Alert and  oriented x4   Assessment/Plan:  This is a 54 y.o. female who is s/p: Left brachiocephalic fistula on August 07, 2019.  I reviewed the results of her duplex study and her current physical exam with Dr. Oneida Alar today.  I went over these results with the patient.  We described fistula creation and diverting a small amount of blood from her hand.  Her symptoms are bothersome; however, they do not impact her activities of daily living and she understands if her symptoms worsen, her fistula will need to be ligated and new access plan developed.  She is instructed to call our office for any motor dysfunction, hand pain or skin changes.  Okay to access her fistula.  Catheter removal as per [facility]    Jannet Mantis, PA-C [facility] [phone]  Clinic MD: Oneida Alar

## 2019-10-17 NOTE — Telephone Encounter (Signed)

## 2019-10-18 ENCOUNTER — Ambulatory Visit (HOSPITAL_COMMUNITY)
Admission: RE | Admit: 2019-10-18 | Discharge: 2019-10-18 | Disposition: A | Discharge status: Home / Self Care | Payer: Medicare Other | Source: Ambulatory Visit | Attending: Vascular Surgery | Admitting: Vascular Surgery

## 2019-10-18 ENCOUNTER — Ambulatory Visit (INDEPENDENT_AMBULATORY_CARE_PROVIDER_SITE_OTHER): Payer: Self-pay | Admitting: Physician Assistant

## 2019-10-18 ENCOUNTER — Other Ambulatory Visit: Payer: Self-pay

## 2019-10-18 VITALS — BP 125/88 | HR 66 | Temp 97.1°F | Resp 18 | Ht 65.0 in | Wt 192.0 lb

## 2019-10-18 DIAGNOSIS — Z992 Dependence on renal dialysis: Secondary | ICD-10-CM | POA: Insufficient documentation

## 2019-10-18 DIAGNOSIS — N186 End stage renal disease: Secondary | ICD-10-CM | POA: Diagnosis not present

## 2019-10-19 DIAGNOSIS — N186 End stage renal disease: Secondary | ICD-10-CM | POA: Diagnosis not present

## 2019-10-19 DIAGNOSIS — Z992 Dependence on renal dialysis: Secondary | ICD-10-CM | POA: Diagnosis not present

## 2019-10-24 DIAGNOSIS — Z992 Dependence on renal dialysis: Secondary | ICD-10-CM | POA: Diagnosis not present

## 2019-10-24 DIAGNOSIS — N186 End stage renal disease: Secondary | ICD-10-CM | POA: Diagnosis not present

## 2019-10-29 DIAGNOSIS — N186 End stage renal disease: Secondary | ICD-10-CM | POA: Diagnosis not present

## 2019-10-29 DIAGNOSIS — Z992 Dependence on renal dialysis: Secondary | ICD-10-CM | POA: Diagnosis not present

## 2019-10-31 DIAGNOSIS — N186 End stage renal disease: Secondary | ICD-10-CM | POA: Diagnosis not present

## 2019-10-31 DIAGNOSIS — Z992 Dependence on renal dialysis: Secondary | ICD-10-CM | POA: Diagnosis not present

## 2019-11-06 DIAGNOSIS — N186 End stage renal disease: Secondary | ICD-10-CM | POA: Diagnosis not present

## 2019-11-06 DIAGNOSIS — N2581 Secondary hyperparathyroidism of renal origin: Secondary | ICD-10-CM | POA: Diagnosis not present

## 2019-11-06 DIAGNOSIS — G4700 Insomnia, unspecified: Secondary | ICD-10-CM | POA: Diagnosis not present

## 2019-11-06 DIAGNOSIS — G894 Chronic pain syndrome: Secondary | ICD-10-CM | POA: Diagnosis not present

## 2019-11-06 DIAGNOSIS — Z992 Dependence on renal dialysis: Secondary | ICD-10-CM | POA: Diagnosis not present

## 2019-11-13 DIAGNOSIS — N186 End stage renal disease: Secondary | ICD-10-CM | POA: Diagnosis not present

## 2019-11-13 DIAGNOSIS — Z992 Dependence on renal dialysis: Secondary | ICD-10-CM | POA: Diagnosis not present

## 2019-11-17 DIAGNOSIS — N186 End stage renal disease: Secondary | ICD-10-CM | POA: Diagnosis not present

## 2019-11-17 DIAGNOSIS — Z992 Dependence on renal dialysis: Secondary | ICD-10-CM | POA: Diagnosis not present

## 2019-11-24 DIAGNOSIS — N186 End stage renal disease: Secondary | ICD-10-CM | POA: Diagnosis not present

## 2019-11-24 DIAGNOSIS — Z992 Dependence on renal dialysis: Secondary | ICD-10-CM | POA: Diagnosis not present

## 2019-11-27 DIAGNOSIS — N186 End stage renal disease: Secondary | ICD-10-CM | POA: Diagnosis not present

## 2019-11-27 DIAGNOSIS — Z992 Dependence on renal dialysis: Secondary | ICD-10-CM | POA: Diagnosis not present

## 2019-12-01 DIAGNOSIS — Z992 Dependence on renal dialysis: Secondary | ICD-10-CM | POA: Diagnosis not present

## 2019-12-01 DIAGNOSIS — N186 End stage renal disease: Secondary | ICD-10-CM | POA: Diagnosis not present

## 2019-12-05 DIAGNOSIS — G894 Chronic pain syndrome: Secondary | ICD-10-CM | POA: Diagnosis not present

## 2019-12-05 DIAGNOSIS — J329 Chronic sinusitis, unspecified: Secondary | ICD-10-CM | POA: Diagnosis not present

## 2019-12-05 DIAGNOSIS — I10 Essential (primary) hypertension: Secondary | ICD-10-CM | POA: Diagnosis not present

## 2019-12-05 DIAGNOSIS — G4700 Insomnia, unspecified: Secondary | ICD-10-CM | POA: Diagnosis not present

## 2019-12-05 IMAGING — DX DG CHEST 2V
2 series · 2 of 2 positions shown · non-contrast
Comparison: CTA chest dated January 26, 2018

CLINICAL DATA: Left chest pain

EXAM:
CHEST - 2 VIEW

[chest pa]
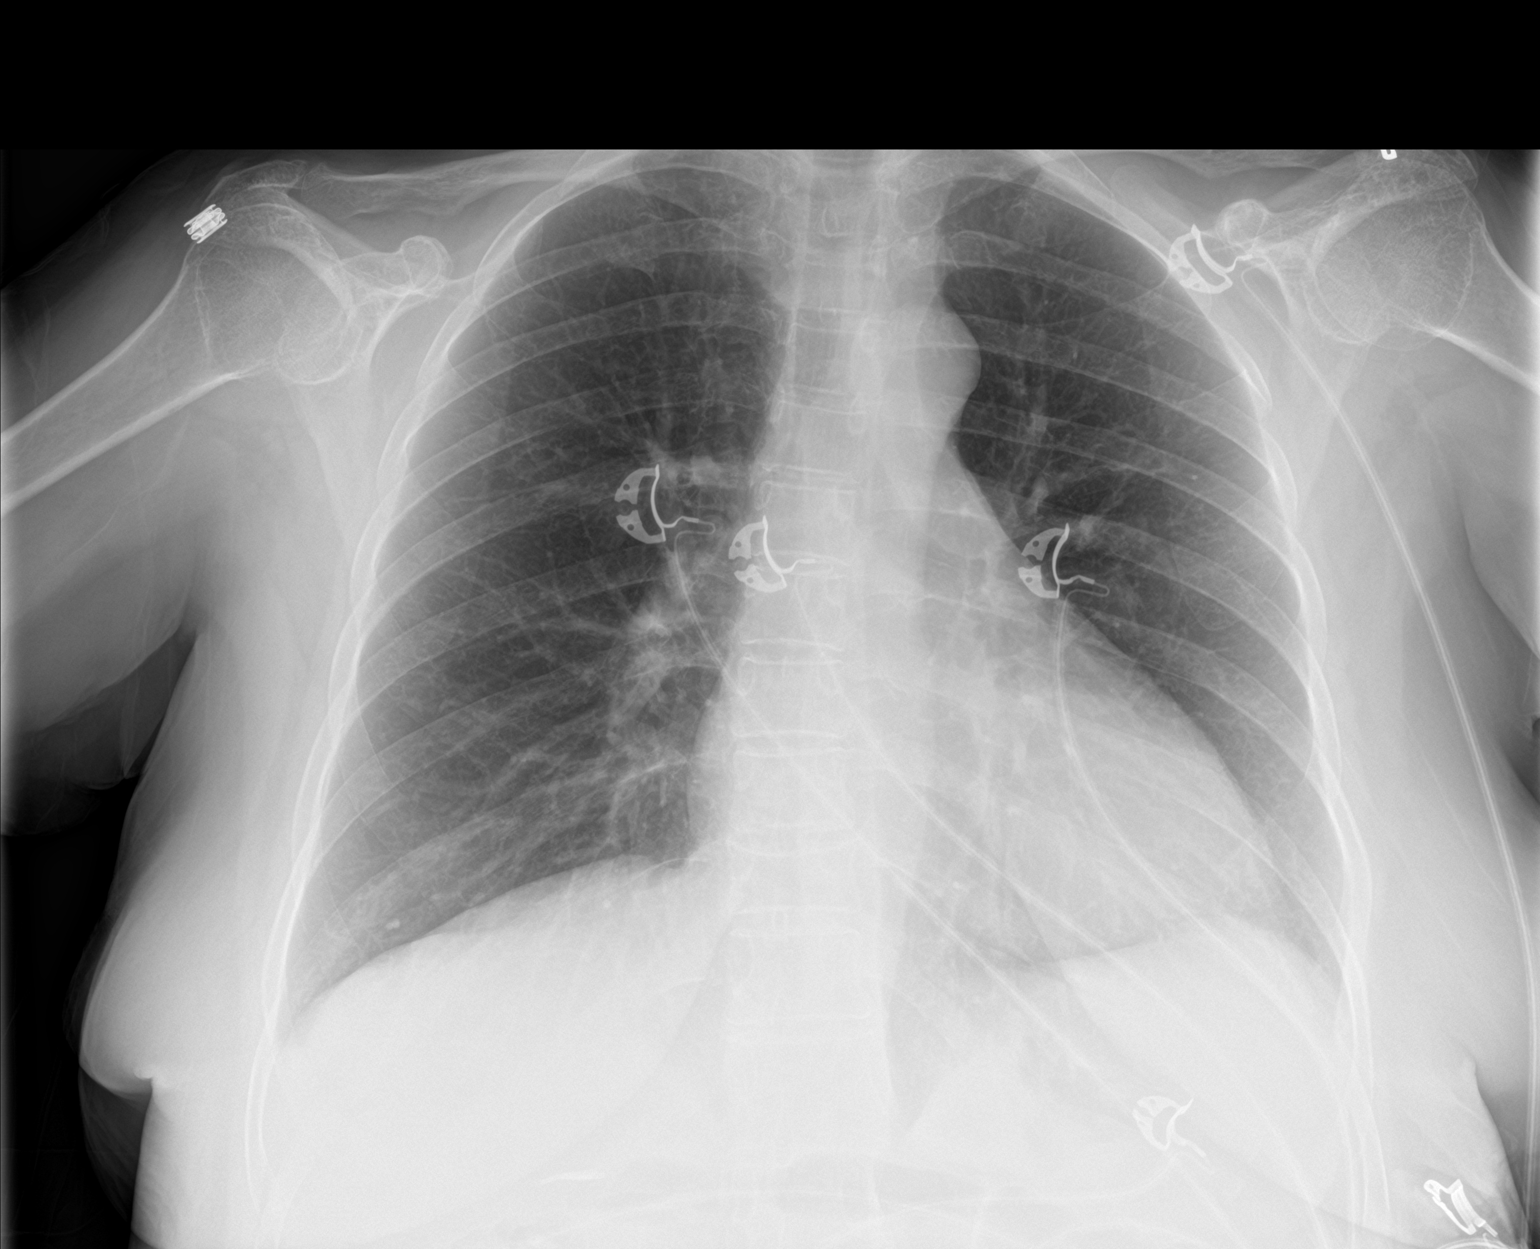

[chest lat]
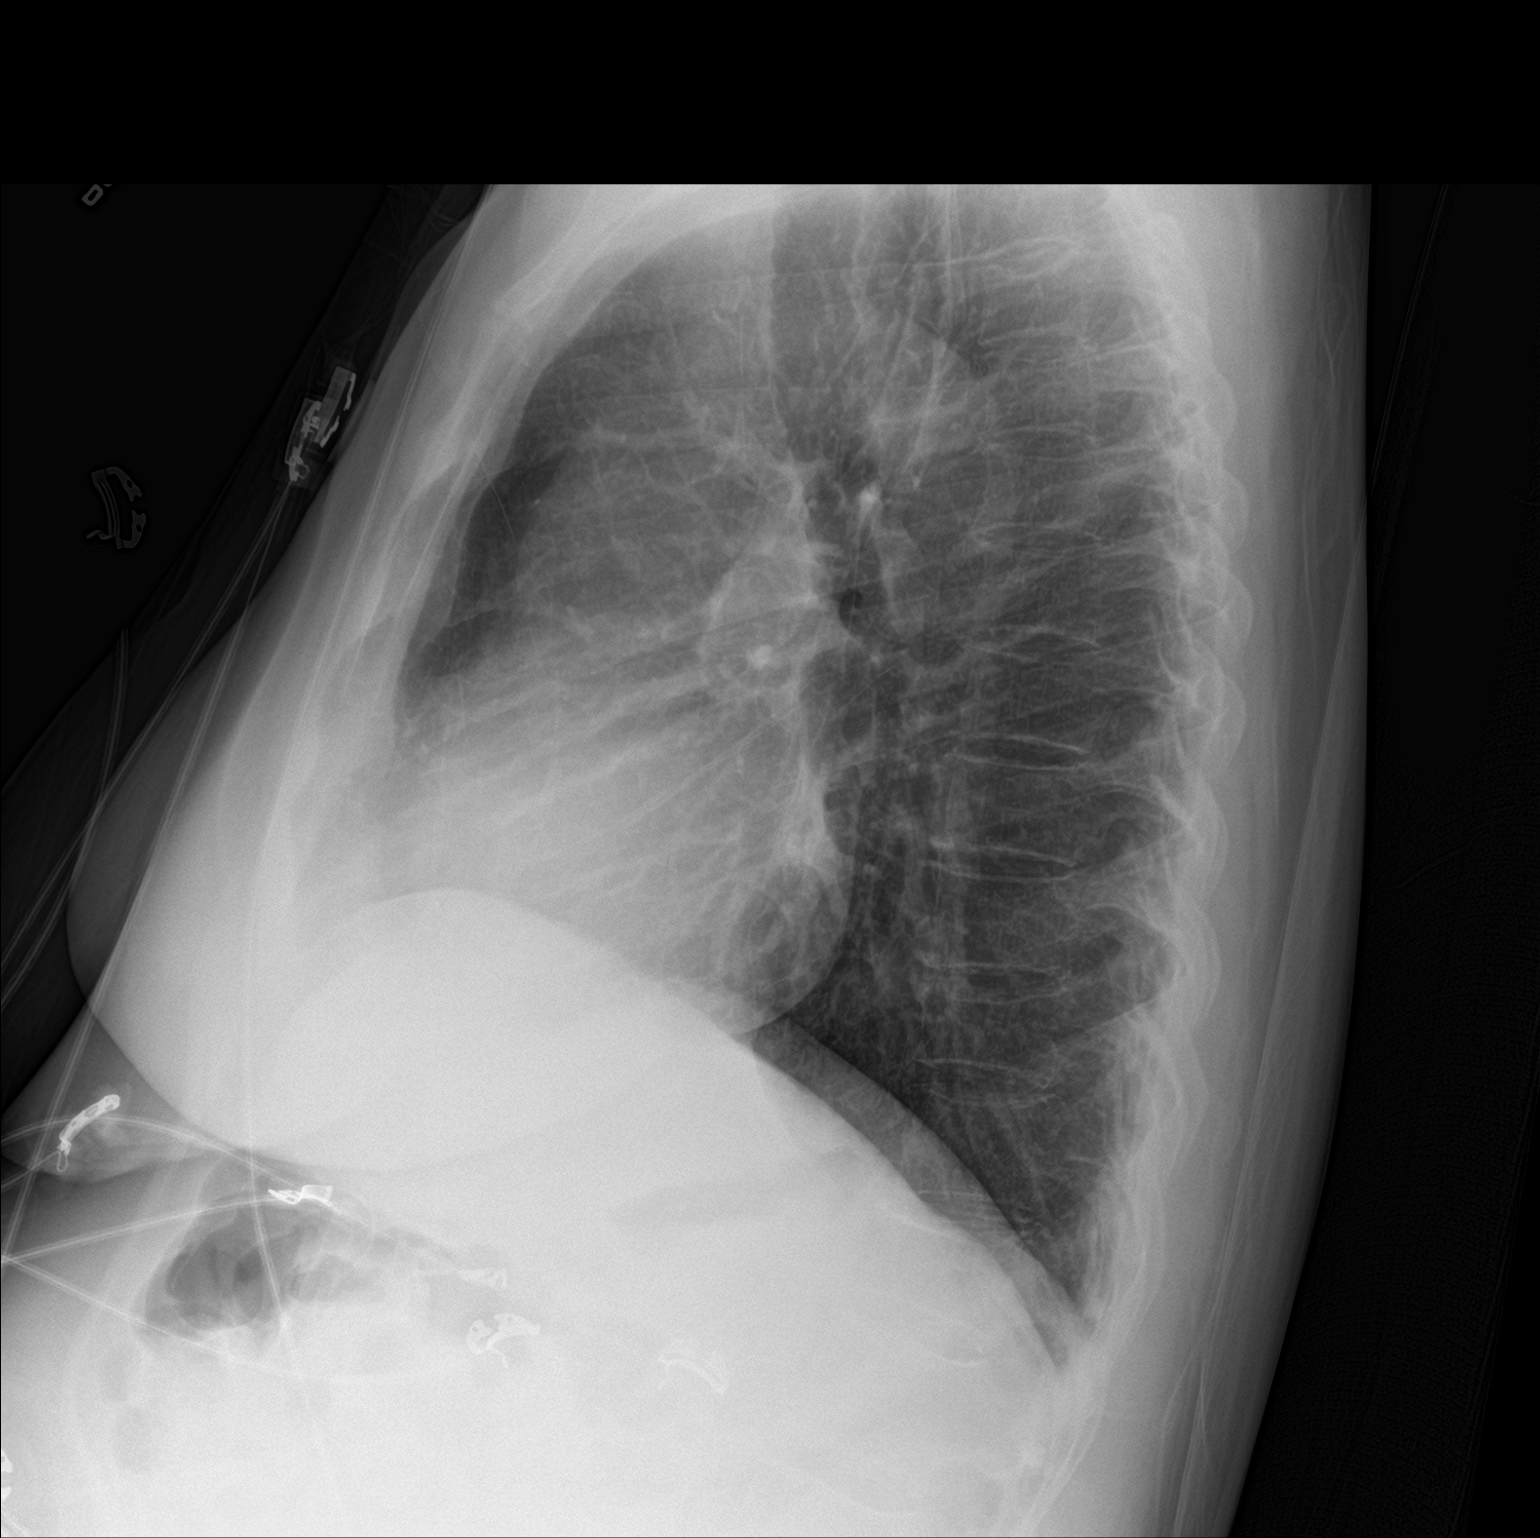

[2 of 2 positions shown; findings below may reference images not displayed]

FINDINGS: Lungs are clear.  No pleural effusion or pneumothorax.

The heart is normal in size.

Stable mild superior endplate compression fracture deformity of two
lower thoracic vertebral bodies, chronic.
IMPRESSION: No evidence of acute cardiopulmonary disease.

## 2019-12-08 DIAGNOSIS — N186 End stage renal disease: Secondary | ICD-10-CM | POA: Diagnosis not present

## 2019-12-08 DIAGNOSIS — Z992 Dependence on renal dialysis: Secondary | ICD-10-CM | POA: Diagnosis not present

## 2019-12-15 DIAGNOSIS — Z992 Dependence on renal dialysis: Secondary | ICD-10-CM | POA: Diagnosis not present

## 2019-12-15 DIAGNOSIS — N186 End stage renal disease: Secondary | ICD-10-CM | POA: Diagnosis not present

## 2019-12-22 DIAGNOSIS — N2581 Secondary hyperparathyroidism of renal origin: Secondary | ICD-10-CM | POA: Diagnosis not present

## 2019-12-22 DIAGNOSIS — N186 End stage renal disease: Secondary | ICD-10-CM | POA: Diagnosis not present

## 2019-12-22 DIAGNOSIS — Z992 Dependence on renal dialysis: Secondary | ICD-10-CM | POA: Diagnosis not present

## 2019-12-27 DIAGNOSIS — N186 End stage renal disease: Secondary | ICD-10-CM | POA: Diagnosis not present

## 2019-12-27 DIAGNOSIS — Z992 Dependence on renal dialysis: Secondary | ICD-10-CM | POA: Diagnosis not present

## 2019-12-29 DIAGNOSIS — N186 End stage renal disease: Secondary | ICD-10-CM | POA: Diagnosis not present

## 2019-12-29 DIAGNOSIS — Z992 Dependence on renal dialysis: Secondary | ICD-10-CM | POA: Diagnosis not present

## 2020-01-03 DIAGNOSIS — E119 Type 2 diabetes mellitus without complications: Secondary | ICD-10-CM | POA: Diagnosis not present

## 2020-01-03 DIAGNOSIS — Z992 Dependence on renal dialysis: Secondary | ICD-10-CM | POA: Diagnosis not present

## 2020-01-03 DIAGNOSIS — N2581 Secondary hyperparathyroidism of renal origin: Secondary | ICD-10-CM | POA: Diagnosis not present

## 2020-01-03 DIAGNOSIS — D509 Iron deficiency anemia, unspecified: Secondary | ICD-10-CM | POA: Diagnosis not present

## 2020-01-05 DIAGNOSIS — N186 End stage renal disease: Secondary | ICD-10-CM | POA: Diagnosis not present

## 2020-01-05 DIAGNOSIS — Z992 Dependence on renal dialysis: Secondary | ICD-10-CM | POA: Diagnosis not present

## 2020-01-08 DIAGNOSIS — N185 Chronic kidney disease, stage 5: Secondary | ICD-10-CM | POA: Diagnosis not present

## 2020-01-08 DIAGNOSIS — G894 Chronic pain syndrome: Secondary | ICD-10-CM | POA: Diagnosis not present

## 2020-01-13 DIAGNOSIS — N186 End stage renal disease: Secondary | ICD-10-CM | POA: Diagnosis not present

## 2020-01-13 DIAGNOSIS — Z992 Dependence on renal dialysis: Secondary | ICD-10-CM | POA: Diagnosis not present

## 2020-01-20 DIAGNOSIS — N186 End stage renal disease: Secondary | ICD-10-CM | POA: Diagnosis not present

## 2020-01-20 DIAGNOSIS — Z992 Dependence on renal dialysis: Secondary | ICD-10-CM | POA: Diagnosis not present

## 2020-01-25 DIAGNOSIS — N2581 Secondary hyperparathyroidism of renal origin: Secondary | ICD-10-CM | POA: Diagnosis not present

## 2020-01-25 DIAGNOSIS — N186 End stage renal disease: Secondary | ICD-10-CM | POA: Diagnosis not present

## 2020-01-25 DIAGNOSIS — Z992 Dependence on renal dialysis: Secondary | ICD-10-CM | POA: Diagnosis not present

## 2020-01-26 DIAGNOSIS — N186 End stage renal disease: Secondary | ICD-10-CM | POA: Diagnosis not present

## 2020-01-26 DIAGNOSIS — Z992 Dependence on renal dialysis: Secondary | ICD-10-CM | POA: Diagnosis not present

## 2020-01-30 DIAGNOSIS — Z992 Dependence on renal dialysis: Secondary | ICD-10-CM | POA: Diagnosis not present

## 2020-01-30 DIAGNOSIS — N186 End stage renal disease: Secondary | ICD-10-CM | POA: Diagnosis not present

## 2020-02-03 DIAGNOSIS — Z992 Dependence on renal dialysis: Secondary | ICD-10-CM | POA: Diagnosis not present

## 2020-02-03 DIAGNOSIS — N186 End stage renal disease: Secondary | ICD-10-CM | POA: Diagnosis not present

## 2020-02-05 ENCOUNTER — Telehealth: Payer: Self-pay

## 2020-02-05 DIAGNOSIS — G894 Chronic pain syndrome: Secondary | ICD-10-CM | POA: Diagnosis not present

## 2020-02-05 DIAGNOSIS — N185 Chronic kidney disease, stage 5: Secondary | ICD-10-CM | POA: Diagnosis not present

## 2020-02-05 DIAGNOSIS — J329 Chronic sinusitis, unspecified: Secondary | ICD-10-CM | POA: Diagnosis not present

## 2020-02-05 NOTE — Telephone Encounter (Signed)
Pt called and said that she would like to make an appt to see Dr Oneida Alar because she said that she has had some discomfort in her access not only where they are sticking her but overall.   Pt not having numbness or steal symptoms as she said that she is well aware of what those feel like because she has been treated for this before.   She states that she would just like to have it reversed and taken out. Discussed with patient that this could not just be done because she wanted it to. Advised her that she would need to discuss with her kidney dr/ dialysis her concerns and if they felt that surgery needed to be done, they would contact our office. Advised her that she needs to have some access for dialysis and a catheter is not meant to be permanent as it causes risk for infection.   She will speak with her kidney dr and will see what they feel would be the best option for her.   York Cerise, CMA

## 2020-02-07 ENCOUNTER — Observation Stay (HOSPITAL_COMMUNITY)
Admission: EM | Admit: 2020-02-07 | Discharge: 2020-02-08 | Disposition: A | Discharge status: Home / Self Care | Payer: Medicare Other | Attending: Internal Medicine | Admitting: Internal Medicine

## 2020-02-07 ENCOUNTER — Encounter (HOSPITAL_COMMUNITY): Payer: Self-pay | Admitting: Emergency Medicine

## 2020-02-07 ENCOUNTER — Other Ambulatory Visit: Payer: Self-pay

## 2020-02-07 ENCOUNTER — Emergency Department (HOSPITAL_COMMUNITY): Payer: Medicare Other

## 2020-02-07 DIAGNOSIS — Z20822 Contact with and (suspected) exposure to covid-19: Secondary | ICD-10-CM | POA: Diagnosis not present

## 2020-02-07 DIAGNOSIS — Z79899 Other long term (current) drug therapy: Secondary | ICD-10-CM | POA: Insufficient documentation

## 2020-02-07 DIAGNOSIS — N186 End stage renal disease: Secondary | ICD-10-CM | POA: Diagnosis not present

## 2020-02-07 DIAGNOSIS — E1122 Type 2 diabetes mellitus with diabetic chronic kidney disease: Secondary | ICD-10-CM | POA: Insufficient documentation

## 2020-02-07 DIAGNOSIS — E782 Mixed hyperlipidemia: Secondary | ICD-10-CM | POA: Diagnosis not present

## 2020-02-07 DIAGNOSIS — Z87891 Personal history of nicotine dependence: Secondary | ICD-10-CM | POA: Insufficient documentation

## 2020-02-07 DIAGNOSIS — I252 Old myocardial infarction: Secondary | ICD-10-CM | POA: Diagnosis not present

## 2020-02-07 DIAGNOSIS — I5043 Acute on chronic combined systolic (congestive) and diastolic (congestive) heart failure: Secondary | ICD-10-CM | POA: Insufficient documentation

## 2020-02-07 DIAGNOSIS — I2510 Atherosclerotic heart disease of native coronary artery without angina pectoris: Secondary | ICD-10-CM | POA: Diagnosis not present

## 2020-02-07 DIAGNOSIS — J810 Acute pulmonary edema: Secondary | ICD-10-CM | POA: Diagnosis not present

## 2020-02-07 DIAGNOSIS — I132 Hypertensive heart and chronic kidney disease with heart failure and with stage 5 chronic kidney disease, or end stage renal disease: Secondary | ICD-10-CM | POA: Insufficient documentation

## 2020-02-07 DIAGNOSIS — Z992 Dependence on renal dialysis: Secondary | ICD-10-CM | POA: Insufficient documentation

## 2020-02-07 DIAGNOSIS — I10 Essential (primary) hypertension: Secondary | ICD-10-CM | POA: Diagnosis not present

## 2020-02-07 DIAGNOSIS — J9601 Acute respiratory failure with hypoxia: Secondary | ICD-10-CM | POA: Diagnosis not present

## 2020-02-07 DIAGNOSIS — D693 Immune thrombocytopenic purpura: Secondary | ICD-10-CM | POA: Diagnosis not present

## 2020-02-07 DIAGNOSIS — R0602 Shortness of breath: Secondary | ICD-10-CM | POA: Diagnosis not present

## 2020-02-07 DIAGNOSIS — R05 Cough: Secondary | ICD-10-CM | POA: Diagnosis not present

## 2020-02-07 LAB — CBC WITH DIFFERENTIAL/PLATELET
Abs Immature Granulocytes: 0.02 10*3/uL (ref 0.00–0.07)
Basophils Absolute: 0 10*3/uL (ref 0.0–0.1)
Basophils Relative: 1 %
Eosinophils Absolute: 0 10*3/uL (ref 0.0–0.5)
Eosinophils Relative: 1 %
HCT: 32.6 % — ABNORMAL LOW (ref 36.0–46.0)
Hemoglobin: 10.1 g/dL — ABNORMAL LOW (ref 12.0–15.0)
Immature Granulocytes: 0 %
Lymphocytes Relative: 22 %
Lymphs Abs: 1.9 10*3/uL (ref 0.7–4.0)
MCH: 29.7 pg (ref 26.0–34.0)
MCHC: 31 g/dL (ref 30.0–36.0)
MCV: 95.9 fL (ref 80.0–100.0)
Monocytes Absolute: 0.6 10*3/uL (ref 0.1–1.0)
Monocytes Relative: 7 %
Neutro Abs: 5.9 10*3/uL (ref 1.7–7.7)
Neutrophils Relative %: 69 %
Platelets: 218 10*3/uL (ref 150–400)
RBC: 3.4 MIL/uL — ABNORMAL LOW (ref 3.87–5.11)
RDW: 15 % (ref 11.5–15.5)
WBC: 8.4 10*3/uL (ref 4.0–10.5)
nRBC: 0 % (ref 0.0–0.2)

## 2020-02-07 LAB — COMPREHENSIVE METABOLIC PANEL
ALT: 21 U/L (ref 0–44)
AST: 25 U/L (ref 15–41)
Albumin: 3.3 g/dL — ABNORMAL LOW (ref 3.5–5.0)
Alkaline Phosphatase: 70 U/L (ref 38–126)
Anion gap: 14 (ref 5–15)
BUN: 78 mg/dL — ABNORMAL HIGH (ref 6–20)
CO2: 22 mmol/L (ref 22–32)
Calcium: 7.6 mg/dL — ABNORMAL LOW (ref 8.9–10.3)
Chloride: 101 mmol/L (ref 98–111)
Creatinine, Ser: 6.2 mg/dL — ABNORMAL HIGH (ref 0.44–1.00)
GFR calc Af Amer: 8 mL/min — ABNORMAL LOW (ref >60)
GFR calc non Af Amer: 7 mL/min — ABNORMAL LOW (ref >60)
Glucose, Bld: 91 mg/dL (ref 70–99)
Potassium: 3.8 mmol/L (ref 3.5–5.1)
Sodium: 137 mmol/L (ref 135–145)
Total Bilirubin: 0.6 mg/dL (ref 0.3–1.2)
Total Protein: 6.7 g/dL (ref 6.5–8.1)

## 2020-02-07 LAB — HIV ANTIBODY (ROUTINE TESTING W REFLEX): HIV Screen 4th Generation wRfx: NONREACTIVE

## 2020-02-07 LAB — SARS CORONAVIRUS 2 BY RT PCR (HOSPITAL ORDER, PERFORMED IN ~~LOC~~ HOSPITAL LAB): SARS Coronavirus 2: NEGATIVE

## 2020-02-07 LAB — BRAIN NATRIURETIC PEPTIDE: B Natriuretic Peptide: 2062 pg/mL — ABNORMAL HIGH (ref 0.0–100.0)

## 2020-02-07 LAB — MAGNESIUM: Magnesium: 2.6 mg/dL — ABNORMAL HIGH (ref 1.7–2.4)

## 2020-02-07 LAB — PHOSPHORUS: Phosphorus: 6.8 mg/dL — ABNORMAL HIGH (ref 2.5–4.6)

## 2020-02-07 LAB — MRSA PCR SCREENING: MRSA by PCR: NEGATIVE

## 2020-02-07 MED ORDER — HYDRALAZINE HCL 25 MG PO TABS
50.0000 mg | ORAL_TABLET | Freq: Two times a day (BID) | ORAL | Status: DC
  Administered 2020-02-07: 50 mg via ORAL
  Filled 2020-02-07 (×2): qty 2

## 2020-02-07 MED ORDER — IPRATROPIUM-ALBUTEROL 0.5-2.5 (3) MG/3ML IN SOLN
3.0000 mL | Freq: Three times a day (TID) | RESPIRATORY_TRACT | Status: DC
  Administered 2020-02-07: 3 mL via RESPIRATORY_TRACT
  Filled 2020-02-07: qty 3

## 2020-02-07 MED ORDER — CHLORHEXIDINE GLUCONATE CLOTH 2 % EX PADS
6.0000 | MEDICATED_PAD | Freq: Every day | CUTANEOUS | Status: DC
  Administered 2020-02-08: 6 via TOPICAL

## 2020-02-07 MED ORDER — ONDANSETRON HCL 4 MG/2ML IJ SOLN: 4.0000 mg | Freq: Four times a day (QID) | INTRAMUSCULAR | Status: DC | PRN

## 2020-02-07 MED ORDER — ALBUTEROL SULFATE (2.5 MG/3ML) 0.083% IN NEBU: 2.5000 mg | INHALATION_SOLUTION | RESPIRATORY_TRACT | Status: DC | PRN

## 2020-02-07 MED ORDER — ISOSORBIDE DINITRATE 10 MG PO TABS
10.0000 mg | ORAL_TABLET | Freq: Two times a day (BID) | ORAL | Status: DC
  Administered 2020-02-07: 10 mg via ORAL
  Filled 2020-02-07 (×4): qty 1

## 2020-02-07 MED ORDER — HEPARIN SOD (PORK) LOCK FLUSH 100 UNIT/ML IV SOLN
500.0000 [IU] | Freq: Once | INTRAVENOUS | Status: AC
  Administered 2020-02-07: 500 [IU] via INTRAVENOUS
  Filled 2020-02-07: qty 5

## 2020-02-07 MED ORDER — ACETAMINOPHEN 325 MG PO TABS: 650.0000 mg | ORAL_TABLET | Freq: Four times a day (QID) | ORAL | Status: DC | PRN

## 2020-02-07 MED ORDER — IPRATROPIUM-ALBUTEROL 0.5-2.5 (3) MG/3ML IN SOLN
3.0000 mL | Freq: Three times a day (TID) | RESPIRATORY_TRACT | Status: DC
  Administered 2020-02-08: 3 mL via RESPIRATORY_TRACT
  Filled 2020-02-07: qty 3

## 2020-02-07 MED ORDER — VENLAFAXINE HCL 37.5 MG PO TABS
37.5000 mg | ORAL_TABLET | Freq: Two times a day (BID) | ORAL | Status: DC
  Administered 2020-02-07 – 2020-02-08 (×2): 37.5 mg via ORAL
  Filled 2020-02-07 (×2): qty 1

## 2020-02-07 MED ORDER — FUROSEMIDE 80 MG PO TABS
80.0000 mg | ORAL_TABLET | Freq: Every day | ORAL | Status: DC
  Administered 2020-02-07: 80 mg via ORAL
  Filled 2020-02-07: qty 2
  Filled 2020-02-07: qty 1

## 2020-02-07 MED ORDER — LIDOCAINE-PRILOCAINE 2.5-2.5 % EX CREA: 1.0000 "application " | TOPICAL_CREAM | CUTANEOUS | Status: DC | PRN

## 2020-02-07 MED ORDER — CALCIUM ACETATE (PHOS BINDER) 667 MG PO CAPS: 1334.0000 mg | ORAL_CAPSULE | Freq: Two times a day (BID) | ORAL | Status: DC | PRN

## 2020-02-07 MED ORDER — HEPARIN SODIUM (PORCINE) 5000 UNIT/ML IJ SOLN
5000.0000 [IU] | Freq: Three times a day (TID) | INTRAMUSCULAR | Status: DC
  Administered 2020-02-07 – 2020-02-08 (×2): 5000 [IU] via SUBCUTANEOUS
  Filled 2020-02-07 (×2): qty 1

## 2020-02-07 MED ORDER — METOPROLOL SUCCINATE ER 50 MG PO TB24
50.0000 mg | ORAL_TABLET | Freq: Every day | ORAL | Status: DC
  Administered 2020-02-07: 50 mg via ORAL
  Filled 2020-02-07: qty 1
  Filled 2020-02-07: qty 2

## 2020-02-07 MED ORDER — ONDANSETRON HCL 4 MG PO TABS: 4.0000 mg | ORAL_TABLET | Freq: Four times a day (QID) | ORAL | Status: DC | PRN

## 2020-02-07 MED ORDER — LORATADINE 10 MG PO TABS: 10.0000 mg | ORAL_TABLET | Freq: Every day | ORAL | Status: DC | PRN

## 2020-02-07 MED ORDER — SERTRALINE HCL 50 MG PO TABS
100.0000 mg | ORAL_TABLET | Freq: Every day | ORAL | Status: DC
  Administered 2020-02-07 – 2020-02-08 (×2): 100 mg via ORAL
  Filled 2020-02-07 (×2): qty 2

## 2020-02-07 MED ORDER — CALCIUM ACETATE (PHOS BINDER) 667 MG PO CAPS
1334.0000 mg | ORAL_CAPSULE | Freq: Three times a day (TID) | ORAL | Status: DC
  Filled 2020-02-07 (×2): qty 2

## 2020-02-07 MED ORDER — RENA-VITE PO TABS
1.0000 | ORAL_TABLET | Freq: Every day | ORAL | Status: DC
  Filled 2020-02-07: qty 1

## 2020-02-07 MED ORDER — ACETAMINOPHEN 650 MG RE SUPP: 650.0000 mg | Freq: Four times a day (QID) | RECTAL | Status: DC | PRN

## 2020-02-07 NOTE — Care Management Obs Status (Signed)
[place] [place] NOTIFICATION   Patient Details  Name: Summer Cook MRN: [id number] Date of Birth: Nov 21, 1965   Medicare Observation Status Notification Given:  Yes    Sherie Don, LCSW 02/07/2020, 4:41 PM

## 2020-02-07 NOTE — Plan of Care (Signed)

## 2020-02-07 NOTE — ED Triage Notes (Addendum)
Pt reports shortness of breath, fluid retention, and cough since this am.pt reports did not go to dialysis yesterday. Pt denies pain. Pt reports received first covid vaccine on Sunday.

## 2020-02-07 NOTE — ED Provider Notes (Signed)
Emergency Department Provider Note   I have reviewed the triage vital signs and the nursing notes.   HISTORY  Chief Complaint Shortness of Breath   HPI Summer Cook is a 54 y.o. female with past medical history reviewed below including CAD, ESRD with TRS HD presents to the emergency department for evaluation of shortness of breath.  Symptoms began last night without chest pain.  She reports some mild cough but no fevers.  She feels as if fluid has built up in her legs but also her lungs.  She did not go to dialysis yesterday because she was feeling very well and did not feel that she needed it.  No radiation of symptoms or other modifying factors noted by patient.  She called her dialysis center this morning to see if they could fit her in but they did not have space and she was referred to the emergency department.   Past Medical History:  Diagnosis Date  . Allergy   . Anxiety   . Arthritis   . Asthma   . CAD (coronary artery disease), native coronary artery    a. cath 05/20/17 - 100% distal PDA --> medical mangement (no intervention done)  . Candida esophagitis ([place])   . Chronic combined systolic and diastolic CHF (congestive heart failure) ([place])   . Depression   . End stage renal disease on dialysis [facility])    T/ Th/ Sat starting 4/28- [facility]  . Essential hypertension   . GERD (gastroesophageal reflux disease)   . Gout   . Headache   . Hemorrhoids   . History of kidney stones    passed  . 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  Last was Prednisone '40mg'$ ; 2 wk in 10/2012.  She also was on Prednisone bridged with Cellcept briefly but stopped due to lack of insurance.   . Myocardial infarction ([place])   . Perinephric hematoma   . Pneumonia   . Pulmonary embolism ([place]) 04/2012  . Serrated adenoma of colon   . Steroid-induced diabetes ([place])    "when taking  Prednisone"  . Stroke ([place])   . Thrush, oral 05/29/2011    Patient Active Problem List   Diagnosis Date Noted  . Acute pulmonary edema (HCC)   . SOB (shortness of breath)   . ESRD (end stage renal disease) ([place]) 03/09/2019  . Acute renal failure superimposed on stage 3 chronic kidney disease ([place]) 01/11/2019  . Renal hematoma, left   . Acute on chronic systolic congestive heart failure ([place])   . Iron deficiency anemia   . Fever 01/07/2019  . CKD (chronic kidney disease), stage V ([place]) 01/07/2019  . Cough 01/07/2019  . AKI (acute kidney injury) ([place]) 12/31/2018  . Chest pain 04/29/2018  . Unstable angina ([place]) 05/29/2017  . CAD (coronary artery disease): total distal RCA EF normal (05/20/17) 05/25/2017  . UTI (urinary tract infection) 05/25/2017  . Chest pain with moderate risk for cardiac etiology 05/23/2017  . Mixed hyperlipidemia   . NSTEMI (non-ST elevated myocardial infarction) ([place]) 05/20/2017  . Diabetes mellitus type 2, uncomplicated ([place]) 28/31/5176  . History of stroke   . Chronic back pain 12/23/2015  . Hypotension 02/16/2015  . Asthma, chronic 02/16/2015  . Anxiety state 02/16/2015  . Stage 3 chronic kidney disease ([place]) 04/28/2014  . Nausea with vomiting 01/25/2013  . Abnormal LFTs 01/25/2013  . Orthostatic hypotension 01/25/2013  . Rectal bleeding 12/26/2012  .  History of pulmonary embolism 05/03/2012  . Acute respiratory failure with hypoxia ([place]) 06/05/2011  . Hemorrhoids 06/04/2011  . 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 Immunosuppressed status ([place]) 06/04/2011  . Depression 06/04/2011  . Obesity 06/04/2011  . Essential hypertension 03/18/2011  . Idiopathic thrombocytopenic purpura ([place]) 03/18/2011    Past Surgical History:  Procedure Laterality Date  . AV FISTULA PLACEMENT Right 01/23/2019   Procedure: ARTERIOVENOUS (AV) FISTULA CREATION RIGHT ARM;  Surgeon: Elam Dutch, MD;  Location: [place];  Service: Vascular;  Laterality: Right;  . AV FISTULA  PLACEMENT Right 01/30/2019   Procedure: GORETEX GRAFT PLACEMENT  RIGHT ARM;  Surgeon: Elam Dutch, MD;  Location: [facility];  Service: Vascular;  Laterality: Right;  . AV FISTULA PLACEMENT Left 08/07/2019   Procedure: LEFT BRACHIOCEPHALIC VENOUS FISTULA;  Surgeon: Elam Dutch, MD;  Location: [place];  Service: Vascular;  Laterality: Left;  . BONE MARROW BIOPSY    . CARDIAC CATHETERIZATION    . CARPAL TUNNEL RELEASE    . CARPAL TUNNEL RELEASE Left 05/20/2016   Procedure: CARPAL TUNNEL RELEASE;  Surgeon: Carole Civil, MD;  Location: [facility];  Service: Orthopedics;  Laterality: Left;  . CARPAL TUNNEL RELEASE Right 07/16/2016   Procedure: RIGHT CARPAL TUNNEL RELEASE;  Surgeon: Carole Civil, MD;  Location: [facility];  Service: Orthopedics;  Laterality: Right;  . CATARACT EXTRACTION    . CHOLECYSTECTOMY  2008  . COLONOSCOPY WITH ESOPHAGOGASTRODUODENOSCOPY (EGD) N/A 01/04/2013   Dr. Gala Romney: esophageal plaques with +KOH, hh. Gastric antrum abnormal, bx reactive gastropathy. Anal canal hemorrhoids, colonic tics, normal TI, single polyp (sessile serrated tubular adenoma). Next TCS 12/2017  . IR FLUORO GUIDE CV LINE RIGHT  01/10/2019  . IR US GUIDE VASC ACCESS RIGHT  01/10/2019  . LEFT HEART CATH AND CORONARY ANGIOGRAPHY N/A 05/20/2017   Procedure: LEFT HEART CATH AND CORONARY ANGIOGRAPHY;  Surgeon: Martinique, Peter M, MD;  Location: [facility];  Service: Cardiovascular;  Laterality: N/A;  . 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 TEE WITHOUT CARDIOVERSION N/A 01/03/2016   Procedure: TRANSESOPHAGEAL ECHOCARDIOGRAM (TEE) WITH PROPOFOL;  Surgeon: Satira Sark, MD;  [facility];  Service: Cardiovascular;  Laterality: N/A;    Allergies Ciprofloxacin, Ceftriaxone, Dilaudid [hydromorphone hcl], Yellow jacket venom, Adhesive [tape], Brintellix [vortioxetine], Cymbalta [duloxetine hcl], and Penicillins  Family History  Problem Relation Age of Onset  . Cervical cancer Mother   . Lung cancer  Father   . Colon polyps Father        ???  . Pneumonia Brother   . Colon cancer Paternal Grandmother 88  . AAA (abdominal aortic aneurysm) Paternal Grandmother   . Breast cancer Paternal Grandmother   . Colon cancer Paternal Grandfather 32  . Barrett's esophagus Paternal Aunt   . Stomach cancer Neg Hx   . Pancreatic cancer Neg Hx     Social History Social History   Tobacco Use  . Smoking status: Former Smoker    Years: 0.00    Types: Cigarettes    Quit date: 02/14/2009    Years since quitting: 10.9  . Smokeless tobacco: Current User    Types: Snuff  Substance Use Topics  . Alcohol use: No    Alcohol/week: 0.0 standard drinks  . Drug use: No    Review of Systems  Constitutional: No fever/chills Eyes: No visual changes. ENT: No sore throat. Cardiovascular: Denies chest pain. Respiratory: Positive shortness of breath and mild cough.  Gastrointestinal: No abdominal pain.  No nausea, no  vomiting.  No diarrhea.  No constipation. Genitourinary: Negative for dysuria. Musculoskeletal: Negative for back pain. Skin: Negative for rash. Neurological: Negative for headaches, focal weakness or numbness.  10-point ROS otherwise negative.  ____________________________________________   PHYSICAL EXAM:  VITAL SIGNS: ED Triage Vitals [02/07/20 0855]  Enc Vitals Group     BP (!) 136/111     Pulse Rate 90     Resp 19     Temp 98.2 F (36.8 C)     Temp Source Oral     SpO2 93 %     Weight 187 lb 6.3 oz (85 kg)     Height '5\' 5"'$  (1.651 m)   Constitutional: Alert and oriented. Well appearing and in no acute distress. Eyes: Conjunctivae are normal.  Head: Atraumatic. Nose: No congestion/rhinnorhea. Mouth/Throat: Mucous membranes are moist.  Neck: No stridor.  Cardiovascular: Normal rate, regular rhythm. Good peripheral circulation. Grossly normal heart sounds.   Respiratory: Slight increased respiratory effort.  No retractions. Lungs with crackles at the bases bilaterally.  No wheezing.  Gastrointestinal: Soft and nontender. No distention.  Musculoskeletal: No lower extremity tenderness with bilateral 1+ pitting edema. No gross deformities of extremities. Neurologic:  Normal speech and language. No gross focal neurologic deficits are appreciated.  Skin:  Skin is warm, dry and intact. No rash noted.  ____________________________________________   LABS (all labs ordered are listed, but only abnormal results are displayed)  Labs Reviewed  COMPREHENSIVE METABOLIC PANEL - Abnormal; Notable for the following components:      Result Value   BUN 78 (*)    Creatinine, Ser 6.20 (*)    Calcium 7.6 (*)    Albumin 3.3 (*)    GFR calc non Af Amer 7 (*)    GFR calc Af Amer 8 (*)    All other components within normal limits  BRAIN NATRIURETIC PEPTIDE - Abnormal; Notable for the following components:   B Natriuretic Peptide 2,062.0 (*)    All other components within normal limits  CBC WITH DIFFERENTIAL/PLATELET - Abnormal; Notable for the following components:   RBC 3.40 (*)    Hemoglobin 10.1 (*)    HCT 32.6 (*)    All other components within normal limits  MAGNESIUM - Abnormal; Notable for the following components:   Magnesium 2.6 (*)    All other components within normal limits  PHOSPHORUS - Abnormal; Notable for the following components:   Phosphorus 6.8 (*)    All other components within normal limits  SARS CORONAVIRUS 2 BY RT PCR (HOSPITAL ORDER, [facility])  MRSA PCR SCREENING  HIV ANTIBODY (ROUTINE TESTING W REFLEX)  BASIC METABOLIC PANEL  CBC   ____________________________________________  EKG   EKG Interpretation  Date/Time:  Wednesday Feb 07 2020 08:52:57 EDT Ventricular Rate:  90 PR Interval:    QRS Duration: 141 QT Interval:  427 QTC Calculation: 523 R Axis:   114 Text Interpretation: Sinus rhythm Nonspecific intraventricular conduction delay Probable anteroseptal infarct, recent No STEMI Confirmed by Nanda Quinton [phone]) on 02/07/2020 8:56:01 AM       ____________________________________________  RADIOLOGY  DG Chest Port 1 View  Result Date: 02/07/2020 CLINICAL DATA:  Cough and shortness of breath. Missed dialysis fifth ray. EXAM: PORTABLE CHEST 1 VIEW COMPARISON:  Chest x-ray dated February 27, 2019. FINDINGS: New tunneled left internal jugular dialysis catheter with interval removal of the previously seen tunneled right internal jugular dialysis catheter. Stable heart size at the upper limits of normal. Diffuse interstitial  thickening with mild patchy opacities in the right upper lobe and both lung bases. Small bilateral pleural effusions. No pneumothorax. No acute osseous abnormality. IMPRESSION: 1. Mild pulmonary edema and small bilateral pleural effusions. Electronically Signed   By: Titus Dubin M.D.   On: 02/07/2020 09:31    ____________________________________________   PROCEDURES  Procedure(s) performed:   Procedures  CRITICAL CARE Performed by: Margette Fast Total critical care time: 35 minutes Critical care time was exclusive of separately billable procedures and treating other patients. Critical care was necessary to treat or prevent imminent or life-threatening deterioration. Critical care was time spent personally by me on the following activities: development of treatment plan with patient and/or surrogate as well as nursing, discussions with consultants, evaluation of patient's response to treatment, examination of patient, obtaining history from patient or surrogate, ordering and performing treatments and interventions, ordering and review of laboratory studies, ordering and review of radiographic studies, pulse oximetry and re-evaluation of patient's condition.  Nanda Quinton, MD Emergency Medicine  ____________________________________________   INITIAL IMPRESSION / ASSESSMENT AND PLAN / ED COURSE  Pertinent labs & imaging results that were available during my care of  the patient were reviewed by me and considered in my medical decision making (see chart for details).   Patient presents to the emergency department for evaluation of shortness of breath in the setting of missing dialysis yesterday.  The patient is awake and alert.  She is speaking in full sentences but does have some mild increased work of breathing.  She has crackles at the bases bilaterally and x-ray showing some pulmonary edema with small bilateral pleural effusions.  Doubt COVID-19 or other infectious process clinically.  Doubt atypical ACS or PE.  Plan for Covid testing here along with screening labs and will discuss dialysis with nephrology.   Spoke with Nephrology Dr. Joelyn Oms. He will consult for HD today. Patient with O2 requirement here. No worsening respiratory status on re-evaluation. Plan for admit.   Discussed patient's case with [facility] to request admission. Patient and family (if present) updated with plan. Care transferred to [facility] service.  I reviewed all nursing notes, vitals, pertinent old records, EKGs, labs, imaging (as available).  ____________________________________________  FINAL CLINICAL IMPRESSION(S) / ED DIAGNOSES  Final diagnoses:  Acute respiratory failure with hypoxia ([place])  Acute pulmonary edema ([place])     MEDICATIONS GIVEN DURING THIS VISIT:  Medications  Chlorhexidine Gluconate Cloth 2 % PADS 6 each (has no administration in time range)  furosemide (LASIX) tablet 80 mg (80 mg Oral Given 02/07/20 2013)  venlafaxine [facility]) tablet 37.5 mg (has no administration in time range)  sertraline (ZOLOFT) tablet 100 mg (100 mg Oral Given 02/07/20 2013)  hydrALAZINE (APRESOLINE) tablet 50 mg (has no administration in time range)  isosorbide dinitrate (ISORDIL) tablet 10 mg (has no administration in time range)  metoprolol succinate (TOPROL-XL) 24 hr tablet 50 mg (50 mg Oral Given 02/07/20 2013)  calcium acetate (PHOSLO) capsule 1,334 mg (has no administration in time  range)  multivitamin (RENA-VIT) tablet 1 tablet (1 tablet Oral Not Given 02/07/20 2012)  lidocaine-prilocaine (EMLA) cream 1 application (has no administration in time range)  loratadine (CLARITIN) tablet 10 mg (has no administration in time range)  albuterol (PROVENTIL) (2.5 MG/3ML) 0.083% nebulizer solution 2.5 mg (has no administration in time range)  heparin injection 5,000 Units (has no administration in time range)  ondansetron (ZOFRAN) tablet 4 mg (has no administration in time range)    Or  ondansetron (ZOFRAN) injection 4  mg (has no administration in time range)  acetaminophen (TYLENOL) tablet 650 mg (has no administration in time range)    Or  acetaminophen (TYLENOL) suppository 650 mg (has no administration in time range)  ipratropium-albuterol (DUONEB) 0.5-2.5 (3) MG/3ML nebulizer solution 3 mL (has no administration in time range)  heparin lock flush 100 unit/mL (500 Units Intravenous Given by Other 02/07/20 1935)    Note:  This document was prepared using Dragon voice recognition software and may include unintentional dictation errors.  Nanda Quinton, MD, [facility] Emergency Medicine    Journiee Feldkamp, [facility], MD 02/07/20 2109

## 2020-02-07 NOTE — ED Notes (Signed)
Dialysis nurse now at bedside.

## 2020-02-07 NOTE — H&P (Signed)
History and Physical  Summer Cook ZJQ:[id number] DOB: [birth year] DOA: 02/07/2020   PCP: Redmond School, MD   Patient coming from: Home  Chief Complaint: sob  HPI:  Summer Cook is a 54 y.o. female with medical history of ESRD receiving dialysis on Tues-Sat only at [facility] presenting with shortness of breath that began in early morning around 1:30 AM on 02/07/2020.  The patient states that she did not go to dialysis on 02/06/2020 because she did not feel the she had " that much fluid".  In addition, the patient states that she only stayed for dialysis 1.5 hours on 02/03/2020.  She denies any fevers, chills, chest pain, hemoptysis, nausea, vomiting, diarrhea, abdominal pain.  He has had a nonproductive cough.  She quit smoking 2010 after approximately 10-pack-year history.  She denies any worsening lower extremity edema.  Notably, the patient saw her primary care provider on 02/05/2020 because of chest congestion.  She was placed on doxycycline and prednisone. In the emergency department, the patient was afebrile hemodynamically stable with oxygen saturation 85% room air.  She was placed on 1.5 L with improvement to 94-95%.   Assessment/Plan: Acute pulmonary edema/fluid overload -Secondary to missing dialysis -Suspect a degree of poor compliance -Nephrology consulted--> planning for dialysis -Continue furosemide which she continues to take at home  Acute respiratory failure with hypoxia -Secondary to pulmonary edema -Stable on 1.5 L -Chest x-ray shows pulmonary edema with a small bilateral pleural effusions  CAD -no chest pain -continue metoprolol succinate -continue isordil  ESRD -Dialysis per nephrology -Nephrology has been consulted for maintenance dialysis -Continue PhosLo  Essential hypertension -Continue hydralazine and metoprolol succinate  Hyperlipidemia -Continue statin  Depression/anxiety -Continue Zoloft and Effexor  ITP -sp splenectomy -platelets  improved       Past Medical History:  Diagnosis Date  . Allergy   . Anxiety   . Arthritis   . Asthma   . CAD (coronary artery disease), native coronary artery    a. cath 05/20/17 - 100% distal PDA --> medical mangement (no intervention done)  . Candida esophagitis ([place])   . Chronic combined systolic and diastolic CHF (congestive heart failure) ([place])   . Depression   . End stage renal disease on dialysis [facility])    T/ Th/ Sat starting 4/28- [facility]  . Essential hypertension   . GERD (gastroesophageal reflux disease)   . Gout   . Headache   . Hemorrhoids   . History of kidney stones    passed  . 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  Last was Prednisone '40mg'$ ; 2 wk in 10/2012.  She also was on Prednisone bridged with Cellcept briefly but stopped due to lack of insurance.   . Myocardial infarction ([place])   . Perinephric hematoma   . Pneumonia   . Pulmonary embolism ([place]) 04/2012  . Serrated adenoma of colon   . Steroid-induced diabetes ([place])    "when taking Prednisone"  . Stroke ([place])   . Thrush, oral 05/29/2011   Past Surgical History:  Procedure Laterality Date  . AV FISTULA PLACEMENT Right 01/23/2019   Procedure: ARTERIOVENOUS (AV) FISTULA CREATION RIGHT ARM;  Surgeon: Elam Dutch, MD;  Location: [place];  Service: Vascular;  Laterality: Right;  . AV FISTULA PLACEMENT Right 01/30/2019   Procedure: GORETEX GRAFT PLACEMENT  RIGHT ARM;  Surgeon: Elam Dutch, MD;  Location: [place];  Service: Vascular;  Laterality: Right;  . AV FISTULA PLACEMENT Left 08/07/2019   Procedure: LEFT BRACHIOCEPHALIC VENOUS FISTULA;  Surgeon: Elam Dutch, MD;  Location: [place];  Service: Vascular;  Laterality: Left;  . BONE MARROW BIOPSY    . CARDIAC CATHETERIZATION    . CARPAL TUNNEL RELEASE    . CARPAL TUNNEL RELEASE Left 05/20/2016   Procedure: CARPAL TUNNEL RELEASE;   Surgeon: Carole Civil, MD;  Location: [facility];  Service: Orthopedics;  Laterality: Left;  . CARPAL TUNNEL RELEASE Right 07/16/2016   Procedure: RIGHT CARPAL TUNNEL RELEASE;  Surgeon: Carole Civil, MD;  Location: [facility];  Service: Orthopedics;  Laterality: Right;  . CATARACT EXTRACTION    . CHOLECYSTECTOMY  2008  . COLONOSCOPY WITH ESOPHAGOGASTRODUODENOSCOPY (EGD) N/A 01/04/2013   Dr. Gala Romney: esophageal plaques with +KOH, hh. Gastric antrum abnormal, bx reactive gastropathy. Anal canal hemorrhoids, colonic tics, normal TI, single polyp (sessile serrated tubular adenoma). Next TCS 12/2017  . IR FLUORO GUIDE CV LINE RIGHT  01/10/2019  . IR US GUIDE VASC ACCESS RIGHT  01/10/2019  . LEFT HEART CATH AND CORONARY ANGIOGRAPHY N/A 05/20/2017   Procedure: LEFT HEART CATH AND CORONARY ANGIOGRAPHY;  Surgeon: Martinique, Peter M, MD;  Location: [place] CV LAB;  Service: Cardiovascular;  Laterality: N/A;  . 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 TEE WITHOUT CARDIOVERSION N/A 01/03/2016   Procedure: TRANSESOPHAGEAL ECHOCARDIOGRAM (TEE) WITH PROPOFOL;  Surgeon: Satira Sark, MD;  [facility];  Service: Cardiovascular;  Laterality: N/A;   Social History:  reports that she quit smoking about 10 years ago. Her smoking use included cigarettes. She quit after 0.00 years of use. Her smokeless tobacco use includes snuff. She reports that she does not drink alcohol or use drugs.   Family History  Problem Relation Age of Onset  . Cervical cancer Mother   . Lung cancer Father   . Colon polyps Father        ???  . Pneumonia Brother   . Colon cancer Paternal Grandmother 88  . AAA (abdominal aortic aneurysm) Paternal Grandmother   . Breast cancer Paternal Grandmother   . Colon cancer Paternal Grandfather 46  . Barrett's esophagus Paternal Aunt   . Stomach cancer Neg Hx   . Pancreatic cancer Neg Hx      Allergies  Allergen Reactions  . Ciprofloxacin Nausea And Vomiting  . Ceftriaxone Rash  .  Dilaudid [Hydromorphone Hcl] Anxiety and Other (See Comments)    Causes hallucinations, anxiety, and panic per patient  . [place] Venom Swelling    Swelling at injection site  . Adhesive [Tape] Rash    Please use paper tape  . Brintellix [Vortioxetine] Itching  . Cymbalta [Duloxetine Hcl] Itching  . Penicillins Itching    Did it involve swelling of the face/tongue/throat, SOB, or low BP? No Did it involve sudden or severe rash/hives, skin peeling, or any reaction on the inside of your mouth or nose? No Did you need to seek medical attention at a hospital or doctor's office? No When did it last happen?54 yrs old If all above answers are "NO", may proceed with cephalosporin use.     Prior to Admission medications   Medication Sig Start Date End Date Taking? Authorizing Provider  acetaminophen (TYLENOL) 500 MG tablet Take 1,000 mg by mouth every 6 (six) hours as needed for moderate pain.   Yes [provider]  albuterol (PROVENTIL) (2.5 MG/3ML) 0.083% nebulizer solution Take 3 mLs (2.5 mg total) by nebulization every  4 (four) hours as needed for shortness of breath. 01/20/19  Yes Elie Confer, MD  calcium acetate (PHOSLO) 667 MG capsule Take 2 capsules by mouth See admin instructions. Three times daily with meals and snacks 07/04/19  Yes [provider]  doxycycline (ADOXA) 100 MG tablet Take 100 mg by mouth 2 (two) times daily.   Yes [provider]  furosemide (LASIX) 80 MG tablet Take 80 mg by mouth daily. 12/18/19  Yes [provider]  hydrALAZINE (APRESOLINE) 50 MG tablet Take 50 mg by mouth 2 (two) times daily.   Yes [provider]  isosorbide dinitrate (ISORDIL) 10 MG tablet Take 1 tablet (10 mg total) by mouth 2 (two) times daily. 01/26/19 02/07/20 Yes Dunn, Dayna N, PA-C  lidocaine-prilocaine (EMLA) cream Apply 1 application topically as needed (numbing).  02/25/19  Yes [provider]  loratadine (CLARITIN) 10 MG  tablet Take 10 mg by mouth daily as needed for allergies.   Yes [provider]  methylPREDNISolone (MEDROL DOSEPAK) 4 MG TBPK tablet Take 1 tablet by mouth daily. Medrol dosepak dosing. 02/05/20  Yes [provider]  metoprolol succinate (TOPROL-XL) 50 MG 24 hr tablet Take 50 mg by mouth daily. Take with or immediately following a meal.   Yes [provider]  midodrine (PROAMATINE) 5 MG tablet TAKE WHEN BLOOD PRESSURE IS LOW ON DIALYSIS. 09/06/19  Yes [provider]  multivitamin (RENA-VIT) TABS tablet Take 1 tablet by mouth daily. 07/03/19  Yes [provider]  nitroGLYCERIN (NITROSTAT) 0.4 MG SL tablet Place 1 tablet (0.4 mg total) under the tongue every 5 (five) minutes x 3 doses as needed for chest pain. 05/25/17  Yes Carlota Raspberry, Tiffany, PA-C  Oxycodone HCl 10 MG TABS Take 1 tablet (10 mg total) by mouth every 6 (six) hours as needed. 08/07/19  Yes Dagoberto Ligas, PA-C  PROAIR HFA 108 [phone] Base) MCG/ACT inhaler Inhale 2 puffs into the lungs every 4 (four) hours as needed for shortness of breath. 06/28/19  Yes [provider]  sertraline (ZOLOFT) 100 MG tablet Take 100 mg by mouth daily.   Yes [provider]  venlafaxine (EFFEXOR) 37.5 MG tablet Take 37.5 mg by mouth 2 (two) times daily. 12/22/18  Yes [provider]  atorvastatin (LIPITOR) 40 MG tablet Take 1 tablet (40 mg total) by mouth daily at 6 PM. Patient not taking: Reported on 02/07/2020 01/20/19   Elie Confer, MD  diclofenac sodium (VOLTAREN) 1 % GEL Apply 2 g topically 4 (four) times daily. Patient not taking: Reported on 02/07/2020 06/06/17   Debbe Odea, MD    Review of Systems:  Constitutional:  No weight loss, night sweats, Fevers, chills, fatigue.  Head&Eyes: No headache.  No vision loss.  No eye pain or scotoma ENT:  No Difficulty swallowing,Tooth/dental problems,Sore throat,  No ear ache, post nasal drip,  Cardio-vascular:  No chest pain, Orthopnea,  PND, swelling in lower extremities,  dizziness, palpitations  GI:  No  abdominal pain, nausea, vomiting, diarrhea, loss of appetite, hematochezia, melena, heartburn, indigestion, Resp:   No coughing up of blood .No wheezing.No chest wall deformity  Skin:  no rash or lesions.  GU:  no dysuria, change in color of urine, no urgency or frequency. No flank pain.  Musculoskeletal:  No joint pain or swelling. No decreased range of motion. No back pain.  Psych:  No change in mood or affect. No depression or anxiety. Neurologic: No headache, no dysesthesia, no focal weakness, no vision  loss. No syncope  Physical Exam: Vitals:   02/07/20 1000 02/07/20 1030 02/07/20 1100 02/07/20 1130  BP: (!) 144/98 136/85 (!) 130/91 (!) 143/94  Pulse: 89 98 92 95  Resp: (!) 22 (!) 24 (!) 24 (!) 24  Temp:      TempSrc:      SpO2: 95% 94% 96% 95%  Weight:      Height:       General:  A&O x 3, NAD, nontoxic, pleasant/cooperative Head/Eye: No conjunctival hemorrhage, no icterus, [place]/AT, No nystagmus ENT:  No icterus,  No thrush, good dentition, no pharyngeal exudate Neck:  No masses, no lymphadenpathy, no bruits CV:  RRR, no rub, no gallop, no S3 Lung:  Bilateral crackles. No wheeze Abdomen: soft/NT, +BS, nondistended, no peritoneal signs Ext: No cyanosis, No rashes, No petechiae, No lymphangitis, No edema Neuro: CNII-XII intact, strength 4/5 in bilateral upper and lower extremities, no dysmetria  Labs on Admission:  Basic Metabolic Panel: Recent Labs  Lab 02/07/20 0944  NA 137  K 3.8  CL 101  CO2 22  GLUCOSE 91  BUN 78*  CREATININE 6.20*  CALCIUM 7.6*  MG 2.6*  PHOS 6.8*   Liver Function Tests: Recent Labs  Lab 02/07/20 0944  AST 25  ALT 21  ALKPHOS 70  BILITOT 0.6  PROT 6.7  ALBUMIN 3.3*   No results for input(s): LIPASE, AMYLASE in the last 168 hours. No results for input(s): AMMONIA in the last 168 hours. CBC: Recent Labs  Lab 02/07/20 0944  WBC 8.4  NEUTROABS 5.9  HGB  10.1*  HCT 32.6*  MCV 95.9  PLT 218   Coagulation Profile: No results for input(s): INR, PROTIME in the last 168 hours. Cardiac Enzymes: No results for input(s): CKTOTAL, CKMB, CKMBINDEX, TROPONINI in the last 168 hours. BNP: Invalid input(s): POCBNP CBG: No results for input(s): GLUCAP in the last 168 hours. Urine analysis:    Component Value Date/Time   COLORURINE YELLOW (A) 03/12/2019 1847   APPEARANCEUR CLOUDY (A) 03/12/2019 1847   LABSPEC 1.008 03/12/2019 1847   PHURINE 8.0 03/12/2019 1847   GLUCOSEU NEGATIVE 03/12/2019 1847   HGBUR SMALL (A) 03/12/2019 1847   BILIRUBINUR NEGATIVE 03/12/2019 1847   KETONESUR NEGATIVE 03/12/2019 1847   PROTEINUR 100 (A) 03/12/2019 1847   UROBILINOGEN 0.2 02/17/2015 1325   NITRITE NEGATIVE 03/12/2019 1847   LEUKOCYTESUR LARGE (A) 03/12/2019 1847   Sepsis Labs: '@LABRCNTIP'$ (procalcitonin:4,lacticidven:4) ) Recent Results (from the past 240 hour(s))  SARS Coronavirus 2 by RT PCR (hospital order, performed in [place] hospital lab) Nasopharyngeal Nasopharyngeal Swab     Status: None   Collection Time: 02/07/20 10:22 AM   Specimen: Nasopharyngeal Swab  Result Value Ref Range Status   SARS Coronavirus 2 NEGATIVE NEGATIVE Final    Comment: (NOTE) SARS-CoV-2 target nucleic acids are NOT DETECTED. The SARS-CoV-2 RNA is generally detectable in upper and lower respiratory specimens during the acute phase of infection. The lowest concentration of SARS-CoV-2 viral copies this assay can detect is 250 copies / mL. A negative result does not preclude SARS-CoV-2 infection and should not be used as the sole basis for treatment or other patient management decisions.  A negative result may occur with improper specimen collection / handling, submission of specimen other than nasopharyngeal swab, presence of viral mutation(s) within the areas targeted by this assay, and inadequate number of viral copies (<250 copies / mL). A negative result must be  combined with clinical observations, patient history, and epidemiological information. Fact Sheet for  Patients:   StrictlyIdeas.no Fact Sheet for Healthcare Providers: BankingDealers.co.za This test is not yet approved or cleared  by the [place] FDA and has been authorized for detection and/or diagnosis of SARS-CoV-2 by FDA under an Emergency Use Authorization (EUA).  This EUA will remain in effect (meaning this test can be used) for the duration of the COVID-19 declaration under Section 564(b)(1) of the Act, 21 U.S.C. section 360bbb-3(b)(1), unless the authorization is terminated or revoked sooner. Performed at [facility], [address], [place], [place] [address]      Radiological Exams on Admission: DG Chest [facility] 1 View  Result Date: 02/07/2020 CLINICAL DATA:  Cough and shortness of breath. Missed dialysis fifth ray. EXAM: PORTABLE CHEST 1 VIEW COMPARISON:  Chest x-ray dated February 27, 2019. FINDINGS: New tunneled left internal jugular dialysis catheter with interval removal of the previously seen tunneled right internal jugular dialysis catheter. Stable heart size at the upper limits of normal. Diffuse interstitial thickening with mild patchy opacities in the right upper lobe and both lung bases. Small bilateral pleural effusions. No pneumothorax. No acute osseous abnormality. IMPRESSION: 1. Mild pulmonary edema and small bilateral pleural effusions. Electronically Signed   By: Titus Dubin M.D.   On: 02/07/2020 09:31    EKG: Independently reviewed. Sinus IVCD    Time spent:60 minutes Code Status:   FULL Family Communication:  No Family at bedside Disposition Plan: expect 1-2 day hospitalization Consults called: renal DVT Prophylaxis: [place] Heparin     Orson Eva, DO  [facility] Pager [phone]  If 7PM-7AM, please contact night-coverage [email or web address] Password [email or web address] 02/07/2020, 1:18 PM

## 2020-02-08 DIAGNOSIS — N186 End stage renal disease: Secondary | ICD-10-CM | POA: Diagnosis not present

## 2020-02-08 DIAGNOSIS — Z992 Dependence on renal dialysis: Secondary | ICD-10-CM | POA: Diagnosis not present

## 2020-02-08 DIAGNOSIS — I120 Hypertensive chronic kidney disease with stage 5 chronic kidney disease or end stage renal disease: Secondary | ICD-10-CM | POA: Diagnosis not present

## 2020-02-08 DIAGNOSIS — N250 Renal osteodystrophy: Secondary | ICD-10-CM | POA: Diagnosis not present

## 2020-02-08 DIAGNOSIS — D631 Anemia in chronic kidney disease: Secondary | ICD-10-CM | POA: Diagnosis not present

## 2020-02-08 LAB — CBC
HCT: 32.5 % — ABNORMAL LOW (ref 36.0–46.0)
Hemoglobin: 10.3 g/dL — ABNORMAL LOW (ref 12.0–15.0)
MCH: 29.9 pg (ref 26.0–34.0)
MCHC: 31.7 g/dL (ref 30.0–36.0)
MCV: 94.2 fL (ref 80.0–100.0)
Platelets: 198 10*3/uL (ref 150–400)
RBC: 3.45 MIL/uL — ABNORMAL LOW (ref 3.87–5.11)
RDW: 14.9 % (ref 11.5–15.5)
WBC: 5.2 10*3/uL (ref 4.0–10.5)
nRBC: 0 % (ref 0.0–0.2)

## 2020-02-08 LAB — BASIC METABOLIC PANEL
Anion gap: 11 (ref 5–15)
BUN: 42 mg/dL — ABNORMAL HIGH (ref 6–20)
CO2: 27 mmol/L (ref 22–32)
Calcium: 7.8 mg/dL — ABNORMAL LOW (ref 8.9–10.3)
Chloride: 98 mmol/L (ref 98–111)
Creatinine, Ser: 3.99 mg/dL — ABNORMAL HIGH (ref 0.44–1.00)
GFR calc Af Amer: 14 mL/min — ABNORMAL LOW (ref >60)
GFR calc non Af Amer: 12 mL/min — ABNORMAL LOW (ref >60)
Glucose, Bld: 92 mg/dL (ref 70–99)
Potassium: 4.2 mmol/L (ref 3.5–5.1)
Sodium: 136 mmol/L (ref 135–145)

## 2020-02-08 MED ORDER — HYDRALAZINE HCL 50 MG PO TABS: 25.0000 mg | ORAL_TABLET | Freq: Two times a day (BID) | ORAL | 1 refills | Status: DC

## 2020-02-08 NOTE — Discharge Planning (Signed)
Physician Discharge Summary  Summer Cook WYO:[id number] DOB: 1966-06-09 DOA: 02/07/2020  PCP: Redmond School, MD  Admit date: 02/07/2020 Discharge date: 02/08/2020  Time spent: 35 minutes  Recommendations for Outpatient Follow-up:  1. Repeat basic metabolic panel to follow electrolytes and renal function 2. Reassess blood pressure and further adjust antihypertensive regimen as needed 3. Continue helping with patient's hemodialysis compliance.   Discharge Diagnoses:  Active Problems:   Essential hypertension   Idiopathic thrombocytopenic purpura (HCC)   Acute respiratory failure with hypoxia (HCC)   ESRD (end stage renal disease) ([place])   Discharge Condition: Stable and improved.  No requiring oxygen supplementation and feeling back to baseline.  Patient discharged home with instruction to be compliant with her dialysis treatment as an outpatient and to follow-up with PCP in 10 days.  CODE STATUS: Full code  Diet recommendation: Heart healthy diet.  Filed Weights   02/07/20 0855 02/08/20 0106  Weight: 85 kg 85.9 kg    History of present illness:  As per H&P written by Dr. Carles Collet on 02/07/2020 54 y.o. female with medical history of ESRD receiving dialysis on Tues-Sat only at [facility] presenting with shortness of breath that began in early morning around 1:30 AM on 02/07/2020.  The patient states that she did not go to dialysis on 02/06/2020 because she did not feel the she had " that much fluid".  In addition, the patient states that she only stayed for dialysis 1.5 hours on 02/03/2020.  She denies any fevers, chills, chest pain, hemoptysis, nausea, vomiting, diarrhea, abdominal pain.  He has had a nonproductive cough.  She quit smoking 2010 after approximately 10-pack-year history.  She denies any worsening lower extremity edema.  Notably, the patient saw her primary care provider on 02/05/2020 because of chest congestion.  She was placed on doxycycline and prednisone. In the emergency  department, the patient was afebrile hemodynamically stable with oxygen saturation 85% room air.  She was placed on 1.5 L with improvement to 94-95%.   Hospital Course:  1-acute pulmonary edema/fluid overload -Secondary to missing dialysis -Long discussion regarding importance for hemodialysis compliance sustained with patient. -Drastic improvement after dialysis provided inpatient. -Continue use of furosemide as previously prescribed as an outpatient -Importance of low-sodium diet and adequate hydration discussed with patient -At discharge felt back to baseline and no requiring oxygen supplementation.  2-acute respiratory failure with hypoxia in the setting of pulmonary edema -See above for details -No oxygen supplementation required at time of discharge.  3-coronary artery disease -No chest pain -Continue metoprolol and continue isordil as prescribed prior to admission -Heart healthy diet has been encouraged. -Continue follow-up with cardiology service.  4-ESRD/secondary hyperparathyroidism -Continue close follow-up by nephrology service and outpatient dialysis -Continue PhosLo  5-essential hypertension -Continue hydralazine and metoprolol -Blood pressure stable overall. -Patient instructed to follow heart healthy diet.  6-hyperlipidemia -Continue statin.  7-depression/anxiety -Continue Zoloft and Effexor -No suicidal ideation or hallucinations.  8-ITP -Status post splenectomy -Platelets improving within normal limits currently. -No signs of acute bleeding appreciated. -Continue to follow platelets count intermittently.  Procedures: See below for x-ray reports Inpatient hemodialysis treatment  Consultations:  Nephrology service  Discharge Exam: Vitals:   02/08/20 0946 02/08/20 1150  BP: (!) 74/42 103/68  Pulse: 80 78  Resp:  16  Temp:  98.6 F (37 C)  SpO2:  94%    General: Afebrile, no chest pain, no shortness of breath and good oxygen saturation on  room air.  Patient feels back to baseline and would  like to go home. Cardiovascular: S1 and S2, no rubs, no gallops, no JVD. Respiratory: Decreased breath sounds at the bases, no frank crackles, no wheezing, no using accessory muscles.  Normal respiratory effort. Abdomen: Soft, nontender, distended, positive bowel sounds Extremities: No edema, no cyanosis or clubbing.  Discharge Instructions    Allergies as of 02/08/2020      Reactions   Ciprofloxacin Nausea And Vomiting   Ceftriaxone Rash   Dilaudid [hydromorphone Hcl] Anxiety, Other (See Comments)   Causes hallucinations, anxiety, and panic per patient   Yellow Jacket Venom Swelling   Swelling at injection site   Adhesive [tape] Rash   Please use paper tape   Brintellix [vortioxetine] Itching   Cymbalta [duloxetine Hcl] Itching   Penicillins Itching   Did it involve swelling of the face/tongue/throat, SOB, or low BP? No Did it involve sudden or severe rash/hives, skin peeling, or any reaction on the inside of your mouth or nose? No Did you need to seek medical attention at a hospital or doctor's office? No When did it last happen?54 yrs old If all above answers are "NO", may proceed with cephalosporin use.      Medication List    STOP taking these medications   doxycycline 100 MG tablet Commonly known as: ADOXA   methylPREDNISolone 4 MG Tbpk tablet Commonly known as: MEDROL DOSEPAK     TAKE these medications   acetaminophen 500 MG tablet Commonly known as: TYLENOL Take 1,000 mg by mouth every 6 (six) hours as needed for moderate pain.   albuterol (2.5 MG/3ML) 0.083% nebulizer solution Commonly known as: PROVENTIL Take 3 mLs (2.5 mg total) by nebulization every 4 (four) hours as needed for shortness of breath.   ProAir HFA 108 (90 Base) MCG/ACT inhaler Generic drug: albuterol Inhale 2 puffs into the lungs every 4 (four) hours as needed for shortness of breath.   atorvastatin 40 MG tablet Commonly known as:  LIPITOR Take 1 tablet (40 mg total) by mouth daily at 6 PM.   calcium acetate 667 MG capsule Commonly known as: PHOSLO Take 2 capsules by mouth See admin instructions. Three times daily with meals and snacks   diclofenac sodium 1 % Gel Commonly known as: VOLTAREN Apply 2 g topically 4 (four) times daily.   furosemide 80 MG tablet Commonly known as: LASIX Take 80 mg by mouth daily.   hydrALAZINE 50 MG tablet Commonly known as: APRESOLINE Take 0.5 tablets (25 mg total) by mouth 2 (two) times daily. What changed: how much to take   isosorbide dinitrate 10 MG tablet Commonly known as: ISORDIL Take 1 tablet (10 mg total) by mouth 2 (two) times daily.   lidocaine-prilocaine cream Commonly known as: EMLA Apply 1 application topically as needed (numbing).   loratadine 10 MG tablet Commonly known as: CLARITIN Take 10 mg by mouth daily as needed for allergies.   metoprolol succinate 50 MG 24 hr tablet Commonly known as: TOPROL-XL Take 50 mg by mouth daily. Take with or immediately following a meal.   midodrine 5 MG tablet Commonly known as: PROAMATINE TAKE WHEN BLOOD PRESSURE IS LOW ON DIALYSIS.   multivitamin Tabs tablet Take 1 tablet by mouth daily.   nitroGLYCERIN 0.4 MG SL tablet Commonly known as: NITROSTAT Place 1 tablet (0.4 mg total) under the tongue every 5 (five) minutes x 3 doses as needed for chest pain.   Oxycodone HCl 10 MG Tabs Take 1 tablet (10 mg total) by mouth every 6 (six) hours as needed.  sertraline 100 MG tablet Commonly known as: ZOLOFT Take 100 mg by mouth daily.   venlafaxine 37.5 MG tablet Commonly known as: EFFEXOR Take 37.5 mg by mouth 2 (two) times daily.      Allergies  Allergen Reactions  . Ciprofloxacin Nausea And Vomiting  . Ceftriaxone Rash  . Dilaudid [Hydromorphone Hcl] Anxiety and Other (See Comments)    Causes hallucinations, anxiety, and panic per patient  . Yellow Jacket Venom Swelling    Swelling at injection site  .  Adhesive [Tape] Rash    Please use paper tape  . Brintellix [Vortioxetine] Itching  . Cymbalta [Duloxetine Hcl] Itching  . Penicillins Itching    Did it involve swelling of the face/tongue/throat, SOB, or low BP? No Did it involve sudden or severe rash/hives, skin peeling, or any reaction on the inside of your mouth or nose? No Did you need to seek medical attention at a hospital or doctor's office? No When did it last happen?54 yrs old If all above answers are "NO", may proceed with cephalosporin use.      The results of significant diagnostics from this hospitalization (including imaging, microbiology, ancillary and laboratory) are listed below for reference.    Significant Diagnostic Studies: DG Chest Port 1 View  Result Date: 02/07/2020 CLINICAL DATA:  Cough and shortness of breath. Missed dialysis fifth ray. EXAM: PORTABLE CHEST 1 VIEW COMPARISON:  Chest x-ray dated February 27, 2019. FINDINGS: New tunneled left internal jugular dialysis catheter with interval removal of the previously seen tunneled right internal jugular dialysis catheter. Stable heart size at the upper limits of normal. Diffuse interstitial thickening with mild patchy opacities in the right upper lobe and both lung bases. Small bilateral pleural effusions. No pneumothorax. No acute osseous abnormality. IMPRESSION: 1. Mild pulmonary edema and small bilateral pleural effusions. Electronically Signed   By: Titus Dubin M.D.   On: 02/07/2020 09:31    Microbiology: Recent Results (from the past 240 hour(s))  SARS Coronavirus 2 by RT PCR (hospital order, performed in [facility] hospital lab) Nasopharyngeal Nasopharyngeal Swab     Status: None   Collection Time: 02/07/20 10:22 AM   Specimen: Nasopharyngeal Swab  Result Value Ref Range Status   SARS Coronavirus 2 NEGATIVE NEGATIVE Final    Comment: (NOTE) SARS-CoV-2 target nucleic acids are NOT DETECTED. The SARS-CoV-2 RNA is generally detectable in upper and  lower respiratory specimens during the acute phase of infection. The lowest concentration of SARS-CoV-2 viral copies this assay can detect is 250 copies / mL. A negative result does not preclude SARS-CoV-2 infection and should not be used as the sole basis for treatment or other patient management decisions.  A negative result may occur with improper specimen collection / handling, submission of specimen other than nasopharyngeal swab, presence of viral mutation(s) within the areas targeted by this assay, and inadequate number of viral copies (<250 copies / mL). A negative result must be combined with clinical observations, patient history, and epidemiological information. Fact Sheet for Patients:   StrictlyIdeas.no Fact Sheet for Healthcare Providers: BankingDealers.co.za This test is not yet approved or cleared  by the [place] FDA and has been authorized for detection and/or diagnosis of SARS-CoV-2 by FDA under an Emergency Use Authorization (EUA).  This EUA will remain in effect (meaning this test can be used) for the duration of the COVID-19 declaration under Section 564(b)(1) of the Act, 21 U.S.C. section 360bbb-3(b)(1), unless the authorization is terminated or revoked sooner. Performed at [facility], [phone]  [address], [place], [place] [address]   MRSA PCR Screening     Status: None   Collection Time: 02/07/20  1:42 PM   Specimen: Nasal Mucosa; Nasopharyngeal  Result Value Ref Range Status   MRSA by PCR NEGATIVE NEGATIVE Final    Comment:        The GeneXpert MRSA Assay (FDA approved for NASAL specimens only), is one component of a comprehensive MRSA colonization surveillance program. It is not intended to diagnose MRSA infection nor to guide or monitor treatment for MRSA infections. Performed at [facility], [address] [place], [place] [address]      Labs: Basic Metabolic Panel: Recent Labs  Lab  02/07/20 0944 02/08/20 0545  NA 137 136  K 3.8 4.2  CL 101 98  CO2 22 27  GLUCOSE 91 92  BUN 78* 42*  CREATININE 6.20* 3.99*  CALCIUM 7.6* 7.8*  MG 2.6*  --   PHOS 6.8*  --    Liver Function Tests: Recent Labs  Lab 02/07/20 0944  AST 25  ALT 21  ALKPHOS 70  BILITOT 0.6  PROT 6.7  ALBUMIN 3.3*   CBC: Recent Labs  Lab 02/07/20 0944 02/08/20 0545  WBC 8.4 5.2  NEUTROABS 5.9  --   HGB 10.1* 10.3*  HCT 32.6* 32.5*  MCV 95.9 94.2  PLT 218 198    BNP (last 3 results) Recent Labs    02/07/20 0944  BNP 2,062.0*    Signed:  Barton Dubois MD.  [facility] 02/08/2020, 12:17 PM

## 2020-02-08 NOTE — Consult Note (Signed)
Summer Cook Admit Date: 02/07/2020 02/08/2020 Rexene Agent Requesting Physician:  Long MD  Reason for Consult:  ESRD, SOB, Hypoxia HPI:  54F ESRD [facility] T/S only presented to the ER yesterday with dyspnea and found to have hypoxia on room air requiring nasal cannula. She had missed her HD treatment on 5/11. Imaging, portable chest x-ray, revealed findings of small pleural effusions and pulmonary edema.  Labs are notable for potassium of 3.8, BUN 78. To uses a left upper arm AV fistula but has had several recent infiltrations and required placement of an IJ tunneled catheter.  Yesterday she received dialysis, uneventful, with 3 L of ultrafiltration. This morning she feels markedly improved. She is on room air. She has no dyspnea.  She states that she will go at least 2 times a week to dialysis.  PMH Incudes:  History of CVA  History of PE  History of ITP  CAD  dCHF   Balance of 12 systems is negative w/ exceptions as above  PMH  Past Medical History:  Diagnosis Date  . Allergy   . Anxiety   . Arthritis   . Asthma   . CAD (coronary artery disease), native coronary artery    a. cath 05/20/17 - 100% distal PDA --> medical mangement (no intervention done)  . Candida esophagitis ([place])   . Chronic combined systolic and diastolic CHF (congestive heart failure) ([place])   . Depression   . End stage renal disease on dialysis [facility])    T/ Th/ Sat starting 4/28- [facility]  . Essential hypertension   . GERD (gastroesophageal reflux disease)   . Gout   . Headache   . Hemorrhoids   . History of kidney stones    passed  . 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  Last was Prednisone '40mg'$ ; 2 wk in 10/2012.  She also was on Prednisone bridged with Cellcept briefly but stopped due to lack of insurance.   . Myocardial infarction ([place])   . Perinephric  hematoma   . Pneumonia   . Pulmonary embolism ([place]) 04/2012  . Serrated adenoma of colon   . Steroid-induced diabetes ([place])    "when taking Prednisone"  . Stroke ([place])   . Thrush, oral 05/29/2011   PSH  Past Surgical History:  Procedure Laterality Date  . AV FISTULA PLACEMENT Right 01/23/2019   Procedure: ARTERIOVENOUS (AV) FISTULA CREATION RIGHT ARM;  Surgeon: Elam Dutch, MD;  Location: [place];  Service: Vascular;  Laterality: Right;  . AV FISTULA PLACEMENT Right 01/30/2019   Procedure: GORETEX GRAFT PLACEMENT  RIGHT ARM;  Surgeon: Elam Dutch, MD;  Location: [facility] OR;  Service: Vascular;  Laterality: Right;  . AV FISTULA PLACEMENT Left 08/07/2019   Procedure: LEFT BRACHIOCEPHALIC VENOUS FISTULA;  Surgeon: Elam Dutch, MD;  Location: [place];  Service: Vascular;  Laterality: Left;  . BONE MARROW BIOPSY    . CARDIAC CATHETERIZATION    . CARPAL TUNNEL RELEASE    . CARPAL TUNNEL RELEASE Left 05/20/2016   Procedure: CARPAL TUNNEL RELEASE;  Surgeon: Carole Civil, MD;  [facility];  Service: Orthopedics;  Laterality: Left;  . CARPAL TUNNEL RELEASE Right 07/16/2016   Procedure: RIGHT CARPAL TUNNEL RELEASE;  Surgeon: Carole Civil, MD;  Location: [facility];  Service: Orthopedics;  Laterality: Right;  . CATARACT EXTRACTION    . CHOLECYSTECTOMY  2008  . COLONOSCOPY WITH ESOPHAGOGASTRODUODENOSCOPY (EGD) N/A  01/04/2013   Dr. Gala Romney: esophageal plaques with +KOH, hh. Gastric antrum abnormal, bx reactive gastropathy. Anal canal hemorrhoids, colonic tics, normal TI, single polyp (sessile serrated tubular adenoma). Next TCS 12/2017  . IR FLUORO GUIDE CV LINE RIGHT  01/10/2019  . IR US GUIDE VASC ACCESS RIGHT  01/10/2019  . LEFT HEART CATH AND CORONARY ANGIOGRAPHY N/A 05/20/2017   Procedure: LEFT HEART CATH AND CORONARY ANGIOGRAPHY;  Surgeon: Martinique, Peter M, MD;  Location: [place] CV LAB;  Service: Cardiovascular;  Laterality: N/A;  . 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 TEE WITHOUT  CARDIOVERSION N/A 01/03/2016   Procedure: TRANSESOPHAGEAL ECHOCARDIOGRAM (TEE) WITH PROPOFOL;  Surgeon: Satira Sark, MD;  [facility];  Service: Cardiovascular;  Laterality: N/A;   FH  Family History  Problem Relation Age of Onset  . Cervical cancer Mother   . Lung cancer Father   . Colon polyps Father        ???  . Pneumonia Brother   . Colon cancer Paternal Grandmother 88  . AAA (abdominal aortic aneurysm) Paternal Grandmother   . Breast cancer Paternal Grandmother   . Colon cancer Paternal Grandfather 40  . Barrett's esophagus Paternal Aunt   . Stomach cancer Neg Hx   . Pancreatic cancer Neg Hx    SH  reports that she quit smoking about 10 years ago. Her smoking use included cigarettes. She quit after 0.00 years of use. Her smokeless tobacco use includes snuff. She reports that she does not drink alcohol or use drugs. Allergies  Allergies  Allergen Reactions  . Ciprofloxacin Nausea And Vomiting  . Ceftriaxone Rash  . Dilaudid [Hydromorphone Hcl] Anxiety and Other (See Comments)    Causes hallucinations, anxiety, and panic per patient  . Yellow Jacket Venom Swelling    Swelling at injection site  . Adhesive [Tape] Rash    Please use paper tape  . Brintellix [Vortioxetine] Itching  . Cymbalta [Duloxetine Hcl] Itching  . Penicillins Itching    Did it involve swelling of the face/tongue/throat, SOB, or low BP? No Did it involve sudden or severe rash/hives, skin peeling, or any reaction on the inside of your mouth or nose? No Did you need to seek medical attention at a hospital or doctor's office? No When did it last happen?54 yrs old If all above answers are "NO", may proceed with cephalosporin use.   Home medications Prior to Admission medications   Medication Sig Start Date End Date Taking? Authorizing Provider  acetaminophen (TYLENOL) 500 MG tablet Take 1,000 mg by mouth every 6 (six) hours as needed for moderate pain.   Yes [provider]   albuterol (PROVENTIL) (2.5 MG/3ML) 0.083% nebulizer solution Take 3 mLs (2.5 mg total) by nebulization every 4 (four) hours as needed for shortness of breath. 01/20/19  Yes Elie Confer, MD  calcium acetate (PHOSLO) 667 MG capsule Take 2 capsules by mouth See admin instructions. Three times daily with meals and snacks 07/04/19  Yes [provider]  doxycycline (ADOXA) 100 MG tablet Take 100 mg by mouth 2 (two) times daily.   Yes [provider]  furosemide (LASIX) 80 MG tablet Take 80 mg by mouth daily. 12/18/19  Yes [provider]  hydrALAZINE (APRESOLINE) 50 MG tablet Take 50 mg by mouth 2 (two) times daily.   Yes [provider]  isosorbide dinitrate (ISORDIL) 10 MG tablet Take 1 tablet (10 mg total) by mouth 2 (two) times daily. 01/26/19 02/07/20 Yes Dunn, Nedra Hai, PA-C  lidocaine-prilocaine (EMLA) cream Apply 1 application topically as needed (numbing).  02/25/19  Yes [provider]  loratadine (CLARITIN) 10 MG tablet Take 10 mg by mouth daily as needed for allergies.   Yes [provider]  methylPREDNISolone (MEDROL DOSEPAK) 4 MG TBPK tablet Take 1 tablet by mouth daily. Medrol dosepak dosing. 02/05/20  Yes [provider]  metoprolol succinate (TOPROL-XL) 50 MG 24 hr tablet Take 50 mg by mouth daily. Take with or immediately following a meal.   Yes [provider]  midodrine (PROAMATINE) 5 MG tablet TAKE WHEN BLOOD PRESSURE IS LOW ON DIALYSIS. 09/06/19  Yes [provider]  multivitamin (RENA-VIT) TABS tablet Take 1 tablet by mouth daily. 07/03/19  Yes [provider]  nitroGLYCERIN (NITROSTAT) 0.4 MG SL tablet Place 1 tablet (0.4 mg total) under the tongue every 5 (five) minutes x 3 doses as needed for chest pain. 05/25/17  Yes Carlota Raspberry, Tiffany, PA-C  Oxycodone HCl 10 MG TABS Take 1 tablet (10 mg total) by mouth every 6 (six) hours as needed. 08/07/19  Yes Dagoberto Ligas, PA-C  PROAIR HFA 108 [phone] Base)  MCG/ACT inhaler Inhale 2 puffs into the lungs every 4 (four) hours as needed for shortness of breath. 06/28/19  Yes [provider]  sertraline (ZOLOFT) 100 MG tablet Take 100 mg by mouth daily.   Yes [provider]  venlafaxine (EFFEXOR) 37.5 MG tablet Take 37.5 mg by mouth 2 (two) times daily. 12/22/18  Yes [provider]  atorvastatin (LIPITOR) 40 MG tablet Take 1 tablet (40 mg total) by mouth daily at 6 PM. Patient not taking: Reported on 02/07/2020 01/20/19   Elie Confer, MD  diclofenac sodium (VOLTAREN) 1 % GEL Apply 2 g topically 4 (four) times daily. Patient not taking: Reported on 02/07/2020 06/06/17   Debbe Odea, MD    Current Medications Scheduled Meds: . calcium acetate  1,334 mg Oral TID WC  . Chlorhexidine Gluconate Cloth  6 each Topical Q0600  . furosemide  80 mg Oral Daily  . heparin  5,000 Units Subcutaneous Q8H  . hydrALAZINE  50 mg Oral BID  . ipratropium-albuterol  3 mL Nebulization TID  . isosorbide dinitrate  10 mg Oral BID  . metoprolol succinate  50 mg Oral Daily  . multivitamin  1 tablet Oral Daily  . sertraline  100 mg Oral Daily  . venlafaxine  37.5 mg Oral BID   Continuous Infusions: PRN Meds:.acetaminophen **OR** acetaminophen, albuterol, calcium acetate, lidocaine-prilocaine, loratadine, ondansetron **OR** ondansetron (ZOFRAN) IV  CBC Recent Labs  Lab 02/07/20 0944 02/08/20 0545  WBC 8.4 5.2  NEUTROABS 5.9  --   HGB 10.1* 10.3*  HCT 32.6* 32.5*  MCV 95.9 94.2  PLT 218 546   Basic Metabolic Panel Recent Labs  Lab 02/07/20 0944 02/08/20 0545  NA 137 136  K 3.8 4.2  CL 101 98  CO2 22 27  GLUCOSE 91 92  BUN 78* 42*  CREATININE 6.20* 3.99*  CALCIUM 7.6* 7.8*  PHOS 6.8*  --     Physical Exam  Blood pressure 105/68, pulse 78, temperature 98.9 F (37.2 C), temperature source Oral, resp. rate 16, height '5\' 5"'$  (1.651 m), weight 85.9 kg, last menstrual period 08/13/2011, SpO2 96 %. GEN: NAD, in bed, breathing  comfortably ENT: NCAT EYES: EOMI CV: Regular, normal S1 and S2 PULM: Clear bilaterally ABD: Soft, nontender SKIN: No rashes or lesions; IJ TDC present EXT: No significant peripheral edema VASCULAR: Left arm AV fistula tortuous, collaterals  present, bruised.  Assessment 42F ESRD [facility] in [place], on Tuesdays and Saturdays presents with hypoxia and dyspnea after missed HD on 5/11.  1. ESRD T/S [facility] 2. Hypoxic respiratory failure, pulmonary edema; resolved with ultrafiltration 3. Recent infiltrations of left arm AV fistula, resting, DC present; need to follow-up with vascular surgery 4. Anemia, hemoglobin greater than 10 5. CKD-BMD, hyperphosphatemia, for outpatient management  Plan 1. Patient is acute issues are resolved. I think she is okay for discharge. 2. Strongly encourage patient to attend every dialysis treatment 3. I also suggested that she would likely benefit from receiving standard 3 times weekly hemodialysis 4. She will need to follow-up as an outpatient with vascular surgery, this will be arranged by her outpatient kidney center+   Rexene Agent  [phone] pgr 02/08/2020, 9:20 AM

## 2020-02-08 NOTE — Progress Notes (Addendum)
Hypotension this morning.  Held BP meds and lasix and contacted Dr. Dyann Kief and charge nurse.  No new orders.  Later BP 103/68.

## 2020-02-08 NOTE — Progress Notes (Signed)
IV removed and discharge instructions reviewed.  To call family for ride

## 2020-02-09 NOTE — Discharge Summary (Signed)
Please refer to discharge planning dictation for details about patient's discharge summary for encounter 02/07/2020 to 02/08/2020.  Summer Cook [phone]

## 2020-02-10 DIAGNOSIS — Z992 Dependence on renal dialysis: Secondary | ICD-10-CM | POA: Diagnosis not present

## 2020-02-10 DIAGNOSIS — N186 End stage renal disease: Secondary | ICD-10-CM | POA: Diagnosis not present

## 2020-02-13 DIAGNOSIS — Z992 Dependence on renal dialysis: Secondary | ICD-10-CM | POA: Diagnosis not present

## 2020-02-13 DIAGNOSIS — N186 End stage renal disease: Secondary | ICD-10-CM | POA: Diagnosis not present

## 2020-02-17 DIAGNOSIS — N186 End stage renal disease: Secondary | ICD-10-CM | POA: Diagnosis not present

## 2020-02-17 DIAGNOSIS — Z992 Dependence on renal dialysis: Secondary | ICD-10-CM | POA: Diagnosis not present

## 2020-02-19 ENCOUNTER — Other Ambulatory Visit (HOSPITAL_COMMUNITY): Payer: Self-pay | Admitting: Internal Medicine

## 2020-02-19 ENCOUNTER — Other Ambulatory Visit: Payer: Self-pay | Admitting: Internal Medicine

## 2020-02-19 DIAGNOSIS — G894 Chronic pain syndrome: Secondary | ICD-10-CM | POA: Diagnosis not present

## 2020-02-19 DIAGNOSIS — J811 Chronic pulmonary edema: Secondary | ICD-10-CM | POA: Diagnosis not present

## 2020-02-19 DIAGNOSIS — E039 Hypothyroidism, unspecified: Secondary | ICD-10-CM | POA: Diagnosis not present

## 2020-02-19 DIAGNOSIS — I10 Essential (primary) hypertension: Secondary | ICD-10-CM | POA: Diagnosis not present

## 2020-02-19 DIAGNOSIS — Z0001 Encounter for general adult medical examination with abnormal findings: Secondary | ICD-10-CM | POA: Diagnosis not present

## 2020-02-19 DIAGNOSIS — R1310 Dysphagia, unspecified: Secondary | ICD-10-CM

## 2020-02-20 DIAGNOSIS — N2581 Secondary hyperparathyroidism of renal origin: Secondary | ICD-10-CM | POA: Diagnosis not present

## 2020-02-20 DIAGNOSIS — N186 End stage renal disease: Secondary | ICD-10-CM | POA: Diagnosis not present

## 2020-02-20 DIAGNOSIS — Z992 Dependence on renal dialysis: Secondary | ICD-10-CM | POA: Diagnosis not present

## 2020-02-23 ENCOUNTER — Encounter (HOSPITAL_COMMUNITY): Payer: Self-pay

## 2020-02-23 ENCOUNTER — Inpatient Hospital Stay (HOSPITAL_COMMUNITY): Admission: RE | Admit: 2020-02-23 | Payer: Medicare Other | Source: Ambulatory Visit

## 2020-02-23 ENCOUNTER — Ambulatory Visit (HOSPITAL_COMMUNITY): Payer: Medicare Other

## 2020-02-24 DIAGNOSIS — Z992 Dependence on renal dialysis: Secondary | ICD-10-CM | POA: Diagnosis not present

## 2020-02-24 DIAGNOSIS — N186 End stage renal disease: Secondary | ICD-10-CM | POA: Diagnosis not present

## 2020-02-26 DIAGNOSIS — Z992 Dependence on renal dialysis: Secondary | ICD-10-CM | POA: Diagnosis not present

## 2020-02-26 DIAGNOSIS — N186 End stage renal disease: Secondary | ICD-10-CM | POA: Diagnosis not present

## 2020-02-27 DIAGNOSIS — Z992 Dependence on renal dialysis: Secondary | ICD-10-CM | POA: Diagnosis not present

## 2020-02-27 DIAGNOSIS — N186 End stage renal disease: Secondary | ICD-10-CM | POA: Diagnosis not present

## 2020-03-01 DIAGNOSIS — I10 Essential (primary) hypertension: Secondary | ICD-10-CM | POA: Diagnosis not present

## 2020-03-01 DIAGNOSIS — E1129 Type 2 diabetes mellitus with other diabetic kidney complication: Secondary | ICD-10-CM | POA: Diagnosis not present

## 2020-03-01 DIAGNOSIS — G894 Chronic pain syndrome: Secondary | ICD-10-CM | POA: Diagnosis not present

## 2020-03-02 DIAGNOSIS — Z992 Dependence on renal dialysis: Secondary | ICD-10-CM | POA: Diagnosis not present

## 2020-03-02 DIAGNOSIS — N186 End stage renal disease: Secondary | ICD-10-CM | POA: Diagnosis not present

## 2020-03-08 DIAGNOSIS — I509 Heart failure, unspecified: Secondary | ICD-10-CM | POA: Diagnosis not present

## 2020-03-08 DIAGNOSIS — J181 Lobar pneumonia, unspecified organism: Secondary | ICD-10-CM | POA: Diagnosis not present

## 2020-03-08 DIAGNOSIS — R410 Disorientation, unspecified: Secondary | ICD-10-CM | POA: Diagnosis not present

## 2020-03-08 DIAGNOSIS — Z992 Dependence on renal dialysis: Secondary | ICD-10-CM | POA: Diagnosis not present

## 2020-03-08 DIAGNOSIS — N186 End stage renal disease: Secondary | ICD-10-CM | POA: Diagnosis not present

## 2020-03-08 DIAGNOSIS — Z8673 Personal history of transient ischemic attack (TIA), and cerebral infarction without residual deficits: Secondary | ICD-10-CM | POA: Diagnosis not present

## 2020-03-08 DIAGNOSIS — R531 Weakness: Secondary | ICD-10-CM | POA: Diagnosis not present

## 2020-03-08 DIAGNOSIS — J90 Pleural effusion, not elsewhere classified: Secondary | ICD-10-CM | POA: Diagnosis not present

## 2020-03-08 DIAGNOSIS — Z9049 Acquired absence of other specified parts of digestive tract: Secondary | ICD-10-CM | POA: Diagnosis not present

## 2020-03-08 DIAGNOSIS — R000 Tachycardia, unspecified: Secondary | ICD-10-CM | POA: Diagnosis not present

## 2020-03-08 DIAGNOSIS — E875 Hyperkalemia: Secondary | ICD-10-CM | POA: Diagnosis not present

## 2020-03-08 DIAGNOSIS — Z20822 Contact with and (suspected) exposure to covid-19: Secondary | ICD-10-CM | POA: Diagnosis not present

## 2020-03-08 DIAGNOSIS — Z87891 Personal history of nicotine dependence: Secondary | ICD-10-CM | POA: Diagnosis not present

## 2020-03-09 DIAGNOSIS — N186 End stage renal disease: Secondary | ICD-10-CM | POA: Diagnosis not present

## 2020-03-09 DIAGNOSIS — Z992 Dependence on renal dialysis: Secondary | ICD-10-CM | POA: Diagnosis not present

## 2020-03-16 DIAGNOSIS — Z992 Dependence on renal dialysis: Secondary | ICD-10-CM | POA: Diagnosis not present

## 2020-03-16 DIAGNOSIS — N186 End stage renal disease: Secondary | ICD-10-CM | POA: Diagnosis not present

## 2020-03-19 DIAGNOSIS — Z992 Dependence on renal dialysis: Secondary | ICD-10-CM | POA: Diagnosis not present

## 2020-03-19 DIAGNOSIS — N2581 Secondary hyperparathyroidism of renal origin: Secondary | ICD-10-CM | POA: Diagnosis not present

## 2020-03-19 DIAGNOSIS — N186 End stage renal disease: Secondary | ICD-10-CM | POA: Diagnosis not present

## 2020-03-23 ENCOUNTER — Inpatient Hospital Stay (HOSPITAL_COMMUNITY)
Admission: AD | Admit: 2020-03-23 | Discharge: 2020-03-25 | DRG: 291 | Disposition: A | Discharge status: Home / Self Care | Payer: Medicare Other | Attending: Family Medicine | Admitting: Family Medicine

## 2020-03-23 ENCOUNTER — Emergency Department (HOSPITAL_COMMUNITY): Payer: Medicare Other

## 2020-03-23 ENCOUNTER — Other Ambulatory Visit: Payer: Self-pay

## 2020-03-23 ENCOUNTER — Encounter (HOSPITAL_COMMUNITY): Payer: Self-pay | Admitting: *Deleted

## 2020-03-23 DIAGNOSIS — J189 Pneumonia, unspecified organism: Secondary | ICD-10-CM | POA: Diagnosis present

## 2020-03-23 DIAGNOSIS — I120 Hypertensive chronic kidney disease with stage 5 chronic kidney disease or end stage renal disease: Secondary | ICD-10-CM | POA: Diagnosis not present

## 2020-03-23 DIAGNOSIS — I10 Essential (primary) hypertension: Secondary | ICD-10-CM | POA: Diagnosis present

## 2020-03-23 DIAGNOSIS — Z9049 Acquired absence of other specified parts of digestive tract: Secondary | ICD-10-CM

## 2020-03-23 DIAGNOSIS — Z720 Tobacco use: Secondary | ICD-10-CM | POA: Diagnosis present

## 2020-03-23 DIAGNOSIS — Z992 Dependence on renal dialysis: Secondary | ICD-10-CM

## 2020-03-23 DIAGNOSIS — R059 Cough, unspecified: Secondary | ICD-10-CM

## 2020-03-23 DIAGNOSIS — I951 Orthostatic hypotension: Secondary | ICD-10-CM | POA: Diagnosis present

## 2020-03-23 DIAGNOSIS — I200 Unstable angina: Secondary | ICD-10-CM

## 2020-03-23 DIAGNOSIS — I252 Old myocardial infarction: Secondary | ICD-10-CM | POA: Diagnosis not present

## 2020-03-23 DIAGNOSIS — R0602 Shortness of breath: Secondary | ICD-10-CM | POA: Diagnosis present

## 2020-03-23 DIAGNOSIS — Z880 Allergy status to penicillin: Secondary | ICD-10-CM

## 2020-03-23 DIAGNOSIS — D631 Anemia in chronic kidney disease: Secondary | ICD-10-CM | POA: Diagnosis present

## 2020-03-23 DIAGNOSIS — Z8673 Personal history of transient ischemic attack (TIA), and cerebral infarction without residual deficits: Secondary | ICD-10-CM | POA: Diagnosis not present

## 2020-03-23 DIAGNOSIS — N186 End stage renal disease: Secondary | ICD-10-CM | POA: Diagnosis present

## 2020-03-23 DIAGNOSIS — E1122 Type 2 diabetes mellitus with diabetic chronic kidney disease: Secondary | ICD-10-CM | POA: Diagnosis present

## 2020-03-23 DIAGNOSIS — I5023 Acute on chronic systolic (congestive) heart failure: Secondary | ICD-10-CM | POA: Diagnosis present

## 2020-03-23 DIAGNOSIS — I5043 Acute on chronic combined systolic (congestive) and diastolic (congestive) heart failure: Secondary | ICD-10-CM | POA: Diagnosis present

## 2020-03-23 DIAGNOSIS — J440 Chronic obstructive pulmonary disease with acute lower respiratory infection: Secondary | ICD-10-CM | POA: Diagnosis not present

## 2020-03-23 DIAGNOSIS — J302 Other seasonal allergic rhinitis: Secondary | ICD-10-CM | POA: Diagnosis present

## 2020-03-23 DIAGNOSIS — Z888 Allergy status to other drugs, medicaments and biological substances status: Secondary | ICD-10-CM

## 2020-03-23 DIAGNOSIS — Z86711 Personal history of pulmonary embolism: Secondary | ICD-10-CM | POA: Diagnosis not present

## 2020-03-23 DIAGNOSIS — E669 Obesity, unspecified: Secondary | ICD-10-CM | POA: Diagnosis present

## 2020-03-23 DIAGNOSIS — M109 Gout, unspecified: Secondary | ICD-10-CM | POA: Diagnosis present

## 2020-03-23 DIAGNOSIS — Z716 Tobacco abuse counseling: Secondary | ICD-10-CM

## 2020-03-23 DIAGNOSIS — K219 Gastro-esophageal reflux disease without esophagitis: Secondary | ICD-10-CM | POA: Diagnosis present

## 2020-03-23 DIAGNOSIS — Z79899 Other long term (current) drug therapy: Secondary | ICD-10-CM

## 2020-03-23 DIAGNOSIS — D849 Immunodeficiency, unspecified: Secondary | ICD-10-CM | POA: Diagnosis present

## 2020-03-23 DIAGNOSIS — I509 Heart failure, unspecified: Secondary | ICD-10-CM

## 2020-03-23 DIAGNOSIS — J9601 Acute respiratory failure with hypoxia: Secondary | ICD-10-CM | POA: Diagnosis present

## 2020-03-23 DIAGNOSIS — Z9114 Patient's other noncompliance with medication regimen: Secondary | ICD-10-CM

## 2020-03-23 DIAGNOSIS — J811 Chronic pulmonary edema: Secondary | ICD-10-CM | POA: Diagnosis not present

## 2020-03-23 DIAGNOSIS — Z20822 Contact with and (suspected) exposure to covid-19: Secondary | ICD-10-CM | POA: Diagnosis not present

## 2020-03-23 DIAGNOSIS — J810 Acute pulmonary edema: Secondary | ICD-10-CM | POA: Diagnosis present

## 2020-03-23 DIAGNOSIS — E8770 Fluid overload, unspecified: Secondary | ICD-10-CM | POA: Diagnosis not present

## 2020-03-23 DIAGNOSIS — E782 Mixed hyperlipidemia: Secondary | ICD-10-CM | POA: Diagnosis present

## 2020-03-23 DIAGNOSIS — I2510 Atherosclerotic heart disease of native coronary artery without angina pectoris: Secondary | ICD-10-CM | POA: Diagnosis not present

## 2020-03-23 DIAGNOSIS — J45909 Unspecified asthma, uncomplicated: Secondary | ICD-10-CM | POA: Diagnosis present

## 2020-03-23 DIAGNOSIS — Z9081 Acquired absence of spleen: Secondary | ICD-10-CM | POA: Diagnosis not present

## 2020-03-23 DIAGNOSIS — R0902 Hypoxemia: Secondary | ICD-10-CM | POA: Diagnosis not present

## 2020-03-23 DIAGNOSIS — Z91030 Bee allergy status: Secondary | ICD-10-CM

## 2020-03-23 DIAGNOSIS — R0601 Orthopnea: Secondary | ICD-10-CM | POA: Diagnosis present

## 2020-03-23 DIAGNOSIS — F411 Generalized anxiety disorder: Secondary | ICD-10-CM | POA: Diagnosis present

## 2020-03-23 DIAGNOSIS — E8889 Other specified metabolic disorders: Secondary | ICD-10-CM | POA: Diagnosis present

## 2020-03-23 DIAGNOSIS — I132 Hypertensive heart and chronic kidney disease with heart failure and with stage 5 chronic kidney disease, or end stage renal disease: Principal | ICD-10-CM | POA: Diagnosis present

## 2020-03-23 DIAGNOSIS — J90 Pleural effusion, not elsewhere classified: Secondary | ICD-10-CM | POA: Diagnosis not present

## 2020-03-23 DIAGNOSIS — Z881 Allergy status to other antibiotic agents status: Secondary | ICD-10-CM

## 2020-03-23 DIAGNOSIS — Z91048 Other nonmedicinal substance allergy status: Secondary | ICD-10-CM

## 2020-03-23 DIAGNOSIS — E119 Type 2 diabetes mellitus without complications: Secondary | ICD-10-CM

## 2020-03-23 DIAGNOSIS — Z6831 Body mass index (BMI) 31.0-31.9, adult: Secondary | ICD-10-CM

## 2020-03-23 DIAGNOSIS — Z885 Allergy status to narcotic agent status: Secondary | ICD-10-CM

## 2020-03-23 LAB — GLUCOSE, CAPILLARY: Glucose-Capillary: 99 mg/dL (ref 70–99)

## 2020-03-23 LAB — CBC WITH DIFFERENTIAL/PLATELET
Abs Immature Granulocytes: 0.02 10*3/uL (ref 0.00–0.07)
Basophils Absolute: 0 10*3/uL (ref 0.0–0.1)
Basophils Relative: 0 %
Eosinophils Absolute: 0 10*3/uL (ref 0.0–0.5)
Eosinophils Relative: 0 %
HCT: 35.8 % — ABNORMAL LOW (ref 36.0–46.0)
Hemoglobin: 11.2 g/dL — ABNORMAL LOW (ref 12.0–15.0)
Immature Granulocytes: 0 %
Lymphocytes Relative: 6 %
Lymphs Abs: 0.4 10*3/uL — ABNORMAL LOW (ref 0.7–4.0)
MCH: 28.9 pg (ref 26.0–34.0)
MCHC: 31.3 g/dL (ref 30.0–36.0)
MCV: 92.3 fL (ref 80.0–100.0)
Monocytes Absolute: 0.2 10*3/uL (ref 0.1–1.0)
Monocytes Relative: 2 %
Neutro Abs: 6.3 10*3/uL (ref 1.7–7.7)
Neutrophils Relative %: 92 %
Platelets: 174 10*3/uL (ref 150–400)
RBC: 3.88 MIL/uL (ref 3.87–5.11)
RDW: 15.6 % — ABNORMAL HIGH (ref 11.5–15.5)
WBC: 6.9 10*3/uL (ref 4.0–10.5)
nRBC: 0 % (ref 0.0–0.2)

## 2020-03-23 LAB — BASIC METABOLIC PANEL
Anion gap: 16 — ABNORMAL HIGH (ref 5–15)
BUN: 62 mg/dL — ABNORMAL HIGH (ref 6–20)
CO2: 19 mmol/L — ABNORMAL LOW (ref 22–32)
Calcium: 8.6 mg/dL — ABNORMAL LOW (ref 8.9–10.3)
Chloride: 99 mmol/L (ref 98–111)
Creatinine, Ser: 5.49 mg/dL — ABNORMAL HIGH (ref 0.44–1.00)
GFR calc Af Amer: 9 mL/min — ABNORMAL LOW (ref >60)
GFR calc non Af Amer: 8 mL/min — ABNORMAL LOW (ref >60)
Glucose, Bld: 123 mg/dL — ABNORMAL HIGH (ref 70–99)
Potassium: 3.7 mmol/L (ref 3.5–5.1)
Sodium: 134 mmol/L — ABNORMAL LOW (ref 135–145)

## 2020-03-23 LAB — CBC
HCT: 35.5 % — ABNORMAL LOW (ref 36.0–46.0)
Hemoglobin: 11.3 g/dL — ABNORMAL LOW (ref 12.0–15.0)
MCH: 29.5 pg (ref 26.0–34.0)
MCHC: 31.8 g/dL (ref 30.0–36.0)
MCV: 92.7 fL (ref 80.0–100.0)
Platelets: 162 10*3/uL (ref 150–400)
RBC: 3.83 MIL/uL — ABNORMAL LOW (ref 3.87–5.11)
RDW: 15.7 % — ABNORMAL HIGH (ref 11.5–15.5)
WBC: 6.4 10*3/uL (ref 4.0–10.5)
nRBC: 0 % (ref 0.0–0.2)

## 2020-03-23 LAB — HEMOGLOBIN A1C
Hgb A1c MFr Bld: 5.5 % (ref 4.8–5.6)
Mean Plasma Glucose: 111.15 mg/dL

## 2020-03-23 LAB — BRAIN NATRIURETIC PEPTIDE: B Natriuretic Peptide: 2843 pg/mL — ABNORMAL HIGH (ref 0.0–100.0)

## 2020-03-23 LAB — SARS CORONAVIRUS 2 BY RT PCR (HOSPITAL ORDER, PERFORMED IN ~~LOC~~ HOSPITAL LAB): SARS Coronavirus 2: NEGATIVE

## 2020-03-23 MED ORDER — SODIUM CHLORIDE 0.9% FLUSH
3.0000 mL | Freq: Two times a day (BID) | INTRAVENOUS | Status: DC
  Administered 2020-03-23 – 2020-03-24 (×3): 3 mL via INTRAVENOUS

## 2020-03-23 MED ORDER — INSULIN ASPART 100 UNIT/ML ~~LOC~~ SOLN: 0.0000 [IU] | Freq: Three times a day (TID) | SUBCUTANEOUS | Status: DC

## 2020-03-23 MED ORDER — FUROSEMIDE 10 MG/ML IJ SOLN
80.0000 mg | Freq: Once | INTRAMUSCULAR | Status: AC
  Administered 2020-03-23: 80 mg via INTRAVENOUS
  Filled 2020-03-23: qty 8

## 2020-03-23 MED ORDER — IPRATROPIUM-ALBUTEROL 0.5-2.5 (3) MG/3ML IN SOLN
3.0000 mL | Freq: Three times a day (TID) | RESPIRATORY_TRACT | Status: DC
  Administered 2020-03-23 – 2020-03-24 (×3): 3 mL via RESPIRATORY_TRACT
  Filled 2020-03-23 (×4): qty 3

## 2020-03-23 MED ORDER — LORATADINE 10 MG PO TABS: 10.0000 mg | ORAL_TABLET | Freq: Every day | ORAL | Status: DC | PRN

## 2020-03-23 MED ORDER — LIDOCAINE HCL (PF) 1 % IJ SOLN: 5.0000 mL | INTRAMUSCULAR | Status: DC | PRN

## 2020-03-23 MED ORDER — HEPARIN SODIUM (PORCINE) 5000 UNIT/ML IJ SOLN
5000.0000 [IU] | Freq: Three times a day (TID) | INTRAMUSCULAR | Status: DC
  Administered 2020-03-23 – 2020-03-25 (×4): 5000 [IU] via SUBCUTANEOUS
  Filled 2020-03-23 (×4): qty 1

## 2020-03-23 MED ORDER — PENTAFLUOROPROP-TETRAFLUOROETH EX AERO: 1.0000 "application " | INHALATION_SPRAY | CUTANEOUS | Status: DC | PRN

## 2020-03-23 MED ORDER — SODIUM CHLORIDE 0.9 % IV SOLN: 100.0000 mL | INTRAVENOUS | Status: DC | PRN

## 2020-03-23 MED ORDER — SERTRALINE HCL 50 MG PO TABS
100.0000 mg | ORAL_TABLET | Freq: Every day | ORAL | Status: DC
  Administered 2020-03-23 – 2020-03-25 (×3): 100 mg via ORAL
  Filled 2020-03-23 (×3): qty 2

## 2020-03-23 MED ORDER — ONDANSETRON HCL 4 MG/2ML IJ SOLN
4.0000 mg | Freq: Once | INTRAMUSCULAR | Status: AC
  Administered 2020-03-23: 4 mg via INTRAVENOUS
  Filled 2020-03-23: qty 2

## 2020-03-23 MED ORDER — ONDANSETRON HCL 4 MG/2ML IJ SOLN: 4.0000 mg | Freq: Four times a day (QID) | INTRAMUSCULAR | Status: DC | PRN

## 2020-03-23 MED ORDER — OXYCODONE HCL 5 MG PO TABS
5.0000 mg | ORAL_TABLET | Freq: Four times a day (QID) | ORAL | Status: DC | PRN
  Administered 2020-03-23 – 2020-03-24 (×2): 5 mg via ORAL
  Filled 2020-03-23 (×3): qty 1

## 2020-03-23 MED ORDER — HYDRALAZINE HCL 25 MG PO TABS: 50.0000 mg | ORAL_TABLET | Freq: Two times a day (BID) | ORAL | Status: DC

## 2020-03-23 MED ORDER — FENTANYL CITRATE (PF) 100 MCG/2ML IJ SOLN
50.0000 ug | Freq: Once | INTRAMUSCULAR | Status: AC
  Administered 2020-03-23: 50 ug via INTRAVENOUS
  Filled 2020-03-23: qty 2

## 2020-03-23 MED ORDER — ACETAMINOPHEN 325 MG PO TABS: 650.0000 mg | ORAL_TABLET | ORAL | Status: DC | PRN

## 2020-03-23 MED ORDER — METOPROLOL SUCCINATE ER 50 MG PO TB24
50.0000 mg | ORAL_TABLET | Freq: Every day | ORAL | Status: DC
  Administered 2020-03-25: 50 mg via ORAL
  Filled 2020-03-23: qty 1

## 2020-03-23 MED ORDER — NITROGLYCERIN 0.4 MG SL SUBL: 0.4000 mg | SUBLINGUAL_TABLET | SUBLINGUAL | Status: DC | PRN

## 2020-03-23 MED ORDER — SODIUM CHLORIDE 0.9 % IV SOLN
250.0000 mL | INTRAVENOUS | Status: DC | PRN
  Administered 2020-03-25: 250 mL via INTRAVENOUS

## 2020-03-23 MED ORDER — ISOSORBIDE DINITRATE 20 MG PO TABS
10.0000 mg | ORAL_TABLET | Freq: Two times a day (BID) | ORAL | Status: DC
  Administered 2020-03-24 – 2020-03-25 (×2): 10 mg via ORAL
  Filled 2020-03-23 (×2): qty 1

## 2020-03-23 MED ORDER — SODIUM CHLORIDE 0.9% FLUSH
3.0000 mL | INTRAVENOUS | Status: DC | PRN
  Administered 2020-03-23 (×2): 3 mL via INTRAVENOUS

## 2020-03-23 MED ORDER — METOPROLOL TARTRATE 5 MG/5ML IV SOLN
5.0000 mg | Freq: Four times a day (QID) | INTRAVENOUS | Status: AC
  Administered 2020-03-23 – 2020-03-24 (×3): 5 mg via INTRAVENOUS
  Filled 2020-03-23 (×3): qty 5

## 2020-03-23 MED ORDER — CHLORHEXIDINE GLUCONATE CLOTH 2 % EX PADS
6.0000 | MEDICATED_PAD | Freq: Every day | CUTANEOUS | Status: DC
  Administered 2020-03-24: 6 via TOPICAL

## 2020-03-23 MED ORDER — LIDOCAINE-PRILOCAINE 2.5-2.5 % EX CREA: 1.0000 "application " | TOPICAL_CREAM | CUTANEOUS | Status: DC | PRN

## 2020-03-23 MED ORDER — FENTANYL CITRATE (PF) 100 MCG/2ML IJ SOLN
25.0000 ug | INTRAMUSCULAR | Status: DC | PRN
  Administered 2020-03-23: 25 ug via INTRAVENOUS
  Filled 2020-03-23: qty 2

## 2020-03-23 MED ORDER — TRAZODONE HCL 50 MG PO TABS: 25.0000 mg | ORAL_TABLET | Freq: Every evening | ORAL | Status: DC | PRN

## 2020-03-23 MED ORDER — ALTEPLASE 2 MG IJ SOLR
2.0000 mg | Freq: Once | INTRAMUSCULAR | Status: DC | PRN
  Filled 2020-03-23: qty 2

## 2020-03-23 MED ORDER — MIDODRINE HCL 5 MG PO TABS: 5.0000 mg | ORAL_TABLET | Freq: Every day | ORAL | Status: DC | PRN

## 2020-03-23 MED ORDER — HEPARIN SODIUM (PORCINE) 1000 UNIT/ML DIALYSIS: 1000.0000 [IU] | INTRAMUSCULAR | Status: DC | PRN

## 2020-03-23 NOTE — TOC Initial Note (Signed)
Transition of Care [facility]) - Initial/Assessment Note   Patient Details  Name: Summer Cook MRN: [id number] Date of Birth: [birth year]  Transition of Care [facility]) CM/SW Contact:    Sherie Don, LCSW Phone Number: 03/23/2020, 3:45 PM  Clinical Narrative: Patient is a 54 year old female who is under observation for acute exacerbation of CHF. TOC received CHF consult. CSW met with patient in ED to complete consult. Per patient, she lives in an apartment with her daughter and aunt. Patient does not currently have any DME in the home and she does not have a history of receiving [place] services. Patient reports she is independent with all ADLs and takes her medication ask prescribed. Patient receives dialysis from [facility] 2 days/week.  CSW completed CHF screening. Patient reports she is compliant with a heart healthy diet and she restricts her salt intake. Patient also reports she restricts her fluid intake, but does not weigh herself daily as she does not have a scale. Patient reported she weighed at dialysis on Tuesdays and Saturdays. TOC to follow for possible needs.  Expected Discharge Plan: Home/Self Care Barriers to Discharge: Continued Medical Work up  Patient Goals and CMS Choice Patient states their goals for this hospitalization and ongoing recovery are:: Discharge home  Expected Discharge Plan and Services Expected Discharge Plan: Home/Self Care In-house Referral: Clinical Social Work Discharge Planning Services: NA Living arrangements for the past 2 months: Apartment  Prior Living Arrangements/Services Living arrangements for the past 2 months: Apartment Lives with:: Adult Children Patient language and need for interpreter reviewed:: Yes Do you feel safe going back to the place where you live?: Yes      Need for Family Participation in Patient Care: No (Comment) Care giver support system in place?: Yes (comment) Valetta Close (daughter)) Criminal Activity/Legal  Involvement Pertinent to Current Situation/Hospitalization: No - Comment as needed  Emotional Assessment Appearance:: Appears stated age Attitude/Demeanor/Rapport: Engaged Affect (typically observed): Accepting Orientation: : Oriented to Self, Oriented to Place, Oriented to  Time, Oriented to Situation Alcohol / Substance Use: Tobacco Use Psych Involvement: No (comment)  Admission diagnosis:  Acute exacerbation of CHF (congestive heart failure) ([place]) [I50.9] Patient Active Problem List   Diagnosis Date Noted  . Acute exacerbation of CHF (congestive heart failure) ([place]) 03/23/2020  . Volume overload 03/23/2020  . Orthopnea 03/23/2020  . Smokeless tobacco use 03/23/2020  . Acute pulmonary edema (HCC)   . SOB (shortness of breath)   . ESRD (end stage renal disease) ([place]) 03/09/2019  . Renal hematoma, left   . Acute on chronic systolic congestive heart failure ([place])   . Iron deficiency anemia   . CKD (chronic kidney disease), stage V ([place]) 01/07/2019  . Cough 01/07/2019  . Unstable angina ([place]) 05/29/2017  . CAD (coronary artery disease): total distal RCA EF normal (05/20/17) 05/25/2017  . Mixed hyperlipidemia   . NSTEMI (non-ST elevated myocardial infarction) ([place]) 05/20/2017  . History of stroke   . Chronic back pain 12/23/2015  . Hypotension 02/16/2015  . Asthma, chronic 02/16/2015  . Anxiety state 02/16/2015  . Nausea with vomiting 01/25/2013  . Abnormal LFTs 01/25/2013  . Orthostatic hypotension 01/25/2013  . Rectal bleeding 12/26/2012  . History of pulmonary embolism 05/03/2012  . Acute respiratory failure with hypoxia ([place]) 06/05/2011  . Hemorrhoids 06/04/2011  . 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 Immunosuppressed status ([place]) 06/04/2011  . Depression 06/04/2011  . Obesity 06/04/2011  . Essential hypertension 03/18/2011  . Idiopathic thrombocytopenic purpura ([place]) 03/18/2011   PCP:  Redmond School, MD Pharmacy:   [id number] [id number]: [phone]: [address] [address]Phone: [phone]: [phone] Readmission Risk Interventions Readmission Risk Prevention Plan 03/10/2019  Transportation Screening Complete  Medication Review (RN Care Manager) Referral to Pharmacy  Palliative Care Screening Not Applicable  Some recent data might be hidden

## 2020-03-23 NOTE — ED Notes (Signed)
Patient daughter voiced concerns to this nurse regarding patient diagnoses and treatments. Requesting to speak with Wynetta Emery, MD. Notified Dr. Wynetta Emery.

## 2020-03-23 NOTE — ED Notes (Signed)
Notified [facility] of Fentanyl order that needs to be verified.

## 2020-03-23 NOTE — ED Notes (Addendum)
Pt daughter at nurse's station with complaint of time lapsed in ER. Pt stated "I just want to know why my mom has not gotten a bed yet. We have been here since 9 am. If it's that big of a deal to be admitted then why is my mom still down here?" Beth, RN explained that the inpatient floor was short staffed and pt would get a bed as soon as staffing issue resolves.   Pt daughter reports asking nurses for pain medication but could not specify which nurse. This nurse was not informed of pain medication request.  Pt daughter complained that pt has not been given a meal tray. Informed pt daughter that the meal trays for dinner would be out shortly.  Beth, RN notified [facility] of pt request for pain meds for headache and chest pain. EKG exported on pt. Pt updated on plan of care.

## 2020-03-23 NOTE — H&P (Addendum)
History and Physical  [facility]  Summer Cook:[id number] DOB: [birth year] DOA: 03/23/2020  PCP: Redmond School, MD  Patient coming from: Home   I have personally briefly reviewed patient's old medical records in [place]  Chief Complaint: SOB   HPI: Summer Cook is a 54 y.o. female smokeless tobacco user with medical history significant of ESRD on hemodialysis with treatments given on Tuesdays and Saturdays only due to issues with her blood pressure she no longer receives Thursday treatments.  She is however supposed to be taking a lasix 80 mg tablet twice daily on nondialysis days because she still makes urine but she says she stopped taking it because she felt it wasn't effective at causing diuresis and she "urinated more when she was not taking it."  Two weeks ago she was treated with a course of antibiotics for pneumonia and I reviewed records from [facility] where she was seen in ED for acute systolic CHF and volume overload and she was discharged to go straight to hemodialysis for treatments.  She says she started to feel better after completing the last day of the antibiotics.  Unfortunately symptoms shortly began to resurface.  She says that for the past several days she has been unable to lie flat due to severe shortness of breath. She has been wheezing.  She reports that her pulse ox at home was in the 80s when lying recumbent and she was not able to sleep due to wheezing, cough, chest congestion, shortness of breath.  She was supposed to go to hemodialysis today but decided to come to the ER instead.  Her cough has been mostly nonproductive.  She denies fever and chills.  She denies nausea, vomiting and diarrhea.  She is more worried that she has pneumonia and does not believe her problem is due to the fluid overload because it "doesn't feel the same."  She wants a "CT scan."  She admits that she has had periods where she has missed dialysis for a week at a time but this  current shortness of breath feels different.  It started after her 2nd Covid 19 vaccine on June 2.  She denies chest pain and palpitations.   ED Course: Pt presented with HR of 103, BP 155/112, pulse ox 95% on room air.  She coughed with colorless sputum seen in small amounts.  BNP 2843, Na 134, K 3.7, Glucose 123, creatinine 5.49, Hg 11.2, chest xray with findings of pulmonary edema.  IV lasix 80 mg x 1 dose ordered in ED. Sars 2 Coronavirus test pending.   Nephrology was consulted for hemodialysis orders. Pt will be admitted for observation for further management.    Review of Systems: As per HPI otherwise 10 point review of systems negative.   Past Medical History:  Diagnosis Date  . Allergy   . Anxiety   . Arthritis   . Asthma   . CAD (coronary artery disease), native coronary artery    a. cath 05/20/17 - 100% distal PDA --> medical mangement (no intervention done)  . Candida esophagitis ([place])   . Chronic combined systolic and diastolic CHF (congestive heart failure) ([place])   . Depression   . End stage renal disease on dialysis [facility])    T/ Th/ Sat starting 4/28- [facility]  . Essential hypertension   . GERD (gastroesophageal reflux disease)   . Gout   . Headache   . Hemorrhoids   . History of kidney stones  passed  . 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  Last was Prednisone '40mg'$ ; 2 wk in 10/2012.  She also was on Prednisone bridged with Cellcept briefly but stopped due to lack of insurance.   . Myocardial infarction ([place])   . Perinephric hematoma   . Pneumonia   . Pulmonary embolism ([place]) 04/2012  . Serrated adenoma of colon   . Steroid-induced diabetes ([place])    "when taking Prednisone"  . Stroke ([place])   . Thrush, oral 05/29/2011    Past Surgical History:  Procedure Laterality Date  . AV FISTULA PLACEMENT Right 01/23/2019   Procedure: ARTERIOVENOUS (AV)  FISTULA CREATION RIGHT ARM;  Surgeon: Elam Dutch, MD;  Location: [facility];  Service: Vascular;  Laterality: Right;  . AV FISTULA PLACEMENT Right 01/30/2019   Procedure: GORETEX GRAFT PLACEMENT  RIGHT ARM;  Surgeon: Elam Dutch, MD;  Location: [facility] OR;  Service: Vascular;  Laterality: Right;  . AV FISTULA PLACEMENT Left 08/07/2019   Procedure: LEFT BRACHIOCEPHALIC VENOUS FISTULA;  Surgeon: Elam Dutch, MD;  Location: [facility];  Service: Vascular;  Laterality: Left;  . BONE MARROW BIOPSY    . CARDIAC CATHETERIZATION    . CARPAL TUNNEL RELEASE    . CARPAL TUNNEL RELEASE Left 05/20/2016   Procedure: CARPAL TUNNEL RELEASE;  Surgeon: Carole Civil, MD;  Location[facility];  Service: Orthopedics;  Laterality: Left;  . CARPAL TUNNEL RELEASE Right 07/16/2016   Procedure: RIGHT CARPAL TUNNEL RELEASE;  Surgeon: Carole Civil, MD;  Location: [facility];  Service: Orthopedics;  Laterality: Right;  . CATARACT EXTRACTION    . CHOLECYSTECTOMY  2008  . COLONOSCOPY WITH ESOPHAGOGASTRODUODENOSCOPY (EGD) N/A 01/04/2013   Dr. Gala Romney: esophageal plaques with +KOH, hh. Gastric antrum abnormal, bx reactive gastropathy. Anal canal hemorrhoids, colonic tics, normal TI, single polyp (sessile serrated tubular adenoma). Next TCS 12/2017  . IR FLUORO GUIDE CV LINE RIGHT  01/10/2019  . IR US GUIDE VASC ACCESS RIGHT  01/10/2019  . LEFT HEART CATH AND CORONARY ANGIOGRAPHY N/A 05/20/2017   Procedure: LEFT HEART CATH AND CORONARY ANGIOGRAPHY;  Surgeon: Martinique, Peter M, MD;  Location: [facility] CV LAB;  Service: Cardiovascular;  Laterality: N/A;  . 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 TEE WITHOUT CARDIOVERSION N/A 01/03/2016   Procedure: TRANSESOPHAGEAL ECHOCARDIOGRAM (TEE) WITH PROPOFOL;  Surgeon: Satira Sark, MD;  [facility];  Service: Cardiovascular;  Laterality: N/A;     reports that she quit smoking about 11 years ago. Her smoking use included cigarettes. She quit after 0.00 years of use. Her smokeless  tobacco use includes snuff. She reports that she does not drink alcohol and does not use drugs.  Allergies  Allergen Reactions  . Ciprofloxacin Nausea And Vomiting  . Ceftriaxone Rash  . Dilaudid [Hydromorphone Hcl] Anxiety and Other (See Comments)    Causes hallucinations, anxiety, and panic per patient  . [place] Venom Swelling    Swelling at injection site  . Adhesive [Tape] Rash    Please use paper tape  . Brintellix [Vortioxetine] Itching  . Cymbalta [Duloxetine Hcl] Itching  . Penicillins Itching    Did it involve swelling of the face/tongue/throat, SOB, or low BP? No Did it involve sudden or severe rash/hives, skin peeling, or any reaction on the inside of your mouth or nose? No Did you need to seek medical attention at a hospital or doctor's office? No When did it  last happen?54 yrs old If all above answers are "NO", may proceed with cephalosporin use.    Family History  Problem Relation Age of Onset  . Cervical cancer Mother   . Lung cancer Father   . Colon polyps Father        ???  . Pneumonia Brother   . Colon cancer Paternal Grandmother 88  . AAA (abdominal aortic aneurysm) Paternal Grandmother   . Breast cancer Paternal Grandmother   . Colon cancer Paternal Grandfather 28  . Barrett's esophagus Paternal Aunt   . Stomach cancer Neg Hx   . Pancreatic cancer Neg Hx      Prior to Admission medications   Medication Sig Start Date End Date Taking? Authorizing Provider  acetaminophen (TYLENOL) 500 MG tablet Take 1,000 mg by mouth every 6 (six) hours as needed for moderate pain.    [provider]  albuterol (PROVENTIL) (2.5 MG/3ML) 0.083% nebulizer solution Take 3 mLs (2.5 mg total) by nebulization every 4 (four) hours as needed for shortness of breath. 01/20/19   Elie Confer, MD  atorvastatin (LIPITOR) 40 MG tablet Take 1 tablet (40 mg total) by mouth daily at 6 PM. Patient not taking: Reported on 02/07/2020 01/20/19   Elie Confer, MD   calcium acetate (PHOSLO) 667 MG capsule Take 2 capsules by mouth See admin instructions. Three times daily with meals and snacks 07/04/19   [provider]  diclofenac sodium (VOLTAREN) 1 % GEL Apply 2 g topically 4 (four) times daily. Patient not taking: Reported on 02/07/2020 06/06/17   Debbe Odea, MD  furosemide (LASIX) 80 MG tablet Take 80 mg by mouth daily. 12/18/19   [provider]  hydrALAZINE (APRESOLINE) 50 MG tablet Take 0.5 tablets (25 mg total) by mouth 2 (two) times daily. 02/08/20   Barton Dubois, MD  isosorbide dinitrate (ISORDIL) 10 MG tablet Take 1 tablet (10 mg total) by mouth 2 (two) times daily. 01/26/19 02/07/20  Dunn, Nedra Hai, PA-C  lidocaine-prilocaine (EMLA) cream Apply 1 application topically as needed (numbing).  02/25/19   [provider]  loratadine (CLARITIN) 10 MG tablet Take 10 mg by mouth daily as needed for allergies.    [provider]  metoprolol succinate (TOPROL-XL) 50 MG 24 hr tablet Take 50 mg by mouth daily. Take with or immediately following a meal.    [provider]  midodrine (PROAMATINE) 5 MG tablet TAKE WHEN BLOOD PRESSURE IS LOW ON DIALYSIS. 09/06/19   [provider]  multivitamin (RENA-VIT) TABS tablet Take 1 tablet by mouth daily. 07/03/19   [provider]  nitroGLYCERIN (NITROSTAT) 0.4 MG SL tablet Place 1 tablet (0.4 mg total) under the tongue every 5 (five) minutes x 3 doses as needed for chest pain. 05/25/17   Delos Haring, PA-C  Oxycodone HCl 10 MG TABS Take 1 tablet (10 mg total) by mouth every 6 (six) hours as needed. 08/07/19   Dagoberto Ligas, PA-C  PROAIR HFA 108 (90 Base) MCG/ACT inhaler Inhale 2 puffs into the lungs every 4 (four) hours as needed for shortness of breath. 06/28/19   [provider]  sertraline (ZOLOFT) 100 MG tablet Take 100 mg by mouth daily.    [provider]  venlafaxine (EFFEXOR) 37.5 MG tablet Take 37.5 mg by mouth 2 (two) times daily.  12/22/18   [provider]    Physical Exam: Vitals:   03/23/20 0917 03/23/20 0927  BP:  (!) 155/112  Pulse:  (!) 103  Resp:  Marland Kitchen)  23  Temp:  97.9 F (36.6 C)  TempSrc:  Oral  SpO2:  95%  Weight: 85 kg   Height: '5\' 5"'$  (1.651 m)    Constitutional: Pt appears chronically ill, NAD, calm, comfortable Eyes: PERRL, lids and conjunctivae normal ENMT: Mucous membranes are moist. Posterior pharynx clear of any exudate or lesions.Normal dentition.  Neck: normal, supple, no masses, no thyromegaly Respiratory: bibasilar crackles, no wheezing, no rales. Mild tachypnea. No accessory muscle use.  Cardiovascular: normal s1,s2 sounds, no murmurs / rubs / gallops. No extremity edema. 2+ pedal pulses. No carotid bruits.  Abdomen: no tenderness, no masses palpated. No hepatosplenomegaly. Bowel sounds positive.  Musculoskeletal: no clubbing / cyanosis. No joint deformity upper and lower extremities. Good ROM, no contractures. Normal muscle tone.  Skin: no rashes, lesions, ulcers. No induration Neurologic: CN 2-12 grossly intact. Sensation intact, DTR normal. Strength 5/5 in all 4.  Psychiatric: Normal judgment and insight. Alert and oriented x 3. Normal mood.   Labs on Admission: I have personally reviewed following labs and imaging studies  CBC: Recent Labs  Lab 03/23/20 1051  WBC 6.9  NEUTROABS 6.3  HGB 11.2*  HCT 35.8*  MCV 92.3  PLT 161   Basic Metabolic Panel: Recent Labs  Lab 03/23/20 1051  NA 134*  K 3.7  CL 99  CO2 19*  GLUCOSE 123*  BUN 62*  CREATININE 5.49*  CALCIUM 8.6*   GFR: Estimated Creatinine Clearance: 12.6 mL/min (A) (by C-G formula based on SCr of 5.49 mg/dL (H)). Liver Function Tests: No results for input(s): AST, ALT, ALKPHOS, BILITOT, PROT, ALBUMIN in the last 168 hours. No results for input(s): LIPASE, AMYLASE in the last 168 hours. No results for input(s): AMMONIA in the last 168 hours. Coagulation Profile: No results for input(s): INR, PROTIME  in the last 168 hours. Cardiac Enzymes: No results for input(s): CKTOTAL, CKMB, CKMBINDEX, TROPONINI in the last 168 hours. BNP (last 3 results) No results for input(s): PROBNP in the last 8760 hours. HbA1C: No results for input(s): HGBA1C in the last 72 hours. CBG: No results for input(s): GLUCAP in the last 168 hours. Lipid Profile: No results for input(s): CHOL, HDL, LDLCALC, TRIG, CHOLHDL, LDLDIRECT in the last 72 hours. Thyroid Function Tests: No results for input(s): TSH, T4TOTAL, FREET4, T3FREE, THYROIDAB in the last 72 hours. Anemia Panel: No results for input(s): VITAMINB12, FOLATE, FERRITIN, TIBC, IRON, RETICCTPCT in the last 72 hours. Urine analysis:    Component Value Date/Time   COLORURINE YELLOW (A) 03/12/2019 1847   APPEARANCEUR CLOUDY (A) 03/12/2019 1847   LABSPEC 1.008 03/12/2019 1847   PHURINE 8.0 03/12/2019 1847   GLUCOSEU NEGATIVE 03/12/2019 1847   HGBUR SMALL (A) 03/12/2019 1847   BILIRUBINUR NEGATIVE 03/12/2019 1847   KETONESUR NEGATIVE 03/12/2019 1847   PROTEINUR 100 (A) 03/12/2019 1847   UROBILINOGEN 0.2 02/17/2015 1325   NITRITE NEGATIVE 03/12/2019 1847   LEUKOCYTESUR LARGE (A) 03/12/2019 1847   Radiological Exams on Admission: DG Chest Portable 1 View  Result Date: 03/23/2020 CLINICAL DATA:  54 year old with shortness of breath. End-stage renal disease. EXAM: PORTABLE CHEST 1 VIEW COMPARISON:  03/08/2020 FINDINGS: Stable appearance of the left jugular dialysis catheter with the tip in the right atrium. Persistent densities at the left costophrenic angle likely represent a small left pleural effusion. Some hazy and patchy densities in the lateral right lung base have slightly progressed. Slightly increased interstitial lung densities are suggestive for mild edema. Heart size is within normal limits and stable. Prominent azygos vein shadow.  Negative for pneumothorax. IMPRESSION: 1. Evidence for mild interstitial pulmonary edema. 2. Persistent basilar  densities probably represent small pleural effusions particularly on the left side. Hazy and indistinct densities near the right costophrenic angle are indeterminate and could represent a small focus of airspace disease or atelectasis. Electronically Signed   By: Markus Daft M.D.   On: 03/23/2020 09:57   EKG: Independently reviewed. Sinus tachycardia   Assessment/Plan Principal Problem:   Acute exacerbation of CHF (congestive heart failure) (HCC) Active Problems:   Essential hypertension   Immunosuppressed status (HCC)   Acute respiratory failure with hypoxia (HCC)   History of pulmonary embolism   Orthostatic hypotension   Asthma, chronic   Anxiety state   History of stroke   CAD (coronary artery disease): total distal RCA EF normal (05/20/17)   Unstable angina (HCC)   Acute on chronic systolic congestive heart failure (HCC)   ESRD (end stage renal disease) (HCC)   Acute pulmonary edema (HCC)   SOB (shortness of breath)   Volume overload   Orthopnea   Smokeless tobacco use  1. Acute combined systolic / diastolic heart failure - Pt is volume overloaded and will be treated with hemodialysis.  Appreciate assistance of nephrology team.  IV lasix 80 mg ordered by ED physician.  Pt had a 2D echo in Oct 2020 with findings of EF 40% and grade 1 diastolic dysfunction.   2. Acute respiratory failure with hypoxia - await Sars 2 coronovirus test.  Treat volume overload.  Pt is convinced she has pneumonia.  I told her that I couldn't find any evidence of pneumonia on today's CXR or the CXR from 2 weeks ago.  I told her after diuresis I would order a repeat CXR to be sure that there is no hidden pneumonia seen that was masked by the fluid.    3. Acute pulmonary edema - managed by hemodialysis and supportive care, supplemental oxygen.  4. ESRD on HD - Pt is on an atypical schedule that is mostly Tues and Saturdays. Nephrology consult appreciated.  HD orders per nephrology.  5. CAD - stable, asked for  home meds to be reconciled so that they can be resumed.  6. History of CVA - stable.  7. Essential hypertension - resume home meds when reconciled.    DVT prophylaxis: subcut heparin   Code Status: Full   Family Communication: daughter   Disposition Plan: home   Consults called: nephrology   Admission status: OBS   Irwin Brakeman MD [facility] How to contact the [facility] Attending or Consulting provider [place] or covering provider during after hours [place], for this patient?  1. Check the care team in [facility] and look for a) attending/consulting TR[facility] provider listed and b) the [facility] team listed 2. Log into [email or web address] and use [place]'s universal password to access. If you do not have the password, please contact the hospital operator. 3. Locate the [facility] provider you are looking for under [facility] and page to a number that you can be directly reached. 4. If you still have difficulty reaching the provider, please page the [facility] (Director on Call) for the Hospitalists listed on amion for assistance.   If 7PM-7AM, please contact night-coverage [email or web address] Password [facility]  03/23/2020, 1:04 PM

## 2020-03-23 NOTE — ED Provider Notes (Signed)
Summer Cook   CSN: [id number] Arrival date & time: 03/23/20  7169     History Chief Complaint  Patient presents with  . Shortness of Breath    Summer Cook is a 54 y.o. female.  Pt presents to the ED today with SOB.  The pt said she was diagnosed with pneumonia at [facility] about 2 weeks ago.  She was given 2 abx in the ED (Rocephin and Zithromax?) and d/c home with Doxycycline.  She said she can't take Doxy, so she called her PCP who called in Zithromax.  Pt took the Zithromax and seemed to feel like she was getting better.  Pt is also an ESRD patient who goes to dialysis on Tuesday and Saturday.  She does not go on Thursday because she said they took off too much fluid.  Pt still urinates and is on Lasix 80 mg bid.  She has not taken it this week because she thinks she urinates better without it.  Pt said she became very sob last night.  She started coughing a lot.  She could not lay back because her oxygen sats dropped to the mid-80s.  She tried sleeping in a chair, but could not sleep due to the coughing.  Pt did not go to dialysis today, but came here.  She does go to [facility] in [place].        Past Medical History:  Diagnosis Date  . Allergy   . Anxiety   . Arthritis   . Asthma   . CAD (coronary artery disease), native coronary artery    a. cath 05/20/17 - 100% distal PDA --> medical mangement (no intervention done)  . Candida esophagitis ([place])   . Chronic combined systolic and diastolic CHF (congestive heart failure) ([address])   . Depression   . End stage renal disease on dialysis [facility])    T/ Th/ Sat starting 4/28- [facility]  . Essential hypertension   . GERD (gastroesophageal reflux disease)   . Gout   . Headache   . Hemorrhoids   . History of kidney stones    passed  . ITP (idiopathic thrombocytopenic purpura) 08/2010   Treated with Prednisone, IVIg (transient response), Rituxan (no response), Cytoxan (no  response).  Last was Prednisone 57m; 2 wk in 10/2012.  She also was on Prednisone bridged with Cellcept briefly but stopped due to lack of insurance.   . Myocardial infarction ([facility]   . Perinephric hematoma   . Pneumonia   . Pulmonary embolism ([place] 04/2012  . Serrated adenoma of colon   . Steroid-induced diabetes ([place]    "when taking Prednisone"  . Stroke ([place]   . Thrush, oral 05/29/2011    Patient Active Problem List   Diagnosis Date Noted  . Acute exacerbation of CHF (congestive heart failure) ([facility] 03/23/2020  . Volume overload 03/23/2020  . Orthopnea 03/23/2020  . Smokeless tobacco use 03/23/2020  . Acute pulmonary edema (HCC)   . SOB (shortness of breath)   . ESRD (end stage renal disease) ([facility] 03/09/2019  . Renal hematoma, left   . Acute on chronic systolic congestive heart failure ([facility]   . Iron deficiency anemia   . CKD (chronic kidney disease), stage V ([place] 01/07/2019  . Cough 01/07/2019  . Unstable angina ([place] 05/29/2017  . CAD (coronary artery disease): total distal RCA EF normal (05/20/17) 05/25/2017  . Mixed hyperlipidemia   . NSTEMI (non-ST elevated myocardial infarction) ([facility] 05/20/2017  . Diabetes  mellitus type 2, uncomplicated ([place]) 66/44/0347  . History of stroke   . Chronic back pain 12/23/2015  . Hypotension 02/16/2015  . Asthma, chronic 02/16/2015  . Anxiety state 02/16/2015  . Nausea with vomiting 01/25/2013  . Abnormal LFTs 01/25/2013  . Orthostatic hypotension 01/25/2013  . Rectal bleeding 12/26/2012  . History of pulmonary embolism 05/03/2012  . Acute respiratory failure with hypoxia ([place]) 06/05/2011  . Hemorrhoids 06/04/2011  . 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 Immunosuppressed status ([place]) 06/04/2011  . Depression 06/04/2011  . Obesity 06/04/2011  . Essential hypertension 03/18/2011  . Idiopathic thrombocytopenic purpura ([place]) 03/18/2011    Past Surgical History:  Procedure Laterality Date  . AV FISTULA PLACEMENT Right 01/23/2019    Procedure: ARTERIOVENOUS (AV) FISTULA CREATION RIGHT ARM;  Surgeon: Elam Dutch, MD;  Location: [facility];  Service: Vascular;  Laterality: Right;  . AV FISTULA PLACEMENT Right 01/30/2019   Procedure: GORETEX GRAFT PLACEMENT  RIGHT ARM;  Surgeon: Elam Dutch, MD;  Location: [facility];  Service: Vascular;  Laterality: Right;  . AV FISTULA PLACEMENT Left 08/07/2019   Procedure: LEFT BRACHIOCEPHALIC VENOUS FISTULA;  Surgeon: Elam Dutch, MD;  Location: [facility];  Service: Vascular;  Laterality: Left;  . BONE MARROW BIOPSY    . CARDIAC CATHETERIZATION    . CARPAL TUNNEL RELEASE    . CARPAL TUNNEL RELEASE Left 05/20/2016   Procedure: CARPAL TUNNEL RELEASE;  Surgeon: Carole Civil, MD;  Location: [facility];  Service: Orthopedics;  Laterality: Left;  . CARPAL TUNNEL RELEASE Right 07/16/2016   Procedure: RIGHT CARPAL TUNNEL RELEASE;  Surgeon: Carole Civil, MD;  Location: [facility];  Service: Orthopedics;  Laterality: Right;  . CATARACT EXTRACTION    . CHOLECYSTECTOMY  2008  . COLONOSCOPY WITH ESOPHAGOGASTRODUODENOSCOPY (EGD) N/A 01/04/2013   Dr. Gala Romney: esophageal plaques with +KOH, hh. Gastric antrum abnormal, bx reactive gastropathy. Anal canal hemorrhoids, colonic tics, normal TI, single polyp (sessile serrated tubular adenoma). Next TCS 12/2017  . IR FLUORO GUIDE CV LINE RIGHT  01/10/2019  . IR US GUIDE VASC ACCESS RIGHT  01/10/2019  . LEFT HEART CATH AND CORONARY ANGIOGRAPHY N/A 05/20/2017   Procedure: LEFT HEART CATH AND CORONARY ANGIOGRAPHY;  Surgeon: Martinique, Peter M, MD;  Location: [facility];  Service: Cardiovascular;  Laterality: N/A;  . 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 TEE WITHOUT CARDIOVERSION N/A 01/03/2016   Procedure: TRANSESOPHAGEAL ECHOCARDIOGRAM (TEE) WITH PROPOFOL;  Surgeon: Satira Sark, MD;  [facility];  Service: Cardiovascular;  Laterality: N/A;     OB History    Gravida      Para      Term      Preterm      AB      Living  1     SAB       TAB      Ectopic      Multiple      Live Births              Family History  Problem Relation Age of Onset  . Cervical cancer Mother   . Lung cancer Father   . Colon polyps Father        ???  . Pneumonia Brother   . Colon cancer Paternal Grandmother 88  . AAA (abdominal aortic aneurysm) Paternal Grandmother   . Breast cancer Paternal Grandmother   . Colon cancer Paternal Grandfather 37  . Barrett's esophagus Paternal Aunt   . Stomach cancer Neg Hx   .  Pancreatic cancer Neg Hx     Social History   Tobacco Use  . Smoking status: Former Smoker    Years: 0.00    Types: Cigarettes    Quit date: 02/14/2009    Years since quitting: 11.1  . Smokeless tobacco: Current User    Types: Snuff  Vaping Use  . Vaping Use: Never used  Substance Use Topics  . Alcohol use: No    Alcohol/week: 0.0 standard drinks  . Drug use: No    Home Medications Prior to Admission medications   Medication Sig Start Date End Date Taking? Authorizing Provider  acetaminophen (TYLENOL) 500 MG tablet Take 1,000 mg by mouth every 6 (six) hours as needed for moderate pain.    [provider]  albuterol (PROVENTIL) (2.5 MG/3ML) 0.083% nebulizer solution Take 3 mLs (2.5 mg total) by nebulization every 4 (four) hours as needed for shortness of breath. 01/20/19   Elie Confer, MD  atorvastatin (LIPITOR) 40 MG tablet Take 1 tablet (40 mg total) by mouth daily at 6 PM. Patient not taking: Reported on 02/07/2020 01/20/19   Elie Confer, MD  calcium acetate (PHOSLO) 667 MG capsule Take 2 capsules by mouth See admin instructions. Three times daily with meals and snacks 07/04/19   [provider]  diclofenac sodium (VOLTAREN) 1 % GEL Apply 2 g topically 4 (four) times daily. Patient not taking: Reported on 02/07/2020 06/06/17   Debbe Odea, MD  furosemide (LASIX) 80 MG tablet Take 80 mg by mouth daily. 12/18/19   [provider]  hydrALAZINE (APRESOLINE) 50 MG tablet Take 0.5  tablets (25 mg total) by mouth 2 (two) times daily. 02/08/20   Barton Dubois, MD  isosorbide dinitrate (ISORDIL) 10 MG tablet Take 1 tablet (10 mg total) by mouth 2 (two) times daily. 01/26/19 02/07/20  Dunn, Nedra Hai, PA-C  lidocaine-prilocaine (EMLA) cream Apply 1 application topically as needed (numbing).  02/25/19   [provider]  loratadine (CLARITIN) 10 MG tablet Take 10 mg by mouth daily as needed for allergies.    [provider]  metoprolol succinate (TOPROL-XL) 50 MG 24 hr tablet Take 50 mg by mouth daily. Take with or immediately following a meal.    [provider]  midodrine (PROAMATINE) 5 MG tablet TAKE WHEN BLOOD PRESSURE IS LOW ON DIALYSIS. 09/06/19   [provider]  multivitamin (RENA-VIT) TABS tablet Take 1 tablet by mouth daily. 07/03/19   [provider]  nitroGLYCERIN (NITROSTAT) 0.4 MG SL tablet Place 1 tablet (0.4 mg total) under the tongue every 5 (five) minutes x 3 doses as needed for chest pain. 05/25/17   Delos Haring, PA-C  Oxycodone HCl 10 MG TABS Take 1 tablet (10 mg total) by mouth every 6 (six) hours as needed. 08/07/19   Dagoberto Ligas, PA-C  PROAIR HFA 108 (90 Base) MCG/ACT inhaler Inhale 2 puffs into the lungs every 4 (four) hours as needed for shortness of breath. 06/28/19   [provider]  sertraline (ZOLOFT) 100 MG tablet Take 100 mg by mouth daily.    [provider]  venlafaxine (EFFEXOR) 37.5 MG tablet Take 37.5 mg by mouth 2 (two) times daily. 12/22/18   [provider]    Allergies    Ciprofloxacin, Ceftriaxone, Dilaudid [hydromorphone hcl], Yellow jacket venom, Adhesive [tape], Brintellix [vortioxetine], Cymbalta [duloxetine hcl], and Penicillins  Review of Systems   Review of Systems  Respiratory: Positive for cough and shortness of breath.   All other systems reviewed  and are negative.   Physical Exam Updated Vital Signs BP (!) 155/112 (BP Location: Right Arm)   Pulse (!) 103    Temp 97.9 F (36.6 C) (Oral)   Resp (!) 23   Ht _0  (1.651 m)   Wt 85 kg   LMP 08/13/2011   SpO2 95%   BMI 31.18 kg/m   Physical Exam Vitals and nursing Cook reviewed.  Constitutional:      Appearance: She is well-developed.  HENT:     Head: Normocephalic and atraumatic.     Mouth/Throat:     Mouth: Mucous membranes are moist.     Pharynx: Oropharynx is clear.  Eyes:     Extraocular Movements: Extraocular movements intact.     Pupils: Pupils are equal, round, and reactive to light.  Cardiovascular:     Rate and Rhythm: Regular rhythm. Tachycardia present.  Pulmonary:     Effort: Tachypnea present.     Breath sounds: Rhonchi present.  Abdominal:     General: Bowel sounds are normal.     Palpations: Abdomen is soft.  Musculoskeletal:        General: Normal range of motion.     Cervical back: Normal range of motion and neck supple.     Right lower leg: Edema present.     Left lower leg: Edema present.  Skin:    General: Skin is warm.     Capillary Refill: Capillary refill takes less than 2 seconds.  Neurological:     General: No focal deficit present.     Mental Status: She is alert and oriented to person, place, and time.  Psychiatric:        Mood and Affect: Mood normal.        Behavior: Behavior normal.     ED Results / Procedures / Treatments   Labs (all labs ordered are listed, but only abnormal results are displayed) Labs Reviewed  BASIC METABOLIC PANEL - Abnormal; Notable for the following components:      Result Value   Sodium 134 (*)    CO2 19 (*)    Glucose, Bld 123 (*)    BUN 62 (*)    Creatinine, Ser 5.49 (*)    Calcium 8.6 (*)    GFR calc non Af Amer 8 (*)    GFR calc Af Amer 9 (*)    Anion gap 16 (*)    All other components within normal limits  BRAIN NATRIURETIC PEPTIDE - Abnormal; Notable for the following components:   B Natriuretic Peptide 2,843.0 (*)    All other components within normal limits  CBC WITH DIFFERENTIAL/PLATELET -  Abnormal; Notable for the following components:   Hemoglobin 11.2 (*)    HCT 35.8 (*)    RDW 15.6 (*)    Lymphs Abs 0.4 (*)    All other components within normal limits  SARS CORONAVIRUS 2 BY RT PCR [facility] ORDER, [place] LAB)    EKG EKG Interpretation  Date/Time:  Saturday March 23 2020 09:27:07 EDT Ventricular Rate:  102 PR Interval:    QRS Duration: 147 QT Interval:  412 QTC Calculation: 537 R Axis:   17 Text Interpretation: Sinus tachycardia Left atrial enlargement Left bundle branch block No significant change since last tracing Confirmed by Isla Pence [phone]) on 03/23/2020 11:33:15 AM   Radiology DG Chest Portable 1 View  Result Date: 03/23/2020 CLINICAL DATA:  54 year old with shortness of breath. End-stage renal disease. EXAM: PORTABLE CHEST 1 VIEW  COMPARISON:  03/08/2020 FINDINGS: Stable appearance of the left jugular dialysis catheter with the tip in the right atrium. Persistent densities at the left costophrenic angle likely represent a small left pleural effusion. Some hazy and patchy densities in the lateral right lung base have slightly progressed. Slightly increased interstitial lung densities are suggestive for mild edema. Heart size is within normal limits and stable. Prominent azygos vein shadow. Negative for pneumothorax. IMPRESSION: 1. Evidence for mild interstitial pulmonary edema. 2. Persistent basilar densities probably represent small pleural effusions particularly on the left side. Hazy and indistinct densities near the right costophrenic angle are indeterminate and could represent a small focus of airspace disease or atelectasis. Electronically Signed   By: Markus Daft M.D.   On: 03/23/2020 09:57    Procedures Procedures (including critical care time)  Medications Ordered in ED Medications  Chlorhexidine Gluconate Cloth 2 % PADS 6 each (has no administration in time range)  pentafluoroprop-tetrafluoroeth (GEBAUERS) aerosol 1  application (has no administration in time range)  lidocaine (PF) (XYLOCAINE) 1 % injection 5 mL (has no administration in time range)  lidocaine-prilocaine (EMLA) cream 1 application (has no administration in time range)  0.9 %  sodium chloride infusion (has no administration in time range)  0.9 %  sodium chloride infusion (has no administration in time range)  furosemide (LASIX) injection 80 mg (has no administration in time range)  ondansetron (ZOFRAN) injection 4 mg (4 mg Intravenous Given 03/23/20 1142)  fentaNYL (SUBLIMAZE) injection 50 mcg (50 mcg Intravenous Given 03/23/20 1141)    ED Course  I have reviewed the triage vital signs and the nursing notes.  Pertinent labs & imaging results that were available during my care of the patient were reviewed by me and considered in my medical decision making (see chart for details).    MDM Rules/Calculators/A&P                          Pt is fluid overloaded today.  I tried to get ahold of [facility] and could not talk to a person.  Pt said they don't take any dialysis patients after 11 because they close at 3pm.    Pt d/w Dr. Hollie Salk (nephrology) who recommends inpatient dialysis as pt can't wait until next Tuesday (6/28).  Patient was d/w Dr. Wynetta Emery (triad) who will admit.  Covid swab is pending, but pt is fully vaccinated.  CRITICAL CARE Performed by: Isla Pence   Total critical care time: 30 minutes  Critical care time was exclusive of separately billable procedures and treating other patients.  Critical care was necessary to treat or prevent imminent or life-threatening deterioration.  Critical care was time spent personally by me on the following activities: development of treatment plan with patient and/or surrogate as well as nursing, discussions with consultants, evaluation of patient's response to treatment, examination of patient, obtaining history from patient or surrogate, ordering and performing treatments  and interventions, ordering and review of laboratory studies, ordering and review of radiographic studies, pulse oximetry and re-evaluation of patient's condition. Final Clinical Impression(s) / ED Diagnoses Final diagnoses:  ESRD on hemodialysis ([place])  Acute pulmonary edema ([place])    Rx / DC Orders ED Discharge Orders    None       Isla Pence, MD 03/23/20 1305

## 2020-03-23 NOTE — ED Triage Notes (Signed)
Pt states she was diagnosed with pneumonia x 2 weeks ago and was given antibiotics and pt states she was getting better but the last couple of days she has been having increased sob and coughing

## 2020-03-24 ENCOUNTER — Observation Stay (HOSPITAL_COMMUNITY): Payer: Medicare Other

## 2020-03-24 DIAGNOSIS — J9811 Atelectasis: Secondary | ICD-10-CM | POA: Diagnosis not present

## 2020-03-24 DIAGNOSIS — J810 Acute pulmonary edema: Secondary | ICD-10-CM | POA: Diagnosis not present

## 2020-03-24 DIAGNOSIS — E669 Obesity, unspecified: Secondary | ICD-10-CM | POA: Diagnosis present

## 2020-03-24 DIAGNOSIS — J9601 Acute respiratory failure with hypoxia: Secondary | ICD-10-CM | POA: Diagnosis not present

## 2020-03-24 DIAGNOSIS — I2510 Atherosclerotic heart disease of native coronary artery without angina pectoris: Secondary | ICD-10-CM | POA: Diagnosis not present

## 2020-03-24 DIAGNOSIS — R0902 Hypoxemia: Secondary | ICD-10-CM | POA: Diagnosis not present

## 2020-03-24 DIAGNOSIS — I120 Hypertensive chronic kidney disease with stage 5 chronic kidney disease or end stage renal disease: Secondary | ICD-10-CM | POA: Diagnosis not present

## 2020-03-24 DIAGNOSIS — E8889 Other specified metabolic disorders: Secondary | ICD-10-CM | POA: Diagnosis present

## 2020-03-24 DIAGNOSIS — Z86711 Personal history of pulmonary embolism: Secondary | ICD-10-CM | POA: Diagnosis not present

## 2020-03-24 DIAGNOSIS — Z9081 Acquired absence of spleen: Secondary | ICD-10-CM | POA: Diagnosis not present

## 2020-03-24 DIAGNOSIS — J189 Pneumonia, unspecified organism: Secondary | ICD-10-CM | POA: Diagnosis present

## 2020-03-24 DIAGNOSIS — J302 Other seasonal allergic rhinitis: Secondary | ICD-10-CM | POA: Diagnosis present

## 2020-03-24 DIAGNOSIS — M109 Gout, unspecified: Secondary | ICD-10-CM | POA: Diagnosis present

## 2020-03-24 DIAGNOSIS — D849 Immunodeficiency, unspecified: Secondary | ICD-10-CM | POA: Diagnosis present

## 2020-03-24 DIAGNOSIS — R918 Other nonspecific abnormal finding of lung field: Secondary | ICD-10-CM | POA: Diagnosis not present

## 2020-03-24 DIAGNOSIS — Z8673 Personal history of transient ischemic attack (TIA), and cerebral infarction without residual deficits: Secondary | ICD-10-CM | POA: Diagnosis not present

## 2020-03-24 DIAGNOSIS — J811 Chronic pulmonary edema: Secondary | ICD-10-CM | POA: Diagnosis not present

## 2020-03-24 DIAGNOSIS — N186 End stage renal disease: Secondary | ICD-10-CM | POA: Diagnosis not present

## 2020-03-24 DIAGNOSIS — E1122 Type 2 diabetes mellitus with diabetic chronic kidney disease: Secondary | ICD-10-CM | POA: Diagnosis present

## 2020-03-24 DIAGNOSIS — I5043 Acute on chronic combined systolic (congestive) and diastolic (congestive) heart failure: Secondary | ICD-10-CM | POA: Diagnosis not present

## 2020-03-24 DIAGNOSIS — K219 Gastro-esophageal reflux disease without esophagitis: Secondary | ICD-10-CM | POA: Diagnosis present

## 2020-03-24 DIAGNOSIS — Z992 Dependence on renal dialysis: Secondary | ICD-10-CM | POA: Diagnosis not present

## 2020-03-24 DIAGNOSIS — E782 Mixed hyperlipidemia: Secondary | ICD-10-CM | POA: Diagnosis present

## 2020-03-24 DIAGNOSIS — J90 Pleural effusion, not elsewhere classified: Secondary | ICD-10-CM | POA: Diagnosis not present

## 2020-03-24 DIAGNOSIS — Z720 Tobacco use: Secondary | ICD-10-CM | POA: Diagnosis not present

## 2020-03-24 DIAGNOSIS — I252 Old myocardial infarction: Secondary | ICD-10-CM | POA: Diagnosis not present

## 2020-03-24 DIAGNOSIS — F411 Generalized anxiety disorder: Secondary | ICD-10-CM | POA: Diagnosis present

## 2020-03-24 DIAGNOSIS — J440 Chronic obstructive pulmonary disease with acute lower respiratory infection: Secondary | ICD-10-CM | POA: Diagnosis present

## 2020-03-24 DIAGNOSIS — D631 Anemia in chronic kidney disease: Secondary | ICD-10-CM | POA: Diagnosis not present

## 2020-03-24 DIAGNOSIS — Z20822 Contact with and (suspected) exposure to covid-19: Secondary | ICD-10-CM | POA: Diagnosis present

## 2020-03-24 DIAGNOSIS — E8770 Fluid overload, unspecified: Secondary | ICD-10-CM | POA: Diagnosis not present

## 2020-03-24 DIAGNOSIS — N250 Renal osteodystrophy: Secondary | ICD-10-CM | POA: Diagnosis not present

## 2020-03-24 DIAGNOSIS — I132 Hypertensive heart and chronic kidney disease with heart failure and with stage 5 chronic kidney disease, or end stage renal disease: Secondary | ICD-10-CM | POA: Diagnosis present

## 2020-03-24 LAB — GLUCOSE, CAPILLARY
Glucose-Capillary: 84 mg/dL (ref 70–99)
Glucose-Capillary: 81 mg/dL (ref 70–99)
Glucose-Capillary: 94 mg/dL (ref 70–99)
Glucose-Capillary: 74 mg/dL (ref 70–99)
Glucose-Capillary: 89 mg/dL (ref 70–99)

## 2020-03-24 LAB — BASIC METABOLIC PANEL
Anion gap: 17 — ABNORMAL HIGH (ref 5–15)
BUN: 68 mg/dL — ABNORMAL HIGH (ref 6–20)
CO2: 18 mmol/L — ABNORMAL LOW (ref 22–32)
Calcium: 8.1 mg/dL — ABNORMAL LOW (ref 8.9–10.3)
Chloride: 99 mmol/L (ref 98–111)
Creatinine, Ser: 5.79 mg/dL — ABNORMAL HIGH (ref 0.44–1.00)
GFR calc Af Amer: 9 mL/min — ABNORMAL LOW (ref >60)
GFR calc non Af Amer: 8 mL/min — ABNORMAL LOW (ref >60)
Glucose, Bld: 92 mg/dL (ref 70–99)
Potassium: 3.5 mmol/L (ref 3.5–5.1)
Sodium: 134 mmol/L — ABNORMAL LOW (ref 135–145)

## 2020-03-24 MED ORDER — HEPARIN SODIUM (PORCINE) 1000 UNIT/ML IJ SOLN
3800.0000 [IU] | Freq: Once | INTRAMUSCULAR | Status: AC
  Administered 2020-03-24: 3800 [IU]
  Filled 2020-03-24: qty 4

## 2020-03-24 MED ORDER — GUAIFENESIN-DM 100-10 MG/5ML PO SYRP
15.0000 mL | ORAL_SOLUTION | ORAL | Status: DC | PRN
  Administered 2020-03-24: 15 mL via ORAL
  Filled 2020-03-24: qty 15

## 2020-03-24 MED ORDER — IPRATROPIUM-ALBUTEROL 0.5-2.5 (3) MG/3ML IN SOLN
3.0000 mL | Freq: Three times a day (TID) | RESPIRATORY_TRACT | Status: DC
  Administered 2020-03-24 – 2020-03-25 (×2): 3 mL via RESPIRATORY_TRACT
  Filled 2020-03-24 (×2): qty 3

## 2020-03-24 MED ORDER — CALCIUM ACETATE (PHOS BINDER) 667 MG PO CAPS
667.0000 mg | ORAL_CAPSULE | Freq: Three times a day (TID) | ORAL | Status: DC
  Administered 2020-03-24 – 2020-03-25 (×3): 667 mg via ORAL
  Filled 2020-03-24 (×3): qty 1

## 2020-03-24 NOTE — Consult Note (Signed)
Reason for Consult: To manage dialysis and dialysis related needs Referring Physician: Johnson  Summer Cook is an 54 y.o. female with HTN, gout, history of ITP, also s/p CVA.  Apparently she is ESRD but only gets HD on Tuesdays and Saturdays at [facility].   She is s/p a hospitalization here in May for volume overload at which time HD 3 days a week was recommended.  She came to ER yesterday with orthopnea and hypoxia- CXR showing pulmonary edema and effusions.  She was admitted for obs to essentially get dialysis.  Dialysis has been ordered for today and HD nurse is aware   Dialyzes at [facility]- other details of OP HD now known except for only 2 days a week - she says EDW 85- doesn't think she has lost weight but says she has been sick.  Still not using AVF  Past Medical History:  Diagnosis Date  . Allergy   . Anxiety   . Arthritis   . Asthma   . CAD (coronary artery disease), native coronary artery    a. cath 05/20/17 - 100% distal PDA --> medical mangement (no intervention done)  . Candida esophagitis (HCC)   . Chronic combined systolic and diastolic CHF (congestive heart failure) (HCC)   . Depression   . End stage renal disease on dialysis (HCC)    T/ Th/ Sat starting 4/28- [facility]  . Essential hypertension   . GERD (gastroesophageal reflux disease)   . Gout   . Headache   . Hemorrhoids   . History of kidney stones    passed  . 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  Last was Prednisone 40mg; 2 wk in 10/2012.  She also was on Prednisone bridged with Cellcept briefly but stopped due to lack of insurance.   . Myocardial infarction (HCC)   . Perinephric hematoma   . Pneumonia   . Pulmonary embolism (HCC) 04/2012  . Serrated adenoma of colon   . Steroid-induced diabetes (HCC)    "when taking Prednisone"  . Stroke (HCC)   . Thrush, oral  05/29/2011    Past Surgical History:  Procedure Laterality Date  . AV FISTULA PLACEMENT Right 01/23/2019   Procedure: ARTERIOVENOUS (AV) FISTULA CREATION RIGHT ARM;  Surgeon: Fields, Charles E, MD;  Location: [facility] OR;  Service: Vascular;  Laterality: Right;  . AV FISTULA PLACEMENT Right 01/30/2019   Procedure: GORETEX GRAFT PLACEMENT  RIGHT ARM;  Surgeon: Fields, Charles E, MD;  Location: [facility] OR;  Service: Vascular;  Laterality: Right;  . AV FISTULA PLACEMENT Left 08/07/2019   Procedure: LEFT BRACHIOCEPHALIC VENOUS FISTULA;  Surgeon: Fields, Charles E, MD;  Location: [facility] OR;  Service: Vascular;  Laterality: Left;  . BONE MARROW BIOPSY    . CARDIAC CATHETERIZATION    . CARPAL TUNNEL RELEASE    . CARPAL TUNNEL RELEASE Left 05/20/2016   Procedure: CARPAL TUNNEL RELEASE;  Surgeon: Stanley E Harrison, MD;  Location: [facility]S;  Service: Orthopedics;  Laterality: Left;  . CARPAL TUNNEL RELEASE Right 07/16/2016   Procedure: RIGHT CARPAL TUNNEL RELEASE;  Surgeon: Stanley E Harrison, MD;  Location: [facility];  Service: Orthopedics;  Laterality: Right;  . CATARACT EXTRACTION    . CHOLECYSTECTOMY  2008  . COLONOSCOPY WITH ESOPHAGOGASTRODUODENOSCOPY (EGD) N/A 01/04/2013   Dr. Rourk: esophageal plaques with +KOH, hh. Gastric antrum abnormal, bx reactive gastropathy. Anal canal hemorrhoids, colonic tics, normal TI, single polyp (sessile serrated   tubular adenoma). Next TCS 12/2017  . IR FLUORO GUIDE CV LINE RIGHT  01/10/2019  . IR US GUIDE VASC ACCESS RIGHT  01/10/2019  . LEFT HEART CATH AND CORONARY ANGIOGRAPHY N/A 05/20/2017   Procedure: LEFT HEART CATH AND CORONARY ANGIOGRAPHY;  Surgeon: Martinique, Peter M, MD;  Location: [facility];  Service: Cardiovascular;  Laterality: N/A;  . 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 TEE WITHOUT CARDIOVERSION N/A 01/03/2016   Procedure: TRANSESOPHAGEAL ECHOCARDIOGRAM (TEE) WITH PROPOFOL;  Surgeon: Satira Sark, MD;  Location: [facility];  Service: Cardiovascular;  Laterality: N/A;     Family History  Problem Relation Age of Onset  . Cervical cancer Mother   . Lung cancer Father   . Colon polyps Father        ???  . Pneumonia Brother   . Colon cancer Paternal Grandmother 88  . AAA (abdominal aortic aneurysm) Paternal Grandmother   . Breast cancer Paternal Grandmother   . Colon cancer Paternal Grandfather 68  . Barrett's esophagus Paternal Aunt   . Stomach cancer Neg Hx   . Pancreatic cancer Neg Hx     Social History:  reports that she quit smoking about 11 years ago. Her smoking use included cigarettes. She quit after 0.00 years of use. Her smokeless tobacco use includes snuff. She reports that she does not drink alcohol and does not use drugs.  Allergies:  Allergies  Allergen Reactions  . Ciprofloxacin Nausea And Vomiting  . Ceftriaxone Rash  . Dilaudid [Hydromorphone Hcl] Anxiety and Other (See Comments)    Causes hallucinations, anxiety, and panic per patient  . [place]et Venom Swelling    Swelling at injection site  . Doxycycline   . Adhesive [Tape] Rash    Please use paper tape  . Brintellix [Vortioxetine] Itching  . Cymbalta [Duloxetine Hcl] Itching  . Penicillins Itching    Did it involve swelling of the face/tongue/throat, SOB, or low BP? No Did it involve sudden or severe rash/hives, skin peeling, or any reaction on the inside of your mouth or nose? No Did you need to seek medical attention at a hospital or doctor's office? No When did it last happen?54 yrs old If all above answers are "NO", may proceed with cephalosporin use.    Medications: I have reviewed the patient's current medications.   Results for orders placed or performed during the hospital encounter of 03/23/20 (from the past 48 hour(s))  Basic metabolic panel     Status: Abnormal   Collection Time: 03/23/20 10:51 AM  Result Value Ref Range   Sodium 134 (L) 135 - 145 mmol/L   Potassium 3.7 3.5 - 5.1 mmol/L   Chloride 99 98 - 111 mmol/L   CO2 19 (L) 22 - 32  mmol/L   Glucose, Bld 123 (H) 70 - 99 mg/dL    Comment: Glucose reference range applies only to samples taken after fasting for at least 8 hours.   BUN 62 (H) 6 - 20 mg/dL   Creatinine, Ser 5.49 (H) 0.44 - 1.00 mg/dL   Calcium 8.6 (L) 8.9 - 10.3 mg/dL   GFR calc non Af Amer 8 (L) >60 mL/min   GFR calc Af Amer 9 (L) >60 mL/min   Anion gap 16 (H) 5 - 15    Comment: Performed at [facility], [address], [place], [place] [address]  Brain natriuretic peptide     Status: Abnormal   Collection Time: 03/23/20 10:51 AM  Result Value Ref Range  B Natriuretic Peptide 2,843.0 (H) 0.0 - 100.0 pg/mL    Comment: Performed at [facility], [address], [place], [place] [address]  CBC with Differential     Status: Abnormal   Collection Time: 03/23/20 10:51 AM  Result Value Ref Range   WBC 6.9 4.0 - 10.5 K/uL   RBC 3.88 3.87 - 5.11 MIL/uL   Hemoglobin 11.2 (L) 12.0 - 15.0 g/dL   HCT 35.8 (L) 36 - 46 %   MCV 92.3 80.0 - 100.0 fL   MCH 28.9 26.0 - 34.0 pg   MCHC 31.3 30.0 - 36.0 g/dL   RDW 15.6 (H) 11.5 - 15.5 %   Platelets 174 150 - 400 K/uL   nRBC 0.0 0.0 - 0.2 %   Neutrophils Relative % 92 %   Neutro Abs 6.3 1.7 - 7.7 K/uL   Lymphocytes Relative 6 %   Lymphs Abs 0.4 (L) 0.7 - 4.0 K/uL   Monocytes Relative 2 %   Monocytes Absolute 0.2 0 - 1 K/uL   Eosinophils Relative 0 %   Eosinophils Absolute 0.0 0 - 0 K/uL   Basophils Relative 0 %   Basophils Absolute 0.0 0 - 0 K/uL   Immature Granulocytes 0 %   Abs Immature Granulocytes 0.02 0.00 - 0.07 K/uL    Comment: Performed at [facility], [address] [place], [place] [address]  SARS Coronavirus 2 by RT PCR (hospital order, performed in [place] hospital lab) Nasopharyngeal Nasopharyngeal Swab     Status: None   Collection Time: 03/23/20 12:33 PM   Specimen: Nasopharyngeal Swab  Result Value Ref Range   SARS Coronavirus 2 NEGATIVE NEGATIVE    Comment: (NOTE) SARS-CoV-2 target nucleic acids are NOT DETECTED.  The SARS-CoV-2 RNA  is generally detectable in upper and lower respiratory specimens during the acute phase of infection. The lowest concentration of SARS-CoV-2 viral copies this assay can detect is 250 copies / mL. A negative result does not preclude SARS-CoV-2 infection and should not be used as the sole basis for treatment or other patient management decisions.  A negative result may occur with improper specimen collection / handling, submission of specimen other than nasopharyngeal swab, presence of viral mutation(s) within the areas targeted by this assay, and inadequate number of viral copies (<250 copies / mL). A negative result must be combined with clinical observations, patient history, and epidemiological information.  Fact Sheet for Patients:   StrictlyIdeas.no  Fact Sheet for Healthcare Providers: BankingDealers.co.za  This test is not yet approved or  cleared by the [place] FDA and has been authorized for detection and/or diagnosis of SARS-CoV-2 by FDA under an Emergency Use Authorization (EUA).  This EUA will remain in effect (meaning this test can be used) for the duration of the COVID-19 declaration under Section 564(b)(1) of the Act, 21 U.S.C. section 360bbb-3(b)(1), unless the authorization is terminated or revoked sooner.  Performed at [facility], [address], [place], [place] [address]   CBC     Status: Abnormal   Collection Time: 03/23/20  6:28 PM  Result Value Ref Range   WBC 6.4 4.0 - 10.5 K/uL   RBC 3.83 (L) 3.87 - 5.11 MIL/uL   Hemoglobin 11.3 (L) 12.0 - 15.0 g/dL   HCT 35.5 (L) 36 - 46 %   MCV 92.7 80.0 - 100.0 fL   MCH 29.5 26.0 - 34.0 pg   MCHC 31.8 30.0 - 36.0 g/dL   RDW 15.7 (H) 11.5 - 15.5 %  Platelets 162 150 - 400 K/uL   nRBC 0.00 0.0 - 0.2 %    Comment: Performed at [facility], [address] [place], [place] [address]  Hemoglobin A1c     Status: None   Collection Time: 03/23/20  6:28 PM  Result  Value Ref Range   Hgb A1c MFr Bld 5.5 4.8 - 5.6 %    Comment: (NOTE) Pre diabetes:          5.7%-6.4%  Diabetes:              >6.4%  Glycemic control for   <7.0% adults with diabetes    Mean Plasma Glucose 111.15 mg/dL    Comment: Performed at [facility] [address] [place], [place] [address]  Glucose, capillary     Status: None   Collection Time: 03/23/20  9:31 PM  Result Value Ref Range   Glucose-Capillary 99 70 - 99 mg/dL    Comment: Glucose reference range applies only to samples taken after fasting for at least 8 hours.  Glucose, capillary     Status: None   Collection Time: 03/24/20  4:08 AM  Result Value Ref Range   Glucose-Capillary 84 70 - 99 mg/dL    Comment: Glucose reference range applies only to samples taken after fasting for at least 8 hours.  Basic metabolic panel     Status: Abnormal   Collection Time: 03/24/20  4:54 AM  Result Value Ref Range   Sodium 134 (L) 135 - 145 mmol/L   Potassium 3.5 3.5 - 5.1 mmol/L   Chloride 99 98 - 111 mmol/L   CO2 18 (L) 22 - 32 mmol/L   Glucose, Bld 92 70 - 99 mg/dL    Comment: Glucose reference range applies only to samples taken after fasting for at least 8 hours.   BUN 68 (H) 6 - 20 mg/dL   Creatinine, Ser 5.79 (H) 0.44 - 1.00 mg/dL   Calcium 8.1 (L) 8.9 - 10.3 mg/dL   GFR calc non Af Amer 8 (L) >60 mL/min   GFR calc Af Amer 9 (L) >60 mL/min   Anion gap 17 (H) 5 - 15    Comment: Performed at [facility], [address], [place], [place] [address]  Glucose, capillary     Status: None   Collection Time: 03/24/20  7:45 AM  Result Value Ref Range   Glucose-Capillary 81 70 - 99 mg/dL    Comment: Glucose reference range applies only to samples taken after fasting for at least 8 hours.   Comment 1 Notify RN    Comment 2 Document in Chart   Glucose, capillary     Status: None   Collection Time: 03/24/20 11:13 AM  Result Value Ref Range   Glucose-Capillary 94 70 - 99 mg/dL    Comment: Glucose reference range  applies only to samples taken after fasting for at least 8 hours.   Comment 1 Notify RN    Comment 2 Document in Chart    *Note: Due to a large number of results and/or encounters for the requested time period, some results have not been displayed. A complete set of results can be found in Results Review.    DG Chest Portable 1 View  Result Date: 03/23/2020 CLINICAL DATA:  54 year old with shortness of breath. End-stage renal disease. EXAM: PORTABLE CHEST 1 VIEW COMPARISON:  03/08/2020 FINDINGS: Stable appearance of the left jugular dialysis catheter with the tip in the right atrium. Persistent densities at the  left costophrenic angle likely represent a small left pleural effusion. Some hazy and patchy densities in the lateral right lung base have slightly progressed. Slightly increased interstitial lung densities are suggestive for mild edema. Heart size is within normal limits and stable. Prominent azygos vein shadow. Negative for pneumothorax. IMPRESSION: 1. Evidence for mild interstitial pulmonary edema. 2. Persistent basilar densities probably represent small pleural effusions particularly on the left side. Hazy and indistinct densities near the right costophrenic angle are indeterminate and could represent a small focus of airspace disease or atelectasis. Electronically Signed   By: Markus Daft M.D.   On: 03/23/2020 09:57    ROS: SOB, cough , orthopnea Blood pressure (!) 126/94, pulse 89, temperature (!) 97.5 F (36.4 C), temperature source Oral, resp. rate 20, height 5' 5" (1.651 m), weight 87 kg, last menstrual period 08/13/2011, SpO2 94 %. General appearance: alert, appears older than stated age and no distress Resp: diminished breath sounds bibasilar Cardio: regular rate and rhythm, S1, S2 normal, no murmur, click, rub or gallop GI: soft, non-tender; bowel sounds normal; no masses,  no organomegaly Extremities: extremities normal, atraumatic, no cyanosis or edema and really minimal  peripheral edema left AVF- positive thrill and bruit-  hand warm- also left sided [facility]  Assessment/Plan: 54 year old WF on dialysis 2 days a week but 2 hosps in last 6 weeks for what appears to be volume overload vs PNA 1 hypoxia-  With normal WBC and no fever favor it being volume as opposed to infection.  Dialysis ordered today with goal of 3-4 liters to be removed-  I suspect her EDW needs to be lowered and maybe her BP meds titrated down   2 ESRD: normally Tuesday and Sat only.  Unclear if this is enough HD to sustain, however, probably just lowering her EDW is a start.  Also not using AVF-  Sounds like needs a fistulogram-  I am happy to arrange thru [facility]-  She agrees to that 3 Hypertension/vol: as above-  Seems volume overloaded-  Will stop hydralazine and see if can challenge and lower EDW 4. Anemia of ESRD: hgb over 11- no meds indicated at this time 5. Metabolic Bone Disease: phoslo on med list, will add on    Louis Meckel 03/24/2020, 12:13 PM

## 2020-03-24 NOTE — Progress Notes (Addendum)
PROGRESS NOTE   Summer Cook  BPZ:[id number] DOB: November 06, 1965 DOA: 03/23/2020 PCP: Redmond School, MD   Chief Complaint  Patient presents with   Shortness of Breath    Brief Admission History:  54 y.o. female smokeless tobacco user with medical history significant of ESRD on hemodialysis with treatments given on Tuesdays and Saturdays only due to issues with her blood pressure she no longer receives Thursday treatments.  She is however supposed to be taking a lasix 80 mg tablet twice daily on nondialysis days because she still makes urine but she says she stopped taking it because she felt it wasn't effective at causing diuresis and she "urinated more when she was not taking it."  Two weeks ago she was treated with a course of antibiotics for pneumonia and I reviewed records from [facility] where she was seen in ED for acute systolic CHF and volume overload and she was discharged to go straight to hemodialysis for treatments.  She says she started to feel better after completing the last day of the antibiotics.  Unfortunately symptoms shortly began to resurface.  Assessment & Plan:   Principal Problem:   Acute exacerbation of CHF (congestive heart failure) (HCC) Active Problems:   Essential hypertension   Immunosuppressed status (HCC)   Acute respiratory failure with hypoxia (HCC)   History of pulmonary embolism   Orthostatic hypotension   Asthma, chronic   Anxiety state   History of stroke   CAD (coronary artery disease): total distal RCA EF normal (05/20/17)   Unstable angina (HCC)   Acute on chronic systolic congestive heart failure (HCC)   ESRD (end stage renal disease) (HCC)   Acute pulmonary edema (HCC)   SOB (shortness of breath)   Volume overload   Orthopnea   Smokeless tobacco use  1. Acute combined systolic / diastolic heart failure - Pt was volume overloaded and will be treated with hemodialysis.  Appreciate assistance of nephrology team.  IV lasix 80 mg ordered by ED  physician.  Pt had a 2D echo in Oct 2020 with findings of EF 40% and grade 1 diastolic dysfunction.   2. Acute respiratory failure with hypoxia - Sars 2 coronovirus test was negative and patient has been vaccinated. She continues to have SOB.  I suspect she has COPD from chewing tobacco use. She is feeling better with neb treatments. Continue neb treatments today.  Hopefully will feel better to go home tomorrow.  Treat volume overload with hemodialysis.  Pt is convinced she has pneumonia.  I told her that I couldn't find any evidence of pneumonia on today's CXR or the CXR from 2 weeks ago.  I told her after diuresis I would order a repeat CXR to be sure that there is no hidden pneumonia seen that was masked by the fluid.  Follow up on repeat CXR after hemodialysis.   3. Acute pulmonary edema - managed by hemodialysis and supportive care, supplemental oxygen.  4. ESRD on HD - Pt is on an atypical schedule that is mostly Tues and Saturdays. Nephrology consult appreciated.  HD orders per nephrology.  5. CAD - stable, asked for home meds to be reconciled so that they can be resumed.  6. History of CVA - stable.  7. Essential hypertension - resume home meds when reconciled.   8. Tobacco - Pt counseled that she needs to stop using tobacco including chewing tobacco as this is likely affecting her lungs negatively.  Pt verbalized understanding.   DVT prophylaxis: subcut heparin  Code Status: Full   Family Communication: daughter   Disposition Plan: home   Consults called: nephrology    Status is: Inpatient  Remains inpatient appropriate because:Inpatient level of care appropriate due to severity of illness  Dispo: The patient is from: Home              Anticipated d/c is to: Home              Anticipated d/c date is: 1 day              Patient currently is not medically stable to d/c.  Consultants:   Nephrology   Procedures:   Hemodialysis 6/27   Antimicrobials:    Subjective: Pt still  coughing and having chest congestion.  Neb treatments are helping.   Objective: Vitals:   03/24/20 1515 03/24/20 1530 03/24/20 1545 03/24/20 1600  BP: 126/79 128/82 134/84 124/75  Pulse: 76 76 77 79  Resp:      Temp:      TempSrc:      SpO2:      Weight:      Height:        Intake/Output Summary (Last 24 hours) at 03/24/2020 1614 Last data filed at 03/23/2020 2134 Gross per 24 hour  Intake 150 ml  Output --  Net 150 ml   Filed Weights   03/23/20 1800 03/24/20 0500 03/24/20 1250  Weight: 87 kg 87 kg 88.3 kg    Examination:  General exam: chronically ill appearing female.  Appears calm and comfortable  Respiratory system: crackles heard posteriorly. Respiratory effort normal. Cardiovascular system: S1 & S2 heard, RRR. No JVD, murmurs, rubs, gallops or clicks. No pedal edema. Gastrointestinal system: Abdomen is nondistended, soft and nontender. No organomegaly or masses felt. Normal bowel sounds heard. Central nervous system: Alert and oriented. No focal neurological deficits. Extremities: Symmetric 5 x 5 power. Skin: No rashes, lesions or ulcers Psychiatry: Judgement and insight appear normal. Mood & affect appropriate.   Data Reviewed: I have personally reviewed following labs and imaging studies  CBC: Recent Labs  Lab 03/23/20 1051 03/23/20 1828  WBC 6.9 6.4  NEUTROABS 6.3  --   HGB 11.2* 11.3*  HCT 35.8* 35.5*  MCV 92.3 92.7  PLT 174 001    Basic Metabolic Panel: Recent Labs  Lab 03/23/20 1051 03/24/20 0454  NA 134* 134*  K 3.7 3.5  CL 99 99  CO2 19* 18*  GLUCOSE 123* 92  BUN 62* 68*  CREATININE 5.49* 5.79*  CALCIUM 8.6* 8.1*    GFR: Estimated Creatinine Clearance: 12.2 mL/min (A) (by C-G formula based on SCr of 5.79 mg/dL (H)).  Liver Function Tests: No results for input(s): AST, ALT, ALKPHOS, BILITOT, PROT, ALBUMIN in the last 168 hours.  CBG: Recent Labs  Lab 03/23/20 2131 03/24/20 0408 03/24/20 0745 03/24/20 1113  GLUCAP 99 84 81 94     Recent Results (from the past 240 hour(s))  SARS Coronavirus 2 by RT PCR (hospital order, performed in [facility] hospital lab) Nasopharyngeal Nasopharyngeal Swab     Status: None   Collection Time: 03/23/20 12:33 PM   Specimen: Nasopharyngeal Swab  Result Value Ref Range Status   SARS Coronavirus 2 NEGATIVE NEGATIVE Final    Comment: (NOTE) SARS-CoV-2 target nucleic acids are NOT DETECTED.  The SARS-CoV-2 RNA is generally detectable in upper and lower respiratory specimens during the acute phase of infection. The lowest concentration of SARS-CoV-2 viral copies this assay can detect is 250 copies /  mL. A negative result does not preclude SARS-CoV-2 infection and should not be used as the sole basis for treatment or other patient management decisions.  A negative result may occur with improper specimen collection / handling, submission of specimen other than nasopharyngeal swab, presence of viral mutation(s) within the areas targeted by this assay, and inadequate number of viral copies (<250 copies / mL). A negative result must be combined with clinical observations, patient history, and epidemiological information.  Fact Sheet for Patients:   StrictlyIdeas.no  Fact Sheet for Healthcare Providers: BankingDealers.co.za  This test is not yet approved or  cleared by the [place] FDA and has been authorized for detection and/or diagnosis of SARS-CoV-2 by FDA under an Emergency Use Authorization (EUA).  This EUA will remain in effect (meaning this test can be used) for the duration of the COVID-19 declaration under Section 564(b)(1) of the Act, 21 U.S.C. section 360bbb-3(b)(1), unless the authorization is terminated or revoked sooner.  Performed at [facility], [address], [place], [place] [address]      Radiology Studies: DG Chest Portable 1 View  Result Date: 03/23/2020 CLINICAL DATA:  54 year old with shortness of  breath. End-stage renal disease. EXAM: PORTABLE CHEST 1 VIEW COMPARISON:  03/08/2020 FINDINGS: Stable appearance of the left jugular dialysis catheter with the tip in the right atrium. Persistent densities at the left costophrenic angle likely represent a small left pleural effusion. Some hazy and patchy densities in the lateral right lung base have slightly progressed. Slightly increased interstitial lung densities are suggestive for mild edema. Heart size is within normal limits and stable. Prominent azygos vein shadow. Negative for pneumothorax. IMPRESSION: 1. Evidence for mild interstitial pulmonary edema. 2. Persistent basilar densities probably represent small pleural effusions particularly on the left side. Hazy and indistinct densities near the right costophrenic angle are indeterminate and could represent a small focus of airspace disease or atelectasis. Electronically Signed   By: Markus Daft M.D.   On: 03/23/2020 09:57   Scheduled Meds:  calcium acetate  667 mg Oral TID WC   Chlorhexidine Gluconate Cloth  6 each Topical Q0600   heparin  5,000 Units Subcutaneous Q8H   heparin sodium (porcine)  3,800 Units Intracatheter Once   insulin aspart  0-6 Units Subcutaneous TID WC   ipratropium-albuterol  3 mL Nebulization TID   isosorbide dinitrate  10 mg Oral BID   metoprolol succinate  50 mg Oral Daily   sertraline  100 mg Oral Daily   sodium chloride flush  3 mL Intravenous Q12H   Continuous Infusions:  sodium chloride     sodium chloride     sodium chloride      LOS: 0 days   Time spent: 17 mins   Naturi Alarid Wynetta Emery, MD How to contact the [facility] Attending or Consulting provider [place] or covering provider during after hours [place], for this patient?  1. Check the care team in [facility] and look for a) attending/consulting [facility] provider listed and b) the [facility] team listed 2. Log into [email or web address] and use [place]'s universal password to access. If you do not have the password, please  contact the hospital operator. 3. Locate the [facility] provider you are looking for under [facility] and page to a number that you can be directly reached. 4. If you still have difficulty reaching the provider, please page the [facility] (Director on Call) for the Hospitalists listed on amion for assistance.  03/24/2020, 4:14 PM

## 2020-03-24 NOTE — Procedures (Signed)
     HEMODIALYSIS TREATMENT NOTE:   Unable to modify HD orders which were "completed" and filed under ORDER HISTORY tab in [facility], however the following changes were made based on d/w Dr. Moshe Cipro:   [facility] TDC used, Qb 350-400 as tolerated, 3K/2.5Ca (2.25Ca unavailable) dialysate.  4 hour uneventful treatment completed. Exit site unremarkable. Goal met: 3.6 liters removed without interruption in UF.  All blood was returned.  Rockwell Alexandria, RN

## 2020-03-24 NOTE — Procedures (Signed)
Patient was seen on dialysis and the procedure was supervised.  BFR 350  Via TDC BP is  126/94.   Patient appears to be tolerating treatment well  Louis Meckel 03/24/2020

## 2020-03-25 ENCOUNTER — Inpatient Hospital Stay (HOSPITAL_COMMUNITY): Payer: Medicare Other

## 2020-03-25 DIAGNOSIS — J189 Pneumonia, unspecified organism: Secondary | ICD-10-CM

## 2020-03-25 LAB — GLUCOSE, CAPILLARY
Glucose-Capillary: 85 mg/dL (ref 70–99)
Glucose-Capillary: 73 mg/dL (ref 70–99)
Glucose-Capillary: 96 mg/dL (ref 70–99)

## 2020-03-25 LAB — RENAL FUNCTION PANEL
Albumin: 3 g/dL — ABNORMAL LOW (ref 3.5–5.0)
Anion gap: 12 (ref 5–15)
BUN: 38 mg/dL — ABNORMAL HIGH (ref 6–20)
CO2: 26 mmol/L (ref 22–32)
Calcium: 7.8 mg/dL — ABNORMAL LOW (ref 8.9–10.3)
Chloride: 94 mmol/L — ABNORMAL LOW (ref 98–111)
Creatinine, Ser: 4.09 mg/dL — ABNORMAL HIGH (ref 0.44–1.00)
GFR calc Af Amer: 13 mL/min — ABNORMAL LOW (ref >60)
GFR calc non Af Amer: 12 mL/min — ABNORMAL LOW (ref >60)
Glucose, Bld: 93 mg/dL (ref 70–99)
Phosphorus: 4.5 mg/dL (ref 2.5–4.6)
Potassium: 3.5 mmol/L (ref 3.5–5.1)
Sodium: 132 mmol/L — ABNORMAL LOW (ref 135–145)

## 2020-03-25 LAB — HEPATITIS B SURFACE ANTIGEN: Hepatitis B Surface Ag: NONREACTIVE

## 2020-03-25 MED ORDER — LEVOFLOXACIN IN D5W 500 MG/100ML IV SOLN: 500.0000 mg | INTRAVENOUS | Status: DC

## 2020-03-25 MED ORDER — PIPERACILLIN-TAZOBACTAM 3.375 G IVPB
INTRAVENOUS | Status: AC
  Filled 2020-03-25: qty 50

## 2020-03-25 MED ORDER — LEVOFLOXACIN 750 MG PO TABS
750.0000 mg | ORAL_TABLET | Freq: Once | ORAL | Status: AC
  Administered 2020-03-25: 750 mg via ORAL
  Filled 2020-03-25: qty 1

## 2020-03-25 MED ORDER — LEVOFLOXACIN IN D5W 750 MG/150ML IV SOLN
750.0000 mg | Freq: Once | INTRAVENOUS | Status: AC
  Administered 2020-03-25: 750 mg via INTRAVENOUS
  Filled 2020-03-25: qty 150

## 2020-03-25 MED ORDER — LEVOFLOXACIN 500 MG PO TABS
500.0000 mg | ORAL_TABLET | ORAL | 0 refills | Status: AC
Start: 2020-03-27 — End: 2020-03-31

## 2020-03-25 NOTE — Discharge Summary (Signed)
Physician Discharge Summary  Summer Cook PQD:[id number] DOB: 07/07/1966 DOA: 03/23/2020  PCP: Redmond School, MD  Admit date: 03/23/2020 Discharge date: 03/25/2020  Admitted From:  Home  Disposition:  Home   Recommendations for Outpatient Follow-up:  1. Go to dialysis tomorrow per your regular schedule   Discharge Condition: STABLE   CODE STATUS: FULL    Brief Hospitalization Summary: Please see all hospital notes, images, labs for full details of the hospitalization. ADMISSION HPI: Summer Cook is a 54 y.o. female smokeless tobacco user with medical history significant of ESRD on hemodialysis with treatments given on Tuesdays and Saturdays only due to issues with her blood pressure she no longer receives Thursday treatments.  She is however supposed to be taking a lasix 80 mg tablet twice daily on nondialysis days because she still makes urine but she says she stopped taking it because she felt it wasn't effective at causing diuresis and she "urinated more when she was not taking it."  Two weeks ago she was treated with a course of antibiotics for pneumonia and I reviewed records from [facility] where she was seen in ED for acute systolic CHF and volume overload and she was discharged to go straight to hemodialysis for treatments.  She says she started to feel better after completing the last day of the antibiotics.  Unfortunately symptoms shortly began to resurface.  She says that for the past several days she has been unable to lie flat due to severe shortness of breath. She has been wheezing.  She reports that her pulse ox at home was in the 80s when lying recumbent and she was not able to sleep due to wheezing, cough, chest congestion, shortness of breath.  She was supposed to go to hemodialysis today but decided to come to the ER instead.  Her cough has been mostly nonproductive.  She denies fever and chills.  She denies nausea, vomiting and diarrhea.  She is more worried that she has  pneumonia and does not believe her problem is due to the fluid overload because it "doesn't feel the same."  She wants a "CT scan."  She admits that she has had periods where she has missed dialysis for a week at a time but this current shortness of breath feels different.  It started after her 2nd Covid 19 vaccine on June 2.  She denies chest pain and palpitations.   ED Course: Pt presented with HR of 103, BP 155/112, pulse ox 95% on room air.  She coughed with colorless sputum seen in small amounts.  BNP 2843, Na 134, K 3.7, Glucose 123, creatinine 5.49, Hg 11.2, chest xray with findings of pulmonary edema.  IV lasix 80 mg x 1 dose ordered in ED. Sars 2 Coronavirus test pending.   Nephrology was consulted for hemodialysis orders. Pt will be admitted for observation for further management.   1. Acute combined systolic / diastolic heart failure - Pt was volume overloaded and will be treated with hemodialysis. Appreciate assistance of nephrology team. IV lasix 80 mg ordered by ED physician. Pt had a 2D echo in Oct 2020 with findings of EF 40% and grade 1 diastolic dysfunction.  2. Acute respiratory failure with hypoxia - Sars 2 coronovirus test was negative and patient has been vaccinated. She continues to have SOB.  I suspect she has COPD from chewing tobacco use. She is feeling better with neb treatments.  3. CAP - CXR x 2 did confirm Right pneumonia, She was started on  levofloxacin due to multiple listed drug allergies/intolerances.  4. Acute pulmonary edema - managed with hemodialysis.   5. ESRD on HD - Pt is on an atypical schedule that is mostly Tues and Saturdays. Nephrology consult appreciated.  They think she would benefit from three times weekly HD but at this time patient is not agreeable.  She will discuss with her private nephrologist.  6. CAD - stable resume home meds.   7. History of CVA - stable.  8. Essential hypertension - resume home meds when reconciled. 9. Tobacco - Pt  counseled that she needs to stop using tobacco including chewing tobacco as this is likely affecting her lungs negatively.  Pt verbalized understanding.   DVT prophylaxis:subcut heparin Code Status:Full Family Communication:daughter Disposition Plan:home Consults called:nephrology   Discharge Diagnoses:  Principal Problem:   Acute exacerbation of CHF (congestive heart failure) ([place]) Active Problems:   Essential hypertension   Immunosuppressed status ([place])   CAP (community acquired pneumonia)   Acute respiratory failure with hypoxia ([place])   History of pulmonary embolism   Orthostatic hypotension   Asthma, chronic   Anxiety state   History of stroke   CAD (coronary artery disease): total distal RCA EF normal (05/20/17)   Unstable angina (HCC)   Acute on chronic systolic congestive heart failure (HCC)   ESRD (end stage renal disease) ([place])   Acute pulmonary edema (HCC)   SOB (shortness of breath)   Volume overload   Orthopnea   Smokeless tobacco use   Discharge Instructions:  Allergies as of 03/25/2020      Reactions   Ciprofloxacin Nausea And Vomiting   Ceftriaxone Rash   Dilaudid [hydromorphone Hcl] Anxiety, Other (See Comments)   Causes hallucinations, anxiety, and panic per patient   Yellow Jacket Venom Swelling   Swelling at injection site   Doxycycline    Adhesive [tape] Rash   Please use paper tape   Brintellix [vortioxetine] Itching   Cymbalta [duloxetine Hcl] Itching   Penicillins Itching   Did it involve swelling of the face/tongue/throat, SOB, or low BP? No Did it involve sudden or severe rash/hives, skin peeling, or any reaction on the inside of your mouth or nose? No Did you need to seek medical attention at a hospital or doctor's office? No When did it last happen?54 yrs old If all above answers are "NO", may proceed with cephalosporin use.      Medication List    STOP taking these medications   diclofenac sodium 1 %  Gel Commonly known as: VOLTAREN     TAKE these medications   acetaminophen 500 MG tablet Commonly known as: TYLENOL Take 1,000 mg by mouth every 6 (six) hours as needed for moderate pain.   albuterol (2.5 MG/3ML) 0.083% nebulizer solution Commonly known as: PROVENTIL Take 3 mLs (2.5 mg total) by nebulization every 4 (four) hours as needed for shortness of breath.   ProAir HFA 108 (90 Base) MCG/ACT inhaler Generic drug: albuterol Inhale 2 puffs into the lungs every 4 (four) hours as needed for shortness of breath.   calcium acetate 667 MG capsule Commonly known as: PHOSLO Take 2 capsules by mouth See admin instructions. Three times daily with meals and snacks   furosemide 80 MG tablet Commonly known as: LASIX Take 80 mg by mouth See admin instructions. Take 80 mg daily on non dialysis days - Monday, Wednesday, Thursday, Friday, Sunday   hydrALAZINE 50 MG tablet Commonly known as: APRESOLINE Take 0.5 tablets (25 mg total) by mouth 2 (  two) times daily. What changed: how much to take   isosorbide dinitrate 10 MG tablet Commonly known as: ISORDIL Take 1 tablet (10 mg total) by mouth 2 (two) times daily.   levofloxacin 500 MG tablet Commonly known as: Levaquin Take 1 tablet (500 mg total) by mouth every other day for 4 days. Start taking on: March 27, 2020   lidocaine-prilocaine cream Commonly known as: EMLA Apply 1 application topically as needed (numbing).   loratadine 10 MG tablet Commonly known as: CLARITIN Take 10 mg by mouth daily as needed for allergies.   metoprolol succinate 50 MG 24 hr tablet Commonly known as: TOPROL-XL Take 50 mg by mouth daily. Take with or immediately following a meal.   midodrine 5 MG tablet Commonly known as: PROAMATINE Take 5 mg by mouth daily as needed (for  low blood pressure.).   multivitamin Tabs tablet Take 1 tablet by mouth daily.   nitroGLYCERIN 0.4 MG SL tablet Commonly known as: NITROSTAT Place 1 tablet (0.4 mg total)  under the tongue every 5 (five) minutes x 3 doses as needed for chest pain.   Oxycodone HCl 10 MG Tabs Take 1 tablet (10 mg total) by mouth every 6 (six) hours as needed. What changed: reasons to take this   sertraline 100 MG tablet Commonly known as: ZOLOFT Take 100 mg by mouth daily.       Follow-up Information    Redmond School, MD. Schedule an appointment as soon as possible for a visit in 1 week(s).   Specialty: Internal Medicine Contact information: [address] [place] [address] [phone]        Satira Sark, MD .   Specialty: Cardiology Contact information: [place] [address] [phone]              Allergies  Allergen Reactions  . Ciprofloxacin Nausea And Vomiting  . Ceftriaxone Rash  . Dilaudid [Hydromorphone Hcl] Anxiety and Other (See Comments)    Causes hallucinations, anxiety, and panic per patient  . Yellow Jacket Venom Swelling    Swelling at injection site  . Doxycycline   . Adhesive [Tape] Rash    Please use paper tape  . Brintellix [Vortioxetine] Itching  . Cymbalta [Duloxetine Hcl] Itching  . Penicillins Itching    Did it involve swelling of the face/tongue/throat, SOB, or low BP? No Did it involve sudden or severe rash/hives, skin peeling, or any reaction on the inside of your mouth or nose? No Did you need to seek medical attention at a hospital or doctor's office? No When did it last happen?54 yrs old If all above answers are "NO", may proceed with cephalosporin use.   Allergies as of 03/25/2020      Reactions   Ciprofloxacin Nausea And Vomiting   Ceftriaxone Rash   Dilaudid [hydromorphone Hcl] Anxiety, Other (See Comments)   Causes hallucinations, anxiety, and panic per patient   Yellow Jacket Venom Swelling   Swelling at injection site   Doxycycline    Adhesive [tape] Rash   Please use paper tape   Brintellix [vortioxetine] Itching   Cymbalta [duloxetine Hcl] Itching    Penicillins Itching   Did it involve swelling of the face/tongue/throat, SOB, or low BP? No Did it involve sudden or severe rash/hives, skin peeling, or any reaction on the inside of your mouth or nose? No Did you need to seek medical attention at a hospital or doctor's office? No When did it last happen?54 yrs old If all  above answers are "NO", may proceed with cephalosporin use.      Medication List    STOP taking these medications   diclofenac sodium 1 % Gel Commonly known as: VOLTAREN     TAKE these medications   acetaminophen 500 MG tablet Commonly known as: TYLENOL Take 1,000 mg by mouth every 6 (six) hours as needed for moderate pain.   albuterol (2.5 MG/3ML) 0.083% nebulizer solution Commonly known as: PROVENTIL Take 3 mLs (2.5 mg total) by nebulization every 4 (four) hours as needed for shortness of breath.   ProAir HFA 108 (90 Base) MCG/ACT inhaler Generic drug: albuterol Inhale 2 puffs into the lungs every 4 (four) hours as needed for shortness of breath.   calcium acetate 667 MG capsule Commonly known as: PHOSLO Take 2 capsules by mouth See admin instructions. Three times daily with meals and snacks   furosemide 80 MG tablet Commonly known as: LASIX Take 80 mg by mouth See admin instructions. Take 80 mg daily on non dialysis days - Monday, Wednesday, Thursday, Friday, Sunday   hydrALAZINE 50 MG tablet Commonly known as: APRESOLINE Take 0.5 tablets (25 mg total) by mouth 2 (two) times daily. What changed: how much to take   isosorbide dinitrate 10 MG tablet Commonly known as: ISORDIL Take 1 tablet (10 mg total) by mouth 2 (two) times daily.   levofloxacin 500 MG tablet Commonly known as: Levaquin Take 1 tablet (500 mg total) by mouth every other day for 4 days. Start taking on: March 27, 2020   lidocaine-prilocaine cream Commonly known as: EMLA Apply 1 application topically as needed (numbing).   loratadine 10 MG tablet Commonly known as:  CLARITIN Take 10 mg by mouth daily as needed for allergies.   metoprolol succinate 50 MG 24 hr tablet Commonly known as: TOPROL-XL Take 50 mg by mouth daily. Take with or immediately following a meal.   midodrine 5 MG tablet Commonly known as: PROAMATINE Take 5 mg by mouth daily as needed (for  low blood pressure.).   multivitamin Tabs tablet Take 1 tablet by mouth daily.   nitroGLYCERIN 0.4 MG SL tablet Commonly known as: NITROSTAT Place 1 tablet (0.4 mg total) under the tongue every 5 (five) minutes x 3 doses as needed for chest pain.   Oxycodone HCl 10 MG Tabs Take 1 tablet (10 mg total) by mouth every 6 (six) hours as needed. What changed: reasons to take this   sertraline 100 MG tablet Commonly known as: ZOLOFT Take 100 mg by mouth daily.       Procedures/Studies: DG Chest 2 View  Result Date: 03/24/2020 CLINICAL DATA:  Cough, pulmonary edema EXAM: CHEST - 2 VIEW COMPARISON:  03/23/2020 FINDINGS: LEFT jugular catheter with tip projecting over superior RIGHT atrium. Enlargement of cardiac silhouette with slight vascular congestion. Mediastinal contours normal. Bibasilar effusions and atelectasis greater on LEFT. Minimal infiltrate RIGHT upper lobe. No pneumothorax or acute osseous findings. IMPRESSION: Bibasilar effusions and atelectasis greater on LEFT. Mild RIGHT upper lobe infiltrate. Electronically Signed   By: Lavonia Dana M.D.   On: 03/24/2020 17:58   DG CHEST PORT 1 VIEW  Result Date: 03/25/2020 CLINICAL DATA:  Acute pulmonary edema EXAM: PORTABLE CHEST 1 VIEW COMPARISON:  Yesterday FINDINGS: Dialysis catheter on the left with tip at the right atrium. Left more than right pleural effusion, moderate on the left, with underlying pulmonary opacity that could be atelectasis or consolidation. No generalized Kerley lines or pneumothorax. IMPRESSION: Unchanged left more than right pleural effusion  with underlying pulmonary opacity. Electronically Signed   By: Monte Fantasia  M.D.   On: 03/25/2020 06:11   DG Chest Portable 1 View  Result Date: 03/23/2020 CLINICAL DATA:  54 year old with shortness of breath. End-stage renal disease. EXAM: PORTABLE CHEST 1 VIEW COMPARISON:  03/08/2020 FINDINGS: Stable appearance of the left jugular dialysis catheter with the tip in the right atrium. Persistent densities at the left costophrenic angle likely represent a small left pleural effusion. Some hazy and patchy densities in the lateral right lung base have slightly progressed. Slightly increased interstitial lung densities are suggestive for mild edema. Heart size is within normal limits and stable. Prominent azygos vein shadow. Negative for pneumothorax. IMPRESSION: 1. Evidence for mild interstitial pulmonary edema. 2. Persistent basilar densities probably represent small pleural effusions particularly on the left side. Hazy and indistinct densities near the right costophrenic angle are indeterminate and could represent a small focus of airspace disease or atelectasis. Electronically Signed   By: Markus Daft M.D.   On: 03/23/2020 09:57      Subjective: Pt says she tolerated dialysis well, she says she is not agreeable to HD three times weekly, she wants to go home, she has outpatient dialysis tomorrow, she says she will speak with her private nephrologist.   Discharge Exam: Vitals:   03/25/20 0518 03/25/20 0824  BP: 131/90   Pulse: 87   Resp: 16   Temp: 98 F (36.7 C)   SpO2: 99% 97%   Vitals:   03/24/20 2111 03/25/20 0500 03/25/20 0518 03/25/20 0824  BP: (!) 132/91  131/90   Pulse: 97  87   Resp: 16  16   Temp: 98.8 F (37.1 C)  98 F (36.7 C)   TempSrc: Oral  Oral   SpO2: 97%  99% 97%  Weight:  84.9 kg    Height:       General: Pt is chronically ill appearing, alert, awake, not in acute distress Cardiovascular: normal S1/S2 +, no rubs, no gallops Respiratory: rales RLL no wheezing, no rhonchi Abdominal: Soft, NT, ND, bowel sounds + Extremities: no edema, no  cyanosis   The results of significant diagnostics from this hospitalization (including imaging, microbiology, ancillary and laboratory) are listed below for reference.     Microbiology: Recent Results (from the past 240 hour(s))  SARS Coronavirus 2 by RT PCR (hospital order, performed in [facility] hospital lab) Nasopharyngeal Nasopharyngeal Swab     Status: None   Collection Time: 03/23/20 12:33 PM   Specimen: Nasopharyngeal Swab  Result Value Ref Range Status   SARS Coronavirus 2 NEGATIVE NEGATIVE Final    Comment: (NOTE) SARS-CoV-2 target nucleic acids are NOT DETECTED.  The SARS-CoV-2 RNA is generally detectable in upper and lower respiratory specimens during the acute phase of infection. The lowest concentration of SARS-CoV-2 viral copies this assay can detect is 250 copies / mL. A negative result does not preclude SARS-CoV-2 infection and should not be used as the sole basis for treatment or other patient management decisions.  A negative result may occur with improper specimen collection / handling, submission of specimen other than nasopharyngeal swab, presence of viral mutation(s) within the areas targeted by this assay, and inadequate number of viral copies (<250 copies / mL). A negative result must be combined with clinical observations, patient history, and epidemiological information.  Fact Sheet for Patients:   StrictlyIdeas.no  Fact Sheet for Healthcare Providers: BankingDealers.co.za  This test is not yet approved or  cleared by the [place]  FDA and has been authorized for detection and/or diagnosis of SARS-CoV-2 by FDA under an Emergency Use Authorization (EUA).  This EUA will remain in effect (meaning this test can be used) for the duration of the COVID-19 declaration under Section 564(b)(1) of the Act, 21 U.S.C. section 360bbb-3(b)(1), unless the authorization is terminated or revoked sooner.  Performed at  [facility], [address], [place], [place] [address]      Labs: BNP (last 3 results) Recent Labs    02/07/20 0944 03/23/20 1051  BNP 2,062.0* 0,354.6*   Basic Metabolic Panel: Recent Labs  Lab 03/23/20 1051 03/24/20 0454 03/25/20 0459  NA 134* 134* 132*  K 3.7 3.5 3.5  CL 99 99 94*  CO2 19* 18* 26  GLUCOSE 123* 92 93  BUN 62* 68* 38*  CREATININE 5.49* 5.79* 4.09*  CALCIUM 8.6* 8.1* 7.8*  PHOS  --   --  4.5   Liver Function Tests: Recent Labs  Lab 03/25/20 0459  ALBUMIN 3.0*   No results for input(s): LIPASE, AMYLASE in the last 168 hours. No results for input(s): AMMONIA in the last 168 hours. CBC: Recent Labs  Lab 03/23/20 1051 03/23/20 1828  WBC 6.9 6.4  NEUTROABS 6.3  --   HGB 11.2* 11.3*  HCT 35.8* 35.5*  MCV 92.3 92.7  PLT 174 162   Cardiac Enzymes: No results for input(s): CKTOTAL, CKMB, CKMBINDEX, TROPONINI in the last 168 hours. BNP: Invalid input(s): POCBNP CBG: Recent Labs  Lab 03/24/20 1113 03/24/20 1639 03/24/20 2113 03/25/20 0225 03/25/20 0729  GLUCAP 94 74 89 85 73   D-Dimer No results for input(s): DDIMER in the last 72 hours. Hgb A1c Recent Labs    03/23/20 1828  HGBA1C 5.5   Lipid Profile No results for input(s): CHOL, HDL, LDLCALC, TRIG, CHOLHDL, LDLDIRECT in the last 72 hours. Thyroid function studies No results for input(s): TSH, T4TOTAL, T3FREE, THYROIDAB in the last 72 hours.  Invalid input(s): FREET3 Anemia work up No results for input(s): VITAMINB12, FOLATE, FERRITIN, TIBC, IRON, RETICCTPCT in the last 72 hours. Urinalysis    Component Value Date/Time   COLORURINE YELLOW (A) 03/12/2019 1847   APPEARANCEUR CLOUDY (A) 03/12/2019 1847   LABSPEC 1.008 03/12/2019 1847   PHURINE 8.0 03/12/2019 1847   GLUCOSEU NEGATIVE 03/12/2019 1847   HGBUR SMALL (A) 03/12/2019 1847   BILIRUBINUR NEGATIVE 03/12/2019 1847   KETONESUR NEGATIVE 03/12/2019 1847   PROTEINUR 100 (A) 03/12/2019 1847   UROBILINOGEN 0.2 02/17/2015  1325   NITRITE NEGATIVE 03/12/2019 1847   LEUKOCYTESUR LARGE (A) 03/12/2019 1847   Sepsis Labs Invalid input(s): PROCALCITONIN,  WBC,  LACTICIDVEN Microbiology Recent Results (from the past 240 hour(s))  SARS Coronavirus 2 by RT PCR (hospital order, performed in [place] hospital lab) Nasopharyngeal Nasopharyngeal Swab     Status: None   Collection Time: 03/23/20 12:33 PM   Specimen: Nasopharyngeal Swab  Result Value Ref Range Status   SARS Coronavirus 2 NEGATIVE NEGATIVE Final    Comment: (NOTE) SARS-CoV-2 target nucleic acids are NOT DETECTED.  The SARS-CoV-2 RNA is generally detectable in upper and lower respiratory specimens during the acute phase of infection. The lowest concentration of SARS-CoV-2 viral copies this assay can detect is 250 copies / mL. A negative result does not preclude SARS-CoV-2 infection and should not be used as the sole basis for treatment or other patient management decisions.  A negative result may occur with improper specimen collection / handling, submission of specimen other than nasopharyngeal swab, presence of  viral mutation(s) within the areas targeted by this assay, and inadequate number of viral copies (<250 copies / mL). A negative result must be combined with clinical observations, patient history, and epidemiological information.  Fact Sheet for Patients:   StrictlyIdeas.no  Fact Sheet for Healthcare Providers: BankingDealers.co.za  This test is not yet approved or  cleared by the [place] FDA and has been authorized for detection and/or diagnosis of SARS-CoV-2 by FDA under an Emergency Use Authorization (EUA).  This EUA will remain in effect (meaning this test can be used) for the duration of the COVID-19 declaration under Section 564(b)(1) of the Act, 21 U.S.C. section 360bbb-3(b)(1), unless the authorization is terminated or revoked sooner.  Performed at [facility], [address] [place], [place] [address]    Time coordinating discharge:   SIGNED:  Irwin Brakeman, MD  [facility] 03/25/2020, 10:39 AM How to contact the [facility] Attending or Consulting provider [place] or covering provider during after hours [place], for this patient?  1. Check the care team in [facility] and look for a) attending/consulting [facility] provider listed and b) the [facility] team listed 2. Log into [email or web address] and use [place]'s universal password to access. If you do not have the password, please contact the hospital operator. 3. Locate the [facility] provider you are looking for under [facility] and page to a number that you can be directly reached. 4. If you still have difficulty reaching the provider, please page the [facility] (Director on Call) for the Hospitalists listed on amion for assistance.

## 2020-03-25 NOTE — Progress Notes (Signed)
Nsg Discharge Note  Admit Date:  03/23/2020 Discharge date: 03/25/2020   Summer Cook to be D/C'd Home per MD order.  AVS completed.   Patient able to verbalize understanding.  Discharge Medication: Allergies as of 03/25/2020      Reactions   Ciprofloxacin Nausea And Vomiting   Ceftriaxone Rash   Dilaudid [hydromorphone Hcl] Anxiety, Other (See Comments)   Causes hallucinations, anxiety, and panic per patient   Yellow Jacket Venom Swelling   Swelling at injection site   Doxycycline    Adhesive [tape] Rash   Please use paper tape   Brintellix [vortioxetine] Itching   Cymbalta [duloxetine Hcl] Itching   Penicillins Itching   Did it involve swelling of the face/tongue/throat, SOB, or low BP? No Did it involve sudden or severe rash/hives, skin peeling, or any reaction on the inside of your mouth or nose? No Did you need to seek medical attention at a hospital or doctor's office? No When did it last happen?54 yrs old If all above answers are "NO", may proceed with cephalosporin use.      Medication List    STOP taking these medications   diclofenac sodium 1 % Gel Commonly known as: VOLTAREN     TAKE these medications   acetaminophen 500 MG tablet Commonly known as: TYLENOL Take 1,000 mg by mouth every 6 (six) hours as needed for moderate pain.   albuterol (2.5 MG/3ML) 0.083% nebulizer solution Commonly known as: PROVENTIL Take 3 mLs (2.5 mg total) by nebulization every 4 (four) hours as needed for shortness of breath.   ProAir HFA 108 (90 Base) MCG/ACT inhaler Generic drug: albuterol Inhale 2 puffs into the lungs every 4 (four) hours as needed for shortness of breath.   calcium acetate 667 MG capsule Commonly known as: PHOSLO Take 2 capsules by mouth See admin instructions. Three times daily with meals and snacks   furosemide 80 MG tablet Commonly known as: LASIX Take 80 mg by mouth See admin instructions. Take 80 mg daily on non dialysis days - Monday,  Wednesday, Thursday, Friday, Sunday   hydrALAZINE 50 MG tablet Commonly known as: APRESOLINE Take 0.5 tablets (25 mg total) by mouth 2 (two) times daily. What changed: how much to take   isosorbide dinitrate 10 MG tablet Commonly known as: ISORDIL Take 1 tablet (10 mg total) by mouth 2 (two) times daily.   levofloxacin 500 MG tablet Commonly known as: Levaquin Take 1 tablet (500 mg total) by mouth every other day for 4 days. Start taking on: March 27, 2020   lidocaine-prilocaine cream Commonly known as: EMLA Apply 1 application topically as needed (numbing).   loratadine 10 MG tablet Commonly known as: CLARITIN Take 10 mg by mouth daily as needed for allergies.   metoprolol succinate 50 MG 24 hr tablet Commonly known as: TOPROL-XL Take 50 mg by mouth daily. Take with or immediately following a meal.   midodrine 5 MG tablet Commonly known as: PROAMATINE Take 5 mg by mouth daily as needed (for  low blood pressure.).   multivitamin Tabs tablet Take 1 tablet by mouth daily.   nitroGLYCERIN 0.4 MG SL tablet Commonly known as: NITROSTAT Place 1 tablet (0.4 mg total) under the tongue every 5 (five) minutes x 3 doses as needed for chest pain.   Oxycodone HCl 10 MG Tabs Take 1 tablet (10 mg total) by mouth every 6 (six) hours as needed. What changed: reasons to take this   sertraline 100 MG tablet Commonly known as:  ZOLOFT Take 100 mg by mouth daily.       Discharge Assessment: Vitals:   03/25/20 0518 03/25/20 0824  BP: 131/90   Pulse: 87   Resp: 16   Temp: 98 F (36.7 C)   SpO2: 99% 97%   Skin clean, dry and intact without evidence of skin break down, no evidence of skin tears noted. IV catheter discontinued intact. Site without signs and symptoms of complications - no redness or edema noted at insertion site, patient denies c/o pain - only slight tenderness at site.  Dressing with slight pressure applied.  D/c Instructions-Education: Discharge instructions given  to patient with verbalized understanding. D/c education completed with patient including follow up instructions, medication list, d/c activities limitations if indicated, with other d/c instructions as indicated by MD - patient able to verbalize understanding, all questions fully answered. Patient instructed to return to ED, call 911, or call MD for any changes in condition.  Patient escorted via [place], and D/C home via private auto.  Abishai Viegas Loletha Grayer, RN 03/25/2020 11:40 AM

## 2020-03-25 NOTE — Significant Event (Signed)
03/25/2020 10:52 AM  IV infiltrated and did not receive IV levofloxacin.  Changed to oral dose to be given prior to discharge.  Murvin Natal MD

## 2020-03-25 NOTE — Progress Notes (Signed)
Admit: 03/23/2020 LOS: 1  86F ESRD T/S [facility] with SOB/Cough  Subjective:  . HD yesterday, 3.6L UF . SOB improved, pt states still has cough  . 2V CXR at [facility] = b/l effusions/atelectasis and RUL infiltrate . 2L [place] this AM sPO2 99%  06/27 0701 - 06/28 0700 In: 600 [P.O.:600] Out: 3600   Filed Weights   03/24/20 0500 03/24/20 1250 03/25/20 0500  Weight: 87 kg 88.3 kg 84.9 kg    Scheduled Meds: . calcium acetate  667 mg Oral TID WC  . Chlorhexidine Gluconate Cloth  6 each Topical Q0600  . heparin  5,000 Units Subcutaneous Q8H  . insulin aspart  0-6 Units Subcutaneous TID WC  . ipratropium-albuterol  3 mL Nebulization TID  . isosorbide dinitrate  10 mg Oral BID  . metoprolol succinate  50 mg Oral Daily  . sertraline  100 mg Oral Daily  . sodium chloride flush  3 mL Intravenous Q12H   Continuous Infusions: . sodium chloride    . sodium chloride    . sodium chloride     PRN Meds:.sodium chloride, sodium chloride, sodium chloride, acetaminophen, alteplase, fentaNYL (SUBLIMAZE) injection, guaiFENesin-dextromethorphan, heparin, lidocaine (PF), lidocaine-prilocaine, loratadine, midodrine, nitroGLYCERIN, ondansetron (ZOFRAN) IV, oxyCODONE, pentafluoroprop-tetrafluoroeth, sodium chloride flush, traZODone  Current Labs: reviewed    Physical Exam:  Blood pressure 131/90, pulse 87, temperature 98 F (36.7 C), temperature source Oral, resp. rate 16, height 5\' 5"  (1.651 m), weight 84.9 kg, last menstrual period 08/13/2011, SpO2 99 %. NAD Diminished in bases, scattered crackles R>L LUE AVF +B/T LIJ TDC Regular, nl s1s2 No sig LEE No rashes/lesions s/nt  A 1. ESRD, [facility] Tues/Sat 2. SOB/Effusions, hypervolemia 3. Cough 4. Nonfunctional AVF LUE, using TDC 5. Anemia, Hb 11s 6. CKD-BMD: P and  Ca s table, on PhosLo  P . I think she needs 3x/wk HD, discussed with her . Will cont HD here T/H/S, needs EDW down further, continued UF: 3K, 4h, 4L UF, TDC  400/600, tight heparin . Process of [facility] referral for nonfunctional AVF in place . Medication Issues; o Preferred narcotic agents for pain control are hydromorphone, fentanyl, and methadone. Morphine should not be used.  o Baclofen should be avoided o Avoid oral sodium phosphate and magnesium citrate based laxatives / bowel preps    Pearson Grippe MD 03/25/2020, 6:26 AM  Recent Labs  Lab 03/23/20 1051 03/24/20 0454 03/25/20 0459  NA 134* 134* 132*  K 3.7 3.5 3.5  CL 99 99 94*  CO2 19* 18* 26  GLUCOSE 123* 92 93  BUN 62* 68* 38*  CREATININE 5.49* 5.79* 4.09*  CALCIUM 8.6* 8.1* 7.8*  PHOS  --   --  4.5   Recent Labs  Lab 03/23/20 1051 03/23/20 1828  WBC 6.9 6.4  NEUTROABS 6.3  --   HGB 11.2* 11.3*  HCT 35.8* 35.5*  MCV 92.3 92.7  PLT 174 162

## 2020-03-25 NOTE — Progress Notes (Signed)
Pharmacy Antibiotic Note  Summer Cook is a 54 y.o. female admitted on 03/23/2020 with pneumonia.  Pharmacy has been consulted for Levaquin dosing.  Plan: Levaquin 750mg  IV x1 followed by 500mg  IV Q48H.  Height: 5\' 5"  (165.1 cm) Weight: 84.9 kg (187 lb 2.7 oz) IBW/kg (Calculated) : 57  Temp (24hrs), Avg:98.2 F (36.8 C), Min:97.9 F (36.6 C), Max:98.8 F (37.1 C)  Recent Labs  Lab 03/23/20 1051 03/23/20 1828 03/24/20 0454 03/25/20 0459  WBC 6.9 6.4  --   --   CREATININE 5.49*  --  5.79* 4.09*    Estimated Creatinine Clearance: 16.9 mL/min (A) (by C-G formula based on SCr of 4.09 mg/dL (H)).    Allergies  Allergen Reactions  . Ciprofloxacin Nausea And Vomiting  . Ceftriaxone Rash  . Dilaudid [Hydromorphone Hcl] Anxiety and Other (See Comments)    Causes hallucinations, anxiety, and panic per patient  . Yellow Jacket Venom Swelling    Swelling at injection site  . Doxycycline   . Adhesive [Tape] Rash    Please use paper tape  . Brintellix [Vortioxetine] Itching  . Cymbalta [Duloxetine Hcl] Itching  . Penicillins Itching    Did it involve swelling of the face/tongue/throat, SOB, or low BP? No Did it involve sudden or severe rash/hives, skin peeling, or any reaction on the inside of your mouth or nose? No Did you need to seek medical attention at a hospital or doctor's office? No When did it last happen?54 yrs old If all above answers are "NO", may proceed with cephalosporin use.     Thank you for allowing pharmacy to be a part of this patient's care.  Wynona Neat, PharmD, BCPS  03/25/2020 6:49 AM

## 2020-03-26 DIAGNOSIS — N186 End stage renal disease: Secondary | ICD-10-CM | POA: Diagnosis not present

## 2020-03-26 DIAGNOSIS — E46 Unspecified protein-calorie malnutrition: Secondary | ICD-10-CM | POA: Diagnosis not present

## 2020-03-26 DIAGNOSIS — N2581 Secondary hyperparathyroidism of renal origin: Secondary | ICD-10-CM | POA: Diagnosis not present

## 2020-03-26 DIAGNOSIS — Z992 Dependence on renal dialysis: Secondary | ICD-10-CM | POA: Diagnosis not present

## 2020-03-27 DIAGNOSIS — N186 End stage renal disease: Secondary | ICD-10-CM | POA: Diagnosis not present

## 2020-03-27 DIAGNOSIS — Z992 Dependence on renal dialysis: Secondary | ICD-10-CM | POA: Diagnosis not present

## 2020-03-29 ENCOUNTER — Ambulatory Visit: Payer: Medicare Other | Admitting: Urology

## 2020-03-30 DIAGNOSIS — N186 End stage renal disease: Secondary | ICD-10-CM | POA: Diagnosis not present

## 2020-03-30 DIAGNOSIS — Z992 Dependence on renal dialysis: Secondary | ICD-10-CM | POA: Diagnosis not present

## 2020-04-02 DIAGNOSIS — Z992 Dependence on renal dialysis: Secondary | ICD-10-CM | POA: Diagnosis not present

## 2020-04-02 DIAGNOSIS — R071 Chest pain on breathing: Secondary | ICD-10-CM | POA: Diagnosis not present

## 2020-04-02 DIAGNOSIS — I509 Heart failure, unspecified: Secondary | ICD-10-CM | POA: Diagnosis not present

## 2020-04-02 DIAGNOSIS — N186 End stage renal disease: Secondary | ICD-10-CM | POA: Diagnosis not present

## 2020-04-02 DIAGNOSIS — R0603 Acute respiratory distress: Secondary | ICD-10-CM | POA: Diagnosis not present

## 2020-04-02 DIAGNOSIS — N19 Unspecified kidney failure: Secondary | ICD-10-CM | POA: Diagnosis not present

## 2020-04-02 DIAGNOSIS — R091 Pleurisy: Secondary | ICD-10-CM | POA: Diagnosis not present

## 2020-04-02 DIAGNOSIS — J189 Pneumonia, unspecified organism: Secondary | ICD-10-CM | POA: Diagnosis not present

## 2020-04-02 DIAGNOSIS — G894 Chronic pain syndrome: Secondary | ICD-10-CM | POA: Diagnosis not present

## 2020-04-02 DIAGNOSIS — N2581 Secondary hyperparathyroidism of renal origin: Secondary | ICD-10-CM | POA: Diagnosis not present

## 2020-04-06 DIAGNOSIS — N186 End stage renal disease: Secondary | ICD-10-CM | POA: Diagnosis not present

## 2020-04-06 DIAGNOSIS — Z992 Dependence on renal dialysis: Secondary | ICD-10-CM | POA: Diagnosis not present

## 2020-04-09 DIAGNOSIS — N2581 Secondary hyperparathyroidism of renal origin: Secondary | ICD-10-CM | POA: Diagnosis not present

## 2020-04-09 DIAGNOSIS — E119 Type 2 diabetes mellitus without complications: Secondary | ICD-10-CM | POA: Diagnosis not present

## 2020-04-09 DIAGNOSIS — N186 End stage renal disease: Secondary | ICD-10-CM | POA: Diagnosis not present

## 2020-04-09 DIAGNOSIS — D509 Iron deficiency anemia, unspecified: Secondary | ICD-10-CM | POA: Diagnosis not present

## 2020-04-09 DIAGNOSIS — Z992 Dependence on renal dialysis: Secondary | ICD-10-CM | POA: Diagnosis not present

## 2020-04-10 NOTE — Progress Notes (Signed)
Cardiology Office Note    Date:  04/15/2020   ID:  Summer Cook, DOB 03/01/1966, MRN [id number]  PCP:  Redmond School, MD  Cardiologist: Rozann Lesches, MD EPS: None  No chief complaint on file.   History of Present Illness:  Summer Cook is a 54 y.o. female with history of nonischemic cardiomyopathy LVEF 40% on echo 06/2019, CAD with chronically occluded PDA managed medically.  Myoview 2020 without ischemia, hypertension ESRD on HD, trouble with hypotension on dialysis hydralazine may need to be held on those mornings.  LBBB on EKG possibility of resynchronization therapy could be considered although device implantation in the setting of ESRD does not carry a higher risk of infection  Patient was discharge from the hospital 03/25/2020 after admission with heart failure.  She only gets dialysis Tuesdays and Saturdays due to low blood pressure and is supposed to take Lasix 80 mg twice daily on nondialysis days because she still makes urine but she had stopped it because she did not feel it was effective.  She was diuresed and seen by renal.  They wanted her to do dialysis 3 times weekly but patient declined.  Patient comes in for f/u. Overall doing pretty well with fluid status. No shortness of breath or swelling.Frustrated with dialysis and would like a kidney transplant.[facility] is evaluating and daughter is a Orthoptist.They said her heart function will have to be 50%.  Past Medical History:  Diagnosis Date  . Allergy   . Anxiety   . Arthritis   . Asthma   . CAD (coronary artery disease), native coronary artery    a. cath 05/20/17 - 100% distal PDA --> medical mangement (no intervention done)  . Candida esophagitis ([place])   . Chronic combined systolic and diastolic CHF (congestive heart failure) ([place])   . Depression   . End stage renal disease on dialysis [facility])    T/ Th/ Sat starting 4/28- [facility]  . Essential hypertension   . GERD (gastroesophageal reflux disease)   .  Gout   . Headache   . Hemorrhoids   . History of kidney stones    passed  . 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  Last was Prednisone '40mg'$ ; 2 wk in 10/2012.  She also was on Prednisone bridged with Cellcept briefly but stopped due to lack of insurance.   . Myocardial infarction ([place])   . Perinephric hematoma   . Pneumonia   . Pulmonary embolism ([place]) 04/2012  . Serrated adenoma of colon   . Steroid-induced diabetes ([place])    "when taking Prednisone"  . Stroke ([place])   . Thrush, oral 05/29/2011    Past Surgical History:  Procedure Laterality Date  . AV FISTULA PLACEMENT Right 01/23/2019   Procedure: ARTERIOVENOUS (AV) FISTULA CREATION RIGHT ARM;  Surgeon: Elam Dutch, MD;  Location: [place];  Service: Vascular;  Laterality: Right;  . AV FISTULA PLACEMENT Right 01/30/2019   Procedure: GORETEX GRAFT PLACEMENT  RIGHT ARM;  Surgeon: Elam Dutch, MD;  Location: [facility] OR;  Service: Vascular;  Laterality: Right;  . AV FISTULA PLACEMENT Left 08/07/2019   Procedure: LEFT BRACHIOCEPHALIC VENOUS FISTULA;  Surgeon: Elam Dutch, MD;  Location: [place];  Service: Vascular;  Laterality: Left;  . BONE MARROW BIOPSY    . CARDIAC CATHETERIZATION    . CARPAL TUNNEL RELEASE    . CARPAL TUNNEL RELEASE Left 05/20/2016   Procedure: CARPAL  TUNNEL RELEASE;  Surgeon: Carole Civil, MD;  Location: [facility];  Service: Orthopedics;  Laterality: Left;  . CARPAL TUNNEL RELEASE Right 07/16/2016   Procedure: RIGHT CARPAL TUNNEL RELEASE;  Surgeon: Carole Civil, MD;  Location: [facility];  Service: Orthopedics;  Laterality: Right;  . CATARACT EXTRACTION    . CHOLECYSTECTOMY  2008  . COLONOSCOPY WITH ESOPHAGOGASTRODUODENOSCOPY (EGD) N/A 01/04/2013   Dr. Gala Romney: esophageal plaques with +KOH, hh. Gastric antrum abnormal, bx reactive gastropathy. Anal canal hemorrhoids, colonic tics, normal TI, single  polyp (sessile serrated tubular adenoma). Next TCS 12/2017  . IR FLUORO GUIDE CV LINE RIGHT  01/10/2019  . IR US GUIDE VASC ACCESS RIGHT  01/10/2019  . LEFT HEART CATH AND CORONARY ANGIOGRAPHY N/A 05/20/2017   Procedure: LEFT HEART CATH AND CORONARY ANGIOGRAPHY;  Surgeon: Martinique, Peter M, MD;  Location: [place] CV LAB;  Service: Cardiovascular;  Laterality: N/A;  . 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 TEE WITHOUT CARDIOVERSION N/A 01/03/2016   Procedure: TRANSESOPHAGEAL ECHOCARDIOGRAM (TEE) WITH PROPOFOL;  Surgeon: Satira Sark, MD;  [facility];  Service: Cardiovascular;  Laterality: N/A;    Current Medications: Current Meds  Medication Sig  . acetaminophen (TYLENOL) 500 MG tablet Take 1,000 mg by mouth every 6 (six) hours as needed for moderate pain.  Marland Kitchen albuterol (PROVENTIL) (2.5 MG/3ML) 0.083% nebulizer solution Take 3 mLs (2.5 mg total) by nebulization every 4 (four) hours as needed for shortness of breath.  . calcium acetate (PHOSLO) 667 MG capsule Take 2 capsules by mouth See admin instructions. Three times daily with meals and snacks  . furosemide (LASIX) 80 MG tablet Take 80 mg by mouth See admin instructions. Take 80 mg daily on non dialysis days - Monday, Wednesday, Thursday, Friday, Sunday  . hydrALAZINE (APRESOLINE) 50 MG tablet Take 50 mg by mouth 2 (two) times daily.  . isosorbide dinitrate (ISORDIL) 10 MG tablet Take 1 tablet (10 mg total) by mouth 2 (two) times daily.  Marland Kitchen lidocaine-prilocaine (EMLA) cream Apply 1 application topically as needed (numbing).   Marland Kitchen loratadine (CLARITIN) 10 MG tablet Take 10 mg by mouth daily as needed for allergies.  . metoprolol succinate (TOPROL-XL) 50 MG 24 hr tablet Take 50 mg by mouth daily. Take with or immediately following a meal.  . midodrine (PROAMATINE) 5 MG tablet Take 5 mg by mouth daily as needed (for  low blood pressure.).   Marland Kitchen nitroGLYCERIN (NITROSTAT) 0.4 MG SL tablet Place 1 tablet (0.4 mg total) under the tongue every 5  (five) minutes x 3 doses as needed for chest pain.  . Oxycodone HCl 10 MG TABS Take 1 tablet (10 mg total) by mouth every 6 (six) hours as needed.  Marland Kitchen PROAIR HFA 108 (90 Base) MCG/ACT inhaler Inhale 2 puffs into the lungs every 4 (four) hours as needed for shortness of breath.  . sertraline (ZOLOFT) 100 MG tablet Take 100 mg by mouth daily.     Allergies:   Ciprofloxacin, Ceftriaxone, Dilaudid [hydromorphone hcl], Yellow jacket venom, Doxycycline, Adhesive [tape], Brintellix [vortioxetine], Cymbalta [duloxetine hcl], and Penicillins   Social History   Socioeconomic History  . Marital status: Divorced    Spouse name: Not on file  . Number of children: 1  . Years of education: Not on file  . 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   Tobacco Use  . Smoking status: Former Smoker  Years: 0.00    Types: Cigarettes    Quit date: 02/14/2009    Years since quitting: 11.1  . Smokeless tobacco: Current User    Types: Snuff  Vaping Use  . Vaping Use: Never used  Substance and Sexual Activity  . Alcohol use: No    Alcohol/week: 0.0 standard drinks  . Drug use: No  . Sexual activity: Never  Other Topics Concern  . Not on file  Social History Narrative   Lives with her daughter and aunt in a one story home.  Has 1 daughter.  On disability.  Did work as a Optometrist and bus Geophysicist/field seismologist.  Education: some college.    Social Determinants of Health   Financial Resource Strain:   . Difficulty of Paying Living Expenses:   Food Insecurity:   . Worried About Charity fundraiser in the Last Year:   . Arboriculturist in the Last Year:   Transportation Needs:   . Film/video editor (Medical):   Marland Kitchen Lack of Transportation (Non-Medical):   Physical Activity:   . Days of Exercise per Week:   . Minutes of Exercise per Session:   Stress:   . Feeling of Stress :   Social Connections:   . Frequency of  Communication with Friends and Family:   . Frequency of Social Gatherings with Friends and Family:   . Attends Religious Services:   . Active Member of Clubs or Organizations:   . Attends Archivist Meetings:   Marland Kitchen Marital Status:      Family History:  The patient's   family history includes AAA (abdominal aortic aneurysm) in her paternal grandmother; Barrett's esophagus in her paternal aunt; Breast cancer in her paternal grandmother; Cervical cancer in her mother; Colon cancer (age of onset: 109) in her paternal grandfather; Colon cancer (age of onset: 11) in her paternal grandmother; Colon polyps in her father; Lung cancer in her father; Pneumonia in her brother.   ROS:   Please see the history of present illness.    ROS All other systems reviewed and are negative.   PHYSICAL EXAM:   VS:  BP 118/78   Pulse 68   Ht '5\' 5"'$  (1.651 m)   Wt 188 lb (85.3 kg)   LMP 08/13/2011   SpO2 98%   BMI 31.28 kg/m   Physical Exam  GEN: Well nourished, well developed, in no acute distress  Neck: no JVD, carotid bruits, or masses Cardiac:RRR; no murmurs, rubs, or gallops  Respiratory:  clear to auscultation bilaterally, normal work of breathing GI: soft, nontender, nondistended, + BS Ext: without cyanosis, clubbing, or edema, Good distal pulses bilaterally Neuro:  Alert and Oriented x 3 Psych: euthymic mood, full affect  Wt Readings from Last 3 Encounters:  04/15/20 188 lb (85.3 kg)  03/25/20 187 lb 2.7 oz (84.9 kg)  02/08/20 189 lb 6 oz (85.9 kg)      Studies/Labs Reviewed:   EKG:  EKG is not ordered today.    Recent Labs: 02/07/2020: ALT 21; Magnesium 2.6 03/23/2020: B Natriuretic Peptide 2,843.0; Hemoglobin 11.3; Platelets 162 03/25/2020: BUN 38; Creatinine, Ser 4.09; Potassium 3.5; Sodium 132   Lipid Panel    Component Value Date/Time   CHOL 229 (H) 05/19/2017 1912   TRIG 112 01/10/2019 0234   HDL 57 05/19/2017 1912   CHOLHDL 4.0 05/19/2017 1912   VLDL 23 05/19/2017 1912     LDLCALC 149 (H) 05/19/2017 1912    Additional studies/ records that  were reviewed today include:   Lexiscan Myoview 01/12/2019:  Defect 1: There is a small defect present in the apical anterior location.  Findings consistent with prior myocardial infarction.  This is an intermediate risk study.  The left ventricular ejection fraction is moderately decreased (30-44%).   There is a small area of irreversible defect in the apical anterior wall consistent with prior infarct and no ischemia. LVEF 37% with diffuse hypokinesis.    Echocardiogram 07/27/2019:  1. Left ventricular ejection fraction, by visual estimation, is 40%. The left ventricle has moderately decreased function. There is borderline left ventricular hypertrophy.  2. Left ventricular diastolic parameters are consistent with Grade I diastolic dysfunction (impaired relaxation).  3. Diffuse LV hypokinesis.  4. Global right ventricle has low normal systolic function.The right ventricular size is normal. No increase in right ventricular wall thickness.  5. Left atrial size was normal.  6. Right atrial size was normal.  7. The mitral valve is grossly normal. Moderate mitral valve regurgitation.  8. The tricuspid valve is grossly normal. Tricuspid valve regurgitation is trivial.  9. The aortic valve is tricuspid. Aortic valve regurgitation is not visualized. No evidence of aortic valve sclerosis or stenosis. 10. The pulmonic valve was grossly normal. Pulmonic valve regurgitation is not visualized. 11. Normal pulmonary artery systolic pressure. 12. The inferior vena cava is normal in size with greater than 50% respiratory variability, suggesting right atrial pressure of 3 mmHg.      ASSESSMENT:    1. NICM (nonischemic cardiomyopathy) (HCC)   2. Coronary artery disease involving native coronary artery of native heart without angina pectoris   3. Essential hypertension   4. ESRD on dialysis (HCC)   5. LBBB (left bundle branch  block)   6. Chronic combined systolic and diastolic CHF (congestive heart failure) (HCC)      PLAN:  In order of problems listed above:  NICM EF 40% 06/2019 managed with Lasix on nondialysis days because she still makes urine and dialysis twice a week.  Recent hospitalization for acute on chronic CHF because of stopping her Lasix. She is doing well back on lasix. Hopeful for a kidney transplant and [facility] says EF has to be >50%. Will order f/u echo since it's been close to a year.  CAD with chronically occluded PDA managed medically Myoview 2020 without ischemia  Essential hypertension hypotensive after dialysis and has to hold meds dialysis days.  ESRD on HD  LBBB.  Dr. Diona Browner said we should consider resynchronization therapy and possible EP evaluation in the future. He can discuss further at f/u.  Medication Adjustments/Labs and Tests Ordered: Current medicines are reviewed at length with the patient today.  Concerns regarding medicines are outlined above.  Medication changes, Labs and Tests ordered today are listed in the Patient Instructions below. Patient Instructions  Medication Instructions:  Your physician recommends that you continue on your current medications as directed. Please refer to the Current Medication list given to you today.  *If you need a refill on your cardiac medications before your next appointment, please call your pharmacy*   Lab Work: None If you have labs (blood work) drawn today and your tests are completely normal, you will receive your results only by: Marland Kitchen MyChart Message (if you have MyChart) OR . A paper copy in the mail If you have any lab test that is abnormal or we need to change your treatment, we will call you to review the results.   Testing/Procedures: Your physician has requested that you  have an echocardiogram. Echocardiography is a painless test that uses sound waves to create images of your heart. It provides your doctor with information  about the size and shape of your heart and how well your heart's chambers and valves are working. This procedure takes approximately one hour. There are no restrictions for this procedure.     Follow-Up: At [facility], you and your health needs are our priority.  As part of our continuing mission to provide you with exceptional heart care, we have created designated Provider Care Teams.  These Care Teams include your primary Cardiologist (physician) and Advanced Practice Providers (APPs -  Physician Assistants and Nurse Practitioners) who all work together to provide you with the care you need, when you need it.  We recommend signing up for the patient portal called "MyChart".  Sign up information is provided on this After Visit Summary.  MyChart is used to connect with patients for Virtual Visits (Telemedicine).  Patients are able to view lab/test results, encounter notes, upcoming appointments, etc.  Non-urgent messages can be sent to your provider as well.   To learn more about what you can do with MyChart, go to [email or web address].    Your next appointment:   1st available  The format for your next appointment:   In Person  Provider:   Rozann Lesches, MD   Other Instructions None     Signed, Ermalinda Barrios, PA-C  04/15/2020 1:16 PM    [address], [place], [place]  [address] Phone: [phone]; Fax: [phone]

## 2020-04-13 DIAGNOSIS — Z992 Dependence on renal dialysis: Secondary | ICD-10-CM | POA: Diagnosis not present

## 2020-04-13 DIAGNOSIS — N186 End stage renal disease: Secondary | ICD-10-CM | POA: Diagnosis not present

## 2020-04-15 ENCOUNTER — Encounter: Payer: Self-pay | Admitting: Physician Assistant

## 2020-04-15 ENCOUNTER — Ambulatory Visit: Payer: Medicare Other | Admitting: Physician Assistant

## 2020-04-15 ENCOUNTER — Other Ambulatory Visit: Payer: Self-pay

## 2020-04-15 VITALS — BP 118/78 | HR 68 | Ht 65.0 in | Wt 188.0 lb

## 2020-04-15 DIAGNOSIS — N186 End stage renal disease: Secondary | ICD-10-CM

## 2020-04-15 DIAGNOSIS — I10 Essential (primary) hypertension: Secondary | ICD-10-CM

## 2020-04-15 DIAGNOSIS — I2510 Atherosclerotic heart disease of native coronary artery without angina pectoris: Secondary | ICD-10-CM

## 2020-04-15 DIAGNOSIS — I5042 Chronic combined systolic (congestive) and diastolic (congestive) heart failure: Secondary | ICD-10-CM

## 2020-04-15 DIAGNOSIS — I447 Left bundle-branch block, unspecified: Secondary | ICD-10-CM

## 2020-04-15 DIAGNOSIS — Z992 Dependence on renal dialysis: Secondary | ICD-10-CM

## 2020-04-15 DIAGNOSIS — I428 Other cardiomyopathies: Secondary | ICD-10-CM | POA: Diagnosis not present

## 2020-04-15 NOTE — Patient Instructions (Signed)
Medication Instructions:  Your physician recommends that you continue on your current medications as directed. Please refer to the Current Medication list given to you today.  *If you need a refill on your cardiac medications before your next appointment, please call your pharmacy*   Lab Work: None If you have labs (blood work) drawn today and your tests are completely normal, you will receive your results only by: Marland Kitchen MyChart Message (if you have MyChart) OR . A paper copy in the mail If you have any lab test that is abnormal or we need to change your treatment, we will call you to review the results.   Testing/Procedures: Your physician has requested that you have an echocardiogram. Echocardiography is a painless test that uses sound waves to create images of your heart. It provides your doctor with information about the size and shape of your heart and how well your heart's chambers and valves are working. This procedure takes approximately one hour. There are no restrictions for this procedure.     Follow-Up: At [facility], you and your health needs are our priority.  As part of our continuing mission to provide you with exceptional heart care, we have created designated Provider Care Teams.  These Care Teams include your primary Cardiologist (physician) and Advanced Practice Providers (APPs -  Physician Assistants and Nurse Practitioners) who all work together to provide you with the care you need, when you need it.  We recommend signing up for the patient portal called "MyChart".  Sign up information is provided on this After Visit Summary.  MyChart is used to connect with patients for Virtual Visits (Telemedicine).  Patients are able to view lab/test results, encounter notes, upcoming appointments, etc.  Non-urgent messages can be sent to your provider as well.   To learn more about what you can do with MyChart, go to [email or web address].    Your next appointment:   1st  available  The format for your next appointment:   In Person  Provider:   Rozann Lesches, MD   Other Instructions None

## 2020-04-16 DIAGNOSIS — N186 End stage renal disease: Secondary | ICD-10-CM | POA: Diagnosis not present

## 2020-04-16 DIAGNOSIS — Z992 Dependence on renal dialysis: Secondary | ICD-10-CM | POA: Diagnosis not present

## 2020-04-17 ENCOUNTER — Ambulatory Visit (HOSPITAL_COMMUNITY)
Admission: RE | Admit: 2020-04-17 | Discharge: 2020-04-17 | Disposition: A | Discharge status: Home / Self Care | Payer: Medicare Other | Source: Ambulatory Visit | Attending: Surgery | Admitting: Surgery

## 2020-04-17 ENCOUNTER — Other Ambulatory Visit (HOSPITAL_COMMUNITY): Payer: Self-pay | Admitting: Vascular Surgery

## 2020-04-17 ENCOUNTER — Ambulatory Visit: Payer: Medicare Other

## 2020-04-17 DIAGNOSIS — M79629 Pain in unspecified upper arm: Secondary | ICD-10-CM

## 2020-04-20 DIAGNOSIS — N186 End stage renal disease: Secondary | ICD-10-CM | POA: Diagnosis not present

## 2020-04-20 DIAGNOSIS — Z992 Dependence on renal dialysis: Secondary | ICD-10-CM | POA: Diagnosis not present

## 2020-04-23 DIAGNOSIS — N186 End stage renal disease: Secondary | ICD-10-CM | POA: Diagnosis not present

## 2020-04-23 DIAGNOSIS — Z992 Dependence on renal dialysis: Secondary | ICD-10-CM | POA: Diagnosis not present

## 2020-04-23 DIAGNOSIS — N2581 Secondary hyperparathyroidism of renal origin: Secondary | ICD-10-CM | POA: Diagnosis not present

## 2020-04-27 DIAGNOSIS — N186 End stage renal disease: Secondary | ICD-10-CM | POA: Diagnosis not present

## 2020-04-27 DIAGNOSIS — Z992 Dependence on renal dialysis: Secondary | ICD-10-CM | POA: Diagnosis not present

## 2020-04-29 ENCOUNTER — Other Ambulatory Visit: Payer: Self-pay

## 2020-04-29 ENCOUNTER — Ambulatory Visit (HOSPITAL_COMMUNITY)
Admission: RE | Admit: 2020-04-29 | Discharge: 2020-04-29 | Disposition: A | Discharge status: Home / Self Care | Payer: Medicare Other | Source: Ambulatory Visit | Attending: Physician Assistant | Admitting: Physician Assistant

## 2020-04-29 DIAGNOSIS — I5042 Chronic combined systolic (congestive) and diastolic (congestive) heart failure: Secondary | ICD-10-CM | POA: Diagnosis not present

## 2020-04-29 LAB — ECHOCARDIOGRAM COMPLETE
AR max vel: 1.73 cm2
AV Area VTI: 2.15 cm2
AV Area mean vel: 1.95 cm2
AV Mean grad: 9 mmHg
AV Peak grad: 18.1 mmHg
Ao pk vel: 2.13 m/s
Area-P 1/2: 4.21 cm2
Calc EF: 41.4 %
MV M vel: 4.78 m/s
MV Peak grad: 91.4 mmHg
Radius: 0.4 cm
S' Lateral: 4.55 cm
Single Plane A2C EF: 41.8 %
Single Plane A4C EF: 39.2 %

## 2020-04-29 NOTE — Progress Notes (Signed)
*  PRELIMINARY RESULTS* Echocardiogram 2D Echocardiogram has been performed.  Summer Cook 04/29/2020, 10:19 AM

## 2020-04-30 DIAGNOSIS — N186 End stage renal disease: Secondary | ICD-10-CM | POA: Diagnosis not present

## 2020-04-30 DIAGNOSIS — Z992 Dependence on renal dialysis: Secondary | ICD-10-CM | POA: Diagnosis not present

## 2020-05-01 ENCOUNTER — Ambulatory Visit: Payer: Medicare Other

## 2020-05-01 ENCOUNTER — Other Ambulatory Visit (HOSPITAL_COMMUNITY): Payer: Medicare Other

## 2020-05-01 NOTE — Progress Notes (Deleted)
Established Dialysis Access   History of Present Illness   Summer Cook is a 54 y.o. (Apr 11, 1966) female who presents for follow up of her left brachiocephalic arteriovenous fistula. This was created on August 07, 2019 by Dr. Oneida Alar.  She has now been seen on two prior visits complaining of dysesthesia of her left hand.  She has had normal motor strength and palpable pulses. Her last visit was on 10/18/19 at which time she had she reported mild dysesthesia of her left fingertips.  She denied any problems with motor function, cold sensation or left hand pain. It was explained again to her at her last visit that by nature of a fistula it does steal some blood flow away from her hand. Her fistula was ready for access and she was instructed to follow up if her symptoms worsened.  She presents today with worsening pain in her left arm and hand numbness.    She is currently dialyzing via her left IJ catheter MWF at **** without complications   The patient's PMH, PSH, SH, and FamHx were reviewed on *** are unchanged from ***.  Current Outpatient Medications  Medication Sig Dispense Refill  . acetaminophen (TYLENOL) 500 MG tablet Take 1,000 mg by mouth every 6 (six) hours as needed for moderate pain.    Marland Kitchen albuterol (PROVENTIL) (2.5 MG/3ML) 0.083% nebulizer solution Take 3 mLs (2.5 mg total) by nebulization every 4 (four) hours as needed for shortness of breath. 75 mL 12  . calcium acetate (PHOSLO) 667 MG capsule Take 2 capsules by mouth See admin instructions. Three times daily with meals and snacks    . furosemide (LASIX) 80 MG tablet Take 80 mg by mouth See admin instructions. Take 80 mg daily on non dialysis days - Monday, Wednesday, Thursday, Friday, Sunday    . hydrALAZINE (APRESOLINE) 50 MG tablet Take 50 mg by mouth 2 (two) times daily.    . isosorbide dinitrate (ISORDIL) 10 MG tablet Take 1 tablet (10 mg total) by mouth 2 (two) times daily. 180 tablet 3  . lidocaine-prilocaine (EMLA)  cream Apply 1 application topically as needed (numbing).     Marland Kitchen loratadine (CLARITIN) 10 MG tablet Take 10 mg by mouth daily as needed for allergies.    . metoprolol succinate (TOPROL-XL) 50 MG 24 hr tablet Take 50 mg by mouth daily. Take with or immediately following a meal.    . midodrine (PROAMATINE) 5 MG tablet Take 5 mg by mouth daily as needed (for  low blood pressure.).     Marland Kitchen nitroGLYCERIN (NITROSTAT) 0.4 MG SL tablet Place 1 tablet (0.4 mg total) under the tongue every 5 (five) minutes x 3 doses as needed for chest pain. 25 tablet 12  . Oxycodone HCl 10 MG TABS Take 1 tablet (10 mg total) by mouth every 6 (six) hours as needed. 12 tablet 0  . PROAIR HFA 108 (90 Base) MCG/ACT inhaler Inhale 2 puffs into the lungs every 4 (four) hours as needed for shortness of breath.    . sertraline (ZOLOFT) 100 MG tablet Take 100 mg by mouth daily.     No current facility-administered medications for this visit.   Facility-Administered Medications Ordered in Other Visits  Medication Dose Route Frequency Provider Last Rate Last Admin  . 0.9 %  sodium chloride infusion    Continuous PRN Neldon Newport, CRNA   New Bag at 07/18/19 0919    On ROS today: ***, ***   Physical Examination  There were  no vitals filed for this visit. There is no height or weight on file to calculate BMI.  General {LOC:19197::"Somulent","Alert"}, {Orientation:19197::"Confused","O x 3"}, {Weight:19197::"Obese","Cachectic","WD"}, {General state of health:19197::"Ill appearing","Elderly","NAD"}  Pulmonary {Chest wall:19197::"Asx chest movement","Sym exp"}, {Air movt:19197::"Decreased *** air movt","good B air movt"}, {BS:19197::"rales on ***","rhonchi on ***","wheezing on ***","CTA B"}  Cardiac {Rhythm:19197::"Irregularly, irregular rate and rhythm","RRR, Nl S1, S2"}, {Murmur:19197::"Murmur present: ***","no Murmurs"}, {Rubs:19197::"Rub present: ***","No rubs"}, {Gallop:19197::"Gallop present: ***","No S3,S4"}  Vascular Vessel  Right Left  Radial {Palpable:19197::"Not palpable","Faintly palpable","Palpable"} {Palpable:19197::"Not palpable","Faintly palpable","Palpable"}  Brachial {Palpable:19197::"Not palpable","Faintly palpable","Palpable"} {Palpable:19197::"Not palpable","Faintly palpable","Palpable"}  Ulnar {Palpable:19197::"Not palpable","Faintly palpable","Palpable"} {Palpable:19197::"Not palpable","Faintly palpable","Palpable"}    Musculo- skeletal M/S 5/5 throughout {MS:19197::"except ***"," "}, Extremities without ischemic changes {MS:19197::"except ***"," "}  Neurologic A&O; CN grossly intact     Non-invasive Vascular Imaging        Medical Decision Making   Summer Cook is a 53 y.o. female who presents for follow up of her left brachiocephalic AV fistula that continues to give her pain   ***   Summer Caldwell, PA-C [facility] Office: [phone]  Clinic MD: Dr. Oneida Alar

## 2020-05-04 DIAGNOSIS — Z992 Dependence on renal dialysis: Secondary | ICD-10-CM | POA: Diagnosis not present

## 2020-05-04 DIAGNOSIS — N186 End stage renal disease: Secondary | ICD-10-CM | POA: Diagnosis not present

## 2020-05-06 ENCOUNTER — Other Ambulatory Visit: Payer: Self-pay

## 2020-05-06 DIAGNOSIS — N186 End stage renal disease: Secondary | ICD-10-CM

## 2020-05-09 DIAGNOSIS — T8249XA Other complication of vascular dialysis catheter, initial encounter: Secondary | ICD-10-CM | POA: Diagnosis not present

## 2020-05-09 DIAGNOSIS — N186 End stage renal disease: Secondary | ICD-10-CM | POA: Diagnosis not present

## 2020-05-09 DIAGNOSIS — Z992 Dependence on renal dialysis: Secondary | ICD-10-CM | POA: Diagnosis not present

## 2020-05-11 DIAGNOSIS — N186 End stage renal disease: Secondary | ICD-10-CM | POA: Diagnosis not present

## 2020-05-11 DIAGNOSIS — Z992 Dependence on renal dialysis: Secondary | ICD-10-CM | POA: Diagnosis not present

## 2020-05-14 DIAGNOSIS — N186 End stage renal disease: Secondary | ICD-10-CM | POA: Diagnosis not present

## 2020-05-14 DIAGNOSIS — Z992 Dependence on renal dialysis: Secondary | ICD-10-CM | POA: Diagnosis not present

## 2020-05-15 DIAGNOSIS — H8113 Benign paroxysmal vertigo, bilateral: Secondary | ICD-10-CM | POA: Diagnosis not present

## 2020-05-15 DIAGNOSIS — J329 Chronic sinusitis, unspecified: Secondary | ICD-10-CM | POA: Diagnosis not present

## 2020-05-18 DIAGNOSIS — Z992 Dependence on renal dialysis: Secondary | ICD-10-CM | POA: Diagnosis not present

## 2020-05-18 DIAGNOSIS — N186 End stage renal disease: Secondary | ICD-10-CM | POA: Diagnosis not present

## 2020-05-20 ENCOUNTER — Ambulatory Visit (HOSPITAL_COMMUNITY)
Admission: RE | Admit: 2020-05-20 | Discharge: 2020-05-20 | Disposition: A | Discharge status: Home / Self Care | Payer: Medicare Other | Source: Ambulatory Visit | Attending: Internal Medicine | Admitting: Internal Medicine

## 2020-05-20 ENCOUNTER — Other Ambulatory Visit (HOSPITAL_COMMUNITY): Payer: Self-pay | Admitting: Internal Medicine

## 2020-05-20 ENCOUNTER — Encounter: Payer: Self-pay | Admitting: Cardiology

## 2020-05-20 ENCOUNTER — Other Ambulatory Visit: Payer: Self-pay

## 2020-05-20 DIAGNOSIS — R0600 Dyspnea, unspecified: Secondary | ICD-10-CM | POA: Insufficient documentation

## 2020-05-20 DIAGNOSIS — I517 Cardiomegaly: Secondary | ICD-10-CM | POA: Diagnosis not present

## 2020-05-20 DIAGNOSIS — J90 Pleural effusion, not elsewhere classified: Secondary | ICD-10-CM | POA: Diagnosis not present

## 2020-05-20 DIAGNOSIS — R05 Cough: Secondary | ICD-10-CM | POA: Diagnosis not present

## 2020-05-20 NOTE — Progress Notes (Signed)
Virtual Visit via Telephone Note   This visit type was conducted due to national recommendations for restrictions regarding the COVID-19 Pandemic (e.g. social distancing) in an effort to limit this patient's exposure and mitigate transmission in our community.  Due to her co-morbid illnesses, this patient is at least at moderate risk for complications without adequate follow up.  This format is felt to be most appropriate for this patient at this time.  The patient did not have access to video technology/had technical difficulties with video requiring transitioning to audio format only (telephone).  All issues noted in this document were discussed and addressed.  No physical exam could be performed with this format.  Please refer to the patient's chart for her  consent to telehealth for [facility].    Date:  05/21/2020   ID:  Marcelino Freestone, DOB Feb 22, 1966, MRN [id number] The patient was identified using 2 identifiers.  Patient Location: Home Provider Location: Office/Clinic  PCP:  Redmond School, MD  Cardiologist:  Rozann Lesches, MD Electrophysiologist:  None   Evaluation Performed:  Follow-Up Visit  Chief Complaint:  Cardiac follow-up  History of Present Illness:    Summer Cook is a 54 y.o. female last seen in July by Ms. Bonnell Public PA-C.  We spoke by phone today.  She tells me that she has had an intermittent cough since June, states that this has been an issue since her second COVID-19 vaccine.  No fevers or chills, no wheezing, cough is not productive.  She had a chest x-ray done per PCP yesterday which showed improved pleural effusions and no infiltrates.  Her weight is actually down and she states that she has been tolerating hemodialysis on a twice daily twice weekly basis, 3.5 hour sessions, no major fluid reaccumulation, still making urine on Lasix.  Recent follow-up echocardiogram in early August revealed LVEF stable at 40%, mild LVH, normal RV contraction, severe left  atrial enlargement with moderate mitral regurgitation, PASP estimated 41 mmHg.  She reports evaluation for possible renal transplantation at [facility], was told that her LVEF needed to be 50%.  Current cardiac regimen includes Toprol-XL, hydralazine, Isordil, and Lasix.  She has a nonischemic cardiomyopathy, cardiac catheterization from 2018 showed distal PDA occlusion that was managed medically, otherwise no obstructive disease.   Past Medical History:  Diagnosis Date  . Allergy   . Anxiety   . Arthritis   . Asthma   . CAD (coronary artery disease), native coronary artery    a. cath 05/20/17 - 100% distal PDA --> medical mangement (no intervention done)  . Candida esophagitis ([place])   . Chronic combined systolic and diastolic CHF (congestive heart failure) ([place])   . Depression   . End stage renal disease on dialysis [facility])    T/ Th/ Sat starting 4/28- [facility]  . Essential hypertension   . GERD (gastroesophageal reflux disease)   . Gout   . Headache   . Hemorrhoids   . History of kidney stones   . 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  Last was Prednisone $RemoveBefore'40mg'gIkQuBhJeOgMR$ ; 2 wk in 10/2012.  She also was on Prednisone bridged with Cellcept briefly but stopped due to lack of insurance.   . Myocardial infarction ([place])   . Perinephric hematoma   . Pneumonia   . Pulmonary embolism ([place]) 04/2012  . Serrated adenoma of colon   . Steroid-induced diabetes (HCC)    Prednisone  .  Stroke [facility])    Past Surgical History:  Procedure Laterality Date  . AV FISTULA PLACEMENT Right 01/23/2019   Procedure: ARTERIOVENOUS (AV) FISTULA CREATION RIGHT ARM;  Surgeon: Elam Dutch, MD;  Location: [place];  Service: Vascular;  Laterality: Right;  . AV FISTULA PLACEMENT Right 01/30/2019   Procedure: GORETEX GRAFT PLACEMENT  RIGHT ARM;  Surgeon: Elam Dutch, MD;  Location: [facility] OR;  Service: Vascular;   Laterality: Right;  . AV FISTULA PLACEMENT Left 08/07/2019   Procedure: LEFT BRACHIOCEPHALIC VENOUS FISTULA;  Surgeon: Elam Dutch, MD;  Location: [place];  Service: Vascular;  Laterality: Left;  . BONE MARROW BIOPSY    . CARDIAC CATHETERIZATION    . CARPAL TUNNEL RELEASE    . CARPAL TUNNEL RELEASE Left 05/20/2016   Procedure: CARPAL TUNNEL RELEASE;  Surgeon: Carole Civil, MD;  Location: [facility];  Service: Orthopedics;  Laterality: Left;  . CARPAL TUNNEL RELEASE Right 07/16/2016   Procedure: RIGHT CARPAL TUNNEL RELEASE;  Surgeon: Carole Civil, MD;  Location: [facility];  Service: Orthopedics;  Laterality: Right;  . CATARACT EXTRACTION    . CHOLECYSTECTOMY  2008  . COLONOSCOPY WITH ESOPHAGOGASTRODUODENOSCOPY (EGD) N/A 01/04/2013   Dr. Gala Romney: esophageal plaques with +KOH, hh. Gastric antrum abnormal, bx reactive gastropathy. Anal canal hemorrhoids, colonic tics, normal TI, single polyp (sessile serrated tubular adenoma). Next TCS 12/2017  . IR FLUORO GUIDE CV LINE RIGHT  01/10/2019  . IR US GUIDE VASC ACCESS RIGHT  01/10/2019  . LEFT HEART CATH AND CORONARY ANGIOGRAPHY N/A 05/20/2017   Procedure: LEFT HEART CATH AND CORONARY ANGIOGRAPHY;  Surgeon: Martinique, Peter M, MD;  Location: [place] CV LAB;  Service: Cardiovascular;  Laterality: N/A;  . 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 TEE WITHOUT CARDIOVERSION N/A 01/03/2016   Procedure: TRANSESOPHAGEAL ECHOCARDIOGRAM (TEE) WITH PROPOFOL;  Surgeon: Satira Sark, MD;  Location[facility];  Service: Cardiovascular;  Laterality: N/A;     Current Meds  Medication Sig  . acetaminophen (TYLENOL) 500 MG tablet Take 1,000 mg by mouth every 6 (six) hours as needed for moderate pain.  Marland Kitchen albuterol (PROVENTIL) (2.5 MG/3ML) 0.083% nebulizer solution Take 3 mLs (2.5 mg total) by nebulization every 4 (four) hours as needed for shortness of breath.  . calcium acetate (PHOSLO) 667 MG capsule Take 2 capsules by mouth See admin instructions. Three times daily  with meals and snacks  . furosemide (LASIX) 80 MG tablet Take 80 mg by mouth See admin instructions. Take 80 mg daily on non dialysis days - Monday, Wednesday, Thursday, Friday, Sunday  . hydrALAZINE (APRESOLINE) 50 MG tablet Take 50 mg by mouth 2 (two) times daily.  . isosorbide dinitrate (ISORDIL) 10 MG tablet Take 1 tablet (10 mg total) by mouth 2 (two) times daily.  Marland Kitchen lidocaine-prilocaine (EMLA) cream Apply 1 application topically as needed (numbing).   Marland Kitchen loratadine (CLARITIN) 10 MG tablet Take 10 mg by mouth daily as needed for allergies.  . metoprolol succinate (TOPROL-XL) 50 MG 24 hr tablet Take 50 mg by mouth daily. Take with or immediately following a meal.  . midodrine (PROAMATINE) 5 MG tablet Take 5 mg by mouth daily as needed (for  low blood pressure.).   Marland Kitchen nitroGLYCERIN (NITROSTAT) 0.4 MG SL tablet Place 1 tablet (0.4 mg total) under the tongue every 5 (five) minutes x 3 doses as needed for chest pain (if no relief after 3rd dose, proceed to the ED for an evaluation or call 911).  . Oxycodone HCl  10 MG TABS Take 1 tablet (10 mg total) by mouth every 6 (six) hours as needed.  Marland Kitchen PROAIR HFA 108 (90 Base) MCG/ACT inhaler Inhale 2 puffs into the lungs every 4 (four) hours as needed for shortness of breath.  . sertraline (ZOLOFT) 100 MG tablet Take 100 mg by mouth daily.  . [DISCONTINUED] nitroGLYCERIN (NITROSTAT) 0.4 MG SL tablet Place 1 tablet (0.4 mg total) under the tongue every 5 (five) minutes x 3 doses as needed for chest pain.     Allergies:   Ciprofloxacin, Ceftriaxone, Dilaudid [hydromorphone hcl], Yellow jacket venom, Doxycycline, Adhesive [tape], Brintellix [vortioxetine], Cymbalta [duloxetine hcl], and Penicillins   ROS:   Cough   Prior CV studies:   The following studies were reviewed today:  Echocardiogram 04/29/2020: 1. Left ventricular ejection fraction, by estimation, is 40%. The left  ventricle has mildly decreased function. The left ventricle demonstrates  global  hypokinesis. There is mild left ventricular hypertrophy. Left  ventricular diastolic parameters are  indeterminate.  2. Right ventricular systolic function is normal. The right ventricular  size is normal. There is mildly elevated pulmonary artery systolic  pressure.  3. Left atrial size was severely dilated.  4. Right atrial size was moderately dilated.  5. The mitral valve is abnormal. Moderate mitral valve regurgitation. No  evidence of mitral stenosis.  6. The aortic valve is tricuspid. Aortic valve regurgitation is not  visualized. No aortic stenosis is present.  7. There is moderate pulmonary HTN, PASP is 41 mmHg.  8. The inferior vena cava is dilated in size with >50% respiratory  variability, suggesting right atrial pressure of 8 mmHg.   Chest x-ray 05/20/2020: FINDINGS: Mild cardiomegaly. Large-bore multi lumen left chest vascular catheter. Small left, trace right pleural effusions, decreased in volume compared to prior examination. The visualized skeletal structures are unremarkable.  IMPRESSION: Small left, trace right pleural effusions, decreased in volume compared to prior examination. No new or focal airspace opacity.  Labs/Other Tests and Data Reviewed:    EKG:  An ECG dated 03/23/2020 was personally reviewed today and demonstrated:  Sinus tachycardia with left bundle branch block.  Recent Labs: 02/07/2020: ALT 21; Magnesium 2.6 03/23/2020: B Natriuretic Peptide 2,843.0; Hemoglobin 11.3; Platelets 162 03/25/2020: BUN 38; Creatinine, Ser 4.09; Potassium 3.5; Sodium 132   Recent Lipid Panel Lab Results  Component Value Date/Time   CHOL 229 (H) 05/19/2017 07:12 PM   TRIG 112 01/10/2019 02:34 AM   HDL 57 05/19/2017 07:12 PM   CHOLHDL 4.0 05/19/2017 07:12 PM   LDLCALC 149 (H) 05/19/2017 07:12 PM    Wt Readings from Last 3 Encounters:  05/21/20 182 lb 9.6 oz (82.8 kg)  04/15/20 188 lb (85.3 kg)  03/25/20 187 lb 2.7 oz (84.9 kg)     Objective:    Vital  Signs:  Ht 5\' 5"  (1.651 m)   Wt 182 lb 9.6 oz (82.8 kg)   LMP 08/13/2011   BMI 30.39 kg/m    Not able to assess vital signs today. Patient spoke in full sentences, not short of breath on the phone.  ASSESSMENT & PLAN:    1.  Nonischemic cardiomyopathy, LVEF stable at 40% by recent follow-up echocardiogram.  RV function is normal and PASP mildly elevated.  Fluid status is largely managed by hemodialysis, but she does have response to Lasix as well.  Left bundle branch block present by ECG.  She is currently on Toprol-XL, hydralazine, Isordil, and Lasix.  I talked with her today about referral to the  heart failure clinic for further evaluation, mainly to address whether there are any significant medication adjustments or additional testing that would be beneficial.  She might potentially be a candidate for resynchronization therapy, but device infection risk would be increased in the setting of ESRD on hemodialysis, so I have not been enthusiastic so far about referring her to EP.  2.  ESRD on hemodialysis.  She states that she has had transplant evaluation at [facility], was told that her LVEF needed to be 50%.  At this point she is doing hemodialysis sessions twice weekly with reasonable fluid control.  She does have midodrine to take for low blood pressures, reports that this has not been an issue recently since having her vascular access changed.  3.  CAD with occluded distal PDA managed medically, otherwise no significant obstructive disease by cardiac catheterization in 2018.   Time:   Today, I have spent 15 minutes with the patient with telehealth technology discussing the above problems.     Medication Adjustments/Labs and Tests Ordered: Current medicines are reviewed at length with the patient today.  Concerns regarding medicines are outlined above.   Tests Ordered: Orders Placed This Encounter  Procedures  . AMB referral to CHF clinic    Medication Changes: Meds ordered this  encounter  Medications  . nitroGLYCERIN (NITROSTAT) 0.4 MG SL tablet    Sig: Place 1 tablet (0.4 mg total) under the tongue every 5 (five) minutes x 3 doses as needed for chest pain (if no relief after 3rd dose, proceed to the ED for an evaluation or call 911).    Dispense:  25 tablet    Refill:  3    Follow Up:  In Person 3 months in the [place] office.  Signed, Rozann Lesches, MD  05/21/2020 9:14 AM    [place]

## 2020-05-21 ENCOUNTER — Telehealth (INDEPENDENT_AMBULATORY_CARE_PROVIDER_SITE_OTHER): Payer: Medicare Other | Admitting: Cardiology

## 2020-05-21 ENCOUNTER — Encounter: Payer: Self-pay | Admitting: Cardiology

## 2020-05-21 ENCOUNTER — Telehealth: Payer: Self-pay | Admitting: *Deleted

## 2020-05-21 VITALS — Ht 65.0 in | Wt 182.6 lb

## 2020-05-21 DIAGNOSIS — I428 Other cardiomyopathies: Secondary | ICD-10-CM

## 2020-05-21 DIAGNOSIS — Z992 Dependence on renal dialysis: Secondary | ICD-10-CM | POA: Diagnosis not present

## 2020-05-21 DIAGNOSIS — I2510 Atherosclerotic heart disease of native coronary artery without angina pectoris: Secondary | ICD-10-CM

## 2020-05-21 DIAGNOSIS — N186 End stage renal disease: Secondary | ICD-10-CM

## 2020-05-21 MED ORDER — NITROGLYCERIN 0.4 MG SL SUBL: 0.4000 mg | SUBLINGUAL_TABLET | SUBLINGUAL | 3 refills | Status: AC | PRN

## 2020-05-21 NOTE — Patient Instructions (Signed)
Medication Instructions:   Your physician recommends that you continue on your current medications as directed. Please refer to the Current Medication list given to you today.  Labwork:  NONE  Testing/Procedures:  NONE  Follow-Up:  Your physician recommends that you schedule a follow-up appointment in: 3 months.  Any Other Special Instructions Will Be Listed Below (If Applicable).  You have been referred to the Heart Failure Clinic  If you need a refill on your cardiac medications before your next appointment, please call your pharmacy.

## 2020-05-21 NOTE — Telephone Encounter (Signed)
  Patient Consent for Virtual Visit         Summer Cook has provided verbal consent on 05/21/2020 for a virtual visit (video or telephone).   CONSENT FOR VIRTUAL VISIT FOR:  Summer Cook  By participating in this virtual visit I agree to the following:  I hereby voluntarily request, consent and authorize [facility] and its employed or contracted physicians, physician assistants, nurse practitioners or other licensed health care professionals (the Practitioner), to provide me with telemedicine health care services (the "Services") as deemed necessary by the treating Practitioner. I acknowledge and consent to receive the Services by the Practitioner via telemedicine. I understand that the telemedicine visit will involve communicating with the Practitioner through live audiovisual communication technology and the disclosure of certain medical information by electronic transmission. I acknowledge that I have been given the opportunity to request an in-person assessment or other available alternative prior to the telemedicine visit and am voluntarily participating in the telemedicine visit.  I understand that I have the right to withhold or withdraw my consent to the use of telemedicine in the course of my care at any time, without affecting my right to future care or treatment, and that the Practitioner or I may terminate the telemedicine visit at any time. I understand that I have the right to inspect all information obtained and/or recorded in the course of the telemedicine visit and may receive copies of available information for a reasonable fee.  I understand that some of the potential risks of receiving the Services via telemedicine include:  Marland Kitchen Delay or interruption in medical evaluation due to technological equipment failure or disruption; . Information transmitted may not be sufficient (e.g. poor resolution of images) to allow for appropriate medical decision making by the Practitioner;  and/or  . In rare instances, security protocols could fail, causing a breach of personal health information.  Furthermore, I acknowledge that it is my responsibility to provide information about my medical history, conditions and care that is complete and accurate to the best of my ability. I acknowledge that Practitioner's advice, recommendations, and/or decision may be based on factors not within their control, such as incomplete or inaccurate data provided by me or distortions of diagnostic images or specimens that may result from electronic transmissions. I understand that the practice of medicine is not an exact science and that Practitioner makes no warranties or guarantees regarding treatment outcomes. I acknowledge that a copy of this consent can be made available to me via my patient portal ([facility]), or I can request a printed copy by calling the office of [place].    I understand that my insurance will be billed for this visit.   I have read or had this consent read to me. . I understand the contents of this consent, which adequately explains the benefits and risks of the Services being provided via telemedicine.  . I have been provided ample opportunity to ask questions regarding this consent and the Services and have had my questions answered to my satisfaction. . I give my informed consent for the services to be provided through the use of telemedicine in my medical care

## 2020-05-22 ENCOUNTER — Ambulatory Visit (HOSPITAL_COMMUNITY)
Admission: RE | Admit: 2020-05-22 | Discharge: 2020-05-22 | Disposition: A | Discharge status: Home / Self Care | Payer: Medicare Other | Source: Ambulatory Visit | Attending: Internal Medicine | Admitting: Internal Medicine

## 2020-05-22 ENCOUNTER — Other Ambulatory Visit: Payer: Self-pay

## 2020-05-22 ENCOUNTER — Encounter: Payer: Self-pay | Admitting: Physician Assistant

## 2020-05-22 ENCOUNTER — Ambulatory Visit (INDEPENDENT_AMBULATORY_CARE_PROVIDER_SITE_OTHER): Payer: Medicare Other | Admitting: Physician Assistant

## 2020-05-22 ENCOUNTER — Encounter (HOSPITAL_COMMUNITY): Payer: Medicare Other

## 2020-05-22 VITALS — BP 106/70 | HR 83 | Temp 98.0°F | Resp 20 | Ht 65.0 in | Wt 186.6 lb

## 2020-05-22 DIAGNOSIS — Z992 Dependence on renal dialysis: Secondary | ICD-10-CM

## 2020-05-22 DIAGNOSIS — N186 End stage renal disease: Secondary | ICD-10-CM

## 2020-05-22 NOTE — H&P (View-Only) (Signed)
[facility] [place] HISTORY AND PHYSICAL   History of Present Illness:  Patient is a 54 y.o. year old female who presents for of steal symptoms s/p left BC fistula creation by Dr. Oneida Alar on 08/07/19.  She has previous access x 2 in the right UE and previous carpal tunnel surgery B UE.  She had a previous right upper arm AV graft which failed after about 3 months despite 1 angioplasty and stenting procedure.   Her chief complaints are: left hand weakness feeling.  She denise non healing wounds and loss of sensation.    Past Medical History:  Diagnosis Date  . Allergy   . Anxiety   . Arthritis   . Asthma   . CAD (coronary artery disease), native coronary artery    a. cath 05/20/17 - 100% distal PDA --> medical mangement (no intervention done)  . Candida esophagitis ([place])   . Chronic combined systolic and diastolic CHF (congestive heart failure) ([place])   . Depression   . End stage renal disease on dialysis [facility])    T/ Th/ Sat starting 4/28- [facility]  . Essential hypertension   . GERD (gastroesophageal reflux disease)   . Gout   . Headache   . Hemorrhoids   . History of kidney stones   . 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  Last was Prednisone $RemoveBefore'40mg'QiAShbvdCzgwY$ ; 2 wk in 10/2012.  She also was on Prednisone bridged with Cellcept briefly but stopped due to lack of insurance.   . Myocardial infarction ([place])   . Perinephric hematoma   . Pneumonia   . Pulmonary embolism ([place]) 04/2012  . Serrated adenoma of colon   . Steroid-induced diabetes (HCC)    Prednisone  . Stroke [facility])     Past Surgical History:  Procedure Laterality Date  . AV FISTULA PLACEMENT Right 01/23/2019   Procedure: ARTERIOVENOUS (AV) FISTULA CREATION RIGHT ARM;  Surgeon: Elam Dutch, MD;  Location: [place];  Service: Vascular;  Laterality: Right;  . AV FISTULA PLACEMENT Right 01/30/2019    Procedure: GORETEX GRAFT PLACEMENT  RIGHT ARM;  Surgeon: Elam Dutch, MD;  [facility] OR;  Service: Vascular;  Laterality: Right;  . AV FISTULA PLACEMENT Left 08/07/2019   Procedure: LEFT BRACHIOCEPHALIC VENOUS FISTULA;  Surgeon: Elam Dutch, MD;  Location: [place];  Service: Vascular;  Laterality: Left;  . BONE MARROW BIOPSY    . CARDIAC CATHETERIZATION    . CARPAL TUNNEL RELEASE    . CARPAL TUNNEL RELEASE Left 05/20/2016   Procedure: CARPAL TUNNEL RELEASE;  Surgeon: Carole Civil, MD;  [facility];  Service: Orthopedics;  Laterality: Left;  . CARPAL TUNNEL RELEASE Right 07/16/2016   Procedure: RIGHT CARPAL TUNNEL RELEASE;  Surgeon: Carole Civil, MD;  [facility];  Service: Orthopedics;  Laterality: Right;  . CATARACT EXTRACTION    . CHOLECYSTECTOMY  2008  . COLONOSCOPY WITH ESOPHAGOGASTRODUODENOSCOPY (EGD) N/A 01/04/2013   Dr. Gala Romney: esophageal plaques with +KOH, hh. Gastric antrum abnormal, bx reactive gastropathy. Anal canal hemorrhoids, colonic tics, normal TI, single polyp (sessile serrated tubular adenoma). Next TCS 12/2017  . IR FLUORO GUIDE CV LINE RIGHT  01/10/2019  . IR US GUIDE VASC ACCESS RIGHT  01/10/2019  . LEFT HEART CATH AND CORONARY ANGIOGRAPHY N/A 05/20/2017   Procedure: LEFT HEART CATH AND CORONARY ANGIOGRAPHY;  Surgeon: Martinique, Peter M, MD;  Location: [place] CV LAB;  Service:  Cardiovascular;  Laterality: N/A;  . 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 TEE WITHOUT CARDIOVERSION N/A 01/03/2016   Procedure: TRANSESOPHAGEAL ECHOCARDIOGRAM (TEE) WITH PROPOFOL;  Surgeon: Satira Sark, MD;  [facility];  Service: Cardiovascular;  Laterality: N/A;     Social History Social History   Tobacco Use  . Smoking status: Former Smoker    Years: 0.00    Types: Cigarettes    Quit date: 02/14/2009    Years since quitting: 11.2  . Smokeless tobacco: Current User    Types: Snuff  Vaping Use  . Vaping Use: Never used  Substance Use Topics  .  Alcohol use: No    Alcohol/week: 0.0 standard drinks  . Drug use: No    Family History Family History  Problem Relation Age of Onset  . Cervical cancer Mother   . Lung cancer Father   . Colon polyps Father        ???  . Pneumonia Brother   . Colon cancer Paternal Grandmother 88  . AAA (abdominal aortic aneurysm) Paternal Grandmother   . Breast cancer Paternal Grandmother   . Colon cancer Paternal Grandfather 76  . Barrett's esophagus Paternal Aunt   . Stomach cancer Neg Hx   . Pancreatic cancer Neg Hx     Allergies  Allergies  Allergen Reactions  . Ciprofloxacin Nausea And Vomiting  . Ceftriaxone Rash  . Dilaudid [Hydromorphone Hcl] Anxiety and Other (See Comments)    Causes hallucinations, anxiety, and panic per patient  . Yellow Jacket Venom Swelling    Swelling at injection site  . Doxycycline   . Adhesive [Tape] Rash    Please use paper tape  . Brintellix [Vortioxetine] Itching  . Cymbalta [Duloxetine Hcl] Itching  . Penicillins Itching    Did it involve swelling of the face/tongue/throat, SOB, or low BP? No Did it involve sudden or severe rash/hives, skin peeling, or any reaction on the inside of your mouth or nose? No Did you need to seek medical attention at a hospital or doctor's office? No When did it last happen?54 yrs old If all above answers are "NO", may proceed with cephalosporin use.     Current Outpatient Medications  Medication Sig Dispense Refill  . acetaminophen (TYLENOL) 500 MG tablet Take 1,000 mg by mouth every 6 (six) hours as needed for moderate pain.    Marland Kitchen albuterol (PROVENTIL) (2.5 MG/3ML) 0.083% nebulizer solution Take 3 mLs (2.5 mg total) by nebulization every 4 (four) hours as needed for shortness of breath. 75 mL 12  . calcium acetate (PHOSLO) 667 MG capsule Take 2 capsules by mouth See admin instructions. Three times daily with meals and snacks    . cephALEXin (KEFLEX) 500 MG capsule Take 500 mg by mouth 4 (four) times daily.     . furosemide (LASIX) 80 MG tablet Take 80 mg by mouth See admin instructions. Take 80 mg daily on non dialysis days - Monday, Wednesday, Thursday, Friday, Sunday    . hydrALAZINE (APRESOLINE) 50 MG tablet Take 50 mg by mouth 2 (two) times daily.    Marland Kitchen lidocaine-prilocaine (EMLA) cream Apply 1 application topically as needed (numbing).     Marland Kitchen loratadine (CLARITIN) 10 MG tablet Take 10 mg by mouth daily as needed for allergies.    Marland Kitchen meclizine (ANTIVERT) 25 MG tablet Take 25 mg by mouth 4 (four) times daily as needed.    . metoprolol succinate (TOPROL-XL) 50 MG 24 hr tablet Take 50 mg by mouth daily. Take with  or immediately following a meal.    . midodrine (PROAMATINE) 5 MG tablet Take 5 mg by mouth daily as needed (for  low blood pressure.).     Marland Kitchen nitroGLYCERIN (NITROSTAT) 0.4 MG SL tablet Place 1 tablet (0.4 mg total) under the tongue every 5 (five) minutes x 3 doses as needed for chest pain (if no relief after 3rd dose, proceed to the ED for an evaluation or call 911). 25 tablet 3  . Oxycodone HCl 10 MG TABS Take 1 tablet (10 mg total) by mouth every 6 (six) hours as needed. 12 tablet 0  . PROAIR HFA 108 (90 Base) MCG/ACT inhaler Inhale 2 puffs into the lungs every 4 (four) hours as needed for shortness of breath.    . sertraline (ZOLOFT) 100 MG tablet Take 100 mg by mouth daily.    . isosorbide dinitrate (ISORDIL) 10 MG tablet Take 1 tablet (10 mg total) by mouth 2 (two) times daily. 180 tablet 3   No current facility-administered medications for this visit.   Facility-Administered Medications Ordered in Other Visits  Medication Dose Route Frequency Provider Last Rate Last Admin  . 0.9 %  sodium chloride infusion    Continuous PRN Neldon Newport, CRNA   New Bag at 07/18/19 0919    ROS:   General:  No weight loss, Fever, chills  HEENT: No recent headaches, no nasal bleeding, no visual changes, no sore throat  Neurologic: No dizziness, blackouts, seizures. No recent symptoms of stroke  or mini- stroke. No recent episodes of slurred speech, or temporary blindness.  Cardiac: No recent episodes of chest pain/pressure, no shortness of breath at rest.  No shortness of breath with exertion.  Denies history of atrial fibrillation or irregular heartbeat  Vascular: No history of rest pain in feet.  No history of claudication.  No history of non-healing ulcer, No history of DVT   Pulmonary: No home oxygen, no productive cough, no hemoptysis,  No asthma or wheezing  Musculoskeletal:  [ ]  Arthritis, [ ]  Low back pain,  [ ]  Joint pain  Hematologic:No history of hypercoagulable state.  No history of easy bleeding.  No history of anemia  Gastrointestinal: No hematochezia or melena,  No gastroesophageal reflux, no trouble swallowing  Urinary: [ ]  chronic Kidney disease, [x ] on HD - [ ]  MWF or [ ]  TTHS, [ ]  Burning with urination, [ ]  Frequent urination, [ ]  Difficulty urinating;   Skin: No rashes  Psychological: No history of anxiety,  No history of depression   Physical Examination  Vitals:   05/22/20 0839  BP: 106/70  Pulse: 83  Resp: 20  Temp: 98 F (36.7 C)  TempSrc: Temporal  SpO2: 95%  Weight: 186 lb 9.6 oz (84.6 kg)  Height: 5\' 5"  (1.651 m)    Body mass index is 31.05 kg/m.  General:  Alert and oriented, no acute distress HEENT: Normal Neck: No bruit or JVD Pulmonary: Clear to auscultation bilaterally Cardiac: Regular Rate and Rhythm without murmur Gastrointestinal: Soft, non-tender, non-distended, no mass, no scars Skin: No rash Extremity Pulses:  2+ radial, brachial pulses bilaterally, left palpable thrill in fistula Musculoskeletal: left UE with visible venous branches and edema.  Neurologic: Upper and lower extremity motor 5/5 and symmetric  DATA:     Findings:    +-------------+----------------------+-----------------+-------------------  ----+          PPG w/o compression PPG w compression    Comments        +-------------+----------------------+-----------------+-------------------  ----+  Right Digit 2  normal waveform/                                  pressure 110mmHG                         +-------------+----------------------+-----------------+-------------------  ----+  Left Digit 2  abnormal waveform/ normal waveform/ Waveform  normalizes and           pressure 117mmHg  pressure 130mmHg pressure increases  with                               AVF  compression.    +-------------+----------------------+-----------------+-------------------  ----+     Summary:  Left 2nd digit waveform and pressure improves with AVF compression  consistent  with steal.   ASSESSMENT:   Left AV fistula unable to successfully use It has been infiltrated x 2 and she is still relying on a left TDC for TTh HD only 2 days a week she is still producing urine.    She has mild weakness in her left hand, but grip is equal on exam.  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  She has a palpable radial pulse.    PLAN:  She has been scheduled for fistulogram and possible intervention.  In the mean time she will continue to use the left UE to improve strength and the left TDC that was recently exchanged by [facility].    Roxy Horseman PA-C [facility] [place] Office: [phone]  MD in clinic [place]

## 2020-05-22 NOTE — Progress Notes (Signed)
[facility]  HISTORY AND PHYSICAL   History of Present Illness:  Patient is a 54 y.o. year old female who presents for of steal symptoms s/p left BC fistula creation by Dr. Oneida Alar on 08/07/19.  She has previous access x 2 in the right UE and previous carpal tunnel surgery B UE.  She had a previous right upper arm AV graft which failed after about 3 months despite 1 angioplasty and stenting procedure.   Her chief complaints are: left hand weakness feeling.  She denise non healing wounds and loss of sensation.    Past Medical History:  Diagnosis Date  . Allergy   . Anxiety   . Arthritis   . Asthma   . CAD (coronary artery disease), native coronary artery    a. cath 05/20/17 - 100% distal PDA --> medical mangement (no intervention done)  . Candida esophagitis ([place])   . Chronic combined systolic and diastolic CHF (congestive heart failure) ([place])   . Depression   . End stage renal disease on dialysis [facility])    T/ Th/ Sat starting 4/28- [facility]  . Essential hypertension   . GERD (gastroesophageal reflux disease)   . Gout   . Headache   . Hemorrhoids   . History of kidney stones   . 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  Last was Prednisone $RemoveBefore'40mg'atUvmEXyAxttp$ ; 2 wk in 10/2012.  She also was on Prednisone bridged with Cellcept briefly but stopped due to lack of insurance.   . Myocardial infarction ([place])   . Perinephric hematoma   . Pneumonia   . Pulmonary embolism ([place]) 04/2012  . Serrated adenoma of colon   . Steroid-induced diabetes (HCC)    Prednisone  . Stroke [facility])     Past Surgical History:  Procedure Laterality Date  . AV FISTULA PLACEMENT Right 01/23/2019   Procedure: ARTERIOVENOUS (AV) FISTULA CREATION RIGHT ARM;  Surgeon: Elam Dutch, MD;  Location: [place];  Service: Vascular;  Laterality: Right;  . AV FISTULA PLACEMENT Right 01/30/2019    Procedure: GORETEX GRAFT PLACEMENT  RIGHT ARM;  Surgeon: Elam Dutch, MD;  Location: [facility] OR;  Service: Vascular;  Laterality: Right;  . AV FISTULA PLACEMENT Left 08/07/2019   Procedure: LEFT BRACHIOCEPHALIC VENOUS FISTULA;  Surgeon: Elam Dutch, MD;  Location: [place];  Service: Vascular;  Laterality: Left;  . BONE MARROW BIOPSY    . CARDIAC CATHETERIZATION    . CARPAL TUNNEL RELEASE    . CARPAL TUNNEL RELEASE Left 05/20/2016   Procedure: CARPAL TUNNEL RELEASE;  Surgeon: Carole Civil, MD;  [facility];  Service: Orthopedics;  Laterality: Left;  . CARPAL TUNNEL RELEASE Right 07/16/2016   Procedure: RIGHT CARPAL TUNNEL RELEASE;  Surgeon: Carole Civil, MD;  [facility];  Service: Orthopedics;  Laterality: Right;  . CATARACT EXTRACTION    . CHOLECYSTECTOMY  2008  . COLONOSCOPY WITH ESOPHAGOGASTRODUODENOSCOPY (EGD) N/A 01/04/2013   Dr. Gala Romney: esophageal plaques with +KOH, hh. Gastric antrum abnormal, bx reactive gastropathy. Anal canal hemorrhoids, colonic tics, normal TI, single polyp (sessile serrated tubular adenoma). Next TCS 12/2017  . IR FLUORO GUIDE CV LINE RIGHT  01/10/2019  . IR US GUIDE VASC ACCESS RIGHT  01/10/2019  . LEFT HEART CATH AND CORONARY ANGIOGRAPHY N/A 05/20/2017   Procedure: LEFT HEART CATH AND CORONARY ANGIOGRAPHY;  Surgeon: Martinique, Peter M, MD;  Location: [place] CV LAB;  Service:  Cardiovascular;  Laterality: N/A;  . 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 TEE WITHOUT CARDIOVERSION N/A 01/03/2016   Procedure: TRANSESOPHAGEAL ECHOCARDIOGRAM (TEE) WITH PROPOFOL;  Surgeon: Satira Sark, MD;  [facility];  Service: Cardiovascular;  Laterality: N/A;     Social History Social History   Tobacco Use  . Smoking status: Former Smoker    Years: 0.00    Types: Cigarettes    Quit date: 02/14/2009    Years since quitting: 11.2  . Smokeless tobacco: Current User    Types: Snuff  Vaping Use  . Vaping Use: Never used  Substance Use Topics  .  Alcohol use: No    Alcohol/week: 0.0 standard drinks  . Drug use: No    Family History Family History  Problem Relation Age of Onset  . Cervical cancer Mother   . Lung cancer Father   . Colon polyps Father        ???  . Pneumonia Brother   . Colon cancer Paternal Grandmother 88  . AAA (abdominal aortic aneurysm) Paternal Grandmother   . Breast cancer Paternal Grandmother   . Colon cancer Paternal Grandfather 46  . Barrett's esophagus Paternal Aunt   . Stomach cancer Neg Hx   . Pancreatic cancer Neg Hx     Allergies  Allergies  Allergen Reactions  . Ciprofloxacin Nausea And Vomiting  . Ceftriaxone Rash  . Dilaudid [Hydromorphone Hcl] Anxiety and Other (See Comments)    Causes hallucinations, anxiety, and panic per patient  . Yellow Jacket Venom Swelling    Swelling at injection site  . Doxycycline   . Adhesive [Tape] Rash    Please use paper tape  . Brintellix [Vortioxetine] Itching  . Cymbalta [Duloxetine Hcl] Itching  . Penicillins Itching    Did it involve swelling of the face/tongue/throat, SOB, or low BP? No Did it involve sudden or severe rash/hives, skin peeling, or any reaction on the inside of your mouth or nose? No Did you need to seek medical attention at a hospital or doctor's office? No When did it last happen?54 yrs old If all above answers are "NO", may proceed with cephalosporin use.     Current Outpatient Medications  Medication Sig Dispense Refill  . acetaminophen (TYLENOL) 500 MG tablet Take 1,000 mg by mouth every 6 (six) hours as needed for moderate pain.    Marland Kitchen albuterol (PROVENTIL) (2.5 MG/3ML) 0.083% nebulizer solution Take 3 mLs (2.5 mg total) by nebulization every 4 (four) hours as needed for shortness of breath. 75 mL 12  . calcium acetate (PHOSLO) 667 MG capsule Take 2 capsules by mouth See admin instructions. Three times daily with meals and snacks    . cephALEXin (KEFLEX) 500 MG capsule Take 500 mg by mouth 4 (four) times daily.     . furosemide (LASIX) 80 MG tablet Take 80 mg by mouth See admin instructions. Take 80 mg daily on non dialysis days - Monday, Wednesday, Thursday, Friday, Sunday    . hydrALAZINE (APRESOLINE) 50 MG tablet Take 50 mg by mouth 2 (two) times daily.    Marland Kitchen lidocaine-prilocaine (EMLA) cream Apply 1 application topically as needed (numbing).     Marland Kitchen loratadine (CLARITIN) 10 MG tablet Take 10 mg by mouth daily as needed for allergies.    Marland Kitchen meclizine (ANTIVERT) 25 MG tablet Take 25 mg by mouth 4 (four) times daily as needed.    . metoprolol succinate (TOPROL-XL) 50 MG 24 hr tablet Take 50 mg by mouth daily. Take with  or immediately following a meal.    . midodrine (PROAMATINE) 5 MG tablet Take 5 mg by mouth daily as needed (for  low blood pressure.).     Marland Kitchen nitroGLYCERIN (NITROSTAT) 0.4 MG SL tablet Place 1 tablet (0.4 mg total) under the tongue every 5 (five) minutes x 3 doses as needed for chest pain (if no relief after 3rd dose, proceed to the ED for an evaluation or call 911). 25 tablet 3  . Oxycodone HCl 10 MG TABS Take 1 tablet (10 mg total) by mouth every 6 (six) hours as needed. 12 tablet 0  . PROAIR HFA 108 (90 Base) MCG/ACT inhaler Inhale 2 puffs into the lungs every 4 (four) hours as needed for shortness of breath.    . sertraline (ZOLOFT) 100 MG tablet Take 100 mg by mouth daily.    . isosorbide dinitrate (ISORDIL) 10 MG tablet Take 1 tablet (10 mg total) by mouth 2 (two) times daily. 180 tablet 3   No current facility-administered medications for this visit.   Facility-Administered Medications Ordered in Other Visits  Medication Dose Route Frequency Provider Last Rate Last Admin  . 0.9 %  sodium chloride infusion    Continuous PRN Neldon Newport, CRNA   New Bag at 07/18/19 0919    ROS:   General:  No weight loss, Fever, chills  HEENT: No recent headaches, no nasal bleeding, no visual changes, no sore throat  Neurologic: No dizziness, blackouts, seizures. No recent symptoms of stroke  or mini- stroke. No recent episodes of slurred speech, or temporary blindness.  Cardiac: No recent episodes of chest pain/pressure, no shortness of breath at rest.  No shortness of breath with exertion.  Denies history of atrial fibrillation or irregular heartbeat  Vascular: No history of rest pain in feet.  No history of claudication.  No history of non-healing ulcer, No history of DVT   Pulmonary: No home oxygen, no productive cough, no hemoptysis,  No asthma or wheezing  Musculoskeletal:  [ ]  Arthritis, [ ]  Low back pain,  [ ]  Joint pain  Hematologic:No history of hypercoagulable state.  No history of easy bleeding.  No history of anemia  Gastrointestinal: No hematochezia or melena,  No gastroesophageal reflux, no trouble swallowing  Urinary: [ ]  chronic Kidney disease, [x ] on HD - [ ]  MWF or [ ]  TTHS, [ ]  Burning with urination, [ ]  Frequent urination, [ ]  Difficulty urinating;   Skin: No rashes  Psychological: No history of anxiety,  No history of depression   Physical Examination  Vitals:   05/22/20 0839  BP: 106/70  Pulse: 83  Resp: 20  Temp: 98 F (36.7 C)  TempSrc: Temporal  SpO2: 95%  Weight: 186 lb 9.6 oz (84.6 kg)  Height: 5\' 5"  (1.651 m)    Body mass index is 31.05 kg/m.  General:  Alert and oriented, no acute distress HEENT: Normal Neck: No bruit or JVD Pulmonary: Clear to auscultation bilaterally Cardiac: Regular Rate and Rhythm without murmur Gastrointestinal: Soft, non-tender, non-distended, no mass, no scars Skin: No rash Extremity Pulses:  2+ radial, brachial pulses bilaterally, left palpable thrill in fistula Musculoskeletal: left UE with visible venous branches and edema.  Neurologic: Upper and lower extremity motor 5/5 and symmetric  DATA:     Findings:    +-------------+----------------------+-----------------+-------------------  ----+          PPG w/o compression PPG w compression    Comments        +-------------+----------------------+-----------------+-------------------  ----+  Right Digit 2  normal waveform/                                  pressure 166mmHG                         +-------------+----------------------+-----------------+-------------------  ----+  Left Digit 2  abnormal waveform/ normal waveform/ Waveform  normalizes and           pressure 151mmHg  pressure 150mmHg pressure increases  with                               AVF  compression.    +-------------+----------------------+-----------------+-------------------  ----+     Summary:  Left 2nd digit waveform and pressure improves with AVF compression  consistent  with steal.   ASSESSMENT:   Left AV fistula unable to successfully use It has been infiltrated x 2 and she is still relying on a left TDC for TTh HD only 2 days a week she is still producing urine.    She has mild weakness in her left hand, but grip is equal on exam.  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  She has a palpable radial pulse.    PLAN:  She has been scheduled for fistulogram and possible intervention.  In the mean time she will continue to use the left UE to improve strength and the left TDC that was recently exchanged by [facility].    Roxy Horseman PA-C [facility] [place] Office: [phone]  MD in clinic [place]

## 2020-05-25 DIAGNOSIS — Z992 Dependence on renal dialysis: Secondary | ICD-10-CM | POA: Diagnosis not present

## 2020-05-25 DIAGNOSIS — N186 End stage renal disease: Secondary | ICD-10-CM | POA: Diagnosis not present

## 2020-05-28 DIAGNOSIS — N186 End stage renal disease: Secondary | ICD-10-CM | POA: Diagnosis not present

## 2020-05-28 DIAGNOSIS — Z992 Dependence on renal dialysis: Secondary | ICD-10-CM | POA: Diagnosis not present

## 2020-06-04 DIAGNOSIS — Z992 Dependence on renal dialysis: Secondary | ICD-10-CM | POA: Diagnosis not present

## 2020-06-04 DIAGNOSIS — N186 End stage renal disease: Secondary | ICD-10-CM | POA: Diagnosis not present

## 2020-06-04 DIAGNOSIS — E8779 Other fluid overload: Secondary | ICD-10-CM | POA: Diagnosis not present

## 2020-06-04 DIAGNOSIS — N2581 Secondary hyperparathyroidism of renal origin: Secondary | ICD-10-CM | POA: Diagnosis not present

## 2020-06-06 DIAGNOSIS — Z992 Dependence on renal dialysis: Secondary | ICD-10-CM | POA: Diagnosis not present

## 2020-06-06 DIAGNOSIS — E8779 Other fluid overload: Secondary | ICD-10-CM | POA: Diagnosis not present

## 2020-06-06 DIAGNOSIS — N186 End stage renal disease: Secondary | ICD-10-CM | POA: Diagnosis not present

## 2020-06-07 ENCOUNTER — Other Ambulatory Visit: Payer: Self-pay

## 2020-06-07 IMAGING — CT CT RENAL STONE PROTOCOL
2 of 4 series · 16 of 46 positions shown, 18 images · non-contrast
Comparison: CT scan February 12, 2018.

CLINICAL DATA: Dysuria. Recent diagnosis of UTI. Fever. Vomiting.

EXAM:
CT ABDOMEN AND PELVIS WITHOUT CONTRAST
TECHNIQUE: Multidetector CT imaging of the abdomen and pelvis was performed
following the standard protocol without IV contrast.

[Series 2: axial st · axial · 0.78mm/px · z∈[+869,+1304]mm · 13 of 95 slices shown, 15 images]
[im 4/95  soft-tissue]
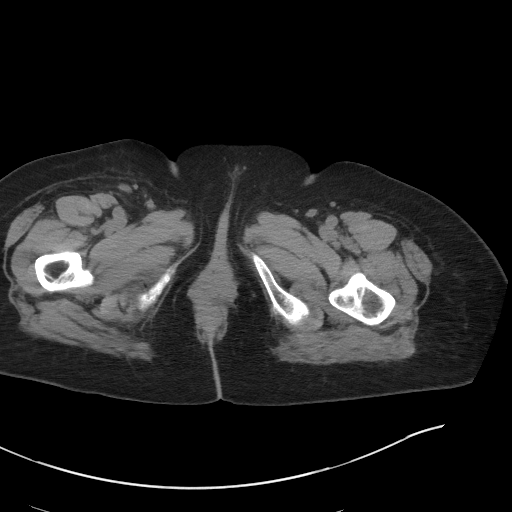
[im 4/95  bone]
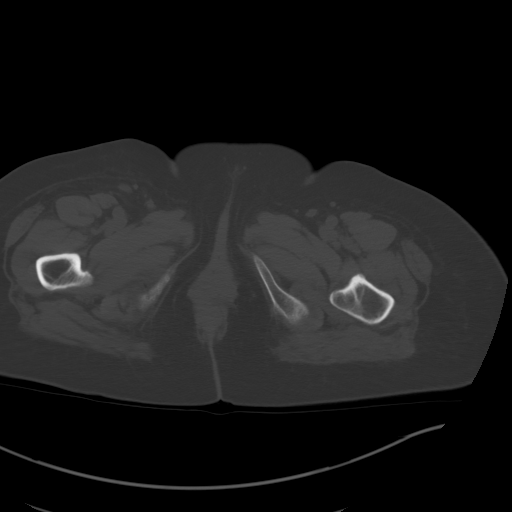
[im 12/95  soft-tissue]
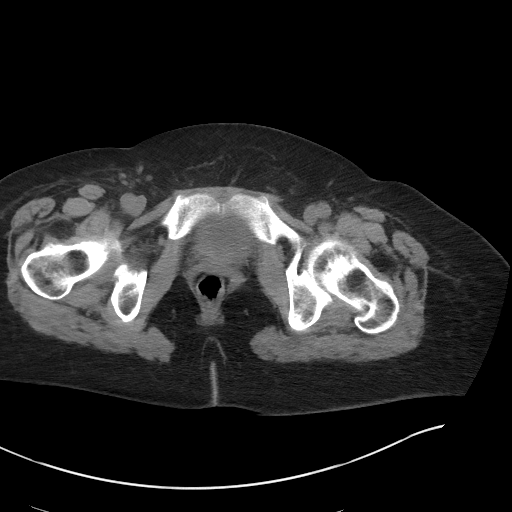
[im 20/95  soft-tissue]
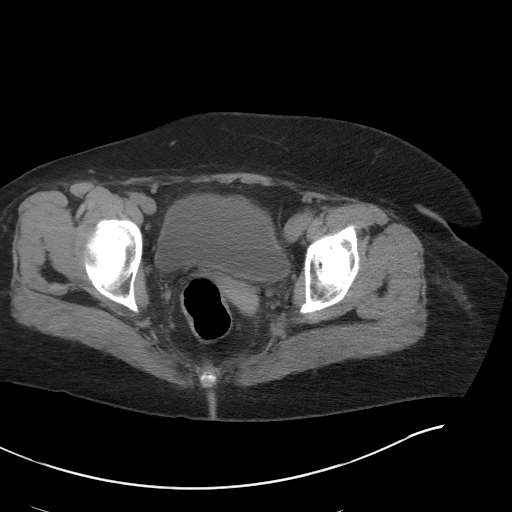
[im 28/95  soft-tissue]
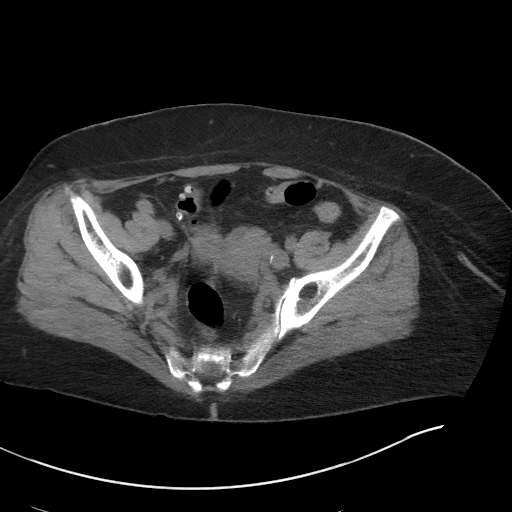
[im 32/95  soft-tissue]
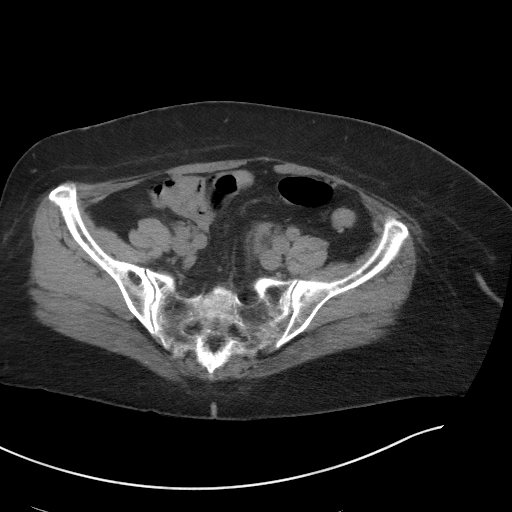
[im 40/95  soft-tissue]
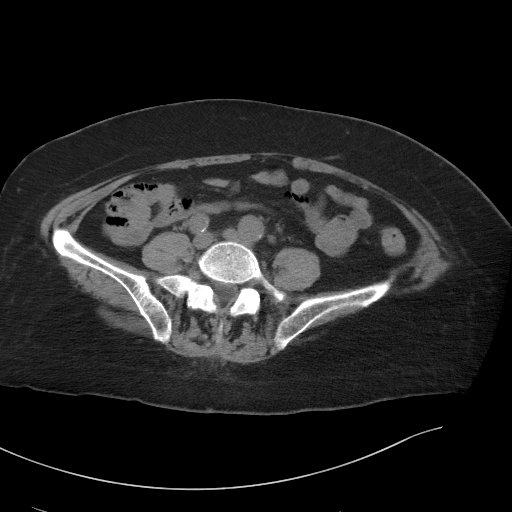
[im 48/95  soft-tissue]
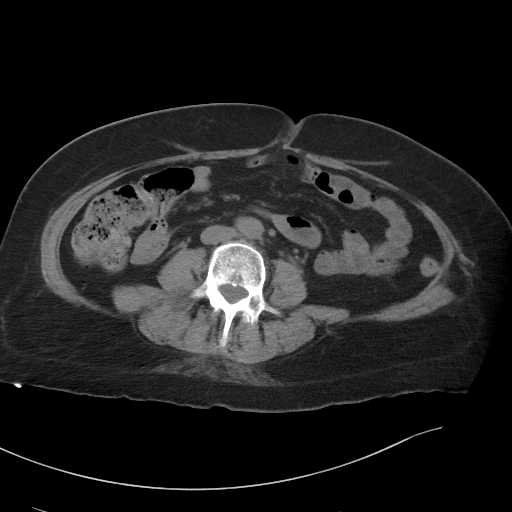
[im 55/95  soft-tissue]
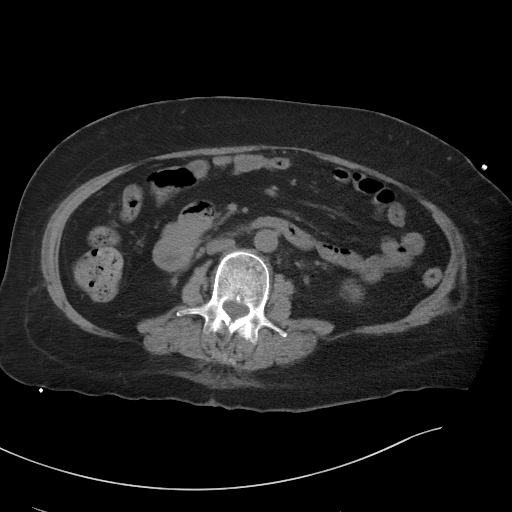
[im 63/95  soft-tissue]
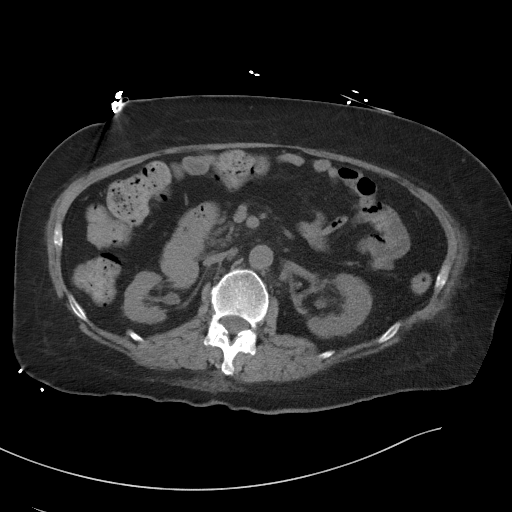
[im 63/95  bone]
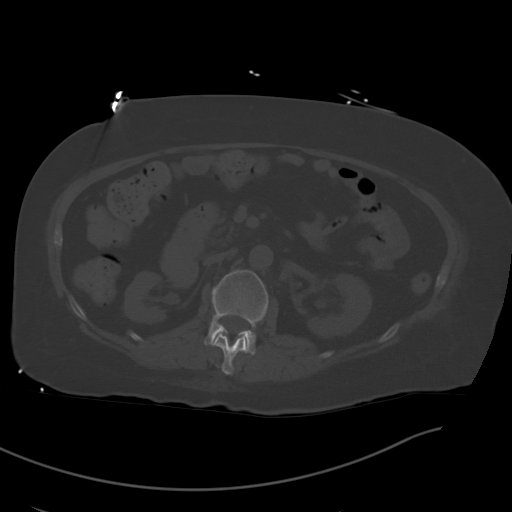
[im 67/95  soft-tissue]
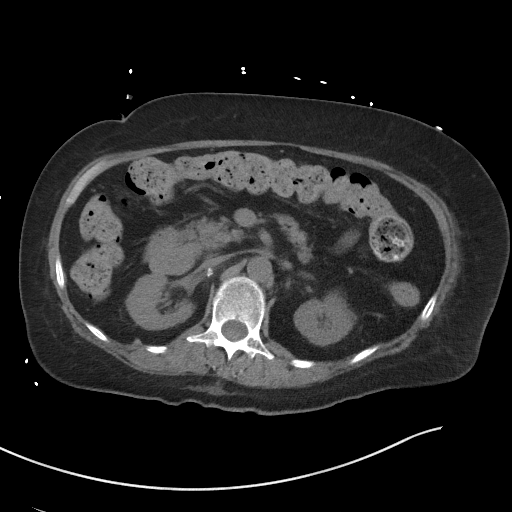
[im 75/95  soft-tissue]
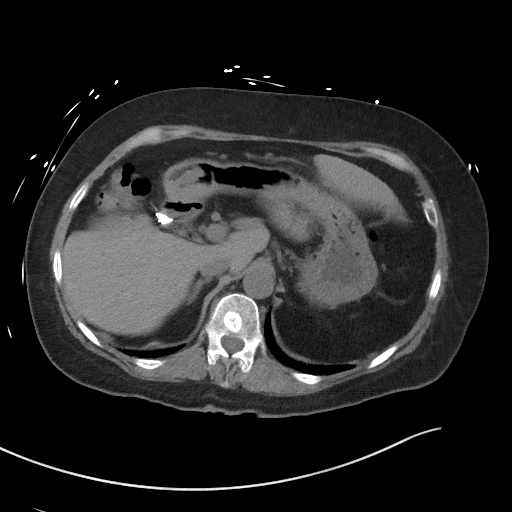
[im 83/95  soft-tissue]
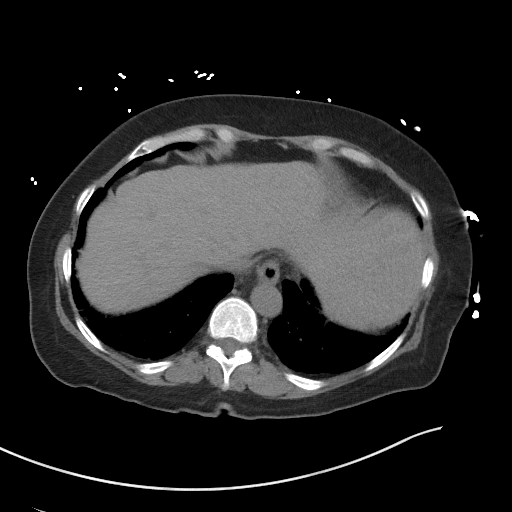
[im 91/95  soft-tissue]
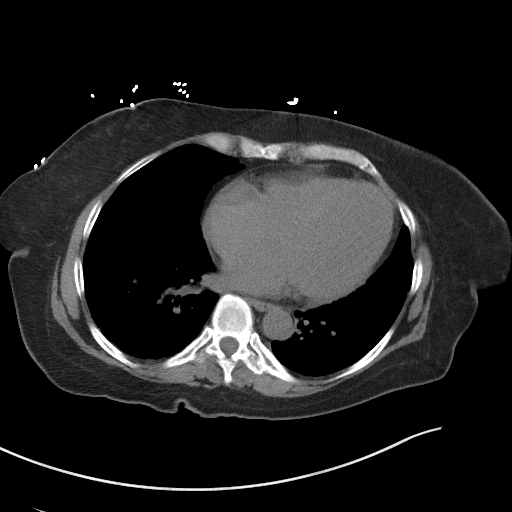

[Series 5: coronal st · coronal · 0.79mm/px · 3 of 110 slices shown]
[im 37/110  soft-tissue]
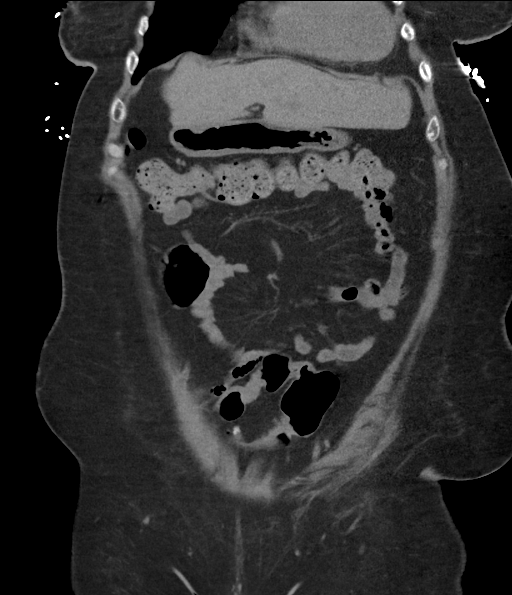
[im 49/110  soft-tissue]
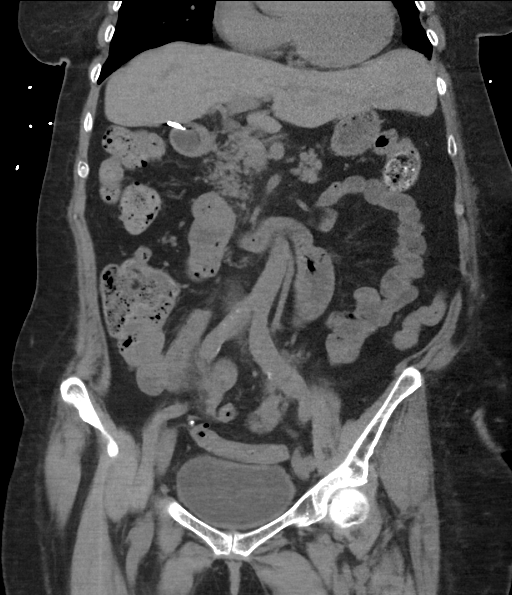
[im 61/110  soft-tissue]
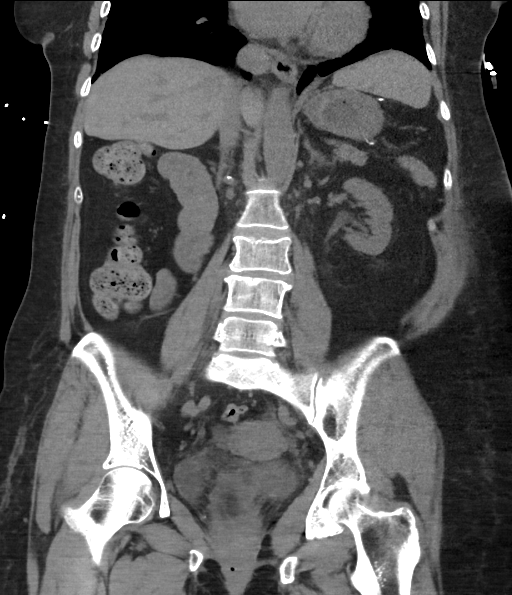

[16 of 46 positions shown; findings below may reference images not displayed]

FINDINGS: Lower chest: Bilateral dependent atelectasis. The lower chest is
otherwise unremarkable.

Hepatobiliary: Previous cholecystectomy. The left hepatic lobe is
hypertrophied but stable. No liver masses are noted.

Pancreas: Unremarkable. No pancreatic ductal dilatation or
surrounding inflammatory changes.

Spleen: Previous splenectomy.

Adrenals/Urinary Tract: Adrenal glands are normal. No renal stones,
masses, or hydronephrosis. No ureteral stones. The bladder is
unremarkable.

Stomach/Bowel: The stomach and small bowel are normal. Colonic
diverticulosis is seen without diverticulitis. The remainder of the
colon is normal. The visualized appendix is unremarkable.

Vascular/Lymphatic: The abdominal aorta is unremarkable. There is
aneurysmal dilatation of the right common iliac artery measuring
cm, unchanged. No adenopathy.

Reproductive: Uterus and bilateral adnexa are unremarkable.

Other: No abdominal wall hernia or abnormality. No abdominopelvic
ascites.

Musculoskeletal: Anterior wedging of T10 and T12 is stable. No
changes in the visualized bones.
IMPRESSION: 1. No cause for the patient's symptoms identified.
2. Colonic diverticulosis without diverticulitis.
3. Aneurysmal dilatation of the right common iliac artery measuring
1.8 cm, stable since February 12, 2018.

## 2020-06-07 MED ORDER — SODIUM CHLORIDE 0.9 % IV SOLN
250.0000 mL | INTRAVENOUS | Status: AC | PRN
Start: 2020-06-07 — End: ?

## 2020-06-08 DIAGNOSIS — E8779 Other fluid overload: Secondary | ICD-10-CM | POA: Diagnosis not present

## 2020-06-08 DIAGNOSIS — Z992 Dependence on renal dialysis: Secondary | ICD-10-CM | POA: Diagnosis not present

## 2020-06-08 DIAGNOSIS — N186 End stage renal disease: Secondary | ICD-10-CM | POA: Diagnosis not present

## 2020-06-13 DIAGNOSIS — E8779 Other fluid overload: Secondary | ICD-10-CM | POA: Diagnosis not present

## 2020-06-13 DIAGNOSIS — N186 End stage renal disease: Secondary | ICD-10-CM | POA: Diagnosis not present

## 2020-06-13 DIAGNOSIS — Z992 Dependence on renal dialysis: Secondary | ICD-10-CM | POA: Diagnosis not present

## 2020-06-13 IMAGING — CT CT CHEST WITHOUT CONTRAST
3 of 7 series · 15 of 36 positions shown, 17 images · non-contrast
Comparison: Chest radiograph performed concurrently. Abdominal CT
12/31/2018

CLINICAL DATA: Pyelonephritis, complicated; Acute resp illness, >
40 years old Cough, persistent. Left flank pain. Patient reports
renal biopsy 3 days ago. Fever, shortness of breath and flu-like
symptoms.

EXAM:
CT CHEST, ABDOMEN AND PELVIS WITHOUT CONTRAST
TECHNIQUE: Multidetector CT imaging of the chest, abdomen and pelvis was
performed following the standard protocol without IV contrast.

[Series 2: axial st · axial · 0.87mm/px · z∈[+812,+1147]mm · 7 of 91 slices shown, 9 images]
[im 12/91  mediastinal]
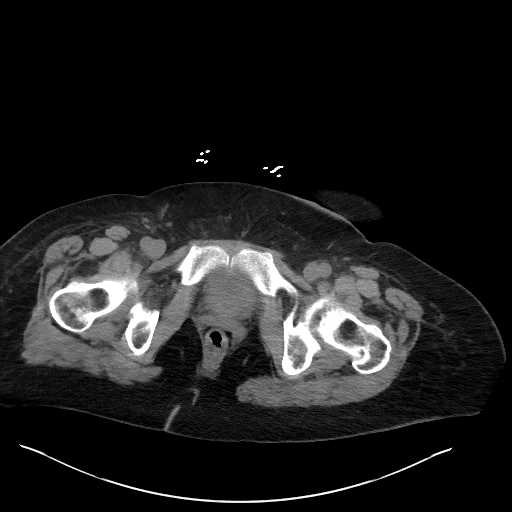
[im 12/91  lung]
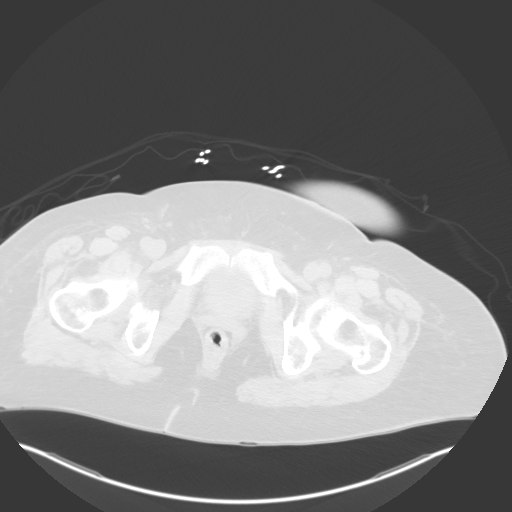
[im 23/91  lung]
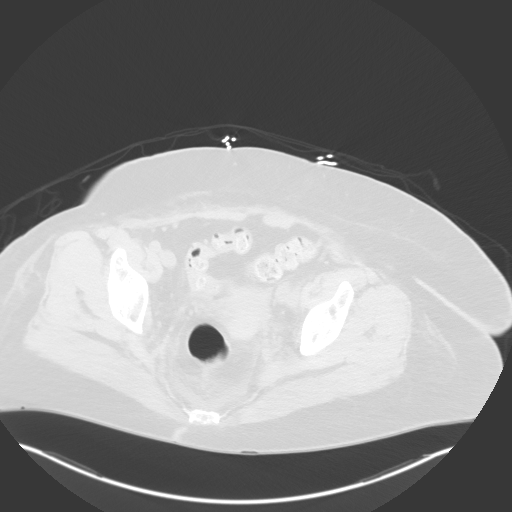
[im 34/91  lung]
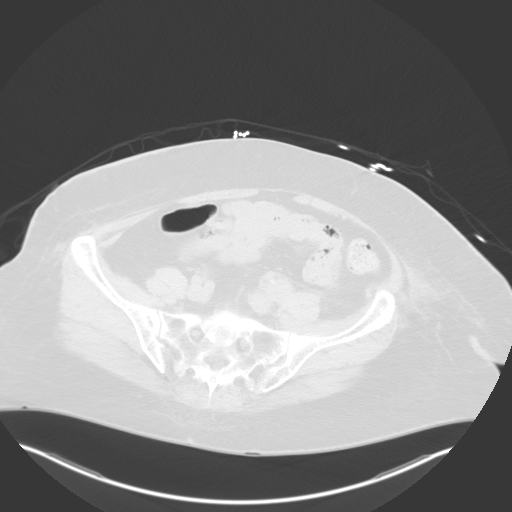
[im 46/91  lung]
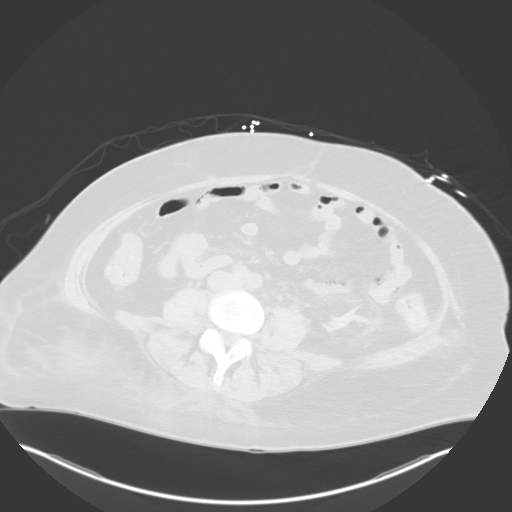
[im 57/91  mediastinal]
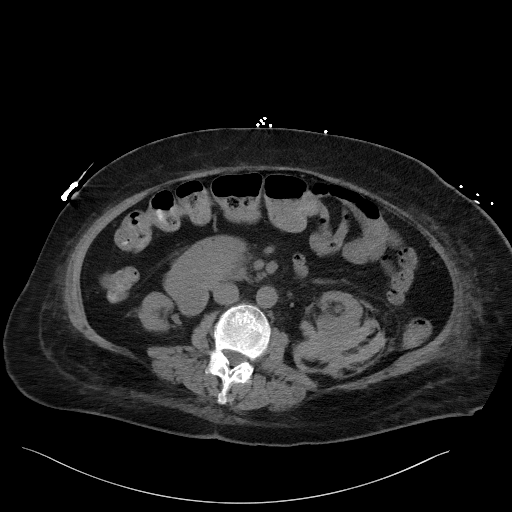
[im 57/91  lung]
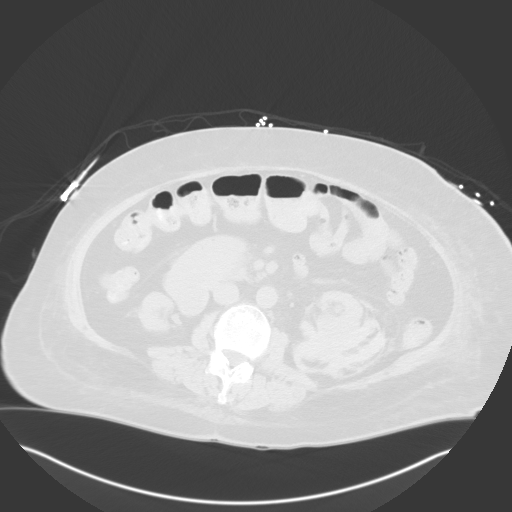
[im 68/91  lung]
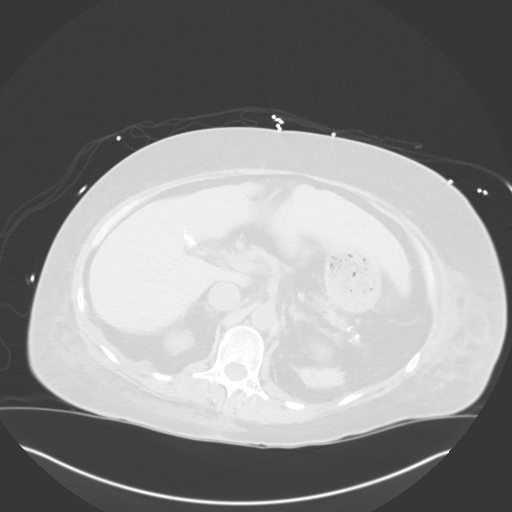
[im 79/91  lung]
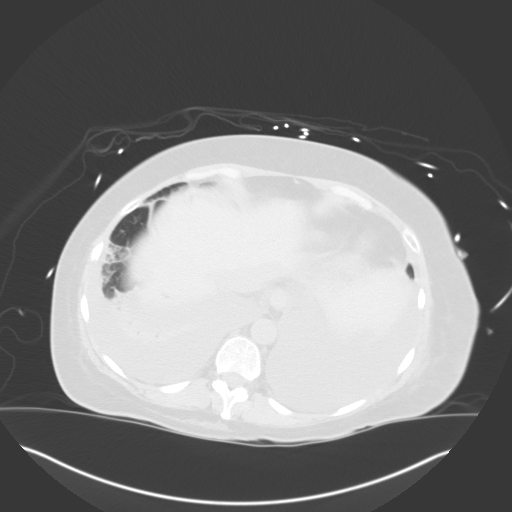

[Series 5: coronal st · coronal · 0.92mm/px · 1 of 110 slices shown]
[im 55/110  lung]
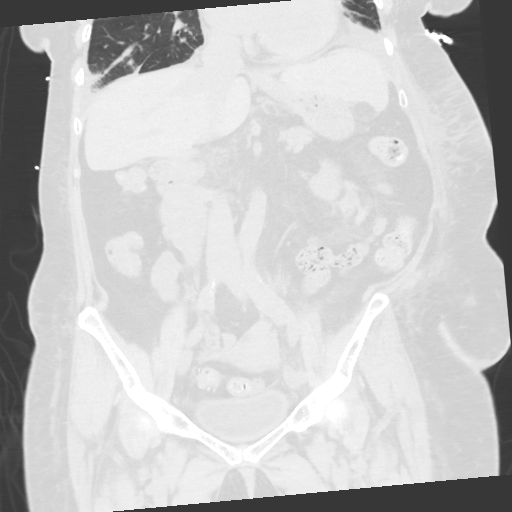

[Series 7: thorax · axial · 0.74mm/px · z∈[+1105,+1333]mm · 7 of 156 slices shown]
[im 11/156  lung]
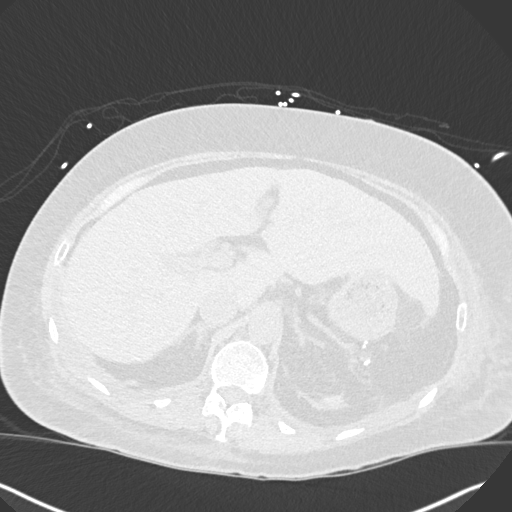
[im 32/156  lung]
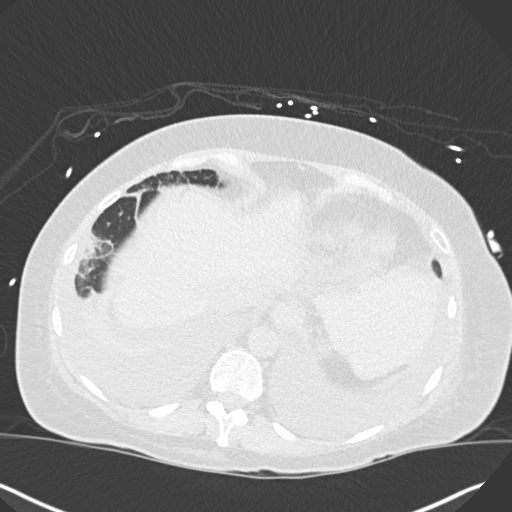
[im 52/156  lung]
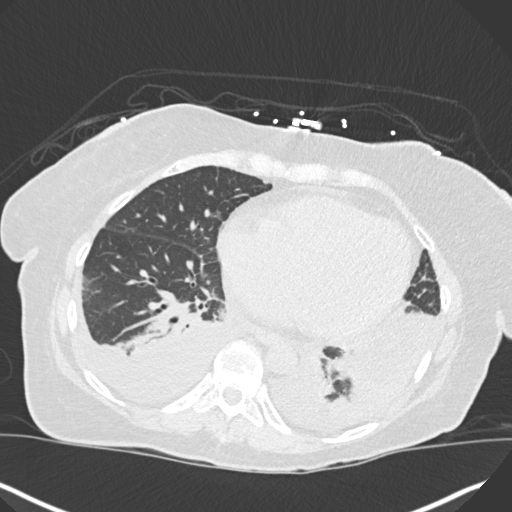
[im 73/156  lung]
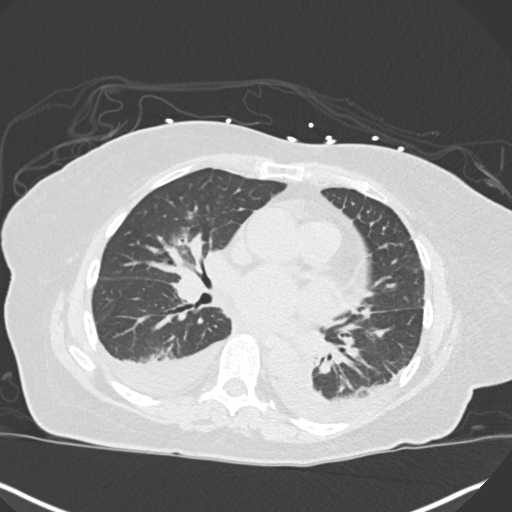
[im 83/156  lung]
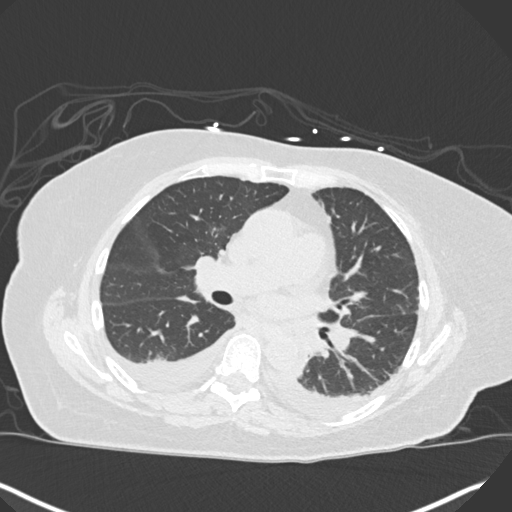
[im 104/156  lung]
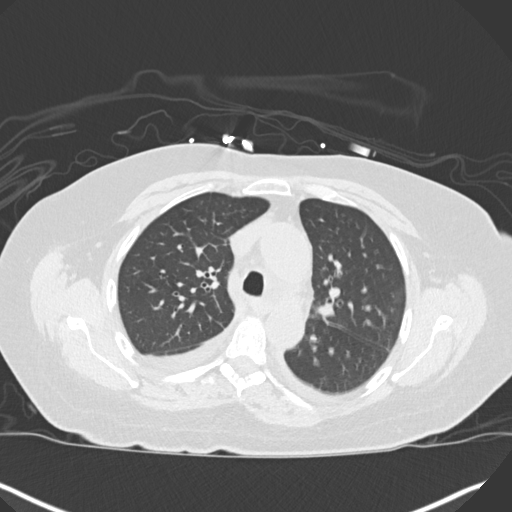
[im 125/156  lung]
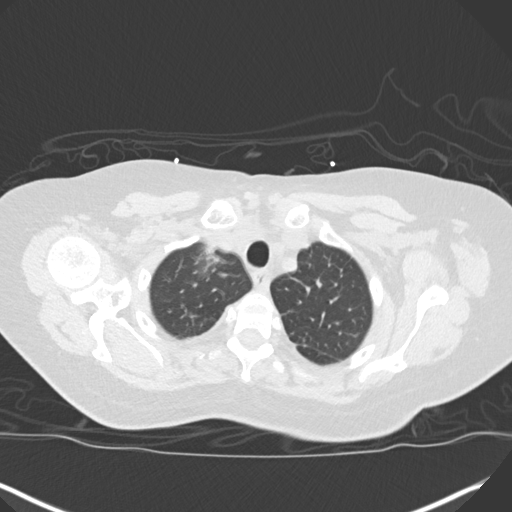

[15 of 36 positions shown; findings below may reference images not displayed]

FINDINGS: CT CHEST FINDINGS

Cardiovascular: Mild multi chamber cardiomegaly. Trace pericardial
fluid. Thoracic aorta is normal in caliber.

Mediastinum/Nodes: Multiple prominent mediastinal nodes. Limited
assessment for hilar adenopathy given lack of IV contrast. Esophagus
is decompressed. No visualized thyroid nodule.

Lungs/Pleura: Small to moderate bilateral pleural effusions.
Associated compressive atelectasis. There is smooth septal
thickening consistent with pulmonary edema. Multifocal patchy
ground-glass opacities, asymmetric throughout both lungs. Trachea
mainstem bronchi are patent.

Musculoskeletal: Chronic mild T10 superior endplate compression
fracture. There are no acute or suspicious osseous abnormalities.

CT ABDOMEN PELVIS FINDINGS

Hepatobiliary: Elongated left lobe of the liver. No focal hepatic
lesion on noncontrast exam. Insert:

Pancreas: No ductal dilatation or inflammation.

Spleen: Splenectomy.

Adrenals/Urinary Tract: Normal adrenal glands. High-density left
perinephric collection spanning 2.9 cm in depth and 9.8 cm in length
most consistent with hematoma in the setting of recent kidney
biopsy. Small foci of gas within the collection inferiorly. No
evidence of intrarenal fluid collection. No significant mass effect
on the left kidney. No hydronephrosis. No right hydronephrosis. Both
ureters are decompressed. Urinary bladder is minimally distended.
Questionable bladder wall thickening.

Stomach/Bowel: Bowel evaluation is limited in the absence of enteric
contrast. Ingested material in the stomach. No evidence of bowel
wall thickening, inflammatory change, or obstruction. The duodenal
jejunal junction is not well-defined on this noncontrast exam.
Colonic diverticulosis without diverticulitis. Normal appendix
tentatively identified.

Vascular/Lymphatic: Normal caliber thoracic aorta. Tab attic left
common iliac artery at 19 mm, unchanged allowing for differences in
caliper placement. No definite enlarged lymph nodes in the abdomen
or pelvis.

Reproductive: Uterus and bilateral adnexa are unremarkable.

Other: Subcutaneous flank soft tissue edema, left greater than
right. Small left lateral lumbar hernia contains only fat,
unchanged. No ascites or hemoperitoneum. Minimal edema in the
presacral space.

Musculoskeletal: Prominent Schmorl's node loss of height T12,
unchanged. Degenerative change in the lumbar spine. Avascular
necrosis of both femoral heads without collapse.
IMPRESSION: 1. Cardiomegaly with bilateral pleural effusions. Smooth septal
thickening consistent with pulmonary edema.
2. There are patchy bilateral ground-glass opacities in both lungs
that are nonspecific in the setting, this may represent alveolar
edema infectious or inflammatory etiologies. Imaging features are
atypical or on commonly reported with OBIAZ-WX or viral pneumonia.
Alternative diagnosis should be considered.
3. Recent left renal biopsy with moderate-sized left extrarenal
perinephric hematoma. Small foci of air within the hematoma is
likely related to recent biopsy, however raises the possibility of
superimposed infection.
4. Colonic diverticulosis without diverticulitis.

## 2020-06-13 IMAGING — CR PORTABLE CHEST - 1 VIEW
1 series · 1 of 1 positions shown · non-contrast
Comparison: Radiograph 06/29/2018.

CLINICAL DATA: Fever.

EXAM:
PORTABLE CHEST 1 VIEW

[portable]
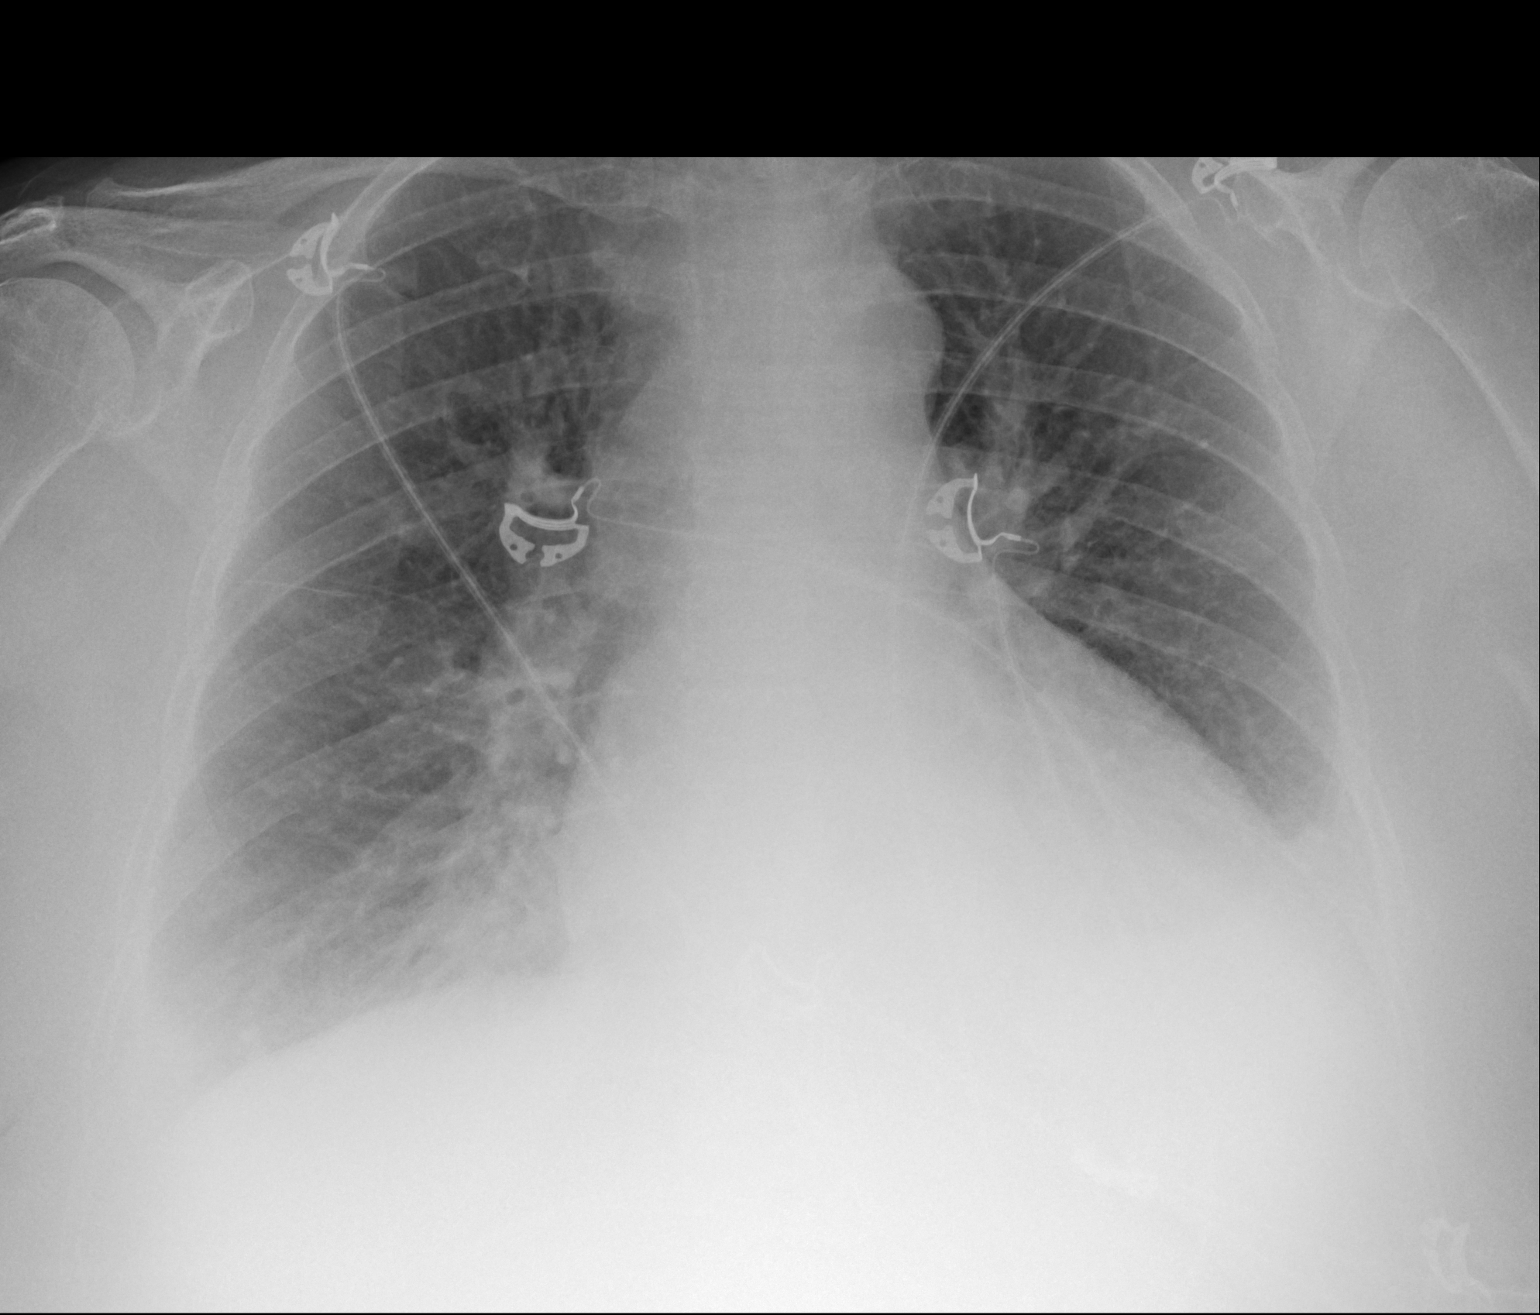

[1 of 1 positions shown; findings below may reference images not displayed]

FINDINGS: Cardiomegaly, new from prior exam. Bilateral pleural effusions, also
new. Vascular congestion without overt pulmonary edema. Bibasilar
opacities associated with pleural effusions typically atelectasis.
No pneumothorax.
IMPRESSION: Cardiomegaly with vascular congestion and small pleural effusions.
Findings consistent with CHF.

## 2020-06-15 DIAGNOSIS — N186 End stage renal disease: Secondary | ICD-10-CM | POA: Diagnosis not present

## 2020-06-15 DIAGNOSIS — Z992 Dependence on renal dialysis: Secondary | ICD-10-CM | POA: Diagnosis not present

## 2020-06-15 DIAGNOSIS — E8779 Other fluid overload: Secondary | ICD-10-CM | POA: Diagnosis not present

## 2020-06-15 IMAGING — DX PORTABLE CHEST - 1 VIEW
1 series · 1 of 1 positions shown · non-contrast
Comparison: CT chest and chest x-ray 01/06/2019 and earlier.

CLINICAL DATA: Cough, chest congestion and shortness of breath.
Follow-up CHF versus pneumonia.

EXAM:
PORTABLE CHEST 1 VIEW

[chest ap]
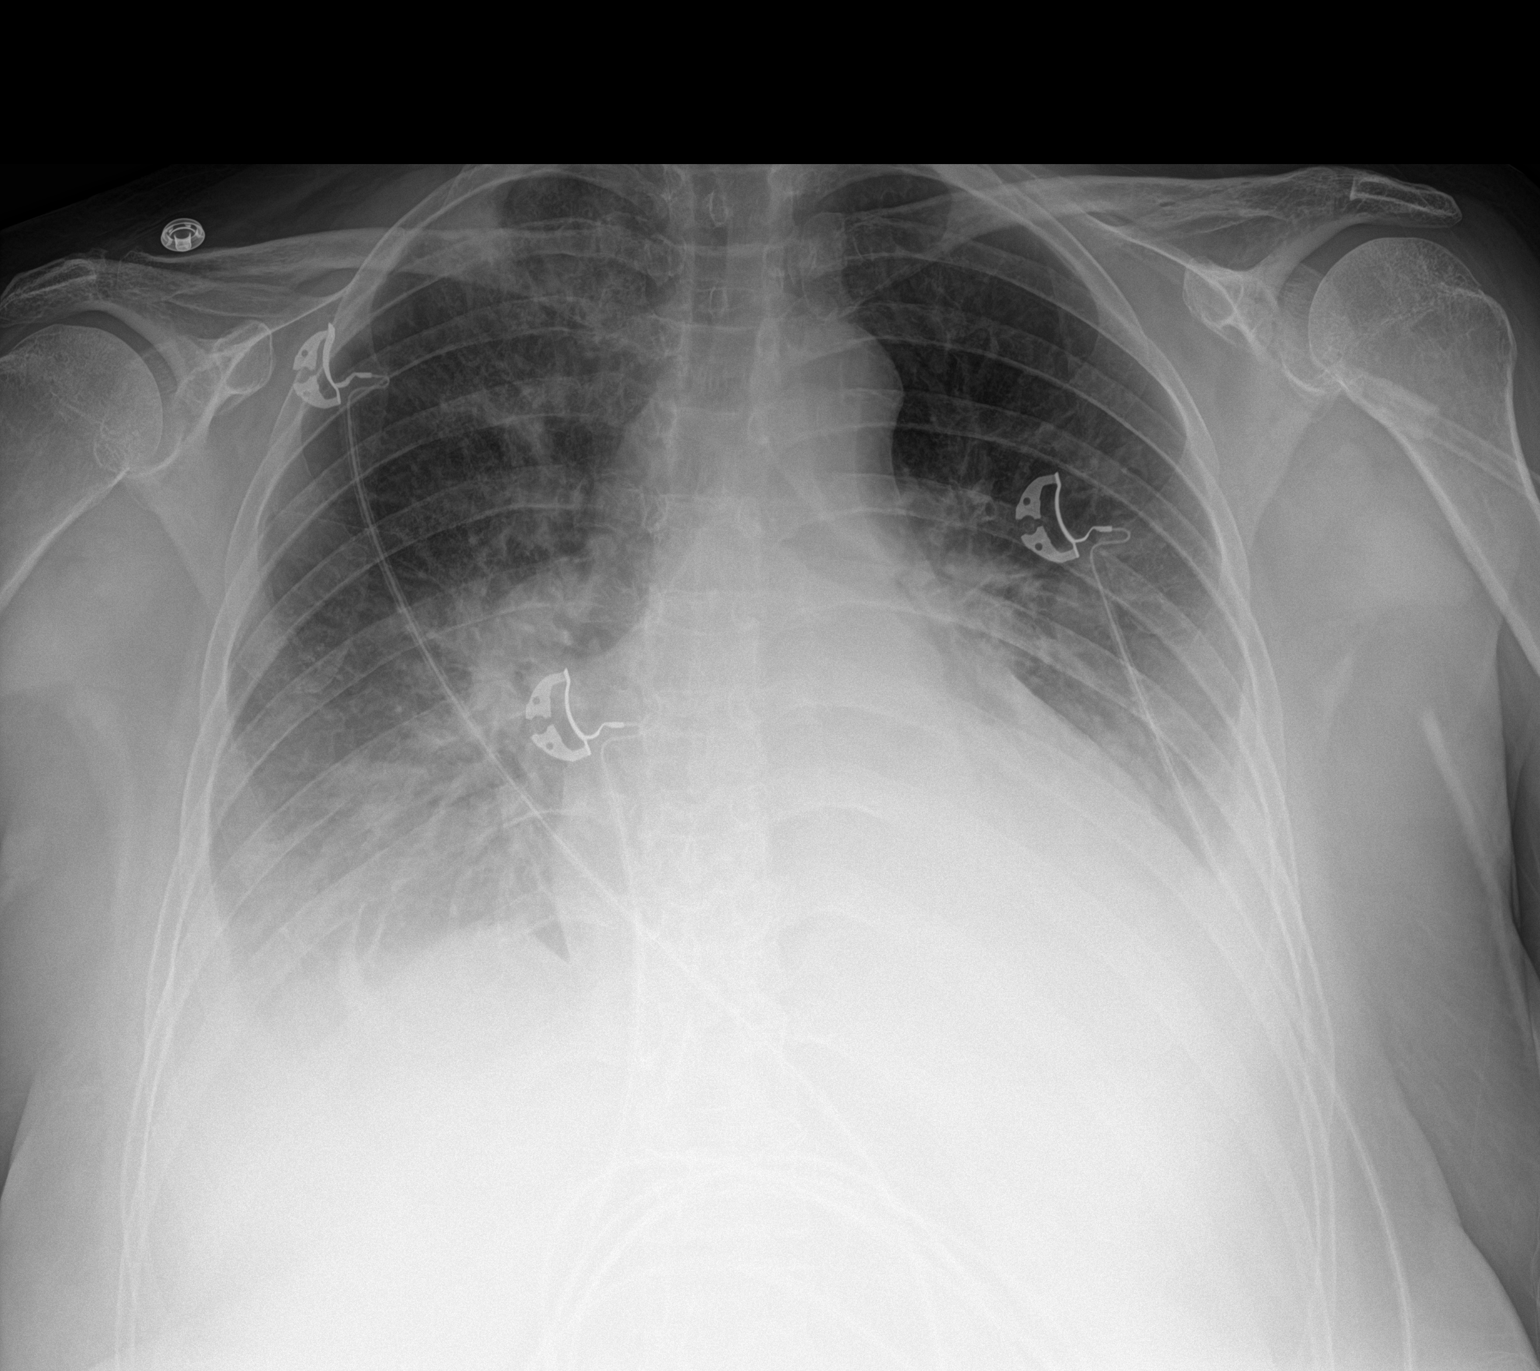

[1 of 1 positions shown; findings below may reference images not displayed]

FINDINGS: Cardiac silhouette mildly enlarged, unchanged. Pulmonary vascularity
now normal. Developing patchy airspace opacities in the RIGHT UPPER
LOBE. Moderate to large BILATERAL pleural effusions, increased in
size on the RIGHT and unchanged on the LEFT, with associated dense
consolidation in the lower lobes.
IMPRESSION: 1. Developing pneumonia involving the RIGHT upper lobe.
2. Moderate to large BILATERAL pleural effusions, increased in size
on the RIGHT and unchanged on the LEFT. Associated dense BILATERAL
lower lobe atelectasis and/or pneumonia.
3. Stable mild cardiomegaly without pulmonary edema currently as the
pulmonary vascularity is now normal.

## 2020-06-16 IMAGING — DX PORTABLE CHEST - 1 VIEW
1 series · 1 of 1 positions shown · non-contrast
Comparison: Chest x-ray from yesterday.

CLINICAL DATA: Cough and shortness of breath.

EXAM:
PORTABLE CHEST 1 VIEW

[chest]
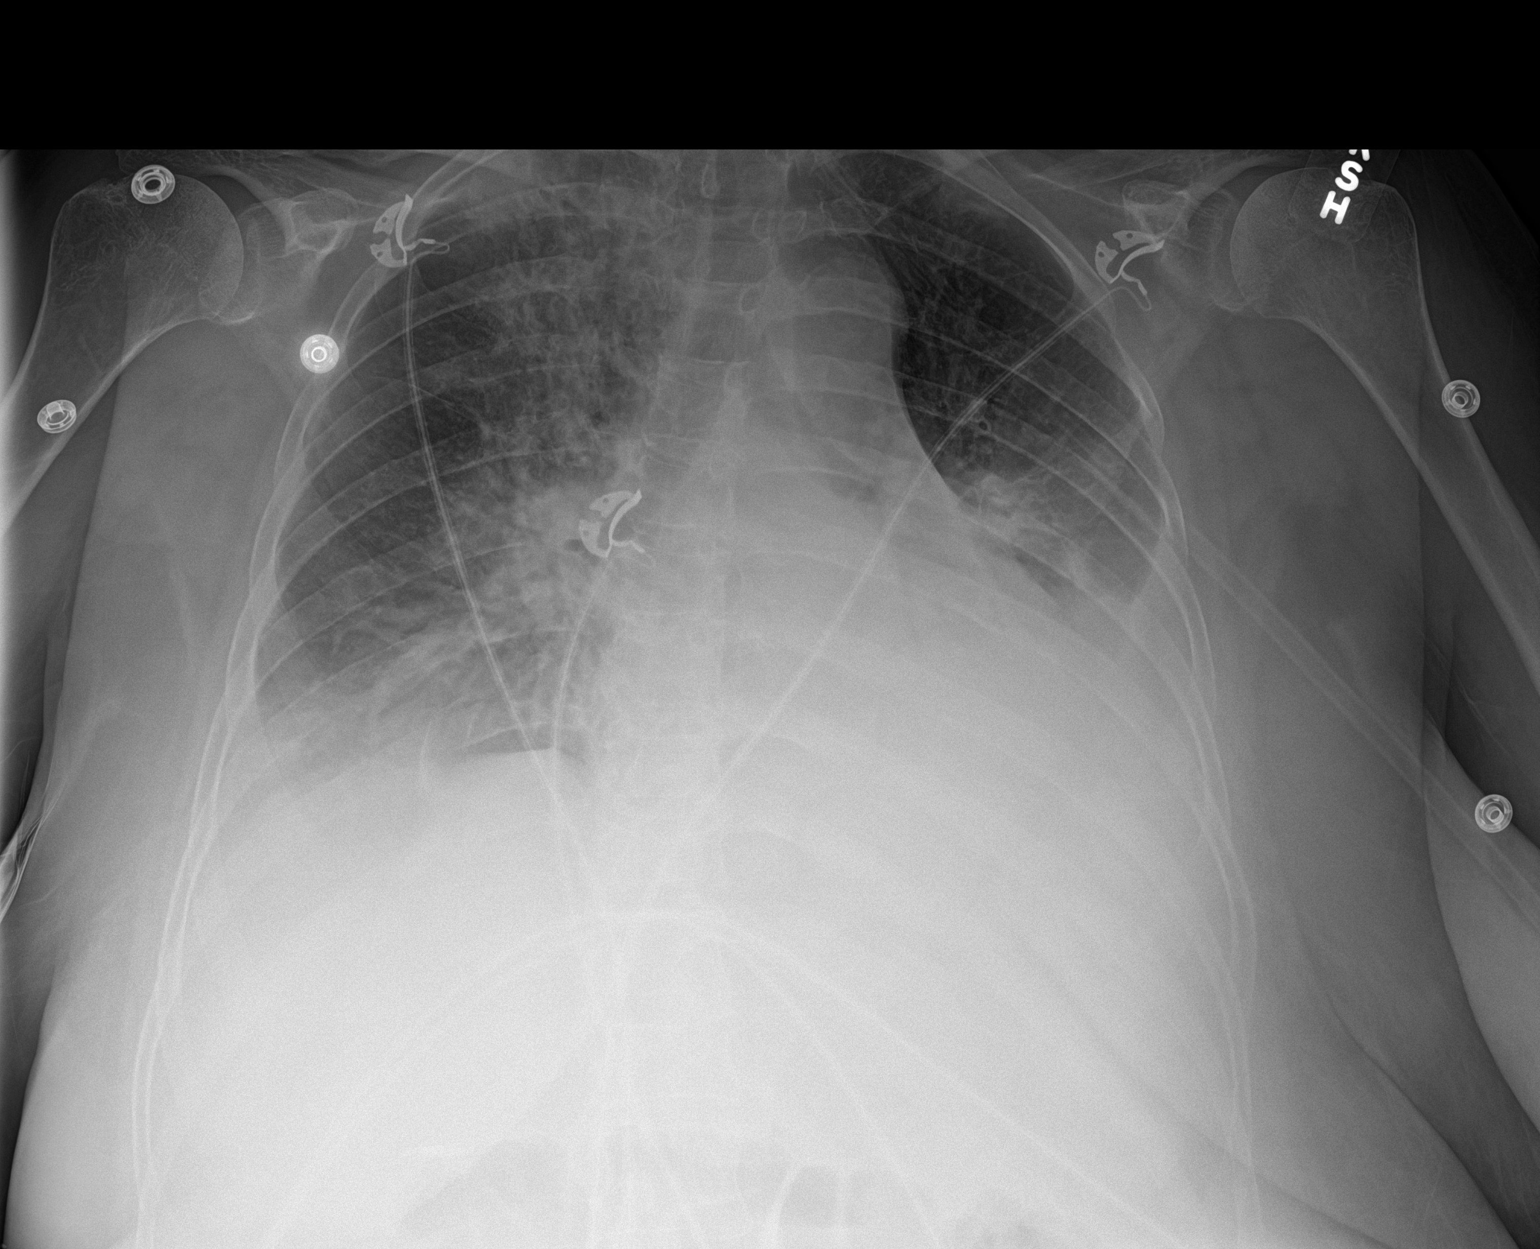

[1 of 1 positions shown; findings below may reference images not displayed]

FINDINGS: Stable cardiomediastinal silhouette. Normal pulmonary vascularity.
Worsening patchy opacity in the right upper lobe. Unchanged moderate
left and small right pleural effusions with left greater than right
lower lobe atelectasis/consolidation. No pneumothorax. No acute
osseous abnormality.
IMPRESSION: 1. Worsening right upper lobe pneumonia.
2. Unchanged moderate left and small right pleural effusions with
left greater than right lower lobe atelectasis/infiltrate.

## 2020-06-16 IMAGING — US US RENAL
1 series · 14 of 25 positions shown · non-contrast
Comparison: CT 01/06/2019, ultrasound 01/04/2019

CLINICAL DATA: 53-year-old female with a history of medical renal
disease. Prior biopsy 01/03/2019

EXAM:
RENAL / URINARY TRACT ULTRASOUND COMPLETE

[Series 1: us renal · 14 of 32 slices shown]
[im 1/32]
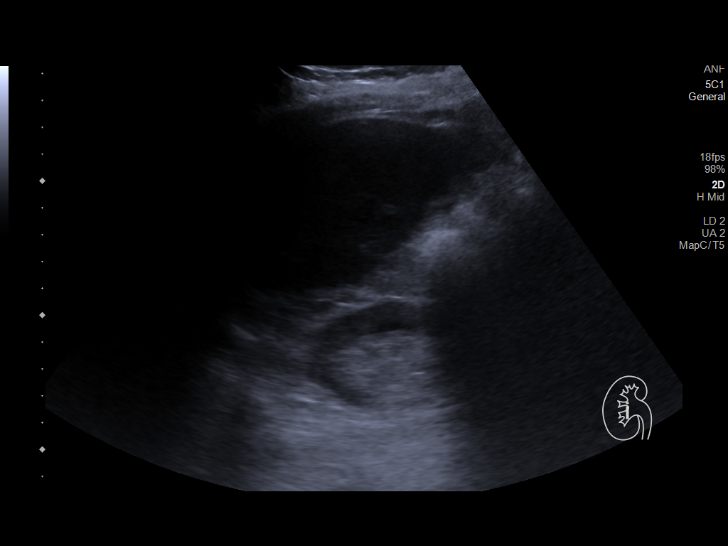
[im 3/32]
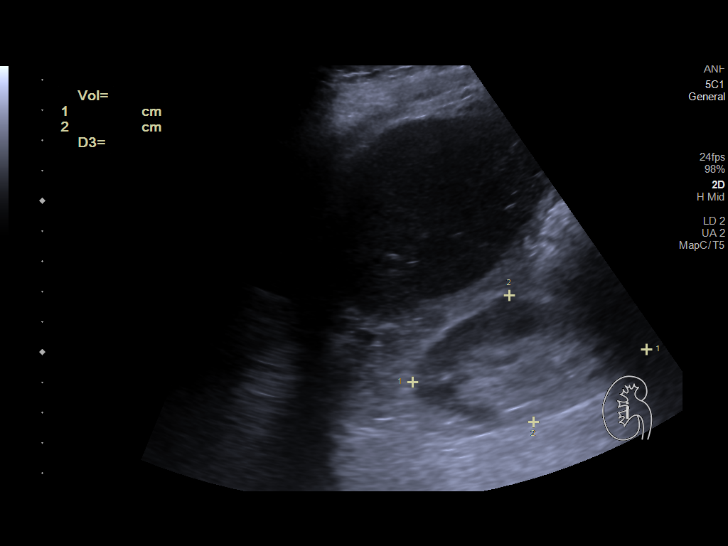
[im 6/32]
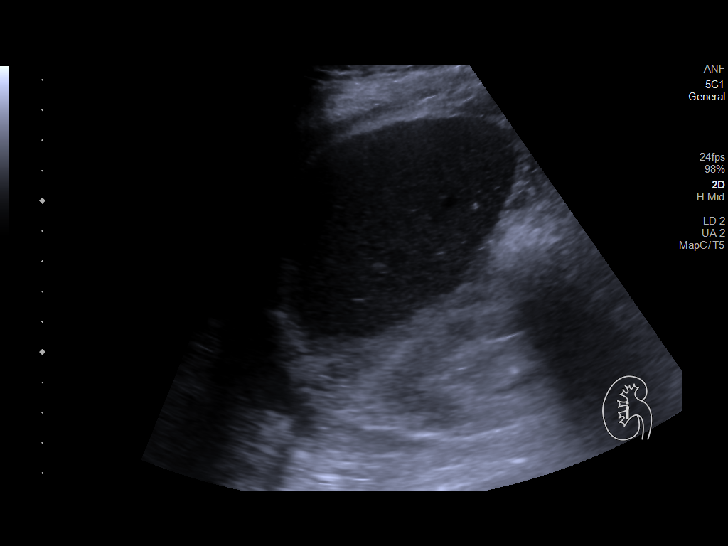
[im 8/32]
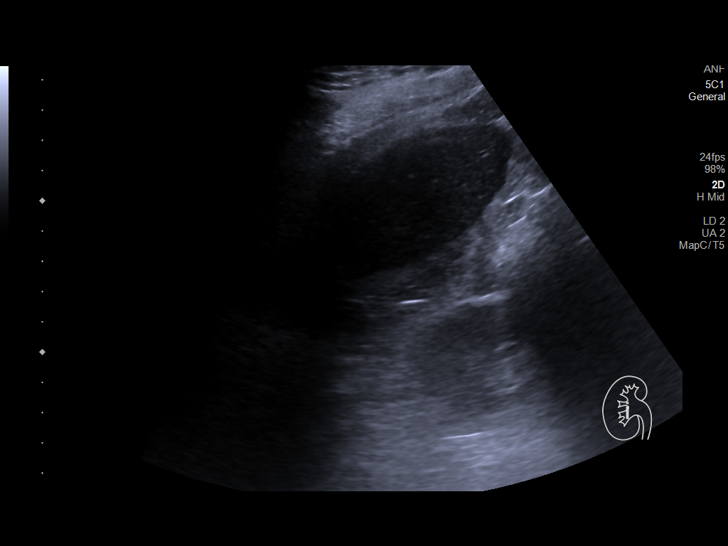
[im 11/32]
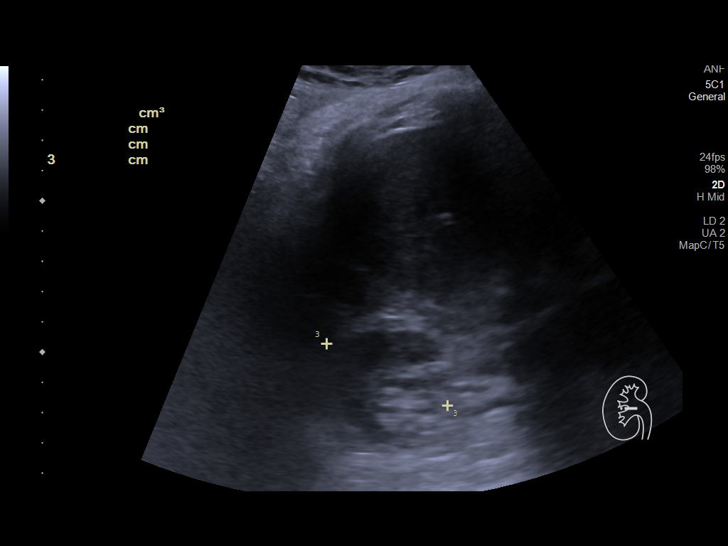
[im 12/32]
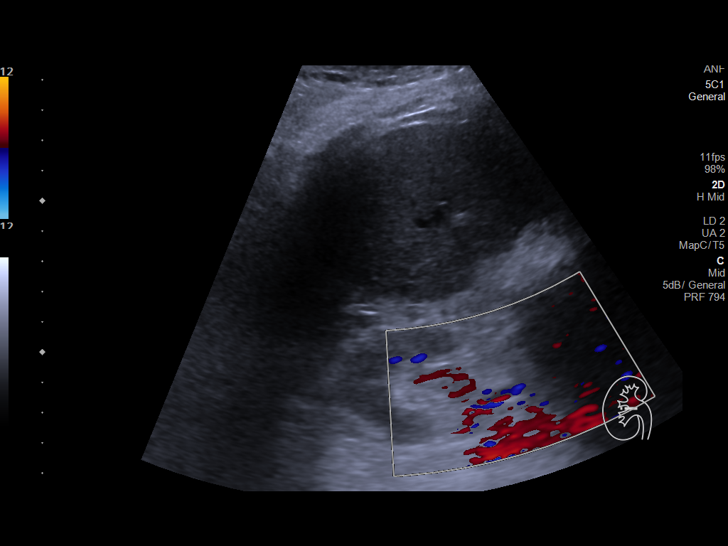
[im 15/32]
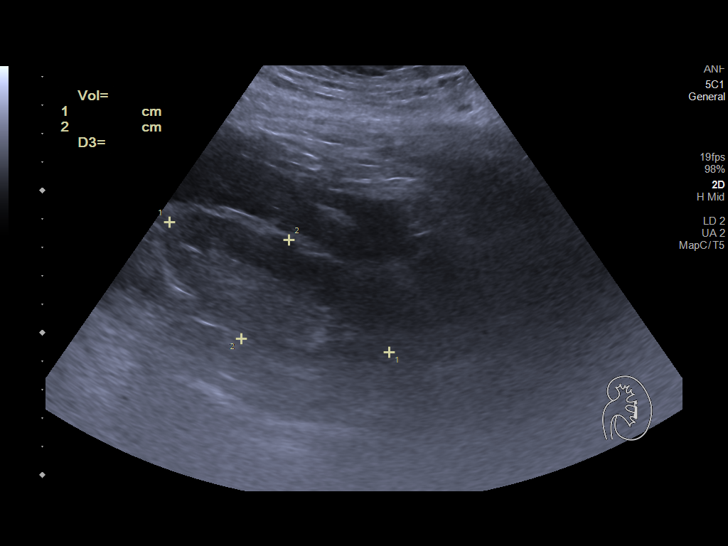
[im 17/32]
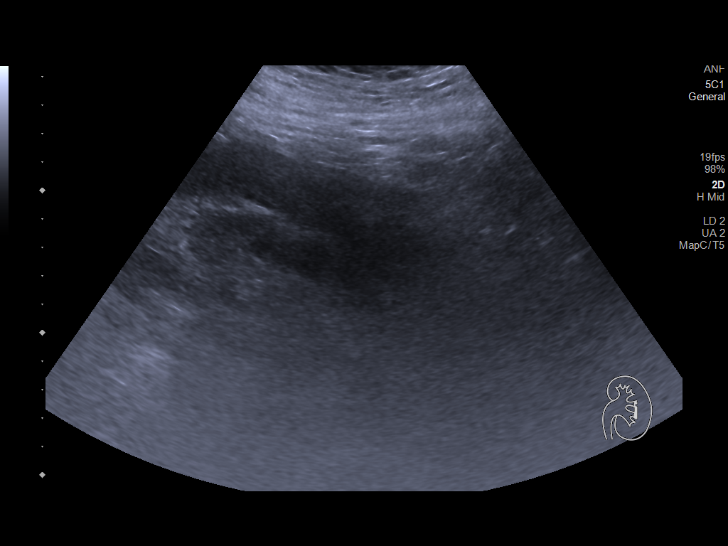
[im 20/32]
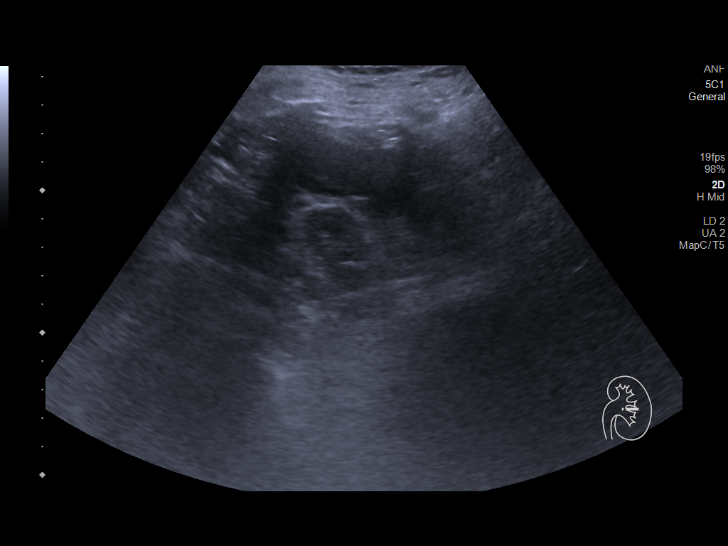
[im 21/32]
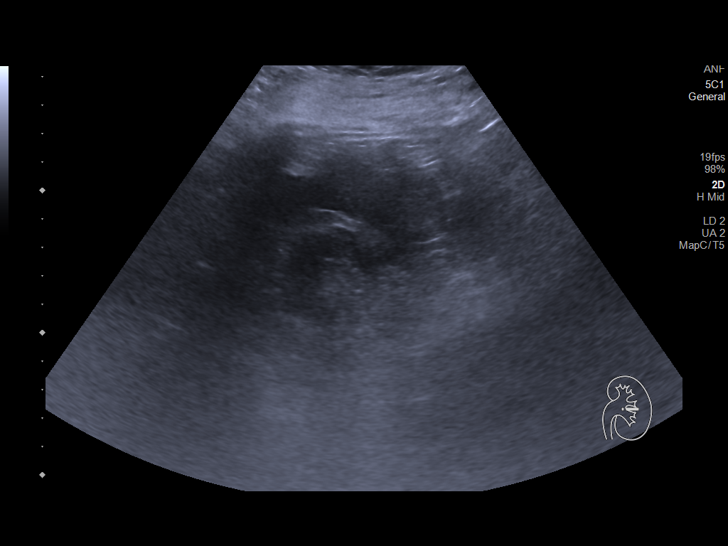
[im 24/32]
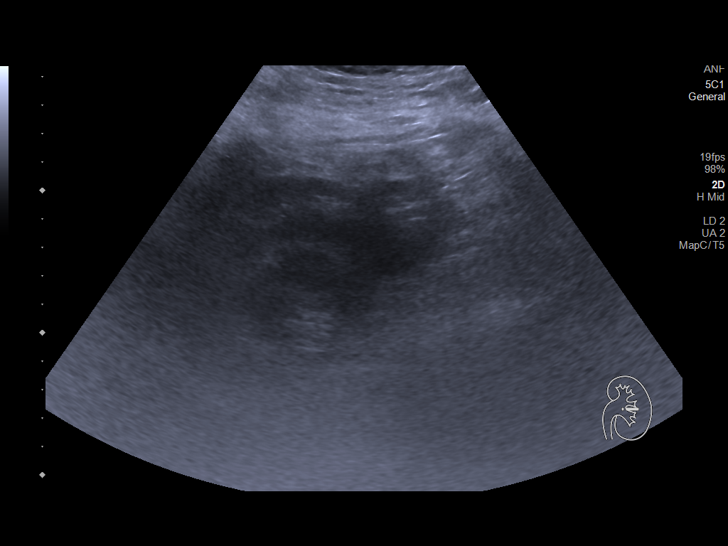
[im 26/32]
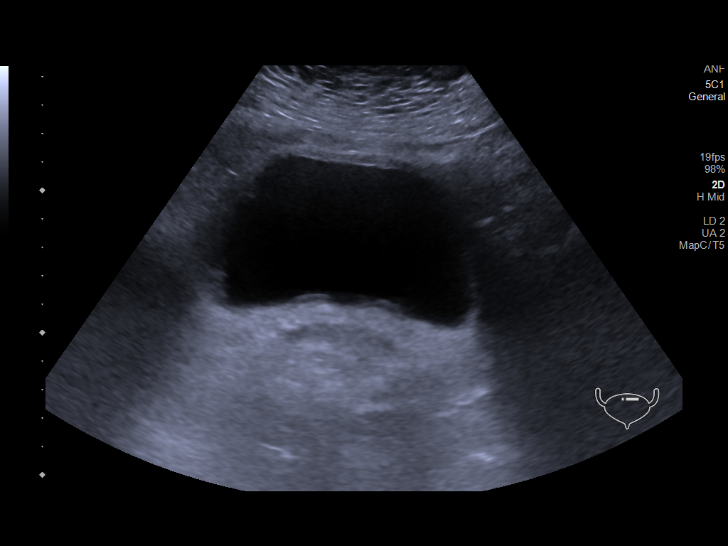
[im 29/32]
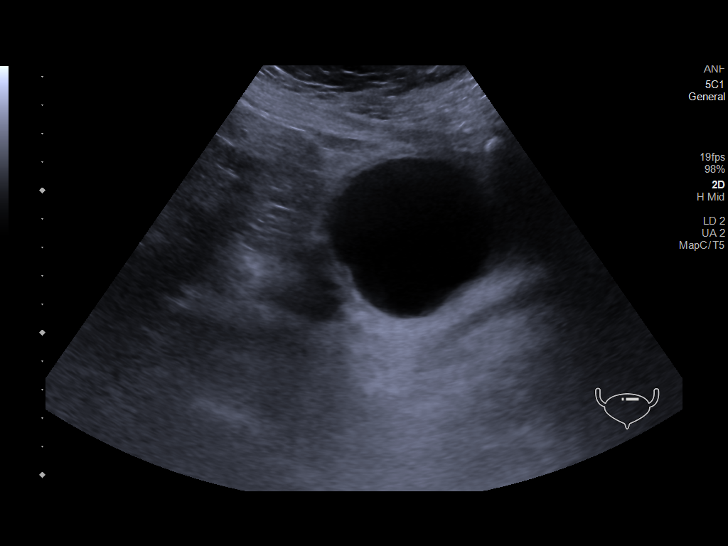
[im 32/32]
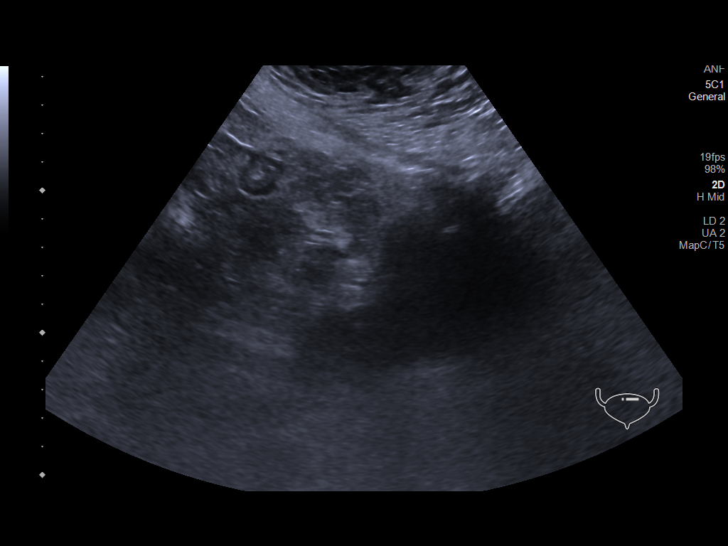

[14 of 25 positions shown; findings below may reference images not displayed]

FINDINGS: Right Kidney:

Length: 7.8 cm x 4.3 cm x 4.5 cm, 78 cc. No hydronephrosis.
Echogenic cortex, similar to the prior. Flow confirmed in the hilum
of the right kidney.

Left Kidney:

Length: 9.0 cm x 3.9 cm x 3.6 cm, 65 cc. No hydronephrosis. Similar
echogenicity to the comparison, symmetric to the contralateral. No
large perinephric fluid.

Bladder:

Appears normal for degree of bladder distention.

Incidental right pleural effusion.
IMPRESSION: No evidence of hydronephrosis.

No large fluid collection adjacent to the left kidney.

Right pleural effusion.

## 2020-06-16 IMAGING — DX PORTABLE CHEST - 1 VIEW
1 series · 1 of 1 positions shown · non-contrast
Comparison: 01/09/2019, 01/08/2019, 01/06/2019, CT chest 01/06/2019

CLINICAL DATA: 53-year-old female with shortness of breath

EXAM:
PORTABLE CHEST 1 VIEW

[chest]
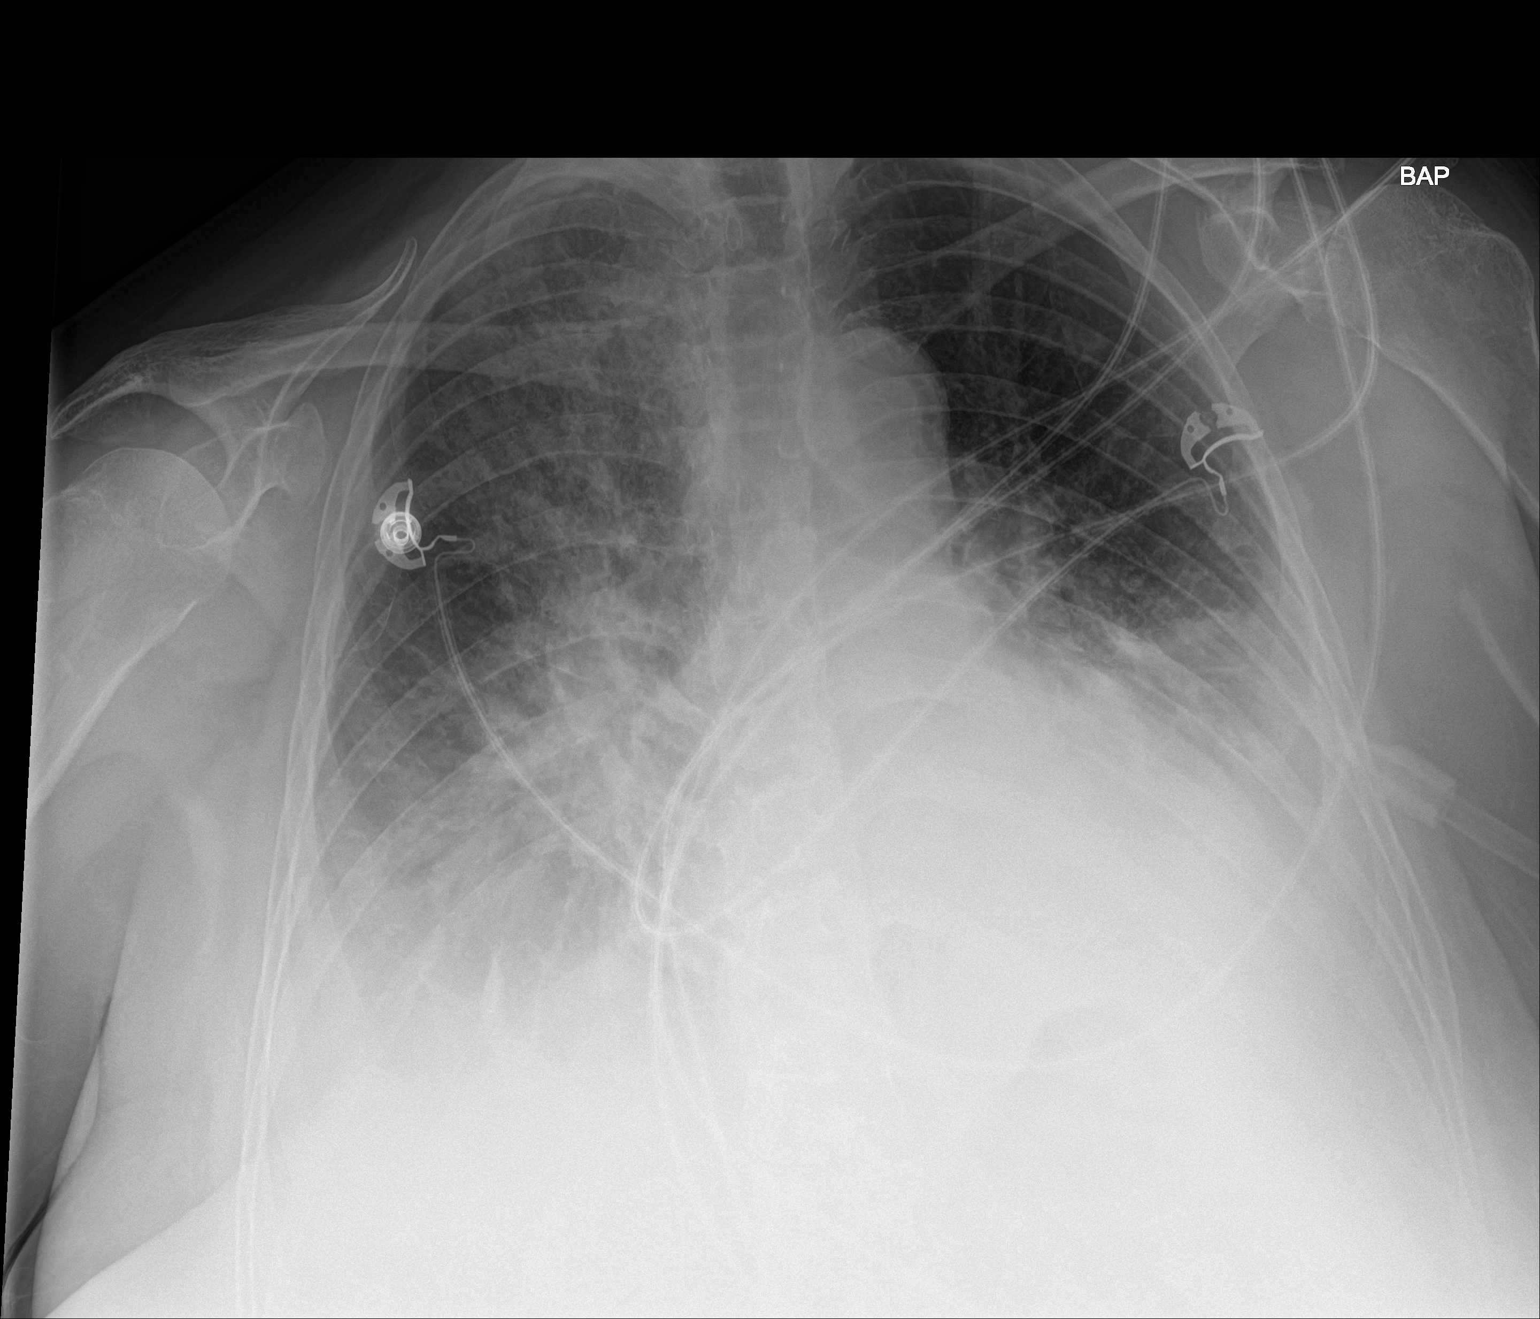

[1 of 1 positions shown; findings below may reference images not displayed]

FINDINGS: Cardiomediastinal silhouette is unchanged in size and contour. Heart
borders partially obscured by overlying lung/pleural disease.

Failed opacities at the bilateral lung bases, with dense opacities
obscuring the bilateral hemidiaphragms and heart borders. Mixed
interstitial and airspace opacities of the right hilar and
suprahilar region extending superiorly.

No pneumothorax.  Aeration maintained in the left upper lobe.
IMPRESSION: Similar appearance of the chest x-ray, with right greater than left
airspace/interstitial disease and bilateral pleural effusions with
associated atelectasis/consolidation.

## 2020-06-17 IMAGING — DX PORTABLE CHEST - 1 VIEW
1 series · 1 of 1 positions shown · non-contrast
Comparison: 01/09/2019

CLINICAL DATA: 53-year-old female with shortness of breath and
cough

EXAM:
PORTABLE CHEST 1 VIEW

[chest ap]
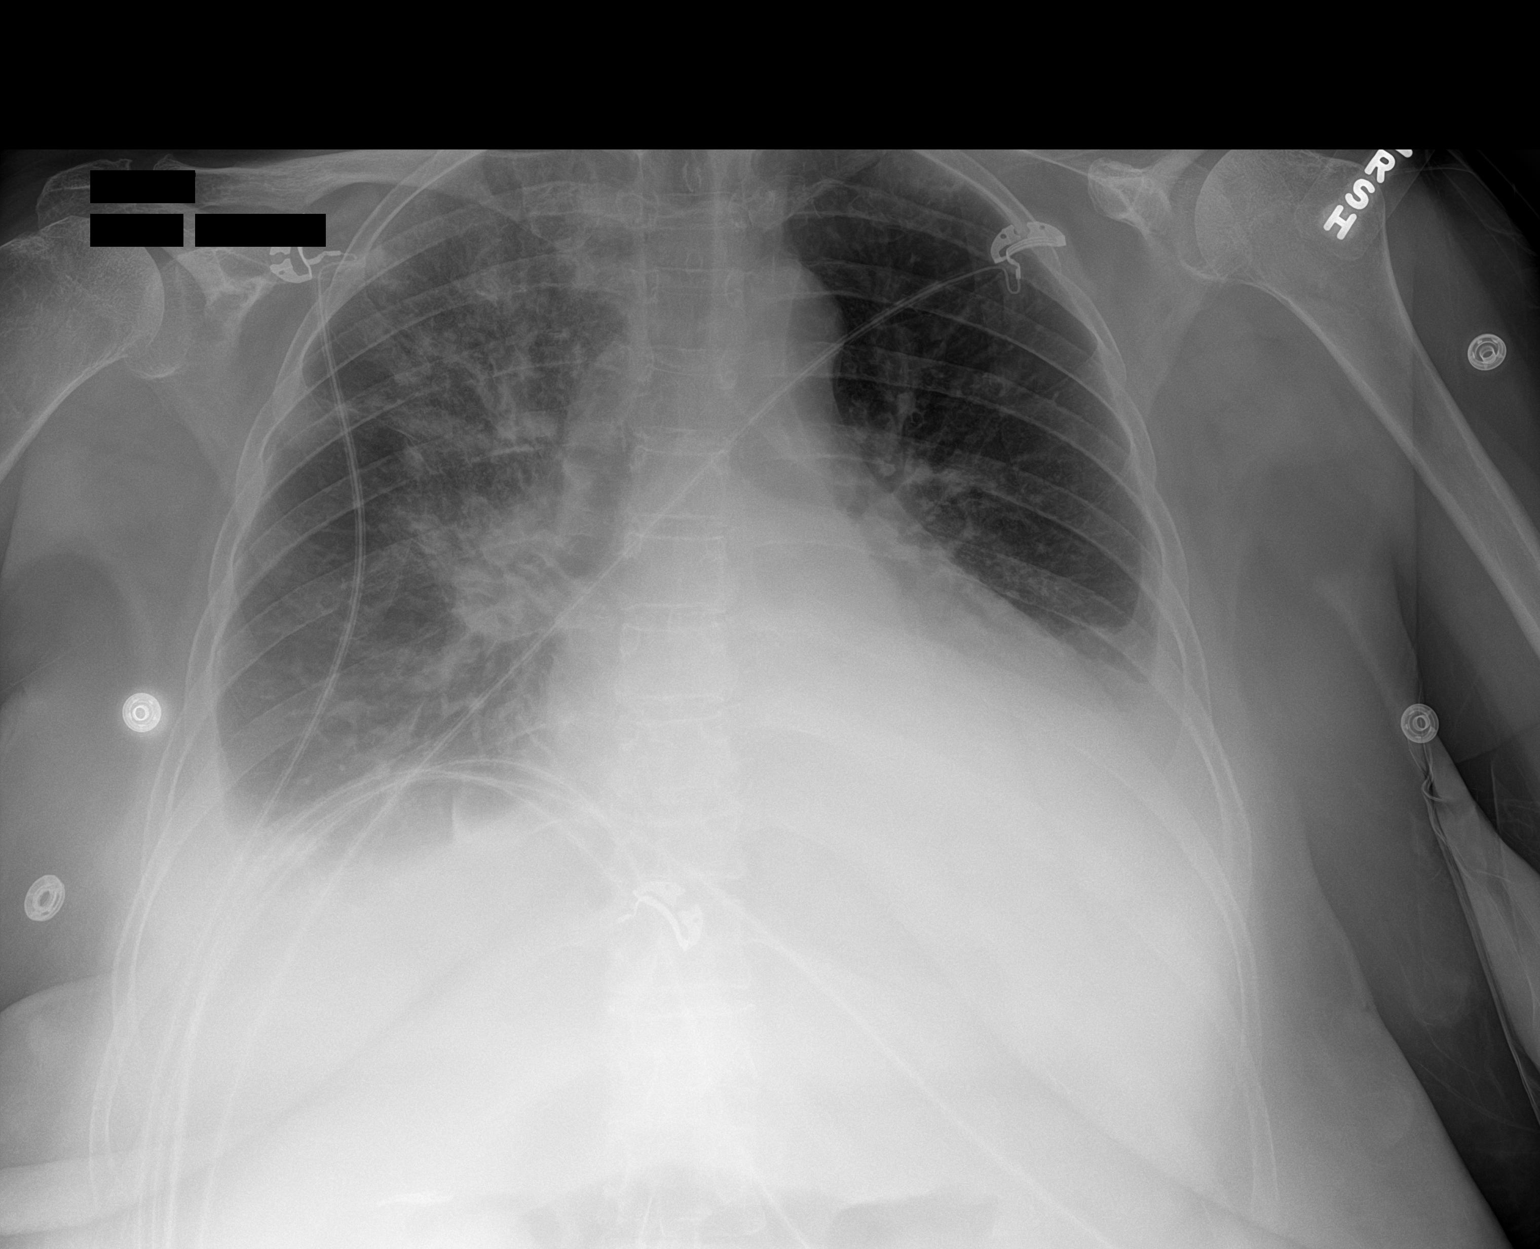

[1 of 1 positions shown; findings below may reference images not displayed]

FINDINGS: Cardiomediastinal silhouette unchanged in size and contour.

Interval improvement in aeration bilateral lungs with they will
opacities at the bases of the lungs. Opacity at the right lung base
and left lung base obscuring the costophrenic angles and the
hemidiaphragm.

Mixed interstitial and airspace opacity of the right hilar and
suprahilar region, slightly improved from the prior.
IMPRESSION: Improving aeration in the right lung with persistent interstitial
and airspace opacity of the hilar and suprahilar region.

Bilateral pleural effusions with associated
atelectasis/consolidation.

## 2020-06-17 IMAGING — XA IR ULTRASOUND GUIDANCE VASC ACCESS RIGHT
1 series · 2 of 2 positions shown · non-contrast
Comparison: none

INDICATION: 53-year-old with acute kidney injury and needs a dialysis catheter.

[Series 300: dsa body · 2 of 2 slices shown]
[im 1/2]
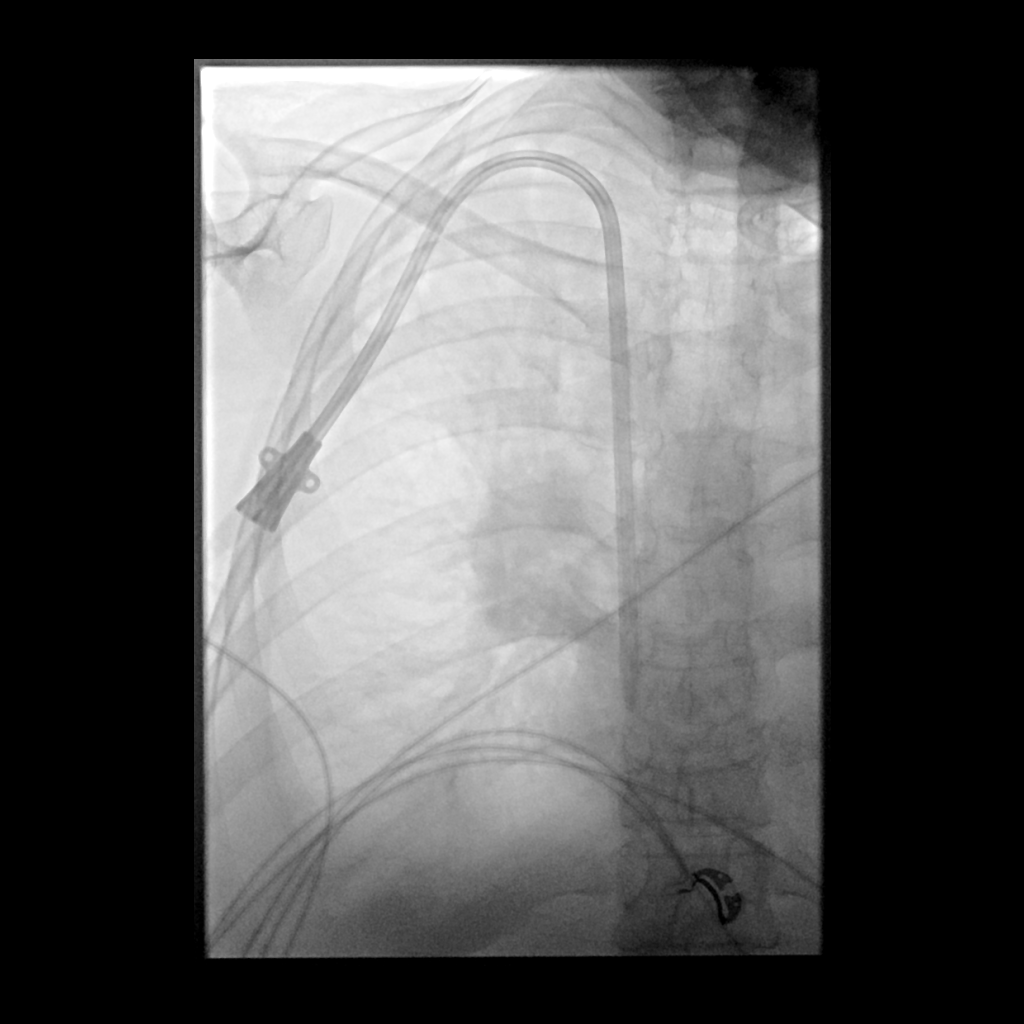
[im 2/2]
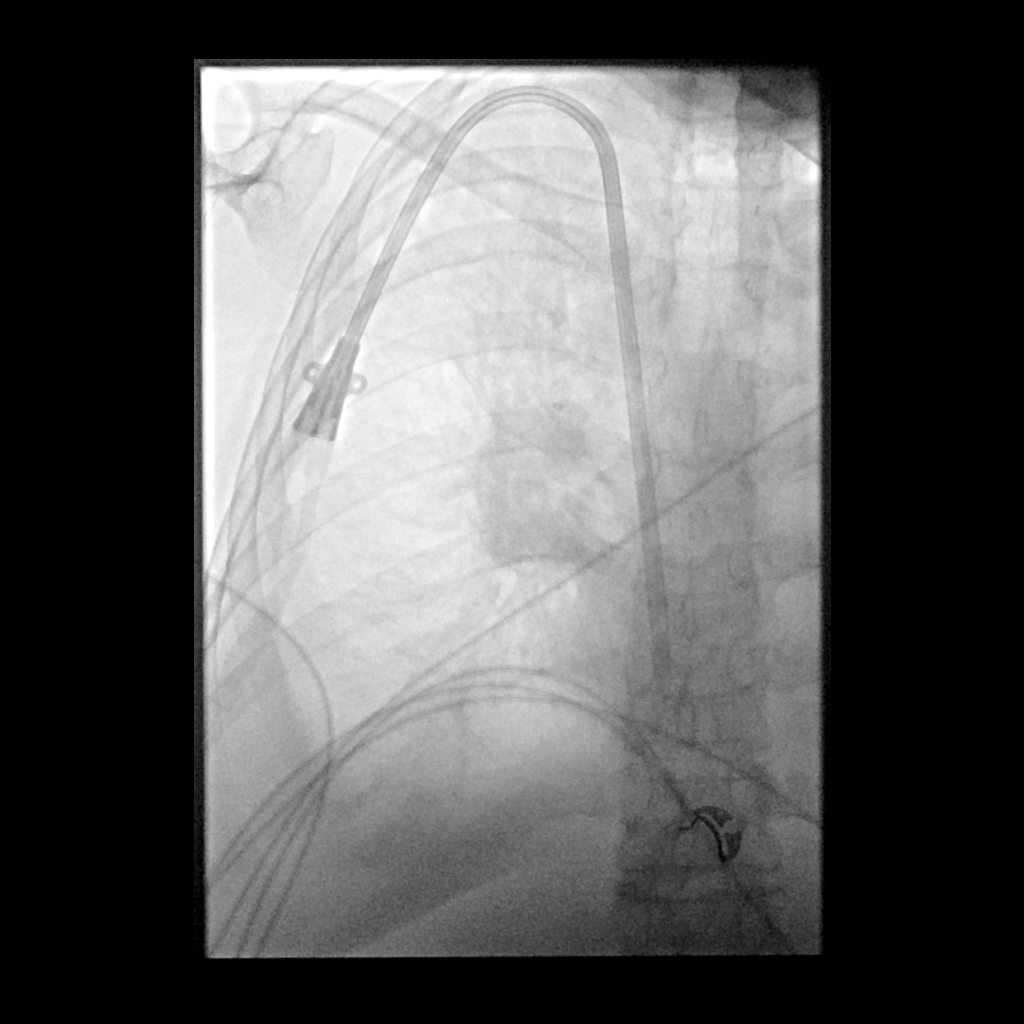

[2 of 2 positions shown; findings below may reference images not displayed]

EXAM:
FLUOROSCOPIC AND ULTRASOUND GUIDED PLACEMENT OF A TUNNELED DIALYSIS
CATHETER

MEDICATIONS:
Vancomycin 1 gm IV; The antibiotic was administered within an
appropriate time interval prior to skin puncture.

ANESTHESIA/SEDATION:
No sedation was given due to respiratory distress. Patient tolerated
the procedure well with a non-rebreather.

FLUOROSCOPY TIME:  Fluoroscopy Time: 36 seconds, 17 mGy

COMPLICATIONS:
None immediate.

PROCEDURE:
Informed consent was obtained for placement of a tunneled dialysis
catheter. The patient was placed supine on the interventional table.
Ultrasound confirmed a patent right internal jugular vein.
Ultrasound images were obtained for documentation. The right side of
the neck and chest was prepped and draped in a sterile fashion. The
right side of the neck was anesthetized with 1% lidocaine. Maximal
barrier sterile technique was utilized including caps, mask, sterile
gowns, sterile gloves, sterile drape, hand hygiene and skin
antiseptic. A small incision was made with #11 blade scalpel. A 21
gauge needle directed into the right internal jugular vein with
ultrasound guidance. A micropuncture dilator set was placed. A 23 cm
tip to cuff Palindrome catheter was selected. The skin below the
right clavicle was anesthetized and a small incision was made with
an #11 blade scalpel. A subcutaneous tunnel was formed to the vein
dermatotomy site. The catheter was brought through the tunnel. The
vein dermatotomy site was dilated to accommodate a peel-away sheath.
The catheter was placed through the peel-away sheath and directed
into the central venous structures. The tip of the catheter was
placed in the upper right atrium with fluoroscopy. Fluoroscopic
images were obtained for documentation. Both lumens were found to
aspirate and flush well. The proper amount of heparin was flushed in
both lumens. The vein dermatotomy site was closed using a single
layer of absorbable suture and Dermabond. Gel-Foam was placed in the
subcutaneous tract. The catheter was secured to the skin using
Prolene suture.
IMPRESSION: Successful placement of a right jugular tunneled dialysis catheter
using ultrasound and fluoroscopic guidance.

## 2020-06-18 DIAGNOSIS — N186 End stage renal disease: Secondary | ICD-10-CM | POA: Diagnosis not present

## 2020-06-18 DIAGNOSIS — E8779 Other fluid overload: Secondary | ICD-10-CM | POA: Diagnosis not present

## 2020-06-18 DIAGNOSIS — N2581 Secondary hyperparathyroidism of renal origin: Secondary | ICD-10-CM | POA: Diagnosis not present

## 2020-06-18 DIAGNOSIS — Z992 Dependence on renal dialysis: Secondary | ICD-10-CM | POA: Diagnosis not present

## 2020-06-19 ENCOUNTER — Other Ambulatory Visit (HOSPITAL_COMMUNITY)
Admission: RE | Admit: 2020-06-19 | Discharge: 2020-06-19 | Disposition: A | Discharge status: Home / Self Care | Payer: Medicare Other | Source: Ambulatory Visit | Attending: Vascular Surgery | Admitting: Vascular Surgery

## 2020-06-19 ENCOUNTER — Other Ambulatory Visit: Admission: RE | Admit: 2020-06-19 | Payer: Medicare Other | Source: Ambulatory Visit

## 2020-06-19 DIAGNOSIS — Z01812 Encounter for preprocedural laboratory examination: Secondary | ICD-10-CM | POA: Diagnosis not present

## 2020-06-19 DIAGNOSIS — Z20822 Contact with and (suspected) exposure to covid-19: Secondary | ICD-10-CM | POA: Insufficient documentation

## 2020-06-19 LAB — SARS CORONAVIRUS 2 (TAT 6-24 HRS): SARS Coronavirus 2: NEGATIVE

## 2020-06-19 NOTE — Addendum Note (Signed)
Addended by: Nicholas Lose on: 06/19/2020 05:06 PM   Modules accepted: Orders

## 2020-06-21 ENCOUNTER — Ambulatory Visit (HOSPITAL_COMMUNITY): Payer: Medicare Other

## 2020-06-21 ENCOUNTER — Encounter (HOSPITAL_COMMUNITY): Payer: Self-pay | Admitting: Internal Medicine

## 2020-06-21 ENCOUNTER — Encounter (HOSPITAL_COMMUNITY)
Admission: RE | Disposition: A | Discharge status: Home / Self Care | Payer: Self-pay | Source: Home / Self Care | Attending: Vascular Surgery

## 2020-06-21 ENCOUNTER — Other Ambulatory Visit: Payer: Self-pay

## 2020-06-21 ENCOUNTER — Ambulatory Visit (HOSPITAL_COMMUNITY)
Admission: RE | Admit: 2020-06-21 | Discharge: 2020-06-21 | Disposition: A | Discharge status: Home / Self Care | Payer: Medicare Other | Attending: Vascular Surgery | Admitting: Vascular Surgery

## 2020-06-21 DIAGNOSIS — K219 Gastro-esophageal reflux disease without esophagitis: Secondary | ICD-10-CM | POA: Diagnosis not present

## 2020-06-21 DIAGNOSIS — I132 Hypertensive heart and chronic kidney disease with heart failure and with stage 5 chronic kidney disease, or end stage renal disease: Secondary | ICD-10-CM | POA: Diagnosis not present

## 2020-06-21 DIAGNOSIS — F17290 Nicotine dependence, other tobacco product, uncomplicated: Secondary | ICD-10-CM | POA: Diagnosis not present

## 2020-06-21 DIAGNOSIS — F419 Anxiety disorder, unspecified: Secondary | ICD-10-CM | POA: Diagnosis not present

## 2020-06-21 DIAGNOSIS — E782 Mixed hyperlipidemia: Secondary | ICD-10-CM | POA: Diagnosis present

## 2020-06-21 DIAGNOSIS — N186 End stage renal disease: Secondary | ICD-10-CM | POA: Diagnosis not present

## 2020-06-21 DIAGNOSIS — Z888 Allergy status to other drugs, medicaments and biological substances status: Secondary | ICD-10-CM | POA: Insufficient documentation

## 2020-06-21 DIAGNOSIS — M109 Gout, unspecified: Secondary | ICD-10-CM | POA: Insufficient documentation

## 2020-06-21 DIAGNOSIS — J90 Pleural effusion, not elsewhere classified: Secondary | ICD-10-CM | POA: Diagnosis not present

## 2020-06-21 DIAGNOSIS — Z880 Allergy status to penicillin: Secondary | ICD-10-CM | POA: Insufficient documentation

## 2020-06-21 DIAGNOSIS — Z452 Encounter for adjustment and management of vascular access device: Secondary | ICD-10-CM

## 2020-06-21 DIAGNOSIS — J45909 Unspecified asthma, uncomplicated: Secondary | ICD-10-CM | POA: Insufficient documentation

## 2020-06-21 DIAGNOSIS — Y841 Kidney dialysis as the cause of abnormal reaction of the patient, or of later complication, without mention of misadventure at the time of the procedure: Secondary | ICD-10-CM | POA: Diagnosis not present

## 2020-06-21 DIAGNOSIS — Z8673 Personal history of transient ischemic attack (TIA), and cerebral infarction without residual deficits: Secondary | ICD-10-CM | POA: Diagnosis not present

## 2020-06-21 DIAGNOSIS — T82510A Breakdown (mechanical) of surgically created arteriovenous fistula, initial encounter: Secondary | ICD-10-CM | POA: Diagnosis not present

## 2020-06-21 DIAGNOSIS — Z881 Allergy status to other antibiotic agents status: Secondary | ICD-10-CM | POA: Insufficient documentation

## 2020-06-21 DIAGNOSIS — I10 Essential (primary) hypertension: Secondary | ICD-10-CM | POA: Diagnosis present

## 2020-06-21 DIAGNOSIS — I5042 Chronic combined systolic (congestive) and diastolic (congestive) heart failure: Secondary | ICD-10-CM | POA: Diagnosis not present

## 2020-06-21 DIAGNOSIS — F329 Major depressive disorder, single episode, unspecified: Secondary | ICD-10-CM | POA: Insufficient documentation

## 2020-06-21 DIAGNOSIS — I252 Old myocardial infarction: Secondary | ICD-10-CM | POA: Diagnosis not present

## 2020-06-21 DIAGNOSIS — T82590A Other mechanical complication of surgically created arteriovenous fistula, initial encounter: Secondary | ICD-10-CM | POA: Diagnosis not present

## 2020-06-21 DIAGNOSIS — N185 Chronic kidney disease, stage 5: Secondary | ICD-10-CM | POA: Diagnosis not present

## 2020-06-21 DIAGNOSIS — Z992 Dependence on renal dialysis: Secondary | ICD-10-CM | POA: Insufficient documentation

## 2020-06-21 DIAGNOSIS — Z86711 Personal history of pulmonary embolism: Secondary | ICD-10-CM | POA: Diagnosis not present

## 2020-06-21 DIAGNOSIS — I2510 Atherosclerotic heart disease of native coronary artery without angina pectoris: Secondary | ICD-10-CM | POA: Diagnosis not present

## 2020-06-21 DIAGNOSIS — Z79899 Other long term (current) drug therapy: Secondary | ICD-10-CM | POA: Diagnosis not present

## 2020-06-21 DIAGNOSIS — Z885 Allergy status to narcotic agent status: Secondary | ICD-10-CM | POA: Diagnosis not present

## 2020-06-21 DIAGNOSIS — J9811 Atelectasis: Secondary | ICD-10-CM | POA: Diagnosis not present

## 2020-06-21 DIAGNOSIS — T82898A Other specified complication of vascular prosthetic devices, implants and grafts, initial encounter: Secondary | ICD-10-CM | POA: Diagnosis not present

## 2020-06-21 HISTORY — PX: A/V FISTULAGRAM: CATH118298

## 2020-06-21 HISTORY — PX: TEMPORARY DIALYSIS CATHETER: CATH118312

## 2020-06-21 LAB — POCT I-STAT, CHEM 8
BUN: 51 mg/dL — ABNORMAL HIGH (ref 6–20)
Calcium, Ion: 1.07 mmol/L — ABNORMAL LOW (ref 1.15–1.40)
Chloride: 98 mmol/L (ref 98–111)
Creatinine, Ser: 6.5 mg/dL — ABNORMAL HIGH (ref 0.44–1.00)
Glucose, Bld: 88 mg/dL (ref 70–99)
HCT: 34 % — ABNORMAL LOW (ref 36.0–46.0)
Hemoglobin: 11.6 g/dL — ABNORMAL LOW (ref 12.0–15.0)
Potassium: 3.1 mmol/L — ABNORMAL LOW (ref 3.5–5.1)
Sodium: 134 mmol/L — ABNORMAL LOW (ref 135–145)
TCO2: 20 mmol/L — ABNORMAL LOW (ref 22–32)

## 2020-06-21 IMAGING — CT CT ABDOMEN AND PELVIS WITHOUT CONTRAST
2 of 4 series · 16 of 46 positions shown, 18 images · non-contrast
Comparison: CT 01/06/2019

CLINICAL DATA: Lymphadenopathy.

EXAM:
CT ABDOMEN AND PELVIS WITHOUT CONTRAST
TECHNIQUE: Multidetector CT imaging of the abdomen and pelvis was performed
following the standard protocol without IV contrast.

[Series 3: a/p w/o 5mm · axial · non-contrast · 0.95mm/px · z∈[-483,-53]mm · 13 of 96 slices shown, 15 images]
[im 5/96  soft-tissue]
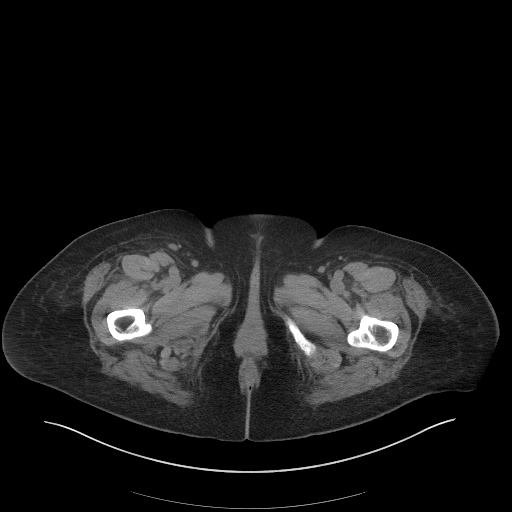
[im 5/96  bone]
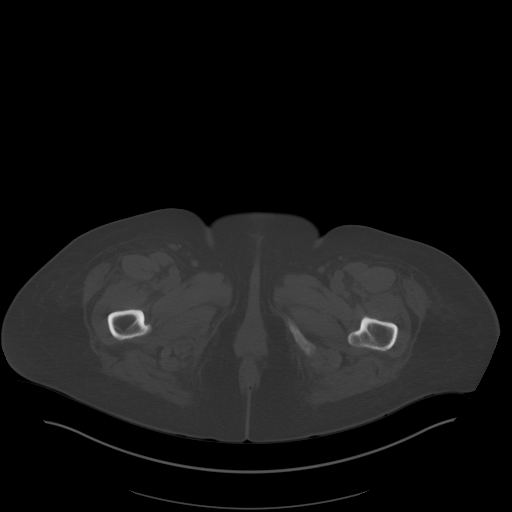
[im 13/96  soft-tissue]
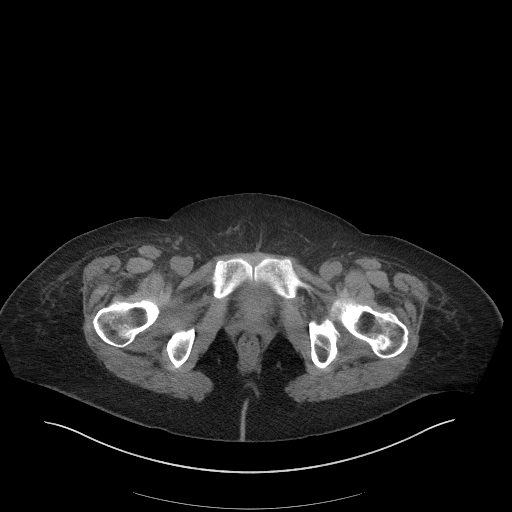
[im 21/96  soft-tissue]
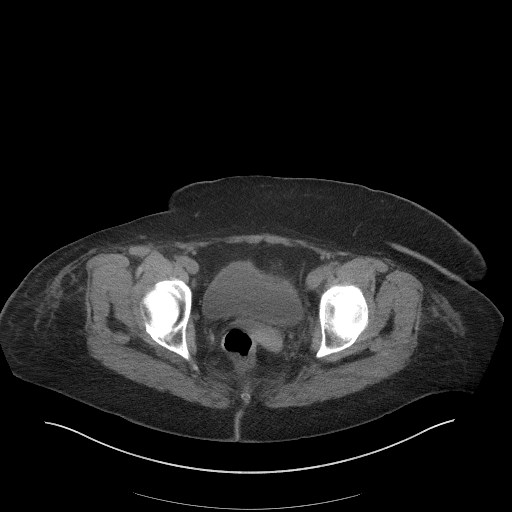
[im 25/96  soft-tissue]
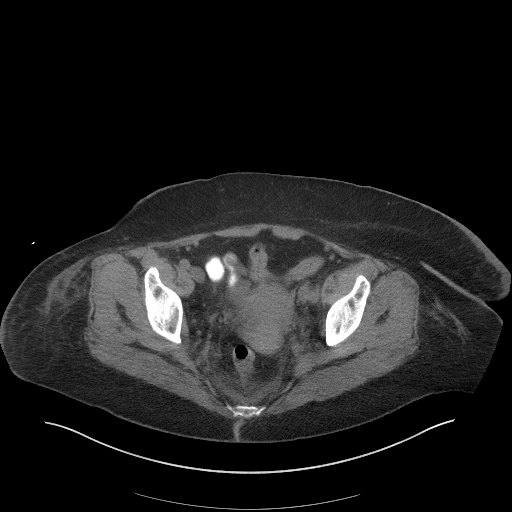
[im 34/96  soft-tissue]
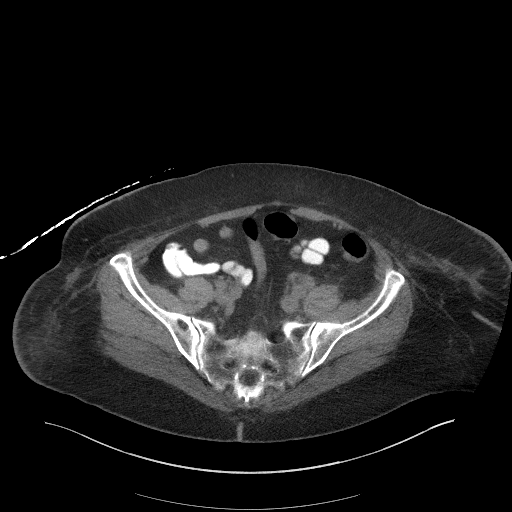
[im 42/96  soft-tissue]
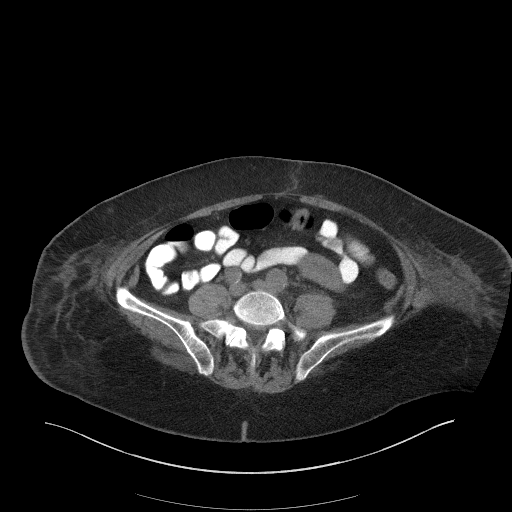
[im 50/96  soft-tissue]
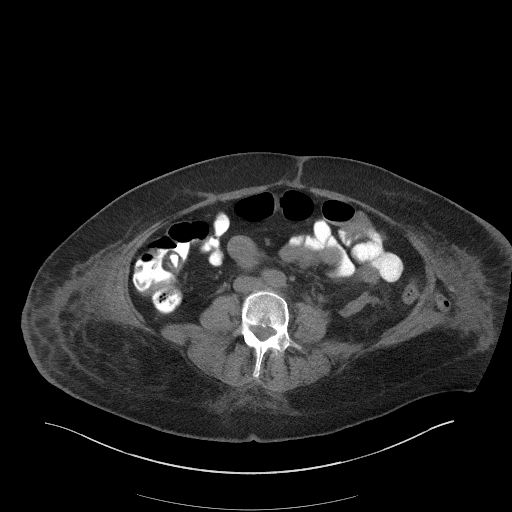
[im 54/96  soft-tissue]
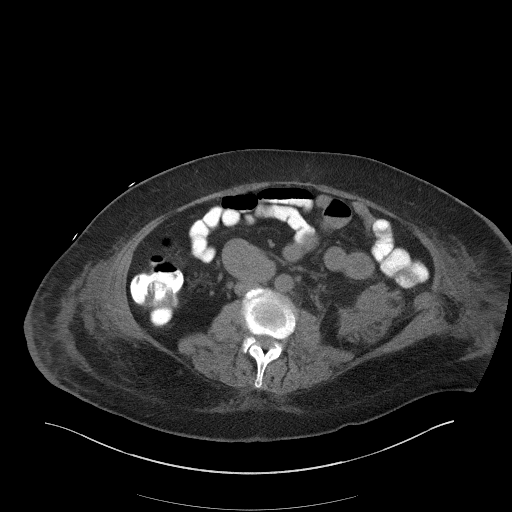
[im 62/96  soft-tissue]
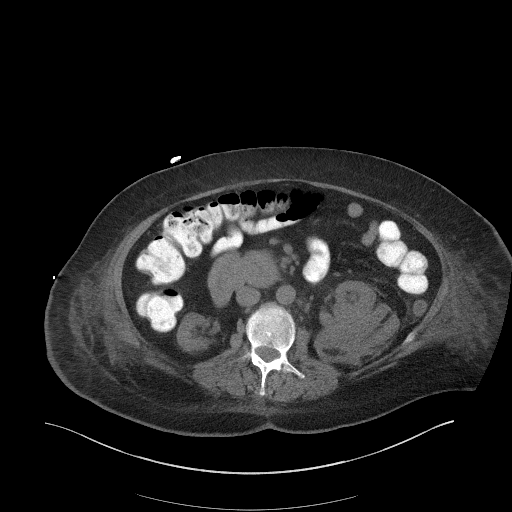
[im 62/96  bone]
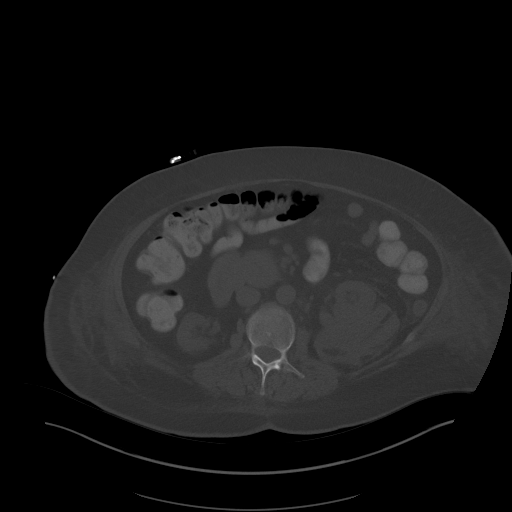
[im 71/96  soft-tissue]
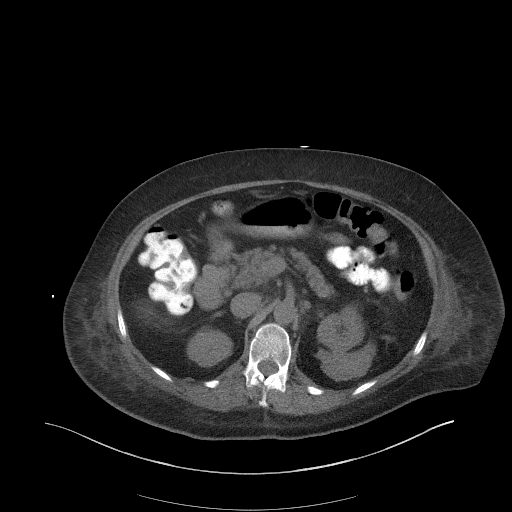
[im 75/96  soft-tissue]
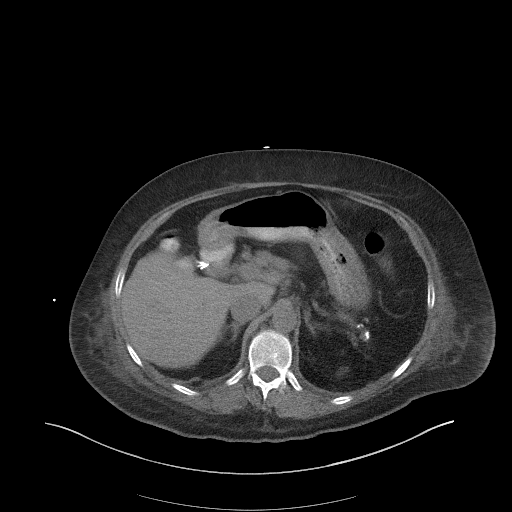
[im 83/96  soft-tissue]
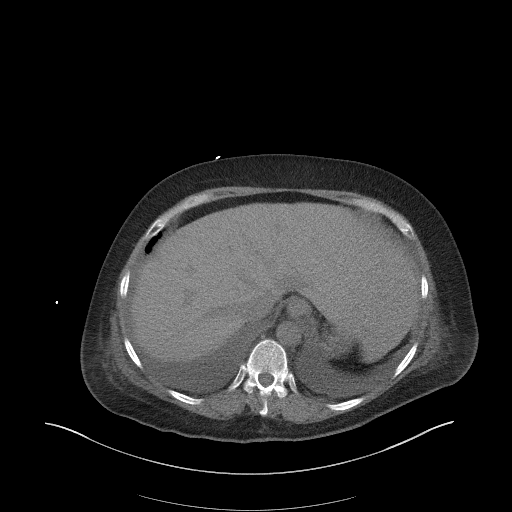
[im 91/96  soft-tissue]
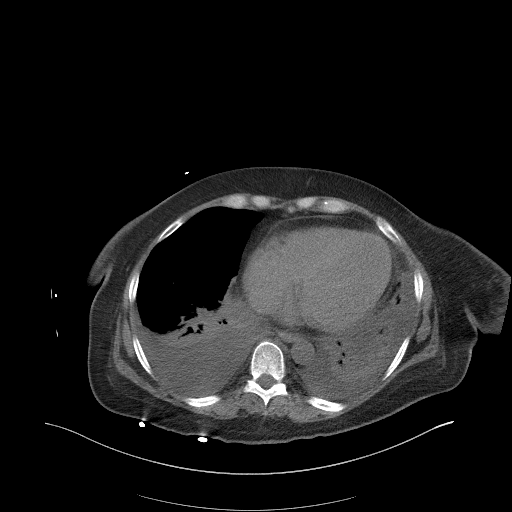

[Series 6: a/p w/o cor · coronal · non-contrast · 0.75mm/px · 3 of 137 slices shown]
[im 46/137  soft-tissue]
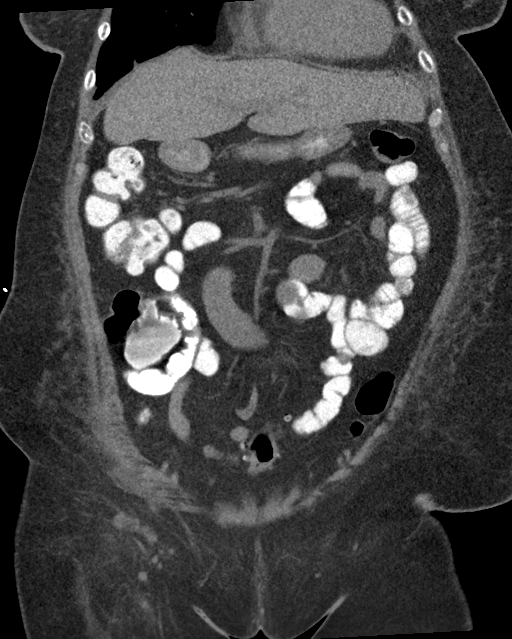
[im 61/137  soft-tissue]
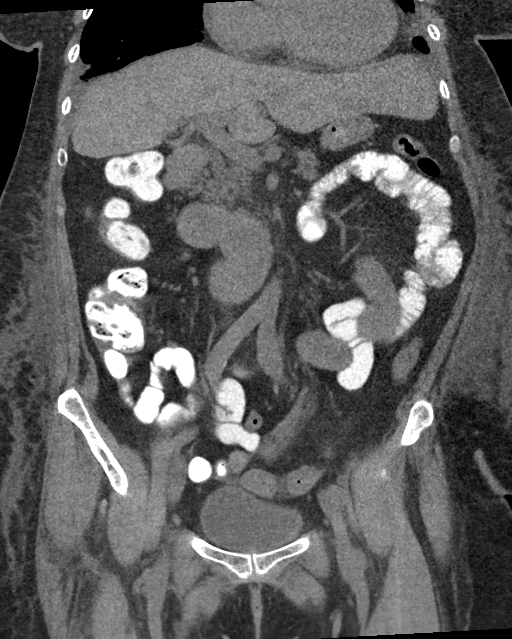
[im 76/137  soft-tissue]
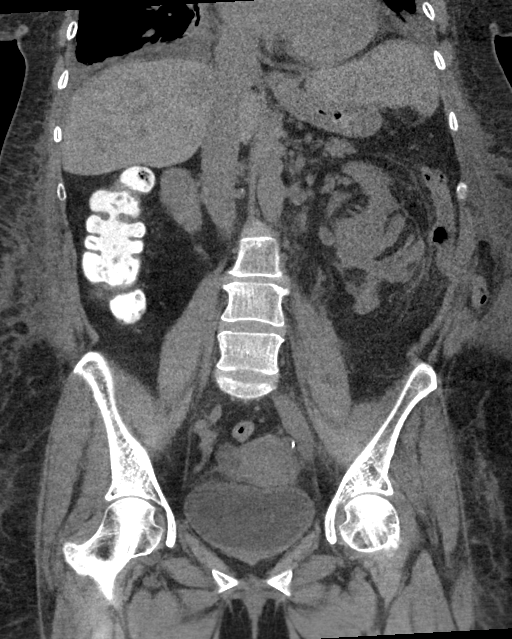

[16 of 46 positions shown; findings below may reference images not displayed]

FINDINGS: Lower chest: Bilateral pleural effusions and basilar passive
atelectasis similar to slightly increased from comparison exam.

Hepatobiliary: Non IV contrast images demonstrate no focal hepatic
lesion. Postcholecystectomy.

Pancreas: Pancreas is normal. No ductal dilatation. No pancreatic
inflammation.

Spleen: Post splenectomy

Adrenals/urinary tract: A fluid collection in the LEFT perinephric
space deep to the LEFT kidney similar to comparison exam and
consistent with post a biopsy hematoma. No increased volume of the
hematoma. No Organization to suggest abscess. Non IV contrast study
noted.

The kidneys otherwise are unremarkable on this noncontrast exam. No
hydronephrosis. Bladder normal.

Stomach/Bowel: Stomach, small-bowel cecum normal. The colon and
rectosigmoid colon are normal.

The descending colon does exit a LEFT lateral abdominal wall hernia
for approximately 3 cm (image 74/6). No evidence obstruction. The
hernia is evident on comparison exams but no bowel enters the hernia
sac on comparison exams.

Vascular/Lymphatic: Abdominal aorta is normal caliber. No periportal
or retroperitoneal adenopathy. No pelvic adenopathy.

Reproductive: Uterus and ovaries normal.

Other: No free fluid.

Musculoskeletal: No aggressive osseous lesion.
IMPRESSION: 1. Stable perinephric hematoma deep to the LEFT kidney. No evidence
of renal infection on noncontrast exam.
2. Descending colon has a short segment entering a LEFT lateral wall
hernia. No evidence obstruction.
3. Bilateral moderate size pleural effusions with basilar passive
atelectasis. Mild increase in effusion volume.

## 2020-06-21 SURGERY — A/V FISTULAGRAM
Anesthesia: LOCAL

## 2020-06-21 MED ORDER — HEPARIN (PORCINE) IN NACL 1000-0.9 UT/500ML-% IV SOLN
INTRAVENOUS | Status: AC
  Filled 2020-06-21: qty 500

## 2020-06-21 MED ORDER — LIDOCAINE HCL (PF) 1 % IJ SOLN
INTRAMUSCULAR | Status: AC
  Filled 2020-06-21: qty 30

## 2020-06-21 MED ORDER — SODIUM CHLORIDE 0.9% FLUSH: 3.0000 mL | Freq: Two times a day (BID) | INTRAVENOUS | Status: DC

## 2020-06-21 MED ORDER — ONDANSETRON HCL 4 MG/2ML IJ SOLN: 4.0000 mg | Freq: Four times a day (QID) | INTRAMUSCULAR | Status: DC | PRN

## 2020-06-21 MED ORDER — HYDRALAZINE HCL 20 MG/ML IJ SOLN: 5.0000 mg | INTRAMUSCULAR | Status: DC | PRN

## 2020-06-21 MED ORDER — HEPARIN SODIUM (PORCINE) 1000 UNIT/ML IJ SOLN
INTRAMUSCULAR | Status: AC
  Filled 2020-06-21: qty 1

## 2020-06-21 MED ORDER — HEPARIN (PORCINE) IN NACL 1000-0.9 UT/500ML-% IV SOLN
INTRAVENOUS | Status: DC | PRN
  Administered 2020-06-21: 500 mL

## 2020-06-21 MED ORDER — SODIUM CHLORIDE 0.9 % IV SOLN: 250.0000 mL | INTRAVENOUS | Status: DC | PRN

## 2020-06-21 MED ORDER — SODIUM CHLORIDE 0.9% FLUSH: 3.0000 mL | INTRAVENOUS | Status: DC | PRN

## 2020-06-21 MED ORDER — LABETALOL HCL 5 MG/ML IV SOLN: 10.0000 mg | INTRAVENOUS | Status: DC | PRN

## 2020-06-21 MED ORDER — MIDAZOLAM HCL 2 MG/2ML IJ SOLN
INTRAMUSCULAR | Status: DC | PRN
  Administered 2020-06-21: 1 mg via INTRAVENOUS

## 2020-06-21 MED ORDER — ACETAMINOPHEN 325 MG PO TABS
650.0000 mg | ORAL_TABLET | ORAL | Status: DC | PRN
  Administered 2020-06-21: 650 mg via ORAL
  Filled 2020-06-21: qty 2

## 2020-06-21 MED ORDER — FENTANYL CITRATE (PF) 100 MCG/2ML IJ SOLN
INTRAMUSCULAR | Status: AC
  Filled 2020-06-21: qty 2

## 2020-06-21 MED ORDER — MIDAZOLAM HCL 2 MG/2ML IJ SOLN
INTRAMUSCULAR | Status: AC
  Filled 2020-06-21: qty 2

## 2020-06-21 MED ORDER — FENTANYL CITRATE (PF) 100 MCG/2ML IJ SOLN
INTRAMUSCULAR | Status: DC | PRN
Start: 2020-06-21 — End: 2020-06-21
  Administered 2020-06-21: 25 ug via INTRAVENOUS

## 2020-06-21 MED ORDER — LIDOCAINE HCL (PF) 1 % IJ SOLN
INTRAMUSCULAR | Status: DC | PRN
  Administered 2020-06-21: 2 mL via INTRADERMAL
  Administered 2020-06-21: 35 mL via INTRADERMAL

## 2020-06-21 SURGICAL SUPPLY — 11 items
BAG SNAP BAND KOVER 36X36 (MISCELLANEOUS) ×3 IMPLANT
CATH PALINDROME-P 19CM W/VT (CATHETERS) ×1 IMPLANT
COVER DOME SNAP 22 D (MISCELLANEOUS) ×3 IMPLANT
KIT MICROPUNCTURE NIT STIFF (SHEATH) ×1 IMPLANT
PROTECTION STATION PRESSURIZED (MISCELLANEOUS) ×3
SHEATH PROBE COVER 6X72 (BAG) ×3 IMPLANT
STATION PROTECTION PRESSURIZED (MISCELLANEOUS) ×2 IMPLANT
STOPCOCK MORSE 400PSI 3WAY (MISCELLANEOUS) ×3 IMPLANT
TRAY PV CATH (CUSTOM PROCEDURE TRAY) ×3 IMPLANT
TUBING CIL FLEX 10 FLL-RA (TUBING) ×3 IMPLANT
WIRE AMPLATZ SS-J .035X180CM (WIRE) ×1 IMPLANT

## 2020-06-21 NOTE — Interval H&P Note (Signed)
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  The various methods of treatment have been discussed with the patient and family. After consideration of risks, benefits and other options for treatment, the patient has consented to  Procedure(s): A/V FISTULAGRAM (Left) TEMPORARY DIALYSIS CATHETER (N/A) as a surgical intervention.  The patient's history has been reviewed, patient examined, no change in status, stable for surgery.  I have reviewed the patient's chart and labs.  Questions were answered to the patient's satisfaction.     Ruta Hinds

## 2020-06-21 NOTE — Consult Note (Signed)
Summer Consult Note   Summer Cook WLS:[id number] DOB: 1966/08/04 DOA: 06/21/2020  PCP: Redmond School, MD Consultants:  Oneida Alar - vascular; nephrologist; Domenic Polite - cardiology Patient coming from:  Home - lives with daughter, granddaughter and her boyfriend; NOK: Daughter, [phone]  Chief Complaint: fistula revision  HPI: Summer Cook is a 54 y.o. female with medical history significant of ESRD on TTS HD with poorly functioning fistula who presented for fistulogram and TDC replacement by Dr. Oneida Alar.  Other medical problems include: CVA: DM; PE (2013); ITP (2011); CAD; HTN; and chronic combined CHF.  She reports that she had a procedure - catheter replaced and fistulogram today.  She has been feeling mildly SOB.  Last good HD was Saturday - she went Tuesday but her catheter wouldn't work properly.  She generally feels well.     Hospital Course:  Fistulogram, changed TDC.  Has enlarging effusion.  Nephrology recommends HD prior to d/c.  Review of Systems: As per HPI; otherwise review of systems reviewed and negative.   Ambulatory Status:  Ambulates without assistance  COVID Vaccine Status:   Complete  Past Medical History:  Diagnosis Date  . Allergy   . Anxiety   . Arthritis   . Asthma   . CAD (coronary artery disease), native coronary artery    a. cath 05/20/17 - 100% distal PDA --> medical mangement (no intervention done)  . Candida esophagitis ([place])   . Chronic combined systolic and diastolic CHF (congestive heart failure) ([place])   . Depression   . End stage renal disease on dialysis [facility])    T/ Th/ Sat starting 4/28- [facility]  . Essential hypertension   . GERD (gastroesophageal reflux disease)   . Gout   . Headache   . Hemorrhoids   . History of kidney stones   . 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  Last was Prednisone $RemoveBefore'40mg'VRGIeOptFiBka$ ; 2 wk in 10/2012.  She  also was on Prednisone bridged with Cellcept briefly but stopped due to lack of insurance.   . Myocardial infarction ([place])   . Perinephric hematoma   . Pneumonia   . Pulmonary embolism ([place]) 04/2012  . Serrated adenoma of colon   . Steroid-induced diabetes (HCC)    Prednisone  . Stroke [facility])     Past Surgical History:  Procedure Laterality Date  . AV FISTULA PLACEMENT Right 01/23/2019   Procedure: ARTERIOVENOUS (AV) FISTULA CREATION RIGHT ARM;  Surgeon: Elam Dutch, MD;  Location: [place];  Service: Vascular;  Laterality: Right;  . AV FISTULA PLACEMENT Right 01/30/2019   Procedure: GORETEX GRAFT PLACEMENT  RIGHT ARM;  Surgeon: Elam Dutch, MD;  Location: [facility] OR;  Service: Vascular;  Laterality: Right;  . AV FISTULA PLACEMENT Left 08/07/2019   Procedure: LEFT BRACHIOCEPHALIC VENOUS FISTULA;  Surgeon: Elam Dutch, MD;  Location: [place];  Service: Vascular;  Laterality: Left;  . BONE MARROW BIOPSY    . CARDIAC CATHETERIZATION    . CARPAL TUNNEL RELEASE    . CARPAL TUNNEL RELEASE Left 05/20/2016   Procedure: CARPAL TUNNEL RELEASE;  Surgeon: Carole Civil, MD;  Location: [facility];  Service: Orthopedics;  Laterality: Left;  . CARPAL TUNNEL RELEASE Right 07/16/2016   Procedure: RIGHT CARPAL TUNNEL RELEASE;  Surgeon: Carole Civil, MD;  Location: [facility];  Service: Orthopedics;  Laterality: Right;  . CATARACT EXTRACTION    . CHOLECYSTECTOMY  2008  . COLONOSCOPY WITH  ESOPHAGOGASTRODUODENOSCOPY (EGD) N/A 01/04/2013   Dr. Gala Romney: esophageal plaques with +KOH, hh. Gastric antrum abnormal, bx reactive gastropathy. Anal canal hemorrhoids, colonic tics, normal TI, single polyp (sessile serrated tubular adenoma). Next TCS 12/2017  . IR FLUORO GUIDE CV LINE RIGHT  01/10/2019  . IR US GUIDE VASC ACCESS RIGHT  01/10/2019  . LEFT HEART CATH AND CORONARY ANGIOGRAPHY N/A 05/20/2017   Procedure: LEFT HEART CATH AND CORONARY ANGIOGRAPHY;  Surgeon: Martinique, Peter M, MD;  Location: [place] CV LAB;   Service: Cardiovascular;  Laterality: N/A;  . 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 TEE WITHOUT CARDIOVERSION N/A 01/03/2016   Procedure: TRANSESOPHAGEAL ECHOCARDIOGRAM (TEE) WITH PROPOFOL;  Surgeon: Satira Sark, MD;  [facility];  Service: Cardiovascular;  Laterality: N/A;    Social History   Socioeconomic History  . Marital status: Divorced    Spouse name: Not on file  . Number of children: 1  . Years of education: Not on file  . 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   Tobacco Use  . Smoking status: Former Smoker    Years: 0.00    Types: Cigarettes    Quit date: 02/14/2009    Years since quitting: 11.3  . Smokeless tobacco: Former Systems developer    Types: Snuff  Vaping Use  . Vaping Use: Never used  Substance and Sexual Activity  . Alcohol use: No    Alcohol/week: 0.0 standard drinks  . Drug use: No  . Sexual activity: Never  Other Topics Concern  . Not on file  Social History Narrative   Lives with her daughter and aunt in a one story home.  Has 1 daughter.  On disability.  Did work as a Optometrist and bus Geophysicist/field seismologist.  Education: some college.    Social Determinants of Health   Financial Resource Strain:   . Difficulty of Paying Living Expenses: Not on file  Food Insecurity:   . Worried About Charity fundraiser in the Last Year: Not on file  . Ran Out of Food in the Last Year: Not on file  Transportation Needs:   . Lack of Transportation (Medical): Not on file  . Lack of Transportation (Non-Medical): Not on file  Physical Activity:   . Days of Exercise per Week: Not on file  . Minutes of Exercise per Session: Not on file  Stress:   . Feeling of Stress : Not on file  Social Connections:   . Frequency of Communication with Friends and Family: Not on file  . Frequency of Social Gatherings with Friends and Family: Not on file  . Attends Religious Services: Not on file  .  Active Member of Clubs or Organizations: Not on file  . Attends Archivist Meetings: Not on file  . Marital Status: Not on file  Intimate Partner Violence:   . Fear of Current or Ex-Partner: Not on file  . Emotionally Abused: Not on file  . Physically Abused: Not on file  . Sexually Abused: Not on file    Allergies  Allergen Reactions  . Ciprofloxacin Nausea And Vomiting  . Ceftriaxone Rash  . Dilaudid [Hydromorphone Hcl] Anxiety and Other (See Comments)    Causes hallucinations, anxiety, and panic per patient  . [place] Venom Swelling    Swelling at injection site  . Doxycycline Itching and Nausea And Vomiting  . Adhesive [Tape] Rash  Please use paper tape  . Brintellix [Vortioxetine] Itching  . Cymbalta [Duloxetine Hcl] Itching  . Penicillins Itching    Did it involve swelling of the face/tongue/throat, SOB, or low BP? No Did it involve sudden or severe rash/hives, skin peeling, or any reaction on the inside of your mouth or nose? No Did you need to seek medical attention at a hospital or doctor's office? No When did it last happen?54 yrs old If all above answers are "NO", may proceed with cephalosporin use.    Family History  Problem Relation Age of Onset  . Cervical cancer Mother   . Lung cancer Father   . Colon polyps Father        ???  . Pneumonia Brother   . Colon cancer Paternal Grandmother 88  . AAA (abdominal aortic aneurysm) Paternal Grandmother   . Breast cancer Paternal Grandmother   . Colon cancer Paternal Grandfather 25  . Barrett's esophagus Paternal Aunt   . Stomach cancer Neg Hx   . Pancreatic cancer Neg Hx     Prior to Admission medications   Medication Sig Start Date End Date Taking? Authorizing Provider  acetaminophen (TYLENOL) 500 MG tablet Take 1,000 mg by mouth every 6 (six) hours as needed for moderate pain.   Yes [provider]  calcium acetate (PHOSLO) 667 MG capsule Take 1,334 mg by mouth 3 (three) times  daily with meals. Take 667 mg with snack 07/04/19  Yes [provider]  furosemide (LASIX) 80 MG tablet Take 80 mg by mouth See admin instructions. Take 80 mg daily on non dialysis days - Monday, Wednesday, Thursday, Friday, Sunday 12/18/19  Yes [provider]  hydrALAZINE (APRESOLINE) 50 MG tablet Take 50 mg by mouth 2 (two) times daily.   Yes [provider]  isosorbide dinitrate (ISORDIL) 10 MG tablet Take 1 tablet (10 mg total) by mouth 2 (two) times daily. 01/26/19 06/20/20 Yes Dunn, Dayna N, PA-C  loratadine (CLARITIN) 10 MG tablet Take 10 mg by mouth daily.    Yes [provider]  meclizine (ANTIVERT) 25 MG tablet Take 25 mg by mouth 4 (four) times daily as needed for dizziness.  05/15/20  Yes [provider]  metoprolol succinate (TOPROL-XL) 50 MG 24 hr tablet Take 50 mg by mouth at bedtime. Take with or immediately following a meal.    Yes [provider]  OVER THE COUNTER MEDICATION Place 1 drop into both eyes daily as needed (Dry eyes). Rohto eye drops   Yes [provider]  Oxycodone HCl 10 MG TABS Take 1 tablet (10 mg total) by mouth every 6 (six) hours as needed. Patient taking differently: Take 10 mg by mouth every 6 (six) hours as needed (Pain).  08/07/19  Yes Dagoberto Ligas, PA-C  PROAIR HFA 108 [phone] Base) MCG/ACT inhaler Inhale 2 puffs into the lungs every 4 (four) hours as needed for shortness of breath. 06/28/19  Yes [provider]  sertraline (ZOLOFT) 100 MG tablet Take 100 mg by mouth daily.   Yes [provider]  albuterol (PROVENTIL) (2.5 MG/3ML) 0.083% nebulizer solution Take 3 mLs (2.5 mg total) by nebulization every 4 (four) hours as needed for shortness of breath. 01/20/19   Elie Confer, MD  diclofenac Sodium (VOLTAREN) 1 % GEL Apply 2 g topically daily as needed (Pain).    [provider]  lidocaine-prilocaine (EMLA) cream Apply 1 application topically as needed (numbing).  02/25/19    [provider]  midodrine ([place])  5 MG tablet Take 5 mg by mouth daily as needed (for  low blood pressure.).  09/06/19   [provider]  nitroGLYCERIN (NITROSTAT) 0.4 MG SL tablet Place 1 tablet (0.4 mg total) under the tongue every 5 (five) minutes x 3 doses as needed for chest pain (if no relief after 3rd dose, proceed to the ED for an evaluation or call 911). 05/21/20   Satira Sark, MD    Physical Exam: Vitals:   06/21/20 1412 06/21/20 1415 06/21/20 1430 06/21/20 1500  BP: 122/80 117/88 116/81 120/80  Pulse: (!) 114 (!) 113 (!) 108 (!) 106  Resp:      Temp:      TempSrc:      SpO2: 98% 95% 96% 95%  Weight:      Height:         . General:  Appears calm and comfortable and is NAD; appears older than stated age . Eyes:  PERRL, EOMI, normal lids, iris . ENT:  grossly normal hearing, lips & tongue, mmm; appropriate dentition . Neck:  no LAD, masses or thyromegaly . Cardiovascular:  RR with mild tachycardia, no m/r/g. No LE edema. Left upper chest TTP following procedure. Marland Kitchen Respiratory:   Decreased breath sounds LLL > RLL.  Normal respiratory effort. . Abdomen:  soft, NT, ND, NABS . Back:   normal alignment, no CVAT . Skin:  no rash or induration seen on limited exam . Musculoskeletal:  grossly normal tone BUE/BLE, good ROM, no bony abnormality . Psychiatric:  grossly normal mood and affect, speech fluent and appropriate, AOx3 . Neurologic:  CN 2-12 grossly intact, moves all extremities in coordinated fashion    Radiological Exams on Admission: PERIPHERAL VASCULAR CATHETERIZATION  Result Date: 06/21/2020 Procedure: 1.  Ultrasound left arm, left arm fistulogram 2.  Insertion of left internal jugular vein palindrome catheter 19 cm Preoperative diagnosis: 1.  Poorly functioning left arm AV fistula 2.  End-stage renal disease Most operative diagnosis: Same Anesthesia: Local with IV sedation sedation time 12 minutes Operative findings: 1.  Kink with retained  valve midsection left arm AV fistula, large sidebranch proximal left arm AV fistula, no significant inflow stenosis 2.  19 cm palindrome catheter Operative details: After obtaining form consent, the patient taken the PV lab.  The patient placed supine position angio table.  Patient's left upper extremities prepped and draped in usual sterile fashion.  Ultrasound was used to identify the patient's left upper arm AV fistula.  This was about 10 mm in diameter.  Local anesthesia was infiltrated over this.  Micropuncture needle was used to cannulate the fistula and the micropuncture wire advanced into it.  Micropuncture sheath then placed over this.  Contrast angiogram was then obtained through the micropuncture sheath.  Central veins are widely patent.  The fistula is about 10 to 12 mm in diameter throughout its course.  There is a kink or retained valve in the mid section of the fistula.  Additionally there is a large side branch about 5 mm in diameter coming off the proximal aspect of the fistula.  There did not appear to be any inflow stenosis. At this point the micropuncture sheath was removed and the hole repaired with a single figure-of-eight Monocryl stitch.  Attention was then turned to the patient's left chest.  The entire left chest and neck were prepped and draped in usual sterile fashion as well as a pre-existing left internal jugular vein palindrome catheter.  Catheter was inspected under fluoroscopy found to  have no kinks throughout its course and the tip in the right atrium.  At this point sterile prep and drape of the left neck and chest were performed.  The sutures were clipped on the old palindrome catheter.  The new palindrome catheter had the obturators placed in each port an 035 Amplatz wire was advanced through the old palindrome catheter and this was removed with gentle traction.  The new catheter was then advanced over the guidewire into the right atrium.  The obturators were removed from the  catheter as well as the guidewire.  Both ports were noted to flush and draw easily.  The catheter was sutured the skin with nylon sutures.  Contrast angiogram showed that the catheter tip was in the right atrium.  Catheter was loaded concentrated heparin solution.  Patient tired procedure well and there were no complications.  Patient was taken the holding in stable condition. Operative management: The patient will be scheduled for revision of her left arm AV fistula with patch angioplasty or shortening of the kinked segment as well as sidebranch ligation and possible Superficialization.  Her catheter is ready for use at this point. Ruta Hinds, MD [facility] Office: [phone]  DG Chest Port 1 View  Result Date: 06/21/2020 CLINICAL DATA:  Left IJ palindrome catheter insertion. EXAM: PORTABLE CHEST 1 VIEW COMPARISON:  05/20/2020 FINDINGS: Left IJ dialysis catheter is in good position, unchanged. Stable cardiac enlargement. Enlarging left pleural effusion, now moderate in size and half way up the chest. Overlying atelectasis. The right lung is clear. Mild vascular congestion but no overt pulmonary edema. IMPRESSION: 1. Left IJ dialysis catheter in good position. 2. Enlarging left pleural effusion since yesterday's film, now half way up the chest. These results will be called to the ordering clinician or representative by the Radiologist Assistant, and communication documented in the PACS or [employer]. Electronically Signed   By: Marijo Sanes M.D.   On: 06/21/2020 14:20    EKG: not done   Labs on Admission: I have personally reviewed the available labs and imaging studies at the time of the admission.  Pertinent labs:   K+ 3.1 BUN 51/Creatinine 6.50 Hgb 11.6 COVID negative   Assessment/Plan Principal Problem:   AV graft malfunction, initial encounter [facility]) Active Problems:   Essential hypertension   Mixed hyperlipidemia   ESRD (end stage renal  disease) (HCC)   Recurrent pleural effusion on left   AV graft malfunction -Patient presenting for fistulogram and TDC exchange -These procedure were completed by Dr. Oneida Alar today -She will need to return at a later day for fistula revision  ESRD on HD -Patient on chronic TTS HD -Last full HD was last Saturday -She attempted to go Tuesday but had graft malfunction and HD was unable to be completed -Mild evidence of volume overload and worsening pleural effusion -The initial request was for observation for overnight HD; however, the patient is unlikely to be dialyzed more quickly as an inpatient than she would if she follows up at her HD center tomorrow -She also appears to be clinically stable and only mildly symptomatic without urgent apparent need for HD -As such, nephrology has recommended d/c to home with outpatient HD tomorrow AM  Recurrent pleural effusion -CXR shows expanding recurrent effusion -Likely exacerbated by volume overload associated with ESRD and inability to receive (full) HD -Based on clinical stability, ok for outpatient HD -Needs outpatient CXR early next week to see if recurrent thoracentesis is still needed -Return  urgently to the ER should SOB worsen  HTN -Continue home hydralazine, Toprol -If ongoing BP control is needed, ACE/ARB therapy would be a good choice  Chronic pain -I have reviewed this patient in the [place] Controlled Substances Reporting System.  She is receiving medications from only one provider and appears to be taking them as prescribed. -She is at high risk of opioid misuse, diversion, and increased risk of overdose.   Note: This patient has been tested and is negative for the novel coronavirus COVID-19.  Thank you for this interesting consult.  [facility] will sign off at this time, since the patient appears to be going home from Short Stay shortly.    Karmen Bongo MD [facility]   How to contact the [facility] Attending or Consulting  provider [place] or covering provider during after hours [place], for this patient?  1. Check the care team in [facility] and look for a) attending/consulting [facility] provider listed and b) the [facility] team listed 2. Log into [email or web address] and use [place]'s universal password to access. If you do not have the password, please contact the hospital operator. 3. Locate the [facility] provider you are looking for under [facility] and page to a number that you can be directly reached. 4. If you still have difficulty reaching the provider, please page the [facility] (Director on Call) for the Hospitalists listed on amion for assistance.   06/21/2020, 4:22 PM

## 2020-06-21 NOTE — Discharge Instructions (Signed)
Tunneled Catheter Insertion, Care After This sheet gives you information about how to care for yourself after your procedure. Your health care provider may also give you more specific instructions. If you have problems or questions, contact your health care provider. What can I expect after the procedure? After the procedure, it is common to have:  Some mild redness, bruising, swelling, and pain around your catheter site.  A small amount of blood or clear fluid coming from your incisions. Follow these instructions at home: Incision care   Follow instructions from your health care provider about how to take care of your incisions. Make sure you: ? Wash your hands with soap and water before and after you change your bandages (dressings). If soap and water are not available, use hand sanitizer. ? Change your dressings as told by your health care provider. Dialysis will change dressing. Wash the area around your incisions with a germ-killing (antiseptic) solution when you change your dressings. ? Leave stitches (sutures), skin glue, or adhesive strips in place. These skin closures may need to stay in place for 2 weeks or longer. If adhesive strip edges start to loosen and curl up, you may trim the loose edges. Do not remove adhesive strips completely unless your health care provider tells you to do that.  Keep your dressings clean and dry.  Check your incision areas every day for signs of infection. Check for: ? More redness, swelling, or pain. ? More fluid or blood. ? Warmth. ? Pus or a bad smell. Catheter care   Wash your hands with soap and water before and after caring for your catheter. If soap and water are not available, use hand sanitizer.  Keep your catheter site clean and dry.  Apply an antibiotic ointment to your catheter site as told by your health care provider.  Flush your catheter as told by your health care provider. This helps prevent it from becoming clogged.  Do not  open the caps on the ends of the catheter.  Do not pull on your catheter. Medicines  Take over-the-counter and prescription medicines only as told by your health care provider.  If you were prescribed an antibiotic medicine, take it as told by your health care provider. Do not stop taking the antibiotic even if you start to feel better. Activity  Return to your normal activities as told by your health care provider. Ask your health care provider what activities are safe for you.  Follow any other activity restrictions as instructed by your health care provider.  Do not lift anything that is heavier than 10 lb (4.5 kg), or the limit that you are told, until your health care provider says that it is safe. Driving  Do not drive until your health care provider approves.  Ask your health care provider if the medicine prescribed to you requires you to avoid driving or using heavy machinery. General instructions  Follow your health care provider's specific instructions for the type of catheter that you have.  Do not take baths, swim, or use a hot tub until your health care provider approves. Ask your health care provider if you may take showers.  Keep all follow-up visits as told by your health care provider. This is important. Contact a health care provider if:  You feel unusually weak or nauseous.  You have more redness, swelling, or pain at your incisions or around the area where your catheter is inserted.  Your catheter is not working properly.  You are unable  to flush your catheter. Get help right away if:  Your catheter develops a hole or it breaks.  You have pain or swelling when fluids or medicines are being given through the catheter.  Fluid is leaking from the catheter, under the dressing, or around the dressing.  Your catheter comes loose or gets pulled completely out. If this happens, press on your catheter site firmly with a clean cloth until you can get medical  help.  You have swelling in your shoulder, neck, chest, or face.  You have chest pain or difficulty breathing.  You feel dizzy or light-headed.  You have pus or a bad smell coming from your catheter site.  You have a fever or chills.  Your catheter site feels warm to the touch.  You develop bleeding from your catheter or your insertion site, and your bleeding does not stop. Summary  After the procedure, it is common to have mild redness, swelling, and pain around your catheter site.  Return to your normal activities as told by your health care provider. Ask your health care provider what activities are safe for you.  Follow your health care provider's specific instructions for the type of catheter that you have.  Keep your catheter site and your dressings clean and dry.  Contact a health care provider if your catheter is not working properly. Get help right away if you have chest pain, fever, or difficulty breathing. This information is not intended to replace advice given to you by your health care provider. Make sure you discuss any questions you have with your health care provider. Document Revised: 09/06/2018 Document Reviewed: 09/06/2018 Elsevier Patient Education  [place].

## 2020-06-21 NOTE — Progress Notes (Signed)
Called to consider HD for this patient, she has a slightly bigger pleural effusion on CXR done after new TDC placement and fistulogram. Pt asymptomatic, no hypoxia/ SOB, labs are all good.  Does not need emergent HD here, have d/w [facility] staff , she has her OP dialysis tomorrow at 11:00 am. Do not recommend HD here today. Have d/w Dr Lorin Mercy and Dr Oneida Alar.   Kelly Splinter, MD 06/21/2020, 3:27 PM

## 2020-06-21 NOTE — Op Note (Signed)
Procedure: 1.  Ultrasound left arm, left arm fistulogram  2.  Insertion of left internal jugular vein palindrome catheter 19 cm  Preoperative diagnosis: 1.  Poorly functioning left arm AV fistula  2.  End-stage renal disease  Most operative diagnosis: Same  Anesthesia: Local with IV sedation sedation time 12 minutes  Operative findings: 1.  Kink with retained valve midsection left arm AV fistula, large sidebranch proximal left arm AV fistula, no significant inflow stenosis  2.  19 cm palindrome catheter  Operative details: After obtaining form consent, the patient taken the PV lab.  The patient placed supine position angio table.  Patient's left upper extremities prepped and draped in usual sterile fashion.  Ultrasound was used to identify the patient's left upper arm AV fistula.  This was about 10 mm in diameter.  Local anesthesia was infiltrated over this.  Micropuncture needle was used to cannulate the fistula and the micropuncture wire advanced into it.  Micropuncture sheath then placed over this.  Contrast angiogram was then obtained through the micropuncture sheath.  Central veins are widely patent.  The fistula is about 10 to 12 mm in diameter throughout its course.  There is a kink or retained valve in the mid section of the fistula.  Additionally there is a large side branch about 5 mm in diameter coming off the proximal aspect of the fistula.  There did not appear to be any inflow stenosis.  At this point the micropuncture sheath was removed and the hole repaired with a single figure-of-eight Monocryl stitch.  Attention was then turned to the patient's left chest.  The entire left chest and neck were prepped and draped in usual sterile fashion as well as a pre-existing left internal jugular vein palindrome catheter.  Catheter was inspected under fluoroscopy found to have no kinks throughout its course and the tip in the right atrium.  At this point sterile prep and drape of the left neck  and chest were performed.  The sutures were clipped on the old palindrome catheter.  The new palindrome catheter had the obturators placed in each port an 035 Amplatz wire was advanced through the old palindrome catheter and this was removed with gentle traction.  The new catheter was then advanced over the guidewire into the right atrium.  The obturators were removed from the catheter as well as the guidewire.  Both ports were noted to flush and draw easily.  The catheter was sutured the skin with nylon sutures.  Contrast angiogram showed that the catheter tip was in the right atrium.  Catheter was loaded concentrated heparin solution.  Patient tired procedure well and there were no complications.  Patient was taken the holding in stable condition.  Operative management: The patient will be scheduled for revision of her left arm AV fistula with patch angioplasty or shortening of the kinked segment as well as sidebranch ligation and possible Superficialization.  Her catheter is ready for use at this point.  Ruta Hinds, MD [facility] Office: [phone]

## 2020-06-22 DIAGNOSIS — N186 End stage renal disease: Secondary | ICD-10-CM | POA: Diagnosis not present

## 2020-06-22 DIAGNOSIS — Z992 Dependence on renal dialysis: Secondary | ICD-10-CM | POA: Diagnosis not present

## 2020-06-22 DIAGNOSIS — E8779 Other fluid overload: Secondary | ICD-10-CM | POA: Diagnosis not present

## 2020-06-24 ENCOUNTER — Encounter (HOSPITAL_COMMUNITY): Payer: Self-pay | Admitting: Vascular Surgery

## 2020-06-24 MED FILL — Heparin Sodium (Porcine) Inj 1000 Unit/ML: INTRAMUSCULAR | Qty: 10 | Status: AC

## 2020-06-25 DIAGNOSIS — Z992 Dependence on renal dialysis: Secondary | ICD-10-CM | POA: Diagnosis not present

## 2020-06-25 DIAGNOSIS — E8779 Other fluid overload: Secondary | ICD-10-CM | POA: Diagnosis not present

## 2020-06-25 DIAGNOSIS — N2581 Secondary hyperparathyroidism of renal origin: Secondary | ICD-10-CM | POA: Diagnosis not present

## 2020-06-25 DIAGNOSIS — N186 End stage renal disease: Secondary | ICD-10-CM | POA: Diagnosis not present

## 2020-06-27 DIAGNOSIS — N186 End stage renal disease: Secondary | ICD-10-CM | POA: Diagnosis not present

## 2020-06-27 DIAGNOSIS — Z992 Dependence on renal dialysis: Secondary | ICD-10-CM | POA: Diagnosis not present

## 2020-06-29 DIAGNOSIS — N186 End stage renal disease: Secondary | ICD-10-CM | POA: Diagnosis not present

## 2020-06-29 DIAGNOSIS — Z992 Dependence on renal dialysis: Secondary | ICD-10-CM | POA: Diagnosis not present

## 2020-06-29 DIAGNOSIS — T82898A Other specified complication of vascular prosthetic devices, implants and grafts, initial encounter: Secondary | ICD-10-CM | POA: Diagnosis not present

## 2020-07-01 DIAGNOSIS — N185 Chronic kidney disease, stage 5: Secondary | ICD-10-CM | POA: Diagnosis not present

## 2020-07-01 DIAGNOSIS — G894 Chronic pain syndrome: Secondary | ICD-10-CM | POA: Diagnosis not present

## 2020-07-01 DIAGNOSIS — J309 Allergic rhinitis, unspecified: Secondary | ICD-10-CM | POA: Diagnosis not present

## 2020-07-01 DIAGNOSIS — E1129 Type 2 diabetes mellitus with other diabetic kidney complication: Secondary | ICD-10-CM | POA: Diagnosis not present

## 2020-07-01 DIAGNOSIS — Z23 Encounter for immunization: Secondary | ICD-10-CM | POA: Diagnosis not present

## 2020-07-02 DIAGNOSIS — Z992 Dependence on renal dialysis: Secondary | ICD-10-CM | POA: Diagnosis not present

## 2020-07-02 DIAGNOSIS — N186 End stage renal disease: Secondary | ICD-10-CM | POA: Diagnosis not present

## 2020-07-02 DIAGNOSIS — T82898A Other specified complication of vascular prosthetic devices, implants and grafts, initial encounter: Secondary | ICD-10-CM | POA: Diagnosis not present

## 2020-07-04 DIAGNOSIS — Z992 Dependence on renal dialysis: Secondary | ICD-10-CM | POA: Diagnosis not present

## 2020-07-04 DIAGNOSIS — N186 End stage renal disease: Secondary | ICD-10-CM | POA: Diagnosis not present

## 2020-07-04 DIAGNOSIS — T82898A Other specified complication of vascular prosthetic devices, implants and grafts, initial encounter: Secondary | ICD-10-CM | POA: Diagnosis not present

## 2020-07-05 ENCOUNTER — Inpatient Hospital Stay (HOSPITAL_COMMUNITY): Admission: RE | Admit: 2020-07-05 | Payer: Medicare Other | Source: Ambulatory Visit | Admitting: Internal Medicine

## 2020-07-09 ENCOUNTER — Other Ambulatory Visit: Payer: Self-pay

## 2020-07-09 DIAGNOSIS — N186 End stage renal disease: Secondary | ICD-10-CM | POA: Diagnosis not present

## 2020-07-09 DIAGNOSIS — D509 Iron deficiency anemia, unspecified: Secondary | ICD-10-CM | POA: Diagnosis not present

## 2020-07-09 DIAGNOSIS — E119 Type 2 diabetes mellitus without complications: Secondary | ICD-10-CM | POA: Diagnosis not present

## 2020-07-09 DIAGNOSIS — T82898A Other specified complication of vascular prosthetic devices, implants and grafts, initial encounter: Secondary | ICD-10-CM | POA: Diagnosis not present

## 2020-07-09 DIAGNOSIS — Z992 Dependence on renal dialysis: Secondary | ICD-10-CM | POA: Diagnosis not present

## 2020-07-09 DIAGNOSIS — N2581 Secondary hyperparathyroidism of renal origin: Secondary | ICD-10-CM | POA: Diagnosis not present

## 2020-07-09 MED ORDER — SODIUM CHLORIDE 0.9 % IV SOLN: 250.0000 mL | INTRAVENOUS | Status: AC | PRN

## 2020-07-09 MED ORDER — SODIUM CHLORIDE 0.9% FLUSH: 3.0000 mL | Freq: Two times a day (BID) | INTRAVENOUS | Status: AC

## 2020-07-09 MED ORDER — SODIUM CHLORIDE 0.9% FLUSH: 3.0000 mL | INTRAVENOUS | Status: AC | PRN

## 2020-07-13 DIAGNOSIS — T82898A Other specified complication of vascular prosthetic devices, implants and grafts, initial encounter: Secondary | ICD-10-CM | POA: Diagnosis not present

## 2020-07-13 DIAGNOSIS — Z992 Dependence on renal dialysis: Secondary | ICD-10-CM | POA: Diagnosis not present

## 2020-07-13 DIAGNOSIS — N186 End stage renal disease: Secondary | ICD-10-CM | POA: Diagnosis not present

## 2020-07-16 DIAGNOSIS — T82898A Other specified complication of vascular prosthetic devices, implants and grafts, initial encounter: Secondary | ICD-10-CM | POA: Diagnosis not present

## 2020-07-16 DIAGNOSIS — Z992 Dependence on renal dialysis: Secondary | ICD-10-CM | POA: Diagnosis not present

## 2020-07-16 DIAGNOSIS — N186 End stage renal disease: Secondary | ICD-10-CM | POA: Diagnosis not present

## 2020-07-20 DIAGNOSIS — N186 End stage renal disease: Secondary | ICD-10-CM | POA: Diagnosis not present

## 2020-07-20 DIAGNOSIS — Z992 Dependence on renal dialysis: Secondary | ICD-10-CM | POA: Diagnosis not present

## 2020-07-20 DIAGNOSIS — T82898A Other specified complication of vascular prosthetic devices, implants and grafts, initial encounter: Secondary | ICD-10-CM | POA: Diagnosis not present

## 2020-07-22 ENCOUNTER — Other Ambulatory Visit (HOSPITAL_COMMUNITY): Payer: Self-pay | Admitting: Internal Medicine

## 2020-07-22 DIAGNOSIS — R0600 Dyspnea, unspecified: Secondary | ICD-10-CM

## 2020-07-22 DIAGNOSIS — E1129 Type 2 diabetes mellitus with other diabetic kidney complication: Secondary | ICD-10-CM | POA: Diagnosis not present

## 2020-07-22 DIAGNOSIS — I10 Essential (primary) hypertension: Secondary | ICD-10-CM | POA: Diagnosis not present

## 2020-07-22 DIAGNOSIS — J9801 Acute bronchospasm: Secondary | ICD-10-CM | POA: Diagnosis not present

## 2020-07-23 ENCOUNTER — Other Ambulatory Visit (HOSPITAL_COMMUNITY): Payer: Medicare Other

## 2020-07-25 DIAGNOSIS — T82898A Other specified complication of vascular prosthetic devices, implants and grafts, initial encounter: Secondary | ICD-10-CM | POA: Diagnosis not present

## 2020-07-25 DIAGNOSIS — Z992 Dependence on renal dialysis: Secondary | ICD-10-CM | POA: Diagnosis not present

## 2020-07-25 DIAGNOSIS — N186 End stage renal disease: Secondary | ICD-10-CM | POA: Diagnosis not present

## 2020-07-30 DIAGNOSIS — Z992 Dependence on renal dialysis: Secondary | ICD-10-CM | POA: Diagnosis not present

## 2020-07-30 DIAGNOSIS — N186 End stage renal disease: Secondary | ICD-10-CM | POA: Diagnosis not present

## 2020-08-02 DIAGNOSIS — T82898A Other specified complication of vascular prosthetic devices, implants and grafts, initial encounter: Secondary | ICD-10-CM | POA: Diagnosis not present

## 2020-08-02 DIAGNOSIS — N186 End stage renal disease: Secondary | ICD-10-CM | POA: Diagnosis not present

## 2020-08-02 DIAGNOSIS — Z992 Dependence on renal dialysis: Secondary | ICD-10-CM | POA: Diagnosis not present

## 2020-08-03 DIAGNOSIS — Z992 Dependence on renal dialysis: Secondary | ICD-10-CM | POA: Diagnosis not present

## 2020-08-03 DIAGNOSIS — N186 End stage renal disease: Secondary | ICD-10-CM | POA: Diagnosis not present

## 2020-08-06 DIAGNOSIS — N186 End stage renal disease: Secondary | ICD-10-CM | POA: Diagnosis not present

## 2020-08-06 DIAGNOSIS — Z992 Dependence on renal dialysis: Secondary | ICD-10-CM | POA: Diagnosis not present

## 2020-08-06 DIAGNOSIS — D509 Iron deficiency anemia, unspecified: Secondary | ICD-10-CM | POA: Diagnosis not present

## 2020-08-13 DIAGNOSIS — Z992 Dependence on renal dialysis: Secondary | ICD-10-CM | POA: Diagnosis not present

## 2020-08-13 DIAGNOSIS — N186 End stage renal disease: Secondary | ICD-10-CM | POA: Diagnosis not present

## 2020-08-17 DIAGNOSIS — N186 End stage renal disease: Secondary | ICD-10-CM | POA: Diagnosis not present

## 2020-08-17 DIAGNOSIS — Z992 Dependence on renal dialysis: Secondary | ICD-10-CM | POA: Diagnosis not present

## 2020-08-20 DIAGNOSIS — N186 End stage renal disease: Secondary | ICD-10-CM | POA: Diagnosis not present

## 2020-08-20 DIAGNOSIS — N2581 Secondary hyperparathyroidism of renal origin: Secondary | ICD-10-CM | POA: Diagnosis not present

## 2020-08-20 DIAGNOSIS — Z992 Dependence on renal dialysis: Secondary | ICD-10-CM | POA: Diagnosis not present

## 2020-08-21 ENCOUNTER — Other Ambulatory Visit: Payer: Self-pay

## 2020-08-21 ENCOUNTER — Encounter (HOSPITAL_COMMUNITY): Payer: Self-pay | Admitting: Vascular Surgery

## 2020-08-21 NOTE — Progress Notes (Signed)
PCP - Dr Gerarda Fraction Cardiologist - Dr Rozann Lesches  Chest x-ray - 06/26/20 (1V) EKG - 03/23/20 Stress Test - 03/20/14 ECHO - 04/29/20 Cardiac Cath - 05/25/17  Anesthesia review: Yes  STOP now taking any Aspirin (unless otherwise instructed by your surgeon), Aleve, Naproxen, Ibuprofen, Motrin, Advil, Goody's, BC's, all herbal medications, fish oil, and all vitamins.   Coronavirus Screening Covid test is scheduled on 08/23/20. Do you have any of the following symptoms:  Cough yes/no: No Fever (>100.55F)  yes/no: No Runny nose yes/no: No Sore throat yes/no: No Difficulty breathing/shortness of breath  yes/no: No  Have you traveled in the last 14 days and where? yes/no: No  Patient verbalized understanding of instructions that were given via phone.

## 2020-08-23 ENCOUNTER — Other Ambulatory Visit (HOSPITAL_COMMUNITY): Payer: Medicare Other

## 2020-08-23 ENCOUNTER — Encounter (HOSPITAL_COMMUNITY): Payer: Self-pay | Admitting: Emergency Medicine

## 2020-08-23 NOTE — Anesthesia Preprocedure Evaluation (Deleted)
Anesthesia Evaluation  Patient identified by MRN, date of birth, ID band Patient awake    Reviewed: Allergy & Precautions, NPO status , Patient's Chart, lab work & pertinent test results  Airway Mallampati: II  TM Distance: >3 FB Neck ROM: Full    Dental no notable dental hx.    Pulmonary sleep apnea , former smoker,    Pulmonary exam normal breath sounds clear to auscultation       Cardiovascular hypertension, negative cardio ROS Normal cardiovascular exam Rhythm:Regular Rate:Normal     Neuro/Psych Anxiety negative neurological ROS  negative psych ROS   GI/Hepatic Neg liver ROS, GERD  ,  Endo/Other  negative endocrine ROSdiabetes, Type 2  Renal/GU negative Renal ROS  negative genitourinary   Musculoskeletal  (+) Arthritis , Osteoarthritis,    Abdominal   Peds negative pediatric ROS (+)  Hematology negative hematology ROS (+)   Anesthesia Other Findings   Reproductive/Obstetrics negative OB ROS                            Anesthesia Physical Anesthesia Plan  ASA: 2  Anesthesia Plan: General   Post-op Pain Management: Dilaudid IV   Induction: Intravenous  PONV Risk Score and Plan: 2 and Ondansetron, Midazolam and Treatment may vary due to age or medical condition  Airway Management Planned: Oral ETT  Additional Equipment:   Intra-op Plan:   Post-operative Plan: Extubation in OR  Informed Consent: I have reviewed the patients History and Physical, chart, labs and discussed the procedure including the risks, benefits and alternatives for the proposed anesthesia with the patient or authorized representative who has indicated his/her understanding and acceptance.     Dental advisory given  Plan Discussed with: CRNA  Anesthesia Plan Comments: (See APP note by A. Sherese Heyward, FNP )       Anesthesia Quick Evaluation  

## 2020-08-23 NOTE — Progress Notes (Signed)
Anesthesia Chart Review:  Pt is a same day work up   Case: [id number] Date/Time: 08/26/20 0715   Procedure: LEFT REVISON OF ARTERIOVENOUS FISTULA, SIDEBRANCH LIGATION, POSSIBLE SUPERFICIALIZATION (Left )   Anesthesia type: Choice   Pre-op diagnosis: ESRD   [facility] RO[facility] / [facility]   Surgeons: Elam Dutch, MD      DISCUSSION:  Pt is 54 years old with hx CAD (100% distal PDA by 2018 cath- med mgmt recommended), nonischemic cardiomyopathy (EF 40%), HTN, ESRD on hemodialysis, ITP, PE, stroke, asthma  - Hospitalized 6/26-6/28/21 for acute combined systolic / diastolic heart failure, acute respiratory failure with hypoxia, CAP   PROVIDERS: - PCP is Redmond School, MD - Cardiologist is Rozann Lesches, MD. Last virtual office visit 05/21/20   LABS: Will be obtained day of surgery   IMAGES: 1 view CXR 06/21/20:  1. Left IJ dialysis catheter in good position. 2. Enlarging left pleural effusion since yesterday's film, now half way up the chest.   EKG 03/23/20: Sinus tachycardia with LBBB   CV: Echocardiogram 04/29/2020: 1. Left ventricular ejection fraction, by estimation, is 40%. The left  ventricle has mildly decreased function. The left ventricle demonstrates global hypokinesis. There is mild left ventricular hypertrophy. Left ventricular diastolic parameters are indeterminate.  2. Right ventricular systolic function is normal. The right ventricular  size is normal. There is mildly elevated pulmonary artery systolic  pressure.  3. Left atrial size was severely dilated.  4. Right atrial size was moderately dilated.  5. The mitral valve is abnormal. Moderate mitral valve regurgitation. No evidence of mitral stenosis.  6. The aortic valve is tricuspid. Aortic valve regurgitation is not visualized. No aortic stenosis is present.  7. There is moderate pulmonary HTN, PASP is 41 mmHg.  8. The inferior vena cava is dilated in size with >50% respiratory variability, suggesting  right atrial pressure of 8 mmHg.   Nuclear stress test 01/12/19:   Defect 1: There is a small defect present in the apical anterior location.  Findings consistent with prior myocardial infarction.  This is an intermediate risk study.  The left ventricular ejection fraction is moderately decreased (30-44%). -There is a small area of irreversible defect in the apical anterior wall consistent with prior infarct and no ischemia. LVEF 37% with diffuse hypokinesis.    Cardiac cath 05/20/17:   RPDA lesion, 100 %stenosed.  The left ventricular systolic function is normal.  LV end diastolic pressure is normal.  The left ventricular ejection fraction is 55-65% by visual estimate. 1. 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 Otherwise normal coronary arteries. 3. Normal LV function with very small focal area of distal inferior hypokinesis. - Plan: medical therapy.   Past Medical History:  Diagnosis Date  . Allergy   . Anxiety   . Arthritis   . Asthma   . CAD (coronary artery disease), native coronary artery    a. cath 05/20/17 - 100% distal PDA --> medical mangement (no intervention done)  . Candida esophagitis ([place])   . Chronic combined systolic and diastolic CHF (congestive heart failure) ([place])   . Depression   . End stage renal disease on dialysis ([place])    T/ Sat - [facility]  . Essential hypertension   . GERD (gastroesophageal reflux disease)   . Gout   . Headache   . Hemorrhoids   . History of kidney stones    passed stones  . ITP (idiopathic thrombocytopenic purpura) 08/2010   Treated with  Prednisone, IVIg (transient response), Rituxan (no response), Cytoxan (no response).  Last was Prednisone $RemoveBefore'40mg'BbSsrCEYqYPam$ ; 2 wk in 10/2012.  She also was on Prednisone bridged with Cellcept briefly but stopped due to lack of insurance.   . Myocardial infarction ([place])   . Perinephric hematoma   . Pneumonia    x 2  . Pulmonary embolism ([place]) 04/2012  . Serrated adenoma of colon   .  Steroid-induced diabetes ([place])    Prednisone - resolved per patient 08/21/20  . Stroke [facility])     Past Surgical History:  Procedure Laterality Date  . A/V FISTULAGRAM Left 06/21/2020   Procedure: A/V FISTULAGRAM;  Surgeon: Elam Dutch, MD;  Location: [facility];  Service: Cardiovascular;  Laterality: Left;  . AV FISTULA PLACEMENT Right 01/23/2019   Procedure: ARTERIOVENOUS (AV) FISTULA CREATION RIGHT ARM;  Surgeon: Elam Dutch, MD;  Location: [facility] OR;  Service: Vascular;  Laterality: Right;  . AV FISTULA PLACEMENT Right 01/30/2019   Procedure: GORETEX GRAFT PLACEMENT  RIGHT ARM;  Surgeon: Elam Dutch, MD;  Location: [facility] OR;  Service: Vascular;  Laterality: Right;  . AV FISTULA PLACEMENT Left 08/07/2019   Procedure: LEFT BRACHIOCEPHALIC VENOUS FISTULA;  Surgeon: Elam Dutch, MD;  Location: [facility];  Service: Vascular;  Laterality: Left;  . BONE MARROW BIOPSY    . CARDIAC CATHETERIZATION    . CARPAL TUNNEL RELEASE    . CARPAL TUNNEL RELEASE Left 05/20/2016   Procedure: CARPAL TUNNEL RELEASE;  Surgeon: Carole Civil, MD;  Location: [facility];  Service: Orthopedics;  Laterality: Left;  . CARPAL TUNNEL RELEASE Right 07/16/2016   Procedure: RIGHT CARPAL TUNNEL RELEASE;  Surgeon: Carole Civil, MD;  Location: [facility];  Service: Orthopedics;  Laterality: Right;  . CATARACT EXTRACTION    . CHOLECYSTECTOMY  2008  . COLONOSCOPY WITH ESOPHAGOGASTRODUODENOSCOPY (EGD) N/A 01/04/2013   Dr. Gala Romney: esophageal plaques with +KOH, hh. Gastric antrum abnormal, bx reactive gastropathy. Anal canal hemorrhoids, colonic tics, normal TI, single polyp (sessile serrated tubular adenoma). Next TCS 12/2017  . IR FLUORO GUIDE CV LINE RIGHT  01/10/2019  . IR US GUIDE VASC ACCESS RIGHT  01/10/2019  . LEFT HEART CATH AND CORONARY ANGIOGRAPHY N/A 05/20/2017   Procedure: LEFT HEART CATH AND CORONARY ANGIOGRAPHY;  Surgeon: Martinique, Peter M, MD;  Location: [facility];  Service: Cardiovascular;   Laterality: N/A;  . 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 TEE WITHOUT CARDIOVERSION N/A 01/03/2016   Procedure: TRANSESOPHAGEAL ECHOCARDIOGRAM (TEE) WITH PROPOFOL;  Surgeon: Satira Sark, MD;  [facility];  Service: Cardiovascular;  Laterality: N/A;  . TEMPORARY DIALYSIS CATHETER N/A 06/21/2020   Procedure: TEMPORARY DIALYSIS CATHETER;  Surgeon: Elam Dutch, MD;  Location: [facility];  Service: Cardiovascular;  Laterality: N/A;    MEDICATIONS: . 0.9 %  sodium chloride infusion  . 0.9 %  sodium chloride infusion  . sodium chloride flush (NS) 0.9 % injection 3 mL  . sodium chloride flush (NS) 0.9 % injection 3 mL   . acetaminophen (TYLENOL) 500 MG tablet  . albuterol (PROVENTIL) (2.5 MG/3ML) 0.083% nebulizer solution  . calcium acetate (PHOSLO) 667 MG capsule  . diclofenac Sodium (VOLTAREN) 1 % GEL  . furosemide (LASIX) 80 MG tablet  . hydrALAZINE (APRESOLINE) 50 MG tablet  . isosorbide dinitrate (ISORDIL) 10 MG tablet  . loratadine (CLARITIN) 10 MG tablet  . metoprolol succinate (TOPROL-XL) 50 MG 24 hr tablet  . midodrine (PROAMATINE) 5 MG tablet  .  nitroGLYCERIN (NITROSTAT) 0.4 MG SL tablet  . OVER THE COUNTER MEDICATION  . Oxycodone HCl 10 MG TABS  . PROAIR HFA 108 (90 Base) MCG/ACT inhaler  . sertraline (ZOLOFT) 100 MG tablet  . lidocaine-prilocaine (EMLA) cream   . 0.9 %  sodium chloride infusion    If labs acceptable day of surgery, I anticipate pt can proceed with surgery as scheduled.   Willeen Cass, PhD, FNP-BC [facility]Anesthesiology Phone: [phone] 08/23/2020 3:31 PM

## 2020-08-24 DIAGNOSIS — N186 End stage renal disease: Secondary | ICD-10-CM | POA: Diagnosis not present

## 2020-08-24 DIAGNOSIS — Z992 Dependence on renal dialysis: Secondary | ICD-10-CM | POA: Diagnosis not present

## 2020-08-26 ENCOUNTER — Ambulatory Visit (HOSPITAL_COMMUNITY): Admission: RE | Admit: 2020-08-26 | Payer: Medicare Other | Source: Home / Self Care | Admitting: Vascular Surgery

## 2020-08-26 SURGERY — REVISON OF ARTERIOVENOUS FISTULA
Anesthesia: Choice | Laterality: Left

## 2020-08-26 NOTE — Progress Notes (Addendum)
Patient did not arrive at 0530 for surgery.  Reached to patient with no success.  Contacted patient's daughter and daughter stated that patient would not be coming in for surgery this morning.  OR desk aware.  Dr. Oneida Alar paged, awaiting return call.     Update: Dr. Oneida Alar aware.

## 2020-08-27 DIAGNOSIS — N186 End stage renal disease: Secondary | ICD-10-CM | POA: Diagnosis not present

## 2020-08-27 DIAGNOSIS — Z992 Dependence on renal dialysis: Secondary | ICD-10-CM | POA: Diagnosis not present

## 2020-08-28 DIAGNOSIS — M48061 Spinal stenosis, lumbar region without neurogenic claudication: Secondary | ICD-10-CM | POA: Diagnosis not present

## 2020-08-28 DIAGNOSIS — J329 Chronic sinusitis, unspecified: Secondary | ICD-10-CM | POA: Diagnosis not present

## 2020-08-28 DIAGNOSIS — G894 Chronic pain syndrome: Secondary | ICD-10-CM | POA: Diagnosis not present

## 2020-08-28 DIAGNOSIS — K120 Recurrent oral aphthae: Secondary | ICD-10-CM | POA: Diagnosis not present

## 2020-09-03 DIAGNOSIS — N2581 Secondary hyperparathyroidism of renal origin: Secondary | ICD-10-CM | POA: Diagnosis not present

## 2020-09-07 DIAGNOSIS — Z992 Dependence on renal dialysis: Secondary | ICD-10-CM | POA: Diagnosis not present

## 2020-09-07 DIAGNOSIS — N186 End stage renal disease: Secondary | ICD-10-CM | POA: Diagnosis not present

## 2020-09-10 DIAGNOSIS — Z992 Dependence on renal dialysis: Secondary | ICD-10-CM | POA: Diagnosis not present

## 2020-09-10 DIAGNOSIS — N186 End stage renal disease: Secondary | ICD-10-CM | POA: Diagnosis not present

## 2020-09-17 DIAGNOSIS — Z992 Dependence on renal dialysis: Secondary | ICD-10-CM | POA: Diagnosis not present

## 2020-09-17 DIAGNOSIS — N2581 Secondary hyperparathyroidism of renal origin: Secondary | ICD-10-CM | POA: Diagnosis not present

## 2020-09-17 DIAGNOSIS — N186 End stage renal disease: Secondary | ICD-10-CM | POA: Diagnosis not present

## 2020-09-22 DIAGNOSIS — N186 End stage renal disease: Secondary | ICD-10-CM | POA: Diagnosis not present

## 2020-09-22 DIAGNOSIS — Z992 Dependence on renal dialysis: Secondary | ICD-10-CM | POA: Diagnosis not present

## 2020-09-24 DIAGNOSIS — N186 End stage renal disease: Secondary | ICD-10-CM | POA: Diagnosis not present

## 2020-09-24 DIAGNOSIS — Z992 Dependence on renal dialysis: Secondary | ICD-10-CM | POA: Diagnosis not present

## 2020-09-25 DIAGNOSIS — N185 Chronic kidney disease, stage 5: Secondary | ICD-10-CM | POA: Diagnosis not present

## 2020-09-25 DIAGNOSIS — E119 Type 2 diabetes mellitus without complications: Secondary | ICD-10-CM | POA: Diagnosis not present

## 2020-09-25 DIAGNOSIS — G894 Chronic pain syndrome: Secondary | ICD-10-CM | POA: Diagnosis not present

## 2020-09-26 DIAGNOSIS — N186 End stage renal disease: Secondary | ICD-10-CM | POA: Diagnosis not present

## 2020-09-26 DIAGNOSIS — Z992 Dependence on renal dialysis: Secondary | ICD-10-CM | POA: Diagnosis not present

## 2020-09-27 DIAGNOSIS — N186 End stage renal disease: Secondary | ICD-10-CM | POA: Diagnosis not present

## 2020-09-27 DIAGNOSIS — Z992 Dependence on renal dialysis: Secondary | ICD-10-CM | POA: Diagnosis not present

## 2020-09-29 IMAGING — US US RENAL
1 series · 14 of 25 positions shown · non-contrast
Comparison: Abdominal CT 12/31/2018

CLINICAL DATA: Acute kidney injury

EXAM:
RENAL / URINARY TRACT ULTRASOUND COMPLETE

[Series 1: us renal · 14 of 85 slices shown]
[im 1/85]
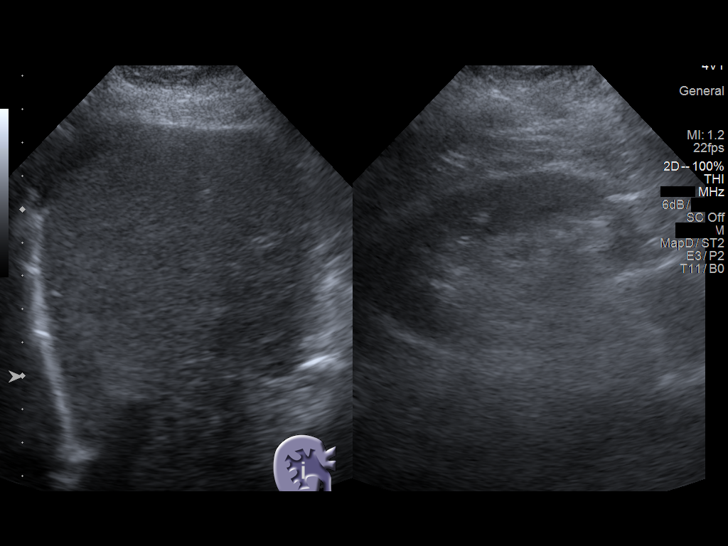
[im 8/85]
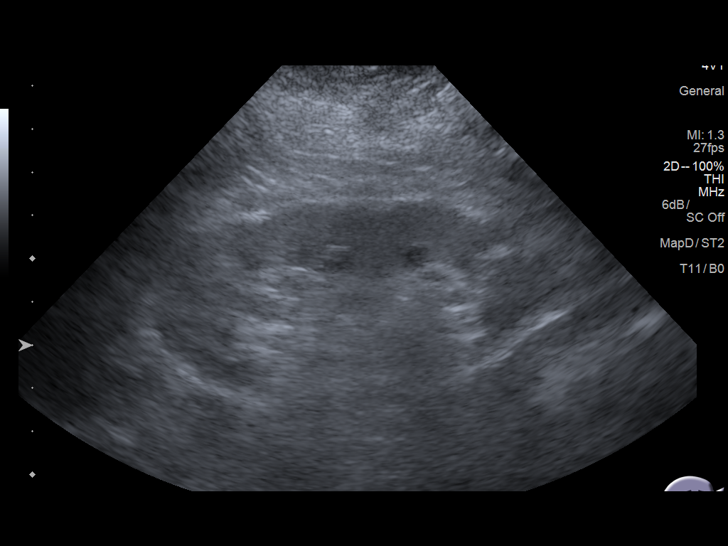
[im 15/85]
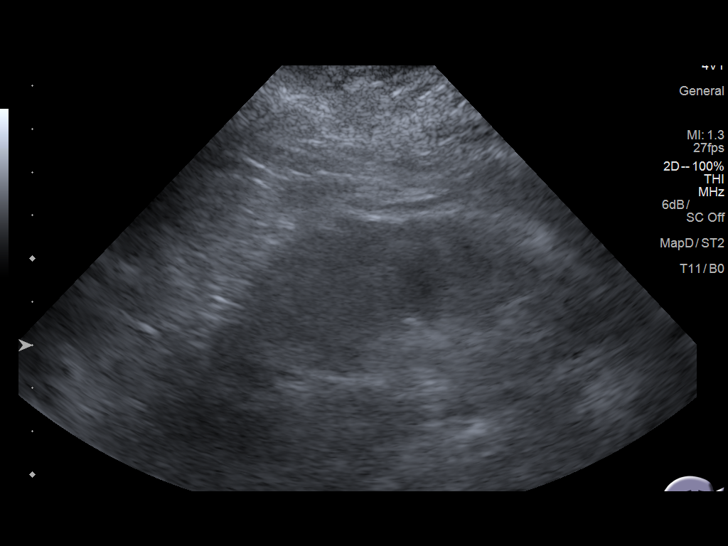
[im 22/85]
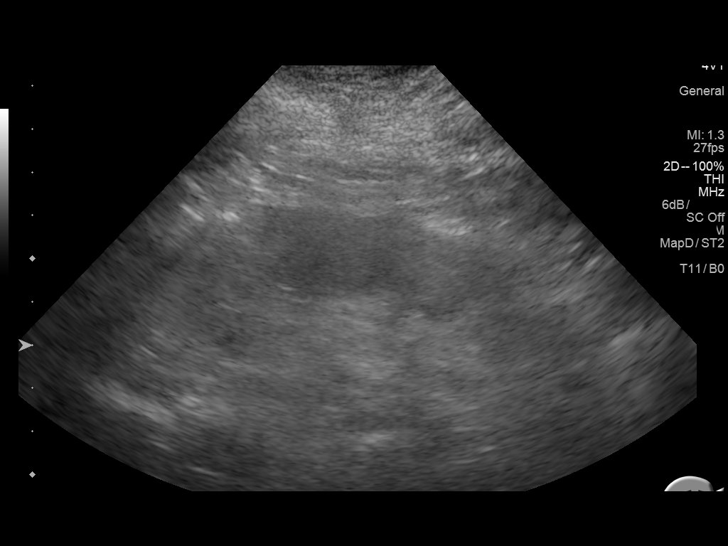
[im 29/85]
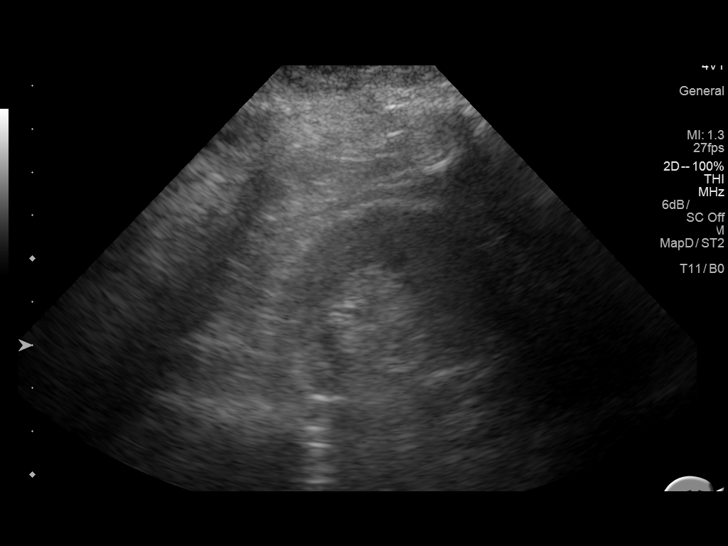
[im 32/85]
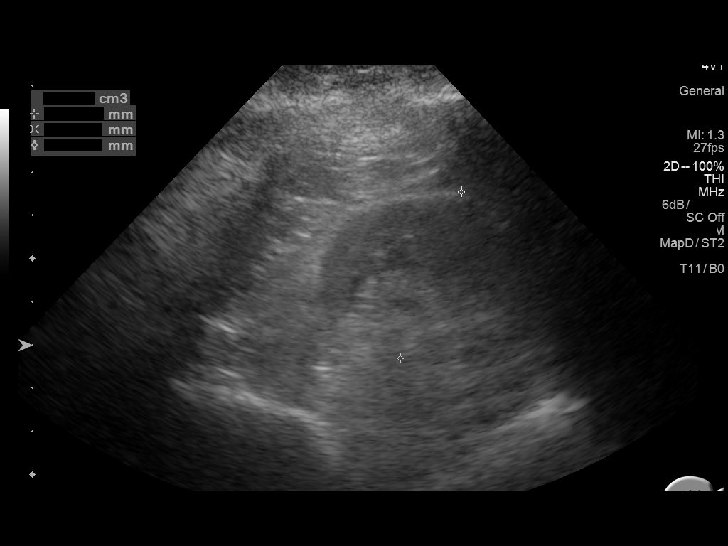
[im 39/85]
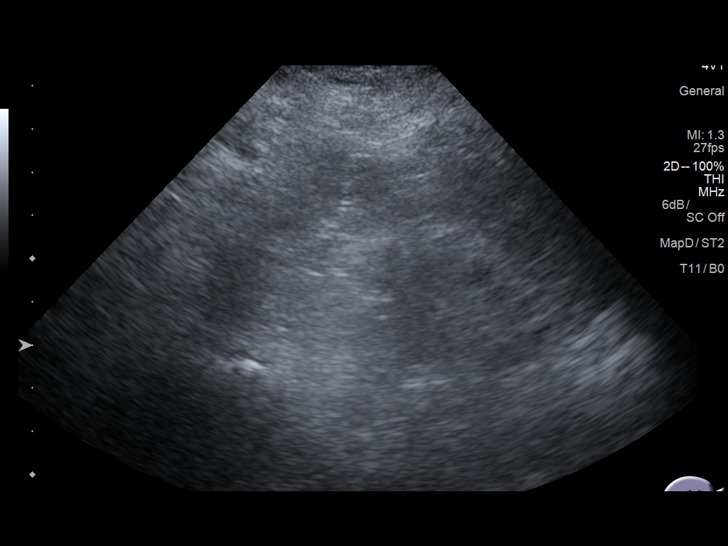
[im 46/85]
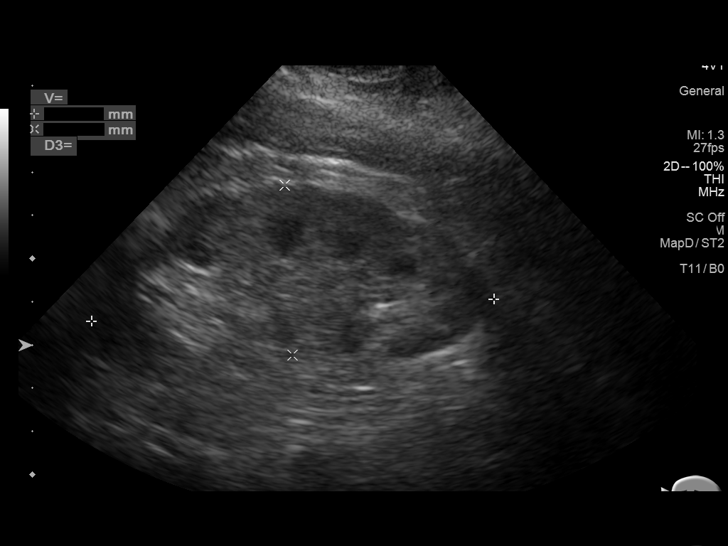
[im 53/85]
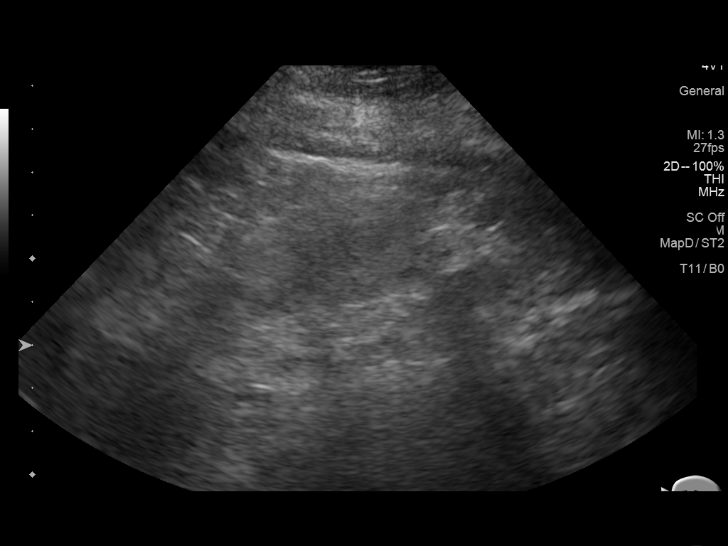
[im 57/85]
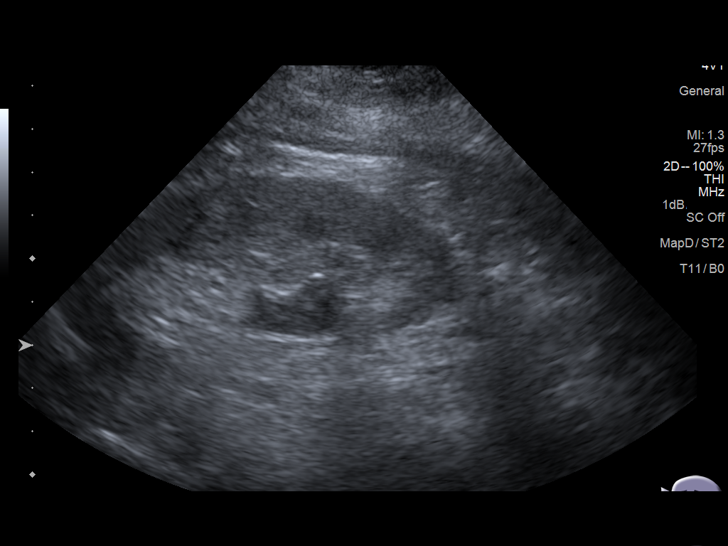
[im 64/85]
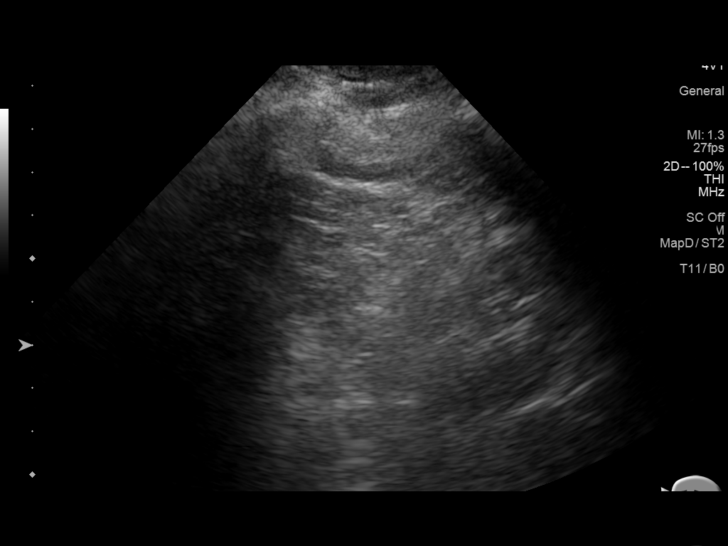
[im 71/85]
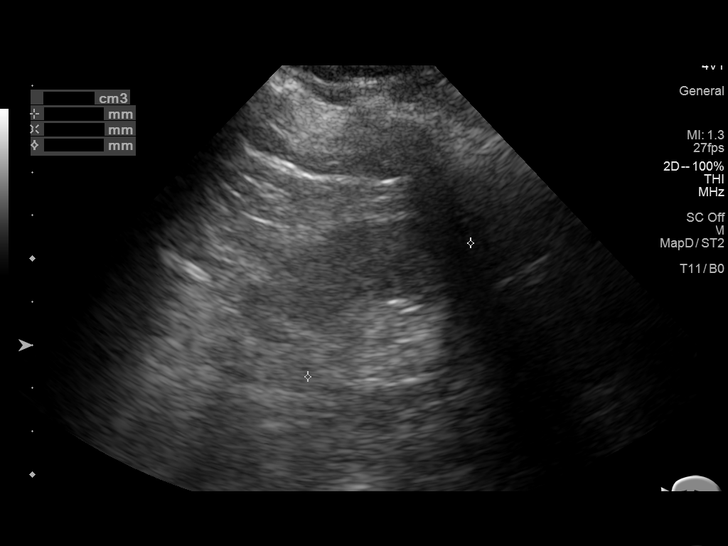
[im 78/85]
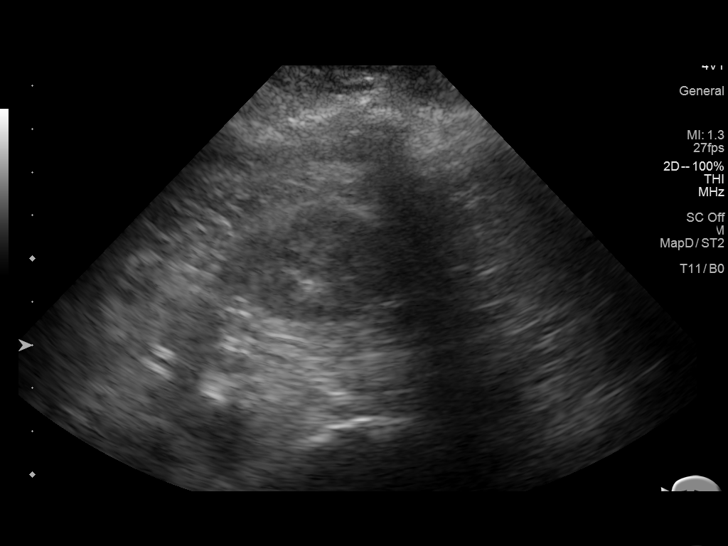
[im 85/85]
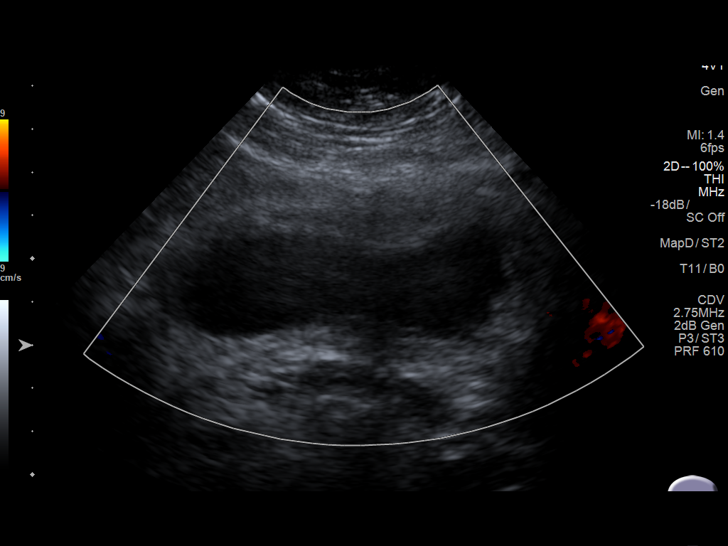

[14 of 25 positions shown; findings below may reference images not displayed]

FINDINGS: Right Kidney:

Renal measurements: 10 x 4 x 4 cm = volume: 91 mL. Increased
cortical echogenicity with prominent corticomedullary
differentiation. No hydronephrosis or mass.

Left Kidney:

Renal measurements: 9 x 3.9 x 4.4 cm = volume: 93 mL. Increased
cortical echogenicity. No hydronephrosis.

Bladder:

Appears normal for degree of bladder distention.
IMPRESSION: Medical renal disease without atrophy or hydronephrosis.

## 2020-09-29 NOTE — Progress Notes (Deleted)
Cardiology Office Note  Date: 09/29/2020   ID: PHILAMENA KRAMAR, DOB [birth year], MRN [id number]  PCP:  Redmond School, MD  Cardiologist:  Rozann Lesches, MD Electrophysiologist:  None   No chief complaint on file.   History of Present Illness: Summer Cook is a 55 y.o. female last assessed via telehealth encounter back in August 2021.  Past Medical History:  Diagnosis Date  . Allergy   . Anxiety   . Arthritis   . Asthma   . CAD (coronary artery disease), native coronary artery    a. cath 05/20/17 - 100% distal PDA --> medical mangement (no intervention done)  . Candida esophagitis ([place])   . Chronic combined systolic and diastolic CHF (congestive heart failure) ([place])   . Depression   . End stage renal disease on dialysis ([place])    T/ Sat - [facility]  . Essential hypertension   . GERD (gastroesophageal reflux disease)   . Gout   . Headache   . Hemorrhoids   . History of kidney stones    passed stones  . 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  Last was Prednisone $RemoveBefore'40mg'[id number]$ ; 2 wk in 10/2012.  She also was on Prednisone bridged with Cellcept briefly but stopped due to lack of insurance.   . Myocardial infarction ([place])   . Perinephric hematoma   . Pneumonia    x 2  . Pulmonary embolism ([place]) 04/2012  . Serrated adenoma of colon   . Steroid-induced diabetes ([place])    Prednisone - resolved per patient 08/21/20  . Stroke [facility])     Past Surgical History:  Procedure Laterality Date  . A/V FISTULAGRAM Left 06/21/2020   Procedure: A/V FISTULAGRAM;  Surgeon: Elam Dutch, MD;  Location: [place] CV LAB;  Service: Cardiovascular;  Laterality: Left;  . AV FISTULA PLACEMENT Right 01/23/2019   Procedure: ARTERIOVENOUS (AV) FISTULA CREATION RIGHT ARM;  Surgeon: Elam Dutch, MD;  Location: [facility] OR;  Service: Vascular;  Laterality: Right;  . AV FISTULA PLACEMENT Right  01/30/2019   Procedure: GORETEX GRAFT PLACEMENT  RIGHT ARM;  Surgeon: Elam Dutch, MD;  Location: [facility] OR;  Service: Vascular;  Laterality: Right;  . AV FISTULA PLACEMENT Left 08/07/2019   Procedure: LEFT BRACHIOCEPHALIC VENOUS FISTULA;  Surgeon: Elam Dutch, MD;  Location: [place];  Service: Vascular;  Laterality: Left;  . BONE MARROW BIOPSY    . CARDIAC CATHETERIZATION    . CARPAL TUNNEL RELEASE    . CARPAL TUNNEL RELEASE Left 05/20/2016   Procedure: CARPAL TUNNEL RELEASE;  Surgeon: Carole Civil, MD;  Location: [facility];  Service: Orthopedics;  Laterality: Left;  . CARPAL TUNNEL RELEASE Right 07/16/2016   Procedure: RIGHT CARPAL TUNNEL RELEASE;  Surgeon: Carole Civil, MD;  Location: [facility] OR[facility];  Service: Orthopedics;  Laterality: Right;  . CATARACT EXTRACTION    . CHOLECYSTECTOMY  2008  . COLONOSCOPY WITH ESOPHAGOGASTRODUODENOSCOPY (EGD) N/A 01/04/2013   Dr. Gala Romney: esophageal plaques with +KOH, hh. Gastric antrum abnormal, bx reactive gastropathy. Anal canal hemorrhoids, colonic tics, normal TI, single polyp (sessile serrated tubular adenoma). Next TCS 12/2017  . IR FLUORO GUIDE CV LINE RIGHT  01/10/2019  . IR US GUIDE VASC ACCESS RIGHT  01/10/2019  . LEFT HEART CATH AND CORONARY ANGIOGRAPHY N/A 05/20/2017   Procedure: LEFT HEART CATH AND CORONARY ANGIOGRAPHY;  Surgeon: Martinique, Peter M, MD;  Location: [place] [facility];  Service:  Cardiovascular;  Laterality: N/A;  . 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 TEE WITHOUT CARDIOVERSION N/A 01/03/2016   Procedure: TRANSESOPHAGEAL ECHOCARDIOGRAM (TEE) WITH PROPOFOL;  Surgeon: Jonelle Sidle, MD;  [facility];  Service: Cardiovascular;  Laterality: N/A;  . TEMPORARY DIALYSIS CATHETER N/A 06/21/2020   Procedure: TEMPORARY DIALYSIS CATHETER;  Surgeon: Sherren Kerns, MD;  [facility];  Service: Cardiovascular;  Laterality: N/A;    Current Outpatient Medications  Medication Sig Dispense Refill  . acetaminophen (TYLENOL)  500 MG tablet Take 1,000 mg by mouth every 6 (six) hours as needed for moderate pain.    Marland Kitchen albuterol (PROVENTIL) (2.5 MG/3ML) 0.083% nebulizer solution Take 3 mLs (2.5 mg total) by nebulization every 4 (four) hours as needed for shortness of breath. 75 mL 12  . calcium acetate (PHOSLO) 667 MG capsule Take 1,334 mg by mouth 3 (three) times daily with meals. Take 667 mg with snack    . diclofenac Sodium (VOLTAREN) 1 % GEL Apply 2 g topically daily as needed (Pain).    . furosemide (LASIX) 80 MG tablet Take 80 mg by mouth See admin instructions. Take 80 mg daily on non dialysis days - Monday, Wednesday, Thursday, Friday, Sunday    . hydrALAZINE (APRESOLINE) 50 MG tablet Take 50 mg by mouth 2 (two) times daily.    . isosorbide dinitrate (ISORDIL) 10 MG tablet Take 1 tablet (10 mg total) by mouth 2 (two) times daily. 180 tablet 3  . lidocaine-prilocaine (EMLA) cream Apply 1 application topically as needed (numbing).     Marland Kitchen loratadine (CLARITIN) 10 MG tablet Take 10 mg by mouth daily.     . metoprolol succinate (TOPROL-XL) 50 MG 24 hr tablet Take 50 mg by mouth at bedtime. Take with or immediately following a meal.     . midodrine (PROAMATINE) 5 MG tablet Take 5 mg by mouth daily as needed (for  low blood pressure.).     Marland Kitchen nitroGLYCERIN (NITROSTAT) 0.4 MG SL tablet Place 1 tablet (0.4 mg total) under the tongue every 5 (five) minutes x 3 doses as needed for chest pain (if no relief after 3rd dose, proceed to the ED for an evaluation or call 911). 25 tablet 3  . OVER THE COUNTER MEDICATION Place 1 drop into both eyes daily as needed (Dry eyes). Rohto eye drops    . Oxycodone HCl 10 MG TABS Take 1 tablet (10 mg total) by mouth every 6 (six) hours as needed. (Patient taking differently: Take 10 mg by mouth every 6 (six) hours as needed (Pain). ) 12 tablet 0  . PROAIR HFA 108 (90 Base) MCG/ACT inhaler Inhale 2 puffs into the lungs every 4 (four) hours as needed for shortness of breath.    . sertraline (ZOLOFT)  100 MG tablet Take 100 mg by mouth daily.     Current Facility-Administered Medications  Medication Dose Route Frequency Provider Last Rate Last Admin  . 0.9 %  sodium chloride infusion  250 mL Intravenous PRN Sherren Kerns, MD      . 0.9 %  sodium chloride infusion  250 mL Intravenous PRN Cephus Shelling, MD      . sodium chloride flush (NS) 0.9 % injection 3 mL  3 mL Intravenous Q12H Cephus Shelling, MD      . sodium chloride flush (NS) 0.9 % injection 3 mL  3 mL Intravenous PRN Cephus Shelling, MD       Facility-Administered Medications Ordered in Other  Visits  Medication Dose Route Frequency Provider Last Rate Last Admin  . 0.9 %  sodium chloride infusion    Continuous PRN Neldon Newport, CRNA   New Bag at 07/18/19 2993   Allergies:  Ciprofloxacin, Ceftriaxone, Dilaudid [hydromorphone hcl], Yellow jacket venom, Doxycycline, Adhesive [tape], Brintellix [vortioxetine], Cymbalta [duloxetine hcl], and Penicillins   Social History: The patient  reports that she quit smoking about 11 years ago. Her smoking use included cigarettes. She quit after 0.00 years of use. She quit smokeless tobacco use about 2 years ago.  Her smokeless tobacco use included snuff. She reports that she does not drink alcohol and does not use drugs.   Family History: The patient's family history includes AAA (abdominal aortic aneurysm) in her paternal grandmother; Barrett's esophagus in her paternal aunt; Breast cancer in her paternal grandmother; Cervical cancer in her mother; Colon cancer (age of onset: 71) in her paternal grandfather; Colon cancer (age of onset: 49) in her paternal grandmother; Colon polyps in her father; Lung cancer in her father; Pneumonia in her brother.   ROS:  Please see the history of present illness. Otherwise, complete review of systems is positive for {NONE DEFAULTED:18576::"none"}.  All other systems are reviewed and negative.   Physical Exam: VS:  LMP 08/13/2011 , BMI  There is no height or weight on file to calculate BMI.  Wt Readings from Last 3 Encounters:  06/21/20 173 lb 1 oz (78.5 kg)  05/22/20 186 lb 9.6 oz (84.6 kg)  05/21/20 182 lb 9.6 oz (82.8 kg)    General: Patient appears comfortable at rest. HEENT: Conjunctiva and lids normal, oropharynx clear with moist mucosa. Neck: Supple, no elevated JVP or carotid bruits, no thyromegaly. Lungs: Clear to auscultation, nonlabored breathing at rest. Cardiac: Regular rate and rhythm, no S3 or significant systolic murmur, no pericardial rub. Abdomen: Soft, nontender, no hepatomegaly, bowel sounds present, no guarding or rebound. Extremities: No pitting edema, distal pulses 2+. Skin: Warm and dry. Musculoskeletal: No kyphosis. Neuropsychiatric: Alert and oriented x3, affect grossly appropriate.  ECG:  An ECG dated 03/23/2020 was personally reviewed today and demonstrated:  Sinus tachycardia with left bundle branch block.  Recent Labwork: 02/07/2020: ALT 21; AST 25; Magnesium 2.6 03/23/2020: B Natriuretic Peptide 2,843.0; Platelets 162 06/21/2020: BUN 51; Creatinine, Ser 6.50; Hemoglobin 11.6; Potassium 3.1; Sodium 134     Component Value Date/Time   CHOL 229 (H) 05/19/2017 1912   TRIG 112 01/10/2019 0234   HDL 57 05/19/2017 1912   CHOLHDL 4.0 05/19/2017 1912   VLDL 23 05/19/2017 1912   LDLCALC 149 (H) 05/19/2017 1912    Other Studies Reviewed Today:  Echocardiogram 04/29/2020: 1. Left ventricular ejection fraction, by estimation, is 40%. The left  ventricle has mildly decreased function. The left ventricle demonstrates  global hypokinesis. There is mild left ventricular hypertrophy. Left  ventricular diastolic parameters are  indeterminate.  2. Right ventricular systolic function is normal. The right ventricular  size is normal. There is mildly elevated pulmonary artery systolic  pressure.  3. Left atrial size was severely dilated.  4. Right atrial size was moderately dilated.  5. The  mitral valve is abnormal. Moderate mitral valve regurgitation. No  evidence of mitral stenosis.  6. The aortic valve is tricuspid. Aortic valve regurgitation is not  visualized. No aortic stenosis is present.  7. There is moderate pulmonary HTN, PASP is 41 mmHg.  8. The inferior vena cava is dilated in size with >50% respiratory  variability, suggesting right atrial pressure of  8 mmHg.   Assessment and Plan:    Medication Adjustments/Labs and Tests Ordered: Current medicines are reviewed at length with the patient today.  Concerns regarding medicines are outlined above.   Tests Ordered: No orders of the defined types were placed in this encounter.   Medication Changes: No orders of the defined types were placed in this encounter.   Disposition:  Follow up {follow up:15908}  Signed, Satira Sark, MD, [facility] 09/29/2020 2:22 PM    [place] [facility] [place], [place], [place] [address] Phone: [phone]; Fax: [phone]

## 2020-09-30 ENCOUNTER — Ambulatory Visit: Payer: Medicare Other | Admitting: Cardiology

## 2020-09-30 DIAGNOSIS — I428 Other cardiomyopathies: Secondary | ICD-10-CM

## 2020-10-01 DIAGNOSIS — N186 End stage renal disease: Secondary | ICD-10-CM | POA: Diagnosis not present

## 2020-10-01 DIAGNOSIS — E8779 Other fluid overload: Secondary | ICD-10-CM | POA: Diagnosis not present

## 2020-10-01 DIAGNOSIS — Z992 Dependence on renal dialysis: Secondary | ICD-10-CM | POA: Diagnosis not present

## 2020-10-03 DIAGNOSIS — Z992 Dependence on renal dialysis: Secondary | ICD-10-CM | POA: Diagnosis not present

## 2020-10-03 DIAGNOSIS — N186 End stage renal disease: Secondary | ICD-10-CM | POA: Diagnosis not present

## 2020-10-03 DIAGNOSIS — E8779 Other fluid overload: Secondary | ICD-10-CM | POA: Diagnosis not present

## 2020-10-03 IMAGING — US US RENAL
1 series · 14 of 25 positions shown · non-contrast
Comparison: 12/31/2018, 01/03/2019

CLINICAL DATA: 53-year-old female with anemia and a prior left
kidney biopsy

EXAM:
RENAL / URINARY TRACT ULTRASOUND COMPLETE

[Series 1: us renal · 0.24mm/px · 14 of 40 slices shown]
[im 1/40]
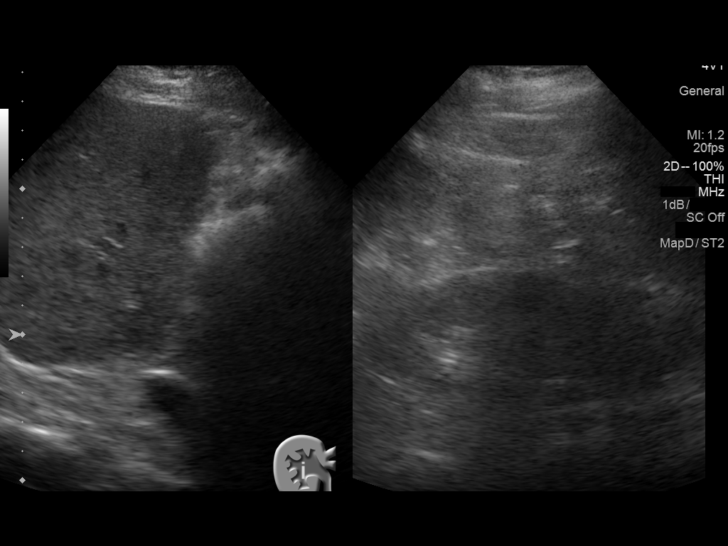
[im 4/40]
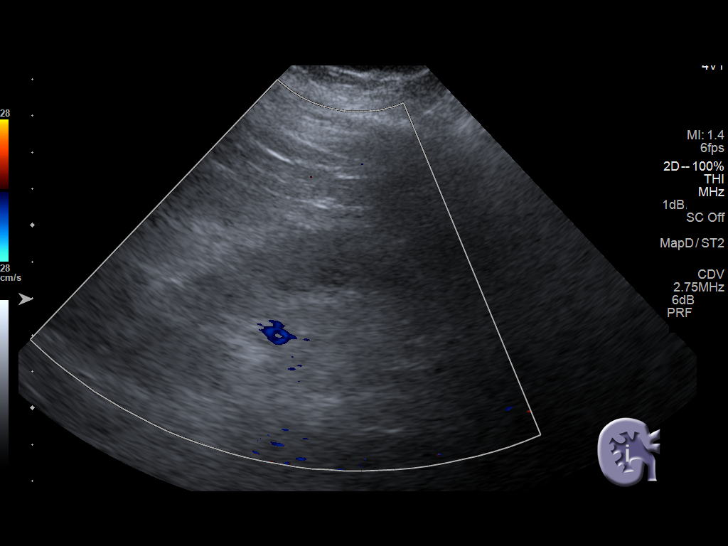
[im 7/40]
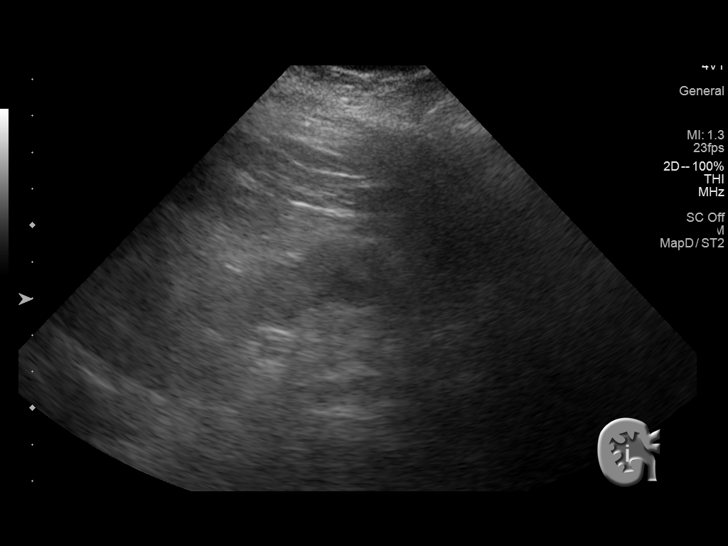
[im 10/40]
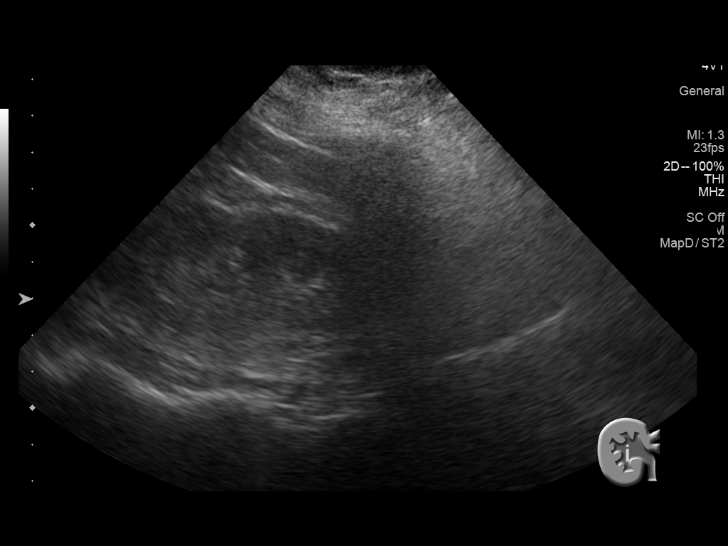
[im 14/40]
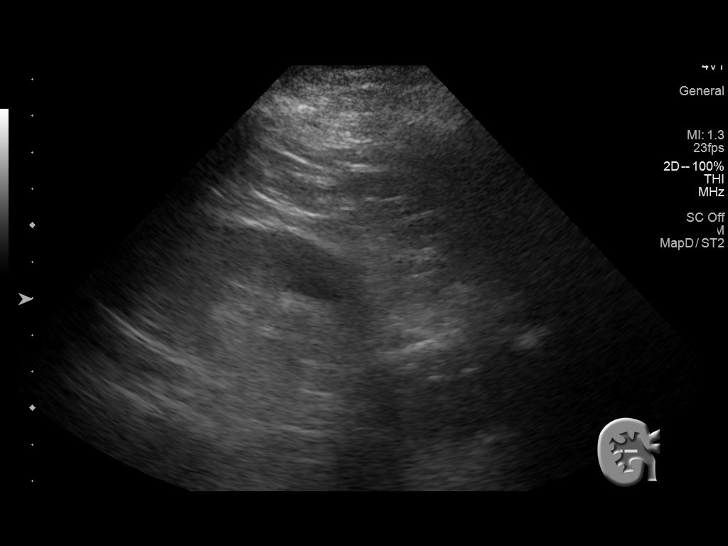
[im 15/40]
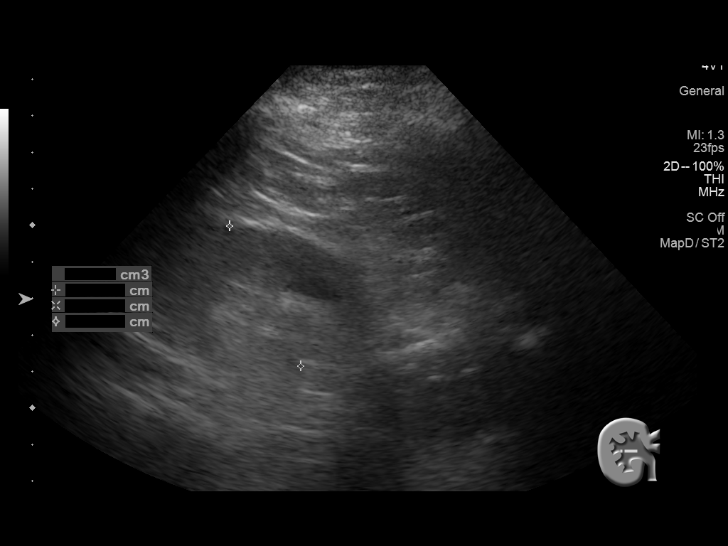
[im 18/40]
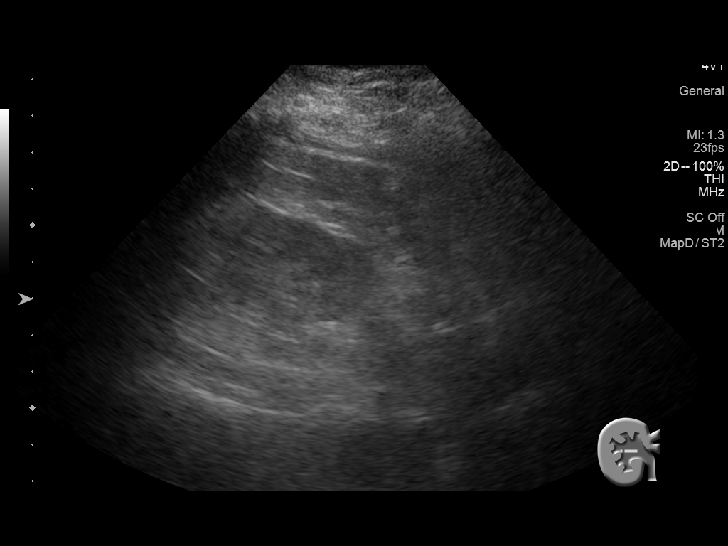
[im 22/40]
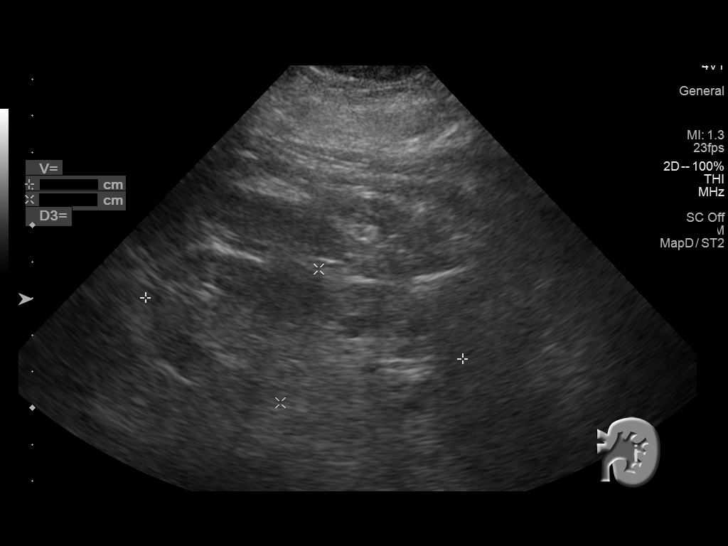
[im 25/40]
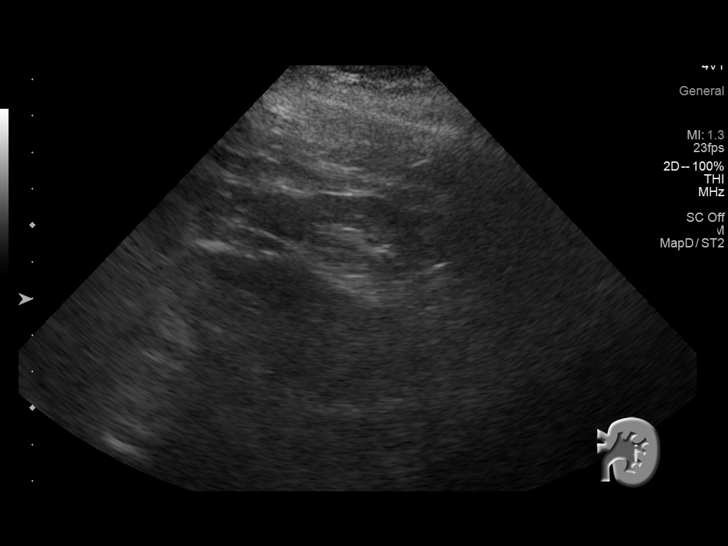
[im 27/40]
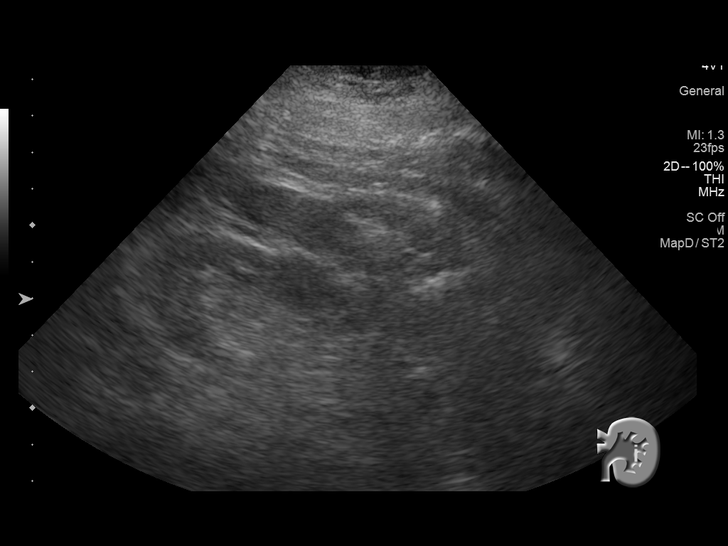
[im 30/40]
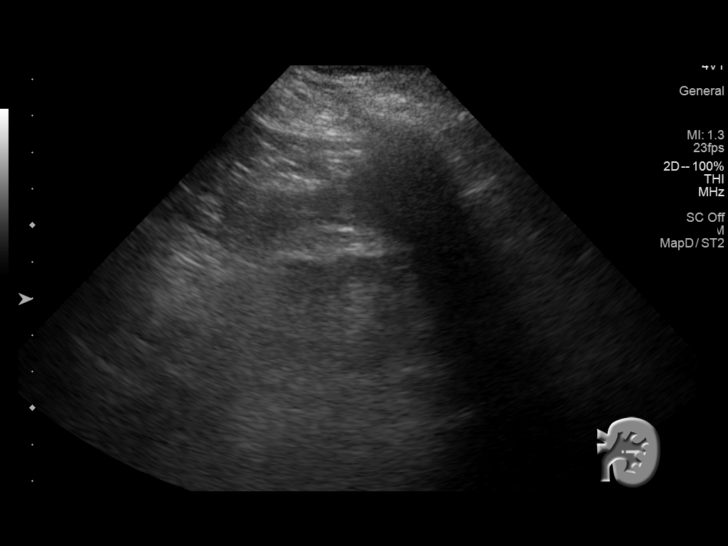
[im 33/40]
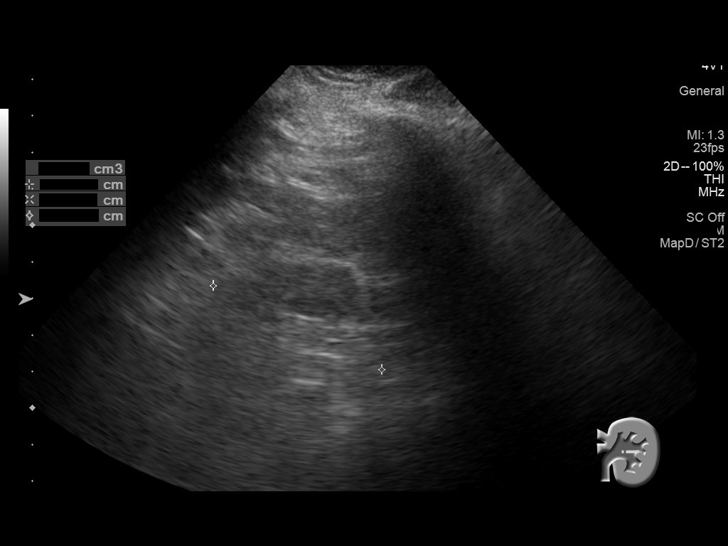
[im 36/40]
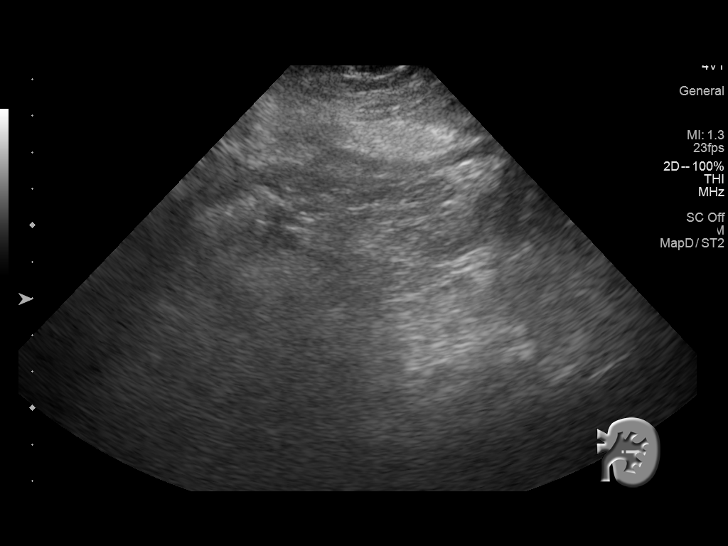
[im 40/40]
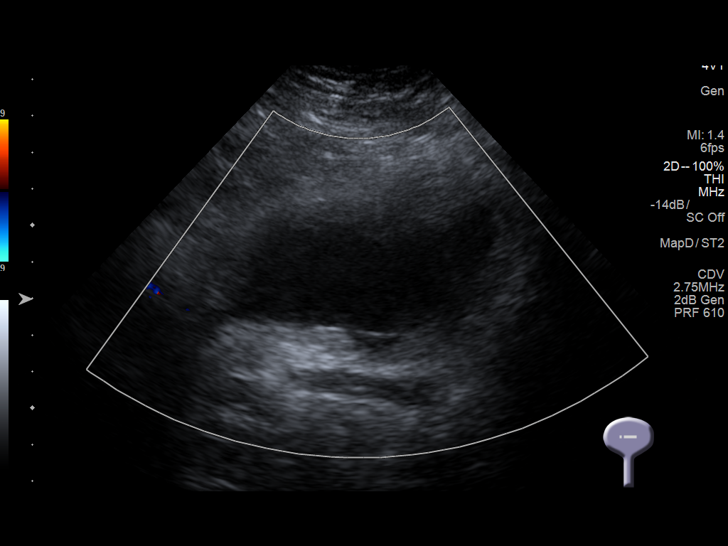

[14 of 25 positions shown; findings below may reference images not displayed]

FINDINGS: Right Kidney:

Length: 9.8 cm x 4.1 cm x 4.3 cm, 91 cc. No hydronephrosis. Flow
confirmed in the hilum of the right kidney.

Left Kidney:

Length: 8.8 cm x 3.8 cm x 5.1 cm, 91 cc. No hydronephrosis. Flow
confirmed in the hilum of the right kidney. There is no fluid
collection on the lateral aspect of the left kidney. Lower pole not
well visualized.

Bladder:

Urinary bladder decompressed
IMPRESSION: No evidence of hydronephrosis of the left or right kidney.

No fluid collection associated with the left kidney.

## 2020-10-05 DIAGNOSIS — N186 End stage renal disease: Secondary | ICD-10-CM | POA: Diagnosis not present

## 2020-10-05 DIAGNOSIS — Z992 Dependence on renal dialysis: Secondary | ICD-10-CM | POA: Diagnosis not present

## 2020-10-05 DIAGNOSIS — E8779 Other fluid overload: Secondary | ICD-10-CM | POA: Diagnosis not present

## 2020-10-08 DIAGNOSIS — D509 Iron deficiency anemia, unspecified: Secondary | ICD-10-CM | POA: Diagnosis not present

## 2020-10-08 DIAGNOSIS — N2581 Secondary hyperparathyroidism of renal origin: Secondary | ICD-10-CM | POA: Diagnosis not present

## 2020-10-08 DIAGNOSIS — Z992 Dependence on renal dialysis: Secondary | ICD-10-CM | POA: Diagnosis not present

## 2020-10-08 DIAGNOSIS — N186 End stage renal disease: Secondary | ICD-10-CM | POA: Diagnosis not present

## 2020-10-08 DIAGNOSIS — E119 Type 2 diabetes mellitus without complications: Secondary | ICD-10-CM | POA: Diagnosis not present

## 2020-10-08 DIAGNOSIS — E8779 Other fluid overload: Secondary | ICD-10-CM | POA: Diagnosis not present

## 2020-10-09 DIAGNOSIS — G894 Chronic pain syndrome: Secondary | ICD-10-CM | POA: Diagnosis not present

## 2020-10-09 DIAGNOSIS — N185 Chronic kidney disease, stage 5: Secondary | ICD-10-CM | POA: Diagnosis not present

## 2020-10-09 DIAGNOSIS — Z992 Dependence on renal dialysis: Secondary | ICD-10-CM | POA: Diagnosis not present

## 2020-10-12 DIAGNOSIS — Z992 Dependence on renal dialysis: Secondary | ICD-10-CM | POA: Diagnosis not present

## 2020-10-12 DIAGNOSIS — E8779 Other fluid overload: Secondary | ICD-10-CM | POA: Diagnosis not present

## 2020-10-12 DIAGNOSIS — N186 End stage renal disease: Secondary | ICD-10-CM | POA: Diagnosis not present

## 2020-10-17 DIAGNOSIS — E8779 Other fluid overload: Secondary | ICD-10-CM | POA: Diagnosis not present

## 2020-10-17 DIAGNOSIS — N186 End stage renal disease: Secondary | ICD-10-CM | POA: Diagnosis not present

## 2020-10-17 DIAGNOSIS — Z992 Dependence on renal dialysis: Secondary | ICD-10-CM | POA: Diagnosis not present

## 2020-10-19 DIAGNOSIS — Z992 Dependence on renal dialysis: Secondary | ICD-10-CM | POA: Diagnosis not present

## 2020-10-19 DIAGNOSIS — E8779 Other fluid overload: Secondary | ICD-10-CM | POA: Diagnosis not present

## 2020-10-19 DIAGNOSIS — N186 End stage renal disease: Secondary | ICD-10-CM | POA: Diagnosis not present

## 2020-10-24 DIAGNOSIS — N186 End stage renal disease: Secondary | ICD-10-CM | POA: Diagnosis not present

## 2020-10-24 DIAGNOSIS — N2581 Secondary hyperparathyroidism of renal origin: Secondary | ICD-10-CM | POA: Diagnosis not present

## 2020-10-24 DIAGNOSIS — E8779 Other fluid overload: Secondary | ICD-10-CM | POA: Diagnosis not present

## 2020-10-24 DIAGNOSIS — Z992 Dependence on renal dialysis: Secondary | ICD-10-CM | POA: Diagnosis not present

## 2020-10-28 DIAGNOSIS — N186 End stage renal disease: Secondary | ICD-10-CM | POA: Diagnosis not present

## 2020-10-28 DIAGNOSIS — Z992 Dependence on renal dialysis: Secondary | ICD-10-CM | POA: Diagnosis not present

## 2020-10-29 DIAGNOSIS — N186 End stage renal disease: Secondary | ICD-10-CM | POA: Diagnosis not present

## 2020-10-29 DIAGNOSIS — Z992 Dependence on renal dialysis: Secondary | ICD-10-CM | POA: Diagnosis not present

## 2020-11-02 DIAGNOSIS — N186 End stage renal disease: Secondary | ICD-10-CM | POA: Diagnosis not present

## 2020-11-02 DIAGNOSIS — Z992 Dependence on renal dialysis: Secondary | ICD-10-CM | POA: Diagnosis not present

## 2020-11-05 DIAGNOSIS — Z992 Dependence on renal dialysis: Secondary | ICD-10-CM | POA: Diagnosis not present

## 2020-11-05 DIAGNOSIS — N2581 Secondary hyperparathyroidism of renal origin: Secondary | ICD-10-CM | POA: Diagnosis not present

## 2020-11-05 DIAGNOSIS — N186 End stage renal disease: Secondary | ICD-10-CM | POA: Diagnosis not present

## 2020-11-07 DIAGNOSIS — N185 Chronic kidney disease, stage 5: Secondary | ICD-10-CM | POA: Diagnosis not present

## 2020-11-07 DIAGNOSIS — I209 Angina pectoris, unspecified: Secondary | ICD-10-CM | POA: Diagnosis not present

## 2020-11-07 DIAGNOSIS — I5032 Chronic diastolic (congestive) heart failure: Secondary | ICD-10-CM | POA: Diagnosis not present

## 2020-11-07 DIAGNOSIS — Z23 Encounter for immunization: Secondary | ICD-10-CM | POA: Diagnosis not present

## 2020-11-07 DIAGNOSIS — Z992 Dependence on renal dialysis: Secondary | ICD-10-CM | POA: Diagnosis not present

## 2020-11-07 DIAGNOSIS — D849 Immunodeficiency, unspecified: Secondary | ICD-10-CM | POA: Diagnosis not present

## 2020-11-07 DIAGNOSIS — E271 Primary adrenocortical insufficiency: Secondary | ICD-10-CM | POA: Diagnosis not present

## 2020-11-08 ENCOUNTER — Ambulatory Visit: Payer: Medicare Other | Admitting: Cardiology

## 2020-11-09 DIAGNOSIS — N186 End stage renal disease: Secondary | ICD-10-CM | POA: Diagnosis not present

## 2020-11-09 DIAGNOSIS — Z992 Dependence on renal dialysis: Secondary | ICD-10-CM | POA: Diagnosis not present

## 2020-11-12 DIAGNOSIS — N186 End stage renal disease: Secondary | ICD-10-CM | POA: Diagnosis not present

## 2020-11-12 DIAGNOSIS — Z992 Dependence on renal dialysis: Secondary | ICD-10-CM | POA: Diagnosis not present

## 2020-11-12 DIAGNOSIS — N2581 Secondary hyperparathyroidism of renal origin: Secondary | ICD-10-CM | POA: Diagnosis not present

## 2020-11-16 DIAGNOSIS — Z992 Dependence on renal dialysis: Secondary | ICD-10-CM | POA: Diagnosis not present

## 2020-11-16 DIAGNOSIS — N186 End stage renal disease: Secondary | ICD-10-CM | POA: Diagnosis not present

## 2020-11-19 DIAGNOSIS — Z992 Dependence on renal dialysis: Secondary | ICD-10-CM | POA: Diagnosis not present

## 2020-11-19 DIAGNOSIS — N186 End stage renal disease: Secondary | ICD-10-CM | POA: Diagnosis not present

## 2020-11-23 DIAGNOSIS — N186 End stage renal disease: Secondary | ICD-10-CM | POA: Diagnosis not present

## 2020-11-23 DIAGNOSIS — Z992 Dependence on renal dialysis: Secondary | ICD-10-CM | POA: Diagnosis not present

## 2020-11-25 DIAGNOSIS — Z992 Dependence on renal dialysis: Secondary | ICD-10-CM | POA: Diagnosis not present

## 2020-11-25 DIAGNOSIS — N186 End stage renal disease: Secondary | ICD-10-CM | POA: Diagnosis not present

## 2020-11-28 DIAGNOSIS — Z23 Encounter for immunization: Secondary | ICD-10-CM | POA: Diagnosis not present

## 2020-11-28 DIAGNOSIS — Z992 Dependence on renal dialysis: Secondary | ICD-10-CM | POA: Diagnosis not present

## 2020-11-28 DIAGNOSIS — N186 End stage renal disease: Secondary | ICD-10-CM | POA: Diagnosis not present

## 2020-11-30 DIAGNOSIS — Z992 Dependence on renal dialysis: Secondary | ICD-10-CM | POA: Diagnosis not present

## 2020-11-30 DIAGNOSIS — Z23 Encounter for immunization: Secondary | ICD-10-CM | POA: Diagnosis not present

## 2020-11-30 DIAGNOSIS — N186 End stage renal disease: Secondary | ICD-10-CM | POA: Diagnosis not present

## 2020-12-05 DIAGNOSIS — Z992 Dependence on renal dialysis: Secondary | ICD-10-CM | POA: Diagnosis not present

## 2020-12-05 DIAGNOSIS — Z23 Encounter for immunization: Secondary | ICD-10-CM | POA: Diagnosis not present

## 2020-12-05 DIAGNOSIS — N186 End stage renal disease: Secondary | ICD-10-CM | POA: Diagnosis not present

## 2020-12-10 DIAGNOSIS — Z23 Encounter for immunization: Secondary | ICD-10-CM | POA: Diagnosis not present

## 2020-12-10 DIAGNOSIS — N2581 Secondary hyperparathyroidism of renal origin: Secondary | ICD-10-CM | POA: Diagnosis not present

## 2020-12-10 DIAGNOSIS — Z992 Dependence on renal dialysis: Secondary | ICD-10-CM | POA: Diagnosis not present

## 2020-12-10 DIAGNOSIS — N186 End stage renal disease: Secondary | ICD-10-CM | POA: Diagnosis not present

## 2020-12-14 DIAGNOSIS — Z992 Dependence on renal dialysis: Secondary | ICD-10-CM | POA: Diagnosis not present

## 2020-12-14 DIAGNOSIS — N186 End stage renal disease: Secondary | ICD-10-CM | POA: Diagnosis not present

## 2020-12-14 DIAGNOSIS — Z23 Encounter for immunization: Secondary | ICD-10-CM | POA: Diagnosis not present

## 2020-12-17 DIAGNOSIS — N186 End stage renal disease: Secondary | ICD-10-CM | POA: Diagnosis not present

## 2020-12-17 DIAGNOSIS — N185 Chronic kidney disease, stage 5: Secondary | ICD-10-CM | POA: Diagnosis not present

## 2020-12-17 DIAGNOSIS — G894 Chronic pain syndrome: Secondary | ICD-10-CM | POA: Diagnosis not present

## 2020-12-17 DIAGNOSIS — Z992 Dependence on renal dialysis: Secondary | ICD-10-CM | POA: Diagnosis not present

## 2020-12-17 DIAGNOSIS — Z23 Encounter for immunization: Secondary | ICD-10-CM | POA: Diagnosis not present

## 2020-12-18 ENCOUNTER — Encounter: Payer: Self-pay | Admitting: Adult Health

## 2020-12-21 DIAGNOSIS — Z992 Dependence on renal dialysis: Secondary | ICD-10-CM | POA: Diagnosis not present

## 2020-12-21 DIAGNOSIS — Z23 Encounter for immunization: Secondary | ICD-10-CM | POA: Diagnosis not present

## 2020-12-21 DIAGNOSIS — N186 End stage renal disease: Secondary | ICD-10-CM | POA: Diagnosis not present

## 2020-12-26 DIAGNOSIS — Z23 Encounter for immunization: Secondary | ICD-10-CM | POA: Diagnosis not present

## 2020-12-26 DIAGNOSIS — N186 End stage renal disease: Secondary | ICD-10-CM | POA: Diagnosis not present

## 2020-12-26 DIAGNOSIS — Z992 Dependence on renal dialysis: Secondary | ICD-10-CM | POA: Diagnosis not present

## 2020-12-28 DIAGNOSIS — Z992 Dependence on renal dialysis: Secondary | ICD-10-CM | POA: Diagnosis not present

## 2020-12-28 DIAGNOSIS — N186 End stage renal disease: Secondary | ICD-10-CM | POA: Diagnosis not present

## 2020-12-28 DIAGNOSIS — Z23 Encounter for immunization: Secondary | ICD-10-CM | POA: Diagnosis not present

## 2020-12-31 DIAGNOSIS — Z23 Encounter for immunization: Secondary | ICD-10-CM | POA: Diagnosis not present

## 2020-12-31 DIAGNOSIS — Z992 Dependence on renal dialysis: Secondary | ICD-10-CM | POA: Diagnosis not present

## 2020-12-31 DIAGNOSIS — N186 End stage renal disease: Secondary | ICD-10-CM | POA: Diagnosis not present

## 2021-01-04 DIAGNOSIS — N186 End stage renal disease: Secondary | ICD-10-CM | POA: Diagnosis not present

## 2021-01-04 DIAGNOSIS — Z23 Encounter for immunization: Secondary | ICD-10-CM | POA: Diagnosis not present

## 2021-01-04 DIAGNOSIS — Z992 Dependence on renal dialysis: Secondary | ICD-10-CM | POA: Diagnosis not present

## 2021-01-07 DIAGNOSIS — Z992 Dependence on renal dialysis: Secondary | ICD-10-CM | POA: Diagnosis not present

## 2021-01-07 DIAGNOSIS — N186 End stage renal disease: Secondary | ICD-10-CM | POA: Diagnosis not present

## 2021-01-07 DIAGNOSIS — Z23 Encounter for immunization: Secondary | ICD-10-CM | POA: Diagnosis not present

## 2021-01-08 DIAGNOSIS — R1310 Dysphagia, unspecified: Secondary | ICD-10-CM | POA: Diagnosis not present

## 2021-01-08 DIAGNOSIS — T50905A Adverse effect of unspecified drugs, medicaments and biological substances, initial encounter: Secondary | ICD-10-CM | POA: Diagnosis not present

## 2021-01-08 DIAGNOSIS — E1129 Type 2 diabetes mellitus with other diabetic kidney complication: Secondary | ICD-10-CM | POA: Diagnosis not present

## 2021-01-08 DIAGNOSIS — N185 Chronic kidney disease, stage 5: Secondary | ICD-10-CM | POA: Diagnosis not present

## 2021-01-08 DIAGNOSIS — N186 End stage renal disease: Secondary | ICD-10-CM | POA: Diagnosis not present

## 2021-01-11 DIAGNOSIS — N186 End stage renal disease: Secondary | ICD-10-CM | POA: Diagnosis not present

## 2021-01-11 DIAGNOSIS — Z23 Encounter for immunization: Secondary | ICD-10-CM | POA: Diagnosis not present

## 2021-01-11 DIAGNOSIS — Z992 Dependence on renal dialysis: Secondary | ICD-10-CM | POA: Diagnosis not present

## 2021-01-13 DIAGNOSIS — N2581 Secondary hyperparathyroidism of renal origin: Secondary | ICD-10-CM | POA: Diagnosis not present

## 2021-01-13 DIAGNOSIS — N186 End stage renal disease: Secondary | ICD-10-CM | POA: Diagnosis not present

## 2021-01-13 DIAGNOSIS — Z23 Encounter for immunization: Secondary | ICD-10-CM | POA: Diagnosis not present

## 2021-01-13 DIAGNOSIS — Z992 Dependence on renal dialysis: Secondary | ICD-10-CM | POA: Diagnosis not present

## 2021-01-15 ENCOUNTER — Encounter (INDEPENDENT_AMBULATORY_CARE_PROVIDER_SITE_OTHER): Payer: Self-pay | Admitting: *Deleted

## 2021-01-17 DIAGNOSIS — Z992 Dependence on renal dialysis: Secondary | ICD-10-CM | POA: Diagnosis not present

## 2021-01-17 DIAGNOSIS — N186 End stage renal disease: Secondary | ICD-10-CM | POA: Diagnosis not present

## 2021-01-17 DIAGNOSIS — Z23 Encounter for immunization: Secondary | ICD-10-CM | POA: Diagnosis not present

## 2021-01-22 DIAGNOSIS — Z992 Dependence on renal dialysis: Secondary | ICD-10-CM | POA: Diagnosis not present

## 2021-01-22 DIAGNOSIS — Z23 Encounter for immunization: Secondary | ICD-10-CM | POA: Diagnosis not present

## 2021-01-22 DIAGNOSIS — N186 End stage renal disease: Secondary | ICD-10-CM | POA: Diagnosis not present

## 2021-01-25 DIAGNOSIS — N186 End stage renal disease: Secondary | ICD-10-CM | POA: Diagnosis not present

## 2021-01-25 DIAGNOSIS — Z992 Dependence on renal dialysis: Secondary | ICD-10-CM | POA: Diagnosis not present

## 2021-01-27 DIAGNOSIS — Z992 Dependence on renal dialysis: Secondary | ICD-10-CM | POA: Diagnosis not present

## 2021-01-27 DIAGNOSIS — N186 End stage renal disease: Secondary | ICD-10-CM | POA: Diagnosis not present

## 2021-01-31 DIAGNOSIS — Z992 Dependence on renal dialysis: Secondary | ICD-10-CM | POA: Diagnosis not present

## 2021-01-31 DIAGNOSIS — N186 End stage renal disease: Secondary | ICD-10-CM | POA: Diagnosis not present

## 2021-02-05 DIAGNOSIS — N186 End stage renal disease: Secondary | ICD-10-CM | POA: Diagnosis not present

## 2021-02-05 DIAGNOSIS — Z992 Dependence on renal dialysis: Secondary | ICD-10-CM | POA: Diagnosis not present

## 2021-02-10 DIAGNOSIS — Z992 Dependence on renal dialysis: Secondary | ICD-10-CM | POA: Diagnosis not present

## 2021-02-10 DIAGNOSIS — N186 End stage renal disease: Secondary | ICD-10-CM | POA: Diagnosis not present

## 2021-02-14 DIAGNOSIS — N186 End stage renal disease: Secondary | ICD-10-CM | POA: Diagnosis not present

## 2021-02-14 DIAGNOSIS — E271 Primary adrenocortical insufficiency: Secondary | ICD-10-CM | POA: Diagnosis not present

## 2021-02-14 DIAGNOSIS — Z992 Dependence on renal dialysis: Secondary | ICD-10-CM | POA: Diagnosis not present

## 2021-02-14 DIAGNOSIS — E1129 Type 2 diabetes mellitus with other diabetic kidney complication: Secondary | ICD-10-CM | POA: Diagnosis not present

## 2021-02-14 DIAGNOSIS — G894 Chronic pain syndrome: Secondary | ICD-10-CM | POA: Diagnosis not present

## 2021-02-14 DIAGNOSIS — I272 Pulmonary hypertension, unspecified: Secondary | ICD-10-CM | POA: Diagnosis not present

## 2021-02-14 DIAGNOSIS — N185 Chronic kidney disease, stage 5: Secondary | ICD-10-CM | POA: Diagnosis not present

## 2021-02-14 DIAGNOSIS — I5032 Chronic diastolic (congestive) heart failure: Secondary | ICD-10-CM | POA: Diagnosis not present

## 2021-02-17 DIAGNOSIS — Z992 Dependence on renal dialysis: Secondary | ICD-10-CM | POA: Diagnosis not present

## 2021-02-17 DIAGNOSIS — N186 End stage renal disease: Secondary | ICD-10-CM | POA: Diagnosis not present

## 2021-02-20 IMAGING — US US EXTREM  UP VENOUS*L*
2 series · 13 of 24 positions shown · non-contrast
Comparison: None.

CLINICAL DATA: Left upper extremity pain and edema. Patient with
recently created left upper extremity AV fistula and currently a
right chest hemodialysis catheter. Evaluate for DVT.



[Series 1: us extrem up venous*left* · 12 of 53 slices shown (1 of 2)]
[im 1/53]
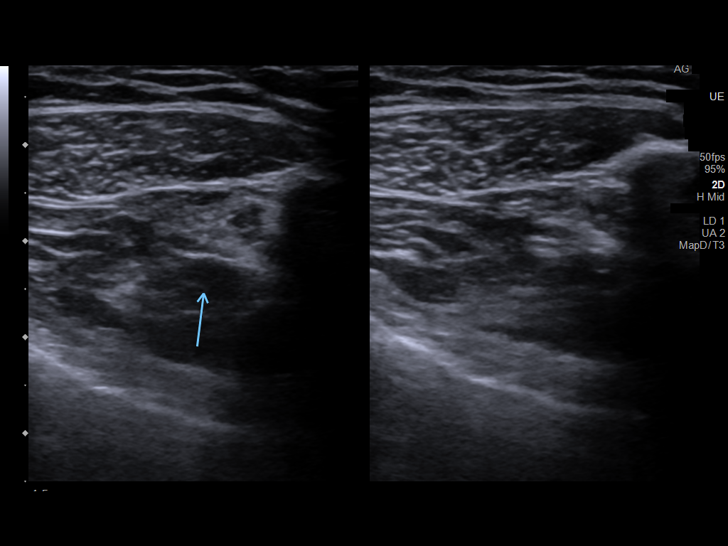
[im 5/53]
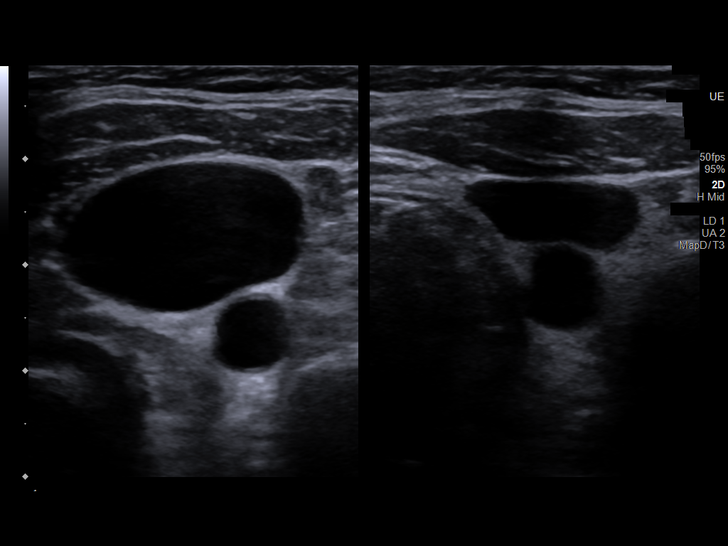
[im 10/53]
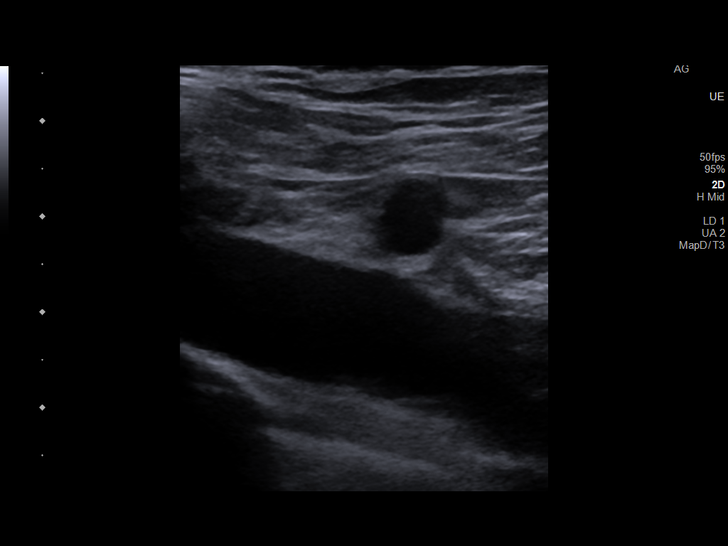
[im 15/53]
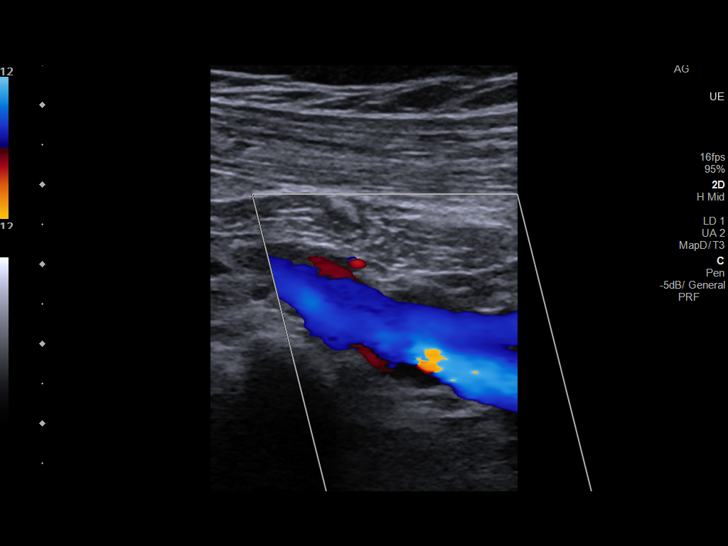
[im 19/53]
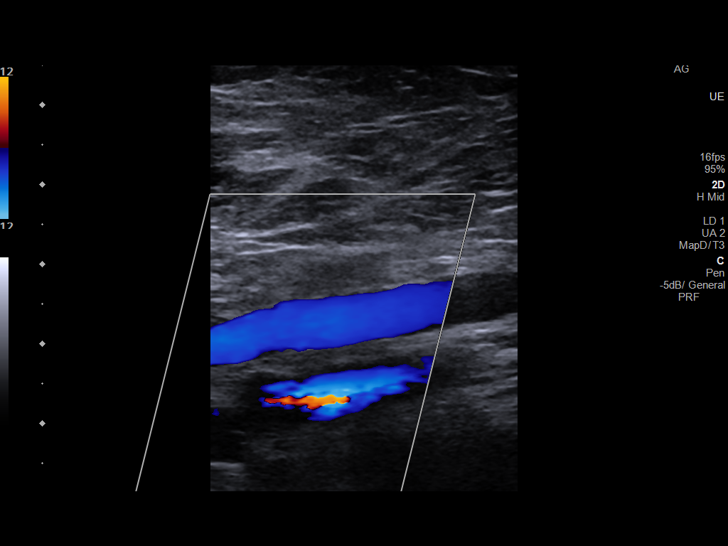
[im 24/53]
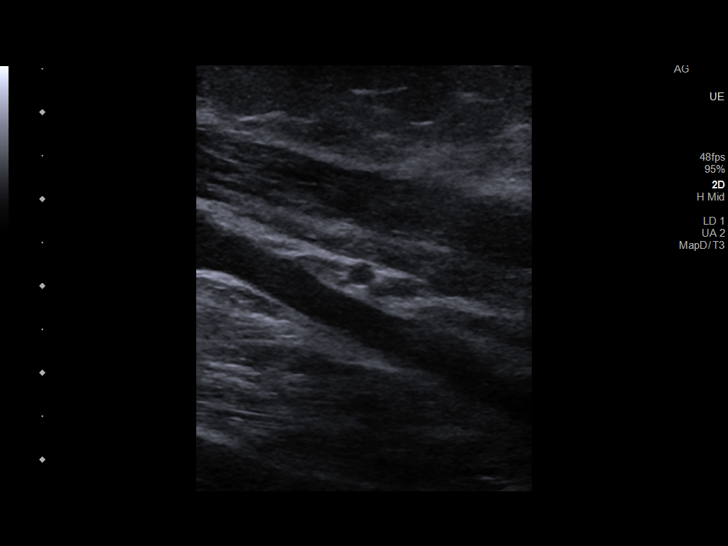
[im 29/53]
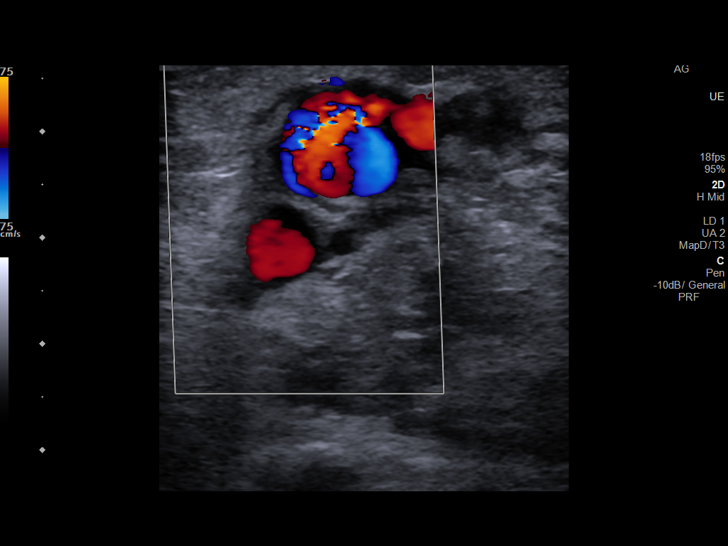
[im 31/53]
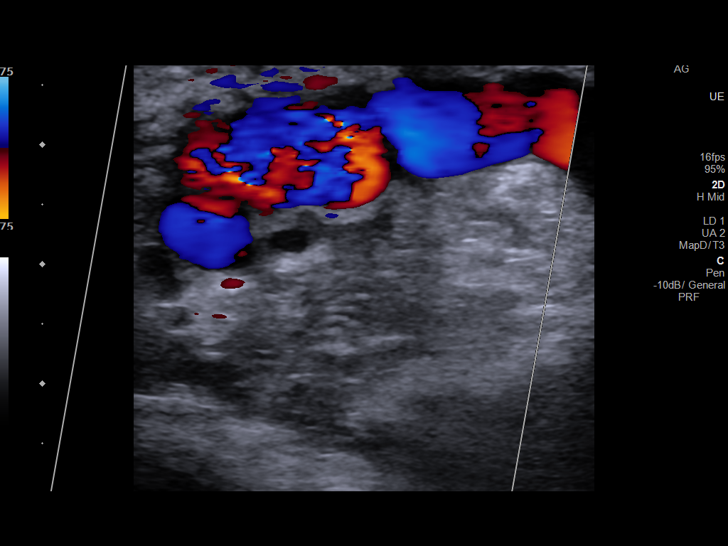
[im 36/53]
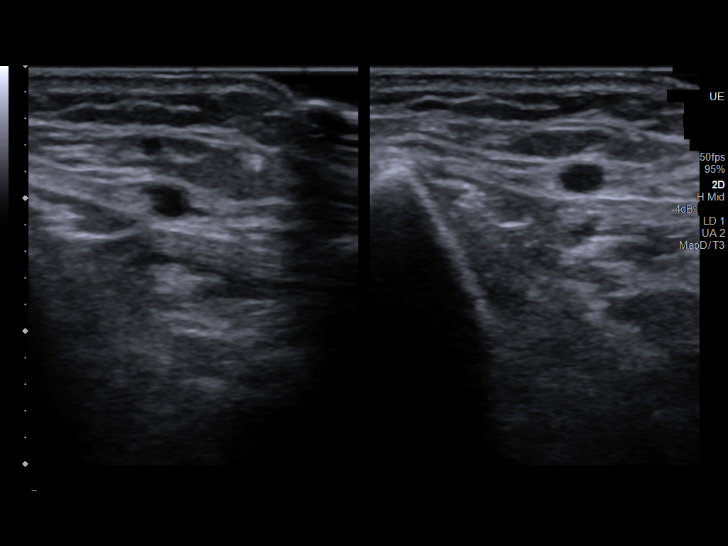
[im 41/53]
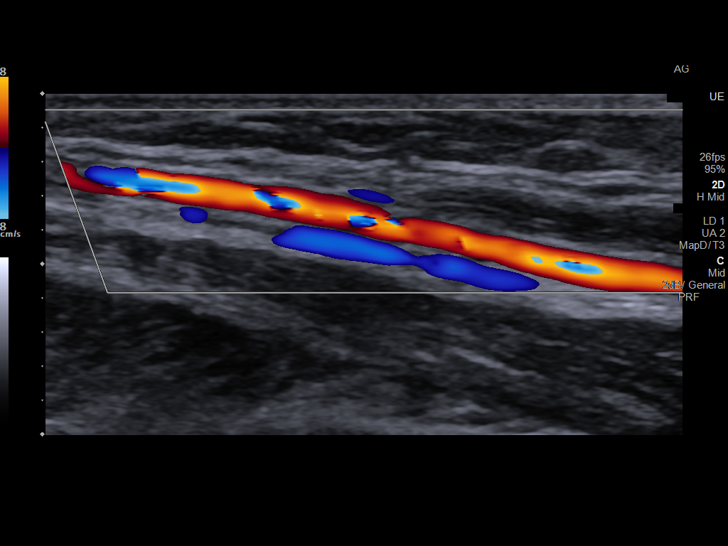
[im 45/53]
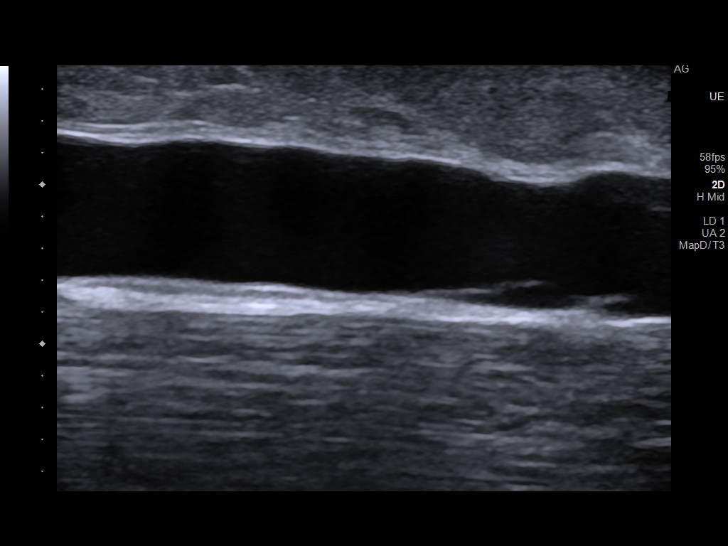
[im 50/53]
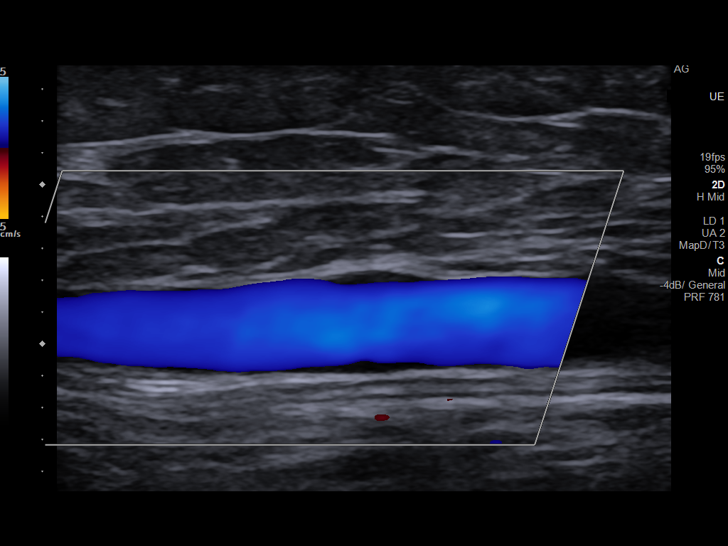

[Series 2: us extrem up venous*left* · 1 of 1 slices shown (2 of 2)]
[im 1/1]
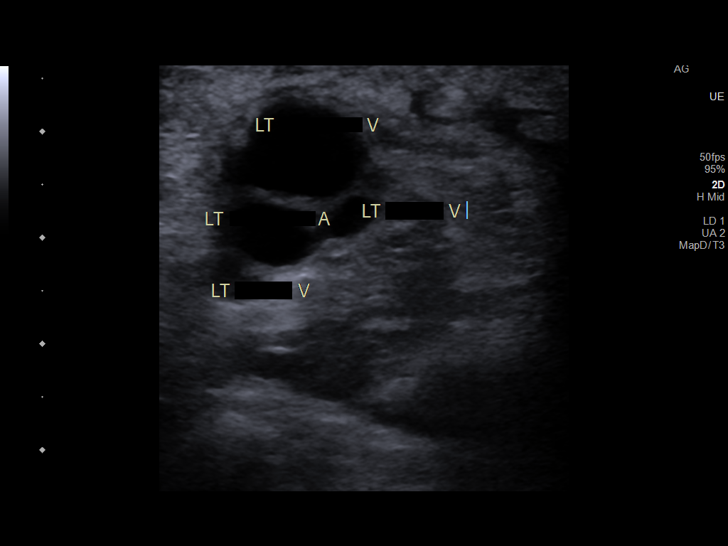

[13 of 24 positions shown; findings below may reference images not displayed]

FINDINGS: Contralateral Subclavian Vein: Respiratory phasicity is normal and
symmetric with the symptomatic side. No evidence of thrombus. Normal
compressibility.

Internal Jugular Vein: No evidence of thrombus. Normal
compressibility, respiratory phasicity and response to augmentation.

Subclavian Vein: No evidence of thrombus. Normal compressibility,
respiratory phasicity and response to augmentation.

Axillary Vein: No evidence of thrombus. Normal compressibility,
respiratory phasicity and response to augmentation.

Cephalic Vein: No evidence of thrombus. Normal compressibility,
respiratory phasicity and response to augmentation.

Basilic Vein: No evidence of thrombus. Normal compressibility,
respiratory phasicity and response to augmentation.

Brachial Veins: No evidence of thrombus. Normal compressibility,
respiratory phasicity and response to augmentation.

Radial Veins: No evidence of thrombus. Normal compressibility,
respiratory phasicity and response to augmentation.

Ulnar Veins: No evidence of thrombus. Normal compressibility,
respiratory phasicity and response to augmentation.

Venous Reflux:  None visualized.

Other Findings: Imaged portions of the brachial cephalic AV fistula
appear widely patent with minimal amount of subcutaneous edema about
the anastomotic site (images 53 and 54).
IMPRESSION: 1. No evidence of DVT within the left upper extremity.
2. Imaged portions of the left upper arm AV fistula appear widely
patent.

## 2021-02-21 DIAGNOSIS — Z992 Dependence on renal dialysis: Secondary | ICD-10-CM | POA: Diagnosis not present

## 2021-02-21 DIAGNOSIS — N186 End stage renal disease: Secondary | ICD-10-CM | POA: Diagnosis not present

## 2021-02-25 DIAGNOSIS — N186 End stage renal disease: Secondary | ICD-10-CM | POA: Diagnosis not present

## 2021-02-25 DIAGNOSIS — Z992 Dependence on renal dialysis: Secondary | ICD-10-CM | POA: Diagnosis not present

## 2021-02-26 DIAGNOSIS — Z992 Dependence on renal dialysis: Secondary | ICD-10-CM | POA: Diagnosis not present

## 2021-02-26 DIAGNOSIS — N186 End stage renal disease: Secondary | ICD-10-CM | POA: Diagnosis not present

## 2021-02-27 DIAGNOSIS — N186 End stage renal disease: Secondary | ICD-10-CM | POA: Diagnosis not present

## 2021-02-27 DIAGNOSIS — Z992 Dependence on renal dialysis: Secondary | ICD-10-CM | POA: Diagnosis not present

## 2021-02-28 DIAGNOSIS — Z992 Dependence on renal dialysis: Secondary | ICD-10-CM | POA: Diagnosis not present

## 2021-02-28 DIAGNOSIS — N186 End stage renal disease: Secondary | ICD-10-CM | POA: Diagnosis not present

## 2021-03-01 DIAGNOSIS — Z992 Dependence on renal dialysis: Secondary | ICD-10-CM | POA: Diagnosis not present

## 2021-03-01 DIAGNOSIS — N186 End stage renal disease: Secondary | ICD-10-CM | POA: Diagnosis not present

## 2021-03-02 DIAGNOSIS — N186 End stage renal disease: Secondary | ICD-10-CM | POA: Diagnosis not present

## 2021-03-02 DIAGNOSIS — Z992 Dependence on renal dialysis: Secondary | ICD-10-CM | POA: Diagnosis not present

## 2021-03-03 DIAGNOSIS — Z992 Dependence on renal dialysis: Secondary | ICD-10-CM | POA: Diagnosis not present

## 2021-03-03 DIAGNOSIS — N186 End stage renal disease: Secondary | ICD-10-CM | POA: Diagnosis not present

## 2021-03-04 DIAGNOSIS — N186 End stage renal disease: Secondary | ICD-10-CM | POA: Diagnosis not present

## 2021-03-04 DIAGNOSIS — Z992 Dependence on renal dialysis: Secondary | ICD-10-CM | POA: Diagnosis not present

## 2021-03-05 DIAGNOSIS — N186 End stage renal disease: Secondary | ICD-10-CM | POA: Diagnosis not present

## 2021-03-05 DIAGNOSIS — Z992 Dependence on renal dialysis: Secondary | ICD-10-CM | POA: Diagnosis not present

## 2021-03-06 DIAGNOSIS — Z992 Dependence on renal dialysis: Secondary | ICD-10-CM | POA: Diagnosis not present

## 2021-03-06 DIAGNOSIS — N186 End stage renal disease: Secondary | ICD-10-CM | POA: Diagnosis not present

## 2021-03-07 ENCOUNTER — Inpatient Hospital Stay (HOSPITAL_COMMUNITY)
Admission: EM | Admit: 2021-03-07 | Discharge: 2021-03-28 | DRG: 291 | Disposition: E | Payer: Medicare Other | Attending: Internal Medicine | Admitting: Internal Medicine

## 2021-03-07 ENCOUNTER — Inpatient Hospital Stay (HOSPITAL_COMMUNITY): Payer: Medicare Other

## 2021-03-07 ENCOUNTER — Emergency Department (HOSPITAL_COMMUNITY): Payer: Medicare Other

## 2021-03-07 ENCOUNTER — Encounter (HOSPITAL_COMMUNITY): Payer: Self-pay

## 2021-03-07 DIAGNOSIS — E8809 Other disorders of plasma-protein metabolism, not elsewhere classified: Secondary | ICD-10-CM | POA: Diagnosis not present

## 2021-03-07 DIAGNOSIS — J189 Pneumonia, unspecified organism: Secondary | ICD-10-CM

## 2021-03-07 DIAGNOSIS — I2510 Atherosclerotic heart disease of native coronary artery without angina pectoris: Secondary | ICD-10-CM | POA: Diagnosis present

## 2021-03-07 DIAGNOSIS — N186 End stage renal disease: Secondary | ICD-10-CM | POA: Diagnosis present

## 2021-03-07 DIAGNOSIS — J9811 Atelectasis: Secondary | ICD-10-CM | POA: Diagnosis not present

## 2021-03-07 DIAGNOSIS — R579 Shock, unspecified: Secondary | ICD-10-CM

## 2021-03-07 DIAGNOSIS — Z4682 Encounter for fitting and adjustment of non-vascular catheter: Secondary | ICD-10-CM | POA: Diagnosis not present

## 2021-03-07 DIAGNOSIS — Z79899 Other long term (current) drug therapy: Secondary | ICD-10-CM

## 2021-03-07 DIAGNOSIS — I447 Left bundle-branch block, unspecified: Secondary | ICD-10-CM | POA: Diagnosis not present

## 2021-03-07 DIAGNOSIS — Z515 Encounter for palliative care: Secondary | ICD-10-CM

## 2021-03-07 DIAGNOSIS — J984 Other disorders of lung: Secondary | ICD-10-CM | POA: Diagnosis not present

## 2021-03-07 DIAGNOSIS — I5043 Acute on chronic combined systolic (congestive) and diastolic (congestive) heart failure: Secondary | ICD-10-CM | POA: Diagnosis not present

## 2021-03-07 DIAGNOSIS — E871 Hypo-osmolality and hyponatremia: Secondary | ICD-10-CM | POA: Diagnosis not present

## 2021-03-07 DIAGNOSIS — D631 Anemia in chronic kidney disease: Secondary | ICD-10-CM | POA: Diagnosis not present

## 2021-03-07 DIAGNOSIS — Z992 Dependence on renal dialysis: Secondary | ICD-10-CM

## 2021-03-07 DIAGNOSIS — K7460 Unspecified cirrhosis of liver: Secondary | ICD-10-CM | POA: Diagnosis present

## 2021-03-07 DIAGNOSIS — J45909 Unspecified asthma, uncomplicated: Secondary | ICD-10-CM | POA: Diagnosis present

## 2021-03-07 DIAGNOSIS — M1990 Unspecified osteoarthritis, unspecified site: Secondary | ICD-10-CM | POA: Diagnosis present

## 2021-03-07 DIAGNOSIS — I499 Cardiac arrhythmia, unspecified: Secondary | ICD-10-CM | POA: Diagnosis not present

## 2021-03-07 DIAGNOSIS — J9601 Acute respiratory failure with hypoxia: Secondary | ICD-10-CM | POA: Diagnosis not present

## 2021-03-07 DIAGNOSIS — I469 Cardiac arrest, cause unspecified: Secondary | ICD-10-CM

## 2021-03-07 DIAGNOSIS — Z20822 Contact with and (suspected) exposure to covid-19: Secondary | ICD-10-CM | POA: Diagnosis present

## 2021-03-07 DIAGNOSIS — Z86711 Personal history of pulmonary embolism: Secondary | ICD-10-CM

## 2021-03-07 DIAGNOSIS — I468 Cardiac arrest due to other underlying condition: Secondary | ICD-10-CM | POA: Diagnosis not present

## 2021-03-07 DIAGNOSIS — N2581 Secondary hyperparathyroidism of renal origin: Secondary | ICD-10-CM | POA: Diagnosis present

## 2021-03-07 DIAGNOSIS — X58XXXA Exposure to other specified factors, initial encounter: Secondary | ICD-10-CM | POA: Diagnosis present

## 2021-03-07 DIAGNOSIS — G319 Degenerative disease of nervous system, unspecified: Secondary | ICD-10-CM | POA: Diagnosis not present

## 2021-03-07 DIAGNOSIS — Z881 Allergy status to other antibiotic agents status: Secondary | ICD-10-CM

## 2021-03-07 DIAGNOSIS — R000 Tachycardia, unspecified: Secondary | ICD-10-CM | POA: Diagnosis not present

## 2021-03-07 DIAGNOSIS — I252 Old myocardial infarction: Secondary | ICD-10-CM

## 2021-03-07 DIAGNOSIS — S2243XA Multiple fractures of ribs, bilateral, initial encounter for closed fracture: Secondary | ICD-10-CM | POA: Diagnosis present

## 2021-03-07 DIAGNOSIS — D693 Immune thrombocytopenic purpura: Secondary | ICD-10-CM | POA: Diagnosis not present

## 2021-03-07 DIAGNOSIS — J90 Pleural effusion, not elsewhere classified: Secondary | ICD-10-CM | POA: Diagnosis not present

## 2021-03-07 DIAGNOSIS — G40901 Epilepsy, unspecified, not intractable, with status epilepticus: Secondary | ICD-10-CM | POA: Diagnosis present

## 2021-03-07 DIAGNOSIS — R680 Hypothermia, not associated with low environmental temperature: Secondary | ICD-10-CM | POA: Diagnosis not present

## 2021-03-07 DIAGNOSIS — R4020 Unspecified coma: Secondary | ICD-10-CM | POA: Diagnosis not present

## 2021-03-07 DIAGNOSIS — R092 Respiratory arrest: Secondary | ICD-10-CM | POA: Diagnosis not present

## 2021-03-07 DIAGNOSIS — I132 Hypertensive heart and chronic kidney disease with heart failure and with stage 5 chronic kidney disease, or end stage renal disease: Secondary | ICD-10-CM | POA: Diagnosis not present

## 2021-03-07 DIAGNOSIS — Z7189 Other specified counseling: Secondary | ICD-10-CM | POA: Diagnosis not present

## 2021-03-07 DIAGNOSIS — N250 Renal osteodystrophy: Secondary | ICD-10-CM | POA: Diagnosis not present

## 2021-03-07 DIAGNOSIS — J9600 Acute respiratory failure, unspecified whether with hypoxia or hypercapnia: Secondary | ICD-10-CM | POA: Diagnosis not present

## 2021-03-07 DIAGNOSIS — G931 Anoxic brain damage, not elsewhere classified: Secondary | ICD-10-CM | POA: Diagnosis not present

## 2021-03-07 DIAGNOSIS — K219 Gastro-esophageal reflux disease without esophagitis: Secondary | ICD-10-CM | POA: Diagnosis present

## 2021-03-07 DIAGNOSIS — Z8673 Personal history of transient ischemic attack (TIA), and cerebral infarction without residual deficits: Secondary | ICD-10-CM

## 2021-03-07 DIAGNOSIS — R569 Unspecified convulsions: Secondary | ICD-10-CM | POA: Diagnosis not present

## 2021-03-07 DIAGNOSIS — Z888 Allergy status to other drugs, medicaments and biological substances status: Secondary | ICD-10-CM

## 2021-03-07 DIAGNOSIS — J811 Chronic pulmonary edema: Secondary | ICD-10-CM | POA: Diagnosis not present

## 2021-03-07 DIAGNOSIS — I2699 Other pulmonary embolism without acute cor pulmonale: Secondary | ICD-10-CM | POA: Diagnosis not present

## 2021-03-07 DIAGNOSIS — Z01818 Encounter for other preprocedural examination: Secondary | ICD-10-CM

## 2021-03-07 DIAGNOSIS — R9401 Abnormal electroencephalogram [EEG]: Secondary | ICD-10-CM | POA: Diagnosis not present

## 2021-03-07 DIAGNOSIS — Z885 Allergy status to narcotic agent status: Secondary | ICD-10-CM

## 2021-03-07 DIAGNOSIS — J323 Chronic sphenoidal sinusitis: Secondary | ICD-10-CM | POA: Diagnosis not present

## 2021-03-07 DIAGNOSIS — E162 Hypoglycemia, unspecified: Secondary | ICD-10-CM | POA: Diagnosis not present

## 2021-03-07 DIAGNOSIS — I509 Heart failure, unspecified: Secondary | ICD-10-CM | POA: Diagnosis not present

## 2021-03-07 DIAGNOSIS — E860 Dehydration: Secondary | ICD-10-CM | POA: Diagnosis present

## 2021-03-07 DIAGNOSIS — Z743 Need for continuous supervision: Secondary | ICD-10-CM | POA: Diagnosis not present

## 2021-03-07 DIAGNOSIS — M898X9 Other specified disorders of bone, unspecified site: Secondary | ICD-10-CM | POA: Diagnosis present

## 2021-03-07 DIAGNOSIS — E876 Hypokalemia: Secondary | ICD-10-CM | POA: Diagnosis not present

## 2021-03-07 DIAGNOSIS — R918 Other nonspecific abnormal finding of lung field: Secondary | ICD-10-CM | POA: Diagnosis not present

## 2021-03-07 DIAGNOSIS — R404 Transient alteration of awareness: Secondary | ICD-10-CM | POA: Diagnosis not present

## 2021-03-07 DIAGNOSIS — R001 Bradycardia, unspecified: Secondary | ICD-10-CM | POA: Diagnosis not present

## 2021-03-07 LAB — CBC WITH DIFFERENTIAL/PLATELET
Abs Immature Granulocytes: 0.24 10*3/uL — ABNORMAL HIGH (ref 0.00–0.07)
Basophils Absolute: 0.1 10*3/uL (ref 0.0–0.1)
Basophils Relative: 0 %
Eosinophils Absolute: 0.2 10*3/uL (ref 0.0–0.5)
Eosinophils Relative: 1 %
HCT: 35.9 % — ABNORMAL LOW (ref 36.0–46.0)
Hemoglobin: 11.2 g/dL — ABNORMAL LOW (ref 12.0–15.0)
Immature Granulocytes: 2 %
Lymphocytes Relative: 21 %
Lymphs Abs: 2.8 10*3/uL (ref 0.7–4.0)
MCH: 32.4 pg (ref 26.0–34.0)
MCHC: 31.2 g/dL (ref 30.0–36.0)
MCV: 103.8 fL — ABNORMAL HIGH (ref 80.0–100.0)
Monocytes Absolute: 0.3 10*3/uL (ref 0.1–1.0)
Monocytes Relative: 2 %
Neutro Abs: 9.9 10*3/uL — ABNORMAL HIGH (ref 1.7–7.7)
Neutrophils Relative %: 74 %
Platelets: 110 10*3/uL — ABNORMAL LOW (ref 150–400)
RBC: 3.46 MIL/uL — ABNORMAL LOW (ref 3.87–5.11)
RDW: 14.6 % (ref 11.5–15.5)
WBC: 13.4 10*3/uL — ABNORMAL HIGH (ref 4.0–10.5)
nRBC: 0.2 % (ref 0.0–0.2)

## 2021-03-07 LAB — BLOOD GAS, ARTERIAL
Acid-base deficit: 6.3 mmol/L — ABNORMAL HIGH (ref 0.0–2.0)
Acid-base deficit: 4.7 mmol/L — ABNORMAL HIGH (ref 0.0–2.0)
Bicarbonate: 18.3 mmol/L — ABNORMAL LOW (ref 20.0–28.0)
Bicarbonate: 19.6 mmol/L — ABNORMAL LOW (ref 20.0–28.0)
FIO2: 100
FIO2: 100
O2 Saturation: 73.2 %
O2 Saturation: 68.7 %
Patient temperature: 37
Patient temperature: 37
pCO2 arterial: 50.5 mmHg — ABNORMAL HIGH (ref 32.0–48.0)
pCO2 arterial: 46.5 mmHg (ref 32.0–48.0)
pH, Arterial: 7.224 — ABNORMAL LOW (ref 7.350–7.450)
pH, Arterial: 7.277 — ABNORMAL LOW (ref 7.350–7.450)
pO2, Arterial: 49.7 mmHg — ABNORMAL LOW (ref 83.0–108.0)
pO2, Arterial: 42.9 mmHg — ABNORMAL LOW (ref 83.0–108.0)

## 2021-03-07 LAB — COMPREHENSIVE METABOLIC PANEL
ALT: 28 U/L (ref 0–44)
AST: 54 U/L — ABNORMAL HIGH (ref 15–41)
Albumin: 2.7 g/dL — ABNORMAL LOW (ref 3.5–5.0)
Alkaline Phosphatase: 99 U/L (ref 38–126)
Anion gap: 12 (ref 5–15)
BUN: 31 mg/dL — ABNORMAL HIGH (ref 6–20)
CO2: 24 mmol/L (ref 22–32)
Calcium: 8.2 mg/dL — ABNORMAL LOW (ref 8.9–10.3)
Chloride: 102 mmol/L (ref 98–111)
Creatinine, Ser: 3.62 mg/dL — ABNORMAL HIGH (ref 0.44–1.00)
GFR, Estimated: 14 mL/min — ABNORMAL LOW (ref >60)
Glucose, Bld: 131 mg/dL — ABNORMAL HIGH (ref 70–99)
Potassium: 2.5 mmol/L — CL (ref 3.5–5.1)
Sodium: 138 mmol/L (ref 135–145)
Total Bilirubin: 1 mg/dL (ref 0.3–1.2)
Total Protein: 5.8 g/dL — ABNORMAL LOW (ref 6.5–8.1)

## 2021-03-07 LAB — CBC
HCT: 33.6 % — ABNORMAL LOW (ref 36.0–46.0)
Hemoglobin: 11 g/dL — ABNORMAL LOW (ref 12.0–15.0)
MCH: 31.7 pg (ref 26.0–34.0)
MCHC: 32.7 g/dL (ref 30.0–36.0)
MCV: 96.8 fL (ref 80.0–100.0)
Platelets: 126 10*3/uL — ABNORMAL LOW (ref 150–400)
RBC: 3.47 MIL/uL — ABNORMAL LOW (ref 3.87–5.11)
RDW: 14.5 % (ref 11.5–15.5)
WBC: 6.9 10*3/uL (ref 4.0–10.5)
nRBC: 0.4 % — ABNORMAL HIGH (ref 0.0–0.2)

## 2021-03-07 LAB — RESP PANEL BY RT-PCR (FLU A&B, COVID) ARPGX2
Influenza A by PCR: NEGATIVE
Influenza B by PCR: NEGATIVE
SARS Coronavirus 2 by RT PCR: NEGATIVE

## 2021-03-07 LAB — BRAIN NATRIURETIC PEPTIDE: B Natriuretic Peptide: 3450 pg/mL — ABNORMAL HIGH (ref 0.0–100.0)

## 2021-03-07 LAB — LACTIC ACID, PLASMA
Lactic Acid, Venous: 5.9 mmol/L (ref 0.5–1.9)
Lactic Acid, Venous: 3.5 mmol/L (ref 0.5–1.9)
Lactic Acid, Venous: 2.8 mmol/L (ref 0.5–1.9)

## 2021-03-07 LAB — GLUCOSE, CAPILLARY: Glucose-Capillary: 83 mg/dL (ref 70–99)

## 2021-03-07 LAB — TROPONIN I (HIGH SENSITIVITY)
Troponin I (High Sensitivity): 217 ng/L (ref <18)
Troponin I (High Sensitivity): 878 ng/L (ref <18)

## 2021-03-07 MED ORDER — ACETAMINOPHEN 160 MG/5ML PO SOLN
650.0000 mg | Freq: Four times a day (QID) | ORAL | Status: AC
  Administered 2021-03-07 – 2021-03-09 (×4): 650 mg
  Filled 2021-03-07 (×5): qty 20.3

## 2021-03-07 MED ORDER — PRISMASOL BGK 4/2.5 32-4-2.5 MEQ/L REPLACEMENT SOLN
Status: DC
  Filled 2021-03-07 (×13): qty 5000

## 2021-03-07 MED ORDER — PROPOFOL 1000 MG/100ML IV EMUL
5.0000 ug/kg/min | INTRAVENOUS | Status: DC
  Administered 2021-03-07: 5 ug/kg/min via INTRAVENOUS
  Administered 2021-03-07: 35 ug/kg/min via INTRAVENOUS
  Administered 2021-03-08: 30 ug/kg/min via INTRAVENOUS
  Filled 2021-03-07 (×5): qty 100

## 2021-03-07 MED ORDER — DOCUSATE SODIUM 50 MG/5ML PO LIQD
100.0000 mg | Freq: Two times a day (BID) | ORAL | Status: DC
  Administered 2021-03-07 – 2021-03-12 (×9): 100 mg
  Filled 2021-03-07 (×9): qty 10

## 2021-03-07 MED ORDER — HEPARIN SODIUM (PORCINE) 5000 UNIT/ML IJ SOLN
5000.0000 [IU] | Freq: Three times a day (TID) | INTRAMUSCULAR | Status: DC
  Administered 2021-03-08 – 2021-03-12 (×16): 5000 [IU] via SUBCUTANEOUS
  Filled 2021-03-07 (×16): qty 1

## 2021-03-07 MED ORDER — PRISMASOL BGK 4/2.5 32-4-2.5 MEQ/L REPLACEMENT SOLN
Status: DC
  Filled 2021-03-07 (×9): qty 5000

## 2021-03-07 MED ORDER — FENTANYL CITRATE (PF) 100 MCG/2ML IJ SOLN
100.0000 ug | Freq: Once | INTRAMUSCULAR | Status: DC
  Filled 2021-03-07: qty 2

## 2021-03-07 MED ORDER — SODIUM CHLORIDE 0.9 % IV SOLN
2.0000 g | Freq: Two times a day (BID) | INTRAVENOUS | Status: DC
  Administered 2021-03-08 – 2021-03-09 (×5): 2 g via INTRAVENOUS
  Filled 2021-03-07 (×5): qty 2

## 2021-03-07 MED ORDER — ORAL CARE MOUTH RINSE
15.0000 mL | OROMUCOSAL | Status: DC
  Administered 2021-03-08 – 2021-03-13 (×56): 15 mL via OROMUCOSAL

## 2021-03-07 MED ORDER — FUROSEMIDE 10 MG/ML IJ SOLN
60.0000 mg | Freq: Once | INTRAMUSCULAR | Status: AC
  Administered 2021-03-07: 60 mg via INTRAVENOUS
  Filled 2021-03-07: qty 6

## 2021-03-07 MED ORDER — CALCIUM CHLORIDE 10 % IV SOLN
1.0000 g | Freq: Once | INTRAVENOUS | Status: AC
  Administered 2021-03-07: 1 g via INTRAVENOUS

## 2021-03-07 MED ORDER — CHLORHEXIDINE GLUCONATE 0.12% ORAL RINSE (MEDLINE KIT)
15.0000 mL | Freq: Two times a day (BID) | OROMUCOSAL | Status: DC
  Administered 2021-03-07 – 2021-03-13 (×12): 15 mL via OROMUCOSAL

## 2021-03-07 MED ORDER — CALCIUM GLUCONATE 10 % IV SOLN
INTRAVENOUS | Status: AC
  Administered 2021-03-07: 4.65 meq
  Filled 2021-03-07: qty 10

## 2021-03-07 MED ORDER — HEPARIN SODIUM (PORCINE) 1000 UNIT/ML DIALYSIS
1000.0000 [IU] | INTRAMUSCULAR | Status: DC | PRN
  Administered 2021-03-11: 4000 [IU] via INTRAVENOUS_CENTRAL
  Filled 2021-03-07: qty 4
  Filled 2021-03-07: qty 3

## 2021-03-07 MED ORDER — ALBUTEROL SULFATE (2.5 MG/3ML) 0.083% IN NEBU: 2.5000 mg | INHALATION_SOLUTION | RESPIRATORY_TRACT | Status: DC | PRN

## 2021-03-07 MED ORDER — PANTOPRAZOLE SODIUM 40 MG IV SOLR
40.0000 mg | Freq: Every day | INTRAVENOUS | Status: DC
  Administered 2021-03-08 – 2021-03-13 (×6): 40 mg via INTRAVENOUS
  Filled 2021-03-07 (×6): qty 40

## 2021-03-07 MED ORDER — VANCOMYCIN HCL 750 MG/150ML IV SOLN
750.0000 mg | INTRAVENOUS | Status: DC
  Administered 2021-03-08: 750 mg via INTRAVENOUS
  Filled 2021-03-07: qty 150

## 2021-03-07 MED ORDER — FENTANYL 2500MCG IN NS 250ML (10MCG/ML) PREMIX INFUSION
0.0000 ug/h | INTRAVENOUS | Status: DC
Start: 2021-03-07 — End: 2021-03-14
  Administered 2021-03-08 – 2021-03-10 (×2): 50 ug/h via INTRAVENOUS
  Filled 2021-03-07 (×4): qty 250

## 2021-03-07 MED ORDER — SODIUM CHLORIDE 0.9 % IV SOLN
250.0000 [IU]/h | INTRAVENOUS | Status: DC
  Administered 2021-03-08: 250 [IU]/h via INTRAVENOUS_CENTRAL
  Filled 2021-03-07: qty 10000

## 2021-03-07 MED ORDER — HEPARIN (PORCINE) 2000 UNITS/L FOR CRRT
INTRAVENOUS_CENTRAL | Status: DC | PRN
  Filled 2021-03-07 (×2): qty 1000

## 2021-03-07 MED ORDER — CALCIUM GLUCONATE-NACL 1-0.675 GM/50ML-% IV SOLN
1.0000 g | Freq: Once | INTRAVENOUS | Status: AC
  Administered 2021-03-07: 1000 mg via INTRAVENOUS

## 2021-03-07 MED ORDER — NOREPINEPHRINE 4 MG/250ML-% IV SOLN
0.0000 ug/min | INTRAVENOUS | Status: DC
  Filled 2021-03-07: qty 250

## 2021-03-07 MED ORDER — SODIUM CHLORIDE 0.9 % IV BOLUS
1000.0000 mL | Freq: Once | INTRAVENOUS | Status: AC
  Administered 2021-03-07: 1000 mL via INTRAVENOUS

## 2021-03-07 MED ORDER — SODIUM BICARBONATE 8.4 % IV SOLN
50.0000 meq | Freq: Once | INTRAVENOUS | Status: AC
  Administered 2021-03-07: 50 meq via INTRAVENOUS

## 2021-03-07 MED ORDER — VANCOMYCIN HCL 1500 MG/300ML IV SOLN
1500.0000 mg | Freq: Once | INTRAVENOUS | Status: AC
  Administered 2021-03-07: 1500 mg via INTRAVENOUS
  Filled 2021-03-07: qty 300

## 2021-03-07 MED ORDER — CHLORHEXIDINE GLUCONATE CLOTH 2 % EX PADS
6.0000 | MEDICATED_PAD | Freq: Every day | CUTANEOUS | Status: DC
  Administered 2021-03-07 – 2021-03-09 (×4): 6 via TOPICAL

## 2021-03-07 MED ORDER — SODIUM CHLORIDE 0.9 % IV SOLN
1.0000 g | INTRAVENOUS | Status: DC
  Filled 2021-03-07: qty 1

## 2021-03-07 MED ORDER — POLYETHYLENE GLYCOL 3350 17 G PO PACK
17.0000 g | PACK | Freq: Every day | ORAL | Status: DC
  Administered 2021-03-09 – 2021-03-12 (×4): 17 g
  Filled 2021-03-07 (×4): qty 1

## 2021-03-07 MED ORDER — VANCOMYCIN VARIABLE DOSE PER UNSTABLE RENAL FUNCTION (PHARMACIST DOSING): Status: DC

## 2021-03-07 MED ORDER — HEPARIN BOLUS VIA INFUSION (CRRT)
1000.0000 [IU] | INTRAVENOUS | Status: DC | PRN
  Filled 2021-03-07: qty 1000

## 2021-03-07 MED ORDER — IOHEXOL 350 MG/ML SOLN
100.0000 mL | Freq: Once | INTRAVENOUS | Status: AC | PRN
  Administered 2021-03-07: 100 mL via INTRAVENOUS

## 2021-03-07 MED ORDER — PRISMASOL BGK 4/2.5 32-4-2.5 MEQ/L EC SOLN
Status: DC
  Filled 2021-03-07 (×52): qty 5000

## 2021-03-07 NOTE — ED Provider Notes (Signed)
[facility] EMERGENCY DEPARTMENT Provider Note   CSN: [id number] Arrival date & time: 03/10/2021  1703     History Chief Complaint  Patient presents with   unresponsive    Summer Cook is a 55 y.o. female.  Presented as a cardiac arrest by EMS.  Reported that she was at dialysis in her chair.  Unclear whether dialysis was started on.  She recently became unresponsive.  When they checked her pulses she was pulseless in PEA rhythm and CPR was initiated for about 30 minutes before EMS arrived.  Upon EMS arrival they state the rhythm was PEA, they continued CPR and gave her a dose of epinephrine and had return of circulation.  They intubated her with out any RSI and brought her to the ER.      No past medical history on file.  There are no problems to display for this patient.      OB History   No obstetric history on file.     No family history on file.     Home Medications Prior to Admission medications   Not on File    Allergies    Patient has no allergy information on record.  Review of Systems   Review of Systems  Unable to perform ROS: Intubated   Physical Exam Updated Vital Signs BP (!) 198/106   Pulse 80   Temp (!) 97.4 F (36.3 C) (Rectal)   Resp (!) 30   Ht 5\' 6"  (1.676 m)   Wt 77.1 kg   SpO2 97%   BMI 27.44 kg/m   Physical Exam Constitutional:      Appearance: She is ill-appearing and toxic-appearing.  HENT:     Head: Normocephalic.     Nose: Nose normal.  Eyes:     Comments: Pupils 3-4 and sluggish bilaterally.  Cardiovascular:     Rate and Rhythm: Normal rate.  Pulmonary:     Comments: Intubated being bagged Abdominal:     Palpations: Abdomen is soft.  Neurological:     Comments: Intubated, obtunded, no withdrawal to pain.    ED Results / Procedures / Treatments   Labs (all labs ordered are listed, but only abnormal results are displayed) Labs Reviewed  CBC WITH DIFFERENTIAL/PLATELET - Abnormal; Notable for the following  components:      Result Value   WBC 13.4 (*)    RBC 3.46 (*)    Hemoglobin 11.2 (*)    HCT 35.9 (*)    MCV 103.8 (*)    Platelets 110 (*)    Neutro Abs 9.9 (*)    Abs Immature Granulocytes 0.24 (*)    All other components within normal limits  BLOOD GAS, ARTERIAL - Abnormal; Notable for the following components:   pH, Arterial 7.224 (*)    pCO2 arterial 50.5 (*)    pO2, Arterial 49.7 (*)    Bicarbonate 18.3 (*)    Acid-base deficit 6.3 (*)    All other components within normal limits  CULTURE, BLOOD (ROUTINE X 2)  CULTURE, BLOOD (ROUTINE X 2)  RESP PANEL BY RT-PCR (FLU A&B, COVID) ARPGX2  COMPREHENSIVE METABOLIC PANEL  BRAIN NATRIURETIC PEPTIDE  LACTIC ACID, PLASMA  LACTIC ACID, PLASMA  TROPONIN I (HIGH SENSITIVITY)    EKG EKG Interpretation  Date/Time:  Friday March 07 2021 17:18:50 EDT Ventricular Rate:  101 PR Interval:  196 QRS Duration: 164 QT Interval:  387 QTC Calculation: 502 R Axis:   126 Text Interpretation: Sinus or ectopic atrial tachycardia Anterior  infarct, acute (LAD) >>> Acute MI <<< - appears similar to prior EKG's Confirmed by Thamas Jaegers ([id number]) on 03/21/2021 5:23:18 PM  Radiology DG Chest Port 1 View  Result Date: 03/21/2021 CLINICAL DATA:  Status post cardiac arrest and CPR EXAM: PORTABLE CHEST 1 VIEW COMPARISON:  06/21/2020 FINDINGS: Endotracheal tube is noted in satisfactory position. Gastric catheter is noted coiled within the stomach. Left jugular dialysis catheter is noted in satisfactory position. Cardiac shadow is enlarged. Small left pleural effusion is noted improved from the prior exam. Significant opacity is noted within both lungs right greater than left likely related to pulmonary edema given recent CPR. No pneumothorax is noted. No discrete rib fractures are seen. IMPRESSION: Tubes and lines as described in satisfactory position. Diffuse airspace disease throughout the right lung and to a lesser degree in the left lung likely related to  edema given the recent CPR. Electronically Signed   By: Inez Catalina M.D.   On: 03/08/2021 17:59    Procedures .Critical Care  Date/Time: 03/05/2021 6:33 PM Performed by: Luna Fuse, MD Authorized by: Luna Fuse, MD   Critical care provider statement:    Critical care time (minutes):  40   Critical care time was exclusive of:  Separately billable procedures and treating other patients   Critical care was necessary to treat or prevent imminent or life-threatening deterioration of the following conditions:  Cardiac failure, respiratory failure and circulatory failure Comments:        Date/Time: 03/21/2021 6:33 PM Performed by: Luna Fuse, MD Comments: 7.5 ET tube used for intubation.  No medication required the patient was listless.  Glide scope used.  Cords visualized and patient on first attempt.  Breath sounds equal bilaterally.  Color change seen in tube.  End-tidal CO2 monitor had appropriate color change.      Medications Ordered in ED Medications  propofol (DIPRIVAN) 1000 MG/100ML infusion (25 mcg/kg/min  77.1 kg Intravenous Rate/Dose Change 03/14/2021 1817)  norepinephrine (LEVOPHED) 4mg  in 244mL premix infusion (has no administration in time range)  calcium gluconate 1 g/ 50 mL sodium chloride IVPB (1,000 mg Intravenous New Bag/Given 03/06/2021 1753)  calcium chloride injection 1 g (1 g Intravenous Given 03/19/2021 1707)  sodium bicarbonate injection 50 mEq (50 mEq Intravenous Given 03/05/2021 1708)  sodium bicarbonate injection 50 mEq (50 mEq Intravenous Given 03/12/2021 1707)  sodium chloride 0.9 % bolus 1,000 mL (0 mLs Intravenous Stopped 02/27/2021 1804)  calcium gluconate 10 % injection (4.65 mEq  Given 03/20/2021 1753)    ED Course  I have reviewed the triage vital signs and the nursing notes.  Pertinent labs & imaging results that were available during my care of the patient were reviewed by me and considered in my medical decision making (see chart for details).    MDM  Rules/Calculators/A&P                          Patient had pulses on arrival to the ER.  Blood pressure was 628 systolic over 40.  Unclear from history whether patient initiated dialysis or had finished dialysis even.  Given IV calcium as well as 2 A of bicarb.  Patient was intubated here with 7.5 ET tube.  Maintained pressures above 90 systolic, IV fluids given, labs ordered.  ICU consultation requested.  Imaging ordered of the head and chest.   Final Clinical Impression(s) / ED Diagnoses Final diagnoses:  Cardiopulmonary arrest ([place])    Rx / DC  Orders ED Discharge Orders     None        Luna Fuse, MD 03/05/2021 [phone]

## 2021-03-07 NOTE — ED Notes (Signed)
Provider stated no CT angio at this time due to accesses.

## 2021-03-07 NOTE — Progress Notes (Signed)
Pharmacy Antibiotic Note  Pt starting CRRT; will adjust ABX.  Plan: Change vanc to 750mg  IV Q24H. Change cefepime to 2g IV Q12H.  Height: 5\' 6"  (167.6 cm) Weight: 81.4 kg (179 lb 7.3 oz) IBW/kg (Calculated) : 59.3  Temp (24hrs), Avg:97.6 F (36.4 C), Min:97.4 F (36.3 C), Max:97.7 F (36.5 C)  Recent Labs  Lab 03/27/2021 1754 02/28/2021 1756 03/02/2021 1924  WBC 13.4*  --   --   CREATININE 3.62*  --   --   LATICACIDVEN  --  5.9* 3.5*    Estimated Creatinine Clearance: 18.9 mL/min (A) (by C-G formula based on SCr of 3.62 mg/dL (H)).      Thank you for allowing pharmacy to be a part of this patient's care.  Wynona Neat, PharmD, BCPS  03/08/2021 11:27 PM

## 2021-03-07 NOTE — Progress Notes (Signed)
eLink Physician-Brief Progress Note Patient Name: Summer Cook DOB: 1965/10/24 MRN: [id number]   Date of Service  03/19/2021  HPI/Events of Note  Patient admitted via ED s/p out of hospital PEA cardiac arrest which occurred while she was receiving dialysis, 30 minutes of resuscitation before ROSC,  she is comatose and vented.  eICU Interventions  New Patient Evaluation.        [name] [name] 03/21/2021, 11:05 PM

## 2021-03-07 NOTE — Consult Note (Signed)
[facility] Renal Consultation Note    Indication for Consultation:  Management of ESRD/hemodialysis; anemia, hypertension/volume and secondary hyperparathyroidism  HPI: Summer Cook is a 55 y.o. female with a PMH significant for HTN, CHF, CAD, asthma, ITP, h/o PE and CVA, and ESRD on HD Mon/Fri at [facility] who was brought to [facility] ED via EMS after she suffered a PEA arrest at dialysis requiring 30 minutes of CPR before EMS arrived and given epi with ROSC.  She was transferred to [facility] ICU for further management.  In the ED she was noted to have WBC of 13.4, Hgb 11.2, platelets of 110, BNP 3450, covid test negative.  CXR with diffuse bilateral airspace disease R>L, CTA without evidence of PE.  She has been difficult to oxygenate since admission and we were consulted for possible CRRT for volume removal to aid in oxygenation.  It is not clear if she received HD and if so how much, however gien her K of 2.5 and BUN of 31 and Scr 3.62 she likely had some HD but remains volume overloaded on CXR.    Past Medical History:  Diagnosis Date   Allergy     Anxiety     Arthritis     Asthma     CAD (coronary artery disease), native coronary artery      a. cath 05/20/17 - 100% distal PDA --> medical mangement (no intervention done)   Candida esophagitis (HCC)     Chronic combined systolic and diastolic CHF (congestive heart failure) ([place])     Depression     End stage renal disease on dialysis ([place])      T/ Th/ Sat starting 4/28- [facility]   Essential hypertension     GERD (gastroesophageal reflux disease)     Gout     Headache     Hemorrhoids     History of kidney stones      passed   ITP (idiopathic thrombocytopenic purpura) 08/2010    Treated with Prednisone, IVIg (transient response), Rituxan (no response), Cytoxan (no response).  Last was Prednisone 43m; 2 wk in 10/2012.  She also was on Prednisone bridged with Cellcept briefly but stopped due to lack of insurance.    Myocardial infarction ([facility]     Perinephric hematoma     Pneumonia     Pulmonary embolism ([place] 04/2012   Serrated adenoma of colon     [place]      "when taking Prednisone"   Stroke ([place]     [place], oral     Family History:   No family history on file. Social History:  has no history on file for tobacco use, alcohol use, and drug use. Not on File Prior to Admission medications   Medication Sig Start Date End Date Taking? Authorizing Provider  FLUoxetine (PROZAC) 20 MG capsule Take 40 mg by mouth daily. 02/20/21  Yes [provider]  calcium acetate (PHOSLO) 667 MG capsule Take by mouth. 11/30/20   [provider]  furosemide (LASIX) 80 MG tablet Take 80 mg by mouth daily. 03/11/2021   [provider]  hydrALAZINE (APRESOLINE) 25 MG tablet Take 25 mg by mouth 3 (three) times daily. 11/07/20   [provider]  nitroGLYCERIN (NITROSTAT) 0.4 MG SL tablet Place under the tongue. 02/22/21   [provider]  Oxycodone HCl 10 MG TABS Take 10 mg by mouth every 4 (four) hours as needed. 02/14/21   [provider]  zolpidem (AMBIEN) 5 MG  tablet Take 5 mg by mouth at bedtime. 10/16/20   [provider]   Current Facility-Administered Medications  Medication Dose Route Frequency Provider Last Rate Last Admin   [START ON 03/08/2021]  prismasol BGK 4/2.5 infusion   CRRT Continuous Donato Heinz, MD       [START ON 03/08/2021]  prismasol BGK 4/2.5 infusion   CRRT Continuous Donato Heinz, MD       [START ON 03/08/2021] acetaminophen (TYLENOL) 160 MG/5ML solution 650 mg  650 mg Per Tube Q6H Jennelle Human B, NP       albuterol (PROVENTIL) (2.5 MG/3ML) 0.083% nebulizer solution 2.5 mg  2.5 mg Nebulization Q4H PRN Jennelle Human B, NP       ceFEPIme (MAXIPIME) 1 g in sodium chloride 0.9 % 100 mL IVPB  1 g Intravenous Q24H Icard, Bradley L, DO       chlorhexidine gluconate (MEDLINE KIT) (PERIDEX) 0.12 % solution 15 mL   15 mL Mouth Rinse BID Icard, Bradley L, DO   15 mL at 03/15/2021 2216   [START ON 03/08/2021] Chlorhexidine Gluconate Cloth 2 % PADS 6 each  6 each Topical Daily Icard, Bradley L, DO   6 each at 02/26/2021 2225   docusate (COLACE) 50 MG/5ML liquid 100 mg  100 mg Per Tube BID Jennelle Human B, NP       fentaNYL (SUBLIMAZE) injection 100 mcg  100 mcg Intravenous Once Hong, Greggory Brandy, MD       fentaNYL 2552mg in NS 2520m(1072mml) infusion-PREMIX  0-400 mcg/hr Intravenous Continuous Hong, JosGreggory BrandyD       [START ON 03/08/2021] heparin 10,000 units/ 20 mL infusion syringe  250-3,000 Units/hr CRRT Continuous ColDonato HeinzD       heparin bolus via infusion syringe 1,000 Units  1,000 Units CRRT PRN ColDonato HeinzD       heparin injection 1,000-6,000 Units  1,000-6,000 Units CRRT PRN ColDonato HeinzD       heparin injection 5,000 Units  5,000 Units Subcutaneous Q8H SimJennelle Human NP       heparinized saline (2000 units/L) primer fluid for CRRT   CRRT PRN ColDonato HeinzD       [START ON 03/08/2021] MEDLINE mouth rinse  15 mL Mouth Rinse 10 times per day Icard, BraLeory Plowman DO       [START ON 03/08/2021] pantoprazole (PROTONIX) injection 40 mg  40 mg Intravenous Daily SimJennelle Human NP       [START ON 03/08/2021] polyethylene glycol (MIRALAX / GLYCOLAX) packet 17 g  17 g Per Tube Daily SimJennelle Human NP       [START ON 03/08/2021] prismasol BGK 4/2.5 infusion   CRRT Continuous ColDonato HeinzD       propofol (DIPRIVAN) 1000 MG/100ML infusion  5-80 mcg/kg/min Intravenous Continuous HonLuna FuseD 16.19 mL/hr at 03/26/2021 2024 35 mcg/kg/min at 03/14/2021 2024   vancomycin (VANCOREADY) IVPB 1500 mg/300 mL  1,500 mg Intravenous Once Icard, Bradley L, DO 150 mL/hr at 03/09/2021 2300 1,500 mg at 03/11/2021 2300   vancomycin variable dose per unstable renal function (pharmacist dosing)   Does not apply See admin instructions IcaGarner NashO       Labs: Basic Metabolic  Panel: Recent Labs  Lab 03/26/2021 1754  NA 138  K 2.5*  CL 102  CO2 24  GLUCOSE 131*  BUN 31*  CREATININE 3.62*  CALCIUM 8.2*   Liver Function Tests: Recent Labs  Lab 03/04/2021 1754  AST 54*  ALT 28  ALKPHOS 99  BILITOT 1.0  PROT 5.8*  ALBUMIN 2.7*   No results for input(s): LIPASE, AMYLASE in the last 168 hours. No results for input(s): AMMONIA in the last 168 hours. CBC: Recent Labs  Lab 03/19/2021 1754  WBC 13.4*  NEUTROABS 9.9*  HGB 11.2*  HCT 35.9*  MCV 103.8*  PLT 110*   Cardiac Enzymes: No results for input(s): CKTOTAL, CKMB, CKMBINDEX, TROPONINI in the last 168 hours. CBG: Recent Labs  Lab 03/12/2021 2209  GLUCAP 83   Iron Studies: No results for input(s): IRON, TIBC, TRANSFERRIN, FERRITIN in the last 72 hours. Studies/Results: CT Head Wo Contrast  Result Date: 03/27/2021 CLINICAL DATA:  Recent CPR and unresponsive state, initial encounter EXAM: CT HEAD WITHOUT CONTRAST TECHNIQUE: Contiguous axial images were obtained from the base of the skull through the vertex without intravenous contrast. COMPARISON:  None. FINDINGS: Brain: No evidence of acute infarction, hemorrhage, hydrocephalus, extra-axial collection or mass lesion/mass effect. Mild atrophic changes are noted. Vascular: No hyperdense vessel or unexpected calcification. Skull: Normal. Negative for fracture or focal lesion. Sinuses/Orbits: Mild air-fluid levels are noted within the maxillary and sphenoid sinuses bilaterally. Other: None IMPRESSION: Somewhat limited by patient motion artifact. Mild atrophic changes. No acute intracranial abnormality is noted. Electronically Signed   By: Inez Catalina M.D.   On: 03/06/2021 19:51   CT Angio Chest PE W and/or Wo Contrast  Result Date: 02/28/2021 CLINICAL DATA:  Status post CPR, evaluate for pulmonary embolism EXAM: CT ANGIOGRAPHY CHEST WITH CONTRAST TECHNIQUE: Multidetector CT imaging of the chest was performed using the standard protocol during bolus  administration of intravenous contrast. Multiplanar CT image reconstructions and MIPs were obtained to evaluate the vascular anatomy. CONTRAST:  121m OMNIPAQUE IOHEXOL 350 MG/ML SOLN COMPARISON:  Chest x-ray from earlier in the same day, CT from 01/06/2019. FINDINGS: Cardiovascular: Thoracic aorta demonstrates a normal branching pattern. No aneurysmal dilatation or dissection is seen. Heart is enlarged in size. Pulmonary artery is well visualized with a normal branching pattern. Mild motion artifact somewhat limits the distal branches although no pulmonary emboli are identified. No significant coronary calcifications are noted. Left jugular dialysis catheter is noted in satisfactory position. Mediastinum/Nodes: Thoracic inlet is within normal limits. Endotracheal tube and gastric catheter are noted in satisfactory position. The esophagus as visualized is within normal limits. No sizable hilar or mediastinal adenopathy is noted. Lungs/Pleura: Lungs demonstrate diffuse multifocal consolidation worst in the bases bilaterally. Large left pleural effusion is noted. Parenchymal edema is identified right greater than left similar to that seen on recent plain film examination. No pneumothorax is noted. Upper Abdomen: Visualized upper abdomen demonstrates some nodularity of the liver suggestive of underlying cirrhosis. Remainder of the upper abdomen is within normal limits. Musculoskeletal: Motion artifact somewhat limits evaluation although there are a few anterior rib fractures identified on the right involving the second, third, fourth and fifth ribs. Similar angulated rib fractures are noted anteriorly on the left. Stable T10 compression deformity is noted unchanged from 2020. Review of the MIP images confirms the above findings. IMPRESSION: Bilateral consolidation primarily in the lower lobes with associated diffuse pulmonary edema right greater than left similar to that seen on prior plain film. Large left pleural  effusion. No evidence of pulmonary emboli. Changes consistent with cirrhosis of the liver. Tubes and lines in satisfactory position. Multiple rib fractures related to the known CPR. Electronically Signed   By: MInez CatalinaM.D.   On: 03/06/2021 19:49   DG  CHEST PORT 1 VIEW  Result Date: 03/17/2021 CLINICAL DATA:  Status post intubation EXAM: PORTABLE CHEST 1 VIEW COMPARISON:  Film from earlier in the same day. FINDINGS: Endotracheal tube is again identified in satisfactory position. Gastric catheter extends into the stomach. Dialysis catheter is again seen and stable. Persistent bilateral airspace opacities are noted right considerably greater than left similar to that seen on prior exam as well as the recent chest CT. The known rib fractures are not well appreciated on this exam. IMPRESSION: Relatively stable appearance of the chest when compared with the prior study. Electronically Signed   By: Inez Catalina M.D.   On: 03/12/2021 22:54   DG Chest Port 1 View  Result Date: 03/14/2021 CLINICAL DATA:  Status post cardiac arrest and CPR EXAM: PORTABLE CHEST 1 VIEW COMPARISON:  06/21/2020 FINDINGS: Endotracheal tube is noted in satisfactory position. Gastric catheter is noted coiled within the stomach. Left jugular dialysis catheter is noted in satisfactory position. Cardiac shadow is enlarged. Small left pleural effusion is noted improved from the prior exam. Significant opacity is noted within both lungs right greater than left likely related to pulmonary edema given recent CPR. No pneumothorax is noted. No discrete rib fractures are seen. IMPRESSION: Tubes and lines as described in satisfactory position. Diffuse airspace disease throughout the right lung and to a lesser degree in the left lung likely related to edema given the recent CPR. Electronically Signed   By: Inez Catalina M.D.   On: 03/02/2021 17:59    ROS: Review of systems not obtained due to patient factors. Physical Exam: Vitals:   03/27/2021  2204 03/05/2021 2205 03/21/2021 2215 03/16/2021 2306  BP: 114/70  114/70 109/66  Pulse: 74 74 72 69  Resp: (!) 32 (!) 32 (!) 35 (!) 35  Temp:  97.7 F (36.5 C)    TempSrc:  Oral    SpO2: (!) 79% (!) 78% 90% 95%  Weight:  81.4 kg    Height:          Weight change:   Intake/Output Summary (Last 24 hours) at 03/17/2021 2313 Last data filed at 03/21/2021 1904 Gross per 24 hour  Intake 358.87 ml  Output --  Net 358.87 ml   BP 109/66   Pulse 69   Temp 97.7 F (36.5 C) (Oral)   Resp (!) 35   Ht _0  (1.676 m)   Wt 81.4 kg   SpO2 95%   BMI 28.96 kg/m  General appearance: intubated and sedated Head: Normocephalic, without obvious abnormality, atraumatic Resp: mechanically ventilated BS bilaterally Cardio: regular rate and rhythm, S1, S2 normal, no murmur, click, rub or gallop GI: soft, non-tender; bowel sounds normal; no masses,  no organomegaly Extremities: extremities normal, atraumatic, no cyanosis or edema Dialysis Access: [facility]  Dialysis Orders: Center: [facility]  on Mondays and Fridays. EDW 75.5 HD Bath 2K/2.5Ca  Time 210 minutes Heparin 1000 units bolus then 500 units/hr. Access L IJ TDC BFR 400 DFR 500     Assessment/Plan:  Cardiac arrest - PEA arrest requiring 30 minutes of CPR.  Presumably due to hypoxia.  Trend troponins per PCCM and keep MAPS >65 per PCCM.  ECHO in am Acute hypoxic respiratory failure - requiring 100% FiO2 and 8 of PEEP.  CXR with bilateral infiltrates.  Given difficulty with oxygenation will plan to initiate CRRT with UF to help decrease oxygen requirements.  ESRD -  as above plan for CRRT with UF 50-150 ml/hr, will use 4K/2.5Ca bath for dialysate,  as well as pre and post filter replacement fluids  Hypertension/volume  - Evidence of pulmonary edema on CXR and elevated BNP.  UF with CRRT as tolerated.  May require pressors with UF.  Anemia  - Hgb stable, no epo on outpatient orders  Metabolic bone disease -  hold meds for now  Nutrition - npo  for now.  Donetta Potts, MD [place] Pager [phone] 03/05/2021, 11:13 PM

## 2021-03-07 NOTE — H&P (Signed)
NAME:  Summer Cook, MRN:  [id number], DOB:  June 28, 1966, LOS: 0 ADMISSION DATE:  03/02/2021, CONSULTATION DATE:  03/20/2021 REFERRING MD:  Dr. Almyra Free, CHIEF COMPLAINT:   Cardiac arrest  History of Present Illness:  This is a 55 yo female Allergy, Asthma, CAD, CHF, ESRD (T/TH/S [facility]), HTN, GERD, ITP, PE and stroke who presents sp Cardiac arrest. Arrested during dialysis. Was unresponsive requiring CPR for 30 minutes. 1 of EPI given by EMS. EMS placed king airway as well. They than transitioned to ETT without need for RSI.   Per daughterNot having issues at home. Had upset  Stomach all night just had started last night. .No fevers. No cough. Sinus's were bothering her.   In ED labs and imaging pertinent for: SBS showing mild leukocytosis of 13.4 with stable hgb of 11.2 and platelets of 110. {Potassium of 2.5, Cr of 3.62, Tropnin of 217, BNP of 3450. Covid testing negative. CXR pertinent for Diffuse airspace throughout right lung. CTA without evidef PE. There is bilateral consolidative proces. Right greater than left. Also ggo with interlobular septal thickening concerning for volume overload. CT head with no acute intracranial abnormalities.  On EKG there was concern for anterior MI however patietn wwith chornic LBB.   Pertinent  Medical History  Allergy Asthma  CAD,  CHF,  ESRD (T/TH/S [facility]),  HTN,  GERD ITP PE  stroke  Significant Hospital Events: Including procedures, antibiotic start and stop dates in addition to other pertinent events   Patient transferred from [facility] on 02/27/2021  Interim History / Subjective:  Not applicable  Objective   Blood pressure 134/72, pulse 80, temperature (!) 97.4 F (36.3 C), temperature source Rectal, resp. rate (!) 36, height 5\' 6"  (1.676 m), weight 77.1 kg, SpO2 (!) 74 %.    Vent Mode: PRVC FiO2 (%):  [100 %] 100 % Set Rate:  [18 bmp-35 bmp] 35 bmp Vt Set:  [470 mL-500 mL] 470 mL PEEP:  [5 cmH20-8 cmH20] 5 cmH20    Intake/Output Summary (Last 24 hours) at 03/22/2021 2053 Last data filed at 02/26/2021 1904 Gross per 24 hour  Intake 358.87 ml  Output --  Net 358.87 ml   Filed Weights   03/20/2021 1710  Weight: 77.1 kg    Examination: General: Patietn sedated and intubated HENT: Bloody secretions form the ET tube noted.  Lungs: Coarse mechainical breath sounds that are diminshed in bilateral lower lobes.  Cardiovascular: RRR, no mrumurs appreciated Abdomen: Soft non tender. Non distended Extremities: No gross deformites. Warm and well perfuse Neuro: Sedated not following commands. Triggers vent no spontaeous movements noted GU: Deferred   Labs/imaging that I havepersonally reviewed  (right click and "Reselect all SmartList Selections" daily)  CTA chest from 03/17/2021  Bilateral consolidation primarily in the lower lobes with associated diffuse pulmonary edema right greater than left similar to that seen on prior plain film.   Large left pleural effusion.   No evidence of pulmonary emboli.   Changes consistent with cirrhosis of the liver.   Tubes and lines in satisfactory position.   Multiple rib fractures related to the known CPR.  CT head  from 03/19/2021 IMPRESSION: Somewhat limited by patient motion artifact.   Mild atrophic changes.   No acute intracranial abnormality is noted.  Resolved Hospital Problem list   NA  Assessment & Plan:  This is a 55 year old female with history as noted above who presented with cardiac arrest. Rhythm not reported.   Cardiac arrest-Etiology likely hypoxia  given bilaterally infiltrates. However daughter doe snot note any respiratory complaints. Only diarrhea the night before. However will continue to trend trops and EKG to help further elucidate if any cardiac contributor. Negative for PE at OSH. Ct head with no acute intracranial abnormality.  -Maintain MAPS greater than 65 -Consider repeat head imaging at 72 hours for neuro prognostication  purposes. -Normothermia -Scheduled Tylenol as long as LFTs are reassuring -Blood culture ,urine cultures, respiratory cultures -Trend troponins -Repeat EKG -Echocardiogram in the a.m. -If any abnormalities in the above we will start heparin drip and consider consult to cardiology -Trend lactic -Stat CMP/CBC/Phos/mag -Ct abdomen with contrast given abdominal complaints  Combined systolic / diastolic heart failure - Pt was volume overloaded and will be treated with hemodialysis.  Appreciate assistance of nephrology team. -Repeat echocardiogram -Dialysis per Nephro  Acute hypoxic respiratory failure-patient on 100% FiO2 and 8 of PEEP on admission. Not having SOB at home per daughter. Had some diarrhea -Broad-spectrum antibiotics with vancomycin and cefepime -Chest x-ray on arrival -Scheduled airway clearance -Stat ABG on arrival  ESRD-TTHS-Has perm cath in place -Have consulted nephrology as patient appears overloaded and is not oxygenating well.   -Plan to initiate dailysis overnight  CAD-with chronically occluded PDA managed medically Myoview 2020 without ischemia otherwise no significant obstructive disease by cardiac catheterization in 2018.   Best practice (right click and "Reselect all SmartList Selections" daily)  Diet:  NPO Pain/Anxiety/Delirium protocol (if indicated): Yes (RASS goal -1) VAP protocol (if indicated): Yes DVT prophylaxis: Subcutaneous Heparin GI prophylaxis: H2B Glucose control:  SSI Yes Central venous access:  Yes, and it is still needed Arterial line:  N/A Foley:  Yes, and it is still needed Mobility:  bed rest  PT consulted: N/A Last date of multidisciplinary goals of care discussion [6/11. Spoke with daughter. She would like to keep full code for now.  Code Status:  full code.  Disposition: ICU  Labs   CBC: Recent Labs  Lab 03/02/2021 1754  WBC 13.4*  NEUTROABS 9.9*  HGB 11.2*  HCT 35.9*  MCV 103.8*  PLT 110*    Basic Metabolic  Panel: Recent Labs  Lab 03/16/2021 1754  NA 138  K 2.5*  CL 102  CO2 24  GLUCOSE 131*  BUN 31*  CREATININE 3.62*  CALCIUM 8.2*   GFR: Estimated Creatinine Clearance: 18.4 mL/min (A) (by C-G formula based on SCr of 3.62 mg/dL (H)). Recent Labs  Lab 02/28/2021 1754 02/27/2021 1756 03/03/2021 1924  WBC 13.4*  --   --   LATICACIDVEN  --  5.9* 3.5*    Liver Function Tests: Recent Labs  Lab 03/16/2021 1754  AST 54*  ALT 28  ALKPHOS 99  BILITOT 1.0  PROT 5.8*  ALBUMIN 2.7*   No results for input(s): LIPASE, AMYLASE in the last 168 hours. No results for input(s): AMMONIA in the last 168 hours.  ABG    Component Value Date/Time   PHART 7.277 (L) 03/05/2021 1946   PCO2ART 46.5 02/27/2021 1946   PO2ART 42.9 (L) 03/27/2021 1946   HCO3 19.6 (L) 03/01/2021 1946   ACIDBASEDEF 4.7 (H) 03/09/2021 1946   O2SAT 68.7 03/11/2021 1946     Coagulation Profile: No results for input(s): INR, PROTIME in the last 168 hours.  Cardiac Enzymes: No results for input(s): CKTOTAL, CKMB, CKMBINDEX, TROPONINI in the last 168 hours.  HbA1C: No results found for: HGBA1C  CBG: No results for input(s): GLUCAP in the last 168 hours.  Review of Systems:   Unable  to obtain given patient's status.  Pertinent positives and negatives per HPI  Past Medical History:  She,  has no past medical history on file.   Surgical History:     Social History:      Family History:  Her family history is not on file.   Allergies Not on File   Home Medications  Prior to Admission medications   Medication Sig Start Date End Date Taking? Authorizing Provider  FLUoxetine (PROZAC) 20 MG capsule Take 40 mg by mouth daily. 02/20/21  Yes [provider]  calcium acetate (PHOSLO) 667 MG capsule Take by mouth. 11/30/20   [provider]  furosemide (LASIX) 80 MG tablet Take 80 mg by mouth daily. 03/16/2021   [provider]  hydrALAZINE (APRESOLINE) 25 MG tablet Take 25 mg by mouth 3  (three) times daily. 11/07/20   [provider]  nitroGLYCERIN (NITROSTAT) 0.4 MG SL tablet Place under the tongue. 02/22/21   [provider]  Oxycodone HCl 10 MG TABS Take 10 mg by mouth every 4 (four) hours as needed. 02/14/21   [provider]  zolpidem (AMBIEN) 5 MG tablet Take 5 mg by mouth at bedtime. 10/16/20   [provider]     Critical care time: 55 minutes

## 2021-03-07 NOTE — ED Notes (Signed)
Transported with nurse to CT

## 2021-03-07 NOTE — ED Triage Notes (Signed)
Pt here by [facility] from dialysis post CPR with pulses palpiable, unresponsive; 27min CPR at facility prior to EMS arrival; 1 epi given by EMS with 5 mins CPR, king airway in place upon arrival, EDP at bedside

## 2021-03-07 NOTE — ED Provider Notes (Signed)
[facility] EMERGENCY DEPARTMENT Provider Note   CSN: [id number] Arrival date & time: 03/17/2021  1703     History Chief Complaint  Patient presents with   unresponsive    Summer Cook is a 55 y.o. female.  Patient brought in from dialysis by EMS.  Unsure if she completed dialysis or not, reported that she became unresponsive in her dialysis chair.  CPR was performed for 30 minutes prior to EMS arrival.  Upon EMS arrival rhythm was PEA.  They gave her 1 dose of epinephrine and continued CPR for about 5 minutes and had return of circulation.  Patient was intubated in the field with a King airway prior to arrival here in the ER.      No past medical history on file.  Patient Active Problem List   Diagnosis Date Noted   Cardiac arrest ([place]) 03/08/2021       OB History   No obstetric history on file.     No family history on file.     Home Medications Prior to Admission medications   Medication Sig Start Date End Date Taking? Authorizing Provider  FLUoxetine (PROZAC) 20 MG capsule Take 40 mg by mouth daily. 02/20/21  Yes [provider]  calcium acetate (PHOSLO) 667 MG capsule Take by mouth. 11/30/20   [provider]  furosemide (LASIX) 80 MG tablet Take 80 mg by mouth daily. 03/22/2021   [provider]  hydrALAZINE (APRESOLINE) 25 MG tablet Take 25 mg by mouth 3 (three) times daily. 11/07/20   [provider]  nitroGLYCERIN (NITROSTAT) 0.4 MG SL tablet Place under the tongue. 02/22/21   [provider]  Oxycodone HCl 10 MG TABS Take 10 mg by mouth every 4 (four) hours as needed. 02/14/21   [provider]  zolpidem (AMBIEN) 5 MG tablet Take 5 mg by mouth at bedtime. 10/16/20   [provider]    Allergies    Patient has no allergy information on record.  Review of Systems   Review of Systems  Unable to perform ROS: Acuity of condition   Physical Exam Updated Vital Signs BP 109/66   Pulse 69   Temp 97.7 F  (36.5 C) (Oral)   Resp (!) 35   Ht _0  (1.676 m)   Wt 81.4 kg   SpO2 95%   BMI 28.96 kg/m   Physical Exam Constitutional:      General: She is in acute distress.     Appearance: She is ill-appearing.  HENT:     Head: Normocephalic.     Right Ear: External ear normal.     Left Ear: External ear normal.  Eyes:     Conjunctiva/sclera: Conjunctivae normal.  Cardiovascular:     Rate and Rhythm: Normal rate.  Pulmonary:     Comments: Intubated, being bagged Abdominal:     Palpations: Abdomen is soft.  Neurological:     Comments: Severely obtunded, intubated no withdrawal to pain.    ED Results / Procedures / Treatments   Labs (all labs ordered are listed, but only abnormal results are displayed) Labs Reviewed  CBC WITH DIFFERENTIAL/PLATELET - Abnormal; Notable for the following components:      Result Value   WBC 13.4 (*)    RBC 3.46 (*)    Hemoglobin 11.2 (*)    HCT 35.9 (*)    MCV 103.8 (*)    Platelets 110 (*)    Neutro Abs 9.9 (*)    Abs Immature Granulocytes 0.24 (*)  All other components within normal limits  COMPREHENSIVE METABOLIC PANEL - Abnormal; Notable for the following components:   Potassium 2.5 (*)    Glucose, Bld 131 (*)    BUN 31 (*)    Creatinine, Ser 3.62 (*)    Calcium 8.2 (*)    Total Protein 5.8 (*)    Albumin 2.7 (*)    AST 54 (*)    GFR, Estimated 14 (*)    All other components within normal limits  BRAIN NATRIURETIC PEPTIDE - Abnormal; Notable for the following components:   B Natriuretic Peptide 3,450.0 (*)    All other components within normal limits  BLOOD GAS, ARTERIAL - Abnormal; Notable for the following components:   pH, Arterial 7.224 (*)    pCO2 arterial 50.5 (*)    pO2, Arterial 49.7 (*)    Bicarbonate 18.3 (*)    Acid-base deficit 6.3 (*)    All other components within normal limits  LACTIC ACID, PLASMA - Abnormal; Notable for the following components:   Lactic Acid, Venous 5.9 (*)    All other components within  normal limits  LACTIC ACID, PLASMA - Abnormal; Notable for the following components:   Lactic Acid, Venous 3.5 (*)    All other components within normal limits  BLOOD GAS, ARTERIAL - Abnormal; Notable for the following components:   pH, Arterial 7.277 (*)    pO2, Arterial 42.9 (*)    Bicarbonate 19.6 (*)    Acid-base deficit 4.7 (*)    All other components within normal limits  TROPONIN I (HIGH SENSITIVITY) - Abnormal; Notable for the following components:   Troponin I (High Sensitivity) 217 (*)    All other components within normal limits  TROPONIN I (HIGH SENSITIVITY) - Abnormal; Notable for the following components:   Troponin I (High Sensitivity) 878 (*)    All other components within normal limits  RESP PANEL BY RT-PCR (FLU A&B, COVID) ARPGX2  CULTURE, BLOOD (ROUTINE X 2)  CULTURE, BLOOD (ROUTINE X 2)  MRSA PCR SCREENING  URINE CULTURE  CULTURE, RESPIRATORY W GRAM STAIN  GLUCOSE, CAPILLARY  URINALYSIS, ROUTINE W REFLEX MICROSCOPIC  LACTIC ACID, PLASMA  LACTIC ACID, PLASMA  COMPREHENSIVE METABOLIC PANEL  MAGNESIUM  PHOSPHORUS  CBC  CBC  COMPREHENSIVE METABOLIC PANEL  RENAL FUNCTION PANEL  MAGNESIUM  APTT  TROPONIN I (HIGH SENSITIVITY)    EKG EKG Interpretation  Date/Time:  Friday March 07 2021 17:18:50 EDT Ventricular Rate:  101 PR Interval:  196 QRS Duration: 164 QT Interval:  387 QTC Calculation: 502 R Axis:   126 Text Interpretation: Sinus or ectopic atrial tachycardia Anterior infarct, acute (LAD) >>> Acute MI <<< - appears similar to prior EKG's Confirmed by Thamas Jaegers ([id number]) on 03/08/2021 5:23:18 PM  Radiology CT Head Wo Contrast  Result Date: 03/12/2021 CLINICAL DATA:  Recent CPR and unresponsive state, initial encounter EXAM: CT HEAD WITHOUT CONTRAST TECHNIQUE: Contiguous axial images were obtained from the base of the skull through the vertex without intravenous contrast. COMPARISON:  None. FINDINGS: Brain: No evidence of acute infarction, hemorrhage,  hydrocephalus, extra-axial collection or mass lesion/mass effect. Mild atrophic changes are noted. Vascular: No hyperdense vessel or unexpected calcification. Skull: Normal. Negative for fracture or focal lesion. Sinuses/Orbits: Mild air-fluid levels are noted within the maxillary and sphenoid sinuses bilaterally. Other: None IMPRESSION: Somewhat limited by patient motion artifact. Mild atrophic changes. No acute intracranial abnormality is noted. Electronically Signed   By: Inez Catalina M.D.   On: 03/22/2021 19:51   CT  Angio Chest PE W and/or Wo Contrast  Result Date: 03/08/2021 CLINICAL DATA:  Status post CPR, evaluate for pulmonary embolism EXAM: CT ANGIOGRAPHY CHEST WITH CONTRAST TECHNIQUE: Multidetector CT imaging of the chest was performed using the standard protocol during bolus administration of intravenous contrast. Multiplanar CT image reconstructions and MIPs were obtained to evaluate the vascular anatomy. CONTRAST:  146m OMNIPAQUE IOHEXOL 350 MG/ML SOLN COMPARISON:  Chest x-ray from earlier in the same day, CT from 01/06/2019. FINDINGS: Cardiovascular: Thoracic aorta demonstrates a normal branching pattern. No aneurysmal dilatation or dissection is seen. Heart is enlarged in size. Pulmonary artery is well visualized with a normal branching pattern. Mild motion artifact somewhat limits the distal branches although no pulmonary emboli are identified. No significant coronary calcifications are noted. Left jugular dialysis catheter is noted in satisfactory position. Mediastinum/Nodes: Thoracic inlet is within normal limits. Endotracheal tube and gastric catheter are noted in satisfactory position. The esophagus as visualized is within normal limits. No sizable hilar or mediastinal adenopathy is noted. Lungs/Pleura: Lungs demonstrate diffuse multifocal consolidation worst in the bases bilaterally. Large left pleural effusion is noted. Parenchymal edema is identified right greater than left similar to  that seen on recent plain film examination. No pneumothorax is noted. Upper Abdomen: Visualized upper abdomen demonstrates some nodularity of the liver suggestive of underlying cirrhosis. Remainder of the upper abdomen is within normal limits. Musculoskeletal: Motion artifact somewhat limits evaluation although there are a few anterior rib fractures identified on the right involving the second, third, fourth and fifth ribs. Similar angulated rib fractures are noted anteriorly on the left. Stable T10 compression deformity is noted unchanged from 2020. Review of the MIP images confirms the above findings. IMPRESSION: Bilateral consolidation primarily in the lower lobes with associated diffuse pulmonary edema right greater than left similar to that seen on prior plain film. Large left pleural effusion. No evidence of pulmonary emboli. Changes consistent with cirrhosis of the liver. Tubes and lines in satisfactory position. Multiple rib fractures related to the known CPR. Electronically Signed   By: MInez CatalinaM.D.   On: 03/27/2021 19:49   DG CHEST PORT 1 VIEW  Result Date: 03/01/2021 CLINICAL DATA:  Status post intubation EXAM: PORTABLE CHEST 1 VIEW COMPARISON:  Film from earlier in the same day. FINDINGS: Endotracheal tube is again identified in satisfactory position. Gastric catheter extends into the stomach. Dialysis catheter is again seen and stable. Persistent bilateral airspace opacities are noted right considerably greater than left similar to that seen on prior exam as well as the recent chest CT. The known rib fractures are not well appreciated on this exam. IMPRESSION: Relatively stable appearance of the chest when compared with the prior study. Electronically Signed   By: MInez CatalinaM.D.   On: 03/24/2021 22:54   DG Chest Port 1 View  Result Date: 03/16/2021 CLINICAL DATA:  Status post cardiac arrest and CPR EXAM: PORTABLE CHEST 1 VIEW COMPARISON:  06/21/2020 FINDINGS: Endotracheal tube is noted  in satisfactory position. Gastric catheter is noted coiled within the stomach. Left jugular dialysis catheter is noted in satisfactory position. Cardiac shadow is enlarged. Small left pleural effusion is noted improved from the prior exam. Significant opacity is noted within both lungs right greater than left likely related to pulmonary edema given recent CPR. No pneumothorax is noted. No discrete rib fractures are seen. IMPRESSION: Tubes and lines as described in satisfactory position. Diffuse airspace disease throughout the right lung and to a lesser degree in the left lung likely related  to edema given the recent CPR. Electronically Signed   By: Inez Catalina M.D.   On: 03/24/2021 17:59    Procedures .Critical Care  Date/Time: 03/24/2021 11:22 PM Performed by: Luna Fuse, MD Authorized by: Luna Fuse, MD   Critical care provider statement:    Critical care time (minutes):  70   Critical care time was exclusive of:  Separately billable procedures and treating other patients and teaching time   Critical care was necessary to treat or prevent imminent or life-threatening deterioration of the following conditions:  Cardiac failure and respiratory failure   Medications Ordered in ED Medications  propofol (DIPRIVAN) 1000 MG/100ML infusion (35 mcg/kg/min  77.1 kg Intravenous Transfusing/Transfer 03/27/2021 2215)  fentaNYL (SUBLIMAZE) injection 100 mcg (0 mcg Intravenous Hold 03/06/2021 2044)  fentaNYL 2565mg in NS 2577m(1067mml) infusion-PREMIX (75 mcg/hr Intravenous Transfusing/Transfer 03/01/2021 2215)  Chlorhexidine Gluconate Cloth 2 % PADS 6 each (6 each Topical Given 02/28/2021 2225)  chlorhexidine gluconate (MEDLINE KIT) (PERIDEX) 0.12 % solution 15 mL (15 mLs Mouth Rinse Given 03/02/2021 2216)  MEDLINE mouth rinse (has no administration in time range)  ceFEPIme (MAXIPIME) 1 g in sodium chloride 0.9 % 100 mL IVPB (has no administration in time range)  vancomycin (VANCOREADY) IVPB 1500 mg/300 mL  (1,500 mg Intravenous New Bag/Given 03/20/2021 2300)  vancomycin variable dose per unstable renal function (pharmacist dosing) (has no administration in time range)  docusate (COLACE) 50 MG/5ML liquid 100 mg (has no administration in time range)  polyethylene glycol (MIRALAX / GLYCOLAX) packet 17 g (has no administration in time range)  pantoprazole (PROTONIX) injection 40 mg (has no administration in time range)  albuterol (PROVENTIL) (2.5 MG/3ML) 0.083% nebulizer solution 2.5 mg (has no administration in time range)  heparin injection 5,000 Units (has no administration in time range)  heparin injection 1,000-6,000 Units (has no administration in time range)  heparin 10,000 units/ 20 mL infusion syringe (has no administration in time range)  heparin bolus via infusion syringe 1,000 Units (has no administration in time range)  heparinized saline (2000 units/L) primer fluid for CRRT (has no administration in time range)   prismasol BGK 4/2.5 infusion (has no administration in time range)   prismasol BGK 4/2.5 infusion (has no administration in time range)  prismasol BGK 4/2.5 infusion (has no administration in time range)  acetaminophen (TYLENOL) 160 MG/5ML solution 650 mg (has no administration in time range)  calcium chloride injection 1 g (1 g Intravenous Given 03/03/2021 1707)  sodium bicarbonate injection 50 mEq (50 mEq Intravenous Given 03/06/2021 1708)  sodium bicarbonate injection 50 mEq (50 mEq Intravenous Given 03/24/2021 1707)  sodium chloride 0.9 % bolus 1,000 mL (0 mLs Intravenous Stopped 03/21/2021 1804)  calcium gluconate 1 g/ 50 mL sodium chloride IVPB (0 g Intravenous Stopped 02/28/2021 1904)  calcium gluconate 10 % injection (4.65 mEq  Given 03/12/2021 1753)  iohexol (OMNIPAQUE) 350 MG/ML injection 100 mL (100 mLs Intravenous Contrast Given 03/04/2021 1929)  furosemide (LASIX) injection 60 mg (60 mg Intravenous Given 02/26/2021 2044)    ED Course  I have reviewed the triage vital signs and the  nursing notes.  Pertinent labs & imaging results that were available during my care of the patient were reviewed by me and considered in my medical decision making (see chart for details).    MDM Rules/Calculators/A&P                          Was intubated here in  the ER.  No additional CPR was required as she had pulses upon arrival to the ER.  EKG shows wide bundle branch block consistent with prior EKGs. Given history suspected possible hyperkalemia as a cause.  Given calcium gluconate and bicarb empirically.   Labs showed low potassium.   After intubation she was satting 98% on the vent.  However over time her O2 saturations began to drop into the 70s and 60s. Patient given IV Lasix.  Respiratory therapist noticed blood coming out of the ET tube, suctioned multiple times with bloody sputum being suctioned out from the ET tube which I suspect may be contributing to her low oxygenation on the vent.  Case discussed with ICU physician at [facility], recommended increasing sedation as the patient was breathing above the vent.  After sedation was increased O2 saturation appeared to improve as well.  Troponin noted to be elevated, however given the bleeding from her ET tube decided against heparin at this time.   Final Clinical Impression(s) / ED Diagnoses Final diagnoses:  Cardiopulmonary arrest [facility])    Rx / DC Orders ED Discharge Orders     None        Luna Fuse, MD 03/25/2021 2324

## 2021-03-07 NOTE — ED Notes (Signed)
Fentanyl drip given to [facility]- they will start in route

## 2021-03-07 NOTE — ED Notes (Signed)
Date and time results received: 03/02/2021 2017  Test: TROPONIN Critical Value: 878  Name of Provider Notified: Oris Drone MD

## 2021-03-07 NOTE — ED Notes (Signed)
Summer Cook [phone]

## 2021-03-07 NOTE — Progress Notes (Signed)
Pharmacy Antibiotic Note  Summer Cook is a 55 y.o. female admitted on 02/28/2021 with pneumonia.  Pharmacy has been consulted for Vanc and Cefepime dosing. Patient has ESRD.  Plan: Vanc 1500 mg IV x 1, then vanc variable dosing based until inpatient dialysis scheduled is established Cefepime 1 gm IV q24hr Monitor dialysis schedule, clinical status, C&S and vanc levels as needed.   Height: 5\' 6"  (167.6 cm) Weight: 81.4 kg (179 lb 7.3 oz) IBW/kg (Calculated) : 59.3  Temp (24hrs), Avg:97.6 F (36.4 C), Min:97.4 F (36.3 C), Max:97.7 F (36.5 C)  Recent Labs  Lab 03/12/2021 1754 03/20/2021 1756 03/26/2021 1924  WBC 13.4*  --   --   CREATININE 3.62*  --   --   LATICACIDVEN  --  5.9* 3.5*    Estimated Creatinine Clearance: 18.9 mL/min (A) (by C-G formula based on SCr of 3.62 mg/dL (H)).    Not on File  Antimicrobials this admission: Vanc 6/10 >>  Cefepime 6/10 >>   Thank you for allowing pharmacy to be a part of this patient's care.  Alanda Slim, PharmD, [facility] Clinical Pharmacist Please see AMION for all Pharmacists' Contact Phone Numbers 03/08/2021, 10:25 PM

## 2021-03-07 NOTE — ED Notes (Signed)
[facility] here to transport pt 

## 2021-03-07 NOTE — ED Notes (Signed)
Dr Almyra Free at bedside - family at bedside.

## 2021-03-07 NOTE — Progress Notes (Signed)
Turned rate up to 35 while in Cat Scan. Blood gas retrieved on Rate of 35

## 2021-03-07 NOTE — ED Notes (Signed)
7.5 ET tube placed, confirmed with + Co2 , places at 25 at the lip.

## 2021-03-07 NOTE — Progress Notes (Signed)
Respiratory culture collected and sent to Main Lab with requisition.

## 2021-03-08 ENCOUNTER — Other Ambulatory Visit: Payer: Self-pay

## 2021-03-08 ENCOUNTER — Encounter (HOSPITAL_COMMUNITY): Payer: Self-pay | Admitting: Pulmonary Disease

## 2021-03-08 ENCOUNTER — Inpatient Hospital Stay (HOSPITAL_COMMUNITY): Payer: Medicare Other

## 2021-03-08 DIAGNOSIS — R579 Shock, unspecified: Secondary | ICD-10-CM

## 2021-03-08 DIAGNOSIS — R569 Unspecified convulsions: Secondary | ICD-10-CM | POA: Diagnosis not present

## 2021-03-08 DIAGNOSIS — N186 End stage renal disease: Secondary | ICD-10-CM | POA: Diagnosis not present

## 2021-03-08 DIAGNOSIS — Z992 Dependence on renal dialysis: Secondary | ICD-10-CM | POA: Diagnosis not present

## 2021-03-08 LAB — COMPREHENSIVE METABOLIC PANEL
ALT: 29 U/L (ref 0–44)
ALT: 26 U/L (ref 0–44)
AST: 74 U/L — ABNORMAL HIGH (ref 15–41)
AST: 75 U/L — ABNORMAL HIGH (ref 15–41)
Albumin: 2.4 g/dL — ABNORMAL LOW (ref 3.5–5.0)
Albumin: 2.3 g/dL — ABNORMAL LOW (ref 3.5–5.0)
Alkaline Phosphatase: 82 U/L (ref 38–126)
Alkaline Phosphatase: 70 U/L (ref 38–126)
Anion gap: 14 (ref 5–15)
Anion gap: 17 — ABNORMAL HIGH (ref 5–15)
BUN: 31 mg/dL — ABNORMAL HIGH (ref 6–20)
BUN: 25 mg/dL — ABNORMAL HIGH (ref 6–20)
CO2: 20 mmol/L — ABNORMAL LOW (ref 22–32)
CO2: 14 mmol/L — ABNORMAL LOW (ref 22–32)
Calcium: 8.1 mg/dL — ABNORMAL LOW (ref 8.9–10.3)
Calcium: 6.9 mg/dL — ABNORMAL LOW (ref 8.9–10.3)
Chloride: 103 mmol/L (ref 98–111)
Chloride: 96 mmol/L — ABNORMAL LOW (ref 98–111)
Creatinine, Ser: 3.92 mg/dL — ABNORMAL HIGH (ref 0.44–1.00)
Creatinine, Ser: 3.09 mg/dL — ABNORMAL HIGH (ref 0.44–1.00)
GFR, Estimated: 13 mL/min — ABNORMAL LOW (ref >60)
GFR, Estimated: 17 mL/min — ABNORMAL LOW (ref >60)
Glucose, Bld: 104 mg/dL — ABNORMAL HIGH (ref 70–99)
Glucose, Bld: 75 mg/dL (ref 70–99)
Potassium: 2.4 mmol/L — CL (ref 3.5–5.1)
Potassium: 2.7 mmol/L — CL (ref 3.5–5.1)
Sodium: 137 mmol/L (ref 135–145)
Sodium: 127 mmol/L — ABNORMAL LOW (ref 135–145)
Total Bilirubin: 1.5 mg/dL — ABNORMAL HIGH (ref 0.3–1.2)
Total Bilirubin: 1.5 mg/dL — ABNORMAL HIGH (ref 0.3–1.2)
Total Protein: 5.1 g/dL — ABNORMAL LOW (ref 6.5–8.1)
Total Protein: 5 g/dL — ABNORMAL LOW (ref 6.5–8.1)

## 2021-03-08 LAB — RENAL FUNCTION PANEL
Albumin: 2.5 g/dL — ABNORMAL LOW (ref 3.5–5.0)
Anion gap: 16 — ABNORMAL HIGH (ref 5–15)
BUN: 21 mg/dL — ABNORMAL HIGH (ref 6–20)
CO2: 21 mmol/L — ABNORMAL LOW (ref 22–32)
Calcium: 8 mg/dL — ABNORMAL LOW (ref 8.9–10.3)
Chloride: 99 mmol/L (ref 98–111)
Creatinine, Ser: 2.74 mg/dL — ABNORMAL HIGH (ref 0.44–1.00)
GFR, Estimated: 20 mL/min — ABNORMAL LOW (ref >60)
Glucose, Bld: 109 mg/dL — ABNORMAL HIGH (ref 70–99)
Phosphorus: 3.1 mg/dL (ref 2.5–4.6)
Potassium: 4.3 mmol/L (ref 3.5–5.1)
Sodium: 136 mmol/L (ref 135–145)

## 2021-03-08 LAB — POCT I-STAT 7, (LYTES, BLD GAS, ICA,H+H)
Acid-base deficit: 5 mmol/L — ABNORMAL HIGH (ref 0.0–2.0)
Acid-base deficit: 3 mmol/L — ABNORMAL HIGH (ref 0.0–2.0)
Bicarbonate: 22 mmol/L (ref 20.0–28.0)
Bicarbonate: 22.9 mmol/L (ref 20.0–28.0)
Calcium, Ion: 1.07 mmol/L — ABNORMAL LOW (ref 1.15–1.40)
Calcium, Ion: 1.07 mmol/L — ABNORMAL LOW (ref 1.15–1.40)
HCT: 39 % (ref 36.0–46.0)
HCT: 37 % (ref 36.0–46.0)
Hemoglobin: 13.3 g/dL (ref 12.0–15.0)
Hemoglobin: 12.6 g/dL (ref 12.0–15.0)
O2 Saturation: 86 %
O2 Saturation: 100 %
Patient temperature: 95.3
Patient temperature: 95.3
Potassium: 3.1 mmol/L — ABNORMAL LOW (ref 3.5–5.1)
Potassium: 3 mmol/L — ABNORMAL LOW (ref 3.5–5.1)
Sodium: 139 mmol/L (ref 135–145)
Sodium: 139 mmol/L (ref 135–145)
TCO2: 23 mmol/L (ref 22–32)
TCO2: 24 mmol/L (ref 22–32)
pCO2 arterial: 42.7 mmHg (ref 32.0–48.0)
pCO2 arterial: 40 mmHg (ref 32.0–48.0)
pH, Arterial: 7.311 — ABNORMAL LOW (ref 7.350–7.450)
pH, Arterial: 7.357 (ref 7.350–7.450)
pO2, Arterial: 51 mmHg — ABNORMAL LOW (ref 83.0–108.0)
pO2, Arterial: 198 mmHg — ABNORMAL HIGH (ref 83.0–108.0)

## 2021-03-08 LAB — CBC
HCT: 32.5 % — ABNORMAL LOW (ref 36.0–46.0)
Hemoglobin: 12 g/dL (ref 12.0–15.0)
MCH: 37.2 pg — ABNORMAL HIGH (ref 26.0–34.0)
MCHC: 36.9 g/dL — ABNORMAL HIGH (ref 30.0–36.0)
MCV: 100.6 fL — ABNORMAL HIGH (ref 80.0–100.0)
Platelets: 100 10*3/uL — ABNORMAL LOW (ref 150–400)
RBC: 3.23 MIL/uL — ABNORMAL LOW (ref 3.87–5.11)
RDW: 13.7 % (ref 11.5–15.5)
WBC: 6.2 10*3/uL (ref 4.0–10.5)
nRBC: 0.5 % — ABNORMAL HIGH (ref 0.0–0.2)

## 2021-03-08 LAB — POCT ACTIVATED CLOTTING TIME
Activated Clotting Time: 150 seconds
Activated Clotting Time: 155 seconds
Activated Clotting Time: 155 seconds
Activated Clotting Time: 161 seconds
Activated Clotting Time: 173 seconds
Activated Clotting Time: 179 seconds
Activated Clotting Time: 190 seconds
Activated Clotting Time: 190 seconds
Activated Clotting Time: 196 seconds

## 2021-03-08 LAB — LACTIC ACID, PLASMA: Lactic Acid, Venous: 3 mmol/L (ref 0.5–1.9)

## 2021-03-08 LAB — TROPONIN I (HIGH SENSITIVITY)
Troponin I (High Sensitivity): 3881 ng/L (ref <18)
Troponin I (High Sensitivity): 3302 ng/L (ref <18)

## 2021-03-08 LAB — MAGNESIUM
Magnesium: 1.7 mg/dL (ref 1.7–2.4)
Magnesium: 1.7 mg/dL (ref 1.7–2.4)
Magnesium: 2.4 mg/dL (ref 1.7–2.4)
Magnesium: 2.5 mg/dL — ABNORMAL HIGH (ref 1.7–2.4)

## 2021-03-08 LAB — GLUCOSE, CAPILLARY
Glucose-Capillary: 79 mg/dL (ref 70–99)
Glucose-Capillary: 112 mg/dL — ABNORMAL HIGH (ref 70–99)
Glucose-Capillary: 66 mg/dL — ABNORMAL LOW (ref 70–99)
Glucose-Capillary: 72 mg/dL (ref 70–99)
Glucose-Capillary: 64 mg/dL — ABNORMAL LOW (ref 70–99)
Glucose-Capillary: 107 mg/dL — ABNORMAL HIGH (ref 70–99)
Glucose-Capillary: 104 mg/dL — ABNORMAL HIGH (ref 70–99)
Glucose-Capillary: 106 mg/dL — ABNORMAL HIGH (ref 70–99)

## 2021-03-08 LAB — PROTIME-INR
INR: 1.3 — ABNORMAL HIGH (ref 0.8–1.2)
Prothrombin Time: 16.5 seconds — ABNORMAL HIGH (ref 11.4–15.2)

## 2021-03-08 LAB — PHOSPHORUS
Phosphorus: 4.1 mg/dL (ref 2.5–4.6)
Phosphorus: 3.2 mg/dL (ref 2.5–4.6)
Phosphorus: 3.2 mg/dL (ref 2.5–4.6)
Phosphorus: 3.1 mg/dL (ref 2.5–4.6)

## 2021-03-08 LAB — APTT: aPTT: 45 seconds — ABNORMAL HIGH (ref 24–36)

## 2021-03-08 LAB — MRSA PCR SCREENING: MRSA by PCR: NEGATIVE

## 2021-03-08 MED ORDER — HEPARIN (PORCINE) IN NACL 2-0.9 UNITS/ML: INTRAMUSCULAR | Status: DC | PRN

## 2021-03-08 MED ORDER — NOREPINEPHRINE 4 MG/250ML-% IV SOLN
2.0000 ug/min | INTRAVENOUS | Status: DC
Start: 2021-03-08 — End: 2021-03-08
  Administered 2021-03-08: 12 ug/min via INTRAVENOUS
  Administered 2021-03-08: 4 ug/min via INTRAVENOUS
  Filled 2021-03-08 (×3): qty 250

## 2021-03-08 MED ORDER — MIDAZOLAM HCL 2 MG/2ML IJ SOLN
INTRAMUSCULAR | Status: AC
  Administered 2021-03-08: 2 mg via INTRAVENOUS
  Filled 2021-03-08: qty 2

## 2021-03-08 MED ORDER — PROSOURCE TF PO LIQD
45.0000 mL | Freq: Two times a day (BID) | ORAL | Status: DC
  Administered 2021-03-08 – 2021-03-09 (×3): 45 mL
  Filled 2021-03-08 (×3): qty 45

## 2021-03-08 MED ORDER — SODIUM CHLORIDE 0.9 % IV SOLN
4500.0000 mg | Freq: Once | INTRAVENOUS | Status: AC
  Administered 2021-03-08: 4500 mg via INTRAVENOUS
  Filled 2021-03-08: qty 45

## 2021-03-08 MED ORDER — LORAZEPAM 2 MG/ML IJ SOLN
2.0000 mg | INTRAMUSCULAR | Status: DC | PRN
  Administered 2021-03-08 – 2021-03-13 (×6): 2 mg via INTRAVENOUS
  Filled 2021-03-08 (×6): qty 1

## 2021-03-08 MED ORDER — DEXTROSE 50 % IV SOLN
12.5000 g | INTRAVENOUS | Status: AC
  Administered 2021-03-08: 12.5 g via INTRAVENOUS
  Filled 2021-03-08: qty 50

## 2021-03-08 MED ORDER — DEXTROSE 10 % IV SOLN: INTRAVENOUS | Status: DC

## 2021-03-08 MED ORDER — POTASSIUM CHLORIDE 20 MEQ PO PACK
40.0000 meq | PACK | Freq: Once | ORAL | Status: AC
  Administered 2021-03-08: 40 meq
  Filled 2021-03-08: qty 2

## 2021-03-08 MED ORDER — MAGNESIUM SULFATE 2 GM/50ML IV SOLN
2.0000 g | Freq: Once | INTRAVENOUS | Status: AC
  Administered 2021-03-08: 2 g via INTRAVENOUS
  Filled 2021-03-08: qty 50

## 2021-03-08 MED ORDER — VITAL HIGH PROTEIN PO LIQD
1000.0000 mL | ORAL | Status: DC
  Administered 2021-03-08 – 2021-03-09 (×2): 1000 mL

## 2021-03-08 MED ORDER — NOREPINEPHRINE 4 MG/250ML-% IV SOLN: 0.0000 ug/min | INTRAVENOUS | Status: DC

## 2021-03-08 MED ORDER — POTASSIUM CHLORIDE 10 MEQ/100ML IV SOLN
10.0000 meq | INTRAVENOUS | Status: AC
  Administered 2021-03-08 (×2): 10 meq via INTRAVENOUS
  Filled 2021-03-08 (×2): qty 100

## 2021-03-08 MED ORDER — MIDAZOLAM HCL 2 MG/2ML IJ SOLN: 2.0000 mg | INTRAMUSCULAR | Status: AC

## 2021-03-08 MED ORDER — DEXTROSE 50 % IV SOLN
INTRAVENOUS | Status: AC
  Administered 2021-03-08: 50 mL
  Filled 2021-03-08: qty 50

## 2021-03-08 MED ORDER — NOREPINEPHRINE 16 MG/250ML-% IV SOLN
0.0000 ug/min | INTRAVENOUS | Status: DC
  Administered 2021-03-08: 14 ug/min via INTRAVENOUS
  Administered 2021-03-09: 11 ug/min via INTRAVENOUS
  Administered 2021-03-10: 6 ug/min via INTRAVENOUS
  Administered 2021-03-11: 14 ug/min via INTRAVENOUS
  Administered 2021-03-11: 11 ug/min via INTRAVENOUS
  Administered 2021-03-12: 10 ug/min via INTRAVENOUS
  Filled 2021-03-08 (×6): qty 250

## 2021-03-08 MED ORDER — MIDAZOLAM HCL 2 MG/2ML IJ SOLN: 2.0000 mg | Freq: Once | INTRAMUSCULAR | Status: AC

## 2021-03-08 MED ORDER — LEVETIRACETAM IN NACL 1000 MG/100ML IV SOLN
1000.0000 mg | Freq: Two times a day (BID) | INTRAVENOUS | Status: DC
  Administered 2021-03-09 – 2021-03-13 (×9): 1000 mg via INTRAVENOUS
  Filled 2021-03-08 (×14): qty 100

## 2021-03-08 MED ORDER — SODIUM CHLORIDE 0.9 % IV SOLN
250.0000 mL | INTRAVENOUS | Status: DC
  Administered 2021-03-08 – 2021-03-09 (×3): 250 mL via INTRAVENOUS

## 2021-03-08 NOTE — Progress Notes (Addendum)
NAME:  Summer Cook, MRN:  [id number], DOB:  [birth year], LOS: 1 ADMISSION DATE:  03/24/2021, CONSULTATION DATE:  03/18/2021 REFERRING MD:  Dr. Almyra Free, CHIEF COMPLAINT:   Cardiac arrest  Brief Narrative:  This is a 55 yo who presents sp Cardiac arrest. Arrested during dialysis. Was unresponsive requiring CPR for 30 minutes. 1 of EPI given by EMS. EMS placed king airway as well transitioned to ETT without need for RSI.   Pertinent  Medical History  Allergy Asthma  CAD,  CHF,  ESRD (T/TH/S [facility]),  HTN,  GERD ITP PE  stroke  Significant Hospital Events: Including procedures, antibiotic start and stop dates in addition to other pertinent events   6/10 Patient transferred from [facility] after suffering cardiac arrest at outpatient HD session. 30 mins of ACLS prior to EMS arrival. [facility] airway exchanged for ETT in ED with no RSI meds  6/11 Issues with oxygenation overnight required initiation of low-dose Levophed due to hypotension on CRRT.  Mildly hypothermic and hypoglycemic  Interim History / Subjective:  Patient seen mildly hypotensive and hypothermic overnight.  Continues to have issues with oxygenation requiring elevated ventilator settings.  Attempted to remove volume per CRRT  Objective   Blood pressure 113/71, pulse (!) 54, temperature (!) 95.3 F (35.2 C), temperature source Axillary, resp. rate (!) 28, height _0  (1.676 m), weight 81.4 kg, SpO2 100 %.    Vent Mode: PRVC FiO2 (%):  [60 %-100 %] 80 % Set Rate:  [18 bmp-35 bmp] 35 bmp Vt Set:  [470 mL-500 mL] 470 mL PEEP:  [5 cmH20-10 cmH20] 10 cmH20 Plateau Pressure:  [27 cmH20-33 cmH20] 29 cmH20   Intake/Output Summary (Last 24 hours) at 03/08/2021 0657 Last data filed at 03/08/2021 0600 Gross per 24 hour  Intake 1067.04 ml  Output 950 ml  Net 117.04 ml    Filed Weights   03/08/2021 1710 03/03/2021 2205 03/08/21 0127  Weight: 77.1 kg 81.4 kg 81.4 kg    Examination: General: Acute on chronic ill-appearing  middle-aged female lying in bed on mechanical ventilation in no acute distress HEENT: ETT, MM pink/moist, PERRL,  Neuro: Sedated on ventilator with fentanyl and propofol CV: s1s2 regular rate and rhythm, no murmur, rubs, or gallops,  PULM: Tolerating ventilator, no added breath currently requiring 60 FiO2, 10 of PEEP, and respiratory rate of 35 with SPO2 of 90 GI: soft, bowel sounds hypoactive in all 4 quadrants, non-tender, non-distended Extremities: warm/dry, no edema  Skin: no rashes or lesions  Labs/imaging that I havepersonally reviewed   CTA chest from 03/11/2021 Bilateral consolidation primarily in the lower lobes with associated diffuse pulmonary edema right greater than left similar to that seen on prior plain film. Large left pleural effusion. Cirrhosis. Multiple rib fractures  CT head  from 03/04/2021 Somewhat limited by patient motion artifact. Mild atrophic changes.  Resolved Hospital Problem list   NA  Assessment & Plan:  Cardiac arrest -Etiology likely hypoxia given bilaterally infiltrates. However daughter does not note any respiratory complaints. Only diarrhea the night before. Negative for PE at OSH. Ct head with no acute intracranial abnormality.  Circulatory shock -30 min before CPR initiated  P: Normothermia Consider repeat head CT in 72 hr  Continuous telemetry  Continue pressors for MAP goal > 65 Trend troponins  Follow ECHO Follow cultures  Combined systolic / diastolic heart failure  - Pt was volume overloaded and will be treated with hemodialysis.  Appreciate assistance of nephrology team. -BNP 3,450 P: Volume removal per  HD  Continuous telemetry  Strict intake and output  Optimize electrolytes  ECHO pending  If central access obtained check coox  Acute hypoxic respiratory failure -patient on 100% FiO2 and 8 of PEEP on admission. Not having SOB at home per daughter. Had some diarrhea -P/F ratio 248 27% mortality  P: Continue ventilator support with  lung protective strategies  Wean PEEP and FiO2 for sats greater than 90%. Head of bed elevated 30 degrees. Plateau pressures less than 30 cm H20.  Follow intermittent chest x-ray and ABG.   SAT/SBT as tolerated, mentation preclude extubation  Ensure adequate pulmonary hygiene  Follow cultures  VAP bundle in place  PAD protocol Continue broad spectrum antibiotics   ESRD -TTHS-Has perm cath in place P: Follow renal function  Monitor urine output Trend Bmet Avoid nephrotoxins Ensure adequate renal perfusion   CAD -with chronically occluded PDA managed medically Myoview 2020 without ischemia otherwise no significant obstructive disease by cardiac catheterization in 2018. P: Supportive care   Hypokalemia Hyponatremia P: Supplement as needed  Trend Bmet  Hypoglycemia P: Continue D10 drip  Best practice   Diet:  NPO Pain/Anxiety/Delirium protocol (if indicated): Yes (RASS goal -1) VAP protocol (if indicated): Yes DVT prophylaxis: Subcutaneous Heparin GI prophylaxis: H2B Glucose control:  SSI Yes Central venous access:  Yes, and it is still needed Arterial line:  N/A Foley:  Yes, and it is still needed Mobility:  bed rest  PT consulted: N/A Last date of multidisciplinary goals of care discussion [6/11. Spoke with daughter. She would like to keep full code for now.  Code Status:  full code.  Disposition: ICU    Critical care time:    Performed by: Johnsie Cancel  Total critical care time: 40 minutes  Critical care time was exclusive of separately billable procedures and treating other patients.  Critical care was necessary to treat or prevent imminent or life-threatening deterioration.  Critical care was time spent personally by me on the following activities: development of treatment plan with patient and/or surrogate as well as nursing, discussions with consultants, evaluation of patient's response to treatment, examination of patient, obtaining history from  patient or surrogate, ordering and performing treatments and interventions, ordering and review of laboratory studies, ordering and review of radiographic studies, pulse oximetry and re-evaluation of patient's condition.  Johnsie Cancel, NP-C [facility] Personal contact information can be found on Amion  03/08/2021, 7:31 AM   Pulmonary critical care attending:  This is a 55 year old female, multitude of medical problems at baseline, end-stage renal disease Tuesday Thursday Saturday dialysis, hypertension, reflux, ITP, PE, history of stroke, coronary artery disease.  Patient had a cardiac arrest at dialysis unit yesterday with approximately 30 minutes of CPR, patient was intubated without RSI.  BP (!) 95/48   Pulse (!) 52   Temp (!) 95.3 F (35.2 C) (Axillary)   Resp (!) 0   Ht _0  (1.676 m)   Wt 81.4 kg   SpO2 99%   BMI 28.96 kg/m   General: Elderly female intubated mechanical life support appears older than stated age. HEENT: Endotracheal tube in place, pupils nonreactive, history of cataract surgery Heart: Regular rhythm S1-S2 Lungs: Bilateral mechanically ventilated breath sounds Extremities: No significant edema.  Labs: Reviewed  Assessment: Cardiac arrest Acute on chronic systolic diastolic heart failure Shock, multifactorial secondary to above, SIRS response postarrest Acute hypoxemic respiratory failure requiring intubation mechanical ventilation ESRD, dialysis Tuesday Thursday Saturday, now on CVVHD CAD Hypokalemia Hyponatremia Hypoglycemia.  Acute metabolic encephalopathy, possible anoxic encephalopathy postcardiac arrest  Plan: Remains on Bair hugger for temp control Continue to support patient postarrest. I Am most concerned with her neurologic status We will continue to hold sedation Remains on PAD guidelines sedation, low-dose fentanyl. If she has no meaningful recovery by tomorrow may consider EEG and MRI of the brain. Continue  CVVHD for the time being. She is on low-dose peripheral norepinephrine to maintain mean arterial pressure above 65. She may need central venous access. We will try to titrate off of pressors if tolerated. SAT SBT as able, mentation precludes at this time. Start tube feeds  I met with patient's 2 daughters at bedside.  We discussed her previous medical comorbidities as well as her risk of adverse event and prognosis related to out-of-hospital cardiac arrest.  They are working to process all of this new information.  This patient is critically ill with multiple organ system failure; which, requires frequent high complexity decision making, assessment, support, evaluation, and titration of therapies. This was completed through the application of advanced monitoring technologies and extensive interpretation of multiple databases. During this encounter critical care time was devoted to patient care services described in this note for 45 minutes.   Garner Nash, DO [facility] 03/08/2021 11:30 AM

## 2021-03-08 NOTE — Progress Notes (Addendum)
Patient ID: Gemma Spraker, female   DOB: 04/25/1966, 55 y.o.   MRN: [id number] S: Able to wean FiO2 from 100% to 60% overnight, however she is hypotensive and stared on levophed.  Pt was seen and examined while on CRRT.  Labs and orders removed and adjustments made. O:BP 104/64   Pulse (!) 52   Temp (!) 95.3 F (35.2 C) (Axillary)   Resp (!) 0   Ht 5' 6" (1.676 m)   Wt 81.4 kg   SpO2 99%   BMI 28.96 kg/m   Intake/Output Summary (Last 24 hours) at 03/08/2021 0919 Last data filed at 03/08/2021 0900 Gross per 24 hour  Intake 1217.1 ml  Output 1428 ml  Net -210.9 ml   Intake/Output: I/O last 3 completed shifts: In: 1116.7 [I.V.:298; NG/GT:60; IV Piggyback:758.7] Out: 1160 [Emesis/NG output:350; Other:810]  Intake/Output this shift:  Total I/O In: 100.4 [I.V.:100.4] Out: 268 [Other:268] Weight change:  Gen:intubated and sedated CVS:bradycardic at 52 Resp:mechanically ventilated BS bilaterally Abd:hypoactive BS Ext:1+ bilateral lower extremity edema  Recent Labs  Lab 03/17/2021 1754 03/15/2021 2230 03/08/21 0338 03/08/21 0405 03/08/21 0620  NA 138 137 127* 139 139  K 2.5* 2.4* 2.7* 3.1* 3.0*  CL 102 103 96*  --   --   CO2 24 20* 14*  --   --   GLUCOSE 131* 104* 75  --   --   BUN 31* 31* 25*  --   --   CREATININE 3.62* 3.92* 3.09*  --   --   ALBUMIN 2.7* 2.4* 2.3*  --   --   CALCIUM 8.2* 8.1* 6.9*  --   --   PHOS  --  4.1 3.2  --   --   AST 54* 74* 75*  --   --   ALT 28 29 26  --   --    Liver Function Tests: Recent Labs  Lab 03/25/2021 1754 03/27/2021 2230 03/08/21 0338  AST 54* 74* 75*  ALT 28 29 26  ALKPHOS 99 82 70  BILITOT 1.0 1.5* 1.5*  PROT 5.8* 5.1* 5.0*  ALBUMIN 2.7* 2.4* 2.3*   No results for input(s): LIPASE, AMYLASE in the last 168 hours. No results for input(s): AMMONIA in the last 168 hours. CBC: Recent Labs  Lab 03/18/2021 1754 02/27/2021 2230 03/08/21 0338 03/08/21 0405 03/08/21 0620  WBC 13.4* 6.9 6.2  --   --   NEUTROABS 9.9*  --   --   --    --   HGB 11.2* 11.0* 12.0 13.3 12.6  HCT 35.9* 33.6* 32.5* 39.0 37.0  MCV 103.8* 96.8 100.6*  --   --   PLT 110* 126* 100*  --   --    Cardiac Enzymes: No results for input(s): CKTOTAL, CKMB, CKMBINDEX, TROPONINI in the last 168 hours. CBG: Recent Labs  Lab 03/10/2021 2209 03/08/21 0411 03/08/21 0657 03/08/21 0726  GLUCAP 83 79 66* 112*    Iron Studies: No results for input(s): IRON, TIBC, TRANSFERRIN, FERRITIN in the last 72 hours. Studies/Results: CT Head Wo Contrast  Result Date: 03/20/2021 CLINICAL DATA:  Recent CPR and unresponsive state, initial encounter EXAM: CT HEAD WITHOUT CONTRAST TECHNIQUE: Contiguous axial images were obtained from the base of the skull through the vertex without intravenous contrast. COMPARISON:  None. FINDINGS: Brain: No evidence of acute infarction, hemorrhage, hydrocephalus, extra-axial collection or mass lesion/mass effect. Mild atrophic changes are noted. Vascular: No hyperdense vessel or unexpected calcification. Skull: Normal. Negative for fracture or focal lesion. Sinuses/Orbits: Mild air-fluid   levels are noted within the maxillary and sphenoid sinuses bilaterally. Other: None IMPRESSION: Somewhat limited by patient motion artifact. Mild atrophic changes. No acute intracranial abnormality is noted. Electronically Signed   By: Mark  Lukens M.D.   On: 03/04/2021 19:51   CT Angio Chest PE W and/or Wo Contrast  Result Date: 03/10/2021 CLINICAL DATA:  Status post CPR, evaluate for pulmonary embolism EXAM: CT ANGIOGRAPHY CHEST WITH CONTRAST TECHNIQUE: Multidetector CT imaging of the chest was performed using the standard protocol during bolus administration of intravenous contrast. Multiplanar CT image reconstructions and MIPs were obtained to evaluate the vascular anatomy. CONTRAST:  100mL OMNIPAQUE IOHEXOL 350 MG/ML SOLN COMPARISON:  Chest x-ray from earlier in the same day, CT from 01/06/2019. FINDINGS: Cardiovascular: Thoracic aorta demonstrates a normal  branching pattern. No aneurysmal dilatation or dissection is seen. Heart is enlarged in size. Pulmonary artery is well visualized with a normal branching pattern. Mild motion artifact somewhat limits the distal branches although no pulmonary emboli are identified. No significant coronary calcifications are noted. Left jugular dialysis catheter is noted in satisfactory position. Mediastinum/Nodes: Thoracic inlet is within normal limits. Endotracheal tube and gastric catheter are noted in satisfactory position. The esophagus as visualized is within normal limits. No sizable hilar or mediastinal adenopathy is noted. Lungs/Pleura: Lungs demonstrate diffuse multifocal consolidation worst in the bases bilaterally. Large left pleural effusion is noted. Parenchymal edema is identified right greater than left similar to that seen on recent plain film examination. No pneumothorax is noted. Upper Abdomen: Visualized upper abdomen demonstrates some nodularity of the liver suggestive of underlying cirrhosis. Remainder of the upper abdomen is within normal limits. Musculoskeletal: Motion artifact somewhat limits evaluation although there are a few anterior rib fractures identified on the right involving the second, third, fourth and fifth ribs. Similar angulated rib fractures are noted anteriorly on the left. Stable T10 compression deformity is noted unchanged from 2020. Review of the MIP images confirms the above findings. IMPRESSION: Bilateral consolidation primarily in the lower lobes with associated diffuse pulmonary edema right greater than left similar to that seen on prior plain film. Large left pleural effusion. No evidence of pulmonary emboli. Changes consistent with cirrhosis of the liver. Tubes and lines in satisfactory position. Multiple rib fractures related to the known CPR. Electronically Signed   By: Mark  Lukens M.D.   On: 03/16/2021 19:49   DG CHEST PORT 1 VIEW  Result Date: 03/27/2021 CLINICAL DATA:  Status  post intubation EXAM: PORTABLE CHEST 1 VIEW COMPARISON:  Film from earlier in the same day. FINDINGS: Endotracheal tube is again identified in satisfactory position. Gastric catheter extends into the stomach. Dialysis catheter is again seen and stable. Persistent bilateral airspace opacities are noted right considerably greater than left similar to that seen on prior exam as well as the recent chest CT. The known rib fractures are not well appreciated on this exam. IMPRESSION: Relatively stable appearance of the chest when compared with the prior study. Electronically Signed   By: Mark  Lukens M.D.   On: 03/06/2021 22:54   DG Chest Port 1 View  Result Date: 03/12/2021 CLINICAL DATA:  Status post cardiac arrest and CPR EXAM: PORTABLE CHEST 1 VIEW COMPARISON:  06/21/2020 FINDINGS: Endotracheal tube is noted in satisfactory position. Gastric catheter is noted coiled within the stomach. Left jugular dialysis catheter is noted in satisfactory position. Cardiac shadow is enlarged. Small left pleural effusion is noted improved from the prior exam. Significant opacity is noted within both lungs right greater than left   likely related to pulmonary edema given recent CPR. No pneumothorax is noted. No discrete rib fractures are seen. IMPRESSION: Tubes and lines as described in satisfactory position. Diffuse airspace disease throughout the right lung and to a lesser degree in the left lung likely related to edema given the recent CPR. Electronically Signed   By: Inez Catalina M.D.   On: 02/27/2021 17:59    acetaminophen (TYLENOL) oral liquid 160 mg/5 mL  650 mg Per Tube Q6H   chlorhexidine gluconate (MEDLINE KIT)  15 mL Mouth Rinse BID   Chlorhexidine Gluconate Cloth  6 each Topical Daily   docusate  100 mg Per Tube BID   fentaNYL (SUBLIMAZE) injection  100 mcg Intravenous Once   heparin injection (subcutaneous)  5,000 Units Subcutaneous Q8H   mouth rinse  15 mL Mouth Rinse 10 times per day   pantoprazole (PROTONIX)  IV  40 mg Intravenous Daily   polyethylene glycol  17 g Per Tube Daily    BMET    Component Value Date/Time   NA 139 03/08/2021 0620   K 3.0 (L) 03/08/2021 0620   CL 96 (L) 03/08/2021 0338   CO2 14 (L) 03/08/2021 0338   GLUCOSE 75 03/08/2021 0338   BUN 25 (H) 03/08/2021 0338   CREATININE 3.09 (H) 03/08/2021 0338   CALCIUM 6.9 (L) 03/08/2021 0338   GFRNONAA 17 (L) 03/08/2021 0338   CBC    Component Value Date/Time   WBC 6.2 03/08/2021 0338   RBC 3.23 (L) 03/08/2021 0338   HGB 12.6 03/08/2021 0620   HCT 37.0 03/08/2021 0620   PLT 100 (L) 03/08/2021 0338   MCV 100.6 (H) 03/08/2021 0338   MCH 37.2 (H) 03/08/2021 0338   MCHC 36.9 (H) 03/08/2021 0338   RDW 13.7 03/08/2021 0338   LYMPHSABS 2.8 02/26/2021 1754   MONOABS 0.3 03/25/2021 1754   EOSABS 0.2 03/17/2021 1754   BASOSABS 0.1 03/14/2021 1754    Dialysis Orders: Center: [facility]  on Mondays and Fridays. EDW 75.5 HD Bath 2K/2.5Ca  Time 210 minutes Heparin 1000 units bolus then 500 units/hr. Access L IJ TDC BFR 400 DFR 500      Assessment/Plan:  Cardiac arrest - PEA arrest requiring 30 minutes of CPR.  Presumably due to hypoxia.  Trend troponins per PCCM and keep MAPS >65 per PCCM.  ECHO later today. Acute hypoxic respiratory failure - requiring 100% FiO2 and 8 of PEEP.  CXR with bilateral infiltrates.  Given difficulty with oxygenation will plan to initiate CRRT with UF to help decrease oxygen requirements. Not able to significantly UF with CRRT due to hypotension. FiO2 requirement has improved from 100% to 60% Continue with attempts of UF  ESRD -  started on CRRT 02/28/2021 with UF 50-150 ml/hr, CRRT orders:  4K/2.5Ca bath for dialysate 1000 ml/hr, pre-filter 300 ml/hr, and post-filter replacement fluids 300 ml/hr Anticoagulation heparin, however platelets dropping so will stop heparin and follow.  Hypertension/volume  - Evidence of pulmonary edema on CXR and elevated BNP.  UF with CRRT as tolerated.  Now requiring  pressors with UF.  Anemia  - Hgb stable, no epo on outpatient orders  Metabolic bone disease -  hold meds for now  Nutrition - npo for now. Hypokalemia - persistent will replete with IV runs Kcl x 2 Hypomagnesemia - will replete with iv mag and follow.   Donetta Potts, MD [employer] [phone]

## 2021-03-08 NOTE — Progress Notes (Signed)
[facility] Progress Note Patient Name: Jennipher Weatherholtz DOB: 11/01/1965 MRN: [id number]   Date of Service  03/08/2021  HPI/Events of Note  CBG 79, ABG reviewed, PO2 51 %  eICU Interventions  D 10 % gtt started at 10 ml / hour, FIO2 increased to 100 %, PEEP increased to 10, ABG in one hour.        [name] Malakai Schoenherr 03/08/2021, 4:52 AM

## 2021-03-08 NOTE — Progress Notes (Signed)
PCCM:  Patient with visible seizure like activity.  PRN Ativan given.  Loaded with Keppra  EEG tech called  Garner Nash, DO [facility] 03/08/2021 6:20 PM

## 2021-03-08 NOTE — Progress Notes (Addendum)
[facility] Progress Note Patient Name: Summer Cook DOB: 05/05/1966 MRN: [id number]   Date of Service  03/08/2021  HPI/Events of Note  Blood pressure is soft, 89/63, Heart rate 54.  eICU Interventions  Peripheral Norepinephrine ordered PRN.        [name] 03/08/2021, 5:28 AM

## 2021-03-08 NOTE — Consult Note (Signed)
NEUROLOGY CONSULTATION NOTE   Date of service: March 08, 2021 Patient Name: Summer Cook MRN:  [id number] DOB:  06-28-1966 Reason for consult: "Cardiac arrest with post anoxic injury and concern for seizures" Requesting Provider: Garner Nash, DO _ _ _   _ __   _ __ _ _  __ __   _ __   __ _  History of Present Illness  Summer Cook is a 55 y.o. female with PMH significant for ESRD on dialysis, CAD, HTN, GERD, ITP, PE, strokes who had a cardiac arrest during dialysis. PEA arrest. CPR for 30 mins.  Today, she had witnessed seizure like activity. She was given Ativan and loaded withe Keppra with resolution. Neurology consutled for further evaluation.  No clinical seizures on my evaluation.   ROS   Unable to obtain ROS, PMH, PSH, Fhx, Shx due tin intubation and sedation.  Past History   Unable to obtain 2/2 intubation and sedation.  No family history on file. Social History   Socioeconomic History   Marital status: Divorced    Spouse name: Not on file   Number of children: Not on file   Years of education: Not on file   Highest education level: Not on file  Occupational History   Not on file  Tobacco Use   Smoking status: Not on file   Smokeless tobacco: Not on file  Substance and Sexual Activity   Alcohol use: Not on file   Drug use: Not on file   Sexual activity: Not on file  Other Topics Concern   Not on file  Social History Narrative   Not on file   Social Determinants of Health   Financial Resource Strain: Not on file  Food Insecurity: Not on file  Transportation Needs: Not on file  Physical Activity: Not on file  Stress: Not on file  Social Connections: Not on file   Allergies  Allergen Reactions   Bee Venom Swelling   Ciprofloxacin Nausea And Vomiting   Cymbalta [Duloxetine Hcl] Itching   Dilaudid [Hydromorphone]    Penicillins Nausea And Vomiting   Vortioxetine Itching   Ceftriaxone Rash    Medications   Medications Prior to Admission   Medication Sig Dispense Refill Last Dose   calcium acetate (PHOSLO) 667 MG capsule Take 667-1,334 mg by mouth 3 (three) times daily with meals.   03/08/2021   FLUoxetine (PROZAC) 20 MG capsule Take 40 mg by mouth daily.   03/06/2021   furosemide (LASIX) 80 MG tablet Take 80 mg by mouth daily.   03/26/2021   hydrALAZINE (APRESOLINE) 25 MG tablet Take 25 mg by mouth 3 (three) times daily.   03/21/2021   nitroGLYCERIN (NITROSTAT) 0.4 MG SL tablet Place 0.4 mg under the tongue every 5 (five) minutes as needed for chest pain.   Past Month   Oxycodone HCl 10 MG TABS Take 10 mg by mouth every 4 (four) hours as needed.   03/06/2021   zolpidem (AMBIEN) 5 MG tablet Take 5 mg by mouth at bedtime.   Past Month     Vitals   Vitals:   03/08/21 1845 03/08/21 1900 03/08/21 1915 03/08/21 1916  BP: (!) 86/61 91/62 99/72  91/62  Pulse: 66 66 69 69  Resp: (!) 35 (!) 35 (!) 31 (!) 35  Temp:    (!) 97.5 F (36.4 C)  TempSrc:    Oral  SpO2: 100% 100% 100% 100%  Weight:      Height:  Body mass index is 28.96 kg/m.  Physical Exam   General: Laying comfortably in bed; in no acute distress. HENT: Normal oropharynx and mucosa. Normal external appearance of ears and nose. Neck: Supple, no pain or tenderness CV: No JVD. No peripheral edema. Pulmonary: Symmetric Chest rise. Triggers vent. Abdomen: Soft to touch, non-tender. Ext: No cyanosis, edema, or deformity Skin: No rash. Normal palpation of skin.  Musculoskeletal: Normal digits and nails by inspection. No clubbing.  Neurologic Examination on fentanyl   Mental status/Cognition: no response to voice or loud clap. No grimace to pain.  Brainstem Corneal: + BL Pupils: 59mm BL, round and reactive to light Cough: + Gag: absent  Motor/Sensory: Muscle bulk: poor, tone increased extensor tone in all extremities.  Upgoing toe noted to any stimulation in her BL feet. No response to pain in BL upper extremities.  Reflexes:  Right Left Comments   Pectoralis      Biceps (C5/6) 2 2   Brachioradialis (C5/6) 2 2    Triceps (C6/7) 2 2    Patellar (L3/4) 3 3    Achilles (S1) 2 2    Hoffman      Plantar up up   Jaw jerk    Coordination/Complex Motor:  Unable to assess.  Labs   CBC:  Recent Labs  Lab 03/22/2021 1754 02/26/2021 2230 03/08/21 0338 03/08/21 0405 03/08/21 0620  WBC 13.4* 6.9 6.2  --   --   NEUTROABS 9.9*  --   --   --   --   HGB 11.2* 11.0* 12.0 13.3 12.6  HCT 35.9* 33.6* 32.5* 39.0 37.0  MCV 103.8* 96.8 100.6*  --   --   PLT 110* 126* 100*  --   --     Basic Metabolic Panel:  Lab Results  Component Value Date   NA 136 03/08/2021   K 4.3 03/08/2021   CO2 21 (L) 03/08/2021   GLUCOSE 109 (H) 03/08/2021   BUN 21 (H) 03/08/2021   CREATININE 2.74 (H) 03/08/2021   CALCIUM 8.0 (L) 03/08/2021   GFRNONAA 20 (L) 03/08/2021   Lipid Panel: No results found for: LDLCALC HgbA1c: No results found for: HGBA1C Urine Drug Screen: No results found for: LABOPIA, COCAINSCRNUR, LABBENZ, AMPHETMU, THCU, LABBARB  Alcohol Level No results found for: [facility]  CT Head without contrast: Somewhat limited by patient motion artifact. Mild atrophic changes. No acute intracranial abnormality is noted.  MRI Brain: pending  rEEG:  pending  Impression   Summer Cook is a 55 y.o. female with PMH significant for ESRD on dialysis, CAD, HTN, GERD, ITP, PE, strokes who had a cardiac arrest during dialysis. PEA arrest. CPR for 30 mins.. Her neurologic examination is notable for patchy brainstem reflexes but she is also on fentanyl gtt.  The RN did mention that she was earlier noted to withdraw to pain with fentanyl was held.  She was hooked up to [place] cEEG at bedside with some discharges noted but no seizures.  Recommendations  - Continue supportive Care - Hold off on further AEDs at this time - cEEG in AM when techs arrive, will keep her on Ceribell overnight - MRI Brain after cEEG and when able - Avoid hyponatremia,  hyperthermia and hypotension. - Neurology will continue to follow along. ______________________________________________________________________   Thank you for the opportunity to take part in the care of this patient. If you have any further questions, please contact the neurology consultation attending.  Signed,  [place] Pager Number [phone] _  _ _   _ __   _ __ _ _  __ __   _ __   __ _  

## 2021-03-08 NOTE — Procedures (Signed)
Arterial Catheter Insertion Procedure Note  Summer Cook  [id number]  [birth year]  Date:03/08/21  Time:6:35 PM    Provider Performing: Johnsie Cancel    Procedure: Insertion of Arterial Line [phone]) with US guidance (10932)   Indication(s) Blood pressure monitoring and/or need for frequent ABGs  Consent Unable to obtain consent due to emergent nature of procedure.  Anesthesia None   Time Out Verified patient identification, verified procedure, site/side was marked, verified correct patient position, special equipment/implants available, medications/allergies/relevant history reviewed, required imaging and test results available.   Sterile Technique Maximal sterile technique including full sterile barrier drape, hand hygiene, sterile gown, sterile gloves, mask, hair covering, sterile ultrasound probe cover (if used).   Procedure Description Area of catheter insertion was cleaned with chlorhexidine and draped in sterile fashion. With real-time ultrasound guidance an arterial catheter was placed into the right radial artery.  Appropriate arterial tracings confirmed on monitor.     Complications/Tolerance None; patient tolerated the procedure well.   EBL Minimal   Specimen(s) None  Johnsie Cancel, NP-C  Pulmonary & Critical Care Personal contact information can be found on Amion  03/08/2021, 6:35 PM

## 2021-03-08 NOTE — Progress Notes (Signed)
Ok to hold off on CT until later today per Dr. Orpah Melter.

## 2021-03-08 NOTE — Progress Notes (Signed)
Hypoglycemic Event  CBG: 64  Treatment: D50 50 mL (25 gm)  Symptoms: None  Follow-up CBG: Time:1621 CBG Result:107  Possible Reasons for Event: Unknown  Comments/MD notified:hypoglycemic protocol followed    Summer Cook

## 2021-03-08 NOTE — Procedures (Addendum)
Central Venous Catheter Insertion Procedure Note  Summer Cook  [id number]  1965-11-06  Date:03/08/21  Time:8:34 PM   Provider Performing:Grace E Bowser   Procedure: Insertion of Non-tunneled Central Venous [phone]) with US guidance ([id number])   Indication(s) Medication administration and Difficult access  Consent Risks of the procedure as well as the alternatives and risks of each were explained to the patient and/or caregiver.  Consent for the procedure was obtained and is signed in the bedside chart  Anesthesia Systemic sedation as per PAD protocol, as well as 1% lidocaine  Timeout Verified patient identification, verified procedure, site/side was marked, verified correct patient position, special equipment/implants available, medications/allergies/relevant history reviewed, required imaging and test results available.  Sterile Technique Maximal sterile technique including full sterile barrier drape, hand hygiene, sterile gown, sterile gloves, mask, hair covering, sterile ultrasound probe cover (if used).  Procedure Description Venous sites evaluated prior to sterile preparation. Patient with existing R sided HD catheter. While evaluating LIJ, patient desaturated while in flat position. Head of bead raised and femoral veins evaluated, R femoral vein selected as procedure site. Area of catheter insertion was cleaned with chlorhexidine and draped in sterile fashion.  With real-time ultrasound guidance a central venous catheter was placed into the right femoral vein.  Guidewire placement verified with ultrasound prior to vessel dilation and catheter placement. Non-pulsatile blood flow and easy flushing noted in all ports. The catheter was sutured in place and sterile dressing applied.  Complications/Tolerance None; patient tolerated the procedure well. Chest x-ray is not ordered for femoral cannulation.  EBL Minimal  Specimen(s) None  Eliseo Gum MSN, AGACNP-BC [place] for pager 03/08/2021, 8:52 PM

## 2021-03-08 NOTE — Progress Notes (Signed)
CCM cross cover note  55 yo F post arrest x 40min. Over the course of 6/11 shift, has had increasing pressor requirement via peripheral access. Would benefit from central access. Discussed with daughter, Danae Chen, would wishes to proceed with central line placement.   Eliseo Gum MSN, AGACNP-BC [place] for pager  [phone] ELink 03/08/2021, 7:34 PM

## 2021-03-09 ENCOUNTER — Inpatient Hospital Stay (HOSPITAL_COMMUNITY): Payer: Medicare Other

## 2021-03-09 DIAGNOSIS — R9401 Abnormal electroencephalogram [EEG]: Secondary | ICD-10-CM

## 2021-03-09 DIAGNOSIS — R569 Unspecified convulsions: Secondary | ICD-10-CM | POA: Diagnosis not present

## 2021-03-09 DIAGNOSIS — I469 Cardiac arrest, cause unspecified: Secondary | ICD-10-CM

## 2021-03-09 DIAGNOSIS — Z992 Dependence on renal dialysis: Secondary | ICD-10-CM | POA: Diagnosis not present

## 2021-03-09 DIAGNOSIS — N186 End stage renal disease: Secondary | ICD-10-CM | POA: Diagnosis not present

## 2021-03-09 LAB — RENAL FUNCTION PANEL
Albumin: 2.6 g/dL — ABNORMAL LOW (ref 3.5–5.0)
Albumin: 2.4 g/dL — ABNORMAL LOW (ref 3.5–5.0)
Anion gap: 12 (ref 5–15)
Anion gap: 9 (ref 5–15)
BUN: 18 mg/dL (ref 6–20)
BUN: 20 mg/dL (ref 6–20)
CO2: 23 mmol/L (ref 22–32)
CO2: 24 mmol/L (ref 22–32)
Calcium: 8.6 mg/dL — ABNORMAL LOW (ref 8.9–10.3)
Calcium: 8.7 mg/dL — ABNORMAL LOW (ref 8.9–10.3)
Chloride: 100 mmol/L (ref 98–111)
Chloride: 102 mmol/L (ref 98–111)
Creatinine, Ser: 1.92 mg/dL — ABNORMAL HIGH (ref 0.44–1.00)
Creatinine, Ser: 1.56 mg/dL — ABNORMAL HIGH (ref 0.44–1.00)
GFR, Estimated: 30 mL/min — ABNORMAL LOW (ref >60)
GFR, Estimated: 39 mL/min — ABNORMAL LOW (ref >60)
Glucose, Bld: 128 mg/dL — ABNORMAL HIGH (ref 70–99)
Glucose, Bld: 115 mg/dL — ABNORMAL HIGH (ref 70–99)
Phosphorus: 2.5 mg/dL (ref 2.5–4.6)
Phosphorus: 2.1 mg/dL — ABNORMAL LOW (ref 2.5–4.6)
Potassium: 3.4 mmol/L — ABNORMAL LOW (ref 3.5–5.1)
Potassium: 3.4 mmol/L — ABNORMAL LOW (ref 3.5–5.1)
Sodium: 135 mmol/L (ref 135–145)
Sodium: 135 mmol/L (ref 135–145)

## 2021-03-09 LAB — GLUCOSE, CAPILLARY
Glucose-Capillary: 96 mg/dL (ref 70–99)
Glucose-Capillary: 78 mg/dL (ref 70–99)
Glucose-Capillary: 106 mg/dL — ABNORMAL HIGH (ref 70–99)
Glucose-Capillary: 96 mg/dL (ref 70–99)
Glucose-Capillary: 105 mg/dL — ABNORMAL HIGH (ref 70–99)
Glucose-Capillary: 123 mg/dL — ABNORMAL HIGH (ref 70–99)

## 2021-03-09 LAB — CBC
HCT: 36.5 % (ref 36.0–46.0)
Hemoglobin: 12.3 g/dL (ref 12.0–15.0)
MCH: 32.5 pg (ref 26.0–34.0)
MCHC: 33.7 g/dL (ref 30.0–36.0)
MCV: 96.6 fL (ref 80.0–100.0)
Platelets: 98 10*3/uL — ABNORMAL LOW (ref 150–400)
RBC: 3.78 MIL/uL — ABNORMAL LOW (ref 3.87–5.11)
RDW: 14.7 % (ref 11.5–15.5)
WBC: 18.4 10*3/uL — ABNORMAL HIGH (ref 4.0–10.5)
nRBC: 0.1 % (ref 0.0–0.2)

## 2021-03-09 LAB — ECHOCARDIOGRAM COMPLETE
AR max vel: 1.58 cm2
AV Area VTI: 1.52 cm2
AV Area mean vel: 1.39 cm2
AV Mean grad: 13 mmHg
AV Peak grad: 21.7 mmHg
Ao pk vel: 2.33 m/s
Area-P 1/2: 2.49 cm2
Calc EF: 53.4 %
Height: 66 in
S' Lateral: 4.5 cm
Single Plane A2C EF: 56.2 %
Single Plane A4C EF: 51.2 %
Weight: 2821.89 oz

## 2021-03-09 LAB — POCT I-STAT 7, (LYTES, BLD GAS, ICA,H+H)
Acid-Base Excess: 2 mmol/L (ref 0.0–2.0)
Bicarbonate: 26.4 mmol/L (ref 20.0–28.0)
Calcium, Ion: 1.03 mmol/L — ABNORMAL LOW (ref 1.15–1.40)
HCT: 35 % — ABNORMAL LOW (ref 36.0–46.0)
Hemoglobin: 11.9 g/dL — ABNORMAL LOW (ref 12.0–15.0)
O2 Saturation: 99 %
Patient temperature: 97.5
Potassium: 3.4 mmol/L — ABNORMAL LOW (ref 3.5–5.1)
Sodium: 137 mmol/L (ref 135–145)
TCO2: 27 mmol/L (ref 22–32)
pCO2 arterial: 36.6 mmHg (ref 32.0–48.0)
pH, Arterial: 7.463 — ABNORMAL HIGH (ref 7.350–7.450)
pO2, Arterial: 105 mmHg (ref 83.0–108.0)

## 2021-03-09 LAB — MAGNESIUM
Magnesium: 2.6 mg/dL — ABNORMAL HIGH (ref 1.7–2.4)
Magnesium: 2.6 mg/dL — ABNORMAL HIGH (ref 1.7–2.4)

## 2021-03-09 LAB — PHOSPHORUS: Phosphorus: 2.1 mg/dL — ABNORMAL LOW (ref 2.5–4.6)

## 2021-03-09 LAB — APTT: aPTT: 34 seconds (ref 24–36)

## 2021-03-09 MED ORDER — SODIUM CHLORIDE 0.9 % IV SOLN
400.0000 [IU]/h | INTRAVENOUS | Status: DC
  Administered 2021-03-09 – 2021-03-12 (×4): 400 [IU]/h via INTRAVENOUS_CENTRAL
  Filled 2021-03-09 (×2): qty 2
  Filled 2021-03-09 (×2): qty 10000
  Filled 2021-03-09: qty 2

## 2021-03-09 MED ORDER — CHLORHEXIDINE GLUCONATE CLOTH 2 % EX PADS
6.0000 | MEDICATED_PAD | Freq: Every day | CUTANEOUS | Status: DC
  Administered 2021-03-11 – 2021-03-13 (×3): 6 via TOPICAL

## 2021-03-09 MED ORDER — SODIUM CHLORIDE 0.9% FLUSH
10.0000 mL | Freq: Two times a day (BID) | INTRAVENOUS | Status: DC
  Administered 2021-03-09: 20 mL
  Administered 2021-03-10 – 2021-03-12 (×3): 10 mL
  Administered 2021-03-13: 20 mL

## 2021-03-09 MED ORDER — RENA-VITE PO TABS
1.0000 | ORAL_TABLET | Freq: Every day | ORAL | Status: DC
  Administered 2021-03-09 – 2021-03-12 (×4): 1
  Filled 2021-03-09 (×4): qty 1

## 2021-03-09 MED ORDER — CALCIUM GLUCONATE-NACL 1-0.675 GM/50ML-% IV SOLN
1.0000 g | Freq: Once | INTRAVENOUS | Status: AC
  Administered 2021-03-09: 1000 mg via INTRAVENOUS
  Filled 2021-03-09: qty 50

## 2021-03-09 MED ORDER — HEPARIN BOLUS VIA INFUSION (CRRT)
1000.0000 [IU] | INTRAVENOUS | Status: DC | PRN
  Filled 2021-03-09: qty 1000

## 2021-03-09 MED ORDER — MIDAZOLAM 50MG/50ML (1MG/ML) PREMIX INFUSION
2.0000 mg/h | INTRAVENOUS | Status: DC
  Administered 2021-03-09: 0.5 mg/h via INTRAVENOUS
  Administered 2021-03-10 – 2021-03-12 (×4): 2 mg/h via INTRAVENOUS
  Filled 2021-03-09 (×7): qty 50

## 2021-03-09 MED ORDER — SODIUM CHLORIDE 0.9% FLUSH: 10.0000 mL | INTRAVENOUS | Status: DC | PRN

## 2021-03-09 MED ORDER — PIVOT 1.5 CAL PO LIQD
1000.0000 mL | ORAL | Status: DC
  Administered 2021-03-09 – 2021-03-12 (×6): 1000 mL
  Filled 2021-03-09 (×12): qty 1000

## 2021-03-09 NOTE — Progress Notes (Signed)
Patient ID: Summer Cook, female   DOB: 07/07/1966, 55 y.o.   MRN: [id number] S: Pt seen and examined while on CRRT.  Labs and orders reviewed for alterations if needed.  Tolerated 1.3 liters UF overnight.  Able to wean levophed and FiO2 down to 50%. O:BP (!) 87/66   Pulse 70   Temp 97.9 F (36.6 C) (Axillary)   Resp (!) 31   Ht _0  (1.676 m)   Wt 80 kg   SpO2 100%   BMI 28.47 kg/m   Intake/Output Summary (Last 24 hours) at 03/09/2021 1017 Last data filed at 03/09/2021 1000 Gross per 24 hour  Intake 3259.44 ml  Output 4610 ml  Net -1350.56 ml   Intake/Output: I/O last 3 completed shifts: In: 3627.6 [I.V.:1359.6; NG/GT:929.3; IV Piggyback:1338.7] Out: 5375 [Emesis/NG output:350; Other:5025]  Intake/Output this shift:  Total I/O In: 599.2 [I.V.:109.2; Other:20; NG/GT:270; IV Piggyback:200] Out: 833 [Other:833] Weight change: 2.889 kg SKA:[id number] and sedated CVS: RRR Resp: ventilated BS bilaterally Abd:+BS, soft, NT/ND Ext: 1+ pretibial edema  Recent Labs  Lab 03/08/2021 1754 02/28/2021 2230 03/08/21 0338 03/08/21 0405 03/08/21 0620 03/08/21 1318 03/08/21 1636 03/09/21 0204 03/09/21 0336  NA 138 137 127* 139 139  --  136 135 137  K 2.5* 2.4* 2.7* 3.1* 3.0*  --  4.3 3.4* 3.4*  CL 102 103 96*  --   --   --  99 100  --   CO2 24 20* 14*  --   --   --  21* 23  --   GLUCOSE 131* 104* 75  --   --   --  109* 128*  --   BUN 31* 31* 25*  --   --   --  21* 18  --   CREATININE 3.62* 3.92* 3.09*  --   --   --  2.74* 1.92*  --   ALBUMIN 2.7* 2.4* 2.3*  --   --   --  2.5* 2.6*  --   CALCIUM 8.2* 8.1* 6.9*  --   --   --  8.0* 8.6*  --   PHOS  --  4.1 3.2  --   --  3.2 3.1  3.1 2.5  --   AST 54* 74* 75*  --   --   --   --   --   --   ALT _1 --   --   --   --   --   --    Liver Function Tests: Recent Labs  Lab 03/15/2021 1754 03/16/2021 2230 03/08/21 0338 03/08/21 1636 03/09/21 0204  AST 54* 74* 75*  --   --   ALT _2 --   --   ALKPHOS 99 82 70  --   --    BILITOT 1.0 1.5* 1.5*  --   --   PROT 5.8* 5.1* 5.0*  --   --   ALBUMIN 2.7* 2.4* 2.3* 2.5* 2.6*   No results for input(s): LIPASE, AMYLASE in the last 168 hours. No results for input(s): AMMONIA in the last 168 hours. CBC: Recent Labs  Lab 02/26/2021 1754 03/06/2021 2230 03/08/21 0338 03/08/21 0405 03/08/21 0620 03/09/21 0204 03/09/21 0336  WBC 13.4* 6.9 6.2  --   --  18.4*  --   NEUTROABS 9.9*  --   --   --   --   --   --   HGB 11.2* 11.0* 12.0   < > 12.6 12.3 11.9*  HCT  35.9* 33.6* 32.5*   < > 37.0 36.5 35.0*  MCV 103.8* 96.8 100.6*  --   --  96.6  --   PLT 110* 126* 100*  --   --  98*  --    < > = values in this interval not displayed.   Cardiac Enzymes: No results for input(s): CKTOTAL, CKMB, CKMBINDEX, TROPONINI in the last 168 hours. CBG: Recent Labs  Lab 03/08/21 1621 03/08/21 1926 03/08/21 2306 03/09/21 0322 03/09/21 0714  GLUCAP 107* 104* 106* 96 78    Iron Studies: No results for input(s): IRON, TIBC, TRANSFERRIN, FERRITIN in the last 72 hours. Studies/Results: CT Head Wo Contrast  Result Date: 03/18/2021 CLINICAL DATA:  Recent CPR and unresponsive state, initial encounter EXAM: CT HEAD WITHOUT CONTRAST TECHNIQUE: Contiguous axial images were obtained from the base of the skull through the vertex without intravenous contrast. COMPARISON:  None. FINDINGS: Brain: No evidence of acute infarction, hemorrhage, hydrocephalus, extra-axial collection or mass lesion/mass effect. Mild atrophic changes are noted. Vascular: No hyperdense vessel or unexpected calcification. Skull: Normal. Negative for fracture or focal lesion. Sinuses/Orbits: Mild air-fluid levels are noted within the maxillary and sphenoid sinuses bilaterally. Other: None IMPRESSION: Somewhat limited by patient motion artifact. Mild atrophic changes. No acute intracranial abnormality is noted. Electronically Signed   By: Inez Catalina M.D.   On: 03/21/2021 19:51   CT Angio Chest PE W and/or Wo  Contrast  Result Date: 03/11/2021 CLINICAL DATA:  Status post CPR, evaluate for pulmonary embolism EXAM: CT ANGIOGRAPHY CHEST WITH CONTRAST TECHNIQUE: Multidetector CT imaging of the chest was performed using the standard protocol during bolus administration of intravenous contrast. Multiplanar CT image reconstructions and MIPs were obtained to evaluate the vascular anatomy. CONTRAST:  128m OMNIPAQUE IOHEXOL 350 MG/ML SOLN COMPARISON:  Chest x-ray from earlier in the same day, CT from 01/06/2019. FINDINGS: Cardiovascular: Thoracic aorta demonstrates a normal branching pattern. No aneurysmal dilatation or dissection is seen. Heart is enlarged in size. Pulmonary artery is well visualized with a normal branching pattern. Mild motion artifact somewhat limits the distal branches although no pulmonary emboli are identified. No significant coronary calcifications are noted. Left jugular dialysis catheter is noted in satisfactory position. Mediastinum/Nodes: Thoracic inlet is within normal limits. Endotracheal tube and gastric catheter are noted in satisfactory position. The esophagus as visualized is within normal limits. No sizable hilar or mediastinal adenopathy is noted. Lungs/Pleura: Lungs demonstrate diffuse multifocal consolidation worst in the bases bilaterally. Large left pleural effusion is noted. Parenchymal edema is identified right greater than left similar to that seen on recent plain film examination. No pneumothorax is noted. Upper Abdomen: Visualized upper abdomen demonstrates some nodularity of the liver suggestive of underlying cirrhosis. Remainder of the upper abdomen is within normal limits. Musculoskeletal: Motion artifact somewhat limits evaluation although there are a few anterior rib fractures identified on the right involving the second, third, fourth and fifth ribs. Similar angulated rib fractures are noted anteriorly on the left. Stable T10 compression deformity is noted unchanged from 2020.  Review of the MIP images confirms the above findings. IMPRESSION: Bilateral consolidation primarily in the lower lobes with associated diffuse pulmonary edema right greater than left similar to that seen on prior plain film. Large left pleural effusion. No evidence of pulmonary emboli. Changes consistent with cirrhosis of the liver. Tubes and lines in satisfactory position. Multiple rib fractures related to the known CPR. Electronically Signed   By: MInez CatalinaM.D.   On: 03/11/2021 19:49   DG CHEST  PORT 1 VIEW  Result Date: 03/25/2021 CLINICAL DATA:  Status post intubation EXAM: PORTABLE CHEST 1 VIEW COMPARISON:  Film from earlier in the same day. FINDINGS: Endotracheal tube is again identified in satisfactory position. Gastric catheter extends into the stomach. Dialysis catheter is again seen and stable. Persistent bilateral airspace opacities are noted right considerably greater than left similar to that seen on prior exam as well as the recent chest CT. The known rib fractures are not well appreciated on this exam. IMPRESSION: Relatively stable appearance of the chest when compared with the prior study. Electronically Signed   By: Inez Catalina M.D.   On: 03/03/2021 22:54   DG Chest Port 1 View  Result Date: 03/23/2021 CLINICAL DATA:  Status post cardiac arrest and CPR EXAM: PORTABLE CHEST 1 VIEW COMPARISON:  06/21/2020 FINDINGS: Endotracheal tube is noted in satisfactory position. Gastric catheter is noted coiled within the stomach. Left jugular dialysis catheter is noted in satisfactory position. Cardiac shadow is enlarged. Small left pleural effusion is noted improved from the prior exam. Significant opacity is noted within both lungs right greater than left likely related to pulmonary edema given recent CPR. No pneumothorax is noted. No discrete rib fractures are seen. IMPRESSION: Tubes and lines as described in satisfactory position. Diffuse airspace disease throughout the right lung and to a lesser  degree in the left lung likely related to edema given the recent CPR. Electronically Signed   By: Inez Catalina M.D.   On: 03/20/2021 17:59    acetaminophen (TYLENOL) oral liquid 160 mg/5 mL  650 mg Per Tube Q6H   chlorhexidine gluconate (MEDLINE KIT)  15 mL Mouth Rinse BID   Chlorhexidine Gluconate Cloth  6 each Topical Daily   docusate  100 mg Per Tube BID   feeding supplement (PROSource TF)  45 mL Per Tube BID   feeding supplement (VITAL HIGH PROTEIN)  1,000 mL Per Tube Q24H   fentaNYL (SUBLIMAZE) injection  100 mcg Intravenous Once   heparin injection (subcutaneous)  5,000 Units Subcutaneous Q8H   mouth rinse  15 mL Mouth Rinse 10 times per day   pantoprazole (PROTONIX) IV  40 mg Intravenous Daily   polyethylene glycol  17 g Per Tube Daily   sodium chloride flush  10-40 mL Intracatheter Q12H    BMET    Component Value Date/Time   NA 137 03/09/2021 0336   K 3.4 (L) 03/09/2021 0336   CL 100 03/09/2021 0204   CO2 23 03/09/2021 0204   GLUCOSE 128 (H) 03/09/2021 0204   BUN 18 03/09/2021 0204   CREATININE 1.92 (H) 03/09/2021 0204   CALCIUM 8.6 (L) 03/09/2021 0204   GFRNONAA 30 (L) 03/09/2021 0204   CBC    Component Value Date/Time   WBC 18.4 (H) 03/09/2021 0204   RBC 3.78 (L) 03/09/2021 0204   HGB 11.9 (L) 03/09/2021 0336   HCT 35.0 (L) 03/09/2021 0336   PLT 98 (L) 03/09/2021 0204   MCV 96.6 03/09/2021 0204   MCH 32.5 03/09/2021 0204   MCHC 33.7 03/09/2021 0204   RDW 14.7 03/09/2021 0204   LYMPHSABS 2.8 03/17/2021 1754   MONOABS 0.3 03/06/2021 1754   EOSABS 0.2 03/19/2021 1754   BASOSABS 0.1 03/16/2021 1754    Dialysis Orders: Center: [facility]  on Mondays and Fridays. EDW 75.5 HD Bath 2K/2.5Ca  Time 210 minutes Heparin 1000 units bolus then 500 units/hr. Access L IJ TDC BFR 400 DFR 500      Assessment/Plan:  Cardiac arrest -  PEA arrest requiring 30 minutes of CPR.  Presumably due to hypoxia.  Trend troponins per PCCM and keep MAPS >65 per PCCM.  ECHO later  today. Acute hypoxic respiratory failure - requiring 100% FiO2 and 8 of PEEP.  CXR with bilateral infiltrates.  Given difficulty with oxygenation will plan to initiate CRRT with UF to help decrease oxygen requirements. Not able to significantly UF with CRRT due to hypotension. FiO2 requirement has improved from 100% to 60% Continue with attempts of UF  ESRD -  started on CRRT 03/25/2021 with UF 50-150 ml/hr, CRRT orders:  4K/2.5Ca bath for dialysate 1000 ml/hr, pre-filter 300 ml/hr, and post-filter replacement fluids 300 ml/hr Anticoagulation heparin, however platelets dropping so will stop heparin and follow.  Hypertension/volume  - Evidence of pulmonary edema on CXR and elevated BNP.  UF with CRRT as tolerated.  Now requiring pressors with UF.  Anemia  - Hgb stable, no epo on outpatient orders  Metabolic bone disease -  hold meds for now  Nutrition - npo for now. Hypokalemia - given 2 runs of Kcl yesterday. Hypomagnesemia - repleted with iv mag and follow.   Donetta Potts, MD [employer] [phone]

## 2021-03-09 NOTE — Progress Notes (Signed)
Called for frequent filter clotting.  Plts are 90-100 k range. No bleeding issues. Will resume low-dose circuit IV heparin at 400 u/ hr.   Kelly Splinter, MD 03/09/2021, 4:34 PM

## 2021-03-09 NOTE — Progress Notes (Addendum)
NAME:  Summer Cook, MRN:  [id number], DOB:  Oct 01, 1965, LOS: 2 ADMISSION DATE:  03/20/2021, CONSULTATION DATE:  03/06/2021 REFERRING MD:  Dr. Almyra Free, CHIEF COMPLAINT:   Cardiac arrest  Brief Narrative:  This is a 55 yo who presents sp Cardiac arrest. Arrested during dialysis. Was unresponsive requiring CPR for 30 minutes. 1 of EPI given by EMS. EMS placed king airway as well transitioned to ETT without need for RSI.   Pertinent  Medical History  Allergy Asthma  CAD,  CHF,  ESRD (T/TH/S [facility]),  HTN,  GERD ITP PE  stroke  Significant Hospital Events: Including procedures, antibiotic start and stop dates in addition to other pertinent events   6/10 Patient transferred from [facility] after suffering cardiac arrest at outpatient HD session. 30 mins of ACLS prior to EMS arrival. [facility] airway exchanged for ETT in ED with no RSI meds  6/11 Issues with oxygenation overnight required initiation of low-dose Levophed due to hypotension on CRRT.  Mildly hypothermic and hypoglycemic 6/12 Seen with new onset tonic clonic seizures afternoon of 6/11 once propofol weaned off. EEG started and neuro consulted   Interim History / Subjective:  No acute events overnight  Remains on EEG and CRRT   Objective   Blood pressure 97/80, pulse 73, temperature 97.9 F (36.6 C), temperature source Axillary, resp. rate (!) 35, height 5\' 6"  (1.676 m), weight 80 kg, SpO2 99 %.    Vent Mode: PRVC FiO2 (%):  [40 %-60 %] 40 % Set Rate:  [35 bmp] 35 bmp Vt Set:  [470 mL] 470 mL PEEP:  [8 cmH20-10 cmH20] 8 cmH20 Plateau Pressure:  [22 cmH20-27 cmH20] 22 cmH20   Intake/Output Summary (Last 24 hours) at 03/09/2021 0715 Last data filed at 03/09/2021 0700 Gross per 24 hour  Intake 2810.6 ml  Output 4215 ml  Net -1404.4 ml    Filed Weights   03/09/2021 2205 03/08/21 0127 03/09/21 0148  Weight: 81.4 kg 81.4 kg 80 kg    Examination: General: Acute on chronically ill appearing elderly female on  mechanical ventilation, in no acute distress in NAD HEENT: ETT, MM pink/moist, PERRL,  Neuro: Sedated on vent flicker of movement in extremity to pain  CV: s1s2 regular rate and rhythm, no murmur, rubs, or gallops,  PULM:  Clear to ascultation no added breath sounds, no increased work of breathing on vent GI: soft, bowel sounds active in all 4 quadrants, non-tender, non-distended Extremities: warm/dry, no edema  Skin: no rashes or lesions  Labs/imaging that I havepersonally reviewed   CTA chest from 03/10/2021 Bilateral consolidation primarily in the lower lobes with associated diffuse pulmonary edema right greater than left similar to that seen on prior plain film. Large left pleural effusion. Cirrhosis. Multiple rib fractures  CT head  from 03/10/2021 Somewhat limited by patient motion artifact. Mild atrophic changes.  Resolved Hospital Problem list   NA  Assessment & Plan:  Cardiac arrest -Etiology likely hypoxia given bilaterally infiltrates. However daughter does not note any respiratory complaints. Only diarrhea the night before. Negative for PE at OSH. Ct head with no acute intracranial abnormality.  Circulatory shock -30 min before CPR initiated  Combined systolic / diastolic heart failure  - Pt was volume overloaded and will be treated with hemodialysis.  Appreciate assistance of nephrology team. -BNP 3,450 P: ECHO pending  Supportive care  Will plan to obtain MRI brain at 72 hrs Follow cultures  Continuous telemetry  Volume removal per HD Optimize electrolytes   Acute  hypoxic respiratory failure -patient on 100% FiO2 and 8 of PEEP on admission. Not having SOB at home per daughter. Had some diarrhea -P/F ratio 248 27% mortality  P: Continue ventilator support with lung protective strategies  Wean PEEP and FiO2 for sats greater than 90%. Head of bed elevated 30 degrees. Plateau pressures less than 30 cm H20.  Follow intermittent chest x-ray and ABG.   SAT/SBT as  tolerated, mentation preclude extubation  Ensure adequate pulmonary hygiene  Follow cultures  VAP bundle in place  PAD protocol Remains on broad spectrum antibiotics   Leukocytosis  -WBC increased from 6.2 to 18.4 but she remains afebrile. Possibly reactive in the setting of new onset seizures P: Trend CBC and fever curve  Continue broad spectrum antibiotics  No indication for repeat culture data currently   ESRD -TTHS-Has perm cath in place P: Nephrology following Continue CRRT Follow renal function  Monitor urine output Trend Bmet Avoid nephrotoxins Ensure adequate renal perfusion   CAD -with chronically occluded PDA managed medically Myoview 2020 without ischemia otherwise no significant obstructive disease by cardiac catheterization in 2018. P: Supportive care  Hypokalemia Hyponatremia P: Supplement as needed  Trend Bmet   Hypoglycemia P: Continue D10 drip   Best practice   Diet:  NPO Pain/Anxiety/Delirium protocol (if indicated): Yes (RASS goal -1) VAP protocol (if indicated): Yes DVT prophylaxis: Subcutaneous Heparin GI prophylaxis: H2B Glucose control:  SSI Yes Central venous access:  Yes, and it is still needed Arterial line:  N/A Foley:  Yes, and it is still needed Mobility:  bed rest  PT consulted: N/A Last date of multidisciplinary goals of care discussion [6/11. Spoke with daughter. She would like to keep full code for now.  Code Status:  full code.  Disposition: ICU    Critical care time:    Performed by: Johnsie Cancel  Total critical care time: 38 minutes  Critical care time was exclusive of separately billable procedures and treating other patients.  Critical care was necessary to treat or prevent imminent or life-threatening deterioration.  Critical care was time spent personally by me on the following activities: development of treatment plan with patient and/or surrogate as well as nursing, discussions with consultants, evaluation  of patient's response to treatment, examination of patient, obtaining history from patient or surrogate, ordering and performing treatments and interventions, ordering and review of laboratory studies, ordering and review of radiographic studies, pulse oximetry and re-evaluation of patient's condition.  Johnsie Cancel, NP-C [facility] Personal contact information can be found on Amion  03/09/2021, 7:15 AM    Pulmonary critical care attending:  This is a 55 year old female, multitude of medical problems at her baseline, end-stage renal disease on Tuesday Thursday Saturday dialysis, hypertension, reflux, ITP, PE, history stroke, coronary artery disease.  Patient presented to the office after a cardiac arrest at dialysis unit for approximately 30 minutes of CPR before ROSC.  Patient was intubated in the field without RSI.  BP (!) 87/66   Pulse 72   Temp 97.9 F (36.6 C) (Axillary)   Resp (!) 35   Ht 5\' 6"  (1.676 m)   Wt 80 kg   SpO2 99%   BMI 28.47 kg/m   General: Elderly female intubated on mechanical life support appears older than stated age 15: Endotracheal tube in place, pupils are reactive, history of cataract surgery, Endotracheal tube in place Heart: Regular rhythm, S1-S2 Lungs: Bilateral mechanically ventilated breath sounds Extremities: No significant edema Neuro: No visible  seizure activity, she will withdrawal to pain, she is breathing over the ventilator.  Labs: Reviewed Electrolytes stable  Assessment: Cardiac arrest Acute on chronic systolic diastolic heart failure Shock multifactorial, postarrest Acute hypoxemic respiratory failure requiring intubation mechanical ventilation End-stage renal disease on dialysis baseline, now on CVVHD Coronary artery disease Hypokalemia, replete Hypoglycemia, resolved Leukocytosis, likely reactive postarrest, at risk for aspiration Seizure-like activity yesterday evening, loaded with Keppra, as needed  Ativan  Plan: Neurology consultation MRI brain pending EEG running currently Appreciate neurology input. Overall prognosis of suspect is poor. Continue Keppra, as needed Ativan Remains on norepinephrine to maintain mean arterial pressure greater than 65 SAT SAT when able, mentation precludes at this time. Continue tube feeds  Discussed with patient's daughter again at bedside today regarding current status.  I think we need to give her some time.  A MRI would be useful.  Plan to get MRI after neurology is done with EEG monitoring.  This patient is critically ill with multiple organ system failure; which, requires frequent high complexity decision making, assessment, support, evaluation, and titration of therapies. This was completed through the application of advanced monitoring technologies and extensive interpretation of multiple databases. During this encounter critical care time was devoted to patient care services described in this note for 40 minutes.  Garner Nash, DO [facility] 03/09/2021 10:23 AM

## 2021-03-09 NOTE — Plan of Care (Signed)
  Problem: Education: ?Goal: Knowledge of General Education information will improve ?Description: Including pain rating scale, medication(s)/side effects and non-pharmacologic comfort measures ?Outcome: Not Progressing ?  ?Problem: Health Behavior/Discharge Planning: ?Goal: Ability to manage health-related needs will improve ?Outcome: Not Progressing ?  ?Problem: Clinical Measurements: ?Goal: Ability to maintain clinical measurements within normal limits will improve ?Outcome: Not Progressing ?Goal: Diagnostic test results will improve ?Outcome: Not Progressing ?  ?Problem: Activity: ?Goal: Risk for activity intolerance will decrease ?Outcome: Not Progressing ?  ?Problem: Nutrition: ?Goal: Adequate nutrition will be maintained ?Outcome: Progressing ?  ?

## 2021-03-09 NOTE — Progress Notes (Signed)
  Echocardiogram 2D Echocardiogram has been performed.  Merrie Roof F 03/09/2021, 2:21 PM

## 2021-03-09 NOTE — Progress Notes (Signed)
LTM EEG hooked up and running - no initial skin breakdown - push button tested - neuro notified. [facility] monitoring.  

## 2021-03-09 NOTE — Procedures (Addendum)
Patient Name: Summer Cook  MRN: [id number]  Epilepsy Attending: Lora Havens  Referring Physician/Provider: Dr Donnetta Simpers Duration: 03/08/2021 2124 to 03/09/2021 1433  Patient history: 55yo F s/p cardiac arrest. EEG to evaluate for seizure  Level of alertness: comatose  AEDs during EEG study: LEV  Technical aspects: Part of this EEG was obtained using a 10 lead EEG system positioned circumferentially without any parasagittal coverage (rapid EEG). Computer selected EEG is reviewed as well as background features and all clinically significant events.    Description: EEG showed continuous generalized 3 to 6 Hz theta-delta slowing. Generalized periodic discharges with overriding fast activity maximal posterior quadrant at  1-1.5Hz  were also noted which at times appeared rhythmic without definite evolution. Hyperventilation and photic stimulation were not performed.     ABNORMALITY - Periodic discharges, generalized and maximal posterior quadrant( GPD+) - Continuous slow, generalized  IMPRESSION: This study showed evidence of epileptogenicity with generalized onset, maximal in posterior quadrants. The periodic discharges are on the ictal-interictal continuum with high potential for seizures. There was also severe diffuse encephalopathy, nonspecific etiology but in a patient with cardiac arrest is likely related to  anoxic/hypoxic brain injury. No seizures were seen throughout the recording.  Mazikeen Hehn Barbra Sarks

## 2021-03-09 NOTE — Progress Notes (Signed)
Initial Nutrition Assessment  DOCUMENTATION CODES:  Not applicable  INTERVENTION:  Adjust TF as follows: Pivot 1.5 via OGT at 65 ml/h (1560 ml per day) Free Water: 12mL q4h Provides 2340 kcal, 146 gm protein, 1350 ml free water daily (TF+flush) Add rena-vite via tube daily   NUTRITION DIAGNOSIS:  Inadequate oral intake related to inability to eat as evidenced by NPO status.  GOAL:  Provide needs based on ASPEN/SCCM guidelines  MONITOR:  TF tolerance, I & O's, Vent status, Labs  REASON FOR ASSESSMENT:  Consult Enteral/tube feeding initiation and management  ASSESSMENT:  Pt brought to ED via EMS s/p cardiac arrest at her dialysis center. CPR was initiated for about 30 minutes before EMS arrived. Intubated PTA by EMS. PMH relevant for HTN, CHF, CAD, asthma, ITP, h/o PE and CVA, ESRD on HD, GERD, gout, Hx MI.  Pt remains intubated and sedated at the time of assessment. Placed on TF protocol and Vital High protein currently infusing at 7mL/h. Pt requiring some pressor support, no propofol is being utilized at this time. Will adjust TF to meet pt's nutrition needs. Noted hypoglycemic at times.  6/10 - Intubated, admitted to ICU, CRRT initiated  OGT in place - terminates gastric per XR 6/10  Patient is currently intubated on ventilator support MV: 13.4 L/min Temp (24hrs), Avg:97.9 F (36.6 C), Min:97.5 F (36.4 C), Max:98.3 F (36.8 C)   Intake/Output Summary (Last 24 hours) at 03/09/2021 1552 Last data filed at 03/09/2021 1500 Gross per 24 hour  Intake 3054.83 ml  Output 4759 ml  Net -1704.17 ml  Net IO Since Admission: -2,565.5 mL [03/09/21 1552]  Nutritionally Relevant Medications: Scheduled Meds:  docusate  100 mg Per Tube BID   pantoprazole (PROTONIX) IV  40 mg Intravenous Daily   polyethylene glycol  17 g Per Tube Daily   Continuous Infusions:  dextrose 10 mL/hr at 03/09/21 1500   fentaNYL infusion INTRAVENOUS 50 mcg/hr (03/09/21 1500)   norepinephrine  (LEVOPHED) Adult infusion 12 mcg/min (03/09/21 1500)   Labs Reviewed: K 3.4 BUN 18, creatinine 1.92 Mg 2.6 SBG ranges from 64-106 mg/dL over the last 24 hours  NUTRITION - FOCUSED PHYSICAL EXAM: Defer to in-person assessment  Diet Order:   Diet Order     None       EDUCATION NEEDS:  Not appropriate for education at this time  Skin:  Skin Assessment: Reviewed RN Assessment  Last BM:  6/10  Height:  Ht Readings from Last 1 Encounters:  03/15/2021 5\' 6"  (1.676 m)    Weight:  Wt Readings from Last 1 Encounters:  03/09/21 80 kg    Ideal Body Weight:  59.1 kg  BMI:  Body mass index is 28.47 kg/m.  Estimated Nutritional Needs:  Kcal:  2200-2400 kcal/d Protein:  120-140 g/d Fluid:  1L+UOP   Ranell Patrick, RD, LDN Clinical Dietitian Pager on [place]

## 2021-03-09 NOTE — Progress Notes (Addendum)
Neurology Progress Note  S: No further clinical seizures. Is on fentanyl, propofol d/c'd; on pressors.  EEG this afternoon showed GPDs+ at 2 hz   O: Current vital signs: BP (!) 129/105   Pulse 65   Temp 98.1 F (36.7 C) (Axillary)   Resp (!) 28   Ht _0  (1.676 m)   Wt 80 kg   SpO2 100%   BMI 28.47 kg/m  Vital signs in last 24 hours: Temp:  [97.5 F (36.4 C)-98.3 F (36.8 C)] 98.1 F (36.7 C) (06/12 1204) Pulse Rate:  [65-85] 65 (06/12 1204) Resp:  [6-35] 28 (06/12 1204) BP: (74-146)/(34-125) 129/105 (06/12 1204) SpO2:  [91 %-100 %] 100 % (06/12 1204) Arterial Line BP: (77-153)/(44-76) 101/52 (06/12 1200) FiO2 (%):  [40 %-60 %] 50 % (06/12 1200) Weight:  [80 kg] 80 kg (06/12 0148)  General: Laying comfortably in bed; in no acute distress. HENT: Normal oropharynx and mucosa. Normal external appearance of ears and nose. Neck: Supple, no pain or tenderness CV: No JVD. No peripheral edema. Pulmonary: Symmetric Chest rise. Triggers vent. Abdomen: Soft to touch, non-tender. Ext: No cyanosis, edema, or deformity Skin: No rash. Normal palpation of skin. Musculoskeletal: Normal digits and nails by inspection. No clubbing.   Neurologic Examination on fentanyl    Mental status/Cognition: no response to voice or loud clap. No grimace to pain.   Brainstem Corneal: + BL Pupils: 14m BL, round and reactive to light Cough: + Gag: absent   Motor/Sensory: Muscle bulk: poor, tone increased extensor tone in all extremities.   Upgoing toe noted to any stimulation in her BL feet. No response to pain in BL upper extremities.  Medications  Current Facility-Administered Medications:     prismasol BGK 4/2.5 infusion, , CRRT, Continuous, CDonato Heinz MD, Last Rate: 300 mL/hr at 03/09/21 1005, New Bag at 03/09/21 1005    prismasol BGK 4/2.5 infusion, , CRRT, Continuous, CDonato Heinz MD, Last Rate: 300 mL/hr at 03/09/21 1014, New Bag at 03/09/21 1014   0.9 %  sodium  chloride infusion, 250 mL, Intravenous, Continuous, Ogan, Okoronkwo U, MD, Last Rate: 10 mL/hr at 03/09/21 1200, Infusion Verify at 03/09/21 1200   acetaminophen (TYLENOL) 160 MG/5ML solution 650 mg, 650 mg, Per Tube, Q6H, SJennelle HumanB, NP, 650 mg at 03/09/21 1136   albuterol (PROVENTIL) (2.5 MG/3ML) 0.083% nebulizer solution 2.5 mg, 2.5 mg, Nebulization, Q4H PRN, SJennelle HumanB, NP   ceFEPIme (MAXIPIME) 2 g in sodium chloride 0.9 % 100 mL IVPB, 2 g, Intravenous, Q12H, BLaren Everts RPH, Stopped at 03/09/21 0936   chlorhexidine gluconate (MEDLINE KIT) (PERIDEX) 0.12 % solution 15 mL, 15 mL, Mouth Rinse, BID, Icard, Bradley L, DO, 15 mL at 03/09/21 0745   Chlorhexidine Gluconate Cloth 2 % PADS 6 each, 6 each, Topical, Daily, Icard, Bradley L, DO, 6 each at 03/09/21 0911   dextrose 10 % infusion, , Intravenous, Continuous, Ogan, Okoronkwo U, MD, Last Rate: 10 mL/hr at 03/09/21 1200, Infusion Verify at 03/09/21 1200   docusate (COLACE) 50 MG/5ML liquid 100 mg, 100 mg, Per Tube, BID, SJennelle HumanB, NP, 100 mg at 03/09/21 0906   feeding supplement (PROSource TF) liquid 45 mL, 45 mL, Per Tube, BID, Icard, Bradley L, DO, 45 mL at 03/09/21 0906   feeding supplement (VITAL HIGH PROTEIN) liquid 1,000 mL, 1,000 mL, Per Tube, Q24H, Icard, Bradley L, DO, 1,000 mL at 03/09/21 1137   fentaNYL (SUBLIMAZE) injection 100 mcg, 100 mcg, Intravenous, Once, HTh[place] JGreggory Brandy MD  fentaNYL 2562mg in NS 2592m(1057mml) infusion-PREMIX, 0-400 mcg/hr, Intravenous, Continuous, Hong, JosGreggory BrandyD, Last Rate: 5 mL/hr at 03/09/21 1200, 50 mcg/hr at 03/09/21 1200   heparin injection 1,000-6,000 Units, 1,000-6,000 Units, CRRT, PRN, ColDonato HeinzD   heparin injection 5,000 Units, 5,000 Units, Subcutaneous, Q8H, SimJennelle Human NP, 5,000 Units at 03/09/21 0602   heparinized saline (2000 units/L) primer fluid for CRRT, , CRRT, PRN, Icard, Bradley L, DO, Last Rate: 0 mL/hr at 03/09/21 0315, New Bag at 03/09/21  0315   levETIRAcetam (KEPPRA) IVPB 1000 mg/100 mL premix, 1,000 mg, Intravenous, Q12H, Icard, Bradley L, DO, Stopped at 03/09/21 0758   LORazepam (ATIVAN) injection 2 mg, 2 mg, Intravenous, Q30 min PRN, Icard, Bradley L, DO, 2 mg at 03/08/21 1824   MEDLINE mouth rinse, 15 mL, Mouth Rinse, 10 times per day, Icard, Bradley L, DO, 15 mL at 03/09/21 1141   norepinephrine (LEVOPHED) 16 mg in 250m11memix infusion, 0-40 mcg/min, Intravenous, Titrated, Icard, Bradley L, DO, Last Rate: 8.44 mL/hr at 03/09/21 1200, 9 mcg/min at 03/09/21 1200   pantoprazole (PROTONIX) injection 40 mg, 40 mg, Intravenous, Daily, SimpJennelle HumanNP, 40 mg at 03/09/21 0910   polyethylene glycol (MIRALAX / GLYCOLAX) packet 17 g, 17 g, Per Tube, Daily, SimpJennelle HumanNP, 17 g at 03/09/21 0906   prismasol BGK 4/2.5 infusion, , CRRT, Continuous, Coladonato, JoseBroadus John, Last Rate: 1,500 mL/hr at 03/09/21 0959, New Bag at 03/09/21 0959   propofol (DIPRIVAN) 1000 MG/100ML infusion, 5-80 mcg/kg/min, Intravenous, Continuous, Hong, JoshGreggory Brandy, Stopped at 03/08/21 1123   sodium chloride flush (NS) 0.9 % injection 10-40 mL, 10-40 mL, Intracatheter, Q12H, IzquTyna Jaksch   sodium chloride flush (NS) 0.9 % injection 10-40 mL, 10-40 mL, Intracatheter, PRN, IzquTyna Jaksch  Pertinent Labs aPTT 34.    Glucose 128.    Blood cultures NTD.   Respiratory culture NTD.      Imaging MD has reviewed images in epic and the results pertinent to this consultation are:  CT Head Somewhat limited by patient motion artifact. Mild atrophic changes. No acute intracranial abnormality is noted.    MRI Brain without contrast is ordered.    Assessment : 55 y38female who was admitted 03/08/21 after suffering cardiac arrest at dialysis center and was down for 30 mins before ROSC. She had a witnessed seizure 03/08/21 and was started on LEV. Her EEG pattern today has GPDs+ that are intermittently up to 2 hz.   Plan: - Renal dosing for all  AEDs (ESRD on CRRT) - Continue LEV 1g bid - Start low versed 0.5 mg/hr given increased freq GPDs+ (would not be aggressive unless freq exceeds 2.5 hz c/w NCSE) - Continue cEEG - MRI brain wo  -seizure precautions - Prognostication when able to wean sedation  Will continue to follow  This patient is critically ill and at significant risk of neurological worsening, death and care requires constant monitoring of vital signs, hemodynamics,respiratory and cardiac monitoring, neurological assessment, discussion with family, other specialists and medical decision making of high complexity. I spent 35 minutes of neurocritical care time  in the care of  this patient. This was time spent independent of any time provided by nurse practitioner or PA.  [facility] [phone] 7pm- 7am, please page neurology on call as listed in AMIOPaysonCollSu [facility]ks [facility] [phone] 7pm- 7am, please page neurology on call as listed in AM[place]

## 2021-03-10 ENCOUNTER — Inpatient Hospital Stay (HOSPITAL_COMMUNITY): Payer: Medicare Other

## 2021-03-10 DIAGNOSIS — Z992 Dependence on renal dialysis: Secondary | ICD-10-CM | POA: Diagnosis not present

## 2021-03-10 DIAGNOSIS — N186 End stage renal disease: Secondary | ICD-10-CM | POA: Diagnosis not present

## 2021-03-10 LAB — APTT: aPTT: 62 seconds — ABNORMAL HIGH (ref 24–36)

## 2021-03-10 LAB — GLUCOSE, CAPILLARY
Glucose-Capillary: 119 mg/dL — ABNORMAL HIGH (ref 70–99)
Glucose-Capillary: 116 mg/dL — ABNORMAL HIGH (ref 70–99)
Glucose-Capillary: 174 mg/dL — ABNORMAL HIGH (ref 70–99)
Glucose-Capillary: 105 mg/dL — ABNORMAL HIGH (ref 70–99)
Glucose-Capillary: 132 mg/dL — ABNORMAL HIGH (ref 70–99)
Glucose-Capillary: 105 mg/dL — ABNORMAL HIGH (ref 70–99)

## 2021-03-10 LAB — CBC
HCT: 36.2 % (ref 36.0–46.0)
Hemoglobin: 11.7 g/dL — ABNORMAL LOW (ref 12.0–15.0)
MCH: 31.7 pg (ref 26.0–34.0)
MCHC: 32.3 g/dL (ref 30.0–36.0)
MCV: 98.1 fL (ref 80.0–100.0)
Platelets: 84 10*3/uL — ABNORMAL LOW (ref 150–400)
RBC: 3.69 MIL/uL — ABNORMAL LOW (ref 3.87–5.11)
RDW: 15 % (ref 11.5–15.5)
WBC: 12.6 10*3/uL — ABNORMAL HIGH (ref 4.0–10.5)
nRBC: 0.3 % — ABNORMAL HIGH (ref 0.0–0.2)

## 2021-03-10 LAB — CBG MONITORING, ED: Glucose-Capillary: 156 mg/dL — ABNORMAL HIGH (ref 70–99)

## 2021-03-10 LAB — RENAL FUNCTION PANEL
Albumin: 2.5 g/dL — ABNORMAL LOW (ref 3.5–5.0)
Albumin: 2.6 g/dL — ABNORMAL LOW (ref 3.5–5.0)
Anion gap: 9 (ref 5–15)
Anion gap: 10 (ref 5–15)
BUN: 21 mg/dL — ABNORMAL HIGH (ref 6–20)
BUN: 22 mg/dL — ABNORMAL HIGH (ref 6–20)
CO2: 26 mmol/L (ref 22–32)
CO2: 26 mmol/L (ref 22–32)
Calcium: 8.6 mg/dL — ABNORMAL LOW (ref 8.9–10.3)
Calcium: 8.7 mg/dL — ABNORMAL LOW (ref 8.9–10.3)
Chloride: 100 mmol/L (ref 98–111)
Chloride: 100 mmol/L (ref 98–111)
Creatinine, Ser: 1.45 mg/dL — ABNORMAL HIGH (ref 0.44–1.00)
Creatinine, Ser: 1.26 mg/dL — ABNORMAL HIGH (ref 0.44–1.00)
GFR, Estimated: 43 mL/min — ABNORMAL LOW (ref >60)
GFR, Estimated: 50 mL/min — ABNORMAL LOW (ref >60)
Glucose, Bld: 130 mg/dL — ABNORMAL HIGH (ref 70–99)
Glucose, Bld: 126 mg/dL — ABNORMAL HIGH (ref 70–99)
Phosphorus: 1.5 mg/dL — ABNORMAL LOW (ref 2.5–4.6)
Phosphorus: 1.5 mg/dL — ABNORMAL LOW (ref 2.5–4.6)
Potassium: 3.5 mmol/L (ref 3.5–5.1)
Potassium: 4 mmol/L (ref 3.5–5.1)
Sodium: 135 mmol/L (ref 135–145)
Sodium: 136 mmol/L (ref 135–145)

## 2021-03-10 LAB — CULTURE, RESPIRATORY W GRAM STAIN: Culture: NORMAL

## 2021-03-10 LAB — MAGNESIUM
Magnesium: 2.7 mg/dL — ABNORMAL HIGH (ref 1.7–2.4)
Magnesium: 2.6 mg/dL — ABNORMAL HIGH (ref 1.7–2.4)

## 2021-03-10 MED ORDER — SENNOSIDES 8.8 MG/5ML PO SYRP
5.0000 mL | ORAL_SOLUTION | Freq: Every day | ORAL | Status: DC
  Administered 2021-03-10 – 2021-03-12 (×3): 5 mL
  Filled 2021-03-10 (×3): qty 5

## 2021-03-10 MED ORDER — SODIUM CHLORIDE 0.9 % IV SOLN
2.0000 g | INTRAVENOUS | Status: DC
  Filled 2021-03-10: qty 20

## 2021-03-10 MED ORDER — SODIUM PHOSPHATES 45 MMOLE/15ML IV SOLN
20.0000 mmol | Freq: Once | INTRAVENOUS | Status: AC
  Administered 2021-03-10: 20 mmol via INTRAVENOUS
  Filled 2021-03-10: qty 6.67

## 2021-03-10 MED ORDER — SODIUM CHLORIDE 0.9 % IV SOLN
1.0000 g | INTRAVENOUS | Status: DC
  Filled 2021-03-10: qty 10

## 2021-03-10 MED ORDER — MIDODRINE HCL 5 MG PO TABS
5.0000 mg | ORAL_TABLET | Freq: Three times a day (TID) | ORAL | Status: DC
  Administered 2021-03-10 – 2021-03-13 (×10): 5 mg via ORAL
  Filled 2021-03-10 (×10): qty 1

## 2021-03-10 MED FILL — Morphine Sulfate Inj 4 MG/ML: INTRAMUSCULAR | Qty: 1 | Status: AC

## 2021-03-10 NOTE — Progress Notes (Signed)
Pt's daughter Danae Chen, states she is available to meet Wed 6/15 at 1300 for [place] meeting.

## 2021-03-10 NOTE — Procedures (Addendum)
Patient Name: Dorothee Napierkowski  MRN: [id number]  Epilepsy Attending: Lora Havens  Referring Physician/Provider: Dr Donnetta Simpers Duration: 03/09/2021 1433 to 03/10/2021 1433   Patient history: 55yo F s/p cardiac arrest. EEG to evaluate for seizure   Level of alertness: comatose   AEDs during EEG study: LEV, versed   Technical aspects: This EEG study was done with scalp electrodes positioned according to the 10-20 International system of electrode placement. Electrical activity was acquired at a sampling rate of 500Hz  and reviewed with a high frequency filter of 70Hz  and a low frequency filter of 1Hz . EEG data were recorded continuously and digitally stored.    Description: EEG showed continuous generalized 3 to 6 Hz theta-delta slowing. Generalized periodic discharges ( GPD) with overriding fast activity, maximal posterior quadrant, initially at 1 to 1.5 Hz and gradually worsened to 2-2.5 Hz were noted. At times, GPDs appeared rhythmic without definite evolution.  As Versed was increased, the frequency of GPDs again improved to 1 to 1.5 Hz.  Hyperventilation and photic stimulation were not performed.      ABNORMALITY - Periodic discharges, generalized and maximal posterior quadrant( GPD+) - Continuous slow, generalized   IMPRESSION: This study showed evidence of epileptogenicity with generalized onset, maximal in posterior quadrants. The periodic discharges are on the ictal-interictal continuum with high potential for seizures. There was also severe diffuse encephalopathy, nonspecific etiology but in a patient with cardiac arrest is likely related to  anoxic/hypoxic brain injury. No seizures were seen throughout the recording.   Tehran Rabenold Barbra Sarks

## 2021-03-10 NOTE — Progress Notes (Signed)
LTM maintenance completed; no skin breakdown was seen.

## 2021-03-10 NOTE — Progress Notes (Signed)
[facility] Progress Note   Assessment/ Plan:   Dialysis Orders: [facility]: [facility]  on Mondays and Fridays. EDW 75.5 HD Bath 2K/2.5Ca  Time 210 minutes Heparin 1000 units bolus then 500 units/hr. Access L IJ TDC BFR 400 DFR 500      Assessment/Plan:  Cardiac arrest - PEA arrest requiring 30 minutes of CPR.  Presumably due to hypoxia.  Trend troponins per PCCM and keep MAPS >65 per PCCM.  TTE 03/09/21 with EF 45-50% and global hypokinesis Acute hypoxic respiratory failure - Vented, improved O2 requirement since UF  ESRD -  started on CRRT 03/19/2021 with UF 50-150 ml/hr, CRRT orders:  4K/2.5Ca bath for dialysate 1000 ml/hr, pre-filter 300 ml/hr, and post-filter replacement fluids 300 ml/hr Anticoagulation heparin- close attention to platelets  Hypertension/volume  - Evidence of pulmonary edema on CXR and elevated BNP.  UF with CRRT as tolerated.  Now requiring pressors with UF.  Anemia  - Hgb stable, no epo on outpatient orders  Metabolic bone disease -  hold meds for now  Nutrition - npo for now. Hypokalemia - given 2 runs of Kcl yesterday. Hypomagnesemia - repleted with iv mag and follow Acute encephalopathy: Neuro following, on cEEG and plan for MRI later today hopefully  Subjective:    On cEEG for now.  For MRI today hopefully.  Heparin resumed overnight for frequent clotting, better now   Objective:   BP 120/75   Pulse 74   Temp 98.2 F (36.8 C) (Axillary)   Resp (!) 28   Ht $R'5\' 6"'cT$  (1.676 m)   Wt 78.4 kg   SpO2 98%   BMI 27.90 kg/m   Physical Exam: Gen: intubated, NAD CVS: tachycardic Resp: coarse mechanical Abd: soft Ext: 1+ LE edema ACCESS: L IJ TDC, L AVF + T/B  Labs: BMET Recent Labs  Lab 03/21/2021 1754 03/12/2021 2230 03/08/21 0338 03/08/21 0405 03/08/21 0620 03/08/21 1318 03/08/21 1636 03/09/21 0204 03/09/21 0336 03/09/21 1539 03/10/21 0202  NA 138 137 127* 139 139  --  136 135 137 135 135  K 2.5* 2.4* 2.7* 3.1* 3.0*  --  4.3  3.4* 3.4* 3.4* 3.5  CL 102 103 96*  --   --   --  99 100  --  102 100  CO2 24 20* 14*  --   --   --  21* 23  --  24 26  GLUCOSE 131* 104* 75  --   --   --  109* 128*  --  115* 130*  BUN 31* 31* 25*  --   --   --  21* 18  --  20 21*  CREATININE 3.62* 3.92* 3.09*  --   --   --  2.74* 1.92*  --  1.56* 1.45*  CALCIUM 8.2* 8.1* 6.9*  --   --   --  8.0* 8.6*  --  8.7* 8.6*  PHOS  --  4.1 3.2  --   --  3.2 3.1  3.1 2.5  --  2.1*  2.1* 1.5*   CBC Recent Labs  Lab 03/02/2021 1754 03/19/2021 2230 03/08/21 0338 03/08/21 0405 03/08/21 0620 03/09/21 0204 03/09/21 0336 03/10/21 0202  WBC 13.4* 6.9 6.2  --   --  18.4*  --  12.6*  NEUTROABS 9.9*  --   --   --   --   --   --   --   HGB 11.2* 11.0* 12.0   < > 12.6 12.3 11.9* 11.7*  HCT 35.9* 33.6*  32.5*   < > 37.0 36.5 35.0* 36.2  MCV 103.8* 96.8 100.6*  --   --  96.6  --  98.1  PLT 110* 126* 100*  --   --  98*  --  84*   < > = values in this interval not displayed.      Medications:     chlorhexidine gluconate (MEDLINE KIT)  15 mL Mouth Rinse BID   Chlorhexidine Gluconate Cloth  6 each Topical Daily   docusate  100 mg Per Tube BID   fentaNYL (SUBLIMAZE) injection  100 mcg Intravenous Once   heparin injection (subcutaneous)  5,000 Units Subcutaneous Q8H   mouth rinse  15 mL Mouth Rinse 10 times per day   midodrine  5 mg Oral TID WC   multivitamin  1 tablet Per Tube QHS   pantoprazole (PROTONIX) IV  40 mg Intravenous Daily   polyethylene glycol  17 g Per Tube Daily   sennosides  5 mL Per Tube Daily   sodium chloride flush  10-40 mL Intracatheter Q12H     Madelon Lips MD 03/10/2021, 9:28 AM

## 2021-03-10 NOTE — Plan of Care (Signed)
  Problem: Nutrition: Goal: Adequate nutrition will be maintained Outcome: Progressing   Problem: Elimination: Goal: Will not experience complications related to urinary retention Outcome: Not Applicable   Problem: Pain Managment: Goal: General experience of comfort will improve Outcome: Progressing   Problem: Safety: Goal: Ability to remain free from injury will improve Outcome: Progressing   Problem: Skin Integrity: Goal: Risk for impaired skin integrity will decrease Outcome: Not Progressing

## 2021-03-10 NOTE — Plan of Care (Signed)

## 2021-03-10 NOTE — Progress Notes (Addendum)
NAME:  Summer Cook, MRN:  [id number], DOB:  1966/08/07, LOS: 3 ADMISSION DATE:  03/19/2021, CONSULTATION DATE:  03/03/2021 REFERRING MD:  Dr. Almyra Free, CHIEF COMPLAINT:   Cardiac arrest  Brief Narrative:  This is a 55 yo who presents sp Cardiac arrest. Arrested during dialysis. Was unresponsive requiring CPR for 30 minutes. 1 of EPI given by EMS. EMS placed king airway as well transitioned to ETT without need for RSI.   Pertinent  Medical History  Allergy Asthma  CAD,  CHF,  ESRD (T/TH/S [facility]),  HTN,  GERD ITP PE  stroke  Significant Hospital Events: Including procedures, antibiotic start and stop dates in addition to other pertinent events   6/10 Patient transferred from [facility] after suffering cardiac arrest at outpatient HD session. 30 mins of ACLS prior to EMS arrival. [facility] airway exchanged for ETT in ED with no RSI meds  6/11 Issues with oxygenation overnight required initiation of low-dose Levophed due to hypotension on CRRT.  Mildly hypothermic and hypoglycemic 6/12 Seen with new onset tonic clonic seizures afternoon of 6/11 once propofol weaned off. EEG started and neuro consulted   Interim History / Subjective:  No acute events overnight  Remains on EEG and CRRT   Objective   Blood pressure 106/70, pulse 73, temperature 97.6 F (36.4 C), temperature source Axillary, resp. rate (!) 28, height 5\' 6"  (1.676 m), weight 78.4 kg, SpO2 100 %.    Vent Mode: PRVC FiO2 (%):  [40 %-50 %] 40 % Set Rate:  [28 bmp-35 bmp] 28 bmp Vt Set:  [470 mL] 470 mL PEEP:  [5 cmH20-8 cmH20] 5 cmH20 Plateau Pressure:  [19 cmH20-24 cmH20] 19 cmH20   Intake/Output Summary (Last 24 hours) at 03/10/2021 0649 Last data filed at 03/10/2021 0600 Gross per 24 hour  Intake 2963.84 ml  Output 6437 ml  Net -3473.16 ml    Filed Weights   03/08/21 0127 03/09/21 0148 03/10/21 0205  Weight: 81.4 kg 80 kg 78.4 kg    Examination: Constitutional: ill appearing woman on vent  Eyes: pupils  large but reactive, equal Ears, nose, mouth, and throat: ETT in place, minimal secretions Cardiovascular: RRR, ext warm Respiratory: mechanical sounds, triggers vent Gastrointestinal: soft, hypoactive BS Skin: No rashes, normal turgor Neurologic: opens eyes to painful stimuli, not following commands, rhythmic twitching of toes R>L question ongoing status myoclonus vs. epilepticus Psychiatric: cannot assess  Chemistries fine except low phos: defer to nephro Plts mildly downtrending WBC downtrending Culture neg to date No chest imaging  Labs/imaging that I have personally reviewed   CTA chest from 03/05/2021 Bilateral consolidation primarily in the lower lobes with associated diffuse pulmonary edema right greater than left similar to that seen on prior plain film. Large left pleural effusion. Cirrhosis. Multiple rib fractures  CT head  from 03/17/2021 Somewhat limited by patient motion artifact. Mild atrophic changes. 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 atypical pulmonary edema ESRD on TThS HD PTA Post arrest seizure and encephalopathy question anoxic Hypoglycemia- on low dose D10 drip Developing thrombocytopenia- low 4T score, just watch for now  - Vent support, VAP prevention bundle - Ongoing EEG. AEDs, gtts per neuro - Sedation wean hopefully today - I think close to dry  weight, fine to run even vs. Try to switch back to intermittent depending on Bps - Continue pressors although suspect this may be related to sedation - Switch cefepime to ceftriaxone (allergies don't make sense, will ignore) to reduce EEG confounding, culture data pending, repeat CXR,  question developing R parapneumonic effusion - MRI today to help prognostication; hopefully can breakdown EEG to make this happen  Best practice   Diet:  TF Pain/Anxiety/Delirium protocol (if indicated): Yes (RASS goal -1) VAP protocol (if indicated): Yes DVT prophylaxis: Subcutaneous Heparin GI prophylaxis: H2B Glucose control:  D10 Central venous access:  Yes, and it is still needed Arterial line:  N/A Foley:  Yes, and it is still needed Mobility:  bed rest  PT consulted: N/A Last date of multidisciplinary goals of care discussion [6/11. Spoke with daughter. She would like to keep full code for now.] will address after MRI Code Status:  full code.  Disposition: ICU     Patient critically ill due to respiratory failure, encephalopathy Interventions to address this today sedation and vent titration Risk of deterioration without these interventions is high  I personally spent 39 minutes providing critical care not including any separately billable procedures  Erskine Emery MD [facility]  Prefer epic messenger for cross cover needs If after hours, please call E-link  Update: CXR showing clearing of R infiltrate most c/w fluid; DC rocephin, monitor fever curve

## 2021-03-10 NOTE — Progress Notes (Addendum)
Subjective: No clinical seizures when I examined the patient.  But per RN, she did notice intermittent foot twitching.  ROS: Unable to obtain due to poor mental status  Examination  Vital signs in last 24 hours: Temp:  [97 F (36.1 C)-98.2 F (36.8 C)] 97.7 F (36.5 C) (06/13 1100) Pulse Rate:  [65-75] 75 (06/13 1145) Resp:  [9-28] 28 (06/13 1215) BP: (96-123)/(50-104) 113/69 (06/13 1200) SpO2:  [96 %-100 %] 96 % (06/13 1145) Arterial Line BP: (78-210)/(11-62) 112/57 (06/13 1215) FiO2 (%):  [40 %] 40 % (06/13 1113) Weight:  [78.4 kg] 78.4 kg (06/13 0205)  General: lying in bed,  CVS: pulse-normal rate and rhythm RS: Intubated, coarse breath sounds bilaterally Extremities: normal, warm Neuro: Comatose, does not open eyes to noxious stimuli, does not follow commands, PERRLA, no gaze deviation, corneal reflex intact, gag reflex intact, subtle withdrawal to noxious stimuli in left upper and lower extremity, does not withdraw in right upper and lower extremity  Basic Metabolic Panel: Recent Labs  Lab 03/08/21 0338 03/08/21 0405 03/08/21 1318 03/08/21 1636 03/09/21 0204 03/09/21 0336 03/09/21 1539 03/10/21 0202  NA 127*   < >  --  136 135 137 135 135  K 2.7*   < >  --  4.3 3.4* 3.4* 3.4* 3.5  CL 96*  --   --  99 100  --  102 100  CO2 14*  --   --  21* 23  --  24 26  GLUCOSE 75  --   --  109* 128*  --  115* 130*  BUN 25*  --   --  21* 18  --  20 21*  CREATININE 3.09*  --   --  2.74* 1.92*  --  1.56* 1.45*  CALCIUM 6.9*  --   --  8.0* 8.6*  --  8.7* 8.6*  MG 1.7  --  2.4 2.5* 2.6*  --  2.6* 2.7*  PHOS 3.2  --  3.2 3.1  3.1 2.5  --  2.1*  2.1* 1.5*   < > = values in this interval not displayed.    CBC: Recent Labs  Lab 03/22/2021 1754 03/16/2021 2230 03/08/21 0338 03/08/21 0405 03/08/21 0620 03/09/21 0204 03/09/21 0336 03/10/21 0202  WBC 13.4* 6.9 6.2  --   --  18.4*  --  12.6*  NEUTROABS 9.9*  --   --   --   --   --   --   --   HGB 11.2* 11.0* 12.0 13.3 12.6 12.3  11.9* 11.7*  HCT 35.9* 33.6* 32.5* 39.0 37.0 36.5 35.0* 36.2  MCV 103.8* 96.8 100.6*  --   --  96.6  --  98.1  PLT 110* 126* 100*  --   --  98*  --  84*     Coagulation Studies: Recent Labs    03/08/21 0338  LABPROT 16.5*  INR 1.3*    Imaging  CT head without contrast 02/27/2021: No acute abnormality  ASSESSMENT AND PLAN: 55 year old female status post cardiac arrest, 30 minutes before ROSC was achieved.  Cardiac arrest Suspected anoxic/hypoxic brain injury End-stage renal disease on dialysis Hypoalbuminemia Hypophosphatemia Leukocytosis Anemia Thrombocytopenia -LTM EEG showing generalized periodic discharges ( GPD) with overriding fast activity, maximal posterior quadrant at 2-2.5 Hz.   Recommendation -Continue Keppra 1000 mg twice daily.  Already thrombocytopenic so avoiding Depakote -On Versed 0.59ml/hr, also on pressors.  Therefore will cautiously increase with goal to suppress epileptiform discharges -We will obtain MRI brain without contrast to  look for anoxic/hypoxic brain injury -Seizure precautions -As needed IV Versed 5 mg for clinical seizure-like activity -Prolonged downtime, myoclonic seizures in the first 24 hours is suggestive of severe neurologic injury and portends poor prognosis. Continue goals of care conversation with family  CRITICAL CARE Performed by: Lora Havens   Total critical care time: 35 minutes  Critical care time was exclusive of separately billable procedures and treating other patients.  Critical care was necessary to treat or prevent imminent or life-threatening deterioration.  Critical care was time spent personally by me on the following activities: development of treatment plan with patient and/or surrogate as well as nursing, discussions with consultants, evaluation of patient's response to treatment, examination of patient, obtaining history from patient or surrogate, ordering and performing treatments and interventions,  ordering and review of laboratory studies, ordering and review of radiographic studies, pulse oximetry and re-evaluation of patient's condition.   Zeb Comfort Epile[facility] For questions after 5pm please refer to AMION to reach the Neurologist on call

## 2021-03-11 ENCOUNTER — Inpatient Hospital Stay (HOSPITAL_COMMUNITY): Payer: Medicare Other

## 2021-03-11 DIAGNOSIS — N186 End stage renal disease: Secondary | ICD-10-CM | POA: Diagnosis not present

## 2021-03-11 DIAGNOSIS — Z992 Dependence on renal dialysis: Secondary | ICD-10-CM | POA: Diagnosis not present

## 2021-03-11 LAB — CBC
HCT: 39.2 % (ref 36.0–46.0)
Hemoglobin: 12.6 g/dL (ref 12.0–15.0)
MCH: 32.1 pg (ref 26.0–34.0)
MCHC: 32.1 g/dL (ref 30.0–36.0)
MCV: 99.7 fL (ref 80.0–100.0)
Platelets: 80 10*3/uL — ABNORMAL LOW (ref 150–400)
RBC: 3.93 MIL/uL (ref 3.87–5.11)
RDW: 15.2 % (ref 11.5–15.5)
WBC: 12.8 10*3/uL — ABNORMAL HIGH (ref 4.0–10.5)
nRBC: 0.4 % — ABNORMAL HIGH (ref 0.0–0.2)

## 2021-03-11 LAB — RENAL FUNCTION PANEL
Albumin: 2.7 g/dL — ABNORMAL LOW (ref 3.5–5.0)
Albumin: 2.4 g/dL — ABNORMAL LOW (ref 3.5–5.0)
Anion gap: 9 (ref 5–15)
Anion gap: 10 (ref 5–15)
BUN: 26 mg/dL — ABNORMAL HIGH (ref 6–20)
BUN: 34 mg/dL — ABNORMAL HIGH (ref 6–20)
CO2: 25 mmol/L (ref 22–32)
CO2: 26 mmol/L (ref 22–32)
Calcium: 8.7 mg/dL — ABNORMAL LOW (ref 8.9–10.3)
Calcium: 8.6 mg/dL — ABNORMAL LOW (ref 8.9–10.3)
Chloride: 100 mmol/L (ref 98–111)
Chloride: 100 mmol/L (ref 98–111)
Creatinine, Ser: 1.26 mg/dL — ABNORMAL HIGH (ref 0.44–1.00)
Creatinine, Ser: 1.61 mg/dL — ABNORMAL HIGH (ref 0.44–1.00)
GFR, Estimated: 50 mL/min — ABNORMAL LOW (ref >60)
GFR, Estimated: 38 mL/min — ABNORMAL LOW (ref >60)
Glucose, Bld: 145 mg/dL — ABNORMAL HIGH (ref 70–99)
Glucose, Bld: 130 mg/dL — ABNORMAL HIGH (ref 70–99)
Phosphorus: 1.1 mg/dL — ABNORMAL LOW (ref 2.5–4.6)
Phosphorus: 1.4 mg/dL — ABNORMAL LOW (ref 2.5–4.6)
Potassium: 5.2 mmol/L — ABNORMAL HIGH (ref 3.5–5.1)
Potassium: 4.7 mmol/L (ref 3.5–5.1)
Sodium: 134 mmol/L — ABNORMAL LOW (ref 135–145)
Sodium: 136 mmol/L (ref 135–145)

## 2021-03-11 LAB — GLUCOSE, CAPILLARY
Glucose-Capillary: 112 mg/dL — ABNORMAL HIGH (ref 70–99)
Glucose-Capillary: 114 mg/dL — ABNORMAL HIGH (ref 70–99)
Glucose-Capillary: 119 mg/dL — ABNORMAL HIGH (ref 70–99)
Glucose-Capillary: 125 mg/dL — ABNORMAL HIGH (ref 70–99)
Glucose-Capillary: 136 mg/dL — ABNORMAL HIGH (ref 70–99)
Glucose-Capillary: 86 mg/dL (ref 70–99)

## 2021-03-11 LAB — MAGNESIUM: Magnesium: 2.6 mg/dL — ABNORMAL HIGH (ref 1.7–2.4)

## 2021-03-11 LAB — APTT: aPTT: 78 seconds — ABNORMAL HIGH (ref 24–36)

## 2021-03-11 MED ORDER — SODIUM PHOSPHATES 45 MMOLE/15ML IV SOLN
20.0000 mmol | Freq: Once | INTRAVENOUS | Status: AC
  Administered 2021-03-11: 20 mmol via INTRAVENOUS
  Filled 2021-03-11: qty 6.67

## 2021-03-11 MED ORDER — SODIUM PHOSPHATES 45 MMOLE/15ML IV SOLN
20.0000 mmol | Freq: Once | INTRAVENOUS | Status: DC
  Filled 2021-03-11: qty 6.67

## 2021-03-11 MED ORDER — SODIUM PHOSPHATES 45 MMOLE/15ML IV SOLN
10.0000 mmol | Freq: Once | INTRAVENOUS | Status: AC
  Administered 2021-03-11: 10 mmol via INTRAVENOUS
  Filled 2021-03-11: qty 3.33

## 2021-03-11 NOTE — Progress Notes (Signed)
[facility] Progress Note   Assessment/ Plan:   Dialysis Orders: [facility]  on Mondays and Fridays. EDW 75.5 HD Bath 2K/2.5Ca  Time 210 minutes Heparin 1000 units bolus then 500 units/hr. Access L IJ TDC BFR 400 DFR 500      Assessment/Plan:  Cardiac arrest - PEA arrest requiring 30 minutes of CPR.  Presumably due to hypoxia.  Trend troponins per PCCM and keep MAPS >65 per PCCM.  TTE 03/09/21 with EF 45-50% and global hypokinesis Acute hypoxic respiratory failure - Vented, improved O2 requirement since UF  ESRD -  started on CRRT 02/27/2021.  Keeping even today d/t increased pressor requirement.  On heparin  Hypertension/volume   as above in #2  Anemia  - Hgb stable, no epo on outpatient orders  Metabolic bone disease -  hold meds for now  Nutrition - npo for now. Hypokalemia - given 2 runs of Kcl yesterday. Hypomagnesemia - repleted with iv mag and follow Acute encephalopathy: Neuro following, on cEEG and plan for MRI later today hopefully Thrombocytopenia: follow closely  Subjective:    No seizure activity noted on cEEG per neuro.  For MRI today. Pressor requirement up.  Keeping even today.     Objective:   BP (!) 100/50   Pulse 82   Temp 98.3 F (36.8 C) (Axillary)   Resp (!) 24   Ht 5' 6" (1.676 m)   Wt 73 kg   SpO2 94%   BMI 25.98 kg/m   Physical Exam: Gen: intubated, NAD CVS: tachycardic Resp: coarse mechanical Abd: soft Ext: 1+ LE edema ACCESS: L IJ TDC, L AVF + T/B  Labs: BMET Recent Labs  Lab 03/08/21 0338 03/08/21 0405 03/08/21 1318 03/08/21 1636 03/09/21 0204 03/09/21 0336 03/09/21 1539 03/10/21 0202 03/10/21 1609 03/11/21 0446  NA 127*   < >  --  136 135 137 135 135 136 134*  K 2.7*   < >  --  4.3 3.4* 3.4* 3.4* 3.5 4.0 5.2*  CL 96*  --   --  99 100  --  102 100 100 100  CO2 14*  --   --  21* 23  --  _0 GLUCOSE 75  --   --  109* 128*  --  115* 130* 126* 145*  BUN 25*  --   --  21* 18  --  20 21* 22*  26*  CREATININE 3.09*  --   --  2.74* 1.92*  --  1.56* 1.45* 1.26* 1.26*  CALCIUM 6.9*  --   --  8.0* 8.6*  --  8.7* 8.6* 8.7* 8.7*  PHOS 3.2  --  3.2 3.1  3.1 2.5  --  2.1*  2.1* 1.5* 1.5* 1.1*   < > = values in this interval not displayed.   CBC Recent Labs  Lab 03/05/2021 1754 03/10/2021 2230 03/08/21 0338 03/08/21 0405 03/09/21 0204 03/09/21 0336 03/10/21 0202 03/11/21 0446  WBC 13.4*   < > 6.2  --  18.4*  --  12.6* 12.8*  NEUTROABS 9.9*  --   --   --   --   --   --   --   HGB 11.2*   < > 12.0   < > 12.3 11.9* 11.7* 12.6  HCT 35.9*   < > 32.5*   < > 36.5 35.0* 36.2 39.2  MCV 103.8*   < > 100.6*  --  96.6  --  98.1 99.7  PLT 110*   < >  100*  --  98*  --  84* 80*   < > = values in this interval not displayed.      Medications:     chlorhexidine gluconate (MEDLINE KIT)  15 mL Mouth Rinse BID   Chlorhexidine Gluconate Cloth  6 each Topical Daily   docusate  100 mg Per Tube BID   heparin injection (subcutaneous)  5,000 Units Subcutaneous Q8H   mouth rinse  15 mL Mouth Rinse 10 times per day   midodrine  5 mg Oral TID WC   multivitamin  1 tablet Per Tube QHS   pantoprazole (PROTONIX) IV  40 mg Intravenous Daily   polyethylene glycol  17 g Per Tube Daily   sennosides  5 mL Per Tube Daily   sodium chloride flush  10-40 mL Intracatheter Q12H     Madelon Lips MD 03/11/2021, 10:04 AM

## 2021-03-11 NOTE — Progress Notes (Signed)
Ventilator patient transported from 2M05 to MRI and back without any complications.  

## 2021-03-11 NOTE — Plan of Care (Signed)

## 2021-03-11 NOTE — Progress Notes (Signed)
Subjective: No acute events overnight.  No family at bedside.  ROS: Unable to obtain due to poor mental status  Examination  Vital signs in last 24 hours: Temp:  [97.9 F (36.6 C)-98.3 F (36.8 C)] 97.9 F (36.6 C) (06/14 1146) Pulse Rate:  [74-91] 81 (06/14 1100) Resp:  [16-28] 24 (06/14 1100) BP: (90-122)/(39-87) 98/58 (06/14 1100) SpO2:  [93 %-97 %] 94 % (06/14 1100) Arterial Line BP: (70-113)/(39-62) 101/57 (06/14 1100) FiO2 (%):  [40 %] 40 % (06/14 1057) Weight:  [73 kg] 73 kg (06/14 0400)  General: lying in bed, not in apparent distress CVS: pulse-normal rate and rhythm RS: Intubated, coarse breath sounds bilaterally Extremities: Bilateral lower extremity edema, warm Neuro:Comatose, does not open eyes to noxious stimuli, does not follow commands, PERRLA, no gaze deviation, corneal reflex intact, gag reflex intact, subtle withdrawal to noxious stimuli in left upper and lower extremity, does not withdraw in right upper and lower extremity  Basic Metabolic Panel: Recent Labs  Lab 03/09/21 0204 03/09/21 0336 03/09/21 1539 03/10/21 0202 03/10/21 1609 03/11/21 0446  NA 135 137 135 135 136 134*  K 3.4* 3.4* 3.4* 3.5 4.0 5.2*  CL 100  --  102 100 100 100  CO2 23  --  24 26 26 25   GLUCOSE 128*  --  115* 130* 126* 145*  BUN 18  --  20 21* 22* 26*  CREATININE 1.92*  --  1.56* 1.45* 1.26* 1.26*  CALCIUM 8.6*  --  8.7* 8.6* 8.7* 8.7*  MG 2.6*  --  2.6* 2.7* 2.6* 2.6*  PHOS 2.5  --  2.1*  2.1* 1.5* 1.5* 1.1*    CBC: Recent Labs  Lab 03/06/2021 1754 03/25/2021 2230 03/08/21 0338 03/08/21 0405 03/08/21 0620 03/09/21 0204 03/09/21 0336 03/10/21 0202 03/11/21 0446  WBC 13.4* 6.9 6.2  --   --  18.4*  --  12.6* 12.8*  NEUTROABS 9.9*  --   --   --   --   --   --   --   --   HGB 11.2* 11.0* 12.0   < > 12.6 12.3 11.9* 11.7* 12.6  HCT 35.9* 33.6* 32.5*   < > 37.0 36.5 35.0* 36.2 39.2  MCV 103.8* 96.8 100.6*  --   --  96.6  --  98.1 99.7  PLT 110* 126* 100*  --   --  98*  --   84* 80*   < > = values in this interval not displayed.     Coagulation Studies: No results for input(s): LABPROT, INR in the last 72 hours.  Imaging MRI brain ordered and pending   ASSESSMENT AND PLAN: 55 year old female status post cardiac arrest, 30 minutes before ROSC was achieved.   Cardiac arrest Suspected anoxic/hypoxic brain injury -LTM EEG showed generalized periodic discharges ( GPD) with overriding fast activity, maximal posterior quadrant at 1-1.5Hz .    Recommendation -Continue Keppra 1000 mg twice daily.  Already thrombocytopenic so avoiding Depakote -Continue Versed at 89ml/hr, can increase if any clinical seizure-like activity -Discontinue LTM EEG to obtain MRI brain without contrast to look for anoxic/hypoxic brain injury -Seizure precautions -As needed IV Versed 5 mg for clinical seizure-like activity -Prolonged downtime, myoclonic seizures in the first 24 hours is suggestive of severe neurologic injury and portends poor prognosis. Continue goals of care conversation with family -Management of rest of comorbidities per primary   CRITICAL CARE Performed by: Lora Havens     Total critical care time: 35 minutes   Critical  care time was exclusive of separately billable procedures and treating other patients.   Critical care was necessary to treat or prevent imminent or life-threatening deterioration.   Critical care was time spent personally by me on the following activities: development of treatment plan with patient and/or surrogate as well as nursing, discussions with consultants, evaluation of patient's response to treatment, examination of patient, obtaining history from patient or surrogate, ordering and performing treatments and interventions, ordering and review of laboratory studies, ordering and review of radiographic studies, pulse oximetry and re-evaluation of patient's condition.      [facility]eb Comfort [facility] For  questions after 5pm please refer to AMION to reach the Neurologist on call

## 2021-03-11 NOTE — Progress Notes (Signed)
NAME:  Summer Cook, MRN:  [id number], DOB:  05-28-1966, LOS: 4 ADMISSION DATE:  03/14/2021, CONSULTATION DATE:  03/02/2021 REFERRING MD:  Dr. Almyra Free, CHIEF COMPLAINT:   Cardiac arrest  Brief Narrative:  This is a 55 yo who presents sp Cardiac arrest. Arrested during dialysis. Was unresponsive requiring CPR for 30 minutes. 1 of EPI given by EMS. EMS placed king airway as well transitioned to ETT without need for RSI.   Pertinent  Medical History  Allergy Asthma  CAD,  CHF,  ESRD (T/TH/S [facility]),  HTN,  GERD ITP PE  stroke  Significant Hospital Events: Including procedures, antibiotic start and stop dates in addition to other pertinent events   6/10 Patient transferred from [facility] after suffering cardiac arrest at outpatient HD session. 30 mins of ACLS prior to EMS arrival. [facility] airway exchanged for ETT in ED with no RSI meds  6/11 Issues with oxygenation overnight required initiation of low-dose Levophed due to hypotension on CRRT.  Mildly hypothermic and hypoglycemic 6/12 Seen with new onset tonic clonic seizures afternoon of 6/11 once propofol weaned off. EEG started and neuro consulted   Interim History / Subjective:  No events. Shortage of MRI staff so MRI moved to today.  Objective   Blood pressure (!) 95/54, pulse 85, temperature 98 F (36.7 C), temperature source Oral, resp. rate (!) 24, height 5\' 6"  (1.676 m), weight 73 kg, SpO2 95 %.    Vent Mode: PRVC FiO2 (%):  [40 %] 40 % Set Rate:  [24 bmp-28 bmp] 24 bmp Vt Set:  [470 mL] 470 mL PEEP:  [5 cmH20] 5 cmH20 Plateau Pressure:  [19 cmH20-22 cmH20] 19 cmH20   Intake/Output Summary (Last 24 hours) at 03/11/2021 0700 Last data filed at 03/11/2021 5732 Gross per 24 hour  Intake 2814.24 ml  Output 5895 ml  Net -3080.76 ml    Filed Weights   03/09/21 0148 03/10/21 0205 03/11/21 0400  Weight: 80 kg 78.4 kg 73 kg    Examination: Constitutional: sedated on vent  Eyes: pupils reactive, equal, not  tracking Ears, nose, mouth, and throat: ETT in place, minimal secretions Cardiovascular: RRR, ext warm Respiratory: mechanical breath sounds, passive on vent Gastrointestinal: soft, +BS Skin: No rashes, normal turgor Neurologic: has rhythmic movement of R toes still; with higher versed doses I am getting no response to pain Psychiatric: cannot assess   K slightly up but hemolyzed Plts slowly going down CBG okay  Labs/imaging that I have personally reviewed   CTA chest from 03/04/2021 Bilateral consolidation primarily in the lower lobes with associated diffuse pulmonary edema right greater than left similar to that seen on prior plain film. Large left pleural effusion. Cirrhosis. Multiple rib fractures  CT head  from 03/12/2021 Somewhat limited by patient motion artifact. Mild atrophic changes. Echo 6/12 40-50, biventricular dysfunction, no WMA  Resolved Hospital Problem list   NA  Assessment & Plan:  Cardiac arrest- question intravascular depletion from diarrhea combined with poor cardiopulmonary reserve Circulatory shock preceding arrest Combined systolic / diastolic heart failure with volume overload- improved CAD Acute hypoxemic respiratory failure on vent- PNA vs. atypical pulmonary edema; CXR cleared rapidly so dc'd abx 6/13; mental status precludes extubation ESRD on TThS HD PTA Post arrest seizure and encephalopathy question anoxic Hypoglycemia- on low dose D10 drip Developing thrombocytopenia- low 4T score, just watch for now  - Vent support, VAP prevention bundle - Ongoing EEG. AEDs, gtts per neuro: versed being increased to reduce GPDs - MRI today -  Fine to run even with CRRT - Wean pressors. - Family meeting tomorrow after MRI for [place]  Best practice   Diet:  TF Pain/Anxiety/Delirium protocol (if indicated): titrated to EEG VAP protocol (if indicated): Yes DVT prophylaxis: Subcutaneous Heparin GI prophylaxis: H2B Glucose control:  D10 Central venous access:  Yes,  and it is still needed Arterial line:  N/A Foley:  No Mobility:  bed rest  PT consulted: N/A Last date of multidisciplinary goals of care discussion: planned for tomorrow after MRI to discuss where to go from here Code Status:  full code.  Disposition: ICU     Patient critically ill due to respiratory failure, encephalopathy Interventions to address this today sedation and vent titration Risk of deterioration without these interventions is high  I personally spent 36 minutes providing critical care not including any separately billable procedures  Erskine Emery MD [facility]  Prefer epic messenger for cross cover needs If after hours, please call E-link  Update: CXR showing clearing of R infiltrate most c/w fluid; DC rocephin, monitor fever curve

## 2021-03-11 NOTE — Procedures (Addendum)
Patient Name: Summer Cook  MRN: [id number]  Epilepsy Attending: Lora Havens  Referring Physician/Provider: Dr Donnetta Simpers Duration: 03/10/2021 1433 to 03/11/2021 1129   Patient history: 55yo F s/p cardiac arrest. EEG to evaluate for seizure   Level of alertness: comatose   AEDs during EEG study: LEV, versed   Technical aspects: This EEG study was done with scalp electrodes positioned according to the 10-20 International system of electrode placement. Electrical activity was acquired at a sampling rate of 500Hz  and reviewed with a high frequency filter of 70Hz  and a low frequency filter of 1Hz . EEG data were recorded continuously and digitally stored.    Description: EEG showed continuous generalized 3 to 6 Hz theta-delta slowing. Generalized periodic discharges ( GPD), maximal posterior quadrant, at 1 to 1.5 Hz were also noted. Hyperventilation and photic stimulation were not performed.      ABNORMALITY - Periodic discharges, generalized and maximal posterior quadrant( GPD+) - Continuous slow, generalized   IMPRESSION: This study showed evidence of epileptogenicity with generalized onset, maximal in posterior quadrants. The periodic discharges are on the ictal-interictal continuum with high potential for seizures. There was also severe diffuse encephalopathy, nonspecific etiology but in a patient with cardiac arrest is likely related to  anoxic/hypoxic brain injury. No seizures were seen throughout the recording.   Summer Cook

## 2021-03-11 NOTE — Progress Notes (Signed)
[facility] Progress Note Patient Name: Summer Cook DOB: 1966/05/02 MRN: [id number]   Date of Service  03/11/2021  HPI/Events of Note  RN reports K 5.2, phos 1.1. Upon further review, slight hemolysis noted on BMP sample by the lab. Was 4.0 yesterday.  Patient is ESRD. Currently NPO. Phos has been dropping for a few days.   eICU Interventions  Ordered 10 mmol sodium phos IV.     Intervention Category Minor Interventions: Electrolytes abnormality - evaluation and management  Charlott Rakes 03/11/2021, 5:36 AM

## 2021-03-11 NOTE — Progress Notes (Signed)
LTM EEG discontinued - no skin breakdown at unhook.   

## 2021-03-12 DIAGNOSIS — G931 Anoxic brain damage, not elsewhere classified: Secondary | ICD-10-CM | POA: Diagnosis not present

## 2021-03-12 DIAGNOSIS — Z992 Dependence on renal dialysis: Secondary | ICD-10-CM

## 2021-03-12 DIAGNOSIS — N186 End stage renal disease: Secondary | ICD-10-CM

## 2021-03-12 DIAGNOSIS — Z515 Encounter for palliative care: Secondary | ICD-10-CM

## 2021-03-12 DIAGNOSIS — Z7189 Other specified counseling: Secondary | ICD-10-CM

## 2021-03-12 LAB — APTT: aPTT: 90 seconds — ABNORMAL HIGH (ref 24–36)

## 2021-03-12 LAB — CBC WITH DIFFERENTIAL/PLATELET
Abs Immature Granulocytes: 0.04 10*3/uL (ref 0.00–0.07)
Basophils Absolute: 0 10*3/uL (ref 0.0–0.1)
Basophils Relative: 0 %
Eosinophils Absolute: 0.1 10*3/uL (ref 0.0–0.5)
Eosinophils Relative: 1 %
HCT: 37.6 % (ref 36.0–46.0)
Hemoglobin: 12 g/dL (ref 12.0–15.0)
Immature Granulocytes: 0 %
Lymphocytes Relative: 8 %
Lymphs Abs: 0.8 10*3/uL (ref 0.7–4.0)
MCH: 32.1 pg (ref 26.0–34.0)
MCHC: 31.9 g/dL (ref 30.0–36.0)
MCV: 100.5 fL — ABNORMAL HIGH (ref 80.0–100.0)
Monocytes Absolute: 1 10*3/uL (ref 0.1–1.0)
Monocytes Relative: 10 %
Neutro Abs: 8.1 10*3/uL — ABNORMAL HIGH (ref 1.7–7.7)
Neutrophils Relative %: 81 %
Platelets: 74 10*3/uL — ABNORMAL LOW (ref 150–400)
RBC: 3.74 MIL/uL — ABNORMAL LOW (ref 3.87–5.11)
RDW: 15.7 % — ABNORMAL HIGH (ref 11.5–15.5)
WBC: 10.1 10*3/uL (ref 4.0–10.5)
nRBC: 0.7 % — ABNORMAL HIGH (ref 0.0–0.2)

## 2021-03-12 LAB — RENAL FUNCTION PANEL
Albumin: 2.5 g/dL — ABNORMAL LOW (ref 3.5–5.0)
Albumin: 2.3 g/dL — ABNORMAL LOW (ref 3.5–5.0)
Anion gap: 11 (ref 5–15)
Anion gap: 10 (ref 5–15)
BUN: 29 mg/dL — ABNORMAL HIGH (ref 6–20)
BUN: 29 mg/dL — ABNORMAL HIGH (ref 6–20)
CO2: 26 mmol/L (ref 22–32)
CO2: 26 mmol/L (ref 22–32)
Calcium: 8.9 mg/dL (ref 8.9–10.3)
Calcium: 8.8 mg/dL — ABNORMAL LOW (ref 8.9–10.3)
Chloride: 98 mmol/L (ref 98–111)
Chloride: 99 mmol/L (ref 98–111)
Creatinine, Ser: 1.28 mg/dL — ABNORMAL HIGH (ref 0.44–1.00)
Creatinine, Ser: 1.2 mg/dL — ABNORMAL HIGH (ref 0.44–1.00)
GFR, Estimated: 49 mL/min — ABNORMAL LOW (ref >60)
GFR, Estimated: 53 mL/min — ABNORMAL LOW (ref >60)
Glucose, Bld: 128 mg/dL — ABNORMAL HIGH (ref 70–99)
Glucose, Bld: 153 mg/dL — ABNORMAL HIGH (ref 70–99)
Phosphorus: 1.9 mg/dL — ABNORMAL LOW (ref 2.5–4.6)
Phosphorus: 3.2 mg/dL (ref 2.5–4.6)
Potassium: 4.7 mmol/L (ref 3.5–5.1)
Potassium: 4.6 mmol/L (ref 3.5–5.1)
Sodium: 135 mmol/L (ref 135–145)
Sodium: 135 mmol/L (ref 135–145)

## 2021-03-12 LAB — CULTURE, BLOOD (ROUTINE X 2)
Culture: NO GROWTH
Culture: NO GROWTH
Special Requests: ADEQUATE
Special Requests: ADEQUATE

## 2021-03-12 LAB — MAGNESIUM: Magnesium: 2.8 mg/dL — ABNORMAL HIGH (ref 1.7–2.4)

## 2021-03-12 LAB — GLUCOSE, CAPILLARY
Glucose-Capillary: 109 mg/dL — ABNORMAL HIGH (ref 70–99)
Glucose-Capillary: 113 mg/dL — ABNORMAL HIGH (ref 70–99)
Glucose-Capillary: 119 mg/dL — ABNORMAL HIGH (ref 70–99)
Glucose-Capillary: 123 mg/dL — ABNORMAL HIGH (ref 70–99)
Glucose-Capillary: 126 mg/dL — ABNORMAL HIGH (ref 70–99)
Glucose-Capillary: 94 mg/dL (ref 70–99)

## 2021-03-12 MED ORDER — SODIUM PHOSPHATES 45 MMOLE/15ML IV SOLN
20.0000 mmol | Freq: Once | INTRAVENOUS | Status: AC
  Administered 2021-03-12: 20 mmol via INTRAVENOUS
  Filled 2021-03-12: qty 6.67

## 2021-03-12 MED FILL — Medication: Qty: 1 | Status: AC

## 2021-03-12 NOTE — Plan of Care (Signed)
  Problem: Education: Goal: Knowledge of General Education information will improve Description: Including pain rating scale, medication(s)/side effects and non-pharmacologic comfort measures 03/12/2021 1943 by Laverda Sorenson, Othelia Pulling, RN Outcome: Not Progressing 03/12/2021 1942 by Laverda Sorenson, Othelia Pulling, RN Outcome: Progressing   Problem: Health Behavior/Discharge Planning: Goal: Ability to manage health-related needs will improve 03/12/2021 1943 by Laverda Sorenson, Othelia Pulling, RN Outcome: Not Progressing 03/12/2021 1942 by Laverda Sorenson, Othelia Pulling, RN Outcome: Progressing   Problem: Clinical Measurements: Goal: Ability to maintain clinical measurements within normal limits will improve 03/12/2021 1943 by Laverda Sorenson, Othelia Pulling, RN Outcome: Not Progressing 03/12/2021 1942 by Laverda Sorenson, Othelia Pulling, RN Outcome: Progressing

## 2021-03-12 NOTE — Progress Notes (Addendum)
Subjective: Seizure overnight per critical care team.   ROS: unable to obtain due to poor mental status  Examination  Vital signs in last 24 hours: Temp:  [97.5 F (36.4 C)-99.3 F (37.4 C)] 98.4 F (36.9 C) (06/15 1115) Pulse Rate:  [77-96] 86 (06/15 1218) Resp:  [17-24] 19 (06/15 1218) BP: (93-113)/(47-80) 102/65 (06/15 1218) SpO2:  [87 %-100 %] 99 % (06/15 1218) Arterial Line BP: (81-131)/(42-66) 111/56 (06/15 1200) FiO2 (%):  [30 %-40 %] 30 % (06/15 1218) Weight:  [72.1 kg] 72.1 kg (06/15 0300)  General: lying in bed, not in apparent distress CVS: pulse-normal rate and rhythm RS: Intubated, coarse breath sounds bilaterally Extremities: Bilateral lower extremity edema, warm Neuro:Comatose, does not open eyes to noxious stimuli, does not follow commands, PERRLA, no gaze deviation, corneal reflex intact, gag reflex intact,  withdraws to noxious stimuli in all extremities    Basic Metabolic Panel: Recent Labs  Lab 03/09/21 1539 03/10/21 0202 03/10/21 1609 03/11/21 0446 03/11/21 1742 03/12/21 0450  NA 135 135 136 134* 136 135  K 3.4* 3.5 4.0 5.2* 4.7 4.7  CL 102 100 100 100 100 98  CO2 24 26 26 25 26 26   GLUCOSE 115* 130* 126* 145* 130* 128*  BUN 20 21* 22* 26* 34* 29*  CREATININE 1.56* 1.45* 1.26* 1.26* 1.61* 1.28*  CALCIUM 8.7* 8.6* 8.7* 8.7* 8.6* 8.9  MG 2.6* 2.7* 2.6* 2.6*  --  2.8*  PHOS 2.1*  2.1* 1.5* 1.5* 1.1* 1.4* 1.9*    CBC: Recent Labs  Lab 02/26/2021 1754 03/25/2021 2230 03/08/21 0338 03/08/21 0405 03/09/21 0204 03/09/21 0336 03/10/21 0202 03/11/21 0446 03/12/21 0450  WBC 13.4*   < > 6.2  --  18.4*  --  12.6* 12.8* 10.1  NEUTROABS 9.9*  --   --   --   --   --   --   --  8.1*  HGB 11.2*   < > 12.0   < > 12.3 11.9* 11.7* 12.6 12.0  HCT 35.9*   < > 32.5*   < > 36.5 35.0* 36.2 39.2 37.6  MCV 103.8*   < > 100.6*  --  96.6  --  98.1 99.7 100.5*  PLT 110*   < > 100*  --  98*  --  84* 80* 74*   < > = values in this interval not displayed.      Coagulation Studies: No results for input(s): LABPROT, INR in the last 72 hours.  Imaging MRI brain without contrast 03/11/2021: Mild signal abnormality in the basal ganglia bilaterally suggesting hypoxic-ischemic injury. Mild chronic small vessel ischemic disease with small chronic infarcts as above    ASSESSMENT AND PLAN: 55 year old female status post cardiac arrest, 30 minutes before ROSC was achieved.   Cardiac arrest Suspected anoxic/hypoxic brain injury -LTM EEG showed generalized periodic discharges ( GPD) with overriding fast activity, maximal posterior quadrant at 1-1.5Hz .    Recommendation -Continue Keppra 1000 mg twice daily.  Can increase to 1500 mg twice daily if any further seizures. -Continue Versed at 83ml/hr, can increase if any clinical seizure-like activity -Seizure precautions -As needed IV Versed 5 mg for clinical seizure-like activity -I suspect patient has suffered more diffuse anoxic/hypoxic injury than noted in MRI.  She does have brainstem reflexes and withdraws to noxious stimuli but has multiple medical comorbidities.  Also prolonged downtime, myoclonic seizures in the first 24 hours is suggestive of severe neurologic injury and portends poor prognosis for meaningful neurologic recovery.  -Continue goals of  care conversation with family -Discussed my plan with Dr. Tamala Julian.  Neurology will be available peripherally if needed. -Management of rest of comorbidities per primary  I have spent a total of  25 minutes with the patient reviewing hospital notes,  test results, labs and examining the patient as well as establishing an assessment and plan.  > 50% of time was spent in direct patient care    [facility] For questions after 5pm please refer to AMION to reach the Neurologist on call

## 2021-03-12 NOTE — Progress Notes (Signed)
RT Note: Pt placed on CPAP/PSV of 12/5 at 0746. Pt is tolerating well. RT will continue to monitor.

## 2021-03-12 NOTE — Progress Notes (Signed)
Met with family and discussed poor EEG findings and some anoxic injury on MRI combined make it unlikely we will have any meaningful recovery here.  They are still processing information and palliative is helping them through this.  Appreciate assistance.  Erskine Emery MD PCCM

## 2021-03-12 NOTE — Plan of Care (Signed)

## 2021-03-12 NOTE — Consult Note (Signed)
Consultation Note Date: 03/12/2021   Patient Name: Summer Cook  DOB: August 22, 1966  MRN: [id number]  Age / Sex: 55 y.o., female  PCP: Redmond School, MD Referring Physician: Candee Furbish, MD  Reason for Consultation: Establishing goals of care and Psychosocial/spiritual support  HPI/Patient Profile: 55 y.o. female   admitted on 03/02/2021 with  PMH significant  for Allergy, Asthma, CAD, CHF, ESRD (T/TH/[facility]), HTN, GERD, ITP, PE and stroke who presents sp Cardiac arrest. Arrested during dialysis. Was unresponsive requiring CPR for 30 minutes.   Transferred from [facility], currently medical ICU at [facility].  Patient continues with tonic-clonic seizures, on a Versed drip.  Low-dose Levophed, on CRRT.    Brain MRI without contrast on 03/11/2021-significant for mild signal abnormality in the basal ganglia bilaterally suggesting hypoxic ischemic injury.  Mild chronic small vessel ischemic disease with small chronic infarcts.  Per neurology this patient with prolonged downtime, myoclonic seizure in the first 24 hours is suggestive of severe neurologic injury and suggests poor prognosis for meaningful neurologic recovery.   Family face treatment option decisions, advanced directive decisions and anticipatory care needs.     Clinical Assessment and Goals of Care:  This NP Wadie Lessen reviewed medical records, received report from team, assessed the patient and then meet at the patient's bedside along with daughter/ Summer Cook and Erica's father/Summer Cook to discuss diagnosis, prognosis, GOC, EOL wishes disposition and options.  Dr. Tamala Julian initially met with daughter Summer Cook and her father updating them on current medical situation.  He addressed the questions and concerns.  I then stayed with family to continue processing the situation. Concept of Palliative Care was introduced as  specialized medical care for people and their families living with serious illness.  If focuses on providing relief from the symptoms and stress of a serious illness.  The goal is to improve quality of life for both the patient and the family.  Values and goals of care important to patient and family were attempted to be elicited.  Created space and opportunity for family to explore thoughts and feelings regarding current medical situation.  This is very difficult for patient's only daughter Summer Cook.  Summer Cook is grateful that her father Summer Cook is present with her, helping her navigate this difficult situation.  Both verbalized an understanding of the seriousness of the current medical situation and the associated long-term poor prognosis.  They understand the discussion and relevant decisions needing to be addressed regarding ongoing life prolonging measures.  Her family does not want to prolong the inevitable and are processing and looking at the logistics to liberate Summer Cook from life prolonging measures allowing a natural death.     A  discussion was had today regarding advanced directives.  Concepts specific to code status, artifical feeding and hydration, continued IV antibiotics and rehospitalization was had.    The difference between a aggressive medical intervention path  and a palliative comfort care path for this patient at this time was had.     Plan of  Care:  -No compression, no cardioversion -Continue with current medical interventions until daughter verbalizes a definitive time for one-way extubation and shift to a comfort path. (That time is likely tomorrow afternoon around 2:00- 3:00PM)  Sharen Hones will call me in the morning with a definite time -Family to gather includes patient's 73-year old granddaughter (this has been cleared by Therapist, sports) -comfort and dignity are a priority  Natural trajectory and expectations at EOL were discussed.  Education offered to family regarding  prognosis.  Unable to give specific time, however we did discuss a likely prognosis of hours to days.  Questions and concerns addressed.  Family encouraged to call with questions or concerns.     PMT will continue to support holistically.              No documented HPOA  Only daughter/ Summer Cook is next of kin.  She verbalizes that she does not want to be the sole per person making decisions regarding life prolonging measures,  and defers decision-making to the attending. She is heavily leaning on her father's strength and direction.       SUMMARY OF RECOMMENDATIONS    Code Status/Advance Care Planning: Limited code   Symptom Management:  Per attending  Palliative Prophylaxis:  Aspiration, Bowel Regimen, Eye Care, Frequent Pain Assessment, and Oral Care  Additional Recommendations (Limitations, Scope, Preferences): Full Scope Treatment  Psycho-social/Spiritual:  Desire for further Chaplaincy support:yes Additional Recommendations: Caregiving  Support/Resources and Grief/Bereavement Support  Prognosis:  Long term poor prognosis, will be impacted by decisions for life prolonging measures.  Discharge Planning: To Be Determined      Primary Diagnoses: Present on Admission:  Cardiac arrest [facility])   I have reviewed the medical record, interviewed the patient and family, and examined the patient. The following aspects are pertinent.  History reviewed. No pertinent past medical history. Social History   Socioeconomic History   Marital status: Divorced    Spouse name: Not on file   Number of children: Not on file   Years of education: Not on file   Highest education level: Not on file  Occupational History   Not on file  Tobacco Use   Smoking status: Former    Pack years: 0.00    Types: Cigarettes   Smokeless tobacco: Never  Substance and Sexual Activity   Alcohol use: Not on file   Drug use: Not on file   Sexual activity: Not on file  Other Topics  Concern   Not on file  Social History Narrative   Not on file   Social Determinants of Health   Financial Resource Strain: Not on file  Food Insecurity: Not on file  Transportation Needs: Not on file  Physical Activity: Not on file  Stress: Not on file  Social Connections: Not on file   History reviewed. No pertinent family history. Scheduled Meds:  chlorhexidine gluconate (MEDLINE KIT)  15 mL Mouth Rinse BID   Chlorhexidine Gluconate Cloth  6 each Topical Daily   docusate  100 mg Per Tube BID   heparin injection (subcutaneous)  5,000 Units Subcutaneous Q8H   mouth rinse  15 mL Mouth Rinse 10 times per day   midodrine  5 mg Oral TID WC   multivitamin  1 tablet Per Tube QHS   pantoprazole (PROTONIX) IV  40 mg Intravenous Daily   polyethylene glycol  17 g Per Tube Daily   sennosides  5 mL Per Tube Daily   sodium chloride flush  10-40  mL Intracatheter Q12H   Continuous Infusions:   prismasol BGK 4/2.5 400 mL/hr at 03/12/21 0601    prismasol BGK 4/2.5 200 mL/hr at 03/11/21 0930   sodium chloride Stopped (03/12/21 1124)   dextrose 10 mL/hr at 03/12/21 1200   feeding supplement (PIVOT 1.5 CAL) 1,000 mL (03/11/21 2115)   fentaNYL infusion INTRAVENOUS Stopped (03/12/21 0735)   heparin 10,000 units/ 20 mL infusion syringe 400 Units/hr (03/11/21 1905)   heparin 0 mL/hr at 03/10/21 0238   levETIRAcetam Stopped (03/12/21 0753)   midazolam 2 mg/hr (03/12/21 1200)   norepinephrine (LEVOPHED) Adult infusion 16 mcg/min (03/12/21 1200)   prismasol BGK 4/2.5 1,500 mL/hr at 03/12/21 1134   propofol (DIPRIVAN) infusion Stopped (03/08/21 1123)   sodium phosphate  Dextrose 5% IVPB 43 mL/hr at 03/12/21 1200   PRN Meds:.albuterol, heparin, heparin, heparin, LORazepam, sodium chloride flush Medications Prior to Admission:  Prior to Admission medications   Medication Sig Start Date End Date Taking? Authorizing Provider  calcium acetate (PHOSLO) 667 MG capsule Take 667-1,334 mg by mouth 3  (three) times daily with meals. 11/30/20  Yes [provider]  FLUoxetine (PROZAC) 20 MG capsule Take 40 mg by mouth daily. 02/20/21  Yes [provider]  furosemide (LASIX) 80 MG tablet Take 80 mg by mouth daily. 03/09/2021  Yes [provider]  hydrALAZINE (APRESOLINE) 25 MG tablet Take 25 mg by mouth 3 (three) times daily. 11/07/20  Yes [provider]  nitroGLYCERIN (NITROSTAT) 0.4 MG SL tablet Place 0.4 mg under the tongue every 5 (five) minutes as needed for chest pain. 02/22/21  Yes [provider]  Oxycodone HCl 10 MG TABS Take 10 mg by mouth every 4 (four) hours as needed. 02/14/21  Yes [provider]  zolpidem (AMBIEN) 5 MG tablet Take 5 mg by mouth at bedtime. 10/16/20  Yes [provider]   Allergies  Allergen Reactions   Bee Venom Swelling   Ciprofloxacin Nausea And Vomiting   Cymbalta [Duloxetine Hcl] Itching   Dilaudid [Hydromorphone]    Penicillins Nausea And Vomiting   Vortioxetine Itching   Ceftriaxone Rash   Review of Systems  Unable to perform ROS: Acuity of condition   Physical Exam Constitutional:      Appearance: She is underweight. She is ill-appearing.     Interventions: She is intubated.  Cardiovascular:     Rate and Rhythm: Normal rate.  Pulmonary:     Effort: She is intubated.  Skin:    General: Skin is warm and dry.    Vital Signs: BP 102/65   Pulse 85   Temp 98.4 F (36.9 C) (Oral)   Resp (!) 22   Ht _0  (1.676 m)   Wt 72.1 kg   SpO2 100%   BMI 25.66 kg/m  Pain Scale: CPOT   Pain Score: 0-No pain   SpO2: SpO2: 100 % O2 Device:SpO2: 100 % O2 Flow Rate: .   IO: Intake/output summary:  Intake/Output Summary (Last 24 hours) at 03/12/2021 1213 Last data filed at 03/12/2021 1200 Gross per 24 hour  Intake 3085.97 ml  Output 2527 ml  Net 558.97 ml    LBM: Last BM Date: 03/11/21 Baseline Weight: Weight: 77.1 kg Most recent weight: Weight: 72.1 kg     Palliative  Assessment/Data:   Discussed with Dr Tamala Julian and bedside RN  Time In: 1300 Time Out: 1500 Time Total: 120 minutes Greater than 50%  of this time was spent counseling and coordinating care related to the above  assessment and plan.  Signed by: Wadie Lessen, NP   Please contact Palliative Medicine Team phone at [phone] for questions and concerns.  For individual provider: See Shea Evans

## 2021-03-12 NOTE — Progress Notes (Signed)
NAME:  Summer Cook, MRN:  [id number], DOB:  1965/10/06, LOS: 5 ADMISSION DATE:  03/02/2021, CONSULTATION DATE:  03/02/2021 REFERRING MD:  Dr. Almyra Free, CHIEF COMPLAINT:   Cardiac arrest  Brief Narrative:  This is a 55 yo who presents sp Cardiac arrest. Arrested during dialysis. Was unresponsive requiring CPR for 30 minutes. 1 of EPI given by EMS. EMS placed king airway as well transitioned to ETT without need for RSI.   Pertinent  Medical History  Allergy Asthma  CAD,  CHF,  ESRD (T/TH/S [facility]),  HTN,  GERD ITP PE  stroke  Significant Hospital Events: Including procedures, antibiotic start and stop dates in addition to other pertinent events   6/10 Patient transferred from [facility] after suffering cardiac arrest at outpatient HD session. 30 mins of ACLS prior to EMS arrival. [facility] airway exchanged for ETT in ED with no RSI meds  6/11 Issues with oxygenation overnight required initiation of low-dose Levophed due to hypotension on CRRT.  Mildly hypothermic and hypoglycemic 6/12 Seen with new onset tonic clonic seizures afternoon of 6/11 once propofol weaned off. EEG started and neuro consulted   Interim History / Subjective:  Seizure last night. Remains intubated/sedated and on CRRT  Objective   Blood pressure 102/60, pulse 92, temperature 99.3 F (37.4 C), temperature source Axillary, resp. rate (!) 24, height 5\' 6"  (1.676 m), weight 72.1 kg, SpO2 99 %.    Vent Mode: PRVC FiO2 (%):  [30 %-40 %] 30 % Set Rate:  [24 bmp] 24 bmp Vt Set:  [470 mL] 470 mL PEEP:  [5 cmH20] 5 cmH20 Plateau Pressure:  [18 cmH20-21 cmH20] 21 cmH20   Intake/Output Summary (Last 24 hours) at 03/12/2021 0737 Last data filed at 03/12/2021 0700 Gross per 24 hour  Intake 3218.08 ml  Output 3133 ml  Net 85.08 ml    Filed Weights   03/10/21 0205 03/11/21 0400 03/12/21 0300  Weight: 78.4 kg 73 kg 72.1 kg    Examination: Constitutional: frail woman in NAD  Eyes: pupils are sluggish, equally  reactive to light Ears, nose, mouth, and throat: ETT in place, small thick secretions Cardiovascular: tachycardic, ext warm Respiratory: Diminished bases, no accessory muscle use Gastrointestinal: soft, +BS Skin: No rashes, normal turgor Neurologic: opens eyes to voice, does not track; oculocephalic intact, corneals intact, + cough/gag Psychiatric: cannot assess  Plts continue slowly downtrending   Labs/imaging that I have personally reviewed   CTA chest from 03/23/2021 Bilateral consolidation primarily in the lower lobes with associated diffuse pulmonary edema right greater than left similar to that seen on prior plain film. Large left pleural effusion. Cirrhosis. Multiple rib fractures  CT head  from 03/12/2021 Somewhat limited by patient motion artifact. Mild atrophic changes. Echo 6/12 40-50, biventricular dysfunction, no WMA MRI hypoxemic injury particularly in basal ganglia   Resolved Hospital Problem list   NA  Assessment & Plan:  Cardiac arrest- question intravascular depletion from diarrhea combined with poor cardiopulmonary reserve Circulatory shock preceding arrest Combined systolic / diastolic heart failure with volume overload- improved CAD Acute hypoxemic respiratory failure on vent- PNA vs. atypical pulmonary edema; CXR cleared rapidly so dc'd abx 6/13; mental status precludes extubation ESRD on TThS HD PTA Post arrest seizure and encephalopathy question anoxic Hypoglycemia- on low dose D10 drip Developing thrombocytopenia- low 4T score, just watch for now  - Vent support, VAP prevention bundle - Ongoing EEG. AEDs, gtts per neuro: versed being increased to reduce Starr meeting today at 1300; poor prognosis  but need to determine what she would have wanted  Best practice   Diet:  TF Pain/Anxiety/Delirium protocol (if indicated): titrated to EEG VAP protocol (if indicated): Yes DVT prophylaxis: Subcutaneous Heparin GI prophylaxis: H2B Glucose control:   D10 Central venous access:  Yes, and it is still needed Arterial line:  N/A Foley:  No Mobility:  bed rest  PT consulted: N/A Last date of multidisciplinary goals of care discussion: today Code Status:  full code.  Disposition: ICU     Patient critically ill due to respiratory failure, encephalopathy Interventions to address this today sedation and vent titration Risk of deterioration without these interventions is high  I personally spent 38 minutes providing critical care not including any separately billable procedures  Erskine Emery MD [facility]  Prefer epic messenger for cross cover needs If after hours, please call E-link  Update: CXR showing clearing of R infiltrate most c/w fluid; DC rocephin, monitor fever curve

## 2021-03-12 NOTE — Progress Notes (Signed)
[facility] Progress Note   Assessment/ Plan:   Dialysis Orders: [facility]: [facility]  on Mondays and Fridays. EDW 75.5 HD Bath 2K/2.5Ca  Time 210 minutes Heparin 1000 units bolus then 500 units/hr. Access L IJ TDC BFR 400 DFR 500      Assessment/Plan:  Cardiac arrest - PEA arrest requiring 30 minutes of CPR.  Presumably due to hypoxia.  Trend troponins per PCCM and keep MAPS >65 per PCCM.  TTE 03/09/21 with EF 45-50% and global hypokinesis Acute hypoxic respiratory failure - Vented, improved O2 requirement since UF  ESRD -  started on CRRT 03/03/2021.  Keeping even today d/t increased pressor requirement.  On heparin  Hypertension/volume   as above in #2  Anemia  - Hgb stable, no epo on outpatient orders  Metabolic bone disease -  hold meds for now Acute encephalopathy: Neuro following, on cEEG, MRI 03/11/21 with anoxic brain injury Thrombocytopenia: follow closely Dispo: in ICU, family meeting today  Subjective:    MRI yesterday showing anoxic brain injury.  Fam meetin today 1 pm    Objective:   BP 99/61   Pulse 89   Temp 99.3 F (37.4 C) (Axillary)   Resp (!) 23   Ht $R'5\' 6"'cM$  (1.676 m)   Wt 72.1 kg   SpO2 100%   BMI 25.66 kg/m   Physical Exam: Gen: intubated, NAD CVS: tachycardic Resp: coarse mechanical Abd: soft Ext: 1+ LE edema ACCESS: L IJ TDC, L AVF + T/B  Labs: BMET Recent Labs  Lab 03/09/21 0204 03/09/21 0336 03/09/21 1539 03/10/21 0202 03/10/21 1609 03/11/21 0446 03/11/21 1742 03/12/21 0450  NA 135 137 135 135 136 134* 136 135  K 3.4* 3.4* 3.4* 3.5 4.0 5.2* 4.7 4.7  CL 100  --  102 100 100 100 100 98  CO2 23  --  $R'24 26 26 25 26 26  'TB$ GLUCOSE 128*  --  115* 130* 126* 145* 130* 128*  BUN 18  --  20 21* 22* 26* 34* 29*  CREATININE 1.92*  --  1.56* 1.45* 1.26* 1.26* 1.61* 1.28*  CALCIUM 8.6*  --  8.7* 8.6* 8.7* 8.7* 8.6* 8.9  PHOS 2.5  --  2.1*  2.1* 1.5* 1.5* 1.1* 1.4* 1.9*   CBC Recent Labs  Lab 02/26/2021 1754 03/10/2021 2230  03/09/21 0204 03/09/21 0336 03/10/21 0202 03/11/21 0446 03/12/21 0450  WBC 13.4*   < > 18.4*  --  12.6* 12.8* 10.1  NEUTROABS 9.9*  --   --   --   --   --  8.1*  HGB 11.2*   < > 12.3 11.9* 11.7* 12.6 12.0  HCT 35.9*   < > 36.5 35.0* 36.2 39.2 37.6  MCV 103.8*   < > 96.6  --  98.1 99.7 100.5*  PLT 110*   < > 98*  --  84* 80* 74*   < > = values in this interval not displayed.      Medications:     chlorhexidine gluconate (MEDLINE KIT)  15 mL Mouth Rinse BID   Chlorhexidine Gluconate Cloth  6 each Topical Daily   docusate  100 mg Per Tube BID   heparin injection (subcutaneous)  5,000 Units Subcutaneous Q8H   mouth rinse  15 mL Mouth Rinse 10 times per day   midodrine  5 mg Oral TID WC   multivitamin  1 tablet Per Tube QHS   pantoprazole (PROTONIX) IV  40 mg Intravenous Daily   polyethylene glycol  17 g  Per Tube Daily   sennosides  5 mL Per Tube Daily   sodium chloride flush  10-40 mL Intracatheter Q12H     Madelon Lips MD 03/12/2021, 10:45 AM

## 2021-03-13 LAB — CBC WITH DIFFERENTIAL/PLATELET
Abs Immature Granulocytes: 0.11 10*3/uL — ABNORMAL HIGH (ref 0.00–0.07)
Basophils Absolute: 0 10*3/uL (ref 0.0–0.1)
Basophils Relative: 0 %
Eosinophils Absolute: 0.1 10*3/uL (ref 0.0–0.5)
Eosinophils Relative: 1 %
HCT: 38.9 % (ref 36.0–46.0)
Hemoglobin: 12.1 g/dL (ref 12.0–15.0)
Immature Granulocytes: 1 %
Lymphocytes Relative: 7 %
Lymphs Abs: 0.6 10*3/uL — ABNORMAL LOW (ref 0.7–4.0)
MCH: 31.5 pg (ref 26.0–34.0)
MCHC: 31.1 g/dL (ref 30.0–36.0)
MCV: 101.3 fL — ABNORMAL HIGH (ref 80.0–100.0)
Monocytes Absolute: 1.1 10*3/uL — ABNORMAL HIGH (ref 0.1–1.0)
Monocytes Relative: 13 %
Neutro Abs: 6.5 10*3/uL (ref 1.7–7.7)
Neutrophils Relative %: 78 %
Platelets: 44 10*3/uL — ABNORMAL LOW (ref 150–400)
RBC: 3.84 MIL/uL — ABNORMAL LOW (ref 3.87–5.11)
RDW: 15.9 % — ABNORMAL HIGH (ref 11.5–15.5)
WBC: 8.3 10*3/uL (ref 4.0–10.5)
nRBC: 1.6 % — ABNORMAL HIGH (ref 0.0–0.2)

## 2021-03-13 LAB — RENAL FUNCTION PANEL
Albumin: 2.4 g/dL — ABNORMAL LOW (ref 3.5–5.0)
Anion gap: 12 (ref 5–15)
BUN: 32 mg/dL — ABNORMAL HIGH (ref 6–20)
CO2: 26 mmol/L (ref 22–32)
Calcium: 9.1 mg/dL (ref 8.9–10.3)
Chloride: 99 mmol/L (ref 98–111)
Creatinine, Ser: 1.25 mg/dL — ABNORMAL HIGH (ref 0.44–1.00)
GFR, Estimated: 51 mL/min — ABNORMAL LOW (ref >60)
Glucose, Bld: 145 mg/dL — ABNORMAL HIGH (ref 70–99)
Phosphorus: 1.9 mg/dL — ABNORMAL LOW (ref 2.5–4.6)
Potassium: 4.8 mmol/L (ref 3.5–5.1)
Sodium: 137 mmol/L (ref 135–145)

## 2021-03-13 LAB — GLUCOSE, CAPILLARY
Glucose-Capillary: 131 mg/dL — ABNORMAL HIGH (ref 70–99)
Glucose-Capillary: 130 mg/dL — ABNORMAL HIGH (ref 70–99)
Glucose-Capillary: 136 mg/dL — ABNORMAL HIGH (ref 70–99)

## 2021-03-13 LAB — MAGNESIUM: Magnesium: 3 mg/dL — ABNORMAL HIGH (ref 1.7–2.4)

## 2021-03-13 LAB — APTT: aPTT: 62 seconds — ABNORMAL HIGH (ref 24–36)

## 2021-03-13 MED ORDER — MORPHINE SULFATE (PF) 2 MG/ML IV SOLN: 2.0000 mg | INTRAVENOUS | Status: DC | PRN

## 2021-03-13 MED ORDER — GLYCOPYRROLATE 0.2 MG/ML IJ SOLN: 0.2000 mg | INTRAMUSCULAR | Status: DC | PRN

## 2021-03-13 MED ORDER — DIPHENHYDRAMINE HCL 50 MG/ML IJ SOLN: 25.0000 mg | INTRAMUSCULAR | Status: DC | PRN

## 2021-03-13 MED ORDER — ACETAMINOPHEN 650 MG RE SUPP: 650.0000 mg | Freq: Four times a day (QID) | RECTAL | Status: DC | PRN

## 2021-03-13 MED ORDER — MORPHINE BOLUS VIA INFUSION
5.0000 mg | INTRAVENOUS | Status: DC | PRN
  Administered 2021-03-13 (×4): 5 mg via INTRAVENOUS
  Filled 2021-03-13: qty 5

## 2021-03-13 MED ORDER — HALOPERIDOL LACTATE 5 MG/ML IJ SOLN: 2.5000 mg | INTRAMUSCULAR | Status: DC | PRN

## 2021-03-13 MED ORDER — ACETAMINOPHEN 325 MG PO TABS: 650.0000 mg | ORAL_TABLET | Freq: Four times a day (QID) | ORAL | Status: DC | PRN

## 2021-03-13 MED ORDER — LORAZEPAM 2 MG/ML IJ SOLN
2.0000 mg | INTRAMUSCULAR | Status: DC | PRN
  Administered 2021-03-13: 4 mg via INTRAVENOUS
  Filled 2021-03-13: qty 2

## 2021-03-13 MED ORDER — DEXTROSE 5 % IV SOLN: INTRAVENOUS | Status: DC

## 2021-03-13 MED ORDER — SODIUM PHOSPHATES 45 MMOLE/15ML IV SOLN
30.0000 mmol | Freq: Once | INTRAVENOUS | Status: AC
  Administered 2021-03-13: 30 mmol via INTRAVENOUS
  Filled 2021-03-13: qty 10

## 2021-03-13 MED ORDER — MORPHINE 100MG IN NS 100ML (1MG/ML) PREMIX INFUSION
0.0000 mg/h | INTRAVENOUS | Status: DC
  Administered 2021-03-13: 5 mg/h via INTRAVENOUS
  Filled 2021-03-13: qty 100

## 2021-03-13 MED ORDER — POLYVINYL ALCOHOL 1.4 % OP SOLN: 1.0000 [drp] | Freq: Four times a day (QID) | OPHTHALMIC | Status: DC | PRN

## 2021-03-13 MED ORDER — GLYCOPYRROLATE 1 MG PO TABS: 1.0000 mg | ORAL_TABLET | ORAL | Status: DC | PRN

## 2021-03-14 ENCOUNTER — Encounter (HOSPITAL_COMMUNITY): Payer: Self-pay

## 2021-03-27 DIAGNOSIS — Z992 Dependence on renal dialysis: Secondary | ICD-10-CM | POA: Diagnosis not present

## 2021-03-27 DIAGNOSIS — N186 End stage renal disease: Secondary | ICD-10-CM | POA: Diagnosis not present

## 2021-03-28 NOTE — Progress Notes (Addendum)
This chaplain responded to PMT consult for EOL spiritual care before and after compassionate extubation.  The chaplain is appreciative of the RN-Cori's updates.  The Pt. daughter-Summer Cook is at the bedside along with the other family members.    The chaplain offered a listening presence as the family participated in story telling.  The chaplain assisted the family in making memories through hand prints of the Pt. and gathering a lock of the Pt. hair.  F/U spiritual care is available as needed.

## 2021-03-28 NOTE — Progress Notes (Signed)
[place] Progress Note Patient Name: Summer Cook DOB: August 12, 1966 MRN: [id number]   Date of Service  2021/04/08  HPI/Events of Note  Thrombocytopenia - Platelet count = 74 --> 44.  eICU Interventions  Hold Heparin [place] until the patient is re-evaluated by the rounding team.      Intervention Category Major Interventions: Other:  Lysle Dingwall 04/08/2021, 5:34 AM

## 2021-03-28 NOTE — Progress Notes (Signed)
  [facility] Progress Note   Assessment/ Plan:   Dialysis Orders: [facility]: [facility]  on Mondays and Fridays. EDW 75.5 HD Bath 2K/2.5Ca  Time 210 minutes Heparin 1000 units bolus then 500 units/hr. Access L IJ TDC BFR 400 DFR 500      Assessment/Plan:  Cardiac arrest - PEA arrest requiring 30 minutes of CPR.  Presumably due to hypoxia.  Trend troponins per PCCM and keep MAPS >65 per PCCM.  TTE 03/09/21 with EF 45-50% and global hypokinesis Acute hypoxic respiratory failure - Vented  ESRD -  started on CRRT 03/09/2021.  Keeping even.  Stopped heparin d/t plts of 44 this AM  Hypertension/volume   as above in #2  Anemia  - Hgb stable, no epo on outpatient orders  Metabolic bone disease -  hold meds for now Acute encephalopathy: Neuro following, on cEEG, MRI 03/11/21 with anoxic brain injury.   Thrombocytopenia Dispo: in ICU, transition to comfort today.  OK to stop CRRT at any time.  Order placed.  Subjective:    For transition to comfort care today when family arrives.  Aggressive care until then.  Seizures overnight x 2.   Objective:   BP 111/66   Pulse 79   Temp 98.1 F (36.7 C) (Axillary)   Resp (!) 25   Ht $R'5\' 6"'FS$  (1.676 m)   Wt 70.9 kg   SpO2 100%   BMI 25.23 kg/m   Physical Exam: Gen: intubated, NAD, comatose CVS: tachycardic Resp: coarse mechanical Abd: soft Ext: 1+ LE edema ACCESS: L IJ TDC, L AVF + T/B  Labs: BMET Recent Labs  Lab 03/10/21 0202 03/10/21 1609 03/11/21 0446 03/11/21 1742 03/12/21 0450 03/12/21 1625 2021-03-26 0329  NA 135 136 134* 136 135 135 137  K 3.5 4.0 5.2* 4.7 4.7 4.6 4.8  CL 100 100 100 100 98 99 99  CO2 $Re'26 26 25 26 26 26 26  'bpK$ GLUCOSE 130* 126* 145* 130* 128* 153* 145*  BUN 21* 22* 26* 34* 29* 29* 32*  CREATININE 1.45* 1.26* 1.26* 1.61* 1.28* 1.20* 1.25*  CALCIUM 8.6* 8.7* 8.7* 8.6* 8.9 8.8* 9.1  PHOS 1.5* 1.5* 1.1* 1.4* 1.9* 3.2 1.9*   CBC Recent Labs  Lab 03/22/2021 1754 03/08/2021 2230 03/10/21 0202  03/11/21 0446 03/12/21 0450 Mar 26, 2021 0342  WBC 13.4*   < > 12.6* 12.8* 10.1 8.3  NEUTROABS 9.9*  --   --   --  8.1* 6.5  HGB 11.2*   < > 11.7* 12.6 12.0 12.1  HCT 35.9*   < > 36.2 39.2 37.6 38.9  MCV 103.8*   < > 98.1 99.7 100.5* 101.3*  PLT 110*   < > 84* 80* 74* 44*   < > = values in this interval not displayed.      Medications:     chlorhexidine gluconate (MEDLINE KIT)  15 mL Mouth Rinse BID   Chlorhexidine Gluconate Cloth  6 each Topical Daily   docusate  100 mg Per Tube BID   mouth rinse  15 mL Mouth Rinse 10 times per day   midodrine  5 mg Oral TID WC   multivitamin  1 tablet Per Tube QHS   pantoprazole (PROTONIX) IV  40 mg Intravenous Daily   polyethylene glycol  17 g Per Tube Daily   sennosides  5 mL Per Tube Daily   sodium chloride flush  10-40 mL Intracatheter Q12H     Madelon Lips MD 2021/03/26, 9:15 AM

## 2021-03-28 NOTE — Death Summary Note (Signed)
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 Arrested during dialysis. Was unresponsive requiring CPR for 30 minutes. 1 of EPI given by EMS. EMS placed king airway as well transitioned to ETT without need for RSI.  She was kept on usual normothermia protocol but unfortunately developed severe anoxic status epilepticus.  This malignant EEG pattern for multiple days persisted and MRI confirmed some anoxic brain injury particularly in the basal ganglia.  In light of her prognosis, family decided to allow her to pass in peace.   Pertinent Labs and Studies  Significant Diagnostic Studies DG Chest 1 View  Result Date: 03/10/2021 CLINICAL DATA:  Pneumonia EXAM: CHEST  1 VIEW COMPARISON:  March 11, 2021 FINDINGS: Endotracheal tube remains in good position. NG tube coiled in the body the stomach. Right jugular central venous catheter tip at the cavoatrial junction unchanged. Significant improvement in extensive airspace disease right greater than left. There remains bibasilar airspace disease and small bilateral effusions. No pneumothorax. IMPRESSION: Significant improvement in airspace consolidation since the prior study There remains bibasilar atelectasis and small effusions, left greater than right. Electronically Signed   By: Franchot Gallo M.D.    On: 03/10/2021 07:32   CT Head Wo Contrast  Result Date: 03-11-21 CLINICAL DATA:  Recent CPR and unresponsive state, initial encounter EXAM: CT HEAD WITHOUT CONTRAST TECHNIQUE: Contiguous axial images were obtained from the base of the skull through the vertex without intravenous contrast. COMPARISON:  None. FINDINGS: Brain: No evidence of acute infarction, hemorrhage, hydrocephalus, extra-axial collection or mass lesion/mass effect. Mild atrophic changes are noted. Vascular: No hyperdense vessel or unexpected calcification. Skull: Normal. Negative for fracture or focal lesion. Sinuses/Orbits: Mild air-fluid levels are noted within the maxillary and sphenoid sinuses bilaterally. Other: None IMPRESSION: Somewhat limited by patient motion artifact. Mild atrophic changes. No acute intracranial abnormality is noted. Electronically Signed   By: Inez Catalina M.D.   On: 2021/03/11 19:51   CT Angio Chest PE W and/or Wo Contrast  Result Date: 03/11/2021 CLINICAL DATA:  Status post CPR, evaluate for pulmonary embolism EXAM: CT ANGIOGRAPHY CHEST WITH CONTRAST TECHNIQUE: Multidetector CT imaging of the chest was performed using the standard protocol during bolus administration of intravenous contrast. Multiplanar CT image reconstructions and MIPs were obtained to evaluate the vascular anatomy. CONTRAST:  190mL OMNIPAQUE IOHEXOL 350 MG/ML SOLN COMPARISON:  Chest x-ray from earlier in the same day, CT from 01/06/2019. FINDINGS: Cardiovascular: Thoracic aorta demonstrates a normal branching pattern. No aneurysmal dilatation or dissection is seen. Heart is enlarged in size. Pulmonary artery is well visualized with a normal branching pattern. Mild motion artifact somewhat limits the distal branches although no pulmonary emboli are identified. No significant coronary calcifications are noted. Left jugular dialysis catheter is noted in satisfactory position. Mediastinum/Nodes: Thoracic inlet is within normal limits.  Endotracheal tube and gastric catheter are noted in satisfactory position. The esophagus as visualized is within normal  limits. No sizable hilar or mediastinal adenopathy is noted. Lungs/Pleura: Lungs demonstrate diffuse multifocal consolidation worst in the bases bilaterally. Large left pleural effusion is noted. Parenchymal edema is identified right greater than left similar to that seen on recent plain film examination. No pneumothorax is noted. Upper Abdomen: Visualized upper abdomen demonstrates some nodularity of the liver suggestive of underlying cirrhosis. Remainder of the upper abdomen is within normal limits. Musculoskeletal: Motion artifact somewhat limits evaluation although there are a few anterior rib fractures identified on the right involving the second, third, fourth and fifth ribs. Similar angulated rib fractures are noted anteriorly on the left. Stable T10 compression deformity is noted unchanged from 2020. Review of the MIP images confirms the above findings. IMPRESSION: Bilateral consolidation primarily in the lower lobes with associated diffuse pulmonary edema right greater than left similar to that seen on prior plain film. Large left pleural effusion. No evidence of pulmonary emboli. Changes consistent with cirrhosis of the liver. Tubes and lines in satisfactory position. Multiple rib fractures related to the known CPR. Electronically Signed   By: Inez Catalina M.D.   On: 03/19/2021 19:49   MR BRAIN WO CONTRAST  Result Date: 03/11/2021 CLINICAL DATA:  Status post cardiac arrest. EXAM: MRI HEAD WITHOUT CONTRAST TECHNIQUE: Multiplanar, multiecho pulse sequences of the brain and surrounding structures were obtained without intravenous contrast. COMPARISON:  Head CT 03/25/2021 and MRI 12/16/2015 FINDINGS: Brain: There is subtle T2 and diffusion weighted signal hyperintensity in the basal ganglia bilaterally, slightly more conspicuous on the right than the left. No definite cortical restricted  diffusion or swelling is seen. There are small chronic cortical infarcts in the right frontal lobe and left frontoparietal region. Scattered small T2 hyperintensities elsewhere in the cerebral white matter bilaterally are nonspecific but compatible with mild chronic small vessel ischemic disease with a chronic lacunar infarct noted in the left corona radiata. A focus of chronic hemorrhage in the left cerebellar tonsil is unchanged from the prior MRI. No acute intracranial hemorrhage, mass, midline shift, or extra-axial fluid collection is identified. The ventricles are normal in size. Vascular: Major intracranial vascular flow voids are preserved. Skull and upper cervical spine: Unremarkable bone marrow signal. Sinuses/Orbits: Bilateral cataract extraction. Mild left greater than right ethmoid sinus mucosal thickening. Trace bilateral mastoid fluid. Other: None. IMPRESSION: 1. Mild signal abnormality in the basal ganglia bilaterally suggesting hypoxic-ischemic injury. 2. Mild chronic small vessel ischemic disease with small chronic infarcts as above. Electronically Signed   By: Logan Bores M.D.   On: 03/11/2021 16:33   DG CHEST PORT 1 VIEW  Result Date: 03/16/2021 CLINICAL DATA:  Status post intubation EXAM: PORTABLE CHEST 1 VIEW COMPARISON:  Film from earlier in the same day. FINDINGS: Endotracheal tube is again identified in satisfactory position. Gastric catheter extends into the stomach. Dialysis catheter is again seen and stable. Persistent bilateral airspace opacities are noted right considerably greater than left similar to that seen on prior exam as well as the recent chest CT. The known rib fractures are not well appreciated on this exam. IMPRESSION: Relatively stable appearance of the chest when compared with the prior study. Electronically Signed   By: Inez Catalina M.D.   On: 02/28/2021 22:54   DG Chest Port 1 View  Result Date: 03/21/2021 CLINICAL DATA:  Status post cardiac arrest and CPR EXAM:  PORTABLE CHEST 1 VIEW COMPARISON:  06/21/2020 FINDINGS: Endotracheal tube is noted in satisfactory position. Gastric catheter is noted coiled within the stomach. Left jugular dialysis catheter is  noted in satisfactory position. Cardiac shadow is enlarged. Small left pleural effusion is noted improved from the prior exam. Significant opacity is noted within both lungs right greater than left likely related to pulmonary edema given recent CPR. No pneumothorax is noted. No discrete rib fractures are seen. IMPRESSION: Tubes and lines as described in satisfactory position. Diffuse airspace disease throughout the right lung and to a lesser degree in the left lung likely related to edema given the recent CPR. Electronically Signed   By: Inez Catalina M.D.   On: 03/16/2021 17:59   Overnight EEG with video  Result Date: 03/09/2021 Lora Havens, MD     03/10/2021  9:27 AM Patient Name: Lielle Vandervort MRN: [id number] Epilepsy Attending: Lora Havens Referring Physician/Provider: Dr Donnetta Simpers Duration: 03/08/2021 2124 to 03/09/2021 2124 Patient history: 55yo F s/p cardiac arrest. EEG to evaluate for seizure Level of alertness: comatose AEDs during EEG study: LEV Technical aspects: Part of this EEG was obtained using a 10 lead EEG system positioned circumferentially without any parasagittal coverage (rapid EEG). Computer selected EEG is reviewed as well as background features and all clinically significant events. After 1433 on 03/09/2021, this EEG study was done with scalp electrodes positioned according to the 10-20 International system of electrode placement. Electrical activity was acquired at a sampling rate of 500Hz  and reviewed with a high frequency filter of 70Hz  and a low frequency filter of 1Hz . EEG data were recorded continuously and digitally stored.  Description: EEG showed continuous generalized 3 to 6 Hz theta-delta slowing. Generalized periodic discharges with overriding fast activity maximal  posterior quadrant at  1-1.5Hz  were also noted which at times appeared rhythmic without definite evolution. Hyperventilation and photic stimulation were not performed.   ABNORMALITY - Periodic discharges, generalized and maximal posterior quadrant( GPD+) - Continuous slow, generalized IMPRESSION: This study showed evidence of epileptogenicity with generalized onset, maximal in posterior quadrants. The periodic discharges are on the ictal-interictal continuum with high potential for seizures. There was also severe diffuse encephalopathy, nonspecific etiology but in a patient with cardiac arrest is likely related to  anoxic/hypoxic brain injury. No seizures were seen throughout the recording. Lora Havens   ECHOCARDIOGRAM COMPLETE  Result Date: 03/09/2021    ECHOCARDIOGRAM REPORT   Patient Name:   MASEN SALVAS Date of Exam: 03/09/2021 Medical Rec #:  [id number]     Height:       66.0 in Accession #:    [id number]    Weight:       176.4 lb Date of Birth:  1966-06-23     BSA:          1.896 m Patient Age:    70 years      BP:           96/64 mmHg Patient Gender: F             HR:           67 bpm. Exam Location:  Inpatient Procedure: 2D Echo, Cardiac Doppler and Color Doppler Indications:    Cardiac arrest 427.5  History:        Patient has prior history of Echocardiogram examinations, most                 recent 07/27/2019. Signs/Symptoms:Shortness of Breath. On                 continuous dialysis throughout the echocardiogram.  Sonographer:    [place] Referring Phys: [id number] PAULA B  SIMPSON IMPRESSIONS  1. Left ventricular ejection fraction, by estimation, is 45 to 50%. The left ventricle has mildly decreased function. The left ventricle demonstrates global hypokinesis. The left ventricular internal cavity size was mildly dilated. Left ventricular diastolic function could not be evaluated.  2. Right ventricular systolic function is normal. The right ventricular size is moderately enlarged. There is  moderately elevated pulmonary artery systolic pressure. The estimated right ventricular systolic pressure is 09.8 mmHg.  3. Right atrial size was severely dilated.  4. The mitral valve is normal in structure. Moderate mitral valve regurgitation. No evidence of mitral stenosis.  5. Tricuspid valve regurgitation is mild to moderate.  6. The aortic valve is normal in structure. Aortic valve regurgitation is not visualized. No aortic stenosis is present.  7. Aortic dilatation noted. There is borderline dilatation of the ascending aorta, measuring 37 mm.  8. The inferior vena cava is normal in size with <50% respiratory variability, suggesting right atrial pressure of 8 mmHg. FINDINGS  Left Ventricle: Left ventricular ejection fraction, by estimation, is 45 to 50%. The left ventricle has mildly decreased function. The left ventricle demonstrates global hypokinesis. The left ventricular internal cavity size was mildly dilated. There is  no left ventricular hypertrophy. Abnormal (paradoxical) septal motion, consistent with left bundle branch block. Left ventricular diastolic function could not be evaluated due to abnormal septal motion. Left ventricular diastolic function could not be evaluated. Right Ventricle: The right ventricular size is moderately enlarged. No increase in right ventricular wall thickness. Right ventricular systolic function is normal. There is moderately elevated pulmonary artery systolic pressure. The tricuspid regurgitant  velocity is 3.33 m/s, and with an assumed right atrial pressure of 8 mmHg, the estimated right ventricular systolic pressure is 11.9 mmHg. Left Atrium: Left atrial size was normal in size. Right Atrium: Right atrial size was severely dilated. Pericardium: There is no evidence of pericardial effusion. Mitral Valve: The mitral valve is normal in structure. Moderate mitral valve regurgitation. No evidence of mitral valve stenosis. Tricuspid Valve: The tricuspid valve is normal in  structure. Tricuspid valve regurgitation is mild to moderate. No evidence of tricuspid stenosis. Aortic Valve: The aortic valve is normal in structure. Aortic valve regurgitation is not visualized. No aortic stenosis is present. Aortic valve mean gradient measures 13.0 mmHg. Aortic valve peak gradient measures 21.7 mmHg. Aortic valve area, by VTI measures 1.52 cm. Pulmonic Valve: The pulmonic valve was normal in structure. Pulmonic valve regurgitation is not visualized. No evidence of pulmonic stenosis. Aorta: Aortic dilatation noted. There is borderline dilatation of the ascending aorta, measuring 37 mm. Venous: The inferior vena cava is normal in size with less than 50% respiratory variability, suggesting right atrial pressure of 8 mmHg. IAS/Shunts: No atrial level shunt detected by color flow Doppler.  LEFT VENTRICLE PLAX 2D LVIDd:         5.80 cm      Diastology LVIDs:         4.50 cm      LV e' medial:    7.40 cm/s LV PW:         0.90 cm      LV E/e' medial:  15.8 LV IVS:        1.00 cm      LV e' lateral:   13.60 cm/s LVOT diam:     2.00 cm      LV E/e' lateral: 8.6 LV SV:         55 LV SV Index:   29  LVOT Area:     3.14 cm  LV Volumes (MOD) LV vol d, MOD A2C: 219.0 ml LV vol d, MOD A4C: 198.0 ml LV vol s, MOD A2C: 96.0 ml LV vol s, MOD A4C: 96.7 ml LV SV MOD A2C:     123.0 ml LV SV MOD A4C:     198.0 ml LV SV MOD BP:      115.3 ml RIGHT VENTRICLE          IVC RV Basal diam:  5.00 cm  IVC diam: 1.60 cm RV Mid diam:    3.80 cm LEFT ATRIUM             Index       RIGHT ATRIUM           Index LA diam:        4.20 cm 2.22 cm/m  RA Area:     26.00 cm LA Vol (A2C):   69.5 ml 36.66 ml/m RA Volume:   89.80 ml  47.37 ml/m LA Vol (A4C):   57.3 ml 30.23 ml/m LA Biplane Vol: 64.4 ml 33.97 ml/m  AORTIC VALVE AV Area (Vmax):    1.58 cm AV Area (Vmean):   1.39 cm AV Area (VTI):     1.52 cm AV Vmax:           233.00 cm/s AV Vmean:          165.000 cm/s AV VTI:            0.361 m AV Peak Grad:      21.7 mmHg AV Mean  Grad:      13.0 mmHg LVOT Vmax:         117.00 cm/s LVOT Vmean:        72.800 cm/s LVOT VTI:          0.175 m LVOT/AV VTI ratio: 0.48  AORTA Ao Root diam: 3.00 cm Ao Asc diam:  3.70 cm MITRAL VALVE                TRICUSPID VALVE MV Area (PHT): 2.49 cm     TR Peak grad:   44.4 mmHg MV Decel Time: 305 msec     TR Vmax:        333.00 cm/s MV E velocity: 117.00 cm/s MV A velocity: 110.00 cm/s  SHUNTS MV E/A ratio:  1.06         Systemic VTI:  0.18 m                             Systemic Diam: 2.00 cm Fransico Him MD Electronically signed by Fransico Him MD Signature Date/Time: 03/09/2021/2:45:33 PM    Final     Microbiology Recent Results (from the past 240 hour(s))  Culture, blood (routine x 2)     Status: None   Collection Time: 03/27/2021  5:55 PM   Specimen: BLOOD LEFT HAND  Result Value Ref Range Status   Specimen Description BLOOD LEFT HAND  Final   Special Requests   Final    BOTTLES DRAWN AEROBIC AND ANAEROBIC Blood Culture adequate volume   Culture   Final    NO GROWTH 5 DAYS Performed at [facility], [address], [place], [place] [address]    Report Status 03/12/2021 FINAL  Final  Culture, blood (routine x 2)     Status: None   Collection Time: 03/06/2021  6:10 PM   Specimen: BLOOD RIGHT ARM  Result Value Ref Range Status   Specimen Description BLOOD RIGHT ARM  Final   Special Requests   Final    BOTTLES DRAWN AEROBIC ONLY Blood Culture adequate volume   Culture   Final    NO GROWTH 5 DAYS Performed at [facility], [address], [place], [place] [address]    Report Status 03/12/2021 FINAL  Final  Resp Panel by RT-PCR (Flu A&B, Covid) Nasopharyngeal Swab     Status: None   Collection Time: 03/19/2021  6:34 PM   Specimen: Nasopharyngeal Swab; Nasopharyngeal(NP) swabs in vial transport medium  Result Value Ref Range Status   SARS Coronavirus 2 by RT PCR NEGATIVE NEGATIVE Final    Comment: (NOTE) SARS-CoV-2 target nucleic acids are NOT DETECTED.  The SARS-CoV-2 RNA is generally  detectable in upper respiratory specimens during the acute phase of infection. The lowest concentration of SARS-CoV-2 viral copies this assay can detect is 138 copies/mL. A negative result does not preclude SARS-Cov-2 infection and should not be used as the sole basis for treatment or other patient management decisions. A negative result may occur with  improper specimen collection/handling, submission of specimen other than nasopharyngeal swab, presence of viral mutation(s) within the areas targeted by this assay, and inadequate number of viral copies(<138 copies/mL). A negative result must be combined with clinical observations, patient history, and epidemiological information. The expected result is Negative.  Fact Sheet for Patients:  EntrepreneurPulse.com.au  Fact Sheet for Healthcare Providers:  IncredibleEmployment.be  This test is no t yet approved or cleared by the [place] FDA and  has been authorized for detection and/or diagnosis of SARS-CoV-2 by FDA under an Emergency Use Authorization (EUA). This EUA will remain  in effect (meaning this test can be used) for the duration of the COVID-19 declaration under Section 564(b)(1) of the Act, 21 U.S.C.section 360bbb-3(b)(1), unless the authorization is terminated  or revoked sooner.       Influenza A by PCR NEGATIVE NEGATIVE Final   Influenza B by PCR NEGATIVE NEGATIVE Final    Comment: (NOTE) The Xpert Xpress SARS-CoV-2/FLU/RSV plus assay is intended as an aid in the diagnosis of influenza from Nasopharyngeal swab specimens and should not be used as a sole basis for treatment. Nasal washings and aspirates are unacceptable for Xpert Xpress SARS-CoV-2/FLU/RSV testing.  Fact Sheet for Patients: EntrepreneurPulse.com.au  Fact Sheet for Healthcare Providers: IncredibleEmployment.be  This test is not yet approved or cleared by the [place] FDA  and has been authorized for detection and/or diagnosis of SARS-CoV-2 by FDA under an Emergency Use Authorization (EUA). This EUA will remain in effect (meaning this test can be used) for the duration of the COVID-19 declaration under Section 564(b)(1) of the Act, 21 U.S.C. section 360bbb-3(b)(1), unless the authorization is terminated or revoked.  Performed at [facility], [address], [place], [place] [address]   MRSA PCR Screening     Status: None   Collection Time: 03/19/2021 10:08 PM   Specimen: Nasopharyngeal  Result Value Ref Range Status   MRSA by PCR NEGATIVE NEGATIVE Final    Comment:        The GeneXpert MRSA Assay (FDA approved for NASAL specimens only), is one component of a comprehensive MRSA colonization surveillance program. It is not intended to diagnose MRSA infection nor to guide or monitor treatment for MRSA infections. Performed at [facility], [place] [address], [place], [place] [address]   Culture, Respiratory w Gram Stain     Status: None  Collection Time: 03/22/2021 10:15 PM   Specimen: Tracheal Aspirate; Respiratory  Result Value Ref Range Status   Specimen Description TRACHEAL ASPIRATE  Final   Special Requests NONE  Final   Gram Stain   Final    RARE WBC PRESENT,BOTH PMN AND MONONUCLEAR NO ORGANISMS SEEN    Culture   Final    RARE Normal respiratory flora-no Staph aureus or Pseudomonas seen Performed at [facility], [address], [place], [place] [address]    Report Status 03/10/2021 FINAL  Final    Lab Basic Metabolic Panel: Recent Labs  Lab 03/10/21 0202 03/10/21 1609 03/11/21 0446 03/11/21 1742 03/12/21 0450 03/12/21 1625 03-20-21 0329  NA 135 136 134* 136 135 135 137  K 3.5 4.0 5.2* 4.7 4.7 4.6 4.8  CL 100 100 100 100 98 99 99  CO2 26 26 25 26 26 26 26   GLUCOSE 130* 126* 145* 130* 128* 153* 145*  BUN 21* 22* 26* 34* 29* 29* 32*  CREATININE 1.45* 1.26* 1.26* 1.61* 1.28* 1.20* 1.25*  CALCIUM 8.6* 8.7* 8.7* 8.6*  8.9 8.8* 9.1  MG 2.7* 2.6* 2.6*  --  2.8*  --  3.0*  PHOS 1.5* 1.5* 1.1* 1.4* 1.9* 3.2 1.9*   Liver Function Tests: Recent Labs  Lab 02/26/2021 1754 03/15/2021 2230 03/08/21 0338 03/08/21 1636 03/11/21 0446 03/11/21 1742 03/12/21 0450 03/12/21 1625 03-20-21 0329  AST 54* 74* 75*  --   --   --   --   --   --   ALT 28 29 26   --   --   --   --   --   --   ALKPHOS 99 82 70  --   --   --   --   --   --   BILITOT 1.0 1.5* 1.5*  --   --   --   --   --   --   PROT 5.8* 5.1* 5.0*  --   --   --   --   --   --   ALBUMIN 2.7* 2.4* 2.3*   < > 2.7* 2.4* 2.5* 2.3* 2.4*   < > = values in this interval not displayed.   No results for input(s): LIPASE, AMYLASE in the last 168 hours. No results for input(s): AMMONIA in the last 168 hours. CBC: Recent Labs  Lab 03/06/2021 1754 03/01/2021 2230 03/09/21 0204 03/09/21 0336 03/10/21 0202 03/11/21 0446 03/12/21 0450 Mar 20, 2021 0342  WBC 13.4*   < > 18.4*  --  12.6* 12.8* 10.1 8.3  NEUTROABS 9.9*  --   --   --   --   --  8.1* 6.5  HGB 11.2*   < > 12.3 11.9* 11.7* 12.6 12.0 12.1  HCT 35.9*   < > 36.5 35.0* 36.2 39.2 37.6 38.9  MCV 103.8*   < > 96.6  --  98.1 99.7 100.5* 101.3*  PLT 110*   < > 98*  --  84* 80* 74* 44*   < > = values in this interval not displayed.   Cardiac Enzymes: No results for input(s): CKTOTAL, CKMB, CKMBINDEX, TROPONINI in the last 168 hours. Sepsis Labs: Recent Labs  Lab 03/06/2021 1756 03/14/2021 1924 03/18/2021 2218 02/27/2021 2230 03/08/21 0338 03/09/21 0204 03/10/21 0202 03/11/21 0446 03/12/21 0450 March 20, 2021 0342  WBC  --   --   --    < > 6.2   < > 12.6* 12.8* 10.1 8.3  LATICACIDVEN 5.9* 3.5* 2.8*  --  3.0*  --   --   --   --   --    < > = values in this interval not displayed.    Candee Furbish 03/24/21, 5:15 PM

## 2021-03-28 NOTE — Progress Notes (Signed)
RT Note: Pt placed on PS/CPAP of 12/5. Pt is tolerating well. RT will continue to monitor.

## 2021-03-28 NOTE — Progress Notes (Signed)
NAME:  Summer Cook, MRN:  [id number], DOB:  [birth year], LOS: 6 ADMISSION DATE:  03/24/2021, CONSULTATION DATE:  03/12/2021 REFERRING MD:  Dr. Almyra Free, CHIEF COMPLAINT:   Cardiac arrest  Brief Narrative:  This is a 55 yo who presents sp Cardiac arrest. Arrested during dialysis. Was unresponsive requiring CPR for 30 minutes. 1 of EPI given by EMS. EMS placed king airway as well transitioned to ETT without need for RSI.   Pertinent  Medical History  Allergy Asthma  CAD,  CHF,  ESRD (T/TH/S [facility]),  HTN,  GERD ITP PE  stroke  Significant Hospital Events: Including procedures, antibiotic start and stop dates in addition to other pertinent events   6/10 Patient transferred from [facility] after suffering cardiac arrest at outpatient HD session. 30 mins of ACLS prior to EMS arrival. [facility] airway exchanged for ETT in ED with no RSI meds  6/11 Issues with oxygenation overnight required initiation of low-dose Levophed due to hypotension on CRRT.  Mildly hypothermic and hypoglycemic 6/12 Seen with new onset tonic clonic seizures afternoon of 6/11 once propofol weaned off. EEG started and neuro consulted   Interim History / Subjective:  Remains comatose. Plts dropping. Remains on CRRT and pressors. Seizures x 2 overnight controlled with PRNs.  Objective   Blood pressure 105/65, pulse 86, temperature 98.1 F (36.7 C), temperature source Axillary, resp. rate (!) 25, height 5\' 6"  (1.676 m), weight 70.9 kg, SpO2 98 %.    Vent Mode: PRVC FiO2 (%):  [30 %] 30 % Set Rate:  [24 bmp] 24 bmp Vt Set:  [470 mL] 470 mL PEEP:  [5 cmH20] 5 cmH20 Pressure Support:  [12 cmH20] 12 cmH20 Plateau Pressure:  [15 cmH20] 15 cmH20   Intake/Output Summary (Last 24 hours) at 03/16/2021 2229 Last data filed at 16-Mar-2021 7989 Gross per 24 hour  Intake 3045.52 ml  Output 3171 ml  Net -125.48 ml    Filed Weights   03/11/21 0400 03/12/21 0300 2021/03/16 0411  Weight: 73 kg 72.1 kg 70.9 kg     Examination: Constitutional: frail woman in NAD  Eyes: pupils are sluggish, equally reactive to light Ears, nose, mouth, and throat: ETT in place, small thick secretions Cardiovascular: tachycardic, ext warm Respiratory: Diminished bases, no accessory muscle use Gastrointestinal: soft, +BS Skin: No rashes, normal turgor Neurologic: does not open eyes for me, pupils briskly reactive, withdraws x 4, not following commands,  Psychiatric: cannot assess  Plts continue slowly downtrending Other labs stable  Labs/imaging that I have personally reviewed   CTA chest from 03/20/2021 Bilateral consolidation primarily in the lower lobes with associated diffuse pulmonary edema right greater than left similar to that seen on prior plain film. Large left pleural effusion. Cirrhosis. Multiple rib fractures  CT head  from 03/06/2021 Somewhat limited by patient motion artifact. Mild atrophic changes. Echo 6/12 40-50, biventricular dysfunction, no WMA MRI hypoxemic injury particularly in basal ganglia   Resolved Hospital Problem list   NA  Assessment & Plan:  Cardiac arrest- question intravascular depletion from diarrhea combined with poor cardiopulmonary reserve Circulatory shock preceding arrest Combined systolic / diastolic heart failure with volume overload- improved CAD Acute hypoxemic respiratory failure on vent- PNA vs. atypical pulmonary edema; CXR cleared rapidly so dc'd abx 6/13; mental status precludes extubation ESRD on TThS HD PTA Post arrest seizure and encephalopathy suspicion for profound anoxic injury out of proportion to MRI findings especially in light of multiple malignant EEG findings, prolonged downtime. Hypoglycemia- on low dose D10 drip  Worseing  thrombocytopenia- low 4T score, heparin is off  Family coming in today at [facility] to say goodbyes then likely transition to comfort Continue aggressive care in interim including vent support, pressors titrated to MAP 65, CRRT, and  tubefeeds Hold heparin  32 min cc time. Erskine Emery MD PCCM

## 2021-03-28 NOTE — Progress Notes (Signed)
Patient extubated per withdrawal of care orders per family request.  Patient tolerated well.  No complications noted.

## 2021-03-28 NOTE — Progress Notes (Signed)
Phosphorus 1.9 Electrolytes replaced per [facility] electrolyte replacement protocol

## 2021-03-28 DEATH — deceased

## 2021-05-12 ENCOUNTER — Ambulatory Visit (INDEPENDENT_AMBULATORY_CARE_PROVIDER_SITE_OTHER): Payer: Medicare Other | Admitting: Gastroenterology

## 2021-05-27 ENCOUNTER — Ambulatory Visit (INDEPENDENT_AMBULATORY_CARE_PROVIDER_SITE_OTHER): Payer: Medicare Other | Admitting: Internal Medicine

## 2021-07-15 IMAGING — DX DG CHEST 1V PORT
1 series · 1 of 1 positions shown · non-contrast
Comparison: Chest x-ray dated February 27, 2019.

CLINICAL DATA: Cough and shortness of breath. Missed dialysis fifth
ray.

EXAM:
PORTABLE CHEST 1 VIEW

[chest ap]
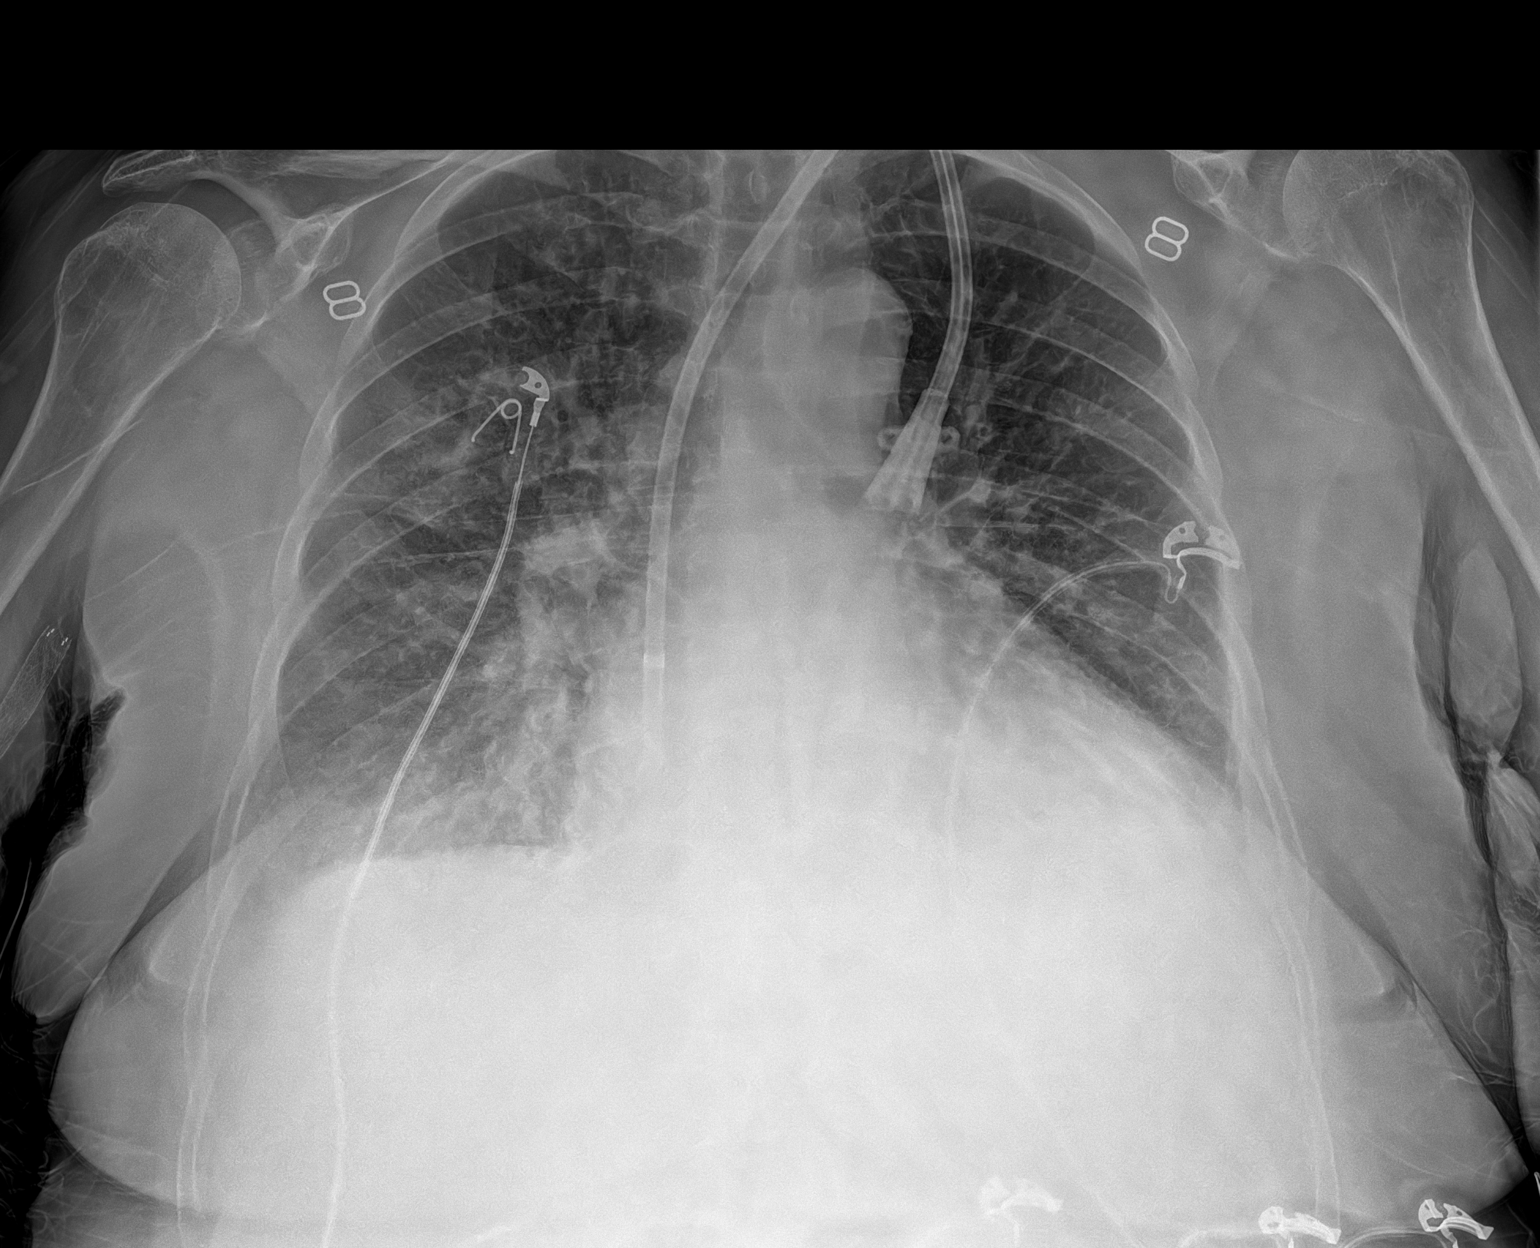

[1 of 1 positions shown; findings below may reference images not displayed]

FINDINGS: New tunneled left internal jugular dialysis catheter with interval
removal of the previously seen tunneled right internal jugular
dialysis catheter. Stable heart size at the upper limits of normal.
Diffuse interstitial thickening with mild patchy opacities in the
right upper lobe and both lung bases. Small bilateral pleural
effusions. No pneumothorax. No acute osseous abnormality.
IMPRESSION: 1. Mild pulmonary edema and small bilateral pleural effusions.

## 2021-08-29 IMAGING — DX DG CHEST 1V PORT
1 series · 1 of 1 positions shown · non-contrast
Comparison: 03/08/2020

CLINICAL DATA: 54-year-old with shortness of breath. End-stage
renal disease.

EXAM:
PORTABLE CHEST 1 VIEW

[chest ap]
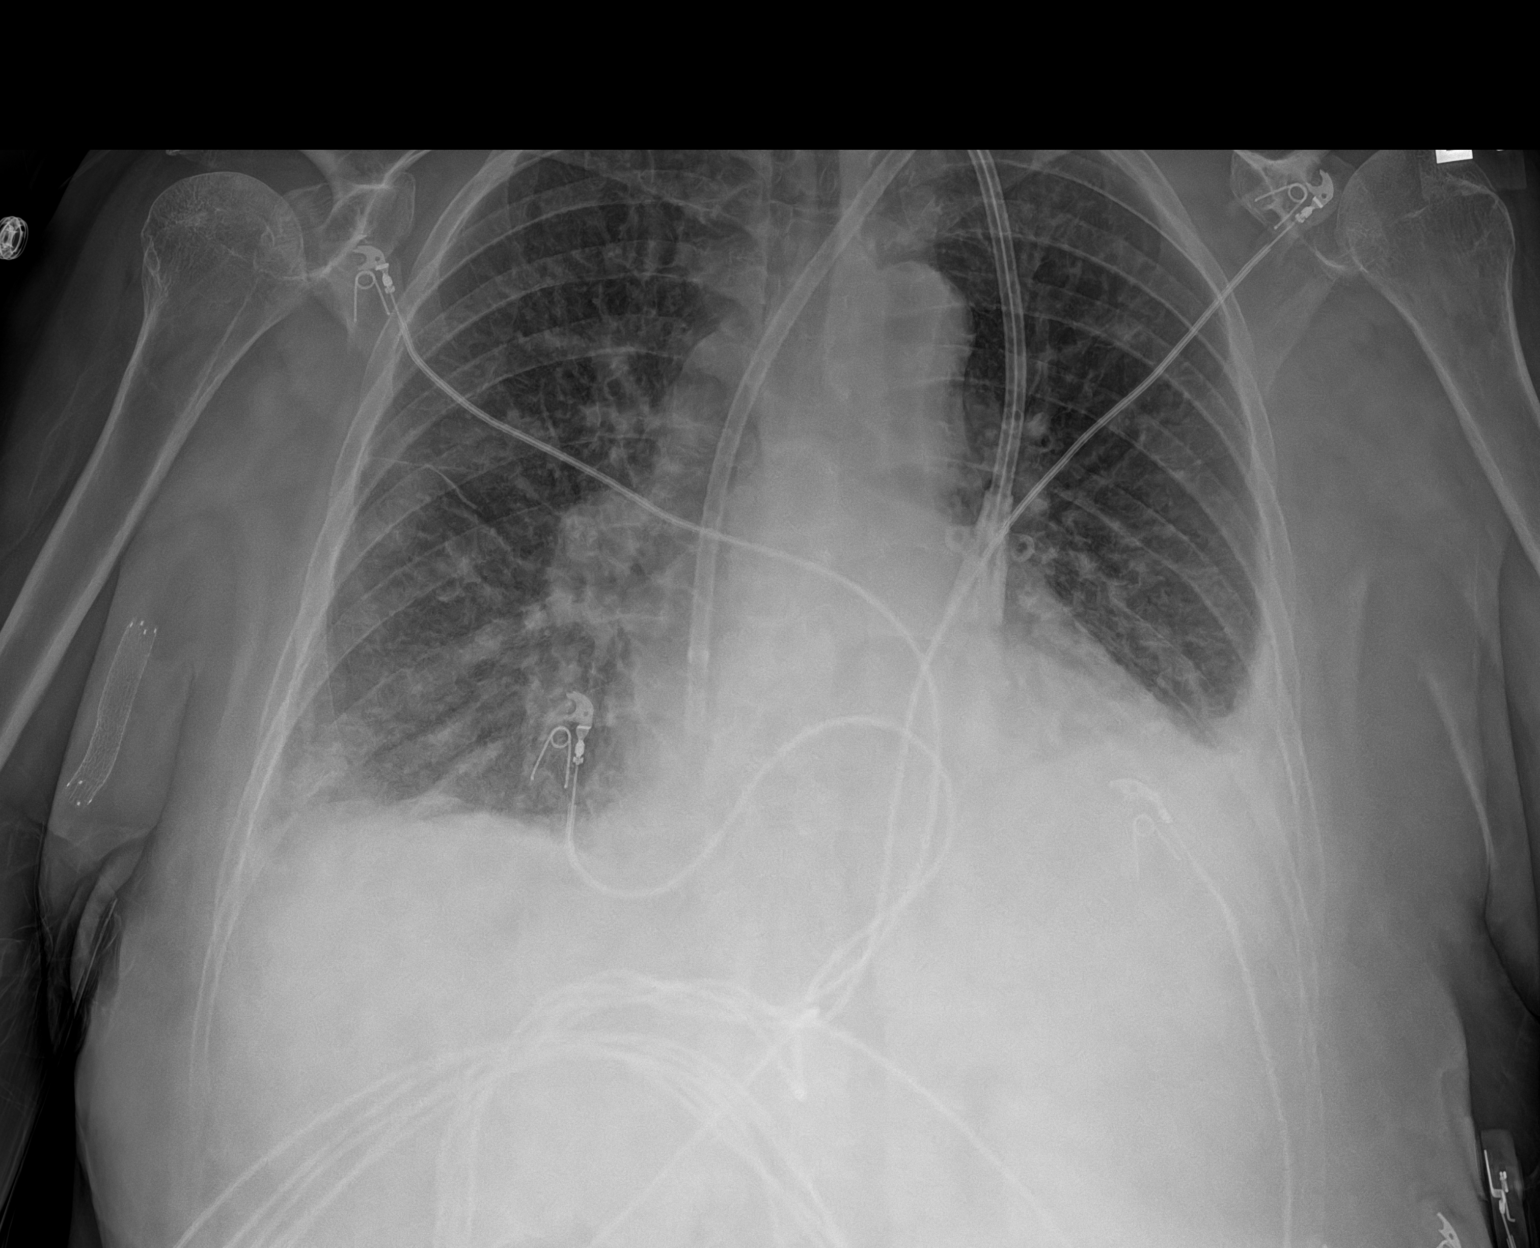

[1 of 1 positions shown; findings below may reference images not displayed]

FINDINGS: Stable appearance of the left jugular dialysis catheter with the tip
in the right atrium. Persistent densities at the left costophrenic
angle likely represent a small left pleural effusion. Some hazy and
patchy densities in the lateral right lung base have slightly
progressed. Slightly increased interstitial lung densities are
suggestive for mild edema. Heart size is within normal limits and
stable. Prominent azygos vein shadow. Negative for pneumothorax.
IMPRESSION: 1. Evidence for mild interstitial pulmonary edema.
2. Persistent basilar densities probably represent small pleural
effusions particularly on the left side. Hazy and indistinct
densities near the right costophrenic angle are indeterminate and
could represent a small focus of airspace disease or atelectasis.

## 2021-08-30 IMAGING — DX DG CHEST 2V
2 series · 2 of 2 positions shown · non-contrast
Comparison: 03/23/2020

CLINICAL DATA: Cough, pulmonary edema

EXAM:
CHEST - 2 VIEW

[chest pa]
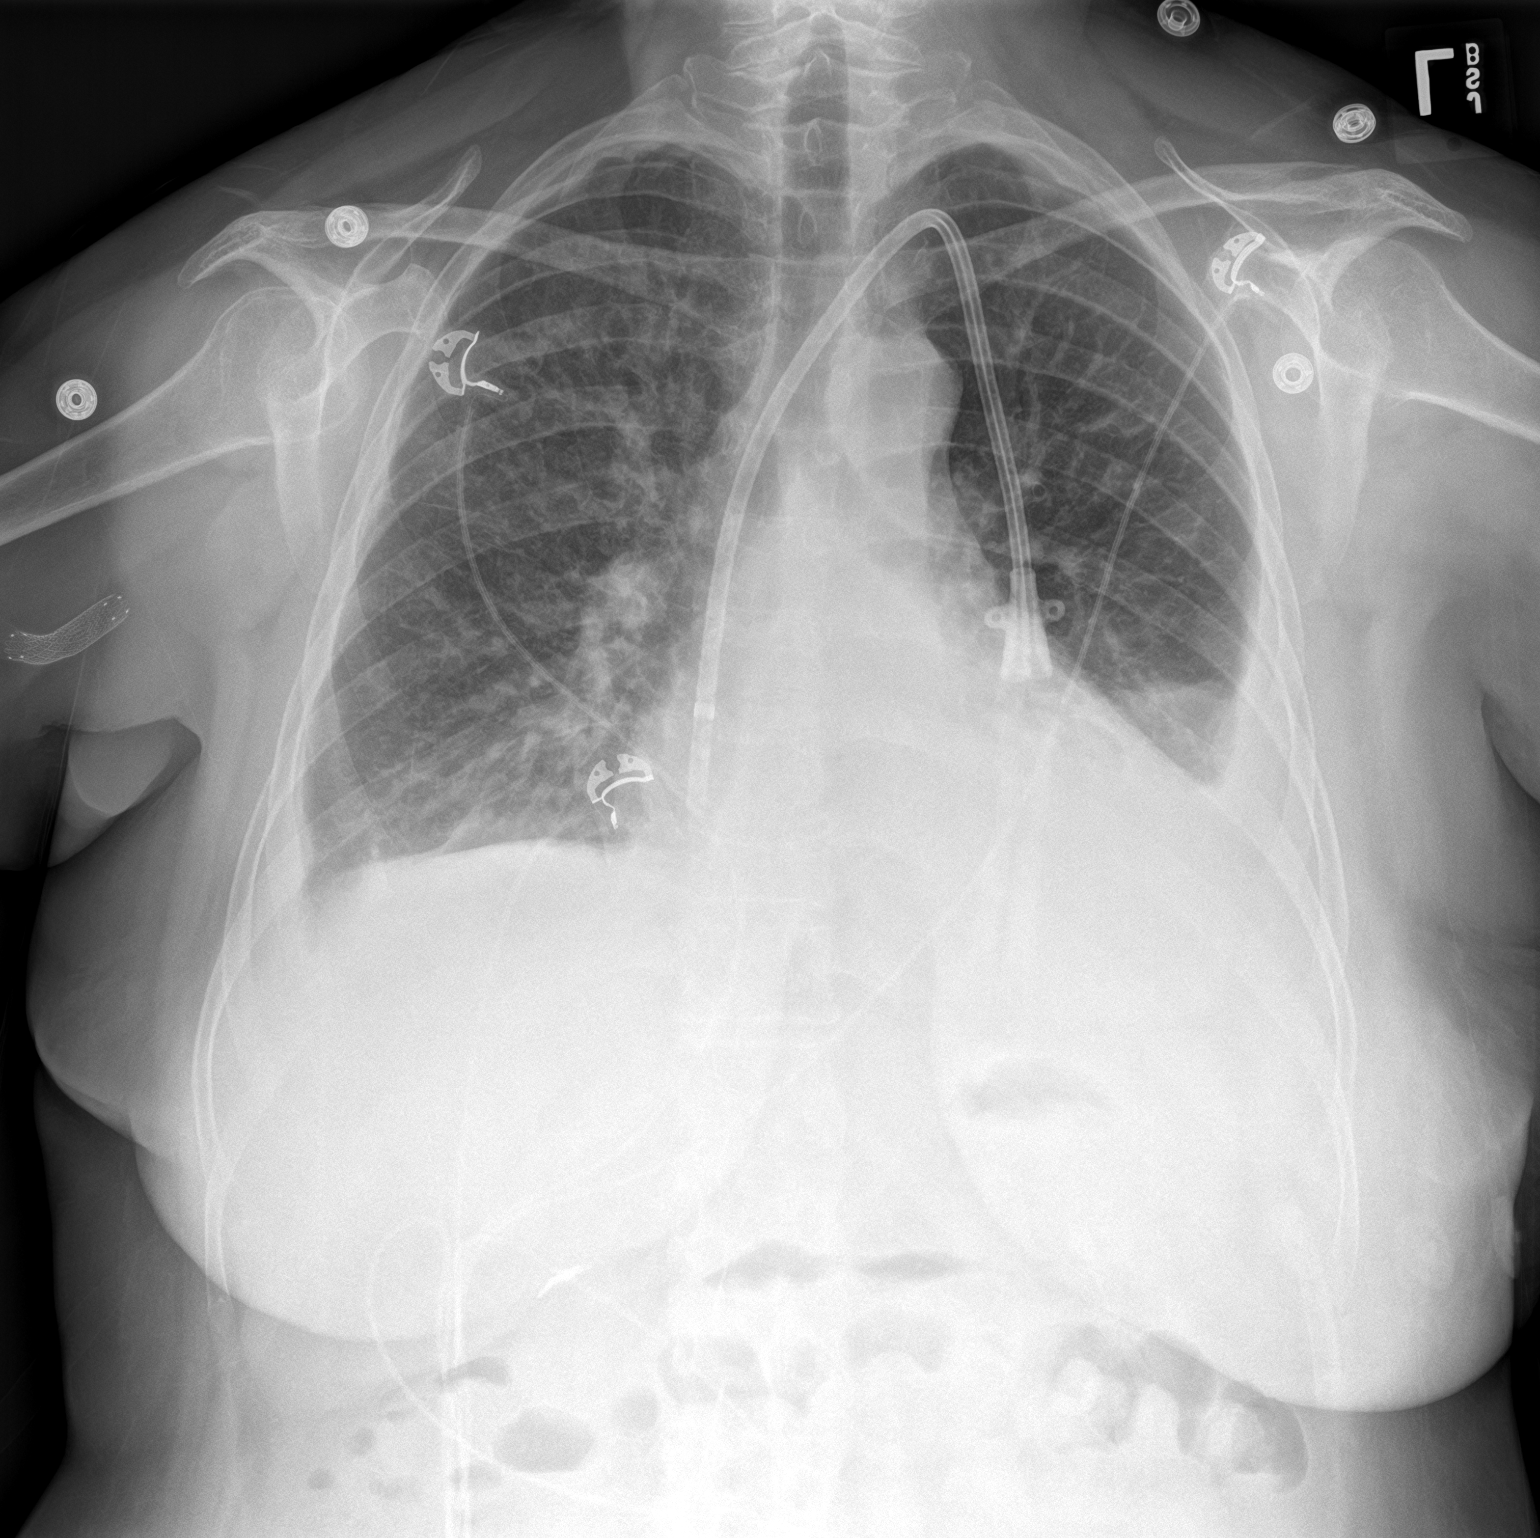

[chest lat]
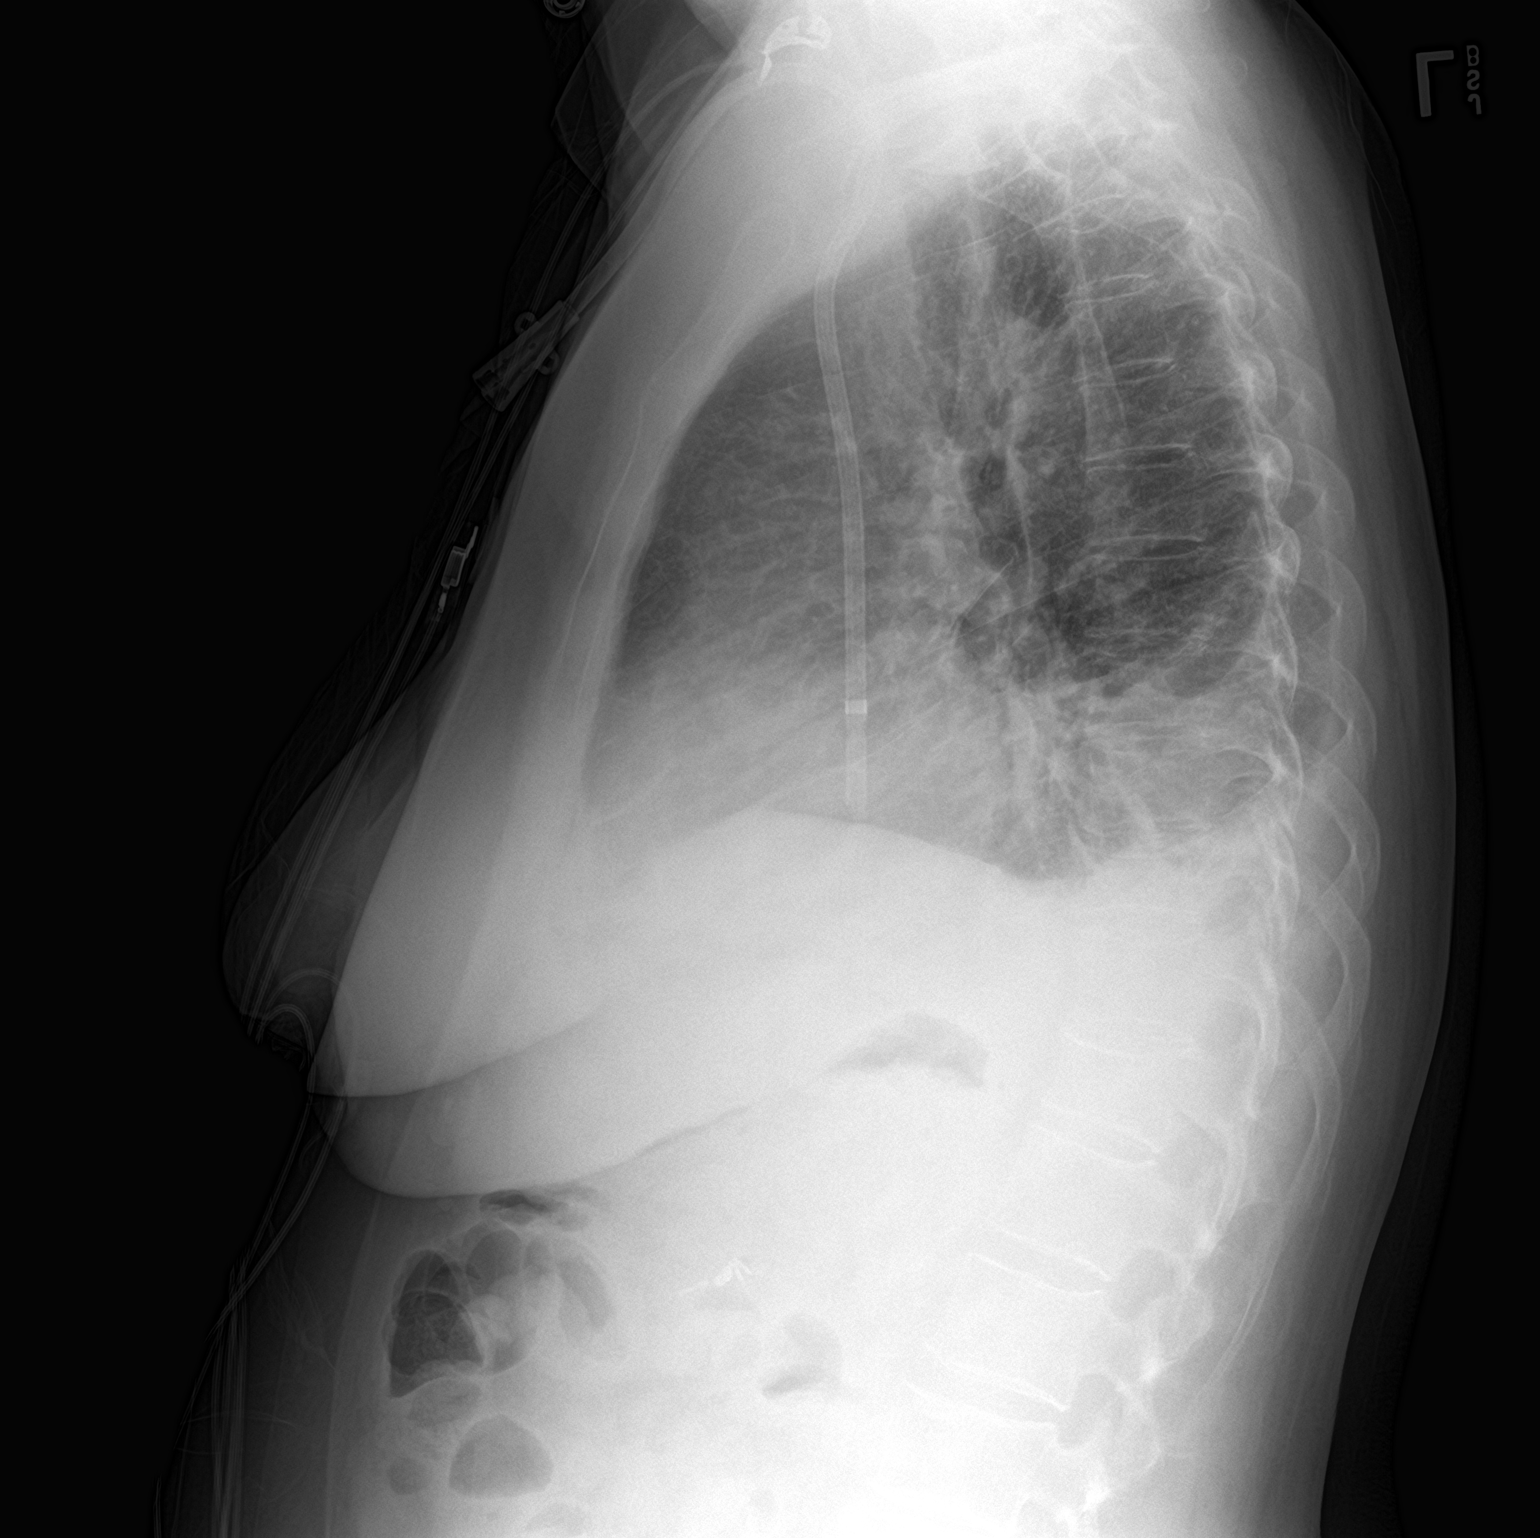

[2 of 2 positions shown; findings below may reference images not displayed]

FINDINGS: LEFT jugular catheter with tip projecting over superior RIGHT
atrium.

Enlargement of cardiac silhouette with slight vascular congestion.

Mediastinal contours normal.

Bibasilar effusions and atelectasis greater on LEFT.

Minimal infiltrate RIGHT upper lobe.

No pneumothorax or acute osseous findings.
IMPRESSION: Bibasilar effusions and atelectasis greater on LEFT.

Mild RIGHT upper lobe infiltrate.

## 2021-08-31 IMAGING — DX DG CHEST 1V PORT
1 series · 1 of 1 positions shown · non-contrast
Comparison: Yesterday

CLINICAL DATA: Acute pulmonary edema

EXAM:
PORTABLE CHEST 1 VIEW

[chest ap grid]
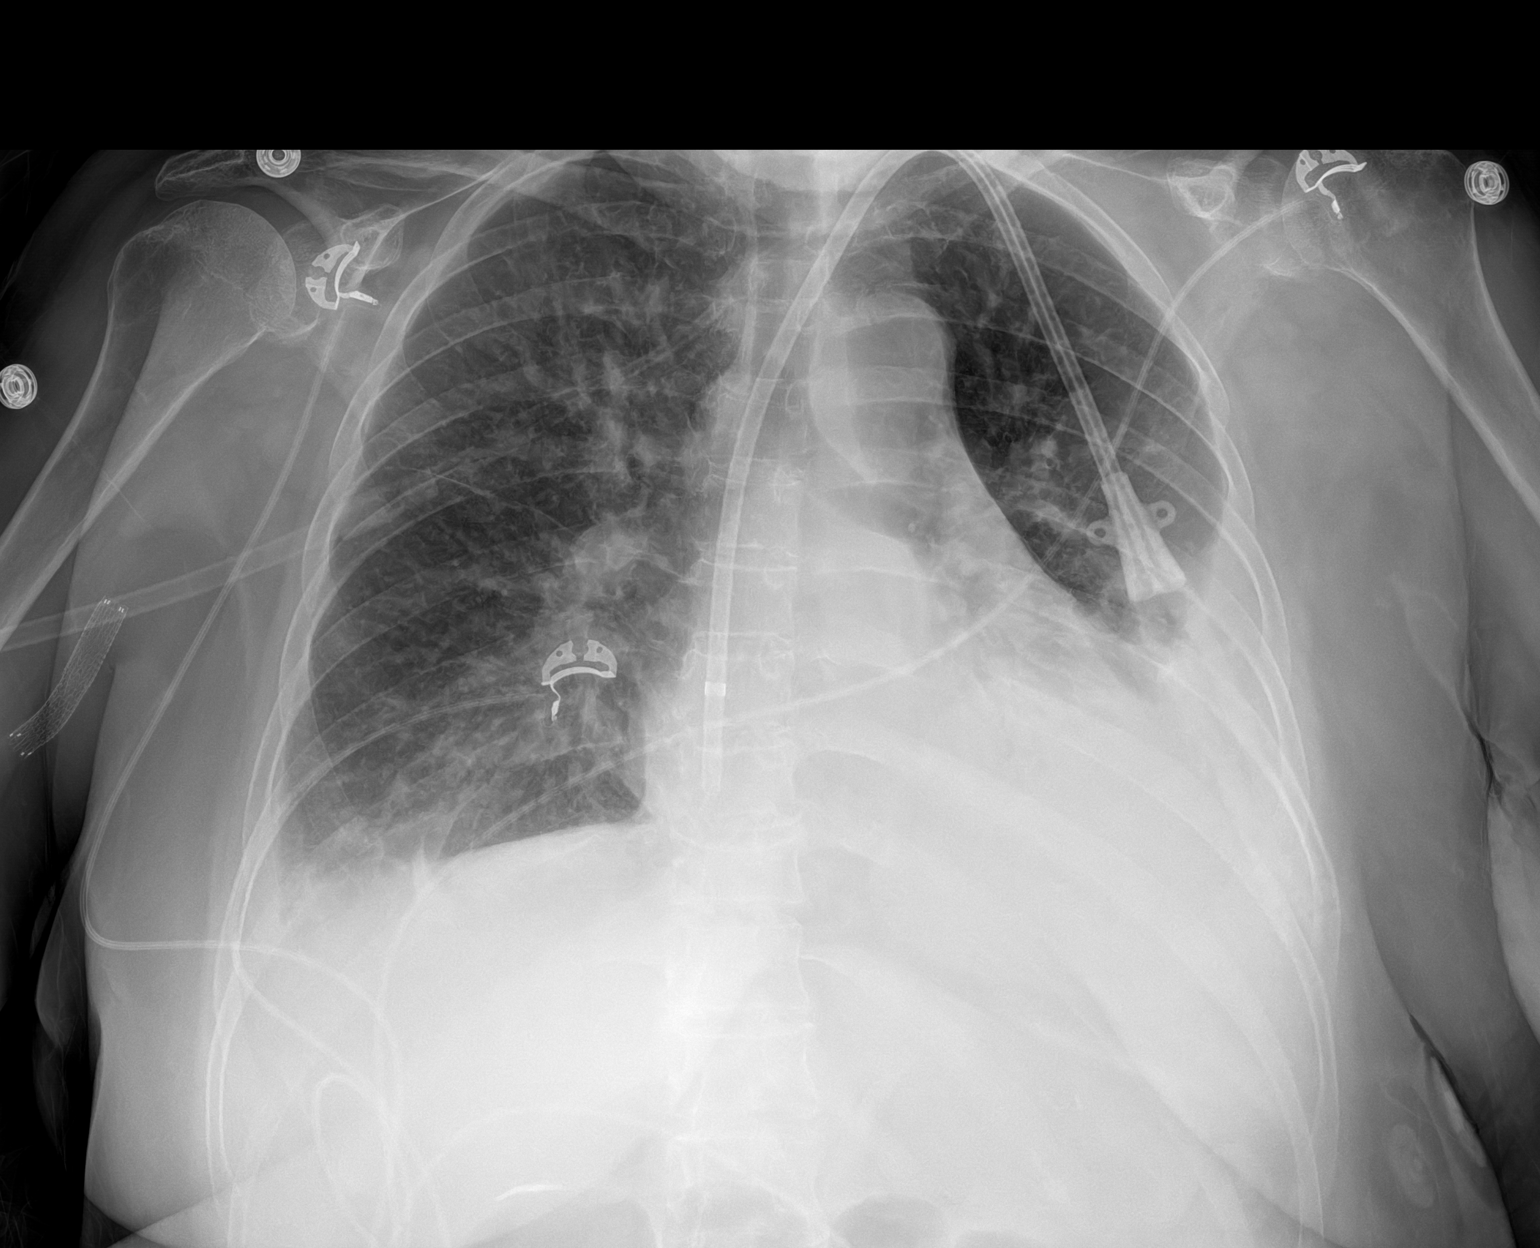

[1 of 1 positions shown; findings below may reference images not displayed]

FINDINGS: Dialysis catheter on the left with tip at the right atrium. Left
more than right pleural effusion, moderate on the left, with
underlying pulmonary opacity that could be atelectasis or
consolidation. No generalized Kerley lines or pneumothorax.
IMPRESSION: Unchanged left more than right pleural effusion with underlying
pulmonary opacity.

## 2021-11-27 IMAGING — CR DG CHEST 1V PORT
1 series · 1 of 1 positions shown · non-contrast
Comparison: 05/20/2020

CLINICAL DATA: Left IJ palindrome catheter insertion.

EXAM:
PORTABLE CHEST 1 VIEW

[x chest ap]
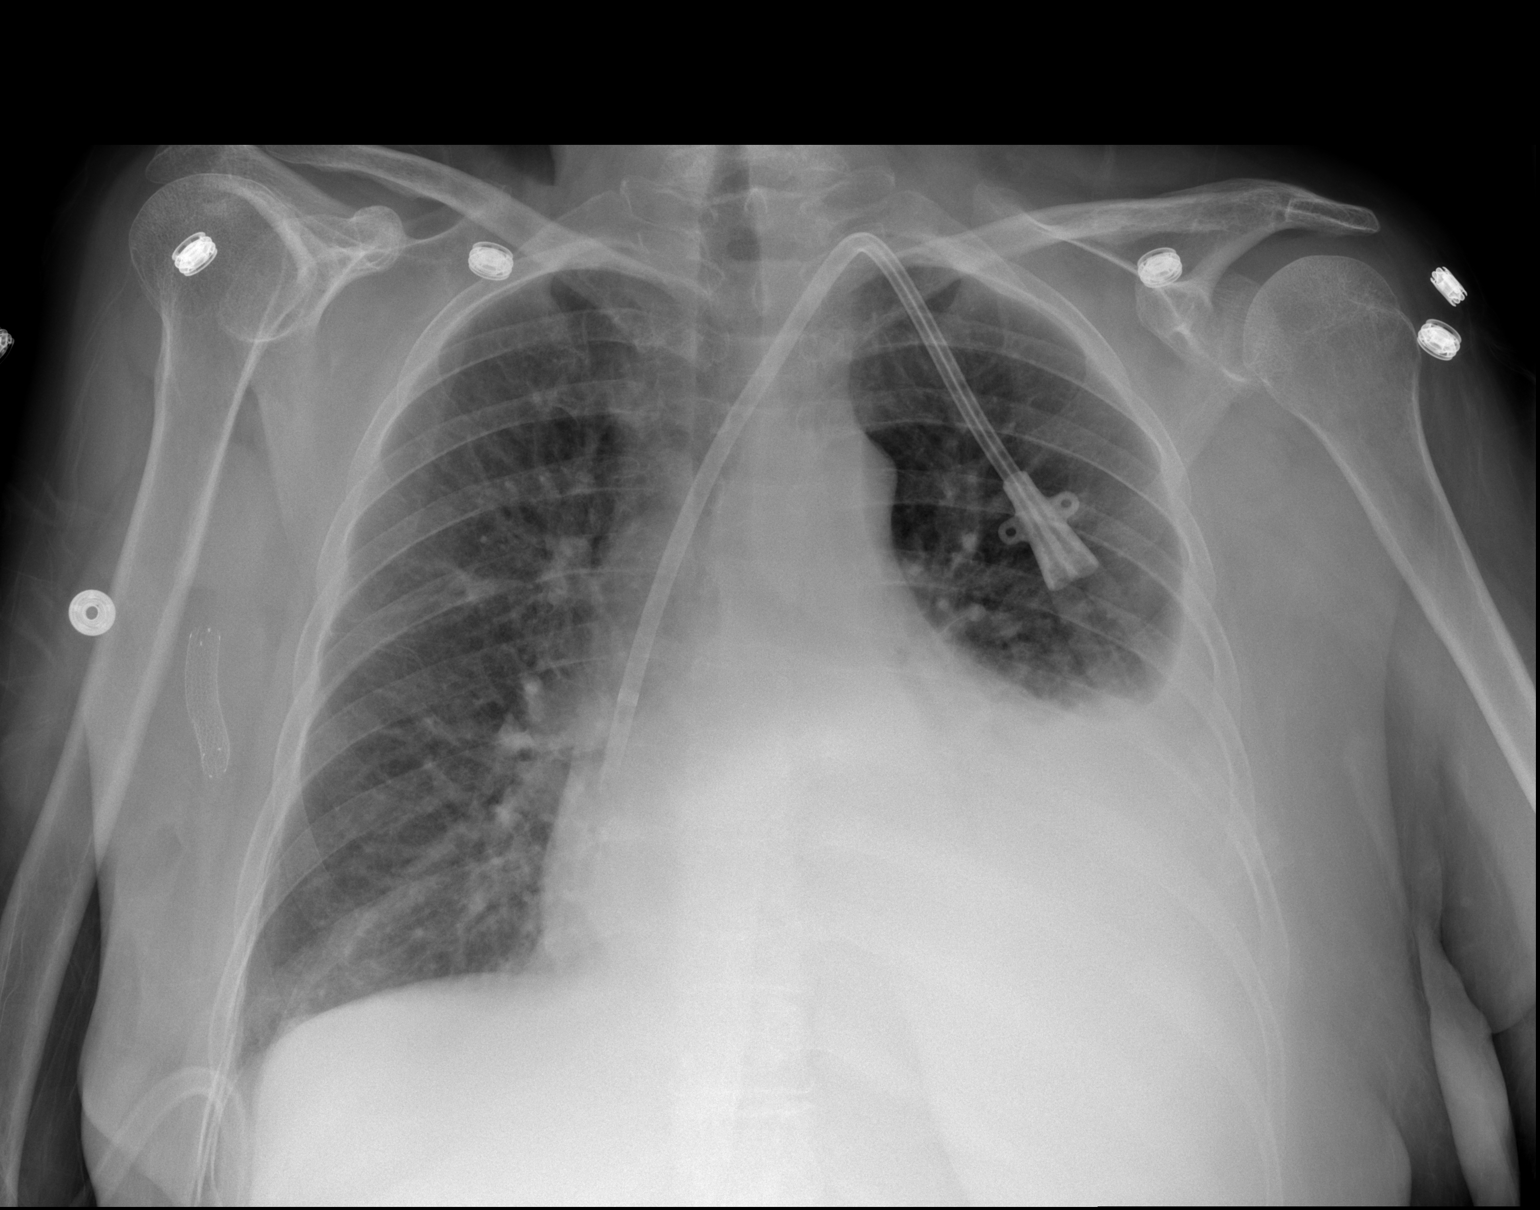

[1 of 1 positions shown; findings below may reference images not displayed]

FINDINGS: Left IJ dialysis catheter is in good position, unchanged.

Stable cardiac enlargement.

Enlarging left pleural effusion, now moderate in size and half way
up the chest. Overlying atelectasis. The right lung is clear. Mild
vascular congestion but no overt pulmonary edema.
IMPRESSION: 1. Left IJ dialysis catheter in good position.
2. Enlarging left pleural effusion since yesterday's film, now half
way up the chest.

These results will be called to the ordering clinician or
representative by the Radiologist Assistant, and communication
documented in the PACS or [REDACTED].

## 2022-02-27 IMAGING — DX DG CHEST 1V PORT
1 series · 1 of 1 positions shown · non-contrast
Comparison: Film from earlier in the same day.

CLINICAL DATA: Status post intubation

EXAM:
PORTABLE CHEST 1 VIEW

[chest ap]
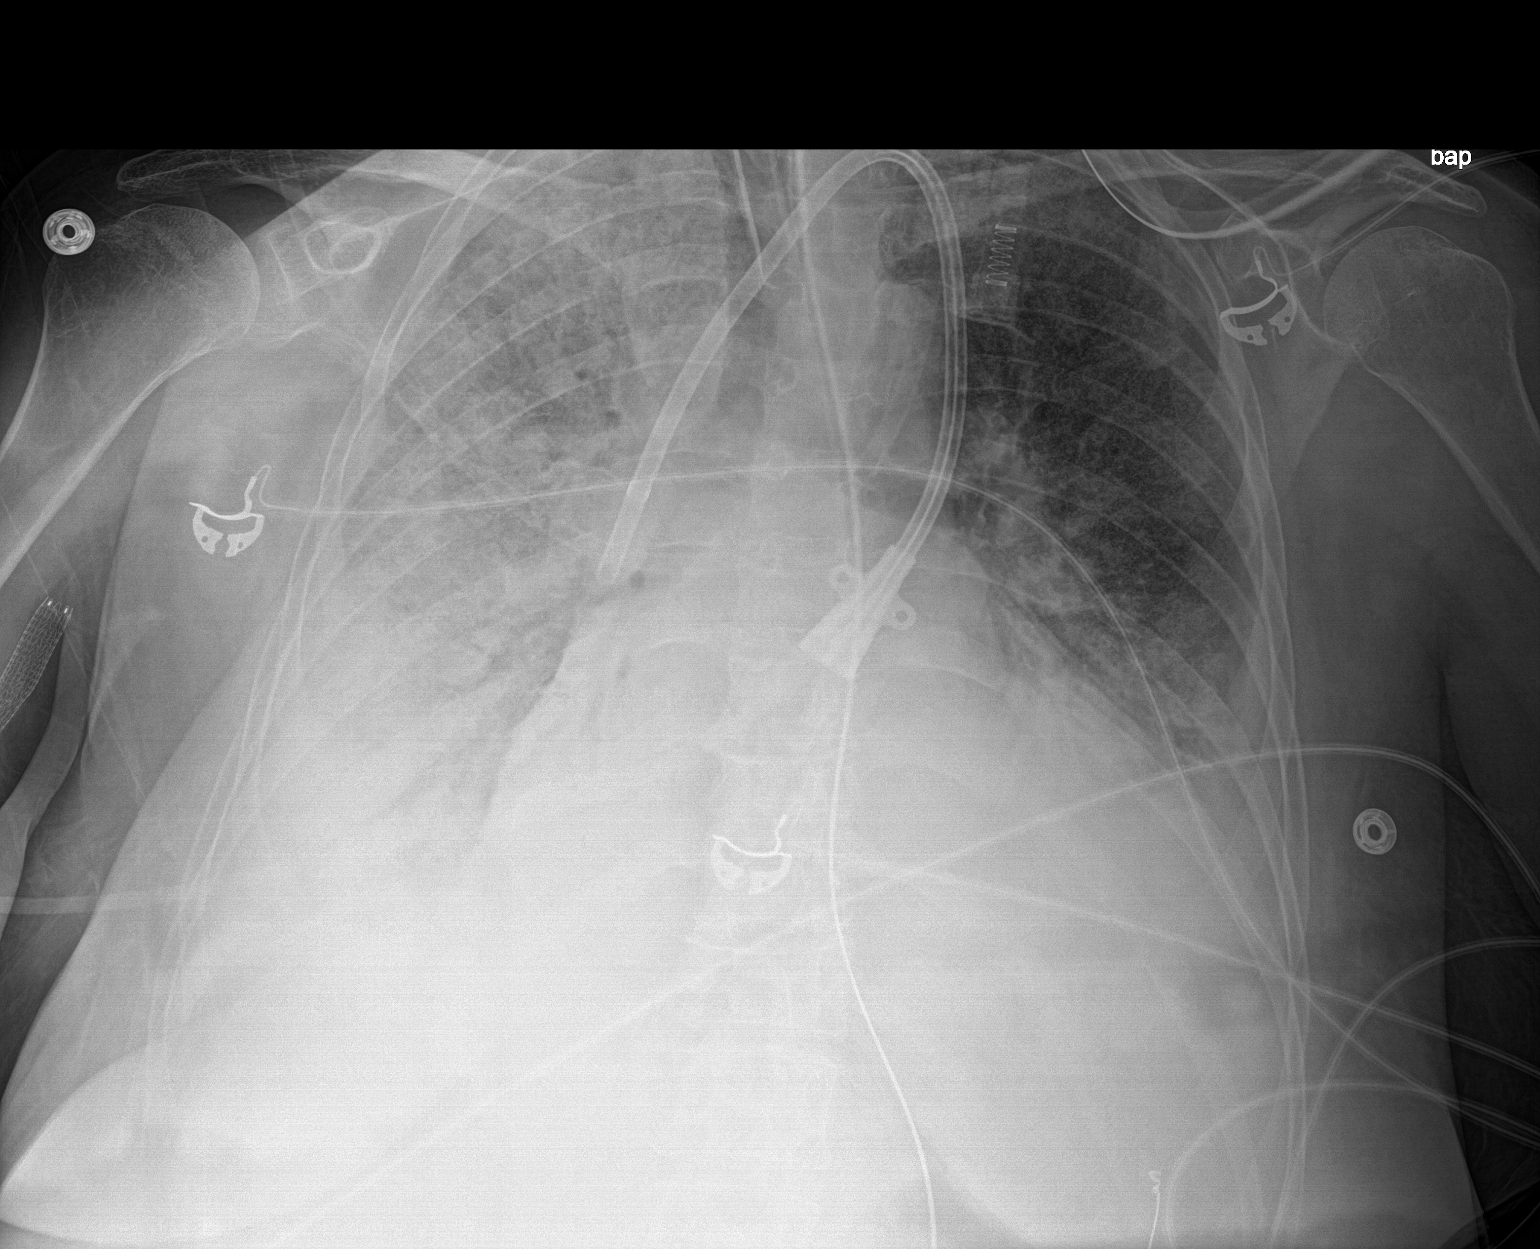

[1 of 1 positions shown; findings below may reference images not displayed]

FINDINGS: Endotracheal tube is again identified in satisfactory position.
Gastric catheter extends into the stomach. Dialysis catheter is
again seen and stable. Persistent bilateral airspace opacities are
noted right considerably greater than left similar to that seen on
prior exam as well as the recent chest CT. The known rib fractures
are not well appreciated on this exam.
IMPRESSION: Relatively stable appearance of the chest when compared with the
prior study.

## 2022-02-27 IMAGING — CT CT HEAD W/O CM
3 of 6 series · 16 of 47 positions shown, 19 images · non-contrast
Comparison: None.

CLINICAL DATA: Recent CPR and unresponsive state, initial encounter

EXAM:
CT HEAD WITHOUT CONTRAST
TECHNIQUE: Contiguous axial images were obtained from the base of the skull
through the vertex without intravenous contrast.

[Series 3: head w o · axial · 0.39mm/px · z∈[+1629,+1769]mm · 11 of 34 slices shown, 14 images]
[im 3/34  brain]
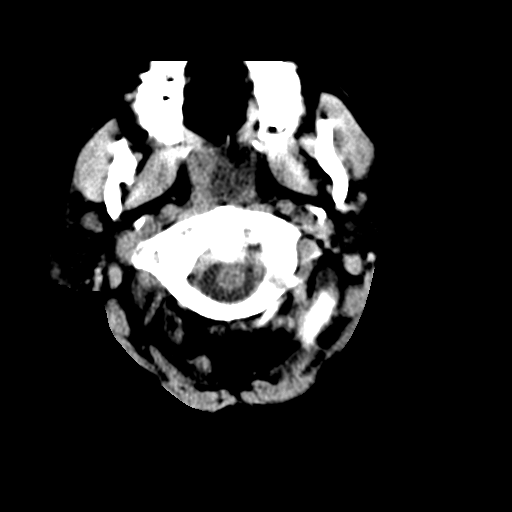
[im 3/34  bone]
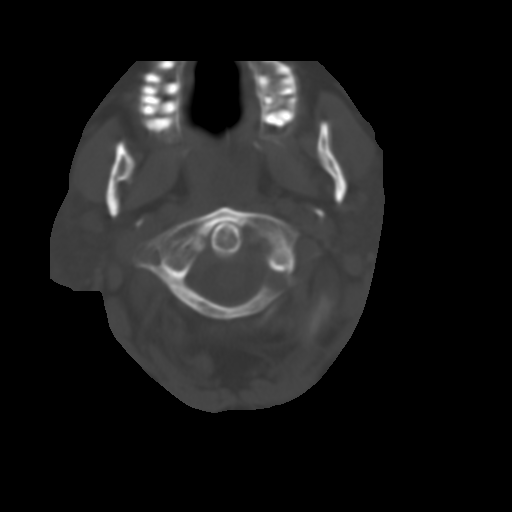
[im 5/34  brain]
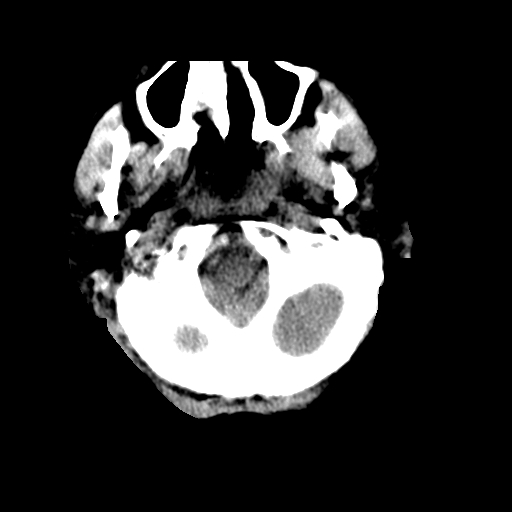
[im 8/34  brain]
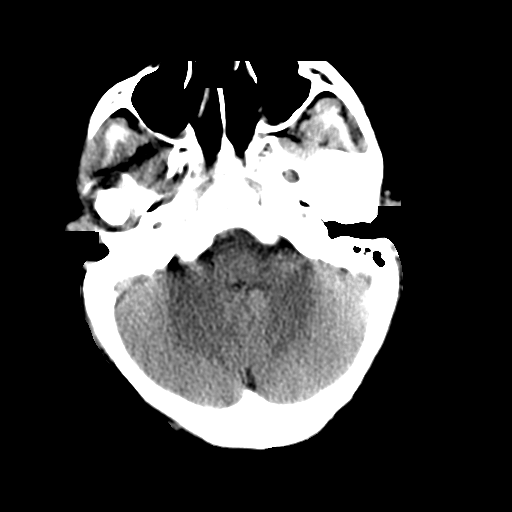
[im 12/34  brain]
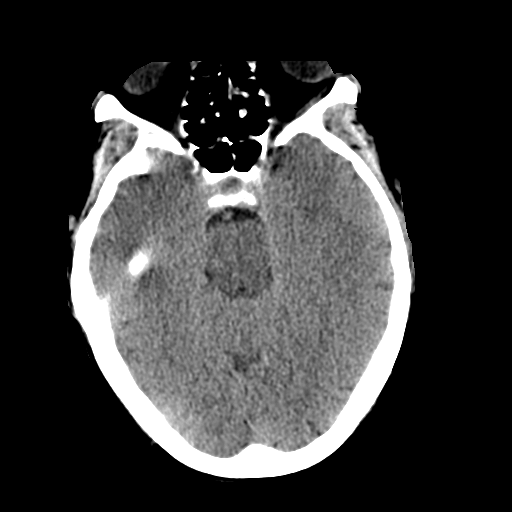
[im 15/34  brain]
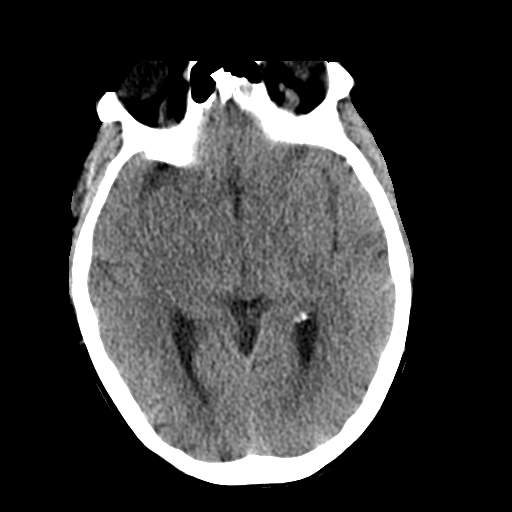
[im 15/34  bone]
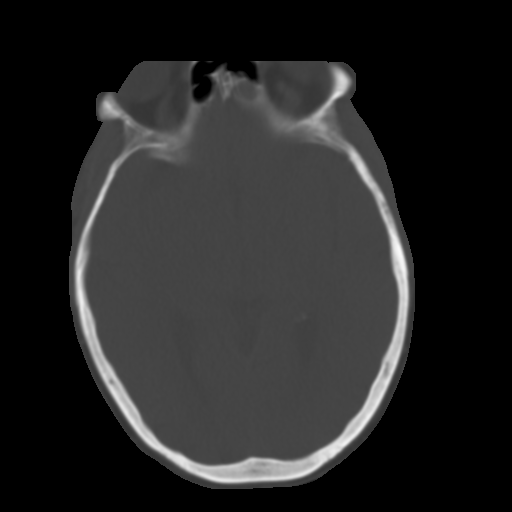
[im 17/34  brain]
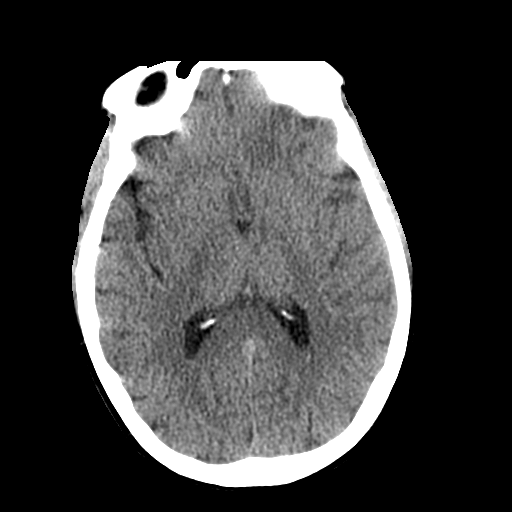
[im 19/34  brain]
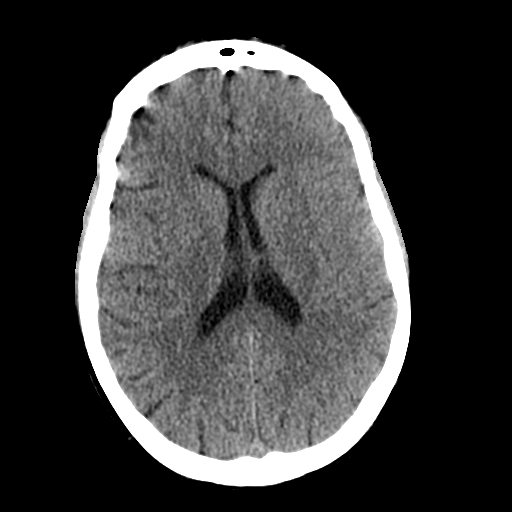
[im 22/34  brain]
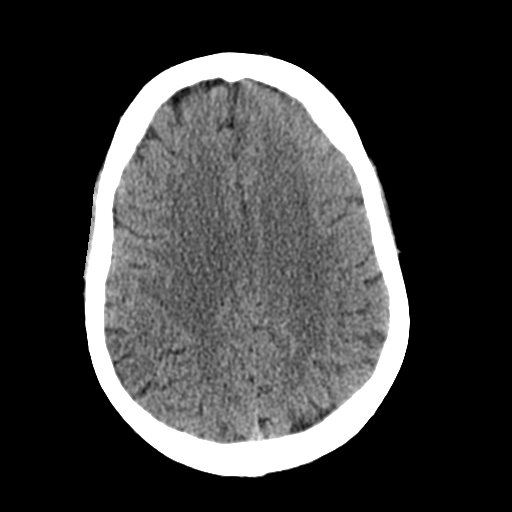
[im 26/34  brain]
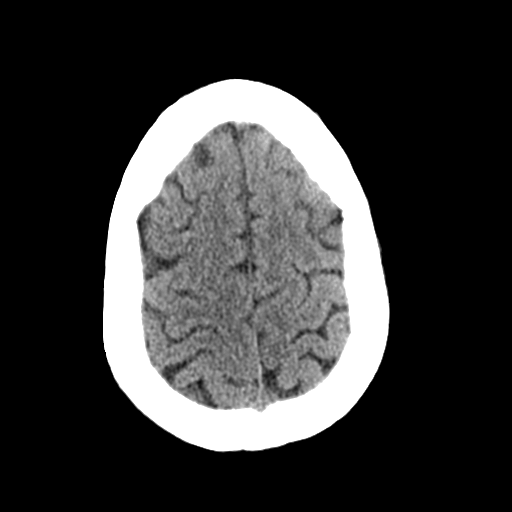
[im 26/34  bone]
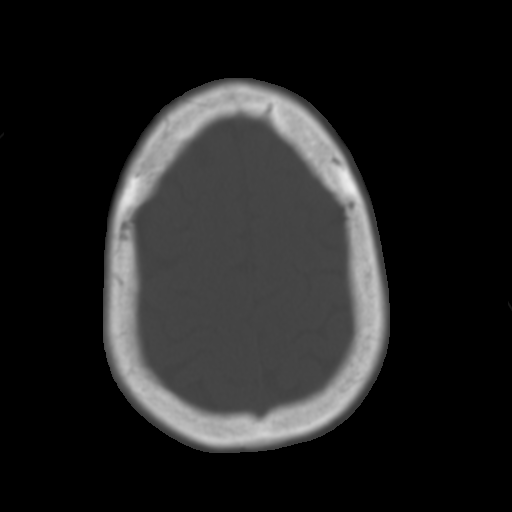
[im 29/34  brain]
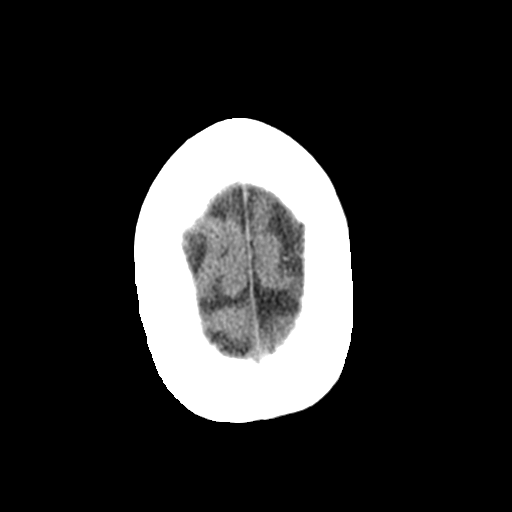
[im 31/34  brain]
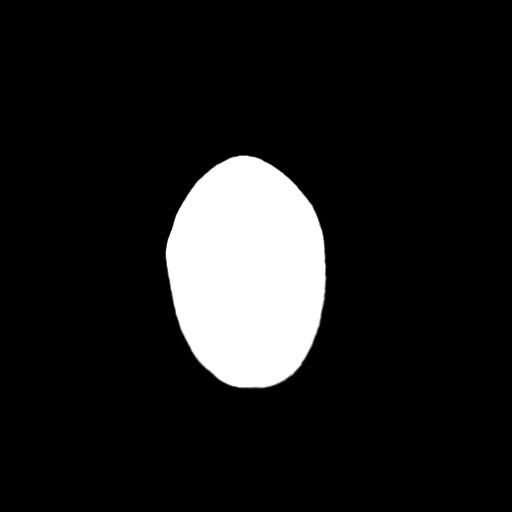

[Series 4: coronal soft · coronal · 0.35mm/px · 3 of 77 slices shown]
[im 20/77  brain]
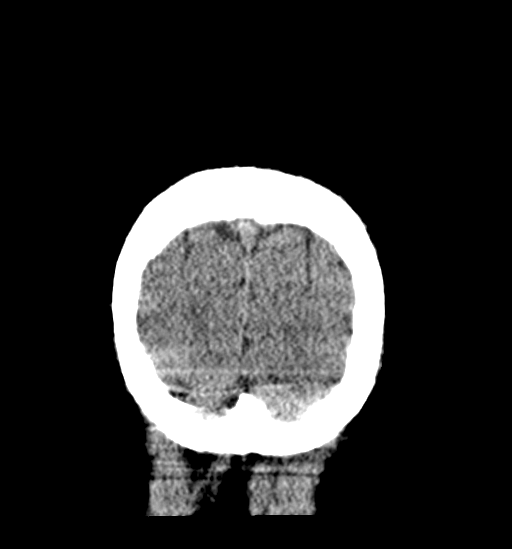
[im 39/77  brain]
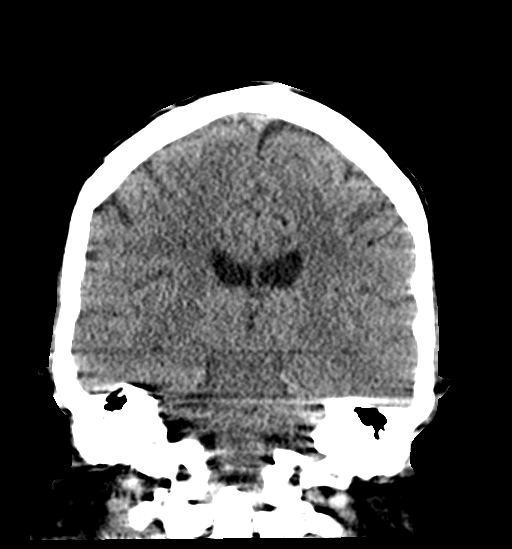
[im 58/77  brain]
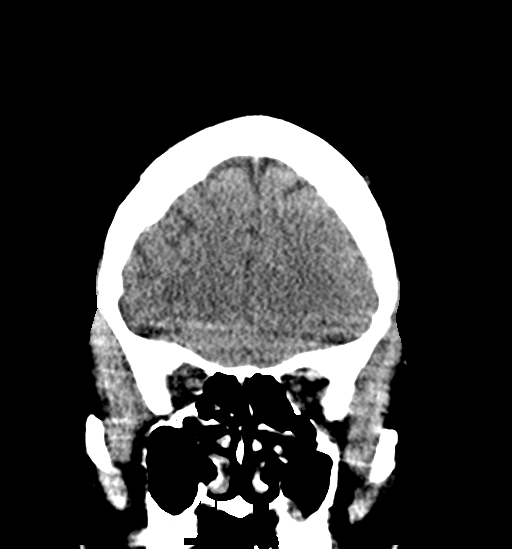

[Series 5: sagittal soft · sagittal · 0.38mm/px · 2 of 57 slices shown]
[im 19/57  brain]
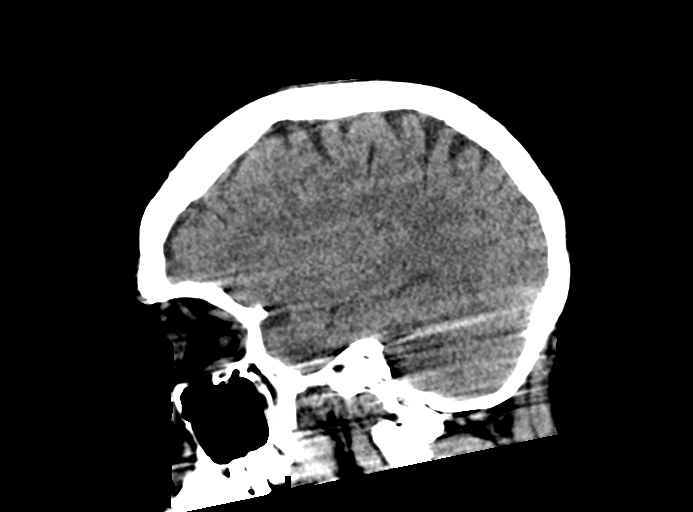
[im 38/57  brain]
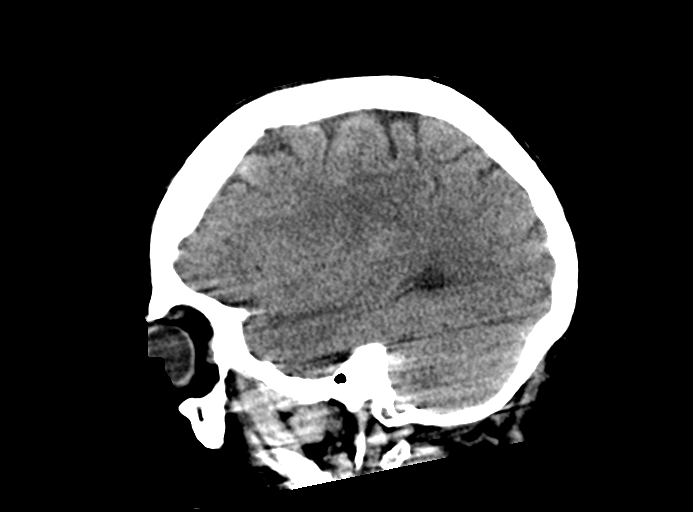

[16 of 47 positions shown; findings below may reference images not displayed]

FINDINGS: Brain: No evidence of acute infarction, hemorrhage, hydrocephalus,
extra-axial collection or mass lesion/mass effect. Mild atrophic
changes are noted.

Vascular: No hyperdense vessel or unexpected calcification.

Skull: Normal. Negative for fracture or focal lesion.

Sinuses/Orbits: Mild air-fluid levels are noted within the maxillary
and sphenoid sinuses bilaterally.

Other: None
IMPRESSION: Somewhat limited by patient motion artifact.

Mild atrophic changes.

No acute intracranial abnormality is noted.

## 2022-02-27 IMAGING — CT CT ANGIO CHEST
2 of 6 series · 18 of 46 positions shown · IV contrast (omnipaque)
Comparison: Chest x-ray from earlier in the same day, CT from
01/06/2019.

CLINICAL DATA: Status post CPR, evaluate for pulmonary embolism

EXAM:
CT ANGIOGRAPHY CHEST WITH CONTRAST
TECHNIQUE: Multidetector CT imaging of the chest was performed using the
standard protocol during bolus administration of intravenous
contrast. Multiplanar CT image reconstructions and MIPs were
obtained to evaluate the vascular anatomy.
CONTRAST:  100mL OMNIPAQUE IOHEXOL 350 MG/ML SOLN

[Series 5: pe axial thins · axial · 0.63mm/px · z∈[+1288,+1539]mm · 15 of 344 slices shown]
[im 15/344  lung]
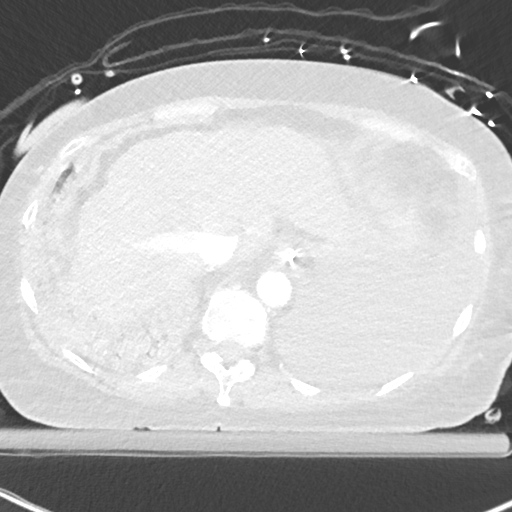
[im 43/344  soft-tissue]
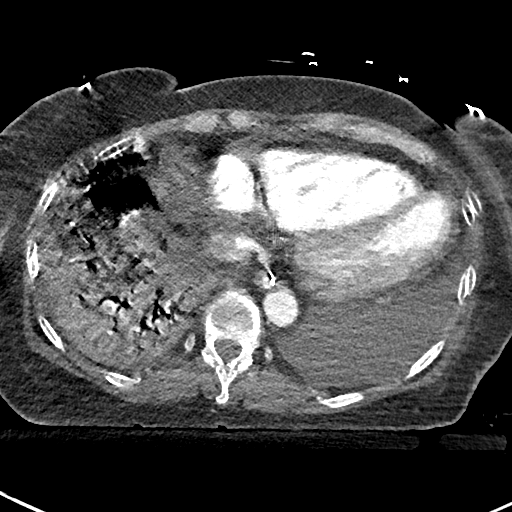
[im 58/344  lung]
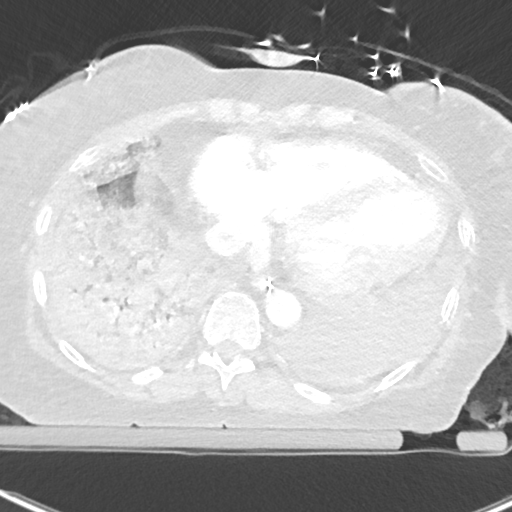
[im 86/344  soft-tissue]
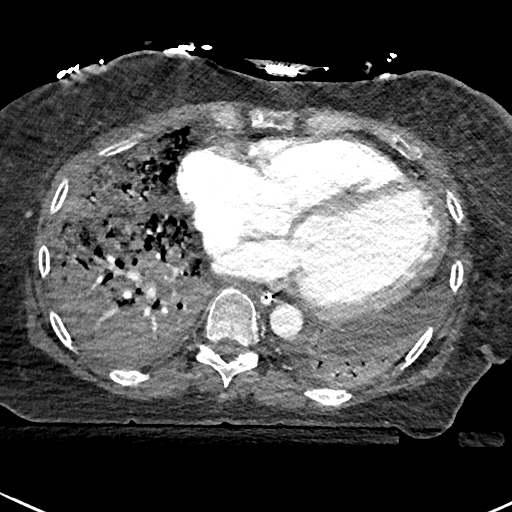
[im 101/344  lung]
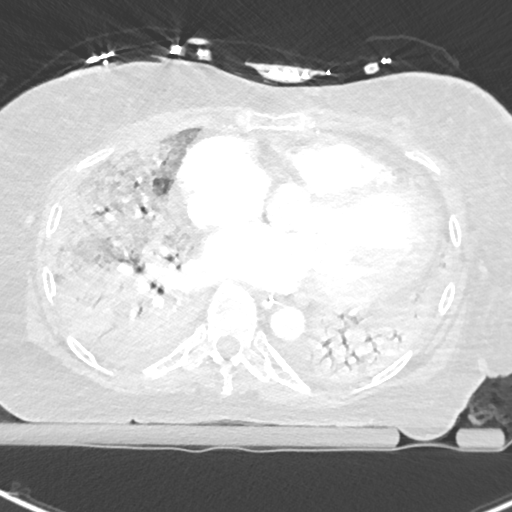
[im 129/344  soft-tissue]
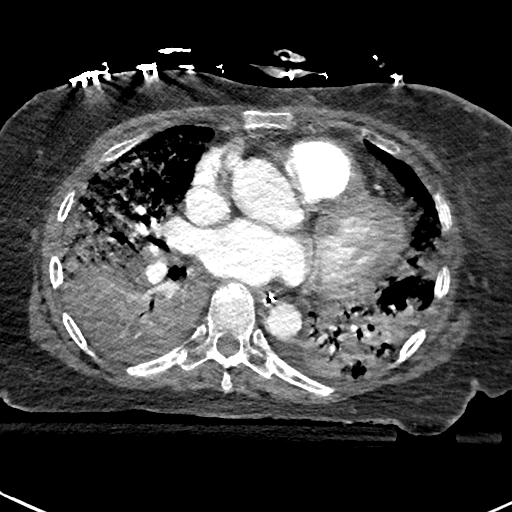
[im 143/344  lung]
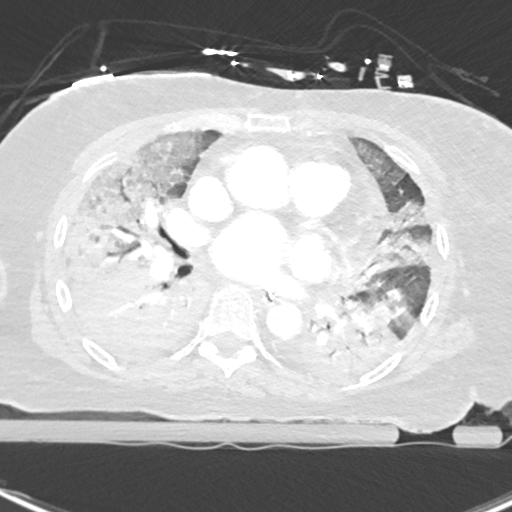
[im 172/344  soft-tissue]
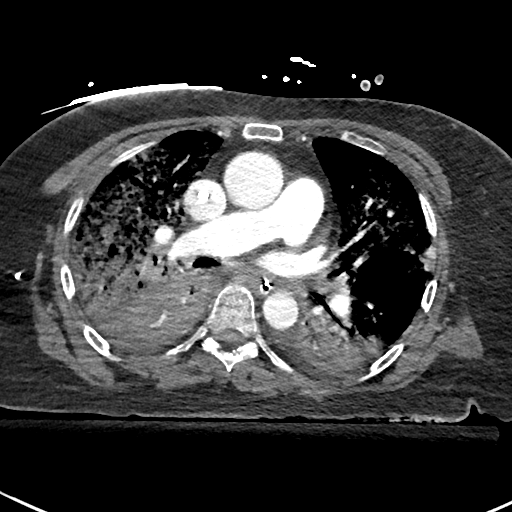
[im 201/344  lung]
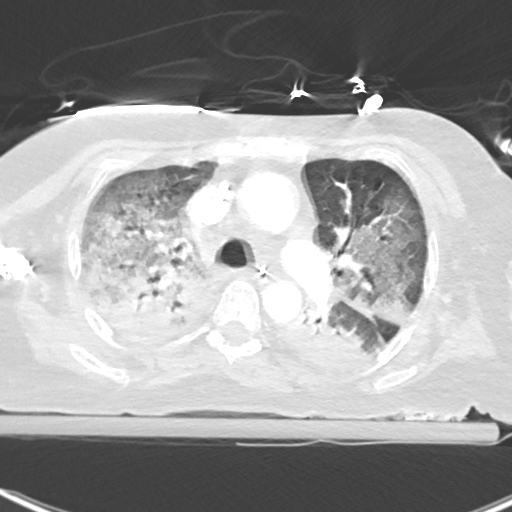
[im 215/344  soft-tissue]
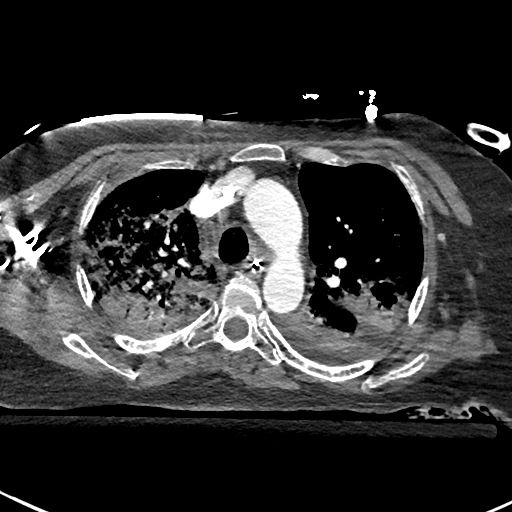
[im 243/344  lung]
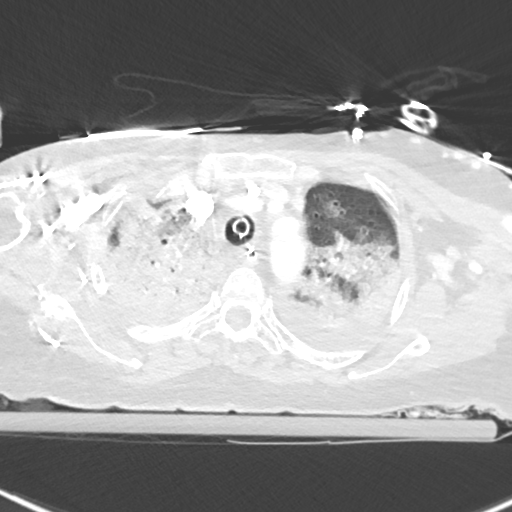
[im 258/344  soft-tissue]
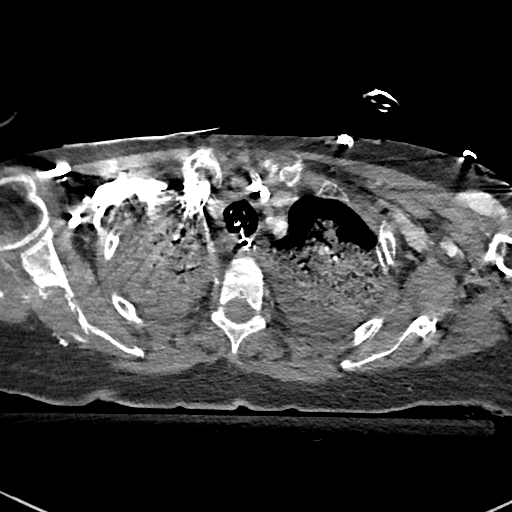
[im 286/344  lung]
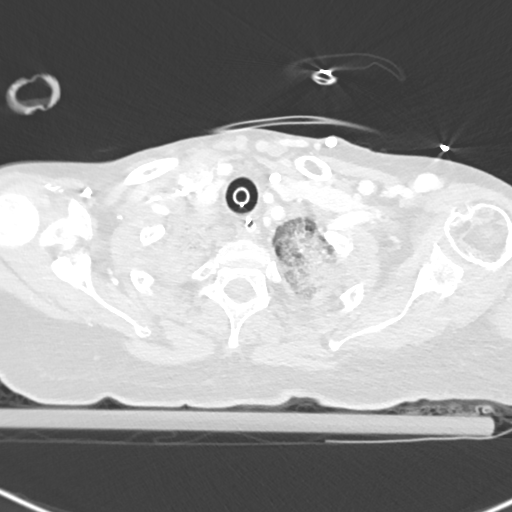
[im 301/344  soft-tissue]
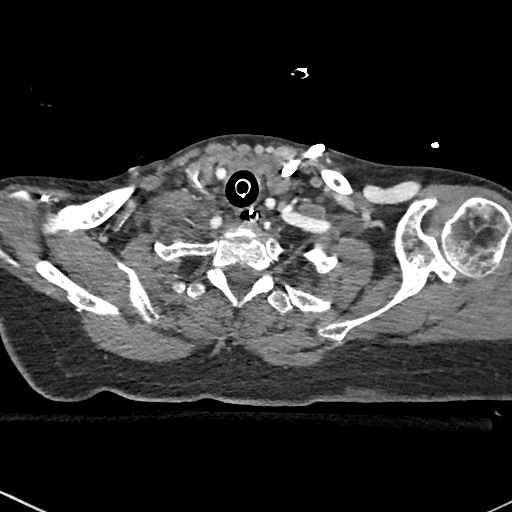
[im 329/344  lung]
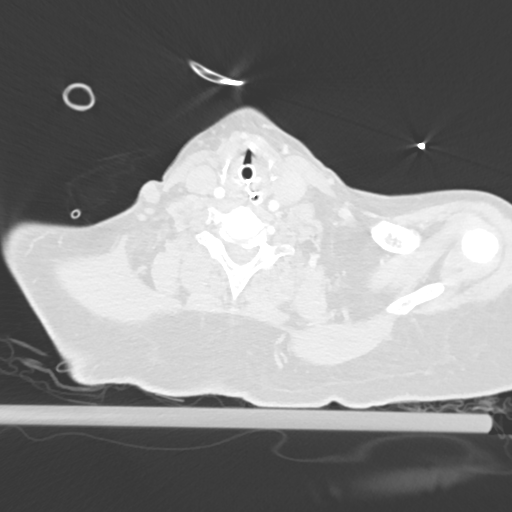

[Series 7: cor soft · coronal · 0.58mm/px · 3 of 126 slices shown]
[im 32/126  soft-tissue]
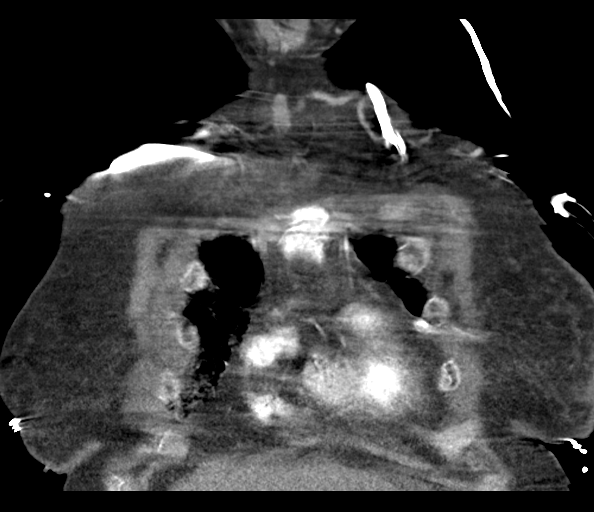
[im 63/126  soft-tissue]
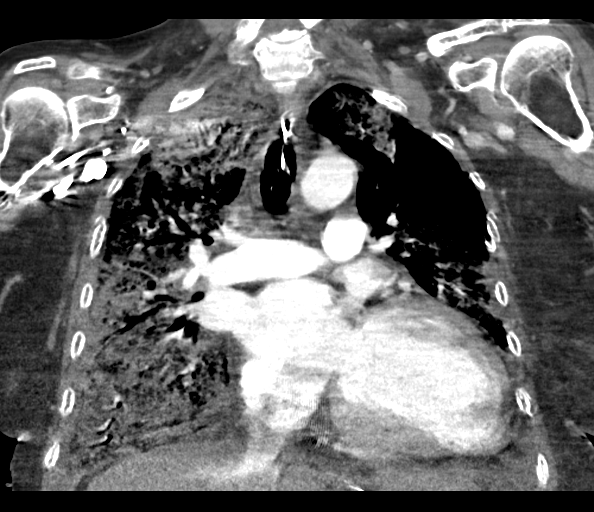
[im 94/126  soft-tissue]
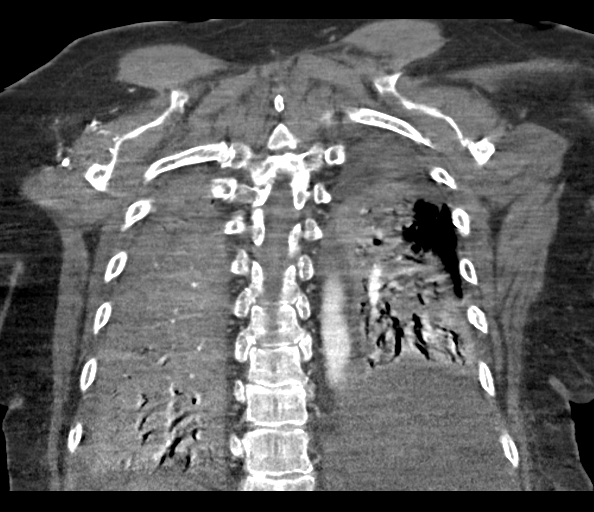

[18 of 46 positions shown; findings below may reference images not displayed]

FINDINGS: Cardiovascular: Thoracic aorta demonstrates a normal branching
pattern. No aneurysmal dilatation or dissection is seen. Heart is
enlarged in size. Pulmonary artery is well visualized with a normal
branching pattern. Mild motion artifact somewhat limits the distal
branches although no pulmonary emboli are identified. No significant
coronary calcifications are noted. Left jugular dialysis catheter is
noted in satisfactory position.

Mediastinum/Nodes: Thoracic inlet is within normal limits.
Endotracheal tube and gastric catheter are noted in satisfactory
position. The esophagus as visualized is within normal limits. No
sizable hilar or mediastinal adenopathy is noted.

Lungs/Pleura: Lungs demonstrate diffuse multifocal consolidation
worst in the bases bilaterally. Large left pleural effusion is
noted. Parenchymal edema is identified right greater than left
similar to that seen on recent plain film examination. No
pneumothorax is noted.

Upper Abdomen: Visualized upper abdomen demonstrates some nodularity
of the liver suggestive of underlying cirrhosis. Remainder of the
upper abdomen is within normal limits.

Musculoskeletal: Motion artifact somewhat limits evaluation although
there are a few anterior rib fractures identified on the right
involving the second, third, fourth and fifth ribs. Similar
angulated rib fractures are noted anteriorly on the left. Stable T10
compression deformity is noted unchanged from 0505.

Review of the MIP images confirms the above findings.
IMPRESSION: Bilateral consolidation primarily in the lower lobes with associated
diffuse pulmonary edema right greater than left similar to that seen
on prior plain film.

Large left pleural effusion.

No evidence of pulmonary emboli.

Changes consistent with cirrhosis of the liver.

Tubes and lines in satisfactory position.

Multiple rib fractures related to the known CPR.

## 2022-03-02 IMAGING — DX DG CHEST 1V
1 series · 1 of 1 positions shown · non-contrast
Comparison: 03/07/2021

CLINICAL DATA: Pneumonia

EXAM:
CHEST  1 VIEW

[chest ap]
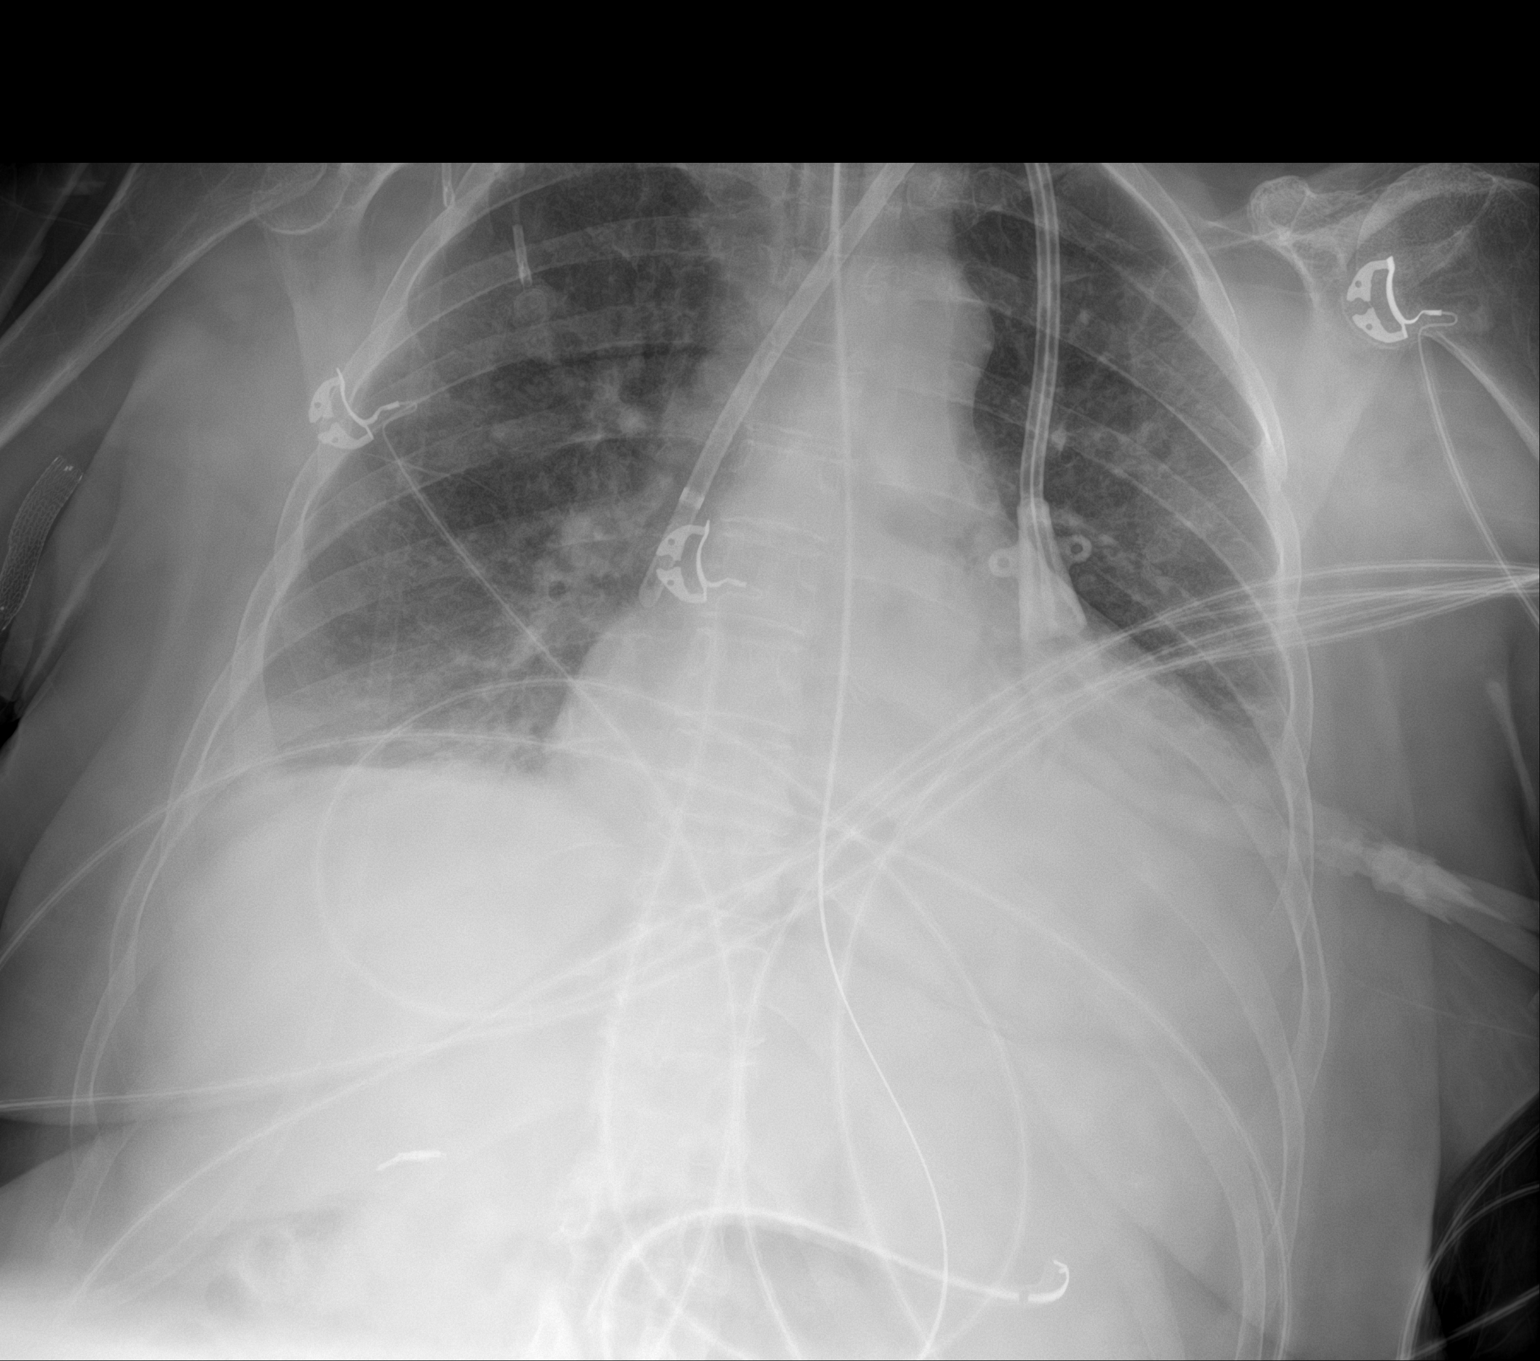

[1 of 1 positions shown; findings below may reference images not displayed]

FINDINGS: Endotracheal tube remains in good position. NG tube coiled in the
body the stomach. Right jugular central venous catheter tip at the
cavoatrial junction unchanged.

Significant improvement in extensive airspace disease right greater
than left. There remains bibasilar airspace disease and small
bilateral effusions. No pneumothorax.
IMPRESSION: Significant improvement in airspace consolidation since the prior
study

There remains bibasilar atelectasis and small effusions, left
greater than right.

## 2022-03-03 IMAGING — MR MR HEAD W/O CM
9 of 10 series · 35 of 48 positions shown · non-contrast
Comparison: Head CT 03/07/2021 and MRI 12/16/2015

CLINICAL DATA: Status post cardiac arrest.

EXAM:
MRI HEAD WITHOUT CONTRAST
TECHNIQUE: Multiplanar, multiecho pulse sequences of the brain and surrounding
structures were obtained without intravenous contrast.

[Series 3: DWI · axial · 3.0mm · 1.09mm/px · z∈[-58,+93]mm · 8 of 104 slices shown (1 of 4)]
[im 1/104]
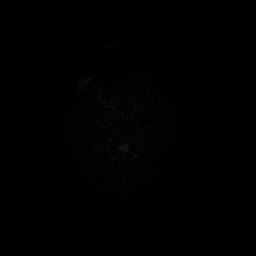
[im 12/104]
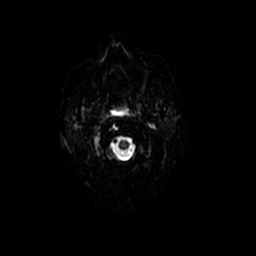
[im 35/104]
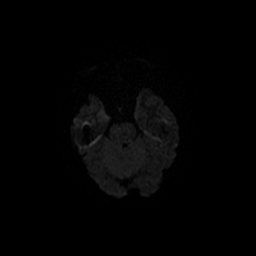
[im 46/104]
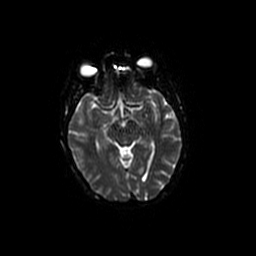
[im 58/104]
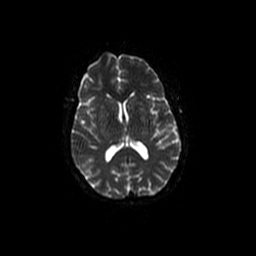
[im 69/104]
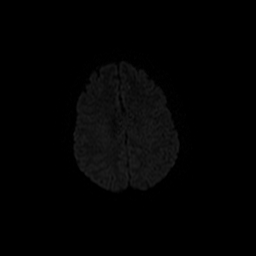
[im 92/104]
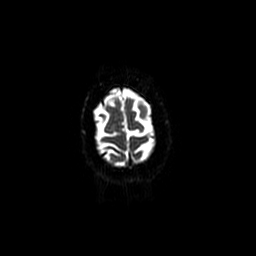
[im 104/104]
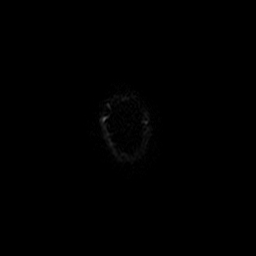

[Series 4: DWI · coronal · 5.0mm · 1.09mm/px · 7 of 72 slices shown (2 of 4)]
[im 1/72]
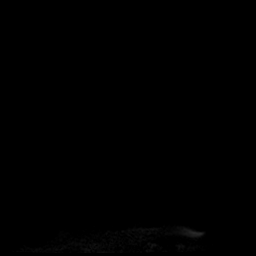
[im 12/72]
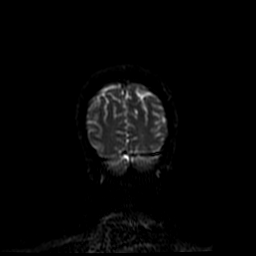
[im 24/72]
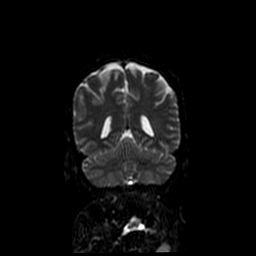
[im 36/72]
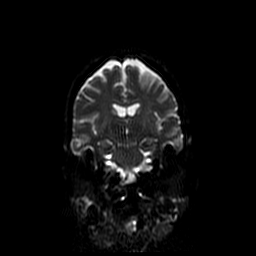
[im 48/72]
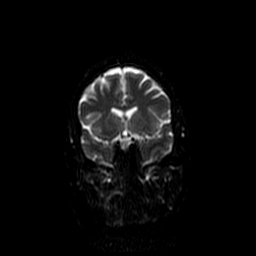
[im 60/72]
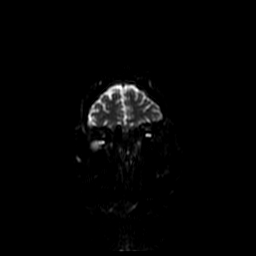
[im 72/72]
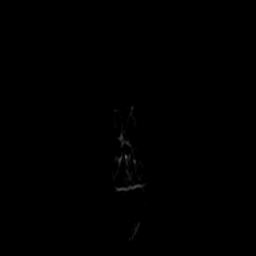

[Series 5: FLAIR · axial · 3.0mm · 0.43mm/px · z∈[-57,+91]mm · 3 of 26 slices shown]
[im 1/26]
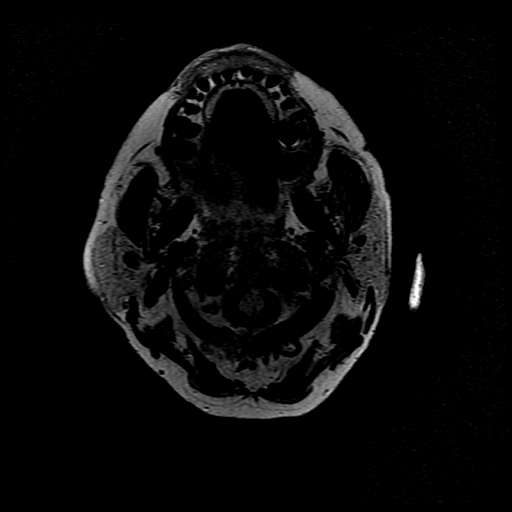
[im 13/26]
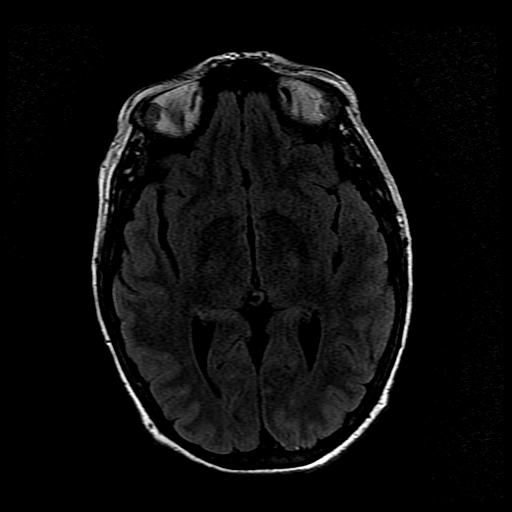
[im 26/26]
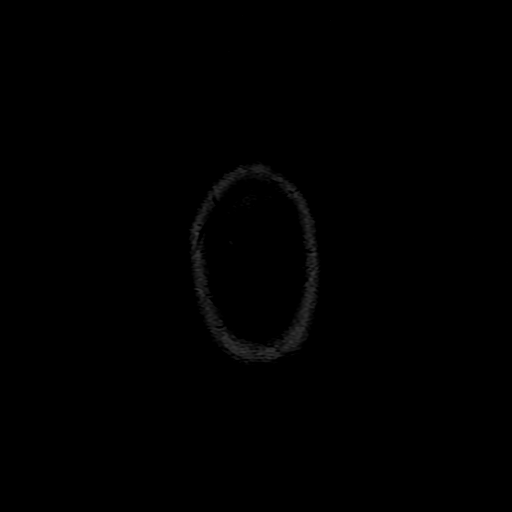

[Series 7: T1 · sagittal · 5.0mm · 0.47mm/px · 2 of 25 slices shown (1 of 2)]
[im 1/25]
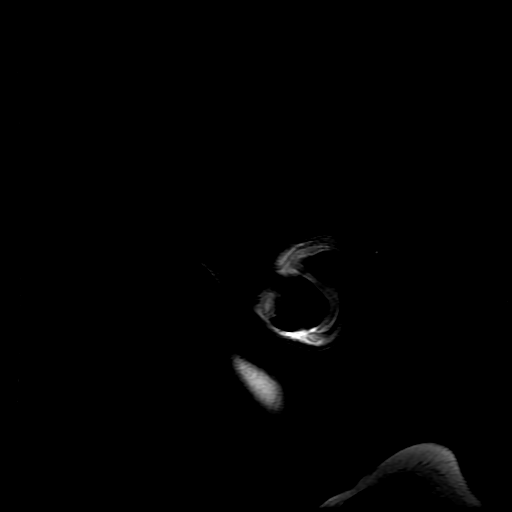
[im 25/25]
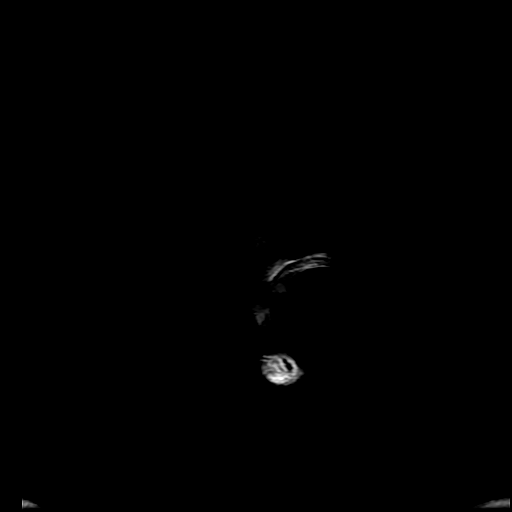

[Series 8: T2 · axial · 5.0mm · 0.43mm/px · z∈[-57,+91]mm · 3 of 26 slices shown (1 of 2)]
[im 1/26]
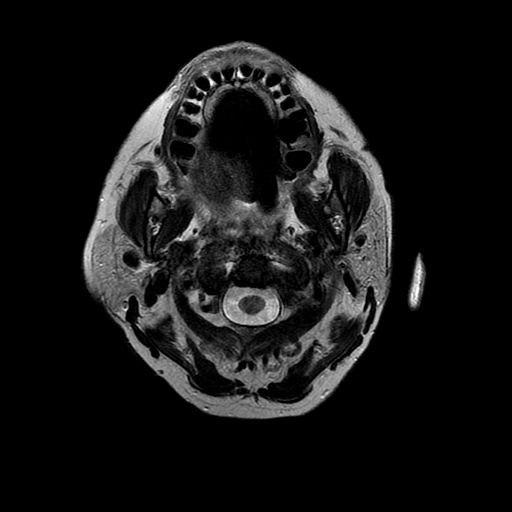
[im 13/26]
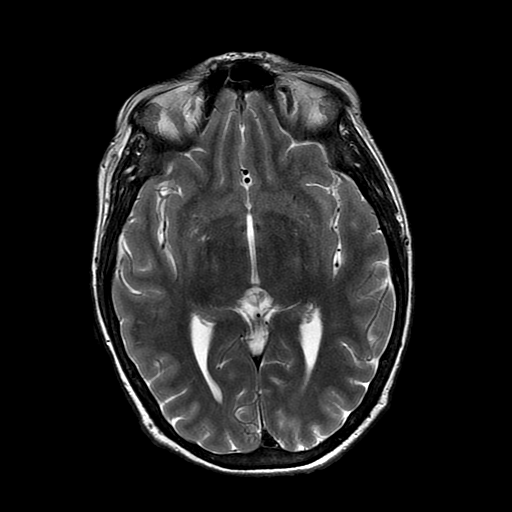
[im 26/26]
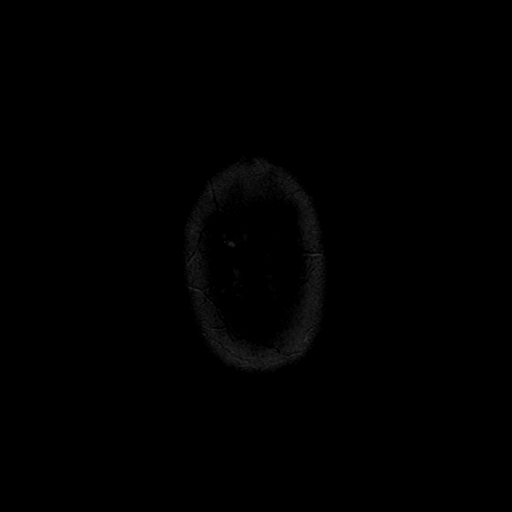

[Series 9: T1 · axial · 3.0mm · 0.47mm/px · 1 of 104 slices shown (2 of 2)]
[im 1/104]
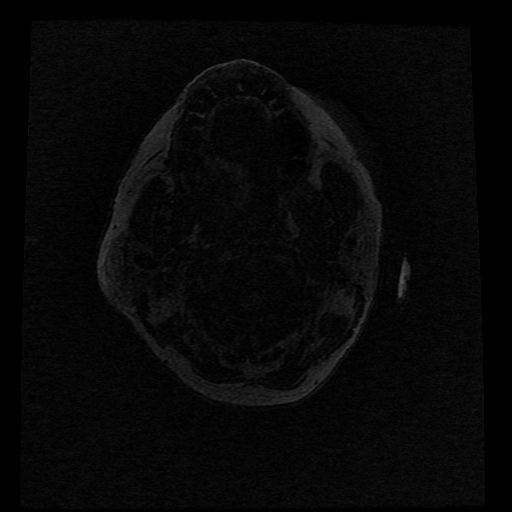

[Series 10: T2 · coronal · 5.0mm · 0.39mm/px · 3 of 28 slices shown (2 of 2)]
[im 1/28]
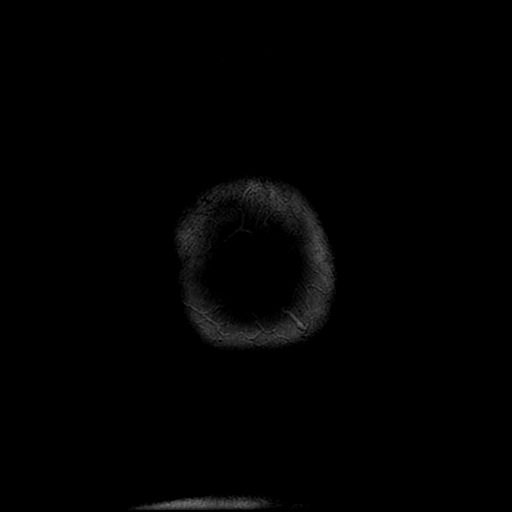
[im 14/28]
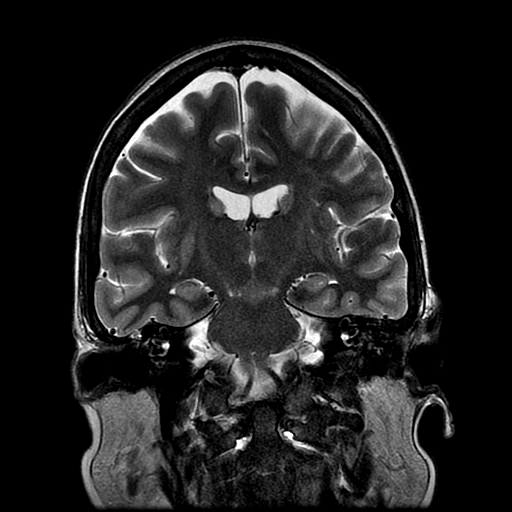
[im 28/28]
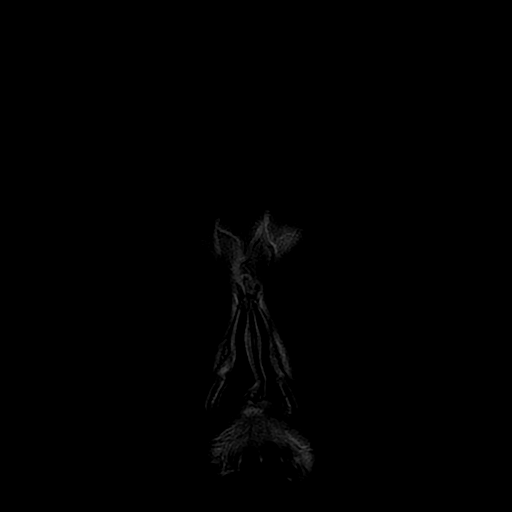

[Series 300: DWI · axial · 3.0mm · 1.09mm/px · z∈[-58,+93]mm · 5 of 52 slices shown (3 of 4)]
[im 1/52]
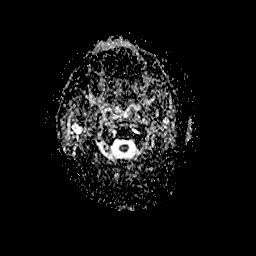
[im 13/52]
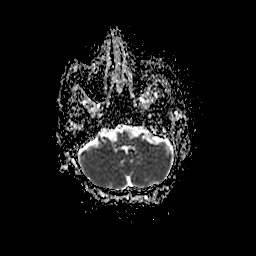
[im 26/52]
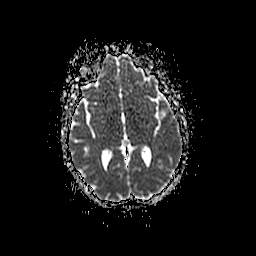
[im 39/52]
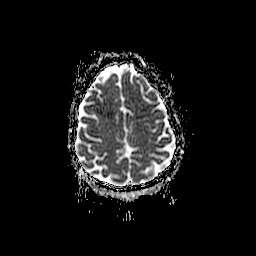
[im 52/52]
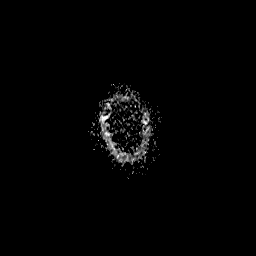

[Series 400: DWI · coronal · 5.0mm · 1.09mm/px · 3 of 36 slices shown (4 of 4)]
[im 1/36]
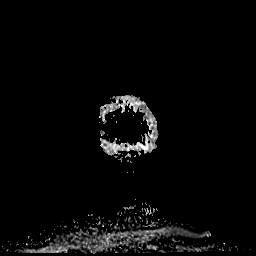
[im 18/36]
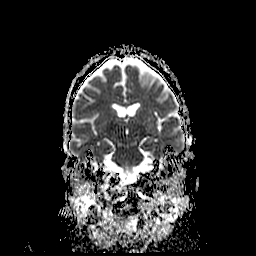
[im 36/36]
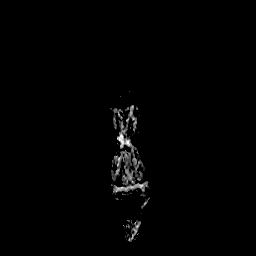

[35 of 48 positions shown; findings below may reference images not displayed]

FINDINGS: Brain: There is subtle T2 and diffusion weighted signal
hyperintensity in the basal ganglia bilaterally, slightly more
conspicuous on the right than the left. No definite cortical
restricted diffusion or swelling is seen.

There are small chronic cortical infarcts in the right frontal lobe
and left frontoparietal region. Scattered small T2 hyperintensities
elsewhere in the cerebral white matter bilaterally are nonspecific
but compatible with mild chronic small vessel ischemic disease with
a chronic lacunar infarct noted in the left corona radiata. A focus
of chronic hemorrhage in the left cerebellar tonsil is unchanged
from the prior MRI. No acute intracranial hemorrhage, mass, midline
shift, or extra-axial fluid collection is identified. The ventricles
are normal in size.

Vascular: Major intracranial vascular flow voids are preserved.

Skull and upper cervical spine: Unremarkable bone marrow signal.

Sinuses/Orbits: Bilateral cataract extraction. Mild left greater
than right ethmoid sinus mucosal thickening. Trace bilateral mastoid
fluid.

Other: None.
IMPRESSION: 1. Mild signal abnormality in the basal ganglia bilaterally
suggesting hypoxic-ischemic injury.
2. Mild chronic small vessel ischemic disease with small chronic
infarcts as above.
